# Patient Record
Sex: Male | Born: 1950
Health system: Southern US, Community
[De-identification: ages and names within clinical notes are randomized; demographics above are authoritative.]

## PROBLEM LIST (undated history)

## (undated) DIAGNOSIS — C189 Malignant neoplasm of colon, unspecified: Secondary | ICD-10-CM

## (undated) DIAGNOSIS — C20 Malignant neoplasm of rectum: Secondary | ICD-10-CM

## (undated) DIAGNOSIS — E119 Type 2 diabetes mellitus without complications: Secondary | ICD-10-CM

## (undated) DIAGNOSIS — C7800 Secondary malignant neoplasm of unspecified lung: Secondary | ICD-10-CM

## (undated) DIAGNOSIS — K219 Gastro-esophageal reflux disease without esophagitis: Secondary | ICD-10-CM

## (undated) DIAGNOSIS — J189 Pneumonia, unspecified organism: Secondary | ICD-10-CM

## (undated) DIAGNOSIS — R198 Other specified symptoms and signs involving the digestive system and abdomen: Secondary | ICD-10-CM

## (undated) DIAGNOSIS — G629 Polyneuropathy, unspecified: Secondary | ICD-10-CM

## (undated) DIAGNOSIS — Z7901 Long term (current) use of anticoagulants: Secondary | ICD-10-CM

## (undated) DIAGNOSIS — Z87442 Personal history of urinary calculi: Secondary | ICD-10-CM

## (undated) DIAGNOSIS — I2699 Other pulmonary embolism without acute cor pulmonale: Secondary | ICD-10-CM

## (undated) DIAGNOSIS — Z932 Ileostomy status: Principal | ICD-10-CM

## (undated) DIAGNOSIS — I739 Peripheral vascular disease, unspecified: Secondary | ICD-10-CM

## (undated) DIAGNOSIS — I10 Essential (primary) hypertension: Secondary | ICD-10-CM

## (undated) DIAGNOSIS — Z923 Personal history of irradiation: Secondary | ICD-10-CM

## (undated) DIAGNOSIS — I82409 Acute embolism and thrombosis of unspecified deep veins of unspecified lower extremity: Secondary | ICD-10-CM

## (undated) DIAGNOSIS — F329 Major depressive disorder, single episode, unspecified: Secondary | ICD-10-CM

## (undated) HISTORY — DX: Personal history of irradiation: Z92.3

## (undated) HISTORY — DX: Acute embolism and thrombosis of unspecified deep veins of unspecified lower extremity: I82.409

## (undated) HISTORY — DX: Other pulmonary embolism without acute cor pulmonale: I26.99

## (undated) HISTORY — PX: HERNIA REPAIR: SHX51

## (undated) HISTORY — DX: Ileostomy status: Z93.2

## (undated) HISTORY — DX: Other specified symptoms and signs involving the digestive system and abdomen: R19.8

## (undated) HISTORY — DX: Major depressive disorder, single episode, unspecified: F32.9

## (undated) HISTORY — DX: Essential (primary) hypertension: I10

## (undated) HISTORY — DX: Gastro-esophageal reflux disease without esophagitis: K21.9

## (undated) HISTORY — DX: Secondary malignant neoplasm of unspecified lung: C78.00

## (undated) HISTORY — DX: Malignant neoplasm of colon, unspecified: C18.9

## (undated) HISTORY — DX: Malignant neoplasm of rectum: C20

---

## 2010-07-04 HISTORY — PX: ESOPHAGOGASTRODUODENOSCOPY: SHX1529

## 2010-07-04 HISTORY — PX: COLONOSCOPY: SHX174

## 2010-07-11 ENCOUNTER — Ambulatory Visit (INDEPENDENT_AMBULATORY_CARE_PROVIDER_SITE_OTHER): Payer: Self-pay | Admitting: Gastroenterology

## 2010-07-11 ENCOUNTER — Encounter: Payer: Self-pay | Admitting: Gastroenterology

## 2010-07-11 DIAGNOSIS — K219 Gastro-esophageal reflux disease without esophagitis: Secondary | ICD-10-CM

## 2010-07-11 DIAGNOSIS — R197 Diarrhea, unspecified: Secondary | ICD-10-CM | POA: Insufficient documentation

## 2010-07-11 DIAGNOSIS — K625 Hemorrhage of anus and rectum: Secondary | ICD-10-CM

## 2010-07-13 ENCOUNTER — Encounter: Payer: Self-pay | Admitting: Gastroenterology

## 2010-07-16 ENCOUNTER — Telehealth (INDEPENDENT_AMBULATORY_CARE_PROVIDER_SITE_OTHER): Payer: Self-pay

## 2010-07-16 ENCOUNTER — Emergency Department (HOSPITAL_COMMUNITY)
Admission: EM | Admit: 2010-07-16 | Discharge: 2010-07-16 | Disposition: A | Discharge status: Home / Self Care | Payer: Self-pay | Attending: Emergency Medicine | Admitting: Emergency Medicine

## 2010-07-16 ENCOUNTER — Encounter: Payer: Self-pay | Admitting: Internal Medicine

## 2010-07-16 DIAGNOSIS — R0602 Shortness of breath: Secondary | ICD-10-CM | POA: Insufficient documentation

## 2010-07-16 DIAGNOSIS — R5381 Other malaise: Secondary | ICD-10-CM | POA: Insufficient documentation

## 2010-07-16 DIAGNOSIS — Z79899 Other long term (current) drug therapy: Secondary | ICD-10-CM | POA: Insufficient documentation

## 2010-07-16 DIAGNOSIS — R5383 Other fatigue: Secondary | ICD-10-CM | POA: Insufficient documentation

## 2010-07-16 DIAGNOSIS — K922 Gastrointestinal hemorrhage, unspecified: Secondary | ICD-10-CM | POA: Insufficient documentation

## 2010-07-16 DIAGNOSIS — I10 Essential (primary) hypertension: Secondary | ICD-10-CM | POA: Insufficient documentation

## 2010-07-16 LAB — CK TOTAL AND CKMB (NOT AT ARMC)
CK, MB: 0.8 ng/mL (ref 0.3–4.0)
Relative Index: INVALID (ref 0.0–2.5)
Total CK: 54 U/L (ref 7–232)

## 2010-07-16 LAB — BASIC METABOLIC PANEL
BUN: 14 mg/dL (ref 6–23)
CO2: 25 mEq/L (ref 19–32)
Calcium: 9.2 mg/dL (ref 8.4–10.5)
Chloride: 102 mEq/L (ref 96–112)
Creatinine, Ser: 0.73 mg/dL (ref 0.4–1.5)
GFR calc Af Amer: >60 mL/min (ref >60)
GFR calc non Af Amer: >60 mL/min (ref >60)
Glucose, Bld: 87 mg/dL (ref 70–99)
Potassium: 4.1 mEq/L (ref 3.5–5.1)
Sodium: 138 mEq/L (ref 135–145)

## 2010-07-16 LAB — TROPONIN I: Troponin I: 0.01 ng/mL (ref 0.00–0.06)

## 2010-07-16 LAB — DIFFERENTIAL
Basophils Absolute: 0.1 10*3/uL (ref 0.0–0.1)
Basophils Relative: 1 % (ref 0–1)
Eosinophils Absolute: 0.3 10*3/uL (ref 0.0–0.7)
Eosinophils Relative: 3 % (ref 0–5)
Lymphocytes Relative: 41 % (ref 12–46)
Lymphs Abs: 3.6 10*3/uL (ref 0.7–4.0)
Monocytes Absolute: 0.7 10*3/uL (ref 0.1–1.0)
Monocytes Relative: 8 % (ref 3–12)
Neutro Abs: 4.1 10*3/uL (ref 1.7–7.7)
Neutrophils Relative %: 47 % (ref 43–77)

## 2010-07-16 LAB — APTT: aPTT: 29 seconds (ref 24–37)

## 2010-07-16 LAB — CBC
HCT: 33.5 % — ABNORMAL LOW (ref 39.0–52.0)
Hemoglobin: 10.2 g/dL — ABNORMAL LOW (ref 13.0–17.0)
MCH: 21.2 pg — ABNORMAL LOW (ref 26.0–34.0)
MCHC: 30.4 g/dL (ref 30.0–36.0)
MCV: 69.5 fL — ABNORMAL LOW (ref 78.0–100.0)
Platelets: 339 10*3/uL (ref 150–400)
RBC: 4.82 MIL/uL (ref 4.22–5.81)
RDW: 15.7 % — ABNORMAL HIGH (ref 11.5–15.5)
WBC: 8.8 10*3/uL (ref 4.0–10.5)

## 2010-07-16 LAB — PROTIME-INR
INR: 0.92 (ref 0.00–1.49)
Prothrombin Time: 12.6 seconds (ref 11.6–15.2)

## 2010-07-16 NOTE — Assessment & Plan Note (Addendum)
Summary: diarrhea,constipation,vomiting   Vital Signs:  Patient profile:   60 year old male Height:      74 inches Weight:      208 pounds BMI:     26.80 Temp:     98 degrees F oral Pulse rate:   60 / minute BP sitting:   142 / 86  (left arm)  Vitals Entered By: Charles Clines LPN (July 11, 5954 9:16 AM)  Visit Type:  Initial Visit Primary Care Provider:  Vyas  Chief Complaint:  diarrhea.  History of Present Illness: Charles [name] is a pleasant 60 y/o WM, who presents as self-referral for further evaluation of chronic diarrhea. He states symptoms began in 10/10 after "stomach virus". He developed terrible diarrhea which has not went away. BM 5-6 stools per day, rarely solid. Spicey foods make diarrhea worse. Mucousy stool. BRBPR with wiping.  Wife sick then but eventually recoverd. She describes infection (likely CDiff) and required two round of Vancomycin. C/O abd "rolling". Started probiotics four weeks ago and abd pain improved but still with diarrhea. No weight loss. No recent labs or stool studies. Scheduled to have free screening TCS on 3/27, [facility]. Also c/o chronic GERD more than five years. When uncontrolled he has vomiting and difficulty swallowing. Never had EGD. Prilosec OTC once daily but with breakthrough symptoms.   Current Medications (verified): 1)  Extra Strength Probiotic .... Take One Qd 2)  Aspir-Low 81 Mg Tbec (Aspirin) .... Take One Once Daily 3)  Prilosec Otc 20 Mg Tbec (Omeprazole Magnesium) .... Take One Once Daily 4)  Metoprolol Tartrate 50 Mg Tabs (Metoprolol Tartrate) .... Take 1/2 Once Daily 5)  Flack Seed Oil .... Take One Tab Once Daily  Allergies (verified): No Known Drug Allergies  Past History:  Past Medical History: Hypertension  Past Surgical History: Unremarkable  Family History: No FH of chronic GI illnesses, IBD, CRC, liver disease.  Social History: Married. Two children. Hardwood/carpet installation, self-employed.  Never smoked. No alcohol. No drugs.   Review of Systems General:  Denies fever, chills, sweats, anorexia, fatigue, weakness, and weight loss. Eyes:  Denies vision loss. ENT:  Denies nasal congestion, sore throat, hoarseness, and difficulty swallowing. CV:  Denies chest pains, angina, palpitations, dyspnea on exertion, and peripheral edema. Resp:  Denies dyspnea at rest, dyspnea with exercise, cough, sputum, and wheezing. GI:  See HPI. GU:  Denies urinary burning and blood in urine. MS:  Denies joint pain / LOM. Derm:  Denies rash and itching. Neuro:  Denies weakness, frequent headaches, memory loss, and confusion. Psych:  Denies depression and anxiety. Endo:  Denies unusual weight change. Heme:  Denies bruising and bleeding. Allergy:  Denies hives and rash.  Physical Exam  General:  Pale appearing WM in NAD. Head:  Normocephalic and atraumatic. Eyes:  Conj pale. Sclera nonicteric. Mouth:  Oropharyngeal mucosa moist, pink.  No lesions, erythema or exudate.    Neck:  Supple; no masses or thyromegaly. Lungs:  Clear throughout to auscultation. Heart:  Regular rate and rhythm; no murmurs, rubs,  or bruits. Abdomen:  Bowel sounds normal.  Abdomen is soft, nontender, nondistended.  No rebound or guarding.  No hepatosplenomegaly, masses or hernias.  No abdominal bruits.  Rectal:  Patient declined. Will have done at time of TCS. Extremities:  No clubbing, cyanosis, edema or deformities noted. Neurologic:  Alert and  oriented x4;  grossly normal neurologically. Skin:  Intact without significant lesions or rashes. Cervical Nodes:  No significant cervical adenopathy. Psych:  Alert and cooperative. Normal mood and affect.   Impression & Recommendations:  Problem # 1:  DIARRHEA (ICD-787.91) Chronic diarrhea with intermittent brbpr. Symptoms began when his wife had what sounds like C.Diff. Ddx includes IBD, infectious etiology, CDiff, microscopic colitis, post-infectious IBS, celiac. Since he  is having free screening TCS at the end of the month in [place], we will check labs and stool studies now. Continue probiotics. Samples of Restora #14 given. If w/u negative and TCS clean, he may ultimately need FS with random bx and/or w/u for celiac. Orders: Consultation Level III [id number]) T-TSH [id number]) T-CBC w/Diff [id number]) T-Comprehensive Metabolic Panel [id number]) T-Culture, C-Diff Toxin A/B [id number]) T-Culture, Stool ([id number]) T-Stool Giardia / Crypto- EIA (02725)  Problem # 2:  GERD (ICD-530.81) Poorly controlled chronic GERD. Stop Prilosec. Begin Dexilant. Samples and instant savings card with RX provided. His copay should be $20 per month since he is uninsured. Advised that he should consider EGD to r/o Barrett's or other complications of GERD. At this time, he would like to postpone until diarrhea taken care of.  Orders: Consultation Level III (36644) Prescriptions: DEXILANT 60 MG CPDR (DEXLANSOPRAZOLE) one by mouth 30 mins before breakfast daily  #30 x 11   Entered and Authorized by:   Charles Battles. Dixon [name]   Signed by:   Charles Battles Chevez Sambrano PA-C on 07/11/2010   Method used:   Print then Give to Patient   RxID:   [id number]    Orders Added: 1)  Consultation Level III [99243] 2)  T-TSH [[id number]] 3)  T-CBC w/Diff [[id number]] 4)  T-Comprehensive Metabolic Panel [[id number]] 5)  T-Culture, C-Diff Toxin A/B [[id number]] 6)  T-Culture, Stool [[id number]] 7)  T-Stool Giardia / Crypto- EIA [20254]  Appended Document: diarrhea,constipation,vomiting Labs done 07/11/09, did not transfer into EMR.  WBC 8200, H/H 9.7/32.3, MCV 69.3, Plt 313,000. Glu 107, Cre 0.78, LFTs normal, TSH 0.845, C.diff negative other stools pending.   Patient is schedule to have screen TCS at end of month in [place] (free).  I would advise he undergo TCS +/- EGD ASAP given low Hgb. See if he would qualify for patient assistance through [facility].   Appended  Document: diarrhea,constipation,vomiting pts wife aware, she will call or go see Lubertha Basque tomorrow. Bettys phone number given.

## 2010-07-17 ENCOUNTER — Encounter: Payer: Self-pay | Admitting: Internal Medicine

## 2010-07-23 NOTE — Letter (Signed)
Summary: TCS/EGD ORDER  TCS/EGD ORDER   Imported By: Ave Filter 07/17/2010 13:35:37  _____________________________________________________________________  External Attachment:    Type:   Image     Comment:   External Document

## 2010-07-23 NOTE — Progress Notes (Signed)
Summary: weakness  Phone Note Call from Patient Call back at Home Phone [phone]   Caller: Spouse Summary of Call: pts wife called- stated pt was so weak this am that he was unable to put his clothes on. Spoke with LSL and she recomended that they proceed to the ED to be evaluated. Informed pts wife, she stated she would take him to [facility] ED now.  Initial call taken by: Hendricks Limes LPN,  July 16, 2010 8:35 AM     Appended Document: weakness Patient seen at [facility] ED. Hgb 10.2. Maroon stool with flecks of blood. Admission offered but pt declined. Please proceed with scheduling TCS/EGD ASAP.  Appended Document: weakness Per the pt request..Marland KitchenHe is scheduled for 07/24/10..Wife will come by to pick up instructions.Marland KitchenMarland Kitchen

## 2010-07-24 ENCOUNTER — Encounter: Payer: Self-pay | Admitting: Internal Medicine

## 2010-07-24 ENCOUNTER — Ambulatory Visit (HOSPITAL_COMMUNITY)
Admission: RE | Admit: 2010-07-24 | Discharge: 2010-07-24 | Disposition: A | Discharge status: Home / Self Care | Payer: Self-pay | Source: Ambulatory Visit | Attending: Internal Medicine | Admitting: Internal Medicine

## 2010-07-24 ENCOUNTER — Other Ambulatory Visit: Payer: Self-pay | Admitting: Internal Medicine

## 2010-07-24 DIAGNOSIS — R1310 Dysphagia, unspecified: Secondary | ICD-10-CM | POA: Insufficient documentation

## 2010-07-24 DIAGNOSIS — C20 Malignant neoplasm of rectum: Secondary | ICD-10-CM

## 2010-07-24 DIAGNOSIS — R197 Diarrhea, unspecified: Secondary | ICD-10-CM | POA: Insufficient documentation

## 2010-07-24 DIAGNOSIS — C189 Malignant neoplasm of colon, unspecified: Secondary | ICD-10-CM

## 2010-07-24 DIAGNOSIS — D129 Benign neoplasm of anus and anal canal: Secondary | ICD-10-CM

## 2010-07-24 DIAGNOSIS — Z7982 Long term (current) use of aspirin: Secondary | ICD-10-CM | POA: Insufficient documentation

## 2010-07-24 DIAGNOSIS — K625 Hemorrhage of anus and rectum: Secondary | ICD-10-CM | POA: Insufficient documentation

## 2010-07-24 DIAGNOSIS — D128 Benign neoplasm of rectum: Secondary | ICD-10-CM | POA: Insufficient documentation

## 2010-07-24 DIAGNOSIS — D509 Iron deficiency anemia, unspecified: Secondary | ICD-10-CM | POA: Insufficient documentation

## 2010-07-24 DIAGNOSIS — I10 Essential (primary) hypertension: Secondary | ICD-10-CM | POA: Insufficient documentation

## 2010-07-24 DIAGNOSIS — Z79899 Other long term (current) drug therapy: Secondary | ICD-10-CM | POA: Insufficient documentation

## 2010-07-24 DIAGNOSIS — K222 Esophageal obstruction: Secondary | ICD-10-CM

## 2010-07-24 DIAGNOSIS — K449 Diaphragmatic hernia without obstruction or gangrene: Secondary | ICD-10-CM | POA: Insufficient documentation

## 2010-07-24 HISTORY — DX: Malignant neoplasm of colon, unspecified: C18.9

## 2010-07-26 ENCOUNTER — Telehealth: Payer: Self-pay | Admitting: Internal Medicine

## 2010-07-27 NOTE — Telephone Encounter (Signed)
bx notification

## 2010-07-29 ENCOUNTER — Other Ambulatory Visit: Payer: Self-pay | Admitting: General Surgery

## 2010-07-29 DIAGNOSIS — C20 Malignant neoplasm of rectum: Secondary | ICD-10-CM

## 2010-07-30 NOTE — Op Note (Signed)
NAME:  Charles [name], Charles [name]            ACCOUNT NO.:  [id number]  MEDICAL RECORD NO.:  [id number]           PATIENT TYPE:  O  LOCATION:  DAYP                          FACILITY:  [facility]  PHYSICIAN:  R. Roetta Sessions, M.D. DATE OF BIRTH:  05/26/1950  DATE OF PROCEDURE:  07/24/2010 DATE OF DISCHARGE:                              OPERATIVE REPORT   EGD with Elease Hashimoto dilation followed by small bowel biopsy followed by colonoscopy with snare polypectomy with biopsy.  INDICATIONS FOR PROCEDURE:  A 60 year old gentleman with chronic diarrhea and intermittent blood per rectum, has a significant microcytic anemia and also reports esophageal dysphagia with solids intermittently, recently had been switched out from Prilosec, [facility] for reflux. Stool studies came back negative.  The patient reports intermittent esophageal dysphagia with solids particularly when reflux is exacerbated.  EGD with esophageal dilation, etc., is appropriate, and colonoscopy now being done.  Risks, benefits, limitations, alternatives, and imponderables have been reviewed.  INSTRUMENT:  Pentax video chip system.  EGD FINDINGS:  Examination of tubular esophagus revealed an Schatzki ring, otherwise esophageal mucosa appeared normal.  EG junction was easily traversed.  Stomach:  Gastric cavity was empty and insufflated well with air. Thorough examination of gastric mucosa including retroflexion of proximal stomach, esophagogastric junction demonstrated a small hiatal hernia only.  Pylorus is traversed easily.  Examination of bulb and second portion revealed no abnormalities.  THERAPEUTIC/DIAGNOSTIC MANEUVERS PERFORMED:  Scope was withdrawn.  A 60- French Maloney dilator was passed for full insertion, look back revealed no apparent complication related to passage of the dilator.  Subsequent biopsies of D2 and D3 were taken to rule out possibly celiac disease. The patient tolerated the procedure well, was prepared for  colonoscopy. Digital rectal exam revealed no abnormalities.  Endoscopic findings: Prep was adequate.  Examination of the rectal mucosa revealed a sprawling 2 x 3 cm carpet polyp that began 1 cm from the anal verge. Please see photos.  In the more proximal rectum beginning at 10 cm (not appreciated on digital rectal exam) was exophytic apple core neoplastic appearing process that extended approximately 4 cm initially was not able to negotiate the diagnostic colonoscope through this segment because of such a tight stenosis.  I backed off and got the pediatric colonoscope, I was able to get through this area and advanced the scope to the cecum.  Cecum, ileocecal valve, appendiceal orifice were seen and photographed for the record.  From this level, scope was slowly and cautiously withdrawn.  All previously mentioned mucosal surfaces were again seen.  The patient has had scattered pancolonic diverticula, otherwise colonic mucosa appeared normal.  Scope was pulled down into the rectum.  The apple core lesion start approximately 14 cm from theanal verge and was approximately 4 cm in length with the distal extent located at 10 cm from the anal verge.  Please see photos.  Remainder of more distal rectum was normal except for the 2-3 cm carpet polyp just inside the anal verge as described above.  Retroflexion was performed. Using jumbo biopsy forceps, the apple core lesions were biopsied multiple times and submitted to the pathologist.  Attention was then turned  to the distal rectal carpet polyp.  This was resected totally with multiple passes of the snare.  Please see photos.  Multiple specimens were submitted to the pathologist.  The patient tolerated the procedure well.  Cecal withdrawal time 25 minutes.  IMPRESSION: 1. EGD.  Schatzki ring status post dilation disruption described     above, otherwise unremarkable esophagus, small hiatal hernia,     otherwise normal stomach, D1 and D2 status  post biopsy, D2 and D3. 2. Colonoscopy findings:  2-3 cm distal rectal carpet polyp status     post piecemeal snare polypectomy. 3. Proximal rectal apple core neoplastic process 10-14 cm from the     anal verge not appreciated on digital rectal exam, status post     biopsy. 4. Pancolonic diverticula, otherwise normal colon.  Retrospect, I suspect the patient has been expressing spurious diarrhea around a high-grade partial obstruction of the proximal rectum.  RECOMMENDATIONS: 1. Stool softener with Colace 100 mg orally twice daily, and no     nonsteroidal agents for the next 7 days. 2. Low-residue diet. 3. Follow up on path. 4. Surgical consultation.     Jonathon Bellows, M.D.     RMR/MEDQ  D:  07/24/2010  T:  07/24/2010  Job:  [id number]  cc:   Doreen Beam, MD Fax: [phone]  Electronically Signed by Lorrin Goodell M.D. on 07/30/2010 11:23:47 AM

## 2010-07-31 ENCOUNTER — Ambulatory Visit
Admission: RE | Admit: 2010-07-31 | Discharge: 2010-07-31 | Disposition: A | Discharge status: Home / Self Care | Payer: No Typology Code available for payment source | Source: Ambulatory Visit | Attending: General Surgery | Admitting: General Surgery

## 2010-07-31 DIAGNOSIS — C20 Malignant neoplasm of rectum: Secondary | ICD-10-CM

## 2010-07-31 MED ORDER — IOHEXOL 300 MG/ML  SOLN
100.0000 mL | Freq: Once | INTRAMUSCULAR | Status: AC | PRN
  Administered 2010-07-31: 100 mL via INTRAVENOUS

## 2010-08-04 HISTORY — PX: EUS: SHX5427

## 2010-08-06 ENCOUNTER — Encounter (HOSPITAL_BASED_OUTPATIENT_CLINIC_OR_DEPARTMENT_OTHER): Payer: No Typology Code available for payment source | Admitting: Hematology and Oncology

## 2010-08-06 ENCOUNTER — Other Ambulatory Visit: Payer: Self-pay | Admitting: Hematology and Oncology

## 2010-08-06 DIAGNOSIS — I10 Essential (primary) hypertension: Secondary | ICD-10-CM

## 2010-08-06 DIAGNOSIS — C20 Malignant neoplasm of rectum: Secondary | ICD-10-CM

## 2010-08-06 DIAGNOSIS — R599 Enlarged lymph nodes, unspecified: Secondary | ICD-10-CM

## 2010-08-06 DIAGNOSIS — K219 Gastro-esophageal reflux disease without esophagitis: Secondary | ICD-10-CM

## 2010-08-06 LAB — COMPREHENSIVE METABOLIC PANEL
ALT: 9 U/L (ref 0–53)
AST: 10 U/L (ref 0–37)
Albumin: 4.1 g/dL (ref 3.5–5.2)
Alkaline Phosphatase: 77 U/L (ref 39–117)
BUN: 11 mg/dL (ref 6–23)
CO2: 23 mEq/L (ref 19–32)
Calcium: 8.7 mg/dL (ref 8.4–10.5)
Chloride: 105 mEq/L (ref 96–112)
Creatinine, Ser: 0.91 mg/dL (ref 0.40–1.50)
Glucose, Bld: 104 mg/dL — ABNORMAL HIGH (ref 70–99)
Potassium: 4.4 mEq/L (ref 3.5–5.3)
Sodium: 140 mEq/L (ref 135–145)
Total Bilirubin: 0.8 mg/dL (ref 0.3–1.2)
Total Protein: 6.5 g/dL (ref 6.0–8.3)

## 2010-08-06 LAB — CBC WITH DIFFERENTIAL/PLATELET
BASO%: 0.2 % (ref 0.0–2.0)
Basophils Absolute: 0 10*3/uL (ref 0.0–0.1)
EOS%: 1.3 % (ref 0.0–7.0)
Eosinophils Absolute: 0.1 10*3/uL (ref 0.0–0.5)
HCT: 32.3 % — ABNORMAL LOW (ref 38.4–49.9)
HGB: 10.1 g/dL — ABNORMAL LOW (ref 13.0–17.1)
LYMPH%: 23.7 % (ref 14.0–49.0)
MCH: 21 pg — ABNORMAL LOW (ref 27.2–33.4)
MCHC: 31.2 g/dL — ABNORMAL LOW (ref 32.0–36.0)
MCV: 67.2 fL — ABNORMAL LOW (ref 79.3–98.0)
MONO#: 0.5 10*3/uL (ref 0.1–0.9)
MONO%: 5.2 % (ref 0.0–14.0)
NEUT#: 7.2 10*3/uL — ABNORMAL HIGH (ref 1.5–6.5)
NEUT%: 69.6 % (ref 39.0–75.0)
Platelets: 380 10*3/uL (ref 140–400)
RBC: 4.81 10*6/uL (ref 4.20–5.82)
RDW: 17.2 % — ABNORMAL HIGH (ref 11.0–14.6)
WBC: 10.4 10*3/uL — ABNORMAL HIGH (ref 4.0–10.3)
lymph#: 2.5 10*3/uL (ref 0.9–3.3)

## 2010-08-06 LAB — CEA: CEA: 1.4 ng/mL (ref 0.0–5.0)

## 2010-08-07 ENCOUNTER — Ambulatory Visit: Payer: Self-pay | Attending: Radiation Oncology | Admitting: Radiation Oncology

## 2010-08-07 DIAGNOSIS — Z87442 Personal history of urinary calculi: Secondary | ICD-10-CM | POA: Insufficient documentation

## 2010-08-07 DIAGNOSIS — Z79899 Other long term (current) drug therapy: Secondary | ICD-10-CM | POA: Insufficient documentation

## 2010-08-07 DIAGNOSIS — Z8042 Family history of malignant neoplasm of prostate: Secondary | ICD-10-CM | POA: Insufficient documentation

## 2010-08-07 DIAGNOSIS — Z808 Family history of malignant neoplasm of other organs or systems: Secondary | ICD-10-CM | POA: Insufficient documentation

## 2010-08-07 DIAGNOSIS — Z510 Encounter for antineoplastic radiation therapy: Secondary | ICD-10-CM | POA: Insufficient documentation

## 2010-08-07 DIAGNOSIS — C19 Malignant neoplasm of rectosigmoid junction: Secondary | ICD-10-CM | POA: Insufficient documentation

## 2010-08-07 DIAGNOSIS — N289 Disorder of kidney and ureter, unspecified: Secondary | ICD-10-CM | POA: Insufficient documentation

## 2010-08-07 DIAGNOSIS — I10 Essential (primary) hypertension: Secondary | ICD-10-CM | POA: Insufficient documentation

## 2010-08-07 DIAGNOSIS — K219 Gastro-esophageal reflux disease without esophagitis: Secondary | ICD-10-CM | POA: Insufficient documentation

## 2010-08-12 ENCOUNTER — Encounter (HOSPITAL_COMMUNITY)
Admission: RE | Admit: 2010-08-12 | Discharge: 2010-08-12 | Disposition: A | Discharge status: Home / Self Care | Payer: Self-pay | Source: Ambulatory Visit | Attending: Hematology and Oncology | Admitting: Hematology and Oncology

## 2010-08-12 ENCOUNTER — Encounter (HOSPITAL_COMMUNITY): Payer: Self-pay

## 2010-08-12 ENCOUNTER — Other Ambulatory Visit: Payer: Self-pay | Admitting: Hematology and Oncology

## 2010-08-12 DIAGNOSIS — C20 Malignant neoplasm of rectum: Secondary | ICD-10-CM

## 2010-08-12 LAB — GLUCOSE, CAPILLARY: Glucose-Capillary: 125 mg/dL — ABNORMAL HIGH (ref 70–99)

## 2010-08-12 MED ORDER — FLUDEOXYGLUCOSE F - 18 (FDG) INJECTION
18.7000 | Freq: Once | INTRAVENOUS | Status: AC | PRN
  Administered 2010-08-12: 18.7 via INTRAVENOUS

## 2010-08-15 ENCOUNTER — Telehealth: Payer: Self-pay | Admitting: Gastroenterology

## 2010-08-16 ENCOUNTER — Telehealth: Payer: Self-pay

## 2010-08-16 DIAGNOSIS — C20 Malignant neoplasm of rectum: Secondary | ICD-10-CM

## 2010-08-16 NOTE — Telephone Encounter (Signed)
Pt instructed and meds reviewed he will call with any questions or concerns

## 2010-08-16 NOTE — Telephone Encounter (Signed)
Records never received I placed a call to [facility] at the cancer center and she will refax the records today.

## 2010-08-19 ENCOUNTER — Ambulatory Visit (HOSPITAL_COMMUNITY)
Admission: RE | Admit: 2010-08-19 | Discharge: 2010-08-19 | Disposition: A | Discharge status: Home / Self Care | Payer: Self-pay | Source: Ambulatory Visit | Attending: Gastroenterology | Admitting: Gastroenterology

## 2010-08-19 ENCOUNTER — Encounter: Payer: Self-pay | Admitting: Gastroenterology

## 2010-08-19 DIAGNOSIS — I10 Essential (primary) hypertension: Secondary | ICD-10-CM | POA: Insufficient documentation

## 2010-08-19 DIAGNOSIS — C20 Malignant neoplasm of rectum: Secondary | ICD-10-CM

## 2010-08-19 DIAGNOSIS — K219 Gastro-esophageal reflux disease without esophagitis: Secondary | ICD-10-CM | POA: Insufficient documentation

## 2010-08-20 ENCOUNTER — Ambulatory Visit (HOSPITAL_COMMUNITY)
Admission: RE | Admit: 2010-08-20 | Discharge: 2010-08-20 | Disposition: A | Discharge status: Home / Self Care | Payer: Self-pay | Source: Ambulatory Visit | Attending: Hematology and Oncology | Admitting: Hematology and Oncology

## 2010-08-20 DIAGNOSIS — C20 Malignant neoplasm of rectum: Secondary | ICD-10-CM

## 2010-08-21 ENCOUNTER — Telehealth: Payer: Self-pay | Admitting: Gastroenterology

## 2010-08-22 NOTE — Telephone Encounter (Signed)
Bray with Dr Dalene Carrow wanted to cx the EUS but I advised her it had already been completed.  I faxed her a copy to send to [facility]

## 2010-09-02 ENCOUNTER — Encounter (HOSPITAL_BASED_OUTPATIENT_CLINIC_OR_DEPARTMENT_OTHER): Payer: Self-pay | Admitting: Hematology and Oncology

## 2010-09-02 ENCOUNTER — Other Ambulatory Visit: Payer: Self-pay | Admitting: Hematology and Oncology

## 2010-09-02 DIAGNOSIS — C20 Malignant neoplasm of rectum: Secondary | ICD-10-CM

## 2010-09-02 DIAGNOSIS — Z5111 Encounter for antineoplastic chemotherapy: Secondary | ICD-10-CM

## 2010-09-02 LAB — CBC WITH DIFFERENTIAL/PLATELET
BASO%: 0.3 % (ref 0.0–2.0)
Basophils Absolute: 0 10*3/uL (ref 0.0–0.1)
EOS%: 1.5 % (ref 0.0–7.0)
Eosinophils Absolute: 0.2 10*3/uL (ref 0.0–0.5)
HCT: 33.6 % — ABNORMAL LOW (ref 38.4–49.9)
HGB: 10.1 g/dL — ABNORMAL LOW (ref 13.0–17.1)
LYMPH%: 31.8 % (ref 14.0–49.0)
MCH: 19.9 pg — ABNORMAL LOW (ref 27.2–33.4)
MCHC: 30.1 g/dL — ABNORMAL LOW (ref 32.0–36.0)
MCV: 66.3 fL — ABNORMAL LOW (ref 79.3–98.0)
MONO#: 0.8 10*3/uL (ref 0.1–0.9)
MONO%: 8.3 % (ref 0.0–14.0)
NEUT#: 5.8 10*3/uL (ref 1.5–6.5)
NEUT%: 58.1 % (ref 39.0–75.0)
Platelets: 360 10*3/uL (ref 140–400)
RBC: 5.07 10*6/uL (ref 4.20–5.82)
RDW: 15.9 % — ABNORMAL HIGH (ref 11.0–14.6)
WBC: 9.9 10*3/uL (ref 4.0–10.3)
lymph#: 3.2 10*3/uL (ref 0.9–3.3)
nRBC: 0 % (ref 0–0)

## 2010-09-02 LAB — COMPREHENSIVE METABOLIC PANEL
ALT: 10 U/L (ref 0–53)
AST: 12 U/L (ref 0–37)
Albumin: 3.8 g/dL (ref 3.5–5.2)
Alkaline Phosphatase: 74 U/L (ref 39–117)
BUN: 13 mg/dL (ref 6–23)
CO2: 21 mEq/L (ref 19–32)
Calcium: 9 mg/dL (ref 8.4–10.5)
Chloride: 107 mEq/L (ref 96–112)
Creatinine, Ser: 0.72 mg/dL (ref 0.40–1.50)
Glucose, Bld: 88 mg/dL (ref 70–99)
Potassium: 4.4 mEq/L (ref 3.5–5.3)
Sodium: 141 mEq/L (ref 135–145)
Total Bilirubin: 0.7 mg/dL (ref 0.3–1.2)
Total Protein: 6.2 g/dL (ref 6.0–8.3)

## 2010-09-03 ENCOUNTER — Encounter (HOSPITAL_BASED_OUTPATIENT_CLINIC_OR_DEPARTMENT_OTHER): Payer: Self-pay | Admitting: Hematology and Oncology

## 2010-09-03 DIAGNOSIS — C20 Malignant neoplasm of rectum: Secondary | ICD-10-CM

## 2010-09-09 ENCOUNTER — Other Ambulatory Visit: Payer: Self-pay | Admitting: Hematology and Oncology

## 2010-09-09 ENCOUNTER — Encounter (HOSPITAL_BASED_OUTPATIENT_CLINIC_OR_DEPARTMENT_OTHER): Payer: Self-pay | Admitting: Hematology and Oncology

## 2010-09-09 DIAGNOSIS — Z5111 Encounter for antineoplastic chemotherapy: Secondary | ICD-10-CM

## 2010-09-09 DIAGNOSIS — R197 Diarrhea, unspecified: Secondary | ICD-10-CM

## 2010-09-09 DIAGNOSIS — C20 Malignant neoplasm of rectum: Secondary | ICD-10-CM

## 2010-09-09 LAB — BASIC METABOLIC PANEL
BUN: 12 mg/dL (ref 6–23)
CO2: 24 mEq/L (ref 19–32)
Calcium: 9.3 mg/dL (ref 8.4–10.5)
Chloride: 100 mEq/L (ref 96–112)
Creatinine, Ser: 0.64 mg/dL (ref 0.40–1.50)
Glucose, Bld: 155 mg/dL — ABNORMAL HIGH (ref 70–99)
Potassium: 3.6 mEq/L (ref 3.5–5.3)
Sodium: 135 mEq/L (ref 135–145)

## 2010-09-09 LAB — CBC WITH DIFFERENTIAL/PLATELET
BASO%: 0.4 % (ref 0.0–2.0)
Basophils Absolute: 0 10*3/uL (ref 0.0–0.1)
EOS%: 1.1 % (ref 0.0–7.0)
Eosinophils Absolute: 0.1 10*3/uL (ref 0.0–0.5)
HCT: 32.8 % — ABNORMAL LOW (ref 38.4–49.9)
HGB: 9.9 g/dL — ABNORMAL LOW (ref 13.0–17.1)
LYMPH%: 23.9 % (ref 14.0–49.0)
MCH: 20.2 pg — ABNORMAL LOW (ref 27.2–33.4)
MCHC: 30.2 g/dL — ABNORMAL LOW (ref 32.0–36.0)
MCV: 66.8 fL — ABNORMAL LOW (ref 79.3–98.0)
MONO#: 0.8 10*3/uL (ref 0.1–0.9)
MONO%: 10.1 % (ref 0.0–14.0)
NEUT#: 4.8 10*3/uL (ref 1.5–6.5)
NEUT%: 64.5 % (ref 39.0–75.0)
Platelets: 311 10*3/uL (ref 140–400)
RBC: 4.91 10*6/uL (ref 4.20–5.82)
RDW: 16.4 % — ABNORMAL HIGH (ref 11.0–14.6)
WBC: 7.4 10*3/uL (ref 4.0–10.3)
lymph#: 1.8 10*3/uL (ref 0.9–3.3)
nRBC: 0 % (ref 0–0)

## 2010-09-13 ENCOUNTER — Encounter: Payer: Self-pay | Admitting: Hematology and Oncology

## 2010-09-16 ENCOUNTER — Encounter (HOSPITAL_BASED_OUTPATIENT_CLINIC_OR_DEPARTMENT_OTHER): Payer: Self-pay | Admitting: Hematology and Oncology

## 2010-09-16 ENCOUNTER — Other Ambulatory Visit: Payer: Self-pay | Admitting: Hematology and Oncology

## 2010-09-16 DIAGNOSIS — Z5111 Encounter for antineoplastic chemotherapy: Secondary | ICD-10-CM

## 2010-09-16 DIAGNOSIS — C20 Malignant neoplasm of rectum: Secondary | ICD-10-CM

## 2010-09-16 LAB — CBC WITH DIFFERENTIAL/PLATELET
BASO%: 0.4 % (ref 0.0–2.0)
Basophils Absolute: 0 10*3/uL (ref 0.0–0.1)
EOS%: 2.1 % (ref 0.0–7.0)
Eosinophils Absolute: 0.2 10*3/uL (ref 0.0–0.5)
HCT: 32.8 % — ABNORMAL LOW (ref 38.4–49.9)
HGB: 9.8 g/dL — ABNORMAL LOW (ref 13.0–17.1)
LYMPH%: 20.9 % (ref 14.0–49.0)
MCH: 20.4 pg — ABNORMAL LOW (ref 27.2–33.4)
MCHC: 29.9 g/dL — ABNORMAL LOW (ref 32.0–36.0)
MCV: 68.2 fL — ABNORMAL LOW (ref 79.3–98.0)
MONO#: 0.6 10*3/uL (ref 0.1–0.9)
MONO%: 8.4 % (ref 0.0–14.0)
NEUT#: 4.9 10*3/uL (ref 1.5–6.5)
NEUT%: 68.2 % (ref 39.0–75.0)
Platelets: 306 10*3/uL (ref 140–400)
RBC: 4.81 10*6/uL (ref 4.20–5.82)
RDW: 18 % — ABNORMAL HIGH (ref 11.0–14.6)
WBC: 7.1 10*3/uL (ref 4.0–10.3)
lymph#: 1.5 10*3/uL (ref 0.9–3.3)
nRBC: 0 % (ref 0–0)

## 2010-09-16 LAB — BASIC METABOLIC PANEL
BUN: 8 mg/dL (ref 6–23)
CO2: 21 mEq/L (ref 19–32)
Calcium: 8.5 mg/dL (ref 8.4–10.5)
Chloride: 108 mEq/L (ref 96–112)
Creatinine, Ser: 0.8 mg/dL (ref 0.40–1.50)
Glucose, Bld: 118 mg/dL — ABNORMAL HIGH (ref 70–99)
Potassium: 3.9 mEq/L (ref 3.5–5.3)
Sodium: 140 mEq/L (ref 135–145)

## 2010-09-17 ENCOUNTER — Encounter (HOSPITAL_BASED_OUTPATIENT_CLINIC_OR_DEPARTMENT_OTHER): Payer: Self-pay | Admitting: Hematology and Oncology

## 2010-09-17 DIAGNOSIS — D751 Secondary polycythemia: Secondary | ICD-10-CM

## 2010-09-17 DIAGNOSIS — K219 Gastro-esophageal reflux disease without esophagitis: Secondary | ICD-10-CM

## 2010-09-17 DIAGNOSIS — C20 Malignant neoplasm of rectum: Secondary | ICD-10-CM

## 2010-09-17 DIAGNOSIS — I10 Essential (primary) hypertension: Secondary | ICD-10-CM

## 2010-09-23 ENCOUNTER — Encounter (HOSPITAL_BASED_OUTPATIENT_CLINIC_OR_DEPARTMENT_OTHER): Payer: Self-pay | Admitting: Hematology and Oncology

## 2010-09-23 DIAGNOSIS — C20 Malignant neoplasm of rectum: Secondary | ICD-10-CM

## 2010-09-23 DIAGNOSIS — Z5111 Encounter for antineoplastic chemotherapy: Secondary | ICD-10-CM

## 2010-09-25 ENCOUNTER — Emergency Department (HOSPITAL_COMMUNITY)
Admission: EM | Admit: 2010-09-25 | Discharge: 2010-09-25 | Disposition: A | Discharge status: Home / Self Care | Payer: Self-pay | Attending: Emergency Medicine | Admitting: Emergency Medicine

## 2010-09-25 DIAGNOSIS — R5381 Other malaise: Secondary | ICD-10-CM | POA: Insufficient documentation

## 2010-09-25 DIAGNOSIS — R509 Fever, unspecified: Secondary | ICD-10-CM | POA: Insufficient documentation

## 2010-09-25 DIAGNOSIS — Z79899 Other long term (current) drug therapy: Secondary | ICD-10-CM | POA: Insufficient documentation

## 2010-09-25 DIAGNOSIS — R110 Nausea: Secondary | ICD-10-CM | POA: Insufficient documentation

## 2010-09-25 DIAGNOSIS — R197 Diarrhea, unspecified: Secondary | ICD-10-CM | POA: Insufficient documentation

## 2010-09-25 DIAGNOSIS — C20 Malignant neoplasm of rectum: Secondary | ICD-10-CM | POA: Insufficient documentation

## 2010-09-25 DIAGNOSIS — Z923 Personal history of irradiation: Secondary | ICD-10-CM | POA: Insufficient documentation

## 2010-09-25 DIAGNOSIS — I10 Essential (primary) hypertension: Secondary | ICD-10-CM | POA: Insufficient documentation

## 2010-09-25 LAB — CBC
HCT: 30.5 % — ABNORMAL LOW (ref 39.0–52.0)
Hemoglobin: 9.4 g/dL — ABNORMAL LOW (ref 13.0–17.0)
MCH: 20.8 pg — ABNORMAL LOW (ref 26.0–34.0)
MCHC: 30.8 g/dL (ref 30.0–36.0)
MCV: 67.5 fL — ABNORMAL LOW (ref 78.0–100.0)
Platelets: 309 10*3/uL (ref 150–400)
RBC: 4.52 MIL/uL (ref 4.22–5.81)
RDW: 18.8 % — ABNORMAL HIGH (ref 11.5–15.5)
WBC: 9.2 10*3/uL (ref 4.0–10.5)

## 2010-09-25 LAB — DIFFERENTIAL
Basophils Absolute: 0 10*3/uL (ref 0.0–0.1)
Basophils Relative: 0 % (ref 0–1)
Eosinophils Absolute: 0.1 10*3/uL (ref 0.0–0.7)
Eosinophils Relative: 1 % (ref 0–5)
Lymphocytes Relative: 13 % (ref 12–46)
Lymphs Abs: 1.2 10*3/uL (ref 0.7–4.0)
Monocytes Absolute: 1.1 10*3/uL — ABNORMAL HIGH (ref 0.1–1.0)
Monocytes Relative: 12 % (ref 3–12)
Neutro Abs: 6.8 10*3/uL (ref 1.7–7.7)
Neutrophils Relative %: 74 % (ref 43–77)

## 2010-09-25 LAB — BASIC METABOLIC PANEL
BUN: 7 mg/dL (ref 6–23)
CO2: 23 mEq/L (ref 19–32)
Calcium: 8.7 mg/dL (ref 8.4–10.5)
Chloride: 101 mEq/L (ref 96–112)
Creatinine, Ser: 0.6 mg/dL (ref 0.4–1.5)
GFR calc Af Amer: >60 mL/min (ref >60)
GFR calc non Af Amer: >60 mL/min (ref >60)
Glucose, Bld: 114 mg/dL — ABNORMAL HIGH (ref 70–99)
Potassium: 3.4 mEq/L — ABNORMAL LOW (ref 3.5–5.1)
Sodium: 136 mEq/L (ref 135–145)

## 2010-10-03 ENCOUNTER — Other Ambulatory Visit: Payer: Self-pay | Admitting: Hematology and Oncology

## 2010-10-03 ENCOUNTER — Encounter (HOSPITAL_BASED_OUTPATIENT_CLINIC_OR_DEPARTMENT_OTHER): Payer: Self-pay | Admitting: Hematology and Oncology

## 2010-10-03 DIAGNOSIS — K219 Gastro-esophageal reflux disease without esophagitis: Secondary | ICD-10-CM

## 2010-10-03 DIAGNOSIS — C20 Malignant neoplasm of rectum: Secondary | ICD-10-CM

## 2010-10-03 DIAGNOSIS — D751 Secondary polycythemia: Secondary | ICD-10-CM

## 2010-10-03 DIAGNOSIS — I10 Essential (primary) hypertension: Secondary | ICD-10-CM

## 2010-10-03 DIAGNOSIS — Z5111 Encounter for antineoplastic chemotherapy: Secondary | ICD-10-CM

## 2010-10-03 LAB — CBC WITH DIFFERENTIAL/PLATELET
BASO%: 0.4 % (ref 0.0–2.0)
Basophils Absolute: 0 10*3/uL (ref 0.0–0.1)
EOS%: 2.9 % (ref 0.0–7.0)
Eosinophils Absolute: 0.2 10*3/uL (ref 0.0–0.5)
HCT: 35.7 % — ABNORMAL LOW (ref 38.4–49.9)
HGB: 11.4 g/dL — ABNORMAL LOW (ref 13.0–17.1)
LYMPH%: 12.8 % — ABNORMAL LOW (ref 14.0–49.0)
MCH: 22.8 pg — ABNORMAL LOW (ref 27.2–33.4)
MCHC: 31.9 g/dL — ABNORMAL LOW (ref 32.0–36.0)
MCV: 71.4 fL — ABNORMAL LOW (ref 79.3–98.0)
MONO#: 0.4 10*3/uL (ref 0.1–0.9)
MONO%: 7.5 % (ref 0.0–14.0)
NEUT#: 4.3 10*3/uL (ref 1.5–6.5)
NEUT%: 76.4 % — ABNORMAL HIGH (ref 39.0–75.0)
Platelets: 459 10*3/uL — ABNORMAL HIGH (ref 140–400)
RBC: 5 10*6/uL (ref 4.20–5.82)
RDW: 25.5 % — ABNORMAL HIGH (ref 11.0–14.6)
WBC: 5.7 10*3/uL (ref 4.0–10.3)
lymph#: 0.7 10*3/uL — ABNORMAL LOW (ref 0.9–3.3)

## 2010-10-03 LAB — BASIC METABOLIC PANEL
BUN: 11 mg/dL (ref 6–23)
CO2: 26 mEq/L (ref 19–32)
Calcium: 9 mg/dL (ref 8.4–10.5)
Chloride: 105 mEq/L (ref 96–112)
Creatinine, Ser: 0.71 mg/dL (ref 0.40–1.50)
Glucose, Bld: 100 mg/dL — ABNORMAL HIGH (ref 70–99)
Potassium: 3.3 mEq/L — ABNORMAL LOW (ref 3.5–5.3)
Sodium: 142 mEq/L (ref 135–145)

## 2010-10-10 ENCOUNTER — Encounter (HOSPITAL_BASED_OUTPATIENT_CLINIC_OR_DEPARTMENT_OTHER): Payer: Self-pay | Admitting: Hematology and Oncology

## 2010-10-10 ENCOUNTER — Other Ambulatory Visit: Payer: Self-pay | Admitting: Physician Assistant

## 2010-10-10 DIAGNOSIS — C20 Malignant neoplasm of rectum: Secondary | ICD-10-CM

## 2010-10-10 DIAGNOSIS — Z5111 Encounter for antineoplastic chemotherapy: Secondary | ICD-10-CM

## 2010-10-10 LAB — COMPREHENSIVE METABOLIC PANEL
ALT: 15 U/L (ref 0–53)
AST: 10 U/L (ref 0–37)
Albumin: 3.5 g/dL (ref 3.5–5.2)
Alkaline Phosphatase: 68 U/L (ref 39–117)
BUN: 8 mg/dL (ref 6–23)
CO2: 21 mEq/L (ref 19–32)
Calcium: 8.5 mg/dL (ref 8.4–10.5)
Chloride: 107 mEq/L (ref 96–112)
Creatinine, Ser: 0.7 mg/dL (ref 0.50–1.35)
Glucose, Bld: 115 mg/dL — ABNORMAL HIGH (ref 70–99)
Potassium: 3.6 mEq/L (ref 3.5–5.3)
Sodium: 141 mEq/L (ref 135–145)
Total Bilirubin: 0.5 mg/dL (ref 0.3–1.2)
Total Protein: 5.7 g/dL — ABNORMAL LOW (ref 6.0–8.3)

## 2010-10-10 LAB — CBC WITH DIFFERENTIAL/PLATELET
BASO%: 0.3 % (ref 0.0–2.0)
Basophils Absolute: 0 10*3/uL (ref 0.0–0.1)
EOS%: 1.2 % (ref 0.0–7.0)
Eosinophils Absolute: 0.1 10*3/uL (ref 0.0–0.5)
HCT: 33.4 % — ABNORMAL LOW (ref 38.4–49.9)
HGB: 10.9 g/dL — ABNORMAL LOW (ref 13.0–17.1)
LYMPH%: 12.6 % — ABNORMAL LOW (ref 14.0–49.0)
MCH: 23.2 pg — ABNORMAL LOW (ref 27.2–33.4)
MCHC: 32.5 g/dL (ref 32.0–36.0)
MCV: 71.4 fL — ABNORMAL LOW (ref 79.3–98.0)
MONO#: 0.4 10*3/uL (ref 0.1–0.9)
MONO%: 6 % (ref 0.0–14.0)
NEUT#: 4.8 10*3/uL (ref 1.5–6.5)
NEUT%: 79.9 % — ABNORMAL HIGH (ref 39.0–75.0)
Platelets: 542 10*3/uL — ABNORMAL HIGH (ref 140–400)
RBC: 4.68 10*6/uL (ref 4.20–5.82)
RDW: 25.1 % — ABNORMAL HIGH (ref 11.0–14.6)
WBC: 6 10*3/uL (ref 4.0–10.3)
lymph#: 0.8 10*3/uL — ABNORMAL LOW (ref 0.9–3.3)

## 2010-10-14 ENCOUNTER — Other Ambulatory Visit: Payer: Self-pay | Admitting: Hematology and Oncology

## 2010-10-14 ENCOUNTER — Encounter (HOSPITAL_BASED_OUTPATIENT_CLINIC_OR_DEPARTMENT_OTHER): Payer: Medicaid Other | Admitting: Hematology and Oncology

## 2010-10-14 DIAGNOSIS — C20 Malignant neoplasm of rectum: Secondary | ICD-10-CM

## 2010-10-14 DIAGNOSIS — Z5111 Encounter for antineoplastic chemotherapy: Secondary | ICD-10-CM

## 2010-10-14 LAB — CBC WITH DIFFERENTIAL/PLATELET
BASO%: 0.4 % (ref 0.0–2.0)
Basophils Absolute: 0 10*3/uL (ref 0.0–0.1)
EOS%: 1.4 % (ref 0.0–7.0)
Eosinophils Absolute: 0.1 10*3/uL (ref 0.0–0.5)
HCT: 37.3 % — ABNORMAL LOW (ref 38.4–49.9)
HGB: 12 g/dL — ABNORMAL LOW (ref 13.0–17.1)
LYMPH%: 13.6 % — ABNORMAL LOW (ref 14.0–49.0)
MCH: 23.3 pg — ABNORMAL LOW (ref 27.2–33.4)
MCHC: 32.3 g/dL (ref 32.0–36.0)
MCV: 72 fL — ABNORMAL LOW (ref 79.3–98.0)
MONO#: 0.3 10*3/uL (ref 0.1–0.9)
MONO%: 5.8 % (ref 0.0–14.0)
NEUT#: 4.6 10*3/uL (ref 1.5–6.5)
NEUT%: 78.8 % — ABNORMAL HIGH (ref 39.0–75.0)
Platelets: 509 10*3/uL — ABNORMAL HIGH (ref 140–400)
RBC: 5.18 10*6/uL (ref 4.20–5.82)
RDW: 26.1 % — ABNORMAL HIGH (ref 11.0–14.6)
WBC: 5.8 10*3/uL (ref 4.0–10.3)
lymph#: 0.8 10*3/uL — ABNORMAL LOW (ref 0.9–3.3)

## 2010-10-14 LAB — COMPREHENSIVE METABOLIC PANEL
ALT: 13 U/L (ref 0–53)
AST: 10 U/L (ref 0–37)
Albumin: 3.9 g/dL (ref 3.5–5.2)
Alkaline Phosphatase: 76 U/L (ref 39–117)
BUN: 9 mg/dL (ref 6–23)
CO2: 24 mEq/L (ref 19–32)
Calcium: 9.4 mg/dL (ref 8.4–10.5)
Chloride: 106 mEq/L (ref 96–112)
Creatinine, Ser: 0.77 mg/dL (ref 0.50–1.35)
Glucose, Bld: 121 mg/dL — ABNORMAL HIGH (ref 70–99)
Potassium: 4.4 mEq/L (ref 3.5–5.3)
Sodium: 141 mEq/L (ref 135–145)
Total Bilirubin: 0.7 mg/dL (ref 0.3–1.2)
Total Protein: 6.4 g/dL (ref 6.0–8.3)

## 2010-10-16 ENCOUNTER — Encounter (HOSPITAL_BASED_OUTPATIENT_CLINIC_OR_DEPARTMENT_OTHER): Payer: Medicaid Other | Admitting: Hematology and Oncology

## 2010-10-16 ENCOUNTER — Other Ambulatory Visit: Payer: Self-pay | Admitting: Hematology and Oncology

## 2010-10-16 DIAGNOSIS — C20 Malignant neoplasm of rectum: Secondary | ICD-10-CM

## 2010-10-30 ENCOUNTER — Encounter (HOSPITAL_BASED_OUTPATIENT_CLINIC_OR_DEPARTMENT_OTHER): Payer: Medicaid Other | Admitting: Hematology and Oncology

## 2010-10-30 ENCOUNTER — Other Ambulatory Visit: Payer: Self-pay | Admitting: Hematology and Oncology

## 2010-10-30 ENCOUNTER — Ambulatory Visit (HOSPITAL_COMMUNITY)
Admission: RE | Admit: 2010-10-30 | Discharge: 2010-10-30 | Disposition: A | Discharge status: Home / Self Care | Payer: Self-pay | Source: Ambulatory Visit | Attending: Hematology and Oncology | Admitting: Hematology and Oncology

## 2010-10-30 DIAGNOSIS — C20 Malignant neoplasm of rectum: Secondary | ICD-10-CM | POA: Insufficient documentation

## 2010-10-30 DIAGNOSIS — E278 Other specified disorders of adrenal gland: Secondary | ICD-10-CM | POA: Insufficient documentation

## 2010-10-30 DIAGNOSIS — N289 Disorder of kidney and ureter, unspecified: Secondary | ICD-10-CM | POA: Insufficient documentation

## 2010-10-30 DIAGNOSIS — K6389 Other specified diseases of intestine: Secondary | ICD-10-CM | POA: Insufficient documentation

## 2010-10-30 LAB — CBC WITH DIFFERENTIAL/PLATELET
BASO%: 0.3 % (ref 0.0–2.0)
Basophils Absolute: 0 10*3/uL (ref 0.0–0.1)
EOS%: 1.5 % (ref 0.0–7.0)
Eosinophils Absolute: 0.1 10*3/uL (ref 0.0–0.5)
HCT: 36.7 % — ABNORMAL LOW (ref 38.4–49.9)
HGB: 11.9 g/dL — ABNORMAL LOW (ref 13.0–17.1)
LYMPH%: 15.6 % (ref 14.0–49.0)
MCH: 23.8 pg — ABNORMAL LOW (ref 27.2–33.4)
MCHC: 32.4 g/dL (ref 32.0–36.0)
MCV: 73.5 fL — ABNORMAL LOW (ref 79.3–98.0)
MONO#: 0.4 10*3/uL (ref 0.1–0.9)
MONO%: 6.9 % (ref 0.0–14.0)
NEUT#: 4.3 10*3/uL (ref 1.5–6.5)
NEUT%: 75.7 % — ABNORMAL HIGH (ref 39.0–75.0)
Platelets: 333 10*3/uL (ref 140–400)
RBC: 4.99 10*6/uL (ref 4.20–5.82)
RDW: 25.8 % — ABNORMAL HIGH (ref 11.0–14.6)
WBC: 5.7 10*3/uL (ref 4.0–10.3)
lymph#: 0.9 10*3/uL (ref 0.9–3.3)

## 2010-10-30 LAB — CMP (CANCER CENTER ONLY)
ALT(SGPT): 13 U/L (ref 10–47)
AST: 17 U/L (ref 11–38)
Albumin: 3.3 g/dL (ref 3.3–5.5)
Alkaline Phosphatase: 89 U/L — ABNORMAL HIGH (ref 26–84)
BUN, Bld: 9 mg/dL (ref 7–22)
CO2: 27 mEq/L (ref 18–33)
Calcium: 8.9 mg/dL (ref 8.0–10.3)
Chloride: 101 mEq/L (ref 98–108)
Creat: 0.6 mg/dl (ref 0.6–1.2)
Glucose, Bld: 116 mg/dL (ref 73–118)
Potassium: 4 mEq/L (ref 3.3–4.7)
Sodium: 136 mEq/L (ref 128–145)
Total Bilirubin: 1 mg/dl (ref 0.20–1.60)
Total Protein: 6.8 g/dL (ref 6.4–8.1)

## 2010-10-30 LAB — CEA: CEA: 0.7 ng/mL (ref 0.0–5.0)

## 2010-10-30 MED ORDER — IOHEXOL 300 MG/ML  SOLN
100.0000 mL | Freq: Once | INTRAMUSCULAR | Status: AC | PRN
  Administered 2010-10-30: 100 mL via INTRAVENOUS

## 2010-11-01 ENCOUNTER — Encounter (HOSPITAL_BASED_OUTPATIENT_CLINIC_OR_DEPARTMENT_OTHER): Payer: Medicaid Other | Admitting: Hematology and Oncology

## 2010-11-01 DIAGNOSIS — C19 Malignant neoplasm of rectosigmoid junction: Secondary | ICD-10-CM

## 2010-11-14 HISTORY — PX: COLON SURGERY: SHX602

## 2010-12-24 ENCOUNTER — Encounter (HOSPITAL_BASED_OUTPATIENT_CLINIC_OR_DEPARTMENT_OTHER): Payer: Medicaid Other | Admitting: Hematology and Oncology

## 2010-12-24 ENCOUNTER — Other Ambulatory Visit: Payer: Self-pay | Admitting: Hematology and Oncology

## 2010-12-24 DIAGNOSIS — C20 Malignant neoplasm of rectum: Secondary | ICD-10-CM

## 2010-12-24 DIAGNOSIS — E860 Dehydration: Secondary | ICD-10-CM

## 2010-12-24 LAB — COMPREHENSIVE METABOLIC PANEL
ALT: 13 U/L (ref 0–53)
AST: 9 U/L (ref 0–37)
Albumin: 3.3 g/dL — ABNORMAL LOW (ref 3.5–5.2)
Alkaline Phosphatase: 80 U/L (ref 39–117)
BUN: 6 mg/dL (ref 6–23)
CO2: 23 mEq/L (ref 19–32)
Calcium: 8 mg/dL — ABNORMAL LOW (ref 8.4–10.5)
Chloride: 99 mEq/L (ref 96–112)
Creatinine, Ser: 0.69 mg/dL (ref 0.50–1.35)
Glucose, Bld: 87 mg/dL (ref 70–99)
Potassium: 3.4 mEq/L — ABNORMAL LOW (ref 3.5–5.3)
Sodium: 134 mEq/L — ABNORMAL LOW (ref 135–145)
Total Bilirubin: 0.5 mg/dL (ref 0.3–1.2)
Total Protein: 5.6 g/dL — ABNORMAL LOW (ref 6.0–8.3)

## 2010-12-24 LAB — CBC WITH DIFFERENTIAL/PLATELET
BASO%: 0.3 % (ref 0.0–2.0)
Basophils Absolute: 0 10*3/uL (ref 0.0–0.1)
EOS%: 2.8 % (ref 0.0–7.0)
Eosinophils Absolute: 0.1 10*3/uL (ref 0.0–0.5)
HCT: 34.2 % — ABNORMAL LOW (ref 38.4–49.9)
HGB: 11.4 g/dL — ABNORMAL LOW (ref 13.0–17.1)
LYMPH%: 20.4 % (ref 14.0–49.0)
MCH: 25.5 pg — ABNORMAL LOW (ref 27.2–33.4)
MCHC: 33.3 g/dL (ref 32.0–36.0)
MCV: 76.6 fL — ABNORMAL LOW (ref 79.3–98.0)
MONO#: 0.4 10*3/uL (ref 0.1–0.9)
MONO%: 7.2 % (ref 0.0–14.0)
NEUT#: 3.5 10*3/uL (ref 1.5–6.5)
NEUT%: 69.3 % (ref 39.0–75.0)
Platelets: 340 10*3/uL (ref 140–400)
RBC: 4.46 10*6/uL (ref 4.20–5.82)
RDW: 17.8 % — ABNORMAL HIGH (ref 11.0–14.6)
WBC: 5.1 10*3/uL (ref 4.0–10.3)
lymph#: 1 10*3/uL (ref 0.9–3.3)

## 2010-12-24 LAB — CEA: CEA: 0.7 ng/mL (ref 0.0–5.0)

## 2010-12-27 ENCOUNTER — Ambulatory Visit
Admission: RE | Admit: 2010-12-27 | Discharge: 2010-12-27 | Disposition: A | Discharge status: Home / Self Care | Payer: Self-pay | Source: Ambulatory Visit | Attending: Radiation Oncology | Admitting: Radiation Oncology

## 2011-01-10 ENCOUNTER — Ambulatory Visit (HOSPITAL_COMMUNITY)
Admission: RE | Admit: 2011-01-10 | Discharge: 2011-01-10 | Disposition: A | Discharge status: Home / Self Care | Payer: Medicaid Other | Source: Ambulatory Visit | Attending: Hematology and Oncology | Admitting: Hematology and Oncology

## 2011-01-10 ENCOUNTER — Encounter (HOSPITAL_COMMUNITY): Payer: Self-pay

## 2011-01-10 DIAGNOSIS — C20 Malignant neoplasm of rectum: Secondary | ICD-10-CM | POA: Insufficient documentation

## 2011-01-10 DIAGNOSIS — N289 Disorder of kidney and ureter, unspecified: Secondary | ICD-10-CM | POA: Insufficient documentation

## 2011-01-10 DIAGNOSIS — A187 Tuberculosis of adrenal glands: Secondary | ICD-10-CM | POA: Insufficient documentation

## 2011-01-10 MED ORDER — IOHEXOL 300 MG/ML  SOLN
100.0000 mL | Freq: Once | INTRAMUSCULAR | Status: AC | PRN
  Administered 2011-01-10: 100 mL via INTRAVENOUS

## 2011-01-14 ENCOUNTER — Other Ambulatory Visit: Payer: Self-pay | Admitting: Hematology and Oncology

## 2011-01-14 ENCOUNTER — Encounter (HOSPITAL_BASED_OUTPATIENT_CLINIC_OR_DEPARTMENT_OTHER): Payer: Medicaid Other | Admitting: Hematology and Oncology

## 2011-01-14 DIAGNOSIS — C20 Malignant neoplasm of rectum: Secondary | ICD-10-CM

## 2011-01-14 DIAGNOSIS — R197 Diarrhea, unspecified: Secondary | ICD-10-CM

## 2011-01-14 LAB — COMPREHENSIVE METABOLIC PANEL
ALT: 16 U/L (ref 0–53)
AST: 14 U/L (ref 0–37)
Albumin: 4.1 g/dL (ref 3.5–5.2)
Alkaline Phosphatase: 79 U/L (ref 39–117)
BUN: 13 mg/dL (ref 6–23)
CO2: 21 mEq/L (ref 19–32)
Calcium: 9.4 mg/dL (ref 8.4–10.5)
Chloride: 97 mEq/L (ref 96–112)
Creatinine, Ser: 0.91 mg/dL (ref 0.50–1.35)
Glucose, Bld: 134 mg/dL — ABNORMAL HIGH (ref 70–99)
Potassium: 4.1 mEq/L (ref 3.5–5.3)
Sodium: 132 mEq/L — ABNORMAL LOW (ref 135–145)
Total Bilirubin: 0.9 mg/dL (ref 0.3–1.2)
Total Protein: 6.8 g/dL (ref 6.0–8.3)

## 2011-01-14 LAB — CBC WITH DIFFERENTIAL/PLATELET
BASO%: 0.5 % (ref 0.0–2.0)
Basophils Absolute: 0 10*3/uL (ref 0.0–0.1)
EOS%: 1.4 % (ref 0.0–7.0)
Eosinophils Absolute: 0.1 10*3/uL (ref 0.0–0.5)
HCT: 40.7 % (ref 38.4–49.9)
HGB: 13.4 g/dL (ref 13.0–17.1)
LYMPH%: 18.4 % (ref 14.0–49.0)
MCH: 25 pg — ABNORMAL LOW (ref 27.2–33.4)
MCHC: 32.8 g/dL (ref 32.0–36.0)
MCV: 76.4 fL — ABNORMAL LOW (ref 79.3–98.0)
MONO#: 0.5 10*3/uL (ref 0.1–0.9)
MONO%: 6.1 % (ref 0.0–14.0)
NEUT#: 5.6 10*3/uL (ref 1.5–6.5)
NEUT%: 73.6 % (ref 39.0–75.0)
Platelets: 354 10*3/uL (ref 140–400)
RBC: 5.33 10*6/uL (ref 4.20–5.82)
RDW: 16.6 % — ABNORMAL HIGH (ref 11.0–14.6)
WBC: 7.7 10*3/uL (ref 4.0–10.3)
lymph#: 1.4 10*3/uL (ref 0.9–3.3)

## 2011-01-15 ENCOUNTER — Encounter: Payer: Self-pay | Admitting: Gastroenterology

## 2011-01-15 ENCOUNTER — Ambulatory Visit (INDEPENDENT_AMBULATORY_CARE_PROVIDER_SITE_OTHER): Payer: Medicaid Other | Admitting: Gastroenterology

## 2011-01-15 VITALS — BP 115/81 | HR 108 | Temp 97.6°F | Ht 73.0 in | Wt 173.4 lb

## 2011-01-15 DIAGNOSIS — R197 Diarrhea, unspecified: Secondary | ICD-10-CM

## 2011-01-15 DIAGNOSIS — C20 Malignant neoplasm of rectum: Secondary | ICD-10-CM

## 2011-01-15 DIAGNOSIS — C189 Malignant neoplasm of colon, unspecified: Secondary | ICD-10-CM

## 2011-01-15 DIAGNOSIS — K219 Gastro-esophageal reflux disease without esophagitis: Secondary | ICD-10-CM

## 2011-01-15 DIAGNOSIS — R634 Abnormal weight loss: Secondary | ICD-10-CM

## 2011-01-15 HISTORY — DX: Malignant neoplasm of rectum: C20

## 2011-01-15 LAB — TSH: TSH: 1.219 u[IU]/mL (ref 0.350–4.500)

## 2011-01-15 LAB — T4, FREE: Free T4: 1.72 ng/dL (ref 0.80–1.80)

## 2011-01-15 MED ORDER — CHOLESTYRAMINE 4 G PO PACK: PACK | ORAL | Status: DC

## 2011-01-15 MED ORDER — DEXLANSOPRAZOLE 60 MG PO CPDR: 60.0000 mg | DELAYED_RELEASE_CAPSULE | Freq: Every day | ORAL | Status: DC

## 2011-01-16 ENCOUNTER — Encounter: Payer: Self-pay | Admitting: Gastroenterology

## 2011-01-16 LAB — GIARDIA/CRYPTOSPORIDIUM (EIA)
Cryptosporidium Screen (EIA): NEGATIVE
Giardia Screen (EIA): NEGATIVE

## 2011-01-16 LAB — CLOSTRIDIUM DIFFICILE BY PCR: Toxigenic C. Difficile by PCR: NOT DETECTED

## 2011-01-16 NOTE — Progress Notes (Signed)
Cc to PCP 

## 2011-01-16 NOTE — Progress Notes (Signed)
Please advise patient to avoid taking other medications within four hours of taking Questran. Consider checking INR one week after starting Questran.

## 2011-01-16 NOTE — Progress Notes (Signed)
Primary Care Physician: [name]., MD, MD  Primary Gastroenterologist:  Roetta Sessions, MD   Chief Complaint  Patient presents with  . Diarrhea    very water/wt loss 10 lb in 3 wks    HPI: Charles [name] is a 60 y.o. male here for further evaluation of diarrhea. Diagnosed with rectal adenocarcinoma in 07/2010. Took chemo/XRT prior to resection. Plans for Chemo for six months. Has had ongoing issues with diarrhea and dehydration. Chemo has been postponed. Receiving IV fluids on weekly basis. In 07/2010, he weighed 208 pounds, now 173 pounds.   Liquid fills his ostomy bag within 15-30 minutes after eats. Food comes through undigested. Liquid comes out of bag within minutes. Drinks a bottle of cold water and within 10 minutes he has cold water in his ostomy. Tried eating BRAT diet. Tried Lomotil, imodium metamucil, tylenol #3. Nothing seems to help. His oncologist is Dr. Dalene Carrow at [facility].   Current Outpatient Prescriptions  Medication Sig Dispense Refill  . acetaminophen-codeine (TYLENOL #3) 300-30 MG per tablet Take 1 tablet by mouth every 4 (four) hours as needed.        . ALPRAZolam (XANAX) 0.5 MG tablet Take 0.5 mg by mouth at bedtime as needed.        . citalopram (CELEXA) 20 MG tablet Take 20 mg by mouth daily.        Marland Kitchen dexlansoprazole (DEXILANT) 60 MG capsule Take 1 capsule (60 mg total) by mouth daily.  60 capsule  5  . LORazepam (ATIVAN) 2 MG tablet Take 2 mg by mouth every 6 (six) hours as needed.        . mirtazapine (REMERON) 15 MG tablet Take 15 mg by mouth at bedtime.        . Tamsulosin HCl (FLOMAX) 0.4 MG CAPS Take 0.4 mg by mouth daily.        Marland Kitchen warfarin (COUMADIN) 5 MG tablet Take 5 mg by mouth daily.        . cholestyramine (QUESTRAN) 4 G packet 1/2 to 1 packet bid with meals. Hold for constipation.  60 each  5    Allergies as of 01/15/2011  . (No Known Allergies)    ROS:  General: Negative for fever, chills. C/O  fatigue, weakness, weight loss. ENT:  Negative for hoarseness, difficulty swallowing , nasal congestion. CV: Negative for chest pain, angina, palpitations, dyspnea on exertion, peripheral edema.  Respiratory: Negative for dyspnea at rest, dyspnea on exertion, cough, sputum, wheezing.  GI: See history of present illness. GU:  Negative for dysuria, hematuria, urinary incontinence, urinary frequency, nocturnal urination.  Endo: 35 pound weight loss since 07/2010.    Physical Examination:   BP 115/81  Pulse 108  Temp(Src) 97.6 F (36.4 C) (Temporal)  Ht 6\' 1"  (1.854 m)  Wt 173 lb 6.4 oz (78.654 kg)  BMI 22.88 kg/m2  General: Thin, WM in NAD. Chronically ill-appearing. Eyes: No icterus. Mouth: Oropharyngeal mucosa moist and pink , no lesions erythema or exudate. Lungs: Clear to auscultation bilaterally.  Heart: Regular rate and rhythm, no murmurs rubs or gallops.  Abdomen: Bowel sounds are normal, nontender, nondistended, no hepatosplenomegaly or masses, no abdominal bruits or hernia , no rebound or guarding. Ileostomy in right lower abd 1/2 full with brown liquid (drank coffee prior to OV).  Extremities: No lower extremity edema. No clubbing or deformities. Neuro: Alert and oriented x 4   Skin: Warm and dry, no jaundice.   Psych: Alert and cooperative, normal mood and affect.  Labs:  Lab Results  Component Value Date   WBC 9.2 09/25/2010   HGB 13.4 01/14/2011   HCT 40.7 01/14/2011   MCV 76.4* 01/14/2011   PLT 354 01/14/2011    Lab Results  Component Value Date   ALT 16 01/14/2011   ALT 16 01/14/2011   AST 14 01/14/2011   AST 14 01/14/2011   ALKPHOS 79 01/14/2011   ALKPHOS 79 01/14/2011   BILITOT 0.9 01/14/2011   BILITOT 0.9 01/14/2011   Lab Results  Component Value Date   CREATININE 0.91 01/14/2011   CREATININE 0.91 01/14/2011   BUN 13 01/14/2011   BUN 13 01/14/2011   NA 132* 01/14/2011   NA 132* 01/14/2011   K 4.1 01/14/2011   K 4.1 01/14/2011   CL 97 01/14/2011   CL 97 01/14/2011   CO2 21 01/14/2011   CO2 21 01/14/2011     Lab Results  Component Value Date   CEA 0.7 12/24/2010   CEA 0.7 12/24/2010    Imaging Studies: Ct Chest W Contrast  01/10/2011  *RADIOLOGY REPORT*  Clinical Data:  History of rectal cancer.  Diagnosed 03/12. Chemotherapy and radiation therapy completed 6/12.  Rectal surgery with colostomy.  CT CHEST, ABDOMEN AND PELVIS WITH CONTRAST  Technique: Contiguous axial images of the chest abdomen and pelvis were obtained after IV contrast administration.  Contrast: 100  ml Omnipaque-300  Comparison: 10/30/2010 and PET of 08/12/2010.  CT CHEST  Findings: Lung windows demonstrate mild centrilobular emphysema. Resolved areas of ground-glass opacification within the lungs.  Soft tissue windows demonstrate a right-sided Port-A-Cath which terminates at the low SVC. Normal heart size without pericardial or pleural effusion.  No mediastinal or hilar adenopathy.  IMPRESSION:  1. No acute process or evidence of metastatic disease in the chest. 2.  Resolved areas of ground-glass opacity since the prior exam.  CT ABDOMEN AND PELVIS  Findings:  Normal liver, spleen, stomach, pancreas, gallbladder, biliary tract, right adrenal gland.  Left adrenal calcification is unchanged.  Normal right kidney.  A lower pole left renal lesion measures 1.0 cm, unchanged.  Fluid density on coronal image 61. Apparent increased density on transverse image 89 is felt to be artifactual / due to volume averaging.  IVC filter below the renal veins. No retroperitoneal or retrocrural adenopathy.  Interval resection of the previously described rectosigmoid mass. No evidence of locally recurrent disease.  Otherwise normal colon and terminal ileum.  Normal appendix.  Right lower quadrant loop ileostomy is also new.  Small ileocolic mesenteric nodes, likely reactive.  Normal caliber small bowel loops.  No ascites or omental/peritoneal disease.  No pelvic sidewall adenopathy.  Normal urinary bladder and prostate.  The perirectal nodes described previously  are no longer identified.  Perirectal fascial thickening is likely surgical or treatment related.  No evidence of postoperative fluid collection. No acute osseous abnormality.  IMPRESSION:  1.  Interval resection of the rectosigmoid primary and creation of right lower quadrant ileostomy.  No evidence of recurrent or metastatic disease. 2.  Question hepatic steatosis, mild. 3.  Left renal lesion is similar in size.  Favored to represent a small cyst. 4.  Left adrenal calcification, likely related to prior hemorrhage or infection.  Original Report Authenticated By: Consuello Bossier, M.D.

## 2011-01-16 NOTE — Assessment & Plan Note (Signed)
Diarrhea since LAR and ileostomy for rectal cancer. Received chemo/XRT prior to resection. Chemo on hold due to ongoing diarrhea/dehydration. Ongoing weight loss.  Rule out infection. Trial of Questran. Check thyroid status. Further recommendations to follow.

## 2011-01-16 NOTE — Progress Notes (Signed)
Quick Note:  Thyroid function okay. Await stool studies. ______

## 2011-01-16 NOTE — Assessment & Plan Note (Signed)
Continue Dexilant. Rebate coupon and RX given.

## 2011-01-16 NOTE — Progress Notes (Signed)
Quick Note:  Cdiff neg. Await stool culture. ______

## 2011-01-17 ENCOUNTER — Encounter (HOSPITAL_BASED_OUTPATIENT_CLINIC_OR_DEPARTMENT_OTHER): Payer: Medicaid Other | Admitting: Hematology and Oncology

## 2011-01-17 DIAGNOSIS — R197 Diarrhea, unspecified: Secondary | ICD-10-CM

## 2011-01-17 DIAGNOSIS — C20 Malignant neoplasm of rectum: Secondary | ICD-10-CM

## 2011-01-19 LAB — STOOL CULTURE

## 2011-01-20 ENCOUNTER — Telehealth: Payer: Self-pay | Admitting: Gastroenterology

## 2011-01-20 NOTE — Progress Notes (Signed)
Quick Note:  Please let pt know all stools were negative. Thyroid function is normal.  How is he doing on Questran? ______

## 2011-01-20 NOTE — Telephone Encounter (Signed)
Can try lansoprazole. Not sure what is on his drug plan. He will need to have INR checked around one week after starting lansoprazole.   If he is still having to empty bag frequently, make sure he is taking at least four grams of questran twice daily.   What dose is he on??

## 2011-01-20 NOTE — Telephone Encounter (Signed)
Pts wife called, he is still having diarrhea , they can not afford the dexilant, wants to know if there is something else we can call in for the pt that is not so expensive.

## 2011-01-20 NOTE — Telephone Encounter (Signed)
pts wife stated he was taking 1 packet of questran bid before a meal, that is what their directions say on the package. Pt is having watery diarrhea. She stated ok to send rx to [facility] in [place] for lansoprazole. Pt is going tomorrow to have inr done and she will let them know that he is starting new med when he goes.

## 2011-01-20 NOTE — Telephone Encounter (Signed)
Routing to LSL for review. Saw last week.

## 2011-01-21 NOTE — Telephone Encounter (Signed)
Spoke to wife. Patient still emptying bag 10-12 times. Stools trying to firm up in the evenings.   Advised him to take Questran 4g at 8am, noon, 4pm. He will take morning meds at 6am and evening meals at 7pm or later.   He will call Thurs with [employer].  They said he got Medicaid. Want [place] to fill out P.A. For Dexilant. Please start forms.

## 2011-01-21 NOTE — Progress Notes (Signed)
Please see 01/20/11 phone note

## 2011-01-21 NOTE — Telephone Encounter (Signed)
PA forms have been started.

## 2011-01-22 ENCOUNTER — Encounter (HOSPITAL_BASED_OUTPATIENT_CLINIC_OR_DEPARTMENT_OTHER): Payer: Medicaid Other | Admitting: Hematology and Oncology

## 2011-01-22 DIAGNOSIS — R197 Diarrhea, unspecified: Secondary | ICD-10-CM

## 2011-01-22 DIAGNOSIS — C20 Malignant neoplasm of rectum: Secondary | ICD-10-CM

## 2011-01-23 ENCOUNTER — Telehealth: Payer: Self-pay

## 2011-01-23 NOTE — Telephone Encounter (Signed)
Pt's wife called and his stools are oatmeal consistency in the Morning but after he eats and drinks it gets back to watery diarrhea. He is still taking the Questran as directed. Please advise!

## 2011-01-24 ENCOUNTER — Telehealth: Payer: Self-pay | Admitting: Internal Medicine

## 2011-01-24 ENCOUNTER — Encounter (HOSPITAL_BASED_OUTPATIENT_CLINIC_OR_DEPARTMENT_OTHER): Payer: Medicaid Other | Admitting: Hematology and Oncology

## 2011-01-24 DIAGNOSIS — R197 Diarrhea, unspecified: Secondary | ICD-10-CM

## 2011-01-24 DIAGNOSIS — C20 Malignant neoplasm of rectum: Secondary | ICD-10-CM

## 2011-01-24 NOTE — Telephone Encounter (Signed)
Discussed with Dr. Jena Gauss.  He recommends changing therapy for diarrhea a little bit.  Take Questran 4g (one packet) twice daily, try 8am, 4pm and not within two hours of other meds.  He is decreasing dose of questran b/c he wants to add Lomotil one tablet TID. Call in RX for #90, no refills.  Anytime his meds are adjusted he should let coumadin clinic know.  Also need to get op note from [facility] regarding colon surgery.  Please bring this patient in the office to see an extender on a day while RMR is in office seeing patients. Please make sure this appt is the first week RMR is back from vacation.

## 2011-01-24 NOTE — Telephone Encounter (Signed)
See note from RMR

## 2011-01-24 NOTE — Telephone Encounter (Signed)
Yes; make him a Monday or Thursday appt

## 2011-01-27 NOTE — Telephone Encounter (Signed)
Pt's wife is aware of OV on 02/10/11 at 0900 with LSL on a RMR day

## 2011-01-27 NOTE — Telephone Encounter (Signed)
Pt's wife is aware of OV for 02/10/11 at 0900 with LSL (RMR DAY)

## 2011-01-27 NOTE — Telephone Encounter (Signed)
Pt's wife is aware of OV with LSL on a RMR day on 10/8 at 0900

## 2011-01-27 NOTE — Telephone Encounter (Signed)
pts wife aware. rx called to [facility]. Pt has ov with LSL on 02/10/11 at 9am. pts wife said she would try her best to get him here. Pt starts chemo the week prior to this and she is not sure if he will be sick or not. She will call if needed.    Benedetto Goad, please get op note from [facility].

## 2011-01-28 NOTE — Telephone Encounter (Signed)
Requested.

## 2011-01-29 ENCOUNTER — Encounter (HOSPITAL_BASED_OUTPATIENT_CLINIC_OR_DEPARTMENT_OTHER): Payer: Medicaid Other | Admitting: Hematology and Oncology

## 2011-01-29 DIAGNOSIS — R197 Diarrhea, unspecified: Secondary | ICD-10-CM

## 2011-01-29 DIAGNOSIS — C20 Malignant neoplasm of rectum: Secondary | ICD-10-CM

## 2011-01-31 ENCOUNTER — Encounter (HOSPITAL_BASED_OUTPATIENT_CLINIC_OR_DEPARTMENT_OTHER): Payer: Medicaid Other | Admitting: Hematology and Oncology

## 2011-01-31 DIAGNOSIS — C20 Malignant neoplasm of rectum: Secondary | ICD-10-CM

## 2011-01-31 DIAGNOSIS — R197 Diarrhea, unspecified: Secondary | ICD-10-CM

## 2011-02-04 ENCOUNTER — Other Ambulatory Visit (HOSPITAL_COMMUNITY): Payer: Self-pay | Admitting: Oncology

## 2011-02-04 ENCOUNTER — Other Ambulatory Visit: Payer: Self-pay | Admitting: Hematology and Oncology

## 2011-02-04 ENCOUNTER — Ambulatory Visit (HOSPITAL_BASED_OUTPATIENT_CLINIC_OR_DEPARTMENT_OTHER): Payer: Medicaid Other | Admitting: Hematology and Oncology

## 2011-02-04 DIAGNOSIS — Z23 Encounter for immunization: Secondary | ICD-10-CM

## 2011-02-04 DIAGNOSIS — C189 Malignant neoplasm of colon, unspecified: Secondary | ICD-10-CM

## 2011-02-04 DIAGNOSIS — C20 Malignant neoplasm of rectum: Secondary | ICD-10-CM

## 2011-02-04 DIAGNOSIS — I10 Essential (primary) hypertension: Secondary | ICD-10-CM

## 2011-02-04 DIAGNOSIS — K219 Gastro-esophageal reflux disease without esophagitis: Secondary | ICD-10-CM

## 2011-02-04 DIAGNOSIS — C19 Malignant neoplasm of rectosigmoid junction: Secondary | ICD-10-CM

## 2011-02-04 LAB — COMPREHENSIVE METABOLIC PANEL
ALT: 29 U/L (ref 0–53)
AST: 20 U/L (ref 0–37)
Albumin: 3 g/dL — ABNORMAL LOW (ref 3.5–5.2)
Alkaline Phosphatase: 80 U/L (ref 39–117)
BUN: 8 mg/dL (ref 6–23)
CO2: 22 mEq/L (ref 19–32)
Calcium: 8.8 mg/dL (ref 8.4–10.5)
Chloride: 102 mEq/L (ref 96–112)
Creatinine, Ser: 0.73 mg/dL (ref 0.50–1.35)
Glucose, Bld: 123 mg/dL — ABNORMAL HIGH (ref 70–99)
Potassium: 3.8 mEq/L (ref 3.5–5.3)
Sodium: 134 mEq/L — ABNORMAL LOW (ref 135–145)
Total Bilirubin: 0.7 mg/dL (ref 0.3–1.2)
Total Protein: 6.4 g/dL (ref 6.0–8.3)

## 2011-02-04 LAB — CBC WITH DIFFERENTIAL/PLATELET
BASO%: 1 % (ref 0.0–2.0)
Basophils Absolute: 0.1 10*3/uL (ref 0.0–0.1)
EOS%: 2.3 % (ref 0.0–7.0)
Eosinophils Absolute: 0.1 10*3/uL (ref 0.0–0.5)
HCT: 34.7 % — ABNORMAL LOW (ref 38.4–49.9)
HGB: 11.6 g/dL — ABNORMAL LOW (ref 13.0–17.1)
LYMPH%: 16.3 % (ref 14.0–49.0)
MCH: 25.4 pg — ABNORMAL LOW (ref 27.2–33.4)
MCHC: 33.5 g/dL (ref 32.0–36.0)
MCV: 76 fL — ABNORMAL LOW (ref 79.3–98.0)
MONO#: 0.5 10*3/uL (ref 0.1–0.9)
MONO%: 7 % (ref 0.0–14.0)
NEUT#: 4.8 10*3/uL (ref 1.5–6.5)
NEUT%: 73.4 % (ref 39.0–75.0)
Platelets: 282 10*3/uL (ref 140–400)
RBC: 4.57 10*6/uL (ref 4.20–5.82)
RDW: 18.5 % — ABNORMAL HIGH (ref 11.0–14.6)
WBC: 6.5 10*3/uL (ref 4.0–10.3)
lymph#: 1.1 10*3/uL (ref 0.9–3.3)

## 2011-02-04 NOTE — Progress Notes (Signed)
This office note has been dictated.

## 2011-02-05 ENCOUNTER — Encounter (HOSPITAL_BASED_OUTPATIENT_CLINIC_OR_DEPARTMENT_OTHER): Payer: Medicaid Other | Admitting: Hematology and Oncology

## 2011-02-05 DIAGNOSIS — C20 Malignant neoplasm of rectum: Secondary | ICD-10-CM

## 2011-02-05 DIAGNOSIS — Z5111 Encounter for antineoplastic chemotherapy: Secondary | ICD-10-CM

## 2011-02-07 ENCOUNTER — Encounter (HOSPITAL_BASED_OUTPATIENT_CLINIC_OR_DEPARTMENT_OTHER): Payer: Medicaid Other | Admitting: Hematology and Oncology

## 2011-02-07 DIAGNOSIS — Z5189 Encounter for other specified aftercare: Secondary | ICD-10-CM

## 2011-02-07 DIAGNOSIS — C20 Malignant neoplasm of rectum: Secondary | ICD-10-CM

## 2011-02-10 ENCOUNTER — Encounter (HOSPITAL_COMMUNITY): Payer: Self-pay | Admitting: Emergency Medicine

## 2011-02-10 ENCOUNTER — Ambulatory Visit (INDEPENDENT_AMBULATORY_CARE_PROVIDER_SITE_OTHER): Payer: Medicaid Other | Admitting: Gastroenterology

## 2011-02-10 ENCOUNTER — Telehealth: Payer: Self-pay

## 2011-02-10 ENCOUNTER — Encounter: Payer: Self-pay | Admitting: Gastroenterology

## 2011-02-10 ENCOUNTER — Emergency Department (HOSPITAL_COMMUNITY)
Admission: EM | Admit: 2011-02-10 | Discharge: 2011-02-10 | Disposition: A | Discharge status: Home / Self Care | Payer: Medicaid Other | Attending: Emergency Medicine | Admitting: Emergency Medicine

## 2011-02-10 VITALS — BP 70/40 | HR 84 | Temp 97.0°F | Ht 73.0 in | Wt 178.0 lb

## 2011-02-10 DIAGNOSIS — I951 Orthostatic hypotension: Secondary | ICD-10-CM

## 2011-02-10 DIAGNOSIS — R197 Diarrhea, unspecified: Secondary | ICD-10-CM

## 2011-02-10 DIAGNOSIS — Z79899 Other long term (current) drug therapy: Secondary | ICD-10-CM | POA: Insufficient documentation

## 2011-02-10 DIAGNOSIS — C20 Malignant neoplasm of rectum: Secondary | ICD-10-CM | POA: Insufficient documentation

## 2011-02-10 DIAGNOSIS — C189 Malignant neoplasm of colon, unspecified: Secondary | ICD-10-CM

## 2011-02-10 DIAGNOSIS — Z86711 Personal history of pulmonary embolism: Secondary | ICD-10-CM | POA: Insufficient documentation

## 2011-02-10 DIAGNOSIS — I959 Hypotension, unspecified: Secondary | ICD-10-CM | POA: Insufficient documentation

## 2011-02-10 DIAGNOSIS — R42 Dizziness and giddiness: Secondary | ICD-10-CM | POA: Insufficient documentation

## 2011-02-10 MED ORDER — ONDANSETRON HCL 4 MG/2ML IJ SOLN
4.0000 mg | Freq: Once | INTRAMUSCULAR | Status: DC
  Filled 2011-02-10: qty 2

## 2011-02-10 MED ORDER — HEPARIN SOD (PORK) LOCK FLUSH 100 UNIT/ML IV SOLN
INTRAVENOUS | Status: AC
  Filled 2011-02-10: qty 5

## 2011-02-10 MED ORDER — SODIUM CHLORIDE 0.9 % IV BOLUS (SEPSIS)
1000.0000 mL | Freq: Once | INTRAVENOUS | Status: AC
  Administered 2011-02-10: 1000 mL via INTRAVENOUS

## 2011-02-10 MED ORDER — HEPARIN SOD (PORK) LOCK FLUSH 100 UNIT/ML IV SOLN
500.0000 [IU] | Freq: Once | INTRAVENOUS | Status: AC
  Administered 2011-02-10: 500 [IU] via INTRAVENOUS

## 2011-02-10 NOTE — ED Notes (Signed)
Accessed pre-existing power port.  nad noted

## 2011-02-10 NOTE — Assessment & Plan Note (Signed)
Orthostatic hypotension possibly due to hypovolemia. Patient has persistent hyponatremia, orthostatic dizziness, high volume ostomy output. H/O chronic diarrhea prior to dx of CRC. Stool studies have been negative. He is not responding to Questran, tylenol with codeine, lomotil. TSH recently normal.  Patient seen today in conjunction with Dr. Jena Gauss. Need to r/o adrenal insufficiency. Will arrange for patient to [facility]. If labs unrevealing, next step may include Hydrogen Breath Test and/or trial of pancreatic enzymes.   Patient will go to ED if symptoms worsen. No driving. Needs to continue to push the fluids and take it easy today.

## 2011-02-10 NOTE — Patient Instructions (Addendum)
Please have your blood work done at [facility]. Nothing to eat or drink after midnight. We are working to get you some IV fluids for tomorrow. Our office will call you with details this afternoon. If you feels worse, with increase dizziness, weakness-->go to ER.

## 2011-02-10 NOTE — ED Notes (Signed)
Pt a chemo pt. Received epogen shot Friday and was advised of sx's such as dizzines and tingling. Pt c/o dizziness started yesterday and tingling that lasted approx to L side of nose. Pt seen pcp today and bp was 70 systolic and sent here for fluids. Pt denies pain. Color wnl. Chemo for rectal cancer. nad at this time.

## 2011-02-10 NOTE — ED Notes (Signed)
Pt left er stating no needs 

## 2011-02-10 NOTE — Progress Notes (Signed)
Cc to PCP 

## 2011-02-10 NOTE — Telephone Encounter (Signed)
Discussed with Dr. Jena Gauss. Orders for normal saline 2 liters to be given over 3 hours provided. Check vital signs per protocol.  Orders given to [facility] for faxing.

## 2011-02-10 NOTE — ED Notes (Signed)
Family at bedside. Patient is just ready for discharge.

## 2011-02-10 NOTE — Telephone Encounter (Signed)
Orders were faxed to the [facility]. Confirmed with Angie that they were received.

## 2011-02-10 NOTE — Progress Notes (Signed)
Primary Care Physician: [name]., MD, MD  Primary Gastroenterologist:  Roetta Sessions, MD   Chief Complaint  Patient presents with  . Diarrhea  . Weakness  . Dizziness    HPI: Charles [name] is a 60 y.o. male here for f/u of severe diarrhea. He had proctectomy with colorectal anastomosis and diverting loop ileostomy on 11/14/10 for rectal adenocarcinoma.  Seen 01/15/11 for increased ostomy output and recurrent dehydration. Stool studies negative. Started on Questran and lomotil. Has required IV fluids for orthostatic hypotension/dehydration. Chemo started last week. Chemo every other week on W/T/F. Two cycles per month for 4-5 months. Transferring treatments to [facility]. Only new medication other than chemo and antidiarrheals is Flomax. Notably, I saw the patient in 07/2010 for chronic diarrhea and this was prior to diagnosis of rectal cancer.   Still having watery stools. Food goes straight into bag within minutes of eating/drinking. Empties bag 8-10 times during the day and 2-3 times per night. Questran 4g BID. Lomotil TID. Surgeon told him to expect emptying bag 4-5 per day. No abdominal pain. C/O recurrent dizziness. Noted yesterday and today. Appetite real good. Eats all the time. Shot to get WBC up on Friday, flu-like symptoms. No n/v. No melena, brbpr. No heartburn.  Weight up from 173 to 178.  Current Outpatient Prescriptions  Medication Sig Dispense Refill  . acetaminophen-codeine (TYLENOL #3) 300-30 MG per tablet Take 1 tablet by mouth every 4 (four) hours as needed.        . ALPRAZolam (XANAX) 0.5 MG tablet Take 0.5 mg by mouth at bedtime as needed.        . cholestyramine (QUESTRAN) 4 G packet 1 packet 2 (two) times daily. Do not take within two hours of other medications       . citalopram (CELEXA) 20 MG tablet Take 20 mg by mouth daily.        Marland Kitchen dexamethasone (DECADRON) 4 MG tablet Take 4 mg by mouth 2 (two) times daily with a meal.        . dexlansoprazole  (DEXILANT) 60 MG capsule Take 1 capsule (60 mg total) by mouth daily.  60 capsule  5  . diphenoxylate-atropine (LOMOTIL) 2.5-0.025 MG per tablet Take 1 tablet by mouth 3 (three) times daily.        Marland Kitchen LORazepam (ATIVAN) 2 MG tablet Take 2 mg by mouth every 6 (six) hours as needed.        . mirtazapine (REMERON) 15 MG tablet Take 15 mg by mouth at bedtime.        . ondansetron (ZOFRAN) 8 MG tablet Take by mouth every 8 (eight) hours as needed.        . Tamsulosin HCl (FLOMAX) 0.4 MG CAPS Take 0.4 mg by mouth daily.        Marland Kitchen warfarin (COUMADIN) 5 MG tablet Take 5 mg by mouth daily.          Allergies as of 02/10/2011  . (No Known Allergies)    ROS:  General: Negative for anorexia, weight loss, fever, chills. C/O fatigue, weakness. ENT: Negative for hoarseness, difficulty swallowing , nasal congestion. CV: Negative for chest pain, angina, palpitations, dyspnea on exertion, peripheral edema.  Respiratory: Negative for dyspnea at rest, dyspnea on exertion, cough, sputum, wheezing.  GI: See history of present illness. GU:  Negative for dysuria, hematuria, urinary incontinence, urinary frequency, nocturnal urination.  Endo: Negative for unusual weight change.    Physical Examination:   BP 70/40  Pulse  84  Temp(Src) 97 F (36.1 C) (Temporal)  Ht 6\' 1"  (1.854 m)  Wt 178 lb (80.74 kg)  BMI 23.48 kg/m2 see additional orthostatic vitals signs for this encounter  General: Well-nourished, well-developed in no acute distress. Laying down due to dizziness. Eyes: No icterus. Mouth: Oropharyngeal mucosa moist and pink , no lesions erythema or exudate. Lungs: Clear to auscultation bilaterally.  Heart: Regular rate and rhythm, no murmurs rubs or gallops.  Abdomen: Bowel sounds are normal, nontender, nondistended, no hepatosplenomegaly or masses, no abdominal bruits or hernia , no rebound or guarding.  Ostomy bag full of black coffee.  Extremities: No lower extremity edema. No clubbing or  deformities. Neuro: Alert and oriented x 4   Skin: Warm and dry, no jaundice.   Psych: Alert and cooperative, normal mood and affect.  Labs:  Lab Results  Component Value Date   CREATININE 0.73 02/04/2011   CREATININE 0.73 02/04/2011   BUN 8 02/04/2011   BUN 8 02/04/2011   NA 134* 02/04/2011   NA 134* 02/04/2011   K 3.8 02/04/2011   K 3.8 02/04/2011   CL 102 02/04/2011   CL 102 02/04/2011   CO2 22 02/04/2011   CO2 22 02/04/2011   Lab Results  Component Value Date   WBC 9.2 09/25/2010   HGB 11.6* 02/04/2011   HCT 34.7* 02/04/2011   MCV 76.0* 02/04/2011   PLT 282 02/04/2011   Lab Results  Component Value Date   ALT 29 02/04/2011   ALT 29 02/04/2011   AST 20 02/04/2011   AST 20 02/04/2011   ALKPHOS 80 02/04/2011   ALKPHOS 80 02/04/2011   BILITOT 0.7 02/04/2011   BILITOT 0.7 02/04/2011

## 2011-02-10 NOTE — Assessment & Plan Note (Signed)
Continue current regimen for now. See assessment and plan for orthostatic dehydration.

## 2011-02-10 NOTE — Telephone Encounter (Signed)
Spoke with patient. Feeling okay. Aware of plan for fluids tomorrow. I instructed him to take it easy. No driving. If dizziness gets worse, go to ED for fluids. Voiced understanding.

## 2011-02-10 NOTE — Telephone Encounter (Signed)
Called and spoke with [place]. Pt is scheduled to get IV fluids for dehydration on 02/11/2011 at 10:15 AM. He is to arrive at 10:00 AM. Tana Coast, PA will make out the orders and fax to [phone]. I informed pt's wife of the time and place.

## 2011-02-10 NOTE — ED Notes (Signed)
Family at bedside. 

## 2011-02-10 NOTE — ED Provider Notes (Signed)
History     CSN: [id number] Arrival date & time: 02/10/2011  3:41 PM  Chief Complaint  Patient presents with  . Hypotension  . Dizziness    (Consider location/radiation/quality/duration/timing/severity/associated sxs/prior treatment) HPI Patient is status post surgery on 11/14/2010 [facility] for rectal cancer. A colostomy was placed at that time. He has had chronic diarrhea after procedure leading to bouts of dehydration and hypotension. Presently on chemotherapy. He felt weak and blood pressure was noted to be low. He has dealt with this in the past. IV fluids usually help. He prefers not to have a blood work. He wants only IV fluids. Past Medical History  Diagnosis Date  . Colon cancer     rectal ca  . HTN (hypertension)   . Pulmonary embolism   . GERD (gastroesophageal reflux disease)     Past Surgical History  Procedure Date  . Esophagogastroduodenoscopy 07/2010    schatki ring s/p dilation, small hh, SB bx benign  . Colonoscopy 07/2010    proximal rectal apple core mass 10-14cm from anal verge (adenocarcinoma), 2-3cm distal rectal carpet polyp s/p piecemeal snare polypectomy (adenoma)  . Eus 08/2010    Dr. Rob Bunting. uT3N0 circumferential, nearly obstruction rectosigmoid adenocarcinoma, distal edge 12cm from anal verge  . Ivc filter   . Colon surgery 11/14/2010    proctectomy with colorectal anastomosis and diverting loop ileostomy (temporary planned)  . Port a cath placement     Family History  Problem Relation Age of Onset  . Colon cancer Neg Hx   . Liver disease Neg Hx   . Inflammatory bowel disease Neg Hx     History  Substance Use Topics  . Smoking status: Never Smoker   . Smokeless tobacco: Not on file  . Alcohol Use: No      Review of Systems  All other systems reviewed and are negative.    Allergies  Review of patient's allergies indicates no known allergies.  Home Medications   Current Outpatient Rx  Name Route Sig Dispense Refill    . ACETAMINOPHEN 500 MG PO TABS Oral Take 500 mg by mouth as needed. For pain     . ACETAMINOPHEN-CODEINE #3 300-30 MG PO TABS Oral Take 1 tablet by mouth every 4 (four) hours as needed. For diarrhea.    . ALPRAZOLAM 0.5 MG PO TABS Oral Take 0.5 mg by mouth 3 (three) times daily as needed. For anxiety    . CHOLESTYRAMINE 4 G PO PACK  1 packet 2 (two) times daily. To help with Diarrhea .**Do not take within two hours of other medications    . CITALOPRAM HYDROBROMIDE 20 MG PO TABS Oral Take 20 mg by mouth every morning. For Depression    . DEXAMETHASONE 4 MG PO TABS Oral Take 4 mg by mouth daily. For 3 days following chemotherapy treatments    . DEXLANSOPRAZOLE 60 MG PO CPDR Oral Take 60 mg by mouth every morning. Acid reflux     . DIPHENOXYLATE-ATROPINE 2.5-0.025 MG PO TABS Oral Take 1 tablet by mouth 3 (three) times daily. For diarrhea    . LORAZEPAM 2 MG PO TABS Oral Take 2 mg by mouth at bedtime as needed. For sleep    . ONDANSETRON HCL 8 MG PO TABS Oral Take 8 mg by mouth every 8 (eight) hours as needed. For nausea. **Starts the day of Chemo and follows 2 days after. May take in between as needed for nausea**    . TAMSULOSIN HCL 0.4 MG PO  CAPS Oral Take 0.4 mg by mouth every morning. For prostate health    . WARFARIN SODIUM 5 MG PO TABS Oral Take 5 mg by mouth daily. Except take 7.5mg  (one & one-half tablets) one Tuesdays and Fridays. Takes at 8pm (blood Thinner)    . SODIUM CHLORIDE 0.9 % IV SOLN Intravenous Inject into the vein continuous.        BP 110/68  Pulse 60  Temp(Src) 97.4 F (36.3 C) (Oral)  Resp 18  SpO2 100%  Physical Exam  Nursing note and vitals reviewed. Constitutional: He is oriented to person, place, and time. He appears well-developed and well-nourished.       Looks well. slightly hypotensive  HENT:  Head: Normocephalic and atraumatic.  Eyes: Conjunctivae and EOM are normal. Pupils are equal, round, and reactive to light.  Neck: Normal range of motion. Neck  supple.  Cardiovascular: Normal rate and regular rhythm.   Pulmonary/Chest: Effort normal and breath sounds normal.  Abdominal: Soft. Bowel sounds are normal.       Colostomy bag draining liquid brown stool  Musculoskeletal: Normal range of motion.  Neurological: He is alert and oriented to person, place, and time.  Skin: Skin is warm and dry.  Psychiatric: He has a normal mood and affect.    ED Course  Procedures (including critical care time)  Labs Reviewed - No data to display No results found. Will hydrate with 2 L of normal saline  1. Diarrhea       MDM  Patient is alert and oriented at discharge.  He feels much better after IV fluids. He will followup with his doctor in the morning        Donnetta Hutching, MD 02/10/11 [phone]

## 2011-02-11 ENCOUNTER — Ambulatory Visit (HOSPITAL_COMMUNITY): Payer: Medicaid Other

## 2011-02-11 LAB — BASIC METABOLIC PANEL
BUN: 9 mg/dL (ref 6–23)
CO2: 23 mEq/L (ref 19–32)
Calcium: 8.4 mg/dL (ref 8.4–10.5)
Chloride: 112 mEq/L (ref 96–112)
Creat: 0.83 mg/dL (ref 0.50–1.35)
Glucose, Bld: 73 mg/dL (ref 70–99)
Potassium: 3.6 mEq/L (ref 3.5–5.3)
Sodium: 141 mEq/L (ref 135–145)

## 2011-02-12 LAB — CORTISOL-AM, BLOOD: Cortisol - AM: 12.6 ug/dL (ref 4.3–22.4)

## 2011-02-13 ENCOUNTER — Telehealth: Payer: Self-pay | Admitting: Gastroenterology

## 2011-02-13 NOTE — Progress Notes (Signed)
Quick Note:  Please let patient know his labs are normal. His sodium is much better. Creatinine normal which would indicate NO dehydration. Adrenal glands appear to be working based on normal cortisol level.   Tell patient, I will show labs to RMR. We will likely try him on pancreatic enzymes. ______

## 2011-02-13 NOTE — Telephone Encounter (Signed)
Pts wife called wanting lab results- she also stated pt received 2 bags of IV fluids Monday evening and is still weak- they can be reached at [phone]

## 2011-02-13 NOTE — Telephone Encounter (Signed)
Routed to LSL 

## 2011-02-13 NOTE — Progress Notes (Signed)
Quick Note:  Just realized the blood collection date and time would imply the cortisol level was done inappropriately. Please verify when patient had his blood work done and if was fasting (no liquids or food from midnight on). ______

## 2011-02-14 NOTE — Progress Notes (Signed)
Quick Note:  Discussed with RMR. He wants patient to try the following for possible SB bacterial overgrowth.  Flagyl 500 mg po TID for 7 days. Cipro 500 mg po bid for 7 days.  Have him call next week with progress report. ______

## 2011-02-14 NOTE — Progress Notes (Signed)
Quick Note:  These labs were actually drawn after patient received IV fluids the evening before. This explains why they look so good this time. Let patient know his cortisol level was ok. I will discuss trial of antibiotic for bacterial overgrowth vs pancreatic enzymes with Dr. Jena Gauss. Unfortunately, if he continues to feel weak, he should consider ED for more IV fluids as it may take few days for medications to make any difference. Other option is to call Dr. Thornton Papas office and let them know (maybe they can get him in today for fluids) Keep appt with Cancer Center for Monday as planned. Hopefully once he has been seen by Dr. Thornton Papas office for his next chemo, they can arrange for fluid replacement as needed.  ______

## 2011-02-14 NOTE — Telephone Encounter (Signed)
Addressed under result note for labs.

## 2011-02-17 ENCOUNTER — Telehealth: Payer: Self-pay | Admitting: Internal Medicine

## 2011-02-17 ENCOUNTER — Encounter (HOSPITAL_COMMUNITY): Payer: Medicaid Other | Attending: Hematology and Oncology

## 2011-02-17 VITALS — BP 126/77 | HR 96 | Temp 97.6°F | Ht 73.0 in | Wt 183.2 lb

## 2011-02-17 DIAGNOSIS — Z79899 Other long term (current) drug therapy: Secondary | ICD-10-CM | POA: Insufficient documentation

## 2011-02-17 DIAGNOSIS — C20 Malignant neoplasm of rectum: Secondary | ICD-10-CM | POA: Insufficient documentation

## 2011-02-17 LAB — CBC
HCT: 34.3 % — ABNORMAL LOW (ref 39.0–52.0)
Hemoglobin: 11.1 g/dL — ABNORMAL LOW (ref 13.0–17.0)
MCH: 25.1 pg — ABNORMAL LOW (ref 26.0–34.0)
MCHC: 32.4 g/dL (ref 30.0–36.0)
MCV: 77.4 fL — ABNORMAL LOW (ref 78.0–100.0)
Platelets: 237 10*3/uL (ref 150–400)
RBC: 4.43 MIL/uL (ref 4.22–5.81)
RDW: 17.4 % — ABNORMAL HIGH (ref 11.5–15.5)
WBC: 11.5 10*3/uL — ABNORMAL HIGH (ref 4.0–10.5)

## 2011-02-17 LAB — DIFFERENTIAL
Basophils Absolute: 0 10*3/uL (ref 0.0–0.1)
Basophils Relative: 0 % (ref 0–1)
Eosinophils Absolute: 0.1 10*3/uL (ref 0.0–0.7)
Eosinophils Relative: 1 % (ref 0–5)
Lymphocytes Relative: 14 % (ref 12–46)
Lymphs Abs: 1.6 10*3/uL (ref 0.7–4.0)
Monocytes Absolute: 0.8 10*3/uL (ref 0.1–1.0)
Monocytes Relative: 7 % (ref 3–12)
Neutro Abs: 8.8 10*3/uL — ABNORMAL HIGH (ref 1.7–7.7)
Neutrophils Relative %: 77 % (ref 43–77)

## 2011-02-17 LAB — COMPREHENSIVE METABOLIC PANEL
ALT: 28 U/L (ref 0–53)
AST: 14 U/L (ref 0–37)
Albumin: 3 g/dL — ABNORMAL LOW (ref 3.5–5.2)
Alkaline Phosphatase: 117 U/L (ref 39–117)
BUN: 7 mg/dL (ref 6–23)
CO2: 25 mEq/L (ref 19–32)
Calcium: 8.5 mg/dL (ref 8.4–10.5)
Chloride: 102 mEq/L (ref 96–112)
Creatinine, Ser: 0.63 mg/dL (ref 0.50–1.35)
GFR calc Af Amer: >90 mL/min (ref >90)
GFR calc non Af Amer: >90 mL/min (ref >90)
Glucose, Bld: 86 mg/dL (ref 70–99)
Potassium: 4.1 mEq/L (ref 3.5–5.1)
Sodium: 136 mEq/L (ref 135–145)
Total Bilirubin: 0.4 mg/dL (ref 0.3–1.2)
Total Protein: 6.1 g/dL (ref 6.0–8.3)

## 2011-02-17 MED ORDER — SODIUM CHLORIDE 0.9 % IV SOLN: 2000.0000 mL | INTRAVENOUS | Status: DC

## 2011-02-17 NOTE — Telephone Encounter (Signed)
HAS QUESTIONS ABOUT TAKING MEDICATION/PLEASE ADVISE

## 2011-02-17 NOTE — Progress Notes (Signed)
This office note has been dictated.

## 2011-02-17 NOTE — Telephone Encounter (Signed)
Answered pt's questions about medications. 

## 2011-02-17 NOTE — Progress Notes (Signed)
CC:   Radene Gunning, M.D., Ph.D. Doreen Beam, MD R. Roetta Sessions, MD FACP Netta Cedars, MD  IDENTIFYING STATEMENT:  Patient is a 60 year old man with rectal cancer who presents for followup.  INTERVAL HISTORY:  Mr. Goines has transitioned his care from the [place] campus to the [place] campus to be closer to home.  He received cycle 1 of FOLFOX-6 (dose reduced 20%).  He required IV fluids but surprisingly did much better than I thought he would. Reports average energy levels and is able to complete all his ADLs.  He has gained weight.  He is eating well.  He denies pain.  He continues to have diarrhea and was evaluated in the ER at [facility].  He is on a course of antibiotics consisting of Flagyl and ciprofloxacin for a week. He denies oxaliplatin induced sensory/motor neuropathy.  He denies bursitis.  He also denies hand-foot syndrome.  MEDICATIONS:  Reviewed and recorded in nurse intake sheet.  ALLERGIES:  None.  SOCIAL HISTORY:  The patient resides in [place].  He is married with 2 children.  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  He denies alcohol or tobacco use.  PAST MEDICAL HISTORY:  Stage III rectal cancer, hypertension, chronic gastroesophageal reflux disease, history of kidney stones.  FAMILY HISTORY:  Patient's brother had throat cancer.  Another brother had prostate cancer.  No family history for colon cancer.  REVIEW OF SYSTEMS:  As above and rest of review of systems negative.  PHYSICAL EXAMINATION:  The patient is a well-appearing, well-nourished man in no distress.  Vitals:  Pulse 96, blood pressure 126/77, temperature 97.6, respirations 20, weight 183 (180).  HEENT:  Head is atraumatic, normocephalic.  Sclerae anicteric.  Mouth is moist without thrush.  Neck:  Supple.  Chest:  Clear.  Port-A-Cath without infection. CVS:  Unremarkable.  Abdomen:  Soft, non tender.  Bowel sounds present. Extremities:  No  edema.  CNS:  Non focal.  Skin:  No Petechiae.  LABORATORY DATA:  Pending.  IMPRESSION AND PLAN:  Mr. Bedoya is a 60 year old man with history of a uT3 N0 low-lying moderately differentiated adenocarcinoma of the rectum initially diagnosed on 07/24/2010.  He went on to receive radiation concurrently with continuous infusion 5-FU from 09/09/2010 through 10/10/2010.  He then went to [facility] and Dr. Rae Halsted performed a proctectomy with colorectal anastomosis and diverting loop ileostomy on 11/14/2010.  Surgical pathology revealed a moderately differentiated invasive adenocarcinoma with 3/20 positive lymph nodes. The tumor was pathologically staged as a T3b N1.  Mr. Poffenberger has chronic diarrhea likely secondary to short bowel.  He began his first cycle of FOLFOX-6 on October 3 at [facility]. Chemotherapy was dose reduced by 20%.  He seems to be doing well.  He will receive the second cycle on October 17.  He will return 2 days later for pump discontinuation and Neulasta shot.  I spent more than half the time coordinating care.  All questions were answered.  He follows up in 2 weeks' time.    ______________________________ Laurice Record, M.D. LIO/MEDQ  D:  02/17/2011  T:  02/17/2011  Job:  [id number]

## 2011-02-19 ENCOUNTER — Encounter (HOSPITAL_BASED_OUTPATIENT_CLINIC_OR_DEPARTMENT_OTHER): Payer: Medicaid Other

## 2011-02-19 VITALS — BP 120/78 | HR 95 | Temp 97.3°F | Ht 72.0 in | Wt 183.0 lb

## 2011-02-19 DIAGNOSIS — C20 Malignant neoplasm of rectum: Secondary | ICD-10-CM

## 2011-02-19 DIAGNOSIS — C189 Malignant neoplasm of colon, unspecified: Secondary | ICD-10-CM

## 2011-02-19 DIAGNOSIS — Z5111 Encounter for antineoplastic chemotherapy: Secondary | ICD-10-CM

## 2011-02-19 MED ORDER — LEUCOVORIN CALCIUM INJECTION 350 MG: 660.0000 mg | Freq: Once | INTRAVENOUS | Status: DC

## 2011-02-19 MED ORDER — FLUOROURACIL CHEMO INJECTION 2.5 GM/50ML
700.0000 mg | Freq: Once | INTRAVENOUS | Status: AC
  Administered 2011-02-19: 700 mg via INTRAVENOUS
  Filled 2011-02-19 (×2): qty 14

## 2011-02-19 MED ORDER — LEUCOVORIN CALCIUM INJECTION 100 MG
20.0000 mg/m2 | Freq: Once | INTRAMUSCULAR | Status: AC
  Administered 2011-02-19: 42 mg via INTRAVENOUS
  Filled 2011-02-19 (×2): qty 2.1

## 2011-02-19 MED ORDER — OXALIPLATIN CHEMO INJECTION 100 MG/20ML
140.0000 mg | Freq: Once | INTRAVENOUS | Status: AC
  Administered 2011-02-19: 140 mg via INTRAVENOUS
  Filled 2011-02-19 (×2): qty 28

## 2011-02-19 MED ORDER — SODIUM CHLORIDE 0.9 % IV SOLN
Freq: Once | INTRAVENOUS | Status: AC
  Administered 2011-02-19: 8 mg via INTRAVENOUS
  Filled 2011-02-19: qty 4

## 2011-02-19 MED ORDER — SODIUM CHLORIDE 0.9 % IV SOLN
INTRAVENOUS | Status: DC
  Administered 2011-02-19: 12:00:00 via INTRAVENOUS

## 2011-02-19 MED ORDER — DEXTROSE 5 % IV BOLUS
50.0000 mL | INTRAVENOUS | Status: AC
  Filled 2011-02-19 (×2): qty 50

## 2011-02-19 MED ORDER — ONDANSETRON 8 MG/50ML IVPB (CHCC): 8.0000 mg | Freq: Once | INTRAVENOUS | Status: DC

## 2011-02-19 MED ORDER — DEXTROSE 5 % IV SOLN
Freq: Once | INTRAVENOUS | Status: AC
  Administered 2011-02-19: 13:00:00 via INTRAVENOUS
  Filled 2011-02-19: qty 1000

## 2011-02-19 MED ORDER — DEXAMETHASONE SODIUM PHOSPHATE 10 MG/ML IJ SOLN: 10.0000 mg | Freq: Once | INTRAMUSCULAR | Status: DC

## 2011-02-19 MED ORDER — SODIUM CHLORIDE 0.9 % IV SOLN
3850.0000 mg | INTRAVENOUS | Status: DC
  Administered 2011-02-19: 3850 mg via INTRAVENOUS
  Filled 2011-02-19 (×3): qty 77

## 2011-02-19 NOTE — Progress Notes (Signed)
Quick Note:  Agree. They need to call when abx completed and let us know how diarrhea is. ______

## 2011-02-19 NOTE — Progress Notes (Signed)
Tolerated chemo well. 

## 2011-02-19 NOTE — Progress Notes (Signed)
Quick Note:  It will take some time for abx to work. Get PR today please. ______

## 2011-02-20 ENCOUNTER — Other Ambulatory Visit (HOSPITAL_COMMUNITY): Payer: Self-pay | Admitting: Oncology

## 2011-02-21 ENCOUNTER — Encounter (HOSPITAL_COMMUNITY): Payer: Medicaid Other

## 2011-02-21 DIAGNOSIS — C189 Malignant neoplasm of colon, unspecified: Secondary | ICD-10-CM

## 2011-02-21 MED ORDER — SODIUM CHLORIDE 0.9 % IJ SOLN
INTRAMUSCULAR | Status: AC
  Administered 2011-02-21: 10 mL via INTRAVENOUS
  Filled 2011-02-21: qty 10

## 2011-02-21 MED ORDER — PEGFILGRASTIM INJECTION 6 MG/0.6ML
6.0000 mg | Freq: Once | SUBCUTANEOUS | Status: AC
  Administered 2011-02-21: 6 mg via SUBCUTANEOUS

## 2011-02-21 MED ORDER — HEPARIN SOD (PORK) LOCK FLUSH 100 UNIT/ML IV SOLN
500.0000 [IU] | Freq: Once | INTRAVENOUS | Status: AC
  Administered 2011-02-21: 500 [IU] via INTRAVENOUS
  Filled 2011-02-21: qty 5

## 2011-02-21 MED ORDER — PEGFILGRASTIM INJECTION 6 MG/0.6ML
SUBCUTANEOUS | Status: AC
  Administered 2011-02-21: 6 mg via SUBCUTANEOUS
  Filled 2011-02-21: qty 0.6

## 2011-02-21 MED ORDER — HEPARIN SOD (PORK) LOCK FLUSH 100 UNIT/ML IV SOLN
INTRAVENOUS | Status: AC
  Administered 2011-02-21: 500 [IU] via INTRAVENOUS
  Filled 2011-02-21: qty 5

## 2011-02-21 MED ORDER — SODIUM CHLORIDE 0.9 % IJ SOLN
10.0000 mL | INTRAMUSCULAR | Status: DC | PRN
  Administered 2011-02-21: 10 mL via INTRAVENOUS
  Filled 2011-02-21: qty 10

## 2011-02-21 NOTE — Progress Notes (Signed)
Continuous IV pump D/C after flushing port with Ns 20 ml and heparin 500 units. Neulasta 6 mg given subcutaneous to left arm as requested.  Tolerated well

## 2011-02-25 ENCOUNTER — Encounter (HOSPITAL_BASED_OUTPATIENT_CLINIC_OR_DEPARTMENT_OTHER): Payer: Medicaid Other

## 2011-02-25 VITALS — BP 117/74 | HR 119 | Temp 96.8°F | Ht 73.0 in | Wt 177.2 lb

## 2011-02-25 DIAGNOSIS — C20 Malignant neoplasm of rectum: Secondary | ICD-10-CM

## 2011-02-25 DIAGNOSIS — Z5111 Encounter for antineoplastic chemotherapy: Secondary | ICD-10-CM

## 2011-02-25 DIAGNOSIS — C189 Malignant neoplasm of colon, unspecified: Secondary | ICD-10-CM

## 2011-02-25 MED ORDER — SODIUM CHLORIDE 0.9 % IV SOLN
Freq: Once | INTRAVENOUS | Status: AC
  Administered 2011-02-25: 11:00:00 via INTRAVENOUS

## 2011-02-25 MED ORDER — HEPARIN SOD (PORK) LOCK FLUSH 100 UNIT/ML IV SOLN
500.0000 [IU] | Freq: Once | INTRAVENOUS | Status: AC
  Administered 2011-02-25: 500 [IU] via INTRAVENOUS
  Filled 2011-02-25: qty 5

## 2011-02-25 MED ORDER — SODIUM CHLORIDE 0.9 % IJ SOLN
10.0000 mL | INTRAMUSCULAR | Status: DC | PRN
  Administered 2011-02-25: 10 mL
  Filled 2011-02-25: qty 10

## 2011-02-25 MED ORDER — HEPARIN SOD (PORK) LOCK FLUSH 100 UNIT/ML IV SOLN
INTRAVENOUS | Status: AC
  Filled 2011-02-25: qty 5

## 2011-02-25 NOTE — Progress Notes (Signed)
Port accessed per clinic procedure. Received fluids today. Ns 1000 ml at 250 ml/hr.  Tolerated well.

## 2011-03-04 ENCOUNTER — Encounter (HOSPITAL_BASED_OUTPATIENT_CLINIC_OR_DEPARTMENT_OTHER): Payer: Medicaid Other | Admitting: Oncology

## 2011-03-04 ENCOUNTER — Other Ambulatory Visit (HOSPITAL_COMMUNITY): Payer: Self-pay | Admitting: Oncology

## 2011-03-04 VITALS — BP 114/75 | HR 97 | Temp 97.6°F | Ht 73.0 in | Wt 182.7 lb

## 2011-03-04 DIAGNOSIS — C189 Malignant neoplasm of colon, unspecified: Secondary | ICD-10-CM

## 2011-03-04 DIAGNOSIS — C20 Malignant neoplasm of rectum: Secondary | ICD-10-CM

## 2011-03-04 DIAGNOSIS — Z86718 Personal history of other venous thrombosis and embolism: Secondary | ICD-10-CM

## 2011-03-04 LAB — COMPREHENSIVE METABOLIC PANEL
ALT: 12 U/L (ref 0–53)
AST: 13 U/L (ref 0–37)
Albumin: 3.2 g/dL — ABNORMAL LOW (ref 3.5–5.2)
Alkaline Phosphatase: 119 U/L — ABNORMAL HIGH (ref 39–117)
BUN: 6 mg/dL (ref 6–23)
CO2: 25 mEq/L (ref 19–32)
Calcium: 8.9 mg/dL (ref 8.4–10.5)
Chloride: 104 mEq/L (ref 96–112)
Creatinine, Ser: 0.78 mg/dL (ref 0.50–1.35)
GFR calc Af Amer: >90 mL/min (ref >90)
GFR calc non Af Amer: >90 mL/min (ref >90)
Glucose, Bld: 89 mg/dL (ref 70–99)
Potassium: 4 mEq/L (ref 3.5–5.1)
Sodium: 137 mEq/L (ref 135–145)
Total Bilirubin: 0.7 mg/dL (ref 0.3–1.2)
Total Protein: 6.1 g/dL (ref 6.0–8.3)

## 2011-03-04 LAB — CBC
HCT: 37.1 % — ABNORMAL LOW (ref 39.0–52.0)
Hemoglobin: 11.9 g/dL — ABNORMAL LOW (ref 13.0–17.0)
MCH: 25.1 pg — ABNORMAL LOW (ref 26.0–34.0)
MCHC: 32.1 g/dL (ref 30.0–36.0)
MCV: 78.1 fL (ref 78.0–100.0)
Platelets: 226 10*3/uL (ref 150–400)
RBC: 4.75 MIL/uL (ref 4.22–5.81)
RDW: 18.6 % — ABNORMAL HIGH (ref 11.5–15.5)
WBC: 13.6 10*3/uL — ABNORMAL HIGH (ref 4.0–10.5)

## 2011-03-04 LAB — DIFFERENTIAL
Basophils Absolute: 0.1 10*3/uL (ref 0.0–0.1)
Basophils Relative: 0 % (ref 0–1)
Eosinophils Absolute: 0.2 10*3/uL (ref 0.0–0.7)
Eosinophils Relative: 1 % (ref 0–5)
Lymphocytes Relative: 13 % (ref 12–46)
Lymphs Abs: 1.8 10*3/uL (ref 0.7–4.0)
Monocytes Absolute: 1.2 10*3/uL — ABNORMAL HIGH (ref 0.1–1.0)
Monocytes Relative: 9 % (ref 3–12)
Neutro Abs: 10.4 10*3/uL — ABNORMAL HIGH (ref 1.7–7.7)
Neutrophils Relative %: 77 % (ref 43–77)

## 2011-03-04 NOTE — Patient Instructions (Signed)
[facility]  Discharge Instructions  RECOMMENDATIONS MADE BY THE CONSULTANT AND ANY TEST RESULTS WILL BE SENT TO YOUR REFERRING DOCTOR.   EXAM FINDINGS BY MD TODAY AND SIGNS AND SYMPTOMS TO REPORT TO CLINIC OR PRIMARY MD: Exam good. We will stop the neulasta injection after chemo   INSTRUCTIONS GIVEN AND DISCUSSED: Chemo tomorrow   We will give you fluids next Monday,  SPECIAL INSTRUCTIONS/FOLLOW-UP: Return to Clinic in one month    I acknowledge that I have been informed and understand all the instructions given to me and received a copy. I do not have any more questions at this time, but understand that I may call the [facility]ty [facility] at [facility] at [phone] during business hours should I have any further questions or need assistance in obtaining follow-up care.    __________________________________________  _____________  __________ Signature of Patient or Authorized Representative            Date                   Time    __________________________________________ Nurse's Signature

## 2011-03-04 NOTE — Progress Notes (Signed)
Charles Specking., MD, MD [address] [place] [address]  1. Colon cancer  CBC, CBC, Differential, Differential, Comprehensive metabolic panel, Comprehensive metabolic panel    CURRENT THERAPY:S/P 2 cycles of FOLFOX-6 stating on 02/05/11.  He is scheduled for cycle 3 tomorrow.  S/P radiation and concurrent 5-FU continuous infusion from 09/09/10- 10/10/10.  S/P proctectomy with colorectal anastomosis and diverting loop ileostomy on 11/14/10 at [facility] by Dr. Rae Halsted.  INTERVAL HISTORY: Charles [name] 60 y.o. male returns for  regular  visit for followup of Stage III rectal cancer.  The patient denies any complaints.  He reports that he is tolerating chemotherapy well.  He denies any nausea or vomiting.  He admits to fatigue for 3-5 days 2 days following therapy administration.    The patient explains that if he takes lomotil and metamucil, his stools become thicker and he does not have to change his back as often.  He can change it every 3 hours or so.  If he does not take these medications, he must change his bag every 45 minutes.  The patient is followed by Dr. Jena Gauss for his chronic diarrhea.  He has tried a number of medications to help control this issue.     The patient denies having problem with chemotherapy in the past.  It is noted that Dr. Dalene Carrow started the patient with a 20% reduction in chemotherapy initially according to the patient.  He denies any issues with his first cycle so I wonder if she started with a 20% dose reduction due to his history of chronic diarrhea.   The patient tolerates the neulasta injection well.  The patient reports being on Coumadin.  On further questioning, we discover that he has a history of a PE prior to his surgical resection operation.  He also has an IVC filter placed as a result.   Past Medical History  Diagnosis Date  . Colon cancer     rectal ca  . HTN (hypertension)   . Pulmonary embolism   . GERD (gastroesophageal reflux disease)      has GERD; RECTAL BLEEDING; DIARRHEA; Colon cancer; Weight loss, abnormal; and Orthostatic hypotension on his problem list.      has no known allergies.  Charles [name] does not currently have medications on file.  Past Surgical History  Procedure Date  . Esophagogastroduodenoscopy 07/2010    schatki ring s/p dilation, small hh, SB bx benign  . Colonoscopy 07/2010    proximal rectal apple core mass 10-14cm from anal verge (adenocarcinoma), 2-3cm distal rectal carpet polyp s/p piecemeal snare polypectomy (adenoma)  . Eus 08/2010    Dr. Rob Bunting. uT3N0 circumferential, nearly obstruction rectosigmoid adenocarcinoma, distal edge 12cm from anal verge  . Ivc filter   . Colon surgery 11/14/2010    proctectomy with colorectal anastomosis and diverting loop ileostomy (temporary planned)  . Port a cath placement     Denies any headaches, dizziness, double vision, fevers, chills, night sweats, nausea, vomiting, diarrhea, constipation, chest pain, heart palpitations, shortness of breath, blood in stool, black tarry stool, urinary pain, urinary burning, urinary frequency, hematuria.   PHYSICAL EXAMINATION  ECOG PERFORMANCE STATUS: 1 - Symptomatic but completely ambulatory  Filed Vitals:   03/04/11 1016  BP: 114/75  Pulse: 97  Temp: 97.6 F (36.4 C)    GENERAL:alert, no distress, well nourished, well developed, comfortable, cooperative and smiling SKIN: skin color, texture, turgor are normal HEAD: Normocephalic EYES: normal EARS: External ears normal OROPHARYNX:mucous membranes are moist  NECK: supple, trachea midline LYMPH:  no palpable lymphadenopathy BREAST:not examined LUNGS: clear to auscultation and percussion HEART: regular rate & rhythm, no murmurs, no gallops, S1 normal and S2 normal ABDOMEN:abdomen soft, non-tender, normal bowel sounds and ileostomy noted and producing liquid stools BACK: Back symmetric, no curvature. EXTREMITIES:less then 2 second capillary refill,  no joint deformities, effusion, or inflammation, no edema, no skin discoloration, no clubbing, no cyanosis  NEURO: alert & oriented x 3 with fluent speech, no focal motor/sensory deficits, gait normal   LABORATORY DATA: CBC    Component Value Date/Time   WBC 11.5* 02/17/2011 1510   RBC 4.43 02/17/2011 1510   HGB 11.1* 02/17/2011 1510   HGB 11.6* 02/04/2011 1059   HCT 34.3* 02/17/2011 1510   HCT 34.7* 02/04/2011 1059   PLT 237 02/17/2011 1510   PLT 282 02/04/2011 1059   MCV 77.4* 02/17/2011 1510   MCV 76.0* 02/04/2011 1059   MCH 25.1* 02/17/2011 1510   MCHC 32.4 02/17/2011 1510   RDW 17.4* 02/17/2011 1510   LYMPHSABS 1.6 02/17/2011 1510   MONOABS 0.8 02/17/2011 1510   EOSABS 0.1 02/17/2011 1510   EOSABS 0.1 02/04/2011 1059   BASOSABS 0.0 02/17/2011 1510   BASOSABS 0.1 02/04/2011 1059      Chemistry      Component Value Date/Time   NA 136 02/17/2011 1510   NA 136 10/30/2010 0801   K 4.1 02/17/2011 1510   K 4.0 10/30/2010 0801   CL 102 02/17/2011 1510   CL 101 10/30/2010 0801   CO2 25 02/17/2011 1510   CO2 27 10/30/2010 0801   BUN 7 02/17/2011 1510   BUN 9 10/30/2010 0801   CREATININE 0.63 02/17/2011 1510   CREATININE 0.83 02/10/2011 1037      Component Value Date/Time   CALCIUM 8.5 02/17/2011 1510   CALCIUM 8.9 10/30/2010 0801   ALKPHOS 117 02/17/2011 1510   ALKPHOS 89* 10/30/2010 0801   AST 14 02/17/2011 1510   AST 17 10/30/2010 0801   ALT 28 02/17/2011 1510   BILITOT 0.4 02/17/2011 1510   BILITOT 1.00 10/30/2010 0801         RADIOGRAPHIC STUDIES:  01/10/11  *RADIOLOGY REPORT*  Clinical Data: History of rectal cancer. Diagnosed 03/12.  Chemotherapy and radiation therapy completed 6/12. Rectal surgery  with colostomy.  CT CHEST, ABDOMEN AND PELVIS WITH CONTRAST  Technique: Contiguous axial images of the chest abdomen and pelvis  were obtained after IV contrast administration.  Contrast: 100 ml Omnipaque-300  Comparison: 10/30/2010 and PET of 08/12/2010.  CT CHEST   Findings: Lung windows demonstrate mild centrilobular emphysema.  Resolved areas of ground-glass opacification within the lungs.  Soft tissue windows demonstrate a right-sided Port-A-Cath which  terminates at the low SVC. Normal heart size without pericardial or  pleural effusion. No mediastinal or hilar adenopathy.  IMPRESSION:  1. No acute process or evidence of metastatic disease in the chest.  2. Resolved areas of ground-glass opacity since the prior exam.  CT ABDOMEN AND PELVIS  Findings: Normal liver, spleen, stomach, pancreas, gallbladder,  biliary tract, right adrenal gland. Left adrenal calcification is  unchanged. Normal right kidney. A lower pole left renal lesion  measures 1.0 cm, unchanged. Fluid density on coronal image 61.  Apparent increased density on transverse image 89 is felt to be  artifactual / due to volume averaging.  IVC filter below the renal veins. No retroperitoneal or retrocrural  adenopathy.  Interval resection of the previously described rectosigmoid mass.  No evidence of  locally recurrent disease. Otherwise normal colon  and terminal ileum. Normal appendix. Right lower quadrant loop  ileostomy is also new.  Small ileocolic mesenteric nodes, likely reactive. Normal caliber  small bowel loops. No ascites or omental/peritoneal disease.  No pelvic sidewall adenopathy. Normal urinary bladder and  prostate. The perirectal nodes described previously are no longer  identified. Perirectal fascial thickening is likely surgical or  treatment related. No evidence of postoperative fluid collection.  No acute osseous abnormality.  IMPRESSION:  1. Interval resection of the rectosigmoid primary and creation of  right lower quadrant ileostomy. No evidence of recurrent or  metastatic disease.  2. Question hepatic steatosis, mild.  3. Left renal lesion is similar in size. Favored to represent a  small cyst.  4. Left adrenal calcification, likely related to prior  hemorrhage  or infection.  Original Report Authenticated By: Consuello Bossier, M.D.     ASSESSMENT:  1. Stage III Rectal cancer, S/P radiation and chemotherapy, then surgical resection, now on FOLFOX chemotherapy. 2. S/P IVC filter placement 3. On Coumadin anticoagulation for PE in the past.   PLAN:  1. Pre-chemo lab work today and every 2 weeks: CBC diff, CMET 2. Return to the clinic in 1 month for follow-up 3. D/C Neulasta 4. Change daily of IV fluids to Monday 5. Will discuss the patient's case with Dr. Jena Gauss regarding treatment/control of diarrhea. 6. I personally reviewed and went over laboratory results with the patient. 7. I personally reviewed and went over radiographic studies with the patient.    All questions were answered. The patient knows to call the clinic with any problems, questions or concerns. We can certainly see the patient much sooner if necessary.  The patient and plan discussed with Glenford Peers, MD and he is in agreement with the aforementioned.  Patient seen by Dr. Mariel Sleet  More than 50% of the time spent with the patient was utilized for counseling and coordination of care.    Kyoko Elsea

## 2011-03-05 ENCOUNTER — Encounter (HOSPITAL_BASED_OUTPATIENT_CLINIC_OR_DEPARTMENT_OTHER): Payer: Medicaid Other

## 2011-03-05 VITALS — BP 120/88 | HR 106 | Temp 96.9°F | Wt 184.0 lb

## 2011-03-05 DIAGNOSIS — Z5111 Encounter for antineoplastic chemotherapy: Secondary | ICD-10-CM

## 2011-03-05 DIAGNOSIS — C189 Malignant neoplasm of colon, unspecified: Secondary | ICD-10-CM

## 2011-03-05 DIAGNOSIS — C19 Malignant neoplasm of rectosigmoid junction: Secondary | ICD-10-CM

## 2011-03-05 MED ORDER — DEXTROSE 5 % IV SOLN: Freq: Once | INTRAVENOUS | Status: DC

## 2011-03-05 MED ORDER — DEXTROSE 5 % IV BOLUS
50.0000 mL | INTRAVENOUS | Status: AC
  Administered 2011-03-05: 50 mL via INTRAVENOUS
  Filled 2011-03-05 (×2): qty 50

## 2011-03-05 MED ORDER — SODIUM CHLORIDE 0.9 % IV SOLN
3850.0000 mg | INTRAVENOUS | Status: DC
  Administered 2011-03-05: 3850 mg via INTRAVENOUS
  Filled 2011-03-05 (×2): qty 77

## 2011-03-05 MED ORDER — DEXAMETHASONE SODIUM PHOSPHATE 10 MG/ML IJ SOLN: 10.0000 mg | Freq: Once | INTRAMUSCULAR | Status: DC

## 2011-03-05 MED ORDER — ONDANSETRON 8 MG/50ML IVPB (CHCC): 8.0000 mg | Freq: Once | INTRAVENOUS | Status: DC

## 2011-03-05 MED ORDER — SODIUM CHLORIDE 0.9 % IV SOLN
Freq: Once | INTRAVENOUS | Status: AC
  Administered 2011-03-05: 8 mg via INTRAVENOUS
  Filled 2011-03-05: qty 4

## 2011-03-05 MED ORDER — DEXTROSE 5 % IV SOLN
140.0000 mg | Freq: Once | INTRAVENOUS | Status: AC
  Administered 2011-03-05: 140 mg via INTRAVENOUS
  Filled 2011-03-05: qty 28

## 2011-03-05 MED ORDER — FLUOROURACIL CHEMO INJECTION 2.5 GM/50ML
700.0000 mg | Freq: Once | INTRAVENOUS | Status: AC
  Administered 2011-03-05: 700 mg via INTRAVENOUS
  Filled 2011-03-05 (×2): qty 14

## 2011-03-05 MED ORDER — LEUCOVORIN CALCIUM INJECTION 350 MG: 660.0000 mg | Freq: Once | INTRAVENOUS | Status: DC

## 2011-03-05 MED ORDER — LEUCOVORIN CALCIUM INJECTION 100 MG
42.0000 mg | Freq: Once | INTRAMUSCULAR | Status: AC
  Administered 2011-03-05: 42 mg via INTRAVENOUS
  Filled 2011-03-05: qty 2.1

## 2011-03-05 MED ORDER — SODIUM CHLORIDE 0.9 % IV SOLN
INTRAVENOUS | Status: DC
  Administered 2011-03-05: 10:00:00 via INTRAVENOUS

## 2011-03-07 ENCOUNTER — Ambulatory Visit (HOSPITAL_COMMUNITY): Payer: Medicaid Other

## 2011-03-07 ENCOUNTER — Encounter (HOSPITAL_COMMUNITY): Payer: Medicaid Other | Attending: Oncology

## 2011-03-07 DIAGNOSIS — Z452 Encounter for adjustment and management of vascular access device: Secondary | ICD-10-CM

## 2011-03-07 DIAGNOSIS — C20 Malignant neoplasm of rectum: Secondary | ICD-10-CM | POA: Insufficient documentation

## 2011-03-07 DIAGNOSIS — C189 Malignant neoplasm of colon, unspecified: Secondary | ICD-10-CM

## 2011-03-07 MED ORDER — SODIUM CHLORIDE 0.9 % IJ SOLN
10.0000 mL | INTRAMUSCULAR | Status: DC | PRN
  Administered 2011-03-07: 10 mL via INTRAVENOUS
  Filled 2011-03-07: qty 10

## 2011-03-07 MED ORDER — HEPARIN SOD (PORK) LOCK FLUSH 100 UNIT/ML IV SOLN
INTRAVENOUS | Status: AC
  Administered 2011-03-07: 500 [IU] via INTRAVENOUS
  Filled 2011-03-07: qty 5

## 2011-03-07 MED ORDER — HEPARIN SOD (PORK) LOCK FLUSH 100 UNIT/ML IV SOLN
500.0000 [IU] | Freq: Once | INTRAVENOUS | Status: AC
  Administered 2011-03-07: 500 [IU] via INTRAVENOUS
  Filled 2011-03-07: qty 5

## 2011-03-10 ENCOUNTER — Telehealth (HOSPITAL_COMMUNITY): Payer: Self-pay

## 2011-03-10 ENCOUNTER — Encounter (HOSPITAL_BASED_OUTPATIENT_CLINIC_OR_DEPARTMENT_OTHER): Payer: Medicaid Other

## 2011-03-10 VITALS — BP 107/72 | HR 108 | Temp 97.3°F

## 2011-03-10 DIAGNOSIS — E860 Dehydration: Secondary | ICD-10-CM | POA: Insufficient documentation

## 2011-03-10 MED ORDER — HEPARIN SOD (PORK) LOCK FLUSH 100 UNIT/ML IV SOLN
INTRAVENOUS | Status: AC
  Administered 2011-03-10: 500 [IU] via INTRAVENOUS
  Filled 2011-03-10: qty 5

## 2011-03-10 MED ORDER — HEPARIN SOD (PORK) LOCK FLUSH 100 UNIT/ML IV SOLN
500.0000 [IU] | Freq: Once | INTRAVENOUS | Status: DC
  Filled 2011-03-10: qty 5

## 2011-03-10 MED ORDER — HEPARIN SOD (PORK) LOCK FLUSH 100 UNIT/ML IV SOLN
500.0000 [IU] | Freq: Once | INTRAVENOUS | Status: AC
  Administered 2011-03-10: 500 [IU] via INTRAVENOUS
  Filled 2011-03-10: qty 5

## 2011-03-10 MED ORDER — SODIUM CHLORIDE 0.9 % IV SOLN
INTRAVENOUS | Status: DC
  Administered 2011-03-10: 1000 mL via INTRAVENOUS

## 2011-03-10 NOTE — Progress Notes (Signed)
Tolerated hydration well.

## 2011-03-11 ENCOUNTER — Ambulatory Visit (HOSPITAL_COMMUNITY): Payer: Medicaid Other

## 2011-03-19 ENCOUNTER — Encounter (HOSPITAL_BASED_OUTPATIENT_CLINIC_OR_DEPARTMENT_OTHER): Payer: Medicaid Other

## 2011-03-19 VITALS — BP 125/80 | HR 81 | Temp 97.8°F | Ht 73.0 in | Wt 188.6 lb

## 2011-03-19 DIAGNOSIS — C20 Malignant neoplasm of rectum: Secondary | ICD-10-CM

## 2011-03-19 DIAGNOSIS — Z5111 Encounter for antineoplastic chemotherapy: Secondary | ICD-10-CM

## 2011-03-19 DIAGNOSIS — C189 Malignant neoplasm of colon, unspecified: Secondary | ICD-10-CM

## 2011-03-19 LAB — COMPREHENSIVE METABOLIC PANEL
ALT: 15 U/L (ref 0–53)
AST: 15 U/L (ref 0–37)
Albumin: 3 g/dL — ABNORMAL LOW (ref 3.5–5.2)
Alkaline Phosphatase: 75 U/L (ref 39–117)
BUN: 5 mg/dL — ABNORMAL LOW (ref 6–23)
CO2: 24 mEq/L (ref 19–32)
Calcium: 8.9 mg/dL (ref 8.4–10.5)
Chloride: 106 mEq/L (ref 96–112)
Creatinine, Ser: 0.69 mg/dL (ref 0.50–1.35)
GFR calc Af Amer: >90 mL/min (ref >90)
GFR calc non Af Amer: >90 mL/min (ref >90)
Glucose, Bld: 89 mg/dL (ref 70–99)
Potassium: 4.1 mEq/L (ref 3.5–5.1)
Sodium: 136 mEq/L (ref 135–145)
Total Bilirubin: 0.9 mg/dL (ref 0.3–1.2)
Total Protein: 5.9 g/dL — ABNORMAL LOW (ref 6.0–8.3)

## 2011-03-19 LAB — DIFFERENTIAL
Basophils Absolute: 0 10*3/uL (ref 0.0–0.1)
Basophils Relative: 0 % (ref 0–1)
Eosinophils Absolute: 0.1 10*3/uL (ref 0.0–0.7)
Eosinophils Relative: 3 % (ref 0–5)
Lymphocytes Relative: 21 % (ref 12–46)
Lymphs Abs: 1 10*3/uL (ref 0.7–4.0)
Monocytes Absolute: 0.6 10*3/uL (ref 0.1–1.0)
Monocytes Relative: 13 % — ABNORMAL HIGH (ref 3–12)
Neutro Abs: 2.9 10*3/uL (ref 1.7–7.7)
Neutrophils Relative %: 63 % (ref 43–77)

## 2011-03-19 LAB — CBC
HCT: 32.4 % — ABNORMAL LOW (ref 39.0–52.0)
Hemoglobin: 10.4 g/dL — ABNORMAL LOW (ref 13.0–17.0)
MCH: 25.4 pg — ABNORMAL LOW (ref 26.0–34.0)
MCHC: 32.1 g/dL (ref 30.0–36.0)
MCV: 79.2 fL (ref 78.0–100.0)
Platelets: 203 10*3/uL (ref 150–400)
RBC: 4.09 MIL/uL — ABNORMAL LOW (ref 4.22–5.81)
RDW: 18.3 % — ABNORMAL HIGH (ref 11.5–15.5)
WBC: 4.6 10*3/uL (ref 4.0–10.5)

## 2011-03-19 MED ORDER — DEXTROSE 5 % IV SOLN
50.0000 mL | INTRAVENOUS | Status: AC
  Filled 2011-03-19 (×2): qty 50

## 2011-03-19 MED ORDER — SODIUM CHLORIDE 0.9 % IJ SOLN
10.0000 mL | INTRAMUSCULAR | Status: DC | PRN
  Filled 2011-03-19: qty 10

## 2011-03-19 MED ORDER — HEPARIN SOD (PORK) LOCK FLUSH 100 UNIT/ML IV SOLN
500.0000 [IU] | Freq: Once | INTRAVENOUS | Status: DC | PRN
  Filled 2011-03-19: qty 5

## 2011-03-19 MED ORDER — LEUCOVORIN CALCIUM INJECTION 350 MG: 660.0000 mg | Freq: Once | INTRAVENOUS | Status: DC

## 2011-03-19 MED ORDER — ONDANSETRON HCL 4 MG/2ML IJ SOLN
Freq: Once | INTRAMUSCULAR | Status: AC
  Administered 2011-03-19: 8 mg via INTRAVENOUS
  Filled 2011-03-19: qty 4

## 2011-03-19 MED ORDER — LEUCOVORIN CALCIUM INJECTION 100 MG
20.0000 mg/m2 | Freq: Once | INTRAMUSCULAR | Status: AC
  Administered 2011-03-19: 42 mg via INTRAVENOUS
  Filled 2011-03-19: qty 2.1

## 2011-03-19 MED ORDER — OXALIPLATIN CHEMO INJECTION 100 MG/20ML
140.0000 mg | Freq: Once | INTRAVENOUS | Status: AC
  Administered 2011-03-19: 140 mg via INTRAVENOUS
  Filled 2011-03-19: qty 28

## 2011-03-19 MED ORDER — ONDANSETRON 8 MG/50ML IVPB (CHCC): 8.0000 mg | Freq: Once | INTRAVENOUS | Status: DC

## 2011-03-19 MED ORDER — SODIUM CHLORIDE 0.9 % IV SOLN
3850.0000 mg | INTRAVENOUS | Status: DC
  Administered 2011-03-19: 3850 mg via INTRAVENOUS
  Filled 2011-03-19 (×2): qty 77

## 2011-03-19 MED ORDER — FLUOROURACIL CHEMO INJECTION 2.5 GM/50ML
700.0000 mg | Freq: Once | INTRAVENOUS | Status: AC
  Administered 2011-03-19: 700 mg via INTRAVENOUS
  Filled 2011-03-19: qty 14

## 2011-03-19 MED ORDER — DEXAMETHASONE SODIUM PHOSPHATE 10 MG/ML IJ SOLN: 10.0000 mg | Freq: Once | INTRAMUSCULAR | Status: DC

## 2011-03-21 ENCOUNTER — Telehealth (HOSPITAL_COMMUNITY): Payer: Self-pay | Admitting: *Deleted

## 2011-03-21 ENCOUNTER — Encounter (HOSPITAL_BASED_OUTPATIENT_CLINIC_OR_DEPARTMENT_OTHER): Payer: Medicaid Other

## 2011-03-21 ENCOUNTER — Encounter (HOSPITAL_COMMUNITY): Payer: Medicaid Other

## 2011-03-21 DIAGNOSIS — C189 Malignant neoplasm of colon, unspecified: Secondary | ICD-10-CM

## 2011-03-21 DIAGNOSIS — Z452 Encounter for adjustment and management of vascular access device: Secondary | ICD-10-CM

## 2011-03-21 DIAGNOSIS — C20 Malignant neoplasm of rectum: Secondary | ICD-10-CM

## 2011-03-21 MED ORDER — HEPARIN SOD (PORK) LOCK FLUSH 100 UNIT/ML IV SOLN
INTRAVENOUS | Status: AC
  Filled 2011-03-21: qty 5

## 2011-03-21 MED ORDER — SODIUM CHLORIDE 0.9 % IJ SOLN
10.0000 mL | INTRAMUSCULAR | Status: DC | PRN
  Filled 2011-03-21: qty 10

## 2011-03-21 MED ORDER — HEPARIN SOD (PORK) LOCK FLUSH 100 UNIT/ML IV SOLN
500.0000 [IU] | Freq: Once | INTRAVENOUS | Status: DC | PRN
  Filled 2011-03-21: qty 5

## 2011-03-21 MED ORDER — SODIUM CHLORIDE 0.9 % IJ SOLN
INTRAMUSCULAR | Status: AC
  Filled 2011-03-21: qty 10

## 2011-03-21 NOTE — Telephone Encounter (Signed)
Patient does not want to take Neulasta injection. He took it one time and said he will never take it again. This is coming from Charles [name] - I have no further knowledge as to what happened when he took it. Will you remove it from his treatment plan please?

## 2011-03-24 ENCOUNTER — Other Ambulatory Visit (HOSPITAL_COMMUNITY): Payer: Self-pay | Admitting: Oncology

## 2011-03-25 ENCOUNTER — Telehealth: Payer: Self-pay | Admitting: Gastroenterology

## 2011-03-25 ENCOUNTER — Telehealth (HOSPITAL_COMMUNITY): Payer: Self-pay | Admitting: *Deleted

## 2011-03-25 NOTE — Telephone Encounter (Signed)
Spoke with pts wife- she said he is about the same as he was, having watery diarrhea. He ran out of the lomotil that he was taking but he is still taking the powder medicine. He empties his bag 8-10 x a day. The cipro and flagyl didn't seem to make it worse or better, She said it was about the same.

## 2011-03-25 NOTE — Telephone Encounter (Signed)
For some reason, he got scheduled for neulasta without an order. So he doesn't actually have the neulasta in the tx plan or supportive plan per Whitney. Charles [name] was the original person dealing with him. She verified that this was not in his tx/supportive plan.

## 2011-03-25 NOTE — Telephone Encounter (Signed)
Please call the patient and get a progress report regarding his diarrhea. I received a staff message from Dr. Wray Kearns PA wanting [place] to followup with him for this. Please find out how frequently he is having to empty his bag. Please find out what medications he is taking for his diarrhea. Please find out if course of Cipro and Flagyl last month seem to help his diarrhea appear

## 2011-03-25 NOTE — Telephone Encounter (Signed)
The powder medication is [place].

## 2011-03-26 MED ORDER — DIPHENOXYLATE-ATROPINE 2.5-0.025 MG PO TABS: 1.0000 | ORAL_TABLET | Freq: Three times a day (TID) | ORAL | Status: DC | PRN

## 2011-03-26 MED ORDER — PANCRELIPASE (LIP-PROT-AMYL) 24000-76000 UNITS PO CPEP: ORAL_CAPSULE | ORAL | Status: DC

## 2011-03-26 NOTE — Telephone Encounter (Signed)
Please have the patient continue Questran. You may call in more Lomotil, #90, take 1 by mouth 3 times a day when necessary, no refills. I have sent Creon 24000 2 [facility] in [place], he will take 2 with meals and one with snacks. He should call in 2 weeks with a progress report.

## 2011-03-26 NOTE — Telephone Encounter (Signed)
pts wife aware, rx for lomotil called to [facility].

## 2011-04-01 ENCOUNTER — Encounter (HOSPITAL_BASED_OUTPATIENT_CLINIC_OR_DEPARTMENT_OTHER): Payer: Medicaid Other

## 2011-04-01 ENCOUNTER — Encounter (HOSPITAL_BASED_OUTPATIENT_CLINIC_OR_DEPARTMENT_OTHER): Payer: Medicaid Other | Admitting: Oncology

## 2011-04-01 ENCOUNTER — Encounter (HOSPITAL_COMMUNITY): Payer: Self-pay | Admitting: Oncology

## 2011-04-01 VITALS — BP 126/80 | HR 94 | Temp 97.8°F | Ht 73.0 in | Wt 193.8 lb

## 2011-04-01 DIAGNOSIS — F32A Depression, unspecified: Secondary | ICD-10-CM

## 2011-04-01 DIAGNOSIS — C189 Malignant neoplasm of colon, unspecified: Secondary | ICD-10-CM

## 2011-04-01 DIAGNOSIS — C20 Malignant neoplasm of rectum: Secondary | ICD-10-CM

## 2011-04-01 DIAGNOSIS — F329 Major depressive disorder, single episode, unspecified: Secondary | ICD-10-CM

## 2011-04-01 DIAGNOSIS — R197 Diarrhea, unspecified: Secondary | ICD-10-CM

## 2011-04-01 DIAGNOSIS — Z5111 Encounter for antineoplastic chemotherapy: Secondary | ICD-10-CM

## 2011-04-01 HISTORY — DX: Depression, unspecified: F32.A

## 2011-04-01 LAB — CBC
HCT: 33.1 % — ABNORMAL LOW (ref 39.0–52.0)
Hemoglobin: 10.6 g/dL — ABNORMAL LOW (ref 13.0–17.0)
MCH: 25.7 pg — ABNORMAL LOW (ref 26.0–34.0)
MCHC: 32 g/dL (ref 30.0–36.0)
MCV: 80.3 fL (ref 78.0–100.0)
Platelets: 204 10*3/uL (ref 150–400)
RBC: 4.12 MIL/uL — ABNORMAL LOW (ref 4.22–5.81)
RDW: 18.6 % — ABNORMAL HIGH (ref 11.5–15.5)
WBC: 5.1 10*3/uL (ref 4.0–10.5)

## 2011-04-01 LAB — BASIC METABOLIC PANEL
BUN: 6 mg/dL (ref 6–23)
CO2: 26 mEq/L (ref 19–32)
Calcium: 8.7 mg/dL (ref 8.4–10.5)
Chloride: 103 mEq/L (ref 96–112)
Creatinine, Ser: 0.71 mg/dL (ref 0.50–1.35)
GFR calc Af Amer: >90 mL/min (ref >90)
GFR calc non Af Amer: >90 mL/min (ref >90)
Glucose, Bld: 153 mg/dL — ABNORMAL HIGH (ref 70–99)
Potassium: 3.7 mEq/L (ref 3.5–5.1)
Sodium: 137 mEq/L (ref 135–145)

## 2011-04-01 LAB — DIFFERENTIAL
Basophils Absolute: 0 10*3/uL (ref 0.0–0.1)
Basophils Relative: 1 % (ref 0–1)
Eosinophils Absolute: 0.1 10*3/uL (ref 0.0–0.7)
Eosinophils Relative: 2 % (ref 0–5)
Lymphocytes Relative: 18 % (ref 12–46)
Lymphs Abs: 0.9 10*3/uL (ref 0.7–4.0)
Monocytes Absolute: 0.5 10*3/uL (ref 0.1–1.0)
Monocytes Relative: 9 % (ref 3–12)
Neutro Abs: 3.6 10*3/uL (ref 1.7–7.7)
Neutrophils Relative %: 70 % (ref 43–77)

## 2011-04-01 MED ORDER — DEXAMETHASONE SODIUM PHOSPHATE 10 MG/ML IJ SOLN: 10.0000 mg | Freq: Once | INTRAMUSCULAR | Status: DC

## 2011-04-01 MED ORDER — SODIUM CHLORIDE 0.9 % IV SOLN
3850.0000 mg | INTRAVENOUS | Status: DC
  Administered 2011-04-01: 3850 mg via INTRAVENOUS
  Filled 2011-04-01 (×3): qty 77

## 2011-04-01 MED ORDER — SODIUM CHLORIDE 0.9 % IV SOLN
Freq: Once | INTRAVENOUS | Status: AC
  Administered 2011-04-01: 8 mg via INTRAVENOUS
  Filled 2011-04-01: qty 4

## 2011-04-01 MED ORDER — DEXTROSE 5 % IV BOLUS
50.0000 mL | INTRAVENOUS | Status: AC
  Administered 2011-04-01: 50 mL via INTRAVENOUS
  Filled 2011-04-01 (×2): qty 50

## 2011-04-01 MED ORDER — CITALOPRAM HYDROBROMIDE 20 MG PO TABS: 20.0000 mg | ORAL_TABLET | Freq: Every day | ORAL | Status: DC

## 2011-04-01 MED ORDER — OXALIPLATIN CHEMO INJECTION 100 MG/20ML
140.0000 mg | Freq: Once | INTRAVENOUS | Status: AC
  Administered 2011-04-01: 140 mg via INTRAVENOUS
  Filled 2011-04-01: qty 28

## 2011-04-01 MED ORDER — LEUCOVORIN CALCIUM INJECTION 100 MG
20.0000 mg/m2 | Freq: Once | INTRAMUSCULAR | Status: AC
  Administered 2011-04-01: 42 mg via INTRAVENOUS
  Filled 2011-04-01 (×2): qty 2.1

## 2011-04-01 MED ORDER — FLUOROURACIL CHEMO INJECTION 2.5 GM/50ML
700.0000 mg | Freq: Once | INTRAVENOUS | Status: AC
  Administered 2011-04-01: 700 mg via INTRAVENOUS
  Filled 2011-04-01 (×2): qty 14

## 2011-04-01 MED ORDER — SODIUM CHLORIDE 0.9 % IV SOLN: 8.0000 mg | Freq: Once | INTRAVENOUS | Status: DC

## 2011-04-01 MED ORDER — DEXTROSE 5 % IV SOLN: Freq: Once | INTRAVENOUS | Status: DC

## 2011-04-01 MED ORDER — LEUCOVORIN CALCIUM INJECTION 350 MG: 660.0000 mg | Freq: Once | INTRAVENOUS | Status: DC

## 2011-04-01 NOTE — Progress Notes (Signed)
Labs drawn today for cbc/diff,bmp 

## 2011-04-01 NOTE — Progress Notes (Signed)
Charles [name]., MD, MD [address] [place] [address]  1. Depression  citalopram (CELEXA) 20 MG tablet  2. Colon cancer    3. Diarrhea      CURRENT THERAPY:S/P 5 cycles of FOLFOX-6 stating on 02/05/11. He is scheduled for cycle 3 tomorrow. S/P radiation and concurrent 5-FU continuous infusion from 09/09/10- 10/10/10. S/P proctectomy with colorectal anastomosis and diverting loop ileostomy on 11/14/10 at [facility] by Dr. Rae Halsted.    INTERVAL HISTORY: Charles [name] 60 y.o. male returns for  regular  visit for followup of Stage III rectal cancer.   The patient's only complaint is his continuation of chronic diarrhea and his ileostomy bag.  His gastroenterologist and mid level have been working to control this issue. They have recently placed him on Creon 24,000 unit which he is supposed to take two with meals and one with snacks. He reports that this is not working for him. I will defer further treatment of this to his gastroenterologist.  His wife reports that the patient was placed on citalopram for depression by Dr. Arlan Organ at [facility] at time of diagnosis. The patient and his wife both agree that this medication is helping his depression. He requests a refill of this antidepressant. I will gladly E. scribed this medication to [facility] in [place], [place].  Otherwise the patient denies any complaints. He does explain that he is consuming a large amount of liquids in order to keep up with his chronic diarrhea. He explains that he has been decreasing his fluid intake at nighttime in order to reduce the number of times he must be awakened during the night to empty his bag. He reports that in the past when he continue drinking a significant amount of fluid at nighttime he would need to empty his ostomy bag every hour or so. This caused him to not sleep well. Therefore, as mentioned above, he is decreasing his fluid intake at night and this is helping him sleep  longer between bag evacuations.   Is quite evident that the patient's major problem is his chronic diarrhea. He is tolerating chemotherapy quite well. He does report some cold intolerance for just a few days following chemotherapy administration. This is secondary to the oxaliplatin chemotherapy. He reports that he wears mittens on his hands and dresses is warm to prevent cold intolerance. He does admit to some peripheral neuropathy that resolves prior to administration of his next cycle of chemotherapy.  The patient's appetite is quite strong. According to the  he is eating quite a bit at home. He has gained weight since his surgery and he is weighing approximately 190 pounds. He is 6 foot 1 inch and therefore his current weight is probably a good healthy weight for him.  Past Medical History  Diagnosis Date  . Colon cancer     rectal ca  . HTN (hypertension)   . Pulmonary embolism   . GERD (gastroesophageal reflux disease)   . Depression 04/01/2011    has GERD; RECTAL BLEEDING; DIARRHEA; Colon cancer; Weight loss, abnormal; Orthostatic hypotension; Dehydration; and Depression on his problem list.      has no known allergies.  Mr. Gaffin had no medications administered during this visit.  Past Surgical History  Procedure Date  . Esophagogastroduodenoscopy 07/2010    schatki ring s/p dilation, small hh, SB bx benign  . Colonoscopy 07/2010    proximal rectal apple core mass 10-14cm from anal verge (adenocarcinoma), 2-3cm distal rectal carpet  polyp s/p piecemeal snare polypectomy (adenoma)  . Eus 08/2010    Dr. Rob Bunting. uT3N0 circumferential, nearly obstruction rectosigmoid adenocarcinoma, distal edge 12cm from anal verge  . Ivc filter   . Colon surgery 11/14/2010    proctectomy with colorectal anastomosis and diverting loop ileostomy (temporary planned)  . Port a cath placement     Denies any headaches, dizziness, double vision, fevers, chills, night sweats, nausea, vomiting,  constipation, chest pain, heart palpitations, shortness of breath, blood in stool, black tarry stool, urinary pain, urinary burning, urinary frequency, hematuria.   PHYSICAL EXAMINATION  ECOG PERFORMANCE STATUS: 1 - Symptomatic but completely ambulatory  There were no vitals filed for this visit.  GENERAL:alert, no distress, well nourished, well developed, comfortable, cooperative and smiling SKIN: skin color, texture, turgor are normal HEAD: Normocephalic EYES: normal EARS: External ears normal OROPHARYNX:mucous membranes are moist  NECK: supple, no adenopathy, thyroid normal size, non-tender, without nodularity, no stridor, non-tender, trachea midline LYMPH:  no palpable lymphadenopathy BREAST:not examined LUNGS: clear to auscultation  HEART: regular rate & rhythm, no murmurs, no gallops, S1 normal and S2 normal ABDOMEN:abdomen soft, non-tender, normal bowel sounds and ileostomy bag appreciated with watery brown liquid. BACK: Back symmetric, no curvature. EXTREMITIES:less then 2 second capillary refill, no joint deformities, effusion, or inflammation, no edema, no skin discoloration, no clubbing, no cyanosis  NEURO: alert & oriented x 3 with fluent speech, no focal motor/sensory deficits, gait normal   LABORATORY DATA: CBC    Component Value Date/Time   WBC 5.1 04/01/2011 0832   WBC 6.5 02/04/2011 1059   RBC 4.12* 04/01/2011 0832   RBC 4.57 02/04/2011 1059   HGB 10.6* 04/01/2011 0832   HGB 11.6* 02/04/2011 1059   HCT 33.1* 04/01/2011 0832   HCT 34.7* 02/04/2011 1059   PLT 204 04/01/2011 0832   PLT 282 02/04/2011 1059   MCV 80.3 04/01/2011 0832   MCV 76.0* 02/04/2011 1059   MCH 25.7* 04/01/2011 0832   MCH 25.4* 02/04/2011 1059   MCHC 32.0 04/01/2011 0832   MCHC 33.5 02/04/2011 1059   RDW 18.6* 04/01/2011 0832   RDW 18.5* 02/04/2011 1059   LYMPHSABS 0.9 04/01/2011 0832   LYMPHSABS 1.1 02/04/2011 1059   MONOABS 0.5 04/01/2011 0832   MONOABS 0.5 02/04/2011 1059   EOSABS 0.1  04/01/2011 0832   EOSABS 0.1 02/04/2011 1059   BASOSABS 0.0 04/01/2011 0832   BASOSABS 0.1 02/04/2011 1059      Chemistry      Component Value Date/Time   NA 137 04/01/2011 0832   NA 136 10/30/2010 0801   K 3.7 04/01/2011 0832   K 4.0 10/30/2010 0801   CL 103 04/01/2011 0832   CL 101 10/30/2010 0801   CO2 26 04/01/2011 0832   CO2 27 10/30/2010 0801   BUN 6 04/01/2011 0832   BUN 9 10/30/2010 0801   CREATININE 0.71 04/01/2011 0832   CREATININE 0.83 02/10/2011 1037      Component Value Date/Time   CALCIUM 8.7 04/01/2011 0832   CALCIUM 8.9 10/30/2010 0801   ALKPHOS 75 03/19/2011 1059   ALKPHOS 89* 10/30/2010 0801   AST 15 03/19/2011 1059   AST 17 10/30/2010 0801   ALT 15 03/19/2011 1059   BILITOT 0.9 03/19/2011 1059   BILITOT 1.00 10/30/2010 0801     Lab Results  Component Value Date   CEA 0.7 12/24/2010   CEA 0.7 12/24/2010      ASSESSMENT:  1. Stage III Rectal cancer, S/P radiation and chemotherapy, then  surgical resection, now on FOLFOX chemotherapy.  2. S/P IVC filter placement  3. On Coumadin anticoagulation for PE in the past. 4. Chronic diarrhea, followed by gastroenterology. 5. Depression, on citalopram with good results   PLAN:  1. Pre-chemotherapy laboratory work ordered 2. Patient will follow up with gastroenterology regarding his diarrhea. 3. I e. scribed citalopram 20 mg to [facility] in [place], [place] #30 with 3 refills. He is to take one tablet by mouth daily. 4. I personally reviewed and went over laboratory results with the patient. 5.  Patient will return to clinic in 4 weeks time for followup. We will coordinate this with a chemotherapy infusion appointment   All questions were answered. The patient knows to call the clinic with any problems, questions or concerns. We can certainly see the patient much sooner if necessary.  The patient and plan discussed with Glenford Peers, MD and he is in agreement with the aforementioned.   Alvis Edgell

## 2011-04-01 NOTE — Progress Notes (Signed)
Tolerated chemo well. 

## 2011-04-02 ENCOUNTER — Other Ambulatory Visit (HOSPITAL_COMMUNITY): Payer: Medicaid Other

## 2011-04-02 ENCOUNTER — Ambulatory Visit (HOSPITAL_COMMUNITY): Payer: Medicaid Other | Admitting: Oncology

## 2011-04-02 ENCOUNTER — Ambulatory Visit (INDEPENDENT_AMBULATORY_CARE_PROVIDER_SITE_OTHER): Payer: Medicaid Other | Admitting: Urgent Care

## 2011-04-02 ENCOUNTER — Encounter: Payer: Self-pay | Admitting: Urgent Care

## 2011-04-02 DIAGNOSIS — R197 Diarrhea, unspecified: Secondary | ICD-10-CM

## 2011-04-02 DIAGNOSIS — C189 Malignant neoplasm of colon, unspecified: Secondary | ICD-10-CM

## 2011-04-02 MED ORDER — OCTREOTIDE ACETATE 50 MCG/ML IJ SOLN: 50.0000 ug | Freq: Two times a day (BID) | INTRAMUSCULAR | Status: DC

## 2011-04-02 NOTE — Progress Notes (Signed)
Primary Care Physician:  [name]., MD, MD Primary Gastroenterologist:  Dr. Jena Gauss  Chief Complaint  Patient presents with  . Follow-up    Diarrhea    HPI:  Charles [name] is a 60 y.o. male here for followup of chronic diarrhea.  He had proctectomy with colorectal anastomosis and diverting loop ileostomy on 11/14/10 for rectal adenocarcinoma.  He is followed by Dr. Mariel Sleet. Denies rectal bleeding or melena.  Energy better.  No abd pain.  Changes bag 4 times during the night.  It has ruptured at night on several occasions.  He is emptying his bag 8-10 times during the day.  He does eats 3 meals & snacks q 1-2 hrs throughout the day. His wife tells me he is constantly eating.  He has tried various medications to help with the diarrhea including Lomotil TID which she tells me was no help.  Questran made diarrhea worse.  He has been on Creon for almost one week now taking 2 w/ meals & 1 w/ snacks-so far no help.  Metamucil & imodium TID-seems to help best. He has been gaining a few pounds. Good appetite.  Denies N/V.  Cipro & flagyl for a 7 day trial-no help with his diarrhea.  He is very frustrated at this point and wants some relief.  Recent Results (from the past 336 hour(s))  BASIC METABOLIC PANEL   Collection Time   04/01/11  8:32 AM      Component Value Range   Sodium 137  135 - 145 (mEq/L)   Potassium 3.7  3.5 - 5.1 (mEq/L)   Chloride 103  96 - 112 (mEq/L)   CO2 26  19 - 32 (mEq/L)   Glucose, Bld 153 (*) 70 - 99 (mg/dL)   BUN 6  6 - 23 (mg/dL)   Creatinine, Ser 1.61  0.50 - 1.35 (mg/dL)   Calcium 8.7  8.4 - 09.6 (mg/dL)   GFR calc non Af Amer >90  >90 (mL/min)   GFR calc Af Amer >90  >90 (mL/min)  CBC   Collection Time   04/01/11  8:32 AM      Component Value Range   WBC 5.1  4.0 - 10.5 (K/uL)   RBC 4.12 (*) 4.22 - 5.81 (MIL/uL)   Hemoglobin 10.6 (*) 13.0 - 17.0 (g/dL)   HCT 04.5 (*) 40.9 - 52.0 (%)   MCV 80.3  78.0 - 100.0 (fL)   MCH 25.7 (*) 26.0 - 34.0 (pg)   MCHC  32.0  30.0 - 36.0 (g/dL)   RDW 81.1 (*) 91.4 - 15.5 (%)   Platelets 204  150 - 400 (K/uL)  DIFFERENTIAL   Collection Time   04/01/11  8:32 AM      Component Value Range   Neutrophils Relative 70  43 - 77 (%)   Neutro Abs 3.6  1.7 - 7.7 (K/uL)   Lymphocytes Relative 18  12 - 46 (%)   Lymphs Abs 0.9  0.7 - 4.0 (K/uL)   Monocytes Relative 9  3 - 12 (%)   Monocytes Absolute 0.5  0.1 - 1.0 (K/uL)   Eosinophils Relative 2  0 - 5 (%)   Eosinophils Absolute 0.1  0.0 - 0.7 (K/uL)   Basophils Relative 1  0 - 1 (%)   Basophils Absolute 0.0  0.0 - 0.1 (K/uL)   Past Medical History  Diagnosis Date  . Colon cancer     rectal ca  . HTN (hypertension)   . Pulmonary embolism   .  GERD (gastroesophageal reflux disease)   . Depression 04/01/2011   Past Surgical History  Procedure Date  . Esophagogastroduodenoscopy 07/2010    schatki ring s/p dilation, small hh, SB bx benign  . Colonoscopy 07/2010    proximal rectal apple core mass 10-14cm from anal verge (adenocarcinoma), 2-3cm distal rectal carpet polyp s/p piecemeal snare polypectomy (adenoma)  . Eus 08/2010    Dr. Rob Bunting. uT3N0 circumferential, nearly obstruction rectosigmoid adenocarcinoma, distal edge 12cm from anal verge  . Ivc filter   . Colon surgery 11/14/2010    proctectomy with colorectal anastomosis and diverting loop ileostomy (temporary planned)  . Port a cath placement     Current Outpatient Prescriptions  Medication Sig Dispense Refill  . acetaminophen (TYLENOL) 500 MG tablet Take 500 mg by mouth as needed. For pain       . acetaminophen-codeine (TYLENOL #3) 300-30 MG per tablet Take 1 tablet by mouth every 4 (four) hours as needed. For diarrhea.      . ALPRAZolam (XANAX) 0.5 MG tablet Take 0.5 mg by mouth 3 (three) times daily as needed. For anxiety      . cholestyramine (QUESTRAN) 4 G packet 1 packet 2 (two) times daily. To help with Diarrhea .**Do not take within two hours of other medications      . citalopram  (CELEXA) 20 MG tablet Take 1 tablet (20 mg total) by mouth daily. For Depression  30 tablet  3  . dexamethasone (DECADRON) 4 MG tablet Take 4 mg by mouth daily. For 3 days following chemotherapy treatments      . dexlansoprazole (DEXILANT) 60 MG capsule Take 60 mg by mouth every morning. Acid reflux       . diphenoxylate-atropine (LOMOTIL) 2.5-0.025 MG per tablet Take 1 tablet by mouth 3 (three) times daily as needed for diarrhea/loose stools. For diarrhea  90 tablet  0  . LORazepam (ATIVAN) 2 MG tablet Take 2 mg by mouth at bedtime as needed. For sleep      . mirtazapine (REMERON) 15 MG tablet Take 15 mg by mouth at bedtime.        . ondansetron (ZOFRAN) 8 MG tablet Take 8 mg by mouth every 8 (eight) hours as needed. For nausea. **Starts the day of Chemo and follows 2 days after. May take in between as needed for nausea**      . Pancrelipase, Lip-Prot-Amyl, (CREON) 24000 UNITS CPEP Take two capsules with meals, one capsule with snacks.  240 capsule  3  . psyllium (METAMUCIL) 58.6 % powder Take 1 packet by mouth 3 (three) times daily.        . sodium chloride 0.9 % solution Inject 2,000 mLs into the vein continuous.  2000 mL  0  . Tamsulosin HCl (FLOMAX) 0.4 MG CAPS Take 0.4 mg by mouth every morning. For prostate health      . warfarin (COUMADIN) 5 MG tablet Take 5 mg by mouth daily. Except take 7.5mg  (one & one-half tablets) one Tuesdays and Fridays. Takes at 8pm (blood Thinner)          Facility-Administered Medications Ordered in Other Visits  Medication Dose Route Frequency Provider Last Rate Last Dose  . fluorouracil (ADRUCIL) chemo injection 700 mg  700 mg Intravenous Once [employer], PA   700 mg at 04/01/11 1304  . leucovorin injection 42 mg  20 mg/m2 Intravenous Once Francisco J Valls, PHARMD   42 mg at 04/01/11 1303  . oxaliplatin (ELOXATIN) 140 mg in dextrose 5 % 500  mL chemo infusion  140 mg Intravenous Once Dellis Anes, PA   140 mg at 04/01/11 1013  . DISCONTD: fluorouracil  (ADRUCIL) 3,850 mg in sodium chloride 0.9 % 150 mL chemo infusion  3,850 mg Intravenous 1 day or 1 dose Dellis Anes, PA   3,850 mg at 04/01/11 1314    Allergies as of 04/02/2011  . (No Known Allergies)  Review of Systems: Gen: Denies any fever, chills, sweats, anorexia, fatigue, weakness, malaise, weight loss, and sleep disorder CV: Denies chest pain, angina, palpitations, syncope, orthopnea, PND, peripheral edema, and claudication. Resp: Denies dyspnea at rest, dyspnea with exercise, cough, sputum, wheezing, coughing up blood, and pleurisy. GI: Denies vomiting blood, jaundice..   Denies dysphagia or odynophagia. Derm: Denies rash, itching, dry skin, hives, moles, warts, or unhealing ulcers.  Psych: Denies depression, anxiety, memory loss, suicidal ideation, hallucinations, paranoia, and confusion. Heme: Denies bruising, bleeding, and enlarged lymph nodes.  Physical Exam: BP 117/75  Pulse 90  Temp(Src) 97.8 F (36.6 C) (Temporal)  Ht 6\' 1"  (1.854 m)  Wt 193 lb 6.4 oz (87.726 kg)  BMI 25.52 kg/m2 General:   Alert,  Well-developed, well-nourished, pleasant and cooperative in NAD.  Accompanied by his wife today. Head:  Normocephalic and atraumatic. Eyes:  Sclera clear, no icterus.   Conjunctiva pink. Mouth:  No deformity or lesions, oropharynx pink and moist. Neck:  Supple; no masses or thyromegaly. Heart:  Regular rate and rhythm; no murmurs, clicks, rubs,  or gallops. Abdomen:  Soft, nontender and nondistended. Stoma beefy red. Large amount of medium brown to liquid stool in bag. No masses, hepatosplenomegaly or hernias noted. Without guarding and rebound.   Msk:  Symmetrical without gross deformities. Normal posture. Pulses:  Normal pulses noted. Extremities:  Without clubbing or edema. Neurologic:  Alert and  oriented x4;  grossly normal neurologically. Skin:  Intact without significant lesions or rashes. Cervical Nodes:  No significant cervical adenopathy. Psych:  Alert and  cooperative. Normal mood and affect.

## 2011-04-02 NOTE — Assessment & Plan Note (Signed)
Chronic diarrhea complicated by recent diagnosis of colon cancer and chemotherapy. He has failed multiple modalities for treatment for his diarrhea including antibiotics for small bowel bacterial overgrowth, Imodium, Lomotil, fiber, Questran, and pancreatic enzymes.  He does not appear to be dehydrated at this point, and his energy level is up.  Options are limited. I question whether he would be a candidate for Sandostatin off label use for diarrhea and will discuss this further with Dr. Jena Gauss.  In the meantime he is to continue Creon, Imodium and fiber.

## 2011-04-02 NOTE — Progress Notes (Signed)
Charles [name]- Please try and contact patient and let him know I think injections for Sandostatin to [facility]. He may need to come by to learn how to give subcutaneous injections. Thanks

## 2011-04-02 NOTE — Assessment & Plan Note (Signed)
Closely monitored by Dr. Jerelyn Scott.

## 2011-04-02 NOTE — Progress Notes (Signed)
Addended by: Joselyn Arrow on: 04/02/2011 01:14 PM   Modules accepted: Orders

## 2011-04-02 NOTE — Progress Notes (Signed)
Discussed with Dr. Jena Gauss. He agrees with trial of Sandostatin twice a day for one week to see if patient gets any relief. If no relief from diarrhea, patient is going to need referral to [facility]. Attempted to call patient, however his voicemail box is full and call cannot go through.

## 2011-04-02 NOTE — Progress Notes (Signed)
Cc to PCP 

## 2011-04-02 NOTE — Patient Instructions (Signed)
I will be calling you after discussion with Dr. Jena Gauss about new medication for your diarrhea.  Diarrhea Infections caused by germs (bacterial) or a virus commonly cause diarrhea. Your caregiver has determined that with time, rest and fluids, the diarrhea should improve. In general, eat normally while drinking more water than usual. Although water may prevent dehydration, it does not contain salt and minerals (electrolytes). Broths, weak tea without caffeine and oral rehydration solutions (ORS) replace fluids and electrolytes. Small amounts of fluids should be taken frequently. Large amounts at one time may not be tolerated. Plain water may be harmful in infants and the elderly. Oral rehydrating solutions (ORS) are available at pharmacies and grocery stores. ORS replace water and important electrolytes in proper proportions. Sports drinks are not as effective as ORS and may be harmful due to sugars worsening diarrhea.  ORS is especially recommended for use in children with diarrhea. As a general guideline for children, replace any new fluid losses from diarrhea and/or vomiting with ORS as follows:   If your child weighs 22 pounds or under (10 kg or less), give 60-120 mL ( -  cup or 2 - 4 ounces) of ORS for each episode of diarrheal stool or vomiting episode.   If your child weighs more than 22 pounds (more than 10 kgs), give 120-240 mL ( - 1 cup or 4 - 8 ounces) of ORS for each diarrheal stool or episode of vomiting.   While correcting for dehydration, children should eat normally. However, foods high in sugar should be avoided because this may worsen diarrhea. Large amounts of carbonated soft drinks, juice, gelatin desserts and other highly sugared drinks should be avoided.   After correction of dehydration, other liquids that are appealing to the child may be added. Children should drink small amounts of fluids frequently and fluids should be increased as tolerated. Children should drink enough  fluids to keep urine clear or pale yellow.   Adults should eat normally while drinking more fluids than usual. Drink small amounts of fluids frequently and increase as tolerated. Drink enough fluids to keep urine clear or pale yellow. Broths, weak decaffeinated tea, lemon lime soft drinks (allowed to go flat) and ORS replace fluids and electrolytes.   Avoid:   Carbonated drinks.   Juice.   Extremely hot or cold fluids.   Caffeine drinks.   Fatty, greasy foods.   Alcohol.   Tobacco.   Too much intake of anything at one time.   Gelatin desserts.   Probiotics are active cultures of beneficial bacteria. They may lessen the amount and number of diarrheal stools in adults. Probiotics can be found in yogurt with active cultures and in supplements.   Wash hands well to avoid spreading bacteria and virus.   Anti-diarrheal medications are not recommended for infants and children.   Only take over-the-counter or prescription medicines for pain, discomfort or fever as directed by your caregiver. Do not give aspirin to children because it may cause Reye's Syndrome.   For adults, ask your caregiver if you should continue all prescribed and over-the-counter medicines.   If your caregiver has given you a follow-up appointment, it is very important to keep that appointment. Not keeping the appointment could result in a chronic or permanent injury, and disability. If there is any problem keeping the appointment, you must call back to this facility for assistance.  SEEK IMMEDIATE MEDICAL CARE IF:   You or your child is unable to keep fluids down or other symptoms  or problems become worse in spite of treatment.   Vomiting or diarrhea develops and becomes persistent.   There is vomiting of blood or bile (green material).   There is blood in the stool or the stools are black and tarry.   There is no urine output in 6-8 hours or there is only a small amount of very dark urine.   Abdominal  pain develops, increases or localizes.   You have a fever.   Your baby is older than 3 months with a rectal temperature of 102 F (38.9 C) or higher.   Your baby is 73 months old or younger with a rectal temperature of 100.4 F (38 C) or higher.   You or your child develops excessive weakness, dizziness, fainting or extreme thirst.   You or your child develops a rash, stiff neck, severe headache or become irritable or sleepy and difficult to awaken.  MAKE SURE YOU:   Understand these instructions.   Will watch your condition.   Will get help right away if you are not doing well or get worse.  Document Released: 04/11/2002 Document Revised: 01/01/2011 Document Reviewed: 02/26/2009 [facility] Patient Information 2012 [place], [place].

## 2011-04-03 ENCOUNTER — Encounter (HOSPITAL_COMMUNITY): Payer: Medicaid Other

## 2011-04-03 ENCOUNTER — Encounter (HOSPITAL_BASED_OUTPATIENT_CLINIC_OR_DEPARTMENT_OTHER): Payer: Medicaid Other

## 2011-04-03 DIAGNOSIS — C20 Malignant neoplasm of rectum: Secondary | ICD-10-CM

## 2011-04-03 DIAGNOSIS — C189 Malignant neoplasm of colon, unspecified: Secondary | ICD-10-CM

## 2011-04-03 DIAGNOSIS — Z452 Encounter for adjustment and management of vascular access device: Secondary | ICD-10-CM

## 2011-04-03 MED ORDER — HEPARIN SOD (PORK) LOCK FLUSH 100 UNIT/ML IV SOLN
500.0000 [IU] | Freq: Once | INTRAVENOUS | Status: AC | PRN
  Administered 2011-04-03: 500 [IU]
  Filled 2011-04-03: qty 5

## 2011-04-03 MED ORDER — HEPARIN SOD (PORK) LOCK FLUSH 100 UNIT/ML IV SOLN
INTRAVENOUS | Status: AC
  Filled 2011-04-03: qty 5

## 2011-04-03 MED ORDER — SODIUM CHLORIDE 0.9 % IJ SOLN
INTRAMUSCULAR | Status: AC
  Filled 2011-04-03: qty 20

## 2011-04-03 MED ORDER — SODIUM CHLORIDE 0.9 % IJ SOLN
10.0000 mL | INTRAMUSCULAR | Status: DC | PRN
  Administered 2011-04-03: 10 mL
  Filled 2011-04-03: qty 10

## 2011-04-07 ENCOUNTER — Encounter: Payer: Self-pay | Admitting: Urgent Care

## 2011-04-07 NOTE — Progress Notes (Signed)
Pt and his wife came to office this am. rx did not come in prefilled syringes, it came in glass ampules. pts wife feels very uncomfortable giving these injections. She stated she rather him not take them if she has to give them to him. Advised pt I would speak with KJ and let her know and find out what we can do.  Spoke with KJ- she did not know the injections did not come in prefilled syringes. We will need to call pharmacy and see if it comes that way or see if home health can give them. Called [facility] in [place] to see if this was something they were able to do. Spoke with Corky- she will talk with her nurse and call us back.

## 2011-04-07 NOTE — Progress Notes (Signed)
Please send letter written. Thanks

## 2011-04-07 NOTE — Progress Notes (Signed)
Letter faxed to [facility].

## 2011-04-07 NOTE — Progress Notes (Signed)
Charles [name] has not called back, I called [facility] and they said they could not do it. I called [facility] and they said they could do it but we need to send a written order, stating the pt needs help with injections and type of injections. The order needs to be faxed to [phone]. They said it could be sent on our letterhead.

## 2011-04-07 NOTE — Progress Notes (Signed)
pts wife aware.

## 2011-04-08 ENCOUNTER — Other Ambulatory Visit: Payer: Self-pay | Admitting: Urgent Care

## 2011-04-08 MED ORDER — "FILTER NEEDLES 18G X 1-1/2"" MISC": Status: DC

## 2011-04-08 MED ORDER — INSULIN SYRINGES (DISPOSABLE) U-100 1 ML MISC: Status: DC

## 2011-04-09 ENCOUNTER — Telehealth: Payer: Self-pay | Admitting: Urgent Care

## 2011-04-09 NOTE — Telephone Encounter (Signed)
Please have patient call Monday with a progress report on how he is tolerating Sandostatin Call sooner if any problems Thanks

## 2011-04-09 NOTE — Telephone Encounter (Signed)
Pt's wife said that they are feed-up and do not want to do this.She said that in 2-3 months that he will surgery to reversed his problem, so he was just going to keep doing what he has been doing.I call home health to cancel the order and that they did not need to go out there. She is taking the medication that was not open back the drug store. I told them that I was sorry for all the problems that they had to go through and if they needed anything elsa  to please call us.

## 2011-04-09 NOTE — Telephone Encounter (Signed)
Pt's wife called and she is very aggravated that she was told that the medicine for pt would be in prefilled syringes and now the pharmacy is telling her that she will need to purchase needles and their insurance doesn't cover needles.  Pt's wife said if she could return the medicine she would because its been an ordeal all week trying to get this straight. I apologized for her frustrations and assured her that someone would call her back in 10-15 minutes after nurse is finished with her other patient. Please advise.

## 2011-04-09 NOTE — Telephone Encounter (Signed)
Unfortunately pt's has failed standard treatment for refractory diarrhea. This may have been a very helpful option to control his diarrhea & keep him nourished & hydrated. I understand their frustrations w/ insurance & financial concerns along with access to this particular medication. I will call pt tomorrow to see if there is anything else we can offer to help at this point. Thanks

## 2011-04-09 NOTE — Telephone Encounter (Signed)
Pt can not afford the needles and his insurance will not cover them. Please advised what we can do different. He and his wife are getting very up-set.

## 2011-04-10 NOTE — Telephone Encounter (Signed)
Tried to call patient. Mail box is full

## 2011-04-11 NOTE — Telephone Encounter (Signed)
Mailbox still full.

## 2011-04-14 NOTE — Telephone Encounter (Signed)
Spoke with patient's wife. He has not required further fluids for dehydration. He is doing well but continues to have high output from his ostomy.  Do not want to pursue any new medications at this time. Advised to call if any further problems.

## 2011-04-15 ENCOUNTER — Other Ambulatory Visit: Payer: Self-pay | Admitting: Certified Registered Nurse Anesthetist

## 2011-04-15 ENCOUNTER — Encounter (HOSPITAL_COMMUNITY): Payer: Medicaid Other | Attending: Hematology and Oncology

## 2011-04-15 VITALS — BP 123/82 | HR 95 | Temp 97.6°F | Wt 188.6 lb

## 2011-04-15 DIAGNOSIS — F329 Major depressive disorder, single episode, unspecified: Secondary | ICD-10-CM | POA: Insufficient documentation

## 2011-04-15 DIAGNOSIS — C20 Malignant neoplasm of rectum: Secondary | ICD-10-CM

## 2011-04-15 DIAGNOSIS — C189 Malignant neoplasm of colon, unspecified: Secondary | ICD-10-CM

## 2011-04-15 DIAGNOSIS — Z5111 Encounter for antineoplastic chemotherapy: Secondary | ICD-10-CM

## 2011-04-15 DIAGNOSIS — F3289 Other specified depressive episodes: Secondary | ICD-10-CM | POA: Insufficient documentation

## 2011-04-15 LAB — DIFFERENTIAL
Basophils Absolute: 0 10*3/uL (ref 0.0–0.1)
Basophils Relative: 0 % (ref 0–1)
Eosinophils Absolute: 0.1 10*3/uL (ref 0.0–0.7)
Eosinophils Relative: 1 % (ref 0–5)
Lymphocytes Relative: 16 % (ref 12–46)
Lymphs Abs: 1 10*3/uL (ref 0.7–4.0)
Monocytes Absolute: 0.7 10*3/uL (ref 0.1–1.0)
Monocytes Relative: 10 % (ref 3–12)
Neutro Abs: 4.9 10*3/uL (ref 1.7–7.7)
Neutrophils Relative %: 73 % (ref 43–77)

## 2011-04-15 LAB — COMPREHENSIVE METABOLIC PANEL
ALT: 16 U/L (ref 0–53)
AST: 20 U/L (ref 0–37)
Albumin: 3.1 g/dL — ABNORMAL LOW (ref 3.5–5.2)
Alkaline Phosphatase: 77 U/L (ref 39–117)
BUN: 10 mg/dL (ref 6–23)
CO2: 24 mEq/L (ref 19–32)
Calcium: 9.2 mg/dL (ref 8.4–10.5)
Chloride: 105 mEq/L (ref 96–112)
Creatinine, Ser: 0.62 mg/dL (ref 0.50–1.35)
GFR calc Af Amer: >90 mL/min (ref >90)
GFR calc non Af Amer: >90 mL/min (ref >90)
Glucose, Bld: 111 mg/dL — ABNORMAL HIGH (ref 70–99)
Potassium: 4 mEq/L (ref 3.5–5.1)
Sodium: 138 mEq/L (ref 135–145)
Total Bilirubin: 0.9 mg/dL (ref 0.3–1.2)
Total Protein: 6.2 g/dL (ref 6.0–8.3)

## 2011-04-15 LAB — CBC
HCT: 35.4 % — ABNORMAL LOW (ref 39.0–52.0)
Hemoglobin: 11.5 g/dL — ABNORMAL LOW (ref 13.0–17.0)
MCH: 25.9 pg — ABNORMAL LOW (ref 26.0–34.0)
MCHC: 32.5 g/dL (ref 30.0–36.0)
MCV: 79.7 fL (ref 78.0–100.0)
Platelets: 206 10*3/uL (ref 150–400)
RBC: 4.44 MIL/uL (ref 4.22–5.81)
RDW: 17.9 % — ABNORMAL HIGH (ref 11.5–15.5)
WBC: 6.7 10*3/uL (ref 4.0–10.5)

## 2011-04-15 MED ORDER — HEPARIN SOD (PORK) LOCK FLUSH 100 UNIT/ML IV SOLN
500.0000 [IU] | Freq: Once | INTRAVENOUS | Status: DC | PRN
  Filled 2011-04-15: qty 5

## 2011-04-15 MED ORDER — DEXTROSE 5 % IV SOLN
Freq: Once | INTRAVENOUS | Status: DC
  Filled 2011-04-15: qty 1000

## 2011-04-15 MED ORDER — DEXAMETHASONE SODIUM PHOSPHATE 10 MG/ML IJ SOLN: 10.0000 mg | Freq: Once | INTRAMUSCULAR | Status: DC

## 2011-04-15 MED ORDER — FLUOROURACIL CHEMO INJECTION 2.5 GM/50ML
700.0000 mg | Freq: Once | INTRAVENOUS | Status: AC
  Administered 2011-04-15: 700 mg via INTRAVENOUS
  Filled 2011-04-15: qty 14

## 2011-04-15 MED ORDER — DEXTROSE 5 % IV SOLN
50.0000 mL | INTRAVENOUS | Status: AC
  Administered 2011-04-15: 50 mL via INTRAVENOUS
  Filled 2011-04-15 (×2): qty 50

## 2011-04-15 MED ORDER — LEUCOVORIN CALCIUM INJECTION 350 MG: 660.0000 mg | Freq: Once | INTRAVENOUS | Status: DC

## 2011-04-15 MED ORDER — LEUCOVORIN CALCIUM INJECTION 100 MG
42.0000 mg | Freq: Once | INTRAMUSCULAR | Status: AC
  Administered 2011-04-15: 42 mg via INTRAVENOUS
  Filled 2011-04-15: qty 2.1

## 2011-04-15 MED ORDER — OXALIPLATIN CHEMO INJECTION 100 MG/20ML
140.0000 mg | Freq: Once | INTRAVENOUS | Status: AC
  Administered 2011-04-15: 140 mg via INTRAVENOUS
  Filled 2011-04-15: qty 28

## 2011-04-15 MED ORDER — SODIUM CHLORIDE 0.9 % IV SOLN
INTRAVENOUS | Status: DC
  Administered 2011-04-15: 500 mL via INTRAVENOUS

## 2011-04-15 MED ORDER — SODIUM CHLORIDE 0.9 % IV SOLN: 8.0000 mg | Freq: Once | INTRAVENOUS | Status: DC

## 2011-04-15 MED ORDER — SODIUM CHLORIDE 0.9 % IV SOLN
Freq: Once | INTRAVENOUS | Status: AC
  Administered 2011-04-15: 8 mg via INTRAVENOUS
  Filled 2011-04-15: qty 4

## 2011-04-15 MED ORDER — SODIUM CHLORIDE 0.9 % IV SOLN
3850.0000 mg | INTRAVENOUS | Status: DC
  Administered 2011-04-15: 3850 mg via INTRAVENOUS
  Filled 2011-04-15 (×2): qty 77

## 2011-04-17 ENCOUNTER — Encounter (HOSPITAL_BASED_OUTPATIENT_CLINIC_OR_DEPARTMENT_OTHER): Payer: Medicaid Other

## 2011-04-17 DIAGNOSIS — C20 Malignant neoplasm of rectum: Secondary | ICD-10-CM

## 2011-04-17 DIAGNOSIS — C189 Malignant neoplasm of colon, unspecified: Secondary | ICD-10-CM

## 2011-04-17 MED ORDER — HEPARIN SOD (PORK) LOCK FLUSH 100 UNIT/ML IV SOLN
INTRAVENOUS | Status: AC
  Administered 2011-04-17: 500 [IU]
  Filled 2011-04-17: qty 5

## 2011-04-17 MED ORDER — SODIUM CHLORIDE 0.9 % IJ SOLN
INTRAMUSCULAR | Status: AC
  Filled 2011-04-17: qty 10

## 2011-04-17 MED ORDER — HEPARIN SOD (PORK) LOCK FLUSH 100 UNIT/ML IV SOLN
500.0000 [IU] | Freq: Once | INTRAVENOUS | Status: AC | PRN
  Administered 2011-04-17: 500 [IU]
  Filled 2011-04-17: qty 5

## 2011-04-17 MED ORDER — SODIUM CHLORIDE 0.9 % IJ SOLN
10.0000 mL | INTRAMUSCULAR | Status: DC | PRN
  Administered 2011-04-17: 10 mL
  Filled 2011-04-17: qty 10

## 2011-04-17 NOTE — Progress Notes (Signed)
Charles [name] presented for Portacath access and flush. Proper placement of portacath confirmed by CXR. Portacath located right chest wall already accessed with  H 20 needle. Portacath flushed with 20ml NS and 500U/2ml Heparin and needle removed intact. Procedure without incident. Patient tolerated procedure well.

## 2011-04-18 ENCOUNTER — Telehealth (HOSPITAL_COMMUNITY): Payer: Self-pay | Admitting: *Deleted

## 2011-04-18 NOTE — Telephone Encounter (Signed)
Attempted 24 hr chemo follow-up but no answer and no way to leave message.

## 2011-04-30 ENCOUNTER — Ambulatory Visit (HOSPITAL_COMMUNITY): Payer: Medicaid Other | Admitting: Oncology

## 2011-04-30 ENCOUNTER — Encounter (HOSPITAL_BASED_OUTPATIENT_CLINIC_OR_DEPARTMENT_OTHER): Payer: Medicaid Other

## 2011-04-30 VITALS — Ht 73.0 in | Wt 188.6 lb

## 2011-04-30 DIAGNOSIS — C20 Malignant neoplasm of rectum: Secondary | ICD-10-CM

## 2011-04-30 DIAGNOSIS — C189 Malignant neoplasm of colon, unspecified: Secondary | ICD-10-CM

## 2011-04-30 DIAGNOSIS — Z5111 Encounter for antineoplastic chemotherapy: Secondary | ICD-10-CM

## 2011-04-30 LAB — BASIC METABOLIC PANEL
BUN: 6 mg/dL (ref 6–23)
CO2: 22 mEq/L (ref 19–32)
Calcium: 9.1 mg/dL (ref 8.4–10.5)
Chloride: 101 mEq/L (ref 96–112)
Creatinine, Ser: 0.73 mg/dL (ref 0.50–1.35)
GFR calc Af Amer: >90 mL/min (ref >90)
GFR calc non Af Amer: >90 mL/min (ref >90)
Glucose, Bld: 143 mg/dL — ABNORMAL HIGH (ref 70–99)
Potassium: 3.4 mEq/L — ABNORMAL LOW (ref 3.5–5.1)
Sodium: 134 mEq/L — ABNORMAL LOW (ref 135–145)

## 2011-04-30 LAB — CBC
HCT: 35.4 % — ABNORMAL LOW (ref 39.0–52.0)
Hemoglobin: 11.6 g/dL — ABNORMAL LOW (ref 13.0–17.0)
MCH: 25.8 pg — ABNORMAL LOW (ref 26.0–34.0)
MCHC: 32.8 g/dL (ref 30.0–36.0)
MCV: 78.7 fL (ref 78.0–100.0)
Platelets: 213 10*3/uL (ref 150–400)
RBC: 4.5 MIL/uL (ref 4.22–5.81)
RDW: 18 % — ABNORMAL HIGH (ref 11.5–15.5)
WBC: 7.2 10*3/uL (ref 4.0–10.5)

## 2011-04-30 LAB — DIFFERENTIAL
Basophils Absolute: 0 10*3/uL (ref 0.0–0.1)
Basophils Relative: 0 % (ref 0–1)
Eosinophils Absolute: 0.1 10*3/uL (ref 0.0–0.7)
Eosinophils Relative: 1 % (ref 0–5)
Lymphocytes Relative: 14 % (ref 12–46)
Lymphs Abs: 1 10*3/uL (ref 0.7–4.0)
Monocytes Absolute: 1 10*3/uL (ref 0.1–1.0)
Monocytes Relative: 13 % — ABNORMAL HIGH (ref 3–12)
Neutro Abs: 5.1 10*3/uL (ref 1.7–7.7)
Neutrophils Relative %: 71 % (ref 43–77)

## 2011-04-30 MED ORDER — DEXTROSE 5 % IV SOLN
50.0000 mL | INTRAVENOUS | Status: AC
  Administered 2011-04-30: 50 mL via INTRAVENOUS
  Filled 2011-04-30 (×2): qty 50

## 2011-04-30 MED ORDER — OXALIPLATIN CHEMO INJECTION 100 MG/20ML
140.0000 mg | Freq: Once | INTRAVENOUS | Status: AC
  Administered 2011-04-30: 140 mg via INTRAVENOUS
  Filled 2011-04-30: qty 28

## 2011-04-30 MED ORDER — FLUOROURACIL CHEMO INJECTION 5 GM/100ML
3850.0000 mg | INTRAVENOUS | Status: DC
  Administered 2011-04-30: 3850 mg via INTRAVENOUS
  Filled 2011-04-30 (×2): qty 77

## 2011-04-30 MED ORDER — ONDANSETRON HCL 4 MG/2ML IJ SOLN
Freq: Once | INTRAMUSCULAR | Status: AC
  Administered 2011-04-30: 8 mg via INTRAVENOUS
  Filled 2011-04-30: qty 4

## 2011-04-30 MED ORDER — LEUCOVORIN CALCIUM INJECTION 100 MG
20.0000 mg/m2 | Freq: Once | INTRAMUSCULAR | Status: AC
  Administered 2011-04-30: 42 mg via INTRAVENOUS
  Filled 2011-04-30: qty 2.1

## 2011-04-30 MED ORDER — LEUCOVORIN CALCIUM INJECTION 350 MG: 660.0000 mg | Freq: Once | INTRAMUSCULAR | Status: DC

## 2011-04-30 MED ORDER — DEXAMETHASONE SODIUM PHOSPHATE 10 MG/ML IJ SOLN: 10.0000 mg | Freq: Once | INTRAMUSCULAR | Status: DC

## 2011-04-30 MED ORDER — SODIUM CHLORIDE 0.9 % IV SOLN: 8.0000 mg | Freq: Once | INTRAVENOUS | Status: DC

## 2011-04-30 MED ORDER — FLUOROURACIL CHEMO INJECTION 2.5 GM/50ML: 660.0000 mg | Freq: Once | INTRAVENOUS | Status: DC

## 2011-04-30 MED ORDER — FLUOROURACIL CHEMO INJECTION 2.5 GM/50ML
700.0000 mg | Freq: Once | INTRAVENOUS | Status: AC
  Administered 2011-04-30: 700 mg via INTRAVENOUS
  Filled 2011-04-30: qty 14

## 2011-05-01 ENCOUNTER — Ambulatory Visit (HOSPITAL_COMMUNITY): Payer: Medicaid Other | Admitting: Oncology

## 2011-05-02 ENCOUNTER — Encounter (HOSPITAL_BASED_OUTPATIENT_CLINIC_OR_DEPARTMENT_OTHER): Payer: Medicaid Other

## 2011-05-02 ENCOUNTER — Encounter (HOSPITAL_COMMUNITY): Payer: Medicaid Other | Admitting: Oncology

## 2011-05-02 DIAGNOSIS — F329 Major depressive disorder, single episode, unspecified: Secondary | ICD-10-CM

## 2011-05-02 DIAGNOSIS — Z452 Encounter for adjustment and management of vascular access device: Secondary | ICD-10-CM

## 2011-05-02 DIAGNOSIS — C189 Malignant neoplasm of colon, unspecified: Secondary | ICD-10-CM

## 2011-05-02 DIAGNOSIS — C20 Malignant neoplasm of rectum: Secondary | ICD-10-CM

## 2011-05-02 DIAGNOSIS — F32A Depression, unspecified: Secondary | ICD-10-CM

## 2011-05-02 MED ORDER — SODIUM CHLORIDE 0.9 % IJ SOLN
10.0000 mL | INTRAMUSCULAR | Status: DC | PRN
  Filled 2011-05-02: qty 10

## 2011-05-02 MED ORDER — HEPARIN SOD (PORK) LOCK FLUSH 100 UNIT/ML IV SOLN
500.0000 [IU] | Freq: Once | INTRAVENOUS | Status: AC
  Administered 2011-05-02: 500 [IU] via INTRAVENOUS
  Filled 2011-05-02: qty 5

## 2011-05-02 MED ORDER — CITALOPRAM HYDROBROMIDE 20 MG PO TABS: 20.0000 mg | ORAL_TABLET | Freq: Every day | ORAL | Status: DC

## 2011-05-02 MED ORDER — SODIUM CHLORIDE 0.9 % IJ SOLN
INTRAMUSCULAR | Status: AC
  Administered 2011-05-02: 10 mL
  Filled 2011-05-02: qty 10

## 2011-05-02 MED ORDER — HEPARIN SOD (PORK) LOCK FLUSH 100 UNIT/ML IV SOLN
INTRAVENOUS | Status: AC
  Filled 2011-05-02: qty 5

## 2011-05-02 NOTE — Progress Notes (Signed)
Charles [name] presented for infusion pump d/c and Portacath flush. Proper placement of portacath confirmed by CXR. Portacath located right chest wall already accessed with  H 20 needle. Good blood return present. Portacath flushed with 20ml NS and 500U/70ml Heparin and needle removed intact. Procedure without incident. Patient tolerated procedure well.

## 2011-05-09 ENCOUNTER — Encounter (HOSPITAL_COMMUNITY): Payer: Medicaid Other | Attending: Hematology and Oncology | Admitting: Oncology

## 2011-05-09 ENCOUNTER — Encounter (HOSPITAL_COMMUNITY): Payer: Self-pay | Admitting: Oncology

## 2011-05-09 VITALS — BP 101/69 | HR 125 | Temp 97.8°F | Wt 181.4 lb

## 2011-05-09 DIAGNOSIS — J3489 Other specified disorders of nose and nasal sinuses: Secondary | ICD-10-CM | POA: Insufficient documentation

## 2011-05-09 DIAGNOSIS — R5381 Other malaise: Secondary | ICD-10-CM

## 2011-05-09 DIAGNOSIS — F3289 Other specified depressive episodes: Secondary | ICD-10-CM | POA: Insufficient documentation

## 2011-05-09 DIAGNOSIS — C20 Malignant neoplasm of rectum: Secondary | ICD-10-CM

## 2011-05-09 DIAGNOSIS — E871 Hypo-osmolality and hyponatremia: Secondary | ICD-10-CM | POA: Insufficient documentation

## 2011-05-09 DIAGNOSIS — R634 Abnormal weight loss: Secondary | ICD-10-CM | POA: Insufficient documentation

## 2011-05-09 DIAGNOSIS — I82409 Acute embolism and thrombosis of unspecified deep veins of unspecified lower extremity: Secondary | ICD-10-CM | POA: Insufficient documentation

## 2011-05-09 DIAGNOSIS — R198 Other specified symptoms and signs involving the digestive system and abdomen: Secondary | ICD-10-CM | POA: Insufficient documentation

## 2011-05-09 DIAGNOSIS — F329 Major depressive disorder, single episode, unspecified: Secondary | ICD-10-CM | POA: Insufficient documentation

## 2011-05-09 DIAGNOSIS — Z932 Ileostomy status: Secondary | ICD-10-CM | POA: Insufficient documentation

## 2011-05-09 DIAGNOSIS — R5383 Other fatigue: Secondary | ICD-10-CM

## 2011-05-09 DIAGNOSIS — E860 Dehydration: Secondary | ICD-10-CM | POA: Insufficient documentation

## 2011-05-09 DIAGNOSIS — C189 Malignant neoplasm of colon, unspecified: Secondary | ICD-10-CM | POA: Insufficient documentation

## 2011-05-09 DIAGNOSIS — R197 Diarrhea, unspecified: Secondary | ICD-10-CM

## 2011-05-09 HISTORY — DX: Acute embolism and thrombosis of unspecified deep veins of unspecified lower extremity: I82.409

## 2011-05-09 LAB — PROTIME-INR
INR: 3.07 — ABNORMAL HIGH (ref 0.00–1.49)
Prothrombin Time: 32.2 seconds — ABNORMAL HIGH (ref 11.6–15.2)

## 2011-05-09 LAB — BASIC METABOLIC PANEL
BUN: 16 mg/dL (ref 6–23)
CO2: 20 mEq/L (ref 19–32)
Calcium: 9.9 mg/dL (ref 8.4–10.5)
Chloride: 98 mEq/L (ref 96–112)
Creatinine, Ser: 0.83 mg/dL (ref 0.50–1.35)
GFR calc Af Amer: >90 mL/min (ref >90)
GFR calc non Af Amer: >90 mL/min (ref >90)
Glucose, Bld: 141 mg/dL — ABNORMAL HIGH (ref 70–99)
Potassium: 4.4 mEq/L (ref 3.5–5.1)
Sodium: 128 mEq/L — ABNORMAL LOW (ref 135–145)

## 2011-05-09 LAB — DIFFERENTIAL
Basophils Absolute: 0 10*3/uL (ref 0.0–0.1)
Basophils Relative: 0 % (ref 0–1)
Eosinophils Absolute: 0.1 10*3/uL (ref 0.0–0.7)
Eosinophils Relative: 1 % (ref 0–5)
Lymphocytes Relative: 17 % (ref 12–46)
Lymphs Abs: 1.2 10*3/uL (ref 0.7–4.0)
Monocytes Absolute: 1.2 10*3/uL — ABNORMAL HIGH (ref 0.1–1.0)
Monocytes Relative: 16 % — ABNORMAL HIGH (ref 3–12)
Neutro Abs: 4.7 10*3/uL (ref 1.7–7.7)
Neutrophils Relative %: 66 % (ref 43–77)

## 2011-05-09 LAB — CBC
HCT: 38.8 % — ABNORMAL LOW (ref 39.0–52.0)
Hemoglobin: 12.6 g/dL — ABNORMAL LOW (ref 13.0–17.0)
MCH: 25.5 pg — ABNORMAL LOW (ref 26.0–34.0)
MCHC: 32.5 g/dL (ref 30.0–36.0)
MCV: 78.4 fL (ref 78.0–100.0)
Platelets: 344 10*3/uL (ref 150–400)
RBC: 4.95 MIL/uL (ref 4.22–5.81)
RDW: 18 % — ABNORMAL HIGH (ref 11.5–15.5)
WBC: 7.1 10*3/uL (ref 4.0–10.5)

## 2011-05-09 MED ORDER — WARFARIN SODIUM 1 MG PO TABS: 1.0000 mg | ORAL_TABLET | ORAL | Status: DC

## 2011-05-09 NOTE — Patient Instructions (Signed)
[facility]  Discharge Instructions  RECOMMENDATIONS MADE BY THE CONSULTANT AND ANY TEST RESULTS WILL BE SENT TO YOUR REFERRING DOCTOR.   EXAM FINDINGS BY MD TODAY AND SIGNS AND SYMPTOMS TO REPORT TO CLINIC OR PRIMARY MD:   Please return Monday 05/12/11 @ 9:30 for coumadin check  Please return Friday 05/16/11 @ 11:30 for coumadin check and prechemo labs then to see Tom @ 12:00  Please return Monday 05/19/11 @ 9:15 for chemo  Pump disconnect on Wednesday 05/21/11 when the nurse tells you the appointment time   Medication Instructions:  Tonight, take 6mg  of coumadin. Tomorrow night take 7.5mg  of coumadin. You will alternate this everyday.   Do not take anymore Lovenox!!!!  You may take aleve/ibuprofen for pain as well as your other pain medication.     I acknowledge that I have been informed and understand all the instructions given to me and received a copy. I do not have any more questions at this time, but understand that I may call the Specialty [facility] at [facility] at [phone] during business hours should I have any further questions or need assistance in obtaining follow-up care.    __________________________________________  _____________  __________ Signature of Patient or Authorized Representative            Date                   Time    __________________________________________ Nurse's Signature

## 2011-05-09 NOTE — Progress Notes (Signed)
Ignatius [name]., MD, MD [address] [place] [address]  1. Colon cancer  CBC, Differential, Basic metabolic panel, CBC, Differential, Basic metabolic panel  2. DVT (deep venous thrombosis)  Protime-INR, Protime-INR    CURRENT THERAPY:S/P cycle 7 of FOLFOX starting on 02/05/11.  S/P proctectomy with colorectal anastomosis and diverting loop ileostomy on 11/14/10 at [facility] by Dr. Rae Halsted.    INTERVAL HISTORY: Charles [name] 61 y.o. male returns for  regular  visit for followup of  Stage III colorectal cancer.  The patient reported to [facility] on [facility] day for Right LE pain.  It was discovered that he had a DVT from his right groin to popliteal region (per conversation with the patient).  The hospital increased his Coumadin to 7.5 for two days and then return to his usual Coumadin dosage described below.  His PCP is managing his Coumadin.  He reports that his INR was 1.2 one week prior to his hospital visit.  The patient was already on Coumadin 5 mg daily with two days out of the week of 7.5 mg.  He was on this anticoagulation for a PE discovered in May.  He was then found to have colorectal cancer and underwent surgical resection with ileostomy in July 2012.    Otherwise, the patient feels poorly.  He reports increased depression, fatigue, dizziness, decreased appetite, and feeling poorly.  I believe all of these symptoms are interrelated.  We will need to monitor the patient and see if these symptoms resolve before his next cycle of chemotherapy.  It is difficult to determine whether this is chemotherapy induced or from his hospital visit and DVT.  The patient denies any chest pain. SOB, sudden onset of SOB, and cough.  We are temporarily going to manage the patient's Coumadin.  The patient has had one instance of a life-threatening PE and we need to maintain the patient in the therapeutic range.  We will ascertain some lab work today including a CBC diff, BMET, and  PT/INR.  If the patient is subtherapeutic, we will initiate Lovenox 1.5 mg/kg and increase his Coumadin.   Lab work returned and his INR is 3.07.  We will alter his Coumadin dose to 6 mg / 7.5 mg alternating.  Will perform a PT/INR on Monday and again on Friday.  Will return to the clinic on Friday for follow-up and Pre-chemo lab work.  We will defer chemotherapy by 1 week.   Patient informed that he may take his pain medications at home for his DVT discomfort.    Past Medical History  Diagnosis Date  . Colon cancer     rectal ca  . HTN (hypertension)   . Pulmonary embolism   . GERD (gastroesophageal reflux disease)   . Depression 04/01/2011  . DVT (deep venous thrombosis) 05/09/2011    has GERD; DIARRHEA; Colon cancer; Weight loss, abnormal; Dehydration; Depression; and DVT (deep venous thrombosis) on his problem list.      has no known allergies.  Charles [name] does not currently have medications on file.  Past Surgical History  Procedure Date  . Esophagogastroduodenoscopy 07/2010    schatki ring s/p dilation, small hh, SB bx benign  . Colonoscopy 07/2010    proximal rectal apple core mass 10-14cm from anal verge (adenocarcinoma), 2-3cm distal rectal carpet polyp s/p piecemeal snare polypectomy (adenoma)  . Eus 08/2010    Dr. Rob Bunting. uT3N0 circumferential, nearly obstruction rectosigmoid adenocarcinoma, distal edge 12cm from anal verge  .  Ivc filter   . Colon surgery 11/14/2010    proctectomy with colorectal anastomosis and diverting loop ileostomy (temporary planned)  . Port a cath placement     Denies any headaches, dizziness, double vision, fevers, chills, night sweats, nausea, vomiting, diarrhea, constipation, chest pain, heart palpitations, shortness of breath, blood in stool, black tarry stool, urinary pain, urinary burning, urinary frequency, hematuria.   PHYSICAL EXAMINATION  ECOG PERFORMANCE STATUS: 2 - Symptomatic, <50% confined to bed  Filed Vitals:    05/09/11 1511  BP: 101/69  Pulse: 125  Temp: 97.8 F (36.6 C)    GENERAL:alert, well nourished, well developed and cooperative SKIN: skin color, texture, turgor are normal HEAD: Normocephalic EYES: normal EARS: External ears normal OROPHARYNX:mucous membranes are moist  NECK: supple, trachea midline LYMPH:  no palpable lymphadenopathy BREAST:not examined LUNGS: clear to auscultation and percussion HEART: regular rate & rhythm, no murmurs, no gallops, S1 normal and S2 normal ABDOMEN:abdomen soft, non-tender and normal bowel sounds BACK: Back symmetric, no curvature., No CVA tenderness EXTREMITIES:less then 2 second capillary refill, no joint deformities, effusion, or inflammation, no edema, no skin discoloration, no clubbing, no cyanosis  NEURO: alert & oriented x 3 with fluent speech, no focal motor/sensory deficits, gait normal   LABORATORY DATA: CBC    Component Value Date/Time   WBC 7.1 05/09/2011 1604   WBC 6.5 02/04/2011 1059   RBC 4.95 05/09/2011 1604   RBC 4.57 02/04/2011 1059   HGB 12.6* 05/09/2011 1604   HGB 11.6* 02/04/2011 1059   HCT 38.8* 05/09/2011 1604   HCT 34.7* 02/04/2011 1059   PLT 344 05/09/2011 1604   PLT 282 02/04/2011 1059   MCV 78.4 05/09/2011 1604   MCV 76.0* 02/04/2011 1059   MCH 25.5* 05/09/2011 1604   MCH 25.4* 02/04/2011 1059   MCHC 32.5 05/09/2011 1604   MCHC 33.5 02/04/2011 1059   RDW 18.0* 05/09/2011 1604   RDW 18.5* 02/04/2011 1059   LYMPHSABS 1.2 05/09/2011 1604   LYMPHSABS 1.1 02/04/2011 1059   MONOABS 1.2* 05/09/2011 1604   MONOABS 0.5 02/04/2011 1059   EOSABS 0.1 05/09/2011 1604   EOSABS 0.1 02/04/2011 1059   BASOSABS 0.0 05/09/2011 1604   BASOSABS 0.1 02/04/2011 1059      Chemistry      Component Value Date/Time   NA 128* 05/09/2011 1604   NA 136 10/30/2010 0801   K 4.4 05/09/2011 1604   K 4.0 10/30/2010 0801   CL 98 05/09/2011 1604   CL 101 10/30/2010 0801   CO2 20 05/09/2011 1604   CO2 27 10/30/2010 0801   BUN 16 05/09/2011 1604   BUN 9 10/30/2010 0801    CREATININE 0.83 05/09/2011 1604   CREATININE 0.83 02/10/2011 1037      Component Value Date/Time   CALCIUM 9.9 05/09/2011 1604   CALCIUM 8.9 10/30/2010 0801   ALKPHOS 77 04/15/2011 0933   ALKPHOS 89* 10/30/2010 0801   AST 20 04/15/2011 0933   AST 17 10/30/2010 0801   ALT 16 04/15/2011 0933   BILITOT 0.9 04/15/2011 0933   BILITOT 1.00 10/30/2010 0801     Lab Results  Component Value Date   INR 3.07* 05/09/2011   INR 0.92 07/16/2010      ASSESSMENT: 1. Stage III Rectal cancer, S/P radiation and chemotherapy, then surgical resection, now on FOLFOX chemotherapy.  2. S/P IVC filter placement  3. On Coumadin anticoagulation for PE in the past.  4. Chronic diarrhea, followed by gastroenterology.  5. Depression, on citalopram with  good results 6. DVT dx on May 06 2011 while on Coumadin, but subtherapeutic 1 week prior to Hospital visit at 1.2 according to the patient.  7. Poor appetite 8. Dizziness 8. Fatigue    PLAN:  1. We will temporarily manage the patient's Coumadin until he is perfectly therapeutic and stable. 2. We will revamp his Coumadin dose to the following: 6 mg alternating with 7.5 mg.  He will begin tonight with 6 mg. 3. Lab work today: CBC diff, BMET, PT/INR 4. Lab work on Monday: PT/INR 5. Defer chemotherapy by 7 days.   6. Return on Friday for follow-up visit to make sure he is feeling better before moving on with next cycle of chemotherapy.  Will also perform Pre-chemo lab work at that time and also another PT/INR check.   7. INR goal is 2.0-3.5 8. Called in Coumadin 1 mg tablets #30 with one refill to [facility] [place], [place]. 9. Will attain records from [facility] 10. We need to bear in mind that small clots may be breaking off of DVT in R LE and causing small PEs despite the IVC Filter.  This is a possibility, but the patient is therapeutic today.  We will continue to treat the patient with anticoagulation within our goal INR. 11. Patient informed that he may take pain  medications he has at home for LE discomfort associated with the DVT.   All questions were answered. The patient knows to call the clinic with any problems, questions or concerns. We can certainly see the patient much sooner if necessary.  The patient and plan discussed with Glenford Peers, MD and he is in agreement with the aforementioned. Patient seen and examined by Dr. Glenford Peers.   I spent 40 minutes counseling the patient face to face. The total time spent in the appointment was 55 minutes.  KEFALAS,THOMAS

## 2011-05-12 ENCOUNTER — Inpatient Hospital Stay (HOSPITAL_COMMUNITY): Payer: Medicaid Other

## 2011-05-12 ENCOUNTER — Encounter (HOSPITAL_BASED_OUTPATIENT_CLINIC_OR_DEPARTMENT_OTHER): Payer: Medicaid Other | Admitting: Oncology

## 2011-05-12 ENCOUNTER — Encounter (HOSPITAL_COMMUNITY): Payer: Medicaid Other

## 2011-05-12 VITALS — BP 93/71 | HR 112 | Temp 98.6°F | Ht 71.0 in | Wt 181.6 lb

## 2011-05-12 DIAGNOSIS — C189 Malignant neoplasm of colon, unspecified: Secondary | ICD-10-CM

## 2011-05-12 DIAGNOSIS — Z5111 Encounter for antineoplastic chemotherapy: Secondary | ICD-10-CM

## 2011-05-12 DIAGNOSIS — I82409 Acute embolism and thrombosis of unspecified deep veins of unspecified lower extremity: Secondary | ICD-10-CM

## 2011-05-12 DIAGNOSIS — C20 Malignant neoplasm of rectum: Secondary | ICD-10-CM

## 2011-05-12 DIAGNOSIS — E871 Hypo-osmolality and hyponatremia: Secondary | ICD-10-CM

## 2011-05-12 LAB — COMPREHENSIVE METABOLIC PANEL
ALT: 28 U/L (ref 0–53)
AST: 22 U/L (ref 0–37)
Albumin: 3.1 g/dL — ABNORMAL LOW (ref 3.5–5.2)
Alkaline Phosphatase: 160 U/L — ABNORMAL HIGH (ref 39–117)
BUN: 12 mg/dL (ref 6–23)
CO2: 22 mEq/L (ref 19–32)
Calcium: 10.2 mg/dL (ref 8.4–10.5)
Chloride: 93 mEq/L — ABNORMAL LOW (ref 96–112)
Creatinine, Ser: 0.9 mg/dL (ref 0.50–1.35)
GFR calc Af Amer: >90 mL/min (ref >90)
GFR calc non Af Amer: >90 mL/min (ref >90)
Glucose, Bld: 136 mg/dL — ABNORMAL HIGH (ref 70–99)
Potassium: 4.9 mEq/L (ref 3.5–5.1)
Sodium: 127 mEq/L — ABNORMAL LOW (ref 135–145)
Total Bilirubin: 1.1 mg/dL (ref 0.3–1.2)
Total Protein: 7.4 g/dL (ref 6.0–8.3)

## 2011-05-12 LAB — CBC
HCT: 40.2 % (ref 39.0–52.0)
Hemoglobin: 13.4 g/dL (ref 13.0–17.0)
MCH: 26.1 pg (ref 26.0–34.0)
MCHC: 33.3 g/dL (ref 30.0–36.0)
MCV: 78.2 fL (ref 78.0–100.0)
Platelets: 323 10*3/uL (ref 150–400)
RBC: 5.14 MIL/uL (ref 4.22–5.81)
RDW: 18 % — ABNORMAL HIGH (ref 11.5–15.5)
WBC: 11.2 10*3/uL — ABNORMAL HIGH (ref 4.0–10.5)

## 2011-05-12 LAB — DIFFERENTIAL
Basophils Absolute: 0 10*3/uL (ref 0.0–0.1)
Basophils Relative: 0 % (ref 0–1)
Eosinophils Absolute: 0 10*3/uL (ref 0.0–0.7)
Eosinophils Relative: 0 % (ref 0–5)
Lymphocytes Relative: 11 % — ABNORMAL LOW (ref 12–46)
Lymphs Abs: 1.2 10*3/uL (ref 0.7–4.0)
Monocytes Absolute: 1.2 10*3/uL — ABNORMAL HIGH (ref 0.1–1.0)
Monocytes Relative: 10 % (ref 3–12)
Neutro Abs: 8.7 10*3/uL — ABNORMAL HIGH (ref 1.7–7.7)
Neutrophils Relative %: 78 % — ABNORMAL HIGH (ref 43–77)

## 2011-05-12 LAB — PROTIME-INR
INR: 3.36 — ABNORMAL HIGH (ref 0.00–1.49)
Prothrombin Time: 34.5 seconds — ABNORMAL HIGH (ref 11.6–15.2)

## 2011-05-12 MED ORDER — HEPARIN SOD (PORK) LOCK FLUSH 100 UNIT/ML IV SOLN
INTRAVENOUS | Status: AC
  Administered 2011-05-12: 500 [IU] via INTRAVENOUS
  Filled 2011-05-12: qty 5

## 2011-05-12 MED ORDER — HEPARIN SOD (PORK) LOCK FLUSH 100 UNIT/ML IV SOLN
500.0000 [IU] | Freq: Once | INTRAVENOUS | Status: AC
  Administered 2011-05-12: 500 [IU] via INTRAVENOUS
  Filled 2011-05-12: qty 5

## 2011-05-12 MED ORDER — SODIUM CHLORIDE 0.9 % IV SOLN
INTRAVENOUS | Status: DC
  Administered 2011-05-12: 10:00:00 via INTRAVENOUS

## 2011-05-12 NOTE — Progress Notes (Signed)
Returns today after MD visit on Friday. No better, continues to be very weak. Attempted orthostatics but pt became to tired standing and had to sit before BP machine completed. States drinking 4-5 glasses fluid daily.

## 2011-05-12 NOTE — Progress Notes (Signed)
Labs drawn today for cbc/diff,cmp,pt.  Patient on 6 and 7.5mg  alternating.  Call patient ay [phone]

## 2011-05-12 NOTE — Progress Notes (Signed)
Subjective: The patient was seen today for a PT/INR check and he reported to the nurse that he feels poor.   The patient was seen in the exam room.  He reports fatigue and an overall poor feeling.  He denies any fevers and chills.  He does report a cough that is non-productive, but he does not feel ill.  He denies any arthralgias and myalgias.    He reports that he get "worn down" after walking short distances.   Will perform lab work today: CBC diff, CMET, PT/INR   Patient will hold Coumadin today and then tomorrow he will continue 6 mg daily.  Will give the patient 1 L NS today for hyponatremia and hypochloridemia.   Objective: BP 93/71  Pulse 112  Temp(Src) 98.6 F (37 C) (Oral)  Ht 5\' 11"  (1.803 m)  Wt 181 lb 9.6 oz (82.373 kg)  BMI 25.33 kg/m2 General:  Patient seen in chair in a supine position. Neck: Trachea midline Abdomen: Ileostomy producing loose stool. Skin: Warm and dry Neuro:  A and O x 3.  No focal deficits noted.  CBC    Component Value Date/Time   WBC 11.2* 05/12/2011 0847   WBC 6.5 02/04/2011 1059   RBC 5.14 05/12/2011 0847   RBC 4.57 02/04/2011 1059   HGB 13.4 05/12/2011 0847   HGB 11.6* 02/04/2011 1059   HCT 40.2 05/12/2011 0847   HCT 34.7* 02/04/2011 1059   PLT 323 05/12/2011 0847   PLT 282 02/04/2011 1059   MCV 78.2 05/12/2011 0847   MCV 76.0* 02/04/2011 1059   MCH 26.1 05/12/2011 0847   MCH 25.4* 02/04/2011 1059   MCHC 33.3 05/12/2011 0847   MCHC 33.5 02/04/2011 1059   RDW 18.0* 05/12/2011 0847   RDW 18.5* 02/04/2011 1059   LYMPHSABS 1.2 05/12/2011 0847   LYMPHSABS 1.1 02/04/2011 1059   MONOABS 1.2* 05/12/2011 0847   MONOABS 0.5 02/04/2011 1059   EOSABS 0.0 05/12/2011 0847   EOSABS 0.1 02/04/2011 1059   BASOSABS 0.0 05/12/2011 0847   BASOSABS 0.1 02/04/2011 1059      Chemistry      Component Value Date/Time   NA 127* 05/12/2011 0847   NA 136 10/30/2010 0801   K 4.9 05/12/2011 0847   K 4.0 10/30/2010 0801   CL 93* 05/12/2011 0847   CL 101 10/30/2010 0801   CO2 22 05/12/2011  0847   CO2 27 10/30/2010 0801   BUN 12 05/12/2011 0847   BUN 9 10/30/2010 0801   CREATININE 0.90 05/12/2011 0847   CREATININE 0.83 02/10/2011 1037      Component Value Date/Time   CALCIUM 10.2 05/12/2011 0847   CALCIUM 8.9 10/30/2010 0801   ALKPHOS 160* 05/12/2011 0847   ALKPHOS 89* 10/30/2010 0801   AST 22 05/12/2011 0847   AST 17 10/30/2010 0801   ALT 28 05/12/2011 0847   BILITOT 1.1 05/12/2011 0847   BILITOT 1.00 10/30/2010 0801     Lab Results  Component Value Date   INR 3.36* 05/12/2011   INR 3.07* 05/09/2011   INR 0.92 07/16/2010     Assessment: 1. Fatigue 2. Hyponatremia 3. Hypochloridemia   Plan: 1. Hold Coumadin tonight 2. Coumadin 6 mg daily starting tomorrow evening 3. Lab work today: CBC diff, CMET, PT/INR 4. I personally reviewed and went over laboratory results with the patient. 5. 1 L NS today 6. Return Friday for PT/INR and follow-up appointment as scheduled.  7. Encouraged the patient to drink 8-10 glasses of H2O daily.  8.  Will build fluids into his treatment plan 1 week after chemotherapy administration.  All questions were answered. The patient knows to call the clinic with any questions or concerns.   Ciaran Begay

## 2011-05-14 ENCOUNTER — Encounter (HOSPITAL_COMMUNITY): Payer: Medicaid Other

## 2011-05-14 ENCOUNTER — Other Ambulatory Visit (HOSPITAL_COMMUNITY): Payer: Self-pay | Admitting: Oncology

## 2011-05-14 DIAGNOSIS — C189 Malignant neoplasm of colon, unspecified: Secondary | ICD-10-CM

## 2011-05-14 DIAGNOSIS — I82409 Acute embolism and thrombosis of unspecified deep veins of unspecified lower extremity: Secondary | ICD-10-CM

## 2011-05-14 DIAGNOSIS — E860 Dehydration: Secondary | ICD-10-CM

## 2011-05-15 ENCOUNTER — Encounter (HOSPITAL_BASED_OUTPATIENT_CLINIC_OR_DEPARTMENT_OTHER): Payer: Medicaid Other

## 2011-05-15 ENCOUNTER — Encounter (HOSPITAL_BASED_OUTPATIENT_CLINIC_OR_DEPARTMENT_OTHER): Payer: Medicaid Other | Admitting: Oncology

## 2011-05-15 DIAGNOSIS — C189 Malignant neoplasm of colon, unspecified: Secondary | ICD-10-CM

## 2011-05-15 DIAGNOSIS — R634 Abnormal weight loss: Secondary | ICD-10-CM

## 2011-05-15 DIAGNOSIS — R5383 Other fatigue: Secondary | ICD-10-CM

## 2011-05-15 DIAGNOSIS — Z5111 Encounter for antineoplastic chemotherapy: Secondary | ICD-10-CM

## 2011-05-15 DIAGNOSIS — C20 Malignant neoplasm of rectum: Secondary | ICD-10-CM

## 2011-05-15 DIAGNOSIS — R197 Diarrhea, unspecified: Secondary | ICD-10-CM

## 2011-05-15 DIAGNOSIS — F329 Major depressive disorder, single episode, unspecified: Secondary | ICD-10-CM

## 2011-05-15 DIAGNOSIS — F32A Depression, unspecified: Secondary | ICD-10-CM

## 2011-05-15 DIAGNOSIS — R5381 Other malaise: Secondary | ICD-10-CM

## 2011-05-15 DIAGNOSIS — I82409 Acute embolism and thrombosis of unspecified deep veins of unspecified lower extremity: Secondary | ICD-10-CM

## 2011-05-15 DIAGNOSIS — E860 Dehydration: Secondary | ICD-10-CM

## 2011-05-15 LAB — CBC
HCT: 36.6 % — ABNORMAL LOW (ref 39.0–52.0)
Hemoglobin: 12.2 g/dL — ABNORMAL LOW (ref 13.0–17.0)
MCH: 25.3 pg — ABNORMAL LOW (ref 26.0–34.0)
MCHC: 33.3 g/dL (ref 30.0–36.0)
MCV: 75.9 fL — ABNORMAL LOW (ref 78.0–100.0)
Platelets: 422 10*3/uL — ABNORMAL HIGH (ref 150–400)
RBC: 4.82 MIL/uL (ref 4.22–5.81)
RDW: 17.4 % — ABNORMAL HIGH (ref 11.5–15.5)
WBC: 8.7 10*3/uL (ref 4.0–10.5)

## 2011-05-15 LAB — DIFFERENTIAL
Basophils Absolute: 0 10*3/uL (ref 0.0–0.1)
Basophils Relative: 0 % (ref 0–1)
Eosinophils Absolute: 0 10*3/uL (ref 0.0–0.7)
Eosinophils Relative: 0 % (ref 0–5)
Lymphocytes Relative: 10 % — ABNORMAL LOW (ref 12–46)
Lymphs Abs: 0.8 10*3/uL (ref 0.7–4.0)
Monocytes Absolute: 1 10*3/uL (ref 0.1–1.0)
Monocytes Relative: 12 % (ref 3–12)
Neutro Abs: 6.8 10*3/uL (ref 1.7–7.7)
Neutrophils Relative %: 78 % — ABNORMAL HIGH (ref 43–77)

## 2011-05-15 LAB — BASIC METABOLIC PANEL
BUN: 18 mg/dL (ref 6–23)
CO2: 18 mEq/L — ABNORMAL LOW (ref 19–32)
Calcium: 10.4 mg/dL (ref 8.4–10.5)
Chloride: 91 mEq/L — ABNORMAL LOW (ref 96–112)
Creatinine, Ser: 1.17 mg/dL (ref 0.50–1.35)
GFR calc Af Amer: 77 mL/min — ABNORMAL LOW (ref >90)
GFR calc non Af Amer: 66 mL/min — ABNORMAL LOW (ref >90)
Glucose, Bld: 148 mg/dL — ABNORMAL HIGH (ref 70–99)
Potassium: 4.5 mEq/L (ref 3.5–5.1)
Sodium: 123 mEq/L — ABNORMAL LOW (ref 135–145)

## 2011-05-15 LAB — PROTIME-INR
INR: 4.67 — ABNORMAL HIGH (ref 0.00–1.49)
Prothrombin Time: 44.7 seconds — ABNORMAL HIGH (ref 11.6–15.2)

## 2011-05-15 MED ORDER — SODIUM CHLORIDE 0.9 % IJ SOLN
10.0000 mL | INTRAMUSCULAR | Status: DC | PRN
  Administered 2011-05-15: 10 mL via INTRAVENOUS
  Filled 2011-05-15: qty 10

## 2011-05-15 MED ORDER — DIPHENOXYLATE-ATROPINE 2.5-0.025 MG PO TABS: 1.0000 | ORAL_TABLET | Freq: Three times a day (TID) | ORAL | Status: DC | PRN

## 2011-05-15 MED ORDER — HEPARIN SOD (PORK) LOCK FLUSH 100 UNIT/ML IV SOLN
500.0000 [IU] | Freq: Once | INTRAVENOUS | Status: AC
  Administered 2011-05-15: 500 [IU] via INTRAVENOUS
  Filled 2011-05-15: qty 5

## 2011-05-15 MED ORDER — CITALOPRAM HYDROBROMIDE 20 MG PO TABS: 40.0000 mg | ORAL_TABLET | Freq: Every day | ORAL | Status: DC

## 2011-05-15 MED ORDER — SODIUM CHLORIDE 0.9 % IJ SOLN
INTRAMUSCULAR | Status: AC
  Filled 2011-05-15: qty 10

## 2011-05-15 MED ORDER — SODIUM CHLORIDE 0.9 % IV SOLN: INTRAVENOUS | Status: DC

## 2011-05-15 MED ORDER — MEGESTROL ACETATE 625 MG/5ML PO SUSP: 625.0000 mg | Freq: Every day | ORAL | Status: AC

## 2011-05-15 MED ORDER — HEPARIN SOD (PORK) LOCK FLUSH 100 UNIT/ML IV SOLN
INTRAVENOUS | Status: AC
  Filled 2011-05-15: qty 5

## 2011-05-15 MED ORDER — SODIUM CHLORIDE 0.9 % IV SOLN
INTRAVENOUS | Status: DC
  Administered 2011-05-15 (×2): 1000 mL via INTRAVENOUS

## 2011-05-15 NOTE — Progress Notes (Signed)
Tolerated infusion well.  Home accompanied by spouse.

## 2011-05-15 NOTE — Progress Notes (Signed)
Charles [name]., MD, MD [address] [place] [address]  1. Diarrhea  sodium chloride 0.9 % injection, diphenoxylate-atropine (LOMOTIL) 2.5-0.025 MG per tablet  2. Weight loss, abnormal  megestrol (MEGACE ES) 625 MG/5ML suspension  3. Depression  citalopram (CELEXA) 20 MG tablet  4. DVT (deep venous thrombosis)  Protime-INR  5. Colon cancer      CURRENT THERAPY:S/P cycle 7 of FOLFOX starting on 02/05/11. Cycle 8 was deferred for 1 week do to diagnosis of DVT.  S/P proctectomy with colorectal anastomosis and diverting loop ileostomy on 11/14/10 at [facility] by Dr. Rae Halsted.    INTERVAL HISTORY: CACE OSORTO 61 y.o. male returns for  regular  visit for followup of  Stage III colorectal cancer.   The patient's wife called yesterday afternoon and told me that Eyad was not feeling well.  He was fatigued and was having numerous loose stools.  As a result, we set him up for fluids today and lab work.  The patient felt poor today.  He reports fatigue and overall decreased energy level.  He is drinking fluids, but his appetite is poor and he has documented weight loss with a 5 kg weight loss since 04/02/2011.  In light of this, we will initiate Megace ES for appetite stimulation.  I have also suggested Ensure or Boost meal supplementation.   The patient explains that he has continuous loose stools in his ileostomy bag.  He reported an increase in frequency since he got fluids on Monday 05/12/2011.  He does not think he is able to keep up with his output.  He requests a refill on his Lomotil.  This was phoned in to his pharmacy.  He will also utilize Metamucil.  The patient also notes some increased depression.  He is taking 20 mg of Celexa daily.  He does not increased mood swings and episodes of crying.  As a result, we will increase his Celexa to 40 mg daily.    We had a discussion about his lab work.  I personally reviewed and went over laboratory results with the patient.  His INR is  supra therapeutic and we will therefore hold Coumadin x 2 days and then restart on Saturday with 5 mg alternating with 6 mg.  We will also decrease the patient's Oxaliplatin by 15%.  He is due for chemotherapy on Monday and we will continue as scheduled.    Past Medical History  Diagnosis Date  . Colon cancer     rectal ca  . HTN (hypertension)   . Pulmonary embolism   . GERD (gastroesophageal reflux disease)   . Depression 04/01/2011  . DVT (deep venous thrombosis) 05/09/2011    has GERD; DIARRHEA; Colon cancer; Weight loss, abnormal; Dehydration; Depression; and DVT (deep venous thrombosis) on his problem list.      has no known allergies.  Mr. Rafferty had no medications administered during this visit.  Past Surgical History  Procedure Date  . Esophagogastroduodenoscopy 07/2010    schatki ring s/p dilation, small hh, SB bx benign  . Colonoscopy 07/2010    proximal rectal apple core mass 10-14cm from anal verge (adenocarcinoma), 2-3cm distal rectal carpet polyp s/p piecemeal snare polypectomy (adenoma)  . Eus 08/2010    Dr. Rob Bunting. uT3N0 circumferential, nearly obstruction rectosigmoid adenocarcinoma, distal edge 12cm from anal verge  . Ivc filter   . Colon surgery 11/14/2010    proctectomy with colorectal anastomosis and diverting loop ileostomy (temporary planned)  . Port a cath  placement     Denies any headaches, dizziness, double vision, fevers, chills, night sweats, nausea, vomiting, constipation, chest pain, heart palpitations, shortness of breath, blood in stool, black tarry stool, urinary pain, urinary burning, urinary frequency, hematuria.   PHYSICAL EXAMINATION  ECOG PERFORMANCE STATUS: 2 - Symptomatic, <50% confined to bed  There were no vitals filed for this visit.  GENERAL:alert, no distress, well nourished, well developed, comfortable, cooperative and smiling SKIN: skin color, texture, turgor are normal, no rashes or significant lesions HEAD:  Normocephalic, No masses, lesions, tenderness or abnormalities EYES: normal EARS: External ears normal OROPHARYNX:mucous membranes are moist  NECK: supple, trachea midline LYMPH:  not examined BREAST:not examined LUNGS: not examined HEART: not examined ABDOMEN:not examined BACK: not examined EXTREMITIES:not examined  NEURO: alert & oriented x 3 with fluent speech, no focal motor/sensory deficits, gait normal    LABORATORY DATA: CBC    Component Value Date/Time   WBC 8.7 05/15/2011 1017   WBC 6.5 02/04/2011 1059   RBC 4.82 05/15/2011 1017   RBC 4.57 02/04/2011 1059   HGB 12.2* 05/15/2011 1017   HGB 11.6* 02/04/2011 1059   HCT 36.6* 05/15/2011 1017   HCT 34.7* 02/04/2011 1059   PLT 422* 05/15/2011 1017   PLT 282 02/04/2011 1059   MCV 75.9* 05/15/2011 1017   MCV 76.0* 02/04/2011 1059   MCH 25.3* 05/15/2011 1017   MCH 25.4* 02/04/2011 1059   MCHC 33.3 05/15/2011 1017   MCHC 33.5 02/04/2011 1059   RDW 17.4* 05/15/2011 1017   RDW 18.5* 02/04/2011 1059   LYMPHSABS 0.8 05/15/2011 1017   LYMPHSABS 1.1 02/04/2011 1059   MONOABS 1.0 05/15/2011 1017   MONOABS 0.5 02/04/2011 1059   EOSABS 0.0 05/15/2011 1017   EOSABS 0.1 02/04/2011 1059   BASOSABS 0.0 05/15/2011 1017   BASOSABS 0.1 02/04/2011 1059      Chemistry      Component Value Date/Time   NA 123* 05/15/2011 1017   NA 136 10/30/2010 0801   K 4.5 05/15/2011 1017   K 4.0 10/30/2010 0801   CL 91* 05/15/2011 1017   CL 101 10/30/2010 0801   CO2 18* 05/15/2011 1017   CO2 27 10/30/2010 0801   BUN 18 05/15/2011 1017   BUN 9 10/30/2010 0801   CREATININE 1.17 05/15/2011 1017   CREATININE 0.83 02/10/2011 1037      Component Value Date/Time   CALCIUM 10.4 05/15/2011 1017   CALCIUM 8.9 10/30/2010 0801   ALKPHOS 160* 05/12/2011 0847   ALKPHOS 89* 10/30/2010 0801   AST 22 05/12/2011 0847   AST 17 10/30/2010 0801   ALT 28 05/12/2011 0847   BILITOT 1.1 05/12/2011 0847   BILITOT 1.00 10/30/2010 0801     Lab Results  Component Value Date   INR 4.67* 05/15/2011   INR  3.36* 05/12/2011   INR 3.07* 05/09/2011     ASSESSMENT:  1. Stage III Rectal cancer, S/P radiation and chemotherapy, then surgical resection, now on FOLFOX chemotherapy.  2. S/P IVC filter placement  3. On Coumadin anticoagulation for PE in the past.  4. Chronic diarrhea, followed by gastroenterology.  5. Depression, on citalopram with good results  6. DVT dx on May 06 2011 while on Coumadin, but subtherapeutic 1 week prior to Hospital visit at 1.2 according to the patient.  7. Poor appetite  8. Dizziness  8. Fatigue   PLAN:  1. Fluids today 1 1/2- 2 Liter NS 2. Lab work today: CBC diff, BMET, PT/INR 3. Hold Coumadin x 2  days. 4. Change Coumadin dosing to 5 mg alternating with 6 mg.  Starting with 5 mg on Saturday 5. PT/INR on Monday when he is here for chemotherapy. 6. Return on Monday for chemotherapy as anticipated. 7. Rx for Lomotil #100 called in to pharmacy 8. Rx for Megace ES called in to pharmacy 9. Rx for Citalopram 20 mg take 2 tablets daily phoned in to pharmacy. 10. Decrease Oxaliplatin by 15%.  Same dose of 5 FU in light of his solid and stable blood counts.  11. Return to the clinic in one month for follow-up.    All questions were answered. The patient knows to call the clinic with any problems, questions or concerns. We can certainly see the patient much sooner if necessary.   Jaileen Janelle

## 2011-05-16 ENCOUNTER — Ambulatory Visit (HOSPITAL_COMMUNITY): Payer: Medicaid Other | Admitting: Oncology

## 2011-05-16 ENCOUNTER — Other Ambulatory Visit (HOSPITAL_COMMUNITY): Payer: Medicaid Other

## 2011-05-19 ENCOUNTER — Other Ambulatory Visit (HOSPITAL_COMMUNITY): Payer: Self-pay | Admitting: Oncology

## 2011-05-19 ENCOUNTER — Encounter (HOSPITAL_BASED_OUTPATIENT_CLINIC_OR_DEPARTMENT_OTHER): Payer: Medicaid Other

## 2011-05-19 DIAGNOSIS — Z5111 Encounter for antineoplastic chemotherapy: Secondary | ICD-10-CM

## 2011-05-19 DIAGNOSIS — I82409 Acute embolism and thrombosis of unspecified deep veins of unspecified lower extremity: Secondary | ICD-10-CM

## 2011-05-19 DIAGNOSIS — C189 Malignant neoplasm of colon, unspecified: Secondary | ICD-10-CM

## 2011-05-19 DIAGNOSIS — C20 Malignant neoplasm of rectum: Secondary | ICD-10-CM

## 2011-05-19 LAB — BASIC METABOLIC PANEL
BUN: 18 mg/dL (ref 6–23)
CO2: 17 mEq/L — ABNORMAL LOW (ref 19–32)
Calcium: 10.4 mg/dL (ref 8.4–10.5)
Chloride: 93 mEq/L — ABNORMAL LOW (ref 96–112)
Creatinine, Ser: 1.08 mg/dL (ref 0.50–1.35)
GFR calc Af Amer: 84 mL/min — ABNORMAL LOW (ref >90)
GFR calc non Af Amer: 73 mL/min — ABNORMAL LOW (ref >90)
Glucose, Bld: 166 mg/dL — ABNORMAL HIGH (ref 70–99)
Potassium: 4.2 mEq/L (ref 3.5–5.1)
Sodium: 124 mEq/L — ABNORMAL LOW (ref 135–145)

## 2011-05-19 LAB — CBC
HCT: 37.7 % — ABNORMAL LOW (ref 39.0–52.0)
Hemoglobin: 12.8 g/dL — ABNORMAL LOW (ref 13.0–17.0)
MCH: 26.2 pg (ref 26.0–34.0)
MCHC: 34 g/dL (ref 30.0–36.0)
MCV: 77.1 fL — ABNORMAL LOW (ref 78.0–100.0)
Platelets: 540 10*3/uL — ABNORMAL HIGH (ref 150–400)
RBC: 4.89 MIL/uL (ref 4.22–5.81)
RDW: 17.3 % — ABNORMAL HIGH (ref 11.5–15.5)
WBC: 6.8 10*3/uL (ref 4.0–10.5)

## 2011-05-19 LAB — DIFFERENTIAL
Basophils Absolute: 0.1 10*3/uL (ref 0.0–0.1)
Basophils Relative: 1 % (ref 0–1)
Eosinophils Absolute: 0.1 10*3/uL (ref 0.0–0.7)
Eosinophils Relative: 1 % (ref 0–5)
Lymphocytes Relative: 18 % (ref 12–46)
Lymphs Abs: 1.2 10*3/uL (ref 0.7–4.0)
Monocytes Absolute: 0.9 10*3/uL (ref 0.1–1.0)
Monocytes Relative: 13 % — ABNORMAL HIGH (ref 3–12)
Neutro Abs: 4.5 10*3/uL (ref 1.7–7.7)
Neutrophils Relative %: 67 % (ref 43–77)

## 2011-05-19 LAB — PROTIME-INR
INR: 2.01 — ABNORMAL HIGH (ref 0.00–1.49)
Prothrombin Time: 23.1 seconds — ABNORMAL HIGH (ref 11.6–15.2)

## 2011-05-19 MED ORDER — DEXTROSE 5 % IV BOLUS
50.0000 mL | INTRAVENOUS | Status: AC
  Filled 2011-05-19 (×2): qty 50

## 2011-05-19 MED ORDER — SODIUM CHLORIDE 0.9 % IJ SOLN
INTRAMUSCULAR | Status: AC
  Filled 2011-05-19: qty 10

## 2011-05-19 MED ORDER — OXALIPLATIN CHEMO INJECTION 100 MG/20ML
119.0000 mg | Freq: Once | INTRAVENOUS | Status: AC
  Administered 2011-05-19: 120 mg via INTRAVENOUS
  Filled 2011-05-19: qty 24

## 2011-05-19 MED ORDER — DEXAMETHASONE SODIUM PHOSPHATE 10 MG/ML IJ SOLN: 10.0000 mg | Freq: Once | INTRAMUSCULAR | Status: DC

## 2011-05-19 MED ORDER — LEUCOVORIN CALCIUM INJECTION 350 MG: 660.0000 mg | Freq: Once | INTRAVENOUS | Status: DC

## 2011-05-19 MED ORDER — LEUCOVORIN CALCIUM INJECTION 100 MG
42.0000 mg | Freq: Once | INTRAMUSCULAR | Status: AC
  Administered 2011-05-19: 42 mg via INTRAVENOUS
  Filled 2011-05-19: qty 2.1

## 2011-05-19 MED ORDER — SODIUM CHLORIDE 0.9 % IV SOLN
3850.0000 mg | INTRAVENOUS | Status: DC
  Administered 2011-05-19: 3850 mg via INTRAVENOUS
  Filled 2011-05-19 (×2): qty 77

## 2011-05-19 MED ORDER — SODIUM CHLORIDE 0.9 % IV SOLN: 8.0000 mg | Freq: Once | INTRAVENOUS | Status: DC

## 2011-05-19 MED ORDER — SODIUM CHLORIDE 0.9 % IV SOLN
INTRAVENOUS | Status: DC
  Administered 2011-05-19: 1500 mL via INTRAVENOUS

## 2011-05-19 MED ORDER — FLUOROURACIL CHEMO INJECTION 2.5 GM/50ML
700.0000 mg | Freq: Once | INTRAVENOUS | Status: AC
  Administered 2011-05-19: 700 mg via INTRAVENOUS
  Filled 2011-05-19: qty 14

## 2011-05-19 MED ORDER — SODIUM CHLORIDE 0.9 % IJ SOLN
10.0000 mL | INTRAMUSCULAR | Status: DC | PRN
  Administered 2011-05-19: 10 mL
  Filled 2011-05-19: qty 10

## 2011-05-19 MED ORDER — DEXTROSE 5 % IV SOLN: Freq: Once | INTRAVENOUS | Status: DC

## 2011-05-19 MED ORDER — SODIUM CHLORIDE 0.9 % IV SOLN
Freq: Once | INTRAVENOUS | Status: AC
  Administered 2011-05-19: 8 mg via INTRAVENOUS
  Filled 2011-05-19: qty 4

## 2011-05-19 NOTE — Progress Notes (Signed)
Tolerated chemo and fluids well. 

## 2011-05-20 ENCOUNTER — Other Ambulatory Visit: Payer: Self-pay | Admitting: Certified Registered Nurse Anesthetist

## 2011-05-21 ENCOUNTER — Encounter (HOSPITAL_COMMUNITY): Payer: Medicaid Other

## 2011-05-21 ENCOUNTER — Encounter (HOSPITAL_COMMUNITY): Payer: Self-pay | Admitting: Oncology

## 2011-05-21 ENCOUNTER — Encounter (HOSPITAL_BASED_OUTPATIENT_CLINIC_OR_DEPARTMENT_OTHER): Payer: Medicaid Other | Admitting: Oncology

## 2011-05-21 VITALS — BP 86/64 | HR 114 | Temp 97.3°F

## 2011-05-21 DIAGNOSIS — Z932 Ileostomy status: Secondary | ICD-10-CM

## 2011-05-21 DIAGNOSIS — M6281 Muscle weakness (generalized): Secondary | ICD-10-CM

## 2011-05-21 DIAGNOSIS — C189 Malignant neoplasm of colon, unspecified: Secondary | ICD-10-CM

## 2011-05-21 DIAGNOSIS — R5381 Other malaise: Secondary | ICD-10-CM

## 2011-05-21 DIAGNOSIS — J3489 Other specified disorders of nose and nasal sinuses: Secondary | ICD-10-CM

## 2011-05-21 DIAGNOSIS — R198 Other specified symptoms and signs involving the digestive system and abdomen: Secondary | ICD-10-CM

## 2011-05-21 HISTORY — DX: Other specified symptoms and signs involving the digestive system and abdomen: R19.8

## 2011-05-21 MED ORDER — AMOXICILLIN-POT CLAVULANATE 875-125 MG PO TABS: 1.0000 | ORAL_TABLET | Freq: Two times a day (BID) | ORAL | Status: AC

## 2011-05-21 MED ORDER — HEPARIN SOD (PORK) LOCK FLUSH 100 UNIT/ML IV SOLN
INTRAVENOUS | Status: AC
  Administered 2011-05-21: 500 [IU]
  Filled 2011-05-21: qty 5

## 2011-05-21 MED ORDER — SODIUM CHLORIDE 0.9 % IJ SOLN
INTRAMUSCULAR | Status: AC
  Administered 2011-05-21: 10 mL
  Filled 2011-05-21: qty 10

## 2011-05-21 MED ORDER — HEPARIN SOD (PORK) LOCK FLUSH 100 UNIT/ML IV SOLN
500.0000 [IU] | Freq: Once | INTRAVENOUS | Status: AC | PRN
  Administered 2011-05-21: 500 [IU]
  Filled 2011-05-21: qty 5

## 2011-05-21 MED ORDER — SODIUM CHLORIDE 0.9 % IJ SOLN
10.0000 mL | INTRAMUSCULAR | Status: DC | PRN
  Administered 2011-05-21: 10 mL
  Filled 2011-05-21: qty 10

## 2011-05-21 NOTE — Progress Notes (Signed)
Charles [name] presented for Portacath and infusional pump removal and flush. Proper placement of portacath confirmed by CXR. Portacath located rt chest wall accessed with  H 20 needle.  Portacath flushed with 20ml NS and capped. Awaiting Dr. To see pt re: weakness, sore mouth. Procedure without incident. Patient tolerated procedure well.

## 2011-05-21 NOTE — Progress Notes (Signed)
Subjective: The patient is seen today as a walk-in.  He reports continued weakness.  He explains that his wife is helping him with ADLs which is abnormal for him.  The patient reports that he feels better following fluid administration, but it is short-lived.  It only last 1/2 a day or so.  He explains that he was receiving 3 days worth of fluids in the past in [place].    Objective: General:  Patient seen lying on an examination table.  NAD.  Weight loss noted. HEENT: Atraumatic Neck: Trachea midline Oropharynx:  No erythema noted Neuro: A and O x 3.  No focal deficits noted.   Assessment:  1. Fatigue 2. Weakness 3. Weight loss  Plan: 1. Augmentin 875/125 mg take one tablet PO BID #16 with no refills.  2. Return for 2 L NS tomorrow.  3. 2 L NS three times per week (M/W/F). 4. Supportive therapy plan built 5. Return for lab work and return appointment as scheduled.   All questions were answered.  Patient knows to call the clinic with any questions or concerns.   Charles [name]

## 2011-05-21 NOTE — Progress Notes (Signed)
Port flushed per protocol after 67fu pump discontinued. Pt to return to clinic tomorrow and Friday for IV hydration.

## 2011-05-22 ENCOUNTER — Encounter (HOSPITAL_BASED_OUTPATIENT_CLINIC_OR_DEPARTMENT_OTHER): Payer: Medicaid Other

## 2011-05-22 DIAGNOSIS — R198 Other specified symptoms and signs involving the digestive system and abdomen: Secondary | ICD-10-CM

## 2011-05-22 DIAGNOSIS — R197 Diarrhea, unspecified: Secondary | ICD-10-CM

## 2011-05-22 DIAGNOSIS — E860 Dehydration: Secondary | ICD-10-CM

## 2011-05-22 DIAGNOSIS — Z5111 Encounter for antineoplastic chemotherapy: Secondary | ICD-10-CM

## 2011-05-22 MED ORDER — SODIUM CHLORIDE 0.9 % IV SOLN
INTRAVENOUS | Status: DC
  Administered 2011-05-22: 10:00:00 via INTRAVENOUS

## 2011-05-22 MED ORDER — HEPARIN SOD (PORK) LOCK FLUSH 100 UNIT/ML IV SOLN
INTRAVENOUS | Status: AC
  Filled 2011-05-22: qty 5

## 2011-05-22 MED ORDER — SODIUM CHLORIDE 0.9 % IJ SOLN
INTRAMUSCULAR | Status: AC
  Administered 2011-05-22: 10 mL
  Filled 2011-05-22: qty 10

## 2011-05-23 ENCOUNTER — Encounter (HOSPITAL_COMMUNITY): Payer: Medicaid Other

## 2011-05-23 ENCOUNTER — Encounter (HOSPITAL_BASED_OUTPATIENT_CLINIC_OR_DEPARTMENT_OTHER): Payer: Medicaid Other

## 2011-05-23 ENCOUNTER — Other Ambulatory Visit (HOSPITAL_COMMUNITY): Payer: Self-pay | Admitting: Oncology

## 2011-05-23 DIAGNOSIS — R197 Diarrhea, unspecified: Secondary | ICD-10-CM

## 2011-05-23 DIAGNOSIS — C20 Malignant neoplasm of rectum: Secondary | ICD-10-CM

## 2011-05-23 DIAGNOSIS — E860 Dehydration: Secondary | ICD-10-CM

## 2011-05-23 DIAGNOSIS — I82409 Acute embolism and thrombosis of unspecified deep veins of unspecified lower extremity: Secondary | ICD-10-CM

## 2011-05-23 DIAGNOSIS — Z5111 Encounter for antineoplastic chemotherapy: Secondary | ICD-10-CM

## 2011-05-23 DIAGNOSIS — R198 Other specified symptoms and signs involving the digestive system and abdomen: Secondary | ICD-10-CM

## 2011-05-23 LAB — PROTIME-INR
INR: 3.16 — ABNORMAL HIGH (ref 0.00–1.49)
Prothrombin Time: 32.9 seconds — ABNORMAL HIGH (ref 11.6–15.2)

## 2011-05-23 MED ORDER — SODIUM CHLORIDE 0.9 % IJ SOLN
INTRAMUSCULAR | Status: AC
  Filled 2011-05-23: qty 10

## 2011-05-23 MED ORDER — SODIUM CHLORIDE 0.9 % IV SOLN
INTRAVENOUS | Status: DC
  Administered 2011-05-23: 1000 mL via INTRAVENOUS

## 2011-05-23 MED ORDER — HEPARIN SOD (PORK) LOCK FLUSH 100 UNIT/ML IV SOLN
INTRAVENOUS | Status: AC
  Administered 2011-05-23: 500 [IU]
  Filled 2011-05-23: qty 5

## 2011-05-23 NOTE — Progress Notes (Signed)
Pt reports feeling better each day.

## 2011-05-26 ENCOUNTER — Encounter (HOSPITAL_BASED_OUTPATIENT_CLINIC_OR_DEPARTMENT_OTHER): Payer: Medicaid Other

## 2011-05-26 ENCOUNTER — Other Ambulatory Visit (HOSPITAL_COMMUNITY): Payer: Self-pay | Admitting: Oncology

## 2011-05-26 ENCOUNTER — Encounter (HOSPITAL_COMMUNITY): Payer: Medicaid Other

## 2011-05-26 DIAGNOSIS — C20 Malignant neoplasm of rectum: Secondary | ICD-10-CM

## 2011-05-26 DIAGNOSIS — R5383 Other fatigue: Secondary | ICD-10-CM

## 2011-05-26 DIAGNOSIS — E860 Dehydration: Secondary | ICD-10-CM

## 2011-05-26 DIAGNOSIS — Z5111 Encounter for antineoplastic chemotherapy: Secondary | ICD-10-CM

## 2011-05-26 DIAGNOSIS — R197 Diarrhea, unspecified: Secondary | ICD-10-CM

## 2011-05-26 DIAGNOSIS — I82409 Acute embolism and thrombosis of unspecified deep veins of unspecified lower extremity: Secondary | ICD-10-CM

## 2011-05-26 DIAGNOSIS — R198 Other specified symptoms and signs involving the digestive system and abdomen: Secondary | ICD-10-CM

## 2011-05-26 DIAGNOSIS — Z932 Ileostomy status: Secondary | ICD-10-CM

## 2011-05-26 DIAGNOSIS — R5381 Other malaise: Secondary | ICD-10-CM

## 2011-05-26 LAB — PROTIME-INR
INR: 2.65 — ABNORMAL HIGH (ref 0.00–1.49)
Prothrombin Time: 28.7 seconds — ABNORMAL HIGH (ref 11.6–15.2)

## 2011-05-26 MED ORDER — SODIUM CHLORIDE 0.9 % IV SOLN
INTRAVENOUS | Status: DC
  Administered 2011-05-26: 10:00:00 via INTRAVENOUS

## 2011-05-26 MED ORDER — HEPARIN SOD (PORK) LOCK FLUSH 100 UNIT/ML IV SOLN
INTRAVENOUS | Status: AC
  Administered 2011-05-26: 500 [IU] via INTRAVENOUS
  Filled 2011-05-26: qty 5

## 2011-05-26 MED ORDER — HEPARIN SOD (PORK) LOCK FLUSH 100 UNIT/ML IV SOLN
500.0000 [IU] | Freq: Once | INTRAVENOUS | Status: AC
  Administered 2011-05-26: 500 [IU] via INTRAVENOUS
  Filled 2011-05-26: qty 5

## 2011-05-27 ENCOUNTER — Other Ambulatory Visit (HOSPITAL_COMMUNITY): Payer: Medicaid Other

## 2011-05-28 ENCOUNTER — Other Ambulatory Visit: Payer: Self-pay | Admitting: Certified Registered Nurse Anesthetist

## 2011-05-28 ENCOUNTER — Encounter (HOSPITAL_BASED_OUTPATIENT_CLINIC_OR_DEPARTMENT_OTHER): Payer: Medicaid Other

## 2011-05-28 DIAGNOSIS — C20 Malignant neoplasm of rectum: Secondary | ICD-10-CM

## 2011-05-28 DIAGNOSIS — Z5111 Encounter for antineoplastic chemotherapy: Secondary | ICD-10-CM

## 2011-05-28 DIAGNOSIS — C189 Malignant neoplasm of colon, unspecified: Secondary | ICD-10-CM

## 2011-05-28 MED ORDER — SODIUM CHLORIDE 0.9 % IV SOLN
Freq: Once | INTRAVENOUS | Status: AC
  Administered 2011-05-28: 11:00:00 via INTRAVENOUS

## 2011-05-28 MED ORDER — HEPARIN SOD (PORK) LOCK FLUSH 100 UNIT/ML IV SOLN
INTRAVENOUS | Status: AC
  Filled 2011-05-28: qty 5

## 2011-05-28 MED ORDER — HEPARIN SOD (PORK) LOCK FLUSH 100 UNIT/ML IV SOLN
500.0000 [IU] | Freq: Once | INTRAVENOUS | Status: AC
  Administered 2011-05-28: 500 [IU] via INTRAVENOUS
  Filled 2011-05-28: qty 5

## 2011-05-30 ENCOUNTER — Encounter (HOSPITAL_BASED_OUTPATIENT_CLINIC_OR_DEPARTMENT_OTHER): Payer: Medicaid Other

## 2011-05-30 ENCOUNTER — Other Ambulatory Visit (HOSPITAL_COMMUNITY): Payer: Self-pay | Admitting: Oncology

## 2011-05-30 ENCOUNTER — Telehealth (HOSPITAL_COMMUNITY): Payer: Self-pay | Admitting: *Deleted

## 2011-05-30 DIAGNOSIS — I82409 Acute embolism and thrombosis of unspecified deep veins of unspecified lower extremity: Secondary | ICD-10-CM

## 2011-05-30 DIAGNOSIS — E860 Dehydration: Secondary | ICD-10-CM

## 2011-05-30 DIAGNOSIS — C20 Malignant neoplasm of rectum: Secondary | ICD-10-CM

## 2011-05-30 DIAGNOSIS — C189 Malignant neoplasm of colon, unspecified: Secondary | ICD-10-CM

## 2011-05-30 DIAGNOSIS — R198 Other specified symptoms and signs involving the digestive system and abdomen: Secondary | ICD-10-CM

## 2011-05-30 DIAGNOSIS — Z5111 Encounter for antineoplastic chemotherapy: Secondary | ICD-10-CM

## 2011-05-30 DIAGNOSIS — R197 Diarrhea, unspecified: Secondary | ICD-10-CM

## 2011-05-30 LAB — CBC
HCT: 30.7 % — ABNORMAL LOW (ref 39.0–52.0)
Hemoglobin: 10.3 g/dL — ABNORMAL LOW (ref 13.0–17.0)
MCH: 26.1 pg (ref 26.0–34.0)
MCHC: 33.6 g/dL (ref 30.0–36.0)
MCV: 77.7 fL — ABNORMAL LOW (ref 78.0–100.0)
Platelets: 241 10*3/uL (ref 150–400)
RBC: 3.95 MIL/uL — ABNORMAL LOW (ref 4.22–5.81)
RDW: 18.3 % — ABNORMAL HIGH (ref 11.5–15.5)
WBC: 6.2 10*3/uL (ref 4.0–10.5)

## 2011-05-30 LAB — COMPREHENSIVE METABOLIC PANEL
ALT: 18 U/L (ref 0–53)
AST: 14 U/L (ref 0–37)
Albumin: 2.9 g/dL — ABNORMAL LOW (ref 3.5–5.2)
Alkaline Phosphatase: 94 U/L (ref 39–117)
BUN: 5 mg/dL — ABNORMAL LOW (ref 6–23)
CO2: 19 mEq/L (ref 19–32)
Calcium: 8.8 mg/dL (ref 8.4–10.5)
Chloride: 105 mEq/L (ref 96–112)
Creatinine, Ser: 0.68 mg/dL (ref 0.50–1.35)
GFR calc Af Amer: >90 mL/min (ref >90)
GFR calc non Af Amer: >90 mL/min (ref >90)
Glucose, Bld: 81 mg/dL (ref 70–99)
Potassium: 3.6 mEq/L (ref 3.5–5.1)
Sodium: 132 mEq/L — ABNORMAL LOW (ref 135–145)
Total Bilirubin: 0.4 mg/dL (ref 0.3–1.2)
Total Protein: 6 g/dL (ref 6.0–8.3)

## 2011-05-30 LAB — DIFFERENTIAL
Basophils Absolute: 0 10*3/uL (ref 0.0–0.1)
Basophils Relative: 1 % (ref 0–1)
Eosinophils Absolute: 0.1 10*3/uL (ref 0.0–0.7)
Eosinophils Relative: 1 % (ref 0–5)
Lymphocytes Relative: 21 % (ref 12–46)
Lymphs Abs: 1.3 10*3/uL (ref 0.7–4.0)
Monocytes Absolute: 0.6 10*3/uL (ref 0.1–1.0)
Monocytes Relative: 10 % (ref 3–12)
Neutro Abs: 4.2 10*3/uL (ref 1.7–7.7)
Neutrophils Relative %: 67 % (ref 43–77)

## 2011-05-30 LAB — PROTIME-INR
INR: 3.53 — ABNORMAL HIGH (ref 0.00–1.49)
Prothrombin Time: 35.9 seconds — ABNORMAL HIGH (ref 11.6–15.2)

## 2011-05-30 MED ORDER — HEPARIN SOD (PORK) LOCK FLUSH 100 UNIT/ML IV SOLN
500.0000 [IU] | Freq: Once | INTRAVENOUS | Status: AC
  Administered 2011-05-30: 500 [IU] via INTRAVENOUS
  Filled 2011-05-30: qty 5

## 2011-05-30 MED ORDER — SODIUM CHLORIDE 0.9 % IV SOLN: INTRAVENOUS | Status: DC

## 2011-05-30 MED ORDER — HEPARIN SOD (PORK) LOCK FLUSH 100 UNIT/ML IV SOLN
INTRAVENOUS | Status: AC
  Filled 2011-05-30: qty 5

## 2011-05-30 MED ORDER — SODIUM CHLORIDE 0.9 % IV SOLN
INTRAVENOUS | Status: DC
  Administered 2011-05-30 (×2): 1000 mL via INTRAVENOUS

## 2011-05-30 NOTE — Telephone Encounter (Signed)
Note given to [facility] to give to [facility] regarding instructions on Coumadin and INR on Monday 1/28.

## 2011-05-30 NOTE — Progress Notes (Signed)
Tolerated iv fluids well. D/C home accompanied by spouse.

## 2011-06-02 ENCOUNTER — Other Ambulatory Visit (HOSPITAL_COMMUNITY): Payer: Self-pay | Admitting: Oncology

## 2011-06-02 ENCOUNTER — Telehealth (HOSPITAL_COMMUNITY): Payer: Self-pay | Admitting: *Deleted

## 2011-06-02 ENCOUNTER — Encounter (HOSPITAL_BASED_OUTPATIENT_CLINIC_OR_DEPARTMENT_OTHER): Payer: Medicaid Other

## 2011-06-02 DIAGNOSIS — I82409 Acute embolism and thrombosis of unspecified deep veins of unspecified lower extremity: Secondary | ICD-10-CM

## 2011-06-02 DIAGNOSIS — E860 Dehydration: Secondary | ICD-10-CM

## 2011-06-02 DIAGNOSIS — Z5111 Encounter for antineoplastic chemotherapy: Secondary | ICD-10-CM

## 2011-06-02 DIAGNOSIS — C189 Malignant neoplasm of colon, unspecified: Secondary | ICD-10-CM

## 2011-06-02 DIAGNOSIS — C20 Malignant neoplasm of rectum: Secondary | ICD-10-CM

## 2011-06-02 LAB — PROTIME-INR
INR: 2.83 — ABNORMAL HIGH (ref 0.00–1.49)
Prothrombin Time: 30.2 seconds — ABNORMAL HIGH (ref 11.6–15.2)

## 2011-06-02 MED ORDER — LEUCOVORIN CALCIUM INJECTION 350 MG: 660.0000 mg | Freq: Once | INTRAVENOUS | Status: DC

## 2011-06-02 MED ORDER — SODIUM CHLORIDE 0.9 % IV SOLN
INTRAVENOUS | Status: DC
  Administered 2011-06-02: 2000 mL via INTRAVENOUS

## 2011-06-02 MED ORDER — SODIUM CHLORIDE 0.9 % IV SOLN
Freq: Once | INTRAVENOUS | Status: AC
  Administered 2011-06-02: 8 mg via INTRAVENOUS
  Filled 2011-06-02: qty 4

## 2011-06-02 MED ORDER — SODIUM CHLORIDE 0.9 % IV SOLN: 8.0000 mg | Freq: Once | INTRAVENOUS | Status: DC

## 2011-06-02 MED ORDER — DEXAMETHASONE SODIUM PHOSPHATE 10 MG/ML IJ SOLN: 10.0000 mg | Freq: Once | INTRAMUSCULAR | Status: DC

## 2011-06-02 MED ORDER — LEUCOVORIN CALCIUM INJECTION 100 MG
20.0000 mg/m2 | Freq: Once | INTRAMUSCULAR | Status: AC
  Administered 2011-06-02: 42 mg via INTRAVENOUS
  Filled 2011-06-02: qty 2.1

## 2011-06-02 MED ORDER — SODIUM CHLORIDE 0.9 % IV SOLN
3850.0000 mg | INTRAVENOUS | Status: DC
  Administered 2011-06-02: 3850 mg via INTRAVENOUS
  Filled 2011-06-02 (×2): qty 77

## 2011-06-02 MED ORDER — DEXTROSE 5 % IV SOLN
50.0000 mL | INTRAVENOUS | Status: AC
  Administered 2011-06-02: 50 mL via INTRAVENOUS
  Filled 2011-06-02 (×2): qty 50

## 2011-06-02 MED ORDER — OXALIPLATIN CHEMO INJECTION 100 MG/20ML
119.0000 mg | Freq: Once | INTRAVENOUS | Status: AC
  Administered 2011-06-02: 120 mg via INTRAVENOUS
  Filled 2011-06-02: qty 24

## 2011-06-02 MED ORDER — SODIUM CHLORIDE 0.9 % IJ SOLN
10.0000 mL | INTRAMUSCULAR | Status: DC | PRN
Start: 2011-06-02 — End: 2011-06-02
  Filled 2011-06-02: qty 10

## 2011-06-02 MED ORDER — FLUOROURACIL CHEMO INJECTION 2.5 GM/50ML
700.0000 mg | Freq: Once | INTRAVENOUS | Status: AC
  Administered 2011-06-02: 700 mg via INTRAVENOUS
  Filled 2011-06-02: qty 14

## 2011-06-02 NOTE — Telephone Encounter (Signed)
Message copied by Dennie Maizes on Mon Jun 02, 2011  4:50 PM ------      Message from: Ellouise Newer III      Created: Mon Jun 02, 2011  4:10 PM       Same dose            PT/INR check again on Wednesday with fluids

## 2011-06-02 NOTE — Telephone Encounter (Signed)
Spoke with wife Brenda,instructions as below. Verbalized understanding.

## 2011-06-02 NOTE — Progress Notes (Signed)
Tolerated chemo well. 

## 2011-06-03 ENCOUNTER — Other Ambulatory Visit: Payer: Self-pay | Admitting: Certified Registered Nurse Anesthetist

## 2011-06-04 ENCOUNTER — Telehealth (HOSPITAL_COMMUNITY): Payer: Self-pay

## 2011-06-04 ENCOUNTER — Other Ambulatory Visit (HOSPITAL_COMMUNITY): Payer: Self-pay | Admitting: Oncology

## 2011-06-04 ENCOUNTER — Encounter (HOSPITAL_BASED_OUTPATIENT_CLINIC_OR_DEPARTMENT_OTHER): Payer: Medicaid Other

## 2011-06-04 VITALS — BP 114/78 | HR 106 | Temp 97.8°F

## 2011-06-04 DIAGNOSIS — Z5111 Encounter for antineoplastic chemotherapy: Secondary | ICD-10-CM

## 2011-06-04 DIAGNOSIS — I82409 Acute embolism and thrombosis of unspecified deep veins of unspecified lower extremity: Secondary | ICD-10-CM

## 2011-06-04 DIAGNOSIS — C189 Malignant neoplasm of colon, unspecified: Secondary | ICD-10-CM

## 2011-06-04 DIAGNOSIS — C20 Malignant neoplasm of rectum: Secondary | ICD-10-CM

## 2011-06-04 LAB — PROTIME-INR
INR: 2.35 — ABNORMAL HIGH (ref 0.00–1.49)
Prothrombin Time: 26.1 seconds — ABNORMAL HIGH (ref 11.6–15.2)

## 2011-06-04 MED ORDER — SODIUM CHLORIDE 0.9 % IV SOLN
INTRAVENOUS | Status: DC
  Administered 2011-06-04: 1000 mL via INTRAVENOUS

## 2011-06-04 MED ORDER — HEPARIN SOD (PORK) LOCK FLUSH 100 UNIT/ML IV SOLN
INTRAVENOUS | Status: AC
  Administered 2011-06-04: 500 [IU]
  Filled 2011-06-04: qty 5

## 2011-06-04 MED ORDER — HEPARIN SOD (PORK) LOCK FLUSH 100 UNIT/ML IV SOLN
500.0000 [IU] | Freq: Once | INTRAVENOUS | Status: AC | PRN
  Administered 2011-06-04: 500 [IU]
  Filled 2011-06-04: qty 5

## 2011-06-04 NOTE — Telephone Encounter (Signed)
msg left with daughter to inform pt to remain on same dosage of coumadin and that we will re-check pt/inr on Friday.

## 2011-06-04 NOTE — Progress Notes (Signed)
Tolerated well

## 2011-06-05 ENCOUNTER — Other Ambulatory Visit: Payer: Self-pay | Admitting: Certified Registered Nurse Anesthetist

## 2011-06-06 ENCOUNTER — Encounter (HOSPITAL_COMMUNITY): Payer: Medicaid Other | Attending: Hematology and Oncology

## 2011-06-06 ENCOUNTER — Other Ambulatory Visit (HOSPITAL_COMMUNITY): Payer: Self-pay | Admitting: Oncology

## 2011-06-06 DIAGNOSIS — Z932 Ileostomy status: Secondary | ICD-10-CM | POA: Insufficient documentation

## 2011-06-06 DIAGNOSIS — C189 Malignant neoplasm of colon, unspecified: Secondary | ICD-10-CM | POA: Insufficient documentation

## 2011-06-06 DIAGNOSIS — E860 Dehydration: Secondary | ICD-10-CM | POA: Insufficient documentation

## 2011-06-06 DIAGNOSIS — I82409 Acute embolism and thrombosis of unspecified deep veins of unspecified lower extremity: Secondary | ICD-10-CM

## 2011-06-06 DIAGNOSIS — R198 Other specified symptoms and signs involving the digestive system and abdomen: Secondary | ICD-10-CM | POA: Insufficient documentation

## 2011-06-06 DIAGNOSIS — Z5111 Encounter for antineoplastic chemotherapy: Secondary | ICD-10-CM

## 2011-06-06 DIAGNOSIS — C20 Malignant neoplasm of rectum: Secondary | ICD-10-CM

## 2011-06-06 LAB — PROTIME-INR
INR: 2.47 — ABNORMAL HIGH (ref 0.00–1.49)
Prothrombin Time: 27.2 seconds — ABNORMAL HIGH (ref 11.6–15.2)

## 2011-06-06 MED ORDER — SODIUM CHLORIDE 0.9 % IV SOLN
INTRAVENOUS | Status: DC
  Administered 2011-06-06: 09:00:00 via INTRAVENOUS

## 2011-06-06 MED ORDER — HEPARIN SOD (PORK) LOCK FLUSH 100 UNIT/ML IV SOLN
INTRAVENOUS | Status: AC
  Filled 2011-06-06: qty 5

## 2011-06-06 MED ORDER — HEPARIN SOD (PORK) LOCK FLUSH 100 UNIT/ML IV SOLN
500.0000 [IU] | Freq: Once | INTRAVENOUS | Status: AC
  Administered 2011-06-06: 500 [IU] via INTRAVENOUS
  Filled 2011-06-06: qty 5

## 2011-06-09 ENCOUNTER — Encounter (HOSPITAL_BASED_OUTPATIENT_CLINIC_OR_DEPARTMENT_OTHER): Payer: Medicaid Other

## 2011-06-09 ENCOUNTER — Telehealth (HOSPITAL_COMMUNITY): Payer: Self-pay | Admitting: *Deleted

## 2011-06-09 ENCOUNTER — Other Ambulatory Visit (HOSPITAL_COMMUNITY): Payer: Self-pay | Admitting: Oncology

## 2011-06-09 DIAGNOSIS — Z5111 Encounter for antineoplastic chemotherapy: Secondary | ICD-10-CM

## 2011-06-09 DIAGNOSIS — I82409 Acute embolism and thrombosis of unspecified deep veins of unspecified lower extremity: Secondary | ICD-10-CM

## 2011-06-09 DIAGNOSIS — R198 Other specified symptoms and signs involving the digestive system and abdomen: Secondary | ICD-10-CM

## 2011-06-09 DIAGNOSIS — C20 Malignant neoplasm of rectum: Secondary | ICD-10-CM

## 2011-06-09 DIAGNOSIS — Z932 Ileostomy status: Secondary | ICD-10-CM

## 2011-06-09 DIAGNOSIS — E860 Dehydration: Secondary | ICD-10-CM

## 2011-06-09 LAB — PROTIME-INR
INR: 1.79 — ABNORMAL HIGH (ref 0.00–1.49)
Prothrombin Time: 21.1 seconds — ABNORMAL HIGH (ref 11.6–15.2)

## 2011-06-09 MED ORDER — HEPARIN SOD (PORK) LOCK FLUSH 100 UNIT/ML IV SOLN
INTRAVENOUS | Status: AC
  Filled 2011-06-09: qty 5

## 2011-06-09 MED ORDER — SODIUM CHLORIDE 0.9 % IV SOLN
INTRAVENOUS | Status: DC
  Administered 2011-06-09 (×2): 1000 mL via INTRAVENOUS

## 2011-06-09 MED ORDER — SODIUM CHLORIDE 0.9 % IJ SOLN
10.0000 mL | INTRAMUSCULAR | Status: DC | PRN
  Administered 2011-06-09: 10 mL via INTRAVENOUS
  Filled 2011-06-09: qty 10

## 2011-06-09 MED ORDER — HEPARIN SOD (PORK) LOCK FLUSH 100 UNIT/ML IV SOLN
500.0000 [IU] | Freq: Once | INTRAVENOUS | Status: AC
  Administered 2011-06-09: 500 [IU] via INTRAVENOUS
  Filled 2011-06-09: qty 5

## 2011-06-09 MED ORDER — SODIUM CHLORIDE 0.9 % IJ SOLN
INTRAMUSCULAR | Status: AC
  Filled 2011-06-09: qty 10

## 2011-06-09 MED ORDER — SODIUM CHLORIDE 0.9 % IV SOLN: INTRAVENOUS | Status: DC

## 2011-06-09 NOTE — Telephone Encounter (Signed)
Unable to leave message mailbox full.

## 2011-06-10 ENCOUNTER — Other Ambulatory Visit: Payer: Self-pay | Admitting: Certified Registered Nurse Anesthetist

## 2011-06-10 ENCOUNTER — Other Ambulatory Visit (HOSPITAL_COMMUNITY): Payer: Self-pay | Admitting: *Deleted

## 2011-06-11 ENCOUNTER — Encounter (HOSPITAL_COMMUNITY): Payer: Medicaid Other

## 2011-06-11 ENCOUNTER — Other Ambulatory Visit (HOSPITAL_COMMUNITY): Payer: Self-pay | Admitting: Oncology

## 2011-06-11 ENCOUNTER — Encounter (HOSPITAL_BASED_OUTPATIENT_CLINIC_OR_DEPARTMENT_OTHER): Payer: Medicaid Other

## 2011-06-11 DIAGNOSIS — C20 Malignant neoplasm of rectum: Secondary | ICD-10-CM

## 2011-06-11 DIAGNOSIS — Z5111 Encounter for antineoplastic chemotherapy: Secondary | ICD-10-CM

## 2011-06-11 DIAGNOSIS — I82409 Acute embolism and thrombosis of unspecified deep veins of unspecified lower extremity: Secondary | ICD-10-CM

## 2011-06-11 DIAGNOSIS — C189 Malignant neoplasm of colon, unspecified: Secondary | ICD-10-CM

## 2011-06-11 LAB — PROTIME-INR
INR: 1.86 — ABNORMAL HIGH (ref 0.00–1.49)
Prothrombin Time: 21.8 seconds — ABNORMAL HIGH (ref 11.6–15.2)

## 2011-06-11 MED ORDER — HEPARIN SOD (PORK) LOCK FLUSH 100 UNIT/ML IV SOLN
500.0000 [IU] | Freq: Once | INTRAVENOUS | Status: AC
  Administered 2011-06-11: 500 [IU] via INTRAVENOUS
  Filled 2011-06-11: qty 5

## 2011-06-11 MED ORDER — HEPARIN SOD (PORK) LOCK FLUSH 100 UNIT/ML IV SOLN
INTRAVENOUS | Status: AC
  Administered 2011-06-11: 500 [IU] via INTRAVENOUS
  Filled 2011-06-11: qty 5

## 2011-06-11 MED ORDER — SODIUM CHLORIDE 0.9 % IV SOLN
Freq: Once | INTRAVENOUS | Status: AC
  Administered 2011-06-11: 09:00:00 via INTRAVENOUS

## 2011-06-13 ENCOUNTER — Other Ambulatory Visit (HOSPITAL_COMMUNITY): Payer: Self-pay | Admitting: Oncology

## 2011-06-13 ENCOUNTER — Encounter (HOSPITAL_BASED_OUTPATIENT_CLINIC_OR_DEPARTMENT_OTHER): Payer: Medicaid Other

## 2011-06-13 ENCOUNTER — Telehealth (HOSPITAL_COMMUNITY): Payer: Self-pay | Admitting: *Deleted

## 2011-06-13 DIAGNOSIS — C20 Malignant neoplasm of rectum: Secondary | ICD-10-CM

## 2011-06-13 DIAGNOSIS — Z932 Ileostomy status: Secondary | ICD-10-CM

## 2011-06-13 DIAGNOSIS — Z5111 Encounter for antineoplastic chemotherapy: Secondary | ICD-10-CM

## 2011-06-13 DIAGNOSIS — I82409 Acute embolism and thrombosis of unspecified deep veins of unspecified lower extremity: Secondary | ICD-10-CM

## 2011-06-13 DIAGNOSIS — R198 Other specified symptoms and signs involving the digestive system and abdomen: Secondary | ICD-10-CM

## 2011-06-13 LAB — PROTIME-INR
INR: 1.96 — ABNORMAL HIGH (ref 0.00–1.49)
Prothrombin Time: 22.7 seconds — ABNORMAL HIGH (ref 11.6–15.2)

## 2011-06-13 MED ORDER — HEPARIN SOD (PORK) LOCK FLUSH 100 UNIT/ML IV SOLN
500.0000 [IU] | Freq: Once | INTRAVENOUS | Status: AC
  Administered 2011-06-13: 500 [IU] via INTRAVENOUS
  Filled 2011-06-13: qty 5

## 2011-06-13 MED ORDER — HEPARIN SOD (PORK) LOCK FLUSH 100 UNIT/ML IV SOLN
INTRAVENOUS | Status: AC
  Filled 2011-06-13: qty 5

## 2011-06-13 MED ORDER — SODIUM CHLORIDE 0.9 % IV SOLN
Freq: Once | INTRAVENOUS | Status: AC
  Administered 2011-06-13: 09:00:00 via INTRAVENOUS

## 2011-06-13 NOTE — Telephone Encounter (Signed)
Spoke with Steward Drone. Instructions for Coumadin 6mg  tonight, 6mg  Saturday night, 5mg  Sunday night. Recheck PT level on Monday. Verbalized understanding.

## 2011-06-16 ENCOUNTER — Encounter (HOSPITAL_BASED_OUTPATIENT_CLINIC_OR_DEPARTMENT_OTHER): Payer: Medicaid Other

## 2011-06-16 ENCOUNTER — Other Ambulatory Visit (HOSPITAL_COMMUNITY): Payer: Medicaid Other

## 2011-06-16 ENCOUNTER — Other Ambulatory Visit (HOSPITAL_COMMUNITY): Payer: Self-pay | Admitting: Oncology

## 2011-06-16 DIAGNOSIS — Z5111 Encounter for antineoplastic chemotherapy: Secondary | ICD-10-CM

## 2011-06-16 DIAGNOSIS — I82409 Acute embolism and thrombosis of unspecified deep veins of unspecified lower extremity: Secondary | ICD-10-CM

## 2011-06-16 DIAGNOSIS — R198 Other specified symptoms and signs involving the digestive system and abdomen: Secondary | ICD-10-CM

## 2011-06-16 DIAGNOSIS — C189 Malignant neoplasm of colon, unspecified: Secondary | ICD-10-CM

## 2011-06-16 DIAGNOSIS — E860 Dehydration: Secondary | ICD-10-CM

## 2011-06-16 DIAGNOSIS — Z932 Ileostomy status: Secondary | ICD-10-CM

## 2011-06-16 DIAGNOSIS — C20 Malignant neoplasm of rectum: Secondary | ICD-10-CM

## 2011-06-16 LAB — CBC
HCT: 28.2 % — ABNORMAL LOW (ref 39.0–52.0)
Hemoglobin: 9.4 g/dL — ABNORMAL LOW (ref 13.0–17.0)
MCH: 26.9 pg (ref 26.0–34.0)
MCHC: 33.3 g/dL (ref 30.0–36.0)
MCV: 80.8 fL (ref 78.0–100.0)
Platelets: 228 10*3/uL (ref 150–400)
RBC: 3.49 MIL/uL — ABNORMAL LOW (ref 4.22–5.81)
RDW: 20.4 % — ABNORMAL HIGH (ref 11.5–15.5)
WBC: 6.5 10*3/uL (ref 4.0–10.5)

## 2011-06-16 LAB — DIFFERENTIAL
Basophils Absolute: 0 10*3/uL (ref 0.0–0.1)
Basophils Relative: 0 % (ref 0–1)
Eosinophils Absolute: 0.1 10*3/uL (ref 0.0–0.7)
Eosinophils Relative: 2 % (ref 0–5)
Lymphocytes Relative: 16 % (ref 12–46)
Lymphs Abs: 1 10*3/uL (ref 0.7–4.0)
Monocytes Absolute: 0.9 10*3/uL (ref 0.1–1.0)
Monocytes Relative: 14 % — ABNORMAL HIGH (ref 3–12)
Neutro Abs: 4.4 10*3/uL (ref 1.7–7.7)
Neutrophils Relative %: 68 % (ref 43–77)

## 2011-06-16 LAB — PROTIME-INR
INR: 2.94 — ABNORMAL HIGH (ref 0.00–1.49)
Prothrombin Time: 31.1 seconds — ABNORMAL HIGH (ref 11.6–15.2)

## 2011-06-16 MED ORDER — SODIUM CHLORIDE 0.9 % IJ SOLN
10.0000 mL | INTRAMUSCULAR | Status: DC | PRN
  Filled 2011-06-16: qty 10

## 2011-06-16 MED ORDER — LEUCOVORIN CALCIUM INJECTION 350 MG: 660.0000 mg | Freq: Once | INTRAVENOUS | Status: DC

## 2011-06-16 MED ORDER — SODIUM CHLORIDE 0.9 % IV SOLN: 8.0000 mg | Freq: Once | INTRAVENOUS | Status: DC

## 2011-06-16 MED ORDER — FLUOROURACIL CHEMO INJECTION 5 GM/100ML
3850.0000 mg | INTRAVENOUS | Status: DC
  Administered 2011-06-16: 3850 mg via INTRAVENOUS
  Filled 2011-06-16 (×2): qty 77

## 2011-06-16 MED ORDER — SODIUM CHLORIDE 0.9 % IV SOLN
INTRAVENOUS | Status: DC
  Administered 2011-06-16: 1000 mL via INTRAVENOUS

## 2011-06-16 MED ORDER — SODIUM CHLORIDE 0.9 % IV SOLN
Freq: Once | INTRAVENOUS | Status: AC
  Administered 2011-06-16: 8 mg via INTRAVENOUS
  Filled 2011-06-16: qty 4

## 2011-06-16 MED ORDER — DEXAMETHASONE SODIUM PHOSPHATE 10 MG/ML IJ SOLN: 10.0000 mg | Freq: Once | INTRAMUSCULAR | Status: DC

## 2011-06-16 MED ORDER — FLUOROURACIL CHEMO INJECTION 2.5 GM/50ML
700.0000 mg | Freq: Once | INTRAVENOUS | Status: AC
  Administered 2011-06-16: 700 mg via INTRAVENOUS
  Filled 2011-06-16: qty 14

## 2011-06-16 MED ORDER — LEUCOVORIN CALCIUM INJECTION 100 MG
42.0000 mg | Freq: Once | INTRAMUSCULAR | Status: AC
  Administered 2011-06-16: 42 mg via INTRAVENOUS
  Filled 2011-06-16: qty 2.1

## 2011-06-16 MED ORDER — HEPARIN SOD (PORK) LOCK FLUSH 100 UNIT/ML IV SOLN
500.0000 [IU] | Freq: Once | INTRAVENOUS | Status: DC
  Filled 2011-06-16: qty 5

## 2011-06-16 MED ORDER — HEPARIN SOD (PORK) LOCK FLUSH 100 UNIT/ML IV SOLN
500.0000 [IU] | Freq: Once | INTRAVENOUS | Status: DC | PRN
  Filled 2011-06-16: qty 5

## 2011-06-16 MED ORDER — OXALIPLATIN CHEMO INJECTION 100 MG/20ML
119.0000 mg | Freq: Once | INTRAVENOUS | Status: AC
  Administered 2011-06-16: 120 mg via INTRAVENOUS
  Filled 2011-06-16: qty 24

## 2011-06-16 NOTE — Progress Notes (Signed)
Spoke with pt while in treatment area. Pt knows to continue same dose of Coumadin and we will recheck pt/inr when he is here Wed for fluids.

## 2011-06-16 NOTE — Progress Notes (Signed)
Tolerated chemo and fluids well. 

## 2011-06-18 ENCOUNTER — Other Ambulatory Visit (HOSPITAL_COMMUNITY): Payer: Self-pay | Admitting: Oncology

## 2011-06-18 ENCOUNTER — Encounter: Payer: Self-pay | Admitting: *Deleted

## 2011-06-18 ENCOUNTER — Encounter (HOSPITAL_BASED_OUTPATIENT_CLINIC_OR_DEPARTMENT_OTHER): Payer: Medicaid Other

## 2011-06-18 DIAGNOSIS — I82409 Acute embolism and thrombosis of unspecified deep veins of unspecified lower extremity: Secondary | ICD-10-CM

## 2011-06-18 DIAGNOSIS — Z923 Personal history of irradiation: Secondary | ICD-10-CM | POA: Insufficient documentation

## 2011-06-18 DIAGNOSIS — Z5111 Encounter for antineoplastic chemotherapy: Secondary | ICD-10-CM

## 2011-06-18 DIAGNOSIS — R198 Other specified symptoms and signs involving the digestive system and abdomen: Secondary | ICD-10-CM

## 2011-06-18 DIAGNOSIS — E860 Dehydration: Secondary | ICD-10-CM

## 2011-06-18 DIAGNOSIS — C189 Malignant neoplasm of colon, unspecified: Secondary | ICD-10-CM

## 2011-06-18 DIAGNOSIS — Z932 Ileostomy status: Secondary | ICD-10-CM

## 2011-06-18 DIAGNOSIS — C20 Malignant neoplasm of rectum: Secondary | ICD-10-CM

## 2011-06-18 LAB — PROTIME-INR
INR: 2.46 — ABNORMAL HIGH (ref 0.00–1.49)
Prothrombin Time: 27.1 seconds — ABNORMAL HIGH (ref 11.6–15.2)

## 2011-06-18 MED ORDER — HEPARIN SOD (PORK) LOCK FLUSH 100 UNIT/ML IV SOLN
500.0000 [IU] | Freq: Once | INTRAVENOUS | Status: DC | PRN
  Filled 2011-06-18: qty 5

## 2011-06-18 MED ORDER — SODIUM CHLORIDE 0.9 % IJ SOLN
10.0000 mL | INTRAMUSCULAR | Status: DC | PRN
  Filled 2011-06-18: qty 10

## 2011-06-18 MED ORDER — SODIUM CHLORIDE 0.9 % IJ SOLN
INTRAMUSCULAR | Status: AC
  Filled 2011-06-18: qty 10

## 2011-06-18 MED ORDER — SODIUM CHLORIDE 0.9 % IV SOLN
INTRAVENOUS | Status: DC
  Administered 2011-06-18: 11:00:00 via INTRAVENOUS

## 2011-06-18 MED ORDER — HEPARIN SOD (PORK) LOCK FLUSH 100 UNIT/ML IV SOLN
INTRAVENOUS | Status: AC
  Filled 2011-06-18: qty 5

## 2011-06-18 NOTE — Progress Notes (Signed)
Lives in [place], married, 2 children, Conservator, museum/gallery

## 2011-06-18 NOTE — Telephone Encounter (Signed)
Tolerated well

## 2011-06-18 NOTE — Progress Notes (Signed)
Tolerated well

## 2011-06-20 ENCOUNTER — Encounter (HOSPITAL_BASED_OUTPATIENT_CLINIC_OR_DEPARTMENT_OTHER): Payer: Medicaid Other | Admitting: Oncology

## 2011-06-20 ENCOUNTER — Ambulatory Visit: Payer: Medicaid Other | Admitting: Radiation Oncology

## 2011-06-20 ENCOUNTER — Encounter (HOSPITAL_BASED_OUTPATIENT_CLINIC_OR_DEPARTMENT_OTHER): Payer: Medicaid Other

## 2011-06-20 ENCOUNTER — Telehealth (HOSPITAL_COMMUNITY): Payer: Self-pay | Admitting: *Deleted

## 2011-06-20 ENCOUNTER — Ambulatory Visit (HOSPITAL_COMMUNITY): Payer: Medicaid Other | Admitting: Oncology

## 2011-06-20 DIAGNOSIS — E860 Dehydration: Secondary | ICD-10-CM

## 2011-06-20 DIAGNOSIS — C189 Malignant neoplasm of colon, unspecified: Secondary | ICD-10-CM

## 2011-06-20 DIAGNOSIS — Z932 Ileostomy status: Secondary | ICD-10-CM

## 2011-06-20 DIAGNOSIS — I82409 Acute embolism and thrombosis of unspecified deep veins of unspecified lower extremity: Secondary | ICD-10-CM

## 2011-06-20 DIAGNOSIS — R198 Other specified symptoms and signs involving the digestive system and abdomen: Secondary | ICD-10-CM

## 2011-06-20 DIAGNOSIS — C20 Malignant neoplasm of rectum: Secondary | ICD-10-CM

## 2011-06-20 DIAGNOSIS — Z5111 Encounter for antineoplastic chemotherapy: Secondary | ICD-10-CM

## 2011-06-20 LAB — PROTIME-INR
INR: 2.15 — ABNORMAL HIGH (ref 0.00–1.49)
Prothrombin Time: 24.4 seconds — ABNORMAL HIGH (ref 11.6–15.2)

## 2011-06-20 MED ORDER — SODIUM CHLORIDE 0.9 % IV SOLN
INTRAVENOUS | Status: DC
Start: 2011-06-20 — End: 2011-06-20
  Administered 2011-06-20: 1000 mL via INTRAVENOUS

## 2011-06-20 MED ORDER — SODIUM CHLORIDE 0.9 % IV SOLN
INTRAVENOUS | Status: DC
  Administered 2011-06-20: 09:00:00 via INTRAVENOUS

## 2011-06-20 MED ORDER — HEPARIN SOD (PORK) LOCK FLUSH 100 UNIT/ML IV SOLN
500.0000 [IU] | Freq: Once | INTRAVENOUS | Status: AC
  Administered 2011-06-20: 500 [IU] via INTRAVENOUS
  Filled 2011-06-20: qty 5

## 2011-06-20 MED ORDER — HEPARIN SOD (PORK) LOCK FLUSH 100 UNIT/ML IV SOLN
INTRAVENOUS | Status: AC
  Administered 2011-06-20: 500 [IU] via INTRAVENOUS
  Filled 2011-06-20: qty 5

## 2011-06-20 NOTE — Progress Notes (Signed)
Charles [name]., MD, MD [address] [place] [address]  1. DVT (deep venous thrombosis)  Protime-INR  2. Colon cancer      CURRENT THERAPY:S/P cycle 10 of FOLFOX starting on 02/05/11. Cycle 8 was deferred for 1 week do to diagnosis of DVT. S/P proctectomy with colorectal anastomosis and diverting loop ileostomy on 11/14/10 at [facility] by Dr. Rae Halsted.   INTERVAL HISTORY: Charles [name] 61 y.o. male returns for  regular  visit for followup of  Stage III colorectal cancer.   Since the patient has been placed on fluids on Mondays, Wednesdays, and Fridays, the patient reports that he feels much better.  He reports that prior to fluids, he can only walk for short distance and was unable to feed his animals, now that we are on this fluid regimen, he is feeding his animal and performing activities he once to do. He does admit to some shortness of breath and easy fatigability compared to prior to diagnosis.  The patient is pleased that he only has 2 more cycles of chemotherapy. He will complete 12 cycles of FOLFOX therapy in about one month's time. I've asked him to contact his surgeon at [facility] after he completed all 12 cycles, for consideration a reversal of his diverting loop ileostomy. I think this reversal will certainly benefit the patient in regards to his significant amount of fluid loss. In the interim, I told the patient that we will give him fluids to offset his fluid output through his ileostomy.  The patient is tolerating therapy very well. He is on Coumadin for blood clots. We have been managing him with a dose of 5 mg alternating with 6 mg. This has maintained a therapeutic level with some minimal alterations. He will need to be anticoagulated for at least 6 months to a year.  Otherwise, the patient is doing extremely well. He is tolerating therapy. He denies any complaints. He continues to have watery stools and his ileostomy. He is certainly looking forward to completion of  chemotherapy with the reversal of his ileostomy.  Past Medical History  Diagnosis Date  . HTN (hypertension)   . Pulmonary embolism   . GERD (gastroesophageal reflux disease)   . Depression 04/01/2011  . DVT (deep venous thrombosis) 05/09/2011  . 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 Colon cancer 07/24/10    rectal ca, inv adenocarcinoma  . Hx of radiation therapy 09/02/10 to 10/14/10    pelvis    has GERD; DIARRHEA; Colon cancer; Weight loss, abnormal; Dehydration; Depression; DVT (deep venous thrombosis); High output ileostomy; and Hx of radiation therapy on his problem list.      has no known allergies.  Mr. Repass had no medications administered during this visit.  Past Surgical History  Procedure Date  . Esophagogastroduodenoscopy 07/2010    schatki ring s/p dilation, small hh, SB bx benign  . Colonoscopy 07/2010    proximal rectal apple core mass 10-14cm from anal verge (adenocarcinoma), 2-3cm distal rectal carpet polyp s/p piecemeal snare polypectomy (adenoma)  . Eus 08/2010    Dr. Rob Bunting. uT3N0 circumferential, nearly obstruction rectosigmoid adenocarcinoma, distal edge 12cm from anal verge  . Ivc filter   . Colon surgery 11/14/2010    proctectomy with colorectal anastomosis and diverting loop ileostomy (temporary planned)  . Port a cath placement     Denies any headaches, dizziness, double vision, fevers, chills, night sweats, nausea, vomiting, diarrhea, constipation, chest pain, heart palpitations, shortness of breath, blood in stool, black  tarry stool, urinary pain, urinary burning, urinary frequency, hematuria.   PHYSICAL EXAMINATION  ECOG PERFORMANCE STATUS: 1 - Symptomatic but completely ambulatory  There were no vitals filed for this visit.  GENERAL:alert, no distress, well nourished, well developed, comfortable, cooperative and smiling SKIN: skin color, texture, turgor are normal HEAD: Normocephalic EYES: normal EARS: External ears  normal OROPHARYNX:mucous membranes are moist  NECK: supple, trachea midline LYMPH:  no palpable lymphadenopathy, no hepatosplenomegaly BREAST:not examined LUNGS: clear to auscultation and percussion HEART: regular rate & rhythm, no murmurs, no gallops, S1 normal and S2 normal ABDOMEN:abdomen soft, non-tender and normal bowel sounds BACK: Back symmetric, no curvature., No CVA tenderness EXTREMITIES:less then 2 second capillary refill, no joint deformities, effusion, or inflammation, no edema, no skin discoloration, no clubbing, no cyanosis  NEURO: alert & oriented x 3 with fluent speech, no focal motor/sensory deficits, gait normal    LABORATORY DATA: CBC    Component Value Date/Time   WBC 6.5 06/16/2011 1246   WBC 6.5 02/04/2011 1059   RBC 3.49* 06/16/2011 1246   RBC 4.57 02/04/2011 1059   HGB 9.4* 06/16/2011 1246   HGB 11.6* 02/04/2011 1059   HCT 28.2* 06/16/2011 1246   HCT 34.7* 02/04/2011 1059   PLT 228 06/16/2011 1246   PLT 282 02/04/2011 1059   MCV 80.8 06/16/2011 1246   MCV 76.0* 02/04/2011 1059   MCH 26.9 06/16/2011 1246   MCH 25.4* 02/04/2011 1059   MCHC 33.3 06/16/2011 1246   MCHC 33.5 02/04/2011 1059   RDW 20.4* 06/16/2011 1246   RDW 18.5* 02/04/2011 1059   LYMPHSABS 1.0 06/16/2011 1246   LYMPHSABS 1.1 02/04/2011 1059   MONOABS 0.9 06/16/2011 1246   MONOABS 0.5 02/04/2011 1059   EOSABS 0.1 06/16/2011 1246   EOSABS 0.1 02/04/2011 1059   BASOSABS 0.0 06/16/2011 1246   BASOSABS 0.1 02/04/2011 1059      Chemistry      Component Value Date/Time   NA 132* 05/30/2011 0836   NA 136 10/30/2010 0801   K 3.6 05/30/2011 0836   K 4.0 10/30/2010 0801   CL 105 05/30/2011 0836   CL 101 10/30/2010 0801   CO2 19 05/30/2011 0836   CO2 27 10/30/2010 0801   BUN 5* 05/30/2011 0836   BUN 9 10/30/2010 0801   CREATININE 0.68 05/30/2011 0836   CREATININE 0.83 02/10/2011 1037      Component Value Date/Time   CALCIUM 8.8 05/30/2011 0836   CALCIUM 8.9 10/30/2010 0801   ALKPHOS 94 05/30/2011 0836   ALKPHOS 89*  10/30/2010 0801   AST 14 05/30/2011 0836   AST 17 10/30/2010 0801   ALT 18 05/30/2011 0836   BILITOT 0.4 05/30/2011 0836   BILITOT 1.00 10/30/2010 0801     Lab Results  Component Value Date   INR 2.15* 06/20/2011   INR 2.46* 06/18/2011   INR 2.94* 06/16/2011      ASSESSMENT:  1. Stage III Rectal cancer, S/P radiation and chemotherapy, then surgical resection, now on FOLFOX chemotherapy.  2. S/P IVC filter placement  3. On Coumadin anticoagulation for PE in the past.  4. Chronic diarrhea, followed by gastroenterology.  5. Depression, on citalopram with good results  6. DVT dx on May 06 2011 while on Coumadin, but subtherapeutic 1 week prior to Hospital visit at 1.2 according to the patient.  7. Poor appetite  8. Dizziness  8. Fatigue   PLAN:  1. Continue with fluids on Mondays, Wednesdays, and Fridays. 2. Same dose of Coumadin and  PT/INR rechecked on Monday with fluids/chemotherapy. 3. CT scan of Abd/Pelvis with contrast one year following completion of treatment. 4. Continue with chemotherapy as scheduled. 5. Continue fluid replacement M/W/F. 6. Patient will contact surgeon at completion of chemotherapy. 7. Return in one month for follow-up.   All questions were answered. The patient knows to call the clinic with any problems, questions or concerns. We can certainly see the patient much sooner if necessary.  The patient and plan discussed with Glenford Peers, MD and he is in agreement with the aforementioned.   Dennys Guin

## 2011-06-20 NOTE — Telephone Encounter (Signed)
Message copied by Dennie Maizes on Fri Jun 20, 2011 11:48 AM ------      Message from: Ellouise Newer III      Created: Fri Jun 20, 2011 11:08 AM       Same dose            PT/INR on Monday with fluids/chemo

## 2011-06-20 NOTE — Telephone Encounter (Signed)
Spoke with Vevay as below. Verbalized understanding.

## 2011-06-20 NOTE — Progress Notes (Signed)
Tolerated fluids well today.  Pt/INR done earlier and sent to lab. Specimen was from port.

## 2011-06-23 ENCOUNTER — Other Ambulatory Visit (HOSPITAL_COMMUNITY): Payer: Self-pay | Admitting: Oncology

## 2011-06-23 ENCOUNTER — Encounter (HOSPITAL_BASED_OUTPATIENT_CLINIC_OR_DEPARTMENT_OTHER): Payer: Medicaid Other

## 2011-06-23 DIAGNOSIS — Z5111 Encounter for antineoplastic chemotherapy: Secondary | ICD-10-CM

## 2011-06-23 DIAGNOSIS — C20 Malignant neoplasm of rectum: Secondary | ICD-10-CM

## 2011-06-23 DIAGNOSIS — Z932 Ileostomy status: Secondary | ICD-10-CM

## 2011-06-23 DIAGNOSIS — R198 Other specified symptoms and signs involving the digestive system and abdomen: Secondary | ICD-10-CM

## 2011-06-23 DIAGNOSIS — I82409 Acute embolism and thrombosis of unspecified deep veins of unspecified lower extremity: Secondary | ICD-10-CM

## 2011-06-23 DIAGNOSIS — C189 Malignant neoplasm of colon, unspecified: Secondary | ICD-10-CM

## 2011-06-23 LAB — PROTIME-INR
INR: 2.03 — ABNORMAL HIGH (ref 0.00–1.49)
Prothrombin Time: 23.3 seconds — ABNORMAL HIGH (ref 11.6–15.2)

## 2011-06-23 MED ORDER — HEPARIN SOD (PORK) LOCK FLUSH 100 UNIT/ML IV SOLN
500.0000 [IU] | Freq: Once | INTRAVENOUS | Status: AC
  Administered 2011-06-23: 500 [IU] via INTRAVENOUS
  Filled 2011-06-23: qty 5

## 2011-06-23 MED ORDER — HEPARIN SOD (PORK) LOCK FLUSH 100 UNIT/ML IV SOLN
INTRAVENOUS | Status: AC
  Filled 2011-06-23: qty 5

## 2011-06-23 MED ORDER — SODIUM CHLORIDE 0.9 % IJ SOLN
10.0000 mL | INTRAMUSCULAR | Status: DC | PRN
  Administered 2011-06-23: 10 mL via INTRAVENOUS
  Filled 2011-06-23: qty 10

## 2011-06-23 MED ORDER — SODIUM CHLORIDE 0.9 % IV SOLN
Freq: Once | INTRAVENOUS | Status: AC
  Administered 2011-06-23: 2000 mL via INTRAVENOUS

## 2011-06-23 NOTE — Progress Notes (Signed)
tolerated fluids well.

## 2011-06-23 NOTE — Progress Notes (Signed)
Pt notified to continue the same dose of coumadin and pt level on wed.

## 2011-06-24 ENCOUNTER — Other Ambulatory Visit: Payer: Self-pay | Admitting: Certified Registered Nurse Anesthetist

## 2011-06-25 ENCOUNTER — Other Ambulatory Visit (HOSPITAL_COMMUNITY): Payer: Medicaid Other

## 2011-06-25 ENCOUNTER — Other Ambulatory Visit (HOSPITAL_COMMUNITY): Payer: Self-pay | Admitting: Oncology

## 2011-06-25 ENCOUNTER — Encounter (HOSPITAL_BASED_OUTPATIENT_CLINIC_OR_DEPARTMENT_OTHER): Payer: Medicaid Other

## 2011-06-25 DIAGNOSIS — E860 Dehydration: Secondary | ICD-10-CM

## 2011-06-25 DIAGNOSIS — R198 Other specified symptoms and signs involving the digestive system and abdomen: Secondary | ICD-10-CM

## 2011-06-25 DIAGNOSIS — Z5111 Encounter for antineoplastic chemotherapy: Secondary | ICD-10-CM

## 2011-06-25 DIAGNOSIS — I82409 Acute embolism and thrombosis of unspecified deep veins of unspecified lower extremity: Secondary | ICD-10-CM

## 2011-06-25 DIAGNOSIS — C20 Malignant neoplasm of rectum: Secondary | ICD-10-CM

## 2011-06-25 DIAGNOSIS — Z932 Ileostomy status: Secondary | ICD-10-CM

## 2011-06-25 LAB — PROTIME-INR
INR: 2.82 — ABNORMAL HIGH (ref 0.00–1.49)
Prothrombin Time: 30.1 seconds — ABNORMAL HIGH (ref 11.6–15.2)

## 2011-06-25 MED ORDER — HEPARIN SOD (PORK) LOCK FLUSH 100 UNIT/ML IV SOLN
500.0000 [IU] | Freq: Once | INTRAVENOUS | Status: AC
  Administered 2011-06-25: 500 [IU] via INTRAVENOUS
  Filled 2011-06-25: qty 5

## 2011-06-25 MED ORDER — HEPARIN SOD (PORK) LOCK FLUSH 100 UNIT/ML IV SOLN
INTRAVENOUS | Status: AC
  Administered 2011-06-25: 500 [IU] via INTRAVENOUS
  Filled 2011-06-25: qty 5

## 2011-06-25 MED ORDER — SODIUM CHLORIDE 0.9 % IV SOLN
INTRAVENOUS | Status: DC
  Administered 2011-06-25: 10:00:00 via INTRAVENOUS

## 2011-06-25 NOTE — Progress Notes (Signed)
Instructed pt to continue same dose Coumadin and we will recheck on Friday. Pt verbalized understanding.

## 2011-06-27 ENCOUNTER — Encounter (HOSPITAL_BASED_OUTPATIENT_CLINIC_OR_DEPARTMENT_OTHER): Payer: Medicaid Other

## 2011-06-27 DIAGNOSIS — I82409 Acute embolism and thrombosis of unspecified deep veins of unspecified lower extremity: Secondary | ICD-10-CM

## 2011-06-27 DIAGNOSIS — Z932 Ileostomy status: Secondary | ICD-10-CM

## 2011-06-27 DIAGNOSIS — E860 Dehydration: Secondary | ICD-10-CM

## 2011-06-27 DIAGNOSIS — Z5111 Encounter for antineoplastic chemotherapy: Secondary | ICD-10-CM

## 2011-06-27 DIAGNOSIS — C189 Malignant neoplasm of colon, unspecified: Secondary | ICD-10-CM

## 2011-06-27 DIAGNOSIS — C20 Malignant neoplasm of rectum: Secondary | ICD-10-CM

## 2011-06-27 DIAGNOSIS — R198 Other specified symptoms and signs involving the digestive system and abdomen: Secondary | ICD-10-CM

## 2011-06-27 LAB — DIFFERENTIAL
Basophils Absolute: 0 10*3/uL (ref 0.0–0.1)
Basophils Relative: 1 % (ref 0–1)
Eosinophils Absolute: 0.1 10*3/uL (ref 0.0–0.7)
Eosinophils Relative: 1 % (ref 0–5)
Lymphocytes Relative: 25 % (ref 12–46)
Lymphs Abs: 1.4 10*3/uL (ref 0.7–4.0)
Monocytes Absolute: 0.6 10*3/uL (ref 0.1–1.0)
Monocytes Relative: 10 % (ref 3–12)
Neutro Abs: 3.6 10*3/uL (ref 1.7–7.7)
Neutrophils Relative %: 64 % (ref 43–77)

## 2011-06-27 LAB — COMPREHENSIVE METABOLIC PANEL
ALT: 17 U/L (ref 0–53)
AST: 16 U/L (ref 0–37)
Albumin: 3.1 g/dL — ABNORMAL LOW (ref 3.5–5.2)
Alkaline Phosphatase: 71 U/L (ref 39–117)
BUN: 6 mg/dL (ref 6–23)
CO2: 20 mEq/L (ref 19–32)
Calcium: 9.4 mg/dL (ref 8.4–10.5)
Chloride: 105 mEq/L (ref 96–112)
Creatinine, Ser: 0.68 mg/dL (ref 0.50–1.35)
GFR calc Af Amer: >90 mL/min (ref >90)
GFR calc non Af Amer: >90 mL/min (ref >90)
Glucose, Bld: 127 mg/dL — ABNORMAL HIGH (ref 70–99)
Potassium: 2.9 mEq/L — ABNORMAL LOW (ref 3.5–5.1)
Sodium: 137 mEq/L (ref 135–145)
Total Bilirubin: 0.7 mg/dL (ref 0.3–1.2)
Total Protein: 6.5 g/dL (ref 6.0–8.3)

## 2011-06-27 LAB — CBC
HCT: 33.4 % — ABNORMAL LOW (ref 39.0–52.0)
Hemoglobin: 11.5 g/dL — ABNORMAL LOW (ref 13.0–17.0)
MCH: 27.4 pg (ref 26.0–34.0)
MCHC: 34.4 g/dL (ref 30.0–36.0)
MCV: 79.5 fL (ref 78.0–100.0)
Platelets: 266 10*3/uL (ref 150–400)
RBC: 4.2 MIL/uL — ABNORMAL LOW (ref 4.22–5.81)
RDW: 20.4 % — ABNORMAL HIGH (ref 11.5–15.5)
WBC: 5.6 10*3/uL (ref 4.0–10.5)

## 2011-06-27 LAB — PROTIME-INR
INR: 3.06 — ABNORMAL HIGH (ref 0.00–1.49)
Prothrombin Time: 32.1 seconds — ABNORMAL HIGH (ref 11.6–15.2)

## 2011-06-27 MED ORDER — HEPARIN SOD (PORK) LOCK FLUSH 100 UNIT/ML IV SOLN
INTRAVENOUS | Status: AC
  Administered 2011-06-27: 500 [IU] via INTRAVENOUS
  Filled 2011-06-27: qty 5

## 2011-06-27 MED ORDER — SODIUM CHLORIDE 0.9 % IV SOLN
INTRAVENOUS | Status: DC
  Administered 2011-06-27: 09:00:00 via INTRAVENOUS

## 2011-06-27 MED ORDER — HEPARIN SOD (PORK) LOCK FLUSH 100 UNIT/ML IV SOLN
500.0000 [IU] | Freq: Once | INTRAVENOUS | Status: AC
  Administered 2011-06-27: 500 [IU] via INTRAVENOUS
  Filled 2011-06-27: qty 5

## 2011-06-27 NOTE — Progress Notes (Signed)
Pt reports he has been out of potassium 3-4 days, he is picking up Francelia Mclaren prescription today. Instruct he is to take potassium 20 meq 4 x daily over the weekend and we will recheck level on Monday. Also instruct to take Coumadin 5 mg daily over the weekend and we will recheck on Monday as well. Pt and wife verbalized understanding.

## 2011-06-30 ENCOUNTER — Encounter (HOSPITAL_BASED_OUTPATIENT_CLINIC_OR_DEPARTMENT_OTHER): Payer: Medicaid Other

## 2011-06-30 ENCOUNTER — Other Ambulatory Visit (HOSPITAL_COMMUNITY): Payer: Self-pay | Admitting: Oncology

## 2011-06-30 DIAGNOSIS — C189 Malignant neoplasm of colon, unspecified: Secondary | ICD-10-CM

## 2011-06-30 DIAGNOSIS — R198 Other specified symptoms and signs involving the digestive system and abdomen: Secondary | ICD-10-CM

## 2011-06-30 DIAGNOSIS — C20 Malignant neoplasm of rectum: Secondary | ICD-10-CM

## 2011-06-30 DIAGNOSIS — Z5111 Encounter for antineoplastic chemotherapy: Secondary | ICD-10-CM

## 2011-06-30 DIAGNOSIS — I82409 Acute embolism and thrombosis of unspecified deep veins of unspecified lower extremity: Secondary | ICD-10-CM

## 2011-06-30 DIAGNOSIS — E860 Dehydration: Secondary | ICD-10-CM

## 2011-06-30 LAB — CBC
HCT: 32.5 % — ABNORMAL LOW (ref 39.0–52.0)
Hemoglobin: 11 g/dL — ABNORMAL LOW (ref 13.0–17.0)
MCH: 27.3 pg (ref 26.0–34.0)
MCHC: 33.8 g/dL (ref 30.0–36.0)
MCV: 80.6 fL (ref 78.0–100.0)
Platelets: 243 10*3/uL (ref 150–400)
RBC: 4.03 MIL/uL — ABNORMAL LOW (ref 4.22–5.81)
RDW: 20.3 % — ABNORMAL HIGH (ref 11.5–15.5)
WBC: 4.7 10*3/uL (ref 4.0–10.5)

## 2011-06-30 LAB — COMPREHENSIVE METABOLIC PANEL
ALT: 14 U/L (ref 0–53)
AST: 17 U/L (ref 0–37)
Albumin: 3.1 g/dL — ABNORMAL LOW (ref 3.5–5.2)
Alkaline Phosphatase: 70 U/L (ref 39–117)
BUN: 7 mg/dL (ref 6–23)
CO2: 19 mEq/L (ref 19–32)
Calcium: 9.1 mg/dL (ref 8.4–10.5)
Chloride: 106 mEq/L (ref 96–112)
Creatinine, Ser: 0.68 mg/dL (ref 0.50–1.35)
GFR calc Af Amer: >90 mL/min (ref >90)
GFR calc non Af Amer: >90 mL/min (ref >90)
Glucose, Bld: 135 mg/dL — ABNORMAL HIGH (ref 70–99)
Potassium: 3.6 mEq/L (ref 3.5–5.1)
Sodium: 136 mEq/L (ref 135–145)
Total Bilirubin: 1 mg/dL (ref 0.3–1.2)
Total Protein: 6.4 g/dL (ref 6.0–8.3)

## 2011-06-30 LAB — DIFFERENTIAL
Basophils Absolute: 0 10*3/uL (ref 0.0–0.1)
Basophils Relative: 0 % (ref 0–1)
Eosinophils Absolute: 0.1 10*3/uL (ref 0.0–0.7)
Eosinophils Relative: 2 % (ref 0–5)
Lymphocytes Relative: 22 % (ref 12–46)
Lymphs Abs: 1 10*3/uL (ref 0.7–4.0)
Monocytes Absolute: 0.5 10*3/uL (ref 0.1–1.0)
Monocytes Relative: 11 % (ref 3–12)
Neutro Abs: 3 10*3/uL (ref 1.7–7.7)
Neutrophils Relative %: 65 % (ref 43–77)

## 2011-06-30 LAB — PROTIME-INR
INR: 3.23 — ABNORMAL HIGH (ref 0.00–1.49)
Prothrombin Time: 33.5 seconds — ABNORMAL HIGH (ref 11.6–15.2)

## 2011-06-30 MED ORDER — OXALIPLATIN CHEMO INJECTION 100 MG/20ML
119.0000 mg | Freq: Once | INTRAVENOUS | Status: AC
  Administered 2011-06-30: 120 mg via INTRAVENOUS
  Filled 2011-06-30: qty 24

## 2011-06-30 MED ORDER — SODIUM CHLORIDE 0.9 % IV SOLN
INTRAVENOUS | Status: DC
  Administered 2011-06-30: 500 mL via INTRAVENOUS

## 2011-06-30 MED ORDER — DEXTROSE 5 % IV SOLN
50.0000 mL | INTRAVENOUS | Status: AC
  Administered 2011-06-30: 50 mL via INTRAVENOUS
  Filled 2011-06-30 (×2): qty 50

## 2011-06-30 MED ORDER — LEUCOVORIN CALCIUM INJECTION 350 MG: 660.0000 mg | Freq: Once | INTRAVENOUS | Status: DC

## 2011-06-30 MED ORDER — SODIUM CHLORIDE 0.9 % IV SOLN
3850.0000 mg | INTRAVENOUS | Status: DC
  Administered 2011-06-30: 3850 mg via INTRAVENOUS
  Filled 2011-06-30 (×2): qty 77

## 2011-06-30 MED ORDER — SODIUM CHLORIDE 0.9 % IV SOLN
Freq: Once | INTRAVENOUS | Status: AC
  Administered 2011-06-30: 8 mg via INTRAVENOUS
  Filled 2011-06-30: qty 4

## 2011-06-30 MED ORDER — LEUCOVORIN CALCIUM INJECTION 100 MG
42.0000 mg | Freq: Once | INTRAMUSCULAR | Status: AC
  Administered 2011-06-30: 42 mg via INTRAVENOUS
  Filled 2011-06-30: qty 2.1

## 2011-06-30 MED ORDER — DEXAMETHASONE SODIUM PHOSPHATE 10 MG/ML IJ SOLN: 10.0000 mg | Freq: Once | INTRAMUSCULAR | Status: DC

## 2011-06-30 MED ORDER — SODIUM CHLORIDE 0.9 % IJ SOLN
10.0000 mL | INTRAMUSCULAR | Status: DC | PRN
  Filled 2011-06-30: qty 10

## 2011-06-30 MED ORDER — DEXTROSE 5 % IV SOLN
Freq: Once | INTRAVENOUS | Status: AC
  Administered 2011-06-30: 12:00:00 via INTRAVENOUS
  Filled 2011-06-30: qty 1000

## 2011-06-30 MED ORDER — SODIUM CHLORIDE 0.9 % IV SOLN: 8.0000 mg | Freq: Once | INTRAVENOUS | Status: DC

## 2011-06-30 MED ORDER — FLUOROURACIL CHEMO INJECTION 2.5 GM/50ML
700.0000 mg | Freq: Once | INTRAVENOUS | Status: AC
  Administered 2011-06-30: 700 mg via INTRAVENOUS
  Filled 2011-06-30: qty 14

## 2011-06-30 NOTE — Progress Notes (Signed)
Charles [name] tolerated Eloxatin/Leucovorin/Adrucil (push and home infusion) infusion well and without incident; verbalizes understanding for follow-up.  No distress noted at time of discharge and patient was discharged home with his wife.  Home infusion pump setup and infusion w/o incident @ present - pt verbalizes understanding of when to return to d/c.

## 2011-07-02 ENCOUNTER — Other Ambulatory Visit (HOSPITAL_COMMUNITY): Payer: Self-pay | Admitting: Oncology

## 2011-07-02 ENCOUNTER — Encounter (HOSPITAL_BASED_OUTPATIENT_CLINIC_OR_DEPARTMENT_OTHER): Payer: Medicaid Other

## 2011-07-02 ENCOUNTER — Telehealth (HOSPITAL_COMMUNITY): Payer: Self-pay

## 2011-07-02 VITALS — BP 135/88 | HR 102 | Temp 97.1°F | Wt 185.2 lb

## 2011-07-02 DIAGNOSIS — Z932 Ileostomy status: Secondary | ICD-10-CM

## 2011-07-02 DIAGNOSIS — C20 Malignant neoplasm of rectum: Secondary | ICD-10-CM

## 2011-07-02 DIAGNOSIS — I82409 Acute embolism and thrombosis of unspecified deep veins of unspecified lower extremity: Secondary | ICD-10-CM

## 2011-07-02 DIAGNOSIS — Z5111 Encounter for antineoplastic chemotherapy: Secondary | ICD-10-CM

## 2011-07-02 DIAGNOSIS — C189 Malignant neoplasm of colon, unspecified: Secondary | ICD-10-CM

## 2011-07-02 DIAGNOSIS — R198 Other specified symptoms and signs involving the digestive system and abdomen: Secondary | ICD-10-CM

## 2011-07-02 DIAGNOSIS — E860 Dehydration: Secondary | ICD-10-CM

## 2011-07-02 LAB — PROTIME-INR
INR: 4.42 — ABNORMAL HIGH (ref 0.00–1.49)
Prothrombin Time: 42.8 seconds — ABNORMAL HIGH (ref 11.6–15.2)

## 2011-07-02 MED ORDER — SODIUM CHLORIDE 0.9 % IV SOLN
INTRAVENOUS | Status: DC
  Administered 2011-07-02: 09:00:00 via INTRAVENOUS

## 2011-07-02 MED ORDER — HEPARIN SOD (PORK) LOCK FLUSH 100 UNIT/ML IV SOLN
500.0000 [IU] | Freq: Once | INTRAVENOUS | Status: AC | PRN
  Administered 2011-07-02: 500 [IU]
  Filled 2011-07-02: qty 5

## 2011-07-02 MED ORDER — SODIUM CHLORIDE 0.9 % IJ SOLN
10.0000 mL | INTRAMUSCULAR | Status: DC | PRN
  Filled 2011-07-02: qty 10

## 2011-07-02 MED ORDER — SODIUM CHLORIDE 0.9 % IJ SOLN
INTRAMUSCULAR | Status: AC
  Filled 2011-07-02: qty 10

## 2011-07-02 MED ORDER — HEPARIN SOD (PORK) LOCK FLUSH 100 UNIT/ML IV SOLN
INTRAVENOUS | Status: AC
  Filled 2011-07-02: qty 5

## 2011-07-02 NOTE — Telephone Encounter (Signed)
Patient has already taken coumadin tonight.  Discussed with PA and to hold coumadin Thursday night and will get PT/INR rechecked on Friday morning.

## 2011-07-04 ENCOUNTER — Other Ambulatory Visit (HOSPITAL_COMMUNITY): Payer: Self-pay | Admitting: Oncology

## 2011-07-04 ENCOUNTER — Encounter (HOSPITAL_COMMUNITY): Payer: Medicaid Other | Attending: Hematology and Oncology

## 2011-07-04 DIAGNOSIS — C189 Malignant neoplasm of colon, unspecified: Secondary | ICD-10-CM | POA: Insufficient documentation

## 2011-07-04 DIAGNOSIS — E860 Dehydration: Secondary | ICD-10-CM | POA: Insufficient documentation

## 2011-07-04 DIAGNOSIS — C20 Malignant neoplasm of rectum: Secondary | ICD-10-CM

## 2011-07-04 DIAGNOSIS — I82409 Acute embolism and thrombosis of unspecified deep veins of unspecified lower extremity: Secondary | ICD-10-CM | POA: Insufficient documentation

## 2011-07-04 DIAGNOSIS — R198 Other specified symptoms and signs involving the digestive system and abdomen: Secondary | ICD-10-CM

## 2011-07-04 DIAGNOSIS — Z5111 Encounter for antineoplastic chemotherapy: Secondary | ICD-10-CM

## 2011-07-04 DIAGNOSIS — Z932 Ileostomy status: Secondary | ICD-10-CM | POA: Insufficient documentation

## 2011-07-04 LAB — PROTIME-INR
INR: 2.7 — ABNORMAL HIGH (ref 0.00–1.49)
Prothrombin Time: 29.1 seconds — ABNORMAL HIGH (ref 11.6–15.2)

## 2011-07-04 MED ORDER — SODIUM CHLORIDE 0.9 % IJ SOLN
10.0000 mL | INTRAMUSCULAR | Status: DC | PRN
  Administered 2011-07-04: 10 mL via INTRAVENOUS
  Filled 2011-07-04: qty 10

## 2011-07-04 MED ORDER — HEPARIN SOD (PORK) LOCK FLUSH 100 UNIT/ML IV SOLN
500.0000 [IU] | Freq: Once | INTRAVENOUS | Status: AC
  Administered 2011-07-04: 500 [IU] via INTRAVENOUS
  Filled 2011-07-04: qty 5

## 2011-07-04 MED ORDER — HEPARIN SOD (PORK) LOCK FLUSH 100 UNIT/ML IV SOLN
INTRAVENOUS | Status: AC
  Filled 2011-07-04: qty 5

## 2011-07-04 MED ORDER — SODIUM CHLORIDE 0.9 % IV SOLN
INTRAVENOUS | Status: DC
  Administered 2011-07-04: 2000 mL via INTRAVENOUS

## 2011-07-04 MED ORDER — SODIUM CHLORIDE 0.9 % IJ SOLN
INTRAMUSCULAR | Status: AC
  Filled 2011-07-04: qty 10

## 2011-07-04 NOTE — Progress Notes (Signed)
1140  Dr Mariel Sleet made aware if pt/inr results. Pt instructed to decrease Coumadint to 4/5/4/5 alt. And will recheck pt/inr level on Monday.  Tolerated fluids today well.

## 2011-07-07 ENCOUNTER — Encounter (HOSPITAL_COMMUNITY): Payer: Medicaid Other

## 2011-07-07 ENCOUNTER — Other Ambulatory Visit (HOSPITAL_COMMUNITY): Payer: Self-pay | Admitting: Oncology

## 2011-07-07 ENCOUNTER — Telehealth (HOSPITAL_COMMUNITY): Payer: Self-pay

## 2011-07-07 ENCOUNTER — Encounter (HOSPITAL_BASED_OUTPATIENT_CLINIC_OR_DEPARTMENT_OTHER): Payer: Medicaid Other

## 2011-07-07 DIAGNOSIS — E860 Dehydration: Secondary | ICD-10-CM

## 2011-07-07 DIAGNOSIS — Z932 Ileostomy status: Secondary | ICD-10-CM

## 2011-07-07 DIAGNOSIS — I82409 Acute embolism and thrombosis of unspecified deep veins of unspecified lower extremity: Secondary | ICD-10-CM

## 2011-07-07 DIAGNOSIS — C20 Malignant neoplasm of rectum: Secondary | ICD-10-CM

## 2011-07-07 DIAGNOSIS — Z5111 Encounter for antineoplastic chemotherapy: Secondary | ICD-10-CM

## 2011-07-07 DIAGNOSIS — R198 Other specified symptoms and signs involving the digestive system and abdomen: Secondary | ICD-10-CM

## 2011-07-07 LAB — PROTIME-INR
INR: 2.46 — ABNORMAL HIGH (ref 0.00–1.49)
Prothrombin Time: 27.1 seconds — ABNORMAL HIGH (ref 11.6–15.2)

## 2011-07-07 MED ORDER — HEPARIN SOD (PORK) LOCK FLUSH 100 UNIT/ML IV SOLN
INTRAVENOUS | Status: AC
  Filled 2011-07-07: qty 5

## 2011-07-07 MED ORDER — SODIUM CHLORIDE 0.9 % IJ SOLN
10.0000 mL | INTRAMUSCULAR | Status: DC | PRN
  Administered 2011-07-07: 10 mL via INTRAVENOUS
  Filled 2011-07-07: qty 10

## 2011-07-07 MED ORDER — SODIUM CHLORIDE 0.9 % IV SOLN
INTRAVENOUS | Status: DC
  Administered 2011-07-07: 2000 mL via INTRAVENOUS

## 2011-07-07 MED ORDER — HEPARIN SOD (PORK) LOCK FLUSH 100 UNIT/ML IV SOLN
500.0000 [IU] | Freq: Once | INTRAVENOUS | Status: AC
  Administered 2011-07-07: 500 [IU] via INTRAVENOUS
  Filled 2011-07-07: qty 5

## 2011-07-07 NOTE — Progress Notes (Signed)
Tolerated fluids well.

## 2011-07-07 NOTE — Telephone Encounter (Signed)
Patient called regarding lab results and Coumadin dose.  Patient's wife verbalized understanding.

## 2011-07-09 ENCOUNTER — Other Ambulatory Visit (HOSPITAL_COMMUNITY): Payer: Self-pay | Admitting: Oncology

## 2011-07-09 ENCOUNTER — Encounter (HOSPITAL_BASED_OUTPATIENT_CLINIC_OR_DEPARTMENT_OTHER): Payer: Medicaid Other

## 2011-07-09 ENCOUNTER — Encounter (HOSPITAL_COMMUNITY): Payer: Medicaid Other

## 2011-07-09 DIAGNOSIS — R198 Other specified symptoms and signs involving the digestive system and abdomen: Secondary | ICD-10-CM

## 2011-07-09 DIAGNOSIS — Z932 Ileostomy status: Secondary | ICD-10-CM

## 2011-07-09 DIAGNOSIS — C20 Malignant neoplasm of rectum: Secondary | ICD-10-CM

## 2011-07-09 DIAGNOSIS — Z5111 Encounter for antineoplastic chemotherapy: Secondary | ICD-10-CM

## 2011-07-09 DIAGNOSIS — I82409 Acute embolism and thrombosis of unspecified deep veins of unspecified lower extremity: Secondary | ICD-10-CM

## 2011-07-09 DIAGNOSIS — E860 Dehydration: Secondary | ICD-10-CM

## 2011-07-09 LAB — PROTIME-INR
INR: 2.57 — ABNORMAL HIGH (ref 0.00–1.49)
Prothrombin Time: 28 seconds — ABNORMAL HIGH (ref 11.6–15.2)

## 2011-07-09 MED ORDER — SODIUM CHLORIDE 0.9 % IJ SOLN
10.0000 mL | INTRAMUSCULAR | Status: DC | PRN
  Administered 2011-07-09: 10 mL via INTRAVENOUS
  Filled 2011-07-09: qty 10

## 2011-07-09 MED ORDER — SODIUM CHLORIDE 0.9 % IV SOLN
INTRAVENOUS | Status: DC
  Administered 2011-07-09: 09:00:00 via INTRAVENOUS

## 2011-07-09 MED ORDER — HEPARIN SOD (PORK) LOCK FLUSH 100 UNIT/ML IV SOLN
500.0000 [IU] | Freq: Once | INTRAVENOUS | Status: AC
  Administered 2011-07-09: 500 [IU] via INTRAVENOUS
  Filled 2011-07-09: qty 5

## 2011-07-09 MED ORDER — HEPARIN SOD (PORK) LOCK FLUSH 100 UNIT/ML IV SOLN
INTRAVENOUS | Status: AC
  Administered 2011-07-09: 500 [IU] via INTRAVENOUS
  Filled 2011-07-09: qty 5

## 2011-07-09 MED ORDER — SODIUM CHLORIDE 0.9 % IJ SOLN
INTRAMUSCULAR | Status: AC
  Administered 2011-07-09: 10 mL via INTRAVENOUS
  Filled 2011-07-09: qty 10

## 2011-07-09 NOTE — Progress Notes (Signed)
Tolerated fluids well.  Refill on potassium 20 mEq #60 1 po bid called to [facility] in [place] at pts request.

## 2011-07-11 ENCOUNTER — Encounter (HOSPITAL_BASED_OUTPATIENT_CLINIC_OR_DEPARTMENT_OTHER): Payer: Medicaid Other

## 2011-07-11 ENCOUNTER — Encounter (HOSPITAL_BASED_OUTPATIENT_CLINIC_OR_DEPARTMENT_OTHER): Payer: Medicaid Other | Admitting: Oncology

## 2011-07-11 ENCOUNTER — Encounter (HOSPITAL_COMMUNITY): Payer: Medicaid Other

## 2011-07-11 DIAGNOSIS — C189 Malignant neoplasm of colon, unspecified: Secondary | ICD-10-CM

## 2011-07-11 DIAGNOSIS — R198 Other specified symptoms and signs involving the digestive system and abdomen: Secondary | ICD-10-CM

## 2011-07-11 DIAGNOSIS — C20 Malignant neoplasm of rectum: Secondary | ICD-10-CM

## 2011-07-11 DIAGNOSIS — E860 Dehydration: Secondary | ICD-10-CM

## 2011-07-11 DIAGNOSIS — I82409 Acute embolism and thrombosis of unspecified deep veins of unspecified lower extremity: Secondary | ICD-10-CM

## 2011-07-11 DIAGNOSIS — Z932 Ileostomy status: Secondary | ICD-10-CM

## 2011-07-11 DIAGNOSIS — I824Y9 Acute embolism and thrombosis of unspecified deep veins of unspecified proximal lower extremity: Secondary | ICD-10-CM

## 2011-07-11 DIAGNOSIS — Z5111 Encounter for antineoplastic chemotherapy: Secondary | ICD-10-CM

## 2011-07-11 LAB — CBC
HCT: 32 % — ABNORMAL LOW (ref 39.0–52.0)
Hemoglobin: 10.7 g/dL — ABNORMAL LOW (ref 13.0–17.0)
MCH: 27.4 pg (ref 26.0–34.0)
MCHC: 33.4 g/dL (ref 30.0–36.0)
MCV: 81.8 fL (ref 78.0–100.0)
Platelets: 231 10*3/uL (ref 150–400)
RBC: 3.91 MIL/uL — ABNORMAL LOW (ref 4.22–5.81)
RDW: 21 % — ABNORMAL HIGH (ref 11.5–15.5)
WBC: 5.7 10*3/uL (ref 4.0–10.5)

## 2011-07-11 LAB — COMPREHENSIVE METABOLIC PANEL
ALT: 15 U/L (ref 0–53)
AST: 19 U/L (ref 0–37)
Albumin: 3 g/dL — ABNORMAL LOW (ref 3.5–5.2)
Alkaline Phosphatase: 60 U/L (ref 39–117)
BUN: 6 mg/dL (ref 6–23)
CO2: 18 mEq/L — ABNORMAL LOW (ref 19–32)
Calcium: 9.2 mg/dL (ref 8.4–10.5)
Chloride: 104 mEq/L (ref 96–112)
Creatinine, Ser: 0.68 mg/dL (ref 0.50–1.35)
GFR calc Af Amer: >90 mL/min (ref >90)
GFR calc non Af Amer: >90 mL/min (ref >90)
Glucose, Bld: 106 mg/dL — ABNORMAL HIGH (ref 70–99)
Potassium: 3.5 mEq/L (ref 3.5–5.1)
Sodium: 134 mEq/L — ABNORMAL LOW (ref 135–145)
Total Bilirubin: 1.1 mg/dL (ref 0.3–1.2)
Total Protein: 6.2 g/dL (ref 6.0–8.3)

## 2011-07-11 LAB — DIFFERENTIAL
Basophils Absolute: 0 10*3/uL (ref 0.0–0.1)
Basophils Relative: 0 % (ref 0–1)
Eosinophils Absolute: 0.1 10*3/uL (ref 0.0–0.7)
Eosinophils Relative: 1 % (ref 0–5)
Lymphocytes Relative: 26 % (ref 12–46)
Lymphs Abs: 1.5 10*3/uL (ref 0.7–4.0)
Monocytes Absolute: 0.7 10*3/uL (ref 0.1–1.0)
Monocytes Relative: 12 % (ref 3–12)
Neutro Abs: 3.4 10*3/uL (ref 1.7–7.7)
Neutrophils Relative %: 60 % (ref 43–77)

## 2011-07-11 LAB — PROTIME-INR
INR: 2.62 — ABNORMAL HIGH (ref 0.00–1.49)
Prothrombin Time: 28.4 seconds — ABNORMAL HIGH (ref 11.6–15.2)

## 2011-07-11 MED ORDER — SODIUM CHLORIDE 0.9 % IV SOLN
INTRAVENOUS | Status: DC
  Administered 2011-07-11: 1000 mL via INTRAVENOUS

## 2011-07-11 MED ORDER — SODIUM CHLORIDE 0.9 % IV SOLN
Freq: Once | INTRAVENOUS | Status: AC
  Administered 2011-07-11: 09:00:00 via INTRAVENOUS

## 2011-07-11 MED ORDER — HEPARIN SOD (PORK) LOCK FLUSH 100 UNIT/ML IV SOLN
INTRAVENOUS | Status: AC
  Filled 2011-07-11: qty 5

## 2011-07-11 NOTE — Progress Notes (Signed)
Tolerated well

## 2011-07-11 NOTE — Patient Instructions (Signed)
SINA LUCCHESI  [id number] [birth year]    [facility]  Discharge Instructions  RECOMMENDATIONS MADE BY THE CONSULTANT AND ANY TEST RESULTS WILL BE SENT TO YOUR REFERRING DOCTOR.   EXAM FINDINGS BY MD TODAY AND SIGNS AND SYMPTOMS TO REPORT TO CLINIC OR PRIMARY MD: You look good.  Continue your coumadin 4 mg each evening until next PT/INR on Monday when you come for chemotherapy.  Try taking the cholestyramine along with the metamucil twice daily to see if your stool will thicken up.  MEDICATIONS PRESCRIBED: Coumadin 4 mg each afternoon.  Rx called into [facility] in [place].   INSTRUCTIONS GIVEN AND DISCUSSED: Other:  Report uncontrolled, nausea or vomiting, fevers, chills, etc.  SPECIAL INSTRUCTIONS/FOLLOW-UP: Return to Clinic on Monday for chemotherapy and to see MD in 6 weeks.     I acknowledge that I have been informed and understand all the instructions given to me and received a copy. I do not have any more questions at this time, but understand that I may call the Specialty Clinic at [facility] at [phone] during business hours should I have any further questions or need assistance in obtaining follow-up care.    __________________________________________  _____________  __________ Signature of Patient or Authorized Representative            Date                   Time    __________________________________________ Nurse's Signature

## 2011-07-11 NOTE — Progress Notes (Signed)
CC:   Doreen Beam, MD Rae Halsted, MD  DIAGNOSES: 1. Cancer of the rectum, status post resection with placement of a     colostomy by Dr. Epifania Gore at [facility]     on 11/14/2010.  He had stage III disease.  He returned to [place] for     adjuvant chemotherapy consisting of FOLFOX after completion of     radiation and 5-FU continuous infusion in May 7 through October 10, 2010, prior to surgery. 2. Deep vein thrombosis of the leg with a huge clot from the inguinal     ligament down to the popliteal vein diagnosed at [facility]     at the end of 2012.  He was admitted on New Years Day with right     lower extremity pain.  He is now on Coumadin with nice resolution     of swelling of the legs. 3. High-output colostomy requiring fluids 3 times a week to maintain     his sense of well-being.  It has not responded to Metamucil.  It     has not responded to Imodium well.  It has not responded to     cholestyramine. This very pleasant gentleman, though, had lost a tremendous amount weight and since we have started giving him fluids 3 times a week on a prophylactic regimen, he has been able to gain a significant amount of weight.  He went as low as 169 to 170 pounds.  He is now up to 183 pounds just since starting this hydration regimen, which we have done now for about 8 weeks or so.  He was not able to be maintained at home with just oral intake.  Today he looks really quite good.  He has 1 more dose to complete 6 full cycles of FOLFOX therapy.  That will be done Monday.  His blood counts are very, very nice today.  His BUN and creatinine are normal.  His electrolytes are normal except for his CO2 is still slightly low at 18. His albumin is slightly low at 3.0 but he states he is eating better. His hemoglobin is 10.7, white count and platelets are normal. Differential is unremarkable.  His INR is 2.62 today.  He is on 4 mg of Coumadin, which we will leave him  on until Monday.  Other vital signs show that his blood pressure is 135/90 today, pulse 88 to -92 and regular.  Respirations 16 to 18 and unlabored.  He is afebrile and denies any pain presently.  He is in no acute distress. His lungs are clear.  Heart shows this regular rhythm and rate.  Port-A- Cath is intact of the right upper chest wall.  Abdomen:  Soft.  Bowel sounds are very active, though, and in his colostomy is just dark liquid which is typically all that he has.  What I have asked him to try is a combination of Metamucil and cholestyramine together, 2 doses each day of each drug.  We will try that over the weekend and we will see how he is on Monday.  I do not hold a lot of hope for that regimen since nothing else has worked thus far.  He still use Imodium at times, but that slows him down only minimally.  He has felt so much better, he states, since being hydrated.  He will probably need, of course, Lovenox coverage prior to surgery, but his wife will make an  appointment with Dr. Valarie Merino office for the next couple of weeks so that they can schedule him for a reversal of this colostomy.    ______________________________ Ladona Horns. Mariel Sleet, MD ESN/MEDQ  D:  07/11/2011  T:  07/11/2011  Job:  [id number]

## 2011-07-11 NOTE — Progress Notes (Signed)
This office note has been dictated.

## 2011-07-14 ENCOUNTER — Telehealth (HOSPITAL_COMMUNITY): Payer: Self-pay

## 2011-07-14 ENCOUNTER — Encounter (HOSPITAL_BASED_OUTPATIENT_CLINIC_OR_DEPARTMENT_OTHER): Payer: Medicaid Other

## 2011-07-14 DIAGNOSIS — C189 Malignant neoplasm of colon, unspecified: Secondary | ICD-10-CM

## 2011-07-14 DIAGNOSIS — Z932 Ileostomy status: Secondary | ICD-10-CM

## 2011-07-14 DIAGNOSIS — C20 Malignant neoplasm of rectum: Secondary | ICD-10-CM

## 2011-07-14 DIAGNOSIS — I82409 Acute embolism and thrombosis of unspecified deep veins of unspecified lower extremity: Secondary | ICD-10-CM

## 2011-07-14 DIAGNOSIS — E860 Dehydration: Secondary | ICD-10-CM

## 2011-07-14 DIAGNOSIS — Z5111 Encounter for antineoplastic chemotherapy: Secondary | ICD-10-CM

## 2011-07-14 DIAGNOSIS — R198 Other specified symptoms and signs involving the digestive system and abdomen: Secondary | ICD-10-CM

## 2011-07-14 LAB — PROTIME-INR
INR: 2.1 — ABNORMAL HIGH (ref 0.00–1.49)
Prothrombin Time: 23.9 seconds — ABNORMAL HIGH (ref 11.6–15.2)

## 2011-07-14 MED ORDER — SODIUM CHLORIDE 0.9 % IV SOLN
Freq: Once | INTRAVENOUS | Status: AC
  Administered 2011-07-14: 8 mg via INTRAVENOUS
  Filled 2011-07-14: qty 4

## 2011-07-14 MED ORDER — FLUOROURACIL CHEMO INJECTION 2.5 GM/50ML
700.0000 mg | Freq: Once | INTRAVENOUS | Status: AC
  Administered 2011-07-14: 700 mg via INTRAVENOUS
  Filled 2011-07-14: qty 14

## 2011-07-14 MED ORDER — LEUCOVORIN CALCIUM INJECTION 100 MG
42.0000 mg | Freq: Once | INTRAMUSCULAR | Status: AC
  Administered 2011-07-14: 42 mg via INTRAVENOUS
  Filled 2011-07-14: qty 2.1

## 2011-07-14 MED ORDER — SODIUM CHLORIDE 0.9 % IV SOLN: 8.0000 mg | Freq: Once | INTRAVENOUS | Status: DC

## 2011-07-14 MED ORDER — SODIUM CHLORIDE 0.9 % IV SOLN
3850.0000 mg | INTRAVENOUS | Status: DC
  Administered 2011-07-14: 3850 mg via INTRAVENOUS
  Filled 2011-07-14 (×2): qty 77

## 2011-07-14 MED ORDER — DEXTROSE 5 % IV SOLN
50.0000 mL | INTRAVENOUS | Status: AC
  Administered 2011-07-14: 50 mL via INTRAVENOUS
  Filled 2011-07-14 (×2): qty 50

## 2011-07-14 MED ORDER — SODIUM CHLORIDE 0.9 % IJ SOLN
10.0000 mL | INTRAMUSCULAR | Status: DC | PRN
  Administered 2011-07-14: 10 mL
  Filled 2011-07-14: qty 10

## 2011-07-14 MED ORDER — DEXAMETHASONE SODIUM PHOSPHATE 10 MG/ML IJ SOLN: 10.0000 mg | Freq: Once | INTRAMUSCULAR | Status: DC

## 2011-07-14 MED ORDER — OXALIPLATIN CHEMO INJECTION 100 MG/20ML
119.0000 mg | Freq: Once | INTRAVENOUS | Status: AC
  Administered 2011-07-14: 120 mg via INTRAVENOUS
  Filled 2011-07-14: qty 24

## 2011-07-14 MED ORDER — SODIUM CHLORIDE 0.9 % IV SOLN
INTRAVENOUS | Status: DC
Start: 2011-07-14 — End: 2011-07-14
  Administered 2011-07-14: 1000 mL via INTRAVENOUS

## 2011-07-14 MED ORDER — LEUCOVORIN CALCIUM INJECTION 350 MG: 660.0000 mg | Freq: Once | INTRAVENOUS | Status: DC

## 2011-07-14 NOTE — Progress Notes (Signed)
Addended by: Sterling Big on: 07/14/2011 05:29 PM   Modules accepted: Orders

## 2011-07-14 NOTE — Telephone Encounter (Signed)
Message copied by Sterling Big on Mon Jul 14, 2011  5:16 PM ------      Message from: Mariel Sleet, ERIC S      Created: Mon Jul 14, 2011  4:08 PM       Same dose coumadin-INR 7 days.

## 2011-07-14 NOTE — Progress Notes (Signed)
Tolerated well Coumadin dose 4 mg daily

## 2011-07-16 ENCOUNTER — Encounter (HOSPITAL_BASED_OUTPATIENT_CLINIC_OR_DEPARTMENT_OTHER): Payer: Medicaid Other

## 2011-07-16 VITALS — BP 128/84 | HR 104 | Temp 97.5°F

## 2011-07-16 DIAGNOSIS — E860 Dehydration: Secondary | ICD-10-CM

## 2011-07-16 DIAGNOSIS — R198 Other specified symptoms and signs involving the digestive system and abdomen: Secondary | ICD-10-CM

## 2011-07-16 DIAGNOSIS — Z5111 Encounter for antineoplastic chemotherapy: Secondary | ICD-10-CM

## 2011-07-16 DIAGNOSIS — C20 Malignant neoplasm of rectum: Secondary | ICD-10-CM

## 2011-07-16 DIAGNOSIS — Z932 Ileostomy status: Secondary | ICD-10-CM

## 2011-07-16 MED ORDER — SODIUM CHLORIDE 0.9 % IJ SOLN
10.0000 mL | INTRAMUSCULAR | Status: DC | PRN
  Administered 2011-07-16: 10 mL via INTRAVENOUS
  Filled 2011-07-16: qty 10

## 2011-07-16 MED ORDER — HEPARIN SOD (PORK) LOCK FLUSH 100 UNIT/ML IV SOLN
INTRAVENOUS | Status: AC
  Administered 2011-07-16: 500 [IU] via INTRAVENOUS
  Filled 2011-07-16: qty 5

## 2011-07-16 MED ORDER — SODIUM CHLORIDE 0.9 % IV SOLN
INTRAVENOUS | Status: DC
  Administered 2011-07-16: 2000 mL via INTRAVENOUS

## 2011-07-16 MED ORDER — SODIUM CHLORIDE 0.9 % IV SOLN: INTRAVENOUS | Status: DC

## 2011-07-16 MED ORDER — HEPARIN SOD (PORK) LOCK FLUSH 100 UNIT/ML IV SOLN
500.0000 [IU] | Freq: Once | INTRAVENOUS | Status: AC
  Administered 2011-07-16: 500 [IU] via INTRAVENOUS
  Filled 2011-07-16: qty 5

## 2011-07-16 NOTE — Progress Notes (Signed)
Tolerated fluids well. 1fu continuous infusion pump d/c.

## 2011-07-18 ENCOUNTER — Encounter (HOSPITAL_BASED_OUTPATIENT_CLINIC_OR_DEPARTMENT_OTHER): Payer: Medicaid Other

## 2011-07-18 VITALS — BP 133/86 | HR 121 | Temp 97.6°F

## 2011-07-18 DIAGNOSIS — R198 Other specified symptoms and signs involving the digestive system and abdomen: Secondary | ICD-10-CM

## 2011-07-18 DIAGNOSIS — Z932 Ileostomy status: Secondary | ICD-10-CM

## 2011-07-18 DIAGNOSIS — C20 Malignant neoplasm of rectum: Secondary | ICD-10-CM

## 2011-07-18 DIAGNOSIS — E860 Dehydration: Secondary | ICD-10-CM

## 2011-07-18 DIAGNOSIS — Z5111 Encounter for antineoplastic chemotherapy: Secondary | ICD-10-CM

## 2011-07-18 MED ORDER — HEPARIN SOD (PORK) LOCK FLUSH 100 UNIT/ML IV SOLN
INTRAVENOUS | Status: AC
  Filled 2011-07-18: qty 5

## 2011-07-18 MED ORDER — HEPARIN SOD (PORK) LOCK FLUSH 100 UNIT/ML IV SOLN
500.0000 [IU] | Freq: Once | INTRAVENOUS | Status: AC
  Administered 2011-07-18: 500 [IU] via INTRAVENOUS
  Filled 2011-07-18: qty 5

## 2011-07-18 MED ORDER — SODIUM CHLORIDE 0.9 % IV SOLN
INTRAVENOUS | Status: DC
  Administered 2011-07-18: 2000 mL via INTRAVENOUS

## 2011-07-18 NOTE — Progress Notes (Signed)
Tolerated fluids infusion well.

## 2011-07-21 ENCOUNTER — Encounter (HOSPITAL_BASED_OUTPATIENT_CLINIC_OR_DEPARTMENT_OTHER): Payer: Medicaid Other

## 2011-07-21 VITALS — BP 121/80 | HR 105 | Temp 97.9°F

## 2011-07-21 DIAGNOSIS — Z5111 Encounter for antineoplastic chemotherapy: Secondary | ICD-10-CM

## 2011-07-21 DIAGNOSIS — I82409 Acute embolism and thrombosis of unspecified deep veins of unspecified lower extremity: Secondary | ICD-10-CM

## 2011-07-21 DIAGNOSIS — E860 Dehydration: Secondary | ICD-10-CM

## 2011-07-21 DIAGNOSIS — Z932 Ileostomy status: Secondary | ICD-10-CM

## 2011-07-21 DIAGNOSIS — R198 Other specified symptoms and signs involving the digestive system and abdomen: Secondary | ICD-10-CM

## 2011-07-21 DIAGNOSIS — C20 Malignant neoplasm of rectum: Secondary | ICD-10-CM

## 2011-07-21 LAB — PROTIME-INR
INR: 1.38 (ref 0.00–1.49)
Prothrombin Time: 17.2 seconds — ABNORMAL HIGH (ref 11.6–15.2)

## 2011-07-21 MED ORDER — HEPARIN SOD (PORK) LOCK FLUSH 100 UNIT/ML IV SOLN
INTRAVENOUS | Status: AC
  Administered 2011-07-21: 500 [IU] via INTRAVENOUS
  Filled 2011-07-21: qty 5

## 2011-07-21 MED ORDER — SODIUM CHLORIDE 0.9 % IV SOLN
INTRAVENOUS | Status: DC
  Administered 2011-07-21: 10:00:00 via INTRAVENOUS

## 2011-07-21 MED ORDER — HEPARIN SOD (PORK) LOCK FLUSH 100 UNIT/ML IV SOLN
500.0000 [IU] | Freq: Once | INTRAVENOUS | Status: AC
  Administered 2011-07-21: 500 [IU] via INTRAVENOUS
  Filled 2011-07-21: qty 5

## 2011-07-22 ENCOUNTER — Telehealth (HOSPITAL_COMMUNITY): Payer: Self-pay | Admitting: *Deleted

## 2011-07-22 NOTE — Telephone Encounter (Signed)
Steward Drone called to report that Banyan will be stopping coumadin on 3/23 and will be at Dr. Valarie Merino for labs, tests, etc in prep for surgery on 3/26. She states Dr. Epifania Gore told them he did not need lovenox due to placement of filter.

## 2011-07-23 ENCOUNTER — Telehealth (HOSPITAL_COMMUNITY): Payer: Self-pay | Admitting: *Deleted

## 2011-07-23 ENCOUNTER — Ambulatory Visit (HOSPITAL_COMMUNITY): Payer: Medicaid Other

## 2011-07-23 NOTE — Telephone Encounter (Signed)
Steward Drone notified to stop pt's coumadin now. Start lovenox 82 mg every 12 hrs (0.55 cc) tonight and give last dose on Tuesday am. Verbalized understanding. States Nitin may need fluids by Friday. Instructed her to call us by Thursday to let us know.

## 2011-07-25 ENCOUNTER — Encounter (HOSPITAL_BASED_OUTPATIENT_CLINIC_OR_DEPARTMENT_OTHER): Payer: Medicaid Other

## 2011-07-25 VITALS — BP 118/77 | HR 88 | Temp 97.0°F

## 2011-07-25 DIAGNOSIS — C189 Malignant neoplasm of colon, unspecified: Secondary | ICD-10-CM

## 2011-07-25 DIAGNOSIS — Z5111 Encounter for antineoplastic chemotherapy: Secondary | ICD-10-CM

## 2011-07-25 DIAGNOSIS — R198 Other specified symptoms and signs involving the digestive system and abdomen: Secondary | ICD-10-CM

## 2011-07-25 DIAGNOSIS — C20 Malignant neoplasm of rectum: Secondary | ICD-10-CM

## 2011-07-25 DIAGNOSIS — E860 Dehydration: Secondary | ICD-10-CM

## 2011-07-25 DIAGNOSIS — Z932 Ileostomy status: Secondary | ICD-10-CM

## 2011-07-25 MED ORDER — SODIUM CHLORIDE 0.9 % IV SOLN
INTRAVENOUS | Status: DC
  Administered 2011-07-25: 1000 mL via INTRAVENOUS

## 2011-07-25 MED ORDER — HEPARIN SOD (PORK) LOCK FLUSH 100 UNIT/ML IV SOLN
INTRAVENOUS | Status: AC
  Administered 2011-07-25: 500 [IU] via INTRAVENOUS
  Filled 2011-07-25: qty 5

## 2011-07-25 MED ORDER — SODIUM CHLORIDE 0.9 % IV SOLN
Freq: Once | INTRAVENOUS | Status: AC
  Administered 2011-07-25: 1000 mL via INTRAVENOUS

## 2011-07-25 NOTE — Progress Notes (Signed)
Charles [name] tolerated infusions well and without incident; verbalizes understanding for follow-up.  No distress noted at time of discharge and patient was discharged home with his wife.

## 2011-07-28 ENCOUNTER — Encounter (HOSPITAL_BASED_OUTPATIENT_CLINIC_OR_DEPARTMENT_OTHER): Payer: Medicaid Other

## 2011-07-28 DIAGNOSIS — Z932 Ileostomy status: Secondary | ICD-10-CM

## 2011-07-28 DIAGNOSIS — C20 Malignant neoplasm of rectum: Secondary | ICD-10-CM

## 2011-07-28 DIAGNOSIS — E860 Dehydration: Secondary | ICD-10-CM

## 2011-07-28 DIAGNOSIS — R198 Other specified symptoms and signs involving the digestive system and abdomen: Secondary | ICD-10-CM

## 2011-07-28 DIAGNOSIS — Z5111 Encounter for antineoplastic chemotherapy: Secondary | ICD-10-CM

## 2011-07-28 MED ORDER — SODIUM CHLORIDE 0.9 % IV SOLN
INTRAVENOUS | Status: DC
  Administered 2011-07-28: 2000 mL via INTRAVENOUS

## 2011-07-28 MED ORDER — SODIUM CHLORIDE 0.9 % IJ SOLN
10.0000 mL | INTRAMUSCULAR | Status: DC | PRN
  Administered 2011-07-28: 10 mL via INTRAVENOUS
  Filled 2011-07-28: qty 10

## 2011-07-28 MED ORDER — HEPARIN SOD (PORK) LOCK FLUSH 100 UNIT/ML IV SOLN
500.0000 [IU] | Freq: Once | INTRAVENOUS | Status: AC
  Administered 2011-07-28: 500 [IU] via INTRAVENOUS
  Filled 2011-07-28: qty 5

## 2011-07-28 MED ORDER — HEPARIN SOD (PORK) LOCK FLUSH 100 UNIT/ML IV SOLN
INTRAVENOUS | Status: AC
  Filled 2011-07-28: qty 5

## 2011-07-29 ENCOUNTER — Other Ambulatory Visit (HOSPITAL_COMMUNITY): Payer: Medicaid Other

## 2011-07-30 ENCOUNTER — Encounter (HOSPITAL_BASED_OUTPATIENT_CLINIC_OR_DEPARTMENT_OTHER): Payer: Medicaid Other

## 2011-07-30 VITALS — BP 136/95 | HR 125 | Temp 97.2°F

## 2011-07-30 DIAGNOSIS — R198 Other specified symptoms and signs involving the digestive system and abdomen: Secondary | ICD-10-CM

## 2011-07-30 DIAGNOSIS — E860 Dehydration: Secondary | ICD-10-CM

## 2011-07-30 DIAGNOSIS — C20 Malignant neoplasm of rectum: Secondary | ICD-10-CM

## 2011-07-30 DIAGNOSIS — Z932 Ileostomy status: Secondary | ICD-10-CM

## 2011-07-30 DIAGNOSIS — Z5111 Encounter for antineoplastic chemotherapy: Secondary | ICD-10-CM

## 2011-07-30 MED ORDER — HEPARIN SOD (PORK) LOCK FLUSH 100 UNIT/ML IV SOLN
500.0000 [IU] | Freq: Once | INTRAVENOUS | Status: AC
  Administered 2011-07-30: 500 [IU] via INTRAVENOUS
  Filled 2011-07-30: qty 5

## 2011-07-30 MED ORDER — HEPARIN SOD (PORK) LOCK FLUSH 100 UNIT/ML IV SOLN
INTRAVENOUS | Status: AC
  Filled 2011-07-30: qty 5

## 2011-07-30 MED ORDER — SODIUM CHLORIDE 0.9 % IV SOLN
INTRAVENOUS | Status: DC
  Administered 2011-07-30: 2000 mL via INTRAVENOUS

## 2011-08-04 HISTORY — PX: OTHER SURGICAL HISTORY: SHX169

## 2011-08-22 ENCOUNTER — Telehealth (HOSPITAL_COMMUNITY): Payer: Self-pay | Admitting: *Deleted

## 2011-08-22 ENCOUNTER — Other Ambulatory Visit (HOSPITAL_COMMUNITY): Payer: Self-pay | Admitting: *Deleted

## 2011-08-22 ENCOUNTER — Encounter (HOSPITAL_COMMUNITY): Payer: Self-pay | Admitting: Oncology

## 2011-08-22 ENCOUNTER — Encounter (HOSPITAL_COMMUNITY): Payer: Medicaid Other

## 2011-08-22 ENCOUNTER — Encounter (HOSPITAL_COMMUNITY): Payer: Medicaid Other | Attending: Hematology and Oncology | Admitting: Oncology

## 2011-08-22 VITALS — BP 129/87 | HR 118 | Temp 97.1°F | Wt 179.3 lb

## 2011-08-22 DIAGNOSIS — E876 Hypokalemia: Secondary | ICD-10-CM

## 2011-08-22 DIAGNOSIS — G629 Polyneuropathy, unspecified: Secondary | ICD-10-CM

## 2011-08-22 DIAGNOSIS — C20 Malignant neoplasm of rectum: Secondary | ICD-10-CM

## 2011-08-22 DIAGNOSIS — I82409 Acute embolism and thrombosis of unspecified deep veins of unspecified lower extremity: Secondary | ICD-10-CM | POA: Insufficient documentation

## 2011-08-22 DIAGNOSIS — G609 Hereditary and idiopathic neuropathy, unspecified: Secondary | ICD-10-CM | POA: Insufficient documentation

## 2011-08-22 DIAGNOSIS — C189 Malignant neoplasm of colon, unspecified: Secondary | ICD-10-CM

## 2011-08-22 LAB — BASIC METABOLIC PANEL
BUN: 6 mg/dL (ref 6–23)
CO2: 23 mEq/L (ref 19–32)
Calcium: 9.2 mg/dL (ref 8.4–10.5)
Chloride: 105 mEq/L (ref 96–112)
Creatinine, Ser: 0.57 mg/dL (ref 0.50–1.35)
GFR calc Af Amer: >90 mL/min (ref >90)
GFR calc non Af Amer: >90 mL/min (ref >90)
Glucose, Bld: 178 mg/dL — ABNORMAL HIGH (ref 70–99)
Potassium: 2.7 mEq/L — CL (ref 3.5–5.1)
Sodium: 138 mEq/L (ref 135–145)

## 2011-08-22 LAB — CBC
HCT: 32.9 % — ABNORMAL LOW (ref 39.0–52.0)
Hemoglobin: 10.7 g/dL — ABNORMAL LOW (ref 13.0–17.0)
MCH: 27.3 pg (ref 26.0–34.0)
MCHC: 32.5 g/dL (ref 30.0–36.0)
MCV: 83.9 fL (ref 78.0–100.0)
Platelets: 461 10*3/uL — ABNORMAL HIGH (ref 150–400)
RBC: 3.92 MIL/uL — ABNORMAL LOW (ref 4.22–5.81)
RDW: 16.1 % — ABNORMAL HIGH (ref 11.5–15.5)
WBC: 7.1 10*3/uL (ref 4.0–10.5)

## 2011-08-22 LAB — MAGNESIUM: Magnesium: 1.5 mg/dL (ref 1.5–2.5)

## 2011-08-22 LAB — PROTIME-INR
INR: 1.8 — ABNORMAL HIGH (ref 0.00–1.49)
Prothrombin Time: 21.2 seconds — ABNORMAL HIGH (ref 11.6–15.2)

## 2011-08-22 LAB — CEA: CEA: 1 ng/mL (ref 0.0–5.0)

## 2011-08-22 MED ORDER — HEPARIN SOD (PORK) LOCK FLUSH 100 UNIT/ML IV SOLN
500.0000 [IU] | Freq: Once | INTRAVENOUS | Status: DC
  Filled 2011-08-22: qty 5

## 2011-08-22 MED ORDER — SODIUM CHLORIDE 0.9 % IJ SOLN
10.0000 mL | INTRAMUSCULAR | Status: DC | PRN
  Filled 2011-08-22: qty 10

## 2011-08-22 MED ORDER — GABAPENTIN 300 MG PO CAPS: 600.0000 mg | ORAL_CAPSULE | Freq: Every day | ORAL | Status: DC

## 2011-08-22 MED ORDER — HEPARIN SOD (PORK) LOCK FLUSH 100 UNIT/ML IV SOLN
500.0000 [IU] | Freq: Once | INTRAVENOUS | Status: AC
  Administered 2011-08-22: 500 [IU] via INTRAVENOUS
  Filled 2011-08-22: qty 5

## 2011-08-22 MED ORDER — HEPARIN SOD (PORK) LOCK FLUSH 100 UNIT/ML IV SOLN
INTRAVENOUS | Status: AC
  Filled 2011-08-22: qty 5

## 2011-08-22 MED ORDER — SODIUM CHLORIDE 0.9 % IJ SOLN
10.0000 mL | INTRAMUSCULAR | Status: AC | PRN
  Administered 2011-08-22: 10 mL via INTRAVENOUS
  Filled 2011-08-22: qty 10

## 2011-08-22 MED ORDER — SODIUM CHLORIDE 0.9 % IJ SOLN
INTRAMUSCULAR | Status: AC
  Filled 2011-08-22: qty 10

## 2011-08-22 NOTE — Progress Notes (Signed)
Pt notified to take coumadin 7.5 mg tonight then 5 mg daily. K dur 20 meq take 2 twice a day repeat pt inr and b met on Tues 4/23.

## 2011-08-22 NOTE — Progress Notes (Signed)
This office note has been dictated.

## 2011-08-22 NOTE — Progress Notes (Signed)
Labs drawn

## 2011-08-22 NOTE — Progress Notes (Signed)
CC:   Doreen Beam, MD Rae Halsted, MD  DIAGNOSES: 1. Stage III cancer of the rectum status post resection with placement     of a colostomy by Dr. Sondra Come of which has been recently rumor     reversed at [facility] within the last month.  He took     adjuvant chemotherapy consisting of FOLFOX after completion of     radiation and 5-FU by continuous infusion from May 7 through October 10, 2010 prior to his surgery. 2. Deep venous thrombosis of the left leg with a huge clot from the     inguinal ligament down to the popliteal vein diagnosed to [facility] essentially May 06, 2011.  He is still on Coumadin     which I think he should be on for 6 months to 12 months. 3. High-output colostomy requiring fluids 3 times a week during his     chemotherapy. 4. Hypomagnesemia while at [facility]. 5. Hypokalemia with a potassium of 2.7 today and we are going to     increase K-Dur to 20 mEq 2 b.i.d. until we see what a B-MET is on     Tuesday of next week and an INR will be checked again since he is     subtherapeutic today. His INRs supposedly is 3.6, he states the other day at Dr. Sherril Croon' and his Coumadin was changed to 5 mg daily except Mondays and Thursdays when it was 2.5 mg.  He is already subtherapeutic now with an INR 1.8.  So he is going to take 7.5 mg today.  We will check him on Tuesday with a daily intake to 5 mg for right now.  He feels great.  He does not have any shortness of breath, chest pain. No ankle swelling.  Vital signs today are very stable.  He looks good and feels good but we will get him feeling better if we can replace his potassium, get his INR squared away.  Tentatively we will put him down to see [place] in 12 weeks.  He needs a CEA, of course, every 12 weeks I think going forward, probably CT scans once a year for 2 or 3 years.  We will see him back as mentioned above.    ______________________________ Ladona Horns. Mariel Sleet, MD ESN/MEDQ  D:  08/22/2011   T:  08/22/2011  Job:  [id number]

## 2011-08-22 NOTE — Patient Instructions (Signed)
[facility]  Discharge Instructions  RECOMMENDATIONS MADE BY THE CONSULTANT AND ANY TEST RESULTS WILL BE SENT TO YOUR REFERRING DOCTOR.   EXAM FINDINGS BY MD TODAY AND SIGNS AND SYMPTOMS TO REPORT TO CLINIC OR PRIMARY MD:  You look really good.  We will check labs today We need to flush your port every 6 weeks and we will check your cancer marker(blood work) every 12 weeks.   SPECIAL INSTRUCTIONS/FOLLOW-UP: appt with Tom in 3 months.   I acknowledge that I have been informed and understand all the instructions given to me and received a copy. I do not have any more questions at this time, but understand that I may call the Specialty Clinic at [facility] at [phone] during business hours should I have any further questions or need assistance in obtaining follow-up care.    __________________________________________  _____________  __________ Signature of Patient or Authorized Representative            Date                   Time    __________________________________________ Nurse's Signature

## 2011-08-22 NOTE — Telephone Encounter (Signed)
Potassium level 2.7 He is on 20 meq 2 times daily

## 2011-08-26 ENCOUNTER — Encounter (HOSPITAL_BASED_OUTPATIENT_CLINIC_OR_DEPARTMENT_OTHER): Payer: Medicaid Other

## 2011-08-26 ENCOUNTER — Other Ambulatory Visit (HOSPITAL_COMMUNITY): Payer: Self-pay

## 2011-08-26 ENCOUNTER — Other Ambulatory Visit (HOSPITAL_COMMUNITY): Payer: Self-pay | Admitting: Oncology

## 2011-08-26 ENCOUNTER — Telehealth (HOSPITAL_COMMUNITY): Payer: Self-pay | Admitting: *Deleted

## 2011-08-26 DIAGNOSIS — I82409 Acute embolism and thrombosis of unspecified deep veins of unspecified lower extremity: Secondary | ICD-10-CM

## 2011-08-26 DIAGNOSIS — R197 Diarrhea, unspecified: Secondary | ICD-10-CM

## 2011-08-26 LAB — BASIC METABOLIC PANEL
BUN: 10 mg/dL (ref 6–23)
CO2: 23 mEq/L (ref 19–32)
Calcium: 9.2 mg/dL (ref 8.4–10.5)
Chloride: 105 mEq/L (ref 96–112)
Creatinine, Ser: 0.69 mg/dL (ref 0.50–1.35)
GFR calc Af Amer: >90 mL/min (ref >90)
GFR calc non Af Amer: >90 mL/min (ref >90)
Glucose, Bld: 87 mg/dL (ref 70–99)
Potassium: 3.7 mEq/L (ref 3.5–5.1)
Sodium: 138 mEq/L (ref 135–145)

## 2011-08-26 LAB — PROTIME-INR
INR: 1.94 — ABNORMAL HIGH (ref 0.00–1.49)
Prothrombin Time: 22.5 seconds — ABNORMAL HIGH (ref 11.6–15.2)

## 2011-08-26 MED ORDER — WARFARIN SODIUM 1 MG PO TABS: 1.0000 mg | ORAL_TABLET | ORAL | Status: DC

## 2011-08-26 NOTE — Telephone Encounter (Signed)
Message copied by Dennie Maizes on Tue Aug 26, 2011  1:28 PM ------      Message from: Mariel Sleet, ERIC S      Created: Tue Aug 26, 2011 11:49 AM       Verify coumadin dose      K+ is much better continue same dose.

## 2011-08-26 NOTE — Progress Notes (Signed)
Labs drawn today for pt and bmp.  Patient on 5mg  of coud.  Call patient at [phone]

## 2011-09-02 ENCOUNTER — Other Ambulatory Visit (HOSPITAL_COMMUNITY): Payer: Medicaid Other

## 2011-09-03 ENCOUNTER — Other Ambulatory Visit (HOSPITAL_COMMUNITY): Payer: Self-pay | Admitting: Oncology

## 2011-09-03 ENCOUNTER — Encounter (HOSPITAL_COMMUNITY): Payer: Medicaid Other | Attending: Hematology and Oncology

## 2011-09-03 DIAGNOSIS — I82409 Acute embolism and thrombosis of unspecified deep veins of unspecified lower extremity: Secondary | ICD-10-CM

## 2011-09-03 DIAGNOSIS — G609 Hereditary and idiopathic neuropathy, unspecified: Secondary | ICD-10-CM | POA: Insufficient documentation

## 2011-09-03 DIAGNOSIS — C189 Malignant neoplasm of colon, unspecified: Secondary | ICD-10-CM | POA: Insufficient documentation

## 2011-09-03 LAB — CEA: CEA: 0.7 ng/mL (ref 0.0–5.0)

## 2011-09-03 LAB — PROTIME-INR
INR: 1.72 — ABNORMAL HIGH (ref 0.00–1.49)
Prothrombin Time: 20.5 seconds — ABNORMAL HIGH (ref 11.6–15.2)

## 2011-09-10 ENCOUNTER — Other Ambulatory Visit (HOSPITAL_COMMUNITY): Payer: Self-pay | Admitting: Oncology

## 2011-09-10 ENCOUNTER — Encounter (HOSPITAL_BASED_OUTPATIENT_CLINIC_OR_DEPARTMENT_OTHER): Payer: Medicaid Other

## 2011-09-10 DIAGNOSIS — I82409 Acute embolism and thrombosis of unspecified deep veins of unspecified lower extremity: Secondary | ICD-10-CM

## 2011-09-10 LAB — PROTIME-INR
INR: 1.86 — ABNORMAL HIGH (ref 0.00–1.49)
Prothrombin Time: 21.8 seconds — ABNORMAL HIGH (ref 11.6–15.2)

## 2011-09-10 NOTE — Progress Notes (Signed)
Labs drawn today for pt. Patient on 5 and 6mg  alternating.  Call patient at [phone]

## 2011-09-16 ENCOUNTER — Other Ambulatory Visit (HOSPITAL_COMMUNITY): Payer: Self-pay | Admitting: Oncology

## 2011-09-16 ENCOUNTER — Encounter (HOSPITAL_BASED_OUTPATIENT_CLINIC_OR_DEPARTMENT_OTHER): Payer: Medicaid Other

## 2011-09-16 DIAGNOSIS — I82409 Acute embolism and thrombosis of unspecified deep veins of unspecified lower extremity: Secondary | ICD-10-CM

## 2011-09-16 LAB — PROTIME-INR
INR: 2.14 — ABNORMAL HIGH (ref 0.00–1.49)
Prothrombin Time: 24.3 seconds — ABNORMAL HIGH (ref 11.6–15.2)

## 2011-09-17 ENCOUNTER — Ambulatory Visit (HOSPITAL_COMMUNITY)
Admission: RE | Admit: 2011-09-17 | Discharge: 2011-09-17 | Disposition: A | Discharge status: Home / Self Care | Payer: Medicaid Other | Source: Ambulatory Visit | Attending: Oncology | Admitting: Oncology

## 2011-09-17 ENCOUNTER — Encounter (HOSPITAL_BASED_OUTPATIENT_CLINIC_OR_DEPARTMENT_OTHER): Payer: Medicaid Other | Admitting: Oncology

## 2011-09-17 ENCOUNTER — Other Ambulatory Visit (HOSPITAL_COMMUNITY): Payer: Medicaid Other

## 2011-09-17 VITALS — BP 112/72 | HR 84 | Temp 97.3°F | Ht 71.0 in | Wt 195.7 lb

## 2011-09-17 DIAGNOSIS — I82409 Acute embolism and thrombosis of unspecified deep veins of unspecified lower extremity: Secondary | ICD-10-CM

## 2011-09-17 DIAGNOSIS — G609 Hereditary and idiopathic neuropathy, unspecified: Secondary | ICD-10-CM

## 2011-09-17 DIAGNOSIS — R609 Edema, unspecified: Secondary | ICD-10-CM

## 2011-09-17 DIAGNOSIS — G629 Polyneuropathy, unspecified: Secondary | ICD-10-CM

## 2011-09-17 DIAGNOSIS — M7989 Other specified soft tissue disorders: Secondary | ICD-10-CM | POA: Insufficient documentation

## 2011-09-17 DIAGNOSIS — I825Y9 Chronic embolism and thrombosis of unspecified deep veins of unspecified proximal lower extremity: Secondary | ICD-10-CM | POA: Insufficient documentation

## 2011-09-17 MED ORDER — GABAPENTIN 300 MG PO CAPS: ORAL_CAPSULE | ORAL | Status: DC

## 2011-09-17 NOTE — Progress Notes (Addendum)
Charles [name] is seen today as a work in. Yesterday afternoon, the patient reported the clinic that he had a right lower extremity swelling and pain. He has a history of a DVT in this extremity for which she is on Coumadin. He recently underwent reversal of his ileostomy on March 20. There was some controversy over anticoagulation surrounding the time of surgery. He was bridged with Lovenox and subsequently put on Coumadin following surgery.  Since surgery, he has not been properly therapeutic with his INR despite being perfectly therapeutic prior surgery with the dose of Coumadin of 5 mg alternating with 6 mg. So since  April 19, he has been subtherapeutic in the range of 1.72- 1.94. Most recently his dose was escalated to 6 mg daily, and most recently he's been therapeutic on 09/16/2011 at 2.14.  The patient reports approximately one week ago he noted some right lower extremity swelling. It is unilateral. He reports that it was uncomfortable with ambulation. This unfortunate the patient call the clinic yesterday we are able to work him in today. Physical exam should be noted that his right lower extremity is edematous. He does have 1+ pitting edema pretibially. It is erythematous. There is heat appreciated. Dorsiflexion does not increase any discomfort. He does pinpoint his discomfort to being just superior to ankle.  I contacted ultrasound and they report that utilizing ultrasonography they are able to delineate an acute DVT versus her chronic DVT. So we'll send the patient for an ultrasound of the lower extremity to evaluate for acute DVT. He could have an acute DVT in lites and subtherapeutic INR over the past few weeks.  The patient denies any chest pain, shortness of breath, cough, or increased fatigue. As a matter of fact, he is gaining weight. His energy level is much improved. He is doing yard work.  The patient also mentions his peripheral neuropathy. He is taking 900 mg of gabapentin at hour of  sleep. He reports that his very effective for him throughout the night and during the morning hours. He does admit that in the afternoon he starts having increased peripheral neuropathy symptoms including numbness and tingling of his hands and feet and also some shooting discomfort. So we will increase his gabapentin. He has a few options regarding how he would take this medication.  I've educated him on his options which include increasing his nighttime dose and/or providing a medication for a morning dose as well. His little wary of the side effect of drowsiness during the day and so I given him the liberty of choosing which option works best for him and his quality of life.  Patient would like to have his Port-A-Cath removed. He explains that his insurance will be expiring at the end of this month and would like to have this done before his insurance expires.  He would like to have a local surgeon remove this and his wife have suggested Dr. Leticia Penna. I placed a call to Dr. Mcneil Sober secretary and she is scheduled him for a appointment to see Dr. Leticia Penna on May 21 at 10:30 in the morning. I have given our secretary instructions on faxing a demographic sheet to [phone].  Objective: BP 112/72  Pulse 84  Temp(Src) 97.3 F (36.3 C) (Oral)  Ht 5\' 11"  (1.803 m)  Wt 195 lb 11.2 oz (88.769 kg)  BMI 27.29 kg/m2 Gen.: Patient seen in examination room accompanied by his wife. He appears very pleasant. Is not appear been acute distress. Alert and oriented x3.  HEENT: Atraumatic, normocephalic anicteric sclera Neck: Trachea midline, supple Skin: Warm and dry. Please see extremity exam below for further details regarding his skin inspection. Extremities: Left lower extremity he does not reveal any abnormalities. Right lower extremity the reveals 1+ pitting edema pretibially, erythema when compared to left lower extremity, and heat appreciated on palpation. Neuro: Alert oriented x3. No focal deficits  appreciated.  Assessment: 1. Acute versus chronic DVT of right lower extremity. 2. Peripheral neuropathy secondary to chemotherapy menstruation 3. Stage III cancer of the rectum status post resection withplacement of a colostomy by Dr. Sondra Come of which has been reversed at [facility] on July 23, 2011. He took adjuvant chemotherapy consisting of FOLFOX after completion of radiation and 5-FU by continuous infusion from May 7 through October 10, 2010 prior to his surgery.  Plan: 1. I provided the patient with a prescription which was E. scribed with an increase of his gabapentin. He is to take 3 tablets in our sleep and 3 tablets in the morning. I've given him a 3 month supply with 1 refill. I've given him patient dictation regarding the usage of gabapentin.  2. patient education regarding peripheral neuropathy secondary to chemotherapy 3. Right lower extremity ultrasound to reevaluate for acute versus chronic DVT. 4. If the ultrasound is positive for an acute DVT, we'll give him Lovenox 1.5 milligram per kilogram for 10 days in addition to continuation of his Coumadin. We will then anticoagulate him aggressively with a goal of an INR between 2.5-3.5. 5. I've contacted Dr. Leticia Penna (general surgeon) for consideration of Port-A-Cath removal. His appointment for this has been scheduled for 09/23/2011 at 10:30 AM   All questions were answered.  The patient knows to call the clinic with any questions or concern.  More than 50% of the time spent with the patient was utilized for counseling and coordination of care.  Shafin Pollio    Addendum:  Euclide's Korea of right LE reveals no new acute DVT, but in light of the erythema, heat, and edema which was not there until most recently, I will start him on Lovenox for 10 days.  Now this may need to be altered due port-a-cath removal, but we will cross that bridge when we get there.  His wife will call us tomorrow AM regarding what dose of Lovenox he has at  home. I personally reviewed and went over radiographic studies with the patient via telephone.   Zaharah Amir

## 2011-09-17 NOTE — Progress Notes (Signed)
Comes in today c/o swelling in right leg and pain in leg near ankles. Also neurontin is helping some with hands and feet but wears off around 1-2 pm.

## 2011-09-18 ENCOUNTER — Telehealth (HOSPITAL_COMMUNITY): Payer: Self-pay | Admitting: *Deleted

## 2011-09-18 NOTE — Telephone Encounter (Signed)
Pt notified to start Lovenox 130 mg subcutaneous daily and call us next week after he sees Dr. Caesar Bookman.  He will continue coumadin as well.

## 2011-09-23 ENCOUNTER — Encounter (HOSPITAL_COMMUNITY): Payer: Self-pay | Admitting: Pharmacy Technician

## 2011-09-24 ENCOUNTER — Encounter (HOSPITAL_COMMUNITY)
Admission: RE | Disposition: A | Discharge status: Home / Self Care | Payer: Self-pay | Source: Ambulatory Visit | Attending: General Surgery

## 2011-09-24 ENCOUNTER — Ambulatory Visit (HOSPITAL_COMMUNITY)
Admission: RE | Admit: 2011-09-24 | Discharge: 2011-09-24 | Disposition: A | Discharge status: Home / Self Care | Payer: Medicaid Other | Source: Ambulatory Visit | Attending: General Surgery | Admitting: General Surgery

## 2011-09-24 ENCOUNTER — Encounter (HOSPITAL_COMMUNITY): Payer: Self-pay | Admitting: *Deleted

## 2011-09-24 DIAGNOSIS — Z452 Encounter for adjustment and management of vascular access device: Secondary | ICD-10-CM | POA: Insufficient documentation

## 2011-09-24 DIAGNOSIS — I10 Essential (primary) hypertension: Secondary | ICD-10-CM | POA: Insufficient documentation

## 2011-09-24 DIAGNOSIS — C189 Malignant neoplasm of colon, unspecified: Secondary | ICD-10-CM | POA: Insufficient documentation

## 2011-09-24 DIAGNOSIS — Z7901 Long term (current) use of anticoagulants: Secondary | ICD-10-CM | POA: Insufficient documentation

## 2011-09-24 HISTORY — PX: PORT-A-CATH REMOVAL: SHX5289

## 2011-09-24 LAB — PROTIME-INR
INR: 1.8 — ABNORMAL HIGH (ref 0.00–1.49)
Prothrombin Time: 21.2 seconds — ABNORMAL HIGH (ref 11.6–15.2)

## 2011-09-24 SURGERY — REMOVAL PORT-A-CATH
Anesthesia: LOCAL

## 2011-09-24 MED ORDER — LIDOCAINE HCL (PF) 1 % IJ SOLN
INTRAMUSCULAR | Status: AC
  Filled 2011-09-24: qty 30

## 2011-09-24 MED ORDER — LIDOCAINE HCL (PF) 1 % IJ SOLN
INTRAMUSCULAR | Status: DC | PRN
  Administered 2011-09-24: 15 mL

## 2011-09-24 MED ORDER — HYDROCODONE-ACETAMINOPHEN 5-325 MG PO TABS: 1.0000 | ORAL_TABLET | ORAL | Status: AC | PRN

## 2011-09-24 SURGICAL SUPPLY — 21 items
BAG HAMPER (MISCELLANEOUS) IMPLANT
CLOTH BEACON ORANGE TIMEOUT ST (SAFETY) IMPLANT
COVER LIGHT HANDLE STERIS (MISCELLANEOUS) IMPLANT
DURAPREP 26ML APPLICATOR (WOUND CARE) IMPLANT
ELECT REM PT RETURN 9FT ADLT (ELECTROSURGICAL)
ELECTRODE REM PT RTRN 9FT ADLT (ELECTROSURGICAL) IMPLANT
GLOVE BIOGEL PI IND STRL 7.5 (GLOVE) IMPLANT
GLOVE BIOGEL PI INDICATOR 7.5 (GLOVE)
GOWN STRL REIN XL XLG (GOWN DISPOSABLE) IMPLANT
KIT ROOM TURNOVER APOR (KITS) IMPLANT
MANIFOLD NEPTUNE II (INSTRUMENTS) IMPLANT
NEEDLE HYPO 25X1 1.5 SAFETY (NEEDLE) IMPLANT
NS IRRIG 1000ML POUR BTL (IV SOLUTION) IMPLANT
PACK MINOR (CUSTOM PROCEDURE TRAY) IMPLANT
PAD ARMBOARD 7.5X6 YLW CONV (MISCELLANEOUS) IMPLANT
SET BASIN LINEN APH (SET/KITS/TRAYS/PACK) IMPLANT
SPONGE GAUZE 2X2 8PLY STRL LF (GAUZE/BANDAGES/DRESSINGS) IMPLANT
STRIP CLOSURE SKIN 1/4X3 (GAUZE/BANDAGES/DRESSINGS) IMPLANT
SUT MNCRL AB 4-0 PS2 18 (SUTURE) IMPLANT
SYR CONTROL 10ML LL (SYRINGE) IMPLANT
TOWEL OR 17X26 4PK STRL BLUE (TOWEL DISPOSABLE) IMPLANT

## 2011-09-24 NOTE — Progress Notes (Signed)
Received telephone call from pt's wife on 09/23/11 that pt is to have port removed at 730 am 09/24/11. Needed instructions for Coumadin and Lovenox. Per Dr.Neijstrom instruct to HOLD Coumadin and Lovenox on 09/23/11. Restart same dose Lovenox on Wednesday evening. Restart Coumadin same dose 6 mg on Thursday evening in conjunction with Lovenox. Recheck pt/inr on Friday and next Tuesday. Wife verbalized understanding.

## 2011-09-24 NOTE — Discharge Instructions (Signed)
Call Dr Leticia Penna for any problems [phone] Or  [facility] [phone]

## 2011-09-24 NOTE — Op Note (Signed)
Patient:  Charles [name]  DOB:  February 07, 1951  MRN:  [id number]   Preop Diagnosis:  Colon cancer  Postop Diagnosis:  Same  Procedure:  Port-A-Cath removal  Surgeon:  Dr. Tilford Pillar  Anes:  Local anesthetic using 1% lidocaine plain.  Indications:  Patient is a 61 year old male known to me with a history of colon cancer. He had previously placed Port-A-Cath. At this time he's completed all treatments. He is no longer using the Port-A-Cath and at this time wishes to have it removed. Risks benefits and alternatives were discussed. Patient's questions and concerns were addressed. Patient was consented for the planned procedure.  Procedure note:  Patient is placed in a supine position in the minor procedure room. His right chest wall was prepped with DuraPrep solution and draped out in standard fashion. Local anesthetic is instilled. His previous incisional scars opened with a 15 blade scalpel. Dissection is carried out down to the port. The capsule was freed and the port is removed from the capsular attachments. With the catheter removed pressure was held along the internal jugular insertion site. This is performed by the assisting RN. At this time attention was turned to closure of the skin edges. Hemostasis is excellent. A 4-0 Monocryl was utilized in running subcuticular suture to reapproximate the skin edges. The skin was washed and dry with a moistened dry towel. Benzoin is applied around the incision. Half-inch Steri-Strips are placed. The drapes removed the patient is comfortable. All sharps were disposed of in proper accordance.  Complications:  None  EBL:  Scant  Specimen:  None

## 2011-09-26 ENCOUNTER — Encounter (HOSPITAL_BASED_OUTPATIENT_CLINIC_OR_DEPARTMENT_OTHER): Payer: Medicaid Other

## 2011-09-26 ENCOUNTER — Other Ambulatory Visit (HOSPITAL_COMMUNITY): Payer: Self-pay | Admitting: Oncology

## 2011-09-26 DIAGNOSIS — I82409 Acute embolism and thrombosis of unspecified deep veins of unspecified lower extremity: Secondary | ICD-10-CM

## 2011-09-26 LAB — PROTIME-INR
INR: 1.21 (ref 0.00–1.49)
Prothrombin Time: 15.6 seconds — ABNORMAL HIGH (ref 11.6–15.2)

## 2011-09-30 ENCOUNTER — Other Ambulatory Visit (HOSPITAL_COMMUNITY): Payer: Self-pay | Admitting: Oncology

## 2011-09-30 ENCOUNTER — Encounter (HOSPITAL_BASED_OUTPATIENT_CLINIC_OR_DEPARTMENT_OTHER): Payer: Medicaid Other

## 2011-09-30 DIAGNOSIS — C189 Malignant neoplasm of colon, unspecified: Secondary | ICD-10-CM

## 2011-09-30 DIAGNOSIS — I82409 Acute embolism and thrombosis of unspecified deep veins of unspecified lower extremity: Secondary | ICD-10-CM

## 2011-09-30 LAB — PROTIME-INR
INR: 1.53 — ABNORMAL HIGH (ref 0.00–1.49)
Prothrombin Time: 18.7 seconds — ABNORMAL HIGH (ref 11.6–15.2)

## 2011-09-30 NOTE — H&P (Signed)
NTS SOAP Note  Vital Signs:  Vitals as of: 09/23/2011: Systolic 123: Diastolic 76: Heart Rate 78: Temp 97.38F: Height 63ft 1in: Weight 194Lbs 0 Ounces: BMI 26  BMI : 25.59 kg/m2  Subjective: This 61 Years 48 Months old Male presents for of port.  Patient has recently completed all planned treatments for his colon cancer.  He has had a reversal of his colostomy.  He presents for removal of his port.  Review of Symptoms:  Constitutional:unremarkable   Head:unremarkable    Eyes:unremarkable   Nose/Mouth/Throat:unremarkable Cardiovascular:  unremarkable   Respiratory:unremarkable   Gastrointestinal:  unremarkable   Genitourinary:unremarkable     Musculoskeletal:unremarkable   Skin:unremarkable Breast:unremarkable   Hematolgic/Lymphatic:unremarkable     Allergic/Immunologic:unremarkable     Past Medical History:  Obtained     Past Medical History  Surgical History: colectomy, colostomy reversal, port placement. Medical Problems: colon cancer. Psychiatric History: none Allergies: NKDA Medications: reviewed with patient   Social History:Obtained  Social History  Preferred Language: English ([place]) Race:  White Ethnicity: Not Hispanic / Latino Age: 61 Years 0 Months Marital Status:  M Alcohol: no Recreational drug(s): no   Smoking Status: Never smoker Age: 61 Years 0 Months Marital Status:  M  Family History:Obtained     Family History  Is there a family history [id number]    Objective Information: General:  Well appearing, well nourished in no distress. Skin:     no rash or prominent lesions Head:Atraumatic; no masses; no abnormalities Eyes:  conjunctiva clear, EOM intact, PERRL Mouth:  Mucous membranes moist, no mucosal lesions.  Poor dentition Neck:  Supple without lymphadenopathy.  Heart:  RRR, no murmur Lungs:    CTA bilaterally, no wheezes, rhonchi, rales.  Breathing  unlabored. Abdomen:Soft, NT/ND, no HSM, no masses. Extremities:  No deformities, clubbing, cyanosis, or edema.   Assessment:  Diagnosis &amp; Procedure: DiagnosisCode: 153.9, ProcedureCode: 47829,    Plan: Port.  History of colon CA.  Options discussed with patient.  Patient will hold lovenox today. Plan to proceed tomorrow.  Patient Education:Alternative treatments to surgery were discussed with patient (and family).  Risks and benefits  of procedure were fully explained to the patient (and family) who gave informed consent. Patient/family questions were addressed.  Follow-up:Pending Surgery                            Active Diagnosis and Procedures: 153.9 Malignant neoplasm of colon, unspecified   99201 - OFFICE OUTPATIENT NEW 10 MINUTES   This note has been electronically signed by

## 2011-10-02 ENCOUNTER — Encounter (HOSPITAL_COMMUNITY): Payer: Self-pay | Admitting: General Surgery

## 2011-10-02 ENCOUNTER — Encounter (HOSPITAL_COMMUNITY): Payer: Medicaid Other

## 2011-10-06 ENCOUNTER — Other Ambulatory Visit (HOSPITAL_COMMUNITY): Payer: Self-pay | Admitting: Oncology

## 2011-10-06 ENCOUNTER — Encounter (HOSPITAL_COMMUNITY): Payer: Self-pay | Attending: Hematology and Oncology

## 2011-10-06 DIAGNOSIS — I82409 Acute embolism and thrombosis of unspecified deep veins of unspecified lower extremity: Secondary | ICD-10-CM | POA: Insufficient documentation

## 2011-10-06 DIAGNOSIS — C189 Malignant neoplasm of colon, unspecified: Secondary | ICD-10-CM | POA: Insufficient documentation

## 2011-10-06 LAB — PROTIME-INR
INR: 1.65 — ABNORMAL HIGH (ref 0.00–1.49)
Prothrombin Time: 19.8 seconds — ABNORMAL HIGH (ref 11.6–15.2)

## 2011-10-07 ENCOUNTER — Other Ambulatory Visit (HOSPITAL_COMMUNITY): Payer: Self-pay

## 2011-10-07 DIAGNOSIS — I82409 Acute embolism and thrombosis of unspecified deep veins of unspecified lower extremity: Secondary | ICD-10-CM

## 2011-10-08 ENCOUNTER — Telehealth (HOSPITAL_COMMUNITY): Payer: Self-pay | Admitting: Oncology

## 2011-10-08 NOTE — Telephone Encounter (Signed)
PC TO PT BUT SPOKE TO HIS WIFE RE: MEDICAID TERMINATION ON 10/03/11 SHE STATED THAT IN ORDER TO GET INS REINSTATED THEY WOULD NEED TO ACQUIRE $20,000. IN HOSP CHARGE STARTING 10/03/11. I ADVSD HER TO  FILL OUT A FIN APP AND EXPLAINED TO HER WHAT THE SERVICE WAS.

## 2011-10-10 ENCOUNTER — Other Ambulatory Visit (HOSPITAL_COMMUNITY): Payer: Self-pay | Admitting: Oncology

## 2011-10-10 ENCOUNTER — Encounter (HOSPITAL_BASED_OUTPATIENT_CLINIC_OR_DEPARTMENT_OTHER): Payer: Self-pay

## 2011-10-10 DIAGNOSIS — I82409 Acute embolism and thrombosis of unspecified deep veins of unspecified lower extremity: Secondary | ICD-10-CM

## 2011-10-10 LAB — PROTIME-INR
INR: 1.85 — ABNORMAL HIGH (ref 0.00–1.49)
Prothrombin Time: 21.7 seconds — ABNORMAL HIGH (ref 11.6–15.2)

## 2011-10-10 NOTE — Progress Notes (Signed)
Charles [name] presented for labwork. Labs per MD order drawn via Peripheral Line 23 gauge needle inserted in left antecubital.  Good blood return present. Procedure without incident.  Needle removed intact. Patient tolerated procedure well. Patient reports he took Coumadin 8mg  Mon, 8mg  Tue, 7mg  Wed, and he thinks he may have missed a 6mg  dose last night.

## 2011-10-15 ENCOUNTER — Other Ambulatory Visit (HOSPITAL_COMMUNITY): Payer: Self-pay | Admitting: Oncology

## 2011-10-15 ENCOUNTER — Encounter (HOSPITAL_BASED_OUTPATIENT_CLINIC_OR_DEPARTMENT_OTHER): Payer: Self-pay

## 2011-10-15 DIAGNOSIS — I82409 Acute embolism and thrombosis of unspecified deep veins of unspecified lower extremity: Secondary | ICD-10-CM

## 2011-10-15 LAB — PROTIME-INR
INR: 1.71 — ABNORMAL HIGH (ref 0.00–1.49)
Prothrombin Time: 20.4 seconds — ABNORMAL HIGH (ref 11.6–15.2)

## 2011-10-15 NOTE — Progress Notes (Signed)
Labs drawn today for pt. Patient on 7 and 6mg  alternating.  Call patient at [phone]

## 2011-10-22 ENCOUNTER — Other Ambulatory Visit (HOSPITAL_COMMUNITY): Payer: Self-pay | Admitting: Oncology

## 2011-10-22 ENCOUNTER — Encounter (HOSPITAL_COMMUNITY): Payer: Self-pay

## 2011-10-22 DIAGNOSIS — I82409 Acute embolism and thrombosis of unspecified deep veins of unspecified lower extremity: Secondary | ICD-10-CM

## 2011-10-22 LAB — PROTIME-INR
INR: 2.82 — ABNORMAL HIGH (ref 0.00–1.49)
Prothrombin Time: 30.1 seconds — ABNORMAL HIGH (ref 11.6–15.2)

## 2011-10-22 NOTE — Progress Notes (Signed)
Labs drawn today for pt.  Patient on 8mg .  Call patient at [phone].

## 2011-10-27 ENCOUNTER — Encounter (HOSPITAL_COMMUNITY): Payer: Self-pay

## 2011-10-27 ENCOUNTER — Other Ambulatory Visit (HOSPITAL_COMMUNITY): Payer: Self-pay | Admitting: Oncology

## 2011-10-27 DIAGNOSIS — I82409 Acute embolism and thrombosis of unspecified deep veins of unspecified lower extremity: Secondary | ICD-10-CM

## 2011-10-27 LAB — PROTIME-INR
INR: 3 — ABNORMAL HIGH (ref 0.00–1.49)
Prothrombin Time: 31.6 seconds — ABNORMAL HIGH (ref 11.6–15.2)

## 2011-10-31 ENCOUNTER — Encounter (HOSPITAL_BASED_OUTPATIENT_CLINIC_OR_DEPARTMENT_OTHER): Payer: Self-pay

## 2011-10-31 DIAGNOSIS — I82409 Acute embolism and thrombosis of unspecified deep veins of unspecified lower extremity: Secondary | ICD-10-CM

## 2011-10-31 LAB — PROTIME-INR
INR: 2.42 — ABNORMAL HIGH (ref 0.00–1.49)
Prothrombin Time: 26.7 seconds — ABNORMAL HIGH (ref 11.6–15.2)

## 2011-11-04 ENCOUNTER — Other Ambulatory Visit (HOSPITAL_COMMUNITY): Payer: Self-pay

## 2011-11-04 DIAGNOSIS — I82409 Acute embolism and thrombosis of unspecified deep veins of unspecified lower extremity: Secondary | ICD-10-CM

## 2011-11-07 ENCOUNTER — Encounter (HOSPITAL_COMMUNITY): Payer: Self-pay | Attending: Hematology and Oncology

## 2011-11-07 DIAGNOSIS — I82409 Acute embolism and thrombosis of unspecified deep veins of unspecified lower extremity: Secondary | ICD-10-CM | POA: Insufficient documentation

## 2011-11-07 DIAGNOSIS — C189 Malignant neoplasm of colon, unspecified: Secondary | ICD-10-CM | POA: Insufficient documentation

## 2011-11-07 LAB — PROTIME-INR
INR: 2.78 — ABNORMAL HIGH (ref 0.00–1.49)
Prothrombin Time: 29.8 seconds — ABNORMAL HIGH (ref 11.6–15.2)

## 2011-11-07 NOTE — Progress Notes (Signed)
Coumadin 8mg  daily. Continue same dose. Return in 12 days for inr. Wife verbalized understanding.

## 2011-11-08 LAB — CEA: CEA: 1.1 ng/mL (ref 0.0–5.0)

## 2011-11-14 ENCOUNTER — Other Ambulatory Visit (HOSPITAL_COMMUNITY): Payer: Medicaid Other

## 2011-11-18 ENCOUNTER — Encounter (HOSPITAL_BASED_OUTPATIENT_CLINIC_OR_DEPARTMENT_OTHER): Payer: Self-pay | Admitting: Oncology

## 2011-11-18 ENCOUNTER — Encounter (HOSPITAL_COMMUNITY): Payer: Self-pay

## 2011-11-18 VITALS — BP 123/74 | HR 83 | Temp 97.2°F | Wt 206.1 lb

## 2011-11-18 DIAGNOSIS — K625 Hemorrhage of anus and rectum: Secondary | ICD-10-CM

## 2011-11-18 DIAGNOSIS — C20 Malignant neoplasm of rectum: Secondary | ICD-10-CM

## 2011-11-18 DIAGNOSIS — G609 Hereditary and idiopathic neuropathy, unspecified: Secondary | ICD-10-CM

## 2011-11-18 DIAGNOSIS — C189 Malignant neoplasm of colon, unspecified: Secondary | ICD-10-CM

## 2011-11-18 DIAGNOSIS — Z86718 Personal history of other venous thrombosis and embolism: Secondary | ICD-10-CM

## 2011-11-18 DIAGNOSIS — I82409 Acute embolism and thrombosis of unspecified deep veins of unspecified lower extremity: Secondary | ICD-10-CM

## 2011-11-18 LAB — PROTIME-INR
INR: 2.58 — ABNORMAL HIGH (ref 0.00–1.49)
Prothrombin Time: 28.1 seconds — ABNORMAL HIGH (ref 11.6–15.2)

## 2011-11-18 NOTE — Progress Notes (Signed)
Labs drawn today for pt.  Patient on 8mg  of coud.   Call patient at [phone].

## 2011-11-18 NOTE — Progress Notes (Signed)
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 Colon cancer  omeprazole (PRILOSEC OTC) 20 MG tablet, CEA, CEA, CBC, Differential, Basic metabolic panel    CURRENT THERAPY: Observation with CEA every 3 months  INTERVAL HISTORY: Charles [name] 61 y.o. male returns for  regular  visit for followup of  Stage III cancer of the rectum status post resection with placement of a colostomy by Dr. Sondra Come of which has been recently reversed at [facility] in March or April 2013. He took adjuvant chemotherapy consisting of FOLFOX after completion of radiation and 5-FU by continuous infusion from May 7 through October 10, 2010 prior to his surgery.  FOLFOX was administered from October 2012- July 14, 2011.  Charles [name] is doing very well.  He has 3 complaints.   1. Peripheral neuropathy.  It is limited to B/L feet and B/L finger tips.  He is on a heft dose of Gabapentin 2100 mg daily.  I explained that this will resolve hopefully 100% over time. He does admit that it is minimally better since completion of therapy.  He is to continue with the Gabapentin.  I will not increase dose at this time.   2. Anal bleeding.  He reports that he has some anal bleeding that is not internal.  His wife interjects, she has inspected it at home, and she reports small "cracks" on the periphery of the anus.  They are not bleeding presently and are much improved over the past week or so with Disodan cream.  He is also using baby wipes after BM to help keep this area clean. In the past he had a difficult time sitting and had to focus the majority of his weight on one of the buttocks.  I have encouraged him to continue with present management and if it worsens, follow-up with surgeon.   3. Anal leakage of stool.  He reports that he was wearing depends.  Now he is using paper towels and this is effective for him.  The leakage is brown/tan stool.  He was told by his surgeon to expect that for up to 1 year after reversal of his  ilieostomy.  This is likely contributing to #2.  He is cleaning himself well and then using the Disodan cream.  Again, I will refer this to surgery if persists or worsens.   Otherwise, Charles [name] is doing well.  He will require at least 1 year of anticoagulation from Jan 2013 - Jan 2014.  In May 2013, a [place] of right LE revealed continued DVT.  So we will repeat this study again in Jan 2014 before discontinuing Coumadin.  We will perform CEA every 3 months.  We will perform yearly CT scans for 2-3 years.  He is agreeable to this plan.  He is working with [facility] for assistance with his medical bills.    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  He is very active now.  He is mowing his lawn and performing all ADLs. He looks great.  Past Medical History  Diagnosis Date  . HTN (hypertension)   . Pulmonary embolism   . GERD (gastroesophageal reflux disease)   . Depression 04/01/2011  . DVT (deep venous thrombosis) 05/09/2011  . 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 Colon cancer 07/24/10    rectal ca, inv adenocarcinoma  . Hx of radiation therapy 09/02/10 to 10/14/10    pelvis    has GERD; DIARRHEA; Colon cancer; Weight loss, abnormal; Dehydration; Depression;  DVT (deep venous thrombosis); High output ileostomy; and Hx of radiation therapy on his problem list.      has no known allergies.  [name] had no medications administered during this visit.  Past Surgical History  Procedure Date  . Esophagogastroduodenoscopy 07/2010    schatki ring s/p dilation, small hh, SB bx benign  . Colonoscopy 07/2010    proximal rectal apple core mass 10-14cm from anal verge (adenocarcinoma), 2-3cm distal rectal carpet polyp s/p piecemeal snare polypectomy (adenoma)  . Eus 08/2010    Dr. Rob Bunting. uT3N0 circumferential, nearly obstruction rectosigmoid adenocarcinoma, distal edge 12cm from anal verge  . Ivc filter   . Colon surgery 11/14/2010    proctectomy with colorectal anastomosis and diverting  loop ileostomy (temporary planned)  . Port a cath placement   . Colostomy reversal april 2013  . Port-a-cath removal 09/24/2011    Procedure: REMOVAL PORT-A-CATH;  Surgeon: Fabio Bering, MD;  Location: [facility];  Service: General;  Laterality: N/A;  Minor Room    Denies any headaches, dizziness, double vision, fevers, chills, night sweats, nausea, vomiting, diarrhea, constipation, chest pain, heart palpitations, shortness of breath, blood in stool, black tarry stool, urinary pain, urinary burning, urinary frequency, hematuria.   PHYSICAL EXAMINATION  ECOG PERFORMANCE STATUS: 0 - Asymptomatic  Filed Vitals:   11/18/11 1033  BP: 123/74  Pulse: 83  Temp: 97.2 F (36.2 C)    GENERAL:alert, healthy, no distress, well nourished, well developed, comfortable, cooperative and smiling SKIN: skin color, texture, turgor are normal, no rashes or significant lesions HEAD: Normocephalic, No masses, lesions, tenderness or abnormalities EYES: normal, Conjunctiva are pink and non-injected EARS: External ears normal OROPHARYNX:lips, buccal mucosa, and tongue normal and mucous membranes are moist  NECK: supple, trachea midline LYMPH:  no palpable lymphadenopathy, no hepatosplenomegaly BREAST:not examined LUNGS: clear to auscultation and percussion HEART: regular rate & rhythm, no murmurs, no gallops, S1 normal and S2 normal ABDOMEN:abdomen soft, non-tender and normal bowel sounds BACK: Back symmetric, no curvature. EXTREMITIES:less then 2 second capillary refill, no joint deformities, effusion, or inflammation, no edema, no skin discoloration, no clubbing, no cyanosis  NEURO: alert & oriented x 3 with fluent speech, no focal motor/sensory deficits, gait normal   LABORATORY DATA: Lab Results  Component Value Date   INR 2.58* 11/18/2011   INR 2.78* 11/07/2011   INR 2.42* 10/31/2011      ASSESSMENT:  1. Stage III Rectal cancer, S/P radiation and chemotherapy 09/09/2010- 10/10/2010, then surgical  resection, now S/P 6 cycles of FOLFOX chemotherapy from October 2012- 07/14/2011.  2. S/P IVC filter placement  3. On Coumadin anticoagulation for PE in the past.   4. Depression, on citalopram with good results  5. DVT dx on May 06 2011 while on Coumadin, but subtherapeutic 1 week prior to Hospital visit at 1.2 according to the patient.  6. Peripheral neuropathy, grade 1    PLAN:  1. I personally reviewed and went over laboratory results with the patient. 2. PT/INR in 2 weeks.  Same dose of Coumadin. 3. Lab work every 3 months: CEA 4. Lab work in 6 months: CBC diff, CMET, CEA 5. Recommend the patient follow-up with surgeon regarding rectal bleeding and/or stool leakage from anus.  He will do this if worsens.  6. Continue Disodan cream to anal area 7. Will perform a R unilateral LE Korea to evaluate for DVT resolution before discontinuing Coumadin 8. Continue Coumadin at least until Jan 2014. 9. CT abd/pelvis with contrast in March  2014.  This surveillance will be done yearly x 2-3 years.  This will need to be ordered on next follow-up visit. 10. Continue with Gabapentin as directed 900 mg in AM, 600 mg in afternoon, and 600 mg in PM. 11. Return in 6 months for follow-up  All questions were answered. The patient knows to call the clinic with any problems, questions or concerns. We can certainly see the patient much sooner if necessary.  The patient and plan discussed with Glenford Peers, MD and he is in agreement with the aforementioned.   Elvis Boot

## 2011-11-18 NOTE — Patient Instructions (Signed)
[facility]  Discharge Instructions  RECOMMENDATIONS MADE BY THE CONSULTANT AND ANY TEST RESULTS WILL BE SENT TO YOUR REFERRING DOCTOR.   We will do CEA levels every 3 months. We will do pt/inr as needed. Return to clinic in 6 months to see doctor.   I acknowledge that I have been informed and understand all the instructions given to me and received a copy. I do not have any more questions at this time, but understand that I may call the Specialty Clinic at [facility] at [phone] during business hours should I have any further questions or need assistance in obtaining follow-up care.    __________________________________________  _____________  __________ Signature of Patient or Authorized Representative            Date                   Time    __________________________________________ Nurse's Signature

## 2011-11-21 ENCOUNTER — Ambulatory Visit (HOSPITAL_COMMUNITY): Payer: Medicaid Other | Admitting: Oncology

## 2011-11-25 ENCOUNTER — Other Ambulatory Visit (HOSPITAL_COMMUNITY): Payer: Self-pay | Admitting: *Deleted

## 2011-11-25 ENCOUNTER — Other Ambulatory Visit (HOSPITAL_COMMUNITY): Payer: Self-pay | Admitting: Oncology

## 2011-11-25 DIAGNOSIS — I82409 Acute embolism and thrombosis of unspecified deep veins of unspecified lower extremity: Secondary | ICD-10-CM

## 2011-11-25 MED ORDER — WARFARIN SODIUM 4 MG PO TABS: ORAL_TABLET | ORAL | Status: DC

## 2011-12-02 ENCOUNTER — Other Ambulatory Visit (HOSPITAL_COMMUNITY): Payer: Self-pay

## 2011-12-03 ENCOUNTER — Other Ambulatory Visit (HOSPITAL_COMMUNITY): Payer: Self-pay | Admitting: Oncology

## 2011-12-03 ENCOUNTER — Encounter (HOSPITAL_BASED_OUTPATIENT_CLINIC_OR_DEPARTMENT_OTHER): Payer: Self-pay

## 2011-12-03 DIAGNOSIS — I82409 Acute embolism and thrombosis of unspecified deep veins of unspecified lower extremity: Secondary | ICD-10-CM

## 2011-12-03 LAB — PROTIME-INR
INR: 2.67 — ABNORMAL HIGH (ref 0.00–1.49)
Prothrombin Time: 28.9 s — ABNORMAL HIGH (ref 11.6–15.2)

## 2011-12-03 NOTE — Progress Notes (Signed)
Labs drawn today for pt 

## 2011-12-15 ENCOUNTER — Encounter (HOSPITAL_COMMUNITY): Payer: Self-pay | Attending: Hematology and Oncology

## 2011-12-15 ENCOUNTER — Other Ambulatory Visit (HOSPITAL_COMMUNITY): Payer: Self-pay | Admitting: Oncology

## 2011-12-15 DIAGNOSIS — I82409 Acute embolism and thrombosis of unspecified deep veins of unspecified lower extremity: Secondary | ICD-10-CM | POA: Insufficient documentation

## 2011-12-15 LAB — PROTIME-INR
INR: 1.94 — ABNORMAL HIGH (ref 0.00–1.49)
Prothrombin Time: 22.5 seconds — ABNORMAL HIGH (ref 11.6–15.2)

## 2011-12-15 NOTE — Progress Notes (Signed)
Labs drawn today for pt 

## 2011-12-16 ENCOUNTER — Other Ambulatory Visit (HOSPITAL_COMMUNITY): Payer: Self-pay

## 2011-12-24 ENCOUNTER — Other Ambulatory Visit (HOSPITAL_COMMUNITY): Payer: Self-pay | Admitting: Oncology

## 2011-12-24 ENCOUNTER — Encounter (HOSPITAL_BASED_OUTPATIENT_CLINIC_OR_DEPARTMENT_OTHER): Payer: Self-pay

## 2011-12-24 DIAGNOSIS — I82409 Acute embolism and thrombosis of unspecified deep veins of unspecified lower extremity: Secondary | ICD-10-CM

## 2011-12-24 LAB — PROTIME-INR
INR: 2.91 — ABNORMAL HIGH (ref 0.00–1.49)
Prothrombin Time: 30.9 seconds — ABNORMAL HIGH (ref 11.6–15.2)

## 2011-12-24 NOTE — Progress Notes (Signed)
Labs drawn today for pt 

## 2011-12-25 ENCOUNTER — Other Ambulatory Visit (HOSPITAL_COMMUNITY): Payer: Self-pay

## 2011-12-31 ENCOUNTER — Encounter (HOSPITAL_BASED_OUTPATIENT_CLINIC_OR_DEPARTMENT_OTHER): Payer: Self-pay

## 2011-12-31 DIAGNOSIS — I82409 Acute embolism and thrombosis of unspecified deep veins of unspecified lower extremity: Secondary | ICD-10-CM

## 2011-12-31 LAB — PROTIME-INR
INR: 2.07 — ABNORMAL HIGH (ref 0.00–1.49)
Prothrombin Time: 23.7 seconds — ABNORMAL HIGH (ref 11.6–15.2)

## 2011-12-31 NOTE — Progress Notes (Signed)
Per Dr. Thornton Papas orders Mrs. Charles [name] instructed that Charles [name] is  to continue same dosage of coumadin and to return for next PT/INR on 01/14/12.  Verbalizes understanding.

## 2011-12-31 NOTE — Progress Notes (Signed)
Labs drawn today for pt 

## 2011-12-31 NOTE — Addendum Note (Signed)
Addended by: Evelena Leyden on: 12/31/2011 06:33 PM   Modules accepted: Orders

## 2012-01-01 ENCOUNTER — Other Ambulatory Visit (HOSPITAL_COMMUNITY): Payer: Self-pay | Admitting: Oncology

## 2012-01-01 DIAGNOSIS — I82409 Acute embolism and thrombosis of unspecified deep veins of unspecified lower extremity: Secondary | ICD-10-CM

## 2012-01-14 ENCOUNTER — Other Ambulatory Visit (HOSPITAL_COMMUNITY): Payer: Self-pay | Admitting: Oncology

## 2012-01-14 ENCOUNTER — Encounter (HOSPITAL_COMMUNITY): Payer: Self-pay | Attending: Hematology and Oncology

## 2012-01-14 DIAGNOSIS — I82409 Acute embolism and thrombosis of unspecified deep veins of unspecified lower extremity: Secondary | ICD-10-CM

## 2012-01-14 LAB — PROTIME-INR
INR: 1.99 — ABNORMAL HIGH (ref 0.00–1.49)
Prothrombin Time: 22.9 seconds — ABNORMAL HIGH (ref 11.6–15.2)

## 2012-01-14 NOTE — Progress Notes (Signed)
Labs drawn today for pt 

## 2012-01-30 ENCOUNTER — Other Ambulatory Visit (HOSPITAL_COMMUNITY): Payer: Self-pay | Admitting: Oncology

## 2012-01-30 ENCOUNTER — Telehealth (HOSPITAL_COMMUNITY): Payer: Self-pay | Admitting: *Deleted

## 2012-01-30 DIAGNOSIS — I82409 Acute embolism and thrombosis of unspecified deep veins of unspecified lower extremity: Secondary | ICD-10-CM

## 2012-01-30 MED ORDER — WARFARIN SODIUM 4 MG PO TABS: ORAL_TABLET | ORAL | Status: DC

## 2012-02-04 ENCOUNTER — Other Ambulatory Visit (HOSPITAL_COMMUNITY): Payer: Self-pay | Admitting: Oncology

## 2012-02-04 ENCOUNTER — Encounter (HOSPITAL_COMMUNITY): Payer: Self-pay | Attending: Hematology and Oncology

## 2012-02-04 DIAGNOSIS — I82409 Acute embolism and thrombosis of unspecified deep veins of unspecified lower extremity: Secondary | ICD-10-CM

## 2012-02-04 DIAGNOSIS — C189 Malignant neoplasm of colon, unspecified: Secondary | ICD-10-CM | POA: Insufficient documentation

## 2012-02-04 DIAGNOSIS — K6289 Other specified diseases of anus and rectum: Secondary | ICD-10-CM | POA: Insufficient documentation

## 2012-02-04 LAB — PROTIME-INR
INR: 2.52 — ABNORMAL HIGH (ref 0.00–1.49)
Prothrombin Time: 26 seconds — ABNORMAL HIGH (ref 11.6–15.2)

## 2012-02-04 NOTE — Progress Notes (Signed)
Labs drawn today for pt 

## 2012-02-13 ENCOUNTER — Other Ambulatory Visit (HOSPITAL_COMMUNITY): Payer: Medicaid Other

## 2012-02-18 ENCOUNTER — Other Ambulatory Visit (HOSPITAL_COMMUNITY): Payer: Self-pay

## 2012-02-25 ENCOUNTER — Other Ambulatory Visit (HOSPITAL_COMMUNITY): Payer: Self-pay

## 2012-03-03 ENCOUNTER — Telehealth (HOSPITAL_COMMUNITY): Payer: Self-pay

## 2012-03-03 ENCOUNTER — Encounter (HOSPITAL_COMMUNITY): Payer: Self-pay

## 2012-03-03 ENCOUNTER — Other Ambulatory Visit (HOSPITAL_COMMUNITY): Payer: Self-pay | Admitting: Oncology

## 2012-03-03 ENCOUNTER — Encounter (HOSPITAL_BASED_OUTPATIENT_CLINIC_OR_DEPARTMENT_OTHER): Payer: Self-pay | Admitting: Oncology

## 2012-03-03 VITALS — BP 135/81 | HR 77 | Temp 97.8°F | Resp 16 | Wt 221.2 lb

## 2012-03-03 DIAGNOSIS — C189 Malignant neoplasm of colon, unspecified: Secondary | ICD-10-CM

## 2012-03-03 DIAGNOSIS — R151 Fecal smearing: Secondary | ICD-10-CM

## 2012-03-03 DIAGNOSIS — I82409 Acute embolism and thrombosis of unspecified deep veins of unspecified lower extremity: Secondary | ICD-10-CM

## 2012-03-03 DIAGNOSIS — K6289 Other specified diseases of anus and rectum: Secondary | ICD-10-CM

## 2012-03-03 LAB — COMPREHENSIVE METABOLIC PANEL
ALT: 20 U/L (ref 0–53)
AST: 19 U/L (ref 0–37)
Albumin: 3.5 g/dL (ref 3.5–5.2)
Alkaline Phosphatase: 95 U/L (ref 39–117)
BUN: 13 mg/dL (ref 6–23)
CO2: 26 mEq/L (ref 19–32)
Calcium: 9.2 mg/dL (ref 8.4–10.5)
Chloride: 105 mEq/L (ref 96–112)
Creatinine, Ser: 0.85 mg/dL (ref 0.50–1.35)
GFR calc Af Amer: >90 mL/min (ref >90)
GFR calc non Af Amer: >90 mL/min (ref >90)
Glucose, Bld: 131 mg/dL — ABNORMAL HIGH (ref 70–99)
Potassium: 4.7 mEq/L (ref 3.5–5.1)
Sodium: 140 mEq/L (ref 135–145)
Total Bilirubin: 0.8 mg/dL (ref 0.3–1.2)
Total Protein: 6.3 g/dL (ref 6.0–8.3)

## 2012-03-03 LAB — PROTIME-INR
INR: 2.28 — ABNORMAL HIGH (ref 0.00–1.49)
Prothrombin Time: 24.1 seconds — ABNORMAL HIGH (ref 11.6–15.2)

## 2012-03-03 MED ORDER — CLOTRIMAZOLE-BETAMETHASONE 1-0.05 % EX CREA: TOPICAL_CREAM | Freq: Two times a day (BID) | CUTANEOUS | Status: DC

## 2012-03-03 NOTE — Telephone Encounter (Signed)
Spoke with patient's wife regarding PT/INR results and Coumadin instructions to continue same dose and return in 4 weeks for lab recheck.  Wife verbalized understanding and states that patient has enough Coumadin for the upcoming month.

## 2012-03-03 NOTE — Progress Notes (Signed)
Charles [name] is seen as a work in today. He is scheduled for laboratory work today but reports that he is unable to control his bowels. He says he has continual leaking from his rectum. Since his surgeon is in [place], you've work in today to be seen by me.  West[place] has a history of stage III rectal cancer status post resection with placement of colostomy by Dr. Sondra Come which has been recently reversed to [facility] [place] in March or April 2013. He underwent adjuvant chemotherapy consisting of FOLFOX after completion of radiation and 5-FU by continuous infusion from may 7 through 10/10/2010 prior to his surgery. FOLFOX was administered from October 2012 through 07/14/2011.  On his last encounter, in July 2013, he complained of stool leakage and was reported to expect this rub to one year after his ileostomy reversal. He is now 6 months out from his surgical procedure and I would expect this to cease or slow down.  The patient reports that his stools "look like mud". He has leaking from his anus of "beige" colored stool.  He admits to a feeling of "incomplete emptying". He reports that he has 5-6 bowel movements per day. He reports that her have a bowel movement in the morning and approximately 5 minutes later he feels as though he needs to go again. He reports back to the bathroom and has a subsequent bowel movement. This is ongoing all day long he reports. He explains his hind end is sore and on fire, "it feels like a match up my rear end."  He explains to me that he takes 3 stool softeners daily. I've asked him to decrease this to one daily and if he continues to have soft stools, he go to 1 tablet every other day. I've given him the liver revealed a just a stool softener dose accordingly.  He has tried Desitin cream, Vaseline, A + D ointment with zinc, butt cream, etc.  he reports that the Desitin cream work the best.  BP 135/81  Pulse 77  Temp 97.8 F (36.6 C) (Oral)  Resp 16  Wt 221 lb 3.2 oz (100.336  kg) Gen.: Alert and oriented x3. Pleasant. Accompanied by his wife. No acute distress. HEENT: Atraumatic, normocephalic. Neck: Trachea midline Rectum exam: Erythema around the the anus. Very tender to palpation. Digital exam is unimpressive, but stool is appreciated in the rectal vault. No masses or lesions appreciated on digital exam. Digital exam is painful. Sphincter tone is moderate.  Skin: Warm and dry Neuro: No focal deficits appreciated.  Assessment:  1. Stool leakage 2. Anal irritation secondary to #1. 3. Stage III rectal cancer  Plan: 1. CT abd/pelvis with contrast to evaluate for malignancy 2. Lotrisone cream BID x 10 days.  Apply Desodan on top of Lotrisone. 3. If CT is negative, will ask the patient to get an appointment with his surgeon, Dr. Sondra Come at [facility].  4. Decrease stool softener. 5. Return as scheduled for CEAs, INRs, and labs and for follow-up in Jan 2014.  All questions were answered. The patient knows to call the clinic with any problems, questions, or concerns.  Patient and plan discussed with Dr. Glenford Peers and he is in agreement with the aforementioned.  KEFALAS,THOMAS

## 2012-03-03 NOTE — Progress Notes (Signed)
Labs drawn today for cea,pt 

## 2012-03-03 NOTE — Patient Instructions (Addendum)
[facility]  Discharge Instructions  RECOMMENDATIONS MADE BY THE CONSULTANT AND ANY TEST RESULTS WILL BE SENT TO YOUR REFERRING DOCTOR.   EXAM FINDINGS BY MD TODAY AND SIGNS AND SYMPTOMS TO REPORT TO CLINIC OR PRIMARY MD: Exam per T. Kef alas PA  MEDICATIONS PRESCRIBED: decrease stool softener to 1 a day Lotrisone to anal area twice a day, desitin cream over that  INSTRUCTIONS GIVEN AND DISCUSSED: CT scan next week Nov 4 th at 845 am SPECIAL INSTRUCTIONS/FOLLOW-UP:    I acknowledge that I have been informed and understand all the instructions given to me and received a copy. I do not have any more questions at this time, but understand that I may call the Specialty Clinic at [facility] at [phone] during business hours should I have any further questions or need assistance in obtaining follow-up care.    __________________________________________  _____________  __________ Signature of Patient or Authorized Representative            Date                   Time    __________________________________________ Nurse's Signature

## 2012-03-04 LAB — CEA: CEA: 1 ng/mL (ref 0.0–5.0)

## 2012-03-08 ENCOUNTER — Telehealth (HOSPITAL_COMMUNITY): Payer: Self-pay

## 2012-03-08 ENCOUNTER — Ambulatory Visit (HOSPITAL_COMMUNITY)
Admission: RE | Admit: 2012-03-08 | Discharge: 2012-03-08 | Disposition: A | Discharge status: Home / Self Care | Payer: Self-pay | Source: Ambulatory Visit | Attending: Oncology | Admitting: Oncology

## 2012-03-08 ENCOUNTER — Telehealth: Payer: Self-pay | Admitting: Internal Medicine

## 2012-03-08 DIAGNOSIS — R933 Abnormal findings on diagnostic imaging of other parts of digestive tract: Secondary | ICD-10-CM | POA: Insufficient documentation

## 2012-03-08 DIAGNOSIS — K6289 Other specified diseases of anus and rectum: Secondary | ICD-10-CM | POA: Insufficient documentation

## 2012-03-08 DIAGNOSIS — C189 Malignant neoplasm of colon, unspecified: Secondary | ICD-10-CM | POA: Insufficient documentation

## 2012-03-08 MED ORDER — IOHEXOL 300 MG/ML  SOLN
100.0000 mL | Freq: Once | INTRAMUSCULAR | Status: AC | PRN
  Administered 2012-03-08: 100 mL via INTRAVENOUS

## 2012-03-08 NOTE — Telephone Encounter (Signed)
Discussed with Dr. Mariel Sleet. Patient's has abnormal thickening near the area of the prior ileostomy, presumably done at times original colon cancer surgery. I do not have the operative note for review at this time. I have reviewed the films with Dr. Tyron Russell. I agree with Dr. Mariel Sleet further evaluation of this area is warranted. We'll get him in the office ASAP and reassess.

## 2012-03-08 NOTE — Telephone Encounter (Signed)
Mrs. Raffield notified.

## 2012-03-08 NOTE — Telephone Encounter (Signed)
Call from patient.  Want to know if we have the results back from CTs done this am? States " Since I had the scan this morning my stomach and rectal area is burning and my rectum is raw.  I've been to the BR about 8 times since the scan and want to know what else I can do?"  Describes BM as loose and brown in color and has immodium but has not taken any.  Has also been using the lotrisone cream and desitin ointment as prescribed by PA last week.  Encouraged to drink lots of fluids and that we would be back in touch with him.

## 2012-03-08 NOTE — Telephone Encounter (Signed)
Pt is aware of OV with LSL on 11/6 at 230

## 2012-03-08 NOTE — Telephone Encounter (Signed)
Message copied by Evelena Leyden on Mon Mar 08, 2012  6:35 PM ------      Message from: Mariel Sleet, ERIC S      Created: Mon Mar 08, 2012  1:50 PM       Dr Jena Gauss is to review CT scan and will probably be in touch with him      If no message from by Wed Am call us back

## 2012-03-09 ENCOUNTER — Encounter: Payer: Self-pay | Admitting: Internal Medicine

## 2012-03-10 ENCOUNTER — Encounter: Payer: Self-pay | Admitting: Gastroenterology

## 2012-03-10 ENCOUNTER — Ambulatory Visit (INDEPENDENT_AMBULATORY_CARE_PROVIDER_SITE_OTHER): Payer: Self-pay | Admitting: Gastroenterology

## 2012-03-10 VITALS — BP 132/76 | HR 89 | Temp 98.1°F | Ht 74.0 in | Wt 221.4 lb

## 2012-03-10 DIAGNOSIS — C189 Malignant neoplasm of colon, unspecified: Secondary | ICD-10-CM

## 2012-03-10 MED ORDER — LIDOCAINE-HYDROCORTISONE ACE 3-0.5 % RE KIT: PACK | RECTAL | Status: DC

## 2012-03-10 NOTE — Progress Notes (Signed)
Primary Care Physician:  [name]., MD  Primary Gastroenterologist:    Chief Complaint  Patient presents with  . Follow-up    HPI:  Charles [name] is a 61 y.o. male here for further evaluation of abnormal CT scan done recently by Dr. Glenford Peers. Patient has history of stage III rectal cancer (diagnosed in 07/2010) status post resection, initially with ileostomy which was subsequently reversed in April 2013. He underwent adjuvant chemotherapy prior to surgery as well as afterwards. Completed in March of 2013. Recent CT scan showed focus of wall thickening at the site of small bowel anastomosis in the right midabdomen possibly due to inflammation, fibrosis, with tumor not excluded. Rectum was underdistended and unable to exclude wall thickening.  Patient states that after bag reversal, stools started returning to normal. Last couple of weeks he has had increasing problems. He has used diaper wipes instead of toilet tissue due to excessive irritation the perineum. Bleeding from skin around anus. Desitin/zinc oxide. Monday, he had so much irritation, he was screaming with BM, stool was solid. Lot of rectal burning/burning on outside too. A little better on Tuesday. BM 3 times today so far. Still using Desitin and Lotrisone BID (just started on Monday). Tried PrepH but didn't help. Two weeks of 8 bms daily soft stool. Incomplete evacuation. Normal 6-8 times per day but just little stool each time. No blood mixed in the stool. Two days a little abdominal pain, relieved with BM.   Patient states he was told it could take up to one year for his BMs to stabilize. Generally he has numerous small soft stools and feels like he never completes a BM. Rectal/perineum discomfort is new the last couple of weeks and started with episodes of frequent loose BMs.   Patient has not had f/u colonoscopy since his original diagnosis of rectal cancer in 07/2010.   Current Outpatient Prescriptions  Medication Sig  Dispense Refill  . ALPRAZolam (XANAX) 0.5 MG tablet Take 0.5 mg by mouth 3 (three) times daily. For anxiety      . clotrimazole-betamethasone (LOTRISONE) cream Apply topically 2 (two) times daily. Twice daily x 10 days.  Apply Desodan on top of this BID  30 g  0  . gabapentin (NEURONTIN) 300 MG capsule Take 300-900 mg by mouth 3 (three) times daily. 3 caps in am and 2 caps at noon and hs      . Tamsulosin HCl (FLOMAX) 0.4 MG CAPS Take by mouth.      . warfarin (COUMADIN) 4 MG tablet Take 2 tablets daily for a daily dose of 8 mg  60 tablet  2  . citalopram (CELEXA) 20 MG tablet Take 2 tablets (40 mg total) by mouth daily. For Depression  60 tablet  4  . dexlansoprazole (DEXILANT) 60 MG capsule Take 60 mg by mouth daily. Acid reflux      . docusate sodium (DULCOLAX) 100 MG capsule Take 100 mg by mouth 2 (two) times daily. 2 tabs in the morning and 1 in the evening      . omeprazole (PRILOSEC OTC) 20 MG tablet Take 20 mg by mouth daily.      . polyethylene glycol (MIRALAX / GLYCOLAX) packet Take 17 g by mouth daily as needed. Constipation      . potassium chloride SA (K-DUR,KLOR-CON) 20 MEQ tablet Take 40 mEq by mouth 2 (two) times daily.       . psyllium (METAMUCIL) 58.6 % packet Take 1 packet by mouth daily as  needed. Increase Fiber      . warfarin (COUMADIN) 1 MG tablet USE AS DIRECTED WITH 5MG  TABLET  100 tablet  2  . warfarin (COUMADIN) 5 MG tablet Take 5 mg by mouth daily. Current daily dose 8 mg.         Allergies as of 03/10/2012  . (No Known Allergies)    Past Medical History  Diagnosis Date  . HTN (hypertension)   . Pulmonary embolism   . GERD (gastroesophageal reflux disease)   . Depression 04/01/2011  . DVT (deep venous thrombosis) 05/09/2011  . 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 Colon cancer 07/24/10    rectal ca, inv adenocarcinoma  . Hx of radiation therapy 09/02/10 to 10/14/10    pelvis    Past Surgical History  Procedure Date  . Esophagogastroduodenoscopy 07/2010    RMR:  schatki ring s/p dilation, small hh, SB bx benign  . Colonoscopy 07/2010    proximal rectal apple core mass 10-14cm from anal verge (adenocarcinoma), 2-3cm distal rectal carpet polyp s/p piecemeal snare polypectomy (adenoma)  . Eus 08/2010    Dr. Rob Bunting. uT3N0 circumferential, nearly obstruction rectosigmoid adenocarcinoma, distal edge 12cm from anal verge  . Ivc filter   . Colon surgery 11/14/2010    proctectomy with colorectal anastomosis and diverting loop ileostomy (temporary planned)  . Port a cath placement   . Colostomy reversal april 2013  . Port-a-cath removal 09/24/2011    Procedure: REMOVAL PORT-A-CATH;  Surgeon: Fabio Bering, MD;  Location: [facility];  Service: General;  Laterality: N/A;  Minor Room  . Ivc filter     Family History  Problem Relation Age of Onset  . Colon cancer Neg Hx   . Liver disease Neg Hx   . Inflammatory bowel disease Neg Hx   . Cancer Brother     throat  . Cancer Brother     prostate    History   Social History  . Marital Status: Married    Spouse Name: N/A    Number of Children: 2  . Years of Education: N/A   Occupational History  . self-employed Merchandiser, retail, currently not working    Social History Main Topics  . Smoking status: Never Smoker   . Smokeless tobacco: Not on file  . Alcohol Use: No  . Drug Use: No  . Sexually Active: Not on file   Other Topics Concern  . Not on file   Social History Narrative  . No narrative on file      ROS:  General: Negative for anorexia, weight loss, fever, chills, fatigue, weakness. Eyes: Negative for vision changes.  ENT: Negative for hoarseness, difficulty swallowing , nasal congestion. CV: Negative for chest pain, angina, palpitations, dyspnea on exertion, peripheral edema.  Respiratory: Negative for dyspnea at rest, dyspnea on exertion, cough, sputum, wheezing.  GI: See history of present illness. GU:  Negative for dysuria, hematuria, urinary incontinence, urinary  frequency, nocturnal urination.  MS: Negative for joint pain, low back pain.  Derm: Negative for rash or itching.  Neuro: Negative for weakness, abnormal sensation, seizure, frequent headaches, memory loss, confusion.  Psych: Negative for anxiety, depression, suicidal ideation, hallucinations.  Endo: Negative for unusual weight change.  Heme: Negative for bruising or bleeding. Allergy: Negative for rash or hives.    Physical Examination:  BP 132/76  Pulse 89  Temp 98.1 F (36.7 C) (Temporal)  Ht 6\' 2"  (1.88 m)  Wt 221 lb 6.4 oz (100.426 kg)  BMI 28.43 kg/m2  General: Well-nourished, well-developed in no acute distress. Accompanied by wife.  Head: Normocephalic, atraumatic.   Eyes: Conjunctiva pink, no icterus. Mouth: Oropharyngeal mucosa moist and pink , no lesions erythema or exudate. Neck: Supple without thyromegaly, masses, or lymphadenopathy.  Lungs: Clear to auscultation bilaterally.  Heart: Regular rate and rhythm, no murmurs rubs or gallops.  Abdomen: Bowel sounds are normal, nontender, nondistended, no hepatosplenomegaly or masses, no abdominal bruits or    hernia , no rebound or guarding.   Rectal: examination of perineum shows excoriations surrounding anal opening, marked erythema. Internal exam not performed per patient's request and recently done by Dellis Anes PA. Extremities: No lower extremity edema. No clubbing or deformities.  Neuro: Alert and oriented x 4 , grossly normal neurologically.  Skin: Warm and dry, no rash or jaundice.   Psych: Alert and cooperative, normal mood and affect.  Labs: Lab Results  Component Value Date   CREATININE 0.85 03/03/2012   BUN 13 03/03/2012   NA 140 03/03/2012   K 4.7 03/03/2012   CL 105 03/03/2012   CO2 26 03/03/2012   Lab Results  Component Value Date   ALT 20 03/03/2012   AST 19 03/03/2012   ALKPHOS 95 03/03/2012   BILITOT 0.8 03/03/2012   Lab Results  Component Value Date   CEA 1.0 03/03/2012   Lab  Results  Component Value Date   INR 2.28* 03/03/2012   INR 2.52* 02/04/2012   INR 1.99* 01/14/2012     Imaging Studies: Ct Abdomen Pelvis W Contrast  03/08/2012  *RADIOLOGY REPORT*  Clinical Data: Colorectal cancer post surgery, chemotherapy and radiation therapy, colostomy reversal, stool leakage, past history hypertension, pulmonary embolism, GERD, high output ileostomy  CT ABDOMEN AND PELVIS WITH CONTRAST  Technique:  Multidetector CT imaging of the abdomen and pelvis was performed following the standard protocol during bolus administration of intravenous contrast. Sagittal and coronal MPR images reconstructed from axial data set.  Contrast: OMNIPAQUE IOHEXOL 300 MG/ML  SOLN Dilute oral contrast.  Comparison: 01/10/2011  Findings: Scarring right lung base. IVC filter. Stable calcification medial limb left adrenal gland. Tiny nodule inferior pole left kidney 11 x 11 x 8 mm stable since 07/31/2010. Liver, spleen, pancreas, kidneys, and adrenal glands otherwise normal. Unremarkable stomach and gallbladder. Mild stranding of presacral space grossly unchanged. Mild fatty dilatation of the proximal inguinal canals question inguinal hernias. Focus of wall thickening identified at the site of a small bowel anastomosis in the right mid abdomen, could represent inflammation, fibrosis or tumor. Rectum under distended, unable to exclude wall thickening. No definite mass, adenopathy, free fluid or inflammatory process. Bladder decompressed, grossly unremarkable. Bones appear diffusely demineralized. Multifactorial spinal stenosis at the L3-L4, L4-L5, L5-S1.  IMPRESSION: Focus of wall thickening at site of a small bowel anastomosis in the right mid abdomen, question inflammation, fibrosis or tumor. Stable post-therapy changes of the pelvis. No definite evidence of recurrent pelvic tumor. Question small bilateral fat containing inguinal hernias. Multilevel spinal stenosis at L3-L4, L4-L5, L5-S1.   Original Report  Authenticated By: Ulyses Southward, M.D.

## 2012-03-10 NOTE — Patient Instructions (Addendum)
For rectal pain, please apply anamantle internally twice a day for two weeks. I will discuss operative report with Dr. Jena Gauss and let you know his suggestions. You will likely need a colonoscopy at this time, especially given you have not had once since your cancer diagnosis. We will need to determine if you can hold coumadin for the procedure or if you will need to take Lovenox injections.

## 2012-03-11 NOTE — Progress Notes (Signed)
Patient ID: Charles [name], male   DOB: Mar 12, 1951, 60 y.o.   MRN: [id number] I called in Rx to Zena at [facility] for them to compound it. He said they would. I will call Pt and let them know after 3:30 because they in at a funeral right now. Pt is aware he will have to paid about $55.00 for the Rx.

## 2012-03-12 ENCOUNTER — Encounter: Payer: Self-pay | Admitting: Gastroenterology

## 2012-03-12 NOTE — Progress Notes (Signed)
Discussed with Dr. Jena Gauss.  Patient needs colonoscopy ASAP for abnormal CT findings.  He will need Lovenox bridge. Patient is uninsured, we need to try and get some assistance for medication expense.

## 2012-03-12 NOTE — Progress Notes (Signed)
Faxed to PCP

## 2012-03-12 NOTE — Assessment & Plan Note (Addendum)
H/O rectal cancer, stage III, diagnosed in 07/2010. S/P radiation/chemo/resection. Initial surgery was in 11/2010 included proctectomy with colorectal anastomosis and diverting loop ileostomy in [facility] by Dr. Rae Halsted. In 08/2011, he had reversal of ileostomy. Since that time he has had frequent soft, small BMs. Feels like he never completes a BM. Two weeks ago, developed more loose stool which is now improved but developed severe anorectal irritation as outlined above.   CT Abd/pelvis showed focus of wall thickening at site of a small bowel anastomosis in the right mid abdomen, question inflammation, fibrosis or tumor. Rectum not well distended for viewing.   Rectal pain, ?ulceration/proctitis/fissure? Will add anamantle, to be compounded at [facility], cheaper in setting of no medical insurance. Continue lotrisone in perineal area.   Patient will need a colonoscopy for CT findings above. He has not had one year f/u colonoscopy since cancer diagnosis. He is on coumadin for h/o DVT/PE. DVT still persistent on U/S in 09/2011. He will likely need Lovenox bridging.

## 2012-03-16 NOTE — Progress Notes (Signed)
pts wife is aware. She is requesting that we mail the pt assistance forms to her and she will bring them back to the office when she brings the pt to have his coumadin checked. She will also bring proof of income for [place] to make a copy of.

## 2012-03-16 NOTE — Progress Notes (Signed)
Forms have been sent to pt.

## 2012-03-22 NOTE — Progress Notes (Signed)
pts wife returned forms today and they are [facility] desk to complete.

## 2012-03-24 NOTE — Progress Notes (Signed)
Forms have been faxed to [facility] for pt assistance.

## 2012-03-31 ENCOUNTER — Other Ambulatory Visit (HOSPITAL_COMMUNITY): Payer: Self-pay | Admitting: Oncology

## 2012-03-31 ENCOUNTER — Encounter (HOSPITAL_COMMUNITY): Payer: Self-pay | Attending: Hematology and Oncology

## 2012-03-31 DIAGNOSIS — I82409 Acute embolism and thrombosis of unspecified deep veins of unspecified lower extremity: Secondary | ICD-10-CM | POA: Insufficient documentation

## 2012-03-31 LAB — PROTIME-INR
INR: 2.24 — ABNORMAL HIGH (ref 0.00–1.49)
Prothrombin Time: 23.8 seconds — ABNORMAL HIGH (ref 11.6–15.2)

## 2012-03-31 NOTE — Progress Notes (Signed)
Labs drawn today for pt 

## 2012-03-31 NOTE — Progress Notes (Signed)
Faxed pt assistance paper work to [employer] again.

## 2012-04-02 ENCOUNTER — Other Ambulatory Visit (HOSPITAL_COMMUNITY): Payer: Self-pay | Admitting: Oncology

## 2012-04-05 ENCOUNTER — Other Ambulatory Visit (HOSPITAL_COMMUNITY): Payer: Self-pay

## 2012-04-05 MED ORDER — GABAPENTIN 300 MG PO CAPS: 300.0000 mg | ORAL_CAPSULE | Freq: Three times a day (TID) | ORAL | Status: DC

## 2012-04-05 MED ORDER — ALPRAZOLAM 0.5 MG PO TABS: 0.5000 mg | ORAL_TABLET | Freq: Three times a day (TID) | ORAL | Status: DC

## 2012-04-05 NOTE — Progress Notes (Signed)
Faxed paper work to [employer]

## 2012-04-06 NOTE — Progress Notes (Signed)
Spoke with Cordelia Pen at [employer], the paperwork we sent in was the old paperwork and we need to fill out the new paperwork. She faxed me the new paperwork, I have transferred all the information to the new paperwork. I need LSL signature on it and pts signature. Called pt, spoke with his wife, apologized for the confusion and asked that he come by and sign the paperwork so we could send it asap. She stated she would get him to come by tomorrow. Left paper work at the front desk with Darl Pikes, highlighted area to be signed after pt checks for errors. Will have LSL sign when she is in the office tomorrow.

## 2012-04-07 ENCOUNTER — Other Ambulatory Visit: Payer: Self-pay | Admitting: Internal Medicine

## 2012-04-07 MED ORDER — PEG 3350-KCL-NA BICARB-NACL 420 G PO SOLR: 4000.0000 mL | ORAL | Status: DC

## 2012-04-07 NOTE — Progress Notes (Signed)
Pt came in and signed paperwork and it has been faxed to the company

## 2012-04-07 NOTE — Progress Notes (Signed)
We need to get him on the schedule for colonoscopy as a we know when he has the Lovenox in hand. He will be past his 30 days but maybe Dr. Jena Gauss will make an exception given the circumstances.

## 2012-04-07 NOTE — Progress Notes (Signed)
I have patient scheduled with RMR on 12/12 @11 :30 and patients wife is aware

## 2012-04-08 ENCOUNTER — Other Ambulatory Visit: Payer: Self-pay | Admitting: Internal Medicine

## 2012-04-08 NOTE — Progress Notes (Signed)
Lovenox bridging instructions: for TCS scheduled for 04/15/12 at 11:30. Last dose of coumadin on 04/11/12. First dose of Lovenox on 04/12/12, [place] at 10am. Continue daily with last dose on 04/14/12 at 10am. Dr. Jena Gauss to tell patient when to resume coumadin and Lovenox after colonoscopy.

## 2012-04-08 NOTE — Progress Notes (Signed)
Routing to LSL for Lovenox Instructions

## 2012-04-08 NOTE — Progress Notes (Signed)
Instructions have been mailed to Charles [name] and I have also talked to his wife

## 2012-04-12 NOTE — Progress Notes (Signed)
Called [employer] about pt assistance. They are still working on approval and they will let us know asap. I spoke with Patty and asked her if she thought we would have it in time for pts procedure on Friday? She stated she didn't think we would have it by then. We may need to reschedule pts procedure. LSL what do you want [place] to do? Please advise.

## 2012-04-13 ENCOUNTER — Other Ambulatory Visit: Payer: Self-pay | Admitting: Gastroenterology

## 2012-04-13 MED ORDER — ENOXAPARIN SODIUM 150 MG/ML ~~LOC~~ SOLN: 150.0000 mg | Freq: Every day | SUBCUTANEOUS | Status: DC

## 2012-04-13 NOTE — Progress Notes (Signed)
Procedure has been cx for the time being

## 2012-04-13 NOTE — Progress Notes (Signed)
Unfortunately patient needs the lovenox and is uninsured. We will have to reschedule his procedures if lovenox not available yet.

## 2012-04-13 NOTE — Progress Notes (Signed)
Received email from [place] pt assistance. Pt is approved but they need a new rx faxed to [phone]

## 2012-04-13 NOTE — Progress Notes (Signed)
rx faxed to [employer] pt assistance.

## 2012-04-13 NOTE — Progress Notes (Signed)
New rx has been faxed to [facility] pt assistance.

## 2012-04-14 ENCOUNTER — Encounter: Payer: Self-pay | Admitting: Internal Medicine

## 2012-04-14 ENCOUNTER — Other Ambulatory Visit: Payer: Self-pay | Admitting: Internal Medicine

## 2012-04-14 ENCOUNTER — Encounter (HOSPITAL_COMMUNITY): Payer: Self-pay | Admitting: Pharmacy Technician

## 2012-04-14 DIAGNOSIS — Z85038 Personal history of other malignant neoplasm of large intestine: Secondary | ICD-10-CM

## 2012-04-14 NOTE — Progress Notes (Signed)
Patient is scheduled with RMR on 12/18 and new instructions have been given

## 2012-04-14 NOTE — Progress Notes (Signed)
lovenox injections came in the mail today. Pt has been notified and Benedetto Goad is going to reschedule pt.

## 2012-04-15 ENCOUNTER — Encounter (HOSPITAL_COMMUNITY): Admission: RE | Payer: Self-pay | Source: Ambulatory Visit

## 2012-04-15 ENCOUNTER — Ambulatory Visit (HOSPITAL_COMMUNITY): Admission: RE | Admit: 2012-04-15 | Payer: Self-pay | Source: Ambulatory Visit | Admitting: Internal Medicine

## 2012-04-15 SURGERY — COLONOSCOPY
Anesthesia: Moderate Sedation

## 2012-04-15 NOTE — Progress Notes (Signed)
Reviewed instructions. Agree.

## 2012-04-20 MED ORDER — SODIUM CHLORIDE 0.45 % IV SOLN
INTRAVENOUS | Status: DC
  Administered 2012-04-21: 10:00:00 via INTRAVENOUS

## 2012-04-21 ENCOUNTER — Encounter (HOSPITAL_COMMUNITY): Payer: Self-pay | Admitting: *Deleted

## 2012-04-21 ENCOUNTER — Ambulatory Visit (HOSPITAL_COMMUNITY)
Admission: RE | Admit: 2012-04-21 | Discharge: 2012-04-21 | Disposition: A | Discharge status: Home / Self Care | Payer: Self-pay | Source: Ambulatory Visit | Attending: Internal Medicine | Admitting: Internal Medicine

## 2012-04-21 ENCOUNTER — Encounter (HOSPITAL_COMMUNITY)
Admission: RE | Disposition: A | Discharge status: Home / Self Care | Payer: Self-pay | Source: Ambulatory Visit | Attending: Internal Medicine

## 2012-04-21 DIAGNOSIS — I10 Essential (primary) hypertension: Secondary | ICD-10-CM | POA: Insufficient documentation

## 2012-04-21 DIAGNOSIS — Z09 Encounter for follow-up examination after completed treatment for conditions other than malignant neoplasm: Secondary | ICD-10-CM | POA: Insufficient documentation

## 2012-04-21 DIAGNOSIS — Z85038 Personal history of other malignant neoplasm of large intestine: Secondary | ICD-10-CM | POA: Insufficient documentation

## 2012-04-21 DIAGNOSIS — K6389 Other specified diseases of intestine: Secondary | ICD-10-CM

## 2012-04-21 HISTORY — PX: COLONOSCOPY: SHX5424

## 2012-04-21 SURGERY — COLONOSCOPY
Anesthesia: Moderate Sedation

## 2012-04-21 MED ORDER — MIDAZOLAM HCL 5 MG/5ML IJ SOLN
INTRAMUSCULAR | Status: AC
  Filled 2012-04-21: qty 10

## 2012-04-21 MED ORDER — MEPERIDINE HCL 100 MG/ML IJ SOLN
INTRAMUSCULAR | Status: AC
  Filled 2012-04-21: qty 1

## 2012-04-21 MED ORDER — MIDAZOLAM HCL 5 MG/5ML IJ SOLN
INTRAMUSCULAR | Status: DC | PRN
  Administered 2012-04-21: 1 mg via INTRAVENOUS
  Administered 2012-04-21 (×3): 2 mg via INTRAVENOUS
  Administered 2012-04-21: 1 mg via INTRAVENOUS
  Administered 2012-04-21: 2 mg via INTRAVENOUS

## 2012-04-21 MED ORDER — STERILE WATER FOR IRRIGATION IR SOLN
Status: DC | PRN
  Administered 2012-04-21: 11:00:00

## 2012-04-21 MED ORDER — LIDOCAINE HCL 2 % EX GEL
CUTANEOUS | Status: AC
  Filled 2012-04-21: qty 30

## 2012-04-21 MED ORDER — MEPERIDINE HCL 100 MG/ML IJ SOLN
INTRAMUSCULAR | Status: DC | PRN
  Administered 2012-04-21: 25 mg via INTRAVENOUS
  Administered 2012-04-21: 50 mg via INTRAVENOUS
  Administered 2012-04-21: 25 mg via INTRAVENOUS
  Administered 2012-04-21: 50 mg via INTRAVENOUS

## 2012-04-21 NOTE — Op Note (Signed)
[facility] [address] [place], [address]   COLONOSCOPY PROCEDURE REPORT  PATIENT: Charles [name], Charles [name]  MR#:         [id number] BIRTHDATE: 27-Feb-1951 , 61  yrs. old GENDER: Male ENDOSCOPIST: R.  Roetta Sessions, MD FACP FACG REFERRED BY:  Doreen Beam, M.D.  Glenford Peers, M.D.   Dr. Rae Halsted PROCEDURE DATE:  04/21/2012 PROCEDURE:     Colonoscopy with biopsy  INDICATIONS: history of low anterior resection for colorectal cancer. This is his first followup colonoscopy after surgery. This is also his first complete colonoscopy.  Peri-anal symptoms have resolved with topical therapy prescribed through our office.  INFORMED CONSENT:  The risks, benefits, alternatives and imponderables including but not limited to bleeding, perforation as well as the possibility of a missed lesion have been reviewed.  The potential for biopsy, lesion removal, etc. have also been discussed.  Questions have been answered.  All parties agreeable. Please see the history and physical in the medical record for more information.  MEDICATIONS: Versed 10 mg IV and Demerol 150 mg IV in divided doses.  DESCRIPTION OF PROCEDURE:  After a digital rectal exam was performed, the Pentax Colonoscope [id number]  colonoscope was advanced from the anus through the rectum and colon to the area of the cecum, ileocecal valve and appendiceal orifice.  The cecum was deeply intubated.  These structures were well-seen and photographed for the record.  From the level of the cecum and ileocecal valve, the scope was slowly and cautiously withdrawn.  The mucosal surfaces were carefully surveyed utilizing scope tip deflection to facilitate fold flattening as needed.  The scope was pulled down into the rectum where a thorough examination was performed.    FINDINGS:  Adequate preparation. Surgical anastomosis at 10-12 cm from the anal verge. Rectal lumen was narrowed through this segment with marked friability  and fibrotic changes. I did not see anything really suspicious for recurrent neoplasm. Rectal vault small; unable to retroflex; however, the rectum  was seen well on-face. The residual colonic mucosa to the cecum appeared normal.  THERAPEUTIC / DIAGNOSTIC MANEUVERS PERFORMED:  The fibrotic, friable mucosa at the anastomosis was biopsied for histologic study.  COMPLICATIONS: None  CECAL WITHDRAWAL TIME:  9 minutes  IMPRESSION:  Friable,fibrotic appearing colorectal anastomosis producing some luminal narrowing-not felt to be critical. Status post biopsy. Otherwise, the remainder of the residual rectal and colonic mucosa appeared normal.  RECOMMENDATIONS: Followup on pathology. Further recommendations to follow.   _______________________________ eSigned:  R. Roetta Sessions, MD FACP [facility] 04/21/2012 11:59 AM   CC:

## 2012-04-21 NOTE — H&P (Signed)
Primary Gastroenterologist:  Chief Complaint   Patient presents with   .  Follow-up    HPI: Charles [name] is a 61 y.o. male here for further evaluation of abnormal CT scan done recently by Dr. Glenford Peers. Patient has history of stage III rectal cancer (diagnosed in 07/2010) status post resection, initially with ileostomy which was subsequently reversed in April 2013. He underwent adjuvant chemotherapy prior to surgery as well as afterwards. Completed in March of 2013. Recent CT scan showed focus of wall thickening at the site of small bowel anastomosis in the right midabdomen possibly due to inflammation, fibrosis, with tumor not excluded. Rectum was underdistended and unable to exclude wall thickening.  Patient states that after bag reversal, stools started returning to normal. Last couple of weeks he has had increasing problems. He has used diaper wipes instead of toilet tissue due to excessive irritation the perineum. Bleeding from skin around anus. Desitin/zinc oxide. Monday, he had so much irritation, he was screaming with BM, stool was solid. Lot of rectal burning/burning on outside too. A little better on Tuesday. BM 3 times today so far. Still using Desitin and Lotrisone BID (just started on Monday). Tried PrepH but didn't help. Two weeks of 8 bms daily soft stool. Incomplete evacuation. Normal 6-8 times per day but just little stool each time. No blood mixed in the stool. Two days a little abdominal pain, relieved with BM.  Patient states he was told it could take up to one year for his BMs to stabilize. Generally he has numerous small soft stools and feels like he never completes a BM. Rectal/perineum discomfort is new the last couple of weeks and started with episodes of frequent loose BMs.  Patient has not had f/u colonoscopy since his original diagnosis of rectal cancer in 07/2010.  Current Outpatient Prescriptions   Medication  Sig  Dispense  Refill   .  ALPRAZolam (XANAX) 0.5 MG  tablet  Take 0.5 mg by mouth 3 (three) times daily. For anxiety     .  clotrimazole-betamethasone (LOTRISONE) cream  Apply topically 2 (two) times daily. Twice daily x 10 days. Apply Desodan on top of this BID  30 g  0   .  gabapentin (NEURONTIN) 300 MG capsule  Take 300-900 mg by mouth 3 (three) times daily. 3 caps in am and 2 caps at noon and hs     .  Tamsulosin HCl (FLOMAX) 0.4 MG CAPS  Take by mouth.     .  warfarin (COUMADIN) 4 MG tablet  Take 2 tablets daily for a daily dose of 8 mg  60 tablet  2   .  citalopram (CELEXA) 20 MG tablet  Take 2 tablets (40 mg total) by mouth daily. For Depression  60 tablet  4   .  dexlansoprazole (DEXILANT) 60 MG capsule  Take 60 mg by mouth daily. Acid reflux     .  docusate sodium (DULCOLAX) 100 MG capsule  Take 100 mg by mouth 2 (two) times daily. 2 tabs in the morning and 1 in the evening     .  omeprazole (PRILOSEC OTC) 20 MG tablet  Take 20 mg by mouth daily.     .  polyethylene glycol (MIRALAX / GLYCOLAX) packet  Take 17 g by mouth daily as needed. Constipation     .  potassium chloride SA (K-DUR,KLOR-CON) 20 MEQ tablet  Take 40 mEq by mouth 2 (two) times daily.     .  psyllium (METAMUCIL)  58.6 % packet  Take 1 packet by mouth daily as needed. Increase Fiber     .  warfarin (COUMADIN) 1 MG tablet  USE AS DIRECTED WITH 5MG  TABLET  100 tablet  2   .  warfarin (COUMADIN) 5 MG tablet  Take 5 mg by mouth daily. Current daily dose 8 mg.      Allergies as of 03/10/2012   .  (No Known Allergies)    Past Medical History   Diagnosis  Date   .  HTN (hypertension)    .  Pulmonary embolism    .  GERD (gastroesophageal reflux disease)    .  Depression  04/01/2011   .  DVT (deep venous thrombosis)  05/09/2011   .  High output ileostomy  05/21/2011   .  Colon cancer  07/24/10     rectal ca, inv adenocarcinoma   .  Hx of radiation therapy  09/02/10 to 10/14/10     pelvis    Past Surgical History   Procedure  Date   .  Esophagogastroduodenoscopy  07/2010      RMR: schatki ring s/p dilation, small hh, SB bx benign   .  Colonoscopy  07/2010     proximal rectal apple core mass 10-14cm from anal verge (adenocarcinoma), 2-3cm distal rectal carpet polyp s/p piecemeal snare polypectomy (adenoma)   .  Eus  08/2010     Dr. Rob Bunting. uT3N0 circumferential, nearly obstruction rectosigmoid adenocarcinoma, distal edge 12cm from anal verge   .  Ivc filter    .  Colon surgery  11/14/2010     proctectomy with colorectal anastomosis and diverting loop ileostomy (temporary planned)   .  Port a cath placement    .  Colostomy reversal  april 2013   .  Port-a-cath removal  09/24/2011     Procedure: REMOVAL PORT-A-CATH; Surgeon: Fabio Bering, MD; Location: [facility]; Service: General; Laterality: N/A; Minor Room   .  Ivc filter     Family History   Problem  Relation  Age of Onset   .  Colon cancer  Neg Hx    .  Liver disease  Neg Hx    .  Inflammatory bowel disease  Neg Hx    .  Cancer  Brother       throat    .  Cancer  Brother       prostate    History    Social History   .  Marital Status:  Married     Spouse Name:  N/A     Number of Children:  2   .  Years of Education:  N/A    Occupational History   .  self-employed Merchandiser, retail, currently not working     Social History Main Topics   .  Smoking status:  Never Smoker   .  Smokeless tobacco:  Not on file   .  Alcohol Use:  No   .  Drug Use:  No   .  Sexually Active:  Not on file    Other Topics  Concern   .  Not on file    Social History Narrative   .  No narrative on file    ROS:  General: Negative for anorexia, weight loss, fever, chills, fatigue, weakness.  Eyes: Negative for vision changes.  ENT: Negative for hoarseness, difficulty swallowing , nasal congestion.  CV: Negative for chest pain, angina, palpitations, dyspnea on exertion, peripheral edema.  Respiratory: Negative  for dyspnea at rest, dyspnea on exertion, cough, sputum, wheezing.  GI: See history of  present illness.  GU: Negative for dysuria, hematuria, urinary incontinence, urinary frequency, nocturnal urination.  MS: Negative for joint pain, low back pain.  Derm: Negative for rash or itching.  Neuro: Negative for weakness, abnormal sensation, seizure, frequent headaches, memory loss, confusion.  Psych: Negative for anxiety, depression, suicidal ideation, hallucinations.  Endo: Negative for unusual weight change.  Heme: Negative for bruising or bleeding.  Allergy: Negative for rash or hives.  Physical Examination:  General: Well-nourished, well-developed in no acute distress. Accompanied by wife.  Head: Normocephalic, atraumatic.  Eyes: Conjunctiva pink, no icterus.  Mouth: Oropharyngeal mucosa moist and pink , no lesions erythema or exudate.  Neck: Supple without thyromegaly, masses, or lymphadenopathy.  Lungs: Clear to auscultation bilaterally.  Heart: Regular rate and rhythm, no murmurs rubs or gallops.  Abdomen: Bowel sounds are normal, nontender, nondistended, no hepatosplenomegaly or masses, no abdominal bruits or hernia , no rebound or guarding.  Extremities: No lower extremity edema. No clubbing or deformities.  Neuro: Alert and oriented x 4 , grossly normal neurologically.  Skin: Warm and dry, no rash or jaundice.  Psych: Alert and cooperative, normal mood and affect.  Labs:  Lab Results   Component  Value  Date    CREATININE  0.85  03/03/2012    BUN  13  03/03/2012    NA  140  03/03/2012    K  4.7  03/03/2012    CL  105  03/03/2012    CO2  26  03/03/2012    Lab Results   Component  Value  Date    ALT  20  03/03/2012    AST  19  03/03/2012    ALKPHOS  95  03/03/2012    BILITOT  0.8  03/03/2012    Lab Results   Component  Value  Date    CEA  1.0  03/03/2012    Lab Results   Component  Value  Date    INR  2.28*  03/03/2012    INR  2.52*  02/04/2012    INR  1.99*  01/14/2012    Imaging Studies:  Ct Abdomen Pelvis W Contrast  03/08/2012 *RADIOLOGY REPORT*  Clinical Data: Colorectal cancer post surgery, chemotherapy and radiation therapy, colostomy reversal, stool leakage, past history hypertension, pulmonary embolism, GERD, high output ileostomy CT ABDOMEN AND PELVIS WITH CONTRAST Technique: Multidetector CT imaging of the abdomen and pelvis was performed following the standard protocol during bolus administration of intravenous contrast. Sagittal and coronal MPR images reconstructed from axial data set. Contrast: OMNIPAQUE IOHEXOL 300 MG/ML SOLN Dilute oral contrast. Comparison: 01/10/2011 Findings: Scarring right lung base. IVC filter. Stable calcification medial limb left adrenal gland. Tiny nodule inferior pole left kidney 11 x 11 x 8 mm stable since 07/31/2010. Liver, spleen, pancreas, kidneys, and adrenal glands otherwise normal. Unremarkable stomach and gallbladder. Mild stranding of presacral space grossly unchanged. Mild fatty dilatation of the proximal inguinal canals question inguinal hernias. Focus of wall thickening identified at the site of a small bowel anastomosis in the right mid abdomen, could represent inflammation, fibrosis or tumor. Rectum under distended, unable to exclude wall thickening. No definite mass, adenopathy, free fluid or inflammatory process. Bladder decompressed, grossly unremarkable. Bones appear diffusely demineralized. Multifactorial spinal stenosis at the L3-L4, L4-L5, L5-S1. IMPRESSION: Focus of wall thickening at site of a small bowel anastomosis in the right mid abdomen, question inflammation, fibrosis or tumor. Stable  post-therapy changes of the pelvis. No definite evidence of recurrent pelvic tumor. Question small bilateral fat containing inguinal hernias. Multilevel spinal stenosis at L3-L4, L4-L5, L5-S1. Original Report Authenticated By: Ulyses Southward, M.D.  Patient needs colonoscopy ASAP for abnormal CT findings.  He will need Lovenox bridge. Patient is uninsured, we need to try and get some assistance for medication  expense.  Impression/recommendations:  H/O rectal cancer, stage III, diagnosed in 07/2010. S/P radiation/chemo/resection. Initial surgery was in 11/2010 included proctectomy with colorectal anastomosis and diverting loop ileostomy in [facility] by Dr. Rae Halsted. In 08/2011, he had reversal of ileostomy. CT Abd/pelvis showed focus of wall thickening at site of a small bowel anastomosis in the right mid abdomen, question inflammation, fibrosis or tumor. Rectum not well distended for viewing.  Patient will need a colonoscopy for CT findings above. He has not had one year f/u colonoscopy since cancer diagnosis. He is on coumadin for h/o DVT/PE. DVT still persistent on U/S in 09/2011. Bridged with Lovenox. Last Coumadin December 14  Last Lovenox December 17.  The risks, benefits, limitations, alternatives and imponderables have been reviewed with the patient. Questions have been answered. All parties are agreeable.

## 2012-04-23 ENCOUNTER — Telehealth (HOSPITAL_COMMUNITY): Payer: Self-pay

## 2012-04-23 ENCOUNTER — Encounter (HOSPITAL_COMMUNITY): Payer: Self-pay | Admitting: Internal Medicine

## 2012-04-23 NOTE — Telephone Encounter (Signed)
Call from patient's wife. Stated "Layth had his colonoscopy yesterday and he was told to restart his coumadin and to resume his lovenox yesterday. This morning he's having some dizziness and we were wondering if the lovenox and coumadin could be causing it."  Lovenox and coumadin was ordered by Tana Coast with GI.  Instructed Mrs. Omdahl to contact Dr. Luvenia Starch office to discuss the dizziness since he had the colonoscopy yesterday & it could be related to the procedure.  Verbalized understanding.

## 2012-04-26 ENCOUNTER — Encounter (HOSPITAL_COMMUNITY): Payer: Self-pay | Attending: Hematology and Oncology

## 2012-04-26 DIAGNOSIS — I82409 Acute embolism and thrombosis of unspecified deep veins of unspecified lower extremity: Secondary | ICD-10-CM

## 2012-04-26 LAB — PROTIME-INR
INR: 1.9 — ABNORMAL HIGH (ref 0.00–1.49)
Prothrombin Time: 21.1 seconds — ABNORMAL HIGH (ref 11.6–15.2)

## 2012-04-26 NOTE — Progress Notes (Signed)
Patient's wife instructed to take Lovenox injections until we see what the INR is on Friday. Patient to continue taking Coumadin 8mg  daily. Return Friday for INR. Wife verbalized understanding.

## 2012-04-26 NOTE — Progress Notes (Signed)
Charles [name] presented for labwork. Labs per MD order drawn via Peripheral Line 25 gauge needle inserted in rt ac.  Good blood return present. Procedure without incident.  Needle removed intact. Patient tolerated procedure well.

## 2012-04-27 ENCOUNTER — Other Ambulatory Visit (HOSPITAL_COMMUNITY): Payer: Self-pay

## 2012-04-30 ENCOUNTER — Encounter (HOSPITAL_BASED_OUTPATIENT_CLINIC_OR_DEPARTMENT_OTHER): Payer: Self-pay

## 2012-04-30 ENCOUNTER — Other Ambulatory Visit (HOSPITAL_COMMUNITY): Payer: Self-pay | Admitting: Oncology

## 2012-04-30 DIAGNOSIS — I82409 Acute embolism and thrombosis of unspecified deep veins of unspecified lower extremity: Secondary | ICD-10-CM

## 2012-04-30 LAB — PROTIME-INR
INR: 1.77 — ABNORMAL HIGH (ref 0.00–1.49)
Prothrombin Time: 20 seconds — ABNORMAL HIGH (ref 11.6–15.2)

## 2012-04-30 NOTE — Progress Notes (Signed)
Labs drawn today for pt 

## 2012-05-03 ENCOUNTER — Encounter: Payer: Self-pay | Admitting: Internal Medicine

## 2012-05-03 ENCOUNTER — Encounter (HOSPITAL_BASED_OUTPATIENT_CLINIC_OR_DEPARTMENT_OTHER): Payer: Self-pay

## 2012-05-03 ENCOUNTER — Other Ambulatory Visit (HOSPITAL_COMMUNITY): Payer: Self-pay | Admitting: Oncology

## 2012-05-03 ENCOUNTER — Encounter: Payer: Self-pay | Admitting: *Deleted

## 2012-05-03 DIAGNOSIS — I82409 Acute embolism and thrombosis of unspecified deep veins of unspecified lower extremity: Secondary | ICD-10-CM

## 2012-05-03 LAB — PROTIME-INR
INR: 2.33 — ABNORMAL HIGH (ref 0.00–1.49)
Prothrombin Time: 24.5 seconds — ABNORMAL HIGH (ref 11.6–15.2)

## 2012-05-03 NOTE — Progress Notes (Signed)
Labs drawn today for pt 

## 2012-05-06 ENCOUNTER — Telehealth (HOSPITAL_COMMUNITY): Payer: Self-pay | Admitting: *Deleted

## 2012-05-06 ENCOUNTER — Other Ambulatory Visit (HOSPITAL_COMMUNITY): Payer: Self-pay | Admitting: Oncology

## 2012-05-06 NOTE — Telephone Encounter (Signed)
Pt is out of coumadin. Needs 4 mg tablets ( takes 2 daily).

## 2012-05-10 ENCOUNTER — Encounter (HOSPITAL_COMMUNITY): Payer: BC Managed Care – PPO | Attending: Hematology and Oncology

## 2012-05-10 ENCOUNTER — Other Ambulatory Visit (HOSPITAL_COMMUNITY): Payer: Self-pay | Admitting: Oncology

## 2012-05-10 DIAGNOSIS — I82409 Acute embolism and thrombosis of unspecified deep veins of unspecified lower extremity: Secondary | ICD-10-CM | POA: Insufficient documentation

## 2012-05-10 DIAGNOSIS — C189 Malignant neoplasm of colon, unspecified: Secondary | ICD-10-CM | POA: Insufficient documentation

## 2012-05-10 DIAGNOSIS — D509 Iron deficiency anemia, unspecified: Secondary | ICD-10-CM | POA: Insufficient documentation

## 2012-05-10 LAB — PROTIME-INR
INR: 1.66 — ABNORMAL HIGH (ref 0.00–1.49)
Prothrombin Time: 19.1 seconds — ABNORMAL HIGH (ref 11.6–15.2)

## 2012-05-10 NOTE — Progress Notes (Signed)
Spoke with [name] & instructed that Charles [name] to increase his coumadin to 8mg  alternating with 9mg  and to return for next PT/INR on 05/18/12.  Verbalizes understanding.

## 2012-05-10 NOTE — Progress Notes (Signed)
Labs drawn today for pt 

## 2012-05-18 ENCOUNTER — Other Ambulatory Visit (HOSPITAL_COMMUNITY): Payer: Self-pay | Admitting: Oncology

## 2012-05-18 ENCOUNTER — Encounter (HOSPITAL_BASED_OUTPATIENT_CLINIC_OR_DEPARTMENT_OTHER): Payer: BC Managed Care – PPO

## 2012-05-18 ENCOUNTER — Ambulatory Visit (HOSPITAL_COMMUNITY)
Admission: RE | Admit: 2012-05-18 | Discharge: 2012-05-18 | Disposition: A | Discharge status: Home / Self Care | Payer: Self-pay | Source: Ambulatory Visit | Attending: Oncology | Admitting: Oncology

## 2012-05-18 DIAGNOSIS — I82409 Acute embolism and thrombosis of unspecified deep veins of unspecified lower extremity: Secondary | ICD-10-CM

## 2012-05-18 DIAGNOSIS — Z7901 Long term (current) use of anticoagulants: Secondary | ICD-10-CM | POA: Insufficient documentation

## 2012-05-18 DIAGNOSIS — D509 Iron deficiency anemia, unspecified: Secondary | ICD-10-CM

## 2012-05-18 DIAGNOSIS — C189 Malignant neoplasm of colon, unspecified: Secondary | ICD-10-CM

## 2012-05-18 DIAGNOSIS — I825Y9 Chronic embolism and thrombosis of unspecified deep veins of unspecified proximal lower extremity: Secondary | ICD-10-CM | POA: Insufficient documentation

## 2012-05-18 LAB — DIFFERENTIAL
Basophils Absolute: 0 10*3/uL (ref 0.0–0.1)
Basophils Relative: 1 % (ref 0–1)
Eosinophils Absolute: 0.1 10*3/uL (ref 0.0–0.7)
Eosinophils Relative: 2 % (ref 0–5)
Lymphocytes Relative: 39 % (ref 12–46)
Lymphs Abs: 2.4 10*3/uL (ref 0.7–4.0)
Monocytes Absolute: 0.5 10*3/uL (ref 0.1–1.0)
Monocytes Relative: 8 % (ref 3–12)
Neutro Abs: 3.1 10*3/uL (ref 1.7–7.7)
Neutrophils Relative %: 51 % (ref 43–77)

## 2012-05-18 LAB — PROTIME-INR
INR: 4.36 — ABNORMAL HIGH (ref 0.00–1.49)
Prothrombin Time: 39 seconds — ABNORMAL HIGH (ref 11.6–15.2)

## 2012-05-18 LAB — CBC
HCT: 31.3 % — ABNORMAL LOW (ref 39.0–52.0)
Hemoglobin: 9.3 g/dL — ABNORMAL LOW (ref 13.0–17.0)
MCH: 19.6 pg — ABNORMAL LOW (ref 26.0–34.0)
MCHC: 29.7 g/dL — ABNORMAL LOW (ref 30.0–36.0)
MCV: 65.9 fL — ABNORMAL LOW (ref 78.0–100.0)
Platelets: 328 10*3/uL (ref 150–400)
RBC: 4.75 MIL/uL (ref 4.22–5.81)
RDW: 18.1 % — ABNORMAL HIGH (ref 11.5–15.5)
WBC: 6 10*3/uL (ref 4.0–10.5)

## 2012-05-18 LAB — BASIC METABOLIC PANEL
BUN: 9 mg/dL (ref 6–23)
CO2: 26 mEq/L (ref 19–32)
Calcium: 8.7 mg/dL (ref 8.4–10.5)
Chloride: 105 mEq/L (ref 96–112)
Creatinine, Ser: 0.87 mg/dL (ref 0.50–1.35)
GFR calc Af Amer: >90 mL/min (ref >90)
GFR calc non Af Amer: >90 mL/min (ref >90)
Glucose, Bld: 148 mg/dL — ABNORMAL HIGH (ref 70–99)
Potassium: 3.7 mEq/L (ref 3.5–5.1)
Sodium: 139 mEq/L (ref 135–145)

## 2012-05-18 LAB — CEA: CEA: 1 ng/mL (ref 0.0–5.0)

## 2012-05-18 NOTE — Addendum Note (Signed)
Addended by: Dennie Maizes on: 05/18/2012 02:41 PM   Modules accepted: Orders

## 2012-05-18 NOTE — Progress Notes (Signed)
Labs drawn today for cea,cbc/diff,bmp,pt

## 2012-05-21 ENCOUNTER — Encounter (HOSPITAL_BASED_OUTPATIENT_CLINIC_OR_DEPARTMENT_OTHER): Payer: BC Managed Care – PPO | Admitting: Oncology

## 2012-05-21 ENCOUNTER — Encounter (HOSPITAL_COMMUNITY): Payer: Self-pay | Admitting: Oncology

## 2012-05-21 ENCOUNTER — Encounter (HOSPITAL_BASED_OUTPATIENT_CLINIC_OR_DEPARTMENT_OTHER): Payer: BC Managed Care – PPO

## 2012-05-21 VITALS — BP 138/81 | HR 83 | Temp 97.4°F | Resp 16 | Wt 227.8 lb

## 2012-05-21 DIAGNOSIS — I82409 Acute embolism and thrombosis of unspecified deep veins of unspecified lower extremity: Secondary | ICD-10-CM

## 2012-05-21 DIAGNOSIS — N529 Male erectile dysfunction, unspecified: Secondary | ICD-10-CM

## 2012-05-21 DIAGNOSIS — D509 Iron deficiency anemia, unspecified: Secondary | ICD-10-CM

## 2012-05-21 DIAGNOSIS — C189 Malignant neoplasm of colon, unspecified: Secondary | ICD-10-CM

## 2012-05-21 LAB — CBC
HCT: 31.3 % — ABNORMAL LOW (ref 39.0–52.0)
Hemoglobin: 9.3 g/dL — ABNORMAL LOW (ref 13.0–17.0)
MCH: 19.7 pg — ABNORMAL LOW (ref 26.0–34.0)
MCHC: 29.7 g/dL — ABNORMAL LOW (ref 30.0–36.0)
MCV: 66.3 fL — ABNORMAL LOW (ref 78.0–100.0)
Platelets: 317 10*3/uL (ref 150–400)
RBC: 4.72 MIL/uL (ref 4.22–5.81)
RDW: 18.6 % — ABNORMAL HIGH (ref 11.5–15.5)
WBC: 6.2 10*3/uL (ref 4.0–10.5)

## 2012-05-21 LAB — DIFFERENTIAL
Basophils Absolute: 0 10*3/uL (ref 0.0–0.1)
Basophils Relative: 1 % (ref 0–1)
Eosinophils Absolute: 0.2 10*3/uL (ref 0.0–0.7)
Eosinophils Relative: 3 % (ref 0–5)
Lymphocytes Relative: 40 % (ref 12–46)
Lymphs Abs: 2.5 10*3/uL (ref 0.7–4.0)
Monocytes Absolute: 0.4 10*3/uL (ref 0.1–1.0)
Monocytes Relative: 7 % (ref 3–12)
Neutro Abs: 3.1 10*3/uL (ref 1.7–7.7)
Neutrophils Relative %: 50 % (ref 43–77)

## 2012-05-21 LAB — HOMOCYSTEINE: Homocysteine: 11.8 umol/L (ref 4.0–15.4)

## 2012-05-21 LAB — RETICULOCYTES
RBC.: 4.72 MIL/uL (ref 4.22–5.81)
Retic Count, Absolute: 70.8 10*3/uL (ref 19.0–186.0)
Retic Ct Pct: 1.5 % (ref 0.4–3.1)

## 2012-05-21 LAB — IRON AND TIBC
Iron: 30 ug/dL — ABNORMAL LOW (ref 42–135)
Saturation Ratios: 7 % — ABNORMAL LOW (ref 20–55)
TIBC: 432 ug/dL (ref 215–435)
UIBC: 402 ug/dL — ABNORMAL HIGH (ref 125–400)

## 2012-05-21 LAB — PROTIME-INR
INR: 1.51 — ABNORMAL HIGH (ref 0.00–1.49)
Prothrombin Time: 17.8 seconds — ABNORMAL HIGH (ref 11.6–15.2)

## 2012-05-21 LAB — FERRITIN: Ferritin: 7 ng/mL — ABNORMAL LOW (ref 22–322)

## 2012-05-21 NOTE — Progress Notes (Signed)
Labs added testosterone free and total

## 2012-05-21 NOTE — Progress Notes (Signed)
Problem number 1 stage III cancer of the rectum status post preoperative radiation and chemotherapy followed by definitive surgery. He then underwent adjuvant chemotherapy with FOLFOX. During the radiation therapy and continuous infusion 5-FU from 09/09/2010 through 10/10/2010. FOLFOX was administered from October 2012 through 07/14/2011. Thus far he has no evidence for recurrent disease. Problem #2 DVT more so the right leg than the left. He has been on Coumadin for more than 12 months. His recent r ultrasound still show changes of thrombosis that appear chronic but we will repeat one in 8 weeks to verify this and then stop Coumadin if all is stable. Problem #3 stool leakage probably due to chronic radiation therapy irritation and fibrosis potentially that is improved with what Dr. Kendell Bane has done for him. Problem #4 impotence which you brought today for the first time and we will check his testosterone levels and consider testosterone replacement if need be plus or minus Viagra. Problem #5 degenerative arthritis of his hands due to his work This gentleman is doing very well the present time. I have reviewed his ultrasound of his leg with radiology and they feel that these truly chronic.  He has intermittent swelling of the right leg in particular sometimes of more the right especially after he has been on his feet during the day.Marland Kitchen He has no swelling in the mornings when he first wakes up. He brought up the impotence only as a last question. Prior to his diagnosis and radiation therapy etc. he had normal sexual function he states which he would like to get back if possible.  Vital signs are recorded. They are stable. He has no lymphadenopathy. He has minimal puffiness of the right ankle today. Lungs are clear. Heart shows a regular rhythm and rate without murmur rub or gallop. Abdomen remains soft and nontender scars are well-healed. He has no hepatosplenomegaly. Bowel sounds are normal.  We will  evaluate his lab work first. #2 we will check his testosterone level and get back with them. We will then repeat his Dopplers of his right lower extremity in 8 weeks and if all is stable and appear to be only chronic we will stop the Coumadin. We will still considered to do one more CAT scan of his abdomen pelvis and chest in 12 months.

## 2012-05-21 NOTE — Patient Instructions (Addendum)
[facility] Discharge Instructions  RECOMMENDATIONS MADE BY THE CONSULTANT AND ANY TEST RESULTS WILL BE SENT TO YOUR REFERRING PHYSICIAN.  We will add a testosterone level to your blood test from today. We will call you with any abnormal lab results. Start Coumadin 8 mg daily, we will recheck pt/inr on Tue 05/25/12. Dr.Neijstrom wants you to have another Ultrasound of your right lower leg in 2 months, we will schedule that. Return to see MD after Ultrasound of your right leg.  Thank you for choosing [name] to provide your oncology and hematology care.  To afford each patient quality time with our providers, please arrive at least 15 minutes before your scheduled appointment time.  With your help, our goal is to use those 15 minutes to complete the necessary work-up to ensure our physicians have the information they need to help with your evaluation and healthcare recommendations.    Effective January 1st, 2014, we ask that you re-schedule your appointment with our physicians should you arrive 10 or more minutes late for your appointment.  We strive to give you quality time with our providers, and arriving late affects you and other patients whose appointments are after yours.    Again, thank you for choosing [facility].  Our hope is that these requests will decrease the amount of time that you wait before being seen by our physicians.       _____________________________________________________________  Should you have questions after your visit to [facility], please contact our office at [phone] between the hours of 8:30 a.m. and 5:00 p.m.  Voicemails left after 4:30 p.m. will not be returned until the following business day.  For prescription refill requests, have your pharmacy contact our office with your prescription refill request.

## 2012-05-21 NOTE — Progress Notes (Signed)
Labs drawn today for hypercoag panel,pt,anemia panel

## 2012-05-24 ENCOUNTER — Other Ambulatory Visit (HOSPITAL_COMMUNITY): Payer: Self-pay | Admitting: *Deleted

## 2012-05-24 ENCOUNTER — Other Ambulatory Visit (HOSPITAL_COMMUNITY): Payer: Self-pay | Admitting: Oncology

## 2012-05-24 DIAGNOSIS — D509 Iron deficiency anemia, unspecified: Secondary | ICD-10-CM

## 2012-05-24 LAB — BETA-2-GLYCOPROTEIN I ABS, IGG/M/A
Beta-2 Glyco I IgG: 0 G Units (ref <20)
Beta-2-Glycoprotein I IgA: 6 A Units (ref <20)
Beta-2-Glycoprotein I IgM: 2 M Units (ref <20)

## 2012-05-24 LAB — LUPUS ANTICOAGULANT PANEL
DRVVT: 41.1 secs (ref <42.9)
Lupus Anticoagulant: NOT DETECTED
PTT Lupus Anticoagulant: 38.4 secs (ref 28.0–43.0)

## 2012-05-24 LAB — TESTOSTERONE: Testosterone: 280.02 ng/dL — ABNORMAL LOW (ref 300–890)

## 2012-05-24 LAB — FACTOR 5 LEIDEN

## 2012-05-24 LAB — TESTOSTERONE, FREE: Testosterone, Free: 51.9 pg/mL (ref 47.0–244.0)

## 2012-05-24 LAB — PROTEIN C ACTIVITY: Protein C Activity: 60 % — ABNORMAL LOW (ref 75–133)

## 2012-05-24 LAB — CARDIOLIPIN ANTIBODIES, IGG, IGM, IGA
Anticardiolipin IgA: 5 APL U/mL — ABNORMAL LOW (ref <22)
Anticardiolipin IgG: 6 GPL U/mL — ABNORMAL LOW (ref <23)
Anticardiolipin IgM: 0 MPL U/mL — ABNORMAL LOW (ref <11)

## 2012-05-24 LAB — PROTEIN S ACTIVITY: Protein S Activity: 51 % — ABNORMAL LOW (ref 69–129)

## 2012-05-24 LAB — SEX HORMONE BINDING GLOBULIN: Sex Hormone Binding: 36 nmol/L (ref 13–71)

## 2012-05-24 LAB — TESTOSTERONE, % FREE: Testosterone-% Free: 1.9 % — ABNORMAL HIGH (ref 1.6–2.9)

## 2012-05-25 ENCOUNTER — Encounter (HOSPITAL_BASED_OUTPATIENT_CLINIC_OR_DEPARTMENT_OTHER): Payer: BC Managed Care – PPO

## 2012-05-25 DIAGNOSIS — I82409 Acute embolism and thrombosis of unspecified deep veins of unspecified lower extremity: Secondary | ICD-10-CM

## 2012-05-25 LAB — PROTEIN S, TOTAL: Protein S Ag, Total: 55 % — ABNORMAL LOW (ref 60–150)

## 2012-05-25 LAB — OCCULT BLOOD X 1 CARD TO LAB, STOOL
Fecal Occult Bld: POSITIVE — AB
Fecal Occult Bld: POSITIVE — AB
Fecal Occult Bld: POSITIVE — AB

## 2012-05-25 LAB — PROTIME-INR
INR: 1.93 — ABNORMAL HIGH (ref 0.00–1.49)
Prothrombin Time: 21.3 seconds — ABNORMAL HIGH (ref 11.6–15.2)

## 2012-05-25 LAB — PROTEIN C, TOTAL: Protein C, Total: 69 % — ABNORMAL LOW (ref 72–160)

## 2012-05-25 LAB — PROTHROMBIN GENE MUTATION

## 2012-05-25 NOTE — Progress Notes (Addendum)
Labs drawn today for pt, 3 hemoccult card collected

## 2012-05-27 ENCOUNTER — Telehealth (HOSPITAL_COMMUNITY): Payer: Self-pay | Admitting: *Deleted

## 2012-05-27 NOTE — Telephone Encounter (Signed)
Pt's wife called to see if we have appt for [facility] yet with Dr. Jena Gauss. I told her that Dr. Jena Gauss out of office yesterday and Dr. Mariel Sleet will talk to him when he returns on Friday. Labs/note and referral faxed to Dr. Jeanella Flattery office.

## 2012-05-28 LAB — ANTITHROMBIN III: AntiThromb III Func: 96 % (ref 75–120)

## 2012-06-01 ENCOUNTER — Encounter (HOSPITAL_BASED_OUTPATIENT_CLINIC_OR_DEPARTMENT_OTHER): Payer: BC Managed Care – PPO

## 2012-06-01 DIAGNOSIS — I82409 Acute embolism and thrombosis of unspecified deep veins of unspecified lower extremity: Secondary | ICD-10-CM

## 2012-06-01 LAB — PROTIME-INR
INR: 2.19 — ABNORMAL HIGH (ref 0.00–1.49)
Prothrombin Time: 23.4 seconds — ABNORMAL HIGH (ref 11.6–15.2)

## 2012-06-01 NOTE — Progress Notes (Signed)
Labs drawn today for pt 

## 2012-06-10 ENCOUNTER — Encounter: Payer: Self-pay | Admitting: Internal Medicine

## 2012-06-10 ENCOUNTER — Encounter (HOSPITAL_COMMUNITY): Payer: BC Managed Care – PPO | Attending: Hematology and Oncology

## 2012-06-10 DIAGNOSIS — I82409 Acute embolism and thrombosis of unspecified deep veins of unspecified lower extremity: Secondary | ICD-10-CM | POA: Insufficient documentation

## 2012-06-10 DIAGNOSIS — C189 Malignant neoplasm of colon, unspecified: Secondary | ICD-10-CM | POA: Insufficient documentation

## 2012-06-10 LAB — PROTIME-INR
INR: 2.41 — ABNORMAL HIGH (ref 0.00–1.49)
Prothrombin Time: 25.1 seconds — ABNORMAL HIGH (ref 11.6–15.2)

## 2012-06-10 NOTE — Progress Notes (Signed)
INR results given to patient's wife. Return in 2 weeks for INR 2/20 @ 0840. Coumadin 8mg  daily. Wife verbalized understanding.

## 2012-06-10 NOTE — Progress Notes (Signed)
Labs drawn today for pt 

## 2012-06-15 ENCOUNTER — Ambulatory Visit (INDEPENDENT_AMBULATORY_CARE_PROVIDER_SITE_OTHER): Payer: BC Managed Care – PPO | Admitting: Gastroenterology

## 2012-06-15 ENCOUNTER — Encounter: Payer: Self-pay | Admitting: Gastroenterology

## 2012-06-15 VITALS — BP 141/78 | HR 74 | Temp 97.0°F | Ht 72.0 in | Wt 222.6 lb

## 2012-06-15 DIAGNOSIS — D649 Anemia, unspecified: Secondary | ICD-10-CM

## 2012-06-15 DIAGNOSIS — R195 Other fecal abnormalities: Secondary | ICD-10-CM

## 2012-06-15 DIAGNOSIS — C189 Malignant neoplasm of colon, unspecified: Secondary | ICD-10-CM

## 2012-06-15 NOTE — Progress Notes (Signed)
Referring Provider: [name]., MD Primary Care Physician:  [name]., MD Primary Gastroenterologist: Dr. Jena Gauss   Chief Complaint  Patient presents with  . Rectal Bleeding    HPI:   [name] is a pleasant 62 year old male who presents today at the request of Dr. Mariel Sleet secondary to heme positive stool. His history is significant for stage III rectal cancer s/p pre-operative radiation and chemo followed by surgery. His first surveillance colonoscopy since surgery was Dec 2013 by Dr. Jena Gauss. This showed a friable, fribotic appearing anastomosis. No evidence of cancer. He has a chronic DVT.  Most recent CBC notable for Hgb 9.3, which is slightly lower than his baseline last year (in the 10-11 range). Iron low at 30, ferritin significantly low at 7. His last EGD was in March 2012 with Schatzki's ring s/p dilation, small hiatal hernia, benign small bowel biopsy.   Denies any bright red blood per rectum, denies black/tarry stool prior to starting iron. However, now he states that his stool is black, which started after starting iron. Notes right before doing the hemoccults, he ate a large amount of pecan ice cream and popcorn. He then had a bowel movement, which felt like "briars" coming out of his rectum. Denies any abdominal pain. BM daily. No diarrhea. Notes he gets tired easily since having chemo/radiation. Good appetite. No N/V. Experiences reflux if he doesn't take Prilosec daily. No dysphagia.   On Coumadin, hx of DVT/PE in the past. Plans to hopefully come off Coumadin next month, followed by Dr. Mariel Sleet. Further blood work and xray pending next month.    Past Medical History  Diagnosis Date  . HTN (hypertension)   . Pulmonary embolism   . GERD (gastroesophageal reflux disease)   . Depression 04/01/2011  . DVT (deep venous thrombosis) 05/09/2011  . 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 Colon cancer 07/24/10    rectal ca, inv adenocarcinoma  . Hx of radiation therapy 09/02/10  to 10/14/10    pelvis    Past Surgical History  Procedure Laterality Date  . Esophagogastroduodenoscopy  07/2010    RMR: schatki ring s/p dilation, small hh, SB bx benign  . Colonoscopy  07/2010    proximal rectal apple core mass 10-14cm from anal verge (adenocarcinoma), 2-3cm distal rectal carpet polyp s/p piecemeal snare polypectomy (adenoma)  . Eus  08/2010    Dr. Rob Bunting. uT3N0 circumferential, nearly obstruction rectosigmoid adenocarcinoma, distal edge 12cm from anal verge  . Ivc filter    . Colon surgery  11/14/2010    proctectomy with colorectal anastomosis and diverting loop ileostomy (temporary planned)  . Port a cath placement    . Colostomy reversal  april 2013  . Port-a-cath removal  09/24/2011    Procedure: REMOVAL PORT-A-CATH;  Surgeon: Fabio Bering, MD;  Location: [facility];  Service: General;  Laterality: N/A;  Minor Room  . Ivc filter    . Colonoscopy  04/21/2012    RMR: Friable,fibrotic appearing colorectal anastomosis producing some luminal narrowing-not felt to be critical. path: focal erosion with slight inflammation and hyperemia. SURVEILLANCE DUE DEC 2015    Current Outpatient Prescriptions  Medication Sig Dispense Refill  . ALPRAZolam (XANAX) 0.5 MG tablet Take 0.5 mg by mouth 3 (three) times daily as needed.      . ferrous sulfate 325 (65 FE) MG EC tablet Take 325 mg by mouth 3 (three) times daily with meals.      . gabapentin (NEURONTIN) 300 MG capsule Take 900 mg by mouth  2 (two) times daily. 3 caps BID (900 mg)      . omeprazole (PRILOSEC OTC) 20 MG tablet Take 20 mg by mouth daily.      Marland Kitchen warfarin (COUMADIN) 4 MG tablet TAKE TWO TABLETS BY MOUTH EVERY DAY FOR A TOTAL OF 8MG   60 tablet  1  . clotrimazole-betamethasone (LOTRISONE) cream Apply topically 2 (two) times daily. Twice daily x 10 days.  Apply Desodan on top of this BID  30 g  0  . Lidocaine-Hydrocortisone Ace 3-0.5 % KIT Apply anorectally twice a day for 15 days.  30 each  0   No current  facility-administered medications for this visit.   Facility-Administered Medications Ordered in Other Visits  Medication Dose Route Frequency Provider Last Rate Last Dose  . sodium chloride 0.9 % injection 10 mL  10 mL Intravenous PRN Randall An, MD   10 mL at 08/22/11 1114    Allergies as of 06/15/2012  . (No Known Allergies)    Family History  Problem Relation Age of Onset  . Colon cancer Neg Hx   . Liver disease Neg Hx   . Inflammatory bowel disease Neg Hx   . Cancer Brother     throat  . Cancer Brother     prostate    History   Social History  . Marital Status: Married    Spouse Name: N/A    Number of Children: 2  . Years of Education: N/A   Occupational History  . self-employed Merchandiser, retail, currently not working    Social History Main Topics  . Smoking status: Never Smoker   . Smokeless tobacco: Not on file  . Alcohol Use: No  . Drug Use: No  . Sexually Active: Not on file   Other Topics Concern  . Not on file   Social History Narrative  . No narrative on file    Review of Systems: Gen: SEE HPI CV: Denies chest pain, palpitations, syncope, peripheral edema, and claudication. Resp: Denies dyspnea at rest, cough, wheezing, coughing up blood, and pleurisy. GI: SEE HPI Derm: Denies rash, itching, dry skin Psych: Denies depression, anxiety, memory loss, confusion. No homicidal or suicidal ideation.  Heme: Denies bruising, bleeding, and enlarged lymph nodes.  Physical Exam: BP 141/78  Pulse 74  Temp(Src) 97 F (36.1 C) (Oral)  Ht 6' (1.829 m)  Wt 222 lb 9.6 oz (100.971 kg)  BMI 30.18 kg/m2 General:   Alert and oriented. No distress noted. Pleasant and cooperative.  Head:  Normocephalic and atraumatic. Eyes:  Conjuctiva clear without scleral icterus. Mouth:  Oral mucosa pink and moist. Good dentition. No lesions. Neck:  Supple, without mass or thyromegaly. Heart:  S1, S2 present without murmurs, rubs, or gallops. Regular rate  and rhythm. Abdomen:  +BS, soft, non-tender and non-distended. No rebound or guarding. No HSM or masses noted. Msk:  Symmetrical without gross deformities. Normal posture. Extremities:  Without edema. Neurologic:  Alert and  oriented x4;  grossly normal neurologically. Skin:  Intact without significant lesions or rashes. Cervical Nodes:  No significant cervical adenopathy. Psych:  Alert and cooperative. Normal mood and affect.

## 2012-06-15 NOTE — Patient Instructions (Addendum)
I would like to see you back in March 2014.  If you have any evidence of bright red blood per rectum, dark/tarry stools, abdominal pain, nausea, vomiting, please call us immediately or seek medical help right away.  I will be discussing with Dr. Jena Gauss the next best step.

## 2012-06-16 ENCOUNTER — Encounter: Payer: Self-pay | Admitting: Gastroenterology

## 2012-06-16 DIAGNOSIS — R195 Other fecal abnormalities: Secondary | ICD-10-CM | POA: Insufficient documentation

## 2012-06-16 DIAGNOSIS — D649 Anemia, unspecified: Secondary | ICD-10-CM | POA: Insufficient documentation

## 2012-06-16 NOTE — Assessment & Plan Note (Signed)
62 year old male with history of colorectal cancer, stage III, status post pre-operative radiation and chemo followed by surgery.  His first surveillance colonoscopy since surgery was Dec 2013 by Dr. Jena Gauss. This showed a friable, fribotic appearing anastomosis. No evidence of cancer. He is due for surveillance again in Dec 2015. Heme positive stool noted through Dr. Thornton Papas office, as well as Hgb slightly normal than his baseline (now 9.3, previously 10-11 range). Iron was also low at 30, ferritin significantly low at 7. Last upper GI evaluation was in March 2012 with Schatzki's ring s/p dilation, small hiatal hernia, benign small bowel biopsy.  Pt is on Coumadin due to DVT/PE, and plans are to hopefully come off of this in the near future. Due to the presence of Coumadin, he could have blood loss from anywhere in the GI tract, but it is assuring that his surveillance colonoscopy is up-to-date. It would be prudent to re-evaluate his upper GI tract, consider capsule if necessary due to recent drop in Hgb, low iron, heme positive stools.  The patient DOES NOT want to pursue an invasive procedure at this time, and his wife is present with him. There are no signs of melena or other concerning issues. I discussed the risks and benefits in detail of proceeding with an upper endoscopy, and I also informed him of the need for further evaluation due to his history. He and his wife both continue to request waiting until March, when it is a possibility of stopping Coumadin. They are concerned about the lovenox injections, and they want to hold off on procedures right now.   Return in March 2014. Pt informed of signs/symptoms that would necessitate sooner evaluation.

## 2012-06-16 NOTE — Assessment & Plan Note (Signed)
Likely multifactorial. Heme positive. Needs further evaluation. Evidence of IDA.

## 2012-06-16 NOTE — Assessment & Plan Note (Signed)
Surveillance due Dec 2015.

## 2012-06-16 NOTE — Progress Notes (Signed)
Faxed to PCP and Dr Mariel Sleet

## 2012-06-23 ENCOUNTER — Telehealth (HOSPITAL_COMMUNITY): Payer: Self-pay | Admitting: *Deleted

## 2012-06-23 ENCOUNTER — Encounter (HOSPITAL_BASED_OUTPATIENT_CLINIC_OR_DEPARTMENT_OTHER): Payer: BC Managed Care – PPO

## 2012-06-23 DIAGNOSIS — C189 Malignant neoplasm of colon, unspecified: Secondary | ICD-10-CM

## 2012-06-23 DIAGNOSIS — I82409 Acute embolism and thrombosis of unspecified deep veins of unspecified lower extremity: Secondary | ICD-10-CM

## 2012-06-23 LAB — PROTIME-INR
INR: 2.88 — ABNORMAL HIGH (ref 0.00–1.49)
Prothrombin Time: 28.7 seconds — ABNORMAL HIGH (ref 11.6–15.2)

## 2012-06-23 LAB — PSA: PSA: 0.45 ng/mL (ref <=4.00)

## 2012-06-23 NOTE — Progress Notes (Signed)
Patient notified that coumadin needs to change to 8/7. Recheck in 3 weeks. Notified that endo procedures definitely need to be done.

## 2012-06-23 NOTE — Progress Notes (Signed)
Labs drawn today for psa and pt

## 2012-06-23 NOTE — Telephone Encounter (Signed)
[facility] asked if it would be ok to wait on GI procedures (to check out reason for positive stool cards) until after he comes off coumadin at the end of March. Steward Drone also asked if we could repeat stool cards at that time to see if they are still positive before continuing with the procedures? He sees [place] Elijah Birk) on 3/24.

## 2012-06-24 ENCOUNTER — Telehealth: Payer: Self-pay | Admitting: *Deleted

## 2012-06-24 ENCOUNTER — Other Ambulatory Visit (HOSPITAL_COMMUNITY): Payer: Self-pay

## 2012-06-24 DIAGNOSIS — D649 Anemia, unspecified: Secondary | ICD-10-CM

## 2012-06-24 MED ORDER — HYDROCORTISONE ACETATE 25 MG RE SUPP: 25.0000 mg | Freq: Two times a day (BID) | RECTAL | Status: DC

## 2012-06-24 NOTE — Telephone Encounter (Signed)
Stat CBC. Anusol suppositories BID called in.  Avoid constipation, straining.

## 2012-06-24 NOTE — Telephone Encounter (Signed)
pts wife is aware, lab order faxed to lab.

## 2012-06-24 NOTE — Telephone Encounter (Signed)
Ms Leeth called today for her husband. He has increased blood in his stool and was feeling weak on Tuesday. He had blood when brushing his teeth this am. His coumadin was changed, however she is still concerned.  He will be unavailable today, but she was hoping for something tomorrow or next week. Please call her back

## 2012-06-24 NOTE — Addendum Note (Signed)
Addended by: [name] on: 06/24/2012 11:59 AM   Modules accepted: Orders

## 2012-06-24 NOTE — Telephone Encounter (Signed)
Spoke with pts wife- he helped their son move a couch yesterday and then he had bright pink blood around his stool and in the middle of it yesterday. She stated on Tuesday he c/o feeling dizzy and yesterday he c/o feeling tired. He has c/o burning with bm's for two days. They decreased his coumadin from 8mg  to 7mg . pts wife is very concerned and wants pt to be seen in the office. Advised her that since he was just seen on 06/15/12 by [facility] I would talk with her first and see if he needs to be seen in the office or if we can just order bloodwork etc without him being seen.  Please advise.

## 2012-06-25 LAB — CBC WITH DIFFERENTIAL/PLATELET
Basophils Absolute: 0 10*3/uL (ref 0.0–0.1)
Basophils Relative: 0 % (ref 0–1)
Eosinophils Absolute: 0.1 10*3/uL (ref 0.0–0.7)
Eosinophils Relative: 2 % (ref 0–5)
HCT: 34.8 % — ABNORMAL LOW (ref 39.0–52.0)
Hemoglobin: 11 g/dL — ABNORMAL LOW (ref 13.0–17.0)
Lymphocytes Relative: 31 % (ref 12–46)
Lymphs Abs: 2.7 10*3/uL (ref 0.7–4.0)
MCH: 22.1 pg — ABNORMAL LOW (ref 26.0–34.0)
MCHC: 31.6 g/dL (ref 30.0–36.0)
MCV: 70 fL — ABNORMAL LOW (ref 78.0–100.0)
Monocytes Absolute: 0.6 10*3/uL (ref 0.1–1.0)
Monocytes Relative: 7 % (ref 3–12)
Neutro Abs: 5.3 10*3/uL (ref 1.7–7.7)
Neutrophils Relative %: 60 % (ref 43–77)
Platelets: 317 10*3/uL (ref 150–400)
RBC: 4.97 MIL/uL (ref 4.22–5.81)
RDW: 27.3 % — ABNORMAL HIGH (ref 11.5–15.5)
WBC: 8.8 10*3/uL (ref 4.0–10.5)

## 2012-06-25 MED ORDER — MESALAMINE 1000 MG RE SUPP: 1000.0000 mg | Freq: Every day | RECTAL | Status: DC

## 2012-06-25 NOTE — Telephone Encounter (Signed)
pts wife is aware and rx has been sent to the pharmacy.

## 2012-06-25 NOTE — Telephone Encounter (Signed)
Hemoglobin up about 2 g one recently recheck. Patient bleeding from friable anastomosis in a setting of Coumadin in all likelihood. This will probably improve / resolve once he is able to come off anticoagulation. Until then, let's try a one-month course of Canasa suppositories 1 g per rectum at bedtime. We'll plan to see the patient back in the office in about 2 weeks from now to

## 2012-06-25 NOTE — Addendum Note (Signed)
Addended by: Myra Rude on: 06/25/2012 11:46 AM   Modules accepted: Orders

## 2012-06-25 NOTE — Telephone Encounter (Signed)
Pt has appt for 07/13/12 with [facility]

## 2012-06-25 NOTE — Telephone Encounter (Addendum)
Dr. Jena Gauss, this pts blood work was faxed to [place] today but it is not in epic and AS is not here today. Will you look at this please? It is on your cart.

## 2012-06-28 NOTE — Telephone Encounter (Signed)
Noted  

## 2012-07-07 ENCOUNTER — Other Ambulatory Visit (HOSPITAL_COMMUNITY): Payer: Self-pay | Admitting: Oncology

## 2012-07-12 ENCOUNTER — Encounter (HOSPITAL_COMMUNITY): Payer: BC Managed Care – PPO | Attending: Hematology and Oncology

## 2012-07-12 ENCOUNTER — Ambulatory Visit (HOSPITAL_COMMUNITY)
Admission: RE | Admit: 2012-07-12 | Discharge: 2012-07-12 | Disposition: A | Discharge status: Home / Self Care | Payer: BC Managed Care – PPO | Source: Ambulatory Visit | Attending: Oncology | Admitting: Oncology

## 2012-07-12 DIAGNOSIS — C189 Malignant neoplasm of colon, unspecified: Secondary | ICD-10-CM | POA: Insufficient documentation

## 2012-07-12 DIAGNOSIS — R195 Other fecal abnormalities: Secondary | ICD-10-CM | POA: Insufficient documentation

## 2012-07-12 DIAGNOSIS — I82409 Acute embolism and thrombosis of unspecified deep veins of unspecified lower extremity: Secondary | ICD-10-CM | POA: Insufficient documentation

## 2012-07-12 DIAGNOSIS — D509 Iron deficiency anemia, unspecified: Secondary | ICD-10-CM | POA: Insufficient documentation

## 2012-07-12 LAB — PROTIME-INR
INR: 2.14 — ABNORMAL HIGH (ref 0.00–1.49)
Prothrombin Time: 23 seconds — ABNORMAL HIGH (ref 11.6–15.2)

## 2012-07-12 NOTE — Addendum Note (Signed)
Addended by: Dennie Maizes on: 07/12/2012 03:00 PM   Modules accepted: Orders

## 2012-07-12 NOTE — Progress Notes (Signed)
Labs drawn today for pt 

## 2012-07-13 ENCOUNTER — Ambulatory Visit (INDEPENDENT_AMBULATORY_CARE_PROVIDER_SITE_OTHER): Payer: BC Managed Care – PPO | Admitting: Gastroenterology

## 2012-07-13 ENCOUNTER — Encounter: Payer: Self-pay | Admitting: Gastroenterology

## 2012-07-13 VITALS — BP 127/77 | HR 82 | Temp 98.1°F | Ht 74.0 in | Wt 226.0 lb

## 2012-07-13 DIAGNOSIS — R195 Other fecal abnormalities: Secondary | ICD-10-CM

## 2012-07-13 DIAGNOSIS — D649 Anemia, unspecified: Secondary | ICD-10-CM

## 2012-07-13 MED ORDER — LIDOCAINE-HYDROCORTISONE ACE 3-0.5 % RE KIT: PACK | RECTAL | Status: DC

## 2012-07-13 NOTE — Patient Instructions (Addendum)
I have refilled the medication and sent to [facility].  Please call us when you are off Coumadin.  Dr. Jena Gauss will see you again in 6 months.

## 2012-07-13 NOTE — Progress Notes (Signed)
Faxed to PCP

## 2012-07-13 NOTE — Assessment & Plan Note (Signed)
History of colorectal cancer stage III, s/p pre-operative radiation and chemo followed by surgery. He has undergone surveillance colonoscopy as of Dec 2013 with friable, fribotic anastomosis. Surveillance due Dec 2015. Heme positive likely secondary to presence of Coumadin. One incidence of brbpr resolved with proctozone. Notes rectal discomfort with red sauce. I recommended EGD with Dr. Jena Gauss with possible capsule if indicated, specifically due to evidence of IDA. He continues to request a wait and see approach. He will be seeing Hem/Onc in several weeks to discuss Coumadin status. Hemoccult stools in near future. If remains positive off Coumadin, he would be willing to further evaluate. Will await oncology input. Return in 6 months to see Dr. Jena Gauss.

## 2012-07-13 NOTE — Assessment & Plan Note (Addendum)
Evidence of IDA, heme positive.  Discussed importance of further evaluation. However, he would like to wait until further hemoccults are obtained through oncology.

## 2012-07-13 NOTE — Progress Notes (Signed)
Referring Provider: [name]., MD Primary Care Physician:  [name]., MD Primary GI: Dr. Jena Gauss   Chief Complaint  Patient presents with  . follow up    HPI:   Mr. Starliper returns today in follow-up to discuss further evaluation secondary to anemia, IDA, on Coumadin secondary to DVT/PE. I saw him at Dr. Thornton Papas request in Feb 2014; however, patient wanted to hold off on scheduling procedures at that time. He wants to be off Coumadin before proceeding with procedures.  He has a history of colorectal cancer, stage III, status post pre-operative radiation and chemo followed by surgery. His first surveillance colonoscopy since surgery was Dec 2013 by Dr. Jena Gauss. This showed a friable, fribotic appearing anastomosis. No evidence of cancer. He is due for surveillance again in Dec 2015. Iron recently noted to be low at 30, ferritin significantly low at 7. Last upper GI evaluation was in March 2012 with Schatzki's ring s/p dilation, small hiatal hernia, benign small bowel biopsy. In the interim from the last visit, he called in with rectal bleeding. CBC updated at that time, with actually improved Hgb to 11 from prior Hgb in January (9.3). He was prescribed Canasa suppositories at that time. He was unable to fill this due to cost of 200$. He was given Proctozone in the past from our office. States he has rectal discomfort with red sauce or peppers. No further rectal bleeding. Notes black, tarry stool, but he is on iron. Hopefully coming off Coumadin in near future. No abdominal pain. Appetite good, up 5 lbs. No dysphagia. Has problems with Potassium pills. HE DOES NOT WANT TO PURSUE ENDOSCOPIC INTERVENTION RIGHT NOW. He would like to wait until hemoccults obtained again OFF Coumadin.    Past Medical History  Diagnosis Date  . HTN (hypertension)   . Pulmonary embolism   . GERD (gastroesophageal reflux disease)   . Depression 04/01/2011  . DVT (deep venous thrombosis) 05/09/2011  . High output  ileostomy 05/21/2011  . Colon cancer 07/24/10    rectal ca, inv adenocarcinoma  . Hx of radiation therapy 09/02/10 to 10/14/10    pelvis    Past Surgical History  Procedure Laterality Date  . Esophagogastroduodenoscopy  07/2010    RMR: schatki ring s/p dilation, small hh, SB bx benign  . Colonoscopy  07/2010    proximal rectal apple core mass 10-14cm from anal verge (adenocarcinoma), 2-3cm distal rectal carpet polyp s/p piecemeal snare polypectomy (adenoma)  . Eus  08/2010    Dr. Rob Bunting. uT3N0 circumferential, nearly obstruction rectosigmoid adenocarcinoma, distal edge 12cm from anal verge  . Ivc filter    . Colon surgery  11/14/2010    proctectomy with colorectal anastomosis and diverting loop ileostomy (temporary planned)  . Port a cath placement    . Colostomy reversal  april 2013  . Port-a-cath removal  09/24/2011    Procedure: REMOVAL PORT-A-CATH;  Surgeon: Fabio Bering, MD;  Location: [facility];  Service: General;  Laterality: N/A;  Minor Room  . Ivc filter    . Colonoscopy  04/21/2012    RMR: Friable,fibrotic appearing colorectal anastomosis producing some luminal narrowing-not felt to be critical. path: focal erosion with slight inflammation and hyperemia. SURVEILLANCE DUE DEC 2015    Current Outpatient Prescriptions  Medication Sig Dispense Refill  . ALPRAZolam (XANAX) 0.5 MG tablet Take 0.5 mg by mouth 3 (three) times daily as needed.      . ferrous sulfate 325 (65 FE) MG EC tablet Take 325 mg by  mouth 1 day or 1 dose.       . gabapentin (NEURONTIN) 300 MG capsule TAKE THREE CAPSULES BY MOUTH IN THE MORNING AND THREE AT BEDTIME  540 capsule  2  . Lidocaine-Hydrocortisone Ace 3-0.5 % KIT Apply anorectally twice a day for 15 days.  30 each  0  . omeprazole (PRILOSEC OTC) 20 MG tablet Take 20 mg by mouth daily.      Marland Kitchen warfarin (COUMADIN) 4 MG tablet TAKE TWO TABLETS BY MOUTH EVERY DAY FOR A TOTAL OF 8MG   60 tablet  1  . clotrimazole-betamethasone (LOTRISONE) cream Apply  topically 2 (two) times daily. Twice daily x 10 days.  Apply Desodan on top of this BID  30 g  0  . hydrocortisone (ANUSOL-HC) 25 MG suppository Place 1 suppository (25 mg total) rectally 2 (two) times daily.  14 suppository  0  . mesalamine (CANASA) 1000 MG suppository Place 1 suppository (1,000 mg total) rectally at bedtime.  30 suppository  0   No current facility-administered medications for this visit.   Facility-Administered Medications Ordered in Other Visits  Medication Dose Route Frequency Provider Last Rate Last Dose  . sodium chloride 0.9 % injection 10 mL  10 mL Intravenous PRN Randall An, MD   10 mL at 08/22/11 1114    Allergies as of 07/13/2012  . (No Known Allergies)    Family History  Problem Relation Age of Onset  . Colon cancer Neg Hx   . Liver disease Neg Hx   . Inflammatory bowel disease Neg Hx   . Cancer Brother     throat  . Cancer Brother     prostate    History   Social History  . Marital Status: Married    Spouse Name: N/A    Number of Children: 2  . Years of Education: N/A   Occupational History  . self-employed Merchandiser, retail, currently not working    Social History Main Topics  . Smoking status: Never Smoker   . Smokeless tobacco: None  . Alcohol Use: No  . Drug Use: No  . Sexually Active: None   Other Topics Concern  . None   Social History Narrative  . None    Review of Systems: Negative unless mentioned in HPI.   Physical Exam: BP 127/77  Pulse 82  Temp(Src) 98.1 F (36.7 C) (Oral)  Ht 6\' 2"  (1.88 m)  Wt 226 lb (102.513 kg)  BMI 29 kg/m2 General:   Alert and oriented. No distress noted. Pleasant and cooperative.  Head:  Normocephalic and atraumatic. Eyes:  Conjuctiva clear without scleral icterus. Mouth:  Oral mucosa pink and moist.  Heart:  S1, S2 present without murmurs, rubs, or gallops. Regular rate and rhythm. Abdomen:  +BS, soft, non-tender and non-distended. No rebound or guarding. No HSM or  masses noted. Msk:  Symmetrical without gross deformities. Normal posture. Extremities:  Without edema. Neurologic:  Alert and  oriented x4;  grossly normal neurologically. Skin:  Intact without significant lesions or rashes. Psych:  Alert and cooperative. Normal mood and affect.

## 2012-07-19 ENCOUNTER — Other Ambulatory Visit (HOSPITAL_COMMUNITY): Payer: Self-pay

## 2012-07-19 DIAGNOSIS — I82401 Acute embolism and thrombosis of unspecified deep veins of right lower extremity: Secondary | ICD-10-CM

## 2012-07-19 MED ORDER — WARFARIN SODIUM 4 MG PO TABS: ORAL_TABLET | ORAL | Status: DC

## 2012-07-20 ENCOUNTER — Other Ambulatory Visit (HOSPITAL_COMMUNITY): Payer: Self-pay | Admitting: Oncology

## 2012-07-20 DIAGNOSIS — I82401 Acute embolism and thrombosis of unspecified deep veins of right lower extremity: Secondary | ICD-10-CM

## 2012-07-20 MED ORDER — WARFARIN SODIUM 4 MG PO TABS: ORAL_TABLET | ORAL | Status: DC

## 2012-07-22 ENCOUNTER — Encounter (HOSPITAL_BASED_OUTPATIENT_CLINIC_OR_DEPARTMENT_OTHER): Payer: BC Managed Care – PPO

## 2012-07-22 DIAGNOSIS — I82409 Acute embolism and thrombosis of unspecified deep veins of unspecified lower extremity: Secondary | ICD-10-CM

## 2012-07-22 DIAGNOSIS — D509 Iron deficiency anemia, unspecified: Secondary | ICD-10-CM

## 2012-07-22 DIAGNOSIS — C189 Malignant neoplasm of colon, unspecified: Secondary | ICD-10-CM

## 2012-07-22 LAB — FERRITIN: Ferritin: 24 ng/mL (ref 22–322)

## 2012-07-22 LAB — CBC
HCT: 42.5 % (ref 39.0–52.0)
Hemoglobin: 13.3 g/dL (ref 13.0–17.0)
MCH: 24.4 pg — ABNORMAL LOW (ref 26.0–34.0)
MCHC: 31.3 g/dL (ref 30.0–36.0)
MCV: 77.8 fL — ABNORMAL LOW (ref 78.0–100.0)
Platelets: 271 10*3/uL (ref 150–400)
RBC: 5.46 MIL/uL (ref 4.22–5.81)
RDW: 23.6 % — ABNORMAL HIGH (ref 11.5–15.5)
WBC: 5.2 10*3/uL (ref 4.0–10.5)

## 2012-07-22 LAB — IRON AND TIBC
Iron: 112 ug/dL (ref 42–135)
Saturation Ratios: 30 % (ref 20–55)
TIBC: 372 ug/dL (ref 215–435)
UIBC: 260 ug/dL (ref 125–400)

## 2012-07-22 LAB — CEA: CEA: 1.1 ng/mL (ref 0.0–5.0)

## 2012-07-22 LAB — PROTIME-INR
INR: 2.48 — ABNORMAL HIGH (ref 0.00–1.49)
Prothrombin Time: 25.7 seconds — ABNORMAL HIGH (ref 11.6–15.2)

## 2012-07-22 NOTE — Progress Notes (Signed)
Labs drawn today for cbc,Iron and IBC,ferr,pt,cea,

## 2012-07-25 NOTE — Progress Notes (Signed)
[name]., MD [address] [place] [address]  Colon cancer - Plan: CBC with Differential, Comprehensive metabolic panel, CT Abdomen Pelvis W Contrast  DVT (deep venous thrombosis), right  Iron deficiency - Plan: Iron and TIBC, Ferritin  Heme positive stool - Plan: Occult blood card to lab, stool, Occult blood card to lab, stool, Occult blood card to lab, stool  CURRENT THERAPY: Observation and anticoagulation with Coumadin  INTERVAL HISTORY: REGNALD BOWENS 62 y.o. male returns for  regular  visit for followup of  Stage III cancer of the rectum status post preoperative radiation and chemotherapy followed by definitive surgery. He then underwent adjuvant chemotherapy with FOLFOX. During the radiation therapy and continuous infusion 5-FU from 09/09/2010 through 10/10/2010. FOLFOX was administered from October 2012 through 07/14/2011. Thus far he has no evidence for recurrent disease.  AND  DVT of right leg. He has been on Coumadin for more than 12 months. His right LE ultrasound in Jan 2014 still showed changes of thrombosis that appear chronic, this was then repeated 8 weeks later on 07/12/2012 to verify this and this too was stable.  As a result, will D/C Coumadin.    I personally reviewed and went over laboratory results with the patient.  Mr. Arvelo labs have improved with regards to his iron studies since being on PO iron.  His MCV has increased, but still low, RDW has decreased but still high, Hgb has increased nearly 2 grams, TIBC has decreased, serum iron now WNL, and Ferritin up to 24 from 7.   He was encouraged to continue with his PO iron.  I personally reviewed and went over radiographic studies with the patient. On 07/12/12 he underwent another US doppler of right LE which illustrated stability of chronic DVT.  As a result, we will D/C Coumadin after greater than 12 month of anticoagulation.   He is pleased with this.   We spent some time reviewing the NCCN guidelines  recommending CEAs every 3 months x 2 years (March 2015) and then every 6 months x 3 years.  Will also perform annual CT surveillance. X 5 years (2018). He is agreeable to this.   They have also requested that we test his stool in 3 months since we are stopping the Coumadin.  There is suspicion that his postiive heme test of stool is positive due to Coumadin.  As a result, we will retest in 3 months.  If positive, will refer the patient back to Dr. Kendell Bane for evaluation.   Oncologically, he denies any complaints and ROS questioning is negative.   Past Medical History  Diagnosis Date  . HTN (hypertension)   . Pulmonary embolism   . GERD (gastroesophageal reflux disease)   . Depression 04/01/2011  . DVT (deep venous thrombosis) 05/09/2011  . 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 Colon cancer 07/24/10    rectal ca, inv adenocarcinoma  . Hx of radiation therapy 09/02/10 to 10/14/10    pelvis    has GERD; DIARRHEA; Colon cancer; Weight loss, abnormal; Dehydration; Depression; DVT (deep venous thrombosis); High output ileostomy; Hx of radiation therapy; Heme positive stool; and Anemia on his problem list.     has No Known Allergies.  Mr. Gales had no medications administered during this visit.  Past Surgical History  Procedure Laterality Date  . Esophagogastroduodenoscopy  07/2010    RMR: schatki ring s/p dilation, small hh, SB bx benign  . Colonoscopy  07/2010    proximal rectal apple core mass 10-14cm from  anal verge (adenocarcinoma), 2-3cm distal rectal carpet polyp s/p piecemeal snare polypectomy (adenoma)  . Eus  08/2010    Dr. Rob Bunting. uT3N0 circumferential, nearly obstruction rectosigmoid adenocarcinoma, distal edge 12cm from anal verge  . Ivc filter    . Colon surgery  11/14/2010    proctectomy with colorectal anastomosis and diverting loop ileostomy (temporary planned)  . Port a cath placement    . Colostomy reversal  april 2013  . Port-a-cath removal  09/24/2011    Procedure:  REMOVAL PORT-A-CATH;  Surgeon: Fabio Bering, MD;  Location: [facility];  Service: General;  Laterality: N/A;  Minor Room  . Ivc filter    . Colonoscopy  04/21/2012    RMR: Friable,fibrotic appearing colorectal anastomosis producing some luminal narrowing-not felt to be critical. path: focal erosion with slight inflammation and hyperemia. SURVEILLANCE DUE DEC 2015    Denies any headaches, dizziness, double vision, fevers, chills, night sweats, nausea, vomiting, diarrhea, constipation, chest pain, heart palpitations, shortness of breath, blood in stool, black tarry stool, urinary pain, urinary burning, urinary frequency, hematuria.   PHYSICAL EXAMINATION  ECOG PERFORMANCE STATUS: 1 - Symptomatic but completely ambulatory  Filed Vitals:   07/26/12 1022  BP: 130/76  Pulse: 90  Temp: 97.9 F (36.6 C)  Resp: 16    GENERAL:alert, no distress, well nourished, well developed, comfortable, cooperative and smiling SKIN: skin color, texture, turgor are normal, no rashes or significant lesions HEAD: Normocephalic, No masses, lesions, tenderness or abnormalities EYES: normal, Conjunctiva are pink and non-injected EARS: External ears normal OROPHARYNX:mucous membranes are moist  NECK: supple, no adenopathy, thyroid normal size, non-tender, without nodularity, no stridor, non-tender, trachea midline LYMPH:  no palpable lymphadenopathy, no hepatosplenomegaly BREAST:not examined LUNGS: clear to auscultation and percussion HEART: regular rate & rhythm, no murmurs, no gallops, S1 normal and S2 normal ABDOMEN:abdomen soft, non-tender, obese, normal bowel sounds, no masses or organomegaly and no hepatosplenomegaly BACK: Back symmetric, no curvature., No CVA tenderness EXTREMITIES:less then 2 second capillary refill, no joint deformities, effusion, or inflammation, no edema, no skin discoloration, no clubbing, no cyanosis  NEURO: alert & oriented x 3 with fluent speech, no focal motor/sensory deficits,  gait normal    LABORATORY DATA: CBC    Component Value Date/Time   WBC 5.2 07/22/2012 0909   WBC 6.5 02/04/2011 1059   RBC 5.46 07/22/2012 0909   RBC 4.57 02/04/2011 1059   HGB 13.3 07/22/2012 0909   HGB 11.6* 02/04/2011 1059   HCT 42.5 07/22/2012 0909   HCT 34.7* 02/04/2011 1059   PLT 271 07/22/2012 0909   PLT 282 02/04/2011 1059   MCV 77.8* 07/22/2012 0909   MCV 76.0* 02/04/2011 1059   MCH 24.4* 07/22/2012 0909   MCH 25.4* 02/04/2011 1059   MCHC 31.3 07/22/2012 0909   MCHC 33.5 02/04/2011 1059   RDW 23.6* 07/22/2012 0909   RDW 18.5* 02/04/2011 1059   LYMPHSABS 2.7 06/24/2012 1158   LYMPHSABS 1.1 02/04/2011 1059   MONOABS 0.6 06/24/2012 1158   MONOABS 0.5 02/04/2011 1059   EOSABS 0.1 06/24/2012 1158   EOSABS 0.1 02/04/2011 1059   BASOSABS 0.0 06/24/2012 1158   BASOSABS 0.1 02/04/2011 1059      Chemistry      Component Value Date/Time   NA 139 05/18/2012 1003   NA 136 10/30/2010 0801   K 3.7 05/18/2012 1003   K 4.0 10/30/2010 0801   CL 105 05/18/2012 1003   CL 101 10/30/2010 0801   CO2 26 05/18/2012 1003  CO2 27 10/30/2010 0801   BUN 9 05/18/2012 1003   BUN 9 10/30/2010 0801   CREATININE 0.87 05/18/2012 1003   CREATININE 0.83 02/10/2011 1037      Component Value Date/Time   CALCIUM 8.7 05/18/2012 1003   CALCIUM 8.9 10/30/2010 0801   ALKPHOS 95 03/03/2012 1050   ALKPHOS 89* 10/30/2010 0801   AST 19 03/03/2012 1050   AST 17 10/30/2010 0801   ALT 20 03/03/2012 1050   BILITOT 0.8 03/03/2012 1050   BILITOT 1.00 10/30/2010 0801     Lab Results  Component Value Date   CEA 1.1 07/22/2012     RADIOGRAPHIC STUDIES:  07/12/12  *RADIOLOGY REPORT*  Clinical Data: Follow up DVT.  RIGHT LOWER EXTREMITY VENOUS DUPLEX ULTRASOUND  Technique: Gray-scale sonography with graded compression, as well  as color Doppler and duplex ultrasound, were performed to evaluate  the deep venous system of the lower extremity from the level of the  common femoral vein through the popliteal and proximal calf veins.   Spectral Doppler was utilized to evaluate flow at rest and with  distal augmentation maneuvers.  Comparison: 05/18/2012. 05/07/2011.  Findings: Compared to the presenting examination of 05/07/2011,  there is persistent deep venous thrombosis. The right common  femoral vein remains patent with thrombus extending from popliteal  vein through the superficial femoral vein and into the deep femoral  vein. The saphenous vein is patent. Occlusive thrombus is present  in the superficial femoral vein and popliteal vein.  IMPRESSION:  Compared to the most recent prior examination 05/18/2012, there is  no interval change in the chronic deep venous thrombosis, occlusive  in the superficial femoral and popliteal vein and nonocclusive in  the deep femoral vein. The common femoral vein remains patent,  which was thrombosed at the initial presenting exam 05/07/2011.  Original Report Authenticated By: Andreas Newport, M.D.    ASSESSMENT:  1. Stage III cancer of the rectum status post preoperative radiation and chemotherapy followed by definitive surgery. He then underwent adjuvant chemotherapy with FOLFOX. During the radiation therapy and continuous infusion 5-FU from 09/09/2010 through 10/10/2010. FOLFOX was administered from October 2012 through 07/14/2011. Thus far he has no evidence for recurrent disease.  2.  DVT of right leg. He has been on Coumadin for more than 12 months. His right LE ultrasound in Jan 2014 still showed changes of thrombosis that appear chronic, this was then repeated 8 weeks later on 07/12/2012 to verify this and this too was stable.  As a result, will D/C Coumadin.   3. Stool leakage probably due to chronic radiation therapy irritation and fibrosis potentially. Dr. Kendell Bane following.   4. Impotence without hypotestosteronism   5. Degenerative arthritis of his hands due to his work    PLAN:  1. I personally reviewed and went over laboratory results with the patient. 2. I  personally reviewed and went over radiographic studies with the patient. 3. CEA every 3 months 4. Reviewed NCCN guidelines.  We spent some time reviewing the NCCN guidelines recommending CEAs every 3 months x 2 years (March 2015) and then every 6 months x 3 years.  Will also perform annual CT surveillance. X 5 years (2018). 5. CT abd/pelvis with contrast in November 2014 6. D/C Coumadin 7. Labs in 3 months: CBC diff, CMET, CEA, Iron/TIBC, Ferritin 8. 3 Stool cards in 3 months 9. Hold ASA x 3 months 10. Continue PO iron daily 11. Return in 3 months for follow-up  All questions were answered. The  patient knows to call the clinic with any problems, questions or concerns. We can certainly see the patient much sooner if necessary.  The patient and plan discussed with Glenford Peers, MD and he is in agreement with the aforementioned.  Paulmichael Schreck

## 2012-07-26 ENCOUNTER — Encounter (HOSPITAL_BASED_OUTPATIENT_CLINIC_OR_DEPARTMENT_OTHER): Payer: BC Managed Care – PPO | Admitting: Oncology

## 2012-07-26 ENCOUNTER — Encounter (HOSPITAL_COMMUNITY): Payer: Self-pay | Admitting: Oncology

## 2012-07-26 VITALS — BP 130/76 | HR 90 | Temp 97.9°F | Resp 16 | Wt 223.2 lb

## 2012-07-26 DIAGNOSIS — C189 Malignant neoplasm of colon, unspecified: Secondary | ICD-10-CM

## 2012-07-26 DIAGNOSIS — C20 Malignant neoplasm of rectum: Secondary | ICD-10-CM

## 2012-07-26 DIAGNOSIS — I82409 Acute embolism and thrombosis of unspecified deep veins of unspecified lower extremity: Secondary | ICD-10-CM

## 2012-07-26 DIAGNOSIS — R195 Other fecal abnormalities: Secondary | ICD-10-CM

## 2012-07-26 DIAGNOSIS — I82401 Acute embolism and thrombosis of unspecified deep veins of right lower extremity: Secondary | ICD-10-CM

## 2012-07-26 DIAGNOSIS — E611 Iron deficiency: Secondary | ICD-10-CM

## 2012-07-26 NOTE — Patient Instructions (Addendum)
[facility] Discharge Instructions  RECOMMENDATIONS MADE BY THE CONSULTANT AND ANY TEST RESULTS WILL BE SENT TO YOUR REFERRING PHYSICIAN.  STOP Coumadin. Continue taking oral Iron tablets. Return to clinic in 3 months for lab work and 3 stool cards. MD appointment after 3 month lab work. Report any issues/concerns to clinic as needed prior to appointments.  Thank you for choosing [facility] to provide your oncology and hematology care.  To afford each patient quality time with our providers, please arrive at least 15 minutes before your scheduled appointment time.  With your help, our goal is to use those 15 minutes to complete the necessary work-up to ensure our physicians have the information they need to help with your evaluation and healthcare recommendations.    Effective January 1st, 2014, we ask that you re-schedule your appointment with our physicians should you arrive 10 or more minutes late for your appointment.  We strive to give you quality time with our providers, and arriving late affects you and other patients whose appointments are after yours.    Again, thank you for choosing [facility].  Our hope is that these requests will decrease the amount of time that you wait before being seen by our physicians.       _____________________________________________________________  Should you have questions after your visit to [facility], please contact our office at [phone] between the hours of 8:30 a.m. and 5:00 p.m.  Voicemails left after 4:30 p.m. will not be returned until the following business day.  For prescription refill requests, have your pharmacy contact our office with your prescription refill request.

## 2012-10-26 ENCOUNTER — Other Ambulatory Visit (HOSPITAL_COMMUNITY): Payer: Self-pay | Admitting: Oncology

## 2012-10-26 ENCOUNTER — Encounter (HOSPITAL_COMMUNITY): Payer: BC Managed Care – PPO | Attending: Oncology

## 2012-10-26 DIAGNOSIS — R195 Other fecal abnormalities: Secondary | ICD-10-CM

## 2012-10-26 DIAGNOSIS — E611 Iron deficiency: Secondary | ICD-10-CM

## 2012-10-26 DIAGNOSIS — C189 Malignant neoplasm of colon, unspecified: Secondary | ICD-10-CM | POA: Insufficient documentation

## 2012-10-26 LAB — COMPREHENSIVE METABOLIC PANEL
ALT: 19 U/L (ref 0–53)
AST: 19 U/L (ref 0–37)
Albumin: 3.5 g/dL (ref 3.5–5.2)
Alkaline Phosphatase: 96 U/L (ref 39–117)
BUN: 13 mg/dL (ref 6–23)
CO2: 25 mEq/L (ref 19–32)
Calcium: 9.3 mg/dL (ref 8.4–10.5)
Chloride: 103 mEq/L (ref 96–112)
Creatinine, Ser: 0.79 mg/dL (ref 0.50–1.35)
GFR calc Af Amer: >90 mL/min (ref >90)
GFR calc non Af Amer: >90 mL/min (ref >90)
Glucose, Bld: 108 mg/dL — ABNORMAL HIGH (ref 70–99)
Potassium: 3.8 mEq/L (ref 3.5–5.1)
Sodium: 141 mEq/L (ref 135–145)
Total Bilirubin: 1.1 mg/dL (ref 0.3–1.2)
Total Protein: 6.5 g/dL (ref 6.0–8.3)

## 2012-10-26 LAB — CBC WITH DIFFERENTIAL/PLATELET
Basophils Absolute: 0 10*3/uL (ref 0.0–0.1)
Basophils Relative: 1 % (ref 0–1)
Eosinophils Absolute: 0.2 10*3/uL (ref 0.0–0.7)
Eosinophils Relative: 4 % (ref 0–5)
HCT: 44.2 % (ref 39.0–52.0)
Hemoglobin: 15 g/dL (ref 13.0–17.0)
Lymphocytes Relative: 35 % (ref 12–46)
Lymphs Abs: 1.9 10*3/uL (ref 0.7–4.0)
MCH: 29.3 pg (ref 26.0–34.0)
MCHC: 33.9 g/dL (ref 30.0–36.0)
MCV: 86.3 fL (ref 78.0–100.0)
Monocytes Absolute: 0.5 10*3/uL (ref 0.1–1.0)
Monocytes Relative: 10 % (ref 3–12)
Neutro Abs: 2.8 10*3/uL (ref 1.7–7.7)
Neutrophils Relative %: 51 % (ref 43–77)
Platelets: 212 10*3/uL (ref 150–400)
RBC: 5.12 MIL/uL (ref 4.22–5.81)
RDW: 14.3 % (ref 11.5–15.5)
WBC: 5.5 10*3/uL (ref 4.0–10.5)

## 2012-10-26 LAB — FERRITIN: Ferritin: 40 ng/mL (ref 22–322)

## 2012-10-26 LAB — IRON AND TIBC
Iron: 96 ug/dL (ref 42–135)
Saturation Ratios: 28 % (ref 20–55)
TIBC: 337 ug/dL (ref 215–435)
UIBC: 241 ug/dL (ref 125–400)

## 2012-10-26 NOTE — Progress Notes (Signed)
Labs drawn today for cbc/diff,cmp,Iron and IBC, ferr 

## 2012-10-27 ENCOUNTER — Ambulatory Visit (HOSPITAL_COMMUNITY): Payer: BC Managed Care – PPO | Admitting: Oncology

## 2012-10-28 ENCOUNTER — Other Ambulatory Visit (HOSPITAL_COMMUNITY): Payer: Self-pay | Admitting: *Deleted

## 2012-10-28 LAB — CEA: CEA: 1.4 ng/mL (ref 0.0–5.0)

## 2012-10-28 LAB — OCCULT BLOOD X 1 CARD TO LAB, STOOL
Fecal Occult Bld: NEGATIVE
Fecal Occult Bld: NEGATIVE
Fecal Occult Bld: NEGATIVE

## 2012-10-28 NOTE — Addendum Note (Signed)
Addended byLeida Lauth on: 10/28/2012 09:30 AM   Modules accepted: Orders

## 2012-11-02 ENCOUNTER — Ambulatory Visit (HOSPITAL_COMMUNITY): Payer: BC Managed Care – PPO

## 2012-11-04 ENCOUNTER — Encounter (HOSPITAL_COMMUNITY): Payer: Self-pay

## 2012-11-04 ENCOUNTER — Encounter (HOSPITAL_COMMUNITY): Payer: BC Managed Care – PPO | Attending: Internal Medicine

## 2012-11-04 VITALS — BP 120/73 | HR 76 | Temp 97.6°F | Resp 18 | Wt 227.2 lb

## 2012-11-04 DIAGNOSIS — D469 Myelodysplastic syndrome, unspecified: Secondary | ICD-10-CM

## 2012-11-04 DIAGNOSIS — C189 Malignant neoplasm of colon, unspecified: Secondary | ICD-10-CM | POA: Insufficient documentation

## 2012-11-04 DIAGNOSIS — D649 Anemia, unspecified: Secondary | ICD-10-CM | POA: Insufficient documentation

## 2012-11-04 NOTE — Progress Notes (Signed)
Problem number 1 stage III cancer of the rectum status post preoperative radiation and chemotherapy followed by definitive surgery. He then underwent adjuvant chemotherapy with FOLFOX. During the radiation therapy and continuous infusion 5-FU from 09/09/2010 through 10/10/2010. FOLFOX was administered from October 2012 through 07/14/2011. Thus far he has no evidence for recurrent disease. Problem #2 DVT more so the right leg than the left. He has been on Coumadin for more than 12 months. His recent r ultrasound still show changes of thrombosis that appear chronic but we will repeat one in 8 weeks to verify this and then stop Coumadin if all is stable. Problem #3 stool leakage probably due to chronic radiation therapy irritation and fibrosis potentially that is improved with what Dr. Kendell Bane has done for him. Problem #4 impotence which you brought today for the first time and we will check his testosterone levels and consider testosterone replacement if need be plus or minus Viagra. Problem #5 degenerative arthritis of his hands due to his work  HPI: Patient returns for scheduled oncology follow-up, he was seen few months ago. States he had Colonoscopy and a CT scan done few months ago which was reportedly unremarkable. Main complaint is symptoms of neuropathy is worse in late afternoon and has tingling and numbness from both knees downwards, takes Gabapentin BID only. Denies alcohol intake. Denies h/o diabetes. Otherwise no other new complaints. No recurrent swelling or pain in lower extremities.  ROS: No fevers or night sweats. No sore throat or dysphagia. No new cough, chest pain, SOB or hemoptysis. No orthopnea or PND. No abdominal pain, urinary symptoms or bowel disturbances. No skin rash or bleeding issues. No headaches or focal weakness.    Exam: Vital signs are recorded, stable. HEENT - EOMs intact. No oral thrush. No cervical adenopathy. Lungs are clear. Heart shows a regular rhythm and rate without  murmur rub or gallop. Abdomen remains soft and nontender scars are well-healed. He has no hepatosplenomegaly. Bowel sounds are normal. Ext - no edema or rash. Neuro - cranial nerves intact, nonfocal, gait is untremarkable.  Relevant Lab: 10/26/12 - Cr 0.79, LFT normal, Fe study normal range, Hb 15, CEA 1.4  Patient continues to be in clinical remission from colon cancer. Regarding neuropathy symptoms, have advised him to take addtional Gabapentin 600 mg dose around noon (in addtion to ongoing dose of 900 mg BID) and notify us if symptoms not better controlled. Also advised to add oral B12 and Vitamin E. He will retruns for followup in 4 months with labs including CBC/diff, met-C, iron study, serum CEA. In between visits, he was advised to call in case of new symptoms and will need to be evaluated sooner. Patient agreeable to this plan.

## 2012-11-04 NOTE — Patient Instructions (Addendum)
[facility] Discharge Instructions  RECOMMENDATIONS MADE BY THE CONSULTANT AND ANY TEST RESULTS WILL BE SENT TO YOUR REFERRING PHYSICIAN.  EXAM FINDINGS BY THE PHYSICIAN TODAY AND SIGNS OR SYMPTOMS TO REPORT TO CLINIC OR PRIMARY PHYSICIAN: Exam and discussion by Dr. Sherrlyn Hock   MEDICATIONS PRESCRIBED:  Increase your gabapentin by adding an additional 600mg  at 12noon Try Vitamin  B12 and Vitamin E  INSTRUCTIONS GIVEN AND DISCUSSED: Report changes in bowel changes, blood in your stool or other problems.  SPECIAL INSTRUCTIONS/FOLLOW-UP: Blood work and follow- up in 4 months.  Thank you for choosing [facility] to provide your oncology and hematology care.  To afford each patient quality time with our providers, please arrive at least 15 minutes before your scheduled appointment time.  With your help, our goal is to use those 15 minutes to complete the necessary work-up to ensure our physicians have the information they need to help with your evaluation and healthcare recommendations.    Effective January 1st, 2014, we ask that you re-schedule your appointment with our physicians should you arrive 10 or more minutes late for your appointment.  We strive to give you quality time with our providers, and arriving late affects you and other patients whose appointments are after yours.    Again, thank you for choosing [facility].  Our hope is that these requests will decrease the amount of time that you wait before being seen by our physicians.       _____________________________________________________________  Should you have questions after your visit to [facility], please contact our office at [phone] between the hours of 8:30 a.m. and 5:00 p.m.  Voicemails left after 4:30 p.m. will not be returned until the following business day.  For prescription refill requests, have your pharmacy contact our office with your prescription  refill request.

## 2012-12-13 ENCOUNTER — Encounter: Payer: Self-pay | Admitting: Internal Medicine

## 2013-03-07 ENCOUNTER — Encounter (HOSPITAL_COMMUNITY): Payer: Medicare Other | Attending: Hematology and Oncology

## 2013-03-07 DIAGNOSIS — D649 Anemia, unspecified: Secondary | ICD-10-CM | POA: Insufficient documentation

## 2013-03-07 DIAGNOSIS — C189 Malignant neoplasm of colon, unspecified: Secondary | ICD-10-CM

## 2013-03-07 DIAGNOSIS — Z85038 Personal history of other malignant neoplasm of large intestine: Secondary | ICD-10-CM | POA: Insufficient documentation

## 2013-03-07 DIAGNOSIS — Z09 Encounter for follow-up examination after completed treatment for conditions other than malignant neoplasm: Secondary | ICD-10-CM | POA: Insufficient documentation

## 2013-03-07 LAB — CBC WITH DIFFERENTIAL/PLATELET
Basophils Absolute: 0 K/uL (ref 0.0–0.1)
Basophils Relative: 1 % (ref 0–1)
Eosinophils Absolute: 0.2 K/uL (ref 0.0–0.7)
Eosinophils Relative: 3 % (ref 0–5)
HCT: 45.6 % (ref 39.0–52.0)
Hemoglobin: 15.8 g/dL (ref 13.0–17.0)
Lymphocytes Relative: 36 % (ref 12–46)
Lymphs Abs: 2.4 K/uL (ref 0.7–4.0)
MCH: 30.6 pg (ref 26.0–34.0)
MCHC: 34.6 g/dL (ref 30.0–36.0)
MCV: 88.2 fL (ref 78.0–100.0)
Monocytes Absolute: 0.5 K/uL (ref 0.1–1.0)
Monocytes Relative: 7 % (ref 3–12)
Neutro Abs: 3.5 K/uL (ref 1.7–7.7)
Neutrophils Relative %: 53 % (ref 43–77)
Platelets: 219 K/uL (ref 150–400)
RBC: 5.17 MIL/uL (ref 4.22–5.81)
RDW: 13 % (ref 11.5–15.5)
WBC: 6.6 K/uL (ref 4.0–10.5)

## 2013-03-07 LAB — COMPREHENSIVE METABOLIC PANEL WITH GFR
ALT: 24 U/L (ref 0–53)
AST: 22 U/L (ref 0–37)
Albumin: 3.6 g/dL (ref 3.5–5.2)
Alkaline Phosphatase: 85 U/L (ref 39–117)
BUN: 12 mg/dL (ref 6–23)
CO2: 27 meq/L (ref 19–32)
Calcium: 9.2 mg/dL (ref 8.4–10.5)
Chloride: 103 meq/L (ref 96–112)
Creatinine, Ser: 0.83 mg/dL (ref 0.50–1.35)
GFR calc Af Amer: >90 mL/min
GFR calc non Af Amer: >90 mL/min
Glucose, Bld: 159 mg/dL — ABNORMAL HIGH (ref 70–99)
Potassium: 4.4 meq/L (ref 3.5–5.1)
Sodium: 139 meq/L (ref 135–145)
Total Bilirubin: 1.2 mg/dL (ref 0.3–1.2)
Total Protein: 6.6 g/dL (ref 6.0–8.3)

## 2013-03-07 LAB — IRON AND TIBC
Iron: 136 ug/dL — ABNORMAL HIGH (ref 42–135)
Saturation Ratios: 35 % (ref 20–55)
TIBC: 385 ug/dL (ref 215–435)
UIBC: 249 ug/dL (ref 125–400)

## 2013-03-07 LAB — FERRITIN: Ferritin: 26 ng/mL (ref 22–322)

## 2013-03-07 NOTE — Progress Notes (Signed)
Labs drawn today for cea,Iron and IBC,ferr,cbc/diff,cmp

## 2013-03-08 LAB — CEA: CEA: 1.4 ng/mL (ref 0.0–5.0)

## 2013-03-14 ENCOUNTER — Ambulatory Visit (HOSPITAL_COMMUNITY): Payer: BC Managed Care – PPO | Admitting: Oncology

## 2013-03-14 ENCOUNTER — Ambulatory Visit (HOSPITAL_COMMUNITY)
Admission: RE | Admit: 2013-03-14 | Discharge: 2013-03-14 | Disposition: A | Discharge status: Home / Self Care | Payer: Medicare Other | Source: Ambulatory Visit | Attending: Oncology | Admitting: Oncology

## 2013-03-14 DIAGNOSIS — Z9889 Other specified postprocedural states: Secondary | ICD-10-CM | POA: Insufficient documentation

## 2013-03-14 DIAGNOSIS — C189 Malignant neoplasm of colon, unspecified: Secondary | ICD-10-CM

## 2013-03-14 DIAGNOSIS — Z85038 Personal history of other malignant neoplasm of large intestine: Secondary | ICD-10-CM | POA: Insufficient documentation

## 2013-03-14 DIAGNOSIS — C20 Malignant neoplasm of rectum: Secondary | ICD-10-CM | POA: Insufficient documentation

## 2013-03-14 MED ORDER — IOHEXOL 300 MG/ML  SOLN
100.0000 mL | Freq: Once | INTRAMUSCULAR | Status: AC | PRN
  Administered 2013-03-14: 100 mL via INTRAVENOUS

## 2013-03-14 NOTE — Progress Notes (Signed)
VYAS,[name] B., MD [address] [place] [address]  Colon cancer - Plan: CBC with Differential, Basic metabolic panel, Iron and TIBC, Ferritin, CEA, CT Abdomen Pelvis W Contrast  Anemia - Plan: CBC with Differential, Basic metabolic panel, Iron and TIBC, Ferritin  Peripheral neuropathy - Plan: gabapentin (NEURONTIN) 300 MG capsule  Preventative health care - Plan: influenza vac split quadrivalent PF (FLUARIX) injection 0.5 mL  CURRENT THERAPY: Surveillance per NCCN guidelines  INTERVAL HISTORY: Charles [name] 63 y.o. male returns for  regular  visit for followup of stage III cancer of the rectum status post preoperative radiation and chemotherapy followed by definitive surgery. He then underwent adjuvant chemotherapy with FOLFOX. During the radiation therapy and continuous infusion 5-FU from 09/09/2010 through 10/10/2010. FOLFOX was administered from October 2012 through 07/14/2011. Thus far he has no evidence for recurrent disease.  I personally reviewed and went over laboratory results with the patient.  Labs performed on 03/07/2013, shows a WBC of 6.6, Hgb 15.8 g/dL, and platelet count of 219,000.  CMET is unimpressive except for hyperglycemia of 159, but this was not fasting labs.  Iron studies show a serum iron of 136, with a TIBC of 385, and ferritin of 26.  CEA is WNL at 1.4.  I personally reviewed and went over radiographic studies with the patient.  CT abd/pelvis performed on 03/14/2013 demonstrates No recurrent or metastatic disease identified.  We reviewed NCCN guidelines for surveillance.  NCCN guidelines for surveillance for rectal cancer are:   A. H+P every 3-6 months x 2 years and then every 6 months for a total of 5 years   B. CEA every 3 months x 2 years and then every 6 months for a total of 5 years   C. CT abd/pelvis annually for up to 5 years   D. Colonoscopy in 1 year except if no preoperative colonoscopy due to obstructing lesion, colonoscopy in 3-6 months.      1. If advanced adenoma, repeat in 1 year   2. If no advanced adenoma, repeat in 3 years, then every 5 years  E. PET scan not routinely recommended.   He denies any complaints and oncologic ROS questioning is negative.  He is appreciative of the education.   Past Medical History  Diagnosis Date  . HTN (hypertension)   . Pulmonary embolism   . GERD (gastroesophageal reflux disease)   . Depression 04/01/2011  . DVT (deep venous thrombosis) 05/09/2011  . 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 Colon cancer 07/24/10    rectal ca, inv adenocarcinoma  . Hx of radiation therapy 09/02/10 to 10/14/10    pelvis  . Rectal cancer 01/15/2011    S/P radiation and concurrent 5-FU continuous infusion from 09/09/10- 10/10/10.  S/P proctectomy with colorectal anastomosis and diverting loop ileostomy on 11/14/10 at [facility] by Dr. Rae Halsted. Pathology reveals a pT3b N1 with 3/20 lymph nodes.      has GERD; DIARRHEA; Rectal cancer; Weight loss, abnormal; Dehydration; Depression; DVT (deep venous thrombosis); High output ileostomy; Hx of radiation therapy; Heme positive stool; and Anemia on his problem list.     has No Known Allergies.  [name] had no medications administered during this visit.  Past Surgical History  Procedure Laterality Date  . Esophagogastroduodenoscopy  07/2010    RMR: schatki ring s/p dilation, small hh, SB bx benign  . Colonoscopy  07/2010    proximal rectal apple core mass 10-14cm from anal verge (adenocarcinoma), 2-3cm distal rectal carpet  polyp s/p piecemeal snare polypectomy (adenoma)  . Eus  08/2010    Dr. Rob Bunting. uT3N0 circumferential, nearly obstruction rectosigmoid adenocarcinoma, distal edge 12cm from anal verge  . Ivc filter    . Colon surgery  11/14/2010    proctectomy with colorectal anastomosis and diverting loop ileostomy (temporary planned)  . Port a cath placement    . Colostomy reversal  april 2013  . Port-a-cath removal  09/24/2011    Procedure: REMOVAL  PORT-A-CATH;  Surgeon: Fabio Bering, MD;  Location: [facility];  Service: General;  Laterality: N/A;  Minor Room  . Ivc filter    . Colonoscopy  04/21/2012    RMR: Friable,fibrotic appearing colorectal anastomosis producing some luminal narrowing-not felt to be critical. path: focal erosion with slight inflammation and hyperemia. SURVEILLANCE DUE DEC 2015    Denies any headaches, dizziness, double vision, fevers, chills, night sweats, nausea, vomiting, diarrhea, constipation, chest pain, heart palpitations, shortness of breath, blood in stool, black tarry stool, urinary pain, urinary burning, urinary frequency, hematuria.   PHYSICAL EXAMINATION  ECOG PERFORMANCE STATUS: 0 - Asymptomatic  Filed Vitals:   03/15/13 1015  BP: 129/80  Pulse: 73  Temp: 97.6 F (36.4 C)  Resp: 18    GENERAL:alert, healthy, no distress, well nourished, well developed, comfortable, cooperative, obese and smiling SKIN: skin color, texture, turgor are normal, no rashes or significant lesions HEAD: Normocephalic, No masses, lesions, tenderness or abnormalities EYES: normal, PERRLA, EOMI, Conjunctiva are pink and non-injected EARS: External ears normal OROPHARYNX:mucous membranes are moist  NECK: supple, no adenopathy, thyroid normal size, non-tender, without nodularity, no stridor, non-tender, trachea midline LYMPH:  no palpable lymphadenopathy, no hepatosplenomegaly BREAST:not examined LUNGS: clear to auscultation and percussion HEART: regular rate & rhythm, no murmurs, no gallops, S1 normal and S2 normal ABDOMEN:abdomen soft, non-tender, obese, normal bowel sounds, no masses or organomegaly and no hepatosplenomegaly BACK: Back symmetric, no curvature., No CVA tenderness EXTREMITIES:less then 2 second capillary refill, no joint deformities, effusion, or inflammation, no edema, no skin discoloration, no clubbing, no cyanosis  NEURO: alert & oriented x 3 with fluent speech, no focal motor/sensory deficits, gait  normal    LABORATORY DATA: CBC    Component Value Date/Time   WBC 6.6 03/07/2013 0826   WBC 6.5 02/04/2011 1059   RBC 5.17 03/07/2013 0826   RBC 4.72 05/21/2012 1015   RBC 4.57 02/04/2011 1059   HGB 15.8 03/07/2013 0826   HGB 11.6* 02/04/2011 1059   HCT 45.6 03/07/2013 0826   HCT 34.7* 02/04/2011 1059   PLT 219 03/07/2013 0826   PLT 282 02/04/2011 1059   MCV 88.2 03/07/2013 0826   MCV 76.0* 02/04/2011 1059   MCH 30.6 03/07/2013 0826   MCH 25.4* 02/04/2011 1059   MCHC 34.6 03/07/2013 0826   MCHC 33.5 02/04/2011 1059   RDW 13.0 03/07/2013 0826   RDW 18.5* 02/04/2011 1059   LYMPHSABS 2.4 03/07/2013 0826   LYMPHSABS 1.1 02/04/2011 1059   MONOABS 0.5 03/07/2013 0826   MONOABS 0.5 02/04/2011 1059   EOSABS 0.2 03/07/2013 0826   EOSABS 0.1 02/04/2011 1059   BASOSABS 0.0 03/07/2013 0826   BASOSABS 0.1 02/04/2011 1059      Chemistry      Component Value Date/Time   NA 139 03/07/2013 0826   NA 136 10/30/2010 0801   K 4.4 03/07/2013 0826   K 4.0 10/30/2010 0801   CL 103 03/07/2013 0826   CL 101 10/30/2010 0801   CO2 27 03/07/2013 0826  CO2 27 10/30/2010 0801   BUN 12 03/07/2013 0826   BUN 9 10/30/2010 0801   CREATININE 0.83 03/07/2013 0826   CREATININE 0.83 02/10/2011 1037      Component Value Date/Time   CALCIUM 9.2 03/07/2013 0826   CALCIUM 8.9 10/30/2010 0801   ALKPHOS 85 03/07/2013 0826   ALKPHOS 89* 10/30/2010 0801   AST 22 03/07/2013 0826   AST 17 10/30/2010 0801   ALT 24 03/07/2013 0826   ALT 13 10/30/2010 0801   BILITOT 1.2 03/07/2013 0826   BILITOT 1.00 10/30/2010 0801     Lab Results  Component Value Date   CEA 1.4 03/07/2013      RADIOGRAPHIC STUDIES:  03/14/2013  CLINICAL DATA: 62-year- male with history of colon cancer status  post surgery chemotherapy and radiation. Surveillance of stage III  rectal cancer. Subsequent encounter.  EXAM:  CT ABDOMEN AND PELVIS WITH CONTRAST  TECHNIQUE:  Multidetector CT imaging of the abdomen and pelvis was performed  using the standard protocol  following bolus administration of  intravenous contrast.  CONTRAST: OMNIPAQUE IOHEXOL 300 MG/ML SOLN  COMPARISON: 03/08/2012 and earlier.  FINDINGS:  Mildly increased atelectasis at both lung bases. No pleural or  pericardial effusion. No lung base pulmonary nodule identified.  No acute or suspicious osseous lesion identified.  Pelvic and presacral stranding has mildly diminished, probably  sequelae of XRT. Distal colon anastomosis appears stable with no  adverse features. Small oval dense probable vitamin tablet in this  region. Small fact containing inguinal hernias are stable.  Diminutive bladder. No perirectal or pelvic lymphadenopathy  identified.  Negative left colon. Oral contrast has reached the distal transverse  colon. Negative transverse colon and hepatic flexure. Cecum on a lax  mesenteries as before. Terminal ileum within normal limits.  Persistent distal small bowel appearance in the right lower quadrant  in the area of previous ostomy take-down, with a possible area of  mild distal small bowel stenosis and upstream capacious loop (series  2, images 54-57). Small bowel anastomosis immediately upstream of  that loop. Overall, no evidence of small bowel obstruction. Negative  stomach and duodenum.  Chronic hepatic steatosis. The entire liver dome is included today  on the delayed renal excretory images. No discrete liver lesion.  Negative gallbladder, spleen, pancreas, adrenal glands (chronic left  adrenal dystrophic calcification) and kidneys. Portal venous system  within normal limits. IVC filter appears stable and within normal  limits. Major arterial structures in the abdomen and pelvis are  stable. No abdominal free fluid. No lymphadenopathy identified.  IMPRESSION:  Stable postoperative and posttherapy appearance of the abdomen and  pelvis. No recurrent or metastatic disease identified.  Electronically Signed  By: Augusto Gamble M.D.  On: 03/14/2013 10:23       ASSESSMENT:  1. Stage III cancer of the rectum status post preoperative radiation and chemotherapy followed by definitive surgery. He then underwent adjuvant chemotherapy with FOLFOX. During the radiation therapy and continuous infusion 5-FU from 09/09/2010 through 10/10/2010. FOLFOX was administered from October 2012 through 07/14/2011. Thus far he has no evidence for recurrent disease.  2. DVT of right leg. He is S/P Coumadin for more than 12 months. His right LE ultrasound in Jan 2014 still showed changes of thrombosis that appear chronic, this was then repeated 8 weeks later on 07/12/2012 to verify this and this too was stable. As a result, Coumadin D/C'd on 07/26/2012. 3. Stool leakage probably due to chronic radiation therapy irritation and fibrosis potentially. Dr. Kendell Bane following.  4. Impotence without hypotestosteronism  5. Degenerative arthritis of his hands due to his work  6. Grade 1 peripheral neuropathy- chemotherapy-induced.  On gabapentin with improvement in symptoms.  7. Pneumovax vaccine last given in April 2012.  Will be due in April 2017.  Patient Active Problem List   Diagnosis Date Noted  . Heme positive stool 06/16/2012  . Anemia 06/16/2012  . Hx of radiation therapy   . 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 DVT (deep venous thrombosis) 05/09/2011  . Depression 04/01/2011  . Dehydration 03/10/2011  . Rectal cancer 01/15/2011  . Weight loss, abnormal 01/15/2011  . GERD 07/11/2010  . DIARRHEA 07/11/2010     PLAN:  1. I personally reviewed and went over laboratory results with the patient. 2. I personally reviewed and went over radiographic studies with the patient. 3. Continue NCCN guidelines for surveillance. 4. CEA every 3 months through March 2015.  Then every 6 months x 3 more years. 5. CT abd/pelvis in 1 year for surveillance 6. Influenza vaccine today 7. Labs in 3 months: CBC diff, BMET, Iron/TIBC 8. Refill on Gabapentin 9. Review of NCCN guidelines.  10.  Return in 4 months for follow-up.   THERAPY PLAN:  We will continue to follow NCCN guidelines for surveillance.   NCCN guidelines for surveillance for rectal cancer are:   A. H+P every 3-6 months x 2 years and then every 6 months for a total of 5 years   B. CEA every 3 months x 2 years and then every 6 months for a total of 5 years   C. CT abd/pelvis annually for up to 5 years   D. Colonoscopy in 1 year except if no preoperative colonoscopy due to obstructing lesion, colonoscopy in 3-6 months.    1. If advanced adenoma, repeat in 1 year   2. If no advanced adenoma, repeat in 3 years, then every 5 years  E. PET scan not routinely recommended.   All questions were answered. The patient knows to call the clinic with any problems, questions or concerns. We can certainly see the patient much sooner if necessary.  Patient and plan discussed with Dr. Annamarie Dawley and she is in agreement with the aforementioned.   KEFALAS,THOMAS

## 2013-03-15 ENCOUNTER — Encounter (HOSPITAL_COMMUNITY): Payer: Self-pay | Admitting: Oncology

## 2013-03-15 ENCOUNTER — Encounter (HOSPITAL_BASED_OUTPATIENT_CLINIC_OR_DEPARTMENT_OTHER): Payer: Medicare Other | Admitting: Oncology

## 2013-03-15 VITALS — BP 129/80 | HR 73 | Temp 97.6°F | Resp 18 | Wt 231.5 lb

## 2013-03-15 DIAGNOSIS — G629 Polyneuropathy, unspecified: Secondary | ICD-10-CM

## 2013-03-15 DIAGNOSIS — G620 Drug-induced polyneuropathy: Secondary | ICD-10-CM

## 2013-03-15 DIAGNOSIS — Z0000 Encounter for general adult medical examination without abnormal findings: Secondary | ICD-10-CM

## 2013-03-15 DIAGNOSIS — Z23 Encounter for immunization: Secondary | ICD-10-CM

## 2013-03-15 DIAGNOSIS — C20 Malignant neoplasm of rectum: Secondary | ICD-10-CM

## 2013-03-15 DIAGNOSIS — D649 Anemia, unspecified: Secondary | ICD-10-CM

## 2013-03-15 DIAGNOSIS — C189 Malignant neoplasm of colon, unspecified: Secondary | ICD-10-CM

## 2013-03-15 MED ORDER — INFLUENZA VAC SPLIT QUAD 0.5 ML IM SUSP
0.5000 mL | Freq: Once | INTRAMUSCULAR | Status: AC
  Administered 2013-03-15: 0.5 mL via INTRAMUSCULAR
  Filled 2013-03-15: qty 0.5

## 2013-03-15 MED ORDER — GABAPENTIN 300 MG PO CAPS: ORAL_CAPSULE | ORAL | Status: DC

## 2013-03-15 NOTE — Patient Instructions (Signed)
[facility] Discharge Instructions  RECOMMENDATIONS MADE BY THE CONSULTANT AND ANY TEST RESULTS WILL BE SENT TO YOUR REFERRING PHYSICIAN.  EXAM FINDINGS BY THE PHYSICIAN TODAY AND SIGNS OR SYMPTOMS TO REPORT TO CLINIC OR PRIMARY PHYSICIAN: Exam and findings as discussed by Dellis Anes, PA-C.You are doing well. Will give you a flu vaccine today.  MEDICATIONS PRESCRIBED:  none  INSTRUCTIONS/FOLLOW-UP: Blood work every 3 months and follow-up in 4 months.  Thank you for choosing [facility] to provide your oncology and hematology care.  To afford each patient quality time with our providers, please arrive at least 15 minutes before your scheduled appointment time.  With your help, our goal is to use those 15 minutes to complete the necessary work-up to ensure our physicians have the information they need to help with your evaluation and healthcare recommendations.    Effective January 1st, 2014, we ask that you re-schedule your appointment with our physicians should you arrive 10 or more minutes late for your appointment.  We strive to give you quality time with our providers, and arriving late affects you and other patients whose appointments are after yours.    Again, thank you for choosing [facility].  Our hope is that these requests will decrease the amount of time that you wait before being seen by our physicians.       _____________________________________________________________  Should you have questions after your visit to [facility], please contact our office at [phone] between the hours of 8:30 a.m. and 5:00 p.m.  Voicemails left after 4:30 p.m. will not be returned until the following business day.  For prescription refill requests, have your pharmacy contact our office with your prescription refill request.

## 2013-03-15 NOTE — Progress Notes (Signed)
Charles [name] presents today for injection per MD orders. Flu vaccine administered IM in left Upper Arm. Administration without incident. Patient tolerated well.

## 2013-03-28 ENCOUNTER — Other Ambulatory Visit (HOSPITAL_COMMUNITY): Payer: BC Managed Care – PPO

## 2013-06-14 ENCOUNTER — Other Ambulatory Visit (HOSPITAL_COMMUNITY): Payer: Medicare Other

## 2013-06-20 ENCOUNTER — Other Ambulatory Visit (HOSPITAL_COMMUNITY): Payer: Self-pay | Admitting: Oncology

## 2013-06-20 ENCOUNTER — Telehealth (HOSPITAL_COMMUNITY): Payer: Self-pay

## 2013-06-20 DIAGNOSIS — G629 Polyneuropathy, unspecified: Secondary | ICD-10-CM

## 2013-06-20 MED ORDER — GABAPENTIN 300 MG PO CAPS: ORAL_CAPSULE | ORAL | Status: DC

## 2013-06-20 NOTE — Telephone Encounter (Signed)
Patient's wife Hassan Rowan) called requested that we call the gabapentin prescription in to [facility] in [place].  Pa Tom notified.

## 2013-07-12 NOTE — Progress Notes (Signed)
[name], MD [place] [address]  Rectal cancer - Plan: CBC with Differential, Comprehensive metabolic panel, Iron and TIBC, Ferritin, CBC with Differential, Comprehensive metabolic panel, Ferritin, CBC with Differential, Comprehensive metabolic panel, Iron and TIBC, Ferritin  Heme positive stool - Plan: Iron and TIBC, Ferritin, Ferritin, Iron and TIBC, Ferritin  Colon cancer - Plan: CEA  CURRENT THERAPY: Surveillance per NCCN guidelines  INTERVAL HISTORY: Charles [name] 63 y.o. male returns for  regular  visit for followup of stage III cancer of the rectum status post preoperative radiation and chemotherapy followed by definitive surgery. He then underwent adjuvant chemotherapy with FOLFOX. During the radiation therapy and continuous infusion 5-FU from 09/09/2010 through 10/10/2010. FOLFOX was administered from October 2012 through 07/14/2011. Thus far he has no evidence for recurrent disease.    Rectal cancer   07/24/2010 Initial Diagnosis Invasive adenocarcinoma of rectum   09/09/2010 Concurrent Chemotherapy S/P radiation and concurrent 5-FU continuous infusion from 09/09/10- 10/10/10.   11/14/2010 Surgery S/P proctectomy with colorectal anastomosis and diverting loop ileostomy on 11/14/10 at [facility] by Dr. Harlon Ditty. Pathology reveals a pT3b N1 with 3/20 lymph nodes.   02/05/2011 - 07/14/2011 Chemotherapy FOLFOX   08/18/2011 Surgery Approximate date of surgery- [place] by Dr. Harlon Ditty    Remission     I personally reviewed and went over laboratory results with the patient.  The results are noted within this dictation.  NCCN guidelines for surveillance for T3/T4, N0-2, M0 colon cancer are:   A. H+P every 3-6 months x 2 years and then every 6 months for a total of 5 years   B. CEA every 3 months x 2 years and then every 6 months for a total of 5 years   C. CT abd/pelvis annually for up to 5 years   D. Colonoscopy in 1 year except if no preoperative  colonoscopy due to obstructing lesion, colonoscopy in 3-6 months.    1. If advanced adenoma, repeat in 1 year   2. If no advanced adenoma, repeat in 3 years, then every 5 years  E. PET scan not routinely recommended.  The patient is doing very well from an oncology standpoint. Today is his 2 year anniversary of the completion of chemotherapy. Therefore, he has graduated with regards to the NCCN guidelines pertaining to surveillance of stage III colorectal cancer. We will perform laboratory work every 6 months and see him every 6 months with annual CT imaging per guidelines.   He does make mention of one complaint that he had on Sunday which I will defer to his primary care physician. That complaint is as follows: He reports on Sunday that he had a right occipital/parietal headache. He reports it was a pinpoint headache in the sense that he could use on finger to point exactly to wear the discomfort was. He notes that he got up from his chair with his headache and walked about 20 feet. He became dizzy and weak and his wife came to his assistance. She helped him to the restroom where he had a bowel movement and urinated. He was continent of both of these. He denies any loss of consciousness. Following his bathroom break, he went to bed and slept the night. He woke up the following morning a little groggy and weak which resolved quickly throughout the following day.  During this episode, the patient denies any slurring of speech, memory issues, weakness on one side or unilateral limb weakness, facial  asymmetry, etc.  With this information, I asked him to follow up with his primary care physician as this may be a sign of a possible TIA. Metastatic disease is very unlikely given in stage III disease. He also has a family history of TIAs and strokes in his father, brother, and sister. An MRI may be indicated but I will defer this to his primary care physician. He is not on any anticoagulation.  He questioned  how long he needs to remain on his gabapentin. I provided patient education regarding peripheral neuropathy related to FOLFOX chemotherapy. He been on gabapentin for nearly 2 years. I've given him options including continuing this medication or slowly decreasing in discontinuing the medication. Gabapentin can always be reinitiated in the future if his symptomatology worsens when he saw for medication. I've given him the liberty to do as he wishes with this medication. I think after 2 years of therapy to maximize the benefit he will gain from gabapentin, and therefore, discontinuation is certainly appropriate he wishes. I've given him education regarding slow taper of the medication with a decrease of one capsule every 2 days until completely off the medication. Additionally, given medication with regards to reinitiating this intervention in the future if necessary by increasing dose by 1 capsule every 2 days until back up to his original dose.  Oncologically, the patient denies any complaints and ROS questioning is negative.   Past Medical History  Diagnosis Date  . HTN (hypertension)   . Pulmonary embolism   . GERD (gastroesophageal reflux disease)   . Depression 04/01/2011  . DVT (deep venous thrombosis) 05/09/2011  . 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 Colon cancer 07/24/10    rectal ca, inv adenocarcinoma  . Hx of radiation therapy 09/02/10 to 10/14/10    pelvis  . Rectal cancer 01/15/2011    S/P radiation and concurrent 5-FU continuous infusion from 09/09/10- 10/10/10.  S/P proctectomy with colorectal anastomosis and diverting loop ileostomy on 11/14/10 at [facility] by Dr. Harlon Ditty. Pathology reveals a pT3b N1 with 3/20 lymph nodes.      has GERD; DIARRHEA; Rectal cancer; Weight loss, abnormal; Dehydration; Depression; DVT (deep venous thrombosis); High output ileostomy; Hx of radiation therapy; Heme positive stool; and Anemia on his problem list.     has No Known Allergies.  Mr. Ramirez does  not currently have medications on file.  Past Surgical History  Procedure Laterality Date  . Esophagogastroduodenoscopy  07/2010    RMR: schatki ring s/p dilation, small hh, SB bx benign  . Colonoscopy  07/2010    proximal rectal apple core mass 10-14cm from anal verge (adenocarcinoma), 2-3cm distal rectal carpet polyp s/p piecemeal snare polypectomy (adenoma)  . Eus  08/2010    Dr. Owens Loffler. uT3N0 circumferential, nearly obstruction rectosigmoid adenocarcinoma, distal edge 12cm from anal verge  . Ivc filter    . Colon surgery  11/14/2010    proctectomy with colorectal anastomosis and diverting loop ileostomy (temporary planned)  . Port a cath placement    . Colostomy reversal  april 2013  . Port-a-cath removal  09/24/2011    Procedure: REMOVAL PORT-A-CATH;  Surgeon: Donato Heinz, MD;  Location: [facility];  Service: General;  Laterality: N/A;  Minor Room  . Ivc filter    . Colonoscopy  04/21/2012    RMR: Friable,fibrotic appearing colorectal anastomosis producing some luminal narrowing-not felt to be critical. path: focal erosion with slight inflammation and hyperemia. SURVEILLANCE DUE DEC 2015    Denies any  double vision, fevers, chills, night sweats, nausea, vomiting, diarrhea, constipation, chest pain, heart palpitations, shortness of breath, blood in stool, black tarry stool, urinary pain, urinary burning, urinary frequency, hematuria.   PHYSICAL EXAMINATION  ECOG PERFORMANCE STATUS: 0 - Asymptomatic  Filed Vitals:   07/13/13 0913  BP: 124/77  Pulse: 78  Temp: 97.5 F (36.4 C)  Resp: 16    GENERAL:alert, healthy, no distress, well nourished, well developed, comfortable, cooperative, obese and smiling SKIN: skin color, texture, turgor are normal, no rashes or significant lesions HEAD: Normocephalic, No masses, lesions, tenderness or abnormalities EYES: normal, PERRLA, EOMI, Conjunctiva are pink and non-injected EARS: External ears normal OROPHARYNX:mucous membranes are  moist  NECK: supple, no adenopathy, thyroid normal size, non-tender, without nodularity, no stridor, non-tender, trachea midline LYMPH:  not examined BREAST:not examined LUNGS: clear to auscultation and percussion HEART: regular rate & rhythm, no murmurs, no gallops, S1 normal and S2 normal ABDOMEN:abdomen soft, non-tender, obese, normal bowel sounds, no masses or organomegaly and no hepatosplenomegaly BACK: Back symmetric, no curvature., No CVA tenderness EXTREMITIES:less then 2 second capillary refill, no joint deformities, effusion, or inflammation, no edema, no skin discoloration, no clubbing, no cyanosis  NEURO: alert & oriented x 3 with fluent speech, no focal motor/sensory deficits, gait normal   LABORATORY DATA: CBC    Component Value Date/Time   WBC 6.6 03/07/2013 0826   WBC 6.5 02/04/2011 1059   RBC 5.17 03/07/2013 0826   RBC 4.72 05/21/2012 1015   RBC 4.57 02/04/2011 1059   HGB 15.8 03/07/2013 0826   HGB 11.6* 02/04/2011 1059   HCT 45.6 03/07/2013 0826   HCT 34.7* 02/04/2011 1059   PLT 219 03/07/2013 0826   PLT 282 02/04/2011 1059   MCV 88.2 03/07/2013 0826   MCV 76.0* 02/04/2011 1059   MCH 30.6 03/07/2013 0826   MCH 25.4* 02/04/2011 1059   MCHC 34.6 03/07/2013 0826   MCHC 33.5 02/04/2011 1059   RDW 13.0 03/07/2013 0826   RDW 18.5* 02/04/2011 1059   LYMPHSABS 2.4 03/07/2013 0826   LYMPHSABS 1.1 02/04/2011 1059   MONOABS 0.5 03/07/2013 0826   MONOABS 0.5 02/04/2011 1059   EOSABS 0.2 03/07/2013 0826   EOSABS 0.1 02/04/2011 1059   BASOSABS 0.0 03/07/2013 0826   BASOSABS 0.1 02/04/2011 1059      Chemistry      Component Value Date/Time   NA 139 03/07/2013 0826   NA 136 10/30/2010 0801   K 4.4 03/07/2013 0826   K 4.0 10/30/2010 0801   CL 103 03/07/2013 0826   CL 101 10/30/2010 0801   CO2 27 03/07/2013 0826   CO2 27 10/30/2010 0801   BUN 12 03/07/2013 0826   BUN 9 10/30/2010 0801   CREATININE 0.83 03/07/2013 0826   CREATININE 0.83 02/10/2011 1037      Component Value Date/Time   CALCIUM  9.2 03/07/2013 0826   CALCIUM 8.9 10/30/2010 0801   ALKPHOS 85 03/07/2013 0826   ALKPHOS 89* 10/30/2010 0801   AST 22 03/07/2013 0826   AST 17 10/30/2010 0801   ALT 24 03/07/2013 0826   ALT 13 10/30/2010 0801   BILITOT 1.2 03/07/2013 0826   BILITOT 1.00 10/30/2010 0801     Lab Results  Component Value Date   CEA 1.4 03/07/2013      ASSESSMENT:  1. Stage III cancer of the rectum status post preoperative radiation and chemotherapy followed by definitive surgery. He then underwent adjuvant chemotherapy with FOLFOX. During the radiation therapy and continuous infusion 5-FU  from 09/09/2010 through 10/10/2010. FOLFOX was administered from October 2012 through 07/14/2011. Thus far he has no evidence for recurrent disease.  2. DVT of right leg. He is S/P Coumadin for more than 12 months. His right LE ultrasound in Jan 2014 still showed changes of thrombosis that appear chronic, this was then repeated 8 weeks later on 07/12/2012 to verify this and this too was stable. As a result, Coumadin D/C'd on 07/26/2012.  3. Stool leakage probably due to chronic radiation therapy irritation and fibrosis potentially. Dr. Sydell Axon following.  4. Impotence without hypotestosteronism  5. Degenerative arthritis of his hands due to his work  6. Grade 1 peripheral neuropathy- chemotherapy-induced. On gabapentin with improvement in symptoms.  7. Pneumovax vaccine last given in April 2012. Will be due in April 2017. 8. Episode of right sided occipital/parietal headache followed by dizziness and weakness, resolving on its own without intervention ?TIA?  Family history of TIA/strokes.  Will defer to PCP.  Patient Active Problem List   Diagnosis Date Noted  . Heme positive stool 06/16/2012  . Anemia 06/16/2012  . Hx of radiation therapy   . 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 DVT (deep venous thrombosis) 05/09/2011  . Depression 04/01/2011  . Dehydration 03/10/2011  . Rectal cancer 01/15/2011  . Weight loss, abnormal  01/15/2011  . GERD 07/11/2010  . DIARRHEA 07/11/2010    PLAN:  1. I personally reviewed and went over laboratory results with the patient.  The results are noted within this dictation. 2. Continue NCCN guidelines for surveillance 3. Labs today: CBC diff, CMET, CEA, Iron/TIBC, Ferritin 4. Labs in 6 months: CBC diff, CMET, Ferritin, CEA 5. CT abd/pelvis as scheduled 03/15/2014 6. Recommend follow-up with PCP regarding headaches followed by weakness and dizziness.  7. Return in 6 months for follow-up   THERAPY PLAN:  NCCN guidelines for surveillance for T3/T4, N0-2, M0 colon cancer are:   A. H+P every 3-6 months x 2 years and then every 6 months for a total of 5 years   B. CEA every 3 months x 2 years and then every 6 months for a total of 5 years   C. CT abd/pelvis annually for up to 5 years   D. Colonoscopy in 1 year except if no preoperative colonoscopy due to obstructing lesion, colonoscopy in 3-6 months.    1. If advanced adenoma, repeat in 1 year   2. If no advanced adenoma, repeat in 3 years, then every 5 years  E. PET scan not routinely recommended.   All questions were answered. The patient knows to call the clinic with any problems, questions or concerns. We can certainly see the patient much sooner if necessary.  Patient and plan discussed with Dr. Farrel Gobble and he is in agreement with the aforementioned.   I spent 25 minutes counseling the patient face to face. The total time spent in the appointment was 30 minutes.  KEFALAS,THOMAS

## 2013-07-13 ENCOUNTER — Encounter (HOSPITAL_COMMUNITY): Payer: Medicare Other | Attending: Oncology | Admitting: Oncology

## 2013-07-13 ENCOUNTER — Encounter (HOSPITAL_BASED_OUTPATIENT_CLINIC_OR_DEPARTMENT_OTHER): Payer: Medicare Other

## 2013-07-13 ENCOUNTER — Encounter (HOSPITAL_COMMUNITY): Payer: Self-pay | Admitting: Oncology

## 2013-07-13 VITALS — BP 124/77 | HR 78 | Temp 97.5°F | Resp 16 | Wt 231.8 lb

## 2013-07-13 DIAGNOSIS — I10 Essential (primary) hypertension: Secondary | ICD-10-CM | POA: Insufficient documentation

## 2013-07-13 DIAGNOSIS — Z86718 Personal history of other venous thrombosis and embolism: Secondary | ICD-10-CM

## 2013-07-13 DIAGNOSIS — Z09 Encounter for follow-up examination after completed treatment for conditions other than malignant neoplasm: Secondary | ICD-10-CM | POA: Insufficient documentation

## 2013-07-13 DIAGNOSIS — Z932 Ileostomy status: Secondary | ICD-10-CM | POA: Insufficient documentation

## 2013-07-13 DIAGNOSIS — D649 Anemia, unspecified: Secondary | ICD-10-CM | POA: Insufficient documentation

## 2013-07-13 DIAGNOSIS — M19049 Primary osteoarthritis, unspecified hand: Secondary | ICD-10-CM | POA: Insufficient documentation

## 2013-07-13 DIAGNOSIS — R634 Abnormal weight loss: Secondary | ICD-10-CM | POA: Insufficient documentation

## 2013-07-13 DIAGNOSIS — Z86711 Personal history of pulmonary embolism: Secondary | ICD-10-CM | POA: Insufficient documentation

## 2013-07-13 DIAGNOSIS — R195 Other fecal abnormalities: Secondary | ICD-10-CM

## 2013-07-13 DIAGNOSIS — F329 Major depressive disorder, single episode, unspecified: Secondary | ICD-10-CM | POA: Insufficient documentation

## 2013-07-13 DIAGNOSIS — K219 Gastro-esophageal reflux disease without esophagitis: Secondary | ICD-10-CM | POA: Insufficient documentation

## 2013-07-13 DIAGNOSIS — C20 Malignant neoplasm of rectum: Secondary | ICD-10-CM

## 2013-07-13 DIAGNOSIS — T451X5A Adverse effect of antineoplastic and immunosuppressive drugs, initial encounter: Secondary | ICD-10-CM | POA: Insufficient documentation

## 2013-07-13 DIAGNOSIS — Z9221 Personal history of antineoplastic chemotherapy: Secondary | ICD-10-CM | POA: Insufficient documentation

## 2013-07-13 DIAGNOSIS — Z85038 Personal history of other malignant neoplasm of large intestine: Secondary | ICD-10-CM | POA: Insufficient documentation

## 2013-07-13 DIAGNOSIS — F3289 Other specified depressive episodes: Secondary | ICD-10-CM | POA: Insufficient documentation

## 2013-07-13 DIAGNOSIS — C189 Malignant neoplasm of colon, unspecified: Secondary | ICD-10-CM

## 2013-07-13 DIAGNOSIS — G620 Drug-induced polyneuropathy: Secondary | ICD-10-CM

## 2013-07-13 DIAGNOSIS — Z923 Personal history of irradiation: Secondary | ICD-10-CM | POA: Insufficient documentation

## 2013-07-13 DIAGNOSIS — Z9049 Acquired absence of other specified parts of digestive tract: Secondary | ICD-10-CM | POA: Insufficient documentation

## 2013-07-13 DIAGNOSIS — N529 Male erectile dysfunction, unspecified: Secondary | ICD-10-CM | POA: Insufficient documentation

## 2013-07-13 LAB — CBC WITH DIFFERENTIAL/PLATELET
Basophils Absolute: 0 10*3/uL (ref 0.0–0.1)
Basophils Relative: 1 % (ref 0–1)
Eosinophils Absolute: 0.2 10*3/uL (ref 0.0–0.7)
Eosinophils Relative: 3 % (ref 0–5)
HCT: 45.7 % (ref 39.0–52.0)
Hemoglobin: 15.8 g/dL (ref 13.0–17.0)
Lymphocytes Relative: 37 % (ref 12–46)
Lymphs Abs: 2.6 10*3/uL (ref 0.7–4.0)
MCH: 30.2 pg (ref 26.0–34.0)
MCHC: 34.6 g/dL (ref 30.0–36.0)
MCV: 87.2 fL (ref 78.0–100.0)
Monocytes Absolute: 0.6 10*3/uL (ref 0.1–1.0)
Monocytes Relative: 8 % (ref 3–12)
Neutro Abs: 3.6 10*3/uL (ref 1.7–7.7)
Neutrophils Relative %: 52 % (ref 43–77)
Platelets: 243 10*3/uL (ref 150–400)
RBC: 5.24 MIL/uL (ref 4.22–5.81)
RDW: 12.9 % (ref 11.5–15.5)
WBC: 6.9 10*3/uL (ref 4.0–10.5)

## 2013-07-13 LAB — COMPREHENSIVE METABOLIC PANEL
ALT: 22 U/L (ref 0–53)
AST: 18 U/L (ref 0–37)
Albumin: 3.6 g/dL (ref 3.5–5.2)
Alkaline Phosphatase: 91 U/L (ref 39–117)
BUN: 13 mg/dL (ref 6–23)
CO2: 28 mEq/L (ref 19–32)
Calcium: 9.3 mg/dL (ref 8.4–10.5)
Chloride: 106 mEq/L (ref 96–112)
Creatinine, Ser: 0.87 mg/dL (ref 0.50–1.35)
GFR calc Af Amer: >90 mL/min (ref >90)
GFR calc non Af Amer: >90 mL/min (ref >90)
Glucose, Bld: 100 mg/dL — ABNORMAL HIGH (ref 70–99)
Potassium: 4.5 mEq/L (ref 3.7–5.3)
Sodium: 143 mEq/L (ref 137–147)
Total Bilirubin: 0.9 mg/dL (ref 0.3–1.2)
Total Protein: 6.9 g/dL (ref 6.0–8.3)

## 2013-07-13 LAB — FERRITIN: Ferritin: 25 ng/mL (ref 22–322)

## 2013-07-13 LAB — IRON AND TIBC
Iron: 93 ug/dL (ref 42–135)
Saturation Ratios: 24 % (ref 20–55)
TIBC: 385 ug/dL (ref 215–435)
UIBC: 292 ug/dL (ref 125–400)

## 2013-07-13 NOTE — Patient Instructions (Signed)
[facility] Discharge Instructions  RECOMMENDATIONS MADE BY THE CONSULTANT AND ANY TEST RESULTS WILL BE SENT TO YOUR REFERRING PHYSICIAN.  EXAM FINDINGS BY THE PHYSICIAN TODAY AND SIGNS OR SYMPTOMS TO REPORT TO CLINIC OR PRIMARY PHYSICIAN: Exam and findings as discussed by Robynn Pane, PA - C.  Will check some labs today and if there are any issues we will call you.  Report changes in bowel habits, blood in your bowel movement, etc.  MEDICATIONS PRESCRIBED:  none  INSTRUCTIONS/FOLLOW-UP: Follow-up with lab work and office visit in 6 months and CT scans of abdomen and pelvis in November as scheduled.   Thank you for choosing [facility] to provide your oncology and hematology care.  To afford each patient quality time with our providers, please arrive at least 15 minutes before your scheduled appointment time.  With your help, our goal is to use those 15 minutes to complete the necessary work-up to ensure our physicians have the information they need to help with your evaluation and healthcare recommendations.    Effective January 1st, 2014, we ask that you re-schedule your appointment with our physicians should you arrive 10 or more minutes late for your appointment.  We strive to give you quality time with our providers, and arriving late affects you and other patients whose appointments are after yours.    Again, thank you for choosing [facility]  Our hope is that these requests will decrease the amount of time that you wait before being seen by our physicians.       _____________________________________________________________  Should you have questions after your visit to [facility], please contact our office at [phone] between the hours of 8:30 a.m. and 5:00 p.m.  Voicemails left after 4:30 p.m. will not be returned until the following business day.  For prescription refill requests, have your pharmacy contact our  office with your prescription refill request.

## 2013-07-13 NOTE — Progress Notes (Signed)
Labs drawn today for cbc/diff,cmp,Iron and IBC,ferr,cea

## 2013-07-15 LAB — CEA: CEA: 1.3 ng/mL (ref 0.0–5.0)

## 2013-09-13 ENCOUNTER — Other Ambulatory Visit (HOSPITAL_COMMUNITY): Payer: Medicare Other

## 2013-10-11 ENCOUNTER — Telehealth: Payer: Self-pay | Admitting: *Deleted

## 2013-10-11 MED ORDER — HYDROCORTISONE 2.5 % RE CREA: 1.0000 "application " | TOPICAL_CREAM | Freq: Two times a day (BID) | RECTAL | Status: DC

## 2013-10-11 NOTE — Telephone Encounter (Signed)
I sent in anusol cream. I really feel he needs an urgent visit in the next week or so to evaluate this, specifically with his history of cancer. Next surveillance colonoscopy was due Dec 2015, but we will need to set him up for early interval colonoscopy at that appt.

## 2013-10-11 NOTE — Telephone Encounter (Signed)
Spoke with pts wife- pt has been having problems with constipation/diarrhea ever since he had his colostomy reversed. He was taking metamucil everyday and still got constipated about 2 weeks ago. He took some miralax and had diarrhea, now he is seeing blood when he wipes. He seems to have some drainage coming from his rectum at times and has used everything otc that they can find and nothing seems to be helping. He was last seen in 07/2012 and she is aware that he needs to keep his appt but they are wanting to know if there is anything he can try to help him until his ov. Pt has finished his radiation/chemo treatments and is no longer on coumadin.

## 2013-10-11 NOTE — Telephone Encounter (Signed)
Pt's wife called stating pt has been having rectal bleeding for the last couple weeks, it is only when pt goes to the bathroom having a BM, pt's bowels were hard then pt took some miralax and it made his bowels soft then pt starting having rectal bleeding. Per wife pt has HX of rectal cancer. I made pt a appointment for 11/01/13 with Laban Emperor at 1:30. Wife is aware of this appointment. Please advise wife she would like to know what pt can do until his appointment, wife would like for pt to be seen sooner and to call her if we have any cancellations. [phone].

## 2013-10-11 NOTE — Telephone Encounter (Signed)
Tried to call pt- NA- LMOM with details.  Charles [name], if you have something available will you move up pt appt.

## 2013-10-12 NOTE — Telephone Encounter (Signed)
i will add him to the call back list

## 2013-11-01 ENCOUNTER — Ambulatory Visit (INDEPENDENT_AMBULATORY_CARE_PROVIDER_SITE_OTHER): Payer: Medicare Other | Admitting: Gastroenterology

## 2013-11-01 ENCOUNTER — Encounter: Payer: Self-pay | Admitting: Gastroenterology

## 2013-11-01 ENCOUNTER — Encounter (INDEPENDENT_AMBULATORY_CARE_PROVIDER_SITE_OTHER): Payer: Self-pay

## 2013-11-01 VITALS — BP 132/86 | HR 98 | Temp 97.2°F | Resp 20 | Ht 74.0 in | Wt 230.0 lb

## 2013-11-01 DIAGNOSIS — C20 Malignant neoplasm of rectum: Secondary | ICD-10-CM

## 2013-11-01 MED ORDER — HYDROCORTISONE 2.5 % RE CREA: 1.0000 "application " | TOPICAL_CREAM | Freq: Two times a day (BID) | RECTAL | Status: DC

## 2013-11-01 MED ORDER — CLOTRIMAZOLE-BETAMETHASONE 1-0.05 % EX CREA: 1.0000 "application " | TOPICAL_CREAM | Freq: Two times a day (BID) | CUTANEOUS | Status: DC

## 2013-11-01 MED ORDER — PEG 3350-KCL-NA BICARB-NACL 420 G PO SOLR: 4000.0000 mL | ORAL | Status: DC

## 2013-11-01 NOTE — Progress Notes (Signed)
Referring Provider: [name], MD Primary Care Physician:  [name], MD Primary GI: Dr. [name]   Chief Complaint  Patient presents with  . Blister    on buttock area  . Numbness    Buttock area    HPI:   Charles [name] presents today with concerns regarding rectal bleeding. He was seen in March 2014 by myself. He has a history of colorectal cancer, stage III, status post pre-operative radiation and chemo followed by surgery. His first surveillance colonoscopy since surgery was Dec 2013 by Dr. [name]. This showed a friable, fribotic appearing anastomosis. No evidence of cancer. He is due for surveillance again in Dec 2015. March 2014 he was heme positive in the setting of Coumadin, evidence of IDA. HOWEVER, he declined any further work-up such as an EGD, wanting to wait until he was off Coumadin. He was supposed to return in 6 months to be seen but did not show for this. NO LONGER ON ANTICOAGULATION.  Stool is like peanut butter with wiping. Gets on his "cheeks". States bleeding externally from "little slices" around the rectum. So sticky it sticks to the paper towel. Has fecal seepage. Will put a paper towel between his buttocks and will have a "coffee-stain" drainage on paper towel. Has blisters on rectum. Present for at least 6 months. States Proctozone with lidocaine does the best. Clotrimazole does well externally.Takes Metamucil once per day; twice per day "bound me up". Feels like he has sawdust/sand in his stool that gets between his cheeks with wiping. Tomatoes "burn" him up. Fried food worsens. No abdominal pain. Good appetite.     Past Medical History  Diagnosis Date  . HTN (hypertension)   . Pulmonary embolism   . GERD (gastroesophageal reflux disease)   . Depression 04/01/2011  . DVT (deep venous thrombosis) 05/09/2011  . 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 Colon cancer 07/24/10    rectal ca, inv adenocarcinoma  . Hx of radiation therapy 09/02/10 to 10/14/10   pelvis  . Rectal cancer 01/15/2011    S/P radiation and concurrent 5-FU continuous infusion from 09/09/10- 10/10/10.  S/P proctectomy with colorectal anastomosis and diverting loop ileostomy on 11/14/10 at [facility] by Dr. Harlon Ditty. Pathology reveals a pT3b N1 with 3/20 lymph nodes.      Past Surgical History  Procedure Laterality Date  . Esophagogastroduodenoscopy  07/2010    RMR: schatki ring s/p dilation, small hh, SB bx benign  . Colonoscopy  07/2010    proximal rectal apple core mass 10-14cm from anal verge (adenocarcinoma), 2-3cm distal rectal carpet polyp s/p piecemeal snare polypectomy (adenoma)  . Eus  08/2010    Dr. Owens Loffler. uT3N0 circumferential, nearly obstruction rectosigmoid adenocarcinoma, distal edge 12cm from anal verge  . Ivc filter    . Colon surgery  11/14/2010    proctectomy with colorectal anastomosis and diverting loop ileostomy (temporary planned)  . Port a cath placement    . Colostomy reversal  april 2013  . Port-a-cath removal  09/24/2011    Procedure: REMOVAL PORT-A-CATH;  Surgeon: Donato Heinz, MD;  Location: [facility];  Service: General;  Laterality: N/A;  Minor Room  . Ivc filter    . Colonoscopy  04/21/2012    RMR: Friable,fibrotic appearing colorectal anastomosis producing some luminal narrowing-not felt to be critical. path: focal erosion with slight inflammation and hyperemia. SURVEILLANCE DUE DEC 2015    Current Outpatient Prescriptions  Medication Sig Dispense Refill  . ALPHA LIPOIC ACID PO  Take 200 mg by mouth 2 (two) times daily.      Marland Kitchen ALPRAZolam (XANAX) 0.5 MG tablet Take 0.5 mg by mouth 2 (two) times daily.       . diphenhydramine-acetaminophen (TYLENOL PM) 25-500 MG TABS Take 1 tablet by mouth at bedtime as needed.      . gabapentin (NEURONTIN) 300 MG capsule Take 3 in AM, 2 in afternoon, and 3 at bedtime.  720 capsule  2  . ibuprofen (ADVIL,MOTRIN) 200 MG tablet Take 400 mg by mouth at bedtime as needed for pain.      Marland Kitchen omeprazole (PRILOSEC  OTC) 20 MG tablet Take 20 mg by mouth daily.      . Psyllium (METAMUCIL PO) Take by mouth 2 (two) times daily. Takes 2 tablespoons in the morning and 1 tablespoon in the evening      . vitamin B-12 (CYANOCOBALAMIN) 1000 MCG tablet Take 1,000 mcg by mouth daily.      . vitamin E 400 UNIT capsule Take 400 Units by mouth daily.      . clotrimazole-betamethasone (LOTRISONE) cream Apply 1 application topically 2 (two) times daily.  30 g  0  . hydrocortisone (PROCTOZONE-HC) 2.5 % rectal cream Place 1 application rectally 2 (two) times daily.  30 g  0   No current facility-administered medications for this visit.   Facility-Administered Medications Ordered in Other Visits  Medication Dose Route Frequency Provider Last Rate Last Dose  . sodium chloride 0.9 % injection 10 mL  10 mL Intravenous PRN Pieter Partridge, MD   10 mL at 08/22/11 1114    Allergies as of 11/01/2013  . (No Known Allergies)    Family History  Problem Relation Age of Onset  . Colon cancer Neg Hx   . Liver disease Neg Hx   . Inflammatory bowel disease Neg Hx   . Cancer Brother     throat  . Cancer Brother     prostate    History   Social History  . Marital Status: Married    Spouse Name: N/A    Number of Children: 2  . Years of Education: N/A   Occupational History  . self-employed Regulatory affairs officer, currently not working    Social History Main Topics  . Smoking status: Never Smoker   . Smokeless tobacco: Never Used  . Alcohol Use: No  . Drug Use: No  . Sexual Activity: None   Other Topics Concern  . None   Social History Narrative  . None    Review of Systems: As mentioned in HPI.   Physical Exam: BP 132/86  Pulse 98  Temp(Src) 97.2 F (36.2 C) (Oral)  Resp 20  Ht 6\' 2"  (1.88 m)  Wt 230 lb (104.327 kg)  BMI 29.52 kg/m2 General:   Alert and oriented. No distress noted. Pleasant and cooperative.  Head:  Normocephalic and atraumatic. Eyes:  Conjuctiva clear without scleral  icterus. Mouth:  Oral mucosa pink and moist. Good dentition. No lesions. Heart:  S1, S2 present without murmurs, rubs, or gallops. Regular rate and rhythm. Abdomen:  +BS, soft, non-tender and non-distended. No rebound or guarding. No HSM or masses noted. Rectal: mild erythema extending circumferentially about 3-4 cm in diameter around perianal area. No external hemorrhoids or obvious anal fissure. Internal exam with mild discomfort, query prostatomegaly (patient to see Dr. Woody Seller upcoming).  Msk:  Symmetrical without gross deformities. Normal posture. Extremities:  Without edema. Neurologic:  Alert and  oriented x4;  grossly normal neurologically.  Skin:  Intact without significant lesions or rashes. Psych:  Alert and cooperative. Normal mood and affect.  Lab Results  Component Value Date   WBC 6.9 07/13/2013   HGB 15.8 07/13/2013   HCT 45.7 07/13/2013   MCV 87.2 07/13/2013   PLT 243 07/13/2013   Lab Results  Component Value Date   IRON 93 07/13/2013   TIBC 385 07/13/2013   FERRITIN 25 07/13/2013

## 2013-11-01 NOTE — Patient Instructions (Signed)
I have sent the two creams to [facility].  We have scheduled you for a colonoscopy with Dr. [name] in the near future.

## 2013-11-02 NOTE — Assessment & Plan Note (Signed)
63 year old male with diagnosis in 2012, s/p radiation/chemo/surgical intervention, with last surveillance colonoscopy in 2013 noting a friable, fribotic-appearing anastomosis. No evidence of recurrence. Now with change in bowel habits to include pasty stool, fecal seepage, and perianal discomfort from fecal seepage. Improvement noted with proctozone/lidocaine and lotrisone for area around rectum. Although surveillance colonoscopy due in December of this year, change in bowel habits and low-volume hematochezia as mentioned above deserves a more expedited evaluation.  Proceed with TCS with Dr. [name] in near future: the risks, benefits, and alternatives have been discussed with the patient in detail. The patient states understanding and desires to proceed. Refilled Proctozone and Lotrisone Continue supplemental fiber daily.

## 2013-11-02 NOTE — Progress Notes (Signed)
cc'd to pcp 

## 2013-11-21 ENCOUNTER — Encounter (HOSPITAL_COMMUNITY): Payer: Self-pay | Admitting: Pharmacy Technician

## 2013-11-24 ENCOUNTER — Encounter (HOSPITAL_COMMUNITY)
Admission: RE | Disposition: A | Discharge status: Home / Self Care | Payer: Self-pay | Source: Ambulatory Visit | Attending: Internal Medicine

## 2013-11-24 ENCOUNTER — Encounter (HOSPITAL_COMMUNITY): Payer: Self-pay | Admitting: *Deleted

## 2013-11-24 ENCOUNTER — Ambulatory Visit (HOSPITAL_COMMUNITY)
Admission: RE | Admit: 2013-11-24 | Discharge: 2013-11-24 | Disposition: A | Discharge status: Home / Self Care | Payer: Medicare Other | Source: Ambulatory Visit | Attending: Internal Medicine | Admitting: Internal Medicine

## 2013-11-24 DIAGNOSIS — K621 Rectal polyp: Secondary | ICD-10-CM | POA: Diagnosis not present

## 2013-11-24 DIAGNOSIS — K620 Anal polyp: Secondary | ICD-10-CM | POA: Insufficient documentation

## 2013-11-24 DIAGNOSIS — Z85048 Personal history of other malignant neoplasm of rectum, rectosigmoid junction, and anus: Secondary | ICD-10-CM | POA: Diagnosis not present

## 2013-11-24 DIAGNOSIS — Z79899 Other long term (current) drug therapy: Secondary | ICD-10-CM | POA: Diagnosis not present

## 2013-11-24 DIAGNOSIS — Z85038 Personal history of other malignant neoplasm of large intestine: Secondary | ICD-10-CM | POA: Insufficient documentation

## 2013-11-24 DIAGNOSIS — Z9049 Acquired absence of other specified parts of digestive tract: Secondary | ICD-10-CM | POA: Insufficient documentation

## 2013-11-24 DIAGNOSIS — K219 Gastro-esophageal reflux disease without esophagitis: Secondary | ICD-10-CM | POA: Insufficient documentation

## 2013-11-24 DIAGNOSIS — K625 Hemorrhage of anus and rectum: Secondary | ICD-10-CM | POA: Diagnosis present

## 2013-11-24 DIAGNOSIS — K5730 Diverticulosis of large intestine without perforation or abscess without bleeding: Secondary | ICD-10-CM | POA: Insufficient documentation

## 2013-11-24 DIAGNOSIS — F329 Major depressive disorder, single episode, unspecified: Secondary | ICD-10-CM | POA: Diagnosis not present

## 2013-11-24 DIAGNOSIS — Z86711 Personal history of pulmonary embolism: Secondary | ICD-10-CM | POA: Diagnosis not present

## 2013-11-24 DIAGNOSIS — F3289 Other specified depressive episodes: Secondary | ICD-10-CM | POA: Diagnosis not present

## 2013-11-24 DIAGNOSIS — I10 Essential (primary) hypertension: Secondary | ICD-10-CM | POA: Insufficient documentation

## 2013-11-24 DIAGNOSIS — C20 Malignant neoplasm of rectum: Secondary | ICD-10-CM

## 2013-11-24 HISTORY — PX: COLONOSCOPY: SHX5424

## 2013-11-24 SURGERY — COLONOSCOPY
Anesthesia: Moderate Sedation

## 2013-11-24 MED ORDER — SODIUM CHLORIDE 0.9 % IV SOLN
INTRAVENOUS | Status: DC
  Administered 2013-11-24: 07:00:00 via INTRAVENOUS

## 2013-11-24 MED ORDER — ONDANSETRON HCL 4 MG/2ML IJ SOLN
INTRAMUSCULAR | Status: DC | PRN
  Administered 2013-11-24: 4 mg via INTRAVENOUS

## 2013-11-24 MED ORDER — MIDAZOLAM HCL 5 MG/5ML IJ SOLN
INTRAMUSCULAR | Status: AC
  Filled 2013-11-24: qty 10

## 2013-11-24 MED ORDER — STERILE WATER FOR IRRIGATION IR SOLN
Status: DC | PRN
  Administered 2013-11-24: 08:00:00

## 2013-11-24 MED ORDER — MIDAZOLAM HCL 5 MG/5ML IJ SOLN
INTRAMUSCULAR | Status: DC | PRN
  Administered 2013-11-24 (×3): 1 mg via INTRAVENOUS
  Administered 2013-11-24 (×2): 2 mg via INTRAVENOUS
  Administered 2013-11-24 (×2): 1 mg via INTRAVENOUS

## 2013-11-24 MED ORDER — ONDANSETRON HCL 4 MG/2ML IJ SOLN
INTRAMUSCULAR | Status: AC
  Filled 2013-11-24: qty 2

## 2013-11-24 MED ORDER — MEPERIDINE HCL 100 MG/ML IJ SOLN
INTRAMUSCULAR | Status: DC | PRN
  Administered 2013-11-24: 25 mg via INTRAVENOUS
  Administered 2013-11-24: 50 mg via INTRAVENOUS
  Administered 2013-11-24 (×2): 25 mg via INTRAVENOUS

## 2013-11-24 MED ORDER — MEPERIDINE HCL 100 MG/ML IJ SOLN
INTRAMUSCULAR | Status: AC
  Filled 2013-11-24: qty 2

## 2013-11-24 NOTE — Op Note (Signed)
[facility] [address] [place], [id number]   COLONOSCOPY PROCEDURE REPORT  PATIENT: Jesstin, [name]  MR#:         [id number] BIRTHDATE: 09/01/1950 , 26  yrs. old GENDER: Male ENDOSCOPIST: R.  Garfield Cornea, MD FACP FACG REFERRED BY:  Jerene Bears, M.D.  Robynn Pane, P.A. PROCEDURE DATE:  11/24/2013 PROCEDURE:     Colonoscopy with biopsy  INDICATIONS: history of rectal cancer status post low anterior resection. Scant rectal bleeding  INFORMED CONSENT:  The risks, benefits, alternatives and imponderables including but not limited to bleeding, perforation as well as the possibility of a missed lesion have been reviewed.  The potential for biopsy, lesion removal, etc. have also been discussed.  Questions have been answered.  All parties agreeable. Please see the history and physical in the medical record for more information.  MEDICATIONS: Versed 9 mg IV and Demerol 125 mg IV in divided doses. Zofran 4 mg IV.  DESCRIPTION OF PROCEDURE:  After a digital rectal exam was performed, the EC-3890Li ([id number])  colonoscope was advanced from the anus through the rectum and colon to the area of the cecum, ileocecal valve and appendiceal orifice.  The cecum was deeply intubated.  These structures were well-seen and photographed for the record.  From the level of the cecum and ileocecal valve, the scope was slowly and cautiously withdrawn.  The mucosal surfaces were carefully surveyed utilizing scope tip deflection to facilitate fold flattening as needed.  The scope was pulled down into the rectum where a thorough examination was performed.    FINDINGS:  Adequate preparation. Surgical anastomosis identified at 5 cm. Lumen somewhat narrative this point but not critically so. Overlying mucosa fibrotic no evidence of any neoplastic process. Residual rectal vault small unable to retroflex but residual rectal mucosa seen well. The colonic mucosa away the cecum was well  seen. A single ascending colon diverticulum present. The residual colon mucosa otherwise appeared entirely normal.  THERAPEUTIC / DIAGNOSTIC MANEUVERS PERFORMED:  Biopsies abnormal anastomotic mucosa taken for histologic study  COMPLICATIONS: none  CECAL WITHDRAWAL TIME:  10 minutes  IMPRESSION:   somewhat fibrotic/friable anastomotic mucosal-status post biopsy (narrowing not felt to be clinically significant). Single colonic diverticulum.  RECOMMENDATIONS: Followup on pathology. Course of Canasa suppositories one per rectum at bedtime times one month.   _______________________________ eSigned:  R. Garfield Cornea, MD FACP [facility] 11/24/2013 8:58 AM   CC:    PATIENT NAME:  Charles, [name] MR#: [id number]

## 2013-11-24 NOTE — H&P (View-Only) (Signed)
Referring Provider: [name], MD Primary Care Physician:  [name], MD Primary GI: Dr. [name]   Chief Complaint  Patient presents with  . Blister    on buttock area  . Numbness    Buttock area    HPI:   Charles [name] presents today with concerns regarding rectal bleeding. He was seen in March 2014 by myself. He has a history of colorectal cancer, stage III, status post pre-operative radiation and chemo followed by surgery. His first surveillance colonoscopy since surgery was Dec 2013 by Dr. [name]. This showed a friable, fribotic appearing anastomosis. No evidence of cancer. He is due for surveillance again in Dec 2015. March 2014 he was heme positive in the setting of Coumadin, evidence of IDA. HOWEVER, he declined any further work-up such as an EGD, wanting to wait until he was off Coumadin. He was supposed to return in 6 months to be seen but did not show for this. NO LONGER ON ANTICOAGULATION.  Stool is like peanut butter with wiping. Gets on his "cheeks". States bleeding externally from "little slices" around the rectum. So sticky it sticks to the paper towel. Has fecal seepage. Will put a paper towel between his buttocks and will have a "coffee-stain" drainage on paper towel. Has blisters on rectum. Present for at least 6 months. States Proctozone with lidocaine does the best. Clotrimazole does well externally.Takes Metamucil once per day; twice per day "bound me up". Feels like he has sawdust/sand in his stool that gets between his cheeks with wiping. Tomatoes "burn" him up. Fried food worsens. No abdominal pain. Good appetite.     Past Medical History  Diagnosis Date  . HTN (hypertension)   . Pulmonary embolism   . GERD (gastroesophageal reflux disease)   . Depression 04/01/2011  . DVT (deep venous thrombosis) 05/09/2011  . 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 Colon cancer 07/24/10    rectal ca, inv adenocarcinoma  . Hx of radiation therapy 09/02/10 to 10/14/10   pelvis  . Rectal cancer 01/15/2011    S/P radiation and concurrent 5-FU continuous infusion from 09/09/10- 10/10/10.  S/P proctectomy with colorectal anastomosis and diverting loop ileostomy on 11/14/10 at [facility] by Dr. Harlon Ditty. Pathology reveals a pT3b N1 with 3/20 lymph nodes.      Past Surgical History  Procedure Laterality Date  . Esophagogastroduodenoscopy  07/2010    RMR: schatki ring s/p dilation, small hh, SB bx benign  . Colonoscopy  07/2010    proximal rectal apple core mass 10-14cm from anal verge (adenocarcinoma), 2-3cm distal rectal carpet polyp s/p piecemeal snare polypectomy (adenoma)  . Eus  08/2010    Dr. Owens Loffler. uT3N0 circumferential, nearly obstruction rectosigmoid adenocarcinoma, distal edge 12cm from anal verge  . Ivc filter    . Colon surgery  11/14/2010    proctectomy with colorectal anastomosis and diverting loop ileostomy (temporary planned)  . Port a cath placement    . Colostomy reversal  april 2013  . Port-a-cath removal  09/24/2011    Procedure: REMOVAL PORT-A-CATH;  Surgeon: Donato Heinz, MD;  Location: [facility];  Service: General;  Laterality: N/A;  Minor Room  . Ivc filter    . Colonoscopy  04/21/2012    RMR: Friable,fibrotic appearing colorectal anastomosis producing some luminal narrowing-not felt to be critical. path: focal erosion with slight inflammation and hyperemia. SURVEILLANCE DUE DEC 2015    Current Outpatient Prescriptions  Medication Sig Dispense Refill  . ALPHA LIPOIC ACID PO  Take 200 mg by mouth 2 (two) times daily.      Marland Kitchen ALPRAZolam (XANAX) 0.5 MG tablet Take 0.5 mg by mouth 2 (two) times daily.       . diphenhydramine-acetaminophen (TYLENOL PM) 25-500 MG TABS Take 1 tablet by mouth at bedtime as needed.      . gabapentin (NEURONTIN) 300 MG capsule Take 3 in AM, 2 in afternoon, and 3 at bedtime.  720 capsule  2  . ibuprofen (ADVIL,MOTRIN) 200 MG tablet Take 400 mg by mouth at bedtime as needed for pain.      Marland Kitchen omeprazole (PRILOSEC  OTC) 20 MG tablet Take 20 mg by mouth daily.      . Psyllium (METAMUCIL PO) Take by mouth 2 (two) times daily. Takes 2 tablespoons in the morning and 1 tablespoon in the evening      . vitamin B-12 (CYANOCOBALAMIN) 1000 MCG tablet Take 1,000 mcg by mouth daily.      . vitamin E 400 UNIT capsule Take 400 Units by mouth daily.      . clotrimazole-betamethasone (LOTRISONE) cream Apply 1 application topically 2 (two) times daily.  30 g  0  . hydrocortisone (PROCTOZONE-HC) 2.5 % rectal cream Place 1 application rectally 2 (two) times daily.  30 g  0   No current facility-administered medications for this visit.   Facility-Administered Medications Ordered in Other Visits  Medication Dose Route Frequency Provider Last Rate Last Dose  . sodium chloride 0.9 % injection 10 mL  10 mL Intravenous PRN Pieter Partridge, MD   10 mL at 08/22/11 1114    Allergies as of 11/01/2013  . (No Known Allergies)    Family History  Problem Relation Age of Onset  . Colon cancer Neg Hx   . Liver disease Neg Hx   . Inflammatory bowel disease Neg Hx   . Cancer Brother     throat  . Cancer Brother     prostate    History   Social History  . Marital Status: Married    Spouse Name: N/A    Number of Children: 2  . Years of Education: N/A   Occupational History  . self-employed Regulatory affairs officer, currently not working    Social History Main Topics  . Smoking status: Never Smoker   . Smokeless tobacco: Never Used  . Alcohol Use: No  . Drug Use: No  . Sexual Activity: None   Other Topics Concern  . None   Social History Narrative  . None    Review of Systems: As mentioned in HPI.   Physical Exam: BP 132/86  Pulse 98  Temp(Src) 97.2 F (36.2 C) (Oral)  Resp 20  Ht 6\' 2"  (1.88 m)  Wt 230 lb (104.327 kg)  BMI 29.52 kg/m2 General:   Alert and oriented. No distress noted. Pleasant and cooperative.  Head:  Normocephalic and atraumatic. Eyes:  Conjuctiva clear without scleral  icterus. Mouth:  Oral mucosa pink and moist. Good dentition. No lesions. Heart:  S1, S2 present without murmurs, rubs, or gallops. Regular rate and rhythm. Abdomen:  +BS, soft, non-tender and non-distended. No rebound or guarding. No HSM or masses noted. Rectal: mild erythema extending circumferentially about 3-4 cm in diameter around perianal area. No external hemorrhoids or obvious anal fissure. Internal exam with mild discomfort, query prostatomegaly (patient to see Dr. Woody Seller upcoming).  Msk:  Symmetrical without gross deformities. Normal posture. Extremities:  Without edema. Neurologic:  Alert and  oriented x4;  grossly normal neurologically.  Skin:  Intact without significant lesions or rashes. Psych:  Alert and cooperative. Normal mood and affect.  Lab Results  Component Value Date   WBC 6.9 07/13/2013   HGB 15.8 07/13/2013   HCT 45.7 07/13/2013   MCV 87.2 07/13/2013   PLT 243 07/13/2013   Lab Results  Component Value Date   IRON 93 07/13/2013   TIBC 385 07/13/2013   FERRITIN 25 07/13/2013

## 2013-11-24 NOTE — Discharge Instructions (Signed)
°  Colonoscopy Discharge Instructions  Read the instructions outlined below and refer to this sheet in the next few weeks. These discharge instructions provide you with general information on caring for yourself after you leave the hospital. Your doctor may also give you specific instructions. While your treatment has been planned according to the most current medical practices available, unavoidable complications occasionally occur. If you have any problems or questions after discharge, call Dr. [name] at [phone]. ACTIVITY  You may resume your regular activity, but move at a slower pace for the next 24 hours.   Take frequent rest periods for the next 24 hours.   Walking will help get rid of the air and reduce the bloated feeling in your belly (abdomen).   No driving for 24 hours (because of the medicine (anesthesia) used during the test).    Do not sign any important legal documents or operate any machinery for 24 hours (because of the anesthesia used during the test).  NUTRITION  Drink plenty of fluids.   You may resume your normal diet as instructed by your doctor.   Begin with a light meal and progress to your normal diet. Heavy or fried foods are harder to digest and may make you feel sick to your stomach (nauseated).   Avoid alcoholic beverages for 24 hours or as instructed.  MEDICATIONS  You may resume your normal medications unless your doctor tells you otherwise.  WHAT YOU CAN EXPECT TODAY  Some feelings of bloating in the abdomen.   Passage of more gas than usual.   Spotting of blood in your stool or on the toilet paper.  IF YOU HAD POLYPS REMOVED DURING THE COLONOSCOPY:  No aspirin products for 7 days or as instructed.   No alcohol for 7 days or as instructed.   Eat a soft diet for the next 24 hours.  FINDING OUT THE RESULTS OF YOUR TEST Not all test results are available during your visit. If your test results are not back during the visit, make an appointment  with your caregiver to find out the results. Do not assume everything is normal if you have not heard from your caregiver or the medical facility. It is important for you to follow up on all of your test results.  SEEK IMMEDIATE MEDICAL ATTENTION IF:  You have more than a spotting of blood in your stool.   Your belly is swollen (abdominal distention).   You are nauseated or vomiting.   You have a temperature over 101.   You have abdominal pain or discomfort that is severe or gets worse throughout the day.      One-month course of Canasa suppositories one per rectum at bedtime  Further recommendations to follow pending review of pathology report

## 2013-11-24 NOTE — Interval H&P Note (Signed)
History and Physical Interval Note:  11/24/2013 8:08 AM  Charles [name]  has presented today for surgery, with the diagnosis of RECTAL CANCER  The various methods of treatment have been discussed with the patient and family. After consideration of risks, benefits and other options for treatment, the patient has consented to  Procedure(s) with comments: COLONOSCOPY (N/A) - 8:00 as a surgical intervention .  The patient's history has been reviewed, patient examined, no change in status, stable for surgery.  I have reviewed the patient's chart and labs.  Questions were answered to the patient's satisfaction.     Charles [name]  No change. Colonoscopy per plan.  The risks, benefits, limitations, alternatives and imponderables have been reviewed with the patient. Questions have been answered. All parties are agreeable.

## 2013-11-25 ENCOUNTER — Encounter (HOSPITAL_COMMUNITY): Payer: Self-pay | Admitting: Internal Medicine

## 2013-11-27 ENCOUNTER — Encounter: Payer: Self-pay | Admitting: Internal Medicine

## 2013-12-05 ENCOUNTER — Encounter: Payer: Self-pay | Admitting: Internal Medicine

## 2014-01-09 NOTE — Progress Notes (Signed)
Charles [name]., MD [place] [id number]  Rectal cancer - Plan: CBC with Differential, Comprehensive metabolic panel, CEA, Iron and TIBC, Ferritin, Vitamin B12  CURRENT THERAPY: Surveillance per NCCN guidelines  INTERVAL HISTORY: Charles [name] 63 y.o. male returns for  regular  visit for followup of stage III cancer of the rectum status post preoperative radiation and chemotherapy followed by definitive surgery. He then underwent adjuvant chemotherapy with FOLFOX. During the radiation therapy and continuous infusion 5-FU from 09/09/2010 through 10/10/2010. FOLFOX was administered from October 2012 through 07/14/2011. Thus far he has no evidence for recurrent disease.     Rectal cancer   07/24/2010 Initial Diagnosis Invasive adenocarcinoma of rectum   09/09/2010 Concurrent Chemotherapy S/P radiation and concurrent 5-FU continuous infusion from 09/09/10- 10/10/10.   11/14/2010 Surgery S/P proctectomy with colorectal anastomosis and diverting loop ileostomy on 11/14/10 at [facility] by Dr. Harlon Ditty. Pathology reveals a pT3b N1 with 3/20 lymph nodes.   02/05/2011 - 07/14/2011 Chemotherapy FOLFOX   08/18/2011 Surgery Approximate date of surgery- [place] by Dr. Harlon Ditty    Remission    11/24/2013 Survivorship Colonoscopy- somewhat fibrotic/friable anastomotic mucosal-status post biopsy (narrowing not felt to be clinically significant).  Negative pathology for malignancy    Charles [name] underwent screening colonoscopy on 11/24/2013 by Dr. Sydell Axon and his impression is as follows: somewhat fibrotic/friable anastomotic mucosal-status post biopsy (narrowing not felt to be clinically significant). Single colonic diverticulum.  I personally reviewed and went over pathology results with the patient.  Pathology from colonoscopy is negative for malignancy.  I personally reviewed and went over laboratory results with the patient.  The results are noted within this dictation.  He is having some  issues with the soles of his feet.  He reports that over the past 3 months, he has been having cyclical episodes of dry, cracking soles of feet that are tender.  This is not chemotherapy or oncology related.  He reports that he had an infection of his large toe recently and that was treated with an antibiotic.  He denies walking barefoot or burning the soles of feet.  Otherwise, oncologically, he denies any complaints and ROS questioning is negative.  Past Medical History  Diagnosis Date  . HTN (hypertension)   . Pulmonary embolism   . GERD (gastroesophageal reflux disease)   . Depression 04/01/2011  . DVT (deep venous thrombosis) 05/09/2011  . 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 Colon cancer 07/24/10    rectal ca, inv adenocarcinoma  . Hx of radiation therapy 09/02/10 to 10/14/10    pelvis  . Rectal cancer 01/15/2011    S/P radiation and concurrent 5-FU continuous infusion from 09/09/10- 10/10/10.  S/P proctectomy with colorectal anastomosis and diverting loop ileostomy on 11/14/10 at [facility] by Dr. Harlon Ditty. Pathology reveals a pT3b N1 with 3/20 lymph nodes.      has GERD; Rectal cancer; DVT (deep venous thrombosis); and Hx of radiation therapy on his problem list.     has No Known Allergies.  Charles [name] does not currently have medications on file.  Past Surgical History  Procedure Laterality Date  . Esophagogastroduodenoscopy  07/2010    RMR: schatki ring s/p dilation, small hh, SB bx benign  . Colonoscopy  07/2010    proximal rectal apple core mass 10-14cm from anal verge (adenocarcinoma), 2-3cm distal rectal carpet polyp s/p piecemeal snare polypectomy (adenoma)  . Eus  08/2010    Dr. Owens Loffler. uT3N0 circumferential, nearly obstruction rectosigmoid  adenocarcinoma, distal edge 12cm from anal verge  . Ivc filter    . Colon surgery  11/14/2010    proctectomy with colorectal anastomosis and diverting loop ileostomy (temporary planned)  . Port a cath placement    . Colostomy  reversal  april 2013  . Port-a-cath removal  09/24/2011    Procedure: REMOVAL PORT-A-CATH;  Surgeon: Donato Heinz, MD;  Location: [facility];  Service: General;  Laterality: N/A;  Minor Room  . Ivc filter    . Colonoscopy  04/21/2012    RMR: Friable,fibrotic appearing colorectal anastomosis producing some luminal narrowing-not felt to be critical. path: focal erosion with slight inflammation and hyperemia. SURVEILLANCE DUE DEC 2015  . Colonoscopy N/A 11/24/2013    Procedure: COLONOSCOPY;  Surgeon: Daneil Dolin, MD;  Location: [facility];  Service: Endoscopy;  Laterality: N/A;  8:00    Denies any headaches, dizziness, double vision, fevers, chills, night sweats, nausea, vomiting, diarrhea, constipation, chest pain, heart palpitations, shortness of breath, blood in stool, black tarry stool, urinary pain, urinary burning, urinary frequency, hematuria.   PHYSICAL EXAMINATION  ECOG PERFORMANCE STATUS: 1 - Symptomatic but completely ambulatory  Filed Vitals:   01/11/14 0931  BP: 135/78  Pulse: 74  Temp: 97.4 F (36.3 C)  Resp: 16    GENERAL:alert, no distress, well nourished, well developed, comfortable, cooperative, obese and smiling SKIN: skin color, texture, turgor are normal, no rashes or significant lesions HEAD: Normocephalic, No masses, lesions, tenderness or abnormalities EYES: normal, PERRLA, EOMI, Conjunctiva are pink and non-injected EARS: External ears normal OROPHARYNX:lips, buccal mucosa, and tongue normal and mucous membranes are moist  NECK: supple, no adenopathy, thyroid normal size, non-tender, without nodularity, no stridor, non-tender, trachea midline LYMPH:  no palpable lymphadenopathy, no hepatosplenomegaly BREAST:not examined LUNGS: clear to auscultation and percussion HEART: regular rate & rhythm, no murmurs, no gallops, S1 normal and S2 normal ABDOMEN:abdomen soft, non-tender, obese, normal bowel sounds, no masses or organomegaly and no  hepatosplenomegaly BACK: Back symmetric, no curvature., No CVA tenderness EXTREMITIES:less then 2 second capillary refill, no joint deformities, effusion, or inflammation, no edema, no clubbing, Bilateral soles of feet are erythematous with cracking of skin and are tender with weight bearing.  NEURO: alert & oriented x 3 with fluent speech, no focal motor/sensory deficits   LABORATORY DATA: CBC    Component Value Date/Time   WBC 6.8 01/11/2014 0909   WBC 6.5 02/04/2011 1059   RBC 5.09 01/11/2014 0909   RBC 4.72 05/21/2012 1015   RBC 4.57 02/04/2011 1059   HGB 15.3 01/11/2014 0909   HGB 11.6* 02/04/2011 1059   HCT 44.2 01/11/2014 0909   HCT 34.7* 02/04/2011 1059   PLT 200 01/11/2014 0909   PLT 282 02/04/2011 1059   MCV 86.8 01/11/2014 0909   MCV 76.0* 02/04/2011 1059   MCH 30.1 01/11/2014 0909   MCH 25.4* 02/04/2011 1059   MCHC 34.6 01/11/2014 0909   MCHC 33.5 02/04/2011 1059   RDW 13.5 01/11/2014 0909   RDW 18.5* 02/04/2011 1059   LYMPHSABS 2.1 01/11/2014 0909   LYMPHSABS 1.1 02/04/2011 1059   MONOABS 0.4 01/11/2014 0909   MONOABS 0.5 02/04/2011 1059   EOSABS 0.2 01/11/2014 0909   EOSABS 0.1 02/04/2011 1059   BASOSABS 0.0 01/11/2014 0909   BASOSABS 0.1 02/04/2011 1059      Chemistry      Component Value Date/Time   NA 141 01/11/2014 0909   NA 136 10/30/2010 0801   K 4.5 01/11/2014 1324  K 4.0 10/30/2010 0801   CL 105 01/11/2014 0909   CL 101 10/30/2010 0801   CO2 25 01/11/2014 0909   CO2 27 10/30/2010 0801   BUN 21 01/11/2014 0909   BUN 9 10/30/2010 0801   CREATININE 0.81 01/11/2014 0909   CREATININE 0.83 02/10/2011 1037      Component Value Date/Time   CALCIUM 9.1 01/11/2014 0909   CALCIUM 8.9 10/30/2010 0801   ALKPHOS 96 01/11/2014 0909   ALKPHOS 89* 10/30/2010 0801   AST 17 01/11/2014 0909   AST 17 10/30/2010 0801   ALT 20 01/11/2014 0909   ALT 13 10/30/2010 0801   BILITOT 0.9 01/11/2014 0909   BILITOT 1.00 10/30/2010 0801     Lab Results  Component Value Date   IRON 93 07/13/2013   TIBC 385 07/13/2013   FERRITIN  25 07/13/2013   Lab Results  Component Value Date   CEA 1.3 07/13/2013    PATHOLOGY:  11/24/2013  Diagnosis Rectum, biopsy, anastomosis - BENIGN POLYPOID COLORECTAL MUCOSA. - THERE IS NO EVIDENCE OF MALIGNANCY. Enid Cutter MD Pathologist, Electronic Signature (Case signed 11/25/2013)    ASSESSMENT:  1. Stage III cancer of the rectum status post preoperative radiation and chemotherapy followed by definitive surgery. He then underwent adjuvant chemotherapy with FOLFOX. During the radiation therapy and continuous infusion 5-FU from 09/09/2010 through 10/10/2010. FOLFOX was administered from October 2012 through 07/14/2011. Thus far he has no evidence for recurrent disease.  2. DVT of right leg. He is S/P Coumadin for more than 12 months. His right LE ultrasound in Jan 2014 still showed changes of thrombosis that appear chronic, this was then repeated 8 weeks later on 07/12/2012 to verify this and this too was stable. As a result, Coumadin D/C'd on 07/26/2012.  3. Stool leakage probably due to chronic radiation therapy irritation and fibrosis potentially. Dr. Sydell Axon following.  4. Impotence without hypotestosteronism  5. Degenerative arthritis of his hands due to his work  6. Grade 1 peripheral neuropathy- chemotherapy-induced. On gabapentin with improvement in symptoms.  7. Pneumovax vaccine last given in April 2012. Will be due in April 2017.  8. Bilateral erythematous soles of feet with cracking and pain  Patient Active Problem List   Diagnosis Date Noted  . Hx of radiation therapy   . DVT (deep venous thrombosis) 05/09/2011  . Rectal cancer 01/15/2011  . GERD 07/11/2010     PLAN:  1. I personally reviewed and went over laboratory results with the patient.  The results are noted within this dictation. 2. I personally reviewed and went over radiographic studies with the patient.  The results are noted within this dictation.   3. I personally reviewed and went over pathology results  with the patient. 4. Continue NCCN guidelines for surveillance 5. Labs in 6 months: CBC diff, CMET, Ferritin, CEA 6. CT abd/pelvis as scheduled 03/15/2014 7. Follow-up with primary care provider regarding redness and pain of soles of feet. 8. Return in 6 months for follow-up.   THERAPY PLAN:  NCCN guidelines for surveillance for T3/T4, N0-2, M0 colorectal cancer are:   A. H+P every 3-6 months x 2 years and then every 6 months for a total of 5 years   B. CEA every 3 months x 2 years and then every 6 months for a total of 5 years   C. CT abd/pelvis annually for up to 5 years   D. Colonoscopy in 1 year except if no preoperative colonoscopy due to obstructing lesion, colonoscopy in 3-6  months.    1. If advanced adenoma, repeat in 1 year   2. If no advanced adenoma, repeat in 3 years, then every 5 years  E. PET scan not routinely recommended.   All questions were answered. The patient knows to call the clinic with any problems, questions or concerns. We can certainly see the patient much sooner if necessary.  Patient and plan discussed with Dr. Farrel Gobble and he is in agreement with the aforementioned.   Akhilesh Sassone 01/11/2014

## 2014-01-11 ENCOUNTER — Encounter (HOSPITAL_COMMUNITY): Payer: Self-pay | Admitting: Oncology

## 2014-01-11 ENCOUNTER — Encounter (HOSPITAL_COMMUNITY): Payer: Medicare Other | Attending: Oncology | Admitting: Oncology

## 2014-01-11 ENCOUNTER — Encounter (HOSPITAL_BASED_OUTPATIENT_CLINIC_OR_DEPARTMENT_OTHER): Payer: Medicare Other

## 2014-01-11 VITALS — BP 135/78 | HR 74 | Temp 97.4°F | Resp 16 | Wt 229.6 lb

## 2014-01-11 DIAGNOSIS — M19049 Primary osteoarthritis, unspecified hand: Secondary | ICD-10-CM | POA: Diagnosis not present

## 2014-01-11 DIAGNOSIS — L539 Erythematous condition, unspecified: Secondary | ICD-10-CM | POA: Insufficient documentation

## 2014-01-11 DIAGNOSIS — R195 Other fecal abnormalities: Secondary | ICD-10-CM

## 2014-01-11 DIAGNOSIS — R159 Full incontinence of feces: Secondary | ICD-10-CM

## 2014-01-11 DIAGNOSIS — T451X5A Adverse effect of antineoplastic and immunosuppressive drugs, initial encounter: Secondary | ICD-10-CM | POA: Insufficient documentation

## 2014-01-11 DIAGNOSIS — Z86718 Personal history of other venous thrombosis and embolism: Secondary | ICD-10-CM | POA: Insufficient documentation

## 2014-01-11 DIAGNOSIS — R151 Fecal smearing: Secondary | ICD-10-CM | POA: Insufficient documentation

## 2014-01-11 DIAGNOSIS — I82509 Chronic embolism and thrombosis of unspecified deep veins of unspecified lower extremity: Secondary | ICD-10-CM

## 2014-01-11 DIAGNOSIS — G620 Drug-induced polyneuropathy: Secondary | ICD-10-CM | POA: Diagnosis not present

## 2014-01-11 DIAGNOSIS — C20 Malignant neoplasm of rectum: Secondary | ICD-10-CM

## 2014-01-11 DIAGNOSIS — Z85048 Personal history of other malignant neoplasm of rectum, rectosigmoid junction, and anus: Secondary | ICD-10-CM

## 2014-01-11 DIAGNOSIS — N529 Male erectile dysfunction, unspecified: Secondary | ICD-10-CM | POA: Diagnosis not present

## 2014-01-11 DIAGNOSIS — Z09 Encounter for follow-up examination after completed treatment for conditions other than malignant neoplasm: Secondary | ICD-10-CM | POA: Diagnosis not present

## 2014-01-11 LAB — CBC WITH DIFFERENTIAL/PLATELET
Basophils Absolute: 0 K/uL (ref 0.0–0.1)
Basophils Relative: 0 % (ref 0–1)
Eosinophils Absolute: 0.2 K/uL (ref 0.0–0.7)
Eosinophils Relative: 3 % (ref 0–5)
HCT: 44.2 % (ref 39.0–52.0)
Hemoglobin: 15.3 g/dL (ref 13.0–17.0)
Lymphocytes Relative: 30 % (ref 12–46)
Lymphs Abs: 2.1 K/uL (ref 0.7–4.0)
MCH: 30.1 pg (ref 26.0–34.0)
MCHC: 34.6 g/dL (ref 30.0–36.0)
MCV: 86.8 fL (ref 78.0–100.0)
Monocytes Absolute: 0.4 K/uL (ref 0.1–1.0)
Monocytes Relative: 6 % (ref 3–12)
Neutro Abs: 4.1 K/uL (ref 1.7–7.7)
Neutrophils Relative %: 61 % (ref 43–77)
Platelets: 200 K/uL (ref 150–400)
RBC: 5.09 MIL/uL (ref 4.22–5.81)
RDW: 13.5 % (ref 11.5–15.5)
WBC: 6.8 K/uL (ref 4.0–10.5)

## 2014-01-11 LAB — COMPREHENSIVE METABOLIC PANEL WITH GFR
ALT: 20 U/L (ref 0–53)
AST: 17 U/L (ref 0–37)
Albumin: 3.6 g/dL (ref 3.5–5.2)
Alkaline Phosphatase: 96 U/L (ref 39–117)
Anion gap: 11 (ref 5–15)
BUN: 21 mg/dL (ref 6–23)
CO2: 25 meq/L (ref 19–32)
Calcium: 9.1 mg/dL (ref 8.4–10.5)
Chloride: 105 meq/L (ref 96–112)
Creatinine, Ser: 0.81 mg/dL (ref 0.50–1.35)
GFR calc Af Amer: >90 mL/min
GFR calc non Af Amer: >90 mL/min
Glucose, Bld: 129 mg/dL — ABNORMAL HIGH (ref 70–99)
Potassium: 4.5 meq/L (ref 3.7–5.3)
Sodium: 141 meq/L (ref 137–147)
Total Bilirubin: 0.9 mg/dL (ref 0.3–1.2)
Total Protein: 6.9 g/dL (ref 6.0–8.3)

## 2014-01-11 LAB — CEA: CEA: 1.2 ng/mL (ref 0.0–5.0)

## 2014-01-11 LAB — FERRITIN: Ferritin: 42 ng/mL (ref 22–322)

## 2014-01-11 NOTE — Addendum Note (Signed)
Addended by: Mellissa Kohut on: 01/11/2014 05:14 PM   Modules accepted: Orders

## 2014-01-11 NOTE — Progress Notes (Signed)
STAR Program Physical Impairment and Functional Assessment Screening Tool  1. Are you having any pain, including headaches, joint pain, or muscle pain (upper body = OT; lower body = PT)?  Yes, this started after my diagnosis and is still a problem.  2. Do your hands and/or feet feel numb or tingle (PT)?  Yes, this started after my diagnosis and is still a problem.  3. Does any part of your body feel swollen or larger than usual (upper body = OT; lower body = PT)?  Yes, this started after my diagnosis and is still a problem.  4. Are you so tired that you cannot do the things you want or need to do (PT or OT)?  No  5. Are you feeling weak or are you having trouble moving any part of your body (PT/OT)?  No  6. Are you having trouble concentrating, thinking, or remembering things (OT/ST)?  No  7. Are you having trouble moving around or feel like you might trip or fall (PT)?  No  8. Are you having trouble swallowing (ST)?  No  9. Are you having trouble speaking (ST)?  No  10. Are you having trouble with going or getting to the bathroom (OT)?  No  11. Are you having trouble with your sexual function (OT)?  Yes, this started after my diagnosis and is still a problem.  12. Are you having trouble lifting things, even just your arms (OT/PT)?  No  13. Are you having trouble taking care of yourself as in dressing or bathing (OT)?  No  14. Are you having trouble with daily tasks like chores or shopping (OT)?  No  15. Are you having trouble driving (OT)?  No  16. Are you having trouble returning to work or completing your tasks at work (OT)?  No  Other concerns:    Legend: OT = Occupational Therapy PT = Physical Therapy ST = Speech Therapy

## 2014-01-11 NOTE — Progress Notes (Signed)
LABS DRAWN FOR CBCD,CMP,CEA,FERR

## 2014-01-11 NOTE — Patient Instructions (Signed)
[facility] Discharge Instructions  RECOMMENDATIONS MADE BY THE CONSULTANT AND ANY TEST RESULTS WILL BE SENT TO YOUR REFERRING PHYSICIAN.  EXAM FINDINGS BY THE PHYSICIAN TODAY AND SIGNS OR SYMPTOMS TO REPORT TO CLINIC OR PRIMARY PHYSICIAN: Exam and findings as discussed by Robynn Pane, PA - C.  Keep feet well moisturized and can use antibiotic ointment on areas of cracking. Follow-up with Dr. Woody Seller. Report changes in bowel habits, blood in your bowel movements or other concerns.    INSTRUCTIONS/FOLLOW-UP: CTs in November and labs and office visit in 6 months.  Thank you for choosing [facility] to provide your oncology and hematology care.  To afford each patient quality time with our providers, please arrive at least 15 minutes before your scheduled appointment time.  With your help, our goal is to use those 15 minutes to complete the necessary work-up to ensure our physicians have the information they need to help with your evaluation and healthcare recommendations.    Effective January 1st, 2014, we ask that you re-schedule your appointment with our physicians should you arrive 10 or more minutes late for your appointment.  We strive to give you quality time with our providers, and arriving late affects you and other patients whose appointments are after yours.    Again, thank you for choosing [facility].  Our hope is that these requests will decrease the amount of time that you wait before being seen by our physicians.       _____________________________________________________________  Should you have questions after your visit to [facility], please contact our office at [phone] between the hours of 8:30 a.m. and 4:30 p.m.  Voicemails left after 4:30 p.m. will not be returned until the following business day.  For prescription refill requests, have your pharmacy contact our office with your prescription refill request.     _______________________________________________________________  We hope that we have given you very good care.  You may receive a patient satisfaction survey in the mail, please complete it and return it as soon as possible.  We value your feedback!  _______________________________________________________________  Have you asked about our STAR program?  STAR stands for Survivorship Training and Rehabilitation, and this is a nationally recognized cancer care program that focuses on survivorship and rehabilitation.  Cancer and cancer treatments may cause problems, such as, pain, making you feel tired and keeping you from doing the things that you need or want to do. Cancer rehabilitation can help. Our goal is to reduce these troubling effects and help you have the best quality of life possible.  You may receive a survey from a nurse that asks questions about your current state of health.  Based on the survey results, all eligible patients will be referred to the [facility] program for an evaluation so we can better serve you!  A frequently asked questions sheet is available upon request.

## 2014-01-30 ENCOUNTER — Ambulatory Visit (HOSPITAL_COMMUNITY)
Admission: RE | Admit: 2014-01-30 | Discharge: 2014-01-30 | Disposition: A | Discharge status: Home / Self Care | Payer: Medicare Other | Source: Ambulatory Visit | Attending: Hematology and Oncology | Admitting: Hematology and Oncology

## 2014-01-30 DIAGNOSIS — M25676 Stiffness of unspecified foot, not elsewhere classified: Secondary | ICD-10-CM | POA: Insufficient documentation

## 2014-01-30 DIAGNOSIS — G5790 Unspecified mononeuropathy of unspecified lower limb: Secondary | ICD-10-CM | POA: Diagnosis not present

## 2014-01-30 DIAGNOSIS — R209 Unspecified disturbances of skin sensation: Secondary | ICD-10-CM | POA: Diagnosis not present

## 2014-01-30 DIAGNOSIS — R29898 Other symptoms and signs involving the musculoskeletal system: Secondary | ICD-10-CM | POA: Insufficient documentation

## 2014-01-30 DIAGNOSIS — M25673 Stiffness of unspecified ankle, not elsewhere classified: Secondary | ICD-10-CM | POA: Insufficient documentation

## 2014-01-30 DIAGNOSIS — IMO0001 Reserved for inherently not codable concepts without codable children: Secondary | ICD-10-CM | POA: Insufficient documentation

## 2014-01-30 NOTE — Evaluation (Addendum)
Physical Therapy Evaluation  Patient Details  Name: Charles [name] MRN: [id number] Date of Birth: Mar 21, 1951  Today's Date: 01/30/2014 Time: 1525-1600 PT Time Calculation (min): 35 min Charge:  Evaluation              Visit#: 1 of 8  Re-eval: 03/02/14 Assessment Diagnosis: B peripheral neuropathy  Next MD Visit: 06/2014  Authorization: [employer] medicare    Authorization Time Period:    Authorization Visit#: 1 of 8   Past Medical History:  Past Medical History  Diagnosis Date  . HTN (hypertension)   . Pulmonary embolism   . GERD (gastroesophageal reflux disease)   . Depression 04/01/2011  . DVT (deep venous thrombosis) 05/09/2011  . 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 Colon cancer 07/24/10    rectal ca, inv adenocarcinoma  . Hx of radiation therapy 09/02/10 to 10/14/10    pelvis  . Rectal cancer 01/15/2011    S/P radiation and concurrent 5-FU continuous infusion from 09/09/10- 10/10/10.  S/P proctectomy with colorectal anastomosis and diverting loop ileostomy on 11/14/10 at [facility] by Dr. Harlon Ditty. Pathology reveals a pT3b N1 with 3/20 lymph nodes.     Past Surgical History:  Past Surgical History  Procedure Laterality Date  . Esophagogastroduodenoscopy  07/2010    RMR: schatki ring s/p dilation, small hh, SB bx benign  . Colonoscopy  07/2010    proximal rectal apple core mass 10-14cm from anal verge (adenocarcinoma), 2-3cm distal rectal carpet polyp s/p piecemeal snare polypectomy (adenoma)  . Eus  08/2010    Dr. Owens Loffler. uT3N0 circumferential, nearly obstruction rectosigmoid adenocarcinoma, distal edge 12cm from anal verge  . Ivc filter    . Colon surgery  11/14/2010    proctectomy with colorectal anastomosis and diverting loop ileostomy (temporary planned)  . Port a cath placement    . Colostomy reversal  april 2013  . Port-a-cath removal  09/24/2011    Procedure: REMOVAL PORT-A-CATH;  Surgeon: Donato Heinz, MD;  Location: [facility];  Service: General;  Laterality: N/A;   Minor Room  . Ivc filter    . Colonoscopy  04/21/2012    RMR: Friable,fibrotic appearing colorectal anastomosis producing some luminal narrowing-not felt to be critical. path: focal erosion with slight inflammation and hyperemia. SURVEILLANCE DUE DEC 2015  . Colonoscopy N/A 11/24/2013    Procedure: COLONOSCOPY;  Surgeon: Daneil Dolin, MD;  Location: [facility];  Service: Endoscopy;  Laterality: N/A;  8:00    Subjective Symptoms/Limitations Symptoms: Mr. Forget states that he started having numbness and tingling in B feet  since he had chemo therapy in March of 2013.  He is as active as he normally is he is just having trouble when he first starts to walk,   How long can you stand comfortably?: a few minutes  How long can you walk comfortably?: not stopping he is able to walk as long as he wants but to shop where he has to stop he needs to sit down after five to ten minutes  Special Tests: Pt states that he has not difficulty with balance  Patient Stated Goals: Tingling keeps him up at night due to the feeling of pins and needles.  He wakes up 3-4 times a night.  Pain Assessment Currently in Pain?:  (more tingling )    Balance Screening Balance Screen Has the patient fallen in the past 6 months: No    Sensation/Coordination/Flexibility/Functional Tests Functional Tests Functional Tests: FACIT-F 143/160; VAS fatigue 3/10; VAS pain 7.3;  VAS distress 1  Assessment RLE AROM (degrees) Right Ankle Dorsiflexion: 5 Right Ankle Plantar Flexion: 35 RLE Strength Right Hip Flexion: 5/5 Right Hip Extension: 2+/5 Right Hip ABduction: 5/5 Right Knee Flexion: 5/5 Right Knee Extension: 5/5 Right Ankle Dorsiflexion: 4/5 Right Ankle Plantar Flexion: 2/5 LLE AROM (degrees) Left Ankle Dorsiflexion: 5 Left Ankle Plantar Flexion: 35 Left Ankle Inversion: 25 Left Ankle Eversion: 10 LLE Strength Left Hip Flexion: 5/5 Left Hip Extension: 2-/5 Left Hip ABduction: 5/5 Left Knee Flexion:  4/5 Left Knee Extension: 5/5 Left Ankle Dorsiflexion: 4/5 Left Ankle Plantar Flexion: 4/5  Exercise/Treatments Mobility/Balance  Static Standing Balance Single Leg Stance - Right Leg: 39 Single Leg Stance - Left Leg: 31    Ankle Exercises - Seated Ankle Circles/Pumps: 5 reps Towel Inversion/Eversion: 5 reps Ankle Exercises - Supine T-Band: all x 5  Other Supine Ankle Exercises: bridge x 10       Physical Therapy Assessment and Plan PT Assessment and Plan Clinical Impression Statement: Pt is a 63 yo male who has complaints of numbness, pain and tingling in his ankles following chemo.  Pt exam reveals decreased glut strength B, decreased ankle strength B, ankle stiffness and pain.  Pt will benefit from skilled therapy to improve the above mentioned deficits to improve his pain and functional abiity.  Pt will benefit from skilled therapeutic intervention in order to improve on the following deficits: Decreased balance;Pain;Decreased range of motion Rehab Potential: Good PT Frequency: Min 1X/week PT Duration: 8 weeks PT Treatment/Interventions: Therapeutic activities;Therapeutic exercise;Balance training;Patient/family education;Manual techniques PT Plan: Pt to begin gastroc/ soleus stretch, plantarfascia stretch (Pt will need pictures of these stretches to have at home as he is only coming in once a week), balance including SLS, vector stance, rocker board, baps.  Continue to progress into yoga steps, sit to stand to promote glut strength.     Goals Home Exercise Program Pt/caregiver will Perform Home Exercise Program: For increased strengthening;For increased ROM PT Short Term Goals Time to Complete Short Term Goals: 2 weeks PT Short Term Goal 1: Pain in LE to be no greater than a 5/10 PT Short Term Goal 2: Pt to be able to stand for 5-10 minutes in comfort to be ablet to complete self grooming activities.  PT Long Term Goals Time to Complete Long Term Goals: 4 weeks PT Long  Term Goal 1: Pt pain to be no greater than a 3/10 80% of the day. PT Long Term Goal 2: Pt to be able to walk with ease after sitting for 15 minutes without difficulty  Long Term Goal 3: Pt to be able to stand for 15-20 minutes to be able to make a small meal.  Long Term Goal 4: Strength of gluts and ankle mm to be improved by one grade   Problem List Patient Active Problem List   Diagnosis Date Noted  . Stiffness of joint, not elsewhere classified, ankle and foot 01/31/2014  . Weakness of both legs 01/31/2014  . Hx of radiation therapy   . DVT (deep venous thrombosis) 05/09/2011  . Rectal cancer 01/15/2011  . GERD 07/11/2010    PT Plan of Care PT Home Exercise Plan: given for t-band, ROM and bridges   GP Functional Assessment Tool Used: clinical judgement. Functional Limitation: Changing and maintaining body position Changing and Maintaining Body Position Current Status [phone]): At least 60 percent but less than 80 percent impaired, limited or restricted Changing and Maintaining Body Position Goal Status (C7893): At least 40 percent but  less than 60 percent impaired, limited or restricted  RUSSELL,CINDY 01/31/2014, 9:29 AM  Physician Documentation Your signature is required to indicate approval of the treatment plan as stated above.  Please sign and either send electronically or make a copy of this report for your files and return this physician signed original.   Please mark one 1.__approve of plan  2. ___approve of plan with the following conditions.   ______________________________                                                          _____________________ Physician Signature                                                                                                             Date

## 2014-01-31 DIAGNOSIS — R29898 Other symptoms and signs involving the musculoskeletal system: Secondary | ICD-10-CM | POA: Insufficient documentation

## 2014-01-31 DIAGNOSIS — M25676 Stiffness of unspecified foot, not elsewhere classified: Secondary | ICD-10-CM

## 2014-01-31 DIAGNOSIS — M25673 Stiffness of unspecified ankle, not elsewhere classified: Secondary | ICD-10-CM | POA: Insufficient documentation

## 2014-02-06 ENCOUNTER — Telehealth (HOSPITAL_COMMUNITY): Payer: Self-pay

## 2014-02-06 NOTE — Telephone Encounter (Signed)
cx all appointments - his wife said he is just unable to take therapy

## 2014-02-07 ENCOUNTER — Ambulatory Visit (HOSPITAL_COMMUNITY): Payer: Medicare Other | Admitting: Physical Therapy

## 2014-02-17 ENCOUNTER — Ambulatory Visit (HOSPITAL_COMMUNITY): Payer: Medicare Other | Admitting: Physical Therapy

## 2014-02-24 ENCOUNTER — Ambulatory Visit (HOSPITAL_COMMUNITY): Payer: Medicare Other

## 2014-03-03 ENCOUNTER — Ambulatory Visit (HOSPITAL_COMMUNITY): Payer: Medicare Other | Admitting: Physical Therapy

## 2014-03-03 NOTE — Addendum Note (Signed)
Encounter addended by: Leeroy Cha, PT on: 03/03/2014 10:59 AM<BR>     Documentation filed: Clinical Notes

## 2014-03-10 ENCOUNTER — Ambulatory Visit (HOSPITAL_COMMUNITY): Payer: Medicare Other

## 2014-03-13 ENCOUNTER — Ambulatory Visit (INDEPENDENT_AMBULATORY_CARE_PROVIDER_SITE_OTHER): Payer: Medicare Other | Admitting: Gastroenterology

## 2014-03-13 ENCOUNTER — Encounter: Payer: Self-pay | Admitting: Gastroenterology

## 2014-03-13 VITALS — BP 148/88 | HR 90 | Temp 97.0°F | Ht 74.0 in | Wt 233.0 lb

## 2014-03-13 DIAGNOSIS — C20 Malignant neoplasm of rectum: Secondary | ICD-10-CM

## 2014-03-13 NOTE — Progress Notes (Signed)
cc'ed to pcp °

## 2014-03-13 NOTE — Progress Notes (Signed)
Referring Provider: [name], MD Primary Care Physician:  [name], MD  Primary GI: Dr. [name]   Chief Complaint  Patient presents with  . Follow-up    HPI:   63 year old male with rectal cancer diagnosis in 2012, s/p radiation/chemo/surgical intervention, with last surveillance colonoscopy performed July 2015 noting a friable/ fribotic-appearing anastomosis s/p biopsy with benign path.  No evidence of recurrence. Clinically improved. No further fecal seepage. Just wipes one time and "that's it". Stool solid now. Metamucil daily. No abdominal pain. Had episode of diarrhea with associated low-volume hematochezia. Used rectal cream with resolution. Excessive wiping causes irritation. Good appetite.   Past Medical History  Diagnosis Date  . HTN (hypertension)   . Pulmonary embolism   . GERD (gastroesophageal reflux disease)   . Depression 04/01/2011  . DVT (deep venous thrombosis) 05/09/2011  . 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 Colon cancer 07/24/10    rectal ca, inv adenocarcinoma  . Hx of radiation therapy 09/02/10 to 10/14/10    pelvis  . Rectal cancer 01/15/2011    S/P radiation and concurrent 5-FU continuous infusion from 09/09/10- 10/10/10.  S/P proctectomy with colorectal anastomosis and diverting loop ileostomy on 11/14/10 at [facility] by Dr. Harlon Ditty. Pathology reveals a pT3b N1 with 3/20 lymph nodes.      Past Surgical History  Procedure Laterality Date  . Esophagogastroduodenoscopy  07/2010    RMR: schatki ring s/p dilation, small hh, SB bx benign  . Colonoscopy  07/2010    proximal rectal apple core mass 10-14cm from anal verge (adenocarcinoma), 2-3cm distal rectal carpet polyp s/p piecemeal snare polypectomy (adenoma)  . Eus  08/2010    Dr. Owens Loffler. uT3N0 circumferential, nearly obstruction rectosigmoid adenocarcinoma, distal edge 12cm from anal verge  . Ivc filter    . Colon surgery  11/14/2010    proctectomy with colorectal anastomosis and diverting  loop ileostomy (temporary planned)  . Port a cath placement    . Colostomy reversal  april 2013  . Port-a-cath removal  09/24/2011    Procedure: REMOVAL PORT-A-CATH;  Surgeon: Donato Heinz, MD;  Location: [facility];  Service: General;  Laterality: N/A;  Minor Room  . Ivc filter    . Colonoscopy  04/21/2012    RMR: Friable,fibrotic appearing colorectal anastomosis producing some luminal narrowing-not felt to be critical. path: focal erosion with slight inflammation and hyperemia. SURVEILLANCE DUE DEC 2015  . Colonoscopy N/A 11/24/2013    Dr. Rourk:somewhat fibrotic/friable anastomotic mucosal-status post biopsy (narrowing not felt to be clinically significant) Single colonic diverticulum. benign polypoid rectal mucosa    Current Outpatient Prescriptions  Medication Sig Dispense Refill  . ALPHA LIPOIC ACID PO Take 200 mg by mouth 2 (two) times daily.    Marland Kitchen ALPRAZolam (XANAX) 0.5 MG tablet Take 0.5 mg by mouth 2 (two) times daily.     . clotrimazole-betamethasone (LOTRISONE) cream Apply 1 application topically 2 (two) times daily. 30 g 0  . diphenhydramine-acetaminophen (TYLENOL PM) 25-500 MG TABS Take 1 tablet by mouth at bedtime as needed (sleep).     . gabapentin (NEURONTIN) 300 MG capsule Take 3 in AM, 2 in afternoon, and 3 at bedtime. 720 capsule 2  . hydrocortisone (PROCTOZONE-HC) 2.5 % rectal cream Place 1 application rectally 2 (two) times daily. 30 g 0  . ibuprofen (ADVIL,MOTRIN) 200 MG tablet Take 400 mg by mouth at bedtime as needed for pain.    Marland Kitchen omeprazole (PRILOSEC OTC) 20 MG tablet Take 20  mg by mouth daily.    . Psyllium (METAMUCIL PO) Take by mouth 2 (two) times daily. Takes 2 tablespoons in the morning and 1 tablespoon in the evening    . triamcinolone cream (KENALOG) 0.1 % Apply 1 application topically as needed.    . vitamin B-12 (CYANOCOBALAMIN) 1000 MCG tablet Take 1,000 mcg by mouth daily.    . vitamin E 400 UNIT capsule Take 400 Units by mouth daily.     No current  facility-administered medications for this visit.   Facility-Administered Medications Ordered in Other Visits  Medication Dose Route Frequency Provider Last Rate Last Dose  . sodium chloride 0.9 % injection 10 mL  10 mL Intravenous PRN Pieter Partridge, MD   10 mL at 08/22/11 1114    Allergies as of 03/13/2014  . (No Known Allergies)    Family History  Problem Relation Age of Onset  . Colon cancer Neg Hx   . Liver disease Neg Hx   . Inflammatory bowel disease Neg Hx   . Cancer Brother     throat  . Cancer Brother     prostate    History   Social History  . Marital Status: Married    Spouse Name: N/A    Number of Children: 2  . Years of Education: N/A   Occupational History  . self-employed Regulatory affairs officer, currently not working    Social History Main Topics  . Smoking status: Never Smoker   . Smokeless tobacco: Never Used  . Alcohol Use: No  . Drug Use: No  . Sexual Activity: Not on file   Other Topics Concern  . Not on file   Social History Narrative    Review of Systems: As mentioned in HPI  Physical Exam: BP 148/88 mmHg  Pulse 90  Temp(Src) 97 F (36.1 C) (Oral)  Ht 6\' 2"  (1.88 m)  Wt 233 lb (105.688 kg)  BMI 29.90 kg/m2 General:   Alert and oriented. No distress noted. Pleasant and cooperative.  Head:  Normocephalic and atraumatic. Eyes:  Conjuctiva clear without scleral icterus. Abdomen:  +BS, soft, non-tender and non-distended. No rebound or guarding. No HSM or masses noted. Msk:  Symmetrical without gross deformities. Normal posture. Extremities:  Without edema. Neurologic:  Alert and  oriented x4;  grossly normal neurologically. Skin:  Intact without significant lesions or rashes. Psych:  Alert and cooperative. Normal mood and affect.

## 2014-03-13 NOTE — Assessment & Plan Note (Signed)
63 year old male originally diagnosed in 2012, undergoing radiation/chemo/surgical intervention, with surveillance colonoscopy up-to-date and without evidence of recurrent disease. Much improvement in bowel habits with addition of supplemental fiber daily. Will have return in 6 months with next surveillance in 2019.

## 2014-03-13 NOTE — Patient Instructions (Signed)
Continue to take Metamucil daily.   We will see you back in 6 months!

## 2014-03-15 ENCOUNTER — Encounter (HOSPITAL_COMMUNITY): Payer: Self-pay

## 2014-03-15 ENCOUNTER — Ambulatory Visit (HOSPITAL_COMMUNITY)
Admission: RE | Admit: 2014-03-15 | Discharge: 2014-03-15 | Disposition: A | Discharge status: Home / Self Care | Payer: Medicare Other | Source: Ambulatory Visit | Attending: Oncology | Admitting: Oncology

## 2014-03-15 DIAGNOSIS — Z9049 Acquired absence of other specified parts of digestive tract: Secondary | ICD-10-CM | POA: Diagnosis not present

## 2014-03-15 DIAGNOSIS — C20 Malignant neoplasm of rectum: Secondary | ICD-10-CM | POA: Insufficient documentation

## 2014-03-15 DIAGNOSIS — Z923 Personal history of irradiation: Secondary | ICD-10-CM | POA: Diagnosis not present

## 2014-03-15 DIAGNOSIS — Z9221 Personal history of antineoplastic chemotherapy: Secondary | ICD-10-CM | POA: Diagnosis not present

## 2014-03-15 DIAGNOSIS — C189 Malignant neoplasm of colon, unspecified: Secondary | ICD-10-CM

## 2014-03-15 DIAGNOSIS — Z08 Encounter for follow-up examination after completed treatment for malignant neoplasm: Secondary | ICD-10-CM | POA: Insufficient documentation

## 2014-03-15 LAB — POCT I-STAT CREATININE: Creatinine, Ser: 0.9 mg/dL (ref 0.50–1.35)

## 2014-03-15 MED ORDER — IOHEXOL 300 MG/ML  SOLN
100.0000 mL | Freq: Once | INTRAMUSCULAR | Status: AC | PRN
  Administered 2014-03-15: 100 mL via INTRAVENOUS

## 2014-03-17 ENCOUNTER — Ambulatory Visit (HOSPITAL_COMMUNITY): Payer: Medicare Other | Admitting: Physical Therapy

## 2014-03-21 NOTE — Therapy (Addendum)
Discharge Patient Details  Name: ELIC VENCILL MRN: [id number] Date of Birth: 1950/10/25  Encounter Date: 01/30/2014    PT did not return for any additional visits he was seen for an initial evaluation on 01/30/2014 we will discharge him from our care.  RUSSELL,CINDY PT 03/21/2014, 10:14 AM

## 2014-03-24 ENCOUNTER — Ambulatory Visit (HOSPITAL_COMMUNITY): Payer: Medicare Other | Admitting: Physical Therapy

## 2014-05-18 ENCOUNTER — Other Ambulatory Visit (HOSPITAL_COMMUNITY): Payer: Self-pay | Admitting: Oncology

## 2014-05-18 DIAGNOSIS — G629 Polyneuropathy, unspecified: Secondary | ICD-10-CM

## 2014-05-18 MED ORDER — GABAPENTIN 300 MG PO CAPS: ORAL_CAPSULE | ORAL | Status: DC

## 2014-07-12 ENCOUNTER — Encounter (HOSPITAL_COMMUNITY): Payer: Self-pay | Admitting: Oncology

## 2014-07-12 ENCOUNTER — Encounter (HOSPITAL_BASED_OUTPATIENT_CLINIC_OR_DEPARTMENT_OTHER): Payer: Medicare Other

## 2014-07-12 ENCOUNTER — Encounter (HOSPITAL_COMMUNITY): Payer: Medicare Other | Attending: Oncology | Admitting: Oncology

## 2014-07-12 VITALS — BP 161/76 | HR 73 | Temp 98.4°F | Resp 18 | Wt 229.3 lb

## 2014-07-12 DIAGNOSIS — G629 Polyneuropathy, unspecified: Secondary | ICD-10-CM | POA: Insufficient documentation

## 2014-07-12 DIAGNOSIS — T451X5A Adverse effect of antineoplastic and immunosuppressive drugs, initial encounter: Secondary | ICD-10-CM

## 2014-07-12 DIAGNOSIS — G620 Drug-induced polyneuropathy: Secondary | ICD-10-CM | POA: Insufficient documentation

## 2014-07-12 DIAGNOSIS — C20 Malignant neoplasm of rectum: Secondary | ICD-10-CM | POA: Diagnosis not present

## 2014-07-12 DIAGNOSIS — G622 Polyneuropathy due to other toxic agents: Secondary | ICD-10-CM

## 2014-07-12 LAB — FERRITIN: Ferritin: 40 ng/mL (ref 22–322)

## 2014-07-12 LAB — CBC WITH DIFFERENTIAL/PLATELET
Basophils Absolute: 0 10*3/uL (ref 0.0–0.1)
Basophils Relative: 0 % (ref 0–1)
Eosinophils Absolute: 0.1 10*3/uL (ref 0.0–0.7)
Eosinophils Relative: 2 % (ref 0–5)
HCT: 43.7 % (ref 39.0–52.0)
Hemoglobin: 15.1 g/dL (ref 13.0–17.0)
Lymphocytes Relative: 25 % (ref 12–46)
Lymphs Abs: 1.9 10*3/uL (ref 0.7–4.0)
MCH: 30.5 pg (ref 26.0–34.0)
MCHC: 34.6 g/dL (ref 30.0–36.0)
MCV: 88.3 fL (ref 78.0–100.0)
Monocytes Absolute: 0.5 10*3/uL (ref 0.1–1.0)
Monocytes Relative: 7 % (ref 3–12)
Neutro Abs: 4.8 10*3/uL (ref 1.7–7.7)
Neutrophils Relative %: 66 % (ref 43–77)
Platelets: 235 10*3/uL (ref 150–400)
RBC: 4.95 MIL/uL (ref 4.22–5.81)
RDW: 13.2 % (ref 11.5–15.5)
WBC: 7.3 10*3/uL (ref 4.0–10.5)

## 2014-07-12 LAB — COMPREHENSIVE METABOLIC PANEL
ALT: 17 U/L (ref 0–53)
AST: 16 U/L (ref 0–37)
Albumin: 3.9 g/dL (ref 3.5–5.2)
Alkaline Phosphatase: 78 U/L (ref 39–117)
Anion gap: 7 (ref 5–15)
BUN: 12 mg/dL (ref 6–23)
CO2: 26 mmol/L (ref 19–32)
Calcium: 9 mg/dL (ref 8.4–10.5)
Chloride: 109 mmol/L (ref 96–112)
Creatinine, Ser: 0.83 mg/dL (ref 0.50–1.35)
GFR calc Af Amer: >90 mL/min (ref >90)
GFR calc non Af Amer: >90 mL/min (ref >90)
Glucose, Bld: 140 mg/dL — ABNORMAL HIGH (ref 70–99)
Potassium: 3.9 mmol/L (ref 3.5–5.1)
Sodium: 142 mmol/L (ref 135–145)
Total Bilirubin: 1.2 mg/dL (ref 0.3–1.2)
Total Protein: 6.7 g/dL (ref 6.0–8.3)

## 2014-07-12 LAB — VITAMIN B12: Vitamin B-12: 571 pg/mL (ref 211–911)

## 2014-07-12 LAB — IRON AND TIBC
Iron: 132 ug/dL (ref 42–165)
Saturation Ratios: 37 % (ref 20–55)
TIBC: 353 ug/dL (ref 215–435)
UIBC: 221 ug/dL (ref 125–400)

## 2014-07-12 NOTE — Assessment & Plan Note (Signed)
Grade 2, stable.  He reports neuropathy of feet > hands.  He notes ambulation "feels like walking on a thick cushion."  On gabapentin.  Now with numbness of anal area.  Likely secondary to radiation therapy versus surgery, less likely malignancy recurrence.  Patient offered MRI of L-spine and he declined at this time.  He would like to follow this symptom for now and he will call us in 2-3 weeks if not better or resolved, at which time we will set-up for MRI of L-spine.

## 2014-07-12 NOTE — Progress Notes (Signed)
Labs for b12,cea,iron/tibc,ferr,cmp,cbcd

## 2014-07-12 NOTE — Assessment & Plan Note (Signed)
Stage III cancer of the rectum status post preoperative radiation and chemotherapy followed by definitive surgery. He then underwent adjuvant chemotherapy with FOLFOX. During the radiation therapy and continuous infusion 5-FU from 09/09/2010 through 10/10/2010. FOLFOX was administered from October 2012 through 07/14/2011. Thus far he has no evidence for recurrent disease. We will continue to follow NCCN guidelines regarding surveillance.  Labs in 6 months: CBC diff, CMET, CEA.  Next screening colonoscopy will be due in 2019 according to Lady Saucier, NP note (GI).  Surveillance CT imaging will be ordered at next office visit to fulfill annual imaging x 5 years per NCCN guidelines. Return in 6 months for follow-up.

## 2014-07-12 NOTE — Patient Instructions (Signed)
..  [place] at [facility] Discharge Instructions  RECOMMENDATIONS MADE BY THE CONSULTANT AND ANY TEST RESULTS WILL BE SENT TO YOUR REFERRING PHYSICIAN.  Labs in 6 months Return in 6 months Call us if you decide you want to go ahead with the MRI of your back to investigate numbness  Thank you for choosing [place] [facility] to provide your oncology and hematology care.  To afford each patient quality time with our provider, please arrive at least 15 minutes before your scheduled appointment time.    You need to re-schedule your appointment should you arrive 10 or more minutes late.  We strive to give you quality time with our providers, and arriving late affects you and other patients whose appointments are after yours.  Also, if you no show three or more times for appointments you may be dismissed from the clinic at the providers discretion.     Again, thank you for choosing [facility].  Our hope is that these requests will decrease the amount of time that you wait before being seen by our physicians.       _____________________________________________________________  Should you have questions after your visit to [facility], please contact our office at [phone] between the hours of 8:30 a.m. and 4:30 p.m.  Voicemails left after 4:30 p.m. will not be returned until the following business day.  For prescription refill requests, have your pharmacy contact our office.

## 2014-07-12 NOTE — Progress Notes (Signed)
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 male returns for followup of stage III cancer of the rectum status post preoperative radiation and chemotherapy followed by definitive surgery. He then underwent adjuvant chemotherapy with FOLFOX. During the radiation therapy and continuous infusion 5-FU from 09/09/2010 through 10/10/2010. FOLFOX was administered from October 2012 through 07/14/2011. Thus far he has no evidence for recurrent disease.     Rectal cancer   07/24/2010 Initial Diagnosis Invasive adenocarcinoma of rectum   09/09/2010 Concurrent Chemotherapy S/P radiation and concurrent 5-FU continuous infusion from 09/09/10- 10/10/10.   11/14/2010 Surgery S/P proctectomy with colorectal anastomosis and diverting loop ileostomy on 11/14/10 at [facility] by Dr. Harlon Ditty. Pathology reveals a pT3b N1 with 3/20 lymph nodes.   02/05/2011 - 07/14/2011 Chemotherapy FOLFOX   08/18/2011 Surgery Approximate date of surgery- [place] by Dr. Harlon Ditty    Remission    11/24/2013 Survivorship Colonoscopy- somewhat fibrotic/friable anastomotic mucosal-status post biopsy (narrowing not felt to be clinically significant).  Negative pathology for malignancy   I personally reviewed and went over laboratory results with the patient.  The results are noted within this dictation.  He is doing well with residual peripheral neuropathy.  He notes that it is stable and not getting worse.  He better quantifies the peripheral neuropathy today with me than he has in the past.  He reports that his feet are worse than his hands.  He notes that the peripheral neuropathy dependings of his activities for the [name].  He reports that his LE neuropathy is from mid-lower leg distally.   He notes that if he sits too long and then stands and ambulates, his feet feel like they are walking on a thick cushion.    He also notes a new complaint today.  He reports that his "hind end" is numb.  He reports that when he wipes, he does not feel that sensation.  He denies any loss in bowel or bladder control.  He denies any difficulty with ambulation, except for peripheral neuropathy.  His appetite is good.  He really does not have any other complaints associated with that.   Otherwise, oncologically, he denies any any complaints and ROS questioning is negative.  Past Medical History  Diagnosis Date  . HTN (hypertension)   . Pulmonary embolism   . GERD (gastroesophageal reflux disease)   . Depression 04/01/2011  . DVT (deep venous thrombosis) 05/09/2011  . 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 Colon cancer 07/24/10    rectal ca, inv adenocarcinoma  . Hx of radiation therapy 09/02/10 to 10/14/10    pelvis  . Rectal cancer 01/15/2011    S/P radiation and concurrent 5-FU continuous infusion from 09/09/10- 10/10/10.  S/P proctectomy with colorectal anastomosis and diverting loop ileostomy on 11/14/10 at [facility] by Dr. Harlon Ditty. Pathology reveals a pT3b N1 with 3/20 lymph nodes.      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     has No Known Allergies.  Mr. Pender had no medications administered during this visit.  Past Surgical History  Procedure Laterality Date  . Esophagogastroduodenoscopy  07/2010  RMR: schatki ring s/p dilation, small hh, SB bx benign  . Colonoscopy  07/2010    proximal rectal apple core mass 10-14cm from anal verge (adenocarcinoma), 2-3cm distal rectal carpet polyp s/p piecemeal snare polypectomy (adenoma)  . Eus  08/2010    Dr. Owens Loffler. uT3N0 circumferential, nearly obstruction  rectosigmoid adenocarcinoma, distal edge 12cm from anal verge  . Ivc filter    . Colon surgery  11/14/2010    proctectomy with colorectal anastomosis and diverting loop ileostomy (temporary planned)  . Port a cath placement    . Colostomy reversal  april 2013  . Port-a-cath removal  09/24/2011    Procedure: REMOVAL PORT-A-CATH;  Surgeon: Donato Heinz, MD;  Location: [facility];  Service: General;  Laterality: N/A;  Minor Room  . Ivc filter    . Colonoscopy  04/21/2012    RMR: Friable,fibrotic appearing colorectal anastomosis producing some luminal narrowing-not felt to be critical. path: focal erosion with slight inflammation and hyperemia. SURVEILLANCE DUE DEC 2015  . Colonoscopy N/A 11/24/2013    Dr. Rourk:somewhat fibrotic/friable anastomotic mucosal-status post biopsy (narrowing not felt to be clinically significant) Single colonic diverticulum. benign polypoid rectal mucosa    Denies any headaches, dizziness, double vision, fevers, chills, night sweats, nausea, vomiting, diarrhea, constipation, chest pain, heart palpitations, shortness of breath, blood in stool, black tarry stool, urinary pain, urinary burning, urinary frequency, hematuria.   PHYSICAL EXAMINATION  ECOG PERFORMANCE STATUS: 0 - Asymptomatic  Filed Vitals:   07/12/14 0938  BP: 161/76  Pulse: 73  Temp: 98.4 F (36.9 C)  Resp: 18    GENERAL:alert, no distress, well nourished, well developed, comfortable, cooperative, obese and smiling, unaccompanied  SKIN: skin color, texture, turgor are normal, no rashes or significant lesions HEAD: Normocephalic, No masses, lesions, tenderness or abnormalities EYES: normal, PERRLA, EOMI, Conjunctiva are pink and non-injected EARS: External ears normal OROPHARYNX:lips, buccal mucosa, and tongue normal and mucous membranes are moist  NECK: supple, no adenopathy, thyroid normal size, non-tender, without nodularity, no stridor, non-tender, trachea midline LYMPH:  no palpable  lymphadenopathy, no hepatosplenomegaly BREAST:not examined LUNGS: clear to auscultation and percussion HEART: regular rate & rhythm, no murmurs, no gallops, S1 normal and S2 normal ABDOMEN:abdomen soft, non-tender, obese, normal bowel sounds, no masses or organomegaly and no hepatosplenomegaly BACK: Back symmetric, no curvature., No CVA tenderness EXTREMITIES:less then 2 second capillary refill, no joint deformities, effusion, or inflammation, no edema, no skin discoloration, no clubbing, no cyanosis  NEURO: alert & oriented x 3 with fluent speech, no focal motor/sensory deficits, gait normal    LABORATORY DATA: CBC    Component Value Date/Time   WBC 7.3 07/12/2014 0901   WBC 6.5 02/04/2011 1059   RBC 4.95 07/12/2014 0901   RBC 4.72 05/21/2012 1015   RBC 4.57 02/04/2011 1059   HGB 15.1 07/12/2014 0901   HGB 11.6* 02/04/2011 1059   HCT 43.7 07/12/2014 0901   HCT 34.7* 02/04/2011 1059   PLT 235 07/12/2014 0901   PLT 282 02/04/2011 1059   MCV 88.3 07/12/2014 0901   MCV 76.0* 02/04/2011 1059   MCH 30.5 07/12/2014 0901   MCH 25.4* 02/04/2011 1059   MCHC 34.6 07/12/2014 0901   MCHC 33.5 02/04/2011 1059   RDW 13.2 07/12/2014 0901   RDW 18.5* 02/04/2011 1059   LYMPHSABS 1.9 07/12/2014 0901   LYMPHSABS 1.1 02/04/2011 1059   MONOABS 0.5 07/12/2014 0901   MONOABS 0.5 02/04/2011 1059   EOSABS 0.1 07/12/2014 0901   EOSABS 0.1 02/04/2011 1059  BASOSABS 0.0 07/12/2014 0901   BASOSABS 0.1 02/04/2011 1059      Chemistry      Component Value Date/Time   NA 142 07/12/2014 0901   NA 136 10/30/2010 0801   K 3.9 07/12/2014 0901   K 4.0 10/30/2010 0801   CL 109 07/12/2014 0901   CL 101 10/30/2010 0801   CO2 26 07/12/2014 0901   CO2 27 10/30/2010 0801   BUN 12 07/12/2014 0901   BUN 9 10/30/2010 0801   CREATININE 0.83 07/12/2014 0901   CREATININE 0.83 02/10/2011 1037      Component Value Date/Time   CALCIUM 9.0 07/12/2014 0901   CALCIUM 8.9 10/30/2010 0801   ALKPHOS 78  07/12/2014 0901   ALKPHOS 89* 10/30/2010 0801   AST 16 07/12/2014 0901   AST 17 10/30/2010 0801   ALT 17 07/12/2014 0901   ALT 13 10/30/2010 0801   BILITOT 1.2 07/12/2014 0901   BILITOT 1.00 10/30/2010 0801     Lab Results  Component Value Date   CEA 1.2 01/11/2014     ASSESSMENT AND PLAN:  Rectal cancer Stage III cancer of the rectum status post preoperative radiation and chemotherapy followed by definitive surgery. He then underwent adjuvant chemotherapy with FOLFOX. During the radiation therapy and continuous infusion 5-FU from 09/09/2010 through 10/10/2010. FOLFOX was administered from October 2012 through 07/14/2011. Thus far he has no evidence for recurrent disease. We will continue to follow NCCN guidelines regarding surveillance.  Labs in 6 months: CBC diff, CMET, CEA.  Next screening colonoscopy will be due in 2019 according to Lady Saucier, NP note (GI).  Surveillance CT imaging will be ordered at next office visit to fulfill annual imaging x 5 years per NCCN guidelines. Return in 6 months for follow-up.   Peripheral neuropathy due to oxaliplatin-chemotherapy Grade 2, stable.  He reports neuropathy of feet > hands.  He notes ambulation "feels like walking on a thick cushion."  On gabapentin.  Now with numbness of anal area.  Likely secondary to radiation therapy versus surgery, less likely malignancy recurrence.  Patient offered MRI of L-spine and he declined at this time.  He would like to follow this symptom for now and he will call us in 2-3 weeks if not better or resolved, at which time we will set-up for MRI of L-spine.      THERAPY PLAN: NCCN guidelines for surveillance for T3/T4, N0-2, M0 colorectal cancer are:   A. H+P every 3-6 months x 2 years and then every 6 months for a total of 5 years   B. CEA every 3 months x 2 years and then every 6 months for a total of 5 years   C. CT abd/pelvis annually for up to 5 years   D. Colonoscopy in 1 year except if no preoperative  colonoscopy due to obstructing lesion, colonoscopy in 3-6 months.    1. If advanced adenoma, repeat in 1 year   2. If no advanced adenoma, repeat in 3 years, then every 5 years  E. PET scan not routinely recommended.   All questions were answered. The patient knows to call the clinic with any problems, questions or concerns. We can certainly see the patient much sooner if necessary.  Patient and plan discussed with Dr. Ancil Linsey and she is in agreement with the aforementioned.   This note is electronically signed by: Robynn Pane 07/12/2014 10:32 AM

## 2014-07-13 LAB — CEA: CEA: 1.9 ng/mL (ref 0.0–4.7)

## 2014-07-27 ENCOUNTER — Other Ambulatory Visit: Payer: Self-pay

## 2014-07-27 ENCOUNTER — Telehealth: Payer: Self-pay | Admitting: General Practice

## 2014-07-27 ENCOUNTER — Other Ambulatory Visit: Payer: Self-pay | Admitting: Gastroenterology

## 2014-07-27 DIAGNOSIS — R197 Diarrhea, unspecified: Secondary | ICD-10-CM

## 2014-07-27 MED ORDER — HYDROCORTISONE 2.5 % RE CREA: 1.0000 "application " | TOPICAL_CREAM | Freq: Two times a day (BID) | RECTAL | Status: DC

## 2014-07-27 NOTE — Telephone Encounter (Signed)
Almyra Free, please give Mr. Roots wife a call with Anna's recommendations.

## 2014-07-27 NOTE — Telephone Encounter (Signed)
Patient's wife called and wanted to know if patient could be seen next week.  We were able to move the patient up to see Magda Paganini next week at 10:00am.  Patient's wife is aware of the new appointment.  Her husband has had a drainage from his rectum for about 3 weeks, however he just made his wife aware of this last night.  The drainage is real brown and has mucus in it.  The patient has a history of rectal cancer.  He also has a lot of burning in his rectum, diarrhea,blisters on the outside of his rectum(blisters have cleared up for now) and some mild right sided abdominal pain. He denies fever, nausea and vomiting.   Patient's wife is concerned and wanted to know if there is something they should do in the meantime, while they wait for his appointment on next week.  Routing to [employer] for advice.

## 2014-07-27 NOTE — Telephone Encounter (Signed)
If diarrhea onset is new, let's check stool studies (Cdiff PCR, stool culture, Giardia).  I am sending in proctozone for patient, as he has used thsi before.   Monitor abdominal discomfort. If worsens, call us. If any fever, seek medical attention.

## 2014-07-27 NOTE — Telephone Encounter (Signed)
pts wife is aware. Stool containers at the front desk

## 2014-07-29 LAB — CLOSTRIDIUM DIFFICILE BY PCR: Toxigenic C. Difficile by PCR: NOT DETECTED

## 2014-07-31 LAB — GIARDIA ANTIGEN: Giardia Screen (EIA): NEGATIVE

## 2014-08-01 ENCOUNTER — Encounter: Payer: Self-pay | Admitting: Gastroenterology

## 2014-08-01 ENCOUNTER — Ambulatory Visit (INDEPENDENT_AMBULATORY_CARE_PROVIDER_SITE_OTHER): Payer: Medicare Other | Admitting: Gastroenterology

## 2014-08-01 VITALS — BP 158/83 | HR 80 | Temp 97.2°F | Ht 74.0 in | Wt 226.0 lb

## 2014-08-01 DIAGNOSIS — R197 Diarrhea, unspecified: Secondary | ICD-10-CM | POA: Diagnosis not present

## 2014-08-01 DIAGNOSIS — C20 Malignant neoplasm of rectum: Secondary | ICD-10-CM | POA: Diagnosis not present

## 2014-08-01 LAB — STOOL CULTURE

## 2014-08-01 MED ORDER — MESALAMINE 1000 MG RE SUPP: 1000.0000 mg | Freq: Every day | RECTAL | Status: DC

## 2014-08-01 MED ORDER — CLOTRIMAZOLE-BETAMETHASONE 1-0.05 % EX CREA
1.0000 | TOPICAL_CREAM | Freq: Two times a day (BID) | CUTANEOUS | Status: DC
Start: 2014-08-01 — End: 2014-10-06

## 2014-08-01 NOTE — Progress Notes (Signed)
Primary Care Physician: [name], MD  Primary Gastroenterologist:  Garfield Cornea, MD   Chief Complaint  Patient presents with  . rectal cancer    HPI: Charles [name] is a 64 y.o. male here for further evaluation of diarrhea and fecal incontinence. Called him last week on an appointment. Patient with history of stage III rectal adenocarcinoma status post pre-operative radiation and chemotherapy followed by definitive surgery. Postoperative chemotherapy as well. Symptoms occurring for about 3-4 weeks. Had baseline generally has 4-5 formed bowel movements daily without incontinence. Several weeks ago bowel movements became very loose/liquidy. Stool would run down his leg without him having any control over it. Since that time he's been folding paper towel and wearing it in his underwear. When going to the bathroom he would see brown drainage on the paper towel. Perineal skin became very irritated and blistered. Used Lotrisone cream with good relief. Denies any rectal bleeding. Had some stomach cramps this morning but not previously. Denies any recent antibiotic use. No ill contacts. Yesterday only had 2 bowel movements. Stools seem to be firming up. Less "drainage" on the paper towel. No upper GI concerns today.  . Colonoscopy N/A 11/24/2013    Dr. Rourk:somewhat fibrotic/friable anastomotic mucosal-status post biopsy (narrowing not felt to be clinically significant) Single colonic diverticulum. benign polypoid rectal mucosa   CT abdomen pelvis with contrast November 2015. No evidence of recurrent or metastatic disease. Postsurgical changes related to prior distal colonic resection. Recent labs as noted below.  Current Outpatient Prescriptions  Medication Sig Dispense Refill  . ALPHA LIPOIC ACID PO Take 200 mg by mouth 2 (two) times daily.    . clotrimazole-betamethasone (LOTRISONE) cream Apply 1 application topically 2 (two) times daily. 30 g 0  . diphenhydramine-acetaminophen  (TYLENOL PM) 25-500 MG TABS Take 1 tablet by mouth at bedtime as needed (sleep).     . gabapentin (NEURONTIN) 300 MG capsule Take 3 in AM, 2 in afternoon, and 3 at bedtime. (Patient taking differently: 900 mg 2 (two) times daily. ) 720 capsule 2  . ibuprofen (ADVIL,MOTRIN) 200 MG tablet Take 400 mg by mouth at bedtime as needed for pain.    Marland Kitchen omeprazole (PRILOSEC OTC) 20 MG tablet Take 20 mg by mouth daily.    . Psyllium (METAMUCIL PO) Take by mouth 2 (two) times daily. Takes 2 tablespoons in the morning and 1 tablespoon in the evening    . vitamin B-12 (CYANOCOBALAMIN) 1000 MCG tablet Take 1,000 mcg by mouth daily.    . vitamin E 400 UNIT capsule Take 400 Units by mouth daily.     No current facility-administered medications for this visit.   Facility-Administered Medications Ordered in Other Visits  Medication Dose Route Frequency Provider Last Rate Last Dose  . sodium chloride 0.9 % injection 10 mL  10 mL Intravenous PRN Everardo All, MD   10 mL at 08/22/11 1114    Allergies as of 08/01/2014  . (No Known Allergies)   Past Medical History  Diagnosis Date  . HTN (hypertension)   . Pulmonary embolism   . GERD (gastroesophageal reflux disease)   . Depression 04/01/2011  . DVT (deep venous thrombosis) 05/09/2011  . 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 Colon cancer 07/24/10    rectal ca, inv adenocarcinoma  . Hx of radiation therapy 09/02/10 to 10/14/10    pelvis  . Rectal cancer 01/15/2011    S/P radiation and concurrent 5-FU continuous infusion from 09/09/10- 10/10/10.  S/P proctectomy with  colorectal anastomosis and diverting loop ileostomy on 11/14/10 at [facility] by Dr. Harlon Ditty. Pathology reveals a pT3b N1 with 3/20 lymph nodes.     Past Surgical History  Procedure Laterality Date  . Esophagogastroduodenoscopy  07/2010    RMR: schatki ring s/p dilation, small hh, SB bx benign  . Colonoscopy  07/2010    proximal rectal apple core mass 10-14cm from anal verge (adenocarcinoma), 2-3cm  distal rectal carpet polyp s/p piecemeal snare polypectomy (adenoma)  . Eus  08/2010    Dr. Owens Loffler. uT3N0 circumferential, nearly obstruction rectosigmoid adenocarcinoma, distal edge 12cm from anal verge  . Ivc filter    . Colon surgery  11/14/2010    proctectomy with colorectal anastomosis and diverting loop ileostomy (temporary planned)  . Port a cath placement    . Colostomy reversal  april 2013  . Port-a-cath removal  09/24/2011    Procedure: REMOVAL PORT-A-CATH;  Surgeon: Donato Heinz, MD;  Location: [facility];  Service: General;  Laterality: N/A;  Minor Room  . Ivc filter    . Colonoscopy  04/21/2012    RMR: Friable,fibrotic appearing colorectal anastomosis producing some luminal narrowing-not felt to be critical. path: focal erosion with slight inflammation and hyperemia. SURVEILLANCE DUE DEC 2015  . Colonoscopy N/A 11/24/2013    Dr. Rourk:somewhat fibrotic/friable anastomotic mucosal-status post biopsy (narrowing not felt to be clinically significant) Single colonic diverticulum. benign polypoid rectal mucosa    ROS:  General: Negative for anorexia, weight loss, fever, chills, fatigue, weakness. ENT: Negative for hoarseness, difficulty swallowing , nasal congestion. CV: Negative for chest pain, angina, palpitations, dyspnea on exertion, peripheral edema.  Respiratory: Negative for dyspnea at rest, dyspnea on exertion, cough, sputum, wheezing.  GI: See history of present illness. GU:  Negative for dysuria, hematuria, urinary incontinence, urinary frequency, nocturnal urination.  Endo: Negative for unusual weight change.    Physical Examination:   BP 158/83 mmHg  Pulse 80  Temp(Src) 97.2 F (36.2 C)  Ht 6\' 2"  (1.88 m)  Wt 226 lb (102.513 kg)  BMI 29.00 kg/m2  General: Well-nourished, well-developed in no acute distress.  Eyes: No icterus. Mouth: Oropharyngeal mucosa moist and pink , no lesions erythema or exudate. Lungs: Clear to auscultation bilaterally.  Heart:  Regular rate and rhythm, no murmurs rubs or gallops.  Abdomen: Bowel sounds are normal, nontender, nondistended, no hepatosplenomegaly or masses, no abdominal bruits or hernia , no rebound or guarding.   Rectal exam: Mild perianal erythema but no lesions or drainage. Digital rectal exam reveals good tone with squeeze, no masses in the rectal vault, nontender exam. Heme-negative Extremities: No lower extremity edema. No clubbing or deformities. Neuro: Alert and oriented x 4   Skin: Warm and dry, no jaundice.   Psych: Alert and cooperative, normal mood and affect.  Labs:  Lab Results  Component Value Date   WBC 7.3 07/12/2014   HGB 15.1 07/12/2014   HCT 43.7 07/12/2014   MCV 88.3 07/12/2014   PLT 235 07/12/2014   Lab Results  Component Value Date   CREATININE 0.83 07/12/2014   BUN 12 07/12/2014   NA 142 07/12/2014   K 3.9 07/12/2014   CL 109 07/12/2014   CO2 26 07/12/2014   Lab Results  Component Value Date   ALT 17 07/12/2014   AST 16 07/12/2014   ALKPHOS 78 07/12/2014   BILITOT 1.2 07/12/2014   Lab Results  Component Value Date   IRON 132 07/12/2014   TIBC 353 07/12/2014   FERRITIN  40 07/12/2014   Lab Results  Component Value Date   YFRTMYTR17 356 07/12/2014   No results found for: FOLATE Lab Results  Component Value Date   CEA 1.9 07/12/2014    Imaging Studies: No results found.

## 2014-08-01 NOTE — Patient Instructions (Addendum)
1. Continue Lotrisone cream as needed. New RX sent to [facility]. 2. Canasa suppositories one at bedtime for one month. 3. Return to the office to see Dr. [name] in four weeks. Please call sooner if symptoms do not continue to improve or resolve.

## 2014-08-02 ENCOUNTER — Telehealth: Payer: Self-pay

## 2014-08-02 MED ORDER — HYDROCORTISONE 2.5 % RE CREA: 1.0000 "application " | TOPICAL_CREAM | Freq: Two times a day (BID) | RECTAL | Status: DC

## 2014-08-02 NOTE — Telephone Encounter (Signed)
Pt and his wife came by the office. She said they just went by the pharmacy, pt had a change in his insurance and the canasa suppositories are tier 4 and are $270.00 and they cannot afford it. They are requesting something that is tier 2. The pharmacy gave them a copy of their formulary and the only other medications in that category are hydrocortisone enema's or hydrocortisone rectal cream. They are both tier 2 and will still be $50-$80 but she said that would be better than $270. Magda Paganini, I have a copy of the formulary if you need it.

## 2014-08-02 NOTE — Telephone Encounter (Signed)
RX done for hydrocortisone cream BID for four weeks.

## 2014-08-03 NOTE — Assessment & Plan Note (Signed)
64 year old gentleman with history of rectal adenocarcinoma who presents with several week history of loose stools associated with overt fecal incontinence and fecal seepage. Symptoms improving at this time. Stool consistency more formed without incontinence. Less fecal seepage. Unremarkable rectal exam today. Perianal rash improved. History of friable anastomotic mucosa on colonoscopy last year. Trial of Canasa suppositories 1 at bedtime daily at bedtime for "rectal drainage". Suspect patient had gastroenteritis which is now resolving.  Patient to return to the office in 4 weeks for follow-up on fecal seepage. Call sooner if worsening symptoms.

## 2014-08-04 NOTE — Progress Notes (Signed)
CC'ED TO PCP 

## 2014-09-08 ENCOUNTER — Inpatient Hospital Stay (HOSPITAL_COMMUNITY)
Admission: AD | Admit: 2014-09-08 | Discharge: 2014-09-15 | DRG: 315 | Disposition: A | Discharge status: Home / Self Care | Payer: Medicare Other | Source: Other Acute Inpatient Hospital | Attending: Internal Medicine | Admitting: Internal Medicine

## 2014-09-08 ENCOUNTER — Encounter (HOSPITAL_COMMUNITY): Payer: Self-pay | Admitting: Internal Medicine

## 2014-09-08 DIAGNOSIS — K219 Gastro-esophageal reflux disease without esophagitis: Secondary | ICD-10-CM | POA: Diagnosis present

## 2014-09-08 DIAGNOSIS — R1030 Lower abdominal pain, unspecified: Secondary | ICD-10-CM | POA: Diagnosis present

## 2014-09-08 DIAGNOSIS — T451X5A Adverse effect of antineoplastic and immunosuppressive drugs, initial encounter: Secondary | ICD-10-CM | POA: Diagnosis present

## 2014-09-08 DIAGNOSIS — C20 Malignant neoplasm of rectum: Secondary | ICD-10-CM

## 2014-09-08 DIAGNOSIS — Z86718 Personal history of other venous thrombosis and embolism: Secondary | ICD-10-CM | POA: Diagnosis not present

## 2014-09-08 DIAGNOSIS — F419 Anxiety disorder, unspecified: Secondary | ICD-10-CM | POA: Diagnosis present

## 2014-09-08 DIAGNOSIS — Z923 Personal history of irradiation: Secondary | ICD-10-CM

## 2014-09-08 DIAGNOSIS — T82818A Embolism of vascular prosthetic devices, implants and grafts, initial encounter: Secondary | ICD-10-CM | POA: Diagnosis present

## 2014-09-08 DIAGNOSIS — R911 Solitary pulmonary nodule: Secondary | ICD-10-CM

## 2014-09-08 DIAGNOSIS — E876 Hypokalemia: Secondary | ICD-10-CM | POA: Diagnosis not present

## 2014-09-08 DIAGNOSIS — Z7901 Long term (current) use of anticoagulants: Secondary | ICD-10-CM | POA: Diagnosis not present

## 2014-09-08 DIAGNOSIS — I10 Essential (primary) hypertension: Secondary | ICD-10-CM | POA: Diagnosis present

## 2014-09-08 DIAGNOSIS — R918 Other nonspecific abnormal finding of lung field: Secondary | ICD-10-CM

## 2014-09-08 DIAGNOSIS — G620 Drug-induced polyneuropathy: Secondary | ICD-10-CM | POA: Diagnosis present

## 2014-09-08 DIAGNOSIS — Z85048 Personal history of other malignant neoplasm of rectum, rectosigmoid junction, and anus: Secondary | ICD-10-CM | POA: Diagnosis not present

## 2014-09-08 DIAGNOSIS — I82403 Acute embolism and thrombosis of unspecified deep veins of lower extremity, bilateral: Secondary | ICD-10-CM

## 2014-09-08 DIAGNOSIS — I2699 Other pulmonary embolism without acute cor pulmonale: Secondary | ICD-10-CM | POA: Diagnosis not present

## 2014-09-08 DIAGNOSIS — G622 Polyneuropathy due to other toxic agents: Secondary | ICD-10-CM | POA: Diagnosis not present

## 2014-09-08 DIAGNOSIS — E872 Acidosis, unspecified: Secondary | ICD-10-CM | POA: Diagnosis present

## 2014-09-08 DIAGNOSIS — G629 Polyneuropathy, unspecified: Secondary | ICD-10-CM

## 2014-09-08 DIAGNOSIS — R339 Retention of urine, unspecified: Secondary | ICD-10-CM | POA: Diagnosis present

## 2014-09-08 DIAGNOSIS — I82409 Acute embolism and thrombosis of unspecified deep veins of unspecified lower extremity: Secondary | ICD-10-CM

## 2014-09-08 DIAGNOSIS — Z9221 Personal history of antineoplastic chemotherapy: Secondary | ICD-10-CM

## 2014-09-08 DIAGNOSIS — Z86711 Personal history of pulmonary embolism: Secondary | ICD-10-CM | POA: Diagnosis not present

## 2014-09-08 DIAGNOSIS — I82402 Acute embolism and thrombosis of unspecified deep veins of left lower extremity: Secondary | ICD-10-CM

## 2014-09-08 DIAGNOSIS — Y838 Other surgical procedures as the cause of abnormal reaction of the patient, or of later complication, without mention of misadventure at the time of the procedure: Secondary | ICD-10-CM | POA: Diagnosis present

## 2014-09-08 DIAGNOSIS — I82401 Acute embolism and thrombosis of unspecified deep veins of right lower extremity: Secondary | ICD-10-CM

## 2014-09-08 LAB — URINALYSIS, ROUTINE W REFLEX MICROSCOPIC
Glucose, UA: NEGATIVE mg/dL
Ketones, ur: NEGATIVE mg/dL
Leukocytes, UA: NEGATIVE
Nitrite: NEGATIVE
Protein, ur: 100 mg/dL — AB
Specific Gravity, Urine: >1.03 — ABNORMAL HIGH (ref 1.005–1.030)
Urobilinogen, UA: 0.2 mg/dL (ref 0.0–1.0)
pH: 6.5 (ref 5.0–8.0)

## 2014-09-08 LAB — URINE MICROSCOPIC-ADD ON

## 2014-09-08 LAB — MRSA PCR SCREENING: MRSA by PCR: NEGATIVE

## 2014-09-08 LAB — PROTIME-INR
INR: 1.15 (ref 0.00–1.49)
Prothrombin Time: 14.9 seconds (ref 11.6–15.2)

## 2014-09-08 LAB — APTT: aPTT: 42 seconds — ABNORMAL HIGH (ref 24–37)

## 2014-09-08 LAB — LACTIC ACID, PLASMA: Lactic Acid, Venous: 3.4 mmol/L (ref 0.5–2.0)

## 2014-09-08 LAB — HEPARIN LEVEL (UNFRACTIONATED): Heparin Unfractionated: 1.24 IU/mL — ABNORMAL HIGH (ref 0.30–0.70)

## 2014-09-08 LAB — PROCALCITONIN: Procalcitonin: <0.1 ng/mL

## 2014-09-08 MED ORDER — ONDANSETRON HCL 4 MG/2ML IJ SOLN
4.0000 mg | Freq: Four times a day (QID) | INTRAMUSCULAR | Status: DC | PRN
  Administered 2014-09-08: 4 mg via INTRAVENOUS
  Filled 2014-09-08: qty 2

## 2014-09-08 MED ORDER — GABAPENTIN 300 MG PO CAPS
900.0000 mg | ORAL_CAPSULE | Freq: Two times a day (BID) | ORAL | Status: DC
  Administered 2014-09-08 – 2014-09-15 (×14): 900 mg via ORAL
  Filled 2014-09-08 (×15): qty 3

## 2014-09-08 MED ORDER — ALPRAZOLAM 0.25 MG PO TABS
0.2500 mg | ORAL_TABLET | Freq: Once | ORAL | Status: AC
  Administered 2014-09-08: 0.25 mg via ORAL
  Filled 2014-09-08: qty 1

## 2014-09-08 MED ORDER — VANCOMYCIN HCL IN DEXTROSE 750-5 MG/150ML-% IV SOLN
750.0000 mg | Freq: Two times a day (BID) | INTRAVENOUS | Status: DC
  Administered 2014-09-08 – 2014-09-11 (×6): 750 mg via INTRAVENOUS
  Filled 2014-09-08 (×7): qty 150

## 2014-09-08 MED ORDER — ACETAMINOPHEN 650 MG RE SUPP: 650.0000 mg | Freq: Four times a day (QID) | RECTAL | Status: DC | PRN

## 2014-09-08 MED ORDER — SODIUM CHLORIDE 0.9 % IV SOLN
INTRAVENOUS | Status: AC
  Administered 2014-09-08: 16:00:00 via INTRAVENOUS

## 2014-09-08 MED ORDER — ONDANSETRON HCL 4 MG PO TABS: 4.0000 mg | ORAL_TABLET | Freq: Four times a day (QID) | ORAL | Status: DC | PRN

## 2014-09-08 MED ORDER — SODIUM CHLORIDE 0.9 % IJ SOLN
3.0000 mL | Freq: Two times a day (BID) | INTRAMUSCULAR | Status: DC
  Administered 2014-09-11 – 2014-09-15 (×8): 3 mL via INTRAVENOUS

## 2014-09-08 MED ORDER — HEPARIN (PORCINE) IN NACL 100-0.45 UNIT/ML-% IJ SOLN
1050.0000 [IU]/h | INTRAMUSCULAR | Status: DC
  Administered 2014-09-08: 1350 [IU]/h via INTRAVENOUS
  Administered 2014-09-09: 1100 [IU]/h via INTRAVENOUS
  Filled 2014-09-08 (×6): qty 250

## 2014-09-08 MED ORDER — MORPHINE SULFATE 2 MG/ML IJ SOLN: 1.0000 mg | INTRAMUSCULAR | Status: DC | PRN

## 2014-09-08 MED ORDER — OXYCODONE HCL 5 MG PO TABS
5.0000 mg | ORAL_TABLET | ORAL | Status: DC | PRN
  Administered 2014-09-09: 5 mg via ORAL
  Filled 2014-09-08: qty 1

## 2014-09-08 MED ORDER — PANTOPRAZOLE SODIUM 40 MG PO TBEC
40.0000 mg | DELAYED_RELEASE_TABLET | Freq: Every day | ORAL | Status: DC
  Administered 2014-09-08 – 2014-09-10 (×3): 40 mg via ORAL
  Filled 2014-09-08 (×3): qty 1

## 2014-09-08 MED ORDER — ACETAMINOPHEN 325 MG PO TABS: 650.0000 mg | ORAL_TABLET | Freq: Four times a day (QID) | ORAL | Status: DC | PRN

## 2014-09-08 MED ORDER — PIPERACILLIN-TAZOBACTAM 3.375 G IVPB
3.3750 g | Freq: Three times a day (TID) | INTRAVENOUS | Status: DC
  Administered 2014-09-08 – 2014-09-11 (×9): 3.375 g via INTRAVENOUS
  Filled 2014-09-08 (×11): qty 50

## 2014-09-08 MED ORDER — ALPRAZOLAM 0.25 MG PO TABS: 0.2500 mg | ORAL_TABLET | Freq: Once | ORAL | Status: DC

## 2014-09-08 NOTE — Progress Notes (Signed)
CRITICAL VALUE ALERT  Critical value received:  Lactic acid  Date of notification:  09/08/2014  Time of notification:  1941  Critical value read back:Yes.    Nurse who received alert:  Ruthe Mannan  MD notified (1st page):  Dr Curly Rim  Time of first page:  [phone]  MD notified (2nd page):  Time of second page:  Responding MD:  No new orders, was 5.8 at [facility].  Time MD responded:na

## 2014-09-08 NOTE — Progress Notes (Signed)
ANTICOAGULATION CONSULT NOTE - Follow Up Consult  Pharmacy Consult for Heparin Indication: pulmonary embolus  Allergies  Allergen Reactions  . Trazodone And Nefazodone Other (See Comments)    hallucinations    Patient Measurements: Height: '6\' 2"'$  (188 cm) Weight: 223 lb 15.8 oz (101.6 kg) IBW/kg (Calculated) : 82.2  Vital Signs: Temp: 98.2 F (36.8 C) (05/06 2027) Temp Source: Oral (05/06 2027) BP: 118/65 mmHg (05/06 2027) Pulse Rate: 104 (05/06 2027)  Labs:  Recent Labs  09/08/14 1510 09/08/14 2133  APTT 42*  --   LABPROT 14.9  --   INR 1.15  --   HEPARINUNFRC  --  1.24*    CrCl cannot be calculated (Patient has no serum creatinine result on file.).   Assessment: 64 year old male continues on heparin for PE.  Initial heparin level elevated at 1.24 likely due to Lovenox given this AM  Goal of Therapy:  Heparin level 0.3-0.7 units/ml Monitor platelets by anticoagulation protocol: Yes   Plan:  Decrease heparin to 1100 units / hr Follow up AM labs  Thank you. Anette Guarneri, PharmD [phone]  09/08/2014,10:40 PM

## 2014-09-08 NOTE — Progress Notes (Signed)
ANTICOAGULATION CONSULT NOTE - Initial Consult  Pharmacy Consult for vancomycin, zosyn and heparin Indication: pulmonary embolus, sepsis  No Known Allergies  Patient Measurements: Height: '6\' 2"'$  (188 cm) Weight: 223 lb 15.8 oz (101.6 kg) IBW/kg (Calculated) : 82.2 Heparin Dosing Weight: 90 kg  Vital Signs: Temp: 98.1 F (36.7 C) (05/06 1344) Temp Source: Oral (05/06 1344) BP: 130/82 mmHg (05/06 1344) Pulse Rate: 95 (05/06 1344)  Labs: No results for input(s): HGB, HCT, PLT, APTT, LABPROT, INR, HEPARINUNFRC, CREATININE, CKTOTAL, CKMB, TROPONINI in the last 72 hours.  CrCl cannot be calculated (Patient has no serum creatinine result on file.).   Medical History: Past Medical History  Diagnosis Date  . HTN (hypertension)   . Pulmonary embolism   . GERD (gastroesophageal reflux disease)   . Depression 04/01/2011  . DVT (deep venous thrombosis) 05/09/2011  . 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 Colon cancer 07/24/10    rectal ca, inv adenocarcinoma  . Hx of radiation therapy 09/02/10 to 10/14/10    pelvis  . Rectal cancer 01/15/2011    S/P radiation and concurrent 5-FU continuous infusion from 09/09/10- 10/10/10.  S/P proctectomy with colorectal anastomosis and diverting loop ileostomy on 11/14/10 at [facility] by Dr. Harlon Ditty. Pathology reveals a pT3b N1 with 3/20 lymph nodes.      Medications:  See medication history  Assessment: 64 yo man transferred from [place] with sepsis and PE. He received zosyn 3.375 gm @ 07:35 and vancomycin 1250 mg IV '@07'$ :35.  He also received lovenox 100 mg sq at 08:20.  LA elevated at 5.3, WBC 11.9, SrCr 1.04, CrCl ~85 ml/min  Goal of Therapy:  Heparin level 0.3-0.7 units/ml Monitor platelets by anticoagulation protocol: Yes  Vanc trough 15-20 mg/L   Plan:  Cont zosyn 3.375 gm IV q8 hours IE Cont vancomycin 750 mg IV q12 hours Heparin drip at 1350 units/hr. Check heparin level 6 hours after start. Daily HL and CBC. Monitor renal  function, cultures and clinical course.  Thanks for allowing pharmacy to be a part of this patient's care.  Excell Seltzer, PharmD Clinical Pharmacist, [phone] 09/08/2014,3:08 PM

## 2014-09-08 NOTE — H&P (Signed)
[facility] History and Physical  [name]:[id number] DOB: Mar 29, 1951 DOA: 09/08/2014   PCP: [name], MD  Specialists: Patient is followed by Dr. [name]. Gastroenterology in [facility]. Also followed at the [facility].  Chief Complaint: Lower abdominal pain since this morning with dizziness  HPI: Charles [name] is a 64 y.o. male with a past medical history of rectal cancer status post surgery at [facility] in 2012 and finished the chemotherapy in January 2013, history of DVT and has been off of anticoagulation since 2013, history of neuropathy secondary to chemotherapy. He was in his usual state of health till this morning at 1 AM when he woke up feeling diaphoretic. He went to the bathroom, urinated with some difficulty and had a small bowel movement. He felt very dizzy and almost passed out. He was able to go to his bed and sleep till about 5:30 this morning. He woke up with similar symptoms and his wife called 911. Denied any chest pain. But did have some shortness of breath. Had some nausea with dry heaves. He tells me that about 4 days ago, he had episodes of diarrhea which have resolved. He was also seen by his gastroenterologist in end of March for issues with fecal incontinence and fecal seepage. He was prescribed Canasa suppositories with which he has shown some improvement. He also had lower abdominal pain this morning.   He was taken to the emergency department at [facility]. Over there, there was a suspicion about aortic dissection, so a CT scan was done. It did not show dissection, but did show acute pulmonary embolism. He also had urinary retention and a Foley catheter had to be placed. Elevated lactic acid level was also noted. Patient was subsequently transferred over to this hospital for further management.  Home Medications: Medication list needs to be reconciled by pharmacy. Prior to Admission medications     Medication Sig Start Date End Date Taking? Authorizing Provider  ALPHA LIPOIC ACID PO Take 200 mg by mouth 2 (two) times daily.    Historical Provider, MD  clotrimazole-betamethasone (LOTRISONE) cream Apply 1 application topically 2 (two) times daily. 08/01/14   Mahala Menghini, PA-C  diphenhydramine-acetaminophen (TYLENOL PM) 25-500 MG TABS Take 1 tablet by mouth at bedtime as needed (sleep).     Historical Provider, MD  gabapentin (NEURONTIN) 300 MG capsule Take 3 in AM, 2 in afternoon, and 3 at bedtime. Patient taking differently: 900 mg 2 (two) times daily.  05/18/14   Baird Cancer, PA-C  hydrocortisone (ANUSOL-HC) 2.5 % rectal cream Place 1 application rectally 2 (two) times daily. 08/02/14   Mahala Menghini, PA-C  ibuprofen (ADVIL,MOTRIN) 200 MG tablet Take 400 mg by mouth at bedtime as needed for pain.    Historical Provider, MD  omeprazole (PRILOSEC OTC) 20 MG tablet Take 20 mg by mouth daily.    Historical Provider, MD  Psyllium (METAMUCIL PO) Take by mouth 2 (two) times daily. Takes 2 tablespoons in the morning and 1 tablespoon in the evening    Historical Provider, MD  vitamin B-12 (CYANOCOBALAMIN) 1000 MCG tablet Take 1,000 mcg by mouth daily.    Historical Provider, MD  vitamin E 400 UNIT capsule Take 400 Units by mouth daily.    Historical Provider, MD    Allergies: No Known Allergies  Past Medical History: Past Medical History  Diagnosis Date  . HTN (hypertension)   . Pulmonary embolism   . GERD (gastroesophageal reflux disease)   .  Depression 04/01/2011  . DVT (deep venous thrombosis) 05/09/2011  . 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 Colon cancer 07/24/10    rectal ca, inv adenocarcinoma  . Hx of radiation therapy 09/02/10 to 10/14/10    pelvis  . Rectal cancer 01/15/2011    S/P radiation and concurrent 5-FU continuous infusion from 09/09/10- 10/10/10.  S/P proctectomy with colorectal anastomosis and diverting loop ileostomy on 11/14/10 at [facility] by Dr. Harlon Ditty. Pathology  reveals a pT3b N1 with 3/20 lymph nodes.      Past Surgical History  Procedure Laterality Date  . Esophagogastroduodenoscopy  07/2010    RMR: schatki ring s/p dilation, small hh, SB bx benign  . Colonoscopy  07/2010    proximal rectal apple core mass 10-14cm from anal verge (adenocarcinoma), 2-3cm distal rectal carpet polyp s/p piecemeal snare polypectomy (adenoma)  . Eus  08/2010    Dr. Owens Loffler. uT3N0 circumferential, nearly obstruction rectosigmoid adenocarcinoma, distal edge 12cm from anal verge  . Ivc filter    . Colon surgery  11/14/2010    proctectomy with colorectal anastomosis and diverting loop ileostomy (temporary planned)  . Port a cath placement    . Colostomy reversal  april 2013  . Port-a-cath removal  09/24/2011    Procedure: REMOVAL PORT-A-CATH;  Surgeon: Donato Heinz, MD;  Location: [facility];  Service: General;  Laterality: N/A;  Minor Room  . Ivc filter    . Colonoscopy  04/21/2012    RMR: Friable,fibrotic appearing colorectal anastomosis producing some luminal narrowing-not felt to be critical. path: focal erosion with slight inflammation and hyperemia. SURVEILLANCE DUE DEC 2015  . Colonoscopy N/A 11/24/2013    Dr. Rourk:somewhat fibrotic/friable anastomotic mucosal-status post biopsy (narrowing not felt to be clinically significant) Single colonic diverticulum. benign polypoid rectal mucosa    Social History: Patient lives in [place] with his wife. Denies smoking, alcohol use currently or illicit drug use. Usually independent with daily activities.  Family History:  Family History  Problem Relation Age of Onset  . Colon cancer Neg Hx   . Liver disease Neg Hx   . Inflammatory bowel disease Neg Hx   . Cancer Brother     throat  . Cancer Brother     prostate     Review of Systems - History obtained from the patient General ROS: positive for  - fatigue Psychological ROS: positive for - anxiety Ophthalmic ROS: negative ENT ROS: negative Allergy  and Immunology ROS: negative Hematological and Lymphatic ROS: negative Endocrine ROS: negative Respiratory ROS: as in hpi Cardiovascular ROS: as in hpi Gastrointestinal ROS:as in hpi Genito-Urinary ROS: as in hpi Musculoskeletal ROS: negative Neurological ROS: no TIA or stroke symptoms Dermatological ROS: negative  Physical Examination  Filed Vitals:   09/08/14 1344  BP: 130/82  Pulse: 95  Temp: 98.1 F (36.7 C)  TempSrc: Oral  Resp: 20  Height: '6\' 2"'$  (1.88 m)  Weight: 101.6 kg (223 lb 15.8 oz)  SpO2: 96%    BP 130/82 mmHg  Pulse 95  Temp(Src) 98.1 F (36.7 C) (Oral)  Resp 20  Ht '6\' 2"'$  (1.88 m)  Wt 101.6 kg (223 lb 15.8 oz)  BMI 28.75 kg/m2  SpO2 96%  General appearance: alert, cooperative, appears stated age and no distress Head: Normocephalic, without obvious abnormality, atraumatic Eyes: conjunctivae/corneas clear. PERRL, EOM's intact.  Throat: lips, mucosa, and tongue normal; teeth and gums normal Neck: no adenopathy, no carotid bruit, no JVD, supple, symmetrical, trachea midline and thyroid not enlarged,  symmetric, no tenderness/mass/nodules Resp: Diminished air entry at the bases without any crackles or wheezing. No rhonchi. Cardio: regular rate and rhythm, S1, S2 normal, no murmur, click, rub or gallop GI: Soft. Tenderness in the suprapubic area without any rebound, rigidity or guarding. No masses or organomegaly.. Extremities: extremities normal, atraumatic, no cyanosis or edema Pulses: Good dorsalis pedis pulses both lower extremity. Skin: Skin color, texture, turgor normal. No rashes or lesions Lymph nodes: Cervical, supraclavicular, and axillary nodes normal. Neurologic: No focal deficits.  Laboratory Data: No results found for this or any previous visit (from the past 48 hour(s)).  All labs were done at [facility]. His sodium 141, potassium 4.0, chloride 103, bicarbonate 20. BUN 17, creatinine 1.0. Glucose 263. Lactic acid 5.7. D-dimer greater than 20.  INR 1.0. WBC 11.9, hemoglobin 15.3, platelet counts greater than 200. AST, ALT normal, bilirubin normal. Troponin less than 0.01. BNP 20. ABG showed a pH of 7.38, PCO2 26, PO2 91. UA showed large blood, 5-10 RBCs, 0-5 WBCs, negative nitrite, negative leukocytes.  Radiology Reports: No results found. [facility] CT report shows multisegmental pulmonary embolism in the right upper lobe. IVC filter in good position. 2 cm right middle lobe nodule. Possible cystitis  Electrocardiogram: EKG done at [facility] showed sinus rhythm at 95 bpm. Normal axis. Intervals were normal. No concerning ST or T-wave changes.  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 He had urinary retention. CT scan showed acute pulmonary embolism. He has a history of right lower extremity DVT which was diagnosed in 2012. He was on Coumadin up until January 2013. IVC filter was placed prior to his surgery for rectal cancer.  Plan: #1 acute pulmonary embolism: We will initiate intravenous heparin. Patient denies any blood per rectum or any other source of bleeding. He has an IVC filter which has been in place since 2012 according to the patient. Will get Lower extremity Doppler studies. History of cancer is likely contributing to his hypercoagulable state. Patient may need to be sent out on Lovenox and further management to be addressed by his oncologist at [facility]. Discussed with the pulmonology. IVC filter has been in for more than 3 years and cannot be  removed. They did not recommend any different management.  #2 lower abdominal pain: Likely due to urinary retention: CT scan did not show any intra-abdominal acute findings. Cystitis was suspected. UA, however, does not show any infection. We will repeat it here. This could be radiation induced. Continue to monitor clinically.  #3 Lactic acidosis with presumed sepsis: Lactic acid was significantly elevated at greater than 5. No obvious source of infection. It could all be due to his above-mentioned problem. Lactic acid level will be repeated along with pro-calcitonin. Blood cultures will be repeated along with urine cultures. It is reasonable to continue broad-spectrum antibiotics for now until we have more data.  #4 Urinary retention: Leave Foley catheter in for now. Could try voiding trial once he is more stable. May need urology input as an outpatient.  #5 history of rectal cancer:  He is not on chemotherapy or radiation at this time. He is followed by oncology in [facility]. He has experienced some fecal incontinent and seepage recently. Has seen his gastroenterologist for the same and has improved. Continue to monitor.  #6 history of neuropathy secondary to chemotherapy: Continue with Neurontin  #7 Incidental finding of a right middle lobe 2 cm nodule: Will need outpatient follow-up.  DVT Prophylaxis: On full anticoagulation now Code Status: Full code Family Communication: Discussed with the patient and his wife  Disposition Plan: Remain in step down for today.   Further management decisions will depend on results of further testing and patient's response to treatment.   [facility]  [facility] Pager [phone]  If 7PM-7AM, please contact night-coverage [email or web address] Password [facility]  09/08/2014, 2:38 PM

## 2014-09-08 NOTE — Progress Notes (Signed)
Utilization review completed.  

## 2014-09-09 ENCOUNTER — Inpatient Hospital Stay (HOSPITAL_COMMUNITY): Payer: Medicare Other

## 2014-09-09 DIAGNOSIS — G622 Polyneuropathy due to other toxic agents: Secondary | ICD-10-CM

## 2014-09-09 DIAGNOSIS — Z86711 Personal history of pulmonary embolism: Secondary | ICD-10-CM

## 2014-09-09 DIAGNOSIS — R911 Solitary pulmonary nodule: Secondary | ICD-10-CM

## 2014-09-09 DIAGNOSIS — Z923 Personal history of irradiation: Secondary | ICD-10-CM

## 2014-09-09 LAB — URINE CULTURE
Colony Count: NO GROWTH
Culture: NO GROWTH

## 2014-09-09 LAB — COMPREHENSIVE METABOLIC PANEL
ALT: 15 U/L — ABNORMAL LOW (ref 17–63)
AST: 17 U/L (ref 15–41)
Albumin: 3.1 g/dL — ABNORMAL LOW (ref 3.5–5.0)
Alkaline Phosphatase: 56 U/L (ref 38–126)
Anion gap: 8 (ref 5–15)
BUN: 14 mg/dL (ref 6–20)
CO2: 20 mmol/L — ABNORMAL LOW (ref 22–32)
Calcium: 8.2 mg/dL — ABNORMAL LOW (ref 8.9–10.3)
Chloride: 106 mmol/L (ref 101–111)
Creatinine, Ser: 0.99 mg/dL (ref 0.61–1.24)
GFR calc Af Amer: >60 mL/min (ref >60)
GFR calc non Af Amer: >60 mL/min (ref >60)
Glucose, Bld: 178 mg/dL — ABNORMAL HIGH (ref 70–99)
Potassium: 3.8 mmol/L (ref 3.5–5.1)
Sodium: 134 mmol/L — ABNORMAL LOW (ref 135–145)
Total Bilirubin: 1.4 mg/dL — ABNORMAL HIGH (ref 0.3–1.2)
Total Protein: 5.9 g/dL — ABNORMAL LOW (ref 6.5–8.1)

## 2014-09-09 LAB — CBC
HCT: 38.5 % — ABNORMAL LOW (ref 39.0–52.0)
Hemoglobin: 13.2 g/dL (ref 13.0–17.0)
MCH: 30.1 pg (ref 26.0–34.0)
MCHC: 34.3 g/dL (ref 30.0–36.0)
MCV: 87.7 fL (ref 78.0–100.0)
Platelets: 179 10*3/uL (ref 150–400)
RBC: 4.39 MIL/uL (ref 4.22–5.81)
RDW: 13 % (ref 11.5–15.5)
WBC: 10 10*3/uL (ref 4.0–10.5)

## 2014-09-09 LAB — LACTIC ACID, PLASMA
Lactic Acid, Venous: 2.1 mmol/L (ref 0.5–2.0)
Lactic Acid, Venous: 2.5 mmol/L (ref 0.5–2.0)

## 2014-09-09 LAB — HEPARIN LEVEL (UNFRACTIONATED)
Heparin Unfractionated: 0.86 IU/mL — ABNORMAL HIGH (ref 0.30–0.70)
Heparin Unfractionated: 0.6 IU/mL (ref 0.30–0.70)

## 2014-09-09 MED ORDER — TAMSULOSIN HCL 0.4 MG PO CAPS
0.4000 mg | ORAL_CAPSULE | Freq: Every day | ORAL | Status: DC
  Administered 2014-09-09 – 2014-09-15 (×7): 0.4 mg via ORAL
  Filled 2014-09-09 (×7): qty 1

## 2014-09-09 MED ORDER — SODIUM CHLORIDE 0.9 % IV SOLN
INTRAVENOUS | Status: DC
  Administered 2014-09-09: 20:00:00 via INTRAVENOUS
  Administered 2014-09-11: 20 mL/h via INTRAVENOUS
  Administered 2014-09-11 (×2): via INTRAVENOUS
  Administered 2014-09-11: 20 mL/h via INTRAVENOUS

## 2014-09-09 NOTE — Progress Notes (Signed)
ANTICOAGULATION CONSULT NOTE - Follow Up Consult  Pharmacy Consult for Heparin Indication: PE / DVT  Allergies  Allergen Reactions  . Trazodone And Nefazodone Other (See Comments)    hallucinations    Patient Measurements: Height: '6\' 2"'$  (188 cm) Weight: 223 lb 15.8 oz (101.6 kg) IBW/kg (Calculated) : 82.2  Vital Signs: Temp: 99.3 F (37.4 C) (05/07 1700) Temp Source: Oral (05/07 1700) BP: 134/74 mmHg (05/07 1700) Pulse Rate: 95 (05/07 1300)  Labs:  Recent Labs  09/08/14 1510 09/08/14 2133 09/09/14 0455 09/09/14 0905 09/09/14 1830  HGB  --   --  13.2  --   --   HCT  --   --  38.5*  --   --   PLT  --   --  179  --   --   APTT 42*  --   --   --   --   LABPROT 14.9  --   --   --   --   INR 1.15  --   --   --   --   HEPARINUNFRC  --  1.24*  --  0.86* 0.60  CREATININE  --   --  0.99  --   --     Estimated Creatinine Clearance: 97.2 mL/min (by C-G formula based on Cr of 0.99).  Assessment: 64 year old male continues on heparin for PE. Initial heparin level elevated at 1.24 likely due to Lovenox given on 5/6. 5/7 am HL was supratherapeutic at 0.86 but trending down. CBC stable, no issues per nurse.  Follow-up HL is therapeutic at 0.6 on heparin 1000 units/hr. No issues with infusion or bleeding noted.  Goal of Therapy:  Heparin level 0.3-0.7 units/ml Monitor platelets by anticoagulation protocol: Yes   Plan:  Continue Heparin 1000 units/hr  Daily HL/CBC Monitor s/sx of bleeding F/u long-term anticoag plans  Andrey Cota. Diona Foley, PharmD Clinical Pharmacist Pager [phone] Sharilyn Sites M 09/09/2014,7:17 PM

## 2014-09-09 NOTE — Progress Notes (Addendum)
Bilateral lower extremity venous duplex completed.  Positive for DVT in the bilateral common femoral, profunda, femoral veins.  Positive for DVT in the right popliteal veins.  Negative for superficial thrombosis or Baker's cyst.

## 2014-09-09 NOTE — Progress Notes (Addendum)
[place] TEAM 1 - Stepdown/ICU TEAM Progress Note  Charles [name]:[id number] DOB: 1951/03/10 DOA: 09/08/2014 PCP: [name], MD  Admit HPI / Brief Narrative: Charles [name] is a 64 y.o. WM PMHx Rectal cancer status post surgery at [facility] in 2012 and finished the chemotherapy in January 2013, history of DVT and has been off of anticoagulation since 2013, history of neuropathy secondary to chemotherapy.  Woke up feeling diaphoretic. He went to the bathroom, urinated with some difficulty and had a small bowel movement. He felt very dizzy and almost passed out. He was able to go to his bed and sleep till about 5:30 this morning. He woke up with similar symptoms and his wife called 911. Denied any chest pain. But did have some shortness of breath. Had some nausea with dry heaves. He tells me that about 4 days ago, he had episodes of diarrhea which have resolved. He was also seen by his gastroenterologist in end of March for issues with fecal incontinence and fecal seepage. He was prescribed Canasa suppositories with which he has shown some improvement. He also had lower abdominal pain this morning.   He was taken to the emergency department at [facility]. Over there, there was a suspicion about aortic dissection, so a CT scan was done. It did not show dissection, but did show acute pulmonary embolism. He also had urinary retention and a Foley catheter had to be placed. Elevated lactic acid level was also noted. Patient was subsequently transferred over to this hospital for further management.  HPI/Subjective: 5/7 A/O 4, states this is his third blood clot. States first DVT May 2012 just after finishing his chemotherapy/XRT, second DVT was 2013 when they attempted to remove his IVC filter (IVC filter still present, secondary to becoming epithelialized).   Assessment/Plan: Acute PE/Bilateral lower extremity DVT; -This is patient's third DVT/PE - will initiate intravenous  heparin. Patient denies any blood per rectum or any other source of bleeding.  -IVC filter which has been in place since 2012 according to the patient.  -Lower extremity Doppler studies.  -Will need to be on anticoagulation for life. -Contact hematology in a.m. discuss choices of anticoagulant with an IVC filter present  lower abdominal pain/urinary retention: - Likely due to urinary retention: CT scan did not show any intra-abdominal acute findings. Cystitis was suspected.  -Repeat UA, Radiation induced? -Start Flomax 0.4 mg daily -Maintain Foley overnight  Lactic acidosis with presumed sepsis vs  acute PE -Lactic acid was significantly elevated at greater than 5 on admission -Blood culture is pending -Urine culture pending -Normal saline at. 125 ml/hr -No obvious source of infection.  Urinary retention:  -Leave Foley catheter in for now.  -Start Flomax 0.4 mg daily  -Will try removing Foley catheter in a.m. for voiding trial  Hx Rectal Cancer:  -S/P chemotherapy and XRT  -followed by oncology in [facility].   Neuropathy secondary to chemotherapy:  -Continue Neurontin 900 mg BID  Incidental finding of a right middle lobe 2 cm nodule:  -Will need outpatient follow-up.   Code Status: FULL Family Communication: no family present at time of exam Disposition Plan: resolution Sepsis    Consultants:   Procedure/Significant Events: 5/7 lower extremity Doppler; Positive for DVT in the bilateral common femoral, profunda, femoral veins. Positive for DVT in the right popliteal veins   Culture 5/6 MRSA by PCR negative   Antibiotics: Zosyn 5/6>> Vancomycin 5/6>>   DVT prophylaxis: Heparin drip   Devices  LINES / TUBES:      Continuous Infusions: . sodium chloride 125 mL/hr at 09/10/14 0300  . heparin 1,050 Units/hr (09/10/14 0806)    Objective: VITAL SIGNS: Temp: 98.7 F (37.1 C) (05/08 0700) Temp Source: Oral (05/08 0700) BP: 124/72  mmHg (05/08 0310) Pulse Rate: 90 (05/08 0310) SPO2; FIO2:   Intake/Output Summary (Last 24 hours) at 09/10/14 0816 Last data filed at 09/10/14 0725  Gross per 24 hour  Intake 1761.75 ml  Output   2750 ml  Net -988.25 ml     Exam: General: A/O 4, NAD, No acute respiratory distress Lungs: Clear to auscultation bilaterally without wheezes or crackles Cardiovascular: Tachycardic, Regular rhythm without murmur gallop or rub normal S1 and S2 Abdomen: Nontender, nondistended, soft, bowel sounds positive, no rebound, no ascites, no appreciable mass Extremities: No significant cyanosis, clubbing, or edema bilateral lower extremities  Data Reviewed: Basic Metabolic Panel:  Recent Labs Lab 09/09/14 0455 09/10/14 0305  NA 134* 137  K 3.8 3.7  CL 106 106  CO2 20* 23  GLUCOSE 178* 131*  BUN 14 10  CREATININE 0.99 0.84  CALCIUM 8.2* 8.6*   Liver Function Tests:  Recent Labs Lab 09/09/14 0455 09/10/14 0305  AST 17 16  ALT 15* 15*  ALKPHOS 56 60  BILITOT 1.4* 1.1  PROT 5.9* 5.3*  ALBUMIN 3.1* 3.0*   No results for input(s): LIPASE, AMYLASE in the last 168 hours. No results for input(s): AMMONIA in the last 168 hours. CBC:  Recent Labs Lab 09/09/14 0455 09/10/14 0305  WBC 10.0 9.1  NEUTROABS  --  5.3  HGB 13.2 12.7*  HCT 38.5* 36.8*  MCV 87.7 87.6  PLT 179 169   Cardiac Enzymes: No results for input(s): CKTOTAL, CKMB, CKMBINDEX, TROPONINI in the last 168 hours. BNP (last 3 results) No results for input(s): BNP in the last 8760 hours.  ProBNP (last 3 results) No results for input(s): PROBNP in the last 8760 hours.  CBG: No results for input(s): GLUCAP in the last 168 hours.  Recent Results (from the past 240 hour(s))  MRSA PCR Screening     Status: None   Collection Time: 09/08/14  1:46 PM  Result Value Ref Range Status   MRSA by PCR NEGATIVE NEGATIVE Final    Comment:        The GeneXpert MRSA Assay (FDA approved for NASAL specimens only), is one  component of a comprehensive MRSA colonization surveillance program. It is not intended to diagnose MRSA infection nor to guide or monitor treatment for MRSA infections.   Culture, blood (x 2)     Status: None (Preliminary result)   Collection Time: 09/08/14  3:00 PM  Result Value Ref Range Status   Specimen Description BLOOD RIGHT HAND  Final   Special Requests BOTTLES DRAWN AEROBIC ONLY 5CC  Final   Culture   Final           BLOOD CULTURE RECEIVED NO GROWTH TO DATE CULTURE WILL BE HELD FOR 5 DAYS BEFORE ISSUING A FINAL NEGATIVE REPORT Performed at [facility]    Report Status PENDING  Incomplete  Culture, blood (x 2)     Status: None (Preliminary result)   Collection Time: 09/08/14  3:10 PM  Result Value Ref Range Status   Specimen Description BLOOD LEFT HAND  Final   Special Requests BOTTLES DRAWN AEROBIC ONLY 2CC  Final   Culture   Final           BLOOD CULTURE  RECEIVED NO GROWTH TO DATE CULTURE WILL BE HELD FOR 5 DAYS BEFORE ISSUING A FINAL NEGATIVE REPORT Note: Culture results may be compromised due to an inadequate volume of blood received in culture bottles. Performed at [facility]    Report Status PENDING  Incomplete  Urine culture     Status: None   Collection Time: 09/08/14  5:08 PM  Result Value Ref Range Status   Specimen Description URINE, CATHETERIZED  Final   Special Requests NONE  Final   Colony Count NO GROWTH Performed at [facility]   Final   Culture NO GROWTH Performed at [facility]   Final   Report Status 09/09/2014 FINAL  Final     Studies:  Recent x-ray studies have been reviewed in detail by the Attending Physician  Scheduled Meds:  Scheduled Meds: . gabapentin  900 mg Oral BID  . pantoprazole  40 mg Oral Q1200  . piperacillin-tazobactam (ZOSYN)  IV  3.375 g Intravenous Q8H  . sodium chloride  3 mL Intravenous Q12H  . tamsulosin  0.4 mg Oral Daily  . vancomycin  750 mg Intravenous Q12H    Time  spent on care of this patient: 40 mins   Hodan Wurtz, Geraldo Docker , MD  [facility] Office  [phone] Pager [phone]  On-Call/Text Page:      [email or web address]      password [email or web address]  If 7PM-7AM, please contact night-coverage [email or web address] Password [email or web address] 09/10/2014, 8:16 AM   LOS: 2 days   Care during the described time interval was provided by me .  I have reviewed this patient's available data, including medical history, events of note, physical examination, radiology studies and test results as part of my evaluation  Dia Crawford, MD [phone] Pager

## 2014-09-09 NOTE — Progress Notes (Signed)
ANTICOAGULATION CONSULT NOTE - Follow Up Consult  Pharmacy Consult for Heparin Indication: PE / DVT  Allergies  Allergen Reactions  . Trazodone And Nefazodone Other (See Comments)    hallucinations    Patient Measurements: Height: '6\' 2"'$  (188 cm) Weight: 223 lb 15.8 oz (101.6 kg) IBW/kg (Calculated) : 82.2  Vital Signs: Temp: 98.2 F (36.8 C) (05/07 0454) Temp Source: Oral (05/07 0454) BP: 120/87 mmHg (05/07 0454) Pulse Rate: 98 (05/07 0454)  Labs:  Recent Labs  09/08/14 1510 09/08/14 2133 09/09/14 0455  HGB  --   --  13.2  HCT  --   --  38.5*  PLT  --   --  179  APTT 42*  --   --   LABPROT 14.9  --   --   INR 1.15  --   --   HEPARINUNFRC  --  1.24*  --   CREATININE  --   --  0.99    Estimated Creatinine Clearance: 97.2 mL/min (by C-G formula based on Cr of 0.99).  Assessment: 64 year old male continues on heparin for PE. Initial heparin level elevated at 1.24 likely due to Lovenox given on 5/6. 5/7 am HL was supratherapeutic at 0.86 but trending down. CBC stable, no issues per nurse.  Goal of Therapy:  Heparin level 0.3-0.7 units/ml Monitor platelets by anticoagulation protocol: Yes   Plan:  Decrease Heparin gtt to 1000 units/hr (Slight decrease to make sure we don't go subtherapeutic while treating PE and DVT) Monitor daily HL, CBC, s/s of bleed F/U HL @ [place] 09/09/2014,10:31 AM

## 2014-09-09 NOTE — Progress Notes (Signed)
PT Cancellation Note  Patient Details Name: LAWTON DOLLINGER MRN: [id number] DOB: 11-Sep-1950   Cancelled Treatment:    Reason Eval/Treat Not Completed: Medical issues which prohibited therapy (nursing asked PT to HOLD due to multiple new DVT's bil LEs)   Orli Degrave F 09/09/2014, 2:21 PM [place] [employer],PT Acute Rehabilitation [phone] (pager)

## 2014-09-09 NOTE — Progress Notes (Signed)
CRITICAL VALUE ALERT  Critical value received:  Lactic acid 2.1  Date of notification:  09/09/2014  Time of notification:  51761  Critical value read back:Yes.     Nurse who received alert:  Merlene Laughter RN  MD notified (1st page):  Dr. Sherral Hammers  Time of first page:  1655  MD notified (2nd page):  Time of second page:  Responding MD:  Dr. Sherral Hammers   Time MD responded:  [phone]

## 2014-09-10 DIAGNOSIS — R911 Solitary pulmonary nodule: Secondary | ICD-10-CM | POA: Diagnosis present

## 2014-09-10 LAB — COMPREHENSIVE METABOLIC PANEL
ALT: 15 U/L — ABNORMAL LOW (ref 17–63)
AST: 16 U/L (ref 15–41)
Albumin: 3 g/dL — ABNORMAL LOW (ref 3.5–5.0)
Alkaline Phosphatase: 60 U/L (ref 38–126)
Anion gap: 8 (ref 5–15)
BUN: 10 mg/dL (ref 6–20)
CO2: 23 mmol/L (ref 22–32)
Calcium: 8.6 mg/dL — ABNORMAL LOW (ref 8.9–10.3)
Chloride: 106 mmol/L (ref 101–111)
Creatinine, Ser: 0.84 mg/dL (ref 0.61–1.24)
GFR calc Af Amer: >60 mL/min (ref >60)
GFR calc non Af Amer: >60 mL/min (ref >60)
Glucose, Bld: 131 mg/dL — ABNORMAL HIGH (ref 70–99)
Potassium: 3.7 mmol/L (ref 3.5–5.1)
Sodium: 137 mmol/L (ref 135–145)
Total Bilirubin: 1.1 mg/dL (ref 0.3–1.2)
Total Protein: 5.3 g/dL — ABNORMAL LOW (ref 6.5–8.1)

## 2014-09-10 LAB — LACTIC ACID, PLASMA: Lactic Acid, Venous: 1.3 mmol/L (ref 0.5–2.0)

## 2014-09-10 LAB — HEPARIN LEVEL (UNFRACTIONATED): Heparin Unfractionated: 0.38 IU/mL (ref 0.30–0.70)

## 2014-09-10 LAB — CBC WITH DIFFERENTIAL/PLATELET
Basophils Absolute: 0 10*3/uL (ref 0.0–0.1)
Basophils Relative: 0 % (ref 0–1)
Eosinophils Absolute: 0.2 10*3/uL (ref 0.0–0.7)
Eosinophils Relative: 2 % (ref 0–5)
HCT: 36.8 % — ABNORMAL LOW (ref 39.0–52.0)
Hemoglobin: 12.7 g/dL — ABNORMAL LOW (ref 13.0–17.0)
Lymphocytes Relative: 31 % (ref 12–46)
Lymphs Abs: 2.8 10*3/uL (ref 0.7–4.0)
MCH: 30.2 pg (ref 26.0–34.0)
MCHC: 34.5 g/dL (ref 30.0–36.0)
MCV: 87.6 fL (ref 78.0–100.0)
Monocytes Absolute: 0.7 10*3/uL (ref 0.1–1.0)
Monocytes Relative: 8 % (ref 3–12)
Neutro Abs: 5.3 10*3/uL (ref 1.7–7.7)
Neutrophils Relative %: 59 % (ref 43–77)
Platelets: 169 10*3/uL (ref 150–400)
RBC: 4.2 MIL/uL — ABNORMAL LOW (ref 4.22–5.81)
RDW: 12.9 % (ref 11.5–15.5)
WBC: 9.1 10*3/uL (ref 4.0–10.5)

## 2014-09-10 NOTE — Progress Notes (Addendum)
[facility] TEAM 1 - Stepdown/ICU TEAM Progress Note  BEACHER EVERY VWU:[id number] DOB: October 29, 1950 DOA: 09/08/2014 PCP: [name], MD  Admit HPI / Brief Narrative: Charles [name] is a 64 y.o. WM PMHx Rectal cancer status post surgery at [facility] in 2012 and finished the chemotherapy in January 2013, history of DVT and has been off of anticoagulation since 2013, history of neuropathy secondary to chemotherapy.  Woke up feeling diaphoretic. He went to the bathroom, urinated with some difficulty and had a small bowel movement. He felt very dizzy and almost passed out. He was able to go to his bed and sleep till about 5:30 this morning. He woke up with similar symptoms and his wife called 911. Denied any chest pain. But did have some shortness of breath. Had some nausea with dry heaves. He tells me that about 4 days ago, he had episodes of diarrhea which have resolved. He was also seen by his gastroenterologist in end of March for issues with fecal incontinence and fecal seepage. He was prescribed Canasa suppositories with which he has shown some improvement. He also had lower abdominal pain this morning.   He was taken to the emergency department at [facility]. Over there, there was a suspicion about aortic dissection, so a CT scan was done. It did not show dissection, but did show acute pulmonary embolism. He also had urinary retention and a Foley catheter had to be placed. Elevated lactic acid level was also noted. Patient was subsequently transferred over to this hospital for further management.  HPI/Subjective: 5/8 A/O 4, states this is his third blood clot. States first DVT May 2012 just after finishing his chemotherapy/XRT, second DVT was 2013 when they attempted to remove his IVC filter (IVC filter still present, secondary to becoming epithelialized). Admitted for   Assessment/Plan: Acute PE/Bilateral lower extremity DVT; -This is patient's third DVT/PE - will initiate  intravenous heparin. Patient denies any blood per rectum or any other source of bleeding.   -IVC filter which has been in place since 2012 according to the patient.  -Lower extremity Doppler studies.  -Will need to be on anticoagulation for life. Consider Lovenox secondary to possible new primary neoplasm in RML lung -Contact hematology in a.m. discuss choices of anticoagulant with an IVC filter present  lower abdominal pain/urinary retention: - Likely due to urinary retention: CT scan did not show any intra-abdominal acute findings. Cystitis was suspected.  -Repeat UA, Radiation induced? -Continue Flomax 0.4 mg daily -Maintain Foley overnight  Lactic acidosis with presumed sepsis vs  acute PE -Lactic acid was significantly elevated at greater than 5 on admission -Blood/urine culture NGTD  -Normal saline at. 125 ml/hr -No obvious source of infection.  Urinary retention:  -Continue  Flomax 0.4 mg daily  -Will try removing Foley catheter this afternoon for voiding trial  Hx Rectal Cancer:  -S/P chemotherapy and XRT  -followed by oncology in [facility], contact in the a.m.   Neuropathy secondary to chemotherapy:  -Continue Neurontin 900 mg BID  Incidental finding of a right middle lobe 2 cm nodule:  -Bronchogenic neoplasm  vs metastasis Will need outpatient follow-up.   Code Status: FULL Family Communication: no family present at time of exam Disposition Plan: resolution Sepsis    Consultants:   Procedure/Significant Events: 5/6 CT angiogram chest, abdomen and pelvis at [facility].;  -despite IVC filter acute multisegment PE to RUL -Best explanation for low abdominal pain is cystitis secondary to infection vs XRT -2 cm  RML, favor bronchogenic neoplasm over solitary metastases; prominent right hilar/subcarinal nodes 5/7 lower extremity Doppler; Positive for DVT in the bilateral common femoral, profunda, femoral veins. Positive for DVT in the right  popliteal veins  Culture 5/6 MRSA by PCR negative 5/6 blood right/left hand NGTD 5/6 urine negative   Antibiotics: Zosyn 5/6>> Vancomycin 5/6>>   DVT prophylaxis: Heparin drip   Devices    LINES / TUBES:      Continuous Infusions: . sodium chloride 125 mL/hr at 09/10/14 0300  . heparin 1,050 Units/hr (09/10/14 0806)    Objective: VITAL SIGNS: Temp: 98.1 F (36.7 C) (05/08 1652) Temp Source: Oral (05/08 1652) BP: 123/79 mmHg (05/08 0700) Pulse Rate: 89 (05/08 0700) SPO2; FIO2:   Intake/Output Summary (Last 24 hours) at 09/10/14 1821 Last data filed at 09/10/14 1133  Gross per 24 hour  Intake 1188.75 ml  Output   1800 ml  Net -611.25 ml     Exam: General: A/O 4, NAD, No acute respiratory distress Lungs: Clear to auscultation bilaterally without wheezes or crackles Cardiovascular: Tachycardic, Regular rhythm without murmur gallop or rub normal S1 and S2 Abdomen: Nontender, nondistended, soft, bowel sounds positive, no rebound, no ascites, no appreciable mass Extremities: No significant cyanosis, clubbing, or edema bilateral lower extremities  Data Reviewed: Basic Metabolic Panel:  Recent Labs Lab 09/09/14 0455 09/10/14 0305  NA 134* 137  K 3.8 3.7  CL 106 106  CO2 20* 23  GLUCOSE 178* 131*  BUN 14 10  CREATININE 0.99 0.84  CALCIUM 8.2* 8.6*   Liver Function Tests:  Recent Labs Lab 09/09/14 0455 09/10/14 0305  AST 17 16  ALT 15* 15*  ALKPHOS 56 60  BILITOT 1.4* 1.1  PROT 5.9* 5.3*  ALBUMIN 3.1* 3.0*   No results for input(s): LIPASE, AMYLASE in the last 168 hours. No results for input(s): AMMONIA in the last 168 hours. CBC:  Recent Labs Lab 09/09/14 0455 09/10/14 0305  WBC 10.0 9.1  NEUTROABS  --  5.3  HGB 13.2 12.7*  HCT 38.5* 36.8*  MCV 87.7 87.6  PLT 179 169   Cardiac Enzymes: No results for input(s): CKTOTAL, CKMB, CKMBINDEX, TROPONINI in the last 168 hours. BNP (last 3 results) No results for input(s): BNP in  the last 8760 hours.  ProBNP (last 3 results) No results for input(s): PROBNP in the last 8760 hours.  CBG: No results for input(s): GLUCAP in the last 168 hours.  Recent Results (from the past 240 hour(s))  MRSA PCR Screening     Status: None   Collection Time: 09/08/14  1:46 PM  Result Value Ref Range Status   MRSA by PCR NEGATIVE NEGATIVE Final    Comment:        The GeneXpert MRSA Assay (FDA approved for NASAL specimens only), is one component of a comprehensive MRSA colonization surveillance program. It is not intended to diagnose MRSA infection nor to guide or monitor treatment for MRSA infections.   Culture, blood (x 2)     Status: None (Preliminary result)   Collection Time: 09/08/14  3:00 PM  Result Value Ref Range Status   Specimen Description BLOOD RIGHT HAND  Final   Special Requests BOTTLES DRAWN AEROBIC ONLY 5CC  Final   Culture   Final           BLOOD CULTURE RECEIVED NO GROWTH TO DATE CULTURE WILL BE HELD FOR 5 DAYS BEFORE ISSUING A FINAL NEGATIVE REPORT Performed at [facility]    Report Status  PENDING  Incomplete  Culture, blood (x 2)     Status: None (Preliminary result)   Collection Time: 09/08/14  3:10 PM  Result Value Ref Range Status   Specimen Description BLOOD LEFT HAND  Final   Special Requests BOTTLES DRAWN AEROBIC ONLY 2CC  Final   Culture   Final           BLOOD CULTURE RECEIVED NO GROWTH TO DATE CULTURE WILL BE HELD FOR 5 DAYS BEFORE ISSUING A FINAL NEGATIVE REPORT Note: Culture results may be compromised due to an inadequate volume of blood received in culture bottles. Performed at [facility]    Report Status PENDING  Incomplete  Urine culture     Status: None   Collection Time: 09/08/14  5:08 PM  Result Value Ref Range Status   Specimen Description URINE, CATHETERIZED  Final   Special Requests NONE  Final   Colony Count NO GROWTH Performed at [facility]   Final   Culture NO GROWTH Performed at [facility]   Final   Report Status 09/09/2014 FINAL  Final     Studies:  Recent x-ray studies have been reviewed in detail by the Attending Physician  Scheduled Meds:  Scheduled Meds: . gabapentin  900 mg Oral BID  . pantoprazole  40 mg Oral Q1200  . piperacillin-tazobactam (ZOSYN)  IV  3.375 g Intravenous Q8H  . sodium chloride  3 mL Intravenous Q12H  . tamsulosin  0.4 mg Oral Daily  . vancomycin  750 mg Intravenous Q12H    Time spent on care of this patient: 40 mins   Danaysia Rader, Geraldo Docker , MD  [facility] Office  [phone] Pager [phone]  On-Call/Text Page:      [email or web address]      password [email or web address]  If 7PM-7AM, please contact night-coverage [email or web address] Password [email or web address] 09/10/2014, 6:21 PM   LOS: 2 days   Care during the described time interval was provided by me .  I have reviewed this patient's available data, including medical history, events of note, physical examination, radiology studies and test results as part of my evaluation  Dia Crawford, MD [phone] Pager

## 2014-09-10 NOTE — Progress Notes (Signed)
ANTICOAGULATION CONSULT NOTE - Follow Up Consult  Pharmacy Consult for Heparin Indication: PE / DVT  Allergies  Allergen Reactions  . Trazodone And Nefazodone Other (See Comments)    hallucinations    Patient Measurements: Height: '6\' 2"'$  (188 cm) Weight: 223 lb 15.8 oz (101.6 kg) IBW/kg (Calculated) : 82.2  Vital Signs: Temp: 98.2 F (36.8 C) (05/08 0310) Temp Source: Oral (05/08 0310) BP: 124/72 mmHg (05/08 0310) Pulse Rate: 90 (05/08 0310)  Labs:  Recent Labs  09/08/14 1510  09/09/14 0455 09/09/14 0905 09/09/14 1830 09/10/14 0305  HGB  --   --  13.2  --   --  12.7*  HCT  --   --  38.5*  --   --  36.8*  PLT  --   --  179  --   --  169  APTT 42*  --   --   --   --   --   LABPROT 14.9  --   --   --   --   --   INR 1.15  --   --   --   --   --   HEPARINUNFRC  --   < >  --  0.86* 0.60 0.38  CREATININE  --   --  0.99  --   --  0.84  < > = values in this interval not displayed.  Estimated Creatinine Clearance: 114.6 mL/min (by C-G formula based on Cr of 0.84).   Assessment: 64 year old male continues on heparin for PE. Initial heparin level elevated at 1.24 likely due to Lovenox given on 5/6. HL this am was therapeutic at 0.38 but decreased from 0.6 last night. CBC stable, no issues noted.  Follow-up HL is therapeutic at 0.6 on heparin 1000 units/hr. No issues with infusion or bleeding noted.  Goal of Therapy:  Heparin level 0.3-0.7 units/ml Monitor platelets by anticoagulation protocol: Yes   Plan:  Increase heparin gtt slightly to 1050 units/hr Monitor daily HL, CBC, s/s of bleed F/U long term anticoagulation plan  Seynabou Fults J 09/10/2014,7:35 AM

## 2014-09-10 NOTE — Evaluation (Signed)
Physical Therapy Evaluation Patient Details Name: Charles [name] MRN: [id number] DOB: 1950-09-12 Today's Date: 09/10/2014   History of Present Illness  Adm from [facility] with PE; found to have multiple DVTs bil LE's PMHx- colon Ca with bil foot neuropathy due to chemo; DVT x2 since chemo  Clinical Impression  Patient evaluated by Physical Therapy with no further acute PT needs identified. All education has been completed and the patient has no further questions. Despite history of bil lower extremity neuropathies, he demonstrates excellent balance.  PT is signing off. Thank you for this referral.     Follow Up Recommendations No PT follow up    Equipment Recommendations  None recommended by PT    Recommendations for Other Services       Precautions / Restrictions Precautions Precautions: None      Mobility  Bed Mobility Overal bed mobility: Modified Independent                Transfers Overall transfer level: Independent Equipment used: None             General transfer comment: from bed, recliner, and std toilet  Ambulation/Gait Ambulation/Gait assistance: Supervision Ambulation Distance (Feet): 150 Feet Assistive device:  (pt pushing IV pole) Gait Pattern/deviations: WFL(Within Functional Limits)   Gait velocity interpretation: at or above normal speed for age/gender General Gait Details: steady without difficulty; HR elevated limiting  Stairs            Wheelchair Mobility    Modified Rankin (Stroke Patients Only)       Balance Overall balance assessment: Independent                                           Pertinent Vitals/Pain HR 99-125 with activity; vc for slowing pace SaO2 on room air 91-98% throughout   Pain Assessment: No/denies pain    Home Living Family/patient expects to be discharged to:: Private residence Living Arrangements: Spouse/significant other Available Help at Discharge:  Family;Available 24 hours/day Type of Home: House Home Access: Stairs to enter Entrance Stairs-Rails: Can reach both Entrance Stairs-Number of Steps: 5 Home Layout: One level Home Equipment: Grab bars - toilet;Shower seat - built in (wife owns/uses cane, RW)      Prior Function Level of Independence: Independent               Hand Dominance        Extremity/Trunk Assessment   Upper Extremity Assessment: Overall WFL for tasks assessed           Lower Extremity Assessment: RLE deficits/detail;LLE deficits/detail         Communication   Communication: No difficulties  Cognition                            General Comments      Exercises        Assessment/Plan    PT Assessment Patent does not need any further PT services  PT Diagnosis Generalized weakness   PT Problem List    PT Treatment Interventions     PT Goals (Current goals can be found in the Care Plan section) Acute Rehab PT Goals PT Goal Formulation: All assessment and education complete, DC therapy    Frequency     Barriers to discharge        Co-evaluation  End of Session   Activity Tolerance: Patient tolerated treatment well Patient left: with call bell/phone within reach;with family/visitor present (in bathroom with pull cord; RN made aware) Nurse Communication: Mobility status         Time: 6834-1962 PT Time Calculation (min) (ACUTE ONLY): 29 min   Charges:   PT Evaluation $Initial PT Evaluation Tier I: 1 Procedure PT Treatments $Gait Training: 8-22 mins   PT G Codes:        Charles [name] 09-24-2014, 2:14 PM  Pager [phone]

## 2014-09-11 ENCOUNTER — Encounter (HOSPITAL_COMMUNITY): Payer: Self-pay | Admitting: Radiology

## 2014-09-11 ENCOUNTER — Inpatient Hospital Stay (HOSPITAL_COMMUNITY): Payer: Medicare Other

## 2014-09-11 ENCOUNTER — Ambulatory Visit: Payer: Medicare Other | Admitting: Gastroenterology

## 2014-09-11 DIAGNOSIS — I82409 Acute embolism and thrombosis of unspecified deep veins of unspecified lower extremity: Secondary | ICD-10-CM | POA: Insufficient documentation

## 2014-09-11 DIAGNOSIS — R911 Solitary pulmonary nodule: Secondary | ICD-10-CM | POA: Insufficient documentation

## 2014-09-11 DIAGNOSIS — I82403 Acute embolism and thrombosis of unspecified deep veins of lower extremity, bilateral: Secondary | ICD-10-CM

## 2014-09-11 LAB — CBC
HCT: 36.6 % — ABNORMAL LOW (ref 39.0–52.0)
Hemoglobin: 12.2 g/dL — ABNORMAL LOW (ref 13.0–17.0)
MCH: 29.3 pg (ref 26.0–34.0)
MCHC: 33.3 g/dL (ref 30.0–36.0)
MCV: 87.8 fL (ref 78.0–100.0)
Platelets: 150 10*3/uL (ref 150–400)
RBC: 4.17 MIL/uL — ABNORMAL LOW (ref 4.22–5.81)
RDW: 12.8 % (ref 11.5–15.5)
WBC: 8.8 10*3/uL (ref 4.0–10.5)

## 2014-09-11 LAB — CBC WITH DIFFERENTIAL/PLATELET
Basophils Absolute: 0 10*3/uL (ref 0.0–0.1)
Basophils Relative: 0 % (ref 0–1)
Eosinophils Absolute: 0.2 10*3/uL (ref 0.0–0.7)
Eosinophils Relative: 3 % (ref 0–5)
HCT: 36.3 % — ABNORMAL LOW (ref 39.0–52.0)
Hemoglobin: 12.3 g/dL — ABNORMAL LOW (ref 13.0–17.0)
Lymphocytes Relative: 29 % (ref 12–46)
Lymphs Abs: 2.2 10*3/uL (ref 0.7–4.0)
MCH: 29.8 pg (ref 26.0–34.0)
MCHC: 33.9 g/dL (ref 30.0–36.0)
MCV: 87.9 fL (ref 78.0–100.0)
Monocytes Absolute: 0.6 10*3/uL (ref 0.1–1.0)
Monocytes Relative: 8 % (ref 3–12)
Neutro Abs: 4.6 10*3/uL (ref 1.7–7.7)
Neutrophils Relative %: 60 % (ref 43–77)
Platelets: 148 10*3/uL — ABNORMAL LOW (ref 150–400)
RBC: 4.13 MIL/uL — ABNORMAL LOW (ref 4.22–5.81)
RDW: 12.7 % (ref 11.5–15.5)
WBC: 7.6 10*3/uL (ref 4.0–10.5)

## 2014-09-11 LAB — PROTIME-INR
INR: 1.07 (ref 0.00–1.49)
Prothrombin Time: 14 seconds (ref 11.6–15.2)

## 2014-09-11 LAB — FIBRINOGEN: Fibrinogen: 423 mg/dL (ref 204–475)

## 2014-09-11 LAB — COMPREHENSIVE METABOLIC PANEL
ALT: 22 U/L (ref 17–63)
AST: 25 U/L (ref 15–41)
Albumin: 2.9 g/dL — ABNORMAL LOW (ref 3.5–5.0)
Alkaline Phosphatase: 56 U/L (ref 38–126)
Anion gap: 7 (ref 5–15)
BUN: 7 mg/dL (ref 6–20)
CO2: 24 mmol/L (ref 22–32)
Calcium: 8.4 mg/dL — ABNORMAL LOW (ref 8.9–10.3)
Chloride: 109 mmol/L (ref 101–111)
Creatinine, Ser: 0.79 mg/dL (ref 0.61–1.24)
GFR calc Af Amer: >60 mL/min (ref >60)
GFR calc non Af Amer: >60 mL/min (ref >60)
Glucose, Bld: 119 mg/dL — ABNORMAL HIGH (ref 70–99)
Potassium: 3.4 mmol/L — ABNORMAL LOW (ref 3.5–5.1)
Sodium: 140 mmol/L (ref 135–145)
Total Bilirubin: 1.3 mg/dL — ABNORMAL HIGH (ref 0.3–1.2)
Total Protein: 5.3 g/dL — ABNORMAL LOW (ref 6.5–8.1)

## 2014-09-11 LAB — APTT: aPTT: 86 s — ABNORMAL HIGH (ref 24–37)

## 2014-09-11 LAB — HEPARIN LEVEL (UNFRACTIONATED)
Heparin Unfractionated: 0.23 [IU]/mL — ABNORMAL LOW (ref 0.30–0.70)
Heparin Unfractionated: 0.32 IU/mL (ref 0.30–0.70)
Heparin Unfractionated: <0.1 IU/mL — ABNORMAL LOW (ref 0.30–0.70)

## 2014-09-11 LAB — GLUCOSE, CAPILLARY: Glucose-Capillary: 84 mg/dL (ref 70–99)

## 2014-09-11 LAB — TROPONIN I: Troponin I: <0.03 ng/mL (ref <0.031)

## 2014-09-11 MED ORDER — MIDAZOLAM HCL 2 MG/2ML IJ SOLN
INTRAMUSCULAR | Status: AC | PRN
  Administered 2014-09-11: 1 mg via INTRAVENOUS

## 2014-09-11 MED ORDER — TENECTEPLASE 50 MG IV KIT
0.5000 mg/h | PACK | INTRAVENOUS | Status: DC
  Administered 2014-09-11 – 2014-09-12 (×5): 0.5 mg/h
  Filled 2014-09-11 (×5): qty 0.5

## 2014-09-11 MED ORDER — OXYCODONE HCL 5 MG PO TABS: 5.0000 mg | ORAL_TABLET | ORAL | Status: DC | PRN

## 2014-09-11 MED ORDER — ALPRAZOLAM 0.5 MG PO TABS
0.5000 mg | ORAL_TABLET | Freq: Three times a day (TID) | ORAL | Status: DC | PRN
  Administered 2014-09-11 – 2014-09-14 (×6): 0.5 mg via ORAL
  Filled 2014-09-11 (×6): qty 1

## 2014-09-11 MED ORDER — SODIUM CHLORIDE 0.9 % IJ SOLN: 3.0000 mL | INTRAMUSCULAR | Status: DC | PRN

## 2014-09-11 MED ORDER — LIDOCAINE HCL 1 % IJ SOLN
INTRAMUSCULAR | Status: AC
  Filled 2014-09-11: qty 20

## 2014-09-11 MED ORDER — POTASSIUM CHLORIDE CRYS ER 20 MEQ PO TBCR
40.0000 meq | EXTENDED_RELEASE_TABLET | Freq: Once | ORAL | Status: AC
  Administered 2014-09-11: 40 meq via ORAL
  Filled 2014-09-11: qty 2

## 2014-09-11 MED ORDER — FENTANYL CITRATE (PF) 100 MCG/2ML IJ SOLN
INTRAMUSCULAR | Status: AC | PRN
Start: 2014-09-11 — End: 2014-09-11
  Administered 2014-09-11: 50 ug via INTRAVENOUS

## 2014-09-11 MED ORDER — HEPARIN BOLUS VIA INFUSION
1000.0000 [IU] | INTRAVENOUS | Status: AC
  Administered 2014-09-11: 1000 [IU] via INTRAVENOUS
  Filled 2014-09-11: qty 1000

## 2014-09-11 MED ORDER — SODIUM CHLORIDE 0.9 % IJ SOLN
3.0000 mL | Freq: Two times a day (BID) | INTRAMUSCULAR | Status: DC
  Administered 2014-09-11 – 2014-09-13 (×3): 3 mL via INTRAVENOUS

## 2014-09-11 MED ORDER — ONDANSETRON HCL 4 MG/2ML IJ SOLN: 4.0000 mg | Freq: Four times a day (QID) | INTRAMUSCULAR | Status: DC | PRN

## 2014-09-11 MED ORDER — HEPARIN (PORCINE) IN NACL 100-0.45 UNIT/ML-% IJ SOLN
1300.0000 [IU]/h | INTRAMUSCULAR | Status: DC
  Administered 2014-09-11: 1250 [IU]/h via INTRAVENOUS
  Administered 2014-09-12 – 2014-09-13 (×2): 1300 [IU]/h via INTRAVENOUS
  Filled 2014-09-11 (×5): qty 250

## 2014-09-11 MED ORDER — HEPARIN (PORCINE) IN NACL 100-0.45 UNIT/ML-% IJ SOLN: 800.0000 [IU]/h | INTRAMUSCULAR | Status: DC

## 2014-09-11 MED ORDER — SODIUM CHLORIDE 0.9 % IV SOLN: 250.0000 mL | INTRAVENOUS | Status: DC | PRN

## 2014-09-11 MED ORDER — FENTANYL CITRATE (PF) 100 MCG/2ML IJ SOLN
INTRAMUSCULAR | Status: AC
  Filled 2014-09-11: qty 2

## 2014-09-11 MED ORDER — MIDAZOLAM HCL 2 MG/2ML IJ SOLN
INTRAMUSCULAR | Status: AC
  Filled 2014-09-11: qty 2

## 2014-09-11 MED ORDER — MIDAZOLAM HCL 2 MG/2ML IJ SOLN: 1.0000 mg | INTRAMUSCULAR | Status: DC | PRN

## 2014-09-11 MED ORDER — MORPHINE SULFATE 4 MG/ML IJ SOLN: 5.0000 mg | INTRAMUSCULAR | Status: DC | PRN

## 2014-09-11 MED ORDER — IODIXANOL 320 MG/ML IV SOLN
100.0000 mL | Freq: Once | INTRAVENOUS | Status: AC | PRN
  Administered 2014-09-11: 100 mL via INTRAVENOUS

## 2014-09-11 MED ORDER — PANTOPRAZOLE SODIUM 40 MG PO TBEC
40.0000 mg | DELAYED_RELEASE_TABLET | Freq: Every day | ORAL | Status: DC
  Administered 2014-09-12 – 2014-09-14 (×3): 40 mg via ORAL
  Filled 2014-09-11 (×3): qty 1

## 2014-09-11 NOTE — Sedation Documentation (Signed)
Pt tolerated procedure well, c/o of some pain d/t being uncomfortable on IR Table

## 2014-09-11 NOTE — Sedation Documentation (Signed)
Patient is resting comfortably. 

## 2014-09-11 NOTE — Consult Note (Signed)
PULMONARY / CRITICAL CARE MEDICINE   Name: Charles [name] MRN: [id number] DOB: 09/27/1950    ADMISSION DATE:  09/08/2014 CONSULTATION DATE:  09/11/2014  REFERRING MD :  Thereasa Solo  CHIEF COMPLAINT: PE with DVT post catheter thrombolysis  INITIAL PRESENTATION:  64 y.o. M rectal cancer transferred from [facility] with PE.  Found to have bilateral DVT's (associated filter clot) as well.  Underwent thrombolysis 09/11/14, transferred to ICU post IR procedure for close monitoring.   STUDIES:  CTA chest (from [facility]) >>> PE, R lung mass LE duplex 5/7 >>> DVT in b/l common femoral, profunda, femoral veins.  DVT in right popliteal vein. CT head 5/9 >>> no acute intracranial findings.  SIGNIFICANT EVENTS: 5/6 - transferred from [facility] with PE 5/9 - catheter lysis to BLE DVT's.   HISTORY OF PRESENT ILLNESS: Charles [name] is a 64 y.o. M with PMH as outlined below.  He had rectal CA s/p surgery at [facility] in 2012 followed by chemotherapy which he completed in January 2013.  He had a DVT around that time with IVC filter placed and completed anticoagulation (coumadin) in January 2013. Later that year, he had a 2nd DVT while hopitalized for an attempted IVC filter removal (which was unsuccessful).  He was in his USOH until 09/08/14 when he woke up feeling diaphoretic, dizzy, and almost passed out.  He was taken to [facility] where at CT of the chest revealed an acute PE.  He was subsequently transferred to [facility] for further management.  On 09/11/14, he was evaluated by IR for bilateral DVT's, PE, and what appeared to be a clotted IVC filter.  He was taken to the IR suite and had BLE DVT thrombolysis performed.  Procedure was successful and pt was then transferred to ICU for close observation.  Of note, CT of the chest also revealed incidental finding of a RML 2cm lung nodule and lymphadenopathy, worrisome for bronchogenic mets.  IR is considering a bx of R lung mass in the near  future.  PAST MEDICAL HISTORY :   has a past medical history of HTN (hypertension); Pulmonary embolism; GERD (gastroesophageal reflux disease); Depression (04/01/2011); DVT (deep venous thrombosis) (05/09/2011); High output ileostomy (05/21/2011); Colon cancer (07/24/10); radiation therapy (09/02/10 to 10/14/10); and Rectal cancer (01/15/2011).  has past surgical history that includes Esophagogastroduodenoscopy (07/2010); Colonoscopy (07/2010); EUS (08/2010); IVC filter; Colon surgery (11/14/2010); port a cath placement; colostomy reversal (april 2013); Port-a-cath removal (09/24/2011); ivc filter; Colonoscopy (04/21/2012); and Colonoscopy (N/A, 11/24/2013). Prior to Admission medications   Medication Sig Start Date End Date Taking? Authorizing Provider  ALPHA LIPOIC ACID PO Take 200 mg by mouth 2 (two) times daily.   Yes Historical Provider, MD  clotrimazole-betamethasone (LOTRISONE) cream Apply 1 application topically 2 (two) times daily. 08/01/14  Yes Mahala Menghini, PA-C  gabapentin (NEURONTIN) 300 MG capsule Take 3 in AM, 2 in afternoon, and 3 at bedtime. Patient taking differently: 900 mg 2 (two) times daily.  05/18/14  Yes Manon Hilding Kefalas, PA-C  hydrocortisone (ANUSOL-HC) 2.5 % rectal cream Place 1 application rectally 2 (two) times daily. Patient taking differently: Place 1 application rectally daily as needed for hemorrhoids.  08/02/14  Yes Mahala Menghini, PA-C  ibuprofen (ADVIL,MOTRIN) 200 MG tablet Take 400 mg by mouth at bedtime as needed for pain.   Yes Historical Provider, MD  omeprazole (PRILOSEC OTC) 20 MG tablet Take 20 mg by mouth daily.   Yes Historical Provider, MD  Psyllium (METAMUCIL PO) Take 15 mLs  by mouth daily.    Yes Historical Provider, MD  traZODone (DESYREL) 50 MG tablet Take 50 mg by mouth at bedtime.   Yes Historical Provider, MD  vitamin B-12 (CYANOCOBALAMIN) 1000 MCG tablet Take 1,000 mcg by mouth daily.   Yes Historical Provider, MD  vitamin E 400 UNIT capsule Take 400 Units by  mouth daily.   Yes Historical Provider, MD  diphenhydramine-acetaminophen (TYLENOL PM) 25-500 MG TABS Take 1 tablet by mouth at bedtime as needed (sleep).     Historical Provider, MD   Allergies  Allergen Reactions  . Trazodone And Nefazodone Other (See Comments)    hallucinations    FAMILY HISTORY:  Family History  Problem Relation Age of Onset  . Colon cancer Neg Hx   . Liver disease Neg Hx   . Inflammatory bowel disease Neg Hx   . Cancer Brother     throat  . Cancer Brother     prostate    SOCIAL HISTORY:  reports that he has never smoked. He has never used smokeless tobacco. He reports that he does not drink alcohol or use illicit drugs.  REVIEW OF SYSTEMS:  All negative; except for those that are bolded, which indicate positives.  Constitutional: weight loss, weight gain, night sweats, fevers, chills, fatigue, weakness.  HEENT: headaches, sore throat, sneezing, nasal congestion, post nasal drip, difficulty swallowing, tooth/dental problems, visual complaints, visual changes, ear aches. Neuro: difficulty with speech, weakness, numbness, ataxia. CV:  chest pain, orthopnea, PND, swelling in lower extremities, dizziness, palpitations, syncope.  Resp: cough, hemoptysis, dyspnea, wheezing. GI  heartburn, indigestion, abdominal pain, nausea, vomiting, diarrhea, constipation, change in bowel habits, loss of appetite, hematemesis, melena, hematochezia.  GU: dysuria, change in color of urine, urgency or frequency, flank pain, hematuria. MSK: joint pain or swelling, decreased range of motion. Psych: change in mood or affect, depression, anxiety, suicidal ideations, homicidal ideations. Skin: rash, itching, bruising.   SUBJECTIVE:  Denies chest pain, SOB, lightheadedness.  In good spirits post IR procedure  VITAL SIGNS: Temp:  [98.3 F (36.8 C)-99.7 F (37.6 C)] 99.1 F (37.3 C) (05/09 1154) Pulse Rate:  [82-112] 83 (05/09 1611) Resp:  [14-26] 16 (05/09 1611) BP:  (128-151)/(67-80) 142/72 mmHg (05/09 1611) SpO2:  [93 %-100 %] 99 % (05/09 1611) HEMODYNAMICS:   VENTILATOR SETTINGS:   INTAKE / OUTPUT: Intake/Output      05/08 0701 - 05/09 0700 05/09 0701 - 05/10 0700   P.O. 240 480   I.V. (mL/kg) 3619 (35.6) 425 (4.2)   Total Intake(mL/kg) 3859 (38) 905 (8.9)   Urine (mL/kg/hr) 2500 (1)    Emesis/NG output 0 (0)    Stool 0 (0)    Total Output 2500     Net +1359 +905        Stool Occurrence 2 x 2 x   Emesis Occurrence 1 x 2 x     PHYSICAL EXAMINATION: General: WDWN male, in NAD. Neuro: A&O x 3, non-focal.  HEENT: [place]/AT. PERRL, sclerae anicteric. Cardiovascular: RRR, no M/R/G. , no RV heave Lungs: Respirations even and unlabored.  CTA bilaterally, No W/R/R.  Abdomen: BS x 4, soft, NT/ND.  Musculoskeletal: No gross deformities, edema R > L.  IV thrombolysis catheters to BLE's. Skin: Intact, warm, no rashes.  LABS:  CBC  Recent Labs Lab 09/09/14 0455 09/10/14 0305 09/11/14 0235  WBC 10.0 9.1 7.6  HGB 13.2 12.7* 12.3*  HCT 38.5* 36.8* 36.3*  PLT 179 169 148*   Coag's  Recent Labs Lab 09/08/14 1510  09/11/14 1040  APTT 42* 86*  INR 1.15 1.07   BMET  Recent Labs Lab 09/09/14 0455 09/10/14 0305 09/11/14 0235  NA 134* 137 140  K 3.8 3.7 3.4*  CL 106 106 109  CO2 20* 23 24  BUN '14 10 7  '$ CREATININE 0.99 0.84 0.79  GLUCOSE 178* 131* 119*   Electrolytes  Recent Labs Lab 09/09/14 0455 09/10/14 0305 09/11/14 0235  CALCIUM 8.2* 8.6* 8.4*   Sepsis Markers  Recent Labs Lab 09/08/14 1510  09/09/14 1425 09/09/14 1830 09/10/14 0305  LATICACIDVEN 4.0*  < > 2.1* 2.5* 1.3  PROCALCITON <0.10  --   --   --   --   < > = values in this interval not displayed. ABG No results for input(s): PHART, PCO2ART, PO2ART in the last 168 hours. Liver Enzymes  Recent Labs Lab 09/09/14 0455 09/10/14 0305 09/11/14 0235  AST '17 16 25  '$ ALT 15* 15* 22  ALKPHOS 56 60 56  BILITOT 1.4* 1.1 1.3*  ALBUMIN 3.1* 3.0* 2.9*    Cardiac Enzymes No results for input(s): TROPONINI, PROBNP in the last 168 hours. Glucose No results for input(s): GLUCAP in the last 168 hours.  Imaging Ct Head Wo Contrast  09/11/2014   CLINICAL DATA:  64 y.o. M Hx rectal cancer status post surgery at [facility] in 2012 finishing chemotherapy in January 2013, DVT off of anticoagulation since 2013, and chronic neuropathy secondary to chemotherapy who woke up feeling diaphoretic and dizzy and almost passed out. He was taken to the [facility] department [facility] revealed an acute pulmonary embolism. He also had urinary retention and a Foley catheter had to be placed. Patient was subsequently transferred to [facility] for further management.  EXAM: CT HEAD WITHOUT CONTRAST  TECHNIQUE: Contiguous axial images were obtained from the base of the skull through the vertex without intravenous contrast.  COMPARISON:  None.  FINDINGS: Ventricles are normal in size and configuration. There are no parenchymal masses or mass effect. There is no evidence of a cortical infarct. There are subtle areas of white matter hypoattenuation consistent with chronic microvascular ischemic change.  There are no extra-axial masses or abnormal fluid collections.  No intracranial hemorrhage.  Visualized sinuses and mastoid air cells are clear. No skull lesion.  IMPRESSION: 1. No acute intracranial abnormalities. 2. Minimal chronic microvascular ischemic change. 3. No evidence of neoplastic/metastatic disease.   Electronically Signed   By: Lajean Manes M.D.   On: 09/11/2014 14:53     ASSESSMENT / PLAN:  HEMATOLOGIC A:   Bilateral LE DVT's - s/p IR thrombolysis 5/9 Concern for occluded IVC filter Mild thrombocytopenia Hx stage III colorectal CA - s/p chemo and XRT, followed by oncology at [place]:  Post op care / lysis management per IR. Frequent CBC's. Continue Heparin gtt. Will likely require lifelong anticoagulation given that this is pt's 3rd VTE in  setting cancer Follow platelet counts. Follow up with oncology post discharge.  PULMONARY A: Acute PE small New / incidental finding of RML 2cm lung nodule with lymphadenopathy - concerning for bronchogenic mets P:   Continue heparin gtt. IR planning for lung bx in near future. Pulmonary hygiene.  CARDIOVASCULAR A:  R/o any heart strain due to acute PE - UNLIKELY given such SMALL PE P:  Repeat troponin. Echo pending.  RENAL A:   Hypokalemia (3.4) Pseudohypocalcemia - corrects to 9.28 P:   Potassium 10mq PO. Send ionized calcium. BMP in AM.  GASTROINTESTINAL A:  GERD Nutrition P:   Pantoprazole. Clear liquids.  INFECTIOUS A:   No indication of infection P:   Monitor clinically.  ENDOCRINE A:   No known issues P:   Monitor glucose on BMP.  NEUROLOGIC A:   Anxiety Neuropathy secondary to chemotherapy Hx depression P:   Alprazolam PRN. Continue outpatient gabapentin. Hold outpatient trazodone.   Family updated: Wife at bedside.  Interdisciplinary Family Meeting v Palliative Care Meeting:  Due by: 09/17/14.   Montey Hora, [place] [facility] Pager: [phone]  or [phone] 09/11/2014, 5:33 PM   STAFF NOTE: I, Merrie Roof, MD FACP have personally reviewed patient's available data, including medical history, events of note, physical examination and test results as part of my evaluation. I have discussed with resident/NP and other care providers such as pharmacist, RN and RRT. In addition, I personally evaluated patient and elicited key findings of: NO distress, CTA, reports NO chest pain or SOB, the CT is unimpressive with small small PE, Echo unlikely to show RV strain, would wan to see PA pressures however, follow neurostatus closely s/p lytics, follow cbc, heparin per pharmacy, to remain in icu until catheters taken out   Lavon Paganini. Titus Mould, MD, [place] Pgr: [place] [facility] 09/11/2014 9:46 PM

## 2014-09-11 NOTE — Progress Notes (Addendum)
ANTICOAGULATION CONSULT NOTE - Follow Up Consult  Pharmacy Consult for Heparin Indication: PE / DVT  Allergies  Allergen Reactions  . Trazodone And Nefazodone Other (See Comments)    hallucinations    Patient Measurements: Height: '6\' 2"'$  (188 cm) Weight: 223 lb 15.8 oz (101.6 kg) IBW/kg (Calculated) : 82.2  Vital Signs: Temp: 99.5 F (37.5 C) (05/09 0235) Temp Source: Oral (05/09 0235) BP: 132/70 mmHg (05/09 0235) Pulse Rate: 97 (05/09 0235)  Labs:  Recent Labs  09/08/14 1510  09/09/14 0455  09/09/14 1830 09/10/14 0305 09/11/14 0235  HGB  --   < > 13.2  --   --  12.7* 12.3*  HCT  --   --  38.5*  --   --  36.8* 36.3*  PLT  --   --  179  --   --  169 148*  APTT 42*  --   --   --   --   --   --   LABPROT 14.9  --   --   --   --   --   --   INR 1.15  --   --   --   --   --   --   HEPARINUNFRC  --   < >  --   < > 0.60 0.38 0.23*  CREATININE  --   --  0.99  --   --  0.84  --   < > = values in this interval not displayed.  Estimated Creatinine Clearance: 114.6 mL/min (by C-G formula based on Cr of 0.84).   Assessment: 64 year old male continues on heparin for  PE. Heparin level is 0.23 on heparin 1050 units/hr. H/H stable, pltc decreased to 148K from 179K.  No issues with infusion or bleeding noted. This is patient's third DVT/PE.  Has IVC Flilter which has been in place since 2012 according to the patient.  PTA he had been off of anticoagulation since 2013.  Goal of Therapy:  Heparin level 0.3-0.7 units/ml Monitor platelets by anticoagulation protocol: Yes   Plan:  Give heparin 1000 bolus x1  Increase heparin drip to 1250 units/hr F/u 6 hr heparin level Monitor daily HL, CBC, s/s of bleed F/U long term anticoagulation plan  Nicole Cella, RPh Clinical Pharmacist Pager: [phone] 09/11/2014,4:03 AM

## 2014-09-11 NOTE — Consult Note (Signed)
Chief Complaint: B DVT/ PE Clotted IVC filter R lung mass  Referring Physician(s): Dr Thereasa Solo   History of Present Illness: Charles [name] is a 64 y.o. male   Pt with known Hx rectal cancer 2012 Has had previous DVT/PE x 3 IVC filter was placed 2012 Attempt to remove 2013 but unsuccessful Presents  to ED at [facility]  09/08/14 with diaphoresis; dizzy; not feeling well Almost passed out at home Work up reveals doppler with B DVT and CT shows Pulm embolus with what appears to be clotted IVC filter Started on Hep and transferred to [facility] for evaluation Doppler at [facility] does confirm extensive B LE DVT CT also shows incidental R lung mass and lymphadenopathy Worrisome for bronchogenic metastasis Request has been made for IR to evaluate pt for B LE DVT thrombolysis and possible Rt lung mass biopsy Dr Barbie Banner has discussed with Dr Thereasa Solo He has reviewed imaging and approves both procedures Must begin with lysis procedure then lung biopsy some time after completion of same  Pt does have B leg swelling; tightness; sl pain No SOB Breathing easily   Past Medical History  Diagnosis Date  . HTN (hypertension)   . Pulmonary embolism   . GERD (gastroesophageal reflux disease)   . Depression 04/01/2011  . DVT (deep venous thrombosis) 05/09/2011  . 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 Colon cancer 07/24/10    rectal ca, inv adenocarcinoma  . Hx of radiation therapy 09/02/10 to 10/14/10    pelvis  . Rectal cancer 01/15/2011    S/P radiation and concurrent 5-FU continuous infusion from 09/09/10- 10/10/10.  S/P proctectomy with colorectal anastomosis and diverting loop ileostomy on 11/14/10 at [facility] by Dr. Harlon Ditty. Pathology reveals a pT3b N1 with 3/20 lymph nodes.      Past Surgical History  Procedure Laterality Date  . Esophagogastroduodenoscopy  07/2010    RMR: schatki ring s/p dilation, small hh, SB bx benign  . Colonoscopy  07/2010    proximal rectal apple core mass 10-14cm  from anal verge (adenocarcinoma), 2-3cm distal rectal carpet polyp s/p piecemeal snare polypectomy (adenoma)  . Eus  08/2010    Dr. Owens Loffler. uT3N0 circumferential, nearly obstruction rectosigmoid adenocarcinoma, distal edge 12cm from anal verge  . Ivc filter    . Colon surgery  11/14/2010    proctectomy with colorectal anastomosis and diverting loop ileostomy (temporary planned)  . Port a cath placement    . Colostomy reversal  april 2013  . Port-a-cath removal  09/24/2011    Procedure: REMOVAL PORT-A-CATH;  Surgeon: Donato Heinz, MD;  Location: [facility];  Service: General;  Laterality: N/A;  Minor Room  . Ivc filter    . Colonoscopy  04/21/2012    RMR: Friable,fibrotic appearing colorectal anastomosis producing some luminal narrowing-not felt to be critical. path: focal erosion with slight inflammation and hyperemia. SURVEILLANCE DUE DEC 2015  . Colonoscopy N/A 11/24/2013    Dr. Rourk:somewhat fibrotic/friable anastomotic mucosal-status post biopsy (narrowing not felt to be clinically significant) Single colonic diverticulum. benign polypoid rectal mucosa    Allergies: Trazodone and nefazodone  Medications: Prior to Admission medications   Medication Sig Start Date End Date Taking? Authorizing Provider  ALPHA LIPOIC ACID PO Take 200 mg by mouth 2 (two) times daily.   Yes Historical Provider, MD  clotrimazole-betamethasone (LOTRISONE) cream Apply 1 application topically 2 (two) times daily. 08/01/14  Yes Mahala Menghini, PA-C  gabapentin (NEURONTIN) 300 MG capsule Take 3 in AM, 2  in afternoon, and 3 at bedtime. Patient taking differently: 900 mg 2 (two) times daily.  05/18/14  Yes Manon Hilding Kefalas, PA-C  hydrocortisone (ANUSOL-HC) 2.5 % rectal cream Place 1 application rectally 2 (two) times daily. Patient taking differently: Place 1 application rectally daily as needed for hemorrhoids.  08/02/14  Yes Mahala Menghini, PA-C  ibuprofen (ADVIL,MOTRIN) 200 MG tablet Take 400 mg by mouth at  bedtime as needed for pain.   Yes Historical Provider, MD  omeprazole (PRILOSEC OTC) 20 MG tablet Take 20 mg by mouth daily.   Yes Historical Provider, MD  Psyllium (METAMUCIL PO) Take 15 mLs by mouth daily.    Yes Historical Provider, MD  traZODone (DESYREL) 50 MG tablet Take 50 mg by mouth at bedtime.   Yes Historical Provider, MD  vitamin B-12 (CYANOCOBALAMIN) 1000 MCG tablet Take 1,000 mcg by mouth daily.   Yes Historical Provider, MD  vitamin E 400 UNIT capsule Take 400 Units by mouth daily.   Yes Historical Provider, MD  diphenhydramine-acetaminophen (TYLENOL PM) 25-500 MG TABS Take 1 tablet by mouth at bedtime as needed (sleep).     Historical Provider, MD     Family History  Problem Relation Age of Onset  . Colon cancer Neg Hx   . Liver disease Neg Hx   . Inflammatory bowel disease Neg Hx   . Cancer Brother     throat  . Cancer Brother     prostate    History   Social History  . Marital Status: Married    Spouse Name: N/A  . Number of Children: 2  . Years of Education: N/A   Occupational History  . self-employed Regulatory affairs officer, currently not working    Social History Main Topics  . Smoking status: Never Smoker   . Smokeless tobacco: Never Used  . Alcohol Use: No  . Drug Use: No  . Sexual Activity: Not on file   Other Topics Concern  . None   Social History Narrative    Review of Systems: A 12 point ROS discussed and pertinent positives are indicated in the HPI above.  All other systems are negative.  Review of Systems  Constitutional: Positive for activity change and fatigue. Negative for fever and appetite change.  Respiratory: Negative for shortness of breath.   Cardiovascular: Negative for chest pain.  Gastrointestinal: Negative for abdominal pain.  Musculoskeletal: Positive for gait problem.  Neurological: Positive for weakness.  Psychiatric/Behavioral: Negative for behavioral problems and confusion.    Vital Signs: BP 145/80 mmHg   Pulse 87  Temp(Src) 99.7 F (37.6 C) (Oral)  Resp 21  Ht '6\' 2"'$  (1.88 m)  Wt 101.6 kg (223 lb 15.8 oz)  BMI 28.75 kg/m2  SpO2 94%  Physical Exam  Constitutional: He is oriented to person, place, and time. He appears well-nourished.  Cardiovascular: Normal rate, regular rhythm and normal heart sounds.   Pulmonary/Chest: Effort normal and breath sounds normal. He has no wheezes.  Abdominal: Soft. Bowel sounds are normal.  Musculoskeletal: Normal range of motion. He exhibits edema.  B swelling; no redness of legs Rt larger than left  Neurological: He is alert and oriented to person, place, and time.  Skin: Skin is warm and dry.  Psychiatric: He has a normal mood and affect. His behavior is normal. Judgment and thought content normal.  Nursing note and vitals reviewed.   Mallampati Score:  MD Evaluation Airway: WNL Heart: WNL Abdomen: WNL Chest/ Lungs: WNL ASA  Classification: 3 Mallampati/Airway Score:  One  Imaging: No results found.  Labs:  CBC:  Recent Labs  07/12/14 0901 09/09/14 0455 09/10/14 0305 09/11/14 0235  WBC 7.3 10.0 9.1 7.6  HGB 15.1 13.2 12.7* 12.3*  HCT 43.7 38.5* 36.8* 36.3*  PLT 235 179 169 148*    COAGS:  Recent Labs  09/08/14 1510  INR 1.15  APTT 42*    BMP:  Recent Labs  07/12/14 0901 09/09/14 0455 09/10/14 0305 09/11/14 0235  NA 142 134* 137 140  K 3.9 3.8 3.7 3.4*  CL 109 106 106 109  CO2 26 20* 23 24  GLUCOSE 140* 178* 131* 119*  BUN '12 14 10 7  '$ CALCIUM 9.0 8.2* 8.6* 8.4*  CREATININE 0.83 0.99 0.84 0.79  GFRNONAA >90 >60 >60 >60  GFRAA >90 >60 >60 >60    LIVER FUNCTION TESTS:  Recent Labs  07/12/14 0901 09/09/14 0455 09/10/14 0305 09/11/14 0235  BILITOT 1.2 1.4* 1.1 1.3*  AST '16 17 16 25  '$ ALT 17 15* 15* 22  ALKPHOS 78 56 60 56  PROT 6.7 5.9* 5.3* 5.3*  ALBUMIN 3.9 3.1* 3.0* 2.9*    TUMOR MARKERS:  Recent Labs  01/11/14 0909 07/12/14 0901  CEA 1.2 1.9    Assessment and Plan:  Hx rectal  cancer Hx B DVT/PE x 3 Inferior vena cava filter placed 2012 Unsuccessful attempt to remove 2013 New sxs of weakness; dizzy; almost passed out and leg swelling To ED at [facility] 5/6 + CT for PE; + doppler extensive B LE DVT Tx to [facility] for eval/treatment Incidental finding of R lung mass with LAN on CT Now scheduled for B LE DVT thrombolysis /thrombectomy with poss angioplasty/stent placement Risks and Benefits discussed with the patient including, but not limited to bleeding, possible life threatening bleeding and need for blood product transfusion, vascular injury, stroke and infection. All of the patient's questions were answered, patient is agreeable to proceed. Consent signed and in chart. Will check CT Head first Then plan for initiation of lysis asap today.  Also scheduled for Rt Lung biopsy at later date Risks and Benefits discussed with the patient including, but not limited to bleeding, hemoptysis, respiratory failure requiring intubation, infection, pneumothorax requiring chest tube placement, stroke from air embolism or even death. All of the patient's questions were answered, patient is agreeable to proceed. Consent signed and in chart.      Thank you for this interesting consult.  I greatly enjoyed meeting TARIUS STANGELO and look forward to participating in their care.  Signed: Shontel Santee A 09/11/2014, 11:08 AM   I spent a total of 40 Minutes    in face to face in clinical consultation, greater than 50% of which was counseling/coordinating care for B DVT lysis; Rt lung mass bx at later date

## 2014-09-11 NOTE — Sedation Documentation (Signed)
Prior to procedure pt states he has no known allergies.

## 2014-09-11 NOTE — Progress Notes (Signed)
Medicare Important Message given?  YES (If response is "NO", the following Medicare IM given date fields will be blank) Date Medicare IM given: 09/11/14   Medicare IM given by:  Tomi Bamberger

## 2014-09-11 NOTE — Progress Notes (Signed)
[place] TEAM 1 - Stepdown/ICU TEAM Progress Note  Charles [name] ZOX:[id number] DOB: 01-16-1951 DOA: 09/08/2014 PCP: [name], MD  Admit HPI / Brief Narrative: 64 y.o. 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 He was taken to the emergency department at [place] revealed an acute pulmonary embolism. He also had urinary retention and a Foley catheter had to be placed. Patient was subsequently transferred to [place] for further management.  Clotting Hx: May 2012 - 1st DVT in R LE - IVC filter placed prior to surgery for rectal CA January 2013 - coumadin stopped  2013 - second DVT while hospitalized for attempted IVC filter removal (unsuccessful)  HPI/Subjective: The patient is alert and conversant and has no complaints.  He denies chest pain fevers chills nausea vomiting or abdominal pain.  Assessment/Plan:  Acute PE - bilateral lower extremity extensive DVTs -This is patient's third DVT/PE -Continue IV heparin until decision made on possible lung biopsy -IVC filter has been in place since 2012 according to the patient - exam suggests filter is likely clotted raising suspicion the clot propagated through the filter leading to PE - will ask IR to evaluate but suspect given probable chronicity of these clots intervention will not be an option -Will need to be on anticoagulation for life but will use IV heparin until possibility of lung biopsy addressed and possible need for catheter directed declotting procedure  Incidental finding of a right middle lobe 2 cm nodule -Worrisome for a second primary cancer - workup will be complicated by need for lifelong anticoagulation - have asked IR to consider transcutaneous biopsy candidacy while patient still on IV heparin to  streamline/expedite the workup  Lower abdominal pain due to urinary retention -CT scan did not show any intra-abdominal acute findings - pain was relieved by placement of Foley catheter per patient history -Continue Flomax 0.4 mg daily -Now urinating freely without urinary catheter  Lactic acidosis due to extensive DVT with acute PE - no evidence of sepsis -Lactic acid was significantly elevated at greater than 5 on admission -Blood/urine culture NGTD  -No obvious source of infection  Urinary retention -Continue  Flomax 0.4 mg daily  -Will try removing Foley catheter this afternoon for voiding trial  Hx Stage III Rectal Cancer -S/P chemotherapy and XRT  -followed by Oncology at [facility] - has kept regular follow-up appointments with no acute complications of late  Neuropathy secondary to chemotherapy  -Continue Neurontin   Code Status: FULL Family Communication: Spoke with patient and wife at length at bedside Disposition Plan: SDU  Consultants: IR  Procedures: 5/6 CT angiogram chest, abdomen and pelvis at [facility].;  -despite IVC filter acute multisegment PE to RUL -2 cm RML, favor bronchogenic neoplasm over solitary metastases; prominent right hilar/subcarinal nodes  Antibiotics: Zosyn 5/6 > 5/9 Vancomycin 5/6 > 5/8  DVT prophylaxis: Heparin drip  Objective: Blood pressure 132/70, pulse 97, temperature 99.5 F (37.5 C), temperature source Oral, resp. rate 22, height '6\' 2"'$  (1.88 m), weight 101.6 kg (223 lb 15.8 oz), SpO2 93 %.  Intake/Output Summary (Last 24 hours) at 09/11/14 0817 Last data filed at 09/11/14 0600  Gross per 24 hour  Intake 3848.95 ml  Output   2250 ml  Net 1598.95 ml   Exam: General: No acute respiratory distress -  alert and oriented Lungs: Clear to auscultation bilaterally without wheezes or crackles Cardiovascular: Tachycardic, regular rhythm without murmur gallop or rub normal S1 and S2 Abdomen: Nontender, nondistended, soft,  bowel sounds positive, no rebound, no ascites, no appreciable mass Extremities: No significant cyanosis, or clubbing;  2+ doughy edema bilateral lower extremities  Data Reviewed: Basic Metabolic Panel:  Recent Labs Lab 09/09/14 0455 09/10/14 0305 09/11/14 0235  NA 134* 137 140  K 3.8 3.7 3.4*  CL 106 106 109  CO2 20* 23 24  GLUCOSE 178* 131* 119*  BUN '14 10 7  '$ CREATININE 0.99 0.84 0.79  CALCIUM 8.2* 8.6* 8.4*   Liver Function Tests:  Recent Labs Lab 09/09/14 0455 09/10/14 0305 09/11/14 0235  AST '17 16 25  '$ ALT 15* 15* 22  ALKPHOS 56 60 56  BILITOT 1.4* 1.1 1.3*  PROT 5.9* 5.3* 5.3*  ALBUMIN 3.1* 3.0* 2.9*   CBC:  Recent Labs Lab 09/09/14 0455 09/10/14 0305 09/11/14 0235  WBC 10.0 9.1 7.6  NEUTROABS  --  5.3 4.6  HGB 13.2 12.7* 12.3*  HCT 38.5* 36.8* 36.3*  MCV 87.7 87.6 87.9  PLT 179 169 148*    Recent Results (from the past 240 hour(s))  MRSA PCR Screening     Status: None   Collection Time: 09/08/14  1:46 PM  Result Value Ref Range Status   MRSA by PCR NEGATIVE NEGATIVE Final    Comment:        The GeneXpert MRSA Assay (FDA approved for NASAL specimens only), is one component of a comprehensive MRSA colonization surveillance program. It is not intended to diagnose MRSA infection nor to guide or monitor treatment for MRSA infections.   Culture, blood (x 2)     Status: None (Preliminary result)   Collection Time: 09/08/14  3:00 PM  Result Value Ref Range Status   Specimen Description BLOOD RIGHT HAND  Final   Special Requests BOTTLES DRAWN AEROBIC ONLY 5CC  Final   Culture   Final           BLOOD CULTURE RECEIVED NO GROWTH TO DATE CULTURE WILL BE HELD FOR 5 DAYS BEFORE ISSUING A FINAL NEGATIVE REPORT Performed at [facility]    Report Status PENDING  Incomplete  Culture, blood (x 2)     Status: None (Preliminary result)   Collection Time: 09/08/14  3:10 PM  Result Value Ref Range Status   Specimen Description BLOOD LEFT HAND  Final    Special Requests BOTTLES DRAWN AEROBIC ONLY 2CC  Final   Culture   Final           BLOOD CULTURE RECEIVED NO GROWTH TO DATE CULTURE WILL BE HELD FOR 5 DAYS BEFORE ISSUING A FINAL NEGATIVE REPORT Note: Culture results may be compromised due to an inadequate volume of blood received in culture bottles. Performed at [facility]    Report Status PENDING  Incomplete  Urine culture     Status: None   Collection Time: 09/08/14  5:08 PM  Result Value Ref Range Status   Specimen Description URINE, CATHETERIZED  Final   Special Requests NONE  Final   Colony Count NO GROWTH Performed at [facility]   Final   Culture NO GROWTH Performed at [facility]   Final   Report Status 09/09/2014 FINAL  Final     Studies:  Recent x-ray studies have been reviewed in detail by the Attending Physician  Scheduled Meds:  Scheduled Meds: . gabapentin  900 mg Oral BID  . pantoprazole  40 mg Oral Q1200  . piperacillin-tazobactam (ZOSYN)  IV  3.375 g Intravenous Q8H  . sodium chloride  3 mL Intravenous Q12H  . tamsulosin  0.4 mg Oral Daily  . vancomycin  750 mg Intravenous Q12H    Time spent on care of this patient: 35 mins  Cherene Altes, MD [facility] For Consults/Admissions - Flow Manager - [phone] Office  [phone]  Contact MD directly via text page:      [email or web address]      password [facility]  09/11/2014, 8:17 AM   LOS: 3 days

## 2014-09-11 NOTE — Sedation Documentation (Signed)
Transferred to 2M08 via bed report to [employer]

## 2014-09-11 NOTE — Progress Notes (Signed)
ANTICOAGULATION CONSULT NOTE - Follow Up Consult  Pharmacy Consult for Heparin Indication: PE / DVT  Allergies  Allergen Reactions  . Trazodone And Nefazodone Other (See Comments)    hallucinations    Patient Measurements: Height: '6\' 2"'$  (188 cm) Weight: 223 lb 15.8 oz (101.6 kg) IBW/kg (Calculated) : 82.2  Vital Signs: Temp: 99.7 F (37.6 C) (05/09 0840) Temp Source: Oral (05/09 0840) BP: 145/80 mmHg (05/09 0841) Pulse Rate: 87 (05/09 0841)  Labs:  Recent Labs  09/08/14 1510  09/09/14 0455  09/10/14 0305 09/11/14 0235 09/11/14 1040  HGB  --   < > 13.2  --  12.7* 12.3*  --   HCT  --   --  38.5*  --  36.8* 36.3*  --   PLT  --   --  179  --  169 148*  --   APTT 42*  --   --   --   --   --  86*  LABPROT 14.9  --   --   --   --   --  14.0  INR 1.15  --   --   --   --   --  1.07  HEPARINUNFRC  --   < >  --   < > 0.38 0.23* 0.32  CREATININE  --   --  0.99  --  0.84 0.79  --   < > = values in this interval not displayed.  Estimated Creatinine Clearance: 120.3 mL/min (by C-G formula based on Cr of 0.79).   Assessment: 64 year old male continues on heparin for  PE.   This is patient's third DVT/PE.  Has IVC Flilter which has been in place since 2012 according to the patient.  PTA he had been off of anticoagulation since 2013.  HL therapeutic at 0.32, low end of range  Goal of Therapy:  Heparin level 0.3-0.7 units/ml Monitor platelets by anticoagulation protocol: Yes   Plan:  Increase heparin to 1350 units / hr Monitor daily HL, CBC, s/s of bleed  Thank you. Anette Guarneri, PharmD [phone]   09/11/2014,11:43 AM

## 2014-09-11 NOTE — Progress Notes (Signed)
09/11/14  Pharmacy-  Heparin 1800   64yo male with PE/DVT on Heparin 1350 units/hr, now with addition of TNKase which was started ~1720.  Heparin was off from 1520-1610.  Heparin rate was adjusted this AM for low therapeutic level, but now with new goal due to addition of TNKase.  Goal Heparin level 0.2-0.5  Plan: 1-  Decrease Heparin to 1300 units/hr 2-  Check Heparin level 6hr 3-  Watch for s/s of bleeding  Gracy Bruins, [facility]

## 2014-09-12 ENCOUNTER — Inpatient Hospital Stay (HOSPITAL_COMMUNITY): Payer: Medicare Other

## 2014-09-12 ENCOUNTER — Ambulatory Visit: Payer: Medicare Other | Admitting: Internal Medicine

## 2014-09-12 LAB — HEPARIN LEVEL (UNFRACTIONATED)
Heparin Unfractionated: 0.23 IU/mL — ABNORMAL LOW (ref 0.30–0.70)
Heparin Unfractionated: 0.25 IU/mL — ABNORMAL LOW (ref 0.30–0.70)
Heparin Unfractionated: 0.32 IU/mL (ref 0.30–0.70)
Heparin Unfractionated: 0.33 IU/mL (ref 0.30–0.70)

## 2014-09-12 LAB — CBC
HCT: 35.6 % — ABNORMAL LOW (ref 39.0–52.0)
HCT: 35.8 % — ABNORMAL LOW (ref 39.0–52.0)
HCT: 36.6 % — ABNORMAL LOW (ref 39.0–52.0)
HCT: 36.7 % — ABNORMAL LOW (ref 39.0–52.0)
HCT: 36.9 % — ABNORMAL LOW (ref 39.0–52.0)
Hemoglobin: 12.2 g/dL — ABNORMAL LOW (ref 13.0–17.0)
Hemoglobin: 12.2 g/dL — ABNORMAL LOW (ref 13.0–17.0)
Hemoglobin: 12.3 g/dL — ABNORMAL LOW (ref 13.0–17.0)
Hemoglobin: 12.4 g/dL — ABNORMAL LOW (ref 13.0–17.0)
Hemoglobin: 12.5 g/dL — ABNORMAL LOW (ref 13.0–17.0)
MCH: 29.2 pg (ref 26.0–34.0)
MCH: 29.6 pg (ref 26.0–34.0)
MCH: 29.6 pg (ref 26.0–34.0)
MCH: 29.8 pg (ref 26.0–34.0)
MCH: 30 pg (ref 26.0–34.0)
MCHC: 33.5 g/dL (ref 30.0–36.0)
MCHC: 33.9 g/dL (ref 30.0–36.0)
MCHC: 33.9 g/dL (ref 30.0–36.0)
MCHC: 34.1 g/dL (ref 30.0–36.0)
MCHC: 34.3 g/dL (ref 30.0–36.0)
MCV: 86.8 fL (ref 78.0–100.0)
MCV: 86.9 fL (ref 78.0–100.0)
MCV: 87.2 fL (ref 78.0–100.0)
MCV: 87.4 fL (ref 78.0–100.0)
MCV: 88.6 fL (ref 78.0–100.0)
Platelets: 141 10*3/uL — ABNORMAL LOW (ref 150–400)
Platelets: 146 10*3/uL — ABNORMAL LOW (ref 150–400)
Platelets: 146 10*3/uL — ABNORMAL LOW (ref 150–400)
Platelets: 149 10*3/uL — ABNORMAL LOW (ref 150–400)
Platelets: 150 10*3/uL (ref 150–400)
RBC: 4.1 MIL/uL — ABNORMAL LOW (ref 4.22–5.81)
RBC: 4.12 MIL/uL — ABNORMAL LOW (ref 4.22–5.81)
RBC: 4.13 MIL/uL — ABNORMAL LOW (ref 4.22–5.81)
RBC: 4.21 MIL/uL — ABNORMAL LOW (ref 4.22–5.81)
RBC: 4.22 MIL/uL (ref 4.22–5.81)
RDW: 12.6 % (ref 11.5–15.5)
RDW: 12.6 % (ref 11.5–15.5)
RDW: 12.6 % (ref 11.5–15.5)
RDW: 12.7 % (ref 11.5–15.5)
RDW: 12.7 % (ref 11.5–15.5)
WBC: 7.2 10*3/uL (ref 4.0–10.5)
WBC: 7.3 10*3/uL (ref 4.0–10.5)
WBC: 7.5 10*3/uL (ref 4.0–10.5)
WBC: 7.7 10*3/uL (ref 4.0–10.5)
WBC: 8.4 10*3/uL (ref 4.0–10.5)

## 2014-09-12 LAB — BASIC METABOLIC PANEL
Anion gap: 10 (ref 5–15)
BUN: 7 mg/dL (ref 6–20)
CO2: 25 mmol/L (ref 22–32)
Calcium: 8.6 mg/dL — ABNORMAL LOW (ref 8.9–10.3)
Chloride: 109 mmol/L (ref 101–111)
Creatinine, Ser: 0.69 mg/dL (ref 0.61–1.24)
GFR calc Af Amer: >60 mL/min (ref >60)
GFR calc non Af Amer: >60 mL/min (ref >60)
Glucose, Bld: 109 mg/dL — ABNORMAL HIGH (ref 70–99)
Potassium: 3.6 mmol/L (ref 3.5–5.1)
Sodium: 144 mmol/L (ref 135–145)

## 2014-09-12 LAB — FIBRINOGEN
Fibrinogen: 395 mg/dL (ref 204–475)
Fibrinogen: 417 mg/dL (ref 204–475)
Fibrinogen: 430 mg/dL (ref 204–475)
Fibrinogen: 431 mg/dL (ref 204–475)
Fibrinogen: 468 mg/dL (ref 204–475)

## 2014-09-12 LAB — LACTIC ACID, PLASMA: Lactic Acid, Venous: 4 mmol/L (ref 0.5–2.0)

## 2014-09-12 LAB — CLOSTRIDIUM DIFFICILE BY PCR: Toxigenic C. Difficile by PCR: NEGATIVE

## 2014-09-12 LAB — CALCIUM, IONIZED: Calcium, Ionized, Serum: 4.7 mg/dL (ref 4.5–5.6)

## 2014-09-12 MED ORDER — IOHEXOL 300 MG/ML  SOLN
150.0000 mL | Freq: Once | INTRAMUSCULAR | Status: AC | PRN
  Administered 2014-09-12: 50 mL via INTRAVENOUS

## 2014-09-12 MED ORDER — SODIUM CHLORIDE 0.9 % IV SOLN
250.0000 mL | INTRAVENOUS | Status: DC | PRN
Start: 2014-09-12 — End: 2014-09-13

## 2014-09-12 MED ORDER — HEPARIN (PORCINE) IN NACL 100-0.45 UNIT/ML-% IJ SOLN: 800.0000 [IU]/h | INTRAMUSCULAR | Status: DC

## 2014-09-12 MED ORDER — SODIUM CHLORIDE 0.9 % IV SOLN
0.5000 mg/h | INTRAVENOUS | Status: DC
  Administered 2014-09-12 – 2014-09-13 (×3): 0.5 mg/h
  Filled 2014-09-12 (×3): qty 0.5

## 2014-09-12 MED ORDER — LOPERAMIDE HCL 2 MG PO CAPS
2.0000 mg | ORAL_CAPSULE | ORAL | Status: DC | PRN
  Administered 2014-09-12 (×3): 2 mg via ORAL
  Filled 2014-09-12 (×4): qty 1

## 2014-09-12 MED ORDER — SODIUM CHLORIDE 0.9 % IJ SOLN: 3.0000 mL | INTRAMUSCULAR | Status: DC | PRN

## 2014-09-12 MED ORDER — MIDAZOLAM HCL 2 MG/2ML IJ SOLN
INTRAMUSCULAR | Status: AC | PRN
  Administered 2014-09-12: 1 mg via INTRAVENOUS
  Administered 2014-09-12: 0.5 mg via INTRAVENOUS

## 2014-09-12 MED ORDER — MIDAZOLAM HCL 2 MG/2ML IJ SOLN
INTRAMUSCULAR | Status: AC
  Filled 2014-09-12: qty 2

## 2014-09-12 MED ORDER — FENTANYL CITRATE (PF) 100 MCG/2ML IJ SOLN
INTRAMUSCULAR | Status: AC | PRN
  Administered 2014-09-12: 25 ug via INTRAVENOUS
  Administered 2014-09-12: 50 ug via INTRAVENOUS

## 2014-09-12 MED ORDER — KCL IN DEXTROSE-NACL 40-5-0.45 MEQ/L-%-% IV SOLN
INTRAVENOUS | Status: DC
  Administered 2014-09-12 – 2014-09-14 (×3): via INTRAVENOUS
  Filled 2014-09-12 (×4): qty 1000

## 2014-09-12 MED ORDER — SODIUM CHLORIDE 0.9 % IJ SOLN
3.0000 mL | Freq: Two times a day (BID) | INTRAMUSCULAR | Status: DC
  Administered 2014-09-13: 3 mL via INTRAVENOUS

## 2014-09-12 MED ORDER — FENTANYL CITRATE (PF) 100 MCG/2ML IJ SOLN
INTRAMUSCULAR | Status: AC
  Filled 2014-09-12: qty 2

## 2014-09-12 NOTE — Progress Notes (Signed)
No new complaints No distress  Filed Vitals:   09/12/14 1457 09/12/14 1501 09/12/14 1508 09/12/14 1513  BP: 145/88 142/79 143/76 138/76  Pulse: 89 89 90 90  Temp:      TempSrc:      Resp: '25 24 20 15  '$ Height:      Weight:      SpO2: 99% 98% 99% 99%   NAD HEENT WNL No JVD Chest clear RRR s M NABS, soft Minimal symmetric LE edema Neuro intact  BMET    Component Value Date/Time   NA 144 09/12/2014 0014   NA 136 10/30/2010 0801   K 3.6 09/12/2014 0014   K 4.0 10/30/2010 0801   CL 109 09/12/2014 0014   CL 101 10/30/2010 0801   CO2 25 09/12/2014 0014   CO2 27 10/30/2010 0801   GLUCOSE 109* 09/12/2014 0014   GLUCOSE 116 10/30/2010 0801   BUN 7 09/12/2014 0014   BUN 9 10/30/2010 0801   CREATININE 0.69 09/12/2014 0014   CREATININE 0.83 02/10/2011 1037   CALCIUM 8.6* 09/12/2014 0014   CALCIUM 8.9 10/30/2010 0801   GFRNONAA >60 09/12/2014 0014   GFRAA >60 09/12/2014 0014    CBC    Component Value Date/Time   WBC 7.3 09/12/2014 1128   WBC 6.5 02/04/2011 1059   RBC 4.21* 09/12/2014 1128   RBC 4.72 05/21/2012 1015   RBC 4.57 02/04/2011 1059   HGB 12.3* 09/12/2014 1128   HGB 11.6* 02/04/2011 1059   HCT 36.7* 09/12/2014 1128   HCT 34.7* 02/04/2011 1059   PLT 150 09/12/2014 1128   PLT 282 02/04/2011 1059   MCV 87.2 09/12/2014 1128   MCV 76.0* 02/04/2011 1059   MCH 29.2 09/12/2014 1128   MCH 25.4* 02/04/2011 1059   MCHC 33.5 09/12/2014 1128   MCHC 33.5 02/04/2011 1059   RDW 12.6 09/12/2014 1128   RDW 18.5* 02/04/2011 1059   LYMPHSABS 2.2 09/11/2014 0235   LYMPHSABS 1.1 02/04/2011 1059   MONOABS 0.6 09/11/2014 0235   MONOABS 0.5 02/04/2011 1059   EOSABS 0.2 09/11/2014 0235   EOSABS 0.1 02/04/2011 1059   BASOSABS 0.0 09/11/2014 0235   BASOSABS 0.1 02/04/2011 1059    CXR: no new film  IMPRESSION: H/O rectal Ca H/O prior VTE with IVC filter placement Acute recurrent large DVT and small PE New finding of RML nodule  PLAN: Cont cath directed lytics per  IR Cont heparin gtt per pharmacy Will need TTNA of lung mass (? prior to initiation of long term anticoagulation) Will need life long anticoagulation  Merton Border, MD ; [facility] service Mobile [phone].  After 5:30 PM or weekends, call [phone]

## 2014-09-12 NOTE — Procedures (Addendum)
Interventional Radiology Procedure Note  Procedure: Venogram.  Placement of new infusion catheter across persisting IVC filter thrombus.   Findings:  Near complete resolution of thrombus at the left common iliac vein/IVC confluence.  Persisting thrombus at the IVC filter cone, extending into the IVC above the filter. Placement of 24cm infusion length EKOS catheter across the thrombus. Complications: None. Recommendations:   - continue tnk lysis through the EKOS catheter - continue serial H&H and fibrinogen checks. - baseline fibrinogen at this time is 430.  - monitor for hemorrhage overnight.  - will repeat venogram tomorrow.     - Routine line care   Signed,  Dulcy Fanny. Earleen Newport, DO   Addendum to clarify the IR ggt's:  Right popliteal sheath:  Normal saline for patency at 20cc/hr Left popliteal sheath: Normal saline for patency at 20cc/hr EKOS catheter through the left pop sheath:  TNK @ 0.'5mg'$ /hr.   The coolant is saline at 35cc/hr.   Peripheral arm is the full-dose heparin ggt

## 2014-09-12 NOTE — Progress Notes (Signed)
Pt noted with increased bleeding from LLE sheath site, soaking the gauze with some oozing out. Pt has had multiple BMs thru the night and the bleeding has gotten worse as pt has to be turned to use bedpan. Encouraging pt to keep leg straight with turns. Dr. Jimmy Footman made aware as well as Dr. Barbie Banner in IR. Will continue to monitor pt.

## 2014-09-12 NOTE — Progress Notes (Signed)
Utilization review completed.  

## 2014-09-12 NOTE — Sedation Documentation (Signed)
Patient is resting comfortably. 

## 2014-09-12 NOTE — Sedation Documentation (Signed)
Patient is resting comfortably. Tolerated procedure well

## 2014-09-12 NOTE — Sedation Documentation (Signed)
Patient states pain is better

## 2014-09-12 NOTE — Sedation Documentation (Signed)
Patient c/o severe back pain

## 2014-09-12 NOTE — Progress Notes (Signed)
[place] Progress Note Patient Name: Charles [name] DOB: [birth year] MRN: [id number]   Date of Service  09/12/2014  HPI/Events of Note  Multiple soft stools requiring movement in order to clean patient which is aggravating bleeding from sheath sites where thrombolytics are infusing for catheter directed lysis.  eICU Interventions  Plan: Will check C Diff - if negative can use imodium prn. Continue to monitor bleeding for now as HD stable and Hgb has not dropped.     Intervention Category Minor Interventions: Clinical assessment - ordering diagnostic tests;Routine modifications to care plan (e.g. PRN medications for pain, fever)  Gerianne Simonet 09/12/2014, 12:57 AM

## 2014-09-12 NOTE — Progress Notes (Signed)
ANTICOAGULATION CONSULT NOTE - Follow Up Consult  Pharmacy Consult for Heparin Indication: PE / DVT   64yo male with PE/DVT on Heparin 1300 units/hr and addition of TNKase which was started ~1720  Yesterday 09/11/14.  Heparin was off from 1520-1610 on 09/11/14 then resumed @ 17: 87 yesterday.    New goal Heparin level 0.2-0.5 (while on TNKase)  1st 6 hour heparin level = 0.23 on Heparin drip 1300 units/hr.  TNKase continues. RN reports that some bleeding around 2 sheaths that the MD is aware of. Heparin & TNKase continue.  Heparin level is therapeutic.   Plan: 1-  Continue Heparin at  1300 units/hr 2-  Check Heparin level 6hr 3-  Watch for s/s of bleeding  Nicole Cella, RPh Clinical Pharmacist Pager: [phone] 09/12/2014 1:22 AM

## 2014-09-12 NOTE — Progress Notes (Signed)
ANTICOAGULATION CONSULT NOTE - Follow Up Consult  Pharmacy Consult:  Heparin Indication:  Acute PE and DVT  Allergies  Allergen Reactions  . Trazodone And Nefazodone Other (See Comments)    hallucinations    Patient Measurements: Height: '6\' 2"'$  (188 cm) Weight: 235 lb 14.3 oz (107 kg) IBW/kg (Calculated) : 82.2 Heparin Dosing Weight: 104 kg  Vital Signs: Temp: 98.6 F (37 C) (05/10 0800) Temp Source: Oral (05/10 0800) BP: 131/78 mmHg (05/10 0700) Pulse Rate: 79 (05/10 0700)  Labs:  Recent Labs  09/10/14 0305 09/11/14 0235 09/11/14 1040 09/11/14 2007 09/12/14 0013 09/12/14 0014 09/12/14 0606  HGB 12.7* 12.3*  --  12.2*  --  12.4* 12.5*  HCT 36.8* 36.3*  --  36.6*  --  36.6* 36.9*  PLT 169 148*  --  150  --  149* 141*  APTT  --   --  86*  --   --   --   --   LABPROT  --   --  14.0  --   --   --   --   INR  --   --  1.07  --   --   --   --   HEPARINUNFRC 0.38 0.23* 0.32 <0.10* 0.23*  --  0.33  CREATININE 0.84 0.79  --   --   --  0.69  --   TROPONINI  --   --   --  <0.03  --   --   --     Estimated Creatinine Clearance: 123.1 mL/min (by C-G formula based on Cr of 0.69).     Assessment: 31 YOM with history of DVT s/p IVF filter placement in 2012 and off anticoagulation since 2013.  Patient with new active PE and DVT to continue on TNKase and IV heparin.  Heparin level is therapeutic.  Noted documentation of increased bleeding from LLE sheath site.  Bleeding has slowed down this AM per RN.  CBC remains relatively stable.    Goal of Therapy:  Heparin level 0.2 - 0.5 units/ml Monitor platelets by anticoagulation protocol: Yes    Plan:  - Continue heparin gtt at 1300 units/hr - TNKase at 0.5 mg/hr per MD - Daily HL / CBC - Monitor closely for s/sx of bleeding    Velora Horstman D. Mina Marble, PharmD, BCPS Pager:  [phone] 09/12/2014, 10:10 AM

## 2014-09-12 NOTE — Progress Notes (Signed)
ANTICOAGULATION CONSULT NOTE - Follow Up Consult  Pharmacy Consult:  Heparin Indication:  Acute PE and DVT  Allergies  Allergen Reactions  . Trazodone And Nefazodone Other (See Comments)    hallucinations    Patient Measurements: Height: '6\' 2"'$  (188 cm) Weight: 235 lb 14.3 oz (107 kg) IBW/kg (Calculated) : 82.2 Heparin Dosing Weight: 104 kg  Vital Signs: Temp: 97.8 F (36.6 C) (05/10 1244) Temp Source: Oral (05/10 1244) BP: 140/74 mmHg (05/10 1900) Pulse Rate: 79 (05/10 1900)  Labs:  Recent Labs  09/10/14 0305 09/11/14 0235 09/11/14 1040 09/11/14 2007 09/12/14 0013 09/12/14 0014 09/12/14 0606 09/12/14 1128 09/12/14 1608 09/12/14 1728  HGB 12.7* 12.3*  --  12.2*  --  12.4* 12.5* 12.3*  --  12.2*  HCT 36.8* 36.3*  --  36.6*  --  36.6* 36.9* 36.7*  --  35.8*  PLT 169 148*  --  150  --  149* 141* 150  --  146*  APTT  --   --  86*  --   --   --   --   --   --   --   LABPROT  --   --  14.0  --   --   --   --   --   --   --   INR  --   --  1.07  --   --   --   --   --   --   --   HEPARINUNFRC 0.38 0.23* 0.32 <0.10* 0.23*  --  0.33  --  0.32  --   CREATININE 0.84 0.79  --   --   --  0.69  --   --   --   --   TROPONINI  --   --   --  <0.03  --   --   --   --   --   --     Estimated Creatinine Clearance: 123.1 mL/min (by C-G formula based on Cr of 0.69).  Assessment: 41 YOM with history of DVT s/p IVF filter placement in 2012 and off anticoagulation since 2013.  Patient with new active PE and DVT to continue on TNKase and IV heparin s/p venogram this afternoon. Now on TNKase via EKOS catheter across the thrombus.   Heparin level remains therapeutic at 0.32.  FIbrinogen 430>468.   RN reports oozing from LLE sheath site is about the same. Has not increased. CBC remains relatively stable.    Goal of Therapy:  Heparin level 0.2 - 0.5 units/ml Monitor platelets by anticoagulation protocol: Yes   Plan:    Continue heparin gtt at 1300 units/hr   TNKase at 0.5 mg/hr per  MD   Heparin level, CBC and fibrinogen q6hrs. Next labs ~12am.   Monitor closely for any bleeding/increased oozing.   For repeat venogram on 09/13/14.  Charles [name], [place] Pager: [phone] 09/12/2014, 8:01 PM

## 2014-09-13 ENCOUNTER — Inpatient Hospital Stay (HOSPITAL_COMMUNITY): Payer: Medicare Other

## 2014-09-13 LAB — HEPARIN LEVEL (UNFRACTIONATED)
Heparin Unfractionated: 0.25 IU/mL — ABNORMAL LOW (ref 0.30–0.70)
Heparin Unfractionated: 0.35 IU/mL (ref 0.30–0.70)
Heparin Unfractionated: 0.25 IU/mL — ABNORMAL LOW (ref 0.30–0.70)

## 2014-09-13 LAB — CBC
HCT: 36.3 % — ABNORMAL LOW (ref 39.0–52.0)
Hemoglobin: 12.3 g/dL — ABNORMAL LOW (ref 13.0–17.0)
MCH: 29.5 pg (ref 26.0–34.0)
MCHC: 33.9 g/dL (ref 30.0–36.0)
MCV: 87.1 fL (ref 78.0–100.0)
Platelets: 152 10*3/uL (ref 150–400)
RBC: 4.17 MIL/uL — ABNORMAL LOW (ref 4.22–5.81)
RDW: 12.6 % (ref 11.5–15.5)
WBC: 6.2 10*3/uL (ref 4.0–10.5)

## 2014-09-13 LAB — FIBRINOGEN
Fibrinogen: 391 mg/dL (ref 204–475)
Fibrinogen: 445 mg/dL (ref 204–475)

## 2014-09-13 MED ORDER — IOHEXOL 300 MG/ML  SOLN
100.0000 mL | Freq: Once | INTRAMUSCULAR | Status: AC | PRN
Start: 2014-09-13 — End: 2014-09-13
  Administered 2014-09-13: 70 mL via INTRAVENOUS

## 2014-09-13 MED ORDER — HEPARIN (PORCINE) IN NACL 100-0.45 UNIT/ML-% IJ SOLN
1600.0000 [IU]/h | INTRAMUSCULAR | Status: DC
  Administered 2014-09-13: 1350 [IU]/h via INTRAVENOUS
  Administered 2014-09-14: 1450 [IU]/h via INTRAVENOUS
  Filled 2014-09-13 (×2): qty 250

## 2014-09-13 NOTE — Progress Notes (Signed)
[facility] [place] Progress Note Patient Name: KOHL POLINSKY DOB: February 11, 1951 MRN: [id number]   Date of Service  09/13/2014  HPI/Events of Note  tNKase infusion now stopped. Sheath removed at 1 PM. No bleeding from sheath site. Orders reconciled.   eICU Interventions  Will order: 1. Advance to Regular diet.  2. Transfer to Telemetry.      Intervention Category Minor Interventions: Routine modifications to care plan (e.g. PRN medications for pain, fever)  Sommer,Steven Eugene 09/13/2014, 4:19 PM

## 2014-09-13 NOTE — Progress Notes (Signed)
ANTICOAGULATION CONSULT NOTE - Follow Up Consult  Pharmacy Consult for Heparin  Indication: pulmonary embolus and DVT  Allergies  Allergen Reactions  . Trazodone And Nefazodone Other (See Comments)    hallucinations    Patient Measurements: Height: '6\' 2"'$  (188 cm) Weight: 235 lb 14.3 oz (107 kg) IBW/kg (Calculated) : 82.2  Vital Signs: Temp: 98.4 F (36.9 C) (05/11 0029) Temp Source: Oral (05/11 0029) BP: 133/70 mmHg (05/11 0100) Pulse Rate: 83 (05/11 0100)  Labs:  Recent Labs  09/10/14 0305 09/11/14 0235 09/11/14 1040 09/11/14 2007  09/12/14 0014 09/12/14 0606 09/12/14 1128 09/12/14 1608 09/12/14 1728 09/12/14 2306  HGB 12.7* 12.3*  --  12.2*  --  12.4* 12.5* 12.3*  --  12.2* 12.2*  HCT 36.8* 36.3*  --  36.6*  --  36.6* 36.9* 36.7*  --  35.8* 35.6*  PLT 169 148*  --  150  --  149* 141* 150  --  146* 146*  APTT  --   --  86*  --   --   --   --   --   --   --   --   LABPROT  --   --  14.0  --   --   --   --   --   --   --   --   INR  --   --  1.07  --   --   --   --   --   --   --   --   HEPARINUNFRC 0.38 0.23* 0.32 <0.10*  < >  --  0.33  --  0.32  --  0.25*  CREATININE 0.84 0.79  --   --   --  0.69  --   --   --   --   --   TROPONINI  --   --   --  <0.03  --   --   --   --   --   --   --   < > = values in this interval not displayed.  Estimated Creatinine Clearance: 123.1 mL/min (by C-G formula based on Cr of 0.69).   Assessment: Heparin for new PE/DVT, continue on catheter-directed thrombolysis with TNKase, HL is at goal of 0.25, other labs as above.   Goal of Therapy:  Heparin level 0.2-0.5 units/ml Monitor platelets by anticoagulation protocol: Yes   Plan:  -Continue heparin at 1300 units/hr -HL q6h while on TNKase -Monitor closely for bleeding -F/U IR plans this AM  Narda Bonds 09/13/2014,2:02 AM

## 2014-09-13 NOTE — Progress Notes (Signed)
ANTICOAGULATION CONSULT NOTE - Follow Up Consult  Pharmacy Consult:  Heparin Indication:  Acute PE and DVT  Allergies  Allergen Reactions  . Trazodone And Nefazodone Other (See Comments)    hallucinations      Assessment: 31 YOM with history of DVT s/p IVF filter placement in 2012 and off anticoagulation since 2013.  He is s/p thrombolysis with TNKase.  Heparin level is 0.25 units/ml.  No bleeding noted per RN   Goal of Therapy: HL 0.3-0.5 units/mL post-operatively (off TNKase), increase to 0.3-0.7 units/mL once more stable   Plan: - Increase heparin gtt slightly to 1450 units/hr - Check am HL - Monitor for bleeding/oozing - F/U long-term AC plan Thanks for allowing pharmacy to be a part of this patient's care.  Excell Seltzer, PharmD Clinical Pharmacist, [phone]  09/13/2014, 9:43 PM

## 2014-09-13 NOTE — Progress Notes (Addendum)
ANTICOAGULATION CONSULT NOTE - Follow Up Consult  Pharmacy Consult:  Heparin Indication:  Acute PE and DVT  Allergies  Allergen Reactions  . Trazodone And Nefazodone Other (See Comments)    hallucinations    Patient Measurements: Height: '6\' 2"'$  (188 cm) Weight: 235 lb 14.3 oz (107 kg) IBW/kg (Calculated) : 82.2 Heparin Dosing Weight: 104 kg  Vital Signs: Temp: 98 F (36.7 C) (05/11 0759) Temp Source: Oral (05/11 0759) BP: 141/73 mmHg (05/11 0800) Pulse Rate: 73 (05/11 0800)  Labs:  Recent Labs  09/11/14 0235 09/11/14 1040 09/11/14 2007  09/12/14 0014  09/12/14 1608 09/12/14 1728 09/12/14 2306 09/13/14 0500  HGB 12.3*  --  12.2*  --  12.4*  < >  --  12.2* 12.2* 12.3*  HCT 36.3*  --  36.6*  --  36.6*  < >  --  35.8* 35.6* 36.3*  PLT 148*  --  150  --  149*  < >  --  146* 146* 152  APTT  --  86*  --   --   --   --   --   --   --   --   LABPROT  --  14.0  --   --   --   --   --   --   --   --   INR  --  1.07  --   --   --   --   --   --   --   --   HEPARINUNFRC 0.23* 0.32 <0.10*  < >  --   < > 0.32  --  0.25* 0.25*  CREATININE 0.79  --   --   --  0.69  --   --   --   --   --   TROPONINI  --   --  <0.03  --   --   --   --   --   --   --   < > = values in this interval not displayed.  Estimated Creatinine Clearance: 123.1 mL/min (by C-G formula based on Cr of 0.69).     Assessment: 20 YOM with history of DVT s/p IVF filter placement in 2012 and off anticoagulation since 2013.  Patient with new active small PE and large DVT to continue on TNKase and IV heparin.  Heparin level is therapeutic.  Noted documentation of increased bleeding from LLE sheath site.  Patient stated oozing has been stable/improving.  CBC remains relatively stable.   5/9 IR - DVT in left femoral venous system, left common iliac vein, IVC, within IVC filter 5/10 IR - significant reduction in volume of thrombus at confluence of left common iliac vein and IVC, persistent thrombus involving cone of IVC  filter and free floating within IVC filter cap 5/11 IR - repeat venogram today   Goal of Therapy:  Heparin level 0.2 - 0.5 units/ml Monitor platelets by anticoagulation protocol: Yes    Plan:  - Continue heparin gtt at 1300 units/hr - TNKase IK at 0.5 mg/hr per MD - Daily CBC - Q6H heparin levels - Monitor closely for bleeding/oozing    Anderson Middlebrooks D. Mina Marble, PharmD, BCPS Pager:  [phone] 09/13/2014, 9:04 AM    ================================   Addendum: - s/p IR:  improvement in thrombus, minimal non-occlusive residual, lysis terminated - heparin held at 1220 and resumed at 1245 per documentation - left leg sheath pulled - HL 0.35, therapeutic, right before patient went down to IR  Goal of Therapy: HL 0.3-0.5 units/mL post-operatively (off TNKase), increase to 0.3-0.7 units/mL once more stable   Plan: - Increase heparin gtt slightly to 1350 units/hr - Check 6 hr HL post rate increase - D/C Q6H heparin levels - Daily HL / CBC - Monitor for bleeding/oozing - F/U long-term AC plan    Mardy Hoppe D. Mina Marble, PharmD, BCPS Pager:  [phone] 09/13/2014, 1:43 PM

## 2014-09-13 NOTE — Progress Notes (Signed)
Taken to 2M08 by Wyvonne Lenz RN and transporter, monitored, in sinus rhythm. Report given to [facility]. Dressing checked to bilateral posterior knees with Anderson Malta RN and both clean dry and intact. Both sites level 0.

## 2014-09-13 NOTE — Progress Notes (Signed)
Right leg sheath removed by IR tech Heather. Left posterior knee dressing  Clean dry and intact and level 0.

## 2014-09-13 NOTE — Progress Notes (Signed)
No new complaints No distress  Filed Vitals:   09/13/14 0800 09/13/14 0900 09/13/14 1000 09/13/14 1100  BP: 141/73 140/74 132/77 140/75  Pulse: 73 78 73 73  Temp:      TempSrc:      Resp: '19 19 19 20  '$ Height:      Weight:      SpO2: 95% 95% 96% 99%   NAD HEENT WNL No JVD Chest clear RRR s M NABS, soft Minimal symmetric LE edema Neuro intact  BMET    Component Value Date/Time   NA 144 09/12/2014 0014   NA 136 10/30/2010 0801   K 3.6 09/12/2014 0014   K 4.0 10/30/2010 0801   CL 109 09/12/2014 0014   CL 101 10/30/2010 0801   CO2 25 09/12/2014 0014   CO2 27 10/30/2010 0801   GLUCOSE 109* 09/12/2014 0014   GLUCOSE 116 10/30/2010 0801   BUN 7 09/12/2014 0014   BUN 9 10/30/2010 0801   CREATININE 0.69 09/12/2014 0014   CREATININE 0.83 02/10/2011 1037   CALCIUM 8.6* 09/12/2014 0014   CALCIUM 8.9 10/30/2010 0801   GFRNONAA >60 09/12/2014 0014   GFRAA >60 09/12/2014 0014    CBC    Component Value Date/Time   WBC 6.2 09/13/2014 0500   WBC 6.5 02/04/2011 1059   RBC 4.17* 09/13/2014 0500   RBC 4.72 05/21/2012 1015   RBC 4.57 02/04/2011 1059   HGB 12.3* 09/13/2014 0500   HGB 11.6* 02/04/2011 1059   HCT 36.3* 09/13/2014 0500   HCT 34.7* 02/04/2011 1059   PLT 152 09/13/2014 0500   PLT 282 02/04/2011 1059   MCV 87.1 09/13/2014 0500   MCV 76.0* 02/04/2011 1059   MCH 29.5 09/13/2014 0500   MCH 25.4* 02/04/2011 1059   MCHC 33.9 09/13/2014 0500   MCHC 33.5 02/04/2011 1059   RDW 12.6 09/13/2014 0500   RDW 18.5* 02/04/2011 1059   LYMPHSABS 2.2 09/11/2014 0235   LYMPHSABS 1.1 02/04/2011 1059   MONOABS 0.6 09/11/2014 0235   MONOABS 0.5 02/04/2011 1059   EOSABS 0.2 09/11/2014 0235   EOSABS 0.1 02/04/2011 1059   BASOSABS 0.0 09/11/2014 0235   BASOSABS 0.1 02/04/2011 1059    CXR: no new film  IMPRESSION: H/O rectal Ca H/O prior VTE with IVC filter placement Acute recurrent large DVT and small PE New finding of RML nodule  PLAN: Cont cath directed lytics per  IR  Venogram today Cont heparin gtt per pharmacy Will need eval of new RML nodule  In light of severity of VTE, favor waiting 4-6 wks prior to holding anticoagulation  Will arrange for outpt f/u with [facility] pulm  Consider repeat CT chest in 4-6 wks to assess growth rate Will need life long anticoagulation  He may be discharged out of ICU once lytic therapy is discontinued (perhaps later today)  Merton Border, MD ; [facility] service Mobile [phone].  After 5:30 PM or weekends, call [phone]

## 2014-09-13 NOTE — Procedures (Signed)
IVCgram shows improvement in thrombus, minimal nonocclusive residual. Lysis terminated. No complication No blood loss. See complete dictation in [facility].

## 2014-09-13 NOTE — Progress Notes (Addendum)
Heparin was discontinued on 49M at 1220 and restarted at request of Dr. Jarvis Newcomer at 1245. Left leg sheath pulled by IR tech Heather and is currently holding pressure.

## 2014-09-14 LAB — CBC
HCT: 36.9 % — ABNORMAL LOW (ref 39.0–52.0)
Hemoglobin: 12.6 g/dL — ABNORMAL LOW (ref 13.0–17.0)
MCH: 29.4 pg (ref 26.0–34.0)
MCHC: 34.1 g/dL (ref 30.0–36.0)
MCV: 86.2 fL (ref 78.0–100.0)
Platelets: 156 10*3/uL (ref 150–400)
RBC: 4.28 MIL/uL (ref 4.22–5.81)
RDW: 12.6 % (ref 11.5–15.5)
WBC: 5.6 10*3/uL (ref 4.0–10.5)

## 2014-09-14 LAB — BASIC METABOLIC PANEL
Anion gap: 8 (ref 5–15)
BUN: 5 mg/dL — ABNORMAL LOW (ref 6–20)
CO2: 22 mmol/L (ref 22–32)
Calcium: 8.2 mg/dL — ABNORMAL LOW (ref 8.9–10.3)
Chloride: 110 mmol/L (ref 101–111)
Creatinine, Ser: 0.69 mg/dL (ref 0.61–1.24)
GFR calc Af Amer: >60 mL/min (ref >60)
GFR calc non Af Amer: >60 mL/min (ref >60)
Glucose, Bld: 131 mg/dL — ABNORMAL HIGH (ref 65–99)
Potassium: 3.8 mmol/L (ref 3.5–5.1)
Sodium: 140 mmol/L (ref 135–145)

## 2014-09-14 LAB — CULTURE, BLOOD (ROUTINE X 2)
Culture: NO GROWTH
Culture: NO GROWTH

## 2014-09-14 LAB — PROTIME-INR
INR: 1.06 (ref 0.00–1.49)
Prothrombin Time: 13.9 seconds (ref 11.6–15.2)

## 2014-09-14 LAB — HEPARIN LEVEL (UNFRACTIONATED): Heparin Unfractionated: 0.26 IU/mL — ABNORMAL LOW (ref 0.30–0.70)

## 2014-09-14 MED ORDER — WARFARIN SODIUM 7.5 MG PO TABS
7.5000 mg | ORAL_TABLET | Freq: Once | ORAL | Status: AC
  Administered 2014-09-14: 7.5 mg via ORAL
  Filled 2014-09-14: qty 1

## 2014-09-14 MED ORDER — PATIENT'S GUIDE TO USING COUMADIN BOOK
Freq: Once | Status: DC
  Filled 2014-09-14: qty 1

## 2014-09-14 MED ORDER — ENOXAPARIN SODIUM 100 MG/ML ~~LOC~~ SOLN
100.0000 mg | Freq: Two times a day (BID) | SUBCUTANEOUS | Status: AC
  Administered 2014-09-14: 100 mg via SUBCUTANEOUS
  Filled 2014-09-14: qty 1

## 2014-09-14 MED ORDER — WARFARIN VIDEO: Freq: Once | Status: DC

## 2014-09-14 MED ORDER — WARFARIN - PHARMACIST DOSING INPATIENT
Freq: Every day | Status: DC
  Administered 2014-09-14: 18:00:00

## 2014-09-14 NOTE — Progress Notes (Signed)
Both Op sites where procedure performed are both at a level zero. No drainage present. Dressing clean, dry, and intact. No bleeding noted anywhere on patient while being on Heparin. Will continue to monitor patient to end of shift.

## 2014-09-14 NOTE — Progress Notes (Signed)
Referring Physician(s): Dr Titus Mould  Subjective:  Extensive B LE DVT  Venous thrombolysis 5/9-5/11 Has done well Great result per post procedure venogram Up in bed Feeling great  Lung mass for bx as OP when appropriate  Allergies: Trazodone and nefazodone  Medications: Prior to Admission medications   Medication Sig Start Date End Date Taking? Authorizing Provider  ALPHA LIPOIC ACID PO Take 200 mg by mouth 2 (two) times daily.   Yes Historical Provider, MD  clotrimazole-betamethasone (LOTRISONE) cream Apply 1 application topically 2 (two) times daily. 08/01/14  Yes Mahala Menghini, PA-C  gabapentin (NEURONTIN) 300 MG capsule Take 3 in AM, 2 in afternoon, and 3 at bedtime. Patient taking differently: 900 mg 2 (two) times daily.  05/18/14  Yes Manon Hilding Kefalas, PA-C  hydrocortisone (ANUSOL-HC) 2.5 % rectal cream Place 1 application rectally 2 (two) times daily. Patient taking differently: Place 1 application rectally daily as needed for hemorrhoids.  08/02/14  Yes Mahala Menghini, PA-C  ibuprofen (ADVIL,MOTRIN) 200 MG tablet Take 400 mg by mouth at bedtime as needed for pain.   Yes Historical Provider, MD  omeprazole (PRILOSEC OTC) 20 MG tablet Take 20 mg by mouth daily.   Yes Historical Provider, MD  Psyllium (METAMUCIL PO) Take 15 mLs by mouth daily.    Yes Historical Provider, MD  traZODone (DESYREL) 50 MG tablet Take 50 mg by mouth at bedtime.   Yes Historical Provider, MD  vitamin B-12 (CYANOCOBALAMIN) 1000 MCG tablet Take 1,000 mcg by mouth daily.   Yes Historical Provider, MD  vitamin E 400 UNIT capsule Take 400 Units by mouth daily.   Yes Historical Provider, MD  diphenhydramine-acetaminophen (TYLENOL PM) 25-500 MG TABS Take 1 tablet by mouth at bedtime as needed (sleep).     Historical Provider, MD     Vital Signs: BP 128/77 mmHg  Pulse 94  Temp(Src) 97.6 F (36.4 C) (Oral)  Resp 24  Ht '6\' 2"'$  (1.88 m)  Wt 103.012 kg (227 lb 1.6 oz)  BMI 29.15 kg/m2  SpO2  93%  Physical Exam  Musculoskeletal: Normal range of motion. He exhibits no edema or tenderness.  Skin: Skin is warm and dry.  Sites of lysis are NT; no bleeding; no hematoma 2+ pulses Skin warm  Nursing note and vitals reviewed.   Imaging: Ct Head Wo Contrast  09/11/2014   CLINICAL DATA:  64 y.o. 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 He was taken to the emergency department at [place] revealed an acute pulmonary embolism. He also had urinary retention and a Foley catheter had to be placed. Patient was subsequently transferred to [facility] for further management.  EXAM: CT HEAD WITHOUT CONTRAST  TECHNIQUE: Contiguous axial images were obtained from the base of the skull through the vertex without intravenous contrast.  COMPARISON:  None.  FINDINGS: Ventricles are normal in size and configuration. There are no parenchymal masses or mass effect. There is no evidence of a cortical infarct. There are subtle areas of white matter hypoattenuation consistent with chronic microvascular ischemic change.  There are no extra-axial masses or abnormal fluid collections.  No intracranial hemorrhage.  Visualized sinuses and mastoid air cells are clear. No skull lesion.  IMPRESSION: 1. No acute intracranial abnormalities. 2. Minimal chronic microvascular ischemic change. 3. No evidence  of neoplastic/metastatic disease.   Electronically Signed   By: Lajean Manes M.D.   On: 09/11/2014 14:53   Ir Veno/ext/bi  09/12/2014   CLINICAL DATA:  IVC filter with IVC thrombus. Possible pelvic DVT. Acute left lower extremity DVT. Chronic right lower extremity DVT.  EXAM: IR INFUSION THROMBOL VENOUS INITIAL (MS); IR ULTRASOUND GUIDANCE VASC ACCESS RIGHT; BILATERAL EXTREMITY VENOGRAPHY; IR ULTRASOUND  GUIDANCE VASC ACCESS LEFT; INFERIOR VENA CAVOGRAM  FLUOROSCOPY TIME:  4 minutes.  MEDICATIONS AND MEDICAL HISTORY: Versed 1 mg, Fentanyl 0 mcg.  Additional Medications: None.  ANESTHESIA/SEDATION: Moderate sedation time: 35 minutes  CONTRAST:  100 cc of is opaque 320  PROCEDURE: The procedure, risks, benefits, and alternatives were explained to the patient. Questions regarding the procedure were encouraged and answered. The patient understands and consents to the procedure.  The bilateral popliteal fossa were prepped with Betadine in a sterile fashion, and a sterile drape was applied covering the operative field. A sterile gown and sterile gloves were used for the procedure.  Sonographic imaging of the right popliteal fossa confirms chronic occlusion of the right popliteal vein. Collateral venous structures were noted. Bilateral popliteal fossa approach was the planned course of action an order to obtain bilateral pelvic venous system access. Under sonographic guidance, a micropuncture needle was inserted into a venous structure within the right popliteal fossa and removed over a 018 wire which was upsized to a Bentson. A vertebral the catheter was advanced into the venous structure. Right lower extremity and pelvic venography was performed via the vertebral the catheter. This demonstrated chronic occlusion of the right femoral venous system with collateralization. The right external and common iliac veins are patent. The vertebral catheter was exchanged for a short 4 [place] sheath.  Under sonographic guidance, a micropuncture needle was inserted into the left popliteal vein and removed over a 018 wire which was up sized to a Bentson. A 7 French sheath and vertebral catheter were inserted. Left lower extremity and pelvic venography was performed. IVC venography was performed. This confirmed thrombus in the left femoral venous system, left common iliac vein, and IVC.  The vertebral catheter was exchanged for a 90 cm  50 cm infusion length multi side-hole catheter. Thrombolysis was instituted with TNK at 0.5 milligrams/hour.  FINDINGS: Right femoral venography confirms chronic occlusion of the femoral venous system with collateralization.  Right pelvic venography demonstrates patency of the right common and external iliac veins  Left femoral venography confirms extensive DVT within the left femoral venous system.  Left iliac venography demonstrates patency of the external iliac vein but partially occlusive thrombus in the common iliac vein.  IVC venography confirms thrombus extending into the lower IVC as well as a large thrombus burden within the IVC filter and extending above the filter.  COMPLICATIONS: None  IMPRESSION: Venography confirms DVT in the left femoral venous system, left common iliac vein, IVC, and within the IVC filter. Left femoral to IVC multi side-hole infusion thrombosis was instituted.  Right lower extremity venography confirms chronic occlusion of the femoral venous system with collateralization. The right iliac venous system is patent and does not require lysis.   Electronically Signed   By: Marybelle Killings M.D.   On: 09/12/2014 09:02   Ir Venocavagram Ivc  09/12/2014   CLINICAL DATA:  IVC filter with IVC thrombus. Possible pelvic DVT. Acute left lower extremity DVT. Chronic right lower extremity DVT.  EXAM: IR INFUSION THROMBOL VENOUS INITIAL (MS); IR ULTRASOUND GUIDANCE VASC ACCESS RIGHT; BILATERAL EXTREMITY VENOGRAPHY;  IR ULTRASOUND GUIDANCE VASC ACCESS LEFT; INFERIOR VENA CAVOGRAM  FLUOROSCOPY TIME:  4 minutes.  MEDICATIONS AND MEDICAL HISTORY: Versed 1 mg, Fentanyl 0 mcg.  Additional Medications: None.  ANESTHESIA/SEDATION: Moderate sedation time: 35 minutes  CONTRAST:  100 cc of is opaque 320  PROCEDURE: The procedure, risks, benefits, and alternatives were explained to the patient. Questions regarding the procedure were encouraged and answered. The patient understands and consents to the procedure.   The bilateral popliteal fossa were prepped with Betadine in a sterile fashion, and a sterile drape was applied covering the operative field. A sterile gown and sterile gloves were used for the procedure.  Sonographic imaging of the right popliteal fossa confirms chronic occlusion of the right popliteal vein. Collateral venous structures were noted. Bilateral popliteal fossa approach was the planned course of action an order to obtain bilateral pelvic venous system access. Under sonographic guidance, a micropuncture needle was inserted into a venous structure within the right popliteal fossa and removed over a 018 wire which was upsized to a Bentson. A vertebral the catheter was advanced into the venous structure. Right lower extremity and pelvic venography was performed via the vertebral the catheter. This demonstrated chronic occlusion of the right femoral venous system with collateralization. The right external and common iliac veins are patent. The vertebral catheter was exchanged for a short 4 [place] sheath.  Under sonographic guidance, a micropuncture needle was inserted into the left popliteal vein and removed over a 018 wire which was up sized to a Bentson. A 7 French sheath and vertebral catheter were inserted. Left lower extremity and pelvic venography was performed. IVC venography was performed. This confirmed thrombus in the left femoral venous system, left common iliac vein, and IVC.  The vertebral catheter was exchanged for a 90 cm 50 cm infusion length multi side-hole catheter. Thrombolysis was instituted with TNK at 0.5 milligrams/hour.  FINDINGS: Right femoral venography confirms chronic occlusion of the femoral venous system with collateralization.  Right pelvic venography demonstrates patency of the right common and external iliac veins  Left femoral venography confirms extensive DVT within the left femoral venous system.  Left iliac venography demonstrates patency of the external iliac vein but  partially occlusive thrombus in the common iliac vein.  IVC venography confirms thrombus extending into the lower IVC as well as a large thrombus burden within the IVC filter and extending above the filter.  COMPLICATIONS: None  IMPRESSION: Venography confirms DVT in the left femoral venous system, left common iliac vein, IVC, and within the IVC filter. Left femoral to IVC multi side-hole infusion thrombosis was instituted.  Right lower extremity venography confirms chronic occlusion of the femoral venous system with collateralization. The right iliac venous system is patent and does not require lysis.   Electronically Signed   By: Marybelle Killings M.D.   On: 09/12/2014 09:02   Ir US Guide Vasc Access Left  09/12/2014   CLINICAL DATA:  IVC filter with IVC thrombus. Possible pelvic DVT. Acute left lower extremity DVT. Chronic right lower extremity DVT.  EXAM: IR INFUSION THROMBOL VENOUS INITIAL (MS); IR ULTRASOUND GUIDANCE VASC ACCESS RIGHT; BILATERAL EXTREMITY VENOGRAPHY; IR ULTRASOUND GUIDANCE VASC ACCESS LEFT; INFERIOR VENA CAVOGRAM  FLUOROSCOPY TIME:  4 minutes.  MEDICATIONS AND MEDICAL HISTORY: Versed 1 mg, Fentanyl 0 mcg.  Additional Medications: None.  ANESTHESIA/SEDATION: Moderate sedation time: 35 minutes  CONTRAST:  100 cc of is opaque 320  PROCEDURE: The procedure, risks, benefits, and alternatives were explained to the patient. Questions regarding the  procedure were encouraged and answered. The patient understands and consents to the procedure.  The bilateral popliteal fossa were prepped with Betadine in a sterile fashion, and a sterile drape was applied covering the operative field. A sterile gown and sterile gloves were used for the procedure.  Sonographic imaging of the right popliteal fossa confirms chronic occlusion of the right popliteal vein. Collateral venous structures were noted. Bilateral popliteal fossa approach was the planned course of action an order to obtain bilateral pelvic venous system  access. Under sonographic guidance, a micropuncture needle was inserted into a venous structure within the right popliteal fossa and removed over a 018 wire which was upsized to a Bentson. A vertebral the catheter was advanced into the venous structure. Right lower extremity and pelvic venography was performed via the vertebral the catheter. This demonstrated chronic occlusion of the right femoral venous system with collateralization. The right external and common iliac veins are patent. The vertebral catheter was exchanged for a short 4 [place] sheath.  Under sonographic guidance, a micropuncture needle was inserted into the left popliteal vein and removed over a 018 wire which was up sized to a Bentson. A 7 French sheath and vertebral catheter were inserted. Left lower extremity and pelvic venography was performed. IVC venography was performed. This confirmed thrombus in the left femoral venous system, left common iliac vein, and IVC.  The vertebral catheter was exchanged for a 90 cm 50 cm infusion length multi side-hole catheter. Thrombolysis was instituted with TNK at 0.5 milligrams/hour.  FINDINGS: Right femoral venography confirms chronic occlusion of the femoral venous system with collateralization.  Right pelvic venography demonstrates patency of the right common and external iliac veins  Left femoral venography confirms extensive DVT within the left femoral venous system.  Left iliac venography demonstrates patency of the external iliac vein but partially occlusive thrombus in the common iliac vein.  IVC venography confirms thrombus extending into the lower IVC as well as a large thrombus burden within the IVC filter and extending above the filter.  COMPLICATIONS: None  IMPRESSION: Venography confirms DVT in the left femoral venous system, left common iliac vein, IVC, and within the IVC filter. Left femoral to IVC multi side-hole infusion thrombosis was instituted.  Right lower extremity venography confirms  chronic occlusion of the femoral venous system with collateralization. The right iliac venous system is patent and does not require lysis.   Electronically Signed   By: Marybelle Killings M.D.   On: 09/12/2014 09:02   Ir US Guide Vasc Access Right  09/12/2014   CLINICAL DATA:  IVC filter with IVC thrombus. Possible pelvic DVT. Acute left lower extremity DVT. Chronic right lower extremity DVT.  EXAM: IR INFUSION THROMBOL VENOUS INITIAL (MS); IR ULTRASOUND GUIDANCE VASC ACCESS RIGHT; BILATERAL EXTREMITY VENOGRAPHY; IR ULTRASOUND GUIDANCE VASC ACCESS LEFT; INFERIOR VENA CAVOGRAM  FLUOROSCOPY TIME:  4 minutes.  MEDICATIONS AND MEDICAL HISTORY: Versed 1 mg, Fentanyl 0 mcg.  Additional Medications: None.  ANESTHESIA/SEDATION: Moderate sedation time: 35 minutes  CONTRAST:  100 cc of is opaque 320  PROCEDURE: The procedure, risks, benefits, and alternatives were explained to the patient. Questions regarding the procedure were encouraged and answered. The patient understands and consents to the procedure.  The bilateral popliteal fossa were prepped with Betadine in a sterile fashion, and a sterile drape was applied covering the operative field. A sterile gown and sterile gloves were used for the procedure.  Sonographic imaging of the right popliteal fossa confirms chronic occlusion of the right popliteal  vein. Collateral venous structures were noted. Bilateral popliteal fossa approach was the planned course of action an order to obtain bilateral pelvic venous system access. Under sonographic guidance, a micropuncture needle was inserted into a venous structure within the right popliteal fossa and removed over a 018 wire which was upsized to a Bentson. A vertebral the catheter was advanced into the venous structure. Right lower extremity and pelvic venography was performed via the vertebral the catheter. This demonstrated chronic occlusion of the right femoral venous system with collateralization. The right external and common  iliac veins are patent. The vertebral catheter was exchanged for a short 4 [place] sheath.  Under sonographic guidance, a micropuncture needle was inserted into the left popliteal vein and removed over a 018 wire which was up sized to a Bentson. A 7 French sheath and vertebral catheter were inserted. Left lower extremity and pelvic venography was performed. IVC venography was performed. This confirmed thrombus in the left femoral venous system, left common iliac vein, and IVC.  The vertebral catheter was exchanged for a 90 cm 50 cm infusion length multi side-hole catheter. Thrombolysis was instituted with TNK at 0.5 milligrams/hour.  FINDINGS: Right femoral venography confirms chronic occlusion of the femoral venous system with collateralization.  Right pelvic venography demonstrates patency of the right common and external iliac veins  Left femoral venography confirms extensive DVT within the left femoral venous system.  Left iliac venography demonstrates patency of the external iliac vein but partially occlusive thrombus in the common iliac vein.  IVC venography confirms thrombus extending into the lower IVC as well as a large thrombus burden within the IVC filter and extending above the filter.  COMPLICATIONS: None  IMPRESSION: Venography confirms DVT in the left femoral venous system, left common iliac vein, IVC, and within the IVC filter. Left femoral to IVC multi side-hole infusion thrombosis was instituted.  Right lower extremity venography confirms chronic occlusion of the femoral venous system with collateralization. The right iliac venous system is patent and does not require lysis.   Electronically Signed   By: Marybelle Killings M.D.   On: 09/12/2014 09:02   Ir Infusion Thrombol Venous Initial (ms)  09/12/2014   CLINICAL DATA:  IVC filter with IVC thrombus. Possible pelvic DVT. Acute left lower extremity DVT. Chronic right lower extremity DVT.  EXAM: IR INFUSION THROMBOL VENOUS INITIAL (MS); IR ULTRASOUND  GUIDANCE VASC ACCESS RIGHT; BILATERAL EXTREMITY VENOGRAPHY; IR ULTRASOUND GUIDANCE VASC ACCESS LEFT; INFERIOR VENA CAVOGRAM  FLUOROSCOPY TIME:  4 minutes.  MEDICATIONS AND MEDICAL HISTORY: Versed 1 mg, Fentanyl 0 mcg.  Additional Medications: None.  ANESTHESIA/SEDATION: Moderate sedation time: 35 minutes  CONTRAST:  100 cc of is opaque 320  PROCEDURE: The procedure, risks, benefits, and alternatives were explained to the patient. Questions regarding the procedure were encouraged and answered. The patient understands and consents to the procedure.  The bilateral popliteal fossa were prepped with Betadine in a sterile fashion, and a sterile drape was applied covering the operative field. A sterile gown and sterile gloves were used for the procedure.  Sonographic imaging of the right popliteal fossa confirms chronic occlusion of the right popliteal vein. Collateral venous structures were noted. Bilateral popliteal fossa approach was the planned course of action an order to obtain bilateral pelvic venous system access. Under sonographic guidance, a micropuncture needle was inserted into a venous structure within the right popliteal fossa and removed over a 018 wire which was upsized to a Bentson. A vertebral the catheter was advanced into the  venous structure. Right lower extremity and pelvic venography was performed via the vertebral the catheter. This demonstrated chronic occlusion of the right femoral venous system with collateralization. The right external and common iliac veins are patent. The vertebral catheter was exchanged for a short 4 [place] sheath.  Under sonographic guidance, a micropuncture needle was inserted into the left popliteal vein and removed over a 018 wire which was up sized to a Bentson. A 7 French sheath and vertebral catheter were inserted. Left lower extremity and pelvic venography was performed. IVC venography was performed. This confirmed thrombus in the left femoral venous system, left  common iliac vein, and IVC.  The vertebral catheter was exchanged for a 90 cm 50 cm infusion length multi side-hole catheter. Thrombolysis was instituted with TNK at 0.5 milligrams/hour.  FINDINGS: Right femoral venography confirms chronic occlusion of the femoral venous system with collateralization.  Right pelvic venography demonstrates patency of the right common and external iliac veins  Left femoral venography confirms extensive DVT within the left femoral venous system.  Left iliac venography demonstrates patency of the external iliac vein but partially occlusive thrombus in the common iliac vein.  IVC venography confirms thrombus extending into the lower IVC as well as a large thrombus burden within the IVC filter and extending above the filter.  COMPLICATIONS: None  IMPRESSION: Venography confirms DVT in the left femoral venous system, left common iliac vein, IVC, and within the IVC filter. Left femoral to IVC multi side-hole infusion thrombosis was instituted.  Right lower extremity venography confirms chronic occlusion of the femoral venous system with collateralization. The right iliac venous system is patent and does not require lysis.   Electronically Signed   By: Marybelle Killings M.D.   On: 09/12/2014 09:02   Ir Jacolyn Reedy F/u Eval Art/ven Alwyn Ren Day (ms)  09/12/2014   CLINICAL DATA:  64 year old male with a history of iliocaval DVT, as well as thrombus involving the [facility] of IVC filter.  Venous pharmacologic thrombo lysis was initiated Sep 11, 2014 via left popliteal vein.  He returns today for a lysis check.  EXAM: ILIAC VENOGRAM.  IVC CAVAGRAM  CONTINUATION OF THROMBOLYSIS  FLUOROSCOPY TIME:  192.5 mGy  COMPARISON:  09/11/2014  PROCEDURE: The procedure, risks, benefits, and alternatives were explained to the patient. Questions regarding the procedure were encouraged and answered. The patient understands and consents to the procedure.  The patient is placed into the prone position on the fluoroscopy table  and the indwelling left popliteal sheath was sterilely prepped and draped.  The obturator wire from the indwelling Uni fuse catheter was then removed, and a Bentson wire was advanced through the Uni fuse catheter. Uni fuse catheter was then exchanged for diagnostic catheter. A cavagram and left iliac vein venogram were performed.  Exchange length stiff Glidewire was then advanced through the diagnostic catheter, and the wire was navigated through the tines of the IVC filter, embedding the wire within the thrombus. The diagnostic catheter was then exchanged for a 24 cm infusion length EKOS catheter system.  Patient tolerated the procedure well and remained hemodynamically stable throughout.  No complications were encountered and no significant blood loss was encountered.  FINDINGS: Cavagram demonstrates persisting thrombus at the [facility] of the IVC filter, with extension of thrombus superior to the cap, free floating within the central IVC.  Injection of the left common iliac vein demonstrates significant reduction in volume of ilio caval thrombus at the confluence, with only a small residual persisting. Shadow across the most  proximal left common iliac vein may represent the impression of the overlying right common iliac artery.  Placement of a new 24 cm infusion length EKOS infusion catheter into the thrombus at the IVC filter [facility].  IMPRESSION: Status post repeat cavagram and left common iliac vein venogram demonstrates significant reduction in volume of thrombus at the confluence of the left common iliac vein and IVC, with persisting thrombus involving the [facility] of the IVC filter and free floating within the IVC above the filter cap.  Placement of new infusion catheter into the thrombus at the cap of the IVC filter, for continuation of pharmacologic thrombolysis.  Signed,  Dulcy Fanny. Earleen Newport, DO  [facility]  [facility]   Electronically Signed   By: Corrie Mckusick D.O.    On: 09/12/2014 17:59   Ir Jacolyn Reedy F/u Eval Art/ven Final Day (ms)  09/13/2014   CLINICAL DATA:  Left lower extremity and iliac DVT with thrombus in a previously placed IVC catheter, post catheter directed thrombolysis.  EXAM: IR THROMB F/U EVAL ART/VEN FINAL DAY  ANESTHESIA/SEDATION: None provided  MEDICATIONS: None administered  CONTRAST:  17m OMNIPAQUE IOHEXOL 300 MG/ML  SOLN  PROCEDURE: The Ekos catheter was withdrawn into the left iliac vein for iliac and IVC venogram. The catheter was removed and venography through the popliteal venous sheath was performed. A 5 [place] catheter was advanced into the left common iliac vein for additional detailed IVCgram. Catheter and sheaths were removed and hemostasis achieved. The patient tolerated the procedure well.  COMPLICATIONS: None immediate  FINDINGS: There is good antegrade flow through the left common iliac vein and IVC. There is residual chronic mural thrombus in the left common iliac vein and iliac confluence, of the no evident hemodynamic significance. There is a small amount of residual clot near the apex of the IVC filter, of no hemodynamic significance. There is good flow through the IVC with no collateral filling.  IMPRESSION: 1. Interval reduction in left iliac and IVC thrombus. There is a small amount of residual thrombus, without obstruction. Thrombolysis was terminated. Recommend that the patient remain anticoagulated.   Electronically Signed   By: DLucrezia EuropeM.D.   On: 09/13/2014 13:24    Labs:  CBC:  Recent Labs  09/12/14 1728 09/12/14 2306 09/13/14 0500 09/14/14 0554  WBC 7.7 7.5 6.2 5.6  HGB 12.2* 12.2* 12.3* 12.6*  HCT 35.8* 35.6* 36.3* 36.9*  PLT 146* 146* 152 156    COAGS:  Recent Labs  09/08/14 1510 09/11/14 1040  INR 1.15 1.07  APTT 42* 86*    BMP:  Recent Labs  09/10/14 0305 09/11/14 0235 09/12/14 0014 09/14/14 0554  NA 137 140 144 140  K 3.7 3.4* 3.6 3.8  CL 106 109 109 110  CO2 '23 24 25 22   '$ GLUCOSE 131* 119* 109* 131*  BUN '10 7 7 '$ 5*  CALCIUM 8.6* 8.4* 8.6* 8.2*  CREATININE 0.84 0.79 0.69 0.69  GFRNONAA >60 >60 >60 >60  GFRAA >60 >60 >60 >60    LIVER FUNCTION TESTS:  Recent Labs  07/12/14 0901 09/09/14 0455 09/10/14 0305 09/11/14 0235  BILITOT 1.2 1.4* 1.1 1.3*  AST '16 17 16 25  '$ ALT 17 15* 15* 22  ALKPHOS 78 56 60 56  PROT 6.7 5.9* 5.3* 5.3*  ALBUMIN 3.9 3.1* 3.0* 2.9*    Assessment and Plan:  B lower extremity venous lysis 5/9-5/11 Great result Pt doing well For lifetime anticoagulation PMD will contact Rad when timing approriate  for OP lung mass bx Pt has good understanding  Signed: Wilmoth Rasnic A 09/14/2014, 12:45 PM   I spent a total of 15 Minutes in face to face in clinical consultation/evaluation, greater than 50% of which was counseling/coordinating care for B LE Venous lysis

## 2014-09-14 NOTE — Discharge Instructions (Signed)

## 2014-09-14 NOTE — Progress Notes (Signed)
Pt ambulated in hallway with wife. Pt tolerated well. Will continue to monitor pt. Ranelle Oyster, RN

## 2014-09-14 NOTE — Progress Notes (Addendum)
ANTICOAGULATION CONSULT NOTE - Follow Up Consult  Pharmacy Consult:  Heparin Indication:  Acute PE and DVT  Allergies  Allergen Reactions  . Trazodone And Nefazodone Other (See Comments)    hallucinations    Assessment: 12 YOM with history of DVT s/p IVF filter placement in 2012 and off anticoagulation since 2013.  He is s/p thrombolysis with TNKase. Now to start coumadin. Heparin rate adjusted after subtherapeutic level. CBC stable, INR 1.07 on 5/9 before TNKase.  Goal of Therapy: INR 2-3 HL 0.3-0.5 units/mL post-operatively (off TNKase), increase to 0.3-0.7 units/mL once more stable.  Plan: - Coumadin 7.5 mg po x 1 - f/u Heparin level at 1400 - Daily PT/INR, heparin level and CBC - Monitor for bleeding/oozing   - Coumadin book and video - Coumadin education with pharmacist  Thanks for allowing pharmacy to be a part of this patient's care.  Maryanna Shape, PharmD, BCPS  Clinical Pharmacist  Pager: [phone]  09/14/2014, 10:24 AM  Addendum: Will switch heparin to lovenox. Renal function good.   Plan: Lovenox 100 mg sq q 12 hrs (please give first dose 1 hr after heparin is stopped) Monitor renal function and CBC  Maryanna Shape, PharmD, BCPS  Clinical Pharmacist  Pager: [phone]

## 2014-09-14 NOTE — Progress Notes (Signed)
Preferred pharm copay will be $40 for Eliquis and Xarelto. Pradaxa will be $80, prior auth not required

## 2014-09-14 NOTE — Progress Notes (Signed)
No acute events.  Feels great following thrombolysis.  Tolerating diet.  Repeat venogram 5/11 showed improvement in thrombus of IVC with minimal nonocclusive residual.  Lysis terminated and sheath removed, no bleeding overnight.  Filed Vitals:   09/13/14 1700 09/13/14 1805 09/13/14 2206 09/14/14 0529  BP: 145/72 149/69 144/72 128/77  Pulse: 75 79 96 94  Temp:  99.8 F (37.7 C) 98.4 F (36.9 C) 97.6 F (36.4 C)  TempSrc:  Oral Oral Oral  Resp: '22 20 18 24  '$ Height:  '6\' 2"'$  (1.88 m)    Weight:  103.012 kg (227 lb 1.6 oz)    SpO2: 98% 94% 93% 93%   NAD [place]/AT, PERRL. MMM. CTA bilaterally, no W/R/R. RRR s M NABS, soft Minimal symmetric LE edema, non-pitting. Neuro intact  BMET    Component Value Date/Time   NA 140 09/14/2014 0554   NA 136 10/30/2010 0801   K 3.8 09/14/2014 0554   K 4.0 10/30/2010 0801   CL 110 09/14/2014 0554   CL 101 10/30/2010 0801   CO2 22 09/14/2014 0554   CO2 27 10/30/2010 0801   GLUCOSE 131* 09/14/2014 0554   GLUCOSE 116 10/30/2010 0801   BUN 5* 09/14/2014 0554   BUN 9 10/30/2010 0801   CREATININE 0.69 09/14/2014 0554   CREATININE 0.83 02/10/2011 1037   CALCIUM 8.2* 09/14/2014 0554   CALCIUM 8.9 10/30/2010 0801   GFRNONAA >60 09/14/2014 0554   GFRAA >60 09/14/2014 0554    CBC    Component Value Date/Time   WBC 5.6 09/14/2014 0554   WBC 6.5 02/04/2011 1059   RBC 4.28 09/14/2014 0554   RBC 4.72 05/21/2012 1015   RBC 4.57 02/04/2011 1059   HGB 12.6* 09/14/2014 0554   HGB 11.6* 02/04/2011 1059   HCT 36.9* 09/14/2014 0554   HCT 34.7* 02/04/2011 1059   PLT 156 09/14/2014 0554   PLT 282 02/04/2011 1059   MCV 86.2 09/14/2014 0554   MCV 76.0* 02/04/2011 1059   MCH 29.4 09/14/2014 0554   MCH 25.4* 02/04/2011 1059   MCHC 34.1 09/14/2014 0554   MCHC 33.5 02/04/2011 1059   RDW 12.6 09/14/2014 0554   RDW 18.5* 02/04/2011 1059   LYMPHSABS 2.2 09/11/2014 0235   LYMPHSABS 1.1 02/04/2011 1059   MONOABS 0.6 09/11/2014 0235   MONOABS 0.5  02/04/2011 1059   EOSABS 0.2 09/11/2014 0235   EOSABS 0.1 02/04/2011 1059   BASOSABS 0.0 09/11/2014 0235   BASOSABS 0.1 02/04/2011 1059    CXR: no new film  IMPRESSION: H/O rectal Ca H/O prior VTE with IVC filter placement Acute recurrent large DVT and small PE New finding of RML nodule  PLAN: Continue heparin gtt per pharmacy. 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  Furthermore, he has been on coumadin in the past so is comfortable with management / lifestyle modifications).   Once INR therapeutic x 2, then will d/c heparin. Will need lifelong anticoagulation. Will need eval of new RML nodule  In light of severity of VTE, favor waiting 4-6 wks prior to holding anticoagulation.  Will arrange for outpt f/u with [facility] in 4 - 6 weeks.  Consider repeat CT chest in 4-6 wks to assess growth rate. Continue bedrest till 1pm today (09/14/14), then mobilize as able.   Montey Hora, [place] [facility] Pager: [phone]  or [phone] 09/14/2014, 9:11 AM

## 2014-09-14 NOTE — Progress Notes (Addendum)
ANTICOAGULATION CONSULT NOTE  Pharmacy Consult:  Heparin Indication:  PE/DVT  Allergies  Allergen Reactions  . Trazodone And Nefazodone Other (See Comments)    hallucinations    Patient Measurements: Height: '6\' 2"'$  (188 cm) Weight: 227 lb 1.6 oz (103.012 kg) IBW/kg (Calculated) : 82.2 Heparin Dosing Weight: 104 kg  Vital Signs: Temp: 97.6 F (36.4 C) (05/12 0529) Temp Source: Oral (05/12 0529) BP: 128/77 mmHg (05/12 0529) Pulse Rate: 94 (05/12 0529)  Labs:  Recent Labs  09/11/14 1040  09/11/14 2007  09/12/14 0014  09/12/14 2306 09/13/14 0500 09/13/14 1140 09/13/14 2109 09/14/14 0554  HGB  --   < > 12.2*  --  12.4*  < > 12.2* 12.3*  --   --  12.6*  HCT  --   < > 36.6*  --  36.6*  < > 35.6* 36.3*  --   --  36.9*  PLT  --   < > 150  --  149*  < > 146* 152  --   --  156  APTT 86*  --   --   --   --   --   --   --   --   --   --   LABPROT 14.0  --   --   --   --   --   --   --   --   --   --   INR 1.07  --   --   --   --   --   --   --   --   --   --   HEPARINUNFRC 0.32  --  <0.10*  < >  --   < > 0.25* 0.25* 0.35 0.25* 0.26*  CREATININE  --   --   --   --  0.69  --   --   --   --   --  0.69  TROPONINI  --   --  <0.03  --   --   --   --   --   --   --   --   < > = values in this interval not displayed.  Estimated Creatinine Clearance: 121 mL/min (by C-G formula based on Cr of 0.69).  Assessment: 64 yo male with PE/DVT s/p lysis, for heparin  Goal of Therapy:  Heparin level 0.3-0.7 Monitor platelets by anticoagulation protocol: Yes    Plan:  Increase Heparin 1600 units/hr Check heparin level in 8 hours.  Phillis Knack, PharmD, BCPS  09/14/2014, 6:57 AM

## 2014-09-15 ENCOUNTER — Telehealth: Payer: Self-pay | Admitting: *Deleted

## 2014-09-15 DIAGNOSIS — I2699 Other pulmonary embolism without acute cor pulmonale: Secondary | ICD-10-CM

## 2014-09-15 LAB — CBC
HCT: 38.5 % — ABNORMAL LOW (ref 39.0–52.0)
Hemoglobin: 13.2 g/dL (ref 13.0–17.0)
MCH: 29.7 pg (ref 26.0–34.0)
MCHC: 34.3 g/dL (ref 30.0–36.0)
MCV: 86.7 fL (ref 78.0–100.0)
Platelets: 179 10*3/uL (ref 150–400)
RBC: 4.44 MIL/uL (ref 4.22–5.81)
RDW: 12.7 % (ref 11.5–15.5)
WBC: 5.6 10*3/uL (ref 4.0–10.5)

## 2014-09-15 LAB — BASIC METABOLIC PANEL
Anion gap: 10 (ref 5–15)
BUN: 9 mg/dL (ref 6–20)
CO2: 23 mmol/L (ref 22–32)
Calcium: 8.8 mg/dL — ABNORMAL LOW (ref 8.9–10.3)
Chloride: 107 mmol/L (ref 101–111)
Creatinine, Ser: 0.79 mg/dL (ref 0.61–1.24)
GFR calc Af Amer: >60 mL/min (ref >60)
GFR calc non Af Amer: >60 mL/min (ref >60)
Glucose, Bld: 136 mg/dL — ABNORMAL HIGH (ref 65–99)
Potassium: 3.8 mmol/L (ref 3.5–5.1)
Sodium: 140 mmol/L (ref 135–145)

## 2014-09-15 LAB — PROTIME-INR
INR: 1 (ref 0.00–1.49)
Prothrombin Time: 13.3 seconds (ref 11.6–15.2)

## 2014-09-15 MED ORDER — GABAPENTIN 300 MG PO CAPS: 900.0000 mg | ORAL_CAPSULE | Freq: Two times a day (BID) | ORAL | Status: DC

## 2014-09-15 MED ORDER — TAMSULOSIN HCL 0.4 MG PO CAPS: 0.4000 mg | ORAL_CAPSULE | Freq: Every day | ORAL | Status: DC

## 2014-09-15 MED ORDER — WARFARIN SODIUM 5 MG PO TABS: ORAL_TABLET | ORAL | Status: DC

## 2014-09-15 MED ORDER — ENOXAPARIN SODIUM 100 MG/ML ~~LOC~~ SOLN: 100.0000 mg | Freq: Two times a day (BID) | SUBCUTANEOUS | Status: AC

## 2014-09-15 MED ORDER — WARFARIN SODIUM 7.5 MG PO TABS
7.5000 mg | ORAL_TABLET | Freq: Once | ORAL | Status: DC
  Filled 2014-09-15: qty 1

## 2014-09-15 NOTE — Progress Notes (Signed)
ANTICOAGULATION CONSULT NOTE - Follow Up Consult  Pharmacy Consult for Coumadin Indication: Acute PE & DVT  Allergies  Allergen Reactions  . Trazodone And Nefazodone Other (See Comments)    hallucinations    Patient Measurements: Height: '6\' 2"'$  (188 cm) Weight: 227 lb 1.6 oz (103.012 kg) IBW/kg (Calculated) : 82.2 Heparin Dosing Weight:   Vital Signs: Temp: 98.3 F (36.8 C) (05/13 0522) Temp Source: Oral (05/13 0522) BP: 115/73 mmHg (05/13 1102) Pulse Rate: 89 (05/13 1102)  Labs:  Recent Labs  09/13/14 0500 09/13/14 1140 09/13/14 2109 09/14/14 0554 09/14/14 1455 09/15/14 0437  HGB 12.3*  --   --  12.6*  --  13.2  HCT 36.3*  --   --  36.9*  --  38.5*  PLT 152  --   --  156  --  179  LABPROT  --   --   --   --  13.9 13.3  INR  --   --   --   --  1.06 1.00  HEPARINUNFRC 0.25* 0.35 0.25* 0.26*  --   --   CREATININE  --   --   --  0.69  --  0.79    Estimated Creatinine Clearance: 121 mL/min (by C-G formula based on Cr of 0.79).   Medications:  Scheduled:  . gabapentin  900 mg Oral BID  . pantoprazole  40 mg Oral Q1200  . patient's guide to using coumadin book   Does not apply Once  . sodium chloride  3 mL Intravenous Q12H  . tamsulosin  0.4 mg Oral Daily  . warfarin   Does not apply Once  . Warfarin - Pharmacist Dosing Inpatient   Does not apply q1800    Assessment: 64yo male with Acute DVT/PE, s/p TNKase & Heparin.  He is now on Day #2/5 minimum overlap of Couamdin and Lovenox.  INR 1 today, CBC stable.  No bleeding noted.  Goal of Therapy:  INR 2-3 Monitor platelets by anticoagulation protocol: Yes   Plan:  Repeat Coumadin 7.'5mg'$  Monitor for s/s of bleeding Coumadin education  Gracy Bruins, PharmD Clinical Pharmacist [facility]

## 2014-09-15 NOTE — Telephone Encounter (Signed)
Order entered.  Patient is currently admitted in hospital.  Put order in as outpatient per RA request. Nothing further needed.

## 2014-09-15 NOTE — Progress Notes (Signed)
Patient discharge teaching given, including activity, diet, follow-up appoints, and medications. Patient verbalized understanding of all discharge instructions. IV access was d/c'd. Vitals are stable. Skin is intact except as charted in most recent assessments. Pt to be escorted out by NT, to be driven home by family. 

## 2014-09-15 NOTE — Progress Notes (Signed)
NCM called patient's pharmacy ,[facility] in [place], lovenox is $6 for him, script was sent electronically.

## 2014-09-15 NOTE — Progress Notes (Signed)
IVC filter-placed 2012 , Adm for for acute DVT and pulmonary embolism s/ post catheter lysis of DVT Can dc on Lovenox 100 mg q 12h Coumadin daily , with overlap x 48 once INR therapeutic PET scan as outpatient to evaluate right middle lobe nodule and lymphadenopathy He will follow-up with PCP -dr Woody Seller for INR check on Monday Needs outpt FU with [place] after pET scan in 2-3 wks  Rigoberto Noel MD

## 2014-09-15 NOTE — Telephone Encounter (Signed)
-----   Message from Rigoberto Noel, MD sent at 09/15/2014  1:38 PM EDT ----- Please schedule PET scan as outpatient

## 2014-09-15 NOTE — Discharge Summary (Signed)
Physician Discharge Summary  Patient ID: Charles [name] MRN: [id number] DOB/AGE: 07/02/1950 64 y.o.  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 VWoody Selleron 5/16 for INR with further adjustment of coumadin Pulmonary follow up arranged, PET scan pending.  He will need PET scan before follow up with Pulmonary.  Office will call him with an appointment.  Coumadin education given per pharmacy prior to discharge.  He will need life long anticoagulation                DISCHARGE SUMMARY   Charles DELAPENAis a 64y.o. y/o male with a PMH of [facility]), prior DVT (off anticoagulation since 2013), & neuropathy secondary to chemotherapy [facility] after waking up feeling diaphoretic.  He got up and used the restroom, became dizzy and almost passed out.  He returned to bed and woke around 5am with similar symptoms prompting his wife to call 911.  He reported nausea and shortness of breath.  [facility]  He also had urinary retention and required placement of a foley catheter.  [facility] by IR for bilateral DVT's, PE, and what appeared to be a clotted IVC filter.CT of the head was negative for acute intracranial findings.  On 5/7 LE venous duplex was positive for DVT in bilateral common  femoral, profunda, femoral veins, DVT in right popliteal vein.  He was taken to the IR suite 5/9 and had BLE DVT thrombolysis performed. Procedure was successful and pt was then transferred to ICU for close observation.  Of note, CT of the chest also revealed incidental finding of a RML 2cm lung nodule and lymphadenopathy, worrisome for bronchogenic metastasis.  He was observed in ICU on IV heparin.  The patient remained hemodynamically stable and was transitioned out of ICU.  He was seen by clinical pharmacy and transitioned from IV heparin to lovenox / coumadin.  He was medically cleared for discharge 5/13 with plans as above.                SIGNIFICANT EVENTS: [facility] lysis to BLE DVT's.  SIGNIFICANT DIAGNOSTIC STUDIES [facility] PE, R lung mass LE duplex 5/7 >>> DVT in b/l common femoral, profunda, femoral veins. DVT in right popliteal vein. CT head 5/9 >>> no acute intracranial findings.  CONSULTS Interventional Radiology     Discharge Exam: General: wdwn male in NAD Neuro: AAOx4, MAEs CV: s1s2 rrr, no m/r/g PULM: resp's even/non-labored, lungs bilaterally clear GI: NTND, bsx4 active  Extremities: warm/dry  Charles [name]:   09/14/14 1350 09/14/14 2135 09/15/14 0522 09/15/14 1102  BP: 129/69 123/73 128/70 115/73  Pulse: 97 110 82 89  Temp: 97.6 F (36.4 C) 99.2 F (37.3 C) 98.3 F (36.8 C)   TempSrc: Oral Oral Oral   Resp: '20 17 18 18  ' Height:      Weight:      SpO2: 93% 95% 96% 98%     Discharge Labs  BMET  Recent Labs Lab 09/10/14 0305 09/11/14 0235 09/12/14 0014 09/14/14 0554 09/15/14 0437  NA 137 140 144 140 140  K 3.7 3.4* 3.6 3.8 3.8  CL 106 109 109 110 107  CO2 '23 24 25 22 23  ' GLUCOSE 131* 119* 109* 131* 136*  BUN '10 7 7 ' 5* 9  CREATININE 0.84 0.79 0.69 0.69 0.79  CALCIUM 8.6* 8.4* 8.6* 8.2* 8.8*   CBC  Recent Labs Lab 09/13/14 0500 09/14/14 0554 09/15/14 0437   HGB 12.3* 12.6* 13.2  HCT 36.3* 36.9* 38.5*  WBC 6.2 5.6 5.6  PLT 152 156 179   Anti-Coagulation  Recent Labs Lab 09/08/14 1510 09/11/14 1040 09/14/14 1455 09/15/14 0437  INR 1.15 1.07 1.06 1.00    Discharge Instructions    Call MD for:  difficulty breathing, headache or visual disturbances    Complete by:  As directed      Call MD for:  extreme fatigue    Complete by:  As directed      Call MD for:  hives    Complete by:  As directed      Call MD for:  persistant dizziness or light-headedness    Complete by:  As directed      Call MD for:  persistant nausea and vomiting    Complete by:  As directed      Call MD for:  severe uncontrolled pain    Complete by:  As directed      Call MD for:  temperature >100.4    Complete by:  As directed      Diet - low sodium heart healthy    Complete by:  As directed      Discharge instructions    Complete by:  As directed   1. Take 7.5 mg at [facility] on 5/13 & 5/14.  Take 5 mg at [facility] on 5/15 then follow up with your primary MD on 5/16 for INR and further adjustment.  2.  Take Lovenox 100 mg in am / pm.  You should remain on Lovenox for 48 hours after your INR is therapeutic.  Goal INR 2-3.  3.  Review your medications carefully 4.  Report to ER immediately for uncontrolled bleeding, blood in your stool, coughing up blood or passing out episodes.     Increase activity slowly    Complete by:  As directed                 Follow-up Information    Follow up with VYAS,[name] B., MD On 09/18/2014.   Specialty:  Internal Medicine   Why:  Appt at 1:30 with INR check    Contact information:   [place] [address] [phone]       Follow up with PARRETT,TAMMY, NP On 10/06/2014.   Specialty:  Nurse Practitioner  Why:  Appt at 10:30 AM.  Office will call you with an appointment for a PET scan.  This should be completed before your appointment.    Contact information:   [address] [address] [phone]           Medication List    TAKE these medications        ALPHA LIPOIC ACID PO  Take 200 mg by mouth 2 (two) times daily.     clotrimazole-betamethasone cream  Commonly known as:  LOTRISONE  Apply 1 application topically 2 (two) times daily.     diphenhydramine-acetaminophen 25-500 MG Tabs  Commonly known as:  TYLENOL PM  Take 1 tablet by mouth at bedtime as needed (sleep).     enoxaparin 100 MG/ML injection  Commonly known as:  LOVENOX  Inject 1 mL (100 mg total) into the skin every 12 (twelve) hours.     gabapentin 300 MG capsule  Commonly known as:  NEURONTIN  Take 3 capsules (900 mg total) by mouth 2 (two) times daily.     hydrocortisone 2.5 % rectal cream  Commonly known as:  ANUSOL-HC  Place 1 application rectally 2 (two) times daily.     ibuprofen 200 MG tablet  Commonly known as:  ADVIL,MOTRIN  Take 400 mg by mouth at bedtime as needed for pain.     METAMUCIL PO  Take 15 mLs by mouth daily.     omeprazole 20 MG tablet  Commonly known as:  PRILOSEC OTC  Take 20 mg by mouth daily.     tamsulosin 0.4 MG Caps capsule  Commonly known as:  FLOMAX  Take 1 capsule (0.4 mg total) by mouth daily.     traZODone 50 MG tablet  Commonly known as:  DESYREL  Take 50 mg by mouth at bedtime.     vitamin B-12 1000 MCG tablet  Commonly known as:  CYANOCOBALAMIN  Take 1,000 mcg by mouth daily.     vitamin E 400 UNIT capsule  Take 400 Units by mouth daily.     warfarin 5 MG tablet  Commonly known as:  COUMADIN  Take 7.5 mg at [facility] on 5/13 & 5/14.  Take 5 mg at [facility] on 5/15 then follow up with your primary MD on 5/16 for INR and further adjustment.          Disposition: Home.    Discharged Condition: Charles [name] has met maximum benefit of inpatient care and is medically stable and cleared for discharge.  Patient is pending follow up as above.      Time spent on disposition:  Greater than 35 minutes.   Signed: Noe Gens, NP-C [place] [facility] Pgr: [phone] Office: [phone]

## 2014-09-15 NOTE — Progress Notes (Signed)
Medicare Important Message given?  YES (If response is "NO", the following Medicare IM given date fields will be blank) Date Medicare IM given:  09/15/14 Medicare IM given by:  Tomi Bamberger

## 2014-09-25 ENCOUNTER — Ambulatory Visit (HOSPITAL_COMMUNITY)
Admission: RE | Admit: 2014-09-25 | Discharge: 2014-09-25 | Disposition: A | Discharge status: Home / Self Care | Payer: Medicare Other | Source: Ambulatory Visit | Attending: Pulmonary Disease | Admitting: Pulmonary Disease

## 2014-09-25 DIAGNOSIS — Z923 Personal history of irradiation: Secondary | ICD-10-CM | POA: Diagnosis not present

## 2014-09-25 DIAGNOSIS — R911 Solitary pulmonary nodule: Secondary | ICD-10-CM | POA: Insufficient documentation

## 2014-09-25 DIAGNOSIS — Z85048 Personal history of other malignant neoplasm of rectum, rectosigmoid junction, and anus: Secondary | ICD-10-CM | POA: Diagnosis not present

## 2014-09-25 DIAGNOSIS — Z86711 Personal history of pulmonary embolism: Secondary | ICD-10-CM | POA: Insufficient documentation

## 2014-09-25 DIAGNOSIS — I2699 Other pulmonary embolism without acute cor pulmonale: Secondary | ICD-10-CM

## 2014-09-25 LAB — GLUCOSE, CAPILLARY: Glucose-Capillary: 99 mg/dL (ref 65–99)

## 2014-09-25 MED ORDER — FLUDEOXYGLUCOSE F - 18 (FDG) INJECTION
11.3600 | Freq: Once | INTRAVENOUS | Status: AC | PRN
  Administered 2014-09-25: 11.36 via INTRAVENOUS

## 2014-09-26 ENCOUNTER — Telehealth: Payer: Self-pay | Admitting: Pulmonary Disease

## 2014-09-26 NOTE — Telephone Encounter (Signed)
Within next 2 weeks - prefer in office

## 2014-09-26 NOTE — Telephone Encounter (Signed)
Lung nodule did light up.    Proceed with PFTs- to assess if this nodule can be surgically removed eventually, once he is recovered from blood clots   Spoke with pt and wife, they are aware of results/recs.  States that they are not scheduled for a pft.    Dr. Elsworth Soho do you prefer this PFT to be done in our office or at the hospital?  Thanks!

## 2014-09-27 NOTE — Telephone Encounter (Signed)
LM with pt wife to schedule PFT.  Will have pt to call to schedule this when he gets home.  Will await call back.

## 2014-09-27 NOTE — Telephone Encounter (Signed)
Pt called & sched pft.

## 2014-10-06 ENCOUNTER — Ambulatory Visit (INDEPENDENT_AMBULATORY_CARE_PROVIDER_SITE_OTHER): Payer: Medicare Other | Admitting: Adult Health

## 2014-10-06 ENCOUNTER — Encounter: Payer: Self-pay | Admitting: Adult Health

## 2014-10-06 VITALS — BP 126/82 | HR 66 | Temp 98.6°F | Ht 74.0 in | Wt 226.0 lb

## 2014-10-06 DIAGNOSIS — I2699 Other pulmonary embolism without acute cor pulmonale: Secondary | ICD-10-CM

## 2014-10-06 DIAGNOSIS — R911 Solitary pulmonary nodule: Secondary | ICD-10-CM | POA: Diagnosis not present

## 2014-10-06 NOTE — Patient Instructions (Addendum)
Continue on coumadin as directed with follow up at Primary .  We will call you with PFT appointment  Follow up Dr. Elsworth Soho in 4 weeks and As needed

## 2014-10-06 NOTE — Progress Notes (Signed)
Subjective:    Patient ID: Charles [name], male    DOB: 11-09-1950, 64 y.o.   MRN: [id number]  HPI  64 yo male never smoker  with hx of rectal cancer s/p resection in 2012 in [facility] (completed chemo 05/2011) , prior DVT (off anticoagulation since 2013) admitted 09/08/14 for acute recurrent DVT/PE (will need lifelong anticoagulation) .  Lung nodule   10/06/2014 Post Hospital  follow up  Pt returns for post hospital follow up . Admitted 5/6 to 5/13 for acute PE /DVT  Seen at ER with acute dyspnea.  CTA of the chest at [facility] was positive for an acute PE.   After transfer to [facility], he was evaluated by IR for bilateral DVT's, PE, and what appeared to be a clotted IVC filter.CT of the head was negative for acute intracranial findings. On 5/7 LE venous duplex was positive for DVT in bilateral common femoral, profunda, femoral veins, DVT in right popliteal vein. He was taken to the IR suite 5/9 and had BLE DVT thrombolysis performed. Procedure was successful and pt was then transferred to ICU for close observation. Of note, CT of the chest also revealed incidental finding of a RML 2cm lung nodule and lymphadenopathy, worrisome for bronchogenic metastasis. He was observed in ICU on IV heparin. The patient remained hemodynamically stable and was transitioned out of ICU. He was seen by clinical pharmacy and transitioned from IV heparin to lovenox / coumadin. He was medically cleared for discharge 5/13     Since discharge he feels he is much better. Coumadin going well with no known bleeding Following with PCP for coumadin levels.   Was set up for PET scan on 5/23 that showed hypermetabolic 1.9 x 6.4QI nodule . Equivocal mediastinal nodes. New RUL aspdz +hypermetabolic . Borderline right paratracheal .   We discussed these results, they were very Upset, wife tearful .  Says they did not understand PET results.  Mad that I told them something different .  Explained to  them , this is part of the workup process. That PET scan is not diagnostic that  Will need to get PFT and once they are done can decide on next step .  Also new acute PE on coumadin will need to take this into account as well.  Support and education provided.      Review of Systems Constitutional:   No  weight loss, night sweats,  Fevers, chills, +fatigue, or  lassitude.  HEENT:   No headaches,  Difficulty swallowing,  Tooth/dental problems, or  Sore throat,                No sneezing, itching, ear ache, nasal congestion, post nasal drip,   CV:  No chest pain,  Orthopnea, PND, swelling in lower extremities, anasarca, dizziness, palpitations, syncope.   GI  No heartburn, indigestion, abdominal pain, nausea, vomiting, diarrhea, change in bowel habits, loss of appetite, bloody stools.   Resp: No shortness of breath with exertion or at rest.  No excess mucus, no productive cough,  No non-productive cough,  No coughing up of blood.  No change in color of mucus.  No wheezing.  No chest wall deformity  Skin: no rash or lesions.  GU: no dysuria, change in color of urine, no urgency or frequency.  No flank pain, no hematuria   MS:  No joint pain or swelling.  No decreased range of motion.  No back pain.  Psych:  No change in mood  or affect. No depression or anxiety.  No memory loss.         Objective:   Physical Exam GEN: A/Ox3; pleasant , NAD, well nourished   HEENT:  [place]/AT,  EACs-clear, TMs-wnl, NOSE-clear, THROAT-clear, no lesions, no postnasal drip or exudate noted.   NECK:  Supple w/ fair ROM; no JVD; normal carotid impulses w/o bruits; no thyromegaly or nodules palpated; no lymphadenopathy.  RESP  Clear  P & A; w/o, wheezes/ rales/ or rhonchi.no accessory muscle use, no dullness to percussion  CARD:  RRR, no m/r/g  , no peripheral edema, pulses intact, no cyanosis or clubbing.  GI:   Soft & nt; nml bowel sounds; no organomegaly or masses detected.  Musco: Warm bil, no  deformities or joint swelling noted.   Neuro: alert, no focal deficits noted.    Skin: Warm, no lesions or rashes   PET scan 09/25/14  The right middle lobe pulmonary nodule is hypermetabolic, favored to represent a primary bronchogenic carcinoma. 2. Equivocal mediastinal nodes, similar to surrounding blood pool. 3. Bilateral adrenal hypermetabolism. Absence of CT abnormality on the right, and similarity of an area of mild left adrenal nodularity on the left argues for a physiologic explanation. Small volume metastatic disease, especially to the left adrenal gland, cannot be entirely excluded. 4. New right upper lobe airspace disease with hypermetabolism since 09/08/2014. Most consistent with pneumonia.      Assessment & Plan:

## 2014-10-12 NOTE — Progress Notes (Signed)
He will need CT-guided biopsy of pulmonary nodule. Discussed during multidisciplinary conference. This could be a metastatic colon, less likely primary lung. Lymph nodes are concerning- Airspace disease may represent pulmonary infarct from recent event

## 2014-10-12 NOTE — Assessment & Plan Note (Signed)
PET scan on 5/23 that showed hypermetabolic 1.9 x 1.6XW nodule . Equivocal mediastinal nodes. New RUL aspdz +hypermetabolic . Borderline right paratracheal .  He has hx of rectal cancer .  Will need further workup . PFT set up w/in the week then discuss time frame for bx (acute PE /coumadin recently dx )  Support provided for pt and family

## 2014-10-12 NOTE — Assessment & Plan Note (Signed)
Acute PE with recurrent DVT -extensive s/p catheter directed lysis  Doing well on couamdin  Cont w/ follow up with PCP for INR management.  follow up Dr. Elsworth Soho  In 6 weeks .

## 2014-10-13 ENCOUNTER — Ambulatory Visit (HOSPITAL_COMMUNITY)
Admission: RE | Admit: 2014-10-13 | Discharge: 2014-10-13 | Disposition: A | Discharge status: Home / Self Care | Payer: Medicare Other | Source: Ambulatory Visit | Attending: Pulmonary Disease | Admitting: Pulmonary Disease

## 2014-10-13 DIAGNOSIS — I2699 Other pulmonary embolism without acute cor pulmonale: Secondary | ICD-10-CM | POA: Diagnosis not present

## 2014-10-13 LAB — PULMONARY FUNCTION TEST
DL/VA % pred: 88 %
DL/VA: 4.29 ml/min/mmHg/L
DLCO unc % pred: 82 %
DLCO unc: 31.22 ml/min/mmHg
FEF 25-75 Post: 3.22 L/sec
FEF 25-75 Pre: 2.4 L/sec
FEF2575-%Change-Post: 34 %
FEF2575-%Pred-Post: 102 %
FEF2575-%Pred-Pre: 76 %
FEV1-%Change-Post: 7 %
FEV1-%Pred-Post: 91 %
FEV1-%Pred-Pre: 85 %
FEV1-Post: 3.65 L
FEV1-Pre: 3.4 L
FEV1FVC-%Change-Post: 4 %
FEV1FVC-%Pred-Pre: 96 %
FEV6-%Change-Post: 3 %
FEV6-%Pred-Post: 93 %
FEV6-%Pred-Pre: 89 %
FEV6-Post: 4.73 L
FEV6-Pre: 4.57 L
FEV6FVC-%Change-Post: 0 %
FEV6FVC-%Pred-Post: 102 %
FEV6FVC-%Pred-Pre: 102 %
FVC-%Change-Post: 3 %
FVC-%Pred-Post: 90 %
FVC-%Pred-Pre: 87 %
FVC-Post: 4.84 L
FVC-Pre: 4.69 L
Post FEV1/FVC ratio: 76 %
Post FEV6/FVC ratio: 98 %
Pre FEV1/FVC ratio: 72 %
Pre FEV6/FVC Ratio: 97 %
RV % pred: 186 %
RV: 4.75 L
TLC % pred: 122 %
TLC: 9.56 L

## 2014-10-13 MED ORDER — ALBUTEROL SULFATE (2.5 MG/3ML) 0.083% IN NEBU
2.5000 mg | INHALATION_SOLUTION | Freq: Once | RESPIRATORY_TRACT | Status: AC
  Administered 2014-10-13: 2.5 mg via RESPIRATORY_TRACT

## 2014-10-17 ENCOUNTER — Telehealth: Payer: Self-pay | Admitting: Adult Health

## 2014-10-17 DIAGNOSIS — R911 Solitary pulmonary nodule: Secondary | ICD-10-CM

## 2014-10-17 NOTE — Telephone Encounter (Signed)
Please set up pt for CT guided bx . Has to have lovenox coumadin bridge for procedure  Make sure ov with Dr. Elsworth Soho  After bx to discuss results.

## 2014-10-17 NOTE — Telephone Encounter (Signed)
-----   Message from Rigoberto Noel, MD sent at 10/12/2014  1:34 PM EDT ----- Discussed in [place] Proceed with CT guided biopsy of lung nodule (peripheral ) - Ok to schedule  - will be about 6 wks -so can stop coumadin with lovenox bridge for biopsy  RA ----- Message -----    From: Melvenia Needles, NP    Sent: 10/12/2014  12:07 PM      To: Rigoberto Noel, MD  RA-  After we get PFT , when did you want his to go for Bx PFT are tomorrow  He was new PE/coumadin 5/6 Hx of rectal cancer 2012 w/ chemo  Could go for CT guided -? Nodes was equivocal -might need EBUS for staging. Look to PET to see what you think .  See my note   Tam

## 2014-10-18 ENCOUNTER — Telehealth: Payer: Self-pay | Admitting: Adult Health

## 2014-10-18 ENCOUNTER — Telehealth (HOSPITAL_COMMUNITY): Payer: Self-pay

## 2014-10-18 NOTE — Telephone Encounter (Signed)
Taken care of - see 10/17/14 phone note. Will sign off

## 2014-10-18 NOTE — Telephone Encounter (Signed)
Called spoke with pt's spouse and discussed coumadin/lovenox bridge as outlined by Gay Filler below. Pt has yet to take his coumadin today (typically takes in the afternoon)  Instructed spouse to have pt take his coumadin today as scheduled, tomorrow 6/16 is a 'free day' with no coumadin or lovenox. beginning Friday 6/17, pt will take Lovenox '100mg'$  BID on 6/17, 6/18 and 6/19 On 6/20 (the day before the procedure) pt will ONLY take the morning dose On 6/21 (day of procedure) pt will not take any lovenox or coumadin  Spouse voiced her understanding and repeated these instructions back to me.  Hassan Rowan stated that pt still has Lovenox '100mg'$ /mL at home from his 5/13 discharge - she still has 9 syringes of this left.  Advise will double-check the dosing and call her back.  Called [facility] in [place] and spoke with pharmacist Herbie Baltimore who verified that pt did receive Lovenox '100mg'$ /mL enough for 14 days BID, pt picked it up on 5/19.  The expiration date on this medication is 2018.    Verified with TP that this is okay Called spoke with pt's spouse Hassan Rowan, and informed her to go ahead with the Lovenox at home on 6/17 - she does know how to administer this medication; does not need any additional needles, etc.  However, if pt will need to continue the Lovenox after the procedure rx will need to be sent to the [facility] in [place]. Advised Hassan Rowan will defer to RA for when to restart the anticoagulation therapy after the biopsy.  She is aware will be be in contact.    Dr Elsworth Soho please advise, thank you.

## 2014-10-18 NOTE — Telephone Encounter (Signed)
Pt's wife is calling in regards to pt stopping his Coumadin and starting Lovenox. I do see where a message was sent back in regards to this but I would like to confirm that there is nothing further I need to inform the pt and his wife of. Informed wife we would call back today. Thanks

## 2014-10-18 NOTE — Telephone Encounter (Signed)
Reviewed pt's labs.  CrCl > 100 mL/min.  Weight- 102.7 kg.  Will dose at '1mg'$ /kg BID (every 12 hours)  6/15- Last dose of Coumadin 6/16- No Coumadin or Lovenox 6/17- Lovenox '100mg'$  in AM and PM 6/18- Lovenox '100mg'$  in AM and PM 6/19- Lovenox '100mg'$  in AM and PM 6/20- Lovenox '100mg'$  in AM only 6/21- Date of Procedure (1PM).  Will defer to Dr. Elsworth Soho for when to restart anticoagulation after procedure.  Would take an extra 1/2 tablet of Coumadin for 2 days then resume previous dose.  Continue Lovenox '100mg'$  every 12 hours until INR >2.0.  Suggest recheck INR on 6/24 or 6/27 at the latest

## 2014-10-18 NOTE — Telephone Encounter (Signed)
If biopsy uneventful, can restart coumadin next day with lovenox bridge x 3 more days

## 2014-10-20 NOTE — Telephone Encounter (Addendum)
Called [phone] and spoke with Alecia in scheduling - discussed with Estill Batten about pt needing INR prior to 6/21 biopsy Alecia had me to add the lab to appt desk/appt notes - verified that she did see the need for INR  Called spoke with patient's spouse Hassan Rowan, discussed the above.  Spouse will be advocate for patient and ensure labs are done prior to procedure; will call if INR not done.  Hassan Rowan is aware we will be in touch w/ INR results and recs to restart after biopsy.

## 2014-10-20 NOTE — Telephone Encounter (Signed)
Agree with dr Woody Seller, Definitely recheck INR day of procedure

## 2014-10-20 NOTE — Telephone Encounter (Signed)
Called [facility] and spoke with Gay Filler for additional clarification > pt would restart his coumadin 6/22 ALONG WITH the Lovenox '100mg'$  BID x3 days (so 6/22, 6/23 and 6/24).  Pt would need to follow up with his PCP who checks his INR on either 6/24 or 6/27.  Called spoke with patient's spouse Hassan Rowan.  Hassan Rowan reports that pt saw Dr Woody Seller yesterday and his INR was 5.3.  Dr Woody Seller advised patient to not take any coumadin or Lovenox yesterday 6/16, none today 6/17 and begin the Lovenox '100mg'$  BID tomorrow 6/18 and 6/19 as directed, with the AM dose only on 6/20 (the day prior to the procedure).  Dr Woody Seller requests a PT/INR be done on 6/21 prior to the procedure.  Dr Elsworth Soho please advise, thank you.

## 2014-10-23 ENCOUNTER — Other Ambulatory Visit: Payer: Self-pay | Admitting: Radiology

## 2014-10-23 ENCOUNTER — Telehealth: Payer: Self-pay | Admitting: Pulmonary Disease

## 2014-10-23 NOTE — Telephone Encounter (Signed)
Spoke with pt's wife, states that pt is having biopsy done at 10:30 '@[facility]'$ .   Pt was instructed to stop coumadin on Thursday and Friday, Pt has been on Lovenox injections- 2 inj on Saturday, 2 on Sunday, and 1 today.   Pt's INR is 1.3-is wanting to know if this is ok to proceed with biopsy today.  Pt was told to go back on lovenox as soon as the doctor allows after the biopsy.  Dr. Elsworth Soho are you ok with proceeding with biopsy tomorrow as scheduled?  Thanks!

## 2014-10-23 NOTE — Telephone Encounter (Signed)
Ok to proceed. Do not take lovenox tonight

## 2014-10-23 NOTE — Telephone Encounter (Signed)
Pt's wife is aware of RA's recommendations. Nothing further was needed.

## 2014-10-24 ENCOUNTER — Ambulatory Visit (HOSPITAL_COMMUNITY)
Admission: RE | Admit: 2014-10-24 | Discharge: 2014-10-24 | Disposition: A | Discharge status: Home / Self Care | Payer: Medicare Other | Source: Ambulatory Visit | Attending: Adult Health | Admitting: Adult Health

## 2014-10-24 ENCOUNTER — Encounter (HOSPITAL_COMMUNITY): Payer: Self-pay

## 2014-10-24 ENCOUNTER — Ambulatory Visit (HOSPITAL_COMMUNITY)
Admission: RE | Admit: 2014-10-24 | Discharge: 2014-10-24 | Disposition: A | Discharge status: Home / Self Care | Payer: Medicare Other | Source: Ambulatory Visit | Attending: Interventional Radiology | Admitting: Interventional Radiology

## 2014-10-24 DIAGNOSIS — C342 Malignant neoplasm of middle lobe, bronchus or lung: Secondary | ICD-10-CM | POA: Insufficient documentation

## 2014-10-24 DIAGNOSIS — R911 Solitary pulmonary nodule: Secondary | ICD-10-CM

## 2014-10-24 DIAGNOSIS — Z79899 Other long term (current) drug therapy: Secondary | ICD-10-CM | POA: Diagnosis not present

## 2014-10-24 DIAGNOSIS — Z85048 Personal history of other malignant neoplasm of rectum, rectosigmoid junction, and anus: Secondary | ICD-10-CM | POA: Diagnosis not present

## 2014-10-24 DIAGNOSIS — J95811 Postprocedural pneumothorax: Secondary | ICD-10-CM

## 2014-10-24 DIAGNOSIS — Z7901 Long term (current) use of anticoagulants: Secondary | ICD-10-CM | POA: Diagnosis not present

## 2014-10-24 DIAGNOSIS — Z85038 Personal history of other malignant neoplasm of large intestine: Secondary | ICD-10-CM | POA: Insufficient documentation

## 2014-10-24 DIAGNOSIS — Z86711 Personal history of pulmonary embolism: Secondary | ICD-10-CM | POA: Diagnosis not present

## 2014-10-24 DIAGNOSIS — J939 Pneumothorax, unspecified: Secondary | ICD-10-CM | POA: Diagnosis not present

## 2014-10-24 DIAGNOSIS — I10 Essential (primary) hypertension: Secondary | ICD-10-CM | POA: Insufficient documentation

## 2014-10-24 LAB — CBC
HCT: 42.7 % (ref 39.0–52.0)
Hemoglobin: 14.6 g/dL (ref 13.0–17.0)
MCH: 29.4 pg (ref 26.0–34.0)
MCHC: 34.2 g/dL (ref 30.0–36.0)
MCV: 85.9 fL (ref 78.0–100.0)
Platelets: 235 10*3/uL (ref 150–400)
RBC: 4.97 MIL/uL (ref 4.22–5.81)
RDW: 13 % (ref 11.5–15.5)
WBC: 6 10*3/uL (ref 4.0–10.5)

## 2014-10-24 LAB — PROTIME-INR
INR: 1.06 (ref 0.00–1.49)
Prothrombin Time: 14 seconds (ref 11.6–15.2)

## 2014-10-24 LAB — APTT: aPTT: 32 seconds (ref 24–37)

## 2014-10-24 MED ORDER — FENTANYL CITRATE (PF) 100 MCG/2ML IJ SOLN
INTRAMUSCULAR | Status: AC | PRN
  Administered 2014-10-24 (×4): 25 ug via INTRAVENOUS

## 2014-10-24 MED ORDER — SODIUM CHLORIDE 0.9 % IV SOLN
Freq: Once | INTRAVENOUS | Status: AC
  Administered 2014-10-24: 11:00:00 via INTRAVENOUS

## 2014-10-24 MED ORDER — MIDAZOLAM HCL 2 MG/2ML IJ SOLN
INTRAMUSCULAR | Status: AC | PRN
  Administered 2014-10-24 (×5): 1 mg via INTRAVENOUS

## 2014-10-24 MED ORDER — MIDAZOLAM HCL 2 MG/2ML IJ SOLN
INTRAMUSCULAR | Status: AC
  Filled 2014-10-24: qty 6

## 2014-10-24 MED ORDER — FENTANYL CITRATE (PF) 100 MCG/2ML IJ SOLN
INTRAMUSCULAR | Status: AC
  Filled 2014-10-24: qty 4

## 2014-10-24 NOTE — Discharge Instructions (Signed)
Needle Biopsy of Lung, Care After Refer to this sheet in the next few weeks. These instructions provide you with information on caring for yourself after your procedure. Your health care provider may also give you more specific instructions. Your treatment has been planned according to current medical practices, but problems sometimes occur. Call your health care provider if you have any problems or questions after your procedure. WHAT TO EXPECT AFTER THE PROCEDURE  A bandage will be applied over the area where the needle was inserted. You may be asked to apply pressure to the bandage for several minutes to ensure there is minimal bleeding.  In most cases, you can leave when your needle biopsy procedure is completed. Do not drive yourself home. Someone else should take you home.  If you received an IV sedative or general anesthetic, you will be taken to a comfortable place to relax while the medicine wears off.  If you have upcoming travel scheduled, talk to your health care provider about when it is safe to travel by air after the procedure. HOME CARE INSTRUCTIONS  Expect to take it easy for the rest of the day.  Protect the area where you received the needle biopsy by keeping the bandage in place for as long as instructed.  You may feel some mild pain or discomfort in the area, but this should stop in a day or two.  Take medicines only as directed by your health care provider. SEEK MEDICAL CARE IF:   You have pain at the biopsy site that worsens or is not helped by medicine.  You have swelling or drainage at the needle biopsy site.  You have a fever. SEEK IMMEDIATE MEDICAL CARE IF:   You have new or worsening shortness of breath.  You have chest pain.  You are coughing up blood.  You have bleeding that does not stop with pressure or a bandage.  You develop light-headedness or fainting. Document Released: 02/16/2007 Document Revised: 09/05/2013 Document Reviewed:  09/13/2012 [facility] Patient Information 2015 [place], [place]. This information is not intended to replace advice given to you by your health care provider. Make sure you discuss any questions you have with your health care provider. Lung Biopsy A lung biopsy is a procedure in which a tissue sample is removed from the lung. The tissue can be examined under a microscope to help diagnose various lung disorders.  LET [facility] CARE PROVIDER KNOW ABOUT:  Any allergies you have.  All medicines you are taking, including vitamins, herbs, eye drops, creams, and over-the-counter medicines.  Previous problems you or members of your family have had with the use of anesthetics.  Any blood disorders or bleeding problems that you have.  Previous surgeries you have had.  Medical conditions you have. RISKS AND COMPLICATIONS Generally, a lung biopsy is a safe procedure. However, problems can occur and include:  Collapse of the lung.   Bleeding.   Infection.  BEFORE THE PROCEDURE  Do not eat or drink anything after midnight on the night before the procedure or as directed by your health care provider.  Ask your health care provider about changing or stopping your regular medicines. This is especially important if you are taking diabetes medicines or blood thinners.  Plan to have someone take you home after the procedure. PROCEDURE Various methods can be used to perform a lung biopsy:   Needle biopsy. A biopsy needle is inserted into the lung. The needle is used to collect the tissue sample. A CT  scanner may be used to guide the needle to the right place in the lung. For this method, a medicine is used to numb the area where the biopsy sample will be taken (local anesthetic).  Bronchoscopy. A flexible tube (bronchoscope) is inserted into your lungs by going through your mouth or nose. A needle or forceps is passed through the bronchoscope to remove the tissue sample. For this method, medicine  may be used to numb the back of your throat.  Open biopsy. A cut (incision) is made in your chest. The tissue sample is then removed using surgical tools. The incision is closed with skin glue, skin adhesive strips, or stitches. For this method, you will be given medicine to make you sleep through the procedure (general anesthetic). AFTER THE PROCEDURE  Your recovery will be assessed and monitored.  You might have soreness and tenderness at the site of the biopsy for a few days after the procedure.  You might have a cough and some soreness in your throat for a few days if a bronchoscope was used. Document Released: 07/10/2004 Document Revised: 09/05/2013 Document Reviewed: 10/03/2012 [facility] Patient Information 2015 [place], [place]. This information is not intended to replace advice given to you by your health care provider. Make sure you discuss any questions you have with your health care provider. Conscious Sedation, Adult, Care After Refer to this sheet in the next few weeks. These instructions provide you with information on caring for yourself after your procedure. Your health care provider may also give you more specific instructions. Your treatment has been planned according to current medical practices, but problems sometimes occur. Call your health care provider if you have any problems or questions after your procedure. WHAT TO EXPECT AFTER THE PROCEDURE  After your procedure:  You may feel sleepy, clumsy, and have poor balance for several hours.  Vomiting may occur if you eat too soon after the procedure. HOME CARE INSTRUCTIONS  Do not participate in any activities where you could become injured for at least 24 hours. Do not:  Drive.  Swim.  Ride a bicycle.  Operate heavy machinery.  Cook.  Use power tools.  Climb ladders.  Work from a high place.  Do not make important decisions or sign legal documents until you are improved.  If you vomit, drink water, juice,  or soup when you can drink without vomiting. Make sure you have little or no nausea before eating solid foods.  Only take over-the-counter or prescription medicines for pain, discomfort, or fever as directed by your health care provider.  Make sure you and your family fully understand everything about the medicines given to you, including what side effects may occur.  You should not drink alcohol, take sleeping pills, or take medicines that cause drowsiness for at least 24 hours.  If you smoke, do not smoke without supervision.  If you are feeling better, you may resume normal activities 24 hours after you were sedated.  Keep all appointments with your health care provider. SEEK MEDICAL CARE IF:  Your skin is pale or bluish in color.  You continue to feel nauseous or vomit.  Your pain is getting worse and is not helped by medicine.  You have bleeding or swelling.  You are still sleepy or feeling clumsy after 24 hours. SEEK IMMEDIATE MEDICAL CARE IF:  You develop a rash.  You have difficulty breathing.  You develop any type of allergic problem.  You have a fever. MAKE SURE YOU:  Understand these  instructions.  Will watch your condition.  Will get help right away if you are not doing well or get worse. Document Released: 02/09/2013 Document Reviewed: 02/09/2013 [facility] Patient Information 2015 [place], [place]. This information is not intended to replace advice given to you by your health care provider. Make sure you discuss any questions you have with your health care provider.

## 2014-10-24 NOTE — H&P (Signed)
Chief Complaint: "I'm having a lung biopsy"  Referring Physician(s): Parrett,Tammy S  History of Present Illness: Charles [name] is a 64 y.o. male , nonsmoker, with history of rectal cancer 2012, prior bilateral DVT/PE-IVC filter placement, recently treated pneumonia and PET scan revealing a hypermetabolic right middle lobe pulmonary nodule. He presents today for CT-guided biopsy of the right middle lobe lung nodule.  Past Medical History  Diagnosis Date  . HTN (hypertension)   . Pulmonary embolism   . GERD (gastroesophageal reflux disease)   . Depression 04/01/2011  . DVT (deep venous thrombosis) 05/09/2011  . 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 Colon cancer 07/24/10    rectal ca, inv adenocarcinoma  . Hx of radiation therapy 09/02/10 to 10/14/10    pelvis  . Rectal cancer 01/15/2011    S/P radiation and concurrent 5-FU continuous infusion from 09/09/10- 10/10/10.  S/P proctectomy with colorectal anastomosis and diverting loop ileostomy on 11/14/10 at [facility] by Dr. Harlon Ditty. Pathology reveals a pT3b N1 with 3/20 lymph nodes.      Past Surgical History  Procedure Laterality Date  . Esophagogastroduodenoscopy  07/2010    RMR: schatki ring s/p dilation, small hh, SB bx benign  . Colonoscopy  07/2010    proximal rectal apple core mass 10-14cm from anal verge (adenocarcinoma), 2-3cm distal rectal carpet polyp s/p piecemeal snare polypectomy (adenoma)  . Eus  08/2010    Dr. Owens Loffler. uT3N0 circumferential, nearly obstruction rectosigmoid adenocarcinoma, distal edge 12cm from anal verge  . Ivc filter    . Colon surgery  11/14/2010    proctectomy with colorectal anastomosis and diverting loop ileostomy (temporary planned)  . Port a cath placement    . Colostomy reversal  april 2013  . Port-a-cath removal  09/24/2011    Procedure: REMOVAL PORT-A-CATH;  Surgeon: Donato Heinz, MD;  Location: [facility];  Service: General;  Laterality: N/A;  Minor Room  . Ivc filter    .  Colonoscopy  04/21/2012    RMR: Friable,fibrotic appearing colorectal anastomosis producing some luminal narrowing-not felt to be critical. path: focal erosion with slight inflammation and hyperemia. SURVEILLANCE DUE DEC 2015  . Colonoscopy N/A 11/24/2013    Dr. Rourk:somewhat fibrotic/friable anastomotic mucosal-status post biopsy (narrowing not felt to be clinically significant) Single colonic diverticulum. benign polypoid rectal mucosa    Allergies: Trazodone and nefazodone  Medications: Prior to Admission medications   Medication Sig Start Date End Date Taking? Authorizing Provider  clonazePAM (KLONOPIN) 0.5 MG tablet Take 0.5 mg by mouth 2 (two) times daily as needed for anxiety.    Yes Historical Provider, MD  diphenhydramine-acetaminophen (TYLENOL PM) 25-500 MG TABS Take 1 tablet by mouth at bedtime as needed (sleep).    Yes Historical Provider, MD  enoxaparin (LOVENOX) 100 MG/ML injection Inject 100 mg into the skin. Started Saturday for coumadin bridge   Yes Historical Provider, MD  finasteride (PROSCAR) 5 MG tablet Take 5 mg by mouth daily.   Yes Historical Provider, MD  gabapentin (NEURONTIN) 300 MG capsule Take 3 capsules (900 mg total) by mouth 2 (two) times daily. 09/15/14  Yes Donita Brooks, NP  omeprazole (PRILOSEC OTC) 20 MG tablet Take 20 mg by mouth daily.   Yes Historical Provider, MD  Psyllium (METAMUCIL PO) Take 15 mLs by mouth daily.    Yes Historical Provider, MD  tamsulosin (FLOMAX) 0.4 MG CAPS capsule Take 1 capsule (0.4 mg total) by mouth daily. 09/15/14  Yes Donita Brooks, NP  vitamin B-12 (CYANOCOBALAMIN) 1000 MCG tablet Take 1,000 mcg by mouth daily.   Yes Historical Provider, MD  vitamin E 400 UNIT capsule Take 400 Units by mouth daily.   Yes Historical Provider, MD  warfarin (COUMADIN) 5 MG tablet Take 7.5 mg at [place] on 5/13 & 5/14.  Take 5 mg at [facility] on 5/15 then follow up with your primary MD on 5/16 for INR and further adjustment. Patient taking differently:  Take 5-7.5 mg by mouth daily. Take 7.5 mg every day except Friday take '5mg'$  09/15/14  Yes Donita Brooks, NP    Family History  Problem Relation Age of Onset  . Colon cancer Neg Hx   . Liver disease Neg Hx   . Inflammatory bowel disease Neg Hx   . Cancer Brother     throat  . Cancer Brother     prostate    History   Social History  . Marital Status: Married    Spouse Name: N/A  . Number of Children: 2  . Years of Education: N/A   Occupational History  . self-employed Regulatory affairs officer, currently not working    Social History Main Topics  . Smoking status: Never Smoker   . Smokeless tobacco: Never Used  . Alcohol Use: No  . Drug Use: No  . Sexual Activity: Not on file   Other Topics Concern  . None   Social History Narrative      Review of Systems  Constitutional: Negative for fever and chills.  Respiratory: Negative for cough and shortness of breath.   Cardiovascular: Negative for chest pain.  Gastrointestinal: Negative for nausea, vomiting, abdominal pain and blood in stool.  Genitourinary: Negative for dysuria and hematuria.  Musculoskeletal: Negative for back pain.  Neurological: Negative for headaches.    Vital Signs: BP 148/81 mmHg  Pulse 70  Temp(Src) 98.4 F (36.9 C) (Oral)  Resp 16  SpO2 97%  Physical Exam  Constitutional: He is oriented to person, place, and time. He appears well-developed and well-nourished.  Cardiovascular: Normal rate and regular rhythm.   Pulmonary/Chest: Effort normal.  Few fine right basilar crackles, left clear  Abdominal: Soft. Bowel sounds are normal.  Musculoskeletal: Normal range of motion.  Trace bilateral pretibial edema  Neurological: He is alert and oriented to person, place, and time.    Imaging: Nm Pet Image Initial (pi) Skull Base To Thigh  09/25/2014   CLINICAL DATA:  Initial Treatment strategy for right middle lobe lung nodule. History of rectal cancer 07/24/2010. Radiation therapy in 2012.  Recent pulmonary embolism.  EXAM: NUCLEAR MEDICINE PET SKULL BASE TO THIGH  TECHNIQUE: 11.4 mCi F-18 FDG was injected intravenously. Full-ring PET imaging was performed from the skull base to thigh after the radiotracer. CT data was obtained and used for attenuation correction and anatomic localization.  FASTING BLOOD GLUCOSE:  Value: 99 mg/dl  COMPARISON:  Chest abdomen and pelvic CTs of 09/08/2014. PET of 08/12/2010.  FINDINGS: NECK  No areas of abnormal hypermetabolism.  CHEST  Posterior right upper lobe patchy hypermetabolic airspace disease, including on image 31 of series 6. This is new since 09/08/2014, and measures a S.U.V. max of 6.0.  Hypermetabolic relatively well-circumscribed right middle lobe pulmonary nodule. 1.9 x 1.6 cm and a S.U.V. max of 3.3 on image 43.  High right paratracheal node is borderline to minimally enlarged at 8 mm. Measures a S.U.V. max of 2.6. This is similar to the mediastinal pool. Similarly, subcarinal node measures 9 mm and a S.U.V. max  of 2.7 on image 81.  ABDOMEN/PELVIS  Right adrenal hypermetabolism is without CT correlate. Measures a S.U.V. max of 3.5.  Left adrenal hypermetabolism corresponds to minimal nodularity, on the order of 9 mm. This measures a S.U.V. max of 4.6, including on image 126. This left adrenal nodularity is felt to be similar to 08/12/2010. The hypermetabolism is new. medial limb left adrenal calcifications which are chronic.  SKELETON  No abnormal marrow activity.  CT IMAGES PERFORMED FOR ATTENUATION CORRECTION  No cervical adenopathy.  Chest, abdomen, and pelvic findings otherwise deferred to recent diagnostic CT. 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 LAD coronary artery atherosclerosis. IVC filter.  IMPRESSION: 1. The right middle lobe pulmonary nodule is hypermetabolic, favored to represent a primary bronchogenic carcinoma. 2. Equivocal mediastinal nodes, similar to surrounding blood pool. 3. Bilateral adrenal  hypermetabolism. Absence of CT abnormality on the right, and similarity of an area of mild left adrenal nodularity on the left argues for a physiologic explanation. Small volume metastatic disease, especially to the left adrenal gland, cannot be entirely excluded. 4. New right upper lobe airspace disease with hypermetabolism since 09/08/2014. Most consistent with pneumonia.   Electronically Signed   By: Abigail Miyamoto M.D.   On: 09/25/2014 12:25    Labs:  CBC:  Recent Labs  09/13/14 0500 09/14/14 0554 09/15/14 0437 10/24/14 1056  WBC 6.2 5.6 5.6 6.0  HGB 12.3* 12.6* 13.2 14.6  HCT 36.3* 36.9* 38.5* 42.7  PLT 152 156 179 235    COAGS:  Recent Labs  09/08/14 1510 09/11/14 1040 09/14/14 1455 09/15/14 0437 10/24/14 1056  INR 1.15 1.07 1.06 1.00 1.06  APTT 42* 86*  --   --  32    BMP:  Recent Labs  09/11/14 0235 09/12/14 0014 09/14/14 0554 09/15/14 0437  NA 140 144 140 140  K 3.4* 3.6 3.8 3.8  CL 109 109 110 107  CO2 '24 25 22 23  '$ GLUCOSE 119* 109* 131* 136*  BUN 7 7 5* 9  CALCIUM 8.4* 8.6* 8.2* 8.8*  CREATININE 0.79 0.69 0.69 0.79  GFRNONAA >60 >60 >60 >60  GFRAA >60 >60 >60 >60    LIVER FUNCTION TESTS:  Recent Labs  07/12/14 0901 09/09/14 0455 09/10/14 0305 09/11/14 0235  BILITOT 1.2 1.4* 1.1 1.3*  AST '16 17 16 25  '$ ALT 17 15* 15* 22  ALKPHOS 78 56 60 56  PROT 6.7 5.9* 5.3* 5.3*  ALBUMIN 3.9 3.1* 3.0* 2.9*    TUMOR MARKERS:  Recent Labs  01/11/14 7001 07/12/14 0901  CEA 1.2 1.9    Assessment/plan: JERELD PRESTI is a 64 y.o. male , nonsmoker, with history of rectal cancer 2012, prior bilateral DVT/PE-IVC filter placement, recently treated pneumonia and PET scan revealing a hypermetabolic right middle lobe pulmonary nodule. He presents today for CT-guided biopsy of the right middle lobe lung nodule.Risks and benefits discussed with the patient/wife including, but not limited to bleeding, infection, pneumothorax requiring chest tube placement,  damage to adjacent structures , low yield requiring additional tests and death.All of the patient's questions were answered, patient is agreeable to proceed.Consent signed and in chart.      Signed: D. Rowe Robert 10/24/2014, 12:37 PM   I spent a total of 20 minutes face to face in clinical consultation, greater than 50% of which was counseling/coordinating care for CT-guided right lung mass biopsy.

## 2014-10-24 NOTE — Procedures (Signed)
Interventional Radiology Procedure Note  Procedure: CT guided biopsy of right middle lobe nodule. 3 x 25Q core Complications: Small ptx.  SIR category 1  Recommendations: - Bedrest until CXR cleared.  Minimize talking, coughing or otherwise straining.  - Follow up 1 hr CXR pending   Signed,  Signed,  Abree Romick S. Earleen Newport, DO

## 2014-10-24 NOTE — Progress Notes (Addendum)
Prior to lung biopsy patient on Coumadin (stopped Wed) and transitioned to Lovenox '100mg'$ . Asked Dr. Earleen Newport about resuming anticoagulation post biopsy. MD stated to resume Lovenox tonight (same dose as taken as this am '100mg'$ ) and to call physician that manages coumadin in am for further orders.  Wife informed of above and verbalizes understanding.

## 2014-10-24 NOTE — Progress Notes (Signed)
2nd xray results seen by Dr.Wagner, he states pt is okay to proceed with previously ordered d/c time of 1655.  Dr. Earleen Newport states to have pt get up and walk around to make sure he isn't symptomatic post procedure.  Had pt ambulate to restroom, pt voided, and then pt ambulated back to room.  No SOB or dizziness reported by pt.  Pt states he is ready to go home.

## 2014-10-26 ENCOUNTER — Telehealth: Payer: Self-pay | Admitting: Pulmonary Disease

## 2014-10-26 ENCOUNTER — Other Ambulatory Visit (HOSPITAL_COMMUNITY): Payer: Self-pay | Admitting: Oncology

## 2014-10-26 NOTE — Telephone Encounter (Signed)
Called spoke with [place]. She reports [facility] called her and scheduled an appt for pt to be seen by them on Monday at 8:30. When she asked them what the appt was for they told her they did not know but the doctors office wanted pt to be seen.  I do not see a referral in our system for this. She is wanting pt test results from [place]. Please advise RA thanks

## 2014-10-26 NOTE — Telephone Encounter (Signed)
Results faxed to Dr. Woody Seller. Nothing further needed.

## 2014-10-26 NOTE — Telephone Encounter (Signed)
Discussed results with pts wife APPt made to [facility] Please send results to Dr Woody Seller

## 2014-10-30 ENCOUNTER — Encounter (HOSPITAL_COMMUNITY): Payer: Medicare Other | Attending: Hematology & Oncology | Admitting: Hematology & Oncology

## 2014-10-30 ENCOUNTER — Encounter (HOSPITAL_COMMUNITY): Payer: Self-pay | Admitting: Hematology & Oncology

## 2014-10-30 VITALS — BP 153/87 | HR 77 | Temp 97.9°F | Resp 18 | Wt 225.0 lb

## 2014-10-30 DIAGNOSIS — C7800 Secondary malignant neoplasm of unspecified lung: Secondary | ICD-10-CM

## 2014-10-30 DIAGNOSIS — C20 Malignant neoplasm of rectum: Secondary | ICD-10-CM | POA: Diagnosis not present

## 2014-10-30 DIAGNOSIS — I2699 Other pulmonary embolism without acute cor pulmonale: Secondary | ICD-10-CM

## 2014-10-30 NOTE — Patient Instructions (Addendum)
[place] at [facility] Discharge Instructions  RECOMMENDATIONS MADE BY THE CONSULTANT AND ANY TEST RESULTS WILL BE SENT TO YOUR REFERRING PHYSICIAN.  Exam and discussion today with Dr. Whitney Muse. Referral made to Dr. Verlene Mayer regarding pulmonary nodule (their office will call you with appointment date and time). Return to clinic for follow-up visit in 2 weeks.   Thank you for choosing [place] at [facility] to provide your oncology and hematology care.  To afford each patient quality time with our provider, please arrive at least 15 minutes before your scheduled appointment time.    You need to re-schedule your appointment should you arrive 10 or more minutes late.  We strive to give you quality time with our providers, and arriving late affects you and other patients whose appointments are after yours.  Also, if you no show three or more times for appointments you may be dismissed from the clinic at the providers discretion.     Again, thank you for choosing [facility].  Our hope is that these requests will decrease the amount of time that you wait before being seen by our physicians.       _____________________________________________________________  Should you have questions after your visit to [facility], please contact our office at [phone] between the hours of 8:30 a.m. and 4:30 p.m.  Voicemails left after 4:30 p.m. will not be returned until the following business day.  For prescription refill requests, have your pharmacy contact our office.

## 2014-10-30 NOTE — Progress Notes (Signed)
Charles [name]., MD [place] [id number]  No diagnosis found.  CURRENT THERAPY: Surveillance per NCCN guidelines  INTERVAL HISTORY: Charles [name] 64 y.o. male returns for followup of stage III cancer of the rectum status post preoperative radiation and chemotherapy followed by definitive surgery. He then underwent adjuvant chemotherapy with FOLFOX. During the radiation therapy and continuous infusion 5-FU from 09/09/2010 through 10/10/2010. FOLFOX was administered from October 2012 through 07/14/2011.   He was recently admitted for acute DVT and PE. As a prior history of DVT having been off anticoagulation since 2013. He initially presented to [facility] and CT imaging was performed there. CT imaging showed multi segmental PE in the right upper lobe, a 2 cm right middle lobe nodule was also noted. He underwent PET CT on 09/25/2014 that showed hypermetabolism in the right middle lobe pulmonary nodule, equivocal mediastinal nodes, bilateral adrenal hypermetabolism with questionable mild left adrenal nodularity.  He is here with his wife and daughter and presents today to discuss his recently completed scans and diagnosis. His biopsy was completed last Tuesday, his results were given to him by phone by Pulmonary Doctor, Dr. Elsworth Soho.  He has a good appetite, good energy, and no unintentional weight loss.  He has been working, Garment/textile technologist and hardwood. PS is excellent.    Rectal cancer   07/24/2010 Initial Diagnosis Invasive adenocarcinoma of rectum   09/09/2010 Concurrent Chemotherapy S/P radiation and concurrent 5-FU continuous infusion from 09/09/10- 10/10/10.   11/14/2010 Surgery S/P proctectomy with colorectal anastomosis and diverting loop ileostomy on 11/14/10 at [facility] by Dr. Harlon Ditty. Pathology reveals a pT3b N1 with 3/20 lymph nodes.   02/05/2011 - 07/14/2011 Chemotherapy FOLFOX   08/18/2011 Surgery Approximate date of surgery- [place] by Dr. Harlon Ditty    Remission    11/24/2013 Survivorship Colonoscopy- somewhat fibrotic/friable anastomotic mucosal-status post biopsy (narrowing not felt to be clinically significant).  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 The right iliac venous system is patent and do   09/25/2014 PET scan The right middle lobe pulmonary nodule is hypermetabolic, favored to represent a primary bronchogenic carcinoma.Equivocal mediastinal nodes, similar to surrounding blood pool. Bilateral adrenal hypermetabolism, felt to be physiologic   10/24/2014 Pathology Results Lung, needle/core biopsy(ies), RML - ADENOCARCINOMA, SEE COMMENT metastatic adenocarcinoma of a colorectal primary    Past Medical History  Diagnosis Date   HTN (hypertension)    Pulmonary embolism    GERD (gastroesophageal reflux disease)    Depression 04/01/2011   DVT (deep venous thrombosis) 05/09/2011   High output ileostomy 05/21/2011   Colon cancer 07/24/10    rectal ca, inv adenocarcinoma   Hx of radiation therapy 09/02/10 to 10/14/10    pelvis   Rectal cancer 01/15/2011    S/P radiation and concurrent 5-FU continuous infusion from 09/09/10- 10/10/10.  S/P proctectomy with colorectal anastomosis and diverting loop ileostomy on 11/14/10 at [facility] by Dr. Harlon Ditty. Pathology reveals a pT3b N1 with 3/20 lymph nodes.      has GERD; Rectal cancer; Hx of radiation therapy; Stiffness of joint, not elsewhere classified, ankle and foot; Weakness of both legs; Peripheral neuropathy due to oxaliplatin-chemotherapy; Diarrhea; Acute pulmonary embolism; Lactic acidosis; Lower abdominal pain; Urinary retention; Lung nodule, solitary; DVT (deep venous thrombosis);  and Lung nodule on his problem list.     is allergic to trazodone and  nefazodone.  Mr. Gosch does not currently have medications on file.  Past Surgical History  Procedure Laterality Date   Esophagogastroduodenoscopy  07/2010    RMR: schatki ring s/p dilation, small hh, SB bx benign   Colonoscopy  07/2010    proximal rectal apple core mass 10-14cm from anal verge (adenocarcinoma), 2-3cm distal rectal carpet polyp s/p piecemeal snare polypectomy (adenoma)   Eus  08/2010    Dr. Owens Loffler. uT3N0 circumferential, nearly obstruction rectosigmoid adenocarcinoma, distal edge 12cm from anal verge   Ivc filter     Colon surgery  11/14/2010    proctectomy with colorectal anastomosis and diverting loop ileostomy (temporary planned)   Port a cath placement     Colostomy reversal  april 2013   Port-a-cath removal  09/24/2011    Procedure: REMOVAL PORT-A-CATH;  Surgeon: Donato Heinz, MD;  Location: [facility];  Service: General;  Laterality: N/A;  Minor Room   Ivc filter     Colonoscopy  04/21/2012    RMR: Friable,fibrotic appearing colorectal anastomosis producing some luminal narrowing-not felt to be critical. path: focal erosion with slight inflammation and hyperemia. SURVEILLANCE DUE DEC 2015   Colonoscopy N/A 11/24/2013    Dr. Rourk:somewhat fibrotic/friable anastomotic mucosal-status post biopsy (narrowing not felt to be clinically significant) Single colonic diverticulum. benign polypoid rectal mucosa    Denies any headaches, dizziness, double vision, fevers, chills, night sweats, nausea, vomiting, diarrhea, constipation, chest pain, heart palpitations, shortness of breath, blood in stool, black tarry stool, urinary pain, urinary burning, urinary frequency, hematuria.   PHYSICAL EXAMINATION  ECOG PERFORMANCE STATUS: 0 - Asymptomatic  Filed Vitals:   10/30/14 0843  BP: 153/87  Pulse: 77  Temp: 97.9 F (36.6 C)  Resp: 18    GENERAL:alert, no distress, well nourished, well developed, comfortable, cooperative, obese and smiling, unaccompanied    SKIN: skin color, texture, turgor are normal, no rashes or significant lesions HEAD: Normocephalic, No masses, lesions, tenderness or abnormalities EYES: normal, PERRLA, EOMI, Conjunctiva are pink and non-injected EARS: External ears normal OROPHARYNX:lips, buccal mucosa, and tongue normal and mucous membranes are moist  NECK: supple, no adenopathy, thyroid normal size, non-tender, without nodularity, no stridor, non-tender, trachea midline LYMPH:  no palpable lymphadenopathy, no hepatosplenomegaly BREAST:not examined LUNGS: clear to auscultation and percussion HEART: regular rate & rhythm, no murmurs, no gallops, S1 normal and S2 normal ABDOMEN:abdomen soft, non-tender, obese, normal bowel sounds, no masses or organomegaly and no hepatosplenomegaly BACK: Back symmetric, no curvature., No CVA tenderness EXTREMITIES:less then 2 second capillary refill, no joint deformities, effusion, or inflammation, no edema, no skin discoloration, no clubbing, no cyanosis  NEURO: alert & oriented x 3 with fluent speech, no focal motor/sensory deficits, gait normal  LABORATORY DATA: CBC    Component Value Date/Time   WBC 6.0 10/24/2014 1056   WBC 6.5 02/04/2011 1059   RBC 4.97 10/24/2014 1056   RBC 4.72 05/21/2012 1015   RBC 4.57 02/04/2011 1059   HGB 14.6 10/24/2014 1056   HGB 11.6* 02/04/2011 1059   HCT 42.7 10/24/2014 1056   HCT 34.7* 02/04/2011 1059   PLT 235 10/24/2014 1056   PLT 282 02/04/2011 1059   MCV 85.9 10/24/2014 1056   MCV 76.0* 02/04/2011 1059   MCH 29.4 10/24/2014 1056   MCH 25.4* 02/04/2011 1059   MCHC 34.2 10/24/2014 1056   MCHC 33.5 02/04/2011 1059   RDW 13.0 10/24/2014 1056  RDW 18.5* 02/04/2011 1059   LYMPHSABS 2.2 09/11/2014 0235   LYMPHSABS 1.1 02/04/2011 1059   MONOABS 0.6 09/11/2014 0235   MONOABS 0.5 02/04/2011 1059   EOSABS 0.2 09/11/2014 0235   EOSABS 0.1 02/04/2011 1059   BASOSABS 0.0 09/11/2014 0235   BASOSABS 0.1 02/04/2011 1059      Chemistry       Component Value Date/Time   NA 140 09/15/2014 0437   NA 136 10/30/2010 0801   K 3.8 09/15/2014 0437   K 4.0 10/30/2010 0801   CL 107 09/15/2014 0437   CL 101 10/30/2010 0801   CO2 23 09/15/2014 0437   CO2 27 10/30/2010 0801   BUN 9 09/15/2014 0437   BUN 9 10/30/2010 0801   CREATININE 0.79 09/15/2014 0437   CREATININE 0.83 02/10/2011 1037      Component Value Date/Time   CALCIUM 8.8* 09/15/2014 0437   CALCIUM 8.9 10/30/2010 0801   ALKPHOS 56 09/11/2014 0235   ALKPHOS 89* 10/30/2010 0801   AST 25 09/11/2014 0235   AST 17 10/30/2010 0801   ALT 22 09/11/2014 0235   ALT 13 10/30/2010 0801   BILITOT 1.3* 09/11/2014 0235   BILITOT 1.00 10/30/2010 0801     Lab Results  Component Value Date   CEA 1.9 07/12/2014   REPORT OF SURGICAL PATHOLOGY PATHOLOGY: L DIAGNOSIS Diagnosis Lung, needle/core biopsy(ies), RML - ADENOCARCINOMA, SEE COMMENT. Microscopic Comment The majority of the tissue is necrotic with only a small focus of malignant glands. Immunohistochemistry reveals the tumor is positive for cytokeratin 20 and CDX2. It is negative for cytokeratin 7, TTF-1, and NapsinA. These findings are consistent with metastatic adenocarcinoma of a colorectal primary. There is insufficient tissue for molecular testing. Dr. Avis Epley has reviewed the case. The case was discussed with Dr. Whitney Muse on 10/26/2014. Vicente Males MD Pathologist, Electronic Signature (Case signed 10/26/2014) Specimen Gross and Clinical Information Specimen(s) Obtained: Lung, needle/core biopsy(ies), RML Specimen Clinical  RADIOLOGY:  CLINICAL DATA: Initial Treatment strategy for right middle lobe lung nodule. History of rectal cancer 07/24/2010. Radiation therapy in 2012. Recent pulmonary embolism.  EXAM: NUCLEAR MEDICINE PET SKULL BASE TO THIGH  IMPRESSION: 1. The right middle lobe pulmonary nodule is hypermetabolic, favored to represent a primary bronchogenic carcinoma. 2. Equivocal mediastinal  nodes, similar to surrounding blood pool. 3. Bilateral adrenal hypermetabolism. Absence of CT abnormality on the right, and similarity of an area of mild left adrenal nodularity on the left argues for a physiologic explanation. Small volume metastatic disease, especially to the left adrenal gland, cannot be entirely excluded. 4. New right upper lobe airspace disease with hypermetabolism since 09/08/2014. Most consistent with pneumonia.   Electronically Signed  By: Abigail Miyamoto M.D.  On: 09/25/2014 12:25  ASSESSMENT AND PLAN:  Stage IV Rectal Cancer with pulmonary metastases DVT/PE PET/CT equivocal mediastinal nodes and bilateral adrenal activity Excellent PS  We reviewed his pathology in detail. We reviewed his PET imaging. I devised the patient and his family that his mediastinal nodes and adrenal gland findings are not "conclusive." We discussed that if he has solitary metastases that surgical resection can be considered, we reviewed the NCCN guidelines in detail.  He will need a thoracic surgery opinion and I have referred them to [facility]. I advised the patient and his family once he is seen surgery we will regroup and make additional recommendations. They inquired about "cure" if we feel he has a solitary metastases and I advised them it is approximately 20%.  There were encouraged to write  down all of their questions and concerns prior to their next follow-up.   All questions were answered. The patient knows to call the clinic with any problems, questions or concerns. We can certainly see the patient much sooner if necessary.   This note is electronically signed.  This document serves as a record of services personally performed by Ancil Linsey, MD. It was created on her behalf by Janace Hoard, a trained medical scribe. The creation of this record is based on the scribe's personal observations and the provider's statements to them. This document has been checked and approved  by the attending provider.  I have reviewed the above documentation for accuracy and completeness, and I agree with the above.  Kelby Fam. Whitney Muse, MD

## 2014-11-03 ENCOUNTER — Institutional Professional Consult (permissible substitution) (INDEPENDENT_AMBULATORY_CARE_PROVIDER_SITE_OTHER): Payer: Medicare Other | Admitting: Cardiothoracic Surgery

## 2014-11-03 ENCOUNTER — Other Ambulatory Visit: Payer: Self-pay | Admitting: *Deleted

## 2014-11-03 ENCOUNTER — Encounter: Payer: Self-pay | Admitting: Cardiothoracic Surgery

## 2014-11-03 VITALS — BP 148/86 | HR 77 | Resp 20 | Ht 74.0 in | Wt 228.0 lb

## 2014-11-03 DIAGNOSIS — R911 Solitary pulmonary nodule: Secondary | ICD-10-CM

## 2014-11-03 NOTE — Progress Notes (Signed)
[address]       [place] [address]             [phone]                    [name] Medical Record #[id number] Date of Birth: [birth year]  Referring: [name], MD Primary Care: [name], MD  Chief Complaint:    Chief Complaint  Patient presents with  . Lung Lesion    Eval for pulmonary nodule, PET scan done 5/23    History of Present Illness:    Charles [name] 64 y.o. male is seen in the office  today for lung mass.  Patient was  never a smoker he has history  of rectal cancer s/p resection in 2012 in [facility] (completed chemo 05/2011) , prior DVT (off anticoagulation since 2013) admitted 09/08/14 for acute recurrent DVT/PE. He was seen in the   at ER by Dr Elsworth Soho  with acute dyspnea. CTA of the chest at [facility] was positive for an acute PE. He was  Transferred  to  [facility], he was evaluated by IR for bilateral DVT's, PE, and what appeared to be a clotted IVC filter.CT of the head was negative for acute intracranial findings. On 5/7 LE venous duplex was positive for DVT in bilateral common femoral, profunda, femoral veins, DVT in right popliteal vein. He was taken to the IR suite 5/9 and had BLE DVT thrombolysis performed. Procedure was successful and pt was then transferred to ICU for close observation. The  CT of the chest also revealed incidental finding of a RML 2cm lung nodule and lymphadenopathy, worrisome for bronchogenic metastasis.     Rectal cancer   Staging form: Colon and Rectum, AJCC 7th Edition     Clinical: Stage IIIB (T3, N1, M0) - Signed by [name], MD on 02/04/2011     Pathologic: No stage assigned - Unsigned    Current Activity/ Functional Status:  Patient is independent with mobility/ambulation, transfers, ADL's, IADL's.   Zubrod Score: At the time of surgery this patient's most appropriate activity status/level should be described as: '[x]'$     0    Normal  activity, no symptoms '[]'$     1    Restricted in physical strenuous activity but ambulatory, able to do out light work '[]'$     2    Ambulatory and capable of self care, unable to do work activities, up and about               >50 % of waking hours                              '[]'$     3    Only limited self care, in bed greater than 50% of waking hours '[]'$     4    Completely disabled, no self care, confined to bed or chair '[]'$     5    Moribund   Past Medical History  Diagnosis Date  . HTN (hypertension)   . Pulmonary embolism   . GERD (gastroesophageal reflux disease)   . Depression 04/01/2011  . DVT (deep venous thrombosis) 05/09/2011  . 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 Colon cancer 07/24/10    rectal ca, inv adenocarcinoma  . Hx of radiation therapy 09/02/10 to 10/14/10    pelvis  . Rectal cancer 01/15/2011  S/P radiation and concurrent 5-FU continuous infusion from 09/09/10- 10/10/10.  S/P proctectomy with colorectal anastomosis and diverting loop ileostomy on 11/14/10 at [facility] by Dr. Harlon Ditty. Pathology reveals a pT3b N1 with 3/20 lymph nodes.      Past Surgical History  Procedure Laterality Date  . Esophagogastroduodenoscopy  07/2010    RMR: schatki ring s/p dilation, small hh, SB bx benign  . Colonoscopy  07/2010    proximal rectal apple core mass 10-14cm from anal verge (adenocarcinoma), 2-3cm distal rectal carpet polyp s/p piecemeal snare polypectomy (adenoma)  . Eus  08/2010    Dr. Owens Loffler. uT3N0 circumferential, nearly obstruction rectosigmoid adenocarcinoma, distal edge 12cm from anal verge  . Ivc filter    . Colon surgery  11/14/2010    proctectomy with colorectal anastomosis and diverting loop ileostomy (temporary planned)  . Port a cath placement    . Colostomy reversal  april 2013  . Port-a-cath removal  09/24/2011    Procedure: REMOVAL PORT-A-CATH;  Surgeon: Donato Heinz, MD;  Location: [facility];  Service: General;  Laterality: N/A;  Minor Room  . Ivc filter    .  Colonoscopy  04/21/2012    RMR: Friable,fibrotic appearing colorectal anastomosis producing some luminal narrowing-not felt to be critical. path: focal erosion with slight inflammation and hyperemia. SURVEILLANCE DUE DEC 2015  . Colonoscopy N/A 11/24/2013    Dr. Rourk:somewhat fibrotic/friable anastomotic mucosal-status post biopsy (narrowing not felt to be clinically significant) Single colonic diverticulum. benign polypoid rectal mucosa    Family History  Problem Relation Age of Onset  . Colon cancer Neg Hx   . Liver disease Neg Hx   . Inflammatory bowel disease Neg Hx   . Cancer Brother     throat  . Cancer Brother     prostate    History   Social History  . Marital Status: Married    Spouse Name: N/A  . Number of Children: 2  . Years of Education: N/A   Occupational History  . self-employed Regulatory affairs officer, currently not working    Social History Main Topics  . Smoking status: Never Smoker   . Smokeless tobacco: Never Used  . Alcohol Use: No  . Drug Use: No  . Sexual Activity: Not on file   Other Topics Concern  . Not on file   Social History Narrative    History  Smoking status  . Never Smoker   Smokeless tobacco  . Never Used    History  Alcohol Use No     Allergies  Allergen Reactions  . Trazodone And Nefazodone Other (See Comments)    hallucinations    Current Outpatient Prescriptions  Medication Sig Dispense Refill  . clonazePAM (KLONOPIN) 0.5 MG tablet Take 0.5 mg by mouth 2 (two) times daily as needed for anxiety.     . diphenhydramine-acetaminophen (TYLENOL PM) 25-500 MG TABS Take 1 tablet by mouth at bedtime as needed (sleep).     . enoxaparin (LOVENOX) 100 MG/ML injection Inject 100 mg into the skin. Started Saturday for coumadin bridge    . finasteride (PROSCAR) 5 MG tablet Take 5 mg by mouth daily.    Marland Kitchen gabapentin (NEURONTIN) 300 MG capsule Take 3 capsules (900 mg total) by mouth 2 (two) times daily. 180 capsule 0  .  omeprazole (PRILOSEC OTC) 20 MG tablet Take 20 mg by mouth daily.    . psyllium (METAMUCIL) 58.6 % powder Take 1 packet by mouth 3 (three) times daily.    Marland Kitchen  tamsulosin (FLOMAX) 0.4 MG CAPS capsule Take 1 capsule (0.4 mg total) by mouth daily. 30 capsule 3  . vitamin B-12 (CYANOCOBALAMIN) 1000 MCG tablet Take 1,000 mcg by mouth daily.    . vitamin E 400 UNIT capsule Take 400 Units by mouth daily.    Marland Kitchen warfarin (COUMADIN) 5 MG tablet Take 7.5 mg at [place] on 5/13 & 5/14.  Take 5 mg at [facility] on 5/15 then follow up with your primary MD on 5/16 for INR and further adjustment. (Patient taking differently: Take 10 mg by mouth daily. 10 mg every Friday, Sat; 7.5 mg Sun-Thurs) 60 tablet 3   No current facility-administered medications for this visit.   Facility-Administered Medications Ordered in Other Visits  Medication Dose Route Frequency Provider Last Rate Last Dose  . sodium chloride 0.9 % injection 10 mL  10 mL Intravenous PRN Everardo All, MD   10 mL at 08/22/11 1114      Review of Systems:     Cardiac Review of Systems: Y or N  Chest Pain [ n   ]  Resting SOB [ n  ] Exertional SOB  [ n ]  Orthopnea [n  ]   Pedal Edema [n   ]    Palpitations [ n ] Syncope  [ n ]   Presyncope [ n  ]  General Review of Systems: [Y] = yes [  ]=no Constitional: recent weight change [n  ];  Wt loss over the last 3 months [   ] anorexia [  ]; fatigue [  ]; nausea [  ]; night sweats [  ]; fever [  ]; or chills [  ];          Dental: poor dentition[y  ]; Last Dentist visit:   Eye : blurred vision [  ]; diplopia [   ]; vision changes [  ];  Amaurosis fugax[  ]; Resp: cough [  ];  wheezing[  ];  hemoptysis[  ]; shortness of breath[  ]; paroxysmal nocturnal dyspnea[  ]; dyspnea on exertion[  ]; or orthopnea[  ];  GI:  gallstones[  ], vomiting[  ];  dysphagia[  ]; melena[  ];  hematochezia [  ]; heartburn[  ];   Hx of  Colonoscopy[  ]; GU: kidney stones [  ]; hematuria[  ];   dysuria [  ];  nocturia[  ];  history of      obstruction [  ]; urinary frequency [  ]             Skin: rash, swelling[  ];, hair loss[  ];  peripheral edema[  ];  or itching[  ]; Musculosketetal: myalgias[  ];  joint swelling[  ];  joint erythema[  ];  joint pain[  ];  back pain[  ];  Heme/Lymph: bruising[  ];  bleeding[  ];  anemia[  ];  Neuro: TIA[  ];  headaches[  ];  stroke[  ];  vertigo[  ];  seizures[  ];   paresthesias[ y ];  difficulty walking[n  ];  Psych:depression[  ]; anxiety[ y ];  Endocrine: diabetes[  ];  thyroid dysfunction[  ];  Immunizations: Flu up to date [ y ]; Pneumococcal up to date [ y ];  Other: Neuropathy from previous chemotherapy  Physical Exam: BP 148/86 mmHg  Pulse 77  Resp 20  Ht '6\' 2"'$  (1.88 m)  Wt 228 lb (103.42 kg)  BMI 29.26 kg/m2  SpO2 97%  PHYSICAL EXAMINATION: General  appearance: alert, cooperative, appears stated age and no distress Head: Normocephalic, without obvious abnormality, atraumatic Neck: no adenopathy, no carotid bruit, no JVD, supple, symmetrical, trachea midline and thyroid not enlarged, symmetric, no tenderness/mass/nodules Lymph nodes: Cervical, supraclavicular, and axillary nodes normal. Resp: clear to auscultation bilaterally Back: symmetric, no curvature. ROM normal. No CVA tenderness. Cardio: regular rate and rhythm, S1, S2 normal, no murmur, click, rub or gallop GI: soft, non-tender; bowel sounds normal; no masses,  no organomegaly Extremities: extremities normal, atraumatic, no cyanosis or edema and Homans sign is negative, no sign of DVT Patient has no carotid bruits Mild pedal edema palpable DP and PT pulses bilaterally.   Diagnostic Studies & Laboratory data:     Recent Radiology Findings:   Ct Biopsy  10/24/2014   CLINICAL DATA:  64 year old male with a history of right-sided lung nodule, referred for nodule biopsy.  Nodule was avid on prior PET-CT.  EXAM: CT-GUIDED BIOPSY RIGHT MIDDLE LOBE NODULE  MEDICATIONS AND MEDICAL HISTORY: Versed 5.0 mg, Fentanyl 100  mcg.  Additional Medications: None.  ANESTHESIA/SEDATION: Moderate sedation time: 13 minutes  PROCEDURE: The procedure, risks, benefits, and alternatives were explained to the patient and the patient's family. Specific risks discussed include bleeding, infection, pneumothorax, including asymptomatic pneumothorax which is expected, pulmonary hemorrhage, need for further procedure, such as a thoracostomy tube for treatment, cardiopulmonary collapse, death. Questions regarding the procedure were encouraged and answered. The patient understands and consents to the procedure.  The patient is position in the supine position on the CT gantry table. Scout image of the chest was performed.  The anterior right chest wall was prepped with Betadine in a sterile fashion, and a sterile drape was applied covering the operative field. A sterile gown and sterile gloves were used for the procedure.  Once the patient is prepped and draped in the usual sterile fashion, the skin and subcutaneous tissues were generously infiltrated with 1% lidocaine for local anesthesia. A small stab incision was made with 11 blade scalpel, and using CT guidance, a guide needle was advanced to the anterior chest wall into the center of the lung nodule. The stylet was removed and 3 separate 18 gauge core biopsy were achieved.  The needle was then removed and a final CT image was performed.  The patient tolerated the procedure well and remained hemodynamically stable throughout.  No complications were encountered and no significant blood loss was encountered.  FINDINGS: CT image of the chest demonstrates right middle lobe nodule, similar to prior PET-CT.  Images during the case demonstrate needle tip within the nodule for biopsy.  Status post biopsy, there is a small pneumothorax which did not require treatment.  COMPLICATIONS: Small pneumothorax after the procedure. Patient was asymptomatic. SIR level A: No therapy, no consequence.  IMPRESSION: Status  post biopsy of right-sided lung nodule, right middle lobe, with tissue specimen sent to pathology for complete histopathologic analysis.  Signed,  Dulcy Fanny. Earleen Newport, DO  [facility]  [facility]   Electronically Signed   By: Corrie Mckusick D.O.   On: 10/24/2014 15:05    CLINICAL DATA: Initial Treatment strategy for right middle lobe lung nodule. History of rectal cancer 07/24/2010. Radiation therapy in 2012. Recent pulmonary embolism.  EXAM: NUCLEAR MEDICINE PET SKULL BASE TO THIGH  TECHNIQUE: 11.4 mCi F-18 FDG was injected intravenously. Full-ring PET imaging was performed from the skull base to thigh after the radiotracer. CT data was obtained and used for attenuation correction and anatomic localization.  FASTING BLOOD GLUCOSE: Value: 99 mg/dl  COMPARISON: Chest abdomen and pelvic CTs of 09/08/2014. PET of 08/12/2010.  FINDINGS: NECK  No areas of abnormal hypermetabolism.  CHEST  Posterior right upper lobe patchy hypermetabolic airspace disease, including on image 31 of series 6. This is new since 09/08/2014, and measures a S.U.V. max of 6.0.  Hypermetabolic relatively well-circumscribed right middle lobe pulmonary nodule. 1.9 x 1.6 cm and a S.U.V. max of 3.3 on image 43.  High right paratracheal node is borderline to minimally enlarged at 8 mm. Measures a S.U.V. max of 2.6. This is similar to the mediastinal pool. Similarly, subcarinal node measures 9 mm and a S.U.V. max of 2.7 on image 81.  ABDOMEN/PELVIS  Right adrenal hypermetabolism is without CT correlate. Measures a S.U.V. max of 3.5.  Left adrenal hypermetabolism corresponds to minimal nodularity, on the order of 9 mm. This measures a S.U.V. max of 4.6, including on image 126. This left adrenal nodularity is felt to be similar to 08/12/2010. The hypermetabolism is new. medial limb left adrenal calcifications which are chronic.  SKELETON  No  abnormal marrow activity.  CT IMAGES PERFORMED FOR ATTENUATION CORRECTION  No cervical adenopathy.  Chest, abdomen, and pelvic findings otherwise deferred to recent diagnostic CT. 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 LAD coronary artery atherosclerosis. IVC filter.  IMPRESSION: 1. The right middle lobe pulmonary nodule is hypermetabolic, favored to represent a primary bronchogenic carcinoma. 2. Equivocal mediastinal nodes, similar to surrounding blood pool. 3. Bilateral adrenal hypermetabolism. Absence of CT abnormality on the right, and similarity of an area of mild left adrenal nodularity on the left argues for a physiologic explanation. Small volume metastatic disease, especially to the left adrenal gland, cannot be entirely excluded. 4. New right upper lobe airspace disease with hypermetabolism since 09/08/2014. Most consistent with pneumonia.   Electronically Signed  By: Abigail Miyamoto M.D.  On: 09/25/2014 12:25  I have independently reviewed the above radiologic studies.  Recent Lab Findings: Lab Results  Component Value Date   WBC 6.0 10/24/2014   HGB 14.6 10/24/2014   HCT 42.7 10/24/2014   PLT 235 10/24/2014   GLUCOSE 136* 09/15/2014   ALT 22 09/11/2014   AST 25 09/11/2014   NA 140 09/15/2014   K 3.8 09/15/2014   CL 107 09/15/2014   CREATININE 0.79 09/15/2014   BUN 9 09/15/2014   CO2 23 09/15/2014   TSH 1.219 01/15/2011   INR 1.06 10/24/2014   PFT's Pulmonary Function Diagnosis: Minimal Obstructive Airways Disease FEV1   3.4 85% DLCO 31.22 82%  Assessment / Plan:   1/ The right middle lobe pulmonary nodule is hypermetabolic,    ADENOCARCINOMA consistent with metastatic adenocarcinoma of a colorectal primary by biopsy 2/ New right upper lobe airspace disease with hypermetabolism since 09/08/2014. Most consistent with pneumonia 3/ Anticoagulation for history of DVT and caval filter placement     Risk and options of  surgical treatment of what appears to be a solitary pulmonary metastasis of adenocarcinoma of colorectal origin has been discussed with the patient in detail. Options including stereotactic radiotherapy were also discussed with the patient. The patient is willing to proceed with resection of isolated pulmonary metastasis. I've recommended that we proceed with a repeat CT scan of the entire chest on July 11. If the process in the right upper lobe is continuing to clear we will proceed with bronchoscopy right video-assisted thoracoscopy and resection of the pulmonary metastasis on July 13. The patient will stop  his Coumadin on July 9, have a pro time checked in Dr. Woody Seller office on Monday, July 11 and if appropriate start on Lovenox for the 11th and 12th, and none on July 13.    I  spent 40 minutes counseling the patient face to face and 50% or more the  time was spent in counseling and coordination of care. The total time spent in the appointment was 60 minutes.  [name] MD      [address] [place] [address] Office [phone]   Beeper [phone]  11/03/2014 1:11 PM

## 2014-11-07 ENCOUNTER — Other Ambulatory Visit: Payer: Self-pay | Admitting: *Deleted

## 2014-11-07 DIAGNOSIS — R911 Solitary pulmonary nodule: Secondary | ICD-10-CM

## 2014-11-13 ENCOUNTER — Ambulatory Visit (HOSPITAL_COMMUNITY)
Admission: RE | Admit: 2014-11-13 | Discharge: 2014-11-13 | Disposition: A | Discharge status: Home / Self Care | Payer: Medicare Other | Source: Ambulatory Visit | Attending: Cardiothoracic Surgery | Admitting: Cardiothoracic Surgery

## 2014-11-13 ENCOUNTER — Inpatient Hospital Stay (HOSPITAL_COMMUNITY)
Admission: RE | Admit: 2014-11-13 | Discharge: 2014-11-18 | DRG: 165 | Disposition: A | Discharge status: Home / Self Care | Payer: Medicare Other | Source: Ambulatory Visit | Attending: Cardiothoracic Surgery | Admitting: Cardiothoracic Surgery

## 2014-11-13 ENCOUNTER — Encounter (HOSPITAL_COMMUNITY): Payer: Self-pay | Admitting: Hematology & Oncology

## 2014-11-13 ENCOUNTER — Encounter (HOSPITAL_COMMUNITY): Payer: Medicare Other | Attending: Hematology & Oncology | Admitting: Hematology & Oncology

## 2014-11-13 VITALS — BP 150/88 | HR 66 | Temp 97.9°F | Resp 18 | Wt 224.2 lb

## 2014-11-13 DIAGNOSIS — R911 Solitary pulmonary nodule: Secondary | ICD-10-CM

## 2014-11-13 DIAGNOSIS — K760 Fatty (change of) liver, not elsewhere classified: Secondary | ICD-10-CM

## 2014-11-13 DIAGNOSIS — Z923 Personal history of irradiation: Secondary | ICD-10-CM

## 2014-11-13 DIAGNOSIS — C7801 Secondary malignant neoplasm of right lung: Principal | ICD-10-CM | POA: Diagnosis present

## 2014-11-13 DIAGNOSIS — G620 Drug-induced polyneuropathy: Secondary | ICD-10-CM | POA: Diagnosis not present

## 2014-11-13 DIAGNOSIS — Z85038 Personal history of other malignant neoplasm of large intestine: Secondary | ICD-10-CM | POA: Insufficient documentation

## 2014-11-13 DIAGNOSIS — I10 Essential (primary) hypertension: Secondary | ICD-10-CM | POA: Diagnosis present

## 2014-11-13 DIAGNOSIS — Z9889 Other specified postprocedural states: Secondary | ICD-10-CM

## 2014-11-13 DIAGNOSIS — Z4682 Encounter for fitting and adjustment of non-vascular catheter: Secondary | ICD-10-CM

## 2014-11-13 DIAGNOSIS — I2510 Atherosclerotic heart disease of native coronary artery without angina pectoris: Secondary | ICD-10-CM

## 2014-11-13 DIAGNOSIS — C20 Malignant neoplasm of rectum: Secondary | ICD-10-CM | POA: Diagnosis not present

## 2014-11-13 DIAGNOSIS — Z9221 Personal history of antineoplastic chemotherapy: Secondary | ICD-10-CM

## 2014-11-13 DIAGNOSIS — Z85048 Personal history of other malignant neoplasm of rectum, rectosigmoid junction, and anus: Secondary | ICD-10-CM | POA: Diagnosis not present

## 2014-11-13 DIAGNOSIS — D126 Benign neoplasm of colon, unspecified: Secondary | ICD-10-CM

## 2014-11-13 DIAGNOSIS — Z86711 Personal history of pulmonary embolism: Secondary | ICD-10-CM | POA: Diagnosis not present

## 2014-11-13 DIAGNOSIS — K219 Gastro-esophageal reflux disease without esophagitis: Secondary | ICD-10-CM | POA: Diagnosis present

## 2014-11-13 DIAGNOSIS — Z86718 Personal history of other venous thrombosis and embolism: Secondary | ICD-10-CM

## 2014-11-13 DIAGNOSIS — T451X5A Adverse effect of antineoplastic and immunosuppressive drugs, initial encounter: Secondary | ICD-10-CM

## 2014-11-13 DIAGNOSIS — C7800 Secondary malignant neoplasm of unspecified lung: Secondary | ICD-10-CM | POA: Diagnosis not present

## 2014-11-13 DIAGNOSIS — F419 Anxiety disorder, unspecified: Secondary | ICD-10-CM | POA: Diagnosis not present

## 2014-11-13 DIAGNOSIS — Z902 Acquired absence of lung [part of]: Secondary | ICD-10-CM

## 2014-11-13 DIAGNOSIS — C3490 Malignant neoplasm of unspecified part of unspecified bronchus or lung: Secondary | ICD-10-CM

## 2014-11-13 MED ORDER — ALPRAZOLAM 0.25 MG PO TABS: 0.2500 mg | ORAL_TABLET | Freq: Three times a day (TID) | ORAL | Status: DC | PRN

## 2014-11-13 MED ORDER — ESCITALOPRAM OXALATE 10 MG PO TABS: 10.0000 mg | ORAL_TABLET | Freq: Every day | ORAL | Status: DC

## 2014-11-13 NOTE — Patient Instructions (Signed)
[place] at [facility] Discharge Instructions  RECOMMENDATIONS MADE BY THE CONSULTANT AND ANY TEST RESULTS WILL BE SENT TO YOUR REFERRING PHYSICIAN.  Exam and discussion by Dr. Whitney Muse. Will give you xanax for temporary assistance with anxiety and will also put you on lexapro.  The lexapro will take longer to take effect and would like for you to stay on this medication.  Follow-up in 2 weeks.  Thank you for choosing [place] at [facility] to provide your oncology and hematology care.  To afford each patient quality time with our provider, please arrive at least 15 minutes before your scheduled appointment time.    You need to re-schedule your appointment should you arrive 10 or more minutes late.  We strive to give you quality time with our providers, and arriving late affects you and other patients whose appointments are after yours.  Also, if you no show three or more times for appointments you may be dismissed from the clinic at the providers discretion.     Again, thank you for choosing [facility].  Our hope is that these requests will decrease the amount of time that you wait before being seen by our physicians.       _____________________________________________________________  Should you have questions after your visit to [facility], please contact our office at [phone] between the hours of 8:30 a.m. and 4:30 p.m.  Voicemails left after 4:30 p.m. will not be returned until the following business day.  For prescription refill requests, have your pharmacy contact our office.

## 2014-11-13 NOTE — Progress Notes (Signed)
[name], MD [place] [address]  No diagnosis found.  CURRENT THERAPY: Surveillance per NCCN guidelines  INTERVAL HISTORY: Charles [name] 64 y.o. male returns for followup of stage III cancer of the rectum status post preoperative radiation and chemotherapy followed by definitive surgery. He then underwent adjuvant chemotherapy with FOLFOX. During the radiation therapy and continuous infusion 5-FU from 09/09/2010 through 10/10/2010. FOLFOX was administered from October 2012 through 07/14/2011. He is here for further discussion of a recently noted pulmonary nodule. Biopsy on 6/21 was positive for recurrent CRC.  He is here today with his wife and daughter.  He has a CT of his chest scheduled for today and surgery this coming Wednesday, 7/13. The surgery will be with Dr. Servando Snare at [facility], he plans to be hospitalized about 6 days.  His daughter mentions that her father has been very anxious during appointments with Dr. Servando Snare. This concerns them. They mention that his doctors are looking into switching him to Xarelto. They have questions about XARELTO.  He is still active. Denies cough, fever or change in appetite.    Rectal cancer   07/24/2010 Initial Diagnosis Invasive adenocarcinoma of rectum   09/09/2010 Concurrent Chemotherapy S/P radiation and concurrent 5-FU continuous infusion from 09/09/10- 10/10/10.   11/14/2010 Surgery S/P proctectomy with colorectal anastomosis and diverting loop ileostomy on 11/14/10 at [facility] by Dr. Harlon Ditty. Pathology reveals a pT3b N1 with 3/20 lymph nodes.   02/05/2011 - 07/14/2011 Chemotherapy FOLFOX   08/18/2011 Surgery Approximate date of surgery- [place] by Dr. Harlon Ditty    Remission    11/24/2013 Survivorship Colonoscopy- somewhat fibrotic/friable anastomotic mucosal-status post biopsy (narrowing not felt to be clinically significant).  Negative pathology for malignancy   09/12/2014 Imaging DVT in the left femoral  venous system, left common iliac vein, IVC, and within the IVC filter Right lower extremity venography confirms chronic occlusion of the femoral venous system with collateralization. The right iliac venous system is patent and do   09/25/2014 PET scan The right middle lobe pulmonary nodule is hypermetabolic, favored to represent a primary bronchogenic carcinoma.Equivocal mediastinal nodes, similar to surrounding blood pool. Bilateral adrenal hypermetabolism, felt to be physiologic   10/24/2014 Pathology Results Lung, needle/core biopsy(ies), RML - ADENOCARCINOMA, SEE COMMENT metastatic adenocarcinoma of a colorectal primary     Past Medical History  Diagnosis Date  . HTN (hypertension)   . Pulmonary embolism   . GERD (gastroesophageal reflux disease)   . Depression 04/01/2011  . DVT (deep venous thrombosis) 05/09/2011  . 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 Colon cancer 07/24/10    rectal ca, inv adenocarcinoma  . Hx of radiation therapy 09/02/10 to 10/14/10    pelvis  . Rectal cancer 01/15/2011    S/P radiation and concurrent 5-FU continuous infusion from 09/09/10- 10/10/10.  S/P proctectomy with colorectal anastomosis and diverting loop ileostomy on 11/14/10 at [facility] by Dr. Harlon Ditty. Pathology reveals a pT3b N1 with 3/20 lymph nodes.    . Lung metastasis   . Peripheral vascular disease     dvt's,pe  . Pneumonia     hx x3  . Chronic anticoagulation     has GERD; Rectal cancer; Hx of radiation therapy; Stiffness of joint, not elsewhere classified, ankle and foot; Weakness of both legs; Peripheral neuropathy due to oxaliplatin-chemotherapy; Diarrhea; Acute pulmonary embolism; Lactic acidosis; Lower abdominal pain; Urinary retention; Lung nodule, solitary; DVT (deep venous thrombosis); Lung nodule; S/P partial lobectomy of  lung; GI bleed; Chronic anticoagulation; Constipation; Heme positive stool; Gastritis; S/P thoracotomy; and History of rectal cancer on his problem list.     is allergic to  oxycodone; tramadol; and trazodone and nefazodone.  Mr. Loh does not currently have medications on file.  Past Surgical History  Procedure Laterality Date  . Esophagogastroduodenoscopy  07/2010    RMR: schatki ring s/p dilation, small hh, SB bx benign  . Colonoscopy  07/2010    proximal rectal apple core mass 10-14cm from anal verge (adenocarcinoma), 2-3cm distal rectal carpet polyp s/p piecemeal snare polypectomy (adenoma)  . Eus  08/2010    Dr. Owens Loffler. uT3N0 circumferential, nearly obstruction rectosigmoid adenocarcinoma, distal edge 12cm from anal verge  . Ivc filter    . Colon surgery  11/14/2010    proctectomy with colorectal anastomosis and diverting loop ileostomy (temporary planned)  . Port a cath placement    . Colostomy reversal  april 2013  . Port-a-cath removal  09/24/2011    Procedure: REMOVAL PORT-A-CATH;  Surgeon: Donato Heinz, MD;  Location: [facility];  Service: General;  Laterality: N/A;  Minor Room  . Ivc filter    . Colonoscopy  04/21/2012    RMR: Friable,fibrotic appearing colorectal anastomosis producing some luminal narrowing-not felt to be critical. path: focal erosion with slight inflammation and hyperemia. SURVEILLANCE DUE DEC 2015  . Colonoscopy N/A 11/24/2013    Dr. Rourk:somewhat fibrotic/friable anastomotic mucosal-status post biopsy (narrowing not felt to be clinically significant) Single colonic diverticulum. benign polypoid rectal mucosa  . Hernia repair      abd hernia repair  . Video bronchoscopy N/A 11/15/2014    Procedure: VIDEO BRONCHOSCOPY;  Surgeon: [name], MD;  Location: [facility] OR;  Service: Thoracic;  Laterality: N/A;  . Video assisted thoracoscopy (vats)/wedge resection Right 11/15/2014    Procedure: VIDEO ASSISTED THORACOSCOPY (VATS)/LUNG RESECTION WITH RIGHT LINGULECTOMY;  Surgeon: [name], MD;  Location: [place];  Service: Thoracic;  Laterality: Right;    Denies any headaches, dizziness, double vision, fevers, chills,  night sweats, nausea, vomiting, diarrhea, constipation, chest pain, heart palpitations, shortness of breath, blood in stool, black tarry stool, urinary pain, urinary burning, urinary frequency, hematuria. 14 point review of systems was performed and is negative except as detailed under history of present illness and above    PHYSICAL EXAMINATION  ECOG PERFORMANCE STATUS: 0 - Asymptomatic  Filed Vitals:   11/13/14 0811  BP: 150/88  Pulse: 66  Temp: 97.9 F (36.6 C)  Resp: 18    GENERAL:alert, no distress, well nourished, well developed, comfortable, cooperative, obese and smiling, unaccompanied  SKIN: skin color, texture, turgor are normal, no rashes or significant lesions HEAD: Normocephalic, No masses, lesions, tenderness or abnormalities EYES: normal, PERRLA, EOMI, Conjunctiva are pink and non-injected EARS: External ears normal OROPHARYNX:lips, buccal mucosa, and tongue normal and mucous membranes are moist  NECK: supple, no adenopathy, thyroid normal size, non-tender, without nodularity, no stridor, non-tender, trachea midline LYMPH:  no palpable lymphadenopathy, no hepatosplenomegaly BREAST:not examined LUNGS: clear to auscultation and percussion HEART: regular rate & rhythm, no murmurs, no gallops, S1 normal and S2 normal ABDOMEN:abdomen soft, non-tender, obese, normal bowel sounds, no masses or organomegaly and no hepatosplenomegaly BACK: Back symmetric, no curvature., No CVA tenderness EXTREMITIES:less then 2 second capillary refill, no joint deformities, effusion, or inflammation, no edema, no skin discoloration, no clubbing, no cyanosis  NEURO: alert & oriented x 3 with fluent speech, no focal motor/sensory deficits, gait normal    LABORATORY DATA:  Results for ZAYLIN, RUNCO (MRN [id number])   Ref. Range 10/24/2014 10:56  WBC Latest Ref Range: 4.0-10.5 K/uL 6.0  RBC Latest Ref Range: 4.22-5.81 MIL/uL 4.97  Hemoglobin Latest Ref Range: 13.0-17.0 g/dL 14.6  HCT  Latest Ref Range: 39.0-52.0 % 42.7  MCV Latest Ref Range: 78.0-100.0 fL 85.9  MCH Latest Ref Range: 26.0-34.0 pg 29.4  MCHC Latest Ref Range: 30.0-36.0 g/dL 34.2  RDW Latest Ref Range: 11.5-15.5 % 13.0  Platelets Latest Ref Range: 150-400 K/uL 235  Prothrombin Time Latest Ref Range: 11.6-15.2 seconds 14.0  INR Latest Ref Range: 0.00-1.49  1.06  APTT Latest Ref Range: 24-37 seconds 32     ASSESSMENT AND PLAN:  History of Stage III CRC Treatment related neuropathy Solitary pulmonary nodule, positive for recurrent CRC  We will plan on seeing him back after his surgical resection. In regards to additional chemotherapy, we reviewed the NCCN  guidelines today again. I advised them that in patients treated with previous oxaliplatin based therapy, observation is not unreasonable but we can revisit this again at follow-up.  He has significant anxiety.  I advised the patient and family that xanax is not a good long term solution and I have recommended starting lexapro. They are agreeable.  A prescription was faxed in to their pharmacy.  I answered their questions about XARELTO.   All questions were answered. The patient knows to call the clinic with any problems, questions or concerns. We can certainly see the patient much sooner if necessary.  This document serves as a record of services personally performed by Ancil Linsey, MD. It was created on her behalf by Arlyce Harman, a trained medical scribe. The creation of this record is based on the scribe's personal observations and the provider's statements to them. This document has been checked and approved by the attending provider.  I have reviewed the above documentation for accuracy and completeness, and I agree with the above.   Penland,Shannon Kristen 11/26/2014 6:18 PM

## 2014-11-14 ENCOUNTER — Encounter (HOSPITAL_COMMUNITY): Payer: Self-pay

## 2014-11-14 ENCOUNTER — Ambulatory Visit (HOSPITAL_COMMUNITY)
Admission: RE | Admit: 2014-11-14 | Discharge: 2014-11-14 | Disposition: A | Discharge status: Home / Self Care | Payer: Medicare Other | Source: Ambulatory Visit | Attending: Cardiothoracic Surgery | Admitting: Cardiothoracic Surgery

## 2014-11-14 ENCOUNTER — Encounter (HOSPITAL_COMMUNITY)
Admission: RE | Admit: 2014-11-14 | Discharge: 2014-11-14 | Disposition: A | Discharge status: Home / Self Care | Payer: Medicare Other | Source: Ambulatory Visit | Attending: Cardiothoracic Surgery | Admitting: Cardiothoracic Surgery

## 2014-11-14 ENCOUNTER — Other Ambulatory Visit: Payer: Self-pay

## 2014-11-14 VITALS — BP 150/71 | HR 71 | Temp 98.2°F | Resp 18 | Ht 74.0 in | Wt 225.1 lb

## 2014-11-14 DIAGNOSIS — Z86711 Personal history of pulmonary embolism: Secondary | ICD-10-CM

## 2014-11-14 DIAGNOSIS — Z01818 Encounter for other preprocedural examination: Secondary | ICD-10-CM

## 2014-11-14 DIAGNOSIS — Z85048 Personal history of other malignant neoplasm of rectum, rectosigmoid junction, and anus: Secondary | ICD-10-CM | POA: Insufficient documentation

## 2014-11-14 DIAGNOSIS — Z01812 Encounter for preprocedural laboratory examination: Secondary | ICD-10-CM

## 2014-11-14 DIAGNOSIS — R911 Solitary pulmonary nodule: Secondary | ICD-10-CM

## 2014-11-14 DIAGNOSIS — I10 Essential (primary) hypertension: Secondary | ICD-10-CM

## 2014-11-14 HISTORY — DX: Pneumonia, unspecified organism: J18.9

## 2014-11-14 HISTORY — DX: Peripheral vascular disease, unspecified: I73.9

## 2014-11-14 LAB — URINALYSIS, ROUTINE W REFLEX MICROSCOPIC
Bilirubin Urine: NEGATIVE
Glucose, UA: NEGATIVE mg/dL
Hgb urine dipstick: NEGATIVE
Ketones, ur: NEGATIVE mg/dL
Leukocytes, UA: NEGATIVE
Nitrite: NEGATIVE
Protein, ur: NEGATIVE mg/dL
Specific Gravity, Urine: 1.024 (ref 1.005–1.030)
Urobilinogen, UA: 1 mg/dL (ref 0.0–1.0)
pH: 5.5 (ref 5.0–8.0)

## 2014-11-14 LAB — COMPREHENSIVE METABOLIC PANEL
ALT: 18 U/L (ref 17–63)
AST: 17 U/L (ref 15–41)
Albumin: 3.6 g/dL (ref 3.5–5.0)
Alkaline Phosphatase: 68 U/L (ref 38–126)
Anion gap: 10 (ref 5–15)
BUN: 10 mg/dL (ref 6–20)
CO2: 21 mmol/L — ABNORMAL LOW (ref 22–32)
Calcium: 8.5 mg/dL — ABNORMAL LOW (ref 8.9–10.3)
Chloride: 106 mmol/L (ref 101–111)
Creatinine, Ser: 0.73 mg/dL (ref 0.61–1.24)
GFR calc Af Amer: >60 mL/min (ref >60)
GFR calc non Af Amer: >60 mL/min (ref >60)
Glucose, Bld: 95 mg/dL (ref 65–99)
Potassium: 3.8 mmol/L (ref 3.5–5.1)
Sodium: 137 mmol/L (ref 135–145)
Total Bilirubin: 1 mg/dL (ref 0.3–1.2)
Total Protein: 6.2 g/dL — ABNORMAL LOW (ref 6.5–8.1)

## 2014-11-14 LAB — CBC
HCT: 39.9 % (ref 39.0–52.0)
Hemoglobin: 14.1 g/dL (ref 13.0–17.0)
MCH: 29.9 pg (ref 26.0–34.0)
MCHC: 35.3 g/dL (ref 30.0–36.0)
MCV: 84.5 fL (ref 78.0–100.0)
Platelets: 224 10*3/uL (ref 150–400)
RBC: 4.72 MIL/uL (ref 4.22–5.81)
RDW: 12.8 % (ref 11.5–15.5)
WBC: 5.6 10*3/uL (ref 4.0–10.5)

## 2014-11-14 LAB — PROTIME-INR
INR: 1.12 (ref 0.00–1.49)
Prothrombin Time: 14.6 seconds (ref 11.6–15.2)

## 2014-11-14 LAB — TYPE AND SCREEN
ABO/RH(D): O POS
Antibody Screen: NEGATIVE

## 2014-11-14 LAB — BLOOD GAS, ARTERIAL
Acid-base deficit: 1 mmol/L (ref 0.0–2.0)
Bicarbonate: 23 mEq/L (ref 20.0–24.0)
Drawn by: 42180
FIO2: 0.21 %
O2 Saturation: 95.7 %
Patient temperature: 98.6
TCO2: 24.1 mmol/L (ref 0–100)
pCO2 arterial: 36.6 mmHg (ref 35.0–45.0)
pH, Arterial: 7.414 (ref 7.350–7.450)
pO2, Arterial: 85.7 mmHg (ref 80.0–100.0)

## 2014-11-14 LAB — SURGICAL PCR SCREEN
MRSA, PCR: NEGATIVE
Staphylococcus aureus: POSITIVE — AB

## 2014-11-14 LAB — APTT: aPTT: 39 seconds — ABNORMAL HIGH (ref 24–37)

## 2014-11-14 LAB — ABO/RH: ABO/RH(D): O POS

## 2014-11-14 MED ORDER — DEXTROSE 5 % IV SOLN
1.5000 g | INTRAVENOUS | Status: AC
  Administered 2014-11-15: 1.5 g via INTRAVENOUS
  Filled 2014-11-14: qty 1.5

## 2014-11-14 NOTE — Pre-Procedure Instructions (Addendum)
Charles [name]  11/14/2014      [facility] [address] [place] - [address] [place] [address] Phone: [phone] Fax: [place], [place] [address] Phone: [phone] Fax: [phone]    Your procedure is scheduled on 11/15/14  Report to [facility] short stay admitting at 630 A.M.  Call this number if you have prblems the morning of surgery:  [phone]   Remember:  Do not eat food or drink liquids after midnight.  Take these medicines the morning of surgery with A SIP OF WATER xanax if needed,gabapentin, clonazepam,lexapro,proscar, prilosec, flomax   STOP all herbel meds, nsaids (aleve,naproxen,advil,ibuprofen) starting now.   Stop  Warfarin d/c,11/09/14 lovenox per dr take 7/11,12 per dr   Lazaro Arms not wear jewelry, make-up or nail polish.  Do not wear lotions, powders, or perfumes.  You may wear deodorant.  Do not shave 48 hours prior to surgery.  Men may shave face and neck.  Do not bring valuables to the hospital.  [facility] is not responsible for any belongings or valuables.  Contacts, dentures or bridgework may not be worn into surgery.  Leave your suitcase in the car.  After surgery it may be brought to your room.  For patients admitted to the hospital, discharge time will be determined by your treatment team.  Patients discharged the day of surgery will not be allowed to drive home.   Name and phone number of your driver:    Special instructions:  Special Instructions: [place] - Preparing for Surgery  Before surgery, you can play an important role.  Because skin is not sterile, your skin needs to be as free of germs as possible.  You can reduce the number of germs on you skin by washing with CHG (chlorahexidine gluconate) soap before surgery.  CHG is an antiseptic cleaner which kills germs and bonds with the skin to continue killing germs even after  washing.  Please DO NOT use if you have an allergy to CHG or antibacterial soaps.  If your skin becomes reddened/irritated stop using the CHG and inform your nurse when you arrive at Short Stay.  Do not shave (including legs and underarms) for at least 48 hours prior to the first CHG shower.  You may shave your face.  Please follow these instructions carefully:   1.  Shower with CHG Soap the night before surgery and the morning of Surgery.  2.  If you choose to wash your hair, wash your hair first as usual with your normal shampoo.  3.  After you shampoo, rinse your hair and body thoroughly to remove the Shampoo.  4.  Use CHG as you would any other liquid soap.  You can apply chg directly  to the skin and wash gently with scrungie or a clean washcloth.  5.  Apply the CHG Soap to your body ONLY FROM THE NECK DOWN.  Do not use on open wounds or open sores.  Avoid contact with your eyes ears, mouth and genitals (private parts).  Wash genitals (private parts)       with your normal soap.  6.  Wash thoroughly, paying special attention to the area where your surgery will be performed.  7.  Thoroughly rinse your body with warm water from the neck down.  8.  DO NOT shower/wash with your normal soap after using and rinsing off  the CHG Soap.  9.  Pat yourself dry with a clean towel.            10.  Wear clean pajamas.            11.  Place clean sheets on your bed the night of your first shower and do not sleep with pets.  Day of Surgery  Do not apply any lotions/deodorants the morning of surgery.  Please wear clean clothes to the hospital/surgery center.  Please read over the following fact sheets that you were given. Pain Booklet, Coughing and Deep Breathing, Blood Transfusion Information, MRSA Information and Surgical Site Infection Prevention

## 2014-11-14 NOTE — Progress Notes (Signed)
   11/14/14 1422  OBSTRUCTIVE SLEEP APNEA  Have you ever been diagnosed with sleep apnea through a sleep study? No  Do you snore loudly (loud enough to be heard through closed doors)?  1  Do you often feel tired, fatigued, or sleepy during the daytime? 0  Has anyone observed you stop breathing during your sleep? 1  Do you have, or are you being treated for high blood pressure? 0  BMI more than 35 kg/m2? 0  Age over 64 years old? 1  Neck circumference greater than 40 cm/16 inches? 1 (18.5)  Gender: 1  Obstructive Sleep Apnea Score 5

## 2014-11-15 ENCOUNTER — Inpatient Hospital Stay (HOSPITAL_COMMUNITY): Payer: Medicare Other | Admitting: Emergency Medicine

## 2014-11-15 ENCOUNTER — Inpatient Hospital Stay (HOSPITAL_COMMUNITY): Payer: Medicare Other

## 2014-11-15 ENCOUNTER — Encounter (HOSPITAL_COMMUNITY)
Admission: RE | Disposition: A | Discharge status: Home / Self Care | Payer: Self-pay | Source: Ambulatory Visit | Attending: Cardiothoracic Surgery

## 2014-11-15 ENCOUNTER — Encounter (HOSPITAL_COMMUNITY): Payer: Self-pay | Admitting: *Deleted

## 2014-11-15 ENCOUNTER — Inpatient Hospital Stay (HOSPITAL_COMMUNITY): Payer: Medicare Other | Admitting: Anesthesiology

## 2014-11-15 DIAGNOSIS — Z902 Acquired absence of lung [part of]: Secondary | ICD-10-CM

## 2014-11-15 DIAGNOSIS — R911 Solitary pulmonary nodule: Secondary | ICD-10-CM

## 2014-11-15 HISTORY — PX: VIDEO ASSISTED THORACOSCOPY (VATS)/WEDGE RESECTION: SHX6174

## 2014-11-15 HISTORY — PX: VIDEO BRONCHOSCOPY: SHX5072

## 2014-11-15 SURGERY — BRONCHOSCOPY, VIDEO-ASSISTED
Anesthesia: General | Site: Chest | Laterality: Right

## 2014-11-15 MED ORDER — 0.9 % SODIUM CHLORIDE (POUR BTL) OPTIME
TOPICAL | Status: DC | PRN
  Administered 2014-11-15: 2000 mL

## 2014-11-15 MED ORDER — HYDROMORPHONE HCL 1 MG/ML IJ SOLN
0.2500 mg | INTRAMUSCULAR | Status: DC | PRN
  Administered 2014-11-15 (×2): 0.5 mg via INTRAVENOUS

## 2014-11-15 MED ORDER — LACTATED RINGERS IV SOLN
INTRAVENOUS | Status: DC | PRN
  Administered 2014-11-15: 08:00:00 via INTRAVENOUS

## 2014-11-15 MED ORDER — ARTIFICIAL TEARS OP OINT
TOPICAL_OINTMENT | OPHTHALMIC | Status: DC | PRN
Start: 2014-11-15 — End: 2014-11-15
  Administered 2014-11-15: 1 via OPHTHALMIC

## 2014-11-15 MED ORDER — ONDANSETRON HCL 4 MG/2ML IJ SOLN: 4.0000 mg | Freq: Four times a day (QID) | INTRAMUSCULAR | Status: DC | PRN

## 2014-11-15 MED ORDER — ONDANSETRON HCL 4 MG/2ML IJ SOLN
INTRAMUSCULAR | Status: DC | PRN
  Administered 2014-11-15: 4 mg via INTRAVENOUS

## 2014-11-15 MED ORDER — ONDANSETRON HCL 4 MG/2ML IJ SOLN: 4.0000 mg | Freq: Once | INTRAMUSCULAR | Status: DC | PRN

## 2014-11-15 MED ORDER — MIDAZOLAM HCL 2 MG/2ML IJ SOLN
INTRAMUSCULAR | Status: AC
Start: 2014-11-15 — End: 2014-11-15
  Filled 2014-11-15: qty 2

## 2014-11-15 MED ORDER — ONDANSETRON HCL 4 MG/2ML IJ SOLN
4.0000 mg | Freq: Four times a day (QID) | INTRAMUSCULAR | Status: DC | PRN
  Administered 2014-11-15 – 2014-11-16 (×2): 4 mg via INTRAVENOUS
  Filled 2014-11-15 (×2): qty 2

## 2014-11-15 MED ORDER — CLONAZEPAM 0.5 MG PO TABS: 0.5000 mg | ORAL_TABLET | Freq: Two times a day (BID) | ORAL | Status: DC | PRN

## 2014-11-15 MED ORDER — PHENYLEPHRINE 40 MCG/ML (10ML) SYRINGE FOR IV PUSH (FOR BLOOD PRESSURE SUPPORT)
PREFILLED_SYRINGE | INTRAVENOUS | Status: AC
  Filled 2014-11-15: qty 10

## 2014-11-15 MED ORDER — DEXTROSE 5 % IV SOLN
1.5000 g | Freq: Two times a day (BID) | INTRAVENOUS | Status: AC
  Administered 2014-11-15 – 2014-11-16 (×2): 1.5 g via INTRAVENOUS
  Filled 2014-11-15 (×2): qty 1.5

## 2014-11-15 MED ORDER — DIPHENHYDRAMINE HCL 50 MG/ML IJ SOLN: 12.5000 mg | Freq: Four times a day (QID) | INTRAMUSCULAR | Status: DC | PRN

## 2014-11-15 MED ORDER — OXYCODONE HCL 5 MG PO TABS
5.0000 mg | ORAL_TABLET | ORAL | Status: DC | PRN
  Administered 2014-11-15: 10 mg via ORAL
  Administered 2014-11-15: 5 mg via ORAL
  Administered 2014-11-16 – 2014-11-17 (×4): 10 mg via ORAL
  Administered 2014-11-18: 5 mg via ORAL
  Filled 2014-11-15: qty 1
  Filled 2014-11-15 (×2): qty 2
  Filled 2014-11-15: qty 1
  Filled 2014-11-15 (×3): qty 2

## 2014-11-15 MED ORDER — DEXTROSE-NACL 5-0.45 % IV SOLN
INTRAVENOUS | Status: DC
  Administered 2014-11-15: 14:00:00 via INTRAVENOUS
  Administered 2014-11-15: 100 mL/h via INTRAVENOUS
  Administered 2014-11-16 – 2014-11-17 (×2): via INTRAVENOUS

## 2014-11-15 MED ORDER — SUCCINYLCHOLINE CHLORIDE 20 MG/ML IJ SOLN
INTRAMUSCULAR | Status: AC
  Filled 2014-11-15: qty 1

## 2014-11-15 MED ORDER — ESCITALOPRAM OXALATE 10 MG PO TABS
10.0000 mg | ORAL_TABLET | Freq: Every day | ORAL | Status: DC
  Administered 2014-11-16 – 2014-11-18 (×3): 10 mg via ORAL
  Filled 2014-11-15 (×4): qty 1

## 2014-11-15 MED ORDER — ALPRAZOLAM 0.25 MG PO TABS
0.2500 mg | ORAL_TABLET | Freq: Three times a day (TID) | ORAL | Status: DC | PRN
  Administered 2014-11-16: 0.25 mg via ORAL
  Filled 2014-11-15: qty 1

## 2014-11-15 MED ORDER — HYDROMORPHONE HCL 1 MG/ML IJ SOLN
INTRAMUSCULAR | Status: AC
  Filled 2014-11-15: qty 1

## 2014-11-15 MED ORDER — NEOSTIGMINE METHYLSULFATE 10 MG/10ML IV SOLN
INTRAVENOUS | Status: AC
  Filled 2014-11-15: qty 1

## 2014-11-15 MED ORDER — ACETAMINOPHEN 500 MG PO TABS
1000.0000 mg | ORAL_TABLET | Freq: Four times a day (QID) | ORAL | Status: DC
  Administered 2014-11-15 – 2014-11-18 (×10): 1000 mg via ORAL
  Filled 2014-11-15 (×15): qty 2

## 2014-11-15 MED ORDER — TAMSULOSIN HCL 0.4 MG PO CAPS
0.4000 mg | ORAL_CAPSULE | Freq: Every day | ORAL | Status: DC
  Administered 2014-11-15 – 2014-11-18 (×4): 0.4 mg via ORAL
  Filled 2014-11-15 (×5): qty 1

## 2014-11-15 MED ORDER — PROPOFOL 10 MG/ML IV BOLUS
INTRAVENOUS | Status: AC
Start: 2014-11-15 — End: 2014-11-15
  Filled 2014-11-15: qty 20

## 2014-11-15 MED ORDER — EPHEDRINE SULFATE 50 MG/ML IJ SOLN
INTRAMUSCULAR | Status: AC
  Filled 2014-11-15: qty 2

## 2014-11-15 MED ORDER — GLYCOPYRROLATE 0.2 MG/ML IJ SOLN
INTRAMUSCULAR | Status: AC
  Filled 2014-11-15: qty 2

## 2014-11-15 MED ORDER — METOCLOPRAMIDE HCL 5 MG/ML IJ SOLN
10.0000 mg | Freq: Four times a day (QID) | INTRAMUSCULAR | Status: AC
  Administered 2014-11-15 – 2014-11-16 (×4): 10 mg via INTRAVENOUS
  Filled 2014-11-15 (×4): qty 2

## 2014-11-15 MED ORDER — FENTANYL CITRATE (PF) 250 MCG/5ML IJ SOLN
INTRAMUSCULAR | Status: AC
  Filled 2014-11-15: qty 5

## 2014-11-15 MED ORDER — SODIUM CHLORIDE 0.9 % IJ SOLN: 9.0000 mL | INTRAMUSCULAR | Status: DC | PRN

## 2014-11-15 MED ORDER — OXYCODONE HCL 5 MG PO TABS: 5.0000 mg | ORAL_TABLET | Freq: Once | ORAL | Status: DC | PRN

## 2014-11-15 MED ORDER — MIDAZOLAM HCL 5 MG/5ML IJ SOLN
INTRAMUSCULAR | Status: DC | PRN
  Administered 2014-11-15: 2 mg via INTRAVENOUS

## 2014-11-15 MED ORDER — FENTANYL 10 MCG/ML IV SOLN
INTRAVENOUS | Status: DC
  Administered 2014-11-15: 195 ug via INTRAVENOUS
  Administered 2014-11-15: 90 ug via INTRAVENOUS
  Administered 2014-11-15: 22:00:00 via INTRAVENOUS
  Administered 2014-11-15: 15 ug via INTRAVENOUS
  Administered 2014-11-16: 150 ug via INTRAVENOUS
  Administered 2014-11-16: 165 ug via INTRAVENOUS
  Filled 2014-11-15 (×2): qty 50

## 2014-11-15 MED ORDER — POTASSIUM CHLORIDE 10 MEQ/50ML IV SOLN: 10.0000 meq | Freq: Every day | INTRAVENOUS | Status: DC | PRN

## 2014-11-15 MED ORDER — SUCCINYLCHOLINE CHLORIDE 20 MG/ML IJ SOLN
INTRAMUSCULAR | Status: AC
Start: 2014-11-15 — End: 2014-11-15
  Filled 2014-11-15: qty 1

## 2014-11-15 MED ORDER — BISACODYL 5 MG PO TBEC
10.0000 mg | DELAYED_RELEASE_TABLET | Freq: Every day | ORAL | Status: DC
  Administered 2014-11-15 – 2014-11-18 (×2): 10 mg via ORAL
  Filled 2014-11-15 (×3): qty 2

## 2014-11-15 MED ORDER — ARTIFICIAL TEARS OP OINT
TOPICAL_OINTMENT | OPHTHALMIC | Status: AC
Start: 2014-11-15 — End: 2014-11-15
  Filled 2014-11-15: qty 3.5

## 2014-11-15 MED ORDER — NEOSTIGMINE METHYLSULFATE 10 MG/10ML IV SOLN
INTRAVENOUS | Status: DC | PRN
  Administered 2014-11-15: 5 mg via INTRAVENOUS

## 2014-11-15 MED ORDER — LIDOCAINE HCL (CARDIAC) 20 MG/ML IV SOLN
INTRAVENOUS | Status: AC
  Filled 2014-11-15: qty 5

## 2014-11-15 MED ORDER — NALOXONE HCL 0.4 MG/ML IJ SOLN: 0.4000 mg | INTRAMUSCULAR | Status: DC | PRN

## 2014-11-15 MED ORDER — ONDANSETRON HCL 4 MG/2ML IJ SOLN
INTRAMUSCULAR | Status: AC
  Filled 2014-11-15: qty 2

## 2014-11-15 MED ORDER — ROCURONIUM BROMIDE 50 MG/5ML IV SOLN
INTRAVENOUS | Status: AC
Start: 2014-11-15 — End: 2014-11-15
  Filled 2014-11-15: qty 2

## 2014-11-15 MED ORDER — PROPOFOL 10 MG/ML IV BOLUS
INTRAVENOUS | Status: AC
  Filled 2014-11-15: qty 20

## 2014-11-15 MED ORDER — LEVALBUTEROL HCL 0.63 MG/3ML IN NEBU
0.6300 mg | INHALATION_SOLUTION | Freq: Four times a day (QID) | RESPIRATORY_TRACT | Status: DC
  Administered 2014-11-15 – 2014-11-17 (×5): 0.63 mg via RESPIRATORY_TRACT
  Filled 2014-11-15 (×16): qty 3

## 2014-11-15 MED ORDER — OXYCODONE HCL 5 MG/5ML PO SOLN: 5.0000 mg | Freq: Once | ORAL | Status: DC | PRN

## 2014-11-15 MED ORDER — MUPIROCIN 2 % EX OINT
1.0000 "application " | TOPICAL_OINTMENT | Freq: Once | CUTANEOUS | Status: AC
  Administered 2014-11-15: 1 via TOPICAL

## 2014-11-15 MED ORDER — EPHEDRINE SULFATE 50 MG/ML IJ SOLN
INTRAMUSCULAR | Status: AC
  Filled 2014-11-15: qty 1

## 2014-11-15 MED ORDER — FINASTERIDE 5 MG PO TABS
5.0000 mg | ORAL_TABLET | Freq: Every day | ORAL | Status: DC
  Administered 2014-11-15 – 2014-11-18 (×4): 5 mg via ORAL
  Filled 2014-11-15 (×4): qty 1

## 2014-11-15 MED ORDER — CETYLPYRIDINIUM CHLORIDE 0.05 % MT LIQD
7.0000 mL | Freq: Two times a day (BID) | OROMUCOSAL | Status: DC
  Administered 2014-11-15 – 2014-11-16 (×2): 7 mL via OROMUCOSAL

## 2014-11-15 MED ORDER — GLYCOPYRROLATE 0.2 MG/ML IJ SOLN
INTRAMUSCULAR | Status: DC | PRN
  Administered 2014-11-15: .6 mg via INTRAVENOUS

## 2014-11-15 MED ORDER — OMEPRAZOLE MAGNESIUM 20 MG PO TBEC: 20.0000 mg | DELAYED_RELEASE_TABLET | Freq: Every day | ORAL | Status: DC

## 2014-11-15 MED ORDER — TRAMADOL HCL 50 MG PO TABS: 50.0000 mg | ORAL_TABLET | Freq: Four times a day (QID) | ORAL | Status: DC | PRN

## 2014-11-15 MED ORDER — FENTANYL CITRATE (PF) 100 MCG/2ML IJ SOLN
INTRAMUSCULAR | Status: DC | PRN
  Administered 2014-11-15: 25 ug via INTRAVENOUS
  Administered 2014-11-15: 75 ug via INTRAVENOUS
  Administered 2014-11-15: 25 ug via INTRAVENOUS
  Administered 2014-11-15 (×2): 50 ug via INTRAVENOUS
  Administered 2014-11-15: 25 ug via INTRAVENOUS
  Administered 2014-11-15: 50 ug via INTRAVENOUS
  Administered 2014-11-15: 100 ug via INTRAVENOUS
  Administered 2014-11-15: 50 ug via INTRAVENOUS
  Administered 2014-11-15: 75 ug via INTRAVENOUS
  Administered 2014-11-15: 50 ug via INTRAVENOUS
  Administered 2014-11-15: 25 ug via INTRAVENOUS

## 2014-11-15 MED ORDER — ROCURONIUM BROMIDE 100 MG/10ML IV SOLN
INTRAVENOUS | Status: DC | PRN
  Administered 2014-11-15: 5 mg via INTRAVENOUS
  Administered 2014-11-15: 15 mg via INTRAVENOUS
  Administered 2014-11-15: 5 mg via INTRAVENOUS
  Administered 2014-11-15: 20 mg via INTRAVENOUS
  Administered 2014-11-15: 50 mg via INTRAVENOUS

## 2014-11-15 MED ORDER — ACETAMINOPHEN 160 MG/5ML PO SOLN
1000.0000 mg | Freq: Four times a day (QID) | ORAL | Status: DC
  Filled 2014-11-15 (×16): qty 40

## 2014-11-15 MED ORDER — LIDOCAINE HCL (CARDIAC) 20 MG/ML IV SOLN
INTRAVENOUS | Status: DC | PRN
  Administered 2014-11-15: 30 mg via INTRAVENOUS

## 2014-11-15 MED ORDER — GLYCOPYRROLATE 0.2 MG/ML IJ SOLN
INTRAMUSCULAR | Status: AC
  Filled 2014-11-15: qty 1

## 2014-11-15 MED ORDER — STERILE WATER FOR INJECTION IJ SOLN
INTRAMUSCULAR | Status: AC
Start: 2014-11-15 — End: 2014-11-15
  Filled 2014-11-15: qty 10

## 2014-11-15 MED ORDER — SENNOSIDES-DOCUSATE SODIUM 8.6-50 MG PO TABS
1.0000 | ORAL_TABLET | Freq: Every day | ORAL | Status: DC
  Administered 2014-11-15 – 2014-11-17 (×3): 1 via ORAL
  Filled 2014-11-15 (×4): qty 1

## 2014-11-15 MED ORDER — DIPHENHYDRAMINE HCL 12.5 MG/5ML PO ELIX
12.5000 mg | ORAL_SOLUTION | Freq: Four times a day (QID) | ORAL | Status: DC | PRN
  Filled 2014-11-15: qty 5

## 2014-11-15 MED ORDER — PANTOPRAZOLE SODIUM 40 MG PO TBEC
40.0000 mg | DELAYED_RELEASE_TABLET | Freq: Every day | ORAL | Status: DC
  Administered 2014-11-16 – 2014-11-18 (×3): 40 mg via ORAL
  Filled 2014-11-15 (×3): qty 1

## 2014-11-15 MED ORDER — PANTOPRAZOLE SODIUM 40 MG PO TBEC: 40.0000 mg | DELAYED_RELEASE_TABLET | Freq: Every day | ORAL | Status: DC

## 2014-11-15 MED ORDER — PHENYLEPHRINE HCL 10 MG/ML IJ SOLN
INTRAMUSCULAR | Status: DC | PRN
  Administered 2014-11-15 (×4): 40 ug via INTRAVENOUS

## 2014-11-15 MED ORDER — PROPOFOL 10 MG/ML IV BOLUS
INTRAVENOUS | Status: DC | PRN
  Administered 2014-11-15: 160 mg via INTRAVENOUS

## 2014-11-15 MED ORDER — PHENYLEPHRINE HCL 10 MG/ML IJ SOLN
10.0000 mg | INTRAMUSCULAR | Status: DC | PRN
  Administered 2014-11-15: 50 ug/min via INTRAVENOUS

## 2014-11-15 MED ORDER — GABAPENTIN 300 MG PO CAPS
900.0000 mg | ORAL_CAPSULE | Freq: Two times a day (BID) | ORAL | Status: DC
  Administered 2014-11-15 – 2014-11-18 (×6): 900 mg via ORAL
  Filled 2014-11-15 (×7): qty 3

## 2014-11-15 SURGICAL SUPPLY — 91 items
APPLICATOR TIP COSEAL (VASCULAR PRODUCTS) IMPLANT
APPLICATOR TIP EXT COSEAL (VASCULAR PRODUCTS) IMPLANT
BLADE SURG 11 STRL SS (BLADE) IMPLANT
BRUSH CYTOL CELLEBRITY 1.5X140 (MISCELLANEOUS) IMPLANT
CANISTER SUCTION 2500CC (MISCELLANEOUS) ×3 IMPLANT
CATH KIT ON Q 5IN SLV (PAIN MANAGEMENT) IMPLANT
CATH THORACIC 28FR (CATHETERS) IMPLANT
CATH THORACIC 36FR (CATHETERS) IMPLANT
CATH THORACIC 36FR RT ANG (CATHETERS) IMPLANT
CLIP TI MEDIUM 6 (CLIP) ×3 IMPLANT
CONN ST 1/4X3/8  BEN (MISCELLANEOUS) ×1
CONN ST 1/4X3/8 BEN (MISCELLANEOUS) ×2 IMPLANT
CONT SPEC 4OZ CLIKSEAL STRL BL (MISCELLANEOUS) ×12 IMPLANT
COVER TABLE BACK 60X90 (DRAPES) ×3 IMPLANT
DERMABOND ADVANCED (GAUZE/BANDAGES/DRESSINGS) ×1
DERMABOND ADVANCED .7 DNX12 (GAUZE/BANDAGES/DRESSINGS) ×2 IMPLANT
DRAIN CHANNEL 28F RND 3/8 FF (WOUND CARE) ×3 IMPLANT
DRAIN CHANNEL 32F RND 10.7 FF (WOUND CARE) IMPLANT
DRAPE LAPAROSCOPIC ABDOMINAL (DRAPES) ×3 IMPLANT
DRAPE WARM FLUID 44X44 (DRAPE) ×3 IMPLANT
DRILL BIT 7/64X5 (BIT) IMPLANT
ELECT BLADE 4.0 EZ CLEAN MEGAD (MISCELLANEOUS) ×3
ELECT REM PT RETURN 9FT ADLT (ELECTROSURGICAL) ×3
ELECTRODE BLDE 4.0 EZ CLN MEGD (MISCELLANEOUS) ×2 IMPLANT
ELECTRODE REM PT RTRN 9FT ADLT (ELECTROSURGICAL) ×2 IMPLANT
FORCEPS BIOP RJ4 1.8 (CUTTING FORCEPS) IMPLANT
GAUZE SPONGE 4X4 12PLY STRL (GAUZE/BANDAGES/DRESSINGS) ×3 IMPLANT
GLOVE BIO SURGEON STRL SZ 6 (GLOVE) ×3 IMPLANT
GLOVE BIO SURGEON STRL SZ 6.5 (GLOVE) ×15 IMPLANT
GLOVE BIOGEL PI IND STRL 6 (GLOVE) ×4 IMPLANT
GLOVE BIOGEL PI IND STRL 6.5 (GLOVE) ×4 IMPLANT
GLOVE BIOGEL PI IND STRL 7.0 (GLOVE) ×2 IMPLANT
GLOVE BIOGEL PI INDICATOR 6 (GLOVE) ×2
GLOVE BIOGEL PI INDICATOR 6.5 (GLOVE) ×2
GLOVE BIOGEL PI INDICATOR 7.0 (GLOVE) ×1
GOWN STRL REUS W/ TWL LRG LVL3 (GOWN DISPOSABLE) ×10 IMPLANT
GOWN STRL REUS W/TWL LRG LVL3 (GOWN DISPOSABLE) ×5
KIT BASIN OR (CUSTOM PROCEDURE TRAY) ×3 IMPLANT
KIT CLEAN ENDO COMPLIANCE (KITS) ×3 IMPLANT
KIT ROOM TURNOVER OR (KITS) ×3 IMPLANT
KIT SUCTION CATH 14FR (SUCTIONS) ×3 IMPLANT
MARKER SKIN DUAL TIP RULER LAB (MISCELLANEOUS) ×3 IMPLANT
NEEDLE BIOPSY TRANSBRONCH 21G (NEEDLE) IMPLANT
NS IRRIG 1000ML POUR BTL (IV SOLUTION) ×12 IMPLANT
OIL SILICONE PENTAX (PARTS (SERVICE/REPAIRS)) ×3 IMPLANT
PACK CHEST (CUSTOM PROCEDURE TRAY) ×3 IMPLANT
PAD ARMBOARD 7.5X6 YLW CONV (MISCELLANEOUS) ×12 IMPLANT
PASSER SUT SWANSON 36MM LOOP (INSTRUMENTS) IMPLANT
RELOAD GOLD ECHELON 45 (STAPLE) ×12 IMPLANT
SCISSORS LAP 5X35 DISP (ENDOMECHANICALS) ×3 IMPLANT
SEALANT PROGEL (MISCELLANEOUS) IMPLANT
SEALANT SURG COSEAL 4ML (VASCULAR PRODUCTS) IMPLANT
SEALANT SURG COSEAL 8ML (VASCULAR PRODUCTS) IMPLANT
SOLUTION ANTI FOG 6CC (MISCELLANEOUS) ×3 IMPLANT
STAPLER ECHELON POWERED (MISCELLANEOUS) ×3 IMPLANT
SUT PROLENE 3 0 SH DA (SUTURE) IMPLANT
SUT PROLENE 4 0 RB 1 (SUTURE)
SUT PROLENE 4-0 RB1 .5 CRCL 36 (SUTURE) IMPLANT
SUT SILK  1 MH (SUTURE) ×2
SUT SILK 1 MH (SUTURE) ×4 IMPLANT
SUT SILK 1 TIES 10X30 (SUTURE) ×3 IMPLANT
SUT SILK 2 0 SH (SUTURE) IMPLANT
SUT SILK 2 0SH CR/8 30 (SUTURE) ×3 IMPLANT
SUT SILK 3 0SH CR/8 30 (SUTURE) IMPLANT
SUT STEEL 1 (SUTURE) IMPLANT
SUT VIC AB 1 CTX 18 (SUTURE) ×6 IMPLANT
SUT VIC AB 1 CTX 36 (SUTURE)
SUT VIC AB 1 CTX36XBRD ANBCTR (SUTURE) IMPLANT
SUT VIC AB 2-0 CT1 27 (SUTURE) ×1
SUT VIC AB 2-0 CT1 TAPERPNT 27 (SUTURE) ×2 IMPLANT
SUT VIC AB 2-0 CTX 36 (SUTURE) IMPLANT
SUT VIC AB 2-0 UR6 27 (SUTURE) IMPLANT
SUT VIC AB 3-0 SH 8-18 (SUTURE) IMPLANT
SUT VIC AB 3-0 X1 27 (SUTURE) ×3 IMPLANT
SUT VICRYL 0 UR6 27IN ABS (SUTURE) IMPLANT
SUT VICRYL 2 TP 1 (SUTURE) IMPLANT
SWAB COLLECTION DEVICE MRSA (MISCELLANEOUS) IMPLANT
SYR 20ML ECCENTRIC (SYRINGE) ×3 IMPLANT
SYSTEM SAHARA CHEST DRAIN ATS (WOUND CARE) ×3 IMPLANT
TAPE CLOTH SURG 4X10 WHT LF (GAUZE/BANDAGES/DRESSINGS) ×3 IMPLANT
TAPE UMBILICAL COTTON 1/8X30 (MISCELLANEOUS) IMPLANT
TIP APPLICATOR SPRAY EXTEND 16 (VASCULAR PRODUCTS) IMPLANT
TOWEL OR 17X24 6PK STRL BLUE (TOWEL DISPOSABLE) ×6 IMPLANT
TOWEL OR 17X26 10 PK STRL BLUE (TOWEL DISPOSABLE) ×6 IMPLANT
TRAP SPECIMEN MUCOUS 40CC (MISCELLANEOUS) ×6 IMPLANT
TRAY FOLEY CATH 16FRSI W/METER (SET/KITS/TRAYS/PACK) ×3 IMPLANT
TROCAR XCEL BLUNT TIP 100MML (ENDOMECHANICALS) IMPLANT
TUBE ANAEROBIC SPECIMEN COL (MISCELLANEOUS) IMPLANT
TUBE CONNECTING 20X1/4 (TUBING) ×6 IMPLANT
TUNNELER SHEATH ON-Q 11GX8 DSP (PAIN MANAGEMENT) IMPLANT
WATER STERILE IRR 1000ML POUR (IV SOLUTION) ×6 IMPLANT

## 2014-11-15 NOTE — Anesthesia Postprocedure Evaluation (Signed)
  Anesthesia Post-op Note  Patient: Charles [name]  Procedure(s) Performed: Procedure(s): VIDEO BRONCHOSCOPY (N/A) VIDEO ASSISTED THORACOSCOPY (VATS)/LUNG RESECTION WITH RIGHT LINGULECTOMY (Right)  Patient Location: PACU  Anesthesia Type:General  Level of Consciousness: awake, alert  and oriented  Airway and Oxygen Therapy: Patient Spontanous Breathing and Patient connected to nasal cannula oxygen  Post-op Pain: mild  Post-op Assessment: Post-op Vital signs reviewed, Patient's Cardiovascular Status Stable, Respiratory Function Stable, Patent Airway and Pain level controlled              Post-op Vital Signs: stable  Last Vitals:  Filed Vitals:   11/15/14 2043  BP:   Pulse: 94  Temp:   Resp: 20    Complications: No apparent anesthesia complications

## 2014-11-15 NOTE — Anesthesia Procedure Notes (Addendum)
Procedure Name: Intubation Date/Time: 11/15/2014 8:41 AM Performed by: Susa Loffler Pre-anesthesia Checklist: Patient identified, Suction available, Patient being monitored, Timeout performed and Emergency Drugs available Patient Re-evaluated:Patient Re-evaluated prior to inductionOxygen Delivery Method: Circle system utilized Preoxygenation: Pre-oxygenation with 100% oxygen Intubation Type: IV induction Ventilation: Mask ventilation without difficulty and Oral airway inserted - appropriate to patient size Laryngoscope Size: Mac and 4 Grade View: Grade III Tube type: Oral Tube size: 8.5 mm Number of attempts: 1 Airway Equipment and Method: Stylet and Oral airway Placement Confirmation: ETT inserted through vocal cords under direct vision,  positive ETCO2 and breath sounds checked- equal and bilateral Secured at: 24 cm Tube secured with: Tape Dental Injury: Teeth and Oropharynx as per pre-operative assessment  Comments: Atraumatic intubation by Jamesetta Geralds    Procedure Name: Intubation Date/Time: 11/15/2014 9:06 AM Performed by: Susa Loffler Pre-anesthesia Checklist: Patient identified, Suction available, Patient being monitored, Emergency Drugs available and Timeout performed Patient Re-evaluated:Patient Re-evaluated prior to inductionOxygen Delivery Method: Circle system utilized Preoxygenation: Pre-oxygenation with 100% oxygen Laryngoscope Size: Mac and 4 Endobronchial tube: Left and EBT position confirmed by fiberoptic bronchoscope and 39 Fr Number of attempts: 1 Airway Equipment and Method: Stylet (cook catheter) Placement Confirmation: positive ETCO2,  breath sounds checked- equal and bilateral and ETT inserted through vocal cords under direct vision Secured at: 30 cm Tube secured with: Tape Dental Injury: Teeth and Oropharynx as per pre-operative assessment

## 2014-11-15 NOTE — H&P (Signed)
Charles [name] [address]       [place] [address]             [phone]                    Charles [name] [place] Medical Record #[id number] Date of Birth: 09-12-1950   Referring: Charles Ranks, MD Primary Care: Charles Chroman., MD  Chief Complaint:  Chief Complaint  Patient presents with  . Lung Lesion        History of Present Illness:    Charles [name] 64 y.o. male is seen in the office  today for lung mass.  Patient was  never a smoker he has history  of rectal cancer s/p resection in 2012 in [facility] (completed chemo 05/2011) , prior DVT (off anticoagulation since 2013) admitted 09/08/14 for acute recurrent DVT/PE. He was seen in the   at ER by Dr Elsworth Soho  with acute dyspnea. CTA of the chest at [facility] was positive for an acute PE. He was  Transferred  to  [facility], he was evaluated by IR for bilateral DVT's, PE, and what appeared to be a clotted IVC filter.CT of the head was negative for acute intracranial findings. On 5/7 LE venous duplex was positive for DVT in bilateral common femoral, profunda, femoral veins, DVT in right popliteal vein. He was taken to the IR suite 5/9 and had BLE DVT thrombolysis performed. Procedure was successful and pt was then transferred to ICU for close observation. The  CT of the chest also revealed incidental finding of a RML 2cm lung nodule and lymphadenopathy, worrisome for bronchogenic metastasis.     Rectal cancer   Staging form: Colon and Rectum, AJCC 7th Edition     Clinical: Stage IIIB (T3, N1, M0) - Signed by Charles Retort, MD on 02/04/2011     Pathologic: No stage assigned - Unsigned    Current Activity/ Functional Status:  Patient is independent with mobility/ambulation, transfers, ADL's, IADL's.   Zubrod Score: At the time of surgery this patient's most appropriate activity status/level should be described as: '[x]'$     0    Normal activity, no symptoms '[]'$     1    Restricted in  physical strenuous activity but ambulatory, able to do out light work '[]'$     2    Ambulatory and capable of self care, unable to do work activities, up and about               >50 % of waking hours                              '[]'$     3    Only limited self care, in bed greater than 50% of waking hours '[]'$     4    Completely disabled, no self care, confined to bed or chair '[]'$     5    Moribund   Past Medical History  Diagnosis Date  . HTN (hypertension)   . Pulmonary embolism   . GERD (gastroesophageal reflux disease)   . Depression 04/01/2011  . DVT (deep venous thrombosis) 05/09/2011  . 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 Colon cancer 07/24/10    rectal ca, inv adenocarcinoma  . Hx of radiation therapy 09/02/10 to 10/14/10    pelvis  . Rectal cancer 01/15/2011    S/P radiation and concurrent 5-FU continuous  infusion from 09/09/10- 10/10/10.  S/P proctectomy with colorectal anastomosis and diverting loop ileostomy on 11/14/10 at [facility] by Dr. Harlon Ditty. Pathology reveals a pT3b N1 with 3/20 lymph nodes.    . Lung metastasis   . Peripheral vascular disease     dvt's,pe  . Pneumonia     hx x3    Past Surgical History  Procedure Laterality Date  . Esophagogastroduodenoscopy  07/2010    RMR: schatki ring s/p dilation, small hh, SB bx benign  . Colonoscopy  07/2010    proximal rectal apple core mass 10-14cm from anal verge (adenocarcinoma), 2-3cm distal rectal carpet polyp s/p piecemeal snare polypectomy (adenoma)  . Eus  08/2010    Dr. Owens Loffler. uT3N0 circumferential, nearly obstruction rectosigmoid adenocarcinoma, distal edge 12cm from anal verge  . Ivc filter    . Colon surgery  11/14/2010    proctectomy with colorectal anastomosis and diverting loop ileostomy (temporary planned)  . Port a cath placement    . Colostomy reversal  april 2013  . Port-a-cath removal  09/24/2011    Procedure: REMOVAL PORT-A-CATH;  Surgeon: Charles Heinz, MD;  Location: [facility];  Service: General;   Laterality: N/A;  Minor Room  . Ivc filter    . Colonoscopy  04/21/2012    RMR: Friable,fibrotic appearing colorectal anastomosis producing some luminal narrowing-not felt to be critical. path: focal erosion with slight inflammation and hyperemia. SURVEILLANCE DUE DEC 2015  . Colonoscopy N/A 11/24/2013    Dr. Rourk:somewhat fibrotic/friable anastomotic mucosal-status post biopsy (narrowing not felt to be clinically significant) Single colonic diverticulum. benign polypoid rectal mucosa  . Hernia repair      abd hernia repair    Family History  Problem Relation Age of Onset  . Colon cancer Neg Hx   . Liver disease Neg Hx   . Inflammatory bowel disease Neg Hx   . Cancer Brother     throat  . Cancer Brother     prostate    History   Social History  . Marital Status: Married    Spouse Name: N/A  . Number of Children: 2  . Years of Education: N/A   Occupational History  . self-employed Regulatory affairs officer, currently not working    Social History Main Topics  . Smoking status: Never Smoker   . Smokeless tobacco: Never Used  . Alcohol Use: No  . Drug Use: No  . Sexual Activity: Not on file   Other Topics Concern  . Not on file   Social History Narrative    History  Smoking status  . Never Smoker   Smokeless tobacco  . Never Used    History  Alcohol Use No     Allergies  Allergen Reactions  . Trazodone And Nefazodone Other (See Comments)    hallucinations    Current Facility-Administered Medications  Medication Dose Route Frequency Provider Last Rate Last Dose  . cefUROXime (ZINACEF) 1.5 g in dextrose 5 % 50 mL IVPB  1.5 g Intravenous To SS-Surg [name], MD       Facility-Administered Medications Ordered in Other Encounters  Medication Dose Route Frequency Provider Last Rate Last Dose  . sodium chloride 0.9 % injection 10 mL  10 mL Intravenous PRN Everardo All, MD   10 mL at 08/22/11 1114      Review of Systems:     Cardiac Review  of Systems: Y or N  Chest Pain [ n   ]  Resting SOB [ n  ]  Exertional SOB  [ n ]  Orthopnea [n  ]   Pedal Edema Florencio.Farrier   ]    Palpitations [ n ] Syncope  [ n ]   Presyncope [ n  ]  General Review of Systems: [Y] = yes [  ]=no Constitional: recent weight change [n  ];  Wt loss over the last 3 months [   ] anorexia [  ]; fatigue [  ]; nausea [  ]; night sweats [  ]; fever [  ]; or chills [  ];          Dental: poor dentition[y  ]; Last Dentist visit:   Eye : blurred vision [  ]; diplopia [   ]; vision changes [  ];  Amaurosis fugax[  ]; Resp: cough [  ];  wheezing[  ];  hemoptysis[  ]; shortness of breath[  ]; paroxysmal nocturnal dyspnea[  ]; dyspnea on exertion[  ]; or orthopnea[  ];  GI:  gallstones[  ], vomiting[  ];  dysphagia[  ]; melena[  ];  hematochezia [  ]; heartburn[  ];   Hx of  Colonoscopy[  ]; GU: kidney stones [  ]; hematuria[  ];   dysuria [  ];  nocturia[  ];  history of     obstruction [  ]; urinary frequency [  ]             Skin: rash, swelling[  ];, hair loss[  ];  peripheral edema[  ];  or itching[  ]; Musculosketetal: myalgias[  ];  joint swelling[  ];  joint erythema[  ];  joint pain[  ];  back pain[  ];  Heme/Lymph: bruising[  ];  bleeding[  ];  anemia[  ];  Neuro: TIA[  ];  headaches[  ];  stroke[  ];  vertigo[  ];  seizures[  ];   paresthesias[ y ];  difficulty walking[n  ];  Psych:depression[  ]; anxiety[ y ];  Endocrine: diabetes[  ];  thyroid dysfunction[  ];  Immunizations: Flu up to date [ y ]; Pneumococcal up to date [ y ];  Other: Neuropathy from previous chemotherapy  Physical Exam: BP 154/86 mmHg  Pulse 67  Temp(Src) 97.1 F (36.2 C) (Oral)  Resp 18  Ht '6\' 2"'$  (1.88 m)  Wt 225 lb 2 oz (102.116 kg)  BMI 28.89 kg/m2  SpO2 96%  PHYSICAL EXAMINATION: General appearance: alert, cooperative, appears stated age and no distress Head: Normocephalic, without obvious abnormality, atraumatic Neck: no adenopathy, no carotid bruit, no JVD, supple, symmetrical, trachea  midline and thyroid not enlarged, symmetric, no tenderness/mass/nodules Lymph nodes: Cervical, supraclavicular, and axillary nodes normal. Resp: clear to auscultation bilaterally Back: symmetric, no curvature. ROM normal. No CVA tenderness. Cardio: regular rate and rhythm, S1, S2 normal, no murmur, click, rub or gallop GI: soft, non-tender; bowel sounds normal; no masses,  no organomegaly Extremities: extremities normal, atraumatic, no cyanosis or edema and Homans sign is negative, no sign of DVT Patient has no carotid bruits Mild pedal edema palpable DP and PT pulses bilaterally.   Diagnostic Studies & Laboratory data:     Recent Radiology Findings:  Dg Chest 2 View  11/14/2014   CLINICAL DATA:  Preoperative examination prior to bronchoscopy, history of hypertension, pulmonary nodule, pulmonary embolism, rectal malignancy.  EXAM: CHEST  2 VIEW  COMPARISON:  Chest x-Charles of October 24, 2014 and CT scan of the chest of November 13, 2014.  FINDINGS: The lungs are adequately  inflated. A pulmonary nodule previously demonstrated in the right middle lobe anteriorly is smaller and less well defined on today's study. The heart is normal in size. The pulmonary vascularity is not engorged. The trachea is midline. There is no pleural effusion. There is multilevel mild degenerative disc disease of the thoracic spine as well as calcification of portions of the anterior longitudinal ligament.  IMPRESSION: There is no pneumonia nor CHF. A known right middle lobe pulmonary nodule is less conspicuous on today's study.   Electronically Signed   By: David  Martinique M.D.   On: 11/14/2014 15:51   Ct Chest Wo Contrast  11/13/2014   CLINICAL DATA:  History colon cancer in 2012. Colostomy and reversal. Chemotherapy for 9 months. Lung nodule. Biopsy demonstrating colon cancer metastasis.  EXAM: CT CHEST WITHOUT CONTRAST  TECHNIQUE: Multidetector CT imaging of the chest was performed following the standard protocol without IV  contrast.  COMPARISON:  Biopsy of 10/28/2014. PET of 09/25/2014. Chest CT 09/08/2014  FINDINGS: Mediastinum/Nodes: No supraclavicular adenopathy. Heart size upper normal. Coronary artery atherosclerosis, including within the LAD and left circumflex. Partial anomalous pulmonary venous return to the left upper lobe.  9 mm subcarinal node is unchanged and not pathologic by size criteria. The previously described right hilar node lymph node is not well evaluated on today's noncontrast study.  Lungs/Pleura: No pleural fluid. Right middle lobe pulmonary nodule measures 1.5 x 1.6 cm on image 40 versus 1.9 x 1.8 cm on 09/08/2014. On sagittal reformatted image 39, measures 1.6 cm craniocaudal and contacts the right minor fissure. 2.0 cm craniocaudal on the prior.  No new nodules identified.  Upper abdomen: Mild hepatic steatosis. Normal imaged portions of the spleen, stomach, pancreas, gallbladder, biliary tract, gallbladder. Left adrenal calcification is not significantly changed. Normal rib right adrenal gland and imaged portions of the kidneys.  Musculoskeletal: Moderate mid thoracic spondylosis.  IMPRESSION: 1. Decreased size of a right middle lobe lung nodule. No new pulmonary metastasis identified. 2. No thoracic adenopathy. A subcarinal node is similar in size and not pathologic by size criteria. 3. Coronary artery atherosclerosis. 4. Mild hepatic steatosis.   Electronically Signed   By: Abigail Miyamoto M.D.   On: 11/13/2014 10:38    Ct Biopsy  10/24/2014   CLINICAL DATA:  64 year old male with a history of right-sided lung nodule, referred for nodule biopsy.  Nodule was avid on prior PET-CT.  EXAM: CT-GUIDED BIOPSY RIGHT MIDDLE LOBE NODULE  MEDICATIONS AND MEDICAL HISTORY: Versed 5.0 mg, Fentanyl 100 mcg.  Additional Medications: None.  ANESTHESIA/SEDATION: Moderate sedation time: 13 minutes  PROCEDURE: The procedure, risks, benefits, and alternatives were explained to the patient and the patient's family. Specific  risks discussed include bleeding, infection, pneumothorax, including asymptomatic pneumothorax which is expected, pulmonary hemorrhage, need for further procedure, such as a thoracostomy tube for treatment, cardiopulmonary collapse, death. Questions regarding the procedure were encouraged and answered. The patient understands and consents to the procedure.  The patient is position in the supine position on the CT gantry table. Scout image of the chest was performed.  The anterior right chest wall was prepped with Betadine in a sterile fashion, and a sterile drape was applied covering the operative field. A sterile gown and sterile gloves were used for the procedure.  Once the patient is prepped and draped in the usual sterile fashion, the skin and subcutaneous tissues were generously infiltrated with 1% lidocaine for local anesthesia. A small stab incision was made with 11 blade scalpel, and using CT  guidance, a guide needle was advanced to the anterior chest wall into the center of the lung nodule. The stylet was removed and 3 separate 18 gauge core biopsy were achieved.  The needle was then removed and a final CT image was performed.  The patient tolerated the procedure well and remained hemodynamically stable throughout.  No complications were encountered and no significant blood loss was encountered.  FINDINGS: CT image of the chest demonstrates right middle lobe nodule, similar to prior PET-CT.  Images during the case demonstrate needle tip within the nodule for biopsy.  Status post biopsy, there is a small pneumothorax which did not require treatment.  COMPLICATIONS: Small pneumothorax after the procedure. Patient was asymptomatic. SIR level A: No therapy, no consequence.  IMPRESSION: Status post biopsy of right-sided lung nodule, right middle lobe, with tissue specimen sent to pathology for complete histopathologic analysis.  Signed,  Dulcy Fanny. Earleen Newport, DO  [facility]   [facility]   Electronically Signed   By: Corrie Mckusick D.O.   On: 10/24/2014 15:05    CLINICAL DATA: Initial Treatment strategy for right middle lobe lung nodule. History of rectal cancer 07/24/2010. Radiation therapy in 2012. Recent pulmonary embolism.  EXAM: NUCLEAR MEDICINE PET SKULL BASE TO THIGH  TECHNIQUE: 11.4 mCi F-18 FDG was injected intravenously. Full-ring PET imaging was performed from the skull base to thigh after the radiotracer. CT data was obtained and used for attenuation correction and anatomic localization.  FASTING BLOOD GLUCOSE: Value: 99 mg/dl  COMPARISON: Chest abdomen and pelvic CTs of 09/08/2014. PET of 08/12/2010.  FINDINGS: NECK  No areas of abnormal hypermetabolism.  CHEST  Posterior right upper lobe patchy hypermetabolic airspace disease, including on image 31 of series 6. This is new since 09/08/2014, and measures a S.U.V. max of 6.0.  Hypermetabolic relatively well-circumscribed right middle lobe pulmonary nodule. 1.9 x 1.6 cm and a S.U.V. max of 3.3 on image 43.  High right paratracheal node is borderline to minimally enlarged at 8 mm. Measures a S.U.V. max of 2.6. This is similar to the mediastinal pool. Similarly, subcarinal node measures 9 mm and a S.U.V. max of 2.7 on image 81.  ABDOMEN/PELVIS  Right adrenal hypermetabolism is without CT correlate. Measures a S.U.V. max of 3.5.  Left adrenal hypermetabolism corresponds to minimal nodularity, on the order of 9 mm. This measures a S.U.V. max of 4.6, including on image 126. This left adrenal nodularity is felt to be similar to 08/12/2010. The hypermetabolism is new. medial limb left adrenal calcifications which are chronic.  SKELETON  No abnormal marrow activity.  CT IMAGES PERFORMED FOR ATTENUATION CORRECTION  No cervical adenopathy.  Chest, abdomen, and pelvic findings otherwise deferred to recent diagnostic CT. Partial anomalous pulmonary  venous return from the left upper lobe. LAD coronary artery atherosclerosis. IVC filter.  IMPRESSION: 1. The right middle lobe pulmonary nodule is hypermetabolic, favored to represent a primary bronchogenic carcinoma. 2. Equivocal mediastinal nodes, similar to surrounding blood pool. 3. Bilateral adrenal hypermetabolism. Absence of CT abnormality on the right, and similarity of an area of mild left adrenal nodularity on the left argues for a physiologic explanation. Small volume metastatic disease, especially to the left adrenal gland, cannot be entirely excluded. 4. New right upper lobe airspace disease with hypermetabolism since 09/08/2014. Most consistent with pneumonia.   Electronically Signed  By: Abigail Miyamoto M.D.  On: 09/25/2014 12:25  I have independently reviewed the above radiologic studies.  Recent Lab Findings: Lab Results  Component  Value Date   WBC 5.6 11/14/2014   HGB 14.1 11/14/2014   HCT 39.9 11/14/2014   PLT 224 11/14/2014   GLUCOSE 95 11/14/2014   ALT 18 11/14/2014   AST 17 11/14/2014   NA 137 11/14/2014   K 3.8 11/14/2014   CL 106 11/14/2014   CREATININE 0.73 11/14/2014   BUN 10 11/14/2014   CO2 21* 11/14/2014   TSH 1.219 01/15/2011   INR 1.12 11/14/2014   PFT's Pulmonary Function Diagnosis: Minimal Obstructive Airways Disease FEV1   3.4 85% DLCO 31.22 82%  Assessment / Plan:   1/ The right middle lobe pulmonary nodule is hypermetabolic,    ADENOCARCINOMA consistent with metastatic adenocarcinoma of a colorectal primary by biopsy 2/ Anticoagulation for history of DVT and caval filter placement- has been trasistied off coumadin, took last lovenox last pm 5 00, Patient has discussed with oncology and primary car usinf Xarelto later      Risk and options of surgical treatment of what appears to be a solitary pulmonary metastasis of adenocarcinoma of colorectal origin has been discussed with the patient in detail. Options including stereotactic  radiotherapy were also discussed with the patient. The patient is willing to proceed with resection of isolated pulmonary metastasis.  A repeat CT scan of the entire chest on July 11.  We will proceed with bronchoscopy right video-assisted thoracoscopy and resection of the pulmonary metastasis on July 13. The patient has  stop his Coumadin on July 9, have a pro time checked in Dr. Woody Seller office on Monday, July 11 and  staredt on Lovenox for the 11th and 12th, and none on July 13.   The goals risks and alternatives of the planned surgical procedure bronchoscopy and VATS, right lung resection  have been discussed with the patient in detail. The risks of the procedure including death, infection, stroke, myocardial infarction, bleeding, blood transfusion have all been discussed specifically.  I have quoted [name] a 3 % of perioperative mortality and a complication rate as high as 25 %. The patient's questions have been answered.KINGSLEE MAIRENA is willing  to proceed with the planned procedure.   [name] MD      [address] [place] [address] Office [phone]   Beeper [phone]  11/15/2014 7:27 AM

## 2014-11-15 NOTE — Transfer of Care (Signed)
Immediate Anesthesia Transfer of Care Note  Patient: Charles [name]  Procedure(s) Performed: Procedure(s): VIDEO BRONCHOSCOPY (N/A) VIDEO ASSISTED THORACOSCOPY (VATS)/LUNG RESECTION WITH RIGHT LINGULECTOMY (Right)  Patient Location: PACU  Anesthesia Type:General  Level of Consciousness: awake, alert  and oriented  Airway & Oxygen Therapy: Patient Spontanous Breathing and Patient connected to face mask oxygen  Post-op Assessment: Report given to RN, Post -op Vital signs reviewed and stable and Patient moving all extremities X 4  Post vital signs: Reviewed and stable  Last Vitals:  Filed Vitals:   11/15/14 0639  BP: 154/86  Pulse: 67  Temp: 36.2 C  Resp: 18    Complications: No apparent anesthesia complications

## 2014-11-15 NOTE — Anesthesia Preprocedure Evaluation (Addendum)
Anesthesia Evaluation  Patient identified by MRN, date of birth, ID band Patient awake    Reviewed: Allergy & Precautions, NPO status , Patient's Chart, lab work & pertinent test results, reviewed documented beta blocker date and time   History of Anesthesia Complications Negative for: history of anesthetic complications  Airway Mallampati: III  TM Distance: >3 FB Neck ROM: Full    Dental  (+) Poor Dentition, Missing, Chipped, Dental Advisory Given   Pulmonary shortness of breath and with exertion, PE breath sounds clear to auscultation        Cardiovascular Exercise Tolerance: Good hypertension, + Peripheral Vascular Disease and DVT (Thrombolysis in 5/16) Rhythm:Regular Rate:Normal     Neuro/Psych PSYCHIATRIC DISORDERS (depression, anxiety) Anxiety Depression  Neuromuscular disease (peripheral neuropathy from chemo)    GI/Hepatic Neg liver ROS, GERD-  Medicated and Controlled,  Endo/Other  negative endocrine ROS  Renal/GU negative Renal ROS     Musculoskeletal   Abdominal   Peds  Hematology   Anesthesia Other Findings   Reproductive/Obstetrics                            Anesthesia Physical Anesthesia Plan  ASA: III  Anesthesia Plan: General   Post-op Pain Management:    Induction: Intravenous  Airway Management Planned: Oral ETT and Double Lumen EBT  Additional Equipment: Arterial line, CVP and Ultrasound Guidance Line Placement  Intra-op Plan:   Post-operative Plan: Extubation in OR and Possible Post-op intubation/ventilation  Informed Consent: I have reviewed the patients History and Physical, chart, labs and discussed the procedure including the risks, benefits and alternatives for the proposed anesthesia with the patient or authorized representative who has indicated his/her understanding and acceptance.   Dental advisory given  Plan Discussed with: CRNA, Anesthesiologist  and Surgeon  Anesthesia Plan Comments:        Anesthesia Quick Evaluation

## 2014-11-15 NOTE — Brief Op Note (Addendum)
      [address]       ,[place] [address]             [phone]      11/15/2014  11:07 AM  PATIENT:  Charles [name]  64 y.o. male  PRE-OPERATIVE DIAGNOSIS:  PULMONARY NODULE, METASTATIC ADENOCARCINOMA- colon  POST-OPERATIVE DIAGNOSIS:  Same   PROCEDURE:  Procedure(s):  VIDEO BRONCHOSCOPY (N/A)  VIDEO ASSISTED THORACOSCOPY (VATS) -Right wedge resection right middle lobe with portion of Right Upper Lobe  SURGEON:  Surgeon(s) and Role:    * [name], MD - Primary  PHYSICIAN ASSISTANT: Ellwood Handler PA-C  ANESTHESIA:   general  EBL:  Total I/O In: -  Out: 225 [Urine:200; Blood:25]  BLOOD ADMINISTERED:none  DRAINS: 55 [address]   LOCAL MEDICATIONS USED:  NONE  SPECIMEN:  Source of Specimen:  wedge of right middle lobe and small portion of right upperr lobe  DISPOSITION OF SPECIMEN:  PATHOLOGY  COUNTS:  YES   DICTATION: .Dragon Dictation  PLAN OF CARE: Admit to inpatient   PATIENT DISPOSITION:  PACU - hemodynamically stable.   Delay start of Pharmacological VTE agent (>24hrs) due to surgical blood loss or risk of bleeding: yes

## 2014-11-16 ENCOUNTER — Encounter (HOSPITAL_COMMUNITY): Payer: Self-pay | Admitting: Cardiothoracic Surgery

## 2014-11-16 ENCOUNTER — Inpatient Hospital Stay (HOSPITAL_COMMUNITY): Payer: Medicare Other

## 2014-11-16 LAB — GLUCOSE, CAPILLARY: Glucose-Capillary: 168 mg/dL — ABNORMAL HIGH (ref 65–99)

## 2014-11-16 LAB — CBC
HCT: 41.9 % (ref 39.0–52.0)
Hemoglobin: 14.1 g/dL (ref 13.0–17.0)
MCH: 29.1 pg (ref 26.0–34.0)
MCHC: 33.7 g/dL (ref 30.0–36.0)
MCV: 86.6 fL (ref 78.0–100.0)
Platelets: 195 10*3/uL (ref 150–400)
RBC: 4.84 MIL/uL (ref 4.22–5.81)
RDW: 13.1 % (ref 11.5–15.5)
WBC: 9.9 10*3/uL (ref 4.0–10.5)

## 2014-11-16 LAB — BLOOD GAS, ARTERIAL
Acid-base deficit: 0.3 mmol/L (ref 0.0–2.0)
Bicarbonate: 25 mEq/L — ABNORMAL HIGH (ref 20.0–24.0)
O2 Content: 2 L/min
O2 Saturation: 93.5 %
Patient temperature: 98.6
TCO2: 26.5 mmol/L (ref 0–100)
pCO2 arterial: 49.3 mmHg — ABNORMAL HIGH (ref 35.0–45.0)
pH, Arterial: 7.325 — ABNORMAL LOW (ref 7.350–7.450)
pO2, Arterial: 75.5 mmHg — ABNORMAL LOW (ref 80.0–100.0)

## 2014-11-16 LAB — BASIC METABOLIC PANEL
Anion gap: 8 (ref 5–15)
BUN: 5 mg/dL — ABNORMAL LOW (ref 6–20)
CO2: 25 mmol/L (ref 22–32)
Calcium: 8.3 mg/dL — ABNORMAL LOW (ref 8.9–10.3)
Chloride: 100 mmol/L — ABNORMAL LOW (ref 101–111)
Creatinine, Ser: 0.65 mg/dL (ref 0.61–1.24)
GFR calc Af Amer: >60 mL/min (ref >60)
GFR calc non Af Amer: >60 mL/min (ref >60)
Glucose, Bld: 166 mg/dL — ABNORMAL HIGH (ref 65–99)
Potassium: 3.8 mmol/L (ref 3.5–5.1)
Sodium: 133 mmol/L — ABNORMAL LOW (ref 135–145)

## 2014-11-16 MED ORDER — ENOXAPARIN SODIUM 40 MG/0.4ML ~~LOC~~ SOLN
40.0000 mg | Freq: Two times a day (BID) | SUBCUTANEOUS | Status: DC
  Administered 2014-11-16 – 2014-11-17 (×3): 40 mg via SUBCUTANEOUS
  Filled 2014-11-16 (×4): qty 0.4

## 2014-11-16 NOTE — Op Note (Signed)
NAME:  Charles [name], Charles [name] NO.:  [id number]  MEDICAL RECORD NO.:  [id number]  LOCATION:  3S01C                        FACILITY:  [facility]  PHYSICIAN:  Lanelle Bal, MD    DATE OF BIRTH:  April 15, 1951  DATE OF PROCEDURE:  11/15/2014 DATE OF DISCHARGE:                              OPERATIVE REPORT   PREOPERATIVE DIAGNOSIS:  Right lung lesion, solitary pulmonary metastasis, previous colon cancer.  POSTOPERATIVE DIAGNOSIS:  Right lung lesion, solitary pulmonary metastasis, previous colon cancer.  SURGICAL PROCEDURE:  Bronchoscopy, right video-assisted thoracoscopy, wedge resection of right middle lobe.  SURGEON:  Lanelle Bal, MD.  FIRST ASSISTANT:  Providence Crosby, PA.  BRIEF HISTORY:  The patient is a 64 year old male with previous history of colon cancer and history of previous pulmonary emboli.  He had had respiratory problems in May and CT scan at that time showed a right lung nodule, solitary.  Further investigation including PET scan, and a needle biopsy of the lesion revealed solitary pulmonary metastasis from colon cancer.  The patient was referred by Oncology to consider resection.  Risks and options were discussed with the patient in detail and he was willing to proceed.  He then transitioned off his Coumadin and onto Lovenox preoperatively.  DESCRIPTION OF PROCEDURE:  With appropriate monitoring lines in place, the patient underwent general endotracheal anesthesia with a single- lumen endotracheal tube.  Appropriate time-out was performed and we proceeded with a video bronchoscopy both to the right and left tracheobronchial tree to the subsegmental level without evidence of endobronchial lesion.  The scope was removed.  A double-lumen endotracheal tube was placed.  The patient was turned in lateral decubitus position with the right side up.  A second time-out was performed.  The patient had been prepped and draped with Betadine.  A small port incision  was made approximately at the 6th intercostal space in the midaxillary line.  Through this port, we located the middle lobe and through a second port site more anteriorly, we were able to manipulate the lung and isolate the lesion.  There was a small amount of the upper lobe that was adhered to the mass.  A stapler was used to staple across this small attachment, although it looked more like adhesion than direct tumor extension.  After a small wedge of the upper lobe was taken, we then proceeded with a generous wedge resection of the right middle lobe using stapler. Specimen was removed through the port site and appropriately labeled for the margins and sent to Pathology for examination.  The staple margins were all negative by frozen section.  A chest tube was placed through one port site and the remaining port site was closed as the lung was reinflated.  The chest tube was secured in place.  The remaining port site was closed with interrupted 0 Vicryl and running 3-0 Vicryl in the subcutaneous tissue. Dry dressings were applied.  The patient was awakened in the operating room, extubated, and then transferred to the recovery room for postoperative care.  Sponge and needle count was reported as correct. Blood loss was minimal.     Lanelle Bal, MD     EG/MEDQ  D:  11/16/2014  T:  11/16/2014  Job:  [id number]

## 2014-11-16 NOTE — Progress Notes (Signed)
CM spoke with pharmacy regarding possible recommended dosage of Xarelto for pt  given pt's DVT hx. Pharmacy recommended Xarelto '20mg'$  daily secondary to dvt not being new.  Benefits check for Xarelto '20mg'$  daliy in process, CM to f/u with results. Whitman Hero RN,BSN,CM [phone]

## 2014-11-16 NOTE — Progress Notes (Addendum)
CM with benefit results  for Xarelto : XARELTO is covered. No Prior Authorization required. Patient will have a $40.00 Retail Pharmacy co-payment.  If prescribed CM will f/u with obtaining Xarelto 30 day free trail card for pt. No other needs identified @ present time. Whitman Hero RN, [place] [phone]

## 2014-11-16 NOTE — Care Management Note (Signed)
Case Management Note  Patient Details  Name: YAZAN GATLING MRN: [id number] Date of Birth: 1951/02/13  Subjective/Objective:      From home with wife s/p VATS procedure with lung resection 11/15/14.    Action/Plan:  Return to home when medically stable. CM to f/u with d/c needs Expected Discharge Date:                  Expected Discharge Plan:  Home/Self Care  In-House Referral:     Discharge planning Services  CM Consult  Post Acute Care Choice:    Choice offered to:     DME Arranged:    DME Agency:     HH Arranged:    HH Agency:     Status of Service:  In process, will continue to follow  Medicare Important Message Given:    Date Medicare IM Given:    Medicare IM give by:    Date Additional Medicare IM Given:    Additional Medicare Important Message give by:     If discussed at [place] of Stay Meetings, dates discussed:    Additional Comments: Lamine Laton (Spouse)  [phone]  Sharin Mons, RN 11/16/2014, 12:33 PM

## 2014-11-16 NOTE — Progress Notes (Addendum)
[address]       [place] [address]             [phone]          1 Day Post-Op Procedure(s) (LRB): VIDEO BRONCHOSCOPY (N/A) VIDEO ASSISTED THORACOSCOPY (VATS)/LUNG RESECTION WITH RIGHT LINGULECTOMY (Right)  Subjective: OOB in chair.  Feels well, breathing stable.  Had some nausea overnight, but resolved today.   Objective: Vital signs in last 24 hours: Patient Vitals for the past 24 hrs:  BP Temp Temp src Pulse Resp SpO2 Height Weight  11/16/14 0752 - - - - - 95 % - -  11/16/14 0600 133/73 mmHg - - 85 20 98 % - -  11/16/14 0500 137/70 mmHg - - 84 18 97 % - -  11/16/14 0400 133/79 mmHg - - 87 13 97 % - -  11/16/14 0335 - - - - 17 97 % - -  11/16/14 0300 119/74 mmHg 98.5 F (36.9 C) Oral 87 12 96 % - -  11/16/14 0200 139/73 mmHg - - 88 20 98 % - -  11/16/14 0100 121/75 mmHg - - 92 13 97 % - -  11/16/14 0000 133/81 mmHg - - 89 11 98 % - -  11/15/14 2300 118/89 mmHg 97.9 F (36.6 C) Tympanic 89 15 98 % - -  11/15/14 2200 115/69 mmHg - - 94 18 97 % - -  11/15/14 2100 120/70 mmHg - - 87 13 97 % - -  11/15/14 2043 - - - 94 20 98 % - -  11/15/14 2000 127/75 mmHg - - 86 14 96 % - -  11/15/14 1900 124/81 mmHg 97.5 F (36.4 C) Oral 86 15 96 % - -  11/15/14 1800 127/77 mmHg - - - - - - -  11/15/14 1600 139/80 mmHg - - - - - - -  11/15/14 1531 - - - - 20 - - -  11/15/14 1500 139/75 mmHg (!) 96.5 F (35.8 C) Axillary 80 12 97 % - -  11/15/14 1400 (!) 143/71 mmHg 97.7 F (36.5 C) Oral 91 - 96 % '6\' 2"'$  (1.88 m) 226 lb 3.1 oz (102.6 kg)  11/15/14 1336 - 97.5 F (36.4 C) - - - - - -  11/15/14 1332 124/68 mmHg - - 66 14 97 % - -  11/15/14 1330 - - - 88 15 97 % - -  11/15/14 1317 139/65 mmHg - - (!) 58 14 97 % - -  11/15/14 1315 - - - (!) 55 (!) 9 96 % - -  11/15/14 1302 131/77 mmHg - - 64 14 95 % - -  11/15/14 1300 - - - (!) 56 16 96 % - -  11/15/14 1247 (!) 141/73 mmHg - - 66 17 96 % - -  11/15/14 1245 - 97.5 F (36.4 C) - (!) 53 14 98 % - -    11/15/14 1236 - - - - 14 98 % - -  11/15/14 1232 129/79 mmHg - - (!) 54 12 96 % - -  11/15/14 1230 - - - (!) 52 15 99 % - -  11/15/14 1217 140/71 mmHg - - 61 13 99 % - -  11/15/14 1215 - - - 62 16 100 % - -  11/15/14 1202 (!) 144/62 mmHg - - (!) 56 12 100 % - -  11/15/14 1200 - - - 65 14 99 % - -  11/15/14 1147 (!) 141/77  mmHg - - (!) 56 18 100 % - -  11/15/14 1145 - - - (!) 53 10 100 % - -  11/15/14 1134 (!) 144/71 mmHg 97 F (36.1 C) - (!) 54 13 100 % - -   Current Weight  11/15/14 226 lb 3.1 oz (102.6 kg)     Intake/Output from previous day: 07/13 0701 - 07/14 0700 In: 2485 [I.V.:2435; IV Piggyback:50] Out: 1155 [Urine:1000; Blood:25; Chest Tube:130]    PHYSICAL EXAM:  Heart: RRR Lungs: Clear Wound: Dressed and dry Chest tube: No air leak    Lab Results: CBC: Recent Labs  11/14/14 1504 11/16/14 0315  WBC 5.6 9.9  HGB 14.1 14.1  HCT 39.9 41.9  PLT 224 195   BMET:  Recent Labs  11/14/14 1504 11/16/14 0315  NA 137 133*  K 3.8 3.8  CL 106 100*  CO2 21* 25  GLUCOSE 95 166*  BUN 10 5*  CREATININE 0.73 0.65  CALCIUM 8.5* 8.3*    PT/INR:  Recent Labs  11/14/14 1504  LABPROT 14.6  INR 1.12   CXR: FINDINGS: Right IJ line in stable position. Two right chest tube is been slightly withdrawn. No pneumothorax. Stable cardiomegaly. Bibasilar atelectasis and/or infiltrates. No pleural effusion or pneumothorax.  IMPRESSION: 1. Right chest tube is been slightly withdrawn. No pneumothorax. Right IJ line in stable position. 2. Low lung volumes with bibasilar subsegmental atelectasis and/or infiltrates again noted . 3. Stable cardiomegaly .   Assessment/Plan: S/P Procedure(s) (LRB): VIDEO BRONCHOSCOPY (N/A) VIDEO ASSISTED THORACOSCOPY (VATS)/LUNG RESECTION WITH RIGHT LINGULECTOMY (Right) Pulm- very stable, sats excellent on 1-2 L,. Continue pulm toilet/IS. CT with no air leak, output fairly low. CXR stable. Likely will be able to place CT to water  seal. H/o DVT- Coumadin/Lovenox on hold while CT in place.  Reportedly pt to switch to Xarelto. Will hold on anticoagulants for now. D/c a-line, mobilize, decrease IVF, routine POD #1 progression.    LOS: 1 day    COLLINS,GINA H 11/16/2014 Chest tube to water seal I have seen and examined Charles [name] and agree with the above assessment  and plan.  [name] MD Beeper [phone] Office [phone] 11/16/2014 9:10 AM

## 2014-11-17 ENCOUNTER — Inpatient Hospital Stay (HOSPITAL_COMMUNITY): Payer: Medicare Other

## 2014-11-17 LAB — COMPREHENSIVE METABOLIC PANEL
ALT: 20 U/L (ref 17–63)
AST: 16 U/L (ref 15–41)
Albumin: 2.9 g/dL — ABNORMAL LOW (ref 3.5–5.0)
Alkaline Phosphatase: 57 U/L (ref 38–126)
Anion gap: 5 (ref 5–15)
BUN: 5 mg/dL — ABNORMAL LOW (ref 6–20)
CO2: 29 mmol/L (ref 22–32)
Calcium: 8.4 mg/dL — ABNORMAL LOW (ref 8.9–10.3)
Chloride: 101 mmol/L (ref 101–111)
Creatinine, Ser: 0.67 mg/dL (ref 0.61–1.24)
GFR calc Af Amer: >60 mL/min (ref >60)
GFR calc non Af Amer: >60 mL/min (ref >60)
Glucose, Bld: 140 mg/dL — ABNORMAL HIGH (ref 65–99)
Potassium: 3.8 mmol/L (ref 3.5–5.1)
Sodium: 135 mmol/L (ref 135–145)
Total Bilirubin: 0.8 mg/dL (ref 0.3–1.2)
Total Protein: 5.6 g/dL — ABNORMAL LOW (ref 6.5–8.1)

## 2014-11-17 LAB — CBC
HCT: 38.4 % — ABNORMAL LOW (ref 39.0–52.0)
Hemoglobin: 13 g/dL (ref 13.0–17.0)
MCH: 29.2 pg (ref 26.0–34.0)
MCHC: 33.9 g/dL (ref 30.0–36.0)
MCV: 86.3 fL (ref 78.0–100.0)
Platelets: 172 10*3/uL (ref 150–400)
RBC: 4.45 MIL/uL (ref 4.22–5.81)
RDW: 13 % (ref 11.5–15.5)
WBC: 7 10*3/uL (ref 4.0–10.5)

## 2014-11-17 MED ORDER — RIVAROXABAN 20 MG PO TABS
20.0000 mg | ORAL_TABLET | Freq: Every day | ORAL | Status: DC
  Administered 2014-11-17: 20 mg via ORAL
  Filled 2014-11-17 (×2): qty 1

## 2014-11-17 MED ORDER — LEVALBUTEROL HCL 0.63 MG/3ML IN NEBU: 0.6300 mg | INHALATION_SOLUTION | Freq: Four times a day (QID) | RESPIRATORY_TRACT | Status: DC | PRN

## 2014-11-17 NOTE — Progress Notes (Addendum)
[address]       [place] [address]             [phone]          2 Days Post-Op Procedure(s) (LRB): VIDEO BRONCHOSCOPY (N/A) VIDEO ASSISTED THORACOSCOPY (VATS)/LUNG RESECTION WITH RIGHT LINGULECTOMY (Right)  Subjective: Feels well, no complaints.  Breathing stable, walked in halls already.   Objective: Vital signs in last 24 hours: Patient Vitals for the past 24 hrs:  BP Temp Temp src Pulse Resp SpO2  11/17/14 0800 - - - 79 19 93 %  11/17/14 0744 - - - - - 93 %  11/17/14 0715 127/76 mmHg 97.9 F (36.6 C) Oral 85 (!) 23 92 %  11/17/14 0400 - - - 80 10 93 %  11/17/14 0319 134/69 mmHg 98.7 F (37.1 C) Oral 91 15 94 %  11/17/14 0135 - - - - - 92 %  11/17/14 0000 - - - 88 13 91 %  11/16/14 2338 (!) 141/74 mmHg 98.7 F (37.1 C) Oral (!) 103 19 93 %  11/16/14 2000 - - - 91 19 91 %  11/16/14 1852 (!) 149/79 mmHg 98.3 F (36.8 C) Oral 100 19 92 %  11/16/14 1800 (!) 151/81 mmHg - - 94 (!) 22 96 %  11/16/14 1700 (!) 145/68 mmHg - - - - -  11/16/14 1600 - - - - 20 97 %  11/16/14 1500 (!) 142/72 mmHg 98.4 F (36.9 C) Oral 95 (!) 23 96 %  11/16/14 1447 - - - - - 96 %  11/16/14 1400 140/72 mmHg - - 83 15 96 %  11/16/14 1200 129/74 mmHg - - - 17 98 %  11/16/14 1100 (!) 145/83 mmHg 98.3 F (36.8 C) Oral 82 18 96 %  11/16/14 1000 119/65 mmHg - - 90 (!) 24 95 %  11/16/14 0900 (!) 148/75 mmHg - - (!) 105 15 95 %   Current Weight  11/15/14 226 lb 3.1 oz (102.6 kg)     Intake/Output from previous day: 07/14 0701 - 07/15 0700 In: 1300 [P.O.:300; I.V.:1000] Out: 4097 [Urine:3150; Chest Tube:180]    PHYSICAL EXAM:  Heart: RRR Lungs: Clear Wound: Clean and dry Chest tube: No air leak    Lab Results: CBC: Recent Labs  11/16/14 0315 11/17/14 0430  WBC 9.9 7.0  HGB 14.1 13.0  HCT 41.9 38.4*  PLT 195 172   BMET:  Recent Labs  11/16/14 0315 11/17/14 0430  NA 133* 135  K 3.8 3.8  CL 100* 101  CO2 25 29  GLUCOSE 166* 140*  BUN  5* 5*  CREATININE 0.65 0.67  CALCIUM 8.3* 8.4*    PT/INR:  Recent Labs  11/14/14 1504  LABPROT 14.6  INR 1.12    CXR: FINDINGS: The lungs remain mildly hypoinflated. There is persistent bibasilar atelectasis greater on the right than on the left. A small right apical pneumothorax is now visible. The right-sided chest tube has been partially withdrawn with its tip lying at the lung base. The cardiac silhouette remains enlarged. The pulmonary vascularity is not engorged. There is no significant pleural effusion. The right internal jugular venous catheter tip projects over the midportion of the SVC. The bony thorax exhibits no acute abnormality.  IMPRESSION: Small (less than 10%) right apical pneumothorax now visible. Persistent bibasilar atelectasis greater on the right than on the left.  Path: METASTATIC ADENOCARCINOMA, CONSISTENT WITH COLORECTAL PRIMARY, SPANNING 2.0 CM. - THE SURGICAL RESECTION  MARGINS ARE NEGATIVE FOR ADENOCARCINOMA   Assessment/Plan: S/P Procedure(s) (LRB): VIDEO BRONCHOSCOPY (N/A) VIDEO ASSISTED THORACOSCOPY (VATS)/LUNG RESECTION WITH RIGHT LINGULECTOMY (Right) CT output with no air leak, CXR with small ptx. Hopefully can d/c CT today. Continue pulm toilet/IS, ambulation. Pt not using PCA at all, so will d/c PCA and saline lock IV.   LOS: 2 days    [name],Charles H 11/17/2014  Path noted and discussed with patient  Diagnosis Lung, wedge biopsy/resection, right lingula and small portion of middle lobe - METASTATIC ADENOCARCINOMA, CONSISTENT WITH COLORECTAL PRIMARY, SPANNING 2.0 CM. - THE SURGICAL RESECTION MARGINS ARE NEGATIVE FOR ADENOCARCINOMA. - SEE COMMENT. Microscopic Comment Additional studies can be performed upon clinician request. (JBK:kh 11-16-14) Enid Cutter MD  Chest tube out  [place] home in am Will start  [place], rather then coumadin  I have seen and examined Charles [name] and agree with the above assessment  and  plan.  [name] MD Beeper [phone] Office [phone] 11/17/2014 11:58 AM

## 2014-11-17 NOTE — Discharge Instructions (Addendum)
Information on my medicine - XARELTO (Rivaroxaban)  This medication education was reviewed with me or my healthcare representative as part of my discharge preparation.  The pharmacist that spoke with me during my hospital stay was:  Deboraha Sprang, [facility]  Why was Xarelto prescribed for you? Xarelto was prescribed for you to reduce the risk of a blood clot forming in your lungs and legs.  What do you need to know about xarelto ?  Take your Xarelto ONCE DAILY at the same time every day with your evening meal. If you have difficulty swallowing the tablet whole, you may crush it and mix in applesauce just prior to taking your dose.  Take Xarelto exactly as prescribed by your doctor and DO NOT stop taking Xarelto without talking to the doctor who prescribed the medication.  Stopping without other stroke prevention medication to take the place of Xarelto may increase your risk of developing a clot that causes a stroke.  Refill your prescription before you run out.  After discharge, you should have regular check-up appointments with your healthcare provider that is prescribing your Xarelto.  In the future your dose may need to be changed if your kidney function or weight changes by a significant amount.  What do you do if you miss a dose? If you are taking Xarelto ONCE DAILY and you miss a dose, take it as soon as you remember on the same day then continue your regularly scheduled once daily regimen the next day. Do not take two doses of Xarelto at the same time or on the same day.   Important Safety Information A possible side effect of Xarelto is bleeding. You should call your healthcare provider right away if you experience any of the following: ? Bleeding from an injury or your nose that does not stop. ? Unusual colored urine (red or dark brown) or unusual colored stools (red or black). ? Unusual bruising for unknown reasons. ? A serious fall or if you hit your head (even if  there is no bleeding).  Some medicines may interact with Xarelto and might increase your risk of bleeding while on Xarelto. To help avoid this, consult your healthcare provider or pharmacist prior to using any new prescription or non-prescription medications, including herbals, vitamins, non-steroidal anti-inflammatory drugs (NSAIDs) and supplements.  This website has more information on Xarelto: [email or web address].[email or web address]  Video-Assisted Thoracic Surgery Care After Refer to this sheet in the next few weeks. These instructions provide you with information on caring for yourself after your procedure. Your caregiver may also give you more specific instructions. Your procedure has been planned according to current medical practices, but problems sometimes occur. Call your caregiver if you have any problems or questions after your procedure. HOME CARE INSTRUCTIONS   Only take over-the-counter or prescription medications as directed.  Only take pain medications (narcotics) as directed.  Do not drive until your caregiver approves. Driving while taking narcotics or soon after surgery can be dangerous, so discuss the specific timing with your caregiver.  Avoid activities that use your chest muscles, such as lifting heavy objects, for at least 3-4 weeks.   Take deep breaths to expand the lungs and to protect against pneumonia.  Do breathing exercises as directed by your caregiver. If you were given an incentive spirometer to help with breathing, use it as directed.  You may resume a normal diet and activities when you feel you are able to or as directed.  Do not take a bath until your  caregiver says it is OK. Use the shower instead.   Keep the bandage (dressing) covering the area where the chest tube was inserted (incision site) dry for 48 hours. After 48 hours, remove the dressing unless there is new drainage.  Remove dressings as directed by your caregiver.  Change dressings if necessary or as  directed.  Keep all follow-up appointments. It is important for you to see your caregiver after surgery to discuss appropriate follow-up care and surveillance, if it is necessary. SEEK MEDICAL CARE:  You feel excessive or increasing pain at an incision site.  You notice bleeding, skin irritation, drainage, swelling, or redness at an incision site.  There is a bad smell coming from an incision or dressing.  It feels like your heart is fluttering or beating rapidly.  Your pain medication does not relieve your pain. SEEK IMMEDIATE MEDICAL CARE IF:   You have a fever.   You have chest pain.  You have a rash.  You have shortness of breath.  You have trouble breathing.   You feel weak, lightheaded, dizzy, or faint.  MAKE SURE YOU:   Understand these instructions.   Will watch your condition.   Will get help right away if you are not doing well or get worse. Document Released: 08/16/2012 Document Reviewed: 08/16/2012 [facility] Patient Information 2015 [place]. This information is not intended to replace advice given to you by your health care provider. Make sure you discuss any questions you have with your health care provider.

## 2014-11-17 NOTE — Care Management Important Message (Signed)
Important Message  Patient Details  Name: Charles [name] MRN: [id number] Date of Birth: 07/11/1950   Medicare Important Message Given:  Yes-second notification given    Delorse Lek 11/17/2014, 12:20 PM

## 2014-11-17 NOTE — Discharge Summary (Signed)
[address]       [place] [address]             [phone]              Discharge Summary  Name: Charles [name] DOB: 05-21-1950 64 y.o. MRN: [id number]   Admission Date: 11/15/2014 Discharge Date: 11/18/2014    Admitting Diagnosis: Right lung mass   Discharge Diagnosis:  Metastatic adenocarcinoma  Past Medical History  Diagnosis Date  . HTN (hypertension)   . Pulmonary embolism   . GERD (gastroesophageal reflux disease)   . Depression 04/01/2011  . DVT (deep venous thrombosis) 05/09/2011  . 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 Colon cancer 07/24/10    rectal ca, inv adenocarcinoma  . Hx of radiation therapy 09/02/10 to 10/14/10    pelvis  . Rectal cancer 01/15/2011    S/P radiation and concurrent 5-FU continuous infusion from 09/09/10- 10/10/10.  S/P proctectomy with colorectal anastomosis and diverting loop ileostomy on 11/14/10 at [facility] by Dr. Harlon Ditty. Pathology reveals a pT3b N1 with 3/20 lymph nodes.    . Lung metastasis   . Peripheral vascular disease     dvt's,pe  . Pneumonia     hx x3     Procedures: VIDEO BRONCHOSCOPY RIGHT VIDEO ASSISTED THORACOSCOPY  WEDGE RESECTION RIGHT MIDDLE LOBE -  11/15/2014    HPI:  The patient is a 64 y.o. male with a history of rectal cancer,  s/p resection in 2012 at [facility] (completed chemo 05/2011) , prior DVT (off anticoagulation since 2013) who was admitted 09/08/14 for acute recurrent DVT/PE. He was seen in theER at [facility] with acute dyspnea. CTA of the chest  was positive for an acute PE.[facility] and was evaluated by Interventional Radiology for bilateral DVT's, PE, and what appeared to be a clotted IVC filter.CT of the head was negative for acute intracranial findings. On 5/7, lower extremity venous duplex was found to be positive for DVT in bilateral common femoral, profunda, femoral veins, and DVT in right popliteal vein. He was taken to the IR  suite on 5/9 and had bilateral lower extremity DVT thrombolysis performed. The procedure was successful and pt was then transferred to ICU for close observation. The CT of the chest also revealed an incidental  2cm right middle lobe lung nodule and lymphadenopathy, worrisome for bronchogenic metastasis. A PET scan was performed which showed hypermetabolic activity in the right middle lobe lesion.  The patient was referred to Dr. Servando Snare for thoracic surgical evaluation.  A needle biopsy of the lesion was positive for adenocarcinoma consistent with metastatic adenocarcinoma from a colorectal primary.  It was felt that he should undergo surgical resection at this time. All risks, benefits and alternatives of surgery were explained in detail, and the patient agreed to proceed. Coumadin was held and Lovenox was started in anticipation of surgery,    Hospital Course:  The patient was admitted to [facility] on 11/15/2014. The patient was taken to the operating room and underwent the above procedure.    The postoperative course has generally been uneventful.  His chest tube was weaned from suction and had no air leak.  Chest x-ray showed a small apical pneumothorax, which has remained stable.  Chest tube was able to be discontinued on  11/17/2014.  The patient has otherwise done well. He has been weaned from supplemental oxygen and incisions are healing well.  He is tolerating a  regular diet and is ambulating in the halls without difficulty.  Pathology confirms a metastatic adenocarcinoma consistent with colorectal primary.  Follow up chest x-rays have remained stable.  He has been continued on Lovenox postoperatively for DVT prophylaxis and will be started on Xarelto per his primary MD at discharge. The patient is medically stable on today's date for discharge home.    Recent vital signs:  Filed Vitals:   11/18/14 0746  BP:   Pulse:   Temp: 98.2 F (36.8 C)  Resp:     Recent laboratory studies:    CBC:  Recent Labs  11/16/14 0315 11/17/14 0430  WBC 9.9 7.0  HGB 14.1 13.0  HCT 41.9 38.4*  PLT 195 172   BMET:   Recent Labs  11/16/14 0315 11/17/14 0430  NA 133* 135  K 3.8 3.8  CL 100* 101  CO2 25 29  GLUCOSE 166* 140*  BUN 5* 5*  CREATININE 0.65 0.67  CALCIUM 8.3* 8.4*    PT/INR: No results for input(s): LABPROT, INR in the last 72 hours.   Discharge Medications:     Medication List    STOP taking these medications        enoxaparin 100 MG/ML injection  Commonly known as:  LOVENOX     warfarin 5 MG tablet  Commonly known as:  COUMADIN      TAKE these medications        ALPRAZolam 0.25 MG tablet  Commonly known as:  XANAX  Take 1 tablet (0.25 mg total) by mouth 3 (three) times daily as needed for anxiety.     clonazePAM 0.5 MG tablet  Commonly known as:  KLONOPIN  Take 0.5 mg by mouth 2 (two) times daily as needed for anxiety.     diphenhydramine-acetaminophen 25-500 MG Tabs  Commonly known as:  TYLENOL PM  Take 1 tablet by mouth at bedtime as needed (sleep).     escitalopram 10 MG tablet  Commonly known as:  LEXAPRO  Take 1 tablet (10 mg total) by mouth daily.     finasteride 5 MG tablet  Commonly known as:  PROSCAR  Take 5 mg by mouth daily.     gabapentin 300 MG capsule  Commonly known as:  NEURONTIN  Take 3 capsules (900 mg total) by mouth 2 (two) times daily.     omeprazole 20 MG tablet  Commonly known as:  PRILOSEC OTC  Take 20 mg by mouth daily.     oxyCODONE 5 MG immediate release tablet  Commonly known as:  Oxy IR/ROXICODONE  Take 1-2 tablets (5-10 mg total) by mouth every 4 (four) hours as needed for severe pain.     psyllium 58.6 % powder  Commonly known as:  METAMUCIL  Take 1 packet by mouth 3 (three) times daily.     rivaroxaban 20 MG Tabs tablet  Commonly known as:  XARELTO  Take 1 tablet (20 mg total) by mouth daily with supper.     tamsulosin 0.4 MG Caps capsule  Commonly known as:  FLOMAX  Take 1 capsule (0.4  mg total) by mouth daily.     traMADol 50 MG tablet  Commonly known as:  ULTRAM  Take 1-2 tablets (50-100 mg total) by mouth every 6 (six) hours as needed (mild pain).     vitamin B-12 1000 MCG tablet  Commonly known as:  CYANOCOBALAMIN  Take 1,000 mcg by mouth daily.     vitamin E 400 UNIT capsule  Take 400 Units by  mouth daily.         Discharge Instructions:  The patient is to refrain from driving, heavy lifting or strenuous activity.  May shower daily and clean incisions with soap and water.  May resume regular diet.   Follow Up: Follow-up Information    Follow up with [facility] RN On 11/24/2014.   Why:  Appointment is at 10:00 for suture removal      Follow up with [name], MD On 12/12/2014.   Specialty:  Cardiothoracic Surgery   Why:  Appointment is at 4:00   Contact information:   [address] [place] [address] [phone]         Follow-up Information    Follow up with [facility] RN On 11/24/2014.   Why:  Appointment is at 10:00 for suture removal      Follow up with [name], MD On 12/12/2014.   Specialty:  Cardiothoracic Surgery   Why:  Appointment is at 4:00   Contact information:   [address] [place] [address] [phone]        Ellwood Handler 11/18/2014, 10:57 AM

## 2014-11-17 NOTE — Progress Notes (Addendum)
Wasted 30cc fentanyl PCA; witness Dorene Grebe, RN and Pecolia Ades, RN.

## 2014-11-17 NOTE — Consult Note (Signed)
ANTICOAGULATION CONSULT NOTE - Initial Consult  Pharmacy Consult for Xarelto Indication: hx DVT/PE  Allergies  Allergen Reactions  . Trazodone And Nefazodone Other (See Comments)    hallucinations    Patient Measurements: Height: '6\' 2"'$  (188 cm) Weight: 226 lb 3.1 oz (102.6 kg) IBW/kg (Calculated) : 82.2  Vital Signs: Temp: 97.9 F (36.6 C) (07/15 0715) Temp Source: Oral (07/15 0715) BP: 127/76 mmHg (07/15 0715) Pulse Rate: 79 (07/15 0800)  Labs:  Recent Labs  11/14/14 1504 11/16/14 0315 11/17/14 0430  HGB 14.1 14.1 13.0  HCT 39.9 41.9 38.4*  PLT 224 195 172  APTT 39*  --   --   LABPROT 14.6  --   --   INR 1.12  --   --   CREATININE 0.73 0.65 0.67    Estimated Creatinine Clearance: 119.3 mL/min (by C-G formula based on Cr of 0.67).   Medical History: Past Medical History  Diagnosis Date  . HTN (hypertension)   . Pulmonary embolism   . GERD (gastroesophageal reflux disease)   . Depression 04/01/2011  . DVT (deep venous thrombosis) 05/09/2011  . 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 Colon cancer 07/24/10    rectal ca, inv adenocarcinoma  . Hx of radiation therapy 09/02/10 to 10/14/10    pelvis  . Rectal cancer 01/15/2011    S/P radiation and concurrent 5-FU continuous infusion from 09/09/10- 10/10/10.  S/P proctectomy with colorectal anastomosis and diverting loop ileostomy on 11/14/10 at [facility] by Dr. Harlon Ditty. Pathology reveals a pT3b N1 with 3/20 lymph nodes.    . Lung metastasis   . Peripheral vascular disease     dvt's,pe  . Pneumonia     hx x3   Assessment: 64yom on coumadin pta for recurrent DVT/PE (09/08/14). Coumadin stopped and he was bridged with lovenox '100mg'$  q12 pending lobectomy. He underwent lobectomy on 7/13. Low dose lovenox '40mg'$  q12 was resumed on 7/14 but not coumadin. He will now be transitioned to xarelto for VTE prophylaxis. CrCl > 151m/min. Last lovenox dose today at 1015. CBC stable  Goal of Therapy:  Monitor platelets by  anticoagulation protocol: Yes   Plan:  1) Xarelto '20mg'$  daily 2) Case management consult already ordered 3) Will need education  MDeboraha Sprang7/15/2016,10:36 AM

## 2014-11-18 ENCOUNTER — Inpatient Hospital Stay (HOSPITAL_COMMUNITY): Payer: Medicare Other

## 2014-11-18 MED ORDER — OXYCODONE HCL 5 MG PO TABS: 5.0000 mg | ORAL_TABLET | ORAL | Status: DC | PRN

## 2014-11-18 MED ORDER — TRAMADOL HCL 50 MG PO TABS: 50.0000 mg | ORAL_TABLET | Freq: Four times a day (QID) | ORAL | Status: DC | PRN

## 2014-11-18 MED ORDER — RIVAROXABAN 20 MG PO TABS: 20.0000 mg | ORAL_TABLET | Freq: Every day | ORAL | Status: DC

## 2014-11-18 NOTE — Progress Notes (Signed)
Pt given discharge packet, medication regimen reviewed and education given. Pt states no further questions at this time. Will continue to monitor.

## 2014-11-18 NOTE — Progress Notes (Signed)
      [address]       [place] [address]             [phone]      3 Days Post-Op Procedure(s) (LRB): VIDEO BRONCHOSCOPY (N/A) VIDEO ASSISTED THORACOSCOPY (VATS)/LUNG RESECTION WITH RIGHT LINGULECTOMY (Right)   Subjective:  Charles [name] states he is doing well this morning.  He has no complaints.  Objective: Vital signs in last 24 hours: Temp:  [97.9 F (36.6 C)-98.3 F (36.8 C)] 98.2 F (36.8 C) (07/16 0746) Pulse Rate:  [70-91] 89 (07/16 0315) Cardiac Rhythm:  [-] Normal sinus rhythm (07/16 0315) Resp:  [11-27] 18 (07/16 0315) BP: (132-144)/(68-80) 144/80 mmHg (07/16 0315) SpO2:  [92 %-98 %] 93 % (07/16 0315)  Intake/Output from previous day: 07/15 0701 - 07/16 0700 In: 1160 [P.O.:960; I.V.:200] Out: 2870 [Urine:2850; Chest Tube:20]  General appearance: alert, cooperative and no distress Heart: regular rate and rhythm Lungs: clear to auscultation bilaterally Abdomen: soft, non-tender; bowel sounds normal; no masses,  no organomegaly Wound: clean and dry  Lab Results:  Recent Labs  11/16/14 0315 11/17/14 0430  WBC 9.9 7.0  HGB 14.1 13.0  HCT 41.9 38.4*  PLT 195 172   BMET:  Recent Labs  11/16/14 0315 11/17/14 0430  NA 133* 135  K 3.8 3.8  CL 100* 101  CO2 25 29  GLUCOSE 166* 140*  BUN 5* 5*  CREATININE 0.65 0.67  CALCIUM 8.3* 8.4*    PT/INR: No results for input(s): LABPROT, INR in the last 72 hours. ABG    Component Value Date/Time   PHART 7.325* 11/16/2014 0328   HCO3 25.0* 11/16/2014 0328   TCO2 26.5 11/16/2014 0328   ACIDBASEDEF 0.3 11/16/2014 0328   O2SAT 93.5 11/16/2014 0328   CBG (last 3)   Recent Labs  11/16/14 0721  GLUCAP 168*    Assessment/Plan: S/P Procedure(s) (LRB): VIDEO BRONCHOSCOPY (N/A) VIDEO ASSISTED THORACOSCOPY (VATS)/LUNG RESECTION WITH RIGHT LINGULECTOMY (Right)  1. Chest tube removed yesterday- CXR remains stable, tiny apical pneumothorax on the right which is unchanged 2. Dispo-  patient stable, will d/c home today   LOS: 3 days    Charles [name], Charles [name] 11/18/2014

## 2014-11-22 NOTE — Progress Notes (Signed)
Reviewed & agree with plan  

## 2014-11-23 ENCOUNTER — Encounter (HOSPITAL_COMMUNITY): Payer: Self-pay | Admitting: Emergency Medicine

## 2014-11-23 ENCOUNTER — Telehealth: Payer: Self-pay

## 2014-11-23 ENCOUNTER — Observation Stay (HOSPITAL_COMMUNITY)
Admission: EM | Admit: 2014-11-23 | Discharge: 2014-11-23 | Disposition: A | Discharge status: Home / Self Care | Payer: Medicare Other | Attending: Emergency Medicine | Admitting: Emergency Medicine

## 2014-11-23 DIAGNOSIS — K921 Melena: Secondary | ICD-10-CM | POA: Diagnosis not present

## 2014-11-23 DIAGNOSIS — K2970 Gastritis, unspecified, without bleeding: Secondary | ICD-10-CM

## 2014-11-23 DIAGNOSIS — Z86711 Personal history of pulmonary embolism: Secondary | ICD-10-CM | POA: Diagnosis not present

## 2014-11-23 DIAGNOSIS — R109 Unspecified abdominal pain: Secondary | ICD-10-CM | POA: Diagnosis not present

## 2014-11-23 DIAGNOSIS — I10 Essential (primary) hypertension: Secondary | ICD-10-CM | POA: Diagnosis not present

## 2014-11-23 DIAGNOSIS — Z86718 Personal history of other venous thrombosis and embolism: Secondary | ICD-10-CM | POA: Insufficient documentation

## 2014-11-23 DIAGNOSIS — Z9889 Other specified postprocedural states: Secondary | ICD-10-CM

## 2014-11-23 DIAGNOSIS — K5900 Constipation, unspecified: Secondary | ICD-10-CM | POA: Insufficient documentation

## 2014-11-23 DIAGNOSIS — Z923 Personal history of irradiation: Secondary | ICD-10-CM | POA: Insufficient documentation

## 2014-11-23 DIAGNOSIS — F329 Major depressive disorder, single episode, unspecified: Secondary | ICD-10-CM | POA: Insufficient documentation

## 2014-11-23 DIAGNOSIS — K922 Gastrointestinal hemorrhage, unspecified: Secondary | ICD-10-CM | POA: Diagnosis present

## 2014-11-23 DIAGNOSIS — Z885 Allergy status to narcotic agent status: Secondary | ICD-10-CM | POA: Insufficient documentation

## 2014-11-23 DIAGNOSIS — Z79899 Other long term (current) drug therapy: Secondary | ICD-10-CM | POA: Diagnosis not present

## 2014-11-23 DIAGNOSIS — R195 Other fecal abnormalities: Secondary | ICD-10-CM | POA: Diagnosis not present

## 2014-11-23 DIAGNOSIS — Z85048 Personal history of other malignant neoplasm of rectum, rectosigmoid junction, and anus: Secondary | ICD-10-CM | POA: Insufficient documentation

## 2014-11-23 DIAGNOSIS — K219 Gastro-esophageal reflux disease without esophagitis: Secondary | ICD-10-CM | POA: Diagnosis not present

## 2014-11-23 DIAGNOSIS — Z7902 Long term (current) use of antithrombotics/antiplatelets: Secondary | ICD-10-CM | POA: Diagnosis not present

## 2014-11-23 DIAGNOSIS — Z7901 Long term (current) use of anticoagulants: Secondary | ICD-10-CM

## 2014-11-23 LAB — COMPREHENSIVE METABOLIC PANEL
ALT: 32 U/L (ref 17–63)
AST: 18 U/L (ref 15–41)
Albumin: 3.6 g/dL (ref 3.5–5.0)
Alkaline Phosphatase: 78 U/L (ref 38–126)
Anion gap: 9 (ref 5–15)
BUN: 15 mg/dL (ref 6–20)
CO2: 26 mmol/L (ref 22–32)
Calcium: 9.1 mg/dL (ref 8.9–10.3)
Chloride: 106 mmol/L (ref 101–111)
Creatinine, Ser: 0.89 mg/dL (ref 0.61–1.24)
GFR calc Af Amer: >60 mL/min (ref >60)
GFR calc non Af Amer: >60 mL/min (ref >60)
Glucose, Bld: 124 mg/dL — ABNORMAL HIGH (ref 65–99)
Potassium: 3.8 mmol/L (ref 3.5–5.1)
Sodium: 141 mmol/L (ref 135–145)
Total Bilirubin: 0.7 mg/dL (ref 0.3–1.2)
Total Protein: 6.7 g/dL (ref 6.5–8.1)

## 2014-11-23 LAB — POC OCCULT BLOOD, ED: Fecal Occult Bld: POSITIVE — AB

## 2014-11-23 LAB — CBC
HCT: 42.1 % (ref 39.0–52.0)
Hemoglobin: 14.2 g/dL (ref 13.0–17.0)
MCH: 29 pg (ref 26.0–34.0)
MCHC: 33.7 g/dL (ref 30.0–36.0)
MCV: 85.9 fL (ref 78.0–100.0)
Platelets: 278 10*3/uL (ref 150–400)
RBC: 4.9 MIL/uL (ref 4.22–5.81)
RDW: 12.8 % (ref 11.5–15.5)
WBC: 7.9 10*3/uL (ref 4.0–10.5)

## 2014-11-23 LAB — PROTIME-INR
INR: 1.17 (ref 0.00–1.49)
Prothrombin Time: 15.1 seconds (ref 11.6–15.2)

## 2014-11-23 LAB — TYPE AND SCREEN
ABO/RH(D): O POS
Antibody Screen: NEGATIVE

## 2014-11-23 LAB — APTT: aPTT: 36 seconds (ref 24–37)

## 2014-11-23 MED ORDER — BACITRACIN ZINC 500 UNIT/GM EX OINT
TOPICAL_OINTMENT | CUTANEOUS | Status: AC
  Filled 2014-11-23: qty 0.9

## 2014-11-23 MED ORDER — SODIUM CHLORIDE 0.9 % IV SOLN: 8.0000 mg | Freq: Once | INTRAVENOUS | Status: DC

## 2014-11-23 MED ORDER — ONDANSETRON HCL 4 MG/2ML IJ SOLN: 4.0000 mg | Freq: Once | INTRAMUSCULAR | Status: DC

## 2014-11-23 MED ORDER — MUPIROCIN 2 % EX OINT: TOPICAL_OINTMENT | Freq: Two times a day (BID) | CUTANEOUS | Status: DC

## 2014-11-23 MED ORDER — FAMOTIDINE IN NACL 20-0.9 MG/50ML-% IV SOLN: 20.0000 mg | INTRAVENOUS | Status: DC

## 2014-11-23 MED ORDER — PANTOPRAZOLE SODIUM 40 MG IV SOLR: 40.0000 mg | INTRAVENOUS | Status: DC

## 2014-11-23 MED ORDER — FAMOTIDINE 20 MG PO TABS: 20.0000 mg | ORAL_TABLET | Freq: Two times a day (BID) | ORAL | Status: DC

## 2014-11-23 MED ORDER — SENNA 8.6 MG PO TABS
2.0000 | ORAL_TABLET | Freq: Once | ORAL | Status: AC
  Administered 2014-11-23: 17.2 mg via ORAL

## 2014-11-23 MED ORDER — FAMOTIDINE 20 MG PO TABS
20.0000 mg | ORAL_TABLET | ORAL | Status: AC
  Administered 2014-11-23: 20 mg via ORAL
  Filled 2014-11-23: qty 1

## 2014-11-23 NOTE — ED Provider Notes (Addendum)
CSN: [id number]     Arrival date & time 11/23/14  1626 History   First MD Initiated Contact with Patient 11/23/14 1755     Chief Complaint  Patient presents with  . Abdominal Pain     (Consider location/radiation/quality/duration/timing/severity/associated sxs/prior Treatment) HPI Comments: The patient is a 64 year old male, he has a history of rectal cancer which was diagnosed and treated with surgery chemotherapy and radiation in 2012. He had an ostomy and has subsequently had a takedown and anastomosis. He was then diagnosed with lung metastatic disease from his rectal cancer and has undergone surgery last week. He reports that he was on Coumadin for DVTs prior to surgery, this was stopped for surgery but postoperatively he was restarted on Lovenox and Xarelto. He currently takes Xarelto every day. He did not have a bowel movement for 4 days postoperatively, he then had a couple of yellow stools and has since had a large amount of black stool over the last 2 days. He denies any lightheadedness or shortness of breath, he was having abdominal cramping but this has currently stopped.  Patient is a 64 y.o. male presenting with abdominal pain. The history is provided by the patient.  Abdominal Pain   Past Medical History  Diagnosis Date  . HTN (hypertension)   . Pulmonary embolism   . GERD (gastroesophageal reflux disease)   . Depression 04/01/2011  . DVT (deep venous thrombosis) 05/09/2011  . 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 Colon cancer 07/24/10    rectal ca, inv adenocarcinoma  . Hx of radiation therapy 09/02/10 to 10/14/10    pelvis  . Rectal cancer 01/15/2011    S/P radiation and concurrent 5-FU continuous infusion from 09/09/10- 10/10/10.  S/P proctectomy with colorectal anastomosis and diverting loop ileostomy on 11/14/10 at [facility] by Dr. Harlon Ditty. Pathology reveals a pT3b N1 with 3/20 lymph nodes.    . Lung metastasis   . Peripheral vascular disease     dvt's,pe  . Pneumonia     hx x3   Past Surgical History  Procedure Laterality Date  . Esophagogastroduodenoscopy  07/2010    RMR: schatki ring s/p dilation, small hh, SB bx benign  . Colonoscopy  07/2010    proximal rectal apple core mass 10-14cm from anal verge (adenocarcinoma), 2-3cm distal rectal carpet polyp s/p piecemeal snare polypectomy (adenoma)  . Eus  08/2010    Dr. Owens Loffler. uT3N0 circumferential, nearly obstruction rectosigmoid adenocarcinoma, distal edge 12cm from anal verge  . Ivc filter    . Colon surgery  11/14/2010    proctectomy with colorectal anastomosis and diverting loop ileostomy (temporary planned)  . Port a cath placement    . Colostomy reversal  april 2013  . Port-a-cath removal  09/24/2011    Procedure: REMOVAL PORT-A-CATH;  Surgeon: Donato Heinz, MD;  Location: [facility];  Service: General;  Laterality: N/A;  Minor Room  . Ivc filter    . Colonoscopy  04/21/2012    RMR: Friable,fibrotic appearing colorectal anastomosis producing some luminal narrowing-not felt to be critical. path: focal erosion with slight inflammation and hyperemia. SURVEILLANCE DUE DEC 2015  . Colonoscopy N/A 11/24/2013    Dr. Rourk:somewhat fibrotic/friable anastomotic mucosal-status post biopsy (narrowing not felt to be clinically significant) Single colonic diverticulum. benign polypoid rectal mucosa  . Hernia repair      abd hernia repair  . Video bronchoscopy N/A 11/15/2014    Procedure: VIDEO BRONCHOSCOPY;  Surgeon: [name], MD;  Location: [place];  Service:  Thoracic;  Laterality: N/A;  . Video assisted thoracoscopy (vats)/wedge resection Right 11/15/2014    Procedure: VIDEO ASSISTED THORACOSCOPY (VATS)/LUNG RESECTION WITH RIGHT LINGULECTOMY;  Surgeon: [name], MD;  Location: [place];  Service: Thoracic;  Laterality: Right;   Family History  Problem Relation Age of Onset  . Colon cancer Neg Hx   . Liver disease Neg Hx   . Inflammatory bowel disease Neg Hx   . Cancer Brother     throat  .  Cancer Brother     prostate   History  Substance Use Topics  . Smoking status: Never Smoker   . Smokeless tobacco: Never Used  . Alcohol Use: No    Review of Systems  Gastrointestinal: Positive for abdominal pain.  All other systems reviewed and are negative.     Allergies  Oxycodone; Tramadol; and Trazodone and nefazodone  Home Medications   Prior to Admission medications   Medication Sig Start Date End Date Taking? Authorizing Provider  ALPRAZolam (XANAX) 0.25 MG tablet Take 1 tablet (0.25 mg total) by mouth 3 (three) times daily as needed for anxiety. 11/13/14   [name], MD  clonazePAM (KLONOPIN) 0.5 MG tablet Take 0.5 mg by mouth 2 (two) times daily as needed for anxiety.     Historical Provider, MD  diphenhydramine-acetaminophen (TYLENOL PM) 25-500 MG TABS Take 1 tablet by mouth at bedtime as needed (sleep).     Historical Provider, MD  escitalopram (LEXAPRO) 10 MG tablet Take 1 tablet (10 mg total) by mouth daily. 11/13/14   [name], MD  gabapentin (NEURONTIN) 300 MG capsule Take 3 capsules (900 mg total) by mouth 2 (two) times daily. 09/15/14   Donita Brooks, NP  omeprazole (PRILOSEC OTC) 20 MG tablet Take 20 mg by mouth daily.    Historical Provider, MD  oxyCODONE (OXY IR/ROXICODONE) 5 MG immediate release tablet Take 1-2 tablets (5-10 mg total) by mouth every 4 (four) hours as needed for severe pain. 11/18/14   Erin R Barrett, PA-C  psyllium (METAMUCIL) 58.6 % powder Take 1 packet by mouth 3 (three) times daily.    Historical Provider, MD  rivaroxaban (XARELTO) 20 MG TABS tablet Take 1 tablet (20 mg total) by mouth daily with supper. 11/18/14   Erin R Barrett, PA-C  tamsulosin (FLOMAX) 0.4 MG CAPS capsule Take 1 capsule (0.4 mg total) by mouth daily. 09/15/14   Donita Brooks, NP  traMADol (ULTRAM) 50 MG tablet Take 1-2 tablets (50-100 mg total) by mouth every 6 (six) hours as needed (mild pain). 11/18/14   Erin R Barrett, PA-C  vitamin B-12 (CYANOCOBALAMIN)  1000 MCG tablet Take 1,000 mcg by mouth daily.    Historical Provider, MD  vitamin E 400 UNIT capsule Take 400 Units by mouth daily.    Historical Provider, MD   BP 145/83 mmHg  Pulse 92  Temp(Src) 98.6 F (37 C)  Resp 18  Ht '6\' 2"'$  (1.88 m)  Wt 217 lb (98.431 kg)  BMI 27.85 kg/m2  SpO2 96% Physical Exam  Constitutional: He appears well-developed and well-nourished. No distress.  HENT:  Head: Normocephalic and atraumatic.  Mouth/Throat: Oropharynx is clear and moist. No oropharyngeal exudate.  Eyes: Conjunctivae and EOM are normal. Pupils are equal, round, and reactive to light. Right eye exhibits no discharge. Left eye exhibits no discharge. No scleral icterus.  Neck: Normal range of motion. Neck supple. No JVD present. No thyromegaly present.  Cardiovascular: Normal rate, regular rhythm, normal heart sounds and intact  distal pulses.  Exam reveals no gallop and no friction rub.   No murmur heard. Pulmonary/Chest: Effort normal and breath sounds normal. No respiratory distress. He has no wheezes. He has no rales.  Abdominal: Soft. Bowel sounds are normal. He exhibits no distension and no mass. There is no tenderness.  Musculoskeletal: Normal range of motion. He exhibits no edema or tenderness.  Lymphadenopathy:    He has no cervical adenopathy.  Neurological: He is alert. Coordination normal.  Skin: Skin is warm and dry. No rash noted. No erythema.  Psychiatric: He has a normal mood and affect. His behavior is normal.  Nursing note and vitals reviewed.   ED Course  Procedures (including critical care time) Labs Review Labs Reviewed  COMPREHENSIVE METABOLIC PANEL - Abnormal; Notable for the following:    Glucose, Bld 124 (*)    All other components within normal limits  POC OCCULT BLOOD, ED - Abnormal; Notable for the following:    Fecal Occult Bld POSITIVE (*)    All other components within normal limits  CBC  PROTIME-INR  APTT  TYPE AND SCREEN    Imaging Review No  results found.    MDM   Final diagnoses:  Melena    Abdominal exam is benign, vital signs are normal, stool is immediately heme occult positive but hemoglobin is normal. He will be observed in the hospital overnight to evaluate for drop in hemoglobin and potential need for intervention. Until that time Pepcid and Protonix have been given. Discussed with the hospitalist will see the patient in the emergency department for admission. Holding orders written.  She was seen and examined by the hospitalist, he recommends that the patient can be discharged home and follow up for repeat hemoglobin in the morning. I will start the patient on anti-histamine gastrointestinal protective medications, patient is in agreement with the plan.  Noemi Chapel, MD 11/23/14 1821  Noemi Chapel, MD 11/23/14 2005

## 2014-11-23 NOTE — Telephone Encounter (Signed)
Pt had 2 bms today. They were somewhat soft but dark. Not diarrhea.

## 2014-11-23 NOTE — Discharge Instructions (Signed)
The internal medicine doctor has  cleared U for discharge. Please start taking the medication as prescribed, follow up with your Dr. within 24 hours for a repeat hemoglobin level. Please return to the hospital if you develop worsening abdominal pain or increasing blood in the stools, lightheadedness or difficulty breathing. I'm just going to ask her

## 2014-11-23 NOTE — ED Notes (Signed)
Pt reports lower abdominal pain, black stools since Saturday. Pt reports was recently switched from coumadin to xarelto. Pt denies any dizziness.

## 2014-11-23 NOTE — Consult Note (Signed)
[facility] Consult Note  Charles [name] MGQ:[id number] DOB: 1950-10-05 DOA: 11/23/2014  Referring physician: Dr. Sabra Heck - [facility] PCP: [name], MD   Chief Complaint: Abd cramping and black stool  HPI: Charles [name] is a 64 y.o. male  Patient complaining of abdominal cramping and black stools. Patient states that his abdominal cramping started approximately 1 week ago after he was admitted for thoracotomy. Of note patient is status post lung mass excision one week ago. At time of surgery patient had come off of his Coumadin. Patient was restarted on Lovenox and then Xarelto postop. Patient states that after surgery he was unable to have a bowel movement for 3-4 days. Patient started taking MiraLAX and then stopped his narcotic pain medications. Patient then started having small yellowish greenish bowel movements. Patient took Pepto-Bismol to aid his abdominal crampy sensation which is mildly improved symptoms. The day following ingestion of Pepto-Bismol patient states he had to softer greenish bowel movements. Patient states today he had 2 very firm small black bowel movements. This concerned patient so he called his primary care physician, GI doctor, and surgeon. He was told to come to the emergency room. Patient wanted to be able to provide a bowel sample so he strained for a significant amount of time before being able to provide a sample to bring to the emergency room. This particular sample unlike the other bowel movements have a fair amount of liquid with it but was also black.  Patient denies any lower extremity swelling, chest pain, shortness of breath, wound dehiscence, wound healing, increasing tenderness at wound site, vomiting, fevers, dysuria, frequency, back pain, neck stiffness, headache, reflux, hemoptysis. Patient tolerating oral intake with no change in appetite.   Review of Systems:  HEENT:  No headaches, Difficulty swallowing,Tooth/dental problems,Sore throat,   No sneezing, itching, ear ache, nasal congestion, post nasal drip,  Cardio-vascular:  No chest pain, Orthopnea, PND, swelling in lower extremities, anasarca, dizziness, palpitations  GI:  Per hpi Resp:   No shortness of breath with exertion or at rest. No excess mucus, no productive cough, No non-productive cough, No coughing up of blood.No change in color of mucus.No wheezing.No chest wall deformity  Skin:  no rash or lesions.  GU:  no dysuria, change in color of urine, no urgency or frequency. No flank pain.  Musculoskeletal:   No joint pain or swelling. No decreased range of motion. No back pain.  Psych:  No change in mood or affect. No depression or anxiety. No memory loss.   Past Medical History  Diagnosis Date  . HTN (hypertension)   . Pulmonary embolism   . GERD (gastroesophageal reflux disease)   . Depression 04/01/2011  . DVT (deep venous thrombosis) 05/09/2011  . 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 Colon cancer 07/24/10    rectal ca, inv adenocarcinoma  . Hx of radiation therapy 09/02/10 to 10/14/10    pelvis  . Rectal cancer 01/15/2011    S/P radiation and concurrent 5-FU continuous infusion from 09/09/10- 10/10/10.  S/P proctectomy with colorectal anastomosis and diverting loop ileostomy on 11/14/10 at [facility] by Dr. Harlon Ditty. Pathology reveals a pT3b N1 with 3/20 lymph nodes.    . Lung metastasis   . Peripheral vascular disease     dvt's,pe  . Pneumonia     hx x3   Past Surgical History  Procedure Laterality Date  . Esophagogastroduodenoscopy  07/2010    RMR: schatki ring s/p dilation, small hh, SB bx benign  .  Colonoscopy  07/2010    proximal rectal apple core mass 10-14cm from anal verge (adenocarcinoma), 2-3cm distal rectal carpet polyp s/p piecemeal snare polypectomy (adenoma)  . Eus  08/2010    Dr. Owens Loffler. uT3N0 circumferential, nearly obstruction rectosigmoid adenocarcinoma, distal edge 12cm from anal verge  . Ivc filter    . Colon surgery  11/14/2010      proctectomy with colorectal anastomosis and diverting loop ileostomy (temporary planned)  . Port a cath placement    . Colostomy reversal  april 2013  . Port-a-cath removal  09/24/2011    Procedure: REMOVAL PORT-A-CATH;  Surgeon: Donato Heinz, MD;  Location: [facility];  Service: General;  Laterality: N/A;  Minor Room  . Ivc filter    . Colonoscopy  04/21/2012    RMR: Friable,fibrotic appearing colorectal anastomosis producing some luminal narrowing-not felt to be critical. path: focal erosion with slight inflammation and hyperemia. SURVEILLANCE DUE DEC 2015  . Colonoscopy N/A 11/24/2013    Dr. Rourk:somewhat fibrotic/friable anastomotic mucosal-status post biopsy (narrowing not felt to be clinically significant) Single colonic diverticulum. benign polypoid rectal mucosa  . Hernia repair      abd hernia repair  . Video bronchoscopy N/A 11/15/2014    Procedure: VIDEO BRONCHOSCOPY;  Surgeon: [name], MD;  Location: [facility] OR;  Service: Thoracic;  Laterality: N/A;  . Video assisted thoracoscopy (vats)/wedge resection Right 11/15/2014    Procedure: VIDEO ASSISTED THORACOSCOPY (VATS)/LUNG RESECTION WITH RIGHT LINGULECTOMY;  Surgeon: [name], MD;  Location: [facility];  Service: Thoracic;  Laterality: Right;   Social History:  reports that he has never smoked. He has never used smokeless tobacco. He reports that he does not drink alcohol or use illicit drugs.  Allergies  Allergen Reactions  . Oxycodone     Blisters, hallucinations  . Tramadol     Blisters, hallucinations  . Trazodone And Nefazodone Other (See Comments)    hallucinations    Family History  Problem Relation Age of Onset  . Colon cancer Neg Hx   . Liver disease Neg Hx   . Inflammatory bowel disease Neg Hx   . Cancer Brother     throat  . Cancer Brother     prostate     Prior to Admission medications   Medication Sig Start Date End Date Taking? Authorizing Provider  ALPRAZolam (XANAX) 0.25 MG tablet Take 1  tablet (0.25 mg total) by mouth 3 (three) times daily as needed for anxiety. 11/13/14   [name], MD  clonazePAM (KLONOPIN) 0.5 MG tablet Take 0.5 mg by mouth 2 (two) times daily as needed for anxiety.     Historical Provider, MD  diphenhydramine-acetaminophen (TYLENOL PM) 25-500 MG TABS Take 1 tablet by mouth at bedtime as needed (sleep).     Historical Provider, MD  escitalopram (LEXAPRO) 10 MG tablet Take 1 tablet (10 mg total) by mouth daily. 11/13/14   [name], MD  gabapentin (NEURONTIN) 300 MG capsule Take 3 capsules (900 mg total) by mouth 2 (two) times daily. 09/15/14   Donita Brooks, NP  omeprazole (PRILOSEC OTC) 20 MG tablet Take 20 mg by mouth daily.    Historical Provider, MD  oxyCODONE (OXY IR/ROXICODONE) 5 MG immediate release tablet Take 1-2 tablets (5-10 mg total) by mouth every 4 (four) hours as needed for severe pain. 11/18/14   Erin R Barrett, PA-C  psyllium (METAMUCIL) 58.6 % powder Take 1 packet by mouth 3 (three) times daily.    Historical Provider,  MD  rivaroxaban (XARELTO) 20 MG TABS tablet Take 1 tablet (20 mg total) by mouth daily with supper. 11/18/14   Erin R Barrett, PA-C  tamsulosin (FLOMAX) 0.4 MG CAPS capsule Take 1 capsule (0.4 mg total) by mouth daily. 09/15/14   Donita Brooks, NP  traMADol (ULTRAM) 50 MG tablet Take 1-2 tablets (50-100 mg total) by mouth every 6 (six) hours as needed (mild pain). 11/18/14   Erin R Barrett, PA-C  vitamin B-12 (CYANOCOBALAMIN) 1000 MCG tablet Take 1,000 mcg by mouth daily.    Historical Provider, MD  vitamin E 400 UNIT capsule Take 400 Units by mouth daily.    Historical Provider, MD   Physical Exam: Filed Vitals:   11/23/14 1632 11/23/14 1633 11/23/14 1850  BP: 145/83  141/85  Pulse: 92  77  Temp: 98.6 F (37 C)  98 F (36.7 C)  TempSrc:   Oral  Resp: 18  18  Height:  '6\' 2"'$  (1.88 m)   Weight:  98.431 kg (217 lb)   SpO2: 96%  95%    Wt Readings from Last 3 Encounters:  11/23/14 98.431 kg (217 lb)  11/15/14  102.6 kg (226 lb 3.1 oz)  11/14/14 102.116 kg (225 lb 2 oz)    General:  Appears calm and comfortable Eyes:  PERRL, normal lids, irises & conjunctiva ENT:  grossly normal hearing, lips & tongue Neck:  no LAD, masses or thyromegaly Cardiovascular:  RRR, no m/r/g. Trace lower extremity bilateral edema. Telemetry:  SR, no arrhythmias  Respiratory:  CTA bilaterally, no w/r/r. Normal respiratory effort. Abdomen:  soft, ntnd Skin:  Right thoracotomy and chest tube wounds noted. Silk sutures in place. Mild surrounding erythema and induration without significant tenderness. No purulence or bloody discharge. Musculoskeletal:  grossly normal tone BUE/BLE Psychiatric:  grossly normal mood and affect, speech fluent and appropriate Neurologic:  grossly non-focal.          Labs on Admission:  Basic Metabolic Panel:  Recent Labs Lab 11/17/14 0430 11/23/14 1700  NA 135 141  K 3.8 3.8  CL 101 106  CO2 29 26  GLUCOSE 140* 124*  BUN 5* 15  CREATININE 0.67 0.89  CALCIUM 8.4* 9.1   Liver Function Tests:  Recent Labs Lab 11/17/14 0430 11/23/14 1700  AST 16 18  ALT 20 32  ALKPHOS 57 78  BILITOT 0.8 0.7  PROT 5.6* 6.7  ALBUMIN 2.9* 3.6   No results for input(s): LIPASE, AMYLASE in the last 168 hours. No results for input(s): AMMONIA in the last 168 hours. CBC:  Recent Labs Lab 11/17/14 0430 11/23/14 1700  WBC 7.0 7.9  HGB 13.0 14.2  HCT 38.4* 42.1  MCV 86.3 85.9  PLT 172 278   Cardiac Enzymes: No results for input(s): CKTOTAL, CKMB, CKMBINDEX, TROPONINI in the last 168 hours.  BNP (last 3 results) No results for input(s): BNP in the last 8760 hours.  ProBNP (last 3 results) No results for input(s): PROBNP in the last 8760 hours.  CBG: No results for input(s): GLUCAP in the last 168 hours.  Radiological Exams on Admission: No results found.  Assessment/Plan Active Problems:   GI bleed   Chronic anticoagulation   Constipation   Heme positive stool    Gastritis   S/P thoracotomy   History of rectal cancer  Charles [name] is a pleasant 64 year old male whom I was called to consult on for possibility of a GI bleed. Of note patient has had a GI bleed in the past. While  a GI bleed is still possibility I think it is mild. Patient's hemoglobin is 14.2 he's afebrile and vital signs are stable. Patient is currently on Xarelto and has been on blood thinners for quite some time. Given patient's description of bowel habits, types of stool, discontinuation of narcotics, and recent hospitalization I feel that his symptoms are more likely due to opioid withdrawal constipation, use of Pepto-Bismol, possible internal hemorrhoids, and possible gastroenteritis. Patient was given a dose of IV Protonix in the ED which helped significantly with any reflux etiology. Patient would also benefit from starting Senokot and increasing his home MiraLAX dosing. Patient to stop Pepto-Bismol to see if the nature of his stools change. Also ordered a dose of Zofran for the patient to see if this helps with his abdominal cramping. Patient has a close relationship with his primary care and GI physicians and will have a hemoglobin recheck on 11/24/2014. Patient has agreed to come back to the emergency room if he develops more frequent loose bowel movements and becomes more symptomatic. Patient aware of the risks associated with acute GI bleed. Patient's operative sites appeared to be fairly well-healing with only mild skin irritation but no definitive sign of infection. Recommending patient be discharged with mupirocin ointment and a sterile bandage. Of note silk sutures are to be removed on 11/24/2014 by surgical team.  Of note patient has a history of rectal cancer and is status post definitive excision in 2012 w/ follow up chemo w/ recent findings of chest mass.    Family Communication: Wife  Disposition Plan: DC home  MERRELL, DAVID Lenna Sciara, MD Family Medicine [facility] [email or web address] Password [email or web address]

## 2014-11-23 NOTE — Telephone Encounter (Signed)
We don't have any urgent OV until next Wednesday.  Suspect dark/black stools from Pepto. Is he still having problems having BM? Unclear if he is having multiple stools daily at this point by description provided.

## 2014-11-23 NOTE — Telephone Encounter (Signed)
Pt called- he had surgery last week for lung cancer. He got out of the hospital on Saturday. He did not have a bm from Tuesday until Saturday. When he got home he developed abd cramping and had to do a lot of straining, but finally had a bm. By Tuesday he noticed he was having some dark stools. Yesterday and today he said they are very dark, almost black. He has been feeling good, no SOB, no dizziness. He was started on Zarelto in the hospital and is still taking it. He also took some pepto yesterday and Tuesday. He only has the abd cramping with the bm. He is concerned because he just had surgery and is requesting any recommendations or an urgent ov.

## 2014-11-23 NOTE — ED Notes (Signed)
Applied Bacitracin and sterile bandage to surgical sites on right side of chest.

## 2014-11-24 ENCOUNTER — Ambulatory Visit (INDEPENDENT_AMBULATORY_CARE_PROVIDER_SITE_OTHER): Payer: Self-pay | Admitting: *Deleted

## 2014-11-24 DIAGNOSIS — C7801 Secondary malignant neoplasm of right lung: Secondary | ICD-10-CM

## 2014-11-24 DIAGNOSIS — Z9889 Other specified postprocedural states: Secondary | ICD-10-CM

## 2014-11-24 DIAGNOSIS — Z902 Acquired absence of lung [part of]: Secondary | ICD-10-CM

## 2014-11-24 DIAGNOSIS — C19 Malignant neoplasm of rectosigmoid junction: Secondary | ICD-10-CM

## 2014-11-24 NOTE — Telephone Encounter (Signed)
Tried to call patient. Left msg for return call. Only one phone number provided. Please offer him appt for next week, may use urgent.   Seen in ER that night. Evaluated for possible observation but felt to be stable for discharge.   He was heme + on stool specimen he brought to ER. Noted significant straining to have sample to show.   He was advised to stop Pepto. Increase miralax and add Senokot.   Lab Results  Component Value Date   WBC 7.9 11/23/2014   HGB 14.2 11/23/2014   HCT 42.1 11/23/2014   MCV 85.9 11/23/2014   PLT 278 11/23/2014   Lab Results  Component Value Date   CREATININE 0.89 11/23/2014   BUN 15 11/23/2014   NA 141 11/23/2014   K 3.8 11/23/2014   CL 106 11/23/2014   CO2 26 11/23/2014   Lab Results  Component Value Date   ALT 32 11/23/2014   AST 18 11/23/2014   ALKPHOS 78 11/23/2014   BILITOT 0.7 11/23/2014   Lab Results  Component Value Date   INR 1.17 11/23/2014   INR 1.12 11/14/2014   INR 1.06 10/24/2014

## 2014-11-24 NOTE — Progress Notes (Signed)
Charles [name] returns s/p partial right lobectomy for the removal of two sutures from previous chest tube sites.  These sites as well as the small thoracotomy incision are healing well.  On of the chest tube sutures opened slightly after the suture was removed, but only through the superficial fat. This was explained to he and his wife. We discussed his recent hemocult + stools and the follow up that is in progress after Xarelto was started. H+H are stable at present.  He will return as scheduled with a cxr.

## 2014-11-26 ENCOUNTER — Encounter (HOSPITAL_COMMUNITY): Payer: Self-pay | Admitting: *Deleted

## 2014-11-26 ENCOUNTER — Emergency Department (HOSPITAL_COMMUNITY)
Admission: EM | Admit: 2014-11-26 | Discharge: 2014-11-26 | Disposition: A | Discharge status: Home / Self Care | Payer: Medicare Other | Attending: Emergency Medicine | Admitting: Emergency Medicine

## 2014-11-26 DIAGNOSIS — F329 Major depressive disorder, single episode, unspecified: Secondary | ICD-10-CM | POA: Diagnosis not present

## 2014-11-26 DIAGNOSIS — I739 Peripheral vascular disease, unspecified: Secondary | ICD-10-CM | POA: Diagnosis not present

## 2014-11-26 DIAGNOSIS — I10 Essential (primary) hypertension: Secondary | ICD-10-CM | POA: Diagnosis not present

## 2014-11-26 DIAGNOSIS — K921 Melena: Secondary | ICD-10-CM | POA: Insufficient documentation

## 2014-11-26 DIAGNOSIS — Z86711 Personal history of pulmonary embolism: Secondary | ICD-10-CM | POA: Insufficient documentation

## 2014-11-26 DIAGNOSIS — Z7901 Long term (current) use of anticoagulants: Secondary | ICD-10-CM | POA: Insufficient documentation

## 2014-11-26 DIAGNOSIS — Z8701 Personal history of pneumonia (recurrent): Secondary | ICD-10-CM | POA: Insufficient documentation

## 2014-11-26 DIAGNOSIS — Z86718 Personal history of other venous thrombosis and embolism: Secondary | ICD-10-CM | POA: Diagnosis not present

## 2014-11-26 DIAGNOSIS — Z85118 Personal history of other malignant neoplasm of bronchus and lung: Secondary | ICD-10-CM | POA: Diagnosis not present

## 2014-11-26 DIAGNOSIS — Z79899 Other long term (current) drug therapy: Secondary | ICD-10-CM | POA: Diagnosis not present

## 2014-11-26 DIAGNOSIS — K219 Gastro-esophageal reflux disease without esophagitis: Secondary | ICD-10-CM | POA: Diagnosis not present

## 2014-11-26 DIAGNOSIS — Z85048 Personal history of other malignant neoplasm of rectum, rectosigmoid junction, and anus: Secondary | ICD-10-CM | POA: Diagnosis not present

## 2014-11-26 DIAGNOSIS — K625 Hemorrhage of anus and rectum: Secondary | ICD-10-CM | POA: Diagnosis present

## 2014-11-26 DIAGNOSIS — Z932 Ileostomy status: Secondary | ICD-10-CM | POA: Insufficient documentation

## 2014-11-26 HISTORY — DX: Long term (current) use of anticoagulants: Z79.01

## 2014-11-26 LAB — POC OCCULT BLOOD, ED: Fecal Occult Bld: NEGATIVE

## 2014-11-26 LAB — COMPREHENSIVE METABOLIC PANEL
ALT: 27 U/L (ref 17–63)
AST: 15 U/L (ref 15–41)
Albumin: 3.9 g/dL (ref 3.5–5.0)
Alkaline Phosphatase: 73 U/L (ref 38–126)
Anion gap: 8 (ref 5–15)
BUN: 12 mg/dL (ref 6–20)
CO2: 27 mmol/L (ref 22–32)
Calcium: 9.1 mg/dL (ref 8.9–10.3)
Chloride: 103 mmol/L (ref 101–111)
Creatinine, Ser: 0.87 mg/dL (ref 0.61–1.24)
GFR calc Af Amer: >60 mL/min (ref >60)
GFR calc non Af Amer: >60 mL/min (ref >60)
Glucose, Bld: 150 mg/dL — ABNORMAL HIGH (ref 65–99)
Potassium: 3.8 mmol/L (ref 3.5–5.1)
Sodium: 138 mmol/L (ref 135–145)
Total Bilirubin: 0.9 mg/dL (ref 0.3–1.2)
Total Protein: 7.2 g/dL (ref 6.5–8.1)

## 2014-11-26 LAB — CBC
HCT: 45.3 % (ref 39.0–52.0)
Hemoglobin: 15.4 g/dL (ref 13.0–17.0)
MCH: 29.3 pg (ref 26.0–34.0)
MCHC: 34 g/dL (ref 30.0–36.0)
MCV: 86.3 fL (ref 78.0–100.0)
Platelets: 315 10*3/uL (ref 150–400)
RBC: 5.25 MIL/uL (ref 4.22–5.81)
RDW: 12.7 % (ref 11.5–15.5)
WBC: 7.1 10*3/uL (ref 4.0–10.5)

## 2014-11-26 LAB — PROTIME-INR
INR: 1.51 — ABNORMAL HIGH (ref 0.00–1.49)
Prothrombin Time: 18.3 seconds — ABNORMAL HIGH (ref 11.6–15.2)

## 2014-11-26 NOTE — Discharge Instructions (Signed)
°Emergency Department Resource Guide °1) Find a Doctor and Pay Out of Pocket °Although you won't have to find out who is covered by your insurance plan, it is a good idea to ask around and get recommendations. You will then need to call the office and see if the doctor you have chosen will accept you as a new patient and what types of options they offer for patients who are self-pay. Some doctors offer discounts or will set up payment plans for their patients who do not have insurance, but you will need to ask so you aren't surprised when you get to your appointment. ° °2) Contact Your Local Health Department °Not all health departments have doctors that can see patients for sick visits, but many do, so it is worth a call to see if yours does. If you don't know where your local health department is, you can check in your phone book. The CDC also has a tool to help you locate your state's health department, and many state websites also have listings of all of their local health departments. ° °3) Find a Walk-in Clinic °If your illness is not likely to be very severe or complicated, you may want to try a walk in clinic. These are popping up all over the country in pharmacies, drugstores, and shopping centers. They're usually staffed by nurse practitioners or physician assistants that have been trained to treat common illnesses and complaints. They're usually fairly quick and inexpensive. However, if you have serious medical issues or chronic medical problems, these are probably not your best option. ° °No Primary Care Doctor: °- Call [facility] at  832-8000 - they can help you locate a primary care doctor that  accepts your insurance, provides certain services, etc. °- Physician Referral Service- 1-800-533-3463 ° °Chronic Pain Problems: °Organization         Address  Phone   Notes  °[facility]  (336) 297-2271 Patients need to be referred by their primary care doctor.  ° °Medication  Assistance: °Organization         Address  Phone   Notes  °[place] [facility] [address] °[place], [place] [address] (336) 641-8030 --Must be a resident of [place] °-- Must have NO insurance coverage whatsoever (no Medicaid/ Medicare, etc.) °-- The pt. MUST have a primary care doctor that directs their care regularly and follows them in the community °  °[facility]  (866) 331-1348   °[employer]  (888) 892-1162   ° °Agencies that provide inexpensive medical care: °Organization         Address  Phone   Notes  °[facility]  (336) 832-8035   °[facility]    (336) 832-7272   °[facility] [address] °[place], [place] [address] (336) 832-4777   °[facility] [address], °[place] (336) 271-4999   °[facility]    (336) 373-0678   °[facility]    (336) 272-1050   °[facility] ° [address], [place] Phone:  (336) 832-4444, Fax:  (336) 832-4440 Hours of Operation:  9 am - 6 pm, M-F.  Also accepts Medicaid/Medicare and self-pay.  °[facility] ° [address],  Phone: (336) [phone], Fax: (336) [phone]. Hours of Operation:  8:30 am - 5:30 pm, M-F.  Also accepts Medicaid and self-pay.  °[facility] [place] [address], [place] Phone: (336) 878-6027   °[facility] [address], [place], [place] (336)723-1848, Ext. 123 Mondays & Thursdays: 7-9 AM.  First 15 patients are seen on a first come, first serve basis. °  ° °Medicaid-accepting [place] Providers: ° °Organization         Address  Phone   Notes  °[facility] [address], [place] (336) 641-2100 Also accepts self-pay patients.  °[facility] [address], [place] ° (336) 856-9996   °[facility] [address], [place]  (336) 288-8857   °[facility] [address] [place] [address], [place] (336) 299-7000   °Veita Bland [address], [place]  ° (336) 373-1557 Only accepts [place] Access Medicaid patients after they have their name applied to their card.  ° °Self-Pay (no insurance) in [place]: ° °Organization         Address  Phone   Notes  °Sickle Cell Patients, [facility] [address], [place] (336) 832-1970   °[facility] [address], [place] (336) 832-4400   °[facility] ° [address], [place] (336) 992-4800   °[facility]/Dr. Osei-Bonsu ° [address] [place] [address], [place] or [address], [place] (336) [phone] Phone number for both [place] and [place] locations is the same.  °[facility] [address], [place] (336) 299-0000   °[facility] [address] [place] [address], [place] or [address] (336) 852-7530 °(336) 878-2260   °[facility] [address], [place] (336) 350-1642, phone; (336) 294-5005, fax Sees patients 1st and 3rd Saturday of every month.  Must not qualify for public or private insurance (i.e. Medicaid, Medicare, [place] Health Choice, Veterans' Benefits) • Household income should be no more than 200% of the poverty level •The clinic cannot treat you if you are pregnant or think you are pregnant • Sexually transmitted diseases are not treated at the clinic.  ° ° °Dental Care: °Organization         Address  Phone  Notes  °[place] [facility] [address], [place] (336) 641-6152 Accepts children up to age 21 who are enrolled in Medicaid or [place] Health Choice; pregnant women with a Medicaid card; and children who have applied for Medicaid or [place] Health Choice, but were declined, whose parents can pay a reduced fee at time of service.  °[place]  [facility] [place]  [address], [place] (336) 641-7733 Accepts children up to age 21 who are enrolled in Medicaid or [place] Health Choice; pregnant women with a Medicaid card; and children who have applied for Medicaid or [place] Health Choice, but were declined, whose parents can pay a reduced fee at time of service.  °[facility] ° [address],  (336) 641-4533 Patients are seen by appointment only. Walk-ins are not accepted. [facility] will see patients 18 years of age and older. °Monday - Tuesday (8am-5pm) °Most Wednesdays (8:30-5pm) °$30 per visit, cash only  °[facility] ° [address], [place] (336) 641-4533 Patients are seen by appointment only. Walk-ins are not accepted. [facility] will see patients 18 years of age and older. °One   Wednesday Evening (Monthly: Volunteer Based).  $30 per visit, cash only  °[facility]  (919) 537-3737 for adults; Children under age 4, call Graduate Pediatric Dentistry at ([phone]) [phone]. Children aged 4-14, please call ([phone]) [phone] to request a pediatric application. ° Dental services are provided in all areas of dental care including fillings, crowns and bridges, complete and partial dentures, implants, gum treatment, root canals, and extractions. Preventive care is also provided. Treatment is provided to both adults and children. °Patients are selected via a lottery and there is often a waiting list. °  °[facility] [address], °[place] ° (336) 763-8833 [email or web address] °  °[facility] [address], [place], [place] (336)723-1848, Ext. 123 Second and Fourth Thursday of each month, opens at 6:30 AM; Clinic ends at 9 AM.  Patients are seen on a first-come first-served basis, and a limited number are seen during each clinic.  ° °[facility] ° [address], [place], [place] (336) [phone]    Eligibility Requirements °You must have lived in [place], [place], or [place] counties for at least the last three months. °  You cannot be eligible for state or federal sponsored healthcare insurance, including Veterans Administration, Medicaid, or Medicare. °  You generally cannot be eligible for healthcare insurance through your employer.  °  How to apply: °Eligibility screenings are held every Tuesday and Wednesday afternoon from 1:00 pm until 4:00 pm. You do not need an appointment for the interview!  °[facility] [address], [place], [place] 336-631-2330   °[facility]  336-342-8273   °[facility]  336-703-3100   °[facility]  336-570-6415   ° °Behavioral Health Resources in the Community: °Intensive Outpatient Programs °Organization         Address  Phone  Notes  °[facility] [address], [place], [place] 336-878-6098   °[facility] [address], [place], [place] 336-832-9800   °[facility] [address], [place], [place] ° 336-882-2125   °[facility] [address],  °[place], [place] 1-800-853-5163 or 336-641-4981   °Substance Abuse Resources °Organization         Address  Phone  Notes  °[facility]  336-882-2125   °[facility]  336-784-9470   °[facility]  336-285-9073   °[facility]  336-845-3988   °Residential & Outpatient Substance Abuse Program  1-800-659-3381   °Psychological Services °Organization         Address  Phone  Notes  °[facility]  336- 832-9600   °[facility]  336- 378-7881   °[facility] [address], [place] 1-800-853-5163 or 336-641-4981   ° °Mobile Crisis Teams °Organization         Address  Phone  Notes  °[facility], Mobile Crisis Care Unit  1-877-626-1772   °Assertive °[facility] ° [address]  [place], [place] 336-834-9664   °Sharon DeEsch [address] °[place] 336-554-5454   ° °Self-Help/Support Groups °Organization         Address  Phone             Notes  °[facility] - variety of support groups  336- 373-1402 Call for more information  °Narcotics Anonymous (NA), [facility] [address], °[place]  2 meetings at this location  ° °  Residential Treatment Programs Organization         Address  Phone  Notes  [facility] [address],    [place]  1-228-410-5858   [facility]  [address] [place] [address], [place], [place]   [place], [place] 253-818-5908 Admissions: 8am-3pm M-F  [facility] [place] [address],    [place], [place] [phone]   [facility] [address] [place], [place], [place]   [facility] [address]  [place], [place]   [facility] Intensive Outpatient [address], [place], [place]   [facility] ([place] [place]  [place], [place] 1-669-521-0911 or (938)354-8206   [facility] [address] [place], [place] Accepts Medicaid  [facility] [address]  [place] 1-8253897730 Substance Abuse/Addiction Treatment   [facility] Organization         Address  Phone  Notes  [facility]  769-364-7166   Domenic Schwab, PhD [address] [place], [place]   [phone] or (980)047-3368   [facility] [place], [place] [phone]   [facility] [address], [place], [place] [phone] Insurance/Medicaid/sponsorship through [facility] and Families [address] [place]                                    [place], [place] [phone] [place] [address], [place] [phone]    Dr. Adele Schilder  ([phone]   [facility] Dept. 1) [address]  2) [place] 3)  [address], [place] [phone]) [phone]  [phone]   [place] [phone] or [phone] (After Hours)      Take your usual prescriptions as previously directed. Your red blood cell count continues to be normal today. Continue to not take Pepto Bismol. Call your regular GI doctor tomorrow to schedule a follow up appointment within the next 2 days.  Return to the Emergency Department immediately sooner if worsening.

## 2014-11-26 NOTE — ED Provider Notes (Signed)
CSN: [id number]     Arrival date & time 11/26/14  0919 History   First MD Initiated Contact with Patient 11/26/14 [phone]     Chief Complaint  Patient presents with  . Rectal Bleeding      HPI  Pt was seen at 0945. Per pt and his wife, c/o gradual onset and gradual improvement of constant "black stools" for the past 3 days. Pt is having 2 to 3 stools/day. Pt states his black stools started after he took [place] for constipation and strained after a firm BM. Pt was evaluated in the ED 3 days ago for same and d/c to f/u with GI MD. Pt states he stopped taking Pepto Bismol after that ED visit. Pt states his stools have changed from "all black" to "more brown colored and having black streaks." Pt states this morning he saw "small red flecks" of blood on his stool after his BM. Pt is currently taking xarelto. Denies abd pain, no back pain, no CP/SOB, no rectal pain, no fevers, no lightheadedness.    GI: Dr. [name] Past Medical History  Diagnosis Date  . HTN (hypertension)   . Pulmonary embolism   . GERD (gastroesophageal reflux disease)   . Depression 04/01/2011  . DVT (deep venous thrombosis) 05/09/2011  . 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 Colon cancer 07/24/10    rectal ca, inv adenocarcinoma  . Hx of radiation therapy 09/02/10 to 10/14/10    pelvis  . Rectal cancer 01/15/2011    S/P radiation and concurrent 5-FU continuous infusion from 09/09/10- 10/10/10.  S/P proctectomy with colorectal anastomosis and diverting loop ileostomy on 11/14/10 at [facility] by Dr. Harlon Ditty. Pathology reveals a pT3b N1 with 3/20 lymph nodes.    . Lung metastasis   . Peripheral vascular disease     dvt's,pe  . Pneumonia     hx x3  . Chronic anticoagulation    Past Surgical History  Procedure Laterality Date  . Esophagogastroduodenoscopy  07/2010    RMR: schatki ring s/p dilation, small hh, SB bx benign  . Colonoscopy  07/2010    proximal rectal apple core mass 10-14cm from anal verge (adenocarcinoma), 2-3cm  distal rectal carpet polyp s/p piecemeal snare polypectomy (adenoma)  . Eus  08/2010    Dr. Owens Loffler. uT3N0 circumferential, nearly obstruction rectosigmoid adenocarcinoma, distal edge 12cm from anal verge  . Ivc filter    . Colon surgery  11/14/2010    proctectomy with colorectal anastomosis and diverting loop ileostomy (temporary planned)  . Port a cath placement    . Colostomy reversal  april 2013  . Port-a-cath removal  09/24/2011    Procedure: REMOVAL PORT-A-CATH;  Surgeon: Donato Heinz, MD;  Location: [facility];  Service: General;  Laterality: N/A;  Minor Room  . Ivc filter    . Colonoscopy  04/21/2012    RMR: Friable,fibrotic appearing colorectal anastomosis producing some luminal narrowing-not felt to be critical. path: focal erosion with slight inflammation and hyperemia. SURVEILLANCE DUE DEC 2015  . Colonoscopy N/A 11/24/2013    Dr. Rourk:somewhat fibrotic/friable anastomotic mucosal-status post biopsy (narrowing not felt to be clinically significant) Single colonic diverticulum. benign polypoid rectal mucosa  . Hernia repair      abd hernia repair  . Video bronchoscopy N/A 11/15/2014    Procedure: VIDEO BRONCHOSCOPY;  Surgeon: [name], MD;  Location: [facility] OR;  Service: Thoracic;  Laterality: N/A;  . Video assisted thoracoscopy (vats)/wedge resection Right 11/15/2014    Procedure: VIDEO ASSISTED  THORACOSCOPY (VATS)/LUNG RESECTION WITH RIGHT LINGULECTOMY;  Surgeon: [name], MD;  [facility];  Service: Thoracic;  Laterality: Right;   Family History  Problem Relation Age of Onset  . Colon cancer Neg Hx   . Liver disease Neg Hx   . Inflammatory bowel disease Neg Hx   . Cancer Brother     throat  . Cancer Brother     prostate   History  Substance Use Topics  . Smoking status: Never Smoker   . Smokeless tobacco: Never Used  . Alcohol Use: No    Review of Systems ROS: Statement: All systems negative except as marked or noted in the HPI; Constitutional:  Negative for fever and chills. ; ; Eyes: Negative for eye pain, redness and discharge. ; ; ENMT: Negative for ear pain, hoarseness, nasal congestion, sinus pressure and sore throat. ; ; Cardiovascular: Negative for chest pain, palpitations, diaphoresis, dyspnea and peripheral edema. ; ; Respiratory: Negative for cough, wheezing and stridor. ; ; Gastrointestinal: +"black stools," "red flecks on stool." Negative for nausea, vomiting, diarrhea, abdominal pain, hematemesis, jaundice. . ; ; Genitourinary: Negative for dysuria, flank pain and hematuria. ; ; Musculoskeletal: Negative for back pain and neck pain. Negative for swelling and trauma.; ; Skin: Negative for pruritus, rash, abrasions, blisters, bruising and skin lesion.; ; Neuro: Negative for headache, lightheadedness and neck stiffness. Negative for weakness, altered level of consciousness , altered mental status, extremity weakness, paresthesias, involuntary movement, seizure and syncope.      Allergies  Oxycodone; Tramadol; and Trazodone and nefazodone  Home Medications   Prior to Admission medications   Medication Sig Start Date End Date Taking? Authorizing Provider  ALPRAZolam (XANAX) 0.25 MG tablet Take 1 tablet (0.25 mg total) by mouth 3 (three) times daily as needed for anxiety. 11/13/14  Yes [name], MD  clonazePAM (KLONOPIN) 0.5 MG tablet Take 0.5 mg by mouth at bedtime.    Yes Historical Provider, MD  diphenhydramine-acetaminophen (TYLENOL PM) 25-500 MG TABS Take 1 tablet by mouth at bedtime as needed (sleep).    Yes Historical Provider, MD  escitalopram (LEXAPRO) 10 MG tablet Take 1 tablet (10 mg total) by mouth daily. 11/13/14  Yes [name], MD  famotidine (PEPCID) 20 MG tablet Take 1 tablet (20 mg total) by mouth 2 (two) times daily. 11/23/14  Yes Noemi Chapel, MD  gabapentin (NEURONTIN) 300 MG capsule Take 3 capsules (900 mg total) by mouth 2 (two) times daily. 09/15/14  Yes Donita Brooks, NP  omeprazole (PRILOSEC  OTC) 20 MG tablet Take 20 mg by mouth daily.   Yes Historical Provider, MD  psyllium (METAMUCIL) 58.6 % powder Take 1 packet by mouth 2 (two) times daily.    Yes Historical Provider, MD  rivaroxaban (XARELTO) 20 MG TABS tablet Take 1 tablet (20 mg total) by mouth daily with supper. 11/18/14  Yes Erin R Barrett, PA-C  tamsulosin (FLOMAX) 0.4 MG CAPS capsule Take 1 capsule (0.4 mg total) by mouth daily. 09/15/14  Yes Donita Brooks, NP  vitamin B-12 (CYANOCOBALAMIN) 1000 MCG tablet Take 1,000 mcg by mouth daily.   Yes Historical Provider, MD  vitamin E 400 UNIT capsule Take 400 Units by mouth daily.   Yes Historical Provider, MD  oxyCODONE (OXY IR/ROXICODONE) 5 MG immediate release tablet Take 1-2 tablets (5-10 mg total) by mouth every 4 (four) hours as needed for severe pain. Patient not taking: Reported on 11/23/2014 11/18/14   Erin R Barrett, PA-C   BP  181/90 mmHg  Pulse 69  Temp(Src) 97.5 F (36.4 C) (Oral)  Resp 22  Ht '6\' 2"'$  (1.88 m)  Wt 217 lb (98.431 kg)  BMI 27.85 kg/m2  SpO2 98%   10:13 Orthostatic Vital Signs JM  Orthostatic Lying  - BP- Lying: 139/76 mmHg ; Pulse- Lying: 70  Orthostatic Sitting - BP- Sitting: 137/85 mmHg ; Pulse- Sitting: 76  Orthostatic Standing at 0 minutes - BP- Standing at 0 minutes: 137/77 mmHg ; Pulse- Standing at 0 minutes: 83      Physical Exam  0950: Physical examination:  Nursing notes reviewed; Vital signs and O2 SAT reviewed;  Constitutional: Well developed, Well nourished, Well hydrated, In no acute distress; Head:  Normocephalic, atraumatic; Eyes: EOMI, PERRL, No scleral icterus; ENMT: Mouth and pharynx normal, Mucous membranes moist; Neck: Supple, Full range of motion, No lymphadenopathy; Cardiovascular: Regular rate and rhythm, No gallop; Respiratory: Breath sounds clear & equal bilaterally, No wheezes.  Speaking full sentences with ease, Normal respiratory effort/excursion; Chest: Nontender, Movement normal; Abdomen: Soft, Nontender, Nondistended,  Normal bowel sounds Rectal exam performed w/permission of pt and ED RN chaperone present.  Anal tone normal.  Non-tender, scant amount of brown stool in rectal vault, heme neg.  No fissures, no external hemorrhoids, no palp masses.; Genitourinary: No CVA tenderness; Extremities: Pulses normal, No tenderness, No edema, No calf edema or asymmetry.; Neuro: AA&Ox3, Major CN grossly intact.  Speech clear. No gross focal motor or sensory deficits in extremities.; Skin: Color normal, Warm, Dry.   ED Course  Procedures     EKG Interpretation None      MDM  MDM Reviewed: previous chart, nursing note and vitals Reviewed previous: labs Interpretation: labs      Results for orders placed or performed during the hospital encounter of 11/26/14  Comprehensive metabolic panel  Result Value Ref Range   Sodium 138 135 - 145 mmol/L   Potassium 3.8 3.5 - 5.1 mmol/L   Chloride 103 101 - 111 mmol/L   CO2 27 22 - 32 mmol/L   Glucose, Bld 150 (H) 65 - 99 mg/dL   BUN 12 6 - 20 mg/dL   Creatinine, Ser 0.87 0.61 - 1.24 mg/dL   Calcium 9.1 8.9 - 10.3 mg/dL   Total Protein 7.2 6.5 - 8.1 g/dL   Albumin 3.9 3.5 - 5.0 g/dL   AST 15 15 - 41 U/L   ALT 27 17 - 63 U/L   Alkaline Phosphatase 73 38 - 126 U/L   Total Bilirubin 0.9 0.3 - 1.2 mg/dL   GFR calc non Af Amer >60 >60 mL/min   GFR calc Af Amer >60 >60 mL/min   Anion gap 8 5 - 15  CBC  Result Value Ref Range   WBC 7.1 4.0 - 10.5 K/uL   RBC 5.25 4.22 - 5.81 MIL/uL   Hemoglobin 15.4 13.0 - 17.0 g/dL   HCT 45.3 39.0 - 52.0 %   MCV 86.3 78.0 - 100.0 fL   MCH 29.3 26.0 - 34.0 pg   MCHC 34.0 30.0 - 36.0 g/dL   RDW 12.7 11.5 - 15.5 %   Platelets 315 150 - 400 K/uL  Protime-INR  Result Value Ref Range   Prothrombin Time 18.3 (H) 11.6 - 15.2 seconds   INR 1.51 (H) 0.00 - 1.49  POC occult blood, ED  Result Value Ref Range   Fecal Occult Bld NEGATIVE NEGATIVE    1055:  H/H stable. Pt is not orthostatic on VS. Stool heme negative today.  Abd remains  benign. T/C to GI Dr. Laural Golden, case discussed, including:  HPI, pertinent PM/SHx, VS/PE, dx testing, ED course and treatment:  No clear indication for admission today with stable H/H and VS, pepto bismol will take several days to get out of system, d/c pt to f/u with GI MD this week, he will contact Dr. Roseanne Kaufman office in the morning regarding pt. Dx and testing, as well as d/w GI MD, d/w pt and family.  Questions answered.  Verb understanding, agreeable to d/c home with outpt f/u.     Francine Graven, DO 11/28/14 1658

## 2014-11-26 NOTE — ED Notes (Signed)
Pt c/o dark black stool since last Thursday. Small specks of bright red blood in stool. Pt reports he came in last Thursday for same thing. Pt says this hasn't gotten any better since then.

## 2014-11-27 NOTE — Telephone Encounter (Signed)
APPOINTMENT MADE AND PATIENT AWARE OF DATE AND TIME  °

## 2014-11-27 NOTE — Telephone Encounter (Signed)
Noted  

## 2014-11-28 ENCOUNTER — Encounter (HOSPITAL_COMMUNITY): Payer: Self-pay | Admitting: Hematology & Oncology

## 2014-11-28 ENCOUNTER — Encounter (HOSPITAL_BASED_OUTPATIENT_CLINIC_OR_DEPARTMENT_OTHER): Payer: Medicare Other | Admitting: Hematology & Oncology

## 2014-11-28 VITALS — BP 138/76 | HR 66 | Temp 98.5°F | Resp 18 | Wt 219.9 lb

## 2014-11-28 DIAGNOSIS — I82409 Acute embolism and thrombosis of unspecified deep veins of unspecified lower extremity: Secondary | ICD-10-CM | POA: Diagnosis not present

## 2014-11-28 DIAGNOSIS — I2699 Other pulmonary embolism without acute cor pulmonale: Secondary | ICD-10-CM

## 2014-11-28 DIAGNOSIS — C20 Malignant neoplasm of rectum: Secondary | ICD-10-CM | POA: Diagnosis not present

## 2014-11-28 DIAGNOSIS — C7800 Secondary malignant neoplasm of unspecified lung: Secondary | ICD-10-CM

## 2014-11-28 DIAGNOSIS — R911 Solitary pulmonary nodule: Secondary | ICD-10-CM

## 2014-11-28 DIAGNOSIS — F418 Other specified anxiety disorders: Secondary | ICD-10-CM

## 2014-11-28 NOTE — Patient Instructions (Signed)
..  [place] at [facility] Discharge Instructions  RECOMMENDATIONS MADE BY THE CONSULTANT AND ANY TEST RESULTS WILL BE SENT TO YOUR REFERRING PHYSICIAN. Reviewed national guidelines to discuss the patients plan. CT scan in 3 months. Advised if he feels out of the ordinary at anytime, he can be seen before this time.  another chest X-Ray in August  Return in 3 months  Thank you for choosing [place] at [facility] to provide your oncology and hematology care.  To afford each patient quality time with our provider, please arrive at least 15 minutes before your scheduled appointment time.    You need to re-schedule your appointment should you arrive 10 or more minutes late.  We strive to give you quality time with our providers, and arriving late affects you and other patients whose appointments are after yours.  Also, if you no show three or more times for appointments you may be dismissed from the clinic at the providers discretion.     Again, thank you for choosing [facility].  Our hope is that these requests will decrease the amount of time that you wait before being seen by our physicians.       _____________________________________________________________  Should you have questions after your visit to [facility], please contact our office at [phone] between the hours of 8:30 a.m. and 4:30 p.m.  Voicemails left after 4:30 p.m. will not be returned until the following business day.  For prescription refill requests, have your pharmacy contact our office.

## 2014-11-28 NOTE — Progress Notes (Signed)
Charles [name]., MD [place] [address]  History of Stage III cancer of the rectum status post preoperative radiation and chemotherapy followed by definitive surgery. He then underwent adjuvant chemotherapy with FOLFOX. During the radiation therapy and continuous infusion 5-FU from 09/09/2010 through 10/10/2010. FOLFOX was administered from October 2012 through 07/14/2011  Stage IV CRC with solitary pulmonary metastases s/p surgical resection with Dr.Gerhardt  CURRENT THERAPY: Surveillance per NCCN guidelines  INTERVAL HISTORY: Charles [name] 64 y.o. male returns for followup of stage IV CRC. He has done exceptionally well since his surgery. He has no complaints today. He went to the ED secondary to dark stool. He has been referred to Dr. Sydell [name]. He continues on Xarelto.  He is here today with his wife.  He denies any SOB or pain. He is sleeping and eating well. His anxiety is improved.    Rectal cancer   07/24/2010 Initial Diagnosis Invasive adenocarcinoma of rectum   09/09/2010 Concurrent Chemotherapy S/P radiation and concurrent 5-FU continuous infusion from 09/09/10- 10/10/10.   11/14/2010 Surgery S/P proctectomy with colorectal anastomosis and diverting loop ileostomy on 11/14/10 at [facility] by Dr. Harlon [name]. Pathology reveals a pT3b N1 with 3/20 lymph nodes.   02/05/2011 - 07/14/2011 Chemotherapy FOLFOX   08/18/2011 Surgery Approximate date of surgery- [place] by Dr. Harlon [name]    Remission    11/24/2013 Survivorship Colonoscopy- somewhat fibrotic/friable anastomotic mucosal-status post biopsy (narrowing not felt to be clinically significant).  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 The right iliac venous system is patent and do   09/25/2014  PET scan The right middle lobe pulmonary nodule is hypermetabolic, favored to represent a primary bronchogenic carcinoma.Equivocal mediastinal nodes, similar to surrounding blood pool. Bilateral adrenal hypermetabolism, felt to be physiologic   10/24/2014 Pathology Results Lung, needle/core biopsy(ies), RML - ADENOCARCINOMA, SEE COMMENT metastatic adenocarcinoma of a colorectal primary     Past Medical History  Diagnosis Date  . HTN (hypertension)   . Pulmonary embolism   . GERD (gastroesophageal reflux disease)   . Depression 04/01/2011  . DVT (deep venous thrombosis) 05/09/2011  . 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 Colon cancer 07/24/10    rectal ca, inv adenocarcinoma  . Hx of radiation therapy 09/02/10 to 10/14/10    pelvis  . Rectal cancer 01/15/2011    S/P radiation and concurrent 5-FU continuous infusion from 09/09/10- 10/10/10.  S/P proctectomy with colorectal anastomosis and diverting loop ileostomy on 11/14/10 at [facility] by Dr. Harlon [name]. Pathology reveals a pT3b N1 with 3/20 lymph nodes.    . Lung metastasis   . Peripheral vascular disease     dvt's,pe  . Pneumonia     hx x3  . Chronic anticoagulation     has GERD; Rectal cancer; Hx of radiation therapy; Stiffness of joint, not elsewhere classified, ankle and foot; Weakness of both legs; Peripheral neuropathy due to oxaliplatin-chemotherapy; Diarrhea; Acute pulmonary embolism; Lactic acidosis; Lower abdominal pain; Urinary retention; Lung nodule, solitary; DVT (deep venous thrombosis); Lung nodule; S/P partial lobectomy of lung; GI bleed; Chronic anticoagulation; Constipation; Heme positive stool; Gastritis; S/P thoracotomy; and History of rectal cancer on his problem list.     is allergic to oxycodone; tramadol; and trazodone and nefazodone.  [name] had no  medications administered during this visit.  Past Surgical History  Procedure Laterality Date  . Esophagogastroduodenoscopy  07/2010    RMR: schatki ring s/p  dilation, small hh, SB bx benign  . Colonoscopy  07/2010    proximal rectal apple core mass 10-14cm from anal verge (adenocarcinoma), 2-3cm distal rectal carpet polyp s/p piecemeal snare polypectomy (adenoma)  . Eus  08/2010    Dr. Owens [name]. uT3N0 circumferential, nearly obstruction rectosigmoid adenocarcinoma, distal edge 12cm from anal verge  . Ivc filter    . Colon surgery  11/14/2010    proctectomy with colorectal anastomosis and diverting loop ileostomy (temporary planned)  . Port a cath placement    . Colostomy reversal  april 2013  . Port-a-cath removal  09/24/2011    Procedure: REMOVAL PORT-A-CATH;  Surgeon: Charles Heinz, MD;  Location: [facility];  Service: General;  Laterality: N/A;  Minor Room  . Ivc filter    . Colonoscopy  04/21/2012    RMR: Friable,fibrotic appearing colorectal anastomosis producing some luminal narrowing-not felt to be critical. path: focal erosion with slight inflammation and hyperemia. SURVEILLANCE DUE DEC 2015  . Colonoscopy N/A 11/24/2013    Dr. Rourk:somewhat fibrotic/friable anastomotic mucosal-status post biopsy (narrowing not felt to be clinically significant) Single colonic diverticulum. benign polypoid rectal mucosa  . Hernia repair      abd hernia repair  . Video bronchoscopy N/A 11/15/2014    Procedure: VIDEO BRONCHOSCOPY;  Surgeon: Charles Isaac, MD;  Location: [facility] OR;  Service: Thoracic;  Laterality: N/A;  . Video assisted thoracoscopy (vats)/wedge resection Right 11/15/2014    Procedure: VIDEO ASSISTED THORACOSCOPY (VATS)/LUNG RESECTION WITH RIGHT LINGULECTOMY;  Surgeon: Charles Isaac, MD;  Location: [place];  Service: Thoracic;  Laterality: Right;    Denies any headaches, dizziness, double vision, fevers, chills, night sweats, nausea, vomiting, diarrhea, constipation, chest pain, heart palpitations, shortness of breath, blood in stool, black tarry stool, urinary pain, urinary burning, urinary frequency, hematuria. 14 point review of systems  was performed and is negative except as detailed under history of present illness and above    PHYSICAL EXAMINATION  ECOG PERFORMANCE STATUS: 0 - Asymptomatic  Filed Vitals:   11/28/14 1043  BP: 138/76  Pulse: 66  Temp: 98.5 F (36.9 C)  Resp: 18    GENERAL:alert, no distress, well nourished, well developed, comfortable, cooperative, obese and smiling, unaccompanied  SKIN: skin color, texture, turgor are normal, no rashes or significant lesions HEAD: Normocephalic, No masses, lesions, tenderness or abnormalities EYES: normal, PERRLA, EOMI, Conjunctiva are pink and non-injected EARS: External ears normal OROPHARYNX:lips, buccal mucosa, and tongue normal and mucous membranes are moist  NECK: supple, no adenopathy, thyroid normal size, non-tender, without nodularity, no stridor, non-tender, trachea midline LYMPH:  no palpable lymphadenopathy, no hepatosplenomegaly BREAST:not examined LUNGS: clear to auscultation and percussion. Well healing surgical incision sites HEART: regular rate & rhythm, no murmurs, no gallops, S1 normal and S2 normal ABDOMEN:abdomen soft, non-tender, obese, normal bowel sounds, no masses or organomegaly and no hepatosplenomegaly BACK: Back symmetric, no curvature., No CVA tenderness EXTREMITIES:less then 2 second capillary refill, no joint deformities, effusion, or inflammation, no edema, no skin discoloration, no clubbing, no cyanosis  NEURO: alert & oriented x 3 with fluent speech, no focal motor/sensory deficits, gait normal   LABORATORY DATA: Results for ALDRIDGE, KRZYZANOWSKI (MRN [id number]) as of 11/28/2014 17:09  Ref. Range 11/26/2014 09:31  Sodium Latest Ref Range: 135-145 mmol/L 138  Potassium Latest Ref Range: 3.5-5.1 mmol/L 3.8  Chloride Latest  Ref Range: 101-111 mmol/L 103  CO2 Latest Ref Range: 22-32 mmol/L 27  BUN Latest Ref Range: 6-20 mg/dL 12  Creatinine Latest Ref Range: 0.61-1.24 mg/dL 0.87  Calcium Latest Ref Range: 8.9-10.3 mg/dL 9.1    EGFR (Non-African Amer.) Latest Ref Range: >60 mL/min >60  EGFR (African American) Latest Ref Range: >60 mL/min >60  Glucose Latest Ref Range: 65-99 mg/dL 150 (H)  Anion gap Latest Ref Range: 5-15  8  Alkaline Phosphatase Latest Ref Range: 38-126 U/L 73  Albumin Latest Ref Range: 3.5-5.0 g/dL 3.9  AST Latest Ref Range: 15-41 U/L 15  ALT Latest Ref Range: 17-63 U/L 27  Total Protein Latest Ref Range: 6.5-8.1 g/dL 7.2  Total Bilirubin Latest Ref Range: 0.3-1.2 mg/dL 0.9  WBC Latest Ref Range: 4.0-10.5 K/uL 7.1  RBC Latest Ref Range: 4.22-5.81 MIL/uL 5.25  Hemoglobin Latest Ref Range: 13.0-17.0 g/dL 15.4  HCT Latest Ref Range: 39.0-52.0 % 45.3  MCV Latest Ref Range: 78.0-100.0 fL 86.3  MCH Latest Ref Range: 26.0-34.0 pg 29.3  MCHC Latest Ref Range: 30.0-36.0 g/dL 34.0  RDW Latest Ref Range: 11.5-15.5 % 12.7  Platelets Latest Ref Range: 150-400 K/uL 315  Prothrombin Time Latest Ref Range: 11.6-15.2 seconds 18.3 (H)  INR Latest Ref Range: 0.00-1.49  1.51 (H)    ASSESSMENT AND PLAN:  Stage IV CRC s/p surgical resection of solitary pulmonary nodule on 11/16/2014 Bronchoscopy, R VATS, wedge resection RML 11/16/2014 with Dr. Servando Snare History of Stage III CRC Treatment related neuropathy History of IVC filter in 2012 Acute DVT/PE 09/2014, s/p catheter lysis of DVT XARELTO Anxiety on Lexapro  We reviewed his pathology. He has done well with his surgery. We reviewed the NCCN guidelines in detail. I advised him that in the setting of prior oxaliplatin based chemotherapy, observation is not unreasonable. We could also consider proceeding with FOLFIRI/AVASTIN.  We discussed the possibility/probability that this can recur again. He and his wife are both aware. We also discussed that surgical resection can be curative, but data in resection of extrahepatic metastases is limited.  There is evidence pointing to a higher likelihood of recurrence in resected extrahepatic disease.  After our discussion  the patient and his wife would like to proceed with close observation. We will therefore schedule him for a CT of the C/A/P in 3 months with a return office visit and physical exam at that time. The patient had a PET/CT on 09/25/2014 and his CRC is PET avid. If CT reveals any suspicion of additional disease we will proceed with PET.  He is to continue on his lexapro and Xarelto.  All questions were answered. The patient knows to call the clinic with any problems, questions or concerns. We can certainly see the patient much sooner if necessary.   This document serves as a record of services personally performed by Ancil Linsey, MD. It was created on her behalf by Janace Hoard, a trained medical scribe. The creation of this record is based on the scribe's personal observations and the provider's statements to them. This document has been checked and approved by the attending provider.  I have reviewed the above documentation for accuracy and completeness, and I agree with the above.  This note was electronically signed.   Kelby Fam. Whitney Muse, MD

## 2014-11-30 ENCOUNTER — Encounter: Payer: Self-pay | Admitting: Gastroenterology

## 2014-11-30 ENCOUNTER — Ambulatory Visit (INDEPENDENT_AMBULATORY_CARE_PROVIDER_SITE_OTHER): Payer: Medicare Other | Admitting: Gastroenterology

## 2014-11-30 VITALS — BP 131/82 | HR 90 | Temp 98.1°F | Ht 74.0 in | Wt 220.6 lb

## 2014-11-30 DIAGNOSIS — R195 Other fecal abnormalities: Secondary | ICD-10-CM

## 2014-11-30 NOTE — Patient Instructions (Signed)
1. I will discuss recent concerns with Dr. [name] once he is back in town. I don't anticipate need for any endoscopic evaluations but we will get his input.  2. Call if you have questions or concerns.  3. Products that contain bismuth subsalicylate can turn your stool black.

## 2014-11-30 NOTE — Progress Notes (Signed)
Primary Care Physician: [name], MD  Primary Gastroenterologist:  Garfield Cornea, MD   Chief Complaint  Patient presents with  . Blood In Stools    HPI: Charles [name] is a 64 y.o. male here for follow-up of recent black stools, heme positive stool. Last seen in the office in March 2016 for follow-up of diarrhea/fecal incontinence. Patient has a history of stage III rectal adenocarcinoma status post preoperative radiation and chemotherapy followed by definitive surgery, postoperative chemotherapy a few years back. Last colonoscopy in July 2015. Somewhat from fibrotic/friable anastomotic mucosa-status post biopsy (nearing not felt to be clinically significant). Single colonic diverticulum. Benign polypoid rectal mucosa.  Back in May 2016 he developed acute bilateral DVT/PE and what appear to be clotted IVC filter that was previously placed. Required thrombolysis. CT chest also had revealed 2 cm right middle lobe nodule which turned out to be metastasis from his colon cancer.  Underwent wedge resection of right middle lobe on 11/15/2014. Constipation after surgery. Also was having some upset stomach for which pharmacist recommended Pepto-Bismol. Patient states he took for 2 days and by the third day his stools were black. He was unaware that Pepto could turn stools black. Because he was recently started on Xarelto he was concerned about possibility of GI bleed. He was evaluated in the emergency department and took a sample of stool. It was Hemoccult-positive although the patient states he had to strain quite a bit have a bowel movement. Not unusual for patient to have streaks of blood on the toilet tissue with history of perianal irritation which he has had off and on. In the ER his hemoglobin was 14.2. Patient was asked to stop hepatitis C for stool color returned to normal. 2 days later presented back to [facility] because of persistent black stool, repeat heme occult was  negative. Hemoglobin was over 15 at this time.  He had 3 bowel movements yesterday that are normal in color, yellowish-brown. Stools are back at baseline, finally got over postop constipation. Back on his Metamucil as well. Yesterday felt a little bit dizzy but today's been feeling really good. No abdominal pain. Gained back 3 of 8 pounds. Nausea gone. Not on oxycodone or tramadol because developed blisters.   Stop pain medications. Hallucinations. Oxycodone/tramadol blisters.     Current Outpatient Prescriptions  Medication Sig Dispense Refill  . ALPRAZolam (XANAX) 0.25 MG tablet Take 1 tablet (0.25 mg total) by mouth 3 (three) times daily as needed for anxiety. 45 tablet 0  . clonazePAM (KLONOPIN) 0.5 MG tablet Take 0.5 mg by mouth at bedtime.     . diphenhydramine-acetaminophen (TYLENOL PM) 25-500 MG TABS Take 1 tablet by mouth at bedtime as needed (sleep).     Marland Kitchen escitalopram (LEXAPRO) 10 MG tablet Take 1 tablet (10 mg total) by mouth daily. 30 tablet 3  . famotidine (PEPCID) 20 MG tablet Take 1 tablet (20 mg total) by mouth 2 (two) times daily. 30 tablet 0  . gabapentin (NEURONTIN) 300 MG capsule Take 3 capsules (900 mg total) by mouth 2 (two) times daily. 180 capsule 0  . omeprazole (PRILOSEC OTC) 20 MG tablet Take 20 mg by mouth daily.    . psyllium (METAMUCIL) 58.6 % powder Take 1 packet by mouth 2 (two) times daily.     . rivaroxaban (XARELTO) 20 MG TABS tablet Take 1 tablet (20 mg total) by mouth daily with supper. 30 tablet 3  . tamsulosin (FLOMAX) 0.4 MG CAPS capsule Take  1 capsule (0.4 mg total) by mouth daily. 30 capsule 3  . vitamin B-12 (CYANOCOBALAMIN) 1000 MCG tablet Take 1,000 mcg by mouth daily.    . vitamin E 400 UNIT capsule Take 400 Units by mouth daily.     No current facility-administered medications for this visit.   Facility-Administered Medications Ordered in Other Visits  Medication Dose Route Frequency Provider Last Rate Last Dose  . sodium chloride 0.9 %  injection 10 mL  10 mL Intravenous PRN Everardo All, MD   10 mL at 08/22/11 1114    Allergies as of 11/30/2014 - Review Complete 11/30/2014  Allergen Reaction Noted  . Oxycodone  11/23/2014  . Tramadol  11/23/2014  . Trazodone and nefazodone Other (See Comments) 09/08/2014    ROS:  General: Negative for anorexia, weight loss, fever, chills, fatigue, weakness. ENT: Negative for hoarseness, difficulty swallowing , nasal congestion. CV: Negative for chest pain, angina, palpitations, dyspnea on exertion, peripheral edema.  Respiratory: Negative for dyspnea at rest, dyspnea on exertion, cough, sputum, wheezing.  GI: See history of present illness. GU:  Negative for dysuria, hematuria, urinary incontinence, urinary frequency, nocturnal urination.  Endo: Negative for unusual weight change.    Physical Examination:   BP 131/82 mmHg  Pulse 90  Temp(Src) 98.1 F (36.7 C) (Oral)  Ht '6\' 2"'$  (1.88 m)  Wt 220 lb 9.6 oz (100.064 kg)  BMI 28.31 kg/m2  General: Well-nourished, well-developed in no acute distress.  Eyes: No icterus. Mouth: Oropharyngeal mucosa moist and pink , no lesions erythema or exudate. Lungs: Clear to auscultation bilaterally.  Heart: Regular rate and rhythm, no murmurs rubs or gallops.  Abdomen: Bowel sounds are normal, nontender, nondistended, no hepatosplenomegaly or masses, no abdominal bruits or hernia , no rebound or guarding.   Extremities: No lower extremity edema. No clubbing or deformities. Neuro: Alert and oriented x 4   Skin: Warm and dry, no jaundice.   Psych: Alert and cooperative, normal mood and affect.  Labs:  Lab Results  Component Value Date   WBC 7.1 11/26/2014   HGB 15.4 11/26/2014   HCT 45.3 11/26/2014   MCV 86.3 11/26/2014   PLT 315 11/26/2014   Lab Results  Component Value Date   CREATININE 0.87 11/26/2014   BUN 12 11/26/2014   NA 138 11/26/2014   K 3.8 11/26/2014   CL 103 11/26/2014   CO2 27 11/26/2014   Lab Results  Component  Value Date   ALT 27 11/26/2014   AST 15 11/26/2014   ALKPHOS 73 11/26/2014   BILITOT 0.9 11/26/2014    Imaging Studies: Dg Chest 2 View  11/18/2014   CLINICAL DATA:  Lung resection with right lingulectomy. History of lung cancer.  EXAM: CHEST  2 VIEW  COMPARISON:  11/17/2014  FINDINGS: Right IJ central line tip to level of the superior vena cava. Right apical pneumothorax persists, measuring less than 5% of lung volume. There is persistent opacity at the right lung base. Right chest tube has been removed. Left lung is essentially clear and shows interval improvement in aeration. No pulmonary edema.  IMPRESSION: 1. Removal of right chest tube. 2. Right basilar opacity consistent with atelectasis. 3. Small right apical pneumothorax, stable.   Electronically Signed   By: Nolon Nations M.D.   On: 11/18/2014 08:04   Dg Chest 2 View  11/14/2014   CLINICAL DATA:  Preoperative examination prior to bronchoscopy, history of hypertension, pulmonary nodule, pulmonary embolism, rectal malignancy.  EXAM: CHEST  2 VIEW  COMPARISON:  Chest x-ray of October 24, 2014 and CT scan of the chest of November 13, 2014.  FINDINGS: The lungs are adequately inflated. A pulmonary nodule previously demonstrated in the right middle lobe anteriorly is smaller and less well defined on today's study. The heart is normal in size. The pulmonary vascularity is not engorged. The trachea is midline. There is no pleural effusion. There is multilevel mild degenerative disc disease of the thoracic spine as well as calcification of portions of the anterior longitudinal ligament.  IMPRESSION: There is no pneumonia nor CHF. A known right middle lobe pulmonary nodule is less conspicuous on today's study.   Electronically Signed   By: David  Martinique M.D.   On: 11/14/2014 15:51   Ct Chest Wo Contrast  11/13/2014   CLINICAL DATA:  History colon cancer in 2012. Colostomy and reversal. Chemotherapy for 9 months. Lung nodule. Biopsy demonstrating colon  cancer metastasis.  EXAM: CT CHEST WITHOUT CONTRAST  TECHNIQUE: Multidetector CT imaging of the chest was performed following the standard protocol without IV contrast.  COMPARISON:  Biopsy of 10/28/2014. PET of 09/25/2014. Chest CT 09/08/2014  FINDINGS: Mediastinum/Nodes: No supraclavicular adenopathy. Heart size upper normal. Coronary artery atherosclerosis, including within the LAD and left circumflex. Partial anomalous pulmonary venous return to the left upper lobe.  9 mm subcarinal node is unchanged and not pathologic by size criteria. The previously described right hilar node lymph node is not well evaluated on today's noncontrast study.  Lungs/Pleura: No pleural fluid. Right middle lobe pulmonary nodule measures 1.5 x 1.6 cm on image 40 versus 1.9 x 1.8 cm on 09/08/2014. On sagittal reformatted image 39, measures 1.6 cm craniocaudal and contacts the right minor fissure. 2.0 cm craniocaudal on the prior.  No new nodules identified.  Upper abdomen: Mild hepatic steatosis. Normal imaged portions of the spleen, stomach, pancreas, gallbladder, biliary tract, gallbladder. Left adrenal calcification is not significantly changed. Normal rib right adrenal gland and imaged portions of the kidneys.  Musculoskeletal: Moderate mid thoracic spondylosis.  IMPRESSION: 1. Decreased size of a right middle lobe lung nodule. No new pulmonary metastasis identified. 2. No thoracic adenopathy. A subcarinal node is similar in size and not pathologic by size criteria. 3. Coronary artery atherosclerosis. 4. Mild hepatic steatosis.   Electronically Signed   By: Abigail Miyamoto M.D.   On: 11/13/2014 10:38   Dg Chest Port 1 View  11/17/2014   CLINICAL DATA:  Status post thoracotomy and partial lobectomy for lung nodule  EXAM: PORTABLE CHEST - 1 VIEW  COMPARISON:  Portable chest x-ray of November 16, 2014  FINDINGS: The lungs remain mildly hypoinflated. There is persistent bibasilar atelectasis greater on the right than on the left. A small  right apical pneumothorax is now visible. The right-sided chest tube has been partially withdrawn with its tip lying at the lung base. The cardiac silhouette remains enlarged. The pulmonary vascularity is not engorged. There is no significant pleural effusion. The right internal jugular venous catheter tip projects over the midportion of the SVC. The bony thorax exhibits no acute abnormality.  IMPRESSION: Small (less than 10%) right apical pneumothorax now visible. Persistent bibasilar atelectasis greater on the right than on the left.  These results were called by telephone at the time of interpretation on 11/17/2014 at 7:37 am to Tera Partridge, RN, who verbally acknowledged these results.   Electronically Signed   By: David  Martinique M.D.   On: 11/17/2014 07:39   Dg Chest Port 1 View  11/16/2014   CLINICAL  DATA:  Right lung surgery.  EXAM: PORTABLE CHEST - 1 VIEW  COMPARISON:  11/15/2014.  FINDINGS: Right IJ line in stable position. Two right chest tube is been slightly withdrawn. No pneumothorax. Stable cardiomegaly. Bibasilar atelectasis and/or infiltrates. No pleural effusion or pneumothorax.  IMPRESSION: 1. Right chest tube is been slightly withdrawn. No pneumothorax. Right IJ line in stable position. 2. Low lung volumes with bibasilar subsegmental atelectasis and/or infiltrates again noted . 3. Stable cardiomegaly .   Electronically Signed   By: Marcello Moores  Register   On: 11/16/2014 07:57   Dg Chest Port 1 View  11/15/2014   CLINICAL DATA:  Status post partial lobectomy of lung  EXAM: PORTABLE CHEST - 1 VIEW  COMPARISON:  11/14/2014  FINDINGS: Right IJ catheter tip is in the cavoatrial junction. There is a right-sided chest tube in place. No pneumothorax visualized. The lung volumes are low and there is atelectasis in the lung bases.  IMPRESSION: 1. Right chest tube in place without pneumothorax. 2. Bibasilar atelectasis.   Electronically Signed   By: Kerby Moors M.D.   On: 11/15/2014 11:57

## 2014-11-30 NOTE — Assessment & Plan Note (Signed)
64 year old gentleman with history of stage III rectal adenocarcinoma status post preoperative radiation and chemotherapy followed by definitive surgery, postoperative chemotherapy several years ago, recent pulmonary metastasis status post resection around 10 days ago, on Xarelto for bilateral DVT/PE/clotted IVC filter back in May who presents for recent black stools, Hemoccult-positive stools.  Patient developed black stools a couple of days after starting Pepto-Bismol for upset stomach. He was evaluated twice in the emergency department. First time he was Hemoccult-positive on the specimen that was brought in, patient notes "it about killed me to have that hard stool". Hemoglobin has been stable and normal on both ER visits. Second ER visit couple of days ago, heme negative. Since stopping Pepto-Bismol, stools have returned normal. Likely not true melena, likely related to Pepto. Likely Hemoccult-positive stool explained by straining or other benign etiology. It is notable that he is up-to-date on colonoscopy, at one in July 2015. Completely asymptomatic from a GI standpoint.  I will discuss further with Dr. [name]. At this time do not anticipate any type of endoscopic evaluation. Further recommendations to follow.

## 2014-12-04 NOTE — Progress Notes (Signed)
CC'ED TO PCP 

## 2014-12-11 ENCOUNTER — Other Ambulatory Visit: Payer: Self-pay | Admitting: Cardiothoracic Surgery

## 2014-12-11 DIAGNOSIS — Z902 Acquired absence of lung [part of]: Secondary | ICD-10-CM

## 2014-12-12 ENCOUNTER — Ambulatory Visit (INDEPENDENT_AMBULATORY_CARE_PROVIDER_SITE_OTHER): Payer: Self-pay | Admitting: Cardiothoracic Surgery

## 2014-12-12 ENCOUNTER — Encounter: Payer: Self-pay | Admitting: Cardiothoracic Surgery

## 2014-12-12 ENCOUNTER — Ambulatory Visit
Admission: RE | Admit: 2014-12-12 | Discharge: 2014-12-12 | Disposition: A | Discharge status: Home / Self Care | Payer: Medicare Other | Source: Ambulatory Visit | Attending: Cardiothoracic Surgery | Admitting: Cardiothoracic Surgery

## 2014-12-12 VITALS — BP 144/86 | HR 67 | Resp 20 | Ht 74.0 in | Wt 220.0 lb

## 2014-12-12 DIAGNOSIS — C7801 Secondary malignant neoplasm of right lung: Secondary | ICD-10-CM

## 2014-12-12 DIAGNOSIS — C19 Malignant neoplasm of rectosigmoid junction: Secondary | ICD-10-CM

## 2014-12-12 DIAGNOSIS — Z902 Acquired absence of lung [part of]: Secondary | ICD-10-CM

## 2014-12-12 DIAGNOSIS — R911 Solitary pulmonary nodule: Secondary | ICD-10-CM

## 2014-12-12 DIAGNOSIS — Z9889 Other specified postprocedural states: Secondary | ICD-10-CM

## 2014-12-12 NOTE — Progress Notes (Signed)
[address]       [place] [address]             [phone]      Charles [name] Medical Record #[id number] Date of Birth: [birth year]  Referring: Charles [name]., MD Primary Care: Charles [name]., MD  Chief Complaint:   POST OP FOLLOW UP 11/15/2014 OPERATIVE REPORT PREOPERATIVE DIAGNOSIS: Right lung lesion, solitary pulmonary metastasis, previous colon cancer. POSTOPERATIVE DIAGNOSIS: Right lung lesion, solitary pulmonary metastasis, previous colon cancer. SURGICAL PROCEDURE: Bronchoscopy, right video-assisted thoracoscopy, wedge resection of right middle lobe.  History of Present Illness:     Patient doing well after resection of his isolated pulmonary metastasis from a previous colon cancer. The patient had been on Coumadin preoperatively for DVT. Postop this was converted to Xarelto. After discharge the patient did start taking Pepto-Bismol and had some dark stools that were concerning for possible bleeding related to anticoagulation, however once was discovered he was taking Pepto-Bismol and he stopped the dark stools resolved.  The patient is return to near-normal activities following surgery, denies fever chills or cough.     Past Medical History  Diagnosis Date  . HTN (hypertension)   . Pulmonary embolism   . GERD (gastroesophageal reflux disease)   . Depression 04/01/2011  . DVT (deep venous thrombosis) 05/09/2011  . 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 Colon cancer 07/24/10    rectal ca, inv adenocarcinoma  . Hx of radiation therapy 09/02/10 to 10/14/10    pelvis  . Rectal cancer 01/15/2011    S/P radiation and concurrent 5-FU continuous infusion from 09/09/10- 10/10/10.  S/P proctectomy with colorectal anastomosis and diverting loop ileostomy on 11/14/10 at [facility] by Dr. Harlon Ditty. Pathology reveals a pT3b N1 with 3/20 lymph nodes.    . Lung metastasis   . Peripheral vascular disease     dvt's,pe  . Pneumonia     hx x3  .  Chronic anticoagulation      History  Smoking status  . Never Smoker   Smokeless tobacco  . Never Used    History  Alcohol Use No     Allergies  Allergen Reactions  . Oxycodone     Blisters, hallucinations  . Tramadol     Blisters, hallucinations  . Trazodone And Nefazodone Other (See Comments)    hallucinations    Current Outpatient Prescriptions  Medication Sig Dispense Refill  . ALPRAZolam (XANAX) 0.25 MG tablet Take 1 tablet (0.25 mg total) by mouth 3 (three) times daily as needed for anxiety. 45 tablet 0  . clonazePAM (KLONOPIN) 0.5 MG tablet Take 0.5 mg by mouth at bedtime.     . diphenhydramine-acetaminophen (TYLENOL PM) 25-500 MG TABS Take 1 tablet by mouth at bedtime as needed (sleep).     . gabapentin (NEURONTIN) 300 MG capsule Take 3 capsules (900 mg total) by mouth 2 (two) times daily. 180 capsule 0  . omeprazole (PRILOSEC OTC) 20 MG tablet Take 20 mg by mouth daily.    . psyllium (METAMUCIL) 58.6 % powder Take 1 packet by mouth 2 (two) times daily.     . rivaroxaban (XARELTO) 20 MG TABS tablet Take 1 tablet (20 mg total) by mouth daily with supper. 30 tablet 3  . tamsulosin (FLOMAX) 0.4 MG CAPS capsule Take 1 capsule (0.4 mg total) by mouth daily. 30 capsule 3  . vitamin B-12 (CYANOCOBALAMIN) 1000 MCG tablet Take 1,000 mcg by mouth daily.    Marland Kitchen  vitamin E 400 UNIT capsule Take 400 Units by mouth daily.     No current facility-administered medications for this visit.   Facility-Administered Medications Ordered in Other Visits  Medication Dose Route Frequency Provider Last Rate Last Dose  . sodium chloride 0.9 % injection 10 mL  10 mL Intravenous PRN Everardo All, MD   10 mL at 08/22/11 1114       Physical Exam: BP 144/86 mmHg  Pulse 67  Resp 20  Ht '6\' 2"'$  (1.88 m)  SpO2 97%  General appearance: alert, cooperative and appears stated age Neurologic: intact Heart: regular rate and rhythm, S1, S2 normal, no murmur, click, rub or gallop Lungs: clear to  auscultation bilaterally Abdomen: soft, non-tender; bowel sounds normal; no masses,  no organomegaly Extremities: extremities normal, atraumatic, no cyanosis or edema and Homans sign is negative, no sign of DVT Wound: Patient's wound is intact   Diagnostic Studies & Laboratory data:     Recent Radiology Findings:   Dg Chest 2 View  12/12/2014   CLINICAL DATA:  Status post partial right lobectomy on November 15, 2014, history of colonic and rectal malignancy in 2012  EXAM: CHEST  2 VIEW  COMPARISON:  Chest x-ray of November 18, 2014  FINDINGS: The lungs are well-expanded. Parenchymal density in the right infrahilar region is less conspicuous but persists. There is no pneumothorax. There is a trace of blunting of the right posterior costophrenic angle. The heart and pulmonary vascularity are normal. The mediastinum is normal in width.  IMPRESSION: Persistent increased density in the right infrahilar region in the anterior aspect of the hemithorax consistent with residual atelectasis or scarring. Interval decrease in pleural effusions. No acute cardiopulmonary abnormality is observed.   Electronically Signed   By: Charles  [name] M.D.   On: 12/12/2014 15:20      Recent Lab Findings: Lab Results  Component Value Date   WBC 7.1 11/26/2014   HGB 15.4 11/26/2014   HCT 45.3 11/26/2014   PLT 315 11/26/2014   GLUCOSE 150* 11/26/2014   ALT 27 11/26/2014   AST 15 11/26/2014   NA 138 11/26/2014   K 3.8 11/26/2014   CL 103 11/26/2014   CREATININE 0.87 11/26/2014   BUN 12 11/26/2014   CO2 27 11/26/2014   TSH 1.219 01/15/2011   INR 1.51* 11/26/2014      Assessment / Plan:    Stage IV CRC s/p surgical resection of solitary pulmonary nodule on 11/16/2014 doing well postoperatively History of Stage III CRC History of IVC filter in 2012 Acute DVT/PE 09/2014, s/p catheter lysis of DVT On Anticoagulation therapy- XARELTO  Patient is closely followed at [facility], with a follow-up CT scan are  rescheduled for 3 months postop. I have not made a return appointment for him to see me but would be glad to see him at his or Dr. Donald Pore requested anytime.    Charles Isaac MD      [address] [place] [address] Office [phone]   Beeper [phone]  12/12/2014 4:35 PM

## 2015-01-12 ENCOUNTER — Other Ambulatory Visit (HOSPITAL_COMMUNITY): Payer: Self-pay

## 2015-01-12 ENCOUNTER — Ambulatory Visit (HOSPITAL_COMMUNITY): Payer: Self-pay | Admitting: Hematology & Oncology

## 2015-02-02 NOTE — Progress Notes (Signed)
Please check on patient to see if he had any further melena since stopping Pepto. If not, then follow up with RMR in 05/2015.  Please NIC for OV with RMR in 05/2015.

## 2015-02-09 NOTE — Progress Notes (Signed)
Spoke with the pts wife. She said he has been doing very well and has not had any episodes of melena.   Please nic ov with RMR in 05/2015

## 2015-02-12 NOTE — Progress Notes (Signed)
ON RECALL  °

## 2015-02-21 ENCOUNTER — Other Ambulatory Visit (HOSPITAL_COMMUNITY): Payer: Self-pay

## 2015-02-21 ENCOUNTER — Ambulatory Visit (HOSPITAL_COMMUNITY): Payer: Self-pay | Admitting: Hematology & Oncology

## 2015-02-27 ENCOUNTER — Other Ambulatory Visit (HOSPITAL_COMMUNITY): Payer: Self-pay

## 2015-02-28 ENCOUNTER — Other Ambulatory Visit (HOSPITAL_COMMUNITY): Payer: Medicare Other

## 2015-03-02 ENCOUNTER — Ambulatory Visit (HOSPITAL_COMMUNITY): Payer: Self-pay | Admitting: Hematology & Oncology

## 2015-03-02 ENCOUNTER — Other Ambulatory Visit (HOSPITAL_COMMUNITY): Payer: Self-pay

## 2015-03-07 ENCOUNTER — Ambulatory Visit (HOSPITAL_COMMUNITY)
Admission: RE | Admit: 2015-03-07 | Discharge: 2015-03-07 | Disposition: A | Discharge status: Home / Self Care | Payer: Medicare Other | Source: Ambulatory Visit | Attending: Hematology & Oncology | Admitting: Hematology & Oncology

## 2015-03-07 ENCOUNTER — Encounter (HOSPITAL_COMMUNITY): Payer: Medicare Other | Attending: Oncology

## 2015-03-07 DIAGNOSIS — Z923 Personal history of irradiation: Secondary | ICD-10-CM | POA: Insufficient documentation

## 2015-03-07 DIAGNOSIS — C7800 Secondary malignant neoplasm of unspecified lung: Secondary | ICD-10-CM | POA: Insufficient documentation

## 2015-03-07 DIAGNOSIS — Z9049 Acquired absence of other specified parts of digestive tract: Secondary | ICD-10-CM | POA: Insufficient documentation

## 2015-03-07 DIAGNOSIS — R911 Solitary pulmonary nodule: Secondary | ICD-10-CM

## 2015-03-07 DIAGNOSIS — Z9221 Personal history of antineoplastic chemotherapy: Secondary | ICD-10-CM | POA: Insufficient documentation

## 2015-03-07 DIAGNOSIS — Z08 Encounter for follow-up examination after completed treatment for malignant neoplasm: Secondary | ICD-10-CM | POA: Insufficient documentation

## 2015-03-07 DIAGNOSIS — C20 Malignant neoplasm of rectum: Secondary | ICD-10-CM | POA: Insufficient documentation

## 2015-03-07 DIAGNOSIS — Z902 Acquired absence of lung [part of]: Secondary | ICD-10-CM | POA: Diagnosis not present

## 2015-03-07 DIAGNOSIS — I2510 Atherosclerotic heart disease of native coronary artery without angina pectoris: Secondary | ICD-10-CM | POA: Insufficient documentation

## 2015-03-07 LAB — CBC WITH DIFFERENTIAL/PLATELET
Basophils Absolute: 0 10*3/uL (ref 0.0–0.1)
Basophils Relative: 0 %
Eosinophils Absolute: 0.2 10*3/uL (ref 0.0–0.7)
Eosinophils Relative: 2 %
HCT: 43.4 % (ref 39.0–52.0)
Hemoglobin: 14.8 g/dL (ref 13.0–17.0)
Lymphocytes Relative: 31 %
Lymphs Abs: 2.1 10*3/uL (ref 0.7–4.0)
MCH: 29.5 pg (ref 26.0–34.0)
MCHC: 34.1 g/dL (ref 30.0–36.0)
MCV: 86.6 fL (ref 78.0–100.0)
Monocytes Absolute: 0.4 10*3/uL (ref 0.1–1.0)
Monocytes Relative: 6 %
Neutro Abs: 4 10*3/uL (ref 1.7–7.7)
Neutrophils Relative %: 61 %
Platelets: 225 10*3/uL (ref 150–400)
RBC: 5.01 MIL/uL (ref 4.22–5.81)
RDW: 13.2 % (ref 11.5–15.5)
WBC: 6.6 10*3/uL (ref 4.0–10.5)

## 2015-03-07 LAB — COMPREHENSIVE METABOLIC PANEL
ALT: 22 U/L (ref 17–63)
AST: 18 U/L (ref 15–41)
Albumin: 4 g/dL (ref 3.5–5.0)
Alkaline Phosphatase: 71 U/L (ref 38–126)
Anion gap: 7 (ref 5–15)
BUN: 11 mg/dL (ref 6–20)
CO2: 28 mmol/L (ref 22–32)
Calcium: 9.1 mg/dL (ref 8.9–10.3)
Chloride: 107 mmol/L (ref 101–111)
Creatinine, Ser: 0.87 mg/dL (ref 0.61–1.24)
GFR calc Af Amer: >60 mL/min (ref >60)
GFR calc non Af Amer: >60 mL/min (ref >60)
Glucose, Bld: 122 mg/dL — ABNORMAL HIGH (ref 65–99)
Potassium: 4.5 mmol/L (ref 3.5–5.1)
Sodium: 142 mmol/L (ref 135–145)
Total Bilirubin: 1.6 mg/dL — ABNORMAL HIGH (ref 0.3–1.2)
Total Protein: 6.8 g/dL (ref 6.5–8.1)

## 2015-03-07 MED ORDER — IOHEXOL 300 MG/ML  SOLN
100.0000 mL | Freq: Once | INTRAMUSCULAR | Status: AC | PRN
  Administered 2015-03-07: 100 mL via INTRAVENOUS

## 2015-03-08 LAB — CEA: CEA: 1.6 ng/mL (ref 0.0–4.7)

## 2015-03-09 NOTE — Progress Notes (Signed)
LABS DRAWN

## 2015-03-13 ENCOUNTER — Encounter (HOSPITAL_BASED_OUTPATIENT_CLINIC_OR_DEPARTMENT_OTHER): Payer: Medicare Other | Admitting: Hematology & Oncology

## 2015-03-13 VITALS — BP 142/90 | HR 83 | Temp 98.7°F | Resp 16 | Wt 228.8 lb

## 2015-03-13 DIAGNOSIS — C20 Malignant neoplasm of rectum: Secondary | ICD-10-CM | POA: Diagnosis not present

## 2015-03-13 DIAGNOSIS — C7800 Secondary malignant neoplasm of unspecified lung: Secondary | ICD-10-CM

## 2015-03-13 DIAGNOSIS — Z23 Encounter for immunization: Secondary | ICD-10-CM | POA: Diagnosis not present

## 2015-03-13 DIAGNOSIS — G620 Drug-induced polyneuropathy: Secondary | ICD-10-CM

## 2015-03-13 MED ORDER — INFLUENZA VAC SPLIT QUAD 0.5 ML IM SUSY
0.5000 mL | PREFILLED_SYRINGE | Freq: Once | INTRAMUSCULAR | Status: AC
  Administered 2015-03-13: 0.5 mL via INTRAMUSCULAR
  Filled 2015-03-13: qty 0.5

## 2015-03-13 MED ORDER — GABAPENTIN 300 MG PO CAPS: ORAL_CAPSULE | ORAL | Status: DC

## 2015-03-13 NOTE — Progress Notes (Signed)
Charles [name]., MD [place] [address]  History of Stage III cancer of the rectum status post preoperative radiation and chemotherapy followed by definitive surgery. He then underwent adjuvant chemotherapy with FOLFOX. During the radiation therapy and continuous infusion 5-FU from 09/09/2010 through 10/10/2010. FOLFOX was administered from October 2012 through 07/14/2011  Stage IV CRC with solitary pulmonary metastases s/p surgical resection with Dr.Gerhardt  CURRENT THERAPY: Surveillance per NCCN guidelines  INTERVAL HISTORY: Charles [name] 64 y.o. male returns for followup of stage IV CRC. He has done exceptionally well since his surgery. He has no complaints today. He went to the ED secondary to dark stool. He has been referred to Dr. Sydell Axon. He continues on Xarelto.  Charles [name] is accompanied by his wife and here today to discuss the results of his most recent CT scans. He has been doing well, eating and sleeping well. He denies any trouble breathing. His wife notes that he wears a mask if he is out around odors or dust. He does request an increase to his gabapentin.   He has not received a flu shot this year.  She denies nausea and vomiting, constipation, diarrhea, change in appetite or energy level.    Rectal cancer ([place])   07/24/2010 Initial Diagnosis Invasive adenocarcinoma of rectum   09/09/2010 Concurrent Chemotherapy S/P radiation and concurrent 5-FU continuous infusion from 09/09/10- 10/10/10.   11/14/2010 Surgery S/P proctectomy with colorectal anastomosis and diverting loop ileostomy on 11/14/10 at [facility] by Dr. Harlon Ditty. Pathology reveals a pT3b N1 with 3/20 lymph nodes.   02/05/2011 - 07/14/2011 Chemotherapy FOLFOX   08/18/2011 Surgery Approximate date of surgery- [place] by Dr. Harlon Ditty    Remission    11/24/2013 Survivorship Colonoscopy- somewhat fibrotic/friable anastomotic mucosal-status post biopsy (narrowing not felt to be clinically  significant).  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 The right iliac venous system is patent and do   09/25/2014 PET scan The right middle lobe pulmonary nodule is hypermetabolic, favored to represent a primary bronchogenic carcinoma.Equivocal mediastinal nodes, similar to surrounding blood pool. Bilateral adrenal hypermetabolism, felt to be physiologic   10/24/2014 Pathology Results Lung, needle/core biopsy(ies), RML - ADENOCARCINOMA, SEE COMMENT metastatic adenocarcinoma of a colorectal primary     Past Medical History  Diagnosis Date  . HTN (hypertension)   . Pulmonary embolism   . GERD (gastroesophageal reflux disease)   . Depression 04/01/2011  . DVT (deep venous thrombosis) 05/09/2011  . 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 Colon cancer 07/24/10    rectal ca, inv adenocarcinoma  . Hx of radiation therapy 09/02/10 to 10/14/10    pelvis  . Rectal cancer 01/15/2011    S/P radiation and concurrent 5-FU continuous infusion from 09/09/10- 10/10/10.  S/P proctectomy with colorectal anastomosis and diverting loop ileostomy on 11/14/10 at [facility] by Dr. Harlon Ditty. Pathology reveals a pT3b N1 with 3/20 lymph nodes.    . Lung metastasis   . Peripheral vascular disease     dvt's,pe  . Pneumonia     hx x3  . Chronic anticoagulation     has GERD; Rectal cancer ([place]); Hx of radiation therapy; Stiffness of joint, not elsewhere classified, ankle and foot; Weakness of both legs; Peripheral neuropathy due to oxaliplatin-chemotherapy; Diarrhea; Acute  pulmonary embolism ([place]); Lactic acidosis; Lower abdominal pain; Urinary retention; Lung nodule, solitary; DVT (deep venous thrombosis) ([place]); Lung nodule; S/P partial lobectomy of lung; GI bleed; Chronic anticoagulation; Constipation; Heme  positive stool; Gastritis; S/P thoracotomy; and History of rectal cancer on his problem list.     is allergic to oxycodone; tramadol; and trazodone and nefazodone.  Charles [name] had no medications administered during this visit.  Past Surgical History  Procedure Laterality Date  . Esophagogastroduodenoscopy  07/2010    RMR: schatki ring s/p dilation, small hh, SB bx benign  . Colonoscopy  07/2010    proximal rectal apple core mass 10-14cm from anal verge (adenocarcinoma), 2-3cm distal rectal carpet polyp s/p piecemeal snare polypectomy (adenoma)  . Eus  08/2010    Dr. Owens Loffler. uT3N0 circumferential, nearly obstruction rectosigmoid adenocarcinoma, distal edge 12cm from anal verge  . Ivc filter    . Colon surgery  11/14/2010    proctectomy with colorectal anastomosis and diverting loop ileostomy (temporary planned)  . Port a cath placement    . Colostomy reversal  april 2013  . Port-a-cath removal  09/24/2011    Procedure: REMOVAL PORT-A-CATH;  Surgeon: Donato Heinz, MD;  Location: [facility];  Service: General;  Laterality: N/A;  Minor Room  . Ivc filter    . Colonoscopy  04/21/2012    RMR: Friable,fibrotic appearing colorectal anastomosis producing some luminal narrowing-not felt to be critical. path: focal erosion with slight inflammation and hyperemia. SURVEILLANCE DUE DEC 2015  . Colonoscopy N/A 11/24/2013    Dr. Rourk:somewhat fibrotic/friable anastomotic mucosal-status post biopsy (narrowing not felt to be clinically significant) Single colonic diverticulum. benign polypoid rectal mucosa  . Hernia repair      abd hernia repair  . Video bronchoscopy N/A 11/15/2014    Procedure: VIDEO BRONCHOSCOPY;  Surgeon: [name], MD;  Location: [facility] OR;  Service: Thoracic;  Laterality: N/A;  . Video assisted thoracoscopy (vats)/wedge resection Right 11/15/2014    Procedure: VIDEO ASSISTED THORACOSCOPY (VATS)/LUNG RESECTION WITH RIGHT LINGULECTOMY;  Surgeon: [name], MD;   Location: [place];  Service: Thoracic;  Laterality: Right;    Denies any headaches, dizziness, double vision, fevers, chills, night sweats, nausea, vomiting, diarrhea, constipation, chest pain, heart palpitations, shortness of breath, blood in stool, black tarry stool, urinary pain, urinary burning, urinary frequency, hematuria. 14 point review of systems was performed and is negative except as detailed under history of present illness and above    PHYSICAL EXAMINATION  ECOG PERFORMANCE STATUS: 0 - Asymptomatic  Filed Vitals:   03/13/15 1300  BP: 142/90  Pulse: 83  Temp: 98.7 F (37.1 C)  Resp: 16    GENERAL:alert, no distress, well nourished, well developed, comfortable, cooperative, obese and smiling, unaccompanied  SKIN: skin color, texture, turgor are normal, no rashes or significant lesions HEAD: Normocephalic, No masses, lesions, tenderness or abnormalities EYES: normal, PERRLA, EOMI, Conjunctiva are pink and non-injected EARS: External ears normal OROPHARYNX:lips, buccal mucosa, and tongue normal and mucous membranes are moist  NECK: supple, no adenopathy, thyroid normal size, non-tender, without nodularity, no stridor, non-tender, trachea midline LYMPH:  no palpable lymphadenopathy, no hepatosplenomegaly BREAST:not examined LUNGS: clear to auscultation and percussion. Well healing surgical incision sites HEART: regular rate & rhythm, no murmurs, no gallops, S1 normal and S2 normal ABDOMEN:abdomen soft, non-tender, obese, normal bowel sounds, no masses or organomegaly and no hepatosplenomegaly BACK: Back symmetric, no curvature., No CVA tenderness EXTREMITIES:less then 2 second capillary refill, no joint deformities, effusion, or inflammation, no  edema, no skin discoloration, no clubbing, no cyanosis  NEURO: alert & oriented x 3 with fluent speech, no focal motor/sensory deficits, gait normal   LABORATORY DATA: Results for LENNIS, KORB (MRN [id number]) as of 03/13/2015  13:39  Ref. Range 03/07/2015 09:02  Sodium Latest Ref Range: 135-145 mmol/L 142  Potassium Latest Ref Range: 3.5-5.1 mmol/L 4.5  Chloride Latest Ref Range: 101-111 mmol/L 107  CO2 Latest Ref Range: 22-32 mmol/L 28  BUN Latest Ref Range: 6-20 mg/dL 11  Creatinine Latest Ref Range: 0.61-1.24 mg/dL 0.87  Calcium Latest Ref Range: 8.9-10.3 mg/dL 9.1  EGFR (Non-African Amer.) Latest Ref Range: >60 mL/min >60  EGFR (African American) Latest Ref Range: >60 mL/min >60  Glucose Latest Ref Range: 65-99 mg/dL 122 (H)  Anion gap Latest Ref Range: 5-15  7  Alkaline Phosphatase Latest Ref Range: 38-126 U/L 71  Albumin Latest Ref Range: 3.5-5.0 g/dL 4.0  AST Latest Ref Range: 15-41 U/L 18  ALT Latest Ref Range: 17-63 U/L 22  Total Protein Latest Ref Range: 6.5-8.1 g/dL 6.8  Total Bilirubin Latest Ref Range: 0.3-1.2 mg/dL 1.6 (H)  WBC Latest Ref Range: 4.0-10.5 K/uL 6.6  RBC Latest Ref Range: 4.22-5.81 MIL/uL 5.01  Hemoglobin Latest Ref Range: 13.0-17.0 g/dL 14.8  HCT Latest Ref Range: 39.0-52.0 % 43.4  MCV Latest Ref Range: 78.0-100.0 fL 86.6  MCH Latest Ref Range: 26.0-34.0 pg 29.5  MCHC Latest Ref Range: 30.0-36.0 g/dL 34.1  RDW Latest Ref Range: 11.5-15.5 % 13.2  Platelets Latest Ref Range: 150-400 K/uL 225  Neutrophils Latest Units: % 61  Lymphocytes Latest Units: % 31  Monocytes Relative Latest Units: % 6  Eosinophil Latest Units: % 2  Basophil Latest Units: % 0  NEUT# Latest Ref Range: 1.7-7.7 K/uL 4.0  Lymphocyte # Latest Ref Range: 0.7-4.0 K/uL 2.1  Monocyte # Latest Ref Range: 0.1-1.0 K/uL 0.4  Eosinophils Absolute Latest Ref Range: 0.0-0.7 K/uL 0.2  Basophils Absolute Latest Ref Range: 0.0-0.1 K/uL 0.0  CEA Latest Ref Range: 0.0-4.7 ng/mL 1.6    RADIOLOGY: CLINICAL DATA: Stage IV rectal cancer diagnosed in 2012 with surgery, chemotherapy, and radiation therapy. Pulmonary metastasis diagnosed 6/16. Wedge resection with VATS on 11/15/2014.  EXAM: CT CHEST, ABDOMEN, AND PELVIS  WITH CONTRAST  TECHNIQUE: Multidetector CT imaging of the chest, abdomen and pelvis was performed following the standard protocol during bolus administration of intravenous contrast.  CONTRAST: 121m OMNIPAQUE IOHEXOL 300 MG/ML SOLN  COMPARISON: 09/25/2014 PET. Prior chest abdomen and pelvic CTs of 09/08/2014  FINDINGS: CT CHEST FINDINGS  Mediastinum/Nodes: No supraclavicular adenopathy. Borderline cardiomegaly, without pericardial effusion. Multivessel coronary artery atherosclerosis. No central pulmonary embolism, on this non-dedicated study. 11 mm subcarinal node is similar (image 31, series 2). This is upper normal size. No hilar adenopathy. 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  Tiny hiatal hernia.  Lungs/Pleura: No pleural fluid. Improved posterior right upper lobe reticular nodular opacity (image 20, series 3). Interval right middle lobe pulmonary nodule resection. Clear left lung, without new pulmonary nodule.  Musculoskeletal: No acute osseous abnormality.  CT ABDOMEN PELVIS FINDINGS  Hepatobiliary: Possible gallstone. Normal liver. No biliary duct dilatation.  Pancreas: Normal, without mass or ductal dilatation.  Spleen: Normal in size, without focal abnormality.  Adrenals/Urinary Tract: Left adrenal calcification is similar. Normal right adrenal gland. Normal kidneys, without hydronephrosis. Apparent bladder wall irregularity (image 124, series 2)is favored to be due to underdistention.  Stomach/Bowel: Proximal gastric underdistention. Surgical sutures at the rectosigmoid junction. Normal terminal ileum and appendix. Normal small  bowel caliber. Mid small bowel loops are positioned anteriorly, suggesting adhesions. Small bowel is positioned just deep to an area of right lower quadrant wall irregularity, possibly a prior ostomy site (image 95, series 2).  Vascular/Lymphatic: Aortic and branch vessel atherosclerosis.  IVC filter which is appropriately positioned. No abdominopelvic adenopathy.  Reproductive: Normal prostate.  Other: No significant free fluid. Fat containing left inguinal hernia is tiny. No evidence of omental or peritoneal disease.  Musculoskeletal: Degenerative disc disease at L5-S1.  IMPRESSION: 1. Interval resection of right middle lobe metastasis. No acute process or evidence of metastatic disease in the chest, abdomen or pelvis. 2. Improved right upper lobe reticular nodular opacities are favored to represent resolving infection. 3. Atherosclerosis, including within the coronary arteries. 4. Small bowel positioned anteriorly within the abdomen, possibly representing adhesions. Bowel is also positioned just deep to a presumed remote right lower quadrant ostomy site. No obstruction or other acute complication. 5. Possible cholelithiasis.   Electronically Signed  By: Abigail Miyamoto M.D.  On: 03/07/2015 12:52   ASSESSMENT AND PLAN:  Stage IV CRC s/p surgical resection of solitary pulmonary nodule on 11/16/2014 Bronchoscopy, R VATS, wedge resection RML 11/16/2014 with Dr. Servando Snare History of Stage III CRC Treatment related neuropathy History of IVC filter in 2012 Acute DVT/PE 09/2014, s/p catheter lysis of DVT XARELTO Anxiety on Lexapro  We reviewed his pathology. He has done well with his surgery. We reviewed the NCCN guidelines in detail. I advised him that in the setting of prior oxaliplatin based chemotherapy, observation is not unreasonable. We reviewed his CT scans today.  He is pleased with the results.   We discussed the possibility/probability that this can recur again. He and his wife are both aware. We also discussed that surgical resection can be curative, but data in resection of extrahepatic metastases is limited.  There is evidence pointing to a higher likelihood of recurrence in resected extrahepatic disease.  I have increased his gabapentin dosage at  the patient's request.   He received a flu shot today.  We will repeat scans in 6 months. I will see him again in 3 months for routine follow-up.  All questions were answered. The patient knows to call the clinic with any problems, questions or concerns. We can certainly see the patient much sooner if necessary.   This document serves as a record of services personally performed by Ancil Linsey, MD. It was created on her behalf by Arlyce Harman, a trained medical scribe. The creation of this record is based on the scribe's personal observations and the provider's statements to them. This document has been checked and approved by the attending provider.  I have reviewed the above documentation for accuracy and completeness, and I agree with the above.  This note was electronically signed.   Kelby Fam. Whitney Muse, MD

## 2015-03-13 NOTE — Patient Instructions (Signed)
Charles [name] at [facility] Discharge Instructions  RECOMMENDATIONS MADE BY THE CONSULTANT AND ANY TEST RESULTS WILL BE SENT TO YOUR REFERRING PHYSICIAN.  Exam and discussion by Dr. Whitney Muse Lab and scans are good Flu Vaccine today Neurontin - take as directed  Office visit in 3 months.    Thank you for choosing [place] at [facility] to provide your oncology and hematology care.  To afford each patient quality time with our provider, please arrive at least 15 minutes before your scheduled appointment time.    You need to re-schedule your appointment should you arrive 10 or more minutes late.  We strive to give you quality time with our providers, and arriving late affects you and other patients whose appointments are after yours.  Also, if you no show three or more times for appointments you may be dismissed from the clinic at the providers discretion.     Again, thank you for choosing [facility].  Our hope is that these requests will decrease the amount of time that you wait before being seen by our physicians.       _____________________________________________________________  Should you have questions after your visit to [facility], please contact our office at [phone] between the hours of 8:30 a.m. and 4:30 p.m.  Voicemails left after 4:30 p.m. will not be returned until the following business day.  For prescription refill requests, have your pharmacy contact our office.

## 2015-03-13 NOTE — Progress Notes (Signed)
Charles [name] presents today for injection per MD orders. Flu Vaccine administered IM in left deltoid. Administration without incident. Patient tolerated well.

## 2015-04-25 ENCOUNTER — Encounter: Payer: Self-pay | Admitting: Internal Medicine

## 2015-04-26 ENCOUNTER — Encounter (HOSPITAL_COMMUNITY): Payer: Self-pay | Admitting: Hematology & Oncology

## 2015-06-13 ENCOUNTER — Encounter (HOSPITAL_COMMUNITY): Payer: Self-pay | Admitting: Hematology & Oncology

## 2015-06-13 ENCOUNTER — Encounter (HOSPITAL_COMMUNITY): Payer: Medicare Other | Attending: Oncology | Admitting: Hematology & Oncology

## 2015-06-13 ENCOUNTER — Encounter: Payer: Self-pay | Admitting: Internal Medicine

## 2015-06-13 VITALS — BP 147/93 | HR 85 | Temp 98.6°F | Resp 18 | Wt 225.4 lb

## 2015-06-13 DIAGNOSIS — C7801 Secondary malignant neoplasm of right lung: Secondary | ICD-10-CM

## 2015-06-13 DIAGNOSIS — Z86718 Personal history of other venous thrombosis and embolism: Secondary | ICD-10-CM

## 2015-06-13 DIAGNOSIS — Z7901 Long term (current) use of anticoagulants: Secondary | ICD-10-CM

## 2015-06-13 DIAGNOSIS — C19 Malignant neoplasm of rectosigmoid junction: Secondary | ICD-10-CM

## 2015-06-13 DIAGNOSIS — C20 Malignant neoplasm of rectum: Secondary | ICD-10-CM

## 2015-06-13 MED ORDER — DEXLANSOPRAZOLE 60 MG PO CPDR: 60.0000 mg | DELAYED_RELEASE_CAPSULE | Freq: Every day | ORAL | Status: DC

## 2015-06-13 MED ORDER — GABAPENTIN 300 MG PO CAPS: ORAL_CAPSULE | ORAL | Status: DC

## 2015-06-13 NOTE — Progress Notes (Signed)
Charles [name]., MD [place] [id number]  History of Stage III cancer of the rectum status post preoperative radiation and chemotherapy followed by definitive surgery. He then underwent adjuvant chemotherapy with FOLFOX. During the radiation therapy and continuous infusion 5-FU from 09/09/2010 through 10/10/2010. FOLFOX was administered from October 2012 through 07/14/2011  Stage IV CRC with solitary pulmonary metastases s/p surgical resection with Dr.Gerhardt  CURRENT THERAPY: Surveillance per NCCN guidelines  INTERVAL HISTORY: Charles [name] 65 y.o. male returns for followup of stage IV CRC. He has done exceptionally well since his surgery. He has no complaints today.   Charles [name] is accompanied by his wife.  Denies any new cough or chest pain. Denies bowel issues. He is eating and sleeping well. His wife reports that the patient has a lot of gas lately, including burping. She believes the prilosec is not helping. He has never had an EGD. He has tried other PPIs that did not help, including nexium and zantac.  The patient notes that heartburn is an issue for him and worse at night. He is requesting another medication.  He notes otherwise activity level is unchanged. He works without difficulty or fatigue.      Rectal cancer ([place])   07/24/2010 Initial Diagnosis Invasive adenocarcinoma of rectum   09/09/2010 Concurrent Chemotherapy S/P radiation and concurrent 5-FU continuous infusion from 09/09/10- 10/10/10.   11/14/2010 Surgery S/P proctectomy with colorectal anastomosis and diverting loop ileostomy on 11/14/10 at [facility] by Dr. Harlon Ditty. Pathology reveals a pT3b N1 with 3/20 lymph nodes.   02/05/2011 - 07/14/2011 Chemotherapy FOLFOX   08/18/2011 Surgery Approximate date of surgery- [place] by Dr. Harlon Ditty    Remission    11/24/2013 Survivorship Colonoscopy- somewhat fibrotic/friable anastomotic mucosal-status post biopsy (narrowing not felt to be clinically  significant).  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 The right iliac venous system is patent and do   09/25/2014 PET scan The right middle lobe pulmonary nodule is hypermetabolic, favored to represent a primary bronchogenic carcinoma.Equivocal mediastinal nodes, similar to surrounding blood pool. Bilateral adrenal hypermetabolism, felt to be physiologic   10/24/2014 Pathology Results Lung, needle/core biopsy(ies), RML - ADENOCARCINOMA, SEE COMMENT metastatic adenocarcinoma of a colorectal primary     Past Medical History  Diagnosis Date  . HTN (hypertension)   . Pulmonary embolism ([place])   . GERD (gastroesophageal reflux disease)   . Depression 04/01/2011  . DVT (deep venous thrombosis) ([place]) 05/09/2011  . High output ileostomy ([place]) 05/21/2011  . Colon cancer ([place]) 07/24/10    rectal ca, inv adenocarcinoma  . Hx of radiation therapy 09/02/10 to 10/14/10    pelvis  . Rectal cancer ([place]) 01/15/2011    S/P radiation and concurrent 5-FU continuous infusion from 09/09/10- 10/10/10.  S/P proctectomy with colorectal anastomosis and diverting loop ileostomy on 11/14/10 at [facility] by Dr. Harlon Ditty. Pathology reveals a pT3b N1 with 3/20 lymph nodes.    . Lung metastasis ([place])   . Peripheral vascular disease ([place])     dvt's,pe  . Pneumonia     hx x3  . Chronic anticoagulation     has GERD; Rectal cancer ([place]); Hx of radiation therapy; Stiffness of joint, not elsewhere classified, ankle and foot; Weakness of both legs; Peripheral neuropathy  due to oxaliplatin-chemotherapy; Diarrhea; Acute pulmonary embolism ([place]); Lactic acidosis; Lower abdominal pain; Urinary retention; Lung nodule, solitary; DVT (deep venous thrombosis) ([place]); Lung nodule; S/P partial lobectomy of lung; GI bleed;  Chronic anticoagulation; Constipation; Heme positive stool; Gastritis; S/P thoracotomy; and History of rectal cancer on his problem list.     is allergic to oxycodone; tramadol; and trazodone and nefazodone.  Charles [name] had no medications administered during this visit.  Past Surgical History  Procedure Laterality Date  . Esophagogastroduodenoscopy  07/2010    RMR: schatki ring s/p dilation, small hh, SB bx benign  . Colonoscopy  07/2010    proximal rectal apple core mass 10-14cm from anal verge (adenocarcinoma), 2-3cm distal rectal carpet polyp s/p piecemeal snare polypectomy (adenoma)  . Eus  08/2010    Dr. Owens Loffler. uT3N0 circumferential, nearly obstruction rectosigmoid adenocarcinoma, distal edge 12cm from anal verge  . Ivc filter    . Colon surgery  11/14/2010    proctectomy with colorectal anastomosis and diverting loop ileostomy (temporary planned)  . Port a cath placement    . Colostomy reversal  april 2013  . Port-a-cath removal  09/24/2011    Procedure: REMOVAL PORT-A-CATH;  Surgeon: Donato Heinz, MD;  Location: [facility];  Service: General;  Laterality: N/A;  Minor Room  . Ivc filter    . Colonoscopy  04/21/2012    RMR: Friable,fibrotic appearing colorectal anastomosis producing some luminal narrowing-not felt to be critical. path: focal erosion with slight inflammation and hyperemia. SURVEILLANCE DUE DEC 2015  . Colonoscopy N/A 11/24/2013    Dr. Rourk:somewhat fibrotic/friable anastomotic mucosal-status post biopsy (narrowing not felt to be clinically significant) Single colonic diverticulum. benign polypoid rectal mucosa  . Hernia repair      abd hernia repair  . Video bronchoscopy N/A 11/15/2014    Procedure: VIDEO BRONCHOSCOPY;  Surgeon: [name], MD;  Location: [facility] OR;  Service: Thoracic;  Laterality: N/A;  . Video assisted thoracoscopy (vats)/wedge resection Right 11/15/2014    Procedure: VIDEO ASSISTED THORACOSCOPY (VATS)/LUNG RESECTION WITH RIGHT  LINGULECTOMY;  Surgeon: [name], MD;  Location: [facility];  Service: Thoracic;  Laterality: Right;    Denies any headaches, dizziness, double vision, fevers, chills, night sweats, nausea, vomiting, diarrhea, constipation, chest pain, heart palpitations, shortness of breath, blood in stool, black tarry stool, urinary pain, urinary burning, urinary frequency, hematuria. Positive for heartburn.    No longer managed by prilosec. 14 point review of systems was performed and is negative except as detailed under history of present illness and above  PHYSICAL EXAMINATION  ECOG PERFORMANCE STATUS: 0 - Asymptomatic  Filed Vitals:   06/13/15 1255  BP: 147/93  Pulse: 85  Temp: 98.6 F (37 C)  Resp: 18   GENERAL:alert, no distress, well nourished, well developed, comfortable, cooperative, obese and smiling, unaccompanied  SKIN: skin color, texture, turgor are normal, no rashes or significant lesions HEAD: Normocephalic, No masses, lesions, tenderness or abnormalities EYES: normal, PERRLA, EOMI, Conjunctiva are pink and non-injected EARS: External ears normal OROPHARYNX:lips, buccal mucosa, and tongue normal and mucous membranes are moist  NECK: supple, no adenopathy, thyroid normal size, non-tender, without nodularity, no stridor, non-tender, trachea midline LYMPH:  no palpable lymphadenopathy, no hepatosplenomegaly BREAST:not examined LUNGS: clear to auscultation and percussion. Well healing surgical incision sites HEART: regular rate & rhythm, no murmurs, no gallops, S1 normal and S2 normal ABDOMEN:abdomen soft, non-tender, obese, normal bowel sounds, no masses or organomegaly and no hepatosplenomegaly BACK: Back symmetric, no curvature., No CVA tenderness  EXTREMITIES:less then 2 second capillary refill, no joint deformities, effusion, or inflammation, no edema, no skin discoloration, no clubbing, no cyanosis  NEURO: alert & oriented x 3 with fluent speech, no focal motor/sensory  deficits, gait normal  LABORATORY DATA: I have reviewed the data as listed. Results for QUINLIN, CONANT (MRN [id number]) as of 06/13/2015 13:27  Ref. Range 03/07/2015 09:02  Sodium Latest Ref Range: 135-145 mmol/L 142  Potassium Latest Ref Range: 3.5-5.1 mmol/L 4.5  Chloride Latest Ref Range: 101-111 mmol/L 107  CO2 Latest Ref Range: 22-32 mmol/L 28  BUN Latest Ref Range: 6-20 mg/dL 11  Creatinine Latest Ref Range: 0.61-1.24 mg/dL 0.87  Calcium Latest Ref Range: 8.9-10.3 mg/dL 9.1  EGFR (Non-African Amer.) Latest Ref Range: >60 mL/min >60  EGFR (African American) Latest Ref Range: >60 mL/min >60  Glucose Latest Ref Range: 65-99 mg/dL 122 (H)  Anion gap Latest Ref Range: 5-15  7  Alkaline Phosphatase Latest Ref Range: 38-126 U/L 71  Albumin Latest Ref Range: 3.5-5.0 g/dL 4.0  AST Latest Ref Range: 15-41 U/L 18  ALT Latest Ref Range: 17-63 U/L 22  Total Protein Latest Ref Range: 6.5-8.1 g/dL 6.8  Total Bilirubin Latest Ref Range: 0.3-1.2 mg/dL 1.6 (H)  WBC Latest Ref Range: 4.0-10.5 K/uL 6.6  RBC Latest Ref Range: 4.22-5.81 MIL/uL 5.01  Hemoglobin Latest Ref Range: 13.0-17.0 g/dL 14.8  HCT Latest Ref Range: 39.0-52.0 % 43.4  MCV Latest Ref Range: 78.0-100.0 fL 86.6  MCH Latest Ref Range: 26.0-34.0 pg 29.5  MCHC Latest Ref Range: 30.0-36.0 g/dL 34.1  RDW Latest Ref Range: 11.5-15.5 % 13.2  Platelets Latest Ref Range: 150-400 K/uL 225  Neutrophils Latest Units: % 61  Lymphocytes Latest Units: % 31  Monocytes Relative Latest Units: % 6  Eosinophil Latest Units: % 2  Basophil Latest Units: % 0  NEUT# Latest Ref Range: 1.7-7.7 K/uL 4.0  Lymphocyte # Latest Ref Range: 0.7-4.0 K/uL 2.1  Monocyte # Latest Ref Range: 0.1-1.0 K/uL 0.4  Eosinophils Absolute Latest Ref Range: 0.0-0.7 K/uL 0.2  Basophils Absolute Latest Ref Range: 0.0-0.1 K/uL 0.0  CEA Latest Ref Range: 0.0-4.7 ng/mL 1.6   RADIOLOGY: I have personally reviewed the radiological images as listed and agreed with the  findings in the report.  CLINICAL DATA: Stage IV rectal cancer diagnosed in 2012 with surgery, chemotherapy, and radiation therapy. Pulmonary metastasis diagnosed 6/16. Wedge resection with VATS on 11/15/2014.  EXAM: CT CHEST, ABDOMEN, AND PELVIS WITH CONTRAST  TECHNIQUE: Multidetector CT imaging of the chest, abdomen and pelvis was performed following the standard protocol during bolus administration of intravenous contrast.  CONTRAST: 185m OMNIPAQUE IOHEXOL 300 MG/ML SOLN  COMPARISON: 09/25/2014 PET. Prior chest abdomen and pelvic CTs of 09/08/2014  FINDINGS: CT CHEST FINDINGS  Mediastinum/Nodes: No supraclavicular adenopathy. Borderline cardiomegaly, without pericardial effusion. Multivessel coronary artery atherosclerosis. No central pulmonary embolism, on this non-dedicated study. 11 mm subcarinal node is similar (image 31, series 2). This is upper normal size. No hilar adenopathy. 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  Tiny hiatal hernia.  Lungs/Pleura: No pleural fluid. Improved posterior right upper lobe reticular nodular opacity (image 20, series 3). Interval right middle lobe pulmonary nodule resection. Clear left lung, without new pulmonary nodule.  Musculoskeletal: No acute osseous abnormality.  CT ABDOMEN PELVIS FINDINGS  Hepatobiliary: Possible gallstone. Normal liver. No biliary duct dilatation.  Pancreas: Normal, without mass or ductal dilatation.  Spleen: Normal in size, without focal abnormality.  Adrenals/Urinary Tract: Left adrenal calcification is similar. Normal right adrenal gland.  Normal kidneys, without hydronephrosis. Apparent bladder wall irregularity (image 124, series 2)is favored to be due to underdistention.  Stomach/Bowel: Proximal gastric underdistention. Surgical sutures at the rectosigmoid junction. Normal terminal ileum and appendix. Normal small bowel caliber. Mid small bowel loops are  positioned anteriorly, suggesting adhesions. Small bowel is positioned just deep to an area of right lower quadrant wall irregularity, possibly a prior ostomy site (image 95, series 2).  Vascular/Lymphatic: Aortic and branch vessel atherosclerosis. IVC filter which is appropriately positioned. No abdominopelvic adenopathy.  Reproductive: Normal prostate.  Other: No significant free fluid. Fat containing left inguinal hernia is tiny. No evidence of omental or peritoneal disease.  Musculoskeletal: Degenerative disc disease at L5-S1.  IMPRESSION: 1. Interval resection of right middle lobe metastasis. No acute process or evidence of metastatic disease in the chest, abdomen or pelvis. 2. Improved right upper lobe reticular nodular opacities are favored to represent resolving infection. 3. Atherosclerosis, including within the coronary arteries. 4. Small bowel positioned anteriorly within the abdomen, possibly representing adhesions. Bowel is also positioned just deep to a presumed remote right lower quadrant ostomy site. No obstruction or other acute complication. 5. Possible cholelithiasis.   Electronically Signed  By: Abigail Miyamoto M.D.  On: 03/07/2015 12:52   ASSESSMENT AND PLAN:  Stage IV CRC s/p surgical resection of solitary pulmonary nodule on 11/16/2014 Bronchoscopy, R VATS, wedge resection RML 11/16/2014 with Dr. Servando Snare History of Stage III CRC Treatment related neuropathy History of IVC filter in 2012 Acute DVT/PE 09/2014, s/p catheter lysis of DVT XARELTO Anxiety on Lexapro Worsening GERD   I have refilled a 90 day supply of gabapentin with 3 refills at patient request.  I have written the patient a prescription for dexilant. If his insurance will not cover dexilant, I will prescribe protonix. I have also referred him to GI for an EGD.    Mr. Fernando will receive repeat imaging studies before his next follow up in clinic. He will return in 3 months  to discuss these results and repeat CBC, CMP, CEA.  All questions were answered. The patient knows to call the clinic with any problems, questions or concerns. We can certainly see the patient much sooner if necessary.   This document serves as a record of services personally performed by Ancil Linsey, MD. It was created on her behalf by Arlyce Harman, a trained medical scribe. The creation of this record is based on the scribe's personal observations and the provider's statements to them. This document has been checked and approved by the attending provider.  I have reviewed the above documentation for accuracy and completeness, and I agree with the above.  This note was electronically signed.   Kelby Fam. Whitney Muse, MD

## 2015-06-13 NOTE — Patient Instructions (Addendum)
[place] at [facility] Discharge Instructions  RECOMMENDATIONS MADE BY THE CONSULTANT AND ANY TEST RESULTS WILL BE SENT TO YOUR REFERRING PHYSICIAN.   Exam and discussion by Dr Whitney Muse today Refill on gabapentin 90 day supply Dexilant prescription  CT scans   Referral for GI, they will call you with this appt  Return to see the doctor in 3 months with labs Please call the clinic if you have any questions or concerns     Thank you for choosing [place] at [facility] to provide your oncology and hematology care.  To afford each patient quality time with our provider, please arrive at least 15 minutes before your scheduled appointment time.   Beginning January 23rd 2017 lab work for the [facility] will be done in the  Main lab at [facility] on 1st floor. If you have a lab appointment with the [facility] please come in thru the  Main Entrance and check in at the main information desk  You need to re-schedule your appointment should you arrive 10 or more minutes late.  We strive to give you quality time with our providers, and arriving late affects you and other patients whose appointments are after yours.  Also, if you no show three or more times for appointments you may be dismissed from the clinic at the providers discretion.     Again, thank you for choosing [facility].  Our hope is that these requests will decrease the amount of time that you wait before being seen by our physicians.       _____________________________________________________________  Should you have questions after your visit to [facility], please contact our office at [phone] between the hours of 8:30 a.m. and 4:30 p.m.  Voicemails left after 4:30 p.m. will not be returned until the following business day.  For prescription refill requests, have your pharmacy contact our office.

## 2015-06-26 ENCOUNTER — Ambulatory Visit: Payer: Medicare Other | Admitting: Internal Medicine

## 2015-07-10 ENCOUNTER — Other Ambulatory Visit: Payer: Self-pay

## 2015-07-10 ENCOUNTER — Ambulatory Visit (INDEPENDENT_AMBULATORY_CARE_PROVIDER_SITE_OTHER): Payer: Medicare Other | Admitting: Internal Medicine

## 2015-07-10 ENCOUNTER — Encounter: Payer: Self-pay | Admitting: Internal Medicine

## 2015-07-10 VITALS — BP 143/87 | HR 100 | Temp 98.0°F | Ht 74.0 in | Wt 221.6 lb

## 2015-07-10 DIAGNOSIS — C20 Malignant neoplasm of rectum: Secondary | ICD-10-CM

## 2015-07-10 DIAGNOSIS — K219 Gastro-esophageal reflux disease without esophagitis: Secondary | ICD-10-CM

## 2015-07-10 NOTE — Patient Instructions (Signed)
Schedule a diagnostic EGD - breakthrough GERD symptoms on a PPP  Pt to remain on Xarelto for examination  Continue prilosec 20 mg daily for now

## 2015-07-10 NOTE — Progress Notes (Signed)
Primary Care Physician:  [name], MD Primary Gastroenterologist:  Dr.   Pre-Procedure History & Physical: HPI:  Charles [name] is a 65 y.o. male here for evaluation of breakthrough reflux symptoms. Has been on Prilosec 20 mg daily for some time. Past few months he's noted increased gas, belching and heartburn symptoms. He has an almost daily in spite of taking the PPI daily. No dysphagia or odynophagia early satiety nausea or vomiting. He's had no melena or rectal bleeding. He does have a bad habit of eating late at night before going to bed. Last EGD several years ago without significant findings. History of rectal cancer with pulmonary metastases, pulmonary embolism for which he is on lifelong anticoagulation therapy. Last colonoscopy look good in 2015; he'll be due for surveillance examination 2018  Past Medical History  Diagnosis Date  . HTN (hypertension)   . Pulmonary embolism ([place])   . GERD (gastroesophageal reflux disease)   . Depression 04/01/2011  . DVT (deep venous thrombosis) ([place]) 05/09/2011  . High output ileostomy ([place]) 05/21/2011  . Colon cancer ([place]) 07/24/10    rectal ca, inv adenocarcinoma  . Hx of radiation therapy 09/02/10 to 10/14/10    pelvis  . Rectal cancer ([place]) 01/15/2011    S/P radiation and concurrent 5-FU continuous infusion from 09/09/10- 10/10/10.  S/P proctectomy with colorectal anastomosis and diverting loop ileostomy on 11/14/10 at [facility] by Dr. Harlon Ditty. Pathology reveals a pT3b N1 with 3/20 lymph nodes.    . Lung metastasis ([place])   . Peripheral vascular disease ([place])     dvt's,pe  . Pneumonia     hx x3  . Chronic anticoagulation     Past Surgical History  Procedure Laterality Date  . Esophagogastroduodenoscopy  07/2010    RMR: schatki ring s/p dilation, small hh, SB bx benign  . Colonoscopy  07/2010    proximal rectal apple core mass 10-14cm from anal verge (adenocarcinoma), 2-3cm distal rectal carpet polyp s/p piecemeal snare  polypectomy (adenoma)  . Eus  08/2010    Dr. Owens Loffler. uT3N0 circumferential, nearly obstruction rectosigmoid adenocarcinoma, distal edge 12cm from anal verge  . Ivc filter    . Colon surgery  11/14/2010    proctectomy with colorectal anastomosis and diverting loop ileostomy (temporary planned)  . Port a cath placement    . Colostomy reversal  april 2013  . Port-a-cath removal  09/24/2011    Procedure: REMOVAL PORT-A-CATH;  Surgeon: Donato Heinz, MD;  Location: [facility];  Service: General;  Laterality: N/A;  Minor Room  . Ivc filter    . Colonoscopy  04/21/2012    RMR: Friable,fibrotic appearing colorectal anastomosis producing some luminal narrowing-not felt to be critical. path: focal erosion with slight inflammation and hyperemia. SURVEILLANCE DUE DEC 2015  . Colonoscopy N/A 11/24/2013    Dr. Jaana Brodt:somewhat fibrotic/friable anastomotic mucosal-status post biopsy (narrowing not felt to be clinically significant) Single colonic diverticulum. benign polypoid rectal mucosa  . Hernia repair      abd hernia repair  . Video bronchoscopy N/A 11/15/2014    Procedure: VIDEO BRONCHOSCOPY;  Surgeon: [name], MD;  Location: [facility];  Service: Thoracic;  Laterality: N/A;  . Video assisted thoracoscopy (vats)/wedge resection Right 11/15/2014    Procedure: VIDEO ASSISTED THORACOSCOPY (VATS)/LUNG RESECTION WITH RIGHT LINGULECTOMY;  Surgeon: [name], MD;  Location: [place];  Service: Thoracic;  Laterality: Right;    Prior to Admission medications   Medication Sig Start Date End Date Taking?  Authorizing Provider  clonazePAM (KLONOPIN) 0.5 MG tablet Take 0.5 mg by mouth at bedtime.    Yes Historical Provider, MD  diphenhydramine-acetaminophen (TYLENOL PM) 25-500 MG TABS Take 1 tablet by mouth at bedtime as needed (sleep).    Yes Historical Provider, MD  gabapentin (NEURONTIN) 300 MG capsule Take 3 capsules in the am, one at lunch and three at bedtime. Increase every 5 days until on 3  capsules tid 06/13/15  Yes [name], MD  omeprazole (PRILOSEC OTC) 20 MG tablet Take 20 mg by mouth daily.   Yes Historical Provider, MD  psyllium (METAMUCIL) 58.6 % powder Take 1 packet by mouth 2 (two) times daily.    Yes Historical Provider, MD  rivaroxaban (XARELTO) 20 MG TABS tablet Take 1 tablet (20 mg total) by mouth daily with supper. 11/18/14  Yes Erin R Barrett, PA-C  tamsulosin (FLOMAX) 0.4 MG CAPS capsule Take 1 capsule (0.4 mg total) by mouth daily. 09/15/14  Yes Donita Brooks, NP  vitamin B-12 (CYANOCOBALAMIN) 1000 MCG tablet Take 1,000 mcg by mouth daily.   Yes Historical Provider, MD  vitamin E 400 UNIT capsule Take 400 Units by mouth daily.   Yes Historical Provider, MD  ALPRAZolam (XANAX) 0.25 MG tablet Take 1 tablet (0.25 mg total) by mouth 3 (three) times daily as needed for anxiety. 11/13/14   [name], MD  dexlansoprazole (DEXILANT) 60 MG capsule Take 1 capsule (60 mg total) by mouth daily. Patient not taking: Reported on 07/10/2015 06/13/15   [name], MD    Allergies as of 07/10/2015 - Review Complete 07/10/2015  Allergen Reaction Noted  . Oxycodone  11/23/2014  . Tramadol  11/23/2014  . Trazodone and nefazodone Other (See Comments) 09/08/2014    Family History  Problem Relation Age of Onset  . Colon cancer Neg Hx   . Liver disease Neg Hx   . Inflammatory bowel disease Neg Hx   . Cancer Brother     throat  . Cancer Brother     prostate    Social History   Social History  . Marital Status: Married    Spouse Name: N/A  . Number of Children: 2  . Years of Education: N/A   Occupational History  . self-employed Regulatory affairs officer, currently not working    Social History Main Topics  . Smoking status: Never Smoker   . Smokeless tobacco: Never Used  . Alcohol Use: No  . Drug Use: No  . Sexual Activity: Not on file   Other Topics Concern  . Not on file   Social History Narrative    Review of Systems: See HPI,  otherwise negative ROS  Physical Exam: BP 143/87 mmHg  Pulse 100  Temp(Src) 98 F (36.7 C) (Oral)  Ht '6\' 2"'$  (1.88 m)  Wt 221 lb 9.6 oz (100.517 kg)  BMI 28.44 kg/m2 General:  , well-nourished, pleasant and cooperative in NAD Neck:  Supple; no masses or thyromegaly. No significant cervical adenopathy. Lungs:  Clear throughout to auscultation.   No wheezes, crackles, or rhonchi. No acute distress. Heart:  Regular rate and rhythm; no murmurs, clicks, rubs,  or gallops. Abdomen: Non-distended, normal bowel sounds.  Soft and nontender without appreciable mass or hepatosplenomegaly.  Pulses:  Normal pulses noted. Extremities:  Without clubbing or edema.  Impression:   Pleasant 65 year old gentleman with a history of metastatic rectal cancer complicated by recurrent pulmonary emboli on lifelong anticoagulation therapy presents with breakthrough reflux symptoms in spite of daily PPI  therapy. Some breakthrough may be related to dietary indiscretion. No history of bleeding or dysphagia. It's been some time since he last had his upper GI tract evaluated. I have offered the patient a diagnostic EGD in near future.The risks, benefits, limitations, alternatives and imponderables have been reviewed with the patient. Questions have been answered. All parties are agreeable.  I will allow him to remain on anticoagulation therapy for the procedure as the benefits outweigh the risks.   As a separate issue, his last colonoscopy was in 2015. I would plan for his next surveillance colonoscopy to occur in 2018   Recommendations:  Schedule a diagnostic EGD - breakthrough GERD symptoms on a PPI.  The risks, benefits, limitations, alternatives and imponderables have been reviewed with the patient. Potential for esophageal dilation, biopsy, etc. have also been reviewed.  Questions have been answered. All parties agreeable.  Pt to remain on Xarelto for examination  Continue prilosec 20 mg daily for now  Notice:  This dictation was prepared with Dragon dictation along with smaller phrase technology. Any transcriptional errors that result from this process are unintentional and may not be corrected upon review.

## 2015-08-06 ENCOUNTER — Telehealth: Payer: Self-pay

## 2015-08-06 NOTE — Telephone Encounter (Signed)
Pt would like to wait until the first of May due to his insurance. They are aware that he will need another office visit.

## 2015-08-08 ENCOUNTER — Encounter (HOSPITAL_COMMUNITY): Admission: RE | Payer: Self-pay | Source: Ambulatory Visit

## 2015-08-08 ENCOUNTER — Ambulatory Visit (HOSPITAL_COMMUNITY): Admission: RE | Admit: 2015-08-08 | Payer: Medicare Other | Source: Ambulatory Visit | Admitting: Internal Medicine

## 2015-08-08 SURGERY — EGD (ESOPHAGOGASTRODUODENOSCOPY)
Anesthesia: Moderate Sedation

## 2015-08-30 ENCOUNTER — Other Ambulatory Visit: Payer: Self-pay

## 2015-08-30 ENCOUNTER — Encounter: Payer: Self-pay | Admitting: Nurse Practitioner

## 2015-08-30 ENCOUNTER — Ambulatory Visit (INDEPENDENT_AMBULATORY_CARE_PROVIDER_SITE_OTHER): Payer: Medicare Other | Admitting: Nurse Practitioner

## 2015-08-30 VITALS — BP 167/89 | HR 73 | Temp 97.1°F | Ht 74.0 in | Wt 222.8 lb

## 2015-08-30 DIAGNOSIS — K219 Gastro-esophageal reflux disease without esophagitis: Secondary | ICD-10-CM | POA: Diagnosis not present

## 2015-08-30 NOTE — Assessment & Plan Note (Signed)
Continues with persistent breakthrough GERD symptoms on Prilosec 20 mg daily. As previously recommended we will schedule him for an endoscopy. He is on Xarelto and will remain on this for the exam. Return for follow-up in 2 months for post procedure follow-up and to evaluate effectiveness of any medication changes.  Proceed with EGD with Dr. [name] in near future: the risks, benefits, and alternatives have been discussed with the patient in detail. The patient states understanding and desires to proceed.  The patient is on Xarelto for history of pulmonary embolism. He is also on Xanax 0.25 mg as needed. No other anticoagulants, anxiolytics, chronic pain medications, or antidepressants. Conscious sedation should be adequate for his procedure.

## 2015-08-30 NOTE — Progress Notes (Signed)
Referring Provider: [name], MD Primary Care Physician:  [name], MD Primary GI:  Dr. [name]  Chief Complaint  Patient presents with  . set up EGD    HPI:   Charles [name] is a 65 y.o. male who presents to schedule EGD. Last seen in our office 07/10/15 for breakthrough GERD symptoms. At that time it was questionable whether his dietary indiscretion versus worsening GERD on daily PPI. However, it was elected to proceed with endoscopy. Of note he is on lifelong Xarelto anticoagulation for pulmonary embolism and it was plan to leave him on Xarelto for the examination. He had to cancel his endoscopy initially scheduled for 08/08/2015 and reschedule for after May 1 due to insurance change.  Today he states hsi symptoms are unchanged from last visit. Continues with bdaily breakthrough GERD symptoms, no dysphagia or odynophagia. Deneis abdominal pain, N/V, hematochezia, melena, chagres in bowel habits. Is on recall list for surveilance TCS for 12/2016. Denies chest pain, dyspnea, dizziness, lightheadedness, syncope, near syncope. Denies any other upper or lower GI symptoms.  Past Medical History  Diagnosis Date  . HTN (hypertension)   . Pulmonary embolism ([place])   . GERD (gastroesophageal reflux disease)   . Depression 04/01/2011  . DVT (deep venous thrombosis) ([place]) 05/09/2011  . High output ileostomy ([place]) 05/21/2011  . Colon cancer ([place]) 07/24/10    rectal ca, inv adenocarcinoma  . Hx of radiation therapy 09/02/10 to 10/14/10    pelvis  . Rectal cancer ([place]) 01/15/2011    S/P radiation and concurrent 5-FU continuous infusion from 09/09/10- 10/10/10.  S/P proctectomy with colorectal anastomosis and diverting loop ileostomy on 11/14/10 at [facility] by Dr. Harlon Ditty. Pathology reveals a pT3b N1 with 3/20 lymph nodes.    . Lung metastasis ([place])   . Peripheral vascular disease ([place])     dvt's,pe  . Pneumonia     hx x3  . Chronic anticoagulation     Past Surgical History  Procedure  Laterality Date  . Esophagogastroduodenoscopy  07/2010    RMR: schatki ring s/p dilation, small hh, SB bx benign  . Colonoscopy  07/2010    proximal rectal apple core mass 10-14cm from anal verge (adenocarcinoma), 2-3cm distal rectal carpet polyp s/p piecemeal snare polypectomy (adenoma)  . Eus  08/2010    Dr. Owens Loffler. uT3N0 circumferential, nearly obstruction rectosigmoid adenocarcinoma, distal edge 12cm from anal verge  . Ivc filter    . Colon surgery  11/14/2010    proctectomy with colorectal anastomosis and diverting loop ileostomy (temporary planned)  . Port a cath placement    . Colostomy reversal  april 2013  . Port-a-cath removal  09/24/2011    Procedure: REMOVAL PORT-A-CATH;  Surgeon: Donato Heinz, MD;  Location: [facility];  Service: General;  Laterality: N/A;  Minor Room  . Ivc filter    . Colonoscopy  04/21/2012    RMR: Friable,fibrotic appearing colorectal anastomosis producing some luminal narrowing-not felt to be critical. path: focal erosion with slight inflammation and hyperemia. SURVEILLANCE DUE DEC 2015  . Colonoscopy N/A 11/24/2013    Dr. Rourk:somewhat fibrotic/friable anastomotic mucosal-status post biopsy (narrowing not felt to be clinically significant) Single colonic diverticulum. benign polypoid rectal mucosa  . Hernia repair      abd hernia repair  . Video bronchoscopy N/A 11/15/2014    Procedure: VIDEO BRONCHOSCOPY;  Surgeon: [name], MD;  Location: [facility] OR;  Service: Thoracic;  Laterality: N/A;  . Video assisted thoracoscopy (  vats)/wedge resection Right 11/15/2014    Procedure: VIDEO ASSISTED THORACOSCOPY (VATS)/LUNG RESECTION WITH RIGHT LINGULECTOMY;  Surgeon: [name], MD;  Location: [place];  Service: Thoracic;  Laterality: Right;    Current Outpatient Prescriptions  Medication Sig Dispense Refill  . ALPRAZolam (XANAX) 0.25 MG tablet Take 1 tablet (0.25 mg total) by mouth 3 (three) times daily as needed for anxiety. 45 tablet 0  . clonazePAM  (KLONOPIN) 0.5 MG tablet Take 0.5 mg by mouth at bedtime.     . diphenhydramine-acetaminophen (TYLENOL PM) 25-500 MG TABS Take 1 tablet by mouth at bedtime as needed (sleep).     . gabapentin (NEURONTIN) 300 MG capsule Take 3 capsules in the am, one at lunch and three at bedtime. Increase every 5 days until on 3 capsules tid 540 capsule 3  . omeprazole (PRILOSEC OTC) 20 MG tablet Take 20 mg by mouth daily.    . psyllium (METAMUCIL) 58.6 % powder Take 1 packet by mouth 2 (two) times daily.     . rivaroxaban (XARELTO) 20 MG TABS tablet Take 1 tablet (20 mg total) by mouth daily with supper. 30 tablet 3  . tamsulosin (FLOMAX) 0.4 MG CAPS capsule Take 1 capsule (0.4 mg total) by mouth daily. 30 capsule 3  . vitamin B-12 (CYANOCOBALAMIN) 1000 MCG tablet Take 1,000 mcg by mouth daily.    . vitamin E 400 UNIT capsule Take 400 Units by mouth daily.     No current facility-administered medications for this visit.   Facility-Administered Medications Ordered in Other Visits  Medication Dose Route Frequency Provider Last Rate Last Dose  . sodium chloride 0.9 % injection 10 mL  10 mL Intravenous PRN Everardo All, MD   10 mL at 08/22/11 1114    Allergies as of 08/30/2015 - Review Complete 08/30/2015  Allergen Reaction Noted  . Oxycodone  11/23/2014  . Tramadol  11/23/2014  . Trazodone and nefazodone Other (See Comments) 09/08/2014    Family History  Problem Relation Age of Onset  . Colon cancer Neg Hx   . Liver disease Neg Hx   . Inflammatory bowel disease Neg Hx   . Cancer Brother     throat  . Cancer Brother     prostate    Social History   Social History  . Marital Status: Married    Spouse Name: N/A  . Number of Children: 2  . Years of Education: N/A   Occupational History  . self-employed Regulatory affairs officer, currently not working    Social History Main Topics  . Smoking status: Never Smoker   . Smokeless tobacco: Never Used  . Alcohol Use: No  . Drug Use: No  .  Sexual Activity: Not Asked   Other Topics Concern  . None   Social History Narrative    Review of Systems: General: Negative for anorexia, weight loss, fever, chills, fatigue, weakness. ENT: Negative for hoarseness, difficulty swallowing. CV: Negative for chest pain, angina, palpitations, peripheral edema.  Respiratory: Negative for dyspnea at rest, cough, sputum, wheezing.  GI: See history of present illness. Endo: Negative for unusual weight change.    Physical Exam: BP 167/89 mmHg  Pulse 73  Temp(Src) 97.1 F (36.2 C)  Ht '6\' 2"'$  (1.88 m)  Wt 222 lb 12.8 oz (101.061 kg)  BMI 28.59 kg/m2 General:   Alert and oriented. Pleasant and cooperative. Well-nourished and well-developed.  Head:  Normocephalic and atraumatic. Cardiovascular:  S1, S2 present without murmurs appreciated. Extremities without clubbing or edema. Respiratory:  Clear to auscultation bilaterally. No wheezes, rales, or rhonchi. No distress.  Gastrointestinal:  +BS, soft, non-tender and non-distended. No HSM noted. No guarding or rebound. No masses appreciated.  Rectal:  Deferred  Neurologic:  Alert and oriented x4;  grossly normal neurologically. Psych:  Alert and cooperative. Normal mood and affect. Heme/Lymph/Immune: No excessive bruising noted.    08/30/2015 8:35 AM   Disclaimer: This note was dictated with voice recognition software. Similar sounding words can inadvertently be transcribed and may not be corrected upon review.

## 2015-08-30 NOTE — Progress Notes (Signed)
cc'ed to pcp °

## 2015-08-30 NOTE — Patient Instructions (Signed)
1. We'll schedule your procedure for you. 2. Keep taking her Xarelto. 3. Keep taking her Protonix. 4. Return for follow-up in 2-3 months for post procedure visit. Based on results your procedure Dr. [name] may change this follow-up time

## 2015-09-04 ENCOUNTER — Encounter (HOSPITAL_COMMUNITY)
Admission: RE | Disposition: A | Discharge status: Home / Self Care | Payer: Self-pay | Source: Ambulatory Visit | Attending: Internal Medicine

## 2015-09-04 ENCOUNTER — Ambulatory Visit (HOSPITAL_COMMUNITY)
Admission: RE | Admit: 2015-09-04 | Discharge: 2015-09-04 | Disposition: A | Discharge status: Home / Self Care | Payer: Medicare Other | Source: Ambulatory Visit | Attending: Internal Medicine | Admitting: Internal Medicine

## 2015-09-04 ENCOUNTER — Encounter (HOSPITAL_COMMUNITY): Payer: Self-pay | Admitting: *Deleted

## 2015-09-04 DIAGNOSIS — R12 Heartburn: Secondary | ICD-10-CM | POA: Insufficient documentation

## 2015-09-04 DIAGNOSIS — Z79899 Other long term (current) drug therapy: Secondary | ICD-10-CM | POA: Insufficient documentation

## 2015-09-04 DIAGNOSIS — Z85038 Personal history of other malignant neoplasm of large intestine: Secondary | ICD-10-CM | POA: Diagnosis not present

## 2015-09-04 DIAGNOSIS — Z7901 Long term (current) use of anticoagulants: Secondary | ICD-10-CM | POA: Insufficient documentation

## 2015-09-04 DIAGNOSIS — Z808 Family history of malignant neoplasm of other organs or systems: Secondary | ICD-10-CM | POA: Insufficient documentation

## 2015-09-04 DIAGNOSIS — K222 Esophageal obstruction: Secondary | ICD-10-CM | POA: Insufficient documentation

## 2015-09-04 DIAGNOSIS — K449 Diaphragmatic hernia without obstruction or gangrene: Secondary | ICD-10-CM | POA: Diagnosis not present

## 2015-09-04 DIAGNOSIS — Z86711 Personal history of pulmonary embolism: Secondary | ICD-10-CM | POA: Diagnosis not present

## 2015-09-04 DIAGNOSIS — Z8042 Family history of malignant neoplasm of prostate: Secondary | ICD-10-CM | POA: Diagnosis not present

## 2015-09-04 DIAGNOSIS — Z9049 Acquired absence of other specified parts of digestive tract: Secondary | ICD-10-CM | POA: Insufficient documentation

## 2015-09-04 DIAGNOSIS — F329 Major depressive disorder, single episode, unspecified: Secondary | ICD-10-CM | POA: Insufficient documentation

## 2015-09-04 DIAGNOSIS — C7800 Secondary malignant neoplasm of unspecified lung: Secondary | ICD-10-CM | POA: Diagnosis not present

## 2015-09-04 DIAGNOSIS — I10 Essential (primary) hypertension: Secondary | ICD-10-CM | POA: Insufficient documentation

## 2015-09-04 DIAGNOSIS — Z86718 Personal history of other venous thrombosis and embolism: Secondary | ICD-10-CM | POA: Diagnosis not present

## 2015-09-04 DIAGNOSIS — Q394 Esophageal web: Secondary | ICD-10-CM | POA: Diagnosis not present

## 2015-09-04 DIAGNOSIS — K219 Gastro-esophageal reflux disease without esophagitis: Secondary | ICD-10-CM | POA: Insufficient documentation

## 2015-09-04 HISTORY — PX: ESOPHAGOGASTRODUODENOSCOPY: SHX5428

## 2015-09-04 SURGERY — EGD (ESOPHAGOGASTRODUODENOSCOPY)
Anesthesia: Moderate Sedation

## 2015-09-04 MED ORDER — ONDANSETRON HCL 4 MG/2ML IJ SOLN
INTRAMUSCULAR | Status: DC | PRN
  Administered 2015-09-04: 4 mg via INTRAVENOUS

## 2015-09-04 MED ORDER — LIDOCAINE VISCOUS 2 % MT SOLN
OROMUCOSAL | Status: DC | PRN
  Administered 2015-09-04: 3 mL via OROMUCOSAL

## 2015-09-04 MED ORDER — MIDAZOLAM HCL 5 MG/5ML IJ SOLN
INTRAMUSCULAR | Status: DC | PRN
  Administered 2015-09-04: 1 mg via INTRAVENOUS
  Administered 2015-09-04 (×2): 2 mg via INTRAVENOUS

## 2015-09-04 MED ORDER — MIDAZOLAM HCL 5 MG/5ML IJ SOLN
INTRAMUSCULAR | Status: AC
  Filled 2015-09-04: qty 10

## 2015-09-04 MED ORDER — LIDOCAINE VISCOUS 2 % MT SOLN
OROMUCOSAL | Status: AC
  Filled 2015-09-04: qty 15

## 2015-09-04 MED ORDER — ONDANSETRON HCL 4 MG/2ML IJ SOLN
INTRAMUSCULAR | Status: AC
  Filled 2015-09-04: qty 2

## 2015-09-04 MED ORDER — MEPERIDINE HCL 100 MG/ML IJ SOLN
INTRAMUSCULAR | Status: AC
  Filled 2015-09-04: qty 2

## 2015-09-04 MED ORDER — SODIUM CHLORIDE 0.9 % IV SOLN
INTRAVENOUS | Status: DC
  Administered 2015-09-04: 07:00:00 via INTRAVENOUS

## 2015-09-04 MED ORDER — MEPERIDINE HCL 100 MG/ML IJ SOLN
INTRAMUSCULAR | Status: DC | PRN
  Administered 2015-09-04 (×2): 25 mg via INTRAVENOUS
  Administered 2015-09-04: 50 mg via INTRAVENOUS

## 2015-09-04 MED ORDER — STERILE WATER FOR IRRIGATION IR SOLN
Status: DC | PRN
  Administered 2015-09-04: 08:00:00

## 2015-09-04 NOTE — Op Note (Signed)
[facility] Patient Name: Charles [name] Procedure Date: 09/04/2015 7:30 AM MRN: [id number] Date of Birth: 01/11/1951 Attending MD: Norvel Richards , MD CSN: [id number] Age: 65 Admit Type: Outpatient Procedure:                Upper GI endoscopy Indications:              Heartburn - breakthrough symptoms on daily                            omeprazole. Providers:                Norvel Richards, MD, Gwenlyn Fudge, RN, Isabella Stalling, Technician Referring MD:              Medicines:                Midazolam 5 mg IV, Meperidine 100 mg IV,                            Ondansetron 4 mg IV Complications:            No immediate complications. Estimated Blood Loss:     Estimated blood loss: none. Procedure:                Pre-Anesthesia Assessment:                           - Prior to the procedure, a History and Physical                            was performed, and patient medications and                            allergies were reviewed. The patient's tolerance of                            previous anesthesia was also reviewed. The risks                            and benefits of the procedure and the sedation                            options and risks were discussed with the patient.                            All questions were answered, and informed consent                            was obtained. Prior Anticoagulants: The patient                            last took Xarelto (rivaroxaban) 1 day prior to the  procedure. ASA Grade Assessment: II - A patient                            with mild systemic disease. After reviewing the                            risks and benefits, the patient was deemed in                            satisfactory condition to undergo the procedure.                           After obtaining informed consent, the endoscope was                            passed under direct vision. Throughout  the                            procedure, the patient's blood pressure, pulse, and                            oxygen saturations were monitored continuously. The                            EG-[id number] ([id number]) scope was introduced through the                            mouth, and advanced to the second part of duodenum.                            The upper GI endoscopy was accomplished without                            difficulty. The patient tolerated the procedure                            well. Scope In: 7:45:50 AM Scope Out: 7:49:41 AM Total Procedure Duration: 0 hours 3 minutes 51 seconds  Findings:      A non-obstructing Schatzki ring (acquired) was found at the       gastroesophageal junction.      The exam was otherwise without abnormality.      A small hiatal hernia was present. Stomach is empty.      The exam was otherwise without abnormality.      The second portion of the duodenum was normal. Estimated blood loss:       none. Impression:               - Non-obstructing Schatzki ring.                           - The examination was otherwise normal.                           - Small hiatal hernia.                           -  The examination was otherwise normal.                           - Normal second portion of the duodenum.                           - No specimens collected. Moderate Sedation:      Moderate (conscious) sedation was administered by the endoscopy nurse       and supervised by the endoscopist. The following parameters were       monitored: oxygen saturation, heart rate, blood pressure, respiratory       rate, EKG, adequacy of pulmonary ventilation, and response to care.       Total physician intraservice time was 9 minutes. Recommendation:           - Patient has a contact number available for                            emergencies. The signs and symptoms of potential                            delayed complications were discussed with the                             patient. Return to normal activities tomorrow.                            Written discharge instructions were provided to the                            patient.                           - Advance diet as tolerated.                           - Continue present medications except omeprazole;                            begin Dexilant 60 mg daily, patient to go by my                            office for a supply of free samples.. Prescription                            also provided.                           - Return to GI office in 3 months. Procedure Code(s):        --- Professional ---                           [phone], Esophagogastroduodenoscopy, flexible,                            transoral; diagnostic, including collection of  specimen(s) by brushing or washing, when performed                            (separate procedure) Diagnosis Code(s):        --- Professional ---                           K22.2, Esophageal obstruction                           K44.9, Diaphragmatic hernia without obstruction or                            gangrene                           R12, Heartburn CPT copyright 2016 [employer]. All rights reserved. The codes documented in this report are preliminary and upon coder review may  be revised to meet current compliance requirements. Cristopher Estimable. Rourk, MD Norvel Richards, MD 09/04/2015 8:00:04 AM This report has been signed electronically. Number of Addenda: 0

## 2015-09-04 NOTE — H&P (View-Only) (Signed)
Referring Provider: [name], MD Primary Care Physician:  [name], MD Primary GI:  Dr. [name]  Chief Complaint  Patient presents with  . set up EGD    HPI:   Charles [name] is a 65 y.o. male who presents to schedule EGD. Last seen in our office 07/10/15 for breakthrough GERD symptoms. At that time it was questionable whether his dietary indiscretion versus worsening GERD on daily PPI. However, it was elected to proceed with endoscopy. Of note he is on lifelong Xarelto anticoagulation for pulmonary embolism and it was plan to leave him on Xarelto for the examination. He had to cancel his endoscopy initially scheduled for 08/08/2015 and reschedule for after May 1 due to insurance change.  Today he states hsi symptoms are unchanged from last visit. Continues with bdaily breakthrough GERD symptoms, no dysphagia or odynophagia. Deneis abdominal pain, N/V, hematochezia, melena, chagres in bowel habits. Is on recall list for surveilance TCS for 12/2016. Denies chest pain, dyspnea, dizziness, lightheadedness, syncope, near syncope. Denies any other upper or lower GI symptoms.  Past Medical History  Diagnosis Date  . HTN (hypertension)   . Pulmonary embolism ([place])   . GERD (gastroesophageal reflux disease)   . Depression 04/01/2011  . DVT (deep venous thrombosis) ([place]) 05/09/2011  . High output ileostomy ([place]) 05/21/2011  . Colon cancer ([place]) 07/24/10    rectal ca, inv adenocarcinoma  . Hx of radiation therapy 09/02/10 to 10/14/10    pelvis  . Rectal cancer ([place]) 01/15/2011    S/P radiation and concurrent 5-FU continuous infusion from 09/09/10- 10/10/10.  S/P proctectomy with colorectal anastomosis and diverting loop ileostomy on 11/14/10 at [facility] by Dr. Harlon Ditty. Pathology reveals a pT3b N1 with 3/20 lymph nodes.    . Lung metastasis ([place])   . Peripheral vascular disease ([place])     dvt's,pe  . Pneumonia     hx x3  . Chronic anticoagulation     Past Surgical History  Procedure  Laterality Date  . Esophagogastroduodenoscopy  07/2010    RMR: schatki ring s/p dilation, small hh, SB bx benign  . Colonoscopy  07/2010    proximal rectal apple core mass 10-14cm from anal verge (adenocarcinoma), 2-3cm distal rectal carpet polyp s/p piecemeal snare polypectomy (adenoma)  . Eus  08/2010    Dr. Owens Loffler. uT3N0 circumferential, nearly obstruction rectosigmoid adenocarcinoma, distal edge 12cm from anal verge  . Ivc filter    . Colon surgery  11/14/2010    proctectomy with colorectal anastomosis and diverting loop ileostomy (temporary planned)  . Port a cath placement    . Colostomy reversal  april 2013  . Port-a-cath removal  09/24/2011    Procedure: REMOVAL PORT-A-CATH;  Surgeon: Donato Heinz, MD;  Location: [facility];  Service: General;  Laterality: N/A;  Minor Room  . Ivc filter    . Colonoscopy  04/21/2012    RMR: Friable,fibrotic appearing colorectal anastomosis producing some luminal narrowing-not felt to be critical. path: focal erosion with slight inflammation and hyperemia. SURVEILLANCE DUE DEC 2015  . Colonoscopy N/A 11/24/2013    Dr. Rourk:somewhat fibrotic/friable anastomotic mucosal-status post biopsy (narrowing not felt to be clinically significant) Single colonic diverticulum. benign polypoid rectal mucosa  . Hernia repair      abd hernia repair  . Video bronchoscopy N/A 11/15/2014    Procedure: VIDEO BRONCHOSCOPY;  Surgeon: [name], MD;  Location: [facility] OR;  Service: Thoracic;  Laterality: N/A;  . Video assisted thoracoscopy (  vats)/wedge resection Right 11/15/2014    Procedure: VIDEO ASSISTED THORACOSCOPY (VATS)/LUNG RESECTION WITH RIGHT LINGULECTOMY;  Surgeon: [name], MD;  Location: [place];  Service: Thoracic;  Laterality: Right;    Current Outpatient Prescriptions  Medication Sig Dispense Refill  . ALPRAZolam (XANAX) 0.25 MG tablet Take 1 tablet (0.25 mg total) by mouth 3 (three) times daily as needed for anxiety. 45 tablet 0  . clonazePAM  (KLONOPIN) 0.5 MG tablet Take 0.5 mg by mouth at bedtime.     . diphenhydramine-acetaminophen (TYLENOL PM) 25-500 MG TABS Take 1 tablet by mouth at bedtime as needed (sleep).     . gabapentin (NEURONTIN) 300 MG capsule Take 3 capsules in the am, one at lunch and three at bedtime. Increase every 5 days until on 3 capsules tid 540 capsule 3  . omeprazole (PRILOSEC OTC) 20 MG tablet Take 20 mg by mouth daily.    . psyllium (METAMUCIL) 58.6 % powder Take 1 packet by mouth 2 (two) times daily.     . rivaroxaban (XARELTO) 20 MG TABS tablet Take 1 tablet (20 mg total) by mouth daily with supper. 30 tablet 3  . tamsulosin (FLOMAX) 0.4 MG CAPS capsule Take 1 capsule (0.4 mg total) by mouth daily. 30 capsule 3  . vitamin B-12 (CYANOCOBALAMIN) 1000 MCG tablet Take 1,000 mcg by mouth daily.    . vitamin E 400 UNIT capsule Take 400 Units by mouth daily.     No current facility-administered medications for this visit.   Facility-Administered Medications Ordered in Other Visits  Medication Dose Route Frequency Provider Last Rate Last Dose  . sodium chloride 0.9 % injection 10 mL  10 mL Intravenous PRN Everardo All, MD   10 mL at 08/22/11 1114    Allergies as of 08/30/2015 - Review Complete 08/30/2015  Allergen Reaction Noted  . Oxycodone  11/23/2014  . Tramadol  11/23/2014  . Trazodone and nefazodone Other (See Comments) 09/08/2014    Family History  Problem Relation Age of Onset  . Colon cancer Neg Hx   . Liver disease Neg Hx   . Inflammatory bowel disease Neg Hx   . Cancer Brother     throat  . Cancer Brother     prostate    Social History   Social History  . Marital Status: Married    Spouse Name: N/A  . Number of Children: 2  . Years of Education: N/A   Occupational History  . self-employed Regulatory affairs officer, currently not working    Social History Main Topics  . Smoking status: Never Smoker   . Smokeless tobacco: Never Used  . Alcohol Use: No  . Drug Use: No  .  Sexual Activity: Not Asked   Other Topics Concern  . None   Social History Narrative    Review of Systems: General: Negative for anorexia, weight loss, fever, chills, fatigue, weakness. ENT: Negative for hoarseness, difficulty swallowing. CV: Negative for chest pain, angina, palpitations, peripheral edema.  Respiratory: Negative for dyspnea at rest, cough, sputum, wheezing.  GI: See history of present illness. Endo: Negative for unusual weight change.    Physical Exam: BP 167/89 mmHg  Pulse 73  Temp(Src) 97.1 F (36.2 C)  Ht '6\' 2"'$  (1.88 m)  Wt 222 lb 12.8 oz (101.061 kg)  BMI 28.59 kg/m2 General:   Alert and oriented. Pleasant and cooperative. Well-nourished and well-developed.  Head:  Normocephalic and atraumatic. Cardiovascular:  S1, S2 present without murmurs appreciated. Extremities without clubbing or edema. Respiratory:  Clear to auscultation bilaterally. No wheezes, rales, or rhonchi. No distress.  Gastrointestinal:  +BS, soft, non-tender and non-distended. No HSM noted. No guarding or rebound. No masses appreciated.  Rectal:  Deferred  Neurologic:  Alert and oriented x4;  grossly normal neurologically. Psych:  Alert and cooperative. Normal mood and affect. Heme/Lymph/Immune: No excessive bruising noted.    08/30/2015 8:35 AM   Disclaimer: This note was dictated with voice recognition software. Similar sounding words can inadvertently be transcribed and may not be corrected upon review.

## 2015-09-04 NOTE — Discharge Instructions (Signed)
EGD Discharge instructions Please read the instructions outlined below and refer to this sheet in the next few weeks. These discharge instructions provide you with general information on caring for yourself after you leave the hospital. Your doctor may also give you specific instructions. While your treatment has been planned according to the most current medical practices available, unavoidable complications occasionally occur. If you have any problems or questions after discharge, please call your doctor. ACTIVITY  You may resume your regular activity but move at a slower pace for the next 24 hours.   Take frequent rest periods for the next 24 hours.   Walking will help expel (get rid of) the air and reduce the bloated feeling in your abdomen.   No driving for 24 hours (because of the anesthesia (medicine) used during the test).   You may shower.   Do not sign any important legal documents or operate any machinery for 24 hours (because of the anesthesia used during the test).  NUTRITION  Drink plenty of fluids.   You may resume your normal diet.   Begin with a light meal and progress to your normal diet.   Avoid alcoholic beverages for 24 hours or as instructed by your caregiver.  MEDICATIONS  You may resume your normal medications unless your caregiver tells you otherwise.  WHAT YOU CAN EXPECT TODAY  You may experience abdominal discomfort such as a feeling of fullness or gas pains.  FOLLOW-UP  Your doctor will discuss the results of your test with you.  SEEK IMMEDIATE MEDICAL ATTENTION IF ANY OF THE FOLLOWING OCCUR:  Excessive nausea (feeling sick to your stomach) and/or vomiting.   Severe abdominal pain and distention (swelling).   Trouble swallowing.   Temperature over 101 F (37.8 C).   Rectal bleeding or vomiting of blood.    GERD information provided  Stop omeprazole; begin Dexilant 60 mg daily-prescription provided. Patient may go by the office for 3 week  supply of free samples  Office visit with [place] in 3 months  Gastroesophageal Reflux Disease, Adult Normally, food travels down the esophagus and stays in the stomach to be digested. However, when a person has gastroesophageal reflux disease (GERD), food and stomach acid move back up into the esophagus. When this happens, the esophagus becomes sore and inflamed. Over time, GERD can create small holes (ulcers) in the lining of the esophagus.  CAUSES This condition is caused by a problem with the muscle between the esophagus and the stomach (lower esophageal sphincter, or LES). Normally, the LES muscle closes after food passes through the esophagus to the stomach. When the LES is weakened or abnormal, it does not close properly, and that allows food and stomach acid to go back up into the esophagus. The LES can be weakened by certain dietary substances, medicines, and medical conditions, including:  Tobacco use.  Pregnancy.  Having a hiatal hernia.  Heavy alcohol use.  Certain foods and beverages, such as coffee, chocolate, onions, and peppermint. RISK FACTORS This condition is more likely to develop in:  People who have an increased body weight.  People who have connective tissue disorders.  People who use NSAID medicines. SYMPTOMS Symptoms of this condition include:  Heartburn.  Difficult or painful swallowing.  The feeling of having a lump in the throat.  Abitter taste in the mouth.  Bad breath.  Having a large amount of saliva.  Having an upset or bloated stomach.  Belching.  Chest pain.  Shortness of breath or wheezing.  Ongoing (chronic) cough or a night-time cough.  Wearing away of tooth enamel.  Weight loss. Different conditions can cause chest pain. Make sure to see your health care provider if you experience chest pain. DIAGNOSIS Your health care provider will take a medical history and perform a physical exam. To determine if you have mild or severe  GERD, your health care provider may also monitor how you respond to treatment. You may also have other tests, including:  An endoscopy toexamine your stomach and esophagus with a small camera.  A test thatmeasures the acidity level in your esophagus.  A test thatmeasures how much pressure is on your esophagus.  A barium swallow or modified barium swallow to show the shape, size, and functioning of your esophagus. TREATMENT The goal of treatment is to help relieve your symptoms and to prevent complications. Treatment for this condition may vary depending on how severe your symptoms are. Your health care provider may recommend:  Changes to your diet.  Medicine.  Surgery. HOME CARE INSTRUCTIONS Diet  Follow a diet as recommended by your health care provider. This may involve avoiding foods and drinks such as:  Coffee and tea (with or without caffeine).  Drinks that containalcohol.  Energy drinks and sports drinks.  Carbonated drinks or sodas.  Chocolate and cocoa.  Peppermint and mint flavorings.  Garlic and onions.  Horseradish.  Spicy and acidic foods, including peppers, chili powder, curry powder, vinegar, hot sauces, and barbecue sauce.  Citrus fruit juices and citrus fruits, such as oranges, lemons, and limes.  Tomato-based foods, such as red sauce, chili, salsa, and pizza with red sauce.  Fried and fatty foods, such as donuts, french fries, potato chips, and high-fat dressings.  High-fat meats, such as hot dogs and fatty cuts of red and white meats, such as rib eye steak, sausage, ham, and bacon.  High-fat dairy items, such as whole milk, butter, and cream cheese.  Eat small, frequent meals instead of large meals.  Avoid drinking large amounts of liquid with your meals.  Avoid eating meals during the 2-3 hours before bedtime.  Avoid lying down right after you eat.  Do not exercise right after you eat. General Instructions  Pay attention to any  changes in your symptoms.  Take over-the-counter and prescription medicines only as told by your health care provider. Do not take aspirin, ibuprofen, or other NSAIDs unless your health care provider told you to do so.  Do not use any tobacco products, including cigarettes, chewing tobacco, and e-cigarettes. If you need help quitting, ask your health care provider.  Wear loose-fitting clothing. Do not wear anything tight around your waist that causes pressure on your abdomen.  Raise (elevate) the head of your bed 6 inches (15cm).  Try to reduce your stress, such as with yoga or meditation. If you need help reducing stress, ask your health care provider.  If you are overweight, reduce your weight to an amount that is healthy for you. Ask your health care provider for guidance about a safe weight loss goal.  Keep all follow-up visits as told by your health care provider. This is important. SEEK MEDICAL CARE IF:  You have new symptoms.  You have unexplained weight loss.  You have difficulty swallowing, or it hurts to swallow.  You have wheezing or a persistent cough.  Your symptoms do not improve with treatment.  You have a hoarse voice. SEEK IMMEDIATE MEDICAL CARE IF:  You have pain in your arms, neck, jaw, teeth,  or back.  You feel sweaty, dizzy, or light-headed.  You have chest pain or shortness of breath.  You vomit and your vomit looks like blood or coffee grounds.  You faint.  Your stool is bloody or black.  You cannot swallow, drink, or eat.   This information is not intended to replace advice given to you by your health care provider. Make sure you discuss any questions you have with your health care provider.   Document Released: 01/29/2005 Document Revised: 01/10/2015 Document Reviewed: 08/16/2014 Elsevier Interactive Patient Education [employer].

## 2015-09-04 NOTE — Interval H&P Note (Signed)
History and Physical Interval Note:  09/04/2015 7:31 AM  Charles [name]  has presented today for surgery, with the diagnosis of gerd  The various methods of treatment have been discussed with the patient and family. After consideration of risks, benefits and other options for treatment, the patient has consented to  Procedure(s) with comments: ESOPHAGOGASTRODUODENOSCOPY (EGD) (N/A) - 730 as a surgical intervention .  The patient's history has been reviewed, patient examined, no change in status, stable for surgery.  I have reviewed the patient's chart and labs.  Questions were answered to the patient's satisfaction.      No change; no dysphagia; pt remains anticoagulated per plan. EGD today. The risks, benefits, limitations, alternatives and imponderables have been reviewed with the patient. Potential for esophageal dilation, biopsy, etc. have also been reviewed.  Questions have been answered. All parties agreeable.  Manus Rudd

## 2015-09-06 ENCOUNTER — Encounter (HOSPITAL_COMMUNITY): Payer: Self-pay | Admitting: Internal Medicine

## 2015-09-10 ENCOUNTER — Ambulatory Visit (HOSPITAL_COMMUNITY): Payer: Medicare Other

## 2015-09-14 ENCOUNTER — Encounter (HOSPITAL_COMMUNITY): Payer: Medicare Other

## 2015-09-14 ENCOUNTER — Encounter (HOSPITAL_COMMUNITY): Payer: Medicare Other | Attending: Oncology | Admitting: Oncology

## 2015-09-14 ENCOUNTER — Encounter (HOSPITAL_COMMUNITY): Payer: Self-pay | Admitting: Oncology

## 2015-09-14 ENCOUNTER — Ambulatory Visit (HOSPITAL_COMMUNITY)
Admission: RE | Admit: 2015-09-14 | Discharge: 2015-09-14 | Disposition: A | Discharge status: Home / Self Care | Payer: Medicare Other | Source: Ambulatory Visit | Attending: Hematology & Oncology | Admitting: Hematology & Oncology

## 2015-09-14 VITALS — BP 157/82 | HR 67 | Temp 97.7°F | Resp 18 | Ht 74.0 in | Wt 227.3 lb

## 2015-09-14 DIAGNOSIS — I2510 Atherosclerotic heart disease of native coronary artery without angina pectoris: Secondary | ICD-10-CM | POA: Diagnosis not present

## 2015-09-14 DIAGNOSIS — C20 Malignant neoplasm of rectum: Secondary | ICD-10-CM | POA: Insufficient documentation

## 2015-09-14 DIAGNOSIS — M47816 Spondylosis without myelopathy or radiculopathy, lumbar region: Secondary | ICD-10-CM | POA: Insufficient documentation

## 2015-09-14 DIAGNOSIS — E2749 Other adrenocortical insufficiency: Secondary | ICD-10-CM | POA: Diagnosis not present

## 2015-09-14 DIAGNOSIS — C7801 Secondary malignant neoplasm of right lung: Secondary | ICD-10-CM

## 2015-09-14 DIAGNOSIS — M5136 Other intervertebral disc degeneration, lumbar region: Secondary | ICD-10-CM | POA: Diagnosis not present

## 2015-09-14 DIAGNOSIS — M47814 Spondylosis without myelopathy or radiculopathy, thoracic region: Secondary | ICD-10-CM | POA: Diagnosis not present

## 2015-09-14 DIAGNOSIS — K760 Fatty (change of) liver, not elsewhere classified: Secondary | ICD-10-CM | POA: Insufficient documentation

## 2015-09-14 LAB — COMPREHENSIVE METABOLIC PANEL
ALT: 19 U/L (ref 17–63)
AST: 18 U/L (ref 15–41)
Albumin: 4 g/dL (ref 3.5–5.0)
Alkaline Phosphatase: 70 U/L (ref 38–126)
Anion gap: 5 (ref 5–15)
BUN: 12 mg/dL (ref 6–20)
CO2: 26 mmol/L (ref 22–32)
Calcium: 9.1 mg/dL (ref 8.9–10.3)
Chloride: 105 mmol/L (ref 101–111)
Creatinine, Ser: 0.74 mg/dL (ref 0.61–1.24)
GFR calc Af Amer: >60 mL/min (ref >60)
GFR calc non Af Amer: >60 mL/min (ref >60)
Glucose, Bld: 133 mg/dL — ABNORMAL HIGH (ref 65–99)
Potassium: 4.1 mmol/L (ref 3.5–5.1)
Sodium: 136 mmol/L (ref 135–145)
Total Bilirubin: 1.1 mg/dL (ref 0.3–1.2)
Total Protein: 6.8 g/dL (ref 6.5–8.1)

## 2015-09-14 LAB — CBC WITH DIFFERENTIAL/PLATELET
Basophils Absolute: 0 10*3/uL (ref 0.0–0.1)
Basophils Relative: 0 %
Eosinophils Absolute: 0.1 10*3/uL (ref 0.0–0.7)
Eosinophils Relative: 2 %
HCT: 43.7 % (ref 39.0–52.0)
Hemoglobin: 14.8 g/dL (ref 13.0–17.0)
Lymphocytes Relative: 32 %
Lymphs Abs: 2.1 10*3/uL (ref 0.7–4.0)
MCH: 29.4 pg (ref 26.0–34.0)
MCHC: 33.9 g/dL (ref 30.0–36.0)
MCV: 86.7 fL (ref 78.0–100.0)
Monocytes Absolute: 0.5 10*3/uL (ref 0.1–1.0)
Monocytes Relative: 7 %
Neutro Abs: 3.8 10*3/uL (ref 1.7–7.7)
Neutrophils Relative %: 59 %
Platelets: 214 10*3/uL (ref 150–400)
RBC: 5.04 MIL/uL (ref 4.22–5.81)
RDW: 13.5 % (ref 11.5–15.5)
WBC: 6.5 10*3/uL (ref 4.0–10.5)

## 2015-09-14 LAB — POCT I-STAT CREATININE: Creatinine, Ser: 0.8 mg/dL (ref 0.61–1.24)

## 2015-09-14 MED ORDER — IOPAMIDOL (ISOVUE-300) INJECTION 61%
100.0000 mL | Freq: Once | INTRAVENOUS | Status: AC | PRN
  Administered 2015-09-14: 100 mL via INTRAVENOUS

## 2015-09-14 NOTE — Progress Notes (Signed)
Charles Chroman, MD [place] [address]  Rectal cancer [facility]) - Plan: CBC with Differential, Comprehensive metabolic panel, CEA, CBC with Differential, Comprehensive metabolic panel, CEA, CT Chest W Contrast, CT Abdomen Pelvis W Contrast  CURRENT THERAPY: Surveillance per NCCN guidelines  INTERVAL HISTORY: Charles [name] 65 y.o. male returns for followup of Stage IV rectal cancer with a solitary metastases to RML of lung treated with bronchoscopy, R VATS, and wedge resection of RML on 11/16/2014 by Dr. Servando Snare discovered on routine surveillance of previously treated Stage IIIB (T3N1M0) rectal cancer status post preoperative chemoradiation with continuous 5FU infusion from 09/09/2010 -10/10/2010.  He then underwent adjuvant chemotherapy with FOLFOX from October 2012 - 07/14/2011.  In 2016, solitary pulmonary nodule was seen on imaging leading to biopsy proving metastatic disease.  This was treated surgically as mentioned above.    Rectal cancer ([name])   07/24/2010 Initial Diagnosis Invasive adenocarcinoma of rectum   09/09/2010 Concurrent Chemotherapy S/P radiation and concurrent 5-FU continuous infusion from 09/09/10- 10/10/10.   11/14/2010 Surgery S/P proctectomy with colorectal anastomosis and diverting loop ileostomy on 11/14/10 at [facility] by Dr. Harlon Ditty. Pathology reveals a pT3b N1 with 3/20 lymph nodes.   02/05/2011 - 07/14/2011 Chemotherapy FOLFOX   08/18/2011 Surgery Approximate date of surgery- [place] by Dr. Harlon Ditty    Remission    11/24/2013 Survivorship Colonoscopy- somewhat fibrotic/friable anastomotic mucosal-status post biopsy (narrowing not felt to be clinically significant).  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 The right iliac venous  system is patent and do   09/25/2014 PET scan The right middle lobe pulmonary nodule is hypermetabolic, favored to represent a primary bronchogenic carcinoma.Equivocal mediastinal nodes, similar to surrounding blood pool. Bilateral adrenal hypermetabolism, felt to be physiologic   10/24/2014 Pathology Results Lung, needle/core biopsy(ies), RML - ADENOCARCINOMA, SEE COMMENT metastatic adenocarcinoma of a colorectal primary   10/24/2014 Relapse/Recurrence    11/16/2014 Definitive Surgery Bronchoscopy, right video-assisted thoracoscopy, wedge resection of right middle lobe by Dr. Servando Snare   11/16/2014 Pathology Results Lung, wedge biopsy/resection, right lingula and small portion of middle lobe - METASTATIC ADENOCARCINOMA, CONSISTENT WITH COLORECTAL PRIMARY, SPANNING 2.0 CM. - THE SURGICAL RESECTION MARGINS ARE NEGATIVE FOR ADENOCARCINOMA.   11/16/2014 Remission THE SURGICAL RESECTION MARGINS ARE NEGATIVE FOR ADENOCARCINOMA.   03/07/2015 Imaging CT CAP- Interval resection of right middle lobe metastasis. No acute process or evidence of metastatic disease in the chest, abdomen or pelvis. Improved right upper lobe reticular nodular opacities are favored to represent resolving infection.   09/14/2015 Imaging CT CAP- No findings of recurrent malignancy. No recurrence along the wedge resection site of the right middle lobe. Right anterior abdominal wall focal hernia containing a knuckle of small bowel without complicating feature.     I personally reviewed and went over laboratory results with the patient.  The results are noted within this dictation.  Labs are updated today.  I personally reviewed and went over radiographic studies with the patient.  The results are noted within this dictation.  No findings of recurrent malignancy. No recurrence along the wedge resection site of the right middle lobe.  Patient doing very well. He denies any complaints. I reviewed the NCCN guidelines pertaining to surveillance in  the stage IV setting of rectal cancer.  These have been updated on 07/2015. He is provided a copy of these new surveillance guidelines.  The patient's wife say very important question to her "why was his lung nodule not found earlier with all of the scans that were being done before."  She is educated on the guidelines pertaining to surveillance for early stage rectal cancer. The guidelines do not allow imaging of the chest without signs or symptoms of issues. Fortunately, his blood clot led to imaging of his lung that found his solitary lung metastasis. Now that he has stage IV disease officially, we'll be able to perform CT imaging of chest, abdomen, and pelvis. She is educated regarding the guidelines and insurance guidelines regarding imaging of the chest in his earlier stage rectal cancer setting.  On his workup paper, he mentions the need for refill of Klonopin. I searched the [place] controlled substance database system and it is noted that his primary care physician is the prescriber of his Klonopin. I will defer further refills of this medication to him.   Review of Systems  Constitutional: Negative for fever, chills, weight loss and malaise/fatigue.  HENT: Negative.  Negative for congestion and sore throat.   Eyes: Negative.  Negative for double vision.  Respiratory: Negative.  Negative for cough, hemoptysis and shortness of breath.   Cardiovascular: Negative.  Negative for chest pain and PND.  Gastrointestinal: Negative.  Negative for nausea, vomiting, abdominal pain, diarrhea, constipation, blood in stool and melena.  Genitourinary: Negative.  Negative for frequency and hematuria.  Musculoskeletal: Negative.   Skin: Negative.   Neurological: Negative.  Negative for focal weakness, weakness and headaches.  Endo/Heme/Allergies: Negative.   Psychiatric/Behavioral: Negative.     Past Medical History  Diagnosis Date  . HTN (hypertension)   . Pulmonary embolism ([place])   . GERD  (gastroesophageal reflux disease)   . Depression 04/01/2011  . DVT (deep venous thrombosis) ([place]) 05/09/2011  . High output ileostomy ([place]) 05/21/2011  . Colon cancer ([place]) 07/24/10    rectal ca, inv adenocarcinoma  . Hx of radiation therapy 09/02/10 to 10/14/10    pelvis  . Rectal cancer ([place]) 01/15/2011    S/P radiation and concurrent 5-FU continuous infusion from 09/09/10- 10/10/10.  S/P proctectomy with colorectal anastomosis and diverting loop ileostomy on 11/14/10 at [facility] by Dr. Harlon Ditty. Pathology reveals a pT3b N1 with 3/20 lymph nodes.    . Lung metastasis ([place])   . Peripheral vascular disease ([place])     dvt's,pe  . Pneumonia     hx x3  . Chronic anticoagulation     Past Surgical History  Procedure Laterality Date  . Esophagogastroduodenoscopy  07/2010    RMR: schatki ring s/p dilation, small hh, SB bx benign  . Colonoscopy  07/2010    proximal rectal apple core mass 10-14cm from anal verge (adenocarcinoma), 2-3cm distal rectal carpet polyp s/p piecemeal snare polypectomy (adenoma)  . Eus  08/2010    Dr. Owens Loffler. uT3N0 circumferential, nearly obstruction rectosigmoid adenocarcinoma, distal edge 12cm from anal verge  . Ivc filter    . Colon surgery  11/14/2010    proctectomy with colorectal anastomosis and diverting loop ileostomy (temporary planned)  . Port a cath placement    . Colostomy reversal  april 2013  . Port-a-cath removal  09/24/2011    Procedure: REMOVAL PORT-A-CATH;  Surgeon: Donato Heinz, MD;  Location: [facility] ORS;  Service: General;  Laterality: N/A;  Minor Room  . Ivc filter    . Colonoscopy  04/21/2012    RMR: Friable,fibrotic appearing colorectal anastomosis producing some luminal narrowing-not felt to be critical. path: focal erosion with slight inflammation and hyperemia. SURVEILLANCE DUE DEC 2015  . Colonoscopy N/A 11/24/2013    Dr. Rourk:somewhat fibrotic/friable anastomotic mucosal-status post biopsy (narrowing not felt to be clinically significant)  Single colonic diverticulum. benign polypoid rectal mucosa  . Hernia repair      abd hernia repair  . Video bronchoscopy N/A 11/15/2014    Procedure: VIDEO BRONCHOSCOPY;  Surgeon: [name], MD;  Location: [facility] OR;  Service: Thoracic;  Laterality: N/A;  . Video assisted thoracoscopy (vats)/wedge resection Right 11/15/2014    Procedure: VIDEO ASSISTED THORACOSCOPY (VATS)/LUNG RESECTION WITH RIGHT LINGULECTOMY;  Surgeon: [name], MD;  Location: [place];  Service: Thoracic;  Laterality: Right;  . Esophagogastroduodenoscopy N/A 09/04/2015    Procedure: ESOPHAGOGASTRODUODENOSCOPY (EGD);  Surgeon: Daneil Dolin, MD;  Location: [facility] END[facility] SUITE;  Service: Endoscopy;  Laterality: N/A;  730    Family History  Problem Relation Age of Onset  . Colon cancer Neg Hx   . Liver disease Neg Hx   . Inflammatory bowel disease Neg Hx   . Cancer Brother     throat  . Cancer Brother     prostate    Social History   Social History  . Marital Status: Married    Spouse Name: N/A  . Number of Children: 2  . Years of Education: N/A   Occupational History  . self-employed Regulatory affairs officer, currently not working    Social History Main Topics  . Smoking status: Never Smoker   . Smokeless tobacco: Never Used  . Alcohol Use: No  . Drug Use: No  . Sexual Activity: Not Asked   Other Topics Concern  . None   Social History Narrative     PHYSICAL EXAMINATION  ECOG PERFORMANCE STATUS: 0 - Asymptomatic  Filed Vitals:   09/14/15 1059  BP: 157/82  Pulse: 67  Temp: 97.7 F (36.5 C)  Resp: 18    GENERAL:alert, well nourished, well developed, comfortable, cooperative, smiling and accompanied by his wife SKIN: skin color, texture, turgor are normal, no rashes or significant lesions HEAD: Normocephalic, No masses, lesions, tenderness or abnormalities EYES: normal, EOMI, Conjunctiva are pink and non-injected EARS: External ears normal OROPHARYNX:lips, buccal mucosa, and tongue  normal and mucous membranes are moist  NECK: supple, no adenopathy, thyroid normal size, non-tender, without nodularity, trachea midline LYMPH:  no palpable lymphadenopathy, no hepatosplenomegaly BREAST:not examined LUNGS: clear to auscultation and percussion HEART: regular rate & rhythm, no murmurs, no gallops, S1 normal and S2 normal ABDOMEN:abdomen soft, non-tender, obese, normal bowel sounds and no masses or organomegaly BACK: Back symmetric, no curvature. EXTREMITIES:less then 2 second capillary refill, no joint deformities, effusion, or inflammation, no skin discoloration, no clubbing, no cyanosis  NEURO: alert & oriented x 3 with fluent speech, no focal motor/sensory deficits, gait normal   LABORATORY DATA: CBC    Component Value Date/Time   WBC 6.5 09/14/2015 1018   WBC 6.5 02/04/2011 1059   RBC 5.04 09/14/2015 1018   RBC 4.72 05/21/2012 1015   RBC 4.57 02/04/2011 1059   HGB 14.8 09/14/2015 1018   HGB 11.6* 02/04/2011 1059   HCT 43.7 09/14/2015 1018   HCT 34.7* 02/04/2011 1059   PLT 214 09/14/2015 1018   PLT 282 02/04/2011 1059   MCV 86.7 09/14/2015 1018   MCV 76.0* 02/04/2011 1059   MCH 29.4 09/14/2015 1018   MCH 25.4* 02/04/2011 1059  MCHC 33.9 09/14/2015 1018   MCHC 33.5 02/04/2011 1059   RDW 13.5 09/14/2015 1018   RDW 18.5* 02/04/2011 1059   LYMPHSABS 2.1 09/14/2015 1018   LYMPHSABS 1.1 02/04/2011 1059   MONOABS 0.5 09/14/2015 1018   MONOABS 0.5 02/04/2011 1059   EOSABS 0.1 09/14/2015 1018   EOSABS 0.1 02/04/2011 1059   BASOSABS 0.0 09/14/2015 1018   BASOSABS 0.1 02/04/2011 1059      Chemistry      Component Value Date/Time   NA 142 03/07/2015 0902   NA 136 10/30/2010 0801   K 4.5 03/07/2015 0902   K 4.0 10/30/2010 0801   CL 107 03/07/2015 0902   CL 101 10/30/2010 0801   CO2 28 03/07/2015 0902   CO2 27 10/30/2010 0801   BUN 11 03/07/2015 0902   BUN 9 10/30/2010 0801   CREATININE 0.80 09/14/2015 0759   CREATININE 0.83 02/10/2011 1037        Component Value Date/Time   CALCIUM 9.1 03/07/2015 0902   CALCIUM 8.9 10/30/2010 0801   ALKPHOS 71 03/07/2015 0902   ALKPHOS 89* 10/30/2010 0801   AST 18 03/07/2015 0902   AST 17 10/30/2010 0801   ALT 22 03/07/2015 0902   ALT 13 10/30/2010 0801   BILITOT 1.6* 03/07/2015 0902   BILITOT 1.00 10/30/2010 0801      Lab Results  Component Value Date   CEA 1.6 03/07/2015     PENDING LABS:   RADIOGRAPHIC STUDIES:  Ct Chest W Contrast  09/14/2015  CLINICAL DATA:  Stage IV rectal cancer, restaging. EXAM: CT CHEST, ABDOMEN, AND PELVIS WITH CONTRAST TECHNIQUE: Multidetector CT imaging of the chest, abdomen and pelvis was performed following the standard protocol during bolus administration of intravenous contrast. CONTRAST:  150m ISOVUE-300 IOPAMIDOL (ISOVUE-300) INJECTION 61% COMPARISON:  Multiple exams, including 03/07/2015 FINDINGS: CT CHEST FINDINGS Mediastinum/Nodes: Left anterior descending and circumflex coronary artery atherosclerosis. No pathologic thoracic adenopathy. Lungs/Pleura: Mild biapical pleural parenchymal scarring. Right middle lobe scarring with volume loss along the wedge resection site. Stable mild scarring in the posterior basal segments of both lower lobes. No worrisome lung nodules. Musculoskeletal: Degenerative sternoclavicular arthropathy. Thoracic spondylosis. CT ABDOMEN PELVIS FINDINGS Hepatobiliary: Diffuse hepatic steatosis. Pancreas: Unremarkable Spleen: Unremarkable Adrenals/Urinary Tract: Calcifications along the medial limb of the left adrenal gland, chronic, likely postinflammatory. Otherwise unremarkable. Stomach/Bowel: Anastomotic staple line in the rectum. Vascular/Lymphatic: Infrarenal IVC filter wall position. No pathologic adenopathy identified. No significant abnormal perirectal lymph nodes are noted. Reproductive: Central zone calcifications in the prostate gland. Other: No supplemental non-categorized findings. Musculoskeletal: Right anterior abdominal  wall focal hernia on image 92/2 containing a knuckle of small bowel. No obstruction or strangulation identified. Lumbar spondylosis and degenerative disc disease causing impingement at L3-4, L4-5, and L5-S1. Congenitally short pedicles contribute to the impingement. IMPRESSION: 1. No findings of recurrent malignancy. No recurrence along the wedge resection site of the right middle lobe. 2. Right anterior abdominal wall focal hernia containing a knuckle of small bowel without complicating feature. 3. Lumbar spondylosis and degenerative disc disease along with short pedicles causing lower lumbar impingement. 4. Diffuse hepatic steatosis. 5. Coronary artery atherosclerosis. Electronically Signed   By: WVan ClinesM.D.   On: 09/14/2015 09:35   Ct Abdomen Pelvis W Contrast  09/14/2015  CLINICAL DATA:  Stage IV rectal cancer, restaging. EXAM: CT CHEST, ABDOMEN, AND PELVIS WITH CONTRAST TECHNIQUE: Multidetector CT imaging of the chest, abdomen and pelvis was performed following the standard protocol during bolus administration of intravenous contrast. CONTRAST:  134m ISOVUE-300 IOPAMIDOL (ISOVUE-300) INJECTION 61% COMPARISON:  Multiple exams, including 03/07/2015 FINDINGS: CT CHEST FINDINGS Mediastinum/Nodes: Left anterior descending and circumflex coronary artery atherosclerosis. No pathologic thoracic adenopathy. Lungs/Pleura: Mild biapical pleural parenchymal scarring. Right middle lobe scarring with volume loss along the wedge resection site. Stable mild scarring in the posterior basal segments of both lower lobes. No worrisome lung nodules. Musculoskeletal: Degenerative sternoclavicular arthropathy. Thoracic spondylosis. CT ABDOMEN PELVIS FINDINGS Hepatobiliary: Diffuse hepatic steatosis. Pancreas: Unremarkable Spleen: Unremarkable Adrenals/Urinary Tract: Calcifications along the medial limb of the left adrenal gland, chronic, likely postinflammatory. Otherwise unremarkable. Stomach/Bowel: Anastomotic  staple line in the rectum. Vascular/Lymphatic: Infrarenal IVC filter wall position. No pathologic adenopathy identified. No significant abnormal perirectal lymph nodes are noted. Reproductive: Central zone calcifications in the prostate gland. Other: No supplemental non-categorized findings. Musculoskeletal: Right anterior abdominal wall focal hernia on image 92/2 containing a knuckle of small bowel. No obstruction or strangulation identified. Lumbar spondylosis and degenerative disc disease causing impingement at L3-4, L4-5, and L5-S1. Congenitally short pedicles contribute to the impingement. IMPRESSION: 1. No findings of recurrent malignancy. No recurrence along the wedge resection site of the right middle lobe. 2. Right anterior abdominal wall focal hernia containing a knuckle of small bowel without complicating feature. 3. Lumbar spondylosis and degenerative disc disease along with short pedicles causing lower lumbar impingement. 4. Diffuse hepatic steatosis. 5. Coronary artery atherosclerosis. Electronically Signed   By: WVan ClinesM.D.   On: 09/14/2015 09:35     PATHOLOGY:    ASSESSMENT AND PLAN:  Rectal cancer Stage IV rectal cancer with a solitary metastases to RML of lung treated with bronchoscopy, R VATS, and wedge resection of RML on 11/16/2014 by Dr. GServando Snarediscovered on routine surveillance of previously treated Stage IIIB (T3N1M0) rectal cancer status post preoperative chemoradiation with continuous 5FU infusion from 09/09/2010 -10/10/2010.  He then underwent adjuvant chemotherapy with FOLFOX from October 2012 - 07/14/2011.  In 2016, solitary pulmonary nodule was seen on imaging leading to biopsy proving metastatic disease.  This was treated surgically as mentioned above.  Oncology history is updated.  Staging in [facility] problem list is updated as well.  Labs today: CBC diff, CMET, CEA  I personally reviewed and went over radiographic studies with the patient.  The results are  noted within this dictation.  No findings of recurrent malignancy. No recurrence along the wedge resection site of the right middle lobe.  I reviewed the updated guidelines from NCCN regarding surveillance.  Labs in 3 months: CBC diff, CMET, CEA.  Labs in 6 months: CBC diff, CMET, CEA.  CT CAP with contrast in 6 months per NCCN guidelines.  Return in 3 and 6 months for follow-up.    ORDERS PLACED FOR THIS ENCOUNTER: Orders Placed This Encounter  Procedures  . CT Chest W Contrast  . CT Abdomen Pelvis W Contrast  . CBC with Differential  . Comprehensive metabolic panel  . CEA  . CBC with Differential  . Comprehensive metabolic panel  . CEA    MEDICATIONS PRESCRIBED THIS ENCOUNTER: Meds ordered this encounter  Medications  . Dexlansoprazole 30 MG capsule    Sig: Take 30 mg by mouth daily.    THERAPY PLAN:  NCCN guidelines regarding surveillance for rectal cancer are as follows (3.2017):  1. Stage I with full surgical staging:   A. Colonoscopy 1 year    1. If advanced adenoma, repeat in 1 year.    2. If no advanced adenoma, repeat in 3 years, then every 5 years  2. Stage II, III:   A. History and physical every 3-6 months for 2 years, then every 6 months for a total of 5 years.   B. CEA every 3-6 months for 2 years, then every 6 months for a total of 5 years.   C. Chest/abdominal/pelvic CT every 6-12 months (category 2b for frequency less than 12 months) for a total of 5 years.   D. Colonoscopy in 1 year except if no preoperative colonoscopy due to obstructing lesion, colonoscopy in 3-6 months.    1. If advanced adenoma, repeat in 1 year    2. If no advanced adenoma, repeat in 3 years, then every 5 years.   E. Proctoscopy (with EUS or MRI with contrast) every 3-6 months for the first 2 years, then every 6 months for total of 5 years (for patients treated with transanal excision only).   F. PET/CT scan is not recommended.  3. Stage IV:   A. History and physical every 3-6  months for 2 years, then every 6 months for a total of 5 years.   B. CEA every 3-6 months for 2 years, then every 6 months for a total of 5 years.   C. Chest/abdominal/pelvic CT every 3-6 months (category 2b for frequency less than 6 months) for 2 years, then every 6-12 months for a total of 5 years.   D. Colonoscopy in 1 year except if no preoperative colonoscopy due to obstructing lesion, colonoscopy in 3-6 months.    1. If advanced adenoma, repeat in 1 year    2. If no advanced adenoma, repeat in 3 years, then every 5 years.   E. Proctoscopy (with EUS or MRI with contrast) every 3-6 months for the first 2 years, then every 6 months for total of 5 years (for patients treated with transanal excision only).   F. PET/CT scan is not recommended.          All questions were answered. The patient knows to call the clinic with any problems, questions or concerns. We can certainly see the patient much sooner if necessary.  Patient and plan discussed with Dr. Ancil Linsey and she is in agreement with the aforementioned.   This note is electronically signed by: Doy Mince 09/14/2015 11:35 AM

## 2015-09-14 NOTE — Patient Instructions (Signed)
[place] at [facility]  Discharge Instructions:  Seen and evaluated by Kirby Crigler, PA today.  Return to see Tom with labs prior to visit in 3 months.  Return to see Dr. Whitney Muse with labs and CT prior to visit in 6 months.   Have Dr. Woody Seller refill your Klonopin prescription.  _______________________________________________________________  Thank you for choosing [place] at [facility] to provide your oncology and hematology care.  To afford each patient quality time with our providers, please arrive at least 15 minutes before your scheduled appointment.  You need to re-schedule your appointment if you arrive 10 or more minutes late.  We strive to give you quality time with our providers, and arriving late affects you and other patients whose appointments are after yours.  Also, if you no show three or more times for appointments you may be dismissed from the clinic.  Again, thank you for choosing [place] at [place] hope is that these requests will allow you access to exceptional care and in a timely manner. _______________________________________________________________  If you have questions after your visit, please contact our office at [phone] between the hours of 8:30 a.m. and 5:00 p.m. Voicemails left after 4:30 p.m. will not be returned until the following business day. _______________________________________________________________  For prescription refill requests, have your pharmacy contact our office. _______________________________________________________________  Recommendations made by the consultant and any test results will be sent to your referring physician. _______________________________________________________________

## 2015-09-14 NOTE — Assessment & Plan Note (Addendum)
Stage IV rectal cancer with a solitary metastases to RML of lung treated with bronchoscopy, R VATS, and wedge resection of RML on 11/16/2014 by Dr. Servando Snare discovered on routine surveillance of previously treated Stage IIIB (T3N1M0) rectal cancer status post preoperative chemoradiation with continuous 5FU infusion from 09/09/2010 -10/10/2010.  He then underwent adjuvant chemotherapy with FOLFOX from October 2012 - 07/14/2011.  In 2016, solitary pulmonary nodule was seen on imaging leading to biopsy proving metastatic disease.  This was treated surgically as mentioned above.  Oncology history is updated.  Staging in [facility] problem list is updated as well.  Labs today: CBC diff, CMET, CEA  I personally reviewed and went over radiographic studies with the patient.  The results are noted within this dictation.  No findings of recurrent malignancy. No recurrence along the wedge resection site of the right middle lobe.  I reviewed the updated guidelines from NCCN regarding surveillance.  Labs in 3 months: CBC diff, CMET, CEA.  Labs in 6 months: CBC diff, CMET, CEA.  CT CAP with contrast in 6 months per NCCN guidelines.  Return in 3 and 6 months for follow-up.

## 2015-09-15 LAB — CEA: CEA: 2 ng/mL (ref 0.0–4.7)

## 2015-11-05 DIAGNOSIS — Z299 Encounter for prophylactic measures, unspecified: Secondary | ICD-10-CM | POA: Diagnosis not present

## 2015-11-05 DIAGNOSIS — K219 Gastro-esophageal reflux disease without esophagitis: Secondary | ICD-10-CM | POA: Diagnosis not present

## 2015-11-05 DIAGNOSIS — I82409 Acute embolism and thrombosis of unspecified deep veins of unspecified lower extremity: Secondary | ICD-10-CM | POA: Diagnosis not present

## 2015-11-05 DIAGNOSIS — J069 Acute upper respiratory infection, unspecified: Secondary | ICD-10-CM | POA: Diagnosis not present

## 2015-11-28 DIAGNOSIS — Z299 Encounter for prophylactic measures, unspecified: Secondary | ICD-10-CM | POA: Diagnosis not present

## 2015-11-28 DIAGNOSIS — I2699 Other pulmonary embolism without acute cor pulmonale: Secondary | ICD-10-CM | POA: Diagnosis not present

## 2015-11-28 DIAGNOSIS — G629 Polyneuropathy, unspecified: Secondary | ICD-10-CM | POA: Diagnosis not present

## 2015-11-29 ENCOUNTER — Encounter: Payer: Self-pay | Admitting: Nurse Practitioner

## 2015-11-29 ENCOUNTER — Ambulatory Visit (INDEPENDENT_AMBULATORY_CARE_PROVIDER_SITE_OTHER): Payer: Medicare Other | Admitting: Nurse Practitioner

## 2015-11-29 VITALS — BP 151/78 | HR 68 | Temp 97.0°F | Ht 74.0 in | Wt 216.6 lb

## 2015-11-29 DIAGNOSIS — K219 Gastro-esophageal reflux disease without esophagitis: Secondary | ICD-10-CM | POA: Diagnosis not present

## 2015-11-29 DIAGNOSIS — K5900 Constipation, unspecified: Secondary | ICD-10-CM

## 2015-11-29 NOTE — Progress Notes (Signed)
cc'ed to pcp °

## 2015-11-29 NOTE — Progress Notes (Signed)
Referring Provider: [name], MD Primary Care Physician:  [name], MD Primary GI:  Dr. [name]  Chief Complaint  Patient presents with  . Gastroesophageal Reflux    doing better    HPI:   Charles [name] is a 65 y.o. male who presents for follow-up on GERD. He was last seen in our office 08/30/2015 at which point he continued with unchanged symptoms, daily breakthrough, no dysphagia or odynophagia. All of this is while on Prilosec. As previously recommended, he was scheduled for upper endoscopy while remaining on Xarelto. EGD completed 09/04/2015 which found nonobstructing Schatzki's ring, exam otherwise normal, small hiatal hernia, stomach exam otherwise normal, normal second portion of the duodenum. Patient was changed from Prilosec to Dexilant 60 mg daily.  Today he states he's doing well overall. GERD much better controlled on Dexilant. Denies breakthrough. No abdominal pain, N/V, hematochezia, melena. Has started having intermittent constipation since starting Dexilant, hard stools and straining. Denies chest pain, dyspnea, dizziness, lightheadedness, syncope, near syncope. Denies any other upper or lower GI symptoms.  Past Medical History:  Diagnosis Date  . Chronic anticoagulation   . Colon cancer ([place]) 07/24/10   rectal ca, inv adenocarcinoma  . Depression 04/01/2011  . DVT (deep venous thrombosis) ([place]) 05/09/2011  . GERD (gastroesophageal reflux disease)   . High output ileostomy ([place]) 05/21/2011  . HTN (hypertension)   . Hx of radiation therapy 09/02/10 to 10/14/10   pelvis  . Lung metastasis ([place])   . Peripheral vascular disease ([place])    dvt's,pe  . Pneumonia    hx x3  . Pulmonary embolism ([place])   . Rectal cancer ([place]) 01/15/2011   S/P radiation and concurrent 5-FU continuous infusion from 09/09/10- 10/10/10.  S/P proctectomy with colorectal anastomosis and diverting loop ileostomy on 11/14/10 at [facility] by Dr. Harlon Ditty. Pathology reveals a pT3b N1 with 3/20  lymph nodes.      Past Surgical History:  Procedure Laterality Date  . COLON SURGERY  11/14/2010   proctectomy with colorectal anastomosis and diverting loop ileostomy (temporary planned)  . COLONOSCOPY  07/2010   proximal rectal apple core mass 10-14cm from anal verge (adenocarcinoma), 2-3cm distal rectal carpet polyp s/p piecemeal snare polypectomy (adenoma)  . COLONOSCOPY  04/21/2012   RMR: Friable,fibrotic appearing colorectal anastomosis producing some luminal narrowing-not felt to be critical. path: focal erosion with slight inflammation and hyperemia. SURVEILLANCE DUE DEC 2015  . COLONOSCOPY N/A 11/24/2013   Dr. Rourk:somewhat fibrotic/friable anastomotic mucosal-status post biopsy (narrowing not felt to be clinically significant) Single colonic diverticulum. benign polypoid rectal mucosa  . colostomy reversal  april 2013  . ESOPHAGOGASTRODUODENOSCOPY  07/2010   RMR: schatki ring s/p dilation, small hh, SB bx benign  . ESOPHAGOGASTRODUODENOSCOPY N/A 09/04/2015   Procedure: ESOPHAGOGASTRODUODENOSCOPY (EGD);  Surgeon: Daneil Dolin, MD;  [facility];  Service: Endoscopy;  Laterality: N/A;  730  . EUS  08/2010   Dr. Owens Loffler. uT3N0 circumferential, nearly obstruction rectosigmoid adenocarcinoma, distal edge 12cm from anal verge  . HERNIA REPAIR     abd hernia repair  . IVC filter    . ivc filter    . port a cath placement    . PORT-A-CATH REMOVAL  09/24/2011   Procedure: REMOVAL PORT-A-CATH;  Surgeon: Donato Heinz, MD;  Location: [facility] OR[facility];  Service: General;  Laterality: N/A;  Minor Room  . VIDEO ASSISTED THORACOSCOPY (VATS)/WEDGE RESECTION Right 11/15/2014   Procedure: VIDEO ASSISTED THORACOSCOPY (VATS)/LUNG RESECTION WITH RIGHT  LINGULECTOMY;  Surgeon: [name], MD;  Location: [facility];  Service: Thoracic;  Laterality: Right;  Marland Kitchen VIDEO BRONCHOSCOPY N/A 11/15/2014   Procedure: VIDEO BRONCHOSCOPY;  Surgeon: [name], MD;  Location: [facility] OR;  Service: Thoracic;   Laterality: N/A;    Current Outpatient Prescriptions  Medication Sig Dispense Refill  . clonazePAM (KLONOPIN) 0.5 MG tablet Take 0.5 mg by mouth at bedtime. Reported on 09/14/2015    . Dexlansoprazole 30 MG capsule Take 30 mg by mouth daily.    . diphenhydramine-acetaminophen (TYLENOL PM) 25-500 MG TABS Take 1 tablet by mouth at bedtime as needed (sleep).     . pregabalin (LYRICA) 75 MG capsule Take 75 mg by mouth 2 (two) times daily.    . psyllium (METAMUCIL) 58.6 % powder Take 1 packet by mouth daily.     . rivaroxaban (XARELTO) 20 MG TABS tablet Take 1 tablet (20 mg total) by mouth daily with supper. 30 tablet 3  . vitamin B-12 (CYANOCOBALAMIN) 1000 MCG tablet Take 1,000 mcg by mouth daily.    . vitamin E 400 UNIT capsule Take 400 Units by mouth daily.     No current facility-administered medications for this visit.    Facility-Administered Medications Ordered in Other Visits  Medication Dose Route Frequency Provider Last Rate Last Dose  . sodium chloride 0.9 % injection 10 mL  10 mL Intravenous PRN Everardo All, MD   10 mL at 08/22/11 1114    Allergies as of 11/29/2015 - Review Complete 11/29/2015  Allergen Reaction Noted  . Oxycodone  11/23/2014  . Tramadol  11/23/2014  . Trazodone and nefazodone Other (See Comments) 09/08/2014    Family History  Problem Relation Age of Onset  . Cancer Brother     throat  . Cancer Brother     prostate  . Colon cancer Neg Hx   . Liver disease Neg Hx   . Inflammatory bowel disease Neg Hx     Social History   Social History  . Marital status: Married    Spouse name: N/A  . Number of children: 2  . Years of education: N/A   Occupational History  . 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 Smoking status: Never Smoker  . Smokeless tobacco: Never Used  . Alcohol use No  . Drug use: No  . Sexual activity: Not Asked   Other Topics Concern  . None   Social  History Narrative  . None    Review of Systems: General: Negative for anorexia, weight loss, fever, chills, fatigue, weakness. ENT: Negative for hoarseness, difficulty swallowing. CV: Negative for chest pain, angina, palpitations, peripheral edema.  Respiratory: Negative for dyspnea at rest, cough, sputum, wheezing.  GI: See history of present illness. Endo: Negative for unusual weight change.  Heme: Negative for bruising or bleeding.   Physical Exam: BP (!) 151/78   Pulse 68   Temp 97 F (36.1 C) (Oral)   Ht '6\' 2"'$  (1.88 m)   Wt 216 lb 9.6 oz (98.2 kg)   BMI 27.81 kg/m  General:   Alert and oriented. Pleasant and cooperative. Well-nourished and well-developed.  Ears:  Normal auditory acuity. Cardiovascular:  S1, S2 present without murmurs appreciated. Extremities without clubbing or edema. Respiratory:  Clear to auscultation bilaterally. No wheezes, rales, or rhonchi. No distress.  Gastrointestinal:  +BS, rounded but soft, non-tender and non-distended. No HSM noted. No guarding or rebound. No masses appreciated.  Rectal:  Deferred  Musculoskalatal:  Symmetrical without gross deformities. Neurologic:  Alert and oriented x4;  grossly normal neurologically. Psych:  Alert and cooperative. Normal mood and affect. Heme/Lymph/Immune: No excessive bruising noted.    11/29/2015 8:34 AM   Disclaimer: This note was dictated with voice recognition software. Similar sounding words can inadvertently be transcribed and may not be corrected upon review.

## 2015-11-29 NOTE — Patient Instructions (Signed)
1. Keep taking Dexilant.  2. Avoid trigger foods that worsen her symptoms (tomatoes, etc.). 3. Take Colace stool softener once a day. 4. Take MiraLAX as needed for worsening constipation despite Colace. He can take this up to twice a day as needed. 5. Return for follow-up in 6 months.

## 2015-11-29 NOTE — Assessment & Plan Note (Signed)
Increased, intermittent constipation with hard stools and straining. Recommend Colace once daily, MiraLAX as needed. Return for follow-up in 6 months.

## 2015-11-29 NOTE — Assessment & Plan Note (Signed)
Improved control on Dexilant. No breakthrough symptoms. Recommend avoid trigger foods, continue Dexilant. Return for follow-up in 6 months.

## 2015-12-05 ENCOUNTER — Ambulatory Visit: Payer: Medicare Other | Admitting: Nurse Practitioner

## 2015-12-11 ENCOUNTER — Ambulatory Visit (INDEPENDENT_AMBULATORY_CARE_PROVIDER_SITE_OTHER): Payer: Medicare Other | Admitting: Internal Medicine

## 2015-12-11 ENCOUNTER — Encounter: Payer: Self-pay | Admitting: Internal Medicine

## 2015-12-11 VITALS — BP 154/81 | HR 80 | Temp 97.6°F | Ht 74.0 in | Wt 219.6 lb

## 2015-12-11 DIAGNOSIS — K5909 Other constipation: Secondary | ICD-10-CM

## 2015-12-11 DIAGNOSIS — K219 Gastro-esophageal reflux disease without esophagitis: Secondary | ICD-10-CM | POA: Diagnosis not present

## 2015-12-11 DIAGNOSIS — K602 Anal fissure, unspecified: Secondary | ICD-10-CM | POA: Diagnosis not present

## 2015-12-11 NOTE — Patient Instructions (Signed)
Nitroglycerin 0.125% ointment-30 g-apply a pea-sized amount to the anorectum 3 times a day   Continue Colace 100 mg orally twice daily  Use MiraLax 17 g orally at bedtime as needed for constipation  Go back to Prilosec for GERD; use 20 mg twice daily  Information on anal fissure and GERD provided to the patient  Patient is to call in one week and let us know how he is doing and we will go from there

## 2015-12-11 NOTE — Progress Notes (Signed)
Primary Care Physician:  [name], MD Primary Gastroenterologist:  Dr. [name]  Pre-Procedure History & Physical: HPI:  Charles [name] is a 65 y.o. male here for GERD in anal pain/bleeding. Just seen last week for flare of GERD. Dexilant was controlling symptoms for him very well. Co-pay has gone up well over $100. Cannot afford it. Back on Prilosec 20 mg once daily but control not as well.  He walked in today to be seen because of a one-week history of constipation and severe anorectal pain with some bleeding. Feels like he passes "barbed wire" when he has a bowel movement . Last colonoscopy 2015 was good. He is due for surveillance given history of rectal cancer2018. He is followed closely by Dr. Imagene Gurney.  Past Medical History:  Diagnosis Date  . Chronic anticoagulation   . Colon cancer ([place]) 07/24/10   rectal ca, inv adenocarcinoma  . Depression 04/01/2011  . DVT (deep venous thrombosis) ([place]) 05/09/2011  . GERD (gastroesophageal reflux disease)   . High output ileostomy ([place]) 05/21/2011  . HTN (hypertension)   . Hx of radiation therapy 09/02/10 to 10/14/10   pelvis  . Lung metastasis ([place])   . Peripheral vascular disease ([place])    dvt's,pe  . Pneumonia    hx x3  . Pulmonary embolism ([place])   . Rectal cancer ([place]) 01/15/2011   S/P radiation and concurrent 5-FU continuous infusion from 09/09/10- 10/10/10.  S/P proctectomy with colorectal anastomosis and diverting loop ileostomy on 11/14/10 at [facility] by Dr. Harlon Ditty. Pathology reveals a pT3b N1 with 3/20 lymph nodes.      Past Surgical History:  Procedure Laterality Date  . COLON SURGERY  11/14/2010   proctectomy with colorectal anastomosis and diverting loop ileostomy (temporary planned)  . COLONOSCOPY  07/2010   proximal rectal apple core mass 10-14cm from anal verge (adenocarcinoma), 2-3cm distal rectal carpet polyp s/p piecemeal snare polypectomy (adenoma)  . COLONOSCOPY  04/21/2012   RMR: Friable,fibrotic appearing  colorectal anastomosis producing some luminal narrowing-not felt to be critical. path: focal erosion with slight inflammation and hyperemia. SURVEILLANCE DUE DEC 2015  . COLONOSCOPY N/A 11/24/2013   Dr. Anelisse [name]:somewhat fibrotic/friable anastomotic mucosal-status post biopsy (narrowing not felt to be clinically significant) Single colonic diverticulum. benign polypoid rectal mucosa  . colostomy reversal  april 2013  . ESOPHAGOGASTRODUODENOSCOPY  07/2010   RMR: schatki ring s/p dilation, small hh, SB bx benign  . ESOPHAGOGASTRODUODENOSCOPY N/A 09/04/2015   Procedure: ESOPHAGOGASTRODUODENOSCOPY (EGD);  Surgeon: Daneil Dolin, MD;  [facility];  Service: Endoscopy;  Laterality: N/A;  730  . EUS  08/2010   Dr. Owens Loffler. uT3N0 circumferential, nearly obstruction rectosigmoid adenocarcinoma, distal edge 12cm from anal verge  . HERNIA REPAIR     abd hernia repair  . IVC filter    . ivc filter    . port a cath placement    . PORT-A-CATH REMOVAL  09/24/2011   Procedure: REMOVAL PORT-A-CATH;  Surgeon: Donato Heinz, MD;  Location: [facility];  Service: General;  Laterality: N/A;  Minor Room  . VIDEO ASSISTED THORACOSCOPY (VATS)/WEDGE RESECTION Right 11/15/2014   Procedure: VIDEO ASSISTED THORACOSCOPY (VATS)/LUNG RESECTION WITH RIGHT LINGULECTOMY;  Surgeon: [name], MD;  Location: [place];  Service: Thoracic;  Laterality: Right;  Marland Kitchen VIDEO BRONCHOSCOPY N/A 11/15/2014   Procedure: VIDEO BRONCHOSCOPY;  Surgeon: [name], MD;  Location: [facility] OR;  Service: Thoracic;  Laterality: N/A;    Prior to Admission medications  Medication Sig Start Date End Date Taking? Authorizing Provider  clonazePAM (KLONOPIN) 0.5 MG tablet Take 0.5 mg by mouth at bedtime. Reported on 09/14/2015   Yes Historical Provider, MD  Dexlansoprazole 30 MG capsule Take 30 mg by mouth daily. 09/04/15  Yes Historical Provider, MD  diphenhydramine-acetaminophen (TYLENOL PM) 25-500 MG TABS Take 1 tablet by mouth at bedtime  as needed (sleep).    Yes Historical Provider, MD  pregabalin (LYRICA) 75 MG capsule Take 75 mg by mouth 2 (two) times daily.   Yes Historical Provider, MD  psyllium (METAMUCIL) 58.6 % powder Take 1 packet by mouth daily.    Yes Historical Provider, MD  rivaroxaban (XARELTO) 20 MG TABS tablet Take 1 tablet (20 mg total) by mouth daily with supper. 11/18/14  Yes Erin R Barrett, PA-C  vitamin B-12 (CYANOCOBALAMIN) 1000 MCG tablet Take 1,000 mcg by mouth daily.   Yes Historical Provider, MD  vitamin E 400 UNIT capsule Take 400 Units by mouth daily.   Yes Historical Provider, MD    Allergies as of 12/11/2015 - Review Complete 12/11/2015  Allergen Reaction Noted  . Oxycodone  11/23/2014  . Tramadol  11/23/2014  . Trazodone and nefazodone Other (See Comments) 09/08/2014    Family History  Problem Relation Age of Onset  . Cancer Brother     throat  . Cancer Brother     prostate  . Colon cancer Neg Hx   . Liver disease Neg Hx   . Inflammatory bowel disease Neg Hx     Social History   Social History  . Marital status: Married    Spouse name: N/A  . Number of children: 2  . Years of education: N/A   Occupational History  . 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 Smoking status: Never Smoker  . Smokeless tobacco: Never Used  . Alcohol use No  . Drug use: No  . Sexual activity: Not on file   Other Topics Concern  . Not on file   Social History Narrative  . No narrative on file    Review of Systems: See HPI, otherwise negative ROS  Physical Exam: BP (!) 154/81 (BP Location: Right Arm, Patient Position: Sitting, Cuff Size: Normal)   Pulse 80   Temp 97.6 F (36.4 C) (Oral)   Ht '6\' 2"'$  (1.88 m)   Wt 219 lb 9.6 oz (99.6 kg)   BMI 28.19 kg/m  General:   Alert,  Well-developed, well-nourished, pleasant and cooperative in NAD Skin:  Intact without significant lesions or rashes. Lungs:  Clear throughout  to auscultation.   No wheezes, crackles, or rhonchi. No acute distress. Heart:  Regular rate and rhythm; no murmurs, clicks, rubs,  or gallops. Abdomen: Non-distended, normal bowel sounds.  Soft and nontender without appreciable mass or hepatosplenomegaly.  Pulses:  Normal pulses noted. Extremities:  Without clubbing or edema. Rectal:  No external lesions. He has tight anal sphincter tone and is exquisitely tender to attempted digital rectal exam. Alternately, complete DRE not done because of pain.   Impression/Plan:  Very pleasant 65 year old gentleman history of rectal cancer-in remission now with recent constipation and severe anorectal pain with intermittent bleeding on anticoagulation therapy. Clinically, patient will certainly has an anal fissure precipitated by recent constipation. This is entity discussed with the patient and patient's wife at length. GERD suboptimally controlled on once daily omeprazole. Dexilant now too expensive.  Recommendations:  Nitroglycerin 0.125% ointment-30 g-apply a pea-sized amount to  the anorectum 3 times a day   Continue Colace 100 mg orally twice daily  Use MiraLax 17 g orally at bedtime as needed for constipation  Go back to Prilosec for GERD; use 20 mg twice daily  Information on anal fissure and GERD provided to the patient  Patient is to call in one week and let us know how he is doing and we will go from there     Notice: This dictation was prepared with Dragon dictation along with smaller phrase technology. Any transcriptional errors that result from this process are unintentional and may not be corrected upon review.

## 2015-12-14 ENCOUNTER — Encounter (HOSPITAL_COMMUNITY): Payer: Medicare Other

## 2015-12-14 ENCOUNTER — Encounter (HOSPITAL_COMMUNITY): Payer: Self-pay | Admitting: Oncology

## 2015-12-14 ENCOUNTER — Encounter (HOSPITAL_COMMUNITY): Payer: Medicare Other | Attending: Oncology | Admitting: Oncology

## 2015-12-14 DIAGNOSIS — C20 Malignant neoplasm of rectum: Secondary | ICD-10-CM | POA: Diagnosis not present

## 2015-12-14 LAB — CBC WITH DIFFERENTIAL/PLATELET
Basophils Absolute: 0 10*3/uL (ref 0.0–0.1)
Basophils Relative: 0 %
Eosinophils Absolute: 0.1 10*3/uL (ref 0.0–0.7)
Eosinophils Relative: 2 %
HCT: 42.9 % (ref 39.0–52.0)
Hemoglobin: 14.7 g/dL (ref 13.0–17.0)
Lymphocytes Relative: 35 %
Lymphs Abs: 2.1 10*3/uL (ref 0.7–4.0)
MCH: 29.8 pg (ref 26.0–34.0)
MCHC: 34.3 g/dL (ref 30.0–36.0)
MCV: 86.8 fL (ref 78.0–100.0)
Monocytes Absolute: 0.4 10*3/uL (ref 0.1–1.0)
Monocytes Relative: 6 %
Neutro Abs: 3.5 10*3/uL (ref 1.7–7.7)
Neutrophils Relative %: 57 %
Platelets: 205 10*3/uL (ref 150–400)
RBC: 4.94 MIL/uL (ref 4.22–5.81)
RDW: 12.8 % (ref 11.5–15.5)
WBC: 6.1 10*3/uL (ref 4.0–10.5)

## 2015-12-14 LAB — COMPREHENSIVE METABOLIC PANEL
ALT: 20 U/L (ref 17–63)
AST: 18 U/L (ref 15–41)
Albumin: 3.6 g/dL (ref 3.5–5.0)
Alkaline Phosphatase: 77 U/L (ref 38–126)
Anion gap: 4 — ABNORMAL LOW (ref 5–15)
BUN: 12 mg/dL (ref 6–20)
CO2: 26 mmol/L (ref 22–32)
Calcium: 8.3 mg/dL — ABNORMAL LOW (ref 8.9–10.3)
Chloride: 107 mmol/L (ref 101–111)
Creatinine, Ser: 0.81 mg/dL (ref 0.61–1.24)
GFR calc Af Amer: >60 mL/min (ref >60)
GFR calc non Af Amer: >60 mL/min (ref >60)
Glucose, Bld: 150 mg/dL — ABNORMAL HIGH (ref 65–99)
Potassium: 4.1 mmol/L (ref 3.5–5.1)
Sodium: 137 mmol/L (ref 135–145)
Total Bilirubin: 1 mg/dL (ref 0.3–1.2)
Total Protein: 6.4 g/dL — ABNORMAL LOW (ref 6.5–8.1)

## 2015-12-14 NOTE — Progress Notes (Signed)
Charles Chroman, MD [place] [address]  Rectal cancer [facility])  CURRENT THERAPY: Surveillance per NCCN guidelines  INTERVAL HISTORY: Charles [name] 65 y.o. male returns for followup of Stage IV rectal cancer with a solitary metastases to RML of lung treated with bronchoscopy, R VATS, and wedge resection of RML on 11/16/2014 by Dr. Servando Snare discovered on routine surveillance of previously treated Stage IIIB (T3N1M0) rectal cancer status post preoperative chemoradiation with continuous 5FU infusion from 09/09/2010 -10/10/2010.  He then underwent adjuvant chemotherapy with FOLFOX from October 2012 - 07/14/2011.  In 2016, solitary pulmonary nodule was seen on imaging leading to biopsy proving metastatic disease.  This was treated surgically as mentioned above.    Rectal cancer ([name])   07/24/2010 Initial Diagnosis    Invasive adenocarcinoma of rectum     09/09/2010 Concurrent Chemotherapy    S/P radiation and concurrent 5-FU continuous infusion from 09/09/10- 10/10/10.     11/14/2010 Surgery    S/P proctectomy with colorectal anastomosis and diverting loop ileostomy on 11/14/10 at [facility] by Dr. Harlon Ditty. Pathology reveals a pT3b N1 with 3/20 lymph nodes.     02/05/2011 - 07/14/2011 Chemotherapy    FOLFOX     08/18/2011 Surgery    Approximate date of surgery- [place] by Dr. Harlon Ditty      Remission         11/24/2013 Survivorship    Colonoscopy- somewhat fibrotic/friable anastomotic mucosal-status post biopsy (narrowing not felt to be clinically significant).  Negative pathology for malignancy     09/12/2014 Imaging    DVT in the left femoral venous system, left common iliac vein, IVC, and within the IVC filter Right lower extremity venography confirms chronic occlusion of the femoral venous system with collateralization. The right iliac venous system is patent and do     09/25/2014 PET scan    The right middle lobe pulmonary nodule is hypermetabolic, favored to  represent a primary bronchogenic carcinoma.Equivocal mediastinal nodes, similar to surrounding blood pool. Bilateral adrenal hypermetabolism, felt to be physiologic     10/24/2014 Pathology Results    Lung, needle/core biopsy(ies), RML - ADENOCARCINOMA, SEE COMMENT metastatic adenocarcinoma of a colorectal primary     10/24/2014 Relapse/Recurrence         11/16/2014 Definitive Surgery    Bronchoscopy, right video-assisted thoracoscopy, wedge resection of right middle lobe by Dr. Servando Snare     11/16/2014 Pathology Results    Lung, wedge biopsy/resection, right lingula and small portion of middle lobe - METASTATIC ADENOCARCINOMA, CONSISTENT WITH COLORECTAL PRIMARY, SPANNING 2.0 CM. - THE SURGICAL RESECTION MARGINS ARE NEGATIVE FOR ADENOCARCINOMA.     11/16/2014 Remission    THE SURGICAL RESECTION MARGINS ARE NEGATIVE FOR ADENOCARCINOMA.     03/07/2015 Imaging    CT CAP- Interval resection of right middle lobe metastasis. No acute process or evidence of metastatic disease in the chest, abdomen or pelvis. Improved right upper lobe reticular nodular opacities are favored to represent resolving infection.     09/14/2015 Imaging    CT CAP- No findings of recurrent malignancy. No recurrence along the wedge resection site of the right middle lobe. Right anterior abdominal wall focal hernia containing a knuckle of small bowel without complicating feature.      How do you feel I asked?  "Like a fox that just got screwed."  He notes that her feels great.  He denies ay complaints.  Bowels are working well.  He denies  any blood or dark stools.  He reports a recent change to Lyrica for his peripheral neuropathy by his primary care provider with excellent response.  Unfortunately, it is very expensive for him.  He is back to working full time.  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  Review of Systems  Constitutional: Negative.  Negative for chills, fever and weight loss.  HENT: Negative.   Eyes: Negative.     Respiratory: Negative.   Cardiovascular: Negative.   Gastrointestinal: Negative.  Negative for abdominal pain, blood in stool, constipation, diarrhea and melena.  Genitourinary: Negative.   Musculoskeletal: Negative.   Skin: Negative.   Neurological: Negative.  Negative for weakness.  Endo/Heme/Allergies: Negative.   Psychiatric/Behavioral: Negative.     Past Medical History:  Diagnosis Date  . Chronic anticoagulation   . Colon cancer ([place]) 07/24/10   rectal ca, inv adenocarcinoma  . Depression 04/01/2011  . DVT (deep venous thrombosis) ([place]) 05/09/2011  . GERD (gastroesophageal reflux disease)   . High output ileostomy ([place]) 05/21/2011  . HTN (hypertension)   . Hx of radiation therapy 09/02/10 to 10/14/10   pelvis  . Lung metastasis ([place])   . Peripheral vascular disease ([place])    dvt's,pe  . Pneumonia    hx x3  . Pulmonary embolism ([place])   . Rectal cancer ([name]) 01/15/2011   S/P radiation and concurrent 5-FU continuous infusion from 09/09/10- 10/10/10.  S/P proctectomy with colorectal anastomosis and diverting loop ileostomy on 11/14/10 at [facility] by Dr. Harlon Ditty. Pathology reveals a pT3b N1 with 3/20 lymph nodes.      Past Surgical History:  Procedure Laterality Date  . COLON SURGERY  11/14/2010   proctectomy with colorectal anastomosis and diverting loop ileostomy (temporary planned)  . COLONOSCOPY  07/2010   proximal rectal apple core mass 10-14cm from anal verge (adenocarcinoma), 2-3cm distal rectal carpet polyp s/p piecemeal snare polypectomy (adenoma)  . COLONOSCOPY  04/21/2012   RMR: Friable,fibrotic appearing colorectal anastomosis producing some luminal narrowing-not felt to be critical. path: focal erosion with slight inflammation and hyperemia. SURVEILLANCE DUE DEC 2015  . COLONOSCOPY N/A 11/24/2013   Dr. Rourk:somewhat fibrotic/friable anastomotic mucosal-status post biopsy (narrowing not felt to be clinically significant) Single colonic diverticulum. benign polypoid  rectal mucosa  . colostomy reversal  april 2013  . ESOPHAGOGASTRODUODENOSCOPY  07/2010   RMR: schatki ring s/p dilation, small hh, SB bx benign  . ESOPHAGOGASTRODUODENOSCOPY N/A 09/04/2015   Procedure: ESOPHAGOGASTRODUODENOSCOPY (EGD);  Surgeon: Daneil Dolin, MD;  Location: [facility];  Service: Endoscopy;  Laterality: N/A;  730  . EUS  08/2010   Dr. Owens Loffler. uT3N0 circumferential, nearly obstruction rectosigmoid adenocarcinoma, distal edge 12cm from anal verge  . HERNIA REPAIR     abd hernia repair  . IVC filter    . ivc filter    . port a cath placement    . PORT-A-CATH REMOVAL  09/24/2011   Procedure: REMOVAL PORT-A-CATH;  Surgeon: Donato Heinz, MD;  Location: [facility] ORS;  Service: General;  Laterality: N/A;  Minor Room  . VIDEO ASSISTED THORACOSCOPY (VATS)/WEDGE RESECTION Right 11/15/2014   Procedure: VIDEO ASSISTED THORACOSCOPY (VATS)/LUNG RESECTION WITH RIGHT LINGULECTOMY;  Surgeon: [name], MD;  Location: [place];  Service: Thoracic;  Laterality: Right;  Marland Kitchen VIDEO BRONCHOSCOPY N/A 11/15/2014   Procedure: VIDEO BRONCHOSCOPY;  Surgeon: [name], MD;  Location: [facility] OR;  Service: Thoracic;  Laterality: N/A;    Family History  Problem Relation Age of Onset  . Cancer Brother  throat  . Cancer Brother     prostate  . Colon cancer Neg Hx   . Liver disease Neg Hx   . Inflammatory bowel disease Neg Hx     Social History   Social History  . Marital status: Married    Spouse name: N/A  . Number of children: 2  . Years of education: N/A   Occupational History  . 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 Smoking status: Never Smoker  . Smokeless tobacco: Never Used  . Alcohol use No  . Drug use: No  . Sexual activity: Not Asked   Other Topics Concern  . None   Social History Narrative  . None     PHYSICAL EXAMINATION  ECOG PERFORMANCE STATUS: 0 - Asymptomatic  Vitals:    12/14/15 0900  BP: (!) 164/78  Pulse: 69  Resp: 18  Temp: 98 F (36.7 C)    GENERAL:alert, no distress, well nourished, well developed, comfortable, cooperative, smiling and accompanied by his wife. SKIN: skin color, texture, turgor are normal, no rashes or significant lesions HEAD: Normocephalic, No masses, lesions, tenderness or abnormalities EYES: normal, EOMI, Conjunctiva are pink and non-injected EARS: External ears normal OROPHARYNX:lips, buccal mucosa, and tongue normal and mucous membranes are moist  NECK: supple, no adenopathy, thyroid normal size, non-tender, without nodularity, trachea midline LYMPH:  no palpable lymphadenopathy, no hepatosplenomegaly BREAST:not examined LUNGS: clear to auscultation and percussion HEART: regular rate & rhythm, no murmurs, no gallops, S1 normal and S2 normal ABDOMEN:abdomen soft, non-tender and normal bowel sounds BACK: Back symmetric, no curvature. EXTREMITIES:less then 2 second capillary refill, no joint deformities, effusion, or inflammation, no skin discoloration, no cyanosis  NEURO: alert & oriented x 3 with fluent speech, no focal motor/sensory deficits, gait normal   LABORATORY DATA: CBC    Component Value Date/Time   WBC 6.1 12/14/2015 0839   RBC 4.94 12/14/2015 0839   HGB 14.7 12/14/2015 0839   HGB 11.6 (L) 02/04/2011 1059   HCT 42.9 12/14/2015 0839   HCT 34.7 (L) 02/04/2011 1059   PLT 205 12/14/2015 0839   PLT 282 02/04/2011 1059   MCV 86.8 12/14/2015 0839   MCV 76.0 (L) 02/04/2011 1059   MCH 29.8 12/14/2015 0839   MCHC 34.3 12/14/2015 0839   RDW 12.8 12/14/2015 0839   RDW 18.5 (H) 02/04/2011 1059   LYMPHSABS 2.1 12/14/2015 0839   LYMPHSABS 1.1 02/04/2011 1059   MONOABS 0.4 12/14/2015 0839   MONOABS 0.5 02/04/2011 1059   EOSABS 0.1 12/14/2015 0839   EOSABS 0.1 02/04/2011 1059   BASOSABS 0.0 12/14/2015 0839   BASOSABS 0.1 02/04/2011 1059      Chemistry      Component Value Date/Time   NA 137 12/14/2015 0839     NA 136 10/30/2010 0801   K 4.1 12/14/2015 0839   K 4.0 10/30/2010 0801   CL 107 12/14/2015 0839   CL 101 10/30/2010 0801   CO2 26 12/14/2015 0839   CO2 27 10/30/2010 0801   BUN 12 12/14/2015 0839   BUN 9 10/30/2010 0801   CREATININE 0.81 12/14/2015 0839   CREATININE 0.83 02/10/2011 1037      Component Value Date/Time   CALCIUM 8.3 (L) 12/14/2015 0839   CALCIUM 8.9 10/30/2010 0801   ALKPHOS 77 12/14/2015 0839   ALKPHOS 89 (H) 10/30/2010 0801   AST 18 12/14/2015 0839   AST 17 10/30/2010 0801   ALT 20 12/14/2015 0839  ALT 13 10/30/2010 0801   BILITOT 1.0 12/14/2015 0839   BILITOT 1.00 10/30/2010 0801     Lab Results  Component Value Date   CEA 2.0 09/14/2015      PENDING LABS:   RADIOGRAPHIC STUDIES:  No results found.   PATHOLOGY:    ASSESSMENT AND PLAN:  Rectal cancer Stage IV rectal cancer with a solitary metastases to RML of lung treated with bronchoscopy, R VATS, and wedge resection of RML on 11/16/2014 by Dr. Servando Snare discovered on routine surveillance of previously treated Stage IIIB (T3N1M0) rectal cancer status post preoperative chemoradiation with continuous 5FU infusion from 09/09/2010 -10/10/2010.  He then underwent adjuvant chemotherapy with FOLFOX from October 2012 - 07/14/2011.  In 2016, solitary pulmonary nodule was seen on imaging leading to biopsy proving metastatic disease.  This was treated surgically as mentioned above.  Oncology history is updated.  Labs today: CBC diff, CMET, CEA.  I personally reviewed and went over laboratory results with the patient.  The results are noted within this dictation.  I reviewed the updated guidelines from NCCN regarding surveillance.  Labs in 3 and 6 months: CBC diff, CMET, CEA.  CT CAP with contrast in 3 months per NCCN guidelines.  This is scheduled for 03/14/2016.  His peripheral neuropathy is much improved with a recent switch to Lyrica from Gabapentin by his primary care provider.  Unfortunately,  Lyrica is very costly for the patient.  Return in 3 months for follow-up.   ORDERS PLACED FOR THIS ENCOUNTER: No orders of the defined types were placed in this encounter.   MEDICATIONS PRESCRIBED THIS ENCOUNTER: Meds ordered this encounter  Medications  . amoxicillin (AMOXIL) 500 MG capsule  . DEXILANT 60 MG capsule  . gabapentin (NEURONTIN) 300 MG capsule    THERAPY PLAN:  NCCN guidelines regarding surveillance for rectal cancer are as follows (3. 2017):  1. Stage I with full surgical staging:   A. Colonoscopy 1 year    1. If advanced adenoma, repeat in 1 year.    2. If no advanced adenoma, repeat in 3 years, then every 5 years  2. Stage II, III:   A. History and physical every 3-6 months for 2 years, then every 6 months for a total of 5 years.   B. CEA every 3-6 months for 2 years, then every 6 months for a total of 5 years.   C. Chest/abdominal/pelvic CT every 6-12 months (category 2b for frequency less than 12 months) for a total of 5 years.   D. Colonoscopy in 1 year except if no preoperative colonoscopy due to obstructing lesion, colonoscopy in 3-6 months.    1. If advanced adenoma, repeat in 1 year    2. If no advanced adenoma, repeat in 3 years, then every 5 years.   E. Proctoscopy (with EUS or MRI with contrast) every 3-6 months for the first 2 years, then every 6 months for total of 5 years (for patients treated with transanal excision only).   F. PET/CT scan is not recommended.  3. Stage IV:   A. History and physical every 3-6 months for 2 years, then every 6 months for a total of 5 years.   B. CEA every 3-6 months for 2 years, then every 6 months for a total of 5 years.   C. Chest/abdominal/pelvic CT every 3-6 months (category 2b for frequency less than 6 months) for 2 years, then every 6-12 months for a total of 5 years.   D. Colonoscopy in 1  year except if no preoperative colonoscopy due to obstructing lesion, colonoscopy in 3-6 months.    1. If advanced adenoma,  repeat in 1 year    2. If no advanced adenoma, repeat in 3 years, then every 5 years.   E. Proctoscopy (with EUS or MRI with contrast) every 3-6 months for the first 2 years, then every 6 months for total of 5 years (for patients treated with transanal excision only).   F. PET/CT scan is not recommended.        All questions were answered. The patient knows to call the clinic with any problems, questions or concerns. We can certainly see the patient much sooner if necessary.  Patient and plan discussed with Dr. Ancil Linsey and she is in agreement with the aforementioned.   This note is electronically signed by: Doy Mince 12/14/2015 9:50 PM

## 2015-12-14 NOTE — Patient Instructions (Signed)
[place] at [facility] Discharge Instructions  RECOMMENDATIONS MADE BY THE CONSULTANT AND ANY TEST RESULTS WILL BE SENT TO YOUR REFERRING PHYSICIAN.  Labs are stable.  Low calcium is noted.  CEA is pending.  We will update you when this result is available. Start Calcium daily, about 1000 mg. Start Vit D daily, about 1000 units  We will repeat labs in 3 months We will repeat scans in 3 months We will see you back in 3 months after scans, sooner if needed.  Thank you for choosing [place] at [facility] to provide your oncology and hematology care.  To afford each patient quality time with our provider, please arrive at least 15 minutes before your scheduled appointment time.   Beginning January 23rd 2017 lab work for the [facility] will be done in the  Main lab at [facility] on 1st floor. If you have a lab appointment with the [facility] please come in thru the  Main Entrance and check in at the main information desk  You need to re-schedule your appointment should you arrive 10 or more minutes late.  We strive to give you quality time with our providers, and arriving late affects you and other patients whose appointments are after yours.  Also, if you no show three or more times for appointments you may be dismissed from the clinic at the providers discretion.     Again, thank you for choosing [facility].  Our hope is that these requests will decrease the amount of time that you wait before being seen by our physicians.       _____________________________________________________________  Should you have questions after your visit to [facility], please contact our office at [phone] between the hours of 8:30 a.m. and 4:30 p.m.  Voicemails left after 4:30 p.m. will not be returned until the following business day.  For prescription refill requests, have your pharmacy contact our office.          Resources For Cancer Patients and their Caregivers ? American Cancer Society: Can assist with transportation, wigs, general needs, runs Look Good Feel Better.        270-586-9477 ? Cancer Care: Provides financial assistance, online support groups, medication/co-pay assistance.  1-800-813-HOPE (912)794-0874) ? [facility] Assists [place] cancer patients and their families through emotional , educational and financial support.  (516)586-0191 ? [place] DSS Where to apply for food stamps, Medicaid and utility assistance. (478)272-3984 ? [facility]: Transportation to medical appointments. (509) 195-0146 ? Social Security Administration: May apply for disability if have a Stage IV cancer. 934-426-1081 416-710-7711 ? [employer], Disability and Transit Services: Assists with nutrition, care and transit needs. [place] Support Programs: '@10RELATIVEDAYS'$ @ > Cancer Support Group  2nd Tuesday of the month 1pm-2pm, Journey Room  > Creative Journey  3rd Tuesday of the month 1130am-1pm, Journey Room  > Look Good Feel Better  1st Wednesday of the month 10am-12 noon, Journey Room (Call [place] to register [phone])

## 2015-12-14 NOTE — Assessment & Plan Note (Addendum)
Stage IV rectal cancer with a solitary metastases to RML of lung treated with bronchoscopy, R VATS, and wedge resection of RML on 11/16/2014 by Dr. Servando Snare discovered on routine surveillance of previously treated Stage IIIB (T3N1M0) rectal cancer status post preoperative chemoradiation with continuous 5FU infusion from 09/09/2010 -10/10/2010.  He then underwent adjuvant chemotherapy with FOLFOX from October 2012 - 07/14/2011.  In 2016, solitary pulmonary nodule was seen on imaging leading to biopsy proving metastatic disease.  This was treated surgically as mentioned above.  Oncology history is updated.  Labs today: CBC diff, CMET, CEA.  I personally reviewed and went over laboratory results with the patient.  The results are noted within this dictation.  I reviewed the updated guidelines from NCCN regarding surveillance.  Labs in 3 and 6 months: CBC diff, CMET, CEA.  CT CAP with contrast in 3 months per NCCN guidelines.  This is scheduled for 03/14/2016.  His peripheral neuropathy is much improved with a recent switch to Lyrica from Gabapentin by his primary care provider.  Unfortunately, Lyrica is very costly for the patient.  Return in 3 months for follow-up.

## 2015-12-15 LAB — CEA: CEA: 2.3 ng/mL (ref 0.0–4.7)

## 2015-12-20 ENCOUNTER — Telehealth: Payer: Self-pay | Admitting: Internal Medicine

## 2015-12-20 NOTE — Telephone Encounter (Signed)
Noted, routing to RMR. Pt was given nitro cream.

## 2015-12-20 NOTE — Telephone Encounter (Signed)
Great to hear a positive report. Continue with the plan.

## 2015-12-20 NOTE — Telephone Encounter (Signed)
PT WIFE CALLED WITH UPDATE FROM LAST WEEKS OFFICE VISIT, MEDICINE HAS HELPED AND HE IS DOING A LOT BETTER    [phone]

## 2016-01-16 DIAGNOSIS — K219 Gastro-esophageal reflux disease without esophagitis: Secondary | ICD-10-CM | POA: Diagnosis not present

## 2016-01-16 DIAGNOSIS — Z85038 Personal history of other malignant neoplasm of large intestine: Secondary | ICD-10-CM | POA: Diagnosis not present

## 2016-01-16 DIAGNOSIS — R0602 Shortness of breath: Secondary | ICD-10-CM | POA: Diagnosis not present

## 2016-01-16 DIAGNOSIS — I2782 Chronic pulmonary embolism: Secondary | ICD-10-CM | POA: Diagnosis not present

## 2016-01-16 DIAGNOSIS — T7840XA Allergy, unspecified, initial encounter: Secondary | ICD-10-CM | POA: Diagnosis not present

## 2016-01-16 DIAGNOSIS — Z885 Allergy status to narcotic agent status: Secondary | ICD-10-CM | POA: Diagnosis not present

## 2016-01-16 DIAGNOSIS — R079 Chest pain, unspecified: Secondary | ICD-10-CM | POA: Diagnosis not present

## 2016-01-16 DIAGNOSIS — N400 Enlarged prostate without lower urinary tract symptoms: Secondary | ICD-10-CM | POA: Diagnosis not present

## 2016-01-16 DIAGNOSIS — Z7901 Long term (current) use of anticoagulants: Secondary | ICD-10-CM | POA: Diagnosis not present

## 2016-01-16 DIAGNOSIS — Z91048 Other nonmedicinal substance allergy status: Secondary | ICD-10-CM | POA: Diagnosis not present

## 2016-01-16 DIAGNOSIS — R0789 Other chest pain: Secondary | ICD-10-CM | POA: Diagnosis not present

## 2016-01-22 ENCOUNTER — Telehealth (HOSPITAL_COMMUNITY): Payer: Self-pay | Admitting: *Deleted

## 2016-01-23 NOTE — Telephone Encounter (Signed)
Cancel November CT scan.  Get report from [place] scan.

## 2016-01-24 NOTE — Telephone Encounter (Signed)
Called wife back. Informed her that the ct scan in November was cancelled. Will get labs and scan from [place].

## 2016-01-25 ENCOUNTER — Telehealth (HOSPITAL_COMMUNITY): Payer: Self-pay | Admitting: Hematology & Oncology

## 2016-01-25 NOTE — Telephone Encounter (Signed)
Faxed med rec req to [facility]

## 2016-01-28 DIAGNOSIS — R079 Chest pain, unspecified: Secondary | ICD-10-CM | POA: Diagnosis not present

## 2016-01-28 DIAGNOSIS — B351 Tinea unguium: Secondary | ICD-10-CM | POA: Diagnosis not present

## 2016-01-28 DIAGNOSIS — K219 Gastro-esophageal reflux disease without esophagitis: Secondary | ICD-10-CM | POA: Diagnosis not present

## 2016-01-28 DIAGNOSIS — Z09 Encounter for follow-up examination after completed treatment for conditions other than malignant neoplasm: Secondary | ICD-10-CM | POA: Diagnosis not present

## 2016-02-07 DIAGNOSIS — N400 Enlarged prostate without lower urinary tract symptoms: Secondary | ICD-10-CM | POA: Diagnosis not present

## 2016-02-07 DIAGNOSIS — S99921A Unspecified injury of right foot, initial encounter: Secondary | ICD-10-CM | POA: Diagnosis not present

## 2016-02-07 DIAGNOSIS — Z299 Encounter for prophylactic measures, unspecified: Secondary | ICD-10-CM | POA: Diagnosis not present

## 2016-02-07 DIAGNOSIS — Z713 Dietary counseling and surveillance: Secondary | ICD-10-CM | POA: Diagnosis not present

## 2016-02-07 DIAGNOSIS — R358 Other polyuria: Secondary | ICD-10-CM | POA: Diagnosis not present

## 2016-02-11 DIAGNOSIS — Z79899 Other long term (current) drug therapy: Secondary | ICD-10-CM | POA: Diagnosis not present

## 2016-02-11 DIAGNOSIS — Z86711 Personal history of pulmonary embolism: Secondary | ICD-10-CM | POA: Diagnosis not present

## 2016-02-11 DIAGNOSIS — I10 Essential (primary) hypertension: Secondary | ICD-10-CM | POA: Diagnosis not present

## 2016-02-11 DIAGNOSIS — Z85118 Personal history of other malignant neoplasm of bronchus and lung: Secondary | ICD-10-CM | POA: Diagnosis not present

## 2016-02-11 DIAGNOSIS — Z85048 Personal history of other malignant neoplasm of rectum, rectosigmoid junction, and anus: Secondary | ICD-10-CM | POA: Diagnosis not present

## 2016-02-11 DIAGNOSIS — M436 Torticollis: Secondary | ICD-10-CM | POA: Diagnosis not present

## 2016-02-11 DIAGNOSIS — K219 Gastro-esophageal reflux disease without esophagitis: Secondary | ICD-10-CM | POA: Diagnosis not present

## 2016-02-14 DIAGNOSIS — Z1211 Encounter for screening for malignant neoplasm of colon: Secondary | ICD-10-CM | POA: Diagnosis not present

## 2016-02-14 DIAGNOSIS — Z79899 Other long term (current) drug therapy: Secondary | ICD-10-CM | POA: Diagnosis not present

## 2016-02-14 DIAGNOSIS — Z7189 Other specified counseling: Secondary | ICD-10-CM | POA: Diagnosis not present

## 2016-02-14 DIAGNOSIS — Z0000 Encounter for general adult medical examination without abnormal findings: Secondary | ICD-10-CM | POA: Diagnosis not present

## 2016-02-14 DIAGNOSIS — R5383 Other fatigue: Secondary | ICD-10-CM | POA: Diagnosis not present

## 2016-02-14 DIAGNOSIS — Z1389 Encounter for screening for other disorder: Secondary | ICD-10-CM | POA: Diagnosis not present

## 2016-02-14 DIAGNOSIS — Z713 Dietary counseling and surveillance: Secondary | ICD-10-CM | POA: Diagnosis not present

## 2016-02-14 DIAGNOSIS — Z125 Encounter for screening for malignant neoplasm of prostate: Secondary | ICD-10-CM | POA: Diagnosis not present

## 2016-02-21 DIAGNOSIS — I82409 Acute embolism and thrombosis of unspecified deep veins of unspecified lower extremity: Secondary | ICD-10-CM | POA: Diagnosis not present

## 2016-02-21 DIAGNOSIS — R739 Hyperglycemia, unspecified: Secondary | ICD-10-CM | POA: Diagnosis not present

## 2016-02-21 DIAGNOSIS — G629 Polyneuropathy, unspecified: Secondary | ICD-10-CM | POA: Diagnosis not present

## 2016-02-21 DIAGNOSIS — Z299 Encounter for prophylactic measures, unspecified: Secondary | ICD-10-CM | POA: Diagnosis not present

## 2016-02-25 DIAGNOSIS — M79671 Pain in right foot: Secondary | ICD-10-CM | POA: Diagnosis not present

## 2016-02-25 DIAGNOSIS — G5790 Unspecified mononeuropathy of unspecified lower limb: Secondary | ICD-10-CM | POA: Diagnosis not present

## 2016-02-25 DIAGNOSIS — M25579 Pain in unspecified ankle and joints of unspecified foot: Secondary | ICD-10-CM | POA: Diagnosis not present

## 2016-02-25 DIAGNOSIS — G600 Hereditary motor and sensory neuropathy: Secondary | ICD-10-CM | POA: Diagnosis not present

## 2016-03-14 ENCOUNTER — Ambulatory Visit (HOSPITAL_COMMUNITY): Payer: Medicare Other

## 2016-03-17 ENCOUNTER — Encounter (HOSPITAL_COMMUNITY): Payer: Medicare Other | Attending: Hematology & Oncology | Admitting: Hematology & Oncology

## 2016-03-17 ENCOUNTER — Encounter (HOSPITAL_COMMUNITY): Payer: Self-pay | Admitting: Hematology & Oncology

## 2016-03-17 ENCOUNTER — Encounter (HOSPITAL_COMMUNITY): Payer: Medicare Other

## 2016-03-17 VITALS — BP 157/76 | HR 70 | Temp 98.0°F | Resp 16 | Wt 219.6 lb

## 2016-03-17 DIAGNOSIS — Z85048 Personal history of other malignant neoplasm of rectum, rectosigmoid junction, and anus: Secondary | ICD-10-CM

## 2016-03-17 DIAGNOSIS — T451X5A Adverse effect of antineoplastic and immunosuppressive drugs, initial encounter: Secondary | ICD-10-CM

## 2016-03-17 DIAGNOSIS — Z86718 Personal history of other venous thrombosis and embolism: Secondary | ICD-10-CM

## 2016-03-17 DIAGNOSIS — C7800 Secondary malignant neoplasm of unspecified lung: Secondary | ICD-10-CM | POA: Diagnosis not present

## 2016-03-17 DIAGNOSIS — Z7901 Long term (current) use of anticoagulants: Secondary | ICD-10-CM | POA: Diagnosis not present

## 2016-03-17 DIAGNOSIS — C20 Malignant neoplasm of rectum: Secondary | ICD-10-CM

## 2016-03-17 DIAGNOSIS — G620 Drug-induced polyneuropathy: Secondary | ICD-10-CM

## 2016-03-17 DIAGNOSIS — R911 Solitary pulmonary nodule: Secondary | ICD-10-CM

## 2016-03-17 DIAGNOSIS — K219 Gastro-esophageal reflux disease without esophagitis: Secondary | ICD-10-CM

## 2016-03-17 LAB — COMPREHENSIVE METABOLIC PANEL
ALT: 17 U/L (ref 17–63)
AST: 15 U/L (ref 15–41)
Albumin: 3.7 g/dL (ref 3.5–5.0)
Alkaline Phosphatase: 80 U/L (ref 38–126)
Anion gap: 5 (ref 5–15)
BUN: 14 mg/dL (ref 6–20)
CO2: 28 mmol/L (ref 22–32)
Calcium: 8.8 mg/dL — ABNORMAL LOW (ref 8.9–10.3)
Chloride: 105 mmol/L (ref 101–111)
Creatinine, Ser: 0.89 mg/dL (ref 0.61–1.24)
GFR calc Af Amer: >60 mL/min (ref >60)
GFR calc non Af Amer: >60 mL/min (ref >60)
Glucose, Bld: 205 mg/dL — ABNORMAL HIGH (ref 65–99)
Potassium: 4.5 mmol/L (ref 3.5–5.1)
Sodium: 138 mmol/L (ref 135–145)
Total Bilirubin: 1.1 mg/dL (ref 0.3–1.2)
Total Protein: 6.7 g/dL (ref 6.5–8.1)

## 2016-03-17 LAB — CBC WITH DIFFERENTIAL/PLATELET
Basophils Absolute: 0 10*3/uL (ref 0.0–0.1)
Basophils Relative: 1 %
Eosinophils Absolute: 0.1 10*3/uL (ref 0.0–0.7)
Eosinophils Relative: 2 %
HCT: 43.8 % (ref 39.0–52.0)
Hemoglobin: 14.8 g/dL (ref 13.0–17.0)
Lymphocytes Relative: 34 %
Lymphs Abs: 2.1 10*3/uL (ref 0.7–4.0)
MCH: 29.4 pg (ref 26.0–34.0)
MCHC: 33.8 g/dL (ref 30.0–36.0)
MCV: 86.9 fL (ref 78.0–100.0)
Monocytes Absolute: 0.4 10*3/uL (ref 0.1–1.0)
Monocytes Relative: 6 %
Neutro Abs: 3.7 10*3/uL (ref 1.7–7.7)
Neutrophils Relative %: 57 %
Platelets: 218 10*3/uL (ref 150–400)
RBC: 5.04 MIL/uL (ref 4.22–5.81)
RDW: 13 % (ref 11.5–15.5)
WBC: 6.3 10*3/uL (ref 4.0–10.5)

## 2016-03-17 MED ORDER — GABAPENTIN 300 MG PO CAPS: 600.0000 mg | ORAL_CAPSULE | Freq: Three times a day (TID) | ORAL | 6 refills | Status: DC

## 2016-03-17 NOTE — Progress Notes (Signed)
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 He then underwent adjuvant chemotherapy with FOLFOX. During the radiation therapy and continuous infusion 5-FU from 09/09/2010 through 10/10/2010. FOLFOX was administered from October 2012 through 07/14/2011  Stage IV CRC with solitary pulmonary metastases s/p surgical resection with Dr.Gerhardt  CURRENT THERAPY: Surveillance per NCCN guidelines  INTERVAL HISTORY: Charles [name] 65 y.o. male returns for followup of stage IV CRC. He has done exceptionally well since his surgery. He has no complaints today.   Charles [name] is accompanied by his wife. I personally reviewed and went over laboratory studies and outside imaging studies with the patient.   He did not undergo repeat CT scans here at [facility]. The patient states he had scans done at [facility] when he went to the ED complaining of chest pain.  A CT Angiogram Chest was performed but it does not look like a CT Abdomen was done. It was believed the chest pain was secondary to acid reflux with his medication, so he has been taking two Prilosec daily. He denies any more chest pain.  He received flu shot, pneumonia shot, and tetanus shot. He received the tetanus shot after stepping on a nail and not realizing it since he has no feeling in his feet. He had neck stiffness after this shot. His wife also reports he has no feeling in his hands.   After speaking with a neuropathy specialist he would like to switch back to Neurontin because Lyrica is so expensive. He was previously taking 6 Neurontin daily. His wife notes he is in the donut-hole with insurance. Xarelto and lyrica together are simply too expensive.   He denies abdominal pain. He reports occasional right chest wall soreness, he believes this is secondary to physical activity.  No change in  appetite. Energy is the same No change in bowel habits.    Rectal cancer ([place])   07/24/2010 Initial Diagnosis    Invasive adenocarcinoma of rectum      09/09/2010 Concurrent Chemotherapy    S/P radiation and concurrent 5-FU continuous infusion from 09/09/10- 10/10/10.      11/14/2010 Surgery    S/P proctectomy with colorectal anastomosis and diverting loop ileostomy on 11/14/10 at [facility] by Dr. Harlon Ditty. Pathology reveals a pT3b N1 with 3/20 lymph nodes.      02/05/2011 - 07/14/2011 Chemotherapy    FOLFOX      08/18/2011 Surgery    Approximate date of surgery- [place] by Dr. Harlon Ditty       Remission         11/24/2013 Survivorship    Colonoscopy- somewhat fibrotic/friable anastomotic mucosal-status post biopsy (narrowing not felt to be clinically significant).  Negative pathology for malignancy      09/12/2014 Imaging    DVT in the left femoral venous system, left common iliac vein, IVC, and within the IVC filter Right lower extremity venography confirms chronic occlusion of the femoral venous system with collateralization. The right iliac venous system is patent and do      09/25/2014 PET scan    The right middle lobe pulmonary nodule is hypermetabolic, favored to represent a primary bronchogenic carcinoma.Equivocal mediastinal nodes, similar to surrounding blood pool. Bilateral adrenal hypermetabolism, felt to be physiologic      10/24/2014 Pathology Results    Lung, needle/core biopsy(ies), RML - ADENOCARCINOMA,  SEE COMMENT metastatic adenocarcinoma of a colorectal primary      10/24/2014 Relapse/Recurrence         11/16/2014 Definitive Surgery    Bronchoscopy, right video-assisted thoracoscopy, wedge resection of right middle lobe by Dr. Servando Snare      11/16/2014 Pathology Results    Lung, wedge biopsy/resection, right lingula and small portion of middle lobe - METASTATIC ADENOCARCINOMA, CONSISTENT WITH COLORECTAL PRIMARY, SPANNING 2.0 CM. - THE SURGICAL RESECTION  MARGINS ARE NEGATIVE FOR ADENOCARCINOMA.      11/16/2014 Remission    THE SURGICAL RESECTION MARGINS ARE NEGATIVE FOR ADENOCARCINOMA.      03/07/2015 Imaging    CT CAP- Interval resection of right middle lobe metastasis. No acute process or evidence of metastatic disease in the chest, abdomen or pelvis. Improved right upper lobe reticular nodular opacities are favored to represent resolving infection.      09/14/2015 Imaging    CT CAP- No findings of recurrent malignancy. No recurrence along the wedge resection site of the right middle lobe. Right anterior abdominal wall focal hernia containing a knuckle of small bowel without complicating feature.        Past Medical History:  Diagnosis Date  . Chronic anticoagulation   . Colon cancer ([place]) 07/24/10   rectal ca, inv adenocarcinoma  . Depression 04/01/2011  . DVT (deep venous thrombosis) ([place]) 05/09/2011  . GERD (gastroesophageal reflux disease)   . High output ileostomy ([place]) 05/21/2011  . HTN (hypertension)   . Hx of radiation therapy 09/02/10 to 10/14/10   pelvis  . Lung metastasis ([place])   . Peripheral vascular disease ([place])    dvt's,pe  . Pneumonia    hx x3  . Pulmonary embolism ([place])   . Rectal cancer ([place]) 01/15/2011   S/P radiation and concurrent 5-FU continuous infusion from 09/09/10- 10/10/10.  S/P proctectomy with colorectal anastomosis and diverting loop ileostomy on 11/14/10 at [facility] by Dr. Harlon Ditty. Pathology reveals a pT3b N1 with 3/20 lymph nodes.      has GERD; Rectal cancer ([place]); Hx of radiation therapy; Stiffness of joint, not elsewhere classified, ankle and foot; Weakness of both legs; Peripheral neuropathy due to oxaliplatin-chemotherapy; Diarrhea; Acute pulmonary embolism ([place]); Lactic acidosis; Lower abdominal pain; Urinary retention; Lung nodule, solitary; DVT (deep venous thrombosis) ([place]); Lung nodule; S/P partial lobectomy of lung; GI bleed; Chronic anticoagulation; Constipation; Heme positive stool;  Gastritis; S/P thoracotomy; History of rectal cancer; Hiatal hernia; and Schatzki's ring on his problem list.     is allergic to oxycodone; tramadol; and trazodone and nefazodone.  Charles [name] had no medications administered during this visit.  Past Surgical History:  Procedure Laterality Date  . COLON SURGERY  11/14/2010   proctectomy with colorectal anastomosis and diverting loop ileostomy (temporary planned)  . COLONOSCOPY  07/2010   proximal rectal apple core mass 10-14cm from anal verge (adenocarcinoma), 2-3cm distal rectal carpet polyp s/p piecemeal snare polypectomy (adenoma)  . COLONOSCOPY  04/21/2012   RMR: Friable,fibrotic appearing colorectal anastomosis producing some luminal narrowing-not felt to be critical. path: focal erosion with slight inflammation and hyperemia. SURVEILLANCE DUE DEC 2015  . COLONOSCOPY N/A 11/24/2013   Dr. Rourk:somewhat fibrotic/friable anastomotic mucosal-status post biopsy (narrowing not felt to be clinically significant) Single colonic diverticulum. benign polypoid rectal mucosa  . colostomy reversal  april 2013  . ESOPHAGOGASTRODUODENOSCOPY  07/2010   RMR: schatki ring s/p dilation, small hh, SB bx benign  . ESOPHAGOGASTRODUODENOSCOPY N/A 09/04/2015   Procedure: ESOPHAGOGASTRODUODENOSCOPY (EGD);  Surgeon: Daneil Dolin,  MD;  Location: [facility];  Service: Endoscopy;  Laterality: N/A;  730  . EUS  08/2010   Dr. Owens Loffler. uT3N0 circumferential, nearly obstruction rectosigmoid adenocarcinoma, distal edge 12cm from anal verge  . HERNIA REPAIR     abd hernia repair  . IVC filter    . ivc filter    . port a cath placement    . PORT-A-CATH REMOVAL  09/24/2011   Procedure: REMOVAL PORT-A-CATH;  Surgeon: Donato Heinz, MD;  Location: [facility] ORS;  Service: General;  Laterality: N/A;  Minor Room  . VIDEO ASSISTED THORACOSCOPY (VATS)/WEDGE RESECTION Right 11/15/2014   Procedure: VIDEO ASSISTED THORACOSCOPY (VATS)/LUNG RESECTION WITH RIGHT LINGULECTOMY;   Surgeon: [name], MD;  Location: [place];  Service: Thoracic;  Laterality: Right;  Marland Kitchen VIDEO BRONCHOSCOPY N/A 11/15/2014   Procedure: VIDEO BRONCHOSCOPY;  Surgeon: [name], MD;  Location: [facility];  Service: Thoracic;  Laterality: N/A;    Review of Systems  Constitutional: Negative.   HENT: Negative.   Eyes: Negative.   Respiratory: Negative.   Cardiovascular: Negative.  Negative for chest pain.       Right chest wall soreness after physical activity  Gastrointestinal: Negative.  Negative for abdominal pain.  Genitourinary: Negative.   Musculoskeletal: Negative.   Skin: Negative.   Neurological: Negative.   Endo/Heme/Allergies: Negative.   Psychiatric/Behavioral: Negative.   All other systems reviewed and are negative. 14 point review of systems was performed and is negative except as detailed under history of present illness and above   PHYSICAL EXAMINATION  ECOG PERFORMANCE STATUS: 0 - Asymptomatic  Vitals:   03/17/16 0930  BP: (!) 157/76  Pulse: 70  Resp: 16  Temp: 98 F (36.7 C)   Physical Exam  Constitutional: He is oriented to person, place, and time and well-developed, well-nourished, and in no distress.  HENT:  Head: Normocephalic and atraumatic.  Nose: Nose normal.  Mouth/Throat: Oropharynx is clear and moist. No oropharyngeal exudate.  Eyes: Conjunctivae and EOM are normal. Pupils are equal, round, and reactive to light. Right eye exhibits no discharge. Left eye exhibits no discharge. No scleral icterus.  Neck: Normal range of motion. Neck supple. No tracheal deviation present. No thyromegaly present.  Cardiovascular: Normal rate, regular rhythm and normal heart sounds.  Exam reveals no gallop and no friction rub.   No murmur heard. Pulmonary/Chest: Effort normal and breath sounds normal. He has no wheezes. He has no rales.  Abdominal: Soft. Bowel sounds are normal. He exhibits no distension and no mass. There is no tenderness. There is no rebound  and no guarding.  Musculoskeletal: Normal range of motion. He exhibits no edema.  Lymphadenopathy:    He has no cervical adenopathy.  Neurological: He is alert and oriented to person, place, and time. He has normal reflexes. No cranial nerve deficit. Gait normal. Coordination normal.  Skin: Skin is warm and dry. No rash noted.  Psychiatric: Mood, memory, affect and judgment normal.  Nursing note and vitals reviewed.   LABORATORY DATA: I have reviewed the data as listed. Results for Charles [name], Charles [name] (MRN [id number]) as of 03/17/2016 10:24  Ref. Range 03/17/2016 08:32  Sodium Latest Ref Range: 135 - 145 mmol/L 138  Potassium Latest Ref Range: 3.5 - 5.1 mmol/L 4.5  Chloride Latest Ref Range: 101 - 111 mmol/L 105  CO2 Latest Ref Range: 22 - 32 mmol/L 28  BUN Latest Ref Range: 6 - 20 mg/dL 14  Creatinine Latest Ref Range: 0.61 - 1.24  mg/dL 0.89  Calcium Latest Ref Range: 8.9 - 10.3 mg/dL 8.8 (L)  EGFR (Non-African Amer.) Latest Ref Range: >60 mL/min >60  EGFR (African American) Latest Ref Range: >60 mL/min >60  Glucose Latest Ref Range: 65 - 99 mg/dL 205 (H)  Anion gap Latest Ref Range: 5 - 15  5  Alkaline Phosphatase Latest Ref Range: 38 - 126 U/L 80  Albumin Latest Ref Range: 3.5 - 5.0 g/dL 3.7  AST Latest Ref Range: 15 - 41 U/L 15  ALT Latest Ref Range: 17 - 63 U/L 17  Total Protein Latest Ref Range: 6.5 - 8.1 g/dL 6.7  Total Bilirubin Latest Ref Range: 0.3 - 1.2 mg/dL 1.1  WBC Latest Ref Range: 4.0 - 10.5 K/uL 6.3  RBC Latest Ref Range: 4.22 - 5.81 MIL/uL 5.04  Hemoglobin Latest Ref Range: 13.0 - 17.0 g/dL 14.8  HCT Latest Ref Range: 39.0 - 52.0 % 43.8  MCV Latest Ref Range: 78.0 - 100.0 fL 86.9  MCH Latest Ref Range: 26.0 - 34.0 pg 29.4  MCHC Latest Ref Range: 30.0 - 36.0 g/dL 33.8  RDW Latest Ref Range: 11.5 - 15.5 % 13.0  Platelets Latest Ref Range: 150 - 400 K/uL 218  Neutrophils Latest Units: % 57  Lymphocytes Latest Units: % 34  Monocytes Relative Latest Units: % 6    Eosinophil Latest Units: % 2  Basophil Latest Units: % 1  NEUT# Latest Ref Range: 1.7 - 7.7 K/uL 3.7  Lymphocyte # Latest Ref Range: 0.7 - 4.0 K/uL 2.1  Monocyte # Latest Ref Range: 0.1 - 1.0 K/uL 0.4  Eosinophils Absolute Latest Ref Range: 0.0 - 0.7 K/uL 0.1  Basophils Absolute Latest Ref Range: 0.0 - 0.1 K/uL 0.0   RADIOLOGY: I have personally reviewed the radiological images as listed and agreed with the findings in the report. Study Result        ASSESSMENT AND PLAN:  Stage IV CRC s/p surgical resection of solitary pulmonary nodule on 11/16/2014 Bronchoscopy, R VATS, wedge resection RML 11/16/2014 with Dr. Servando Snare History of Stage III CRC Treatment related neuropathy History of IVC filter in 2012 Acute DVT/PE 09/2014, s/p catheter lysis of DVT XARELTO Anxiety on Lexapro GERD   Since our last visit, the patient presented to the ED at [facility] complaining of chest pain. He underwent a CT Angiogram Chest, but did not have a CT Abdomen performed. It was believed the chest pain was secondary to acid reflux and he was advised to take Prilosec twice daily. He denies any further chest pain. Copy of the scans noted above. Images are available in EPIC and reviewed. No evidence of malignancy.   I have set him up for a CT Abdomen in 6 months, one year from his last scans.  The patient is requesting to be switched back to Neurontin from Lyrica due to financial strains. He will finish his Lyrica then discontinue. He was advised to slowly increase dose of Neurontin when first taking it. I have written him a prescription for neurontin.   He will return for follow up in 3 months.  Orders Placed This Encounter  Procedures  . CBC with Differential    Standing Status:   Future    Standing Expiration Date:   03/17/2017  . Comprehensive metabolic panel    Standing Status:   Future    Standing Expiration Date:   03/17/2017  . CEA    Standing Status:   Future    Standing Expiration Date:    03/17/2017  Meds ordered this encounter  Medications  . gabapentin (NEURONTIN) 300 MG capsule    Sig: Take 2 capsules (600 mg total) by mouth 3 (three) times daily.    Dispense:  180 capsule    Refill:  6    All questions were answered. The patient knows to call the clinic with any problems, questions or concerns. We can certainly see the patient much sooner if necessary.   This document serves as a record of services personally performed by Ancil Linsey, MD. It was created on her behalf by Arlyce Harman, a trained medical scribe. The creation of this record is based on the scribe's personal observations and the provider's statements to them. This document has been checked and approved by the attending provider.  I have reviewed the above documentation for accuracy and completeness, and I agree with the above.  This note was electronically signed.   Kelby Fam. Whitney Muse, MD

## 2016-03-17 NOTE — Patient Instructions (Addendum)
[place] at [facility] Discharge Instructions  RECOMMENDATIONS MADE BY THE CONSULTANT AND ANY TEST RESULTS WILL BE SENT TO YOUR REFERRING PHYSICIAN.  You saw Dr.Penland today. Stop taking Lyrica. Neurontin prescription sent to [facility] in [place]. Follow up in 3 months with lab work. See Amy at checkout for appointments.  Thank you for choosing [place] at [facility] to provide your oncology and hematology care.  To afford each patient quality time with our provider, please arrive at least 15 minutes before your scheduled appointment time.   Beginning January 23rd 2017 lab work for the [facility] will be done in the  Main lab at [facility] on 1st floor. If you have a lab appointment with the [place] please come in thru the  Main Entrance and check in at the main information desk  You need to re-schedule your appointment should you arrive 10 or more minutes late.  We strive to give you quality time with our providers, and arriving late affects you and other patients whose appointments are after yours.  Also, if you no show three or more times for appointments you may be dismissed from the clinic at the providers discretion.     Again, thank you for choosing [facility].  Our hope is that these requests will decrease the amount of time that you wait before being seen by our physicians.       _____________________________________________________________  Should you have questions after your visit to [facility], please contact our office at [phone] between the hours of 8:30 a.m. and 4:30 p.m.  Voicemails left after 4:30 p.m. will not be returned until the following business day.  For prescription refill requests, have your pharmacy contact our office.         Resources For Cancer Patients and their Caregivers ? American Cancer Society: Can assist with transportation, wigs, general needs, runs Look  Good Feel Better.        (802) 354-1095 ? Cancer Care: Provides financial assistance, online support groups, medication/co-pay assistance.  1-800-813-HOPE 952-259-4842) ? [place] Assists [place] cancer patients and their families through emotional , educational and financial support.  581-042-7481 ? [place] DSS Where to apply for food stamps, Medicaid and utility assistance. 3393633253 ? [facility]: Transportation to medical appointments. (938)679-5768 ? Social Security Administration: May apply for disability if have a Stage IV cancer. 985-152-1555 (432)290-4835 ? [employer], Disability and Transit Services: Assists with nutrition, care and transit needs. [place] Support Programs: '@10RELATIVEDAYS'$ @ > Cancer Support Group  2nd Tuesday of the month 1pm-2pm, Journey Room  > Creative Journey  3rd Tuesday of the month 1130am-1pm, Journey Room  > Look Good Feel Better  1st Wednesday of the month 10am-12 noon, Journey Room (Call [place] to register [phone])

## 2016-03-18 LAB — CEA: CEA: 2 ng/mL (ref 0.0–4.7)

## 2016-05-19 DIAGNOSIS — N529 Male erectile dysfunction, unspecified: Secondary | ICD-10-CM | POA: Diagnosis not present

## 2016-05-19 DIAGNOSIS — E1165 Type 2 diabetes mellitus with hyperglycemia: Secondary | ICD-10-CM | POA: Diagnosis not present

## 2016-05-19 DIAGNOSIS — C189 Malignant neoplasm of colon, unspecified: Secondary | ICD-10-CM | POA: Diagnosis not present

## 2016-05-19 DIAGNOSIS — C3490 Malignant neoplasm of unspecified part of unspecified bronchus or lung: Secondary | ICD-10-CM | POA: Diagnosis not present

## 2016-05-19 DIAGNOSIS — G4700 Insomnia, unspecified: Secondary | ICD-10-CM | POA: Diagnosis not present

## 2016-05-19 DIAGNOSIS — Z299 Encounter for prophylactic measures, unspecified: Secondary | ICD-10-CM | POA: Diagnosis not present

## 2016-05-19 DIAGNOSIS — Z789 Other specified health status: Secondary | ICD-10-CM | POA: Diagnosis not present

## 2016-05-19 DIAGNOSIS — Z713 Dietary counseling and surveillance: Secondary | ICD-10-CM | POA: Diagnosis not present

## 2016-05-19 DIAGNOSIS — Z6825 Body mass index (BMI) 25.0-25.9, adult: Secondary | ICD-10-CM | POA: Diagnosis not present

## 2016-05-19 DIAGNOSIS — R5383 Other fatigue: Secondary | ICD-10-CM | POA: Diagnosis not present

## 2016-05-19 DIAGNOSIS — I2699 Other pulmonary embolism without acute cor pulmonale: Secondary | ICD-10-CM | POA: Diagnosis not present

## 2016-05-19 DIAGNOSIS — Z79899 Other long term (current) drug therapy: Secondary | ICD-10-CM | POA: Diagnosis not present

## 2016-05-20 DIAGNOSIS — E1165 Type 2 diabetes mellitus with hyperglycemia: Secondary | ICD-10-CM | POA: Diagnosis not present

## 2016-05-23 ENCOUNTER — Telehealth (HOSPITAL_COMMUNITY): Payer: Self-pay

## 2016-05-23 NOTE — Telephone Encounter (Signed)
Patient called and he reports that he has a pain in his side where he had his surgery.  He also reports that he is moving 600 pound hay bales by hand and has been working hard over the past couple of days.  Robynn Pane, PA made aware of this and the patient's symptoms and he instructs for patient to take advil and call us back if this issues doesn't resolve in about a week.

## 2016-05-23 NOTE — Telephone Encounter (Signed)
-----   Message from Louis Meckel sent at 05/23/2016 10:45 AM EST ----- Regarding: call Patients wife called and stated patient not feeling well,please call her to discuss this  Thanks

## 2016-05-26 ENCOUNTER — Telehealth (HOSPITAL_COMMUNITY): Payer: Self-pay | Admitting: *Deleted

## 2016-05-26 NOTE — Telephone Encounter (Signed)
Called and spoke with patients wife. She states patient has lost from 219 pounds to 196 pounds since November. He was recently diagnosed with diabetes and is on glimepiride.he has an appointment with Dr. Whitney Muse on 06/17/16 but wants to be seen sooner because he is sure his cancer is back and that it is fast growing. He has had no changes in appetite, no nausea and vomiting, and no changes in bowel movements. He c/o being weak and having no energy which could be from the diabetes. After reviewing with PA-C, CT scans will be rescheduled to before his next appointment. Wife made aware that scheduler will call with new CT appt. She verbalized understanding.

## 2016-05-30 ENCOUNTER — Ambulatory Visit (INDEPENDENT_AMBULATORY_CARE_PROVIDER_SITE_OTHER): Payer: Medicare Other | Admitting: Nurse Practitioner

## 2016-05-30 ENCOUNTER — Encounter: Payer: Self-pay | Admitting: Nurse Practitioner

## 2016-05-30 VITALS — BP 139/76 | HR 84 | Temp 98.5°F | Ht 76.0 in | Wt 206.6 lb

## 2016-05-30 DIAGNOSIS — K219 Gastro-esophageal reflux disease without esophagitis: Secondary | ICD-10-CM | POA: Diagnosis not present

## 2016-05-30 DIAGNOSIS — K6289 Other specified diseases of anus and rectum: Secondary | ICD-10-CM

## 2016-05-30 DIAGNOSIS — R634 Abnormal weight loss: Secondary | ICD-10-CM | POA: Insufficient documentation

## 2016-05-30 DIAGNOSIS — Z85048 Personal history of other malignant neoplasm of rectum, rectosigmoid junction, and anus: Secondary | ICD-10-CM

## 2016-05-30 NOTE — Assessment & Plan Note (Signed)
GERD symptoms much better controlled on twice a day Prilosec. Continued twice a day PPI. Continue to monitor. Return for follow-up in 6 months.

## 2016-05-30 NOTE — Progress Notes (Signed)
CC'ED TO PCP 

## 2016-05-30 NOTE — Assessment & Plan Note (Signed)
Significantly improved with nitroglycerin 0.125% rectal cream. He is asking for a refill for rare breakthrough rectal pain associated with dietary indiscretion. Continue to monitor. Medicine refill sent to the pharmacy. Return for follow-up in 6 months.

## 2016-05-30 NOTE — Progress Notes (Signed)
Referring Provider: [name], MD Primary Care Physician:  [name], MD Primary GI:  Dr. [name]  Chief Complaint  Patient presents with  . Gastroesophageal Reflux    HPI:   Charles [name] is a 66 y.o. male who presents for follow-up. The patient was last seen in our office 12/11/2015 for an full bowel movements and constipation as well as suboptimal control of GERD. Dexilant controlled his symptoms well but the cost was too high and he went back to omeprazole which is not optimally controlling his symptoms. He was given nitroglycerin 0.125% ointment 3 times a day, Colace daily, MiraLAX at bedtime as needed, increase Prilosec to twice daily, requested progress report in one week. He called one week later does say he was doing much better and the medicine helped significantly.  He is on recall for repeat colonoscopy for rectal cancer August 2018.  Today he states he's doing ok overall. Has had issues with his blood sugars recently and they are titrating his medications, which is helping. GERD well controlled on bid omeprazole. Rectal cream helped his symptoms significantly, is requesting a refill for breakthrough symptoms (typically with dietary discretion). Will have some dyspnea for anziety. Admits 14 pound weight loss in the past 2 months, has an appointment with oncology to follow-up. He thinks this is possibly related to uncontrolled diabetes and about a 1 week episode of clearning his bowel frequently. Denies abdominal pain, N/V, hematochezia, melena, fever, chill. Denies chest pain, dizziness, lightheadedness, syncope, near syncope. Denies any other upper or lower GI symptoms.  Past Medical History:  Diagnosis Date  . Chronic anticoagulation   . Colon cancer ([place]) 07/24/10   rectal ca, inv adenocarcinoma  . Depression 04/01/2011  . DVT (deep venous thrombosis) ([place]) 05/09/2011  . GERD (gastroesophageal reflux disease)   . High output ileostomy ([place]) 05/21/2011  . HTN  (hypertension)   . Hx of radiation therapy 09/02/10 to 10/14/10   pelvis  . Lung metastasis ([place])   . Peripheral vascular disease ([place])    dvt's,pe  . Pneumonia    hx x3  . Pulmonary embolism ([place])   . Rectal cancer ([place]) 01/15/2011   S/P radiation and concurrent 5-FU continuous infusion from 09/09/10- 10/10/10.  S/P proctectomy with colorectal anastomosis and diverting loop ileostomy on 11/14/10 at [facility] by Dr. Harlon Ditty. Pathology reveals a pT3b N1 with 3/20 lymph nodes.      Past Surgical History:  Procedure Laterality Date  . COLON SURGERY  11/14/2010   proctectomy with colorectal anastomosis and diverting loop ileostomy (temporary planned)  . COLONOSCOPY  07/2010   proximal rectal apple core mass 10-14cm from anal verge (adenocarcinoma), 2-3cm distal rectal carpet polyp s/p piecemeal snare polypectomy (adenoma)  . COLONOSCOPY  04/21/2012   RMR: Friable,fibrotic appearing colorectal anastomosis producing some luminal narrowing-not felt to be critical. path: focal erosion with slight inflammation and hyperemia. SURVEILLANCE DUE DEC 2015  . COLONOSCOPY N/A 11/24/2013   Dr. Rourk:somewhat fibrotic/friable anastomotic mucosal-status post biopsy (narrowing not felt to be clinically significant) Single colonic diverticulum. benign polypoid rectal mucosa  . colostomy reversal  april 2013  . ESOPHAGOGASTRODUODENOSCOPY  07/2010   RMR: schatki ring s/p dilation, small hh, SB bx benign  . ESOPHAGOGASTRODUODENOSCOPY N/A 09/04/2015   Procedure: ESOPHAGOGASTRODUODENOSCOPY (EGD);  Surgeon: Daneil Dolin, MD;  Location: [facility];  Service: Endoscopy;  Laterality: N/A;  730  . EUS  08/2010   Dr. Owens Loffler. uT3N0 circumferential, nearly obstruction rectosigmoid adenocarcinoma,  distal edge 12cm from anal verge  . HERNIA REPAIR     abd hernia repair  . IVC filter    . ivc filter    . port a cath placement    . PORT-A-CATH REMOVAL  09/24/2011   Procedure: REMOVAL PORT-A-CATH;  Surgeon: Donato Heinz, MD;  Location: [facility] ORS;  Service: General;  Laterality: N/A;  Minor Room  . VIDEO ASSISTED THORACOSCOPY (VATS)/WEDGE RESECTION Right 11/15/2014   Procedure: VIDEO ASSISTED THORACOSCOPY (VATS)/LUNG RESECTION WITH RIGHT LINGULECTOMY;  Surgeon: [name], MD;  Location: [facility] [facility];  Service: Thoracic;  Laterality: Right;  Marland Kitchen VIDEO BRONCHOSCOPY N/A 11/15/2014   Procedure: VIDEO BRONCHOSCOPY;  Surgeon: [name], MD;  Location: [facility] OR;  Service: Thoracic;  Laterality: N/A;    Current Outpatient Prescriptions  Medication Sig Dispense Refill  . gabapentin (NEURONTIN) 300 MG capsule Take 2 capsules (600 mg total) by mouth 3 (three) times daily. 180 capsule 6  . glimepiride (AMARYL) 2 MG tablet Take 2 mg by mouth daily with breakfast.    . omeprazole (PRILOSEC) 20 MG capsule Take 20 mg by mouth 2 (two) times daily before a meal.     . psyllium (METAMUCIL) 58.6 % powder Take 1 packet by mouth daily.     . rivaroxaban (XARELTO) 20 MG TABS tablet Take 1 tablet (20 mg total) by mouth daily with supper. 30 tablet 3  . tamsulosin (FLOMAX) 0.4 MG CAPS capsule Take 0.4 mg by mouth.    . traZODone (DESYREL) 100 MG tablet Take 100 mg by mouth at bedtime.    . vitamin B-12 (CYANOCOBALAMIN) 1000 MCG tablet Take 1,000 mcg by mouth daily.    . vitamin E 400 UNIT capsule Take 400 Units by mouth daily.     No current facility-administered medications for this visit.    Facility-Administered Medications Ordered in Other Visits  Medication Dose Route Frequency Provider Last Rate Last Dose  . sodium chloride 0.9 % injection 10 mL  10 mL Intravenous PRN Everardo All, MD   10 mL at 08/22/11 1114    Allergies as of 05/30/2016 - Review Complete 05/30/2016  Allergen Reaction Noted  . Oxycodone  11/23/2014  . Tramadol  11/23/2014  . Trazodone and nefazodone Other (See Comments) 09/08/2014    Family History  Problem Relation Age of Onset  . Cancer Brother     throat  . Cancer Brother     prostate   . Colon cancer Neg Hx   . Liver disease Neg Hx   . Inflammatory bowel disease Neg Hx     Social History   Social History  . Marital status: Married    Spouse name: N/A  . Number of children: 2  . Years of education: N/A   Occupational History  . 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 Smoking status: Never Smoker  . Smokeless tobacco: Never Used  . Alcohol use No  . Drug use: No  . Sexual activity: Not Asked     Comment: married   Other Topics Concern  . None   Social History Narrative  . None    Review of Systems: General: Negative for anorexia, fever, chills, fatigue, weakness. Eyes: Negative for vision changes.  ENT: Negative for hoarseness, difficulty swallowing. CV: Negative for chest pain, angina, palpitations, peripheral edema.  Respiratory: Negative for dyspnea at rest, cough, sputum, wheezing.  GI: See history of present illness. Endo: Negative for unusual weight  change.  Heme: Negative for bruising or bleeding. Allergy: Negative for rash or hives.   Physical Exam: BP 139/76   Pulse 84   Temp 98.5 F (36.9 C) (Oral)   Ht '6\' 4"'$  (1.93 m)   Wt 206 lb 9.6 oz (93.7 kg)   BMI 25.15 kg/m  General:   Alert and oriented. Pleasant and cooperative. Well-nourished and well-developed.  Ears:  Normal auditory acuity. Cardiovascular:  S1, S2 present without murmurs appreciated. Normal pulses noted. Extremities without clubbing or edema. Respiratory:  Clear to auscultation bilaterally. No wheezes, rales, or rhonchi. No distress.  Gastrointestinal:  +BS, soft, non-tender and non-distended. No HSM noted. No guarding or rebound. No masses appreciated.  Rectal:  Deferred  Musculoskalatal:  Symmetrical without gross deformities. Neurologic:  Alert and oriented x4;  grossly normal neurologically. Psych:  Alert and cooperative. Normal mood and affect. Heme/Lymph/Immune: No excessive bruising  noted.    05/30/2016 11:13 AM   Disclaimer: This note was dictated with voice recognition software. Similar sounding words can inadvertently be transcribed and may not be corrected upon review.

## 2016-05-30 NOTE — Patient Instructions (Signed)
1. I sent the rectal cream to the pharmacy. They will call you when it is ready. 2. Continue taking your acid blocker. 3. Return for follow-up in 6 months to schedule your colonoscopy.

## 2016-05-30 NOTE — Assessment & Plan Note (Signed)
The patient noted a 14 pound weight loss in the past 2 months. Thinks it is related to his abnormal blood sugars as well as "having a lot of bowel movements." He has not coming appointment with oncology for imaging. Continue to monitor.

## 2016-05-30 NOTE — Assessment & Plan Note (Signed)
History of rectal cancer, following with oncology. He is due for surveillance colonoscopy in August of this year. I will bring him back in 6 months for an office visit to schedule this.

## 2016-06-10 ENCOUNTER — Encounter (HOSPITAL_COMMUNITY): Payer: Medicare Other | Attending: Oncology

## 2016-06-10 DIAGNOSIS — C20 Malignant neoplasm of rectum: Secondary | ICD-10-CM | POA: Diagnosis not present

## 2016-06-10 LAB — COMPREHENSIVE METABOLIC PANEL
ALT: 19 U/L (ref 17–63)
AST: 15 U/L (ref 15–41)
Albumin: 3.8 g/dL (ref 3.5–5.0)
Alkaline Phosphatase: 73 U/L (ref 38–126)
Anion gap: 5 (ref 5–15)
BUN: 11 mg/dL (ref 6–20)
CO2: 26 mmol/L (ref 22–32)
Calcium: 9 mg/dL (ref 8.9–10.3)
Chloride: 106 mmol/L (ref 101–111)
Creatinine, Ser: 0.71 mg/dL (ref 0.61–1.24)
GFR calc Af Amer: >60 mL/min (ref >60)
GFR calc non Af Amer: >60 mL/min (ref >60)
Glucose, Bld: 184 mg/dL — ABNORMAL HIGH (ref 65–99)
Potassium: 4.2 mmol/L (ref 3.5–5.1)
Sodium: 137 mmol/L (ref 135–145)
Total Bilirubin: 0.8 mg/dL (ref 0.3–1.2)
Total Protein: 6.4 g/dL — ABNORMAL LOW (ref 6.5–8.1)

## 2016-06-10 LAB — CBC WITH DIFFERENTIAL/PLATELET
Basophils Absolute: 0 10*3/uL (ref 0.0–0.1)
Basophils Relative: 0 %
Eosinophils Absolute: 0.1 10*3/uL (ref 0.0–0.7)
Eosinophils Relative: 2 %
HCT: 41 % (ref 39.0–52.0)
Hemoglobin: 14.1 g/dL (ref 13.0–17.0)
Lymphocytes Relative: 36 %
Lymphs Abs: 1.9 10*3/uL (ref 0.7–4.0)
MCH: 29.6 pg (ref 26.0–34.0)
MCHC: 34.4 g/dL (ref 30.0–36.0)
MCV: 86 fL (ref 78.0–100.0)
Monocytes Absolute: 0.4 10*3/uL (ref 0.1–1.0)
Monocytes Relative: 7 %
Neutro Abs: 2.9 10*3/uL (ref 1.7–7.7)
Neutrophils Relative %: 55 %
Platelets: 185 10*3/uL (ref 150–400)
RBC: 4.77 MIL/uL (ref 4.22–5.81)
RDW: 13.4 % (ref 11.5–15.5)
WBC: 5.2 10*3/uL (ref 4.0–10.5)

## 2016-06-11 LAB — CEA: CEA: 3.6 ng/mL (ref 0.0–4.7)

## 2016-06-13 ENCOUNTER — Telehealth: Payer: Self-pay

## 2016-06-13 ENCOUNTER — Other Ambulatory Visit: Payer: Self-pay

## 2016-06-13 ENCOUNTER — Ambulatory Visit (HOSPITAL_COMMUNITY)
Admission: RE | Admit: 2016-06-13 | Discharge: 2016-06-13 | Disposition: A | Discharge status: Home / Self Care | Payer: Medicare Other | Source: Ambulatory Visit | Attending: Oncology | Admitting: Oncology

## 2016-06-13 ENCOUNTER — Telehealth (HOSPITAL_COMMUNITY): Payer: Self-pay | Admitting: Oncology

## 2016-06-13 DIAGNOSIS — Q263 Partial anomalous pulmonary venous connection: Secondary | ICD-10-CM | POA: Diagnosis not present

## 2016-06-13 DIAGNOSIS — I2510 Atherosclerotic heart disease of native coronary artery without angina pectoris: Secondary | ICD-10-CM | POA: Insufficient documentation

## 2016-06-13 DIAGNOSIS — C782 Secondary malignant neoplasm of pleura: Secondary | ICD-10-CM | POA: Insufficient documentation

## 2016-06-13 DIAGNOSIS — R948 Abnormal results of function studies of other organs and systems: Secondary | ICD-10-CM

## 2016-06-13 DIAGNOSIS — K37 Unspecified appendicitis: Secondary | ICD-10-CM | POA: Insufficient documentation

## 2016-06-13 DIAGNOSIS — K8689 Other specified diseases of pancreas: Secondary | ICD-10-CM | POA: Insufficient documentation

## 2016-06-13 DIAGNOSIS — C20 Malignant neoplasm of rectum: Secondary | ICD-10-CM | POA: Insufficient documentation

## 2016-06-13 DIAGNOSIS — I700 Atherosclerosis of aorta: Secondary | ICD-10-CM | POA: Insufficient documentation

## 2016-06-13 MED ORDER — IOPAMIDOL (ISOVUE-300) INJECTION 61%
100.0000 mL | Freq: Once | INTRAVENOUS | Status: AC | PRN
  Administered 2016-06-13: 100 mL via INTRAVENOUS

## 2016-06-13 NOTE — Telephone Encounter (Signed)
Pt has been set up for 06/19/16 2 pm EUS [facility] EUS scheduled, pt instructed and medications reviewed.  Patient instructions mailed to home.  Patient to call with any questions or concerns.

## 2016-06-13 NOTE — Telephone Encounter (Signed)
06/13/2016   RE: Charles [name] DOB: May 07, 1950 MRN: [id number]   Dear Dr Woody Seller,    We have scheduled the above patient for an endoscopic procedure. Our records show that he is on anticoagulation therapy.   Please advise as to how long the patient may come off his therapy of Xarelto prior to the procedure, which is scheduled for 06/19/16.  Please fax back/ or route to [phone] to [place].   Sincerely,    Christian Mate RN    Please respond by 06/16/16 and route back to Christian Mate RN

## 2016-06-13 NOTE — Telephone Encounter (Signed)
Yes, he'll need to skip the xarelto the day prior to the EUS.  I cannot tell who is the prescribing MD, can you call him to find out and send anticoag info to them?  Thank you much.

## 2016-06-13 NOTE — Telephone Encounter (Signed)
Pt is on Xarelto, should he hold?

## 2016-06-13 NOTE — Telephone Encounter (Signed)
Patient's scans are reviewed in detail.  I have done the following: 1. Discussed the case with Dr. Brantley Stage ([facility]) for the appendical abnormality.  WBC on 2/6 was WNL.  Dr. Brantley Stage reports that he would not chase this imaging abnormality and he would follow patient clinically.  If clinical signs of appendicitis arise, then pursuing this issue would be appropriate.  2. I have messaged Dr. Ardis Hughs who agrees to pursue EUS.  He will work him in soon for this procedure with biopsy.  3. I have messaged IR for the pulmonary findings to see if there is something amenable to percutaneous biopsy.  I have not yet heard back from Dr. Geroge Baseman.  Patient has an appointment in the clinic on 2/13 at which time we will review erverything.  I have called the patient and spoke to he and his wife regarding the results, the aforementioned information, and his recent labs.  Of course they were upset with the results, but they were agreeable with the plan I provided them.  Robynn Pane, PA-C 06/13/2016 3:35 PM

## 2016-06-13 NOTE — Telephone Encounter (Signed)
-----   Message from Milus Banister, MD sent at 06/13/2016  2:10 PM EST ----- Gershon Mussel, Happy to help.  The findings in head, uncinate pancreas are subtle but Abigail Miyamoto is a very good body radiologist and so I'll defer to his reading.  Lung findings are more impressive to my eye, agree that he should undergo parallel workup with pulmonary.  Thursday the 15th is possible, so is Thursday the 22nd. Will depend on anesthesia availability to help. We'll start looking into it now.    Melis Trochez, See below. He needs upper EUS, radial +/- linear for abnormal pancreas.  I think I should be able to follow my 1pm case on Thursday 15th next week. IF not, then look at 10am spot on Thursday the 22nd.  Thanks  dj    ----- Message ----- From: Baird Cancer, PA-C Sent: 06/13/2016   1:30 PM To: Milus Banister, MD  Good afternoon,  Long story short, Stage III CRC, S/P resection followed by adjuvant therapy 2012/2013.  He then had recurrence of disease, biopsy proven to lung in 2016.  This was resected and only site of disease.  Patient opted against chemotherapy following resection and opted for observation (either option is standard of care).    His wife called reporting weight loss and "I know his cancer his back."  Imaging in May 2017 was normal.  Due to her concern, I brought him in sooner than planned for labs and moved his May 2018 scans up.  Labs show a normal CEA, but an upward trend.   CT imaging completed today is a MESS!  I have called [facility] for an opinion regarding the appendiceal tip.   They were unimpressed.  Patient is asymptomatic from this perspective (to the best of my knowledge and WBC is normal)  Lung findings will need worked up.  Pancreatic abnormality is noted.  Labs are unimpressive.  Can you EUS him with biopsy soon?  Super nice guy, very fit.  Once I hear from you, I will call the patient and let him know the results.  Just hoping to get a plan in place before calling  patient.  Thanks  TK [facility].

## 2016-06-16 ENCOUNTER — Encounter (HOSPITAL_COMMUNITY): Payer: Self-pay | Admitting: *Deleted

## 2016-06-16 NOTE — Telephone Encounter (Signed)
Pt wife states that the doctor did get in contact with pt and stated to stop BT today until after procedure and that Dr.Jacobs will tell pt when to start back.

## 2016-06-16 NOTE — Telephone Encounter (Signed)
I spoke with the pt and confirmed with her the instructions for xarelto and we discussed the instructions for the procedure.  She verbalized understanding and will call with any further questions

## 2016-06-17 ENCOUNTER — Ambulatory Visit (HOSPITAL_COMMUNITY)
Admission: RE | Admit: 2016-06-17 | Discharge: 2016-06-17 | Disposition: A | Discharge status: Home / Self Care | Payer: Medicare Other | Source: Ambulatory Visit | Attending: Oncology | Admitting: Oncology

## 2016-06-17 ENCOUNTER — Encounter (HOSPITAL_BASED_OUTPATIENT_CLINIC_OR_DEPARTMENT_OTHER): Payer: Medicare Other | Admitting: Oncology

## 2016-06-17 ENCOUNTER — Other Ambulatory Visit (HOSPITAL_COMMUNITY): Payer: Medicare Other

## 2016-06-17 VITALS — BP 139/80 | HR 71 | Temp 97.6°F | Resp 18 | Wt 207.4 lb

## 2016-06-17 DIAGNOSIS — C20 Malignant neoplasm of rectum: Secondary | ICD-10-CM | POA: Insufficient documentation

## 2016-06-17 DIAGNOSIS — Z85048 Personal history of other malignant neoplasm of rectum, rectosigmoid junction, and anus: Secondary | ICD-10-CM

## 2016-06-17 DIAGNOSIS — R42 Dizziness and giddiness: Secondary | ICD-10-CM | POA: Insufficient documentation

## 2016-06-17 DIAGNOSIS — R918 Other nonspecific abnormal finding of lung field: Secondary | ICD-10-CM

## 2016-06-17 DIAGNOSIS — I672 Cerebral atherosclerosis: Secondary | ICD-10-CM | POA: Diagnosis not present

## 2016-06-17 NOTE — Patient Instructions (Addendum)
[place] at [facility] Discharge Instructions  RECOMMENDATIONS MADE BY THE CONSULTANT AND ANY TEST RESULTS WILL BE SENT TO YOUR REFERRING PHYSICIAN.  Return in 2 weeks to see Dr. Talbert Cage  CT scan without contrast of head due to new onset of dizziness  IR consult for biopsy of right pleural mass  Endoscopic Ultrasound on Thursday   Thank you for choosing [place] at [facility] to provide your oncology and hematology care.  To afford each patient quality time with our provider, please arrive at least 15 minutes before your scheduled appointment time.    If you have a lab appointment with the [facility] please come in thru the  Main Entrance and check in at the main information desk  You need to re-schedule your appointment should you arrive 10 or more minutes late.  We strive to give you quality time with our providers, and arriving late affects you and other patients whose appointments are after yours.  Also, if you no show three or more times for appointments you may be dismissed from the clinic at the providers discretion.     Again, thank you for choosing [facility].  Our hope is that these requests will decrease the amount of time that you wait before being seen by our physicians.       _____________________________________________________________  Should you have questions after your visit to [facility], please contact our office at [phone] between the hours of 8:30 a.m. and 4:30 p.m.  Voicemails left after 4:30 p.m. will not be returned until the following business day.  For prescription refill requests, have your pharmacy contact our office.       Resources For Cancer Patients and their Caregivers ? American Cancer Society: Can assist with transportation, wigs, general needs, runs Look Good Feel Better.        (909) 352-9046 ? Cancer Care: Provides financial assistance, online support  groups, medication/co-pay assistance.  1-800-813-HOPE (507) 168-8602) ? [employer] Assists [place] cancer patients and their families through emotional , educational and financial support.  947-587-3774 ? [place] DSS Where to apply for food stamps, Medicaid and utility assistance. 4057259044 ? [facility]: Transportation to medical appointments. 563-706-5537 ? Social Security Administration: May apply for disability if have a Stage IV cancer. (432)674-2741 (661) 282-4351 ? [employer], Disability and Transit Services: Assists with nutrition, care and transit needs. [place] Support Programs: '@10RELATIVEDAYS'$ @ > Cancer Support Group  2nd Tuesday of the month 1pm-2pm, Journey Room  > Creative Journey  3rd Tuesday of the month 1130am-1pm, Journey Room  > Look Good Feel Better  1st Wednesday of the month 10am-12 noon, Journey Room (Call [facility] to register [phone])

## 2016-06-17 NOTE — Progress Notes (Signed)
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 [id number]  History of Stage III cancer of the rectum status post preoperative radiation and chemotherapy followed by definitive surgery. He then underwent adjuvant chemotherapy with FOLFOX. During the radiation therapy and continuous infusion 5-FU from 09/09/2010 through 10/10/2010. FOLFOX was administered from October 2012 through 07/14/2011  Stage IV CRC with solitary pulmonary metastases s/p surgical resection with Dr.Gerhardt  CURRENT THERAPY: Surveillance per NCCN guidelines   INTERVAL HISTORY: Charles [name] 66 y.o. male returns for followup of stage IV CRC.   Charles [name] is accompanied by his wife. I personally reviewed and went over laboratory studies and outside imaging studies with the patient. He had a restaging CT C/A/P performed on 06/13/17 which demonstrated a new right-sided pleural mass/metastasis measures 3.4 x 1.4 cm, a pleural-based 46m posterior right upper lobe pulmonary, new 2.4 cm soft tissue fullness in the pancreatic head and uncinate process, as well as enlargement of the appendiceal tail. Patient's scans were discussed with Dr. JArnoldo Moraleover the phone who did not believe patient had acute appendicitis, especially since patient is asymptomatic.    Complains of soreness on his right side of his thorax. Says when he comes home and sits on the sofa and raises his arms there is a pain that comes on his right side. He notes that it doesn't limit his activity.  Patient newly diagnosed with diabetes in Jan 2018. Also states he's been having light colored stools.  States weight loss. Lost about 15 lbs since late November. Appetite is great but keeps losing weight. Denies appendix pain in lower right abdomen.  Wife says he stays real weak at times, and at times he is very dizzy and staggers around. Notes this has been going on for about 3 weeks.       Rectal cancer ([place]   07/24/2010 Initial Diagnosis      Invasive adenocarcinoma of rectum      09/09/2010 Concurrent Chemotherapy    S/P radiation and concurrent 5-FU continuous infusion from 09/09/10- 10/10/10.      11/14/2010 Surgery    S/P [facility]ct[facility] with colorectal [facility]osis [facility] Pathology reveals a pT3b N1 with 3/20 lymph nodes.      02/05/2011 - 07/14/2011 Chemotherapy    FOLFOX      08/18/2011 Surgery    Approximate date of surgery- [place] by Dr. MHarlon Ditty      Remission         11/24/2013 Survivorship    Colonoscopy- somewhat fibrotic/friable anastomotic mucosal-status post biopsy (narrowing not felt to be clinically significant).  Negative pathology for malignancy      09/12/2014 Imaging    DVT in the left femoral venous system, left common iliac vein, IVC, and within the IVC filter Right lower extremity venography confirms chronic occlusion of the femoral venous system with collateralization. The right iliac venous system is patent and do      09/25/2014 PET scan    The right middle lobe pulmonary nodule is hypermetabolic, favored to represent a primary bronchogenic carcinoma.Equivocal mediastinal nodes, similar to surrounding blood pool. Bilateral adrenal hypermetabolism, felt to be physiologic      10/24/2014 Pathology Results    Lung, needle/core biopsy(ies), RML - ADENOCARCINOMA, SEE COMMENT metastatic adenocarcinoma of a colorectal primary      10/24/2014 Relapse/Recurrence         11/16/2014 Definitive  Surgery    Bronchoscopy, right video-assisted thoracoscopy, wedge resection of right middle lobe by Dr. Servando Snare      11/16/2014 Pathology Results    Lung, wedge biopsy/resection, right lingula and small portion of middle lobe - METASTATIC ADENOCARCINOMA, CONSISTENT WITH COLORECTAL PRIMARY, SPANNING 2.0 CM. - THE SURGICAL RESECTION MARGINS ARE NEGATIVE FOR ADENOCARCINOMA.      11/16/2014 Remission    THE SURGICAL RESECTION MARGINS ARE  NEGATIVE FOR ADENOCARCINOMA.      03/07/2015 Imaging    CT CAP- Interval resection of right middle lobe metastasis. No acute process or evidence of metastatic disease in the chest, abdomen or pelvis. Improved right upper lobe reticular nodular opacities are favored to represent resolving infection.      09/14/2015 Imaging    CT CAP- No findings of recurrent malignancy. No recurrence along the wedge resection site of the right middle lobe. Right anterior abdominal wall focal hernia containing a knuckle of small bowel without complicating feature.      06/13/2016 Imaging    CT CAP- 1. New right-sided pleural metastasis/mass. Other smaller right-sided pulmonary nodules which are all pleural-based and most likely represent pleural metastasis. 2. No convincing evidence of abdominopelvic nodal metastasis. 3. Constellation of findings, including pancreatic atrophy, duct dilatation, and pancreatic head soft tissue fullness which are highly suspicious for pancreatic adenocarcinoma. Metastatic disease felt much less likely. Consider endoscopic ultrasound sampling or ERCP. Cannot exclude superimposed acute pancreatitis. 4. New enlargement of the appendiceal tip with subtle surrounding edema. Cannot exclude early or mild appendicitis. 5. Coronary artery atherosclerosis. Aortic atherosclerosis. 6. 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      06/13/2016 Progression    CT scan demonstrates progression of disease        Past Medical History:  Diagnosis Date  . Chronic anticoagulation   . Colon cancer ([place]) 07/24/10   rectal ca, inv adenocarcinoma  . Depression 04/01/2011  . DVT (deep venous thrombosis) ([place]) 05/09/2011  . GERD (gastroesophageal reflux disease)   . High output ileostomy ([place]) 05/21/2011  . HTN (hypertension)   . Hx of radiation therapy 09/02/10 to 10/14/10   pelvis  . Lung metastasis ([place])   . Peripheral vascular disease ([place])    dvt's,pe  .  Pneumonia    hx x3  . Pulmonary embolism ([place])   . Rectal cancer ([place]) 01/15/2011   S/P radiation and concurrent 5-FU continuous infusion from 09/09/10- 10/10/10.  S/P proctectomy with colorectal anastomosis and diverting loop ileostomy on 11/14/10 at [facility] by Dr. Harlon Ditty. Pathology reveals a pT3b N1 with 3/20 lymph nodes.      has GERD; Rectal cancer ([place]); Hx of radiation therapy; Stiffness of joint, not elsewhere classified, ankle and foot; Weakness of both legs; Peripheral neuropathy due to oxaliplatin-chemotherapy; Diarrhea; Acute pulmonary embolism ([place]); Lactic acidosis; Lower abdominal pain; Urinary retention; Lung nodule, solitary; DVT (deep venous thrombosis) ([place]); Lung nodule; S/P partial lobectomy of lung; GI bleed; Chronic anticoagulation; Constipation; Heme positive stool; Gastritis; S/P thoracotomy; History of rectal cancer; Hiatal hernia; Schatzki's ring; Rectal pain; and Loss of weight on his problem list.     is allergic to oxycodone; tramadol; and trazodone and nefazodone.  Charles [name] had no medications administered during this visit.  Past Surgical History:  Procedure Laterality Date  . COLON SURGERY  11/14/2010   proctectomy with colorectal anastomosis and diverting loop ileostomy (temporary planned)  . COLONOSCOPY  07/2010   proximal rectal apple core mass 10-14cm from anal verge (adenocarcinoma),  2-3cm distal rectal carpet polyp s/p piecemeal snare polypectomy (adenoma)  . COLONOSCOPY  04/21/2012   RMR: Friable,fibrotic appearing colorectal anastomosis producing some luminal narrowing-not felt to be critical. path: focal erosion with slight inflammation and hyperemia. SURVEILLANCE DUE DEC 2015  . COLONOSCOPY N/A 11/24/2013   Dr. Rourk:somewhat fibrotic/friable anastomotic mucosal-status post biopsy (narrowing not felt to be clinically significant) Single colonic diverticulum. benign polypoid rectal mucosa  . colostomy reversal  april 2013  .  ESOPHAGOGASTRODUODENOSCOPY  07/2010   RMR: schatki ring s/p dilation, small hh, SB bx benign  . ESOPHAGOGASTRODUODENOSCOPY N/A 09/04/2015   Procedure: ESOPHAGOGASTRODUODENOSCOPY (EGD);  Surgeon: Daneil Dolin, MD;  Location: [facility];  Service: Endoscopy;  Laterality: N/A;  730  . EUS  08/2010   Dr. Owens Loffler. uT3N0 circumferential, nearly obstruction rectosigmoid adenocarcinoma, distal edge 12cm from anal verge  . HERNIA REPAIR     abd hernia repair  . IVC filter    . ivc filter    . port a cath placement    . PORT-A-CATH REMOVAL  09/24/2011   Procedure: REMOVAL PORT-A-CATH;  Surgeon: Donato Heinz, MD;  Location: [facility] ORS;  Service: General;  Laterality: N/A;  Minor Room  . VIDEO ASSISTED THORACOSCOPY (VATS)/WEDGE RESECTION Right 11/15/2014   Procedure: VIDEO ASSISTED THORACOSCOPY (VATS)/LUNG RESECTION WITH RIGHT LINGULECTOMY;  Surgeon: [name], MD;  Location: [place];  Service: Thoracic;  Laterality: Right;  Marland Kitchen VIDEO BRONCHOSCOPY N/A 11/15/2014   Procedure: VIDEO BRONCHOSCOPY;  Surgeon: [name], MD;  Location: [facility];  Service: Thoracic;  Laterality: N/A;    Review of Systems  Constitutional: Positive for weight loss (lost 14 pounds since Thanksgiving).  HENT: Negative.   Eyes: Negative.   Respiratory: Negative.   Cardiovascular: Positive for leg swelling (right leg).  Gastrointestinal: Negative.  Negative for abdominal pain.  Genitourinary: Negative.   Musculoskeletal: Negative.        Weakness in the legs Soreness on right side of axilla going down to his abdomen..  Skin: Negative.   Neurological: Positive for dizziness (intermittently) and weakness (intermittently).  Endo/Heme/Allergies: Negative.   Psychiatric/Behavioral: Negative.   All other systems reviewed and are negative. 14 point review of systems was performed and is negative except as detailed under history of present illness and above   PHYSICAL EXAMINATION  ECOG PERFORMANCE STATUS: 0 -  Asymptomatic  Vitals:   06/17/16 0955  BP: 139/80  Pulse: 71  Resp: 18  Temp: 97.6 F (36.4 C)    Filed Weights   06/17/16 0955  Weight: 207 lb 6.4 oz (94.1 kg)     Physical Exam  Constitutional: He is oriented to person, place, and time and well-developed, well-nourished, and in no distress.  HENT:  Head: Normocephalic and atraumatic.  Nose: Nose normal.  Mouth/Throat: Oropharynx is clear and moist. No oropharyngeal exudate.  Eyes: Conjunctivae and EOM are normal. Pupils are equal, round, and reactive to light. Right eye exhibits no discharge. Left eye exhibits no discharge. No scleral icterus.  Neck: Normal range of motion. Neck supple. No tracheal deviation present. No thyromegaly present.  Cardiovascular: Normal rate, regular rhythm and normal heart sounds.  Exam reveals no gallop and no friction rub.   No murmur heard. Pulmonary/Chest: Effort normal and breath sounds normal. He has no wheezes. He has no rales.  Abdominal: Soft. Bowel sounds are normal. He exhibits no distension and no mass. There is no tenderness. There is no rebound and no guarding.  Musculoskeletal: Normal range of motion.  He exhibits no edema.  Lymphadenopathy:    He has no cervical adenopathy.  Neurological: He is alert and oriented to person, place, and time. He has normal reflexes. No cranial nerve deficit. Gait normal. Coordination normal.  Skin: Skin is warm and dry. No rash noted.  Psychiatric: Mood, memory, affect and judgment normal.  Nursing note and vitals reviewed.   LABORATORY DATA: I have reviewed the data as listed. Results for Charles, [name] (MRN [id number]) as of 06/17/2016 09:12  Ref. Range 06/10/2016 08:15  Sodium Latest Ref Range: 135 - 145 mmol/L 137  Potassium Latest Ref Range: 3.5 - 5.1 mmol/L 4.2  Chloride Latest Ref Range: 101 - 111 mmol/L 106  CO2 Latest Ref Range: 22 - 32 mmol/L 26  Glucose Latest Ref Range: 65 - 99 mg/dL 184 (H)  BUN Latest Ref Range: 6 - 20 mg/dL 11   Creatinine Latest Ref Range: 0.61 - 1.24 mg/dL 0.71  Calcium Latest Ref Range: 8.9 - 10.3 mg/dL 9.0  Anion gap Latest Ref Range: 5 - 15  5  Alkaline Phosphatase Latest Ref Range: 38 - 126 U/L 73  Albumin Latest Ref Range: 3.5 - 5.0 g/dL 3.8  AST Latest Ref Range: 15 - 41 U/L 15  ALT Latest Ref Range: 17 - 63 U/L 19  Total Protein Latest Ref Range: 6.5 - 8.1 g/dL 6.4 (L)  Total Bilirubin Latest Ref Range: 0.3 - 1.2 mg/dL 0.8  EGFR (African American) Latest Ref Range: >60 mL/min >60  EGFR (Non-African Amer.) Latest Ref Range: >60 mL/min >60  WBC Latest Ref Range: 4.0 - 10.5 K/uL 5.2  RBC Latest Ref Range: 4.22 - 5.81 MIL/uL 4.77  Hemoglobin Latest Ref Range: 13.0 - 17.0 g/dL 14.1  HCT Latest Ref Range: 39.0 - 52.0 % 41.0  MCV Latest Ref Range: 78.0 - 100.0 fL 86.0  MCH Latest Ref Range: 26.0 - 34.0 pg 29.6  MCHC Latest Ref Range: 30.0 - 36.0 g/dL 34.4  RDW Latest Ref Range: 11.5 - 15.5 % 13.4  Platelets Latest Ref Range: 150 - 400 K/uL 185  Neutrophils Latest Units: % 55  Lymphocytes Latest Units: % 36  Monocytes Relative Latest Units: % 7  Eosinophil Latest Units: % 2  Basophil Latest Units: % 0  NEUT# Latest Ref Range: 1.7 - 7.7 K/uL 2.9  Lymphocyte # Latest Ref Range: 0.7 - 4.0 K/uL 1.9  Monocyte # Latest Ref Range: 0.1 - 1.0 K/uL 0.4  Eosinophils Absolute Latest Ref Range: 0.0 - 0.7 K/uL 0.1  Basophils Absolute Latest Ref Range: 0.0 - 0.1 K/uL 0.0   RADIOLOGY: I have personally reviewed the radiological images as listed and agreed with the findings in the report. Study Result   CT CHEST, ABDOMEN, AND PELVIS WITH CONTRAST 06/13/2016  IMPRESSION: 1. New right-sided pleural metastasis/mass. Other smaller right-sided pulmonary nodules which are all pleural-based and most likely represent pleural metastasis. 2. No convincing evidence of abdominopelvic nodal metastasis. 3. Constellation of findings, including pancreatic atrophy, duct dilatation, and pancreatic head soft tissue  fullness which are highly suspicious for pancreatic adenocarcinoma. Metastatic disease felt much less likely. Consider endoscopic ultrasound sampling or ERCP. Cannot exclude superimposed acute pancreatitis. 4. New enlargement of the appendiceal tip with subtle surrounding edema. Cannot exclude early or mild appendicitis. 5.  Coronary artery atherosclerosis. Aortic atherosclerosis. 6. 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    ASSESSMENT AND PLAN:  Stage IV CRC s/p surgical resection of solitary pulmonary nodule on 11/16/2014 Bronchoscopy, R VATS,  wedge resection RML 11/16/2014 with Dr. Servando Snare History of Stage III CRC Treatment related neuropathy History of IVC filter in 2012 Acute DVT/PE 09/2014, s/p catheter lysis of DVT XARELTO Anxiety on Lexapro GERD  I have reviewed patient's CT C/A/P in detail with the patient and his wife today. Suspect possible new primary with pancreatic CA with metastatic disease, however need tissue diagnosis. Check CA 19-9. He has a Upper Endoscopic ultrasound  scheduled on Thursday Jun 20, 2015 for biopsy of the pancreatic mass. I will send him to IR for biopsy of his right pleural mass. I will order a CT head without contrast to r/o metastatic brain disease given his new symptoms with persistent dizziness. He did not want to do an MRI because of his claustrophobia.   RTC in 2 weeks to review path reports and to discuss the next plan of care.    All questions were answered. The patient knows to call the clinic with any problems, questions or concerns. We can certainly see the patient much sooner if necessary.   This document serves as a record of services personally performed by Twana First, MD. It was created on her behalf by Shirlean Mylar, a trained medical scribe. The creation of this record is based on the scribe's personal observations and the provider's statements to them. This document has been checked and approved by  the attending provider.   I have reviewed the above documentation for accuracy and completeness, and I agree with the above.  This note was electronically signed.

## 2016-06-19 ENCOUNTER — Ambulatory Visit (HOSPITAL_COMMUNITY): Payer: Medicare Other | Admitting: Anesthesiology

## 2016-06-19 ENCOUNTER — Encounter (HOSPITAL_COMMUNITY): Payer: Self-pay | Admitting: *Deleted

## 2016-06-19 ENCOUNTER — Ambulatory Visit (HOSPITAL_COMMUNITY)
Admission: RE | Admit: 2016-06-19 | Discharge: 2016-06-19 | Disposition: A | Discharge status: Home / Self Care | Payer: Medicare Other | Source: Ambulatory Visit | Attending: Gastroenterology | Admitting: Gastroenterology

## 2016-06-19 ENCOUNTER — Other Ambulatory Visit (HOSPITAL_COMMUNITY): Payer: Self-pay | Admitting: Oncology

## 2016-06-19 ENCOUNTER — Encounter (HOSPITAL_COMMUNITY)
Admission: RE | Disposition: A | Discharge status: Home / Self Care | Payer: Self-pay | Source: Ambulatory Visit | Attending: Gastroenterology

## 2016-06-19 DIAGNOSIS — Z85038 Personal history of other malignant neoplasm of large intestine: Secondary | ICD-10-CM | POA: Diagnosis not present

## 2016-06-19 DIAGNOSIS — E1151 Type 2 diabetes mellitus with diabetic peripheral angiopathy without gangrene: Secondary | ICD-10-CM | POA: Insufficient documentation

## 2016-06-19 DIAGNOSIS — Z79899 Other long term (current) drug therapy: Secondary | ICD-10-CM | POA: Insufficient documentation

## 2016-06-19 DIAGNOSIS — I2510 Atherosclerotic heart disease of native coronary artery without angina pectoris: Secondary | ICD-10-CM | POA: Insufficient documentation

## 2016-06-19 DIAGNOSIS — Z923 Personal history of irradiation: Secondary | ICD-10-CM | POA: Insufficient documentation

## 2016-06-19 DIAGNOSIS — Z7984 Long term (current) use of oral hypoglycemic drugs: Secondary | ICD-10-CM | POA: Diagnosis not present

## 2016-06-19 DIAGNOSIS — R948 Abnormal results of function studies of other organs and systems: Secondary | ICD-10-CM

## 2016-06-19 DIAGNOSIS — I700 Atherosclerosis of aorta: Secondary | ICD-10-CM | POA: Diagnosis not present

## 2016-06-19 DIAGNOSIS — R933 Abnormal findings on diagnostic imaging of other parts of digestive tract: Secondary | ICD-10-CM | POA: Diagnosis not present

## 2016-06-19 DIAGNOSIS — Z86718 Personal history of other venous thrombosis and embolism: Secondary | ICD-10-CM | POA: Diagnosis not present

## 2016-06-19 DIAGNOSIS — Z85118 Personal history of other malignant neoplasm of bronchus and lung: Secondary | ICD-10-CM | POA: Insufficient documentation

## 2016-06-19 DIAGNOSIS — C257 Malignant neoplasm of other parts of pancreas: Secondary | ICD-10-CM | POA: Insufficient documentation

## 2016-06-19 DIAGNOSIS — K8689 Other specified diseases of pancreas: Secondary | ICD-10-CM | POA: Diagnosis not present

## 2016-06-19 DIAGNOSIS — I10 Essential (primary) hypertension: Secondary | ICD-10-CM | POA: Insufficient documentation

## 2016-06-19 DIAGNOSIS — Z9221 Personal history of antineoplastic chemotherapy: Secondary | ICD-10-CM | POA: Insufficient documentation

## 2016-06-19 DIAGNOSIS — Z86711 Personal history of pulmonary embolism: Secondary | ICD-10-CM | POA: Diagnosis not present

## 2016-06-19 DIAGNOSIS — Z7901 Long term (current) use of anticoagulants: Secondary | ICD-10-CM | POA: Diagnosis not present

## 2016-06-19 DIAGNOSIS — R918 Other nonspecific abnormal finding of lung field: Secondary | ICD-10-CM | POA: Diagnosis not present

## 2016-06-19 DIAGNOSIS — R634 Abnormal weight loss: Secondary | ICD-10-CM | POA: Diagnosis not present

## 2016-06-19 DIAGNOSIS — K219 Gastro-esophageal reflux disease without esophagitis: Secondary | ICD-10-CM | POA: Insufficient documentation

## 2016-06-19 DIAGNOSIS — F419 Anxiety disorder, unspecified: Secondary | ICD-10-CM | POA: Diagnosis not present

## 2016-06-19 DIAGNOSIS — C20 Malignant neoplasm of rectum: Secondary | ICD-10-CM

## 2016-06-19 DIAGNOSIS — C250 Malignant neoplasm of head of pancreas: Secondary | ICD-10-CM | POA: Diagnosis not present

## 2016-06-19 DIAGNOSIS — K2970 Gastritis, unspecified, without bleeding: Secondary | ICD-10-CM | POA: Diagnosis not present

## 2016-06-19 HISTORY — PX: EUS: SHX5427

## 2016-06-19 SURGERY — UPPER ENDOSCOPIC ULTRASOUND (EUS) RADIAL
Anesthesia: Monitor Anesthesia Care

## 2016-06-19 MED ORDER — PROPOFOL 500 MG/50ML IV EMUL
INTRAVENOUS | Status: DC | PRN
  Administered 2016-06-19: 20 mg via INTRAVENOUS
  Administered 2016-06-19: 50 mg via INTRAVENOUS

## 2016-06-19 MED ORDER — PROPOFOL 10 MG/ML IV BOLUS
INTRAVENOUS | Status: AC
  Filled 2016-06-19: qty 20

## 2016-06-19 MED ORDER — MIDAZOLAM HCL 2 MG/2ML IJ SOLN
INTRAMUSCULAR | Status: AC
  Filled 2016-06-19: qty 2

## 2016-06-19 MED ORDER — SODIUM CHLORIDE 0.9 % IV SOLN: INTRAVENOUS | Status: DC

## 2016-06-19 MED ORDER — LACTATED RINGERS IV SOLN
INTRAVENOUS | Status: DC
  Administered 2016-06-19 (×2): via INTRAVENOUS

## 2016-06-19 MED ORDER — MIDAZOLAM HCL 5 MG/5ML IJ SOLN
INTRAMUSCULAR | Status: DC | PRN
  Administered 2016-06-19: 2 mg via INTRAVENOUS

## 2016-06-19 MED ORDER — PROPOFOL 500 MG/50ML IV EMUL
INTRAVENOUS | Status: DC | PRN
  Administered 2016-06-19: 125 ug/kg/min via INTRAVENOUS

## 2016-06-19 NOTE — Op Note (Signed)
[facility] Patient Name: Charles [name] Procedure Date: 06/19/2016 MRN: [id number] Attending MD: Milus Banister , MD Date of Birth: 1950-09-25 CSN: [id number] Age: 66 Admit Type: Outpatient Procedure:                Upper EUS Indications:              Suspected mass in pancreas on CT scan; metastatic                            rectal cancer (initial surgery 2012, the isolated                            met to lung s/p 2016 wedge resection); now with                            newly abnormal pancreas and also very abnormal                            right chest. h/o remote alcohol abuse but none in                            25 years. Never previous pancreatic disease. No FH                            of pancreatic disease. Providers:                Milus Banister, MD, Cleda Daub, RN, Alfonso Patten, Technician, Arnoldo Hooker, CRNA Referring MD:              Medicines:                Monitored Anesthesia Care Complications:            No immediate complications. Estimated blood loss:                            None. Estimated Blood Loss:     Estimated blood loss: none. Procedure:                Pre-Anesthesia Assessment:                           - Prior to the procedure, a History and Physical                            was performed, and patient medications and                            allergies were reviewed. The patient's tolerance of                            previous anesthesia was also reviewed. The risks  and benefits of the procedure and the sedation                            options and risks were discussed with the patient.                            All questions were answered, and informed consent                            was obtained. Prior Anticoagulants: The patient has                            taken Pradaxa (dabigatran), last dose was 4 days                            prior to procedure. ASA  Grade Assessment: II - A                            patient with mild systemic disease. After reviewing                            the risks and benefits, the patient was deemed in                            satisfactory condition to undergo the procedure.                           After obtaining informed consent, the endoscope was                            passed under direct vision. Throughout the                            procedure, the patient's blood pressure, pulse, and                            oxygen saturations were monitored continuously. The                            [id number] ([id number]) scope was introduced through                            the mouth, and advanced to the second part of                            duodenum. The upper EUS was accomplished without                            difficulty. The patient tolerated the procedure                            well. Scope In: Scope Out: Findings:      Endoscopic Finding :  The examined esophagus was endoscopically normal.      The entire examined stomach was endoscopically normal.      The examined duodenum was endoscopically normal.      Endosonographic Finding :      1. A vauge irregular mass-like area was identified in the uncinate       process of the pancreas. The mass was hypoechoic. The mass measured 25       mm in maximal cross-sectional diameter. The endosonographic borders were       poorly-defined, but there was no clear involvement of any significant       vascular structures. The remainder of the pancreas was examined. The       endosonographic appearance of parenchyma and the upstream pancreatic       duct indicated duct dilation, an irregularly contoured duct, a maximum       duct diameter of 3.42m (body and tail) and parenchymal atrophy. Fine       needle aspiration for cytology was performed. Color Doppler imaging was       utilized prior to needle puncture to confirm a lack of significant        vascular structures within the needle path. Four passes were made with       the 25 gauge needle using a transduodenal approach. A cytotechnologist       was present to evaluate the adequacy of the specimen. Final cytology       results are pending.      2. No peripancreatic adenopathy.      3. CBD was normal, non-dilated.      4. Limited views of liver, spleen, portal and splenic vessels were all       normal. Impression:               - 2.5cm mass-like lesion in the uncinate process of                            pancreas associated with irregular main pancreatic                            duct. The area does not involve any signficant                            vascular structures, was sampled with FNA.                           - Preliminary cytology review does not show clear                            malignant cells. Moderate Sedation:      N/A- Per Anesthesia Care Recommendation:           - Discharge patient to home (ambulatory).                           - Await final cytology results.                           - Await workup lung, chest findings. Procedure Code(s):        --- Professional ---  43238, Esophagogastroduodenoscopy, flexible,                            transoral; with transendoscopic ultrasound-guided                            intramural or transmural fine needle                            aspiration/biopsy(s), (includes endoscopic                            ultrasound examination limited to the esophagus,                            stomach or duodenum, and adjacent structures) Diagnosis Code(s):        --- Professional ---                           K86.89, Other specified diseases of pancreas                           R93.3, Abnormal findings on diagnostic imaging of                            other parts of digestive tract CPT copyright 2016 [employer]. All rights reserved. The codes documented in this report are  preliminary and upon coder review may  be revised to meet current compliance requirements. Milus Banister, MD 06/19/2016 3:22:15 PM This report has been signed electronically. Number of Addenda: 0

## 2016-06-19 NOTE — Anesthesia Preprocedure Evaluation (Signed)
Anesthesia Evaluation  Patient identified by MRN, date of birth, ID band Patient awake    Reviewed: Allergy & Precautions, NPO status , Patient's Chart, lab work & pertinent test results  Airway Mallampati: II  TM Distance: >3 FB Neck ROM: Full    Dental  (+) Teeth Intact, Poor Dentition   Pulmonary neg pulmonary ROS,    breath sounds clear to auscultation       Cardiovascular hypertension, + Peripheral Vascular Disease   Rhythm:Regular Rate:Normal     Neuro/Psych  Neuromuscular disease    GI/Hepatic hiatal hernia, GERD  ,Pancreas mass   Endo/Other  negative endocrine ROS  Renal/GU negative Renal ROS     Musculoskeletal   Abdominal   Peds  Hematology   Anesthesia Other Findings   Reproductive/Obstetrics                             Anesthesia Physical Anesthesia Plan  ASA: III  Anesthesia Plan: MAC   Post-op Pain Management:    Induction: Intravenous  Airway Management Planned: Natural Airway  Additional Equipment:   Intra-op Plan:   Post-operative Plan:   Informed Consent: I have reviewed the patients History and Physical, chart, labs and discussed the procedure including the risks, benefits and alternatives for the proposed anesthesia with the patient or authorized representative who has indicated his/her understanding and acceptance.     Plan Discussed with: CRNA  Anesthesia Plan Comments:         Anesthesia Quick Evaluation

## 2016-06-19 NOTE — Discharge Instructions (Signed)

## 2016-06-19 NOTE — H&P (View-Only) (Signed)
PROGRESS NOTE   [name], MD [place] [id number]  History of Stage III cancer of the rectum status post preoperative radiation and chemotherapy followed by definitive surgery. He then underwent adjuvant chemotherapy with FOLFOX. During the radiation therapy and continuous infusion 5-FU from 09/09/2010 through 10/10/2010. FOLFOX was administered from October 2012 through 07/14/2011  Stage IV CRC with solitary pulmonary metastases s/p surgical resection with Dr.Gerhardt  CURRENT THERAPY: Surveillance per NCCN guidelines   INTERVAL HISTORY: Charles [name] 66 y.o. male returns for followup of stage IV CRC.   Mr. [name] is accompanied by his wife. I personally reviewed and went over laboratory studies and outside imaging studies with the patient. He had a restaging CT C/A/P performed on 06/13/17 which demonstrated a new right-sided pleural mass/metastasis measures 3.4 x 1.4 cm, a pleural-based 46m posterior right upper lobe pulmonary, new 2.4 cm soft tissue fullness in the pancreatic head and uncinate process, as well as enlargement of the appendiceal tail. Patient's scans were discussed with Dr. JArnoldo Moraleover the phone who did not believe patient had acute appendicitis, especially since patient is asymptomatic.    Complains of soreness on his right side of his thorax. Says when he comes home and sits on the sofa and raises his arms there is a pain that comes on his right side. He notes that it doesn't limit his activity.  Patient newly diagnosed with diabetes in Jan 2018. Also states he's been having light colored stools.  States weight loss. Lost about 15 lbs since late November. Appetite is great but keeps losing weight. Denies appendix pain in lower right abdomen.  Wife says he stays real weak at times, and at times he is very dizzy and staggers around. Notes this has been going on for about 3 weeks.       Rectal cancer ([place]   07/24/2010 Initial Diagnosis      Invasive adenocarcinoma of rectum      09/09/2010 Concurrent Chemotherapy    S/P radiation and concurrent 5-FU continuous infusion from 09/09/10- 10/10/10.      11/14/2010 Surgery    S/P [facility]ct[facility] with colorectal [facility]osis [facility] Pathology reveals a pT3b N1 with 3/20 lymph nodes.      02/05/2011 - 07/14/2011 Chemotherapy    FOLFOX      08/18/2011 Surgery    Approximate date of surgery- [place] by Dr. MHarlon Ditty      Remission         11/24/2013 Survivorship    Colonoscopy- somewhat fibrotic/friable anastomotic mucosal-status post biopsy (narrowing not felt to be clinically significant).  Negative pathology for malignancy      09/12/2014 Imaging    DVT in the left femoral venous system, left common iliac vein, IVC, and within the IVC filter Right lower extremity venography confirms chronic occlusion of the femoral venous system with collateralization. The right iliac venous system is patent and do      09/25/2014 PET scan    The right middle lobe pulmonary nodule is hypermetabolic, favored to represent a primary bronchogenic carcinoma.Equivocal mediastinal nodes, similar to surrounding blood pool. Bilateral adrenal hypermetabolism, felt to be physiologic      10/24/2014 Pathology Results    Lung, needle/core biopsy(ies), RML - ADENOCARCINOMA, SEE COMMENT metastatic adenocarcinoma of a colorectal primary      10/24/2014 Relapse/Recurrence         11/16/2014 Definitive  Surgery    Bronchoscopy, right video-assisted thoracoscopy, wedge resection of right middle lobe by Dr. Servando Snare      11/16/2014 Pathology Results    Lung, wedge biopsy/resection, right lingula and small portion of middle lobe - METASTATIC ADENOCARCINOMA, CONSISTENT WITH COLORECTAL PRIMARY, SPANNING 2.0 CM. - THE SURGICAL RESECTION MARGINS ARE NEGATIVE FOR ADENOCARCINOMA.      11/16/2014 Remission    THE SURGICAL RESECTION MARGINS ARE  NEGATIVE FOR ADENOCARCINOMA.      03/07/2015 Imaging    CT CAP- Interval resection of right middle lobe metastasis. No acute process or evidence of metastatic disease in the chest, abdomen or pelvis. Improved right upper lobe reticular nodular opacities are favored to represent resolving infection.      09/14/2015 Imaging    CT CAP- No findings of recurrent malignancy. No recurrence along the wedge resection site of the right middle lobe. Right anterior abdominal wall focal hernia containing a knuckle of small bowel without complicating feature.      06/13/2016 Imaging    CT CAP- 1. New right-sided pleural metastasis/mass. Other smaller right-sided pulmonary nodules which are all pleural-based and most likely represent pleural metastasis. 2. No convincing evidence of abdominopelvic nodal metastasis. 3. Constellation of findings, including pancreatic atrophy, duct dilatation, and pancreatic head soft tissue fullness which are highly suspicious for pancreatic adenocarcinoma. Metastatic disease felt much less likely. Consider endoscopic ultrasound sampling or ERCP. Cannot exclude superimposed acute pancreatitis. 4. New enlargement of the appendiceal tip with subtle surrounding edema. Cannot exclude early or mild appendicitis. 5. Coronary artery atherosclerosis. Aortic atherosclerosis. 6. 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      06/13/2016 Progression    CT scan demonstrates progression of disease        Past Medical History:  Diagnosis Date  . Chronic anticoagulation   . Colon cancer ([place]) 07/24/10   rectal ca, inv adenocarcinoma  . Depression 04/01/2011  . DVT (deep venous thrombosis) ([place]) 05/09/2011  . GERD (gastroesophageal reflux disease)   . High output ileostomy ([place]) 05/21/2011  . HTN (hypertension)   . Hx of radiation therapy 09/02/10 to 10/14/10   pelvis  . Lung metastasis ([place])   . Peripheral vascular disease ([place])    dvt's,pe  .  Pneumonia    hx x3  . Pulmonary embolism ([place])   . Rectal cancer ([place]) 01/15/2011   S/P radiation and concurrent 5-FU continuous infusion from 09/09/10- 10/10/10.  S/P proctectomy with colorectal anastomosis and diverting loop ileostomy on 11/14/10 at [facility] by Dr. Harlon Ditty. Pathology reveals a pT3b N1 with 3/20 lymph nodes.      has GERD; Rectal cancer ([place]); Hx of radiation therapy; Stiffness of joint, not elsewhere classified, ankle and foot; Weakness of both legs; Peripheral neuropathy due to oxaliplatin-chemotherapy; Diarrhea; Acute pulmonary embolism ([place]); Lactic acidosis; Lower abdominal pain; Urinary retention; Lung nodule, solitary; DVT (deep venous thrombosis) ([place]); Lung nodule; S/P partial lobectomy of lung; GI bleed; Chronic anticoagulation; Constipation; Heme positive stool; Gastritis; S/P thoracotomy; History of rectal cancer; Hiatal hernia; Schatzki's ring; Rectal pain; and Loss of weight on his problem list.     is allergic to oxycodone; tramadol; and trazodone and nefazodone.  Mr. Gongaware had no medications administered during this visit.  Past Surgical History:  Procedure Laterality Date  . COLON SURGERY  11/14/2010   proctectomy with colorectal anastomosis and diverting loop ileostomy (temporary planned)  . COLONOSCOPY  07/2010   proximal rectal apple core mass 10-14cm from anal verge (adenocarcinoma),  2-3cm distal rectal carpet polyp s/p piecemeal snare polypectomy (adenoma)  . COLONOSCOPY  04/21/2012   RMR: Friable,fibrotic appearing colorectal anastomosis producing some luminal narrowing-not felt to be critical. path: focal erosion with slight inflammation and hyperemia. SURVEILLANCE DUE DEC 2015  . COLONOSCOPY N/A 11/24/2013   Dr. Rourk:somewhat fibrotic/friable anastomotic mucosal-status post biopsy (narrowing not felt to be clinically significant) Single colonic diverticulum. benign polypoid rectal mucosa  . colostomy reversal  april 2013  .  ESOPHAGOGASTRODUODENOSCOPY  07/2010   RMR: schatki ring s/p dilation, small hh, SB bx benign  . ESOPHAGOGASTRODUODENOSCOPY N/A 09/04/2015   Procedure: ESOPHAGOGASTRODUODENOSCOPY (EGD);  Surgeon: Daneil Dolin, MD;  Location: [facility];  Service: Endoscopy;  Laterality: N/A;  730  . EUS  08/2010   Dr. Owens Loffler. uT3N0 circumferential, nearly obstruction rectosigmoid adenocarcinoma, distal edge 12cm from anal verge  . HERNIA REPAIR     abd hernia repair  . IVC filter    . ivc filter    . port a cath placement    . PORT-A-CATH REMOVAL  09/24/2011   Procedure: REMOVAL PORT-A-CATH;  Surgeon: Donato Heinz, MD;  Location: [facility] ORS;  Service: General;  Laterality: N/A;  Minor Room  . VIDEO ASSISTED THORACOSCOPY (VATS)/WEDGE RESECTION Right 11/15/2014   Procedure: VIDEO ASSISTED THORACOSCOPY (VATS)/LUNG RESECTION WITH RIGHT LINGULECTOMY;  Surgeon: [name], MD;  Location: [facility];  Service: Thoracic;  Laterality: Right;  Marland Kitchen VIDEO BRONCHOSCOPY N/A 11/15/2014   Procedure: VIDEO BRONCHOSCOPY;  Surgeon: [name], MD;  Location: [facility] OR;  Service: Thoracic;  Laterality: N/A;    Review of Systems  Constitutional: Positive for weight loss (lost 14 pounds since Thanksgiving).  HENT: Negative.   Eyes: Negative.   Respiratory: Negative.   Cardiovascular: Positive for leg swelling (right leg).  Gastrointestinal: Negative.  Negative for abdominal pain.  Genitourinary: Negative.   Musculoskeletal: Negative.        Weakness in the legs Soreness on right side of axilla going down to his abdomen..  Skin: Negative.   Neurological: Positive for dizziness (intermittently) and weakness (intermittently).  Endo/Heme/Allergies: Negative.   Psychiatric/Behavioral: Negative.   All other systems reviewed and are negative. 14 point review of systems was performed and is negative except as detailed under history of present illness and above   PHYSICAL EXAMINATION  ECOG PERFORMANCE STATUS: 0 -  Asymptomatic  Vitals:   06/17/16 0955  BP: 139/80  Pulse: 71  Resp: 18  Temp: 97.6 F (36.4 C)    Filed Weights   06/17/16 0955  Weight: 207 lb 6.4 oz (94.1 kg)     Physical Exam  Constitutional: He is oriented to person, place, and time and well-developed, well-nourished, and in no distress.  HENT:  Head: Normocephalic and atraumatic.  Nose: Nose normal.  Mouth/Throat: Oropharynx is clear and moist. No oropharyngeal exudate.  Eyes: Conjunctivae and EOM are normal. Pupils are equal, round, and reactive to light. Right eye exhibits no discharge. Left eye exhibits no discharge. No scleral icterus.  Neck: Normal range of motion. Neck supple. No tracheal deviation present. No thyromegaly present.  Cardiovascular: Normal rate, regular rhythm and normal heart sounds.  Exam reveals no gallop and no friction rub.   No murmur heard. Pulmonary/Chest: Effort normal and breath sounds normal. He has no wheezes. He has no rales.  Abdominal: Soft. Bowel sounds are normal. He exhibits no distension and no mass. There is no tenderness. There is no rebound and no guarding.  Musculoskeletal: Normal range of motion.  He exhibits no edema.  Lymphadenopathy:    He has no cervical adenopathy.  Neurological: He is alert and oriented to person, place, and time. He has normal reflexes. No cranial nerve deficit. Gait normal. Coordination normal.  Skin: Skin is warm and dry. No rash noted.  Psychiatric: Mood, memory, affect and judgment normal.  Nursing note and vitals reviewed.   LABORATORY DATA: I have reviewed the data as listed. Results for VICTOR, LANGENBACH (MRN [id number]) as of 06/17/2016 09:12  Ref. Range 06/10/2016 08:15  Sodium Latest Ref Range: 135 - 145 mmol/L 137  Potassium Latest Ref Range: 3.5 - 5.1 mmol/L 4.2  Chloride Latest Ref Range: 101 - 111 mmol/L 106  CO2 Latest Ref Range: 22 - 32 mmol/L 26  Glucose Latest Ref Range: 65 - 99 mg/dL 336 (H)  BUN Latest Ref Range: 6 - 20 mg/dL 11   Creatinine Latest Ref Range: 0.61 - 1.24 mg/dL 3.71  Calcium Latest Ref Range: 8.9 - 10.3 mg/dL 9.0  Anion gap Latest Ref Range: 5 - 15  5  Alkaline Phosphatase Latest Ref Range: 38 - 126 U/L 73  Albumin Latest Ref Range: 3.5 - 5.0 g/dL 3.8  AST Latest Ref Range: 15 - 41 U/L 15  ALT Latest Ref Range: 17 - 63 U/L 19  Total Protein Latest Ref Range: 6.5 - 8.1 g/dL 6.4 (L)  Total Bilirubin Latest Ref Range: 0.3 - 1.2 mg/dL 0.8  EGFR (African American) Latest Ref Range: >60 mL/min >60  EGFR (Non-African Amer.) Latest Ref Range: >60 mL/min >60  WBC Latest Ref Range: 4.0 - 10.5 K/uL 5.2  RBC Latest Ref Range: 4.22 - 5.81 MIL/uL 4.77  Hemoglobin Latest Ref Range: 13.0 - 17.0 g/dL 82.7  HCT Latest Ref Range: 39.0 - 52.0 % 41.0  MCV Latest Ref Range: 78.0 - 100.0 fL 86.0  MCH Latest Ref Range: 26.0 - 34.0 pg 29.6  MCHC Latest Ref Range: 30.0 - 36.0 g/dL 84.3  RDW Latest Ref Range: 11.5 - 15.5 % 13.4  Platelets Latest Ref Range: 150 - 400 K/uL 185  Neutrophils Latest Units: % 55  Lymphocytes Latest Units: % 36  Monocytes Relative Latest Units: % 7  Eosinophil Latest Units: % 2  Basophil Latest Units: % 0  NEUT# Latest Ref Range: 1.7 - 7.7 K/uL 2.9  Lymphocyte # Latest Ref Range: 0.7 - 4.0 K/uL 1.9  Monocyte # Latest Ref Range: 0.1 - 1.0 K/uL 0.4  Eosinophils Absolute Latest Ref Range: 0.0 - 0.7 K/uL 0.1  Basophils Absolute Latest Ref Range: 0.0 - 0.1 K/uL 0.0   RADIOLOGY: I have personally reviewed the radiological images as listed and agreed with the findings in the report. Study Result   CT CHEST, ABDOMEN, AND PELVIS WITH CONTRAST 06/13/2016  IMPRESSION: 1. New right-sided pleural metastasis/mass. Other smaller right-sided pulmonary nodules which are all pleural-based and most likely represent pleural metastasis. 2. No convincing evidence of abdominopelvic nodal metastasis. 3. Constellation of findings, including pancreatic atrophy, duct dilatation, and pancreatic head soft tissue  fullness which are highly suspicious for pancreatic adenocarcinoma. Metastatic disease felt much less likely. Consider endoscopic ultrasound sampling or ERCP. Cannot exclude superimposed acute pancreatitis. 4. New enlargement of the appendiceal tip with subtle surrounding edema. Cannot exclude early or mild appendicitis. 5.  Coronary artery atherosclerosis. Aortic atherosclerosis. 6. 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    ASSESSMENT AND PLAN:  Stage IV CRC s/p surgical resection of solitary pulmonary nodule on 11/16/2014 Bronchoscopy, R VATS,  wedge resection RML 11/16/2014 with Dr. Servando Snare History of Stage III CRC Treatment related neuropathy History of IVC filter in 2012 Acute DVT/PE 09/2014, s/p catheter lysis of DVT XARELTO Anxiety on Lexapro GERD  I have reviewed patient's CT C/A/P in detail with the patient and his wife today. Suspect possible new primary with pancreatic CA with metastatic disease, however need tissue diagnosis. Check CA 19-9. He has a Upper Endoscopic ultrasound  scheduled on Thursday Jun 20, 2015 for biopsy of the pancreatic mass. I will send him to IR for biopsy of his right pleural mass. I will order a CT head without contrast to r/o metastatic brain disease given his new symptoms with persistent dizziness. He did not want to do an MRI because of his claustrophobia.   RTC in 2 weeks to review path reports and to discuss the next plan of care.    All questions were answered. The patient knows to call the clinic with any problems, questions or concerns. We can certainly see the patient much sooner if necessary.   This document serves as a record of services personally performed by Twana First, MD. It was created on her behalf by Shirlean Mylar, a trained medical scribe. The creation of this record is based on the scribe's personal observations and the provider's statements to them. This document has been checked and approved by  the attending provider.   I have reviewed the above documentation for accuracy and completeness, and I agree with the above.  This note was electronically signed.

## 2016-06-19 NOTE — Interval H&P Note (Signed)
History and Physical Interval Note:  06/19/2016 1:58 PM  Charles [name]  has presented today for surgery, with the diagnosis of abnormal pancreas  The various methods of treatment have been discussed with the patient and family. After consideration of risks, benefits and other options for treatment, the patient has consented to  Procedure(s): UPPER ENDOSCOPIC ULTRASOUND (EUS) RADIAL (N/A) as a surgical intervention .  The patient's history has been reviewed, patient examined, no change in status, stable for surgery.  I have reviewed the patient's chart and labs.  Questions were answered to the patient's satisfaction.     Milus Banister

## 2016-06-19 NOTE — Transfer of Care (Signed)
Immediate Anesthesia Transfer of Care Note  Patient: Charles [name]  Procedure(s) Performed: Procedure(s): UPPER ENDOSCOPIC ULTRASOUND (EUS) RADIAL (N/A)  Patient Location: PACU and Endoscopy Unit  Anesthesia Type:MAC  Level of Consciousness: awake, oriented, patient cooperative, lethargic and responds to stimulation  Airway & Oxygen Therapy: Patient Spontanous Breathing and Patient connected to nasal cannula oxygen  Post-op Assessment: Report given to RN, Post -op Vital signs reviewed and stable and Patient moving all extremities  Post vital signs: Reviewed and stable  Last Vitals:  Vitals:   06/19/16 1310  BP: (!) 145/83  Pulse: 76  Resp: 15  Temp: 36.4 C    Last Pain:  Vitals:   06/19/16 1310  TempSrc: Oral         Complications: No apparent anesthesia complications

## 2016-06-20 ENCOUNTER — Encounter (HOSPITAL_COMMUNITY): Payer: Self-pay | Admitting: Gastroenterology

## 2016-06-20 ENCOUNTER — Telehealth: Payer: Self-pay | Admitting: Gastroenterology

## 2016-06-20 NOTE — Telephone Encounter (Signed)
The pt states she received a call and was told when the pt could restart the xarelto.

## 2016-06-23 ENCOUNTER — Other Ambulatory Visit (HOSPITAL_COMMUNITY): Payer: Self-pay | Admitting: Oncology

## 2016-06-23 ENCOUNTER — Telehealth (HOSPITAL_COMMUNITY): Payer: Self-pay

## 2016-06-23 DIAGNOSIS — C7800 Secondary malignant neoplasm of unspecified lung: Secondary | ICD-10-CM | POA: Diagnosis not present

## 2016-06-23 DIAGNOSIS — Z299 Encounter for prophylactic measures, unspecified: Secondary | ICD-10-CM | POA: Diagnosis not present

## 2016-06-23 DIAGNOSIS — C20 Malignant neoplasm of rectum: Secondary | ICD-10-CM | POA: Diagnosis not present

## 2016-06-23 DIAGNOSIS — C799 Secondary malignant neoplasm of unspecified site: Secondary | ICD-10-CM | POA: Diagnosis not present

## 2016-06-23 DIAGNOSIS — I2699 Other pulmonary embolism without acute cor pulmonale: Secondary | ICD-10-CM | POA: Diagnosis not present

## 2016-06-23 DIAGNOSIS — Z713 Dietary counseling and surveillance: Secondary | ICD-10-CM | POA: Diagnosis not present

## 2016-06-23 DIAGNOSIS — E1165 Type 2 diabetes mellitus with hyperglycemia: Secondary | ICD-10-CM | POA: Diagnosis not present

## 2016-06-23 DIAGNOSIS — Z6825 Body mass index (BMI) 25.0-25.9, adult: Secondary | ICD-10-CM | POA: Diagnosis not present

## 2016-06-23 DIAGNOSIS — I82409 Acute embolism and thrombosis of unspecified deep veins of unspecified lower extremity: Secondary | ICD-10-CM | POA: Diagnosis not present

## 2016-06-23 MED ORDER — HYDROCODONE-ACETAMINOPHEN 5-325 MG PO TABS: 1.0000 | ORAL_TABLET | Freq: Four times a day (QID) | ORAL | 0 refills | Status: DC | PRN

## 2016-06-23 MED ORDER — OXYCODONE-ACETAMINOPHEN 5-325 MG PO TABS: 1.0000 | ORAL_TABLET | ORAL | 0 refills | Status: DC | PRN

## 2016-06-23 NOTE — Telephone Encounter (Signed)
-----   Message from Baird Cancer, PA-C sent at 06/23/2016  2:20 PM EST ----- Hydrocodone printed TK  ----- Message ----- From: Darlina Guys, LPN Sent: [id number]/86/7544   1:28 PM To: Baird Cancer, PA-C  I don't see any pain med on his medication profile. New orders??  Thanks, MB ----- Message ----- From: Epifanio Lesches Sent: 06/23/2016   1:16 PM To: Darlina Guys, LPN  In a lot of pain would like pain meds

## 2016-06-23 NOTE — Telephone Encounter (Signed)
Notified patients wife prescription was ready for pick up at front desk on 4th floor. She verbalized understanding.

## 2016-06-25 ENCOUNTER — Ambulatory Visit
Admission: RE | Admit: 2016-06-25 | Discharge: 2016-06-25 | Disposition: A | Discharge status: Home / Self Care | Payer: Medicare Other | Source: Ambulatory Visit | Attending: Oncology | Admitting: Oncology

## 2016-06-25 ENCOUNTER — Telehealth (HOSPITAL_COMMUNITY): Payer: Self-pay | Admitting: Oncology

## 2016-06-25 DIAGNOSIS — R918 Other nonspecific abnormal finding of lung field: Secondary | ICD-10-CM | POA: Diagnosis not present

## 2016-06-25 DIAGNOSIS — K869 Disease of pancreas, unspecified: Secondary | ICD-10-CM | POA: Insufficient documentation

## 2016-06-25 DIAGNOSIS — K381 Appendicular concretions: Secondary | ICD-10-CM | POA: Diagnosis not present

## 2016-06-25 DIAGNOSIS — K8689 Other specified diseases of pancreas: Secondary | ICD-10-CM | POA: Diagnosis not present

## 2016-06-25 DIAGNOSIS — C20 Malignant neoplasm of rectum: Secondary | ICD-10-CM | POA: Diagnosis not present

## 2016-06-25 LAB — GLUCOSE, CAPILLARY: Glucose-Capillary: 116 mg/dL — ABNORMAL HIGH (ref 65–99)

## 2016-06-25 MED ORDER — FLUDEOXYGLUCOSE F - 18 (FDG) INJECTION
12.0000 | Freq: Once | INTRAVENOUS | Status: AC | PRN
  Administered 2016-06-25: 12.18 via INTRAVENOUS

## 2016-06-25 NOTE — Telephone Encounter (Signed)
Rhonda from pathology called reporting that there is not enough tissue for foundation one testing.  She will check to see if K-ras testing can be completed.  Robynn Pane, PA-C 06/25/2016 4:54 PM

## 2016-06-25 NOTE — Telephone Encounter (Signed)
I have called pathology and requested FoundationOne testing.  It is reported that there may not be enough tissue for this.  If not, we will try to get KRAS from [facility].  I spoke with Tammy.  Robynn Pane, PA-C 06/25/2016 4:19 PM

## 2016-06-26 ENCOUNTER — Telehealth (HOSPITAL_COMMUNITY): Payer: Self-pay | Admitting: Oncology

## 2016-06-26 NOTE — Telephone Encounter (Signed)
Rhonda from pathology called reporting there is not enough tissue for K-ras testing.  KEFALAS,THOMAS, PA-C 06/26/2016 8:12 AM

## 2016-06-26 NOTE — Anesthesia Postprocedure Evaluation (Addendum)
Anesthesia Post Note  Patient: Charles [name]  Procedure(s) Performed: Procedure(s) (LRB): UPPER ENDOSCOPIC ULTRASOUND (EUS) RADIAL (N/A)  Patient location during evaluation: Endoscopy Anesthesia Type: MAC Level of consciousness: awake and alert Pain management: pain level controlled Vital Signs Assessment: post-procedure vital signs reviewed and stable Respiratory status: spontaneous breathing, nonlabored ventilation, respiratory function stable and patient connected to nasal cannula oxygen Cardiovascular status: stable and blood pressure returned to baseline Anesthetic complications: no       Last Vitals:  Vitals:   06/19/16 1540 06/19/16 1550  BP: 116/70 116/70  Pulse: 65   Resp: 20   Temp:      Last Pain:  Vitals:   06/20/16 1328  TempSrc:   PainSc: 0-No pain                 Joleen Stuckert,JAMES TERRILL

## 2016-06-30 ENCOUNTER — Telehealth (HOSPITAL_COMMUNITY): Payer: Self-pay | Admitting: Emergency Medicine

## 2016-06-30 NOTE — Telephone Encounter (Signed)
Charles [name] wanted to know if we were going to be treating the pancreatic or the lung cancer.  I told her I was unsure but I explained that it was the same cancer that he did not have two different cancers.  The chemo would treat him systemically and would treat all the areas.  She wants to talk to Charles [name] separately from the family after they have finished in the office visit on Wednesday.  Charles [name] does not ever want to be told a life expectancy.  I will let Charles Crigler PA know this information.

## 2016-07-01 NOTE — Patient Instructions (Addendum)
[employer]   CHEMOTHERAPY INSTRUCTIONS  We are going to treat you with the regimen FOLFOX with avastin.  This will be given every 2 weeks.  You will have a pump that you take home home for 48 hours.  You will see the doctor regularly throughout treatment.  We monitor your lab work prior to every treatment.  The doctor monitors your response to treatment by the way you are feeling, your blood work, and scans periodically.  POTENTIAL SIDE EFFECTS OF TREATMENT:  5-Fluorouracil (Adrucil)  About This Drug Fluorouracil is used to treat cancer. It is given in the vein (IV).  Possible Side Effects . Hair loss. Hair loss is often temporary, although with certain medicine, hair loss can sometimes be permanent. Hair loss may happen suddenly or gradually. If you lose hair, you may lose it from your head, face, armpits, pubic area, chest, and/or legs. You may also notice your hair getting thin. . Changes in your nail color, nail loss and/or brittle nail . Darkening of the skin, or changes to the color of your skin and/or veins used for infusion . Rash, itching . Nausea and throwing up (vomiting) . Loose bowel movements (diarrhea) . Ulcers - sores that may cause pain or bleeding in your digestive tract, which includes your mouth, esophagus, stomach, small/large intestines and rectum . Soreness of the mouth and throat. You may have red areas, white patches, or sores that hurt. . Decreased appetite (decreased hunger) . Changes in the tissue of the heart and/or heart attack. Some changes may happen that can cause your heart to have less ability to pump blood. . Bone marrow depression. This is a decrease in the number of white blood cells, red blood cells, and platelets. This may raise your risk of infection, make you tired and weak (fatigue), and raise your risk of bleeding . Sensitivity to light (photosensitivity). Photosensitivity means that you may become more  sensitive to the sun and/or light. Your eyes may water more, mostly in bright light. . Allergic reaction: Allergic reactions, including anaphylaxis are rare but may happen in some patients. Signs of allergic reaction to this drug may be swelling of the face, feeling like your tongue or throat are swelling, trouble breathing, rash, itching, fever, chills, feeling dizzy, and/or feeling that your heart is beating in a fast or not normal way. If this happens, do not take another dose of this drug. You should get urgent medical treatment. . Blurred vision or other changes in eyesight Note: Not all possible side effects are included above.  Warnings and Precautions . Hand-and-foot syndrome. The palms of your hands or soles of your feet may tingle, become numb, painful, swollen, or red. . Changes in your central nervous system can happen. The central nervous system is made up of your brain and spinal cord. You could feel extreme tiredness, agitation, confusion, hallucinations (see or hear things that are not there), trouble understanding or speaking, loss of control of your bowels or bladder, eyesight changes, numbness or lack of strength to your arms, legs, face, or body, and coma. If you start to have any of these symptoms let your doctor know right away. . Side effects of this drug may be unexpectedly severe in some patients Note: Some of the side effects above are very rare. If you have concerns and/or questions, please discuss them with your medical team.  Important Information . This drug may be present in the saliva, tears, sweat, urine, stool,  vomit, semen, and vaginal secretions. Talk to your doctor and/or your nurse about the necessary precautions to take during this time.  Treating Side Effects . To help with hair loss, wash with a mild shampoo and avoid washing your hair every day. . Avoid rubbing your scalp, pat your hair or scalp dry. . Avoid coloring your hair. . Limit your use of hair  spray, electric curlers, blow dryers, and curling irons. . If you are interested in getting a wig, talk to your nurse. You can also call the [facility] at [phone]-[phone] to find out information about the "Look Good, Feel Better" program close to where you live. It is a free program where women getting chemotherapy can learn about wigs, turbans and scarves as well as makeup techniques and skin and nail care. Marland Kitchen Keeping your nails moisturized may help with brittleness. . To help with itching, moisturize your skin several times day. . Avoid sun exposure and apply sunscreen routinely when outdoors. . If you get a rash do not put anything on it unless your doctor or nurse says you may. Keep the area around the rash clean and dry. Ask your doctor for medicine if your rash bothers you. . To help with decreased appetite, eat small, frequent meals. . Eat high caloric food such as pudding, ice cream, yogurt and milkshakes. . Drink plenty of fluids (a minimum of eight glasses per day is recommended). . If you throw up or have loose bowel movements, you should drink more fluids so that you do not become dehydrated (lack water in the body from losing too much fluid). . To help with nausea and vomiting, eat small, frequent meals instead of three large meals a day. Choose foods and drinks that are at room temperature. Ask your nurse or doctor about other helpful tips and medicine that is available to help or stop lessen these symptoms. . If you get diarrhea, eat low-fiber foods that are high in protein and calories and avoid foods that can irritate your digestive tracts or lead to cramping. . Ask your nurse or doctor about medicine that can lessen or stop your diarrhea. . Mouth care is very important. Your mouth care should consist of routine, gentle cleaning of your teeth or dentures and rinsing your mouth with a mixture of 1/2 teaspoon of salt in 8 ounces of water or  teaspoon of baking soda in 8 ounces  of water. This should be done at least after each meal and at bedtime. . If you have mouth sores, avoid mouthwash that has alcohol. Also avoid alcohol and smoking because they can bother your mouth and throat. . Manage tiredness by pacing your activities for the day. . Be sure to include periods of rest between energy-draining activities. . To help decrease your risk of infections, wash your hands regularly. . Avoid close contact with people who have a cold, the flu, or other infections. . Use a soft toothbrush. Check with your nurse before using dental floss. . Be very careful when using knives or tools. . Use an electric shaver instead of a razor.  Food and Drug Interactions . There are no known interactions of fluorouracil with food. . Check with your doctor or pharmacist about all other prescription medicines and dietary supplements you are taking before starting this medicine as there are a lot of known drug interactions with fluorouracil. Also, check with your doctor or pharmacist before starting any new prescription or over-the-counter medicines, or dietary supplement to make  sure that there are no interactions.  When to Call the Doctor Call your doctor or nurse if you have any of these symptoms and/or any new or unusual symptoms: . Fever of 100.5 F (38 C) or higher . Chills . Easy bleeding or bruising . Trouble breathing . Feeling dizzy or lightheaded . Feeling that your heart is beating in a fast or not normal way (palpitations) . Chest pain or symptoms of a heart attack. Most heart attacks involve pain in the center of the chest that lasts more than a few minutes. The pain may go away and come back or it can be constant. It can feel like pressure, squeezing, fullness, or pain. Sometimes pain is felt in one or both arms, the back, neck, jaw, or stomach. If any of these symptoms last 2 minutes, call 911. Marland Kitchen Confusion and/or agitation . Hallucinations . Trouble understanding or  speaking . Blurry vision or changes in your eyesight . Numbness or lack of strength to your arms, legs, face, or body . Nausea that stops you from eating or drinking and/or is not relieved by prescribed medicines . Throwing up more than 3 times a day . Loose bowel movements (diarrhea) 4 times a day or loose bowel movements with lack of strength or a feeling of being dizzy . Lasting loss of appetite or rapid weight loss of five pounds in a week . Pain in your mouth or throat that makes it hard to eat or drink . Pain along the digestive tract - especially if worse after eating . Blood in your vomit (bright red or coffee-ground) and/or stools (bright red, or black/tarry) . Coughing up blood . Fatigue that interferes with your daily activities . Painful, red, or swollen areas on your hands or feet . Numbness and/or tingling of your hands and/or feet . Signs of allergic reaction: swelling of the face, feeling like your tongue or throat are swelling, trouble breathing, rash, itching, fever, chills, feeling dizzy, and/or feeling that your heart is beating in a fast or not normal way . If you think you are pregnant or may have impregnated your partner  Reproduction Warnings . Pregnancy warning: This drug may have harmful effects on the unborn baby. Women of child bearing potential should use effective methods of birth control during your cancer treatment. Let your doctor know right away if you think you may be pregnant. . Breastfeeding warning: It is not known if this drug passes into breast milk. For this reason, women should talk to their doctor about the risks and benefits of breast feeding during treatment with this drug because this drug may enter the breast milk and cause harm to a breast feeding baby. . Fertility warning: In men and women both, this drug may affect your ability to have children in the future. Talk with your doctor or nurse if you plan to have children. Ask for information on sperm  or egg banking.   Bevacizumab (Avastin)  About This Drug Bevacizumab is used to treat cancer. It is given in the vein (IV).  Possible Side Effects . Nosebleed . Headache . Teary eyes . Runny/stuffy nose . Changes in the way food and drinks taste . Bleeding in your rectum . Protein in your urine, which can affect how your kidneys work . Dry skin . A red skin rash which can be peeling or scaling . Back pain . High Blood Pressure Note: Each of the side effects above was reported in 10% or greater of patients  treated with bevacizumab. Not all possible side effects are included above.  Warnings and Precautions . Perforation or fistula- an abnormal hole in your stomach, intestine, esophagus, or other organ, which may be life-threatening . Slow wound healing . Abnormal bleeding which may be life-threatening - symptoms may be coughing up blood, throwing up blood (may look like coffee grounds), red or black tarry bowel movements, abnormally heavy menstrual flow, nosebleeds or any other unusual bleeding. . Blood clots and events such as stroke and heart attack. A blood clot in your leg may cause your leg to swell, appear red and warm, and/or cause pain. A blood clot in your lungs may cause trouble breathing, pain when breathing, and/or chest pain. . Severe high blood pressure . Changes in your central nervous system can happen. The central nervous system is made up of your brain and spinal cord. You could feel extreme tiredness, agitation, confusion, hallucinations (see or hear things that are not there), trouble understanding or speaking, loss of control of your bowels or bladder, eyesight changes, numbness or lack of strength to your arms, legs, face, or body, and coma. If you start to have any of these symptoms let your doctor know right away. . Proteins in your urine, which can rarely cause kidney failure . While you are getting this drug in your vein (IV), you may have a reaction to the  drug. Sometimes you may be given medication to stop or lessen these side effects. Your nurse will check you closely for these signs: fever or shaking chills, flushing, facial swelling, feeling dizzy, headache, trouble breathing, rash, itching, chest tightness, or chest pain. These reactions may happen for 24 hours after your infusion. If this happens, call 911 for emergency care. . In women, changes in your ovaries may happen that may cause menstrual bleeding to become irregular or stop, and may impair fertility Note: Some of the side effects above are very rare. If you have concerns and/or questions, please discuss them with your medical team.  Important Information . Bevacizumab may cause slow wound healing. It should not be given within 28 days of surgery or any test or procedure that needs conscious sedation. If you must have emergency surgery or have an accident that results in a wound, tell the doctor that you are on bevacizumab. Call your cancer doctor as soon as possible for further orders. . This drug may be present in the saliva, tears, sweat, urine, stool, vomit, semen, and vaginal secretions. Talk to your doctor and/or your nurse about the necessary precautions to take during this time.  Treating Side Effects . Keeping your pain under control is important to your well-being. Please tell your doctor or nurse if you are experiencing pain . Taking good care of your mouth may help food taste better and improve your appetite . If you have a nose bleed, sit with your head tipped slightly forward. Apply pressure by lightly pinching the bridge of your nose between your thumb and forefinger. Call your doctor if you feel dizzy or faint or if the bleeding doesn't stop after 10 to 15 minutes. . To help with dry skin, moisturize your skin several times day . Avoid sun exposure and apply sunscreen routinely when outdoors . If you get a rash do not put anything on it unless your doctor or nurse says you  may. Keep the area around the rash clean and dry. Ask your doctor for medicine if your rash bothers you. . Infusion reactions may happen for  24 hours after your infusion. If this happens, call 911 for emergency care.  Food and Drug Interactions . There are no known interactions of bevacizumab with food . This drug may interact with other medicines. Tell your doctor and pharmacist about all the medicines and dietary supplements (vitamins, minerals, herbs and others) that you are taking at this time. The safety and use of dietary supplements and alternative diets are often not known. Using these might affect your cancer or interfere with your treatment. Until more is known, you should not use dietary supplements or alternative diets without your cancer doctor's help.  When to Call the Doctor Call your doctor or nurse if you have any of these symptoms and/or any new or unusual symptoms: . Fever of 100.5 F (38 C) or higher . Chills . Wheezing and/or trouble breathing . Difficulty swallowing . Easy bleeding or bruising . Blood in your urine, vomit (bright red or coffee-ground) and/or stools ( bright red, or black/tarry) . Coughing up blood . Confusion and/or agitation . Hallucinations . Trouble understanding or speaking . Headache that does not go away . Nose bleed that doesn't stop bleeding after 10 -15 minutes . Feeling dizzy or lightheaded . Blurry vision or changes in your eyesight . Numbness or lack of strength to your arms, legs, face, or body . Nausea that stops you from eating or drinking or relieved by prescribed medicine . Throwing up more than 3 times a day . Pain in your abdomen that does not go away . Signs of infusion reaction: fever or shaking chills, flushing, facial swelling, feeling dizzy, headache, trouble breathing, rash, itching, chest tightness, or chest pain. . Pain that does not go away or is not relieved by prescribed medicine . Your leg or arm is swollen, red, warm  and/or painful . Chest pain or symptoms of a heart attack. Most heart attacks involve pain in the center of the chest that lasts more than a few minutes. The pain may go away and come back. It can feel like pressure, squeezing, fullness, or pain. Sometimes pain is felt in one or both arms, the back, neck, jaw, or stomach. If any of these symptoms last 2 minutes, call 911. Marland Kitchen Symptoms of a stroke such as sudden numbness or weakness of your face, arm, or leg, mostly on one side of your body; sudden confusion, trouble speaking or understanding; sudden trouble seeing in one or both eyes; sudden trouble walking, feeling dizzy, loss of balance or coordination; or sudden, bad headache with no known cause. If you have any of these symptoms for 2 minutes, call 911. . If you think you may be pregnant  Reproduction Warnings . Pregnancy warning: This drug can have harmful effects on the unborn baby. Women of child bearing potential should use effective methods of birth control during your cancer treatment and for at least 6 months after treatment. Let your doctor know right away if you think you may be pregnant . Breastfeeding warning: Women should not breast feed during treatment because this drug could enter the breast milk and cause harm to a breast feeding baby. . Fertility warning: This drug may affect your ability to have children in the future. Talk with your doctor or nurse if you plan to have children. Ask for information on egg banking.   Leucovorin Calcium (Generic Name) Other Names: Leucovorin, Wellcovorin, Folinic Acid, Citrovorum Factor  About This Drug Leucovorin is a vitamin. It is used in combination with other cancer fighting drugs such  as 5-fluorouracil and methotrexate. Leucovorin helps 5-fluorouracil kill the cancer cells. It also helps to reduce the side effects of methotrexate. Leucovorin is given in the vein (IV) or by mouth (orally).  Possible Side Effects . Rash .  Wheezing Leucovorin by itself has very few side effects. Side effects you may have can be caused by the other drugs you are taking, such as 5-fluorouracil or methotrexate.  Allergic Reactions . Allergic reactions to this drug are rare. While you are getting this drug by in your vein (IV), please tell your nurse right away if you have any of these symptoms of an allergic reaction: . Trouble catching your breath . Feeling like your tongue or throat are swelling . Feeling your heart beat quickly or in a not normal way (palpitations) . Feeling dizzy or lightheaded . Flushing, itching, rash, and/or hives . If you are taking the oral form of leucovorin and you have these symptoms, do not take another dose of this drug and get urgent medical treatment.  Treating Side Effects If you get a rash do not put anything on it unless your doctor or nurse says you may. Keep the area around the rash clean and dry. Ask your doctor for medicine if your rash bothers you.  Food and Drug Interactions There are no known interactions of leucovorin with food. This drug may interact with other medicines. Tell your doctor and pharmacist about all the medicines and dietary supplements (vitamins, minerals, herbs and others) that you are taking at this time. The safety and use of dietary supplements and alternative diets are often not known. Using these might affect your cancer or interfere with your treatment. Until more is known, you should not use dietary supplements or alternative diets without your cancer doctor's help.  When to Call the Doctor . Call your doctor or nurse right away if you have any of these symptoms: . Wheezing or trouble breathing . Rash with or without itching, redness, or hives . If you take leucovorin by mouth, please also call your doctor right away if you have: Marland Kitchen Throwing up (vomiting) . Loose bowel movements (diarrhea)  Reproduction Concerns . Pregnancy warning: It is not known if this drug  may harm an unborn child. For this reason, be sure to talk with your doctor if you are pregnant or planning to become pregnant while getting this drug. . Genetic counseling is available for you to talk about the effects of this drug therapy on future pregnancies. Also, a genetic counselor can look at the possible risk of problems in the unborn baby due to this medicine if an exposure happens during pregnancy. . Breast feeding warning: It is not known if this drug passes into breast milk. For this reason, women should talk to their doctor about the risks and benefits of breast feeding during treatment with this drug because this drug may enter the breast milk and badly harm a breast feeding baby.   Oxaliplatin (Generic Name) Other Names: EloxatinTM  About This Drug Oxaliplatin is used to treat cancer. It is given in the vein (IV).  Possible Side Effects (More Common) . Effects on the nerves are called peripheral neuropathy. You may feel numbness, tingling, or pain in your hands and feet. Cold temperatures can make it worse. Avoid cold drinks with ice. Dress warmly and cover the skin if you go out in the cold. Wear socks or gloves if you have to touch cold objects like cold flooring or items in the refrigerator or  freezer. . It may be hard for you to button your clothes, open jars, or walk as usual. The effect on the nerves may get worse with more doses of the drug. These effects get better in some people after the drug is stopped but it does not get better in all people . Bone marrow depression: This is a decrease in the number of white blood cells, red blood cells, and platelets. It may increase your risk for infection, make you tired and weak (fatigue), and raise your risk of bleeding. . Nausea and throwing up (vomiting). These symptoms may happen within a few hours after your treatment and may last up to 72 hours. Medicines are available to stop or lessen these side effects. . Electrolyte changes.  Your blood will be checked for electrolyte changes as needed.  Possible Side Effects (Less Common) . Skin and tissue irritation: This may involve redness, pain, warmth, or swelling at the IV site. This happens if the drug leaks out of the vein and into nearby tissue. . Serious allergic reactions including anaphylaxis are rare. While you are getting this drug in your vein (IV), tell your nurse right away if you have any of these symptoms of an allergic reaction: . Trouble catching your breath. . Feeling like your tongue or throat are swelling. . Feeling your heart beat quickly or in a not normal way (palpitations). . Feeling dizzy or lightheaded. . Flushing, itching, rash, or hives. . Changes in your liver function. Your doctor will check your liver function as needed.  Treating Side Effects . Do not drink cold drinks or use ice cubes in drinks. Drink fluids at room temperature or warmer. Drink through a straw. . Wear gloves to touch cold objects. Be aware that most metals are cold to the touch, especially in winter. Examples are your car door handle and your mail box latch. . Wear warm clothing in cold weather at all times. Cover your mouth and nose with a scarf or a pulldown cap (ski cap) to warm the air that goes to your lungs. . Ask your doctor or nurse about medicine to treat nausea, throwing up, headache, or loose bowel movements. . While you are getting this drug in your vein, tell your nurse right away if you have any pain, redness, or swelling at the site of the IV infusion. . Mouth care is very important. Your mouth care should consist of routine, gentle cleaning of your teeth or dentures and rinsing your mouth with a mixture of 1/2 teaspoon of salt in 8 ounces of water or  teaspoon of baking soda in 8 ounces of water. This should be done at least after each meal and at bedtime. . Drink 6-8 cups of fluids each day unless your doctor has told you to limit your fluid intake due to some  other health problem. A cup is 8 ounces of fluid. If you throw up or have loose bowel movements you should drink more fluids so that you do not become dehydrated (lack water in the body due to losing too much fluid).  Food and Drug Interactions There are no known interactions of oxaliplatin with food. This drug may interact with other medicines. Tell your doctor and pharmacist about all the medicines and dietary supplements (vitamins, minerals, herbs and others) that you are taking at this time. The safety and use of dietary supplements and alternative diets are often not known. Using these might affect your cancer or interfere with your treatment. Until more  is known, you should not use dietary supplements or alternative diets without your cancer doctor's help.  When to Call the Doctor Call your doctor or nurse right away if you have any of these symptoms: . Fever of 100.5 F (38 C) or above . Chills . Easy bruising or bleeding . Wheezing or trouble breathing . Rash or itching . Feeling dizzy or lightheaded . Feeling that your heart is beating in a fast or not normal way (palpitations) . Loose bowel movements (diarrhea) more than 4 times a day or diarrhea with weakness or feeling lightheaded . Blurred vision or other changes in eyesight . Pain when passing urine; blood in urine . Pain in your lower back or side . Feeling confused or agitated . Nausea that stops you from eating or drinking . Throwing up more than 3 times a day . Chest pain or symptoms of a heart attack. Most heart attacks involve pain in the center of the chest that lasts more than a few minutes. The pain may go away and come back. It can feel like pressure, squeezing, fullness, or pain. Sometimes pain is felt in one or both arms, the back, neck, jaw, or stomach. If any of these symptoms last 2 minutes, call 911. Marland Kitchen Symptoms of a stroke such as sudden numbness or weakness of your face, arm, or leg, mostly on one side of your  body; sudden confusion, trouble speaking or understanding; sudden trouble seeing in one or both eyes; sudden trouble walking, feeling dizzy, loss of balance or coordination; or sudden, bad headache with no known cause. If you have any of these symptoms for 2 minutes, call 911. . Signs of liver problems: dark urine, pale bowel movements, bad stomach pain, feeling very tired and weak, itching, or yellowing of the eyes or skin. Call your doctor or nurse as soon as possible if any of these symptoms happen: . Change in hearing, ringing in the ears . Decreased urine . Unusual thirst or passing urine often . Pain in your mouth or throat that makes it hard to eat or drink . Nausea that is not relieved by prescribed medicines . Rash that is not relieved by prescribed medicines . Heavy menstrual period that lasts longer than normal . Numbness, tingling, decreased feeling or weakness in fingers, toes, arms, or legs . Trouble walking or changes in the way you walk, feeling clumsy when buttoning clothes, opening jars, or other routine hand motions . Swelling of legs, ankles, or feet . Weight gain of 5 pounds in one week (fluid retention) . Lasting loss of appetite or rapid weight loss of five pounds in a week . Fatigue that interferes with your daily activities . Headache that does not go away . Painful, red, or swollen areas on your hands or feet. . No bowel movement for 3 days or you feel uncomfortable . Extreme weakness that interferes with normal activities  Reproduction Concerns Infertility Warning: Sexual problems and reproduction concerns may happen. In both men and women, this drug may affect your ability to have children. This cannot be determined before your treatment. Talk with your doctor or nurse if you plan to have children. Ask for information on sperm or egg banking. In men, this drug may interfere with your ability to make sperm, but it should not change your ability to have sexual  relations. In women, menstrual bleeding may become irregular or stop while you are getting this drug. Do not assume that you cannot become pregnant if you  do not have a menstrual period. Women may go through signs of menopause (change of life) like vaginal dryness or itching. Vaginal lubricants can be used to lessen vaginal dryness, itching, and pain during sexual relations. Genetic counseling is available for you to talk about the effects of this drug therapy on future pregnancies. Also, a genetic counselor can look at the possible risk of problems in the unborn baby due to this medicine if an exposure happens during pregnancy. Pregnancy warning: This drug may have harmful effects on the unborn child, so effective methods of birth control should be used during your cancer treatment. Breast feeding warning: It is not known if this drug passes into breast milk. For this reason, women should talk to their doctor about the risks and benefits of breast feeding during treatment with this drug because this drug may enter the breast milk and badly harm a breast feeding baby.   SELF CARE ACTIVITIES WHILE ON CHEMOTHERAPY: Hydration Increase your fluid intake 48 hours prior to treatment and drink at least 8 to 12 cups (64 ounces) of water/decaff beverages per day after treatment. You can still have your cup of coffee or soda but these beverages do not count as part of your 8 to 12 cups that you need to drink daily. No alcohol intake.  Medications Continue taking your normal prescription medication as prescribed.  If you start any new herbal or new supplements please let us know first to make sure it is safe.  Mouth Care Have teeth cleaned professionally before starting treatment. Keep dentures and partial plates clean. Use soft toothbrush and do not use mouthwashes that contain alcohol. Biotene is a good mouthwash that is available at most pharmacies or may be ordered by calling [phone]. Use warm salt  water gargles (1 teaspoon salt per 1 quart warm water) before and after meals and at bedtime. Or you may rinse with 2 tablespoons of three-percent hydrogen peroxide mixed in eight ounces of water. If you are still having problems with your mouth or sores in your mouth please call the clinic. If you need dental work, please let the doctor know before you go for your appointment so that we can coordinate the best possible time for you in regards to your chemo regimen. You need to also let your dentist know that you are actively taking chemo. We may need to do labs prior to your dental appointment.   Skin Care Always use sunscreen that has not expired and with SPF (Sun Protection Factor) of 50 or higher. Wear hats to protect your head from the sun. Remember to use sunscreen on your hands, ears, face, & feet.  Use good moisturizing lotions such as udder cream, eucerin, or even Vaseline. Some chemotherapies can cause dry skin, color changes in your skin and nails.    . Avoid long, hot showers or baths. . Use gentle, fragrance-free soaps and laundry detergent. . Use moisturizers, preferably creams or ointments rather than lotions because the thicker consistency is better at preventing skin dehydration. Apply the cream or ointment within 15 minutes of showering. Reapply moisturizer at night, and moisturize your hands every time after you wash them.  Hair Loss (if your doctor says your hair will fall out)  . If your doctor says that your hair is likely to fall out, decide before you begin chemo whether you want to wear a wig. You may want to shop before treatment to match your hair color. . Hats, turbans, and scarves can  also camouflage hair loss, although some people prefer to leave their heads uncovered. If you go bare-headed outdoors, be sure to use sunscreen on your scalp. . Cut your hair short. It eases the inconvenience of shedding lots of hair, but it also can reduce the emotional impact of watching  your hair fall out. . Don't perm or color your hair during chemotherapy. Those chemical treatments are already damaging to hair and can enhance hair loss. Once your chemo treatments are done and your hair has grown back, it's OK to resume dyeing or perming hair. With chemotherapy, hair loss is almost always temporary. But when it grows back, it may be a different color or texture. In older adults who still had hair color before chemotherapy, the new growth may be completely gray.  Often, new hair is very fine and soft.  Infection Prevention Please wash your hands for at least 30 seconds using warm soapy water. Handwashing is the #1 way to prevent the spread of germs. Stay away from sick people or people who are getting over a cold. If you develop respiratory systems such as green/yellow mucus production or productive cough or persistent cough let us know and we will see if you need an antibiotic. It is a good idea to keep a pair of gloves on when going into grocery stores/[employer] to decrease your risk of coming into contact with germs on the carts, etc. Carry alcohol hand gel with you at all times and use it frequently if out in public. If your temperature reaches 100.5 or higher please call the clinic and let us know.  If it is after hours or on the weekend please go to the ER if your temperature is over 100.5.  Please have your own personal thermometer at home to use.    Sex and bodily fluids If you are going to have sex, a condom must be used to protect the person that isn't taking chemotherapy. Chemo can decrease your libido (sex drive). For a few days after chemotherapy, chemotherapy can be excreted through your bodily fluids.  When using the toilet please close the lid and flush the toilet twice.  Do this for a few day after you have had chemotherapy.     Effects of chemotherapy on your sex life Some changes are simple and won't last long. They won't affect your sex life permanently. Sometimes  you may feel: . too tired . not strong enough to be very active . sick or sore  . not in the mood . anxious or low Your anxiety might not seem related to sex. For example, you may be worried about the cancer and how your treatment is going. Or you may be worried about money, or about how you family are coping with your illness. These things can cause stress, which can affect your interest in sex. It's important to talk to your partner about how you feel. Remember - the changes to your sex life don't usually last long. There's usually no medical reason to stop having sex during chemo. The drugs won't have any long term physical effects on your performance or enjoyment of sex. Cancer can't be passed on to your partner during sex  Contraception It's important to use reliable contraception during treatment. Avoid getting pregnant while you or your partner are having chemotherapy. This is because the drugs may harm the baby. Sometimes chemotherapy drugs can leave a man or woman infertile.  This means you would not be able to have children  in the future. You might want to talk to someone about permanent infertility. It can be very difficult to learn that you may no longer be able to have children. Some people find counselling helpful. There might be ways to preserve your fertility, although this is easier for men than for women. You may want to speak to a fertility expert. You can talk about sperm banking or harvesting your eggs. You can also ask about other fertility options, such as donor eggs. If you have or have had breast cancer, your doctor might advise you not to take the contraceptive pill. This is because the hormones in it might affect the cancer.  It is not known for sure whether or not chemotherapy drugs can be passed on through semen or secretions from the vagina. Because of this some doctors advise people to use a barrier method if you have sex during treatment. This applies to vaginal, anal or  oral sex. Generally, doctors advise a barrier method only for the time you are actually having the treatment and for about a week after your treatment. Advice like this can be worrying, but this does not mean that you have to avoid being intimate with your partner. You can still have close contact with your partner and continue to enjoy sex.  Animals If you have cats or birds we just ask that you not change the litter or change the cage.  Please have someone else do this for you while you are on chemotherapy.   Food Safety During and After Cancer Treatment Food safety is important for people both during and after cancer treatment. Cancer and cancer treatments, such as chemotherapy, radiation therapy, and stem cell/bone marrow transplantation, often weaken the immune system. This makes it harder for your body to protect itself from foodborne illness, also called food poisoning. Foodborne illness is caused by eating food that contains harmful bacteria, parasites, or viruses.  Foods to avoid Some foods have a higher risk of becoming tainted with bacteria. These include: Marland Kitchen Unwashed fresh fruit and vegetables, especially leafy vegetables that can hide dirt and other contaminants . Raw sprouts, such as alfalfa sprouts . Raw or undercooked beef, especially ground beef, or other raw or undercooked meat and poultry . Fatty, fried, or spicy foods immediately before or after treatment.  These can sit heavy on your stomach and make you feel nauseous. . Raw or undercooked shellfish, such as oysters. . Sushi and sashimi, which often contain raw fish.  . Unpasteurized beverages, such as unpasteurized fruit juices, raw milk, raw yogurt, or cider . Undercooked eggs, such as soft boiled, over easy, and poached; raw, unpasteurized eggs; or foods made with raw egg, such as homemade raw cookie dough and homemade mayonnaise Simple steps for food safety Shop smart. . Do not buy food stored or displayed in an unclean  area. . Do not buy bruised or damaged fruits or vegetables. . Do not buy cans that have cracks, dents, or bulges. . Pick up foods that can spoil at the end of your shopping trip and store them in a cooler on the way home. Prepare and clean up foods carefully. . Rinse all fresh fruits and vegetables under running water, and dry them with a clean towel or paper towel. . Clean the top of cans before opening them. . After preparing food, wash your hands for 20 seconds with hot water and soap. Pay special attention to areas between fingers and under nails. . Clean your utensils and dishes  with hot water and soap. Marland Kitchen Disinfect your kitchen and cutting boards using 1 teaspoon of liquid, unscented bleach mixed into 1 quart of water.   Dispose of old food. . Eat canned and packaged food before its expiration date (the "use by" or "best before" date). . Consume refrigerated leftovers within 3 to 4 days. After that time, throw out the food. Even if the food does not smell or look spoiled, it still may be unsafe. Some bacteria, such as Listeria, can grow even on foods stored in the refrigerator if they are kept for too long. Take precautions when eating out. . At restaurants, avoid buffets and salad bars where food sits out for a long time and comes in contact with many people. Food can become contaminated when someone with a virus, often a norovirus, or another "bug" handles it. . Put any leftover food in a "to-go" container yourself, rather than having the server do it. And, refrigerate leftovers as soon as you get home. . Choose restaurants that are clean and that are willing to prepare your food as you order it cooked.    MEDICATIONS: Dexamethasone '4mg'$  tablet.  Take 2 tablets (8 mg total) by mouth daily. Start the day after chemotherapy for 2 days. Take with food.  Take with food.                                                                                                                                                                Zofran/Ondansetron '8mg'$  tablet. Take 1 tablet every 8 hours as needed for nausea/vomiting. (#1 nausea med to take, this can constipate)  Compazine/Prochlorperazine '10mg'$  tablet. Take 1 tablet every 6 hours as needed for nausea/vomiting. (#2 nausea med to take, this can make you sleepy)  Lorazepam (ativan) 0.5 mg tablets:  Take 1 tablet (0.5 mg total) by mouth every 6 (six) hours as needed (Nausea or vomiting).  EMLA cream. Apply a quarter size amount to port site 1 hour prior to chemo. Do not rub in. Cover with plastic wrap.   Over-the-Counter Meds:  Miralax 1 capful in 8 oz of fluid daily. May increase to two times a day if needed. This is a stool softener. If this doesn't work proceed you can add:  Senokot S-start with 1 tablet two times a day and increase to 4 tablets two times a day if needed. (total of 8 tablets in a 24 hour period). This is a stimulant laxative.   Call us if this does not help your bowels move.   Imodium '2mg'$  capsule. Take 2 capsules after the 1st loose stool and then 1 capsule every 2 hours until you go a total of 12 hours without having a loose stool. Call the [facility] if loose stools continue. If diarrhea  occurs @ bedtime, take 2 capsules @ bedtime. Then take 2 capsules every 4 hours until morning. Call [facility].     Constipation Sheet *Miralax in 8 oz of fluid daily.  May increase to two times a day if needed.  This is a stool softener.  If this not enough to keep your bowel regular:  You can add:  *Senokot S, start with one tablet twice a day and can increase to 4 tablets twice a day if needed.  This is a stimulant laxative.   Sometimes when you take pain medication you need BOTH a medicine to keep your stool soft and a medicine to help your bowel push it out!  Please call if the above does not work for you.   Do not go more than 2 days without a bowel movement.  It is very important that you do not become  constipated.  It will make you feel sick to your stomach (nausea) and can cause abdominal pain and vomiting.    Diarrhea Sheet  If you are having loose stools/diarrhea, please purchase Imodium and begin taking as outlined:  At the first sign of poorly formed or loose stools you should begin taking Imodium(loperamide) 2 mg capsules.  Take two caplets ('4mg'$ ) followed by one caplet ('2mg'$ ) every 2 hours until you have had no diarrhea for 12 hours.  During the night take two caplets ('4mg'$ ) at bedtime and continue every 4 hours during the night until the morning.  Stop taking Imodium only after there is no sign of diarrhea for 12 hours.    Always call the [facility] if you are having loose stools/diarrhea that you can't get under control.  Loose stools/disrrhea leads to dehydration (loss of water) in your body.  We have other options of trying to get the loose stools/diarrhea to stopped but you must let us know!    Nausea Sheet  Zofran/Ondansetron '8mg'$  tablet. Take 1 tablet every 8 hours as needed for nausea/vomiting. (#1 nausea med to take, this can constipate)  Compazine/Prochlorperazine '10mg'$  tablet. Take 1 tablet every 6 hours as needed for nausea/vomiting. (#2 nausea med to take, this can make you sleepy)  You can take these medications together or separately.  We would first like for you to try the Ondansetron by itself and then take the Prochloperizine if needed. But you are allowed to take both medications at the same time if your nausea is that severe.  If you are having persistent nausea (nausea that does not stop) please take these medications on a staggered schedule so that the nausea medication stays in your body.  Please call the [facility] and let us know the amount of nausea that you are experiencing.  If you begin to vomit, you need to call the [facility] and if it is the weekend and you have vomited more than one time and cant get it to stop-go to the Emergency Room.  Persistent  nausea/vomiting can lead to dehydration (loss of fluid in your body) and will make you feel terrible.   Ice chips, sips of clear liquids, foods that are @ room temperature, crackers, and toast tend to be better tolerated.     SYMPTOMS TO REPORT AS SOON AS POSSIBLE AFTER TREATMENT:  FEVER GREATER THAN 100.5 F  CHILLS WITH OR WITHOUT FEVER  NAUSEA AND VOMITING THAT IS NOT CONTROLLED WITH YOUR NAUSEA MEDICATION  UNUSUAL SHORTNESS OF BREATH  UNUSUAL BRUISING OR BLEEDING  TENDERNESS IN MOUTH AND THROAT WITH OR  WITHOUT PRESENCE OF ULCERS  URINARY PROBLEMS  BOWEL PROBLEMS  UNUSUAL RASH    Wear comfortable clothing and clothing appropriate for easy access to any Portacath or PICC line. Let us know if there is anything that we can do to make your therapy better!     What to do if you need assistance after hours or on the weekends: CALL [phone].  HOLD on the line, do not hang up.  You will hear multiple messages but at the end you will be connected with a nurse triage line.  They will contact the doctor if necessary.  Most of the time they will be able to assist you.  Do not call the hospital operator.       I have been informed and understand all of the instructions given to me and have received a copy. I have been instructed to call the clinic [phone] or my family physician as soon as possible for continued medical care, if indicated. I do not have any more questions at this time but understand that I may call the [facility] or the Patient Navigator at [phone] during office hours should I have questions or need assistance in obtaining follow-up care.

## 2016-07-02 ENCOUNTER — Encounter (HOSPITAL_COMMUNITY): Payer: Self-pay

## 2016-07-02 ENCOUNTER — Encounter (HOSPITAL_BASED_OUTPATIENT_CLINIC_OR_DEPARTMENT_OTHER): Payer: Medicare Other | Admitting: Oncology

## 2016-07-02 ENCOUNTER — Other Ambulatory Visit: Payer: Self-pay | Admitting: Gastroenterology

## 2016-07-02 ENCOUNTER — Telehealth: Payer: Self-pay | Admitting: Gastroenterology

## 2016-07-02 ENCOUNTER — Ambulatory Visit (INDEPENDENT_AMBULATORY_CARE_PROVIDER_SITE_OTHER): Payer: Medicare Other | Admitting: General Surgery

## 2016-07-02 ENCOUNTER — Encounter (HOSPITAL_COMMUNITY)
Admission: RE | Admit: 2016-07-02 | Discharge: 2016-07-02 | Disposition: A | Discharge status: Home / Self Care | Payer: Medicare Other | Source: Ambulatory Visit | Attending: General Surgery | Admitting: General Surgery

## 2016-07-02 ENCOUNTER — Encounter: Payer: Self-pay | Admitting: General Surgery

## 2016-07-02 VITALS — BP 157/92 | HR 84 | Temp 97.9°F | Resp 18 | Wt 205.1 lb

## 2016-07-02 VITALS — BP 139/83 | HR 75 | Temp 98.0°F | Ht 74.0 in | Wt 204.0 lb

## 2016-07-02 DIAGNOSIS — Z01810 Encounter for preprocedural cardiovascular examination: Secondary | ICD-10-CM | POA: Insufficient documentation

## 2016-07-02 DIAGNOSIS — E1142 Type 2 diabetes mellitus with diabetic polyneuropathy: Secondary | ICD-10-CM | POA: Insufficient documentation

## 2016-07-02 DIAGNOSIS — C7800 Secondary malignant neoplasm of unspecified lung: Secondary | ICD-10-CM | POA: Diagnosis not present

## 2016-07-02 DIAGNOSIS — T451X5A Adverse effect of antineoplastic and immunosuppressive drugs, initial encounter: Secondary | ICD-10-CM | POA: Insufficient documentation

## 2016-07-02 DIAGNOSIS — C20 Malignant neoplasm of rectum: Secondary | ICD-10-CM | POA: Diagnosis not present

## 2016-07-02 DIAGNOSIS — Z85048 Personal history of other malignant neoplasm of rectum, rectosigmoid junction, and anus: Secondary | ICD-10-CM | POA: Diagnosis present

## 2016-07-02 DIAGNOSIS — Z923 Personal history of irradiation: Secondary | ICD-10-CM | POA: Insufficient documentation

## 2016-07-02 DIAGNOSIS — I10 Essential (primary) hypertension: Secondary | ICD-10-CM | POA: Insufficient documentation

## 2016-07-02 HISTORY — DX: Polyneuropathy, unspecified: G62.9

## 2016-07-02 HISTORY — DX: Type 2 diabetes mellitus without complications: E11.9

## 2016-07-02 HISTORY — DX: Personal history of urinary calculi: Z87.442

## 2016-07-02 MED ORDER — LIDOCAINE-PRILOCAINE 2.5-2.5 % EX CREA: TOPICAL_CREAM | CUTANEOUS | 3 refills | Status: DC

## 2016-07-02 MED ORDER — LORAZEPAM 0.5 MG PO TABS: 0.5000 mg | ORAL_TABLET | Freq: Four times a day (QID) | ORAL | 0 refills | Status: DC | PRN

## 2016-07-02 MED ORDER — DEXAMETHASONE 4 MG PO TABS: 8.0000 mg | ORAL_TABLET | Freq: Every day | ORAL | 1 refills | Status: DC

## 2016-07-02 MED ORDER — ONDANSETRON HCL 8 MG PO TABS: 8.0000 mg | ORAL_TABLET | Freq: Two times a day (BID) | ORAL | 1 refills | Status: DC | PRN

## 2016-07-02 MED ORDER — PROCHLORPERAZINE MALEATE 10 MG PO TABS: 10.0000 mg | ORAL_TABLET | Freq: Four times a day (QID) | ORAL | 1 refills | Status: DC | PRN

## 2016-07-02 NOTE — Patient Instructions (Signed)
Implanted Port Home Guide An implanted port is a type of central line that is placed under the skin. Central lines are used to provide IV access when treatment or nutrition needs to be given through a person's veins. Implanted ports are used for long-term IV access. An implanted port may be placed because:  You need IV medicine that would be irritating to the small veins in your hands or arms.  You need long-term IV medicines, such as antibiotics.  You need IV nutrition for a long period.  You need frequent blood draws for lab tests.  You need dialysis.  Implanted ports are usually placed in the chest area, but they can also be placed in the upper arm, the abdomen, or the leg. An implanted port has two main parts:  Reservoir. The reservoir is round and will appear as a small, raised area under your skin. The reservoir is the part where a needle is inserted to give medicines or draw blood.  Catheter. The catheter is a thin, flexible tube that extends from the reservoir. The catheter is placed into a large vein. Medicine that is inserted into the reservoir goes into the catheter and then into the vein.  How will I care for my incision site? Do not get the incision site wet. Bathe or shower as directed by your health care provider. How is my port accessed? Special steps must be taken to access the port:  Before the port is accessed, a numbing cream can be placed on the skin. This helps numb the skin over the port site.  Your health care provider uses a sterile technique to access the port. ? Your health care provider must put on a mask and sterile gloves. ? The skin over your port is cleaned carefully with an antiseptic and allowed to dry. ? The port is gently pinched between sterile gloves, and a needle is inserted into the port.  Only "non-coring" port needles should be used to access the port. Once the port is accessed, a blood return should be checked. This helps ensure that the port  is in the vein and is not clogged.  If your port needs to remain accessed for a constant infusion, a clear (transparent) bandage will be placed over the needle site. The bandage and needle will need to be changed every week, or as directed by your health care provider.  Keep the bandage covering the needle clean and dry. Do not get it wet. Follow your health care provider's instructions on how to take a shower or bath while the port is accessed.  If your port does not need to stay accessed, no bandage is needed over the port.  What is flushing? Flushing helps keep the port from getting clogged. Follow your health care provider's instructions on how and when to flush the port. Ports are usually flushed with saline solution or a medicine called heparin. The need for flushing will depend on how the port is used.  If the port is used for intermittent medicines or blood draws, the port will need to be flushed: ? After medicines have been given. ? After blood has been drawn. ? As part of routine maintenance.  If a constant infusion is running, the port may not need to be flushed.  How long will my port stay implanted? The port can stay in for as long as your health care provider thinks it is needed. When it is time for the port to come out, surgery will be   done to remove it. The procedure is similar to the one performed when the port was put in. When should I seek immediate medical care? When you have an implanted port, you should seek immediate medical care if:  You notice a bad smell coming from the incision site.  You have swelling, redness, or drainage at the incision site.  You have more swelling or pain at the port site or the surrounding area.  You have a fever that is not controlled with medicine.  This information is not intended to replace advice given to you by your health care provider. Make sure you discuss any questions you have with your health care provider. Document  Released: 04/21/2005 Document Revised: 09/27/2015 Document Reviewed: 12/27/2012 Elsevier Interactive Patient Education  2017 Elsevier Inc.  

## 2016-07-02 NOTE — Patient Instructions (Signed)
[place] at [facility] Discharge Instructions  RECOMMENDATIONS MADE BY THE CONSULTANT AND ANY TEST RESULTS WILL BE SENT TO YOUR REFERRING PHYSICIAN.  You were seen today by Dr. Twana First You are already scheduled for port a cath placement You will begin chemotherapy on Tuesday next week Follow up in 2 weeks See Amy up front for appointments   Thank you for choosing [place] at [facility] to provide your oncology and hematology care.  To afford each patient quality time with our provider, please arrive at least 15 minutes before your scheduled appointment time.    If you have a lab appointment with the [facility] please come in thru the  Main Entrance and check in at the main information desk  You need to re-schedule your appointment should you arrive 10 or more minutes late.  We strive to give you quality time with our providers, and arriving late affects you and other patients whose appointments are after yours.  Also, if you no show three or more times for appointments you may be dismissed from the clinic at the providers discretion.     Again, thank you for choosing [facility].  Our hope is that these requests will decrease the amount of time that you wait before being seen by our physicians.       _____________________________________________________________  Should you have questions after your visit to [facility], please contact our office at [phone] between the hours of 8:30 a.m. and 4:30 p.m.  Voicemails left after 4:30 p.m. will not be returned until the following business day.  For prescription refill requests, have your pharmacy contact our office.       Resources For Cancer Patients and their Caregivers ? American Cancer Society: Can assist with transportation, wigs, general needs, runs Look Good Feel Better.        336-169-6793 ? Cancer Care: Provides financial assistance, online  support groups, medication/co-pay assistance.  1-800-813-HOPE (504)164-2820) ? [facility] Assists [place] cancer patients and their families through emotional , educational and financial support.  (586)819-1164 ? [place] DSS Where to apply for food stamps, Medicaid and utility assistance. (510)823-1766 ? [facility]: Transportation to medical appointments. 716 758 8371 ? Social Security Administration: May apply for disability if have a Stage IV cancer. 320-022-1512 843-855-5335 ? [employer], Disability and Transit Services: Assists with nutrition, care and transit needs. [place] Support Programs: '@10RELATIVEDAYS'$ @ > Cancer Support Group  2nd Tuesday of the month 1pm-2pm, Journey Room  > Creative Journey  3rd Tuesday of the month 1130am-1pm, Journey Room  > Look Good Feel Better  1st Wednesday of the month 10am-12 noon, Journey Room (Call [facility] to register [phone])

## 2016-07-02 NOTE — Telephone Encounter (Signed)
I received an electronic request for apothecary hemorrhoid cream from [facility] but difficult to rx thru epic. Can you call and make sure they fill for their compounded cream as before.

## 2016-07-02 NOTE — H&P (Signed)
Subjective:     Patient ID: Charles [name], male   DOB: November 23, 1950, 66 y.o.   MRN: [id number]  HPI patient is a 66 year old white male who is referred for Port-A-Cath insertion. He has rectal carcinoma and is about to undergo chemotherapy. He denies any pain. He last had a Port-A-Cath placed 5 years ago but has since been removed.   Review of Systems  Constitutional: Negative.   HENT: Positive for sinus pressure.   Eyes: Positive for pain.  Respiratory: Positive for shortness of breath.   Cardiovascular: Negative.   Gastrointestinal: Negative.   Endocrine: Negative.   Genitourinary: Negative.   Musculoskeletal: Negative.   Skin: Negative.   Allergic/Immunologic: Negative.   Neurological: Positive for dizziness and numbness.  Hematological: Negative.   Psychiatric/Behavioral: Negative.        Past Medical History:  Diagnosis Date  . Chronic anticoagulation   . Colon cancer ([place]) 07/24/10   rectal ca, inv adenocarcinoma  . Depression 04/01/2011  . Diabetes mellitus without complication ([place])   . DVT (deep venous thrombosis) ([place]) 05/09/2011  . GERD (gastroesophageal reflux disease)   . High output ileostomy ([place]) 05/21/2011  . History of kidney stones   . HTN (hypertension)   . Hx of radiation therapy 09/02/10 to 10/14/10   pelvis  . Lung metastasis ([place])   . Neuropathy ([place])   . Peripheral vascular disease ([place])    dvt's,pe  . Pneumonia    hx x3  . Pulmonary embolism ([place])   . Rectal cancer ([place]) 01/15/2011   S/P radiation and concurrent 5-FU continuous infusion from 09/09/10- 10/10/10.  S/P proctectomy with colorectal anastomosis and diverting loop ileostomy on 11/14/10 at [facility] by Dr. Harlon Ditty. Pathology reveals a pT3b N1 with 3/20 lymph nodes.           Past Surgical History:  Procedure Laterality Date  . COLON SURGERY  11/14/2010   proctectomy with colorectal anastomosis and diverting loop ileostomy (temporary planned)  . COLONOSCOPY   07/2010   proximal rectal apple core mass 10-14cm from anal verge (adenocarcinoma), 2-3cm distal rectal carpet polyp s/p piecemeal snare polypectomy (adenoma)  . COLONOSCOPY  04/21/2012   RMR: Friable,fibrotic appearing colorectal anastomosis producing some luminal narrowing-not felt to be critical. path: focal erosion with slight inflammation and hyperemia. SURVEILLANCE DUE DEC 2015  . COLONOSCOPY N/A 11/24/2013   Dr. Rourk:somewhat fibrotic/friable anastomotic mucosal-status post biopsy (narrowing not felt to be clinically significant) Single colonic diverticulum. benign polypoid rectal mucosa  . colostomy reversal  april 2013  . ESOPHAGOGASTRODUODENOSCOPY  07/2010   RMR: schatki ring s/p dilation, small hh, SB bx benign  . ESOPHAGOGASTRODUODENOSCOPY N/A 09/04/2015   Procedure: ESOPHAGOGASTRODUODENOSCOPY (EGD);  Surgeon: Daneil Dolin, MD;  Location: [facility];  Service: Endoscopy;  Laterality: N/A;  730  . EUS  08/2010   Dr. Owens Loffler. uT3N0 circumferential, nearly obstruction rectosigmoid adenocarcinoma, distal edge 12cm from anal verge  . EUS N/A 06/19/2016   Procedure: UPPER ENDOSCOPIC ULTRASOUND (EUS) RADIAL;  Surgeon: Milus Banister, MD;  Location: [facility];  Service: Endoscopy;  Laterality: N/A;  . HERNIA REPAIR     abd hernia repair  . IVC filter    . ivc filter    . port a cath placement    . PORT-A-CATH REMOVAL  09/24/2011   Procedure: REMOVAL PORT-A-CATH;  Surgeon: Donato Heinz, MD;  Location: [facility] ORS;  Service: General;  Laterality: N/A;  Minor Room  . VIDEO ASSISTED THORACOSCOPY (VATS)/WEDGE RESECTION Right 11/15/2014  Procedure: VIDEO ASSISTED THORACOSCOPY (VATS)/LUNG RESECTION WITH RIGHT LINGULECTOMY;  Surgeon: [name], MD;  Location: [facility];  Service: Thoracic;  Laterality: Right;  Marland Kitchen VIDEO BRONCHOSCOPY N/A 11/15/2014   Procedure: VIDEO BRONCHOSCOPY;  Surgeon: [name], MD;  Location: [facility] OR;  Service: Thoracic;  Laterality:  N/A;          Family History  Problem Relation Age of Onset  . Cancer Brother     throat  . Cancer Brother     prostate  . Colon cancer Neg Hx   . Liver disease Neg Hx   . Inflammatory bowel disease Neg Hx           Current Outpatient Prescriptions on File Prior to Visit  Medication Sig Dispense Refill  . gabapentin (NEURONTIN) 300 MG capsule Take 2 capsules (600 mg total) by mouth 3 (three) times daily. (Patient taking differently: Take 900 mg by mouth 3 (three) times daily. ) 180 capsule 6  . glimepiride (AMARYL) 2 MG tablet Take 2 mg by mouth 2 (two) times daily.     Marland Kitchen HYDROcodone-acetaminophen (NORCO/VICODIN) 5-325 MG tablet Take 1 tablet by mouth every 6 (six) hours as needed for moderate pain. 60 tablet 0  . omeprazole (PRILOSEC) 20 MG capsule Take 20 mg by mouth 2 (two) times daily before a meal.     . psyllium (METAMUCIL) 58.6 % powder Take 1 packet by mouth daily.     . rivaroxaban (XARELTO) 20 MG TABS tablet Take 1 tablet (20 mg total) by mouth daily with supper. 30 tablet 3  . tamsulosin (FLOMAX) 0.4 MG CAPS capsule Take 0.4 mg by mouth at bedtime.     . traZODone (DESYREL) 100 MG tablet Take 100 mg by mouth at bedtime.    . vitamin B-12 (CYANOCOBALAMIN) 1000 MCG tablet Take 1,000 mcg by mouth daily.    . vitamin E 400 UNIT capsule Take 400 Units by mouth daily.              Current Facility-Administered Medications on File Prior to Visit  Medication Dose Route Frequency Provider Last Rate Last Dose  . sodium chloride 0.9 % injection 10 mL  10 mL Intravenous PRN Everardo All, MD   10 mL at 08/22/11 1114         Allergies  Allergen Reactions  . Oxycodone     Blisters, hallucinations  . Tramadol     Blisters, hallucinations  . Trazodone And Nefazodone Other (See Comments)    hallucinations       History  Alcohol Use No       History  Smoking Status  . Never Smoker  Smokeless Tobacco  . Never Used        Vitals:   07/02/16 1356  BP: 139/83  Pulse: 75  Temp: 98 F (36.7 C)      Objective:   Physical Exam  Constitutional: He is oriented to person, place, and time. He appears well-developed and well-nourished.  HENT:  Head: Normocephalic and atraumatic.  Neck: Normal range of motion. Neck supple.  Cardiovascular: Normal rate, regular rhythm and normal heart sounds.   Pulmonary/Chest: Effort normal and breath sounds normal.  Abdominal: Soft. Bowel sounds are normal. He exhibits no distension.  Neurological: He is alert and oriented to person, place, and time.  Skin: Skin is warm and dry.  Vitals reviewed.    oncology notes reviewed. Assessment:     *Rectal carcinoma, need for central venous access**    Plan:  scheduled for Port-A-Cath insertion on 07/07/2016. The risks and benefits of the procedure including bleeding, infection, and pneumothorax were fully explained to the patient, who gave informed consent.

## 2016-07-02 NOTE — Progress Notes (Signed)
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 He then underwent adjuvant chemotherapy with FOLFOX. During the radiation therapy and continuous infusion 5-FU from 09/09/2010 through 10/10/2010. FOLFOX was administered from October 2012 through 07/14/2011  Stage IV CRC with solitary pulmonary metastases s/p surgical resection with Dr.Gerhardt  CURRENT THERAPY: Surveillance per NCCN guidelines   INTERVAL HISTORY: Charles [name] 66 y.o. male returns for followup of stage IV CRC.   He is accompanied today by several family members. He has right sided pain that radiates to his back. Denies loss of appetite, or any other concerns. He has occasional high BP readings.     Rectal cancer ([place])   07/24/2010 Initial Diagnosis    Invasive adenocarcinoma of rectum      09/09/2010 Concurrent Chemotherapy    S/P radiation and concurrent 5-FU continuous infusion from 09/09/10- 10/10/10.      11/14/2010 Surgery    S/P proctectomy with colorectal anastomosis and diverting loop ileostomy on 11/14/10 at [facility] by Dr. Harlon Ditty. Pathology reveals a pT3b N1 with 3/20 lymph nodes.      02/05/2011 - 07/14/2011 Chemotherapy    FOLFOX      08/18/2011 Surgery    Approximate date of surgery- [place] by Dr. Harlon Ditty       Remission         11/24/2013 Survivorship    Colonoscopy- somewhat fibrotic/friable anastomotic mucosal-status post biopsy (narrowing not felt to be clinically significant).  Negative pathology for malignancy      09/12/2014 Imaging    DVT in the left femoral venous system, left common iliac vein, IVC, and within the IVC filter Right lower extremity venography confirms chronic occlusion of the femoral venous system with collateralization. The right iliac venous system is patent and do      09/25/2014 PET scan    The right  middle lobe pulmonary nodule is hypermetabolic, favored to represent a primary bronchogenic carcinoma.Equivocal mediastinal nodes, similar to surrounding blood pool. Bilateral adrenal hypermetabolism, felt to be physiologic      10/24/2014 Pathology Results    Lung, needle/core biopsy(ies), RML - ADENOCARCINOMA, SEE COMMENT metastatic adenocarcinoma of a colorectal primary      10/24/2014 Relapse/Recurrence         11/16/2014 Definitive Surgery    Bronchoscopy, right video-assisted thoracoscopy, wedge resection of right middle lobe by Dr. Servando Snare      11/16/2014 Pathology Results    Lung, wedge biopsy/resection, right lingula and small portion of middle lobe - METASTATIC ADENOCARCINOMA, CONSISTENT WITH COLORECTAL PRIMARY, SPANNING 2.0 CM. - THE SURGICAL RESECTION MARGINS ARE NEGATIVE FOR ADENOCARCINOMA.      11/16/2014 Remission    THE SURGICAL RESECTION MARGINS ARE NEGATIVE FOR ADENOCARCINOMA.      03/07/2015 Imaging    CT CAP- Interval resection of right middle lobe metastasis. No acute process or evidence of metastatic disease in the chest, abdomen or pelvis. Improved right upper lobe reticular nodular opacities are favored to represent resolving infection.      09/14/2015 Imaging    CT CAP- No findings of recurrent malignancy. No recurrence along the wedge resection site of the right middle lobe. Right anterior abdominal wall focal hernia containing a knuckle of small bowel without complicating feature.      06/13/2016 Imaging    CT CAP- 1. New right-sided pleural  metastasis/mass. Other smaller right-sided pulmonary nodules which are all pleural-based and most likely represent pleural metastasis. 2. No convincing evidence of abdominopelvic nodal metastasis. 3. Constellation of findings, including pancreatic atrophy, duct dilatation, and pancreatic head soft tissue fullness which are highly suspicious for pancreatic adenocarcinoma. Metastatic disease felt much less likely. Consider  endoscopic ultrasound sampling or ERCP. Cannot exclude superimposed acute pancreatitis. 4. New enlargement of the appendiceal tip with subtle surrounding edema. Cannot exclude early or mild appendicitis. 5. Coronary artery atherosclerosis. Aortic atherosclerosis. 6. 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      06/13/2016 Progression    CT scan demonstrates progression of disease      06/19/2016 Procedure    EUS with FNA by Dr. Ardis Hughs      06/20/2016 Pathology Results    FINE NEEDLE ASPIRATION, ENDOSCOPIC, PANCREAS UNCINATE AREA(SPECIMEN 1 OF 1 COLLECTED 06/19/16): MALIGNANT CELLS CONSISTENT WITH METASTATIC ADENOCARCINOMA.      06/25/2016 PET scan    1. Two pleural-based nodules in the right hemithorax are hypermetabolic. Metastatic disease is a distinct consideration. The scattered pulmonary parenchymal nodule seen on previous diagnostic CT imaging are below the threshold for reliable resolution on PET imaging. 2. Hypermetabolic lesion pancreatic head, consistent with neoplasm. As noted on prior CT, adenocarcinoma is any consideration. No evidence for hypermetabolic abdominal lymphadenopathy. Mottled uptake noted in the liver, but no discrete hepatic metastases are evident on PET imaging. 3. Appendix remains distended up to 09-10 mm diameter with a stone towards the tip in subtle periappendiceal edema/inflammation. The appendix is hypermetabolic along its length. Imaging features are relatively stable in the 12 day interval since prior CT scan. While appendicitis is a consideration, the relative stability over 12 days would be unusual for that etiology. Continued close follow-up recommended.      06/26/2016 Pathology Results    Not enough tissue for foundationONE or K-ras testing.        Past Medical History:  Diagnosis Date  . Chronic anticoagulation   . Colon cancer ([place]) 07/24/10   rectal ca, inv adenocarcinoma  . Depression 04/01/2011  .  Diabetes mellitus without complication ([place])   . DVT (deep venous thrombosis) ([place]) 05/09/2011  . GERD (gastroesophageal reflux disease)   . High output ileostomy ([place]) 05/21/2011  . History of kidney stones   . HTN (hypertension)   . Hx of radiation therapy 09/02/10 to 10/14/10   pelvis  . Lung metastasis ([place])   . Neuropathy ([place])   . Peripheral vascular disease ([place])    dvt's,pe  . Pneumonia    hx x3  . Pulmonary embolism ([place])   . Rectal cancer ([place]) 01/15/2011   S/P radiation and concurrent 5-FU continuous infusion from 09/09/10- 10/10/10.  S/P proctectomy with colorectal anastomosis and diverting loop ileostomy on 11/14/10 at [facility] by Dr. Harlon Ditty. Pathology reveals a pT3b N1 with 3/20 lymph nodes.      has GERD; Rectal cancer ([place]); Hx of radiation therapy; Stiffness of joint, not elsewhere classified, ankle and foot; Weakness of both legs; Peripheral neuropathy due to oxaliplatin-chemotherapy; Diarrhea; Acute pulmonary embolism ([place]); Lactic acidosis; Lower abdominal pain; Urinary retention; Lung nodule, solitary; DVT (deep venous thrombosis) ([place]); Lung nodule; S/P partial lobectomy of lung; GI bleed; Chronic anticoagulation; Constipation; Heme positive stool; Gastritis; S/P thoracotomy; History of rectal cancer; Hiatal hernia; Schatzki's ring; Rectal pain; Loss of weight; and Abnormal pancreas function test on his problem list.     is allergic to oxycodone; tramadol; and trazodone and  nefazodone.  Charles [name] had no medications administered during this visit.  Past Surgical History:  Procedure Laterality Date  . COLON SURGERY  11/14/2010   proctectomy with colorectal anastomosis and diverting loop ileostomy (temporary planned)  . COLONOSCOPY  07/2010   proximal rectal apple core mass 10-14cm from anal verge (adenocarcinoma), 2-3cm distal rectal carpet polyp s/p piecemeal snare polypectomy (adenoma)  . COLONOSCOPY  04/21/2012   RMR: Friable,fibrotic appearing colorectal  anastomosis producing some luminal narrowing-not felt to be critical. path: focal erosion with slight inflammation and hyperemia. SURVEILLANCE DUE DEC 2015  . COLONOSCOPY N/A 11/24/2013   Dr. Rourk:somewhat fibrotic/friable anastomotic mucosal-status post biopsy (narrowing not felt to be clinically significant) Single colonic diverticulum. benign polypoid rectal mucosa  . colostomy reversal  april 2013  . ESOPHAGOGASTRODUODENOSCOPY  07/2010   RMR: schatki ring s/p dilation, small hh, SB bx benign  . ESOPHAGOGASTRODUODENOSCOPY N/A 09/04/2015   Procedure: ESOPHAGOGASTRODUODENOSCOPY (EGD);  Surgeon: Daneil Dolin, MD;  Location: [facility];  Service: Endoscopy;  Laterality: N/A;  730  . EUS  08/2010   Dr. Owens Loffler. uT3N0 circumferential, nearly obstruction rectosigmoid adenocarcinoma, distal edge 12cm from anal verge  . EUS N/A 06/19/2016   Procedure: UPPER ENDOSCOPIC ULTRASOUND (EUS) RADIAL;  Surgeon: Milus Banister, MD;  Location: [facility];  Service: Endoscopy;  Laterality: N/A;  . HERNIA REPAIR     abd hernia repair  . IVC filter    . ivc filter    . port a cath placement    . PORT-A-CATH REMOVAL  09/24/2011   Procedure: REMOVAL PORT-A-CATH;  Surgeon: Donato Heinz, MD;  Location: [facility] ORS;  Service: General;  Laterality: N/A;  Minor Room  . VIDEO ASSISTED THORACOSCOPY (VATS)/WEDGE RESECTION Right 11/15/2014   Procedure: VIDEO ASSISTED THORACOSCOPY (VATS)/LUNG RESECTION WITH RIGHT LINGULECTOMY;  Surgeon: [name], MD;  Location: [facility];  Service: Thoracic;  Laterality: Right;  Marland Kitchen VIDEO BRONCHOSCOPY N/A 11/15/2014   Procedure: VIDEO BRONCHOSCOPY;  Surgeon: [name], MD;  Location: [facility];  Service: Thoracic;  Laterality: N/A;    Review of Systems  Constitutional: Negative.   HENT: Negative.   Eyes: Negative.   Respiratory: Negative.   Cardiovascular: Negative.   Gastrointestinal: Positive for abdominal pain (radiates to back).  Genitourinary: Negative.     Musculoskeletal: Negative.   Skin: Negative.   Neurological: Negative.   Endo/Heme/Allergies: Negative.   Psychiatric/Behavioral: Negative.   All other systems reviewed and are negative. 14 point review of systems was performed and is negative except as detailed under history of present illness and above  PHYSICAL EXAMINATION  ECOG PERFORMANCE STATUS: 0 - Asymptomatic  Vitals:   07/02/16 1540  BP: (!) 157/92  Pulse: 84  Resp: 18  Temp: 97.9 F (36.6 C)    Filed Weights   07/02/16 1540  Weight: 205 lb 1.6 oz (93 kg)   Physical Exam  Constitutional: He is oriented to person, place, and time and well-developed, well-nourished, and in no distress.  HENT:  Head: Normocephalic and atraumatic.  Nose: Nose normal.  Mouth/Throat: Oropharynx is clear and moist. No oropharyngeal exudate.  Eyes: Conjunctivae and EOM are normal. Pupils are equal, round, and reactive to light. Right eye exhibits no discharge. Left eye exhibits no discharge. No scleral icterus.  Neck: Normal range of motion. Neck supple. No tracheal deviation present. No thyromegaly present.  Cardiovascular: Normal rate, regular rhythm and normal heart sounds.  Exam reveals no gallop and no friction rub.   No murmur heard.  Pulmonary/Chest: Effort normal and breath sounds normal. He has no wheezes. He has no rales.  Abdominal: Soft. Bowel sounds are normal. He exhibits no distension and no mass. There is no tenderness. There is no rebound and no guarding.  Appendix inflammation  Musculoskeletal: Normal range of motion. He exhibits no edema.  Lymphadenopathy:    He has no cervical adenopathy.  Neurological: He is alert and oriented to person, place, and time. He has normal reflexes. No cranial nerve deficit. Gait normal. Coordination normal.  Skin: Skin is warm and dry. No rash noted.  Psychiatric: Mood, memory, affect and judgment normal.  Nursing note and vitals reviewed.  LABORATORY DATA: I have reviewed the data as  listed.  CBC Latest Ref Rng & Units 06/10/2016 03/17/2016 12/14/2015  WBC 4.0 - 10.5 K/uL 5.2 6.3 6.1  Hemoglobin 13.0 - 17.0 g/dL 14.1 14.8 14.7  Hematocrit 39.0 - 52.0 % 41.0 43.8 42.9  Platelets 150 - 400 K/uL 185 218 205   CMP Latest Ref Rng & Units 06/10/2016 03/17/2016 12/14/2015  Glucose 65 - 99 mg/dL 184(H) 205(H) 150(H)  BUN 6 - 20 mg/dL '11 14 12  ' Creatinine 0.61 - 1.24 mg/dL 0.71 0.89 0.81  Sodium 135 - 145 mmol/L 137 138 137  Potassium 3.5 - 5.1 mmol/L 4.2 4.5 4.1  Chloride 101 - 111 mmol/L 106 105 107  CO2 22 - 32 mmol/L '26 28 26  ' Calcium 8.9 - 10.3 mg/dL 9.0 8.8(L) 8.3(L)  Total Protein 6.5 - 8.1 g/dL 6.4(L) 6.7 6.4(L)  Total Bilirubin 0.3 - 1.2 mg/dL 0.8 1.1 1.0  Alkaline Phos 38 - 126 U/L 73 80 77  AST 15 - 41 U/L '15 15 18  ' ALT 17 - 63 U/L '19 17 20   ' PATHOLOGY:   RADIOLOGY: I have personally reviewed the radiological images as listed and agreed with the findings in the report.  Initial PET 06/25/2016 IMPRESSION: 1. Two pleural-based nodules in the right hemithorax are hypermetabolic. Metastatic disease is a distinct consideration. The scattered pulmonary parenchymal nodule seen on previous diagnostic CT imaging are below the threshold for reliable resolution on PET imaging. 2. Hypermetabolic lesion pancreatic head, consistent with neoplasm. As noted on prior CT, adenocarcinoma is any consideration. No evidence for hypermetabolic abdominal lymphadenopathy. Mottled uptake noted in the liver, but no discrete hepatic metastases are evident on PET imaging. 3. Appendix remains distended up to 09-10 mm diameter with a stone towards the tip in subtle periappendiceal edema/inflammation. The appendix is hypermetabolic along its length. Imaging features are relatively stable in the 12 day interval since prior CT scan. While appendicitis is a consideration, the relative stability over 12 days would be unusual for that etiology. Continued close follow-up recommended.  CT  Head wo Contrast 06/17/2016 IMPRESSION: Normal exam except for atherosclerotic calcification of the major vessels at the base of the brain.   CT CHEST, ABDOMEN, AND PELVIS WITH CONTRAST 06/13/2016 IMPRESSION: 1. New right-sided pleural metastasis/mass. Other smaller right-sided pulmonary nodules which are all pleural-based and most likely represent pleural metastasis. 2. No convincing evidence of abdominopelvic nodal metastasis. 3. Constellation of findings, including pancreatic atrophy, duct dilatation, and pancreatic head soft tissue fullness which are highly suspicious for pancreatic adenocarcinoma. Metastatic disease felt much less likely. Consider endoscopic ultrasound sampling or ERCP. Cannot exclude superimposed acute pancreatitis. 4. New enlargement of the appendiceal tip with subtle surrounding edema. Cannot exclude early or mild appendicitis. 5.  Coronary artery atherosclerosis. Aortic atherosclerosis. 6. Partial anomalous pulmonary venous return from the left upper  lobe.  ASSESSMENT AND PLAN:  Stage IV CRC s/p surgical resection of solitary pulmonary nodule on 11/16/2014 Bronchoscopy, R VATS, wedge resection RML 11/16/2014 with Dr. Servando Snare History of Stage III CRC Treatment related neuropathy History of IVC filter in 2012 Acute DVT/PE 09/2014, s/p catheter lysis of DVT XARELTO Anxiety on Lexapro GERD  I reviewed the scans and pathology report with the patient and his family in detail today. He has a recurrence of his colorectal cancer. I have reviewed that he does have stage IV cancer so chemotherapy will be given on a palliative basis for disease control to prevent progression and symptom improvement.  Chemoport placed on Monday 07/07/16. Start FOLFOX and Avastin chemotherapy every 2 weeks on Tuesday, March 6. We discussed the side effects of chemotherapy in detail and he will get chemo education again prior to initiation of chemo. Plan to restage with PET-CT after 4 cycles.  If his pleural mets don't respond to treatment, then I may consider sending him to IR for biopsy. We did discuss patient's case with IR regarding biopsy of the pleural mets and they said there is a high risk for pneumothorax given its location.  He will return for follow up on March 20 to assess tolerance to cycle 1 of chemo.  All questions were answered. The patient knows to call the clinic with any problems, questions or concerns. We can certainly see the patient much sooner if necessary.   This document serves as a record of services personally performed by Twana First, MD. It was created on her behalf by Martinique Casey, a trained medical scribe. The creation of this record is based on the scribe's personal observations and the provider's statements to them. This document has been checked and approved by the attending provider.  I have reviewed the above documentation for accuracy and completeness, and I agree with the above.  This note was electronically signed.

## 2016-07-02 NOTE — Progress Notes (Deleted)
Subjective:     Patient ID: Charles [name], male   DOB: June 17, 1950, 66 y.o.   MRN: [id number]  HPI patient is a 66 year old white male with rectal carcinoma who presents for Port-A-Cath insertion. He is about to undergo chemotherapy for recurrence. He had a Port-A-Cath 5 years ago but has since been removed. He has no pain.   Review of Systems  Constitutional: Negative.   HENT: Positive for sinus pressure.   Eyes: Positive for pain.  Respiratory: Positive for shortness of breath.   Cardiovascular: Negative.   Gastrointestinal: Negative.   Genitourinary: Negative.   Musculoskeletal: Negative.   Skin: Negative.   Allergic/Immunologic: Negative.   Neurological: Positive for dizziness and numbness.  Hematological: Negative.   Psychiatric/Behavioral: Negative.        Subjective:     Charles [name]   Objective:    BP 139/83   Pulse 75   Temp 98 F (36.7 C)   Ht '6\' 2"'$  (1.88 m)   Wt 204 lb (92.5 kg)   BMI 26.19 kg/m   General:  {gen appearance:16600}       Assessment:    {DOING WELL:13525::"Doing well postoperatively."}    Plan:    Objective:   Physical Exam  Constitutional: He is oriented to person, place, and time. He appears well-developed and well-nourished.  HENT:  Head: Normocephalic and atraumatic.  Neck: Normal range of motion. Neck supple.  Cardiovascular: Normal rate, regular rhythm and normal heart sounds.   Pulmonary/Chest: Effort normal and breath sounds normal.  Abdominal: Soft. Bowel sounds are normal. He exhibits no distension. There is no tenderness.  Neurological: He is alert and oriented to person, place, and time.  Skin: Skin is warm and dry.  Vitals reviewed.  oncology notes reviewed     Assessment:     Rectal carcinoma, need for central venous access    Plan:     Scheduled for Port-A-Cath insertion on 07/07/2016. The risks and benefits of the procedure including bleeding, infection, and pneumothorax were fully explained to the  patient, who gave informed consent.

## 2016-07-02 NOTE — Patient Instructions (Signed)
Charles [name]  07/02/2016     '@PREFPERIOPPHARMACY'$ @   Your procedure is scheduled on  07/07/2016  Report to [facility] at  720 A.M.  Call this number if you have problems the morning of surgery:  [phone]   Remember:  Do not eat food or drink liquids after midnight.  Take these medicines the morning of surgery with A SIP OF WATER  Prilosec, hydrocodone, neurontin.   Do not wear jewelry, make-up or nail polish.  Do not wear lotions, powders, or perfumes, or deoderant.  Do not shave 48 hours prior to surgery.  Men may shave face and neck.  Do not bring valuables to the hospital.  [facility] is not responsible for any belongings or valuables.  Contacts, dentures or bridgework may not be worn into surgery.  Leave your suitcase in the car.  After surgery it may be brought to your room.  For patients admitted to the hospital, discharge time will be determined by your treatment team.  Patients discharged the day of surgery will not be allowed to drive home.   Name and phone number of your driver:   family Special instructions:  None  Please read over the following fact sheets that you were given. Anesthesia Post-op Instructions and Care and Recovery After Surgery       Implanted Port Insertion Implanted port insertion is a procedure to put in a port and catheter. The port is a device with an injectable disk that can be accessed by your health care provider. The port is connected to a vein in the chest or neck by a small flexible tube (catheter). There are different types of ports. The implanted port may be used as a long-term IV access for:  Medicines, such as chemotherapy.  Fluids.  Liquid nutrition, such as total parenteral nutrition (TPN).  Blood samples. Having a port means that your health care provider will not need to use the veins in your arms for these procedures. Tell a health care provider about:  Any allergies you have.  All medicines  you are taking, especially blood thinners, as well as any vitamins, herbs, eye drops, creams, over-the-counter medicines, and steroids.  Any problems you or family members have had with anesthetic medicines.  Any blood disorders you have.  Any surgeries you have had.  Any medical conditions you have, including diabetes or kidney problems.  Whether you are pregnant or may be pregnant. What are the risks? Generally, this is a safe procedure. However, problems may occur, including:  Allergic reactions to medicines or dyes.  Damage to other structures or organs.  Infection.  Damage to the blood vessel, bruising, or bleeding at the puncture site.  Blood clot.  Breakdown of the skin over the port.  A collection of air in the chest that can cause one of the lungs to collapse (pneumothorax). This is rare. What happens before the procedure? Staying hydrated  Follow instructions from your health care provider about hydration, which may include:  Up to 2 hours before the procedure - you may continue to drink clear liquids, such as water, clear fruit juice, black coffee, and plain tea. Eating and drinking restrictions   Follow instructions from your health care provider about eating and drinking, which may include:  8 hours before the procedure - stop eating heavy meals or foods such as meat, fried foods, or fatty foods.  6 hours before the procedure - stop eating  light meals or foods, such as toast or cereal.  6 hours before the procedure - stop drinking milk or drinks that contain milk.  2 hours before the procedure - stop drinking clear liquids. Medicines   Ask your health care provider about:  Changing or stopping your regular medicines. This is especially important if you are taking diabetes medicines or blood thinners.  Taking medicines such as aspirin and ibuprofen. These medicines can thin your blood. Do not take these medicines before your procedure if your health care  provider instructs you not to.  You may be given antibiotic medicine to help prevent infection. General instructions   Plan to have someone take you home from the hospital or clinic.  If you will be going home right after the procedure, plan to have someone with you for 24 hours.  You may have blood tests.  You may be asked to shower with a germ-killing soap. What happens during the procedure?  To lower your risk of infection:  Your health care team will wash or sanitize their hands.  Your skin will be washed with soap.  Hair may be removed from the surgical area.  An IV tube will be inserted into one of your veins.  You will be given one or more of the following:  A medicine to help you relax (sedative).  A medicine to numb the area (local anesthetic).  Two small cuts (incisions) will be made to insert the port.  One incision will be made in your neck to get access to the vein where the catheter will lie.  The other incision will be made in the upper chest. This is where the port will lie.  The procedure may be done using continuous X-ray (fluoroscopy) or other imaging tools for guidance.  The port and catheter will be placed. There may be a small, raised area where the port is.  The port will be flushed with a salt solution (saline), and blood will be drawn to make sure that it is working correctly.  The incisions will be closed.  Bandages (dressings) may be placed over the incisions. The procedure may vary among health care providers and hospitals. What happens after the procedure?  Your blood pressure, heart rate, breathing rate, and blood oxygen level will be monitored until the medicines you were given have worn off.  Do not drive for 24 hours if you were given a sedative.  You will be given a manufacturer's information card for the type of port that you have. Keep this with you.  Your port will need to be flushed and checked as told by your health care  provider, usually every few weeks.  A chest X-ray will be done to:  Check the placement of the port.  Make sure there is no injury to your lung. Summary  Implanted port insertion is a procedure to put in a port and catheter.  The implanted port is used as a long-term IV access.  The port will need to be flushed and checked as told by your health care provider, usually every few weeks.  Keep your manufacturer's information card with you at all times. This information is not intended to replace advice given to you by your health care provider. Make sure you discuss any questions you have with your health care provider. Document Released: 02/09/2013 Document Revised: 03/12/2016 Document Reviewed: 03/12/2016 Elsevier Interactive Patient Education  2017 [place] Insertion, Care After This sheet gives you information about  how to care for yourself after your procedure. Your health care provider may also give you more specific instructions. If you have problems or questions, contact your health care provider. What can I expect after the procedure? After your procedure, it is common to have:  Discomfort at the port insertion site.  Bruising on the skin over the port. This should improve over 3-4 days. Follow these instructions at home: [facility] care   After your port is placed, you will get a manufacturer's information card. The card has information about your port. Keep this card with you at all times.  Take care of the port as told by your health care provider. Ask your health care provider if you or a family member can get training for taking care of the port at home. A home health care nurse may also take care of the port.  Make sure to remember what type of port you have. Incision care   Follow instructions from your health care provider about how to take care of your port insertion site. Make sure you:  Wash your hands with soap and water before you change your  bandage (dressing). If soap and water are not available, use hand sanitizer.  Change your dressing as told by your health care provider.  Leave stitches (sutures), skin glue, or adhesive strips in place. These skin closures may need to stay in place for 2 weeks or longer. If adhesive strip edges start to loosen and curl up, you may trim the loose edges. Do not remove adhesive strips completely unless your health care provider tells you to do that.  Check your port insertion site every day for signs of infection. Check for:  More redness, swelling, or pain.  More fluid or blood.  Warmth.  Pus or a bad smell. General instructions   Do not take baths, swim, or use a hot tub until your health care provider approves.  Do not lift anything that is heavier than 10 lb (4.5 kg) for a week, or as told by your health care provider.  Ask your health care provider when it is okay to:  Return to work or school.  Resume usual physical activities or sports.  Do not drive for 24 hours if you were given a medicine to help you relax (sedative).  Take over-the-counter and prescription medicines only as told by your health care provider.  Wear a medical alert bracelet in case of an emergency. This will tell any health care providers that you have a port.  Keep all follow-up visits as told by your health care provider. This is important. Contact a health care provider if:  You cannot flush your port with saline as directed, or you cannot draw blood from the port.  You have a fever or chills.  You have more redness, swelling, or pain around your port insertion site.  You have more fluid or blood coming from your port insertion site.  Your port insertion site feels warm to the touch.  You have pus or a bad smell coming from the port insertion site. Get help right away if:  You have chest pain or shortness of breath.  You have bleeding from your port that you cannot  control. Summary  Take care of the port as told by your health care provider.  Change your dressing as told by your health care provider.  Keep all follow-up visits as told by your health care provider. This information is not intended to replace advice  given to you by your health care provider. Make sure you discuss any questions you have with your health care provider. Document Released: 02/09/2013 Document Revised: 03/12/2016 Document Reviewed: 03/12/2016 Elsevier Interactive Patient Education  2017 [place] An implanted port is a type of central line that is placed under the skin. Central lines are used to provide IV access when treatment or nutrition needs to be given through a person's veins. Implanted ports are used for long-term IV access. An implanted port may be placed because:  You need IV medicine that would be irritating to the small veins in your hands or arms.  You need long-term IV medicines, such as antibiotics.  You need IV nutrition for a long period.  You need frequent blood draws for lab tests.  You need dialysis. Implanted ports are usually placed in the chest area, but they can also be placed in the upper arm, the abdomen, or the leg. An implanted port has two main parts:  Reservoir. The reservoir is round and will appear as a small, raised area under your skin. The reservoir is the part where a needle is inserted to give medicines or draw blood.  Catheter. The catheter is a thin, flexible tube that extends from the reservoir. The catheter is placed into a large vein. Medicine that is inserted into the reservoir goes into the catheter and then into the vein. How will I care for my incision site? Do not get the incision site wet. Bathe or shower as directed by your health care provider. How is my port accessed? Special steps must be taken to access the port:  Before the port is accessed, a numbing cream can be placed on the  skin. This helps numb the skin over the port site.  Your health care provider uses a sterile technique to access the port.  Your health care provider must put on a mask and sterile gloves.  The skin over your port is cleaned carefully with an antiseptic and allowed to dry.  The port is gently pinched between sterile gloves, and a needle is inserted into the port.  Only "non-coring" port needles should be used to access the port. Once the port is accessed, a blood return should be checked. This helps ensure that the port is in the vein and is not clogged.  If your port needs to remain accessed for a constant infusion, a clear (transparent) bandage will be placed over the needle site. The bandage and needle will need to be changed every week, or as directed by your health care provider.  Keep the bandage covering the needle clean and dry. Do not get it wet. Follow your health care provider's instructions on how to take a shower or bath while the port is accessed.  If your port does not need to stay accessed, no bandage is needed over the port. What is flushing? Flushing helps keep the port from getting clogged. Follow your health care provider's instructions on how and when to flush the port. Ports are usually flushed with saline solution or a medicine called heparin. The need for flushing will depend on how the port is used.  If the port is used for intermittent medicines or blood draws, the port will need to be flushed:  After medicines have been given.  After blood has been drawn.  As part of routine maintenance.  If a constant infusion is running, the port may not need to be flushed. How long  will my port stay implanted? The port can stay in for as long as your health care provider thinks it is needed. When it is time for the port to come out, surgery will be done to remove it. The procedure is similar to the one performed when the port was put in. When should I seek immediate  medical care? When you have an implanted port, you should seek immediate medical care if:  You notice a bad smell coming from the incision site.  You have swelling, redness, or drainage at the incision site.  You have more swelling or pain at the port site or the surrounding area.  You have a fever that is not controlled with medicine. This information is not intended to replace advice given to you by your health care provider. Make sure you discuss any questions you have with your health care provider. Document Released: 04/21/2005 Document Revised: 09/27/2015 Document Reviewed: 12/27/2012 Elsevier Interactive Patient Education  2017 Elsevier Inc. PATIENT INSTRUCTIONS POST-ANESTHESIA  IMMEDIATELY FOLLOWING SURGERY:  Do not drive or operate machinery for the first twenty four hours after surgery.  Do not make any important decisions for twenty four hours after surgery or while taking narcotic pain medications or sedatives.  If you develop intractable nausea and vomiting or a severe headache please notify your doctor immediately.  FOLLOW-UP:  Please make an appointment with your surgeon as instructed. You do not need to follow up with anesthesia unless specifically instructed to do so.  WOUND CARE INSTRUCTIONS (if applicable):  Keep a dry clean dressing on the anesthesia/puncture wound site if there is drainage.  Once the wound has quit draining you may leave it open to air.  Generally you should leave the bandage intact for twenty four hours unless there is drainage.  If the epidural site drains for more than 36-48 hours please call the anesthesia department.  QUESTIONS?:  Please feel free to call your physician or the hospital operator if you have any questions, and they will be happy to assist you.

## 2016-07-02 NOTE — Progress Notes (Signed)
Subjective:     Patient ID: Charles [name], male   DOB: 1951/02/17, 66 y.o.   MRN: [id number]  HPI patient is a 66 year old white male who is referred for Port-A-Cath insertion. He has rectal carcinoma and is about to undergo chemotherapy. He denies any pain. He last had a Port-A-Cath placed 5 years ago but has since been removed.   Review of Systems  Constitutional: Negative.   HENT: Positive for sinus pressure.   Eyes: Positive for pain.  Respiratory: Positive for shortness of breath.   Cardiovascular: Negative.   Gastrointestinal: Negative.   Endocrine: Negative.   Genitourinary: Negative.   Musculoskeletal: Negative.   Skin: Negative.   Allergic/Immunologic: Negative.   Neurological: Positive for dizziness and numbness.  Hematological: Negative.   Psychiatric/Behavioral: Negative.    Past Medical History:  Diagnosis Date  . Chronic anticoagulation   . Colon cancer ([place]) 07/24/10   rectal ca, inv adenocarcinoma  . Depression 04/01/2011  . Diabetes mellitus without complication ([place])   . DVT (deep venous thrombosis) ([name]) 05/09/2011  . GERD (gastroesophageal reflux disease)   . High output ileostomy ([place]) 05/21/2011  . History of kidney stones   . HTN (hypertension)   . Hx of radiation therapy 09/02/10 to 10/14/10   pelvis  . Lung metastasis ([place])   . Neuropathy ([place])   . Peripheral vascular disease ([place])    dvt's,pe  . Pneumonia    hx x3  . Pulmonary embolism ([place])   . Rectal cancer ([place]) 01/15/2011   S/P radiation and concurrent 5-FU continuous infusion from 09/09/10- 10/10/10.  S/P proctectomy with colorectal anastomosis and diverting loop ileostomy on 11/14/10 at [facility] by Dr. Harlon Ditty. Pathology reveals a pT3b N1 with 3/20 lymph nodes.      Past Surgical History:  Procedure Laterality Date  . COLON SURGERY  11/14/2010   proctectomy with colorectal anastomosis and diverting loop ileostomy (temporary planned)  . COLONOSCOPY  07/2010   proximal rectal apple core  mass 10-14cm from anal verge (adenocarcinoma), 2-3cm distal rectal carpet polyp s/p piecemeal snare polypectomy (adenoma)  . COLONOSCOPY  04/21/2012   RMR: Friable,fibrotic appearing colorectal anastomosis producing some luminal narrowing-not felt to be critical. path: focal erosion with slight inflammation and hyperemia. SURVEILLANCE DUE DEC 2015  . COLONOSCOPY N/A 11/24/2013   Dr. Rourk:somewhat fibrotic/friable anastomotic mucosal-status post biopsy (narrowing not felt to be clinically significant) Single colonic diverticulum. benign polypoid rectal mucosa  . colostomy reversal  april 2013  . ESOPHAGOGASTRODUODENOSCOPY  07/2010   RMR: schatki ring s/p dilation, small hh, SB bx benign  . ESOPHAGOGASTRODUODENOSCOPY N/A 09/04/2015   Procedure: ESOPHAGOGASTRODUODENOSCOPY (EGD);  Surgeon: Daneil Dolin, MD;  Location: [facility] ENDO SUITE;  Service: Endoscopy;  Laterality: N/A;  730  . EUS  08/2010   Dr. Owens Loffler. uT3N0 circumferential, nearly obstruction rectosigmoid adenocarcinoma, distal edge 12cm from anal verge  . EUS N/A 06/19/2016   Procedure: UPPER ENDOSCOPIC ULTRASOUND (EUS) RADIAL;  Surgeon: Milus Banister, MD;  Location: [facility] ENDOSCOPY;  Service: Endoscopy;  Laterality: N/A;  . HERNIA REPAIR     abd hernia repair  . IVC filter    . ivc filter    . port a cath placement    . PORT-A-CATH REMOVAL  09/24/2011   Procedure: REMOVAL PORT-A-CATH;  Surgeon: Donato Heinz, MD;  Location: [facility] ORS;  Service: General;  Laterality: N/A;  Minor Room  . VIDEO ASSISTED THORACOSCOPY (VATS)/WEDGE RESECTION Right 11/15/2014   Procedure: VIDEO ASSISTED THORACOSCOPY (VATS)/LUNG RESECTION WITH  RIGHT LINGULECTOMY;  Surgeon: [name], MD;  Location: [place];  Service: Thoracic;  Laterality: Right;  Marland Kitchen VIDEO BRONCHOSCOPY N/A 11/15/2014   Procedure: VIDEO BRONCHOSCOPY;  Surgeon: [name], MD;  [facility] OR;  Service: Thoracic;  Laterality: N/A;    Family History  Problem Relation Age of Onset  .  Cancer Brother     throat  . Cancer Brother     prostate  . Colon cancer Neg Hx   . Liver disease Neg Hx   . Inflammatory bowel disease Neg Hx     Current Outpatient Prescriptions on File Prior to Visit  Medication Sig Dispense Refill  . gabapentin (NEURONTIN) 300 MG capsule Take 2 capsules (600 mg total) by mouth 3 (three) times daily. (Patient taking differently: Take 900 mg by mouth 3 (three) times daily. ) 180 capsule 6  . glimepiride (AMARYL) 2 MG tablet Take 2 mg by mouth 2 (two) times daily.     Marland Kitchen HYDROcodone-acetaminophen (NORCO/VICODIN) 5-325 MG tablet Take 1 tablet by mouth every 6 (six) hours as needed for moderate pain. 60 tablet 0  . omeprazole (PRILOSEC) 20 MG capsule Take 20 mg by mouth 2 (two) times daily before a meal.     . psyllium (METAMUCIL) 58.6 % powder Take 1 packet by mouth daily.     . rivaroxaban (XARELTO) 20 MG TABS tablet Take 1 tablet (20 mg total) by mouth daily with supper. 30 tablet 3  . tamsulosin (FLOMAX) 0.4 MG CAPS capsule Take 0.4 mg by mouth at bedtime.     . traZODone (DESYREL) 100 MG tablet Take 100 mg by mouth at bedtime.    . vitamin B-12 (CYANOCOBALAMIN) 1000 MCG tablet Take 1,000 mcg by mouth daily.    . vitamin E 400 UNIT capsule Take 400 Units by mouth daily.     Current Facility-Administered Medications on File Prior to Visit  Medication Dose Route Frequency Provider Last Rate Last Dose  . sodium chloride 0.9 % injection 10 mL  10 mL Intravenous PRN Everardo All, MD   10 mL at 08/22/11 1114    Allergies  Allergen Reactions  . Oxycodone     Blisters, hallucinations  . Tramadol     Blisters, hallucinations  . Trazodone And Nefazodone Other (See Comments)    hallucinations    History  Alcohol Use No    History  Smoking Status  . Never Smoker  Smokeless Tobacco  . Never Used    Vitals:   07/02/16 1356  BP: 139/83  Pulse: 75  Temp: 98 F (36.7 C)      Objective:   Physical Exam  Constitutional: He is oriented to  person, place, and time. He appears well-developed and well-nourished.  HENT:  Head: Normocephalic and atraumatic.  Neck: Normal range of motion. Neck supple.  Cardiovascular: Normal rate, regular rhythm and normal heart sounds.   Pulmonary/Chest: Effort normal and breath sounds normal.  Abdominal: Soft. Bowel sounds are normal. He exhibits no distension.  Neurological: He is alert and oriented to person, place, and time.  Skin: Skin is warm and dry.  Vitals reviewed.    oncology notes reviewed. Assessment:     *Rectal carcinoma, need for central venous access**    Plan:    scheduled for Port-A-Cath insertion on 07/07/2016. The risks and benefits of the procedure including bleeding, infection, and pneumothorax were fully explained to the patient, who gave informed consent.

## 2016-07-02 NOTE — Telephone Encounter (Signed)
Spoke with Event organiser at [facility] and they did not have the refill. I gave him the refill per [facility] instructions on the refill.

## 2016-07-02 NOTE — Telephone Encounter (Signed)
See phone note

## 2016-07-03 ENCOUNTER — Encounter (HOSPITAL_COMMUNITY): Payer: Self-pay | Admitting: Emergency Medicine

## 2016-07-03 NOTE — Progress Notes (Signed)
Chemotherapy teaching pulled together.  Pts wife called and explained the medications that I had called into [facility].  Told her to call me if she had any questions when she picked them up.  She verbalized understanding.

## 2016-07-07 ENCOUNTER — Ambulatory Visit (HOSPITAL_COMMUNITY): Payer: Medicare Other | Admitting: Anesthesiology

## 2016-07-07 ENCOUNTER — Ambulatory Visit (HOSPITAL_COMMUNITY)
Admission: RE | Admit: 2016-07-07 | Discharge: 2016-07-07 | Disposition: A | Discharge status: Home / Self Care | Payer: Medicare Other | Source: Ambulatory Visit | Attending: General Surgery | Admitting: General Surgery

## 2016-07-07 ENCOUNTER — Encounter (HOSPITAL_COMMUNITY): Payer: Self-pay | Admitting: *Deleted

## 2016-07-07 ENCOUNTER — Encounter (HOSPITAL_COMMUNITY)
Admission: RE | Disposition: A | Discharge status: Home / Self Care | Payer: Self-pay | Source: Ambulatory Visit | Attending: General Surgery

## 2016-07-07 ENCOUNTER — Ambulatory Visit (HOSPITAL_COMMUNITY): Payer: Medicare Other

## 2016-07-07 DIAGNOSIS — Z980 Intestinal bypass and anastomosis status: Secondary | ICD-10-CM | POA: Insufficient documentation

## 2016-07-07 DIAGNOSIS — E1140 Type 2 diabetes mellitus with diabetic neuropathy, unspecified: Secondary | ICD-10-CM | POA: Insufficient documentation

## 2016-07-07 DIAGNOSIS — Z923 Personal history of irradiation: Secondary | ICD-10-CM | POA: Insufficient documentation

## 2016-07-07 DIAGNOSIS — Z85118 Personal history of other malignant neoplasm of bronchus and lung: Secondary | ICD-10-CM | POA: Diagnosis not present

## 2016-07-07 DIAGNOSIS — C20 Malignant neoplasm of rectum: Secondary | ICD-10-CM | POA: Insufficient documentation

## 2016-07-07 DIAGNOSIS — Z86718 Personal history of other venous thrombosis and embolism: Secondary | ICD-10-CM | POA: Diagnosis not present

## 2016-07-07 DIAGNOSIS — Z791 Long term (current) use of non-steroidal anti-inflammatories (NSAID): Secondary | ICD-10-CM | POA: Insufficient documentation

## 2016-07-07 DIAGNOSIS — K219 Gastro-esophageal reflux disease without esophagitis: Secondary | ICD-10-CM | POA: Diagnosis not present

## 2016-07-07 DIAGNOSIS — Z452 Encounter for adjustment and management of vascular access device: Secondary | ICD-10-CM | POA: Diagnosis not present

## 2016-07-07 DIAGNOSIS — E1151 Type 2 diabetes mellitus with diabetic peripheral angiopathy without gangrene: Secondary | ICD-10-CM | POA: Insufficient documentation

## 2016-07-07 DIAGNOSIS — Z79899 Other long term (current) drug therapy: Secondary | ICD-10-CM | POA: Diagnosis not present

## 2016-07-07 DIAGNOSIS — I10 Essential (primary) hypertension: Secondary | ICD-10-CM | POA: Diagnosis not present

## 2016-07-07 DIAGNOSIS — Z86711 Personal history of pulmonary embolism: Secondary | ICD-10-CM | POA: Diagnosis not present

## 2016-07-07 DIAGNOSIS — Z7901 Long term (current) use of anticoagulants: Secondary | ICD-10-CM | POA: Diagnosis not present

## 2016-07-07 DIAGNOSIS — Z95828 Presence of other vascular implants and grafts: Secondary | ICD-10-CM

## 2016-07-07 DIAGNOSIS — Z7984 Long term (current) use of oral hypoglycemic drugs: Secondary | ICD-10-CM | POA: Diagnosis not present

## 2016-07-07 DIAGNOSIS — C19 Malignant neoplasm of rectosigmoid junction: Secondary | ICD-10-CM | POA: Diagnosis not present

## 2016-07-07 HISTORY — PX: PORTACATH PLACEMENT: SHX2246

## 2016-07-07 LAB — GLUCOSE, CAPILLARY: Glucose-Capillary: 155 mg/dL — ABNORMAL HIGH (ref 65–99)

## 2016-07-07 SURGERY — INSERTION, TUNNELED CENTRAL VENOUS DEVICE, WITH PORT
Anesthesia: Monitor Anesthesia Care | Site: Chest | Laterality: Right

## 2016-07-07 MED ORDER — MIDAZOLAM HCL 5 MG/5ML IJ SOLN
INTRAMUSCULAR | Status: DC | PRN
  Administered 2016-07-07 (×2): 1 mg via INTRAVENOUS

## 2016-07-07 MED ORDER — CEFAZOLIN SODIUM-DEXTROSE 2-4 GM/100ML-% IV SOLN
2.0000 g | INTRAVENOUS | Status: AC
  Administered 2016-07-07: 2 g via INTRAVENOUS
  Filled 2016-07-07: qty 100

## 2016-07-07 MED ORDER — PROPOFOL 10 MG/ML IV BOLUS
INTRAVENOUS | Status: DC | PRN
  Administered 2016-07-07: 20 mg via INTRAVENOUS
  Administered 2016-07-07: 10 mg via INTRAVENOUS

## 2016-07-07 MED ORDER — KETOROLAC TROMETHAMINE 30 MG/ML IJ SOLN
30.0000 mg | Freq: Once | INTRAMUSCULAR | Status: AC
  Administered 2016-07-07: 30 mg via INTRAVENOUS
  Filled 2016-07-07: qty 1

## 2016-07-07 MED ORDER — LIDOCAINE HCL (PF) 1 % IJ SOLN
INTRAMUSCULAR | Status: AC
  Filled 2016-07-07: qty 30

## 2016-07-07 MED ORDER — HEPARIN SOD (PORK) LOCK FLUSH 100 UNIT/ML IV SOLN
INTRAVENOUS | Status: DC | PRN
  Administered 2016-07-07: 500 [IU] via INTRAVENOUS

## 2016-07-07 MED ORDER — LIDOCAINE HCL (PF) 1 % IJ SOLN
INTRAMUSCULAR | Status: DC | PRN
  Administered 2016-07-07: 10 mL

## 2016-07-07 MED ORDER — MIDAZOLAM HCL 2 MG/2ML IJ SOLN
INTRAMUSCULAR | Status: AC
  Filled 2016-07-07: qty 2

## 2016-07-07 MED ORDER — PROPOFOL 10 MG/ML IV BOLUS
INTRAVENOUS | Status: AC
  Filled 2016-07-07: qty 20

## 2016-07-07 MED ORDER — LACTATED RINGERS IV SOLN
INTRAVENOUS | Status: DC
  Administered 2016-07-07: 08:00:00 via INTRAVENOUS

## 2016-07-07 MED ORDER — PROPOFOL 500 MG/50ML IV EMUL
INTRAVENOUS | Status: DC | PRN
  Administered 2016-07-07: 75 ug/kg/min via INTRAVENOUS

## 2016-07-07 MED ORDER — HEPARIN SOD (PORK) LOCK FLUSH 100 UNIT/ML IV SOLN
INTRAVENOUS | Status: AC
  Filled 2016-07-07: qty 5

## 2016-07-07 MED ORDER — FENTANYL CITRATE (PF) 100 MCG/2ML IJ SOLN
INTRAMUSCULAR | Status: AC
  Filled 2016-07-07: qty 2

## 2016-07-07 MED ORDER — CHLORHEXIDINE GLUCONATE CLOTH 2 % EX PADS: 6.0000 | MEDICATED_PAD | Freq: Once | CUTANEOUS | Status: DC

## 2016-07-07 MED ORDER — SODIUM CHLORIDE 0.9 % IV SOLN
INTRAVENOUS | Status: DC | PRN
  Administered 2016-07-07: 500 mL via INTRAMUSCULAR

## 2016-07-07 MED ORDER — FENTANYL CITRATE (PF) 100 MCG/2ML IJ SOLN
25.0000 ug | INTRAMUSCULAR | Status: AC
  Administered 2016-07-07: 25 ug via INTRAVENOUS

## 2016-07-07 MED ORDER — MIDAZOLAM HCL 2 MG/2ML IJ SOLN
1.0000 mg | INTRAMUSCULAR | Status: AC
  Administered 2016-07-07: 2 mg via INTRAVENOUS

## 2016-07-07 SURGICAL SUPPLY — 35 items
APPLIER CLIP 9.375 SM OPEN (CLIP)
BAG DECANTER FOR FLEXI CONT (MISCELLANEOUS) ×2 IMPLANT
BAG HAMPER (MISCELLANEOUS) ×2 IMPLANT
CATH HICKMAN DUAL 12.0 (CATHETERS) IMPLANT
CHLORAPREP W/TINT 10.5 ML (MISCELLANEOUS) ×2 IMPLANT
CLIP APPLIE 9.375 SM OPEN (CLIP) IMPLANT
CLOTH BEACON ORANGE TIMEOUT ST (SAFETY) ×2 IMPLANT
COVER LIGHT HANDLE STERIS (MISCELLANEOUS) ×4 IMPLANT
DECANTER SPIKE VIAL GLASS SM (MISCELLANEOUS) ×2 IMPLANT
DERMABOND ADVANCED (GAUZE/BANDAGES/DRESSINGS) ×1
DERMABOND ADVANCED .7 DNX12 (GAUZE/BANDAGES/DRESSINGS) ×1 IMPLANT
DRAPE C-ARM FOLDED MOBILE STRL (DRAPES) ×2 IMPLANT
ELECT REM PT RETURN 9FT ADLT (ELECTROSURGICAL) ×2
ELECTRODE REM PT RTRN 9FT ADLT (ELECTROSURGICAL) ×1 IMPLANT
GLOVE BIOGEL PI IND STRL 7.0 (GLOVE) ×1 IMPLANT
GLOVE BIOGEL PI INDICATOR 7.0 (GLOVE) ×1
GLOVE SURG SS PI 7.5 STRL IVOR (GLOVE) ×2 IMPLANT
GOWN STRL REUS W/TWL LRG LVL3 (GOWN DISPOSABLE) ×4 IMPLANT
IV NS 500ML (IV SOLUTION) ×1
IV NS 500ML BAXH (IV SOLUTION) ×1 IMPLANT
KIT PORT POWER 8FR ISP MRI (Port) ×2 IMPLANT
KIT ROOM TURNOVER APOR (KITS) ×2 IMPLANT
MANIFOLD NEPTUNE II (INSTRUMENTS) ×2 IMPLANT
NEEDLE HYPO 25X1 1.5 SAFETY (NEEDLE) ×2 IMPLANT
PACK MINOR (CUSTOM PROCEDURE TRAY) ×2 IMPLANT
PAD ARMBOARD 7.5X6 YLW CONV (MISCELLANEOUS) ×2 IMPLANT
SET BASIN LINEN APH (SET/KITS/TRAYS/PACK) ×2 IMPLANT
SET INTRODUCER 12FR PACEMAKER (SHEATH) IMPLANT
SHEATH COOK PEEL AWAY SET 8F (SHEATH) IMPLANT
SUT PROLENE 3 0 PS 2 (SUTURE) IMPLANT
SUT VIC AB 3-0 SH 27 (SUTURE) ×1
SUT VIC AB 3-0 SH 27X BRD (SUTURE) ×1 IMPLANT
SUT VIC AB 4-0 PS2 27 (SUTURE) ×2 IMPLANT
SYR 20CC LL (SYRINGE) ×2 IMPLANT
SYR CONTROL 10ML LL (SYRINGE) ×2 IMPLANT

## 2016-07-07 NOTE — Anesthesia Preprocedure Evaluation (Signed)
Anesthesia Evaluation  Patient identified by MRN, date of birth, ID band Patient awake    Reviewed: Allergy & Precautions, NPO status , Patient's Chart, lab work & pertinent test results, reviewed documented beta blocker date and time   History of Anesthesia Complications Negative for: history of anesthetic complications  Airway Mallampati: II  TM Distance: >3 FB Neck ROM: Full    Dental  (+) Poor Dentition, Missing, Chipped, Dental Advisory Given   Pulmonary shortness of breath and with exertion, PE Hx lung mets from rectal cancer    breath sounds clear to auscultation       Cardiovascular Exercise Tolerance: Good hypertension, + Peripheral Vascular Disease and + DVT (Thrombolysis in 5/16)   Rhythm:Regular Rate:Normal     Neuro/Psych PSYCHIATRIC DISORDERS (depression, anxiety) Anxiety Depression  Neuromuscular disease (peripheral neuropathy from chemo)    GI/Hepatic Neg liver ROS, hiatal hernia, GERD  Medicated and Controlled,  Endo/Other  negative endocrine ROSdiabetes, Type 2, Oral Hypoglycemic Agents  Renal/GU negative Renal ROS     Musculoskeletal   Abdominal   Peds  Hematology   Anesthesia Other Findings   Reproductive/Obstetrics                             Anesthesia Physical Anesthesia Plan  ASA: IV  Anesthesia Plan: MAC   Post-op Pain Management:    Induction: Intravenous  Airway Management Planned: Simple Face Mask  Additional Equipment:   Intra-op Plan:   Post-operative Plan:   Informed Consent: I have reviewed the patients History and Physical, chart, labs and discussed the procedure including the risks, benefits and alternatives for the proposed anesthesia with the patient or authorized representative who has indicated his/her understanding and acceptance.     Plan Discussed with:   Anesthesia Plan Comments:         Anesthesia Quick Evaluation

## 2016-07-07 NOTE — Op Note (Signed)
Patient:  Charles [name]  DOB:  14-Oct-1950  MRN:  [id number]   Preop Diagnosis:  Rectal carcinoma, need for central venous access  Postop Diagnosis:  Same  Procedure:  Port-A-Cath insertion  Surgeon:  Aviva Signs, M.D.  Anes:  Mac  Indications:  Patient is a 66 year old white male who is about to undergo chemotherapy therapy for recurrent rectal carcinoma. The risks and benefits of the procedure including bleeding, infection, and pneumothorax were fully explained to the patient, who gave informed consent.  Procedure note:  The patient was placed in the Trendelenburg position after the right upper chest was prepped and draped using usual sterile technique with DuraPrep. Surgical site confirmation was performed. One percent Xylocaine was used for local anesthesia.  A transverse incision was made below the right clavicle. A subcutaneous pocket was formed. The needles advanced into the right subclavian vein using the Seldinger technique without difficulty. A guidewire was then advanced into the right atrium under fluoroscopic guidance. An introducer and peel-away sheath were placed over the guidewire. The catheter was then inserted through the peel-away sheath and the peel-away sheath was removed. The catheter was then attached to the port and the port placed in its subcutaneous pocket. Adequate positioning was confirmed by fluoroscopy. Good backflow of blood was noted on aspiration of the port. The port was flushed with heparin flush. Subcutaneous layer was reapproximated using a 3-0 Vicryl interrupted suture. The skin was closed using a 4-0 Vicryl subcuticular suture. Dermabond was applied.  All tape and needle counts were correct at the end of the procedure. The patient was awakened and transferred to PACU in stable condition. A chest x-ray will be performed at that time.  Complications:  None  EBL:  Minimal  Specimen:  None

## 2016-07-07 NOTE — Discharge Instructions (Signed)
Implanted Port Home Guide An implanted port is a type of central line that is placed under the skin. Central lines are used to provide IV access when treatment or nutrition needs to be given through a person's veins. Implanted ports are used for long-term IV access. An implanted port may be placed because:  You need IV medicine that would be irritating to the small veins in your hands or arms.  You need long-term IV medicines, such as antibiotics.  You need IV nutrition for a long period.  You need frequent blood draws for lab tests.  You need dialysis.  Implanted ports are usually placed in the chest area, but they can also be placed in the upper arm, the abdomen, or the leg. An implanted port has two main parts:  Reservoir. The reservoir is round and will appear as a small, raised area under your skin. The reservoir is the part where a needle is inserted to give medicines or draw blood.  Catheter. The catheter is a thin, flexible tube that extends from the reservoir. The catheter is placed into a large vein. Medicine that is inserted into the reservoir goes into the catheter and then into the vein.  How will I care for my incision site? Do not get the incision site wet. Bathe or shower as directed by your health care provider. How is my port accessed? Special steps must be taken to access the port:  Before the port is accessed, a numbing cream can be placed on the skin. This helps numb the skin over the port site.  Your health care provider uses a sterile technique to access the port. ? Your health care provider must put on a mask and sterile gloves. ? The skin over your port is cleaned carefully with an antiseptic and allowed to dry. ? The port is gently pinched between sterile gloves, and a needle is inserted into the port.  Only "non-coring" port needles should be used to access the port. Once the port is accessed, a blood return should be checked. This helps ensure that the port  is in the vein and is not clogged.  If your port needs to remain accessed for a constant infusion, a clear (transparent) bandage will be placed over the needle site. The bandage and needle will need to be changed every week, or as directed by your health care provider.  Keep the bandage covering the needle clean and dry. Do not get it wet. Follow your health care provider's instructions on how to take a shower or bath while the port is accessed.  If your port does not need to stay accessed, no bandage is needed over the port.  What is flushing? Flushing helps keep the port from getting clogged. Follow your health care provider's instructions on how and when to flush the port. Ports are usually flushed with saline solution or a medicine called heparin. The need for flushing will depend on how the port is used.  If the port is used for intermittent medicines or blood draws, the port will need to be flushed: ? After medicines have been given. ? After blood has been drawn. ? As part of routine maintenance.  If a constant infusion is running, the port may not need to be flushed.  How long will my port stay implanted? The port can stay in for as long as your health care provider thinks it is needed. When it is time for the port to come out, surgery will be   done to remove it. The procedure is similar to the one performed when the port was put in. When should I seek immediate medical care? When you have an implanted port, you should seek immediate medical care if:  You notice a bad smell coming from the incision site.  You have swelling, redness, or drainage at the incision site.  You have more swelling or pain at the port site or the surrounding area.  You have a fever that is not controlled with medicine.  This information is not intended to replace advice given to you by your health care provider. Make sure you discuss any questions you have with your health care provider. Document  Released: 04/21/2005 Document Revised: 09/27/2015 Document Reviewed: 12/27/2012 Elsevier Interactive Patient Education  2017 Elsevier Inc.  

## 2016-07-07 NOTE — Interval H&P Note (Signed)
History and Physical Interval Note:  07/07/2016 7:53 AM  Charles [name]  has presented today for surgery, with the diagnosis of rectal cancer  The various methods of treatment have been discussed with the patient and family. After consideration of risks, benefits and other options for treatment, the patient has consented to  Procedure(s): INSERTION PORT-A-CATH (Right) as a surgical intervention .  The patient's history has been reviewed, patient examined, no change in status, stable for surgery.  I have reviewed the patient's chart and labs.  Questions were answered to the patient's satisfaction.     Aviva Signs

## 2016-07-07 NOTE — Anesthesia Postprocedure Evaluation (Signed)
Anesthesia Post Note  Patient: Charles [name]  Procedure(s) Performed: Procedure(s) (LRB): INSERTION PORT-A-CATH (Right)  Patient location during evaluation: PACU Anesthesia Type: MAC Level of consciousness: awake and alert and oriented Pain management: pain level controlled Vital Signs Assessment: post-procedure vital signs reviewed and stable Respiratory status: spontaneous breathing Cardiovascular status: blood pressure returned to baseline Postop Assessment: no signs of nausea or vomiting Anesthetic complications: no     Last Vitals:  Vitals:   07/07/16 0800 07/07/16 0815  BP: (!) 154/95 (!) 161/92  Resp: (!) 43 12  Temp:      Last Pain:  Vitals:   07/07/16 0710  TempSrc: Oral                 Artasia Thang

## 2016-07-07 NOTE — Transfer of Care (Signed)
Immediate Anesthesia Transfer of Care Note  Patient: Charles [name]  Procedure(s) Performed: Procedure(s): INSERTION PORT-A-CATH (Right)  Patient Location: PACU  Anesthesia Type:MAC  Level of Consciousness: awake  Airway & Oxygen Therapy: Patient Spontanous Breathing  Post-op Assessment: Report given to RN  Post vital signs: Reviewed  Last Vitals:  Vitals:   07/07/16 0800 07/07/16 0815  BP: (!) 154/95 (!) 161/92  Resp: (!) 43 12  Temp:      Last Pain:  Vitals:   07/07/16 0710  TempSrc: Oral      Patients Stated Pain Goal: 8 (12/45/80 9983)  Complications: No apparent anesthesia complications

## 2016-07-08 ENCOUNTER — Encounter (HOSPITAL_COMMUNITY): Payer: Medicare Other

## 2016-07-08 ENCOUNTER — Encounter (HOSPITAL_COMMUNITY): Payer: Medicare Other | Attending: Oncology

## 2016-07-08 ENCOUNTER — Other Ambulatory Visit (HOSPITAL_COMMUNITY): Payer: Self-pay | Admitting: Oncology

## 2016-07-08 VITALS — BP 138/72 | HR 78 | Temp 97.7°F | Resp 18 | Wt 206.8 lb

## 2016-07-08 DIAGNOSIS — C20 Malignant neoplasm of rectum: Secondary | ICD-10-CM

## 2016-07-08 DIAGNOSIS — Z5111 Encounter for antineoplastic chemotherapy: Secondary | ICD-10-CM

## 2016-07-08 LAB — CBC WITH DIFFERENTIAL/PLATELET
Basophils Absolute: 0 10*3/uL (ref 0.0–0.1)
Basophils Relative: 0 %
Eosinophils Absolute: 0.1 10*3/uL (ref 0.0–0.7)
Eosinophils Relative: 2 %
HCT: 38.5 % — ABNORMAL LOW (ref 39.0–52.0)
Hemoglobin: 13.2 g/dL (ref 13.0–17.0)
Lymphocytes Relative: 31 %
Lymphs Abs: 2 10*3/uL (ref 0.7–4.0)
MCH: 29.5 pg (ref 26.0–34.0)
MCHC: 34.3 g/dL (ref 30.0–36.0)
MCV: 86.1 fL (ref 78.0–100.0)
Monocytes Absolute: 0.4 10*3/uL (ref 0.1–1.0)
Monocytes Relative: 6 %
Neutro Abs: 3.9 10*3/uL (ref 1.7–7.7)
Neutrophils Relative %: 61 %
Platelets: 194 10*3/uL (ref 150–400)
RBC: 4.47 MIL/uL (ref 4.22–5.81)
RDW: 13.4 % (ref 11.5–15.5)
WBC: 6.4 10*3/uL (ref 4.0–10.5)

## 2016-07-08 LAB — COMPREHENSIVE METABOLIC PANEL
ALT: 20 U/L (ref 17–63)
AST: 18 U/L (ref 15–41)
Albumin: 3.6 g/dL (ref 3.5–5.0)
Alkaline Phosphatase: 73 U/L (ref 38–126)
Anion gap: 9 (ref 5–15)
BUN: 15 mg/dL (ref 6–20)
CO2: 23 mmol/L (ref 22–32)
Calcium: 8.4 mg/dL — ABNORMAL LOW (ref 8.9–10.3)
Chloride: 106 mmol/L (ref 101–111)
Creatinine, Ser: 0.74 mg/dL (ref 0.61–1.24)
GFR calc Af Amer: >60 mL/min (ref >60)
GFR calc non Af Amer: >60 mL/min (ref >60)
Glucose, Bld: 223 mg/dL — ABNORMAL HIGH (ref 65–99)
Potassium: 3.9 mmol/L (ref 3.5–5.1)
Sodium: 138 mmol/L (ref 135–145)
Total Bilirubin: 1.5 mg/dL — ABNORMAL HIGH (ref 0.3–1.2)
Total Protein: 6.1 g/dL — ABNORMAL LOW (ref 6.5–8.1)

## 2016-07-08 MED ORDER — DEXAMETHASONE SODIUM PHOSPHATE 10 MG/ML IJ SOLN
10.0000 mg | Freq: Once | INTRAMUSCULAR | Status: AC
  Administered 2016-07-08: 10 mg via INTRAVENOUS

## 2016-07-08 MED ORDER — DEXAMETHASONE SODIUM PHOSPHATE 10 MG/ML IJ SOLN
INTRAMUSCULAR | Status: AC
  Filled 2016-07-08: qty 1

## 2016-07-08 MED ORDER — PALONOSETRON HCL INJECTION 0.25 MG/5ML
INTRAVENOUS | Status: AC
  Filled 2016-07-08: qty 5

## 2016-07-08 MED ORDER — FLUOROURACIL CHEMO INJECTION 2.5 GM/50ML
400.0000 mg/m2 | Freq: Once | INTRAVENOUS | Status: AC
  Administered 2016-07-08: 900 mg via INTRAVENOUS
  Filled 2016-07-08: qty 18

## 2016-07-08 MED ORDER — SODIUM CHLORIDE 0.9 % IV SOLN
2400.0000 mg/m2 | INTRAVENOUS | Status: DC
  Administered 2016-07-08: 5300 mg via INTRAVENOUS
  Filled 2016-07-08: qty 106

## 2016-07-08 MED ORDER — PALONOSETRON HCL INJECTION 0.25 MG/5ML
0.2500 mg | Freq: Once | INTRAVENOUS | Status: AC
  Administered 2016-07-08: 0.25 mg via INTRAVENOUS

## 2016-07-08 MED ORDER — SODIUM CHLORIDE 0.9 % IV SOLN: 10.0000 mg | Freq: Once | INTRAVENOUS | Status: DC

## 2016-07-08 MED ORDER — OXALIPLATIN CHEMO INJECTION 100 MG/20ML
85.0000 mg/m2 | Freq: Once | INTRAVENOUS | Status: AC
  Administered 2016-07-08: 185 mg via INTRAVENOUS
  Filled 2016-07-08: qty 37

## 2016-07-08 MED ORDER — LEUCOVORIN CALCIUM INJECTION 350 MG
400.0000 mg/m2 | Freq: Once | INTRAVENOUS | Status: AC
  Administered 2016-07-08: 880 mg via INTRAVENOUS
  Filled 2016-07-08: qty 44

## 2016-07-08 MED ORDER — DEXTROSE 5 % IV SOLN
Freq: Once | INTRAVENOUS | Status: AC
  Administered 2016-07-08: 09:00:00 via INTRAVENOUS

## 2016-07-08 NOTE — Progress Notes (Signed)
Tolerated chemo well. Continuous infusion pump intact. Stable and ambulatory on discharge home with wife. 

## 2016-07-08 NOTE — Patient Instructions (Signed)
[facility] Discharge Instructions for Patients Receiving Chemotherapy   Beginning January 23rd 2017 lab work for the [facility] will be done in the  Main lab at [facility] on 1st floor. If you have a lab appointment with the [facility] please come in thru the  Main Entrance and check in at the main information desk   Today you received the following chemotherapy agents leucovorin, oxaliplatin and 47f pump.  To help prevent nausea and vomiting after your treatment, we encourage you to take your nausea medication as instructed.   If you develop nausea and vomiting, or diarrhea that is not controlled by your medication, call the clinic.  The clinic phone number is [phone] Office hours are Monday-Friday 8:30am-5:00pm.  BELOW ARE SYMPTOMS THAT SHOULD BE REPORTED IMMEDIATELY:  *FEVER GREATER THAN 101.0 F  *CHILLS WITH OR WITHOUT FEVER  NAUSEA AND VOMITING THAT IS NOT CONTROLLED WITH YOUR NAUSEA MEDICATION  *UNUSUAL SHORTNESS OF BREATH  *UNUSUAL BRUISING OR BLEEDING  TENDERNESS IN MOUTH AND THROAT WITH OR WITHOUT PRESENCE OF ULCERS  *URINARY PROBLEMS  *BOWEL PROBLEMS  UNUSUAL RASH Items with * indicate a potential emergency and should be followed up as soon as possible. If you have an emergency after office hours please contact your primary care physician or go to the nearest emergency department.  Please call the clinic during office hours if you have any questions or concerns.   You may also contact the Patient Navigator at [phone] you have any questions or need assistance in obtaining follow up care.      Resources For Cancer Patients and their Caregivers ? American Cancer Society: Can assist with transportation, wigs, general needs, runs Look Good Feel Better.        1(709)004-7722? Cancer Care: Provides financial assistance, online support groups, medication/co-pay assistance.  1-800-813-HOPE (202-601-0922 ? [employer] [place] cancer patients and their families through emotional , educational and financial support.  3(854)339-0146? [place] DSS Where to apply for food stamps, Medicaid and utility assistance. 3408-473-3920? RCATS: Transportation to medical appointments. 3(919)302-9451? Social Security Administration: May apply for disability if have a Stage IV cancer. [phone])050-6973? [employer] and [employer] Services: Assists with nutrition, care and transit needs. [phone]

## 2016-07-09 ENCOUNTER — Encounter (HOSPITAL_COMMUNITY): Payer: Self-pay | Admitting: General Surgery

## 2016-07-09 LAB — CEA: CEA: 3.9 ng/mL (ref 0.0–4.7)

## 2016-07-10 ENCOUNTER — Encounter (HOSPITAL_BASED_OUTPATIENT_CLINIC_OR_DEPARTMENT_OTHER): Payer: Medicare Other

## 2016-07-10 VITALS — BP 119/63 | HR 78 | Temp 98.1°F | Resp 18

## 2016-07-10 DIAGNOSIS — Z452 Encounter for adjustment and management of vascular access device: Secondary | ICD-10-CM | POA: Diagnosis not present

## 2016-07-10 DIAGNOSIS — C20 Malignant neoplasm of rectum: Secondary | ICD-10-CM | POA: Diagnosis not present

## 2016-07-10 MED ORDER — HEPARIN SOD (PORK) LOCK FLUSH 100 UNIT/ML IV SOLN
500.0000 [IU] | Freq: Once | INTRAVENOUS | Status: AC | PRN
  Administered 2016-07-10: 500 [IU]
  Filled 2016-07-10: qty 5

## 2016-07-10 MED ORDER — FIRST-DUKES MOUTHWASH MT SUSP: 5.0000 mL | Freq: Four times a day (QID) | OROMUCOSAL | 3 refills | Status: DC

## 2016-07-10 MED ORDER — SODIUM CHLORIDE 0.9% FLUSH
10.0000 mL | INTRAVENOUS | Status: DC | PRN
  Administered 2016-07-10: 10 mL
  Filled 2016-07-10: qty 10

## 2016-07-10 NOTE — Patient Instructions (Signed)
[place] at [facility] Discharge Instructions  RECOMMENDATIONS MADE BY THE CONSULTANT AND ANY TEST RESULTS WILL BE SENT TO YOUR REFERRING PHYSICIAN.  Your pump was removed today.  Return as scheduled.   Thank you for choosing [place] at [facility] to provide your oncology and hematology care.  To afford each patient quality time with our provider, please arrive at least 15 minutes before your scheduled appointment time.    If you have a lab appointment with the [facility] please come in thru the  Main Entrance and check in at the main information desk  You need to re-schedule your appointment should you arrive 10 or more minutes late.  We strive to give you quality time with our providers, and arriving late affects you and other patients whose appointments are after yours.  Also, if you no show three or more times for appointments you may be dismissed from the clinic at the providers discretion.     Again, thank you for choosing [facility].  Our hope is that these requests will decrease the amount of time that you wait before being seen by our physicians.       _____________________________________________________________  Should you have questions after your visit to [facility], please contact our office at [phone] between the hours of 8:30 a.m. and 4:30 p.m.  Voicemails left after 4:30 p.m. will not be returned until the following business day.  For prescription refill requests, have your pharmacy contact our office.       Resources For Cancer Patients and their Caregivers ? American Cancer Society: Can assist with transportation, wigs, general needs, runs Look Good Feel Better.        [phone] ? Cancer Care: Provides financial assistance, online support groups, medication/co-pay assistance.  1-800-813-HOPE 780-852-8327) ? [place] Assists [place] cancer patients  and their families through emotional , educational and financial support.  409-646-4088 ? [place] DSS Where to apply for food stamps, Medicaid and utility assistance. 701-087-6901 ? RCATS: Transportation to medical appointments. 506-639-5726 ? Social Security Administration: May apply for disability if have a Stage IV cancer. 8645335977 5147535691 ? [employer], Disability and Transit Services: Assists with nutrition, care and transit needs. [place] Support Programs: '@10RELATIVEDAYS'$ @ > Cancer Support Group  2nd Tuesday of the month 1pm-2pm, Journey Room  > Creative Journey  3rd Tuesday of the month 1130am-1pm, Journey Room  > Look Good Feel Better  1st Wednesday of the month 10am-12 noon, Journey Room (Call [place] to register [phone])

## 2016-07-11 ENCOUNTER — Telehealth (HOSPITAL_COMMUNITY): Payer: Self-pay

## 2016-07-11 NOTE — Telephone Encounter (Signed)
Patients wife called and left message stating patient was doing much better today. I called her back and she said patient was drinking a lot of fluids and controlling the nausea with his "pills". He is also using the magic mouthwash. Instructed wife anytime she thought he might need fluids to call as early as possible. She verbalized understanding.

## 2016-07-14 ENCOUNTER — Telehealth (HOSPITAL_COMMUNITY): Payer: Self-pay

## 2016-07-14 ENCOUNTER — Encounter (HOSPITAL_BASED_OUTPATIENT_CLINIC_OR_DEPARTMENT_OTHER): Payer: Medicare Other | Admitting: Oncology

## 2016-07-14 ENCOUNTER — Encounter (HOSPITAL_COMMUNITY): Payer: Self-pay | Admitting: Oncology

## 2016-07-14 VITALS — BP 150/80 | HR 81 | Temp 97.5°F | Resp 16 | Ht 74.0 in | Wt 201.0 lb

## 2016-07-14 DIAGNOSIS — K6289 Other specified diseases of anus and rectum: Secondary | ICD-10-CM

## 2016-07-14 DIAGNOSIS — T451X5A Adverse effect of antineoplastic and immunosuppressive drugs, initial encounter: Secondary | ICD-10-CM

## 2016-07-14 DIAGNOSIS — G620 Drug-induced polyneuropathy: Secondary | ICD-10-CM

## 2016-07-14 MED ORDER — LIDOCAINE 2 % EX GEL: 1.0000 "application " | Freq: Four times a day (QID) | CUTANEOUS | 1 refills | Status: DC | PRN

## 2016-07-14 MED ORDER — ZINC OXIDE 20 % EX OINT: 1.0000 "application " | TOPICAL_OINTMENT | CUTANEOUS | 1 refills | Status: DC | PRN

## 2016-07-14 MED ORDER — GABAPENTIN 300 MG PO CAPS: 900.0000 mg | ORAL_CAPSULE | Freq: Two times a day (BID) | ORAL | 1 refills | Status: DC

## 2016-07-14 NOTE — Telephone Encounter (Signed)
Patients wife called stating patient rectum is swollen and he has blisters on his bottom. His stool is soft but he has a hard time passing it due to the pain. Reviewed with PA-C and RN. Called patient back and instructed them to come in as soon as they safely can. (the weather is bad) Wife states they will come but it may take a while. Explained this was okay. Appointment made for today.

## 2016-07-14 NOTE — Progress Notes (Signed)
Patient is seen as a work-in today as he called with complaints of rectal sores.  Patient reports rectal pain only with bowel movements.  He notes normal bowel movements the frequency of bowel movements is increased.  He denies any watery stools or loose stools.  He just describes the rectal discomfort as "burning," "feels like flames on my tail," and "feels like pulling barbed wired" when he passes stool.  He otherwise denies any rectal discomfort other than palpation.  His wife reports that his bowel movements have a foul odor.  Patient denies any recent hospitalizations or antibiotic needs.  BP (!) 150/80 (BP Location: Left Arm, Patient Position: Sitting)   Pulse 81   Temp 97.5 F (36.4 C) (Oral)   Resp 16   Ht '6\' 2"'$  (1.88 m)   Wt 201 lb (91.2 kg)   SpO2 97%   BMI 25.81 kg/m  Gen: Pleasant, accompanied by wife, no acute distress. HEENT: Atraumatic, normocephalic Skin: Warm and dry Neuro: A and O x 3. Ano/rectum: Erythema without induration.  Mucous notes perianally.  Tenderness to spreading of buttocks.  No blood.  No lesions.  Rectal exam deferred today.  Assessment: 1. Anorectal pain with passing of stool only. 2. Perianal irritation 3. Increased frequency of BM WITHOUT loose/watery stools.  Plan: 1. I presume anal fissure based upon symptoms. 2. I recommend the following:  A. Lidocaine gel prior to BM.  Rx is printed  B. Zinc Oxide cream for skin protection  C. Continue Metamucil daily to increase fiber  D. Warm sitz baths multiple times per day  E. Soft wet toilettes for perianal/perineal care  F. Increae H2O intake 3. Return for follow-up on Friday.  If feeling significant better, he can cancel Friday follow-up appointment.  Patient and plan discussed with Dr. Twana First and she is in agreement with the aforementioned.   KEFALAS,THOMAS, PA-C 07/14/2016 2:16 PM

## 2016-07-14 NOTE — Patient Instructions (Signed)
[place] at [facility] Discharge Instructions  RECOMMENDATIONS MADE BY THE CONSULTANT AND ANY TEST RESULTS WILL BE SENT TO YOUR REFERRING PHYSICIAN.  You were seen today by Kirby Crigler PA-C. Refilled Gabapentin.  Rx given for Lidocaine gel PRN about 5 minutes prior to BM, and Zinc oxide PRN to outside area of anus. Warm sitz baths PRN. Continue Metamucil daily and increase your water intake. Return as scheduled.   Thank you for choosing [place] at [facility] to provide your oncology and hematology care.  To afford each patient quality time with our provider, please arrive at least 15 minutes before your scheduled appointment time.    If you have a lab appointment with the [facility] please come in thru the  Main Entrance and check in at the main information desk  You need to re-schedule your appointment should you arrive 10 or more minutes late.  We strive to give you quality time with our providers, and arriving late affects you and other patients whose appointments are after yours.  Also, if you no show three or more times for appointments you may be dismissed from the clinic at the providers discretion.     Again, thank you for choosing [facility].  Our hope is that these requests will decrease the amount of time that you wait before being seen by our physicians.       _____________________________________________________________  Should you have questions after your visit to [facility], please contact our office at [phone] between the hours of 8:30 a.m. and 4:30 p.m.  Voicemails left after 4:30 p.m. will not be returned until the following business day.  For prescription refill requests, have your pharmacy contact our office.       Resources For Cancer Patients and their Caregivers ? American Cancer Society: Can assist with transportation, wigs, general needs, runs Look Good Feel Better.         912-880-4791 ? Cancer Care: Provides financial assistance, online support groups, medication/co-pay assistance.  1-800-813-HOPE 530-617-7562) ? [place] Assists [place] cancer patients and their families through emotional , educational and financial support.  201-550-8344 ? [place] DSS Where to apply for food stamps, Medicaid and utility assistance. 563-257-9000 ? RCATS: Transportation to medical appointments. [phone])337-2725 ? Social Security Administration: May apply for disability if have a Stage IV cancer. 443-711-2436 631-681-0801 ? [employer], Disability and Transit Services: Assists with nutrition, care and transit needs. [place] Support Programs: '@10RELATIVEDAYS'$ @ > Cancer Support Group  2nd Tuesday of the month 1pm-2pm, Journey Room  > Creative Journey  3rd Tuesday of the month 1130am-1pm, Journey Room  > Look Good Feel Better  1st Wednesday of the month 10am-12 noon, Journey Room (Call [place] to register [phone])

## 2016-07-16 ENCOUNTER — Other Ambulatory Visit (HOSPITAL_COMMUNITY): Payer: Self-pay

## 2016-07-16 DIAGNOSIS — T451X5A Adverse effect of antineoplastic and immunosuppressive drugs, initial encounter: Principal | ICD-10-CM

## 2016-07-16 DIAGNOSIS — G620 Drug-induced polyneuropathy: Secondary | ICD-10-CM

## 2016-07-16 MED ORDER — GABAPENTIN 300 MG PO CAPS: 900.0000 mg | ORAL_CAPSULE | Freq: Two times a day (BID) | ORAL | 1 refills | Status: DC

## 2016-07-18 ENCOUNTER — Ambulatory Visit (HOSPITAL_COMMUNITY): Payer: Medicare Other | Admitting: Adult Health

## 2016-07-18 ENCOUNTER — Other Ambulatory Visit (HOSPITAL_COMMUNITY): Payer: Medicare Other

## 2016-07-22 ENCOUNTER — Encounter (HOSPITAL_BASED_OUTPATIENT_CLINIC_OR_DEPARTMENT_OTHER): Payer: Medicare Other

## 2016-07-22 ENCOUNTER — Encounter (HOSPITAL_COMMUNITY): Payer: Self-pay

## 2016-07-22 VITALS — BP 140/75 | HR 60 | Temp 97.5°F | Resp 18 | Wt 206.2 lb

## 2016-07-22 DIAGNOSIS — Z5111 Encounter for antineoplastic chemotherapy: Secondary | ICD-10-CM

## 2016-07-22 DIAGNOSIS — T451X5A Adverse effect of antineoplastic and immunosuppressive drugs, initial encounter: Secondary | ICD-10-CM

## 2016-07-22 DIAGNOSIS — G620 Drug-induced polyneuropathy: Secondary | ICD-10-CM

## 2016-07-22 DIAGNOSIS — Z5112 Encounter for antineoplastic immunotherapy: Secondary | ICD-10-CM | POA: Diagnosis present

## 2016-07-22 DIAGNOSIS — C20 Malignant neoplasm of rectum: Secondary | ICD-10-CM

## 2016-07-22 LAB — URINALYSIS, DIPSTICK ONLY
Bilirubin Urine: NEGATIVE
Glucose, UA: 500 mg/dL — AB
Hgb urine dipstick: NEGATIVE
Ketones, ur: NEGATIVE mg/dL
Leukocytes, UA: NEGATIVE
Nitrite: NEGATIVE
Protein, ur: NEGATIVE mg/dL
Specific Gravity, Urine: 1.025 (ref 1.005–1.030)
pH: 5.5 (ref 5.0–8.0)

## 2016-07-22 LAB — COMPREHENSIVE METABOLIC PANEL
ALT: 25 U/L (ref 17–63)
AST: 23 U/L (ref 15–41)
Albumin: 3.3 g/dL — ABNORMAL LOW (ref 3.5–5.0)
Alkaline Phosphatase: 84 U/L (ref 38–126)
Anion gap: 6 (ref 5–15)
BUN: 11 mg/dL (ref 6–20)
CO2: 23 mmol/L (ref 22–32)
Calcium: 8.3 mg/dL — ABNORMAL LOW (ref 8.9–10.3)
Chloride: 107 mmol/L (ref 101–111)
Creatinine, Ser: 0.83 mg/dL (ref 0.61–1.24)
GFR calc Af Amer: >60 mL/min (ref >60)
GFR calc non Af Amer: >60 mL/min (ref >60)
Glucose, Bld: 298 mg/dL — ABNORMAL HIGH (ref 65–99)
Potassium: 3.8 mmol/L (ref 3.5–5.1)
Sodium: 136 mmol/L (ref 135–145)
Total Bilirubin: 1 mg/dL (ref 0.3–1.2)
Total Protein: 5.7 g/dL — ABNORMAL LOW (ref 6.5–8.1)

## 2016-07-22 LAB — CBC WITH DIFFERENTIAL/PLATELET
Basophils Absolute: 0 10*3/uL (ref 0.0–0.1)
Basophils Relative: 0 %
Eosinophils Absolute: 0.1 10*3/uL (ref 0.0–0.7)
Eosinophils Relative: 3 %
HCT: 35.6 % — ABNORMAL LOW (ref 39.0–52.0)
Hemoglobin: 12.2 g/dL — ABNORMAL LOW (ref 13.0–17.0)
Lymphocytes Relative: 33 %
Lymphs Abs: 1.5 10*3/uL (ref 0.7–4.0)
MCH: 29.9 pg (ref 26.0–34.0)
MCHC: 34.3 g/dL (ref 30.0–36.0)
MCV: 87.3 fL (ref 78.0–100.0)
Monocytes Absolute: 0.4 10*3/uL (ref 0.1–1.0)
Monocytes Relative: 8 %
Neutro Abs: 2.6 10*3/uL (ref 1.7–7.7)
Neutrophils Relative %: 56 %
Platelets: 157 10*3/uL (ref 150–400)
RBC: 4.08 MIL/uL — ABNORMAL LOW (ref 4.22–5.81)
RDW: 13.9 % (ref 11.5–15.5)
WBC: 4.6 10*3/uL (ref 4.0–10.5)

## 2016-07-22 MED ORDER — SODIUM CHLORIDE 0.9 % IV SOLN
5.0000 mg/kg | Freq: Once | INTRAVENOUS | Status: DC
  Filled 2016-07-22: qty 19

## 2016-07-22 MED ORDER — PALONOSETRON HCL INJECTION 0.25 MG/5ML
0.2500 mg | Freq: Once | INTRAVENOUS | Status: AC
  Administered 2016-07-22: 0.25 mg via INTRAVENOUS

## 2016-07-22 MED ORDER — DEXTROSE 5 % IV SOLN
400.0000 mg/m2 | Freq: Once | INTRAVENOUS | Status: AC
  Administered 2016-07-22: 880 mg via INTRAVENOUS
  Filled 2016-07-22: qty 44

## 2016-07-22 MED ORDER — SODIUM CHLORIDE 0.9 % IV SOLN
INTRAVENOUS | Status: DC
  Administered 2016-07-22: 10:00:00 via INTRAVENOUS

## 2016-07-22 MED ORDER — DEXAMETHASONE SODIUM PHOSPHATE 10 MG/ML IJ SOLN
10.0000 mg | Freq: Once | INTRAMUSCULAR | Status: AC
  Administered 2016-07-22: 10 mg via INTRAVENOUS

## 2016-07-22 MED ORDER — PALONOSETRON HCL INJECTION 0.25 MG/5ML
INTRAVENOUS | Status: AC
  Filled 2016-07-22: qty 5

## 2016-07-22 MED ORDER — SODIUM CHLORIDE 0.9 % IV SOLN
2400.0000 mg/m2 | INTRAVENOUS | Status: DC
  Administered 2016-07-22: 5300 mg via INTRAVENOUS
  Filled 2016-07-22: qty 100

## 2016-07-22 MED ORDER — SODIUM CHLORIDE 0.9 % IV SOLN
5.0000 mg/kg | Freq: Once | INTRAVENOUS | Status: AC
  Administered 2016-07-22: 475 mg via INTRAVENOUS
  Filled 2016-07-22: qty 3

## 2016-07-22 MED ORDER — DEXTROSE 5 % IV SOLN
Freq: Once | INTRAVENOUS | Status: AC
  Administered 2016-07-22: 13:00:00 via INTRAVENOUS

## 2016-07-22 MED ORDER — OXALIPLATIN CHEMO INJECTION 100 MG/20ML
85.0000 mg/m2 | Freq: Once | INTRAVENOUS | Status: AC
  Administered 2016-07-22: 185 mg via INTRAVENOUS
  Filled 2016-07-22: qty 37

## 2016-07-22 MED ORDER — FLUOROURACIL CHEMO INJECTION 2.5 GM/50ML
400.0000 mg/m2 | Freq: Once | INTRAVENOUS | Status: AC
  Administered 2016-07-22: 900 mg via INTRAVENOUS
  Filled 2016-07-22: qty 18

## 2016-07-22 MED ORDER — DEXAMETHASONE SODIUM PHOSPHATE 10 MG/ML IJ SOLN
INTRAMUSCULAR | Status: AC
  Filled 2016-07-22: qty 1

## 2016-07-22 MED ORDER — SODIUM CHLORIDE 0.9 % IV SOLN: 10.0000 mg | Freq: Once | INTRAVENOUS | Status: DC

## 2016-07-22 NOTE — Patient Instructions (Signed)
[facility] Discharge Instructions for Patients Receiving Chemotherapy   Beginning January 23rd 2017 lab work for the [facility] will be done in the  Main lab at [facility] on 1st floor. If you have a lab appointment with the [facility] please come in thru the  Main Entrance and check in at the main information desk   Today you received the following chemotherapy agents: Oxaliplatin, Avastin, and Fluorouracil.     If you develop nausea and vomiting, or diarrhea that is not controlled by your medication, call the clinic.  The clinic phone number is [phone]. Office hours are Monday-Friday 8:30am-5:00pm.  BELOW ARE SYMPTOMS THAT SHOULD BE REPORTED IMMEDIATELY:  *FEVER GREATER THAN 101.0 F  *CHILLS WITH OR WITHOUT FEVER  NAUSEA AND VOMITING THAT IS NOT CONTROLLED WITH YOUR NAUSEA MEDICATION  *UNUSUAL SHORTNESS OF BREATH  *UNUSUAL BRUISING OR BLEEDING  TENDERNESS IN MOUTH AND THROAT WITH OR WITHOUT PRESENCE OF ULCERS  *URINARY PROBLEMS  *BOWEL PROBLEMS  UNUSUAL RASH Items with * indicate a potential emergency and should be followed up as soon as possible. If you have an emergency after office hours please contact your primary care physician or go to the nearest emergency department.  Please call the clinic during office hours if you have any questions or concerns.   You may also contact the Patient Navigator at [phone] should you have any questions or need assistance in obtaining follow up care.      Resources For Cancer Patients and their Caregivers ? American Cancer Society: Can assist with transportation, wigs, general needs, runs Look Good Feel Better.        ([phone] ? Cancer Care: Provides financial assistance, online support groups, medication/co-pay assistance.  1-800-813-HOPE 779 790 1835) ? [place] Assists [place] cancer patients and their families through emotional , educational and financial  support.  904-813-2535 ? [place] DSS Where to apply for food stamps, Medicaid and utility assistance. 343-836-8740 ? RCATS: Transportation to medical appointments. 682-790-8176 ? Social Security Administration: May apply for disability if have a Stage IV cancer. 701-335-1970 469-784-3261 ? [employer], Disability and Transit Services: Assists with nutrition, care and transit needs. [phone]

## 2016-07-22 NOTE — Progress Notes (Signed)
Tolerated chemo well. Continuous infusion pump intact. Stable and ambulatory on discharge home with wife. 

## 2016-07-23 ENCOUNTER — Emergency Department (HOSPITAL_COMMUNITY): Payer: Medicare Other

## 2016-07-23 ENCOUNTER — Encounter (HOSPITAL_COMMUNITY): Payer: Self-pay | Admitting: *Deleted

## 2016-07-23 ENCOUNTER — Other Ambulatory Visit: Payer: Self-pay

## 2016-07-23 ENCOUNTER — Ambulatory Visit: Payer: Medicare Other | Admitting: Gastroenterology

## 2016-07-23 ENCOUNTER — Observation Stay (HOSPITAL_COMMUNITY)
Admission: EM | Admit: 2016-07-23 | Discharge: 2016-07-24 | Disposition: A | Discharge status: Home / Self Care | Payer: Medicare Other | Attending: Internal Medicine | Admitting: Internal Medicine

## 2016-07-23 DIAGNOSIS — Z85038 Personal history of other malignant neoplasm of large intestine: Secondary | ICD-10-CM | POA: Diagnosis not present

## 2016-07-23 DIAGNOSIS — R918 Other nonspecific abnormal finding of lung field: Secondary | ICD-10-CM | POA: Diagnosis not present

## 2016-07-23 DIAGNOSIS — J181 Lobar pneumonia, unspecified organism: Secondary | ICD-10-CM | POA: Diagnosis not present

## 2016-07-23 DIAGNOSIS — Z7984 Long term (current) use of oral hypoglycemic drugs: Secondary | ICD-10-CM | POA: Diagnosis not present

## 2016-07-23 DIAGNOSIS — C189 Malignant neoplasm of colon, unspecified: Secondary | ICD-10-CM

## 2016-07-23 DIAGNOSIS — Z79899 Other long term (current) drug therapy: Secondary | ICD-10-CM | POA: Diagnosis not present

## 2016-07-23 DIAGNOSIS — Z85048 Personal history of other malignant neoplasm of rectum, rectosigmoid junction, and anus: Secondary | ICD-10-CM | POA: Insufficient documentation

## 2016-07-23 DIAGNOSIS — I10 Essential (primary) hypertension: Secondary | ICD-10-CM | POA: Insufficient documentation

## 2016-07-23 DIAGNOSIS — E872 Acidosis, unspecified: Secondary | ICD-10-CM | POA: Diagnosis present

## 2016-07-23 DIAGNOSIS — J189 Pneumonia, unspecified organism: Secondary | ICD-10-CM | POA: Diagnosis present

## 2016-07-23 DIAGNOSIS — R0602 Shortness of breath: Secondary | ICD-10-CM | POA: Diagnosis not present

## 2016-07-23 DIAGNOSIS — E119 Type 2 diabetes mellitus without complications: Secondary | ICD-10-CM | POA: Diagnosis not present

## 2016-07-23 DIAGNOSIS — R509 Fever, unspecified: Secondary | ICD-10-CM | POA: Diagnosis present

## 2016-07-23 LAB — CBC WITH DIFFERENTIAL/PLATELET
Basophils Absolute: 0 10*3/uL (ref 0.0–0.1)
Basophils Absolute: 0 10*3/uL (ref 0.0–0.1)
Basophils Relative: 0 %
Basophils Relative: 0 %
Eosinophils Absolute: 0 10*3/uL (ref 0.0–0.7)
Eosinophils Absolute: 0 10*3/uL (ref 0.0–0.7)
Eosinophils Relative: 0 %
Eosinophils Relative: 0 %
HCT: 38.4 % — ABNORMAL LOW (ref 39.0–52.0)
HCT: 38.3 % — ABNORMAL LOW (ref 39.0–52.0)
Hemoglobin: 13.4 g/dL (ref 13.0–17.0)
Hemoglobin: 13.5 g/dL (ref 13.0–17.0)
Lymphocytes Relative: 2 %
Lymphocytes Relative: 2 %
Lymphs Abs: 0.2 10*3/uL — ABNORMAL LOW (ref 0.7–4.0)
Lymphs Abs: 0.1 10*3/uL — ABNORMAL LOW (ref 0.7–4.0)
MCH: 29.7 pg (ref 26.0–34.0)
MCH: 30.3 pg (ref 26.0–34.0)
MCHC: 34.9 g/dL (ref 30.0–36.0)
MCHC: 35.2 g/dL (ref 30.0–36.0)
MCV: 85.1 fL (ref 78.0–100.0)
MCV: 85.9 fL (ref 78.0–100.0)
Monocytes Absolute: 0.5 10*3/uL (ref 0.1–1.0)
Monocytes Absolute: 0.5 10*3/uL (ref 0.1–1.0)
Monocytes Relative: 6 %
Monocytes Relative: 7 %
Neutro Abs: 7.4 10*3/uL (ref 1.7–7.7)
Neutro Abs: 7.3 10*3/uL (ref 1.7–7.7)
Neutrophils Relative %: 92 %
Neutrophils Relative %: 91 %
Platelets: 149 10*3/uL — ABNORMAL LOW (ref 150–400)
Platelets: 163 10*3/uL (ref 150–400)
RBC: 4.51 MIL/uL (ref 4.22–5.81)
RBC: 4.46 MIL/uL (ref 4.22–5.81)
RDW: 13.6 % (ref 11.5–15.5)
RDW: 13.8 % (ref 11.5–15.5)
WBC: 8 10*3/uL (ref 4.0–10.5)
WBC: 7.9 10*3/uL (ref 4.0–10.5)

## 2016-07-23 LAB — URINALYSIS, ROUTINE W REFLEX MICROSCOPIC
Bilirubin Urine: NEGATIVE
Glucose, UA: NEGATIVE mg/dL
Hgb urine dipstick: NEGATIVE
Ketones, ur: NEGATIVE mg/dL
Leukocytes, UA: NEGATIVE
Nitrite: NEGATIVE
Protein, ur: NEGATIVE mg/dL
Specific Gravity, Urine: 1.025 (ref 1.005–1.030)
pH: 5.5 (ref 5.0–8.0)

## 2016-07-23 LAB — COMPREHENSIVE METABOLIC PANEL
ALT: 28 U/L (ref 17–63)
AST: 27 U/L (ref 15–41)
Albumin: 3.7 g/dL (ref 3.5–5.0)
Alkaline Phosphatase: 75 U/L (ref 38–126)
Anion gap: 10 (ref 5–15)
BUN: 13 mg/dL (ref 6–20)
CO2: 21 mmol/L — ABNORMAL LOW (ref 22–32)
Calcium: 9.1 mg/dL (ref 8.9–10.3)
Chloride: 105 mmol/L (ref 101–111)
Creatinine, Ser: 0.9 mg/dL (ref 0.61–1.24)
GFR calc Af Amer: >60 mL/min (ref >60)
GFR calc non Af Amer: >60 mL/min (ref >60)
Glucose, Bld: 193 mg/dL — ABNORMAL HIGH (ref 65–99)
Potassium: 3.7 mmol/L (ref 3.5–5.1)
Sodium: 136 mmol/L (ref 135–145)
Total Bilirubin: 1.4 mg/dL — ABNORMAL HIGH (ref 0.3–1.2)
Total Protein: 6.7 g/dL (ref 6.5–8.1)

## 2016-07-23 LAB — STREP PNEUMONIAE URINARY ANTIGEN: Strep Pneumo Urinary Antigen: NEGATIVE

## 2016-07-23 LAB — GLUCOSE, CAPILLARY
Glucose-Capillary: 172 mg/dL — ABNORMAL HIGH (ref 65–99)
Glucose-Capillary: 109 mg/dL — ABNORMAL HIGH (ref 65–99)
Glucose-Capillary: 185 mg/dL — ABNORMAL HIGH (ref 65–99)
Glucose-Capillary: 171 mg/dL — ABNORMAL HIGH (ref 65–99)

## 2016-07-23 LAB — LACTIC ACID, PLASMA: Lactic Acid, Venous: 2.9 mmol/L (ref 0.5–1.9)

## 2016-07-23 LAB — I-STAT CG4 LACTIC ACID, ED: Lactic Acid, Venous: 3.19 mmol/L (ref 0.5–1.9)

## 2016-07-23 MED ORDER — HYDROCODONE-ACETAMINOPHEN 5-325 MG PO TABS: 1.0000 | ORAL_TABLET | Freq: Four times a day (QID) | ORAL | Status: DC | PRN

## 2016-07-23 MED ORDER — ALBUTEROL SULFATE (2.5 MG/3ML) 0.083% IN NEBU: 2.5000 mg | INHALATION_SOLUTION | RESPIRATORY_TRACT | Status: DC | PRN

## 2016-07-23 MED ORDER — ACETAMINOPHEN 325 MG PO TABS
650.0000 mg | ORAL_TABLET | Freq: Once | ORAL | Status: AC
  Administered 2016-07-23: 650 mg via ORAL

## 2016-07-23 MED ORDER — DEXAMETHASONE 4 MG PO TABS
8.0000 mg | ORAL_TABLET | Freq: Every day | ORAL | Status: DC
  Filled 2016-07-23: qty 2

## 2016-07-23 MED ORDER — CLONAZEPAM 0.5 MG PO TABS: 0.5000 mg | ORAL_TABLET | Freq: Two times a day (BID) | ORAL | Status: DC | PRN

## 2016-07-23 MED ORDER — DEXTROSE 5 % IV SOLN
1.0000 g | INTRAVENOUS | Status: DC
  Administered 2016-07-23 – 2016-07-24 (×2): 1 g via INTRAVENOUS
  Filled 2016-07-23 (×3): qty 10

## 2016-07-23 MED ORDER — ENSURE ENLIVE PO LIQD
237.0000 mL | Freq: Two times a day (BID) | ORAL | Status: DC
  Administered 2016-07-23 (×2): 237 mL via ORAL

## 2016-07-23 MED ORDER — GUAIFENESIN ER 600 MG PO TB12
1200.0000 mg | ORAL_TABLET | Freq: Two times a day (BID) | ORAL | Status: DC
  Administered 2016-07-23 – 2016-07-24 (×2): 1200 mg via ORAL
  Filled 2016-07-23 (×2): qty 2

## 2016-07-23 MED ORDER — ACETAMINOPHEN 325 MG PO TABS: 650.0000 mg | ORAL_TABLET | Freq: Four times a day (QID) | ORAL | Status: DC | PRN

## 2016-07-23 MED ORDER — DEXTROSE 5 % IV SOLN
INTRAVENOUS | Status: AC
  Filled 2016-07-23: qty 500

## 2016-07-23 MED ORDER — RIVAROXABAN 20 MG PO TABS
20.0000 mg | ORAL_TABLET | Freq: Every day | ORAL | Status: DC
  Administered 2016-07-23: 20 mg via ORAL
  Filled 2016-07-23: qty 1

## 2016-07-23 MED ORDER — DEXTROSE 5 % IV SOLN
INTRAVENOUS | Status: AC
  Filled 2016-07-23: qty 10

## 2016-07-23 MED ORDER — DEXTROSE 5 % IV SOLN
2.0000 g | Freq: Once | INTRAVENOUS | Status: AC
  Administered 2016-07-23: 2 g via INTRAVENOUS
  Filled 2016-07-23: qty 2

## 2016-07-23 MED ORDER — GABAPENTIN 300 MG PO CAPS
900.0000 mg | ORAL_CAPSULE | Freq: Two times a day (BID) | ORAL | Status: DC
  Administered 2016-07-23 – 2016-07-24 (×3): 900 mg via ORAL
  Filled 2016-07-23 (×4): qty 3

## 2016-07-23 MED ORDER — SODIUM CHLORIDE 0.9% FLUSH: 3.0000 mL | INTRAVENOUS | Status: DC | PRN

## 2016-07-23 MED ORDER — ACETAMINOPHEN 325 MG PO TABS
ORAL_TABLET | ORAL | Status: AC
  Filled 2016-07-23: qty 2

## 2016-07-23 MED ORDER — MIRTAZAPINE 15 MG PO TABS
7.5000 mg | ORAL_TABLET | Freq: Every day | ORAL | Status: DC
  Administered 2016-07-23: 7.5 mg via ORAL
  Filled 2016-07-23: qty 1

## 2016-07-23 MED ORDER — SODIUM CHLORIDE 0.9% FLUSH
3.0000 mL | Freq: Two times a day (BID) | INTRAVENOUS | Status: DC
  Administered 2016-07-23: 3 mL via INTRAVENOUS

## 2016-07-23 MED ORDER — SODIUM CHLORIDE 0.9 % IV BOLUS (SEPSIS)
1000.0000 mL | Freq: Once | INTRAVENOUS | Status: AC
  Administered 2016-07-23: 1000 mL via INTRAVENOUS

## 2016-07-23 MED ORDER — INSULIN ASPART 100 UNIT/ML ~~LOC~~ SOLN
0.0000 [IU] | Freq: Three times a day (TID) | SUBCUTANEOUS | Status: DC
  Administered 2016-07-23: 2 [IU] via SUBCUTANEOUS
  Administered 2016-07-23 – 2016-07-24 (×2): 3 [IU] via SUBCUTANEOUS
  Administered 2016-07-24: 2 [IU] via SUBCUTANEOUS

## 2016-07-23 MED ORDER — IPRATROPIUM-ALBUTEROL 0.5-2.5 (3) MG/3ML IN SOLN
3.0000 mL | Freq: Four times a day (QID) | RESPIRATORY_TRACT | Status: DC
  Administered 2016-07-24: 3 mL via RESPIRATORY_TRACT
  Filled 2016-07-23: qty 3

## 2016-07-23 MED ORDER — IPRATROPIUM-ALBUTEROL 0.5-2.5 (3) MG/3ML IN SOLN
3.0000 mL | Freq: Four times a day (QID) | RESPIRATORY_TRACT | Status: DC
  Administered 2016-07-23: 3 mL via RESPIRATORY_TRACT
  Filled 2016-07-23: qty 3

## 2016-07-23 MED ORDER — SODIUM CHLORIDE 0.9 % IV BOLUS (SEPSIS): 1000.0000 mL | Freq: Once | INTRAVENOUS | Status: DC

## 2016-07-23 MED ORDER — TAMSULOSIN HCL 0.4 MG PO CAPS
0.4000 mg | ORAL_CAPSULE | Freq: Every day | ORAL | Status: DC
  Administered 2016-07-23: 0.4 mg via ORAL
  Filled 2016-07-23: qty 1

## 2016-07-23 MED ORDER — PANTOPRAZOLE SODIUM 40 MG PO TBEC
40.0000 mg | DELAYED_RELEASE_TABLET | Freq: Two times a day (BID) | ORAL | Status: DC
  Administered 2016-07-23 – 2016-07-24 (×2): 40 mg via ORAL
  Filled 2016-07-23 (×2): qty 1

## 2016-07-23 MED ORDER — SODIUM CHLORIDE 0.9 % IV SOLN: 250.0000 mL | INTRAVENOUS | Status: DC | PRN

## 2016-07-23 MED ORDER — DEXTROSE 5 % IV SOLN
500.0000 mg | INTRAVENOUS | Status: DC
  Administered 2016-07-23 – 2016-07-24 (×2): 500 mg via INTRAVENOUS
  Filled 2016-07-23 (×3): qty 500

## 2016-07-23 MED ORDER — SODIUM CHLORIDE 0.45 % IV SOLN
INTRAVENOUS | Status: DC
  Administered 2016-07-23 – 2016-07-24 (×2): via INTRAVENOUS

## 2016-07-23 NOTE — Progress Notes (Signed)
Patient admitted to the hospital earlier this morning by Dr. Shanon Brow  Patient seen and examined. He is feeling significantly better today. Denies any shortness of breath or cough. Did have a low-grade fever today as well as on admission. Lactic acid remains elevated despite IV fluids. Will need to continue on hydration. He is on antibiotics for pneumonia. We'll repeat lactic acid in a.m. Continue pulmonary hygiene. Blood cultures have been sent and will be followed up.  Analysia Dungee

## 2016-07-23 NOTE — Care Management Obs Status (Signed)
[facility] NOTIFICATION   Patient Details  Name: JEP DYAS MRN: [id number] Date of Birth: 03-May-1951   Medicare Observation Status Notification Given:  Yes    Sherald Barge, RN 07/23/2016, 2:27 PM

## 2016-07-23 NOTE — Care Management Note (Signed)
Case Management Note  Patient Details  Name: Charles [name] MRN: [id number] Date of Birth: 12-21-1950  Subjective/Objective:                  Pt admitted with CAP. He is ind with ADL's. He has PCP, transportation and no difficulty affording or managing medications. He has no HH or DM needs.   Action/Plan: Plan to DC home with self care. No needs communicated.   Expected Discharge Date:     07/24/2016             Expected Discharge Plan:  Home/Self Care  In-House Referral:  NA  Discharge planning Services  CM Consult  Post Acute Care Choice:  NA Choice offered to:  NA  Status of Service:  Completed, signed off  Sherald Barge, RN 07/23/2016, 2:30 PM

## 2016-07-23 NOTE — Progress Notes (Signed)
Initial Nutrition Assessment    INTERVENTION:  Ensure Enlive po BID, each supplement provides 350 kcal and 20 grams of protein   Recommend liberalize diet to Regular if agreeable with MD  Offer snack q hs  Patient prefers all liquids be given at room temperature    NUTRITION DIAGNOSIS:   Increased nutrient needs related to cancer and cancer related treatments as evidenced by estimated needs.   GOAL:   Patient will meet greater than or equal to 90% of their needs   MONITOR:   PO intake, Labs, Weight trends  REASON FOR ASSESSMENT:   Malnutrition Screening Tool    ASSESSMENT: The patient is a very pleasant 66 yo,  caucasian male who presents with recurrence of colon cancer and lung metastasis. He has a port-a cath for his palliative chemotherapy and is in the process of receiving his second treatment.   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 He affirms good appetite and is eating regular meals 3 times daily plus hs snacks.  He drinks water, diet sundrop or sprite. He likes Ensure and says he tolerates his liquids better at room temperature.  His weight hx is stable currently between 91-93 kg.   Nutrition-Focused physical exam findings: no evidence of edema, muscle loss or fat depletion.     Recent Labs Lab 07/22/16 0856 07/23/16 0105  NA 136 136  K 3.8 3.7  CL 107 105  CO2 23 21*  BUN 11 13  CREATININE 0.83 0.90  CALCIUM 8.3* 9.1  GLUCOSE 298* 193*   Labs: glucose 193 mg/dl  Meds: Rocephin, Remeron (appetite), Protonix  Diet Order:  Diet heart healthy/carb modified Room service appropriate? Yes; Fluid consistency: Thin  Skin:  Reviewed, no issues  Last BM:  3/20   Height:   Ht Readings from Last 1 Encounters:  07/23/16 '6\' 2"'$  (1.88 m)    Weight:   Wt Readings from Last 1 Encounters:  07/23/16 200 lb 9.6 oz (91 kg)    Ideal Body Weight:  86 kg  BMI:  Body mass index is 25.76  kg/m.  Estimated Nutritional Needs:   Kcal:  1464-3142  Protein:  109-118 gr  Fluid:  2.3 liters daily  EDUCATION NEEDS:   No education needs identified at this time  Colman Cater MS,RD,CSG,LDN Office: #[phone] Pager: [phone]

## 2016-07-23 NOTE — ED Notes (Addendum)
Lab drawing blood cultures at this time.

## 2016-07-23 NOTE — ED Provider Notes (Signed)
[facility] DEPT Provider Note   CSN: [id number] Arrival date & time: 07/23/16  0024  Time seen 12:55 AM   History   Chief Complaint Chief Complaint  Patient presents with  . Fever    HPI ROLDAN LAFOREST is a 66 y.o. male.  HPI   Pt has a hx of colon cancer and states he started chemotherapy again on March 6 with fluorouracil and also received leucovorin and again on 3/20. He states last time he has no problems with his chemotherapy, And states the dose of one of his agents was not as high as the one he got yesterday. He states he started getting it at 8:30 in the morning. About 3:30 PM after he got home he started feeling weak. He then in the evening started having chills that lasted for about an hour. Then at 9 PM he felt hot and sweaty and his temperature was 102.6 area he did have nausea and vomited once. He has had a mild cough especially when he drinks hot water. He had some mild clear rhinorrhea tonight. He denies sore throat, sneezing, or diarrhea. He states he has some mild nausea. He states he did get the flu vaccine and pneumonia vaccine this year. Wife reports with his chemotherapy in 2012 he got dehydrated very easily and had to get IV fluids 3 times a week.  PCP [name], MD Oncologist Dr Talbert Cage  Past Medical History:  Diagnosis Date  . Chronic anticoagulation   . Colon cancer ([place]) 07/24/10   rectal ca, inv adenocarcinoma  . Depression 04/01/2011  . Diabetes mellitus without complication ([place])   . DVT (deep venous thrombosis) ([place]) 05/09/2011  . GERD (gastroesophageal reflux disease)   . High output ileostomy ([place]) 05/21/2011  . History of kidney stones   . HTN (hypertension)   . Hx of radiation therapy 09/02/10 to 10/14/10   pelvis  . Lung metastasis ([place])   . Neuropathy ([place])   . Peripheral vascular disease ([place])    dvt's,pe  . Pneumonia    hx x3  . Pulmonary embolism ([place])   . Rectal cancer ([place]) 01/15/2011   S/P radiation and concurrent 5-FU continuous  infusion from 09/09/10- 10/10/10.  S/P proctectomy with colorectal anastomosis and diverting loop ileostomy on 11/14/10 at [facility] by Dr. Harlon Ditty. Pathology reveals a pT3b N1 with 3/20 lymph nodes.      Patient Active Problem List   Diagnosis Date Noted  . PNA (pneumonia) 07/23/2016  . Abnormal pancreas function test   . Rectal pain 05/30/2016  . Loss of weight 05/30/2016  . Hiatal hernia   . Schatzki's ring   . GI bleed 11/23/2014  . Chronic anticoagulation 11/23/2014  . Constipation 11/23/2014  . Heme positive stool 11/23/2014  . Gastritis 11/23/2014  . S/P thoracotomy 11/23/2014  . History of rectal cancer 11/23/2014  . S/P partial lobectomy of lung 11/15/2014  . DVT (deep venous thrombosis) ([place])   . Lung nodule   . Lung nodule, solitary   . Acute pulmonary embolism ([place]) 09/08/2014  . Lactic acidosis 09/08/2014  . Lower abdominal pain 09/08/2014  . Urinary retention 09/08/2014  . Diarrhea 08/01/2014  . 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 Stiffness of joint, not elsewhere classified, ankle and foot 01/31/2014  . Weakness of both legs 01/31/2014  . Hx of radiation therapy   . Rectal cancer ([place]) 01/15/2011  . GERD 07/11/2010    Past Surgical History:  Procedure Laterality Date  .  COLON SURGERY  11/14/2010   proctectomy with colorectal anastomosis and diverting loop ileostomy (temporary planned)  . COLONOSCOPY  07/2010   proximal rectal apple core mass 10-14cm from anal verge (adenocarcinoma), 2-3cm distal rectal carpet polyp s/p piecemeal snare polypectomy (adenoma)  . COLONOSCOPY  04/21/2012   RMR: Friable,fibrotic appearing colorectal anastomosis producing some luminal narrowing-not felt to be critical. path: focal erosion with slight inflammation and hyperemia. SURVEILLANCE DUE DEC 2015  . COLONOSCOPY N/A 11/24/2013   Dr. Rourk:somewhat fibrotic/friable anastomotic mucosal-status post biopsy (narrowing not felt to be clinically  significant) Single colonic diverticulum. benign polypoid rectal mucosa  . colostomy reversal  april 2013  . ESOPHAGOGASTRODUODENOSCOPY  07/2010   RMR: schatki ring s/p dilation, small hh, SB bx benign  . ESOPHAGOGASTRODUODENOSCOPY N/A 09/04/2015   Procedure: ESOPHAGOGASTRODUODENOSCOPY (EGD);  Surgeon: Daneil Dolin, MD;  Location: [facility];  Service: Endoscopy;  Laterality: N/A;  730  . EUS  08/2010   Dr. Owens Loffler. uT3N0 circumferential, nearly obstruction rectosigmoid adenocarcinoma, distal edge 12cm from anal verge  . EUS N/A 06/19/2016   Procedure: UPPER ENDOSCOPIC ULTRASOUND (EUS) RADIAL;  Surgeon: Milus Banister, MD;  Location: [facility] ENDOSCOPY;  Service: Endoscopy;  Laterality: N/A;  . HERNIA REPAIR     abd hernia repair  . IVC filter    . ivc filter    . port a cath placement    . PORT-A-CATH REMOVAL  09/24/2011   Procedure: REMOVAL PORT-A-CATH;  Surgeon: Donato Heinz, MD;  Location: [facility] ORS;  Service: General;  Laterality: N/A;  Minor Room  . PORTACATH PLACEMENT Right 07/07/2016   Procedure: INSERTION PORT-A-CATH;  Surgeon: Aviva Signs, MD;  Location: [facility] ORS;  Service: General;  Laterality: Right;  Marland Kitchen VIDEO ASSISTED THORACOSCOPY (VATS)/WEDGE RESECTION Right 11/15/2014   Procedure: VIDEO ASSISTED THORACOSCOPY (VATS)/LUNG RESECTION WITH RIGHT LINGULECTOMY;  Surgeon: [name], MD;  Location: [facility] [place];  Service: Thoracic;  Laterality: Right;  Marland Kitchen VIDEO BRONCHOSCOPY N/A 11/15/2014   Procedure: VIDEO BRONCHOSCOPY;  Surgeon: [name], MD;  Location: [facility] OR;  Service: Thoracic;  Laterality: N/A;       Home Medications    Prior to Admission medications   Medication Sig Start Date End Date Taking? Authorizing Provider  clonazePAM (KLONOPIN) 0.5 MG tablet Take 0.5 mg by mouth 2 (two) times daily as needed for anxiety.    Historical Provider, MD  dexamethasone (DECADRON) 4 MG tablet Take 2 tablets (8 mg total) by mouth daily. Start the day after chemotherapy for 2 days. Take  with food. 07/02/16   Twana First, MD  Diphenhyd-Hydrocort-Nystatin (FIRST-DUKES MOUTHWASH) SUSP Use as directed 5 mLs in the mouth or throat 4 (four) times daily. 07/10/16   Twana First, MD  fluorouracil CALGB 09735 in sodium chloride 0.9 % 150 mL Inject into the vein over 96 hr.    Historical Provider, MD  gabapentin (NEURONTIN) 300 MG capsule Take 3 capsules (900 mg total) by mouth 2 (two) times daily. 07/16/16   Baird Cancer, PA-C  glimepiride (AMARYL) 2 MG tablet Take 2 mg by mouth 2 (two) times daily.     Historical Provider, MD  HYDROcodone-acetaminophen (NORCO/VICODIN) 5-325 MG tablet Take 1 tablet by mouth every 6 (six) hours as needed for moderate pain. 06/23/16   Baird Cancer, PA-C  leucovorin in dextrose 5 % 250 mL Inject into the vein once.    Historical Provider, MD  Lidocaine 2 % GEL Apply 1 application topically 4 (four) times daily as needed. 07/14/16  Baird Cancer, PA-C  lidocaine-prilocaine (EMLA) cream Apply to affected area once 07/02/16   Twana First, MD  LORazepam (ATIVAN) 0.5 MG tablet Take 1 tablet (0.5 mg total) by mouth every 6 (six) hours as needed (Nausea or vomiting). 07/02/16   Twana First, MD  mirtazapine (REMERON) 7.5 MG tablet Take 7.5 mg by mouth at bedtime.    Historical Provider, MD  omeprazole (PRILOSEC) 20 MG capsule Take 20 mg by mouth 2 (two) times daily before a meal.     Historical Provider, MD  ondansetron (ZOFRAN) 8 MG tablet Take 1 tablet (8 mg total) by mouth 2 (two) times daily as needed for refractory nausea / vomiting. Start on day 3 after chemotherapy. 07/02/16   Twana First, MD  OXALIPLATIN IV Inject into the vein.    Historical Provider, MD  prochlorperazine (COMPAZINE) 10 MG tablet Take 1 tablet (10 mg total) by mouth every 6 (six) hours as needed (Nausea or vomiting). 07/02/16   Twana First, MD  psyllium (METAMUCIL) 58.6 % powder Take 1 packet by mouth daily.     Historical Provider, MD  rivaroxaban (XARELTO) 20 MG TABS tablet Take 1 tablet  (20 mg total) by mouth daily with supper. 11/18/14   Erin R Barrett, PA-C  tamsulosin (FLOMAX) 0.4 MG CAPS capsule Take 0.4 mg by mouth at bedtime.     Historical Provider, MD  traZODone (DESYREL) 100 MG tablet Take 100 mg by mouth at bedtime.    Historical Provider, MD  vitamin B-12 (CYANOCOBALAMIN) 1000 MCG tablet Take 1,000 mcg by mouth daily.    Historical Provider, MD  vitamin E 400 UNIT capsule Take 400 Units by mouth daily.    Historical Provider, MD  zinc oxide 20 % ointment Apply 1 application topically as needed for irritation. 07/14/16   Baird Cancer, PA-C    Family History Family History  Problem Relation Age of Onset  . Cancer Brother     throat  . Cancer Brother     prostate  . Colon cancer Neg Hx   . Liver disease Neg Hx   . Inflammatory bowel disease Neg Hx     Social History Social History  Substance Use Topics  . Smoking status: Never Smoker  . Smokeless tobacco: Never Used  . Alcohol use No  lives at home Lives with spouse   Allergies   Oxycodone; Tramadol; and Trazodone and nefazodone   Review of Systems Review of Systems  All other systems reviewed and are negative.    Physical Exam Updated Vital Signs BP 118/75 (BP Location: Left Arm)   Pulse (!) 117   Temp (!) 101.4 F (38.6 C) (Oral)   Resp 18   Ht '6\' 2"'$  (1.88 m)   Wt 206 lb (93.4 kg)   SpO2 95%   BMI 26.45 kg/m   Vital signs normal except tachycardia and fever   Physical Exam  Constitutional: He is oriented to person, place, and time. He appears well-developed and well-nourished.  Non-toxic appearance. He does not appear ill. No distress.  HENT:  Head: Normocephalic and atraumatic.  Right Ear: External ear normal.  Left Ear: External ear normal.  Nose: Nose normal. No mucosal edema or rhinorrhea.  Mouth/Throat: Mucous membranes are dry. No dental abscesses or uvula swelling.  Eyes: Conjunctivae and EOM are normal. Pupils are equal, round, and reactive to light.  Neck: Normal  range of motion and full passive range of motion without pain. Neck supple.  Cardiovascular: Normal rate, regular rhythm  and normal heart sounds.  Exam reveals no gallop and no friction rub.   No murmur heard. Pulmonary/Chest: Effort normal and breath sounds normal. No respiratory distress. He has no wheezes. He has no rhonchi. He has no rales. He exhibits no tenderness and no crepitus.  Abdominal: Soft. Normal appearance and bowel sounds are normal. He exhibits no distension. There is no tenderness. There is no rebound and no guarding.  Musculoskeletal: Normal range of motion. He exhibits no edema or tenderness.  Moves all extremities well.   Neurological: He is alert and oriented to person, place, and time. He has normal strength. No cranial nerve deficit.  Skin: Skin is warm, dry and intact. No rash noted. No erythema. No pallor.  Psychiatric: He has a normal mood and affect. His speech is normal and behavior is normal. His mood appears not anxious.  Nursing note and vitals reviewed.    ED Treatments / Results  Labs (all labs ordered are listed, but only abnormal results are displayed) Results for orders placed or performed during the hospital encounter of 07/23/16  Blood Culture (routine x 2)  Result Value Ref Range   Specimen Description BLOOD LEFT ARM    Special Requests BOTTLES DRAWN AEROBIC AND ANAEROBIC [place]    Culture PENDING    Report Status PENDING   Blood Culture (routine x 2)  Result Value Ref Range   Specimen Description BLOOD LEFT HAND    Special Requests BOTTLES DRAWN AEROBIC AND ANAEROBIC 6CC    Culture PENDING    Report Status PENDING   Comprehensive metabolic panel  Result Value Ref Range   Sodium 136 135 - 145 mmol/L   Potassium 3.7 3.5 - 5.1 mmol/L   Chloride 105 101 - 111 mmol/L   CO2 21 (L) 22 - 32 mmol/L   Glucose, Bld 193 (H) 65 - 99 mg/dL   BUN 13 6 - 20 mg/dL   Creatinine, Ser 0.90 0.61 - 1.24 mg/dL   Calcium 9.1 8.9 - 10.3 mg/dL   Total Protein 6.7  6.5 - 8.1 g/dL   Albumin 3.7 3.5 - 5.0 g/dL   AST 27 15 - 41 U/L   ALT 28 17 - 63 U/L   Alkaline Phosphatase 75 38 - 126 U/L   Total Bilirubin 1.4 (H) 0.3 - 1.2 mg/dL   GFR calc non Af Amer >60 >60 mL/min   GFR calc Af Amer >60 >60 mL/min   Anion gap 10 5 - 15  CBC WITH DIFFERENTIAL  Result Value Ref Range   WBC 8.0 4.0 - 10.5 K/uL   RBC 4.51 4.22 - 5.81 MIL/uL   Hemoglobin 13.4 13.0 - 17.0 g/dL   HCT 38.4 (L) 39.0 - 52.0 %   MCV 85.1 78.0 - 100.0 fL   MCH 29.7 26.0 - 34.0 pg   MCHC 34.9 30.0 - 36.0 g/dL   RDW 13.6 11.5 - 15.5 %   Platelets 149 (L) 150 - 400 K/uL   Neutrophils Relative % 92 %   Neutro Abs 7.4 1.7 - 7.7 K/uL   Lymphocytes Relative 2 %   Lymphs Abs 0.2 (L) 0.7 - 4.0 K/uL   Monocytes Relative 6 %   Monocytes Absolute 0.5 0.1 - 1.0 K/uL   Eosinophils Relative 0 %   Eosinophils Absolute 0.0 0.0 - 0.7 K/uL   Basophils Relative 0 %   Basophils Absolute 0.0 0.0 - 0.1 K/uL  Urinalysis, Routine w reflex microscopic  Result Value Ref Range   Color, Urine YELLOW  YELLOW   APPearance CLEAR CLEAR   Specific Gravity, Urine 1.025 1.005 - 1.030   pH 5.5 5.0 - 8.0   Glucose, UA NEGATIVE NEGATIVE mg/dL   Hgb urine dipstick NEGATIVE NEGATIVE   Bilirubin Urine NEGATIVE NEGATIVE   Ketones, ur NEGATIVE NEGATIVE mg/dL   Protein, ur NEGATIVE NEGATIVE mg/dL   Nitrite NEGATIVE NEGATIVE   Leukocytes, UA NEGATIVE NEGATIVE  I-Stat CG4 Lactic Acid, ED  (not at  [facility])  Result Value Ref Range   Lactic Acid, Venous 3.19 (HH) 0.5 - 1.9 mmol/L   Comment NOTIFIED PHYSICIAN     Laboratory interpretation all normal except + LA, not neutropenic    EKG  EKG Interpretation  Date/Time:  Wednesday July 23 2016 00:45:21 EDT Ventricular Rate:  116 PR Interval:    QRS Duration: 94 QT Interval:  312 QTC Calculation: 434 R Axis:   55 Text Interpretation:  Sinus tachycardia Low voltage, precordial leads Since last tracing rate faster 02 Jul 2016 Confirmed by Codie Krogh  MD-I, Baird Polinski ([id number]) on  07/23/2016 1:29:41 AM       Radiology Dg Chest Port 1 View  Result Date: 07/23/2016 CLINICAL DATA:  Acute onset of fever and generalized weakness. Recent chemotherapy. Vomiting. Initial encounter. EXAM: PORTABLE CHEST 1 VIEW COMPARISON:  Chest radiograph performed 07/07/2016 FINDINGS: Mild right basilar airspace opacity raises concern for pneumonia. No pleural effusion or pneumothorax is seen. The cardiomediastinal silhouette is normal in size. No acute osseous abnormalities are identified. IMPRESSION: Mild right basilar airspace opacity raises concern for pneumonia. Electronically Signed   By: Garald Balding M.D.   On: 07/23/2016 02:14      Nm Pet Image Initial (pi) Skull Base To Thigh  Result Date: 06/25/2016 CLINICAL DATA:  Initial treatment strategy for pancreatic lesion. History of colon cancer with solitary pulmonary metastasis, status post resection. . IMPRESSION: 1. Two pleural-based nodules in the right hemithorax are hypermetabolic. Metastatic disease is a distinct consideration. The scattered pulmonary parenchymal nodule seen on previous diagnostic CT imaging are below the threshold for reliable resolution on PET imaging. 2. Hypermetabolic lesion pancreatic head, consistent with neoplasm. As noted on prior CT, adenocarcinoma is any consideration. No evidence for hypermetabolic abdominal lymphadenopathy. Mottled uptake noted in the liver, but no discrete hepatic metastases are evident on PET imaging. 3. Appendix remains distended up to 09-10 mm diameter with a stone towards the tip in subtle periappendiceal edema/inflammation. The appendix is hypermetabolic along its length. Imaging features are relatively stable in the 12 day interval since prior CT scan. While appendicitis is a consideration, the relative stability over 12 days would be unusual for that etiology. Continued close follow-up recommended. Electronically Signed   By: Misty Stanley M.D.   On: 06/25/2016 13:39   Dg Chest Port 1  View  Result Date: 07/07/2016 CLINICAL DATA:  Status post chest wall port placement . IMPRESSION: Changes of known metastatic disease as well as new port placement. No pneumothorax is noted. Electronically Signed   By: Inez Catalina M.D.   On: 07/07/2016 09:22    Procedures Procedures (including critical care time)  Medications Ordered in ED Medications  sodium chloride 0.9 % bolus 1,000 mL (1,000 mLs Intravenous New Bag/Given 07/23/16 0141)    And  sodium chloride 0.9 % bolus 1,000 mL (not administered)    And  sodium chloride 0.9 % bolus 1,000 mL (not administered)  acetaminophen (TYLENOL) 325 MG tablet (not administered)  ceFEPIme (MAXIPIME) 2 g in dextrose 5 % 50 mL IVPB (0 g  Intravenous Stopped 07/23/16 0228)  acetaminophen (TYLENOL) tablet 650 mg (650 mg Oral Given 07/23/16 0240)     Initial Impression / Assessment and Plan / ED Course  I have reviewed the triage vital signs and the nursing notes.  Pertinent labs & imaging results that were available during my care of the patient were reviewed by me and considered in my medical decision making (see chart for details).  Patient started on neutropenic antibiotics while waiting for his laboratory test results. He was started on the sepsis protocol without calling Code Sepsis. He was given IV fluids for his dehydration. He was placed in respiratory isolation to protect him in case he is neutropenic.   2:40 AM I discussed patient's test results with the patient. He is agreeable for admission. He states he's feeling better. His tongue is mildly moist but still is dry. Patient states he's had pneumonia at least 3 times before.  2:48 AM Dr. Shanon Brow, hospitalist will admit  Final Clinical Impressions(s) / ED Diagnoses   Final diagnoses:  Community acquired pneumonia of right lower lobe of lung [facility])    Plan admission  Rolland Porter, MD, Barbette Or, MD 07/23/16 [phone]

## 2016-07-23 NOTE — ED Triage Notes (Signed)
Pt c/o feeling weak and having a fever of 102.6; pt states he had a chemo treatment today; pt states he has had one episode of vomiting;pt states he took tylenol 1 hour ago

## 2016-07-23 NOTE — H&P (Signed)
History and Physical    Charles [name] NUU:[id number] DOB: [birth year] DOA: 07/23/2016  PCP: [name], MD  Patient coming from: home  Chief Complaint:  fever  HPI: Charles [name] is a 66 y.o. male with medical history significant of colon cancer on chemo currently getting iv infusion went home and felt chills and fever.  Came to ED.  He has been coughing for one day.  No urinary symptoms.  No rashes.  Found to have pna.  Has had no recent abx or hospitalizations.  Pt referred for admission for pna with lactate over 3.   Review of Systems: As per HPI otherwise 10 point review of systems negative.   Past Medical History:  Diagnosis Date  . Chronic anticoagulation   . Colon cancer ([place]) 07/24/2010   rectal ca, inv adenocarcinoma  . Depression 04/01/2011  . Diabetes mellitus without complication ([place])   . DVT (deep venous thrombosis) ([place]) 05/09/2011  . GERD (gastroesophageal reflux disease)   . High output ileostomy ([place]) 05/21/2011  . History of kidney stones   . HTN (hypertension)   . Hx of radiation therapy 09/02/10 to 10/14/10   pelvis  . Lung metastasis ([place])   . Neuropathy ([place])   . Peripheral vascular disease ([place])    dvt's,pe  . Pneumonia    hx x3  . Pulmonary embolism ([place])   . Rectal cancer ([place]) 01/15/2011   S/P radiation and concurrent 5-FU continuous infusion from 09/09/10- 10/10/10.  S/P proctectomy with colorectal anastomosis and diverting loop ileostomy on 11/14/10 at [facility] by Dr. Harlon Ditty. Pathology reveals a pT3b N1 with 3/20 lymph nodes.      Past Surgical History:  Procedure Laterality Date  . COLON SURGERY  11/14/2010   proctectomy with colorectal anastomosis and diverting loop ileostomy (temporary planned)  . COLONOSCOPY  07/2010   proximal rectal apple core mass 10-14cm from anal verge (adenocarcinoma), 2-3cm distal rectal carpet polyp s/p piecemeal snare polypectomy (adenoma)  . COLONOSCOPY  04/21/2012   RMR: Friable,fibrotic appearing  colorectal anastomosis producing some luminal narrowing-not felt to be critical. path: focal erosion with slight inflammation and hyperemia. SURVEILLANCE DUE DEC 2015  . COLONOSCOPY N/A 11/24/2013   Dr. Rourk:somewhat fibrotic/friable anastomotic mucosal-status post biopsy (narrowing not felt to be clinically significant) Single colonic diverticulum. benign polypoid rectal mucosa  . colostomy reversal  april 2013  . ESOPHAGOGASTRODUODENOSCOPY  07/2010   RMR: schatki ring s/p dilation, small hh, SB bx benign  . ESOPHAGOGASTRODUODENOSCOPY N/A 09/04/2015   Procedure: ESOPHAGOGASTRODUODENOSCOPY (EGD);  Surgeon: Daneil Dolin, MD;  [facility];  Service: Endoscopy;  Laterality: N/A;  730  . EUS  08/2010   Dr. Owens Loffler. uT3N0 circumferential, nearly obstruction rectosigmoid adenocarcinoma, distal edge 12cm from anal verge  . EUS N/A 06/19/2016   Procedure: UPPER ENDOSCOPIC ULTRASOUND (EUS) RADIAL;  Surgeon: Milus Banister, MD;  Location: [facility];  Service: Endoscopy;  Laterality: N/A;  . HERNIA REPAIR     abd hernia repair  . IVC filter    . ivc filter    . port a cath placement    . PORT-A-CATH REMOVAL  09/24/2011   Procedure: REMOVAL PORT-A-CATH;  Surgeon: Donato Heinz, MD;  Location: [facility] ORS;  Service: General;  Laterality: N/A;  Minor Room  . PORTACATH PLACEMENT Right 07/07/2016   Procedure: INSERTION PORT-A-CATH;  Surgeon: Aviva Signs, MD;  Location: [facility] ORS;  Service: General;  Laterality: Right;  Marland Kitchen VIDEO ASSISTED THORACOSCOPY (VATS)/WEDGE RESECTION Right 11/15/2014  Procedure: VIDEO ASSISTED THORACOSCOPY (VATS)/LUNG RESECTION WITH RIGHT LINGULECTOMY;  Surgeon: [name], MD;  Location: [place];  Service: Thoracic;  Laterality: Right;  Marland Kitchen VIDEO BRONCHOSCOPY N/A 11/15/2014   Procedure: VIDEO BRONCHOSCOPY;  Surgeon: [name], MD;  Location: [place];  Service: Thoracic;  Laterality: N/A;     reports that he has never smoked. He has never used smokeless tobacco. He  reports that he does not drink alcohol or use drugs.  Allergies  Allergen Reactions  . Oxycodone     Blisters, hallucinations  . Tramadol     Blisters, hallucinations  . Trazodone And Nefazodone Other (See Comments)    hallucinations    Family History  Problem Relation Age of Onset  . Cancer Brother     throat  . Cancer Brother     prostate  . Colon cancer Neg Hx   . Liver disease Neg Hx   . Inflammatory bowel disease Neg Hx     Prior to Admission medications   Medication Sig Start Date End Date Taking? Authorizing Provider  clonazePAM (KLONOPIN) 0.5 MG tablet Take 0.5 mg by mouth 2 (two) times daily as needed for anxiety.    Historical Provider, MD  dexamethasone (DECADRON) 4 MG tablet Take 2 tablets (8 mg total) by mouth daily. Start the day after chemotherapy for 2 days. Take with food. 07/02/16   Twana First, MD  Diphenhyd-Hydrocort-Nystatin (FIRST-DUKES MOUTHWASH) SUSP Use as directed 5 mLs in the mouth or throat 4 (four) times daily. 07/10/16   Twana First, MD  fluorouracil CALGB 80998 in sodium chloride 0.9 % 150 mL Inject into the vein over 96 hr.    Historical Provider, MD  gabapentin (NEURONTIN) 300 MG capsule Take 3 capsules (900 mg total) by mouth 2 (two) times daily. 07/16/16   Baird Cancer, PA-C  glimepiride (AMARYL) 2 MG tablet Take 2 mg by mouth 2 (two) times daily.     Historical Provider, MD  HYDROcodone-acetaminophen (NORCO/VICODIN) 5-325 MG tablet Take 1 tablet by mouth every 6 (six) hours as needed for moderate pain. 06/23/16   Baird Cancer, PA-C  leucovorin in dextrose 5 % 250 mL Inject into the vein once.    Historical Provider, MD  Lidocaine 2 % GEL Apply 1 application topically 4 (four) times daily as needed. 07/14/16   Baird Cancer, PA-C  lidocaine-prilocaine (EMLA) cream Apply to affected area once 07/02/16   Twana First, MD  LORazepam (ATIVAN) 0.5 MG tablet Take 1 tablet (0.5 mg total) by mouth every 6 (six) hours as needed (Nausea or vomiting).  07/02/16   Twana First, MD  mirtazapine (REMERON) 7.5 MG tablet Take 7.5 mg by mouth at bedtime.    Historical Provider, MD  omeprazole (PRILOSEC) 20 MG capsule Take 20 mg by mouth 2 (two) times daily before a meal.     Historical Provider, MD  ondansetron (ZOFRAN) 8 MG tablet Take 1 tablet (8 mg total) by mouth 2 (two) times daily as needed for refractory nausea / vomiting. Start on day 3 after chemotherapy. 07/02/16   Twana First, MD  OXALIPLATIN IV Inject into the vein.    Historical Provider, MD  prochlorperazine (COMPAZINE) 10 MG tablet Take 1 tablet (10 mg total) by mouth every 6 (six) hours as needed (Nausea or vomiting). 07/02/16   Twana First, MD  psyllium (METAMUCIL) 58.6 % powder Take 1 packet by mouth daily.     Historical Provider, MD  rivaroxaban (XARELTO) 20 MG TABS tablet Take  1 tablet (20 mg total) by mouth daily with supper. 11/18/14   Erin R Barrett, PA-C  tamsulosin (FLOMAX) 0.4 MG CAPS capsule Take 0.4 mg by mouth at bedtime.     Historical Provider, MD  traZODone (DESYREL) 100 MG tablet Take 100 mg by mouth at bedtime.    Historical Provider, MD  vitamin B-12 (CYANOCOBALAMIN) 1000 MCG tablet Take 1,000 mcg by mouth daily.    Historical Provider, MD  vitamin E 400 UNIT capsule Take 400 Units by mouth daily.    Historical Provider, MD  zinc oxide 20 % ointment Apply 1 application topically as needed for irritation. 07/14/16   Baird Cancer, PA-C    Physical Exam: Vitals:   07/23/16 0100 07/23/16 0130 07/23/16 0234 07/23/16 0235  BP: 117/80 114/68 130/70   Pulse: (!) 109 (!) 110 98   Resp: (!) '23 18 18   '$ Temp:    100.3 F (37.9 C)  TempSrc:      SpO2: 94% 94% 97%   Weight:      Height:        Constitutional: NAD, calm, comfortable Vitals:   07/23/16 0100 07/23/16 0130 07/23/16 0234 07/23/16 0235  BP: 117/80 114/68 130/70   Pulse: (!) 109 (!) 110 98   Resp: (!) '23 18 18   '$ Temp:    100.3 F (37.9 C)  TempSrc:      SpO2: 94% 94% 97%   Weight:      Height:        Eyes: PERRL, lids and conjunctivae normal ENMT: Mucous membranes are moist. Posterior pharynx clear of any exudate or lesions.Normal dentition.  Neck: normal, supple, no masses, no thyromegaly Respiratory: clear to auscultation bilaterally, no wheezing, no crackles. Normal respiratory effort. No accessory muscle use.  Cardiovascular: Regular rate and rhythm, no murmurs / rubs / gallops. No extremity edema. 2+ pedal pulses. No carotid bruits.  Abdomen: no tenderness, no masses palpated. No hepatosplenomegaly. Bowel sounds positive.  Musculoskeletal: no clubbing / cyanosis. No joint deformity upper and lower extremities. Good ROM, no contractures. Normal muscle tone.  Skin: no rashes, lesions, ulcers. No induration Neurologic: CN 2-12 grossly intact. Sensation intact, DTR normal. Strength 5/5 in all 4.  Psychiatric: Normal judgment and insight. Alert and oriented x 3. Normal mood.    Labs on Admission: I have personally reviewed following labs and imaging studies  CBC:  Recent Labs Lab 07/22/16 0856 07/23/16 0105  WBC 4.6 8.0  NEUTROABS 2.6 7.4  HGB 12.2* 13.4  HCT 35.6* 38.4*  MCV 87.3 85.1  PLT 157 400*   Basic Metabolic Panel:  Recent Labs Lab 07/22/16 0856 07/23/16 0105  NA 136 136  K 3.8 3.7  CL 107 105  CO2 23 21*  GLUCOSE 298* 193*  BUN 11 13  CREATININE 0.83 0.90  CALCIUM 8.3* 9.1   GFR: Estimated Creatinine Clearance: 95.1 mL/min (by C-G formula based on SCr of 0.9 mg/dL). Liver Function Tests:  Recent Labs Lab 07/22/16 0856 07/23/16 0105  AST 23 27  ALT 25 28  ALKPHOS 84 75  BILITOT 1.0 1.4*  PROT 5.7* 6.7  ALBUMIN 3.3* 3.7   Urine analysis:    Component Value Date/Time   COLORURINE YELLOW 07/23/2016 0108   APPEARANCEUR CLEAR 07/23/2016 0108   LABSPEC 1.025 07/23/2016 0108   PHURINE 5.5 07/23/2016 0108   GLUCOSEU NEGATIVE 07/23/2016 0108   HGBUR NEGATIVE 07/23/2016 0108   BILIRUBINUR NEGATIVE 07/23/2016 [place] NEGATIVE  07/23/2016 0108   PROTEINUR  NEGATIVE 07/23/2016 0108   UROBILINOGEN 1.0 11/14/2014 1504   NITRITE NEGATIVE 07/23/2016 0108   LEUKOCYTESUR NEGATIVE 07/23/2016 0108    Recent Results (from the past 240 hour(s))  Blood Culture (routine x 2)     Status: None (Preliminary result)   Collection Time: 07/23/16  1:43 AM  Result Value Ref Range Status   Specimen Description BLOOD LEFT ARM  Final   Special Requests BOTTLES DRAWN AEROBIC AND ANAEROBIC [place]  Final   Culture PENDING  Incomplete   Report Status PENDING  Incomplete  Blood Culture (routine x 2)     Status: None (Preliminary result)   Collection Time: 07/23/16  1:47 AM  Result Value Ref Range Status   Specimen Description BLOOD LEFT HAND  Final   Special Requests BOTTLES DRAWN AEROBIC AND ANAEROBIC 6CC  Final   Culture PENDING  Incomplete   Report Status PENDING  Incomplete     Radiological Exams on Admission: Dg Chest Port 1 View  Result Date: 07/23/2016 CLINICAL DATA:  Acute onset of fever and generalized weakness. Recent chemotherapy. Vomiting. Initial encounter. EXAM: PORTABLE CHEST 1 VIEW COMPARISON:  Chest radiograph performed 07/07/2016 FINDINGS: Mild right basilar airspace opacity raises concern for pneumonia. No pleural effusion or pneumothorax is seen. The cardiomediastinal silhouette is normal in size. No acute osseous abnormalities are identified. IMPRESSION: Mild right basilar airspace opacity raises concern for pneumonia. Electronically Signed   By: Garald Balding M.D.   On: 07/23/2016 02:14     Assessment/Plan 66 yo male chemo patient with CAP  Principal Problem:   PNA (pneumonia)- given cefepime in ED due to worry for neutropenia before labs were resulted, pt is not neutropenic.  Give rocephin and azithro.  Follow cultures.  ivf.  Active Problems:   Lactic acidosis- appears chronically elevated, given 30cc/kg bolus in ED.  Repeat level q 3 hours.   Colon cancer ([place])- noted, cont infusion    Med rec  pending  DVT prophylaxis: scds  Code Status:  full Family Communication:  none Disposition Plan:  Per day team Consults called:  none Admission status:  observation   Jorryn Casagrande A MD [facility]  If 7PM-7AM, please contact night-coverage [email or web address] Password [facility]  07/23/2016, 2:59 AM

## 2016-07-24 ENCOUNTER — Observation Stay (HOSPITAL_BASED_OUTPATIENT_CLINIC_OR_DEPARTMENT_OTHER): Payer: Medicare Other

## 2016-07-24 DIAGNOSIS — C189 Malignant neoplasm of colon, unspecified: Secondary | ICD-10-CM | POA: Diagnosis not present

## 2016-07-24 DIAGNOSIS — C20 Malignant neoplasm of rectum: Secondary | ICD-10-CM | POA: Diagnosis not present

## 2016-07-24 DIAGNOSIS — J181 Lobar pneumonia, unspecified organism: Secondary | ICD-10-CM | POA: Diagnosis not present

## 2016-07-24 DIAGNOSIS — Z452 Encounter for adjustment and management of vascular access device: Secondary | ICD-10-CM | POA: Diagnosis not present

## 2016-07-24 DIAGNOSIS — E872 Acidosis: Secondary | ICD-10-CM | POA: Diagnosis not present

## 2016-07-24 LAB — LACTIC ACID, PLASMA: Lactic Acid, Venous: 1.1 mmol/L (ref 0.5–1.9)

## 2016-07-24 LAB — URINE CULTURE: Culture: NO GROWTH

## 2016-07-24 LAB — BASIC METABOLIC PANEL
Anion gap: 8 (ref 5–15)
BUN: 13 mg/dL (ref 6–20)
CO2: 22 mmol/L (ref 22–32)
Calcium: 8.2 mg/dL — ABNORMAL LOW (ref 8.9–10.3)
Chloride: 100 mmol/L — ABNORMAL LOW (ref 101–111)
Creatinine, Ser: 0.7 mg/dL (ref 0.61–1.24)
GFR calc Af Amer: >60 mL/min (ref >60)
GFR calc non Af Amer: >60 mL/min (ref >60)
Glucose, Bld: 227 mg/dL — ABNORMAL HIGH (ref 65–99)
Potassium: 3.9 mmol/L (ref 3.5–5.1)
Sodium: 130 mmol/L — ABNORMAL LOW (ref 135–145)

## 2016-07-24 LAB — GLUCOSE, CAPILLARY
Glucose-Capillary: 184 mg/dL — ABNORMAL HIGH (ref 65–99)
Glucose-Capillary: 226 mg/dL — ABNORMAL HIGH (ref 65–99)

## 2016-07-24 MED ORDER — HEPARIN SOD (PORK) LOCK FLUSH 100 UNIT/ML IV SOLN
500.0000 [IU] | Freq: Once | INTRAVENOUS | Status: AC | PRN
  Administered 2016-07-24: 500 [IU]

## 2016-07-24 MED ORDER — LEVOFLOXACIN 750 MG PO TABS: 750.0000 mg | ORAL_TABLET | Freq: Every day | ORAL | 0 refills | Status: DC

## 2016-07-24 MED ORDER — SODIUM CHLORIDE 0.9% FLUSH
10.0000 mL | INTRAVENOUS | Status: DC | PRN
  Administered 2016-07-24: 10 mL
  Filled 2016-07-24: qty 10

## 2016-07-24 NOTE — Progress Notes (Signed)
Home infusion chemotherapy pump complete without difficulty.  Pump removed and port flushed per protocol.  Port needle removed intact and no active bleeding to the site.  VSS.  Patient still admitted to Dept. 300 inpatient during pump removal.

## 2016-07-24 NOTE — Patient Instructions (Signed)
[place] at [facility] Discharge Instructions  RECOMMENDATIONS MADE BY THE CONSULTANT AND ANY TEST RESULTS WILL BE SENT TO YOUR REFERRING PHYSICIAN.  Pump removal and port flush today.    Thank you for choosing [place] at [facility] to provide your oncology and hematology care.  To afford each patient quality time with our provider, please arrive at least 15 minutes before your scheduled appointment time.    If you have a lab appointment with the [facility] please come in thru the  Main Entrance and check in at the main information desk  You need to re-schedule your appointment should you arrive 10 or more minutes late.  We strive to give you quality time with our providers, and arriving late affects you and other patients whose appointments are after yours.  Also, if you no show three or more times for appointments you may be dismissed from the clinic at the providers discretion.     Again, thank you for choosing [facility].  Our hope is that these requests will decrease the amount of time that you wait before being seen by our physicians.       _____________________________________________________________  Should you have questions after your visit to [facility], please contact our office at [phone] between the hours of 8:30 a.m. and 4:30 p.m.  Voicemails left after 4:30 p.m. will not be returned until the following business day.  For prescription refill requests, have your pharmacy contact our office.       Resources For Cancer Patients and their Caregivers ? American Cancer Society: Can assist with transportation, wigs, general needs, runs Look Good Feel Better.        873-467-6788 ? Cancer Care: Provides financial assistance, online support groups, medication/co-pay assistance.  1-800-813-HOPE 805-515-9445) ? [facility] Assists [place] cancer patients and their  families through emotional , educational and financial support.  505-186-1058 ? [place] DSS Where to apply for food stamps, Medicaid and utility assistance. 343-234-2747 ? [facility]: Transportation to medical appointments. 986-014-1740 ? Social Security Administration: May apply for disability if have a Stage IV cancer. 360-496-2233 603-128-5101 ? [employer], Disability and Transit Services: Assists with nutrition, care and transit needs. [place] Support Programs: '@10RELATIVEDAYS'$ @ > Cancer Support Group  2nd Tuesday of the month 1pm-2pm, Journey Room  > Creative Journey  3rd Tuesday of the month 1130am-1pm, Journey Room  > Look Good Feel Better  1st Wednesday of the month 10am-12 noon, Journey Room (Call [place] to register [phone])

## 2016-07-24 NOTE — Discharge Summary (Signed)
Physician Discharge Summary  Charles [name] VFI:[id number] DOB: 1950-05-25 DOA: 07/23/2016  PCP: [name], MD  Admit date: 07/23/2016 Discharge date: 07/24/2016  Admitted From: home Disposition:  home  Recommendations for Outpatient Follow-up:  1. Follow up with PCP in 1-2 weeks 2. Please obtain BMP/CBC in one week   Home Health: Equipment/Devices:  Discharge Condition: stable CODE STATUS: full code Diet recommendation: Heart Healthy   Brief/Interim Summary: This is a 66 year old male with a history of colon cancer on chemotherapy, was brought to the hospital with complaints of fevers and chills. He reports having been coughing for one day prior to admission. He was noted to be febrile in the ER and had a lactic acid over 3. Chest x-ray indicates possible developing pneumonia. He was started on IV fluids, intravenous antibiotics. He was monitored in the hospital lactic acidosis resolved with hydration. He has not had any significant recurrence of fevers. The patient is feeling quite well. He does not have any shortness of breath or cough at this time. Blood cultures did not show any significant growth. He'll be transitioned to a course of oral Levaquin to complete antibiotics. Patient is otherwise feeling well and is requesting discharge home. He will follow-up with oncology for Further Management of His Colon Cancer.  Discharge Diagnoses:  Principal Problem:   Community acquired pneumonia of right lower lobe of lung ([place]) Active Problems:   Lactic acidosis   Colon cancer [facility])    Discharge Instructions  Discharge Instructions    Diet - low sodium heart healthy    Complete by:  As directed    Increase activity slowly    Complete by:  As directed      Allergies as of 07/24/2016      Reactions   Oxycodone    Blisters, hallucinations   Tramadol    Blisters, hallucinations   Trazodone And Nefazodone Other (See Comments)   hallucinations      Medication List     TAKE these medications   clonazePAM 0.5 MG tablet Commonly known as:  KLONOPIN Take 0.5 mg by mouth 2 (two) times daily as needed for anxiety.   dexamethasone 4 MG tablet Commonly known as:  DECADRON Take 2 tablets (8 mg total) by mouth daily. Start the day after chemotherapy for 2 days. Take with food.   FIRST-DUKES MOUTHWASH Susp Use as directed 5 mLs in the mouth or throat 4 (four) times daily.   fluorouracil CALGB 41660 in sodium chloride 0.9 % 150 mL Inject into the vein over 96 hr.   gabapentin 300 MG capsule Commonly known as:  NEURONTIN Take 3 capsules (900 mg total) by mouth 2 (two) times daily.   glimepiride 2 MG tablet Commonly known as:  AMARYL Take 2 mg by mouth 2 (two) times daily.   HYDROcodone-acetaminophen 5-325 MG tablet Commonly known as:  NORCO/VICODIN Take 1 tablet by mouth every 6 (six) hours as needed for moderate pain.   leucovorin in dextrose 5 % 250 mL Inject into the vein once.   levofloxacin 750 MG tablet Commonly known as:  LEVAQUIN Take 1 tablet (750 mg total) by mouth daily.   Lidocaine 2 % Gel Apply 1 application topically 4 (four) times daily as needed.   lidocaine-prilocaine cream Commonly known as:  EMLA Apply to affected area once   LORazepam 0.5 MG tablet Commonly known as:  ATIVAN Take 1 tablet (0.5 mg total) by mouth every 6 (six) hours as needed (Nausea or vomiting).   mirtazapine  7.5 MG tablet Commonly known as:  REMERON Take 7.5 mg by mouth at bedtime.   omeprazole 20 MG capsule Commonly known as:  PRILOSEC Take 20 mg by mouth 2 (two) times daily before a meal.   ondansetron 8 MG tablet Commonly known as:  ZOFRAN Take 1 tablet (8 mg total) by mouth 2 (two) times daily as needed for refractory nausea / vomiting. Start on day 3 after chemotherapy.   OXALIPLATIN IV Inject into the vein.   prochlorperazine 10 MG tablet Commonly known as:  COMPAZINE Take 1 tablet (10 mg total) by mouth every 6 (six) hours as needed  (Nausea or vomiting).   psyllium 58.6 % powder Commonly known as:  METAMUCIL Take 1 packet by mouth daily.   rivaroxaban 20 MG Tabs tablet Commonly known as:  XARELTO Take 1 tablet (20 mg total) by mouth daily with supper.   tamsulosin 0.4 MG Caps capsule Commonly known as:  FLOMAX Take 0.4 mg by mouth at bedtime.   traZODone 100 MG tablet Commonly known as:  DESYREL Take 100 mg by mouth at bedtime.   zinc oxide 20 % ointment Apply 1 application topically as needed for irritation.       Allergies  Allergen Reactions  . Oxycodone     Blisters, hallucinations  . Tramadol     Blisters, hallucinations  . Trazodone And Nefazodone Other (See Comments)    hallucinations    Consultations:     Procedures/Studies: Nm Pet Image Initial (pi) Skull Base To Thigh  Result Date: 06/25/2016 CLINICAL DATA:  Initial treatment strategy for pancreatic lesion. History of colon cancer with solitary pulmonary metastasis, status post resection. EXAM: NUCLEAR MEDICINE PET SKULL BASE TO THIGH TECHNIQUE: 12.2 mCi F-18 FDG was injected intravenously. Full-ring PET imaging was performed from the skull base to thigh after the radiotracer. CT data was obtained and used for attenuation correction and anatomic localization. FASTING BLOOD GLUCOSE:  Value: 116 mg/dl COMPARISON:  Chest abdomen pelvis CT 06/13/2016 FINDINGS: NECK No hypermetabolic lymph nodes in the neck. CHEST Posterior right costophrenic sulcus pleural mass identified on previous CT is hypermetabolic with SUV max = 3.9. In the right anterior paramidline hemithorax, likely anterior costophrenic sulcus, a 3.2 x 1.5 cm soft tissue lesion is hypermetabolic with SUV max = 4.3 (see image 132 series 3 of today's exam). Multiple right pulmonary nodules were described on the previous diagnostic CT. These are seen on CT images today for attenuation correction, but are below threshold size for resolution on PET imaging. ABDOMEN/PELVIS The soft tissue  fullness seen on previous CT in the region of the pancreatic head/ uncinate process is hypermetabolic with SUV max = 6.5. Mottled FDG accumulation is identified in the liver parenchyma without a discrete underlying focal liver lesion evident. No hypermetabolic lymphadenopathy in the abdomen. IVC filter again noted. Appendicolith identified towards the tip of the appendix with subtle periappendiceal edema/inflammation, as noted on the prior diagnostic study. Imaging features are similar to previous exam and the appendix appears diffusely hypermetabolic. SKELETON No focal hypermetabolic activity to suggest skeletal metastasis. IMPRESSION: 1. Two pleural-based nodules in the right hemithorax are hypermetabolic. Metastatic disease is a distinct consideration. The scattered pulmonary parenchymal nodule seen on previous diagnostic CT imaging are below the threshold for reliable resolution on PET imaging. 2. Hypermetabolic lesion pancreatic head, consistent with neoplasm. As noted on prior CT, adenocarcinoma is any consideration. No evidence for hypermetabolic abdominal lymphadenopathy. Mottled uptake noted in the liver, but no discrete hepatic metastases are evident  on PET imaging. 3. Appendix remains distended up to 09-10 mm diameter with a stone towards the tip in subtle periappendiceal edema/inflammation. The appendix is hypermetabolic along its length. Imaging features are relatively stable in the 12 day interval since prior CT scan. While appendicitis is a consideration, the relative stability over 12 days would be unusual for that etiology. Continued close follow-up recommended. Electronically Signed   By: Misty Stanley M.D.   On: 06/25/2016 13:39   Dg Chest Port 1 View  Result Date: 07/23/2016 CLINICAL DATA:  Acute onset of fever and generalized weakness. Recent chemotherapy. Vomiting. Initial encounter. EXAM: PORTABLE CHEST 1 VIEW COMPARISON:  Chest radiograph performed 07/07/2016 FINDINGS: Mild right basilar  airspace opacity raises concern for pneumonia. No pleural effusion or pneumothorax is seen. The cardiomediastinal silhouette is normal in size. No acute osseous abnormalities are identified. IMPRESSION: Mild right basilar airspace opacity raises concern for pneumonia. Electronically Signed   By: Garald Balding M.D.   On: 07/23/2016 02:14   Dg Chest Port 1 View  Result Date: 07/07/2016 CLINICAL DATA:  Status post chest wall port placement EXAM: PORTABLE CHEST 1 VIEW COMPARISON:  06/13/2016 FINDINGS: Right chest wall port has been placed in satisfactory position. No pneumothorax is noted. A nodular density is noted projecting over the posterior aspect of the seventh rib on the right corresponding to previously seen nodules. No other focal abnormality is noted. IMPRESSION: Changes of known metastatic disease as well as new port placement. No pneumothorax is noted. Electronically Signed   By: Inez Catalina M.D.   On: 07/07/2016 09:22   Dg C-arm 1-60 Min-no Report  Result Date: 07/07/2016 Fluoroscopy was utilized by the requesting physician.  No radiographic interpretation.       Subjective: No complaints.  Discharge Exam: Vitals:   07/23/16 2205 07/24/16 0647  BP: 123/82 108/72  Pulse: (!) 105 92  Resp: 18 18  Temp: 98.7 F (37.1 C) 98.2 F (36.8 C)   Vitals:   07/23/16 1947 07/23/16 2205 07/24/16 0647 07/24/16 0915  BP:  123/82 108/72   Pulse:  (!) 105 92   Resp:  18 18   Temp:  98.7 F (37.1 C) 98.2 F (36.8 C)   TempSrc:  Oral Oral   SpO2: 92% 96% 96% 96%  Weight:      Height:        General: Pt is alert, awake, not in acute distress Cardiovascular: RRR, S1/S2 +, no rubs, no gallops Respiratory: CTA bilaterally, no wheezing, no rhonchi Abdominal: Soft, NT, ND, bowel sounds + Extremities: no edema, no cyanosis    The results of significant diagnostics from this hospitalization (including imaging, microbiology, ancillary and laboratory) are listed below for reference.      Microbiology: Recent Results (from the past 240 hour(s))  Urine culture     Status: None   Collection Time: 07/23/16  1:08 AM  Result Value Ref Range Status   Specimen Description URINE, CLEAN CATCH  Final   Special Requests NONE  Final   Culture   Final    NO GROWTH Performed at [facility], [address] [place], [place] [address]    Report Status 07/24/2016 FINAL  Final  Blood Culture (routine x 2)     Status: None (Preliminary result)   Collection Time: 07/23/16  1:43 AM  Result Value Ref Range Status   Specimen Description BLOOD LEFT ARM  Final   Special Requests BOTTLES DRAWN AEROBIC AND ANAEROBIC [place]  Final  Culture NO GROWTH < 12 HOURS  Final   Report Status PENDING  Incomplete  Blood Culture (routine x 2)     Status: None (Preliminary result)   Collection Time: 07/23/16  1:47 AM  Result Value Ref Range Status   Specimen Description BLOOD LEFT HAND  Final   Special Requests BOTTLES DRAWN AEROBIC AND ANAEROBIC 6CC  Final   Culture NO GROWTH < 12 HOURS  Final   Report Status PENDING  Incomplete     Labs: BNP (last 3 results) No results for input(s): BNP in the last 8760 hours. Basic Metabolic Panel:  Recent Labs Lab 07/22/16 0856 07/23/16 0105 07/24/16 0521  NA 136 136 130*  K 3.8 3.7 3.9  CL 107 105 100*  CO2 23 21* 22  GLUCOSE 298* 193* 227*  BUN '11 13 13  '$ CREATININE 0.83 0.90 0.70  CALCIUM 8.3* 9.1 8.2*   Liver Function Tests:  Recent Labs Lab 07/22/16 0856 07/23/16 0105  AST 23 27  ALT 25 28  ALKPHOS 84 75  BILITOT 1.0 1.4*  PROT 5.7* 6.7  ALBUMIN 3.3* 3.7   No results for input(s): LIPASE, AMYLASE in the last 168 hours. No results for input(s): AMMONIA in the last 168 hours. CBC:  Recent Labs Lab 07/22/16 0856 07/23/16 0105 07/23/16 0659  WBC 4.6 8.0 7.9  NEUTROABS 2.6 7.4 7.3  HGB 12.2* 13.4 13.5  HCT 35.6* 38.4* 38.3*  MCV 87.3 85.1 85.9  PLT 157 149* 163   Cardiac Enzymes: No results for input(s): CKTOTAL,  CKMB, CKMBINDEX, TROPONINI in the last 168 hours. BNP: Invalid input(s): POCBNP CBG:  Recent Labs Lab 07/23/16 1129 07/23/16 1610 07/23/16 2213 07/24/16 0754 07/24/16 1148  GLUCAP 109* 185* 171* 184* 226*   D-Dimer No results for input(s): DDIMER in the last 72 hours. Hgb A1c No results for input(s): HGBA1C in the last 72 hours. Lipid Profile No results for input(s): CHOL, HDL, LDLCALC, TRIG, CHOLHDL, LDLDIRECT in the last 72 hours. Thyroid function studies No results for input(s): TSH, T4TOTAL, T3FREE, THYROIDAB in the last 72 hours.  Invalid input(s): FREET3 Anemia work up No results for input(s): VITAMINB12, FOLATE, FERRITIN, TIBC, IRON, RETICCTPCT in the last 72 hours. Urinalysis    Component Value Date/Time   COLORURINE YELLOW 07/23/2016 0108   APPEARANCEUR CLEAR 07/23/2016 0108   LABSPEC 1.025 07/23/2016 0108   PHURINE 5.5 07/23/2016 0108   GLUCOSEU NEGATIVE 07/23/2016 0108   HGBUR NEGATIVE 07/23/2016 0108   BILIRUBINUR NEGATIVE 07/23/2016 0108   KETONESUR NEGATIVE 07/23/2016 0108   PROTEINUR NEGATIVE 07/23/2016 0108   UROBILINOGEN 1.0 11/14/2014 1504   NITRITE NEGATIVE 07/23/2016 0108   LEUKOCYTESUR NEGATIVE 07/23/2016 0108   Sepsis Labs Invalid input(s): PROCALCITONIN,  WBC,  LACTICIDVEN Microbiology Recent Results (from the past 240 hour(s))  Urine culture     Status: None   Collection Time: 07/23/16  1:08 AM  Result Value Ref Range Status   Specimen Description URINE, CLEAN CATCH  Final   Special Requests NONE  Final   Culture   Final    NO GROWTH Performed at [facility], [place] [address], [place], [place] [address]    Report Status 07/24/2016 FINAL  Final  Blood Culture (routine x 2)     Status: None (Preliminary result)   Collection Time: 07/23/16  1:43 AM  Result Value Ref Range Status   Specimen Description BLOOD LEFT ARM  Final   Special Requests BOTTLES DRAWN AEROBIC AND ANAEROBIC [facility]  Final   Culture  NO GROWTH < 12 HOURS  Final    Report Status PENDING  Incomplete  Blood Culture (routine x 2)     Status: None (Preliminary result)   Collection Time: 07/23/16  1:47 AM  Result Value Ref Range Status   Specimen Description BLOOD LEFT HAND  Final   Special Requests BOTTLES DRAWN AEROBIC AND ANAEROBIC 6CC  Final   Culture NO GROWTH < 12 HOURS  Final   Report Status PENDING  Incomplete     Time coordinating discharge: Over 30 minutes  SIGNED:   Kathie Dike, MD  [facility] 07/24/2016, 2:10 PM Pager   If 7PM-7AM, please contact night-coverage [email or web address] Password [email or web address]

## 2016-07-24 NOTE — Progress Notes (Signed)
Charles [name] discharged Home per MD order.  Discharge instructions reviewed and discussed with the patient, all questions and concerns answered. Copy of instructions and scripts given to patient.  Allergies as of 07/24/2016      Reactions   Oxycodone    Blisters, hallucinations   Tramadol    Blisters, hallucinations   Trazodone And Nefazodone Other (See Comments)   hallucinations      Medication List    TAKE these medications   clonazePAM 0.5 MG tablet Commonly known as:  KLONOPIN Take 0.5 mg by mouth 2 (two) times daily as needed for anxiety.   dexamethasone 4 MG tablet Commonly known as:  DECADRON Take 2 tablets (8 mg total) by mouth daily. Start the day after chemotherapy for 2 days. Take with food.   FIRST-DUKES MOUTHWASH Susp Use as directed 5 mLs in the mouth or throat 4 (four) times daily.   fluorouracil CALGB 66063 in sodium chloride 0.9 % 150 mL Inject into the vein over 96 hr.   gabapentin 300 MG capsule Commonly known as:  NEURONTIN Take 3 capsules (900 mg total) by mouth 2 (two) times daily.   glimepiride 2 MG tablet Commonly known as:  AMARYL Take 2 mg by mouth 2 (two) times daily.   HYDROcodone-acetaminophen 5-325 MG tablet Commonly known as:  NORCO/VICODIN Take 1 tablet by mouth every 6 (six) hours as needed for moderate pain.   leucovorin in dextrose 5 % 250 mL Inject into the vein once.   levofloxacin 750 MG tablet Commonly known as:  LEVAQUIN Take 1 tablet (750 mg total) by mouth daily.   Lidocaine 2 % Gel Apply 1 application topically 4 (four) times daily as needed.   lidocaine-prilocaine cream Commonly known as:  EMLA Apply to affected area once   LORazepam 0.5 MG tablet Commonly known as:  ATIVAN Take 1 tablet (0.5 mg total) by mouth every 6 (six) hours as needed (Nausea or vomiting).   mirtazapine 7.5 MG tablet Commonly known as:  REMERON Take 7.5 mg by mouth at bedtime.   omeprazole 20 MG capsule Commonly known as:   PRILOSEC Take 20 mg by mouth 2 (two) times daily before a meal.   ondansetron 8 MG tablet Commonly known as:  ZOFRAN Take 1 tablet (8 mg total) by mouth 2 (two) times daily as needed for refractory nausea / vomiting. Start on day 3 after chemotherapy.   OXALIPLATIN IV Inject into the vein.   prochlorperazine 10 MG tablet Commonly known as:  COMPAZINE Take 1 tablet (10 mg total) by mouth every 6 (six) hours as needed (Nausea or vomiting).   psyllium 58.6 % powder Commonly known as:  METAMUCIL Take 1 packet by mouth daily.   rivaroxaban 20 MG Tabs tablet Commonly known as:  XARELTO Take 1 tablet (20 mg total) by mouth daily with supper.   tamsulosin 0.4 MG Caps capsule Commonly known as:  FLOMAX Take 0.4 mg by mouth at bedtime.   traZODone 100 MG tablet Commonly known as:  DESYREL Take 100 mg by mouth at bedtime.   zinc oxide 20 % ointment Apply 1 application topically as needed for irritation.       Patients skin is clean, dry and intact, no evidence of skin break down. IV site discontinued and catheter remains intact. Site without signs and symptoms of complications. Dressing and pressure applied.  Patient escorted to car by NT in a wheelchair,  no distress noted upon discharge.  Ralene Muskrat Tashe Purdon 07/24/2016 3:25 PM

## 2016-07-25 ENCOUNTER — Telehealth (HOSPITAL_COMMUNITY): Payer: Self-pay

## 2016-07-25 ENCOUNTER — Telehealth (HOSPITAL_COMMUNITY): Payer: Self-pay | Admitting: *Deleted

## 2016-07-25 ENCOUNTER — Telehealth: Payer: Self-pay

## 2016-07-25 NOTE — Telephone Encounter (Signed)
Patients wife called stating patient's "bottom" was swollen and painful. She states he has a hard time having a bowel movement. He was discharged from the hospital today and is going home on antibiotics. He also has an appt with Dr. [name] on 08/14/16. Reviewed with PA-C, instructed patient to follow plan from last visit with PA, including lidocaine gel, Desitin, warm sitz baths and soft wet wipes. Call if condition worsens or questions. Wife verbalized understanding.

## 2016-07-25 NOTE — Telephone Encounter (Signed)
Pt called requesting Dr Gershon Mussel send in a prescription but wanting to talk to him first.  Pt goes to [facility]. A voice message was left at [facility] and this message forwarded.

## 2016-07-28 LAB — CULTURE, BLOOD (ROUTINE X 2)
Culture: NO GROWTH
Culture: NO GROWTH

## 2016-07-31 DIAGNOSIS — G4700 Insomnia, unspecified: Secondary | ICD-10-CM | POA: Diagnosis not present

## 2016-07-31 DIAGNOSIS — Z713 Dietary counseling and surveillance: Secondary | ICD-10-CM | POA: Diagnosis not present

## 2016-07-31 DIAGNOSIS — Z6824 Body mass index (BMI) 24.0-24.9, adult: Secondary | ICD-10-CM | POA: Diagnosis not present

## 2016-07-31 DIAGNOSIS — Z299 Encounter for prophylactic measures, unspecified: Secondary | ICD-10-CM | POA: Diagnosis not present

## 2016-07-31 DIAGNOSIS — N400 Enlarged prostate without lower urinary tract symptoms: Secondary | ICD-10-CM | POA: Diagnosis not present

## 2016-07-31 DIAGNOSIS — G629 Polyneuropathy, unspecified: Secondary | ICD-10-CM | POA: Diagnosis not present

## 2016-07-31 DIAGNOSIS — C3490 Malignant neoplasm of unspecified part of unspecified bronchus or lung: Secondary | ICD-10-CM | POA: Diagnosis not present

## 2016-07-31 DIAGNOSIS — J189 Pneumonia, unspecified organism: Secondary | ICD-10-CM | POA: Diagnosis not present

## 2016-07-31 DIAGNOSIS — Z09 Encounter for follow-up examination after completed treatment for conditions other than malignant neoplasm: Secondary | ICD-10-CM | POA: Diagnosis not present

## 2016-08-05 ENCOUNTER — Encounter (HOSPITAL_COMMUNITY): Payer: Medicare Other | Attending: Oncology

## 2016-08-05 VITALS — BP 131/80 | HR 72 | Temp 97.5°F | Resp 16 | Wt 208.2 lb

## 2016-08-05 DIAGNOSIS — Z5111 Encounter for antineoplastic chemotherapy: Secondary | ICD-10-CM | POA: Diagnosis not present

## 2016-08-05 DIAGNOSIS — Z5112 Encounter for antineoplastic immunotherapy: Secondary | ICD-10-CM

## 2016-08-05 DIAGNOSIS — C20 Malignant neoplasm of rectum: Secondary | ICD-10-CM | POA: Diagnosis not present

## 2016-08-05 DIAGNOSIS — E876 Hypokalemia: Secondary | ICD-10-CM

## 2016-08-05 LAB — CBC WITH DIFFERENTIAL/PLATELET
Basophils Absolute: 0 10*3/uL (ref 0.0–0.1)
Basophils Relative: 0 %
Eosinophils Absolute: 0.1 10*3/uL (ref 0.0–0.7)
Eosinophils Relative: 2 %
HCT: 34.2 % — ABNORMAL LOW (ref 39.0–52.0)
Hemoglobin: 11.8 g/dL — ABNORMAL LOW (ref 13.0–17.0)
Lymphocytes Relative: 45 %
Lymphs Abs: 2 10*3/uL (ref 0.7–4.0)
MCH: 30.6 pg (ref 26.0–34.0)
MCHC: 34.5 g/dL (ref 30.0–36.0)
MCV: 88.6 fL (ref 78.0–100.0)
Monocytes Absolute: 0.4 10*3/uL (ref 0.1–1.0)
Monocytes Relative: 9 %
Neutro Abs: 2 10*3/uL (ref 1.7–7.7)
Neutrophils Relative %: 44 %
Platelets: 170 10*3/uL (ref 150–400)
RBC: 3.86 MIL/uL — ABNORMAL LOW (ref 4.22–5.81)
RDW: 15 % (ref 11.5–15.5)
WBC: 4.5 10*3/uL (ref 4.0–10.5)

## 2016-08-05 LAB — URINALYSIS, DIPSTICK ONLY
Bilirubin Urine: NEGATIVE
Glucose, UA: 250 mg/dL — AB
Hgb urine dipstick: NEGATIVE
Ketones, ur: NEGATIVE mg/dL
Leukocytes, UA: NEGATIVE
Nitrite: NEGATIVE
Protein, ur: NEGATIVE mg/dL
Specific Gravity, Urine: 1.025 (ref 1.005–1.030)
pH: 6 (ref 5.0–8.0)

## 2016-08-05 LAB — COMPREHENSIVE METABOLIC PANEL
ALT: 18 U/L (ref 17–63)
AST: 23 U/L (ref 15–41)
Albumin: 3.1 g/dL — ABNORMAL LOW (ref 3.5–5.0)
Alkaline Phosphatase: 75 U/L (ref 38–126)
Anion gap: 5 (ref 5–15)
BUN: 9 mg/dL (ref 6–20)
CO2: 25 mmol/L (ref 22–32)
Calcium: 8.2 mg/dL — ABNORMAL LOW (ref 8.9–10.3)
Chloride: 110 mmol/L (ref 101–111)
Creatinine, Ser: 0.67 mg/dL (ref 0.61–1.24)
GFR calc Af Amer: >60 mL/min (ref >60)
GFR calc non Af Amer: >60 mL/min (ref >60)
Glucose, Bld: 179 mg/dL — ABNORMAL HIGH (ref 65–99)
Potassium: 3.2 mmol/L — ABNORMAL LOW (ref 3.5–5.1)
Sodium: 140 mmol/L (ref 135–145)
Total Bilirubin: 1.2 mg/dL (ref 0.3–1.2)
Total Protein: 5.6 g/dL — ABNORMAL LOW (ref 6.5–8.1)

## 2016-08-05 MED ORDER — LEUCOVORIN CALCIUM INJECTION 350 MG
400.0000 mg/m2 | Freq: Once | INTRAMUSCULAR | Status: AC
  Administered 2016-08-05: 880 mg via INTRAVENOUS
  Filled 2016-08-05: qty 44

## 2016-08-05 MED ORDER — OXALIPLATIN CHEMO INJECTION 100 MG/20ML
85.0000 mg/m2 | Freq: Once | INTRAVENOUS | Status: AC
  Administered 2016-08-05: 185 mg via INTRAVENOUS
  Filled 2016-08-05: qty 37

## 2016-08-05 MED ORDER — SODIUM CHLORIDE 0.9 % IV SOLN
5.0000 mg/kg | Freq: Once | INTRAVENOUS | Status: AC
  Administered 2016-08-05: 475 mg via INTRAVENOUS
  Filled 2016-08-05: qty 16

## 2016-08-05 MED ORDER — DEXAMETHASONE SODIUM PHOSPHATE 10 MG/ML IJ SOLN
10.0000 mg | Freq: Once | INTRAMUSCULAR | Status: AC
  Administered 2016-08-05: 10 mg via INTRAVENOUS
  Filled 2016-08-05: qty 1

## 2016-08-05 MED ORDER — SODIUM CHLORIDE 0.9 % IV SOLN
2400.0000 mg/m2 | INTRAVENOUS | Status: DC
  Administered 2016-08-05: 5300 mg via INTRAVENOUS
  Filled 2016-08-05: qty 100

## 2016-08-05 MED ORDER — PALONOSETRON HCL INJECTION 0.25 MG/5ML
0.2500 mg | Freq: Once | INTRAVENOUS | Status: AC
  Administered 2016-08-05: 0.25 mg via INTRAVENOUS
  Filled 2016-08-05: qty 5

## 2016-08-05 MED ORDER — FLUOROURACIL CHEMO INJECTION 2.5 GM/50ML
400.0000 mg/m2 | Freq: Once | INTRAVENOUS | Status: AC
  Administered 2016-08-05: 900 mg via INTRAVENOUS
  Filled 2016-08-05: qty 18

## 2016-08-05 MED ORDER — SODIUM CHLORIDE 0.9 % IV SOLN
INTRAVENOUS | Status: DC
  Administered 2016-08-05: 10:00:00 via INTRAVENOUS

## 2016-08-05 MED ORDER — DEXTROSE 5 % IV SOLN
Freq: Once | INTRAVENOUS | Status: AC
  Administered 2016-08-05: 12:00:00 via INTRAVENOUS

## 2016-08-05 MED ORDER — SODIUM CHLORIDE 0.9 % IV SOLN: 10.0000 mg | Freq: Once | INTRAVENOUS | Status: DC

## 2016-08-05 MED ORDER — POTASSIUM CHLORIDE CRYS ER 20 MEQ PO TBCR
60.0000 meq | EXTENDED_RELEASE_TABLET | Freq: Two times a day (BID) | ORAL | Status: DC
  Administered 2016-08-05: 60 meq via ORAL
  Filled 2016-08-05: qty 3

## 2016-08-05 NOTE — Progress Notes (Signed)
Tolerated chemo well. Continuous infusion pump intact. Stable and ambulatory on discharge home with wife. 

## 2016-08-05 NOTE — Patient Instructions (Signed)
[facility] Discharge Instructions for Patients Receiving Chemotherapy   Beginning January 23rd 2017 lab work for the [facility] will be done in the  Main lab at [facility] on 1st floor. If you have a lab appointment with the [facility] please come in thru the  Main Entrance and check in at the main information desk   Today you received the following chemotherapy agents avastin, oxaliplatin, leucovorin and 30f pump.  To help prevent nausea and vomiting after your treatment, we encourage you to take your nausea medication as instructed.   If you develop nausea and vomiting, or diarrhea that is not controlled by your medication, call the clinic.  The clinic phone number is [phone] Office hours are Monday-Friday 8:30am-5:00pm.  BELOW ARE SYMPTOMS THAT SHOULD BE REPORTED IMMEDIATELY:  *FEVER GREATER THAN 101.0 F  *CHILLS WITH OR WITHOUT FEVER  NAUSEA AND VOMITING THAT IS NOT CONTROLLED WITH YOUR NAUSEA MEDICATION  *UNUSUAL SHORTNESS OF BREATH  *UNUSUAL BRUISING OR BLEEDING  TENDERNESS IN MOUTH AND THROAT WITH OR WITHOUT PRESENCE OF ULCERS  *URINARY PROBLEMS  *BOWEL PROBLEMS  UNUSUAL RASH Items with * indicate a potential emergency and should be followed up as soon as possible. If you have an emergency after office hours please contact your primary care physician or go to the nearest emergency department.  Please call the clinic during office hours if you have any questions or concerns.   You may also contact the Patient Navigator at ([phone] you have any questions or need assistance in obtaining follow up care.      Resources For Cancer Patients and their Caregivers ? American Cancer Society: Can assist with transportation, wigs, general needs, runs Look Good Feel Better.        1212-208-4370? Cancer Care: Provides financial assistance, online support groups, medication/co-pay assistance.  1-800-813-HOPE (804-365-3789 ? [facility] [place] cancer patients and their families through emotional , educational and financial support.  3660 422 1666? [place] DSS Where to apply for food stamps, Medicaid and utility assistance. 3562 398 6528? [facility]: Transportation to medical appointments. 3513-219-9929? Social Security Administration: May apply for disability if have a Stage IV cancer. 357064923401(575)783-8360? [employer]: Assists with nutrition, care and transit needs. [phone]

## 2016-08-06 LAB — CEA: CEA: 3.1 ng/mL (ref 0.0–4.7)

## 2016-08-07 ENCOUNTER — Encounter (HOSPITAL_BASED_OUTPATIENT_CLINIC_OR_DEPARTMENT_OTHER): Payer: Medicare Other

## 2016-08-07 VITALS — BP 140/77 | HR 59 | Temp 97.8°F | Resp 20

## 2016-08-07 DIAGNOSIS — C20 Malignant neoplasm of rectum: Secondary | ICD-10-CM

## 2016-08-07 DIAGNOSIS — Z452 Encounter for adjustment and management of vascular access device: Secondary | ICD-10-CM | POA: Diagnosis not present

## 2016-08-07 MED ORDER — SODIUM CHLORIDE 0.9% FLUSH
10.0000 mL | INTRAVENOUS | Status: DC | PRN
  Administered 2016-08-07: 10 mL
  Filled 2016-08-07: qty 10

## 2016-08-07 MED ORDER — HEPARIN SOD (PORK) LOCK FLUSH 100 UNIT/ML IV SOLN
500.0000 [IU] | Freq: Once | INTRAVENOUS | Status: AC | PRN
  Administered 2016-08-07: 500 [IU]

## 2016-08-07 NOTE — Progress Notes (Signed)
Charles [name] presented for pump removal/Portacath deaccess and flush.  Portacath located rt chest wall accessed with  H 20 needle. Good blood return present. Portacath flushed with 10m NS and 500U/575mHeparin and needle removed intact. Procedure without incident. Patient tolerated procedure well.  [id number] removed.

## 2016-08-14 ENCOUNTER — Encounter: Payer: Self-pay | Admitting: Gastroenterology

## 2016-08-14 ENCOUNTER — Ambulatory Visit (INDEPENDENT_AMBULATORY_CARE_PROVIDER_SITE_OTHER): Payer: Medicare Other | Admitting: Gastroenterology

## 2016-08-14 VITALS — BP 138/80 | HR 91 | Temp 97.0°F | Ht 74.0 in | Wt 202.4 lb

## 2016-08-14 DIAGNOSIS — K6289 Other specified diseases of anus and rectum: Secondary | ICD-10-CM | POA: Diagnosis not present

## 2016-08-14 MED ORDER — HYDROCORTISONE 2.5 % RE CREA: 1.0000 "application " | TOPICAL_CREAM | Freq: Two times a day (BID) | RECTAL | 0 refills | Status: DC

## 2016-08-14 NOTE — Patient Instructions (Addendum)
1. I suspect you have an anorectal fissure. If you do not show signs of improvement you may need colonoscopy versus repeat CT scan.  2. Avoid straining or prolonged sitting on the toilet.   3. We will try nitroglycerin ointment to apply inside the rectum twice a day. Alternate with hydrocortisone hemorrhoid cream twice a day, you can use over the counter or prescription whichever is cheaper. Both prescriptions sent to [facility]. 4. Try hold metamucil to see if number of stools decrease. If no change after a few days, then try Fiber Choice chew 2 daily. Last thing to try would be imodium 1/2 tablet once daily. Avoid hard stools.  5. Call and let me know how you are doing next week.

## 2016-08-14 NOTE — Progress Notes (Addendum)
Primary Care Physician: [name], MD  Primary Gastroenterologist:  Garfield Cornea, MD   Chief Complaint  Patient presents with  . blisters inside rectum    "fissures", painful with bm (8-15/day, soft)    HPI: Charles [name] is a 66 y.o. male here for further evaluation of rectal pain. Patient has a history of stage III cancer of the rectum status post preoperative radiation and chemotherapy followed by definitive surgery. Initially diagnosed in 2012. In 2016 was noted to have solitary pulmonary metastasis which was surgically resected.  He had restaging CTs due to 14 pound weight loss back in February. Initially weight loss felt to be related to new onset diabetes but enough concern to move his CTs up. He was found to have a new right-sided pleural metastasis/mass, smaller right-sided pulmonary nodules which were pleural-based, pancreatic atrophy, duct dilation, pancreatic head soft tissue fullness highly suspicious for pancreatic adenocarcinoma versus metastatic disease felt to be less likely.  EUS with Dr. Ardis Hughs on 06/19/2016 suggestive of spread of adenocarcinoma from the colon to the pancreas.  PET scan 06/25/2016 there was hypermetabolic activity at the pancreatic head as well as 2 pleural-based nodules.  Started on FOLFOX and Avastin chemotherapy every 2 weeks with plans to restage with PET CT after 4 cycles.  Patient was seen 07/14/2016 by Robynn Pane PA-C for rectal pain. Also with increased stool frequency. On anorectal exam he had a lot of erythema noted externally, tenderness with spreading of the buttocks. Rectal exam deferred due to patient discomfort. Suspected to have fissure. Started on lidocaine gel, zinc oxide cream.  Patient reports that he has anywhere from 8-15 solid small bowel movements every day. This is been going on for a couple of months. He constantly feels the urge to have a bowel movement. He wakes up in the middle the night at least 3-4 times  to have a bowel movement. When it first starts to pass he has severe pain which resolves after the bowel movement. He had significant rash externally which is now resolved with the use of Desitin. He denies loose stools. Feels like a barbed wire in his rectum when he passes his stool. He has a lot of gas and a terrible odor. He believes this is due to chemotherapy. Only new medication is Amaryl. Lidocaine doesn't help very much. He's used a whole tube. Patient states that his oncologist does not feel he is having a reaction to his chemotherapy. He saw blood only once after applying rectal cream. He has 1 more round of chemotherapy before his next imaging is done. Denies upper GI symptoms. His appetite is good. His weight has been fairly stable over the past one month.   Last colonoscopy July 2015, surgical anastomosis noted at 5 cm, lumen somewhat narrow at this point but not critical. Overlying mucosa fibrotic but no evidence of neoplastic process. Biopsy benign. Plans for surveillance colonoscopy this year.  Current Outpatient Prescriptions  Medication Sig Dispense Refill  . clonazePAM (KLONOPIN) 0.5 MG tablet Take 0.5 mg by mouth 2 (two) times daily as needed for anxiety.    Marland Kitchen dexamethasone (DECADRON) 4 MG tablet Take 2 tablets (8 mg total) by mouth daily. Start the day after chemotherapy for 2 days. Take with food. 30 tablet 1  . Diphenhyd-Hydrocort-Nystatin (FIRST-DUKES MOUTHWASH) SUSP Use as directed 5 mLs in the mouth or throat 4 (four) times daily. 360 mL 3  . fluorouracil CALGB 53614 in sodium chloride 0.9 % 150 mL  Inject into the vein over 96 hr.    . gabapentin (NEURONTIN) 300 MG capsule Take 3 capsules (900 mg total) by mouth 2 (two) times daily. 540 capsule 1  . glimepiride (AMARYL) 2 MG tablet Take 2 mg by mouth 2 (two) times daily.     Marland Kitchen HYDROcodone-acetaminophen (NORCO/VICODIN) 5-325 MG tablet Take 1 tablet by mouth every 6 (six) hours as needed for moderate pain. 60 tablet 0  .  leucovorin in dextrose 5 % 250 mL Inject into the vein once.    . Lidocaine 2 % GEL Apply 1 application topically 4 (four) times daily as needed. 28.33 g 1  . lidocaine-prilocaine (EMLA) cream Apply to affected area once 30 g 3  . LORazepam (ATIVAN) 0.5 MG tablet Take 1 tablet (0.5 mg total) by mouth every 6 (six) hours as needed (Nausea or vomiting). 30 tablet 0  . mirtazapine (REMERON) 7.5 MG tablet Take 7.5 mg by mouth at bedtime.    Marland Kitchen omeprazole (PRILOSEC) 20 MG capsule Take 20 mg by mouth 2 (two) times daily before a meal.     . ondansetron (ZOFRAN) 8 MG tablet Take 1 tablet (8 mg total) by mouth 2 (two) times daily as needed for refractory nausea / vomiting. Start on day 3 after chemotherapy. 30 tablet 1  . OXALIPLATIN IV Inject into the vein.    Marland Kitchen prochlorperazine (COMPAZINE) 10 MG tablet Take 1 tablet (10 mg total) by mouth every 6 (six) hours as needed (Nausea or vomiting). 30 tablet 1  . psyllium (METAMUCIL) 58.6 % powder Take 1 packet by mouth daily.     . rivaroxaban (XARELTO) 20 MG TABS tablet Take 1 tablet (20 mg total) by mouth daily with supper. 30 tablet 3  . tamsulosin (FLOMAX) 0.4 MG CAPS capsule Take 0.4 mg by mouth at bedtime.     . traZODone (DESYREL) 100 MG tablet Take 100 mg by mouth at bedtime.    Marland Kitchen zinc oxide 20 % ointment Apply 1 application topically as needed for irritation. 56.7 g 1   No current facility-administered medications for this visit.    Facility-Administered Medications Ordered in Other Visits  Medication Dose Route Frequency Provider Last Rate Last Dose  . sodium chloride 0.9 % injection 10 mL  10 mL Intravenous PRN Everardo All, MD   10 mL at 08/22/11 1114    Allergies as of 08/14/2016 - Review Complete 08/14/2016  Allergen Reaction Noted  . Oxycodone  11/23/2014  . Tramadol  11/23/2014  . Trazodone and nefazodone Other (See Comments) 09/08/2014    ROS:  General: Negative for anorexia,  fever, chills, fatigue, weakness. See hpi ENT:  Negative for hoarseness, difficulty swallowing , nasal congestion. CV: Negative for chest pain, angina, palpitations, dyspnea on exertion, peripheral edema.  Respiratory: Negative for dyspnea at rest, dyspnea on exertion, cough, sputum, wheezing.  GI: See history of present illness. GU:  Negative for dysuria, hematuria, urinary incontinence, urinary frequency, nocturnal urination.  Endo: see hpi   Physical Examination:   BP 138/80   Pulse 91   Temp 97 F (36.1 C) (Oral)   Ht '6\' 2"'$  (1.88 m)   Wt 202 lb 6.4 oz (91.8 kg)   BMI 25.99 kg/m   General: Well-nourished, well-developed in no acute distress. Accompanied by wife Eyes: No icterus. Mouth: Oropharyngeal mucosa moist and pink , no lesions erythema or exudate. Lungs: Clear to auscultation bilaterally.  Heart: Regular rate and rhythm, no murmurs rubs or gallops.  Abdomen: Bowel sounds are normal,  nontender, nondistended, no hepatosplenomegaly or masses, no abdominal bruits or hernia , no rebound or guarding.   RECTAL: external rash cleared, palpated around the anus and buttocks with mild tenderness noted posterior to anus. No mass or fluctuant. Upon entering the anus, pain noted posteriorly and anteriorly. Exam uncomfortable but patient desires completion. No mass felt. Soft stool present without overt bleeding. Extremities: No lower extremity edema. No clubbing or deformities. Neuro: Alert and oriented x 4   Skin: Warm and dry, no jaundice.   Psych: Alert and cooperative, normal mood and affect.  Labs:  Lab Results  Component Value Date   CREATININE 0.67 08/05/2016   BUN 9 08/05/2016   NA 140 08/05/2016   K 3.2 (L) 08/05/2016   CL 110 08/05/2016   CO2 25 08/05/2016   Lab Results  Component Value Date   ALT 18 08/05/2016   AST 23 08/05/2016   ALKPHOS 75 08/05/2016   BILITOT 1.2 08/05/2016   Lab Results  Component Value Date   WBC 4.5 08/05/2016   HGB 11.8 (L) 08/05/2016   HCT 34.2 (L) 08/05/2016   MCV 88.6  08/05/2016   PLT 170 08/05/2016     Imaging Studies: Dg Chest Port 1 View  Result Date: 07/23/2016 CLINICAL DATA:  Acute onset of fever and generalized weakness. Recent chemotherapy. Vomiting. Initial encounter. EXAM: PORTABLE CHEST 1 VIEW COMPARISON:  Chest radiograph performed 07/07/2016 FINDINGS: Mild right basilar airspace opacity raises concern for pneumonia. No pleural effusion or pneumothorax is seen. The cardiomediastinal silhouette is normal in size. No acute osseous abnormalities are identified. IMPRESSION: Mild right basilar airspace opacity raises concern for pneumonia. Electronically Signed   By: Garald Balding M.D.   On: 07/23/2016 02:14

## 2016-08-15 NOTE — Assessment & Plan Note (Addendum)
66 year old gentleman with history of initially stage III rectal cancer diagnosed in 2012. Preoperative radiation and chemotherapy followed by definitive surgery with LAR. solitary pulmonary metastasis noted in 2016, surgically resected. Recently determined to have additional pulmonary metastasis as well as metastasis to the pancreatic head (biopsy confirmed) back in February after presenting with weight loss in the setting of new onset diabetes. Patient has had change in bowel habits over the past couple of months. Denies loose stools but is having increased frequency including nocturnal bowel movements. 8-15 stools daily compared to baseline 6. Single episode of bright red blood per rectum with administering topical regimen. Due for surveillance colonoscopy this year. Based on exam, clinical description suspected anorectal fissure as source of his rectal pain. Unclear as to why had increased frequency of performed BMs. According to the patient oncology does not feel is related to chemotherapy.  He will hold his fiber supplement for a few days and if no improvement in stool frequency then switch to fiber choice 2 daily. Consider mini dose of Imodium, 1 mg once daily but have to avoid hard stool. Nitroglycerin ointment 0.2% twice a day alternating with hydrocortisone twice a day. He'll call to progress report next week. In the meantime I will discuss case with Dr. [name]. Consider updating colonoscopy sooner if persistent rectal symptoms/change in bowel.

## 2016-08-18 NOTE — Progress Notes (Signed)
cc'ed to pcp °

## 2016-08-19 ENCOUNTER — Encounter (HOSPITAL_BASED_OUTPATIENT_CLINIC_OR_DEPARTMENT_OTHER): Payer: Medicare Other

## 2016-08-19 ENCOUNTER — Other Ambulatory Visit (HOSPITAL_COMMUNITY): Payer: Self-pay | Admitting: Oncology

## 2016-08-19 VITALS — BP 150/85 | HR 80 | Temp 97.7°F | Resp 18 | Wt 204.6 lb

## 2016-08-19 DIAGNOSIS — Z5111 Encounter for antineoplastic chemotherapy: Secondary | ICD-10-CM

## 2016-08-19 DIAGNOSIS — C20 Malignant neoplasm of rectum: Secondary | ICD-10-CM | POA: Diagnosis not present

## 2016-08-19 DIAGNOSIS — Z5112 Encounter for antineoplastic immunotherapy: Secondary | ICD-10-CM | POA: Diagnosis present

## 2016-08-19 LAB — CBC WITH DIFFERENTIAL/PLATELET
Basophils Absolute: 0 10*3/uL (ref 0.0–0.1)
Basophils Relative: 1 %
Eosinophils Absolute: 0.2 10*3/uL (ref 0.0–0.7)
Eosinophils Relative: 3 %
HCT: 38.2 % — ABNORMAL LOW (ref 39.0–52.0)
Hemoglobin: 12.8 g/dL — ABNORMAL LOW (ref 13.0–17.0)
Lymphocytes Relative: 33 %
Lymphs Abs: 1.8 10*3/uL (ref 0.7–4.0)
MCH: 30.6 pg (ref 26.0–34.0)
MCHC: 33.5 g/dL (ref 30.0–36.0)
MCV: 91.4 fL (ref 78.0–100.0)
Monocytes Absolute: 0.7 10*3/uL (ref 0.1–1.0)
Monocytes Relative: 13 %
Neutro Abs: 2.7 10*3/uL (ref 1.7–7.7)
Neutrophils Relative %: 50 %
Platelets: 153 10*3/uL (ref 150–400)
RBC: 4.18 MIL/uL — ABNORMAL LOW (ref 4.22–5.81)
RDW: 15.6 % — ABNORMAL HIGH (ref 11.5–15.5)
WBC: 5.3 10*3/uL (ref 4.0–10.5)

## 2016-08-19 LAB — URINALYSIS, DIPSTICK ONLY
Bilirubin Urine: NEGATIVE
Glucose, UA: 500 mg/dL — AB
Hgb urine dipstick: NEGATIVE
Ketones, ur: NEGATIVE mg/dL
Leukocytes, UA: NEGATIVE
Nitrite: NEGATIVE
Protein, ur: NEGATIVE mg/dL
Specific Gravity, Urine: >1.03 — ABNORMAL HIGH (ref 1.005–1.030)
pH: 5.5 (ref 5.0–8.0)

## 2016-08-19 LAB — COMPREHENSIVE METABOLIC PANEL
ALT: 33 U/L (ref 17–63)
AST: 29 U/L (ref 15–41)
Albumin: 3.4 g/dL — ABNORMAL LOW (ref 3.5–5.0)
Alkaline Phosphatase: 98 U/L (ref 38–126)
Anion gap: 7 (ref 5–15)
BUN: 12 mg/dL (ref 6–20)
CO2: 21 mmol/L — ABNORMAL LOW (ref 22–32)
Calcium: 8.4 mg/dL — ABNORMAL LOW (ref 8.9–10.3)
Chloride: 109 mmol/L (ref 101–111)
Creatinine, Ser: 0.76 mg/dL (ref 0.61–1.24)
GFR calc Af Amer: >60 mL/min (ref >60)
GFR calc non Af Amer: >60 mL/min (ref >60)
Glucose, Bld: 201 mg/dL — ABNORMAL HIGH (ref 65–99)
Potassium: 3.9 mmol/L (ref 3.5–5.1)
Sodium: 137 mmol/L (ref 135–145)
Total Bilirubin: 1.4 mg/dL — ABNORMAL HIGH (ref 0.3–1.2)
Total Protein: 6 g/dL — ABNORMAL LOW (ref 6.5–8.1)

## 2016-08-19 MED ORDER — SODIUM CHLORIDE 0.9 % IV SOLN
INTRAVENOUS | Status: DC
  Administered 2016-08-19: 10:00:00 via INTRAVENOUS

## 2016-08-19 MED ORDER — DEXTROSE 5 % IV SOLN
400.0000 mg/m2 | Freq: Once | INTRAVENOUS | Status: AC
  Administered 2016-08-19: 880 mg via INTRAVENOUS
  Filled 2016-08-19: qty 44

## 2016-08-19 MED ORDER — DEXTROSE 5 % IV SOLN
Freq: Once | INTRAVENOUS | Status: AC
  Administered 2016-08-19: 11:00:00 via INTRAVENOUS

## 2016-08-19 MED ORDER — SODIUM CHLORIDE 0.9 % IV SOLN
2400.0000 mg/m2 | INTRAVENOUS | Status: DC
  Administered 2016-08-19: 5300 mg via INTRAVENOUS
  Filled 2016-08-19: qty 50

## 2016-08-19 MED ORDER — SODIUM CHLORIDE 0.9 % IV SOLN: 10.0000 mg | Freq: Once | INTRAVENOUS | Status: DC

## 2016-08-19 MED ORDER — PALONOSETRON HCL INJECTION 0.25 MG/5ML
0.2500 mg | Freq: Once | INTRAVENOUS | Status: AC
  Administered 2016-08-19: 0.25 mg via INTRAVENOUS
  Filled 2016-08-19: qty 5

## 2016-08-19 MED ORDER — DEXAMETHASONE SODIUM PHOSPHATE 10 MG/ML IJ SOLN
10.0000 mg | Freq: Once | INTRAMUSCULAR | Status: AC
  Administered 2016-08-19: 10 mg via INTRAVENOUS
  Filled 2016-08-19: qty 1

## 2016-08-19 MED ORDER — DIPHENHYDRAMINE HCL 50 MG/ML IJ SOLN
50.0000 mg | Freq: Once | INTRAMUSCULAR | Status: AC
  Administered 2016-08-19: 50 mg via INTRAVENOUS
  Filled 2016-08-19: qty 1

## 2016-08-19 MED ORDER — OXALIPLATIN CHEMO INJECTION 100 MG/20ML
85.0000 mg/m2 | Freq: Once | INTRAVENOUS | Status: AC
  Administered 2016-08-19: 185 mg via INTRAVENOUS
  Filled 2016-08-19: qty 37

## 2016-08-19 MED ORDER — FLUOROURACIL CHEMO INJECTION 2.5 GM/50ML
400.0000 mg/m2 | Freq: Once | INTRAVENOUS | Status: AC
  Administered 2016-08-19: 900 mg via INTRAVENOUS
  Filled 2016-08-19: qty 18

## 2016-08-19 MED ORDER — SODIUM CHLORIDE 0.9 % IV SOLN
5.0000 mg/kg | Freq: Once | INTRAVENOUS | Status: AC
  Administered 2016-08-19: 475 mg via INTRAVENOUS
  Filled 2016-08-19: qty 16

## 2016-08-19 NOTE — Patient Instructions (Signed)
[facility] Discharge Instructions for Patients Receiving Chemotherapy   Beginning January 23rd 2017 lab work for the [facility] will be done in the  Main lab at [facility] on 1st floor. If you have a lab appointment with the [facility] please come in thru the  Main Entrance and check in at the main information desk   Today you received the following chemotherapy agents avastin, oxaliplatin, leucovorin and 75f pump.  To help prevent nausea and vomiting after your treatment, we encourage you to take your nausea medication as instructed. Take Benadryl if needed for hoarseness or "fat" tongue.   If you develop nausea and vomiting, or diarrhea that is not controlled by your medication, call the clinic.  The clinic phone number is [phone] Office hours are Monday-Friday 8:30am-5:00pm.  BELOW ARE SYMPTOMS THAT SHOULD BE REPORTED IMMEDIATELY:  *FEVER GREATER THAN 101.0 F  *CHILLS WITH OR WITHOUT FEVER  NAUSEA AND VOMITING THAT IS NOT CONTROLLED WITH YOUR NAUSEA MEDICATION  *UNUSUAL SHORTNESS OF BREATH  *UNUSUAL BRUISING OR BLEEDING  TENDERNESS IN MOUTH AND THROAT WITH OR WITHOUT PRESENCE OF ULCERS  *URINARY PROBLEMS  *BOWEL PROBLEMS  UNUSUAL RASH Items with * indicate a potential emergency and should be followed up as soon as possible. If you have an emergency after office hours please contact your primary care physician or go to the nearest emergency department.  Please call the clinic during office hours if you have any questions or concerns.   You may also contact the Patient Navigator at [phone] you have any questions or need assistance in obtaining follow up care.      Resources For Cancer Patients and their Caregivers ? American Cancer Society: Can assist with transportation, wigs, general needs, runs Look Good Feel Better.        12120597878? Cancer Care: Provides financial assistance, online support groups,  medication/co-pay assistance.  1-800-813-HOPE ((601)632-0882 ? [place] cancer patients and their families through emotional , educational and financial support.  3(780)672-6288? [place] DSS Where to apply for food stamps, Medicaid and utility assistance. 3(518)404-7665? RCATS: Transportation to medical appointments. 3430 518 4396? Social Security Administration: May apply for disability if have a Stage IV cancer. [phone]? [employer]ability and Transit Services: Assists with nutrition, care and transit needs. 35305233773

## 2016-08-19 NOTE — Progress Notes (Signed)
Labs reviewed with Dr.Zhou, ok for chemo and avastin today.  Tolerated chemo well. Denies any complaints. Continuous infusion pump intact. Stable and ambulatory on discharge home with wife.

## 2016-08-21 ENCOUNTER — Encounter (HOSPITAL_BASED_OUTPATIENT_CLINIC_OR_DEPARTMENT_OTHER): Payer: Medicare Other

## 2016-08-21 VITALS — BP 142/79 | HR 70 | Temp 98.1°F | Resp 18

## 2016-08-21 DIAGNOSIS — Z452 Encounter for adjustment and management of vascular access device: Secondary | ICD-10-CM

## 2016-08-21 DIAGNOSIS — C20 Malignant neoplasm of rectum: Secondary | ICD-10-CM | POA: Diagnosis not present

## 2016-08-21 MED ORDER — HEPARIN SOD (PORK) LOCK FLUSH 100 UNIT/ML IV SOLN
500.0000 [IU] | Freq: Once | INTRAVENOUS | Status: AC | PRN
  Administered 2016-08-21: 500 [IU]
  Filled 2016-08-21: qty 5

## 2016-08-21 MED ORDER — SODIUM CHLORIDE 0.9% FLUSH
10.0000 mL | INTRAVENOUS | Status: DC | PRN
  Administered 2016-08-21: 10 mL
  Filled 2016-08-21: qty 10

## 2016-08-21 NOTE — Progress Notes (Signed)
Continuous infusion pump stopped/completed on patients arrival. Disconnected pump and flushed port per protocol. Removed needle. Patient denies any complaints post chemo. States that the benadryl IV with chemo decreased the sensation of having a "fat" tongue the night of oxaliplatin, but he did take 1 benadryl po that night at bedtime. Stable and ambulatory on discharge home with wife.

## 2016-08-21 NOTE — Patient Instructions (Signed)
[place] at [facility] Discharge Instructions  RECOMMENDATIONS MADE BY THE CONSULTANT AND ANY TEST RESULTS WILL BE SENT TO YOUR REFERRING PHYSICIAN.  Continuous infusion pump disconnected and port flushed today. Return as scheduled.  Thank you for choosing [place] at [facility] to provide your oncology and hematology care.  To afford each patient quality time with our provider, please arrive at least 15 minutes before your scheduled appointment time.    If you have a lab appointment with the [facility] please come in thru the  Main Entrance and check in at the main information desk  You need to re-schedule your appointment should you arrive 10 or more minutes late.  We strive to give you quality time with our providers, and arriving late affects you and other patients whose appointments are after yours.  Also, if you no show three or more times for appointments you may be dismissed from the clinic at the providers discretion.     Again, thank you for choosing [facility].  Our hope is that these requests will decrease the amount of time that you wait before being seen by our physicians.       _____________________________________________________________  Should you have questions after your visit to [facility], please contact our office at [phone] between the hours of 8:30 a.m. and 4:30 p.m.  Voicemails left after 4:30 p.m. will not be returned until the following business day.  For prescription refill requests, have your pharmacy contact our office.       Resources For Cancer Patients and their Caregivers ? American Cancer Society: Can assist with transportation, wigs, general needs, runs Look Good Feel Better.        (843)636-6911 ? Cancer Care: Provides financial assistance, online support groups, medication/co-pay assistance.  1-800-813-HOPE 901-857-5268) ? [facility] Assists  [place] cancer patients and their families through emotional , educational and financial support.  707 399 4414 ? [place] DSS Where to apply for food stamps, Medicaid and utility assistance. 662-851-7626 ? RCATS: Transportation to medical appointments. (727)123-1651 ? Social Security Administration: May apply for disability if have a Stage IV cancer. (628)381-4174 337-796-0374 ? [employer], Disability and Transit Services: Assists with nutrition, care and transit needs. [place] Support Programs: '@10RELATIVEDAYS'$ @ > Cancer Support Group  2nd Tuesday of the month 1pm-2pm, Journey Room  > Creative Journey  3rd Tuesday of the month 1130am-1pm, Journey Room  > Look Good Feel Better  1st Wednesday of the month 10am-12 noon, Journey Room (Call [place] to register [phone])

## 2016-08-25 ENCOUNTER — Telehealth: Payer: Self-pay | Admitting: Internal Medicine

## 2016-08-25 NOTE — Telephone Encounter (Signed)
Routing to [facility] 

## 2016-08-25 NOTE — Telephone Encounter (Signed)
Pt's wife called to say that the patient was doing better.

## 2016-08-26 ENCOUNTER — Ambulatory Visit (HOSPITAL_COMMUNITY)
Admission: RE | Admit: 2016-08-26 | Discharge: 2016-08-26 | Disposition: A | Discharge status: Home / Self Care | Payer: Medicare Other | Source: Ambulatory Visit | Attending: Oncology | Admitting: Oncology

## 2016-08-26 DIAGNOSIS — C7800 Secondary malignant neoplasm of unspecified lung: Secondary | ICD-10-CM | POA: Diagnosis not present

## 2016-08-26 DIAGNOSIS — K869 Disease of pancreas, unspecified: Secondary | ICD-10-CM | POA: Diagnosis not present

## 2016-08-26 DIAGNOSIS — C189 Malignant neoplasm of colon, unspecified: Secondary | ICD-10-CM | POA: Diagnosis not present

## 2016-08-26 DIAGNOSIS — I700 Atherosclerosis of aorta: Secondary | ICD-10-CM | POA: Insufficient documentation

## 2016-08-26 DIAGNOSIS — K388 Other specified diseases of appendix: Secondary | ICD-10-CM | POA: Diagnosis not present

## 2016-08-26 DIAGNOSIS — C20 Malignant neoplasm of rectum: Secondary | ICD-10-CM | POA: Diagnosis not present

## 2016-08-26 DIAGNOSIS — C3490 Malignant neoplasm of unspecified part of unspecified bronchus or lung: Secondary | ICD-10-CM | POA: Diagnosis not present

## 2016-08-26 DIAGNOSIS — I2510 Atherosclerotic heart disease of native coronary artery without angina pectoris: Secondary | ICD-10-CM | POA: Diagnosis not present

## 2016-08-26 MED ORDER — IOPAMIDOL (ISOVUE-300) INJECTION 61%
100.0000 mL | Freq: Once | INTRAVENOUS | Status: AC | PRN
  Administered 2016-08-26: 100 mL via INTRAVENOUS

## 2016-08-27 ENCOUNTER — Encounter: Payer: Self-pay | Admitting: Internal Medicine

## 2016-08-27 NOTE — Telephone Encounter (Signed)
OV MADE WITH RMR AND LETTER SENT TO PATIENT

## 2016-08-27 NOTE — Telephone Encounter (Addendum)
Noted. Can we have patient come back in to see RMR in 3 weeks, may or may not need updated tcs.

## 2016-08-27 NOTE — Telephone Encounter (Signed)
Please schedule ov.  

## 2016-09-02 ENCOUNTER — Encounter (HOSPITAL_COMMUNITY): Payer: Self-pay

## 2016-09-02 ENCOUNTER — Encounter (HOSPITAL_BASED_OUTPATIENT_CLINIC_OR_DEPARTMENT_OTHER): Payer: Medicare Other

## 2016-09-02 ENCOUNTER — Encounter (HOSPITAL_COMMUNITY): Payer: Medicare Other | Attending: Oncology | Admitting: Oncology

## 2016-09-02 VITALS — BP 152/93 | HR 79 | Temp 97.8°F | Resp 18 | Wt 208.8 lb

## 2016-09-02 DIAGNOSIS — C7889 Secondary malignant neoplasm of other digestive organs: Secondary | ICD-10-CM | POA: Diagnosis not present

## 2016-09-02 DIAGNOSIS — Z7901 Long term (current) use of anticoagulants: Secondary | ICD-10-CM | POA: Diagnosis not present

## 2016-09-02 DIAGNOSIS — G4700 Insomnia, unspecified: Secondary | ICD-10-CM | POA: Diagnosis not present

## 2016-09-02 DIAGNOSIS — C20 Malignant neoplasm of rectum: Secondary | ICD-10-CM | POA: Diagnosis not present

## 2016-09-02 DIAGNOSIS — Z5112 Encounter for antineoplastic immunotherapy: Secondary | ICD-10-CM

## 2016-09-02 DIAGNOSIS — Z86718 Personal history of other venous thrombosis and embolism: Secondary | ICD-10-CM

## 2016-09-02 DIAGNOSIS — C782 Secondary malignant neoplasm of pleura: Secondary | ICD-10-CM

## 2016-09-02 DIAGNOSIS — Z5111 Encounter for antineoplastic chemotherapy: Secondary | ICD-10-CM

## 2016-09-02 LAB — CBC WITH DIFFERENTIAL/PLATELET
Basophils Absolute: 0 10*3/uL (ref 0.0–0.1)
Basophils Relative: 1 %
Eosinophils Absolute: 0.1 10*3/uL (ref 0.0–0.7)
Eosinophils Relative: 2 %
HCT: 34.7 % — ABNORMAL LOW (ref 39.0–52.0)
Hemoglobin: 11.7 g/dL — ABNORMAL LOW (ref 13.0–17.0)
Lymphocytes Relative: 40 %
Lymphs Abs: 1.7 10*3/uL (ref 0.7–4.0)
MCH: 31 pg (ref 26.0–34.0)
MCHC: 33.7 g/dL (ref 30.0–36.0)
MCV: 91.8 fL (ref 78.0–100.0)
Monocytes Absolute: 0.6 10*3/uL (ref 0.1–1.0)
Monocytes Relative: 13 %
Neutro Abs: 1.9 10*3/uL (ref 1.7–7.7)
Neutrophils Relative %: 44 %
Platelets: 125 10*3/uL — ABNORMAL LOW (ref 150–400)
RBC: 3.78 MIL/uL — ABNORMAL LOW (ref 4.22–5.81)
RDW: 15.6 % — ABNORMAL HIGH (ref 11.5–15.5)
WBC: 4.3 10*3/uL (ref 4.0–10.5)

## 2016-09-02 LAB — COMPREHENSIVE METABOLIC PANEL
ALT: 27 U/L (ref 17–63)
AST: 29 U/L (ref 15–41)
Albumin: 3.2 g/dL — ABNORMAL LOW (ref 3.5–5.0)
Alkaline Phosphatase: 75 U/L (ref 38–126)
Anion gap: 8 (ref 5–15)
BUN: 9 mg/dL (ref 6–20)
CO2: 23 mmol/L (ref 22–32)
Calcium: 8.1 mg/dL — ABNORMAL LOW (ref 8.9–10.3)
Chloride: 107 mmol/L (ref 101–111)
Creatinine, Ser: 0.72 mg/dL (ref 0.61–1.24)
GFR calc Af Amer: >60 mL/min (ref >60)
GFR calc non Af Amer: >60 mL/min (ref >60)
Glucose, Bld: 216 mg/dL — ABNORMAL HIGH (ref 65–99)
Potassium: 3.6 mmol/L (ref 3.5–5.1)
Sodium: 138 mmol/L (ref 135–145)
Total Bilirubin: 1 mg/dL (ref 0.3–1.2)
Total Protein: 5.4 g/dL — ABNORMAL LOW (ref 6.5–8.1)

## 2016-09-02 LAB — URINALYSIS, DIPSTICK ONLY
Bilirubin Urine: NEGATIVE
Glucose, UA: 250 mg/dL — AB
Hgb urine dipstick: NEGATIVE
Ketones, ur: NEGATIVE mg/dL
Leukocytes, UA: NEGATIVE
Nitrite: NEGATIVE
Protein, ur: NEGATIVE mg/dL
Specific Gravity, Urine: 1.025 (ref 1.005–1.030)
pH: 6 (ref 5.0–8.0)

## 2016-09-02 MED ORDER — LEUCOVORIN CALCIUM INJECTION 350 MG
400.0000 mg/m2 | Freq: Once | INTRAVENOUS | Status: AC
  Administered 2016-09-02: 880 mg via INTRAVENOUS
  Filled 2016-09-02: qty 44

## 2016-09-02 MED ORDER — OXALIPLATIN CHEMO INJECTION 100 MG/20ML
85.0000 mg/m2 | Freq: Once | INTRAVENOUS | Status: AC
  Administered 2016-09-02: 185 mg via INTRAVENOUS
  Filled 2016-09-02: qty 37

## 2016-09-02 MED ORDER — SODIUM CHLORIDE 0.9 % IV SOLN
5.0000 mg/kg | Freq: Once | INTRAVENOUS | Status: AC
  Administered 2016-09-02: 475 mg via INTRAVENOUS
  Filled 2016-09-02: qty 3

## 2016-09-02 MED ORDER — FLUOROURACIL CHEMO INJECTION 5 GM/100ML
2400.0000 mg/m2 | INTRAVENOUS | Status: DC
  Administered 2016-09-02: 5300 mg via INTRAVENOUS
  Filled 2016-09-02: qty 106

## 2016-09-02 MED ORDER — CLONAZEPAM 1 MG PO TABS: 1.0000 mg | ORAL_TABLET | Freq: Two times a day (BID) | ORAL | 0 refills | Status: DC | PRN

## 2016-09-02 MED ORDER — SODIUM CHLORIDE 0.9 % IV SOLN
INTRAVENOUS | Status: DC
  Administered 2016-09-02: 10:00:00 via INTRAVENOUS

## 2016-09-02 MED ORDER — FLUOROURACIL CHEMO INJECTION 2.5 GM/50ML
400.0000 mg/m2 | Freq: Once | INTRAVENOUS | Status: AC
  Administered 2016-09-02: 900 mg via INTRAVENOUS
  Filled 2016-09-02: qty 18

## 2016-09-02 MED ORDER — DIPHENHYDRAMINE HCL 50 MG/ML IJ SOLN
50.0000 mg | Freq: Once | INTRAMUSCULAR | Status: AC
  Administered 2016-09-02: 50 mg via INTRAVENOUS
  Filled 2016-09-02: qty 1

## 2016-09-02 MED ORDER — DEXTROSE 5 % IV SOLN
Freq: Once | INTRAVENOUS | Status: AC
  Administered 2016-09-02: 11:00:00 via INTRAVENOUS

## 2016-09-02 MED ORDER — PALONOSETRON HCL INJECTION 0.25 MG/5ML
0.2500 mg | Freq: Once | INTRAVENOUS | Status: AC
  Administered 2016-09-02: 0.25 mg via INTRAVENOUS
  Filled 2016-09-02: qty 5

## 2016-09-02 MED ORDER — SODIUM CHLORIDE 0.9 % IV SOLN: 10.0000 mg | Freq: Once | INTRAVENOUS | Status: DC

## 2016-09-02 MED ORDER — DEXAMETHASONE SODIUM PHOSPHATE 10 MG/ML IJ SOLN
10.0000 mg | Freq: Once | INTRAMUSCULAR | Status: AC
  Administered 2016-09-02: 10 mg via INTRAVENOUS
  Filled 2016-09-02: qty 1

## 2016-09-02 NOTE — Progress Notes (Signed)
Tolerated chemo well. Continuous infusion pump intact. Stable and ambulatory on discharge home with wife. 

## 2016-09-02 NOTE — Progress Notes (Signed)
PROGRESS NOTE   [name], MD [place] [address]  History of Stage III cancer of the rectum status post preoperative radiation and chemotherapy followed by definitive surgery. He then underwent adjuvant chemotherapy with FOLFOX. During the radiation therapy and continuous infusion 5-FU from 09/09/2010 through 10/10/2010. FOLFOX was administered from October 2012 through 07/14/2011.  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      11/14/2010 Surgery    S/P proctectomy with colorectal anastomosis and diverting loop ileostomy on 11/14/10 at [facility] by Dr. Harlon Ditty. Pathology reveals a pT3b N1 with 3/20 lymph nodes.      02/05/2011 - 07/14/2011 Chemotherapy    FOLFOX      08/18/2011 Surgery    Approximate date of surgery- [place] by Dr. Harlon Ditty       Remission         11/24/2013 Survivorship    Colonoscopy- somewhat fibrotic/friable anastomotic mucosal-status post biopsy (narrowing not felt to be clinically significant).  Negative pathology for malignancy      09/12/2014 Imaging    DVT in the left femoral venous system, left common iliac vein, IVC, and within the IVC filter Right lower extremity venography confirms chronic occlusion of the femoral venous system with collateralization. The right iliac venous system is patent and do      09/25/2014 PET scan    The right middle lobe pulmonary nodule is hypermetabolic, favored to represent a primary bronchogenic carcinoma.Equivocal mediastinal nodes, similar to surrounding blood pool. Bilateral adrenal hypermetabolism, felt to be physiologic      10/24/2014 Pathology Results    Lung, needle/core  biopsy(ies), RML - ADENOCARCINOMA, SEE COMMENT metastatic adenocarcinoma of a colorectal primary      10/24/2014 Relapse/Recurrence         11/16/2014 Definitive Surgery    Bronchoscopy, right video-assisted thoracoscopy, wedge resection of right middle lobe by Dr. Servando Snare      11/16/2014 Pathology Results    Lung, wedge biopsy/resection, right lingula and small portion of middle lobe - METASTATIC ADENOCARCINOMA, CONSISTENT WITH COLORECTAL PRIMARY, SPANNING 2.0 CM. - THE SURGICAL RESECTION MARGINS ARE NEGATIVE FOR ADENOCARCINOMA.      11/16/2014 Remission    THE SURGICAL RESECTION MARGINS ARE NEGATIVE FOR ADENOCARCINOMA.      03/07/2015 Imaging    CT CAP- Interval resection of right middle lobe metastasis. No acute process or evidence of metastatic disease in the chest, abdomen or pelvis. Improved right upper lobe reticular nodular opacities are favored to represent resolving infection.      09/14/2015 Imaging    CT CAP- No findings of recurrent malignancy. No recurrence along the wedge resection site of the right middle lobe. Right anterior abdominal wall focal hernia containing a knuckle of small bowel without complicating feature.      06/13/2016 Imaging    CT CAP- 1. New right-sided pleural metastasis/mass. Other smaller right-sided pulmonary nodules which are all pleural-based and most likely represent pleural metastasis. 2. No convincing evidence of abdominopelvic nodal metastasis. 3. Constellation of findings, including pancreatic atrophy, duct dilatation, and pancreatic head soft tissue fullness which are highly suspicious for pancreatic adenocarcinoma. Metastatic disease felt much less likely.  Consider endoscopic ultrasound sampling or ERCP. Cannot exclude superimposed acute pancreatitis. 4. New enlargement of the appendiceal tip with subtle surrounding edema. Cannot exclude early or mild appendicitis. 5. Coronary artery atherosclerosis. Aortic atherosclerosis. 6. Partial  anomalous pulmonary venous return from the left upper lobe.      06/13/2016 Progression    CT scan demonstrates progression of disease      06/19/2016 Procedure    EUS with FNA by Dr. Ardis Hughs      06/20/2016 Pathology Results    FINE NEEDLE ASPIRATION, ENDOSCOPIC, PANCREAS UNCINATE AREA(SPECIMEN 1 OF 1 COLLECTED 06/19/16): MALIGNANT CELLS CONSISTENT WITH METASTATIC ADENOCARCINOMA.      06/25/2016 PET scan    1. Two pleural-based nodules in the right hemithorax are hypermetabolic. Metastatic disease is a distinct consideration. The scattered pulmonary parenchymal nodule seen on previous diagnostic CT imaging are below the threshold for reliable resolution on PET imaging. 2. Hypermetabolic lesion pancreatic head, consistent with neoplasm. As noted on prior CT, adenocarcinoma is any consideration. No evidence for hypermetabolic abdominal lymphadenopathy. Mottled uptake noted in the liver, but no discrete hepatic metastases are evident on PET imaging. 3. Appendix remains distended up to 09-10 mm diameter with a stone towards the tip in subtle periappendiceal edema/inflammation. The appendix is hypermetabolic along its length. Imaging features are relatively stable in the 12 day interval since prior CT scan. While appendicitis is a consideration, the relative stability over 12 days would be unusual for that etiology. Continued close follow-up recommended.      06/26/2016 Pathology Results    Not enough tissue for foundationONE or K-ras testing.      07/07/2016 Procedure    Port placed by Dr. Arnoldo Morale      08/27/2016 Imaging    CT CAP- 1. Pulmonary metastatic disease is stable. 2. Pancreatic head mass, grossly stable, with progressive atrophy of the body and tail of the pancreas. Stable peripancreatic lymph node. 3. Mild circumferential rectal wall thickening, stable. 4. Aortic atherosclerosis (ICD10-170.0). Coronary artery calcification. 5. Hyperattenuating lesion off the lower  pole left kidney, stable, too small to characterize. Continued attention on followup exams is warranted. 6. Persistent wall thickening and mild dilatation of the proximal appendix, of uncertain etiology. Continued attention on followup exams is warranted.       INTERVAL HISTORY: Charles [name] 66 y.o. male returns for followup of stage IV CRC.   He is accompanied today by his wife. I personally reviewed and went over scans with the patient. He is tolerating his treatment well.   He states he has insomnia. He notes he takes a "sugar pill and sleeping pill" together and it doesn't seem to help.   He notes he had one episode of diarrhea which was controlled with imodium. He denies any reoccurrence of diarrhea outside of that one episode. He sees Dr.Rourk for his anal fissure on the 29th of this month. He has received several creams for it that he is currently using. He reports it is improving.   He notes that he gained 5 pounds since his last visit. His appetite is good.  His energy level is good. 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 His wife notes that "he still works even if he is tired."  His wife states that he wakes up in the middle of the night with drenching night sweats.   Denies abdominal pain. Patient has no other questions or concerns at this time.    Past Medical History:  Diagnosis  Date  . Chronic anticoagulation   . Colon cancer ([place]) 07/24/2010   rectal ca, inv adenocarcinoma  . Depression 04/01/2011  . Diabetes mellitus without complication ([place])   . DVT (deep venous thrombosis) ([place]) 05/09/2011  . GERD (gastroesophageal reflux disease)   . High output ileostomy ([place]) 05/21/2011  . History of kidney stones   . HTN (hypertension)   . Hx of radiation therapy 09/02/10 to 10/14/10   pelvis  . Lung metastasis ([place])   . Neuropathy   . Peripheral vascular disease ([place])    dvt's,pe  . Pneumonia    hx x3  . Pulmonary embolism ([place])   .  Rectal cancer () 01/15/2011   S/P radiation and concurrent 5-FU continuous infusion from 09/09/10- 10/10/10.  S/P proctectomy with colorectal anastomosis and diverting loop ileostomy on 11/14/10 at [facility] by Dr. Harlon Ditty. Pathology reveals a pT3b N1 with 3/20 lymph nodes.      has GERD; Rectal cancer ([place]); Hx of radiation therapy; Stiffness of joint, not elsewhere classified, ankle and foot; Weakness of both legs; Peripheral neuropathy due to oxaliplatin-chemotherapy; Diarrhea; Acute pulmonary embolism ([place]); Lactic acidosis; Lower abdominal pain; Urinary retention; Lung nodule, solitary; DVT (deep venous thrombosis) ([place]); Lung nodule; S/P partial lobectomy of lung; GI bleed; Chronic anticoagulation; Constipation; Heme positive stool; Gastritis; S/P thoracotomy; History of rectal cancer; Hiatal hernia; Schatzki's ring; Rectal pain; Loss of weight; Abnormal pancreas function test; Community acquired pneumonia of right lower lobe of lung ([place]); and Colon cancer ([place]) on his problem list.     is allergic to oxycodone; tramadol; and trazodone and nefazodone.  Mr. Juhasz had no medications administered during this visit.  Past Surgical History:  Procedure Laterality Date  . COLON SURGERY  11/14/2010   proctectomy with colorectal anastomosis and diverting loop ileostomy (temporary planned)  . COLONOSCOPY  07/2010   proximal rectal apple core mass 10-14cm from anal verge (adenocarcinoma), 2-3cm distal rectal carpet polyp s/p piecemeal snare polypectomy (adenoma)  . COLONOSCOPY  04/21/2012   RMR: Friable,fibrotic appearing colorectal anastomosis producing some luminal narrowing-not felt to be critical. path: focal erosion with slight inflammation and hyperemia. SURVEILLANCE DUE DEC 2015  . COLONOSCOPY N/A 11/24/2013   Dr. Rourk:somewhat fibrotic/friable anastomotic mucosal-status post biopsy (narrowing not felt to be clinically significant) Single colonic diverticulum. benign polypoid rectal mucosa   . colostomy reversal  april 2013  . ESOPHAGOGASTRODUODENOSCOPY  07/2010   RMR: schatki ring s/p dilation, small hh, SB bx benign  . ESOPHAGOGASTRODUODENOSCOPY N/A 09/04/2015   Procedure: ESOPHAGOGASTRODUODENOSCOPY (EGD);  Surgeon: Daneil Dolin, MD;  Location: [facility];  Service: Endoscopy;  Laterality: N/A;  730  . EUS  08/2010   Dr. Owens Loffler. uT3N0 circumferential, nearly obstruction rectosigmoid adenocarcinoma, distal edge 12cm from anal verge  . EUS N/A 06/19/2016   Procedure: UPPER ENDOSCOPIC ULTRASOUND (EUS) RADIAL;  Surgeon: Milus Banister, MD;  Location: [facility] ENDOS[facility];  Service: Endoscopy;  Laterality: N/A;  . HERNIA REPAIR     abd hernia repair  . IVC filter    . ivc filter    . port a cath placement    . PORT-A-CATH REMOVAL  09/24/2011   Procedure: REMOVAL PORT-A-CATH;  Surgeon: Donato Heinz, MD;  Location: [facility] ORS;  Service: General;  Laterality: N/A;  Minor Room  . PORTACATH PLACEMENT Right 07/07/2016   Procedure: INSERTION PORT-A-CATH;  Surgeon: Aviva Signs, MD;  Location: [facility] ORS;  Service: General;  Laterality: Right;  Marland Kitchen VIDEO ASSISTED THORACOSCOPY (VATS)/WEDGE RESECTION Right 11/15/2014  Procedure: VIDEO ASSISTED THORACOSCOPY (VATS)/LUNG RESECTION WITH RIGHT LINGULECTOMY;  Surgeon: [name], MD;  Location: [facility];  Service: Thoracic;  Laterality: Right;  Marland Kitchen VIDEO BRONCHOSCOPY N/A 11/15/2014   Procedure: VIDEO BRONCHOSCOPY;  Surgeon: [name], MD;  Location: [facility];  Service: Thoracic;  Laterality: N/A;   Review of Systems  Constitutional: Negative.        + drenching night sweats   HENT: Negative.   Eyes: Negative.   Respiratory: Negative.   Cardiovascular: Negative.   Gastrointestinal: Positive for diarrhea (controlled with immodium ). Negative for abdominal pain.  Genitourinary: Negative.   Musculoskeletal: Negative.   Skin: Negative.   Neurological: Negative.   Endo/Heme/Allergies: Negative.   Psychiatric/Behavioral: The patient has  insomnia.   All other systems reviewed and are negative. 14 point review of systems was performed and is negative except as detailed under history of present illness and above  PHYSICAL EXAMINATION  ECOG PERFORMANCE STATUS: 0 - Asymptomatic   Physical Exam  Constitutional: He is oriented to person, place, and time and well-developed, well-nourished, and in no distress.  HENT:  Head: Normocephalic and atraumatic.  Nose: Nose normal.  Mouth/Throat: Oropharynx is clear and moist. No oropharyngeal exudate.  Eyes: Conjunctivae and EOM are normal. Pupils are equal, round, and reactive to light. Right eye exhibits no discharge. Left eye exhibits no discharge. No scleral icterus.  Neck: Normal range of motion. Neck supple. No tracheal deviation present. No thyromegaly present.  Cardiovascular: Normal rate, regular rhythm and normal heart sounds.  Exam reveals no gallop and no friction rub.   No murmur heard. Pulmonary/Chest: Effort normal and breath sounds normal. He has no wheezes. He has no rales.  Abdominal: Soft. Bowel sounds are normal. He exhibits no distension and no mass. There is no tenderness. There is no rebound and no guarding.  Musculoskeletal: Normal range of motion. He exhibits no edema.  Lymphadenopathy:    He has no cervical adenopathy.  Neurological: He is alert and oriented to person, place, and time. He has normal reflexes. No cranial nerve deficit. Gait normal. Coordination normal.  Skin: Skin is warm and dry. No rash noted.  Psychiatric: Mood, memory, affect and judgment normal.  Nursing note and vitals reviewed.  LABORATORY DATA: I have reviewed the data as listed.  CBC Latest Ref Rng & Units 08/19/2016 08/05/2016 07/23/2016  WBC 4.0 - 10.5 K/uL 5.3 4.5 7.9  Hemoglobin 13.0 - 17.0 g/dL 12.8(L) 11.8(L) 13.5  Hematocrit 39.0 - 52.0 % 38.2(L) 34.2(L) 38.3(L)  Platelets 150 - 400 K/uL 153 170 163   CMP Latest Ref Rng & Units 08/19/2016 08/05/2016 07/24/2016  Glucose 65 - 99  mg/dL 201(H) 179(H) 227(H)  BUN 6 - 20 mg/dL '12 9 13  ' Creatinine 0.61 - 1.24 mg/dL 0.76 0.67 0.70  Sodium 135 - 145 mmol/L 137 140 130(L)  Potassium 3.5 - 5.1 mmol/L 3.9 3.2(L) 3.9  Chloride 101 - 111 mmol/L 109 110 100(L)  CO2 22 - 32 mmol/L 21(L) 25 22  Calcium 8.9 - 10.3 mg/dL 8.4(L) 8.2(L) 8.2(L)  Total Protein 6.5 - 8.1 g/dL 6.0(L) 5.6(L) -  Total Bilirubin 0.3 - 1.2 mg/dL 1.4(H) 1.2 -  Alkaline Phos 38 - 126 U/L 98 75 -  AST 15 - 41 U/L 29 23 -  ALT 17 - 63 U/L 33 18 -   PATHOLOGY:   RADIOLOGY: I have personally reviewed the radiological images as listed and agreed with the findings in the report.  Initial PET 06/25/2016 IMPRESSION:  1. Two pleural-based nodules in the right hemithorax are hypermetabolic. Metastatic disease is a distinct consideration. The scattered pulmonary parenchymal nodule seen on previous diagnostic CT imaging are below the threshold for reliable resolution on PET imaging. 2. Hypermetabolic lesion pancreatic head, consistent with neoplasm. As noted on prior CT, adenocarcinoma is any consideration. No evidence for hypermetabolic abdominal lymphadenopathy. Mottled uptake noted in the liver, but no discrete hepatic metastases are evident on PET imaging. 3. Appendix remains distended up to 09-10 mm diameter with a stone towards the tip in subtle periappendiceal edema/inflammation. The appendix is hypermetabolic along its length. Imaging features are relatively stable in the 12 day interval since prior CT scan. While appendicitis is a consideration, the relative stability over 12 days would be unusual for that etiology. Continued close follow-up recommended.  CT Head wo Contrast 06/17/2016 IMPRESSION: Normal exam except for atherosclerotic calcification of the major vessels at the base of the brain.   CT CHEST, ABDOMEN, AND PELVIS WITH CONTRAST 06/13/2016 IMPRESSION: 1. New right-sided pleural metastasis/mass. Other smaller right-sided pulmonary  nodules which are all pleural-based and most likely represent pleural metastasis. 2. No convincing evidence of abdominopelvic nodal metastasis. 3. Constellation of findings, including pancreatic atrophy, duct dilatation, and pancreatic head soft tissue fullness which are highly suspicious for pancreatic adenocarcinoma. Metastatic disease felt much less likely. Consider endoscopic ultrasound sampling or ERCP. Cannot exclude superimposed acute pancreatitis. 4. New enlargement of the appendiceal tip with subtle surrounding edema. Cannot exclude early or mild appendicitis. 5.  Coronary artery atherosclerosis. Aortic atherosclerosis. 6. 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  CT HEAD WITHOUT CONTRAST 06/17/2016  IMPRESSION: Normal exam except for atherosclerotic calcification of the major vessels at the base of the brain.  NUCLEAR MEDICINE PET SKULL BASE TO THIGH 06/25/2016  IMPRESSION: 1. Two pleural-based nodules in the right hemithorax are hypermetabolic. Metastatic disease is a distinct consideration. The scattered pulmonary parenchymal nodule seen on previous diagnostic CT imaging are below the threshold for reliable resolution on PET imaging. 2. Hypermetabolic lesion pancreatic head, consistent with neoplasm. As noted on prior CT, adenocarcinoma is any consideration. No evidence for hypermetabolic abdominal lymphadenopathy. Mottled uptake noted in the liver, but no discrete hepatic metastases are evident on PET imaging. 3. Appendix remains distended up to 09-10 mm diameter with a stone towards the tip in subtle periappendiceal edema/inflammation. The appendix is hypermetabolic along its length. Imaging features are relatively stable in the 12 day interval since prior CT scan. While appendicitis is a consideration, the relative stability over 12 days would be unusual for that etiology. Continued close follow-up recommended.  PORTABLE CHEST 1 VIEW  07/07/2016  IMPRESSION: Changes of known metastatic disease as well as new port placement. No pneumothorax is noted.  PORTABLE CHEST 1 VIEW 07/23/2016  IMPRESSION: Mild right basilar airspace opacity raises concern for pneumonia.  CT CHEST, ABDOMEN, AND PELVIS WITH CONTRAST 08/27/2016  IMPRESSION: 1. Pulmonary metastatic disease is stable. 2. Pancreatic head mass, grossly stable, with progressive atrophy of the body and tail of the pancreas. Stable peripancreatic lymph node. 3. Mild circumferential rectal wall thickening, stable. 4. Aortic atherosclerosis (ICD10-170.0). Coronary artery calcification. 5. Hyperattenuating lesion off the lower pole left kidney, stable, too small to characterize. Continued attention on followup exams is warranted. 6. Persistent wall thickening and mild dilatation of the proximal appendix, of uncertain etiology. Continued attention on followup exams is warranted.   ASSESSMENT AND PLAN:  Stage IV CRC s/p surgical resection of solitary pulmonary nodule on 11/16/2014 Bronchoscopy,  R VATS, wedge resection RML 11/16/2014 with Dr. Servando Snare History of Stage III CRC Treatment related neuropathy History of IVC filter in 2012 Acute DVT/PE 09/2014, s/p catheter lysis of DVT XARELTO Anxiety on Lexapro GERD  CT scans reviewed with the patient and his wife. Pancreatic mass and pulmonary mets are stable. Previous right-sided pleural mass/metastasis measures 3.4 x 1.4 cm seen on CT chest on 06/13/16 is not seen on repeat scans this time.  I will increase his Klonopin dose to 1 mg to take at night for his insomnia. I told him that if he does not notice any difference in his insomnia in 2 weeks  to let me know.   Continue chemotherapy with FOLFOX and avastin q2weeks. Proceed with cycle 6 today.  RTC in 4 weeks for follow up.    All questions were answered. The patient knows to call the clinic with any problems, questions or concerns. We can certainly see the patient  much sooner if necessary.   This document serves as a record of services personally performed by Twana First, MD. It was created on her behalf by Shirlean Mylar, a trained medical scribe. The creation of this record is based on the scribe's personal observations and the provider's statements to them. This document has been checked and approved by the attending provider.   I have reviewed the above documentation for accuracy and completeness, and I agree with the above.  This note was electronically signed.

## 2016-09-02 NOTE — Patient Instructions (Signed)
[facility] Discharge Instructions for Patients Receiving Chemotherapy   Beginning January 23rd 2017 lab work for the [facility] will be done in the  Main lab at [facility] on 1st floor. If you have a lab appointment with the [facility] please come in thru the  Main Entrance and check in at the main information desk   Today you received the following chemotherapy agents avastin, oxaliplatin, leucovorin and 39f pump.  If you develop nausea and vomiting, or diarrhea that is not controlled by your medication, call the clinic.  The clinic phone number is [phone] Office hours are Monday-Friday 8:30am-5:00pm.  BELOW ARE SYMPTOMS THAT SHOULD BE REPORTED IMMEDIATELY:  *FEVER GREATER THAN 101.0 F  *CHILLS WITH OR WITHOUT FEVER  NAUSEA AND VOMITING THAT IS NOT CONTROLLED WITH YOUR NAUSEA MEDICATION  *UNUSUAL SHORTNESS OF BREATH  *UNUSUAL BRUISING OR BLEEDING  TENDERNESS IN MOUTH AND THROAT WITH OR WITHOUT PRESENCE OF ULCERS  *URINARY PROBLEMS  *BOWEL PROBLEMS  UNUSUAL RASH Items with * indicate a potential emergency and should be followed up as soon as possible. If you have an emergency after office hours please contact your primary care physician or go to the nearest emergency department.  Please call the clinic during office hours if you have any questions or concerns.   You may also contact the Patient Navigator at ([phone] you have any questions or need assistance in obtaining follow up care.      Resources For Cancer Patients and their Caregivers ? American Cancer Society: Can assist with transportation, wigs, general needs, runs Look Good Feel Better.        1857-573-8188? Cancer Care: Provides financial assistance, online support groups, medication/co-pay assistance.  1-800-813-HOPE ((220)318-3940 ? [facility] [employer] cancer patients and their families through emotional , educational and  financial support.  34147667070? [place] DSS Where to apply for food stamps, Medicaid and utility assistance. 3765-027-5760? [facility]: Transportation to medical appointments. 3810 277 4626? Social Security Administration: May apply for disability if have a Stage IV cancer. 3706-443-52771(732)293-3858? [employer]: Assists with nutrition, care and transit needs. 3517-060-9875

## 2016-09-02 NOTE — Patient Instructions (Addendum)
[place] at [facility] Discharge Instructions  RECOMMENDATIONS MADE BY THE CONSULTANT AND ANY TEST RESULTS WILL BE SENT TO YOUR REFERRING PHYSICIAN.  You were seen today by Dr. Talbert Cage. Return in 4 weeks for follow up.     Thank you for choosing [place] at [facility] to provide your oncology and hematology care.  To afford each patient quality time with our provider, please arrive at least 15 minutes before your scheduled appointment time.    If you have a lab appointment with the [facility] please come in thru the  Main Entrance and check in at the main information desk  You need to re-schedule your appointment should you arrive 10 or more minutes late.  We strive to give you quality time with our providers, and arriving late affects you and other patients whose appointments are after yours.  Also, if you no show three or more times for appointments you may be dismissed from the clinic at the providers discretion.     Again, thank you for choosing [facility].  Our hope is that these requests will decrease the amount of time that you wait before being seen by our physicians.       _____________________________________________________________  Should you have questions after your visit to [facility], please contact our office at [phone] between the hours of 8:30 a.m. and 4:30 p.m.  Voicemails left after 4:30 p.m. will not be returned until the following business day.  For prescription refill requests, have your pharmacy contact our office.       Resources For Cancer Patients and their Caregivers ? American Cancer Society: Can assist with transportation, wigs, general needs, runs Look Good Feel Better.        7028134671 ? Cancer Care: Provides financial assistance, online support groups, medication/co-pay assistance.  1-800-813-HOPE 212 070 3229) ? [place] Assists [place] cancer patients and their families through emotional , educational and financial support.  320-630-5968 ? [place] DSS Where to apply for food stamps, Medicaid and utility assistance. 516-368-8236 ? RCATS: Transportation to medical appointments. 248 007 3281 ? Social Security Administration: May apply for disability if have a Stage IV cancer. (610)469-0992 309-127-8820 ? [employer], Disability and Transit Services: Assists with nutrition, care and transit needs. [place] Support Programs: '@10RELATIVEDAYS'$ @ > Cancer Support Group  2nd Tuesday of the month 1pm-2pm, Journey Room  > Creative Journey  3rd Tuesday of the month 1130am-1pm, Journey Room  > Look Good Feel Better  1st Wednesday of the month 10am-12 noon, Journey Room (Call [place] to register [phone])

## 2016-09-03 LAB — CEA: CEA: 2.1 ng/mL (ref 0.0–4.7)

## 2016-09-04 ENCOUNTER — Encounter (HOSPITAL_COMMUNITY): Payer: Self-pay

## 2016-09-04 ENCOUNTER — Encounter (HOSPITAL_BASED_OUTPATIENT_CLINIC_OR_DEPARTMENT_OTHER): Payer: Medicare Other

## 2016-09-04 VITALS — BP 145/82 | HR 77 | Temp 98.2°F | Resp 18

## 2016-09-04 DIAGNOSIS — Z452 Encounter for adjustment and management of vascular access device: Secondary | ICD-10-CM | POA: Diagnosis not present

## 2016-09-04 DIAGNOSIS — C20 Malignant neoplasm of rectum: Secondary | ICD-10-CM | POA: Diagnosis not present

## 2016-09-04 MED ORDER — SODIUM CHLORIDE 0.9% FLUSH
10.0000 mL | INTRAVENOUS | Status: DC | PRN
  Administered 2016-09-04: 10 mL
  Filled 2016-09-04: qty 10

## 2016-09-04 MED ORDER — HEPARIN SOD (PORK) LOCK FLUSH 100 UNIT/ML IV SOLN
500.0000 [IU] | Freq: Once | INTRAVENOUS | Status: AC | PRN
  Administered 2016-09-04: 500 [IU]
  Filled 2016-09-04: qty 5

## 2016-09-04 NOTE — Progress Notes (Signed)
Charles [name] presents to have home infusion pump d/c'd and for port-a-cath deaccess with flush.  Portacath located right chest wall accessed with  H 20 needle.  Good blood return present. Portacath flushed with NS and 500U/5ml Heparin, and needle removed intact.  Procedure tolerated well and without incident. Discharged ambulatory.  

## 2016-09-04 NOTE — Patient Instructions (Signed)
[facility] at [facility] Discharge Instructions  RECOMMENDATIONS MADE BY THE CONSULTANT AND ANY TEST RESULTS WILL BE SENT TO YOUR REFERRING PHYSICIAN.  Pump removal and port flush today. Return as scheduled.  Thank you for choosing [facility] at [facility] to provide your oncology and hematology care.  To afford each patient quality time with our provider, please arrive at least 15 minutes before your scheduled appointment time.    If you have a lab appointment with the Cancer Center please come in thru the  Main Entrance and check in at the main information desk  You need to re-schedule your appointment should you arrive 10 or more minutes late.  We strive to give you quality time with our providers, and arriving late affects you and other patients whose appointments are after yours.  Also, if you no show three or more times for appointments you may be dismissed from the clinic at the providers discretion.     Again, thank you for choosing [facility].  Our hope is that these requests will decrease the amount of time that you wait before being seen by our physicians.       _____________________________________________________________  Should you have questions after your visit to [facility], please contact our office at [phone] between the hours of 8:30 a.m. and 4:30 p.m.  Voicemails left after 4:30 p.m. will not be returned until the following business day.  For prescription refill requests, have your pharmacy contact our office.       Resources For Cancer Patients and their Caregivers ? American Cancer Society: Can assist with transportation, wigs, general needs, runs Look Good Feel Better.        1-888-227-6333 ? Cancer Care: Provides financial assistance, online support groups, medication/co-pay assistance.  1-800-813-HOPE (4673) ? [facility] Assists [place] cancer  patients and their families through emotional , educational and financial support.  336-427-4357 ? [place] DSS Where to apply for food stamps, Medicaid and utility assistance. 336-342-1394 ? RCATS: Transportation to medical appointments. 336-347-2287 ? Social Security Administration: May apply for disability if have a Stage IV cancer. 336-342-7796 1-800-772-1213 ? [place] Aging, Disability and Transit Services: Assists with nutrition, care and transit needs. 336-349-2343  Cancer Center Support Programs: @10RELATIVEDAYS@ > Cancer Support Group  2nd Tuesday of the month 1pm-2pm, Journey Room  > Creative Journey  3rd Tuesday of the month 1130am-1pm, Journey Room  > Look Good Feel Better  1st Wednesday of the month 10am-12 noon, Journey Room (Call American Cancer Society to register 1-800-395-5775)   

## 2016-09-16 ENCOUNTER — Encounter (HOSPITAL_BASED_OUTPATIENT_CLINIC_OR_DEPARTMENT_OTHER): Payer: Medicare Other

## 2016-09-16 ENCOUNTER — Other Ambulatory Visit (HOSPITAL_COMMUNITY): Payer: Self-pay | Admitting: Oncology

## 2016-09-16 VITALS — BP 153/81 | HR 71 | Temp 98.1°F | Resp 18 | Wt 208.0 lb

## 2016-09-16 DIAGNOSIS — C20 Malignant neoplasm of rectum: Secondary | ICD-10-CM | POA: Diagnosis not present

## 2016-09-16 DIAGNOSIS — Z5111 Encounter for antineoplastic chemotherapy: Secondary | ICD-10-CM | POA: Diagnosis not present

## 2016-09-16 DIAGNOSIS — Z5112 Encounter for antineoplastic immunotherapy: Secondary | ICD-10-CM | POA: Diagnosis present

## 2016-09-16 DIAGNOSIS — R195 Other fecal abnormalities: Secondary | ICD-10-CM

## 2016-09-16 LAB — CBC WITH DIFFERENTIAL/PLATELET
Basophils Absolute: 0 10*3/uL (ref 0.0–0.1)
Basophils Relative: 0 %
Eosinophils Absolute: 0.1 10*3/uL (ref 0.0–0.7)
Eosinophils Relative: 2 %
HCT: 36.6 % — ABNORMAL LOW (ref 39.0–52.0)
Hemoglobin: 12.3 g/dL — ABNORMAL LOW (ref 13.0–17.0)
Lymphocytes Relative: 37 %
Lymphs Abs: 1.9 10*3/uL (ref 0.7–4.0)
MCH: 30.8 pg (ref 26.0–34.0)
MCHC: 33.6 g/dL (ref 30.0–36.0)
MCV: 91.5 fL (ref 78.0–100.0)
Monocytes Absolute: 0.5 10*3/uL (ref 0.1–1.0)
Monocytes Relative: 10 %
Neutro Abs: 2.5 10*3/uL (ref 1.7–7.7)
Neutrophils Relative %: 51 %
Platelets: 147 10*3/uL — ABNORMAL LOW (ref 150–400)
RBC: 4 MIL/uL — ABNORMAL LOW (ref 4.22–5.81)
RDW: 15.4 % (ref 11.5–15.5)
WBC: 5 10*3/uL (ref 4.0–10.5)

## 2016-09-16 LAB — URINALYSIS, DIPSTICK ONLY
Bilirubin Urine: NEGATIVE
Glucose, UA: 100 mg/dL — AB
Hgb urine dipstick: NEGATIVE
Ketones, ur: NEGATIVE mg/dL
Leukocytes, UA: NEGATIVE
Nitrite: NEGATIVE
Protein, ur: NEGATIVE mg/dL
Specific Gravity, Urine: 1.025 (ref 1.005–1.030)
pH: 6.5 (ref 5.0–8.0)

## 2016-09-16 LAB — COMPREHENSIVE METABOLIC PANEL
ALT: 22 U/L (ref 17–63)
AST: 26 U/L (ref 15–41)
Albumin: 3.3 g/dL — ABNORMAL LOW (ref 3.5–5.0)
Alkaline Phosphatase: 73 U/L (ref 38–126)
Anion gap: 8 (ref 5–15)
BUN: 9 mg/dL (ref 6–20)
CO2: 24 mmol/L (ref 22–32)
Calcium: 8.3 mg/dL — ABNORMAL LOW (ref 8.9–10.3)
Chloride: 107 mmol/L (ref 101–111)
Creatinine, Ser: 0.67 mg/dL (ref 0.61–1.24)
GFR calc Af Amer: >60 mL/min (ref >60)
GFR calc non Af Amer: >60 mL/min (ref >60)
Glucose, Bld: 188 mg/dL — ABNORMAL HIGH (ref 65–99)
Potassium: 3.6 mmol/L (ref 3.5–5.1)
Sodium: 139 mmol/L (ref 135–145)
Total Bilirubin: 1.4 mg/dL — ABNORMAL HIGH (ref 0.3–1.2)
Total Protein: 5.7 g/dL — ABNORMAL LOW (ref 6.5–8.1)

## 2016-09-16 MED ORDER — PALONOSETRON HCL INJECTION 0.25 MG/5ML
0.2500 mg | Freq: Once | INTRAVENOUS | Status: AC
Start: 2016-09-16 — End: 2016-09-16
  Administered 2016-09-16: 0.25 mg via INTRAVENOUS
  Filled 2016-09-16: qty 5

## 2016-09-16 MED ORDER — SODIUM CHLORIDE 0.9 % IV SOLN
5.3000 mg/kg | Freq: Once | INTRAVENOUS | Status: AC
  Administered 2016-09-16: 500 mg via INTRAVENOUS
  Filled 2016-09-16: qty 16

## 2016-09-16 MED ORDER — DIPHENOXYLATE-ATROPINE 2.5-0.025 MG PO TABS: 1.0000 | ORAL_TABLET | Freq: Four times a day (QID) | ORAL | 0 refills | Status: DC | PRN

## 2016-09-16 MED ORDER — SODIUM CHLORIDE 0.9 % IV SOLN: 10.0000 mg | Freq: Once | INTRAVENOUS | Status: DC

## 2016-09-16 MED ORDER — DIPHENHYDRAMINE HCL 50 MG/ML IJ SOLN
50.0000 mg | Freq: Once | INTRAMUSCULAR | Status: AC
  Administered 2016-09-16: 50 mg via INTRAVENOUS
  Filled 2016-09-16: qty 1

## 2016-09-16 MED ORDER — LEUCOVORIN CALCIUM INJECTION 350 MG
400.0000 mg/m2 | Freq: Once | INTRAVENOUS | Status: AC
  Administered 2016-09-16: 880 mg via INTRAVENOUS
  Filled 2016-09-16: qty 44

## 2016-09-16 MED ORDER — BEVACIZUMAB CHEMO INJECTION 400 MG/16ML
5.3000 mg/kg | Freq: Once | INTRAVENOUS | Status: DC
  Filled 2016-09-16: qty 20

## 2016-09-16 MED ORDER — FLUOROURACIL CHEMO INJECTION 5 GM/100ML
2400.0000 mg/m2 | INTRAVENOUS | Status: DC
  Administered 2016-09-16: 5300 mg via INTRAVENOUS
  Filled 2016-09-16: qty 106

## 2016-09-16 MED ORDER — DEXAMETHASONE SODIUM PHOSPHATE 10 MG/ML IJ SOLN
10.0000 mg | Freq: Once | INTRAMUSCULAR | Status: AC
  Administered 2016-09-16: 10 mg via INTRAVENOUS
  Filled 2016-09-16: qty 1

## 2016-09-16 MED ORDER — FLUOROURACIL CHEMO INJECTION 2.5 GM/50ML
400.0000 mg/m2 | Freq: Once | INTRAVENOUS | Status: AC
  Administered 2016-09-16: 900 mg via INTRAVENOUS
  Filled 2016-09-16: qty 18

## 2016-09-16 MED ORDER — DEXTROSE 5 % IV SOLN
Freq: Once | INTRAVENOUS | Status: AC
  Administered 2016-09-16: 11:00:00 via INTRAVENOUS

## 2016-09-16 MED ORDER — OXALIPLATIN CHEMO INJECTION 100 MG/20ML
85.0000 mg/m2 | Freq: Once | INTRAVENOUS | Status: AC
  Administered 2016-09-16: 185 mg via INTRAVENOUS
  Filled 2016-09-16: qty 37

## 2016-09-16 MED ORDER — SODIUM CHLORIDE 0.9 % IV SOLN
INTRAVENOUS | Status: DC
  Administered 2016-09-16: 11:00:00 via INTRAVENOUS

## 2016-09-16 NOTE — Progress Notes (Signed)
Pt denies diarrhea, but states he is "constantly" going to the bathroom to have a bowel movement, and complains that it is making his anus and rectum sore, but denies any pain otherwise.  Caroleen Hamman, PA-C notified and order rec'd for Rx Lomotil.  Rx given to pt and he verbalizes understanding of how to use this medication.   Tolerated tx w/o adverse reaction.  Alert, in no distress.  VSS.  Discharged ambulatory in c/o spouse.

## 2016-09-16 NOTE — Patient Instructions (Signed)
[facility] Discharge Instructions for Patients Receiving Chemotherapy   Beginning January 23rd 2017 lab work for the [facility] will be done in the  Main lab at [facility] on 1st floor. If you have a lab appointment with the [facility] please come in thru the  Main Entrance and check in at the main information desk   Today you received the following chemotherapy agents:  Oxaliplatin, leucovorin, 6f, and Avastin  If you develop nausea and vomiting, or diarrhea that is not controlled by your medication, call the clinic.  The clinic phone number is [phone] Office hours are Monday-Friday 8:30am-5:00pm.  BELOW ARE SYMPTOMS THAT SHOULD BE REPORTED IMMEDIATELY:  *FEVER GREATER THAN 101.0 F  *CHILLS WITH OR WITHOUT FEVER  NAUSEA AND VOMITING THAT IS NOT CONTROLLED WITH YOUR NAUSEA MEDICATION  *UNUSUAL SHORTNESS OF BREATH  *UNUSUAL BRUISING OR BLEEDING  TENDERNESS IN MOUTH AND THROAT WITH OR WITHOUT PRESENCE OF ULCERS  *URINARY PROBLEMS  *BOWEL PROBLEMS  UNUSUAL RASH Items with * indicate a potential emergency and should be followed up as soon as possible. If you have an emergency after office hours please contact your primary care physician or go to the nearest emergency department.  Please call the clinic during office hours if you have any questions or concerns.   You may also contact the Patient Navigator at [phone] you have any questions or need assistance in obtaining follow up care.      Resources For Cancer Patients and their Caregivers ? American Cancer Society: Can assist with transportation, wigs, general needs, runs Look Good Feel Better.        1(501)528-1976? Cancer Care: Provides financial assistance, online support groups, medication/co-pay assistance.  1-800-813-HOPE (325-706-5519 ? [facility] [place] cancer patients and their families through emotional , educational and financial  support.  38063062788? [place] DSS Where to apply for food stamps, Medicaid and utility assistance. 3(541)791-3126? RCATS: Transportation to medical appointments. 3567 749 1197? Social Security Administration: May apply for disability if have a Stage IV cancer. 3(236)167-368215177240603? [employer] Disability and Transit Services: Assists with nutrition, care and transit needs. [phone]

## 2016-09-18 ENCOUNTER — Encounter (HOSPITAL_COMMUNITY): Payer: Self-pay

## 2016-09-18 ENCOUNTER — Encounter (HOSPITAL_BASED_OUTPATIENT_CLINIC_OR_DEPARTMENT_OTHER): Payer: Medicare Other

## 2016-09-18 VITALS — BP 162/86 | HR 71 | Temp 98.0°F | Resp 18

## 2016-09-18 DIAGNOSIS — C20 Malignant neoplasm of rectum: Secondary | ICD-10-CM

## 2016-09-18 MED ORDER — HEPARIN SOD (PORK) LOCK FLUSH 100 UNIT/ML IV SOLN
500.0000 [IU] | Freq: Once | INTRAVENOUS | Status: AC | PRN
  Administered 2016-09-18: 500 [IU]

## 2016-09-18 MED ORDER — SODIUM CHLORIDE 0.9% FLUSH
10.0000 mL | INTRAVENOUS | Status: DC | PRN
  Administered 2016-09-18: 10 mL
  Filled 2016-09-18: qty 10

## 2016-09-18 NOTE — Progress Notes (Signed)
Charles [name] presents to have home infusion pump d/c'd and for port-a-cath deaccess with flush.  Portacath located right chest wall accessed with  H 20 needle.  Good blood return present. Portacath flushed with NS and 500U/5ml Heparin, and needle removed intact.  Procedure tolerated well and without incident. Discharged ambulatory.  

## 2016-09-18 NOTE — Patient Instructions (Signed)
[facility] at [facility] Discharge Instructions  RECOMMENDATIONS MADE BY THE CONSULTANT AND ANY TEST RESULTS WILL BE SENT TO YOUR REFERRING PHYSICIAN.  Pump removal and port flush today. Return as scheduled.  Thank you for choosing [facility] at [facility] to provide your oncology and hematology care.  To afford each patient quality time with our provider, please arrive at least 15 minutes before your scheduled appointment time.    If you have a lab appointment with the Cancer Center please come in thru the  Main Entrance and check in at the main information desk  You need to re-schedule your appointment should you arrive 10 or more minutes late.  We strive to give you quality time with our providers, and arriving late affects you and other patients whose appointments are after yours.  Also, if you no show three or more times for appointments you may be dismissed from the clinic at the providers discretion.     Again, thank you for choosing [facility].  Our hope is that these requests will decrease the amount of time that you wait before being seen by our physicians.       _____________________________________________________________  Should you have questions after your visit to [facility], please contact our office at [phone] between the hours of 8:30 a.m. and 4:30 p.m.  Voicemails left after 4:30 p.m. will not be returned until the following business day.  For prescription refill requests, have your pharmacy contact our office.       Resources For Cancer Patients and their Caregivers ? American Cancer Society: Can assist with transportation, wigs, general needs, runs Look Good Feel Better.        1-888-227-6333 ? Cancer Care: Provides financial assistance, online support groups, medication/co-pay assistance.  1-800-813-HOPE (4673) ? [facility] Assists [place] cancer  patients and their families through emotional , educational and financial support.  336-427-4357 ? [place] DSS Where to apply for food stamps, Medicaid and utility assistance. 336-342-1394 ? RCATS: Transportation to medical appointments. 336-347-2287 ? Social Security Administration: May apply for disability if have a Stage IV cancer. 336-342-7796 1-800-772-1213 ? [place] Aging, Disability and Transit Services: Assists with nutrition, care and transit needs. 336-349-2343  Cancer Center Support Programs: @10RELATIVEDAYS@ > Cancer Support Group  2nd Tuesday of the month 1pm-2pm, Journey Room  > Creative Journey  3rd Tuesday of the month 1130am-1pm, Journey Room  > Look Good Feel Better  1st Wednesday of the month 10am-12 noon, Journey Room (Call American Cancer Society to register 1-800-395-5775)   

## 2016-09-25 DIAGNOSIS — K219 Gastro-esophageal reflux disease without esophagitis: Secondary | ICD-10-CM | POA: Diagnosis not present

## 2016-09-25 DIAGNOSIS — I2699 Other pulmonary embolism without acute cor pulmonale: Secondary | ICD-10-CM | POA: Diagnosis not present

## 2016-09-25 DIAGNOSIS — C189 Malignant neoplasm of colon, unspecified: Secondary | ICD-10-CM | POA: Diagnosis not present

## 2016-09-25 DIAGNOSIS — E1165 Type 2 diabetes mellitus with hyperglycemia: Secondary | ICD-10-CM | POA: Diagnosis not present

## 2016-09-25 DIAGNOSIS — I82409 Acute embolism and thrombosis of unspecified deep veins of unspecified lower extremity: Secondary | ICD-10-CM | POA: Diagnosis not present

## 2016-09-25 DIAGNOSIS — C20 Malignant neoplasm of rectum: Secondary | ICD-10-CM | POA: Diagnosis not present

## 2016-09-25 DIAGNOSIS — Z299 Encounter for prophylactic measures, unspecified: Secondary | ICD-10-CM | POA: Diagnosis not present

## 2016-09-25 DIAGNOSIS — C3490 Malignant neoplasm of unspecified part of unspecified bronchus or lung: Secondary | ICD-10-CM | POA: Diagnosis not present

## 2016-09-25 DIAGNOSIS — C799 Secondary malignant neoplasm of unspecified site: Secondary | ICD-10-CM | POA: Diagnosis not present

## 2016-09-25 DIAGNOSIS — C7800 Secondary malignant neoplasm of unspecified lung: Secondary | ICD-10-CM | POA: Diagnosis not present

## 2016-09-25 DIAGNOSIS — Z6825 Body mass index (BMI) 25.0-25.9, adult: Secondary | ICD-10-CM | POA: Diagnosis not present

## 2016-09-25 DIAGNOSIS — N400 Enlarged prostate without lower urinary tract symptoms: Secondary | ICD-10-CM | POA: Diagnosis not present

## 2016-09-30 ENCOUNTER — Ambulatory Visit (HOSPITAL_COMMUNITY): Payer: Medicare Other

## 2016-09-30 ENCOUNTER — Ambulatory Visit (INDEPENDENT_AMBULATORY_CARE_PROVIDER_SITE_OTHER): Payer: Medicare Other | Admitting: Internal Medicine

## 2016-09-30 ENCOUNTER — Encounter (HOSPITAL_COMMUNITY): Payer: Self-pay | Admitting: Adult Health

## 2016-09-30 ENCOUNTER — Encounter (HOSPITAL_COMMUNITY): Payer: Medicare Other

## 2016-09-30 ENCOUNTER — Encounter (HOSPITAL_BASED_OUTPATIENT_CLINIC_OR_DEPARTMENT_OTHER): Payer: Medicare Other

## 2016-09-30 ENCOUNTER — Encounter: Payer: Self-pay | Admitting: Internal Medicine

## 2016-09-30 ENCOUNTER — Encounter (HOSPITAL_BASED_OUTPATIENT_CLINIC_OR_DEPARTMENT_OTHER): Payer: Medicare Other | Admitting: Adult Health

## 2016-09-30 VITALS — BP 155/78 | HR 72 | Temp 97.9°F | Resp 18 | Wt 204.8 lb

## 2016-09-30 VITALS — BP 157/94 | HR 88 | Temp 97.1°F | Ht 76.0 in | Wt 203.0 lb

## 2016-09-30 DIAGNOSIS — C20 Malignant neoplasm of rectum: Secondary | ICD-10-CM

## 2016-09-30 DIAGNOSIS — E119 Type 2 diabetes mellitus without complications: Secondary | ICD-10-CM

## 2016-09-30 DIAGNOSIS — G620 Drug-induced polyneuropathy: Secondary | ICD-10-CM | POA: Diagnosis not present

## 2016-09-30 DIAGNOSIS — C7889 Secondary malignant neoplasm of other digestive organs: Secondary | ICD-10-CM | POA: Diagnosis not present

## 2016-09-30 DIAGNOSIS — Z5111 Encounter for antineoplastic chemotherapy: Secondary | ICD-10-CM

## 2016-09-30 DIAGNOSIS — C7800 Secondary malignant neoplasm of unspecified lung: Secondary | ICD-10-CM | POA: Diagnosis not present

## 2016-09-30 DIAGNOSIS — G479 Sleep disorder, unspecified: Secondary | ICD-10-CM

## 2016-09-30 DIAGNOSIS — R194 Change in bowel habit: Secondary | ICD-10-CM

## 2016-09-30 DIAGNOSIS — R197 Diarrhea, unspecified: Secondary | ICD-10-CM

## 2016-09-30 DIAGNOSIS — Z5112 Encounter for antineoplastic immunotherapy: Secondary | ICD-10-CM | POA: Diagnosis not present

## 2016-09-30 DIAGNOSIS — K6289 Other specified diseases of anus and rectum: Secondary | ICD-10-CM

## 2016-09-30 LAB — CBC WITH DIFFERENTIAL/PLATELET
Basophils Absolute: 0 10*3/uL (ref 0.0–0.1)
Basophils Relative: 1 %
Eosinophils Absolute: 0.2 10*3/uL (ref 0.0–0.7)
Eosinophils Relative: 3 %
HCT: 40.5 % (ref 39.0–52.0)
Hemoglobin: 13.5 g/dL (ref 13.0–17.0)
Lymphocytes Relative: 36 %
Lymphs Abs: 1.7 10*3/uL (ref 0.7–4.0)
MCH: 30.2 pg (ref 26.0–34.0)
MCHC: 33.3 g/dL (ref 30.0–36.0)
MCV: 90.6 fL (ref 78.0–100.0)
Monocytes Absolute: 0.6 10*3/uL (ref 0.1–1.0)
Monocytes Relative: 12 %
Neutro Abs: 2.3 10*3/uL (ref 1.7–7.7)
Neutrophils Relative %: 48 %
Platelets: 147 10*3/uL — ABNORMAL LOW (ref 150–400)
RBC: 4.47 MIL/uL (ref 4.22–5.81)
RDW: 14.9 % (ref 11.5–15.5)
WBC: 4.7 10*3/uL (ref 4.0–10.5)

## 2016-09-30 LAB — COMPREHENSIVE METABOLIC PANEL
ALT: 21 U/L (ref 17–63)
AST: 23 U/L (ref 15–41)
Albumin: 3.6 g/dL (ref 3.5–5.0)
Alkaline Phosphatase: 71 U/L (ref 38–126)
Anion gap: 7 (ref 5–15)
BUN: 9 mg/dL (ref 6–20)
CO2: 26 mmol/L (ref 22–32)
Calcium: 8.7 mg/dL — ABNORMAL LOW (ref 8.9–10.3)
Chloride: 108 mmol/L (ref 101–111)
Creatinine, Ser: 0.68 mg/dL (ref 0.61–1.24)
GFR calc Af Amer: >60 mL/min (ref >60)
GFR calc non Af Amer: >60 mL/min (ref >60)
Glucose, Bld: 105 mg/dL — ABNORMAL HIGH (ref 65–99)
Potassium: 3.4 mmol/L — ABNORMAL LOW (ref 3.5–5.1)
Sodium: 141 mmol/L (ref 135–145)
Total Bilirubin: 1.6 mg/dL — ABNORMAL HIGH (ref 0.3–1.2)
Total Protein: 6.2 g/dL — ABNORMAL LOW (ref 6.5–8.1)

## 2016-09-30 LAB — URINALYSIS, DIPSTICK ONLY
Bilirubin Urine: NEGATIVE
Glucose, UA: NEGATIVE mg/dL
Ketones, ur: NEGATIVE mg/dL
Leukocytes, UA: NEGATIVE
Nitrite: NEGATIVE
Protein, ur: NEGATIVE mg/dL
Specific Gravity, Urine: 1.025 (ref 1.005–1.030)
pH: 5.5 (ref 5.0–8.0)

## 2016-09-30 MED ORDER — DEXAMETHASONE SODIUM PHOSPHATE 10 MG/ML IJ SOLN
10.0000 mg | Freq: Once | INTRAMUSCULAR | Status: AC
  Administered 2016-09-30: 10 mg via INTRAVENOUS
  Filled 2016-09-30: qty 1

## 2016-09-30 MED ORDER — DEXTROSE 5 % IV SOLN
Freq: Once | INTRAVENOUS | Status: AC
  Administered 2016-09-30: 12:00:00 via INTRAVENOUS

## 2016-09-30 MED ORDER — HYDROCODONE-ACETAMINOPHEN 5-325 MG PO TABS: 1.0000 | ORAL_TABLET | Freq: Four times a day (QID) | ORAL | 0 refills | Status: DC | PRN

## 2016-09-30 MED ORDER — PALONOSETRON HCL INJECTION 0.25 MG/5ML
0.2500 mg | Freq: Once | INTRAVENOUS | Status: AC
  Administered 2016-09-30: 0.25 mg via INTRAVENOUS
  Filled 2016-09-30: qty 5

## 2016-09-30 MED ORDER — SODIUM CHLORIDE 0.9% FLUSH
10.0000 mL | INTRAVENOUS | Status: DC | PRN
Start: 2016-09-30 — End: 2016-09-30
  Administered 2016-09-30: 10 mL
  Filled 2016-09-30: qty 10

## 2016-09-30 MED ORDER — LEUCOVORIN CALCIUM INJECTION 350 MG
400.0000 mg/m2 | Freq: Once | INTRAVENOUS | Status: AC
  Administered 2016-09-30: 880 mg via INTRAVENOUS
  Filled 2016-09-30: qty 44

## 2016-09-30 MED ORDER — DIPHENHYDRAMINE HCL 50 MG/ML IJ SOLN
50.0000 mg | Freq: Once | INTRAMUSCULAR | Status: AC
  Administered 2016-09-30: 50 mg via INTRAVENOUS
  Filled 2016-09-30: qty 1

## 2016-09-30 MED ORDER — OXALIPLATIN CHEMO INJECTION 100 MG/20ML
85.0000 mg/m2 | Freq: Once | INTRAVENOUS | Status: AC
  Administered 2016-09-30: 185 mg via INTRAVENOUS
  Filled 2016-09-30: qty 37

## 2016-09-30 MED ORDER — SODIUM CHLORIDE 0.9 % IV SOLN: 10.0000 mg | Freq: Once | INTRAVENOUS | Status: DC

## 2016-09-30 MED ORDER — SODIUM CHLORIDE 0.9 % IV SOLN
5.3000 mg/kg | Freq: Once | INTRAVENOUS | Status: AC
  Administered 2016-09-30: 500 mg via INTRAVENOUS
  Filled 2016-09-30: qty 16

## 2016-09-30 MED ORDER — SODIUM CHLORIDE 0.9 % IV SOLN
2400.0000 mg/m2 | INTRAVENOUS | Status: DC
  Administered 2016-09-30: 5300 mg via INTRAVENOUS
  Filled 2016-09-30: qty 106

## 2016-09-30 MED ORDER — FLUOROURACIL CHEMO INJECTION 2.5 GM/50ML
400.0000 mg/m2 | Freq: Once | INTRAVENOUS | Status: AC
  Administered 2016-09-30: 900 mg via INTRAVENOUS
  Filled 2016-09-30: qty 18

## 2016-09-30 NOTE — Progress Notes (Signed)
Charles [name], [place] [id number]   CLINIC:  Medical Oncology/Hematology  PCP:  [name], MD [place] [id number] [phone]   REASON FOR VISIT:  Follow-up for Stage IVA adenocarcinoma of rectum with lung and pancreatic mets  CURRENT THERAPY: FOLFOX/Avastin, beginning 07/08/16   BRIEF ONCOLOGIC HISTORY:    Rectal cancer ([place])   07/24/2010 Initial Diagnosis    Invasive adenocarcinoma of rectum      09/09/2010 Concurrent Chemotherapy    S/P radiation and concurrent 5-FU continuous infusion from 09/09/10- 10/10/10.      11/14/2010 Surgery    S/P proctectomy with colorectal anastomosis and diverting loop ileostomy on 11/14/10 at [facility] by Dr. Harlon Ditty. Pathology reveals a pT3b N1 with 3/20 lymph nodes.      02/05/2011 - 07/14/2011 Chemotherapy    FOLFOX      08/18/2011 Surgery    Approximate date of surgery- [place] by Dr. Harlon Ditty       Remission         11/24/2013 Survivorship    Colonoscopy- somewhat fibrotic/friable anastomotic mucosal-status post biopsy (narrowing not felt to be clinically significant).  Negative pathology for malignancy      09/12/2014 Imaging    DVT in the left femoral venous system, left common iliac vein, IVC, and within the IVC filter Right lower extremity venography confirms chronic occlusion of the femoral venous system with collateralization. The right iliac venous system is patent and do      09/25/2014 PET scan    The right middle lobe pulmonary nodule is hypermetabolic, favored to represent a primary bronchogenic carcinoma.Equivocal mediastinal nodes, similar to surrounding blood pool. Bilateral adrenal hypermetabolism, felt to be physiologic      10/24/2014 Pathology Results    Lung, needle/core biopsy(ies), RML - ADENOCARCINOMA, SEE COMMENT metastatic adenocarcinoma of a colorectal primary      10/24/2014 Relapse/Recurrence         11/16/2014 Definitive Surgery    Bronchoscopy,  right video-assisted thoracoscopy, wedge resection of right middle lobe by Dr. Servando Snare      11/16/2014 Pathology Results    Lung, wedge biopsy/resection, right lingula and small portion of middle lobe - METASTATIC ADENOCARCINOMA, CONSISTENT WITH COLORECTAL PRIMARY, SPANNING 2.0 CM. - THE SURGICAL RESECTION MARGINS ARE NEGATIVE FOR ADENOCARCINOMA.      11/16/2014 Remission    THE SURGICAL RESECTION MARGINS ARE NEGATIVE FOR ADENOCARCINOMA.      03/07/2015 Imaging    CT CAP- Interval resection of right middle lobe metastasis. No acute process or evidence of metastatic disease in the chest, abdomen or pelvis. Improved right upper lobe reticular nodular opacities are favored to represent resolving infection.      09/14/2015 Imaging    CT CAP- No findings of recurrent malignancy. No recurrence along the wedge resection site of the right middle lobe. Right anterior abdominal wall focal hernia containing a knuckle of small bowel without complicating feature.      06/13/2016 Imaging    CT CAP- 1. New right-sided pleural metastasis/mass. Other smaller right-sided pulmonary nodules which are all pleural-based and most likely represent pleural metastasis. 2. No convincing evidence of abdominopelvic nodal metastasis. 3. Constellation of findings, including pancreatic atrophy, duct dilatation, and pancreatic head soft tissue fullness which are highly suspicious for pancreatic adenocarcinoma. Metastatic disease felt much less likely. Consider endoscopic ultrasound sampling or ERCP. Cannot exclude superimposed acute pancreatitis. 4. New enlargement of the appendiceal tip with subtle surrounding edema. Cannot  exclude early or mild appendicitis. 5. Coronary artery atherosclerosis. Aortic atherosclerosis. 6. 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      06/13/2016 Progression    CT scan demonstrates progression of disease      06/19/2016 Procedure    EUS with FNA by Dr.  Ardis Hughs      06/20/2016 Pathology Results    FINE NEEDLE ASPIRATION, ENDOSCOPIC, PANCREAS UNCINATE AREA(SPECIMEN 1 OF 1 COLLECTED 06/19/16): MALIGNANT CELLS CONSISTENT WITH METASTATIC ADENOCARCINOMA.      06/25/2016 PET scan    1. Two pleural-based nodules in the right hemithorax are hypermetabolic. Metastatic disease is a distinct consideration. The scattered pulmonary parenchymal nodule seen on previous diagnostic CT imaging are below the threshold for reliable resolution on PET imaging. 2. Hypermetabolic lesion pancreatic head, consistent with neoplasm. As noted on prior CT, adenocarcinoma is any consideration. No evidence for hypermetabolic abdominal lymphadenopathy. Mottled uptake noted in the liver, but no discrete hepatic metastases are evident on PET imaging. 3. Appendix remains distended up to 0.9-10 mm diameter with a stone towards the tip in subtle periappendiceal edema/inflammation. The appendix is hypermetabolic along its length. Imaging features are relatively stable in the 12 day interval since prior CT scan. While appendicitis is a consideration, the relative stability over 12 days would be unusual for that etiology. Continued close follow-up recommended.      06/26/2016 Pathology Results    Not enough tissue for foundationONE or K-ras testing.      07/07/2016 Procedure    Port placed by Dr. Arnoldo Morale      07/08/2016 -  Chemotherapy    The patient had pegfilgrastim (NEULASTA) injection 6 mg, 6 mg, Subcutaneous,  Once, 0 of 4 cycles  ondansetron (ZOFRAN) IVPB 8 mg, 8 mg, Intravenous,  Once, 0 of 4 cycles  leucovorin 804 mg in dextrose 5 % 250 mL infusion, 400 mg/m2, Intravenous,  Once, 0 of 4 cycles  oxaliplatin (ELOXATIN) 170 mg in dextrose 5 % 500 mL chemo infusion, 85 mg/m2, Intravenous,  Once, 0 of 4 cycles  fluorouracil (ADRUCIL) 4,800 mg in sodium chloride 0.9 % 150 mL chemo infusion, 2,400 mg/m2 = 4,800 mg, Intravenous, 1 Day/Dose, 0 of 4 cycles  fluorouracil  (ADRUCIL) chemo injection 800 mg, 400 mg/m2, Intravenous,  Once, 0 of 4 cycles  pegfilgrastim (NEULASTA) injection 6 mg, 6 mg, Subcutaneous,  Once, 2 of 2 cycles  ondansetron (ZOFRAN) IVPB 8 mg, 8 mg, Intravenous,  Once, 12 of 12 cycles  leucovorin 660 mg in dextrose 5 % 250 mL infusion, 660 mg (original dose ), Intravenous,  Once, 12 of 12 cycles Dose modification: 660 mg (Cycle 1)  oxaliplatin (ELOXATIN) 140 mg in dextrose 5 % 500 mL chemo infusion, 140 mg (original dose ), Intravenous,  Once, 12 of 12 cycles Dose modification: 140 mg (Cycle 1), 119 mg (85 % of original dose 140 mg, Cycle 8, Reason: Provider Judgment)  fluorouracil (ADRUCIL) 3,850 mg in sodium chloride 0.9 % 150 mL chemo infusion, 3,850 mg (original dose ), Intravenous, 1 Day/Dose, 12 of 12 cycles Dose modification: 3,850 mg (Cycle 1)  fluorouracil (ADRUCIL) chemo injection 700 mg, 700 mg (original dose ), Intravenous,  Once, 12 of 12 cycles Dose modification: 700 mg (Cycle 1)  palonosetron (ALOXI) injection 0.25 mg, 0.25 mg, Intravenous,  Once, 7 of 12 cycles Administration: 0.25 mg (09/30/2016)  bevacizumab (AVASTIN) 475 mg in sodium chloride 0.9 % 100 mL chemo infusion, 5 mg/kg = 475 mg, Intravenous,  Once, 6 of 11  cycles Administration: 500 mg (09/30/2016)  leucovorin 880 mg in dextrose 5 % 250 mL infusion, 400 mg/m2 = 880 mg, Intravenous,  Once, 7 of 12 cycles Administration: 880 mg (09/30/2016)  oxaliplatin (ELOXATIN) 185 mg in dextrose 5 % 500 mL chemo infusion, 85 mg/m2 = 185 mg, Intravenous,  Once, 7 of 12 cycles Administration: 185 mg (09/30/2016)  fluorouracil (ADRUCIL) chemo injection 900 mg, 400 mg/m2 = 900 mg, Intravenous,  Once, 7 of 12 cycles Administration: 900 mg (09/30/2016)  fluorouracil (ADRUCIL) 5,300 mg in sodium chloride 0.9 % 144 mL chemo infusion, 2,400 mg/m2 = 5,300 mg, Intravenous, 1 Day/Dose, 7 of 12 cycles Administration: 5,300 mg (09/30/2016)  for chemotherapy treatment.         08/27/2016 Imaging    CT CAP- 1. Pulmonary metastatic disease is stable. 2. Pancreatic head mass, grossly stable, with progressive atrophy of the body and tail of the pancreas. Stable peripancreatic lymph node. 3. Mild circumferential rectal wall thickening, stable. 4. Aortic atherosclerosis (ICD10-170.0). Coronary artery calcification. 5. Hyperattenuating lesion off the lower pole left kidney, stable, too small to characterize. Continued attention on followup exams is warranted. 6. Persistent wall thickening and mild dilatation of the proximal appendix, of uncertain etiology. Continued attention on followup exams is warranted.         INTERVAL HISTORY:  Mr. Telford 66 y.o. male returns for follow-up for Stage IVA rectal cancer with lung and pancreatic mets; here for consideration of next cycle chemotherapy.   Due for cycle #7 FOLFOX/Avastin today.  Overall, he tells me he has felt "pretty well."  He struggles with his appetite, which is about 50%, in large part d/t decreased taste.  His energy levels are about 75%. He tells me that he does not sleep well.   He is having frequent stools, which is leading to rectal pain. He saw Dr. [name] today; Mr. Holleran tells me he was given some prescriptions for ointments to help with the rectal irritation/pain.  Denies frank diarrhea, just frequent stools.  Reports leg swelling, which is no worse than previous. Endorses occasional dizziness, which is improving since his diabetes medications were adjusted. He was reportedly having hypoglycemia episodes and getting very dizzy; this is improved.  Endorses that he is not sleeping well, despite taking prescribed sleeping aid.   Endorses numbness/tingling to his fingers/toes (which is stable) and to either side of his nose on his face, which is new. Neuropathy does not interfere with his ADLs. He is still able to button shirts, write his name, and perform activities with his hands that require dexterity.    He continues to work "on my off weeks of chemo."    Overall, he feels well enough to proceed with his next cycle of scheduled chemotherapy today.      REVIEW OF SYSTEMS:  Review of Systems  Constitutional: Positive for appetite change and fatigue. Negative for chills and fever.  HENT:  Negative.   Eyes: Negative.   Respiratory: Negative.  Negative for cough and shortness of breath.   Cardiovascular: Positive for leg swelling.  Gastrointestinal: Positive for rectal pain. Negative for abdominal pain, blood in stool, constipation and diarrhea.       Frequent stools; denies diarrhea   Endocrine: Negative.   Genitourinary: Negative.  Negative for bladder incontinence and dysuria.   Musculoskeletal: Negative.   Skin: Negative.   Neurological: Positive for dizziness and numbness. Negative for headaches.  Psychiatric/Behavioral: Positive for sleep disturbance.     PAST MEDICAL/SURGICAL HISTORY:  Past Medical  History:  Diagnosis Date  . Chronic anticoagulation   . Colon cancer ([place]) 07/24/2010   rectal ca, inv adenocarcinoma  . Depression 04/01/2011  . Diabetes mellitus without complication ([place])   . DVT (deep venous thrombosis) ([place]) 05/09/2011  . GERD (gastroesophageal reflux disease)   . High output ileostomy ([place]) 05/21/2011  . History of kidney stones   . HTN (hypertension)   . Hx of radiation therapy 09/02/10 to 10/14/10   pelvis  . Lung metastasis ([place])   . Neuropathy   . Peripheral vascular disease ([place])    dvt's,pe  . Pneumonia    hx x3  . Pulmonary embolism ([place])   . Rectal cancer ([place]) 01/15/2011   S/P radiation and concurrent 5-FU continuous infusion from 09/09/10- 10/10/10.  S/P proctectomy with colorectal anastomosis and diverting loop ileostomy on 11/14/10 at [facility] by Dr. Harlon Ditty. Pathology reveals a pT3b N1 with 3/20 lymph nodes.     Past Surgical History:  Procedure Laterality Date  . COLON SURGERY  11/14/2010   proctectomy with colorectal anastomosis and  diverting loop ileostomy (temporary planned)  . COLONOSCOPY  07/2010   proximal rectal apple core mass 10-14cm from anal verge (adenocarcinoma), 2-3cm distal rectal carpet polyp s/p piecemeal snare polypectomy (adenoma)  . COLONOSCOPY  04/21/2012   RMR: Friable,fibrotic appearing colorectal anastomosis producing some luminal narrowing-not felt to be critical. path: focal erosion with slight inflammation and hyperemia. SURVEILLANCE DUE DEC 2015  . COLONOSCOPY N/A 11/24/2013   Dr. Rourk:somewhat fibrotic/friable anastomotic mucosal-status post biopsy (narrowing not felt to be clinically significant) Single colonic diverticulum. benign polypoid rectal mucosa  . colostomy reversal  april 2013  . ESOPHAGOGASTRODUODENOSCOPY  07/2010   RMR: schatki ring s/p dilation, small hh, SB bx benign  . ESOPHAGOGASTRODUODENOSCOPY N/A 09/04/2015   Procedure: ESOPHAGOGASTRODUODENOSCOPY (EGD);  Surgeon: Daneil Dolin, MD;  Location: [facility];  Service: Endoscopy;  Laterality: N/A;  730  . EUS  08/2010   Dr. Owens Loffler. uT3N0 circumferential, nearly obstruction rectosigmoid adenocarcinoma, distal edge 12cm from anal verge  . EUS N/A 06/19/2016   Procedure: UPPER ENDOSCOPIC ULTRASOUND (EUS) RADIAL;  Surgeon: Milus Banister, MD;  Location: [facility];  Service: Endoscopy;  Laterality: N/A;  . HERNIA REPAIR     abd hernia repair  . IVC filter    . ivc filter    . port a cath placement    . PORT-A-CATH REMOVAL  09/24/2011   Procedure: REMOVAL PORT-A-CATH;  Surgeon: Donato Heinz, MD;  Location: [facility];  Service: General;  Laterality: N/A;  Minor Room  . PORTACATH PLACEMENT Right 07/07/2016   Procedure: INSERTION PORT-A-CATH;  Surgeon: Aviva Signs, MD;  Location: [facility];  Service: General;  Laterality: Right;  Marland Kitchen VIDEO ASSISTED THORACOSCOPY (VATS)/WEDGE RESECTION Right 11/15/2014   Procedure: VIDEO ASSISTED THORACOSCOPY (VATS)/LUNG RESECTION WITH RIGHT LINGULECTOMY;  Surgeon: [name], MD;  Location: [facility];  Service: Thoracic;  Laterality: Right;  Marland Kitchen VIDEO BRONCHOSCOPY N/A 11/15/2014   Procedure: VIDEO BRONCHOSCOPY;  Surgeon: [name], MD;  Location: [facility];  Service: Thoracic;  Laterality: N/A;     SOCIAL HISTORY:  Social History   Social History  . Marital status: Married    Spouse name: N/A  . Number of children: 2  . Years of education: N/A   Occupational History  . 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 Smoking status: Never Smoker  . Smokeless tobacco: Never Used  .  Alcohol use No  . Drug use: No  . Sexual activity: Yes    Birth control/ protection: None     Comment: married   Other Topics Concern  . Not on file   Social History Narrative  . No narrative on file    FAMILY HISTORY:  Family History  Problem Relation Age of Onset  . Cancer Brother        throat  . Cancer Brother        prostate  . Colon cancer Neg Hx   . Liver disease Neg Hx   . Inflammatory bowel disease Neg Hx     CURRENT MEDICATIONS:  Outpatient Encounter Prescriptions as of 09/30/2016  Medication Sig  . clonazePAM (KLONOPIN) 0.5 MG tablet   . clonazePAM (KLONOPIN) 1 MG tablet Take 1 tablet (1 mg total) by mouth 2 (two) times daily as needed for anxiety.  Marland Kitchen dexamethasone (DECADRON) 4 MG tablet Take 2 tablets (8 mg total) by mouth daily. Start the day after chemotherapy for 2 days. Take with food.  . Diphenhyd-Hydrocort-Nystatin (FIRST-DUKES MOUTHWASH) SUSP Use as directed 5 mLs in the mouth or throat 4 (four) times daily.  . diphenoxylate-atropine (LOMOTIL) 2.5-0.025 MG tablet Take 1 tablet by mouth 4 (four) times daily as needed for diarrhea or loose stools.  . fluorouracil CALGB 83419 in sodium chloride 0.9 % 150 mL Inject into the vein over 96 hr.  . gabapentin (NEURONTIN) 300 MG capsule Take 3 capsules (900 mg total) by mouth 2 (two) times daily.  Marland Kitchen glimepiride (AMARYL) 2 MG tablet Take 2 mg by mouth daily  with breakfast.   . HYDROcodone-acetaminophen (NORCO/VICODIN) 5-325 MG tablet Take 1 tablet by mouth every 6 (six) hours as needed for moderate pain.  . hydrocortisone (ANUSOL-HC) 2.5 % rectal cream Place 1 application rectally 2 (two) times daily.  Marland Kitchen leucovorin in dextrose 5 % 250 mL Inject into the vein once.  . lidocaine (XYLOCAINE) 2 % jelly   . Lidocaine 2 % GEL Apply 1 application topically 4 (four) times daily as needed.  . lidocaine-prilocaine (EMLA) cream Apply to affected area once  . mirtazapine (REMERON) 7.5 MG tablet Take 7.5 mg by mouth at bedtime.  . Nitroglycerin 0.4 % OINT Place rectally as needed.  Marland Kitchen omeprazole (PRILOSEC) 20 MG capsule Take 20 mg by mouth 2 (two) times daily before a meal.   . ondansetron (ZOFRAN) 8 MG tablet Take 1 tablet (8 mg total) by mouth 2 (two) times daily as needed for refractory nausea / vomiting. Start on day 3 after chemotherapy.  . OXALIPLATIN IV Inject into the vein.  Marland Kitchen prochlorperazine (COMPAZINE) 10 MG tablet Take 1 tablet (10 mg total) by mouth every 6 (six) hours as needed (Nausea or vomiting).  . psyllium (METAMUCIL) 58.6 % powder Take 1 packet by mouth daily.   . rivaroxaban (XARELTO) 20 MG TABS tablet Take 1 tablet (20 mg total) by mouth daily with supper.  . tamsulosin (FLOMAX) 0.4 MG CAPS capsule Take 0.4 mg by mouth at bedtime.   . traZODone (DESYREL) 100 MG tablet Take 100 mg by mouth at bedtime.  Marland Kitchen zinc oxide 20 % ointment Apply 1 application topically as needed for irritation.  . [DISCONTINUED] HYDROcodone-acetaminophen (NORCO/VICODIN) 5-325 MG tablet Take 1 tablet by mouth every 6 (six) hours as needed for moderate pain. (Patient not taking: Reported on 09/30/2016)   Facility-Administered Encounter Medications as of 09/30/2016  Medication  . sodium chloride 0.9 % injection 10 mL    ALLERGIES:  Allergies  Allergen Reactions  . Oxycodone     Blisters, hallucinations  . Tramadol     Blisters, hallucinations  . Trazodone And  Nefazodone Other (See Comments)    hallucinations     PHYSICAL EXAM:  ECOG Performance status: 1 - Symptomatic; remains independent      Physical Exam  Constitutional: He is oriented to person, place, and time and well-developed, well-nourished, and in no distress.  HENT:  Head: Normocephalic.  Mouth/Throat: Oropharynx is clear and moist.  Eyes: Conjunctivae are normal. No scleral icterus.  Neck: Normal range of motion. Neck supple.  Cardiovascular: Normal rate and regular rhythm.   Pulmonary/Chest: Effort normal and breath sounds normal. No respiratory distress.  Abdominal: Soft. Bowel sounds are normal. There is no tenderness.  Musculoskeletal: Normal range of motion. He exhibits edema (Trace BLE edema ).  Lymphadenopathy:    He has no cervical adenopathy.       Right: No supraclavicular adenopathy present.       Left: No supraclavicular adenopathy present.  Neurological: He is alert and oriented to person, place, and time. No cranial nerve deficit.  Skin: Skin is warm and dry. No rash noted.  Psychiatric: Mood, memory, affect and judgment normal.  Nursing note and vitals reviewed.    LABORATORY DATA:  I have reviewed the labs as listed.  CBC    Component Value Date/Time   WBC 4.7 09/30/2016 0901   RBC 4.47 09/30/2016 0901   HGB 13.5 09/30/2016 0901   HGB 11.6 (L) 02/04/2011 1059   HCT 40.5 09/30/2016 0901   HCT 34.7 (L) 02/04/2011 1059   PLT 147 (L) 09/30/2016 0901   PLT 282 02/04/2011 1059   MCV 90.6 09/30/2016 0901   MCV 76.0 (L) 02/04/2011 1059   MCH 30.2 09/30/2016 0901   MCHC 33.3 09/30/2016 0901   RDW 14.9 09/30/2016 0901   RDW 18.5 (H) 02/04/2011 1059   LYMPHSABS 1.7 09/30/2016 0901   LYMPHSABS 1.1 02/04/2011 1059   MONOABS 0.6 09/30/2016 0901   MONOABS 0.5 02/04/2011 1059   EOSABS 0.2 09/30/2016 0901   EOSABS 0.1 02/04/2011 1059   BASOSABS 0.0 09/30/2016 0901   BASOSABS 0.1 02/04/2011 1059   CMP Latest Ref Rng & Units 09/30/2016 09/16/2016  09/02/2016  Glucose 65 - 99 mg/dL 105(H) 188(H) 216(H)  BUN 6 - 20 mg/dL _0 Creatinine 0.61 - 1.24 mg/dL 0.68 0.67 0.72  Sodium 135 - 145 mmol/L 141 139 138  Potassium 3.5 - 5.1 mmol/L 3.4(L) 3.6 3.6  Chloride 101 - 111 mmol/L 108 107 107  CO2 22 - 32 mmol/L _1 Calcium 8.9 - 10.3 mg/dL 8.7(L) 8.3(L) 8.1(L)  Total Protein 6.5 - 8.1 g/dL 6.2(L) 5.7(L) 5.4(L)  Total Bilirubin 0.3 - 1.2 mg/dL 1.6(H) 1.4(H) 1.0  Alkaline Phos 38 - 126 U/L 71 73 75  AST 15 - 41 U/L _2 ALT 17 - 63 U/L _3 PENDING LABS:    DIAGNOSTIC IMAGING:  *The following radiologic images and reports have been reviewed independently and agree with below findings.  CT chest/abd/pelvis: 08/26/16       PATHOLOGY:  EUS pancreatic mass biopsy: 06/19/16    Lung biopsy: 11/15/14           ASSESSMENT & PLAN:   Stage IVA adenocarcinoma of rectum with lung and pancreatic mets:  -Initially diagnosed in 07/2010. Underwent concurrent chemoradiation, followed by surgical resection. He went on to have about 5  months of adjuvant FOLFOX chemotherapy followed by additional surgery at [facility]. He was found to be in remission after that time. Subsequent restaging PET imaging in 09/2014 revealed hypermetabolic lung lesion, which was biopsied to show adenocarcinoma consistent with colorectal primary. Underwent wedge resection with Dr. Servando Snare in 11/2014. On 06/13/16, he was noted to have several pleural based nodules representing metastatic disease, as well as pancreatic lesion suspicion for malignancy. Pancreatic lesion was biopsied via EUS and found to be metastatic adenocarcinoma.  Treatment started at [facility] with FOLFOX/Avastin on 07/08/16.  Most recent restaging imaging with CT chest/abd/pelvis on 08/27/16 showed stable disease.  -Patient's wife requesting a printed copy of the past 3 scans; I am happy to print these for her today.  -Due for cycle #7 chemotherapy today. Labs reviewed and are  adequate for treatment. Endorses some peripheral neuropathy, which appears to be stable and not interfering with ADLs. We will certainly keep monitoring going forward.  -Restaging imaging will be due in about 6 weeks. Orders placed today for CT chest/abd/pelvis.  -Continue FOLFOX/Avastin as scheduled.  -Return to cancer center in 1 month for continued follow-up.   Rectal pain:  -Likely d/t cancer. Also with mild suprapubic pain with no other symptoms.   Most recent CT scan did show some concerns for possible appendicitis, however patient remains largely asymptomatic.   -Of note, if Dr. [name] (or a surgeon for appendectomy) anticipates invasive procedure in the future for any reason, then we will need to coordinate with his office to hold the Avastin d/t increased risk of bleeding and infection.   -[place] Controlled Substance Reporting System reviewed and refill of Norco appropriate. Paper prescription given to patient today.     Sleep disturbance:  -Likely secondary to timing of steroids before and after chemotherapy. He tells me he was taking the steroids with dinner.  -Recommended he change the time he takes the steroids to no later than 12 pm, so as to help decrease steroid-induced wakefulness.  He already has sleep aid medications, which will likely be more helpful with changes in how he takes the steroids.   Goals of care:  -Briefly discussed goals of care with patient and his wife today. They understand that his disease is not curable, but remains treatable.  Discussed with them that our goals are to control the cancer, while not causing severe adverse effects and not cause suffering from the treatment or disease itself.  I shared with them that there will likely come a time when either the treatment is no longer effective, the side effects are too much to bear, or continuing treatment are no longer the patient's wishes. At that time, we would recommend best supportive care/optimization of quality  of life vs continuing to treat the cancer.  They voiced understanding and appreciation for this discussion today.  We will certainly continue to discuss at future visits as well.       Dispo:  -Continue FOLFOX/Avastin as scheduled.  -Return to cancer center in 1 month for continued follow-up.  -Restaging CT chest/abd/pelvis due in about 6 weeks; orders placed today.    All questions were answered to patient's stated satisfaction. Encouraged patient to call with any new concerns or questions before his next visit to the cancer center and we can certain see him sooner, if needed.    Plan of care discussed with Dr. Irene Limbo, who agrees with the above aforementioned.    Orders placed this encounter:  Orders Placed This Encounter  Procedures  . CT ABDOMEN PELVIS W CONTRAST  . CT CHEST [place], NP [place] [id number]

## 2016-09-30 NOTE — Patient Instructions (Signed)
[place] at [facility] Discharge Instructions  RECOMMENDATIONS MADE BY THE CONSULTANT AND ANY TEST RESULTS WILL BE SENT TO YOUR REFERRING PHYSICIAN.  You were seen today by Mike Craze NP. Continue Chemo every 2 weeks as scheduled. CT scan in about 6 weeks.  Return in 1 months for follow up.   Thank you for choosing [place] at [facility] to provide your oncology and hematology care.  To afford each patient quality time with our provider, please arrive at least 15 minutes before your scheduled appointment time.    If you have a lab appointment with the [place] please come in thru the  Main Entrance and check in at the main information desk  You need to re-schedule your appointment should you arrive 10 or more minutes late.  We strive to give you quality time with our providers, and arriving late affects you and other patients whose appointments are after yours.  Also, if you no show three or more times for appointments you may be dismissed from the clinic at the providers discretion.     Again, thank you for choosing [facility].  Our hope is that these requests will decrease the amount of time that you wait before being seen by our physicians.       _____________________________________________________________  Should you have questions after your visit to [facility], please contact our office at [phone] between the hours of 8:30 a.m. and 4:30 p.m.  Voicemails left after 4:30 p.m. will not be returned until the following business day.  For prescription refill requests, have your pharmacy contact our office.       Resources For Cancer Patients and their Caregivers ? American Cancer Society: Can assist with transportation, wigs, general needs, runs Look Good Feel Better.        201-383-5009 ? Cancer Care: Provides financial assistance, online support groups, medication/co-pay assistance.   1-800-813-HOPE 920-756-0447) ? [place] Assists [place] cancer patients and their families through emotional , educational and financial support.  ([phone] ? [place] DSS Where to apply for food stamps, Medicaid and utility assistance. 337-881-2618 ? RCATS: Transportation to medical appointments. 223-141-3361 ? Social Security Administration: May apply for disability if have a Stage IV cancer. 534-285-4070 279-159-8538 ? [employer], Disability and Transit Services: Assists with nutrition, care and transit needs. [place] Support Programs: @10RELATIVEDAYS @ > Cancer Support Group  2nd Tuesday of the month 1pm-2pm, Journey Room  > Creative Journey  3rd Tuesday of the month 1130am-1pm, Journey Room  > Look Good Feel Better  1st Wednesday of the month 10am-12 noon, Journey Room (Call [place] to register [phone])

## 2016-09-30 NOTE — Patient Instructions (Addendum)
Stop fiber supplement  Stop Desitin  Use NTG ointment to anorectum 4x daily  GI pathogen panel with Cdiff on stool sample  Use miconazole nitrate cream to irritated anal area 4x daily in between NTG applications  Begin Probiotic like Align daily  Glad you are decreasing metformin as this may have much to do with going to the bathroom too frequently  May continue omeprazole 20 mg daily  You may or may not need another colonoscopy in the near future depending on how you do  Office visit in 1 month

## 2016-09-30 NOTE — Patient Instructions (Signed)
[facility] Discharge Instructions for Patients Receiving Chemotherapy   Beginning January 23rd 2017 lab work for the [facility] will be done in the  Main lab at [facility] on 1st floor. If you have a lab appointment with the [place] please come in thru the  Main Entrance and check in at the main information desk   Today you received the following chemotherapy agents Avastin,Oxaliplatin,Leucovorin and 5FU. Follow-up as scheduled. Call clinic for any questions or concerns  To help prevent nausea and vomiting after your treatment, we encourage you to take your nausea medication   If you develop nausea and vomiting, or diarrhea that is not controlled by your medication, call the clinic.  The clinic phone number is [phone]. Office hours are Monday-Friday 8:30am-5:00pm.  BELOW ARE SYMPTOMS THAT SHOULD BE REPORTED IMMEDIATELY:  *FEVER GREATER THAN 101.0 F  *CHILLS WITH OR WITHOUT FEVER  NAUSEA AND VOMITING THAT IS NOT CONTROLLED WITH YOUR NAUSEA MEDICATION  *UNUSUAL SHORTNESS OF BREATH  *UNUSUAL BRUISING OR BLEEDING  TENDERNESS IN MOUTH AND THROAT WITH OR WITHOUT PRESENCE OF ULCERS  *URINARY PROBLEMS  *BOWEL PROBLEMS  UNUSUAL RASH Items with * indicate a potential emergency and should be followed up as soon as possible. If you have an emergency after office hours please contact your primary care physician or go to the nearest emergency department.  Please call the clinic during office hours if you have any questions or concerns.   You may also contact the Patient Navigator at [phone] should you have any questions or need assistance in obtaining follow up care.      Resources For Cancer Patients and their Caregivers ? American Cancer Society: Can assist with transportation, wigs, general needs, runs Look Good Feel Better.        417-294-5378 ? Cancer Care: Provides financial assistance, online support groups, medication/co-pay  assistance.  1-800-813-HOPE (504)344-9254) ? [place] Assists [place] cancer patients and their families through emotional , educational and financial support.  (559)596-3268 ? [place] DSS Where to apply for food stamps, Medicaid and utility assistance. 7016743639 ? RCATS: Transportation to medical appointments. 737-650-4212 ? Social Security Administration: May apply for disability if have a Stage IV cancer. [phone] ? [employer], Disability and Transit Services: Assists with nutrition, care and transit needs. [phone]

## 2016-09-30 NOTE — Progress Notes (Signed)
[name] tolerated chemo tx well without complaints or incident. Labs and urine results reviewed with Dr Irene Limbo prior to administering chemotherapy. VSS upon discharge. Pt discharged with 5FU pump infusing without issues. Pt discharged self ambulatory in satisfactory condition accompanied by his wife

## 2016-09-30 NOTE — Progress Notes (Signed)
Primary Care Physician:  [name], MD Primary Gastroenterologist:  Dr. [name]  Pre-Procedure History & Physical: HPI:  Charles [name] is a 66 y.o. male here with a history of stage IV rectal cancer (lung and pancreas). Issues with hyper defecation over the past few months. Constantly going to the bathroom with urgency -  having a small amount of nonbloody brown pasty stool. Significant perianal rectal pain with bowel movements. Red irritated perianal skin. Afraid to use nitroglycerin cream / topical hydrocortisone  because of the difficulties instilling it /pain. Has been on antibiotics a couple of months ago. Was started on metformin first of this year which correlates somewhat the onset of his symptoms. He's cut back on fiber supplementation. Just last week, he tells me his metformin was decreased by Dr. Woody Seller. We were contemplating a surveillance colonoscopy later in the year.  Patient using a lot of Desitin powder on the perianal skin.  He continues to be anticoagulated. 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   Past Medical History:  Diagnosis Date  . Chronic anticoagulation   . Colon cancer ([place]) 07/24/2010   rectal ca, inv adenocarcinoma  . Depression 04/01/2011  . Diabetes mellitus without complication ([place])   . DVT (deep venous thrombosis) ([place]) 05/09/2011  . GERD (gastroesophageal reflux disease)   . High output ileostomy ([place]) 05/21/2011  . History of kidney stones   . HTN (hypertension)   . Hx of radiation therapy 09/02/10 to 10/14/10   pelvis  . Lung metastasis ([place])   . Neuropathy   . Peripheral vascular disease ([place])    dvt's,pe  . Pneumonia    hx x3  . Pulmonary embolism ([place])   . Rectal cancer ([place]) 01/15/2011   S/P radiation and concurrent 5-FU continuous infusion from 09/09/10- 10/10/10.  S/P proctectomy with colorectal anastomosis and diverting loop ileostomy on 11/14/10 at [facility] by Dr. Harlon Ditty. Pathology reveals a pT3b N1 with 3/20 lymph nodes.       Past Surgical History:  Procedure Laterality Date  . COLON SURGERY  11/14/2010   proctectomy with colorectal anastomosis and diverting loop ileostomy (temporary planned)  . COLONOSCOPY  07/2010   proximal rectal apple core mass 10-14cm from anal verge (adenocarcinoma), 2-3cm distal rectal carpet polyp s/p piecemeal snare polypectomy (adenoma)  . COLONOSCOPY  04/21/2012   RMR: Friable,fibrotic appearing colorectal anastomosis producing some luminal narrowing-not felt to be critical. path: focal erosion with slight inflammation and hyperemia. SURVEILLANCE DUE DEC 2015  . COLONOSCOPY N/A 11/24/2013   Dr. Jensen Cheramie:somewhat fibrotic/friable anastomotic mucosal-status post biopsy (narrowing not felt to be clinically significant) Single colonic diverticulum. benign polypoid rectal mucosa  . colostomy reversal  april 2013  . ESOPHAGOGASTRODUODENOSCOPY  07/2010   RMR: schatki ring s/p dilation, small hh, SB bx benign  . ESOPHAGOGASTRODUODENOSCOPY N/A 09/04/2015   Procedure: ESOPHAGOGASTRODUODENOSCOPY (EGD);  Surgeon: Daneil Dolin, MD;  Location: [facility] END[facility] SUITE;  Service: Endoscopy;  Laterality: N/A;  730  . EUS  08/2010   Dr. Owens Loffler. uT3N0 circumferential, nearly obstruction rectosigmoid adenocarcinoma, distal edge 12cm from anal verge  . EUS N/A 06/19/2016   Procedure: UPPER ENDOSCOPIC ULTRASOUND (EUS) RADIAL;  Surgeon: Milus Banister, MD;  Location: [facility] ENDOSCOPY;  Service: Endoscopy;  Laterality: N/A;  . HERNIA REPAIR     abd hernia repair  . IVC filter    . ivc filter    . port a cath placement    . PORT-A-CATH REMOVAL  09/24/2011  Procedure: REMOVAL PORT-A-CATH;  Surgeon: Donato Heinz, MD;  Location: [facility] ORS;  Service: General;  Laterality: N/A;  Minor Room  . PORTACATH PLACEMENT Right 07/07/2016   Procedure: INSERTION PORT-A-CATH;  Surgeon: Aviva Signs, MD;  Location: [facility] ORS;  Service: General;  Laterality: Right;  Marland Kitchen VIDEO ASSISTED THORACOSCOPY (VATS)/WEDGE RESECTION Right 11/15/2014    Procedure: VIDEO ASSISTED THORACOSCOPY (VATS)/LUNG RESECTION WITH RIGHT LINGULECTOMY;  Surgeon: [name], MD;  Location: [place];  Service: Thoracic;  Laterality: Right;  Marland Kitchen VIDEO BRONCHOSCOPY N/A 11/15/2014   Procedure: VIDEO BRONCHOSCOPY;  Surgeon: [name], MD;  Location: [facility] OR;  Service: Thoracic;  Laterality: N/A;    Prior to Admission medications   Medication Sig Start Date End Date Taking? Authorizing Provider  clonazePAM (KLONOPIN) 1 MG tablet Take 1 tablet (1 mg total) by mouth 2 (two) times daily as needed for anxiety. 09/02/16  Yes Twana First, MD  dexamethasone (DECADRON) 4 MG tablet Take 2 tablets (8 mg total) by mouth daily. Start the day after chemotherapy for 2 days. Take with food. 07/02/16  Yes Twana First, MD  Diphenhyd-Hydrocort-Nystatin (FIRST-DUKES MOUTHWASH) SUSP Use as directed 5 mLs in the mouth or throat 4 (four) times daily. 07/10/16  Yes Twana First, MD  diphenoxylate-atropine (LOMOTIL) 2.5-0.025 MG tablet Take 1 tablet by mouth 4 (four) times daily as needed for diarrhea or loose stools. 09/16/16  Yes Baird Cancer, PA-C  fluorouracil CALGB 32671 in sodium chloride 0.9 % 150 mL Inject into the vein over 96 hr.   Yes [provider]  gabapentin (NEURONTIN) 300 MG capsule Take 3 capsules (900 mg total) by mouth 2 (two) times daily. 07/16/16  Yes Kefalas, Manon Hilding, PA-C  glimepiride (AMARYL) 2 MG tablet Take 2 mg by mouth daily with breakfast.    Yes [provider]  leucovorin in dextrose 5 % 250 mL Inject into the vein once.   Yes [provider]  lidocaine (XYLOCAINE) 2 % jelly  07/14/16  Yes [provider]  Lidocaine 2 % GEL Apply 1 application topically 4 (four) times daily as needed. 07/14/16  Yes Baird Cancer, PA-C  lidocaine-prilocaine (EMLA) cream Apply to affected area once 07/02/16  Yes Twana First, MD  mirtazapine (REMERON) 7.5 MG tablet Take 7.5 mg by mouth at bedtime.   Yes [provider]    Nitroglycerin 0.4 % OINT Place rectally as needed.   Yes [provider]  omeprazole (PRILOSEC) 20 MG capsule Take 20 mg by mouth 2 (two) times daily before a meal.    Yes [provider]  ondansetron (ZOFRAN) 8 MG tablet Take 1 tablet (8 mg total) by mouth 2 (two) times daily as needed for refractory nausea / vomiting. Start on day 3 after chemotherapy. 07/02/16  Yes Twana First, MD  OXALIPLATIN IV Inject into the vein.   Yes [provider]  prochlorperazine (COMPAZINE) 10 MG tablet Take 1 tablet (10 mg total) by mouth every 6 (six) hours as needed (Nausea or vomiting). 07/02/16  Yes Twana First, MD  psyllium (METAMUCIL) 58.6 % powder Take 1 packet by mouth daily.    Yes [provider]  rivaroxaban (XARELTO) 20 MG TABS tablet Take 1 tablet (20 mg total) by mouth daily with supper. 11/18/14  Yes Barrett, Erin R, PA-C  tamsulosin (FLOMAX) 0.4 MG CAPS capsule Take 0.4 mg by mouth at bedtime.    Yes [provider]  traZODone (DESYREL) 100 MG tablet Take 100 mg by mouth at  bedtime.   Yes [provider]  zinc oxide 20 % ointment Apply 1 application topically as needed for irritation. 07/14/16  Yes Kefalas, Manon Hilding, PA-C  HYDROcodone-acetaminophen (NORCO/VICODIN) 5-325 MG tablet Take 1 tablet by mouth every 6 (six) hours as needed for moderate pain. Patient not taking: Reported on 09/30/2016 06/23/16   Baird Cancer, PA-C  hydrocortisone (ANUSOL-HC) 2.5 % rectal cream Place 1 application rectally 2 (two) times daily. Patient not taking: Reported on 09/30/2016 08/14/16   Mahala Menghini, PA-C    Allergies as of 09/30/2016 - Review Complete 09/30/2016  Allergen Reaction Noted  . Oxycodone  11/23/2014  . Tramadol  11/23/2014  . Trazodone and nefazodone Other (See Comments) 09/08/2014    Family History  Problem Relation Age of Onset  . Cancer Brother        throat  . Cancer Brother        prostate  . Colon cancer Neg Hx   . Liver disease  Neg Hx   . Inflammatory bowel disease Neg Hx     Social History   Social History  . Marital status: Married    Spouse name: N/A  . Number of children: 2  . Years of education: N/A   Occupational History  . 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 Smoking status: Never Smoker  . Smokeless tobacco: Never Used  . Alcohol use No  . Drug use: No  . Sexual activity: Yes    Birth control/ protection: None     Comment: married   Other Topics Concern  . Not on file   Social History Narrative  . No narrative on file    Review of Systems: See HPI, otherwise negative ROS  Physical Exam: BP (!) 157/94   Pulse 88   Temp 97.1 F (36.2 C) (Oral)   Ht 6\' 4"  (1.93 m)   Wt 203 lb (92.1 kg)   BMI 24.71 kg/m  General:   Alert,  Well-developed, well-nourished, pleasant and cooperative in NAD Neck:  Supple; no masses or thyromegaly. No significant cervical adenopathy. Lungs:  Clear throughout to auscultation.   No wheezes, crackles, or rhonchi. No acute distress. Heart:  Regular rate and rhythm; no murmurs, clicks, rubs,  or gallops. Abdomen: Non-distended, normal bowel sounds.  Soft and nontender without appreciable mass or hepatosplenomegaly.  Pulses:  Normal pulses noted. Extremities:  Without clubbing or edema. Rectal:  Marked redness of the perianal skin. Digital rectal exam extremely painful for the patient. Incomplete exam performed. Tenderness localized to the left side. No fluctuance no discharge no evidence of abscess. Scant brown stools Hemoccult negative.  Impression:   Pleasant 66 year old with stage IV rectal cancer having issues with hyper defecation. Redness and proctalgia.  He likely has multiple issues i.e. tinea skin infection perianally, likely anal fissure. It may be suffering the side effects of med Shanda Bumps in the way of hyper defecation.  An occult GI pathogen may also be playing a role and needs  to be considered here.   I don't see a need to urgently pursue surveillance colonoscopy at this time. However, he may need endoscopic evaluation of his bowel symptoms if they don't improve in the near future.  GERD well controlled on omeprazole.    Recommendations:  I'm glad to hear the dose of metformin is being decreased.  Stop fiber supplement  Stop Desitin  Use NTG ointment to anorectum 4x daily  GI pathogen panel  with Cdiff stool sample  Use miconazole nitrate cream to irritated anal area 4x daily in between NTG applications  Begin Probiotic like Align daily  May continue omeprazole 20 mg daily  You may or may not need another colonoscopy in the near future depending on how you do  Office visit in 1 month        Notice: This dictation was prepared with Dragon dictation along with smaller phrase technology. Any transcriptional errors that result from this process are unintentional and may not be corrected upon review.

## 2016-10-01 LAB — CEA: CEA: 2.2 ng/mL (ref 0.0–4.7)

## 2016-10-02 ENCOUNTER — Encounter (HOSPITAL_BASED_OUTPATIENT_CLINIC_OR_DEPARTMENT_OTHER): Payer: Medicare Other

## 2016-10-02 ENCOUNTER — Encounter (HOSPITAL_COMMUNITY): Payer: Self-pay

## 2016-10-02 VITALS — BP 166/87 | HR 78 | Temp 97.8°F | Resp 18

## 2016-10-02 DIAGNOSIS — R197 Diarrhea, unspecified: Secondary | ICD-10-CM | POA: Diagnosis not present

## 2016-10-02 DIAGNOSIS — C20 Malignant neoplasm of rectum: Secondary | ICD-10-CM | POA: Diagnosis not present

## 2016-10-02 DIAGNOSIS — Z452 Encounter for adjustment and management of vascular access device: Secondary | ICD-10-CM

## 2016-10-02 MED ORDER — SODIUM CHLORIDE 0.9% FLUSH
10.0000 mL | INTRAVENOUS | Status: DC | PRN
  Administered 2016-10-02: 10 mL
  Filled 2016-10-02: qty 10

## 2016-10-02 MED ORDER — HEPARIN SOD (PORK) LOCK FLUSH 100 UNIT/ML IV SOLN
INTRAVENOUS | Status: AC
  Filled 2016-10-02: qty 5

## 2016-10-02 MED ORDER — HEPARIN SOD (PORK) LOCK FLUSH 100 UNIT/ML IV SOLN
500.0000 [IU] | Freq: Once | INTRAVENOUS | Status: AC | PRN
  Administered 2016-10-02: 500 [IU]

## 2016-10-02 NOTE — Progress Notes (Signed)
Charles [name] presents to have home infusion pump d/c'd and for port-a-cath deaccess with flush.  Portacath located right chest wall accessed with  H 20 needle.  Good blood return present. Portacath flushed with NS and 500U/68ml Heparin, and needle removed intact.  Procedure tolerated well and without incident. Discharged ambulatory.

## 2016-10-02 NOTE — Patient Instructions (Signed)
[place] at [facility] Discharge Instructions  RECOMMENDATIONS MADE BY THE CONSULTANT AND ANY TEST RESULTS WILL BE SENT TO YOUR REFERRING PHYSICIAN.  Port flush and pump removal today. Return as scheduled.   Thank you for choosing [place] at [facility] to provide your oncology and hematology care.  To afford each patient quality time with our provider, please arrive at least 15 minutes before your scheduled appointment time.    If you have a lab appointment with the [place] please come in thru the  Main Entrance and check in at the main information desk  You need to re-schedule your appointment should you arrive 10 or more minutes late.  We strive to give you quality time with our providers, and arriving late affects you and other patients whose appointments are after yours.  Also, if you no show three or more times for appointments you may be dismissed from the clinic at the providers discretion.     Again, thank you for choosing [facility].  Our hope is that these requests will decrease the amount of time that you wait before being seen by our physicians.       _____________________________________________________________  Should you have questions after your visit to [facility], please contact our office at [phone] between the hours of 8:30 a.m. and 4:30 p.m.  Voicemails left after 4:30 p.m. will not be returned until the following business day.  For prescription refill requests, have your pharmacy contact our office.       Resources For Cancer Patients and their Caregivers ? American Cancer Society: Can assist with transportation, wigs, general needs, runs Look Good Feel Better.        9316260617 ? Cancer Care: Provides financial assistance, online support groups, medication/co-pay assistance.  1-800-813-HOPE 903-690-8361) ? [place] Assists [place] cancer  patients and their families through emotional , educational and financial support.  250-326-2121 ? [place] DSS Where to apply for food stamps, Medicaid and utility assistance. [phone] 682-6789 ? RCATS: Transportation to medical appointments. 220-111-5108 ? Social Security Administration: May apply for disability if have a Stage IV cancer. [phone] 732-289-3711 ? [employer], Disability and Transit Services: Assists with nutrition, care and transit needs. [place] Support Programs: @10RELATIVEDAYS @ > Cancer Support Group  2nd Tuesday of the month 1pm-2pm, Journey Room  > Creative Journey  3rd Tuesday of the month 1130am-1pm, Journey Room  > Look Good Feel Better  1st Wednesday of the month 10am-12 noon, Journey Room (Call [place] to register [phone])

## 2016-10-03 LAB — C. DIFFICILE GDH AND TOXIN A/B
C. difficile GDH: NOT DETECTED
C. difficile Toxin A/B: NOT DETECTED

## 2016-10-03 NOTE — Addendum Note (Signed)
Addendum  created 10/03/16 1130 by Rica Koyanagi, MD   Sign clinical note

## 2016-10-03 NOTE — Addendum Note (Signed)
Addendum  created 10/03/16 1131 by Rica Koyanagi, MD   Sign clinical note

## 2016-10-06 LAB — GASTROINTESTINAL PATHOGEN PANEL PCR
C. difficile Tox A/B, PCR: NOT DETECTED
Campylobacter, PCR: NOT DETECTED
Cryptosporidium, PCR: NOT DETECTED
E coli (ETEC) LT/ST PCR: NOT DETECTED
E coli (STEC) stx1/stx2, PCR: NOT DETECTED
E coli 0157, PCR: NOT DETECTED
Giardia lamblia, PCR: NOT DETECTED
Norovirus, PCR: NOT DETECTED
Rotavirus A, PCR: NOT DETECTED
Salmonella, PCR: NOT DETECTED
Shigella, PCR: NOT DETECTED

## 2016-10-14 ENCOUNTER — Encounter (HOSPITAL_COMMUNITY): Payer: Medicare Other | Attending: Oncology

## 2016-10-14 ENCOUNTER — Encounter (HOSPITAL_COMMUNITY): Payer: Self-pay

## 2016-10-14 VITALS — BP 152/82 | HR 70 | Temp 97.9°F | Resp 18 | Wt 208.4 lb

## 2016-10-14 DIAGNOSIS — Z5111 Encounter for antineoplastic chemotherapy: Secondary | ICD-10-CM | POA: Diagnosis not present

## 2016-10-14 DIAGNOSIS — C20 Malignant neoplasm of rectum: Secondary | ICD-10-CM | POA: Diagnosis not present

## 2016-10-14 DIAGNOSIS — Z5112 Encounter for antineoplastic immunotherapy: Secondary | ICD-10-CM

## 2016-10-14 LAB — URINALYSIS, DIPSTICK ONLY
Bilirubin Urine: NEGATIVE
Glucose, UA: NEGATIVE mg/dL
Hgb urine dipstick: NEGATIVE
Ketones, ur: NEGATIVE mg/dL
Leukocytes, UA: NEGATIVE
Nitrite: NEGATIVE
Protein, ur: NEGATIVE mg/dL
Specific Gravity, Urine: >1.03 — ABNORMAL HIGH (ref 1.005–1.030)
pH: 6 (ref 5.0–8.0)

## 2016-10-14 LAB — CBC WITH DIFFERENTIAL/PLATELET
Basophils Absolute: 0 10*3/uL (ref 0.0–0.1)
Basophils Relative: 1 %
Eosinophils Absolute: 0.1 10*3/uL (ref 0.0–0.7)
Eosinophils Relative: 3 %
HCT: 36.3 % — ABNORMAL LOW (ref 39.0–52.0)
Hemoglobin: 12 g/dL — ABNORMAL LOW (ref 13.0–17.0)
Lymphocytes Relative: 32 %
Lymphs Abs: 1.4 10*3/uL (ref 0.7–4.0)
MCH: 30.3 pg (ref 26.0–34.0)
MCHC: 33.1 g/dL (ref 30.0–36.0)
MCV: 91.7 fL (ref 78.0–100.0)
Monocytes Absolute: 0.5 10*3/uL (ref 0.1–1.0)
Monocytes Relative: 11 %
Neutro Abs: 2.4 10*3/uL (ref 1.7–7.7)
Neutrophils Relative %: 53 %
Platelets: 135 10*3/uL — ABNORMAL LOW (ref 150–400)
RBC: 3.96 MIL/uL — ABNORMAL LOW (ref 4.22–5.81)
RDW: 14.6 % (ref 11.5–15.5)
WBC: 4.4 10*3/uL (ref 4.0–10.5)

## 2016-10-14 LAB — COMPREHENSIVE METABOLIC PANEL
ALT: 18 U/L (ref 17–63)
AST: 21 U/L (ref 15–41)
Albumin: 3.1 g/dL — ABNORMAL LOW (ref 3.5–5.0)
Alkaline Phosphatase: 69 U/L (ref 38–126)
Anion gap: 7 (ref 5–15)
BUN: 9 mg/dL (ref 6–20)
CO2: 25 mmol/L (ref 22–32)
Calcium: 8.5 mg/dL — ABNORMAL LOW (ref 8.9–10.3)
Chloride: 107 mmol/L (ref 101–111)
Creatinine, Ser: 0.58 mg/dL — ABNORMAL LOW (ref 0.61–1.24)
GFR calc Af Amer: >60 mL/min (ref >60)
GFR calc non Af Amer: >60 mL/min (ref >60)
Glucose, Bld: 96 mg/dL (ref 65–99)
Potassium: 3.8 mmol/L (ref 3.5–5.1)
Sodium: 139 mmol/L (ref 135–145)
Total Bilirubin: 0.9 mg/dL (ref 0.3–1.2)
Total Protein: 5.6 g/dL — ABNORMAL LOW (ref 6.5–8.1)

## 2016-10-14 MED ORDER — DEXAMETHASONE SODIUM PHOSPHATE 100 MG/10ML IJ SOLN
10.0000 mg | Freq: Once | INTRAMUSCULAR | Status: DC
Start: 2016-10-14 — End: 2016-10-14

## 2016-10-14 MED ORDER — SODIUM CHLORIDE 0.9 % IV SOLN
5.4000 mg/kg | Freq: Once | INTRAVENOUS | Status: AC
  Administered 2016-10-14: 500 mg via INTRAVENOUS
  Filled 2016-10-14: qty 16

## 2016-10-14 MED ORDER — FLUOROURACIL CHEMO INJECTION 2.5 GM/50ML
400.0000 mg/m2 | Freq: Once | INTRAVENOUS | Status: AC
  Administered 2016-10-14: 900 mg via INTRAVENOUS
  Filled 2016-10-14: qty 18

## 2016-10-14 MED ORDER — SODIUM CHLORIDE 0.9 % IV SOLN
2400.0000 mg/m2 | INTRAVENOUS | Status: DC
  Administered 2016-10-14: 5300 mg via INTRAVENOUS
  Filled 2016-10-14: qty 100

## 2016-10-14 MED ORDER — SODIUM CHLORIDE 0.9% FLUSH
10.0000 mL | INTRAVENOUS | Status: DC | PRN
  Administered 2016-10-14: 10 mL
  Filled 2016-10-14: qty 10

## 2016-10-14 MED ORDER — DIPHENHYDRAMINE HCL 50 MG/ML IJ SOLN
50.0000 mg | Freq: Once | INTRAMUSCULAR | Status: AC
  Administered 2016-10-14: 50 mg via INTRAVENOUS
  Filled 2016-10-14: qty 1

## 2016-10-14 MED ORDER — SODIUM CHLORIDE 0.9 % IV SOLN
INTRAVENOUS | Status: DC
  Administered 2016-10-14: 10:00:00 via INTRAVENOUS

## 2016-10-14 MED ORDER — DEXTROSE 5 % IV SOLN
Freq: Once | INTRAVENOUS | Status: AC
  Administered 2016-10-14: 12:00:00 via INTRAVENOUS

## 2016-10-14 MED ORDER — PALONOSETRON HCL INJECTION 0.25 MG/5ML
0.2500 mg | Freq: Once | INTRAVENOUS | Status: AC
  Administered 2016-10-14: 0.25 mg via INTRAVENOUS
  Filled 2016-10-14: qty 5

## 2016-10-14 MED ORDER — LEUCOVORIN CALCIUM INJECTION 350 MG
400.0000 mg/m2 | Freq: Once | INTRAVENOUS | Status: AC
  Administered 2016-10-14: 880 mg via INTRAVENOUS
  Filled 2016-10-14: qty 39

## 2016-10-14 MED ORDER — OXALIPLATIN CHEMO INJECTION 100 MG/20ML
85.0000 mg/m2 | Freq: Once | INTRAVENOUS | Status: AC
  Administered 2016-10-14: 185 mg via INTRAVENOUS
  Filled 2016-10-14: qty 37

## 2016-10-14 MED ORDER — DEXAMETHASONE SODIUM PHOSPHATE 10 MG/ML IJ SOLN
10.0000 mg | Freq: Once | INTRAMUSCULAR | Status: AC
  Administered 2016-10-14: 10 mg via INTRAVENOUS
  Filled 2016-10-14: qty 1

## 2016-10-14 NOTE — Progress Notes (Signed)
Charles [name] tolerated chemo tx well without complaints or incident. Labs and urine protein results reviewed with Dr. Oliva Bustard prior to administering chemotherapy. VSS upon discharge. Pt discharged with 5FU pump infusing without issues. Pt discharged self ambulatory in satisfactory condition accompanied by his wife

## 2016-10-14 NOTE — Patient Instructions (Signed)
[facility] Discharge Instructions for Patients Receiving Chemotherapy   Beginning January 23rd 2017 lab work for the [facility] will be done in the  Main lab at [facility] on 1st floor. If you have a lab appointment with the [place] please come in thru the  Main Entrance and check in at the main information desk   Today you received the following chemotherapy agents Avastin,Oxaliplatin, Leucovorin and 5FU.Follow-up as scheduled. Call clinic for any questions or concerns  To help prevent nausea and vomiting after your treatment, we encourage you to take your nausea medication   If you develop nausea and vomiting, or diarrhea that is not controlled by your medication, call the clinic.  The clinic phone number is [phone]. Office hours are Monday-Friday 8:30am-5:00pm.  BELOW ARE SYMPTOMS THAT SHOULD BE REPORTED IMMEDIATELY:  *FEVER GREATER THAN 101.0 F  *CHILLS WITH OR WITHOUT FEVER  NAUSEA AND VOMITING THAT IS NOT CONTROLLED WITH YOUR NAUSEA MEDICATION  *UNUSUAL SHORTNESS OF BREATH  *UNUSUAL BRUISING OR BLEEDING  TENDERNESS IN MOUTH AND THROAT WITH OR WITHOUT PRESENCE OF ULCERS  *URINARY PROBLEMS  *BOWEL PROBLEMS  UNUSUAL RASH Items with * indicate a potential emergency and should be followed up as soon as possible. If you have an emergency after office hours please contact your primary care physician or go to the nearest emergency department.  Please call the clinic during office hours if you have any questions or concerns.   You may also contact the Patient Navigator at [phone] should you have any questions or need assistance in obtaining follow up care.      Resources For Cancer Patients and their Caregivers ? American Cancer Society: Can assist with transportation, wigs, general needs, runs Look Good Feel Better.        419-883-4961 ? Cancer Care: Provides financial assistance, online support groups, medication/co-pay  assistance.  1-800-813-HOPE 781-632-8770) ? [place] Assists [place] cancer patients and their families through emotional , educational and financial support.  905-559-8246 ? [place] DSS Where to apply for food stamps, Medicaid and utility assistance. (484)238-0699 ? RCATS: Transportation to medical appointments. ([phone] ? Social Security Administration: May apply for disability if have a Stage IV cancer. 337-247-5658 715-372-1201 ? [employer], Disability and Transit Services: Assists with nutrition, care and transit needs. [phone]

## 2016-10-16 ENCOUNTER — Encounter (HOSPITAL_COMMUNITY): Payer: Self-pay

## 2016-10-16 ENCOUNTER — Encounter (HOSPITAL_BASED_OUTPATIENT_CLINIC_OR_DEPARTMENT_OTHER): Payer: Medicare Other

## 2016-10-16 VITALS — BP 150/85 | HR 79 | Temp 98.1°F | Resp 18

## 2016-10-16 DIAGNOSIS — C20 Malignant neoplasm of rectum: Secondary | ICD-10-CM

## 2016-10-16 DIAGNOSIS — Z452 Encounter for adjustment and management of vascular access device: Secondary | ICD-10-CM

## 2016-10-16 MED ORDER — SODIUM CHLORIDE 0.9% FLUSH
10.0000 mL | INTRAVENOUS | Status: DC | PRN
  Administered 2016-10-16: 10 mL
  Filled 2016-10-16: qty 10

## 2016-10-16 MED ORDER — HEPARIN SOD (PORK) LOCK FLUSH 100 UNIT/ML IV SOLN
500.0000 [IU] | Freq: Once | INTRAVENOUS | Status: AC | PRN
  Administered 2016-10-16: 500 [IU]
  Filled 2016-10-16: qty 5

## 2016-10-16 NOTE — Progress Notes (Signed)
Charles [name] tolerated discontinuation of 5FU pump with port flush well without complaints or incident. Port flushed with 10 ml NS and 5 ml Heparin easily per protocol then de-accessed. VSS Pt discharged self ambulatory in satisfactory condition accompanied by his wife

## 2016-10-16 NOTE — Patient Instructions (Signed)
[place] at [facility] Discharge Instructions  RECOMMENDATIONS MADE BY THE CONSULTANT AND ANY TEST RESULTS WILL BE SENT TO YOUR REFERRING PHYSICIAN.  5FU pump discontinued with port flushed per protocol today. Follow-up as scheduled. Call clinic for any questions or concerns  Thank you for choosing [place] at [facility] to provide your oncology and hematology care.  To afford each patient quality time with our provider, please arrive at least 15 minutes before your scheduled appointment time.    If you have a lab appointment with the [facility] please come in thru the  Main Entrance and check in at the main information desk  You need to re-schedule your appointment should you arrive 10 or more minutes late.  We strive to give you quality time with our providers, and arriving late affects you and other patients whose appointments are after yours.  Also, if you no show three or more times for appointments you may be dismissed from the clinic at the providers discretion.     Again, thank you for choosing [facility]  Our hope is that these requests will decrease the amount of time that you wait before being seen by our physicians.       _____________________________________________________________  Should you have questions after your visit to [facility], please contact our office at [phone] between the hours of 8:30 a.m. and 4:30 p.m.  Voicemails left after 4:30 p.m. will not be returned until the following business day.  For prescription refill requests, have your pharmacy contact our office.       Resources For Cancer Patients and their Caregivers ? American [employer] Society: Can assist with transportation, wigs, general needs, runs Look Good Feel Better.        (437)260-3636 ? Cancer Care: Provides financial assistance, online support groups, medication/co-pay assistance.  1-800-813-HOPE  (810) 652-8831) ? [place] Assists [place] cancer patients and their families through emotional , educational and financial support.  406-485-9597 ? [place] DSS Where to apply for food stamps, Medicaid and utility assistance. 8130642102 ? [employer]: Transportation to medical appointments. (339)067-2550 ? Social Security Administration: May apply for disability if have a Stage IV cancer. (249)257-4393 432-016-6527 ? [employer], Disability and Transit Services: Assists with nutrition, care and transit needs. [place] Support Programs: @10RELATIVEDAYS @ > Cancer Support Group  2nd Tuesday of the month 1pm-2pm, Journey Room  > Creative Journey  3rd Tuesday of the month 1130am-1pm, Journey Room  > Look Good Feel Better  1st Wednesday of the month 10am-12 noon, Journey Room (Call [place] to register [phone])

## 2016-10-24 ENCOUNTER — Telehealth: Payer: Self-pay

## 2016-10-24 NOTE — Telephone Encounter (Signed)
Communication noted.  

## 2016-10-24 NOTE — Telephone Encounter (Signed)
noted 

## 2016-10-24 NOTE — Telephone Encounter (Signed)
T/C from pharmacist at [facility] asking about the prescription Dr. [name] wrote on 09/30/2016 for the Nitroglycerin ointment. He had written to take qid but did not specify quantity. I spoke to Neil Crouch, PA and she said OK to say quantity sufficient for one month if they would accept. That was acceptable.

## 2016-10-28 ENCOUNTER — Encounter (HOSPITAL_COMMUNITY): Payer: Self-pay | Admitting: Adult Health

## 2016-10-28 ENCOUNTER — Encounter (HOSPITAL_BASED_OUTPATIENT_CLINIC_OR_DEPARTMENT_OTHER): Payer: Medicare Other | Admitting: Adult Health

## 2016-10-28 ENCOUNTER — Encounter (HOSPITAL_BASED_OUTPATIENT_CLINIC_OR_DEPARTMENT_OTHER): Payer: Medicare Other

## 2016-10-28 VITALS — BP 162/82 | HR 78 | Temp 98.5°F | Resp 18 | Ht 76.0 in | Wt 208.6 lb

## 2016-10-28 DIAGNOSIS — C7800 Secondary malignant neoplasm of unspecified lung: Secondary | ICD-10-CM

## 2016-10-28 DIAGNOSIS — C20 Malignant neoplasm of rectum: Secondary | ICD-10-CM

## 2016-10-28 DIAGNOSIS — Z5112 Encounter for antineoplastic immunotherapy: Secondary | ICD-10-CM | POA: Diagnosis not present

## 2016-10-28 DIAGNOSIS — C7801 Secondary malignant neoplasm of right lung: Secondary | ICD-10-CM

## 2016-10-28 DIAGNOSIS — C7889 Secondary malignant neoplasm of other digestive organs: Secondary | ICD-10-CM

## 2016-10-28 DIAGNOSIS — K6289 Other specified diseases of anus and rectum: Secondary | ICD-10-CM

## 2016-10-28 DIAGNOSIS — E876 Hypokalemia: Secondary | ICD-10-CM

## 2016-10-28 DIAGNOSIS — G893 Neoplasm related pain (acute) (chronic): Secondary | ICD-10-CM | POA: Diagnosis not present

## 2016-10-28 DIAGNOSIS — Z5111 Encounter for antineoplastic chemotherapy: Secondary | ICD-10-CM

## 2016-10-28 LAB — URINALYSIS, DIPSTICK ONLY
Bilirubin Urine: NEGATIVE
Glucose, UA: NEGATIVE mg/dL
Hgb urine dipstick: NEGATIVE
Ketones, ur: NEGATIVE mg/dL
Leukocytes, UA: NEGATIVE
Nitrite: NEGATIVE
Protein, ur: NEGATIVE mg/dL
Specific Gravity, Urine: 1.025 (ref 1.005–1.030)
pH: 6 (ref 5.0–8.0)

## 2016-10-28 LAB — COMPREHENSIVE METABOLIC PANEL
ALT: 21 U/L (ref 17–63)
AST: 25 U/L (ref 15–41)
Albumin: 3.4 g/dL — ABNORMAL LOW (ref 3.5–5.0)
Alkaline Phosphatase: 70 U/L (ref 38–126)
Anion gap: 7 (ref 5–15)
BUN: 8 mg/dL (ref 6–20)
CO2: 24 mmol/L (ref 22–32)
Calcium: 8.1 mg/dL — ABNORMAL LOW (ref 8.9–10.3)
Chloride: 108 mmol/L (ref 101–111)
Creatinine, Ser: 0.63 mg/dL (ref 0.61–1.24)
GFR calc Af Amer: >60 mL/min (ref >60)
GFR calc non Af Amer: >60 mL/min (ref >60)
Glucose, Bld: 181 mg/dL — ABNORMAL HIGH (ref 65–99)
Potassium: 3.3 mmol/L — ABNORMAL LOW (ref 3.5–5.1)
Sodium: 139 mmol/L (ref 135–145)
Total Bilirubin: 1 mg/dL (ref 0.3–1.2)
Total Protein: 5.8 g/dL — ABNORMAL LOW (ref 6.5–8.1)

## 2016-10-28 LAB — CBC WITH DIFFERENTIAL/PLATELET
Basophils Absolute: 0 10*3/uL (ref 0.0–0.1)
Basophils Relative: 1 %
Eosinophils Absolute: 0.1 10*3/uL (ref 0.0–0.7)
Eosinophils Relative: 2 %
HCT: 38.3 % — ABNORMAL LOW (ref 39.0–52.0)
Hemoglobin: 12.7 g/dL — ABNORMAL LOW (ref 13.0–17.0)
Lymphocytes Relative: 33 %
Lymphs Abs: 1.4 10*3/uL (ref 0.7–4.0)
MCH: 29.8 pg (ref 26.0–34.0)
MCHC: 33.2 g/dL (ref 30.0–36.0)
MCV: 89.9 fL (ref 78.0–100.0)
Monocytes Absolute: 0.4 10*3/uL (ref 0.1–1.0)
Monocytes Relative: 9 %
Neutro Abs: 2.4 10*3/uL (ref 1.7–7.7)
Neutrophils Relative %: 55 %
Platelets: 129 10*3/uL — ABNORMAL LOW (ref 150–400)
RBC: 4.26 MIL/uL (ref 4.22–5.81)
RDW: 14.7 % (ref 11.5–15.5)
WBC: 4.4 10*3/uL (ref 4.0–10.5)

## 2016-10-28 MED ORDER — PALONOSETRON HCL INJECTION 0.25 MG/5ML
0.2500 mg | Freq: Once | INTRAVENOUS | Status: AC
  Administered 2016-10-28: 0.25 mg via INTRAVENOUS
  Filled 2016-10-28: qty 5

## 2016-10-28 MED ORDER — SODIUM CHLORIDE 0.9 % IV SOLN
INTRAVENOUS | Status: DC
  Administered 2016-10-28: 11:00:00 via INTRAVENOUS

## 2016-10-28 MED ORDER — DEXTROSE 5 % IV SOLN
85.0000 mg/m2 | Freq: Once | INTRAVENOUS | Status: AC
  Administered 2016-10-28: 185 mg via INTRAVENOUS
  Filled 2016-10-28: qty 37

## 2016-10-28 MED ORDER — FENTANYL 12 MCG/HR TD PT72: 12.5000 ug | MEDICATED_PATCH | TRANSDERMAL | 0 refills | Status: DC

## 2016-10-28 MED ORDER — DIPHENHYDRAMINE HCL 50 MG/ML IJ SOLN
50.0000 mg | Freq: Once | INTRAMUSCULAR | Status: AC
  Administered 2016-10-28: 50 mg via INTRAVENOUS
  Filled 2016-10-28: qty 1

## 2016-10-28 MED ORDER — SODIUM CHLORIDE 0.9 % IV SOLN
2400.0000 mg/m2 | INTRAVENOUS | Status: DC
  Administered 2016-10-28: 5300 mg via INTRAVENOUS
  Filled 2016-10-28: qty 106

## 2016-10-28 MED ORDER — SODIUM CHLORIDE 0.9 % IV SOLN: 10.0000 mg | Freq: Once | INTRAVENOUS | Status: DC

## 2016-10-28 MED ORDER — LEUCOVORIN CALCIUM INJECTION 350 MG
400.0000 mg/m2 | Freq: Once | INTRAVENOUS | Status: AC
  Administered 2016-10-28: 880 mg via INTRAVENOUS
  Filled 2016-10-28: qty 35

## 2016-10-28 MED ORDER — FLUOROURACIL CHEMO INJECTION 2.5 GM/50ML
400.0000 mg/m2 | Freq: Once | INTRAVENOUS | Status: AC
  Administered 2016-10-28: 900 mg via INTRAVENOUS
  Filled 2016-10-28: qty 18

## 2016-10-28 MED ORDER — DEXAMETHASONE SODIUM PHOSPHATE 10 MG/ML IJ SOLN
10.0000 mg | Freq: Once | INTRAMUSCULAR | Status: AC
  Administered 2016-10-28: 10 mg via INTRAVENOUS
  Filled 2016-10-28: qty 1

## 2016-10-28 MED ORDER — SODIUM CHLORIDE 0.9 % IV SOLN
5.2500 mg/kg | Freq: Once | INTRAVENOUS | Status: AC
  Administered 2016-10-28: 500 mg via INTRAVENOUS
  Filled 2016-10-28: qty 16

## 2016-10-28 MED ORDER — HYDROCODONE-ACETAMINOPHEN 5-325 MG PO TABS: 1.0000 | ORAL_TABLET | Freq: Four times a day (QID) | ORAL | 0 refills | Status: DC | PRN

## 2016-10-28 MED ORDER — POTASSIUM CHLORIDE CRYS ER 20 MEQ PO TBCR
40.0000 meq | EXTENDED_RELEASE_TABLET | Freq: Once | ORAL | Status: AC
  Administered 2016-10-28: 40 meq via ORAL
  Filled 2016-10-28: qty 2

## 2016-10-28 MED ORDER — SODIUM CHLORIDE 0.9% FLUSH
10.0000 mL | INTRAVENOUS | Status: DC | PRN
  Administered 2016-10-28: 10 mL
  Filled 2016-10-28: qty 10

## 2016-10-28 MED ORDER — DEXTROSE 5 % IV SOLN
Freq: Once | INTRAVENOUS | Status: AC
  Administered 2016-10-28: 13:00:00 via INTRAVENOUS

## 2016-10-28 NOTE — Progress Notes (Signed)
Charles [name] tolerated chemo tx well without complaints or incident. Labs and urine protein reviewed with Dr. Talbert Cage prior to administering chemotherapy and K-dur 40 meq ordered PO per MD. Pt discharged with 5FU pump infusing without issues. VSS upon discharge. Pt discharged self ambulatory in satisfactory condition accompanied by his wife

## 2016-10-28 NOTE — Progress Notes (Signed)
Charles [name], [place] [id number]   CLINIC:  Medical Oncology/Hematology  PCP:  [name], MD [place] [id number] [phone]   REASON FOR VISIT:  Follow-up for Stage IVA adenocarcinoma of rectum with lung and pancreatic mets  CURRENT THERAPY: FOLFOX/Avastin, beginning 07/08/16   BRIEF ONCOLOGIC HISTORY:    Rectal cancer ([place])   07/24/2010 Initial Diagnosis    Invasive adenocarcinoma of rectum      09/09/2010 Concurrent Chemotherapy    S/P radiation and concurrent 5-FU continuous infusion from 09/09/10- 10/10/10.      11/14/2010 Surgery    S/P proctectomy with colorectal anastomosis and diverting loop ileostomy on 11/14/10 at [facility] by Dr. Harlon Ditty. Pathology reveals a pT3b N1 with 3/20 lymph nodes.      02/05/2011 - 07/14/2011 Chemotherapy    FOLFOX      08/18/2011 Surgery    Approximate date of surgery- [place] by Dr. Harlon Ditty       Remission         11/24/2013 Survivorship    Colonoscopy- somewhat fibrotic/friable anastomotic mucosal-status post biopsy (narrowing not felt to be clinically significant).  Negative pathology for malignancy      09/12/2014 Imaging    DVT in the left femoral venous system, left common iliac vein, IVC, and within the IVC filter Right lower extremity venography confirms chronic occlusion of the femoral venous system with collateralization. The right iliac venous system is patent and do      09/25/2014 PET scan    The right middle lobe pulmonary nodule is hypermetabolic, favored to represent a primary bronchogenic carcinoma.Equivocal mediastinal nodes, similar to surrounding blood pool. Bilateral adrenal hypermetabolism, felt to be physiologic      10/24/2014 Pathology Results    Lung, needle/core biopsy(ies), RML - ADENOCARCINOMA, SEE COMMENT metastatic adenocarcinoma of a colorectal primary      10/24/2014 Relapse/Recurrence         11/16/2014 Definitive Surgery    Bronchoscopy,  right video-assisted thoracoscopy, wedge resection of right middle lobe by Dr. Servando Snare      11/16/2014 Pathology Results    Lung, wedge biopsy/resection, right lingula and small portion of middle lobe - METASTATIC ADENOCARCINOMA, CONSISTENT WITH COLORECTAL PRIMARY, SPANNING 2.0 CM. - THE SURGICAL RESECTION MARGINS ARE NEGATIVE FOR ADENOCARCINOMA.      11/16/2014 Remission    THE SURGICAL RESECTION MARGINS ARE NEGATIVE FOR ADENOCARCINOMA.      03/07/2015 Imaging    CT CAP- Interval resection of right middle lobe metastasis. No acute process or evidence of metastatic disease in the chest, abdomen or pelvis. Improved right upper lobe reticular nodular opacities are favored to represent resolving infection.      09/14/2015 Imaging    CT CAP- No findings of recurrent malignancy. No recurrence along the wedge resection site of the right middle lobe. Right anterior abdominal wall focal hernia containing a knuckle of small bowel without complicating feature.      06/13/2016 Imaging    CT CAP- 1. New right-sided pleural metastasis/mass. Other smaller right-sided pulmonary nodules which are all pleural-based and most likely represent pleural metastasis. 2. No convincing evidence of abdominopelvic nodal metastasis. 3. Constellation of findings, including pancreatic atrophy, duct dilatation, and pancreatic head soft tissue fullness which are highly suspicious for pancreatic adenocarcinoma. Metastatic disease felt much less likely. Consider endoscopic ultrasound sampling or ERCP. Cannot exclude superimposed acute pancreatitis. 4. New enlargement of the appendiceal tip with subtle surrounding edema. Cannot  exclude early or mild appendicitis. 5. Coronary artery atherosclerosis. Aortic atherosclerosis. 6. 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      06/13/2016 Progression    CT scan demonstrates progression of disease      06/19/2016 Procedure    EUS with FNA by Dr.  Ardis Hughs      06/20/2016 Pathology Results    FINE NEEDLE ASPIRATION, ENDOSCOPIC, PANCREAS UNCINATE AREA(SPECIMEN 1 OF 1 COLLECTED 06/19/16): MALIGNANT CELLS CONSISTENT WITH METASTATIC ADENOCARCINOMA.      06/25/2016 PET scan    1. Two pleural-based nodules in the right hemithorax are hypermetabolic. Metastatic disease is a distinct consideration. The scattered pulmonary parenchymal nodule seen on previous diagnostic CT imaging are below the threshold for reliable resolution on PET imaging. 2. Hypermetabolic lesion pancreatic head, consistent with neoplasm. As noted on prior CT, adenocarcinoma is any consideration. No evidence for hypermetabolic abdominal lymphadenopathy. Mottled uptake noted in the liver, but no discrete hepatic metastases are evident on PET imaging. 3. Appendix remains distended up to 0.9-10 mm diameter with a stone towards the tip in subtle periappendiceal edema/inflammation. The appendix is hypermetabolic along its length. Imaging features are relatively stable in the 12 day interval since prior CT scan. While appendicitis is a consideration, the relative stability over 12 days would be unusual for that etiology. Continued close follow-up recommended.      06/26/2016 Pathology Results    Not enough tissue for foundationONE or K-ras testing.      07/07/2016 Procedure    Port placed by Dr. Arnoldo Morale      07/08/2016 -  Chemotherapy    The patient had pegfilgrastim (NEULASTA) injection 6 mg, 6 mg, Subcutaneous,  Once, 0 of 4 cycles  ondansetron (ZOFRAN) IVPB 8 mg, 8 mg, Intravenous,  Once, 0 of 4 cycles  leucovorin 804 mg in dextrose 5 % 250 mL infusion, 400 mg/m2, Intravenous,  Once, 0 of 4 cycles  oxaliplatin (ELOXATIN) 170 mg in dextrose 5 % 500 mL chemo infusion, 85 mg/m2, Intravenous,  Once, 0 of 4 cycles  fluorouracil (ADRUCIL) 4,800 mg in sodium chloride 0.9 % 150 mL chemo infusion, 2,400 mg/m2 = 4,800 mg, Intravenous, 1 Day/Dose, 0 of 4 cycles  fluorouracil  (ADRUCIL) chemo injection 800 mg, 400 mg/m2, Intravenous,  Once, 0 of 4 cycles  pegfilgrastim (NEULASTA) injection 6 mg, 6 mg, Subcutaneous,  Once, 2 of 2 cycles  ondansetron (ZOFRAN) IVPB 8 mg, 8 mg, Intravenous,  Once, 12 of 12 cycles  leucovorin 660 mg in dextrose 5 % 250 mL infusion, 660 mg (original dose ), Intravenous,  Once, 12 of 12 cycles Dose modification: 660 mg (Cycle 1)  oxaliplatin (ELOXATIN) 140 mg in dextrose 5 % 500 mL chemo infusion, 140 mg (original dose ), Intravenous,  Once, 12 of 12 cycles Dose modification: 140 mg (Cycle 1), 119 mg (85 % of original dose 140 mg, Cycle 8, Reason: Provider Judgment)  fluorouracil (ADRUCIL) 3,850 mg in sodium chloride 0.9 % 150 mL chemo infusion, 3,850 mg (original dose ), Intravenous, 1 Day/Dose, 12 of 12 cycles Dose modification: 3,850 mg (Cycle 1)  fluorouracil (ADRUCIL) chemo injection 700 mg, 700 mg (original dose ), Intravenous,  Once, 12 of 12 cycles Dose modification: 700 mg (Cycle 1)  palonosetron (ALOXI) injection 0.25 mg, 0.25 mg, Intravenous,  Once, 7 of 12 cycles Administration: 0.25 mg (09/30/2016)  bevacizumab (AVASTIN) 475 mg in sodium chloride 0.9 % 100 mL chemo infusion, 5 mg/kg = 475 mg, Intravenous,  Once, 6 of 11  cycles Administration: 500 mg (09/30/2016)  leucovorin 880 mg in dextrose 5 % 250 mL infusion, 400 mg/m2 = 880 mg, Intravenous,  Once, 7 of 12 cycles Administration: 880 mg (09/30/2016)  oxaliplatin (ELOXATIN) 185 mg in dextrose 5 % 500 mL chemo infusion, 85 mg/m2 = 185 mg, Intravenous,  Once, 7 of 12 cycles Administration: 185 mg (09/30/2016)  fluorouracil (ADRUCIL) chemo injection 900 mg, 400 mg/m2 = 900 mg, Intravenous,  Once, 7 of 12 cycles Administration: 900 mg (09/30/2016)  fluorouracil (ADRUCIL) 5,300 mg in sodium chloride 0.9 % 144 mL chemo infusion, 2,400 mg/m2 = 5,300 mg, Intravenous, 1 Day/Dose, 7 of 12 cycles Administration: 5,300 mg (09/30/2016)  for chemotherapy treatment.         08/27/2016 Imaging    CT CAP- 1. Pulmonary metastatic disease is stable. 2. Pancreatic head mass, grossly stable, with progressive atrophy of the body and tail of the pancreas. Stable peripancreatic lymph node. 3. Mild circumferential rectal wall thickening, stable. 4. Aortic atherosclerosis (ICD10-170.0). Coronary artery calcification. 5. Hyperattenuating lesion off the lower pole left kidney, stable, too small to characterize. Continued attention on followup exams is warranted. 6. Persistent wall thickening and mild dilatation of the proximal appendix, of uncertain etiology. Continued attention on followup exams is warranted.         INTERVAL HISTORY:  Mr. Maute 66 y.o. male returns for follow-up for Stage IVA rectal cancer with lung and pancreatic mets; here for consideration of next cycle chemotherapy.   Due for cycle #9 FOLFOX/Avastin today.  Appetite and energy levels are both 75%.  The last few weeks have "been rough because of going to the bathroom all the time."  Endorses worsening rectal pain with frequent urge to defecate. Often times, his bowels only move "a little bit" with each trip to the bathroom, but reports increased pain because of frequent wiping with toilet tissue.  "If it wasn't for the pain, I would be okay. The chemo doesn't bother me at all."  Denies diarrhea or constipation.  He has an appointment to see Dr. [name] with GI tomorrow.  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  On his "off work weeks", he will take a Norco pill 1-2 times per day or at bedtime.  Also endorses frequent urination; his wife states "he just can't hold his water."  Denies any dysuria or hematuria.   Reports leg swelling, which is no worse than previous.  Endorses numbness/tingling to his fingers/toes/legs (which is stable). Neuropathy does not interfere  with his ADLs and he is still able to work.   Overall, he feels well enough to proceed with his next cycle of scheduled chemotherapy today.    REVIEW OF SYSTEMS:  Review of Systems  Constitutional: Positive for fatigue.  HENT:  Negative.   Eyes: Negative.   Cardiovascular: Positive for leg swelling.  Gastrointestinal: Positive for rectal pain. Negative for abdominal pain, blood in stool, constipation, diarrhea, nausea and vomiting.  Endocrine: Negative.   Genitourinary: Negative.  Negative for dysuria and hematuria.   Musculoskeletal: Negative.   Skin: Negative.  Negative for rash.  Neurological: Positive for numbness.  Hematological: Bruises/bleeds easily.  Psychiatric/Behavioral: Negative.      PAST MEDICAL/SURGICAL HISTORY:  Past Medical History:  Diagnosis Date  . Chronic anticoagulation   . Colon cancer ([place]) 07/24/2010   rectal ca, inv adenocarcinoma  .  Depression 04/01/2011  . Diabetes mellitus without complication ([place])   . DVT (deep venous thrombosis) ([place]) 05/09/2011  . GERD (gastroesophageal reflux disease)   . High output ileostomy ([place]) 05/21/2011  . History of kidney stones   . HTN (hypertension)   . Hx of radiation therapy 09/02/10 to 10/14/10   pelvis  . Lung metastasis ([place])   . Neuropathy   . Peripheral vascular disease ([place])    dvt's,pe  . Pneumonia    hx x3  . Pulmonary embolism ([place])   . Rectal cancer ([place]) 01/15/2011   S/P radiation and concurrent 5-FU continuous infusion from 09/09/10- 10/10/10.  S/P proctectomy with colorectal anastomosis and diverting loop ileostomy on 11/14/10 at [facility] by Dr. Harlon Ditty. Pathology reveals a pT3b N1 with 3/20 lymph nodes.     Past Surgical History:  Procedure Laterality Date  . COLON SURGERY  11/14/2010   proctectomy with colorectal anastomosis and diverting loop ileostomy (temporary planned)  . COLONOSCOPY  07/2010   proximal rectal apple core mass 10-14cm from anal verge (adenocarcinoma), 2-3cm distal rectal  carpet polyp s/p piecemeal snare polypectomy (adenoma)  . COLONOSCOPY  04/21/2012   RMR: Friable,fibrotic appearing colorectal anastomosis producing some luminal narrowing-not felt to be critical. path: focal erosion with slight inflammation and hyperemia. SURVEILLANCE DUE DEC 2015  . COLONOSCOPY N/A 11/24/2013   Dr. Rourk:somewhat fibrotic/friable anastomotic mucosal-status post biopsy (narrowing not felt to be clinically significant) Single colonic diverticulum. benign polypoid rectal mucosa  . colostomy reversal  april 2013  . ESOPHAGOGASTRODUODENOSCOPY  07/2010   RMR: schatki ring s/p dilation, small hh, SB bx benign  . ESOPHAGOGASTRODUODENOSCOPY N/A 09/04/2015   Procedure: ESOPHAGOGASTRODUODENOSCOPY (EGD);  Surgeon: Daneil Dolin, MD;  Location: [facility] ENDO [facility];  Service: Endoscopy;  Laterality: N/A;  730  . EUS  08/2010   Dr. Owens Loffler. uT3N0 circumferential, nearly obstruction rectosigmoid adenocarcinoma, distal edge 12cm from anal verge  . EUS N/A 06/19/2016   Procedure: UPPER ENDOSCOPIC ULTRASOUND (EUS) RADIAL;  Surgeon: Milus Banister, MD;  Location: [facility] ENDOSCOPY;  Service: Endoscopy;  Laterality: N/A;  . HERNIA REPAIR     abd hernia repair  . IVC filter    . ivc filter    . port a cath placement    . PORT-A-CATH REMOVAL  09/24/2011   Procedure: REMOVAL PORT-A-CATH;  Surgeon: Donato Heinz, MD;  Location: [facility] ORS;  Service: General;  Laterality: N/A;  Minor Room  . PORTACATH PLACEMENT Right 07/07/2016   Procedure: INSERTION PORT-A-CATH;  Surgeon: Aviva Signs, MD;  Location: [facility] ORS;  Service: General;  Laterality: Right;  Marland Kitchen VIDEO ASSISTED THORACOSCOPY (VATS)/WEDGE RESECTION Right 11/15/2014   Procedure: VIDEO ASSISTED THORACOSCOPY (VATS)/LUNG RESECTION WITH RIGHT LINGULECTOMY;  Surgeon: [name], MD;  Location: [place];  Service: Thoracic;  Laterality: Right;  Marland Kitchen VIDEO BRONCHOSCOPY N/A 11/15/2014   Procedure: VIDEO BRONCHOSCOPY;  Surgeon: [name], MD;  Location: [place];   Service: Thoracic;  Laterality: N/A;     SOCIAL HISTORY:  Social History   Social History  . Marital status: Married    Spouse name: N/A  . Number of children: 2  . Years of education: N/A   Occupational History  . 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 Smoking status: Never Smoker  . Smokeless tobacco: Never Used  . Alcohol use No  . Drug use: No  . Sexual activity: Yes    Birth control/ protection: None  Comment: married   Other Topics Concern  . Not on file   Social History Narrative  . No narrative on file    FAMILY HISTORY:  Family History  Problem Relation Age of Onset  . Cancer Brother        throat  . Cancer Brother        prostate  . Colon cancer Neg Hx   . Liver disease Neg Hx   . Inflammatory bowel disease Neg Hx     CURRENT MEDICATIONS:  Outpatient Encounter Prescriptions as of 10/28/2016  Medication Sig  . clonazePAM (KLONOPIN) 0.5 MG tablet   . clonazePAM (KLONOPIN) 1 MG tablet Take 1 tablet (1 mg total) by mouth 2 (two) times daily as needed for anxiety.  Marland Kitchen dexamethasone (DECADRON) 4 MG tablet Take 2 tablets (8 mg total) by mouth daily. Start the day after chemotherapy for 2 days. Take with food.  . Diphenhyd-Hydrocort-Nystatin (FIRST-DUKES MOUTHWASH) SUSP Use as directed 5 mLs in the mouth or throat 4 (four) times daily.  . diphenoxylate-atropine (LOMOTIL) 2.5-0.025 MG tablet Take 1 tablet by mouth 4 (four) times daily as needed for diarrhea or loose stools.  . fluorouracil CALGB 24825 in sodium chloride 0.9 % 150 mL Inject into the vein over 96 hr.  . gabapentin (NEURONTIN) 300 MG capsule Take 3 capsules (900 mg total) by mouth 2 (two) times daily.  Marland Kitchen glimepiride (AMARYL) 2 MG tablet Take 2 mg by mouth daily with breakfast.   . HYDROcodone-acetaminophen (NORCO/VICODIN) 5-325 MG tablet Take 1-2 tablets by mouth every 6 (six) hours as needed for moderate pain.  .  hydrocortisone (ANUSOL-HC) 2.5 % rectal cream Place 1 application rectally 2 (two) times daily.  Marland Kitchen leucovorin in dextrose 5 % 250 mL Inject into the vein once.  . lidocaine (XYLOCAINE) 2 % jelly   . Lidocaine 2 % GEL Apply 1 application topically 4 (four) times daily as needed.  . lidocaine-prilocaine (EMLA) cream Apply to affected area once  . mirtazapine (REMERON) 7.5 MG tablet Take 7.5 mg by mouth at bedtime.  . Nitroglycerin 0.4 % OINT Place rectally as needed.  Marland Kitchen omeprazole (PRILOSEC) 20 MG capsule Take 20 mg by mouth 2 (two) times daily before a meal.   . ondansetron (ZOFRAN) 8 MG tablet Take 1 tablet (8 mg total) by mouth 2 (two) times daily as needed for refractory nausea / vomiting. Start on day 3 after chemotherapy.  . OXALIPLATIN IV Inject into the vein.  Marland Kitchen prochlorperazine (COMPAZINE) 10 MG tablet Take 1 tablet (10 mg total) by mouth every 6 (six) hours as needed (Nausea or vomiting).  . psyllium (METAMUCIL) 58.6 % powder Take 1 packet by mouth daily.   . rivaroxaban (XARELTO) 20 MG TABS tablet Take 1 tablet (20 mg total) by mouth daily with supper.  . tamsulosin (FLOMAX) 0.4 MG CAPS capsule Take 0.4 mg by mouth at bedtime.   . traZODone (DESYREL) 100 MG tablet Take 100 mg by mouth at bedtime.  . [DISCONTINUED] HYDROcodone-acetaminophen (NORCO/VICODIN) 5-325 MG tablet Take 1 tablet by mouth every 6 (six) hours as needed for moderate pain.  . fentaNYL (DURAGESIC - DOSED MCG/HR) 12 MCG/HR Place 1 patch (12.5 mcg total) onto the skin every 3 (three) days.  Marland Kitchen zinc oxide 20 % ointment Apply 1 application topically as needed for irritation.   Facility-Administered Encounter Medications as of 10/28/2016  Medication  . sodium chloride 0.9 % injection 10 mL    ALLERGIES:  Allergies  Allergen Reactions  . Oxycodone  Blisters, hallucinations  . Tramadol     Blisters, hallucinations  . Trazodone And Nefazodone Other (See Comments)    hallucinations     PHYSICAL EXAM:  ECOG  Performance status: 1 - Symptomatic; remains independent      Physical Exam  Constitutional: He is oriented to person, place, and time and well-developed, well-nourished, and in no distress.  HENT:  Head: Normocephalic.  Mouth/Throat: Oropharynx is clear and moist.  Eyes: Conjunctivae are normal. No scleral icterus.  Neck: Normal range of motion. Neck supple.  Cardiovascular: Normal rate, regular rhythm and normal heart sounds.   Pulmonary/Chest: Effort normal and breath sounds normal. No respiratory distress. He has no wheezes. He has no rales.  Abdominal: Soft. Bowel sounds are normal. There is no tenderness. There is no rebound and no guarding.  Musculoskeletal: Normal range of motion. He exhibits no edema.  Lymphadenopathy:    He has no cervical adenopathy.       Right: No supraclavicular adenopathy present.       Left: No supraclavicular adenopathy present.  Neurological: He is alert and oriented to person, place, and time. No cranial nerve deficit.  Skin: Skin is warm and dry. No rash noted.  Psychiatric: Mood, memory, affect and judgment normal.  Nursing note and vitals reviewed.    LABORATORY DATA:  I have reviewed the labs as listed.  CBC    Component Value Date/Time   WBC 4.4 10/28/2016 0941   RBC 4.26 10/28/2016 0941   HGB 12.7 (L) 10/28/2016 0941   HGB 11.6 (L) 02/04/2011 1059   HCT 38.3 (L) 10/28/2016 0941   HCT 34.7 (L) 02/04/2011 1059   PLT 129 (L) 10/28/2016 0941   PLT 282 02/04/2011 1059   MCV 89.9 10/28/2016 0941   MCV 76.0 (L) 02/04/2011 1059   MCH 29.8 10/28/2016 0941   MCHC 33.2 10/28/2016 0941   RDW 14.7 10/28/2016 0941   RDW 18.5 (H) 02/04/2011 1059   LYMPHSABS 1.4 10/28/2016 0941   LYMPHSABS 1.1 02/04/2011 1059   MONOABS 0.4 10/28/2016 0941   MONOABS 0.5 02/04/2011 1059   EOSABS 0.1 10/28/2016 0941   EOSABS 0.1 02/04/2011 1059   BASOSABS 0.0 10/28/2016 0941   BASOSABS 0.1 02/04/2011 1059   CMP Latest Ref Rng & Units 10/28/2016 10/14/2016  09/30/2016  Glucose 65 - 99 mg/dL 181(H) 96 105(H)  BUN 6 - 20 mg/dL _0 Creatinine 0.61 - 1.24 mg/dL 0.63 0.58(L) 0.68  Sodium 135 - 145 mmol/L 139 139 141  Potassium 3.5 - 5.1 mmol/L 3.3(L) 3.8 3.4(L)  Chloride 101 - 111 mmol/L 108 107 108  CO2 22 - 32 mmol/L _1 Calcium 8.9 - 10.3 mg/dL 8.1(L) 8.5(L) 8.7(L)  Total Protein 6.5 - 8.1 g/dL 5.8(L) 5.6(L) 6.2(L)  Total Bilirubin 0.3 - 1.2 mg/dL 1.0 0.9 1.6(H)  Alkaline Phos 38 - 126 U/L 70 69 71  AST 15 - 41 U/L _2 ALT 17 - 63 U/L _3 PENDING LABS:    DIAGNOSTIC IMAGING:  *The following radiologic images and reports have been reviewed independently and agree with below findings.  CT chest/abd/pelvis: 08/26/16       PATHOLOGY:  EUS pancreatic mass biopsy: 06/19/16    Lung biopsy: 11/15/14           ASSESSMENT & PLAN:   Stage IVA adenocarcinoma of rectum with lung and pancreatic mets:  -Initially diagnosed in 07/2010. Underwent concurrent chemoradiation, followed by surgical resection. He  went on to have about 5 months of adjuvant FOLFOX chemotherapy followed by additional surgery at [facility]. He was found to be in remission after that time. Subsequent restaging PET imaging in 09/2014 revealed hypermetabolic lung lesion, which was biopsied to show adenocarcinoma consistent with colorectal primary. Underwent wedge resection with Dr. Servando Snare in 11/2014. On 06/13/16, he was noted to have several pleural based nodules representing metastatic disease, as well as pancreatic lesion suspicion for malignancy. Pancreatic lesion was biopsied via EUS and found to be metastatic adenocarcinoma.  Treatment started at [facility] with FOLFOX/Avastin on 07/08/16.  Most recent restaging imaging with CT chest/abd/pelvis on 08/27/16 showed stable disease.  -Due for cycle #9 FOLFOX/Avastin today; labs reviewed and are adequate for treatment. Continues to have peripheral neuropathy which is stable and largely unchanged.     -Restaging CT chest/abd/pelvis due on 11/06/16.  -Return to cancer center every 2 weeks for treatment  -Return to cancer center in about 2 weeks to meet with Dr. Talbert Cage to review restaging imaging and subsequent treatment plans.   Rectal pain/Increased urge to defecate:  -Likely d/t cancer.  Most recent CT scan did show some concerns for possible appendicitis, however patient remains largely asymptomatic with only mild RLQ intermittent abdominal pain.   -Of note, if Dr. [name] (or a surgeon for appendectomy) anticipates invasive procedure in the future for any reason, then we will need to coordinate with his office to hold the Avastin d/t increased risk of bleeding and infection.   -I think we can further optimize Mr. Civello's pain control. Discussed the option of starting Fentanyl patches for long-acting pain control, along with the use of Norco for breakthrough pain. Gave patient/wife education on the purpose, possible side effects, and instructions for use of Fentanyl duragesic patches.  He agreed to try them. He understands that it may take 24-36 hours for him to notice much improvement in his pain. Continue Norco for breakthrough pain. [place] Controlled Substance Reporting System reviewed and new prescription for Fentanyl patches given; Norco refilled today as well. Paper prescription given to patient today.  -Encouraged him to be aware that additional opiates can lead to constipation. He may need to use Miralax daily to prevent constipation.          Dispo:  -Restaging CT chest/abd/pelvis due on 11/06/16 as scheduled.  -Return to cancer center in about 2 weeks to meet with Dr. Talbert Cage to review scan results and further treatment planning.    All questions were answered to patient's stated satisfaction. Encouraged patient to call with any new concerns or questions before his next visit to the cancer center and we can certain see him sooner, if needed.    Plan of care discussed with Dr. Talbert Cage, who  agrees with the above aforementioned.    A total of 25 minutes was spent in face-to-face care of this patient, with greater than 50% of that time spent in counseling and care-coordination.    Orders placed this encounter:  No orders of the defined types were placed in this encounter.     Mike Craze, NP [place] [phone]

## 2016-10-28 NOTE — Patient Instructions (Signed)
[place] at [facility] Discharge Instructions  RECOMMENDATIONS MADE BY THE CONSULTANT AND ANY TEST RESULTS WILL BE SENT TO YOUR REFERRING PHYSICIAN.  Return for chemo and follow up visit with Dr. Talbert Cage in 2 weeks.   Thank you for choosing [place] at [facility] to provide your oncology and hematology care.  To afford each patient quality time with our provider, please arrive at least 15 minutes before your scheduled appointment time.    If you have a lab appointment with the [place] please come in thru the  Main Entrance and check in at the main information desk  You need to re-schedule your appointment should you arrive 10 or more minutes late.  We strive to give you quality time with our providers, and arriving late affects you and other patients whose appointments are after yours.  Also, if you no show three or more times for appointments you may be dismissed from the clinic at the providers discretion.     Again, thank you for choosing [facility].  Our hope is that these requests will decrease the amount of time that you wait before being seen by our physicians.       _____________________________________________________________  Should you have questions after your visit to [facility], please contact our office at [phone] between the hours of 8:30 a.m. and 4:30 p.m.  Voicemails left after 4:30 p.m. will not be returned until the following business day.  For prescription refill requests, have your pharmacy contact our office.       Resources For Cancer Patients and their Caregivers ? American Cancer Society: Can assist with transportation, wigs, general needs, runs Look Good Feel Better.        804-715-7476 ? Cancer Care: Provides financial assistance, online support groups, medication/co-pay assistance.  1-800-813-HOPE 7121142836) ? [place] Assists [place]  cancer patients and their families through emotional , educational and financial support.  534-402-1607 ? [place] DSS Where to apply for food stamps, Medicaid and utility assistance. 938-365-9115 ? RCATS: Transportation to medical appointments. 704 152 8072 ? Social Security Administration: May apply for disability if have a Stage IV cancer. [phone] ? [employer], Disability and Transit Services: Assists with nutrition, care and transit needs. [place] Support Programs: @10RELATIVEDAYS @ > Cancer Support Group  2nd Tuesday of the month 1pm-2pm, Journey Room  > Creative Journey  3rd Tuesday of the month 1130am-1pm, Journey Room  > Look Good Feel Better  1st Wednesday of the month 10am-12 noon, Journey Room (Call [place] to register [phone])

## 2016-10-28 NOTE — Patient Instructions (Signed)
[facility] Discharge Instructions for Patients Receiving Chemotherapy   Beginning January 23rd 2017 lab work for the [facility] will be done in the  Main lab at [facility] on 1st floor. If you have a lab appointment with the [place] please come in thru the  Main Entrance and check in at the main information desk   Today you received the following chemotherapy agents Avastin, Oxaliplatin, Leucovorin and 5FU. Follow-up as scheduled. Call clinic for any questions or concerns  To help prevent nausea and vomiting after your treatment, we encourage you to take your nausea medication   If you develop nausea and vomiting, or diarrhea that is not controlled by your medication, call the clinic.  The clinic phone number is [phone]. Office hours are Monday-Friday 8:30am-5:00pm.  BELOW ARE SYMPTOMS THAT SHOULD BE REPORTED IMMEDIATELY:  *FEVER GREATER THAN 101.0 F  *CHILLS WITH OR WITHOUT FEVER  NAUSEA AND VOMITING THAT IS NOT CONTROLLED WITH YOUR NAUSEA MEDICATION  *UNUSUAL SHORTNESS OF BREATH  *UNUSUAL BRUISING OR BLEEDING  TENDERNESS IN MOUTH AND THROAT WITH OR WITHOUT PRESENCE OF ULCERS  *URINARY PROBLEMS  *BOWEL PROBLEMS  UNUSUAL RASH Items with * indicate a potential emergency and should be followed up as soon as possible. If you have an emergency after office hours please contact your primary care physician or go to the nearest emergency department.  Please call the clinic during office hours if you have any questions or concerns.   You may also contact the Patient Navigator at [phone] should you have any questions or need assistance in obtaining follow up care.      Resources For Cancer Patients and their Caregivers ? American Cancer Society: Can assist with transportation, wigs, general needs, runs Look Good Feel Better.        916-477-1381 ? Cancer Care: Provides financial assistance, online support groups, medication/co-pay  assistance.  1-800-813-HOPE 3803152488) ? [place] Assists [place] cancer patients and their families through emotional , educational and financial support.  401-826-0360 ? [place] DSS Where to apply for food stamps, Medicaid and utility assistance. 336-837-7211 ? RCATS: Transportation to medical appointments. [phone] [phone] ? Social Security Administration: May apply for disability if have a Stage IV cancer. [phone] 3033167382 ? [employer], Disability and Transit Services: Assists with nutrition, care and transit needs. [phone]

## 2016-10-29 ENCOUNTER — Ambulatory Visit (INDEPENDENT_AMBULATORY_CARE_PROVIDER_SITE_OTHER): Payer: Medicare Other | Admitting: Gastroenterology

## 2016-10-29 ENCOUNTER — Encounter: Payer: Self-pay | Admitting: Gastroenterology

## 2016-10-29 VITALS — BP 125/71 | HR 83 | Temp 97.0°F | Ht 68.0 in | Wt 206.4 lb

## 2016-10-29 DIAGNOSIS — K6289 Other specified diseases of anus and rectum: Secondary | ICD-10-CM

## 2016-10-29 NOTE — Progress Notes (Signed)
Referring Provider: [name], MD Primary Care Physician:  [name], MD Primary GI: Dr. [name]   Chief Complaint  Patient presents with  . Gastroesophageal Reflux  . Rectal Pain    HPI:   Charles [name] is a 66 y.o. male presenting today with a history of stage IV rectal cancer (lung and pancreas). Last seen May 2018. Has had issues with frequent stooling.   Frequent BMs, stool #5. Has burning inside rectum. Nitro helps but has intermittent swelling and difficult to insert. Miconazole cream burns but helps. Will have pain just shoot up through his rectum. Stool output is better. No abdominal pain. Wife present. State if they could just get help with rectal discomfort, they would be appreciative. Do not want to pursue colonoscopy at this point due to multiple other appointments and how it "would not change the outcome".   Past Medical History:  Diagnosis Date  . Chronic anticoagulation   . Colon cancer ([place]) 07/24/2010   rectal ca, inv adenocarcinoma  . Depression 04/01/2011  . Diabetes mellitus without complication ([place])   . DVT (deep venous thrombosis) ([place]) 05/09/2011  . GERD (gastroesophageal reflux disease)   . High output ileostomy ([place]) 05/21/2011  . History of kidney stones   . HTN (hypertension)   . Hx of radiation therapy 09/02/10 to 10/14/10   pelvis  . Lung metastasis ([place])   . Neuropathy   . Peripheral vascular disease ([place])    dvt's,pe  . Pneumonia    hx x3  . Pulmonary embolism ([place])   . Rectal cancer ([place]) 01/15/2011   S/P radiation and concurrent 5-FU continuous infusion from 09/09/10- 10/10/10.  S/P proctectomy with colorectal anastomosis and diverting loop ileostomy on 11/14/10 at [facility] by Dr. Harlon Ditty. Pathology reveals a pT3b N1 with 3/20 lymph nodes.      Past Surgical History:  Procedure Laterality Date  . COLON SURGERY  11/14/2010   proctectomy with colorectal anastomosis and diverting loop ileostomy (temporary planned)  . COLONOSCOPY   07/2010   proximal rectal apple core mass 10-14cm from anal verge (adenocarcinoma), 2-3cm distal rectal carpet polyp s/p piecemeal snare polypectomy (adenoma)  . COLONOSCOPY  04/21/2012   RMR: Friable,fibrotic appearing colorectal anastomosis producing some luminal narrowing-not felt to be critical. path: focal erosion with slight inflammation and hyperemia. SURVEILLANCE DUE DEC 2015  . COLONOSCOPY N/A 11/24/2013   Dr. Rourk:somewhat fibrotic/friable anastomotic mucosal-status post biopsy (narrowing not felt to be clinically significant) Single colonic diverticulum. benign polypoid rectal mucosa  . colostomy reversal  april 2013  . ESOPHAGOGASTRODUODENOSCOPY  07/2010   RMR: schatki ring s/p dilation, small hh, SB bx benign  . ESOPHAGOGASTRODUODENOSCOPY N/A 09/04/2015   Procedure: ESOPHAGOGASTRODUODENOSCOPY (EGD);  Surgeon: Daneil Dolin, MD;  Location: [facility] ENDO SUITE;  Service: Endoscopy;  Laterality: N/A;  730  . EUS  08/2010   Dr. Owens Loffler. uT3N0 circumferential, nearly obstruction rectosigmoid adenocarcinoma, distal edge 12cm from anal verge  . EUS N/A 06/19/2016   Procedure: UPPER ENDOSCOPIC ULTRASOUND (EUS) RADIAL;  Surgeon: Milus Banister, MD;  Location: [facility] ENDOSCOPY;  Service: Endoscopy;  Laterality: N/A;  . HERNIA REPAIR     abd hernia repair  . IVC filter    . ivc filter    . port a cath placement    . PORT-A-CATH REMOVAL  09/24/2011   Procedure: REMOVAL PORT-A-CATH;  Surgeon: Donato Heinz, MD;  Location: [facility] ORS;  Service: General;  Laterality: N/A;  Minor Room  .  PORTACATH PLACEMENT Right 07/07/2016   Procedure: INSERTION PORT-A-CATH;  Surgeon: Aviva Signs, MD;  Location: [facility] ORS;  Service: General;  Laterality: Right;  Marland Kitchen VIDEO ASSISTED THORACOSCOPY (VATS)/WEDGE RESECTION Right 11/15/2014   Procedure: VIDEO ASSISTED THORACOSCOPY (VATS)/LUNG RESECTION WITH RIGHT LINGULECTOMY;  Surgeon: [name], MD;  Location: [facility];  Service: Thoracic;  Laterality: Right;  Marland Kitchen VIDEO  BRONCHOSCOPY N/A 11/15/2014   Procedure: VIDEO BRONCHOSCOPY;  Surgeon: [name], MD;  Location: [facility] OR;  Service: Thoracic;  Laterality: N/A;    Current Outpatient Prescriptions  Medication Sig Dispense Refill  . clonazePAM (KLONOPIN) 1 MG tablet Take 1 tablet (1 mg total) by mouth 2 (two) times daily as needed for anxiety. 60 tablet 0  . dexamethasone (DECADRON) 4 MG tablet Take 2 tablets (8 mg total) by mouth daily. Start the day after chemotherapy for 2 days. Take with food. 30 tablet 1  . fentaNYL (DURAGESIC - DOSED MCG/HR) 12 MCG/HR Place 1 patch (12.5 mcg total) onto the skin every 3 (three) days. 5 patch 0  . fluorouracil CALGB 96789 in sodium chloride 0.9 % 150 mL Inject into the vein over 96 hr.    . gabapentin (NEURONTIN) 300 MG capsule Take 3 capsules (900 mg total) by mouth 2 (two) times daily. 540 capsule 1  . glimepiride (AMARYL) 2 MG tablet Take 2 mg by mouth daily with breakfast.     . HYDROcodone-acetaminophen (NORCO/VICODIN) 5-325 MG tablet Take 1-2 tablets by mouth every 6 (six) hours as needed for moderate pain. 75 tablet 0  . hydrocortisone (ANUSOL-HC) 2.5 % rectal cream Place 1 application rectally 2 (two) times daily. 30 g 0  . leucovorin in dextrose 5 % 250 mL Inject into the vein once.    . Lidocaine 2 % GEL Apply 1 application topically 4 (four) times daily as needed. 28.33 g 1  . lidocaine-prilocaine (EMLA) cream Apply to affected area once 30 g 3  . mirtazapine (REMERON) 7.5 MG tablet Take 7.5 mg by mouth at bedtime.    . Nitroglycerin 0.4 % OINT Place rectally as needed.    Marland Kitchen omeprazole (PRILOSEC) 20 MG capsule Take 20 mg by mouth 2 (two) times daily before a meal.     . ondansetron (ZOFRAN) 8 MG tablet Take 1 tablet (8 mg total) by mouth 2 (two) times daily as needed for refractory nausea / vomiting. Start on day 3 after chemotherapy. 30 tablet 1  . OXALIPLATIN IV Inject into the vein.    Marland Kitchen prochlorperazine (COMPAZINE) 10 MG tablet Take 1 tablet (10 mg  total) by mouth every 6 (six) hours as needed (Nausea or vomiting). 30 tablet 1  . psyllium (METAMUCIL) 58.6 % powder Take 1 packet by mouth daily.     . rivaroxaban (XARELTO) 20 MG TABS tablet Take 1 tablet (20 mg total) by mouth daily with supper. 30 tablet 3  . tamsulosin (FLOMAX) 0.4 MG CAPS capsule Take 0.4 mg by mouth at bedtime.     . traZODone (DESYREL) 100 MG tablet Take 100 mg by mouth at bedtime.    Marland Kitchen zinc oxide 20 % ointment Apply 1 application topically as needed for irritation. 56.7 g 1  . clonazePAM (KLONOPIN) 0.5 MG tablet     . Diphenhyd-Hydrocort-Nystatin (FIRST-DUKES MOUTHWASH) SUSP Use as directed 5 mLs in the mouth or throat 4 (four) times daily. (Patient not taking: Reported on 10/29/2016) 360 mL 3  . diphenoxylate-atropine (LOMOTIL) 2.5-0.025 MG tablet Take 1 tablet by mouth 4 (four) times daily  as needed for diarrhea or loose stools. (Patient not taking: Reported on 10/29/2016) 60 tablet 0  . lidocaine (XYLOCAINE) 2 % jelly      No current facility-administered medications for this visit.    Facility-Administered Medications Ordered in Other Visits  Medication Dose Route Frequency Provider Last Rate Last Dose  . sodium chloride 0.9 % injection 10 mL  10 mL Intravenous PRN Everardo All, MD   10 mL at 08/22/11 1114    Allergies as of 10/29/2016 - Review Complete 10/28/2016  Allergen Reaction Noted  . Oxycodone  11/23/2014  . Tramadol  11/23/2014  . Trazodone and nefazodone Other (See Comments) 09/08/2014    Family History  Problem Relation Age of Onset  . Cancer Brother        throat  . Cancer Brother        prostate  . Colon cancer Neg Hx   . Liver disease Neg Hx   . Inflammatory bowel disease Neg Hx     Social History   Social History  . Marital status: Married    Spouse name: N/A  . Number of children: 2  . Years of education: N/A   Occupational History  . 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 Smoking status: Never Smoker  . Smokeless tobacco: Never Used  . Alcohol use No  . Drug use: No  . Sexual activity: Yes    Birth control/ protection: None     Comment: married   Other Topics Concern  . None   Social History Narrative  . None    Review of Systems: As mentioned in HPI   Physical Exam: BP 125/71   Pulse 83   Temp 97 F (36.1 C) (Oral)   Ht 5\' 8"  (1.727 m)   Wt 206 lb 6.4 oz (93.6 kg)   BMI 31.38 kg/m  General:   Alert and oriented. No distress noted. Pleasant and cooperative.  Head:  Normocephalic and atraumatic. Eyes:  Conjuctiva clear without scleral icterus. Abdomen:  +BS, soft, non-tender and non-distended. No rebound or guarding. No HSM or masses noted. Msk:  Symmetrical without gross deformities. Normal posture. Extremities:  Without edema. Neurologic:  Alert and  oriented x4 Psych:  Alert and cooperative. Normal mood and affect.

## 2016-10-29 NOTE — Patient Instructions (Signed)
I am calling [facility] regarding the cream to compound.  We will see you in 3 months! Let me know how the cream does for you in about a week. [facility] will probably still call you once it is completed.

## 2016-10-30 ENCOUNTER — Encounter (HOSPITAL_BASED_OUTPATIENT_CLINIC_OR_DEPARTMENT_OTHER): Payer: Medicare Other

## 2016-10-30 VITALS — BP 153/84 | HR 64 | Temp 97.8°F | Resp 16

## 2016-10-30 DIAGNOSIS — Z452 Encounter for adjustment and management of vascular access device: Secondary | ICD-10-CM | POA: Diagnosis present

## 2016-10-30 DIAGNOSIS — C20 Malignant neoplasm of rectum: Secondary | ICD-10-CM

## 2016-10-30 MED ORDER — HEPARIN SOD (PORK) LOCK FLUSH 100 UNIT/ML IV SOLN
500.0000 [IU] | Freq: Once | INTRAVENOUS | Status: AC | PRN
Start: 2016-10-30 — End: 2016-10-30
  Administered 2016-10-30: 500 [IU]

## 2016-10-30 MED ORDER — HEPARIN SOD (PORK) LOCK FLUSH 100 UNIT/ML IV SOLN
INTRAVENOUS | Status: AC
  Filled 2016-10-30: qty 5

## 2016-10-30 MED ORDER — SODIUM CHLORIDE 0.9% FLUSH
10.0000 mL | INTRAVENOUS | Status: DC | PRN
  Administered 2016-10-30: 10 mL
  Filled 2016-10-30: qty 10

## 2016-10-30 NOTE — Patient Instructions (Signed)
[place] at [facility] Discharge Instructions  RECOMMENDATIONS MADE BY THE CONSULTANT AND ANY TEST RESULTS WILL BE SENT TO YOUR REFERRING PHYSICIAN.  Disconnected infusion pump and flushed port. Return as scheduled.  Thank you for choosing [place] at [facility] to provide your oncology and hematology care.  To afford each patient quality time with our provider, please arrive at least 15 minutes before your scheduled appointment time.    If you have a lab appointment with the [place] please come in thru the  Main Entrance and check in at the main information desk  You need to re-schedule your appointment should you arrive 10 or more minutes late.  We strive to give you quality time with our providers, and arriving late affects you and other patients whose appointments are after yours.  Also, if you no show three or more times for appointments you may be dismissed from the clinic at the providers discretion.     Again, thank you for choosing [facility].  Our hope is that these requests will decrease the amount of time that you wait before being seen by our physicians.       _____________________________________________________________  Should you have questions after your visit to [facility], please contact our office at [phone] between the hours of 8:30 a.m. and 4:30 p.m.  Voicemails left after 4:30 p.m. will not be returned until the following business day.  For prescription refill requests, have your pharmacy contact our office.       Resources For Cancer Patients and their Caregivers ? American Cancer Society: Can assist with transportation, wigs, general needs, runs Look Good Feel Better.        ([phone] ? Cancer Care: Provides financial assistance, online support groups, medication/co-pay assistance.  1-800-813-HOPE 705-642-7199) ? [place] Assists [place]  cancer patients and their families through emotional , educational and financial support.  [phone] ? [place] DSS Where to apply for food stamps, Medicaid and utility assistance. ([phone] ? RCATS: Transportation to medical appointments. [phone] ? Social Security Administration: May apply for disability if have a Stage IV cancer. [phone] [phone] ? [employer], Disability and Transit Services: Assists with nutrition, care and transit needs. [place] Support Programs: @10RELATIVEDAYS @ > Cancer Support Group  2nd Tuesday of the month 1pm-2pm, Journey Room  > Creative Journey  3rd Tuesday of the month 1130am-1pm, Journey Room  > Look Good Feel Better  1st Wednesday of the month 10am-12 noon, Journey Room (Call [place] to register [phone])

## 2016-10-30 NOTE — Progress Notes (Signed)
Continuous infusion pump with 0.5 ml remaining on arrival to clinic. Primed remainder of volume in. Disconnected pump from patient. Flushed port per protocol and removed needle. Patient stable and ambulatory on discharge home with wife.

## 2016-10-31 LAB — CEA: CEA: 2.1 ng/mL (ref 0.0–4.7)

## 2016-10-31 NOTE — Assessment & Plan Note (Signed)
66 year old male with stage IV rectal cancer, presenting in follow-up for frequent stool. This has improved somewhat since last visit. He is not desirous of colonoscopy, as he has multiple other appointments and feels this will not change the end outcome, which is appropriate. His main concern is of rectal discomfort. I have called [employer] and ordered the hemorrhoid cream with lidocaine, nitro, and nystatin. Hopefully, by combining all these agents, he will have better quality of life and some improvement noted. Call with update after using cream for about a week. Return in 3 months or sooner if needed.

## 2016-11-03 ENCOUNTER — Ambulatory Visit: Payer: Medicare Other | Admitting: Gastroenterology

## 2016-11-03 NOTE — Progress Notes (Signed)
CC'D TO PCP °

## 2016-11-06 ENCOUNTER — Encounter (HOSPITAL_COMMUNITY): Payer: Self-pay

## 2016-11-06 ENCOUNTER — Ambulatory Visit (HOSPITAL_COMMUNITY)
Admission: RE | Admit: 2016-11-06 | Discharge: 2016-11-06 | Disposition: A | Discharge status: Home / Self Care | Payer: Medicare Other | Source: Ambulatory Visit | Attending: Adult Health | Admitting: Adult Health

## 2016-11-06 DIAGNOSIS — K449 Diaphragmatic hernia without obstruction or gangrene: Secondary | ICD-10-CM | POA: Diagnosis not present

## 2016-11-06 DIAGNOSIS — C7800 Secondary malignant neoplasm of unspecified lung: Secondary | ICD-10-CM | POA: Insufficient documentation

## 2016-11-06 DIAGNOSIS — C20 Malignant neoplasm of rectum: Secondary | ICD-10-CM | POA: Diagnosis not present

## 2016-11-06 DIAGNOSIS — J9811 Atelectasis: Secondary | ICD-10-CM | POA: Insufficient documentation

## 2016-11-06 DIAGNOSIS — I2510 Atherosclerotic heart disease of native coronary artery without angina pectoris: Secondary | ICD-10-CM | POA: Diagnosis not present

## 2016-11-06 DIAGNOSIS — K869 Disease of pancreas, unspecified: Secondary | ICD-10-CM | POA: Insufficient documentation

## 2016-11-06 DIAGNOSIS — I700 Atherosclerosis of aorta: Secondary | ICD-10-CM | POA: Diagnosis not present

## 2016-11-06 DIAGNOSIS — C3490 Malignant neoplasm of unspecified part of unspecified bronchus or lung: Secondary | ICD-10-CM | POA: Diagnosis not present

## 2016-11-06 MED ORDER — IOPAMIDOL (ISOVUE-300) INJECTION 61%
100.0000 mL | Freq: Once | INTRAVENOUS | Status: AC | PRN
  Administered 2016-11-06: 100 mL via INTRAVENOUS

## 2016-11-10 ENCOUNTER — Telehealth (HOSPITAL_COMMUNITY): Payer: Self-pay | Admitting: Emergency Medicine

## 2016-11-10 NOTE — Telephone Encounter (Signed)
Went over scan results with pts wife.

## 2016-11-11 ENCOUNTER — Encounter (HOSPITAL_BASED_OUTPATIENT_CLINIC_OR_DEPARTMENT_OTHER): Payer: Medicare Other | Admitting: Oncology

## 2016-11-11 ENCOUNTER — Encounter (HOSPITAL_COMMUNITY): Payer: Medicare Other | Attending: Oncology

## 2016-11-11 ENCOUNTER — Encounter (HOSPITAL_COMMUNITY): Payer: Self-pay

## 2016-11-11 VITALS — BP 176/86 | HR 73 | Temp 97.5°F | Resp 18 | Ht 74.0 in | Wt 203.0 lb

## 2016-11-11 VITALS — BP 150/87 | HR 76 | Temp 98.2°F | Resp 18

## 2016-11-11 DIAGNOSIS — C7801 Secondary malignant neoplasm of right lung: Secondary | ICD-10-CM

## 2016-11-11 DIAGNOSIS — E860 Dehydration: Secondary | ICD-10-CM | POA: Diagnosis not present

## 2016-11-11 DIAGNOSIS — C20 Malignant neoplasm of rectum: Secondary | ICD-10-CM | POA: Insufficient documentation

## 2016-11-11 DIAGNOSIS — C7889 Secondary malignant neoplasm of other digestive organs: Secondary | ICD-10-CM

## 2016-11-11 DIAGNOSIS — F419 Anxiety disorder, unspecified: Secondary | ICD-10-CM

## 2016-11-11 DIAGNOSIS — Z5112 Encounter for antineoplastic immunotherapy: Secondary | ICD-10-CM

## 2016-11-11 DIAGNOSIS — R194 Change in bowel habit: Secondary | ICD-10-CM

## 2016-11-11 LAB — CBC WITH DIFFERENTIAL/PLATELET
Basophils Absolute: 0 10*3/uL (ref 0.0–0.1)
Basophils Relative: 1 %
Eosinophils Absolute: 0.1 10*3/uL (ref 0.0–0.7)
Eosinophils Relative: 3 %
HCT: 38.3 % — ABNORMAL LOW (ref 39.0–52.0)
Hemoglobin: 12.5 g/dL — ABNORMAL LOW (ref 13.0–17.0)
Lymphocytes Relative: 32 %
Lymphs Abs: 1.3 10*3/uL (ref 0.7–4.0)
MCH: 29.1 pg (ref 26.0–34.0)
MCHC: 32.6 g/dL (ref 30.0–36.0)
MCV: 89.3 fL (ref 78.0–100.0)
Monocytes Absolute: 0.5 10*3/uL (ref 0.1–1.0)
Monocytes Relative: 11 %
Neutro Abs: 2.2 10*3/uL (ref 1.7–7.7)
Neutrophils Relative %: 53 %
Platelets: 106 10*3/uL — ABNORMAL LOW (ref 150–400)
RBC: 4.29 MIL/uL (ref 4.22–5.81)
RDW: 14.6 % (ref 11.5–15.5)
WBC: 4.1 10*3/uL (ref 4.0–10.5)

## 2016-11-11 LAB — URINALYSIS, DIPSTICK ONLY
Bilirubin Urine: NEGATIVE
Glucose, UA: NEGATIVE mg/dL
Hgb urine dipstick: NEGATIVE
Ketones, ur: NEGATIVE mg/dL
Leukocytes, UA: NEGATIVE
Nitrite: NEGATIVE
Protein, ur: NEGATIVE mg/dL
Specific Gravity, Urine: >1.03 — ABNORMAL HIGH (ref 1.005–1.030)
pH: 6 (ref 5.0–8.0)

## 2016-11-11 LAB — COMPREHENSIVE METABOLIC PANEL
ALT: 19 U/L (ref 17–63)
AST: 25 U/L (ref 15–41)
Albumin: 3.4 g/dL — ABNORMAL LOW (ref 3.5–5.0)
Alkaline Phosphatase: 68 U/L (ref 38–126)
Anion gap: 7 (ref 5–15)
BUN: 7 mg/dL (ref 6–20)
CO2: 24 mmol/L (ref 22–32)
Calcium: 8.5 mg/dL — ABNORMAL LOW (ref 8.9–10.3)
Chloride: 109 mmol/L (ref 101–111)
Creatinine, Ser: 0.62 mg/dL (ref 0.61–1.24)
GFR calc Af Amer: >60 mL/min (ref >60)
GFR calc non Af Amer: >60 mL/min (ref >60)
Glucose, Bld: 115 mg/dL — ABNORMAL HIGH (ref 65–99)
Potassium: 3.4 mmol/L — ABNORMAL LOW (ref 3.5–5.1)
Sodium: 140 mmol/L (ref 135–145)
Total Bilirubin: 1 mg/dL (ref 0.3–1.2)
Total Protein: 5.8 g/dL — ABNORMAL LOW (ref 6.5–8.1)

## 2016-11-11 MED ORDER — DEXTROSE 5 % IV SOLN
Freq: Once | INTRAVENOUS | Status: AC
  Administered 2016-11-11: 12:00:00 via INTRAVENOUS

## 2016-11-11 MED ORDER — SODIUM CHLORIDE 0.9 % IV SOLN
INTRAVENOUS | Status: AC
  Administered 2016-11-11: 11:00:00 via INTRAVENOUS

## 2016-11-11 MED ORDER — LEUCOVORIN CALCIUM INJECTION 350 MG
400.0000 mg/m2 | Freq: Once | INTRAVENOUS | Status: AC
  Administered 2016-11-11: 880 mg via INTRAVENOUS
  Filled 2016-11-11: qty 35

## 2016-11-11 MED ORDER — POTASSIUM CHLORIDE CRYS ER 20 MEQ PO TBCR
40.0000 meq | EXTENDED_RELEASE_TABLET | Freq: Once | ORAL | Status: AC
Start: 2016-11-11 — End: 2016-11-11
  Administered 2016-11-11: 40 meq via ORAL
  Filled 2016-11-11: qty 2

## 2016-11-11 MED ORDER — SODIUM CHLORIDE 0.9% FLUSH
10.0000 mL | INTRAVENOUS | Status: DC | PRN
  Administered 2016-11-11: 10 mL
  Filled 2016-11-11: qty 10

## 2016-11-11 MED ORDER — DEXAMETHASONE SODIUM PHOSPHATE 10 MG/ML IJ SOLN
10.0000 mg | Freq: Once | INTRAMUSCULAR | Status: AC
  Administered 2016-11-11: 10 mg via INTRAVENOUS
  Filled 2016-11-11: qty 1

## 2016-11-11 MED ORDER — FLUOROURACIL CHEMO INJECTION 2.5 GM/50ML
400.0000 mg/m2 | Freq: Once | INTRAVENOUS | Status: AC
  Administered 2016-11-11: 900 mg via INTRAVENOUS
  Filled 2016-11-11: qty 18

## 2016-11-11 MED ORDER — SODIUM CHLORIDE 0.9 % IV SOLN
2400.0000 mg/m2 | INTRAVENOUS | Status: DC
  Administered 2016-11-11: 5300 mg via INTRAVENOUS
  Filled 2016-11-11: qty 100

## 2016-11-11 MED ORDER — SODIUM CHLORIDE 0.9 % IV SOLN
5.5000 mg/kg | Freq: Once | INTRAVENOUS | Status: AC
  Administered 2016-11-11: 500 mg via INTRAVENOUS
  Filled 2016-11-11: qty 4

## 2016-11-11 MED ORDER — SODIUM CHLORIDE 0.9 % IV SOLN
INTRAVENOUS | Status: DC
  Administered 2016-11-11: 12:00:00 via INTRAVENOUS

## 2016-11-11 MED ORDER — DIPHENHYDRAMINE HCL 50 MG/ML IJ SOLN
50.0000 mg | Freq: Once | INTRAMUSCULAR | Status: AC
  Administered 2016-11-11: 50 mg via INTRAVENOUS
  Filled 2016-11-11: qty 1

## 2016-11-11 MED ORDER — SODIUM CHLORIDE 0.9 % IV SOLN: 10.0000 mg | Freq: Once | INTRAVENOUS | Status: DC

## 2016-11-11 MED ORDER — OXALIPLATIN CHEMO INJECTION 100 MG/20ML
85.0000 mg/m2 | Freq: Once | INTRAVENOUS | Status: AC
  Administered 2016-11-11: 185 mg via INTRAVENOUS
  Filled 2016-11-11: qty 37

## 2016-11-11 MED ORDER — PALONOSETRON HCL INJECTION 0.25 MG/5ML
0.2500 mg | Freq: Once | INTRAVENOUS | Status: AC
  Administered 2016-11-11: 0.25 mg via INTRAVENOUS
  Filled 2016-11-11: qty 5

## 2016-11-11 NOTE — Progress Notes (Signed)
Charles [name] tolerated chemo tx with hydration well without complaints or incident. Labs and urine protein  Reviewed with Dr. Talbert Cage prior to administering chemotherapy. Pt discharged with 5FU pump infusing without issues. VSS upon discharge. Pt discharged self ambulatory in satisfactory condition accompanied by his wife

## 2016-11-11 NOTE — Patient Instructions (Signed)
[facility] Discharge Instructions for Patients Receiving Chemotherapy   Beginning January 23rd 2017 lab work for the [facility] will be done in the  Main lab at [facility] on 1st floor. If you have a lab appointment with the [place] please come in thru the  Main Entrance and check in at the main information desk   Today you received the following chemotherapy agents Avastin, Oxaliplatin,Leucovorin and 5FU. Follow-up as scheduled. Call clinic for any questions or concerns  To help prevent nausea and vomiting after your treatment, we encourage you to take your nausea medication   If you develop nausea and vomiting, or diarrhea that is not controlled by your medication, call the clinic.  The clinic phone number is [phone]. Office hours are Monday-Friday 8:30am-5:00pm.  BELOW ARE SYMPTOMS THAT SHOULD BE REPORTED IMMEDIATELY:  *FEVER GREATER THAN 101.0 F  *CHILLS WITH OR WITHOUT FEVER  NAUSEA AND VOMITING THAT IS NOT CONTROLLED WITH YOUR NAUSEA MEDICATION  *UNUSUAL SHORTNESS OF BREATH  *UNUSUAL BRUISING OR BLEEDING  TENDERNESS IN MOUTH AND THROAT WITH OR WITHOUT PRESENCE OF ULCERS  *URINARY PROBLEMS  *BOWEL PROBLEMS  UNUSUAL RASH Items with * indicate a potential emergency and should be followed up as soon as possible. If you have an emergency after office hours please contact your primary care physician or go to the nearest emergency department.  Please call the clinic during office hours if you have any questions or concerns.   You may also contact the Patient Navigator at [phone] should you have any questions or need assistance in obtaining follow up care.      Resources For Cancer Patients and their Caregivers ? American Cancer Society: Can assist with transportation, wigs, general needs, runs Look Good Feel Better.        615-108-4631 ? Cancer Care: Provides financial assistance, online support groups, medication/co-pay  assistance.  1-800-813-HOPE (272)573-9310) ? [place] Assists [place] cancer patients and their families through emotional , educational and financial support.  479-742-7896 ? [place] DSS Where to apply for food stamps, Medicaid and utility assistance. 531-322-2717 ? [employer]ATS: Transportation to medical appointments. ([phone] ? Social Security Administration: May apply for disability if have a Stage IV cancer. [phone] 620-419-9333 ? [employer], Disability and Transit Services: Assists with nutrition, care and transit needs. [phone]

## 2016-11-11 NOTE — Progress Notes (Signed)
PROGRESS NOTE   [name], MD [address] [place] [address]  History of Stage III cancer of the rectum status post preoperative radiation and chemotherapy followed by definitive surgery. He then underwent adjuvant chemotherapy with FOLFOX. During the radiation therapy and continuous infusion 5-FU from 09/09/2010 through 10/10/2010. FOLFOX was administered from October 2012 through 07/14/2011.  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      11/14/2010 Surgery    S/P proctectomy with colorectal anastomosis and diverting loop ileostomy on 11/14/10 at [facility] by Dr. Harlon Ditty. Pathology reveals a pT3b N1 with 3/20 lymph nodes.      02/05/2011 - 07/14/2011 Chemotherapy    FOLFOX      08/18/2011 Surgery    Approximate date of surgery- [place] by Dr. Harlon Ditty       Remission         11/24/2013 Survivorship    Colonoscopy- somewhat fibrotic/friable anastomotic mucosal-status post biopsy (narrowing not felt to be clinically significant).  Negative pathology for malignancy      09/12/2014 Imaging    DVT in the left femoral venous system, left common iliac vein, IVC, and within the IVC filter Right lower extremity venography confirms chronic occlusion of the femoral venous system with collateralization. The right iliac venous system is patent and do      09/25/2014 PET scan    The right middle lobe pulmonary nodule is hypermetabolic, favored to represent a primary bronchogenic carcinoma.Equivocal mediastinal nodes, similar to surrounding blood pool. Bilateral adrenal hypermetabolism, felt to be physiologic      10/24/2014 Pathology Results    Lung, needle/core  biopsy(ies), RML - ADENOCARCINOMA, SEE COMMENT metastatic adenocarcinoma of a colorectal primary      10/24/2014 Relapse/Recurrence         11/16/2014 Definitive Surgery    Bronchoscopy, right video-assisted thoracoscopy, wedge resection of right middle lobe by Dr. Servando Snare      11/16/2014 Pathology Results    Lung, wedge biopsy/resection, right lingula and small portion of middle lobe - METASTATIC ADENOCARCINOMA, CONSISTENT WITH COLORECTAL PRIMARY, SPANNING 2.0 CM. - THE SURGICAL RESECTION MARGINS ARE NEGATIVE FOR ADENOCARCINOMA.      11/16/2014 Remission    THE SURGICAL RESECTION MARGINS ARE NEGATIVE FOR ADENOCARCINOMA.      03/07/2015 Imaging    CT CAP- Interval resection of right middle lobe metastasis. No acute process or evidence of metastatic disease in the chest, abdomen or pelvis. Improved right upper lobe reticular nodular opacities are favored to represent resolving infection.      09/14/2015 Imaging    CT CAP- No findings of recurrent malignancy. No recurrence along the wedge resection site of the right middle lobe. Right anterior abdominal wall focal hernia containing a knuckle of small bowel without complicating feature.      06/13/2016 Imaging    CT CAP- 1. New right-sided pleural metastasis/mass. Other smaller right-sided pulmonary nodules which are all pleural-based and most likely represent pleural metastasis. 2. No convincing evidence of abdominopelvic nodal metastasis. 3. Constellation of findings, including pancreatic atrophy, duct dilatation, and pancreatic head soft tissue fullness which are highly suspicious for pancreatic adenocarcinoma. Metastatic disease felt much less likely.  Consider endoscopic ultrasound sampling or ERCP. Cannot exclude superimposed acute pancreatitis. 4. New enlargement of the appendiceal tip with subtle surrounding edema. Cannot exclude early or mild appendicitis. 5. Coronary artery atherosclerosis. Aortic atherosclerosis. 6. Partial  anomalous pulmonary venous return from the left upper lobe.      06/13/2016 Progression    CT scan demonstrates progression of disease      06/19/2016 Procedure    EUS with FNA by Dr. Ardis Hughs      06/20/2016 Pathology Results    FINE NEEDLE ASPIRATION, ENDOSCOPIC, PANCREAS UNCINATE AREA(SPECIMEN 1 OF 1 COLLECTED 06/19/16): MALIGNANT CELLS CONSISTENT WITH METASTATIC ADENOCARCINOMA.      06/25/2016 PET scan    1. Two pleural-based nodules in the right hemithorax are hypermetabolic. Metastatic disease is a distinct consideration. The scattered pulmonary parenchymal nodule seen on previous diagnostic CT imaging are below the threshold for reliable resolution on PET imaging. 2. Hypermetabolic lesion pancreatic head, consistent with neoplasm. As noted on prior CT, adenocarcinoma is any consideration. No evidence for hypermetabolic abdominal lymphadenopathy. Mottled uptake noted in the liver, but no discrete hepatic metastases are evident on PET imaging. 3. Appendix remains distended up to 0.9-10 mm diameter with a stone towards the tip in subtle periappendiceal edema/inflammation. The appendix is hypermetabolic along its length. Imaging features are relatively stable in the 12 day interval since prior CT scan. While appendicitis is a consideration, the relative stability over 12 days would be unusual for that etiology. Continued close follow-up recommended.      06/26/2016 Pathology Results    Not enough tissue for foundationONE or K-ras testing.      07/07/2016 Procedure    Port placed by Dr. Arnoldo Morale      07/08/2016 -  Chemotherapy    The patient had pegfilgrastim (NEULASTA) injection 6 mg, 6 mg, Subcutaneous,  Once, 0 of 4 cycles  ondansetron (ZOFRAN) IVPB 8 mg, 8 mg, Intravenous,  Once, 0 of 4 cycles  leucovorin 804 mg in dextrose 5 % 250 mL infusion, 400 mg/m2, Intravenous,  Once, 0 of 4 cycles  oxaliplatin (ELOXATIN) 170 mg in dextrose 5 % 500 mL chemo infusion, 85 mg/m2,  Intravenous,  Once, 0 of 4 cycles  fluorouracil (ADRUCIL) 4,800 mg in sodium chloride 0.9 % 150 mL chemo infusion, 2,400 mg/m2 = 4,800 mg, Intravenous, 1 Day/Dose, 0 of 4 cycles  fluorouracil (ADRUCIL) chemo injection 800 mg, 400 mg/m2, Intravenous,  Once, 0 of 4 cycles  pegfilgrastim (NEULASTA) injection 6 mg, 6 mg, Subcutaneous,  Once, 2 of 2 cycles  ondansetron (ZOFRAN) IVPB 8 mg, 8 mg, Intravenous,  Once, 12 of 12 cycles  leucovorin 660 mg in dextrose 5 % 250 mL infusion, 660 mg (original dose ), Intravenous,  Once, 12 of 12 cycles Dose modification: 660 mg (Cycle 1)  oxaliplatin (ELOXATIN) 140 mg in dextrose 5 % 500 mL chemo infusion, 140 mg (original dose ), Intravenous,  Once, 12 of 12 cycles Dose modification: 140 mg (Cycle 1), 119 mg (85 % of original dose 140 mg, Cycle 8, Reason: Provider Judgment)  fluorouracil (ADRUCIL) 3,850 mg in sodium chloride 0.9 % 150 mL chemo infusion, 3,850 mg (original dose ), Intravenous, 1 Day/Dose, 12 of 12 cycles Dose modification: 3,850 mg (Cycle 1)  fluorouracil (ADRUCIL) chemo injection 700 mg, 700 mg (original dose ), Intravenous,  Once, 12 of 12 cycles Dose modification: 700 mg (Cycle 1)  palonosetron (ALOXI) injection 0.25 mg, 0.25 mg, Intravenous,  Once, 7 of 12 cycles Administration: 0.25 mg (09/30/2016)  bevacizumab (  AVASTIN) 475 mg in sodium chloride 0.9 % 100 mL chemo infusion, 5 mg/kg = 475 mg, Intravenous,  Once, 6 of 11 cycles Administration: 500 mg (09/30/2016)  leucovorin 880 mg in dextrose 5 % 250 mL infusion, 400 mg/m2 = 880 mg, Intravenous,  Once, 7 of 12 cycles Administration: 880 mg (09/30/2016)  oxaliplatin (ELOXATIN) 185 mg in dextrose 5 % 500 mL chemo infusion, 85 mg/m2 = 185 mg, Intravenous,  Once, 7 of 12 cycles Administration: 185 mg (09/30/2016)  fluorouracil (ADRUCIL) chemo injection 900 mg, 400 mg/m2 = 900 mg, Intravenous,  Once, 7 of 12 cycles Administration: 900 mg (09/30/2016)  fluorouracil (ADRUCIL) 5,300 mg  in sodium chloride 0.9 % 144 mL chemo infusion, 2,400 mg/m2 = 5,300 mg, Intravenous, 1 Day/Dose, 7 of 12 cycles Administration: 5,300 mg (09/30/2016)  for chemotherapy treatment.        08/27/2016 Imaging    CT CAP- 1. Pulmonary metastatic disease is stable. 2. Pancreatic head mass, grossly stable, with progressive atrophy of the body and tail of the pancreas. Stable peripancreatic lymph node. 3. Mild circumferential rectal wall thickening, stable. 4. Aortic atherosclerosis (ICD10-170.0). Coronary artery calcification. 5. Hyperattenuating lesion off the lower pole left kidney, stable, too small to characterize. Continued attention on followup exams is warranted. 6. Persistent wall thickening and mild dilatation of the proximal appendix, of uncertain etiology. Continued attention on followup exams is warranted.      11/06/2016 Imaging    CT C/A/P: IMPRESSION: 1. Stable pulmonary metastatic disease. 2. Similar appearing pancreatic head mass. Progressed atrophy of the pancreatic body and tail. Stable peripancreatic lymph node. 3. Stable hypoattenuating lesion partially exophytic off the inferior pole of the left kidney. 4. Aortic atherosclerosis. 5. Persistent mild wall thickening and mild dilatation of the appendix.        INTERVAL HISTORY: Charles [name] 66 y.o. male returns for followup of stage IV CRC.   He is accompanied today by his wife. He is here for cycle 10 of modified FOLFOX with Avastin. He states that he has been tolerating chemotherapy well except for 2 days after chemotherapy he has frequent bowel movements 10-15 a day with small stools, however these are not watery they are formed stools. Sometimes he takes Imodium for it, which causes him to get constipated instead. He states that his rectum is very raw from having so many bowel movements, and Dr. [name] gave him a topical cream to put up his rectum. He also states that he continues to have insomnia and 92 his  mind racing. 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  Past Medical History:  Diagnosis Date  . Chronic anticoagulation   . Colon cancer ([place]) 07/24/2010   rectal ca, inv adenocarcinoma  . Depression 04/01/2011  . Diabetes mellitus without complication ([place])   . DVT (deep venous thrombosis) ([place]) 05/09/2011  . GERD (gastroesophageal reflux disease)   . High output ileostomy ([place]) 05/21/2011  . History of kidney stones   . HTN (hypertension)   . Hx of radiation therapy 09/02/10 to 10/14/10   pelvis  . Lung metastasis ([place])   . Neuropathy   . Peripheral vascular disease ([place])    dvt's,pe  . Pneumonia    hx x3  . Pulmonary embolism ([place])   . Rectal cancer ([place]) 01/15/2011   S/P radiation and concurrent 5-FU continuous  infusion from 09/09/10- 10/10/10.  S/P proctectomy with colorectal anastomosis and diverting loop ileostomy on 11/14/10 at [facility] by Dr. Harlon Ditty. Pathology reveals a pT3b N1 with 3/20 lymph nodes.      has GERD; Rectal cancer ([place]); Hx of radiation therapy; Stiffness of joint, not elsewhere classified, ankle and foot; Weakness of both legs; Peripheral neuropathy due to oxaliplatin-chemotherapy; Diarrhea; Acute pulmonary embolism ([place]); Lactic acidosis; Lower abdominal pain; Urinary retention; Lung nodule, solitary; DVT (deep venous thrombosis) ([place]); Lung nodule; S/P partial lobectomy of lung; GI bleed; Chronic anticoagulation; Constipation; Heme positive stool; Gastritis; S/P thoracotomy; History of rectal cancer; Hiatal hernia; Schatzki's ring; Rectal pain; Loss of weight; Abnormal pancreas function test; Community acquired pneumonia of right lower lobe of lung ([place]); and Colon cancer ([place]) on his problem list.     is allergic to oxycodone; tramadol; and trazodone and nefazodone.  Mr. Thrush had no medications administered during this  visit.  Past Surgical History:  Procedure Laterality Date  . COLON SURGERY  11/14/2010   proctectomy with colorectal anastomosis and diverting loop ileostomy (temporary planned)  . COLONOSCOPY  07/2010   proximal rectal apple core mass 10-14cm from anal verge (adenocarcinoma), 2-3cm distal rectal carpet polyp s/p piecemeal snare polypectomy (adenoma)  . COLONOSCOPY  04/21/2012   RMR: Friable,fibrotic appearing colorectal anastomosis producing some luminal narrowing-not felt to be critical. path: focal erosion with slight inflammation and hyperemia. SURVEILLANCE DUE DEC 2015  . COLONOSCOPY N/A 11/24/2013   Dr. Rourk:somewhat fibrotic/friable anastomotic mucosal-status post biopsy (narrowing not felt to be clinically significant) Single colonic diverticulum. benign polypoid rectal mucosa  . colostomy reversal  april 2013  . ESOPHAGOGASTRODUODENOSCOPY  07/2010   RMR: schatki ring s/p dilation, small hh, SB bx benign  . ESOPHAGOGASTRODUODENOSCOPY N/A 09/04/2015   Procedure: ESOPHAGOGASTRODUODENOSCOPY (EGD);  Surgeon: Daneil Dolin, MD;  Location: [facility];  Service: Endoscopy;  Laterality: N/A;  730  . EUS  08/2010   Dr. Owens Loffler. uT3N0 circumferential, nearly obstruction rectosigmoid adenocarcinoma, distal edge 12cm from anal verge  . EUS N/A 06/19/2016   Procedure: UPPER ENDOSCOPIC ULTRASOUND (EUS) RADIAL;  Surgeon: Milus Banister, MD;  Location: [facility] ENDOS[facility];  Service: Endoscopy;  Laterality: N/A;  . HERNIA REPAIR     abd hernia repair  . IVC filter    . ivc filter    . port a cath placement    . PORT-A-CATH REMOVAL  09/24/2011   Procedure: REMOVAL PORT-A-CATH;  Surgeon: Donato Heinz, MD;  Location: [facility] ORS;  Service: General;  Laterality: N/A;  Minor Room  . PORTACATH PLACEMENT Right 07/07/2016   Procedure: INSERTION PORT-A-CATH;  Surgeon: Aviva Signs, MD;  Location: [facility] ORS;  Service: General;  Laterality: Right;  Marland Kitchen VIDEO ASSISTED THORACOSCOPY (VATS)/WEDGE RESECTION Right 11/15/2014     Procedure: VIDEO ASSISTED THORACOSCOPY (VATS)/LUNG RESECTION WITH RIGHT LINGULECTOMY;  Surgeon: [name], MD;  Location: [facility];  Service: Thoracic;  Laterality: Right;  Marland Kitchen VIDEO BRONCHOSCOPY N/A 11/15/2014   Procedure: VIDEO BRONCHOSCOPY;  Surgeon: [name], MD;  Location: [facility] OR;  Service: Thoracic;  Laterality: N/A;   Review of Systems  Constitutional: Positive for malaise/fatigue.  HENT: Negative.   Eyes: Negative.   Respiratory: Negative.   Cardiovascular: Negative.   Gastrointestinal: Negative for abdominal pain and diarrhea.  Genitourinary: Negative.   Musculoskeletal: Negative.   Skin: Negative.   Neurological: Negative.   Endo/Heme/Allergies: Negative.   Psychiatric/Behavioral: The patient has insomnia.   All other systems reviewed and are negative. [place]  point review of systems was performed and is negative except as detailed under history of present illness and above  PHYSICAL EXAMINATION  ECOG PERFORMANCE STATUS: 0 - Asymptomatic   Physical Exam  Constitutional: He is oriented to person, place, and time and well-developed, well-nourished, and in no distress.  HENT:  Head: Normocephalic and atraumatic.  Nose: Nose normal.  Mouth/Throat: Oropharynx is clear and moist. No oropharyngeal exudate.  Eyes: Conjunctivae and EOM are normal. Pupils are equal, round, and reactive to light. Right eye exhibits no discharge. Left eye exhibits no discharge. No scleral icterus.  Neck: Normal range of motion. Neck supple. No tracheal deviation present. No thyromegaly present.  Cardiovascular: Normal rate, regular rhythm and normal heart sounds.  Exam reveals no gallop and no friction rub.   No murmur heard. Pulmonary/Chest: Effort normal and breath sounds normal. He has no wheezes. He has no rales.  Abdominal: Soft. Bowel sounds are normal. He exhibits no distension and no mass. There is no tenderness. There is no rebound and no guarding.  Musculoskeletal: Normal range of  motion. He exhibits no edema.  Lymphadenopathy:    He has no cervical adenopathy.  Neurological: He is alert and oriented to person, place, and time. He has normal reflexes. No cranial nerve deficit. Gait normal. Coordination normal.  Skin: Skin is warm and dry. No rash noted.  Psychiatric: Mood, memory, affect and judgment normal.  Nursing note and vitals reviewed.  LABORATORY DATA: I have reviewed the data as listed.  CBC Latest Ref Rng & Units 10/28/2016 10/14/2016 09/30/2016  WBC 4.0 - 10.5 K/uL 4.4 4.4 4.7  Hemoglobin 13.0 - 17.0 g/dL 12.7(L) 12.0(L) 13.5  Hematocrit 39.0 - 52.0 % 38.3(L) 36.3(L) 40.5  Platelets 150 - 400 K/uL 129(L) 135(L) 147(L)   CMP Latest Ref Rng & Units 10/28/2016 10/14/2016 09/30/2016  Glucose 65 - 99 mg/dL 181(H) 96 105(H)  BUN 6 - 20 mg/dL _0 Creatinine 0.61 - 1.24 mg/dL 0.63 0.58(L) 0.68  Sodium 135 - 145 mmol/L 139 139 141  Potassium 3.5 - 5.1 mmol/L 3.3(L) 3.8 3.4(L)  Chloride 101 - 111 mmol/L 108 107 108  CO2 22 - 32 mmol/L _1 Calcium 8.9 - 10.3 mg/dL 8.1(L) 8.5(L) 8.7(L)  Total Protein 6.5 - 8.1 g/dL 5.8(L) 5.6(L) 6.2(L)  Total Bilirubin 0.3 - 1.2 mg/dL 1.0 0.9 1.6(H)  Alkaline Phos 38 - 126 U/L 70 69 71  AST 15 - 41 U/L _2 ALT 17 - 63 U/L _3 PATHOLOGY:   RADIOLOGY: I have personally reviewed the radiological images as listed and agreed with the findings in the report.  Initial PET 06/25/2016 IMPRESSION: 1. Two pleural-based nodules in the right hemithorax are hypermetabolic. Metastatic disease is a distinct consideration. The scattered pulmonary parenchymal nodule seen on previous diagnostic CT imaging are below the threshold for reliable resolution on PET imaging. 2. Hypermetabolic lesion pancreatic head, consistent with neoplasm. As noted on prior CT, adenocarcinoma is any consideration. No evidence for hypermetabolic abdominal lymphadenopathy. Mottled uptake noted in the liver, but no discrete hepatic  metastases are evident on PET imaging. 3. Appendix remains distended up to 09-10 mm diameter with a stone towards the tip in subtle periappendiceal edema/inflammation. The appendix is hypermetabolic along its length. Imaging features are relatively stable in the 12 day interval since prior CT scan. While appendicitis is a consideration, the relative stability over 12 days would be unusual for that etiology. Continued close follow-up recommended.  CT Head wo Contrast 06/17/2016 IMPRESSION: Normal exam except for atherosclerotic calcification of the major vessels at the base of the brain.   CT CHEST, ABDOMEN, AND PELVIS WITH CONTRAST 06/13/2016 IMPRESSION: 1. New right-sided pleural metastasis/mass. Other smaller right-sided pulmonary nodules which are all pleural-based and most likely represent pleural metastasis. 2. No convincing evidence of abdominopelvic nodal metastasis. 3. Constellation of findings, including pancreatic atrophy, duct dilatation, and pancreatic head soft tissue fullness which are highly suspicious for pancreatic adenocarcinoma. Metastatic disease felt much less likely. Consider endoscopic ultrasound sampling or ERCP. Cannot exclude superimposed acute pancreatitis. 4. New enlargement of the appendiceal tip with subtle surrounding edema. Cannot exclude early or mild appendicitis. 5.  Coronary artery atherosclerosis. Aortic atherosclerosis. 6. 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  CT HEAD WITHOUT CONTRAST 06/17/2016  IMPRESSION: Normal exam except for atherosclerotic calcification of the major vessels at the base of the brain.  NUCLEAR MEDICINE PET SKULL BASE TO THIGH 06/25/2016  IMPRESSION: 1. Two pleural-based nodules in the right hemithorax are hypermetabolic. Metastatic disease is a distinct consideration. The scattered pulmonary parenchymal nodule seen on previous diagnostic CT imaging are below the threshold for reliable  resolution on PET imaging. 2. Hypermetabolic lesion pancreatic head, consistent with neoplasm. As noted on prior CT, adenocarcinoma is any consideration. No evidence for hypermetabolic abdominal lymphadenopathy. Mottled uptake noted in the liver, but no discrete hepatic metastases are evident on PET imaging. 3. Appendix remains distended up to 09-10 mm diameter with a stone towards the tip in subtle periappendiceal edema/inflammation. The appendix is hypermetabolic along its length. Imaging features are relatively stable in the 12 day interval since prior CT scan. While appendicitis is a consideration, the relative stability over 12 days would be unusual for that etiology. Continued close follow-up recommended.  PORTABLE CHEST 1 VIEW 07/07/2016  IMPRESSION: Changes of known metastatic disease as well as new port placement. No pneumothorax is noted.  PORTABLE CHEST 1 VIEW 07/23/2016  IMPRESSION: Mild right basilar airspace opacity raises concern for pneumonia.  CT CHEST, ABDOMEN, AND PELVIS WITH CONTRAST 08/27/2016  IMPRESSION: 1. Pulmonary metastatic disease is stable. 2. Pancreatic head mass, grossly stable, with progressive atrophy of the body and tail of the pancreas. Stable peripancreatic lymph node. 3. Mild circumferential rectal wall thickening, stable. 4. Aortic atherosclerosis (ICD10-170.0). Coronary artery calcification. 5. Hyperattenuating lesion off the lower pole left kidney, stable, too small to characterize. Continued attention on followup exams is warranted. 6. Persistent wall thickening and mild dilatation of the proximal appendix, of uncertain etiology. Continued attention on followup exams is warranted.   ASSESSMENT AND PLAN:  Stage IV CRC s/p surgical resection of solitary pulmonary nodule on 11/16/2014 Bronchoscopy, R VATS, wedge resection RML 11/16/2014 with Dr. Servando Snare History of Stage III CRC Treatment related neuropathy History of IVC filter in  2012 Acute DVT/PE 09/2014, s/p catheter lysis of DVT XARELTO Anxiety on Lexapro GERD  CT scans reviewed with the patient and his wife. Pancreatic mass and pulmonary mets are stable in size, no evidence of new metastatic disease.   Continue chemotherapy with FOLFOX and avastin q2weeks. Proceed with cycle 10 today. Will give him a 500 ml bolus of NS to help with hydration. Advised him to make sure he drinks 6-8 glasses of fluids daily.  Recommended him to go back on metamucil to help bulk up his stools so he may pass them easier.  Take ativan PRN prior to bedtime to help with his anxiety so he can sleep at night.  RTC in 4 weeks for follow up.    All questions were answered. The patient knows to call the clinic with any problems, questions or concerns. We can certainly see the patient much sooner if necessary.    This note was electronically signed.   Twana First, MD

## 2016-11-13 ENCOUNTER — Encounter (HOSPITAL_BASED_OUTPATIENT_CLINIC_OR_DEPARTMENT_OTHER): Payer: Medicare Other

## 2016-11-13 ENCOUNTER — Encounter (HOSPITAL_COMMUNITY): Payer: Self-pay

## 2016-11-13 VITALS — BP 147/79 | HR 70 | Temp 98.0°F | Resp 18

## 2016-11-13 DIAGNOSIS — C20 Malignant neoplasm of rectum: Secondary | ICD-10-CM

## 2016-11-13 MED ORDER — HEPARIN SOD (PORK) LOCK FLUSH 100 UNIT/ML IV SOLN
500.0000 [IU] | Freq: Once | INTRAVENOUS | Status: AC | PRN
  Administered 2016-11-13: 500 [IU]

## 2016-11-13 MED ORDER — SODIUM CHLORIDE 0.9% FLUSH
10.0000 mL | INTRAVENOUS | Status: DC | PRN
  Administered 2016-11-13: 10 mL
  Filled 2016-11-13: qty 10

## 2016-11-13 MED ORDER — HEPARIN SOD (PORK) LOCK FLUSH 100 UNIT/ML IV SOLN
INTRAVENOUS | Status: AC
  Filled 2016-11-13: qty 5

## 2016-11-13 NOTE — Patient Instructions (Signed)
[place] [facility] Discharge Instructions  RECOMMENDATIONS MADE BY THE CONSULTANT AND ANY TEST RESULTS WILL BE SENT TO YOUR REFERRING PHYSICIAN.  5FU pump discontinued with port flushed per protocol today. Follow-up as scheduled. Call clinic for any questions or concerns  Thank you for choosing [facility] at [facility] to provide your oncology and hematology care.  To afford each patient quality time with our provider, please arrive at least 15 minutes before your scheduled appointment time.    If you have a lab appointment with the [facility] please come in thru the  Main Entrance and check in at the main information desk  You need to re-schedule your appointment should you arrive 10 or more minutes late.  We strive to give you quality time with our providers, and arriving late affects you and other patients whose appointments are after yours.  Also, if you no show three or more times for appointments you may be dismissed from the clinic at the providers discretion.     Again, thank you for choosing [facility].  Our hope is that these requests will decrease the amount of time that you wait before being seen by our physicians.       _____________________________________________________________  Should you have questions after your visit to [facility], please contact our office at [phone] between the hours of 8:30 a.m. and 4:30 p.m.  Voicemails left after 4:30 p.m. will not be returned until the following business day.  For prescription refill requests, have your pharmacy contact our office.       Resources For Cancer Patients and their Caregivers ? American Cancer Society: Can assist with transportation, wigs, general needs, runs Look Good Feel Better.        (212) 015-3508 ? Cancer Care: Provides financial assistance, online support groups, medication/co-pay assistance.  1-800-813-HOPE  561-620-7067) ? [employer] Assists [place] cancer patients and their families through emotional , educational and financial support.  (425)158-0425 ? [place] DSS Where to apply for food stamps, Medicaid and utility assistance. 915-824-4519 ? [employer]: Transportation to medical appointments. (202) 244-1783 ? Social Security Administration: May apply for disability if have a Stage IV cancer. 563-046-9755 219-882-0481 ? [employer], Disability and Transit Services: Assists with nutrition, care and transit needs. [place] Support Programs: @10RELATIVEDAYS @ > Cancer Support Group  2nd Tuesday of the month 1pm-2pm, Journey Room  > Creative Journey  3rd Tuesday of the month 1130am-1pm, Journey Room  > Look Good Feel Better  1st Wednesday of the month 10am-12 noon, Journey Room (Call [place] to register [phone])

## 2016-11-13 NOTE — Progress Notes (Signed)
Charles [name] tolerated discontinuation of 5FU pump with port flush well without complaints or incident.Port flushed with 10 ml NS and 5 ml Heparin easily per protocol then de-accessed. VSS Pt did complain of his throat burning a little today but denies any sores. He will start using his magic mouthwash as prescribed.when he return home. Pt discharged self ambulatory in satisfactory condition accompanied by his wife

## 2016-11-25 ENCOUNTER — Encounter (HOSPITAL_COMMUNITY): Payer: Self-pay

## 2016-11-25 ENCOUNTER — Encounter (HOSPITAL_COMMUNITY): Payer: Medicare Other | Admitting: Dietician

## 2016-11-25 ENCOUNTER — Encounter (HOSPITAL_BASED_OUTPATIENT_CLINIC_OR_DEPARTMENT_OTHER): Payer: Medicare Other

## 2016-11-25 VITALS — BP 151/92 | HR 75 | Temp 97.5°F | Resp 18 | Wt 204.0 lb

## 2016-11-25 DIAGNOSIS — C7801 Secondary malignant neoplasm of right lung: Secondary | ICD-10-CM

## 2016-11-25 DIAGNOSIS — C20 Malignant neoplasm of rectum: Secondary | ICD-10-CM

## 2016-11-25 DIAGNOSIS — C7889 Secondary malignant neoplasm of other digestive organs: Secondary | ICD-10-CM | POA: Diagnosis not present

## 2016-11-25 DIAGNOSIS — Z5112 Encounter for antineoplastic immunotherapy: Secondary | ICD-10-CM

## 2016-11-25 LAB — CBC WITH DIFFERENTIAL/PLATELET
Basophils Absolute: 0 10*3/uL (ref 0.0–0.1)
Basophils Relative: 0 %
Eosinophils Absolute: 0.1 10*3/uL (ref 0.0–0.7)
Eosinophils Relative: 2 %
HCT: 38.4 % — ABNORMAL LOW (ref 39.0–52.0)
Hemoglobin: 12.8 g/dL — ABNORMAL LOW (ref 13.0–17.0)
Lymphocytes Relative: 31 %
Lymphs Abs: 1.4 10*3/uL (ref 0.7–4.0)
MCH: 29.9 pg (ref 26.0–34.0)
MCHC: 33.3 g/dL (ref 30.0–36.0)
MCV: 89.7 fL (ref 78.0–100.0)
Monocytes Absolute: 0.4 10*3/uL (ref 0.1–1.0)
Monocytes Relative: 10 %
Neutro Abs: 2.6 10*3/uL (ref 1.7–7.7)
Neutrophils Relative %: 58 %
Platelets: 114 10*3/uL — ABNORMAL LOW (ref 150–400)
RBC: 4.28 MIL/uL (ref 4.22–5.81)
RDW: 15.2 % (ref 11.5–15.5)
WBC: 4.5 10*3/uL (ref 4.0–10.5)

## 2016-11-25 LAB — URINALYSIS, DIPSTICK ONLY
Bilirubin Urine: NEGATIVE
Glucose, UA: NEGATIVE mg/dL
Hgb urine dipstick: NEGATIVE
Ketones, ur: NEGATIVE mg/dL
Leukocytes, UA: NEGATIVE
Nitrite: NEGATIVE
Protein, ur: NEGATIVE mg/dL
Specific Gravity, Urine: >1.03 — ABNORMAL HIGH (ref 1.005–1.030)
pH: 5.5 (ref 5.0–8.0)

## 2016-11-25 LAB — COMPREHENSIVE METABOLIC PANEL
ALT: 19 U/L (ref 17–63)
AST: 23 U/L (ref 15–41)
Albumin: 3.3 g/dL — ABNORMAL LOW (ref 3.5–5.0)
Alkaline Phosphatase: 64 U/L (ref 38–126)
Anion gap: 8 (ref 5–15)
BUN: 7 mg/dL (ref 6–20)
CO2: 24 mmol/L (ref 22–32)
Calcium: 8.4 mg/dL — ABNORMAL LOW (ref 8.9–10.3)
Chloride: 108 mmol/L (ref 101–111)
Creatinine, Ser: 0.79 mg/dL (ref 0.61–1.24)
GFR calc Af Amer: >60 mL/min (ref >60)
GFR calc non Af Amer: >60 mL/min (ref >60)
Glucose, Bld: 204 mg/dL — ABNORMAL HIGH (ref 65–99)
Potassium: 3.8 mmol/L (ref 3.5–5.1)
Sodium: 140 mmol/L (ref 135–145)
Total Bilirubin: 1.4 mg/dL — ABNORMAL HIGH (ref 0.3–1.2)
Total Protein: 5.8 g/dL — ABNORMAL LOW (ref 6.5–8.1)

## 2016-11-25 MED ORDER — DIPHENHYDRAMINE HCL 50 MG/ML IJ SOLN
50.0000 mg | Freq: Once | INTRAMUSCULAR | Status: AC
  Administered 2016-11-25: 50 mg via INTRAVENOUS
  Filled 2016-11-25: qty 1

## 2016-11-25 MED ORDER — DEXTROSE 5 % IV SOLN
Freq: Once | INTRAVENOUS | Status: AC
  Administered 2016-11-25: 12:00:00 via INTRAVENOUS

## 2016-11-25 MED ORDER — FLUOROURACIL CHEMO INJECTION 2.5 GM/50ML
400.0000 mg/m2 | Freq: Once | INTRAVENOUS | Status: AC
  Administered 2016-11-25: 900 mg via INTRAVENOUS
  Filled 2016-11-25: qty 18

## 2016-11-25 MED ORDER — PROCHLORPERAZINE MALEATE 10 MG PO TABS
ORAL_TABLET | ORAL | Status: AC
  Filled 2016-11-25: qty 1

## 2016-11-25 MED ORDER — SODIUM CHLORIDE 0.9 % IV SOLN
5.5000 mg/kg | Freq: Once | INTRAVENOUS | Status: AC
  Administered 2016-11-25: 500 mg via INTRAVENOUS
  Filled 2016-11-25: qty 16

## 2016-11-25 MED ORDER — SODIUM CHLORIDE 0.9% FLUSH
10.0000 mL | INTRAVENOUS | Status: DC | PRN
  Administered 2016-11-25: 10 mL
  Filled 2016-11-25: qty 10

## 2016-11-25 MED ORDER — SODIUM CHLORIDE 0.9 % IV SOLN
INTRAVENOUS | Status: DC
  Administered 2016-11-25: 11:00:00 via INTRAVENOUS

## 2016-11-25 MED ORDER — OXALIPLATIN CHEMO INJECTION 100 MG/20ML
85.0000 mg/m2 | Freq: Once | INTRAVENOUS | Status: AC
  Administered 2016-11-25: 185 mg via INTRAVENOUS
  Filled 2016-11-25: qty 37

## 2016-11-25 MED ORDER — PROCHLORPERAZINE MALEATE 10 MG PO TABS
10.0000 mg | ORAL_TABLET | Freq: Once | ORAL | Status: AC
  Administered 2016-11-25: 10 mg via ORAL

## 2016-11-25 MED ORDER — DEXAMETHASONE SODIUM PHOSPHATE 10 MG/ML IJ SOLN
10.0000 mg | Freq: Once | INTRAMUSCULAR | Status: AC
  Administered 2016-11-25: 10 mg via INTRAVENOUS
  Filled 2016-11-25: qty 1

## 2016-11-25 MED ORDER — SODIUM CHLORIDE 0.9 % IV SOLN: 10.0000 mg | Freq: Once | INTRAVENOUS | Status: DC

## 2016-11-25 MED ORDER — SODIUM CHLORIDE 0.9 % IV SOLN
2400.0000 mg/m2 | INTRAVENOUS | Status: DC
  Administered 2016-11-25: 5300 mg via INTRAVENOUS
  Filled 2016-11-25: qty 100

## 2016-11-25 MED ORDER — PALONOSETRON HCL INJECTION 0.25 MG/5ML
0.2500 mg | Freq: Once | INTRAVENOUS | Status: AC
  Administered 2016-11-25: 0.25 mg via INTRAVENOUS
  Filled 2016-11-25: qty 5

## 2016-11-25 MED ORDER — LEUCOVORIN CALCIUM INJECTION 350 MG
400.0000 mg/m2 | Freq: Once | INTRAVENOUS | Status: AC
  Administered 2016-11-25: 880 mg via INTRAVENOUS
  Filled 2016-11-25: qty 10

## 2016-11-25 NOTE — Progress Notes (Signed)
Charles [name] tolerated chemo tx well with complaint of mild nausea during the Oxaliplatin and Leucovorin infusion. Dr Talbert Cage notified and order obtained for Compazine 10 mg PO to be given once per MD. Compazine was effective. Labs and urine protein reviewed with Dr. Talbert Cage prior to administering chemotherapy. Pt discharged with 5FU pump infusing without issues. VSS upon discharge. Pt discharged self ambulatory in satisfactory condition accompanied by his wife

## 2016-11-25 NOTE — Patient Instructions (Signed)
[facility] Discharge Instructions for Patients Receiving Chemotherapy   Beginning January 23rd 2017 lab work for the [facility] will be done in the  Main lab at [facility] on 1st floor. If you have a lab appointment with the [facility] please come in thru the  Main Entrance and check in at the main information desk   Today you received the following chemotherapy agents Avastin, Oxaliplatin, Leucovorin and 5FU. Follow-up as scheduled. Call clinic for any questions or concerns  To help prevent nausea and vomiting after your treatment, we encourage you to take your nausea medication   If you develop nausea and vomiting, or diarrhea that is not controlled by your medication, call the clinic.  The clinic phone number is [phone]. Office hours are Monday-Friday 8:30am-5:00pm.  BELOW ARE SYMPTOMS THAT SHOULD BE REPORTED IMMEDIATELY:  *FEVER GREATER THAN 101.0 F  *CHILLS WITH OR WITHOUT FEVER  NAUSEA AND VOMITING THAT IS NOT CONTROLLED WITH YOUR NAUSEA MEDICATION  *UNUSUAL SHORTNESS OF BREATH  *UNUSUAL BRUISING OR BLEEDING  TENDERNESS IN MOUTH AND THROAT WITH OR WITHOUT PRESENCE OF ULCERS  *URINARY PROBLEMS  *BOWEL PROBLEMS  UNUSUAL RASH Items with * indicate a potential emergency and should be followed up as soon as possible. If you have an emergency after office hours please contact your primary care physician or go to the nearest emergency department.  Please call the clinic during office hours if you have any questions or concerns.   You may also contact the Patient Navigator at [phone] should you have any questions or need assistance in obtaining follow up care.      Resources For Cancer Patients and their Caregivers ? American Cancer Society: Can assist with transportation, wigs, general needs, runs Look Good Feel Better.        8650908977 ? Cancer Care: Provides financial assistance, online support groups, medication/co-pay  assistance.  1-800-813-HOPE (507)113-5395) ? [place] Assists [place] cancer patients and their families through emotional , educational and financial support.  417-748-4360 ? [place] DSS Where to apply for food stamps, Medicaid and utility assistance. 364-356-7500 ? [employer]: Transportation to medical appointments. (814)560-8125 ? Social Security Administration: May apply for disability if have a Stage IV cancer. ([phone] 9307100021 ? [employer], Disability and Transit Services: Assists with nutrition, care and transit needs. [phone]

## 2016-11-25 NOTE — Progress Notes (Signed)
Nutrition Assessment  Reason for Assessment: MST trigger  ASSESSMENT: 66 y/o male PMHx HTN, GERD, DM, Depression, PVD and Stage IV rectal cancer with metastases to lungs & pancreas. Currently undergoing chemotherapy FOLFOX and Avastin q 2 weeks.   Patient reports consistently eating Breakfast, Lunch and Dinner. Additionally, he will snack from [place] until bed time. His appetite has remained strong, with the exception of 1-2 week ago where "I lost my taste". The only foods he could taste were sweet foods. He says he lost from 208 to 203 lbs because of his taste loss. This was his only instance of taste loss since he started this chemo regimen  By far his largest problem is chronic, constant diarrhea. He has 10-15 loose stools a day. He says these are extremely painful; "like someone is dragging a barbed wire through my hind end". He has been inserting nitroglycerin ointment into his rectal vault with relief, but says this is short lived because he soon after has another BM that expells the medication. He says he has a very large blister on his back side caused from his diarrhea. His wife says he screams in pain when he passes stool. His wife says that it negatively affects his appetite, hydration, energy level and mood.   He has tried imodium, but didn't take it too often as he is "scared of getting constipated".   He cannot eat/drink anything cold due to FOLFOX  Nutrition Focused Physical Exam: WDL  Medications: Nitroglycerin ointment, lomotil, hydrocortisone ointment, fentanyl patches, Klonopin, Amaryl, Remeron, Zofran, Compazine,  prilosec, lidocaine jelly, zinc oxide ointment  Labs: Glu: 204, Total pro 5.8, Albumin: 3.3   Recent Labs Lab 11/25/16 0935  NA 140  K 3.8  CL 108  CO2 24  BUN 7  CREATININE 0.79  CALCIUM 8.4*  GLUCOSE 204*   Anthropometrics:  Height: 6\' 2"  (187.96 cm) Weight: 204 lbs (92.73 kg) BMI: 26.2  Estimated Energy Needs Kcals: 2300-2500 (25-27 kcal/kg  bw) Protein: 110-130g Fluid: 2.3-2.5 liters, plus enough to replace stool losses  NUTRITION DIAGNOSIS: Increased Protein calorie needs related to wound healing, cancer and cancer related treatments as evidenced by the estimated requirements for these outcomes and treatments  MALNUTRITION DIAGNOSIS: N/A  INTERVENTION:   Though his week of taste loss was an anomaly, we discussed ways to overcome his loss of his taste. He has already discovered that sweet foods retain their taste. Suggested using marinades to flavor  High protein items such as meat. Cheese cake was suggested as a sweet food that has protein. RD also suggested adding sweet sauces to foods, but he says he has a hard time tolerating anything with spice/tomatoes. Alternatively, when he has a lost of taste, this may be best time to drink supplements.   RD went over the Ensure Assistance program. He was interested and would like a case now. RD to order, this is his first case of ensure.   He has a large blister/wound as a result of his diarrhea. Reinforced need for increased kcal/pro to help this heal. However, he needs to get his diarrhea better controlled as well.   Given his persistent diarrhea, we focused a lot on hydration. He is drinking 2 16 oz bottles of water, 32 oz of coffee and then 2-3 other misc beverages per day (gatorade, ensure, soda). He used to drink 1 gallon of whole milk a week, but he can no longer do this due to cold intolerance. He says there are 2 days every other week  when he can tolerate cold and this is when he eats icecream, cold cereal etc.   We went over some ways to remind himself to drink fluids, such as keeping water bottles next to himself or utilizing alarms on a phone to signal to drink fluids. He says he keeps a bottle of water on each side of him throughout the day. He says his urine stays clear for the most part. He knows coffee is not a great choice of beverage, but this is a deeply solidified habit.  Recommended G2 as opposed to regular gatorade. RD left handout titled "Dehydration" and highlighted certain recommendations.    He was recently given lomotil to try. Encouraged him to be compliant with this and f/u with GI if does not help.   RD forwarded patient profound bowel complaints to NP. Unfortunately, much of his diarrhea is felt attributable to malignancy and, as such, is extremely difficult to ammeliorate  MONITORING, EVALUATION, GOAL: Weight, hydration, bowel frequence  NEXT VISIT:3-4 weeks  Burtis Junes RD, LDN, CNSC Clinical Nutrition Pager: [phone] 11/25/2016 11:07 AM

## 2016-11-26 LAB — CEA: CEA: 2.1 ng/mL (ref 0.0–4.7)

## 2016-11-27 ENCOUNTER — Encounter (HOSPITAL_COMMUNITY): Payer: Self-pay

## 2016-11-27 ENCOUNTER — Encounter (HOSPITAL_BASED_OUTPATIENT_CLINIC_OR_DEPARTMENT_OTHER): Payer: Medicare Other

## 2016-11-27 ENCOUNTER — Ambulatory Visit: Payer: Medicare Other | Admitting: Gastroenterology

## 2016-11-27 VITALS — BP 146/90 | HR 83 | Temp 98.0°F | Resp 18

## 2016-11-27 DIAGNOSIS — Z452 Encounter for adjustment and management of vascular access device: Secondary | ICD-10-CM | POA: Diagnosis present

## 2016-11-27 DIAGNOSIS — C20 Malignant neoplasm of rectum: Secondary | ICD-10-CM | POA: Diagnosis not present

## 2016-11-27 MED ORDER — HEPARIN SOD (PORK) LOCK FLUSH 100 UNIT/ML IV SOLN
500.0000 [IU] | Freq: Once | INTRAVENOUS | Status: AC | PRN
  Administered 2016-11-27: 500 [IU]

## 2016-11-27 MED ORDER — SODIUM CHLORIDE 0.9% FLUSH
10.0000 mL | INTRAVENOUS | Status: DC | PRN
  Administered 2016-11-27: 10 mL
  Filled 2016-11-27: qty 10

## 2016-11-27 NOTE — Progress Notes (Signed)
Charles [name] presents to have home infusion pump d/c'd and for port-a-cath deaccess with flush.  Portacath located right chest wall accessed with  H 20 needle.  Good blood return present. Portacath flushed with NS and 500U/34ml Heparin, and needle removed intact.  Procedure tolerated well and without incident.  Discharged ambulatory in c/o spouse.

## 2016-12-08 DIAGNOSIS — I82409 Acute embolism and thrombosis of unspecified deep veins of unspecified lower extremity: Secondary | ICD-10-CM | POA: Diagnosis not present

## 2016-12-08 DIAGNOSIS — G4700 Insomnia, unspecified: Secondary | ICD-10-CM | POA: Diagnosis not present

## 2016-12-08 DIAGNOSIS — C20 Malignant neoplasm of rectum: Secondary | ICD-10-CM | POA: Diagnosis not present

## 2016-12-08 DIAGNOSIS — E1165 Type 2 diabetes mellitus with hyperglycemia: Secondary | ICD-10-CM | POA: Diagnosis not present

## 2016-12-08 DIAGNOSIS — K6289 Other specified diseases of anus and rectum: Secondary | ICD-10-CM | POA: Diagnosis not present

## 2016-12-08 DIAGNOSIS — Z299 Encounter for prophylactic measures, unspecified: Secondary | ICD-10-CM | POA: Diagnosis not present

## 2016-12-08 DIAGNOSIS — C189 Malignant neoplasm of colon, unspecified: Secondary | ICD-10-CM | POA: Diagnosis not present

## 2016-12-08 DIAGNOSIS — Z6824 Body mass index (BMI) 24.0-24.9, adult: Secondary | ICD-10-CM | POA: Diagnosis not present

## 2016-12-08 DIAGNOSIS — C799 Secondary malignant neoplasm of unspecified site: Secondary | ICD-10-CM | POA: Diagnosis not present

## 2016-12-09 ENCOUNTER — Encounter (HOSPITAL_COMMUNITY): Payer: Medicare Other | Attending: Oncology

## 2016-12-09 ENCOUNTER — Encounter (HOSPITAL_BASED_OUTPATIENT_CLINIC_OR_DEPARTMENT_OTHER): Payer: Medicare Other | Admitting: Oncology

## 2016-12-09 VITALS — BP 155/85 | HR 101 | Temp 97.6°F | Resp 20 | Wt 200.0 lb

## 2016-12-09 VITALS — BP 150/82 | HR 71 | Temp 97.6°F | Resp 18

## 2016-12-09 DIAGNOSIS — C20 Malignant neoplasm of rectum: Secondary | ICD-10-CM

## 2016-12-09 DIAGNOSIS — C7801 Secondary malignant neoplasm of right lung: Secondary | ICD-10-CM

## 2016-12-09 DIAGNOSIS — R530 Neoplastic (malignant) related fatigue: Secondary | ICD-10-CM

## 2016-12-09 DIAGNOSIS — G620 Drug-induced polyneuropathy: Secondary | ICD-10-CM | POA: Diagnosis not present

## 2016-12-09 DIAGNOSIS — Z5111 Encounter for antineoplastic chemotherapy: Secondary | ICD-10-CM | POA: Diagnosis not present

## 2016-12-09 DIAGNOSIS — E860 Dehydration: Secondary | ICD-10-CM

## 2016-12-09 DIAGNOSIS — K521 Toxic gastroenteritis and colitis: Secondary | ICD-10-CM | POA: Diagnosis not present

## 2016-12-09 LAB — COMPREHENSIVE METABOLIC PANEL
ALT: 17 U/L (ref 17–63)
AST: 23 U/L (ref 15–41)
Albumin: 3.3 g/dL — ABNORMAL LOW (ref 3.5–5.0)
Alkaline Phosphatase: 64 U/L (ref 38–126)
Anion gap: 8 (ref 5–15)
BUN: 8 mg/dL (ref 6–20)
CO2: 25 mmol/L (ref 22–32)
Calcium: 8.5 mg/dL — ABNORMAL LOW (ref 8.9–10.3)
Chloride: 105 mmol/L (ref 101–111)
Creatinine, Ser: 0.74 mg/dL (ref 0.61–1.24)
GFR calc Af Amer: >60 mL/min (ref >60)
GFR calc non Af Amer: >60 mL/min (ref >60)
Glucose, Bld: 199 mg/dL — ABNORMAL HIGH (ref 65–99)
Potassium: 3.3 mmol/L — ABNORMAL LOW (ref 3.5–5.1)
Sodium: 138 mmol/L (ref 135–145)
Total Bilirubin: 1.3 mg/dL — ABNORMAL HIGH (ref 0.3–1.2)
Total Protein: 6.1 g/dL — ABNORMAL LOW (ref 6.5–8.1)

## 2016-12-09 LAB — CBC WITH DIFFERENTIAL/PLATELET
Basophils Absolute: 0 10*3/uL (ref 0.0–0.1)
Basophils Relative: 0 %
Eosinophils Absolute: 0.1 10*3/uL (ref 0.0–0.7)
Eosinophils Relative: 2 %
HCT: 38.7 % — ABNORMAL LOW (ref 39.0–52.0)
Hemoglobin: 12.8 g/dL — ABNORMAL LOW (ref 13.0–17.0)
Lymphocytes Relative: 31 %
Lymphs Abs: 1.4 10*3/uL (ref 0.7–4.0)
MCH: 29.7 pg (ref 26.0–34.0)
MCHC: 33.1 g/dL (ref 30.0–36.0)
MCV: 89.8 fL (ref 78.0–100.0)
Monocytes Absolute: 0.5 10*3/uL (ref 0.1–1.0)
Monocytes Relative: 11 %
Neutro Abs: 2.5 10*3/uL (ref 1.7–7.7)
Neutrophils Relative %: 56 %
Platelets: 128 10*3/uL — ABNORMAL LOW (ref 150–400)
RBC: 4.31 MIL/uL (ref 4.22–5.81)
RDW: 15.5 % (ref 11.5–15.5)
WBC: 4.5 10*3/uL (ref 4.0–10.5)

## 2016-12-09 LAB — URINALYSIS, DIPSTICK ONLY
Glucose, UA: 100 mg/dL — AB
Hgb urine dipstick: NEGATIVE
Leukocytes, UA: NEGATIVE
Nitrite: NEGATIVE
Protein, ur: 30 mg/dL — AB
Specific Gravity, Urine: >1.03 — ABNORMAL HIGH (ref 1.005–1.030)
pH: 5 (ref 5.0–8.0)

## 2016-12-09 MED ORDER — DEXAMETHASONE SODIUM PHOSPHATE 10 MG/ML IJ SOLN
10.0000 mg | Freq: Once | INTRAMUSCULAR | Status: AC
Start: 2016-12-09 — End: 2016-12-09
  Administered 2016-12-09: 10 mg via INTRAVENOUS
  Filled 2016-12-09: qty 1

## 2016-12-09 MED ORDER — SODIUM CHLORIDE 0.9 % IV SOLN
INTRAVENOUS | Status: DC
  Administered 2016-12-09: 10:00:00 via INTRAVENOUS

## 2016-12-09 MED ORDER — SODIUM CHLORIDE 0.9% FLUSH
10.0000 mL | INTRAVENOUS | Status: DC | PRN
  Administered 2016-12-09: 10 mL
  Filled 2016-12-09: qty 10

## 2016-12-09 MED ORDER — SODIUM CHLORIDE 0.9 % IV SOLN
5.4000 mg/kg | Freq: Once | INTRAVENOUS | Status: AC
  Administered 2016-12-09: 500 mg via INTRAVENOUS
  Filled 2016-12-09: qty 4

## 2016-12-09 MED ORDER — DEXTROSE 5 % IV SOLN
Freq: Once | INTRAVENOUS | Status: AC
  Administered 2016-12-09: 12:00:00 via INTRAVENOUS

## 2016-12-09 MED ORDER — OXALIPLATIN CHEMO INJECTION 100 MG/20ML
65.0000 mg/m2 | Freq: Once | INTRAVENOUS | Status: AC
  Administered 2016-12-09: 145 mg via INTRAVENOUS
  Filled 2016-12-09: qty 20

## 2016-12-09 MED ORDER — LEUCOVORIN CALCIUM INJECTION 350 MG
295.0000 mg/m2 | Freq: Once | INTRAVENOUS | Status: AC
  Administered 2016-12-09: 650 mg via INTRAVENOUS
  Filled 2016-12-09: qty 32.5

## 2016-12-09 MED ORDER — SODIUM CHLORIDE 0.9 % IV SOLN
1800.0000 mg/m2 | INTRAVENOUS | Status: DC
  Administered 2016-12-09: 3950 mg via INTRAVENOUS
  Filled 2016-12-09: qty 79

## 2016-12-09 MED ORDER — DIPHENHYDRAMINE HCL 50 MG/ML IJ SOLN
50.0000 mg | Freq: Once | INTRAMUSCULAR | Status: AC
  Administered 2016-12-09: 50 mg via INTRAVENOUS
  Filled 2016-12-09: qty 1

## 2016-12-09 MED ORDER — PALONOSETRON HCL INJECTION 0.25 MG/5ML
0.2500 mg | Freq: Once | INTRAVENOUS | Status: AC
  Administered 2016-12-09: 0.25 mg via INTRAVENOUS
  Filled 2016-12-09: qty 5

## 2016-12-09 MED ORDER — FLUOROURACIL CHEMO INJECTION 2.5 GM/50ML
300.0000 mg/m2 | Freq: Once | INTRAVENOUS | Status: AC
  Administered 2016-12-09: 650 mg via INTRAVENOUS
  Filled 2016-12-09: qty 13

## 2016-12-09 NOTE — Progress Notes (Signed)
PROGRESS NOTE   Charles Chroman, MD [address] [place] [address]  History of Stage III cancer of the rectum status post preoperative radiation and chemotherapy followed by definitive surgery. He then underwent adjuvant chemotherapy with FOLFOX. During the radiation therapy and continuous infusion 5-FU from 09/09/2010 through 10/10/2010. FOLFOX was administered from October 2012 through 07/14/2011.  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      11/14/2010 Surgery    S/P proctectomy with colorectal anastomosis and diverting loop ileostomy on 11/14/10 at [facility] by Dr. Harlon Ditty. Pathology reveals a pT3b N1 with 3/20 lymph nodes.      02/05/2011 - 07/14/2011 Chemotherapy    FOLFOX      08/18/2011 Surgery    Approximate date of surgery- [place] by Dr. Harlon Ditty       Remission         11/24/2013 Survivorship    Colonoscopy- somewhat fibrotic/friable anastomotic mucosal-status post biopsy (narrowing not felt to be clinically significant).  Negative pathology for malignancy      09/12/2014 Imaging    DVT in the left femoral venous system, left common iliac vein, IVC, and within the IVC filter Right lower extremity venography confirms chronic occlusion of the femoral venous system with collateralization. The right iliac venous system is patent and do      09/25/2014 PET scan    The right middle lobe pulmonary nodule is hypermetabolic, favored to represent a primary bronchogenic carcinoma.Equivocal mediastinal nodes, similar to surrounding blood pool. Bilateral adrenal hypermetabolism, felt to be physiologic      10/24/2014 Pathology Results    Lung, needle/core  biopsy(ies), RML - ADENOCARCINOMA, SEE COMMENT metastatic adenocarcinoma of a colorectal primary      10/24/2014 Relapse/Recurrence         11/16/2014 Definitive Surgery    Bronchoscopy, right video-assisted thoracoscopy, wedge resection of right middle lobe by Dr. Servando Snare      11/16/2014 Pathology Results    Lung, wedge biopsy/resection, right lingula and small portion of middle lobe - METASTATIC ADENOCARCINOMA, CONSISTENT WITH COLORECTAL PRIMARY, SPANNING 2.0 CM. - THE SURGICAL RESECTION MARGINS ARE NEGATIVE FOR ADENOCARCINOMA.      11/16/2014 Remission    THE SURGICAL RESECTION MARGINS ARE NEGATIVE FOR ADENOCARCINOMA.      03/07/2015 Imaging    CT CAP- Interval resection of right middle lobe metastasis. No acute process or evidence of metastatic disease in the chest, abdomen or pelvis. Improved right upper lobe reticular nodular opacities are favored to represent resolving infection.      09/14/2015 Imaging    CT CAP- No findings of recurrent malignancy. No recurrence along the wedge resection site of the right middle lobe. Right anterior abdominal wall focal hernia containing a knuckle of small bowel without complicating feature.      06/13/2016 Imaging    CT CAP- 1. New right-sided pleural metastasis/mass. Other smaller right-sided pulmonary nodules which are all pleural-based and most likely represent pleural metastasis. 2. No convincing evidence of abdominopelvic nodal metastasis. 3. Constellation of findings, including pancreatic atrophy, duct dilatation, and pancreatic head soft tissue fullness which are highly suspicious for pancreatic adenocarcinoma. Metastatic disease felt much less likely.  Consider endoscopic ultrasound sampling or ERCP. Cannot exclude superimposed acute pancreatitis. 4. New enlargement of the appendiceal tip with subtle surrounding edema. Cannot exclude early or mild appendicitis. 5. Coronary artery atherosclerosis. Aortic atherosclerosis. 6. Partial  anomalous pulmonary venous return from the left upper lobe.      06/13/2016 Progression    CT scan demonstrates progression of disease      06/19/2016 Procedure    EUS with FNA by Dr. Ardis Hughs      06/20/2016 Pathology Results    FINE NEEDLE ASPIRATION, ENDOSCOPIC, PANCREAS UNCINATE AREA(SPECIMEN 1 OF 1 COLLECTED 06/19/16): MALIGNANT CELLS CONSISTENT WITH METASTATIC ADENOCARCINOMA.      06/25/2016 PET scan    1. Two pleural-based nodules in the right hemithorax are hypermetabolic. Metastatic disease is a distinct consideration. The scattered pulmonary parenchymal nodule seen on previous diagnostic CT imaging are below the threshold for reliable resolution on PET imaging. 2. Hypermetabolic lesion pancreatic head, consistent with neoplasm. As noted on prior CT, adenocarcinoma is any consideration. No evidence for hypermetabolic abdominal lymphadenopathy. Mottled uptake noted in the liver, but no discrete hepatic metastases are evident on PET imaging. 3. Appendix remains distended up to 0.9-10 mm diameter with a stone towards the tip in subtle periappendiceal edema/inflammation. The appendix is hypermetabolic along its length. Imaging features are relatively stable in the 12 day interval since prior CT scan. While appendicitis is a consideration, the relative stability over 12 days would be unusual for that etiology. Continued close follow-up recommended.      06/26/2016 Pathology Results    Not enough tissue for foundationONE or K-ras testing.      07/07/2016 Procedure    Port placed by Dr. Arnoldo Morale      07/08/2016 -  Chemotherapy    The patient had pegfilgrastim (NEULASTA) injection 6 mg, 6 mg, Subcutaneous,  Once, 0 of 4 cycles  ondansetron (ZOFRAN) IVPB 8 mg, 8 mg, Intravenous,  Once, 0 of 4 cycles  leucovorin 804 mg in dextrose 5 % 250 mL infusion, 400 mg/m2, Intravenous,  Once, 0 of 4 cycles  oxaliplatin (ELOXATIN) 170 mg in dextrose 5 % 500 mL chemo infusion, 85 mg/m2,  Intravenous,  Once, 0 of 4 cycles  fluorouracil (ADRUCIL) 4,800 mg in sodium chloride 0.9 % 150 mL chemo infusion, 2,400 mg/m2 = 4,800 mg, Intravenous, 1 Day/Dose, 0 of 4 cycles  fluorouracil (ADRUCIL) chemo injection 800 mg, 400 mg/m2, Intravenous,  Once, 0 of 4 cycles  pegfilgrastim (NEULASTA) injection 6 mg, 6 mg, Subcutaneous,  Once, 2 of 2 cycles  ondansetron (ZOFRAN) IVPB 8 mg, 8 mg, Intravenous,  Once, 12 of 12 cycles  leucovorin 660 mg in dextrose 5 % 250 mL infusion, 660 mg (original dose ), Intravenous,  Once, 12 of 12 cycles Dose modification: 660 mg (Cycle 1)  oxaliplatin (ELOXATIN) 140 mg in dextrose 5 % 500 mL chemo infusion, 140 mg (original dose ), Intravenous,  Once, 12 of 12 cycles Dose modification: 140 mg (Cycle 1), 119 mg (85 % of original dose 140 mg, Cycle 8, Reason: Provider Judgment)  fluorouracil (ADRUCIL) 3,850 mg in sodium chloride 0.9 % 150 mL chemo infusion, 3,850 mg (original dose ), Intravenous, 1 Day/Dose, 12 of 12 cycles Dose modification: 3,850 mg (Cycle 1)  fluorouracil (ADRUCIL) chemo injection 700 mg, 700 mg (original dose ), Intravenous,  Once, 12 of 12 cycles Dose modification: 700 mg (Cycle 1)  palonosetron (ALOXI) injection 0.25 mg, 0.25 mg, Intravenous,  Once, 7 of 12 cycles Administration: 0.25 mg (09/30/2016)  bevacizumab (  AVASTIN) 475 mg in sodium chloride 0.9 % 100 mL chemo infusion, 5 mg/kg = 475 mg, Intravenous,  Once, 6 of 11 cycles Administration: 500 mg (09/30/2016)  leucovorin 880 mg in dextrose 5 % 250 mL infusion, 400 mg/m2 = 880 mg, Intravenous,  Once, 7 of 12 cycles Administration: 880 mg (09/30/2016)  oxaliplatin (ELOXATIN) 185 mg in dextrose 5 % 500 mL chemo infusion, 85 mg/m2 = 185 mg, Intravenous,  Once, 7 of 12 cycles Administration: 185 mg (09/30/2016)  fluorouracil (ADRUCIL) chemo injection 900 mg, 400 mg/m2 = 900 mg, Intravenous,  Once, 7 of 12 cycles Administration: 900 mg (09/30/2016)  fluorouracil (ADRUCIL) 5,300 mg  in sodium chloride 0.9 % 144 mL chemo infusion, 2,400 mg/m2 = 5,300 mg, Intravenous, 1 Day/Dose, 7 of 12 cycles Administration: 5,300 mg (09/30/2016)  for chemotherapy treatment.        08/27/2016 Imaging    CT CAP- 1. Pulmonary metastatic disease is stable. 2. Pancreatic head mass, grossly stable, with progressive atrophy of the body and tail of the pancreas. Stable peripancreatic lymph node. 3. Mild circumferential rectal wall thickening, stable. 4. Aortic atherosclerosis (ICD10-170.0). Coronary artery calcification. 5. Hyperattenuating lesion off the lower pole left kidney, stable, too small to characterize. Continued attention on followup exams is warranted. 6. Persistent wall thickening and mild dilatation of the proximal appendix, of uncertain etiology. Continued attention on followup exams is warranted.      11/06/2016 Imaging    CT C/A/P: IMPRESSION: 1. Stable pulmonary metastatic disease. 2. Similar appearing pancreatic head mass. Progressed atrophy of the pancreatic body and tail. Stable peripancreatic lymph node. 3. Stable hypoattenuating lesion partially exophytic off the inferior pole of the left kidney. 4. Aortic atherosclerosis. 5. Persistent mild wall thickening and mild dilatation of the appendix.        INTERVAL HISTORY: Charles [name] 66 y.o. male returns for followup of stage IV CRC.   He is accompanied today by his wife. He is here for cycle 12 of modified FOLFOX with Avastin. He continues to have diarrhea with soft stools that are  "like peanut butter". He had 6 small bowel movements today already. He states he has been having bad rectal pain from his frequent bowel movements. He has been given anucort suppositories as well as a 5 day course of cipro for his diarrhea by Dr. Woody Seller. He states that the neuropathy in his fingertips is worsening and he has occasional trouble picking up things due to numbness in his fingertips. He also states his fatigue is  worse. 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   Past Medical History:  Diagnosis Date  . Chronic anticoagulation   . Colon cancer ([place]) 07/24/2010   rectal ca, inv adenocarcinoma  . Depression 04/01/2011  . Diabetes mellitus without complication ([place])   . DVT (deep venous thrombosis) ([place]) 05/09/2011  . GERD (gastroesophageal reflux disease)   . High output ileostomy ([place]) 05/21/2011  . History of kidney stones   . HTN (hypertension)   . Hx of radiation therapy 09/02/10 to 10/14/10   pelvis  . Lung metastasis ([place])   . Neuropathy   . Peripheral vascular disease ([place])    dvt's,pe  . Pneumonia    hx x3  . Pulmonary embolism ([place])   . Rectal cancer ([place]) 01/15/2011   S/P radiation and concurrent 5-FU continuous infusion from 09/09/10- 10/10/10.  S/P proctectomy with colorectal anastomosis and diverting loop ileostomy on 11/14/10 at  [facility] by Dr. Harlon Ditty. Pathology reveals a pT3b N1 with 3/20 lymph nodes.      has GERD; Rectal cancer ([place]); Hx of radiation therapy; Stiffness of joint, not elsewhere classified, ankle and foot; Weakness of both legs; Peripheral neuropathy due to oxaliplatin-chemotherapy; Diarrhea; Acute pulmonary embolism ([place]); Lactic acidosis; Lower abdominal pain; Urinary retention; Lung nodule, solitary; DVT (deep venous thrombosis) ([place]); Lung nodule; S/P partial lobectomy of lung; GI bleed; Chronic anticoagulation; Constipation; Heme positive stool; Gastritis; S/P thoracotomy; History of rectal cancer; Hiatal hernia; Schatzki's ring; Rectal pain; Loss of weight; Abnormal pancreas function test; Community acquired pneumonia of right lower lobe of lung ([place]); and Colon cancer ([place]) on his problem list.     is allergic to oxycodone; tramadol; and trazodone and nefazodone.  Mr. Gervasi had no medications administered during this visit.  Past Surgical History:  Procedure Laterality Date  . COLON SURGERY  11/14/2010    proctectomy with colorectal anastomosis and diverting loop ileostomy (temporary planned)  . COLONOSCOPY  07/2010   proximal rectal apple core mass 10-14cm from anal verge (adenocarcinoma), 2-3cm distal rectal carpet polyp s/p piecemeal snare polypectomy (adenoma)  . COLONOSCOPY  04/21/2012   RMR: Friable,fibrotic appearing colorectal anastomosis producing some luminal narrowing-not felt to be critical. path: focal erosion with slight inflammation and hyperemia. SURVEILLANCE DUE DEC 2015  . COLONOSCOPY N/A 11/24/2013   Dr. Rourk:somewhat fibrotic/friable anastomotic mucosal-status post biopsy (narrowing not felt to be clinically significant) Single colonic diverticulum. benign polypoid rectal mucosa  . colostomy reversal  april 2013  . ESOPHAGOGASTRODUODENOSCOPY  07/2010   RMR: schatki ring s/p dilation, small hh, SB bx benign  . ESOPHAGOGASTRODUODENOSCOPY N/A 09/04/2015   Procedure: ESOPHAGOGASTRODUODENOSCOPY (EGD);  Surgeon: Daneil Dolin, MD;  Location: [facility];  Service: Endoscopy;  Laterality: N/A;  730  . EUS  08/2010   Dr. Owens Loffler. uT3N0 circumferential, nearly obstruction rectosigmoid adenocarcinoma, distal edge 12cm from anal verge  . EUS N/A 06/19/2016   Procedure: UPPER ENDOSCOPIC ULTRASOUND (EUS) RADIAL;  Surgeon: Milus Banister, MD;  Location: [facility] ENDOS[facility];  Service: Endoscopy;  Laterality: N/A;  . HERNIA REPAIR     abd hernia repair  . IVC filter    . ivc filter    . port a cath placement    . PORT-A-CATH REMOVAL  09/24/2011   Procedure: REMOVAL PORT-A-CATH;  Surgeon: Donato Heinz, MD;  Location: [facility] ORS;  Service: General;  Laterality: N/A;  Minor Room  . PORTACATH PLACEMENT Right 07/07/2016   Procedure: INSERTION PORT-A-CATH;  Surgeon: Aviva Signs, MD;  Location: [facility] ORS;  Service: General;  Laterality: Right;  Marland Kitchen VIDEO ASSISTED THORACOSCOPY (VATS)/WEDGE RESECTION Right 11/15/2014   Procedure: VIDEO ASSISTED THORACOSCOPY (VATS)/LUNG RESECTION WITH RIGHT LINGULECTOMY;   Surgeon: [name], MD;  Location: [place];  Service: Thoracic;  Laterality: Right;  Marland Kitchen VIDEO BRONCHOSCOPY N/A 11/15/2014   Procedure: VIDEO BRONCHOSCOPY;  Surgeon: [name], MD;  Location: [facility] OR;  Service: Thoracic;  Laterality: N/A;   Review of Systems  Constitutional: Positive for malaise/fatigue.  HENT: Negative.   Eyes: Negative.   Respiratory: Negative.   Cardiovascular: Negative.   Gastrointestinal: Positive for diarrhea. Negative for abdominal pain.       Rectal pain  Genitourinary: Negative.   Musculoskeletal: Negative.   Skin: Negative.   Neurological: Negative.   Endo/Heme/Allergies: Negative.   Psychiatric/Behavioral: The patient does not have insomnia.   All other systems reviewed and are negative. 14 point review of systems was performed and  is negative except as detailed under history of present illness and above  PHYSICAL EXAMINATION  ECOG PERFORMANCE STATUS: 2 - Symptomatic, <50% confined to bed   Physical Exam  Constitutional: He is oriented to person, place, and time and well-developed, well-nourished, and in no distress.  HENT:  Head: Normocephalic and atraumatic.  Nose: Nose normal.  Mouth/Throat: Oropharynx is clear and moist. No oropharyngeal exudate.  Eyes: Pupils are equal, round, and reactive to light. Conjunctivae and EOM are normal. Right eye exhibits no discharge. Left eye exhibits no discharge. No scleral icterus.  Neck: Normal range of motion. Neck supple. No tracheal deviation present. No thyromegaly present.  Cardiovascular: Normal rate, regular rhythm and normal heart sounds.  Exam reveals no gallop and no friction rub.   No murmur heard. Pulmonary/Chest: Effort normal and breath sounds normal. He has no wheezes. He has no rales.  Abdominal: Soft. Bowel sounds are normal. He exhibits no distension and no mass. There is no tenderness. There is no rebound and no guarding.  Musculoskeletal: Normal range of motion. He exhibits no edema.   Lymphadenopathy:    He has no cervical adenopathy.  Neurological: He is alert and oriented to person, place, and time. He has normal reflexes. No cranial nerve deficit. Gait normal. Coordination normal.  Skin: Skin is warm and dry. No rash noted.  Psychiatric: Mood, memory, affect and judgment normal.  Nursing note and vitals reviewed.  LABORATORY DATA: I have reviewed the data as listed.  CBC Latest Ref Rng & Units 12/09/2016 11/25/2016 11/11/2016  WBC 4.0 - 10.5 K/uL 4.5 4.5 4.1  Hemoglobin 13.0 - 17.0 g/dL 12.8(L) 12.8(L) 12.5(L)  Hematocrit 39.0 - 52.0 % 38.7(L) 38.4(L) 38.3(L)  Platelets 150 - 400 K/uL 128(L) 114(L) 106(L)   CMP Latest Ref Rng & Units 11/25/2016 11/11/2016 10/28/2016  Glucose 65 - 99 mg/dL 204(H) 115(H) 181(H)  BUN 6 - 20 mg/dL _0 Creatinine 0.61 - 1.24 mg/dL 0.79 0.62 0.63  Sodium 135 - 145 mmol/L 140 140 139  Potassium 3.5 - 5.1 mmol/L 3.8 3.4(L) 3.3(L)  Chloride 101 - 111 mmol/L 108 109 108  CO2 22 - 32 mmol/L _1 Calcium 8.9 - 10.3 mg/dL 8.4(L) 8.5(L) 8.1(L)  Total Protein 6.5 - 8.1 g/dL 5.8(L) 5.8(L) 5.8(L)  Total Bilirubin 0.3 - 1.2 mg/dL 1.4(H) 1.0 1.0  Alkaline Phos 38 - 126 U/L 64 68 70  AST 15 - 41 U/L _2 ALT 17 - 63 U/L _3 PATHOLOGY:   RADIOLOGY: I have personally reviewed the radiological images as listed and agreed with the findings in the report.  Initial PET 06/25/2016 IMPRESSION: 1. Two pleural-based nodules in the right hemithorax are hypermetabolic. Metastatic disease is a distinct consideration. The scattered pulmonary parenchymal nodule seen on previous diagnostic CT imaging are below the threshold for reliable resolution on PET imaging. 2. Hypermetabolic lesion pancreatic head, consistent with neoplasm. As noted on prior CT, adenocarcinoma is any consideration. No evidence for hypermetabolic abdominal lymphadenopathy. Mottled uptake noted in the liver, but no discrete hepatic metastases are evident on PET  imaging. 3. Appendix remains distended up to 09-10 mm diameter with a stone towards the tip in subtle periappendiceal edema/inflammation. The appendix is hypermetabolic along its length. Imaging features are relatively stable in the 12 day interval since prior CT scan. While appendicitis is a consideration, the relative stability over 12 days would be unusual for that etiology. Continued close follow-up recommended.  CT Head  wo Contrast 06/17/2016 IMPRESSION: Normal exam except for atherosclerotic calcification of the major vessels at the base of the brain.   CT CHEST, ABDOMEN, AND PELVIS WITH CONTRAST 06/13/2016 IMPRESSION: 1. New right-sided pleural metastasis/mass. Other smaller right-sided pulmonary nodules which are all pleural-based and most likely represent pleural metastasis. 2. No convincing evidence of abdominopelvic nodal metastasis. 3. Constellation of findings, including pancreatic atrophy, duct dilatation, and pancreatic head soft tissue fullness which are highly suspicious for pancreatic adenocarcinoma. Metastatic disease felt much less likely. Consider endoscopic ultrasound sampling or ERCP. Cannot exclude superimposed acute pancreatitis. 4. New enlargement of the appendiceal tip with subtle surrounding edema. Cannot exclude early or mild appendicitis. 5.  Coronary artery atherosclerosis. Aortic atherosclerosis. 6. 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  CT HEAD WITHOUT CONTRAST 06/17/2016  IMPRESSION: Normal exam except for atherosclerotic calcification of the major vessels at the base of the brain.  NUCLEAR MEDICINE PET SKULL BASE TO THIGH 06/25/2016  IMPRESSION: 1. Two pleural-based nodules in the right hemithorax are hypermetabolic. Metastatic disease is a distinct consideration. The scattered pulmonary parenchymal nodule seen on previous diagnostic CT imaging are below the threshold for reliable resolution on PET imaging. 2.  Hypermetabolic lesion pancreatic head, consistent with neoplasm. As noted on prior CT, adenocarcinoma is any consideration. No evidence for hypermetabolic abdominal lymphadenopathy. Mottled uptake noted in the liver, but no discrete hepatic metastases are evident on PET imaging. 3. Appendix remains distended up to 09-10 mm diameter with a stone towards the tip in subtle periappendiceal edema/inflammation. The appendix is hypermetabolic along its length. Imaging features are relatively stable in the 12 day interval since prior CT scan. While appendicitis is a consideration, the relative stability over 12 days would be unusual for that etiology. Continued close follow-up recommended.  PORTABLE CHEST 1 VIEW 07/07/2016  IMPRESSION: Changes of known metastatic disease as well as new port placement. No pneumothorax is noted.  PORTABLE CHEST 1 VIEW 07/23/2016  IMPRESSION: Mild right basilar airspace opacity raises concern for pneumonia.  CT CHEST, ABDOMEN, AND PELVIS WITH CONTRAST 08/27/2016  IMPRESSION: 1. Pulmonary metastatic disease is stable. 2. Pancreatic head mass, grossly stable, with progressive atrophy of the body and tail of the pancreas. Stable peripancreatic lymph node. 3. Mild circumferential rectal wall thickening, stable. 4. Aortic atherosclerosis (ICD10-170.0). Coronary artery calcification. 5. Hyperattenuating lesion off the lower pole left kidney, stable, too small to characterize. Continued attention on followup exams is warranted. 6. Persistent wall thickening and mild dilatation of the proximal appendix, of uncertain etiology. Continued attention on followup exams is warranted.   ASSESSMENT AND PLAN:  Stage IV CRC s/p surgical resection of solitary pulmonary nodule on 11/16/2014 Bronchoscopy, R VATS, wedge resection RML 11/16/2014 with Dr. Servando Snare History of Stage III CRC Treatment related neuropathy History of IVC filter in 2012 Acute DVT/PE 09/2014, s/p  catheter lysis of DVT XARELTO Anxiety on Lexapro GERD  Continue chemotherapy with FOLFOX and avastin q2weeks, however I will dose reduce both his 5FU and oxaliplatin by 25% given his worsening symptoms of diarrhea, fatigue, neuropathy. Proceed with cycle 12today. Will give him a 500 ml bolus of NS to help with hydration. Advised him to make sure he drinks 6-8 glasses of fluids daily.  Plan to repeat restaging scans in October, which will be 3 months from his last scans, which showed stable disease.  RTC in 4 weeks for follow up.    All questions were answered. The patient knows to call the clinic with any problems, questions  or concerns. We can certainly see the patient much sooner if necessary.    This note was electronically signed.   Twana First, MD

## 2016-12-09 NOTE — Progress Notes (Signed)
Labs reviewed with MD, will dose reduce chemotherapy today per orders. Patient is aware. Proceed with treatment.  518mls bolus given of IVF's per md. Chemotherapy given as ordered. Patient tolerated it well without problems. Continuous 5FU pump administered per orders. Vitals stable and discharged home from clinic ambulatory. Follow up as scheduled.

## 2016-12-11 ENCOUNTER — Encounter (HOSPITAL_COMMUNITY): Payer: Self-pay

## 2016-12-11 ENCOUNTER — Other Ambulatory Visit (HOSPITAL_COMMUNITY): Payer: Self-pay | Admitting: Oncology

## 2016-12-11 ENCOUNTER — Encounter (HOSPITAL_BASED_OUTPATIENT_CLINIC_OR_DEPARTMENT_OTHER): Payer: Medicare Other

## 2016-12-11 VITALS — BP 149/79 | HR 82 | Temp 98.2°F | Resp 18

## 2016-12-11 DIAGNOSIS — C20 Malignant neoplasm of rectum: Secondary | ICD-10-CM

## 2016-12-11 MED ORDER — HEPARIN SOD (PORK) LOCK FLUSH 100 UNIT/ML IV SOLN
500.0000 [IU] | Freq: Once | INTRAVENOUS | Status: AC | PRN
  Administered 2016-12-11: 500 [IU]

## 2016-12-11 MED ORDER — SODIUM CHLORIDE 0.9% FLUSH
10.0000 mL | INTRAVENOUS | Status: DC | PRN
  Administered 2016-12-11: 10 mL
  Filled 2016-12-11: qty 10

## 2016-12-11 NOTE — Progress Notes (Signed)
[name] returns today for port de access and flush after 46 hr continous infusion of 43fu. Tolerated infusion without problems. Portacath located right chest wall was  deaccessed and flushed with 76ml NS and 500U/33ml Heparin and needle removed intact.  Procedure without incident. Patient tolerated procedure well.  Vitals stable and discharged home from clinic ambulatory. Follow up as scheduled.Patient also stated that he is feeling much better and his rectal pain and bowels are doing much better.

## 2016-12-11 NOTE — Patient Instructions (Signed)
[place] at [facility] Discharge Instructions  RECOMMENDATIONS MADE BY THE CONSULTANT AND ANY TEST RESULTS WILL BE SENT TO YOUR REFERRING PHYSICIAN.  Continuous 5 FU pump disconnected today  Follow up a scheduled  Thank you for choosing [place] at [facility] to provide your oncology and hematology care.  To afford each patient quality time with our provider, please arrive at least 15 minutes before your scheduled appointment time.    If you have a lab appointment with the [place] please come in thru the  Main Entrance and check in at the main information desk  You need to re-schedule your appointment should you arrive 10 or more minutes late.  We strive to give you quality time with our providers, and arriving late affects you and other patients whose appointments are after yours.  Also, if you no show three or more times for appointments you may be dismissed from the clinic at the providers discretion.     Again, thank you for choosing [facility].  Our hope is that these requests will decrease the amount of time that you wait before being seen by our physicians.       _____________________________________________________________  Should you have questions after your visit to [facility], please contact our office at [phone] between the hours of 8:30 a.m. and 4:30 p.m.  Voicemails left after 4:30 p.m. will not be returned until the following business day.  For prescription refill requests, have your pharmacy contact our office.       Resources For Cancer Patients and their Caregivers ? American Cancer Society: Can assist with transportation, wigs, general needs, runs Look Good Feel Better.        812-259-3022 ? Cancer Care: Provides financial assistance, online support groups, medication/co-pay assistance.  1-800-813-HOPE 660-544-0056) ? [place] Assists [place]  cancer patients and their families through emotional , educational and financial support.  (386) 569-9976 ? [place] DSS Where to apply for food stamps, Medicaid and utility assistance. 978-129-9795 ? [employer]: Transportation to medical appointments. 604 200 1640 ? Social Security Administration: May apply for disability if have a Stage IV cancer. 620-745-8313 3394313304 ? [employer], Disability and Transit Services: Assists with nutrition, care and transit needs. [place] Support Programs: @10RELATIVEDAYS @ > Cancer Support Group  2nd Tuesday of the month 1pm-2pm, Journey Room  > Creative Journey  3rd Tuesday of the month 1130am-1pm, Journey Room  > Look Good Feel Better  1st Wednesday of the month 10am-12 noon, Journey Room (Call [place] to register [phone])

## 2016-12-23 ENCOUNTER — Encounter (HOSPITAL_COMMUNITY): Payer: Self-pay

## 2016-12-23 ENCOUNTER — Encounter (HOSPITAL_COMMUNITY): Payer: Medicare Other | Attending: Oncology

## 2016-12-23 VITALS — BP 184/77 | HR 76 | Temp 97.4°F | Resp 18 | Wt 205.6 lb

## 2016-12-23 DIAGNOSIS — Z5112 Encounter for antineoplastic immunotherapy: Secondary | ICD-10-CM

## 2016-12-23 DIAGNOSIS — C20 Malignant neoplasm of rectum: Secondary | ICD-10-CM | POA: Insufficient documentation

## 2016-12-23 DIAGNOSIS — Z5111 Encounter for antineoplastic chemotherapy: Secondary | ICD-10-CM

## 2016-12-23 DIAGNOSIS — C7801 Secondary malignant neoplasm of right lung: Secondary | ICD-10-CM

## 2016-12-23 DIAGNOSIS — E876 Hypokalemia: Secondary | ICD-10-CM

## 2016-12-23 LAB — COMPREHENSIVE METABOLIC PANEL
ALT: 18 U/L (ref 17–63)
AST: 23 U/L (ref 15–41)
Albumin: 3.2 g/dL — ABNORMAL LOW (ref 3.5–5.0)
Alkaline Phosphatase: 67 U/L (ref 38–126)
Anion gap: 6 (ref 5–15)
BUN: 8 mg/dL (ref 6–20)
CO2: 25 mmol/L (ref 22–32)
Calcium: 8.3 mg/dL — ABNORMAL LOW (ref 8.9–10.3)
Chloride: 108 mmol/L (ref 101–111)
Creatinine, Ser: 0.66 mg/dL (ref 0.61–1.24)
GFR calc Af Amer: >60 mL/min (ref >60)
GFR calc non Af Amer: >60 mL/min (ref >60)
Glucose, Bld: 175 mg/dL — ABNORMAL HIGH (ref 65–99)
Potassium: 3.2 mmol/L — ABNORMAL LOW (ref 3.5–5.1)
Sodium: 139 mmol/L (ref 135–145)
Total Bilirubin: 1.1 mg/dL (ref 0.3–1.2)
Total Protein: 5.8 g/dL — ABNORMAL LOW (ref 6.5–8.1)

## 2016-12-23 LAB — CBC WITH DIFFERENTIAL/PLATELET
Basophils Absolute: 0 10*3/uL (ref 0.0–0.1)
Basophils Relative: 1 %
Eosinophils Absolute: 0.1 10*3/uL (ref 0.0–0.7)
Eosinophils Relative: 3 %
HCT: 38.9 % — ABNORMAL LOW (ref 39.0–52.0)
Hemoglobin: 12.7 g/dL — ABNORMAL LOW (ref 13.0–17.0)
Lymphocytes Relative: 35 %
Lymphs Abs: 1.4 10*3/uL (ref 0.7–4.0)
MCH: 29.5 pg (ref 26.0–34.0)
MCHC: 32.6 g/dL (ref 30.0–36.0)
MCV: 90.5 fL (ref 78.0–100.0)
Monocytes Absolute: 0.4 10*3/uL (ref 0.1–1.0)
Monocytes Relative: 10 %
Neutro Abs: 2.2 10*3/uL (ref 1.7–7.7)
Neutrophils Relative %: 51 %
Platelets: 121 10*3/uL — ABNORMAL LOW (ref 150–400)
RBC: 4.3 MIL/uL (ref 4.22–5.81)
RDW: 16.1 % — ABNORMAL HIGH (ref 11.5–15.5)
WBC: 4.2 10*3/uL (ref 4.0–10.5)

## 2016-12-23 LAB — URINALYSIS, DIPSTICK ONLY
Bilirubin Urine: NEGATIVE
Glucose, UA: NEGATIVE mg/dL
Hgb urine dipstick: NEGATIVE
Ketones, ur: NEGATIVE mg/dL
Leukocytes, UA: NEGATIVE
Nitrite: NEGATIVE
Protein, ur: NEGATIVE mg/dL
Specific Gravity, Urine: 1.025 (ref 1.005–1.030)
pH: 6 (ref 5.0–8.0)

## 2016-12-23 MED ORDER — SODIUM CHLORIDE 0.9% FLUSH
10.0000 mL | INTRAVENOUS | Status: DC | PRN
  Administered 2016-12-23: 10 mL
  Filled 2016-12-23: qty 10

## 2016-12-23 MED ORDER — POTASSIUM CHLORIDE CRYS ER 20 MEQ PO TBCR
60.0000 meq | EXTENDED_RELEASE_TABLET | Freq: Two times a day (BID) | ORAL | Status: DC
Start: 2016-12-23 — End: 2016-12-23
  Administered 2016-12-23: 60 meq via ORAL
  Filled 2016-12-23: qty 3

## 2016-12-23 MED ORDER — SODIUM CHLORIDE 0.9 % IV SOLN
1800.0000 mg/m2 | INTRAVENOUS | Status: DC
  Administered 2016-12-23: 3950 mg via INTRAVENOUS
  Filled 2016-12-23: qty 79

## 2016-12-23 MED ORDER — LEUCOVORIN CALCIUM INJECTION 350 MG
295.0000 mg/m2 | Freq: Once | INTRAVENOUS | Status: AC
  Administered 2016-12-23: 650 mg via INTRAVENOUS
  Filled 2016-12-23: qty 32.5

## 2016-12-23 MED ORDER — HEPARIN SOD (PORK) LOCK FLUSH 100 UNIT/ML IV SOLN: 500.0000 [IU] | Freq: Once | INTRAVENOUS | Status: DC | PRN

## 2016-12-23 MED ORDER — DEXAMETHASONE SODIUM PHOSPHATE 10 MG/ML IJ SOLN
10.0000 mg | Freq: Once | INTRAMUSCULAR | Status: AC
  Administered 2016-12-23: 10 mg via INTRAVENOUS
  Filled 2016-12-23: qty 1

## 2016-12-23 MED ORDER — DEXTROSE 5 % IV SOLN
Freq: Once | INTRAVENOUS | Status: AC
  Administered 2016-12-23: 11:00:00 via INTRAVENOUS

## 2016-12-23 MED ORDER — DIPHENHYDRAMINE HCL 50 MG/ML IJ SOLN
50.0000 mg | Freq: Once | INTRAMUSCULAR | Status: AC
  Administered 2016-12-23: 50 mg via INTRAVENOUS
  Filled 2016-12-23: qty 1

## 2016-12-23 MED ORDER — SODIUM CHLORIDE 0.9 % IV SOLN
5.4000 mg/kg | Freq: Once | INTRAVENOUS | Status: AC
  Administered 2016-12-23: 500 mg via INTRAVENOUS
  Filled 2016-12-23: qty 4

## 2016-12-23 MED ORDER — PALONOSETRON HCL INJECTION 0.25 MG/5ML
0.2500 mg | Freq: Once | INTRAVENOUS | Status: AC
  Administered 2016-12-23: 0.25 mg via INTRAVENOUS
  Filled 2016-12-23: qty 5

## 2016-12-23 MED ORDER — SODIUM CHLORIDE 0.9 % IV SOLN
INTRAVENOUS | Status: DC
  Administered 2016-12-23: 500 mL via INTRAVENOUS

## 2016-12-23 MED ORDER — FLUOROURACIL CHEMO INJECTION 2.5 GM/50ML
300.0000 mg/m2 | Freq: Once | INTRAVENOUS | Status: AC
  Administered 2016-12-23: 650 mg via INTRAVENOUS
  Filled 2016-12-23: qty 13

## 2016-12-23 MED ORDER — OXALIPLATIN CHEMO INJECTION 100 MG/20ML
65.0000 mg/m2 | Freq: Once | INTRAVENOUS | Status: AC
  Administered 2016-12-23: 145 mg via INTRAVENOUS
  Filled 2016-12-23: qty 9

## 2016-12-23 NOTE — Progress Notes (Signed)
Reviewed labs with oncologist.  Orders received to give the patient Potassium 60 meq PO and ok to treat today.    Patient tolerated Avastin and FOLFOX today with no complaints voiced.  Reminded of cold sensitivities with understanding verbalized.  Port site clean and dry with no bruising or swelling noted at site.  Dressing intact.  VSS and ambulated with wife for discharge.  5FU pump running with discharge and no alarms sounding.

## 2016-12-23 NOTE — Patient Instructions (Signed)
[place] Discharge Instructions for Patients Receiving Chemotherapy  Today you received the following chemotherapy agents Avastin, Oxaliplatin, Leucovorin, and 5FU.  Keep scheduled appointments and call for any concerns.    To help prevent nausea and vomiting after your treatment, we encourage you to take your nausea medication.   If you develop nausea and vomiting that is not controlled by your nausea medication, call the clinic.   BELOW ARE SYMPTOMS THAT SHOULD BE REPORTED IMMEDIATELY:  *FEVER GREATER THAN 100.5 F  *CHILLS WITH OR WITHOUT FEVER  NAUSEA AND VOMITING THAT IS NOT CONTROLLED WITH YOUR NAUSEA MEDICATION  *UNUSUAL SHORTNESS OF BREATH  *UNUSUAL BRUISING OR BLEEDING  TENDERNESS IN MOUTH AND THROAT WITH OR WITHOUT PRESENCE OF ULCERS  *URINARY PROBLEMS  *BOWEL PROBLEMS  UNUSUAL RASH Items with * indicate a potential emergency and should be followed up as soon as possible.  Feel free to call the clinic you have any questions or concerns. The clinic phone number is [phone].  Please show the [place] at check-in to the Emergency Department and triage nurse.

## 2016-12-24 LAB — CEA: CEA: 2.6 ng/mL (ref 0.0–4.7)

## 2016-12-25 ENCOUNTER — Encounter (HOSPITAL_BASED_OUTPATIENT_CLINIC_OR_DEPARTMENT_OTHER): Payer: Medicare Other

## 2016-12-25 ENCOUNTER — Encounter (HOSPITAL_COMMUNITY): Payer: Self-pay

## 2016-12-25 VITALS — BP 155/95 | HR 86 | Temp 97.9°F | Resp 18

## 2016-12-25 DIAGNOSIS — C20 Malignant neoplasm of rectum: Secondary | ICD-10-CM

## 2016-12-25 MED ORDER — SODIUM CHLORIDE 0.9% FLUSH
10.0000 mL | INTRAVENOUS | Status: DC | PRN
  Administered 2016-12-25: 10 mL
  Filled 2016-12-25: qty 10

## 2016-12-25 MED ORDER — HEPARIN SOD (PORK) LOCK FLUSH 100 UNIT/ML IV SOLN
500.0000 [IU] | Freq: Once | INTRAVENOUS | Status: AC | PRN
  Administered 2016-12-25: 500 [IU]

## 2016-12-25 NOTE — Patient Instructions (Signed)
[place] at [facility] Discharge Instructions  RECOMMENDATIONS MADE BY THE CONSULTANT AND ANY TEST RESULTS WILL BE SENT TO YOUR REFERRING PHYSICIAN.  You had your pump discontinued today. Follow up in 2 weeks for next treatment.  Thank you for choosing [place] at [facility] to provide your oncology and hematology care.  To afford each patient quality time with our provider, please arrive at least 15 minutes before your scheduled appointment time.    If you have a lab appointment with the [place] please come in thru the  Main Entrance and check in at the main information desk  You need to re-schedule your appointment should you arrive 10 or more minutes late.  We strive to give you quality time with our providers, and arriving late affects you and other patients whose appointments are after yours.  Also, if you no show three or more times for appointments you may be dismissed from the clinic at the providers discretion.     Again, thank you for choosing [facility].  Our hope is that these requests will decrease the amount of time that you wait before being seen by our physicians.       _____________________________________________________________  Should you have questions after your visit to [facility], please contact our office at [phone] between the hours of 8:30 a.m. and 4:30 p.m.  Voicemails left after 4:30 p.m. will not be returned until the following business day.  For prescription refill requests, have your pharmacy contact our office.       Resources For Cancer Patients and their Caregivers ? American Cancer Society: Can assist with transportation, wigs, general needs, runs Look Good Feel Better.        (939) 834-7676 ? Cancer Care: Provides financial assistance, online support groups, medication/co-pay assistance.  1-800-813-HOPE 934-077-1933) ? [place] Assists  [place] cancer patients and their families through emotional , educational and financial support.  937-128-6057 ? [place] DSS Where to apply for food stamps, Medicaid and utility assistance. 706-187-4804 ? [employer]: Transportation to medical appointments. 971-004-8346 ? Social Security Administration: May apply for disability if have a Stage IV cancer. 859 075 7826 712 776 6254 ? [employer], Disability and Transit Services: Assists with nutrition, care and transit needs. [place] Support Programs: @10RELATIVEDAYS @ > Cancer Support Group  2nd Tuesday of the month 1pm-2pm, Journey Room  > Creative Journey  3rd Tuesday of the month 1130am-1pm, Journey Room  > Look Good Feel Better  1st Wednesday of the month 10am-12 noon, Journey Room (Call [place] to register [phone])

## 2016-12-25 NOTE — Progress Notes (Signed)
Patient arrived with home infusion pump stopped. Patient denies any issues with pump over the past 2 days. Pump disconnected from patient and patient's port flushed with normal saline 89ml and Heparin 500 units. Needle was removed intact. Patient tolerated procedure well. Patient discharged ambulatory and in stable condition from clinic with spouse. Patient to follow up as scheduled.

## 2016-12-30 DIAGNOSIS — C799 Secondary malignant neoplasm of unspecified site: Secondary | ICD-10-CM | POA: Diagnosis not present

## 2016-12-30 DIAGNOSIS — I82409 Acute embolism and thrombosis of unspecified deep veins of unspecified lower extremity: Secondary | ICD-10-CM | POA: Diagnosis not present

## 2016-12-30 DIAGNOSIS — Z6824 Body mass index (BMI) 24.0-24.9, adult: Secondary | ICD-10-CM | POA: Diagnosis not present

## 2016-12-30 DIAGNOSIS — I10 Essential (primary) hypertension: Secondary | ICD-10-CM | POA: Diagnosis not present

## 2016-12-30 DIAGNOSIS — I2699 Other pulmonary embolism without acute cor pulmonale: Secondary | ICD-10-CM | POA: Diagnosis not present

## 2016-12-30 DIAGNOSIS — F419 Anxiety disorder, unspecified: Secondary | ICD-10-CM | POA: Diagnosis not present

## 2016-12-30 DIAGNOSIS — K589 Irritable bowel syndrome without diarrhea: Secondary | ICD-10-CM | POA: Diagnosis not present

## 2016-12-30 DIAGNOSIS — Z299 Encounter for prophylactic measures, unspecified: Secondary | ICD-10-CM | POA: Diagnosis not present

## 2016-12-30 DIAGNOSIS — C20 Malignant neoplasm of rectum: Secondary | ICD-10-CM | POA: Diagnosis not present

## 2016-12-30 DIAGNOSIS — E1165 Type 2 diabetes mellitus with hyperglycemia: Secondary | ICD-10-CM | POA: Diagnosis not present

## 2016-12-30 DIAGNOSIS — C7800 Secondary malignant neoplasm of unspecified lung: Secondary | ICD-10-CM | POA: Diagnosis not present

## 2016-12-30 DIAGNOSIS — C189 Malignant neoplasm of colon, unspecified: Secondary | ICD-10-CM | POA: Diagnosis not present

## 2017-01-05 NOTE — Progress Notes (Signed)
Charles [name], [place] [id number]   CLINIC:  Medical Oncology/Hematology  PCP:  [name], MD [place] [id number] [phone]   REASON FOR VISIT:  Follow-up for Stage IVA adenocarcinoma of rectum with lung and pancreatic mets  CURRENT THERAPY: FOLFOX/Avastin, beginning 07/08/16   BRIEF ONCOLOGIC HISTORY:    Rectal cancer ([place])   07/24/2010 Initial Diagnosis    Invasive adenocarcinoma of rectum      09/09/2010 Concurrent Chemotherapy    S/P radiation and concurrent 5-FU continuous infusion from 09/09/10- 10/10/10.      11/14/2010 Surgery    S/P proctectomy with colorectal anastomosis and diverting loop ileostomy on 11/14/10 at [facility] by Dr. Harlon Ditty. Pathology reveals a pT3b N1 with 3/20 lymph nodes.      02/05/2011 - 07/14/2011 Chemotherapy    FOLFOX      08/18/2011 Surgery    Approximate date of surgery- [place] by Dr. Harlon Ditty       Remission         11/24/2013 Survivorship    Colonoscopy- somewhat fibrotic/friable anastomotic mucosal-status post biopsy (narrowing not felt to be clinically significant).  Negative pathology for malignancy      09/12/2014 Imaging    DVT in the left femoral venous system, left common iliac vein, IVC, and within the IVC filter Right lower extremity venography confirms chronic occlusion of the femoral venous system with collateralization. The right iliac venous system is patent and do      09/25/2014 PET scan    The right middle lobe pulmonary nodule is hypermetabolic, favored to represent a primary bronchogenic carcinoma.Equivocal mediastinal nodes, similar to surrounding blood pool. Bilateral adrenal hypermetabolism, felt to be physiologic      10/24/2014 Pathology Results    Lung, needle/core biopsy(ies), RML - ADENOCARCINOMA, SEE COMMENT metastatic adenocarcinoma of a colorectal primary      10/24/2014 Relapse/Recurrence         11/16/2014 Definitive Surgery    Bronchoscopy,  right video-assisted thoracoscopy, wedge resection of right middle lobe by Dr. Servando Snare      11/16/2014 Pathology Results    Lung, wedge biopsy/resection, right lingula and small portion of middle lobe - METASTATIC ADENOCARCINOMA, CONSISTENT WITH COLORECTAL PRIMARY, SPANNING 2.0 CM. - THE SURGICAL RESECTION MARGINS ARE NEGATIVE FOR ADENOCARCINOMA.      11/16/2014 Remission    THE SURGICAL RESECTION MARGINS ARE NEGATIVE FOR ADENOCARCINOMA.      03/07/2015 Imaging    CT CAP- Interval resection of right middle lobe metastasis. No acute process or evidence of metastatic disease in the chest, abdomen or pelvis. Improved right upper lobe reticular nodular opacities are favored to represent resolving infection.      09/14/2015 Imaging    CT CAP- No findings of recurrent malignancy. No recurrence along the wedge resection site of the right middle lobe. Right anterior abdominal wall focal hernia containing a knuckle of small bowel without complicating feature.      06/13/2016 Imaging    CT CAP- 1. New right-sided pleural metastasis/mass. Other smaller right-sided pulmonary nodules which are all pleural-based and most likely represent pleural metastasis. 2. No convincing evidence of abdominopelvic nodal metastasis. 3. Constellation of findings, including pancreatic atrophy, duct dilatation, and pancreatic head soft tissue fullness which are highly suspicious for pancreatic adenocarcinoma. Metastatic disease felt much less likely. Consider endoscopic ultrasound sampling or ERCP. Cannot exclude superimposed acute pancreatitis. 4. New enlargement of the appendiceal tip with subtle surrounding edema. Cannot  exclude early or mild appendicitis. 5. Coronary artery atherosclerosis. Aortic atherosclerosis. 6. 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      06/13/2016 Progression    CT scan demonstrates progression of disease      06/19/2016 Procedure    EUS with FNA by Dr.  Ardis Hughs      06/20/2016 Pathology Results    FINE NEEDLE ASPIRATION, ENDOSCOPIC, PANCREAS UNCINATE AREA(SPECIMEN 1 OF 1 COLLECTED 06/19/16): MALIGNANT CELLS CONSISTENT WITH METASTATIC ADENOCARCINOMA.      06/25/2016 PET scan    1. Two pleural-based nodules in the right hemithorax are hypermetabolic. Metastatic disease is a distinct consideration. The scattered pulmonary parenchymal nodule seen on previous diagnostic CT imaging are below the threshold for reliable resolution on PET imaging. 2. Hypermetabolic lesion pancreatic head, consistent with neoplasm. As noted on prior CT, adenocarcinoma is any consideration. No evidence for hypermetabolic abdominal lymphadenopathy. Mottled uptake noted in the liver, but no discrete hepatic metastases are evident on PET imaging. 3. Appendix remains distended up to 0.9-10 mm diameter with a stone towards the tip in subtle periappendiceal edema/inflammation. The appendix is hypermetabolic along its length. Imaging features are relatively stable in the 12 day interval since prior CT scan. While appendicitis is a consideration, the relative stability over 12 days would be unusual for that etiology. Continued close follow-up recommended.      06/26/2016 Pathology Results    Not enough tissue for foundationONE or K-ras testing.      07/07/2016 Procedure    Port placed by Dr. Arnoldo Morale      07/08/2016 -  Chemotherapy    The patient had pegfilgrastim (NEULASTA) injection 6 mg, 6 mg, Subcutaneous,  Once, 0 of 4 cycles  ondansetron (ZOFRAN) IVPB 8 mg, 8 mg, Intravenous,  Once, 0 of 4 cycles  leucovorin 804 mg in dextrose 5 % 250 mL infusion, 400 mg/m2, Intravenous,  Once, 0 of 4 cycles  oxaliplatin (ELOXATIN) 170 mg in dextrose 5 % 500 mL chemo infusion, 85 mg/m2, Intravenous,  Once, 0 of 4 cycles  fluorouracil (ADRUCIL) 4,800 mg in sodium chloride 0.9 % 150 mL chemo infusion, 2,400 mg/m2 = 4,800 mg, Intravenous, 1 Day/Dose, 0 of 4 cycles  fluorouracil  (ADRUCIL) chemo injection 800 mg, 400 mg/m2, Intravenous,  Once, 0 of 4 cycles  pegfilgrastim (NEULASTA) injection 6 mg, 6 mg, Subcutaneous,  Once, 2 of 2 cycles  ondansetron (ZOFRAN) IVPB 8 mg, 8 mg, Intravenous,  Once, 12 of 12 cycles  leucovorin 660 mg in dextrose 5 % 250 mL infusion, 660 mg (original dose ), Intravenous,  Once, 12 of 12 cycles Dose modification: 660 mg (Cycle 1)  oxaliplatin (ELOXATIN) 140 mg in dextrose 5 % 500 mL chemo infusion, 140 mg (original dose ), Intravenous,  Once, 12 of 12 cycles Dose modification: 140 mg (Cycle 1), 119 mg (85 % of original dose 140 mg, Cycle 8, Reason: Provider Judgment)  fluorouracil (ADRUCIL) 3,850 mg in sodium chloride 0.9 % 150 mL chemo infusion, 3,850 mg (original dose ), Intravenous, 1 Day/Dose, 12 of 12 cycles Dose modification: 3,850 mg (Cycle 1)  fluorouracil (ADRUCIL) chemo injection 700 mg, 700 mg (original dose ), Intravenous,  Once, 12 of 12 cycles Dose modification: 700 mg (Cycle 1)  palonosetron (ALOXI) injection 0.25 mg, 0.25 mg, Intravenous,  Once, 7 of 12 cycles Administration: 0.25 mg (09/30/2016)  bevacizumab (AVASTIN) 475 mg in sodium chloride 0.9 % 100 mL chemo infusion, 5 mg/kg = 475 mg, Intravenous,  Once, 6 of 11  cycles Administration: 500 mg (09/30/2016)  leucovorin 880 mg in dextrose 5 % 250 mL infusion, 400 mg/m2 = 880 mg, Intravenous,  Once, 7 of 12 cycles Administration: 880 mg (09/30/2016)  oxaliplatin (ELOXATIN) 185 mg in dextrose 5 % 500 mL chemo infusion, 85 mg/m2 = 185 mg, Intravenous,  Once, 7 of 12 cycles Administration: 185 mg (09/30/2016)  fluorouracil (ADRUCIL) chemo injection 900 mg, 400 mg/m2 = 900 mg, Intravenous,  Once, 7 of 12 cycles Administration: 900 mg (09/30/2016)  fluorouracil (ADRUCIL) 5,300 mg in sodium chloride 0.9 % 144 mL chemo infusion, 2,400 mg/m2 = 5,300 mg, Intravenous, 1 Day/Dose, 7 of 12 cycles Administration: 5,300 mg (09/30/2016)  for chemotherapy treatment.         08/27/2016 Imaging    CT CAP- 1. Pulmonary metastatic disease is stable. 2. Pancreatic head mass, grossly stable, with progressive atrophy of the body and tail of the pancreas. Stable peripancreatic lymph node. 3. Mild circumferential rectal wall thickening, stable. 4. Aortic atherosclerosis (ICD10-170.0). Coronary artery calcification. 5. Hyperattenuating lesion off the lower pole left kidney, stable, too small to characterize. Continued attention on followup exams is warranted. 6. Persistent wall thickening and mild dilatation of the proximal appendix, of uncertain etiology. Continued attention on followup exams is warranted.      11/06/2016 Imaging    CT C/A/P: IMPRESSION: 1. Stable pulmonary metastatic disease. 2. Similar appearing pancreatic head mass. Progressed atrophy of the pancreatic body and tail. Stable peripancreatic lymph node. 3. Stable hypoattenuating lesion partially exophytic off the inferior pole of the left kidney. 4. Aortic atherosclerosis. 5. Persistent mild wall thickening and mild dilatation of the appendix.          INTERVAL HISTORY:  Mr. Flippen 66 y.o. male returns for follow-up for Stage IVA rectal cancer with lung and pancreatic mets; here for consideration of next cycle chemotherapy.   Due for cycle #14 FOLFOX/Avastin today. Therapy was dose-reduced by 25% with cycle #12 d/t progressive fatigue/weakness, diarrhea, and neuropathy.    Overall, he tells me he has been feeling pretty well.  Appetite and energy levels both 75%.  He supplements his diet with Ensure daily.  His rectal pain is improving; the suppositories (Anusol) and fiber supplements have helped a lot with his diarrhea/frequent BMs.  He is also requesting a refill of Lomotil for the diarrhea.  Shares with me that Dr. Woody Seller, his PCP, "gave me a prescription antibiotic for my diarrhea ~4 weeks ago because he was concerned I had a bacterial infection in my stool"; unsure which antibiotics he  took, but he has completed that course of meds.  He has some recent dizziness, which he attributes to starting a new BP medication (Lisinopril); dizziness worse in the mornings when he first wakes up and gets better during the day. He tries to push fluids as tolerated as well.    He continues to have some peripheral neuropathy; he feels like "my fingers are a little worse, but my toes are better."  He is still able to perform all ADLs independently; denies problems with buttons or other fine motor skills with his hands.  He has chronic leg swelling, which has been stable and is largely unchanged.   His wife wants to know when he will have scans again. Otherwise, he is largely with no other complaints today and feels well for his next cycle of chemo today.     REVIEW OF SYSTEMS:  Review of Systems  Constitutional: Positive for fatigue. Negative for chills and fever.  HENT:  Negative.  Negative for lump/mass and nosebleeds.   Eyes: Negative.   Respiratory: Negative.  Negative for cough and shortness of breath.   Cardiovascular: Positive for leg swelling. Negative for chest pain.  Gastrointestinal: Positive for diarrhea and rectal pain (improved ). Negative for abdominal pain, blood in stool, constipation, nausea and vomiting.  Endocrine: Negative.   Genitourinary: Negative.  Negative for dysuria and hematuria.   Musculoskeletal: Negative.  Negative for arthralgias.  Skin: Negative.  Negative for rash.  Neurological: Positive for dizziness and numbness. Negative for headaches.  Hematological: Negative.  Negative for adenopathy. Does not bruise/bleed easily.  Psychiatric/Behavioral: Negative.  Negative for depression and sleep disturbance. The patient is not nervous/anxious.      PAST MEDICAL/SURGICAL HISTORY:  Past Medical History:  Diagnosis Date  . Chronic anticoagulation   . Colon cancer ([place]) 07/24/2010   rectal ca, inv adenocarcinoma  . Depression 04/01/2011  . Diabetes mellitus  without complication ([place])   . DVT (deep venous thrombosis) ([place]) 05/09/2011  . GERD (gastroesophageal reflux disease)   . High output ileostomy ([place]) 05/21/2011  . History of kidney stones   . HTN (hypertension)   . Hx of radiation therapy 09/02/10 to 10/14/10   pelvis  . Lung metastasis ([place])   . Neuropathy   . Peripheral vascular disease ([place])    dvt's,pe  . Pneumonia    hx x3  . Pulmonary embolism ([place])   . Rectal cancer ([place]) 01/15/2011   S/P radiation and concurrent 5-FU continuous infusion from 09/09/10- 10/10/10.  S/P proctectomy with colorectal anastomosis and diverting loop ileostomy on 11/14/10 at [facility] by Dr. Harlon Ditty. Pathology reveals a pT3b N1 with 3/20 lymph nodes.     Past Surgical History:  Procedure Laterality Date  . COLON SURGERY  11/14/2010   proctectomy with colorectal anastomosis and diverting loop ileostomy (temporary planned)  . COLONOSCOPY  07/2010   proximal rectal apple core mass 10-14cm from anal verge (adenocarcinoma), 2-3cm distal rectal carpet polyp s/p piecemeal snare polypectomy (adenoma)  . COLONOSCOPY  04/21/2012   RMR: Friable,fibrotic appearing colorectal anastomosis producing some luminal narrowing-not felt to be critical. path: focal erosion with slight inflammation and hyperemia. SURVEILLANCE DUE DEC 2015  . COLONOSCOPY N/A 11/24/2013   Dr. Rourk:somewhat fibrotic/friable anastomotic mucosal-status post biopsy (narrowing not felt to be clinically significant) Single colonic diverticulum. benign polypoid rectal mucosa  . colostomy reversal  april 2013  . ESOPHAGOGASTRODUODENOSCOPY  07/2010   RMR: schatki ring s/p dilation, small hh, SB bx benign  . ESOPHAGOGASTRODUODENOSCOPY N/A 09/04/2015   Procedure: ESOPHAGOGASTRODUODENOSCOPY (EGD);  Surgeon: Daneil Dolin, MD;  Location: [facility] ENDO [facility];  Service: Endoscopy;  Laterality: N/A;  730  . EUS  08/2010   Dr. Owens Loffler. uT3N0 circumferential, nearly obstruction rectosigmoid adenocarcinoma, distal edge  12cm from anal verge  . EUS N/A 06/19/2016   Procedure: UPPER ENDOSCOPIC ULTRASOUND (EUS) RADIAL;  Surgeon: Milus Banister, MD;  Location: [facility] ENDOSCOPY;  Service: Endoscopy;  Laterality: N/A;  . HERNIA REPAIR     abd hernia repair  . IVC filter    . ivc filter    . port a cath placement    . PORT-A-CATH REMOVAL  09/24/2011   Procedure: REMOVAL PORT-A-CATH;  Surgeon: Donato Heinz, MD;  Location: [facility] ORS;  Service: General;  Laterality: N/A;  Minor Room  . PORTACATH PLACEMENT Right 07/07/2016   Procedure: INSERTION PORT-A-CATH;  Surgeon: Aviva Signs, MD;  Location: [facility] ORS;  Service: General;  Laterality: Right;  .  VIDEO ASSISTED THORACOSCOPY (VATS)/WEDGE RESECTION Right 11/15/2014   Procedure: VIDEO ASSISTED THORACOSCOPY (VATS)/LUNG RESECTION WITH RIGHT LINGULECTOMY;  Surgeon: [name], MD;  Location: [facility];  Service: Thoracic;  Laterality: Right;  Marland Kitchen VIDEO BRONCHOSCOPY N/A 11/15/2014   Procedure: VIDEO BRONCHOSCOPY;  Surgeon: [name], MD;  Location: [facility];  Service: Thoracic;  Laterality: N/A;     SOCIAL HISTORY:  Social History   Social History  . Marital status: Married    Spouse name: N/A  . Number of children: 2  . Years of education: N/A   Occupational History  . 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 Smoking status: Never Smoker  . Smokeless tobacco: Never Used  . Alcohol use No  . Drug use: No  . Sexual activity: Yes    Birth control/ protection: None     Comment: married   Other Topics Concern  . Not on file   Social History Narrative  . No narrative on file    FAMILY HISTORY:  Family History  Problem Relation Age of Onset  . Cancer Brother        throat  . Cancer Brother        prostate  . Colon cancer Neg Hx   . Liver disease Neg Hx   . Inflammatory bowel disease Neg Hx     CURRENT MEDICATIONS:  Outpatient Encounter Prescriptions as of 01/06/2017  Medication Sig   . clonazePAM (KLONOPIN) 0.5 MG tablet   . clonazePAM (KLONOPIN) 1 MG tablet Take 1 tablet (1 mg total) by mouth 2 (two) times daily as needed for anxiety.  Marland Kitchen dexamethasone (DECADRON) 4 MG tablet Take 2 tablets (8 mg total) by mouth daily. Start the day after chemotherapy for 2 days. Take with food.  . Diphenhyd-Hydrocort-Nystatin (FIRST-DUKES MOUTHWASH) SUSP Use as directed 5 mLs in the mouth or throat 4 (four) times daily.  . diphenoxylate-atropine (LOMOTIL) 2.5-0.025 MG tablet Take 1 tablet by mouth 4 (four) times daily as needed for diarrhea or loose stools.  . fentaNYL (DURAGESIC - DOSED MCG/HR) 12 MCG/HR Place 1 patch (12.5 mcg total) onto the skin every 3 (three) days. (Patient not taking: Reported on 01/06/2017)  . fluorouracil CALGB 25956 in sodium chloride 0.9 % 150 mL Inject into the vein over 96 hr.  . gabapentin (NEURONTIN) 300 MG capsule Take 3 capsules (900 mg total) by mouth 2 (two) times daily.  Marland Kitchen glimepiride (AMARYL) 2 MG tablet Take 2 mg by mouth daily with breakfast.   . HYDROcodone-acetaminophen (NORCO/VICODIN) 5-325 MG tablet Take 1-2 tablets by mouth every 6 (six) hours as needed for moderate pain.  . hydrocortisone (ANUSOL-HC) 2.5 % rectal cream Place 1 application rectally 2 (two) times daily.  Marland Kitchen leucovorin in dextrose 5 % 250 mL Inject into the vein once.  . Lidocaine 2 % GEL Apply 1 application topically 4 (four) times daily as needed.  . lidocaine-prilocaine (EMLA) cream Apply to affected area once  . lisinopril (PRINIVIL,ZESTRIL) 10 MG tablet   . mirtazapine (REMERON) 7.5 MG tablet Take 7.5 mg by mouth at bedtime.  . Nitroglycerin 0.4 % OINT Place rectally as needed.  Marland Kitchen omeprazole (PRILOSEC) 20 MG capsule Take 20 mg by mouth 2 (two) times daily before a meal.   . ondansetron (ZOFRAN) 8 MG tablet Take 1 tablet (8 mg total) by mouth 2 (two) times daily as needed for refractory nausea / vomiting. Start on day 3 after chemotherapy.  Marland Kitchen  OXALIPLATIN IV Inject into the vein.    . potassium chloride SA (K-DUR,KLOR-CON) 20 MEQ tablet Take 1 tablet (20 mEq total) by mouth 2 (two) times daily.  . prochlorperazine (COMPAZINE) 10 MG tablet Take 1 tablet (10 mg total) by mouth every 6 (six) hours as needed (Nausea or vomiting).  . psyllium (METAMUCIL) 58.6 % powder Take 1 packet by mouth daily.   . rivaroxaban (XARELTO) 20 MG TABS tablet Take 1 tablet (20 mg total) by mouth daily with supper.  . tamsulosin (FLOMAX) 0.4 MG CAPS capsule Take 0.4 mg by mouth at bedtime.   . traZODone (DESYREL) 100 MG tablet Take 100 mg by mouth at bedtime.  Marland Kitchen zinc oxide 20 % ointment Apply 1 application topically as needed for irritation.  . [DISCONTINUED] ciprofloxacin (CIPRO) 500 MG tablet Take 500 mg by mouth 2 (two) times daily.  . [DISCONTINUED] hydrocortisone (ANUSOL-HC) 25 MG suppository Place 25 mg rectally 1 day or 1 dose.   Facility-Administered Encounter Medications as of 01/06/2017  Medication  . sodium chloride 0.9 % injection 10 mL    ALLERGIES:  Allergies  Allergen Reactions  . Oxycodone     Blisters, hallucinations  . Tramadol     Blisters, hallucinations  . Trazodone And Nefazodone Other (See Comments)    hallucinations     PHYSICAL EXAM:  ECOG Performance status: 1 - Symptomatic; remains independent      Physical Exam  Constitutional: He is oriented to person, place, and time and well-developed, well-nourished, and in no distress.  Seen in chemo chair in infusion area   HENT:  Head: Normocephalic.  Mouth/Throat: Oropharynx is clear and moist.  Eyes: Conjunctivae are normal. No scleral icterus.  Neck: Normal range of motion. Neck supple.  Cardiovascular: Normal rate and regular rhythm.   Pulmonary/Chest: Effort normal and breath sounds normal. No respiratory distress.  Abdominal: Soft. Bowel sounds are normal. There is no tenderness.  Musculoskeletal: Normal range of motion. He exhibits edema (Trace BLE edema).  Lymphadenopathy:    He has no cervical  adenopathy.       Right: No supraclavicular adenopathy present.       Left: No supraclavicular adenopathy present.  Neurological: He is alert and oriented to person, place, and time. No cranial nerve deficit.  Skin: Skin is warm and dry. No rash noted.  Psychiatric: Mood, memory, affect and judgment normal.  Nursing note and vitals reviewed.    LABORATORY DATA:  I have reviewed the labs as listed.  CBC    Component Value Date/Time   WBC 4.2 01/06/2017 0853   RBC 4.24 01/06/2017 0853   HGB 12.7 (L) 01/06/2017 0853   HGB 11.6 (L) 02/04/2011 1059   HCT 38.4 (L) 01/06/2017 0853   HCT 34.7 (L) 02/04/2011 1059   PLT 114 (L) 01/06/2017 0853   PLT 282 02/04/2011 1059   MCV 90.6 01/06/2017 0853   MCV 76.0 (L) 02/04/2011 1059   MCH 30.0 01/06/2017 0853   MCHC 33.1 01/06/2017 0853   RDW 16.0 (H) 01/06/2017 0853   RDW 18.5 (H) 02/04/2011 1059   LYMPHSABS 1.5 01/06/2017 0853   LYMPHSABS 1.1 02/04/2011 1059   MONOABS 0.5 01/06/2017 0853   MONOABS 0.5 02/04/2011 1059   EOSABS 0.2 01/06/2017 0853   EOSABS 0.1 02/04/2011 1059   BASOSABS 0.0 01/06/2017 0853   BASOSABS 0.1 02/04/2011 1059   CMP Latest Ref Rng & Units 01/06/2017 12/23/2016 12/09/2016  Glucose 65 - 99 mg/dL 184(H) 175(H) 199(H)  BUN 6 - 20  mg/dL '7 8 8  '$ Creatinine 0.61 - 1.24 mg/dL 0.59(L) 0.66 0.74  Sodium 135 - 145 mmol/L 140 139 138  Potassium 3.5 - 5.1 mmol/L 3.1(L) 3.2(L) 3.3(L)  Chloride 101 - 111 mmol/L 108 108 105  CO2 22 - 32 mmol/L '23 25 25  '$ Calcium 8.9 - 10.3 mg/dL 8.4(L) 8.3(L) 8.5(L)  Total Protein 6.5 - 8.1 g/dL 5.6(L) 5.8(L) 6.1(L)  Total Bilirubin 0.3 - 1.2 mg/dL 1.0 1.1 1.3(H)  Alkaline Phos 38 - 126 U/L 74 67 64  AST 15 - 41 U/L '26 23 23  '$ ALT 17 - 63 U/L '20 18 17    '$ PENDING LABS:    DIAGNOSTIC IMAGING:  *The following radiologic images and reports have been reviewed independently and agree with below findings.  CT chest/abd/pelvis: 11/06/16 CLINICAL DATA:  Patient with history of stage IV rectal  carcinoma. Restaging evaluation.  EXAM: CT CHEST, ABDOMEN, AND PELVIS WITH CONTRAST  TECHNIQUE: Multidetector CT imaging of the chest, abdomen and pelvis was performed following the standard protocol during bolus administration of intravenous contrast.  CONTRAST:  156m ISOVUE-300 IOPAMIDOL (ISOVUE-300) INJECTION 61%  COMPARISON:  CT CAP 08/26/2016.  FINDINGS: CT CHEST FINDINGS  Cardiovascular: Right anterior chest wall Port-A-Cath is present with tip terminating in the superior vena cava. The heart is normal in size. Coronary vascular calcifications. Aorta and main pulmonary artery normal in caliber.  Mediastinum/Nodes: No enlarged axillary, mediastinal or hilar lymphadenopathy. The esophagus is normal in appearance.  Lungs/Pleura: Central airways are patent. Dependent atelectasis within the bilateral lower lobes. Stable 3 mm left upper lobe nodule (image 60; series 7). Stable 7 mm right upper lobe nodule (image 75; series 7). Stable 6 mm subpleural right upper lobe nodule (image 40; series 7). Stable postsurgical changes within the right middle lobe. No pleural effusion or pneumothorax.  Musculoskeletal: Thoracic spine degenerative changes.  CT ABDOMEN PELVIS FINDINGS  Hepatobiliary: Liver is normal in size and contour. Gallbladder is unremarkable. No intrahepatic or extrahepatic biliary ductal dilatation.  Pancreas: Progressed atrophy of the pancreatic body and tail. Fullness/mass within the head of the pancreas measures 2.1 cm, similar to prior when measured at the same location (image 57; series 4).  Spleen: Unremarkable  Adrenals/Urinary Tract: Re- demonstrated calcifications within the left adrenal gland. Normal appearing right adrenal gland. Kidneys enhance symmetrically with contrast. No hydronephrosis. Urinary bladder is unremarkable. Stable 10 mm exophytic lesion off the inferior pole left kidney (image 88; series 2).  Stomach/Bowel:  Persistent mild dilatation of the distal appendix measuring 8 mm with associated appendicoliths. Unchanged mild wall thickening of the rectum. Stable appearance of the rectosigmoid anastomosis. No evidence for small bowel obstruction. Stool throughout the colon. Stomach is decompressed. Small hiatal hernia. No free fluid or free intraperitoneal air.  Vascular/Lymphatic: Normal caliber abdominal aorta. Peripheral calcified atherosclerotic plaque. Inferior vena cava filter in place. Stable 10 mm peripancreatic lymph node (image 62; series 2)  Reproductive: Dystrophic calcifications in the prostate.  Other: Stable presacral soft tissue thickening. Fat containing inguinal hernias bilaterally.  Musculoskeletal: No aggressive or acute appearing osseous lesions. Lumbar spine degenerative changes.  IMPRESSION: 1. Stable pulmonary metastatic disease. 2. Similar appearing pancreatic head mass. Progressed atrophy of the pancreatic body and tail. Stable peripancreatic lymph node. 3. Stable hypoattenuating lesion partially exophytic off the inferior pole of the left kidney. 4. Aortic atherosclerosis. 5. Persistent mild wall thickening and mild dilatation of the appendix.   Electronically Signed   By: DLovey NewcomerM.D.   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 Underwent concurrent chemoradiation, followed by surgical resection. He went on to have about 5 months of adjuvant FOLFOX chemotherapy followed by additional surgery at [facility]. He was found to be in remission after that time. Subsequent restaging PET imaging in 09/2014 revealed hypermetabolic lung lesion, which was biopsied to show adenocarcinoma consistent with colorectal primary. Underwent wedge resection with Dr. Servando Snare in  11/2014. On 06/13/16, he was noted to have several pleural based nodules representing metastatic disease, as well as pancreatic lesion suspicion for malignancy. Pancreatic lesion was biopsied via EUS and found to be metastatic adenocarcinoma.  Treatment started at [facility] with FOLFOX/Avastin on 07/08/16.   -Most recent restaging imaging with CT chest/abd/pelvis 11/06/16 showed stable disease.  -Due for cycle #14 FOLFOX/Avastin. Both 5-FU and Oxaliplatin were dose-reduced by 25% at cycle #12 d/t progressive fatigue, diarrhea, and neuropathy.  Labs reviewed and are adequate for treatment today.  -Due for restaging CT chest/abd/pelvis in 02/2017; orders placed today.  -Continue chemo every 2 weeks as scheduled.  -Return to cancer center in 02/2017 for follow-up with Dr. Talbert Cage after scans for subsequent treatment after restaging scans.   Peripheral neuropathy:  -Grade 1. Oxaliplatin has previously been dose-reduced by 25%. Encouraged him to let me know if his symptoms worsen, as he may require further dose-reduction.   Diarrhea:  -Likely secondary to chemotherapy.  -Lomotil prescription refilled today.   Hypokalemia:  -Likely d/t diarrhea.  -Will give him 40 mEQ po K-Dur today during treatment.  Historically, his potassium has been low off and on. Discussed with him that we will start oral potassium supplement for him to take at home as well. He agreed. Prescription for K-Dur 20 mEq BID e-scribed to his pharmacy. Encouraged him to start when able.   Rectal pain/Increased urge to defecate:  -Likely d/t cancer. Continue PRN pain medications and suppositories. No refills needed today.          Dispo:  -Continue chemo every 2 weeks as scheduled.  -Restaging CT chest/abd/pelvis due in 02/2017; orders placed today.  -Return to cancer center in 6 weeks for follow-up to review scans and discuss subsequent treatment plans with Dr. Talbert Cage.    All questions were answered to patient's stated  satisfaction. Encouraged patient to call with any new concerns or questions before his next visit to the cancer center and we can certain see him sooner, if needed.    Plan of care discussed with Dr. Talbert Cage, who agrees with the above aforementioned.      Orders placed this encounter:  Orders Placed This Encounter  Procedures  . CT Chest W Contrast  . CT Abdomen Pelvis W Contrast      Mike Craze, NP [place] [phone]

## 2017-01-06 ENCOUNTER — Encounter (HOSPITAL_COMMUNITY): Payer: Medicare Other | Attending: Oncology

## 2017-01-06 ENCOUNTER — Encounter (HOSPITAL_COMMUNITY): Payer: Self-pay | Admitting: Adult Health

## 2017-01-06 ENCOUNTER — Encounter (HOSPITAL_BASED_OUTPATIENT_CLINIC_OR_DEPARTMENT_OTHER): Payer: Medicare Other | Admitting: Adult Health

## 2017-01-06 VITALS — BP 144/78 | HR 64 | Temp 97.6°F | Resp 18 | Wt 201.8 lb

## 2017-01-06 DIAGNOSIS — C20 Malignant neoplasm of rectum: Secondary | ICD-10-CM

## 2017-01-06 DIAGNOSIS — K6289 Other specified diseases of anus and rectum: Secondary | ICD-10-CM | POA: Diagnosis not present

## 2017-01-06 DIAGNOSIS — Z5111 Encounter for antineoplastic chemotherapy: Secondary | ICD-10-CM | POA: Diagnosis present

## 2017-01-06 DIAGNOSIS — C7889 Secondary malignant neoplasm of other digestive organs: Secondary | ICD-10-CM

## 2017-01-06 DIAGNOSIS — T451X5A Adverse effect of antineoplastic and immunosuppressive drugs, initial encounter: Secondary | ICD-10-CM

## 2017-01-06 DIAGNOSIS — G620 Drug-induced polyneuropathy: Secondary | ICD-10-CM | POA: Diagnosis not present

## 2017-01-06 DIAGNOSIS — E876 Hypokalemia: Secondary | ICD-10-CM

## 2017-01-06 DIAGNOSIS — C7801 Secondary malignant neoplasm of right lung: Secondary | ICD-10-CM

## 2017-01-06 DIAGNOSIS — K521 Toxic gastroenteritis and colitis: Secondary | ICD-10-CM | POA: Diagnosis not present

## 2017-01-06 DIAGNOSIS — C7800 Secondary malignant neoplasm of unspecified lung: Principal | ICD-10-CM

## 2017-01-06 LAB — COMPREHENSIVE METABOLIC PANEL
ALT: 20 U/L (ref 17–63)
AST: 26 U/L (ref 15–41)
Albumin: 3.3 g/dL — ABNORMAL LOW (ref 3.5–5.0)
Alkaline Phosphatase: 74 U/L (ref 38–126)
Anion gap: 9 (ref 5–15)
BUN: 7 mg/dL (ref 6–20)
CO2: 23 mmol/L (ref 22–32)
Calcium: 8.4 mg/dL — ABNORMAL LOW (ref 8.9–10.3)
Chloride: 108 mmol/L (ref 101–111)
Creatinine, Ser: 0.59 mg/dL — ABNORMAL LOW (ref 0.61–1.24)
GFR calc Af Amer: >60 mL/min (ref >60)
GFR calc non Af Amer: >60 mL/min (ref >60)
Glucose, Bld: 184 mg/dL — ABNORMAL HIGH (ref 65–99)
Potassium: 3.1 mmol/L — ABNORMAL LOW (ref 3.5–5.1)
Sodium: 140 mmol/L (ref 135–145)
Total Bilirubin: 1 mg/dL (ref 0.3–1.2)
Total Protein: 5.6 g/dL — ABNORMAL LOW (ref 6.5–8.1)

## 2017-01-06 LAB — URINALYSIS, DIPSTICK ONLY
Bilirubin Urine: NEGATIVE
Glucose, UA: NEGATIVE mg/dL
Hgb urine dipstick: NEGATIVE
Ketones, ur: NEGATIVE mg/dL
Leukocytes, UA: NEGATIVE
Nitrite: NEGATIVE
Protein, ur: NEGATIVE mg/dL
Specific Gravity, Urine: 1.019 (ref 1.005–1.030)
pH: 5 (ref 5.0–8.0)

## 2017-01-06 LAB — CBC WITH DIFFERENTIAL/PLATELET
Basophils Absolute: 0 10*3/uL (ref 0.0–0.1)
Basophils Relative: 1 %
Eosinophils Absolute: 0.2 10*3/uL (ref 0.0–0.7)
Eosinophils Relative: 4 %
HCT: 38.4 % — ABNORMAL LOW (ref 39.0–52.0)
Hemoglobin: 12.7 g/dL — ABNORMAL LOW (ref 13.0–17.0)
Lymphocytes Relative: 36 %
Lymphs Abs: 1.5 10*3/uL (ref 0.7–4.0)
MCH: 30 pg (ref 26.0–34.0)
MCHC: 33.1 g/dL (ref 30.0–36.0)
MCV: 90.6 fL (ref 78.0–100.0)
Monocytes Absolute: 0.5 10*3/uL (ref 0.1–1.0)
Monocytes Relative: 12 %
Neutro Abs: 2 10*3/uL (ref 1.7–7.7)
Neutrophils Relative %: 49 %
Platelets: 114 10*3/uL — ABNORMAL LOW (ref 150–400)
RBC: 4.24 MIL/uL (ref 4.22–5.81)
RDW: 16 % — ABNORMAL HIGH (ref 11.5–15.5)
WBC: 4.2 10*3/uL (ref 4.0–10.5)

## 2017-01-06 MED ORDER — DIPHENHYDRAMINE HCL 50 MG/ML IJ SOLN
50.0000 mg | Freq: Once | INTRAMUSCULAR | Status: AC
  Administered 2017-01-06: 50 mg via INTRAVENOUS

## 2017-01-06 MED ORDER — DEXAMETHASONE SODIUM PHOSPHATE 10 MG/ML IJ SOLN
10.0000 mg | Freq: Once | INTRAMUSCULAR | Status: AC
  Administered 2017-01-06: 10 mg via INTRAVENOUS

## 2017-01-06 MED ORDER — SODIUM CHLORIDE 0.9 % IV SOLN
5.4000 mg/kg | Freq: Once | INTRAVENOUS | Status: AC
  Administered 2017-01-06: 500 mg via INTRAVENOUS
  Filled 2017-01-06: qty 4

## 2017-01-06 MED ORDER — DIPHENOXYLATE-ATROPINE 2.5-0.025 MG PO TABS: 1.0000 | ORAL_TABLET | Freq: Four times a day (QID) | ORAL | 1 refills | Status: DC | PRN

## 2017-01-06 MED ORDER — HEPARIN SOD (PORK) LOCK FLUSH 100 UNIT/ML IV SOLN: 500.0000 [IU] | Freq: Once | INTRAVENOUS | Status: DC | PRN

## 2017-01-06 MED ORDER — PALONOSETRON HCL INJECTION 0.25 MG/5ML
INTRAVENOUS | Status: AC
  Filled 2017-01-06: qty 5

## 2017-01-06 MED ORDER — DIPHENHYDRAMINE HCL 50 MG/ML IJ SOLN
INTRAMUSCULAR | Status: AC
  Filled 2017-01-06: qty 1

## 2017-01-06 MED ORDER — SODIUM CHLORIDE 0.9 % IV SOLN
INTRAVENOUS | Status: DC
  Administered 2017-01-06: 10:00:00 via INTRAVENOUS

## 2017-01-06 MED ORDER — LEUCOVORIN CALCIUM INJECTION 350 MG
295.0000 mg/m2 | Freq: Once | INTRAMUSCULAR | Status: AC
  Administered 2017-01-06: 650 mg via INTRAVENOUS
  Filled 2017-01-06: qty 32.5

## 2017-01-06 MED ORDER — FLUOROURACIL CHEMO INJECTION 2.5 GM/50ML
300.0000 mg/m2 | Freq: Once | INTRAVENOUS | Status: AC
  Administered 2017-01-06: 650 mg via INTRAVENOUS
  Filled 2017-01-06: qty 13

## 2017-01-06 MED ORDER — POTASSIUM CHLORIDE CRYS ER 20 MEQ PO TBCR
40.0000 meq | EXTENDED_RELEASE_TABLET | Freq: Once | ORAL | Status: AC
  Administered 2017-01-06: 40 meq via ORAL
  Filled 2017-01-06: qty 2

## 2017-01-06 MED ORDER — SODIUM CHLORIDE 0.9 % IV SOLN
1800.0000 mg/m2 | INTRAVENOUS | Status: DC
  Administered 2017-01-06: 3950 mg via INTRAVENOUS
  Filled 2017-01-06: qty 79

## 2017-01-06 MED ORDER — OXALIPLATIN CHEMO INJECTION 100 MG/20ML
65.0000 mg/m2 | Freq: Once | INTRAVENOUS | Status: AC
  Administered 2017-01-06: 145 mg via INTRAVENOUS
  Filled 2017-01-06: qty 20

## 2017-01-06 MED ORDER — PALONOSETRON HCL INJECTION 0.25 MG/5ML
0.2500 mg | Freq: Once | INTRAVENOUS | Status: AC
  Administered 2017-01-06: 0.25 mg via INTRAVENOUS

## 2017-01-06 MED ORDER — DEXAMETHASONE SODIUM PHOSPHATE 10 MG/ML IJ SOLN
INTRAMUSCULAR | Status: AC
  Filled 2017-01-06: qty 1

## 2017-01-06 MED ORDER — DEXTROSE 5 % IV SOLN
Freq: Once | INTRAVENOUS | Status: AC
  Administered 2017-01-06: 11:00:00 via INTRAVENOUS

## 2017-01-06 MED ORDER — SODIUM CHLORIDE 0.9% FLUSH
10.0000 mL | INTRAVENOUS | Status: DC | PRN
  Administered 2017-01-06: 10 mL
  Filled 2017-01-06: qty 10

## 2017-01-06 MED ORDER — POTASSIUM CHLORIDE CRYS ER 20 MEQ PO TBCR: 20.0000 meq | EXTENDED_RELEASE_TABLET | Freq: Two times a day (BID) | ORAL | 3 refills | Status: DC

## 2017-01-06 NOTE — Progress Notes (Signed)
Patient stated he had diarrhea for 2 days after last treatment and uses lomotil for relief.  Neuropathy in toes are better but he has noticed slight increase in his finger tips.  Stated he can still pick up change, button shirt and zippers on pants. New blood pressure Lisinopril and has noted dizziness in the mornings.  No complaints of pain.  To see oncologist for follow up.  Labs and urine collected.   Reviewed labs with Mike Craze, NP and ok to treat.    Patient tolerated chemotherapy with no complaints.  5FU pushed over 5 minutes and 5FU pump connected to the patient.  Pump working with no alarms or complaints from the patient. Port site clean and dry with no bruising or swelling noted.  Patient reminded of cold sensitivities.  Discharged with VSS and ambulatory with wife.  Reminded of time to return on Thursday.  No complaints with discharge.

## 2017-01-06 NOTE — Patient Instructions (Signed)
[facility] Discharge Instructions for Patients Receiving Chemotherapy  Today you received the following chemotherapy agents Avastin, Oxaliplatin, leucovorin, and 5FU.    To help prevent nausea and vomiting after your treatment, we encourage you to take your nausea medication.    If you develop nausea and vomiting that is not controlled by your nausea medication, call the clinic.   BELOW ARE SYMPTOMS THAT SHOULD BE REPORTED IMMEDIATELY:  *FEVER GREATER THAN 100.5 F  *CHILLS WITH OR WITHOUT FEVER  NAUSEA AND VOMITING THAT IS NOT CONTROLLED WITH YOUR NAUSEA MEDICATION  *UNUSUAL SHORTNESS OF BREATH  *UNUSUAL BRUISING OR BLEEDING  TENDERNESS IN MOUTH AND THROAT WITH OR WITHOUT PRESENCE OF ULCERS  *URINARY PROBLEMS  *BOWEL PROBLEMS  UNUSUAL RASH Items with * indicate a potential emergency and should be followed up as soon as possible.  Feel free to call the clinic you have any questions or concerns. The clinic phone number is [phone].  Please show the [facility] at check-in to the Emergency Department and triage nurse.

## 2017-01-07 MED ORDER — HEPARIN SOD (PORK) LOCK FLUSH 100 UNIT/ML IV SOLN
INTRAVENOUS | Status: AC
  Filled 2017-01-07: qty 5

## 2017-01-08 ENCOUNTER — Encounter (HOSPITAL_COMMUNITY): Payer: Self-pay

## 2017-01-08 ENCOUNTER — Encounter (HOSPITAL_BASED_OUTPATIENT_CLINIC_OR_DEPARTMENT_OTHER): Payer: Medicare Other

## 2017-01-08 VITALS — BP 140/86 | HR 76 | Temp 98.2°F | Resp 18

## 2017-01-08 DIAGNOSIS — C20 Malignant neoplasm of rectum: Secondary | ICD-10-CM

## 2017-01-08 MED ORDER — SODIUM CHLORIDE 0.9% FLUSH
10.0000 mL | INTRAVENOUS | Status: DC | PRN
  Administered 2017-01-08: 10 mL
  Filled 2017-01-08: qty 10

## 2017-01-08 MED ORDER — HEPARIN SOD (PORK) LOCK FLUSH 100 UNIT/ML IV SOLN
500.0000 [IU] | Freq: Once | INTRAVENOUS | Status: AC | PRN
  Administered 2017-01-08: 500 [IU]

## 2017-01-08 NOTE — Progress Notes (Signed)
[name] presents to have home infusion pump d/c'd and for port-a-cath deaccess with flush.  Proper placement of portacath confirmed by CXR.  Portacath located right chest wall accessed with  H 20 needle.  Good blood return present. Portacath flushed with NS and 500U/3ml Heparin, and needle removed intact.  Procedure tolerated well and without incident.  Discharged ambulatory in c/o spouse.

## 2017-01-20 ENCOUNTER — Encounter (HOSPITAL_BASED_OUTPATIENT_CLINIC_OR_DEPARTMENT_OTHER): Payer: Medicare Other

## 2017-01-20 ENCOUNTER — Encounter (HOSPITAL_COMMUNITY): Payer: Self-pay

## 2017-01-20 VITALS — BP 131/77 | HR 92 | Temp 97.3°F | Resp 18 | Wt 194.8 lb

## 2017-01-20 DIAGNOSIS — C7801 Secondary malignant neoplasm of right lung: Secondary | ICD-10-CM | POA: Diagnosis not present

## 2017-01-20 DIAGNOSIS — Z5112 Encounter for antineoplastic immunotherapy: Secondary | ICD-10-CM | POA: Diagnosis present

## 2017-01-20 DIAGNOSIS — C7889 Secondary malignant neoplasm of other digestive organs: Secondary | ICD-10-CM | POA: Diagnosis not present

## 2017-01-20 DIAGNOSIS — C20 Malignant neoplasm of rectum: Secondary | ICD-10-CM

## 2017-01-20 LAB — CBC WITH DIFFERENTIAL/PLATELET
Basophils Absolute: 0 10*3/uL (ref 0.0–0.1)
Basophils Relative: 0 %
Eosinophils Absolute: 0.1 10*3/uL (ref 0.0–0.7)
Eosinophils Relative: 2 %
HCT: 41 % (ref 39.0–52.0)
Hemoglobin: 13.6 g/dL (ref 13.0–17.0)
Lymphocytes Relative: 39 %
Lymphs Abs: 2.2 10*3/uL (ref 0.7–4.0)
MCH: 30.1 pg (ref 26.0–34.0)
MCHC: 33.2 g/dL (ref 30.0–36.0)
MCV: 90.7 fL (ref 78.0–100.0)
Monocytes Absolute: 0.5 10*3/uL (ref 0.1–1.0)
Monocytes Relative: 10 %
Neutro Abs: 2.7 10*3/uL (ref 1.7–7.7)
Neutrophils Relative %: 49 %
Platelets: 158 10*3/uL (ref 150–400)
RBC: 4.52 MIL/uL (ref 4.22–5.81)
RDW: 15.7 % — ABNORMAL HIGH (ref 11.5–15.5)
WBC: 5.7 10*3/uL (ref 4.0–10.5)

## 2017-01-20 LAB — URINALYSIS, DIPSTICK ONLY
Bilirubin Urine: NEGATIVE
Glucose, UA: NEGATIVE mg/dL
Hgb urine dipstick: NEGATIVE
Ketones, ur: NEGATIVE mg/dL
Leukocytes, UA: NEGATIVE
Nitrite: NEGATIVE
Protein, ur: NEGATIVE mg/dL
Specific Gravity, Urine: >1.03 — ABNORMAL HIGH (ref 1.005–1.030)
pH: 5 (ref 5.0–8.0)

## 2017-01-20 LAB — COMPREHENSIVE METABOLIC PANEL
ALT: 23 U/L (ref 17–63)
AST: 29 U/L (ref 15–41)
Albumin: 3.6 g/dL (ref 3.5–5.0)
Alkaline Phosphatase: 80 U/L (ref 38–126)
Anion gap: 10 (ref 5–15)
BUN: 16 mg/dL (ref 6–20)
CO2: 21 mmol/L — ABNORMAL LOW (ref 22–32)
Calcium: 8.7 mg/dL — ABNORMAL LOW (ref 8.9–10.3)
Chloride: 104 mmol/L (ref 101–111)
Creatinine, Ser: 0.84 mg/dL (ref 0.61–1.24)
GFR calc Af Amer: >60 mL/min (ref >60)
GFR calc non Af Amer: >60 mL/min (ref >60)
Glucose, Bld: 226 mg/dL — ABNORMAL HIGH (ref 65–99)
Potassium: 3.9 mmol/L (ref 3.5–5.1)
Sodium: 135 mmol/L (ref 135–145)
Total Bilirubin: 1.3 mg/dL — ABNORMAL HIGH (ref 0.3–1.2)
Total Protein: 6.3 g/dL — ABNORMAL LOW (ref 6.5–8.1)

## 2017-01-20 MED ORDER — FLUOROURACIL CHEMO INJECTION 2.5 GM/50ML
300.0000 mg/m2 | Freq: Once | INTRAVENOUS | Status: AC
  Administered 2017-01-20: 650 mg via INTRAVENOUS
  Filled 2017-01-20: qty 13

## 2017-01-20 MED ORDER — SODIUM CHLORIDE 0.9 % IV SOLN
1800.0000 mg/m2 | INTRAVENOUS | Status: DC
  Administered 2017-01-20: 3850 mg via INTRAVENOUS
  Filled 2017-01-20: qty 77

## 2017-01-20 MED ORDER — DEXAMETHASONE SODIUM PHOSPHATE 10 MG/ML IJ SOLN
10.0000 mg | Freq: Once | INTRAMUSCULAR | Status: AC
Start: 2017-01-20 — End: 2017-01-20
  Administered 2017-01-20: 10 mg via INTRAVENOUS
  Filled 2017-01-20: qty 1

## 2017-01-20 MED ORDER — DEXTROSE 5 % IV SOLN
Freq: Once | INTRAVENOUS | Status: AC
  Administered 2017-01-20: 11:00:00 via INTRAVENOUS

## 2017-01-20 MED ORDER — ONDANSETRON HCL 8 MG PO TABS: 8.0000 mg | ORAL_TABLET | Freq: Two times a day (BID) | ORAL | 1 refills | Status: DC | PRN

## 2017-01-20 MED ORDER — LEUCOVORIN CALCIUM INJECTION 350 MG
302.0000 mg/m2 | Freq: Once | INTRAVENOUS | Status: AC
  Administered 2017-01-20: 650 mg via INTRAVENOUS
  Filled 2017-01-20: qty 32.5

## 2017-01-20 MED ORDER — PROCHLORPERAZINE MALEATE 10 MG PO TABS: 10.0000 mg | ORAL_TABLET | Freq: Four times a day (QID) | ORAL | 1 refills | Status: DC | PRN

## 2017-01-20 MED ORDER — SODIUM CHLORIDE 0.9 % IV SOLN
5.0000 mg/kg | Freq: Once | INTRAVENOUS | Status: AC
  Administered 2017-01-20: 450 mg via INTRAVENOUS
  Filled 2017-01-20: qty 4

## 2017-01-20 MED ORDER — DEXAMETHASONE 4 MG PO TABS: 8.0000 mg | ORAL_TABLET | Freq: Every day | ORAL | 1 refills | Status: DC

## 2017-01-20 MED ORDER — PALONOSETRON HCL INJECTION 0.25 MG/5ML
0.2500 mg | Freq: Once | INTRAVENOUS | Status: AC
  Administered 2017-01-20: 0.25 mg via INTRAVENOUS
  Filled 2017-01-20: qty 5

## 2017-01-20 MED ORDER — DIPHENHYDRAMINE HCL 50 MG/ML IJ SOLN
50.0000 mg | Freq: Once | INTRAMUSCULAR | Status: AC
  Administered 2017-01-20: 50 mg via INTRAVENOUS
  Filled 2017-01-20: qty 1

## 2017-01-20 MED ORDER — OXALIPLATIN CHEMO INJECTION 100 MG/20ML
65.0000 mg/m2 | Freq: Once | INTRAVENOUS | Status: AC
  Administered 2017-01-20: 140 mg via INTRAVENOUS
  Filled 2017-01-20: qty 8

## 2017-01-20 MED ORDER — SODIUM CHLORIDE 0.9% FLUSH
10.0000 mL | INTRAVENOUS | Status: DC | PRN
  Administered 2017-01-20: 10 mL
  Filled 2017-01-20: qty 10

## 2017-01-20 MED ORDER — SODIUM CHLORIDE 0.9 % IV SOLN
INTRAVENOUS | Status: DC
Start: 2017-01-20 — End: 2017-01-20
  Administered 2017-01-20: 09:00:00 via INTRAVENOUS

## 2017-01-20 NOTE — Patient Instructions (Signed)
[facility] Discharge Instructions for Patients Receiving Chemotherapy   Beginning January 23rd 2017 lab work for the [facility] will be done in the  Main lab at [facility] on 1st floor. If you have a lab appointment with the [facility] please come in thru the  Main Entrance and check in at the main information desk   Today you received the following chemotherapy agents Avastin,Oxaliplatin, Leucovorin and 5FU. Follow-up as scheduled. Call clinic for any questions or concerns  To help prevent nausea and vomiting after your treatment, we encourage you to take your nausea medication   If you develop nausea and vomiting, or diarrhea that is not controlled by your medication, call the clinic.  The clinic phone number is [phone]. Office hours are Monday-Friday 8:30am-5:00pm.  BELOW ARE SYMPTOMS THAT SHOULD BE REPORTED IMMEDIATELY:  *FEVER GREATER THAN 101.0 F  *CHILLS WITH OR WITHOUT FEVER  NAUSEA AND VOMITING THAT IS NOT CONTROLLED WITH YOUR NAUSEA MEDICATION  *UNUSUAL SHORTNESS OF BREATH  *UNUSUAL BRUISING OR BLEEDING  TENDERNESS IN MOUTH AND THROAT WITH OR WITHOUT PRESENCE OF ULCERS  *URINARY PROBLEMS  *BOWEL PROBLEMS  UNUSUAL RASH Items with * indicate a potential emergency and should be followed up as soon as possible. If you have an emergency after office hours please contact your primary care physician or go to the nearest emergency department.  Please call the clinic during office hours if you have any questions or concerns.   You may also contact the Patient Navigator at [phone] should you have any questions or need assistance in obtaining follow up care.      Resources For Cancer Patients and their Caregivers ? American Cancer Society: Can assist with transportation, wigs, general needs, runs Look Good Feel Better.        709-522-8375 ? Cancer Care: Provides financial assistance, online support groups, medication/co-pay  assistance.  1-800-813-HOPE 215-555-9920) ? [facility] Assists [place] cancer patients and their families through emotional , educational and financial support.  825 841 3986 ? [place] DSS Where to apply for food stamps, Medicaid and utility assistance. 347 708 2798 ? [employer]: Transportation to medical appointments. 747-727-8623 ? Social Security Administration: May apply for disability if have a Stage IV cancer. [phone] ? [employer], Disability and Transit Services: Assists with nutrition, care and transit needs. [phone]

## 2017-01-20 NOTE — Progress Notes (Signed)
Charles [name] tolerated chemo tx well without complaints or incident. Labs and pt's recent weight loss reviewed with Dr. Talbert Cage prior to administering chemotherapy and pt was approved for treatment today per MD.Pt encouraged to increase his intake of higher calorie foods and Ensure and pt verbalized understanding VSS upon discharge. Pt discharged with 5 FU pump infusing without issues. Pt discharged self ambulatory in satisfactory condition accompanied by his wife

## 2017-01-22 ENCOUNTER — Encounter (HOSPITAL_COMMUNITY): Payer: Self-pay

## 2017-01-22 ENCOUNTER — Encounter (HOSPITAL_BASED_OUTPATIENT_CLINIC_OR_DEPARTMENT_OTHER): Payer: Medicare Other

## 2017-01-22 VITALS — BP 134/94 | HR 105 | Temp 98.1°F | Resp 18

## 2017-01-22 DIAGNOSIS — C20 Malignant neoplasm of rectum: Secondary | ICD-10-CM | POA: Diagnosis present

## 2017-01-22 MED ORDER — SODIUM CHLORIDE 0.9% FLUSH
10.0000 mL | INTRAVENOUS | Status: DC | PRN
  Administered 2017-01-22: 10 mL
  Filled 2017-01-22: qty 10

## 2017-01-22 MED ORDER — HEPARIN SOD (PORK) LOCK FLUSH 100 UNIT/ML IV SOLN
500.0000 [IU] | Freq: Once | INTRAVENOUS | Status: AC | PRN
  Administered 2017-01-22: 500 [IU]
  Filled 2017-01-22: qty 5

## 2017-01-22 NOTE — Progress Notes (Signed)
Charles [name] presents to have home infusion pump d/c'd and for port-a-cath deaccess with flush. Portacath located right chest wall accessed with  H 20 needle.  Good blood return present. Portacath flushed with NS and 500U/74ml Heparin, and needle removed intact.  Procedure tolerated well and without incident.  Discharged ambulatory in c/o spouse.

## 2017-01-27 ENCOUNTER — Telehealth (HOSPITAL_COMMUNITY): Payer: Self-pay | Admitting: *Deleted

## 2017-01-27 ENCOUNTER — Other Ambulatory Visit (HOSPITAL_COMMUNITY): Payer: Self-pay | Admitting: Oncology

## 2017-01-27 DIAGNOSIS — T451X5A Adverse effect of antineoplastic and immunosuppressive drugs, initial encounter: Principal | ICD-10-CM

## 2017-01-27 DIAGNOSIS — G620 Drug-induced polyneuropathy: Secondary | ICD-10-CM

## 2017-01-30 ENCOUNTER — Ambulatory Visit (HOSPITAL_COMMUNITY)
Admission: RE | Admit: 2017-01-30 | Discharge: 2017-01-30 | Disposition: A | Discharge status: Home / Self Care | Payer: Medicare Other | Source: Ambulatory Visit | Attending: Adult Health | Admitting: Adult Health

## 2017-01-30 ENCOUNTER — Telehealth (HOSPITAL_COMMUNITY): Payer: Self-pay

## 2017-01-30 DIAGNOSIS — I700 Atherosclerosis of aorta: Secondary | ICD-10-CM | POA: Diagnosis not present

## 2017-01-30 DIAGNOSIS — R911 Solitary pulmonary nodule: Secondary | ICD-10-CM | POA: Insufficient documentation

## 2017-01-30 DIAGNOSIS — C7800 Secondary malignant neoplasm of unspecified lung: Secondary | ICD-10-CM | POA: Diagnosis not present

## 2017-01-30 DIAGNOSIS — C218 Malignant neoplasm of overlapping sites of rectum, anus and anal canal: Secondary | ICD-10-CM | POA: Diagnosis not present

## 2017-01-30 DIAGNOSIS — M5134 Other intervertebral disc degeneration, thoracic region: Secondary | ICD-10-CM | POA: Diagnosis not present

## 2017-01-30 DIAGNOSIS — Z9889 Other specified postprocedural states: Secondary | ICD-10-CM | POA: Diagnosis not present

## 2017-01-30 DIAGNOSIS — I2510 Atherosclerotic heart disease of native coronary artery without angina pectoris: Secondary | ICD-10-CM | POA: Diagnosis not present

## 2017-01-30 DIAGNOSIS — M5136 Other intervertebral disc degeneration, lumbar region: Secondary | ICD-10-CM | POA: Diagnosis not present

## 2017-01-30 DIAGNOSIS — K381 Appendicular concretions: Secondary | ICD-10-CM | POA: Diagnosis not present

## 2017-01-30 DIAGNOSIS — C20 Malignant neoplasm of rectum: Secondary | ICD-10-CM | POA: Diagnosis not present

## 2017-01-30 MED ORDER — IOPAMIDOL (ISOVUE-300) INJECTION 61%
100.0000 mL | Freq: Once | INTRAVENOUS | Status: AC | PRN
  Administered 2017-01-30: 100 mL via INTRAVENOUS

## 2017-01-30 NOTE — Telephone Encounter (Signed)
Patients wife called stating patient was having diarrhea since having his CT. Explained to wife that the contrast causes most people to have diarrhea. She also states that patients neuropathy has gotten extremely worse since Monday. Prior to Monday , he only had a little tingling in his fingertips. Now his fingertips are num, he cannot hold objects unless they have a handle and he cannot open his pil box. His feet have started getting numb and his mouth is numb and he has no taste. Reviewed with Dr. Lebron Conners, he stated to hold patients FOLFOX on his upcoming treatment, 02/03/17. No changes to patients gabapentin. Notified patients wife. She verbalized understanding.

## 2017-02-03 ENCOUNTER — Encounter (HOSPITAL_COMMUNITY): Payer: Medicare Other | Attending: Oncology

## 2017-02-03 ENCOUNTER — Encounter (HOSPITAL_COMMUNITY): Payer: Self-pay | Admitting: Oncology

## 2017-02-03 ENCOUNTER — Encounter (HOSPITAL_BASED_OUTPATIENT_CLINIC_OR_DEPARTMENT_OTHER): Payer: Medicare Other | Admitting: Oncology

## 2017-02-03 ENCOUNTER — Encounter (HOSPITAL_COMMUNITY): Payer: Self-pay

## 2017-02-03 VITALS — BP 112/74 | HR 75 | Temp 97.0°F | Resp 18 | Wt 198.0 lb

## 2017-02-03 DIAGNOSIS — R197 Diarrhea, unspecified: Secondary | ICD-10-CM | POA: Diagnosis not present

## 2017-02-03 DIAGNOSIS — C7801 Secondary malignant neoplasm of right lung: Secondary | ICD-10-CM

## 2017-02-03 DIAGNOSIS — Z5112 Encounter for antineoplastic immunotherapy: Secondary | ICD-10-CM

## 2017-02-03 DIAGNOSIS — Z923 Personal history of irradiation: Secondary | ICD-10-CM | POA: Diagnosis not present

## 2017-02-03 DIAGNOSIS — Z9221 Personal history of antineoplastic chemotherapy: Secondary | ICD-10-CM | POA: Diagnosis not present

## 2017-02-03 DIAGNOSIS — C7889 Secondary malignant neoplasm of other digestive organs: Secondary | ICD-10-CM

## 2017-02-03 DIAGNOSIS — C20 Malignant neoplasm of rectum: Secondary | ICD-10-CM

## 2017-02-03 DIAGNOSIS — G620 Drug-induced polyneuropathy: Secondary | ICD-10-CM | POA: Diagnosis not present

## 2017-02-03 DIAGNOSIS — K6289 Other specified diseases of anus and rectum: Secondary | ICD-10-CM | POA: Diagnosis not present

## 2017-02-03 LAB — COMPREHENSIVE METABOLIC PANEL
ALT: 22 U/L (ref 17–63)
AST: 28 U/L (ref 15–41)
Albumin: 3.4 g/dL — ABNORMAL LOW (ref 3.5–5.0)
Alkaline Phosphatase: 67 U/L (ref 38–126)
Anion gap: 7 (ref 5–15)
BUN: 11 mg/dL (ref 6–20)
CO2: 23 mmol/L (ref 22–32)
Calcium: 8.6 mg/dL — ABNORMAL LOW (ref 8.9–10.3)
Chloride: 106 mmol/L (ref 101–111)
Creatinine, Ser: 0.89 mg/dL (ref 0.61–1.24)
GFR calc Af Amer: >60 mL/min (ref >60)
GFR calc non Af Amer: >60 mL/min (ref >60)
Glucose, Bld: 216 mg/dL — ABNORMAL HIGH (ref 65–99)
Potassium: 3.9 mmol/L (ref 3.5–5.1)
Sodium: 136 mmol/L (ref 135–145)
Total Bilirubin: 1.2 mg/dL (ref 0.3–1.2)
Total Protein: 5.9 g/dL — ABNORMAL LOW (ref 6.5–8.1)

## 2017-02-03 LAB — CBC WITH DIFFERENTIAL/PLATELET
Basophils Absolute: 0 10*3/uL (ref 0.0–0.1)
Basophils Relative: 0 %
Eosinophils Absolute: 0.1 10*3/uL (ref 0.0–0.7)
Eosinophils Relative: 2 %
HCT: 36.9 % — ABNORMAL LOW (ref 39.0–52.0)
Hemoglobin: 12.2 g/dL — ABNORMAL LOW (ref 13.0–17.0)
Lymphocytes Relative: 29 %
Lymphs Abs: 1.4 10*3/uL (ref 0.7–4.0)
MCH: 30.3 pg (ref 26.0–34.0)
MCHC: 33.1 g/dL (ref 30.0–36.0)
MCV: 91.6 fL (ref 78.0–100.0)
Monocytes Absolute: 0.4 10*3/uL (ref 0.1–1.0)
Monocytes Relative: 8 %
Neutro Abs: 3 10*3/uL (ref 1.7–7.7)
Neutrophils Relative %: 62 %
Platelets: 115 10*3/uL — ABNORMAL LOW (ref 150–400)
RBC: 4.03 MIL/uL — ABNORMAL LOW (ref 4.22–5.81)
RDW: 15.8 % — ABNORMAL HIGH (ref 11.5–15.5)
WBC: 4.8 10*3/uL (ref 4.0–10.5)

## 2017-02-03 LAB — URINALYSIS, DIPSTICK ONLY
Bilirubin Urine: NEGATIVE
Glucose, UA: NEGATIVE mg/dL
Hgb urine dipstick: NEGATIVE
Ketones, ur: NEGATIVE mg/dL
Leukocytes, UA: NEGATIVE
Nitrite: NEGATIVE
Protein, ur: NEGATIVE mg/dL
Specific Gravity, Urine: >1.03 — ABNORMAL HIGH (ref 1.005–1.030)
pH: 5.5 (ref 5.0–8.0)

## 2017-02-03 MED ORDER — LEUCOVORIN CALCIUM INJECTION 350 MG
302.0000 mg/m2 | Freq: Once | INTRAMUSCULAR | Status: DC
  Filled 2017-02-03: qty 32.5

## 2017-02-03 MED ORDER — SODIUM CHLORIDE 0.9 % IV SOLN
INTRAVENOUS | Status: DC
  Administered 2017-02-03: 10:00:00 via INTRAVENOUS

## 2017-02-03 MED ORDER — FLUOROURACIL CHEMO INJECTION 2.5 GM/50ML
300.0000 mg/m2 | Freq: Once | INTRAVENOUS | Status: AC
Start: 2017-02-03 — End: 2017-02-03
  Administered 2017-02-03: 650 mg via INTRAVENOUS
  Filled 2017-02-03: qty 13

## 2017-02-03 MED ORDER — SODIUM CHLORIDE 0.9 % IV SOLN
1800.0000 mg/m2 | INTRAVENOUS | Status: DC
  Administered 2017-02-03: 3850 mg via INTRAVENOUS
  Filled 2017-02-03: qty 77

## 2017-02-03 MED ORDER — SODIUM CHLORIDE 0.9 % IV SOLN
5.0000 mg/kg | Freq: Once | INTRAVENOUS | Status: AC
  Administered 2017-02-03: 450 mg via INTRAVENOUS
  Filled 2017-02-03: qty 2

## 2017-02-03 MED ORDER — INFLUENZA VAC SPLIT QUAD 0.5 ML IM SUSY: 0.5000 mL | PREFILLED_SYRINGE | Freq: Once | INTRAMUSCULAR | Status: DC

## 2017-02-03 MED ORDER — SODIUM CHLORIDE 0.9% FLUSH
10.0000 mL | INTRAVENOUS | Status: DC | PRN
  Administered 2017-02-03: 10 mL
  Filled 2017-02-03: qty 10

## 2017-02-03 MED ORDER — DEXAMETHASONE SODIUM PHOSPHATE 10 MG/ML IJ SOLN
10.0000 mg | Freq: Once | INTRAMUSCULAR | Status: AC
  Administered 2017-02-03: 10 mg via INTRAVENOUS
  Filled 2017-02-03: qty 1

## 2017-02-03 MED ORDER — DEXTROSE 5 % IV SOLN
302.0000 mg/m2 | Freq: Once | INTRAVENOUS | Status: AC
  Administered 2017-02-03: 650 mg via INTRAVENOUS
  Filled 2017-02-03: qty 32.5

## 2017-02-03 MED ORDER — PALONOSETRON HCL INJECTION 0.25 MG/5ML
0.2500 mg | Freq: Once | INTRAVENOUS | Status: AC
  Administered 2017-02-03: 0.25 mg via INTRAVENOUS
  Filled 2017-02-03: qty 5

## 2017-02-03 MED ORDER — DIPHENHYDRAMINE HCL 50 MG/ML IJ SOLN
50.0000 mg | Freq: Once | INTRAMUSCULAR | Status: AC
  Administered 2017-02-03: 50 mg via INTRAVENOUS
  Filled 2017-02-03: qty 1

## 2017-02-03 NOTE — Patient Instructions (Signed)
[facility] Discharge Instructions for Patients Receiving Chemotherapy   Beginning January 23rd 2017 lab work for the [facility] will be done in the  Main lab at [facility] on 1st floor. If you have a lab appointment with the [facility] please come in thru the  Main Entrance and check in at the main information desk   Today you received the following chemotherapy agents Avastin,Leucovorin and 5FU. Follow-up as scheduled and call for any questions or concerns  To help prevent nausea and vomiting after your treatment, we encourage you to take your nausea medication   If you develop nausea and vomiting, or diarrhea that is not controlled by your medication, call the clinic.  The clinic phone number is [phone]. Office hours are Monday-Friday 8:30am-5:00pm.  BELOW ARE SYMPTOMS THAT SHOULD BE REPORTED IMMEDIATELY:  *FEVER GREATER THAN 101.0 F  *CHILLS WITH OR WITHOUT FEVER  NAUSEA AND VOMITING THAT IS NOT CONTROLLED WITH YOUR NAUSEA MEDICATION  *UNUSUAL SHORTNESS OF BREATH  *UNUSUAL BRUISING OR BLEEDING  TENDERNESS IN MOUTH AND THROAT WITH OR WITHOUT PRESENCE OF ULCERS  *URINARY PROBLEMS  *BOWEL PROBLEMS  UNUSUAL RASH Items with * indicate a potential emergency and should be followed up as soon as possible. If you have an emergency after office hours please contact your primary care physician or go to the nearest emergency department.  Please call the clinic during office hours if you have any questions or concerns.   You may also contact the Patient Navigator at [phone] should you have any questions or need assistance in obtaining follow up care.      Resources For Cancer Patients and their Caregivers ? American Cancer Society: Can assist with transportation, wigs, general needs, runs Look Good Feel Better.        (636)299-1647 ? Cancer Care: Provides financial assistance, online support groups, medication/co-pay assistance.   1-800-813-HOPE 337-623-5378) ? [place] Assists [place] cancer patients and their families through emotional , educational and financial support.  (724)810-8812 ? [place] DSS Where to apply for food stamps, Medicaid and utility assistance. 7794195223 ? [employer]: Transportation to medical appointments. 579-423-8342 ? Social Security Administration: May apply for disability if have a Stage IV cancer. ([phone] [phone] 110 5598 ? [employer], Disability and Transit Services: Assists with nutrition, care and transit needs. ([phone]

## 2017-02-03 NOTE — Progress Notes (Signed)
Charles [name] tolerated chemo tx well without complaints or incident. Labs and urine protein reviewed with Dr. Talbert Cage prior to administering chemotherapy. Oxaliplatin held due to increasing neuropathy. VSS Pt discharged with 5FU pump infusing without issues. Pt discharged self ambulatory in satisfactory condition accompanied by his wife

## 2017-02-03 NOTE — Progress Notes (Signed)
Charles [name],  [id number]   CLINIC:  Medical Oncology/Hematology  PCP:  [name], MD [place] [id number] [phone]   REASON FOR VISIT:  Follow-up for Stage IVA adenocarcinoma of rectum with lung and pancreatic mets  CURRENT THERAPY: FOLFOX/Avastin, beginning 07/08/16   BRIEF ONCOLOGIC HISTORY:    Rectal cancer ([place])   07/24/2010 Initial Diagnosis    Invasive adenocarcinoma of rectum      09/09/2010 Concurrent Chemotherapy    S/P radiation and concurrent 5-FU continuous infusion from 09/09/10- 10/10/10.      11/14/2010 Surgery    S/P proctectomy with colorectal anastomosis and diverting loop ileostomy on 11/14/10 at [facility] by Dr. Harlon Ditty. Pathology reveals a pT3b N1 with 3/20 lymph nodes.      02/05/2011 - 07/14/2011 Chemotherapy    FOLFOX      08/18/2011 Surgery    Approximate date of surgery- [place] by Dr. Harlon Ditty       Remission         11/24/2013 Survivorship    Colonoscopy- somewhat fibrotic/friable anastomotic mucosal-status post biopsy (narrowing not felt to be clinically significant).  Negative pathology for malignancy      09/12/2014 Imaging    DVT in the left femoral venous system, left common iliac vein, IVC, and within the IVC filter Right lower extremity venography confirms chronic occlusion of the femoral venous system with collateralization. The right iliac venous system is patent and do      09/25/2014 PET scan    The right middle lobe pulmonary nodule is hypermetabolic, favored to represent a primary bronchogenic carcinoma.Equivocal mediastinal nodes, similar to surrounding blood pool. Bilateral adrenal hypermetabolism, felt to be physiologic      10/24/2014 Pathology Results    Lung, needle/core biopsy(ies), RML - ADENOCARCINOMA, SEE COMMENT metastatic adenocarcinoma of a colorectal primary      10/24/2014 Relapse/Recurrence         11/16/2014 Definitive Surgery    Bronchoscopy,  right video-assisted thoracoscopy, wedge resection of right middle lobe by Dr. Servando Snare      11/16/2014 Pathology Results    Lung, wedge biopsy/resection, right lingula and small portion of middle lobe - METASTATIC ADENOCARCINOMA, CONSISTENT WITH COLORECTAL PRIMARY, SPANNING 2.0 CM. - THE SURGICAL RESECTION MARGINS ARE NEGATIVE FOR ADENOCARCINOMA.      11/16/2014 Remission    THE SURGICAL RESECTION MARGINS ARE NEGATIVE FOR ADENOCARCINOMA.      03/07/2015 Imaging    CT CAP- Interval resection of right middle lobe metastasis. No acute process or evidence of metastatic disease in the chest, abdomen or pelvis. Improved right upper lobe reticular nodular opacities are favored to represent resolving infection.      09/14/2015 Imaging    CT CAP- No findings of recurrent malignancy. No recurrence along the wedge resection site of the right middle lobe. Right anterior abdominal wall focal hernia containing a knuckle of small bowel without complicating feature.      06/13/2016 Imaging    CT CAP- 1. New right-sided pleural metastasis/mass. Other smaller right-sided pulmonary nodules which are all pleural-based and most likely represent pleural metastasis. 2. No convincing evidence of abdominopelvic nodal metastasis. 3. Constellation of findings, including pancreatic atrophy, duct dilatation, and pancreatic head soft tissue fullness which are highly suspicious for pancreatic adenocarcinoma. Metastatic disease felt much less likely. Consider endoscopic ultrasound sampling or ERCP. Cannot exclude superimposed acute pancreatitis. 4. New enlargement of the appendiceal tip with subtle surrounding edema. Cannot  exclude early or mild appendicitis. 5. Coronary artery atherosclerosis. Aortic atherosclerosis. 6. 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      06/13/2016 Progression    CT scan demonstrates progression of disease      06/19/2016 Procedure    EUS with FNA by Dr.  Ardis Hughs      06/20/2016 Pathology Results    FINE NEEDLE ASPIRATION, ENDOSCOPIC, PANCREAS UNCINATE AREA(SPECIMEN 1 OF 1 COLLECTED 06/19/16): MALIGNANT CELLS CONSISTENT WITH METASTATIC ADENOCARCINOMA.      06/25/2016 PET scan    1. Two pleural-based nodules in the right hemithorax are hypermetabolic. Metastatic disease is a distinct consideration. The scattered pulmonary parenchymal nodule seen on previous diagnostic CT imaging are below the threshold for reliable resolution on PET imaging. 2. Hypermetabolic lesion pancreatic head, consistent with neoplasm. As noted on prior CT, adenocarcinoma is any consideration. No evidence for hypermetabolic abdominal lymphadenopathy. Mottled uptake noted in the liver, but no discrete hepatic metastases are evident on PET imaging. 3. Appendix remains distended up to 0.9-10 mm diameter with a stone towards the tip in subtle periappendiceal edema/inflammation. The appendix is hypermetabolic along its length. Imaging features are relatively stable in the 12 day interval since prior CT scan. While appendicitis is a consideration, the relative stability over 12 days would be unusual for that etiology. Continued close follow-up recommended.      06/26/2016 Pathology Results    Not enough tissue for foundationONE or K-ras testing.      07/07/2016 Procedure    Port placed by Dr. Arnoldo Morale      07/08/2016 -  Chemotherapy    The patient had pegfilgrastim (NEULASTA) injection 6 mg, 6 mg, Subcutaneous,  Once, 0 of 4 cycles  ondansetron (ZOFRAN) IVPB 8 mg, 8 mg, Intravenous,  Once, 0 of 4 cycles  leucovorin 804 mg in dextrose 5 % 250 mL infusion, 400 mg/m2, Intravenous,  Once, 0 of 4 cycles  oxaliplatin (ELOXATIN) 170 mg in dextrose 5 % 500 mL chemo infusion, 85 mg/m2, Intravenous,  Once, 0 of 4 cycles  fluorouracil (ADRUCIL) 4,800 mg in sodium chloride 0.9 % 150 mL chemo infusion, 2,400 mg/m2 = 4,800 mg, Intravenous, 1 Day/Dose, 0 of 4 cycles  fluorouracil  (ADRUCIL) chemo injection 800 mg, 400 mg/m2, Intravenous,  Once, 0 of 4 cycles  pegfilgrastim (NEULASTA) injection 6 mg, 6 mg, Subcutaneous,  Once, 2 of 2 cycles  ondansetron (ZOFRAN) IVPB 8 mg, 8 mg, Intravenous,  Once, 12 of 12 cycles  leucovorin 660 mg in dextrose 5 % 250 mL infusion, 660 mg (original dose ), Intravenous,  Once, 12 of 12 cycles Dose modification: 660 mg (Cycle 1)  oxaliplatin (ELOXATIN) 140 mg in dextrose 5 % 500 mL chemo infusion, 140 mg (original dose ), Intravenous,  Once, 12 of 12 cycles Dose modification: 140 mg (Cycle 1), 119 mg (85 % of original dose 140 mg, Cycle 8, Reason: Provider Judgment)  fluorouracil (ADRUCIL) 3,850 mg in sodium chloride 0.9 % 150 mL chemo infusion, 3,850 mg (original dose ), Intravenous, 1 Day/Dose, 12 of 12 cycles Dose modification: 3,850 mg (Cycle 1)  fluorouracil (ADRUCIL) chemo injection 700 mg, 700 mg (original dose ), Intravenous,  Once, 12 of 12 cycles Dose modification: 700 mg (Cycle 1)  palonosetron (ALOXI) injection 0.25 mg, 0.25 mg, Intravenous,  Once, 7 of 12 cycles Administration: 0.25 mg (09/30/2016)  bevacizumab (AVASTIN) 475 mg in sodium chloride 0.9 % 100 mL chemo infusion, 5 mg/kg = 475 mg, Intravenous,  Once, 6 of 11  cycles Administration: 500 mg (09/30/2016)  leucovorin 880 mg in dextrose 5 % 250 mL infusion, 400 mg/m2 = 880 mg, Intravenous,  Once, 7 of 12 cycles Administration: 880 mg (09/30/2016)  oxaliplatin (ELOXATIN) 185 mg in dextrose 5 % 500 mL chemo infusion, 85 mg/m2 = 185 mg, Intravenous,  Once, 7 of 12 cycles Administration: 185 mg (09/30/2016)  fluorouracil (ADRUCIL) chemo injection 900 mg, 400 mg/m2 = 900 mg, Intravenous,  Once, 7 of 12 cycles Administration: 900 mg (09/30/2016)  fluorouracil (ADRUCIL) 5,300 mg in sodium chloride 0.9 % 144 mL chemo infusion, 2,400 mg/m2 = 5,300 mg, Intravenous, 1 Day/Dose, 7 of 12 cycles Administration: 5,300 mg (09/30/2016)  for chemotherapy treatment.         08/27/2016 Imaging    CT CAP- 1. Pulmonary metastatic disease is stable. 2. Pancreatic head mass, grossly stable, with progressive atrophy of the body and tail of the pancreas. Stable peripancreatic lymph node. 3. Mild circumferential rectal wall thickening, stable. 4. Aortic atherosclerosis (ICD10-170.0). Coronary artery calcification. 5. Hyperattenuating lesion off the lower pole left kidney, stable, too small to characterize. Continued attention on followup exams is warranted. 6. Persistent wall thickening and mild dilatation of the proximal appendix, of uncertain etiology. Continued attention on followup exams is warranted.      11/06/2016 Imaging    CT C/A/P: IMPRESSION: 1. Stable pulmonary metastatic disease. 2. Similar appearing pancreatic head mass. Progressed atrophy of the pancreatic body and tail. Stable peripancreatic lymph node. 3. Stable hypoattenuating lesion partially exophytic off the inferior pole of the left kidney. 4. Aortic atherosclerosis. 5. Persistent mild wall thickening and mild dilatation of the appendix.       01/30/2017 Imaging    Ct C/A/P: IMPRESSION: 1. Stable pulmonary metastatic disease. 2. Stable to slight increase in size of pancreatic head mass. 3.  Aortic Atherosclerosis (ICD10-I70.0). 4. Similar appearance of mild wall thickening of the appendix which contains an appendicolith.         INTERVAL HISTORY:  Charles [name] 66 y.o. male returns for follow-up for Stage IVA rectal cancer with lung and pancreatic mets; here for consideration of next cycle chemotherapy.   Due for cycle #16 FOLFOX/Avastin today. Therapy was dose-reduced by 25% with cycle #12 d/t progressive fatigue/weakness, diarrhea, and neuropathy.    Patient states that he has been having worsening neuropathy in his fingers. He was trying to eat toast a few days ago and kept dropping it. He also stated he has some numbness around the perioral region but that has improved. He  has gained 4 pounds since his last visit. His diarrhea is stable. He denies any chest pain, shortness breath, abdominal pain. 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  REVIEW OF SYSTEMS:  Review of Systems  Constitutional: Positive for fatigue. Negative for chills and fever.  HENT:  Negative.  Negative for lump/mass and nosebleeds.   Eyes: Negative.   Respiratory: Negative.  Negative for cough and shortness of breath.   Cardiovascular: Negative for chest pain and leg swelling.  Gastrointestinal: Positive for diarrhea. Negative for abdominal pain, blood in stool, constipation, nausea, rectal pain and vomiting.  Endocrine: Negative.   Genitourinary: Negative.  Negative for dysuria and hematuria.   Musculoskeletal: Negative.  Negative for arthralgias.  Skin: Negative.  Negative for rash.  Neurological: Positive for numbness. Negative for dizziness and headaches.  Hematological: Negative.  Negative for adenopathy. Does not bruise/bleed easily.  Psychiatric/Behavioral: Negative.  Negative for depression and sleep disturbance. The patient is not nervous/anxious.  PAST MEDICAL/SURGICAL HISTORY:  Past Medical History:  Diagnosis Date  . Chronic anticoagulation   . Colon cancer ([place]) 07/24/2010   rectal ca, inv adenocarcinoma  . Depression 04/01/2011  . Diabetes mellitus without complication ([place])   . DVT (deep venous thrombosis) ([place]) 05/09/2011  . GERD (gastroesophageal reflux disease)   . High output ileostomy ([place]) 05/21/2011  . History of kidney stones   . HTN (hypertension)   . Hx of radiation therapy 09/02/10 to 10/14/10   pelvis  . Lung metastasis ([place])   . Neuropathy   . Peripheral vascular disease ([place])    dvt's,pe  . Pneumonia    hx x3  . Pulmonary embolism ([place])   . Rectal cancer ([place]) 01/15/2011   S/P radiation and concurrent 5-FU continuous infusion from 09/09/10- 10/10/10.  S/P proctectomy with colorectal anastomosis and diverting loop ileostomy on 11/14/10 at [facility]  by Dr. Harlon Ditty. Pathology reveals a pT3b N1 with 3/20 lymph nodes.     Past Surgical History:  Procedure Laterality Date  . COLON SURGERY  11/14/2010   proctectomy with colorectal anastomosis and diverting loop ileostomy (temporary planned)  . COLONOSCOPY  07/2010   proximal rectal apple core mass 10-14cm from anal verge (adenocarcinoma), 2-3cm distal rectal carpet polyp s/p piecemeal snare polypectomy (adenoma)  . COLONOSCOPY  04/21/2012   RMR: Friable,fibrotic appearing colorectal anastomosis producing some luminal narrowing-not felt to be critical. path: focal erosion with slight inflammation and hyperemia. SURVEILLANCE DUE DEC 2015  . COLONOSCOPY N/A 11/24/2013   Dr. Rourk:somewhat fibrotic/friable anastomotic mucosal-status post biopsy (narrowing not felt to be clinically significant) Single colonic diverticulum. benign polypoid rectal mucosa  . colostomy reversal  april 2013  . ESOPHAGOGASTRODUODENOSCOPY  07/2010   RMR: schatki ring s/p dilation, small hh, SB bx benign  . ESOPHAGOGASTRODUODENOSCOPY N/A 09/04/2015   Procedure: ESOPHAGOGASTRODUODENOSCOPY (EGD);  Surgeon: Daneil Dolin, MD;  Location: [facility];  Service: Endoscopy;  Laterality: N/A;  730  . EUS  08/2010   Dr. Owens Loffler. uT3N0 circumferential, nearly obstruction rectosigmoid adenocarcinoma, distal edge 12cm from anal verge  . EUS N/A 06/19/2016   Procedure: UPPER ENDOSCOPIC ULTRASOUND (EUS) RADIAL;  Surgeon: Milus Banister, MD;  Location: [facility];  Service: Endoscopy;  Laterality: N/A;  . HERNIA REPAIR     abd hernia repair  . IVC filter    . ivc filter    . port a cath placement    . PORT-A-CATH REMOVAL  09/24/2011   Procedure: REMOVAL PORT-A-CATH;  Surgeon: Donato Heinz, MD;  Location: [facility] ORS;  Service: General;  Laterality: N/A;  Minor Room  . PORTACATH PLACEMENT Right 07/07/2016   Procedure: INSERTION PORT-A-CATH;  Surgeon: Aviva Signs, MD;  Location: [facility] ORS;  Service: General;  Laterality: Right;  Marland Kitchen  VIDEO ASSISTED THORACOSCOPY (VATS)/WEDGE RESECTION Right 11/15/2014   Procedure: VIDEO ASSISTED THORACOSCOPY (VATS)/LUNG RESECTION WITH RIGHT LINGULECTOMY;  Surgeon: [name], MD;  Location: [facility];  Service: Thoracic;  Laterality: Right;  Marland Kitchen VIDEO BRONCHOSCOPY N/A 11/15/2014   Procedure: VIDEO BRONCHOSCOPY;  Surgeon: [name], MD;  Location: [facility];  Service: Thoracic;  Laterality: N/A;     SOCIAL HISTORY:  Social History   Social History  . Marital status: Married    Spouse name: N/A  . Number of children: 2  . Years of education: N/A   Occupational History  . 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 Smoking status: Never Smoker  .  Smokeless tobacco: Never Used  . Alcohol use No  . Drug use: No  . Sexual activity: Yes    Birth control/ protection: None     Comment: married   Other Topics Concern  . Not on file   Social History Narrative  . No narrative on file    FAMILY HISTORY:  Family History  Problem Relation Age of Onset  . Cancer Brother        throat  . Cancer Brother        prostate  . Colon cancer Neg Hx   . Liver disease Neg Hx   . Inflammatory bowel disease Neg Hx     CURRENT MEDICATIONS:  Outpatient Encounter Prescriptions as of 02/03/2017  Medication Sig  . clonazePAM (KLONOPIN) 0.5 MG tablet   . clonazePAM (KLONOPIN) 1 MG tablet Take 1 tablet (1 mg total) by mouth 2 (two) times daily as needed for anxiety.  Marland Kitchen dexamethasone (DECADRON) 4 MG tablet Take 2 tablets (8 mg total) by mouth daily. Start the day after chemotherapy for 2 days. Take with food.  . Diphenhyd-Hydrocort-Nystatin (FIRST-DUKES MOUTHWASH) SUSP Use as directed 5 mLs in the mouth or throat 4 (four) times daily.  . diphenoxylate-atropine (LOMOTIL) 2.5-0.025 MG tablet Take 1 tablet by mouth 4 (four) times daily as needed for diarrhea or loose stools.  . fentaNYL (DURAGESIC - DOSED MCG/HR) 12 MCG/HR Place  1 patch (12.5 mcg total) onto the skin every 3 (three) days. (Patient not taking: Reported on 01/06/2017)  . fluorouracil CALGB 32440 in sodium chloride 0.9 % 150 mL Inject into the vein over 96 hr.  . gabapentin (NEURONTIN) 300 MG capsule TAKE 3 CAPSULES BY MOUTH TWICE DAILY  . glimepiride (AMARYL) 2 MG tablet Take 2 mg by mouth daily with breakfast.   . HYDROcodone-acetaminophen (NORCO/VICODIN) 5-325 MG tablet Take 1-2 tablets by mouth every 6 (six) hours as needed for moderate pain.  . hydrocortisone (ANUSOL-HC) 2.5 % rectal cream Place 1 application rectally 2 (two) times daily.  Marland Kitchen leucovorin in dextrose 5 % 250 mL Inject into the vein once.  . Lidocaine 2 % GEL Apply 1 application topically 4 (four) times daily as needed.  . lidocaine-prilocaine (EMLA) cream Apply to affected area once  . lisinopril (PRINIVIL,ZESTRIL) 10 MG tablet   . mirtazapine (REMERON) 7.5 MG tablet Take 7.5 mg by mouth at bedtime.  . Nitroglycerin 0.4 % OINT Place rectally as needed.  Marland Kitchen omeprazole (PRILOSEC) 20 MG capsule Take 20 mg by mouth 2 (two) times daily before a meal.   . ondansetron (ZOFRAN) 8 MG tablet Take 1 tablet (8 mg total) by mouth 2 (two) times daily as needed for refractory nausea / vomiting. Start on day 3 after chemotherapy.  . OXALIPLATIN IV Inject into the vein.  . potassium chloride SA (K-DUR,KLOR-CON) 20 MEQ tablet Take 1 tablet (20 mEq total) by mouth 2 (two) times daily.  . prochlorperazine (COMPAZINE) 10 MG tablet Take 1 tablet (10 mg total) by mouth every 6 (six) hours as needed (Nausea or vomiting).  . psyllium (METAMUCIL) 58.6 % powder Take 1 packet by mouth daily.   . rivaroxaban (XARELTO) 20 MG TABS tablet Take 1 tablet (20 mg total) by mouth daily with supper.  . tamsulosin (FLOMAX) 0.4 MG CAPS capsule Take 0.4 mg by mouth at bedtime.   . traZODone (DESYREL) 100 MG tablet Take 100 mg by mouth at bedtime.  Marland Kitchen zinc oxide 20 % ointment Apply 1 application topically as needed for irritation.  Facility-Administered Encounter Medications as of 02/03/2017  Medication  . sodium chloride 0.9 % injection 10 mL    ALLERGIES:  Allergies  Allergen Reactions  . Oxycodone     Blisters, hallucinations  . Tramadol     Blisters, hallucinations  . Trazodone And Nefazodone Other (See Comments)    hallucinations     PHYSICAL EXAM:  ECOG Performance status: 1 - Symptomatic; remains independent      Physical Exam  Constitutional: He is oriented to person, place, and time and well-developed, well-nourished, and in no distress.  HENT:  Head: Normocephalic.  Mouth/Throat: Oropharynx is clear and moist.  Eyes: Conjunctivae are normal. No scleral icterus.  Neck: Normal range of motion. Neck supple.  Cardiovascular: Normal rate and regular rhythm.   Pulmonary/Chest: Effort normal and breath sounds normal. No respiratory distress.  Abdominal: Soft. Bowel sounds are normal. There is no tenderness.  Musculoskeletal: Normal range of motion. He exhibits no edema.  Lymphadenopathy:    He has no cervical adenopathy.       Right: No supraclavicular adenopathy present.       Left: No supraclavicular adenopathy present.  Neurological: He is alert and oriented to person, place, and time. No cranial nerve deficit.  Skin: Skin is warm and dry. No rash noted.  Psychiatric: Mood, memory, affect and judgment normal.  Nursing note and vitals reviewed.    LABORATORY DATA:  I have reviewed the labs as listed.  CBC    Component Value Date/Time   WBC 4.8 02/03/2017 0841   RBC 4.03 (L) 02/03/2017 0841   HGB 12.2 (L) 02/03/2017 0841   HGB 11.6 (L) 02/04/2011 1059   HCT 36.9 (L) 02/03/2017 0841   HCT 34.7 (L) 02/04/2011 1059   PLT PENDING 02/03/2017 0841   PLT 282 02/04/2011 1059   MCV 91.6 02/03/2017 0841   MCV 76.0 (L) 02/04/2011 1059   MCH 30.3 02/03/2017 0841   MCHC 33.1 02/03/2017 0841   RDW 15.8 (H) 02/03/2017 0841   RDW 18.5 (H) 02/04/2011 1059   LYMPHSABS 1.4 02/03/2017 0841    LYMPHSABS 1.1 02/04/2011 1059   MONOABS 0.4 02/03/2017 0841   MONOABS 0.5 02/04/2011 1059   EOSABS 0.1 02/03/2017 0841   EOSABS 0.1 02/04/2011 1059   BASOSABS 0.0 02/03/2017 0841   BASOSABS 0.1 02/04/2011 1059   CMP Latest Ref Rng & Units 02/03/2017 01/20/2017 01/06/2017  Glucose 65 - 99 mg/dL 216(H) 226(H) 184(H)  BUN 6 - 20 mg/dL _0 Creatinine 0.61 - 1.24 mg/dL 0.89 0.84 0.59(L)  Sodium 135 - 145 mmol/L 136 135 140  Potassium 3.5 - 5.1 mmol/L 3.9 3.9 3.1(L)  Chloride 101 - 111 mmol/L 106 104 108  CO2 22 - 32 mmol/L 23 21(L) 23  Calcium 8.9 - 10.3 mg/dL 8.6(L) 8.7(L) 8.4(L)  Total Protein 6.5 - 8.1 g/dL 5.9(L) 6.3(L) 5.6(L)  Total Bilirubin 0.3 - 1.2 mg/dL 1.2 1.3(H) 1.0  Alkaline Phos 38 - 126 U/L 67 80 74  AST 15 - 41 U/L _1 ALT 17 - 63 U/L _2 PENDING LABS:    DIAGNOSTIC IMAGING:  *The following radiologic images and reports have been reviewed independently and agree with below findings.  CT chest/abd/pelvis: 11/06/16 CLINICAL DATA:  Patient with history of stage IV rectal carcinoma. Restaging evaluation.  EXAM: CT CHEST, ABDOMEN, AND PELVIS WITH CONTRAST  TECHNIQUE: Multidetector CT imaging of the chest, abdomen and pelvis was performed following the standard protocol during bolus administration  of intravenous contrast.  CONTRAST:  118m ISOVUE-300 IOPAMIDOL (ISOVUE-300) INJECTION 61%  COMPARISON:  CT CAP 08/26/2016.  FINDINGS: CT CHEST FINDINGS  Cardiovascular: Right anterior chest wall Port-A-Cath is present with tip terminating in the superior vena cava. The heart is normal in size. Coronary vascular calcifications. Aorta and main pulmonary artery normal in caliber.  Mediastinum/Nodes: No enlarged axillary, mediastinal or hilar lymphadenopathy. The esophagus is normal in appearance.  Lungs/Pleura: Central airways are patent. Dependent atelectasis within the bilateral lower lobes. Stable 3 mm left upper lobe nodule (image 60;  series 7). Stable 7 mm right upper lobe nodule (image 75; series 7). Stable 6 mm subpleural right upper lobe nodule (image 40; series 7). Stable postsurgical changes within the right middle lobe. No pleural effusion or pneumothorax.  Musculoskeletal: Thoracic spine degenerative changes.  CT ABDOMEN PELVIS FINDINGS  Hepatobiliary: Liver is normal in size and contour. Gallbladder is unremarkable. No intrahepatic or extrahepatic biliary ductal dilatation.  Pancreas: Progressed atrophy of the pancreatic body and tail. Fullness/mass within the head of the pancreas measures 2.1 cm, similar to prior when measured at the same location (image 57; series 4).  Spleen: Unremarkable  Adrenals/Urinary Tract: Re- demonstrated calcifications within the left adrenal gland. Normal appearing right adrenal gland. Kidneys enhance symmetrically with contrast. No hydronephrosis. Urinary bladder is unremarkable. Stable 10 mm exophytic lesion off the inferior pole left kidney (image 88; series 2).  Stomach/Bowel: Persistent mild dilatation of the distal appendix measuring 8 mm with associated appendicoliths. Unchanged mild wall thickening of the rectum. Stable appearance of the rectosigmoid anastomosis. No evidence for small bowel obstruction. Stool throughout the colon. Stomach is decompressed. Small hiatal hernia. No free fluid or free intraperitoneal air.  Vascular/Lymphatic: Normal caliber abdominal aorta. Peripheral calcified atherosclerotic plaque. Inferior vena cava filter in place. Stable 10 mm peripancreatic lymph node (image 62; series 2)  Reproductive: Dystrophic calcifications in the prostate.  Other: Stable presacral soft tissue thickening. Fat containing inguinal hernias bilaterally.  Musculoskeletal: No aggressive or acute appearing osseous lesions. Lumbar spine degenerative changes.  IMPRESSION: 1. Stable pulmonary metastatic disease. 2. Similar appearing pancreatic  head mass. Progressed atrophy of the pancreatic body and tail. Stable peripancreatic lymph node. 3. Stable hypoattenuating lesion partially exophytic off the inferior pole of the left kidney. 4. Aortic atherosclerosis. 5. Persistent mild wall thickening and mild dilatation of the appendix.   Electronically Signed   By: DLovey NewcomerM.D.   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 Underwent concurrent chemoradiation, followed by surgical resection. He went on to have about 5 months of adjuvant FOLFOX chemotherapy [facility] He was found to be in remission after that time. Subsequent restaging PET imaging in 09/2014 revealed hypermetabolic lung lesion, which was biopsied to show adenocarcinoma consistent with colorectal primary. Underwent wedge resection with Dr. GServando Snarein 11/2014. On 06/13/16, he was noted to have several pleural based nodules representing metastatic disease, as well as pancreatic lesion suspicion for malignancy. Pancreatic lesion was biopsied via EUS and found to be metastatic adenocarcinoma.  [facility]   -Reviewed recent restaging imaging with CT chest/abd/pelvis which showed stable disease except for mild increase in his pancreatic lesion to  2.5 cm. Plan to restage him again in 3 months, in early Jan 2019. -Check CEA level. -Due to his worsening grade 3 neuropathy in his hands, I will permanently remove oxaliplatin from his treatment plan.  -Continue chemo every 2 weeks as scheduled with 5FU and Avastin.   Peripheral neuropathy:  -Worsened to grade 3. Discontinued oxaliplatin.  Diarrhea:  -Likely secondary to chemotherapy.  -Continue Lomotil.   Rectal pain/Increased urge  to defecate:  -Likely d/t cancer. Continue PRN pain medications and suppositories. No refills needed today.     Dispo:  -Continue chemo every 2 weeks as scheduled.  -Return to cancer center in 6 weeks for follow-up.   All questions were answered to patient's stated satisfaction. Encouraged patient to call with any new concerns or questions before his next visit to the cancer center and we can certain see him sooner, if needed.    Twana First, MD

## 2017-02-04 ENCOUNTER — Ambulatory Visit: Payer: Medicare Other | Admitting: Gastroenterology

## 2017-02-05 ENCOUNTER — Encounter (HOSPITAL_BASED_OUTPATIENT_CLINIC_OR_DEPARTMENT_OTHER): Payer: Medicare Other

## 2017-02-05 ENCOUNTER — Encounter (HOSPITAL_COMMUNITY): Payer: Self-pay

## 2017-02-05 VITALS — BP 139/86 | HR 79 | Temp 97.7°F | Resp 16

## 2017-02-05 DIAGNOSIS — C20 Malignant neoplasm of rectum: Secondary | ICD-10-CM | POA: Diagnosis present

## 2017-02-05 MED ORDER — HEPARIN SOD (PORK) LOCK FLUSH 100 UNIT/ML IV SOLN
500.0000 [IU] | Freq: Once | INTRAVENOUS | Status: AC | PRN
  Administered 2017-02-05: 500 [IU]

## 2017-02-05 MED ORDER — SODIUM CHLORIDE 0.9% FLUSH
10.0000 mL | INTRAVENOUS | Status: DC | PRN
  Administered 2017-02-05: 10 mL
  Filled 2017-02-05: qty 10

## 2017-02-05 NOTE — Progress Notes (Signed)
[name] returns today for port de access and flush after 46 hr continous infusion of 68fu. Tolerated infusion, patient reports having dizzy spells the night of chemotherapy . Presently at this time is not dizzy. Made MD aware. Will follow as needed Portacath located rightchest wall was  deaccessed and flushed with 41ml NS and 500U/61ml Heparin and needle removed intact.  Procedure without incident. Patient tolerated procedure well.   Also reported that diarrhea was better.   Treatment given per orders. Patient tolerated it well without problems. Vitals stable and discharged home from clinic ambulatory. Follow up as scheduled.

## 2017-02-05 NOTE — Patient Instructions (Signed)
[place] at [facility] Discharge Instructions  RECOMMENDATIONS MADE BY THE CONSULTANT AND ANY TEST RESULTS WILL BE SENT TO YOUR REFERRING PHYSICIAN.  Continous 5FU pump disconnected today Follow up as scheduled.  Thank you for choosing [place] at [facility] to provide your oncology and hematology care.  To afford each patient quality time with our provider, please arrive at least 15 minutes before your scheduled appointment time.    If you have a lab appointment with the [facility] please come in thru the  Main Entrance and check in at the main information desk  You need to re-schedule your appointment should you arrive 10 or more minutes late.  We strive to give you quality time with our providers, and arriving late affects you and other patients whose appointments are after yours.  Also, if you no show three or more times for appointments you may be dismissed from the clinic at the providers discretion.     Again, thank you for choosing [facility].  Our hope is that these requests will decrease the amount of time that you wait before being seen by our physicians.       _____________________________________________________________  Should you have questions after your visit to [facility], please contact our office at [phone] between the hours of 8:30 a.m. and 4:30 p.m.  Voicemails left after 4:30 p.m. will not be returned until the following business day.  For prescription refill requests, have your pharmacy contact our office.       Resources For Cancer Patients and their Caregivers ? American Cancer Society: Can assist with transportation, wigs, general needs, runs Look Good Feel Better.        (352)010-5844 ? Cancer Care: Provides financial assistance, online support groups, medication/co-pay assistance.  1-800-813-HOPE 8577338396) ? [place] Assists [place] cancer  patients and their families through emotional , educational and financial support.  978-887-4336 ? [place] DSS Where to apply for food stamps, Medicaid and utility assistance. (867) 006-3931 ? RCATS: Transportation to medical appointments. 4151370376 ? Social Security Administration: May apply for disability if have a Stage IV cancer. 843-325-9138 [phone]) 004-2621 ? [employer], Disability and Transit Services: Assists with nutrition, care and transit needs. [place] Support Programs: @10RELATIVEDAYS @ > Cancer Support Group  2nd Tuesday of the month 1pm-2pm, Journey Room  > Creative Journey  3rd Tuesday of the month 1130am-1pm, Journey Room  > Look Good Feel Better  1st Wednesday of the month 10am-12 noon, Journey Room (Call [place] to register [phone])

## 2017-02-17 ENCOUNTER — Encounter (HOSPITAL_COMMUNITY): Payer: Self-pay

## 2017-02-17 ENCOUNTER — Encounter (HOSPITAL_BASED_OUTPATIENT_CLINIC_OR_DEPARTMENT_OTHER): Payer: Medicare Other

## 2017-02-17 VITALS — BP 102/61 | HR 74 | Temp 97.9°F | Resp 16 | Wt 200.6 lb

## 2017-02-17 DIAGNOSIS — C7801 Secondary malignant neoplasm of right lung: Secondary | ICD-10-CM

## 2017-02-17 DIAGNOSIS — K6289 Other specified diseases of anus and rectum: Secondary | ICD-10-CM

## 2017-02-17 DIAGNOSIS — C7889 Secondary malignant neoplasm of other digestive organs: Secondary | ICD-10-CM | POA: Diagnosis not present

## 2017-02-17 DIAGNOSIS — K521 Toxic gastroenteritis and colitis: Secondary | ICD-10-CM

## 2017-02-17 DIAGNOSIS — T451X5A Adverse effect of antineoplastic and immunosuppressive drugs, initial encounter: Secondary | ICD-10-CM

## 2017-02-17 DIAGNOSIS — C20 Malignant neoplasm of rectum: Secondary | ICD-10-CM | POA: Diagnosis not present

## 2017-02-17 DIAGNOSIS — C7800 Secondary malignant neoplasm of unspecified lung: Secondary | ICD-10-CM

## 2017-02-17 DIAGNOSIS — Z5112 Encounter for antineoplastic immunotherapy: Secondary | ICD-10-CM | POA: Diagnosis present

## 2017-02-17 LAB — COMPREHENSIVE METABOLIC PANEL
ALT: 37 U/L (ref 17–63)
AST: 32 U/L (ref 15–41)
Albumin: 3.4 g/dL — ABNORMAL LOW (ref 3.5–5.0)
Alkaline Phosphatase: 79 U/L (ref 38–126)
Anion gap: 11 (ref 5–15)
BUN: 9 mg/dL (ref 6–20)
CO2: 24 mmol/L (ref 22–32)
Calcium: 8.7 mg/dL — ABNORMAL LOW (ref 8.9–10.3)
Chloride: 106 mmol/L (ref 101–111)
Creatinine, Ser: 0.76 mg/dL (ref 0.61–1.24)
GFR calc Af Amer: >60 mL/min (ref >60)
GFR calc non Af Amer: >60 mL/min (ref >60)
Glucose, Bld: 163 mg/dL — ABNORMAL HIGH (ref 65–99)
Potassium: 3.6 mmol/L (ref 3.5–5.1)
Sodium: 141 mmol/L (ref 135–145)
Total Bilirubin: 0.8 mg/dL (ref 0.3–1.2)
Total Protein: 6.1 g/dL — ABNORMAL LOW (ref 6.5–8.1)

## 2017-02-17 LAB — URINALYSIS, DIPSTICK ONLY
Bilirubin Urine: NEGATIVE
Glucose, UA: NEGATIVE mg/dL
Hgb urine dipstick: NEGATIVE
Leukocytes, UA: NEGATIVE
Nitrite: NEGATIVE
Protein, ur: NEGATIVE mg/dL
Specific Gravity, Urine: 1.025 (ref 1.005–1.030)
pH: 5.5 (ref 5.0–8.0)

## 2017-02-17 LAB — CBC WITH DIFFERENTIAL/PLATELET
Basophils Absolute: 0 10*3/uL (ref 0.0–0.1)
Basophils Relative: 0 %
Eosinophils Absolute: 0.1 10*3/uL (ref 0.0–0.7)
Eosinophils Relative: 1 %
HCT: 37.9 % — ABNORMAL LOW (ref 39.0–52.0)
Hemoglobin: 12.3 g/dL — ABNORMAL LOW (ref 13.0–17.0)
Lymphocytes Relative: 27 %
Lymphs Abs: 1.7 10*3/uL (ref 0.7–4.0)
MCH: 30.4 pg (ref 26.0–34.0)
MCHC: 32.5 g/dL (ref 30.0–36.0)
MCV: 93.6 fL (ref 78.0–100.0)
Monocytes Absolute: 0.5 10*3/uL (ref 0.1–1.0)
Monocytes Relative: 7 %
Neutro Abs: 4.1 10*3/uL (ref 1.7–7.7)
Neutrophils Relative %: 65 %
Platelets: 166 10*3/uL (ref 150–400)
RBC: 4.05 MIL/uL — ABNORMAL LOW (ref 4.22–5.81)
RDW: 15.4 % (ref 11.5–15.5)
WBC: 6.4 10*3/uL (ref 4.0–10.5)

## 2017-02-17 MED ORDER — DIPHENHYDRAMINE HCL 50 MG/ML IJ SOLN
50.0000 mg | Freq: Once | INTRAMUSCULAR | Status: AC
  Administered 2017-02-17: 50 mg via INTRAVENOUS
  Filled 2017-02-17: qty 1

## 2017-02-17 MED ORDER — SODIUM CHLORIDE 0.9% FLUSH
10.0000 mL | INTRAVENOUS | Status: DC | PRN
  Administered 2017-02-17: 10 mL
  Filled 2017-02-17: qty 10

## 2017-02-17 MED ORDER — DEXAMETHASONE SODIUM PHOSPHATE 10 MG/ML IJ SOLN
10.0000 mg | Freq: Once | INTRAMUSCULAR | Status: AC
  Administered 2017-02-17: 10 mg via INTRAVENOUS
  Filled 2017-02-17: qty 1

## 2017-02-17 MED ORDER — FLUOROURACIL CHEMO INJECTION 2.5 GM/50ML
300.0000 mg/m2 | Freq: Once | INTRAVENOUS | Status: AC
  Administered 2017-02-17: 650 mg via INTRAVENOUS
  Filled 2017-02-17: qty 13

## 2017-02-17 MED ORDER — SODIUM CHLORIDE 0.9 % IV SOLN
INTRAVENOUS | Status: DC
  Administered 2017-02-17: 11:00:00 via INTRAVENOUS

## 2017-02-17 MED ORDER — DRONABINOL 2.5 MG PO CAPS: 2.5000 mg | ORAL_CAPSULE | Freq: Two times a day (BID) | ORAL | 1 refills | Status: DC

## 2017-02-17 MED ORDER — DIPHENOXYLATE-ATROPINE 2.5-0.025 MG PO TABS: 1.0000 | ORAL_TABLET | Freq: Four times a day (QID) | ORAL | 1 refills | Status: DC | PRN

## 2017-02-17 MED ORDER — SODIUM CHLORIDE 0.9 % IV SOLN
5.0000 mg/kg | Freq: Once | INTRAVENOUS | Status: AC
  Administered 2017-02-17: 450 mg via INTRAVENOUS
  Filled 2017-02-17: qty 16

## 2017-02-17 MED ORDER — SODIUM CHLORIDE 0.9 % IV SOLN
1800.0000 mg/m2 | INTRAVENOUS | Status: DC
  Administered 2017-02-17: 3850 mg via INTRAVENOUS
  Filled 2017-02-17: qty 77

## 2017-02-17 MED ORDER — PALONOSETRON HCL INJECTION 0.25 MG/5ML
0.2500 mg | Freq: Once | INTRAVENOUS | Status: AC
  Administered 2017-02-17: 0.25 mg via INTRAVENOUS
  Filled 2017-02-17: qty 5

## 2017-02-17 MED ORDER — LEUCOVORIN CALCIUM INJECTION 350 MG
302.0000 mg/m2 | Freq: Once | INTRAMUSCULAR | Status: AC
  Administered 2017-02-17: 650 mg via INTRAVENOUS
  Filled 2017-02-17: qty 32.5

## 2017-02-17 MED ORDER — HYDROCODONE-ACETAMINOPHEN 5-325 MG PO TABS: 1.0000 | ORAL_TABLET | Freq: Four times a day (QID) | ORAL | 0 refills | Status: DC | PRN

## 2017-02-17 NOTE — Progress Notes (Signed)
Labs reviewed with MD, proceed with treatment today.  Connected continuous 5FU pump today. Treatment given per orders. Patient tolerated it well without problems. Vitals stable and discharged home from clinic ambulatory. Follow up as scheduled.

## 2017-02-17 NOTE — Patient Instructions (Signed)
[facility] Discharge Instructions for Patients Receiving Chemotherapy   Beginning January 23rd 2017 lab work for the Cancer Center will be done in the  Main lab at  on 1st floor. If you have a lab appointment with the Cancer Center please come in thru the  Main Entrance and check in at the main information desk   Today you received the following chemotherapy agents   To help prevent nausea and vomiting after your treatment, we encourage you to take your nausea medication     If you develop nausea and vomiting, or diarrhea that is not controlled by your medication, call the clinic.  The clinic phone number is [phone]. Office hours are Monday-Friday 8:30am-5:00pm.  BELOW ARE SYMPTOMS THAT SHOULD BE REPORTED IMMEDIATELY:  *FEVER GREATER THAN 101.0 F  *CHILLS WITH OR WITHOUT FEVER  NAUSEA AND VOMITING THAT IS NOT CONTROLLED WITH YOUR NAUSEA MEDICATION  *UNUSUAL SHORTNESS OF BREATH  *UNUSUAL BRUISING OR BLEEDING  TENDERNESS IN MOUTH AND THROAT WITH OR WITHOUT PRESENCE OF ULCERS  *URINARY PROBLEMS  *BOWEL PROBLEMS  UNUSUAL RASH Items with * indicate a potential emergency and should be followed up as soon as possible. If you have an emergency after office hours please contact your primary care physician or go to the nearest emergency department.  Please call the clinic during office hours if you have any questions or concerns.   You may also contact the Patient Navigator at [phone] should you have any questions or need assistance in obtaining follow up care.      Resources For Cancer Patients and their Caregivers ? American Cancer Society: Can assist with transportation, wigs, general needs, runs Look Good Feel Better.        1-888-227-6333 ? Cancer Care: Provides financial assistance, online support groups, medication/co-pay assistance.  1-800-813-HOPE (4673) ? [facility] Assists [place] cancer  patients and their families through emotional , educational and financial support.  336-427-4357 ? [place] DSS Where to apply for food stamps, Medicaid and utility assistance. 336-342-1394 ? RCATS: Transportation to medical appointments. 336-347-2287 ? Social Security Administration: May apply for disability if have a Stage IV cancer. 336-342-7796 1-800-772-1213 ? [place] [employer], Disability and Transit Services: Assists with nutrition, care and transit needs. [phone]         

## 2017-02-18 LAB — CEA: CEA: 2.3 ng/mL (ref 0.0–4.7)

## 2017-02-19 ENCOUNTER — Encounter (HOSPITAL_COMMUNITY): Payer: Self-pay

## 2017-02-19 ENCOUNTER — Encounter (HOSPITAL_BASED_OUTPATIENT_CLINIC_OR_DEPARTMENT_OTHER): Payer: Medicare Other

## 2017-02-19 VITALS — BP 128/76 | HR 73 | Temp 97.5°F | Resp 18

## 2017-02-19 DIAGNOSIS — C20 Malignant neoplasm of rectum: Secondary | ICD-10-CM | POA: Diagnosis present

## 2017-02-19 MED ORDER — SODIUM CHLORIDE 0.9% FLUSH
10.0000 mL | INTRAVENOUS | Status: DC | PRN
  Administered 2017-02-19: 10 mL
  Filled 2017-02-19: qty 10

## 2017-02-19 MED ORDER — HEPARIN SOD (PORK) LOCK FLUSH 100 UNIT/ML IV SOLN
500.0000 [IU] | Freq: Once | INTRAVENOUS | Status: AC | PRN
  Administered 2017-02-19: 500 [IU]

## 2017-02-19 NOTE — Progress Notes (Signed)
Charles [name] tolerated discontinuation of 5FU pump with portacath flush well without complaints or incident. 5FU pump discontinued and port flushed with 10 ml NS and 5 ml Heparin easily per protocol. VSS Pt discharged self ambulatory in satisfactory condition accompanied by his wife

## 2017-02-19 NOTE — Patient Instructions (Signed)
[place] at [facility] Discharge Instructions  RECOMMENDATIONS MADE BY THE CONSULTANT AND ANY TEST RESULTS WILL BE SENT TO YOUR REFERRING PHYSICIAN.  5FU pump discontinued with portacath flushed per protocol today. Follow-up as scheduled. Call clinic for any questions or concerns  Thank you for choosing [place] at [facility] to provide your oncology and hematology care.  To afford each patient quality time with our provider, please arrive at least 15 minutes before your scheduled appointment time.    If you have a lab appointment with the [facility] please come in thru the  Main Entrance and check in at the main information desk  You need to re-schedule your appointment should you arrive 10 or more minutes late.  We strive to give you quality time with our providers, and arriving late affects you and other patients whose appointments are after yours.  Also, if you no show three or more times for appointments you may be dismissed from the clinic at the providers discretion.     Again, thank you for choosing [facility].  Our hope is that these requests will decrease the amount of time that you wait before being seen by our physicians.       _____________________________________________________________  Should you have questions after your visit to [facility], please contact our office at [phone] between the hours of 8:30 a.m. and 4:30 p.m.  Voicemails left after 4:30 p.m. will not be returned until the following business day.  For prescription refill requests, have your pharmacy contact our office.       Resources For Cancer Patients and their Caregivers ? American Cancer Society: Can assist with transportation, wigs, general needs, runs Look Good Feel Better.        312-653-6847 ? Cancer Care: Provides financial assistance, online support groups, medication/co-pay assistance.  1-800-813-HOPE  279-262-6560) ? [place] Assists [place] cancer patients and their families through emotional , educational and financial support.  (254) 289-2818 ? [place] DSS Where to apply for food stamps, Medicaid and utility assistance. 607-169-8556 ? RCATS: Transportation to medical appointments. 716-195-8886 ? Social Security Administration: May apply for disability if have a Stage IV cancer. (484)251-9772 (802)016-2157 ? [employer], Disability and Transit Services: Assists with nutrition, care and transit needs. [place] Support Programs: @10RELATIVEDAYS @ > Cancer Support Group  2nd Tuesday of the month 1pm-2pm, Journey Room  > Creative Journey  3rd Tuesday of the month 1130am-1pm, Journey Room  > Look Good Feel Better  1st Wednesday of the month 10am-12 noon, Journey Room (Call [place] to register [phone])

## 2017-02-23 DIAGNOSIS — R5383 Other fatigue: Secondary | ICD-10-CM | POA: Diagnosis not present

## 2017-02-23 DIAGNOSIS — Z0000 Encounter for general adult medical examination without abnormal findings: Secondary | ICD-10-CM | POA: Diagnosis not present

## 2017-02-23 DIAGNOSIS — Z1331 Encounter for screening for depression: Secondary | ICD-10-CM | POA: Diagnosis not present

## 2017-02-23 DIAGNOSIS — I10 Essential (primary) hypertension: Secondary | ICD-10-CM | POA: Diagnosis not present

## 2017-02-23 DIAGNOSIS — F419 Anxiety disorder, unspecified: Secondary | ICD-10-CM | POA: Diagnosis not present

## 2017-02-23 DIAGNOSIS — Z7189 Other specified counseling: Secondary | ICD-10-CM | POA: Diagnosis not present

## 2017-02-23 DIAGNOSIS — Z79899 Other long term (current) drug therapy: Secondary | ICD-10-CM | POA: Diagnosis not present

## 2017-02-23 DIAGNOSIS — Z299 Encounter for prophylactic measures, unspecified: Secondary | ICD-10-CM | POA: Diagnosis not present

## 2017-02-23 DIAGNOSIS — Z1339 Encounter for screening examination for other mental health and behavioral disorders: Secondary | ICD-10-CM | POA: Diagnosis not present

## 2017-02-23 DIAGNOSIS — Z6824 Body mass index (BMI) 24.0-24.9, adult: Secondary | ICD-10-CM | POA: Diagnosis not present

## 2017-02-23 DIAGNOSIS — Z125 Encounter for screening for malignant neoplasm of prostate: Secondary | ICD-10-CM | POA: Diagnosis not present

## 2017-03-03 ENCOUNTER — Encounter (HOSPITAL_COMMUNITY): Payer: Self-pay

## 2017-03-03 ENCOUNTER — Encounter (HOSPITAL_BASED_OUTPATIENT_CLINIC_OR_DEPARTMENT_OTHER): Payer: Medicare Other

## 2017-03-03 VITALS — BP 125/69 | HR 67 | Temp 97.8°F | Resp 18 | Wt 200.4 lb

## 2017-03-03 DIAGNOSIS — C7801 Secondary malignant neoplasm of right lung: Secondary | ICD-10-CM

## 2017-03-03 DIAGNOSIS — C20 Malignant neoplasm of rectum: Secondary | ICD-10-CM | POA: Diagnosis not present

## 2017-03-03 DIAGNOSIS — Z5112 Encounter for antineoplastic immunotherapy: Secondary | ICD-10-CM | POA: Diagnosis present

## 2017-03-03 DIAGNOSIS — C7889 Secondary malignant neoplasm of other digestive organs: Secondary | ICD-10-CM | POA: Diagnosis not present

## 2017-03-03 LAB — COMPREHENSIVE METABOLIC PANEL
ALT: 26 U/L (ref 17–63)
AST: 28 U/L (ref 15–41)
Albumin: 3.6 g/dL (ref 3.5–5.0)
Alkaline Phosphatase: 79 U/L (ref 38–126)
Anion gap: 8 (ref 5–15)
BUN: 13 mg/dL (ref 6–20)
CO2: 23 mmol/L (ref 22–32)
Calcium: 8.9 mg/dL (ref 8.9–10.3)
Chloride: 106 mmol/L (ref 101–111)
Creatinine, Ser: 0.76 mg/dL (ref 0.61–1.24)
GFR calc Af Amer: >60 mL/min (ref >60)
GFR calc non Af Amer: >60 mL/min (ref >60)
Glucose, Bld: 213 mg/dL — ABNORMAL HIGH (ref 65–99)
Potassium: 3.7 mmol/L (ref 3.5–5.1)
Sodium: 137 mmol/L (ref 135–145)
Total Bilirubin: 1 mg/dL (ref 0.3–1.2)
Total Protein: 6.3 g/dL — ABNORMAL LOW (ref 6.5–8.1)

## 2017-03-03 LAB — CBC WITH DIFFERENTIAL/PLATELET
Basophils Absolute: 0 10*3/uL (ref 0.0–0.1)
Basophils Relative: 0 %
Eosinophils Absolute: 0.1 10*3/uL (ref 0.0–0.7)
Eosinophils Relative: 2 %
HCT: 38.5 % — ABNORMAL LOW (ref 39.0–52.0)
Hemoglobin: 12.7 g/dL — ABNORMAL LOW (ref 13.0–17.0)
Lymphocytes Relative: 28 %
Lymphs Abs: 1.8 10*3/uL (ref 0.7–4.0)
MCH: 30.5 pg (ref 26.0–34.0)
MCHC: 33 g/dL (ref 30.0–36.0)
MCV: 92.3 fL (ref 78.0–100.0)
Monocytes Absolute: 0.5 10*3/uL (ref 0.1–1.0)
Monocytes Relative: 8 %
Neutro Abs: 4 10*3/uL (ref 1.7–7.7)
Neutrophils Relative %: 62 %
Platelets: 179 10*3/uL (ref 150–400)
RBC: 4.17 MIL/uL — ABNORMAL LOW (ref 4.22–5.81)
RDW: 15.1 % (ref 11.5–15.5)
WBC: 6.5 10*3/uL (ref 4.0–10.5)

## 2017-03-03 LAB — URINALYSIS, DIPSTICK ONLY
Bilirubin Urine: NEGATIVE
Glucose, UA: NEGATIVE mg/dL
Hgb urine dipstick: NEGATIVE
Ketones, ur: NEGATIVE mg/dL
Leukocytes, UA: NEGATIVE
Nitrite: NEGATIVE
Protein, ur: NEGATIVE mg/dL
Specific Gravity, Urine: 1.02 (ref 1.005–1.030)
pH: 5 (ref 5.0–8.0)

## 2017-03-03 MED ORDER — DEXAMETHASONE SODIUM PHOSPHATE 10 MG/ML IJ SOLN
INTRAMUSCULAR | Status: AC
  Filled 2017-03-03: qty 1

## 2017-03-03 MED ORDER — FLUOROURACIL CHEMO INJECTION 2.5 GM/50ML
300.0000 mg/m2 | Freq: Once | INTRAVENOUS | Status: AC
  Administered 2017-03-03: 650 mg via INTRAVENOUS
  Filled 2017-03-03: qty 13

## 2017-03-03 MED ORDER — SODIUM CHLORIDE 0.9 % IV SOLN
INTRAVENOUS | Status: DC
  Administered 2017-03-03: 11:00:00 via INTRAVENOUS

## 2017-03-03 MED ORDER — PALONOSETRON HCL INJECTION 0.25 MG/5ML
INTRAVENOUS | Status: AC
  Filled 2017-03-03: qty 5

## 2017-03-03 MED ORDER — SODIUM CHLORIDE 0.9 % IV SOLN
5.7000 mg/kg | Freq: Once | INTRAVENOUS | Status: AC
  Administered 2017-03-03: 500 mg via INTRAVENOUS
  Filled 2017-03-03: qty 16

## 2017-03-03 MED ORDER — DEXAMETHASONE SODIUM PHOSPHATE 10 MG/ML IJ SOLN
10.0000 mg | Freq: Once | INTRAMUSCULAR | Status: AC
  Administered 2017-03-03: 10 mg via INTRAVENOUS

## 2017-03-03 MED ORDER — DIPHENHYDRAMINE HCL 50 MG/ML IJ SOLN
INTRAMUSCULAR | Status: AC
  Filled 2017-03-03: qty 1

## 2017-03-03 MED ORDER — DIPHENHYDRAMINE HCL 50 MG/ML IJ SOLN
50.0000 mg | Freq: Once | INTRAMUSCULAR | Status: AC
  Administered 2017-03-03: 50 mg via INTRAVENOUS

## 2017-03-03 MED ORDER — SODIUM CHLORIDE 0.9 % IV SOLN
1800.0000 mg/m2 | INTRAVENOUS | Status: DC
  Administered 2017-03-03: 3850 mg via INTRAVENOUS
  Filled 2017-03-03: qty 77

## 2017-03-03 MED ORDER — LEUCOVORIN CALCIUM INJECTION 350 MG
302.0000 mg/m2 | Freq: Once | INTRAVENOUS | Status: AC
  Administered 2017-03-03: 650 mg via INTRAVENOUS
  Filled 2017-03-03: qty 32.5

## 2017-03-03 MED ORDER — PALONOSETRON HCL INJECTION 0.25 MG/5ML
0.2500 mg | Freq: Once | INTRAVENOUS | Status: AC
  Administered 2017-03-03: 0.25 mg via INTRAVENOUS

## 2017-03-03 MED ORDER — HEPARIN SOD (PORK) LOCK FLUSH 100 UNIT/ML IV SOLN: 500.0000 [IU] | Freq: Once | INTRAVENOUS | Status: DC | PRN

## 2017-03-03 NOTE — Progress Notes (Signed)
Tolerated infusions w/o adverse reaction.  Alert, in no distress.  VSS.  Discharged ambulatory in c/o spouse with ambulatory pump infusing as ordered.

## 2017-03-05 ENCOUNTER — Encounter (HOSPITAL_COMMUNITY): Payer: Self-pay

## 2017-03-05 ENCOUNTER — Encounter (HOSPITAL_COMMUNITY): Payer: Medicare Other | Attending: Oncology

## 2017-03-05 VITALS — BP 143/82 | HR 96 | Temp 98.8°F | Resp 18

## 2017-03-05 DIAGNOSIS — C20 Malignant neoplasm of rectum: Secondary | ICD-10-CM | POA: Diagnosis present

## 2017-03-05 MED ORDER — SODIUM CHLORIDE 0.9% FLUSH
10.0000 mL | INTRAVENOUS | Status: DC | PRN
  Administered 2017-03-05: 10 mL
  Filled 2017-03-05: qty 10

## 2017-03-05 MED ORDER — HEPARIN SOD (PORK) LOCK FLUSH 100 UNIT/ML IV SOLN
500.0000 [IU] | Freq: Once | INTRAVENOUS | Status: AC | PRN
  Administered 2017-03-05: 500 [IU]

## 2017-03-05 MED ORDER — HEPARIN SOD (PORK) LOCK FLUSH 100 UNIT/ML IV SOLN
INTRAVENOUS | Status: AC
  Filled 2017-03-05: qty 5

## 2017-03-05 NOTE — Progress Notes (Signed)
Charles [name] presents to have home infusion pump d/c'd and for port-a-cath deaccess with flush.  Portacath located right chest wall accessed with  H 20 needle.  Good blood return present. Portacath flushed with NS and 500U/34ml Heparin, and needle removed intact.  Procedure tolerated well and without incident.  Discharged ambulatory in c/o spouse.

## 2017-03-11 ENCOUNTER — Other Ambulatory Visit (HOSPITAL_COMMUNITY): Payer: Self-pay

## 2017-03-11 ENCOUNTER — Telehealth (HOSPITAL_COMMUNITY): Payer: Self-pay | Admitting: *Deleted

## 2017-03-11 DIAGNOSIS — K1379 Other lesions of oral mucosa: Secondary | ICD-10-CM

## 2017-03-11 DIAGNOSIS — C20 Malignant neoplasm of rectum: Secondary | ICD-10-CM

## 2017-03-11 MED ORDER — FIRST-DUKES MOUTHWASH MT SUSP: 5.0000 mL | Freq: Four times a day (QID) | OROMUCOSAL | 3 refills | Status: DC

## 2017-03-11 NOTE — Telephone Encounter (Signed)
Received refill request from patients pharmacy for Dukes mouthwash. Reviewed with provider, chart checked and refilled.

## 2017-03-17 ENCOUNTER — Encounter (HOSPITAL_BASED_OUTPATIENT_CLINIC_OR_DEPARTMENT_OTHER): Payer: Medicare Other

## 2017-03-17 ENCOUNTER — Encounter (HOSPITAL_COMMUNITY): Payer: Self-pay | Admitting: Oncology

## 2017-03-17 ENCOUNTER — Encounter (HOSPITAL_BASED_OUTPATIENT_CLINIC_OR_DEPARTMENT_OTHER): Payer: Medicare Other | Admitting: Oncology

## 2017-03-17 ENCOUNTER — Other Ambulatory Visit: Payer: Self-pay

## 2017-03-17 VITALS — BP 121/68 | HR 60 | Temp 98.4°F | Resp 18 | Wt 206.8 lb

## 2017-03-17 DIAGNOSIS — R197 Diarrhea, unspecified: Secondary | ICD-10-CM | POA: Diagnosis not present

## 2017-03-17 DIAGNOSIS — G620 Drug-induced polyneuropathy: Secondary | ICD-10-CM | POA: Diagnosis not present

## 2017-03-17 DIAGNOSIS — C20 Malignant neoplasm of rectum: Secondary | ICD-10-CM

## 2017-03-17 DIAGNOSIS — C7801 Secondary malignant neoplasm of right lung: Secondary | ICD-10-CM

## 2017-03-17 DIAGNOSIS — C7889 Secondary malignant neoplasm of other digestive organs: Secondary | ICD-10-CM | POA: Diagnosis not present

## 2017-03-17 DIAGNOSIS — Z5112 Encounter for antineoplastic immunotherapy: Secondary | ICD-10-CM

## 2017-03-17 LAB — CBC WITH DIFFERENTIAL/PLATELET
Basophils Absolute: 0 10*3/uL (ref 0.0–0.1)
Basophils Relative: 1 %
Eosinophils Absolute: 0.2 10*3/uL (ref 0.0–0.7)
Eosinophils Relative: 3 %
HCT: 36.1 % — ABNORMAL LOW (ref 39.0–52.0)
Hemoglobin: 11.6 g/dL — ABNORMAL LOW (ref 13.0–17.0)
Lymphocytes Relative: 28 %
Lymphs Abs: 1.6 10*3/uL (ref 0.7–4.0)
MCH: 30.1 pg (ref 26.0–34.0)
MCHC: 32.1 g/dL (ref 30.0–36.0)
MCV: 93.8 fL (ref 78.0–100.0)
Monocytes Absolute: 0.5 10*3/uL (ref 0.1–1.0)
Monocytes Relative: 10 %
Neutro Abs: 3.4 10*3/uL (ref 1.7–7.7)
Neutrophils Relative %: 58 %
Platelets: 169 10*3/uL (ref 150–400)
RBC: 3.85 MIL/uL — ABNORMAL LOW (ref 4.22–5.81)
RDW: 14.8 % (ref 11.5–15.5)
WBC: 5.7 10*3/uL (ref 4.0–10.5)

## 2017-03-17 LAB — COMPREHENSIVE METABOLIC PANEL
ALT: 27 U/L (ref 17–63)
AST: 26 U/L (ref 15–41)
Albumin: 3.3 g/dL — ABNORMAL LOW (ref 3.5–5.0)
Alkaline Phosphatase: 70 U/L (ref 38–126)
Anion gap: 6 (ref 5–15)
BUN: 8 mg/dL (ref 6–20)
CO2: 23 mmol/L (ref 22–32)
Calcium: 8.2 mg/dL — ABNORMAL LOW (ref 8.9–10.3)
Chloride: 109 mmol/L (ref 101–111)
Creatinine, Ser: 0.65 mg/dL (ref 0.61–1.24)
GFR calc Af Amer: >60 mL/min (ref >60)
GFR calc non Af Amer: >60 mL/min (ref >60)
Glucose, Bld: 152 mg/dL — ABNORMAL HIGH (ref 65–99)
Potassium: 3.3 mmol/L — ABNORMAL LOW (ref 3.5–5.1)
Sodium: 138 mmol/L (ref 135–145)
Total Bilirubin: 1.1 mg/dL (ref 0.3–1.2)
Total Protein: 5.8 g/dL — ABNORMAL LOW (ref 6.5–8.1)

## 2017-03-17 LAB — URINALYSIS, DIPSTICK ONLY
Bilirubin Urine: NEGATIVE
Glucose, UA: NEGATIVE mg/dL
Hgb urine dipstick: NEGATIVE
Ketones, ur: NEGATIVE mg/dL
Leukocytes, UA: NEGATIVE
Nitrite: NEGATIVE
Protein, ur: NEGATIVE mg/dL
Specific Gravity, Urine: 1.018 (ref 1.005–1.030)
pH: 5 (ref 5.0–8.0)

## 2017-03-17 MED ORDER — BEVACIZUMAB CHEMO INJECTION 400 MG/16ML
5.7000 mg/kg | Freq: Once | INTRAVENOUS | Status: AC
  Administered 2017-03-17: 500 mg via INTRAVENOUS
  Filled 2017-03-17: qty 4

## 2017-03-17 MED ORDER — SODIUM CHLORIDE 0.9 % IV SOLN
1800.0000 mg/m2 | INTRAVENOUS | Status: DC
  Administered 2017-03-17: 3850 mg via INTRAVENOUS
  Filled 2017-03-17: qty 77

## 2017-03-17 MED ORDER — DEXAMETHASONE SODIUM PHOSPHATE 10 MG/ML IJ SOLN
10.0000 mg | Freq: Once | INTRAMUSCULAR | Status: AC
  Administered 2017-03-17: 10 mg via INTRAVENOUS
  Filled 2017-03-17: qty 1

## 2017-03-17 MED ORDER — SODIUM CHLORIDE 0.9% FLUSH
10.0000 mL | INTRAVENOUS | Status: DC | PRN
  Administered 2017-03-17: 10 mL
  Filled 2017-03-17: qty 10

## 2017-03-17 MED ORDER — SODIUM CHLORIDE 0.9 % IV SOLN
INTRAVENOUS | Status: DC
  Administered 2017-03-17: 10:00:00 via INTRAVENOUS

## 2017-03-17 MED ORDER — DEXTROSE 5 % IV SOLN: Freq: Once | INTRAVENOUS | Status: DC

## 2017-03-17 MED ORDER — DARBEPOETIN ALFA 100 MCG/0.5ML IJ SOSY
PREFILLED_SYRINGE | INTRAMUSCULAR | Status: AC
  Filled 2017-03-17: qty 0.5

## 2017-03-17 MED ORDER — LEUCOVORIN CALCIUM INJECTION 350 MG
302.0000 mg/m2 | Freq: Once | INTRAMUSCULAR | Status: AC
  Administered 2017-03-17: 650 mg via INTRAVENOUS
  Filled 2017-03-17: qty 32.5

## 2017-03-17 MED ORDER — FLUOROURACIL CHEMO INJECTION 2.5 GM/50ML
300.0000 mg/m2 | Freq: Once | INTRAVENOUS | Status: AC
  Administered 2017-03-17: 650 mg via INTRAVENOUS
  Filled 2017-03-17: qty 13

## 2017-03-17 MED ORDER — DIPHENHYDRAMINE HCL 50 MG/ML IJ SOLN
50.0000 mg | Freq: Once | INTRAMUSCULAR | Status: AC
  Administered 2017-03-17: 50 mg via INTRAVENOUS
  Filled 2017-03-17: qty 1

## 2017-03-17 MED ORDER — PALONOSETRON HCL INJECTION 0.25 MG/5ML
0.2500 mg | Freq: Once | INTRAVENOUS | Status: AC
  Administered 2017-03-17: 0.25 mg via INTRAVENOUS
  Filled 2017-03-17: qty 5

## 2017-03-17 NOTE — Progress Notes (Signed)
Charles [name], Charles [name] [id number]   CLINIC:  Medical Oncology/Hematology  PCP:  [name], MD [place] [address] [phone]   REASON FOR VISIT:  Follow-up for Stage IVA adenocarcinoma of rectum with lung and pancreatic mets  CURRENT THERAPY: FOLFOX/Avastin, beginning 07/08/16   BRIEF ONCOLOGIC HISTORY:    Rectal cancer ([place])   07/24/2010 Initial Diagnosis    Invasive adenocarcinoma of rectum      09/09/2010 Concurrent Chemotherapy    S/P radiation and concurrent 5-FU continuous infusion from 09/09/10- 10/10/10.      11/14/2010 Surgery    S/P proctectomy with colorectal anastomosis and diverting loop ileostomy on 11/14/10 at [facility] by Dr. Harlon Ditty. Pathology reveals a pT3b N1 with 3/20 lymph nodes.      02/05/2011 - 07/14/2011 Chemotherapy    FOLFOX      08/18/2011 Surgery    Approximate date of surgery- [place] by Dr. Harlon Ditty       Remission         11/24/2013 Survivorship    Colonoscopy- somewhat fibrotic/friable anastomotic mucosal-status post biopsy (narrowing not felt to be clinically significant).  Negative pathology for malignancy      09/12/2014 Imaging    DVT in the left femoral venous system, left common iliac vein, IVC, and within the IVC filter Right lower extremity venography confirms chronic occlusion of the femoral venous system with collateralization. The right iliac venous system is patent and do      09/25/2014 PET scan    The right middle lobe pulmonary nodule is hypermetabolic, favored to represent a primary bronchogenic carcinoma.Equivocal mediastinal nodes, similar to surrounding blood pool. Bilateral adrenal hypermetabolism, felt to be physiologic      10/24/2014 Pathology Results    Lung, needle/core biopsy(ies), RML - ADENOCARCINOMA, SEE COMMENT metastatic adenocarcinoma of a colorectal primary      10/24/2014 Relapse/Recurrence         11/16/2014 Definitive Surgery    Bronchoscopy,  right video-assisted thoracoscopy, wedge resection of right middle lobe by Dr. Servando Snare      11/16/2014 Pathology Results    Lung, wedge biopsy/resection, right lingula and small portion of middle lobe - METASTATIC ADENOCARCINOMA, CONSISTENT WITH COLORECTAL PRIMARY, SPANNING 2.0 CM. - THE SURGICAL RESECTION MARGINS ARE NEGATIVE FOR ADENOCARCINOMA.      11/16/2014 Remission    THE SURGICAL RESECTION MARGINS ARE NEGATIVE FOR ADENOCARCINOMA.      03/07/2015 Imaging    CT CAP- Interval resection of right middle lobe metastasis. No acute process or evidence of metastatic disease in the chest, abdomen or pelvis. Improved right upper lobe reticular nodular opacities are favored to represent resolving infection.      09/14/2015 Imaging    CT CAP- No findings of recurrent malignancy. No recurrence along the wedge resection site of the right middle lobe. Right anterior abdominal wall focal hernia containing a knuckle of small bowel without complicating feature.      06/13/2016 Imaging    CT CAP- 1. New right-sided pleural metastasis/mass. Other smaller right-sided pulmonary nodules which are all pleural-based and most likely represent pleural metastasis. 2. No convincing evidence of abdominopelvic nodal metastasis. 3. Constellation of findings, including pancreatic atrophy, duct dilatation, and pancreatic head soft tissue fullness which are highly suspicious for pancreatic adenocarcinoma. Metastatic disease felt much less likely. Consider endoscopic ultrasound sampling or ERCP. Cannot exclude superimposed acute pancreatitis. 4. New enlargement of the appendiceal tip with subtle surrounding edema. Cannot  exclude early or mild appendicitis. 5. Coronary artery atherosclerosis. Aortic atherosclerosis. 6. 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      06/13/2016 Progression    CT scan demonstrates progression of disease      06/19/2016 Procedure    EUS with FNA by Dr.  Ardis Hughs      06/20/2016 Pathology Results    FINE NEEDLE ASPIRATION, ENDOSCOPIC, PANCREAS UNCINATE AREA(SPECIMEN 1 OF 1 COLLECTED 06/19/16): MALIGNANT CELLS CONSISTENT WITH METASTATIC ADENOCARCINOMA.      06/25/2016 PET scan    1. Two pleural-based nodules in the right hemithorax are hypermetabolic. Metastatic disease is a distinct consideration. The scattered pulmonary parenchymal nodule seen on previous diagnostic CT imaging are below the threshold for reliable resolution on PET imaging. 2. Hypermetabolic lesion pancreatic head, consistent with neoplasm. As noted on prior CT, adenocarcinoma is any consideration. No evidence for hypermetabolic abdominal lymphadenopathy. Mottled uptake noted in the liver, but no discrete hepatic metastases are evident on PET imaging. 3. Appendix remains distended up to 0.9-10 mm diameter with a stone towards the tip in subtle periappendiceal edema/inflammation. The appendix is hypermetabolic along its length. Imaging features are relatively stable in the 12 day interval since prior CT scan. While appendicitis is a consideration, the relative stability over 12 days would be unusual for that etiology. Continued close follow-up recommended.      06/26/2016 Pathology Results    Not enough tissue for foundationONE or K-ras testing.      07/07/2016 Procedure    Port placed by Dr. Arnoldo Morale      07/08/2016 -  Chemotherapy    The patient had pegfilgrastim (NEULASTA) injection 6 mg, 6 mg, Subcutaneous,  Once, 0 of 4 cycles  ondansetron (ZOFRAN) IVPB 8 mg, 8 mg, Intravenous,  Once, 0 of 4 cycles  leucovorin 804 mg in dextrose 5 % 250 mL infusion, 400 mg/m2, Intravenous,  Once, 0 of 4 cycles  oxaliplatin (ELOXATIN) 170 mg in dextrose 5 % 500 mL chemo infusion, 85 mg/m2, Intravenous,  Once, 0 of 4 cycles  fluorouracil (ADRUCIL) 4,800 mg in sodium chloride 0.9 % 150 mL chemo infusion, 2,400 mg/m2 = 4,800 mg, Intravenous, 1 Day/Dose, 0 of 4 cycles  fluorouracil  (ADRUCIL) chemo injection 800 mg, 400 mg/m2, Intravenous,  Once, 0 of 4 cycles  pegfilgrastim (NEULASTA) injection 6 mg, 6 mg, Subcutaneous,  Once, 2 of 2 cycles  ondansetron (ZOFRAN) IVPB 8 mg, 8 mg, Intravenous,  Once, 12 of 12 cycles  leucovorin 660 mg in dextrose 5 % 250 mL infusion, 660 mg (original dose ), Intravenous,  Once, 12 of 12 cycles Dose modification: 660 mg (Cycle 1)  oxaliplatin (ELOXATIN) 140 mg in dextrose 5 % 500 mL chemo infusion, 140 mg (original dose ), Intravenous,  Once, 12 of 12 cycles Dose modification: 140 mg (Cycle 1), 119 mg (85 % of original dose 140 mg, Cycle 8, Reason: Provider Judgment)  fluorouracil (ADRUCIL) 3,850 mg in sodium chloride 0.9 % 150 mL chemo infusion, 3,850 mg (original dose ), Intravenous, 1 Day/Dose, 12 of 12 cycles Dose modification: 3,850 mg (Cycle 1)  fluorouracil (ADRUCIL) chemo injection 700 mg, 700 mg (original dose ), Intravenous,  Once, 12 of 12 cycles Dose modification: 700 mg (Cycle 1)  palonosetron (ALOXI) injection 0.25 mg, 0.25 mg, Intravenous,  Once, 7 of 12 cycles Administration: 0.25 mg (09/30/2016)  bevacizumab (AVASTIN) 475 mg in sodium chloride 0.9 % 100 mL chemo infusion, 5 mg/kg = 475 mg, Intravenous,  Once, 6 of 11  cycles Administration: 500 mg (09/30/2016)  leucovorin 880 mg in dextrose 5 % 250 mL infusion, 400 mg/m2 = 880 mg, Intravenous,  Once, 7 of 12 cycles Administration: 880 mg (09/30/2016)  oxaliplatin (ELOXATIN) 185 mg in dextrose 5 % 500 mL chemo infusion, 85 mg/m2 = 185 mg, Intravenous,  Once, 7 of 12 cycles Administration: 185 mg (09/30/2016)  fluorouracil (ADRUCIL) chemo injection 900 mg, 400 mg/m2 = 900 mg, Intravenous,  Once, 7 of 12 cycles Administration: 900 mg (09/30/2016)  fluorouracil (ADRUCIL) 5,300 mg in sodium chloride 0.9 % 144 mL chemo infusion, 2,400 mg/m2 = 5,300 mg, Intravenous, 1 Day/Dose, 7 of 12 cycles Administration: 5,300 mg (09/30/2016)  for chemotherapy treatment.         08/27/2016 Imaging    CT CAP- 1. Pulmonary metastatic disease is stable. 2. Pancreatic head mass, grossly stable, with progressive atrophy of the body and tail of the pancreas. Stable peripancreatic lymph node. 3. Mild circumferential rectal wall thickening, stable. 4. Aortic atherosclerosis (ICD10-170.0). Coronary artery calcification. 5. Hyperattenuating lesion off the lower pole left kidney, stable, too small to characterize. Continued attention on followup exams is warranted. 6. Persistent wall thickening and mild dilatation of the proximal appendix, of uncertain etiology. Continued attention on followup exams is warranted.      11/06/2016 Imaging    CT C/A/P: IMPRESSION: 1. Stable pulmonary metastatic disease. 2. Similar appearing pancreatic head mass. Progressed atrophy of the pancreatic body and tail. Stable peripancreatic lymph node. 3. Stable hypoattenuating lesion partially exophytic off the inferior pole of the left kidney. 4. Aortic atherosclerosis. 5. Persistent mild wall thickening and mild dilatation of the appendix.       01/30/2017 Imaging    Ct C/A/P: IMPRESSION: 1. Stable pulmonary metastatic disease. 2. Stable to slight increase in size of pancreatic head mass. 3.  Aortic Atherosclerosis (ICD10-I70.0). 4. Similar appearance of mild wall thickening of the appendix which contains an appendicolith.         INTERVAL HISTORY:  Mr. Attia 66 y.o. male returns for follow-up for Stage IVA rectal cancer with lung and pancreatic mets; here for consideration of next cycle chemotherapy.   Due for cycle #19 FOLFOX/Avastin today. Therapy has been dose-reduced by 25% with cycle #12 d/t progressive fatigue/weakness, diarrhea, and neuropathy.  Oxaliplatin permanently discontinued due to worsening neuropathy with cycle 16 of treatment. Patient states that he has been doing very well the past 2 weeks. His diarrhea has improved except for one day when he had more  diarrhea after eating spicy food. His anal fissures have healed and are no longer painful. He states his neuropathy is stable. He still feels fatigued but states he is sleeping a lot better than before. 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  REVIEW OF SYSTEMS:  Review of Systems  Constitutional: Positive for fatigue. Negative for chills and fever.  HENT:  Negative.  Negative for lump/mass and nosebleeds.   Eyes: Negative.   Respiratory: Negative.  Negative for cough and shortness of breath.   Cardiovascular: Negative for chest pain and leg swelling.  Gastrointestinal: Positive for diarrhea. Negative for abdominal pain, blood in stool, constipation, nausea, rectal pain and vomiting.  Endocrine: Negative.   Genitourinary: Negative.  Negative for dysuria and hematuria.   Musculoskeletal: Negative.  Negative for arthralgias.  Skin: Negative.  Negative for rash.  Neurological: Positive for numbness. Negative for dizziness and headaches.  Hematological: Negative.  Negative for adenopathy. Does not bruise/bleed easily.  Psychiatric/Behavioral: Negative.  Negative for depression and sleep disturbance. The patient is not nervous/anxious.      PAST MEDICAL/SURGICAL HISTORY:  Past Medical History:  Diagnosis Date  . Chronic anticoagulation   . Colon cancer ([place]) 07/24/2010   rectal ca, inv adenocarcinoma  . Depression 04/01/2011  . Diabetes mellitus without complication ([place])   . DVT (deep venous thrombosis) ([place]) 05/09/2011  . GERD (gastroesophageal reflux disease)   . High output ileostomy ([place]) 05/21/2011  . History of kidney stones   . HTN (hypertension)   . Hx of radiation therapy 09/02/10 to 10/14/10   pelvis  . Lung metastasis ([place])   . Neuropathy   . Peripheral vascular disease ([place])    dvt's,pe  . Pneumonia    hx x3  . Pulmonary embolism ([place])   . Rectal cancer ([place]) 01/15/2011   S/P radiation and concurrent 5-FU continuous infusion from 09/09/10-  10/10/10.  S/P proctectomy with colorectal anastomosis and diverting loop ileostomy on 11/14/10 at [facility] by Dr. Harlon Ditty. Pathology reveals a pT3b N1 with 3/20 lymph nodes.     Past Surgical History:  Procedure Laterality Date  . COLON SURGERY  11/14/2010   proctectomy with colorectal anastomosis and diverting loop ileostomy (temporary planned)  . COLONOSCOPY  07/2010   proximal rectal apple core mass 10-14cm from anal verge (adenocarcinoma), 2-3cm distal rectal carpet polyp s/p piecemeal snare polypectomy (adenoma)  . colostomy reversal  april 2013  . ESOPHAGOGASTRODUODENOSCOPY  07/2010   RMR: schatki ring s/p dilation, small hh, SB bx benign  . EUS  08/2010   Dr. Owens Loffler. uT3N0 circumferential, nearly obstruction rectosigmoid adenocarcinoma, distal edge 12cm from anal verge  . HERNIA REPAIR     abd hernia repair  . IVC filter    . ivc filter    . port a cath placement       SOCIAL HISTORY:  Social History   Socioeconomic History  . Marital status: Married    Spouse name: Not on file  . Number of children: 2  . Years of education: Not on file  . Highest education level: Not on file  Social Needs  . Financial resource strain: Not on file  . Food insecurity - worry: Not on file  . Food insecurity - inability: Not on file  . Transportation needs - medical: Not on file  . Transportation needs - non-medical: Not on file  Occupational History  . Occupation: self-employed Regulatory affairs officer, currently not working    Fish farm manager: SELF EMPLOYED  Tobacco Use  . Smoking status: Never Smoker  . Smokeless tobacco: Never Used  Substance and Sexual Activity  . Alcohol use: No  . Drug use: No  . Sexual activity: Yes    Birth control/protection: None    Comment: married  Other Topics Concern  . Not on file  Social History Narrative  . Not on file    FAMILY HISTORY:  Family History  Problem Relation Age of Onset  . Cancer Brother        throat  . Cancer  Brother        prostate  . Colon cancer Neg Hx   . Liver disease Neg Hx   . Inflammatory bowel disease Neg Hx     CURRENT MEDICATIONS:  Outpatient Encounter Medications as of 03/17/2017  Medication Sig  . clonazePAM (KLONOPIN) 0.5 MG tablet   . clonazePAM (KLONOPIN) 1 MG tablet Take 1 tablet (1 mg total) by mouth 2 (two) times daily as needed  for anxiety.  Marland Kitchen dexamethasone (DECADRON) 4 MG tablet Take 2 tablets (8 mg total) by mouth daily. Start the day after chemotherapy for 2 days. Take with food.  . Diphenhyd-Hydrocort-Nystatin (FIRST-DUKES MOUTHWASH) SUSP Use as directed 5 mLs 4 (four) times daily in the mouth or throat.  . diphenoxylate-atropine (LOMOTIL) 2.5-0.025 MG tablet Take 1 tablet by mouth 4 (four) times daily as needed for diarrhea or loose stools.  Marland Kitchen dronabinol (MARINOL) 2.5 MG capsule Take 1 capsule (2.5 mg total) by mouth 2 (two) times daily before a meal.  . fluorouracil CALGB 16967 in sodium chloride 0.9 % 150 mL Inject into the vein over 96 hr.  . gabapentin (NEURONTIN) 300 MG capsule TAKE 3 CAPSULES BY MOUTH TWICE DAILY  . glimepiride (AMARYL) 2 MG tablet Take 2 mg by mouth daily with breakfast.   . HYDROcodone-acetaminophen (NORCO/VICODIN) 5-325 MG tablet Take 1-2 tablets by mouth every 6 (six) hours as needed for moderate pain.  . hydrocortisone (ANUSOL-HC) 2.5 % rectal cream Place 1 application rectally 2 (two) times daily.  Marland Kitchen leucovorin in dextrose 5 % 250 mL Inject into the vein once.  . Lidocaine 2 % GEL Apply 1 application topically 4 (four) times daily as needed.  . lidocaine-prilocaine (EMLA) cream Apply to affected area once  . lisinopril (PRINIVIL,ZESTRIL) 10 MG tablet   . mirtazapine (REMERON) 7.5 MG tablet Take 7.5 mg by mouth at bedtime.  . Nitroglycerin 0.4 % OINT Place rectally as needed.  Marland Kitchen omeprazole (PRILOSEC) 20 MG capsule Take 20 mg by mouth 2 (two) times daily before a meal.   . ondansetron (ZOFRAN) 8 MG tablet Take 1 tablet (8 mg total) by mouth 2  (two) times daily as needed for refractory nausea / vomiting. Start on day 3 after chemotherapy.  . OXALIPLATIN IV Inject into the vein.  . potassium chloride SA (K-DUR,KLOR-CON) 20 MEQ tablet Take 1 tablet (20 mEq total) by mouth 2 (two) times daily.  . prochlorperazine (COMPAZINE) 10 MG tablet Take 1 tablet (10 mg total) by mouth every 6 (six) hours as needed (Nausea or vomiting).  . psyllium (METAMUCIL) 58.6 % powder Take 1 packet by mouth daily.   . rivaroxaban (XARELTO) 20 MG TABS tablet Take 1 tablet (20 mg total) by mouth daily with supper.  . tamsulosin (FLOMAX) 0.4 MG CAPS capsule Take 0.4 mg by mouth at bedtime.   . traZODone (DESYREL) 100 MG tablet Take 100 mg by mouth at bedtime.  Marland Kitchen zinc oxide 20 % ointment Apply 1 application topically as needed for irritation.   Facility-Administered Encounter Medications as of 03/17/2017  Medication  . sodium chloride 0.9 % injection 10 mL    ALLERGIES:  Allergies  Allergen Reactions  . Oxycodone     Blisters, hallucinations  . Tramadol     Blisters, hallucinations  . Trazodone And Nefazodone Other (See Comments)    hallucinations     PHYSICAL EXAM:  ECOG Performance status: 1 - Symptomatic; remains independent      Physical Exam  Constitutional: He is oriented to person, place, and time and well-developed, well-nourished, and in no distress.  HENT:  Head: Normocephalic.  Mouth/Throat: Oropharynx is clear and moist.  Eyes: Conjunctivae are normal. No scleral icterus.  Neck: Normal range of motion. Neck supple.  Cardiovascular: Normal rate and regular rhythm.  Pulmonary/Chest: Effort normal and breath sounds normal. No respiratory distress.  Abdominal: Soft. Bowel sounds are normal. There is no tenderness.  Musculoskeletal: Normal range of motion. He exhibits no edema.  Lymphadenopathy:    He has no cervical adenopathy.       Right: No supraclavicular adenopathy present.       Left: No supraclavicular adenopathy present.    Neurological: He is alert and oriented to person, place, and time. No cranial nerve deficit.  Skin: Skin is warm and dry. No rash noted.  Psychiatric: Mood, memory, affect and judgment normal.  Nursing note and vitals reviewed.    LABORATORY DATA:  I have reviewed the labs as listed.  CBC    Component Value Date/Time   WBC 6.5 03/03/2017 0920   RBC 4.17 (L) 03/03/2017 0920   HGB 12.7 (L) 03/03/2017 0920   HGB 11.6 (L) 02/04/2011 1059   HCT 38.5 (L) 03/03/2017 0920   HCT 34.7 (L) 02/04/2011 1059   PLT 179 03/03/2017 0920   PLT 282 02/04/2011 1059   MCV 92.3 03/03/2017 0920   MCV 76.0 (L) 02/04/2011 1059   MCH 30.5 03/03/2017 0920   MCHC 33.0 03/03/2017 0920   RDW 15.1 03/03/2017 0920   RDW 18.5 (H) 02/04/2011 1059   LYMPHSABS 1.8 03/03/2017 0920   LYMPHSABS 1.1 02/04/2011 1059   MONOABS 0.5 03/03/2017 0920   MONOABS 0.5 02/04/2011 1059   EOSABS 0.1 03/03/2017 0920   EOSABS 0.1 02/04/2011 1059   BASOSABS 0.0 03/03/2017 0920   BASOSABS 0.1 02/04/2011 1059   CMP Latest Ref Rng & Units 03/03/2017 02/17/2017 02/03/2017  Glucose 65 - 99 mg/dL 213(H) 163(H) 216(H)  BUN 6 - 20 mg/dL _0 Creatinine 0.61 - 1.24 mg/dL 0.76 0.76 0.89  Sodium 135 - 145 mmol/L 137 141 136  Potassium 3.5 - 5.1 mmol/L 3.7 3.6 3.9  Chloride 101 - 111 mmol/L 106 106 106  CO2 22 - 32 mmol/L _1 Calcium 8.9 - 10.3 mg/dL 8.9 8.7(L) 8.6(L)  Total Protein 6.5 - 8.1 g/dL 6.3(L) 6.1(L) 5.9(L)  Total Bilirubin 0.3 - 1.2 mg/dL 1.0 0.8 1.2  Alkaline Phos 38 - 126 U/L 79 79 67  AST 15 - 41 U/L 28 32 28  ALT 17 - 63 U/L 26 37 22    PENDING LABS:    DIAGNOSTIC IMAGING:  *The following radiologic images and reports have been reviewed independently and agree with below findings.  CT chest/abd/pelvis: 11/06/16 CLINICAL DATA:  Patient with history of stage IV rectal carcinoma. Restaging evaluation.  EXAM: CT CHEST, ABDOMEN, AND PELVIS WITH CONTRAST  TECHNIQUE: Multidetector CT imaging of the  chest, abdomen and pelvis was performed following the standard protocol during bolus administration of intravenous contrast.  CONTRAST:  146m ISOVUE-300 IOPAMIDOL (ISOVUE-300) INJECTION 61%  COMPARISON:  CT CAP 08/26/2016.  FINDINGS: CT CHEST FINDINGS  Cardiovascular: Right anterior chest wall Port-A-Cath is present with tip terminating in the superior vena cava. The heart is normal in size. Coronary vascular calcifications. Aorta and main pulmonary artery normal in caliber.  Mediastinum/Nodes: No enlarged axillary, mediastinal or hilar lymphadenopathy. The esophagus is normal in appearance.  Lungs/Pleura: Central airways are patent. Dependent atelectasis within the bilateral lower lobes. Stable 3 mm left upper lobe nodule (image 60; series 7). Stable 7 mm right upper lobe nodule (image 75; series 7). Stable 6 mm subpleural right upper lobe nodule (image 40; series 7). Stable postsurgical changes within the right middle lobe. No pleural effusion or pneumothorax.  Musculoskeletal: Thoracic spine degenerative changes.  CT ABDOMEN PELVIS FINDINGS  Hepatobiliary: Liver is normal in size and contour. Gallbladder is unremarkable. No intrahepatic or extrahepatic biliary ductal dilatation.  Pancreas: Progressed atrophy of the pancreatic body and tail. Fullness/mass within the head of the pancreas measures 2.1 cm, similar to prior when measured at the same location (image 57; series 4).  Spleen: Unremarkable  Adrenals/Urinary Tract: Re- demonstrated calcifications within the left adrenal gland. Normal appearing right adrenal gland. Kidneys enhance symmetrically with contrast. No hydronephrosis. Urinary bladder is unremarkable. Stable 10 mm exophytic lesion off the inferior pole left kidney (image 88; series 2).  Stomach/Bowel: Persistent mild dilatation of the distal appendix measuring 8 mm with associated appendicoliths. Unchanged mild wall thickening of the  rectum. Stable appearance of the rectosigmoid anastomosis. No evidence for small bowel obstruction. Stool throughout the colon. Stomach is decompressed. Small hiatal hernia. No free fluid or free intraperitoneal air.  Vascular/Lymphatic: Normal caliber abdominal aorta. Peripheral calcified atherosclerotic plaque. Inferior vena cava filter in place. Stable 10 mm peripancreatic lymph node (image 62; series 2)  Reproductive: Dystrophic calcifications in the prostate.  Other: Stable presacral soft tissue thickening. Fat containing inguinal hernias bilaterally.  Musculoskeletal: No aggressive or acute appearing osseous lesions. Lumbar spine degenerative changes.  IMPRESSION: 1. Stable pulmonary metastatic disease. 2. Similar appearing pancreatic head mass. Progressed atrophy of the pancreatic body and tail. Stable peripancreatic lymph node. 3. Stable hypoattenuating lesion partially exophytic off the inferior pole of the left kidney. 4. Aortic atherosclerosis. 5. Persistent mild wall thickening and mild dilatation of the appendix.   Electronically Signed   By: Lovey Newcomer M.D.   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 Underwent concurrent chemoradiation, followed by surgical resection. He went on to have about 5 months of adjuvant FOLFOX chemotherapy followed by additional surgery at [facility]. He was found to be in remission after that time. Subsequent restaging PET imaging in 09/2014 revealed hypermetabolic lung lesion, which was biopsied to show adenocarcinoma consistent with colorectal primary. Underwent wedge resection with Dr. Servando Snare in 11/2014. On 06/13/16, he was noted to have several pleural based nodules representing metastatic disease, as well as pancreatic lesion  suspicion for malignancy. Pancreatic lesion was biopsied via EUS and found to be metastatic adenocarcinoma.  Treatment started at [facility] with FOLFOX/Avastin on 07/08/16.   -Last restaging CT chest/abd/pelvis showed stable disease except for mild increase in his pancreatic lesion to 2.5 cm. Plan to restage him again in 3 months, in early Jan 2019. -Check CEA level. -Oxaliplatin permanently discontinued due to worsening neuropathy with cycle 16 of treatment. -Continue chemo every 2 weeks as scheduled with 5FU and Avastin. Labs reviewed today. He can proceed with cycle 19.   Peripheral neuropathy:  -Worsened to grade 3. Discontinued oxaliplatin.  Diarrhea:  -Likely secondary to chemotherapy.  -Continue Lomotil PRN.   Dispo:  -Continue chemo every 2 weeks as scheduled.  -Return to cancer center in 4 weeks for follow-up.   All questions were answered to patient's stated satisfaction. Encouraged patient to call with any new concerns or questions before his next visit to the cancer center and we can certain see him sooner, if needed.    Twana First, MD

## 2017-03-17 NOTE — Progress Notes (Signed)
[name] tolerated chemo tx well without complaints or incident. Labs and urine protein reviewed with Dr. Talbert Cage prior to administering chemotherapy. 5FU pump infusing upon discharge without issues. VSS upon discharge. Pt discharged self ambulatory in satisfactory condition accompanied by his wife

## 2017-03-17 NOTE — Patient Instructions (Signed)
[facility] Discharge Instructions for Patients Receiving Chemotherapy   Beginning January 23rd 2017 lab work for the [facility] will be done in the  Main lab at [facility] on 1st floor. If you have a lab appointment with the [facility] please come in thru the  Main Entrance and check in at the main information desk   Today you received the following chemotherapy agents Avastin,Leucovorin and 5FU. Follow-up as scheduled. Call clinic for any questions or concerns  To help prevent nausea and vomiting after your treatment, we encourage you to take your nausea medication    If you develop nausea and vomiting, or diarrhea that is not controlled by your medication, call the clinic.  The clinic phone number is [phone]. Office hours are Monday-Friday 8:30am-5:00pm.  BELOW ARE SYMPTOMS THAT SHOULD BE REPORTED IMMEDIATELY:  *FEVER GREATER THAN 101.0 F  *CHILLS WITH OR WITHOUT FEVER  NAUSEA AND VOMITING THAT IS NOT CONTROLLED WITH YOUR NAUSEA MEDICATION  *UNUSUAL SHORTNESS OF BREATH  *UNUSUAL BRUISING OR BLEEDING  TENDERNESS IN MOUTH AND THROAT WITH OR WITHOUT PRESENCE OF ULCERS  *URINARY PROBLEMS  *BOWEL PROBLEMS  UNUSUAL RASH Items with * indicate a potential emergency and should be followed up as soon as possible. If you have an emergency after office hours please contact your primary care physician or go to the nearest emergency department.  Please call the clinic during office hours if you have any questions or concerns.   You may also contact the Patient Navigator at [phone] should you have any questions or need assistance in obtaining follow up care.      Resources For Cancer Patients and their Caregivers ? American Cancer Society: Can assist with transportation, wigs, general needs, runs Look Good Feel Better.        216-030-5038 ? Cancer Care: Provides financial assistance, online support groups, medication/co-pay assistance.   1-800-813-HOPE (732) 799-9634) ? [place] Assists [place] cancer patients and their families through emotional , educational and financial support.  (724) 606-6456 ? [place] DSS Where to apply for food stamps, Medicaid and utility assistance. 208-587-0670 ? RCATS: Transportation to medical appointments. (773)076-6120 ? Social Security Administration: May apply for disability if have a Stage IV cancer. 218-284-5722 239-312-8738 ? [employer], Disability and Transit Services: Assists with nutrition, care and transit needs. [phone]

## 2017-03-19 ENCOUNTER — Encounter (HOSPITAL_BASED_OUTPATIENT_CLINIC_OR_DEPARTMENT_OTHER): Payer: Medicare Other

## 2017-03-19 ENCOUNTER — Encounter (HOSPITAL_COMMUNITY): Payer: Self-pay

## 2017-03-19 VITALS — BP 159/83 | HR 86 | Temp 98.1°F | Resp 18

## 2017-03-19 DIAGNOSIS — C20 Malignant neoplasm of rectum: Secondary | ICD-10-CM

## 2017-03-19 MED ORDER — SODIUM CHLORIDE 0.9% FLUSH
10.0000 mL | INTRAVENOUS | Status: DC | PRN
  Administered 2017-03-19: 10 mL
  Filled 2017-03-19: qty 10

## 2017-03-19 MED ORDER — HEPARIN SOD (PORK) LOCK FLUSH 100 UNIT/ML IV SOLN
500.0000 [IU] | Freq: Once | INTRAVENOUS | Status: AC | PRN
  Administered 2017-03-19: 500 [IU]

## 2017-03-19 NOTE — Progress Notes (Signed)
Charles [name] tolerated 5FU pump discontinuation well without complaints or incident. After pump removed portacath flushed with 10 ml NS and 5 ml Heparin easily per protocol then de-accessed. VSS Pt discharged self ambulatory in satisfactory condition accompanied by his wife

## 2017-03-19 NOTE — Patient Instructions (Signed)
[place] at [facility] Discharge Instructions  RECOMMENDATIONS MADE BY THE CONSULTANT AND ANY TEST RESULTS WILL BE SENT TO YOUR REFERRING PHYSICIAN.  5FU pump discontinued today with portacath flushed per protocol. Follow-up as scheduled. Call clinic for any questions or concerns  Thank you for choosing [place] at [facility] to provide your oncology and hematology care.  To afford each patient quality time with our provider, please arrive at least 15 minutes before your scheduled appointment time.    If you have a lab appointment with the [facility] please come in thru the  Main Entrance and check in at the main information desk  You need to re-schedule your appointment should you arrive 10 or more minutes late.  We strive to give you quality time with our providers, and arriving late affects you and other patients whose appointments are after yours.  Also, if you no show three or more times for appointments you may be dismissed from the clinic at the providers discretion.     Again, thank you for choosing [facility].  Our hope is that these requests will decrease the amount of time that you wait before being seen by our physicians.       _____________________________________________________________  Should you have questions after your visit to [facility], please contact our office at [phone] between the hours of 8:30 a.m. and 4:30 p.m.  Voicemails left after 4:30 p.m. will not be returned until the following business day.  For prescription refill requests, have your pharmacy contact our office.       Resources For Cancer Patients and their Caregivers ? American Cancer Society: Can assist with transportation, wigs, general needs, runs Look Good Feel Better.        ([phone] ? Cancer Care: Provides financial assistance, online support groups, medication/co-pay assistance.  1-800-813-HOPE  252-478-3085) ? [place] Assists [place] cancer patients and their families through emotional , educational and financial support.  724-617-3282 ? [place] DSS Where to apply for food stamps, Medicaid and utility assistance. 401-203-6115 ? RCATS: Transportation to medical appointments. 604-201-4839 ? Social Security Administration: May apply for disability if have a Stage IV cancer. 640-226-5918 440-151-8341 ? [employer], Disability and Transit Services: Assists with nutrition, care and transit needs. [place] Support Programs: @10RELATIVEDAYS @ > Cancer Support Group  2nd Tuesday of the month 1pm-2pm, Journey Room  > Creative Journey  3rd Tuesday of the month 1130am-1pm, Journey Room  > Look Good Feel Better  1st Wednesday of the month 10am-12 noon, Journey Room (Call [facility] to register [phone])

## 2017-03-31 ENCOUNTER — Ambulatory Visit (HOSPITAL_COMMUNITY): Payer: Medicare Other

## 2017-03-31 ENCOUNTER — Encounter (HOSPITAL_COMMUNITY): Payer: Self-pay | Admitting: Emergency Medicine

## 2017-03-31 ENCOUNTER — Emergency Department (HOSPITAL_COMMUNITY)
Admission: EM | Admit: 2017-03-31 | Discharge: 2017-03-31 | Disposition: A | Discharge status: Home / Self Care | Payer: Medicare Other | Attending: Emergency Medicine | Admitting: Emergency Medicine

## 2017-03-31 ENCOUNTER — Emergency Department (HOSPITAL_COMMUNITY): Payer: Medicare Other

## 2017-03-31 ENCOUNTER — Telehealth (HOSPITAL_COMMUNITY): Payer: Self-pay

## 2017-03-31 DIAGNOSIS — E119 Type 2 diabetes mellitus without complications: Secondary | ICD-10-CM | POA: Diagnosis not present

## 2017-03-31 DIAGNOSIS — R059 Cough, unspecified: Secondary | ICD-10-CM

## 2017-03-31 DIAGNOSIS — R05 Cough: Secondary | ICD-10-CM | POA: Diagnosis not present

## 2017-03-31 DIAGNOSIS — I10 Essential (primary) hypertension: Secondary | ICD-10-CM | POA: Diagnosis not present

## 2017-03-31 DIAGNOSIS — Z79899 Other long term (current) drug therapy: Secondary | ICD-10-CM | POA: Diagnosis not present

## 2017-03-31 DIAGNOSIS — R509 Fever, unspecified: Secondary | ICD-10-CM | POA: Diagnosis present

## 2017-03-31 DIAGNOSIS — Z85038 Personal history of other malignant neoplasm of large intestine: Secondary | ICD-10-CM | POA: Diagnosis not present

## 2017-03-31 DIAGNOSIS — Z85118 Personal history of other malignant neoplasm of bronchus and lung: Secondary | ICD-10-CM | POA: Diagnosis not present

## 2017-03-31 DIAGNOSIS — Z86718 Personal history of other venous thrombosis and embolism: Secondary | ICD-10-CM | POA: Diagnosis not present

## 2017-03-31 LAB — CBC WITH DIFFERENTIAL/PLATELET
Basophils Absolute: 0 10*3/uL (ref 0.0–0.1)
Basophils Relative: 0 %
Eosinophils Absolute: 0.1 10*3/uL (ref 0.0–0.7)
Eosinophils Relative: 2 %
HCT: 39 % (ref 39.0–52.0)
Hemoglobin: 12.4 g/dL — ABNORMAL LOW (ref 13.0–17.0)
Lymphocytes Relative: 25 %
Lymphs Abs: 1.5 10*3/uL (ref 0.7–4.0)
MCH: 30 pg (ref 26.0–34.0)
MCHC: 31.8 g/dL (ref 30.0–36.0)
MCV: 94.4 fL (ref 78.0–100.0)
Monocytes Absolute: 0.8 10*3/uL (ref 0.1–1.0)
Monocytes Relative: 13 %
Neutro Abs: 3.6 10*3/uL (ref 1.7–7.7)
Neutrophils Relative %: 60 %
Platelets: 177 10*3/uL (ref 150–400)
RBC: 4.13 MIL/uL — ABNORMAL LOW (ref 4.22–5.81)
RDW: 15 % (ref 11.5–15.5)
WBC: 6 10*3/uL (ref 4.0–10.5)

## 2017-03-31 LAB — BASIC METABOLIC PANEL
Anion gap: 9 (ref 5–15)
BUN: 8 mg/dL (ref 6–20)
CO2: 24 mmol/L (ref 22–32)
Calcium: 8.8 mg/dL — ABNORMAL LOW (ref 8.9–10.3)
Chloride: 107 mmol/L (ref 101–111)
Creatinine, Ser: 0.8 mg/dL (ref 0.61–1.24)
GFR calc Af Amer: >60 mL/min (ref >60)
GFR calc non Af Amer: >60 mL/min (ref >60)
Glucose, Bld: 91 mg/dL (ref 65–99)
Potassium: 4 mmol/L (ref 3.5–5.1)
Sodium: 140 mmol/L (ref 135–145)

## 2017-03-31 MED ORDER — LEVOFLOXACIN 500 MG PO TABS: 500.0000 mg | ORAL_TABLET | Freq: Every day | ORAL | 0 refills | Status: DC

## 2017-03-31 NOTE — ED Provider Notes (Signed)
[facility] EMERGENCY DEPARTMENT Provider Note   CSN: [id number] Arrival date & time: 03/31/17  0848     History   Chief Complaint Chief Complaint  Patient presents with  . Fever    HPI Charles [name] is a 66 y.o. male.  Patient complains of cough and congestion mild fever.  Patient has colon cancer and was supposed to get chemotherapy today   The history is provided by the patient. No language interpreter was used.  Fever   This is a new problem. The current episode started 2 days ago. The problem occurs constantly. The problem has not changed since onset.His temperature was unmeasured prior to arrival. Pertinent negatives include no chest pain, no diarrhea, no congestion, no headaches and no cough. He has tried nothing for the symptoms.    Past Medical History:  Diagnosis Date  . Chronic anticoagulation   . Colon cancer ([place]) 07/24/2010   rectal ca, inv adenocarcinoma  . Depression 04/01/2011  . Diabetes mellitus without complication ([place])   . DVT (deep venous thrombosis) ([place]) 05/09/2011  . GERD (gastroesophageal reflux disease)   . High output ileostomy ([place]) 05/21/2011  . History of kidney stones   . HTN (hypertension)   . Hx of radiation therapy 09/02/10 to 10/14/10   pelvis  . Lung metastasis ([place])   . Neuropathy   . Peripheral vascular disease ([place])    dvt's,pe  . Pneumonia    hx x3  . Pulmonary embolism ([place])   . Rectal cancer ([place]) 01/15/2011   S/P radiation and concurrent 5-FU continuous infusion from 09/09/10- 10/10/10.  S/P proctectomy with colorectal anastomosis and diverting loop ileostomy on 11/14/10 at [facility] by Dr. Harlon Ditty. Pathology reveals a pT3b N1 with 3/20 lymph nodes.      Patient Active Problem List   Diagnosis Date Noted  . Community acquired pneumonia of right lower lobe of lung ([place]) 07/23/2016  . Abnormal pancreas function test   . Rectal pain 05/30/2016  . Loss of weight 05/30/2016  . Hiatal hernia   . Schatzki's ring   . GI  bleed 11/23/2014  . Chronic anticoagulation 11/23/2014  . Constipation 11/23/2014  . Heme positive stool 11/23/2014  . Gastritis 11/23/2014  . S/P thoracotomy 11/23/2014  . History of rectal cancer 11/23/2014  . S/P partial lobectomy of lung 11/15/2014  . DVT (deep venous thrombosis) ([place])   . Lung nodule   . Lung nodule, solitary   . Acute pulmonary embolism ([place]) 09/08/2014  . Lactic acidosis 09/08/2014  . Lower abdominal pain 09/08/2014  . Urinary retention 09/08/2014  . Diarrhea 08/01/2014  . 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 Stiffness of joint, not elsewhere classified, ankle and foot 01/31/2014  . Weakness of both legs 01/31/2014  . Hx of radiation therapy   . Rectal cancer ([place]) 01/15/2011  . Colon cancer ([place]) 07/24/2010  . GERD 07/11/2010    Past Surgical History:  Procedure Laterality Date  . COLON SURGERY  11/14/2010   proctectomy with colorectal anastomosis and diverting loop ileostomy (temporary planned)  . COLONOSCOPY  07/2010   proximal rectal apple core mass 10-14cm from anal verge (adenocarcinoma), 2-3cm distal rectal carpet polyp s/p piecemeal snare polypectomy (adenoma)  . COLONOSCOPY  04/21/2012   RMR: Friable,fibrotic appearing colorectal anastomosis producing some luminal narrowing-not felt to be critical. path: focal erosion with slight inflammation and hyperemia. SURVEILLANCE DUE DEC 2015  . COLONOSCOPY N/A 11/24/2013   Dr. Rourk:somewhat fibrotic/friable anastomotic mucosal-status post biopsy (narrowing not  felt to be clinically significant) Single colonic diverticulum. benign polypoid rectal mucosa  . colostomy reversal  april 2013  . ESOPHAGOGASTRODUODENOSCOPY  07/2010   RMR: schatki ring s/p dilation, small hh, SB bx benign  . ESOPHAGOGASTRODUODENOSCOPY N/A 09/04/2015   Procedure: ESOPHAGOGASTRODUODENOSCOPY (EGD);  Surgeon: Daneil Dolin, MD;  Location: [facility] ENDO SUITE;  Service: Endoscopy;  Laterality: N/A;  730  . EUS   08/2010   Dr. Owens Loffler. uT3N0 circumferential, nearly obstruction rectosigmoid adenocarcinoma, distal edge 12cm from anal verge  . EUS N/A 06/19/2016   Procedure: UPPER ENDOSCOPIC ULTRASOUND (EUS) RADIAL;  Surgeon: Milus Banister, MD;  Location: [facility] ENDOSCOPY;  Service: Endoscopy;  Laterality: N/A;  . HERNIA REPAIR     abd hernia repair  . IVC filter    . ivc filter    . port a cath placement    . PORT-A-CATH REMOVAL  09/24/2011   Procedure: REMOVAL PORT-A-CATH;  Surgeon: Donato Heinz, MD;  Location: [facility] ORS;  Service: General;  Laterality: N/A;  Minor Room  . PORTACATH PLACEMENT Right 07/07/2016   Procedure: INSERTION PORT-A-CATH;  Surgeon: Aviva Signs, MD;  Location: [facility] ORS;  Service: General;  Laterality: Right;  [facility] Kitchen VIDEO ASSISTED THORACOSCOPY (VATS)/WEDGE RESECTION Right 11/15/2014   Procedure: VIDEO ASSISTED THORACOSCOPY (VATS)/LUNG RESECTION WITH RIGHT LINGULECTOMY;  Surgeon: [name], MD;  Location: [facility];  Service: Thoracic;  Laterality: Right;  Marland Kitchen VIDEO BRONCHOSCOPY N/A 11/15/2014   Procedure: VIDEO BRONCHOSCOPY;  Surgeon: [name], MD;  Location: [facility] OR;  Service: Thoracic;  Laterality: N/A;       Home Medications    Prior to Admission medications   Medication Sig Start Date End Date Taking? Authorizing Provider  acetaminophen (TYLENOL) 500 MG tablet Take 1,000 mg by mouth every 6 (six) hours as needed.   Yes [provider]  clonazePAM (KLONOPIN) 1 MG tablet Take 1 tablet (1 mg total) by mouth 2 (two) times daily as needed for anxiety. 09/02/16  Yes Twana First, MD  dexamethasone (DECADRON) 4 MG tablet Take 2 tablets (8 mg total) by mouth daily. Start the day after chemotherapy for 2 days. Take with food. 01/20/17  Yes Twana First, MD  Diphenhyd-Hydrocort-Nystatin (FIRST-DUKES MOUTHWASH) SUSP Use as directed 5 mLs 4 (four) times daily in the mouth or throat. 03/11/17  Yes Twana First, MD  dronabinol (MARINOL) 2.5 MG capsule Take 1 capsule (2.5 mg  total) by mouth 2 (two) times daily before a meal. 02/17/17  Yes Twana First, MD  fluorouracil CALGB 69678 in sodium chloride 0.9 % 150 mL Inject into the vein over 96 hr.   Yes [provider]  gabapentin (NEURONTIN) 300 MG capsule TAKE 3 CAPSULES BY MOUTH TWICE DAILY 01/27/17  Yes Twana First, MD  glimepiride (AMARYL) 2 MG tablet Take 2 mg by mouth daily with breakfast.    Yes [provider]  HYDROcodone-acetaminophen (NORCO/VICODIN) 5-325 MG tablet Take 1-2 tablets by mouth every 6 (six) hours as needed for moderate pain. 02/17/17  Yes Twana First, MD  leucovorin in dextrose 5 % 250 mL Inject into the vein once.   Yes [provider]  lisinopril (PRINIVIL,ZESTRIL) 10 MG tablet  12/30/16  Yes [provider]  mirtazapine (REMERON) 7.5 MG tablet Take 7.5 mg by mouth at bedtime.   Yes [provider]  omeprazole (PRILOSEC) 20 MG capsule Take 20 mg by mouth 2 (two) times daily before a meal.    Yes [provider]  ondansetron (ZOFRAN) 8 MG  tablet Take 1 tablet (8 mg total) by mouth 2 (two) times daily as needed for refractory nausea / vomiting. Start on day 3 after chemotherapy. 01/20/17  Yes Twana First, MD  OXALIPLATIN IV Inject into the vein.   Yes [provider]  potassium chloride SA (K-DUR,KLOR-CON) 20 MEQ tablet Take 1 tablet (20 mEq total) by mouth 2 (two) times daily. 01/06/17  Yes Holley Bouche, NP  prochlorperazine (COMPAZINE) 10 MG tablet Take 1 tablet (10 mg total) by mouth every 6 (six) hours as needed (Nausea or vomiting). 01/20/17  Yes Twana First, MD  psyllium (REGULOID) 0.52 g capsule Take 0.52 g by mouth daily.   Yes [provider]  rivaroxaban (XARELTO) 20 MG TABS tablet Take 1 tablet (20 mg total) by mouth daily with supper. 11/18/14  Yes Barrett, Erin R, PA-C  tamsulosin (FLOMAX) 0.4 MG CAPS capsule Take 0.4 mg by mouth at bedtime.    Yes [provider]  traZODone (DESYREL) 100 MG tablet Take  100 mg by mouth at bedtime.   Yes [provider]  zinc oxide 20 % ointment Apply 1 application topically as needed for irritation. 07/14/16  Yes Kefalas, Manon Hilding, PA-C  diphenoxylate-atropine (LOMOTIL) 2.5-0.025 MG tablet Take 1 tablet by mouth 4 (four) times daily as needed for diarrhea or loose stools. Patient not taking: Reported on 03/31/2017 02/17/17   Twana First, MD  levofloxacin (LEVAQUIN) 500 MG tablet Take 1 tablet (500 mg total) by mouth daily. 03/31/17   Keishon Ferguson, MD    Family History Family History  Problem Relation Age of Onset  . Cancer Brother        throat  . Cancer Brother        prostate  . Colon cancer Neg Hx   . Liver disease Neg Hx   . Inflammatory bowel disease Neg Hx     Social History Social History   Tobacco Use  . Smoking status: Never Smoker  . Smokeless tobacco: Never Used  Substance Use Topics  . Alcohol use: No  . Drug use: No     Allergies   Oxycodone; Tramadol; and Trazodone and nefazodone   Review of Systems Review of Systems  Constitutional: Positive for fever. Negative for appetite change and fatigue.  HENT: Negative for congestion, ear discharge and sinus pressure.   Eyes: Negative for discharge.  Respiratory: Negative for cough.   Cardiovascular: Negative for chest pain.  Gastrointestinal: Negative for abdominal pain and diarrhea.  Genitourinary: Negative for frequency and hematuria.  Musculoskeletal: Negative for back pain.  Skin: Negative for rash.  Neurological: Negative for seizures and headaches.  Psychiatric/Behavioral: Negative for hallucinations.     Physical Exam Updated Vital Signs BP 131/87 (BP Location: Right Arm)   Pulse 96   Temp 98.7 F (37.1 C)   Resp 18   Wt 93.4 kg (206 lb)   SpO2 98%   BMI 26.45 kg/m   Physical Exam  Constitutional: He is oriented to person, place, and time. He appears well-developed.  HENT:  Head: Normocephalic.  Eyes: Conjunctivae and EOM are normal. No  scleral icterus.  Neck: Neck supple. No thyromegaly present.  Cardiovascular: Normal rate and regular rhythm. Exam reveals no gallop and no friction rub.  No murmur heard. Pulmonary/Chest: No stridor. He has no wheezes. He has no rales. He exhibits no tenderness.  Abdominal: He exhibits no distension. There is no tenderness. There is no rebound.  Musculoskeletal: Normal range of motion. He exhibits no edema.  Lymphadenopathy:    He has no cervical adenopathy.  Neurological: He is oriented to person, place, and time. He exhibits normal muscle tone. Coordination normal.  Skin: No rash noted. No erythema.  Psychiatric: He has a normal mood and affect. His behavior is normal.     ED Treatments / Results  Labs (all labs ordered are listed, but only abnormal results are displayed) Labs Reviewed  CBC WITH DIFFERENTIAL/PLATELET - Abnormal; Notable for the following components:      Result Value   RBC 4.13 (*)    Hemoglobin 12.4 (*)    All other components within normal limits  BASIC METABOLIC PANEL - Abnormal; Notable for the following components:   Calcium 8.8 (*)    All other components within normal limits    EKG  EKG Interpretation None       Radiology Dg Chest 2 View  Result Date: 03/31/2017 CLINICAL DATA:  Cough, fever, history of lung and pancreatic carcinoma as well as rectal carcinoma EXAM: CHEST  2 VIEW COMPARISON:  CT chest of 01/30/2017 and chest x-ray of 07/23/2016 FINDINGS: Linear scarring at the right lung base is unchanged. No pneumonia or effusion is seen. Right-sided Port-A-Cath tip overlies the mid SVC. Mediastinal and hilar contours are unremarkable, and the heart is within normal limits in size. There are degenerative changes throughout the thoracic spine. There is a sclerotic focus within T5 vertebral body on the lateral view of questionable significance. A metastatic lesion cannot be excluded. IMPRESSION: 1. Stable scarring at the right lung base. No definite  active process. 2. Sclerotic focus within T-5 vertebral body on the lateral view. Question metastatic lesion. Electronically Signed   By: Ivar Drape M.D.   On: 03/31/2017 10:12    Procedures Procedures (including critical care time)  Medications Ordered in ED Medications - No data to display   Initial Impression / Assessment and Plan / ED Course  I have reviewed the triage vital signs and the nursing notes.  Pertinent labs & imaging results that were available during my care of the patient were reviewed by me and considered in my medical decision making (see chart for details).     Patient with cough and congestion respiratory infection.  Chest x-ray does not show pneumonia but possible lesion at T4.  I spoke with his oncologist and will place him on Levaquin and and he will be followed back up there for continued chemotherapy  Final Clinical Impressions(s) / ED Diagnoses   Final diagnoses:  Cough    ED Discharge Orders        Ordered    levofloxacin (LEVAQUIN) 500 MG tablet  Daily     03/31/17 1216       Brigham Ferguson, MD 03/31/17 1219

## 2017-03-31 NOTE — Telephone Encounter (Signed)
Patient's wife had called and left message that he was really sick. He is for treatment today. I called her back, she is reported that patient has not felt good since Sunday, running low grade fevers since yesterday,100.5. She has been giving him tylenol. Also reporting that patient has had a tight feeling in chest with a cough. Consulted with Mike Craze NP, will instruct patient to go the Emergency Room and get evaluated per NP. No treatment today due to fever per NP. Wife understood and they are on the way to the ER.

## 2017-03-31 NOTE — ED Triage Notes (Addendum)
Pt sent here from cancer center, due to fever and cough, wanting chest xray.

## 2017-03-31 NOTE — ED Notes (Signed)
Amy from Cancer center called with new appt time for Pt.  Appt. is Dec. 4th at 8:15. Pt given the time and EDP informed.

## 2017-03-31 NOTE — Discharge Instructions (Signed)
Your oncologist will contact you and let you know when the next chemotherapy will be scheduled

## 2017-04-02 ENCOUNTER — Encounter (HOSPITAL_COMMUNITY): Payer: Medicare Other

## 2017-04-02 DIAGNOSIS — Z6824 Body mass index (BMI) 24.0-24.9, adult: Secondary | ICD-10-CM | POA: Diagnosis not present

## 2017-04-02 DIAGNOSIS — Z299 Encounter for prophylactic measures, unspecified: Secondary | ICD-10-CM | POA: Diagnosis not present

## 2017-04-02 DIAGNOSIS — J069 Acute upper respiratory infection, unspecified: Secondary | ICD-10-CM | POA: Diagnosis not present

## 2017-04-02 DIAGNOSIS — G629 Polyneuropathy, unspecified: Secondary | ICD-10-CM | POA: Diagnosis not present

## 2017-04-02 DIAGNOSIS — C7800 Secondary malignant neoplasm of unspecified lung: Secondary | ICD-10-CM | POA: Diagnosis not present

## 2017-04-02 DIAGNOSIS — C20 Malignant neoplasm of rectum: Secondary | ICD-10-CM | POA: Diagnosis not present

## 2017-04-02 DIAGNOSIS — E1165 Type 2 diabetes mellitus with hyperglycemia: Secondary | ICD-10-CM | POA: Diagnosis not present

## 2017-04-07 ENCOUNTER — Encounter (HOSPITAL_COMMUNITY): Payer: Medicare Other | Attending: Oncology

## 2017-04-07 ENCOUNTER — Encounter (HOSPITAL_COMMUNITY): Payer: Self-pay

## 2017-04-07 VITALS — BP 115/67 | HR 70 | Temp 98.0°F | Resp 18 | Wt 206.4 lb

## 2017-04-07 DIAGNOSIS — C7801 Secondary malignant neoplasm of right lung: Secondary | ICD-10-CM | POA: Diagnosis not present

## 2017-04-07 DIAGNOSIS — C7889 Secondary malignant neoplasm of other digestive organs: Secondary | ICD-10-CM

## 2017-04-07 DIAGNOSIS — C20 Malignant neoplasm of rectum: Secondary | ICD-10-CM | POA: Diagnosis not present

## 2017-04-07 DIAGNOSIS — Z5112 Encounter for antineoplastic immunotherapy: Secondary | ICD-10-CM

## 2017-04-07 LAB — CBC WITH DIFFERENTIAL/PLATELET
Basophils Absolute: 0 10*3/uL (ref 0.0–0.1)
Basophils Relative: 0 %
Eosinophils Absolute: 0.2 10*3/uL (ref 0.0–0.7)
Eosinophils Relative: 3 %
HCT: 38.1 % — ABNORMAL LOW (ref 39.0–52.0)
Hemoglobin: 12 g/dL — ABNORMAL LOW (ref 13.0–17.0)
Lymphocytes Relative: 25 %
Lymphs Abs: 1.7 10*3/uL (ref 0.7–4.0)
MCH: 29.7 pg (ref 26.0–34.0)
MCHC: 31.5 g/dL (ref 30.0–36.0)
MCV: 94.3 fL (ref 78.0–100.0)
Monocytes Absolute: 0.6 10*3/uL (ref 0.1–1.0)
Monocytes Relative: 9 %
Neutro Abs: 4.2 10*3/uL (ref 1.7–7.7)
Neutrophils Relative %: 63 %
Platelets: 238 10*3/uL (ref 150–400)
RBC: 4.04 MIL/uL — ABNORMAL LOW (ref 4.22–5.81)
RDW: 14.6 % (ref 11.5–15.5)
WBC: 6.7 10*3/uL (ref 4.0–10.5)

## 2017-04-07 LAB — COMPREHENSIVE METABOLIC PANEL
ALT: 19 U/L (ref 17–63)
AST: 25 U/L (ref 15–41)
Albumin: 3.1 g/dL — ABNORMAL LOW (ref 3.5–5.0)
Alkaline Phosphatase: 83 U/L (ref 38–126)
Anion gap: 8 (ref 5–15)
BUN: 11 mg/dL (ref 6–20)
CO2: 23 mmol/L (ref 22–32)
Calcium: 8.7 mg/dL — ABNORMAL LOW (ref 8.9–10.3)
Chloride: 109 mmol/L (ref 101–111)
Creatinine, Ser: 0.7 mg/dL (ref 0.61–1.24)
GFR calc Af Amer: >60 mL/min (ref >60)
GFR calc non Af Amer: >60 mL/min (ref >60)
Glucose, Bld: 201 mg/dL — ABNORMAL HIGH (ref 65–99)
Potassium: 3.8 mmol/L (ref 3.5–5.1)
Sodium: 140 mmol/L (ref 135–145)
Total Bilirubin: 0.6 mg/dL (ref 0.3–1.2)
Total Protein: 5.8 g/dL — ABNORMAL LOW (ref 6.5–8.1)

## 2017-04-07 LAB — URINALYSIS, DIPSTICK ONLY
Bilirubin Urine: NEGATIVE
Glucose, UA: NEGATIVE mg/dL
Hgb urine dipstick: NEGATIVE
Ketones, ur: NEGATIVE mg/dL
Leukocytes, UA: NEGATIVE
Nitrite: NEGATIVE
Protein, ur: NEGATIVE mg/dL
Specific Gravity, Urine: >1.03 — ABNORMAL HIGH (ref 1.005–1.030)
pH: 5.5 (ref 5.0–8.0)

## 2017-04-07 MED ORDER — SODIUM CHLORIDE 0.9 % IV SOLN
5.6000 mg/kg | Freq: Once | INTRAVENOUS | Status: AC
  Administered 2017-04-07: 500 mg via INTRAVENOUS
  Filled 2017-04-07: qty 16

## 2017-04-07 MED ORDER — DIPHENHYDRAMINE HCL 50 MG/ML IJ SOLN
50.0000 mg | Freq: Once | INTRAMUSCULAR | Status: AC
  Administered 2017-04-07: 50 mg via INTRAVENOUS
  Filled 2017-04-07: qty 1

## 2017-04-07 MED ORDER — FLUOROURACIL CHEMO INJECTION 5 GM/100ML
1800.0000 mg/m2 | INTRAVENOUS | Status: DC
  Administered 2017-04-07: 3850 mg via INTRAVENOUS
  Filled 2017-04-07: qty 77

## 2017-04-07 MED ORDER — SODIUM CHLORIDE 0.9% FLUSH
10.0000 mL | INTRAVENOUS | Status: DC | PRN
  Administered 2017-04-07: 10 mL
  Filled 2017-04-07: qty 10

## 2017-04-07 MED ORDER — DEXTROSE 5 % IV SOLN: Freq: Once | INTRAVENOUS | Status: DC

## 2017-04-07 MED ORDER — PALONOSETRON HCL INJECTION 0.25 MG/5ML
0.2500 mg | Freq: Once | INTRAVENOUS | Status: AC
  Administered 2017-04-07: 0.25 mg via INTRAVENOUS
  Filled 2017-04-07: qty 5

## 2017-04-07 MED ORDER — SODIUM CHLORIDE 0.9 % IV SOLN
INTRAVENOUS | Status: DC
  Administered 2017-04-07: 09:00:00 via INTRAVENOUS

## 2017-04-07 MED ORDER — FLUOROURACIL CHEMO INJECTION 2.5 GM/50ML
300.0000 mg/m2 | Freq: Once | INTRAVENOUS | Status: AC
  Administered 2017-04-07: 650 mg via INTRAVENOUS
  Filled 2017-04-07: qty 13

## 2017-04-07 MED ORDER — DEXAMETHASONE SODIUM PHOSPHATE 10 MG/ML IJ SOLN
10.0000 mg | Freq: Once | INTRAMUSCULAR | Status: AC
  Administered 2017-04-07: 10 mg via INTRAVENOUS
  Filled 2017-04-07: qty 1

## 2017-04-07 MED ORDER — LEUCOVORIN CALCIUM INJECTION 350 MG
650.0000 mg | Freq: Once | INTRAMUSCULAR | Status: AC
  Administered 2017-04-07: 650 mg via INTRAVENOUS
  Filled 2017-04-07: qty 32.5

## 2017-04-07 NOTE — Progress Notes (Signed)
Charles [name] tolerated chemo tx well without complaints or incident. Labs and urine protein reviewed with Dr. Talbert Cage prior to administering chemotherapy. Pt discharged with 5FU pump infusing without issues. VSS upon discharge. Pt discharged self ambulatory in satisfactory condition accompanied by his wife

## 2017-04-07 NOTE — Patient Instructions (Signed)
[facility] Discharge Instructions for Patients Receiving Chemotherapy   Beginning January 23rd 2017 lab work for the [facility] will be done in the  Main lab at [facility] on 1st floor. If you have a lab appointment with the [facility] please come in thru the  Main Entrance and check in at the main information desk   Today you received the following chemotherapy agents Avastin,Leucovorin and 5FU. Follow-up as scheduled. Call clinic for any questions or concerns  To help prevent nausea and vomiting after your treatment, we encourage you to take your nausea medication.   If you develop nausea and vomiting, or diarrhea that is not controlled by your medication, call the clinic.  The clinic phone number is [phone]. Office hours are Monday-Friday 8:30am-5:00pm.  BELOW ARE SYMPTOMS THAT SHOULD BE REPORTED IMMEDIATELY:  *FEVER GREATER THAN 101.0 F  *CHILLS WITH OR WITHOUT FEVER  NAUSEA AND VOMITING THAT IS NOT CONTROLLED WITH YOUR NAUSEA MEDICATION  *UNUSUAL SHORTNESS OF BREATH  *UNUSUAL BRUISING OR BLEEDING  TENDERNESS IN MOUTH AND THROAT WITH OR WITHOUT PRESENCE OF ULCERS  *URINARY PROBLEMS  *BOWEL PROBLEMS  UNUSUAL RASH Items with * indicate a potential emergency and should be followed up as soon as possible. If you have an emergency after office hours please contact your primary care physician or go to the nearest emergency department.  Please call the clinic during office hours if you have any questions or concerns.   You may also contact the Patient Navigator at [phone] should you have any questions or need assistance in obtaining follow up care.      Resources For Cancer Patients and their Caregivers ? American Cancer Society: Can assist with transportation, wigs, general needs, runs Look Good Feel Better.        4402500446 ? Cancer Care: Provides financial assistance, online support groups, medication/co-pay assistance.   1-800-813-HOPE 409-697-7497) ? [place] Assists [place] cancer patients and their families through emotional , educational and financial support.  ([phone] ? [place] DSS Where to apply for food stamps, Medicaid and utility assistance. 330-201-7674 ? [employer]: Transportation to medical appointments. 7020088176 ? Social Security Administration: May apply for disability if have a Stage IV cancer. [phone] ? [employer], Disability and Transit Services: Assists with nutrition, care and transit needs. [phone]

## 2017-04-09 ENCOUNTER — Encounter (HOSPITAL_COMMUNITY): Payer: Self-pay

## 2017-04-09 ENCOUNTER — Other Ambulatory Visit: Payer: Self-pay

## 2017-04-09 ENCOUNTER — Encounter (HOSPITAL_BASED_OUTPATIENT_CLINIC_OR_DEPARTMENT_OTHER): Payer: Medicare Other

## 2017-04-09 VITALS — BP 129/80 | HR 95 | Temp 97.7°F | Resp 20

## 2017-04-09 DIAGNOSIS — C20 Malignant neoplasm of rectum: Secondary | ICD-10-CM

## 2017-04-09 DIAGNOSIS — Z452 Encounter for adjustment and management of vascular access device: Secondary | ICD-10-CM

## 2017-04-09 DIAGNOSIS — C7889 Secondary malignant neoplasm of other digestive organs: Secondary | ICD-10-CM

## 2017-04-09 DIAGNOSIS — C7801 Secondary malignant neoplasm of right lung: Secondary | ICD-10-CM | POA: Diagnosis not present

## 2017-04-09 MED ORDER — HEPARIN SOD (PORK) LOCK FLUSH 100 UNIT/ML IV SOLN
500.0000 [IU] | Freq: Once | INTRAVENOUS | Status: AC | PRN
  Administered 2017-04-09: 500 [IU]
  Filled 2017-04-09: qty 5

## 2017-04-09 MED ORDER — SODIUM CHLORIDE 0.9% FLUSH
10.0000 mL | INTRAVENOUS | Status: DC | PRN
  Administered 2017-04-09: 10 mL
  Filled 2017-04-09: qty 10

## 2017-04-09 NOTE — Progress Notes (Signed)
Charles [name] returns today for port de access and flush after 46 hr continous infusion of 20fu. Tolerated infusion without problems. Portacath located right chest wall was  deaccessed and flushed with 80ml NS and 500U/19ml Heparin and needle removed intact.  Procedure without incident. Patient tolerated procedure well.  Treatment given per orders. Patient tolerated it well without problems. Vitals stable and discharged home from clinic ambulatory. Follow up as scheduled.

## 2017-04-09 NOTE — Patient Instructions (Signed)
[place] at [facility] Discharge Instructions  RECOMMENDATIONS MADE BY THE CONSULTANT AND ANY TEST RESULTS WILL BE SENT TO YOUR REFERRING PHYSICIAN.  Disconnected continuous 5FU pump today.  Follow up as scheduled.  Thank you for choosing [place] at [facility] to provide your oncology and hematology care.  To afford each patient quality time with our provider, please arrive at least 15 minutes before your scheduled appointment time.    If you have a lab appointment with the [facility] please come in thru the  Main Entrance and check in at the main information desk  You need to re-schedule your appointment should you arrive 10 or more minutes late.  We strive to give you quality time with our providers, and arriving late affects you and other patients whose appointments are after yours.  Also, if you no show three or more times for appointments you may be dismissed from the clinic at the providers discretion.     Again, thank you for choosing [facility].  Our hope is that these requests will decrease the amount of time that you wait before being seen by our physicians.       _____________________________________________________________  Should you have questions after your visit to [facility], please contact our office at [phone] between the hours of 8:30 a.m. and 4:30 p.m.  Voicemails left after 4:30 p.m. will not be returned until the following business day.  For prescription refill requests, have your pharmacy contact our office.       Resources For Cancer Patients and their Caregivers ? [employer]: Can assist with transportation, wigs, general needs, runs Look Good Feel Better.        3184606177 ? Cancer Care: Provides financial assistance, online support groups, medication/co-pay assistance.  1-800-813-HOPE 617-793-5036) ? [facility] Assists [place]  cancer patients and their families through emotional , educational and financial support.  872-015-5685 ? [place] DSS Where to apply for food stamps, Medicaid and utility assistance. 671-856-5881 ? [employer]: Transportation to medical appointments. 540-668-0293 ? Social Security Administration: May apply for disability if have a Stage IV cancer. 4130748227 225-655-3403 ? [employer], Disability and Transit Services: Assists with nutrition, care and transit needs. [place] Support Programs: @10RELATIVEDAYS @ > Cancer Support Group  2nd Tuesday of the month 1pm-2pm, Journey Room  > Creative Journey  3rd Tuesday of the month 1130am-1pm, Journey Room  > Look Good Feel Better  1st Wednesday of the month 10am-12 noon, Journey Room (Call [place] to register [phone])

## 2017-04-14 ENCOUNTER — Ambulatory Visit (HOSPITAL_COMMUNITY): Payer: Medicare Other

## 2017-04-16 ENCOUNTER — Encounter (HOSPITAL_COMMUNITY): Payer: Medicare Other

## 2017-04-21 ENCOUNTER — Other Ambulatory Visit: Payer: Self-pay

## 2017-04-21 ENCOUNTER — Encounter (HOSPITAL_BASED_OUTPATIENT_CLINIC_OR_DEPARTMENT_OTHER): Payer: Medicare Other | Admitting: Oncology

## 2017-04-21 ENCOUNTER — Encounter (HOSPITAL_BASED_OUTPATIENT_CLINIC_OR_DEPARTMENT_OTHER): Payer: Medicare Other

## 2017-04-21 ENCOUNTER — Encounter (HOSPITAL_COMMUNITY): Payer: Self-pay | Admitting: Oncology

## 2017-04-21 VITALS — BP 129/75 | HR 81 | Temp 97.9°F | Resp 16 | Wt 203.4 lb

## 2017-04-21 DIAGNOSIS — C7889 Secondary malignant neoplasm of other digestive organs: Secondary | ICD-10-CM | POA: Diagnosis not present

## 2017-04-21 DIAGNOSIS — Z5112 Encounter for antineoplastic immunotherapy: Secondary | ICD-10-CM | POA: Diagnosis present

## 2017-04-21 DIAGNOSIS — K6289 Other specified diseases of anus and rectum: Secondary | ICD-10-CM | POA: Diagnosis not present

## 2017-04-21 DIAGNOSIS — C189 Malignant neoplasm of colon, unspecified: Secondary | ICD-10-CM

## 2017-04-21 DIAGNOSIS — C20 Malignant neoplasm of rectum: Secondary | ICD-10-CM

## 2017-04-21 DIAGNOSIS — C7801 Secondary malignant neoplasm of right lung: Secondary | ICD-10-CM

## 2017-04-21 DIAGNOSIS — G620 Drug-induced polyneuropathy: Secondary | ICD-10-CM | POA: Diagnosis not present

## 2017-04-21 LAB — COMPREHENSIVE METABOLIC PANEL
ALT: 26 U/L (ref 17–63)
AST: 33 U/L (ref 15–41)
Albumin: 3.5 g/dL (ref 3.5–5.0)
Alkaline Phosphatase: 72 U/L (ref 38–126)
Anion gap: 11 (ref 5–15)
BUN: 14 mg/dL (ref 6–20)
CO2: 19 mmol/L — ABNORMAL LOW (ref 22–32)
Calcium: 8.9 mg/dL (ref 8.9–10.3)
Chloride: 104 mmol/L (ref 101–111)
Creatinine, Ser: 0.77 mg/dL (ref 0.61–1.24)
GFR calc Af Amer: >60 mL/min (ref >60)
GFR calc non Af Amer: >60 mL/min (ref >60)
Glucose, Bld: 264 mg/dL — ABNORMAL HIGH (ref 65–99)
Potassium: 4 mmol/L (ref 3.5–5.1)
Sodium: 134 mmol/L — ABNORMAL LOW (ref 135–145)
Total Bilirubin: 1.6 mg/dL — ABNORMAL HIGH (ref 0.3–1.2)
Total Protein: 6.3 g/dL — ABNORMAL LOW (ref 6.5–8.1)

## 2017-04-21 LAB — CBC WITH DIFFERENTIAL/PLATELET
Basophils Absolute: 0 10*3/uL (ref 0.0–0.1)
Basophils Relative: 0 %
Eosinophils Absolute: 0.2 10*3/uL (ref 0.0–0.7)
Eosinophils Relative: 2 %
HCT: 40.5 % (ref 39.0–52.0)
Hemoglobin: 13.2 g/dL (ref 13.0–17.0)
Lymphocytes Relative: 31 %
Lymphs Abs: 2 10*3/uL (ref 0.7–4.0)
MCH: 30.3 pg (ref 26.0–34.0)
MCHC: 32.6 g/dL (ref 30.0–36.0)
MCV: 93.1 fL (ref 78.0–100.0)
Monocytes Absolute: 0.5 10*3/uL (ref 0.1–1.0)
Monocytes Relative: 8 %
Neutro Abs: 3.8 10*3/uL (ref 1.7–7.7)
Neutrophils Relative %: 59 %
Platelets: 178 10*3/uL (ref 150–400)
RBC: 4.35 MIL/uL (ref 4.22–5.81)
RDW: 15.1 % (ref 11.5–15.5)
WBC: 6.4 10*3/uL (ref 4.0–10.5)

## 2017-04-21 LAB — URINALYSIS, DIPSTICK ONLY
Bilirubin Urine: NEGATIVE
Glucose, UA: NEGATIVE mg/dL
Hgb urine dipstick: NEGATIVE
Ketones, ur: NEGATIVE mg/dL
Leukocytes, UA: NEGATIVE
Nitrite: NEGATIVE
Protein, ur: NEGATIVE mg/dL
Specific Gravity, Urine: 1.023 (ref 1.005–1.030)
pH: 5 (ref 5.0–8.0)

## 2017-04-21 MED ORDER — SODIUM CHLORIDE 0.9 % IV SOLN
1800.0000 mg/m2 | INTRAVENOUS | Status: DC
  Administered 2017-04-21: 3850 mg via INTRAVENOUS
  Filled 2017-04-21: qty 77

## 2017-04-21 MED ORDER — DEXAMETHASONE SODIUM PHOSPHATE 10 MG/ML IJ SOLN
INTRAMUSCULAR | Status: AC
  Filled 2017-04-21: qty 1

## 2017-04-21 MED ORDER — SODIUM CHLORIDE 0.9% FLUSH
10.0000 mL | INTRAVENOUS | Status: DC | PRN
  Administered 2017-04-21: 10 mL
  Filled 2017-04-21: qty 10

## 2017-04-21 MED ORDER — PALONOSETRON HCL INJECTION 0.25 MG/5ML
INTRAVENOUS | Status: AC
  Filled 2017-04-21: qty 5

## 2017-04-21 MED ORDER — GI COCKTAIL ~~LOC~~
30.0000 mL | Freq: Once | ORAL | Status: AC
  Administered 2017-04-21: 30 mL via ORAL
  Filled 2017-04-21: qty 30

## 2017-04-21 MED ORDER — DEXTROSE 5 % IV SOLN
650.0000 mg | Freq: Once | INTRAVENOUS | Status: AC
  Administered 2017-04-21: 650 mg via INTRAVENOUS
  Filled 2017-04-21: qty 32.5

## 2017-04-21 MED ORDER — DIPHENHYDRAMINE HCL 50 MG/ML IJ SOLN
50.0000 mg | Freq: Once | INTRAMUSCULAR | Status: AC
  Administered 2017-04-21: 50 mg via INTRAVENOUS

## 2017-04-21 MED ORDER — BEVACIZUMAB CHEMO INJECTION 400 MG/16ML
500.0000 mg | Freq: Once | INTRAVENOUS | Status: AC
  Administered 2017-04-21: 500 mg via INTRAVENOUS
  Filled 2017-04-21: qty 4

## 2017-04-21 MED ORDER — DIPHENHYDRAMINE HCL 50 MG/ML IJ SOLN
INTRAMUSCULAR | Status: AC
  Filled 2017-04-21: qty 1

## 2017-04-21 MED ORDER — FLUOROURACIL CHEMO INJECTION 2.5 GM/50ML
300.0000 mg/m2 | Freq: Once | INTRAVENOUS | Status: AC
  Administered 2017-04-21: 650 mg via INTRAVENOUS
  Filled 2017-04-21: qty 13

## 2017-04-21 MED ORDER — PALONOSETRON HCL INJECTION 0.25 MG/5ML
0.2500 mg | Freq: Once | INTRAVENOUS | Status: AC
  Administered 2017-04-21: 0.25 mg via INTRAVENOUS

## 2017-04-21 MED ORDER — DEXAMETHASONE SODIUM PHOSPHATE 10 MG/ML IJ SOLN
10.0000 mg | Freq: Once | INTRAMUSCULAR | Status: AC
  Administered 2017-04-21: 10 mg via INTRAVENOUS

## 2017-04-21 NOTE — Progress Notes (Signed)
Charles [name], [place] [id number]   CLINIC:  Medical Oncology/Hematology  PCP:  [name], MD [place] [id number] [phone]   REASON FOR VISIT:  Follow-up for Stage IVA adenocarcinoma of rectum with lung and pancreatic mets  CURRENT THERAPY: FOLFOX/Avastin, beginning 07/08/16   BRIEF ONCOLOGIC HISTORY:    Rectal cancer ([place])   07/24/2010 Initial Diagnosis    Invasive adenocarcinoma of rectum      09/09/2010 Concurrent Chemotherapy    S/P radiation and concurrent 5-FU continuous infusion from 09/09/10- 10/10/10.      11/14/2010 Surgery    S/P proctectomy with colorectal anastomosis and diverting loop ileostomy on 11/14/10 at [facility] by Dr. Harlon Ditty. Pathology reveals a pT3b N1 with 3/20 lymph nodes.      02/05/2011 - 07/14/2011 Chemotherapy    FOLFOX      08/18/2011 Surgery    Approximate date of surgery- [place] by Dr. Harlon Ditty       Remission         11/24/2013 Survivorship    Colonoscopy- somewhat fibrotic/friable anastomotic mucosal-status post biopsy (narrowing not felt to be clinically significant).  Negative pathology for malignancy      09/12/2014 Imaging    DVT in the left femoral venous system, left common iliac vein, IVC, and within the IVC filter Right lower extremity venography confirms chronic occlusion of the femoral venous system with collateralization. The right iliac venous system is patent and do      09/25/2014 PET scan    The right middle lobe pulmonary nodule is hypermetabolic, favored to represent a primary bronchogenic carcinoma.Equivocal mediastinal nodes, similar to surrounding blood pool. Bilateral adrenal hypermetabolism, felt to be physiologic      10/24/2014 Pathology Results    Lung, needle/core biopsy(ies), RML - ADENOCARCINOMA, SEE COMMENT metastatic adenocarcinoma of a colorectal primary      10/24/2014 Relapse/Recurrence         11/16/2014 Definitive Surgery    Bronchoscopy,  right video-assisted thoracoscopy, wedge resection of right middle lobe by Dr. Servando Snare      11/16/2014 Pathology Results    Lung, wedge biopsy/resection, right lingula and small portion of middle lobe - METASTATIC ADENOCARCINOMA, CONSISTENT WITH COLORECTAL PRIMARY, SPANNING 2.0 CM. - THE SURGICAL RESECTION MARGINS ARE NEGATIVE FOR ADENOCARCINOMA.      11/16/2014 Remission    THE SURGICAL RESECTION MARGINS ARE NEGATIVE FOR ADENOCARCINOMA.      03/07/2015 Imaging    CT CAP- Interval resection of right middle lobe metastasis. No acute process or evidence of metastatic disease in the chest, abdomen or pelvis. Improved right upper lobe reticular nodular opacities are favored to represent resolving infection.      09/14/2015 Imaging    CT CAP- No findings of recurrent malignancy. No recurrence along the wedge resection site of the right middle lobe. Right anterior abdominal wall focal hernia containing a knuckle of small bowel without complicating feature.      06/13/2016 Imaging    CT CAP- 1. New right-sided pleural metastasis/mass. Other smaller right-sided pulmonary nodules which are all pleural-based and most likely represent pleural metastasis. 2. No convincing evidence of abdominopelvic nodal metastasis. 3. Constellation of findings, including pancreatic atrophy, duct dilatation, and pancreatic head soft tissue fullness which are highly suspicious for pancreatic adenocarcinoma. Metastatic disease felt much less likely. Consider endoscopic ultrasound sampling or ERCP. Cannot exclude superimposed acute pancreatitis. 4. New enlargement of the appendiceal tip with subtle surrounding edema. Cannot  exclude early or mild appendicitis. 5. Coronary artery atherosclerosis. Aortic atherosclerosis. 6. 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      06/13/2016 Progression    CT scan demonstrates progression of disease      06/19/2016 Procedure    EUS with FNA by Dr.  Ardis Hughs      06/20/2016 Pathology Results    FINE NEEDLE ASPIRATION, ENDOSCOPIC, PANCREAS UNCINATE AREA(SPECIMEN 1 OF 1 COLLECTED 06/19/16): MALIGNANT CELLS CONSISTENT WITH METASTATIC ADENOCARCINOMA.      06/25/2016 PET scan    1. Two pleural-based nodules in the right hemithorax are hypermetabolic. Metastatic disease is a distinct consideration. The scattered pulmonary parenchymal nodule seen on previous diagnostic CT imaging are below the threshold for reliable resolution on PET imaging. 2. Hypermetabolic lesion pancreatic head, consistent with neoplasm. As noted on prior CT, adenocarcinoma is any consideration. No evidence for hypermetabolic abdominal lymphadenopathy. Mottled uptake noted in the liver, but no discrete hepatic metastases are evident on PET imaging. 3. Appendix remains distended up to 0.9-10 mm diameter with a stone towards the tip in subtle periappendiceal edema/inflammation. The appendix is hypermetabolic along its length. Imaging features are relatively stable in the 12 day interval since prior CT scan. While appendicitis is a consideration, the relative stability over 12 days would be unusual for that etiology. Continued close follow-up recommended.      06/26/2016 Pathology Results    Not enough tissue for foundationONE or K-ras testing.      07/07/2016 Procedure    Port placed by Dr. Arnoldo Morale      07/08/2016 -  Chemotherapy    The patient had pegfilgrastim (NEULASTA) injection 6 mg, 6 mg, Subcutaneous,  Once, 0 of 4 cycles  ondansetron (ZOFRAN) IVPB 8 mg, 8 mg, Intravenous,  Once, 0 of 4 cycles  leucovorin 804 mg in dextrose 5 % 250 mL infusion, 400 mg/m2, Intravenous,  Once, 0 of 4 cycles  oxaliplatin (ELOXATIN) 170 mg in dextrose 5 % 500 mL chemo infusion, 85 mg/m2, Intravenous,  Once, 0 of 4 cycles  fluorouracil (ADRUCIL) 4,800 mg in sodium chloride 0.9 % 150 mL chemo infusion, 2,400 mg/m2 = 4,800 mg, Intravenous, 1 Day/Dose, 0 of 4 cycles  fluorouracil  (ADRUCIL) chemo injection 800 mg, 400 mg/m2, Intravenous,  Once, 0 of 4 cycles  pegfilgrastim (NEULASTA) injection 6 mg, 6 mg, Subcutaneous,  Once, 2 of 2 cycles  ondansetron (ZOFRAN) IVPB 8 mg, 8 mg, Intravenous,  Once, 12 of 12 cycles  leucovorin 660 mg in dextrose 5 % 250 mL infusion, 660 mg (original dose ), Intravenous,  Once, 12 of 12 cycles Dose modification: 660 mg (Cycle 1)  oxaliplatin (ELOXATIN) 140 mg in dextrose 5 % 500 mL chemo infusion, 140 mg (original dose ), Intravenous,  Once, 12 of 12 cycles Dose modification: 140 mg (Cycle 1), 119 mg (85 % of original dose 140 mg, Cycle 8, Reason: Provider Judgment)  fluorouracil (ADRUCIL) 3,850 mg in sodium chloride 0.9 % 150 mL chemo infusion, 3,850 mg (original dose ), Intravenous, 1 Day/Dose, 12 of 12 cycles Dose modification: 3,850 mg (Cycle 1)  fluorouracil (ADRUCIL) chemo injection 700 mg, 700 mg (original dose ), Intravenous,  Once, 12 of 12 cycles Dose modification: 700 mg (Cycle 1)  palonosetron (ALOXI) injection 0.25 mg, 0.25 mg, Intravenous,  Once, 7 of 12 cycles Administration: 0.25 mg (09/30/2016)  bevacizumab (AVASTIN) 475 mg in sodium chloride 0.9 % 100 mL chemo infusion, 5 mg/kg = 475 mg, Intravenous,  Once, 6 of 11  cycles Administration: 500 mg (09/30/2016)  leucovorin 880 mg in dextrose 5 % 250 mL infusion, 400 mg/m2 = 880 mg, Intravenous,  Once, 7 of 12 cycles Administration: 880 mg (09/30/2016)  oxaliplatin (ELOXATIN) 185 mg in dextrose 5 % 500 mL chemo infusion, 85 mg/m2 = 185 mg, Intravenous,  Once, 7 of 12 cycles Administration: 185 mg (09/30/2016)  fluorouracil (ADRUCIL) chemo injection 900 mg, 400 mg/m2 = 900 mg, Intravenous,  Once, 7 of 12 cycles Administration: 900 mg (09/30/2016)  fluorouracil (ADRUCIL) 5,300 mg in sodium chloride 0.9 % 144 mL chemo infusion, 2,400 mg/m2 = 5,300 mg, Intravenous, 1 Day/Dose, 7 of 12 cycles Administration: 5,300 mg (09/30/2016)  for chemotherapy treatment.         08/27/2016 Imaging    CT CAP- 1. Pulmonary metastatic disease is stable. 2. Pancreatic head mass, grossly stable, with progressive atrophy of the body and tail of the pancreas. Stable peripancreatic lymph node. 3. Mild circumferential rectal wall thickening, stable. 4. Aortic atherosclerosis (ICD10-170.0). Coronary artery calcification. 5. Hyperattenuating lesion off the lower pole left kidney, stable, too small to characterize. Continued attention on followup exams is warranted. 6. Persistent wall thickening and mild dilatation of the proximal appendix, of uncertain etiology. Continued attention on followup exams is warranted.      11/06/2016 Imaging    CT C/A/P: IMPRESSION: 1. Stable pulmonary metastatic disease. 2. Similar appearing pancreatic head mass. Progressed atrophy of the pancreatic body and tail. Stable peripancreatic lymph node. 3. Stable hypoattenuating lesion partially exophytic off the inferior pole of the left kidney. 4. Aortic atherosclerosis. 5. Persistent mild wall thickening and mild dilatation of the appendix.       01/30/2017 Imaging    Ct C/A/P: IMPRESSION: 1. Stable pulmonary metastatic disease. 2. Stable to slight increase in size of pancreatic head mass. 3.  Aortic Atherosclerosis (ICD10-I70.0). 4. Similar appearance of mild wall thickening of the appendix which contains an appendicolith.         INTERVAL HISTORY:  Mr. Mayorga 66 y.o. male returns for follow-up for Stage IVA rectal cancer with lung and pancreatic mets; here for consideration of next cycle chemotherapy.   Due for cycle #21 FOLFOX/Avastin today. Therapy has been dose-reduced by 25% with cycle #12 d/t progressive fatigue/weakness, diarrhea, and neuropathy.  Oxaliplatin permanently discontinued due to worsening neuropathy with cycle 16 of treatment. He states that he has been having difficulty with his bowel movements. He would have very small bowel movements and need to go  frequently. Due to the number of bowel movements he is having he is having anal fissures again which are painful. He states that his right arm has been having some numbness from fingers to shoulders for the past one week however if he moves his arms it improves.  He states his neuropathy is stable. 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 He denies any pain symptoms. He states he only gets diarrhea for about 3-4 days after chemo but hasn't been using the lomotil because it makes his stools too hard.    REVIEW OF SYSTEMS:  Review of Systems  Constitutional: Positive for fatigue. Negative for chills and fever.  HENT:  Negative.  Negative for lump/mass and nosebleeds.   Eyes: Negative.   Respiratory: Negative.  Negative for cough and shortness of breath.   Cardiovascular: Negative for chest pain and leg swelling.  Gastrointestinal: Positive for diarrhea. Negative for abdominal pain, blood in stool, constipation, nausea, rectal pain and vomiting.  Endocrine: Negative.   Genitourinary: Negative.  Negative for dysuria and hematuria.   Musculoskeletal: Negative.  Negative for arthralgias.  Skin: Negative.  Negative for rash.  Neurological: Positive for numbness. Negative for dizziness and headaches.  Hematological: Negative.  Negative for adenopathy. Does not bruise/bleed easily.  Psychiatric/Behavioral: Negative.  Negative for depression and sleep disturbance. The patient is not nervous/anxious.      PAST MEDICAL/SURGICAL HISTORY:  Past Medical History:  Diagnosis Date  . Chronic anticoagulation   . Colon cancer ([place]) 07/24/2010   rectal ca, inv adenocarcinoma  . Depression 04/01/2011  . Diabetes mellitus without complication ([place])   . DVT (deep venous thrombosis) ([place]) 05/09/2011  . GERD (gastroesophageal reflux disease)   . High output ileostomy ([place]) 05/21/2011  . History of kidney stones   . HTN (hypertension)   . Hx of radiation therapy 09/02/10  to 10/14/10   pelvis  . Lung metastasis ([place])   . Neuropathy   . Peripheral vascular disease ([place])    dvt's,pe  . Pneumonia    hx x3  . Pulmonary embolism ([place])   . Rectal cancer ([place]) 01/15/2011   S/P radiation and concurrent 5-FU continuous infusion from 09/09/10- 10/10/10.  S/P proctectomy with colorectal anastomosis and diverting loop ileostomy on 11/14/10 at [facility] by Dr. Harlon Ditty. Pathology reveals a pT3b N1 with 3/20 lymph nodes.     Past Surgical History:  Procedure Laterality Date  . COLON SURGERY  11/14/2010   proctectomy with colorectal anastomosis and diverting loop ileostomy (temporary planned)  . COLONOSCOPY  07/2010   proximal rectal apple core mass 10-14cm from anal verge (adenocarcinoma), 2-3cm distal rectal carpet polyp s/p piecemeal snare polypectomy (adenoma)  . COLONOSCOPY  04/21/2012   RMR: Friable,fibrotic appearing colorectal anastomosis producing some luminal narrowing-not felt to be critical. path: focal erosion with slight inflammation and hyperemia. SURVEILLANCE DUE DEC 2015  . COLONOSCOPY N/A 11/24/2013   Dr. Rourk:somewhat fibrotic/friable anastomotic mucosal-status post biopsy (narrowing not felt to be clinically significant) Single colonic diverticulum. benign polypoid rectal mucosa  . colostomy reversal  april 2013  . ESOPHAGOGASTRODUODENOSCOPY  07/2010   RMR: schatki ring s/p dilation, small hh, SB bx benign  . ESOPHAGOGASTRODUODENOSCOPY N/A 09/04/2015   Procedure: ESOPHAGOGASTRODUODENOSCOPY (EGD);  Surgeon: Daneil Dolin, MD;  Location: [facility] END[facility];  Service: Endoscopy;  Laterality: N/A;  730  . EUS  08/2010   Dr. Owens Loffler. uT3N0 circumferential, nearly obstruction rectosigmoid adenocarcinoma, distal edge 12cm from anal verge  . EUS N/A 06/19/2016   Procedure: UPPER ENDOSCOPIC ULTRASOUND (EUS) RADIAL;  Surgeon: Milus Banister, MD;  Location: [facility] ENDOSCOPY;  Service: Endoscopy;  Laterality: N/A;  . HERNIA REPAIR     abd hernia repair  . IVC filter     . ivc filter    . port a cath placement    . PORT-A-CATH REMOVAL  09/24/2011   Procedure: REMOVAL PORT-A-CATH;  Surgeon: Donato Heinz, MD;  Location: [facility] ORS;  Service: General;  Laterality: N/A;  Minor Room  . PORTACATH PLACEMENT Right 07/07/2016   Procedure: INSERTION PORT-A-CATH;  Surgeon: Aviva Signs, MD;  Location: [facility] ORS;  Service: General;  Laterality: Right;  Marland Kitchen VIDEO ASSISTED THORACOSCOPY (VATS)/WEDGE RESECTION Right 11/15/2014   Procedure: VIDEO ASSISTED THORACOSCOPY (VATS)/LUNG RESECTION WITH RIGHT LINGULECTOMY;  Surgeon: [name], MD;  Location: [place];  Service: Thoracic;  Laterality: Right;  Marland Kitchen VIDEO BRONCHOSCOPY N/A 11/15/2014   Procedure: VIDEO BRONCHOSCOPY;  Surgeon: [name], MD;  Location: [place];  Service: Thoracic;  Laterality: N/A;     SOCIAL HISTORY:  Social History   Socioeconomic History  . Marital status: Married    Spouse name: Not on file  . Number of children: 2  . Years of education: Not on file  . Highest education level: Not on file  Social Needs  . Financial resource strain: Not on file  . Food insecurity - worry: Not on file  . Food insecurity - inability: Not on file  . Transportation needs - medical: Not on file  . Transportation needs - non-medical: Not on file  Occupational History  . Occupation: self-employed Regulatory affairs officer, currently not working    Fish farm manager: SELF EMPLOYED  Tobacco Use  . Smoking status: Never Smoker  . Smokeless tobacco: Never Used  Substance and Sexual Activity  . Alcohol use: No  . Drug use: No  . Sexual activity: Yes    Birth control/protection: None    Comment: married  Other Topics Concern  . Not on file  Social History Narrative  . Not on file    FAMILY HISTORY:  Family History  Problem Relation Age of Onset  . Cancer Brother        throat  . Cancer Brother        prostate  . Colon cancer Neg Hx   . Liver disease Neg Hx   . Inflammatory bowel disease Neg Hx     CURRENT  MEDICATIONS:  Outpatient Encounter Medications as of 04/21/2017  Medication Sig  . acetaminophen (TYLENOL) 500 MG tablet Take 1,000 mg by mouth every 6 (six) hours as needed.  . clonazePAM (KLONOPIN) 1 MG tablet Take 1 tablet (1 mg total) by mouth 2 (two) times daily as needed for anxiety.  Marland Kitchen dexamethasone (DECADRON) 4 MG tablet Take 2 tablets (8 mg total) by mouth daily. Start the day after chemotherapy for 2 days. Take with food.  . Diphenhyd-Hydrocort-Nystatin (FIRST-DUKES MOUTHWASH) SUSP Use as directed 5 mLs 4 (four) times daily in the mouth or throat.  . diphenoxylate-atropine (LOMOTIL) 2.5-0.025 MG tablet Take 1 tablet by mouth 4 (four) times daily as needed for diarrhea or loose stools.  Marland Kitchen dronabinol (MARINOL) 2.5 MG capsule Take 1 capsule (2.5 mg total) by mouth 2 (two) times daily before a meal.  . fluorouracil CALGB 29528 in sodium chloride 0.9 % 150 mL Inject into the vein over 96 hr.  . gabapentin (NEURONTIN) 300 MG capsule TAKE 3 CAPSULES BY MOUTH TWICE DAILY  . glimepiride (AMARYL) 2 MG tablet Take 2 mg by mouth daily with breakfast.   . HYDROcodone-acetaminophen (NORCO/VICODIN) 5-325 MG tablet Take 1-2 tablets by mouth every 6 (six) hours as needed for moderate pain.  Marland Kitchen leucovorin in dextrose 5 % 250 mL Inject into the vein once.  Marland Kitchen levofloxacin (LEVAQUIN) 500 MG tablet Take 1 tablet (500 mg total) by mouth daily.  Marland Kitchen lisinopril (PRINIVIL,ZESTRIL) 10 MG tablet   . mirtazapine (REMERON) 7.5 MG tablet Take 7.5 mg by mouth at bedtime.  Marland Kitchen omeprazole (PRILOSEC) 20 MG capsule Take 20 mg by mouth 2 (two) times daily before a meal.   . ondansetron (ZOFRAN) 8 MG tablet Take 1 tablet (8 mg total) by mouth 2 (two) times daily as needed for refractory nausea / vomiting. Start on day 3 after chemotherapy.  . OXALIPLATIN IV Inject into the vein.  . potassium chloride SA (K-DUR,KLOR-CON) 20 MEQ tablet Take 1 tablet (20 mEq total) by mouth 2 (two) times daily.  . prochlorperazine (COMPAZINE) 10  MG tablet Take 1 tablet (10 mg total) by mouth every 6 (six) hours as needed (Nausea or vomiting).  . psyllium (REGULOID) 0.52 g capsule Take 0.52 g by mouth daily.  . rivaroxaban (XARELTO) 20 MG TABS tablet Take 1 tablet (20 mg total) by mouth daily with supper.  . tamsulosin (FLOMAX) 0.4 MG CAPS capsule Take 0.4 mg by mouth at bedtime.   . traZODone (DESYREL) 100 MG tablet Take 100 mg by mouth at bedtime.  Marland Kitchen zinc oxide 20 % ointment Apply 1 application topically as needed for irritation.   Facility-Administered Encounter Medications as of 04/21/2017  Medication  . sodium chloride 0.9 % injection 10 mL    ALLERGIES:  Allergies  Allergen Reactions  . Oxycodone     Blisters, hallucinations  . Tramadol     Blisters, hallucinations  . Trazodone And Nefazodone Other (See Comments)    hallucinations     PHYSICAL EXAM:  ECOG Performance status: 1 - Symptomatic; remains independent   Blood pressure 127/79, pulse 108, respiratory 18, temp 97.7, O2 sat 100%.   Physical Exam  Constitutional: He is oriented to person, place, and time and well-developed, well-nourished, and in no distress.  HENT:  Head: Normocephalic.  Mouth/Throat: Oropharynx is clear and moist.  Eyes: Conjunctivae are normal. No scleral icterus.  Neck: Normal range of motion. Neck supple.  Cardiovascular: Normal rate and regular rhythm.  Pulmonary/Chest: Effort normal and breath sounds normal. No respiratory distress.  Abdominal: Soft. Bowel sounds are normal. There is no tenderness.  Musculoskeletal: Normal range of motion. He exhibits no edema.  Lymphadenopathy:    He has no cervical adenopathy.       Right: No supraclavicular adenopathy present.       Left: No supraclavicular adenopathy present.  Neurological: He is alert and oriented to person, place, and time. No cranial nerve deficit.  Skin: Skin is warm and dry. No rash noted.  Psychiatric: Mood, memory, affect and judgment normal.  Nursing note and  vitals reviewed.    LABORATORY DATA:  I have reviewed the labs as listed.  CBC    Component Value Date/Time   WBC 6.4 04/21/2017 0846   RBC 4.35 04/21/2017 0846   HGB 13.2 04/21/2017 0846   HGB 11.6 (L) 02/04/2011 1059   HCT 40.5 04/21/2017 0846   HCT 34.7 (L) 02/04/2011 1059   PLT 178 04/21/2017 0846   PLT 282 02/04/2011 1059   MCV 93.1 04/21/2017 0846   MCV 76.0 (L) 02/04/2011 1059   MCH 30.3 04/21/2017 0846   MCHC 32.6 04/21/2017 0846   RDW 15.1 04/21/2017 0846   RDW 18.5 (H) 02/04/2011 1059   LYMPHSABS 2.0 04/21/2017 0846   LYMPHSABS 1.1 02/04/2011 1059   MONOABS 0.5 04/21/2017 0846   MONOABS 0.5 02/04/2011 1059   EOSABS 0.2 04/21/2017 0846   EOSABS 0.1 02/04/2011 1059   BASOSABS 0.0 04/21/2017 0846   BASOSABS 0.1 02/04/2011 1059   CMP Latest Ref Rng & Units 04/21/2017 04/07/2017 03/31/2017  Glucose 65 - 99 mg/dL 264(H) 201(H) 91  BUN 6 - 20 mg/dL _0 Creatinine 0.61 - 1.24 mg/dL 0.77 0.70 0.80  Sodium 135 - 145 mmol/L 134(L) 140 140  Potassium 3.5 - 5.1 mmol/L 4.0 3.8 4.0  Chloride 101 - 111 mmol/L 104 109 107  CO2 22 - 32 mmol/L 19(L) 23 24  Calcium 8.9 - 10.3 mg/dL 8.9 8.7(L) 8.8(L)  Total Protein 6.5 - 8.1 g/dL 6.3(L) 5.8(L) -  Total Bilirubin 0.3 - 1.2  mg/dL 1.6(H) 0.6 -  Alkaline Phos 38 - 126 U/L 72 83 -  AST 15 - 41 U/L 33 25 -  ALT 17 - 63 U/L 26 19 -    PENDING LABS:    DIAGNOSTIC IMAGING:  *The following radiologic images and reports have been reviewed independently and agree with below findings.  CT chest/abd/pelvis: 11/06/16 CLINICAL DATA:  Patient with history of stage IV rectal carcinoma. Restaging evaluation.  EXAM: CT CHEST, ABDOMEN, AND PELVIS WITH CONTRAST  TECHNIQUE: Multidetector CT imaging of the chest, abdomen and pelvis was performed following the standard protocol during bolus administration of intravenous contrast.  CONTRAST:  120m ISOVUE-300 IOPAMIDOL (ISOVUE-300) INJECTION 61%  COMPARISON:  CT CAP  08/26/2016.  FINDINGS: CT CHEST FINDINGS  Cardiovascular: Right anterior chest wall Port-A-Cath is present with tip terminating in the superior vena cava. The heart is normal in size. Coronary vascular calcifications. Aorta and main pulmonary artery normal in caliber.  Mediastinum/Nodes: No enlarged axillary, mediastinal or hilar lymphadenopathy. The esophagus is normal in appearance.  Lungs/Pleura: Central airways are patent. Dependent atelectasis within the bilateral lower lobes. Stable 3 mm left upper lobe nodule (image 60; series 7). Stable 7 mm right upper lobe nodule (image 75; series 7). Stable 6 mm subpleural right upper lobe nodule (image 40; series 7). Stable postsurgical changes within the right middle lobe. No pleural effusion or pneumothorax.  Musculoskeletal: Thoracic spine degenerative changes.  CT ABDOMEN PELVIS FINDINGS  Hepatobiliary: Liver is normal in size and contour. Gallbladder is unremarkable. No intrahepatic or extrahepatic biliary ductal dilatation.  Pancreas: Progressed atrophy of the pancreatic body and tail. Fullness/mass within the head of the pancreas measures 2.1 cm, similar to prior when measured at the same location (image 57; series 4).  Spleen: Unremarkable  Adrenals/Urinary Tract: Re- demonstrated calcifications within the left adrenal gland. Normal appearing right adrenal gland. Kidneys enhance symmetrically with contrast. No hydronephrosis. Urinary bladder is unremarkable. Stable 10 mm exophytic lesion off the inferior pole left kidney (image 88; series 2).  Stomach/Bowel: Persistent mild dilatation of the distal appendix measuring 8 mm with associated appendicoliths. Unchanged mild wall thickening of the rectum. Stable appearance of the rectosigmoid anastomosis. No evidence for small bowel obstruction. Stool throughout the colon. Stomach is decompressed. Small hiatal hernia. No free fluid or free intraperitoneal  air.  Vascular/Lymphatic: Normal caliber abdominal aorta. Peripheral calcified atherosclerotic plaque. Inferior vena cava filter in place. Stable 10 mm peripancreatic lymph node (image 62; series 2)  Reproductive: Dystrophic calcifications in the prostate.  Other: Stable presacral soft tissue thickening. Fat containing inguinal hernias bilaterally.  Musculoskeletal: No aggressive or acute appearing osseous lesions. Lumbar spine degenerative changes.  IMPRESSION: 1. Stable pulmonary metastatic disease. 2. Similar appearing pancreatic head mass. Progressed atrophy of the pancreatic body and tail. Stable peripancreatic lymph node. 3. Stable hypoattenuating lesion partially exophytic off the inferior pole of the left kidney. 4. Aortic atherosclerosis. 5. Persistent mild wall thickening and mild dilatation of the appendix.   Electronically Signed   By: DLovey NewcomerM.D.   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 Underwent concurrent chemoradiation, followed by surgical resection. He went on to have about 5 months of adjuvant FOLFOX chemotherapy [facility] He was  found to be in remission after that time. Subsequent restaging PET imaging in 09/2014 revealed hypermetabolic lung lesion, which was biopsied to show adenocarcinoma consistent with colorectal primary. Underwent wedge resection with Dr. Servando Snare in 11/2014. On 06/13/16, he was noted to have several pleural based nodules representing metastatic disease, as well as pancreatic lesion suspicion for malignancy. Pancreatic lesion was biopsied via EUS and found to be metastatic adenocarcinoma.  Treatment started at [facility] with FOLFOX/Avastin on 07/08/16. Oxaliplatin permanently  discontinued due to worsening neuropathy with cycle 16 of treatment.  -Last restaging CT chest/abd/pelvis showed stable disease except for mild increase in his pancreatic lesion to 2.5 cm.  -Will plan to get his 3 month restaging scans prior to his next visit. -Labs reviewed today proceed with 5FU/leucovorin/avastin.  -Continue chemo every 2 weeks as scheduled with 5FU/leucovorin, and Avastin.   Peripheral neuropathy:  -Worsened to grade 3. Discontinued oxaliplatin. -Continue gabapentin.  Anal pain/fissures: -Recommended for his to try putting an emollient such as vaseline on his rectum frequently so that he will not get the fissures/cracks since he using the bathroom so frequently.     Dispo:  -Continue chemo every 2 weeks as scheduled.  -Return to cancer center in 4 weeks for follow-up.   All questions were answered to patient's stated satisfaction. Encouraged patient to call with any new concerns or questions before his next visit to the cancer center and we can certain see him sooner, if needed.    Twana First, MD

## 2017-04-21 NOTE — Patient Instructions (Signed)
[facility] Discharge Instructions for Patients Receiving Chemotherapy   Beginning January 23rd 2017 lab work for the Cancer Center will be done in the  Main lab at [place] on 1st floor. If you have a lab appointment with the Cancer Center please come in thru the  Main Entrance and check in at the main information desk   Today you received the following chemotherapy agents   To help prevent nausea and vomiting after your treatment, we encourage you to take your nausea medication     If you develop nausea and vomiting, or diarrhea that is not controlled by your medication, call the clinic.  The clinic phone number is [phone]. Office hours are Monday-Friday 8:30am-5:00pm.  BELOW ARE SYMPTOMS THAT SHOULD BE REPORTED IMMEDIATELY:  *FEVER GREATER THAN 101.0 F  *CHILLS WITH OR WITHOUT FEVER  NAUSEA AND VOMITING THAT IS NOT CONTROLLED WITH YOUR NAUSEA MEDICATION  *UNUSUAL SHORTNESS OF BREATH  *UNUSUAL BRUISING OR BLEEDING  TENDERNESS IN MOUTH AND THROAT WITH OR WITHOUT PRESENCE OF ULCERS  *URINARY PROBLEMS  *BOWEL PROBLEMS  UNUSUAL RASH Items with * indicate a potential emergency and should be followed up as soon as possible. If you have an emergency after office hours please contact your primary care physician or go to the nearest emergency department.  Please call the clinic during office hours if you have any questions or concerns.   You may also contact the Patient Navigator at [phone] should you have any questions or need assistance in obtaining follow up care.      Resources For Cancer Patients and their Caregivers ? American Cancer Society: Can assist with transportation, wigs, general needs, runs Look Good Feel Better.        1-888-227-6333 ? Cancer Care: Provides financial assistance, online support groups, medication/co-pay assistance.  1-800-813-HOPE (4673) ? [facility] Assists [place] cancer  patients and their families through emotional , educational and financial support.  336-427-4357 ? [place] DSS Where to apply for food stamps, Medicaid and utility assistance. 336-342-1394 ? [employer]: Transportation to medical appointments. 336-347-2287 ? Social Security Administration: May apply for disability if have a Stage IV cancer. 336-342-7796 1-800-772-1213 ? [place] Aging, Disability and Transit Services: Assists with nutrition, care and transit needs. 336-349-2343         

## 2017-04-21 NOTE — Progress Notes (Signed)
Labs reviewed with MD, proceed with Treatment today.   Continuous 5FU pump connected today per protocol.  Treatment given per orders. Patient tolerated it well without problems. Vitals stable and discharged home from clinic ambulatory. Follow up as scheduled.

## 2017-04-21 NOTE — Patient Instructions (Signed)
[facility] at [facility] Discharge Instructions  RECOMMENDATIONS MADE BY THE CONSULTANT AND ANY TEST RESULTS WILL BE SENT TO YOUR REFERRING PHYSICIAN.  You were seen today by Dr. Louise Zhou    Thank you for choosing [facility] at [facility] to provide your oncology and hematology care.  To afford each patient quality time with our provider, please arrive at least 15 minutes before your scheduled appointment time.    If you have a lab appointment with the Cancer Center please come in thru the  Main Entrance and check in at the main information desk  You need to re-schedule your appointment should you arrive 10 or more minutes late.  We strive to give you quality time with our providers, and arriving late affects you and other patients whose appointments are after yours.  Also, if you no show three or more times for appointments you may be dismissed from the clinic at the providers discretion.     Again, thank you for choosing [facility].  Our hope is that these requests will decrease the amount of time that you wait before being seen by our physicians.       _____________________________________________________________  Should you have questions after your visit to [facility], please contact our office at [phone] between the hours of 8:30 a.m. and 4:30 p.m.  Voicemails left after 4:30 p.m. will not be returned until the following business day.  For prescription refill requests, have your pharmacy contact our office.       Resources For Cancer Patients and their Caregivers ? American Cancer Society: Can assist with transportation, wigs, general needs, runs Look Good Feel Better.        1-888-227-6333 ? Cancer Care: Provides financial assistance, online support groups, medication/co-pay assistance.  1-800-813-HOPE (4673) ? [facility] Assists [place] cancer patients and their  families through emotional , educational and financial support.  336-427-4357 ? [place] DSS Where to apply for food stamps, Medicaid and utility assistance. 336-342-1394 ? RC[employer]: Transportation to medical appointments. 336-347-2287 ? Social Security Administration: May apply for disability if have a Stage IV cancer. 336-342-7796 1-800-772-1213 ? [place] Aging, Disability and Transit Services: Assists with nutrition, care and transit needs. 336-349-2343  Cancer Center Support Programs: @10RELATIVEDAYS@ > Cancer Support Group  2nd Tuesday of the month 1pm-2pm, Journey Room  > Creative Journey  3rd Tuesday of the month 1130am-1pm, Journey Room  > Look Good Feel Better  1st Wednesday of the month 10am-12 noon, Journey Room (Call [employer] to register 1-800-395-5775)    

## 2017-04-22 LAB — CEA: CEA: 2.4 ng/mL (ref 0.0–4.7)

## 2017-04-23 ENCOUNTER — Encounter (HOSPITAL_COMMUNITY): Payer: Self-pay

## 2017-04-23 ENCOUNTER — Encounter (HOSPITAL_BASED_OUTPATIENT_CLINIC_OR_DEPARTMENT_OTHER): Payer: Medicare Other

## 2017-04-23 VITALS — BP 150/89 | HR 105 | Temp 98.6°F | Resp 16

## 2017-04-23 DIAGNOSIS — C20 Malignant neoplasm of rectum: Secondary | ICD-10-CM | POA: Diagnosis present

## 2017-04-23 MED ORDER — HEPARIN SOD (PORK) LOCK FLUSH 100 UNIT/ML IV SOLN
500.0000 [IU] | Freq: Once | INTRAVENOUS | Status: AC | PRN
Start: 2017-04-23 — End: 2017-04-23
  Administered 2017-04-23: 500 [IU]

## 2017-04-23 MED ORDER — SODIUM CHLORIDE 0.9% FLUSH
10.0000 mL | INTRAVENOUS | Status: DC | PRN
  Administered 2017-04-23: 10 mL
  Filled 2017-04-23: qty 10

## 2017-04-23 NOTE — Progress Notes (Signed)
Charles [name] tolerated 5FU pump well without complaints or incident. 5FU pump discontinued with portacath flushed with 10 ml NS and 5 ml Heparin easily per protocol then de-accessed. VSS Pt discharged self ambulatory in satisfactory condition

## 2017-04-23 NOTE — Patient Instructions (Signed)
[place] at [facility] Discharge Instructions  RECOMMENDATIONS MADE BY THE CONSULTANT AND ANY TEST RESULTS WILL BE SENT TO YOUR REFERRING PHYSICIAN.  5FU pump discontinued with portacath flushed per protocol.Follow-up as scheduled. Call clinic for any questions or concerns  Thank you for choosing [place] at [facility] to provide your oncology and hematology care.  To afford each patient quality time with our provider, please arrive at least 15 minutes before your scheduled appointment time.    If you have a lab appointment with the [facility] please come in thru the  Main Entrance and check in at the main information desk  You need to re-schedule your appointment should you arrive 10 or more minutes late.  We strive to give you quality time with our providers, and arriving late affects you and other patients whose appointments are after yours.  Also, if you no show three or more times for appointments you may be dismissed from the clinic at the providers discretion.     Again, thank you for choosing [facility].  Our hope is that these requests will decrease the amount of time that you wait before being seen by our physicians.       _____________________________________________________________  Should you have questions after your visit to [facility], please contact our office at [phone] between the hours of 8:30 a.m. and 4:30 p.m.  Voicemails left after 4:30 p.m. will not be returned until the following business day.  For prescription refill requests, have your pharmacy contact our office.       Resources For Cancer Patients and their Caregivers ? American Cancer Society: Can assist with transportation, wigs, general needs, runs Look Good Feel Better.        585-698-9545 ? Cancer Care: Provides financial assistance, online support groups, medication/co-pay assistance.  1-800-813-HOPE  817 843 9665) ? [facility] Assists [place] cancer patients and their families through emotional , educational and financial support.  918-852-4537 ? [place] DSS Where to apply for food stamps, Medicaid and utility assistance. 605 397 9993 ? RCATS: Transportation to medical appointments. 226-333-1864 ? Social Security Administration: May apply for disability if have a Stage IV cancer. 252-530-7442 725-833-8648 ? [employer], Disability and Transit Services: Assists with nutrition, care and transit needs. [place] Support Programs: @10RELATIVEDAYS @ > Cancer Support Group  2nd Tuesday of the month 1pm-2pm, Journey Room  > Creative Journey  3rd Tuesday of the month 1130am-1pm, Journey Room  > Look Good Feel Better  1st Wednesday of the month 10am-12 noon, Journey Room (Call [place] to register [phone])

## 2017-05-01 ENCOUNTER — Encounter (HOSPITAL_COMMUNITY): Payer: Self-pay

## 2017-05-01 ENCOUNTER — Ambulatory Visit (HOSPITAL_COMMUNITY)
Admission: RE | Admit: 2017-05-01 | Discharge: 2017-05-01 | Disposition: A | Discharge status: Home / Self Care | Payer: Medicare Other | Source: Ambulatory Visit | Attending: Oncology | Admitting: Oncology

## 2017-05-01 DIAGNOSIS — I2510 Atherosclerotic heart disease of native coronary artery without angina pectoris: Secondary | ICD-10-CM | POA: Diagnosis not present

## 2017-05-01 DIAGNOSIS — C7889 Secondary malignant neoplasm of other digestive organs: Secondary | ICD-10-CM | POA: Diagnosis not present

## 2017-05-01 DIAGNOSIS — I700 Atherosclerosis of aorta: Secondary | ICD-10-CM | POA: Diagnosis not present

## 2017-05-01 DIAGNOSIS — K760 Fatty (change of) liver, not elsewhere classified: Secondary | ICD-10-CM | POA: Diagnosis not present

## 2017-05-01 DIAGNOSIS — C785 Secondary malignant neoplasm of large intestine and rectum: Secondary | ICD-10-CM | POA: Diagnosis not present

## 2017-05-01 DIAGNOSIS — C20 Malignant neoplasm of rectum: Secondary | ICD-10-CM | POA: Insufficient documentation

## 2017-05-01 DIAGNOSIS — R918 Other nonspecific abnormal finding of lung field: Secondary | ICD-10-CM | POA: Insufficient documentation

## 2017-05-01 DIAGNOSIS — K439 Ventral hernia without obstruction or gangrene: Secondary | ICD-10-CM | POA: Insufficient documentation

## 2017-05-01 DIAGNOSIS — C7800 Secondary malignant neoplasm of unspecified lung: Secondary | ICD-10-CM | POA: Diagnosis not present

## 2017-05-01 MED ORDER — IOPAMIDOL (ISOVUE-300) INJECTION 61%
100.0000 mL | Freq: Once | INTRAVENOUS | Status: AC | PRN
Start: 2017-05-01 — End: 2017-05-01
  Administered 2017-05-01: 100 mL via INTRAVENOUS

## 2017-05-04 ENCOUNTER — Ambulatory Visit (HOSPITAL_COMMUNITY): Payer: Medicare Other

## 2017-05-04 ENCOUNTER — Other Ambulatory Visit (HOSPITAL_COMMUNITY): Payer: Self-pay | Admitting: *Deleted

## 2017-05-04 DIAGNOSIS — C20 Malignant neoplasm of rectum: Secondary | ICD-10-CM

## 2017-05-04 NOTE — Progress Notes (Signed)
Thank you :)

## 2017-05-06 ENCOUNTER — Other Ambulatory Visit: Payer: Self-pay

## 2017-05-06 ENCOUNTER — Encounter (HOSPITAL_COMMUNITY): Payer: Self-pay | Admitting: Hematology and Oncology

## 2017-05-06 ENCOUNTER — Inpatient Hospital Stay (HOSPITAL_COMMUNITY): Payer: Medicare Other | Attending: Hematology and Oncology

## 2017-05-06 ENCOUNTER — Inpatient Hospital Stay (HOSPITAL_BASED_OUTPATIENT_CLINIC_OR_DEPARTMENT_OTHER): Payer: Medicare Other | Admitting: Hematology and Oncology

## 2017-05-06 ENCOUNTER — Inpatient Hospital Stay (HOSPITAL_COMMUNITY): Payer: Medicare Other

## 2017-05-06 VITALS — BP 118/63 | HR 63 | Temp 97.7°F | Resp 18 | Wt 211.4 lb

## 2017-05-06 DIAGNOSIS — C20 Malignant neoplasm of rectum: Secondary | ICD-10-CM | POA: Diagnosis not present

## 2017-05-06 DIAGNOSIS — Z5112 Encounter for antineoplastic immunotherapy: Secondary | ICD-10-CM | POA: Insufficient documentation

## 2017-05-06 DIAGNOSIS — C7800 Secondary malignant neoplasm of unspecified lung: Secondary | ICD-10-CM | POA: Diagnosis not present

## 2017-05-06 DIAGNOSIS — C7801 Secondary malignant neoplasm of right lung: Secondary | ICD-10-CM

## 2017-05-06 DIAGNOSIS — K521 Toxic gastroenteritis and colitis: Secondary | ICD-10-CM

## 2017-05-06 DIAGNOSIS — C7889 Secondary malignant neoplasm of other digestive organs: Secondary | ICD-10-CM | POA: Insufficient documentation

## 2017-05-06 LAB — URINALYSIS, DIPSTICK ONLY
Bilirubin Urine: NEGATIVE
Glucose, UA: 150 mg/dL — AB
Hgb urine dipstick: NEGATIVE
Ketones, ur: NEGATIVE mg/dL
Leukocytes, UA: NEGATIVE
Nitrite: NEGATIVE
Protein, ur: NEGATIVE mg/dL
Specific Gravity, Urine: 1.021 (ref 1.005–1.030)
pH: 5 (ref 5.0–8.0)

## 2017-05-06 LAB — COMPREHENSIVE METABOLIC PANEL
ALT: 24 U/L (ref 17–63)
AST: 22 U/L (ref 15–41)
Albumin: 3.3 g/dL — ABNORMAL LOW (ref 3.5–5.0)
Alkaline Phosphatase: 64 U/L (ref 38–126)
Anion gap: 10 (ref 5–15)
BUN: 11 mg/dL (ref 6–20)
CO2: 22 mmol/L (ref 22–32)
Calcium: 8.5 mg/dL — ABNORMAL LOW (ref 8.9–10.3)
Chloride: 107 mmol/L (ref 101–111)
Creatinine, Ser: 0.74 mg/dL (ref 0.61–1.24)
GFR calc Af Amer: >60 mL/min (ref >60)
GFR calc non Af Amer: >60 mL/min (ref >60)
Glucose, Bld: 223 mg/dL — ABNORMAL HIGH (ref 65–99)
Potassium: 4.2 mmol/L (ref 3.5–5.1)
Sodium: 139 mmol/L (ref 135–145)
Total Bilirubin: 1 mg/dL (ref 0.3–1.2)
Total Protein: 6.1 g/dL — ABNORMAL LOW (ref 6.5–8.1)

## 2017-05-06 LAB — CBC WITH DIFFERENTIAL/PLATELET
Basophils Absolute: 0 10*3/uL (ref 0.0–0.1)
Basophils Relative: 0 %
Eosinophils Absolute: 0.2 10*3/uL (ref 0.0–0.7)
Eosinophils Relative: 3 %
HCT: 38.9 % — ABNORMAL LOW (ref 39.0–52.0)
Hemoglobin: 12.3 g/dL — ABNORMAL LOW (ref 13.0–17.0)
Lymphocytes Relative: 31 %
Lymphs Abs: 1.6 10*3/uL (ref 0.7–4.0)
MCH: 29.8 pg (ref 26.0–34.0)
MCHC: 31.6 g/dL (ref 30.0–36.0)
MCV: 94.2 fL (ref 78.0–100.0)
Monocytes Absolute: 0.4 10*3/uL (ref 0.1–1.0)
Monocytes Relative: 8 %
Neutro Abs: 3 10*3/uL (ref 1.7–7.7)
Neutrophils Relative %: 58 %
Platelets: 168 10*3/uL (ref 150–400)
RBC: 4.13 MIL/uL — ABNORMAL LOW (ref 4.22–5.81)
RDW: 15.1 % (ref 11.5–15.5)
WBC: 5.1 10*3/uL (ref 4.0–10.5)

## 2017-05-06 MED ORDER — BEVACIZUMAB CHEMO INJECTION 400 MG/16ML
500.0000 mg | Freq: Once | INTRAVENOUS | Status: AC
  Administered 2017-05-06: 500 mg via INTRAVENOUS
  Filled 2017-05-06: qty 20

## 2017-05-06 MED ORDER — LEUCOVORIN CALCIUM INJECTION 350 MG
650.0000 mg | Freq: Once | INTRAVENOUS | Status: AC
  Administered 2017-05-06: 650 mg via INTRAVENOUS
  Filled 2017-05-06: qty 32.5

## 2017-05-06 MED ORDER — FLUOROURACIL CHEMO INJECTION 2.5 GM/50ML
300.0000 mg/m2 | Freq: Once | INTRAVENOUS | Status: AC
  Administered 2017-05-06: 650 mg via INTRAVENOUS
  Filled 2017-05-06: qty 13

## 2017-05-06 MED ORDER — DIPHENHYDRAMINE HCL 50 MG/ML IJ SOLN
50.0000 mg | Freq: Once | INTRAMUSCULAR | Status: AC
  Administered 2017-05-06: 50 mg via INTRAVENOUS

## 2017-05-06 MED ORDER — DEXAMETHASONE SODIUM PHOSPHATE 10 MG/ML IJ SOLN
10.0000 mg | Freq: Once | INTRAMUSCULAR | Status: AC
  Administered 2017-05-06: 10 mg via INTRAVENOUS

## 2017-05-06 MED ORDER — SODIUM CHLORIDE 0.9 % IV SOLN
INTRAVENOUS | Status: DC
  Administered 2017-05-06: 11:00:00 via INTRAVENOUS

## 2017-05-06 MED ORDER — SODIUM CHLORIDE 0.9 % IV SOLN
1800.0000 mg/m2 | INTRAVENOUS | Status: DC
  Administered 2017-05-06: 3850 mg via INTRAVENOUS
  Filled 2017-05-06: qty 77

## 2017-05-06 MED ORDER — SODIUM CHLORIDE 0.9% FLUSH
10.0000 mL | INTRAVENOUS | Status: DC | PRN
  Administered 2017-05-06: 10 mL
  Filled 2017-05-06: qty 10

## 2017-05-06 MED ORDER — PALONOSETRON HCL INJECTION 0.25 MG/5ML
0.2500 mg | Freq: Once | INTRAVENOUS | Status: AC
  Administered 2017-05-06: 0.25 mg via INTRAVENOUS

## 2017-05-06 NOTE — Progress Notes (Signed)
MD reviewed labs, proceed with treatment per MD.  Continuous 5FU pump connected per protocol.   Treatment given per orders. Patient tolerated it well without problems. Vitals stable and discharged home from clinic ambulatory. Follow up as scheduled.

## 2017-05-06 NOTE — Patient Instructions (Signed)
[facility] Discharge Instructions for Patients Receiving Chemotherapy   Beginning January 23rd 2017 lab work for the Cancer Center will be done in the  Main lab at [place] on 1st floor. If you have a lab appointment with the Cancer Center please come in thru the  Main Entrance and check in at the main information desk   Today you received the following chemotherapy agents   To help prevent nausea and vomiting after your treatment, we encourage you to take your nausea medication     If you develop nausea and vomiting, or diarrhea that is not controlled by your medication, call the clinic.  The clinic phone number is [phone]. Office hours are Monday-Friday 8:30am-5:00pm.  BELOW ARE SYMPTOMS THAT SHOULD BE REPORTED IMMEDIATELY:  *FEVER GREATER THAN 101.0 F  *CHILLS WITH OR WITHOUT FEVER  NAUSEA AND VOMITING THAT IS NOT CONTROLLED WITH YOUR NAUSEA MEDICATION  *UNUSUAL SHORTNESS OF BREATH  *UNUSUAL BRUISING OR BLEEDING  TENDERNESS IN MOUTH AND THROAT WITH OR WITHOUT PRESENCE OF ULCERS  *URINARY PROBLEMS  *BOWEL PROBLEMS  UNUSUAL RASH Items with * indicate a potential emergency and should be followed up as soon as possible. If you have an emergency after office hours please contact your primary care physician or go to the nearest emergency department.  Please call the clinic during office hours if you have any questions or concerns.   You may also contact the Patient Navigator at [phone] should you have any questions or need assistance in obtaining follow up care.      Resources For Cancer Patients and their Caregivers ? American Cancer Society: Can assist with transportation, wigs, general needs, runs Look Good Feel Better.        1-888-227-6333 ? Cancer Care: Provides financial assistance, online support groups, medication/co-pay assistance.  1-800-813-HOPE (4673) ? [facility] Assists [place] cancer  patients and their families through emotional , educational and financial support.  336-427-4357 ? [place] DSS Where to apply for food stamps, Medicaid and utility assistance. 336-342-1394 ? [employer]: Transportation to medical appointments. 336-347-2287 ? Social Security Administration: May apply for disability if have a Stage IV cancer. 336-342-7796 1-800-772-1213 ? [place] Aging, Disability and Transit Services: Assists with nutrition, care and transit needs. 336-349-2343         

## 2017-05-08 ENCOUNTER — Other Ambulatory Visit: Payer: Self-pay

## 2017-05-08 ENCOUNTER — Encounter (HOSPITAL_COMMUNITY): Payer: Self-pay

## 2017-05-08 ENCOUNTER — Inpatient Hospital Stay (HOSPITAL_BASED_OUTPATIENT_CLINIC_OR_DEPARTMENT_OTHER): Payer: Medicare Other

## 2017-05-08 VITALS — BP 127/76 | HR 78 | Temp 98.0°F | Resp 20

## 2017-05-08 DIAGNOSIS — C20 Malignant neoplasm of rectum: Secondary | ICD-10-CM

## 2017-05-08 MED ORDER — HEPARIN SOD (PORK) LOCK FLUSH 100 UNIT/ML IV SOLN
500.0000 [IU] | Freq: Once | INTRAVENOUS | Status: AC | PRN
  Administered 2017-05-08: 500 [IU]

## 2017-05-08 MED ORDER — SODIUM CHLORIDE 0.9% FLUSH
10.0000 mL | INTRAVENOUS | Status: AC | PRN
  Administered 2017-05-08: 10 mL
  Filled 2017-05-08: qty 10

## 2017-05-15 NOTE — Assessment & Plan Note (Signed)
67 y.o. 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  Currently undergoing systemic palliative therapy with 5-FU and bevacizumab with oxaliplatin previously stopped due to progressive neuropathy.  Currently, patient is tolerating treatment well with grade 1 diarrhea, no nausea.  No other noticeable complications of the treatment.  Restaging assessment with CT of the chest/abdomen/pelvis done on 05/01/17 demonstrates stable disease.  Clinical evaluation lab work today permissive to continue treatment as scheduled.  Plan: -- Proceed with 5-FU and bevacizumab today -- Return to clinic in 2 weeks with labs and next round of systemic chemoimmunotherapy. -- Negative disease assessment with CT of the chest/abdomen/pelvis in late March 2019

## 2017-05-15 NOTE — Progress Notes (Signed)
[facility] Follow-up Visit:  Assessment: Rectal cancer 67 y.o. 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  Currently undergoing systemic palliative therapy with 5-FU and bevacizumab with oxaliplatin previously stopped due to progressive neuropathy.  Currently, patient is tolerating treatment well with grade 1 diarrhea, no nausea.  No other noticeable complications of the treatment.  Restaging assessment with CT of the chest/abdomen/pelvis done on 05/01/17 demonstrates stable disease.  Clinical evaluation lab work today permissive to continue treatment as scheduled.  Plan: -- Proceed with 5-FU and bevacizumab today -- Return to clinic in 2 weeks with labs and next round of systemic chemoimmunotherapy. -- Negative disease assessment with CT of the chest/abdomen/pelvis in late March 2019  Voice recognition software was used and Agricultural engineer of this note. Despite my best effort at editing the text, some misspelling/errors may have occurred.  Orders Placed This Encounter  Procedures  . CBC with Differential    Standing Status:   Future    Standing Expiration Date:   05/06/2018  . Comprehensive metabolic panel    Standing Status:   Future    Standing Expiration Date:   05/06/2018  . Magnesium    Standing Status:   Future    Standing Expiration Date:   05/06/2018  . Urinalysis with microscopic    Standing Status:   Future    Standing Expiration Date:   05/06/2018    Cancer Staging Rectal cancer [facility]) Staging form: Colon and Rectum, AJCC 7th Edition - Clinical: Stage IIIB (T3, N1, M0) - Signed by [name], MD on 02/04/2011 Prognostic indicators: EGFR status currently unknown    - Pathologic stage from 11/17/2014: Stage IVA (M1a) - Signed by B[facility] Cancer, PA-C on 09/14/2015 Prognostic indicators: EGFR status currently unknown      All questions were answered.  . The patient knows to call the clinic with any problems,  questions or concerns.  This note was electronically signed.    History of Presenting Illness Charles [name] 67 y.o. presenting to the [facility] for clinical evaluation and monitoring for treatment toxicity while receiving palliative therapy with 5-FU and bevacizumab for diagnosis of adenocarcinoma of the rectum with metastasis to lungs and pancreas.  In the interim, patient had restaging imaging which is outlined below.  He denies any new complaints.  Continues to have no nausea, but chronic diarrhea without hematochezia or melena.  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  No excessive bruising, epistaxis, hemoptysis, or hematemesis.  No hematuria..  Oncological/hematological History:   Rectal cancer ([place])   07/24/2010 Initial Diagnosis    Invasive adenocarcinoma of rectum      09/09/2010 Concurrent Chemotherapy    S/P radiation and concurrent 5-FU continuous infusion from 09/09/10- 10/10/10.      11/14/2010 Surgery    S/P proctectomy with colorectal anastomosis and diverting loop ileostomy on 11/14/10 at [facility] by Dr. Harlon Ditty. Pathology reveals a pT3b N1 with 3/20 lymph nodes.      02/05/2011 - 07/14/2011 Chemotherapy    FOLFOX      08/18/2011 Surgery    Approximate date of surgery- [place] by Dr. Harlon Ditty       Remission         11/24/2013 Survivorship    Colonoscopy- somewhat fibrotic/friable anastomotic mucosal-status post biopsy (narrowing not felt to be clinically significant).  Negative pathology for malignancy  09/12/2014 Imaging    DVT in the left femoral venous system, left common iliac vein, IVC, and within the IVC filter Right lower extremity venography confirms chronic occlusion of the femoral venous system with collateralization. The right iliac venous system is patent and do      09/25/2014 PET scan    The right middle lobe pulmonary nodule is hypermetabolic, favored to represent a primary  bronchogenic carcinoma.Equivocal mediastinal nodes, similar to surrounding blood pool. Bilateral adrenal hypermetabolism, felt to be physiologic      10/24/2014 Pathology Results    Lung, needle/core biopsy(ies), RML - ADENOCARCINOMA, SEE COMMENT metastatic adenocarcinoma of a colorectal primary      10/24/2014 Relapse/Recurrence         11/16/2014 Definitive Surgery    Bronchoscopy, right video-assisted thoracoscopy, wedge resection of right middle lobe by Dr. Servando Snare      11/16/2014 Pathology Results    Lung, wedge biopsy/resection, right lingula and small portion of middle lobe - METASTATIC ADENOCARCINOMA, CONSISTENT WITH COLORECTAL PRIMARY, SPANNING 2.0 CM. - THE SURGICAL RESECTION MARGINS ARE NEGATIVE FOR ADENOCARCINOMA.      11/16/2014 Remission    THE SURGICAL RESECTION MARGINS ARE NEGATIVE FOR ADENOCARCINOMA.      03/07/2015 Imaging    CT CAP- Interval resection of right middle lobe metastasis. No acute process or evidence of metastatic disease in the chest, abdomen or pelvis. Improved right upper lobe reticular nodular opacities are favored to represent resolving infection.      09/14/2015 Imaging    CT CAP- No findings of recurrent malignancy. No recurrence along the wedge resection site of the right middle lobe. Right anterior abdominal wall focal hernia containing a knuckle of small bowel without complicating feature.      06/13/2016 Imaging    CT CAP- 1. New right-sided pleural metastasis/mass. Other smaller right-sided pulmonary nodules which are all pleural-based and most likely represent pleural metastasis. 2. No convincing evidence of abdominopelvic nodal metastasis. 3. Constellation of findings, including pancreatic atrophy, duct dilatation, and pancreatic head soft tissue fullness which are highly suspicious for pancreatic adenocarcinoma. Metastatic disease felt much less likely. Consider endoscopic ultrasound sampling or ERCP. Cannot exclude superimposed acute  pancreatitis. 4. New enlargement of the appendiceal tip with subtle surrounding edema. Cannot exclude early or mild appendicitis. 5. Coronary artery atherosclerosis. Aortic atherosclerosis. 6. 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      06/13/2016 Progression    CT scan demonstrates progression of disease      06/19/2016 Procedure    EUS with FNA by Dr. Ardis Hughs      06/20/2016 Pathology Results    FINE NEEDLE ASPIRATION, ENDOSCOPIC, PANCREAS UNCINATE AREA(SPECIMEN 1 OF 1 COLLECTED 06/19/16): MALIGNANT CELLS CONSISTENT WITH METASTATIC ADENOCARCINOMA.      06/25/2016 PET scan    1. Two pleural-based nodules in the right hemithorax are hypermetabolic. Metastatic disease is a distinct consideration. The scattered pulmonary parenchymal nodule seen on previous diagnostic CT imaging are below the threshold for reliable resolution on PET imaging. 2. Hypermetabolic lesion pancreatic head, consistent with neoplasm. As noted on prior CT, adenocarcinoma is any consideration. No evidence for hypermetabolic abdominal lymphadenopathy. Mottled uptake noted in the liver, but no discrete hepatic metastases are evident on PET imaging. 3. Appendix remains distended up to 0.9-10 mm diameter with a stone towards the tip in subtle periappendiceal edema/inflammation. The appendix is hypermetabolic along its length. Imaging features are relatively stable in the 12 day interval since prior CT scan. While appendicitis is a  consideration, the relative stability over 12 days would be unusual for that etiology. Continued close follow-up recommended.      06/26/2016 Pathology Results    Not enough tissue for foundationONE or K-ras testing.      07/07/2016 Procedure    Port placed by Dr. Arnoldo Morale      07/08/2016 -  Chemotherapy    The patient had pegfilgrastim (NEULASTA) injection 6 mg, 6 mg, Subcutaneous,  Once, 0 of 4 cycles  ondansetron (ZOFRAN) IVPB 8 mg, 8 mg, Intravenous,   Once, 0 of 4 cycles  leucovorin 804 mg in dextrose 5 % 250 mL infusion, 400 mg/m2, Intravenous,  Once, 0 of 4 cycles  oxaliplatin (ELOXATIN) 170 mg in dextrose 5 % 500 mL chemo infusion, 85 mg/m2, Intravenous,  Once, 0 of 4 cycles  fluorouracil (ADRUCIL) 4,800 mg in sodium chloride 0.9 % 150 mL chemo infusion, 2,400 mg/m2 = 4,800 mg, Intravenous, 1 Day/Dose, 0 of 4 cycles  fluorouracil (ADRUCIL) chemo injection 800 mg, 400 mg/m2, Intravenous,  Once, 0 of 4 cycles  pegfilgrastim (NEULASTA) injection 6 mg, 6 mg, Subcutaneous,  Once, 2 of 2 cycles  ondansetron (ZOFRAN) IVPB 8 mg, 8 mg, Intravenous,  Once, 12 of 12 cycles  leucovorin 660 mg in dextrose 5 % 250 mL infusion, 660 mg (original dose ), Intravenous,  Once, 12 of 12 cycles Dose modification: 660 mg (Cycle 1)  oxaliplatin (ELOXATIN) 140 mg in dextrose 5 % 500 mL chemo infusion, 140 mg (original dose ), Intravenous,  Once, 12 of 12 cycles Dose modification: 140 mg (Cycle 1), 119 mg (85 % of original dose 140 mg, Cycle 8, Reason: Provider Judgment)  fluorouracil (ADRUCIL) 3,850 mg in sodium chloride 0.9 % 150 mL chemo infusion, 3,850 mg (original dose ), Intravenous, 1 Day/Dose, 12 of 12 cycles Dose modification: 3,850 mg (Cycle 1)  fluorouracil (ADRUCIL) chemo injection 700 mg, 700 mg (original dose ), Intravenous,  Once, 12 of 12 cycles Dose modification: 700 mg (Cycle 1)  palonosetron (ALOXI) injection 0.25 mg, 0.25 mg, Intravenous,  Once, 7 of 12 cycles Administration: 0.25 mg (09/30/2016)  bevacizumab (AVASTIN) 475 mg in sodium chloride 0.9 % 100 mL chemo infusion, 5 mg/kg = 475 mg, Intravenous,  Once, 6 of 11 cycles Administration: 500 mg (09/30/2016)  leucovorin 880 mg in dextrose 5 % 250 mL infusion, 400 mg/m2 = 880 mg, Intravenous,  Once, 7 of 12 cycles Administration: 880 mg (09/30/2016)  oxaliplatin (ELOXATIN) 185 mg in dextrose 5 % 500 mL chemo infusion, 85 mg/m2 = 185 mg, Intravenous,  Once, 7 of 12  cycles Administration: 185 mg (09/30/2016)  fluorouracil (ADRUCIL) chemo injection 900 mg, 400 mg/m2 = 900 mg, Intravenous,  Once, 7 of 12 cycles Administration: 900 mg (09/30/2016)  fluorouracil (ADRUCIL) 5,300 mg in sodium chloride 0.9 % 144 mL chemo infusion, 2,400 mg/m2 = 5,300 mg, Intravenous, 1 Day/Dose, 7 of 12 cycles Administration: 5,300 mg (09/30/2016)  for chemotherapy treatment.        08/27/2016 Imaging    CT CAP- 1. Pulmonary metastatic disease is stable. 2. Pancreatic head mass, grossly stable, with progressive atrophy of the body and tail of the pancreas. Stable peripancreatic lymph node. 3. Mild circumferential rectal wall thickening, stable. 4. Aortic atherosclerosis (ICD10-170.0). Coronary artery calcification. 5. Hyperattenuating lesion off the lower pole left kidney, stable, too small to characterize. Continued attention on followup exams is warranted. 6. Persistent wall thickening and mild dilatation of the proximal appendix, of uncertain etiology. Continued attention on followup exams is  warranted.      11/06/2016 Imaging    CT C/A/P: IMPRESSION: 1. Stable pulmonary metastatic disease. 2. Similar appearing pancreatic head mass. Progressed atrophy of the pancreatic body and tail. Stable peripancreatic lymph node. 3. Stable hypoattenuating lesion partially exophytic off the inferior pole of the left kidney. 4. Aortic atherosclerosis. 5. Persistent mild wall thickening and mild dilatation of the appendix.       01/30/2017 Imaging    Ct C/A/P: IMPRESSION: 1. Stable pulmonary metastatic disease. 2. Stable to slight increase in size of pancreatic head mass. 3.  Aortic Atherosclerosis (ICD10-I70.0). 4. Similar appearance of mild wall thickening of the appendix which contains an appendicolith.      05/01/2017 Imaging    CT C/A/P: Stable disease in the lungs, no progression of disease in the abdomen.       Medical History: Past Medical History:   Diagnosis Date  . Chronic anticoagulation   . Colon cancer ([place]) 07/24/2010   rectal ca, inv adenocarcinoma  . Depression 04/01/2011  . Diabetes mellitus without complication ([place])   . DVT (deep venous thrombosis) ([place]) 05/09/2011  . GERD (gastroesophageal reflux disease)   . High output ileostomy ([place]) 05/21/2011  . History of kidney stones   . HTN (hypertension)   . Hx of radiation therapy 09/02/10 to 10/14/10   pelvis  . Lung metastasis ([place])   . Neuropathy   . Peripheral vascular disease ([place])    dvt's,pe  . Pneumonia    hx x3  . Pulmonary embolism ([place])   . Rectal cancer ([place]) 01/15/2011   S/P radiation and concurrent 5-FU continuous infusion from 09/09/10- 10/10/10.  S/P proctectomy with colorectal anastomosis and diverting loop ileostomy on 11/14/10 at [facility] by Dr. Harlon Ditty. Pathology reveals a pT3b N1 with 3/20 lymph nodes.      Surgical History: Past Surgical History:  Procedure Laterality Date  . COLON SURGERY  11/14/2010   proctectomy with colorectal anastomosis and diverting loop ileostomy (temporary planned)  . COLONOSCOPY  07/2010   proximal rectal apple core mass 10-14cm from anal verge (adenocarcinoma), 2-3cm distal rectal carpet polyp s/p piecemeal snare polypectomy (adenoma)  . COLONOSCOPY  04/21/2012   RMR: Friable,fibrotic appearing colorectal anastomosis producing some luminal narrowing-not felt to be critical. path: focal erosion with slight inflammation and hyperemia. SURVEILLANCE DUE DEC 2015  . COLONOSCOPY N/A 11/24/2013   Dr. Rourk:somewhat fibrotic/friable anastomotic mucosal-status post biopsy (narrowing not felt to be clinically significant) Single colonic diverticulum. benign polypoid rectal mucosa  . colostomy reversal  april 2013  . ESOPHAGOGASTRODUODENOSCOPY  07/2010   RMR: schatki ring s/p dilation, small hh, SB bx benign  . ESOPHAGOGASTRODUODENOSCOPY N/A 09/04/2015   Procedure: ESOPHAGOGASTRODUODENOSCOPY (EGD);  Surgeon: Daneil Dolin, MD;   Location: [facility] END[facility]ITE;  Service: Endoscopy;  Laterality: N/A;  730  . EUS  08/2010   Dr. Owens Loffler. uT3N0 circumferential, nearly obstruction rectosigmoid adenocarcinoma, distal edge 12cm from anal verge  . EUS N/A 06/19/2016   Procedure: UPPER ENDOSCOPIC ULTRASOUND (EUS) RADIAL;  Surgeon: Milus Banister, MD;  Location: [facility] ENDOSCOPY;  Service: Endoscopy;  Laterality: N/A;  . HERNIA REPAIR     abd hernia repair  . IVC filter    . ivc filter    . port a cath placement    . PORT-A-CATH REMOVAL  09/24/2011   Procedure: REMOVAL PORT-A-CATH;  Surgeon: Donato Heinz, MD;  Location: [facility] ORS;  Service: General;  Laterality: N/A;  Minor Room  . PORTACATH PLACEMENT Right 07/07/2016  Procedure: INSERTION PORT-A-CATH;  Surgeon: Aviva Signs, MD;  Location: [facility] ORS;  Service: General;  Laterality: Right;  Marland Kitchen VIDEO ASSISTED THORACOSCOPY (VATS)/WEDGE RESECTION Right 11/15/2014   Procedure: VIDEO ASSISTED THORACOSCOPY (VATS)/LUNG RESECTION WITH RIGHT LINGULECTOMY;  Surgeon: [name], MD;  Location: [facility];  Service: Thoracic;  Laterality: Right;  Marland Kitchen VIDEO BRONCHOSCOPY N/A 11/15/2014   Procedure: VIDEO BRONCHOSCOPY;  Surgeon: [name], MD;  Location: [facility] OR;  Service: Thoracic;  Laterality: N/A;    Family History: Family History  Problem Relation Age of Onset  . Cancer Brother        throat  . Cancer Brother        prostate  . Colon cancer Neg Hx   . Liver disease Neg Hx   . Inflammatory bowel disease Neg Hx     Social History: Social History   Socioeconomic History  . Marital status: Married    Spouse name: Not on file  . Number of children: 2  . Years of education: Not on file  . Highest education level: Not on file  Social Needs  . Financial resource strain: Not on file  . Food insecurity - worry: Not on file  . Food insecurity - inability: Not on file  . Transportation needs - medical: Not on file  . Transportation needs - non-medical: Not on file  Occupational History   . Occupation: self-employed Regulatory affairs officer, currently not working    Fish farm manager: SELF EMPLOYED  Tobacco Use  . Smoking status: Never Smoker  . Smokeless tobacco: Never Used  Substance and Sexual Activity  . Alcohol use: No  . Drug use: No  . Sexual activity: Yes    Birth control/protection: None    Comment: married  Other Topics Concern  . Not on file  Social History Narrative  . Not on file    Allergies: Allergies  Allergen Reactions  . Oxycodone     Blisters, hallucinations  . Tramadol     Blisters, hallucinations  . Trazodone And Nefazodone Other (See Comments)    hallucinations    Medications:  Current Outpatient Medications  Medication Sig Dispense Refill  . acetaminophen (TYLENOL) 500 MG tablet Take 1,000 mg by mouth every 6 (six) hours as needed.    . clonazePAM (KLONOPIN) 1 MG tablet Take 1 tablet (1 mg total) by mouth 2 (two) times daily as needed for anxiety. 60 tablet 0  . dexamethasone (DECADRON) 4 MG tablet Take 2 tablets (8 mg total) by mouth daily. Start the day after chemotherapy for 2 days. Take with food. 30 tablet 1  . Diphenhyd-Hydrocort-Nystatin (FIRST-DUKES MOUTHWASH) SUSP Use as directed 5 mLs 4 (four) times daily in the mouth or throat. 360 mL 3  . diphenoxylate-atropine (LOMOTIL) 2.5-0.025 MG tablet Take 1 tablet by mouth 4 (four) times daily as needed for diarrhea or loose stools. 60 tablet 1  . dronabinol (MARINOL) 2.5 MG capsule Take 1 capsule (2.5 mg total) by mouth 2 (two) times daily before a meal. 60 capsule 1  . fluorouracil CALGB 20100 in sodium chloride 0.9 % 150 mL Inject into the vein over 96 hr.    . gabapentin (NEURONTIN) 300 MG capsule TAKE 3 CAPSULES BY MOUTH TWICE DAILY 540 capsule 1  . glimepiride (AMARYL) 2 MG tablet Take 2 mg by mouth daily with breakfast.     . HYDROcodone-acetaminophen (NORCO/VICODIN) 5-325 MG tablet Take 1-2 tablets by mouth every 6 (six) hours as needed for moderate pain. 75 tablet 0  . leucovorin  in dextrose 5 % 250 mL Inject into the vein once.    Marland Kitchen levofloxacin (LEVAQUIN) 500 MG tablet Take 1 tablet (500 mg total) by mouth daily. 7 tablet 0  . lisinopril (PRINIVIL,ZESTRIL) 10 MG tablet     . mirtazapine (REMERON) 7.5 MG tablet Take 7.5 mg by mouth at bedtime.    Marland Kitchen omeprazole (PRILOSEC) 20 MG capsule Take 20 mg by mouth 2 (two) times daily before a meal.     . ondansetron (ZOFRAN) 8 MG tablet Take 1 tablet (8 mg total) by mouth 2 (two) times daily as needed for refractory nausea / vomiting. Start on day 3 after chemotherapy. 30 tablet 1  . OXALIPLATIN IV Inject into the vein.    . potassium chloride SA (K-DUR,KLOR-CON) 20 MEQ tablet Take 1 tablet (20 mEq total) by mouth 2 (two) times daily. 60 tablet 3  . prochlorperazine (COMPAZINE) 10 MG tablet Take 1 tablet (10 mg total) by mouth every 6 (six) hours as needed (Nausea or vomiting). 30 tablet 1  . psyllium (REGULOID) 0.52 g capsule Take 0.52 g by mouth daily.    . rivaroxaban (XARELTO) 20 MG TABS tablet Take 1 tablet (20 mg total) by mouth daily with supper. 30 tablet 3  . tamsulosin (FLOMAX) 0.4 MG CAPS capsule Take 0.4 mg by mouth at bedtime.     . traZODone (DESYREL) 100 MG tablet Take 100 mg by mouth at bedtime.    Marland Kitchen zinc oxide 20 % ointment Apply 1 application topically as needed for irritation. 56.7 g 1   No current facility-administered medications for this visit.    Facility-Administered Medications Ordered in Other Visits  Medication Dose Route Frequency Provider Last Rate Last Dose  . sodium chloride 0.9 % injection 10 mL  10 mL Intravenous PRN Everardo All, MD   10 mL at 08/22/11 1114  . sodium chloride flush (NS) 0.9 % injection 10 mL  10 mL Intracatheter PRN Ardath Sax, MD   10 mL at 05/08/17 1127    Review of Systems: Review of Systems  All other systems reviewed and are negative.    PHYSICAL EXAMINATION There were no vitals taken for this visit.  ECOG PERFORMANCE STATUS: 0 - Asymptomatic  Physical  Exam  Constitutional: He is oriented to person, place, and time and well-developed, well-nourished, and in no distress. No distress.  HENT:  Head: Normocephalic and atraumatic.  Mouth/Throat: Oropharynx is clear and moist. No oropharyngeal exudate.  Eyes: Conjunctivae and EOM are normal. Pupils are equal, round, and reactive to light. No scleral icterus.  Neck: No thyromegaly present.  Cardiovascular: Normal rate, regular rhythm and normal heart sounds.  No murmur heard. Pulmonary/Chest: Effort normal and breath sounds normal. No respiratory distress. He has no wheezes. He has no rales. He exhibits no tenderness.  Abdominal: Soft. Bowel sounds are normal. He exhibits no distension. There is no tenderness. There is no rebound.  Musculoskeletal: He exhibits no edema.  Lymphadenopathy:    He has no cervical adenopathy.  Neurological: He is alert and oriented to person, place, and time. He has normal reflexes. No cranial nerve deficit.  Skin: Skin is warm and dry. No rash noted. He is not diaphoretic. No erythema.     LABORATORY DATA: I have personally reviewed the data as listed: Infusion on 05/06/2017  Component Date Value Ref Range Status  . Color, Urine 05/06/2017 YELLOW  YELLOW Final  . APPearance 05/06/2017 HAZY* CLEAR Final  . Specific Gravity, Urine 05/06/2017 1.021  1.005 -  1.030 Final  . pH 05/06/2017 5.0  5.0 - 8.0 Final  . Glucose, UA 05/06/2017 150* NEGATIVE mg/dL Final  . Hgb urine dipstick 05/06/2017 NEGATIVE  NEGATIVE Final  . Bilirubin Urine 05/06/2017 NEGATIVE  NEGATIVE Final  . Ketones, ur 05/06/2017 NEGATIVE  NEGATIVE mg/dL Final  . Protein, ur 05/06/2017 NEGATIVE  NEGATIVE mg/dL Final  . Nitrite 05/06/2017 NEGATIVE  NEGATIVE Final  . Leukocytes, UA 05/06/2017 NEGATIVE  NEGATIVE Final  . Sodium 05/06/2017 139  135 - 145 mmol/L Final  . Potassium 05/06/2017 4.2  3.5 - 5.1 mmol/L Final  . Chloride 05/06/2017 107  101 - 111 mmol/L Final  . CO2 05/06/2017 22  22 - 32  mmol/L Final  . Glucose, Bld 05/06/2017 223* 65 - 99 mg/dL Final  . BUN 05/06/2017 11  6 - 20 mg/dL Final  . Creatinine, Ser 05/06/2017 0.74  0.61 - 1.24 mg/dL Final  . Calcium 05/06/2017 8.5* 8.9 - 10.3 mg/dL Final  . Total Protein 05/06/2017 6.1* 6.5 - 8.1 g/dL Final  . Albumin 05/06/2017 3.3* 3.5 - 5.0 g/dL Final  . AST 05/06/2017 22  15 - 41 U/L Final  . ALT 05/06/2017 24  17 - 63 U/L Final  . Alkaline Phosphatase 05/06/2017 64  38 - 126 U/L Final  . Total Bilirubin 05/06/2017 1.0  0.3 - 1.2 mg/dL Final  . GFR calc non Af Amer 05/06/2017 >60  >60 mL/min Final  . GFR calc Af Amer 05/06/2017 >60  >60 mL/min Final   Comment: (NOTE) The eGFR has been calculated using the CKD EPI equation. This calculation has not been validated in all clinical situations. eGFR's persistently <60 mL/min signify possible Chronic Kidney Disease.   . Anion gap 05/06/2017 10  5 - 15 Final  Appointment on 05/06/2017  Component Date Value Ref Range Status  . WBC 05/06/2017 5.1  4.0 - 10.5 K/uL Final  . RBC 05/06/2017 4.13* 4.22 - 5.81 MIL/uL Final  . Hemoglobin 05/06/2017 12.3* 13.0 - 17.0 g/dL Final  . HCT 05/06/2017 38.9* 39.0 - 52.0 % Final  . MCV 05/06/2017 94.2  78.0 - 100.0 fL Final  . MCH 05/06/2017 29.8  26.0 - 34.0 pg Final  . MCHC 05/06/2017 31.6  30.0 - 36.0 g/dL Final  . RDW 05/06/2017 15.1  11.5 - 15.5 % Final  . Platelets 05/06/2017 168  150 - 400 K/uL Final  . Neutrophils Relative % 05/06/2017 58  % Final  . Neutro Abs 05/06/2017 3.0  1.7 - 7.7 K/uL Final  . Lymphocytes Relative 05/06/2017 31  % Final  . Lymphs Abs 05/06/2017 1.6  0.7 - 4.0 K/uL Final  . Monocytes Relative 05/06/2017 8  % Final  . Monocytes Absolute 05/06/2017 0.4  0.1 - 1.0 K/uL Final  . Eosinophils Relative 05/06/2017 3  % Final  . Eosinophils Absolute 05/06/2017 0.2  0.0 - 0.7 K/uL Final  . Basophils Relative 05/06/2017 0  % Final  . Basophils Absolute 05/06/2017 0.0  0.0 - 0.1 K/uL Final       Ardath Sax, MD

## 2017-05-19 ENCOUNTER — Encounter (HOSPITAL_COMMUNITY): Payer: Self-pay

## 2017-05-19 ENCOUNTER — Inpatient Hospital Stay (HOSPITAL_COMMUNITY): Payer: Medicare Other

## 2017-05-19 ENCOUNTER — Other Ambulatory Visit (HOSPITAL_COMMUNITY): Payer: Self-pay | Admitting: Hematology and Oncology

## 2017-05-19 VITALS — BP 134/74 | HR 69 | Temp 97.9°F | Resp 20 | Wt 208.8 lb

## 2017-05-19 DIAGNOSIS — Z5112 Encounter for antineoplastic immunotherapy: Secondary | ICD-10-CM | POA: Diagnosis not present

## 2017-05-19 DIAGNOSIS — C20 Malignant neoplasm of rectum: Secondary | ICD-10-CM

## 2017-05-19 DIAGNOSIS — C7889 Secondary malignant neoplasm of other digestive organs: Secondary | ICD-10-CM | POA: Diagnosis not present

## 2017-05-19 DIAGNOSIS — C7800 Secondary malignant neoplasm of unspecified lung: Secondary | ICD-10-CM | POA: Diagnosis not present

## 2017-05-19 LAB — COMPREHENSIVE METABOLIC PANEL
ALT: 33 U/L (ref 17–63)
AST: 31 U/L (ref 15–41)
Albumin: 3.6 g/dL (ref 3.5–5.0)
Alkaline Phosphatase: 74 U/L (ref 38–126)
Anion gap: 9 (ref 5–15)
BUN: 9 mg/dL (ref 6–20)
CO2: 23 mmol/L (ref 22–32)
Calcium: 9 mg/dL (ref 8.9–10.3)
Chloride: 107 mmol/L (ref 101–111)
Creatinine, Ser: 0.73 mg/dL (ref 0.61–1.24)
GFR calc Af Amer: >60 mL/min (ref >60)
GFR calc non Af Amer: >60 mL/min (ref >60)
Glucose, Bld: 237 mg/dL — ABNORMAL HIGH (ref 65–99)
Potassium: 4 mmol/L (ref 3.5–5.1)
Sodium: 139 mmol/L (ref 135–145)
Total Bilirubin: 0.8 mg/dL (ref 0.3–1.2)
Total Protein: 6.5 g/dL (ref 6.5–8.1)

## 2017-05-19 LAB — URINALYSIS, DIPSTICK ONLY
Bilirubin Urine: NEGATIVE
Glucose, UA: 150 mg/dL — AB
Hgb urine dipstick: NEGATIVE
Ketones, ur: NEGATIVE mg/dL
Leukocytes, UA: NEGATIVE
Nitrite: NEGATIVE
Protein, ur: NEGATIVE mg/dL
Specific Gravity, Urine: 1.019 (ref 1.005–1.030)
pH: 5 (ref 5.0–8.0)

## 2017-05-19 LAB — CBC WITH DIFFERENTIAL/PLATELET
Basophils Absolute: 0 10*3/uL (ref 0.0–0.1)
Basophils Relative: 1 %
Eosinophils Absolute: 0.2 10*3/uL (ref 0.0–0.7)
Eosinophils Relative: 3 %
HCT: 42.1 % (ref 39.0–52.0)
Hemoglobin: 13.1 g/dL (ref 13.0–17.0)
Lymphocytes Relative: 28 %
Lymphs Abs: 1.8 10*3/uL (ref 0.7–4.0)
MCH: 29.4 pg (ref 26.0–34.0)
MCHC: 31.1 g/dL (ref 30.0–36.0)
MCV: 94.4 fL (ref 78.0–100.0)
Monocytes Absolute: 0.4 10*3/uL (ref 0.1–1.0)
Monocytes Relative: 7 %
Neutro Abs: 4 10*3/uL (ref 1.7–7.7)
Neutrophils Relative %: 61 %
Platelets: 176 10*3/uL (ref 150–400)
RBC: 4.46 MIL/uL (ref 4.22–5.81)
RDW: 15.1 % (ref 11.5–15.5)
WBC: 6.4 10*3/uL (ref 4.0–10.5)

## 2017-05-19 LAB — MAGNESIUM: Magnesium: 1.6 mg/dL — ABNORMAL LOW (ref 1.7–2.4)

## 2017-05-19 MED ORDER — DEXAMETHASONE SODIUM PHOSPHATE 10 MG/ML IJ SOLN
INTRAMUSCULAR | Status: AC
  Filled 2017-05-19: qty 1

## 2017-05-19 MED ORDER — LEUCOVORIN CALCIUM INJECTION 350 MG
650.0000 mg | Freq: Once | INTRAMUSCULAR | Status: AC
  Administered 2017-05-19: 650 mg via INTRAVENOUS
  Filled 2017-05-19: qty 32.5

## 2017-05-19 MED ORDER — DIPHENHYDRAMINE HCL 50 MG/ML IJ SOLN
50.0000 mg | Freq: Once | INTRAMUSCULAR | Status: AC
  Administered 2017-05-19: 50 mg via INTRAVENOUS

## 2017-05-19 MED ORDER — FLUOROURACIL CHEMO INJECTION 2.5 GM/50ML
300.0000 mg/m2 | Freq: Once | INTRAVENOUS | Status: AC
  Administered 2017-05-19: 650 mg via INTRAVENOUS
  Filled 2017-05-19: qty 13

## 2017-05-19 MED ORDER — DEXAMETHASONE SODIUM PHOSPHATE 10 MG/ML IJ SOLN
10.0000 mg | Freq: Once | INTRAMUSCULAR | Status: AC
  Administered 2017-05-19: 10 mg via INTRAVENOUS

## 2017-05-19 MED ORDER — FLUOROURACIL CHEMO INJECTION 5 GM/100ML
1800.0000 mg/m2 | INTRAVENOUS | Status: DC
  Administered 2017-05-19: 3850 mg via INTRAVENOUS
  Filled 2017-05-19: qty 77

## 2017-05-19 MED ORDER — SODIUM CHLORIDE 0.9 % IV SOLN
500.0000 mg | Freq: Once | INTRAVENOUS | Status: AC
  Administered 2017-05-19: 500 mg via INTRAVENOUS
  Filled 2017-05-19: qty 4

## 2017-05-19 MED ORDER — PALONOSETRON HCL INJECTION 0.25 MG/5ML
0.2500 mg | Freq: Once | INTRAVENOUS | Status: AC
  Administered 2017-05-19: 0.25 mg via INTRAVENOUS

## 2017-05-19 MED ORDER — SODIUM CHLORIDE 0.9 % IV SOLN
INTRAVENOUS | Status: DC
  Administered 2017-05-19: 11:00:00 via INTRAVENOUS

## 2017-05-19 MED ORDER — PALONOSETRON HCL INJECTION 0.25 MG/5ML
INTRAVENOUS | Status: AC
  Filled 2017-05-19: qty 5

## 2017-05-19 MED ORDER — DIPHENHYDRAMINE HCL 50 MG/ML IJ SOLN
INTRAMUSCULAR | Status: AC
  Filled 2017-05-19: qty 1

## 2017-05-19 NOTE — Progress Notes (Signed)
Tolerated tx w/o adverse reaction.  Alert, in no distress.  VSS.  Discharged ambulatory in c/o spouse.

## 2017-05-20 ENCOUNTER — Ambulatory Visit (HOSPITAL_COMMUNITY): Payer: Medicare Other

## 2017-05-20 ENCOUNTER — Other Ambulatory Visit (HOSPITAL_COMMUNITY): Payer: Medicare Other

## 2017-05-21 ENCOUNTER — Encounter (HOSPITAL_COMMUNITY): Payer: Self-pay

## 2017-05-21 ENCOUNTER — Other Ambulatory Visit: Payer: Self-pay

## 2017-05-21 ENCOUNTER — Inpatient Hospital Stay (HOSPITAL_COMMUNITY): Payer: Medicare Other

## 2017-05-21 VITALS — BP 147/71 | HR 72 | Temp 98.1°F | Resp 18

## 2017-05-21 DIAGNOSIS — C7889 Secondary malignant neoplasm of other digestive organs: Secondary | ICD-10-CM | POA: Diagnosis not present

## 2017-05-21 DIAGNOSIS — C20 Malignant neoplasm of rectum: Secondary | ICD-10-CM

## 2017-05-21 DIAGNOSIS — C7800 Secondary malignant neoplasm of unspecified lung: Secondary | ICD-10-CM | POA: Diagnosis not present

## 2017-05-21 DIAGNOSIS — Z5112 Encounter for antineoplastic immunotherapy: Secondary | ICD-10-CM | POA: Diagnosis not present

## 2017-05-21 MED ORDER — HEPARIN SOD (PORK) LOCK FLUSH 100 UNIT/ML IV SOLN
INTRAVENOUS | Status: AC
  Filled 2017-05-21: qty 5

## 2017-05-21 MED ORDER — SODIUM CHLORIDE 0.9% FLUSH
10.0000 mL | INTRAVENOUS | Status: DC | PRN
  Administered 2017-05-21: 10 mL
  Filled 2017-05-21: qty 10

## 2017-05-21 MED ORDER — HEPARIN SOD (PORK) LOCK FLUSH 100 UNIT/ML IV SOLN
500.0000 [IU] | Freq: Once | INTRAVENOUS | Status: AC | PRN
  Administered 2017-05-21: 500 [IU]

## 2017-05-21 NOTE — Progress Notes (Signed)
Charles [name] presents to have home infusion pump d/c'd and for port-a-cath deaccess with flush.  Proper placement of portacath confirmed by CXR.  Portacath located right chest wall accessed with  H 20 needle.  Good blood return present. Portacath flushed with NS and 500U/74ml Heparin, and needle removed intact.  Procedure tolerated well and without incident.  Discharged ambulatory in c/o spouse.

## 2017-05-22 ENCOUNTER — Encounter (HOSPITAL_COMMUNITY): Payer: Medicare Other

## 2017-06-02 ENCOUNTER — Other Ambulatory Visit: Payer: Self-pay

## 2017-06-02 ENCOUNTER — Encounter (HOSPITAL_COMMUNITY): Payer: Self-pay

## 2017-06-02 ENCOUNTER — Inpatient Hospital Stay (HOSPITAL_COMMUNITY): Payer: Medicare Other

## 2017-06-02 ENCOUNTER — Inpatient Hospital Stay (HOSPITAL_COMMUNITY): Payer: Medicare Other | Admitting: Internal Medicine

## 2017-06-02 VITALS — BP 129/76 | HR 88 | Temp 98.6°F | Resp 16

## 2017-06-02 VITALS — BP 125/78 | HR 98 | Temp 97.6°F | Resp 16 | Wt 208.0 lb

## 2017-06-02 DIAGNOSIS — C20 Malignant neoplasm of rectum: Secondary | ICD-10-CM

## 2017-06-02 DIAGNOSIS — Z5112 Encounter for antineoplastic immunotherapy: Secondary | ICD-10-CM | POA: Diagnosis not present

## 2017-06-02 DIAGNOSIS — C7889 Secondary malignant neoplasm of other digestive organs: Secondary | ICD-10-CM | POA: Diagnosis not present

## 2017-06-02 DIAGNOSIS — C7800 Secondary malignant neoplasm of unspecified lung: Secondary | ICD-10-CM | POA: Diagnosis not present

## 2017-06-02 LAB — URINALYSIS, DIPSTICK ONLY
Bilirubin Urine: NEGATIVE
Glucose, UA: NEGATIVE mg/dL
Hgb urine dipstick: NEGATIVE
Ketones, ur: NEGATIVE mg/dL
Leukocytes, UA: NEGATIVE
Nitrite: NEGATIVE
Protein, ur: NEGATIVE mg/dL
Specific Gravity, Urine: 1.024 (ref 1.005–1.030)
pH: 5 (ref 5.0–8.0)

## 2017-06-02 LAB — CBC WITH DIFFERENTIAL/PLATELET
Basophils Absolute: 0 10*3/uL (ref 0.0–0.1)
Basophils Relative: 1 %
Eosinophils Absolute: 0.2 10*3/uL (ref 0.0–0.7)
Eosinophils Relative: 3 %
HCT: 41.2 % (ref 39.0–52.0)
Hemoglobin: 13.1 g/dL (ref 13.0–17.0)
Lymphocytes Relative: 32 %
Lymphs Abs: 1.8 10*3/uL (ref 0.7–4.0)
MCH: 29.7 pg (ref 26.0–34.0)
MCHC: 31.8 g/dL (ref 30.0–36.0)
MCV: 93.4 fL (ref 78.0–100.0)
Monocytes Absolute: 0.4 10*3/uL (ref 0.1–1.0)
Monocytes Relative: 7 %
Neutro Abs: 3.3 10*3/uL (ref 1.7–7.7)
Neutrophils Relative %: 57 %
Platelets: 199 10*3/uL (ref 150–400)
RBC: 4.41 MIL/uL (ref 4.22–5.81)
RDW: 14.8 % (ref 11.5–15.5)
WBC: 5.7 10*3/uL (ref 4.0–10.5)

## 2017-06-02 LAB — COMPREHENSIVE METABOLIC PANEL
ALT: 31 U/L (ref 17–63)
AST: 33 U/L (ref 15–41)
Albumin: 3.5 g/dL (ref 3.5–5.0)
Alkaline Phosphatase: 82 U/L (ref 38–126)
Anion gap: 10 (ref 5–15)
BUN: 13 mg/dL (ref 6–20)
CO2: 20 mmol/L — ABNORMAL LOW (ref 22–32)
Calcium: 8.7 mg/dL — ABNORMAL LOW (ref 8.9–10.3)
Chloride: 106 mmol/L (ref 101–111)
Creatinine, Ser: 0.81 mg/dL (ref 0.61–1.24)
GFR calc Af Amer: >60 mL/min (ref >60)
GFR calc non Af Amer: >60 mL/min (ref >60)
Glucose, Bld: 269 mg/dL — ABNORMAL HIGH (ref 65–99)
Potassium: 4.3 mmol/L (ref 3.5–5.1)
Sodium: 136 mmol/L (ref 135–145)
Total Bilirubin: 1 mg/dL (ref 0.3–1.2)
Total Protein: 6.3 g/dL — ABNORMAL LOW (ref 6.5–8.1)

## 2017-06-02 LAB — MAGNESIUM: Magnesium: 1.6 mg/dL — ABNORMAL LOW (ref 1.7–2.4)

## 2017-06-02 LAB — LACTATE DEHYDROGENASE: LDH: 167 U/L (ref 98–192)

## 2017-06-02 MED ORDER — SODIUM CHLORIDE 0.9 % IV SOLN
INTRAVENOUS | Status: DC
  Administered 2017-06-02: 10:00:00 via INTRAVENOUS

## 2017-06-02 MED ORDER — OCTREOTIDE ACETATE 30 MG IM KIT
PACK | INTRAMUSCULAR | Status: AC
  Filled 2017-06-02: qty 1

## 2017-06-02 MED ORDER — SODIUM CHLORIDE 0.9 % IV SOLN
500.0000 mg | Freq: Once | INTRAVENOUS | Status: AC
  Administered 2017-06-02: 500 mg via INTRAVENOUS
  Filled 2017-06-02: qty 16

## 2017-06-02 MED ORDER — PALONOSETRON HCL INJECTION 0.25 MG/5ML
0.2500 mg | Freq: Once | INTRAVENOUS | Status: AC
  Administered 2017-06-02: 0.25 mg via INTRAVENOUS
  Filled 2017-06-02: qty 5

## 2017-06-02 MED ORDER — DEXTROSE 5 % IV SOLN: Freq: Once | INTRAVENOUS | Status: DC

## 2017-06-02 MED ORDER — DEXAMETHASONE SODIUM PHOSPHATE 10 MG/ML IJ SOLN
10.0000 mg | Freq: Once | INTRAMUSCULAR | Status: AC
  Administered 2017-06-02: 10 mg via INTRAVENOUS
  Filled 2017-06-02: qty 1

## 2017-06-02 MED ORDER — DIPHENHYDRAMINE HCL 50 MG/ML IJ SOLN
50.0000 mg | Freq: Once | INTRAMUSCULAR | Status: AC
  Administered 2017-06-02: 50 mg via INTRAVENOUS
  Filled 2017-06-02: qty 1

## 2017-06-02 MED ORDER — SODIUM CHLORIDE 0.9 % IV SOLN
1800.0000 mg/m2 | INTRAVENOUS | Status: DC
  Administered 2017-06-02: 3850 mg via INTRAVENOUS
  Filled 2017-06-02: qty 77

## 2017-06-02 MED ORDER — LEUCOVORIN CALCIUM INJECTION 350 MG
650.0000 mg | Freq: Once | INTRAMUSCULAR | Status: AC
Start: 2017-06-02 — End: 2017-06-02
  Administered 2017-06-02: 650 mg via INTRAVENOUS
  Filled 2017-06-02: qty 32.5

## 2017-06-02 MED ORDER — FLUOROURACIL CHEMO INJECTION 2.5 GM/50ML
300.0000 mg/m2 | Freq: Once | INTRAVENOUS | Status: AC
  Administered 2017-06-02: 650 mg via INTRAVENOUS
  Filled 2017-06-02: qty 13

## 2017-06-02 NOTE — Progress Notes (Signed)
Labs drawn and sent to lab. Ok to treat today per MD.  Continuous 5FU pump connected today per protocol.  Treatment given per orders. Patient tolerated it well without problems. Vitals stable and discharged home from clinic ambulatory. Follow up as scheduled.

## 2017-06-02 NOTE — Progress Notes (Signed)
Information on Xeloda given.

## 2017-06-02 NOTE — Patient Instructions (Signed)
[facility] Discharge Instructions for Patients Receiving Chemotherapy   Beginning January 23rd 2017 lab work for the Cancer Center will be done in the  Main lab at [facility] on 1st floor. If you have a lab appointment with the Cancer Center please come in thru the  Main Entrance and check in at the main information desk   Today you received the following chemotherapy agents   To help prevent nausea and vomiting after your treatment, we encourage you to take your nausea medication     If you develop nausea and vomiting, or diarrhea that is not controlled by your medication, call the clinic.  The clinic phone number is [phone]. Office hours are Monday-Friday 8:30am-5:00pm.  BELOW ARE SYMPTOMS THAT SHOULD BE REPORTED IMMEDIATELY:  *FEVER GREATER THAN 101.0 F  *CHILLS WITH OR WITHOUT FEVER  NAUSEA AND VOMITING THAT IS NOT CONTROLLED WITH YOUR NAUSEA MEDICATION  *UNUSUAL SHORTNESS OF BREATH  *UNUSUAL BRUISING OR BLEEDING  TENDERNESS IN MOUTH AND THROAT WITH OR WITHOUT PRESENCE OF ULCERS  *URINARY PROBLEMS  *BOWEL PROBLEMS  UNUSUAL RASH Items with * indicate a potential emergency and should be followed up as soon as possible. If you have an emergency after office hours please contact your primary care physician or go to the nearest emergency department.  Please call the clinic during office hours if you have any questions or concerns.   You may also contact the Patient Navigator at [phone] should you have any questions or need assistance in obtaining follow up care.      Resources For Cancer Patients and their Caregivers ? American Cancer Society: Can assist with transportation, wigs, general needs, runs Look Good Feel Better.        1-888-227-6333 ? Cancer Care: Provides financial assistance, online support groups, medication/co-pay assistance.  1-800-813-HOPE (4673) ? [facility] Assists [place] cancer  patients and their families through emotional , educational and financial support.  336-427-4357 ? [place] DSS Where to apply for food stamps, Medicaid and utility assistance. 336-342-1394 ? [employer]: Transportation to medical appointments. 336-347-2287 ? Social Security Administration: May apply for disability if have a Stage IV cancer. 336-342-7796 1-800-772-1213 ? [place] [facility], Disability and Transit Services: Assists with nutrition, care and transit needs. 336-349-2343         

## 2017-06-04 ENCOUNTER — Inpatient Hospital Stay (HOSPITAL_COMMUNITY): Payer: Medicare Other

## 2017-06-04 ENCOUNTER — Encounter (HOSPITAL_COMMUNITY): Payer: Self-pay

## 2017-06-04 ENCOUNTER — Other Ambulatory Visit: Payer: Self-pay

## 2017-06-04 VITALS — BP 141/74 | HR 74 | Temp 98.2°F | Resp 20

## 2017-06-04 DIAGNOSIS — C20 Malignant neoplasm of rectum: Secondary | ICD-10-CM | POA: Diagnosis not present

## 2017-06-04 DIAGNOSIS — C7889 Secondary malignant neoplasm of other digestive organs: Secondary | ICD-10-CM | POA: Diagnosis not present

## 2017-06-04 DIAGNOSIS — Z5112 Encounter for antineoplastic immunotherapy: Secondary | ICD-10-CM | POA: Diagnosis not present

## 2017-06-04 DIAGNOSIS — C7800 Secondary malignant neoplasm of unspecified lung: Secondary | ICD-10-CM | POA: Diagnosis not present

## 2017-06-04 MED ORDER — SODIUM CHLORIDE 0.9% FLUSH
10.0000 mL | INTRAVENOUS | Status: DC | PRN
  Administered 2017-06-04: 10 mL
  Filled 2017-06-04: qty 10

## 2017-06-04 MED ORDER — HEPARIN SOD (PORK) LOCK FLUSH 100 UNIT/ML IV SOLN
500.0000 [IU] | Freq: Once | INTRAVENOUS | Status: AC | PRN
  Administered 2017-06-04: 500 [IU]

## 2017-06-04 NOTE — Progress Notes (Signed)
Charles [name] presents to have home infusion pump d/c'd and for port-a-cath deaccess with flush.  Proper placement of portacath confirmed by CXR.  Portacath located right chest wall accessed with  H 20 needle.  Good blood return present. Portacath flushed with NS and 500U/65ml Heparin, and needle removed intact.  Procedure tolerated well and without incident.  Discharged ambulatory.

## 2017-06-16 ENCOUNTER — Inpatient Hospital Stay (HOSPITAL_COMMUNITY): Payer: Medicare Other | Attending: Internal Medicine

## 2017-06-16 ENCOUNTER — Inpatient Hospital Stay (HOSPITAL_COMMUNITY): Payer: Medicare Other

## 2017-06-16 ENCOUNTER — Encounter (HOSPITAL_COMMUNITY): Payer: Self-pay

## 2017-06-16 ENCOUNTER — Other Ambulatory Visit: Payer: Self-pay

## 2017-06-16 VITALS — BP 145/79 | HR 70 | Temp 98.0°F | Resp 18 | Wt 212.0 lb

## 2017-06-16 DIAGNOSIS — C7801 Secondary malignant neoplasm of right lung: Secondary | ICD-10-CM | POA: Insufficient documentation

## 2017-06-16 DIAGNOSIS — Z7984 Long term (current) use of oral hypoglycemic drugs: Secondary | ICD-10-CM | POA: Diagnosis not present

## 2017-06-16 DIAGNOSIS — K449 Diaphragmatic hernia without obstruction or gangrene: Secondary | ICD-10-CM | POA: Insufficient documentation

## 2017-06-16 DIAGNOSIS — Z923 Personal history of irradiation: Secondary | ICD-10-CM | POA: Diagnosis not present

## 2017-06-16 DIAGNOSIS — Z79899 Other long term (current) drug therapy: Secondary | ICD-10-CM | POA: Insufficient documentation

## 2017-06-16 DIAGNOSIS — C20 Malignant neoplasm of rectum: Secondary | ICD-10-CM | POA: Insufficient documentation

## 2017-06-16 DIAGNOSIS — K2970 Gastritis, unspecified, without bleeding: Secondary | ICD-10-CM | POA: Diagnosis not present

## 2017-06-16 DIAGNOSIS — I10 Essential (primary) hypertension: Secondary | ICD-10-CM | POA: Insufficient documentation

## 2017-06-16 DIAGNOSIS — F329 Major depressive disorder, single episode, unspecified: Secondary | ICD-10-CM | POA: Insufficient documentation

## 2017-06-16 DIAGNOSIS — K381 Appendicular concretions: Secondary | ICD-10-CM | POA: Insufficient documentation

## 2017-06-16 DIAGNOSIS — I700 Atherosclerosis of aorta: Secondary | ICD-10-CM | POA: Insufficient documentation

## 2017-06-16 DIAGNOSIS — K219 Gastro-esophageal reflux disease without esophagitis: Secondary | ICD-10-CM | POA: Insufficient documentation

## 2017-06-16 DIAGNOSIS — E1151 Type 2 diabetes mellitus with diabetic peripheral angiopathy without gangrene: Secondary | ICD-10-CM | POA: Insufficient documentation

## 2017-06-16 DIAGNOSIS — C7889 Secondary malignant neoplasm of other digestive organs: Secondary | ICD-10-CM | POA: Diagnosis not present

## 2017-06-16 DIAGNOSIS — Z87442 Personal history of urinary calculi: Secondary | ICD-10-CM | POA: Insufficient documentation

## 2017-06-16 DIAGNOSIS — Z5111 Encounter for antineoplastic chemotherapy: Secondary | ICD-10-CM | POA: Diagnosis not present

## 2017-06-16 DIAGNOSIS — Z86711 Personal history of pulmonary embolism: Secondary | ICD-10-CM | POA: Diagnosis not present

## 2017-06-16 DIAGNOSIS — Z7901 Long term (current) use of anticoagulants: Secondary | ICD-10-CM | POA: Insufficient documentation

## 2017-06-16 LAB — CBC WITH DIFFERENTIAL/PLATELET
Basophils Absolute: 0 10*3/uL (ref 0.0–0.1)
Basophils Relative: 0 %
Eosinophils Absolute: 0.2 10*3/uL (ref 0.0–0.7)
Eosinophils Relative: 3 %
HCT: 41.3 % (ref 39.0–52.0)
Hemoglobin: 13.3 g/dL (ref 13.0–17.0)
Lymphocytes Relative: 26 %
Lymphs Abs: 1.6 10*3/uL (ref 0.7–4.0)
MCH: 30 pg (ref 26.0–34.0)
MCHC: 32.2 g/dL (ref 30.0–36.0)
MCV: 93 fL (ref 78.0–100.0)
Monocytes Absolute: 0.5 10*3/uL (ref 0.1–1.0)
Monocytes Relative: 8 %
Neutro Abs: 4 10*3/uL (ref 1.7–7.7)
Neutrophils Relative %: 63 %
Platelets: 161 10*3/uL (ref 150–400)
RBC: 4.44 MIL/uL (ref 4.22–5.81)
RDW: 15.1 % (ref 11.5–15.5)
WBC: 6.3 10*3/uL (ref 4.0–10.5)

## 2017-06-16 LAB — URINALYSIS, DIPSTICK ONLY
Bilirubin Urine: NEGATIVE
Glucose, UA: 150 mg/dL — AB
Hgb urine dipstick: NEGATIVE
Ketones, ur: NEGATIVE mg/dL
Leukocytes, UA: NEGATIVE
Nitrite: NEGATIVE
Protein, ur: NEGATIVE mg/dL
Specific Gravity, Urine: 1.026 (ref 1.005–1.030)
pH: 5 (ref 5.0–8.0)

## 2017-06-16 LAB — COMPREHENSIVE METABOLIC PANEL
ALT: 31 U/L (ref 17–63)
AST: 28 U/L (ref 15–41)
Albumin: 3.5 g/dL (ref 3.5–5.0)
Alkaline Phosphatase: 80 U/L (ref 38–126)
Anion gap: 9 (ref 5–15)
BUN: 12 mg/dL (ref 6–20)
CO2: 24 mmol/L (ref 22–32)
Calcium: 8.8 mg/dL — ABNORMAL LOW (ref 8.9–10.3)
Chloride: 107 mmol/L (ref 101–111)
Creatinine, Ser: 0.79 mg/dL (ref 0.61–1.24)
GFR calc Af Amer: >60 mL/min (ref >60)
GFR calc non Af Amer: >60 mL/min (ref >60)
Glucose, Bld: 264 mg/dL — ABNORMAL HIGH (ref 65–99)
Potassium: 4.2 mmol/L (ref 3.5–5.1)
Sodium: 140 mmol/L (ref 135–145)
Total Bilirubin: 1 mg/dL (ref 0.3–1.2)
Total Protein: 6.1 g/dL — ABNORMAL LOW (ref 6.5–8.1)

## 2017-06-16 LAB — LACTATE DEHYDROGENASE: LDH: 162 U/L (ref 98–192)

## 2017-06-16 MED ORDER — FLUOROURACIL CHEMO INJECTION 2.5 GM/50ML
300.0000 mg/m2 | Freq: Once | INTRAVENOUS | Status: AC
  Administered 2017-06-16: 650 mg via INTRAVENOUS
  Filled 2017-06-16: qty 13

## 2017-06-16 MED ORDER — DEXAMETHASONE SODIUM PHOSPHATE 10 MG/ML IJ SOLN
10.0000 mg | Freq: Once | INTRAMUSCULAR | Status: AC
  Administered 2017-06-16: 10 mg via INTRAVENOUS
  Filled 2017-06-16: qty 1

## 2017-06-16 MED ORDER — SODIUM CHLORIDE 0.9 % IV SOLN
1800.0000 mg/m2 | INTRAVENOUS | Status: DC
  Administered 2017-06-16: 3850 mg via INTRAVENOUS
  Filled 2017-06-16: qty 77

## 2017-06-16 MED ORDER — LEUCOVORIN CALCIUM INJECTION 350 MG
650.0000 mg | Freq: Once | INTRAVENOUS | Status: AC
  Administered 2017-06-16: 650 mg via INTRAVENOUS
  Filled 2017-06-16: qty 32.5

## 2017-06-16 MED ORDER — SODIUM CHLORIDE 0.9 % IV SOLN
500.0000 mg | Freq: Once | INTRAVENOUS | Status: AC
  Administered 2017-06-16: 500 mg via INTRAVENOUS
  Filled 2017-06-16: qty 4

## 2017-06-16 MED ORDER — PALONOSETRON HCL INJECTION 0.25 MG/5ML
0.2500 mg | Freq: Once | INTRAVENOUS | Status: AC
  Administered 2017-06-16: 0.25 mg via INTRAVENOUS
  Filled 2017-06-16: qty 5

## 2017-06-16 MED ORDER — DIPHENHYDRAMINE HCL 50 MG/ML IJ SOLN
50.0000 mg | Freq: Once | INTRAMUSCULAR | Status: AC
  Administered 2017-06-16: 50 mg via INTRAVENOUS
  Filled 2017-06-16: qty 1

## 2017-06-16 MED ORDER — SODIUM CHLORIDE 0.9 % IV SOLN
INTRAVENOUS | Status: DC
  Administered 2017-06-16: 10:00:00 via INTRAVENOUS

## 2017-06-16 NOTE — Progress Notes (Signed)
Tolerated infusions w/o adverse reaction. Alert, in no distress.  VSS.  Discharged ambulatory in c/o spouse.

## 2017-06-18 ENCOUNTER — Inpatient Hospital Stay (HOSPITAL_COMMUNITY): Payer: Medicare Other

## 2017-06-18 ENCOUNTER — Encounter (HOSPITAL_COMMUNITY): Payer: Self-pay

## 2017-06-18 ENCOUNTER — Other Ambulatory Visit: Payer: Self-pay

## 2017-06-18 VITALS — BP 142/80 | HR 80 | Temp 98.6°F | Resp 18

## 2017-06-18 DIAGNOSIS — C20 Malignant neoplasm of rectum: Secondary | ICD-10-CM | POA: Diagnosis not present

## 2017-06-18 DIAGNOSIS — C7889 Secondary malignant neoplasm of other digestive organs: Secondary | ICD-10-CM | POA: Diagnosis not present

## 2017-06-18 DIAGNOSIS — Z923 Personal history of irradiation: Secondary | ICD-10-CM | POA: Diagnosis not present

## 2017-06-18 DIAGNOSIS — Z5111 Encounter for antineoplastic chemotherapy: Secondary | ICD-10-CM | POA: Diagnosis not present

## 2017-06-18 DIAGNOSIS — I10 Essential (primary) hypertension: Secondary | ICD-10-CM | POA: Diagnosis not present

## 2017-06-18 DIAGNOSIS — C7801 Secondary malignant neoplasm of right lung: Secondary | ICD-10-CM | POA: Diagnosis not present

## 2017-06-18 MED ORDER — SODIUM CHLORIDE 0.9% FLUSH
10.0000 mL | INTRAVENOUS | Status: DC | PRN
  Administered 2017-06-18: 10 mL
  Filled 2017-06-18: qty 10

## 2017-06-18 MED ORDER — HEPARIN SOD (PORK) LOCK FLUSH 100 UNIT/ML IV SOLN
500.0000 [IU] | Freq: Once | INTRAVENOUS | Status: AC | PRN
  Administered 2017-06-18: 500 [IU]

## 2017-06-18 NOTE — Progress Notes (Signed)
Charles [name] presents to have home infusion pump d/c'd and for port-a-cath deaccess with flush.  Proper placement of portacath confirmed by CXR.  Portacath located right chest wall accessed with  H 20 needle.  Good blood return present. Portacath flushed with NS and 500U/50ml Heparin, and needle removed intact.  Procedure tolerated well and without incident.  Discharged ambulatory in c/o spouse.

## 2017-06-30 ENCOUNTER — Encounter (HOSPITAL_COMMUNITY): Payer: Self-pay

## 2017-06-30 ENCOUNTER — Inpatient Hospital Stay (HOSPITAL_BASED_OUTPATIENT_CLINIC_OR_DEPARTMENT_OTHER): Payer: Medicare Other | Admitting: Internal Medicine

## 2017-06-30 ENCOUNTER — Inpatient Hospital Stay (HOSPITAL_COMMUNITY): Payer: Medicare Other

## 2017-06-30 ENCOUNTER — Other Ambulatory Visit: Payer: Self-pay

## 2017-06-30 VITALS — BP 153/77 | HR 86 | Temp 97.7°F | Resp 20 | Wt 213.6 lb

## 2017-06-30 DIAGNOSIS — C7889 Secondary malignant neoplasm of other digestive organs: Secondary | ICD-10-CM | POA: Diagnosis not present

## 2017-06-30 DIAGNOSIS — Z5111 Encounter for antineoplastic chemotherapy: Secondary | ICD-10-CM | POA: Diagnosis not present

## 2017-06-30 DIAGNOSIS — Z79899 Other long term (current) drug therapy: Secondary | ICD-10-CM | POA: Diagnosis not present

## 2017-06-30 DIAGNOSIS — I10 Essential (primary) hypertension: Secondary | ICD-10-CM | POA: Diagnosis not present

## 2017-06-30 DIAGNOSIS — Z923 Personal history of irradiation: Secondary | ICD-10-CM

## 2017-06-30 DIAGNOSIS — C7801 Secondary malignant neoplasm of right lung: Secondary | ICD-10-CM | POA: Diagnosis not present

## 2017-06-30 DIAGNOSIS — C20 Malignant neoplasm of rectum: Secondary | ICD-10-CM

## 2017-06-30 LAB — COMPREHENSIVE METABOLIC PANEL
ALT: 23 U/L (ref 17–63)
AST: 22 U/L (ref 15–41)
Albumin: 3.4 g/dL — ABNORMAL LOW (ref 3.5–5.0)
Alkaline Phosphatase: 74 U/L (ref 38–126)
Anion gap: 8 (ref 5–15)
BUN: 10 mg/dL (ref 6–20)
CO2: 23 mmol/L (ref 22–32)
Calcium: 8.6 mg/dL — ABNORMAL LOW (ref 8.9–10.3)
Chloride: 107 mmol/L (ref 101–111)
Creatinine, Ser: 0.78 mg/dL (ref 0.61–1.24)
GFR calc Af Amer: >60 mL/min (ref >60)
GFR calc non Af Amer: >60 mL/min (ref >60)
Glucose, Bld: 233 mg/dL — ABNORMAL HIGH (ref 65–99)
Potassium: 4.2 mmol/L (ref 3.5–5.1)
Sodium: 138 mmol/L (ref 135–145)
Total Bilirubin: 1.3 mg/dL — ABNORMAL HIGH (ref 0.3–1.2)
Total Protein: 6 g/dL — ABNORMAL LOW (ref 6.5–8.1)

## 2017-06-30 LAB — URINALYSIS, DIPSTICK ONLY
Bilirubin Urine: NEGATIVE
Glucose, UA: NEGATIVE mg/dL
Hgb urine dipstick: NEGATIVE
Ketones, ur: NEGATIVE mg/dL
Leukocytes, UA: NEGATIVE
Nitrite: NEGATIVE
Protein, ur: NEGATIVE mg/dL
Specific Gravity, Urine: 1.025 (ref 1.005–1.030)
pH: 5 (ref 5.0–8.0)

## 2017-06-30 LAB — CBC WITH DIFFERENTIAL/PLATELET
Basophils Absolute: 0 10*3/uL (ref 0.0–0.1)
Basophils Relative: 0 %
Eosinophils Absolute: 0.1 10*3/uL (ref 0.0–0.7)
Eosinophils Relative: 3 %
HCT: 40.3 % (ref 39.0–52.0)
Hemoglobin: 12.8 g/dL — ABNORMAL LOW (ref 13.0–17.0)
Lymphocytes Relative: 32 %
Lymphs Abs: 1.5 10*3/uL (ref 0.7–4.0)
MCH: 29.7 pg (ref 26.0–34.0)
MCHC: 31.8 g/dL (ref 30.0–36.0)
MCV: 93.5 fL (ref 78.0–100.0)
Monocytes Absolute: 0.5 10*3/uL (ref 0.1–1.0)
Monocytes Relative: 10 %
Neutro Abs: 2.6 10*3/uL (ref 1.7–7.7)
Neutrophils Relative %: 55 %
Platelets: 166 10*3/uL (ref 150–400)
RBC: 4.31 MIL/uL (ref 4.22–5.81)
RDW: 15.4 % (ref 11.5–15.5)
WBC: 4.8 10*3/uL (ref 4.0–10.5)

## 2017-06-30 LAB — LACTATE DEHYDROGENASE: LDH: 182 U/L (ref 98–192)

## 2017-06-30 MED ORDER — FLUOROURACIL CHEMO INJECTION 2.5 GM/50ML
300.0000 mg/m2 | Freq: Once | INTRAVENOUS | Status: AC
  Administered 2017-06-30: 650 mg via INTRAVENOUS
  Filled 2017-06-30: qty 13

## 2017-06-30 MED ORDER — SODIUM CHLORIDE 0.9 % IV SOLN
500.0000 mg | Freq: Once | INTRAVENOUS | Status: AC
  Administered 2017-06-30: 500 mg via INTRAVENOUS
  Filled 2017-06-30: qty 4

## 2017-06-30 MED ORDER — PALONOSETRON HCL INJECTION 0.25 MG/5ML
INTRAVENOUS | Status: AC
  Filled 2017-06-30: qty 5

## 2017-06-30 MED ORDER — DIPHENHYDRAMINE HCL 50 MG/ML IJ SOLN
50.0000 mg | Freq: Once | INTRAMUSCULAR | Status: AC
Start: 2017-06-30 — End: 2017-06-30
  Administered 2017-06-30: 50 mg via INTRAVENOUS

## 2017-06-30 MED ORDER — DEXAMETHASONE SODIUM PHOSPHATE 10 MG/ML IJ SOLN
10.0000 mg | Freq: Once | INTRAMUSCULAR | Status: AC
  Administered 2017-06-30: 10 mg via INTRAVENOUS

## 2017-06-30 MED ORDER — DEXTROSE 5 % IV SOLN
650.0000 mg | Freq: Once | INTRAVENOUS | Status: AC
  Administered 2017-06-30: 650 mg via INTRAVENOUS
  Filled 2017-06-30: qty 32.5

## 2017-06-30 MED ORDER — PALONOSETRON HCL INJECTION 0.25 MG/5ML
0.2500 mg | Freq: Once | INTRAVENOUS | Status: AC
  Administered 2017-06-30: 0.25 mg via INTRAVENOUS

## 2017-06-30 MED ORDER — HEPARIN SOD (PORK) LOCK FLUSH 100 UNIT/ML IV SOLN: 500.0000 [IU] | Freq: Once | INTRAVENOUS | Status: DC | PRN

## 2017-06-30 MED ORDER — DIPHENHYDRAMINE HCL 50 MG/ML IJ SOLN
INTRAMUSCULAR | Status: AC
  Filled 2017-06-30: qty 1

## 2017-06-30 MED ORDER — SODIUM CHLORIDE 0.9 % IV SOLN
1800.0000 mg/m2 | INTRAVENOUS | Status: DC
  Administered 2017-06-30: 3850 mg via INTRAVENOUS
  Filled 2017-06-30: qty 77

## 2017-06-30 MED ORDER — SODIUM CHLORIDE 0.9 % IV SOLN
INTRAVENOUS | Status: DC
  Administered 2017-06-30: 10:00:00 via INTRAVENOUS

## 2017-06-30 MED ORDER — DEXAMETHASONE SODIUM PHOSPHATE 10 MG/ML IJ SOLN
INTRAMUSCULAR | Status: AC
  Filled 2017-06-30: qty 1

## 2017-06-30 NOTE — Patient Instructions (Signed)
[place] at [facility] Discharge Instructions  RECOMMENDATIONS MADE BY THE CONSULTANT AND ANY TEST RESULTS WILL BE SENT TO YOUR REFERRING PHYSICIAN.  You were seen today by Dr. Zoila Shutter Your lab work is looking good Try the Miralax to help with your bowel movements You will have treatment again in 2 weeks We will have you follow up in 4 weeks with the MD/NP  We talked with you about starting the Xeloda (chemotherapy pill) and then moving the Avastin  Out to every 3 weeks so that you don't have to come so often.    Thank you for choosing [place] at [facility] to provide your oncology and hematology care.  To afford each patient quality time with our provider, please arrive at least 15 minutes before your scheduled appointment time.    If you have a lab appointment with the [facility] please come in thru the  Main Entrance and check in at the main information desk  You need to re-schedule your appointment should you arrive 10 or more minutes late.  We strive to give you quality time with our providers, and arriving late affects you and other patients whose appointments are after yours.  Also, if you no show three or more times for appointments you may be dismissed from the clinic at the providers discretion.     Again, thank you for choosing [facility].  Our hope is that these requests will decrease the amount of time that you wait before being seen by our physicians.       _____________________________________________________________  Should you have questions after your visit to [facility], please contact our office at [phone] between the hours of 8:30 a.m. and 4:30 p.m.  Voicemails left after 4:30 p.m. will not be returned until the following business day.  For prescription refill requests, have your pharmacy contact our office.       Resources For Cancer Patients and their  Caregivers ? American Cancer Society: Can assist with transportation, wigs, general needs, runs Look Good Feel Better.        585-459-4056 ? Cancer Care: Provides financial assistance, online support groups, medication/co-pay assistance.  1-800-813-HOPE 703-196-6638) ? [facility] Assists [place] cancer patients and their families through emotional , educational and financial support.  8066355890 ? [place] DSS Where to apply for food stamps, Medicaid and utility assistance. 862 152 3556 ? [employer]: Transportation to medical appointments. 340 873 1486 ? Social Security Administration: May apply for disability if have a Stage IV cancer. 865-138-1076 478-003-9213 ? [employer], Disability and Transit Services: Assists with nutrition, care and transit needs. [place] Support Programs: @10RELATIVEDAYS @ > Cancer Support Group  2nd Tuesday of the month 1pm-2pm, Journey Room  > Creative Journey  3rd Tuesday of the month 1130am-1pm, Journey Room  > Look Good Feel Better  1st Wednesday of the month 10am-12 noon, Journey Room (Call [place] to register [phone])

## 2017-06-30 NOTE — Progress Notes (Signed)
Tolerated infusion w/o adverse reaction.  Alert, in no distress.  VSS.  Discharged ambulatory in c/o spouse with ambulatory pump infusing as ordered.

## 2017-07-02 ENCOUNTER — Inpatient Hospital Stay (HOSPITAL_COMMUNITY): Payer: Medicare Other

## 2017-07-02 ENCOUNTER — Encounter (HOSPITAL_COMMUNITY): Payer: Self-pay

## 2017-07-02 VITALS — BP 146/78 | HR 80 | Temp 98.4°F | Resp 18

## 2017-07-02 DIAGNOSIS — C20 Malignant neoplasm of rectum: Secondary | ICD-10-CM | POA: Diagnosis not present

## 2017-07-02 DIAGNOSIS — Z923 Personal history of irradiation: Secondary | ICD-10-CM | POA: Diagnosis not present

## 2017-07-02 DIAGNOSIS — Z5111 Encounter for antineoplastic chemotherapy: Secondary | ICD-10-CM | POA: Diagnosis not present

## 2017-07-02 DIAGNOSIS — I10 Essential (primary) hypertension: Secondary | ICD-10-CM | POA: Diagnosis not present

## 2017-07-02 DIAGNOSIS — C7889 Secondary malignant neoplasm of other digestive organs: Secondary | ICD-10-CM | POA: Diagnosis not present

## 2017-07-02 DIAGNOSIS — C7801 Secondary malignant neoplasm of right lung: Secondary | ICD-10-CM | POA: Diagnosis not present

## 2017-07-02 MED ORDER — SODIUM CHLORIDE 0.9% FLUSH
10.0000 mL | INTRAVENOUS | Status: DC | PRN
  Administered 2017-07-02: 10 mL
  Filled 2017-07-02: qty 10

## 2017-07-02 MED ORDER — HEPARIN SOD (PORK) LOCK FLUSH 100 UNIT/ML IV SOLN
500.0000 [IU] | Freq: Once | INTRAVENOUS | Status: AC | PRN
  Administered 2017-07-02: 500 [IU]

## 2017-07-02 NOTE — Patient Instructions (Signed)
[place] at [facility]  Discharge Instructions:  Your pump was removed today.  _______________________________________________________________  Thank you for choosing [place] at [facility] to provide your oncology and hematology care.  To afford each patient quality time with our providers, please arrive at least 15 minutes before your scheduled appointment.  You need to re-schedule your appointment if you arrive 10 or more minutes late.  We strive to give you quality time with our providers, and arriving late affects you and other patients whose appointments are after yours.  Also, if you no show three or more times for appointments you may be dismissed from the clinic.  Again, thank you for choosing [place] at [place] hope is that these requests will allow you access to exceptional care and in a timely manner. _______________________________________________________________  If you have questions after your visit, please contact our office at [phone] between the hours of 8:30 a.m. and 5:00 p.m. Voicemails left after 4:30 p.m. will not be returned until the following business day. _______________________________________________________________  For prescription refill requests, have your pharmacy contact our office. _______________________________________________________________  Recommendations made by the consultant and any test results will be sent to your referring physician. _______________________________________________________________

## 2017-07-02 NOTE — Progress Notes (Signed)
Patient in for removal of 5FU pump.  Port site clean and dry with no bruising or swelling noted at site.  No complaints of pain with flush.  Band aid applied.  VSS with discharge.  No s/s of distress noted with discharge.    Patient stated he has dry nasal passages and prn noted blood that is bright red but stops quickly.  He does "pick" at his nose at times per wife.  Patient will try a humidifier and saline nasal spray for relief.  Instructed the patient and wife to call if the prn blood from nose continues for further evaluation.  Patient also stated he is noticing more frequent indigestion on pump removal day.  He currently takes one Prilosic 20 mg daily with relief.  After review of medications the directions for the Prilosec states Prilosec 20 mg twice a day before a meal.  Patient will try this the week of chemotherapy to see if this relieves his indigestion.

## 2017-07-13 NOTE — Progress Notes (Signed)
Diagnosis Rectal cancer ([place]) - Plan: CT Chest W Contrast, CT Abdomen Pelvis W Wo Contrast, CBC with Differential/Platelet, Comprehensive metabolic panel, Lactate dehydrogenase, DISCONTINUED: heparin lock flush 100 unit/mL, DISCONTINUED: dexamethasone (DECADRON) injection 10 mg, DISCONTINUED: diphenhydrAMINE (BENADRYL) injection 50 mg, DISCONTINUED: palonosetron (ALOXI) injection 0.25 mg, DISCONTINUED: bevacizumab (AVASTIN) 500 mg in sodium chloride 0.9 % 100 mL chemo infusion, DISCONTINUED: leucovorin 650 mg in dextrose 5 % 250 mL infusion, DISCONTINUED: fluorouracil (ADRUCIL) chemo injection 650 mg, DISCONTINUED: fluorouracil (ADRUCIL) 3,850 mg in sodium chloride 0.9 % 73 mL chemo infusion, DISCONTINUED: sodium chloride flush (NS) 0.9 % injection 10 mL, DISCONTINUED: heparin lock flush 100 unit/mL  Staging Cancer Staging Rectal cancer (HCC) Staging form: Colon and Rectum, AJCC 7th Edition - Clinical: Stage IIIB (T3, N1, M0) - Signed by [name], MD on 02/04/2011 Prognostic indicators: EGFR status currently unknown    - Pathologic stage from 11/17/2014: Stage IVA (M1a) - Signed by Baird Cancer, PA-C on 09/14/2015 Prognostic indicators: EGFR status currently unknown      Assessment and Plan:  1.  67 y.o. 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  Currently undergoing systemic palliative therapy with 5-FU and bevacizumab with oxaliplatin previously stopped due to progressive neuropathy.  He is here for evaluation prior to C26 of therapy.  Labs adequate for chemotherapy.  Recent CT of the chest/abdomen/pelvis done on 05/01/17 demonstrates stable disease.  He will be set up for repeat imaging in Late March or early April 2019.  He will continue therapy as directed.  Will check with case manager if he will be able to switch to Xeloda.    2.  Hypertension.  BP is 146/78.  Continue to follow-up with PCP.    3.  DVT.  He is currently on Xarelto.     Will monitor for any bleeding episodes.       Rectal cancer ([place])   07/24/2010 Initial Diagnosis    Invasive adenocarcinoma of rectum      09/09/2010 Concurrent Chemotherapy    S/P radiation and concurrent 5-FU continuous infusion from 09/09/10- 10/10/10.      11/14/2010 Surgery    S/P proctectomy with colorectal anastomosis and diverting loop ileostomy on 11/14/10 at [facility] by Dr. Harlon Ditty. Pathology reveals a pT3b N1 with 3/20 lymph nodes.      02/05/2011 - 07/14/2011 Chemotherapy    FOLFOX      08/18/2011 Surgery    Approximate date of surgery- [place] by Dr. Harlon Ditty       Remission         11/24/2013 Survivorship    Colonoscopy- somewhat fibrotic/friable anastomotic mucosal-status post biopsy (narrowing not felt to be clinically significant).  Negative pathology for malignancy      09/12/2014 Imaging    DVT in the left femoral venous system, left common iliac vein, IVC, and within the IVC filter Right lower extremity venography confirms chronic occlusion of the femoral venous system with collateralization. The right iliac venous system is patent and do      09/25/2014 PET scan    The right middle lobe pulmonary nodule is hypermetabolic, favored to represent a primary bronchogenic carcinoma.Equivocal mediastinal nodes, similar to surrounding blood pool. Bilateral adrenal hypermetabolism, felt to be physiologic      10/24/2014 Pathology Results    Lung, needle/core biopsy(ies), RML - ADENOCARCINOMA, SEE COMMENT metastatic adenocarcinoma of a colorectal primary      10/24/2014 Relapse/Recurrence  11/16/2014 Definitive Surgery    Bronchoscopy, right video-assisted thoracoscopy, wedge resection of right middle lobe by Dr. Servando Snare      11/16/2014 Pathology Results    Lung, wedge biopsy/resection, right lingula and small portion of middle lobe - METASTATIC ADENOCARCINOMA, CONSISTENT WITH COLORECTAL PRIMARY, SPANNING 2.0 CM. - THE SURGICAL RESECTION MARGINS ARE  NEGATIVE FOR ADENOCARCINOMA.      11/16/2014 Remission    THE SURGICAL RESECTION MARGINS ARE NEGATIVE FOR ADENOCARCINOMA.      03/07/2015 Imaging    CT CAP- Interval resection of right middle lobe metastasis. No acute process or evidence of metastatic disease in the chest, abdomen or pelvis. Improved right upper lobe reticular nodular opacities are favored to represent resolving infection.      09/14/2015 Imaging    CT CAP- No findings of recurrent malignancy. No recurrence along the wedge resection site of the right middle lobe. Right anterior abdominal wall focal hernia containing a knuckle of small bowel without complicating feature.      06/13/2016 Imaging    CT CAP- 1. New right-sided pleural metastasis/mass. Other smaller right-sided pulmonary nodules which are all pleural-based and most likely represent pleural metastasis. 2. No convincing evidence of abdominopelvic nodal metastasis. 3. Constellation of findings, including pancreatic atrophy, duct dilatation, and pancreatic head soft tissue fullness which are highly suspicious for pancreatic adenocarcinoma. Metastatic disease felt much less likely. Consider endoscopic ultrasound sampling or ERCP. Cannot exclude superimposed acute pancreatitis. 4. New enlargement of the appendiceal tip with subtle surrounding edema. Cannot exclude early or mild appendicitis. 5. Coronary artery atherosclerosis. Aortic atherosclerosis. 6. 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      06/13/2016 Progression    CT scan demonstrates progression of disease      06/19/2016 Procedure    EUS with FNA by Dr. Ardis Hughs      06/20/2016 Pathology Results    FINE NEEDLE ASPIRATION, ENDOSCOPIC, PANCREAS UNCINATE AREA(SPECIMEN 1 OF 1 COLLECTED 06/19/16): MALIGNANT CELLS CONSISTENT WITH METASTATIC ADENOCARCINOMA.      06/25/2016 PET scan    1. Two pleural-based nodules in the right hemithorax are hypermetabolic. Metastatic disease is  a distinct consideration. The scattered pulmonary parenchymal nodule seen on previous diagnostic CT imaging are below the threshold for reliable resolution on PET imaging. 2. Hypermetabolic lesion pancreatic head, consistent with neoplasm. As noted on prior CT, adenocarcinoma is any consideration. No evidence for hypermetabolic abdominal lymphadenopathy. Mottled uptake noted in the liver, but no discrete hepatic metastases are evident on PET imaging. 3. Appendix remains distended up to 0.9-10 mm diameter with a stone towards the tip in subtle periappendiceal edema/inflammation. The appendix is hypermetabolic along its length. Imaging features are relatively stable in the 12 day interval since prior CT scan. While appendicitis is a consideration, the relative stability over 12 days would be unusual for that etiology. Continued close follow-up recommended.      06/26/2016 Pathology Results    Not enough tissue for foundationONE or K-ras testing.      07/07/2016 Procedure    Port placed by Dr. Arnoldo Morale      07/08/2016 -  Chemotherapy    The patient had pegfilgrastim (NEULASTA) injection 6 mg, 6 mg, Subcutaneous,  Once, 0 of 4 cycles  ondansetron (ZOFRAN) IVPB 8 mg, 8 mg, Intravenous,  Once, 0 of 4 cycles  leucovorin 804 mg in dextrose 5 % 250 mL infusion, 400 mg/m2, Intravenous,  Once, 0 of 4 cycles  oxaliplatin (ELOXATIN) 170 mg in dextrose 5 % 500  mL chemo infusion, 85 mg/m2, Intravenous,  Once, 0 of 4 cycles  fluorouracil (ADRUCIL) 4,800 mg in sodium chloride 0.9 % 150 mL chemo infusion, 2,400 mg/m2 = 4,800 mg, Intravenous, 1 Day/Dose, 0 of 4 cycles  fluorouracil (ADRUCIL) chemo injection 800 mg, 400 mg/m2, Intravenous,  Once, 0 of 4 cycles  pegfilgrastim (NEULASTA) injection 6 mg, 6 mg, Subcutaneous,  Once, 2 of 2 cycles  ondansetron (ZOFRAN) IVPB 8 mg, 8 mg, Intravenous,  Once, 12 of 12 cycles  leucovorin 660 mg in dextrose 5 % 250 mL infusion, 660 mg (original dose ),  Intravenous,  Once, 12 of 12 cycles Dose modification: 660 mg (Cycle 1)  oxaliplatin (ELOXATIN) 140 mg in dextrose 5 % 500 mL chemo infusion, 140 mg (original dose ), Intravenous,  Once, 12 of 12 cycles Dose modification: 140 mg (Cycle 1), 119 mg (85 % of original dose 140 mg, Cycle 8, Reason: Provider Judgment)  fluorouracil (ADRUCIL) 3,850 mg in sodium chloride 0.9 % 150 mL chemo infusion, 3,850 mg (original dose ), Intravenous, 1 Day/Dose, 12 of 12 cycles Dose modification: 3,850 mg (Cycle 1)  fluorouracil (ADRUCIL) chemo injection 700 mg, 700 mg (original dose ), Intravenous,  Once, 12 of 12 cycles Dose modification: 700 mg (Cycle 1)  palonosetron (ALOXI) injection 0.25 mg, 0.25 mg, Intravenous,  Once, 7 of 12 cycles Administration: 0.25 mg (09/30/2016)  bevacizumab (AVASTIN) 475 mg in sodium chloride 0.9 % 100 mL chemo infusion, 5 mg/kg = 475 mg, Intravenous,  Once, 6 of 11 cycles Administration: 500 mg (09/30/2016)  leucovorin 880 mg in dextrose 5 % 250 mL infusion, 400 mg/m2 = 880 mg, Intravenous,  Once, 7 of 12 cycles Administration: 880 mg (09/30/2016)  oxaliplatin (ELOXATIN) 185 mg in dextrose 5 % 500 mL chemo infusion, 85 mg/m2 = 185 mg, Intravenous,  Once, 7 of 12 cycles Administration: 185 mg (09/30/2016)  fluorouracil (ADRUCIL) chemo injection 900 mg, 400 mg/m2 = 900 mg, Intravenous,  Once, 7 of 12 cycles Administration: 900 mg (09/30/2016)  fluorouracil (ADRUCIL) 5,300 mg in sodium chloride 0.9 % 144 mL chemo infusion, 2,400 mg/m2 = 5,300 mg, Intravenous, 1 Day/Dose, 7 of 12 cycles Administration: 5,300 mg (09/30/2016)  for chemotherapy treatment.        08/27/2016 Imaging    CT CAP- 1. Pulmonary metastatic disease is stable. 2. Pancreatic head mass, grossly stable, with progressive atrophy of the body and tail of the pancreas. Stable peripancreatic lymph node. 3. Mild circumferential rectal wall thickening, stable. 4. Aortic atherosclerosis (ICD10-170.0). Coronary  artery calcification. 5. Hyperattenuating lesion off the lower pole left kidney, stable, too small to characterize. Continued attention on followup exams is warranted. 6. Persistent wall thickening and mild dilatation of the proximal appendix, of uncertain etiology. Continued attention on followup exams is warranted.      11/06/2016 Imaging    CT C/A/P: IMPRESSION: 1. Stable pulmonary metastatic disease. 2. Similar appearing pancreatic head mass. Progressed atrophy of the pancreatic body and tail. Stable peripancreatic lymph node. 3. Stable hypoattenuating lesion partially exophytic off the inferior pole of the left kidney. 4. Aortic atherosclerosis. 5. Persistent mild wall thickening and mild dilatation of the appendix.       01/30/2017 Imaging    Ct C/A/P: IMPRESSION: 1. Stable pulmonary metastatic disease. 2. Stable to slight increase in size of pancreatic head mass. 3.  Aortic Atherosclerosis (ICD10-I70.0). 4. Similar appearance of mild wall thickening of the appendix which contains an appendicolith.      05/01/2017 Imaging    CT  C/A/P: Stable disease in the lungs, no progression of disease in the abdomen.       Problem List Patient Active Problem List   Diagnosis Date Noted  . Community acquired pneumonia of right lower lobe of lung ([place]) [J18.1] 07/23/2016  . Abnormal pancreas function test [R94.8]   . Rectal pain [K62.89] 05/30/2016  . Hiatal hernia [K44.9]   . Schatzki's ring [K22.2]   . GI bleed [K92.2] 11/23/2014  . Gastritis [K29.70] 11/23/2014  . S/P thoracotomy [Z98.890] 11/23/2014  . S/P partial lobectomy of lung [Z90.2] 11/15/2014  . DVT (deep venous thrombosis) ([place]) [I82.409]   . Lung nodule [R91.1]   . Lung nodule, solitary [R91.1]   . Acute pulmonary embolism ([place]) [I26.99] 09/08/2014  . Lactic acidosis [E87.2] 09/08/2014  . Lower abdominal pain [R10.30] 09/08/2014  . Urinary retention [R33.9] 09/08/2014  . Diarrhea [R19.7] 08/01/2014  .  Peripheral neuropathy due to oxaliplatin-chemotherapy [G62.0, T45.1X5A] 07/12/2014  . Stiffness of joint, not elsewhere classified, ankle and foot [M25.673, M25.676] 01/31/2014  . Weakness of both legs [R29.898] 01/31/2014  . Hx of radiation therapy [Z92.3]   . Rectal cancer ([place]) [C20] 01/15/2011  . Colon cancer ([place]) [C18.9] 07/24/2010  . GERD [K21.9] 07/11/2010    Past Medical History Past Medical History:  Diagnosis Date  . Chronic anticoagulation   . Colon cancer ([place]) 07/24/2010   rectal ca, inv adenocarcinoma  . Depression 04/01/2011  . Diabetes mellitus without complication ([place])   . DVT (deep venous thrombosis) ([place]) 05/09/2011  . GERD (gastroesophageal reflux disease)   . High output ileostomy ([place]) 05/21/2011  . History of kidney stones   . HTN (hypertension)   . Hx of radiation therapy 09/02/10 to 10/14/10   pelvis  . Lung metastasis ([place])   . Neuropathy   . Peripheral vascular disease ([place])    dvt's,pe  . Pneumonia    hx x3  . Pulmonary embolism ([place])   . Rectal cancer ([place]) 01/15/2011   S/P radiation and concurrent 5-FU continuous infusion from 09/09/10- 10/10/10.  S/P proctectomy with colorectal anastomosis and diverting loop ileostomy on 11/14/10 at [facility] by Dr. Harlon Ditty. Pathology reveals a pT3b N1 with 3/20 lymph nodes.      Past Surgical History Past Surgical History:  Procedure Laterality Date  . COLON SURGERY  11/14/2010   proctectomy with colorectal anastomosis and diverting loop ileostomy (temporary planned)  . COLONOSCOPY  07/2010   proximal rectal apple core mass 10-14cm from anal verge (adenocarcinoma), 2-3cm distal rectal carpet polyp s/p piecemeal snare polypectomy (adenoma)  . COLONOSCOPY  04/21/2012   RMR: Friable,fibrotic appearing colorectal anastomosis producing some luminal narrowing-not felt to be critical. path: focal erosion with slight inflammation and hyperemia. SURVEILLANCE DUE DEC 2015  . COLONOSCOPY N/A 11/24/2013   Dr. Rourk:somewhat  fibrotic/friable anastomotic mucosal-status post biopsy (narrowing not felt to be clinically significant) Single colonic diverticulum. benign polypoid rectal mucosa  . colostomy reversal  april 2013  . ESOPHAGOGASTRODUODENOSCOPY  07/2010   RMR: schatki ring s/p dilation, small hh, SB bx benign  . ESOPHAGOGASTRODUODENOSCOPY N/A 09/04/2015   Procedure: ESOPHAGOGASTRODUODENOSCOPY (EGD);  Surgeon: Daneil Dolin, MD;  Location: [facility] ENDO SUITE;  Service: Endoscopy;  Laterality: N/A;  730  . EUS  08/2010   Dr. Owens Loffler. uT3N0 circumferential, nearly obstruction rectosigmoid adenocarcinoma, distal edge 12cm from anal verge  . EUS N/A 06/19/2016   Procedure: UPPER ENDOSCOPIC ULTRASOUND (EUS) RADIAL;  Surgeon: Milus Banister, MD;  Location: [facility] ENDOSCOPY;  Service: Endoscopy;  Laterality: N/A;  . HERNIA REPAIR     abd hernia repair  . IVC filter    . ivc filter    . port a cath placement    . PORT-A-CATH REMOVAL  09/24/2011   Procedure: REMOVAL PORT-A-CATH;  Surgeon: Donato Heinz, MD;  Location: [facility] ORS;  Service: General;  Laterality: N/A;  Minor Room  . PORTACATH PLACEMENT Right 07/07/2016   Procedure: INSERTION PORT-A-CATH;  Surgeon: Aviva Signs, MD;  Location: [facility] ORS;  Service: General;  Laterality: Right;  Marland Kitchen VIDEO ASSISTED THORACOSCOPY (VATS)/WEDGE RESECTION Right 11/15/2014   Procedure: VIDEO ASSISTED THORACOSCOPY (VATS)/LUNG RESECTION WITH RIGHT LINGULECTOMY;  Surgeon: [name], MD;  Location: [facility];  Service: Thoracic;  Laterality: Right;  Marland Kitchen VIDEO BRONCHOSCOPY N/A 11/15/2014   Procedure: VIDEO BRONCHOSCOPY;  Surgeon: [name], MD;  Location: [facility] OR;  Service: Thoracic;  Laterality: N/A;    Family History Family History  Problem Relation Age of Onset  . Cancer Brother        throat  . Cancer Brother        prostate  . Colon cancer Neg Hx   . Liver disease Neg Hx   . Inflammatory bowel disease Neg Hx      Social History  reports that  has never smoked. he has never  used smokeless tobacco. He reports that he does not drink alcohol or use drugs.  Medications  Current Outpatient Medications:  .  acetaminophen (TYLENOL) 500 MG tablet, Take 1,000 mg by mouth every 6 (six) hours as needed., Disp: , Rfl:  .  clonazePAM (KLONOPIN) 1 MG tablet, Take 1 tablet (1 mg total) by mouth 2 (two) times daily as needed for anxiety., Disp: 60 tablet, Rfl: 0 .  dexamethasone (DECADRON) 4 MG tablet, Take 2 tablets (8 mg total) by mouth daily. Start the day after chemotherapy for 2 days. Take with food., Disp: 30 tablet, Rfl: 1 .  Diphenhyd-Hydrocort-Nystatin (FIRST-DUKES MOUTHWASH) SUSP, Use as directed 5 mLs 4 (four) times daily in the mouth or throat., Disp: 360 mL, Rfl: 3 .  diphenoxylate-atropine (LOMOTIL) 2.5-0.025 MG tablet, Take 1 tablet by mouth 4 (four) times daily as needed for diarrhea or loose stools., Disp: 60 tablet, Rfl: 1 .  dronabinol (MARINOL) 2.5 MG capsule, Take 1 capsule (2.5 mg total) by mouth 2 (two) times daily before a meal., Disp: 60 capsule, Rfl: 1 .  fluorouracil CALGB 21308 in sodium chloride 0.9 % 150 mL, Inject into the vein over 96 hr., Disp: , Rfl:  .  gabapentin (NEURONTIN) 300 MG capsule, TAKE 3 CAPSULES BY MOUTH TWICE DAILY, Disp: 540 capsule, Rfl: 1 .  glimepiride (AMARYL) 2 MG tablet, Take 2 mg by mouth daily with breakfast. , Disp: , Rfl:  .  HYDROcodone-acetaminophen (NORCO/VICODIN) 5-325 MG tablet, Take 1-2 tablets by mouth every 6 (six) hours as needed for moderate pain., Disp: 75 tablet, Rfl: 0 .  leucovorin in dextrose 5 % 250 mL, Inject into the vein once., Disp: , Rfl:  .  lisinopril (PRINIVIL,ZESTRIL) 10 MG tablet, , Disp: , Rfl:  .  mirtazapine (REMERON) 7.5 MG tablet, Take 7.5 mg by mouth at bedtime., Disp: , Rfl:  .  omeprazole (PRILOSEC) 20 MG capsule, Take 20 mg by mouth 2 (two) times daily before a meal. , Disp: , Rfl:  .  ondansetron (ZOFRAN) 8 MG tablet, Take 1 tablet (8 mg total) by mouth 2 (two) times daily as needed for  refractory nausea / vomiting. Start on  day 3 after chemotherapy., Disp: 30 tablet, Rfl: 1 .  potassium chloride SA (K-DUR,KLOR-CON) 20 MEQ tablet, Take 1 tablet (20 mEq total) by mouth 2 (two) times daily., Disp: 60 tablet, Rfl: 3 .  prochlorperazine (COMPAZINE) 10 MG tablet, Take 1 tablet (10 mg total) by mouth every 6 (six) hours as needed (Nausea or vomiting)., Disp: 30 tablet, Rfl: 1 .  psyllium (REGULOID) 0.52 g capsule, Take 0.52 g by mouth daily., Disp: , Rfl:  .  rivaroxaban (XARELTO) 20 MG TABS tablet, Take 1 tablet (20 mg total) by mouth daily with supper., Disp: 30 tablet, Rfl: 3 .  tamsulosin (FLOMAX) 0.4 MG CAPS capsule, Take 0.4 mg by mouth at bedtime. , Disp: , Rfl:  .  traZODone (DESYREL) 100 MG tablet, Take 100 mg by mouth at bedtime., Disp: , Rfl:  .  zinc oxide 20 % ointment, Apply 1 application topically as needed for irritation., Disp: 56.7 g, Rfl: 1 No current facility-administered medications for this visit.   Facility-Administered Medications Ordered in Other Visits:  .  sodium chloride 0.9 % injection 10 mL, 10 mL, Intravenous, PRN, Everardo All, MD, 10 mL at 08/22/11 1114 .  sodium chloride flush (NS) 0.9 % injection 10 mL, 10 mL, Intracatheter, PRN, Lebron Conners, Marinell Blight, MD, 10 mL at 05/08/17 1127  Allergies Oxycodone; Tramadol; and Trazodone and nefazodone  Review of Systems Review of Systems - Oncology ROS as per HPI otherwise 12 point ROS is negative.   Physical Exam  Vitals Wt Readings from Last 3 Encounters:  06/30/17 213 lb 9.6 oz (96.9 kg)  06/16/17 212 lb (96.2 kg)  06/02/17 208 lb (94.3 kg)   Temp Readings from Last 3 Encounters:  07/02/17 98.4 F (36.9 C) (Oral)  06/30/17 97.7 F (36.5 C) (Oral)  06/18/17 98.6 F (37 C) (Oral)   BP Readings from Last 3 Encounters:  07/02/17 (!) 146/78  06/30/17 (!) 153/77  06/18/17 (!) 142/80   Pulse Readings from Last 3 Encounters:  07/02/17 80  06/30/17 86  06/18/17 80   Constitutional:  Well-developed, well-nourished, and in no distress.   HENT: Head: Normocephalic and atraumatic.  Mouth/Throat: No oropharyngeal exudate. Mucosa moist. Eyes: Pupils are equal, round, and reactive to light. Conjunctivae are normal. No scleral icterus.  Neck: Normal range of motion. Neck supple. No JVD present.  Cardiovascular: Normal rate, regular rhythm and normal heart sounds.  Exam reveals no gallop and no friction rub.   No murmur heard. Pulmonary/Chest: Effort normal and breath sounds normal. No respiratory distress. No wheezes.No rales.  Abdominal: Soft. Bowel sounds are normal. No distension. There is no tenderness. There is no guarding.  Musculoskeletal: No edema or tenderness.  Lymphadenopathy: No cervical, axillary or supraclavicular adenopathy.  Neurological: Alert and oriented to person, place, and time. No cranial nerve deficit.  Skin: Skin is warm and dry. No rash noted. No erythema. No pallor.  Psychiatric: Affect and judgment normal.   Labs Lab on 06/30/2017  Component Date Value Ref Range Status  . WBC 06/30/2017 4.8  4.0 - 10.5 K/uL Final  . RBC 06/30/2017 4.31  4.22 - 5.81 MIL/uL Final  . Hemoglobin 06/30/2017 12.8* 13.0 - 17.0 g/dL Final  . HCT 06/30/2017 40.3  39.0 - 52.0 % Final  . MCV 06/30/2017 93.5  78.0 - 100.0 fL Final  . MCH 06/30/2017 29.7  26.0 - 34.0 pg Final  . MCHC 06/30/2017 31.8  30.0 - 36.0 g/dL Final  . RDW 06/30/2017 15.4  11.5 - 15.5 %  Final  . Platelets 06/30/2017 166  150 - 400 K/uL Final  . Neutrophils Relative % 06/30/2017 55  % Final  . Neutro Abs 06/30/2017 2.6  1.7 - 7.7 K/uL Final  . Lymphocytes Relative 06/30/2017 32  % Final  . Lymphs Abs 06/30/2017 1.5  0.7 - 4.0 K/uL Final  . Monocytes Relative 06/30/2017 10  % Final  . Monocytes Absolute 06/30/2017 0.5  0.1 - 1.0 K/uL Final  . Eosinophils Relative 06/30/2017 3  % Final  . Eosinophils Absolute 06/30/2017 0.1  0.0 - 0.7 K/uL Final  . Basophils Relative 06/30/2017 0  % Final  .  Basophils Absolute 06/30/2017 0.0  0.0 - 0.1 K/uL Final   Performed at [facility], [address] [place], [place] [address]  . Sodium 06/30/2017 138  135 - 145 mmol/L Final  . Potassium 06/30/2017 4.2  3.5 - 5.1 mmol/L Final  . Chloride 06/30/2017 107  101 - 111 mmol/L Final  . CO2 06/30/2017 23  22 - 32 mmol/L Final  . Glucose, Bld 06/30/2017 233* 65 - 99 mg/dL Final  . BUN 06/30/2017 10  6 - 20 mg/dL Final  . Creatinine, Ser 06/30/2017 0.78  0.61 - 1.24 mg/dL Final  . Calcium 06/30/2017 8.6* 8.9 - 10.3 mg/dL Final  . Total Protein 06/30/2017 6.0* 6.5 - 8.1 g/dL Final  . Albumin 06/30/2017 3.4* 3.5 - 5.0 g/dL Final  . AST 06/30/2017 22  15 - 41 U/L Final  . ALT 06/30/2017 23  17 - 63 U/L Final  . Alkaline Phosphatase 06/30/2017 74  38 - 126 U/L Final  . Total Bilirubin 06/30/2017 1.3* 0.3 - 1.2 mg/dL Final  . GFR calc non Af Amer 06/30/2017 >60  >60 mL/min Final  . GFR calc Af Amer 06/30/2017 >60  >60 mL/min Final   Comment: (NOTE) The eGFR has been calculated using the CKD EPI equation. This calculation has not been validated in all clinical situations. eGFR's persistently <60 mL/min signify possible Chronic Kidney Disease.   Georgiann Hahn gap 06/30/2017 8  5 - 15 Final   Performed at [facility], [address], [place], [place] [address]  . LDH 06/30/2017 182  98 - 192 U/L Final   Performed at [facility], [address] [place], [place] [address]  . Color, Urine 06/30/2017 YELLOW  YELLOW Final  . APPearance 06/30/2017 CLEAR  CLEAR Final  . Specific Gravity, Urine 06/30/2017 1.025  1.005 - 1.030 Final  . pH 06/30/2017 5.0  5.0 - 8.0 Final  . Glucose, UA 06/30/2017 NEGATIVE  NEGATIVE mg/dL Final  . Hgb urine dipstick 06/30/2017 NEGATIVE  NEGATIVE Final  . Bilirubin Urine 06/30/2017 NEGATIVE  NEGATIVE Final  . Ketones, ur 06/30/2017 NEGATIVE  NEGATIVE mg/dL Final  . Protein, ur 06/30/2017 NEGATIVE  NEGATIVE mg/dL Final  . Nitrite 06/30/2017 NEGATIVE  NEGATIVE Final  .  Leukocytes, UA 06/30/2017 NEGATIVE  NEGATIVE Final   Performed at [facility], [address] [place], [place] [address]     Pathology Orders Placed This Encounter  Procedures  . CT Chest W Contrast    Standing Status:   Future    Standing Expiration Date:   06/30/2018    Order Specific Question:   If indicated for the ordered procedure, I authorize the administration of contrast media per Radiology protocol    Answer:   Yes    Order Specific Question:   Preferred imaging location?    Answer:   [facility]  Order Specific Question:   Radiology Contrast Protocol - do NOT remove file path    Answer:   [email or web address]  . CT Abdomen Pelvis W Wo Contrast    Standing Status:   Future    Standing Expiration Date:   06/30/2018    Order Specific Question:   If indicated for the ordered procedure, I authorize the administration of contrast media per Radiology protocol    Answer:   Yes    Order Specific Question:   Preferred imaging location?    Answer:   [facility]    Order Specific Question:   Radiology Contrast Protocol - do NOT remove file path    Answer:   [email or web address]  . CBC with Differential/Platelet    Standing Status:   Future    Standing Expiration Date:   06/30/2018  . Comprehensive metabolic panel    Standing Status:   Future    Standing Expiration Date:   06/30/2018  . Lactate dehydrogenase    Standing Status:   Future    Standing Expiration Date:   06/30/2018       Zoila Shutter MD

## 2017-07-14 ENCOUNTER — Encounter (HOSPITAL_COMMUNITY): Payer: Self-pay

## 2017-07-14 ENCOUNTER — Other Ambulatory Visit: Payer: Self-pay

## 2017-07-14 ENCOUNTER — Inpatient Hospital Stay (HOSPITAL_COMMUNITY): Payer: Medicare Other | Attending: Internal Medicine

## 2017-07-14 ENCOUNTER — Inpatient Hospital Stay (HOSPITAL_COMMUNITY): Payer: Medicare Other

## 2017-07-14 VITALS — BP 127/73 | HR 72 | Temp 97.4°F | Resp 16 | Wt 210.0 lb

## 2017-07-14 DIAGNOSIS — C20 Malignant neoplasm of rectum: Secondary | ICD-10-CM | POA: Diagnosis not present

## 2017-07-14 DIAGNOSIS — K219 Gastro-esophageal reflux disease without esophagitis: Secondary | ICD-10-CM | POA: Diagnosis not present

## 2017-07-14 DIAGNOSIS — K2970 Gastritis, unspecified, without bleeding: Secondary | ICD-10-CM | POA: Insufficient documentation

## 2017-07-14 DIAGNOSIS — I10 Essential (primary) hypertension: Secondary | ICD-10-CM | POA: Insufficient documentation

## 2017-07-14 DIAGNOSIS — E1151 Type 2 diabetes mellitus with diabetic peripheral angiopathy without gangrene: Secondary | ICD-10-CM | POA: Diagnosis not present

## 2017-07-14 DIAGNOSIS — Z7984 Long term (current) use of oral hypoglycemic drugs: Secondary | ICD-10-CM | POA: Insufficient documentation

## 2017-07-14 DIAGNOSIS — F329 Major depressive disorder, single episode, unspecified: Secondary | ICD-10-CM | POA: Insufficient documentation

## 2017-07-14 DIAGNOSIS — C7889 Secondary malignant neoplasm of other digestive organs: Secondary | ICD-10-CM | POA: Insufficient documentation

## 2017-07-14 DIAGNOSIS — K381 Appendicular concretions: Secondary | ICD-10-CM | POA: Insufficient documentation

## 2017-07-14 DIAGNOSIS — Z5111 Encounter for antineoplastic chemotherapy: Secondary | ICD-10-CM | POA: Diagnosis not present

## 2017-07-14 DIAGNOSIS — C7801 Secondary malignant neoplasm of right lung: Secondary | ICD-10-CM | POA: Insufficient documentation

## 2017-07-14 DIAGNOSIS — K449 Diaphragmatic hernia without obstruction or gangrene: Secondary | ICD-10-CM | POA: Diagnosis not present

## 2017-07-14 DIAGNOSIS — Z87442 Personal history of urinary calculi: Secondary | ICD-10-CM | POA: Insufficient documentation

## 2017-07-14 DIAGNOSIS — Z79899 Other long term (current) drug therapy: Secondary | ICD-10-CM | POA: Diagnosis not present

## 2017-07-14 DIAGNOSIS — Z86711 Personal history of pulmonary embolism: Secondary | ICD-10-CM | POA: Insufficient documentation

## 2017-07-14 DIAGNOSIS — Z923 Personal history of irradiation: Secondary | ICD-10-CM | POA: Diagnosis not present

## 2017-07-14 DIAGNOSIS — I700 Atherosclerosis of aorta: Secondary | ICD-10-CM | POA: Insufficient documentation

## 2017-07-14 DIAGNOSIS — Z7901 Long term (current) use of anticoagulants: Secondary | ICD-10-CM | POA: Insufficient documentation

## 2017-07-14 LAB — COMPREHENSIVE METABOLIC PANEL
ALT: 29 U/L (ref 17–63)
AST: 27 U/L (ref 15–41)
Albumin: 3.6 g/dL (ref 3.5–5.0)
Alkaline Phosphatase: 93 U/L (ref 38–126)
Anion gap: 9 (ref 5–15)
BUN: 16 mg/dL (ref 6–20)
CO2: 22 mmol/L (ref 22–32)
Calcium: 8.9 mg/dL (ref 8.9–10.3)
Chloride: 105 mmol/L (ref 101–111)
Creatinine, Ser: 0.8 mg/dL (ref 0.61–1.24)
GFR calc Af Amer: >60 mL/min (ref >60)
GFR calc non Af Amer: >60 mL/min (ref >60)
Glucose, Bld: 273 mg/dL — ABNORMAL HIGH (ref 65–99)
Potassium: 4.8 mmol/L (ref 3.5–5.1)
Sodium: 136 mmol/L (ref 135–145)
Total Bilirubin: 1.2 mg/dL (ref 0.3–1.2)
Total Protein: 6.4 g/dL — ABNORMAL LOW (ref 6.5–8.1)

## 2017-07-14 LAB — CBC WITH DIFFERENTIAL/PLATELET
Basophils Absolute: 0 10*3/uL (ref 0.0–0.1)
Basophils Relative: 1 %
Eosinophils Absolute: 0.1 10*3/uL (ref 0.0–0.7)
Eosinophils Relative: 2 %
HCT: 41.5 % (ref 39.0–52.0)
Hemoglobin: 13.3 g/dL (ref 13.0–17.0)
Lymphocytes Relative: 28 %
Lymphs Abs: 1.7 10*3/uL (ref 0.7–4.0)
MCH: 29.9 pg (ref 26.0–34.0)
MCHC: 32 g/dL (ref 30.0–36.0)
MCV: 93.3 fL (ref 78.0–100.0)
Monocytes Absolute: 0.5 10*3/uL (ref 0.1–1.0)
Monocytes Relative: 8 %
Neutro Abs: 3.7 10*3/uL (ref 1.7–7.7)
Neutrophils Relative %: 61 %
Platelets: 177 10*3/uL (ref 150–400)
RBC: 4.45 MIL/uL (ref 4.22–5.81)
RDW: 15.1 % (ref 11.5–15.5)
WBC: 6.1 10*3/uL (ref 4.0–10.5)

## 2017-07-14 LAB — LACTATE DEHYDROGENASE: LDH: 161 U/L (ref 98–192)

## 2017-07-14 LAB — URINALYSIS, DIPSTICK ONLY
Bilirubin Urine: NEGATIVE
Glucose, UA: 250 mg/dL — AB
Hgb urine dipstick: NEGATIVE
Ketones, ur: NEGATIVE mg/dL
Leukocytes, UA: NEGATIVE
Nitrite: NEGATIVE
Protein, ur: NEGATIVE mg/dL
Specific Gravity, Urine: >1.03 — ABNORMAL HIGH (ref 1.005–1.030)
pH: 5.5 (ref 5.0–8.0)

## 2017-07-14 MED ORDER — DEXAMETHASONE SODIUM PHOSPHATE 10 MG/ML IJ SOLN
10.0000 mg | Freq: Once | INTRAMUSCULAR | Status: AC
  Administered 2017-07-14: 10 mg via INTRAVENOUS
  Filled 2017-07-14: qty 1

## 2017-07-14 MED ORDER — SODIUM CHLORIDE 0.9 % IV SOLN
1800.0000 mg/m2 | INTRAVENOUS | Status: DC
  Administered 2017-07-14: 3850 mg via INTRAVENOUS
  Filled 2017-07-14: qty 77

## 2017-07-14 MED ORDER — SODIUM CHLORIDE 0.9 % IV SOLN
INTRAVENOUS | Status: DC
  Administered 2017-07-14: 10:00:00 via INTRAVENOUS

## 2017-07-14 MED ORDER — SODIUM CHLORIDE 0.9 % IV SOLN
500.0000 mg | Freq: Once | INTRAVENOUS | Status: AC
  Administered 2017-07-14: 500 mg via INTRAVENOUS
  Filled 2017-07-14: qty 4

## 2017-07-14 MED ORDER — DIPHENHYDRAMINE HCL 50 MG/ML IJ SOLN
50.0000 mg | Freq: Once | INTRAMUSCULAR | Status: AC
  Administered 2017-07-14: 50 mg via INTRAVENOUS
  Filled 2017-07-14: qty 1

## 2017-07-14 MED ORDER — CAPECITABINE 500 MG PO TABS: 2000.0000 mg | ORAL_TABLET | Freq: Two times a day (BID) | ORAL | 0 refills | Status: AC

## 2017-07-14 MED ORDER — FLUOROURACIL CHEMO INJECTION 2.5 GM/50ML
300.0000 mg/m2 | Freq: Once | INTRAVENOUS | Status: AC
  Administered 2017-07-14: 650 mg via INTRAVENOUS
  Filled 2017-07-14: qty 13

## 2017-07-14 MED ORDER — LEUCOVORIN CALCIUM INJECTION 350 MG
650.0000 mg | Freq: Once | INTRAVENOUS | Status: AC
  Administered 2017-07-14: 650 mg via INTRAVENOUS
  Filled 2017-07-14: qty 32.5

## 2017-07-14 MED ORDER — PALONOSETRON HCL INJECTION 0.25 MG/5ML
0.2500 mg | Freq: Once | INTRAVENOUS | Status: AC
  Administered 2017-07-14: 0.25 mg via INTRAVENOUS
  Filled 2017-07-14: qty 5

## 2017-07-14 MED ORDER — SODIUM CHLORIDE 0.9% FLUSH
10.0000 mL | INTRAVENOUS | Status: DC | PRN
  Administered 2017-07-14: 10 mL
  Filled 2017-07-14: qty 10

## 2017-07-14 MED FILL — XELODA 500 MG TABLET: 500 | 14 days supply | Qty: 112 | Fill #0

## 2017-07-14 NOTE — Patient Instructions (Signed)
[facility] Discharge Instructions for Patients Receiving Chemotherapy   Beginning January 23rd 2017 lab work for the Cancer Center will be done in the  Main lab at [place] on 1st floor. If you have a lab appointment with the Cancer Center please come in thru the  Main Entrance and check in at the main information desk   Today you received the following chemotherapy agents   To help prevent nausea and vomiting after your treatment, we encourage you to take your nausea medication     If you develop nausea and vomiting, or diarrhea that is not controlled by your medication, call the clinic.  The clinic phone number is [phone]. Office hours are Monday-Friday 8:30am-5:00pm.  BELOW ARE SYMPTOMS THAT SHOULD BE REPORTED IMMEDIATELY:  *FEVER GREATER THAN 101.0 F  *CHILLS WITH OR WITHOUT FEVER  NAUSEA AND VOMITING THAT IS NOT CONTROLLED WITH YOUR NAUSEA MEDICATION  *UNUSUAL SHORTNESS OF BREATH  *UNUSUAL BRUISING OR BLEEDING  TENDERNESS IN MOUTH AND THROAT WITH OR WITHOUT PRESENCE OF ULCERS  *URINARY PROBLEMS  *BOWEL PROBLEMS  UNUSUAL RASH Items with * indicate a potential emergency and should be followed up as soon as possible. If you have an emergency after office hours please contact your primary care physician or go to the nearest emergency department.  Please call the clinic during office hours if you have any questions or concerns.   You may also contact the Patient Navigator at [phone] should you have any questions or need assistance in obtaining follow up care.      Resources For Cancer Patients and their Caregivers ? American Cancer Society: Can assist with transportation, wigs, general needs, runs Look Good Feel Better.        1-888-227-6333 ? Cancer Care: Provides financial assistance, online support groups, medication/co-pay assistance.  1-800-813-HOPE (4673) ? [facility] Assists [place] cancer  patients and their families through emotional , educational and financial support.  336-427-4357 ? [place] DSS Where to apply for food stamps, Medicaid and utility assistance. 336-342-1394 ? [facility]: Transportation to medical appointments. 336-347-2287 ? Social Security Administration: May apply for disability if have a Stage IV cancer. 336-342-7796 1-800-772-1213 ? [place] Aging, Disability and Transit Services: Assists with nutrition, care and transit needs. 336-349-2343         

## 2017-07-14 NOTE — Progress Notes (Signed)
Labs reviewed by MD. Will proceed with treatment.  Continuous pump administered per protocol.   Treatment given per orders. Patient tolerated it well without problems. Vitals stable and discharged home from clinic ambulatory. Follow up as scheduled.

## 2017-07-15 ENCOUNTER — Telehealth (HOSPITAL_COMMUNITY): Payer: Self-pay | Admitting: Emergency Medicine

## 2017-07-15 ENCOUNTER — Other Ambulatory Visit (HOSPITAL_COMMUNITY): Payer: Self-pay | Admitting: Pharmacist

## 2017-07-15 NOTE — Telephone Encounter (Signed)
Called pt to teach him over the phone about him starting the new chemo pill Xeloda.  He will start this on March 26th.  He is getting this through [facility].  Explained the side effects and when to call the clinic.  He should take the Xeloda for 14 days on and 7 days off.  He will take 4 tablets (2000 mg) in the am with in 30 minutes of breakfast and 4 tablets (2000 mg) in the pm with in 30 minutes of dinner.  He will be provided with the information sheets when he comes in to have his pump removed tomorrow with specific instructions of everything we went over today.  All questions answered.

## 2017-07-16 ENCOUNTER — Inpatient Hospital Stay (HOSPITAL_COMMUNITY): Payer: Medicare Other

## 2017-07-16 ENCOUNTER — Other Ambulatory Visit (HOSPITAL_COMMUNITY): Payer: Self-pay | Admitting: *Deleted

## 2017-07-16 ENCOUNTER — Encounter (HOSPITAL_COMMUNITY): Payer: Self-pay

## 2017-07-16 VITALS — BP 135/78 | HR 82 | Temp 98.0°F | Resp 16

## 2017-07-16 DIAGNOSIS — C20 Malignant neoplasm of rectum: Secondary | ICD-10-CM | POA: Diagnosis not present

## 2017-07-16 DIAGNOSIS — I10 Essential (primary) hypertension: Secondary | ICD-10-CM | POA: Diagnosis not present

## 2017-07-16 DIAGNOSIS — C7889 Secondary malignant neoplasm of other digestive organs: Secondary | ICD-10-CM | POA: Diagnosis not present

## 2017-07-16 DIAGNOSIS — C7801 Secondary malignant neoplasm of right lung: Secondary | ICD-10-CM | POA: Diagnosis not present

## 2017-07-16 DIAGNOSIS — Z923 Personal history of irradiation: Secondary | ICD-10-CM | POA: Diagnosis not present

## 2017-07-16 DIAGNOSIS — Z5111 Encounter for antineoplastic chemotherapy: Secondary | ICD-10-CM | POA: Diagnosis not present

## 2017-07-16 MED ORDER — HEPARIN SOD (PORK) LOCK FLUSH 100 UNIT/ML IV SOLN
500.0000 [IU] | Freq: Once | INTRAVENOUS | Status: AC
  Administered 2017-07-16: 500 [IU] via INTRAVENOUS

## 2017-07-16 MED ORDER — HEPARIN SOD (PORK) LOCK FLUSH 100 UNIT/ML IV SOLN
INTRAVENOUS | Status: AC
  Filled 2017-07-16: qty 5

## 2017-07-16 MED ORDER — SODIUM CHLORIDE 0.9% FLUSH
10.0000 mL | Freq: Once | INTRAVENOUS | Status: AC
  Administered 2017-07-16: 10 mL via INTRAVENOUS

## 2017-07-16 NOTE — Progress Notes (Signed)
Patient for 5FU pump removal.  Port site clean and dry with no bruising or swelling noted at site.  Port flushed with no complaints of pain.  Band aid applied.  New consent signed for avastin and xeloda.  Information for oral xeloda given with all questions asked and answered.  VSS with discharge and left ambulatory.  No s/s of distress noted and wife at side.

## 2017-07-16 NOTE — Patient Instructions (Signed)
[place] at [facility]  Discharge Instructions:  Your 5FU pump was removed today.  _______________________________________________________________  Thank you for choosing [place] at [facility] to provide your oncology and hematology care.  To afford each patient quality time with our providers, please arrive at least 15 minutes before your scheduled appointment.  You need to re-schedule your appointment if you arrive 10 or more minutes late.  We strive to give you quality time with our providers, and arriving late affects you and other patients whose appointments are after yours.  Also, if you no show three or more times for appointments you may be dismissed from the clinic.  Again, thank you for choosing [facility] [place] hope is that these requests will allow you access to exceptional care and in a timely manner. _______________________________________________________________  If you have questions after your visit, please contact our office at [phone] between the hours of 8:30 a.m. and 5:00 p.m. Voicemails left after 4:30 p.m. will not be returned until the following business day. _______________________________________________________________  For prescription refill requests, have your pharmacy contact our office. _______________________________________________________________  Recommendations made by the consultant and any test results will be sent to your referring physician. _______________________________________________________________

## 2017-07-24 ENCOUNTER — Other Ambulatory Visit (HOSPITAL_COMMUNITY): Payer: Self-pay | Admitting: Oncology

## 2017-07-24 DIAGNOSIS — T451X5A Adverse effect of antineoplastic and immunosuppressive drugs, initial encounter: Principal | ICD-10-CM

## 2017-07-24 DIAGNOSIS — Z6826 Body mass index (BMI) 26.0-26.9, adult: Secondary | ICD-10-CM | POA: Diagnosis not present

## 2017-07-24 DIAGNOSIS — R509 Fever, unspecified: Secondary | ICD-10-CM | POA: Diagnosis not present

## 2017-07-24 DIAGNOSIS — I82409 Acute embolism and thrombosis of unspecified deep veins of unspecified lower extremity: Secondary | ICD-10-CM | POA: Diagnosis not present

## 2017-07-24 DIAGNOSIS — J069 Acute upper respiratory infection, unspecified: Secondary | ICD-10-CM | POA: Diagnosis not present

## 2017-07-24 DIAGNOSIS — E1165 Type 2 diabetes mellitus with hyperglycemia: Secondary | ICD-10-CM | POA: Diagnosis not present

## 2017-07-24 DIAGNOSIS — I2699 Other pulmonary embolism without acute cor pulmonale: Secondary | ICD-10-CM | POA: Diagnosis not present

## 2017-07-24 DIAGNOSIS — Z299 Encounter for prophylactic measures, unspecified: Secondary | ICD-10-CM | POA: Diagnosis not present

## 2017-07-24 DIAGNOSIS — G620 Drug-induced polyneuropathy: Secondary | ICD-10-CM

## 2017-07-24 DIAGNOSIS — C3490 Malignant neoplasm of unspecified part of unspecified bronchus or lung: Secondary | ICD-10-CM | POA: Diagnosis not present

## 2017-07-24 DIAGNOSIS — Z789 Other specified health status: Secondary | ICD-10-CM | POA: Diagnosis not present

## 2017-07-27 ENCOUNTER — Telehealth (HOSPITAL_COMMUNITY): Payer: Self-pay | Admitting: Emergency Medicine

## 2017-07-27 DIAGNOSIS — G620 Drug-induced polyneuropathy: Secondary | ICD-10-CM

## 2017-07-27 DIAGNOSIS — K1379 Other lesions of oral mucosa: Secondary | ICD-10-CM

## 2017-07-27 DIAGNOSIS — T451X5A Adverse effect of antineoplastic and immunosuppressive drugs, initial encounter: Principal | ICD-10-CM

## 2017-07-27 DIAGNOSIS — C20 Malignant neoplasm of rectum: Secondary | ICD-10-CM

## 2017-07-27 MED ORDER — GABAPENTIN 300 MG PO CAPS: 900.0000 mg | ORAL_CAPSULE | Freq: Two times a day (BID) | ORAL | 1 refills | Status: DC

## 2017-07-27 MED ORDER — FIRST-DUKES MOUTHWASH MT SUSP: 5.0000 mL | Freq: Four times a day (QID) | OROMUCOSAL | 3 refills | Status: DC

## 2017-07-27 NOTE — Telephone Encounter (Signed)
Pts wife called and wanted to know if pt only had a biscuit for breakfast would that be enough for him to take his Xeloda afterwards.  Yes he can take his Xeloda after that.  They understand to take 4 pills after breakfast in am and 4 pills after dinner in the pm.  He will start 3/26.  Called in refills for gabapentin and dukes mouth wash to [facility].

## 2017-07-28 ENCOUNTER — Telehealth (HOSPITAL_COMMUNITY): Payer: Self-pay | Admitting: Emergency Medicine

## 2017-07-28 ENCOUNTER — Ambulatory Visit (HOSPITAL_COMMUNITY): Payer: Medicare Other | Admitting: Hematology

## 2017-07-28 ENCOUNTER — Ambulatory Visit (HOSPITAL_COMMUNITY): Payer: Medicare Other

## 2017-07-28 ENCOUNTER — Other Ambulatory Visit (HOSPITAL_COMMUNITY): Payer: Medicare Other

## 2017-07-28 DIAGNOSIS — R918 Other nonspecific abnormal finding of lung field: Secondary | ICD-10-CM | POA: Diagnosis not present

## 2017-07-28 DIAGNOSIS — R0602 Shortness of breath: Secondary | ICD-10-CM | POA: Diagnosis not present

## 2017-07-28 DIAGNOSIS — K219 Gastro-esophageal reflux disease without esophagitis: Secondary | ICD-10-CM | POA: Diagnosis not present

## 2017-07-28 DIAGNOSIS — G629 Polyneuropathy, unspecified: Secondary | ICD-10-CM | POA: Diagnosis not present

## 2017-07-28 DIAGNOSIS — Z299 Encounter for prophylactic measures, unspecified: Secondary | ICD-10-CM | POA: Diagnosis not present

## 2017-07-28 DIAGNOSIS — E1165 Type 2 diabetes mellitus with hyperglycemia: Secondary | ICD-10-CM | POA: Diagnosis not present

## 2017-07-28 DIAGNOSIS — J189 Pneumonia, unspecified organism: Secondary | ICD-10-CM | POA: Diagnosis not present

## 2017-07-28 DIAGNOSIS — R05 Cough: Secondary | ICD-10-CM | POA: Diagnosis not present

## 2017-07-28 DIAGNOSIS — Z6826 Body mass index (BMI) 26.0-26.9, adult: Secondary | ICD-10-CM | POA: Diagnosis not present

## 2017-07-28 NOTE — Telephone Encounter (Signed)
Spoke with Mike Craze NP about pt starting antibiotics from his PCP for suspected pneumonia.  Pt has been sent for Chest x-ray to confirm but have not been called with the results yet.  Pt to hold Xeloda until finished with antibiotics.  Pt to call and let us know when he is finished with the antibiotics and ,ake sure he is not having any more cold symptoms.  We will then start his Xeloda.  Pts wife verbalized understanding.

## 2017-07-28 NOTE — Telephone Encounter (Signed)
Pts wife called and stated that pt still does not feel good.  Having chills and nausea.  Pt has an appt to see PCP today at 1:45 pm.  Told not to start Xeloda this am. Will call today with the update after pt has been seen by PCP.

## 2017-07-30 ENCOUNTER — Encounter (HOSPITAL_COMMUNITY): Payer: Medicare Other

## 2017-08-04 ENCOUNTER — Ambulatory Visit (HOSPITAL_COMMUNITY): Payer: Medicare Other

## 2017-08-04 ENCOUNTER — Ambulatory Visit (HOSPITAL_COMMUNITY): Payer: Medicare Other | Admitting: Hematology

## 2017-08-04 ENCOUNTER — Other Ambulatory Visit (HOSPITAL_COMMUNITY): Payer: Medicare Other

## 2017-08-11 ENCOUNTER — Inpatient Hospital Stay (HOSPITAL_COMMUNITY): Payer: Medicare Other | Attending: Internal Medicine

## 2017-08-11 ENCOUNTER — Ambulatory Visit (HOSPITAL_COMMUNITY): Payer: Medicare Other | Admitting: Hematology

## 2017-08-11 ENCOUNTER — Encounter (HOSPITAL_COMMUNITY): Payer: Self-pay | Admitting: Hematology

## 2017-08-11 ENCOUNTER — Ambulatory Visit (HOSPITAL_COMMUNITY): Payer: Medicare Other

## 2017-08-11 ENCOUNTER — Inpatient Hospital Stay (HOSPITAL_COMMUNITY): Payer: Medicare Other

## 2017-08-11 ENCOUNTER — Other Ambulatory Visit (HOSPITAL_COMMUNITY): Payer: Medicare Other

## 2017-08-11 ENCOUNTER — Inpatient Hospital Stay (HOSPITAL_BASED_OUTPATIENT_CLINIC_OR_DEPARTMENT_OTHER): Payer: Medicare Other | Admitting: Hematology

## 2017-08-11 VITALS — BP 145/84 | HR 109 | Temp 97.7°F | Resp 16 | Wt 206.4 lb

## 2017-08-11 DIAGNOSIS — K219 Gastro-esophageal reflux disease without esophagitis: Secondary | ICD-10-CM | POA: Diagnosis not present

## 2017-08-11 DIAGNOSIS — Z86711 Personal history of pulmonary embolism: Secondary | ICD-10-CM | POA: Diagnosis not present

## 2017-08-11 DIAGNOSIS — C7801 Secondary malignant neoplasm of right lung: Secondary | ICD-10-CM

## 2017-08-11 DIAGNOSIS — K2970 Gastritis, unspecified, without bleeding: Secondary | ICD-10-CM | POA: Diagnosis not present

## 2017-08-11 DIAGNOSIS — Z87442 Personal history of urinary calculi: Secondary | ICD-10-CM | POA: Diagnosis not present

## 2017-08-11 DIAGNOSIS — C20 Malignant neoplasm of rectum: Secondary | ICD-10-CM

## 2017-08-11 DIAGNOSIS — Z923 Personal history of irradiation: Secondary | ICD-10-CM

## 2017-08-11 DIAGNOSIS — C7889 Secondary malignant neoplasm of other digestive organs: Secondary | ICD-10-CM | POA: Diagnosis not present

## 2017-08-11 DIAGNOSIS — G893 Neoplasm related pain (acute) (chronic): Secondary | ICD-10-CM | POA: Insufficient documentation

## 2017-08-11 DIAGNOSIS — Z5112 Encounter for antineoplastic immunotherapy: Secondary | ICD-10-CM | POA: Insufficient documentation

## 2017-08-11 DIAGNOSIS — Z7984 Long term (current) use of oral hypoglycemic drugs: Secondary | ICD-10-CM | POA: Insufficient documentation

## 2017-08-11 DIAGNOSIS — J209 Acute bronchitis, unspecified: Secondary | ICD-10-CM | POA: Diagnosis not present

## 2017-08-11 DIAGNOSIS — Z7901 Long term (current) use of anticoagulants: Secondary | ICD-10-CM | POA: Diagnosis not present

## 2017-08-11 DIAGNOSIS — E1151 Type 2 diabetes mellitus with diabetic peripheral angiopathy without gangrene: Secondary | ICD-10-CM | POA: Insufficient documentation

## 2017-08-11 DIAGNOSIS — K449 Diaphragmatic hernia without obstruction or gangrene: Secondary | ICD-10-CM | POA: Diagnosis not present

## 2017-08-11 DIAGNOSIS — E1165 Type 2 diabetes mellitus with hyperglycemia: Secondary | ICD-10-CM | POA: Diagnosis not present

## 2017-08-11 DIAGNOSIS — I700 Atherosclerosis of aorta: Secondary | ICD-10-CM | POA: Insufficient documentation

## 2017-08-11 DIAGNOSIS — K381 Appendicular concretions: Secondary | ICD-10-CM | POA: Diagnosis not present

## 2017-08-11 DIAGNOSIS — F329 Major depressive disorder, single episode, unspecified: Secondary | ICD-10-CM | POA: Insufficient documentation

## 2017-08-11 DIAGNOSIS — Z79899 Other long term (current) drug therapy: Secondary | ICD-10-CM

## 2017-08-11 DIAGNOSIS — C7800 Secondary malignant neoplasm of unspecified lung: Secondary | ICD-10-CM | POA: Diagnosis not present

## 2017-08-11 DIAGNOSIS — I10 Essential (primary) hypertension: Secondary | ICD-10-CM | POA: Diagnosis not present

## 2017-08-11 DIAGNOSIS — C189 Malignant neoplasm of colon, unspecified: Secondary | ICD-10-CM | POA: Diagnosis not present

## 2017-08-11 DIAGNOSIS — Z299 Encounter for prophylactic measures, unspecified: Secondary | ICD-10-CM | POA: Diagnosis not present

## 2017-08-11 LAB — CBC WITH DIFFERENTIAL/PLATELET
Basophils Absolute: 0 10*3/uL (ref 0.0–0.1)
Basophils Relative: 0 %
Eosinophils Absolute: 0 10*3/uL (ref 0.0–0.7)
Eosinophils Relative: 1 %
HCT: 42.7 % (ref 39.0–52.0)
Hemoglobin: 13.5 g/dL (ref 13.0–17.0)
Lymphocytes Relative: 25 %
Lymphs Abs: 1.6 10*3/uL (ref 0.7–4.0)
MCH: 30 pg (ref 26.0–34.0)
MCHC: 31.6 g/dL (ref 30.0–36.0)
MCV: 94.9 fL (ref 78.0–100.0)
Monocytes Absolute: 0.4 10*3/uL (ref 0.1–1.0)
Monocytes Relative: 7 %
Neutro Abs: 4.5 10*3/uL (ref 1.7–7.7)
Neutrophils Relative %: 67 %
Platelets: 200 10*3/uL (ref 150–400)
RBC: 4.5 MIL/uL (ref 4.22–5.81)
RDW: 15.3 % (ref 11.5–15.5)
WBC: 6.6 10*3/uL (ref 4.0–10.5)

## 2017-08-11 LAB — URINALYSIS, DIPSTICK ONLY
Bilirubin Urine: NEGATIVE
Glucose, UA: 150 mg/dL — AB
Hgb urine dipstick: NEGATIVE
Ketones, ur: NEGATIVE mg/dL
Leukocytes, UA: NEGATIVE
Nitrite: NEGATIVE
Protein, ur: NEGATIVE mg/dL
Specific Gravity, Urine: 1.024 (ref 1.005–1.030)
pH: 5 (ref 5.0–8.0)

## 2017-08-11 LAB — COMPREHENSIVE METABOLIC PANEL
ALT: 20 U/L (ref 17–63)
AST: 23 U/L (ref 15–41)
Albumin: 3.2 g/dL — ABNORMAL LOW (ref 3.5–5.0)
Alkaline Phosphatase: 66 U/L (ref 38–126)
Anion gap: 13 (ref 5–15)
BUN: 12 mg/dL (ref 6–20)
CO2: 23 mmol/L (ref 22–32)
Calcium: 8.9 mg/dL (ref 8.9–10.3)
Chloride: 104 mmol/L (ref 101–111)
Creatinine, Ser: 0.85 mg/dL (ref 0.61–1.24)
GFR calc Af Amer: >60 mL/min (ref >60)
GFR calc non Af Amer: >60 mL/min (ref >60)
Glucose, Bld: 325 mg/dL — ABNORMAL HIGH (ref 65–99)
Potassium: 4.3 mmol/L (ref 3.5–5.1)
Sodium: 140 mmol/L (ref 135–145)
Total Bilirubin: 1.4 mg/dL — ABNORMAL HIGH (ref 0.3–1.2)
Total Protein: 6.7 g/dL (ref 6.5–8.1)

## 2017-08-11 MED ORDER — SODIUM CHLORIDE 0.9 % IV SOLN
7.3000 mg/kg | Freq: Once | INTRAVENOUS | Status: AC
  Administered 2017-08-11: 700 mg via INTRAVENOUS
  Filled 2017-08-11: qty 16

## 2017-08-11 MED ORDER — HEPARIN SOD (PORK) LOCK FLUSH 100 UNIT/ML IV SOLN
500.0000 [IU] | Freq: Once | INTRAVENOUS | Status: AC | PRN
  Administered 2017-08-11: 500 [IU]

## 2017-08-11 MED ORDER — SODIUM CHLORIDE 0.9 % IV SOLN
Freq: Once | INTRAVENOUS | Status: AC
  Administered 2017-08-11: 12:00:00 via INTRAVENOUS

## 2017-08-11 NOTE — Assessment & Plan Note (Signed)
1.  Stage IV rectal cancer with lung and pancreatic metastasis: He was on maintenance 5-FU and bevacizumab.  He started Xeloda first cycle on 08/07/2017 and is taking 4 pills twice a day.  He is on a 2 weeks on 1 week of regimen.  He will continue maintenance bevacizumab once every 3 weeks.  He will proceed with bevacizumab today.  I do not know if foundation 1 testing was done on this patient for various mutations.  I will also plan to do scans after the next cycle.  2.  Neuropathy: He has continues numbness and tingling in the hands and feet.  He is taking gabapentin 900 mg twice daily.  He gets pains if he does not take gabapentin.  3.  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  He will continue Xarelto 20 mg daily.  4.  He complained of right posterior rib pain on and off for the last 3 weeks.  He also has some pain over the right shoulder since this morning.  We will keep a close eye on it.  He is still working Haematologist.

## 2017-08-11 NOTE — Progress Notes (Signed)
Tolerated infusion w/o adverse reaction.  Alert, in no distress.  Discharged ambulatory in c/o spouse.

## 2017-08-11 NOTE — Progress Notes (Signed)
Charles [name], [name] [id number]   CLINIC:  Medical Oncology/Hematology  PCP:  [name], MD [place] [id number] [phone]   REASON FOR VISIT:  Follow-up for metastatic rectal cancer.  CURRENT THERAPY: Xeloda and Avastin.  BRIEF ONCOLOGIC HISTORY:    Rectal cancer ([place])   07/24/2010 Initial Diagnosis    Invasive adenocarcinoma of rectum      09/09/2010 Concurrent Chemotherapy    S/P radiation and concurrent 5-FU continuous infusion from 09/09/10- 10/10/10.      11/14/2010 Surgery    S/P proctectomy with colorectal anastomosis and diverting loop ileostomy on 11/14/10 at [facility] by Dr. Harlon Ditty. Pathology reveals a pT3b N1 with 3/20 lymph nodes.      02/05/2011 - 07/14/2011 Chemotherapy    FOLFOX      08/18/2011 Surgery    Approximate date of surgery- [place] by Dr. Harlon Ditty       Remission         11/24/2013 Survivorship    Colonoscopy- somewhat fibrotic/friable anastomotic mucosal-status post biopsy (narrowing not felt to be clinically significant).  Negative pathology for malignancy      09/12/2014 Imaging    DVT in the left femoral venous system, left common iliac vein, IVC, and within the IVC filter Right lower extremity venography confirms chronic occlusion of the femoral venous system with collateralization. The right iliac venous system is patent and do      09/25/2014 PET scan    The right middle lobe pulmonary nodule is hypermetabolic, favored to represent a primary bronchogenic carcinoma.Equivocal mediastinal nodes, similar to surrounding blood pool. Bilateral adrenal hypermetabolism, felt to be physiologic      10/24/2014 Pathology Results    Lung, needle/core biopsy(ies), RML - ADENOCARCINOMA, SEE COMMENT metastatic adenocarcinoma of a colorectal primary      10/24/2014 Relapse/Recurrence         11/16/2014 Definitive Surgery    Bronchoscopy, right video-assisted thoracoscopy, wedge resection of  right middle lobe by Dr. Servando Snare      11/16/2014 Pathology Results    Lung, wedge biopsy/resection, right lingula and small portion of middle lobe - METASTATIC ADENOCARCINOMA, CONSISTENT WITH COLORECTAL PRIMARY, SPANNING 2.0 CM. - THE SURGICAL RESECTION MARGINS ARE NEGATIVE FOR ADENOCARCINOMA.      11/16/2014 Remission    THE SURGICAL RESECTION MARGINS ARE NEGATIVE FOR ADENOCARCINOMA.      03/07/2015 Imaging    CT CAP- Interval resection of right middle lobe metastasis. No acute process or evidence of metastatic disease in the chest, abdomen or pelvis. Improved right upper lobe reticular nodular opacities are favored to represent resolving infection.      09/14/2015 Imaging    CT CAP- No findings of recurrent malignancy. No recurrence along the wedge resection site of the right middle lobe. Right anterior abdominal wall focal hernia containing a knuckle of small bowel without complicating feature.      06/13/2016 Imaging    CT CAP- 1. New right-sided pleural metastasis/mass. Other smaller right-sided pulmonary nodules which are all pleural-based and most likely represent pleural metastasis. 2. No convincing evidence of abdominopelvic nodal metastasis. 3. Constellation of findings, including pancreatic atrophy, duct dilatation, and pancreatic head soft tissue fullness which are highly suspicious for pancreatic adenocarcinoma. Metastatic disease felt much less likely. Consider endoscopic ultrasound sampling or ERCP. Cannot exclude superimposed acute pancreatitis. 4. New enlargement of the appendiceal tip with subtle surrounding edema. Cannot exclude early or mild appendicitis. 5. Coronary artery  atherosclerosis. Aortic atherosclerosis. 6. 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      06/13/2016 Progression    CT scan demonstrates progression of disease      06/19/2016 Procedure    EUS with FNA by Dr. Ardis Hughs      06/20/2016 Pathology Results    FINE NEEDLE  ASPIRATION, ENDOSCOPIC, PANCREAS UNCINATE AREA(SPECIMEN 1 OF 1 COLLECTED 06/19/16): MALIGNANT CELLS CONSISTENT WITH METASTATIC ADENOCARCINOMA.      06/25/2016 PET scan    1. Two pleural-based nodules in the right hemithorax are hypermetabolic. Metastatic disease is a distinct consideration. The scattered pulmonary parenchymal nodule seen on previous diagnostic CT imaging are below the threshold for reliable resolution on PET imaging. 2. Hypermetabolic lesion pancreatic head, consistent with neoplasm. As noted on prior CT, adenocarcinoma is any consideration. No evidence for hypermetabolic abdominal lymphadenopathy. Mottled uptake noted in the liver, but no discrete hepatic metastases are evident on PET imaging. 3. Appendix remains distended up to 0.9-10 mm diameter with a stone towards the tip in subtle periappendiceal edema/inflammation. The appendix is hypermetabolic along its length. Imaging features are relatively stable in the 12 day interval since prior CT scan. While appendicitis is a consideration, the relative stability over 12 days would be unusual for that etiology. Continued close follow-up recommended.      06/26/2016 Pathology Results    Not enough tissue for foundationONE or K-ras testing.      07/07/2016 Procedure    Port placed by Dr. Arnoldo Morale      07/08/2016 -  Chemotherapy    The patient had pegfilgrastim (NEULASTA) injection 6 mg, 6 mg, Subcutaneous,  Once, 0 of 4 cycles  ondansetron (ZOFRAN) IVPB 8 mg, 8 mg, Intravenous,  Once, 0 of 4 cycles  leucovorin 804 mg in dextrose 5 % 250 mL infusion, 400 mg/m2, Intravenous,  Once, 0 of 4 cycles  oxaliplatin (ELOXATIN) 170 mg in dextrose 5 % 500 mL chemo infusion, 85 mg/m2, Intravenous,  Once, 0 of 4 cycles  fluorouracil (ADRUCIL) 4,800 mg in sodium chloride 0.9 % 150 mL chemo infusion, 2,400 mg/m2 = 4,800 mg, Intravenous, 1 Day/Dose, 0 of 4 cycles  fluorouracil (ADRUCIL) chemo injection 800 mg, 400 mg/m2, Intravenous,   Once, 0 of 4 cycles  pegfilgrastim (NEULASTA) injection 6 mg, 6 mg, Subcutaneous,  Once, 2 of 2 cycles  ondansetron (ZOFRAN) IVPB 8 mg, 8 mg, Intravenous,  Once, 12 of 12 cycles  leucovorin 660 mg in dextrose 5 % 250 mL infusion, 660 mg (original dose ), Intravenous,  Once, 12 of 12 cycles Dose modification: 660 mg (Cycle 1)  oxaliplatin (ELOXATIN) 140 mg in dextrose 5 % 500 mL chemo infusion, 140 mg (original dose ), Intravenous,  Once, 12 of 12 cycles Dose modification: 140 mg (Cycle 1), 119 mg (85 % of original dose 140 mg, Cycle 8, Reason: Provider Judgment)  fluorouracil (ADRUCIL) 3,850 mg in sodium chloride 0.9 % 150 mL chemo infusion, 3,850 mg (original dose ), Intravenous, 1 Day/Dose, 12 of 12 cycles Dose modification: 3,850 mg (Cycle 1)  fluorouracil (ADRUCIL) chemo injection 700 mg, 700 mg (original dose ), Intravenous,  Once, 12 of 12 cycles Dose modification: 700 mg (Cycle 1)  palonosetron (ALOXI) injection 0.25 mg, 0.25 mg, Intravenous,  Once, 7 of 12 cycles Administration: 0.25 mg (09/30/2016)  bevacizumab (AVASTIN) 475 mg in sodium chloride 0.9 % 100 mL chemo infusion, 5 mg/kg = 475 mg, Intravenous,  Once, 6 of 11 cycles Administration: 500 mg (09/30/2016)  leucovorin 880  mg in dextrose 5 % 250 mL infusion, 400 mg/m2 = 880 mg, Intravenous,  Once, 7 of 12 cycles Administration: 880 mg (09/30/2016)  oxaliplatin (ELOXATIN) 185 mg in dextrose 5 % 500 mL chemo infusion, 85 mg/m2 = 185 mg, Intravenous,  Once, 7 of 12 cycles Administration: 185 mg (09/30/2016)  fluorouracil (ADRUCIL) chemo injection 900 mg, 400 mg/m2 = 900 mg, Intravenous,  Once, 7 of 12 cycles Administration: 900 mg (09/30/2016)  fluorouracil (ADRUCIL) 5,300 mg in sodium chloride 0.9 % 144 mL chemo infusion, 2,400 mg/m2 = 5,300 mg, Intravenous, 1 Day/Dose, 7 of 12 cycles Administration: 5,300 mg (09/30/2016)  for chemotherapy treatment.        08/27/2016 Imaging    CT CAP- 1. Pulmonary metastatic disease is  stable. 2. Pancreatic head mass, grossly stable, with progressive atrophy of the body and tail of the pancreas. Stable peripancreatic lymph node. 3. Mild circumferential rectal wall thickening, stable. 4. Aortic atherosclerosis (ICD10-170.0). Coronary artery calcification. 5. Hyperattenuating lesion off the lower pole left kidney, stable, too small to characterize. Continued attention on followup exams is warranted. 6. Persistent wall thickening and mild dilatation of the proximal appendix, of uncertain etiology. Continued attention on followup exams is warranted.      11/06/2016 Imaging    CT C/A/P: IMPRESSION: 1. Stable pulmonary metastatic disease. 2. Similar appearing pancreatic head mass. Progressed atrophy of the pancreatic body and tail. Stable peripancreatic lymph node. 3. Stable hypoattenuating lesion partially exophytic off the inferior pole of the left kidney. 4. Aortic atherosclerosis. 5. Persistent mild wall thickening and mild dilatation of the appendix.       01/30/2017 Imaging    Ct C/A/P: IMPRESSION: 1. Stable pulmonary metastatic disease. 2. Stable to slight increase in size of pancreatic head mass. 3.  Aortic Atherosclerosis (ICD10-I70.0). 4. Similar appearance of mild wall thickening of the appendix which contains an appendicolith.      05/01/2017 Imaging    CT C/A/P: Stable disease in the lungs, no progression of disease in the abdomen.        CANCER STAGING: Cancer Staging Rectal cancer ([place]) Staging form: Colon and Rectum, AJCC 7th Edition - Clinical: Stage IIIB (T3, N1, M0) - Signed by [name], MD on 02/04/2011 - Pathologic stage from 11/17/2014: Stage IVA (M1a) - Signed by Baird Cancer, PA-C on 09/14/2015    INTERVAL HISTORY:  Mr. Pember 67 y.o. male returns for bevacizumab infusion.  He started taking Xeloda 4 tablets twice daily on 08/07/2017.  He is tolerating it so far very well.  He denies any pains in his extremities.   He had vomited once last week.  He does have occasional nausea but denied any diarrhea.  No infections reported.  REVIEW OF SYSTEMS:  Review of Systems  Constitutional: Positive for chills and fatigue.  Gastrointestinal: Positive for nausea.  Musculoskeletal: Positive for flank pain.  Neurological: Positive for dizziness.  All other systems reviewed and are negative.    PAST MEDICAL/SURGICAL HISTORY:  Past Medical History:  Diagnosis Date  . Chronic anticoagulation   . Colon cancer ([place]) 07/24/2010   rectal ca, inv adenocarcinoma  . Depression 04/01/2011  . Diabetes mellitus without complication ([place])   . DVT (deep venous thrombosis) ([place]) 05/09/2011  . GERD (gastroesophageal reflux disease)   . High output ileostomy ([place]) 05/21/2011  . History of kidney stones   . HTN (hypertension)   . Hx of radiation therapy 09/02/10 to 10/14/10   pelvis  . Lung metastasis ([place])   .  Neuropathy   . Peripheral vascular disease ([place])    dvt's,pe  . Pneumonia    hx x3  . Pulmonary embolism ([place])   . Rectal cancer ([place]) 01/15/2011   S/P radiation and concurrent 5-FU continuous infusion from 09/09/10- 10/10/10.  S/P proctectomy with colorectal anastomosis and diverting loop ileostomy on 11/14/10 at [facility] by Dr. Harlon Ditty. Pathology reveals a pT3b N1 with 3/20 lymph nodes.     Past Surgical History:  Procedure Laterality Date  . COLON SURGERY  11/14/2010   proctectomy with colorectal anastomosis and diverting loop ileostomy (temporary planned)  . COLONOSCOPY  07/2010   proximal rectal apple core mass 10-14cm from anal verge (adenocarcinoma), 2-3cm distal rectal carpet polyp s/p piecemeal snare polypectomy (adenoma)  . COLONOSCOPY  04/21/2012   RMR: Friable,fibrotic appearing colorectal anastomosis producing some luminal narrowing-not felt to be critical. path: focal erosion with slight inflammation and hyperemia. SURVEILLANCE DUE DEC 2015  . COLONOSCOPY N/A 11/24/2013   Dr. Rourk:somewhat  fibrotic/friable anastomotic mucosal-status post biopsy (narrowing not felt to be clinically significant) Single colonic diverticulum. benign polypoid rectal mucosa  . colostomy reversal  april 2013  . ESOPHAGOGASTRODUODENOSCOPY  07/2010   RMR: schatki ring s/p dilation, small hh, SB bx benign  . ESOPHAGOGASTRODUODENOSCOPY N/A 09/04/2015   Procedure: ESOPHAGOGASTRODUODENOSCOPY (EGD);  Surgeon: Daneil Dolin, MD;  Location: [facility] ENDO SU[facility];  Service: Endoscopy;  Laterality: N/A;  730  . EUS  08/2010   Dr. Owens Loffler. uT3N0 circumferential, nearly obstruction rectosigmoid adenocarcinoma, distal edge 12cm from anal verge  . EUS N/A 06/19/2016   Procedure: UPPER ENDOSCOPIC ULTRASOUND (EUS) RADIAL;  Surgeon: Milus Banister, MD;  Location: [facility] ENDOSCOPY;  Service: Endoscopy;  Laterality: N/A;  . HERNIA REPAIR     abd hernia repair  . IVC filter    . ivc filter    . port a cath placement    . PORT-A-CATH REMOVAL  09/24/2011   Procedure: REMOVAL PORT-A-CATH;  Surgeon: Donato Heinz, MD;  Location: [facility] ORS;  Service: General;  Laterality: N/A;  Minor Room  . PORTACATH PLACEMENT Right 07/07/2016   Procedure: INSERTION PORT-A-CATH;  Surgeon: Aviva Signs, MD;  Location: [facility] ORS;  Service: General;  Laterality: Right;  Marland Kitchen VIDEO ASSISTED THORACOSCOPY (VATS)/WEDGE RESECTION Right 11/15/2014   Procedure: VIDEO ASSISTED THORACOSCOPY (VATS)/LUNG RESECTION WITH RIGHT LINGULECTOMY;  Surgeon: [name], MD;  Location: [facility];  Service: Thoracic;  Laterality: Right;  Marland Kitchen VIDEO BRONCHOSCOPY N/A 11/15/2014   Procedure: VIDEO BRONCHOSCOPY;  Surgeon: [name], MD;  Location: [facility] OR;  Service: Thoracic;  Laterality: N/A;     SOCIAL HISTORY:  Social History   Socioeconomic History  . Marital status: Married    Spouse name: Not on file  . Number of children: 2  . Years of education: Not on file  . Highest education level: Not on file  Occupational History  . Occupation: self-employed Psychologist, clinical, currently not working    Fish farm manager: SELF EMPLOYED  Social Needs  . Financial resource strain: Not on file  . Food insecurity:    Worry: Not on file    Inability: Not on file  . Transportation needs:    Medical: Not on file    Non-medical: Not on file  Tobacco Use  . Smoking status: Never Smoker  . Smokeless tobacco: Never Used  Substance and Sexual Activity  . Alcohol use: No  . Drug use: No  . Sexual activity: Yes    Birth control/protection: None  Comment: married  Lifestyle  . Physical activity:    Days per week: Not on file    Minutes per session: Not on file  . Stress: Not on file  Relationships  . Social connections:    Talks on phone: Not on file    Gets together: Not on file    Attends religious service: Not on file    Active member of club or organization: Not on file    Attends meetings of clubs or organizations: Not on file    Relationship status: Not on file  . Intimate partner violence:    Fear of current or ex partner: Not on file    Emotionally abused: Not on file    Physically abused: Not on file    Forced sexual activity: Not on file  Other Topics Concern  . Not on file  Social History Narrative  . Not on file    FAMILY HISTORY:  Family History  Problem Relation Age of Onset  . Cancer Brother        throat  . Cancer Brother        prostate  . Colon cancer Neg Hx   . Liver disease Neg Hx   . Inflammatory bowel disease Neg Hx     CURRENT MEDICATIONS:  Outpatient Encounter Medications as of 08/11/2017  Medication Sig  . acetaminophen (TYLENOL) 500 MG tablet Take 1,000 mg by mouth every 6 (six) hours as needed.  Marland Kitchen amoxicillin (AMOXIL) 500 MG capsule   . clonazePAM (KLONOPIN) 1 MG tablet Take 1 tablet (1 mg total) by mouth 2 (two) times daily as needed for anxiety.  . Diphenhyd-Hydrocort-Nystatin (FIRST-DUKES MOUTHWASH) SUSP Use as directed 5 mLs in the mouth or throat 4 (four) times daily.  . diphenoxylate-atropine (LOMOTIL)  2.5-0.025 MG tablet Take 1 tablet by mouth 4 (four) times daily as needed for diarrhea or loose stools.  Marland Kitchen dronabinol (MARINOL) 2.5 MG capsule Take 1 capsule (2.5 mg total) by mouth 2 (two) times daily before a meal.  . gabapentin (NEURONTIN) 300 MG capsule Take 3 capsules (900 mg total) by mouth 2 (two) times daily.  Marland Kitchen glimepiride (AMARYL) 2 MG tablet Take 2 mg by mouth daily with breakfast.   . HYDROcodone-acetaminophen (NORCO/VICODIN) 5-325 MG tablet Take 1-2 tablets by mouth every 6 (six) hours as needed for moderate pain.  Marland Kitchen leucovorin in dextrose 5 % 250 mL Inject into the vein once.  Marland Kitchen levofloxacin (LEVAQUIN) 500 MG tablet   . lisinopril (PRINIVIL,ZESTRIL) 10 MG tablet   . mirtazapine (REMERON) 7.5 MG tablet Take 7.5 mg by mouth at bedtime.  Marland Kitchen omeprazole (PRILOSEC) 20 MG capsule Take 20 mg by mouth 2 (two) times daily before a meal.   . ondansetron (ZOFRAN) 8 MG tablet Take 1 tablet (8 mg total) by mouth 2 (two) times daily as needed for refractory nausea / vomiting. Start on day 3 after chemotherapy.  . potassium chloride SA (K-DUR,KLOR-CON) 20 MEQ tablet Take 1 tablet (20 mEq total) by mouth 2 (two) times daily.  . predniSONE (DELTASONE) 10 MG tablet   . prochlorperazine (COMPAZINE) 10 MG tablet Take 1 tablet (10 mg total) by mouth every 6 (six) hours as needed (Nausea or vomiting).  . psyllium (REGULOID) 0.52 g capsule Take 0.52 g by mouth daily.  . rivaroxaban (XARELTO) 20 MG TABS tablet Take 1 tablet (20 mg total) by mouth daily with supper.  . tamsulosin (FLOMAX) 0.4 MG CAPS capsule Take 0.4 mg by mouth at bedtime.   Marland Kitchen  traZODone (DESYREL) 100 MG tablet Take 100 mg by mouth at bedtime.  Marland Kitchen zinc oxide 20 % ointment Apply 1 application topically as needed for irritation.   Facility-Administered Encounter Medications as of 08/11/2017  Medication  . sodium chloride 0.9 % injection 10 mL  . sodium chloride flush (NS) 0.9 % injection 10 mL    ALLERGIES:  Allergies  Allergen Reactions    . Oxycodone     Blisters, hallucinations  . Tramadol     Blisters, hallucinations  . Trazodone And Nefazodone Other (See Comments)    hallucinations     PHYSICAL EXAM:  ECOG Performance status: 1  Vitals:   08/11/17 1024  BP: (!) 145/84  Pulse: (!) 109  Resp: 16  Temp: 97.7 F (36.5 C)  SpO2: 100%   Filed Weights   08/11/17 1024  Weight: 206 lb 6.4 oz (93.6 kg)      LABORATORY DATA:  I have reviewed the labs as listed.  CBC    Component Value Date/Time   WBC 6.6 08/11/2017 0952   RBC 4.50 08/11/2017 0952   HGB 13.5 08/11/2017 0952   HGB 11.6 (L) 02/04/2011 1059   HCT 42.7 08/11/2017 0952   HCT 34.7 (L) 02/04/2011 1059   PLT 200 08/11/2017 0952   PLT 282 02/04/2011 1059   MCV 94.9 08/11/2017 0952   MCV 76.0 (L) 02/04/2011 1059   MCH 30.0 08/11/2017 0952   MCHC 31.6 08/11/2017 0952   RDW 15.3 08/11/2017 0952   RDW 18.5 (H) 02/04/2011 1059   LYMPHSABS 1.6 08/11/2017 0952   LYMPHSABS 1.1 02/04/2011 1059   MONOABS 0.4 08/11/2017 0952   MONOABS 0.5 02/04/2011 1059   EOSABS 0.0 08/11/2017 0952   EOSABS 0.1 02/04/2011 1059   BASOSABS 0.0 08/11/2017 0952   BASOSABS 0.1 02/04/2011 1059   CMP Latest Ref Rng & Units 08/11/2017 07/14/2017 06/30/2017  Glucose 65 - 99 mg/dL 325(H) 273(H) 233(H)  BUN 6 - 20 mg/dL _0 Creatinine 0.61 - 1.24 mg/dL 0.85 0.80 0.78  Sodium 135 - 145 mmol/L 140 136 138  Potassium 3.5 - 5.1 mmol/L 4.3 4.8 4.2  Chloride 101 - 111 mmol/L 104 105 107  CO2 22 - 32 mmol/L _1 Calcium 8.9 - 10.3 mg/dL 8.9 8.9 8.6(L)  Total Protein 6.5 - 8.1 g/dL 6.7 6.4(L) 6.0(L)  Total Bilirubin 0.3 - 1.2 mg/dL 1.4(H) 1.2 1.3(H)  Alkaline Phos 38 - 126 U/L 66 93 74  AST 15 - 41 U/L _2 ALT 17 - 63 U/L _3 ASSESSMENT & PLAN:   Rectal cancer 1.  Stage IV rectal cancer with lung and pancreatic metastasis: He was on maintenance 5-FU and bevacizumab.  He started Xeloda first cycle on 08/07/2017 and is taking 4 pills twice a day.   He is on a 2 weeks on 1 week of regimen.  He will continue maintenance bevacizumab once every 3 weeks.  He will proceed with bevacizumab today.  I do not know if foundation 1 testing was done on this patient for various mutations.  I will also plan to do scans after the next cycle.  2.  Neuropathy: He has continues numbness and tingling in the hands and feet.  He is taking gabapentin 900 mg twice daily.  He gets pains if he does not take gabapentin.  3.  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  He  will continue Xarelto 20 mg daily.  4.  He complained of right posterior rib pain on and off for the last 3 weeks.  He also has some pain over the right shoulder since this morning.  We will keep a close eye on it.  He is still working Haematologist.      Orders placed this encounter:  Orders Placed This Encounter  Procedures  . CBC with Differential  . Comprehensive metabolic panel  . CEA  . Magnesium      Derek Jack, MD [place] [phone]

## 2017-08-17 ENCOUNTER — Ambulatory Visit (HOSPITAL_COMMUNITY): Payer: Medicare Other | Admitting: Hematology

## 2017-08-17 ENCOUNTER — Other Ambulatory Visit (HOSPITAL_COMMUNITY): Payer: Medicare Other

## 2017-08-17 ENCOUNTER — Ambulatory Visit (HOSPITAL_COMMUNITY): Payer: Medicare Other

## 2017-08-18 ENCOUNTER — Ambulatory Visit (HOSPITAL_COMMUNITY)
Admission: RE | Admit: 2017-08-18 | Discharge: 2017-08-18 | Disposition: A | Discharge status: Home / Self Care | Payer: Medicare Other | Source: Ambulatory Visit | Attending: Internal Medicine | Admitting: Internal Medicine

## 2017-08-18 DIAGNOSIS — I2510 Atherosclerotic heart disease of native coronary artery without angina pectoris: Secondary | ICD-10-CM | POA: Insufficient documentation

## 2017-08-18 DIAGNOSIS — R918 Other nonspecific abnormal finding of lung field: Secondary | ICD-10-CM | POA: Insufficient documentation

## 2017-08-18 DIAGNOSIS — I700 Atherosclerosis of aorta: Secondary | ICD-10-CM | POA: Insufficient documentation

## 2017-08-18 DIAGNOSIS — Z85048 Personal history of other malignant neoplasm of rectum, rectosigmoid junction, and anus: Secondary | ICD-10-CM | POA: Diagnosis not present

## 2017-08-18 DIAGNOSIS — C20 Malignant neoplasm of rectum: Secondary | ICD-10-CM | POA: Diagnosis not present

## 2017-08-18 DIAGNOSIS — K8689 Other specified diseases of pancreas: Secondary | ICD-10-CM | POA: Diagnosis not present

## 2017-08-18 MED ORDER — IOPAMIDOL (ISOVUE-300) INJECTION 61%
100.0000 mL | Freq: Once | INTRAVENOUS | Status: AC | PRN
  Administered 2017-08-18: 100 mL via INTRAVENOUS

## 2017-08-20 ENCOUNTER — Telehealth (HOSPITAL_COMMUNITY): Payer: Self-pay | Admitting: Emergency Medicine

## 2017-08-20 NOTE — Telephone Encounter (Signed)
Charles [name] called and wanted Charles [name]'s CT scans.  Reviewed with Dr Charles [name].  Scans are stable.  Explained to Charles [name] moving forward that Dr Charles [name] does not like for scan results to be given over the telephone.  He like to discuss them at the next OV.  Explained if the OV was too far out for them to make one a few days after the scan to discuss the results.  She verbalized understanding.

## 2017-08-31 ENCOUNTER — Other Ambulatory Visit (HOSPITAL_COMMUNITY): Payer: Self-pay

## 2017-08-31 ENCOUNTER — Other Ambulatory Visit (HOSPITAL_COMMUNITY): Payer: Self-pay | Admitting: Internal Medicine

## 2017-08-31 MED ORDER — CAPECITABINE 500 MG PO TABS: 2000.0000 mg | ORAL_TABLET | Freq: Two times a day (BID) | ORAL | 0 refills | Status: DC

## 2017-08-31 MED FILL — XELODA 500 MG TABLET: 500 | 14 days supply | Qty: 112 | Fill #0

## 2017-09-01 ENCOUNTER — Other Ambulatory Visit: Payer: Self-pay

## 2017-09-01 ENCOUNTER — Inpatient Hospital Stay (HOSPITAL_COMMUNITY): Payer: Medicare Other

## 2017-09-01 ENCOUNTER — Encounter (HOSPITAL_COMMUNITY): Payer: Self-pay | Admitting: Hematology

## 2017-09-01 ENCOUNTER — Inpatient Hospital Stay (HOSPITAL_BASED_OUTPATIENT_CLINIC_OR_DEPARTMENT_OTHER): Payer: Medicare Other | Admitting: Hematology

## 2017-09-01 VITALS — BP 135/71 | HR 73 | Temp 97.6°F | Resp 18 | Wt 210.8 lb

## 2017-09-01 DIAGNOSIS — Z5112 Encounter for antineoplastic immunotherapy: Secondary | ICD-10-CM | POA: Diagnosis not present

## 2017-09-01 DIAGNOSIS — C20 Malignant neoplasm of rectum: Secondary | ICD-10-CM

## 2017-09-01 DIAGNOSIS — Z923 Personal history of irradiation: Secondary | ICD-10-CM

## 2017-09-01 DIAGNOSIS — C7889 Secondary malignant neoplasm of other digestive organs: Secondary | ICD-10-CM

## 2017-09-01 DIAGNOSIS — G893 Neoplasm related pain (acute) (chronic): Secondary | ICD-10-CM | POA: Diagnosis not present

## 2017-09-01 DIAGNOSIS — C7800 Secondary malignant neoplasm of unspecified lung: Secondary | ICD-10-CM

## 2017-09-01 DIAGNOSIS — K6289 Other specified diseases of anus and rectum: Secondary | ICD-10-CM

## 2017-09-01 DIAGNOSIS — C7801 Secondary malignant neoplasm of right lung: Secondary | ICD-10-CM

## 2017-09-01 DIAGNOSIS — Z79899 Other long term (current) drug therapy: Secondary | ICD-10-CM | POA: Diagnosis not present

## 2017-09-01 LAB — CBC WITH DIFFERENTIAL/PLATELET
Basophils Absolute: 0 10*3/uL (ref 0.0–0.1)
Basophils Relative: 0 %
Eosinophils Absolute: 0.1 10*3/uL (ref 0.0–0.7)
Eosinophils Relative: 3 %
HCT: 38.7 % — ABNORMAL LOW (ref 39.0–52.0)
Hemoglobin: 12.5 g/dL — ABNORMAL LOW (ref 13.0–17.0)
Lymphocytes Relative: 29 %
Lymphs Abs: 1.6 10*3/uL (ref 0.7–4.0)
MCH: 30.5 pg (ref 26.0–34.0)
MCHC: 32.3 g/dL (ref 30.0–36.0)
MCV: 94.4 fL (ref 78.0–100.0)
Monocytes Absolute: 0.4 10*3/uL (ref 0.1–1.0)
Monocytes Relative: 7 %
Neutro Abs: 3.3 10*3/uL (ref 1.7–7.7)
Neutrophils Relative %: 61 %
Platelets: 144 10*3/uL — ABNORMAL LOW (ref 150–400)
RBC: 4.1 MIL/uL — ABNORMAL LOW (ref 4.22–5.81)
RDW: 16.8 % — ABNORMAL HIGH (ref 11.5–15.5)
WBC: 5.4 10*3/uL (ref 4.0–10.5)

## 2017-09-01 LAB — URINALYSIS, DIPSTICK ONLY
Bilirubin Urine: NEGATIVE
Glucose, UA: >=500 mg/dL — AB
Hgb urine dipstick: NEGATIVE
Ketones, ur: NEGATIVE mg/dL
Leukocytes, UA: NEGATIVE
Nitrite: NEGATIVE
Protein, ur: NEGATIVE mg/dL
Specific Gravity, Urine: 1.02 (ref 1.005–1.030)
pH: 5 (ref 5.0–8.0)

## 2017-09-01 LAB — COMPREHENSIVE METABOLIC PANEL
ALT: 24 U/L (ref 17–63)
AST: 24 U/L (ref 15–41)
Albumin: 3.4 g/dL — ABNORMAL LOW (ref 3.5–5.0)
Alkaline Phosphatase: 82 U/L (ref 38–126)
Anion gap: 8 (ref 5–15)
BUN: 10 mg/dL (ref 6–20)
CO2: 22 mmol/L (ref 22–32)
Calcium: 8.8 mg/dL — ABNORMAL LOW (ref 8.9–10.3)
Chloride: 108 mmol/L (ref 101–111)
Creatinine, Ser: 0.76 mg/dL (ref 0.61–1.24)
GFR calc Af Amer: >60 mL/min (ref >60)
GFR calc non Af Amer: >60 mL/min (ref >60)
Glucose, Bld: 269 mg/dL — ABNORMAL HIGH (ref 65–99)
Potassium: 4.3 mmol/L (ref 3.5–5.1)
Sodium: 138 mmol/L (ref 135–145)
Total Bilirubin: 1.1 mg/dL (ref 0.3–1.2)
Total Protein: 6 g/dL — ABNORMAL LOW (ref 6.5–8.1)

## 2017-09-01 LAB — MAGNESIUM: Magnesium: 1.7 mg/dL (ref 1.7–2.4)

## 2017-09-01 MED ORDER — HYDROCODONE-ACETAMINOPHEN 5-325 MG PO TABS: 1.0000 | ORAL_TABLET | Freq: Four times a day (QID) | ORAL | 0 refills | Status: DC | PRN

## 2017-09-01 MED ORDER — HEPARIN SOD (PORK) LOCK FLUSH 100 UNIT/ML IV SOLN
500.0000 [IU] | Freq: Once | INTRAVENOUS | Status: AC | PRN
  Administered 2017-09-01: 500 [IU]

## 2017-09-01 MED ORDER — SODIUM CHLORIDE 0.9% FLUSH
10.0000 mL | INTRAVENOUS | Status: DC | PRN
  Administered 2017-09-01: 10 mL
  Filled 2017-09-01: qty 10

## 2017-09-01 MED ORDER — SODIUM CHLORIDE 0.9 % IV SOLN
700.0000 mg | Freq: Once | INTRAVENOUS | Status: AC
  Administered 2017-09-01: 700 mg via INTRAVENOUS
  Filled 2017-09-01: qty 16

## 2017-09-01 MED ORDER — CAPECITABINE 500 MG PO TABS: 1500.0000 mg | ORAL_TABLET | Freq: Two times a day (BID) | ORAL | 1 refills | Status: DC

## 2017-09-01 MED ORDER — SODIUM CHLORIDE 0.9 % IV SOLN
Freq: Once | INTRAVENOUS | Status: AC
  Administered 2017-09-01: 12:00:00 via INTRAVENOUS

## 2017-09-01 NOTE — Progress Notes (Signed)
1220 Lab and urine results reviewed with and pt seen by Dr. Raliegh Ip and pt approved for Avastin infusion today per MD.Pt continues to take his Xeloda as prescribed without issues                                                                 [name] tolerated Avastin infusion well without complaints or incident. VSS upon discharge. Pt discharged self ambulatory in satisfactory condition accompanied by his iwfe

## 2017-09-01 NOTE — Patient Instructions (Signed)
[facility] Discharge Instructions for Patients Receiving Chemotherapy   Beginning January 23rd 2017 lab work for the [facility] will be done in the  Main lab at [facility] on 1st floor. If you have a lab appointment with the [facility] please come in thru the  Main Entrance and check in at the main information desk   Today you received the following chemotherapy agents Avastin. Follow-up as scheduled. Call clinic for any questions or concerns  To help prevent nausea and vomiting after your treatment, we encourage you to take your nausea medication   If you develop nausea and vomiting, or diarrhea that is not controlled by your medication, call the clinic.  The clinic phone number is [phone]. Office hours are Monday-Friday 8:30am-5:00pm.  BELOW ARE SYMPTOMS THAT SHOULD BE REPORTED IMMEDIATELY:  *FEVER GREATER THAN 101.0 F  *CHILLS WITH OR WITHOUT FEVER  NAUSEA AND VOMITING THAT IS NOT CONTROLLED WITH YOUR NAUSEA MEDICATION  *UNUSUAL SHORTNESS OF BREATH  *UNUSUAL BRUISING OR BLEEDING  TENDERNESS IN MOUTH AND THROAT WITH OR WITHOUT PRESENCE OF ULCERS  *URINARY PROBLEMS  *BOWEL PROBLEMS  UNUSUAL RASH Items with * indicate a potential emergency and should be followed up as soon as possible. If you have an emergency after office hours please contact your primary care physician or go to the nearest emergency department.  Please call the clinic during office hours if you have any questions or concerns.   You may also contact the Patient Navigator at [phone] should you have any questions or need assistance in obtaining follow up care.      Resources For Cancer Patients and their Caregivers ? American Cancer Society: Can assist with transportation, wigs, general needs, runs Look Good Feel Better.        (813)185-0147 ? Cancer Care: Provides financial assistance, online support groups, medication/co-pay assistance.  1-800-813-HOPE  802-266-2307) ? [place] Assists [place] cancer patients and their families through emotional , educational and financial support.  430-779-1298 ? [place] DSS Where to apply for food stamps, Medicaid and utility assistance. 479-820-2349 ? [facility]: Transportation to medical appointments. (269)811-7004 ? Social Security Administration: May apply for disability if have a Stage IV cancer. (989)716-2758 714 754 1020 ? [employer], Disability and Transit Services: Assists with nutrition, care and transit needs. ([phone]

## 2017-09-01 NOTE — Assessment & Plan Note (Addendum)
1.  Stage IV rectal cancer with lung and pancreatic metastasis: He was on maintenance 5-FU and bevacizumab.  He started Xeloda first cycle on 08/07/2017 and took 4 pills twice a day.  He is on a 2 weeks on 1 week of regimen.  He felt very tired after starting Xeloda.  He also had flulike symptoms.  His appetite has gone down.  In the last 1 week his appetite improved while he was off of Xeloda.  Hence I have recommended decreasing the dose of Xeloda to 1500 mg twice a day, 2 weeks on 1 week off.  He may proceed with bevacizumab today.  We have reviewed his most recent CT scan results which did not show any progression.  2.  Neuropathy: He has continues numbness and tingling in the hands and feet.  He is taking gabapentin 900 mg twice daily.  He gets pains if he does not take gabapentin.  3.  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  He will continue Xarelto 20 mg daily.  4. Rectal pain: He takes hydrocodone 5 mg tablet as needed for rectal pain.  He does not need it often.  We have refilled his prescription today.

## 2017-09-01 NOTE — Addendum Note (Signed)
Addended by: Derek Jack on: 09/01/2017 03:42 PM   Modules accepted: Orders

## 2017-09-01 NOTE — Progress Notes (Signed)
[name], [place] [id number]   CLINIC:  Medical Oncology/Hematology  PCP:  [name], MD [place] [id number] [phone]   REASON FOR VISIT:  Follow-up for metastatic rectal cancer.  CURRENT THERAPY: Xeloda and Avastin.  BRIEF ONCOLOGIC HISTORY:    Rectal cancer ([place])   07/24/2010 Initial Diagnosis    Invasive adenocarcinoma of rectum      09/09/2010 Concurrent Chemotherapy    S/P radiation and concurrent 5-FU continuous infusion from 09/09/10- 10/10/10.      11/14/2010 Surgery    S/P proctectomy with colorectal anastomosis and diverting loop ileostomy on 11/14/10 at [facility] by Dr. Harlon Ditty. Pathology reveals a pT3b N1 with 3/20 lymph nodes.      02/05/2011 - 07/14/2011 Chemotherapy    FOLFOX      08/18/2011 Surgery    Approximate date of surgery- [place] by Dr. Harlon Ditty       Remission         11/24/2013 Survivorship    Colonoscopy- somewhat fibrotic/friable anastomotic mucosal-status post biopsy (narrowing not felt to be clinically significant).  Negative pathology for malignancy      09/12/2014 Imaging    DVT in the left femoral venous system, left common iliac vein, IVC, and within the IVC filter Right lower extremity venography confirms chronic occlusion of the femoral venous system with collateralization. The right iliac venous system is patent and do      09/25/2014 PET scan    The right middle lobe pulmonary nodule is hypermetabolic, favored to represent a primary bronchogenic carcinoma.Equivocal mediastinal nodes, similar to surrounding blood pool. Bilateral adrenal hypermetabolism, felt to be physiologic      10/24/2014 Pathology Results    Lung, needle/core biopsy(ies), RML - ADENOCARCINOMA, SEE COMMENT metastatic adenocarcinoma of a colorectal primary      10/24/2014 Relapse/Recurrence         11/16/2014 Definitive Surgery    Bronchoscopy, right video-assisted thoracoscopy, wedge resection of  right middle lobe by Dr. Servando Snare      11/16/2014 Pathology Results    Lung, wedge biopsy/resection, right lingula and small portion of middle lobe - METASTATIC ADENOCARCINOMA, CONSISTENT WITH COLORECTAL PRIMARY, SPANNING 2.0 CM. - THE SURGICAL RESECTION MARGINS ARE NEGATIVE FOR ADENOCARCINOMA.      11/16/2014 Remission    THE SURGICAL RESECTION MARGINS ARE NEGATIVE FOR ADENOCARCINOMA.      03/07/2015 Imaging    CT CAP- Interval resection of right middle lobe metastasis. No acute process or evidence of metastatic disease in the chest, abdomen or pelvis. Improved right upper lobe reticular nodular opacities are favored to represent resolving infection.      09/14/2015 Imaging    CT CAP- No findings of recurrent malignancy. No recurrence along the wedge resection site of the right middle lobe. Right anterior abdominal wall focal hernia containing a knuckle of small bowel without complicating feature.      06/13/2016 Imaging    CT CAP- 1. New right-sided pleural metastasis/mass. Other smaller right-sided pulmonary nodules which are all pleural-based and most likely represent pleural metastasis. 2. No convincing evidence of abdominopelvic nodal metastasis. 3. Constellation of findings, including pancreatic atrophy, duct dilatation, and pancreatic head soft tissue fullness which are highly suspicious for pancreatic adenocarcinoma. Metastatic disease felt much less likely. Consider endoscopic ultrasound sampling or ERCP. Cannot exclude superimposed acute pancreatitis. 4. New enlargement of the appendiceal tip with subtle surrounding edema. Cannot exclude early or mild appendicitis. 5. Coronary artery  atherosclerosis. Aortic atherosclerosis. 6. 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      06/13/2016 Progression    CT scan demonstrates progression of disease      06/19/2016 Procedure    EUS with FNA by Dr. Ardis Hughs      06/20/2016 Pathology Results    FINE NEEDLE  ASPIRATION, ENDOSCOPIC, PANCREAS UNCINATE AREA(SPECIMEN 1 OF 1 COLLECTED 06/19/16): MALIGNANT CELLS CONSISTENT WITH METASTATIC ADENOCARCINOMA.      06/25/2016 PET scan    1. Two pleural-based nodules in the right hemithorax are hypermetabolic. Metastatic disease is a distinct consideration. The scattered pulmonary parenchymal nodule seen on previous diagnostic CT imaging are below the threshold for reliable resolution on PET imaging. 2. Hypermetabolic lesion pancreatic head, consistent with neoplasm. As noted on prior CT, adenocarcinoma is any consideration. No evidence for hypermetabolic abdominal lymphadenopathy. Mottled uptake noted in the liver, but no discrete hepatic metastases are evident on PET imaging. 3. Appendix remains distended up to 0.9-10 mm diameter with a stone towards the tip in subtle periappendiceal edema/inflammation. The appendix is hypermetabolic along its length. Imaging features are relatively stable in the 12 day interval since prior CT scan. While appendicitis is a consideration, the relative stability over 12 days would be unusual for that etiology. Continued close follow-up recommended.      06/26/2016 Pathology Results    Not enough tissue for foundationONE or K-ras testing.      07/07/2016 Procedure    Port placed by Dr. Arnoldo Morale      07/08/2016 -  Chemotherapy    The patient had pegfilgrastim (NEULASTA) injection 6 mg, 6 mg, Subcutaneous,  Once, 0 of 4 cycles  ondansetron (ZOFRAN) IVPB 8 mg, 8 mg, Intravenous,  Once, 0 of 4 cycles  leucovorin 804 mg in dextrose 5 % 250 mL infusion, 400 mg/m2, Intravenous,  Once, 0 of 4 cycles  oxaliplatin (ELOXATIN) 170 mg in dextrose 5 % 500 mL chemo infusion, 85 mg/m2, Intravenous,  Once, 0 of 4 cycles  fluorouracil (ADRUCIL) 4,800 mg in sodium chloride 0.9 % 150 mL chemo infusion, 2,400 mg/m2 = 4,800 mg, Intravenous, 1 Day/Dose, 0 of 4 cycles  fluorouracil (ADRUCIL) chemo injection 800 mg, 400 mg/m2, Intravenous,   Once, 0 of 4 cycles  pegfilgrastim (NEULASTA) injection 6 mg, 6 mg, Subcutaneous,  Once, 2 of 2 cycles  ondansetron (ZOFRAN) IVPB 8 mg, 8 mg, Intravenous,  Once, 12 of 12 cycles  leucovorin 660 mg in dextrose 5 % 250 mL infusion, 660 mg (original dose ), Intravenous,  Once, 12 of 12 cycles Dose modification: 660 mg (Cycle 1)  oxaliplatin (ELOXATIN) 140 mg in dextrose 5 % 500 mL chemo infusion, 140 mg (original dose ), Intravenous,  Once, 12 of 12 cycles Dose modification: 140 mg (Cycle 1), 119 mg (85 % of original dose 140 mg, Cycle 8, Reason: Provider Judgment)  fluorouracil (ADRUCIL) 3,850 mg in sodium chloride 0.9 % 150 mL chemo infusion, 3,850 mg (original dose ), Intravenous, 1 Day/Dose, 12 of 12 cycles Dose modification: 3,850 mg (Cycle 1)  fluorouracil (ADRUCIL) chemo injection 700 mg, 700 mg (original dose ), Intravenous,  Once, 12 of 12 cycles Dose modification: 700 mg (Cycle 1)  palonosetron (ALOXI) injection 0.25 mg, 0.25 mg, Intravenous,  Once, 7 of 12 cycles Administration: 0.25 mg (09/30/2016)  bevacizumab (AVASTIN) 475 mg in sodium chloride 0.9 % 100 mL chemo infusion, 5 mg/kg = 475 mg, Intravenous,  Once, 6 of 11 cycles Administration: 500 mg (09/30/2016)  leucovorin 880  mg in dextrose 5 % 250 mL infusion, 400 mg/m2 = 880 mg, Intravenous,  Once, 7 of 12 cycles Administration: 880 mg (09/30/2016)  oxaliplatin (ELOXATIN) 185 mg in dextrose 5 % 500 mL chemo infusion, 85 mg/m2 = 185 mg, Intravenous,  Once, 7 of 12 cycles Administration: 185 mg (09/30/2016)  fluorouracil (ADRUCIL) chemo injection 900 mg, 400 mg/m2 = 900 mg, Intravenous,  Once, 7 of 12 cycles Administration: 900 mg (09/30/2016)  fluorouracil (ADRUCIL) 5,300 mg in sodium chloride 0.9 % 144 mL chemo infusion, 2,400 mg/m2 = 5,300 mg, Intravenous, 1 Day/Dose, 7 of 12 cycles Administration: 5,300 mg (09/30/2016)  for chemotherapy treatment.        08/27/2016 Imaging    CT CAP- 1. Pulmonary metastatic disease is  stable. 2. Pancreatic head mass, grossly stable, with progressive atrophy of the body and tail of the pancreas. Stable peripancreatic lymph node. 3. Mild circumferential rectal wall thickening, stable. 4. Aortic atherosclerosis (ICD10-170.0). Coronary artery calcification. 5. Hyperattenuating lesion off the lower pole left kidney, stable, too small to characterize. Continued attention on followup exams is warranted. 6. Persistent wall thickening and mild dilatation of the proximal appendix, of uncertain etiology. Continued attention on followup exams is warranted.      11/06/2016 Imaging    CT C/A/P: IMPRESSION: 1. Stable pulmonary metastatic disease. 2. Similar appearing pancreatic head mass. Progressed atrophy of the pancreatic body and tail. Stable peripancreatic lymph node. 3. Stable hypoattenuating lesion partially exophytic off the inferior pole of the left kidney. 4. Aortic atherosclerosis. 5. Persistent mild wall thickening and mild dilatation of the appendix.       01/30/2017 Imaging    Ct C/A/P: IMPRESSION: 1. Stable pulmonary metastatic disease. 2. Stable to slight increase in size of pancreatic head mass. 3.  Aortic Atherosclerosis (ICD10-I70.0). 4. Similar appearance of mild wall thickening of the appendix which contains an appendicolith.      05/01/2017 Imaging    CT C/A/P: Stable disease in the lungs, no progression of disease in the abdomen.        CANCER STAGING: Cancer Staging Rectal cancer ([place]) Staging form: Colon and Rectum, AJCC 7th Edition - Clinical: Stage IIIB (T3, N1, M0) - Signed by [name], MD on 02/04/2011 - Pathologic stage from 11/17/2014: Stage IVA (M1a) - Signed by Baird Cancer, PA-C on 09/14/2015    INTERVAL HISTORY:  Mr. Ohlin 67 y.o. male returns for bevacizumab infusion.    Here today with his wife.   Denies any frank bleeding episodes.   Remains on Xeloda, taking 4 tabs (2000 mg) BID, 2 weeks on, 1 week  off.  "It made me feel like I have the flu. I saw my family doctor and he sent me to get an X-ray and checked me for the flu."   Endorses weakness and fatigue; "seems like all of my energy is gone. I was able to do what I needed to do, but I just felt so tired."  Denies any skin peeling, redness, or pain.  Peripheral neuropathy is stable. Denies vomiting or diarrhea. Endorses nausea & "dry heaves."    Denies mucositis.  Appetite is not good "when I was taking those 4 pills. But it's better when I'm on the off week."    Scheduled to resume Xeloda today for next cycle.  His wife tells [place] that they have not received his next bottle.   Had CT chest/abd/pelvis on 08/18/17. Overall, findings are stable. No new findings for metastatic lesions.   Recommended dose-reduction  of Xeloda to 3 tabs BID (1500 mg BID).   He has occasional rectal pain; requesting refill of Norco today.      REVIEW OF SYSTEMS:  Review of Systems  Constitutional: Positive for appetite change and fatigue.  Musculoskeletal: Positive for myalgias.  All other systems reviewed and are negative.    PAST MEDICAL/SURGICAL HISTORY:  Past Medical History:  Diagnosis Date  . Chronic anticoagulation   . Colon cancer ([place]) 07/24/2010   rectal ca, inv adenocarcinoma  . Depression 04/01/2011  . Diabetes mellitus without complication ([place])   . DVT (deep venous thrombosis) ([place]) 05/09/2011  . GERD (gastroesophageal reflux disease)   . High output ileostomy ([place]) 05/21/2011  . History of kidney stones   . HTN (hypertension)   . Hx of radiation therapy 09/02/10 to 10/14/10   pelvis  . Lung metastasis ([place])   . Neuropathy   . Peripheral vascular disease ([place])    dvt's,pe  . Pneumonia    hx x3  . Pulmonary embolism ([place])   . Rectal cancer ([place]) 01/15/2011   S/P radiation and concurrent 5-FU continuous infusion from 09/09/10- 10/10/10.  S/P proctectomy with colorectal anastomosis and diverting loop ileostomy on 11/14/10 at [facility] by Dr.  Harlon Ditty. Pathology reveals a pT3b N1 with 3/20 lymph nodes.     Past Surgical History:  Procedure Laterality Date  . COLON SURGERY  11/14/2010   proctectomy with colorectal anastomosis and diverting loop ileostomy (temporary planned)  . COLONOSCOPY  07/2010   proximal rectal apple core mass 10-14cm from anal verge (adenocarcinoma), 2-3cm distal rectal carpet polyp s/p piecemeal snare polypectomy (adenoma)  . COLONOSCOPY  04/21/2012   RMR: Friable,fibrotic appearing colorectal anastomosis producing some luminal narrowing-not felt to be critical. path: focal erosion with slight inflammation and hyperemia. SURVEILLANCE DUE DEC 2015  . COLONOSCOPY N/A 11/24/2013   Dr. Rourk:somewhat fibrotic/friable anastomotic mucosal-status post biopsy (narrowing not felt to be clinically significant) Single colonic diverticulum. benign polypoid rectal mucosa  . colostomy reversal  april 2013  . ESOPHAGOGASTRODUODENOSCOPY  07/2010   RMR: schatki ring s/p dilation, small hh, SB bx benign  . ESOPHAGOGASTRODUODENOSCOPY N/A 09/04/2015   Procedure: ESOPHAGOGASTRODUODENOSCOPY (EGD);  Surgeon: Daneil Dolin, MD;  Location: [facility];  Service: Endoscopy;  Laterality: N/A;  730  . EUS  08/2010   Dr. Owens Loffler. uT3N0 circumferential, nearly obstruction rectosigmoid adenocarcinoma, distal edge 12cm from anal verge  . EUS N/A 06/19/2016   Procedure: UPPER ENDOSCOPIC ULTRASOUND (EUS) RADIAL;  Surgeon: Milus Banister, MD;  Location: [facility] END[facility];  Service: Endoscopy;  Laterality: N/A;  . HERNIA REPAIR     abd hernia repair  . IVC filter    . ivc filter    . port a cath placement    . PORT-A-CATH REMOVAL  09/24/2011   Procedure: REMOVAL PORT-A-CATH;  Surgeon: Donato Heinz, MD;  Location: [facility] ORS;  Service: General;  Laterality: N/A;  Minor Room  . PORTACATH PLACEMENT Right 07/07/2016   Procedure: INSERTION PORT-A-CATH;  Surgeon: Aviva Signs, MD;  Location: [facility] ORS;  Service: General;  Laterality: Right;  Marland Kitchen VIDEO  ASSISTED THORACOSCOPY (VATS)/WEDGE RESECTION Right 11/15/2014   Procedure: VIDEO ASSISTED THORACOSCOPY (VATS)/LUNG RESECTION WITH RIGHT LINGULECTOMY;  Surgeon: [name], MD;  Location: [facility];  Service: Thoracic;  Laterality: Right;  Marland Kitchen VIDEO BRONCHOSCOPY N/A 11/15/2014   Procedure: VIDEO BRONCHOSCOPY;  Surgeon: [name], MD;  Location: [place];  Service: Thoracic;  Laterality: N/A;     SOCIAL HISTORY:  Social History   Socioeconomic History  . Marital status: Married    Spouse name: Not on file  . Number of children: 2  . Years of education: Not on file  . Highest education level: Not on file  Occupational History  . Occupation: self-employed Regulatory affairs officer, currently not working    Fish farm manager: SELF EMPLOYED  Social Needs  . Financial resource strain: Not on file  . Food insecurity:    Worry: Not on file    Inability: Not on file  . Transportation needs:    Medical: Not on file    Non-medical: Not on file  Tobacco Use  . Smoking status: Never Smoker  . Smokeless tobacco: Never Used  Substance and Sexual Activity  . Alcohol use: No  . Drug use: No  . Sexual activity: Yes    Birth control/protection: None    Comment: married  Lifestyle  . Physical activity:    Days per week: Not on file    Minutes per session: Not on file  . Stress: Not on file  Relationships  . Social connections:    Talks on phone: Not on file    Gets together: Not on file    Attends religious service: Not on file    Active member of club or organization: Not on file    Attends meetings of clubs or organizations: Not on file    Relationship status: Not on file  . Intimate partner violence:    Fear of current or ex partner: Not on file    Emotionally abused: Not on file    Physically abused: Not on file    Forced sexual activity: Not on file  Other Topics Concern  . Not on file  Social History Narrative  . Not on file    FAMILY HISTORY:  Family History  Problem  Relation Age of Onset  . Cancer Brother        throat  . Cancer Brother        prostate  . Colon cancer Neg Hx   . Liver disease Neg Hx   . Inflammatory bowel disease Neg Hx     CURRENT MEDICATIONS:  Outpatient Encounter Medications as of 09/01/2017  Medication Sig  . acetaminophen (TYLENOL) 500 MG tablet Take 1,000 mg by mouth every 6 (six) hours as needed.  Marland Kitchen amoxicillin (AMOXIL) 500 MG capsule   . capecitabine (XELODA) 500 MG tablet Take 3 tablets (1,500 mg total) by mouth 2 (two) times daily after a meal.  . clonazePAM (KLONOPIN) 1 MG tablet Take 1 tablet (1 mg total) by mouth 2 (two) times daily as needed for anxiety.  . Diphenhyd-Hydrocort-Nystatin (FIRST-DUKES MOUTHWASH) SUSP Use as directed 5 mLs in the mouth or throat 4 (four) times daily.  . diphenoxylate-atropine (LOMOTIL) 2.5-0.025 MG tablet Take 1 tablet by mouth 4 (four) times daily as needed for diarrhea or loose stools.  Marland Kitchen dronabinol (MARINOL) 2.5 MG capsule Take 1 capsule (2.5 mg total) by mouth 2 (two) times daily before a meal.  . gabapentin (NEURONTIN) 300 MG capsule Take 3 capsules (900 mg total) by mouth 2 (two) times daily.  Marland Kitchen glimepiride (AMARYL) 2 MG tablet Take 2 mg by mouth daily with breakfast.   . HYDROcodone-acetaminophen (NORCO/VICODIN) 5-325 MG tablet Take 1-2 tablets by mouth every 6 (six) hours as needed for moderate pain.  Marland Kitchen leucovorin in dextrose 5 % 250 mL Inject into the vein once.  Marland Kitchen levofloxacin (LEVAQUIN) 500 MG tablet   . lisinopril (PRINIVIL,ZESTRIL)  10 MG tablet   . mirtazapine (REMERON) 7.5 MG tablet Take 7.5 mg by mouth at bedtime.  Marland Kitchen omeprazole (PRILOSEC) 20 MG capsule Take 20 mg by mouth 2 (two) times daily before a meal.   . ondansetron (ZOFRAN) 8 MG tablet Take 1 tablet (8 mg total) by mouth 2 (two) times daily as needed for refractory nausea / vomiting. Start on day 3 after chemotherapy.  . potassium chloride SA (K-DUR,KLOR-CON) 20 MEQ tablet Take 1 tablet (20 mEq total) by mouth 2 (two)  times daily.  . predniSONE (DELTASONE) 10 MG tablet   . prochlorperazine (COMPAZINE) 10 MG tablet Take 1 tablet (10 mg total) by mouth every 6 (six) hours as needed (Nausea or vomiting).  . psyllium (REGULOID) 0.52 g capsule Take 0.52 g by mouth daily.  . rivaroxaban (XARELTO) 20 MG TABS tablet Take 1 tablet (20 mg total) by mouth daily with supper.  . tamsulosin (FLOMAX) 0.4 MG CAPS capsule Take 0.4 mg by mouth at bedtime.   . traZODone (DESYREL) 100 MG tablet Take 100 mg by mouth at bedtime.  Marland Kitchen zinc oxide 20 % ointment Apply 1 application topically as needed for irritation.  . [DISCONTINUED] capecitabine (XELODA) 500 MG tablet Take 4 tablets (2,000 mg total) by mouth 2 (two) times daily after a meal.   Facility-Administered Encounter Medications as of 09/01/2017  Medication  . sodium chloride 0.9 % injection 10 mL  . sodium chloride flush (NS) 0.9 % injection 10 mL    ALLERGIES:  Allergies  Allergen Reactions  . Oxycodone     Blisters, hallucinations  . Tramadol     Blisters, hallucinations  . Trazodone And Nefazodone Other (See Comments)    hallucinations     PHYSICAL EXAM:  ECOG Performance status: 1 I have reviewed his vital signs. Physical examination of the extremities did not show any hand-foot skin reaction.   LABORATORY DATA:  I have reviewed the labs as listed.  CBC    Component Value Date/Time   WBC 5.4 09/01/2017 1010   RBC 4.10 (L) 09/01/2017 1010   HGB 12.5 (L) 09/01/2017 1010   HGB 11.6 (L) 02/04/2011 1059   HCT 38.7 (L) 09/01/2017 1010   HCT 34.7 (L) 02/04/2011 1059   PLT 144 (L) 09/01/2017 1010   PLT 282 02/04/2011 1059   MCV 94.4 09/01/2017 1010   MCV 76.0 (L) 02/04/2011 1059   MCH 30.5 09/01/2017 1010   MCHC 32.3 09/01/2017 1010   RDW 16.8 (H) 09/01/2017 1010   RDW 18.5 (H) 02/04/2011 1059   LYMPHSABS 1.6 09/01/2017 1010   LYMPHSABS 1.1 02/04/2011 1059   MONOABS 0.4 09/01/2017 1010   MONOABS 0.5 02/04/2011 1059   EOSABS 0.1 09/01/2017 1010     EOSABS 0.1 02/04/2011 1059   BASOSABS 0.0 09/01/2017 1010   BASOSABS 0.1 02/04/2011 1059   CMP Latest Ref Rng & Units 09/01/2017 08/11/2017 07/14/2017  Glucose 65 - 99 mg/dL 269(H) 325(H) 273(H)  BUN 6 - 20 mg/dL _0 Creatinine 0.61 - 1.24 mg/dL 0.76 0.85 0.80  Sodium 135 - 145 mmol/L 138 140 136  Potassium 3.5 - 5.1 mmol/L 4.3 4.3 4.8  Chloride 101 - 111 mmol/L 108 104 105  CO2 22 - 32 mmol/L _1 Calcium 8.9 - 10.3 mg/dL 8.8(L) 8.9 8.9  Total Protein 6.5 - 8.1 g/dL 6.0(L) 6.7 6.4(L)  Total Bilirubin 0.3 - 1.2 mg/dL 1.1 1.4(H) 1.2  Alkaline Phos 38 - 126 U/L 82 66 93  AST  15 - 41 U/L _0 ALT 17 - 63 U/L _1 ASSESSMENT & PLAN:   Rectal cancer 1.  Stage IV rectal cancer with lung and pancreatic metastasis: He was on maintenance 5-FU and bevacizumab.  He started Xeloda first cycle on 08/07/2017 and took 4 pills twice a day.  He is on a 2 weeks on 1 week of regimen.  He felt very tired after starting Xeloda.  He also had flulike symptoms.  His appetite has gone down.  In the last 1 week his appetite improved while he was off of Xeloda.  Hence I have recommended decreasing the dose of Xeloda to 1500 mg twice a day, 2 weeks on 1 week off.  He may proceed with bevacizumab today.  We have reviewed his most recent CT scan results which did not show any progression.  2.  Neuropathy: He has continues numbness and tingling in the hands and feet.  He is taking gabapentin 900 mg twice daily.  He gets pains if he does not take gabapentin.  3.  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  He will continue Xarelto 20 mg daily.  4. Rectal pain: He takes hydrocodone 5 mg tablet as needed for rectal pain.  He does not need it often.  We have refilled his prescription today.      Orders placed this encounter:  No orders of the defined types were placed in this encounter.     Derek Jack, MD [place] [phone]

## 2017-09-01 NOTE — Patient Instructions (Signed)
[facility] at [facility] Discharge Instructions  Today you saw Dr. K.   Thank you for choosing [facility] at [facility] to provide your oncology and hematology care.  To afford each patient quality time with our provider, please arrive at least 15 minutes before your scheduled appointment time.   If you have a lab appointment with the Cancer Center please come in thru the  Main Entrance and check in at the main information desk  You need to re-schedule your appointment should you arrive 10 or more minutes late.  We strive to give you quality time with our providers, and arriving late affects you and other patients whose appointments are after yours.  Also, if you no show three or more times for appointments you may be dismissed from the clinic at the providers discretion.     Again, thank you for choosing [facility].  Our hope is that these requests will decrease the amount of time that you wait before being seen by our physicians.       _____________________________________________________________  Should you have questions after your visit to [facility], please contact our office at [phone] between the hours of 8:30 a.m. and 4:30 p.m.  Voicemails left after 4:30 p.m. will not be returned until the following business day.  For prescription refill requests, have your pharmacy contact our office.       Resources For Cancer Patients and their Caregivers ? [employer]: Can assist with transportation, wigs, general needs, runs Look Good Feel Better.        1-888-227-6333 ? Cancer Care: Provides financial assistance, online support groups, medication/co-pay assistance.  1-800-813-HOPE (4673) ? [facility] Assists [place] cancer patients and their families through emotional , educational and financial support.  336-427-4357 ? [place] DSS Where to apply for food  stamps, Medicaid and utility assistance. 336-342-1394 ? [facility]: Transportation to medical appointments. 336-347-2287 ? Social Security Administration: May apply for disability if have a Stage IV cancer. 336-342-7796 1-800-772-1213 ? [place] [facility], Dis[facility] and Transit [facility]: Assists with nutrition, care and transit needs. 336-349-2343  Cancer Center Support Programs:   > Cancer Support Group  2nd Tuesday of the month 1pm-2pm, Journey Room   > Creative Journey  3rd Tuesday of the month 1130am-1pm, Journey Room    

## 2017-09-02 LAB — CEA: CEA: 2.8 ng/mL (ref 0.0–4.7)

## 2017-09-07 ENCOUNTER — Other Ambulatory Visit (HOSPITAL_COMMUNITY): Payer: Self-pay | Admitting: *Deleted

## 2017-09-07 DIAGNOSIS — C7800 Secondary malignant neoplasm of unspecified lung: Secondary | ICD-10-CM

## 2017-09-07 DIAGNOSIS — C20 Malignant neoplasm of rectum: Secondary | ICD-10-CM

## 2017-09-07 MED ORDER — CAPECITABINE 500 MG PO TABS: 1500.0000 mg | ORAL_TABLET | Freq: Two times a day (BID) | ORAL | 1 refills | Status: DC

## 2017-09-07 MED FILL — XELODA 500 MG TABLET: 500 | 14 days supply | Qty: 84 | Fill #0

## 2017-09-18 ENCOUNTER — Other Ambulatory Visit (HOSPITAL_COMMUNITY): Payer: Self-pay

## 2017-09-18 DIAGNOSIS — C187 Malignant neoplasm of sigmoid colon: Secondary | ICD-10-CM

## 2017-09-22 ENCOUNTER — Inpatient Hospital Stay (HOSPITAL_COMMUNITY): Payer: Medicare Other

## 2017-09-22 ENCOUNTER — Other Ambulatory Visit (HOSPITAL_COMMUNITY): Payer: Medicare Other

## 2017-09-22 ENCOUNTER — Ambulatory Visit (HOSPITAL_COMMUNITY): Payer: Medicare Other

## 2017-09-22 ENCOUNTER — Encounter (HOSPITAL_COMMUNITY): Payer: Self-pay

## 2017-09-22 ENCOUNTER — Inpatient Hospital Stay (HOSPITAL_BASED_OUTPATIENT_CLINIC_OR_DEPARTMENT_OTHER): Payer: Medicare Other | Admitting: Adult Health

## 2017-09-22 ENCOUNTER — Inpatient Hospital Stay (HOSPITAL_COMMUNITY): Payer: Medicare Other | Attending: Hematology

## 2017-09-22 ENCOUNTER — Encounter (HOSPITAL_COMMUNITY): Payer: Self-pay | Admitting: Adult Health

## 2017-09-22 ENCOUNTER — Ambulatory Visit (HOSPITAL_COMMUNITY): Payer: Medicare Other | Admitting: Adult Health

## 2017-09-22 VITALS — BP 138/71 | HR 71 | Temp 97.6°F | Resp 18

## 2017-09-22 VITALS — BP 151/76 | HR 79 | Temp 97.5°F | Resp 18 | Wt 210.8 lb

## 2017-09-22 DIAGNOSIS — G893 Neoplasm related pain (acute) (chronic): Secondary | ICD-10-CM

## 2017-09-22 DIAGNOSIS — C7801 Secondary malignant neoplasm of right lung: Secondary | ICD-10-CM | POA: Insufficient documentation

## 2017-09-22 DIAGNOSIS — Z5112 Encounter for antineoplastic immunotherapy: Secondary | ICD-10-CM

## 2017-09-22 DIAGNOSIS — Z923 Personal history of irradiation: Secondary | ICD-10-CM

## 2017-09-22 DIAGNOSIS — C7800 Secondary malignant neoplasm of unspecified lung: Secondary | ICD-10-CM

## 2017-09-22 DIAGNOSIS — C7889 Secondary malignant neoplasm of other digestive organs: Secondary | ICD-10-CM

## 2017-09-22 DIAGNOSIS — Z5111 Encounter for antineoplastic chemotherapy: Secondary | ICD-10-CM | POA: Diagnosis not present

## 2017-09-22 DIAGNOSIS — C20 Malignant neoplasm of rectum: Secondary | ICD-10-CM

## 2017-09-22 DIAGNOSIS — Z79899 Other long term (current) drug therapy: Secondary | ICD-10-CM

## 2017-09-22 DIAGNOSIS — C187 Malignant neoplasm of sigmoid colon: Secondary | ICD-10-CM

## 2017-09-22 LAB — CBC WITH DIFFERENTIAL/PLATELET
Basophils Absolute: 0 10*3/uL (ref 0.0–0.1)
Basophils Relative: 0 %
Eosinophils Absolute: 0.3 10*3/uL (ref 0.0–0.7)
Eosinophils Relative: 4 %
HCT: 38.7 % — ABNORMAL LOW (ref 39.0–52.0)
Hemoglobin: 12.6 g/dL — ABNORMAL LOW (ref 13.0–17.0)
Lymphocytes Relative: 31 %
Lymphs Abs: 1.9 10*3/uL (ref 0.7–4.0)
MCH: 31.4 pg (ref 26.0–34.0)
MCHC: 32.6 g/dL (ref 30.0–36.0)
MCV: 96.5 fL (ref 78.0–100.0)
Monocytes Absolute: 0.5 10*3/uL (ref 0.1–1.0)
Monocytes Relative: 8 %
Neutro Abs: 3.5 10*3/uL (ref 1.7–7.7)
Neutrophils Relative %: 57 %
Platelets: 201 10*3/uL (ref 150–400)
RBC: 4.01 MIL/uL — ABNORMAL LOW (ref 4.22–5.81)
RDW: 17.8 % — ABNORMAL HIGH (ref 11.5–15.5)
WBC: 6.1 10*3/uL (ref 4.0–10.5)

## 2017-09-22 LAB — COMPREHENSIVE METABOLIC PANEL
ALT: 22 U/L (ref 17–63)
AST: 24 U/L (ref 15–41)
Albumin: 3.5 g/dL (ref 3.5–5.0)
Alkaline Phosphatase: 76 U/L (ref 38–126)
Anion gap: 6 (ref 5–15)
BUN: 14 mg/dL (ref 6–20)
CO2: 24 mmol/L (ref 22–32)
Calcium: 8.7 mg/dL — ABNORMAL LOW (ref 8.9–10.3)
Chloride: 108 mmol/L (ref 101–111)
Creatinine, Ser: 0.83 mg/dL (ref 0.61–1.24)
GFR calc Af Amer: >60 mL/min (ref >60)
GFR calc non Af Amer: >60 mL/min (ref >60)
Glucose, Bld: 277 mg/dL — ABNORMAL HIGH (ref 65–99)
Potassium: 4.3 mmol/L (ref 3.5–5.1)
Sodium: 138 mmol/L (ref 135–145)
Total Bilirubin: 0.9 mg/dL (ref 0.3–1.2)
Total Protein: 6.1 g/dL — ABNORMAL LOW (ref 6.5–8.1)

## 2017-09-22 LAB — URINALYSIS, DIPSTICK ONLY
Bilirubin Urine: NEGATIVE
Glucose, UA: >=500 mg/dL — AB
Hgb urine dipstick: NEGATIVE
Ketones, ur: NEGATIVE mg/dL
Leukocytes, UA: NEGATIVE
Nitrite: NEGATIVE
Protein, ur: NEGATIVE mg/dL
Specific Gravity, Urine: 1.024 (ref 1.005–1.030)
pH: 5 (ref 5.0–8.0)

## 2017-09-22 LAB — MAGNESIUM: Magnesium: 1.7 mg/dL (ref 1.7–2.4)

## 2017-09-22 MED ORDER — SODIUM CHLORIDE 0.9 % IV SOLN
Freq: Once | INTRAVENOUS | Status: AC
  Administered 2017-09-22: 500 mL via INTRAVENOUS

## 2017-09-22 MED ORDER — CAPECITABINE 500 MG PO TABS: 1500.0000 mg | ORAL_TABLET | Freq: Two times a day (BID) | ORAL | 1 refills | Status: DC

## 2017-09-22 MED ORDER — SODIUM CHLORIDE 0.9% FLUSH
10.0000 mL | INTRAVENOUS | Status: DC | PRN
  Administered 2017-09-22: 10 mL
  Filled 2017-09-22: qty 10

## 2017-09-22 MED ORDER — HEPARIN SOD (PORK) LOCK FLUSH 100 UNIT/ML IV SOLN
500.0000 [IU] | Freq: Once | INTRAVENOUS | Status: AC | PRN
  Administered 2017-09-22: 500 [IU]

## 2017-09-22 MED ORDER — SODIUM CHLORIDE 0.9 % IV SOLN
700.0000 mg | Freq: Once | INTRAVENOUS | Status: AC
  Administered 2017-09-22: 700 mg via INTRAVENOUS
  Filled 2017-09-22: qty 12

## 2017-09-22 MED FILL — XELODA 500 MG TABLET: 500 | 14 days supply | Qty: 84 | Fill #0

## 2017-09-22 NOTE — Patient Instructions (Signed)
[facility] Discharge Instructions for Patients Receiving Chemotherapy  Today you received the following chemotherapy agents avastin.     If you develop nausea and vomiting that is not controlled by your nausea medication, call the clinic.   BELOW ARE SYMPTOMS THAT SHOULD BE REPORTED IMMEDIATELY:  *FEVER GREATER THAN 100.5 F  *CHILLS WITH OR WITHOUT FEVER  NAUSEA AND VOMITING THAT IS NOT CONTROLLED WITH YOUR NAUSEA MEDICATION  *UNUSUAL SHORTNESS OF BREATH  *UNUSUAL BRUISING OR BLEEDING  TENDERNESS IN MOUTH AND THROAT WITH OR WITHOUT PRESENCE OF ULCERS  *URINARY PROBLEMS  *BOWEL PROBLEMS  UNUSUAL RASH Items with * indicate a potential emergency and should be followed up as soon as possible.  Feel free to call the clinic should you have any questions or concerns. The clinic phone number is [phone].  Please show the [place] at check-in to the Emergency Department and triage nurse.

## 2017-09-22 NOTE — Progress Notes (Signed)
Patient for avastin and oncology follow up visit.  Taking xeloda as directed.  Denied rashes and itching.  Continues with diarrhea with meals.  Stated neuropathy improved, especially in fingers. Family at side.   Patient tolerated Avastin with no complaints voiced.  Port site clean and dry with good blood return noted before and after administration of therapy.  No complaints of pain with flush.  Band aid applied.  VSs with discharge and left ambulatory with family with no s/s of distress noted.

## 2017-09-22 NOTE — Progress Notes (Signed)
[address], [place] [address]   CLINIC:  Medical Oncology/Hematology  PCP:  [name], MD [place] [address] [phone]   REASON FOR VISIT:  Follow-up for Stage IVA adenocarcinoma of rectum with lung and pancreatic mets  CURRENT THERAPY: Avastin every 3 weeks and Xeloda 1500 mg po BID 2 weeks on, 1 week off every 3 weeks  BRIEF ONCOLOGIC HISTORY:    Rectal cancer ([place])   07/24/2010 Initial Diagnosis    Invasive adenocarcinoma of rectum      09/09/2010 Concurrent Chemotherapy    S/P radiation and concurrent 5-FU continuous infusion from 09/09/10- 10/10/10.      11/14/2010 Surgery    S/P proctectomy with colorectal anastomosis and diverting loop ileostomy on 11/14/10 at [facility] by Dr. Harlon Ditty. Pathology reveals a pT3b N1 with 3/20 lymph nodes.      02/05/2011 - 07/14/2011 Chemotherapy    FOLFOX      08/18/2011 Surgery    Approximate date of surgery- [place] by Dr. Harlon Ditty       Remission         11/24/2013 Survivorship    Colonoscopy- somewhat fibrotic/friable anastomotic mucosal-status post biopsy (narrowing not felt to be clinically significant).  Negative pathology for malignancy      09/12/2014 Imaging    DVT in the left femoral venous system, left common iliac vein, IVC, and within the IVC filter Right lower extremity venography confirms chronic occlusion of the femoral venous system with collateralization. The right iliac venous system is patent and do      09/25/2014 PET scan    The right middle lobe pulmonary nodule is hypermetabolic, favored to represent a primary bronchogenic carcinoma.Equivocal mediastinal nodes, similar to surrounding blood pool. Bilateral adrenal hypermetabolism, felt to be physiologic      10/24/2014 Pathology Results    Lung, needle/core biopsy(ies), RML - ADENOCARCINOMA, SEE COMMENT metastatic adenocarcinoma of a colorectal primary      10/24/2014 Relapse/Recurrence         11/16/2014 Definitive Surgery    Bronchoscopy, right video-assisted thoracoscopy, wedge resection of right middle lobe by Dr. Servando Snare      11/16/2014 Pathology Results    Lung, wedge biopsy/resection, right lingula and small portion of middle lobe - METASTATIC ADENOCARCINOMA, CONSISTENT WITH COLORECTAL PRIMARY, SPANNING 2.0 CM. - THE SURGICAL RESECTION MARGINS ARE NEGATIVE FOR ADENOCARCINOMA.      11/16/2014 Remission    THE SURGICAL RESECTION MARGINS ARE NEGATIVE FOR ADENOCARCINOMA.      03/07/2015 Imaging    CT CAP- Interval resection of right middle lobe metastasis. No acute process or evidence of metastatic disease in the chest, abdomen or pelvis. Improved right upper lobe reticular nodular opacities are favored to represent resolving infection.      09/14/2015 Imaging    CT CAP- No findings of recurrent malignancy. No recurrence along the wedge resection site of the right middle lobe. Right anterior abdominal wall focal hernia containing a knuckle of small bowel without complicating feature.      06/13/2016 Imaging    CT CAP- 1. New right-sided pleural metastasis/mass. Other smaller right-sided pulmonary nodules which are all pleural-based and most likely represent pleural metastasis. 2. No convincing evidence of abdominopelvic nodal metastasis. 3. Constellation of findings, including pancreatic atrophy, duct dilatation, and pancreatic head soft tissue fullness which are highly suspicious for pancreatic adenocarcinoma. Metastatic disease felt much less likely. Consider endoscopic ultrasound sampling or ERCP. Cannot exclude superimposed  acute pancreatitis. 4. New enlargement of the appendiceal tip with subtle surrounding edema. Cannot exclude early or mild appendicitis. 5. Coronary artery atherosclerosis. Aortic atherosclerosis. 6. 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      06/13/2016 Progression    CT scan demonstrates progression of disease        06/19/2016 Procedure    EUS with FNA by Dr. Ardis Hughs      06/20/2016 Pathology Results    FINE NEEDLE ASPIRATION, ENDOSCOPIC, PANCREAS UNCINATE AREA(SPECIMEN 1 OF 1 COLLECTED 06/19/16): MALIGNANT CELLS CONSISTENT WITH METASTATIC ADENOCARCINOMA.      06/25/2016 PET scan    1. Two pleural-based nodules in the right hemithorax are hypermetabolic. Metastatic disease is a distinct consideration. The scattered pulmonary parenchymal nodule seen on previous diagnostic CT imaging are below the threshold for reliable resolution on PET imaging. 2. Hypermetabolic lesion pancreatic head, consistent with neoplasm. As noted on prior CT, adenocarcinoma is any consideration. No evidence for hypermetabolic abdominal lymphadenopathy. Mottled uptake noted in the liver, but no discrete hepatic metastases are evident on PET imaging. 3. Appendix remains distended up to 0.9-10 mm diameter with a stone towards the tip in subtle periappendiceal edema/inflammation. The appendix is hypermetabolic along its length. Imaging features are relatively stable in the 12 day interval since prior CT scan. While appendicitis is a consideration, the relative stability over 12 days would be unusual for that etiology. Continued close follow-up recommended.      06/26/2016 Pathology Results    Not enough tissue for foundationONE or K-ras testing.      07/07/2016 Procedure    Port placed by Dr. Arnoldo Morale      07/08/2016 -  Chemotherapy    The patient had pegfilgrastim (NEULASTA) injection 6 mg, 6 mg, Subcutaneous,  Once, 0 of 4 cycles  ondansetron (ZOFRAN) IVPB 8 mg, 8 mg, Intravenous,  Once, 0 of 4 cycles  leucovorin 804 mg in dextrose 5 % 250 mL infusion, 400 mg/m2, Intravenous,  Once, 0 of 4 cycles  oxaliplatin (ELOXATIN) 170 mg in dextrose 5 % 500 mL chemo infusion, 85 mg/m2, Intravenous,  Once, 0 of 4 cycles  fluorouracil (ADRUCIL) 4,800 mg in sodium chloride 0.9 % 150 mL chemo infusion, 2,400 mg/m2 = 4,800 mg,  Intravenous, 1 Day/Dose, 0 of 4 cycles  fluorouracil (ADRUCIL) chemo injection 800 mg, 400 mg/m2, Intravenous,  Once, 0 of 4 cycles  pegfilgrastim (NEULASTA) injection 6 mg, 6 mg, Subcutaneous,  Once, 2 of 2 cycles  ondansetron (ZOFRAN) IVPB 8 mg, 8 mg, Intravenous,  Once, 12 of 12 cycles  leucovorin 660 mg in dextrose 5 % 250 mL infusion, 660 mg (original dose ), Intravenous,  Once, 12 of 12 cycles Dose modification: 660 mg (Cycle 1)  oxaliplatin (ELOXATIN) 140 mg in dextrose 5 % 500 mL chemo infusion, 140 mg (original dose ), Intravenous,  Once, 12 of 12 cycles Dose modification: 140 mg (Cycle 1), 119 mg (85 % of original dose 140 mg, Cycle 8, Reason: Provider Judgment)  fluorouracil (ADRUCIL) 3,850 mg in sodium chloride 0.9 % 150 mL chemo infusion, 3,850 mg (original dose ), Intravenous, 1 Day/Dose, 12 of 12 cycles Dose modification: 3,850 mg (Cycle 1)  fluorouracil (ADRUCIL) chemo injection 700 mg, 700 mg (original dose ), Intravenous,  Once, 12 of 12 cycles Dose modification: 700 mg (Cycle 1)  palonosetron (ALOXI) injection 0.25 mg, 0.25 mg, Intravenous,  Once, 7 of 12 cycles Administration: 0.25 mg (09/30/2016)  bevacizumab (AVASTIN) 475 mg in sodium chloride 0.9 %  100 mL chemo infusion, 5 mg/kg = 475 mg, Intravenous,  Once, 6 of 11 cycles Administration: 500 mg (09/30/2016)  leucovorin 880 mg in dextrose 5 % 250 mL infusion, 400 mg/m2 = 880 mg, Intravenous,  Once, 7 of 12 cycles Administration: 880 mg (09/30/2016)  oxaliplatin (ELOXATIN) 185 mg in dextrose 5 % 500 mL chemo infusion, 85 mg/m2 = 185 mg, Intravenous,  Once, 7 of 12 cycles Administration: 185 mg (09/30/2016)  fluorouracil (ADRUCIL) chemo injection 900 mg, 400 mg/m2 = 900 mg, Intravenous,  Once, 7 of 12 cycles Administration: 900 mg (09/30/2016)  fluorouracil (ADRUCIL) 5,300 mg in sodium chloride 0.9 % 144 mL chemo infusion, 2,400 mg/m2 = 5,300 mg, Intravenous, 1 Day/Dose, 7 of 12 cycles Administration: 5,300 mg  (09/30/2016)  for chemotherapy treatment.        08/27/2016 Imaging    CT CAP- 1. Pulmonary metastatic disease is stable. 2. Pancreatic head mass, grossly stable, with progressive atrophy of the body and tail of the pancreas. Stable peripancreatic lymph node. 3. Mild circumferential rectal wall thickening, stable. 4. Aortic atherosclerosis (ICD10-170.0). Coronary artery calcification. 5. Hyperattenuating lesion off the lower pole left kidney, stable, too small to characterize. Continued attention on followup exams is warranted. 6. Persistent wall thickening and mild dilatation of the proximal appendix, of uncertain etiology. Continued attention on followup exams is warranted.      11/06/2016 Imaging    CT C/A/P: IMPRESSION: 1. Stable pulmonary metastatic disease. 2. Similar appearing pancreatic head mass. Progressed atrophy of the pancreatic body and tail. Stable peripancreatic lymph node. 3. Stable hypoattenuating lesion partially exophytic off the inferior pole of the left kidney. 4. Aortic atherosclerosis. 5. Persistent mild wall thickening and mild dilatation of the appendix.       01/30/2017 Imaging    Ct C/A/P: IMPRESSION: 1. Stable pulmonary metastatic disease. 2. Stable to slight increase in size of pancreatic head mass. 3.  Aortic Atherosclerosis (ICD10-I70.0). 4. Similar appearance of mild wall thickening of the appendix which contains an appendicolith.      05/01/2017 Imaging    CT C/A/P: Stable disease in the lungs, no progression of disease in the abdomen.          INTERVAL HISTORY:  Mr. Marschall 67 y.o. male returns for routine follow-up and consideration for next cycle of chemotherapy.   Due for next cycle of Avastin today.  At his last visit, Xeloda was dose reduced to 1500 mg twice daily (previously was 2000 mg twice daily).  He has noted a big increase in his energy levels.  He states "I am working now when I could not work before."  His appetite  is good.  He has occasional nausea/"dry heaves."  Denies any vomiting.  He continues to have peripheral neuropathy to his hands and feet, which is stable. Denies any rash.   His biggest complaint today is frequent stools, particularly after eating.  He states that the stools are formed and thin in shape, but are not watery or loose.  He states that when he walks, he has periods of bowel incontinence.  He has frequent urge to defecate particularly after meals.  Notes to have 12+ very small BMs per day.  He states "my bottom is very raw and burning all the time."  It has been quite sometime since he has seen his GI specialists.  Denies any frank bleeding episodes including blood in his stools, dark/tarry stools, hematuria.  Denies any changes in his bladder.  Due to start his next cycle  of Xeloda today.  He is asking for special documentation on his Xeloda prescription, so that it will be delivered to [facility] rather than him having to travel to [place] to pick up his chemotherapy.  Otherwise, he is largely without complaints today and feels ready for his next cycle of Avastin today.    REVIEW OF SYSTEMS:  Review of Systems - Oncology Per HPI.  Otherwise 12 point ROS reviewed and negative except as stated above.  PAST MEDICAL/SURGICAL HISTORY:  Past Medical History:  Diagnosis Date  . Chronic anticoagulation   . Colon cancer ([place]) 07/24/2010   rectal ca, inv adenocarcinoma  . Depression 04/01/2011  . Diabetes mellitus without complication ([place])   . DVT (deep venous thrombosis) ([place]) 05/09/2011  . GERD (gastroesophageal reflux disease)   . High output ileostomy ([place]) 05/21/2011  . History of kidney stones   . HTN (hypertension)   . Hx of radiation therapy 09/02/10 to 10/14/10   pelvis  . Lung metastasis ([place])   . Neuropathy   . Peripheral vascular disease ([place])    dvt's,pe  . Pneumonia    hx x3  . Pulmonary embolism ([place])   . Rectal cancer ([place]) 01/15/2011   S/P radiation  and concurrent 5-FU continuous infusion from 09/09/10- 10/10/10.  S/P proctectomy with colorectal anastomosis and diverting loop ileostomy on 11/14/10 at [facility] by Dr. Harlon Ditty. Pathology reveals a pT3b N1 with 3/20 lymph nodes.     Past Surgical History:  Procedure Laterality Date  . COLON SURGERY  11/14/2010   proctectomy with colorectal anastomosis and diverting loop ileostomy (temporary planned)  . COLONOSCOPY  07/2010   proximal rectal apple core mass 10-14cm from anal verge (adenocarcinoma), 2-3cm distal rectal carpet polyp s/p piecemeal snare polypectomy (adenoma)  . COLONOSCOPY  04/21/2012   RMR: Friable,fibrotic appearing colorectal anastomosis producing some luminal narrowing-not felt to be critical. path: focal erosion with slight inflammation and hyperemia. SURVEILLANCE DUE DEC 2015  . COLONOSCOPY N/A 11/24/2013   Dr. Rourk:somewhat fibrotic/friable anastomotic mucosal-status post biopsy (narrowing not felt to be clinically significant) Single colonic diverticulum. benign polypoid rectal mucosa  . colostomy reversal  april 2013  . ESOPHAGOGASTRODUODENOSCOPY  07/2010   RMR: schatki ring s/p dilation, small hh, SB bx benign  . ESOPHAGOGASTRODUODENOSCOPY N/A 09/04/2015   Procedure: ESOPHAGOGASTRODUODENOSCOPY (EGD);  Surgeon: Daneil Dolin, MD;  Location: [facility] END[facility]ITE;  Service: Endoscopy;  Laterality: N/A;  730  . EUS  08/2010   Dr. Owens Loffler. uT3N0 circumferential, nearly obstruction rectosigmoid adenocarcinoma, distal edge 12cm from anal verge  . EUS N/A 06/19/2016   Procedure: UPPER ENDOSCOPIC ULTRASOUND (EUS) RADIAL;  Surgeon: Milus Banister, MD;  Location: [facility] ENDOSCOPY;  Service: Endoscopy;  Laterality: N/A;  . HERNIA REPAIR     abd hernia repair  . IVC filter    . ivc filter    . port a cath placement    . PORT-A-CATH REMOVAL  09/24/2011   Procedure: REMOVAL PORT-A-CATH;  Surgeon: Donato Heinz, MD;  Location: [facility] ORS;  Service: General;  Laterality: N/A;  Minor Room  .  PORTACATH PLACEMENT Right 07/07/2016   Procedure: INSERTION PORT-A-CATH;  Surgeon: Aviva Signs, MD;  Location: [facility] ORS;  Service: General;  Laterality: Right;  Marland Kitchen VIDEO ASSISTED THORACOSCOPY (VATS)/WEDGE RESECTION Right 11/15/2014   Procedure: VIDEO ASSISTED THORACOSCOPY (VATS)/LUNG RESECTION WITH RIGHT LINGULECTOMY;  Surgeon: [name], MD;  Location: [place];  Service: Thoracic;  Laterality: Right;  Marland Kitchen VIDEO BRONCHOSCOPY N/A 11/15/2014  Procedure: VIDEO BRONCHOSCOPY;  Surgeon: [name], MD;  Location: [facility] OR;  Service: Thoracic;  Laterality: N/A;     SOCIAL HISTORY:  Social History   Socioeconomic History  . Marital status: Married    Spouse name: Not on file  . Number of children: 2  . Years of education: Not on file  . Highest education level: Not on file  Occupational History  . Occupation: self-employed Regulatory affairs officer, currently not working    Fish farm manager: SELF EMPLOYED  Social Needs  . Financial resource strain: Not on file  . Food insecurity:    Worry: Not on file    Inability: Not on file  . Transportation needs:    Medical: Not on file    Non-medical: Not on file  Tobacco Use  . Smoking status: Never Smoker  . Smokeless tobacco: Never Used  Substance and Sexual Activity  . Alcohol use: No  . Drug use: No  . Sexual activity: Yes    Birth control/protection: None    Comment: married  Lifestyle  . Physical activity:    Days per week: Not on file    Minutes per session: Not on file  . Stress: Not on file  Relationships  . Social connections:    Talks on phone: Not on file    Gets together: Not on file    Attends religious service: Not on file    Active member of club or organization: Not on file    Attends meetings of clubs or organizations: Not on file    Relationship status: Not on file  . Intimate partner violence:    Fear of current or ex partner: Not on file    Emotionally abused: Not on file    Physically abused: Not on file     Forced sexual activity: Not on file  Other Topics Concern  . Not on file  Social History Narrative  . Not on file    FAMILY HISTORY:  Family History  Problem Relation Age of Onset  . Cancer Brother        throat  . Cancer Brother        prostate  . Colon cancer Neg Hx   . Liver disease Neg Hx   . Inflammatory bowel disease Neg Hx     CURRENT MEDICATIONS:  Outpatient Encounter Medications as of 09/22/2017  Medication Sig  . acetaminophen (TYLENOL) 500 MG tablet Take 1,000 mg by mouth every 6 (six) hours as needed.  Marland Kitchen amoxicillin (AMOXIL) 500 MG capsule   . capecitabine (XELODA) 500 MG tablet Take 3 tablets (1,500 mg total) by mouth 2 (two) times daily after a meal.  . clonazePAM (KLONOPIN) 1 MG tablet Take 1 tablet (1 mg total) by mouth 2 (two) times daily as needed for anxiety.  . Diphenhyd-Hydrocort-Nystatin (FIRST-DUKES MOUTHWASH) SUSP Use as directed 5 mLs in the mouth or throat 4 (four) times daily.  . diphenoxylate-atropine (LOMOTIL) 2.5-0.025 MG tablet Take 1 tablet by mouth 4 (four) times daily as needed for diarrhea or loose stools.  Marland Kitchen dronabinol (MARINOL) 2.5 MG capsule Take 1 capsule (2.5 mg total) by mouth 2 (two) times daily before a meal.  . gabapentin (NEURONTIN) 300 MG capsule Take 3 capsules (900 mg total) by mouth 2 (two) times daily.  Marland Kitchen glimepiride (AMARYL) 2 MG tablet Take 2 mg by mouth daily with breakfast.   . HYDROcodone-acetaminophen (NORCO/VICODIN) 5-325 MG tablet Take 1-2 tablets by mouth every 6 (six) hours as needed for moderate pain.  Marland Kitchen  leucovorin in dextrose 5 % 250 mL Inject into the vein once.  Marland Kitchen levofloxacin (LEVAQUIN) 500 MG tablet   . lisinopril (PRINIVIL,ZESTRIL) 10 MG tablet   . mirtazapine (REMERON) 7.5 MG tablet Take 7.5 mg by mouth at bedtime.  Marland Kitchen omeprazole (PRILOSEC) 20 MG capsule Take 20 mg by mouth 2 (two) times daily before a meal.   . ondansetron (ZOFRAN) 8 MG tablet Take 1 tablet (8 mg total) by mouth 2 (two) times daily as needed for  refractory nausea / vomiting. Start on day 3 after chemotherapy.  . potassium chloride SA (K-DUR,KLOR-CON) 20 MEQ tablet Take 1 tablet (20 mEq total) by mouth 2 (two) times daily.  . predniSONE (DELTASONE) 10 MG tablet   . prochlorperazine (COMPAZINE) 10 MG tablet Take 1 tablet (10 mg total) by mouth every 6 (six) hours as needed (Nausea or vomiting).  . psyllium (REGULOID) 0.52 g capsule Take 0.52 g by mouth daily.  . rivaroxaban (XARELTO) 20 MG TABS tablet Take 1 tablet (20 mg total) by mouth daily with supper.  . tamsulosin (FLOMAX) 0.4 MG CAPS capsule Take 0.4 mg by mouth at bedtime.   . traZODone (DESYREL) 100 MG tablet Take 100 mg by mouth at bedtime.  Marland Kitchen zinc oxide 20 % ointment Apply 1 application topically as needed for irritation.  . [DISCONTINUED] capecitabine (XELODA) 500 MG tablet Take 3 tablets (1,500 mg total) by mouth 2 (two) times daily after a meal.   Facility-Administered Encounter Medications as of 09/22/2017  Medication  . sodium chloride 0.9 % injection 10 mL  . sodium chloride flush (NS) 0.9 % injection 10 mL    ALLERGIES:  Allergies  Allergen Reactions  . Oxycodone     Blisters, hallucinations  . Tramadol     Blisters, hallucinations  . Trazodone And Nefazodone Other (See Comments)    hallucinations     PHYSICAL EXAM:  ECOG Performance status: 1 - Symptomatic; remains largely independent   Vitals:   09/22/17 1104  BP: (!) 151/76  Pulse: 79  Resp: 18  Temp: (!) 97.5 F (36.4 C)   Filed Weights   09/22/17 1104  Weight: 210 lb 12.8 oz (95.6 kg)    Physical Exam  Constitutional: He is oriented to person, place, and time.  Seen in chemo chair in infusion area   HENT:  Head: Normocephalic.  Mouth/Throat: Oropharynx is clear and moist.  Eyes: Conjunctivae are normal. No scleral icterus.  Neck: Normal range of motion. Neck supple.  Cardiovascular: Normal rate and regular rhythm.  Pulmonary/Chest: Effort normal and breath sounds normal. No  respiratory distress.  Abdominal: Soft. Bowel sounds are normal. There is no tenderness.  Musculoskeletal: Normal range of motion.  Lymphadenopathy:    He has no cervical adenopathy.       Right: No supraclavicular adenopathy present.       Left: No supraclavicular adenopathy present.  Neurological: He is alert and oriented to person, place, and time. No cranial nerve deficit.  Skin: Skin is warm and dry. No rash noted.  Psychiatric: He has a normal mood and affect. Judgment normal.  Nursing note and vitals reviewed.    LABORATORY DATA:  I have reviewed the labs as listed.  CBC    Component Value Date/Time   WBC 6.1 09/22/2017 1017   RBC 4.01 (L) 09/22/2017 1017   HGB 12.6 (L) 09/22/2017 1017   HGB 11.6 (L) 02/04/2011 1059   HCT 38.7 (L) 09/22/2017 1017   HCT 34.7 (L) 02/04/2011 1059  PLT 201 09/22/2017 1017   PLT 282 02/04/2011 1059   MCV 96.5 09/22/2017 1017   MCV 76.0 (L) 02/04/2011 1059   MCH 31.4 09/22/2017 1017   MCHC 32.6 09/22/2017 1017   RDW 17.8 (H) 09/22/2017 1017   RDW 18.5 (H) 02/04/2011 1059   LYMPHSABS 1.9 09/22/2017 1017   LYMPHSABS 1.1 02/04/2011 1059   MONOABS 0.5 09/22/2017 1017   MONOABS 0.5 02/04/2011 1059   EOSABS 0.3 09/22/2017 1017   EOSABS 0.1 02/04/2011 1059   BASOSABS 0.0 09/22/2017 1017   BASOSABS 0.1 02/04/2011 1059   CMP Latest Ref Rng & Units 09/22/2017 09/01/2017 08/11/2017  Glucose 65 - 99 mg/dL 277(H) 269(H) 325(H)  BUN 6 - 20 mg/dL '14 10 12  ' Creatinine 0.61 - 1.24 mg/dL 0.83 0.76 0.85  Sodium 135 - 145 mmol/L 138 138 140  Potassium 3.5 - 5.1 mmol/L 4.3 4.3 4.3  Chloride 101 - 111 mmol/L 108 108 104  CO2 22 - 32 mmol/L '24 22 23  ' Calcium 8.9 - 10.3 mg/dL 8.7(L) 8.8(L) 8.9  Total Protein 6.5 - 8.1 g/dL 6.1(L) 6.0(L) 6.7  Total Bilirubin 0.3 - 1.2 mg/dL 0.9 1.1 1.4(H)  Alkaline Phos 38 - 126 U/L 76 82 66  AST 15 - 41 U/L '24 24 23  ' ALT 17 - 63 U/L '22 24 20    ' PENDING LABS:    DIAGNOSTIC IMAGING:    PATHOLOGY:      ASSESSMENT & PLAN:   Stage IVA adenocarcinoma of rectum with lung and pancreatic mets: -Due for next cycle of Avastin today. Labs and UA reviewed and are adequate for treatment. No proteinuria evident on UA today.  -Last CEA on 09/01/17 normal at 2.8. Last restaging scans done on 08/18/17; he will be due for restaging scans again sometime in July or August; will defer to MD to order these at subsequent follow-up visits.   -Tolerating dose-reduced Xeloda 1500 mg BID well.  His energy levels are much improved on reduced dose.  Continue Xeloda 1500 mg BID. New prescription printed, signed, and given to Durene Cal (patient advocate) to help with facilitating refill for future cycle.  Notation placed on prescription for medication to be delivered to [facility] for patient convenience.  -Return to cancer center in 3 weeks for follow-up and consideration for next cycle of Avastin.    Increased stool frequency:  -Denies diarrhea or loose stools; his biggest complaint is frequent stools.  This may be secondary to his previous colon/rectal surgery and radiation.   I have low suspicion that this is secondary to Avastin or Xeloda, as these symptoms have been present for quite some time.   -Suggested he could try OTC probiotics to see if this may help improve his stool consistency and frequency.  Also, I will reach out to Roseanne Kaufman, NP and Neil Crouch, PA-C with GI for their suggestions on how to manage Mr. Rother increased urge to defecate, as well as his increased stool frequency.  If they would like to see him back in their respective clinics, we will help facilitate that return visit.  Inbasket Epic message sent to them for their expert opinion.  Shared with Mr. Sollenberger that we would share with him their suggestions when we hear back from them; he agrees with this plan.        Dispo:  -Return to cancer center in 3 weeks for follow-up and consideration for next cycle of Avastin.      All questions were answered to  patient's stated satisfaction. Encouraged patient to call with any new concerns or questions before his next visit to the cancer center and we can certain see him sooner, if needed.      Orders placed this encounter:  No orders of the defined types were placed in this encounter.     Mike Craze, NP [place] [phone]

## 2017-09-23 ENCOUNTER — Ambulatory Visit (INDEPENDENT_AMBULATORY_CARE_PROVIDER_SITE_OTHER): Payer: Medicare Other | Admitting: Nurse Practitioner

## 2017-09-23 ENCOUNTER — Telehealth: Payer: Self-pay | Admitting: Gastroenterology

## 2017-09-23 ENCOUNTER — Encounter: Payer: Self-pay | Admitting: Nurse Practitioner

## 2017-09-23 VITALS — BP 138/83 | HR 86 | Temp 98.5°F | Ht 74.0 in | Wt 210.2 lb

## 2017-09-23 DIAGNOSIS — R159 Full incontinence of feces: Secondary | ICD-10-CM | POA: Diagnosis not present

## 2017-09-23 DIAGNOSIS — K6289 Other specified diseases of anus and rectum: Secondary | ICD-10-CM

## 2017-09-23 NOTE — Assessment & Plan Note (Addendum)
Noted rectal pain which worsens with each bowel movement.  His perianal area looked red and irritated.  This is likely from frequent wiping.  He does have fecal incontinence with noted fecal numbness and poor sphincter tone, as per above and per HPI and physical exam.  He is tried multiple ointments, wet wipes, Desitin, apothecary cream with no relief.  There is possibility of an element of radiation proctitis of the we cannot tell for sure.  As per below, discussed with Dr. Oneida Alar and Dr. Roseanne Kaufman absence and he may benefit from referral to [facility] surgery to consider palliative and ileostomy.  After discussing options with the patient and his wife, they stated previously talked about it and he is adamant that he does not want a colostomy/ostomy bag.  At this point they will try to do the best they can to live with it.  Recommended continue [place] apothecary cream when symptoms begin to help prevent worsening.  Continue to use Desitin and wet wipes.  He can use adult briefs as a fail safe for any incontinence especially if he is going to be at work or on a road trip.  He can try sitz baths for his rectal pain.  Return for follow-up in 2 months.  Call if there is anything we can do to help.

## 2017-09-23 NOTE — Telephone Encounter (Signed)
I spoke with the patient and made him aware of the appt.

## 2017-09-23 NOTE — Progress Notes (Signed)
Referring Provider: [name], MD Primary Care Physician:  [name], MD Primary GI:  Dr. [name]  Chief Complaint  Patient presents with  . other    15-18 BM's daily/ Not diarrhea/ bottom gets sore from wiping    HPI:   Charles [name] is a 67 y.o. male who presents on referral from oncology due to rectal pain and frequent stools.  The patient was last seen in our office 10/29/2016 also for rectal pain.  The patient has stage IV rectal cancer with metastasis to pancreas and lung.  At that time he noted frequent bowel movements rated [place] 5 with rectal burning.  Nitro helps but has intermittent swelling which makes it difficult to insert.  Miconazole cream helps although it does burn.  Pain shoots up through his rectum and stool output is better.  No abdominal pain.  They were declining pursuing colonoscopy at that time due to multiple other appointments and how it "would likely not change the outcome."  [place] [facility] cream with lidocaine, nitroglycerin, and nystatin.  Recommended update after cream for a week, follow-up in 3 months or as needed.  Progress report was never called.  Patient last saw oncology yesterday for stage IVa adenocarcinoma of the rectum with lung and pancreatic meds.  He is currently on chemotherapy.  He was initially diagnosed in 2012.  He did undergo radiation and surgery which was proctectomy with colorectal anastomosis and diverting loop ileostomy.  He has undergone multiple rounds of chemotherapy.  At his visit yesterday his energy level was improved, appetite good, occasional nausea but no vomiting.  Peripheral neuropathy noted.  His biggest complaint was frequent stools postprandially which are formed and thin, not watery or loose.  Has bowel incontinence when walking and frequent urge to defecate.  Has approximately 12+ very small bowel movements a day.  Complains of his rectum being overall and burning.  No frank blood.  It was felt his  frequent stools are likely secondary to previous colon/rectal surgery and radiation.  Recommended over-the-counter probiotics and follow-up with GI.  He will follow-up with oncology in 3 weeks.  Today he states he's having multiple formed stools a day without warning. Stools are not loose, no hematochezia. Has rectal soreness, mostly inside about a half inch. Has tried previously used ointments but after multiple stools and wiping he has swelling and can't get the medicine up inside. Denies N/V, melena, fever, chills, unintentional weight loss. Appetite is good, but variable with chemotherapy. He states he also has rectal numbness and cannot feel when he's passing a stool, which has been ongoing for a couple/few years. Denies chest pain, dyspnea, dizziness, lightheadedness, syncope, near syncope. Denies any other upper or lower GI symptoms.  He is using wet wipes. Has tried Desitin and other ointments.  Recent CT abdomen/pelvis did note rectal anastomosis with mild persistent luminal narrowing and mild thickening.  Past Medical History:  Diagnosis Date  . Chronic anticoagulation   . Colon cancer ([place]) 07/24/2010   rectal ca, inv adenocarcinoma  . Depression 04/01/2011  . Diabetes mellitus without complication ([place])   . DVT (deep venous thrombosis) ([place]) 05/09/2011  . GERD (gastroesophageal reflux disease)   . High output ileostomy ([place]) 05/21/2011  . History of kidney stones   . HTN (hypertension)   . Hx of radiation therapy 09/02/10 to 10/14/10   pelvis  . Lung metastasis ([place])   . Neuropathy   . Peripheral vascular disease ([place])  dvt's,pe  . Pneumonia    hx x3  . Pulmonary embolism ([place])   . Rectal cancer ([place]) 01/15/2011   S/P radiation and concurrent 5-FU continuous infusion from 09/09/10- 10/10/10.  S/P proctectomy with colorectal anastomosis and diverting loop ileostomy on 11/14/10 at [facility] by Dr. Harlon Ditty. Pathology reveals a pT3b N1 with 3/20 lymph nodes.      Past Surgical  History:  Procedure Laterality Date  . COLON SURGERY  11/14/2010   proctectomy with colorectal anastomosis and diverting loop ileostomy (temporary planned)  . COLONOSCOPY  07/2010   proximal rectal apple core mass 10-14cm from anal verge (adenocarcinoma), 2-3cm distal rectal carpet polyp s/p piecemeal snare polypectomy (adenoma)  . COLONOSCOPY  04/21/2012   RMR: Friable,fibrotic appearing colorectal anastomosis producing some luminal narrowing-not felt to be critical. path: focal erosion with slight inflammation and hyperemia. SURVEILLANCE DUE DEC 2015  . COLONOSCOPY N/A 11/24/2013   Dr. Rourk:somewhat fibrotic/friable anastomotic mucosal-status post biopsy (narrowing not felt to be clinically significant) Single colonic diverticulum. benign polypoid rectal mucosa  . colostomy reversal  april 2013  . ESOPHAGOGASTRODUODENOSCOPY  07/2010   RMR: schatki ring s/p dilation, small hh, SB bx benign  . ESOPHAGOGASTRODUODENOSCOPY N/A 09/04/2015   Procedure: ESOPHAGOGASTRODUODENOSCOPY (EGD);  Surgeon: Daneil Dolin, MD;  Location: [facility] ENDO SUITE;  Service: Endoscopy;  Laterality: N/A;  730  . EUS  08/2010   Dr. Owens Loffler. uT3N0 circumferential, nearly obstruction rectosigmoid adenocarcinoma, distal edge 12cm from anal verge  . EUS N/A 06/19/2016   Procedure: UPPER ENDOSCOPIC ULTRASOUND (EUS) RADIAL;  Surgeon: Milus Banister, MD;  Location: [facility] ENDOSCOPY;  Service: Endoscopy;  Laterality: N/A;  . HERNIA REPAIR     abd hernia repair  . IVC filter    . ivc filter    . port a cath placement    . PORT-A-CATH REMOVAL  09/24/2011   Procedure: REMOVAL PORT-A-CATH;  Surgeon: Donato Heinz, MD;  Location: [facility] ORS;  Service: General;  Laterality: N/A;  Minor Room  . PORTACATH PLACEMENT Right 07/07/2016   Procedure: INSERTION PORT-A-CATH;  Surgeon: Aviva Signs, MD;  Location: [facility] ORS;  Service: General;  Laterality: Right;  Marland Kitchen VIDEO ASSISTED THORACOSCOPY (VATS)/WEDGE RESECTION Right 11/15/2014   Procedure: VIDEO  ASSISTED THORACOSCOPY (VATS)/LUNG RESECTION WITH RIGHT LINGULECTOMY;  Surgeon: [name], MD;  Location: [place];  Service: Thoracic;  Laterality: Right;  Marland Kitchen VIDEO BRONCHOSCOPY N/A 11/15/2014   Procedure: VIDEO BRONCHOSCOPY;  Surgeon: [name], MD;  [facility];  Service: Thoracic;  Laterality: N/A;    Current Outpatient Medications  Medication Sig Dispense Refill  . acetaminophen (TYLENOL) 500 MG tablet Take 1,000 mg by mouth every 6 (six) hours as needed.    . clonazePAM (KLONOPIN) 1 MG tablet Take 1 tablet (1 mg total) by mouth 2 (two) times daily as needed for anxiety. 60 tablet 0  . Diphenhyd-Hydrocort-Nystatin (FIRST-DUKES MOUTHWASH) SUSP Use as directed 5 mLs in the mouth or throat 4 (four) times daily. 360 mL 3  . gabapentin (NEURONTIN) 300 MG capsule Take 3 capsules (900 mg total) by mouth 2 (two) times daily. 540 capsule 1  . glimepiride (AMARYL) 2 MG tablet Take 2 mg by mouth daily with breakfast.     . lisinopril (PRINIVIL,ZESTRIL) 10 MG tablet     . mirtazapine (REMERON) 7.5 MG tablet Take 7.5 mg by mouth at bedtime.    Marland Kitchen omeprazole (PRILOSEC) 20 MG capsule Take 20 mg by mouth 2 (two) times daily before a meal.     .  ondansetron (ZOFRAN) 8 MG tablet Take 1 tablet (8 mg total) by mouth 2 (two) times daily as needed for refractory nausea / vomiting. Start on day 3 after chemotherapy. 30 tablet 1  . prochlorperazine (COMPAZINE) 10 MG tablet Take 1 tablet (10 mg total) by mouth every 6 (six) hours as needed (Nausea or vomiting). 30 tablet 1  . rivaroxaban (XARELTO) 20 MG TABS tablet Take 1 tablet (20 mg total) by mouth daily with supper. 30 tablet 3  . tamsulosin (FLOMAX) 0.4 MG CAPS capsule Take 0.4 mg by mouth at bedtime.     . traZODone (DESYREL) 100 MG tablet Take 100 mg by mouth at bedtime.    . capecitabine (XELODA) 500 MG tablet Take 3 tablets (1,500 mg total) by mouth 2 (two) times daily after a meal. 84 tablet 1  . leucovorin in dextrose 5 % 250 mL Inject into  the vein once.    . psyllium (REGULOID) 0.52 g capsule Take 0.52 g by mouth daily.     No current facility-administered medications for this visit.    Facility-Administered Medications Ordered in Other Visits  Medication Dose Route Frequency Provider Last Rate Last Dose  . sodium chloride 0.9 % injection 10 mL  10 mL Intravenous PRN Everardo All, MD   10 mL at 08/22/11 1114  . sodium chloride flush (NS) 0.9 % injection 10 mL  10 mL Intracatheter PRN Ardath Sax, MD   10 mL at 05/08/17 1127    Allergies as of 09/23/2017 - Review Complete 09/23/2017  Allergen Reaction Noted  . Oxycodone  11/23/2014  . Tramadol  11/23/2014  . Trazodone and nefazodone Other (See Comments) 09/08/2014    Family History  Problem Relation Age of Onset  . Cancer Brother        throat  . Cancer Brother        prostate  . Colon cancer Neg Hx   . Liver disease Neg Hx   . Inflammatory bowel disease Neg Hx     Social History   Socioeconomic History  . Marital status: Married    Spouse name: Not on file  . Number of children: 2  . Years of education: Not on file  . Highest education level: Not on file  Occupational History  . Occupation: self-employed Regulatory affairs officer, currently not working    Fish farm manager: SELF EMPLOYED  Social Needs  . Financial resource strain: Not on file  . Food insecurity:    Worry: Not on file    Inability: Not on file  . Transportation needs:    Medical: Not on file    Non-medical: Not on file  Tobacco Use  . Smoking status: Never Smoker  . Smokeless tobacco: Never Used  Substance and Sexual Activity  . Alcohol use: No  . Drug use: No  . Sexual activity: Yes    Birth control/protection: None    Comment: married  Lifestyle  . Physical activity:    Days per week: Not on file    Minutes per session: Not on file  . Stress: Not on file  Relationships  . Social connections:    Talks on phone: Not on file    Gets together: Not on file    Attends  religious service: Not on file    Active member of club or organization: Not on file    Attends meetings of clubs or organizations: Not on file    Relationship status: Not on file  Other Topics Concern  . Not  on file  Social History Narrative  . Not on file    Review of Systems: General: Negative for anorexia, weight loss, fever, chills, fatigue, weakness. ENT: Negative for hoarseness, difficulty swallowing , nasal congestion. CV: Negative for chest pain, angina, palpitations, dyspnea on exertion, peripheral edema.  Respiratory: Negative for dyspnea at rest, dyspnea on exertion, cough, sputum, wheezing.  GI: See history of present illness. Endo: Negative for unusual weight change.  Heme: Negative for bruising or bleeding.   Physical Exam: BP 138/83   Pulse 86   Temp 98.5 F (36.9 C) (Oral)   Ht 6\' 2"  (1.88 m)   Wt 210 lb 3.2 oz (95.3 kg)   BMI 26.99 kg/m  General:   Alert and oriented. Pleasant and cooperative. Well-nourished and well-developed.  Eyes:  Without icterus, sclera clear and conjunctiva pink.  Ears:  Normal auditory acuity. Cardiovascular:  S1, S2 present without murmurs appreciated. Extremities without clubbing or edema. Respiratory:  Clear to auscultation bilaterally. No wheezes, rales, or rhonchi. No distress.  Gastrointestinal:  +BS, soft, non-tender and non-distended. No HSM noted. No guarding or rebound. No masses appreciated.  Rectal:  Perianal area red and irritated looking. Poor sphincter tone, painful internal rectum. No obvious masses or blood. Musculoskalatal:  Symmetrical without gross deformities. Skin:  Intact without significant lesions or rashes. Neurologic:  Alert and oriented x4;  grossly normal neurologically. Psych:  Alert and cooperative. Normal mood and affect. Heme/Lymph/Immune: No excessive bruising noted.    09/23/2017 3:36 PM   Disclaimer: This note was dictated with voice recognition software. Similar sounding words can  inadvertently be transcribed and may not be corrected upon review.

## 2017-09-23 NOTE — Assessment & Plan Note (Addendum)
She notes fecal incontinence of solid stools.  He has up to a dozen to 15 stools a day.  He notes rectal numbness and does not feel when he is passing a stool.  This causes frequent embarrassing accidents.  This is been ongoing for the past 1 to 2 years.  With frequent wiping due to frequent stools he does have rectal pain and irritation.  He is tried all sorts of creams including the apothecary cream we have sent and after multiple bowel movements he has internal swelling which makes it difficult to apply medication internally.  Discussed with Dr. Oneida Alar in Dr. Roseanne Kaufman absence. Will recommend referral back to [facility] for consideration of palliative end ileostomy due to rectal numbness and incontinence due to poor sphincter tone s/p previous surgery and radiation.  After discussing options with the patient and his wife, they stated previously talked about it and he is adamant that he does not want a colostomy/ostomy bag.  At this point they will try to do the best they can to live with it.  Recommended continue [place] apothecary cream when symptoms begin to help prevent worsening.  Continue to use Desitin and wet wipes.  He can use adult briefs as a fail safe for any incontinence especially if he is going to be at work or on a road trip.  He can try sitz baths for his rectal pain.  Return for follow-up in 2 months.  Call if there is anything we can do to help.

## 2017-09-23 NOTE — Telephone Encounter (Signed)
Patient is scheduled for today at 3 pm to see Randall Hiss

## 2017-09-23 NOTE — Patient Instructions (Signed)
1. Continue to use wet wipes, Desitin. 2. You can use sitz bath's to help with irritation from multiple wiping. 3. When you start to have rectal burning, before you have swelling and difficulty applying cream, try to use the apothecary cream at that time.  This can help prevent worsening burning symptoms later in the day. 4. You can use depends/adult briefs as needed as a back-up. 5. Return for follow-up in 2 months. 6. Please do not hesitate to call if there is anything we can do to help  At [facility] we value your feedback. You may receive a survey about your visit today. Please share your experience as we strive to create trusting relationships with our patients to provide genuine, compassionate, quality care.  It was good to meet you both today!  I hope you have a wonderful summer!!

## 2017-09-23 NOTE — Telephone Encounter (Signed)
Received request from Mike Craze, NP at [facility] regarding this patient. He has not been seen in nearly a year and is have frequent BMs with incontinence/rectal pain.   Please put him on today's schedule with EG at 3pm, I have discussed with EG.

## 2017-09-24 NOTE — Progress Notes (Signed)
cc'd to pcp 

## 2017-10-12 ENCOUNTER — Other Ambulatory Visit (HOSPITAL_COMMUNITY): Payer: Self-pay

## 2017-10-12 DIAGNOSIS — C20 Malignant neoplasm of rectum: Secondary | ICD-10-CM

## 2017-10-12 DIAGNOSIS — C7800 Secondary malignant neoplasm of unspecified lung: Secondary | ICD-10-CM

## 2017-10-12 DIAGNOSIS — C187 Malignant neoplasm of sigmoid colon: Secondary | ICD-10-CM

## 2017-10-13 ENCOUNTER — Encounter (HOSPITAL_COMMUNITY): Payer: Self-pay | Admitting: Hematology

## 2017-10-13 ENCOUNTER — Inpatient Hospital Stay (HOSPITAL_COMMUNITY): Payer: Medicare Other

## 2017-10-13 ENCOUNTER — Other Ambulatory Visit: Payer: Self-pay

## 2017-10-13 ENCOUNTER — Inpatient Hospital Stay (HOSPITAL_COMMUNITY): Payer: Medicare Other | Attending: Hematology

## 2017-10-13 ENCOUNTER — Inpatient Hospital Stay (HOSPITAL_BASED_OUTPATIENT_CLINIC_OR_DEPARTMENT_OTHER): Payer: Medicare Other | Admitting: Hematology

## 2017-10-13 VITALS — BP 118/75 | HR 69 | Temp 97.9°F | Resp 18

## 2017-10-13 VITALS — BP 147/78 | HR 83 | Temp 97.7°F | Resp 18 | Wt 210.4 lb

## 2017-10-13 DIAGNOSIS — C20 Malignant neoplasm of rectum: Secondary | ICD-10-CM

## 2017-10-13 DIAGNOSIS — C7889 Secondary malignant neoplasm of other digestive organs: Secondary | ICD-10-CM | POA: Diagnosis not present

## 2017-10-13 DIAGNOSIS — Z5112 Encounter for antineoplastic immunotherapy: Secondary | ICD-10-CM | POA: Insufficient documentation

## 2017-10-13 DIAGNOSIS — G629 Polyneuropathy, unspecified: Secondary | ICD-10-CM | POA: Insufficient documentation

## 2017-10-13 DIAGNOSIS — Z7901 Long term (current) use of anticoagulants: Secondary | ICD-10-CM | POA: Diagnosis not present

## 2017-10-13 DIAGNOSIS — Z86718 Personal history of other venous thrombosis and embolism: Secondary | ICD-10-CM | POA: Insufficient documentation

## 2017-10-13 DIAGNOSIS — C187 Malignant neoplasm of sigmoid colon: Secondary | ICD-10-CM

## 2017-10-13 DIAGNOSIS — C7800 Secondary malignant neoplasm of unspecified lung: Secondary | ICD-10-CM | POA: Insufficient documentation

## 2017-10-13 DIAGNOSIS — G893 Neoplasm related pain (acute) (chronic): Secondary | ICD-10-CM

## 2017-10-13 DIAGNOSIS — Z79899 Other long term (current) drug therapy: Secondary | ICD-10-CM

## 2017-10-13 LAB — COMPREHENSIVE METABOLIC PANEL
ALT: 23 U/L (ref 17–63)
AST: 24 U/L (ref 15–41)
Albumin: 3.7 g/dL (ref 3.5–5.0)
Alkaline Phosphatase: 79 U/L (ref 38–126)
Anion gap: 7 (ref 5–15)
BUN: 14 mg/dL (ref 6–20)
CO2: 26 mmol/L (ref 22–32)
Calcium: 8.9 mg/dL (ref 8.9–10.3)
Chloride: 107 mmol/L (ref 101–111)
Creatinine, Ser: 0.76 mg/dL (ref 0.61–1.24)
GFR calc Af Amer: >60 mL/min (ref >60)
GFR calc non Af Amer: >60 mL/min (ref >60)
Glucose, Bld: 281 mg/dL — ABNORMAL HIGH (ref 65–99)
Potassium: 3.9 mmol/L (ref 3.5–5.1)
Sodium: 140 mmol/L (ref 135–145)
Total Bilirubin: 1.7 mg/dL — ABNORMAL HIGH (ref 0.3–1.2)
Total Protein: 6.3 g/dL — ABNORMAL LOW (ref 6.5–8.1)

## 2017-10-13 LAB — CBC WITH DIFFERENTIAL/PLATELET
Basophils Absolute: 0 K/uL (ref 0.0–0.1)
Basophils Relative: 0 %
Eosinophils Absolute: 0.2 K/uL (ref 0.0–0.7)
Eosinophils Relative: 3 %
HCT: 40.6 % (ref 39.0–52.0)
Hemoglobin: 13.2 g/dL (ref 13.0–17.0)
Lymphocytes Relative: 34 %
Lymphs Abs: 2.1 K/uL (ref 0.7–4.0)
MCH: 32.3 pg (ref 26.0–34.0)
MCHC: 32.5 g/dL (ref 30.0–36.0)
MCV: 99.3 fL (ref 78.0–100.0)
Monocytes Absolute: 0.6 K/uL (ref 0.1–1.0)
Monocytes Relative: 10 %
Neutro Abs: 3.3 K/uL (ref 1.7–7.7)
Neutrophils Relative %: 53 %
Platelets: 178 K/uL (ref 150–400)
RBC: 4.09 MIL/uL — ABNORMAL LOW (ref 4.22–5.81)
RDW: 18.3 % — ABNORMAL HIGH (ref 11.5–15.5)
WBC: 6.2 K/uL (ref 4.0–10.5)

## 2017-10-13 LAB — URINALYSIS, DIPSTICK ONLY
Bilirubin Urine: NEGATIVE
Glucose, UA: 150 mg/dL — AB
Hgb urine dipstick: NEGATIVE
Ketones, ur: NEGATIVE mg/dL
Leukocytes, UA: NEGATIVE
Nitrite: NEGATIVE
Protein, ur: NEGATIVE mg/dL
Specific Gravity, Urine: 1.023 (ref 1.005–1.030)
pH: 5 (ref 5.0–8.0)

## 2017-10-13 LAB — MAGNESIUM: Magnesium: 1.5 mg/dL — ABNORMAL LOW (ref 1.7–2.4)

## 2017-10-13 MED ORDER — SODIUM CHLORIDE 0.9 % IV SOLN
Freq: Once | INTRAVENOUS | Status: AC
  Administered 2017-10-13: 11:00:00 via INTRAVENOUS

## 2017-10-13 MED ORDER — SODIUM CHLORIDE 0.9% FLUSH
10.0000 mL | INTRAVENOUS | Status: DC | PRN
  Administered 2017-10-13: 10 mL
  Filled 2017-10-13: qty 10

## 2017-10-13 MED ORDER — MAGNESIUM OXIDE 400 (241.3 MG) MG PO TABS
400.0000 mg | ORAL_TABLET | Freq: Two times a day (BID) | ORAL | 3 refills | Status: DC
Start: 2017-10-13 — End: 2018-06-01

## 2017-10-13 MED ORDER — SODIUM CHLORIDE 0.9 % IV SOLN
700.0000 mg | Freq: Once | INTRAVENOUS | Status: AC
  Administered 2017-10-13: 700 mg via INTRAVENOUS
  Filled 2017-10-13: qty 8

## 2017-10-13 MED ORDER — HEPARIN SOD (PORK) LOCK FLUSH 100 UNIT/ML IV SOLN
500.0000 [IU] | Freq: Once | INTRAVENOUS | Status: AC | PRN
  Administered 2017-10-13: 500 [IU]

## 2017-10-13 NOTE — Patient Instructions (Signed)
[facility] Discharge Instructions for Patients Receiving Chemotherapy  Today you received the following chemotherapy agents: Avastin. If you develop nausea and vomiting that is not controlled by your nausea medication, call the clinic.   BELOW ARE SYMPTOMS THAT SHOULD BE REPORTED IMMEDIATELY:  *FEVER GREATER THAN 100.5 F  *CHILLS WITH OR WITHOUT FEVER  NAUSEA AND VOMITING THAT IS NOT CONTROLLED WITH YOUR NAUSEA MEDICATION  *UNUSUAL SHORTNESS OF BREATH  *UNUSUAL BRUISING OR BLEEDING  TENDERNESS IN MOUTH AND THROAT WITH OR WITHOUT PRESENCE OF ULCERS  *URINARY PROBLEMS  *BOWEL PROBLEMS  UNUSUAL RASH Items with * indicate a potential emergency and should be followed up as soon as possible.  Feel free to call the clinic should you have any questions or concerns. The clinic phone number is [phone].  Please show the CHEMO ALERT CARD at check-in to the Emergency Department and triage nurse.   

## 2017-10-13 NOTE — Assessment & Plan Note (Signed)
1.  Stage IV rectal cancer with lung and pancreatic metastasis: -He was on maintenance 5-FU and bevacizumab.  He started Xeloda first cycle on 08/07/2017 and took 4 pills twice a day.  He is on a 2 weeks on 1 week of regimen.  He felt very tired after starting Xeloda.  He also had flulike symptoms.  His appetite has gone down. -Cycle 2 Xeloda dose reduced to 1500 mg twice a day, 2 weeks on 1 week off, tolerated better.  Tolerating Avastin better.  He will start his next cycle of Xeloda at the same dose tomorrow.  He will proceed with Avastin today.  We will see him back in 3 weeks for follow-up.  I plan to repeat scans in 6 weeks.  2.  Neuropathy: He has continues numbness and tingling in the hands and feet.  He is taking gabapentin 900 mg twice daily.  He gets pains if he does not take gabapentin.  3.  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  He will continue Xarelto 20 mg daily.  4. Rectal pain: He takes hydrocodone 5 mg tablet as needed for rectal pain.  He has not needed any pill in the last 2 weeks.  5.  Hypomagnesemia: His magnesium is 1.5.  I will send a prescription for magnesium twice daily.

## 2017-10-13 NOTE — Progress Notes (Signed)
[address], [place] [address]   CLINIC:  Medical Oncology/Hematology  PCP:  [name], MD [place] [address] [phone]   REASON FOR VISIT:  Follow-up for metastatic rectal cancer.  CURRENT THERAPY: Xeloda and bevacizumab.  BRIEF ONCOLOGIC HISTORY:    Rectal cancer ([place])   07/24/2010 Initial Diagnosis    Invasive adenocarcinoma of rectum      09/09/2010 Concurrent Chemotherapy    S/P radiation and concurrent 5-FU continuous infusion from 09/09/10- 10/10/10.      11/14/2010 Surgery    S/P proctectomy with colorectal anastomosis and diverting loop ileostomy on 11/14/10 at [facility] by Dr. Harlon Ditty. Pathology reveals a pT3b N1 with 3/20 lymph nodes.      02/05/2011 - 07/14/2011 Chemotherapy    FOLFOX      08/18/2011 Surgery    Approximate date of surgery- [place] by Dr. Harlon Ditty       Remission         11/24/2013 Survivorship    Colonoscopy- somewhat fibrotic/friable anastomotic mucosal-status post biopsy (narrowing not felt to be clinically significant).  Negative pathology for malignancy      09/12/2014 Imaging    DVT in the left femoral venous system, left common iliac vein, IVC, and within the IVC filter Right lower extremity venography confirms chronic occlusion of the femoral venous system with collateralization. The right iliac venous system is patent and do      09/25/2014 PET scan    The right middle lobe pulmonary nodule is hypermetabolic, favored to represent a primary bronchogenic carcinoma.Equivocal mediastinal nodes, similar to surrounding blood pool. Bilateral adrenal hypermetabolism, felt to be physiologic      10/24/2014 Pathology Results    Lung, needle/core biopsy(ies), RML - ADENOCARCINOMA, SEE COMMENT metastatic adenocarcinoma of a colorectal primary      10/24/2014 Relapse/Recurrence         11/16/2014 Definitive Surgery    Bronchoscopy, right video-assisted thoracoscopy, wedge  resection of right middle lobe by Dr. Servando Snare      11/16/2014 Pathology Results    Lung, wedge biopsy/resection, right lingula and small portion of middle lobe - METASTATIC ADENOCARCINOMA, CONSISTENT WITH COLORECTAL PRIMARY, SPANNING 2.0 CM. - THE SURGICAL RESECTION MARGINS ARE NEGATIVE FOR ADENOCARCINOMA.      11/16/2014 Remission    THE SURGICAL RESECTION MARGINS ARE NEGATIVE FOR ADENOCARCINOMA.      03/07/2015 Imaging    CT CAP- Interval resection of right middle lobe metastasis. No acute process or evidence of metastatic disease in the chest, abdomen or pelvis. Improved right upper lobe reticular nodular opacities are favored to represent resolving infection.      09/14/2015 Imaging    CT CAP- No findings of recurrent malignancy. No recurrence along the wedge resection site of the right middle lobe. Right anterior abdominal wall focal hernia containing a knuckle of small bowel without complicating feature.      06/13/2016 Imaging    CT CAP- 1. New right-sided pleural metastasis/mass. Other smaller right-sided pulmonary nodules which are all pleural-based and most likely represent pleural metastasis. 2. No convincing evidence of abdominopelvic nodal metastasis. 3. Constellation of findings, including pancreatic atrophy, duct dilatation, and pancreatic head soft tissue fullness which are highly suspicious for pancreatic adenocarcinoma. Metastatic disease felt much less likely. Consider endoscopic ultrasound sampling or ERCP. Cannot exclude superimposed acute pancreatitis. 4. New enlargement of the appendiceal tip with subtle surrounding edema. Cannot exclude early or mild appendicitis. 5. Coronary artery  atherosclerosis. Aortic atherosclerosis. 6. 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      06/13/2016 Progression    CT scan demonstrates progression of disease      06/19/2016 Procedure    EUS with FNA by Dr. Ardis Hughs      06/20/2016 Pathology Results     FINE NEEDLE ASPIRATION, ENDOSCOPIC, PANCREAS UNCINATE AREA(SPECIMEN 1 OF 1 COLLECTED 06/19/16): MALIGNANT CELLS CONSISTENT WITH METASTATIC ADENOCARCINOMA.      06/25/2016 PET scan    1. Two pleural-based nodules in the right hemithorax are hypermetabolic. Metastatic disease is a distinct consideration. The scattered pulmonary parenchymal nodule seen on previous diagnostic CT imaging are below the threshold for reliable resolution on PET imaging. 2. Hypermetabolic lesion pancreatic head, consistent with neoplasm. As noted on prior CT, adenocarcinoma is any consideration. No evidence for hypermetabolic abdominal lymphadenopathy. Mottled uptake noted in the liver, but no discrete hepatic metastases are evident on PET imaging. 3. Appendix remains distended up to 0.9-10 mm diameter with a stone towards the tip in subtle periappendiceal edema/inflammation. The appendix is hypermetabolic along its length. Imaging features are relatively stable in the 12 day interval since prior CT scan. While appendicitis is a consideration, the relative stability over 12 days would be unusual for that etiology. Continued close follow-up recommended.      06/26/2016 Pathology Results    Not enough tissue for foundationONE or K-ras testing.      07/07/2016 Procedure    Port placed by Dr. Arnoldo Morale      07/08/2016 -  Chemotherapy    The patient had pegfilgrastim (NEULASTA) injection 6 mg, 6 mg, Subcutaneous,  Once, 0 of 4 cycles  ondansetron (ZOFRAN) IVPB 8 mg, 8 mg, Intravenous,  Once, 0 of 4 cycles  leucovorin 804 mg in dextrose 5 % 250 mL infusion, 400 mg/m2, Intravenous,  Once, 0 of 4 cycles  oxaliplatin (ELOXATIN) 170 mg in dextrose 5 % 500 mL chemo infusion, 85 mg/m2, Intravenous,  Once, 0 of 4 cycles  fluorouracil (ADRUCIL) 4,800 mg in sodium chloride 0.9 % 150 mL chemo infusion, 2,400 mg/m2 = 4,800 mg, Intravenous, 1 Day/Dose, 0 of 4 cycles  fluorouracil (ADRUCIL) chemo injection 800 mg, 400 mg/m2,  Intravenous,  Once, 0 of 4 cycles  pegfilgrastim (NEULASTA) injection 6 mg, 6 mg, Subcutaneous,  Once, 2 of 2 cycles  ondansetron (ZOFRAN) IVPB 8 mg, 8 mg, Intravenous,  Once, 12 of 12 cycles  leucovorin 660 mg in dextrose 5 % 250 mL infusion, 660 mg (original dose ), Intravenous,  Once, 12 of 12 cycles Dose modification: 660 mg (Cycle 1)  oxaliplatin (ELOXATIN) 140 mg in dextrose 5 % 500 mL chemo infusion, 140 mg (original dose ), Intravenous,  Once, 12 of 12 cycles Dose modification: 140 mg (Cycle 1), 119 mg (85 % of original dose 140 mg, Cycle 8, Reason: Provider Judgment)  fluorouracil (ADRUCIL) 3,850 mg in sodium chloride 0.9 % 150 mL chemo infusion, 3,850 mg (original dose ), Intravenous, 1 Day/Dose, 12 of 12 cycles Dose modification: 3,850 mg (Cycle 1)  fluorouracil (ADRUCIL) chemo injection 700 mg, 700 mg (original dose ), Intravenous,  Once, 12 of 12 cycles Dose modification: 700 mg (Cycle 1)  palonosetron (ALOXI) injection 0.25 mg, 0.25 mg, Intravenous,  Once, 7 of 12 cycles Administration: 0.25 mg (09/30/2016)  bevacizumab (AVASTIN) 475 mg in sodium chloride 0.9 % 100 mL chemo infusion, 5 mg/kg = 475 mg, Intravenous,  Once, 6 of 11 cycles Administration: 500 mg (09/30/2016)  leucovorin 880  mg in dextrose 5 % 250 mL infusion, 400 mg/m2 = 880 mg, Intravenous,  Once, 7 of 12 cycles Administration: 880 mg (09/30/2016)  oxaliplatin (ELOXATIN) 185 mg in dextrose 5 % 500 mL chemo infusion, 85 mg/m2 = 185 mg, Intravenous,  Once, 7 of 12 cycles Administration: 185 mg (09/30/2016)  fluorouracil (ADRUCIL) chemo injection 900 mg, 400 mg/m2 = 900 mg, Intravenous,  Once, 7 of 12 cycles Administration: 900 mg (09/30/2016)  fluorouracil (ADRUCIL) 5,300 mg in sodium chloride 0.9 % 144 mL chemo infusion, 2,400 mg/m2 = 5,300 mg, Intravenous, 1 Day/Dose, 7 of 12 cycles Administration: 5,300 mg (09/30/2016)  for chemotherapy treatment.        08/27/2016 Imaging    CT CAP- 1. Pulmonary  metastatic disease is stable. 2. Pancreatic head mass, grossly stable, with progressive atrophy of the body and tail of the pancreas. Stable peripancreatic lymph node. 3. Mild circumferential rectal wall thickening, stable. 4. Aortic atherosclerosis (ICD10-170.0). Coronary artery calcification. 5. Hyperattenuating lesion off the lower pole left kidney, stable, too small to characterize. Continued attention on followup exams is warranted. 6. Persistent wall thickening and mild dilatation of the proximal appendix, of uncertain etiology. Continued attention on followup exams is warranted.      11/06/2016 Imaging    CT C/A/P: IMPRESSION: 1. Stable pulmonary metastatic disease. 2. Similar appearing pancreatic head mass. Progressed atrophy of the pancreatic body and tail. Stable peripancreatic lymph node. 3. Stable hypoattenuating lesion partially exophytic off the inferior pole of the left kidney. 4. Aortic atherosclerosis. 5. Persistent mild wall thickening and mild dilatation of the appendix.       01/30/2017 Imaging    Ct C/A/P: IMPRESSION: 1. Stable pulmonary metastatic disease. 2. Stable to slight increase in size of pancreatic head mass. 3.  Aortic Atherosclerosis (ICD10-I70.0). 4. Similar appearance of mild wall thickening of the appendix which contains an appendicolith.      05/01/2017 Imaging    CT C/A/P: Stable disease in the lungs, no progression of disease in the abdomen.        CANCER STAGING: Cancer Staging Rectal cancer ([place]) Staging form: Colon and Rectum, AJCC 7th Edition - Clinical: Stage IIIB (T3, N1, M0) - Signed by [name], MD on 02/04/2011 - Pathologic stage from 11/17/2014: Stage IVA (M1a) - Signed by Baird Cancer, PA-C on 09/14/2015    INTERVAL HISTORY:  Charles [name] 67 y.o. male returns for of metastatic rectal cancer.  He is tolerating Xeloda very well.  Denies any mucositis, hand-foot skin reaction.  Denies any fevers or  infections.  Numbness in the hands and feet has been stable.  Able to do all his day-to-day activities.  Denies any muscle cramping.  Rectal pain is very infrequent and is not requiring hydrocodone on a regular basis.  He is in his off week at this time.    REVIEW OF SYSTEMS:  Review of Systems  Cardiovascular: Positive for leg swelling.  Neurological: Positive for numbness.  All other systems reviewed and are negative.    PAST MEDICAL/SURGICAL HISTORY:  Past Medical History:  Diagnosis Date  . Chronic anticoagulation   . Colon cancer ([place]) 07/24/2010   rectal ca, inv adenocarcinoma  . Depression 04/01/2011  . Diabetes mellitus without complication ([place])   . DVT (deep venous thrombosis) ([place]) 05/09/2011  . GERD (gastroesophageal reflux disease)   . High output ileostomy ([place]) 05/21/2011  . History of kidney stones   . HTN (hypertension)   . Hx of radiation therapy 09/02/10 to 10/14/10  pelvis  . Lung metastasis ([place])   . Neuropathy   . Peripheral vascular disease ([place])    dvt's,pe  . Pneumonia    hx x3  . Pulmonary embolism ([place])   . Rectal cancer ([place]) 01/15/2011   S/P radiation and concurrent 5-FU continuous infusion from 09/09/10- 10/10/10.  S/P proctectomy with colorectal anastomosis and diverting loop ileostomy on 11/14/10 at [facility] by Dr. Harlon Ditty. Pathology reveals a pT3b N1 with 3/20 lymph nodes.     Past Surgical History:  Procedure Laterality Date  . COLON SURGERY  11/14/2010   proctectomy with colorectal anastomosis and diverting loop ileostomy (temporary planned)  . COLONOSCOPY  07/2010   proximal rectal apple core mass 10-14cm from anal verge (adenocarcinoma), 2-3cm distal rectal carpet polyp s/p piecemeal snare polypectomy (adenoma)  . COLONOSCOPY  04/21/2012   RMR: Friable,fibrotic appearing colorectal anastomosis producing some luminal narrowing-not felt to be critical. path: focal erosion with slight inflammation and hyperemia. SURVEILLANCE DUE DEC 2015  .  COLONOSCOPY N/A 11/24/2013   Dr. Rourk:somewhat fibrotic/friable anastomotic mucosal-status post biopsy (narrowing not felt to be clinically significant) Single colonic diverticulum. benign polypoid rectal mucosa  . colostomy reversal  april 2013  . ESOPHAGOGASTRODUODENOSCOPY  07/2010   RMR: schatki ring s/p dilation, small hh, SB bx benign  . ESOPHAGOGASTRODUODENOSCOPY N/A 09/04/2015   Procedure: ESOPHAGOGASTRODUODENOSCOPY (EGD);  Surgeon: Daneil Dolin, MD;  Location: [facility] ENDO SUITE;  Service: Endoscopy;  Laterality: N/A;  730  . EUS  08/2010   Dr. Owens Loffler. uT3N0 circumferential, nearly obstruction rectosigmoid adenocarcinoma, distal edge 12cm from anal verge  . EUS N/A 06/19/2016   Procedure: UPPER ENDOSCOPIC ULTRASOUND (EUS) RADIAL;  Surgeon: Milus Banister, MD;  Location: [facility] ENDOSCOPY;  Service: Endoscopy;  Laterality: N/A;  . HERNIA REPAIR     abd hernia repair  . IVC filter    . ivc filter    . port a cath placement    . PORT-A-CATH REMOVAL  09/24/2011   Procedure: REMOVAL PORT-A-CATH;  Surgeon: Donato Heinz, MD;  Location: [facility] ORS;  Service: General;  Laterality: N/A;  Minor Room  . PORTACATH PLACEMENT Right 07/07/2016   Procedure: INSERTION PORT-A-CATH;  Surgeon: Aviva Signs, MD;  Location: [facility] ORS;  Service: General;  Laterality: Right;  Marland Kitchen VIDEO ASSISTED THORACOSCOPY (VATS)/WEDGE RESECTION Right 11/15/2014   Procedure: VIDEO ASSISTED THORACOSCOPY (VATS)/LUNG RESECTION WITH RIGHT LINGULECTOMY;  Surgeon: [name], MD;  Location: [facility];  Service: Thoracic;  Laterality: Right;  Marland Kitchen VIDEO BRONCHOSCOPY N/A 11/15/2014   Procedure: VIDEO BRONCHOSCOPY;  Surgeon: [name], MD;  Location[facility];  Service: Thoracic;  Laterality: N/A;     SOCIAL HISTORY:  Social History   Socioeconomic History  . Marital status: Married    Spouse name: Not on file  . Number of children: 2  . Years of education: Not on file  . Highest education level: Not on file  Occupational History    . Occupation: self-employed Regulatory affairs officer, currently not working    Fish farm manager: SELF EMPLOYED  Social Needs  . Financial resource strain: Not on file  . Food insecurity:    Worry: Not on file    Inability: Not on file  . Transportation needs:    Medical: Not on file    Non-medical: Not on file  Tobacco Use  . Smoking status: Never Smoker  . Smokeless tobacco: Never Used  Substance and Sexual Activity  . Alcohol use: No  . Drug use: No  . Sexual  activity: Yes    Birth control/protection: None    Comment: married  Lifestyle  . Physical activity:    Days per week: Not on file    Minutes per session: Not on file  . Stress: Not on file  Relationships  . Social connections:    Talks on phone: Not on file    Gets together: Not on file    Attends religious service: Not on file    Active member of club or organization: Not on file    Attends meetings of clubs or organizations: Not on file    Relationship status: Not on file  . Intimate partner violence:    Fear of current or ex partner: Not on file    Emotionally abused: Not on file    Physically abused: Not on file    Forced sexual activity: Not on file  Other Topics Concern  . Not on file  Social History Narrative  . Not on file    FAMILY HISTORY:  Family History  Problem Relation Age of Onset  . Cancer Brother        throat  . Cancer Brother        prostate  . Colon cancer Neg Hx   . Liver disease Neg Hx   . Inflammatory bowel disease Neg Hx     CURRENT MEDICATIONS:  Outpatient Encounter Medications as of 10/13/2017  Medication Sig  . acetaminophen (TYLENOL) 500 MG tablet Take 1,000 mg by mouth every 6 (six) hours as needed.  . capecitabine (XELODA) 500 MG tablet Take 3 tablets (1,500 mg total) by mouth 2 (two) times daily after a meal.  . clonazePAM (KLONOPIN) 0.5 MG tablet Take 0.5 mg by mouth 2 (two) times daily as needed.  . Diphenhyd-Hydrocort-Nystatin (FIRST-DUKES MOUTHWASH) SUSP Use as  directed 5 mLs in the mouth or throat 4 (four) times daily.  Marland Kitchen gabapentin (NEURONTIN) 300 MG capsule Take 3 capsules (900 mg total) by mouth 2 (two) times daily.  Marland Kitchen glimepiride (AMARYL) 2 MG tablet Take 2 mg by mouth daily with breakfast.   . leucovorin in dextrose 5 % 250 mL Inject into the vein once.  Marland Kitchen lisinopril (PRINIVIL,ZESTRIL) 10 MG tablet   . mirtazapine (REMERON) 7.5 MG tablet Take 7.5 mg by mouth at bedtime.  Marland Kitchen omeprazole (PRILOSEC) 20 MG capsule Take 20 mg by mouth 2 (two) times daily before a meal.   . ondansetron (ZOFRAN) 8 MG tablet Take 1 tablet (8 mg total) by mouth 2 (two) times daily as needed for refractory nausea / vomiting. Start on day 3 after chemotherapy.  . prochlorperazine (COMPAZINE) 10 MG tablet Take 1 tablet (10 mg total) by mouth every 6 (six) hours as needed (Nausea or vomiting).  . psyllium (REGULOID) 0.52 g capsule Take 0.52 g by mouth daily.  . rivaroxaban (XARELTO) 20 MG TABS tablet Take 1 tablet (20 mg total) by mouth daily with supper.  . tamsulosin (FLOMAX) 0.4 MG CAPS capsule Take 0.4 mg by mouth at bedtime.   . traZODone (DESYREL) 100 MG tablet Take 100 mg by mouth at bedtime.  . [DISCONTINUED] clonazePAM (KLONOPIN) 1 MG tablet Take 1 tablet (1 mg total) by mouth 2 (two) times daily as needed for anxiety.  . magnesium oxide (MAG-OX) 400 (241.3 Mg) MG tablet Take 1 tablet (400 mg total) by mouth 2 (two) times daily.   Facility-Administered Encounter Medications as of 10/13/2017  Medication  . sodium chloride 0.9 % injection 10 mL  . sodium chloride flush (  NS) 0.9 % injection 10 mL    ALLERGIES:  Allergies  Allergen Reactions  . Oxycodone     Blisters, hallucinations  . Tramadol     Blisters, hallucinations  . Trazodone And Nefazodone Other (See Comments)    hallucinations     PHYSICAL EXAM:  ECOG Performance status: 1  Vitals:   10/13/17 0942  BP: (!) 147/78  Pulse: 83  Resp: 18  Temp: 97.7 F (36.5 C)  SpO2: 100%   Filed Weights    10/13/17 0942  Weight: 210 lb 6.4 oz (95.4 kg)    Physical Exam HEENT: No mucositis or thrush. Chest: Bilaterally clear to auscultation. Cardiovascular: S1-S2 regular rate and rhythm. Extremities: No hand-foot skin reaction.  LABORATORY DATA:  I have reviewed the labs as listed.  CBC    Component Value Date/Time   WBC 6.2 10/13/2017 0848   RBC 4.09 (L) 10/13/2017 0848   HGB 13.2 10/13/2017 0848   HGB 11.6 (L) 02/04/2011 1059   HCT 40.6 10/13/2017 0848   HCT 34.7 (L) 02/04/2011 1059   PLT 178 10/13/2017 0848   PLT 282 02/04/2011 1059   MCV 99.3 10/13/2017 0848   MCV 76.0 (L) 02/04/2011 1059   MCH 32.3 10/13/2017 0848   MCHC 32.5 10/13/2017 0848   RDW 18.3 (H) 10/13/2017 0848   RDW 18.5 (H) 02/04/2011 1059   LYMPHSABS 2.1 10/13/2017 0848   LYMPHSABS 1.1 02/04/2011 1059   MONOABS 0.6 10/13/2017 0848   MONOABS 0.5 02/04/2011 1059   EOSABS 0.2 10/13/2017 0848   EOSABS 0.1 02/04/2011 1059   BASOSABS 0.0 10/13/2017 0848   BASOSABS 0.1 02/04/2011 1059   CMP Latest Ref Rng & Units 10/13/2017 09/22/2017 09/01/2017  Glucose 65 - 99 mg/dL 281(H) 277(H) 269(H)  BUN 6 - 20 mg/dL _0 Creatinine 0.61 - 1.24 mg/dL 0.76 0.83 0.76  Sodium 135 - 145 mmol/L 140 138 138  Potassium 3.5 - 5.1 mmol/L 3.9 4.3 4.3  Chloride 101 - 111 mmol/L 107 108 108  CO2 22 - 32 mmol/L _1 Calcium 8.9 - 10.3 mg/dL 8.9 8.7(L) 8.8(L)  Total Protein 6.5 - 8.1 g/dL 6.3(L) 6.1(L) 6.0(L)  Total Bilirubin 0.3 - 1.2 mg/dL 1.7(H) 0.9 1.1  Alkaline Phos 38 - 126 U/L 79 76 82  AST 15 - 41 U/L _2 ALT 17 - 63 U/L _3 ASSESSMENT & PLAN:   Rectal cancer 1.  Stage IV rectal cancer with lung and pancreatic metastasis: -He was on maintenance 5-FU and bevacizumab.  He started Xeloda first cycle on 08/07/2017 and took 4 pills twice a day.  He is on a 2 weeks on 1 week of regimen.  He felt very tired after starting Xeloda.  He also had flulike symptoms.  His appetite has gone  down. -Cycle 2 Xeloda dose reduced to 1500 mg twice a day, 2 weeks on 1 week off, tolerated better.  Tolerating Avastin better.  He will start his next cycle of Xeloda at the same dose tomorrow.  He will proceed with Avastin today.  We will see him back in 3 weeks for follow-up.  I plan to repeat scans in 6 weeks.  2.  Neuropathy: He has continues numbness and tingling in the hands and feet.  He is taking gabapentin 900 mg twice daily.  He gets pains if he does not take gabapentin.  3.  DVT and PE: This was  diagnosed in 2016 and patient had IVC filter.  He will continue Xarelto 20 mg daily.  4. Rectal pain: He takes hydrocodone 5 mg tablet as needed for rectal pain.  He has not needed any pill in the last 2 weeks.  5.  Hypomagnesemia: His magnesium is 1.5.  I will send a prescription for magnesium twice daily.      Orders placed this encounter:  Orders Placed This Encounter  Procedures  . CBC with Differential ([facility] Only)  . CEA  . CMP ([place] only)      Derek Jack, MD [place] [phone]

## 2017-10-13 NOTE — Progress Notes (Signed)
Patient to treatment room for Avastin and ok to treat per Dr. [name].    Patient tolerated treatment with no complaints voiced.  Good blood return noted before and after administration of Avastin.  No complaints of pain with flush.  Site clean and dry with no bruising or swelling noted at site.  Band aid applied. VSS with discharge and left with wife with no s/s of distress noted.

## 2017-11-02 DIAGNOSIS — E1165 Type 2 diabetes mellitus with hyperglycemia: Secondary | ICD-10-CM | POA: Diagnosis not present

## 2017-11-02 DIAGNOSIS — Z299 Encounter for prophylactic measures, unspecified: Secondary | ICD-10-CM | POA: Diagnosis not present

## 2017-11-02 DIAGNOSIS — Z789 Other specified health status: Secondary | ICD-10-CM | POA: Diagnosis not present

## 2017-11-02 DIAGNOSIS — Z6825 Body mass index (BMI) 25.0-25.9, adult: Secondary | ICD-10-CM | POA: Diagnosis not present

## 2017-11-02 DIAGNOSIS — C7800 Secondary malignant neoplasm of unspecified lung: Secondary | ICD-10-CM | POA: Diagnosis not present

## 2017-11-02 DIAGNOSIS — C20 Malignant neoplasm of rectum: Secondary | ICD-10-CM | POA: Diagnosis not present

## 2017-11-02 DIAGNOSIS — I10 Essential (primary) hypertension: Secondary | ICD-10-CM | POA: Diagnosis not present

## 2017-11-03 ENCOUNTER — Inpatient Hospital Stay (HOSPITAL_COMMUNITY): Payer: Medicare Other

## 2017-11-03 ENCOUNTER — Encounter (HOSPITAL_COMMUNITY): Payer: Self-pay | Admitting: Hematology

## 2017-11-03 ENCOUNTER — Inpatient Hospital Stay (HOSPITAL_BASED_OUTPATIENT_CLINIC_OR_DEPARTMENT_OTHER): Payer: Medicare Other | Admitting: Hematology

## 2017-11-03 ENCOUNTER — Inpatient Hospital Stay (HOSPITAL_COMMUNITY): Payer: Medicare Other | Attending: Hematology

## 2017-11-03 ENCOUNTER — Other Ambulatory Visit: Payer: Self-pay

## 2017-11-03 ENCOUNTER — Other Ambulatory Visit (HOSPITAL_COMMUNITY): Payer: Self-pay | Admitting: *Deleted

## 2017-11-03 VITALS — BP 154/83 | HR 63 | Temp 98.2°F | Resp 18 | Wt 208.8 lb

## 2017-11-03 VITALS — BP 150/66 | HR 64 | Temp 97.8°F | Resp 18

## 2017-11-03 DIAGNOSIS — G629 Polyneuropathy, unspecified: Secondary | ICD-10-CM | POA: Insufficient documentation

## 2017-11-03 DIAGNOSIS — C187 Malignant neoplasm of sigmoid colon: Secondary | ICD-10-CM

## 2017-11-03 DIAGNOSIS — G893 Neoplasm related pain (acute) (chronic): Secondary | ICD-10-CM | POA: Insufficient documentation

## 2017-11-03 DIAGNOSIS — Z86718 Personal history of other venous thrombosis and embolism: Secondary | ICD-10-CM

## 2017-11-03 DIAGNOSIS — C20 Malignant neoplasm of rectum: Secondary | ICD-10-CM

## 2017-11-03 DIAGNOSIS — C7800 Secondary malignant neoplasm of unspecified lung: Secondary | ICD-10-CM | POA: Insufficient documentation

## 2017-11-03 DIAGNOSIS — C7889 Secondary malignant neoplasm of other digestive organs: Secondary | ICD-10-CM

## 2017-11-03 DIAGNOSIS — Z7901 Long term (current) use of anticoagulants: Secondary | ICD-10-CM

## 2017-11-03 DIAGNOSIS — Z79899 Other long term (current) drug therapy: Secondary | ICD-10-CM

## 2017-11-03 DIAGNOSIS — Z5112 Encounter for antineoplastic immunotherapy: Secondary | ICD-10-CM | POA: Diagnosis not present

## 2017-11-03 LAB — URINALYSIS, DIPSTICK ONLY
Bilirubin Urine: NEGATIVE
Glucose, UA: NEGATIVE mg/dL
Hgb urine dipstick: NEGATIVE
Ketones, ur: NEGATIVE mg/dL
Leukocytes, UA: NEGATIVE
Nitrite: NEGATIVE
Specific Gravity, Urine: >1.03 — ABNORMAL HIGH (ref 1.005–1.030)
pH: 6 (ref 5.0–8.0)

## 2017-11-03 LAB — COMPREHENSIVE METABOLIC PANEL
ALT: 21 U/L (ref 0–44)
AST: 22 U/L (ref 15–41)
Albumin: 3.6 g/dL (ref 3.5–5.0)
Alkaline Phosphatase: 67 U/L (ref 38–126)
Anion gap: 6 (ref 5–15)
BUN: 10 mg/dL (ref 8–23)
CO2: 26 mmol/L (ref 22–32)
Calcium: 8.7 mg/dL — ABNORMAL LOW (ref 8.9–10.3)
Chloride: 110 mmol/L (ref 98–111)
Creatinine, Ser: 0.74 mg/dL (ref 0.61–1.24)
GFR calc Af Amer: >60 mL/min (ref >60)
GFR calc non Af Amer: >60 mL/min (ref >60)
Glucose, Bld: 230 mg/dL — ABNORMAL HIGH (ref 70–99)
Potassium: 4.4 mmol/L (ref 3.5–5.1)
Sodium: 142 mmol/L (ref 135–145)
Total Bilirubin: 1.5 mg/dL — ABNORMAL HIGH (ref 0.3–1.2)
Total Protein: 6.2 g/dL — ABNORMAL LOW (ref 6.5–8.1)

## 2017-11-03 LAB — CBC WITH DIFFERENTIAL/PLATELET
Basophils Absolute: 0 10*3/uL (ref 0.0–0.1)
Basophils Relative: 0 %
Eosinophils Absolute: 0.2 10*3/uL (ref 0.0–0.7)
Eosinophils Relative: 4 %
HCT: 39.7 % (ref 39.0–52.0)
Hemoglobin: 13.3 g/dL (ref 13.0–17.0)
Lymphocytes Relative: 33 %
Lymphs Abs: 1.7 10*3/uL (ref 0.7–4.0)
MCH: 33.7 pg (ref 26.0–34.0)
MCHC: 33.5 g/dL (ref 30.0–36.0)
MCV: 100.5 fL — ABNORMAL HIGH (ref 78.0–100.0)
Monocytes Absolute: 0.5 10*3/uL (ref 0.1–1.0)
Monocytes Relative: 9 %
Neutro Abs: 2.8 10*3/uL (ref 1.7–7.7)
Neutrophils Relative %: 54 %
Platelets: 196 10*3/uL (ref 150–400)
RBC: 3.95 MIL/uL — ABNORMAL LOW (ref 4.22–5.81)
RDW: 17.9 % — ABNORMAL HIGH (ref 11.5–15.5)
WBC: 5.2 10*3/uL (ref 4.0–10.5)

## 2017-11-03 LAB — MAGNESIUM: Magnesium: 1.8 mg/dL (ref 1.7–2.4)

## 2017-11-03 MED ORDER — SODIUM CHLORIDE 0.9% FLUSH
10.0000 mL | INTRAVENOUS | Status: DC | PRN
  Administered 2017-11-03: 10 mL
  Filled 2017-11-03: qty 10

## 2017-11-03 MED ORDER — SODIUM CHLORIDE 0.9 % IV SOLN
Freq: Once | INTRAVENOUS | Status: AC
  Administered 2017-11-03: 11:00:00 via INTRAVENOUS

## 2017-11-03 MED ORDER — SODIUM CHLORIDE 0.9 % IV SOLN
700.0000 mg | Freq: Once | INTRAVENOUS | Status: AC
  Administered 2017-11-03: 700 mg via INTRAVENOUS
  Filled 2017-11-03: qty 16

## 2017-11-03 MED ORDER — CAPECITABINE 500 MG PO TABS: ORAL_TABLET | ORAL | 1 refills | Status: DC

## 2017-11-03 MED ORDER — HEPARIN SOD (PORK) LOCK FLUSH 100 UNIT/ML IV SOLN
500.0000 [IU] | Freq: Once | INTRAVENOUS | Status: AC | PRN
  Administered 2017-11-03: 500 [IU]

## 2017-11-03 MED FILL — XELODA 500 MG TABLET: 500 | 21 days supply | Qty: 84 | Fill #0

## 2017-11-03 NOTE — Patient Instructions (Signed)
[facility] Discharge Instructions for Patients Receiving Chemotherapy  Today you received the following chemotherapy agents avastin.    If you develop nausea and vomiting that is not controlled by your nausea medication, call the clinic.   BELOW ARE SYMPTOMS THAT SHOULD BE REPORTED IMMEDIATELY:  *FEVER GREATER THAN 100.5 F  *CHILLS WITH OR WITHOUT FEVER  NAUSEA AND VOMITING THAT IS NOT CONTROLLED WITH YOUR NAUSEA MEDICATION  *UNUSUAL SHORTNESS OF BREATH  *UNUSUAL BRUISING OR BLEEDING  TENDERNESS IN MOUTH AND THROAT WITH OR WITHOUT PRESENCE OF ULCERS  *URINARY PROBLEMS  *BOWEL PROBLEMS  UNUSUAL RASH Items with * indicate a potential emergency and should be followed up as soon as possible.  Feel free to call the clinic should you have any questions or concerns. The clinic phone number is [phone].  Please show the [facility] at check-in to the Emergency Department and triage nurse.

## 2017-11-03 NOTE — Progress Notes (Unsigned)
Xeloda refilled today and sent via escript to [facility]. Drug to be delivered to [facility] pharmacy 11/04/17 am. Patient aware.

## 2017-11-03 NOTE — Assessment & Plan Note (Signed)
1.  Stage IV rectal cancer with lung and pancreatic metastasis: -He was on maintenance 5-FU and bevacizumab.  He started Xeloda first cycle on 08/07/2017 and took 4 pills twice a day.  He is on a 2 weeks on 1 week of regimen.  He felt very tired after starting Xeloda.  He also had flulike symptoms.  His appetite has gone down. -Cycle 2 Xeloda dose reduced to 1500 mg twice a day, 2 weeks on 1 week off, tolerated better.  Tolerating Avastin better.  He will start his next cycle of Xeloda at the same dose tomorrow.  He will proceed with Avastin today.  He will come back in 3 weeks with repeat CT scan of the chest, abdomen and pelvis.  2.  Neuropathy: He has continues numbness and tingling in the hands and feet.  He is taking gabapentin 900 mg twice daily.  He gets pains if he does not take gabapentin.  3.  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  He will continue Xarelto 20 mg daily.  4. Rectal pain: He takes hydrocodone 5 mg tablet as needed for rectal pain.  He has not needed any pill in the last few weeks.  5.  Hypomagnesemia: His magnesium is normal today.  He is continuing magnesium 1 tablet twice daily.  However he has developed diarrhea.  I have asked him to take Imodium.

## 2017-11-03 NOTE — Progress Notes (Signed)
[place], [place] [address]   CLINIC:  Medical Oncology/Hematology  PCP:  [name], MD [place] [address] [phone]   REASON FOR VISIT:  Follow-up for metastatic rectal cancer.  CURRENT THERAPY: Xeloda and Bevacizumab       BRIEF ONCOLOGIC HISTORY:    Rectal cancer ([place])   07/24/2010 Initial Diagnosis    Invasive adenocarcinoma of rectum      09/09/2010 Concurrent Chemotherapy    S/P radiation and concurrent 5-FU continuous infusion from 09/09/10- 10/10/10.      11/14/2010 Surgery    S/P proctectomy with colorectal anastomosis and diverting loop ileostomy on 11/14/10 at [facility] by Dr. Harlon Ditty. Pathology reveals a pT3b N1 with 3/20 lymph nodes.      02/05/2011 - 07/14/2011 Chemotherapy    FOLFOX      08/18/2011 Surgery    Approximate date of surgery- [place] by Dr. Harlon Ditty       Remission         11/24/2013 Survivorship    Colonoscopy- somewhat fibrotic/friable anastomotic mucosal-status post biopsy (narrowing not felt to be clinically significant).  Negative pathology for malignancy      09/12/2014 Imaging    DVT in the left femoral venous system, left common iliac vein, IVC, and within the IVC filter Right lower extremity venography confirms chronic occlusion of the femoral venous system with collateralization. The right iliac venous system is patent and do      09/25/2014 PET scan    The right middle lobe pulmonary nodule is hypermetabolic, favored to represent a primary bronchogenic carcinoma.Equivocal mediastinal nodes, similar to surrounding blood pool. Bilateral adrenal hypermetabolism, felt to be physiologic      10/24/2014 Pathology Results    Lung, needle/core biopsy(ies), RML - ADENOCARCINOMA, SEE COMMENT metastatic adenocarcinoma of a colorectal primary      10/24/2014 Relapse/Recurrence         11/16/2014 Definitive Surgery    Bronchoscopy, right video-assisted thoracoscopy, wedge  resection of right middle lobe by Dr. Servando Snare      11/16/2014 Pathology Results    Lung, wedge biopsy/resection, right lingula and small portion of middle lobe - METASTATIC ADENOCARCINOMA, CONSISTENT WITH COLORECTAL PRIMARY, SPANNING 2.0 CM. - THE SURGICAL RESECTION MARGINS ARE NEGATIVE FOR ADENOCARCINOMA.      11/16/2014 Remission    THE SURGICAL RESECTION MARGINS ARE NEGATIVE FOR ADENOCARCINOMA.      03/07/2015 Imaging    CT CAP- Interval resection of right middle lobe metastasis. No acute process or evidence of metastatic disease in the chest, abdomen or pelvis. Improved right upper lobe reticular nodular opacities are favored to represent resolving infection.      09/14/2015 Imaging    CT CAP- No findings of recurrent malignancy. No recurrence along the wedge resection site of the right middle lobe. Right anterior abdominal wall focal hernia containing a knuckle of small bowel without complicating feature.      06/13/2016 Imaging    CT CAP- 1. New right-sided pleural metastasis/mass. Other smaller right-sided pulmonary nodules which are all pleural-based and most likely represent pleural metastasis. 2. No convincing evidence of abdominopelvic nodal metastasis. 3. Constellation of findings, including pancreatic atrophy, duct dilatation, and pancreatic head soft tissue fullness which are highly suspicious for pancreatic adenocarcinoma. Metastatic disease felt much less likely. Consider endoscopic ultrasound sampling or ERCP. Cannot exclude superimposed acute pancreatitis. 4. New enlargement of the appendiceal tip with subtle surrounding edema. Cannot exclude early or  mild appendicitis. 5. Coronary artery atherosclerosis. Aortic atherosclerosis. 6. 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      06/13/2016 Progression    CT scan demonstrates progression of disease      06/19/2016 Procedure    EUS with FNA by Dr. Ardis Hughs      06/20/2016 Pathology Results     FINE NEEDLE ASPIRATION, ENDOSCOPIC, PANCREAS UNCINATE AREA(SPECIMEN 1 OF 1 COLLECTED 06/19/16): MALIGNANT CELLS CONSISTENT WITH METASTATIC ADENOCARCINOMA.      06/25/2016 PET scan    1. Two pleural-based nodules in the right hemithorax are hypermetabolic. Metastatic disease is a distinct consideration. The scattered pulmonary parenchymal nodule seen on previous diagnostic CT imaging are below the threshold for reliable resolution on PET imaging. 2. Hypermetabolic lesion pancreatic head, consistent with neoplasm. As noted on prior CT, adenocarcinoma is any consideration. No evidence for hypermetabolic abdominal lymphadenopathy. Mottled uptake noted in the liver, but no discrete hepatic metastases are evident on PET imaging. 3. Appendix remains distended up to 0.9-10 mm diameter with a stone towards the tip in subtle periappendiceal edema/inflammation. The appendix is hypermetabolic along its length. Imaging features are relatively stable in the 12 day interval since prior CT scan. While appendicitis is a consideration, the relative stability over 12 days would be unusual for that etiology. Continued close follow-up recommended.      06/26/2016 Pathology Results    Not enough tissue for foundationONE or K-ras testing.      07/07/2016 Procedure    Port placed by Dr. Arnoldo Morale      07/08/2016 -  Chemotherapy    The patient had pegfilgrastim (NEULASTA) injection 6 mg, 6 mg, Subcutaneous,  Once, 0 of 4 cycles  ondansetron (ZOFRAN) IVPB 8 mg, 8 mg, Intravenous,  Once, 0 of 4 cycles  leucovorin 804 mg in dextrose 5 % 250 mL infusion, 400 mg/m2, Intravenous,  Once, 0 of 4 cycles  oxaliplatin (ELOXATIN) 170 mg in dextrose 5 % 500 mL chemo infusion, 85 mg/m2, Intravenous,  Once, 0 of 4 cycles  fluorouracil (ADRUCIL) 4,800 mg in sodium chloride 0.9 % 150 mL chemo infusion, 2,400 mg/m2 = 4,800 mg, Intravenous, 1 Day/Dose, 0 of 4 cycles  fluorouracil (ADRUCIL) chemo injection 800 mg, 400 mg/m2,  Intravenous,  Once, 0 of 4 cycles  pegfilgrastim (NEULASTA) injection 6 mg, 6 mg, Subcutaneous,  Once, 2 of 2 cycles  ondansetron (ZOFRAN) IVPB 8 mg, 8 mg, Intravenous,  Once, 12 of 12 cycles  leucovorin 660 mg in dextrose 5 % 250 mL infusion, 660 mg (original dose ), Intravenous,  Once, 12 of 12 cycles Dose modification: 660 mg (Cycle 1)  oxaliplatin (ELOXATIN) 140 mg in dextrose 5 % 500 mL chemo infusion, 140 mg (original dose ), Intravenous,  Once, 12 of 12 cycles Dose modification: 140 mg (Cycle 1), 119 mg (85 % of original dose 140 mg, Cycle 8, Reason: Provider Judgment)  fluorouracil (ADRUCIL) 3,850 mg in sodium chloride 0.9 % 150 mL chemo infusion, 3,850 mg (original dose ), Intravenous, 1 Day/Dose, 12 of 12 cycles Dose modification: 3,850 mg (Cycle 1)  fluorouracil (ADRUCIL) chemo injection 700 mg, 700 mg (original dose ), Intravenous,  Once, 12 of 12 cycles Dose modification: 700 mg (Cycle 1)  palonosetron (ALOXI) injection 0.25 mg, 0.25 mg, Intravenous,  Once, 7 of 12 cycles Administration: 0.25 mg (09/30/2016)  bevacizumab (AVASTIN) 475 mg in sodium chloride 0.9 % 100 mL chemo infusion, 5 mg/kg = 475 mg, Intravenous,  Once, 6 of 11 cycles Administration: 500  mg (09/30/2016)  leucovorin 880 mg in dextrose 5 % 250 mL infusion, 400 mg/m2 = 880 mg, Intravenous,  Once, 7 of 12 cycles Administration: 880 mg (09/30/2016)  oxaliplatin (ELOXATIN) 185 mg in dextrose 5 % 500 mL chemo infusion, 85 mg/m2 = 185 mg, Intravenous,  Once, 7 of 12 cycles Administration: 185 mg (09/30/2016)  fluorouracil (ADRUCIL) chemo injection 900 mg, 400 mg/m2 = 900 mg, Intravenous,  Once, 7 of 12 cycles Administration: 900 mg (09/30/2016)  fluorouracil (ADRUCIL) 5,300 mg in sodium chloride 0.9 % 144 mL chemo infusion, 2,400 mg/m2 = 5,300 mg, Intravenous, 1 Day/Dose, 7 of 12 cycles Administration: 5,300 mg (09/30/2016)  for chemotherapy treatment.        08/27/2016 Imaging    CT CAP- 1. Pulmonary  metastatic disease is stable. 2. Pancreatic head mass, grossly stable, with progressive atrophy of the body and tail of the pancreas. Stable peripancreatic lymph node. 3. Mild circumferential rectal wall thickening, stable. 4. Aortic atherosclerosis (ICD10-170.0). Coronary artery calcification. 5. Hyperattenuating lesion off the lower pole left kidney, stable, too small to characterize. Continued attention on followup exams is warranted. 6. Persistent wall thickening and mild dilatation of the proximal appendix, of uncertain etiology. Continued attention on followup exams is warranted.      11/06/2016 Imaging    CT C/A/P: IMPRESSION: 1. Stable pulmonary metastatic disease. 2. Similar appearing pancreatic head mass. Progressed atrophy of the pancreatic body and tail. Stable peripancreatic lymph node. 3. Stable hypoattenuating lesion partially exophytic off the inferior pole of the left kidney. 4. Aortic atherosclerosis. 5. Persistent mild wall thickening and mild dilatation of the appendix.       01/30/2017 Imaging    Ct C/A/P: IMPRESSION: 1. Stable pulmonary metastatic disease. 2. Stable to slight increase in size of pancreatic head mass. 3.  Aortic Atherosclerosis (ICD10-I70.0). 4. Similar appearance of mild wall thickening of the appendix which contains an appendicolith.      05/01/2017 Imaging    CT C/A/P: Stable disease in the lungs, no progression of disease in the abdomen.        CANCER STAGING: Cancer Staging Rectal cancer ([place]) Staging form: Colon and Rectum, AJCC 7th Edition - Clinical: Stage IIIB (T3, N1, M0) - Signed by [name], MD on 02/04/2011 - Pathologic stage from 11/17/2014: Stage IVA (M1a) - Signed by Baird Cancer, PA-C on 09/14/2015    INTERVAL HISTORY:  Charles [name] 67 y.o. male returns for routine follow-up for metastatic rectal cancer and his Bevacizumab infusion today. Patient is taking Xeloda tablets at home. He is tolerating  the medicine well. He complains of diarrhea 12 times a day. He has a light rash on his upper arms and his feet and hand itch. He denies and sores on feet or hands. Denies any new pain, nausea, or vomiting. Patient is able to all his household activities with no problems. Overall, he tells me he has been feeling pretty well. Energy levels 75%; appetite 75%. Overall, he feels ready for next cycle of chemo today.      REVIEW OF SYSTEMS:  Review of Systems  Constitutional: Positive for fatigue.  Gastrointestinal: Positive for diarrhea.  Skin: Positive for itching (feet and hands) and rash (upper arms only).  All other systems reviewed and are negative.    PAST MEDICAL/SURGICAL HISTORY:  Past Medical History:  Diagnosis Date  . Chronic anticoagulation   . Colon cancer ([place]) 07/24/2010   rectal ca, inv adenocarcinoma  . Depression 04/01/2011  . Diabetes mellitus without complication (  [place])   . DVT (deep venous thrombosis) ([place]) 05/09/2011  . GERD (gastroesophageal reflux disease)   . High output ileostomy ([place]) 05/21/2011  . History of kidney stones   . HTN (hypertension)   . Hx of radiation therapy 09/02/10 to 10/14/10   pelvis  . Lung metastasis ([place])   . Neuropathy   . Peripheral vascular disease ([place])    dvt's,pe  . Pneumonia    hx x3  . Pulmonary embolism ([place])   . Rectal cancer ([place]) 01/15/2011   S/P radiation and concurrent 5-FU continuous infusion from 09/09/10- 10/10/10.  S/P proctectomy with colorectal anastomosis and diverting loop ileostomy on 11/14/10 at [facility] by Dr. Harlon Ditty. Pathology reveals a pT3b N1 with 3/20 lymph nodes.     Past Surgical History:  Procedure Laterality Date  . COLON SURGERY  11/14/2010   proctectomy with colorectal anastomosis and diverting loop ileostomy (temporary planned)  . COLONOSCOPY  07/2010   proximal rectal apple core mass 10-14cm from anal verge (adenocarcinoma), 2-3cm distal rectal carpet polyp s/p piecemeal snare polypectomy (adenoma)    . COLONOSCOPY  04/21/2012   RMR: Friable,fibrotic appearing colorectal anastomosis producing some luminal narrowing-not felt to be critical. path: focal erosion with slight inflammation and hyperemia. SURVEILLANCE DUE DEC 2015  . COLONOSCOPY N/A 11/24/2013   Dr. Rourk:somewhat fibrotic/friable anastomotic mucosal-status post biopsy (narrowing not felt to be clinically significant) Single colonic diverticulum. benign polypoid rectal mucosa  . colostomy reversal  april 2013  . ESOPHAGOGASTRODUODENOSCOPY  07/2010   RMR: schatki ring s/p dilation, small hh, SB bx benign  . ESOPHAGOGASTRODUODENOSCOPY N/A 09/04/2015   Procedure: ESOPHAGOGASTRODUODENOSCOPY (EGD);  Surgeon: Daneil Dolin, MD;  Location: [facility] ENDO SUITE;  Service: Endoscopy;  Laterality: N/A;  730  . EUS  08/2010   Dr. Owens Loffler. uT3N0 circumferential, nearly obstruction rectosigmoid adenocarcinoma, distal edge 12cm from anal verge  . EUS N/A 06/19/2016   Procedure: UPPER ENDOSCOPIC ULTRASOUND (EUS) RADIAL;  Surgeon: Milus Banister, MD;  Location: [facility] ENDOSCOPY;  Service: Endoscopy;  Laterality: N/A;  . HERNIA REPAIR     abd hernia repair  . IVC filter    . ivc filter    . port a cath placement    . PORT-A-CATH REMOVAL  09/24/2011   Procedure: REMOVAL PORT-A-CATH;  Surgeon: Donato Heinz, MD;  Location: [facility] ORS;  Service: General;  Laterality: N/A;  Minor Room  . PORTACATH PLACEMENT Right 07/07/2016   Procedure: INSERTION PORT-A-CATH;  Surgeon: Aviva Signs, MD;  Location: [facility] ORS;  Service: General;  Laterality: Right;  Marland Kitchen VIDEO ASSISTED THORACOSCOPY (VATS)/WEDGE RESECTION Right 11/15/2014   Procedure: VIDEO ASSISTED THORACOSCOPY (VATS)/LUNG RESECTION WITH RIGHT LINGULECTOMY;  Surgeon: [name], MD;  Location: [place];  Service: Thoracic;  Laterality: Right;  Marland Kitchen VIDEO BRONCHOSCOPY N/A 11/15/2014   Procedure: VIDEO BRONCHOSCOPY;  Surgeon: [name], MD;  Location[facility];  Service: Thoracic;  Laterality: N/A;     SOCIAL  HISTORY:  Social History   Socioeconomic History  . Marital status: Married    Spouse name: Not on file  . Number of children: 2  . Years of education: Not on file  . Highest education level: Not on file  Occupational History  . Occupation: self-employed Regulatory affairs officer, currently not working    Fish farm manager: SELF EMPLOYED  Social Needs  . Financial resource strain: Not on file  . Food insecurity:    Worry: Not on file    Inability: Not on file  . Transportation needs:  Medical: Not on file    Non-medical: Not on file  Tobacco Use  . Smoking status: Never Smoker  . Smokeless tobacco: Never Used  Substance and Sexual Activity  . Alcohol use: No  . Drug use: No  . Sexual activity: Yes    Birth control/protection: None    Comment: married  Lifestyle  . Physical activity:    Days per week: Not on file    Minutes per session: Not on file  . Stress: Not on file  Relationships  . Social connections:    Talks on phone: Not on file    Gets together: Not on file    Attends religious service: Not on file    Active member of club or organization: Not on file    Attends meetings of clubs or organizations: Not on file    Relationship status: Not on file  . Intimate partner violence:    Fear of current or ex partner: Not on file    Emotionally abused: Not on file    Physically abused: Not on file    Forced sexual activity: Not on file  Other Topics Concern  . Not on file  Social History Narrative  . Not on file    FAMILY HISTORY:  Family History  Problem Relation Age of Onset  . Cancer Brother        throat  . Cancer Brother        prostate  . Colon cancer Neg Hx   . Liver disease Neg Hx   . Inflammatory bowel disease Neg Hx     CURRENT MEDICATIONS:  Outpatient Encounter Medications as of 11/03/2017  Medication Sig  . acetaminophen (TYLENOL) 500 MG tablet Take 1,000 mg by mouth every 6 (six) hours as needed.  . clonazePAM (KLONOPIN) 0.5 MG tablet Take  0.5 mg by mouth 2 (two) times daily as needed.  . Diphenhyd-Hydrocort-Nystatin (FIRST-DUKES MOUTHWASH) SUSP Use as directed 5 mLs in the mouth or throat 4 (four) times daily.  Marland Kitchen gabapentin (NEURONTIN) 300 MG capsule Take 3 capsules (900 mg total) by mouth 2 (two) times daily.  Marland Kitchen glimepiride (AMARYL) 2 MG tablet Take 2 mg by mouth daily with breakfast.   . leucovorin in dextrose 5 % 250 mL Inject into the vein once.  Marland Kitchen lisinopril (PRINIVIL,ZESTRIL) 10 MG tablet   . magnesium oxide (MAG-OX) 400 (241.3 Mg) MG tablet Take 1 tablet (400 mg total) by mouth 2 (two) times daily.  . mirtazapine (REMERON) 7.5 MG tablet Take 7.5 mg by mouth at bedtime.  Marland Kitchen omeprazole (PRILOSEC) 20 MG capsule Take 20 mg by mouth 2 (two) times daily before a meal.   . ondansetron (ZOFRAN) 8 MG tablet Take 1 tablet (8 mg total) by mouth 2 (two) times daily as needed for refractory nausea / vomiting. Start on day 3 after chemotherapy.  . prochlorperazine (COMPAZINE) 10 MG tablet Take 1 tablet (10 mg total) by mouth every 6 (six) hours as needed (Nausea or vomiting).  . psyllium (REGULOID) 0.52 g capsule Take 0.52 g by mouth daily.  . rivaroxaban (XARELTO) 20 MG TABS tablet Take 1 tablet (20 mg total) by mouth daily with supper.  . tamsulosin (FLOMAX) 0.4 MG CAPS capsule Take 0.4 mg by mouth at bedtime.   . traZODone (DESYREL) 100 MG tablet Take 100 mg by mouth at bedtime.  . [DISCONTINUED] capecitabine (XELODA) 500 MG tablet Take 3 tablets (1,500 mg total) by mouth 2 (two) times daily after a meal.   Facility-Administered  Encounter Medications as of 11/03/2017  Medication  . sodium chloride 0.9 % injection 10 mL  . sodium chloride flush (NS) 0.9 % injection 10 mL    ALLERGIES:  Allergies  Allergen Reactions  . Oxycodone     Blisters, hallucinations  . Tramadol     Blisters, hallucinations  . Trazodone And Nefazodone Other (See Comments)    hallucinations     PHYSICAL EXAM:  ECOG Performance status: 1  Vitals:    11/03/17 0917  BP: (!) 154/83  Pulse: 63  Resp: 18  Temp: 98.2 F (36.8 C)  SpO2: 100%   Filed Weights   11/03/17 0917  Weight: 208 lb 12.8 oz (94.7 kg)    Physical Exam   LABORATORY DATA:  I have reviewed the labs as listed.  CBC    Component Value Date/Time   WBC 5.2 11/03/2017 0829   RBC 3.95 (L) 11/03/2017 0829   HGB 13.3 11/03/2017 0829   HGB 11.6 (L) 02/04/2011 1059   HCT 39.7 11/03/2017 0829   HCT 34.7 (L) 02/04/2011 1059   PLT 196 11/03/2017 0829   PLT 282 02/04/2011 1059   MCV 100.5 (H) 11/03/2017 0829   MCV 76.0 (L) 02/04/2011 1059   MCH 33.7 11/03/2017 0829   MCHC 33.5 11/03/2017 0829   RDW 17.9 (H) 11/03/2017 0829   RDW 18.5 (H) 02/04/2011 1059   LYMPHSABS 1.7 11/03/2017 0829   LYMPHSABS 1.1 02/04/2011 1059   MONOABS 0.5 11/03/2017 0829   MONOABS 0.5 02/04/2011 1059   EOSABS 0.2 11/03/2017 0829   EOSABS 0.1 02/04/2011 1059   BASOSABS 0.0 11/03/2017 0829   BASOSABS 0.1 02/04/2011 1059   CMP Latest Ref Rng & Units 11/03/2017 10/13/2017 09/22/2017  Glucose 70 - 99 mg/dL 230(H) 281(H) 277(H)  BUN 8 - 23 mg/dL '10 14 14  ' Creatinine 0.61 - 1.24 mg/dL 0.74 0.76 0.83  Sodium 135 - 145 mmol/L 142 140 138  Potassium 3.5 - 5.1 mmol/L 4.4 3.9 4.3  Chloride 98 - 111 mmol/L 110 107 108  CO2 22 - 32 mmol/L '26 26 24  ' Calcium 8.9 - 10.3 mg/dL 8.7(L) 8.9 8.7(L)  Total Protein 6.5 - 8.1 g/dL 6.2(L) 6.3(L) 6.1(L)  Total Bilirubin 0.3 - 1.2 mg/dL 1.5(H) 1.7(H) 0.9  Alkaline Phos 38 - 126 U/L 67 79 76  AST 15 - 41 U/L '22 24 24  ' ALT 0 - 44 U/L '21 23 22         ' ASSESSMENT & PLAN:   Rectal cancer 1.  Stage IV rectal cancer with lung and pancreatic metastasis: -He was on maintenance 5-FU and bevacizumab.  He started Xeloda first cycle on 08/07/2017 and took 4 pills twice a day.  He is on a 2 weeks on 1 week of regimen.  He felt very tired after starting Xeloda.  He also had flulike symptoms.  His appetite has gone down. -Cycle 2 Xeloda dose reduced to 1500 mg twice a  day, 2 weeks on 1 week off, tolerated better.  Tolerating Avastin better.  He will start his next cycle of Xeloda at the same dose tomorrow.  He will proceed with Avastin today.  He will come back in 3 weeks with repeat CT scan of the chest, abdomen and pelvis.  2.  Neuropathy: He has continues numbness and tingling in the hands and feet.  He is taking gabapentin 900 mg twice daily.  He gets pains if he does not take gabapentin.  3.  DVT and PE: This was diagnosed  in 2016 and patient had IVC filter.  He will continue Xarelto 20 mg daily.  4. Rectal pain: He takes hydrocodone 5 mg tablet as needed for rectal pain.  He has not needed any pill in the last few weeks.  5.  Hypomagnesemia: His magnesium is normal today.  He is continuing magnesium 1 tablet twice daily.  However he has developed diarrhea.  I have asked him to take Imodium.      Orders placed this encounter:  Orders Placed This Encounter  Procedures  . CT Abdomen Pelvis W Contrast  . CT Chest W Contrast  . CBC with Differential  . CBC with Differential/Platelet  . Comprehensive metabolic panel  . CEA      Derek Jack, MD [place] [phone]

## 2017-11-03 NOTE — Progress Notes (Signed)
To treatment room after oncology follow up visit.  Patient stated small amount of diarrhea after meals and will start using imodium as directed by Dr. [name].  No changes in neuropathy.  Denied trips, stumbles, or falls.  No change in fingers.  Continues to do ADL's without difficulty.  Family at site.    Patient tolerated chemotherapy with no complaints voiced.  Port site clean and dry with no bruising or swelling noted at site.  Good blood return noted before and after administration of therapy.  Band aid applied.  VSs with discharge and left ambulatory with no s/s of distress noted.

## 2017-11-04 LAB — CEA: CEA: 4.2 ng/mL (ref 0.0–4.7)

## 2017-11-17 ENCOUNTER — Ambulatory Visit: Payer: Medicare Other | Admitting: Internal Medicine

## 2017-11-23 ENCOUNTER — Ambulatory Visit (HOSPITAL_COMMUNITY)
Admission: RE | Admit: 2017-11-23 | Discharge: 2017-11-23 | Disposition: A | Discharge status: Home / Self Care | Payer: Medicare Other | Source: Ambulatory Visit | Attending: Nurse Practitioner | Admitting: Nurse Practitioner

## 2017-11-23 DIAGNOSIS — R918 Other nonspecific abnormal finding of lung field: Secondary | ICD-10-CM | POA: Insufficient documentation

## 2017-11-23 DIAGNOSIS — C7800 Secondary malignant neoplasm of unspecified lung: Secondary | ICD-10-CM | POA: Diagnosis not present

## 2017-11-23 DIAGNOSIS — C20 Malignant neoplasm of rectum: Secondary | ICD-10-CM | POA: Diagnosis not present

## 2017-11-23 DIAGNOSIS — I700 Atherosclerosis of aorta: Secondary | ICD-10-CM | POA: Diagnosis not present

## 2017-11-23 DIAGNOSIS — C218 Malignant neoplasm of overlapping sites of rectum, anus and anal canal: Secondary | ICD-10-CM | POA: Diagnosis not present

## 2017-11-23 DIAGNOSIS — K760 Fatty (change of) liver, not elsewhere classified: Secondary | ICD-10-CM | POA: Diagnosis not present

## 2017-11-23 MED ORDER — IOPAMIDOL (ISOVUE-300) INJECTION 61%
100.0000 mL | Freq: Once | INTRAVENOUS | Status: AC | PRN
  Administered 2017-11-23: 100 mL via INTRAVENOUS

## 2017-11-23 MED FILL — XELODA 500 MG TABLET: 500 | 21 days supply | Qty: 84 | Fill #1

## 2017-11-24 ENCOUNTER — Other Ambulatory Visit: Payer: Self-pay

## 2017-11-24 ENCOUNTER — Inpatient Hospital Stay (HOSPITAL_COMMUNITY): Payer: Medicare Other

## 2017-11-24 ENCOUNTER — Encounter (HOSPITAL_COMMUNITY): Payer: Self-pay | Admitting: Hematology

## 2017-11-24 ENCOUNTER — Inpatient Hospital Stay (HOSPITAL_BASED_OUTPATIENT_CLINIC_OR_DEPARTMENT_OTHER): Payer: Medicare Other | Admitting: Hematology

## 2017-11-24 VITALS — BP 146/86 | HR 95 | Temp 97.7°F | Resp 18 | Wt 209.3 lb

## 2017-11-24 VITALS — BP 134/77 | HR 83 | Temp 97.6°F | Resp 18 | Wt 208.3 lb

## 2017-11-24 DIAGNOSIS — C187 Malignant neoplasm of sigmoid colon: Secondary | ICD-10-CM

## 2017-11-24 DIAGNOSIS — C20 Malignant neoplasm of rectum: Secondary | ICD-10-CM

## 2017-11-24 DIAGNOSIS — G629 Polyneuropathy, unspecified: Secondary | ICD-10-CM | POA: Diagnosis not present

## 2017-11-24 DIAGNOSIS — G893 Neoplasm related pain (acute) (chronic): Secondary | ICD-10-CM | POA: Diagnosis not present

## 2017-11-24 DIAGNOSIS — Z86718 Personal history of other venous thrombosis and embolism: Secondary | ICD-10-CM | POA: Diagnosis not present

## 2017-11-24 DIAGNOSIS — Z79899 Other long term (current) drug therapy: Secondary | ICD-10-CM

## 2017-11-24 DIAGNOSIS — C7889 Secondary malignant neoplasm of other digestive organs: Secondary | ICD-10-CM

## 2017-11-24 DIAGNOSIS — C7800 Secondary malignant neoplasm of unspecified lung: Secondary | ICD-10-CM

## 2017-11-24 DIAGNOSIS — Z5112 Encounter for antineoplastic immunotherapy: Secondary | ICD-10-CM | POA: Diagnosis not present

## 2017-11-24 DIAGNOSIS — Z7901 Long term (current) use of anticoagulants: Secondary | ICD-10-CM | POA: Diagnosis not present

## 2017-11-24 LAB — COMPREHENSIVE METABOLIC PANEL
ALT: 20 U/L (ref 0–44)
AST: 23 U/L (ref 15–41)
Albumin: 3.5 g/dL (ref 3.5–5.0)
Alkaline Phosphatase: 87 U/L (ref 38–126)
Anion gap: 6 (ref 5–15)
BUN: 12 mg/dL (ref 8–23)
CO2: 24 mmol/L (ref 22–32)
Calcium: 8.9 mg/dL (ref 8.9–10.3)
Chloride: 109 mmol/L (ref 98–111)
Creatinine, Ser: 0.7 mg/dL (ref 0.61–1.24)
GFR calc Af Amer: >60 mL/min (ref >60)
GFR calc non Af Amer: >60 mL/min (ref >60)
Glucose, Bld: 238 mg/dL — ABNORMAL HIGH (ref 70–99)
Potassium: 4.3 mmol/L (ref 3.5–5.1)
Sodium: 139 mmol/L (ref 135–145)
Total Bilirubin: 0.9 mg/dL (ref 0.3–1.2)
Total Protein: 6 g/dL — ABNORMAL LOW (ref 6.5–8.1)

## 2017-11-24 LAB — CBC WITH DIFFERENTIAL/PLATELET
Basophils Absolute: 0 10*3/uL (ref 0.0–0.1)
Basophils Relative: 0 %
Eosinophils Absolute: 0.2 10*3/uL (ref 0.0–0.7)
Eosinophils Relative: 4 %
HCT: 40.8 % (ref 39.0–52.0)
Hemoglobin: 13.7 g/dL (ref 13.0–17.0)
Lymphocytes Relative: 29 %
Lymphs Abs: 1.8 10*3/uL (ref 0.7–4.0)
MCH: 33.7 pg (ref 26.0–34.0)
MCHC: 33.6 g/dL (ref 30.0–36.0)
MCV: 100.5 fL — ABNORMAL HIGH (ref 78.0–100.0)
Monocytes Absolute: 0.5 10*3/uL (ref 0.1–1.0)
Monocytes Relative: 8 %
Neutro Abs: 3.7 10*3/uL (ref 1.7–7.7)
Neutrophils Relative %: 59 %
Platelets: 186 10*3/uL (ref 150–400)
RBC: 4.06 MIL/uL — ABNORMAL LOW (ref 4.22–5.81)
RDW: 17.1 % — ABNORMAL HIGH (ref 11.5–15.5)
WBC: 6.2 10*3/uL (ref 4.0–10.5)

## 2017-11-24 LAB — URINALYSIS, DIPSTICK ONLY
Bilirubin Urine: NEGATIVE
Glucose, UA: NEGATIVE mg/dL
Hgb urine dipstick: NEGATIVE
Ketones, ur: NEGATIVE mg/dL
Leukocytes, UA: NEGATIVE
Nitrite: NEGATIVE
Protein, ur: NEGATIVE mg/dL
Specific Gravity, Urine: 1.018 (ref 1.005–1.030)
pH: 5 (ref 5.0–8.0)

## 2017-11-24 MED ORDER — SODIUM CHLORIDE 0.9 % IV SOLN
Freq: Once | INTRAVENOUS | Status: AC
  Administered 2017-11-24: 11:00:00 via INTRAVENOUS

## 2017-11-24 MED ORDER — HEPARIN SOD (PORK) LOCK FLUSH 100 UNIT/ML IV SOLN
500.0000 [IU] | Freq: Once | INTRAVENOUS | Status: AC | PRN
  Administered 2017-11-24: 500 [IU]
  Filled 2017-11-24: qty 5

## 2017-11-24 MED ORDER — BEVACIZUMAB CHEMO INJECTION 400 MG/16ML
700.0000 mg | Freq: Once | INTRAVENOUS | Status: AC
  Administered 2017-11-24: 700 mg via INTRAVENOUS
  Filled 2017-11-24: qty 16

## 2017-11-24 NOTE — Progress Notes (Signed)
[name], [name] [id number]   CLINIC:  Medical Oncology/Hematology  PCP:  [name], MD [place] [id number] [phone]   REASON FOR VISIT:  Follow-up for metastatic rectal cancer  CURRENT THERAPY:  Xeloda tablets 2 weeks on 1 week off and Bevacizumab                BRIEF ONCOLOGIC HISTORY:    Rectal cancer ([place])   07/24/2010 Initial Diagnosis    Invasive adenocarcinoma of rectum      09/09/2010 Concurrent Chemotherapy    S/P radiation and concurrent 5-FU continuous infusion from 09/09/10- 10/10/10.      11/14/2010 Surgery    S/P proctectomy with colorectal anastomosis and diverting loop ileostomy on 11/14/10 at [facility] by Dr. Harlon Ditty. Pathology reveals a pT3b N1 with 3/20 lymph nodes.      02/05/2011 - 07/14/2011 Chemotherapy    FOLFOX      08/18/2011 Surgery    Approximate date of surgery- [place] by Dr. Harlon Ditty       Remission         11/24/2013 Survivorship    Colonoscopy- somewhat fibrotic/friable anastomotic mucosal-status post biopsy (narrowing not felt to be clinically significant).  Negative pathology for malignancy      09/12/2014 Imaging    DVT in the left femoral venous system, left common iliac vein, IVC, and within the IVC filter Right lower extremity venography confirms chronic occlusion of the femoral venous system with collateralization. The right iliac venous system is patent and do      09/25/2014 PET scan    The right middle lobe pulmonary nodule is hypermetabolic, favored to represent a primary bronchogenic carcinoma.Equivocal mediastinal nodes, similar to surrounding blood pool. Bilateral adrenal hypermetabolism, felt to be physiologic      10/24/2014 Pathology Results    Lung, needle/core biopsy(ies), RML - ADENOCARCINOMA, SEE COMMENT metastatic adenocarcinoma of a colorectal primary      10/24/2014 Relapse/Recurrence         11/16/2014 Definitive Surgery    Bronchoscopy,  right video-assisted thoracoscopy, wedge resection of right middle lobe by Dr. Servando Snare      11/16/2014 Pathology Results    Lung, wedge biopsy/resection, right lingula and small portion of middle lobe - METASTATIC ADENOCARCINOMA, CONSISTENT WITH COLORECTAL PRIMARY, SPANNING 2.0 CM. - THE SURGICAL RESECTION MARGINS ARE NEGATIVE FOR ADENOCARCINOMA.      11/16/2014 Remission    THE SURGICAL RESECTION MARGINS ARE NEGATIVE FOR ADENOCARCINOMA.      03/07/2015 Imaging    CT CAP- Interval resection of right middle lobe metastasis. No acute process or evidence of metastatic disease in the chest, abdomen or pelvis. Improved right upper lobe reticular nodular opacities are favored to represent resolving infection.      09/14/2015 Imaging    CT CAP- No findings of recurrent malignancy. No recurrence along the wedge resection site of the right middle lobe. Right anterior abdominal wall focal hernia containing a knuckle of small bowel without complicating feature.      06/13/2016 Imaging    CT CAP- 1. New right-sided pleural metastasis/mass. Other smaller right-sided pulmonary nodules which are all pleural-based and most likely represent pleural metastasis. 2. No convincing evidence of abdominopelvic nodal metastasis. 3. Constellation of findings, including pancreatic atrophy, duct dilatation, and pancreatic head soft tissue fullness which are highly suspicious for pancreatic adenocarcinoma. Metastatic disease felt much less likely. Consider endoscopic ultrasound sampling or ERCP. Cannot exclude superimposed  acute pancreatitis. 4. New enlargement of the appendiceal tip with subtle surrounding edema. Cannot exclude early or mild appendicitis. 5. Coronary artery atherosclerosis. Aortic atherosclerosis. 6. 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      06/13/2016 Progression    CT scan demonstrates progression of disease      06/19/2016 Procedure    EUS with FNA by Dr.  Ardis Hughs      06/20/2016 Pathology Results    FINE NEEDLE ASPIRATION, ENDOSCOPIC, PANCREAS UNCINATE AREA(SPECIMEN 1 OF 1 COLLECTED 06/19/16): MALIGNANT CELLS CONSISTENT WITH METASTATIC ADENOCARCINOMA.      06/25/2016 PET scan    1. Two pleural-based nodules in the right hemithorax are hypermetabolic. Metastatic disease is a distinct consideration. The scattered pulmonary parenchymal nodule seen on previous diagnostic CT imaging are below the threshold for reliable resolution on PET imaging. 2. Hypermetabolic lesion pancreatic head, consistent with neoplasm. As noted on prior CT, adenocarcinoma is any consideration. No evidence for hypermetabolic abdominal lymphadenopathy. Mottled uptake noted in the liver, but no discrete hepatic metastases are evident on PET imaging. 3. Appendix remains distended up to 0.9-10 mm diameter with a stone towards the tip in subtle periappendiceal edema/inflammation. The appendix is hypermetabolic along its length. Imaging features are relatively stable in the 12 day interval since prior CT scan. While appendicitis is a consideration, the relative stability over 12 days would be unusual for that etiology. Continued close follow-up recommended.      06/26/2016 Pathology Results    Not enough tissue for foundationONE or K-ras testing.      07/07/2016 Procedure    Port placed by Dr. Arnoldo Morale      07/08/2016 -  Chemotherapy    The patient had pegfilgrastim (NEULASTA) injection 6 mg, 6 mg, Subcutaneous,  Once, 0 of 4 cycles  ondansetron (ZOFRAN) IVPB 8 mg, 8 mg, Intravenous,  Once, 0 of 4 cycles  leucovorin 804 mg in dextrose 5 % 250 mL infusion, 400 mg/m2, Intravenous,  Once, 0 of 4 cycles  oxaliplatin (ELOXATIN) 170 mg in dextrose 5 % 500 mL chemo infusion, 85 mg/m2, Intravenous,  Once, 0 of 4 cycles  fluorouracil (ADRUCIL) 4,800 mg in sodium chloride 0.9 % 150 mL chemo infusion, 2,400 mg/m2 = 4,800 mg, Intravenous, 1 Day/Dose, 0 of 4 cycles  fluorouracil  (ADRUCIL) chemo injection 800 mg, 400 mg/m2, Intravenous,  Once, 0 of 4 cycles  pegfilgrastim (NEULASTA) injection 6 mg, 6 mg, Subcutaneous,  Once, 2 of 2 cycles  ondansetron (ZOFRAN) IVPB 8 mg, 8 mg, Intravenous,  Once, 12 of 12 cycles  leucovorin 660 mg in dextrose 5 % 250 mL infusion, 660 mg (original dose ), Intravenous,  Once, 12 of 12 cycles Dose modification: 660 mg (Cycle 1)  oxaliplatin (ELOXATIN) 140 mg in dextrose 5 % 500 mL chemo infusion, 140 mg (original dose ), Intravenous,  Once, 12 of 12 cycles Dose modification: 140 mg (Cycle 1), 119 mg (85 % of original dose 140 mg, Cycle 8, Reason: Provider Judgment)  fluorouracil (ADRUCIL) 3,850 mg in sodium chloride 0.9 % 150 mL chemo infusion, 3,850 mg (original dose ), Intravenous, 1 Day/Dose, 12 of 12 cycles Dose modification: 3,850 mg (Cycle 1)  fluorouracil (ADRUCIL) chemo injection 700 mg, 700 mg (original dose ), Intravenous,  Once, 12 of 12 cycles Dose modification: 700 mg (Cycle 1)  palonosetron (ALOXI) injection 0.25 mg, 0.25 mg, Intravenous,  Once, 7 of 12 cycles Administration: 0.25 mg (09/30/2016)  bevacizumab (AVASTIN) 475 mg in sodium chloride 0.9 % 100  mL chemo infusion, 5 mg/kg = 475 mg, Intravenous,  Once, 6 of 11 cycles Administration: 500 mg (09/30/2016)  leucovorin 880 mg in dextrose 5 % 250 mL infusion, 400 mg/m2 = 880 mg, Intravenous,  Once, 7 of 12 cycles Administration: 880 mg (09/30/2016)  oxaliplatin (ELOXATIN) 185 mg in dextrose 5 % 500 mL chemo infusion, 85 mg/m2 = 185 mg, Intravenous,  Once, 7 of 12 cycles Administration: 185 mg (09/30/2016)  fluorouracil (ADRUCIL) chemo injection 900 mg, 400 mg/m2 = 900 mg, Intravenous,  Once, 7 of 12 cycles Administration: 900 mg (09/30/2016)  fluorouracil (ADRUCIL) 5,300 mg in sodium chloride 0.9 % 144 mL chemo infusion, 2,400 mg/m2 = 5,300 mg, Intravenous, 1 Day/Dose, 7 of 12 cycles Administration: 5,300 mg (09/30/2016)  for chemotherapy treatment.         08/27/2016 Imaging    CT CAP- 1. Pulmonary metastatic disease is stable. 2. Pancreatic head mass, grossly stable, with progressive atrophy of the body and tail of the pancreas. Stable peripancreatic lymph node. 3. Mild circumferential rectal wall thickening, stable. 4. Aortic atherosclerosis (ICD10-170.0). Coronary artery calcification. 5. Hyperattenuating lesion off the lower pole left kidney, stable, too small to characterize. Continued attention on followup exams is warranted. 6. Persistent wall thickening and mild dilatation of the proximal appendix, of uncertain etiology. Continued attention on followup exams is warranted.      11/06/2016 Imaging    CT C/A/P: IMPRESSION: 1. Stable pulmonary metastatic disease. 2. Similar appearing pancreatic head mass. Progressed atrophy of the pancreatic body and tail. Stable peripancreatic lymph node. 3. Stable hypoattenuating lesion partially exophytic off the inferior pole of the left kidney. 4. Aortic atherosclerosis. 5. Persistent mild wall thickening and mild dilatation of the appendix.       01/30/2017 Imaging    Ct C/A/P: IMPRESSION: 1. Stable pulmonary metastatic disease. 2. Stable to slight increase in size of pancreatic head mass. 3.  Aortic Atherosclerosis (ICD10-I70.0). 4. Similar appearance of mild wall thickening of the appendix which contains an appendicolith.      05/01/2017 Imaging    CT C/A/P: Stable disease in the lungs, no progression of disease in the abdomen.        CANCER STAGING: Cancer Staging Rectal cancer ([place]) Staging form: Colon and Rectum, AJCC 7th Edition - Clinical: Stage IIIB (T3, N1, M0) - Signed by [name], MD on 02/04/2011 - Pathologic stage from 11/17/2014: Stage IVA (M1a) - Signed by Baird Cancer, PA-C on 09/14/2015    INTERVAL HISTORY:  [name] 67 y.o. male returns for routine follow-up for metastatic rectal cancer and consideration of his next treatment. Patient is  here today with his wife. He is doing well with his treatment. His appetite is changing. His taste buds change and he eats different foods. Anything with any spice makes his have diarrhea. Patient is very active and his energy levels remain high. Denies any soreness on palms of hands and feet. Denies any nausea and vomiting. Denies any chest pain or SOB. Denies any new pains. Denies any headaches.     REVIEW OF SYSTEMS:  Review of Systems  Gastrointestinal: Positive for diarrhea.  All other systems reviewed and are negative.    PAST MEDICAL/SURGICAL HISTORY:  Past Medical History:  Diagnosis Date  . Chronic anticoagulation   . Colon cancer ([place]) 07/24/2010   rectal ca, inv adenocarcinoma  . Depression 04/01/2011  . Diabetes mellitus without complication ([place])   . DVT (deep venous thrombosis) ([place]) 05/09/2011  . GERD (gastroesophageal reflux disease)   .  High output ileostomy ([place]) 05/21/2011  . History of kidney stones   . HTN (hypertension)   . Hx of radiation therapy 09/02/10 to 10/14/10   pelvis  . Lung metastasis ([place])   . Neuropathy   . Peripheral vascular disease ([place])    dvt's,pe  . Pneumonia    hx x3  . Pulmonary embolism ([place])   . Rectal cancer ([place]) 01/15/2011   S/P radiation and concurrent 5-FU continuous infusion from 09/09/10- 10/10/10.  S/P proctectomy with colorectal anastomosis and diverting loop ileostomy on 11/14/10 at [facility] by Dr. Harlon Ditty. Pathology reveals a pT3b N1 with 3/20 lymph nodes.     Past Surgical History:  Procedure Laterality Date  . COLON SURGERY  11/14/2010   proctectomy with colorectal anastomosis and diverting loop ileostomy (temporary planned)  . COLONOSCOPY  07/2010   proximal rectal apple core mass 10-14cm from anal verge (adenocarcinoma), 2-3cm distal rectal carpet polyp s/p piecemeal snare polypectomy (adenoma)  . COLONOSCOPY  04/21/2012   RMR: Friable,fibrotic appearing colorectal anastomosis producing some luminal narrowing-not felt to  be critical. path: focal erosion with slight inflammation and hyperemia. SURVEILLANCE DUE DEC 2015  . COLONOSCOPY N/A 11/24/2013   Dr. Rourk:somewhat fibrotic/friable anastomotic mucosal-status post biopsy (narrowing not felt to be clinically significant) Single colonic diverticulum. benign polypoid rectal mucosa  . colostomy reversal  april 2013  . ESOPHAGOGASTRODUODENOSCOPY  07/2010   RMR: schatki ring s/p dilation, small hh, SB bx benign  . ESOPHAGOGASTRODUODENOSCOPY N/A 09/04/2015   Procedure: ESOPHAGOGASTRODUODENOSCOPY (EGD);  Surgeon: Daneil Dolin, MD;  Location: [facility] ENDO SUITE;  Service: Endoscopy;  Laterality: N/A;  730  . EUS  08/2010   Dr. Owens Loffler. uT3N0 circumferential, nearly obstruction rectosigmoid adenocarcinoma, distal edge 12cm from anal verge  . EUS N/A 06/19/2016   Procedure: UPPER ENDOSCOPIC ULTRASOUND (EUS) RADIAL;  Surgeon: Milus Banister, MD;  Location: [facility] ENDOSCOPY;  Service: Endoscopy;  Laterality: N/A;  . HERNIA REPAIR     abd hernia repair  . IVC filter    . ivc filter    . port a cath placement    . PORT-A-CATH REMOVAL  09/24/2011   Procedure: REMOVAL PORT-A-CATH;  Surgeon: Donato Heinz, MD;  Location: [facility] ORS;  Service: General;  Laterality: N/A;  Minor Room  . PORTACATH PLACEMENT Right 07/07/2016   Procedure: INSERTION PORT-A-CATH;  Surgeon: Aviva Signs, MD;  Location: [facility] ORS;  Service: General;  Laterality: Right;  Marland Kitchen VIDEO ASSISTED THORACOSCOPY (VATS)/WEDGE RESECTION Right 11/15/2014   Procedure: VIDEO ASSISTED THORACOSCOPY (VATS)/LUNG RESECTION WITH RIGHT LINGULECTOMY;  Surgeon: [name], MD;  Location: [place];  Service: Thoracic;  Laterality: Right;  Marland Kitchen VIDEO BRONCHOSCOPY N/A 11/15/2014   Procedure: VIDEO BRONCHOSCOPY;  Surgeon: [name], MD;  Location: [facility] OR;  Service: Thoracic;  Laterality: N/A;     SOCIAL HISTORY:  Social History   Socioeconomic History  . Marital status: Married    Spouse name: Not on file  . Number of children: 2   . Years of education: Not on file  . Highest education level: Not on file  Occupational History  . Occupation: self-employed Regulatory affairs officer, currently not working    Fish farm manager: SELF EMPLOYED  Social Needs  . Financial resource strain: Not on file  . Food insecurity:    Worry: Not on file    Inability: Not on file  . Transportation needs:    Medical: Not on file    Non-medical: Not on file  Tobacco Use  . Smoking  status: Never Smoker  . Smokeless tobacco: Never Used  Substance and Sexual Activity  . Alcohol use: No  . Drug use: No  . Sexual activity: Yes    Birth control/protection: None    Comment: married  Lifestyle  . Physical activity:    Days per week: Not on file    Minutes per session: Not on file  . Stress: Not on file  Relationships  . Social connections:    Talks on phone: Not on file    Gets together: Not on file    Attends religious service: Not on file    Active member of club or organization: Not on file    Attends meetings of clubs or organizations: Not on file    Relationship status: Not on file  . Intimate partner violence:    Fear of current or ex partner: Not on file    Emotionally abused: Not on file    Physically abused: Not on file    Forced sexual activity: Not on file  Other Topics Concern  . Not on file  Social History Narrative  . Not on file    FAMILY HISTORY:  Family History  Problem Relation Age of Onset  . Cancer Brother        throat  . Cancer Brother        prostate  . Colon cancer Neg Hx   . Liver disease Neg Hx   . Inflammatory bowel disease Neg Hx     CURRENT MEDICATIONS:  Outpatient Encounter Medications as of 11/24/2017  Medication Sig  . acetaminophen (TYLENOL) 500 MG tablet Take 1,000 mg by mouth every 6 (six) hours as needed.  . capecitabine (XELODA) 500 MG tablet Take 3 tabs ('1500mg'$ ) twice a day x 14 days in a row followed by a 7 day rest period. Take after a meal.  . clonazePAM (KLONOPIN) 0.5 MG  tablet Take 0.5 mg by mouth 2 (two) times daily as needed.  . Diphenhyd-Hydrocort-Nystatin (FIRST-DUKES MOUTHWASH) SUSP Use as directed 5 mLs in the mouth or throat 4 (four) times daily.  Marland Kitchen gabapentin (NEURONTIN) 300 MG capsule Take 3 capsules (900 mg total) by mouth 2 (two) times daily.  Marland Kitchen glimepiride (AMARYL) 2 MG tablet Take 2 mg by mouth daily with breakfast.   . leucovorin in dextrose 5 % 250 mL Inject into the vein once.  Marland Kitchen lisinopril (PRINIVIL,ZESTRIL) 10 MG tablet   . magnesium oxide (MAG-OX) 400 (241.3 Mg) MG tablet Take 1 tablet (400 mg total) by mouth 2 (two) times daily.  . mirtazapine (REMERON) 7.5 MG tablet Take 7.5 mg by mouth at bedtime.  Marland Kitchen omeprazole (PRILOSEC) 20 MG capsule Take 20 mg by mouth 2 (two) times daily before a meal.   . ondansetron (ZOFRAN) 8 MG tablet Take 1 tablet (8 mg total) by mouth 2 (two) times daily as needed for refractory nausea / vomiting. Start on day 3 after chemotherapy.  . prochlorperazine (COMPAZINE) 10 MG tablet Take 1 tablet (10 mg total) by mouth every 6 (six) hours as needed (Nausea or vomiting).  . psyllium (REGULOID) 0.52 g capsule Take 0.52 g by mouth daily.  . rivaroxaban (XARELTO) 20 MG TABS tablet Take 1 tablet (20 mg total) by mouth daily with supper.  . tamsulosin (FLOMAX) 0.4 MG CAPS capsule Take 0.4 mg by mouth at bedtime.   . traZODone (DESYREL) 100 MG tablet Take 100 mg by mouth at bedtime.   Facility-Administered Encounter Medications as of 11/24/2017  Medication  .  sodium chloride 0.9 % injection 10 mL  . sodium chloride flush (NS) 0.9 % injection 10 mL    ALLERGIES:  Allergies  Allergen Reactions  . Oxycodone     Blisters, hallucinations  . Tramadol     Blisters, hallucinations  . Trazodone And Nefazodone Other (See Comments)    hallucinations     PHYSICAL EXAM:  ECOG Performance status: 1  Vitals:   11/24/17 1006  BP: (!) 146/86  Pulse: 95  Resp: 18  Temp: 97.7 F (36.5 C)  SpO2: 100%   Filed Weights    11/24/17 1006  Weight: 209 lb 4.8 oz (94.9 kg)    Physical Exam   LABORATORY DATA:  I have reviewed the labs as listed.  CBC    Component Value Date/Time   WBC 6.2 11/24/2017 0933   RBC 4.06 (L) 11/24/2017 0933   HGB 13.7 11/24/2017 0933   HGB 11.6 (L) 02/04/2011 1059   HCT 40.8 11/24/2017 0933   HCT 34.7 (L) 02/04/2011 1059   PLT 186 11/24/2017 0933   PLT 282 02/04/2011 1059   MCV 100.5 (H) 11/24/2017 0933   MCV 76.0 (L) 02/04/2011 1059   MCH 33.7 11/24/2017 0933   MCHC 33.6 11/24/2017 0933   RDW 17.1 (H) 11/24/2017 0933   RDW 18.5 (H) 02/04/2011 1059   LYMPHSABS 1.8 11/24/2017 0933   LYMPHSABS 1.1 02/04/2011 1059   MONOABS 0.5 11/24/2017 0933   MONOABS 0.5 02/04/2011 1059   EOSABS 0.2 11/24/2017 0933   EOSABS 0.1 02/04/2011 1059   BASOSABS 0.0 11/24/2017 0933   BASOSABS 0.1 02/04/2011 1059   CMP Latest Ref Rng & Units 11/24/2017 11/03/2017 10/13/2017  Glucose 70 - 99 mg/dL 238(H) 230(H) 281(H)  BUN 8 - 23 mg/dL _0 Creatinine 0.61 - 1.24 mg/dL 0.70 0.74 0.76  Sodium 135 - 145 mmol/L 139 142 140  Potassium 3.5 - 5.1 mmol/L 4.3 4.4 3.9  Chloride 98 - 111 mmol/L 109 110 107  CO2 22 - 32 mmol/L _1 Calcium 8.9 - 10.3 mg/dL 8.9 8.7(L) 8.9  Total Protein 6.5 - 8.1 g/dL 6.0(L) 6.2(L) 6.3(L)  Total Bilirubin 0.3 - 1.2 mg/dL 0.9 1.5(H) 1.7(H)  Alkaline Phos 38 - 126 U/L 87 67 79  AST 15 - 41 U/L _2 ALT 0 - 44 U/L _3 DIAGNOSTIC IMAGING:  CT CAP on 11/23/2017 was reviewed by me and discussed with the patient.     ASSESSMENT & PLAN:   Rectal cancer 1.  Stage IV rectal cancer with lung and pancreatic metastasis: -He was on maintenance 5-FU and bevacizumab.  He started Xeloda first cycle on 08/07/2017 and took 4 pills twice a day.  He is on a 2 weeks on 1 week of regimen.  He felt very tired after starting Xeloda.  He also had flulike symptoms.  His appetite has gone down. - We have cut back on Xeloda dose to 1500 mg twice a day during  cycle 2.  He is tolerating that dose very well.  No issues with mucositis, hand-foot skin reaction or diarrhea.  No issues with the side effects from bevacizumab.  His blood pressure systolic was slightly high at 146.  He will follow-up with Dr. Woody Seller for optimal management.  His lisinopril is dosed at 40 mg daily. -We reviewed the results of the CT scan of the chest, abdomen and pelvis dated 11/23/2017 which showed stable disease.  CEA on  11/03/2017 was normal at 4.2.  Hence we will continue the same regimen.  He will come back in 3 weeks for his next Avastin.  I will see him back in 6 weeks for follow-up.  2.  Neuropathy: He has continues numbness and tingling in the hands and feet.  He is taking gabapentin 900 mg twice daily.  He gets pains if he does not take gabapentin.  3.  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  He will continue Xarelto 20 mg daily.  4. Rectal pain: He takes hydrocodone 5 mg as needed for rectal pain.  He does not take it on a regular basis.  5.  Hypomagnesemia: Last magnesium was 1.8.  He will continue magnesium twice daily.      Orders placed this encounter:  Orders Placed This Encounter  Procedures  . Magnesium  . CBC with Differential/Platelet  . Comprehensive metabolic panel  . Magnesium  . CBC with Differential/Platelet  . Comprehensive metabolic panel  . CEA      Charles Jack, MD [place] [phone]

## 2017-11-24 NOTE — Assessment & Plan Note (Signed)
1.  Stage IV rectal cancer with lung and pancreatic metastasis: -He was on maintenance 5-FU and bevacizumab.  He started Xeloda first cycle on 08/07/2017 and took 4 pills twice a day.  He is on a 2 weeks on 1 week of regimen.  He felt very tired after starting Xeloda.  He also had flulike symptoms.  His appetite has gone down. - We have cut back on Xeloda dose to 1500 mg twice a day during cycle 2.  He is tolerating that dose very well.  No issues with mucositis, hand-foot skin reaction or diarrhea.  No issues with the side effects from bevacizumab.  His blood pressure systolic was slightly high at 146.  He will follow-up with Dr. Woody Seller for optimal management.  His lisinopril is dosed at 40 mg daily. -We reviewed the results of the CT scan of the chest, abdomen and pelvis dated 11/23/2017 which showed stable disease.  CEA on 11/03/2017 was normal at 4.2.  Hence we will continue the same regimen.  He will come back in 3 weeks for his next Avastin.  I will see him back in 6 weeks for follow-up.  2.  Neuropathy: He has continues numbness and tingling in the hands and feet.  He is taking gabapentin 900 mg twice daily.  He gets pains if he does not take gabapentin.  3.  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  He will continue Xarelto 20 mg daily.  4. Rectal pain: He takes hydrocodone 5 mg as needed for rectal pain.  He does not take it on a regular basis.  5.  Hypomagnesemia: Last magnesium was 1.8.  He will continue magnesium twice daily.

## 2017-11-24 NOTE — Patient Instructions (Addendum)
[place] at [facility] Discharge Instructions  We will see you back in 3 weeks for labs and treatment. You will see the doctor in 6 weeks with your next treatment.   Today you saw Dr. Raliegh Ip.    Thank you for choosing [place] at [facility] to provide your oncology and hematology care.  To afford each patient quality time with our provider, please arrive at least 15 minutes before your scheduled appointment time.   If you have a lab appointment with the [facility] please come in thru the  Main Entrance and check in at the main information desk  You need to re-schedule your appointment should you arrive 10 or more minutes late.  We strive to give you quality time with our providers, and arriving late affects you and other patients whose appointments are after yours.  Also, if you no show three or more times for appointments you may be dismissed from the clinic at the providers discretion.     Again, thank you for choosing [facility].  Our hope is that these requests will decrease the amount of time that you wait before being seen by our physicians.       _____________________________________________________________  Should you have questions after your visit to [facility], please contact our office at [phone] between the hours of 8:30 a.m. and 4:30 p.m.  Voicemails left after 4:30 p.m. will not be returned until the following business day.  For prescription refill requests, have your pharmacy contact our office.       Resources For Cancer Patients and their Caregivers ? American Cancer Society: Can assist with transportation, wigs, general needs, runs Look Good Feel Better.        737-601-5198 ? Cancer Care: Provides financial assistance, online support groups, medication/co-pay assistance.  1-800-813-HOPE 715-047-5957) ? [place] Assists [place] cancer patients and their  families through emotional , educational and financial support.  915-725-4694 ? [place] DSS Where to apply for food stamps, Medicaid and utility assistance. 318-339-2928 ? [employer]: Transportation to medical appointments. 636-064-5788 ? Social Security Administration: May apply for disability if have a Stage IV cancer. 928 201 3387 (986)115-5788 ? [employer], Disability and Transit Services: Assists with nutrition, care and transit needs. [facility] Support Programs:   > Cancer Support Group  2nd Tuesday of the month 1pm-2pm, Journey Room   > Creative Journey  3rd Tuesday of the month 1130am-1pm, Journey Room

## 2017-11-25 LAB — CEA: CEA: 5.7 ng/mL — ABNORMAL HIGH (ref 0.0–4.7)

## 2017-12-09 ENCOUNTER — Other Ambulatory Visit (HOSPITAL_COMMUNITY): Payer: Self-pay | Admitting: Hematology

## 2017-12-09 DIAGNOSIS — C20 Malignant neoplasm of rectum: Secondary | ICD-10-CM

## 2017-12-09 DIAGNOSIS — C7800 Secondary malignant neoplasm of unspecified lung: Secondary | ICD-10-CM

## 2017-12-09 MED FILL — XELODA 500 MG TABLET: 500 | 21 days supply | Qty: 84 | Fill #0

## 2017-12-15 ENCOUNTER — Inpatient Hospital Stay (HOSPITAL_COMMUNITY): Payer: Medicare Other

## 2017-12-15 ENCOUNTER — Inpatient Hospital Stay (HOSPITAL_COMMUNITY): Payer: Medicare Other | Attending: Hematology

## 2017-12-15 VITALS — BP 152/76 | HR 64 | Temp 98.4°F | Resp 18 | Wt 211.6 lb

## 2017-12-15 DIAGNOSIS — C20 Malignant neoplasm of rectum: Secondary | ICD-10-CM

## 2017-12-15 DIAGNOSIS — Z7901 Long term (current) use of anticoagulants: Secondary | ICD-10-CM | POA: Diagnosis not present

## 2017-12-15 DIAGNOSIS — C7800 Secondary malignant neoplasm of unspecified lung: Secondary | ICD-10-CM | POA: Insufficient documentation

## 2017-12-15 DIAGNOSIS — G629 Polyneuropathy, unspecified: Secondary | ICD-10-CM | POA: Diagnosis not present

## 2017-12-15 DIAGNOSIS — Z5112 Encounter for antineoplastic immunotherapy: Secondary | ICD-10-CM | POA: Insufficient documentation

## 2017-12-15 DIAGNOSIS — Z79899 Other long term (current) drug therapy: Secondary | ICD-10-CM | POA: Diagnosis not present

## 2017-12-15 DIAGNOSIS — C7889 Secondary malignant neoplasm of other digestive organs: Secondary | ICD-10-CM | POA: Insufficient documentation

## 2017-12-15 DIAGNOSIS — G893 Neoplasm related pain (acute) (chronic): Secondary | ICD-10-CM | POA: Insufficient documentation

## 2017-12-15 DIAGNOSIS — Z86718 Personal history of other venous thrombosis and embolism: Secondary | ICD-10-CM | POA: Insufficient documentation

## 2017-12-15 DIAGNOSIS — C187 Malignant neoplasm of sigmoid colon: Secondary | ICD-10-CM

## 2017-12-15 LAB — CBC WITH DIFFERENTIAL/PLATELET
Basophils Absolute: 0 10*3/uL (ref 0.0–0.1)
Basophils Relative: 0 %
Eosinophils Absolute: 0.2 10*3/uL (ref 0.0–0.7)
Eosinophils Relative: 4 %
HCT: 39.9 % (ref 39.0–52.0)
Hemoglobin: 13.2 g/dL (ref 13.0–17.0)
Lymphocytes Relative: 30 %
Lymphs Abs: 1.8 10*3/uL (ref 0.7–4.0)
MCH: 33.6 pg (ref 26.0–34.0)
MCHC: 33.1 g/dL (ref 30.0–36.0)
MCV: 101.5 fL — ABNORMAL HIGH (ref 78.0–100.0)
Monocytes Absolute: 0.5 10*3/uL (ref 0.1–1.0)
Monocytes Relative: 8 %
Neutro Abs: 3.5 10*3/uL (ref 1.7–7.7)
Neutrophils Relative %: 58 %
Platelets: 167 10*3/uL (ref 150–400)
RBC: 3.93 MIL/uL — ABNORMAL LOW (ref 4.22–5.81)
RDW: 17.3 % — ABNORMAL HIGH (ref 11.5–15.5)
WBC: 6.1 10*3/uL (ref 4.0–10.5)

## 2017-12-15 LAB — COMPREHENSIVE METABOLIC PANEL
ALT: 20 U/L (ref 0–44)
AST: 22 U/L (ref 15–41)
Albumin: 3.3 g/dL — ABNORMAL LOW (ref 3.5–5.0)
Alkaline Phosphatase: 74 U/L (ref 38–126)
Anion gap: 4 — ABNORMAL LOW (ref 5–15)
BUN: 10 mg/dL (ref 8–23)
CO2: 28 mmol/L (ref 22–32)
Calcium: 8.6 mg/dL — ABNORMAL LOW (ref 8.9–10.3)
Chloride: 109 mmol/L (ref 98–111)
Creatinine, Ser: 0.78 mg/dL (ref 0.61–1.24)
GFR calc Af Amer: >60 mL/min (ref >60)
GFR calc non Af Amer: >60 mL/min (ref >60)
Glucose, Bld: 191 mg/dL — ABNORMAL HIGH (ref 70–99)
Potassium: 3.9 mmol/L (ref 3.5–5.1)
Sodium: 141 mmol/L (ref 135–145)
Total Bilirubin: 1.8 mg/dL — ABNORMAL HIGH (ref 0.3–1.2)
Total Protein: 5.9 g/dL — ABNORMAL LOW (ref 6.5–8.1)

## 2017-12-15 LAB — URINALYSIS, DIPSTICK ONLY
Bilirubin Urine: NEGATIVE
Glucose, UA: 50 mg/dL — AB
Hgb urine dipstick: NEGATIVE
Ketones, ur: NEGATIVE mg/dL
Leukocytes, UA: NEGATIVE
Nitrite: NEGATIVE
Protein, ur: NEGATIVE mg/dL
Specific Gravity, Urine: 1.02 (ref 1.005–1.030)
pH: 5 (ref 5.0–8.0)

## 2017-12-15 LAB — MAGNESIUM: Magnesium: 1.9 mg/dL (ref 1.7–2.4)

## 2017-12-15 MED ORDER — HEPARIN SOD (PORK) LOCK FLUSH 100 UNIT/ML IV SOLN
500.0000 [IU] | Freq: Once | INTRAVENOUS | Status: AC | PRN
  Administered 2017-12-15: 500 [IU]

## 2017-12-15 MED ORDER — SODIUM CHLORIDE 0.9% FLUSH
10.0000 mL | INTRAVENOUS | Status: DC | PRN
  Administered 2017-12-15: 10 mL
  Filled 2017-12-15: qty 10

## 2017-12-15 MED ORDER — BEVACIZUMAB CHEMO INJECTION 400 MG/16ML
700.0000 mg | Freq: Once | INTRAVENOUS | Status: AC
  Administered 2017-12-15: 700 mg via INTRAVENOUS
  Filled 2017-12-15: qty 12

## 2017-12-15 MED ORDER — SODIUM CHLORIDE 0.9 % IV SOLN
Freq: Once | INTRAVENOUS | Status: AC
  Administered 2017-12-15: 12:00:00 via INTRAVENOUS

## 2017-12-15 NOTE — Progress Notes (Signed)
Reviewed lab work with Dr. [name] and Total Bilirubin 1.8 today.  Ok to treat today and have the patient return next week for repeat LFT verbal order Dr. [name].   Patient tolerated chemotherapy with no complaints voiced.  Port site clean and dry with no bruising or swelling noted at site.  Good blood return noted before and after administration of chemotherapy.  Band aid applied.  VSS with discharge and left ambulatory with family with no s/s of distress noted.

## 2017-12-15 NOTE — Patient Instructions (Signed)
[facility] Discharge Instructions for Patients Receiving Chemotherapy  Today you received the following chemotherapy agents avastin.    If you develop nausea and vomiting that is not controlled by your nausea medication, call the clinic.   BELOW ARE SYMPTOMS THAT SHOULD BE REPORTED IMMEDIATELY:  *FEVER GREATER THAN 100.5 F  *CHILLS WITH OR WITHOUT FEVER  NAUSEA AND VOMITING THAT IS NOT CONTROLLED WITH YOUR NAUSEA MEDICATION  *UNUSUAL SHORTNESS OF BREATH  *UNUSUAL BRUISING OR BLEEDING  TENDERNESS IN MOUTH AND THROAT WITH OR WITHOUT PRESENCE OF ULCERS  *URINARY PROBLEMS  *BOWEL PROBLEMS  UNUSUAL RASH Items with * indicate a potential emergency and should be followed up as soon as possible.  Feel free to call the clinic should you have any questions or concerns. The clinic phone number is [phone].  Please show the [facility] at check-in to the Emergency Department and triage nurse.

## 2017-12-16 DIAGNOSIS — Z86718 Personal history of other venous thrombosis and embolism: Secondary | ICD-10-CM | POA: Diagnosis not present

## 2017-12-16 DIAGNOSIS — M79662 Pain in left lower leg: Secondary | ICD-10-CM | POA: Diagnosis not present

## 2017-12-16 DIAGNOSIS — Z8507 Personal history of malignant neoplasm of pancreas: Secondary | ICD-10-CM | POA: Diagnosis not present

## 2017-12-16 DIAGNOSIS — Z85118 Personal history of other malignant neoplasm of bronchus and lung: Secondary | ICD-10-CM | POA: Diagnosis not present

## 2017-12-16 DIAGNOSIS — I10 Essential (primary) hypertension: Secondary | ICD-10-CM | POA: Diagnosis not present

## 2017-12-16 DIAGNOSIS — K219 Gastro-esophageal reflux disease without esophagitis: Secondary | ICD-10-CM | POA: Diagnosis not present

## 2017-12-16 DIAGNOSIS — Z79899 Other long term (current) drug therapy: Secondary | ICD-10-CM | POA: Diagnosis not present

## 2017-12-16 DIAGNOSIS — Z86711 Personal history of pulmonary embolism: Secondary | ICD-10-CM | POA: Diagnosis not present

## 2017-12-16 DIAGNOSIS — R609 Edema, unspecified: Secondary | ICD-10-CM | POA: Diagnosis not present

## 2017-12-16 DIAGNOSIS — R200 Anesthesia of skin: Secondary | ICD-10-CM | POA: Diagnosis not present

## 2017-12-16 DIAGNOSIS — Z923 Personal history of irradiation: Secondary | ICD-10-CM | POA: Diagnosis not present

## 2017-12-16 DIAGNOSIS — Z85048 Personal history of other malignant neoplasm of rectum, rectosigmoid junction, and anus: Secondary | ICD-10-CM | POA: Diagnosis not present

## 2017-12-16 DIAGNOSIS — Z7901 Long term (current) use of anticoagulants: Secondary | ICD-10-CM | POA: Diagnosis not present

## 2017-12-16 DIAGNOSIS — M79605 Pain in left leg: Secondary | ICD-10-CM | POA: Diagnosis not present

## 2017-12-22 ENCOUNTER — Inpatient Hospital Stay (HOSPITAL_COMMUNITY): Payer: Medicare Other

## 2017-12-22 DIAGNOSIS — C20 Malignant neoplasm of rectum: Secondary | ICD-10-CM | POA: Diagnosis not present

## 2017-12-22 DIAGNOSIS — C7889 Secondary malignant neoplasm of other digestive organs: Secondary | ICD-10-CM | POA: Diagnosis not present

## 2017-12-22 DIAGNOSIS — C7800 Secondary malignant neoplasm of unspecified lung: Secondary | ICD-10-CM

## 2017-12-22 DIAGNOSIS — C187 Malignant neoplasm of sigmoid colon: Secondary | ICD-10-CM

## 2017-12-22 DIAGNOSIS — Z5112 Encounter for antineoplastic immunotherapy: Secondary | ICD-10-CM | POA: Diagnosis not present

## 2017-12-22 DIAGNOSIS — G893 Neoplasm related pain (acute) (chronic): Secondary | ICD-10-CM | POA: Diagnosis not present

## 2017-12-22 LAB — COMPREHENSIVE METABOLIC PANEL
ALT: 19 U/L (ref 0–44)
AST: 23 U/L (ref 15–41)
Albumin: 3.5 g/dL (ref 3.5–5.0)
Alkaline Phosphatase: 72 U/L (ref 38–126)
Anion gap: 10 (ref 5–15)
BUN: 9 mg/dL (ref 8–23)
CO2: 20 mmol/L — ABNORMAL LOW (ref 22–32)
Calcium: 8.4 mg/dL — ABNORMAL LOW (ref 8.9–10.3)
Chloride: 111 mmol/L (ref 98–111)
Creatinine, Ser: 0.72 mg/dL (ref 0.61–1.24)
GFR calc Af Amer: >60 mL/min (ref >60)
GFR calc non Af Amer: >60 mL/min (ref >60)
Glucose, Bld: 258 mg/dL — ABNORMAL HIGH (ref 70–99)
Potassium: 3.3 mmol/L — ABNORMAL LOW (ref 3.5–5.1)
Sodium: 141 mmol/L (ref 135–145)
Total Bilirubin: 2.2 mg/dL — ABNORMAL HIGH (ref 0.3–1.2)
Total Protein: 6.1 g/dL — ABNORMAL LOW (ref 6.5–8.1)

## 2017-12-24 ENCOUNTER — Other Ambulatory Visit (HOSPITAL_COMMUNITY): Payer: Self-pay | Admitting: *Deleted

## 2017-12-29 ENCOUNTER — Inpatient Hospital Stay (HOSPITAL_COMMUNITY): Payer: Medicare Other

## 2017-12-29 DIAGNOSIS — G893 Neoplasm related pain (acute) (chronic): Secondary | ICD-10-CM | POA: Diagnosis not present

## 2017-12-29 DIAGNOSIS — C20 Malignant neoplasm of rectum: Secondary | ICD-10-CM

## 2017-12-29 DIAGNOSIS — C7800 Secondary malignant neoplasm of unspecified lung: Secondary | ICD-10-CM

## 2017-12-29 DIAGNOSIS — C187 Malignant neoplasm of sigmoid colon: Secondary | ICD-10-CM

## 2017-12-29 DIAGNOSIS — C7889 Secondary malignant neoplasm of other digestive organs: Secondary | ICD-10-CM | POA: Diagnosis not present

## 2017-12-29 DIAGNOSIS — Z5112 Encounter for antineoplastic immunotherapy: Secondary | ICD-10-CM | POA: Diagnosis not present

## 2017-12-29 LAB — CBC WITH DIFFERENTIAL/PLATELET
Basophils Absolute: 0 10*3/uL (ref 0.0–0.1)
Basophils Relative: 0 %
Eosinophils Absolute: 0.1 10*3/uL (ref 0.0–0.7)
Eosinophils Relative: 2 %
HCT: 41.2 % (ref 39.0–52.0)
Hemoglobin: 13.5 g/dL (ref 13.0–17.0)
Lymphocytes Relative: 35 %
Lymphs Abs: 1.9 10*3/uL (ref 0.7–4.0)
MCH: 33.3 pg (ref 26.0–34.0)
MCHC: 32.8 g/dL (ref 30.0–36.0)
MCV: 101.7 fL — ABNORMAL HIGH (ref 78.0–100.0)
Monocytes Absolute: 0.4 10*3/uL (ref 0.1–1.0)
Monocytes Relative: 7 %
Neutro Abs: 3 10*3/uL (ref 1.7–7.7)
Neutrophils Relative %: 56 %
Platelets: 173 10*3/uL (ref 150–400)
RBC: 4.05 MIL/uL — ABNORMAL LOW (ref 4.22–5.81)
RDW: 17.3 % — ABNORMAL HIGH (ref 11.5–15.5)
WBC: 5.3 10*3/uL (ref 4.0–10.5)

## 2017-12-29 LAB — COMPREHENSIVE METABOLIC PANEL
ALT: 21 U/L (ref 0–44)
AST: 27 U/L (ref 15–41)
Albumin: 3.5 g/dL (ref 3.5–5.0)
Alkaline Phosphatase: 76 U/L (ref 38–126)
Anion gap: 8 (ref 5–15)
BUN: 8 mg/dL (ref 8–23)
CO2: 24 mmol/L (ref 22–32)
Calcium: 8.7 mg/dL — ABNORMAL LOW (ref 8.9–10.3)
Chloride: 110 mmol/L (ref 98–111)
Creatinine, Ser: 0.84 mg/dL (ref 0.61–1.24)
GFR calc Af Amer: >60 mL/min (ref >60)
GFR calc non Af Amer: >60 mL/min (ref >60)
Glucose, Bld: 242 mg/dL — ABNORMAL HIGH (ref 70–99)
Potassium: 4.3 mmol/L (ref 3.5–5.1)
Sodium: 142 mmol/L (ref 135–145)
Total Bilirubin: 1.8 mg/dL — ABNORMAL HIGH (ref 0.3–1.2)
Total Protein: 6.1 g/dL — ABNORMAL LOW (ref 6.5–8.1)

## 2018-01-01 MED FILL — XELODA 500 MG TABLET: 500 | 21 days supply | Qty: 84 | Fill #1

## 2018-01-05 ENCOUNTER — Encounter (HOSPITAL_COMMUNITY): Payer: Self-pay | Admitting: Hematology

## 2018-01-05 ENCOUNTER — Other Ambulatory Visit: Payer: Self-pay

## 2018-01-05 ENCOUNTER — Inpatient Hospital Stay (HOSPITAL_COMMUNITY): Payer: Medicare Other

## 2018-01-05 ENCOUNTER — Inpatient Hospital Stay (HOSPITAL_BASED_OUTPATIENT_CLINIC_OR_DEPARTMENT_OTHER): Payer: Medicare Other | Admitting: Hematology

## 2018-01-05 ENCOUNTER — Inpatient Hospital Stay (HOSPITAL_COMMUNITY): Payer: Medicare Other | Attending: Hematology

## 2018-01-05 VITALS — BP 148/83 | HR 73 | Temp 98.3°F | Resp 18

## 2018-01-05 VITALS — BP 157/75 | HR 84 | Temp 98.0°F | Resp 18 | Wt 209.4 lb

## 2018-01-05 DIAGNOSIS — C187 Malignant neoplasm of sigmoid colon: Secondary | ICD-10-CM

## 2018-01-05 DIAGNOSIS — C7889 Secondary malignant neoplasm of other digestive organs: Secondary | ICD-10-CM | POA: Diagnosis not present

## 2018-01-05 DIAGNOSIS — Z5112 Encounter for antineoplastic immunotherapy: Secondary | ICD-10-CM | POA: Diagnosis not present

## 2018-01-05 DIAGNOSIS — Z79899 Other long term (current) drug therapy: Secondary | ICD-10-CM | POA: Diagnosis not present

## 2018-01-05 DIAGNOSIS — Z9221 Personal history of antineoplastic chemotherapy: Secondary | ICD-10-CM | POA: Insufficient documentation

## 2018-01-05 DIAGNOSIS — Z923 Personal history of irradiation: Secondary | ICD-10-CM

## 2018-01-05 DIAGNOSIS — C20 Malignant neoplasm of rectum: Secondary | ICD-10-CM | POA: Insufficient documentation

## 2018-01-05 DIAGNOSIS — Z7901 Long term (current) use of anticoagulants: Secondary | ICD-10-CM

## 2018-01-05 DIAGNOSIS — Z86718 Personal history of other venous thrombosis and embolism: Secondary | ICD-10-CM | POA: Diagnosis not present

## 2018-01-05 DIAGNOSIS — E1140 Type 2 diabetes mellitus with diabetic neuropathy, unspecified: Secondary | ICD-10-CM | POA: Diagnosis not present

## 2018-01-05 DIAGNOSIS — C7800 Secondary malignant neoplasm of unspecified lung: Secondary | ICD-10-CM

## 2018-01-05 DIAGNOSIS — G893 Neoplasm related pain (acute) (chronic): Secondary | ICD-10-CM | POA: Insufficient documentation

## 2018-01-05 DIAGNOSIS — Z86711 Personal history of pulmonary embolism: Secondary | ICD-10-CM

## 2018-01-05 LAB — URINALYSIS, DIPSTICK ONLY
Bilirubin Urine: NEGATIVE
Glucose, UA: NEGATIVE mg/dL
Hgb urine dipstick: NEGATIVE
Ketones, ur: NEGATIVE mg/dL
Leukocytes, UA: NEGATIVE
Nitrite: NEGATIVE
Protein, ur: NEGATIVE mg/dL
Specific Gravity, Urine: 1.021 (ref 1.005–1.030)
pH: 5 (ref 5.0–8.0)

## 2018-01-05 LAB — CBC WITH DIFFERENTIAL/PLATELET
Basophils Absolute: 0 10*3/uL (ref 0.0–0.1)
Basophils Relative: 0 %
Eosinophils Absolute: 0.2 10*3/uL (ref 0.0–0.7)
Eosinophils Relative: 3 %
HCT: 41.2 % (ref 39.0–52.0)
Hemoglobin: 13.5 g/dL (ref 13.0–17.0)
Lymphocytes Relative: 27 %
Lymphs Abs: 1.7 10*3/uL (ref 0.7–4.0)
MCH: 33.6 pg (ref 26.0–34.0)
MCHC: 32.8 g/dL (ref 30.0–36.0)
MCV: 102.5 fL — ABNORMAL HIGH (ref 78.0–100.0)
Monocytes Absolute: 0.5 10*3/uL (ref 0.1–1.0)
Monocytes Relative: 7 %
Neutro Abs: 3.9 10*3/uL (ref 1.7–7.7)
Neutrophils Relative %: 63 %
Platelets: 189 10*3/uL (ref 150–400)
RBC: 4.02 MIL/uL — ABNORMAL LOW (ref 4.22–5.81)
RDW: 17.4 % — ABNORMAL HIGH (ref 11.5–15.5)
WBC: 6.3 10*3/uL (ref 4.0–10.5)

## 2018-01-05 LAB — COMPREHENSIVE METABOLIC PANEL
ALT: 23 U/L (ref 0–44)
AST: 23 U/L (ref 15–41)
Albumin: 3.5 g/dL (ref 3.5–5.0)
Alkaline Phosphatase: 78 U/L (ref 38–126)
Anion gap: 5 (ref 5–15)
BUN: 11 mg/dL (ref 8–23)
CO2: 27 mmol/L (ref 22–32)
Calcium: 8.5 mg/dL — ABNORMAL LOW (ref 8.9–10.3)
Chloride: 109 mmol/L (ref 98–111)
Creatinine, Ser: 0.78 mg/dL (ref 0.61–1.24)
GFR calc Af Amer: >60 mL/min (ref >60)
GFR calc non Af Amer: >60 mL/min (ref >60)
Glucose, Bld: 223 mg/dL — ABNORMAL HIGH (ref 70–99)
Potassium: 4.4 mmol/L (ref 3.5–5.1)
Sodium: 141 mmol/L (ref 135–145)
Total Bilirubin: 1.6 mg/dL — ABNORMAL HIGH (ref 0.3–1.2)
Total Protein: 6.1 g/dL — ABNORMAL LOW (ref 6.5–8.1)

## 2018-01-05 LAB — MAGNESIUM: Magnesium: 1.8 mg/dL (ref 1.7–2.4)

## 2018-01-05 MED ORDER — SODIUM CHLORIDE 0.9% FLUSH
10.0000 mL | INTRAVENOUS | Status: DC | PRN
  Administered 2018-01-05: 10 mL
  Filled 2018-01-05: qty 10

## 2018-01-05 MED ORDER — HEPARIN SOD (PORK) LOCK FLUSH 100 UNIT/ML IV SOLN
500.0000 [IU] | Freq: Once | INTRAVENOUS | Status: AC | PRN
  Administered 2018-01-05: 500 [IU]

## 2018-01-05 MED ORDER — SODIUM CHLORIDE 0.9 % IV SOLN
Freq: Once | INTRAVENOUS | Status: AC
  Administered 2018-01-05: 11:00:00 via INTRAVENOUS

## 2018-01-05 MED ORDER — SODIUM CHLORIDE 0.9 % IV SOLN
700.0000 mg | Freq: Once | INTRAVENOUS | Status: AC
  Administered 2018-01-05: 700 mg via INTRAVENOUS
  Filled 2018-01-05: qty 16

## 2018-01-05 NOTE — Assessment & Plan Note (Signed)
1.  Stage IV rectal cancer with lung and pancreatic metastasis: -He was on maintenance 5-FU and bevacizumab.  He started Xeloda first cycle on 08/07/2017 and took 4 pills twice a day.  He is on a 2 weeks on 1 week of regimen.  He felt very tired after starting Xeloda.  He also had flulike symptoms.  His appetite has gone down. - We have cut back on Xeloda dose to 1500 mg twice a day during cycle 2.  He is tolerating that dose very well.  No issues with mucositis, hand-foot skin reaction or diarrhea.  No issues with the side effects from bevacizumab.  His blood pressure systolic was slightly high at 146.  He will follow-up with Dr. Woody Seller for optimal management.  His lisinopril is dosed at 40 mg daily. -Last CT scan of the chest, abdomen and pelvis was done on 11/23/2017 which showed stable disease.  CEA on 11/03/2017 was normal at 4.2. -he is tolerating the current regimen very well.  He is continuing to be very active.  He will proceed with bevacizumab today.  He will start his next cycle of Xeloda tomorrow.  No hand-foot skin reaction reported. - We will plan to repeat CT scans and end of October.  He will be seen back in 6 weeks.  2.  Neuropathy: He has numbness in the hands and feet.  He is taking gabapentin 900 mg twice daily.  This is well controlled.  3.  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  He will continue Xarelto 20 mg daily.  4. Rectal pain: He has hydrocodone 5 mg to be taken as needed.  He has not needed any pill in the past few weeks.   5.  Hypomagnesemia: He is taking magnesium once daily.  His magnesium today is normal at 1.9.

## 2018-01-05 NOTE — Progress Notes (Signed)
1040 Labs and UA reviewed and patient seen by Dr. [name] who approved patient for treatment today.

## 2018-01-05 NOTE — Patient Instructions (Signed)
[place] at [facility] Discharge Instructions Follow up in 6 weeks with scans and labs prior to your visit. Continue your normal treatment schedule.    Thank you for choosing [place] at [facility] to provide your oncology and hematology care.  To afford each patient quality time with our provider, please arrive at least 15 minutes before your scheduled appointment time.   If you have a lab appointment with the [facility] please come in thru the  Main Entrance and check in at the main information desk  You need to re-schedule your appointment should you arrive 10 or more minutes late.  We strive to give you quality time with our providers, and arriving late affects you and other patients whose appointments are after yours.  Also, if you no show three or more times for appointments you may be dismissed from the clinic at the providers discretion.     Again, thank you for choosing [facility].  Our hope is that these requests will decrease the amount of time that you wait before being seen by our physicians.       _____________________________________________________________  Should you have questions after your visit to [facility], please contact our office at [phone] between the hours of 8:00 a.m. and 4:30 p.m.  Voicemails left after 4:00 p.m. will not be returned until the following business day.  For prescription refill requests, have your pharmacy contact our office and allow 72 hours.    Cancer Center Support Programs:   > Cancer Support Group  2nd Tuesday of the month 1pm-2pm, Journey Room

## 2018-01-05 NOTE — Progress Notes (Signed)
[facility] [place], [place] [address]   CLINIC:  Medical Oncology/Hematology  PCP:  [name], MD [place] [address] [phone]   REASON FOR VISIT:  Follow-up for rectal cancer  CURRENT THERAPY: Xeloda tablets 2 weeks on 1 week off and Bevacizumab  BRIEF ONCOLOGIC HISTORY:    Rectal cancer ([place])   07/24/2010 Initial Diagnosis    Invasive adenocarcinoma of rectum    09/09/2010 Concurrent Chemotherapy    S/P radiation and concurrent 5-FU continuous infusion from 09/09/10- 10/10/10.    11/14/2010 Surgery    S/P proctectomy with colorectal anastomosis and diverting loop ileostomy on 11/14/10 at [facility] by Dr. Harlon Ditty. Pathology reveals a pT3b N1 with 3/20 lymph nodes.    02/05/2011 - 07/14/2011 Chemotherapy    FOLFOX    08/18/2011 Surgery    Approximate date of surgery- [place] by Dr. Harlon Ditty     Remission       11/24/2013 Survivorship    Colonoscopy- somewhat fibrotic/friable anastomotic mucosal-status post biopsy (narrowing not felt to be clinically significant).  Negative pathology for malignancy    09/12/2014 Imaging    DVT in the left femoral venous system, left common iliac vein, IVC, and within the IVC filter Right lower extremity venography confirms chronic occlusion of the femoral venous system with collateralization. The right iliac venous system is patent and do    09/25/2014 PET scan    The right middle lobe pulmonary nodule is hypermetabolic, favored to represent a primary bronchogenic carcinoma.Equivocal mediastinal nodes, similar to surrounding blood pool. Bilateral adrenal hypermetabolism, felt to be physiologic    10/24/2014 Pathology Results    Lung, needle/core biopsy(ies), RML - ADENOCARCINOMA, SEE COMMENT metastatic adenocarcinoma of a colorectal primary    10/24/2014 Relapse/Recurrence       11/16/2014 Definitive Surgery    Bronchoscopy, right video-assisted thoracoscopy, wedge resection of right  middle lobe by Dr. Servando Snare    11/16/2014 Pathology Results    Lung, wedge biopsy/resection, right lingula and small portion of middle lobe - METASTATIC ADENOCARCINOMA, CONSISTENT WITH COLORECTAL PRIMARY, SPANNING 2.0 CM. - THE SURGICAL RESECTION MARGINS ARE NEGATIVE FOR ADENOCARCINOMA.    11/16/2014 Remission    THE SURGICAL RESECTION MARGINS ARE NEGATIVE FOR ADENOCARCINOMA.    03/07/2015 Imaging    CT CAP- Interval resection of right middle lobe metastasis. No acute process or evidence of metastatic disease in the chest, abdomen or pelvis. Improved right upper lobe reticular nodular opacities are favored to represent resolving infection.    09/14/2015 Imaging    CT CAP- No findings of recurrent malignancy. No recurrence along the wedge resection site of the right middle lobe. Right anterior abdominal wall focal hernia containing a knuckle of small bowel without complicating feature.    06/13/2016 Imaging    CT CAP- 1. New right-sided pleural metastasis/mass. Other smaller right-sided pulmonary nodules which are all pleural-based and most likely represent pleural metastasis. 2. No convincing evidence of abdominopelvic nodal metastasis. 3. Constellation of findings, including pancreatic atrophy, duct dilatation, and pancreatic head soft tissue fullness which are highly suspicious for pancreatic adenocarcinoma. Metastatic disease felt much less likely. Consider endoscopic ultrasound sampling or ERCP. Cannot exclude superimposed acute pancreatitis. 4. New enlargement of the appendiceal tip with subtle surrounding edema. Cannot exclude early or mild appendicitis. 5. Coronary artery atherosclerosis. Aortic atherosclerosis. 6. 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    06/13/2016 Progression    CT scan demonstrates progression  of disease    06/19/2016 Procedure    EUS with FNA by Dr. Ardis Hughs    06/20/2016 Pathology Results    FINE NEEDLE ASPIRATION, ENDOSCOPIC,  PANCREAS UNCINATE AREA(SPECIMEN 1 OF 1 COLLECTED 06/19/16): MALIGNANT CELLS CONSISTENT WITH METASTATIC ADENOCARCINOMA.    06/25/2016 PET scan    1. Two pleural-based nodules in the right hemithorax are hypermetabolic. Metastatic disease is a distinct consideration. The scattered pulmonary parenchymal nodule seen on previous diagnostic CT imaging are below the threshold for reliable resolution on PET imaging. 2. Hypermetabolic lesion pancreatic head, consistent with neoplasm. As noted on prior CT, adenocarcinoma is any consideration. No evidence for hypermetabolic abdominal lymphadenopathy. Mottled uptake noted in the liver, but no discrete hepatic metastases are evident on PET imaging. 3. Appendix remains distended up to 0.9-10 mm diameter with a stone towards the tip in subtle periappendiceal edema/inflammation. The appendix is hypermetabolic along its length. Imaging features are relatively stable in the 12 day interval since prior CT scan. While appendicitis is a consideration, the relative stability over 12 days would be unusual for that etiology. Continued close follow-up recommended.    06/26/2016 Pathology Results    Not enough tissue for foundationONE or K-ras testing.    07/07/2016 Procedure    Port placed by Dr. Arnoldo Morale    07/08/2016 -  Chemotherapy    The patient had pegfilgrastim (NEULASTA) injection 6 mg, 6 mg, Subcutaneous,  Once, 0 of 4 cycles  ondansetron (ZOFRAN) IVPB 8 mg, 8 mg, Intravenous,  Once, 0 of 4 cycles  leucovorin 804 mg in dextrose 5 % 250 mL infusion, 400 mg/m2, Intravenous,  Once, 0 of 4 cycles  oxaliplatin (ELOXATIN) 170 mg in dextrose 5 % 500 mL chemo infusion, 85 mg/m2, Intravenous,  Once, 0 of 4 cycles  fluorouracil (ADRUCIL) 4,800 mg in sodium chloride 0.9 % 150 mL chemo infusion, 2,400 mg/m2 = 4,800 mg, Intravenous, 1 Day/Dose, 0 of 4 cycles  fluorouracil (ADRUCIL) chemo injection 800 mg, 400 mg/m2, Intravenous,  Once, 0 of 4  cycles  pegfilgrastim (NEULASTA) injection 6 mg, 6 mg, Subcutaneous,  Once, 2 of 2 cycles  ondansetron (ZOFRAN) IVPB 8 mg, 8 mg, Intravenous,  Once, 12 of 12 cycles  leucovorin 660 mg in dextrose 5 % 250 mL infusion, 660 mg (original dose ), Intravenous,  Once, 12 of 12 cycles Dose modification: 660 mg (Cycle 1)  oxaliplatin (ELOXATIN) 140 mg in dextrose 5 % 500 mL chemo infusion, 140 mg (original dose ), Intravenous,  Once, 12 of 12 cycles Dose modification: 140 mg (Cycle 1), 119 mg (85 % of original dose 140 mg, Cycle 8, Reason: Provider Judgment)  fluorouracil (ADRUCIL) 3,850 mg in sodium chloride 0.9 % 150 mL chemo infusion, 3,850 mg (original dose ), Intravenous, 1 Day/Dose, 12 of 12 cycles Dose modification: 3,850 mg (Cycle 1)  fluorouracil (ADRUCIL) chemo injection 700 mg, 700 mg (original dose ), Intravenous,  Once, 12 of 12 cycles Dose modification: 700 mg (Cycle 1)  palonosetron (ALOXI) injection 0.25 mg, 0.25 mg, Intravenous,  Once, 7 of 12 cycles Administration: 0.25 mg (09/30/2016)  bevacizumab (AVASTIN) 475 mg in sodium chloride 0.9 % 100 mL chemo infusion, 5 mg/kg = 475 mg, Intravenous,  Once, 6 of 11 cycles Administration: 500 mg (09/30/2016)  leucovorin 880 mg in dextrose 5 % 250 mL infusion, 400 mg/m2 = 880 mg, Intravenous,  Once, 7 of 12 cycles Administration: 880 mg (09/30/2016)  oxaliplatin (ELOXATIN) 185 mg in dextrose 5 % 500 mL chemo infusion, 85 mg/m2 =  185 mg, Intravenous,  Once, 7 of 12 cycles Administration: 185 mg (09/30/2016)  fluorouracil (ADRUCIL) chemo injection 900 mg, 400 mg/m2 = 900 mg, Intravenous,  Once, 7 of 12 cycles Administration: 900 mg (09/30/2016)  fluorouracil (ADRUCIL) 5,300 mg in sodium chloride 0.9 % 144 mL chemo infusion, 2,400 mg/m2 = 5,300 mg, Intravenous, 1 Day/Dose, 7 of 12 cycles Administration: 5,300 mg (09/30/2016)  for chemotherapy treatment.      08/27/2016 Imaging    CT CAP- 1. Pulmonary metastatic disease is stable. 2.  Pancreatic head mass, grossly stable, with progressive atrophy of the body and tail of the pancreas. Stable peripancreatic lymph node. 3. Mild circumferential rectal wall thickening, stable. 4. Aortic atherosclerosis (ICD10-170.0). Coronary artery calcification. 5. Hyperattenuating lesion off the lower pole left kidney, stable, too small to characterize. Continued attention on followup exams is warranted. 6. Persistent wall thickening and mild dilatation of the proximal appendix, of uncertain etiology. Continued attention on followup exams is warranted.    11/06/2016 Imaging    CT C/A/P: IMPRESSION: 1. Stable pulmonary metastatic disease. 2. Similar appearing pancreatic head mass. Progressed atrophy of the pancreatic body and tail. Stable peripancreatic lymph node. 3. Stable hypoattenuating lesion partially exophytic off the inferior pole of the left kidney. 4. Aortic atherosclerosis. 5. Persistent mild wall thickening and mild dilatation of the appendix.     01/30/2017 Imaging    Ct C/A/P: IMPRESSION: 1. Stable pulmonary metastatic disease. 2. Stable to slight increase in size of pancreatic head mass. 3.  Aortic Atherosclerosis (ICD10-I70.0). 4. Similar appearance of mild wall thickening of the appendix which contains an appendicolith.    05/01/2017 Imaging    CT C/A/P: Stable disease in the lungs, no progression of disease in the abdomen.      CANCER STAGING: Cancer Staging Rectal cancer ([place]) Staging form: Colon and Rectum, AJCC 7th Edition - Clinical: Stage IIIB (T3, N1, M0) - Signed by [name], MD on 02/04/2011 - Pathologic stage from 11/17/2014: Stage IVA (M1a) - Signed by Baird Cancer, PA-C on 09/14/2015    INTERVAL HISTORY:  Mr. Chatterjee 67 y.o. male returns for routine follow-up for rectal cancer. Patient is here today with his wife. He is tolerating treatment great with very few side effects. He has numbness in his legs and feet and hands. This  is stable at this time. He occasionally has leg swelling. He remains active and rarely sits down at home. He is always moving and doing something at this time. He reports his energy and appetite at 75% and he is maintaining his weight. Patient denies any new pains. Denies any nausea, vomiting, or diarrhea. Denies any fevers or recent infections. Denies any skin rashes or mouth sores.     REVIEW OF SYSTEMS:  Review of Systems  Cardiovascular: Positive for leg swelling.  Neurological: Positive for dizziness and numbness.  All other systems reviewed and are negative.    PAST MEDICAL/SURGICAL HISTORY:  Past Medical History:  Diagnosis Date  . Chronic anticoagulation   . Colon cancer ([place]) 07/24/2010   rectal ca, inv adenocarcinoma  . Depression 04/01/2011  . Diabetes mellitus without complication ([place])   . DVT (deep venous thrombosis) ([place]) 05/09/2011  . GERD (gastroesophageal reflux disease)   . High output ileostomy ([place]) 05/21/2011  . History of kidney stones   . HTN (hypertension)   . Hx of radiation therapy 09/02/10 to 10/14/10   pelvis  . Lung metastasis ([place])   . Neuropathy   . Peripheral vascular disease ([place])  dvt's,pe  . Pneumonia    hx x3  . Pulmonary embolism ([place])   . Rectal cancer ([place]) 01/15/2011   S/P radiation and concurrent 5-FU continuous infusion from 09/09/10- 10/10/10.  S/P proctectomy with colorectal anastomosis and diverting loop ileostomy on 11/14/10 at [facility] by Dr. Harlon Ditty. Pathology reveals a pT3b N1 with 3/20 lymph nodes.     Past Surgical History:  Procedure Laterality Date  . COLON SURGERY  11/14/2010   proctectomy with colorectal anastomosis and diverting loop ileostomy (temporary planned)  . COLONOSCOPY  07/2010   proximal rectal apple core mass 10-14cm from anal verge (adenocarcinoma), 2-3cm distal rectal carpet polyp s/p piecemeal snare polypectomy (adenoma)  . COLONOSCOPY  04/21/2012   RMR: Friable,fibrotic appearing colorectal anastomosis  producing some luminal narrowing-not felt to be critical. path: focal erosion with slight inflammation and hyperemia. SURVEILLANCE DUE DEC 2015  . COLONOSCOPY N/A 11/24/2013   Dr. Rourk:somewhat fibrotic/friable anastomotic mucosal-status post biopsy (narrowing not felt to be clinically significant) Single colonic diverticulum. benign polypoid rectal mucosa  . colostomy reversal  april 2013  . ESOPHAGOGASTRODUODENOSCOPY  07/2010   RMR: schatki ring s/p dilation, small hh, SB bx benign  . ESOPHAGOGASTRODUODENOSCOPY N/A 09/04/2015   Procedure: ESOPHAGOGASTRODUODENOSCOPY (EGD);  Surgeon: Daneil Dolin, MD;  Location: [facility] ENDO SUITE;  Service: Endoscopy;  Laterality: N/A;  730  . EUS  08/2010   Dr. Owens Loffler. uT3N0 circumferential, nearly obstruction rectosigmoid adenocarcinoma, distal edge 12cm from anal verge  . EUS N/A 06/19/2016   Procedure: UPPER ENDOSCOPIC ULTRASOUND (EUS) RADIAL;  Surgeon: Milus Banister, MD;  Location: [facility] ENDOSCOPY;  Service: Endoscopy;  Laterality: N/A;  . HERNIA REPAIR     abd hernia repair  . IVC filter    . ivc filter    . port a cath placement    . PORT-A-CATH REMOVAL  09/24/2011   Procedure: REMOVAL PORT-A-CATH;  Surgeon: Donato Heinz, MD;  Location: [facility] ORS;  Service: General;  Laterality: N/A;  Minor Room  . PORTACATH PLACEMENT Right 07/07/2016   Procedure: INSERTION PORT-A-CATH;  Surgeon: Aviva Signs, MD;  Location: [facility] ORS;  Service: General;  Laterality: Right;  Marland Kitchen VIDEO ASSISTED THORACOSCOPY (VATS)/WEDGE RESECTION Right 11/15/2014   Procedure: VIDEO ASSISTED THORACOSCOPY (VATS)/LUNG RESECTION WITH RIGHT LINGULECTOMY;  Surgeon: [name], MD;  Location: [facility];  Service: Thoracic;  Laterality: Right;  Marland Kitchen VIDEO BRONCHOSCOPY N/A 11/15/2014   Procedure: VIDEO BRONCHOSCOPY;  Surgeon: [name], MD;  Location: [facility] OR;  Service: Thoracic;  Laterality: N/A;     SOCIAL HISTORY:  Social History   Socioeconomic History  . Marital status: Married     Spouse name: Not on file  . Number of children: 2  . Years of education: Not on file  . Highest education level: Not on file  Occupational History  . Occupation: self-employed Regulatory affairs officer, currently not working    Fish farm manager: SELF EMPLOYED  Social Needs  . Financial resource strain: Not on file  . Food insecurity:    Worry: Not on file    Inability: Not on file  . Transportation needs:    Medical: Not on file    Non-medical: Not on file  Tobacco Use  . Smoking status: Never Smoker  . Smokeless tobacco: Never Used  Substance and Sexual Activity  . Alcohol use: No  . Drug use: No  . Sexual activity: Yes    Birth control/protection: None    Comment: married  Lifestyle  . Physical activity:  Days per week: Not on file    Minutes per session: Not on file  . Stress: Not on file  Relationships  . Social connections:    Talks on phone: Not on file    Gets together: Not on file    Attends religious service: Not on file    Active member of club or organization: Not on file    Attends meetings of clubs or organizations: Not on file    Relationship status: Not on file  . Intimate partner violence:    Fear of current or ex partner: Not on file    Emotionally abused: Not on file    Physically abused: Not on file    Forced sexual activity: Not on file  Other Topics Concern  . Not on file  Social History Narrative  . Not on file    FAMILY HISTORY:  Family History  Problem Relation Age of Onset  . Cancer Brother        throat  . Cancer Brother        prostate  . Colon cancer Neg Hx   . Liver disease Neg Hx   . Inflammatory bowel disease Neg Hx     CURRENT MEDICATIONS:  Outpatient Encounter Medications as of 01/05/2018  Medication Sig  . acetaminophen (TYLENOL) 500 MG tablet Take 1,000 mg by mouth every 6 (six) hours as needed.  . capecitabine (XELODA) 500 MG tablet TAKE 3 TABLETS BY MOUTH TWICE DAILY FOR 14 DAYS IN A ROW FOLLOWED BY A 7 DAY REST PERIOD.  TAKE AFTER MEAL  . clonazePAM (KLONOPIN) 0.5 MG tablet Take 0.5 mg by mouth 2 (two) times daily as needed.  . Diphenhyd-Hydrocort-Nystatin (FIRST-DUKES MOUTHWASH) SUSP Use as directed 5 mLs in the mouth or throat 4 (four) times daily.  Marland Kitchen gabapentin (NEURONTIN) 300 MG capsule Take 3 capsules (900 mg total) by mouth 2 (two) times daily.  Marland Kitchen glimepiride (AMARYL) 2 MG tablet Take 2 mg by mouth daily with breakfast.   . leucovorin in dextrose 5 % 250 mL Inject into the vein once.  Marland Kitchen lisinopril (PRINIVIL,ZESTRIL) 10 MG tablet   . magnesium oxide (MAG-OX) 400 (241.3 Mg) MG tablet Take 1 tablet (400 mg total) by mouth 2 (two) times daily.  . mirtazapine (REMERON) 7.5 MG tablet Take 7.5 mg by mouth at bedtime.  Marland Kitchen omeprazole (PRILOSEC) 20 MG capsule Take 20 mg by mouth 2 (two) times daily before a meal.   . ondansetron (ZOFRAN) 8 MG tablet Take 1 tablet (8 mg total) by mouth 2 (two) times daily as needed for refractory nausea / vomiting. Start on day 3 after chemotherapy.  . prochlorperazine (COMPAZINE) 10 MG tablet Take 1 tablet (10 mg total) by mouth every 6 (six) hours as needed (Nausea or vomiting).  . psyllium (REGULOID) 0.52 g capsule Take 0.52 g by mouth daily.  . rivaroxaban (XARELTO) 20 MG TABS tablet Take 1 tablet (20 mg total) by mouth daily with supper.  . tamsulosin (FLOMAX) 0.4 MG CAPS capsule Take 0.4 mg by mouth at bedtime.   . traZODone (DESYREL) 100 MG tablet Take 100 mg by mouth at bedtime.   Facility-Administered Encounter Medications as of 01/05/2018  Medication  . sodium chloride 0.9 % injection 10 mL  . sodium chloride flush (NS) 0.9 % injection 10 mL    ALLERGIES:  Allergies  Allergen Reactions  . Oxycodone     Blisters, hallucinations  . Tramadol     Blisters, hallucinations  . Trazodone And Nefazodone  Other (See Comments)    hallucinations     PHYSICAL EXAM:  ECOG Performance status: 1  Vitals:   01/05/18 1001  BP: (!) 157/75  Pulse: 84  Resp: 18  Temp: 98 F  (36.7 C)  SpO2: 100%   Filed Weights   01/05/18 1001  Weight: 209 lb 6.4 oz (95 kg)    Physical Exam  Constitutional: He is oriented to person, place, and time. He appears well-developed and well-nourished.  Cardiovascular: Normal rate, regular rhythm and normal heart sounds.  Pulmonary/Chest: Effort normal and breath sounds normal.  Neurological: He is alert and oriented to person, place, and time.  Skin: Skin is warm and dry.  Psychiatric: He has a normal mood and affect. His behavior is normal. Judgment and thought content normal.     LABORATORY DATA:  I have reviewed the labs as listed.  CBC    Component Value Date/Time   WBC 6.3 01/05/2018 0930   RBC 4.02 (L) 01/05/2018 0930   HGB 13.5 01/05/2018 0930   HGB 11.6 (L) 02/04/2011 1059   HCT 41.2 01/05/2018 0930   HCT 34.7 (L) 02/04/2011 1059   PLT 189 01/05/2018 0930   PLT 282 02/04/2011 1059   MCV 102.5 (H) 01/05/2018 0930   MCV 76.0 (L) 02/04/2011 1059   MCH 33.6 01/05/2018 0930   MCHC 32.8 01/05/2018 0930   RDW 17.4 (H) 01/05/2018 0930   RDW 18.5 (H) 02/04/2011 1059   LYMPHSABS 1.7 01/05/2018 0930   LYMPHSABS 1.1 02/04/2011 1059   MONOABS 0.5 01/05/2018 0930   MONOABS 0.5 02/04/2011 1059   EOSABS 0.2 01/05/2018 0930   EOSABS 0.1 02/04/2011 1059   BASOSABS 0.0 01/05/2018 0930   BASOSABS 0.1 02/04/2011 1059   CMP Latest Ref Rng & Units 01/05/2018 12/29/2017 12/22/2017  Glucose 70 - 99 mg/dL 223(H) 242(H) 258(H)  BUN 8 - 23 mg/dL '11 8 9  ' Creatinine 0.61 - 1.24 mg/dL 0.78 0.84 0.72  Sodium 135 - 145 mmol/L 141 142 141  Potassium 3.5 - 5.1 mmol/L 4.4 4.3 3.3(L)  Chloride 98 - 111 mmol/L 109 110 111  CO2 22 - 32 mmol/L 27 24 20(L)  Calcium 8.9 - 10.3 mg/dL 8.5(L) 8.7(L) 8.4(L)  Total Protein 6.5 - 8.1 g/dL 6.1(L) 6.1(L) 6.1(L)  Total Bilirubin 0.3 - 1.2 mg/dL 1.6(H) 1.8(H) 2.2(H)  Alkaline Phos 38 - 126 U/L 78 76 72  AST 15 - 41 U/L '23 27 23  ' ALT 0 - 44 U/L '23 21 19        ' ASSESSMENT & PLAN:   Rectal  cancer 1.  Stage IV rectal cancer with lung and pancreatic metastasis: -He was on maintenance 5-FU and bevacizumab.  He started Xeloda first cycle on 08/07/2017 and took 4 pills twice a day.  He is on a 2 weeks on 1 week of regimen.  He felt very tired after starting Xeloda.  He also had flulike symptoms.  His appetite has gone down. - We have cut back on Xeloda dose to 1500 mg twice a day during cycle 2.  He is tolerating that dose very well.  No issues with mucositis, hand-foot skin reaction or diarrhea.  No issues with the side effects from bevacizumab.  His blood pressure systolic was slightly high at 146.  He will follow-up with Dr. Woody Seller for optimal management.  His lisinopril is dosed at 40 mg daily. -Last CT scan of the chest, abdomen and pelvis was done on 11/23/2017 which showed stable disease.  CEA on  11/03/2017 was normal at 4.2. -he is tolerating the current regimen very well.  He is continuing to be very active.  He will proceed with bevacizumab today.  He will start his next cycle of Xeloda tomorrow.  No hand-foot skin reaction reported. - We will plan to repeat CT scans and end of October.  He will be seen back in 6 weeks.  2.  Neuropathy: He has numbness in the hands and feet.  He is taking gabapentin 900 mg twice daily.  This is well controlled.  3.  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  He will continue Xarelto 20 mg daily.  4. Rectal pain: He has hydrocodone 5 mg to be taken as needed.  He has not needed any pill in the past few weeks.   5.  Hypomagnesemia: He is taking magnesium once daily.  His magnesium today is normal at 1.9.       Orders placed this encounter:  Orders Placed This Encounter  Procedures  . CT Chest W Contrast  . CT Abdomen Pelvis W Contrast  . Magnesium  . CBC with Differential/Platelet  . Comprehensive metabolic panel  . CBC with Differential/Platelet  . Comprehensive metabolic panel      Derek Jack, MD [facility] [phone]

## 2018-01-05 NOTE — Patient Instructions (Signed)
[facility] at [facility]  Discharge Instructions:  Today you received your Avastin infusion. Follow up as scheduled. Call clinic for any questions or concerns. _______________________________________________________________  Thank you for choosing [facility] to provide your oncology and hematology care.  To afford each patient quality time with our providers, please arrive at least 15 minutes before your scheduled appointment.  You need to re-schedule your appointment if you arrive 10 or more minutes late.  We strive to give you quality time with our providers, and arriving late affects you and other patients whose appointments are after yours.  Also, if you no show three or more times for appointments you may be dismissed from the clinic.  Again, thank you for choosing [facility] hope is that these requests will allow you access to exceptional care and in a timely manner. _______________________________________________________________  If you have questions after your visit, please contact our office at [phone] between the hours of 8:30 a.m. and 5:00 p.m. Voicemails left after 4:30 p.m. will not be returned until the following business day. _______________________________________________________________  For prescription refill requests, have your pharmacy contact our office. _______________________________________________________________  Recommendations made by the consultant and any test results will be sent to your referring physician. _______________________________________________________________

## 2018-01-05 NOTE — Progress Notes (Signed)
1040 Lab and urine results reviewed with and pt seen by Dr. [name] today and pt approved for Avastin infusion per MD                                       Charles [name] tolerated Avastin infusion well without complaints or incident. Pt continues to take his Xeloda as prescribed without issues.VSS upon discharge. Pt discharged self ambulatory in satisfactory condition accompanied by his wife

## 2018-01-06 LAB — CEA: CEA: 5.2 ng/mL — ABNORMAL HIGH (ref 0.0–4.7)

## 2018-01-21 ENCOUNTER — Other Ambulatory Visit (HOSPITAL_COMMUNITY): Payer: Self-pay | Admitting: *Deleted

## 2018-01-21 DIAGNOSIS — C7800 Secondary malignant neoplasm of unspecified lung: Secondary | ICD-10-CM

## 2018-01-21 DIAGNOSIS — C20 Malignant neoplasm of rectum: Secondary | ICD-10-CM

## 2018-01-21 MED ORDER — CAPECITABINE 500 MG PO TABS: ORAL_TABLET | ORAL | 1 refills | Status: DC

## 2018-01-21 MED FILL — XELODA 500 MG TABLET: 500 | 21 days supply | Qty: 84 | Fill #0

## 2018-01-21 NOTE — Telephone Encounter (Signed)
Chart reviewed, xeloda refilled and called into pharmacy.

## 2018-01-25 ENCOUNTER — Other Ambulatory Visit (HOSPITAL_COMMUNITY): Payer: Self-pay | Admitting: *Deleted

## 2018-01-25 DIAGNOSIS — T451X5A Adverse effect of antineoplastic and immunosuppressive drugs, initial encounter: Principal | ICD-10-CM

## 2018-01-25 DIAGNOSIS — G620 Drug-induced polyneuropathy: Secondary | ICD-10-CM

## 2018-01-25 MED ORDER — GABAPENTIN 300 MG PO CAPS: 900.0000 mg | ORAL_CAPSULE | Freq: Two times a day (BID) | ORAL | 1 refills | Status: DC

## 2018-01-25 NOTE — Telephone Encounter (Signed)
Per last office note, Gabapentin is controlling pain, refills sent to pharmacy per patient request.

## 2018-01-26 ENCOUNTER — Other Ambulatory Visit (HOSPITAL_COMMUNITY): Payer: Medicare Other

## 2018-01-26 ENCOUNTER — Inpatient Hospital Stay (HOSPITAL_COMMUNITY): Payer: Medicare Other

## 2018-01-26 ENCOUNTER — Ambulatory Visit (HOSPITAL_COMMUNITY): Payer: Medicare Other

## 2018-01-26 ENCOUNTER — Encounter (HOSPITAL_COMMUNITY): Payer: Self-pay

## 2018-01-26 ENCOUNTER — Other Ambulatory Visit: Payer: Self-pay

## 2018-01-26 VITALS — BP 148/81 | HR 74 | Temp 98.1°F | Resp 18 | Wt 211.2 lb

## 2018-01-26 DIAGNOSIS — C7800 Secondary malignant neoplasm of unspecified lung: Secondary | ICD-10-CM | POA: Diagnosis not present

## 2018-01-26 DIAGNOSIS — C7889 Secondary malignant neoplasm of other digestive organs: Secondary | ICD-10-CM | POA: Diagnosis not present

## 2018-01-26 DIAGNOSIS — C20 Malignant neoplasm of rectum: Secondary | ICD-10-CM

## 2018-01-26 DIAGNOSIS — G893 Neoplasm related pain (acute) (chronic): Secondary | ICD-10-CM | POA: Diagnosis not present

## 2018-01-26 DIAGNOSIS — F419 Anxiety disorder, unspecified: Secondary | ICD-10-CM

## 2018-01-26 DIAGNOSIS — Z5112 Encounter for antineoplastic immunotherapy: Secondary | ICD-10-CM | POA: Diagnosis not present

## 2018-01-26 DIAGNOSIS — E1140 Type 2 diabetes mellitus with diabetic neuropathy, unspecified: Secondary | ICD-10-CM | POA: Diagnosis not present

## 2018-01-26 LAB — COMPREHENSIVE METABOLIC PANEL
ALT: 32 U/L (ref 0–44)
AST: 30 U/L (ref 15–41)
Albumin: 3.5 g/dL (ref 3.5–5.0)
Alkaline Phosphatase: 75 U/L (ref 38–126)
Anion gap: 7 (ref 5–15)
BUN: 11 mg/dL (ref 8–23)
CO2: 24 mmol/L (ref 22–32)
Calcium: 8.4 mg/dL — ABNORMAL LOW (ref 8.9–10.3)
Chloride: 110 mmol/L (ref 98–111)
Creatinine, Ser: 0.83 mg/dL (ref 0.61–1.24)
GFR calc Af Amer: >60 mL/min (ref >60)
GFR calc non Af Amer: >60 mL/min (ref >60)
Glucose, Bld: 199 mg/dL — ABNORMAL HIGH (ref 70–99)
Potassium: 4.3 mmol/L (ref 3.5–5.1)
Sodium: 141 mmol/L (ref 135–145)
Total Bilirubin: 1.7 mg/dL — ABNORMAL HIGH (ref 0.3–1.2)
Total Protein: 6.1 g/dL — ABNORMAL LOW (ref 6.5–8.1)

## 2018-01-26 LAB — CBC WITH DIFFERENTIAL/PLATELET
Basophils Absolute: 0 10*3/uL (ref 0.0–0.1)
Basophils Relative: 0 %
Eosinophils Absolute: 0.2 10*3/uL (ref 0.0–0.7)
Eosinophils Relative: 3 %
HCT: 40.4 % (ref 39.0–52.0)
Hemoglobin: 13.2 g/dL (ref 13.0–17.0)
Lymphocytes Relative: 28 %
Lymphs Abs: 1.7 10*3/uL (ref 0.7–4.0)
MCH: 33.6 pg (ref 26.0–34.0)
MCHC: 32.7 g/dL (ref 30.0–36.0)
MCV: 102.8 fL — ABNORMAL HIGH (ref 78.0–100.0)
Monocytes Absolute: 0.5 10*3/uL (ref 0.1–1.0)
Monocytes Relative: 9 %
Neutro Abs: 3.5 10*3/uL (ref 1.7–7.7)
Neutrophils Relative %: 60 %
Platelets: 190 10*3/uL (ref 150–400)
RBC: 3.93 MIL/uL — ABNORMAL LOW (ref 4.22–5.81)
RDW: 17.4 % — ABNORMAL HIGH (ref 11.5–15.5)
WBC: 5.9 10*3/uL (ref 4.0–10.5)

## 2018-01-26 LAB — URINALYSIS, DIPSTICK ONLY
Bilirubin Urine: NEGATIVE
Glucose, UA: NEGATIVE mg/dL
Hgb urine dipstick: NEGATIVE
Ketones, ur: NEGATIVE mg/dL
Leukocytes, UA: NEGATIVE
Nitrite: NEGATIVE
Protein, ur: NEGATIVE mg/dL
Specific Gravity, Urine: 1.018 (ref 1.005–1.030)
pH: 5 (ref 5.0–8.0)

## 2018-01-26 MED ORDER — HEPARIN SOD (PORK) LOCK FLUSH 100 UNIT/ML IV SOLN
500.0000 [IU] | Freq: Once | INTRAVENOUS | Status: AC | PRN
  Administered 2018-01-26: 500 [IU]

## 2018-01-26 MED ORDER — CLONAZEPAM 0.5 MG PO TABS: 0.5000 mg | ORAL_TABLET | Freq: Two times a day (BID) | ORAL | 0 refills | Status: DC | PRN

## 2018-01-26 MED ORDER — SODIUM CHLORIDE 0.9% FLUSH
10.0000 mL | INTRAVENOUS | Status: DC | PRN
  Administered 2018-01-26: 10 mL
  Filled 2018-01-26: qty 10

## 2018-01-26 MED ORDER — SODIUM CHLORIDE 0.9 % IV SOLN
700.0000 mg | Freq: Once | INTRAVENOUS | Status: AC
  Administered 2018-01-26: 700 mg via INTRAVENOUS
  Filled 2018-01-26: qty 12

## 2018-01-26 MED ORDER — SODIUM CHLORIDE 0.9 % IV SOLN
Freq: Once | INTRAVENOUS | Status: AC
  Administered 2018-01-26: 10:00:00 via INTRAVENOUS

## 2018-01-26 NOTE — Progress Notes (Signed)
Treatment given per orders. Patient tolerated it well without problems. Vitals stable and discharged home from clinic ambulatory. Follow up as scheduled.  

## 2018-01-26 NOTE — Patient Instructions (Signed)
[facility] Discharge Instructions for Patients Receiving Chemotherapy   Beginning January 23rd 2017 lab work for the Cancer Center will be done in the  Main lab at [place] on 1st floor. If you have a lab appointment with the Cancer Center please come in thru the  Main Entrance and check in at the main information desk   Today you received the following chemotherapy agents   To help prevent nausea and vomiting after your treatment, we encourage you to take your nausea medication     If you develop nausea and vomiting, or diarrhea that is not controlled by your medication, call the clinic.  The clinic phone number is [phone]. Office hours are Monday-Friday 8:30am-5:00pm.  BELOW ARE SYMPTOMS THAT SHOULD BE REPORTED IMMEDIATELY:  *FEVER GREATER THAN 101.0 F  *CHILLS WITH OR WITHOUT FEVER  NAUSEA AND VOMITING THAT IS NOT CONTROLLED WITH YOUR NAUSEA MEDICATION  *UNUSUAL SHORTNESS OF BREATH  *UNUSUAL BRUISING OR BLEEDING  TENDERNESS IN MOUTH AND THROAT WITH OR WITHOUT PRESENCE OF ULCERS  *URINARY PROBLEMS  *BOWEL PROBLEMS  UNUSUAL RASH Items with * indicate a potential emergency and should be followed up as soon as possible. If you have an emergency after office hours please contact your primary care physician or go to the nearest emergency department.  Please call the clinic during office hours if you have any questions or concerns.   You may also contact the Patient Navigator at [phone] should you have any questions or need assistance in obtaining follow up care.      Resources For Cancer Patients and their Caregivers ? American [employer] Society: Can assist with transportation, wigs, general needs, runs Look Good Feel Better.        1-888-227-6333 ? Cancer Care: Provides financial assistance, online support groups, medication/co-pay assistance.  1-800-813-HOPE (4673) ? [facility] Assists [place] cancer  patients and their families through emotional , educational and financial support.  336-427-4357 ? [place] DSS Where to apply for food stamps, Medicaid and utility assistance. 336-342-1394 ? [facility]: Transportation to medical appointments. 336-347-2287 ? Social Security Administration: May apply for disability if have a Stage IV cancer. 336-342-7796 1-800-772-1213 ? [place] [facility], [facility] Transit [facility]: Assists with nutrition, care and transit needs. 336-349-2343         

## 2018-02-10 ENCOUNTER — Ambulatory Visit (HOSPITAL_COMMUNITY)
Admission: RE | Admit: 2018-02-10 | Discharge: 2018-02-10 | Disposition: A | Discharge status: Home / Self Care | Payer: Medicare Other | Source: Ambulatory Visit | Attending: Nurse Practitioner | Admitting: Nurse Practitioner

## 2018-02-10 DIAGNOSIS — K760 Fatty (change of) liver, not elsewhere classified: Secondary | ICD-10-CM | POA: Insufficient documentation

## 2018-02-10 DIAGNOSIS — R918 Other nonspecific abnormal finding of lung field: Secondary | ICD-10-CM | POA: Insufficient documentation

## 2018-02-10 DIAGNOSIS — C20 Malignant neoplasm of rectum: Secondary | ICD-10-CM | POA: Insufficient documentation

## 2018-02-10 DIAGNOSIS — I700 Atherosclerosis of aorta: Secondary | ICD-10-CM | POA: Insufficient documentation

## 2018-02-10 DIAGNOSIS — C7800 Secondary malignant neoplasm of unspecified lung: Secondary | ICD-10-CM | POA: Insufficient documentation

## 2018-02-10 DIAGNOSIS — K439 Ventral hernia without obstruction or gangrene: Secondary | ICD-10-CM | POA: Diagnosis not present

## 2018-02-10 DIAGNOSIS — K8689 Other specified diseases of pancreas: Secondary | ICD-10-CM | POA: Diagnosis not present

## 2018-02-10 DIAGNOSIS — Z23 Encounter for immunization: Secondary | ICD-10-CM | POA: Diagnosis not present

## 2018-02-10 DIAGNOSIS — Z299 Encounter for prophylactic measures, unspecified: Secondary | ICD-10-CM | POA: Diagnosis not present

## 2018-02-10 DIAGNOSIS — I10 Essential (primary) hypertension: Secondary | ICD-10-CM | POA: Diagnosis not present

## 2018-02-10 DIAGNOSIS — I82409 Acute embolism and thrombosis of unspecified deep veins of unspecified lower extremity: Secondary | ICD-10-CM | POA: Diagnosis not present

## 2018-02-10 DIAGNOSIS — E1165 Type 2 diabetes mellitus with hyperglycemia: Secondary | ICD-10-CM | POA: Diagnosis not present

## 2018-02-10 DIAGNOSIS — C189 Malignant neoplasm of colon, unspecified: Secondary | ICD-10-CM | POA: Diagnosis not present

## 2018-02-10 DIAGNOSIS — C218 Malignant neoplasm of overlapping sites of rectum, anus and anal canal: Secondary | ICD-10-CM | POA: Diagnosis not present

## 2018-02-10 DIAGNOSIS — J069 Acute upper respiratory infection, unspecified: Secondary | ICD-10-CM | POA: Diagnosis not present

## 2018-02-10 MED ORDER — IOPAMIDOL (ISOVUE-300) INJECTION 61%
100.0000 mL | Freq: Once | INTRAVENOUS | Status: AC | PRN
  Administered 2018-02-10: 100 mL via INTRAVENOUS

## 2018-02-11 ENCOUNTER — Other Ambulatory Visit (HOSPITAL_COMMUNITY): Payer: Self-pay | Admitting: Nurse Practitioner

## 2018-02-11 DIAGNOSIS — C7800 Secondary malignant neoplasm of unspecified lung: Secondary | ICD-10-CM

## 2018-02-11 DIAGNOSIS — C20 Malignant neoplasm of rectum: Secondary | ICD-10-CM

## 2018-02-11 MED ORDER — CAPECITABINE 500 MG PO TABS: ORAL_TABLET | ORAL | 1 refills | Status: DC

## 2018-02-12 ENCOUNTER — Telehealth (HOSPITAL_COMMUNITY): Payer: Self-pay | Admitting: Pharmacist

## 2018-02-12 NOTE — Telephone Encounter (Signed)
Oral Oncology Pharmacist Encounter  Prescription assessment for Xeloda (capecitabine) for the treatment of Stage IV rectal cancer in conjunction with bevacizumab, planned duration until disease progression or unacceptable drug toxicity. This is a current therapy for Charles [name].   CMP from 01/26/18 assessed, until disease progression or unacceptable drug toxicity. Prescription dose and frequency assessed.   Current medication list in Epic reviewed, one DDIs with capecitabine identified: - Omeprazole: Proton Pump Inhibitors (PPI) may diminish the therapeutic effect of capecitabine. Recommend evaluating the need for a PPI/acid suppression. If acid suppression is needed, recommend switching to a H2 antagonist (eg, ranitidine) to avoid this DDI.   Pt is fillingthis medication at [place].  Darl Pikes, PharmD, BCPS, [facility] Hematology/Oncology Clinical Pharmacist [facility] [phone]  02/12/2018 2:43 PM

## 2018-02-15 MED FILL — XELODA 500 MG TABLET: 500 | 21 days supply | Qty: 84 | Fill #0

## 2018-02-15 NOTE — Telephone Encounter (Signed)
Oral Chemotherapy Pharmacist Encounter  I spoke with patient's wife for overview of refill prescription for Xeloda (capecitabine) for the treatment of Stage IV rectal cancer in conjunction with bevacizumab, planned duration until disease progression or unacceptable drug toxicity. This is a current therapy for Mr. Flink.   Reviewed administration, dosing, side effects, monitoring, drug-food interactions, safe handling, storage, and disposal. Patient will take 3 TABLETS BY MOUTH TWICE DAILY FOR 14 DAYS IN A ROW FOLLOWED BY A 7 DAY REST PERIOD. TAKE AFTER MEAL.   He has been tolerating his Xeloda fairly well.     Reviewed with patient importance of keeping a medication schedule and plan for any missed doses.   Ms. Nhan voiced understanding and appreciation. All questions answered.   Patient knows to call the office with questions or concerns. Oral Oncology Clinic will continue to follow.  Darl Pikes, PharmD, BCPS, [facility] Hematology/Oncology Clinical Pharmacist [facility] [phone]  02/15/2018 12:21 PM

## 2018-02-15 NOTE — Telephone Encounter (Signed)
Oral Chemotherapy Pharmacist Encounter   Attempted to call patient on 10/11 and today 10/14 to set-up Xeloda shipment. Will continue to attempt to reach patient.  Darl Pikes, PharmD, BCPS, [facility] Hematology/Oncology Clinical Pharmacist [facility] [phone]  02/15/2018 11:00 AM

## 2018-02-16 ENCOUNTER — Encounter (HOSPITAL_COMMUNITY): Payer: Self-pay | Admitting: Hematology

## 2018-02-16 ENCOUNTER — Inpatient Hospital Stay (HOSPITAL_BASED_OUTPATIENT_CLINIC_OR_DEPARTMENT_OTHER): Payer: Medicare Other | Admitting: Hematology

## 2018-02-16 ENCOUNTER — Inpatient Hospital Stay (HOSPITAL_COMMUNITY): Payer: Medicare Other

## 2018-02-16 ENCOUNTER — Inpatient Hospital Stay (HOSPITAL_COMMUNITY): Payer: Medicare Other | Attending: Hematology

## 2018-02-16 VITALS — BP 130/75 | HR 64 | Temp 98.0°F | Resp 18 | Wt 208.8 lb

## 2018-02-16 DIAGNOSIS — G893 Neoplasm related pain (acute) (chronic): Secondary | ICD-10-CM | POA: Insufficient documentation

## 2018-02-16 DIAGNOSIS — Z9221 Personal history of antineoplastic chemotherapy: Secondary | ICD-10-CM

## 2018-02-16 DIAGNOSIS — C20 Malignant neoplasm of rectum: Secondary | ICD-10-CM | POA: Insufficient documentation

## 2018-02-16 DIAGNOSIS — Z5112 Encounter for antineoplastic immunotherapy: Secondary | ICD-10-CM | POA: Diagnosis not present

## 2018-02-16 DIAGNOSIS — Z923 Personal history of irradiation: Secondary | ICD-10-CM | POA: Diagnosis not present

## 2018-02-16 DIAGNOSIS — Z7901 Long term (current) use of anticoagulants: Secondary | ICD-10-CM

## 2018-02-16 DIAGNOSIS — Z86711 Personal history of pulmonary embolism: Secondary | ICD-10-CM | POA: Insufficient documentation

## 2018-02-16 DIAGNOSIS — Z79899 Other long term (current) drug therapy: Secondary | ICD-10-CM | POA: Insufficient documentation

## 2018-02-16 DIAGNOSIS — Z86718 Personal history of other venous thrombosis and embolism: Secondary | ICD-10-CM | POA: Insufficient documentation

## 2018-02-16 DIAGNOSIS — C7889 Secondary malignant neoplasm of other digestive organs: Secondary | ICD-10-CM | POA: Diagnosis not present

## 2018-02-16 DIAGNOSIS — E1140 Type 2 diabetes mellitus with diabetic neuropathy, unspecified: Secondary | ICD-10-CM

## 2018-02-16 DIAGNOSIS — C187 Malignant neoplasm of sigmoid colon: Secondary | ICD-10-CM

## 2018-02-16 DIAGNOSIS — C7800 Secondary malignant neoplasm of unspecified lung: Secondary | ICD-10-CM

## 2018-02-16 LAB — URINALYSIS, DIPSTICK ONLY
Bilirubin Urine: NEGATIVE
Glucose, UA: NEGATIVE mg/dL
Hgb urine dipstick: NEGATIVE
Ketones, ur: NEGATIVE mg/dL
Leukocytes, UA: NEGATIVE
Nitrite: NEGATIVE
Protein, ur: NEGATIVE mg/dL
Specific Gravity, Urine: 1.024 (ref 1.005–1.030)
pH: 5 (ref 5.0–8.0)

## 2018-02-16 LAB — COMPREHENSIVE METABOLIC PANEL
ALT: 27 U/L (ref 0–44)
AST: 30 U/L (ref 15–41)
Albumin: 3.6 g/dL (ref 3.5–5.0)
Alkaline Phosphatase: 85 U/L (ref 38–126)
Anion gap: 6 (ref 5–15)
BUN: 12 mg/dL (ref 8–23)
CO2: 26 mmol/L (ref 22–32)
Calcium: 8.6 mg/dL — ABNORMAL LOW (ref 8.9–10.3)
Chloride: 110 mmol/L (ref 98–111)
Creatinine, Ser: 0.75 mg/dL (ref 0.61–1.24)
GFR calc Af Amer: >60 mL/min (ref >60)
GFR calc non Af Amer: >60 mL/min (ref >60)
Glucose, Bld: 217 mg/dL — ABNORMAL HIGH (ref 70–99)
Potassium: 4.6 mmol/L (ref 3.5–5.1)
Sodium: 142 mmol/L (ref 135–145)
Total Bilirubin: 1.4 mg/dL — ABNORMAL HIGH (ref 0.3–1.2)
Total Protein: 6.4 g/dL — ABNORMAL LOW (ref 6.5–8.1)

## 2018-02-16 LAB — CBC WITH DIFFERENTIAL/PLATELET
Abs Immature Granulocytes: 0.01 10*3/uL (ref 0.00–0.07)
Basophils Absolute: 0 10*3/uL (ref 0.0–0.1)
Basophils Relative: 0 %
Eosinophils Absolute: 0.2 10*3/uL (ref 0.0–0.5)
Eosinophils Relative: 3 %
HCT: 41.1 % (ref 39.0–52.0)
Hemoglobin: 13 g/dL (ref 13.0–17.0)
Immature Granulocytes: 0 %
Lymphocytes Relative: 33 %
Lymphs Abs: 1.8 10*3/uL (ref 0.7–4.0)
MCH: 32.7 pg (ref 26.0–34.0)
MCHC: 31.6 g/dL (ref 30.0–36.0)
MCV: 103.5 fL — ABNORMAL HIGH (ref 80.0–100.0)
Monocytes Absolute: 0.4 10*3/uL (ref 0.1–1.0)
Monocytes Relative: 7 %
Neutro Abs: 3.1 10*3/uL (ref 1.7–7.7)
Neutrophils Relative %: 57 %
Platelets: 200 10*3/uL (ref 150–400)
RBC: 3.97 MIL/uL — ABNORMAL LOW (ref 4.22–5.81)
RDW: 17.2 % — ABNORMAL HIGH (ref 11.5–15.5)
WBC: 5.5 10*3/uL (ref 4.0–10.5)
nRBC: 0 % (ref 0.0–0.2)

## 2018-02-16 LAB — MAGNESIUM: Magnesium: 1.8 mg/dL (ref 1.7–2.4)

## 2018-02-16 MED ORDER — SODIUM CHLORIDE 0.9% FLUSH: 10.0000 mL | INTRAVENOUS | Status: DC | PRN

## 2018-02-16 MED ORDER — HEPARIN SOD (PORK) LOCK FLUSH 100 UNIT/ML IV SOLN
500.0000 [IU] | Freq: Once | INTRAVENOUS | Status: AC | PRN
  Administered 2018-02-16: 500 [IU]

## 2018-02-16 MED ORDER — SODIUM CHLORIDE 0.9 % IV SOLN
Freq: Once | INTRAVENOUS | Status: AC
  Administered 2018-02-16: 12:00:00 via INTRAVENOUS

## 2018-02-16 MED ORDER — SODIUM CHLORIDE 0.9 % IV SOLN
700.0000 mg | Freq: Once | INTRAVENOUS | Status: AC
  Administered 2018-02-16: 700 mg via INTRAVENOUS
  Filled 2018-02-16: qty 12

## 2018-02-16 NOTE — Patient Instructions (Signed)
[facility] Discharge Instructions for Patients Receiving Chemotherapy  Today you received the following chemotherapy agents   To help prevent nausea and vomiting after your treatment, we encourage you to take your nausea medication   If you develop nausea and vomiting that is not controlled by your nausea medication, call the clinic.   BELOW ARE SYMPTOMS THAT SHOULD BE REPORTED IMMEDIATELY:  *FEVER GREATER THAN 100.5 F  *CHILLS WITH OR WITHOUT FEVER  NAUSEA AND VOMITING THAT IS NOT CONTROLLED WITH YOUR NAUSEA MEDICATION  *UNUSUAL SHORTNESS OF BREATH  *UNUSUAL BRUISING OR BLEEDING  TENDERNESS IN MOUTH AND THROAT WITH OR WITHOUT PRESENCE OF ULCERS  *URINARY PROBLEMS  *BOWEL PROBLEMS  UNUSUAL RASH Items with * indicate a potential emergency and should be followed up as soon as possible.  Feel free to call the clinic should you have any questions or concerns. The clinic phone number is [phone].  Please show the CHEMO ALERT CARD at check-in to the Emergency Department and triage nurse.   

## 2018-02-16 NOTE — Progress Notes (Signed)
[place], [place] [address]   CLINIC:  Medical Oncology/Hematology  PCP:  [name], MD [place]  [phone]   REASON FOR VISIT: Follow-up for rectal cancer  CURRENT THERAPY: Xeloda tabs 2 weeks on 1 week off and Bevacizumab  BRIEF ONCOLOGIC HISTORY:    Rectal cancer ([place])   07/24/2010 Initial Diagnosis    Invasive adenocarcinoma of rectum    09/09/2010 Concurrent Chemotherapy    S/P radiation and concurrent 5-FU continuous infusion from 09/09/10- 10/10/10.    11/14/2010 Surgery    S/P proctectomy with colorectal anastomosis and diverting loop ileostomy on 11/14/10 at [facility] by Dr. Harlon Ditty. Pathology reveals a pT3b N1 with 3/20 lymph nodes.    02/05/2011 - 07/14/2011 Chemotherapy    FOLFOX    08/18/2011 Surgery    Approximate date of surgery- [place] by Dr. Harlon Ditty     Remission       11/24/2013 Survivorship    Colonoscopy- somewhat fibrotic/friable anastomotic mucosal-status post biopsy (narrowing not felt to be clinically significant).  Negative pathology for malignancy    09/12/2014 Imaging    DVT in the left femoral venous system, left common iliac vein, IVC, and within the IVC filter Right lower extremity venography confirms chronic occlusion of the femoral venous system with collateralization. The right iliac venous system is patent and do    09/25/2014 PET scan    The right middle lobe pulmonary nodule is hypermetabolic, favored to represent a primary bronchogenic carcinoma.Equivocal mediastinal nodes, similar to surrounding blood pool. Bilateral adrenal hypermetabolism, felt to be physiologic    10/24/2014 Pathology Results    Lung, needle/core biopsy(ies), RML - ADENOCARCINOMA, SEE COMMENT metastatic adenocarcinoma of a colorectal primary    10/24/2014 Relapse/Recurrence       11/16/2014 Definitive Surgery    Bronchoscopy, right video-assisted thoracoscopy, wedge resection of right middle  lobe by Dr. Servando Snare    11/16/2014 Pathology Results    Lung, wedge biopsy/resection, right lingula and small portion of middle lobe - METASTATIC ADENOCARCINOMA, CONSISTENT WITH COLORECTAL PRIMARY, SPANNING 2.0 CM. - THE SURGICAL RESECTION MARGINS ARE NEGATIVE FOR ADENOCARCINOMA.    11/16/2014 Remission    THE SURGICAL RESECTION MARGINS ARE NEGATIVE FOR ADENOCARCINOMA.    03/07/2015 Imaging    CT CAP- Interval resection of right middle lobe metastasis. No acute process or evidence of metastatic disease in the chest, abdomen or pelvis. Improved right upper lobe reticular nodular opacities are favored to represent resolving infection.    09/14/2015 Imaging    CT CAP- No findings of recurrent malignancy. No recurrence along the wedge resection site of the right middle lobe. Right anterior abdominal wall focal hernia containing a knuckle of small bowel without complicating feature.    06/13/2016 Imaging    CT CAP- 1. New right-sided pleural metastasis/mass. Other smaller right-sided pulmonary nodules which are all pleural-based and most likely represent pleural metastasis. 2. No convincing evidence of abdominopelvic nodal metastasis. 3. Constellation of findings, including pancreatic atrophy, duct dilatation, and pancreatic head soft tissue fullness which are highly suspicious for pancreatic adenocarcinoma. Metastatic disease felt much less likely. Consider endoscopic ultrasound sampling or ERCP. Cannot exclude superimposed acute pancreatitis. 4. New enlargement of the appendiceal tip with subtle surrounding edema. Cannot exclude early or mild appendicitis. 5. Coronary artery atherosclerosis. Aortic atherosclerosis. 6. 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    06/13/2016 Progression    CT scan demonstrates progression of  disease    06/19/2016 Procedure    EUS with FNA by Dr. Ardis Hughs    06/20/2016 Pathology Results    FINE NEEDLE ASPIRATION, ENDOSCOPIC, PANCREAS  UNCINATE AREA(SPECIMEN 1 OF 1 COLLECTED 06/19/16): MALIGNANT CELLS CONSISTENT WITH METASTATIC ADENOCARCINOMA.    06/25/2016 PET scan    1. Two pleural-based nodules in the right hemithorax are hypermetabolic. Metastatic disease is a distinct consideration. The scattered pulmonary parenchymal nodule seen on previous diagnostic CT imaging are below the threshold for reliable resolution on PET imaging. 2. Hypermetabolic lesion pancreatic head, consistent with neoplasm. As noted on prior CT, adenocarcinoma is any consideration. No evidence for hypermetabolic abdominal lymphadenopathy. Mottled uptake noted in the liver, but no discrete hepatic metastases are evident on PET imaging. 3. Appendix remains distended up to 0.9-10 mm diameter with a stone towards the tip in subtle periappendiceal edema/inflammation. The appendix is hypermetabolic along its length. Imaging features are relatively stable in the 12 day interval since prior CT scan. While appendicitis is a consideration, the relative stability over 12 days would be unusual for that etiology. Continued close follow-up recommended.    06/26/2016 Pathology Results    Not enough tissue for foundationONE or K-ras testing.    07/07/2016 Procedure    Port placed by Dr. Arnoldo Morale    07/08/2016 -  Chemotherapy    The patient had pegfilgrastim (NEULASTA) injection 6 mg, 6 mg, Subcutaneous,  Once, 0 of 4 cycles  ondansetron (ZOFRAN) IVPB 8 mg, 8 mg, Intravenous,  Once, 0 of 4 cycles  leucovorin 804 mg in dextrose 5 % 250 mL infusion, 400 mg/m2, Intravenous,  Once, 0 of 4 cycles  oxaliplatin (ELOXATIN) 170 mg in dextrose 5 % 500 mL chemo infusion, 85 mg/m2, Intravenous,  Once, 0 of 4 cycles  fluorouracil (ADRUCIL) 4,800 mg in sodium chloride 0.9 % 150 mL chemo infusion, 2,400 mg/m2 = 4,800 mg, Intravenous, 1 Day/Dose, 0 of 4 cycles  fluorouracil (ADRUCIL) chemo injection 800 mg, 400 mg/m2, Intravenous,  Once, 0 of 4 cycles  pegfilgrastim  (NEULASTA) injection 6 mg, 6 mg, Subcutaneous,  Once, 2 of 2 cycles  ondansetron (ZOFRAN) IVPB 8 mg, 8 mg, Intravenous,  Once, 12 of 12 cycles  leucovorin 660 mg in dextrose 5 % 250 mL infusion, 660 mg (original dose ), Intravenous,  Once, 12 of 12 cycles Dose modification: 660 mg (Cycle 1)  oxaliplatin (ELOXATIN) 140 mg in dextrose 5 % 500 mL chemo infusion, 140 mg (original dose ), Intravenous,  Once, 12 of 12 cycles Dose modification: 140 mg (Cycle 1), 119 mg (85 % of original dose 140 mg, Cycle 8, Reason: Provider Judgment)  fluorouracil (ADRUCIL) 3,850 mg in sodium chloride 0.9 % 150 mL chemo infusion, 3,850 mg (original dose ), Intravenous, 1 Day/Dose, 12 of 12 cycles Dose modification: 3,850 mg (Cycle 1)  fluorouracil (ADRUCIL) chemo injection 700 mg, 700 mg (original dose ), Intravenous,  Once, 12 of 12 cycles Dose modification: 700 mg (Cycle 1)  palonosetron (ALOXI) injection 0.25 mg, 0.25 mg, Intravenous,  Once, 7 of 12 cycles Administration: 0.25 mg (09/30/2016)  bevacizumab (AVASTIN) 475 mg in sodium chloride 0.9 % 100 mL chemo infusion, 5 mg/kg = 475 mg, Intravenous,  Once, 6 of 11 cycles Administration: 500 mg (09/30/2016)  leucovorin 880 mg in dextrose 5 % 250 mL infusion, 400 mg/m2 = 880 mg, Intravenous,  Once, 7 of 12 cycles Administration: 880 mg (09/30/2016)  oxaliplatin (ELOXATIN) 185 mg in dextrose 5 % 500 mL chemo infusion, 85 mg/m2 = 185  mg, Intravenous,  Once, 7 of 12 cycles Administration: 185 mg (09/30/2016)  fluorouracil (ADRUCIL) chemo injection 900 mg, 400 mg/m2 = 900 mg, Intravenous,  Once, 7 of 12 cycles Administration: 900 mg (09/30/2016)  fluorouracil (ADRUCIL) 5,300 mg in sodium chloride 0.9 % 144 mL chemo infusion, 2,400 mg/m2 = 5,300 mg, Intravenous, 1 Day/Dose, 7 of 12 cycles Administration: 5,300 mg (09/30/2016)  for chemotherapy treatment.      08/27/2016 Imaging    CT CAP- 1. Pulmonary metastatic disease is stable. 2. Pancreatic head mass,  grossly stable, with progressive atrophy of the body and tail of the pancreas. Stable peripancreatic lymph node. 3. Mild circumferential rectal wall thickening, stable. 4. Aortic atherosclerosis (ICD10-170.0). Coronary artery calcification. 5. Hyperattenuating lesion off the lower pole left kidney, stable, too small to characterize. Continued attention on followup exams is warranted. 6. Persistent wall thickening and mild dilatation of the proximal appendix, of uncertain etiology. Continued attention on followup exams is warranted.    11/06/2016 Imaging    CT C/A/P: IMPRESSION: 1. Stable pulmonary metastatic disease. 2. Similar appearing pancreatic head mass. Progressed atrophy of the pancreatic body and tail. Stable peripancreatic lymph node. 3. Stable hypoattenuating lesion partially exophytic off the inferior pole of the left kidney. 4. Aortic atherosclerosis. 5. Persistent mild wall thickening and mild dilatation of the appendix.     01/30/2017 Imaging    Ct C/A/P: IMPRESSION: 1. Stable pulmonary metastatic disease. 2. Stable to slight increase in size of pancreatic head mass. 3.  Aortic Atherosclerosis (ICD10-I70.0). 4. Similar appearance of mild wall thickening of the appendix which contains an appendicolith.    05/01/2017 Imaging    CT C/A/P: Stable disease in the lungs, no progression of disease in the abdomen.      CANCER STAGING: Cancer Staging Rectal cancer ([place]) Staging form: Colon and Rectum, AJCC 7th Edition - Clinical: Stage IIIB (T3, N1, M0) - Signed by [name], MD on 02/04/2011 - Pathologic stage from 11/17/2014: Stage IVA (M1a) - Signed by Baird Cancer, PA-C on 09/14/2015    INTERVAL HISTORY:  Charles [name] 67 y.o. male returns for routine follow-up for rectal cancer. He is here today with his wife. He is doing well tolerating his treatment. He has had a cold with chest congestion and cough and is currently taking antibiotics. He reports  a light rash on his arms with mild itching. It has improved since his last treatment. He has intermittent dizziness. He reports his appetite and energy level are 75% and he is maintaining his weight at this time. He denies any new pains. Denies any nausea, vomiting, or diarrhea. Denies any mouth sores. He tries to remain active at home and moves around as much as he can.     REVIEW OF SYSTEMS:  Review of Systems  Cardiovascular: Positive for leg swelling.  Skin: Positive for itching and rash.  Neurological: Positive for dizziness.  All other systems reviewed and are negative.    PAST MEDICAL/SURGICAL HISTORY:  Past Medical History:  Diagnosis Date  . Chronic anticoagulation   . Colon cancer ([place]) 07/24/2010   rectal ca, inv adenocarcinoma  . Depression 04/01/2011  . Diabetes mellitus without complication ([place])   . DVT (deep venous thrombosis) ([place]) 05/09/2011  . GERD (gastroesophageal reflux disease)   . High output ileostomy ([place]) 05/21/2011  . History of kidney stones   . HTN (hypertension)   . Hx of radiation therapy 09/02/10 to 10/14/10   pelvis  . Lung metastasis ([place])   .  Neuropathy   . Peripheral vascular disease ([place])    dvt's,pe  . Pneumonia    hx x3  . Pulmonary embolism ([place])   . Rectal cancer ([place]) 01/15/2011   S/P radiation and concurrent 5-FU continuous infusion from 09/09/10- 10/10/10.  S/P proctectomy with colorectal anastomosis and diverting loop ileostomy on 11/14/10 at [facility] by Dr. Harlon Ditty. Pathology reveals a pT3b N1 with 3/20 lymph nodes.     Past Surgical History:  Procedure Laterality Date  . COLON SURGERY  11/14/2010   proctectomy with colorectal anastomosis and diverting loop ileostomy (temporary planned)  . COLONOSCOPY  07/2010   proximal rectal apple core mass 10-14cm from anal verge (adenocarcinoma), 2-3cm distal rectal carpet polyp s/p piecemeal snare polypectomy (adenoma)  . COLONOSCOPY  04/21/2012   RMR: Friable,fibrotic appearing colorectal  anastomosis producing some luminal narrowing-not felt to be critical. path: focal erosion with slight inflammation and hyperemia. SURVEILLANCE DUE DEC 2015  . COLONOSCOPY N/A 11/24/2013   Dr. Rourk:somewhat fibrotic/friable anastomotic mucosal-status post biopsy (narrowing not felt to be clinically significant) Single colonic diverticulum. benign polypoid rectal mucosa  . colostomy reversal  april 2013  . ESOPHAGOGASTRODUODENOSCOPY  07/2010   RMR: schatki ring s/p dilation, small hh, SB bx benign  . ESOPHAGOGASTRODUODENOSCOPY N/A 09/04/2015   Procedure: ESOPHAGOGASTRODUODENOSCOPY (EGD);  Surgeon: Daneil Dolin, MD;  Location: [facility];  Service: Endoscopy;  Laterality: N/A;  730  . EUS  08/2010   Dr. Owens Loffler. uT3N0 circumferential, nearly obstruction rectosigmoid adenocarcinoma, distal edge 12cm from anal verge  . EUS N/A 06/19/2016   Procedure: UPPER ENDOSCOPIC ULTRASOUND (EUS) RADIAL;  Surgeon: Milus Banister, MD;  Location: [facility];  Service: Endoscopy;  Laterality: N/A;  . HERNIA REPAIR     abd hernia repair  . IVC filter    . ivc filter    . port a cath placement    . PORT-A-CATH REMOVAL  09/24/2011   Procedure: REMOVAL PORT-A-CATH;  Surgeon: Donato Heinz, MD;  Location: [facility] ORS;  Service: General;  Laterality: N/A;  Minor Room  . PORTACATH PLACEMENT Right 07/07/2016   Procedure: INSERTION PORT-A-CATH;  Surgeon: Aviva Signs, MD;  Location: [facility] ORS;  Service: General;  Laterality: Right;  Marland Kitchen VIDEO ASSISTED THORACOSCOPY (VATS)/WEDGE RESECTION Right 11/15/2014   Procedure: VIDEO ASSISTED THORACOSCOPY (VATS)/LUNG RESECTION WITH RIGHT LINGULECTOMY;  Surgeon: [name], MD;  Location: [facility];  Service: Thoracic;  Laterality: Right;  Marland Kitchen VIDEO BRONCHOSCOPY N/A 11/15/2014   Procedure: VIDEO BRONCHOSCOPY;  Surgeon: [name], MD;  Location: [facility] OR;  Service: Thoracic;  Laterality: N/A;     SOCIAL HISTORY:  Social History   Socioeconomic History  . Marital status:  Married    Spouse name: Not on file  . Number of children: 2  . Years of education: Not on file  . Highest education level: Not on file  Occupational History  . Occupation: self-employed Regulatory affairs officer, currently not working    Fish farm manager: SELF EMPLOYED  Social Needs  . Financial resource strain: Not on file  . Food insecurity:    Worry: Not on file    Inability: Not on file  . Transportation needs:    Medical: Not on file    Non-medical: Not on file  Tobacco Use  . Smoking status: Never Smoker  . Smokeless tobacco: Never Used  Substance and Sexual Activity  . Alcohol use: No  . Drug use: No  . Sexual activity: Yes    Birth control/protection: None  Comment: married  Lifestyle  . Physical activity:    Days per week: Not on file    Minutes per session: Not on file  . Stress: Not on file  Relationships  . Social connections:    Talks on phone: Not on file    Gets together: Not on file    Attends religious service: Not on file    Active member of club or organization: Not on file    Attends meetings of clubs or organizations: Not on file    Relationship status: Not on file  . Intimate partner violence:    Fear of current or ex partner: Not on file    Emotionally abused: Not on file    Physically abused: Not on file    Forced sexual activity: Not on file  Other Topics Concern  . Not on file  Social History Narrative  . Not on file    FAMILY HISTORY:  Family History  Problem Relation Age of Onset  . Cancer Brother        throat  . Cancer Brother        prostate  . Colon cancer Neg Hx   . Liver disease Neg Hx   . Inflammatory bowel disease Neg Hx     CURRENT MEDICATIONS:  Outpatient Encounter Medications as of 02/16/2018  Medication Sig  . acetaminophen (TYLENOL) 500 MG tablet Take 1,000 mg by mouth every 6 (six) hours as needed.  Marland Kitchen amoxicillin (AMOXIL) 500 MG capsule Take 500 mg by mouth 3 (three) times daily.  . capecitabine (XELODA) 500 MG  tablet TAKE 3 TABLETS BY MOUTH TWICE DAILY FOR 14 DAYS IN A ROW FOLLOWED BY A 7 DAY REST PERIOD. TAKE AFTER MEAL  . clonazePAM (KLONOPIN) 0.5 MG tablet Take 1 tablet (0.5 mg total) by mouth 2 (two) times daily as needed.  . Diphenhyd-Hydrocort-Nystatin (FIRST-DUKES MOUTHWASH) SUSP Use as directed 5 mLs in the mouth or throat 4 (four) times daily.  Marland Kitchen gabapentin (NEURONTIN) 300 MG capsule Take 3 capsules (900 mg total) by mouth 2 (two) times daily.  Marland Kitchen glimepiride (AMARYL) 2 MG tablet Take 2 mg by mouth daily with breakfast.   . leucovorin in dextrose 5 % 250 mL Inject into the vein once.  Marland Kitchen lisinopril (PRINIVIL,ZESTRIL) 10 MG tablet   . magnesium oxide (MAG-OX) 400 (241.3 Mg) MG tablet Take 1 tablet (400 mg total) by mouth 2 (two) times daily.  . mirtazapine (REMERON) 7.5 MG tablet Take 7.5 mg by mouth at bedtime.  Marland Kitchen omeprazole (PRILOSEC) 20 MG capsule Take 20 mg by mouth 2 (two) times daily before a meal.   . ondansetron (ZOFRAN) 8 MG tablet Take 1 tablet (8 mg total) by mouth 2 (two) times daily as needed for refractory nausea / vomiting. Start on day 3 after chemotherapy.  . prochlorperazine (COMPAZINE) 10 MG tablet Take 1 tablet (10 mg total) by mouth every 6 (six) hours as needed (Nausea or vomiting).  . psyllium (REGULOID) 0.52 g capsule Take 0.52 g by mouth daily.  . rivaroxaban (XARELTO) 20 MG TABS tablet Take 1 tablet (20 mg total) by mouth daily with supper.  . tamsulosin (FLOMAX) 0.4 MG CAPS capsule Take 0.4 mg by mouth at bedtime.   . traZODone (DESYREL) 100 MG tablet Take 100 mg by mouth at bedtime.   Facility-Administered Encounter Medications as of 02/16/2018  Medication  . sodium chloride 0.9 % injection 10 mL  . sodium chloride flush (NS) 0.9 % injection 10 mL  ALLERGIES:  Allergies  Allergen Reactions  . Oxycodone     Blisters, hallucinations  . Tramadol     Blisters, hallucinations  . Trazodone And Nefazodone Other (See Comments)    hallucinations     PHYSICAL  EXAM:  ECOG Performance status: 1  VITAL SIGNS:BP: 144/77, P:84, R:18, T:97.7, SATS: 100% [id number]  Physical Exam  Constitutional: He is oriented to person, place, and time. He appears well-developed and well-nourished.  Cardiovascular: Normal rate, regular rhythm and normal heart sounds.  Pulmonary/Chest: Breath sounds normal.  Musculoskeletal: Normal range of motion.  Neurological: He is alert and oriented to person, place, and time.  Skin: Skin is warm and dry.  Psychiatric: He has a normal mood and affect. His behavior is normal. Judgment and thought content normal.     LABORATORY DATA:  I have reviewed the labs as listed.  CBC    Component Value Date/Time   WBC 5.5 02/16/2018 0931   RBC 3.97 (L) 02/16/2018 0931   HGB 13.0 02/16/2018 0931   HGB 11.6 (L) 02/04/2011 1059   HCT 41.1 02/16/2018 0931   HCT 34.7 (L) 02/04/2011 1059   PLT 200 02/16/2018 0931   PLT 282 02/04/2011 1059   MCV 103.5 (H) 02/16/2018 0931   MCV 76.0 (L) 02/04/2011 1059   MCH 32.7 02/16/2018 0931   MCHC 31.6 02/16/2018 0931   RDW 17.2 (H) 02/16/2018 0931   RDW 18.5 (H) 02/04/2011 1059   LYMPHSABS 1.8 02/16/2018 0931   LYMPHSABS 1.1 02/04/2011 1059   MONOABS 0.4 02/16/2018 0931   MONOABS 0.5 02/04/2011 1059   EOSABS 0.2 02/16/2018 0931   EOSABS 0.1 02/04/2011 1059   BASOSABS 0.0 02/16/2018 0931   BASOSABS 0.1 02/04/2011 1059   CMP Latest Ref Rng & Units 02/16/2018 01/26/2018 01/05/2018  Glucose 70 - 99 mg/dL 217(H) 199(H) 223(H)  BUN 8 - 23 mg/dL '12 11 11  '$ Creatinine 0.61 - 1.24 mg/dL 0.75 0.83 0.78  Sodium 135 - 145 mmol/L 142 141 141  Potassium 3.5 - 5.1 mmol/L 4.6 4.3 4.4  Chloride 98 - 111 mmol/L 110 110 109  CO2 22 - 32 mmol/L '26 24 27  '$ Calcium 8.9 - 10.3 mg/dL 8.6(L) 8.4(L) 8.5(L)  Total Protein 6.5 - 8.1 g/dL 6.4(L) 6.1(L) 6.1(L)  Total Bilirubin 0.3 - 1.2 mg/dL 1.4(H) 1.7(H) 1.6(H)  Alkaline Phos 38 - 126 U/L 85 75 78  AST 15 - 41 U/L '30 30 23  '$ ALT 0 - 44 U/L 27 32 23        DIAGNOSTIC IMAGING:  I have independently reviewed scans and discussed with the patient.    ASSESSMENT & PLAN:   Rectal cancer 1.  Stage IV rectal cancer with lung and pancreatic metastasis: -He was on maintenance 5-FU and bevacizumab.  He started Xeloda first cycle on 08/07/2017 and took 4 pills twice a day.  He is on a 2 weeks on 1 week of regimen.  He felt very tired after starting Xeloda.  He also had flulike symptoms.  His appetite has gone down. - We have cut back on Xeloda dose to 1500 mg twice a day during cycle 2.  - He is tolerating Xeloda very well.  No hand-foot skin reaction or mucositis.  He will start his next cycle tomorrow.  He is getting medication through [facility].  No bleeding issues reported from Avastin. - His last CEA level on 01/05/2018 was 5.2. -We discussed about the CT scan results dated 02/10/2018 which shows stable disease.  He may proceed with his next cycle of Avastin today.  I will see him back in 6 weeks for follow-up.  2.  Neuropathy: He takes gabapentin 900 mg twice daily for his numbness in the hands and feet which is well controlled.  3.  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  He will continue Xarelto 20 mg daily.  4. Rectal pain: He has hydrocodone 5 mg to be taken as needed.  He has not needed any pill in the past few weeks.   5.  Hypomagnesemia: Magnesium today is 1.8.  He will continue magnesium once daily.      Orders placed this encounter:  Orders Placed This Encounter  Procedures  . Magnesium  . CBC with Differential/Platelet  . Comprehensive metabolic panel  . Magnesium  . CBC with Differential/Platelet  . Comprehensive metabolic panel      Derek Jack, MD [place] [phone]

## 2018-02-16 NOTE — Patient Instructions (Signed)
[place] at [facility] Discharge Instructions  Follow up in 6 weeks with labs. Continue your normal treatment cycle with labs.   Thank you for choosing [place] at [facility] to provide your oncology and hematology care.  To afford each patient quality time with our provider, please arrive at least 15 minutes before your scheduled appointment time.   If you have a lab appointment with the [facility] please come in thru the  Main Entrance and check in at the main information desk  You need to re-schedule your appointment should you arrive 10 or more minutes late.  We strive to give you quality time with our providers, and arriving late affects you and other patients whose appointments are after yours.  Also, if you no show three or more times for appointments you may be dismissed from the clinic at the providers discretion.     Again, thank you for choosing [facility].  Our hope is that these requests will decrease the amount of time that you wait before being seen by our physicians.       _____________________________________________________________  Should you have questions after your visit to [facility], please contact our office at [phone] between the hours of 8:00 a.m. and 4:30 p.m.  Voicemails left after 4:00 p.m. will not be returned until the following business day.  For prescription refill requests, have your pharmacy contact our office and allow 72 hours.    Cancer Center Support Programs:   > Cancer Support Group  2nd Tuesday of the month 1pm-2pm, Journey Room

## 2018-02-16 NOTE — Progress Notes (Signed)
Pt ambulated to room 405 for treatment today. Labs drawn prior to visit. VSS. Complaints of a new rash on head, neck, and chin. Pt states, " I have been on antibiotics for chest congestion and cold." Amoxicillin 500mg  TID PO. Pt will finish round on Friday for a 7 day antibiotic treatment. Primary physician instructed patient to take Mucinex D over the counter.   Pt seen by Dr. [name] and VO received okay to continue with chemo today. Labs reviewed by MD.   Treatment given today per MD orders. Tolerated infusion without adverse affects. Vital signs stable. No complaints at this time. Discharged from clinic ambulatory. F/U with [facility] as scheduled.

## 2018-02-16 NOTE — Assessment & Plan Note (Signed)
1.  Stage IV rectal cancer with lung and pancreatic metastasis: -He was on maintenance 5-FU and bevacizumab.  He started Xeloda first cycle on 08/07/2017 and took 4 pills twice a day.  He is on a 2 weeks on 1 week of regimen.  He felt very tired after starting Xeloda.  He also had flulike symptoms.  His appetite has gone down. - We have cut back on Xeloda dose to 1500 mg twice a day during cycle 2.  - He is tolerating Xeloda very well.  No hand-foot skin reaction or mucositis.  He will start his next cycle tomorrow.  He is getting medication through [facility].  No bleeding issues reported from Avastin. - His last CEA level on 01/05/2018 was 5.2. -We discussed about the CT scan results dated 02/10/2018 which shows stable disease.  He may proceed with his next cycle of Avastin today.  I will see him back in 6 weeks for follow-up.  2.  Neuropathy: He takes gabapentin 900 mg twice daily for his numbness in the hands and feet which is well controlled.  3.  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  He will continue Xarelto 20 mg daily.  4. Rectal pain: He has hydrocodone 5 mg to be taken as needed.  He has not needed any pill in the past few weeks.   5.  Hypomagnesemia: Magnesium today is 1.8.  He will continue magnesium once daily.

## 2018-03-02 ENCOUNTER — Other Ambulatory Visit (HOSPITAL_COMMUNITY): Payer: Self-pay | Admitting: *Deleted

## 2018-03-02 DIAGNOSIS — C7800 Secondary malignant neoplasm of unspecified lung: Principal | ICD-10-CM

## 2018-03-02 DIAGNOSIS — C20 Malignant neoplasm of rectum: Secondary | ICD-10-CM

## 2018-03-05 MED FILL — XELODA 500 MG TABLET: 500 | 21 days supply | Qty: 84 | Fill #1

## 2018-03-08 DIAGNOSIS — Z125 Encounter for screening for malignant neoplasm of prostate: Secondary | ICD-10-CM | POA: Diagnosis not present

## 2018-03-08 DIAGNOSIS — Z6825 Body mass index (BMI) 25.0-25.9, adult: Secondary | ICD-10-CM | POA: Diagnosis not present

## 2018-03-08 DIAGNOSIS — Z7189 Other specified counseling: Secondary | ICD-10-CM | POA: Diagnosis not present

## 2018-03-08 DIAGNOSIS — Z79899 Other long term (current) drug therapy: Secondary | ICD-10-CM | POA: Diagnosis not present

## 2018-03-08 DIAGNOSIS — F419 Anxiety disorder, unspecified: Secondary | ICD-10-CM | POA: Diagnosis not present

## 2018-03-08 DIAGNOSIS — Z0000 Encounter for general adult medical examination without abnormal findings: Secondary | ICD-10-CM | POA: Diagnosis not present

## 2018-03-08 DIAGNOSIS — I10 Essential (primary) hypertension: Secondary | ICD-10-CM | POA: Diagnosis not present

## 2018-03-08 DIAGNOSIS — R5383 Other fatigue: Secondary | ICD-10-CM | POA: Diagnosis not present

## 2018-03-08 DIAGNOSIS — Z1339 Encounter for screening examination for other mental health and behavioral disorders: Secondary | ICD-10-CM | POA: Diagnosis not present

## 2018-03-08 DIAGNOSIS — Z299 Encounter for prophylactic measures, unspecified: Secondary | ICD-10-CM | POA: Diagnosis not present

## 2018-03-08 DIAGNOSIS — Z1331 Encounter for screening for depression: Secondary | ICD-10-CM | POA: Diagnosis not present

## 2018-03-09 ENCOUNTER — Inpatient Hospital Stay (HOSPITAL_COMMUNITY): Payer: Medicare Other

## 2018-03-09 ENCOUNTER — Inpatient Hospital Stay (HOSPITAL_COMMUNITY): Payer: Medicare Other | Attending: Hematology

## 2018-03-09 ENCOUNTER — Encounter (HOSPITAL_COMMUNITY): Payer: Self-pay

## 2018-03-09 ENCOUNTER — Other Ambulatory Visit: Payer: Self-pay

## 2018-03-09 VITALS — BP 145/72 | HR 92 | Temp 97.9°F | Resp 18 | Wt 210.0 lb

## 2018-03-09 DIAGNOSIS — C20 Malignant neoplasm of rectum: Secondary | ICD-10-CM | POA: Diagnosis not present

## 2018-03-09 DIAGNOSIS — Z86711 Personal history of pulmonary embolism: Secondary | ICD-10-CM | POA: Insufficient documentation

## 2018-03-09 DIAGNOSIS — G893 Neoplasm related pain (acute) (chronic): Secondary | ICD-10-CM | POA: Insufficient documentation

## 2018-03-09 DIAGNOSIS — Z923 Personal history of irradiation: Secondary | ICD-10-CM | POA: Insufficient documentation

## 2018-03-09 DIAGNOSIS — Z7901 Long term (current) use of anticoagulants: Secondary | ICD-10-CM | POA: Insufficient documentation

## 2018-03-09 DIAGNOSIS — Z9221 Personal history of antineoplastic chemotherapy: Secondary | ICD-10-CM | POA: Insufficient documentation

## 2018-03-09 DIAGNOSIS — Z86718 Personal history of other venous thrombosis and embolism: Secondary | ICD-10-CM | POA: Insufficient documentation

## 2018-03-09 DIAGNOSIS — C7800 Secondary malignant neoplasm of unspecified lung: Secondary | ICD-10-CM | POA: Insufficient documentation

## 2018-03-09 DIAGNOSIS — Z5112 Encounter for antineoplastic immunotherapy: Secondary | ICD-10-CM | POA: Diagnosis not present

## 2018-03-09 DIAGNOSIS — E1140 Type 2 diabetes mellitus with diabetic neuropathy, unspecified: Secondary | ICD-10-CM | POA: Insufficient documentation

## 2018-03-09 DIAGNOSIS — Z79899 Other long term (current) drug therapy: Secondary | ICD-10-CM | POA: Insufficient documentation

## 2018-03-09 DIAGNOSIS — C7889 Secondary malignant neoplasm of other digestive organs: Secondary | ICD-10-CM | POA: Diagnosis not present

## 2018-03-09 LAB — CBC WITH DIFFERENTIAL/PLATELET
Abs Immature Granulocytes: 0.01 10*3/uL (ref 0.00–0.07)
Basophils Absolute: 0 10*3/uL (ref 0.0–0.1)
Basophils Relative: 0 %
Eosinophils Absolute: 0.2 10*3/uL (ref 0.0–0.5)
Eosinophils Relative: 3 %
HCT: 42.2 % (ref 39.0–52.0)
Hemoglobin: 13.4 g/dL (ref 13.0–17.0)
Immature Granulocytes: 0 %
Lymphocytes Relative: 22 %
Lymphs Abs: 1.3 10*3/uL (ref 0.7–4.0)
MCH: 33.4 pg (ref 26.0–34.0)
MCHC: 31.8 g/dL (ref 30.0–36.0)
MCV: 105.2 fL — ABNORMAL HIGH (ref 80.0–100.0)
Monocytes Absolute: 0.5 10*3/uL (ref 0.1–1.0)
Monocytes Relative: 8 %
Neutro Abs: 4 10*3/uL (ref 1.7–7.7)
Neutrophils Relative %: 67 %
Platelets: 186 10*3/uL (ref 150–400)
RBC: 4.01 MIL/uL — ABNORMAL LOW (ref 4.22–5.81)
RDW: 17.4 % — ABNORMAL HIGH (ref 11.5–15.5)
WBC: 6 10*3/uL (ref 4.0–10.5)
nRBC: 0 % (ref 0.0–0.2)

## 2018-03-09 LAB — COMPREHENSIVE METABOLIC PANEL
ALT: 29 U/L (ref 0–44)
AST: 31 U/L (ref 15–41)
Albumin: 3.8 g/dL (ref 3.5–5.0)
Alkaline Phosphatase: 77 U/L (ref 38–126)
Anion gap: 8 (ref 5–15)
BUN: 11 mg/dL (ref 8–23)
CO2: 23 mmol/L (ref 22–32)
Calcium: 8.8 mg/dL — ABNORMAL LOW (ref 8.9–10.3)
Chloride: 109 mmol/L (ref 98–111)
Creatinine, Ser: 0.97 mg/dL (ref 0.61–1.24)
GFR calc Af Amer: >60 mL/min (ref >60)
GFR calc non Af Amer: >60 mL/min (ref >60)
Glucose, Bld: 146 mg/dL — ABNORMAL HIGH (ref 70–99)
Potassium: 3.6 mmol/L (ref 3.5–5.1)
Sodium: 140 mmol/L (ref 135–145)
Total Bilirubin: 1.5 mg/dL — ABNORMAL HIGH (ref 0.3–1.2)
Total Protein: 6.5 g/dL (ref 6.5–8.1)

## 2018-03-09 LAB — URINALYSIS, DIPSTICK ONLY
Bilirubin Urine: NEGATIVE
Glucose, UA: NEGATIVE mg/dL
Hgb urine dipstick: NEGATIVE
Ketones, ur: NEGATIVE mg/dL
Leukocytes, UA: NEGATIVE
Nitrite: NEGATIVE
Protein, ur: 30 mg/dL — AB
Specific Gravity, Urine: 1.025 (ref 1.005–1.030)
pH: 5 (ref 5.0–8.0)

## 2018-03-09 LAB — MAGNESIUM: Magnesium: 1.8 mg/dL (ref 1.7–2.4)

## 2018-03-09 MED ORDER — HEPARIN SOD (PORK) LOCK FLUSH 100 UNIT/ML IV SOLN
500.0000 [IU] | Freq: Once | INTRAVENOUS | Status: AC | PRN
  Administered 2018-03-09: 500 [IU]

## 2018-03-09 MED ORDER — SODIUM CHLORIDE 0.9 % IV SOLN
700.0000 mg | Freq: Once | INTRAVENOUS | Status: AC
  Administered 2018-03-09: 700 mg via INTRAVENOUS
  Filled 2018-03-09: qty 12

## 2018-03-09 MED ORDER — SODIUM CHLORIDE 0.9 % IV SOLN
Freq: Once | INTRAVENOUS | Status: AC
  Administered 2018-03-09: 14:00:00 via INTRAVENOUS

## 2018-03-09 MED ORDER — SODIUM CHLORIDE 0.9% FLUSH
10.0000 mL | INTRAVENOUS | Status: DC | PRN
  Administered 2018-03-09: 10 mL
  Filled 2018-03-09: qty 10

## 2018-03-09 NOTE — Progress Notes (Signed)
Labs reviewed with Dr. [name], protein in the urine noted. Proceed with treatment today per MD.   Treatment given per orders. Patient tolerated it well without problems. Vitals stable and discharged home from clinic ambulatory. Follow up as scheduled.

## 2018-03-09 NOTE — Patient Instructions (Signed)
[facility] Discharge Instructions for Patients Receiving Chemotherapy   Beginning January 23rd 2017 lab work for the Cancer Center will be done in the  Main lab at [facility] on 1st floor. If you have a lab appointment with the Cancer Center please come in thru the  Main Entrance and check in at the main information desk   Today you received the following chemotherapy agents   To help prevent nausea and vomiting after your treatment, we encourage you to take your nausea medication     If you develop nausea and vomiting, or diarrhea that is not controlled by your medication, call the clinic.  The clinic phone number is [phone]. Office hours are Monday-Friday 8:30am-5:00pm.  BELOW ARE SYMPTOMS THAT SHOULD BE REPORTED IMMEDIATELY:  *FEVER GREATER THAN 101.0 F  *CHILLS WITH OR WITHOUT FEVER  NAUSEA AND VOMITING THAT IS NOT CONTROLLED WITH YOUR NAUSEA MEDICATION  *UNUSUAL SHORTNESS OF BREATH  *UNUSUAL BRUISING OR BLEEDING  TENDERNESS IN MOUTH AND THROAT WITH OR WITHOUT PRESENCE OF ULCERS  *URINARY PROBLEMS  *BOWEL PROBLEMS  UNUSUAL RASH Items with * indicate a potential emergency and should be followed up as soon as possible. If you have an emergency after office hours please contact your primary care physician or go to the nearest emergency department.  Please call the clinic during office hours if you have any questions or concerns.   You may also contact the Patient Navigator at [phone] should you have any questions or need assistance in obtaining follow up care.      Resources For Cancer Patients and their Caregivers ? [employer]: Can assist with transportation, wigs, general needs, runs Look Good Feel Better.        1-888-227-6333 ? Cancer Care: Provides financial assistance, online support groups, medication/co-pay assistance.  1-800-813-HOPE (4673) ? [facility] Assists [place] cancer  patients and their families through emotional , educational and financial support.  336-427-4357 ? [place] DSS Where to apply for food stamps, Medicaid and utility assistance. 336-342-1394 ? [facility]: Transportation to medical appointments. 336-347-2287 ? Social Security Administration: May apply for disability if have a Stage IV cancer. 336-342-7796 1-800-772-1213 ? [place] [facility]: Assists with nutrition, care and transit needs. 336-349-2343         

## 2018-03-16 DIAGNOSIS — Z6825 Body mass index (BMI) 25.0-25.9, adult: Secondary | ICD-10-CM | POA: Diagnosis not present

## 2018-03-16 DIAGNOSIS — J329 Chronic sinusitis, unspecified: Secondary | ICD-10-CM | POA: Diagnosis not present

## 2018-03-16 DIAGNOSIS — I10 Essential (primary) hypertension: Secondary | ICD-10-CM | POA: Diagnosis not present

## 2018-03-16 DIAGNOSIS — Z789 Other specified health status: Secondary | ICD-10-CM | POA: Diagnosis not present

## 2018-03-16 DIAGNOSIS — Z299 Encounter for prophylactic measures, unspecified: Secondary | ICD-10-CM | POA: Diagnosis not present

## 2018-03-22 DIAGNOSIS — C189 Malignant neoplasm of colon, unspecified: Secondary | ICD-10-CM | POA: Diagnosis not present

## 2018-03-22 DIAGNOSIS — E1165 Type 2 diabetes mellitus with hyperglycemia: Secondary | ICD-10-CM | POA: Diagnosis not present

## 2018-03-22 DIAGNOSIS — I82409 Acute embolism and thrombosis of unspecified deep veins of unspecified lower extremity: Secondary | ICD-10-CM | POA: Diagnosis not present

## 2018-03-22 DIAGNOSIS — R05 Cough: Secondary | ICD-10-CM | POA: Diagnosis not present

## 2018-03-22 DIAGNOSIS — Z299 Encounter for prophylactic measures, unspecified: Secondary | ICD-10-CM | POA: Diagnosis not present

## 2018-03-22 DIAGNOSIS — J209 Acute bronchitis, unspecified: Secondary | ICD-10-CM | POA: Diagnosis not present

## 2018-03-25 MED FILL — XELODA 500 MG TABLET: 500 | 21 days supply | Qty: 84 | Fill #1

## 2018-03-30 ENCOUNTER — Other Ambulatory Visit: Payer: Self-pay

## 2018-03-30 ENCOUNTER — Inpatient Hospital Stay (HOSPITAL_BASED_OUTPATIENT_CLINIC_OR_DEPARTMENT_OTHER): Payer: Medicare Other | Admitting: Hematology

## 2018-03-30 ENCOUNTER — Inpatient Hospital Stay (HOSPITAL_COMMUNITY): Payer: Medicare Other

## 2018-03-30 ENCOUNTER — Encounter (HOSPITAL_COMMUNITY): Payer: Self-pay | Admitting: Hematology

## 2018-03-30 VITALS — BP 142/66 | HR 74 | Temp 97.6°F | Resp 16 | Wt 204.5 lb

## 2018-03-30 DIAGNOSIS — C7889 Secondary malignant neoplasm of other digestive organs: Secondary | ICD-10-CM

## 2018-03-30 DIAGNOSIS — Z5112 Encounter for antineoplastic immunotherapy: Secondary | ICD-10-CM | POA: Diagnosis not present

## 2018-03-30 DIAGNOSIS — Z9221 Personal history of antineoplastic chemotherapy: Secondary | ICD-10-CM | POA: Diagnosis not present

## 2018-03-30 DIAGNOSIS — C20 Malignant neoplasm of rectum: Secondary | ICD-10-CM

## 2018-03-30 DIAGNOSIS — Z79899 Other long term (current) drug therapy: Secondary | ICD-10-CM | POA: Diagnosis not present

## 2018-03-30 DIAGNOSIS — C187 Malignant neoplasm of sigmoid colon: Secondary | ICD-10-CM

## 2018-03-30 DIAGNOSIS — Z86711 Personal history of pulmonary embolism: Secondary | ICD-10-CM | POA: Diagnosis not present

## 2018-03-30 DIAGNOSIS — G893 Neoplasm related pain (acute) (chronic): Secondary | ICD-10-CM

## 2018-03-30 DIAGNOSIS — Z86718 Personal history of other venous thrombosis and embolism: Secondary | ICD-10-CM | POA: Diagnosis not present

## 2018-03-30 DIAGNOSIS — Z923 Personal history of irradiation: Secondary | ICD-10-CM | POA: Diagnosis not present

## 2018-03-30 DIAGNOSIS — C7800 Secondary malignant neoplasm of unspecified lung: Secondary | ICD-10-CM | POA: Diagnosis not present

## 2018-03-30 DIAGNOSIS — Z7901 Long term (current) use of anticoagulants: Secondary | ICD-10-CM

## 2018-03-30 DIAGNOSIS — E1140 Type 2 diabetes mellitus with diabetic neuropathy, unspecified: Secondary | ICD-10-CM

## 2018-03-30 LAB — URINALYSIS, DIPSTICK ONLY
Bilirubin Urine: NEGATIVE
Glucose, UA: NEGATIVE mg/dL
Hgb urine dipstick: NEGATIVE
Ketones, ur: NEGATIVE mg/dL
Leukocytes, UA: NEGATIVE
Nitrite: NEGATIVE
Protein, ur: NEGATIVE mg/dL
Specific Gravity, Urine: 1.02 (ref 1.005–1.030)
pH: 5 (ref 5.0–8.0)

## 2018-03-30 LAB — CBC WITH DIFFERENTIAL/PLATELET
Abs Immature Granulocytes: 0.03 10*3/uL (ref 0.00–0.07)
Basophils Absolute: 0 10*3/uL (ref 0.0–0.1)
Basophils Relative: 1 %
Eosinophils Absolute: 0.2 10*3/uL (ref 0.0–0.5)
Eosinophils Relative: 2 %
HCT: 42 % (ref 39.0–52.0)
Hemoglobin: 13.7 g/dL (ref 13.0–17.0)
Immature Granulocytes: 0 %
Lymphocytes Relative: 27 %
Lymphs Abs: 2.3 10*3/uL (ref 0.7–4.0)
MCH: 33.7 pg (ref 26.0–34.0)
MCHC: 32.6 g/dL (ref 30.0–36.0)
MCV: 103.2 fL — ABNORMAL HIGH (ref 80.0–100.0)
Monocytes Absolute: 0.5 10*3/uL (ref 0.1–1.0)
Monocytes Relative: 6 %
Neutro Abs: 5.5 10*3/uL (ref 1.7–7.7)
Neutrophils Relative %: 64 %
Platelets: 187 10*3/uL (ref 150–400)
RBC: 4.07 MIL/uL — ABNORMAL LOW (ref 4.22–5.81)
RDW: 17.3 % — ABNORMAL HIGH (ref 11.5–15.5)
WBC: 8.6 10*3/uL (ref 4.0–10.5)
nRBC: 0 % (ref 0.0–0.2)

## 2018-03-30 LAB — COMPREHENSIVE METABOLIC PANEL
ALT: 30 U/L (ref 0–44)
AST: 26 U/L (ref 15–41)
Albumin: 3.7 g/dL (ref 3.5–5.0)
Alkaline Phosphatase: 79 U/L (ref 38–126)
Anion gap: 6 (ref 5–15)
BUN: 16 mg/dL (ref 8–23)
CO2: 25 mmol/L (ref 22–32)
Calcium: 8.9 mg/dL (ref 8.9–10.3)
Chloride: 107 mmol/L (ref 98–111)
Creatinine, Ser: 0.9 mg/dL (ref 0.61–1.24)
GFR calc Af Amer: >60 mL/min (ref >60)
GFR calc non Af Amer: >60 mL/min (ref >60)
Glucose, Bld: 172 mg/dL — ABNORMAL HIGH (ref 70–99)
Potassium: 4.5 mmol/L (ref 3.5–5.1)
Sodium: 138 mmol/L (ref 135–145)
Total Bilirubin: 1.5 mg/dL — ABNORMAL HIGH (ref 0.3–1.2)
Total Protein: 6.3 g/dL — ABNORMAL LOW (ref 6.5–8.1)

## 2018-03-30 LAB — MAGNESIUM: Magnesium: 1.7 mg/dL (ref 1.7–2.4)

## 2018-03-30 MED ORDER — HEPARIN SOD (PORK) LOCK FLUSH 100 UNIT/ML IV SOLN
500.0000 [IU] | Freq: Once | INTRAVENOUS | Status: AC | PRN
  Administered 2018-03-30: 500 [IU]

## 2018-03-30 MED ORDER — SODIUM CHLORIDE 0.9 % IV SOLN
700.0000 mg | Freq: Once | INTRAVENOUS | Status: AC
  Administered 2018-03-30: 700 mg via INTRAVENOUS
  Filled 2018-03-30: qty 12

## 2018-03-30 MED ORDER — SODIUM CHLORIDE 0.9 % IV SOLN
Freq: Once | INTRAVENOUS | Status: AC
  Administered 2018-03-30: 12:00:00 via INTRAVENOUS

## 2018-03-30 NOTE — Progress Notes (Signed)
Tolerated infusion w/o adverse reaction.  Alert, in no distress. Discharged ambulatory in c/o spouse.

## 2018-03-30 NOTE — Progress Notes (Signed)
[place], [place] [address]   CLINIC:  Medical Oncology/Hematology  PCP:  [name], MD [place]  [phone]   REASON FOR VISIT: Follow-up for rectal cancer  CURRENT THERAPY: Xeloda tabs 2 weeks on 1 week off and Bevacizumab  BRIEF ONCOLOGIC HISTORY:    Rectal cancer ([place])   07/24/2010 Initial Diagnosis    Invasive adenocarcinoma of rectum    09/09/2010 Concurrent Chemotherapy    S/P radiation and concurrent 5-FU continuous infusion from 09/09/10- 10/10/10.    11/14/2010 Surgery    S/P proctectomy with colorectal anastomosis and diverting loop ileostomy on 11/14/10 at [facility] by Dr. Harlon Ditty. Pathology reveals a pT3b N1 with 3/20 lymph nodes.    02/05/2011 - 07/14/2011 Chemotherapy    FOLFOX    08/18/2011 Surgery    Approximate date of surgery- [place] by Dr. Harlon Ditty     Remission       11/24/2013 Survivorship    Colonoscopy- somewhat fibrotic/friable anastomotic mucosal-status post biopsy (narrowing not felt to be clinically significant).  Negative pathology for malignancy    09/12/2014 Imaging    DVT in the left femoral venous system, left common iliac vein, IVC, and within the IVC filter Right lower extremity venography confirms chronic occlusion of the femoral venous system with collateralization. The right iliac venous system is patent and do    09/25/2014 PET scan    The right middle lobe pulmonary nodule is hypermetabolic, favored to represent a primary bronchogenic carcinoma.Equivocal mediastinal nodes, similar to surrounding blood pool. Bilateral adrenal hypermetabolism, felt to be physiologic    10/24/2014 Pathology Results    Lung, needle/core biopsy(ies), RML - ADENOCARCINOMA, SEE COMMENT metastatic adenocarcinoma of a colorectal primary    10/24/2014 Relapse/Recurrence       11/16/2014 Definitive Surgery    Bronchoscopy, right video-assisted thoracoscopy, wedge resection of right middle  lobe by Dr. Servando Snare    11/16/2014 Pathology Results    Lung, wedge biopsy/resection, right lingula and small portion of middle lobe - METASTATIC ADENOCARCINOMA, CONSISTENT WITH COLORECTAL PRIMARY, SPANNING 2.0 CM. - THE SURGICAL RESECTION MARGINS ARE NEGATIVE FOR ADENOCARCINOMA.    11/16/2014 Remission    THE SURGICAL RESECTION MARGINS ARE NEGATIVE FOR ADENOCARCINOMA.    03/07/2015 Imaging    CT CAP- Interval resection of right middle lobe metastasis. No acute process or evidence of metastatic disease in the chest, abdomen or pelvis. Improved right upper lobe reticular nodular opacities are favored to represent resolving infection.    09/14/2015 Imaging    CT CAP- No findings of recurrent malignancy. No recurrence along the wedge resection site of the right middle lobe. Right anterior abdominal wall focal hernia containing a knuckle of small bowel without complicating feature.    06/13/2016 Imaging    CT CAP- 1. New right-sided pleural metastasis/mass. Other smaller right-sided pulmonary nodules which are all pleural-based and most likely represent pleural metastasis. 2. No convincing evidence of abdominopelvic nodal metastasis. 3. Constellation of findings, including pancreatic atrophy, duct dilatation, and pancreatic head soft tissue fullness which are highly suspicious for pancreatic adenocarcinoma. Metastatic disease felt much less likely. Consider endoscopic ultrasound sampling or ERCP. Cannot exclude superimposed acute pancreatitis. 4. New enlargement of the appendiceal tip with subtle surrounding edema. Cannot exclude early or mild appendicitis. 5. Coronary artery atherosclerosis. Aortic atherosclerosis. 6. 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    06/13/2016 Progression    CT scan demonstrates progression of  disease    06/19/2016 Procedure    EUS with FNA by Dr. Ardis Hughs    06/20/2016 Pathology Results    FINE NEEDLE ASPIRATION, ENDOSCOPIC, PANCREAS  UNCINATE AREA(SPECIMEN 1 OF 1 COLLECTED 06/19/16): MALIGNANT CELLS CONSISTENT WITH METASTATIC ADENOCARCINOMA.    06/25/2016 PET scan    1. Two pleural-based nodules in the right hemithorax are hypermetabolic. Metastatic disease is a distinct consideration. The scattered pulmonary parenchymal nodule seen on previous diagnostic CT imaging are below the threshold for reliable resolution on PET imaging. 2. Hypermetabolic lesion pancreatic head, consistent with neoplasm. As noted on prior CT, adenocarcinoma is any consideration. No evidence for hypermetabolic abdominal lymphadenopathy. Mottled uptake noted in the liver, but no discrete hepatic metastases are evident on PET imaging. 3. Appendix remains distended up to 0.9-10 mm diameter with a stone towards the tip in subtle periappendiceal edema/inflammation. The appendix is hypermetabolic along its length. Imaging features are relatively stable in the 12 day interval since prior CT scan. While appendicitis is a consideration, the relative stability over 12 days would be unusual for that etiology. Continued close follow-up recommended.    06/26/2016 Pathology Results    Not enough tissue for foundationONE or K-ras testing.    07/07/2016 Procedure    Port placed by Dr. Arnoldo Morale    07/08/2016 -  Chemotherapy    The patient had pegfilgrastim (NEULASTA) injection 6 mg, 6 mg, Subcutaneous,  Once, 0 of 4 cycles  ondansetron (ZOFRAN) IVPB 8 mg, 8 mg, Intravenous,  Once, 0 of 4 cycles  leucovorin 804 mg in dextrose 5 % 250 mL infusion, 400 mg/m2, Intravenous,  Once, 0 of 4 cycles  oxaliplatin (ELOXATIN) 170 mg in dextrose 5 % 500 mL chemo infusion, 85 mg/m2, Intravenous,  Once, 0 of 4 cycles  fluorouracil (ADRUCIL) 4,800 mg in sodium chloride 0.9 % 150 mL chemo infusion, 2,400 mg/m2 = 4,800 mg, Intravenous, 1 Day/Dose, 0 of 4 cycles  fluorouracil (ADRUCIL) chemo injection 800 mg, 400 mg/m2, Intravenous,  Once, 0 of 4 cycles  pegfilgrastim  (NEULASTA) injection 6 mg, 6 mg, Subcutaneous,  Once, 2 of 2 cycles  ondansetron (ZOFRAN) IVPB 8 mg, 8 mg, Intravenous,  Once, 12 of 12 cycles  leucovorin 660 mg in dextrose 5 % 250 mL infusion, 660 mg (original dose ), Intravenous,  Once, 12 of 12 cycles Dose modification: 660 mg (Cycle 1)  oxaliplatin (ELOXATIN) 140 mg in dextrose 5 % 500 mL chemo infusion, 140 mg (original dose ), Intravenous,  Once, 12 of 12 cycles Dose modification: 140 mg (Cycle 1), 119 mg (85 % of original dose 140 mg, Cycle 8, Reason: Provider Judgment)  fluorouracil (ADRUCIL) 3,850 mg in sodium chloride 0.9 % 150 mL chemo infusion, 3,850 mg (original dose ), Intravenous, 1 Day/Dose, 12 of 12 cycles Dose modification: 3,850 mg (Cycle 1)  fluorouracil (ADRUCIL) chemo injection 700 mg, 700 mg (original dose ), Intravenous,  Once, 12 of 12 cycles Dose modification: 700 mg (Cycle 1)  palonosetron (ALOXI) injection 0.25 mg, 0.25 mg, Intravenous,  Once, 7 of 12 cycles Administration: 0.25 mg (09/30/2016)  bevacizumab (AVASTIN) 475 mg in sodium chloride 0.9 % 100 mL chemo infusion, 5 mg/kg = 475 mg, Intravenous,  Once, 6 of 11 cycles Administration: 500 mg (09/30/2016)  leucovorin 880 mg in dextrose 5 % 250 mL infusion, 400 mg/m2 = 880 mg, Intravenous,  Once, 7 of 12 cycles Administration: 880 mg (09/30/2016)  oxaliplatin (ELOXATIN) 185 mg in dextrose 5 % 500 mL chemo infusion, 85 mg/m2 = 185  mg, Intravenous,  Once, 7 of 12 cycles Administration: 185 mg (09/30/2016)  fluorouracil (ADRUCIL) chemo injection 900 mg, 400 mg/m2 = 900 mg, Intravenous,  Once, 7 of 12 cycles Administration: 900 mg (09/30/2016)  fluorouracil (ADRUCIL) 5,300 mg in sodium chloride 0.9 % 144 mL chemo infusion, 2,400 mg/m2 = 5,300 mg, Intravenous, 1 Day/Dose, 7 of 12 cycles Administration: 5,300 mg (09/30/2016)  for chemotherapy treatment.      08/27/2016 Imaging    CT CAP- 1. Pulmonary metastatic disease is stable. 2. Pancreatic head mass,  grossly stable, with progressive atrophy of the body and tail of the pancreas. Stable peripancreatic lymph node. 3. Mild circumferential rectal wall thickening, stable. 4. Aortic atherosclerosis (ICD10-170.0). Coronary artery calcification. 5. Hyperattenuating lesion off the lower pole left kidney, stable, too small to characterize. Continued attention on followup exams is warranted. 6. Persistent wall thickening and mild dilatation of the proximal appendix, of uncertain etiology. Continued attention on followup exams is warranted.    11/06/2016 Imaging    CT C/A/P: IMPRESSION: 1. Stable pulmonary metastatic disease. 2. Similar appearing pancreatic head mass. Progressed atrophy of the pancreatic body and tail. Stable peripancreatic lymph node. 3. Stable hypoattenuating lesion partially exophytic off the inferior pole of the left kidney. 4. Aortic atherosclerosis. 5. Persistent mild wall thickening and mild dilatation of the appendix.     01/30/2017 Imaging    Ct C/A/P: IMPRESSION: 1. Stable pulmonary metastatic disease. 2. Stable to slight increase in size of pancreatic head mass. 3.  Aortic Atherosclerosis (ICD10-I70.0). 4. Similar appearance of mild wall thickening of the appendix which contains an appendicolith.    05/01/2017 Imaging    CT C/A/P: Stable disease in the lungs, no progression of disease in the abdomen.      CANCER STAGING: Cancer Staging Rectal cancer ([place]) Staging form: Colon and Rectum, AJCC 7th Edition - Clinical: Stage IIIB (T3, N1, M0) - Signed by [name], MD on 02/04/2011 - Pathologic stage from 11/17/2014: Stage IVA (M1a) - Signed by Baird Cancer, PA-C on 09/14/2015    INTERVAL HISTORY:  Charles [name] 67 y.o. male returns for routine follow-up for rectal cancer. He is tolerating treatment well he does have a few side effects that are manable. He has numbness and tingling in his hands and feet this stable. He had one nose bleed that  stopped within minutes. He denies any nausea, vomiting, or diarrhea. Denies any skin rashes or mouth sores. Denies any bleeding or easy bruising. He reports his appetite and energy level at 100%. He also drinks ensure daily to help maintain his nutrition.      REVIEW OF SYSTEMS:  Review of Systems  Neurological: Positive for numbness.  Psychiatric/Behavioral: Positive for sleep disturbance.  All other systems reviewed and are negative.    PAST MEDICAL/SURGICAL HISTORY:  Past Medical History:  Diagnosis Date  . Chronic anticoagulation   . Colon cancer ([place]) 07/24/2010   rectal ca, inv adenocarcinoma  . Depression 04/01/2011  . Diabetes mellitus without complication ([place])   . DVT (deep venous thrombosis) ([place]) 05/09/2011  . GERD (gastroesophageal reflux disease)   . High output ileostomy ([place]) 05/21/2011  . History of kidney stones   . HTN (hypertension)   . Hx of radiation therapy 09/02/10 to 10/14/10   pelvis  . Lung metastasis ([place])   . Neuropathy   . Peripheral vascular disease ([place])    dvt's,pe  . Pneumonia    hx x3  . Pulmonary embolism ([place])   . Rectal cancer (  [place]) 01/15/2011   S/P radiation and concurrent 5-FU continuous infusion from 09/09/10- 10/10/10.  S/P proctectomy with colorectal anastomosis and diverting loop ileostomy on 11/14/10 at [facility] by Dr. Harlon Ditty. Pathology reveals a pT3b N1 with 3/20 lymph nodes.     Past Surgical History:  Procedure Laterality Date  . COLON SURGERY  11/14/2010   proctectomy with colorectal anastomosis and diverting loop ileostomy (temporary planned)  . COLONOSCOPY  07/2010   proximal rectal apple core mass 10-14cm from anal verge (adenocarcinoma), 2-3cm distal rectal carpet polyp s/p piecemeal snare polypectomy (adenoma)  . COLONOSCOPY  04/21/2012   RMR: Friable,fibrotic appearing colorectal anastomosis producing some luminal narrowing-not felt to be critical. path: focal erosion with slight inflammation and hyperemia. SURVEILLANCE DUE  DEC 2015  . COLONOSCOPY N/A 11/24/2013   Dr. Rourk:somewhat fibrotic/friable anastomotic mucosal-status post biopsy (narrowing not felt to be clinically significant) Single colonic diverticulum. benign polypoid rectal mucosa  . colostomy reversal  april 2013  . ESOPHAGOGASTRODUODENOSCOPY  07/2010   RMR: schatki ring s/p dilation, small hh, SB bx benign  . ESOPHAGOGASTRODUODENOSCOPY N/A 09/04/2015   Procedure: ESOPHAGOGASTRODUODENOSCOPY (EGD);  Surgeon: Daneil Dolin, MD;  Location: [facility];  Service: Endoscopy;  Laterality: N/A;  730  . EUS  08/2010   Dr. Owens Loffler. uT3N0 circumferential, nearly obstruction rectosigmoid adenocarcinoma, distal edge 12cm from anal verge  . EUS N/A 06/19/2016   Procedure: UPPER ENDOSCOPIC ULTRASOUND (EUS) RADIAL;  Surgeon: Milus Banister, MD;  Location: [facility];  Service: Endoscopy;  Laterality: N/A;  . HERNIA REPAIR     abd hernia repair  . IVC filter    . ivc filter    . port a cath placement    . PORT-A-CATH REMOVAL  09/24/2011   Procedure: REMOVAL PORT-A-CATH;  Surgeon: Donato Heinz, MD;  Location: [facility] ORS;  Service: General;  Laterality: N/A;  Minor Room  . PORTACATH PLACEMENT Right 07/07/2016   Procedure: INSERTION PORT-A-CATH;  Surgeon: Aviva Signs, MD;  Location: [facility] ORS;  Service: General;  Laterality: Right;  Marland Kitchen VIDEO ASSISTED THORACOSCOPY (VATS)/WEDGE RESECTION Right 11/15/2014   Procedure: VIDEO ASSISTED THORACOSCOPY (VATS)/LUNG RESECTION WITH RIGHT LINGULECTOMY;  Surgeon: [name], MD;  Location: [facility];  Service: Thoracic;  Laterality: Right;  Marland Kitchen VIDEO BRONCHOSCOPY N/A 11/15/2014   Procedure: VIDEO BRONCHOSCOPY;  Surgeon: [name], MD;  Location: [facility];  Service: Thoracic;  Laterality: N/A;     SOCIAL HISTORY:  Social History   Socioeconomic History  . Marital status: Married    Spouse name: Not on file  . Number of children: 2  . Years of education: Not on file  . Highest education level: Not on file    Occupational History  . Occupation: self-employed Regulatory affairs officer, currently not working    Fish farm manager: SELF EMPLOYED  Social Needs  . Financial resource strain: Not on file  . Food insecurity:    Worry: Not on file    Inability: Not on file  . Transportation needs:    Medical: Not on file    Non-medical: Not on file  Tobacco Use  . Smoking status: Never Smoker  . Smokeless tobacco: Never Used  Substance and Sexual Activity  . Alcohol use: No  . Drug use: No  . Sexual activity: Yes    Birth control/protection: None    Comment: married  Lifestyle  . Physical activity:    Days per week: Not on file    Minutes per session: Not on file  .  Stress: Not on file  Relationships  . Social connections:    Talks on phone: Not on file    Gets together: Not on file    Attends religious service: Not on file    Active member of club or organization: Not on file    Attends meetings of clubs or organizations: Not on file    Relationship status: Not on file  . Intimate partner violence:    Fear of current or ex partner: Not on file    Emotionally abused: Not on file    Physically abused: Not on file    Forced sexual activity: Not on file  Other Topics Concern  . Not on file  Social History Narrative  . Not on file    FAMILY HISTORY:  Family History  Problem Relation Age of Onset  . Cancer Brother        throat  . Cancer Brother        prostate  . Colon cancer Neg Hx   . Liver disease Neg Hx   . Inflammatory bowel disease Neg Hx     CURRENT MEDICATIONS:  Outpatient Encounter Medications as of 03/30/2018  Medication Sig  . acetaminophen (TYLENOL) 500 MG tablet Take 1,000 mg by mouth every 6 (six) hours as needed.  Marland Kitchen amoxicillin (AMOXIL) 500 MG capsule Take 500 mg by mouth 3 (three) times daily.  . capecitabine (XELODA) 500 MG tablet TAKE 3 TABLETS BY MOUTH TWICE DAILY FOR 14 DAYS IN A ROW FOLLOWED BY A 7 DAY REST PERIOD. TAKE AFTER MEAL  . clonazePAM (KLONOPIN)  0.5 MG tablet Take 1 tablet (0.5 mg total) by mouth 2 (two) times daily as needed.  . Diphenhyd-Hydrocort-Nystatin (FIRST-DUKES MOUTHWASH) SUSP Use as directed 5 mLs in the mouth or throat 4 (four) times daily.  Marland Kitchen gabapentin (NEURONTIN) 300 MG capsule Take 3 capsules (900 mg total) by mouth 2 (two) times daily.  Marland Kitchen glimepiride (AMARYL) 2 MG tablet Take 2 mg by mouth daily with breakfast.   . leucovorin in dextrose 5 % 250 mL Inject into the vein once.  Marland Kitchen lisinopril (PRINIVIL,ZESTRIL) 10 MG tablet   . magnesium oxide (MAG-OX) 400 (241.3 Mg) MG tablet Take 1 tablet (400 mg total) by mouth 2 (two) times daily.  . mirtazapine (REMERON) 7.5 MG tablet Take 7.5 mg by mouth at bedtime.  Marland Kitchen omeprazole (PRILOSEC) 20 MG capsule Take 20 mg by mouth 2 (two) times daily before a meal.   . ondansetron (ZOFRAN) 8 MG tablet Take 1 tablet (8 mg total) by mouth 2 (two) times daily as needed for refractory nausea / vomiting. Start on day 3 after chemotherapy.  . prochlorperazine (COMPAZINE) 10 MG tablet Take 1 tablet (10 mg total) by mouth every 6 (six) hours as needed (Nausea or vomiting).  . psyllium (REGULOID) 0.52 g capsule Take 0.52 g by mouth daily.  . rivaroxaban (XARELTO) 20 MG TABS tablet Take 1 tablet (20 mg total) by mouth daily with supper.  . tamsulosin (FLOMAX) 0.4 MG CAPS capsule Take 0.4 mg by mouth at bedtime.   . traZODone (DESYREL) 100 MG tablet Take 100 mg by mouth at bedtime.   Facility-Administered Encounter Medications as of 03/30/2018  Medication  . sodium chloride 0.9 % injection 10 mL  . sodium chloride flush (NS) 0.9 % injection 10 mL    ALLERGIES:  Allergies  Allergen Reactions  . Oxycodone     Blisters, hallucinations  . Tramadol     Blisters, hallucinations  . Trazodone  And Nefazodone Other (See Comments)    hallucinations     PHYSICAL EXAM:  ECOG Performance status: 1  Vitals:   03/30/18 1056  BP: (!) 142/66  Pulse: 74  Resp: 16  Temp: 97.6 F (36.4 C)  SpO2: 100%    Filed Weights   03/30/18 1056  Weight: 204 lb 8 oz (92.8 kg)    Physical Exam  Constitutional: He is oriented to person, place, and time. He appears well-developed and well-nourished.  Cardiovascular: Normal rate, regular rhythm and normal heart sounds.  Pulmonary/Chest: Effort normal and breath sounds normal.  Musculoskeletal: Normal range of motion.  Neurological: He is alert and oriented to person, place, and time.  Skin: Skin is warm and dry.  Psychiatric: He has a normal mood and affect. His behavior is normal. Judgment and thought content normal.     LABORATORY DATA:  I have reviewed the labs as listed.  CBC    Component Value Date/Time   WBC 8.6 03/30/2018 1007   RBC 4.07 (L) 03/30/2018 1007   HGB 13.7 03/30/2018 1007   HGB 11.6 (L) 02/04/2011 1059   HCT 42.0 03/30/2018 1007   HCT 34.7 (L) 02/04/2011 1059   PLT 187 03/30/2018 1007   PLT 282 02/04/2011 1059   MCV 103.2 (H) 03/30/2018 1007   MCV 76.0 (L) 02/04/2011 1059   MCH 33.7 03/30/2018 1007   MCHC 32.6 03/30/2018 1007   RDW 17.3 (H) 03/30/2018 1007   RDW 18.5 (H) 02/04/2011 1059   LYMPHSABS 2.3 03/30/2018 1007   LYMPHSABS 1.1 02/04/2011 1059   MONOABS 0.5 03/30/2018 1007   MONOABS 0.5 02/04/2011 1059   EOSABS 0.2 03/30/2018 1007   EOSABS 0.1 02/04/2011 1059   BASOSABS 0.0 03/30/2018 1007   BASOSABS 0.1 02/04/2011 1059   CMP Latest Ref Rng & Units 03/30/2018 03/09/2018 02/16/2018  Glucose 70 - 99 mg/dL 172(H) 146(H) 217(H)  BUN 8 - 23 mg/dL _0 Creatinine 0.61 - 1.24 mg/dL 0.90 0.97 0.75  Sodium 135 - 145 mmol/L 138 140 142  Potassium 3.5 - 5.1 mmol/L 4.5 3.6 4.6  Chloride 98 - 111 mmol/L 107 109 110  CO2 22 - 32 mmol/L _1 Calcium 8.9 - 10.3 mg/dL 8.9 8.8(L) 8.6(L)  Total Protein 6.5 - 8.1 g/dL 6.3(L) 6.5 6.4(L)  Total Bilirubin 0.3 - 1.2 mg/dL 1.5(H) 1.5(H) 1.4(H)  Alkaline Phos 38 - 126 U/L 79 77 85  AST 15 - 41 U/L _2 ALT 0 - 44 U/L _3 DIAGNOSTIC IMAGING:    I have independently reviewed the scans and discussed with the patient.  I have reviewed Francene Finders, NP's note and agree with the documentation.  I personally performed a face-to-face visit, made revisions and my assessment and plan is as follows.     ASSESSMENT & PLAN:   Rectal cancer 1.  Stage IV rectal cancer with lung and pancreatic metastasis: -He was on maintenance 5-FU and bevacizumab.  He started Xeloda first cycle on 08/07/2017 and took 4 pills twice a day.  He is on a 2 weeks on 1 week of regimen.  He felt very tired after starting Xeloda.  He also had flulike symptoms.  His appetite has gone down. - We have cut back on Xeloda dose to 1500 mg twice a day during cycle 2.  - CT scan dated 02/10/2018 reviewed by me shows stable disease. - He will start his Xeloda 1500 mg  twice daily, 2 weeks on 1 week off tomorrow. - He is tolerating Avastin without any major problems.  He had an episode of bronchitis last week and took antibiotics. - He will continue Avastin every 3 weeks.  We will continue to monitor his blood counts on day 1 of every cycle.  CEA is within normal limits. -I will see him back in 9 weeks with repeat CT scan.  We will also repeat CEA at that time.  2.  Neuropathy: He takes gabapentin 900 mg twice daily for numbness in the hands and feet.  This is well controlled.   3.  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  He will continue Xarelto 20 mg daily.  4. Rectal pain: He has hydrocodone 5 mg to be taken as needed.  He has not needed any pill in the past few weeks.   5.  Hypomagnesemia: Magnesium is 1.7 today.  He will continue magnesium daily.      Orders placed this encounter:  Orders Placed This Encounter  Procedures  . Magnesium  . CBC with Differential/Platelet  . Comprehensive metabolic panel  . CEA      Derek Jack, MD [place] [phone]

## 2018-03-30 NOTE — Assessment & Plan Note (Signed)
1.  Stage IV rectal cancer with lung and pancreatic metastasis: -He was on maintenance 5-FU and bevacizumab.  He started Xeloda first cycle on 08/07/2017 and took 4 pills twice a day.  He is on a 2 weeks on 1 week of regimen.  He felt very tired after starting Xeloda.  He also had flulike symptoms.  His appetite has gone down. - We have cut back on Xeloda dose to 1500 mg twice a day during cycle 2.  - CT scan dated 02/10/2018 reviewed by me shows stable disease. - He will start his Xeloda 1500 mg twice daily, 2 weeks on 1 week off tomorrow. - He is tolerating Avastin without any major problems.  He had an episode of bronchitis last week and took antibiotics. - He will continue Avastin every 3 weeks.  We will continue to monitor his blood counts on day 1 of every cycle.  CEA is within normal limits. -I will see him back in 9 weeks with repeat CT scan.  We will also repeat CEA at that time.  2.  Neuropathy: He takes gabapentin 900 mg twice daily for numbness in the hands and feet.  This is well controlled.   3.  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  He will continue Xarelto 20 mg daily.  4. Rectal pain: He has hydrocodone 5 mg to be taken as needed.  He has not needed any pill in the past few weeks.   5.  Hypomagnesemia: Magnesium is 1.7 today.  He will continue magnesium daily.

## 2018-03-30 NOTE — Patient Instructions (Addendum)
[place] at [facility] Discharge Instructions   Follow up in 9 weeks with scans and labs and treatment.   Thank you for choosing [place] at [facility] to provide your oncology and hematology care.  To afford each patient quality time with our provider, please arrive at least 15 minutes before your scheduled appointment time.   If you have a lab appointment with the [facility] please come in thru the  Main Entrance and check in at the main information desk  You need to re-schedule your appointment should you arrive 10 or more minutes late.  We strive to give you quality time with our providers, and arriving late affects you and other patients whose appointments are after yours.  Also, if you no show three or more times for appointments you may be dismissed from the clinic at the providers discretion.     Again, thank you for choosing [facility].  Our hope is that these requests will decrease the amount of time that you wait before being seen by our physicians.       _____________________________________________________________  Should you have questions after your visit to [facility], please contact our office at [phone] between the hours of 8:00 a.m. and 4:30 p.m.  Voicemails left after 4:00 p.m. will not be returned until the following business day.  For prescription refill requests, have your pharmacy contact our office and allow 72 hours.    Cancer Center Support Programs:   > Cancer Support Group  2nd Tuesday of the month 1pm-2pm, Journey Room

## 2018-04-09 ENCOUNTER — Other Ambulatory Visit (HOSPITAL_COMMUNITY): Payer: Self-pay | Admitting: Hematology

## 2018-04-09 DIAGNOSIS — C7800 Secondary malignant neoplasm of unspecified lung: Secondary | ICD-10-CM

## 2018-04-09 DIAGNOSIS — C20 Malignant neoplasm of rectum: Secondary | ICD-10-CM

## 2018-04-13 ENCOUNTER — Other Ambulatory Visit (HOSPITAL_COMMUNITY): Payer: Self-pay | Admitting: Nurse Practitioner

## 2018-04-13 DIAGNOSIS — C7800 Secondary malignant neoplasm of unspecified lung: Principal | ICD-10-CM

## 2018-04-13 DIAGNOSIS — C20 Malignant neoplasm of rectum: Secondary | ICD-10-CM

## 2018-04-15 ENCOUNTER — Other Ambulatory Visit (HOSPITAL_COMMUNITY): Payer: Self-pay | Admitting: *Deleted

## 2018-04-15 DIAGNOSIS — C20 Malignant neoplasm of rectum: Secondary | ICD-10-CM

## 2018-04-15 DIAGNOSIS — C7800 Secondary malignant neoplasm of unspecified lung: Secondary | ICD-10-CM

## 2018-04-15 MED ORDER — CAPECITABINE 500 MG PO TABS: ORAL_TABLET | ORAL | 1 refills | Status: DC

## 2018-04-15 MED FILL — XELODA 500 MG TABLET: 500 | 21 days supply | Qty: 84 | Fill #0

## 2018-04-15 NOTE — Telephone Encounter (Signed)
Chart reviewed, xeloda refilled.

## 2018-04-20 ENCOUNTER — Inpatient Hospital Stay (HOSPITAL_COMMUNITY): Payer: Medicare Other | Attending: Hematology

## 2018-04-20 ENCOUNTER — Inpatient Hospital Stay (HOSPITAL_COMMUNITY): Payer: Medicare Other

## 2018-04-20 VITALS — BP 139/80 | HR 80 | Temp 98.0°F | Resp 18 | Wt 209.2 lb

## 2018-04-20 DIAGNOSIS — Z7901 Long term (current) use of anticoagulants: Secondary | ICD-10-CM | POA: Diagnosis not present

## 2018-04-20 DIAGNOSIS — Z9221 Personal history of antineoplastic chemotherapy: Secondary | ICD-10-CM | POA: Insufficient documentation

## 2018-04-20 DIAGNOSIS — C20 Malignant neoplasm of rectum: Secondary | ICD-10-CM | POA: Diagnosis not present

## 2018-04-20 DIAGNOSIS — C187 Malignant neoplasm of sigmoid colon: Secondary | ICD-10-CM

## 2018-04-20 DIAGNOSIS — Z5112 Encounter for antineoplastic immunotherapy: Secondary | ICD-10-CM | POA: Insufficient documentation

## 2018-04-20 DIAGNOSIS — Z86711 Personal history of pulmonary embolism: Secondary | ICD-10-CM | POA: Diagnosis not present

## 2018-04-20 DIAGNOSIS — E1140 Type 2 diabetes mellitus with diabetic neuropathy, unspecified: Secondary | ICD-10-CM | POA: Diagnosis not present

## 2018-04-20 DIAGNOSIS — G893 Neoplasm related pain (acute) (chronic): Secondary | ICD-10-CM | POA: Insufficient documentation

## 2018-04-20 DIAGNOSIS — C7889 Secondary malignant neoplasm of other digestive organs: Secondary | ICD-10-CM | POA: Insufficient documentation

## 2018-04-20 DIAGNOSIS — Z923 Personal history of irradiation: Secondary | ICD-10-CM | POA: Diagnosis not present

## 2018-04-20 DIAGNOSIS — Z79899 Other long term (current) drug therapy: Secondary | ICD-10-CM | POA: Insufficient documentation

## 2018-04-20 DIAGNOSIS — Z86718 Personal history of other venous thrombosis and embolism: Secondary | ICD-10-CM | POA: Insufficient documentation

## 2018-04-20 DIAGNOSIS — C7800 Secondary malignant neoplasm of unspecified lung: Secondary | ICD-10-CM | POA: Insufficient documentation

## 2018-04-20 LAB — URINALYSIS, DIPSTICK ONLY
Bilirubin Urine: NEGATIVE
Glucose, UA: NEGATIVE mg/dL
Hgb urine dipstick: NEGATIVE
Ketones, ur: NEGATIVE mg/dL
Leukocytes, UA: NEGATIVE
Nitrite: NEGATIVE
Protein, ur: 30 mg/dL — AB
Specific Gravity, Urine: 1.023 (ref 1.005–1.030)
pH: 5 (ref 5.0–8.0)

## 2018-04-20 LAB — CBC WITH DIFFERENTIAL/PLATELET
Abs Immature Granulocytes: 0.02 10*3/uL (ref 0.00–0.07)
Basophils Absolute: 0 10*3/uL (ref 0.0–0.1)
Basophils Relative: 0 %
Eosinophils Absolute: 0.2 10*3/uL (ref 0.0–0.5)
Eosinophils Relative: 2 %
HCT: 39.6 % (ref 39.0–52.0)
Hemoglobin: 13 g/dL (ref 13.0–17.0)
Immature Granulocytes: 0 %
Lymphocytes Relative: 30 %
Lymphs Abs: 2.1 10*3/uL (ref 0.7–4.0)
MCH: 34.5 pg — ABNORMAL HIGH (ref 26.0–34.0)
MCHC: 32.8 g/dL (ref 30.0–36.0)
MCV: 105 fL — ABNORMAL HIGH (ref 80.0–100.0)
Monocytes Absolute: 0.5 10*3/uL (ref 0.1–1.0)
Monocytes Relative: 7 %
Neutro Abs: 4.2 10*3/uL (ref 1.7–7.7)
Neutrophils Relative %: 61 %
Platelets: 208 10*3/uL (ref 150–400)
RBC: 3.77 MIL/uL — ABNORMAL LOW (ref 4.22–5.81)
RDW: 17.4 % — ABNORMAL HIGH (ref 11.5–15.5)
WBC: 6.9 10*3/uL (ref 4.0–10.5)
nRBC: 0 % (ref 0.0–0.2)

## 2018-04-20 LAB — COMPREHENSIVE METABOLIC PANEL
ALT: 28 U/L (ref 0–44)
AST: 24 U/L (ref 15–41)
Albumin: 3.8 g/dL (ref 3.5–5.0)
Alkaline Phosphatase: 74 U/L (ref 38–126)
Anion gap: 6 (ref 5–15)
BUN: 13 mg/dL (ref 8–23)
CO2: 23 mmol/L (ref 22–32)
Calcium: 8.8 mg/dL — ABNORMAL LOW (ref 8.9–10.3)
Chloride: 108 mmol/L (ref 98–111)
Creatinine, Ser: 0.86 mg/dL (ref 0.61–1.24)
GFR calc Af Amer: >60 mL/min (ref >60)
GFR calc non Af Amer: >60 mL/min (ref >60)
Glucose, Bld: 197 mg/dL — ABNORMAL HIGH (ref 70–99)
Potassium: 3.8 mmol/L (ref 3.5–5.1)
Sodium: 137 mmol/L (ref 135–145)
Total Bilirubin: 1.6 mg/dL — ABNORMAL HIGH (ref 0.3–1.2)
Total Protein: 6.5 g/dL (ref 6.5–8.1)

## 2018-04-20 LAB — MAGNESIUM: Magnesium: 1.6 mg/dL — ABNORMAL LOW (ref 1.7–2.4)

## 2018-04-20 MED ORDER — SODIUM CHLORIDE 0.9 % IV SOLN
700.0000 mg | Freq: Once | INTRAVENOUS | Status: AC
  Administered 2018-04-20: 700 mg via INTRAVENOUS
  Filled 2018-04-20: qty 12

## 2018-04-20 MED ORDER — HEPARIN SOD (PORK) LOCK FLUSH 100 UNIT/ML IV SOLN
500.0000 [IU] | Freq: Once | INTRAVENOUS | Status: AC | PRN
  Administered 2018-04-20: 500 [IU]

## 2018-04-20 MED ORDER — SODIUM CHLORIDE 0.9 % IV SOLN
Freq: Once | INTRAVENOUS | Status: AC
  Administered 2018-04-20: 14:00:00 via INTRAVENOUS

## 2018-04-20 MED ORDER — SODIUM CHLORIDE 0.9% FLUSH
10.0000 mL | INTRAVENOUS | Status: DC | PRN
  Administered 2018-04-20: 10 mL
  Filled 2018-04-20: qty 10

## 2018-04-20 NOTE — Progress Notes (Signed)
1315 labs reviewed with Dr. [name], including urine protien 30. Per MD, ok to proceed with Avastin today.   [name] tolerated Avastin without incident or complaint. VSS. Discharged self ambulatory in satisfactory condition in presence of wife.

## 2018-04-20 NOTE — Patient Instructions (Signed)
[facility] Discharge Instructions for Patients Receiving Chemotherapy   Beginning January 23rd 2017 lab work for the [facility]: Lambert Rhonda W will be done in the  Main lab at [facility] on 1st floor. If you have a lab appointment with the [facility] [facility] please come in thru the  Main Entrance and check in at the main information desk   Today you received the following chemotherapy agents Avastin  To help prevent nausea and vomiting after your treatment, we encourage you to take your nausea medication   If you develop nausea and vomiting, or diarrhea that is not controlled by your medication, call the clinic.  The clinic phone number is [phone]. Office hours are Monday-Friday 8:30am-5:00pm.  BELOW ARE SYMPTOMS THAT SHOULD BE REPORTED IMMEDIATELY:  *FEVER GREATER THAN 101.0 F  *CHILLS WITH OR WITHOUT FEVER  NAUSEA AND VOMITING THAT IS NOT CONTROLLED WITH YOUR NAUSEA MEDICATION  *UNUSUAL SHORTNESS OF BREATH  *UNUSUAL BRUISING OR BLEEDING  TENDERNESS IN MOUTH AND THROAT WITH OR WITHOUT PRESENCE OF ULCERS  *URINARY PROBLEMS  *BOWEL PROBLEMS  UNUSUAL RASH Items with * indicate a potential emergency and should be followed up as soon as possible. If you have an emergency after office hours please contact your primary care physician or go to the nearest emergency department.  Please call the clinic during office hours if you have any questions or concerns.   You may also contact the Patient Navigator at [phone] should you have any questions or need assistance in obtaining follow up care.      Resources For Cancer Patients and their Caregivers ? American Cancer Society: Can assist with transportation, wigs, general needs, runs Look Good Feel Better.        951-477-9324 ? Cancer Care: Provides financial assistance, online support groups, medication/co-pay assistance.  1-800-813-HOPE 782-861-4687) ? [place] Assists [place] cancer  patients and their families through emotional , educational and financial support.  425 583 2442 ? [place] DSS Where to apply for food stamps, Medicaid and utility assistance. 234-749-2939 ? [employer]: Transportation to medical appointments. 224-250-9187 ? Social Security Administration: May apply for disability if have a Stage IV cancer. 725 [phone] [phone] ? [employer], Disability and Transit Services: Assists with nutrition, care and transit needs. 9845070524

## 2018-04-23 ENCOUNTER — Other Ambulatory Visit (HOSPITAL_COMMUNITY): Payer: Self-pay | Admitting: Hematology

## 2018-04-23 DIAGNOSIS — T451X5A Adverse effect of antineoplastic and immunosuppressive drugs, initial encounter: Principal | ICD-10-CM

## 2018-04-23 DIAGNOSIS — G620 Drug-induced polyneuropathy: Secondary | ICD-10-CM

## 2018-04-27 NOTE — Progress Notes (Signed)
This encounter was created in error - please disregard.

## 2018-04-29 ENCOUNTER — Telehealth: Payer: Self-pay | Admitting: *Deleted

## 2018-04-29 NOTE — Telephone Encounter (Signed)
Spouse called this office (Dr. Irene Limbo) and expressed concern that Neurontin not filled when requested earlier this week. Stated patient only had this morning's dose. Advised her request would be reviewed and she would be contacted with outcome.  Chart reviewed: patient sees Dr. [name] at [facility] for care. Gabapentin last prescribed 01/25/18 with one refill - sent to [facility] in [place]. Contacted [facility] in [place] - spoke with [place], identified patient and prescriber and that spouse had contacted this office regarding refill.  Dawn stated patient appeared to have refill remaining on med and that she would follow up with patient/spouse. Advised Dawn that any prescriber questions go to Dr. Tomie China office at [facility].   Attempted to contact spouse, left Voice mail with above information. Message will be forwarded to Dr. [name]

## 2018-05-07 ENCOUNTER — Telehealth (HOSPITAL_COMMUNITY): Payer: Self-pay | Admitting: Pharmacist

## 2018-05-07 MED FILL — XELODA 500 MG TABLET: 500 | 21 days supply | Qty: 84 | Fill #1

## 2018-05-07 NOTE — Telephone Encounter (Signed)
Oral Chemotherapy Pharmacist Encounter  Successfully enrolled patient for copayment assistance funds from [facility] from the Colorectal Cancer fund.  Award amount: $2,700 Effective dates: 02/06/2018 - 05/07/2019 ID: [id number] BIN: [id number] Group: [id number] PCN: [id number]  Billing information will be shared with [facility]. I will place a copy of the award letter to be scanned into patient's chart.  Darl Pikes, PharmD, BCPS Hematology/Oncology Clinical Pharmacist [facility]/HP/AP [facility] [phone]  05/07/2018 10:23 AM

## 2018-05-11 ENCOUNTER — Inpatient Hospital Stay (HOSPITAL_COMMUNITY): Payer: Medicare Other | Attending: Hematology

## 2018-05-11 ENCOUNTER — Encounter (HOSPITAL_COMMUNITY): Payer: Self-pay

## 2018-05-11 ENCOUNTER — Inpatient Hospital Stay (HOSPITAL_COMMUNITY): Payer: Medicare Other

## 2018-05-11 VITALS — BP 141/79 | HR 74 | Temp 98.1°F | Resp 16 | Wt 210.0 lb

## 2018-05-11 DIAGNOSIS — Z5112 Encounter for antineoplastic immunotherapy: Secondary | ICD-10-CM | POA: Diagnosis not present

## 2018-05-11 DIAGNOSIS — C20 Malignant neoplasm of rectum: Secondary | ICD-10-CM | POA: Diagnosis not present

## 2018-05-11 DIAGNOSIS — Z923 Personal history of irradiation: Secondary | ICD-10-CM | POA: Diagnosis not present

## 2018-05-11 DIAGNOSIS — G893 Neoplasm related pain (acute) (chronic): Secondary | ICD-10-CM | POA: Insufficient documentation

## 2018-05-11 DIAGNOSIS — Z9221 Personal history of antineoplastic chemotherapy: Secondary | ICD-10-CM | POA: Diagnosis not present

## 2018-05-11 DIAGNOSIS — Z86718 Personal history of other venous thrombosis and embolism: Secondary | ICD-10-CM | POA: Insufficient documentation

## 2018-05-11 DIAGNOSIS — C187 Malignant neoplasm of sigmoid colon: Secondary | ICD-10-CM

## 2018-05-11 DIAGNOSIS — C7800 Secondary malignant neoplasm of unspecified lung: Secondary | ICD-10-CM

## 2018-05-11 LAB — CBC WITH DIFFERENTIAL/PLATELET
Abs Immature Granulocytes: 0.02 10*3/uL (ref 0.00–0.07)
Basophils Absolute: 0 10*3/uL (ref 0.0–0.1)
Basophils Relative: 0 %
Eosinophils Absolute: 0.2 10*3/uL (ref 0.0–0.5)
Eosinophils Relative: 2 %
HCT: 39.4 % (ref 39.0–52.0)
Hemoglobin: 12.8 g/dL — ABNORMAL LOW (ref 13.0–17.0)
Immature Granulocytes: 0 %
Lymphocytes Relative: 24 %
Lymphs Abs: 1.7 10*3/uL (ref 0.7–4.0)
MCH: 34 pg (ref 26.0–34.0)
MCHC: 32.5 g/dL (ref 30.0–36.0)
MCV: 104.8 fL — ABNORMAL HIGH (ref 80.0–100.0)
Monocytes Absolute: 0.6 10*3/uL (ref 0.1–1.0)
Monocytes Relative: 9 %
Neutro Abs: 4.6 10*3/uL (ref 1.7–7.7)
Neutrophils Relative %: 65 %
Platelets: 182 10*3/uL (ref 150–400)
RBC: 3.76 MIL/uL — ABNORMAL LOW (ref 4.22–5.81)
RDW: 17 % — ABNORMAL HIGH (ref 11.5–15.5)
WBC: 7.1 10*3/uL (ref 4.0–10.5)
nRBC: 0 % (ref 0.0–0.2)

## 2018-05-11 LAB — COMPREHENSIVE METABOLIC PANEL
ALT: 31 U/L (ref 0–44)
AST: 30 U/L (ref 15–41)
Albumin: 3.6 g/dL (ref 3.5–5.0)
Alkaline Phosphatase: 92 U/L (ref 38–126)
Anion gap: 6 (ref 5–15)
BUN: 11 mg/dL (ref 8–23)
CO2: 23 mmol/L (ref 22–32)
Calcium: 8.6 mg/dL — ABNORMAL LOW (ref 8.9–10.3)
Chloride: 111 mmol/L (ref 98–111)
Creatinine, Ser: 0.82 mg/dL (ref 0.61–1.24)
GFR calc Af Amer: >60 mL/min (ref >60)
GFR calc non Af Amer: >60 mL/min (ref >60)
Glucose, Bld: 171 mg/dL — ABNORMAL HIGH (ref 70–99)
Potassium: 3.8 mmol/L (ref 3.5–5.1)
Sodium: 140 mmol/L (ref 135–145)
Total Bilirubin: 1.5 mg/dL — ABNORMAL HIGH (ref 0.3–1.2)
Total Protein: 6.1 g/dL — ABNORMAL LOW (ref 6.5–8.1)

## 2018-05-11 LAB — URINALYSIS, DIPSTICK ONLY
Bilirubin Urine: NEGATIVE
Glucose, UA: NEGATIVE mg/dL
Ketones, ur: NEGATIVE mg/dL
Leukocytes, UA: NEGATIVE
Nitrite: NEGATIVE
Protein, ur: 30 mg/dL — AB
Specific Gravity, Urine: 1.023 (ref 1.005–1.030)
pH: 5 (ref 5.0–8.0)

## 2018-05-11 MED ORDER — SODIUM CHLORIDE 0.9 % IV SOLN
Freq: Once | INTRAVENOUS | Status: AC
  Administered 2018-05-11: 14:00:00 via INTRAVENOUS

## 2018-05-11 MED ORDER — SODIUM CHLORIDE 0.9 % IV SOLN
700.0000 mg | Freq: Once | INTRAVENOUS | Status: AC
  Administered 2018-05-11: 700 mg via INTRAVENOUS
  Filled 2018-05-11: qty 16

## 2018-05-11 MED ORDER — SODIUM CHLORIDE 0.9% FLUSH
10.0000 mL | INTRAVENOUS | Status: DC | PRN
  Administered 2018-05-11: 10 mL
  Filled 2018-05-11: qty 10

## 2018-05-11 MED ORDER — HEPARIN SOD (PORK) LOCK FLUSH 100 UNIT/ML IV SOLN
500.0000 [IU] | Freq: Once | INTRAVENOUS | Status: AC | PRN
  Administered 2018-05-11: 500 [IU]

## 2018-05-11 NOTE — Progress Notes (Signed)
[name] tolerated Avastin infusion well without complaints or incident. Lab and urine results reviewed prior to administering this medication. VSS upon discharge. Pt discharged self ambulatory in satisfactory condition accompanied by his wife

## 2018-05-11 NOTE — Progress Notes (Signed)
Pt continues to take his Xeloda as prescribed without any issues or missed doses

## 2018-05-11 NOTE — Patient Instructions (Signed)
[facility] Discharge Instructions for Patients Receiving Chemotherapy   Beginning January 23rd 2017 lab work for the [facility] will be done in the  Main lab at [facility] on 1st floor. If you have a lab appointment with the [facility] please come in thru the  Main Entrance and check in at the main information desk   Today you received the following chemotherapy agents Avastin. Follow-up as scheduled. Call clinic for any questions or concerns  To help prevent nausea and vomiting after your treatment, we encourage you to take your nausea medication.   If you develop nausea and vomiting, or diarrhea that is not controlled by your medication, call the clinic.  The clinic phone number is [phone]. Office hours are Monday-Friday 8:30am-5:00pm.  BELOW ARE SYMPTOMS THAT SHOULD BE REPORTED IMMEDIATELY:  *FEVER GREATER THAN 101.0 F  *CHILLS WITH OR WITHOUT FEVER  NAUSEA AND VOMITING THAT IS NOT CONTROLLED WITH YOUR NAUSEA MEDICATION  *UNUSUAL SHORTNESS OF BREATH  *UNUSUAL BRUISING OR BLEEDING  TENDERNESS IN MOUTH AND THROAT WITH OR WITHOUT PRESENCE OF ULCERS  *URINARY PROBLEMS  *BOWEL PROBLEMS  UNUSUAL RASH Items with * indicate a potential emergency and should be followed up as soon as possible. If you have an emergency after office hours please contact your primary care physician or go to the nearest emergency department.  Please call the clinic during office hours if you have any questions or concerns.   You may also contact the Patient Navigator at [phone] should you have any questions or need assistance in obtaining follow up care.      Resources For Cancer Patients and their Caregivers ? American Cancer Society: Can assist with transportation, wigs, general needs, runs Look Good Feel Better.        (585)686-7351 ? Cancer Care: Provides financial assistance, online support groups, medication/co-pay assistance.  1-800-813-HOPE  212-550-6277) ? [place] Assists [place] cancer patients and their families through emotional , educational and financial support.  305-242-9995 ? [place] DSS Where to apply for food stamps, Medicaid and utility assistance. 601-744-7392 ? [employer]: Transportation to medical appointments. 531-337-2963 ? Social Security Administration: May apply for disability if have a Stage IV cancer. 719-756-5254 608-776-8945 ? [employer], Disability and Transit Services: Assists with nutrition, care and transit needs. (651)768-3404

## 2018-05-17 DIAGNOSIS — C20 Malignant neoplasm of rectum: Secondary | ICD-10-CM | POA: Diagnosis not present

## 2018-05-17 DIAGNOSIS — Z6825 Body mass index (BMI) 25.0-25.9, adult: Secondary | ICD-10-CM | POA: Diagnosis not present

## 2018-05-17 DIAGNOSIS — E1165 Type 2 diabetes mellitus with hyperglycemia: Secondary | ICD-10-CM | POA: Diagnosis not present

## 2018-05-17 DIAGNOSIS — C189 Malignant neoplasm of colon, unspecified: Secondary | ICD-10-CM | POA: Diagnosis not present

## 2018-05-17 DIAGNOSIS — C799 Secondary malignant neoplasm of unspecified site: Secondary | ICD-10-CM | POA: Diagnosis not present

## 2018-05-17 DIAGNOSIS — I10 Essential (primary) hypertension: Secondary | ICD-10-CM | POA: Diagnosis not present

## 2018-05-17 DIAGNOSIS — Z299 Encounter for prophylactic measures, unspecified: Secondary | ICD-10-CM | POA: Diagnosis not present

## 2018-05-20 ENCOUNTER — Other Ambulatory Visit (HOSPITAL_COMMUNITY): Payer: Self-pay | Admitting: Hematology

## 2018-05-20 DIAGNOSIS — C20 Malignant neoplasm of rectum: Secondary | ICD-10-CM

## 2018-05-20 DIAGNOSIS — C7800 Secondary malignant neoplasm of unspecified lung: Secondary | ICD-10-CM

## 2018-05-25 ENCOUNTER — Other Ambulatory Visit (HOSPITAL_COMMUNITY): Payer: Self-pay | Admitting: *Deleted

## 2018-05-25 DIAGNOSIS — C7800 Secondary malignant neoplasm of unspecified lung: Secondary | ICD-10-CM

## 2018-05-25 DIAGNOSIS — C20 Malignant neoplasm of rectum: Secondary | ICD-10-CM

## 2018-05-25 MED ORDER — CAPECITABINE 500 MG PO TABS: ORAL_TABLET | ORAL | 1 refills | Status: DC

## 2018-05-25 NOTE — Telephone Encounter (Signed)
Chart reviewed, xeloda refilled.

## 2018-05-26 ENCOUNTER — Ambulatory Visit (HOSPITAL_COMMUNITY): Admission: RE | Admit: 2018-05-26 | Payer: Medicare Other | Source: Ambulatory Visit

## 2018-05-27 MED FILL — XELODA 500 MG TABLET: 500 | 21 days supply | Qty: 84 | Fill #0

## 2018-05-28 ENCOUNTER — Ambulatory Visit (HOSPITAL_COMMUNITY)
Admission: RE | Admit: 2018-05-28 | Discharge: 2018-05-28 | Disposition: A | Discharge status: Home / Self Care | Payer: Medicare Other | Source: Ambulatory Visit | Attending: Nurse Practitioner | Admitting: Nurse Practitioner

## 2018-05-28 ENCOUNTER — Encounter (HOSPITAL_COMMUNITY): Payer: Self-pay

## 2018-05-28 DIAGNOSIS — C7800 Secondary malignant neoplasm of unspecified lung: Principal | ICD-10-CM

## 2018-05-28 DIAGNOSIS — C20 Malignant neoplasm of rectum: Secondary | ICD-10-CM

## 2018-05-28 DIAGNOSIS — C7889 Secondary malignant neoplasm of other digestive organs: Secondary | ICD-10-CM | POA: Diagnosis not present

## 2018-05-28 MED ORDER — IOPAMIDOL (ISOVUE-300) INJECTION 61%
100.0000 mL | Freq: Once | INTRAVENOUS | Status: AC | PRN
  Administered 2018-05-28: 100 mL via INTRAVENOUS

## 2018-06-01 ENCOUNTER — Encounter (HOSPITAL_COMMUNITY): Payer: Self-pay | Admitting: Hematology

## 2018-06-01 ENCOUNTER — Inpatient Hospital Stay (HOSPITAL_COMMUNITY): Payer: Medicare Other

## 2018-06-01 ENCOUNTER — Inpatient Hospital Stay (HOSPITAL_BASED_OUTPATIENT_CLINIC_OR_DEPARTMENT_OTHER): Payer: Medicare Other | Admitting: Hematology

## 2018-06-01 ENCOUNTER — Other Ambulatory Visit: Payer: Self-pay

## 2018-06-01 VITALS — BP 145/78 | HR 80 | Temp 97.6°F | Resp 16

## 2018-06-01 DIAGNOSIS — G893 Neoplasm related pain (acute) (chronic): Secondary | ICD-10-CM | POA: Diagnosis not present

## 2018-06-01 DIAGNOSIS — C20 Malignant neoplasm of rectum: Secondary | ICD-10-CM | POA: Diagnosis not present

## 2018-06-01 DIAGNOSIS — Z5112 Encounter for antineoplastic immunotherapy: Secondary | ICD-10-CM | POA: Diagnosis not present

## 2018-06-01 DIAGNOSIS — Z9221 Personal history of antineoplastic chemotherapy: Secondary | ICD-10-CM | POA: Diagnosis not present

## 2018-06-01 DIAGNOSIS — C7800 Secondary malignant neoplasm of unspecified lung: Principal | ICD-10-CM

## 2018-06-01 DIAGNOSIS — Z923 Personal history of irradiation: Secondary | ICD-10-CM

## 2018-06-01 DIAGNOSIS — Z86718 Personal history of other venous thrombosis and embolism: Secondary | ICD-10-CM | POA: Diagnosis not present

## 2018-06-01 LAB — COMPREHENSIVE METABOLIC PANEL
ALT: 19 U/L (ref 0–44)
AST: 21 U/L (ref 15–41)
Albumin: 3.6 g/dL (ref 3.5–5.0)
Alkaline Phosphatase: 81 U/L (ref 38–126)
Anion gap: 7 (ref 5–15)
BUN: 16 mg/dL (ref 8–23)
CO2: 23 mmol/L (ref 22–32)
Calcium: 8.7 mg/dL — ABNORMAL LOW (ref 8.9–10.3)
Chloride: 106 mmol/L (ref 98–111)
Creatinine, Ser: 0.81 mg/dL (ref 0.61–1.24)
GFR calc Af Amer: >60 mL/min (ref >60)
GFR calc non Af Amer: >60 mL/min (ref >60)
Glucose, Bld: 284 mg/dL — ABNORMAL HIGH (ref 70–99)
Potassium: 4.5 mmol/L (ref 3.5–5.1)
Sodium: 136 mmol/L (ref 135–145)
Total Bilirubin: 1.3 mg/dL — ABNORMAL HIGH (ref 0.3–1.2)
Total Protein: 6.3 g/dL — ABNORMAL LOW (ref 6.5–8.1)

## 2018-06-01 LAB — CBC WITH DIFFERENTIAL/PLATELET
Abs Immature Granulocytes: 0.01 10*3/uL (ref 0.00–0.07)
Basophils Absolute: 0 10*3/uL (ref 0.0–0.1)
Basophils Relative: 0 %
Eosinophils Absolute: 0.2 10*3/uL (ref 0.0–0.5)
Eosinophils Relative: 3 %
HCT: 41.5 % (ref 39.0–52.0)
Hemoglobin: 13.4 g/dL (ref 13.0–17.0)
Immature Granulocytes: 0 %
Lymphocytes Relative: 27 %
Lymphs Abs: 1.8 10*3/uL (ref 0.7–4.0)
MCH: 33.7 pg (ref 26.0–34.0)
MCHC: 32.3 g/dL (ref 30.0–36.0)
MCV: 104.3 fL — ABNORMAL HIGH (ref 80.0–100.0)
Monocytes Absolute: 0.5 10*3/uL (ref 0.1–1.0)
Monocytes Relative: 7 %
Neutro Abs: 4.3 10*3/uL (ref 1.7–7.7)
Neutrophils Relative %: 63 %
Platelets: 217 10*3/uL (ref 150–400)
RBC: 3.98 MIL/uL — ABNORMAL LOW (ref 4.22–5.81)
RDW: 16.4 % — ABNORMAL HIGH (ref 11.5–15.5)
WBC: 6.8 10*3/uL (ref 4.0–10.5)
nRBC: 0 % (ref 0.0–0.2)

## 2018-06-01 LAB — MAGNESIUM: Magnesium: 1.7 mg/dL (ref 1.7–2.4)

## 2018-06-01 MED ORDER — MAGNESIUM OXIDE 400 (241.3 MG) MG PO TABS: 400.0000 mg | ORAL_TABLET | Freq: Two times a day (BID) | ORAL | 3 refills | Status: DC

## 2018-06-01 MED ORDER — SODIUM CHLORIDE 0.9 % IV SOLN
700.0000 mg | Freq: Once | INTRAVENOUS | Status: AC
  Administered 2018-06-01: 700 mg via INTRAVENOUS
  Filled 2018-06-01: qty 16

## 2018-06-01 MED ORDER — HEPARIN SOD (PORK) LOCK FLUSH 100 UNIT/ML IV SOLN
500.0000 [IU] | Freq: Once | INTRAVENOUS | Status: AC | PRN
  Administered 2018-06-01: 500 [IU]

## 2018-06-01 MED ORDER — SODIUM CHLORIDE 0.9% FLUSH
10.0000 mL | INTRAVENOUS | Status: DC | PRN
  Administered 2018-06-01: 10 mL
  Filled 2018-06-01: qty 10

## 2018-06-01 MED ORDER — SODIUM CHLORIDE 0.9 % IV SOLN
Freq: Once | INTRAVENOUS | Status: AC
  Administered 2018-06-01: 12:00:00 via INTRAVENOUS

## 2018-06-01 MED ORDER — MIRTAZAPINE 7.5 MG PO TABS: 7.5000 mg | ORAL_TABLET | Freq: Every day | ORAL | 0 refills | Status: DC

## 2018-06-01 NOTE — Progress Notes (Signed)
[place], [place] [address]   CLINIC:  Medical Oncology/Hematology  PCP:  [name], MD [place] [id number] [phone]   REASON FOR VISIT: Follow-up for rectal cancer  CURRENT THERAPY: Xeloda tabs 2 weeks on 1 week off and Bevacizumab  BRIEF ONCOLOGIC HISTORY:    Rectal cancer ([place])   07/24/2010 Initial Diagnosis    Invasive adenocarcinoma of rectum    09/09/2010 Concurrent Chemotherapy    S/P radiation and concurrent 5-FU continuous infusion from 09/09/10- 10/10/10.    11/14/2010 Surgery    S/P proctectomy with colorectal anastomosis and diverting loop ileostomy on 11/14/10 at [facility] by Dr. Harlon Ditty. Pathology reveals a pT3b N1 with 3/20 lymph nodes.    02/05/2011 - 07/14/2011 Chemotherapy    FOLFOX    08/18/2011 Surgery    Approximate date of surgery- [place] by Dr. Harlon Ditty     Remission       11/24/2013 Survivorship    Colonoscopy- somewhat fibrotic/friable anastomotic mucosal-status post biopsy (narrowing not felt to be clinically significant).  Negative pathology for malignancy    09/12/2014 Imaging    DVT in the left femoral venous system, left common iliac vein, IVC, and within the IVC filter Right lower extremity venography confirms chronic occlusion of the femoral venous system with collateralization. The right iliac venous system is patent and do    09/25/2014 PET scan    The right middle lobe pulmonary nodule is hypermetabolic, favored to represent a primary bronchogenic carcinoma.Equivocal mediastinal nodes, similar to surrounding blood pool. Bilateral adrenal hypermetabolism, felt to be physiologic    10/24/2014 Pathology Results    Lung, needle/core biopsy(ies), RML - ADENOCARCINOMA, SEE COMMENT metastatic adenocarcinoma of a colorectal primary    10/24/2014 Relapse/Recurrence       11/16/2014 Definitive Surgery    Bronchoscopy, right video-assisted thoracoscopy, wedge resection of right middle  lobe by Dr. Servando Snare    11/16/2014 Pathology Results    Lung, wedge biopsy/resection, right lingula and small portion of middle lobe - METASTATIC ADENOCARCINOMA, CONSISTENT WITH COLORECTAL PRIMARY, SPANNING 2.0 CM. - THE SURGICAL RESECTION MARGINS ARE NEGATIVE FOR ADENOCARCINOMA.    11/16/2014 Remission    THE SURGICAL RESECTION MARGINS ARE NEGATIVE FOR ADENOCARCINOMA.    03/07/2015 Imaging    CT CAP- Interval resection of right middle lobe metastasis. No acute process or evidence of metastatic disease in the chest, abdomen or pelvis. Improved right upper lobe reticular nodular opacities are favored to represent resolving infection.    09/14/2015 Imaging    CT CAP- No findings of recurrent malignancy. No recurrence along the wedge resection site of the right middle lobe. Right anterior abdominal wall focal hernia containing a knuckle of small bowel without complicating feature.    06/13/2016 Imaging    CT CAP- 1. New right-sided pleural metastasis/mass. Other smaller right-sided pulmonary nodules which are all pleural-based and most likely represent pleural metastasis. 2. No convincing evidence of abdominopelvic nodal metastasis. 3. Constellation of findings, including pancreatic atrophy, duct dilatation, and pancreatic head soft tissue fullness which are highly suspicious for pancreatic adenocarcinoma. Metastatic disease felt much less likely. Consider endoscopic ultrasound sampling or ERCP. Cannot exclude superimposed acute pancreatitis. 4. New enlargement of the appendiceal tip with subtle surrounding edema. Cannot exclude early or mild appendicitis. 5. Coronary artery atherosclerosis. Aortic atherosclerosis. 6. 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    06/13/2016 Progression    CT scan demonstrates progression of  disease    06/19/2016 Procedure    EUS with FNA by Dr. Ardis Hughs    06/20/2016 Pathology Results    FINE NEEDLE ASPIRATION, ENDOSCOPIC, PANCREAS  UNCINATE AREA(SPECIMEN 1 OF 1 COLLECTED 06/19/16): MALIGNANT CELLS CONSISTENT WITH METASTATIC ADENOCARCINOMA.    06/25/2016 PET scan    1. Two pleural-based nodules in the right hemithorax are hypermetabolic. Metastatic disease is a distinct consideration. The scattered pulmonary parenchymal nodule seen on previous diagnostic CT imaging are below the threshold for reliable resolution on PET imaging. 2. Hypermetabolic lesion pancreatic head, consistent with neoplasm. As noted on prior CT, adenocarcinoma is any consideration. No evidence for hypermetabolic abdominal lymphadenopathy. Mottled uptake noted in the liver, but no discrete hepatic metastases are evident on PET imaging. 3. Appendix remains distended up to 0.9-10 mm diameter with a stone towards the tip in subtle periappendiceal edema/inflammation. The appendix is hypermetabolic along its length. Imaging features are relatively stable in the 12 day interval since prior CT scan. While appendicitis is a consideration, the relative stability over 12 days would be unusual for that etiology. Continued close follow-up recommended.    06/26/2016 Pathology Results    Not enough tissue for foundationONE or K-ras testing.    07/07/2016 Procedure    Port placed by Dr. Arnoldo Morale    07/08/2016 -  Chemotherapy    The patient had pegfilgrastim (NEULASTA) injection 6 mg, 6 mg, Subcutaneous,  Once, 0 of 4 cycles  ondansetron (ZOFRAN) IVPB 8 mg, 8 mg, Intravenous,  Once, 0 of 4 cycles  leucovorin 804 mg in dextrose 5 % 250 mL infusion, 400 mg/m2, Intravenous,  Once, 0 of 4 cycles  oxaliplatin (ELOXATIN) 170 mg in dextrose 5 % 500 mL chemo infusion, 85 mg/m2, Intravenous,  Once, 0 of 4 cycles  fluorouracil (ADRUCIL) 4,800 mg in sodium chloride 0.9 % 150 mL chemo infusion, 2,400 mg/m2 = 4,800 mg, Intravenous, 1 Day/Dose, 0 of 4 cycles  fluorouracil (ADRUCIL) chemo injection 800 mg, 400 mg/m2, Intravenous,  Once, 0 of 4 cycles  pegfilgrastim  (NEULASTA) injection 6 mg, 6 mg, Subcutaneous,  Once, 2 of 2 cycles  ondansetron (ZOFRAN) IVPB 8 mg, 8 mg, Intravenous,  Once, 12 of 12 cycles  leucovorin 660 mg in dextrose 5 % 250 mL infusion, 660 mg (original dose ), Intravenous,  Once, 12 of 12 cycles Dose modification: 660 mg (Cycle 1)  oxaliplatin (ELOXATIN) 140 mg in dextrose 5 % 500 mL chemo infusion, 140 mg (original dose ), Intravenous,  Once, 12 of 12 cycles Dose modification: 140 mg (Cycle 1), 119 mg (85 % of original dose 140 mg, Cycle 8, Reason: Provider Judgment)  fluorouracil (ADRUCIL) 3,850 mg in sodium chloride 0.9 % 150 mL chemo infusion, 3,850 mg (original dose ), Intravenous, 1 Day/Dose, 12 of 12 cycles Dose modification: 3,850 mg (Cycle 1)  fluorouracil (ADRUCIL) chemo injection 700 mg, 700 mg (original dose ), Intravenous,  Once, 12 of 12 cycles Dose modification: 700 mg (Cycle 1)  palonosetron (ALOXI) injection 0.25 mg, 0.25 mg, Intravenous,  Once, 7 of 12 cycles Administration: 0.25 mg (09/30/2016)  bevacizumab (AVASTIN) 475 mg in sodium chloride 0.9 % 100 mL chemo infusion, 5 mg/kg = 475 mg, Intravenous,  Once, 6 of 11 cycles Administration: 500 mg (09/30/2016)  leucovorin 880 mg in dextrose 5 % 250 mL infusion, 400 mg/m2 = 880 mg, Intravenous,  Once, 7 of 12 cycles Administration: 880 mg (09/30/2016)  oxaliplatin (ELOXATIN) 185 mg in dextrose 5 % 500 mL chemo infusion, 85 mg/m2 = 185  mg, Intravenous,  Once, 7 of 12 cycles Administration: 185 mg (09/30/2016)  fluorouracil (ADRUCIL) chemo injection 900 mg, 400 mg/m2 = 900 mg, Intravenous,  Once, 7 of 12 cycles Administration: 900 mg (09/30/2016)  fluorouracil (ADRUCIL) 5,300 mg in sodium chloride 0.9 % 144 mL chemo infusion, 2,400 mg/m2 = 5,300 mg, Intravenous, 1 Day/Dose, 7 of 12 cycles Administration: 5,300 mg (09/30/2016)  for chemotherapy treatment.      08/27/2016 Imaging    CT CAP- 1. Pulmonary metastatic disease is stable. 2. Pancreatic head mass,  grossly stable, with progressive atrophy of the body and tail of the pancreas. Stable peripancreatic lymph node. 3. Mild circumferential rectal wall thickening, stable. 4. Aortic atherosclerosis (ICD10-170.0). Coronary artery calcification. 5. Hyperattenuating lesion off the lower pole left kidney, stable, too small to characterize. Continued attention on followup exams is warranted. 6. Persistent wall thickening and mild dilatation of the proximal appendix, of uncertain etiology. Continued attention on followup exams is warranted.    11/06/2016 Imaging    CT C/A/P: IMPRESSION: 1. Stable pulmonary metastatic disease. 2. Similar appearing pancreatic head mass. Progressed atrophy of the pancreatic body and tail. Stable peripancreatic lymph node. 3. Stable hypoattenuating lesion partially exophytic off the inferior pole of the left kidney. 4. Aortic atherosclerosis. 5. Persistent mild wall thickening and mild dilatation of the appendix.     01/30/2017 Imaging    Ct C/A/P: IMPRESSION: 1. Stable pulmonary metastatic disease. 2. Stable to slight increase in size of pancreatic head mass. 3.  Aortic Atherosclerosis (ICD10-I70.0). 4. Similar appearance of mild wall thickening of the appendix which contains an appendicolith.    05/01/2017 Imaging    CT C/A/P: Stable disease in the lungs, no progression of disease in the abdomen.      CANCER STAGING: Cancer Staging Rectal cancer ([place]) Staging form: Colon and Rectum, AJCC 7th Edition - Clinical: Stage IIIB (T3, N1, M0) - Signed by [name], MD on 02/04/2011 - Pathologic stage from 11/17/2014: Stage IVA (M1a) - Signed by Baird Cancer, PA-C on 09/14/2015    INTERVAL HISTORY:  [name] 68 y.o. male returns for routine follow-up for rectal cancer. He reports he has more diarrhea and his appetite has decreased. He take imodium at least one a day to stop it. He has lost 7 pounds since his last visit. He is drinking  ensure daily. Denies any nausea, vomiting, or diarrhea. Denies any new pains. Had not noticed any recent bleeding such as epistaxis, hematuria or hematochezia. Denies recent chest pain on exertion, shortness of breath on minimal exertion, pre-syncopal episodes, or palpitations. Denies any numbness or tingling in hands or feet. Denies any recent fevers, infections, or recent hospitalizations. Patient reports appetite at 50% and energy level at 50%.   REVIEW OF SYSTEMS:  Review of Systems  Gastrointestinal: Positive for diarrhea.  Psychiatric/Behavioral: Positive for sleep disturbance.  All other systems reviewed and are negative.    PAST MEDICAL/SURGICAL HISTORY:  Past Medical History:  Diagnosis Date  . Chronic anticoagulation   . Colon cancer ([place]) 07/24/2010   rectal ca, inv adenocarcinoma  . Depression 04/01/2011  . Diabetes mellitus without complication ([place])   . DVT (deep venous thrombosis) ([place]) 05/09/2011  . GERD (gastroesophageal reflux disease)   . High output ileostomy ([place]) 05/21/2011  . History of kidney stones   . HTN (hypertension)   . Hx of radiation therapy 09/02/10 to 10/14/10   pelvis  . Lung metastasis ([place])   . Neuropathy   . Peripheral vascular disease (  [place])    dvt's,pe  . Pneumonia    hx x3  . Pulmonary embolism ([place])   . Rectal cancer ([place]) 01/15/2011   S/P radiation and concurrent 5-FU continuous infusion from 09/09/10- 10/10/10.  S/P proctectomy with colorectal anastomosis and diverting loop ileostomy on 11/14/10 at [facility] by Dr. Harlon Ditty. Pathology reveals a pT3b N1 with 3/20 lymph nodes.     Past Surgical History:  Procedure Laterality Date  . COLON SURGERY  11/14/2010   proctectomy with colorectal anastomosis and diverting loop ileostomy (temporary planned)  . COLONOSCOPY  07/2010   proximal rectal apple core mass 10-14cm from anal verge (adenocarcinoma), 2-3cm distal rectal carpet polyp s/p piecemeal snare polypectomy (adenoma)  . COLONOSCOPY   04/21/2012   RMR: Friable,fibrotic appearing colorectal anastomosis producing some luminal narrowing-not felt to be critical. path: focal erosion with slight inflammation and hyperemia. SURVEILLANCE DUE DEC 2015  . COLONOSCOPY N/A 11/24/2013   Dr. Rourk:somewhat fibrotic/friable anastomotic mucosal-status post biopsy (narrowing not felt to be clinically significant) Single colonic diverticulum. benign polypoid rectal mucosa  . colostomy reversal  april 2013  . ESOPHAGOGASTRODUODENOSCOPY  07/2010   RMR: schatki ring s/p dilation, small hh, SB bx benign  . ESOPHAGOGASTRODUODENOSCOPY N/A 09/04/2015   Procedure: ESOPHAGOGASTRODUODENOSCOPY (EGD);  Surgeon: Daneil Dolin, MD;  Location: [facility] ENDO SUITE;  Service: Endoscopy;  Laterality: N/A;  730  . EUS  08/2010   Dr. Owens Loffler. uT3N0 circumferential, nearly obstruction rectosigmoid adenocarcinoma, distal edge 12cm from anal verge  . EUS N/A 06/19/2016   Procedure: UPPER ENDOSCOPIC ULTRASOUND (EUS) RADIAL;  Surgeon: Milus Banister, MD;  Location: [facility] ENDOSCOPY;  Service: Endoscopy;  Laterality: N/A;  . HERNIA REPAIR     abd hernia repair  . IVC filter    . ivc filter    . port a cath placement    . PORT-A-CATH REMOVAL  09/24/2011   Procedure: REMOVAL PORT-A-CATH;  Surgeon: Donato Heinz, MD;  Location: [facility] ORS;  Service: General;  Laterality: N/A;  Minor Room  . PORTACATH PLACEMENT Right 07/07/2016   Procedure: INSERTION PORT-A-CATH;  Surgeon: Aviva Signs, MD;  Location: [facility] ORS;  Service: General;  Laterality: Right;  Marland Kitchen VIDEO ASSISTED THORACOSCOPY (VATS)/WEDGE RESECTION Right 11/15/2014   Procedure: VIDEO ASSISTED THORACOSCOPY (VATS)/LUNG RESECTION WITH RIGHT LINGULECTOMY;  Surgeon: [name], MD;  Location: [facility];  Service: Thoracic;  Laterality: Right;  Marland Kitchen VIDEO BRONCHOSCOPY N/A 11/15/2014   Procedure: VIDEO BRONCHOSCOPY;  Surgeon: [name], MD;  [facility];  Service: Thoracic;  Laterality: N/A;     SOCIAL HISTORY:  Social  History   Socioeconomic History  . Marital status: Married    Spouse name: Not on file  . Number of children: 2  . Years of education: Not on file  . Highest education level: Not on file  Occupational History  . Occupation: self-employed Regulatory affairs officer, currently not working    Fish farm manager: SELF EMPLOYED  Social Needs  . Financial resource strain: Not on file  . Food insecurity:    Worry: Not on file    Inability: Not on file  . Transportation needs:    Medical: Not on file    Non-medical: Not on file  Tobacco Use  . Smoking status: Never Smoker  . Smokeless tobacco: Never Used  Substance and Sexual Activity  . Alcohol use: No  . Drug use: No  . Sexual activity: Yes    Birth control/protection: None    Comment: married  Lifestyle  .  Physical activity:    Days per week: Not on file    Minutes per session: Not on file  . Stress: Not on file  Relationships  . Social connections:    Talks on phone: Not on file    Gets together: Not on file    Attends religious service: Not on file    Active member of club or organization: Not on file    Attends meetings of clubs or organizations: Not on file    Relationship status: Not on file  . Intimate partner violence:    Fear of current or ex partner: Not on file    Emotionally abused: Not on file    Physically abused: Not on file    Forced sexual activity: Not on file  Other Topics Concern  . Not on file  Social History Narrative  . Not on file    FAMILY HISTORY:  Family History  Problem Relation Age of Onset  . Cancer Brother        throat  . Cancer Brother        prostate  . Colon cancer Neg Hx   . Liver disease Neg Hx   . Inflammatory bowel disease Neg Hx     CURRENT MEDICATIONS:  Outpatient Encounter Medications as of 06/01/2018  Medication Sig  . acetaminophen (TYLENOL) 500 MG tablet Take 1,000 mg by mouth every 6 (six) hours as needed.  . capecitabine (XELODA) 500 MG tablet TAKE 3 TABLETS BY MOUTH  TWICE DAILY FOR 14 DAYS IN A ROW FOLLOWED BY A 7 DAY REST PERIOD. TAKE AFTER MEAL  . clonazePAM (KLONOPIN) 0.5 MG tablet Take 1 tablet (0.5 mg total) by mouth 2 (two) times daily as needed.  . Diphenhyd-Hydrocort-Nystatin (FIRST-DUKES MOUTHWASH) SUSP Use as directed 5 mLs in the mouth or throat 4 (four) times daily.  Marland Kitchen gabapentin (NEURONTIN) 300 MG capsule Take 3 capsules (900 mg total) by mouth 2 (two) times daily.  Marland Kitchen glimepiride (AMARYL) 2 MG tablet Take 2 mg by mouth daily with breakfast.   . leucovorin in dextrose 5 % 250 mL Inject into the vein once.  Marland Kitchen lisinopril (PRINIVIL,ZESTRIL) 10 MG tablet   . magnesium oxide (MAG-OX) 400 (241.3 Mg) MG tablet Take 1 tablet (400 mg total) by mouth 2 (two) times daily.  . mirtazapine (REMERON) 7.5 MG tablet Take 1 tablet (7.5 mg total) by mouth at bedtime.  Marland Kitchen omeprazole (PRILOSEC) 20 MG capsule Take 20 mg by mouth 2 (two) times daily before a meal.   . ondansetron (ZOFRAN) 8 MG tablet Take 1 tablet (8 mg total) by mouth 2 (two) times daily as needed for refractory nausea / vomiting. Start on day 3 after chemotherapy.  . prochlorperazine (COMPAZINE) 10 MG tablet Take 1 tablet (10 mg total) by mouth every 6 (six) hours as needed (Nausea or vomiting).  . psyllium (REGULOID) 0.52 g capsule Take 0.52 g by mouth daily.  . rivaroxaban (XARELTO) 20 MG TABS tablet Take 1 tablet (20 mg total) by mouth daily with supper.  . tamsulosin (FLOMAX) 0.4 MG CAPS capsule Take 0.4 mg by mouth at bedtime.   . traZODone (DESYREL) 100 MG tablet Take 100 mg by mouth at bedtime.  . [DISCONTINUED] amoxicillin (AMOXIL) 500 MG capsule Take 500 mg by mouth 3 (three) times daily.  . [DISCONTINUED] magnesium oxide (MAG-OX) 400 (241.3 Mg) MG tablet Take 1 tablet (400 mg total) by mouth 2 (two) times daily. (Patient not taking: Reported on 06/01/2018)  . [DISCONTINUED] mirtazapine (REMERON)  7.5 MG tablet Take 7.5 mg by mouth at bedtime.   Facility-Administered Encounter Medications as of  06/01/2018  Medication  . sodium chloride 0.9 % injection 10 mL  . sodium chloride flush (NS) 0.9 % injection 10 mL    ALLERGIES:  Allergies  Allergen Reactions  . Oxycodone     Blisters, hallucinations  . Tramadol     Blisters, hallucinations  . Trazodone And Nefazodone Other (See Comments)    hallucinations     PHYSICAL EXAM:  ECOG Performance status: 1  Vitals:   06/01/18 1050  BP: (!) 150/88  Pulse: 88  Resp: 16  Temp: 97.6 F (36.4 C)  SpO2: 100%   Filed Weights   06/01/18 1043 06/01/18 1050  Weight: 203 lb 4.8 oz (92.2 kg) 203 lb 1.6 oz (92.1 kg)    Physical Exam Constitutional:      Appearance: Normal appearance. He is normal weight.  Cardiovascular:     Rate and Rhythm: Normal rate and regular rhythm.     Heart sounds: Normal heart sounds.  Pulmonary:     Effort: Pulmonary effort is normal.     Breath sounds: Normal breath sounds.  Musculoskeletal: Normal range of motion.  Skin:    General: Skin is warm and dry.  Neurological:     Mental Status: He is alert and oriented to person, place, and time. Mental status is at baseline.  Psychiatric:        Mood and Affect: Mood normal.        Behavior: Behavior normal.        Thought Content: Thought content normal.        Judgment: Judgment normal.      LABORATORY DATA:  I have reviewed the labs as listed.  CBC    Component Value Date/Time   WBC 6.8 06/01/2018 0955   RBC 3.98 (L) 06/01/2018 0955   HGB 13.4 06/01/2018 0955   HGB 11.6 (L) 02/04/2011 1059   HCT 41.5 06/01/2018 0955   HCT 34.7 (L) 02/04/2011 1059   PLT 217 06/01/2018 0955   PLT 282 02/04/2011 1059   MCV 104.3 (H) 06/01/2018 0955   MCV 76.0 (L) 02/04/2011 1059   MCH 33.7 06/01/2018 0955   MCHC 32.3 06/01/2018 0955   RDW 16.4 (H) 06/01/2018 0955   RDW 18.5 (H) 02/04/2011 1059   LYMPHSABS 1.8 06/01/2018 0955   LYMPHSABS 1.1 02/04/2011 1059   MONOABS 0.5 06/01/2018 0955   MONOABS 0.5 02/04/2011 1059   EOSABS 0.2 06/01/2018 0955     EOSABS 0.1 02/04/2011 1059   BASOSABS 0.0 06/01/2018 0955   BASOSABS 0.1 02/04/2011 1059   CMP Latest Ref Rng & Units 06/01/2018 05/11/2018 04/20/2018  Glucose 70 - 99 mg/dL 284(H) 171(H) 197(H)  BUN 8 - 23 mg/dL '16 11 13  '$ Creatinine 0.61 - 1.24 mg/dL 0.81 0.82 0.86  Sodium 135 - 145 mmol/L 136 140 137  Potassium 3.5 - 5.1 mmol/L 4.5 3.8 3.8  Chloride 98 - 111 mmol/L 106 111 108  CO2 22 - 32 mmol/L '23 23 23  '$ Calcium 8.9 - 10.3 mg/dL 8.7(L) 8.6(L) 8.8(L)  Total Protein 6.5 - 8.1 g/dL 6.3(L) 6.1(L) 6.5  Total Bilirubin 0.3 - 1.2 mg/dL 1.3(H) 1.5(H) 1.6(H)  Alkaline Phos 38 - 126 U/L 81 92 74  AST 15 - 41 U/L '21 30 24  '$ ALT 0 - 44 U/L '19 31 28       '$ DIAGNOSTIC IMAGING:  I have independently reviewed the scans and discussed with  the patient.   I have reviewed Francene Finders, NP's note and agree with the documentation.  I personally performed a face-to-face visit, made revisions and my assessment and plan is as follows.    ASSESSMENT & PLAN:   Rectal cancer 1.  Stage IV rectal cancer with lung and pancreatic metastasis: -He was on maintenance 5-FU and bevacizumab.  He started Xeloda first cycle on 08/07/2017 and took 4 pills twice a day.  He is on a 2 weeks on 1 week of regimen.  He felt very tired after starting Xeloda.  He also had flulike symptoms.  His appetite has gone down. - We have cut back on Xeloda dose to 1500 mg twice a day during cycle 2.  - CT scan dated 02/10/2018 reviewed by me shows stable disease. - Last CEA was 5.2 on 01/26/2018.  CEA level from today is pending. - He is tolerating bevacizumab and Xeloda very well. -We reviewed the results of the CT CAP dated 05/28/2018 which shows slight increase in the right upper lobe nodule measuring 11 x 6 mm, compared with 8 x 6 mm.  No new pulmonary nodules.  No evidence of metastatic disease in the liver or abdomen.  No mediastinal adenopathy. - As there was very minimal growth in the lung nodules, I did not recommend any  changes at this time.  We will continue bevacizumab and Xeloda. -I have recommended repeating CT scan in 2 months.  We will follow-up on CEA level from today. - He complained of loss of appetite in the last 2 weeks.  We will start him back on mirtazapine 7.5 mg daily.  2.  Neuropathy: He takes gabapentin 900 mg twice daily for numbness in the hands and feet.  This is well controlled.   3.  DVT and PE: -Diagnosed in 2016 and IVC filter was placed. -She will continue Xarelto 20 mg daily.  4.  Rectal pain: -He takes hydrocodone 5 mg once or twice a week as needed.  5.  Hypomagnesemia: Magnesium is 1.7 today.  He will continue magnesium daily.      Orders placed this encounter:  Orders Placed This Encounter  Procedures  . CT Chest W Contrast  . CT Abdomen Pelvis W Contrast  . Magnesium  . CBC with Differential/Platelet  . Comprehensive metabolic panel  . CEA      Derek Jack, MD [place] [phone]

## 2018-06-01 NOTE — Progress Notes (Signed)
He is ready for treatment. Labs reviewed.

## 2018-06-01 NOTE — Assessment & Plan Note (Addendum)
1.  Stage IV rectal cancer with lung and pancreatic metastasis: -He was on maintenance 5-FU and bevacizumab.  He started Xeloda first cycle on 08/07/2017 and took 4 pills twice a day.  He is on a 2 weeks on 1 week of regimen.  He felt very tired after starting Xeloda.  He also had flulike symptoms.  His appetite has gone down. - We have cut back on Xeloda dose to 1500 mg twice a day during cycle 2.  - CT scan dated 02/10/2018 reviewed by me shows stable disease. - Last CEA was 5.2 on 01/26/2018.  CEA level from today is pending. - He is tolerating bevacizumab and Xeloda very well. -We reviewed the results of the CT CAP dated 05/28/2018 which shows slight increase in the right upper lobe nodule measuring 11 x 6 mm, compared with 8 x 6 mm.  No new pulmonary nodules.  No evidence of metastatic disease in the liver or abdomen.  No mediastinal adenopathy. - As there was very minimal growth in the lung nodules, I did not recommend any changes at this time.  We will continue bevacizumab and Xeloda. -I have recommended repeating CT scan in 2 months.  We will follow-up on CEA level from today. - He complained of loss of appetite in the last 2 weeks.  We will start him back on mirtazapine 7.5 mg daily.  2.  Neuropathy: He takes gabapentin 900 mg twice daily for numbness in the hands and feet.  This is well controlled.   3.  DVT and PE: -Diagnosed in 2016 and IVC filter was placed. -She will continue Xarelto 20 mg daily.  4.  Rectal pain: -He takes hydrocodone 5 mg once or twice a week as needed.  5.  Hypomagnesemia: Magnesium is 1.7 today.  He will continue magnesium daily.

## 2018-06-01 NOTE — Patient Instructions (Signed)
[facility] Discharge Instructions     Thank you for choosing [facility] at [facility] to provide your oncology and hematology care.  To afford each patient quality time with our provider, please arrive at least 15 minutes before your scheduled appointment time.   If you have a lab appointment with the Cancer Center please come in thru the  Main Entrance and check in at the main information desk  You need to re-schedule your appointment should you arrive 10 or more minutes late.  We strive to give you quality time with our providers, and arriving late affects you and other patients whose appointments are after yours.  Also, if you no show three or more times for appointments you may be dismissed from the clinic at the providers discretion.     Again, thank you for choosing [facility].  Our hope is that these requests will decrease the amount of time that you wait before being seen by our physicians.       _____________________________________________________________  Should you have questions after your visit to [facility], please contact our office at [phone] between the hours of 8:00 a.m. and 4:30 p.m.  Voicemails left after 4:00 p.m. will not be returned until the following business day.  For prescription refill requests, have your pharmacy contact our office and allow 72 hours.    Cancer Center Support Programs:   > Cancer Support Group  2nd Tuesday of the month 1pm-2pm, Journey Room    

## 2018-06-01 NOTE — Progress Notes (Signed)
Labs reviewed by MD today at office visit. No new issues at this time. Proceed with treatment today.    Treatment given per orders. Patient tolerated it well without problems. Vitals stable and discharged home from clinic ambulatory. Follow up as scheduled.

## 2018-06-02 LAB — CEA: CEA: 10.9 ng/mL — ABNORMAL HIGH (ref 0.0–4.7)

## 2018-06-03 ENCOUNTER — Telehealth (HOSPITAL_COMMUNITY): Payer: Self-pay | Admitting: Emergency Medicine

## 2018-06-03 NOTE — Telephone Encounter (Signed)
VM left for patient wife to let her know Remeron rx was sent in to [facility] on 1/28 and a receipt was confirmed by that pharmacy. I told her to check with pharmacy again and to call back if they have no received it yet.

## 2018-06-15 MED FILL — XELODA 500 MG TABLET: 500 | 21 days supply | Qty: 84 | Fill #1

## 2018-06-22 ENCOUNTER — Encounter (HOSPITAL_COMMUNITY): Payer: Self-pay

## 2018-06-22 ENCOUNTER — Inpatient Hospital Stay (HOSPITAL_COMMUNITY): Payer: Medicare Other | Attending: Hematology

## 2018-06-22 ENCOUNTER — Inpatient Hospital Stay (HOSPITAL_COMMUNITY): Payer: Medicare Other

## 2018-06-22 ENCOUNTER — Inpatient Hospital Stay (HOSPITAL_COMMUNITY): Payer: Medicare Other | Admitting: Dietician

## 2018-06-22 VITALS — BP 142/85 | HR 88 | Temp 98.6°F | Resp 16 | Wt 203.0 lb

## 2018-06-22 DIAGNOSIS — Z923 Personal history of irradiation: Secondary | ICD-10-CM | POA: Insufficient documentation

## 2018-06-22 DIAGNOSIS — Z79899 Other long term (current) drug therapy: Secondary | ICD-10-CM | POA: Diagnosis not present

## 2018-06-22 DIAGNOSIS — Z5112 Encounter for antineoplastic immunotherapy: Secondary | ICD-10-CM | POA: Diagnosis not present

## 2018-06-22 DIAGNOSIS — G893 Neoplasm related pain (acute) (chronic): Secondary | ICD-10-CM | POA: Insufficient documentation

## 2018-06-22 DIAGNOSIS — Z9221 Personal history of antineoplastic chemotherapy: Secondary | ICD-10-CM | POA: Insufficient documentation

## 2018-06-22 DIAGNOSIS — C20 Malignant neoplasm of rectum: Secondary | ICD-10-CM | POA: Diagnosis not present

## 2018-06-22 DIAGNOSIS — R195 Other fecal abnormalities: Secondary | ICD-10-CM

## 2018-06-22 DIAGNOSIS — C7800 Secondary malignant neoplasm of unspecified lung: Principal | ICD-10-CM

## 2018-06-22 DIAGNOSIS — Z86718 Personal history of other venous thrombosis and embolism: Secondary | ICD-10-CM | POA: Diagnosis not present

## 2018-06-22 LAB — URINALYSIS, DIPSTICK ONLY
Bilirubin Urine: NEGATIVE
Glucose, UA: >=500 mg/dL — AB
Hgb urine dipstick: NEGATIVE
Ketones, ur: NEGATIVE mg/dL
Leukocytes,Ua: NEGATIVE
Nitrite: NEGATIVE
Protein, ur: 30 mg/dL — AB
Specific Gravity, Urine: 1.023 (ref 1.005–1.030)
pH: 5 (ref 5.0–8.0)

## 2018-06-22 LAB — COMPREHENSIVE METABOLIC PANEL
ALT: 32 U/L (ref 0–44)
AST: 24 U/L (ref 15–41)
Albumin: 3.5 g/dL (ref 3.5–5.0)
Alkaline Phosphatase: 84 U/L (ref 38–126)
Anion gap: 7 (ref 5–15)
BUN: 12 mg/dL (ref 8–23)
CO2: 23 mmol/L (ref 22–32)
Calcium: 8.5 mg/dL — ABNORMAL LOW (ref 8.9–10.3)
Chloride: 108 mmol/L (ref 98–111)
Creatinine, Ser: 0.87 mg/dL (ref 0.61–1.24)
GFR calc Af Amer: >60 mL/min (ref >60)
GFR calc non Af Amer: >60 mL/min (ref >60)
Glucose, Bld: 347 mg/dL — ABNORMAL HIGH (ref 70–99)
Potassium: 4.4 mmol/L (ref 3.5–5.1)
Sodium: 138 mmol/L (ref 135–145)
Total Bilirubin: 2 mg/dL — ABNORMAL HIGH (ref 0.3–1.2)
Total Protein: 6 g/dL — ABNORMAL LOW (ref 6.5–8.1)

## 2018-06-22 LAB — CBC WITH DIFFERENTIAL/PLATELET
Abs Immature Granulocytes: 0.01 10*3/uL (ref 0.00–0.07)
Basophils Absolute: 0 10*3/uL (ref 0.0–0.1)
Basophils Relative: 0 %
Eosinophils Absolute: 0.1 10*3/uL (ref 0.0–0.5)
Eosinophils Relative: 2 %
HCT: 40.9 % (ref 39.0–52.0)
Hemoglobin: 13.2 g/dL (ref 13.0–17.0)
Immature Granulocytes: 0 %
Lymphocytes Relative: 25 %
Lymphs Abs: 1.5 10*3/uL (ref 0.7–4.0)
MCH: 33.7 pg (ref 26.0–34.0)
MCHC: 32.3 g/dL (ref 30.0–36.0)
MCV: 104.3 fL — ABNORMAL HIGH (ref 80.0–100.0)
Monocytes Absolute: 0.4 10*3/uL (ref 0.1–1.0)
Monocytes Relative: 7 %
Neutro Abs: 3.9 10*3/uL (ref 1.7–7.7)
Neutrophils Relative %: 66 %
Platelets: 196 10*3/uL (ref 150–400)
RBC: 3.92 MIL/uL — ABNORMAL LOW (ref 4.22–5.81)
RDW: 16.7 % — ABNORMAL HIGH (ref 11.5–15.5)
WBC: 6 10*3/uL (ref 4.0–10.5)
nRBC: 0 % (ref 0.0–0.2)

## 2018-06-22 LAB — MAGNESIUM: Magnesium: 1.7 mg/dL (ref 1.7–2.4)

## 2018-06-22 MED ORDER — MIRTAZAPINE 7.5 MG PO TABS: 7.5000 mg | ORAL_TABLET | Freq: Every day | ORAL | 1 refills | Status: DC

## 2018-06-22 MED ORDER — HEPARIN SOD (PORK) LOCK FLUSH 100 UNIT/ML IV SOLN
500.0000 [IU] | Freq: Once | INTRAVENOUS | Status: AC | PRN
  Administered 2018-06-22: 500 [IU]

## 2018-06-22 MED ORDER — SODIUM CHLORIDE 0.9 % IV SOLN
Freq: Once | INTRAVENOUS | Status: AC
  Administered 2018-06-22: 09:00:00 via INTRAVENOUS

## 2018-06-22 MED ORDER — HYDROCODONE-ACETAMINOPHEN 5-325 MG PO TABS: 1.0000 | ORAL_TABLET | Freq: Four times a day (QID) | ORAL | 0 refills | Status: DC | PRN

## 2018-06-22 MED ORDER — SODIUM CHLORIDE 0.9 % IV SOLN
700.0000 mg | Freq: Once | INTRAVENOUS | Status: AC
  Administered 2018-06-22: 700 mg via INTRAVENOUS
  Filled 2018-06-22: qty 12

## 2018-06-22 MED ORDER — PANCRELIPASE (LIP-PROT-AMYL) 36000-114000 UNITS PO CPEP: 72000.0000 [IU] | ORAL_CAPSULE | Freq: Three times a day (TID) | ORAL | 0 refills | Status: DC

## 2018-06-22 MED ORDER — SODIUM CHLORIDE 0.9% FLUSH
10.0000 mL | INTRAVENOUS | Status: DC | PRN
  Administered 2018-06-22: 10 mL
  Filled 2018-06-22: qty 10

## 2018-06-22 NOTE — Patient Instructions (Signed)
[facility] Discharge Instructions for Patients Receiving Chemotherapy  Today you received the following chemotherapy agents   To help prevent nausea and vomiting after your treatment, we encourage you to take your nausea medication   If you develop nausea and vomiting that is not controlled by your nausea medication, call the clinic.   BELOW ARE SYMPTOMS THAT SHOULD BE REPORTED IMMEDIATELY:  *FEVER GREATER THAN 100.5 F  *CHILLS WITH OR WITHOUT FEVER  NAUSEA AND VOMITING THAT IS NOT CONTROLLED WITH YOUR NAUSEA MEDICATION  *UNUSUAL SHORTNESS OF BREATH  *UNUSUAL BRUISING OR BLEEDING  TENDERNESS IN MOUTH AND THROAT WITH OR WITHOUT PRESENCE OF ULCERS  *URINARY PROBLEMS  *BOWEL PROBLEMS  UNUSUAL RASH Items with * indicate a potential emergency and should be followed up as soon as possible.  Feel free to call the clinic should you have any questions or concerns. The clinic phone number is [phone].  Please show the CHEMO ALERT CARD at check-in to the Emergency Department and triage nurse.   

## 2018-06-22 NOTE — Progress Notes (Signed)
Nutrition Follow-up 68 y/o male PMHx HTN, GERD, DM, Depression, PVD and Stage IV rectal cancer with metastases to lungs & pancreas. Currently on maintenance chemotherapy.   RD asked to see patient d/t poor appetite since mid January.    Pt seen with spouse today. He reports a generalized lack of appetite- "I got no taste for anything". He reports the items he likes one week will not be the same the following week. He says this became worse about 4 weeks ago. Pt was restarted on remeron ~3 weeks ago. Spouse/pt report that pt had been on this before and it HAD help, however, he has not perceived any benefit this go around. He says he had received an alternate medication in the past that helped a lot. He does not remember the name, but says it was "a white pill and you had to refrigerate it".   Took diet recall: He usually eats 2 meals and 2 snacks a day.  Breakfast: Eggs, Toast, Coffee +/- Bacon, biscuit Lunch: Skips. May have light snack (PB crackers vs Ensure) Dinner: Full course meal - specific items vary HS snack: 1-2 glasses of milk and maybe a cookie or Ensure  He drinks oral supplements, approximately 1-2 a day. He takes Vitamin b12.   Asking about any other issues/dietary obstacles, he denies any n/v/c. He reports chronic diarrhea. He says he has 10-12 stools a day. He takes imodium 2x a day but it doesn't seem to help him. He says the stools are not watery, but somewhat formed. He reports large amounts of stool - he feels "more comes out then goes in". Stools apparently begin as soon as he starts eating. "Every time I eat, I have to go to the bathroom".  He says he averages 3-4 BMs every time he eats. He says loose stools persist between meals as well. He reports stools float in toilet.    Despite a reportedly poor appetite, pts weight has been stable between 200-210 lbs for the past 2 years   Wt Readings from Last 10 Encounters:  06/22/18 203 lb (92.1 kg)  06/01/18 203 lb 1.6 oz (92.1  kg)  05/11/18 210 lb (95.3 kg)  04/20/18 209 lb 3.2 oz (94.9 kg)  03/30/18 204 lb 8 oz (92.8 kg)  03/09/18 210 lb (95.3 kg)  02/16/18 208 lb 12.4 oz (94.7 kg)  01/26/18 211 lb 3.2 oz (95.8 kg)  01/05/18 209 lb 6.4 oz (95 kg)  12/15/17 211 lb 9.6 oz (96 kg)   MEDICATIONS:  Chemo: Avastin, Xeloda Supportive medications: Remeron 7.5 mg, Klonopin, Dukes Mouth Wash, ppi, compazine, zofran Other: Neurontin, Glimepiride, mag ox, psyllium fiber, xarelto, lisinopril  LABS: BG: 347, otherwise unremarkable. He says he had his A1c checked at his PCP several months back and it was 6.2  Recent Labs  Lab 06/22/18 0807  NA 138  K 4.4  CL 108  CO2 23  BUN 12  CREATININE 0.87  CALCIUM 8.5*  MG 1.7  GLUCOSE 347*    ANTHROPOMETRICS: Height:  Ht Readings from Last 1 Encounters:  09/23/17 6\' 2"  (1.88 m)   Weight:  Wt Readings from Last 1 Encounters:  06/22/18 203 lb (92.1 kg)   BMI:  BMI Readings from Last 1 Encounters:  06/22/18 26.06 kg/m   UBW: 200-210 lbs (per chart) IBW: 190 lbs (86.36 kg)  Wt changes: stable between 200-210 x 2 years  ESTIMATED ENERGY NEEDS:  Kcal: 2100-2300 kcals (23-25 kcal/kg bw) Protein: 105-120g Pro (1.2-1.4g/kg ibw)  Fluid:  2.1-2.3 L fluid (1 ml.kcal bw)  NUTRITION DIAGNOSIS:  Increased protein/kcal needs r/t cancer and cancer related treatments AEB estimated nutritional requirements for this condition  DOCUMENTATION CODES:  Not Applicable  INTERVENTION:  Pt has had reported bowel trouble for years now. However, the way he presents it today makes it sound like it is associated with his meals. Upon further questioning, it sounds his loose stools begin as soon as he starts eating. Pt has a hx of pancreatic metastases and his last CT of abdomen noting he has "atrophy of the entire gland". He says yes when asked if his stools float. Question if some of his chronic diarrhea is d/t malabsorption. Conversed w/ MD/PA regarding this. The chemotherapy drugs  he receives do have a common side effect of diarrhea, but MD felt it would be reasonable to try it based on pts report of diarrhea worsening with meals. To order 72k units w/ meals (2 pills) and 36K with snacks (1 pill). RD reviewed with pt/spouse how to take it.   Regarding his poor appetite, he sounds to still be eating fairly well (2 meals, 2 snacks, drinking 1-2 supplements). He also is making good meal choices (eating protein with meals and drinking kcal containing bevs) He does not display any significant wt loss. He was restarted on remeron. Not much to add in regards to his appetite.  RD offered pt a case of Ensure. He declined because he does not like vanilla and he didn't want to try adding choc/straw syrup.   Will see if better controlled bowels leads to better intake. If he is malabsorbing, he will better absorb the food he eats and may gain weight.    GOAL:  Oral intake to meet >90% of needs, wt stability   MONITOR:  Appetite, weight, labs, chemotherapy tolerance  Next Visit:  3/10- during infusion  Burtis Junes RD, LDN, CNSC Clinical Nutrition Available Tues-Sat via Pager: [phone] 06/22/2018 8:55 AM

## 2018-06-22 NOTE — Progress Notes (Signed)
Patient presented today for avastin treatment. Patient is taking xeloda and has not missed any doses and reports no side effects at this time. Only complaint is mid sternum chest pain, that radiates to the back , sometimes hurts around his right ribs, side and then to his back. Patient is taking some type of pain medication but unaware of name, this is not working. Has a hard time getting comfortable enough to go to sleep at times. Will also refill remeron per patient request.   Dr. [name] reviewed labs, will order Hydrocodone for pain and instructed patient to try taking one pill at night with sleeping pill. Encouraged patient to take one during the day as this pain seems to be intermittant. Will call and check on patient in the next two days to assess whether this is helping or not.   Treatment given per orders. Patient tolerated it well without problems. Vitals stable and discharged home from clinic ambulatory. Follow up as scheduled.

## 2018-06-22 NOTE — Addendum Note (Signed)
Addended by: Glennie Isle on: 06/22/2018 11:56 AM   Modules accepted: Orders

## 2018-07-01 ENCOUNTER — Other Ambulatory Visit (HOSPITAL_COMMUNITY): Payer: Self-pay | Admitting: Hematology

## 2018-07-01 DIAGNOSIS — C20 Malignant neoplasm of rectum: Secondary | ICD-10-CM

## 2018-07-01 DIAGNOSIS — C7800 Secondary malignant neoplasm of unspecified lung: Secondary | ICD-10-CM

## 2018-07-06 DIAGNOSIS — C189 Malignant neoplasm of colon, unspecified: Secondary | ICD-10-CM | POA: Diagnosis not present

## 2018-07-06 DIAGNOSIS — C20 Malignant neoplasm of rectum: Secondary | ICD-10-CM | POA: Diagnosis not present

## 2018-07-06 DIAGNOSIS — Z6824 Body mass index (BMI) 24.0-24.9, adult: Secondary | ICD-10-CM | POA: Diagnosis not present

## 2018-07-06 DIAGNOSIS — K589 Irritable bowel syndrome without diarrhea: Secondary | ICD-10-CM | POA: Diagnosis not present

## 2018-07-06 DIAGNOSIS — I10 Essential (primary) hypertension: Secondary | ICD-10-CM | POA: Diagnosis not present

## 2018-07-06 DIAGNOSIS — Z299 Encounter for prophylactic measures, unspecified: Secondary | ICD-10-CM | POA: Diagnosis not present

## 2018-07-06 DIAGNOSIS — C799 Secondary malignant neoplasm of unspecified site: Secondary | ICD-10-CM | POA: Diagnosis not present

## 2018-07-08 ENCOUNTER — Other Ambulatory Visit (HOSPITAL_COMMUNITY): Payer: Self-pay | Admitting: Nurse Practitioner

## 2018-07-08 DIAGNOSIS — C20 Malignant neoplasm of rectum: Secondary | ICD-10-CM

## 2018-07-08 DIAGNOSIS — C7800 Secondary malignant neoplasm of unspecified lung: Secondary | ICD-10-CM

## 2018-07-08 MED ORDER — CAPECITABINE 500 MG PO TABS: ORAL_TABLET | ORAL | 1 refills | Status: DC

## 2018-07-08 MED FILL — XELODA 500 MG TABLET: 500 | 21 days supply | Qty: 84 | Fill #0

## 2018-07-09 ENCOUNTER — Other Ambulatory Visit (HOSPITAL_COMMUNITY): Payer: Self-pay | Admitting: *Deleted

## 2018-07-12 ENCOUNTER — Other Ambulatory Visit (HOSPITAL_COMMUNITY): Payer: Self-pay

## 2018-07-12 ENCOUNTER — Other Ambulatory Visit (HOSPITAL_COMMUNITY): Payer: Self-pay | Admitting: Hematology

## 2018-07-12 DIAGNOSIS — C20 Malignant neoplasm of rectum: Secondary | ICD-10-CM

## 2018-07-12 DIAGNOSIS — C7800 Secondary malignant neoplasm of unspecified lung: Secondary | ICD-10-CM

## 2018-07-12 NOTE — Progress Notes (Signed)
urio

## 2018-07-13 ENCOUNTER — Encounter (HOSPITAL_COMMUNITY): Payer: Medicare Other | Admitting: Dietician

## 2018-07-13 ENCOUNTER — Other Ambulatory Visit (HOSPITAL_COMMUNITY): Payer: Medicare Other

## 2018-07-13 ENCOUNTER — Inpatient Hospital Stay (HOSPITAL_COMMUNITY): Payer: Medicare Other

## 2018-07-13 ENCOUNTER — Inpatient Hospital Stay (HOSPITAL_COMMUNITY): Payer: Medicare Other | Attending: Hematology

## 2018-07-13 ENCOUNTER — Inpatient Hospital Stay (HOSPITAL_COMMUNITY): Payer: Medicare Other | Admitting: Dietician

## 2018-07-13 VITALS — BP 128/83 | HR 92 | Temp 98.5°F | Resp 18 | Wt 197.3 lb

## 2018-07-13 DIAGNOSIS — G893 Neoplasm related pain (acute) (chronic): Secondary | ICD-10-CM | POA: Insufficient documentation

## 2018-07-13 DIAGNOSIS — Z5112 Encounter for antineoplastic immunotherapy: Secondary | ICD-10-CM | POA: Insufficient documentation

## 2018-07-13 DIAGNOSIS — R195 Other fecal abnormalities: Secondary | ICD-10-CM

## 2018-07-13 DIAGNOSIS — C20 Malignant neoplasm of rectum: Secondary | ICD-10-CM

## 2018-07-13 DIAGNOSIS — Z79899 Other long term (current) drug therapy: Secondary | ICD-10-CM | POA: Insufficient documentation

## 2018-07-13 DIAGNOSIS — Z923 Personal history of irradiation: Secondary | ICD-10-CM | POA: Insufficient documentation

## 2018-07-13 DIAGNOSIS — Z86718 Personal history of other venous thrombosis and embolism: Secondary | ICD-10-CM | POA: Insufficient documentation

## 2018-07-13 DIAGNOSIS — C7800 Secondary malignant neoplasm of unspecified lung: Secondary | ICD-10-CM

## 2018-07-13 DIAGNOSIS — Z9221 Personal history of antineoplastic chemotherapy: Secondary | ICD-10-CM | POA: Insufficient documentation

## 2018-07-13 LAB — COMPREHENSIVE METABOLIC PANEL
ALT: 26 U/L (ref 0–44)
AST: 24 U/L (ref 15–41)
Albumin: 3.4 g/dL — ABNORMAL LOW (ref 3.5–5.0)
Alkaline Phosphatase: 78 U/L (ref 38–126)
Anion gap: 8 (ref 5–15)
BUN: 13 mg/dL (ref 8–23)
CO2: 21 mmol/L — ABNORMAL LOW (ref 22–32)
Calcium: 8.2 mg/dL — ABNORMAL LOW (ref 8.9–10.3)
Chloride: 109 mmol/L (ref 98–111)
Creatinine, Ser: 0.72 mg/dL (ref 0.61–1.24)
GFR calc Af Amer: >60 mL/min (ref >60)
GFR calc non Af Amer: >60 mL/min (ref >60)
Glucose, Bld: 284 mg/dL — ABNORMAL HIGH (ref 70–99)
Potassium: 3.5 mmol/L (ref 3.5–5.1)
Sodium: 138 mmol/L (ref 135–145)
Total Bilirubin: 1.8 mg/dL — ABNORMAL HIGH (ref 0.3–1.2)
Total Protein: 5.8 g/dL — ABNORMAL LOW (ref 6.5–8.1)

## 2018-07-13 LAB — URINALYSIS, DIPSTICK ONLY
Bilirubin Urine: NEGATIVE
Glucose, UA: 150 mg/dL — AB
Hgb urine dipstick: NEGATIVE
Ketones, ur: NEGATIVE mg/dL
Leukocytes,Ua: NEGATIVE
Nitrite: NEGATIVE
Protein, ur: 100 mg/dL — AB
Specific Gravity, Urine: 1.026 (ref 1.005–1.030)
pH: 5 (ref 5.0–8.0)

## 2018-07-13 LAB — CBC WITH DIFFERENTIAL/PLATELET
Abs Immature Granulocytes: 0.02 10*3/uL (ref 0.00–0.07)
Basophils Absolute: 0 10*3/uL (ref 0.0–0.1)
Basophils Relative: 0 %
Eosinophils Absolute: 0.1 10*3/uL (ref 0.0–0.5)
Eosinophils Relative: 2 %
HCT: 38.6 % — ABNORMAL LOW (ref 39.0–52.0)
Hemoglobin: 13.1 g/dL (ref 13.0–17.0)
Immature Granulocytes: 0 %
Lymphocytes Relative: 28 %
Lymphs Abs: 1.7 10*3/uL (ref 0.7–4.0)
MCH: 35.1 pg — ABNORMAL HIGH (ref 26.0–34.0)
MCHC: 33.9 g/dL (ref 30.0–36.0)
MCV: 103.5 fL — ABNORMAL HIGH (ref 80.0–100.0)
Monocytes Absolute: 0.5 10*3/uL (ref 0.1–1.0)
Monocytes Relative: 8 %
Neutro Abs: 3.6 10*3/uL (ref 1.7–7.7)
Neutrophils Relative %: 62 %
Platelets: 191 10*3/uL (ref 150–400)
RBC: 3.73 MIL/uL — ABNORMAL LOW (ref 4.22–5.81)
RDW: 16.1 % — ABNORMAL HIGH (ref 11.5–15.5)
WBC: 5.9 10*3/uL (ref 4.0–10.5)
nRBC: 0 % (ref 0.0–0.2)

## 2018-07-13 LAB — MAGNESIUM: Magnesium: 1.8 mg/dL (ref 1.7–2.4)

## 2018-07-13 MED ORDER — HEPARIN SOD (PORK) LOCK FLUSH 100 UNIT/ML IV SOLN
500.0000 [IU] | Freq: Once | INTRAVENOUS | Status: AC
  Administered 2018-07-13: 500 [IU] via INTRAVENOUS

## 2018-07-13 MED ORDER — SODIUM CHLORIDE 0.9% FLUSH
10.0000 mL | Freq: Once | INTRAVENOUS | Status: AC
  Administered 2018-07-13: 10 mL via INTRAVENOUS

## 2018-07-13 MED ORDER — DIPHENOXYLATE-ATROPINE 2.5-0.025 MG PO TABS: 1.0000 | ORAL_TABLET | Freq: Four times a day (QID) | ORAL | 0 refills | Status: DC | PRN

## 2018-07-13 NOTE — Progress Notes (Signed)
Patient for treatment today and dietitian visit.  Patient stated he started creon 3 weeks ago and started having bad abdominal cramping so he stopped it after 4 or 5 days and the cramps went away. He is also having diarrhea every 15 minutes while on the creon and after he stopped the medication.   Been using imodium and metamucil.  Reviewed with Dr. [name] with verbal order let the dietitian review the creon, stop the metamucil, and a script was sent to [facility] in [place] by the nurse practitioner.  All medications reviewed with the patient and wife.

## 2018-07-13 NOTE — Addendum Note (Signed)
Addended by: Charlyne Petrin B on: 07/13/2018 12:08 PM   Modules accepted: Orders

## 2018-07-13 NOTE — Progress Notes (Signed)
Nutrition Follow-up 68 y/o male PMHx HTN, GERD, DM, Depression, PVD and Stage IV rectal cancer with metastases to lungs & pancreas. Currently on maintenance chemotherapy.   Following up with patient regarding recent initiation of creon in setting up potential pancreatic insufficiency. He had been asked to take 72000 units with meals and 36000 with snacks  Unfortunately, pt says he tolerated the creon poorly. It caused severe abdominal cramping and did not help his diarrhea in the slightest; they say it possible even made it worse. Because of this, he stopped taking the creon 1.5-2 weeks ago. The diarrhea has persisted though.   RD again asked about  The characteristics of his bowels. He still reports his stools floating, but says "not all of them float...some float, some dont". They still report worsening with meals and they both subjectively report the stools as foul smelling.   He has lost weight since last visit and attributes all of this to cramps and diarrhea. He feels as if food "goes right through me". He reports having atleast 10-12 loose BMs/day. He takes imodium, but this has "zero impact"  He still reports a poor appetite. He only eats 2x a day. His lunch yesterday was Hansel Starling and his breakfast was 2 eggs and toast. He says the Remeron he is taking does not help at all and hasnt helped for some time now. He asks about any other medications that could provide him an appetite.   To help combat his diarrhea, he was ordered lomotil today by MD. He was also asked to stop taking his metamucil, which he says he has been taking since 2012.   Todays weight of 197.4 lbs reflects a loss of ~6 lbs (3% bw) x 2 weeks. Prior to today, his weight had been stable between 200-210 lbs for the past 2 years   Wt Readings from Last 10 Encounters:  07/13/18 197 lb 4.8 oz (89.5 kg)  06/22/18 203 lb (92.1 kg)  06/01/18 203 lb 1.6 oz (92.1 kg)  05/11/18 210 lb (95.3 kg)  04/20/18 209 lb 3.2 oz (94.9 kg)   03/30/18 204 lb 8 oz (92.8 kg)  03/09/18 210 lb (95.3 kg)  02/16/18 208 lb 12.4 oz (94.7 kg)  01/26/18 211 lb 3.2 oz (95.8 kg)  01/05/18 209 lb 6.4 oz (95 kg)   MEDICATIONS:  Chemo: Avastin, Xeloda Supportive medications: Remeron 7.5 mg, Klonopin, hydrocodone, Lomotil**, Dukes Mouth Wash, ppi, compazine, zofran Other: Neurontin, Glimepiride, mag ox, psyllium fiber, xarelto, lisinopril  LABS: BG: 284, Bg the last 3 visits have been 284-347. At last visit, pt stated he had his A1c checked at his PCP several months back and it was 6.2   Recent Labs  Lab 07/13/18 1119  NA 138  K 3.5  CL 109  CO2 21*  BUN 13  CREATININE 0.72  CALCIUM 8.2*  MG 1.8  GLUCOSE 284*    ANTHROPOMETRICS: Height:  Ht Readings from Last 1 Encounters:  09/23/17 '6\' 2"'  (1.88 m)   Weight:  Wt Readings from Last 1 Encounters:  07/13/18 197 lb 4.8 oz (89.5 kg)   BMI:  BMI Readings from Last 1 Encounters:  07/13/18 25.33 kg/m   UBW: 200-210 lbs (per chart) IBW: 190 lbs (86.36 kg) Wt changes: -6 lbs x 2 weeks Prior to today, wt had been stable between 200-210 x 2 years  ESTIMATED ENERGY NEEDS:  Kcal: 2050-2250kcals (23-25 kcal/kg bw) Protein: 105-120g Pro (1.2-1.4g/kg ibw) Fluid: 2.1-2.3 L fluid (1 ml.kcal bw) +enough to replace fluid  losses  NUTRITION DIAGNOSIS:  Increased protein/kcal needs r/t cancer and cancer related treatments AEB estimated nutritional requirements for this condition  DOCUMENTATION CODES:  Not Applicable  INTERVENTION:   Pts uncontrolled diarrhea has led pt to eating less out of fear. This is on top of an already poor appetite and questionable element of malabsorption. Unfortunately, pt reports being negatively, not positively, impacted by the creon. RD again reviewed his bowel movements and they still sound like they are worsened with PO intake and fit the description of steatorrhic stools. Per discussion with MD, it is hard to pinpoint etiology of diarrhea, as both  chemo agents the pt is on have diarrhea as a common side effect, but he also has imaging-confirmed pancreatic atrophy and pancreatic metastasis. MD suspects diarrhea as being multifactorial.   Given pts poor appetite and smaller meal size, question if creon dose is too high relative to PO intake. RD recommended trying to take only 36k units with meals and see if this makes the medication any more tolerable. If he continues to have the cramping and no improvement in his diarrhea, he should stop taking it and go back to just using the soluble fiber supp. Hopefully lomotil will also prove more effective than the imodium.   Regarding his poor appetite, RD did name a couple other potential appetite stimulants, though noted each has side effects and also are not assured to work. RD told pt/spouse if they really felt the pts anorexia was the leading issue, RD could discuss possible addition of alternative stimulant with MD. However, they both felt the diarrhea is the far larger issue. Will see how decrease of creon dose and additional of lomotil impacts the patients bowels.   GOAL:  Oral intake to meet >90% of needs, wt stability   Not met  MONITOR:  Intake, Appetite, weight, labs, chemotherapy tolerance, state of bowels, creon tolerance  Next Visit:  3/17- during infusion  Burtis Junes RD, LDN, CNSC Clinical Nutrition Available Tues-Sat via Pager: [phone] 07/13/2018 1:06 PM

## 2018-07-13 NOTE — Progress Notes (Signed)
Reviewed the patients urine protein with Dr. [name] with verbal order to hold treatment today, return next week and if the urine protein is below 100 ok to treat, and keep CT scan as scheduled.  Note sent to the schedulers, patient notified, and pharmacy notified.  Patient verbalized understanding.

## 2018-07-19 ENCOUNTER — Other Ambulatory Visit (HOSPITAL_COMMUNITY): Payer: Self-pay

## 2018-07-19 DIAGNOSIS — C20 Malignant neoplasm of rectum: Secondary | ICD-10-CM

## 2018-07-19 DIAGNOSIS — C7800 Secondary malignant neoplasm of unspecified lung: Secondary | ICD-10-CM

## 2018-07-20 ENCOUNTER — Other Ambulatory Visit: Payer: Self-pay

## 2018-07-20 ENCOUNTER — Inpatient Hospital Stay (HOSPITAL_COMMUNITY): Payer: Medicare Other

## 2018-07-20 ENCOUNTER — Encounter (HOSPITAL_COMMUNITY): Payer: Self-pay

## 2018-07-20 ENCOUNTER — Encounter (HOSPITAL_COMMUNITY): Payer: Self-pay | Admitting: Dietician

## 2018-07-20 VITALS — BP 147/80 | HR 75 | Temp 98.6°F | Resp 18

## 2018-07-20 DIAGNOSIS — G893 Neoplasm related pain (acute) (chronic): Secondary | ICD-10-CM | POA: Diagnosis not present

## 2018-07-20 DIAGNOSIS — C7800 Secondary malignant neoplasm of unspecified lung: Secondary | ICD-10-CM

## 2018-07-20 DIAGNOSIS — C20 Malignant neoplasm of rectum: Secondary | ICD-10-CM

## 2018-07-20 DIAGNOSIS — Z9221 Personal history of antineoplastic chemotherapy: Secondary | ICD-10-CM | POA: Diagnosis not present

## 2018-07-20 DIAGNOSIS — Z923 Personal history of irradiation: Secondary | ICD-10-CM | POA: Diagnosis not present

## 2018-07-20 DIAGNOSIS — Z5112 Encounter for antineoplastic immunotherapy: Secondary | ICD-10-CM | POA: Diagnosis not present

## 2018-07-20 LAB — URINALYSIS, DIPSTICK ONLY
Bilirubin Urine: NEGATIVE
Glucose, UA: >=500 mg/dL — AB
Hgb urine dipstick: NEGATIVE
Ketones, ur: NEGATIVE mg/dL
Leukocytes,Ua: NEGATIVE
Nitrite: NEGATIVE
Protein, ur: 30 mg/dL — AB
Specific Gravity, Urine: 1.028 (ref 1.005–1.030)
pH: 5 (ref 5.0–8.0)

## 2018-07-20 MED ORDER — SODIUM CHLORIDE 0.9 % IV SOLN
700.0000 mg | Freq: Once | INTRAVENOUS | Status: AC
  Administered 2018-07-20: 700 mg via INTRAVENOUS
  Filled 2018-07-20: qty 16

## 2018-07-20 MED ORDER — SODIUM CHLORIDE 0.9 % IV SOLN
Freq: Once | INTRAVENOUS | Status: AC
  Administered 2018-07-20: 12:00:00 via INTRAVENOUS

## 2018-07-20 MED ORDER — SODIUM CHLORIDE 0.9% FLUSH
10.0000 mL | INTRAVENOUS | Status: DC | PRN
  Administered 2018-07-20: 10 mL
  Filled 2018-07-20: qty 10

## 2018-07-20 MED ORDER — HEPARIN SOD (PORK) LOCK FLUSH 100 UNIT/ML IV SOLN
500.0000 [IU] | Freq: Once | INTRAVENOUS | Status: AC | PRN
  Administered 2018-07-20: 500 [IU]

## 2018-07-20 NOTE — Progress Notes (Signed)
Nutrition Brief Note  Briefly following up with patient today regarding creon tolerance. Last week, RD suggested reducing his dose to 36k with meals to see if this increased his tolerance.  Today, he says his loose stools HAVE improved, but he also reports that he started taking scheduled lomotil this past week. As such, it is impossible to know if the creon is what is helping his loose stools, or the lomotil.   His spouse reports the creon prescription is $2200 before insurance. Would hate for pt to be paying for this medication when it really is not bestowing a benefit. RD recommended that he try holding the creon for a couple days and see if his stools become loose and uncontrolled again. If so, then he is deriving benefit from the creon. If not, then his chronic diarrhea is likely not related to his pancreatic atrophy and creon would not be indicated  Otherwise, pt reports feeling much better than he did last week. He says he is up 4 lbs and is eating better. They again decline offer for Ensure.   Burtis Junes RD, LDN, CNSC Clinical Nutrition Available Tues-Sat via Pager: [phone] 07/20/2018 12:40 PM

## 2018-07-20 NOTE — Progress Notes (Signed)
Parameters met for treatment today.

## 2018-07-20 NOTE — Progress Notes (Signed)
Urine collected for treatment.

## 2018-07-20 NOTE — Patient Instructions (Signed)
[facility] Discharge Instructions for Patients Receiving Chemotherapy  Today you received the following chemotherapy agents  If you develop nausea and vomiting that is not controlled by your nausea medication, call the clinic.   BELOW ARE SYMPTOMS THAT SHOULD BE REPORTED IMMEDIATELY:  *FEVER GREATER THAN 100.5 F  *CHILLS WITH OR WITHOUT FEVER  NAUSEA AND VOMITING THAT IS NOT CONTROLLED WITH YOUR NAUSEA MEDICATION  *UNUSUAL SHORTNESS OF BREATH  *UNUSUAL BRUISING OR BLEEDING  TENDERNESS IN MOUTH AND THROAT WITH OR WITHOUT PRESENCE OF ULCERS  *URINARY PROBLEMS  *BOWEL PROBLEMS  UNUSUAL RASH Items with * indicate a potential emergency and should be followed up as soon as possible.  Feel free to call the clinic should you have any questions or concerns. The clinic phone number is [phone].  Please show the CHEMO ALERT CARD at check-in to the Emergency Department and triage nurse.   

## 2018-07-20 NOTE — Progress Notes (Signed)
Patient tolerated chemotherapy with no complaints voiced.  Port site clean and dry with no bruising or swelling noted at site.  Good blood return noted before and after administration of chemotherapy.  Band aid applied.  Patient left ambulatory with VSS and no s/s of distress noted.

## 2018-07-23 ENCOUNTER — Other Ambulatory Visit (HOSPITAL_COMMUNITY): Payer: Self-pay | Admitting: *Deleted

## 2018-07-23 DIAGNOSIS — G620 Drug-induced polyneuropathy: Secondary | ICD-10-CM

## 2018-07-23 DIAGNOSIS — T451X5A Adverse effect of antineoplastic and immunosuppressive drugs, initial encounter: Principal | ICD-10-CM

## 2018-07-23 MED ORDER — GABAPENTIN 300 MG PO CAPS: 900.0000 mg | ORAL_CAPSULE | Freq: Two times a day (BID) | ORAL | 1 refills | Status: DC

## 2018-07-30 ENCOUNTER — Ambulatory Visit (HOSPITAL_COMMUNITY)
Admission: RE | Admit: 2018-07-30 | Discharge: 2018-07-30 | Disposition: A | Discharge status: Home / Self Care | Payer: Medicare Other | Source: Ambulatory Visit | Attending: Nurse Practitioner | Admitting: Nurse Practitioner

## 2018-07-30 ENCOUNTER — Other Ambulatory Visit (HOSPITAL_COMMUNITY): Payer: Medicare Other

## 2018-07-30 ENCOUNTER — Other Ambulatory Visit: Payer: Self-pay

## 2018-07-30 ENCOUNTER — Encounter (HOSPITAL_COMMUNITY): Payer: Self-pay

## 2018-07-30 DIAGNOSIS — C7889 Secondary malignant neoplasm of other digestive organs: Secondary | ICD-10-CM | POA: Diagnosis not present

## 2018-07-30 DIAGNOSIS — C187 Malignant neoplasm of sigmoid colon: Secondary | ICD-10-CM | POA: Diagnosis not present

## 2018-07-30 DIAGNOSIS — C7800 Secondary malignant neoplasm of unspecified lung: Secondary | ICD-10-CM | POA: Diagnosis not present

## 2018-07-30 DIAGNOSIS — C20 Malignant neoplasm of rectum: Secondary | ICD-10-CM | POA: Insufficient documentation

## 2018-07-30 MED ORDER — IOHEXOL 300 MG/ML  SOLN
100.0000 mL | Freq: Once | INTRAMUSCULAR | Status: AC | PRN
  Administered 2018-07-30: 100 mL via INTRAVENOUS

## 2018-08-03 ENCOUNTER — Ambulatory Visit (HOSPITAL_COMMUNITY): Payer: Medicare Other | Admitting: Hematology

## 2018-08-03 ENCOUNTER — Ambulatory Visit (HOSPITAL_COMMUNITY): Payer: Medicare Other

## 2018-08-04 MED FILL — XELODA 500 MG TABLET: 500 | 21 days supply | Qty: 84 | Fill #1

## 2018-08-10 ENCOUNTER — Inpatient Hospital Stay (HOSPITAL_COMMUNITY): Payer: Medicare Other

## 2018-08-10 ENCOUNTER — Encounter (HOSPITAL_COMMUNITY): Payer: Self-pay | Admitting: Dietician

## 2018-08-10 ENCOUNTER — Inpatient Hospital Stay (HOSPITAL_COMMUNITY): Payer: Medicare Other | Attending: Hematology | Admitting: Hematology

## 2018-08-10 ENCOUNTER — Encounter (HOSPITAL_COMMUNITY): Payer: Self-pay | Admitting: Hematology

## 2018-08-10 ENCOUNTER — Other Ambulatory Visit: Payer: Self-pay

## 2018-08-10 DIAGNOSIS — G893 Neoplasm related pain (acute) (chronic): Secondary | ICD-10-CM | POA: Insufficient documentation

## 2018-08-10 DIAGNOSIS — C7800 Secondary malignant neoplasm of unspecified lung: Secondary | ICD-10-CM

## 2018-08-10 DIAGNOSIS — Z86718 Personal history of other venous thrombosis and embolism: Secondary | ICD-10-CM

## 2018-08-10 DIAGNOSIS — Z7901 Long term (current) use of anticoagulants: Secondary | ICD-10-CM

## 2018-08-10 DIAGNOSIS — Z9221 Personal history of antineoplastic chemotherapy: Secondary | ICD-10-CM | POA: Insufficient documentation

## 2018-08-10 DIAGNOSIS — Z5112 Encounter for antineoplastic immunotherapy: Secondary | ICD-10-CM | POA: Insufficient documentation

## 2018-08-10 DIAGNOSIS — R195 Other fecal abnormalities: Secondary | ICD-10-CM

## 2018-08-10 DIAGNOSIS — C20 Malignant neoplasm of rectum: Secondary | ICD-10-CM

## 2018-08-10 DIAGNOSIS — G629 Polyneuropathy, unspecified: Secondary | ICD-10-CM | POA: Diagnosis not present

## 2018-08-10 DIAGNOSIS — Z923 Personal history of irradiation: Secondary | ICD-10-CM | POA: Insufficient documentation

## 2018-08-10 DIAGNOSIS — Z79899 Other long term (current) drug therapy: Secondary | ICD-10-CM

## 2018-08-10 LAB — COMPREHENSIVE METABOLIC PANEL
ALT: 17 U/L (ref 0–44)
AST: 28 U/L (ref 15–41)
Albumin: 3.6 g/dL (ref 3.5–5.0)
Alkaline Phosphatase: 93 U/L (ref 38–126)
Anion gap: 7 (ref 5–15)
BUN: 12 mg/dL (ref 8–23)
CO2: 23 mmol/L (ref 22–32)
Calcium: 8.7 mg/dL — ABNORMAL LOW (ref 8.9–10.3)
Chloride: 108 mmol/L (ref 98–111)
Creatinine, Ser: 0.7 mg/dL (ref 0.61–1.24)
GFR calc Af Amer: >60 mL/min (ref >60)
GFR calc non Af Amer: >60 mL/min (ref >60)
Glucose, Bld: 186 mg/dL — ABNORMAL HIGH (ref 70–99)
Potassium: 4.9 mmol/L (ref 3.5–5.1)
Sodium: 138 mmol/L (ref 135–145)
Total Bilirubin: 1.6 mg/dL — ABNORMAL HIGH (ref 0.3–1.2)
Total Protein: 6.2 g/dL — ABNORMAL LOW (ref 6.5–8.1)

## 2018-08-10 LAB — URINALYSIS, DIPSTICK ONLY
Bilirubin Urine: NEGATIVE
Glucose, UA: NEGATIVE mg/dL
Hgb urine dipstick: NEGATIVE
Ketones, ur: NEGATIVE mg/dL
Leukocytes,Ua: NEGATIVE
Nitrite: NEGATIVE
Protein, ur: 100 mg/dL — AB
Specific Gravity, Urine: 1.023 (ref 1.005–1.030)
pH: 5 (ref 5.0–8.0)

## 2018-08-10 LAB — CBC WITH DIFFERENTIAL/PLATELET
Abs Immature Granulocytes: 0.01 10*3/uL (ref 0.00–0.07)
Basophils Absolute: 0 10*3/uL (ref 0.0–0.1)
Basophils Relative: 1 %
Eosinophils Absolute: 0.2 10*3/uL (ref 0.0–0.5)
Eosinophils Relative: 3 %
HCT: 40.1 % (ref 39.0–52.0)
Hemoglobin: 13.5 g/dL (ref 13.0–17.0)
Immature Granulocytes: 0 %
Lymphocytes Relative: 23 %
Lymphs Abs: 1.5 10*3/uL (ref 0.7–4.0)
MCH: 33.8 pg (ref 26.0–34.0)
MCHC: 33.7 g/dL (ref 30.0–36.0)
MCV: 100.5 fL — ABNORMAL HIGH (ref 80.0–100.0)
Monocytes Absolute: 0.5 10*3/uL (ref 0.1–1.0)
Monocytes Relative: 8 %
Neutro Abs: 4.2 10*3/uL (ref 1.7–7.7)
Neutrophils Relative %: 65 %
Platelets: 210 10*3/uL (ref 150–400)
RBC: 3.99 MIL/uL — ABNORMAL LOW (ref 4.22–5.81)
RDW: 15.9 % — ABNORMAL HIGH (ref 11.5–15.5)
WBC: 6.4 10*3/uL (ref 4.0–10.5)
nRBC: 0 % (ref 0.0–0.2)

## 2018-08-10 LAB — MAGNESIUM: Magnesium: 1.8 mg/dL (ref 1.7–2.4)

## 2018-08-10 MED ORDER — DIPHENOXYLATE-ATROPINE 2.5-0.025 MG PO TABS: 1.0000 | ORAL_TABLET | Freq: Four times a day (QID) | ORAL | 3 refills | Status: DC | PRN

## 2018-08-10 NOTE — Progress Notes (Signed)
Charles [name], [place] [id number]   CLINIC:  Medical Oncology/Hematology  PCP:  [name], MD [place] [address] [phone]   REASON FOR VISIT:  Follow-up for rectal cancer  CURRENT THERAPY:Xeloda tabs 2 weeks on 1 week off and Bevacizumab   BRIEF ONCOLOGIC HISTORY:    Rectal cancer ([place])   07/24/2010 Initial Diagnosis    Invasive adenocarcinoma of rectum    09/09/2010 Concurrent Chemotherapy    S/P radiation and concurrent 5-FU continuous infusion from 09/09/10- 10/10/10.    11/14/2010 Surgery    S/P proctectomy with colorectal anastomosis and diverting loop ileostomy on 11/14/10 at [facility] by Dr. Harlon Ditty. Pathology reveals a pT3b N1 with 3/20 lymph nodes.    02/05/2011 - 07/14/2011 Chemotherapy    FOLFOX    08/18/2011 Surgery    Approximate date of surgery- [place] by Dr. Harlon Ditty     Remission       11/24/2013 Survivorship    Colonoscopy- somewhat fibrotic/friable anastomotic mucosal-status post biopsy (narrowing not felt to be clinically significant).  Negative pathology for malignancy    09/12/2014 Imaging    DVT in the left femoral venous system, left common iliac vein, IVC, and within the IVC filter Right lower extremity venography confirms chronic occlusion of the femoral venous system with collateralization. The right iliac venous system is patent and do    09/25/2014 PET scan    The right middle lobe pulmonary nodule is hypermetabolic, favored to represent a primary bronchogenic carcinoma.Equivocal mediastinal nodes, similar to surrounding blood pool. Bilateral adrenal hypermetabolism, felt to be physiologic    10/24/2014 Pathology Results    Lung, needle/core biopsy(ies), RML - ADENOCARCINOMA, SEE COMMENT metastatic adenocarcinoma of a colorectal primary    10/24/2014 Relapse/Recurrence       11/16/2014 Definitive Surgery    Bronchoscopy, right video-assisted thoracoscopy, wedge resection of right middle  lobe by Dr. Servando Snare    11/16/2014 Pathology Results    Lung, wedge biopsy/resection, right lingula and small portion of middle lobe - METASTATIC ADENOCARCINOMA, CONSISTENT WITH COLORECTAL PRIMARY, SPANNING 2.0 CM. - THE SURGICAL RESECTION MARGINS ARE NEGATIVE FOR ADENOCARCINOMA.    11/16/2014 Remission    THE SURGICAL RESECTION MARGINS ARE NEGATIVE FOR ADENOCARCINOMA.    03/07/2015 Imaging    CT CAP- Interval resection of right middle lobe metastasis. No acute process or evidence of metastatic disease in the chest, abdomen or pelvis. Improved right upper lobe reticular nodular opacities are favored to represent resolving infection.    09/14/2015 Imaging    CT CAP- No findings of recurrent malignancy. No recurrence along the wedge resection site of the right middle lobe. Right anterior abdominal wall focal hernia containing a knuckle of small bowel without complicating feature.    06/13/2016 Imaging    CT CAP- 1. New right-sided pleural metastasis/mass. Other smaller right-sided pulmonary nodules which are all pleural-based and most likely represent pleural metastasis. 2. No convincing evidence of abdominopelvic nodal metastasis. 3. Constellation of findings, including pancreatic atrophy, duct dilatation, and pancreatic head soft tissue fullness which are highly suspicious for pancreatic adenocarcinoma. Metastatic disease felt much less likely. Consider endoscopic ultrasound sampling or ERCP. Cannot exclude superimposed acute pancreatitis. 4. New enlargement of the appendiceal tip with subtle surrounding edema. Cannot exclude early or mild appendicitis. 5. Coronary artery atherosclerosis. Aortic atherosclerosis. 6. 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    06/13/2016 Progression    CT scan demonstrates progression  of disease    06/19/2016 Procedure    EUS with FNA by Dr. Ardis Hughs    06/20/2016 Pathology Results    FINE NEEDLE ASPIRATION, ENDOSCOPIC, PANCREAS  UNCINATE AREA(SPECIMEN 1 OF 1 COLLECTED 06/19/16): MALIGNANT CELLS CONSISTENT WITH METASTATIC ADENOCARCINOMA.    06/25/2016 PET scan    1. Two pleural-based nodules in the right hemithorax are hypermetabolic. Metastatic disease is a distinct consideration. The scattered pulmonary parenchymal nodule seen on previous diagnostic CT imaging are below the threshold for reliable resolution on PET imaging. 2. Hypermetabolic lesion pancreatic head, consistent with neoplasm. As noted on prior CT, adenocarcinoma is any consideration. No evidence for hypermetabolic abdominal lymphadenopathy. Mottled uptake noted in the liver, but no discrete hepatic metastases are evident on PET imaging. 3. Appendix remains distended up to 0.9-10 mm diameter with a stone towards the tip in subtle periappendiceal edema/inflammation. The appendix is hypermetabolic along its length. Imaging features are relatively stable in the 12 day interval since prior CT scan. While appendicitis is a consideration, the relative stability over 12 days would be unusual for that etiology. Continued close follow-up recommended.    06/26/2016 Pathology Results    Not enough tissue for foundationONE or K-ras testing.    07/07/2016 Procedure    Port placed by Dr. Arnoldo Morale    07/08/2016 -  Chemotherapy    The patient had pegfilgrastim (NEULASTA) injection 6 mg, 6 mg, Subcutaneous,  Once, 0 of 4 cycles  ondansetron (ZOFRAN) IVPB 8 mg, 8 mg, Intravenous,  Once, 0 of 4 cycles  leucovorin 804 mg in dextrose 5 % 250 mL infusion, 400 mg/m2, Intravenous,  Once, 0 of 4 cycles  oxaliplatin (ELOXATIN) 170 mg in dextrose 5 % 500 mL chemo infusion, 85 mg/m2, Intravenous,  Once, 0 of 4 cycles  fluorouracil (ADRUCIL) 4,800 mg in sodium chloride 0.9 % 150 mL chemo infusion, 2,400 mg/m2 = 4,800 mg, Intravenous, 1 Day/Dose, 0 of 4 cycles  fluorouracil (ADRUCIL) chemo injection 800 mg, 400 mg/m2, Intravenous,  Once, 0 of 4 cycles  pegfilgrastim  (NEULASTA) injection 6 mg, 6 mg, Subcutaneous,  Once, 2 of 2 cycles  ondansetron (ZOFRAN) IVPB 8 mg, 8 mg, Intravenous,  Once, 12 of 12 cycles  leucovorin 660 mg in dextrose 5 % 250 mL infusion, 660 mg (original dose ), Intravenous,  Once, 12 of 12 cycles Dose modification: 660 mg (Cycle 1)  oxaliplatin (ELOXATIN) 140 mg in dextrose 5 % 500 mL chemo infusion, 140 mg (original dose ), Intravenous,  Once, 12 of 12 cycles Dose modification: 140 mg (Cycle 1), 119 mg (85 % of original dose 140 mg, Cycle 8, Reason: Provider Judgment)  fluorouracil (ADRUCIL) 3,850 mg in sodium chloride 0.9 % 150 mL chemo infusion, 3,850 mg (original dose ), Intravenous, 1 Day/Dose, 12 of 12 cycles Dose modification: 3,850 mg (Cycle 1)  fluorouracil (ADRUCIL) chemo injection 700 mg, 700 mg (original dose ), Intravenous,  Once, 12 of 12 cycles Dose modification: 700 mg (Cycle 1)  palonosetron (ALOXI) injection 0.25 mg, 0.25 mg, Intravenous,  Once, 7 of 12 cycles Administration: 0.25 mg (09/30/2016)  bevacizumab (AVASTIN) 475 mg in sodium chloride 0.9 % 100 mL chemo infusion, 5 mg/kg = 475 mg, Intravenous,  Once, 6 of 11 cycles Administration: 500 mg (09/30/2016)  leucovorin 880 mg in dextrose 5 % 250 mL infusion, 400 mg/m2 = 880 mg, Intravenous,  Once, 7 of 12 cycles Administration: 880 mg (09/30/2016)  oxaliplatin (ELOXATIN) 185 mg in dextrose 5 % 500 mL chemo infusion, 85 mg/m2 =  185 mg, Intravenous,  Once, 7 of 12 cycles Administration: 185 mg (09/30/2016)  fluorouracil (ADRUCIL) chemo injection 900 mg, 400 mg/m2 = 900 mg, Intravenous,  Once, 7 of 12 cycles Administration: 900 mg (09/30/2016)  fluorouracil (ADRUCIL) 5,300 mg in sodium chloride 0.9 % 144 mL chemo infusion, 2,400 mg/m2 = 5,300 mg, Intravenous, 1 Day/Dose, 7 of 12 cycles Administration: 5,300 mg (09/30/2016)  for chemotherapy treatment.      08/27/2016 Imaging    CT CAP- 1. Pulmonary metastatic disease is stable. 2. Pancreatic head mass,  grossly stable, with progressive atrophy of the body and tail of the pancreas. Stable peripancreatic lymph node. 3. Mild circumferential rectal wall thickening, stable. 4. Aortic atherosclerosis (ICD10-170.0). Coronary artery calcification. 5. Hyperattenuating lesion off the lower pole left kidney, stable, too small to characterize. Continued attention on followup exams is warranted. 6. Persistent wall thickening and mild dilatation of the proximal appendix, of uncertain etiology. Continued attention on followup exams is warranted.    11/06/2016 Imaging    CT C/A/P: IMPRESSION: 1. Stable pulmonary metastatic disease. 2. Similar appearing pancreatic head mass. Progressed atrophy of the pancreatic body and tail. Stable peripancreatic lymph node. 3. Stable hypoattenuating lesion partially exophytic off the inferior pole of the left kidney. 4. Aortic atherosclerosis. 5. Persistent mild wall thickening and mild dilatation of the appendix.     01/30/2017 Imaging    Ct C/A/P: IMPRESSION: 1. Stable pulmonary metastatic disease. 2. Stable to slight increase in size of pancreatic head mass. 3.  Aortic Atherosclerosis (ICD10-I70.0). 4. Similar appearance of mild wall thickening of the appendix which contains an appendicolith.    05/01/2017 Imaging    CT C/A/P: Stable disease in the lungs, no progression of disease in the abdomen.      CANCER STAGING: Cancer Staging Rectal cancer ([place]) Staging form: Colon and Rectum, AJCC 7th Edition - Clinical: Stage IIIB (T3, N1, M0) - Signed by [name], MD on 02/04/2011 - Pathologic stage from 11/17/2014: Stage IVA (M1a) - Signed by Baird Cancer, PA-C on 09/14/2015    INTERVAL HISTORY:  Mr. Loe 68 y.o. male returns for routine follow-up and consideration for next cycle of chemotherapy. He is here today alone. He states that the pain he was having on his right side has gotten worse, sharp pinching feeling. He states that he  takes his pain medication when the pain gets bad. He has to take the lomotil once a day for his diarrhea. Denies any nausea, or vomiting. Denies any new pains. Had not noticed any recent bleeding such as epistaxis, hematuria or hematochezia. Denies recent chest pain on exertion, shortness of breath on minimal exertion, pre-syncopal episodes, or palpitations. Denies any numbness or tingling in hands or feet. Denies any recent fevers, infections, or recent hospitalizations. Patient reports appetite at 50% and energy level at 50%.   REVIEW OF SYSTEMS:  Review of Systems - Oncology   PAST MEDICAL/SURGICAL HISTORY:  Past Medical History:  Diagnosis Date  . Chronic anticoagulation   . Colon cancer ([place]) 07/24/2010   rectal ca, inv adenocarcinoma  . Depression 04/01/2011  . Diabetes mellitus without complication ([place])   . DVT (deep venous thrombosis) ([place]) 05/09/2011  . GERD (gastroesophageal reflux disease)   . High output ileostomy ([place]) 05/21/2011  . History of kidney stones   . HTN (hypertension)   . Hx of radiation therapy 09/02/10 to 10/14/10   pelvis  . Lung metastasis ([place])   . Neuropathy   . Peripheral vascular disease ([place])  dvt's,pe  . Pneumonia    hx x3  . Pulmonary embolism ([place])   . Rectal cancer ([place]) 01/15/2011   S/P radiation and concurrent 5-FU continuous infusion from 09/09/10- 10/10/10.  S/P proctectomy with colorectal anastomosis and diverting loop ileostomy on 11/14/10 at [facility] by Dr. Harlon Ditty. Pathology reveals a pT3b N1 with 3/20 lymph nodes.     Past Surgical History:  Procedure Laterality Date  . COLON SURGERY  11/14/2010   proctectomy with colorectal anastomosis and diverting loop ileostomy (temporary planned)  . COLONOSCOPY  07/2010   proximal rectal apple core mass 10-14cm from anal verge (adenocarcinoma), 2-3cm distal rectal carpet polyp s/p piecemeal snare polypectomy (adenoma)  . COLONOSCOPY  04/21/2012   RMR: Friable,fibrotic appearing colorectal  anastomosis producing some luminal narrowing-not felt to be critical. path: focal erosion with slight inflammation and hyperemia. SURVEILLANCE DUE DEC 2015  . COLONOSCOPY N/A 11/24/2013   Dr. Rourk:somewhat fibrotic/friable anastomotic mucosal-status post biopsy (narrowing not felt to be clinically significant) Single colonic diverticulum. benign polypoid rectal mucosa  . colostomy reversal  april 2013  . ESOPHAGOGASTRODUODENOSCOPY  07/2010   RMR: schatki ring s/p dilation, small hh, SB bx benign  . ESOPHAGOGASTRODUODENOSCOPY N/A 09/04/2015   Procedure: ESOPHAGOGASTRODUODENOSCOPY (EGD);  Surgeon: Daneil Dolin, MD;  Location: [facility] ENDO SUITE;  Service: Endoscopy;  Laterality: N/A;  730  . EUS  08/2010   Dr. Owens Loffler. uT3N0 circumferential, nearly obstruction rectosigmoid adenocarcinoma, distal edge 12cm from anal verge  . EUS N/A 06/19/2016   Procedure: UPPER ENDOSCOPIC ULTRASOUND (EUS) RADIAL;  Surgeon: Milus Banister, MD;  Location: [facility] ENDOSCOPY;  Service: Endoscopy;  Laterality: N/A;  . HERNIA REPAIR     abd hernia repair  . IVC filter    . ivc filter    . port a cath placement    . PORT-A-CATH REMOVAL  09/24/2011   Procedure: REMOVAL PORT-A-CATH;  Surgeon: Donato Heinz, MD;  Location: [facility] ORS;  Service: General;  Laterality: N/A;  Minor Room  . PORTACATH PLACEMENT Right 07/07/2016   Procedure: INSERTION PORT-A-CATH;  Surgeon: Aviva Signs, MD;  Location: [facility] ORS;  Service: General;  Laterality: Right;  Marland Kitchen VIDEO ASSISTED THORACOSCOPY (VATS)/WEDGE RESECTION Right 11/15/2014   Procedure: VIDEO ASSISTED THORACOSCOPY (VATS)/LUNG RESECTION WITH RIGHT LINGULECTOMY;  Surgeon: [name], MD;  Location: [facility] [place];  Service: Thoracic;  Laterality: Right;  Marland Kitchen VIDEO BRONCHOSCOPY N/A 11/15/2014   Procedure: VIDEO BRONCHOSCOPY;  Surgeon: [name], MD;  Location: [facility] OR;  Service: Thoracic;  Laterality: N/A;     SOCIAL HISTORY:  Social History   Socioeconomic History  . Marital status:  Married    Spouse name: Not on file  . Number of children: 2  . Years of education: Not on file  . Highest education level: Not on file  Occupational History  . Occupation: self-employed Regulatory affairs officer, currently not working    Fish farm manager: SELF EMPLOYED  Social Needs  . Financial resource strain: Not on file  . Food insecurity:    Worry: Not on file    Inability: Not on file  . Transportation needs:    Medical: Not on file    Non-medical: Not on file  Tobacco Use  . Smoking status: Never Smoker  . Smokeless tobacco: Never Used  Substance and Sexual Activity  . Alcohol use: No  . Drug use: No  . Sexual activity: Yes    Birth control/protection: None    Comment: married  Lifestyle  . Physical activity:  Days per week: Not on file    Minutes per session: Not on file  . Stress: Not on file  Relationships  . Social connections:    Talks on phone: Not on file    Gets together: Not on file    Attends religious service: Not on file    Active member of club or organization: Not on file    Attends meetings of clubs or organizations: Not on file    Relationship status: Not on file  . Intimate partner violence:    Fear of current or ex partner: Not on file    Emotionally abused: Not on file    Physically abused: Not on file    Forced sexual activity: Not on file  Other Topics Concern  . Not on file  Social History Narrative  . Not on file    FAMILY HISTORY:  Family History  Problem Relation Age of Onset  . Cancer Brother        throat  . Cancer Brother        prostate  . Colon cancer Neg Hx   . Liver disease Neg Hx   . Inflammatory bowel disease Neg Hx     CURRENT MEDICATIONS:  Outpatient Encounter Medications as of 08/10/2018  Medication Sig  . acetaminophen (TYLENOL) 500 MG tablet Take 1,000 mg by mouth every 6 (six) hours as needed.  . capecitabine (XELODA) 500 MG tablet TAKE 3 TABLETS BY MOUTH TWICE DAILY FOR 14 DAYS IN A ROW FOLLOWED BY A 7 DAY  REST PERIOD. TAKE AFTER MEAL  . clonazePAM (KLONOPIN) 0.5 MG tablet Take 1 tablet (0.5 mg total) by mouth 2 (two) times daily as needed.  . Diphenhyd-Hydrocort-Nystatin (FIRST-DUKES MOUTHWASH) SUSP Use as directed 5 mLs in the mouth or throat 4 (four) times daily.  . diphenoxylate-atropine (LOMOTIL) 2.5-0.025 MG tablet Take 1 tablet by mouth 4 (four) times daily as needed for diarrhea or loose stools.  . gabapentin (NEURONTIN) 300 MG capsule Take 3 capsules (900 mg total) by mouth 2 (two) times daily.  Marland Kitchen glimepiride (AMARYL) 2 MG tablet Take 2 mg by mouth daily with breakfast.   . HYDROcodone-acetaminophen (NORCO) 5-325 MG tablet Take 1-2 tablets by mouth every 6 (six) hours as needed for moderate pain.  Marland Kitchen lipase/protease/amylase (CREON) 36000 UNITS CPEP capsule Take 2 capsules (72,000 Units total) by mouth 3 (three) times daily with meals. Take one capsule with snacks  . lisinopril (PRINIVIL,ZESTRIL) 10 MG tablet   . magnesium oxide (MAG-OX) 400 (241.3 Mg) MG tablet Take 1 tablet (400 mg total) by mouth 2 (two) times daily.  . mirtazapine (REMERON) 7.5 MG tablet Take 1 tablet (7.5 mg total) by mouth at bedtime.  Marland Kitchen omeprazole (PRILOSEC) 20 MG capsule Take 20 mg by mouth 2 (two) times daily before a meal.   . ondansetron (ZOFRAN) 8 MG tablet Take 1 tablet (8 mg total) by mouth 2 (two) times daily as needed for refractory nausea / vomiting. Start on day 3 after chemotherapy.  . prochlorperazine (COMPAZINE) 10 MG tablet Take 1 tablet (10 mg total) by mouth every 6 (six) hours as needed (Nausea or vomiting).  . psyllium (REGULOID) 0.52 g capsule Take 0.52 g by mouth daily.  . rivaroxaban (XARELTO) 20 MG TABS tablet Take 1 tablet (20 mg total) by mouth daily with supper.  . traZODone (DESYREL) 100 MG tablet Take 100 mg by mouth at bedtime.  . [DISCONTINUED] diphenoxylate-atropine (LOMOTIL) 2.5-0.025 MG tablet Take 1 tablet by mouth 4 (four)  times daily as needed for diarrhea or loose stools.    Facility-Administered Encounter Medications as of 08/10/2018  Medication  . sodium chloride 0.9 % injection 10 mL  . sodium chloride flush (NS) 0.9 % injection 10 mL    ALLERGIES:  Allergies  Allergen Reactions  . Oxycodone     Blisters, hallucinations  . Tramadol     Blisters, hallucinations  . Trazodone And Nefazodone Other (See Comments)    hallucinations     PHYSICAL EXAM:  ECOG Performance status: 1  Blood pressure is 148/76.  Pulse rate is 84.  Respiratory 16.  Temperature 98. Physical Exam Vitals signs reviewed.  Constitutional:      Appearance: Normal appearance.  Cardiovascular:     Rate and Rhythm: Normal rate and regular rhythm.     Heart sounds: Normal heart sounds.  Pulmonary:     Effort: Pulmonary effort is normal.     Breath sounds: Normal breath sounds.  Abdominal:     General: Bowel sounds are normal. There is no distension.     Palpations: Abdomen is soft. There is no mass.  Musculoskeletal:        General: No swelling.  Skin:    General: Skin is warm.  Neurological:     General: No focal deficit present.     Mental Status: He is alert and oriented to person, place, and time.  Psychiatric:        Mood and Affect: Mood normal.        Behavior: Behavior normal.      LABORATORY DATA:  I have reviewed the labs as listed.  CBC    Component Value Date/Time   WBC 6.4 08/10/2018 0920   RBC 3.99 (L) 08/10/2018 0920   HGB 13.5 08/10/2018 0920   HGB 11.6 (L) 02/04/2011 1059   HCT 40.1 08/10/2018 0920   HCT 34.7 (L) 02/04/2011 1059   PLT 210 08/10/2018 0920   PLT 282 02/04/2011 1059   MCV 100.5 (H) 08/10/2018 0920   MCV 76.0 (L) 02/04/2011 1059   MCH 33.8 08/10/2018 0920   MCHC 33.7 08/10/2018 0920   RDW 15.9 (H) 08/10/2018 0920   RDW 18.5 (H) 02/04/2011 1059   LYMPHSABS 1.5 08/10/2018 0920   LYMPHSABS 1.1 02/04/2011 1059   MONOABS 0.5 08/10/2018 0920   MONOABS 0.5 02/04/2011 1059   EOSABS 0.2 08/10/2018 0920   EOSABS 0.1 02/04/2011 1059    BASOSABS 0.0 08/10/2018 0920   BASOSABS 0.1 02/04/2011 1059   CMP Latest Ref Rng & Units 08/10/2018 07/13/2018 06/22/2018  Glucose 70 - 99 mg/dL 186(H) 284(H) 347(H)  BUN 8 - 23 mg/dL '12 13 12  ' Creatinine 0.61 - 1.24 mg/dL 0.70 0.72 0.87  Sodium 135 - 145 mmol/L 138 138 138  Potassium 3.5 - 5.1 mmol/L 4.9 3.5 4.4  Chloride 98 - 111 mmol/L 108 109 108  CO2 22 - 32 mmol/L 23 21(L) 23  Calcium 8.9 - 10.3 mg/dL 8.7(L) 8.2(L) 8.5(L)  Total Protein 6.5 - 8.1 g/dL 6.2(L) 5.8(L) 6.0(L)  Total Bilirubin 0.3 - 1.2 mg/dL 1.6(H) 1.8(H) 2.0(H)  Alkaline Phos 38 - 126 U/L 93 78 84  AST 15 - 41 U/L '28 24 24  ' ALT 0 - 44 U/L 17 26 32       DIAGNOSTIC IMAGING:  I have independently reviewed the scans and discussed with the patient.   I have reviewed Venita Lick LPN's note and agree with the documentation.  I personally performed a face-to-face visit, made revisions  and my assessment and plan is as follows.    ASSESSMENT & PLAN:   Rectal cancer 1.  Stage IV rectal cancer with lung and pancreatic metastasis: -He was on maintenance 5-FU and bevacizumab.  He started Xeloda first cycle on 08/07/2017 and took 4 pills twice a day.  He is on a 2 weeks on 1 week of regimen.  He felt very tired after starting Xeloda.  He also had flulike symptoms.  His appetite has gone down. - We have cut back on Xeloda dose to 1500 mg twice a day during cycle 2.  -CT CAP on 05/28/2018 show slight increase in the right upper lobe lung nodule measuring 11 x 6 mm compared to 8 x 6 mm.  No new pulmonary nodules.  No evidence of metastatic disease in the liver or abdomen.  No mediastinal adenopathy.  - He is tolerating Xeloda and bevacizumab very well. - We reviewed results of CT CAP on 07/30/2018 which showed no significant progressive metastatic disease.  No significant change in the right upper lobe solid pulmonary nodules.  Stable mixed lytic and sclerotic lower sternal lesion.  Stable malignant pancreatic head mass. -I  recommended continuing same therapy.  He will start next cycle of Xeloda on 08/11/2018. - Today's UA showed elevated protein.  Hence I would hold his bevacizumab today.  We will recheck his UA next week and resume if it gets below 100. -He complains of occasional right upper quadrant sharp pain lasting few seconds which has gotten slightly worse recently.  The pain starts at the site of his previous surgery in the right lower rib region.  This appears like neuropathic pain. -I will see him back in 3 weeks from his next bevacizumab infusion next week.  2.  Peripheral neuropathy: -He is taking gabapentin 900 mg twice daily.  This is well controlled.   3.  DVT and PE: -Diagnosed in 2016 and IVC filter placed. -He will continue Xarelto 20 mg daily.  No bleeding issues reported.  4.  Rectal pain: -He will continue hydrocodone 5 mg once or twice a week as needed.  5.  Hypomagnesemia: -Magnesium is 1.8 today.  We will continue magnesium daily.       Orders placed this encounter:  No orders of the defined types were placed in this encounter.     Derek Jack, MD [place] [phone]

## 2018-08-10 NOTE — Patient Instructions (Addendum)
[place] [facility] Discharge Instructions  You were seen today by Dr. [name]. He went over your recent results. He will see you back in 1 week for labs, treatment and follow up.   Thank you for choosing [facility] to provide your oncology and hematology care.  To afford each patient quality time with our provider, please arrive at least 15 minutes before your scheduled appointment time.   If you have a lab appointment with the [facility] please come in thru the  Main Entrance and check in at the main information desk  You need to re-schedule your appointment should you arrive 10 or more minutes late.  We strive to give you quality time with our providers, and arriving late affects you and other patients whose appointments are after yours.  Also, if you no show three or more times for appointments you may be dismissed from the clinic at the providers discretion.     Again, thank you for choosing [facility].  Our hope is that these requests will decrease the amount of time that you wait before being seen by our physicians.       _____________________________________________________________  Should you have questions after your visit to [facility], please contact our office at [phone] between the hours of 8:00 a.m. and 4:30 p.m.  Voicemails left after 4:00 p.m. will not be returned until the following business day.  For prescription refill requests, have your pharmacy contact our office and allow 72 hours.    Cancer Center Support Programs:   > Cancer Support Group  2nd Tuesday of the month 1pm-2pm, Journey Room

## 2018-08-10 NOTE — Progress Notes (Signed)
Hold treatment today per MD. Will reschedule for next week.

## 2018-08-10 NOTE — Assessment & Plan Note (Addendum)
1.  Stage IV rectal cancer with lung and pancreatic metastasis: -He was on maintenance 5-FU and bevacizumab.  He started Xeloda first cycle on 08/07/2017 and took 4 pills twice a day.  He is on a 2 weeks on 1 week of regimen.  He felt very tired after starting Xeloda.  He also had flulike symptoms.  His appetite has gone down. - We have cut back on Xeloda dose to 1500 mg twice a day during cycle 2.  -CT CAP on 05/28/2018 show slight increase in the right upper lobe lung nodule measuring 11 x 6 mm compared to 8 x 6 mm.  No new pulmonary nodules.  No evidence of metastatic disease in the liver or abdomen.  No mediastinal adenopathy.  - He is tolerating Xeloda and bevacizumab very well. - We reviewed results of CT CAP on 07/30/2018 which showed no significant progressive metastatic disease.  No significant change in the right upper lobe solid pulmonary nodules.  Stable mixed lytic and sclerotic lower sternal lesion.  Stable malignant pancreatic head mass. -I recommended continuing same therapy.  He will start next cycle of Xeloda on 08/11/2018. - Today's UA showed elevated protein.  Hence I would hold his bevacizumab today.  We will recheck his UA next week and resume if it gets below 100. -He complains of occasional right upper quadrant sharp pain lasting few seconds which has gotten slightly worse recently.  The pain starts at the site of his previous surgery in the right lower rib region.  This appears like neuropathic pain. -I will see him back in 3 weeks from his next bevacizumab infusion next week.  2.  Peripheral neuropathy: -He is taking gabapentin 900 mg twice daily.  This is well controlled.   3.  DVT and PE: -Diagnosed in 2016 and IVC filter placed. -He will continue Xarelto 20 mg daily.  No bleeding issues reported.  4.  Rectal pain: -He will continue hydrocodone 5 mg once or twice a week as needed.  5.  Hypomagnesemia: -Magnesium is 1.8 today.  We will continue magnesium daily.

## 2018-08-10 NOTE — Progress Notes (Signed)
Nutrition Brief Note  Remotely following up with pt regarding status of chronic diarrhea and creon use.   Pt says he stopped taking creon and the effect this had was making his stools "less watery". Surprisingly, he says the creon was actually making his stools more loose, not less. Now that he is taking lomotil alone, his stools have firmed up and he is no longer having diarrhea. This would suggest pancreatic insufficiency is not cause of his chronic diarrhea. As such, he certainly does not need to refill this medication.   He says his weight today was 195 or so (not listed in chart at this time). He attributes this to Covid pandemic. He explains that he and his spouse have historically gone out to eat for nearly all meals. Now that restaurants are closed, he has to prepare meals at home. He is eating items such as sandwiches and crm potatoes, but these do not appeal to him nearly as well. He had been drinking 1 Ensure/day, but he says MD instructed him to increase this to 2/d at this appt this morning. RD agreed with this recommendation.   At this time, RD will follow more peripherally. Please reach out to dietitian if any nutrition concerns arise.   Burtis Junes RD, LDN, CNSC Clinical Nutrition Available Tues-Sat via Pager: [phone] 08/10/2018 1:40 PM

## 2018-08-11 LAB — CEA: CEA: 15.9 ng/mL — ABNORMAL HIGH (ref 0.0–4.7)

## 2018-08-18 ENCOUNTER — Other Ambulatory Visit: Payer: Self-pay

## 2018-08-19 ENCOUNTER — Other Ambulatory Visit (HOSPITAL_COMMUNITY): Payer: Self-pay | Admitting: Nurse Practitioner

## 2018-08-19 ENCOUNTER — Inpatient Hospital Stay (HOSPITAL_BASED_OUTPATIENT_CLINIC_OR_DEPARTMENT_OTHER): Payer: Medicare Other | Admitting: Hematology

## 2018-08-19 ENCOUNTER — Inpatient Hospital Stay (HOSPITAL_COMMUNITY): Payer: Medicare Other

## 2018-08-19 ENCOUNTER — Encounter (HOSPITAL_COMMUNITY): Payer: Self-pay

## 2018-08-19 ENCOUNTER — Encounter (HOSPITAL_COMMUNITY): Payer: Self-pay | Admitting: Hematology

## 2018-08-19 VITALS — BP 136/73 | HR 73 | Temp 97.7°F | Resp 18

## 2018-08-19 DIAGNOSIS — C20 Malignant neoplasm of rectum: Secondary | ICD-10-CM

## 2018-08-19 DIAGNOSIS — Z7901 Long term (current) use of anticoagulants: Secondary | ICD-10-CM

## 2018-08-19 DIAGNOSIS — Z9221 Personal history of antineoplastic chemotherapy: Secondary | ICD-10-CM

## 2018-08-19 DIAGNOSIS — Z79899 Other long term (current) drug therapy: Secondary | ICD-10-CM

## 2018-08-19 DIAGNOSIS — Z923 Personal history of irradiation: Secondary | ICD-10-CM

## 2018-08-19 DIAGNOSIS — Z5112 Encounter for antineoplastic immunotherapy: Secondary | ICD-10-CM | POA: Diagnosis not present

## 2018-08-19 DIAGNOSIS — C7800 Secondary malignant neoplasm of unspecified lung: Secondary | ICD-10-CM

## 2018-08-19 DIAGNOSIS — G629 Polyneuropathy, unspecified: Secondary | ICD-10-CM

## 2018-08-19 DIAGNOSIS — G893 Neoplasm related pain (acute) (chronic): Secondary | ICD-10-CM

## 2018-08-19 DIAGNOSIS — Z86718 Personal history of other venous thrombosis and embolism: Secondary | ICD-10-CM | POA: Diagnosis not present

## 2018-08-19 LAB — CBC WITH DIFFERENTIAL/PLATELET
Abs Immature Granulocytes: 0.01 10*3/uL (ref 0.00–0.07)
Basophils Absolute: 0 10*3/uL (ref 0.0–0.1)
Basophils Relative: 0 %
Eosinophils Absolute: 0.1 10*3/uL (ref 0.0–0.5)
Eosinophils Relative: 2 %
HCT: 37.9 % — ABNORMAL LOW (ref 39.0–52.0)
Hemoglobin: 12.8 g/dL — ABNORMAL LOW (ref 13.0–17.0)
Immature Granulocytes: 0 %
Lymphocytes Relative: 31 %
Lymphs Abs: 1.6 10*3/uL (ref 0.7–4.0)
MCH: 33.8 pg (ref 26.0–34.0)
MCHC: 33.8 g/dL (ref 30.0–36.0)
MCV: 100 fL (ref 80.0–100.0)
Monocytes Absolute: 0.4 10*3/uL (ref 0.1–1.0)
Monocytes Relative: 8 %
Neutro Abs: 3 10*3/uL (ref 1.7–7.7)
Neutrophils Relative %: 59 %
Platelets: 168 10*3/uL (ref 150–400)
RBC: 3.79 MIL/uL — ABNORMAL LOW (ref 4.22–5.81)
RDW: 15.5 % (ref 11.5–15.5)
WBC: 5.1 10*3/uL (ref 4.0–10.5)
nRBC: 0 % (ref 0.0–0.2)

## 2018-08-19 LAB — URINALYSIS, DIPSTICK ONLY
Bacteria, UA: NONE SEEN
Bilirubin Urine: NEGATIVE
Glucose, UA: NEGATIVE mg/dL
Hgb urine dipstick: NEGATIVE
Ketones, ur: NEGATIVE mg/dL
Leukocytes,Ua: NEGATIVE
Nitrite: NEGATIVE
Protein, ur: NEGATIVE mg/dL
Specific Gravity, Urine: 1.016 (ref 1.005–1.030)
pH: 5 (ref 5.0–8.0)

## 2018-08-19 LAB — COMPREHENSIVE METABOLIC PANEL
ALT: 31 U/L (ref 0–44)
AST: 39 U/L (ref 15–41)
Albumin: 3.4 g/dL — ABNORMAL LOW (ref 3.5–5.0)
Alkaline Phosphatase: 90 U/L (ref 38–126)
Anion gap: 7 (ref 5–15)
BUN: 11 mg/dL (ref 8–23)
CO2: 23 mmol/L (ref 22–32)
Calcium: 8.7 mg/dL — ABNORMAL LOW (ref 8.9–10.3)
Chloride: 106 mmol/L (ref 98–111)
Creatinine, Ser: 0.66 mg/dL (ref 0.61–1.24)
GFR calc Af Amer: >60 mL/min (ref >60)
GFR calc non Af Amer: >60 mL/min (ref >60)
Glucose, Bld: 190 mg/dL — ABNORMAL HIGH (ref 70–99)
Potassium: 3.7 mmol/L (ref 3.5–5.1)
Sodium: 136 mmol/L (ref 135–145)
Total Bilirubin: 1.1 mg/dL (ref 0.3–1.2)
Total Protein: 6.1 g/dL — ABNORMAL LOW (ref 6.5–8.1)

## 2018-08-19 MED ORDER — HEPARIN SOD (PORK) LOCK FLUSH 100 UNIT/ML IV SOLN
500.0000 [IU] | Freq: Once | INTRAVENOUS | Status: AC | PRN
  Administered 2018-08-19: 11:00:00 500 [IU]

## 2018-08-19 MED ORDER — SODIUM CHLORIDE 0.9 % IV SOLN
Freq: Once | INTRAVENOUS | Status: AC
  Administered 2018-08-19: 10:00:00 via INTRAVENOUS

## 2018-08-19 MED ORDER — SODIUM CHLORIDE 0.9% FLUSH
10.0000 mL | INTRAVENOUS | Status: DC | PRN
  Administered 2018-08-19: 10 mL
  Filled 2018-08-19: qty 10

## 2018-08-19 MED ORDER — SODIUM CHLORIDE 0.9 % IV SOLN
700.0000 mg | Freq: Once | INTRAVENOUS | Status: AC
  Administered 2018-08-19: 700 mg via INTRAVENOUS
  Filled 2018-08-19: qty 12

## 2018-08-19 NOTE — Progress Notes (Signed)
Charles [name], [name]   CLINIC:  Medical Oncology/Hematology  PCP:  [name], MD [name] [phone]   REASON FOR VISIT:  Follow-up for rectal cancer  CURRENT THERAPY:Xeloda tabs 2 weeks on 1 week off and Bevacizumab   BRIEF ONCOLOGIC HISTORY:    Rectal cancer ([place])   07/24/2010 Initial Diagnosis    Invasive adenocarcinoma of rectum    09/09/2010 Concurrent Chemotherapy    S/P radiation and concurrent 5-FU continuous infusion from 09/09/10- 10/10/10.    11/14/2010 Surgery    S/P proctectomy with colorectal anastomosis and diverting loop ileostomy on 11/14/[facility] by Dr. Harlon Ditty. Pathology reveals a pT3b N1 with 3/20 lymph nodes.    02/05/2011 - 07/14/2011 Chemotherapy    FOLFOX    08/18/2011 Surgery    Approximate date of surgery- [place] by Dr. Harlon Ditty     Remission       11/24/2013 Survivorship    Colonoscopy- somewhat fibrotic/friable anastomotic mucosal-status post biopsy (narrowing not felt to be clinically significant).  Negative pathology for malignancy    09/12/2014 Imaging    DVT in the left femoral venous system, left common iliac vein, IVC, and within the IVC filter Right lower extremity venography confirms chronic occlusion of the femoral venous system with collateralization. The right iliac venous system is patent and do    09/25/2014 PET scan    The right middle lobe pulmonary nodule is hypermetabolic, favored to represent a primary bronchogenic carcinoma.Equivocal mediastinal nodes, similar to surrounding blood pool. Bilateral adrenal hypermetabolism, felt to be physiologic    10/24/2014 Pathology Results    Lung, needle/core biopsy(ies), RML - ADENOCARCINOMA, SEE COMMENT metastatic adenocarcinoma of a colorectal primary    10/24/2014 Relapse/Recurrence       11/16/2014 Definitive Surgery    Bronchoscopy, right video-assisted thoracoscopy, wedge resection of right middle  lobe by Dr. Servando Snare    11/16/2014 Pathology Results    Lung, wedge biopsy/resection, right lingula and small portion of middle lobe - METASTATIC ADENOCARCINOMA, CONSISTENT WITH COLORECTAL PRIMARY, SPANNING 2.0 CM. - THE SURGICAL RESECTION MARGINS ARE NEGATIVE FOR ADENOCARCINOMA.    11/16/2014 Remission    THE SURGICAL RESECTION MARGINS ARE NEGATIVE FOR ADENOCARCINOMA.    03/07/2015 Imaging    CT CAP- Interval resection of right middle lobe metastasis. No acute process or evidence of metastatic disease in the chest, abdomen or pelvis. Improved right upper lobe reticular nodular opacities are favored to represent resolving infection.    09/14/2015 Imaging    CT CAP- No findings of recurrent malignancy. No recurrence along the wedge resection site of the right middle lobe. Right anterior abdominal wall focal hernia containing a knuckle of small bowel without complicating feature.    06/13/2016 Imaging    CT CAP- 1. New right-sided pleural metastasis/mass. Other smaller right-sided pulmonary nodules which are all pleural-based and most likely represent pleural metastasis. 2. No convincing evidence of abdominopelvic nodal metastasis. 3. Constellation of findings, including pancreatic atrophy, duct dilatation, and pancreatic head soft tissue fullness which are highly suspicious for pancreatic adenocarcinoma. Metastatic disease felt much less likely. Consider endoscopic ultrasound sampling or ERCP. Cannot exclude superimposed acute pancreatitis. 4. New enlargement of the appendiceal tip with subtle surrounding edema. Cannot exclude early or mild appendicitis. 5. Coronary artery atherosclerosis. Aortic atherosclerosis. 6. 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    06/13/2016 Progression    CT scan demonstrates progression  of disease    06/19/2016 Procedure    EUS with FNA by Dr. Ardis Hughs    06/20/2016 Pathology Results    FINE NEEDLE ASPIRATION, ENDOSCOPIC, PANCREAS  UNCINATE AREA(SPECIMEN 1 OF 1 COLLECTED 06/19/16): MALIGNANT CELLS CONSISTENT WITH METASTATIC ADENOCARCINOMA.    06/25/2016 PET scan    1. Two pleural-based nodules in the right hemithorax are hypermetabolic. Metastatic disease is a distinct consideration. The scattered pulmonary parenchymal nodule seen on previous diagnostic CT imaging are below the threshold for reliable resolution on PET imaging. 2. Hypermetabolic lesion pancreatic head, consistent with neoplasm. As noted on prior CT, adenocarcinoma is any consideration. No evidence for hypermetabolic abdominal lymphadenopathy. Mottled uptake noted in the liver, but no discrete hepatic metastases are evident on PET imaging. 3. Appendix remains distended up to 0.9-10 mm diameter with a stone towards the tip in subtle periappendiceal edema/inflammation. The appendix is hypermetabolic along its length. Imaging features are relatively stable in the 12 day interval since prior CT scan. While appendicitis is a consideration, the relative stability over 12 days would be unusual for that etiology. Continued close follow-up recommended.    06/26/2016 Pathology Results    Not enough tissue for foundationONE or K-ras testing.    07/07/2016 Procedure    Port placed by Dr. Arnoldo Morale    07/08/2016 -  Chemotherapy    The patient had pegfilgrastim (NEULASTA) injection 6 mg, 6 mg, Subcutaneous,  Once, 0 of 4 cycles  ondansetron (ZOFRAN) IVPB 8 mg, 8 mg, Intravenous,  Once, 0 of 4 cycles  leucovorin 804 mg in dextrose 5 % 250 mL infusion, 400 mg/m2, Intravenous,  Once, 0 of 4 cycles  oxaliplatin (ELOXATIN) 170 mg in dextrose 5 % 500 mL chemo infusion, 85 mg/m2, Intravenous,  Once, 0 of 4 cycles  fluorouracil (ADRUCIL) 4,800 mg in sodium chloride 0.9 % 150 mL chemo infusion, 2,400 mg/m2 = 4,800 mg, Intravenous, 1 Day/Dose, 0 of 4 cycles  fluorouracil (ADRUCIL) chemo injection 800 mg, 400 mg/m2, Intravenous,  Once, 0 of 4 cycles  pegfilgrastim  (NEULASTA) injection 6 mg, 6 mg, Subcutaneous,  Once, 2 of 2 cycles  ondansetron (ZOFRAN) IVPB 8 mg, 8 mg, Intravenous,  Once, 12 of 12 cycles  leucovorin 660 mg in dextrose 5 % 250 mL infusion, 660 mg (original dose ), Intravenous,  Once, 12 of 12 cycles Dose modification: 660 mg (Cycle 1)  oxaliplatin (ELOXATIN) 140 mg in dextrose 5 % 500 mL chemo infusion, 140 mg (original dose ), Intravenous,  Once, 12 of 12 cycles Dose modification: 140 mg (Cycle 1), 119 mg (85 % of original dose 140 mg, Cycle 8, Reason: Provider Judgment)  fluorouracil (ADRUCIL) 3,850 mg in sodium chloride 0.9 % 150 mL chemo infusion, 3,850 mg (original dose ), Intravenous, 1 Day/Dose, 12 of 12 cycles Dose modification: 3,850 mg (Cycle 1)  fluorouracil (ADRUCIL) chemo injection 700 mg, 700 mg (original dose ), Intravenous,  Once, 12 of 12 cycles Dose modification: 700 mg (Cycle 1)  palonosetron (ALOXI) injection 0.25 mg, 0.25 mg, Intravenous,  Once, 7 of 12 cycles Administration: 0.25 mg (09/30/2016)  bevacizumab (AVASTIN) 475 mg in sodium chloride 0.9 % 100 mL chemo infusion, 5 mg/kg = 475 mg, Intravenous,  Once, 6 of 11 cycles Administration: 500 mg (09/30/2016)  leucovorin 880 mg in dextrose 5 % 250 mL infusion, 400 mg/m2 = 880 mg, Intravenous,  Once, 7 of 12 cycles Administration: 880 mg (09/30/2016)  oxaliplatin (ELOXATIN) 185 mg in dextrose 5 % 500 mL chemo infusion, 85 mg/m2 =  185 mg, Intravenous,  Once, 7 of 12 cycles Administration: 185 mg (09/30/2016)  fluorouracil (ADRUCIL) chemo injection 900 mg, 400 mg/m2 = 900 mg, Intravenous,  Once, 7 of 12 cycles Administration: 900 mg (09/30/2016)  fluorouracil (ADRUCIL) 5,300 mg in sodium chloride 0.9 % 144 mL chemo infusion, 2,400 mg/m2 = 5,300 mg, Intravenous, 1 Day/Dose, 7 of 12 cycles Administration: 5,300 mg (09/30/2016)  for chemotherapy treatment.      08/27/2016 Imaging    CT CAP- 1. Pulmonary metastatic disease is stable. 2. Pancreatic head mass,  grossly stable, with progressive atrophy of the body and tail of the pancreas. Stable peripancreatic lymph node. 3. Mild circumferential rectal wall thickening, stable. 4. Aortic atherosclerosis (ICD10-170.0). Coronary artery calcification. 5. Hyperattenuating lesion off the lower pole left kidney, stable, too small to characterize. Continued attention on followup exams is warranted. 6. Persistent wall thickening and mild dilatation of the proximal appendix, of uncertain etiology. Continued attention on followup exams is warranted.    11/06/2016 Imaging    CT C/A/P: IMPRESSION: 1. Stable pulmonary metastatic disease. 2. Similar appearing pancreatic head mass. Progressed atrophy of the pancreatic body and tail. Stable peripancreatic lymph node. 3. Stable hypoattenuating lesion partially exophytic off the inferior pole of the left kidney. 4. Aortic atherosclerosis. 5. Persistent mild wall thickening and mild dilatation of the appendix.     01/30/2017 Imaging    Ct C/A/P: IMPRESSION: 1. Stable pulmonary metastatic disease. 2. Stable to slight increase in size of pancreatic head mass. 3.  Aortic Atherosclerosis (ICD10-I70.0). 4. Similar appearance of mild wall thickening of the appendix which contains an appendicolith.    05/01/2017 Imaging    CT C/A/P: Stable disease in the lungs, no progression of disease in the abdomen.      CANCER STAGING: Cancer Staging Rectal cancer ([place]) Staging form: Colon and Rectum, AJCC 7th Edition - Clinical: Stage IIIB (T3, N1, M0) - Signed by [name], MD on 02/04/2011 - Pathologic stage from 11/17/2014: Stage IVA (M1a) - Signed by Baird Cancer, PA-C on 09/14/2015    INTERVAL HISTORY:  Mr. Senske 68 y.o. male returns for routine follow-up and consideration for next cycle of chemotherapy. He is here today alone. He states that he has a new rash on his left buttocks, using desitin. He states that he has blood in his stool a few  times since his last visit, notices it more when he has been constipated. Denies any nausea, vomiting, or diarrhea. Denies any new pains. Had not noticed any recent bleeding such as epistaxis, hematuria or hematochezia. Denies recent chest pain on exertion, shortness of breath on minimal exertion, pre-syncopal episodes, or palpitations. Denies any numbness or tingling in hands or feet. Denies any recent fevers, infections, or recent hospitalizations. Patient reports appetite at 75% and energy level at 75%.   REVIEW OF SYSTEMS:  Review of Systems  Gastrointestinal: Positive for blood in stool.  Skin: Positive for rash.     PAST MEDICAL/SURGICAL HISTORY:  Past Medical History:  Diagnosis Date   Chronic anticoagulation    Colon cancer ([place]) 07/24/2010   rectal ca, inv adenocarcinoma   Depression 04/01/2011   Diabetes mellitus without complication ([place])    DVT (deep venous thrombosis) ([place]) 05/09/2011   GERD (gastroesophageal reflux disease)    High output ileostomy ([place]) 05/21/2011   History of kidney stones    HTN (hypertension)    Hx of radiation therapy 09/02/10 to 10/14/10   pelvis   Lung metastasis ([facility])    Neuropathy  Peripheral vascular disease ([place])    dvt's,pe   Pneumonia    hx x3   Pulmonary embolism ([place])    Rectal cancer ([place]) 01/15/2011   S/P radiation and concurrent 5-FU continuous infusion from 09/09/10- 10/10/10.  S/P proctectomy with colorectal anastomosis and diverting loop ileostomy on 11/14/10 at [facility] by Dr. Harlon Ditty. Pathology reveals a pT3b N1 with 3/20 lymph nodes.     Past Surgical History:  Procedure Laterality Date   COLON SURGERY  11/14/2010   proctectomy with colorectal anastomosis and diverting loop ileostomy (temporary planned)   COLONOSCOPY  07/2010   proximal rectal apple core mass 10-14cm from anal verge (adenocarcinoma), 2-3cm distal rectal carpet polyp s/p piecemeal snare polypectomy (adenoma)   COLONOSCOPY  04/21/2012   RMR:  Friable,fibrotic appearing colorectal anastomosis producing some luminal narrowing-not felt to be critical. path: focal erosion with slight inflammation and hyperemia. SURVEILLANCE DUE DEC 2015   COLONOSCOPY N/A 11/24/2013   Dr. Rourk:somewhat fibrotic/friable anastomotic mucosal-status post biopsy (narrowing not felt to be clinically significant) Single colonic diverticulum. benign polypoid rectal mucosa   colostomy reversal  april 2013   ESOPHAGOGASTRODUODENOSCOPY  07/2010   RMR: schatki ring s/p dilation, small hh, SB bx benign   ESOPHAGOGASTRODUODENOSCOPY N/A 09/04/2015   Procedure: ESOPHAGOGASTRODUODENOSCOPY (EGD);  Surgeon: Daneil Dolin, MD;  Location: [facility];  Service: Endoscopy;  Laterality: N/A;  730   EUS  08/2010   Dr. Owens Loffler. uT3N0 circumferential, nearly obstruction rectosigmoid adenocarcinoma, distal edge 12cm from anal verge   EUS N/A 06/19/2016   Procedure: UPPER ENDOSCOPIC ULTRASOUND (EUS) RADIAL;  Surgeon: Milus Banister, MD;  Location: [facility] END[facility];  Service: Endoscopy;  Laterality: N/A;   HERNIA REPAIR     abd hernia repair   IVC filter     ivc filter     port a cath placement     PORT-A-CATH REMOVAL  09/24/2011   Procedure: REMOVAL PORT-A-CATH;  Surgeon: Donato Heinz, MD;  Location: [facility] ORS;  Service: General;  Laterality: N/A;  Minor Room   PORTACATH PLACEMENT Right 07/07/2016   Procedure: INSERTION PORT-A-CATH;  Surgeon: Aviva Signs, MD;  Location: [facility] ORS;  Service: General;  Laterality: Right;   VIDEO ASSISTED THORACOSCOPY (VATS)/WEDGE RESECTION Right 11/15/2014   Procedure: VIDEO ASSISTED THORACOSCOPY (VATS)/LUNG RESECTION WITH RIGHT LINGULECTOMY;  Surgeon: [name], MD;  Location: [place];  Service: Thoracic;  Laterality: Right;   VIDEO BRONCHOSCOPY N/A 11/15/2014   Procedure: VIDEO BRONCHOSCOPY;  Surgeon: [name], MD;  Location: [facility];  Service: Thoracic;  Laterality: N/A;     SOCIAL HISTORY:  Social History    Socioeconomic History   Marital status: Married    Spouse name: Not on file   Number of children: 2   Years of education: Not on file   Highest education level: Not on file  Occupational History   Occupation: self-employed Regulatory affairs officer, currently not working    Fish farm manager: [employer] resource strain: Not on file   Food insecurity:    Worry: Not on file    Inability: Not on file   Transportation needs:    Medical: Not on file    Non-medical: Not on file  Tobacco Use   Smoking status: Never Smoker   Smokeless tobacco: Never Used  Substance and Sexual Activity   Alcohol use: No   Drug use: No   Sexual activity: Yes    Birth control/protection: None    Comment: married  Lifestyle   Physical activity:    Days per week: Not on file    Minutes per session: Not on file   Stress: Not on file  Relationships   Social connections:    Talks on phone: Not on file    Gets together: Not on file    Attends religious service: Not on file    Active member of club or organization: Not on file    Attends meetings of clubs or organizations: Not on file    Relationship status: Not on file   Intimate partner violence:    Fear of current or ex partner: Not on file    Emotionally abused: Not on file    Physically abused: Not on file    Forced sexual activity: Not on file  Other Topics Concern   Not on file  Social History Narrative   Not on file    FAMILY HISTORY:  Family History  Problem Relation Age of Onset   Cancer Brother        throat   Cancer Brother        prostate   Colon cancer Neg Hx    Liver disease Neg Hx    Inflammatory bowel disease Neg Hx     CURRENT MEDICATIONS:  Outpatient Encounter Medications as of 08/19/2018  Medication Sig   acetaminophen (TYLENOL) 500 MG tablet Take 1,000 mg by mouth every 6 (six) hours as needed.   capecitabine (XELODA) 500 MG tablet TAKE 3 TABLETS BY MOUTH TWICE  DAILY FOR 14 DAYS IN A ROW FOLLOWED BY A 7 DAY REST PERIOD. TAKE AFTER MEAL   clonazePAM (KLONOPIN) 0.5 MG tablet Take 1 tablet (0.5 mg total) by mouth 2 (two) times daily as needed.   Diphenhyd-Hydrocort-Nystatin (FIRST-DUKES MOUTHWASH) SUSP Use as directed 5 mLs in the mouth or throat 4 (four) times daily.   diphenoxylate-atropine (LOMOTIL) 2.5-0.025 MG tablet Take 1 tablet by mouth 4 (four) times daily as needed for diarrhea or loose stools.   gabapentin (NEURONTIN) 300 MG capsule Take 3 capsules (900 mg total) by mouth 2 (two) times daily.   glimepiride (AMARYL) 2 MG tablet Take 2 mg by mouth daily with breakfast.    HYDROcodone-acetaminophen (NORCO) 5-325 MG tablet Take 1-2 tablets by mouth every 6 (six) hours as needed for moderate pain.   lipase/protease/amylase (CREON) 36000 UNITS CPEP capsule Take 2 capsules (72,000 Units total) by mouth 3 (three) times daily with meals. Take one capsule with snacks   lisinopril (PRINIVIL,ZESTRIL) 10 MG tablet    magnesium oxide (MAG-OX) 400 (241.3 Mg) MG tablet Take 1 tablet (400 mg total) by mouth 2 (two) times daily.   mirtazapine (REMERON) 7.5 MG tablet Take 1 tablet (7.5 mg total) by mouth at bedtime.   omeprazole (PRILOSEC) 20 MG capsule Take 20 mg by mouth 2 (two) times daily before a meal.    ondansetron (ZOFRAN) 8 MG tablet Take 1 tablet (8 mg total) by mouth 2 (two) times daily as needed for refractory nausea / vomiting. Start on day 3 after chemotherapy.   prochlorperazine (COMPAZINE) 10 MG tablet Take 1 tablet (10 mg total) by mouth every 6 (six) hours as needed (Nausea or vomiting).   psyllium (REGULOID) 0.52 g capsule Take 0.52 g by mouth daily.   rivaroxaban (XARELTO) 20 MG TABS tablet Take 1 tablet (20 mg total) by mouth daily with supper.   traZODone (DESYREL) 100 MG tablet Take 100 mg by mouth at bedtime.   Facility-Administered Encounter Medications as of  08/19/2018  Medication   sodium chloride 0.9 % injection 10 mL    sodium chloride flush (NS) 0.9 % injection 10 mL    ALLERGIES:  Allergies  Allergen Reactions   Oxycodone     Blisters, hallucinations   Tramadol     Blisters, hallucinations   Trazodone And Nefazodone Other (See Comments)    hallucinations     PHYSICAL EXAM:  ECOG Performance status: 1  Vitals:   08/19/18 0813  BP: 140/69  Pulse: 73  Temp: (!) 97.3 F (36.3 C)   Filed Weights   08/19/18 0813  Weight: 198 lb 3.2 oz (89.9 kg)    Physical Exam Vitals signs reviewed.  Constitutional:      Appearance: Normal appearance.  Cardiovascular:     Rate and Rhythm: Normal rate and regular rhythm.     Heart sounds: Normal heart sounds.  Pulmonary:     Effort: Pulmonary effort is normal.     Breath sounds: Normal breath sounds.  Abdominal:     General: Bowel sounds are normal. There is no distension.     Palpations: Abdomen is soft. There is no mass.  Musculoskeletal:        General: No swelling.  Skin:    General: Skin is warm.     Findings: Rash present.  Neurological:     Mental Status: He is alert.  Psychiatric:        Mood and Affect: Mood normal.        Behavior: Behavior normal.      LABORATORY DATA:  I have reviewed the labs as listed.  CBC    Component Value Date/Time   WBC 5.1 08/19/2018 0804   RBC 3.79 (L) 08/19/2018 0804   HGB 12.8 (L) 08/19/2018 0804   HGB 11.6 (L) 02/04/2011 1059   HCT 37.9 (L) 08/19/2018 0804   HCT 34.7 (L) 02/04/2011 1059   PLT 168 08/19/2018 0804   PLT 282 02/04/2011 1059   MCV 100.0 08/19/2018 0804   MCV 76.0 (L) 02/04/2011 1059   MCH 33.8 08/19/2018 0804   MCHC 33.8 08/19/2018 0804   RDW 15.5 08/19/2018 0804   RDW 18.5 (H) 02/04/2011 1059   LYMPHSABS 1.6 08/19/2018 0804   LYMPHSABS 1.1 02/04/2011 1059   MONOABS 0.4 08/19/2018 0804   MONOABS 0.5 02/04/2011 1059   EOSABS 0.1 08/19/2018 0804   EOSABS 0.1 02/04/2011 1059   BASOSABS 0.0 08/19/2018 0804   BASOSABS 0.1 02/04/2011 1059   CMP Latest Ref Rng &  Units 08/10/2018 07/13/2018 06/22/2018  Glucose 70 - 99 mg/dL 186(H) 284(H) 347(H)  BUN 8 - 23 mg/dL _0 Creatinine 0.61 - 1.24 mg/dL 0.70 0.72 0.87  Sodium 135 - 145 mmol/L 138 138 138  Potassium 3.5 - 5.1 mmol/L 4.9 3.5 4.4  Chloride 98 - 111 mmol/L 108 109 108  CO2 22 - 32 mmol/L 23 21(L) 23  Calcium 8.9 - 10.3 mg/dL 8.7(L) 8.2(L) 8.5(L)  Total Protein 6.5 - 8.1 g/dL 6.2(L) 5.8(L) 6.0(L)  Total Bilirubin 0.3 - 1.2 mg/dL 1.6(H) 1.8(H) 2.0(H)  Alkaline Phos 38 - 126 U/L 93 78 84  AST 15 - 41 U/L _1 ALT 0 - 44 U/L 17 26 32       DIAGNOSTIC IMAGING:  I have independently reviewed the scans and discussed with the patient.   I have reviewed Venita Lick LPN's note and agree with the documentation.  I personally performed a face-to-face visit, made revisions and my assessment  and plan is as follows.    ASSESSMENT & PLAN:   Rectal cancer 1.  Stage IV rectal cancer with lung and pancreatic metastasis: -Maintenance Xeloda started on 08/07/2017 with bevacizumab, dose reduced to 1500 mg twice daily during cycle 2.  Prior to that he was on 5-FU maintenance. -CT CAP on 07/30/2018 showed no significant progressive metastatic disease.  No significant change in the right upper lobe solid pulmonary nodules.  Stable mixed lytic and sclerotic lower sternal lesion.  Stable malignant pancreatic head mass. -  2.  Peripheral neuropathy: -He is taking gabapentin 900 mg twice daily.  Well controlled.   3.  DVT and PE: -Diagnosed in 2016, status post IVC filter. -He will continue Xarelto 20 mg daily.  No bleeding reported.  4.  Rectal pain: -We will continue hydrocodone 5 mg once or twice a week as needed.    5.  Hypomagnesemia: -Magnesium is 1.8 today.  We will continue magnesium daily.       Orders placed this encounter:  No orders of the defined types were placed in this encounter.     Derek Jack, MD [place] [phone]

## 2018-08-19 NOTE — Assessment & Plan Note (Addendum)
1.  Stage IV rectal cancer with lung and pancreatic metastasis: -Maintenance Xeloda started on 08/07/2017 with bevacizumab, dose reduced to 1500 mg twice daily during cycle 2.  Prior to that he was on 5-FU maintenance. -CT CAP on 07/30/2018 showed no significant progressive metastatic disease.  No significant change in the right upper lobe solid pulmonary nodules.  Stable mixed lytic and sclerotic lower sternal lesion.  Stable malignant pancreatic head mass. - He had slight rectal bleeding when he was constipated.  His Avastin was held last week secondary to elevated urine protein. - We have checked his UA today.  Protein has normalized.  He may proceed with his next cycle today.  He started Xeloda last Wednesday on 08/11/2018. - He will come back in 3 weeks for follow-up.  2.  Peripheral neuropathy: -He is taking gabapentin 900 mg twice daily.  Well controlled.   3.  DVT and PE: -Diagnosed in 2016, status post IVC filter. -He will continue Xarelto 20 mg daily.  No bleeding reported.  4.  Rectal pain: -We will continue hydrocodone 5 mg once or twice a week as needed.    5.  Hypomagnesemia: -Magnesium is 1.8 today.  We will continue magnesium daily.

## 2018-08-19 NOTE — Patient Instructions (Signed)
[facility]  Discharge Instructions:   _______________________________________________________________  Thank you for choosing [facility] to provide your oncology and hematology care.  To afford each patient quality time with our providers, please arrive at least 15 minutes before your scheduled appointment.  You need to re-schedule your appointment if you arrive 10 or more minutes late.  We strive to give you quality time with our providers, and arriving late affects you and other patients whose appointments are after yours.  Also, if you no show three or more times for appointments you may be dismissed from the clinic.  Again, thank you for choosing [facility]. Our hope is that these requests will allow you access to exceptional care and in a timely manner. _______________________________________________________________  If you have questions after your visit, please contact our office at [phone] between the hours of 8:30 a.m. and 5:00 p.m. Voicemails left after 4:30 p.m. will not be returned until the following business day. _______________________________________________________________  For prescription refill requests, have your pharmacy contact our office. _______________________________________________________________  Recommendations made by the consultant and any test results will be sent to your referring physician. _______________________________________________________________ 

## 2018-08-19 NOTE — Progress Notes (Signed)
Patient seen by Dr. Katragadda with lab review and ok to treat today verbal order.   Patient tolerated chemotherapy with no complaints voiced.  Port site clean and dry with no bruising or swelling noted at site.  Good blood return noted before and after administration of chemotherapy.  Band aid applied.  Patient left ambulatory with VSS and no s/s of distress noted.  

## 2018-08-19 NOTE — Patient Instructions (Addendum)
[place] at [facility] Discharge Instructions  You were seen today by Dr. [name]. He went over your recent lab results. He would like you to start taking a stool softener daily. He will see you back in 3 weeks for labs, treatment and follow up.   Thank you for choosing [place] at [facility] to provide your oncology and hematology care.  To afford each patient quality time with our provider, please arrive at least 15 minutes before your scheduled appointment time.   If you have a lab appointment with the [facility] please come in thru the  Main Entrance and check in at the main information desk  You need to re-schedule your appointment should you arrive 10 or more minutes late.  We strive to give you quality time with our providers, and arriving late affects you and other patients whose appointments are after yours.  Also, if you no show three or more times for appointments you may be dismissed from the clinic at the providers discretion.     Again, thank you for choosing [facility].  Our hope is that these requests will decrease the amount of time that you wait before being seen by our physicians.       _____________________________________________________________  Should you have questions after your visit to [facility], please contact our office at [phone] between the hours of 8:00 a.m. and 4:30 p.m.  Voicemails left after 4:00 p.m. will not be returned until the following business day.  For prescription refill requests, have your pharmacy contact our office and allow 72 hours.    Cancer Center Support Programs:   > Cancer Support Group  2nd Tuesday of the month 1pm-2pm, Journey Room

## 2018-08-23 ENCOUNTER — Other Ambulatory Visit (HOSPITAL_COMMUNITY): Payer: Self-pay | Admitting: *Deleted

## 2018-08-23 DIAGNOSIS — C7800 Secondary malignant neoplasm of unspecified lung: Principal | ICD-10-CM

## 2018-08-23 DIAGNOSIS — C20 Malignant neoplasm of rectum: Secondary | ICD-10-CM

## 2018-08-23 MED ORDER — HYDROCODONE-ACETAMINOPHEN 5-325 MG PO TABS: 1.0000 | ORAL_TABLET | Freq: Four times a day (QID) | ORAL | 0 refills | Status: DC | PRN

## 2018-08-25 DIAGNOSIS — Z299 Encounter for prophylactic measures, unspecified: Secondary | ICD-10-CM | POA: Diagnosis not present

## 2018-08-25 DIAGNOSIS — Z6824 Body mass index (BMI) 24.0-24.9, adult: Secondary | ICD-10-CM | POA: Diagnosis not present

## 2018-08-25 DIAGNOSIS — I10 Essential (primary) hypertension: Secondary | ICD-10-CM | POA: Diagnosis not present

## 2018-08-25 DIAGNOSIS — I2699 Other pulmonary embolism without acute cor pulmonale: Secondary | ICD-10-CM | POA: Diagnosis not present

## 2018-08-25 DIAGNOSIS — C799 Secondary malignant neoplasm of unspecified site: Secondary | ICD-10-CM | POA: Diagnosis not present

## 2018-08-25 DIAGNOSIS — E1165 Type 2 diabetes mellitus with hyperglycemia: Secondary | ICD-10-CM | POA: Diagnosis not present

## 2018-08-25 DIAGNOSIS — C20 Malignant neoplasm of rectum: Secondary | ICD-10-CM | POA: Diagnosis not present

## 2018-08-26 MED FILL — XELODA 500 MG TABLET: 500 | 21 days supply | Qty: 84 | Fill #0

## 2018-09-10 ENCOUNTER — Other Ambulatory Visit: Payer: Self-pay

## 2018-09-10 ENCOUNTER — Ambulatory Visit (HOSPITAL_COMMUNITY)
Admission: RE | Admit: 2018-09-10 | Discharge: 2018-09-10 | Disposition: A | Discharge status: Home / Self Care | Payer: Medicare Other | Source: Ambulatory Visit | Attending: Hematology | Admitting: Hematology

## 2018-09-10 ENCOUNTER — Inpatient Hospital Stay (HOSPITAL_COMMUNITY): Payer: Medicare Other

## 2018-09-10 ENCOUNTER — Encounter (HOSPITAL_COMMUNITY): Payer: Self-pay | Admitting: Hematology

## 2018-09-10 ENCOUNTER — Other Ambulatory Visit (HOSPITAL_COMMUNITY): Payer: Self-pay | Admitting: *Deleted

## 2018-09-10 ENCOUNTER — Inpatient Hospital Stay (HOSPITAL_COMMUNITY): Payer: Medicare Other | Attending: Hematology

## 2018-09-10 ENCOUNTER — Other Ambulatory Visit (HOSPITAL_COMMUNITY): Payer: Self-pay | Admitting: Hematology

## 2018-09-10 ENCOUNTER — Inpatient Hospital Stay (HOSPITAL_BASED_OUTPATIENT_CLINIC_OR_DEPARTMENT_OTHER): Payer: Medicare Other | Admitting: Hematology

## 2018-09-10 ENCOUNTER — Encounter (HOSPITAL_COMMUNITY): Payer: Self-pay

## 2018-09-10 VITALS — BP 149/87 | HR 77 | Temp 97.3°F | Resp 18 | Wt 197.8 lb

## 2018-09-10 DIAGNOSIS — Z79899 Other long term (current) drug therapy: Secondary | ICD-10-CM | POA: Diagnosis not present

## 2018-09-10 DIAGNOSIS — C20 Malignant neoplasm of rectum: Secondary | ICD-10-CM

## 2018-09-10 DIAGNOSIS — R1011 Right upper quadrant pain: Secondary | ICD-10-CM

## 2018-09-10 DIAGNOSIS — C7889 Secondary malignant neoplasm of other digestive organs: Secondary | ICD-10-CM

## 2018-09-10 DIAGNOSIS — Z5112 Encounter for antineoplastic immunotherapy: Secondary | ICD-10-CM | POA: Insufficient documentation

## 2018-09-10 DIAGNOSIS — C7800 Secondary malignant neoplasm of unspecified lung: Secondary | ICD-10-CM

## 2018-09-10 DIAGNOSIS — K8689 Other specified diseases of pancreas: Secondary | ICD-10-CM | POA: Diagnosis not present

## 2018-09-10 DIAGNOSIS — Z85048 Personal history of other malignant neoplasm of rectum, rectosigmoid junction, and anus: Secondary | ICD-10-CM | POA: Diagnosis not present

## 2018-09-10 DIAGNOSIS — K838 Other specified diseases of biliary tract: Secondary | ICD-10-CM | POA: Diagnosis not present

## 2018-09-10 LAB — COMPREHENSIVE METABOLIC PANEL
ALT: 332 U/L — ABNORMAL HIGH (ref 0–44)
AST: 290 U/L — ABNORMAL HIGH (ref 15–41)
Albumin: 3.2 g/dL — ABNORMAL LOW (ref 3.5–5.0)
Alkaline Phosphatase: 348 U/L — ABNORMAL HIGH (ref 38–126)
Anion gap: 7 (ref 5–15)
BUN: 11 mg/dL (ref 8–23)
CO2: 23 mmol/L (ref 22–32)
Calcium: 8.5 mg/dL — ABNORMAL LOW (ref 8.9–10.3)
Chloride: 111 mmol/L (ref 98–111)
Creatinine, Ser: 0.61 mg/dL (ref 0.61–1.24)
GFR calc Af Amer: >60 mL/min (ref >60)
GFR calc non Af Amer: >60 mL/min (ref >60)
Glucose, Bld: 193 mg/dL — ABNORMAL HIGH (ref 70–99)
Potassium: 3.5 mmol/L (ref 3.5–5.1)
Sodium: 141 mmol/L (ref 135–145)
Total Bilirubin: 3.4 mg/dL — ABNORMAL HIGH (ref 0.3–1.2)
Total Protein: 5.8 g/dL — ABNORMAL LOW (ref 6.5–8.1)

## 2018-09-10 LAB — CBC WITH DIFFERENTIAL/PLATELET
Abs Immature Granulocytes: 0.01 10*3/uL (ref 0.00–0.07)
Basophils Absolute: 0 10*3/uL (ref 0.0–0.1)
Basophils Relative: 1 %
Eosinophils Absolute: 0.2 10*3/uL (ref 0.0–0.5)
Eosinophils Relative: 4 %
HCT: 38.4 % — ABNORMAL LOW (ref 39.0–52.0)
Hemoglobin: 12.4 g/dL — ABNORMAL LOW (ref 13.0–17.0)
Immature Granulocytes: 0 %
Lymphocytes Relative: 32 %
Lymphs Abs: 1.5 10*3/uL (ref 0.7–4.0)
MCH: 33.1 pg (ref 26.0–34.0)
MCHC: 32.3 g/dL (ref 30.0–36.0)
MCV: 102.4 fL — ABNORMAL HIGH (ref 80.0–100.0)
Monocytes Absolute: 0.5 10*3/uL (ref 0.1–1.0)
Monocytes Relative: 10 %
Neutro Abs: 2.5 10*3/uL (ref 1.7–7.7)
Neutrophils Relative %: 53 %
Platelets: 177 10*3/uL (ref 150–400)
RBC: 3.75 MIL/uL — ABNORMAL LOW (ref 4.22–5.81)
RDW: 15.5 % (ref 11.5–15.5)
WBC: 4.7 10*3/uL (ref 4.0–10.5)
nRBC: 0 % (ref 0.0–0.2)

## 2018-09-10 MED ORDER — HYDROMORPHONE HCL 1 MG/ML IJ SOLN
INTRAMUSCULAR | Status: AC
  Filled 2018-09-10: qty 2

## 2018-09-10 MED ORDER — SODIUM CHLORIDE 0.9 % IV SOLN
Freq: Once | INTRAVENOUS | Status: AC
  Administered 2018-09-10: 09:00:00 via INTRAVENOUS

## 2018-09-10 MED ORDER — HEPARIN SOD (PORK) LOCK FLUSH 100 UNIT/ML IV SOLN
INTRAVENOUS | Status: AC
  Filled 2018-09-10: qty 5

## 2018-09-10 MED ORDER — SODIUM CHLORIDE 0.9% FLUSH
10.0000 mL | INTRAVENOUS | Status: DC | PRN
  Administered 2018-09-10: 10 mL
  Filled 2018-09-10: qty 10

## 2018-09-10 MED ORDER — HYDROMORPHONE HCL 1 MG/ML IJ SOLN
2.0000 mg | Freq: Once | INTRAMUSCULAR | Status: AC
  Administered 2018-09-10: 2 mg via INTRAVENOUS

## 2018-09-10 MED ORDER — GADOBUTROL 1 MMOL/ML IV SOLN
9.0000 mL | Freq: Once | INTRAVENOUS | Status: AC | PRN
  Administered 2018-09-10: 9 mL via INTRAVENOUS

## 2018-09-10 MED ORDER — HYDROMORPHONE HCL 2 MG PO TABS
2.0000 mg | ORAL_TABLET | Freq: Once | ORAL | Status: DC
  Filled 2018-09-10: qty 1

## 2018-09-10 MED ORDER — HEPARIN SOD (PORK) LOCK FLUSH 100 UNIT/ML IV SOLN
500.0000 [IU] | Freq: Once | INTRAVENOUS | Status: AC | PRN
  Administered 2018-09-10: 09:00:00 500 [IU]

## 2018-09-10 MED ORDER — SODIUM CHLORIDE 0.9 % IV SOLN
700.0000 mg | Freq: Once | INTRAVENOUS | Status: DC
  Filled 2018-09-10: qty 28

## 2018-09-10 NOTE — Progress Notes (Signed)
Pt presents today for Avastin and appointment with Dr. [name]. VSS. No complaints of any pain since the last visit.   Message received to proceed with treatment per Dr. [name].   Reported CMP to Dr. [name]. VO received to hold treatment today. VO received to perform a stat u/s on upper right abdomen. Scheduling notified.    Discharged from clinic ambulatory. F/U with [facility] as scheduled.   13:50 Pt presents to see Dr. [name] post U/S. VO received to administer Dilaudid 2mg  IV. Pt to have a MRCP . Pt accessed with Power Port. Blood return noted.

## 2018-09-10 NOTE — Progress Notes (Signed)
[address] [place], [place] [address]   CLINIC:  Medical Oncology/Hematology  PCP:  [name], MD [place] [id number] [phone]   REASON FOR VISIT:  Follow-up for rectal cancer  CURRENT THERAPY:Xeloda tabs 2 weeks on 1 week off and Bevacizumab      BRIEF ONCOLOGIC HISTORY:    Rectal cancer ([place])   07/24/2010 Initial Diagnosis    Invasive adenocarcinoma of rectum    09/09/2010 Concurrent Chemotherapy    S/P radiation and concurrent 5-FU continuous infusion from 09/09/10- 10/10/10.    11/14/2010 Surgery    S/P proctectomy with colorectal anastomosis and diverting loop ileostomy on 11/14/10 at [facility] by Dr. Harlon Ditty. Pathology reveals a pT3b N1 with 3/20 lymph nodes.    02/05/2011 - 07/14/2011 Chemotherapy    FOLFOX    08/18/2011 Surgery    Approximate date of surgery- [place] by Dr. Harlon Ditty     Remission       11/24/2013 Survivorship    Colonoscopy- somewhat fibrotic/friable anastomotic mucosal-status post biopsy (narrowing not felt to be clinically significant).  Negative pathology for malignancy    09/12/2014 Imaging    DVT in the left femoral venous system, left common iliac vein, IVC, and within the IVC filter Right lower extremity venography confirms chronic occlusion of the femoral venous system with collateralization. The right iliac venous system is patent and do    09/25/2014 PET scan    The right middle lobe pulmonary nodule is hypermetabolic, favored to represent a primary bronchogenic carcinoma.Equivocal mediastinal nodes, similar to surrounding blood pool. Bilateral adrenal hypermetabolism, felt to be physiologic    10/24/2014 Pathology Results    Lung, needle/core biopsy(ies), RML - ADENOCARCINOMA, SEE COMMENT metastatic adenocarcinoma of a colorectal primary    10/24/2014 Relapse/Recurrence       11/16/2014 Definitive Surgery    Bronchoscopy, right video-assisted thoracoscopy, wedge resection of right  middle lobe by Dr. Servando Snare    11/16/2014 Pathology Results    Lung, wedge biopsy/resection, right lingula and small portion of middle lobe - METASTATIC ADENOCARCINOMA, CONSISTENT WITH COLORECTAL PRIMARY, SPANNING 2.0 CM. - THE SURGICAL RESECTION MARGINS ARE NEGATIVE FOR ADENOCARCINOMA.    11/16/2014 Remission    THE SURGICAL RESECTION MARGINS ARE NEGATIVE FOR ADENOCARCINOMA.    03/07/2015 Imaging    CT CAP- Interval resection of right middle lobe metastasis. No acute process or evidence of metastatic disease in the chest, abdomen or pelvis. Improved right upper lobe reticular nodular opacities are favored to represent resolving infection.    09/14/2015 Imaging    CT CAP- No findings of recurrent malignancy. No recurrence along the wedge resection site of the right middle lobe. Right anterior abdominal wall focal hernia containing a knuckle of small bowel without complicating feature.    06/13/2016 Imaging    CT CAP- 1. New right-sided pleural metastasis/mass. Other smaller right-sided pulmonary nodules which are all pleural-based and most likely represent pleural metastasis. 2. No convincing evidence of abdominopelvic nodal metastasis. 3. Constellation of findings, including pancreatic atrophy, duct dilatation, and pancreatic head soft tissue fullness which are highly suspicious for pancreatic adenocarcinoma. Metastatic disease felt much less likely. Consider endoscopic ultrasound sampling or ERCP. Cannot exclude superimposed acute pancreatitis. 4. New enlargement of the appendiceal tip with subtle surrounding edema. Cannot exclude early or mild appendicitis. 5. Coronary artery atherosclerosis. Aortic atherosclerosis. 6. 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    06/13/2016 Progression    CT  scan demonstrates progression of disease    06/19/2016 Procedure    EUS with FNA by Dr. Ardis Hughs    06/20/2016 Pathology Results    FINE NEEDLE ASPIRATION, ENDOSCOPIC,  PANCREAS UNCINATE AREA(SPECIMEN 1 OF 1 COLLECTED 06/19/16): MALIGNANT CELLS CONSISTENT WITH METASTATIC ADENOCARCINOMA.    06/25/2016 PET scan    1. Two pleural-based nodules in the right hemithorax are hypermetabolic. Metastatic disease is a distinct consideration. The scattered pulmonary parenchymal nodule seen on previous diagnostic CT imaging are below the threshold for reliable resolution on PET imaging. 2. Hypermetabolic lesion pancreatic head, consistent with neoplasm. As noted on prior CT, adenocarcinoma is any consideration. No evidence for hypermetabolic abdominal lymphadenopathy. Mottled uptake noted in the liver, but no discrete hepatic metastases are evident on PET imaging. 3. Appendix remains distended up to 0.9-10 mm diameter with a stone towards the tip in subtle periappendiceal edema/inflammation. The appendix is hypermetabolic along its length. Imaging features are relatively stable in the 12 day interval since prior CT scan. While appendicitis is a consideration, the relative stability over 12 days would be unusual for that etiology. Continued close follow-up recommended.    06/26/2016 Pathology Results    Not enough tissue for foundationONE or K-ras testing.    07/07/2016 Procedure    Port placed by Dr. Arnoldo Morale    07/08/2016 -  Chemotherapy    The patient had pegfilgrastim (NEULASTA) injection 6 mg, 6 mg, Subcutaneous,  Once, 0 of 4 cycles  ondansetron (ZOFRAN) IVPB 8 mg, 8 mg, Intravenous,  Once, 0 of 4 cycles  leucovorin 804 mg in dextrose 5 % 250 mL infusion, 400 mg/m2, Intravenous,  Once, 0 of 4 cycles  oxaliplatin (ELOXATIN) 170 mg in dextrose 5 % 500 mL chemo infusion, 85 mg/m2, Intravenous,  Once, 0 of 4 cycles  fluorouracil (ADRUCIL) 4,800 mg in sodium chloride 0.9 % 150 mL chemo infusion, 2,400 mg/m2 = 4,800 mg, Intravenous, 1 Day/Dose, 0 of 4 cycles  fluorouracil (ADRUCIL) chemo injection 800 mg, 400 mg/m2, Intravenous,  Once, 0 of 4 cycles   pegfilgrastim (NEULASTA) injection 6 mg, 6 mg, Subcutaneous,  Once, 2 of 2 cycles  ondansetron (ZOFRAN) IVPB 8 mg, 8 mg, Intravenous,  Once, 12 of 12 cycles  leucovorin 660 mg in dextrose 5 % 250 mL infusion, 660 mg (original dose ), Intravenous,  Once, 12 of 12 cycles Dose modification: 660 mg (Cycle 1)  oxaliplatin (ELOXATIN) 140 mg in dextrose 5 % 500 mL chemo infusion, 140 mg (original dose ), Intravenous,  Once, 12 of 12 cycles Dose modification: 140 mg (Cycle 1), 119 mg (85 % of original dose 140 mg, Cycle 8, Reason: Provider Judgment)  fluorouracil (ADRUCIL) 3,850 mg in sodium chloride 0.9 % 150 mL chemo infusion, 3,850 mg (original dose ), Intravenous, 1 Day/Dose, 12 of 12 cycles Dose modification: 3,850 mg (Cycle 1)  fluorouracil (ADRUCIL) chemo injection 700 mg, 700 mg (original dose ), Intravenous,  Once, 12 of 12 cycles Dose modification: 700 mg (Cycle 1)  palonosetron (ALOXI) injection 0.25 mg, 0.25 mg, Intravenous,  Once, 7 of 12 cycles Administration: 0.25 mg (09/30/2016)  bevacizumab (AVASTIN) 475 mg in sodium chloride 0.9 % 100 mL chemo infusion, 5 mg/kg = 475 mg, Intravenous,  Once, 6 of 11 cycles Administration: 500 mg (09/30/2016)  leucovorin 880 mg in dextrose 5 % 250 mL infusion, 400 mg/m2 = 880 mg, Intravenous,  Once, 7 of 12 cycles Administration: 880 mg (09/30/2016)  oxaliplatin (ELOXATIN) 185 mg in dextrose 5 % 500 mL chemo infusion,  85 mg/m2 = 185 mg, Intravenous,  Once, 7 of 12 cycles Administration: 185 mg (09/30/2016)  fluorouracil (ADRUCIL) chemo injection 900 mg, 400 mg/m2 = 900 mg, Intravenous,  Once, 7 of 12 cycles Administration: 900 mg (09/30/2016)  fluorouracil (ADRUCIL) 5,300 mg in sodium chloride 0.9 % 144 mL chemo infusion, 2,400 mg/m2 = 5,300 mg, Intravenous, 1 Day/Dose, 7 of 12 cycles Administration: 5,300 mg (09/30/2016)  for chemotherapy treatment.      08/27/2016 Imaging    CT CAP- 1. Pulmonary metastatic disease is stable. 2. Pancreatic  head mass, grossly stable, with progressive atrophy of the body and tail of the pancreas. Stable peripancreatic lymph node. 3. Mild circumferential rectal wall thickening, stable. 4. Aortic atherosclerosis (ICD10-170.0). Coronary artery calcification. 5. Hyperattenuating lesion off the lower pole left kidney, stable, too small to characterize. Continued attention on followup exams is warranted. 6. Persistent wall thickening and mild dilatation of the proximal appendix, of uncertain etiology. Continued attention on followup exams is warranted.    11/06/2016 Imaging    CT C/A/P: IMPRESSION: 1. Stable pulmonary metastatic disease. 2. Similar appearing pancreatic head mass. Progressed atrophy of the pancreatic body and tail. Stable peripancreatic lymph node. 3. Stable hypoattenuating lesion partially exophytic off the inferior pole of the left kidney. 4. Aortic atherosclerosis. 5. Persistent mild wall thickening and mild dilatation of the appendix.     01/30/2017 Imaging    Ct C/A/P: IMPRESSION: 1. Stable pulmonary metastatic disease. 2. Stable to slight increase in size of pancreatic head mass. 3.  Aortic Atherosclerosis (ICD10-I70.0). 4. Similar appearance of mild wall thickening of the appendix which contains an appendicolith.    05/01/2017 Imaging    CT C/A/P: Stable disease in the lungs, no progression of disease in the abdomen.      CANCER STAGING: Cancer Staging Rectal cancer ([place]) Staging form: Colon and Rectum, AJCC 7th Edition - Clinical: Stage IIIB (T3, N1, M0) - Signed by [name], MD on 02/04/2011 - Pathologic stage from 11/17/2014: Stage IVA (M1a) - Signed by Baird Cancer, PA-C on 09/14/2015    INTERVAL HISTORY:  [name] 68 y.o. male returns for routine follow-up and consideration for next cycle of chemotherapy. He is here today alone. He states that he still has numbness in his hands and feet that is the same with no change. Denies any  nausea, vomiting, or diarrhea. Denies any new pains. Had not noticed any recent bleeding such as epistaxis, hematuria or hematochezia. Denies recent chest pain on exertion, shortness of breath on minimal exertion, pre-syncopal episodes, or palpitations.   Denies any recent fevers, infections, or recent hospitalizations. Patient reports appetite at 100% and energy level at 75%.      REVIEW OF SYSTEMS:  Review of Systems  Neurological: Positive for numbness.     PAST MEDICAL/SURGICAL HISTORY:  Past Medical History:  Diagnosis Date  . Chronic anticoagulation   . Colon cancer ([place]) 07/24/2010   rectal ca, inv adenocarcinoma  . Depression 04/01/2011  . Diabetes mellitus without complication ([place])   . DVT (deep venous thrombosis) ([place]) 05/09/2011  . GERD (gastroesophageal reflux disease)   . High output ileostomy ([place]) 05/21/2011  . History of kidney stones   . HTN (hypertension)   . Hx of radiation therapy 09/02/10 to 10/14/10   pelvis  . Lung metastasis ([place])   . Neuropathy   . Peripheral vascular disease ([place])    dvt's,pe  . Pneumonia    hx x3  . Pulmonary embolism ([place])   .  Rectal cancer ([place]) 01/15/2011   S/P radiation and concurrent 5-FU continuous infusion from 09/09/10- 10/10/10.  S/P proctectomy with colorectal anastomosis and diverting loop ileostomy on 11/14/10 at [facility] by Dr. Harlon Ditty. Pathology reveals a pT3b N1 with 3/20 lymph nodes.     Past Surgical History:  Procedure Laterality Date  . COLON SURGERY  11/14/2010   proctectomy with colorectal anastomosis and diverting loop ileostomy (temporary planned)  . COLONOSCOPY  07/2010   proximal rectal apple core mass 10-14cm from anal verge (adenocarcinoma), 2-3cm distal rectal carpet polyp s/p piecemeal snare polypectomy (adenoma)  . COLONOSCOPY  04/21/2012   RMR: Friable,fibrotic appearing colorectal anastomosis producing some luminal narrowing-not felt to be critical. path: focal erosion with slight inflammation and  hyperemia. SURVEILLANCE DUE DEC 2015  . COLONOSCOPY N/A 11/24/2013   Dr. Rourk:somewhat fibrotic/friable anastomotic mucosal-status post biopsy (narrowing not felt to be clinically significant) Single colonic diverticulum. benign polypoid rectal mucosa  . colostomy reversal  april 2013  . ESOPHAGOGASTRODUODENOSCOPY  07/2010   RMR: schatki ring s/p dilation, small hh, SB bx benign  . ESOPHAGOGASTRODUODENOSCOPY N/A 09/04/2015   Procedure: ESOPHAGOGASTRODUODENOSCOPY (EGD);  Surgeon: Daneil Dolin, MD;  Location: [facility];  Service: Endoscopy;  Laterality: N/A;  730  . EUS  08/2010   Dr. Owens Loffler. uT3N0 circumferential, nearly obstruction rectosigmoid adenocarcinoma, distal edge 12cm from anal verge  . EUS N/A 06/19/2016   Procedure: UPPER ENDOSCOPIC ULTRASOUND (EUS) RADIAL;  Surgeon: Milus Banister, MD;  Location: [facility] ENDOSCOPY;  Service: Endoscopy;  Laterality: N/A;  . HERNIA REPAIR     abd hernia repair  . IVC filter    . ivc filter    . port a cath placement    . PORT-A-CATH REMOVAL  09/24/2011   Procedure: REMOVAL PORT-A-CATH;  Surgeon: Donato Heinz, MD;  Location: [facility] ORS;  Service: General;  Laterality: N/A;  Minor Room  . PORTACATH PLACEMENT Right 07/07/2016   Procedure: INSERTION PORT-A-CATH;  Surgeon: Aviva Signs, MD;  Location: [facility] ORS;  Service: General;  Laterality: Right;  Marland Kitchen VIDEO ASSISTED THORACOSCOPY (VATS)/WEDGE RESECTION Right 11/15/2014   Procedure: VIDEO ASSISTED THORACOSCOPY (VATS)/LUNG RESECTION WITH RIGHT LINGULECTOMY;  Surgeon: [name], MD;  Location: [place];  Service: Thoracic;  Laterality: Right;  Marland Kitchen VIDEO BRONCHOSCOPY N/A 11/15/2014   Procedure: VIDEO BRONCHOSCOPY;  Surgeon: [name], MD;  Location: [facility] OR;  Service: Thoracic;  Laterality: N/A;     SOCIAL HISTORY:  Social History   Socioeconomic History  . Marital status: Married    Spouse name: Not on file  . Number of children: 2  . Years of education: Not on file  . Highest education  level: Not on file  Occupational History  . Occupation: self-employed Regulatory affairs officer, currently not working    Fish farm manager: SELF EMPLOYED  Social Needs  . Financial resource strain: Not on file  . Food insecurity:    Worry: Not on file    Inability: Not on file  . Transportation needs:    Medical: Not on file    Non-medical: Not on file  Tobacco Use  . Smoking status: Never Smoker  . Smokeless tobacco: Never Used  Substance and Sexual Activity  . Alcohol use: No  . Drug use: No  . Sexual activity: Yes    Birth control/protection: None    Comment: married  Lifestyle  . Physical activity:    Days per week: Not on file    Minutes per session: Not on file  .  Stress: Not on file  Relationships  . Social connections:    Talks on phone: Not on file    Gets together: Not on file    Attends religious service: Not on file    Active member of club or organization: Not on file    Attends meetings of clubs or organizations: Not on file    Relationship status: Not on file  . Intimate partner violence:    Fear of current or ex partner: Not on file    Emotionally abused: Not on file    Physically abused: Not on file    Forced sexual activity: Not on file  Other Topics Concern  . Not on file  Social History Narrative  . Not on file    FAMILY HISTORY:  Family History  Problem Relation Age of Onset  . Cancer Brother        throat  . Cancer Brother        prostate  . Colon cancer Neg Hx   . Liver disease Neg Hx   . Inflammatory bowel disease Neg Hx     CURRENT MEDICATIONS:  Outpatient Encounter Medications as of 09/10/2018  Medication Sig  . acetaminophen (TYLENOL) 500 MG tablet Take 1,000 mg by mouth every 6 (six) hours as needed.  . capecitabine (XELODA) 500 MG tablet TAKE 3 TABLETS BY MOUTH TWICE DAILY FOR 14 DAYS IN A ROW FOLLOWED BY A 7 DAY REST PERIOD. TAKE AFTER MEAL  . clonazePAM (KLONOPIN) 0.5 MG tablet Take 1 tablet (0.5 mg total) by mouth 2 (two) times  daily as needed.  . Diphenhyd-Hydrocort-Nystatin (FIRST-DUKES MOUTHWASH) SUSP Use as directed 5 mLs in the mouth or throat 4 (four) times daily.  . diphenoxylate-atropine (LOMOTIL) 2.5-0.025 MG tablet Take 1 tablet by mouth 4 (four) times daily as needed for diarrhea or loose stools.  . gabapentin (NEURONTIN) 300 MG capsule Take 3 capsules (900 mg total) by mouth 2 (two) times daily.  Marland Kitchen glimepiride (AMARYL) 2 MG tablet Take 2 mg by mouth daily with breakfast.   . HYDROcodone-acetaminophen (NORCO) 5-325 MG tablet Take 1-2 tablets by mouth every 6 (six) hours as needed for moderate pain.  Marland Kitchen lipase/protease/amylase (CREON) 36000 UNITS CPEP capsule Take 2 capsules (72,000 Units total) by mouth 3 (three) times daily with meals. Take one capsule with snacks  . lisinopril (PRINIVIL,ZESTRIL) 10 MG tablet   . magnesium oxide (MAG-OX) 400 (241.3 Mg) MG tablet Take 1 tablet (400 mg total) by mouth 2 (two) times daily.  . mirtazapine (REMERON) 7.5 MG tablet Take 1 tablet (7.5 mg total) by mouth at bedtime.  Marland Kitchen omeprazole (PRILOSEC) 20 MG capsule Take 20 mg by mouth 2 (two) times daily before a meal.   . ondansetron (ZOFRAN) 8 MG tablet Take 1 tablet (8 mg total) by mouth 2 (two) times daily as needed for refractory nausea / vomiting. Start on day 3 after chemotherapy.  . prochlorperazine (COMPAZINE) 10 MG tablet Take 1 tablet (10 mg total) by mouth every 6 (six) hours as needed (Nausea or vomiting).  . psyllium (REGULOID) 0.52 g capsule Take 0.52 g by mouth daily.  . rivaroxaban (XARELTO) 20 MG TABS tablet Take 1 tablet (20 mg total) by mouth daily with supper.  . traZODone (DESYREL) 100 MG tablet Take 100 mg by mouth at bedtime.   Facility-Administered Encounter Medications as of 09/10/2018  Medication  . [COMPLETED] HYDROmorphone (DILAUDID) injection 2 mg  . HYDROmorphone (DILAUDID) tablet 2 mg  . sodium chloride 0.9 % injection  10 mL  . sodium chloride flush (NS) 0.9 % injection 10 mL    ALLERGIES:   Allergies  Allergen Reactions  . Oxycodone     Blisters, hallucinations  . Tramadol     Blisters, hallucinations  . Trazodone And Nefazodone Other (See Comments)    hallucinations     PHYSICAL EXAM:  ECOG Performance status: 1  Vitals:   09/10/18 0821  BP: (!) 149/87  Pulse: 77  Resp: 18  Temp: (!) 97.3 F (36.3 C)  SpO2: 100%   Filed Weights   09/10/18 0821  Weight: 197 lb 12.8 oz (89.7 kg)    Physical Exam Vitals signs reviewed.  Constitutional:      Appearance: Normal appearance.  Cardiovascular:     Rate and Rhythm: Normal rate and regular rhythm.     Heart sounds: Normal heart sounds.  Pulmonary:     Effort: Pulmonary effort is normal.     Breath sounds: Normal breath sounds.  Abdominal:     Palpations: Abdomen is soft.     Tenderness: There is abdominal tenderness.  Musculoskeletal:        General: No swelling.  Skin:    General: Skin is warm.  Neurological:     General: No focal deficit present.     Mental Status: He is alert and oriented to person, place, and time.  Psychiatric:        Mood and Affect: Mood normal.        Behavior: Behavior normal.      LABORATORY DATA:  I have reviewed the labs as listed.  CBC    Component Value Date/Time   WBC 4.7 09/10/2018 0812   RBC 3.75 (L) 09/10/2018 0812   HGB 12.4 (L) 09/10/2018 0812   HGB 11.6 (L) 02/04/2011 1059   HCT 38.4 (L) 09/10/2018 0812   HCT 34.7 (L) 02/04/2011 1059   PLT 177 09/10/2018 0812   PLT 282 02/04/2011 1059   MCV 102.4 (H) 09/10/2018 0812   MCV 76.0 (L) 02/04/2011 1059   MCH 33.1 09/10/2018 0812   MCHC 32.3 09/10/2018 0812   RDW 15.5 09/10/2018 0812   RDW 18.5 (H) 02/04/2011 1059   LYMPHSABS 1.5 09/10/2018 0812   LYMPHSABS 1.1 02/04/2011 1059   MONOABS 0.5 09/10/2018 0812   MONOABS 0.5 02/04/2011 1059   EOSABS 0.2 09/10/2018 0812   EOSABS 0.1 02/04/2011 1059   BASOSABS 0.0 09/10/2018 0812   BASOSABS 0.1 02/04/2011 1059   CMP Latest Ref Rng & Units 09/10/2018  08/19/2018 08/10/2018  Glucose 70 - 99 mg/dL 193(H) 190(H) 186(H)  BUN 8 - 23 mg/dL _0 Creatinine 0.61 - 1.24 mg/dL 0.61 0.66 0.70  Sodium 135 - 145 mmol/L 141 136 138  Potassium 3.5 - 5.1 mmol/L 3.5 3.7 4.9  Chloride 98 - 111 mmol/L 111 106 108  CO2 22 - 32 mmol/L _1 Calcium 8.9 - 10.3 mg/dL 8.5(L) 8.7(L) 8.7(L)  Total Protein 6.5 - 8.1 g/dL 5.8(L) 6.1(L) 6.2(L)  Total Bilirubin 0.3 - 1.2 mg/dL 3.4(H) 1.1 1.6(H)  Alkaline Phos 38 - 126 U/L 348(H) 90 93  AST 15 - 41 U/L 290(H) 39 28  ALT 0 - 44 U/L 332(H) 31 17       DIAGNOSTIC IMAGING:  I have independently reviewed the scans and discussed with the patient.   I have reviewed Venita Lick LPN's note and agree with the documentation.  I personally performed a face-to-face visit, made revisions and my assessment and  plan is as follows.    ASSESSMENT & PLAN:   Rectal cancer 1.  Stage IV rectal cancer with lung and pancreatic metastasis: -Maintenance Xeloda started on 08/07/2017 with bevacizumab, dose reduced to 1500 mg twice daily during cycle 2.  Prior to that he was on 5-FU maintenance. -CT CAP on 07/30/2018 showed no significant progressive metastatic disease.  No significant change in the right upper lobe solid pulmonary nodules.  Stable mixed lytic and sclerotic lower sternal lesion.  Stable malignant pancreatic head mass. - His LFTs are elevated today.  Total bilirubin also gone up to 3.4.  He does not have any right upper quadrant tenderness.  However he has a tender point in the right anterior lower rib cage.  He reportedly got this pain when he laid on the table flat. - We held his treatment today.  We did an ultrasound of the abdomen which showed mildly distended gallbladder.  Mildly dilated CBD, 7 mm diameter, slightly increased from CT abdomen from 07/30/2018.  Because of biliary obstruction was not identified.  Liver parenchyma was normal without any masses.  Hence I have recommended doing MRI of the abdomen with  MRCP with and without contrast to further identify if there is any obstruction. - He had a lot of right anterior lower rib cage pain after he had to lie flat for the ultrasound.  I have given 2 mg of Dilaudid IV in our office.  We have scheduled his MRI later today at 5 PM.  2.  Peripheral neuropathy: -He is taking gabapentin 900 mg twice daily.  Well controlled.   3.  DVT and PE: -Diagnosed in 2016, status post IVC filter. -We will continue Xarelto 20 mg daily.  4.  Rectal pain: -He will continue hydrocodone at bedtime as needed.  5.  Hypomagnesemia: -Magnesium is 1.8 today.  We will continue magnesium daily.    Total time spent is 40 minutes more than 50% of the time spent face-to-face discussing ultrasound results, further plan of action and coordination of care.  Orders placed this encounter:  Orders Placed This Encounter  Procedures  . US Abdomen Limited RUQ  . MR ABDOMEN MRCP W WO CONTAST      Derek Jack, MD [place] [phone]

## 2018-09-10 NOTE — Patient Instructions (Signed)
[facility] at [facility] Discharge Instructions  You were seen today by Dr. Katragadda. He went over your recent lab results. He will see you back in 3 weeks for labs and follow up.   Thank you for choosing [facility] at [facility] to provide your oncology and hematology care.  To afford each patient quality time with our provider, please arrive at least 15 minutes before your scheduled appointment time.   If you have a lab appointment with the Cancer Center please come in thru the  Main Entrance and check in at the main information desk  You need to re-schedule your appointment should you arrive 10 or more minutes late.  We strive to give you quality time with our providers, and arriving late affects you and other patients whose appointments are after yours.  Also, if you no show three or more times for appointments you may be dismissed from the clinic at the providers discretion.     Again, thank you for choosing [facility].  Our hope is that these requests will decrease the amount of time that you wait before being seen by our physicians.       _____________________________________________________________  Should you have questions after your visit to  Cancer Center, please contact our office at [phone] between the hours of 8:00 a.m. and 4:30 p.m.  Voicemails left after 4:00 p.m. will not be returned until the following business day.  For prescription refill requests, have your pharmacy contact our office and allow 72 hours.    Cancer Center Support Programs:   > Cancer Support Group  2nd Tuesday of the month 1pm-2pm, Journey Room    

## 2018-09-10 NOTE — Assessment & Plan Note (Addendum)
1.  Stage IV rectal cancer with lung and pancreatic metastasis: -Maintenance Xeloda started on 08/07/2017 with bevacizumab, dose reduced to 1500 mg twice daily during cycle 2.  Prior to that he was on 5-FU maintenance. -CT CAP on 07/30/2018 showed no significant progressive metastatic disease.  No significant change in the right upper lobe solid pulmonary nodules.  Stable mixed lytic and sclerotic lower sternal lesion.  Stable malignant pancreatic head mass. - His LFTs are elevated today.  Total bilirubin also gone up to 3.4.  He does not have any right upper quadrant tenderness.  However he has a tender point in the right anterior lower rib cage.  He reportedly got this pain when he laid on the table flat. - We held his treatment today.  We did an ultrasound of the abdomen which showed mildly distended gallbladder.  Mildly dilated CBD, 7 mm diameter, slightly increased from CT abdomen from 07/30/2018.  Because of biliary obstruction was not identified.  Liver parenchyma was normal without any masses.  Hence I have recommended doing MRI of the abdomen with MRCP with and without contrast to further identify if there is any obstruction. - He had a lot of right anterior lower rib cage pain after he had to lie flat for the ultrasound.  I have given 2 mg of Dilaudid IV in our office.  We have scheduled his MRI later today at 5 PM.  2.  Peripheral neuropathy: -He is taking gabapentin 900 mg twice daily.  Well controlled.   3.  DVT and PE: -Diagnosed in 2016, status post IVC filter. -We will continue Xarelto 20 mg daily.  4.  Rectal pain: -He will continue hydrocodone at bedtime as needed.  5.  Hypomagnesemia: -Magnesium is 1.8 today.  We will continue magnesium daily.

## 2018-09-13 ENCOUNTER — Encounter (HOSPITAL_COMMUNITY): Payer: Self-pay | Admitting: Hematology

## 2018-09-13 ENCOUNTER — Other Ambulatory Visit: Payer: Self-pay

## 2018-09-13 ENCOUNTER — Inpatient Hospital Stay (HOSPITAL_BASED_OUTPATIENT_CLINIC_OR_DEPARTMENT_OTHER): Payer: Medicare Other | Admitting: Hematology

## 2018-09-13 ENCOUNTER — Telehealth (HOSPITAL_COMMUNITY): Payer: Self-pay | Admitting: *Deleted

## 2018-09-13 DIAGNOSIS — C7800 Secondary malignant neoplasm of unspecified lung: Secondary | ICD-10-CM

## 2018-09-13 DIAGNOSIS — C20 Malignant neoplasm of rectum: Secondary | ICD-10-CM | POA: Diagnosis not present

## 2018-09-13 NOTE — Telephone Encounter (Signed)
I called patient to reassess his pain. His wife reports that Charles [name]'s pain has not gotten any better. [name] could be heard in the back ground and he stated that the pain is still 7-8/10.  He reports a few hours of pain relief when he takes the medication but he hasn't been able to sleep or get comfortable.  The pain is still on the right side around from his chest to his back.    I have reported this to Dr. [name] and patient was made an telephone visit for today.

## 2018-09-13 NOTE — Progress Notes (Signed)
Virtual Visit via Telephone Note  I connected with Charles [name] on 09/13/18 at  2:50 PM EDT by telephone and verified that I am speaking with the correct person using two identifiers.   I discussed the limitations, risks, security and privacy concerns of performing an evaluation and management service by telephone and the availability of in person appointments. I also discussed with the patient that there may be a patient responsible charge related to this service. The patient expressed understanding and agreed to proceed.   History of Present Illness: 1.  Stage IV rectal cancer with lung and pancreatic metastasis - Status post 27 cycles of FOLFOX and bevacizumab from 07/08/2016 through 07/14/2017, followed by maintenance Xeloda and bevacizumab from 08/11/2017. - Elevated LFTs with total bilirubin of 3.4, MRCP showing increase in size of the pancreatic mass measuring 2.1 x 1.3 cm, with secondary mild intrahepatic and extrahepatic ductal dilatation.  CBD measures 8 mm.  2.  Right anterior chest wall pain:  3.  DVT and PE:     Observations/Objective: He denies any nausea or vomiting or diarrhea.  He is taking hydrocodone 2 tablets at bedtime.  He also takes 1 tablet of hydrocodone during daytime.  His pain is controlled but not completely.  His appetite has mildly decreased.  No bleeding was reported.  No fevers or chills.  Assessment and Plan: #1 stage IV rectal cancer: - I have discussed the findings on the MRCP which showed increase in size of the pancreatic mass.  There is also mild intrahepatic and extrahepatic biliary ductal dilatation.  I have talked to Dr.Rehman who will see this patient on Wednesday for possible ERCP and stent placement.  Metal stent will be placed. -I will see him back after the procedure to discuss change in therapy.  2.  Pain: -His anterior right chest wall pain is poorly controlled.  I have asked him to increase hydrocodone to every 6 hours during the daytime.   We will refill his pain medication if he runs out.  Follow Up Instructions: Follow-up after the ERCP.   I discussed the assessment and treatment plan with the patient. The patient was provided an opportunity to ask questions and all were answered. The patient agreed with the plan and demonstrated an understanding of the instructions.   The patient was advised to call back or seek an in-person evaluation if the symptoms worsen or if the condition fails to improve as anticipated.  I provided 15 minutes of non-face-to-face time during this encounter.   Derek Jack, MD

## 2018-09-14 ENCOUNTER — Other Ambulatory Visit (INDEPENDENT_AMBULATORY_CARE_PROVIDER_SITE_OTHER): Payer: Self-pay | Admitting: *Deleted

## 2018-09-14 DIAGNOSIS — C189 Malignant neoplasm of colon, unspecified: Secondary | ICD-10-CM

## 2018-09-14 DIAGNOSIS — K831 Obstruction of bile duct: Secondary | ICD-10-CM

## 2018-09-15 ENCOUNTER — Other Ambulatory Visit: Payer: Self-pay

## 2018-09-15 ENCOUNTER — Encounter (HOSPITAL_COMMUNITY)
Admission: RE | Admit: 2018-09-15 | Discharge: 2018-09-15 | Disposition: A | Discharge status: Home / Self Care | Payer: Medicare Other | Source: Ambulatory Visit | Attending: Internal Medicine | Admitting: Internal Medicine

## 2018-09-15 ENCOUNTER — Encounter (HOSPITAL_COMMUNITY): Payer: Self-pay

## 2018-09-16 ENCOUNTER — Ambulatory Visit (HOSPITAL_COMMUNITY)
Admission: RE | Admit: 2018-09-16 | Discharge: 2018-09-16 | Disposition: A | Discharge status: Home / Self Care | Payer: Medicare Other | Attending: Internal Medicine | Admitting: Internal Medicine

## 2018-09-16 ENCOUNTER — Encounter (HOSPITAL_COMMUNITY)
Admission: RE | Disposition: A | Discharge status: Home / Self Care | Payer: Self-pay | Source: Home / Self Care | Attending: Internal Medicine

## 2018-09-16 ENCOUNTER — Other Ambulatory Visit: Payer: Self-pay

## 2018-09-16 ENCOUNTER — Encounter (HOSPITAL_COMMUNITY): Payer: Self-pay | Admitting: Anesthesiology

## 2018-09-16 ENCOUNTER — Ambulatory Visit (HOSPITAL_COMMUNITY): Payer: Medicare Other | Admitting: Anesthesiology

## 2018-09-16 ENCOUNTER — Ambulatory Visit (HOSPITAL_COMMUNITY): Payer: Medicare Other

## 2018-09-16 DIAGNOSIS — Z85038 Personal history of other malignant neoplasm of large intestine: Secondary | ICD-10-CM | POA: Insufficient documentation

## 2018-09-16 DIAGNOSIS — Z7901 Long term (current) use of anticoagulants: Secondary | ICD-10-CM | POA: Insufficient documentation

## 2018-09-16 DIAGNOSIS — F329 Major depressive disorder, single episode, unspecified: Secondary | ICD-10-CM | POA: Diagnosis not present

## 2018-09-16 DIAGNOSIS — F419 Anxiety disorder, unspecified: Secondary | ICD-10-CM | POA: Insufficient documentation

## 2018-09-16 DIAGNOSIS — Z923 Personal history of irradiation: Secondary | ICD-10-CM | POA: Insufficient documentation

## 2018-09-16 DIAGNOSIS — K831 Obstruction of bile duct: Secondary | ICD-10-CM | POA: Insufficient documentation

## 2018-09-16 DIAGNOSIS — Z7984 Long term (current) use of oral hypoglycemic drugs: Secondary | ICD-10-CM | POA: Insufficient documentation

## 2018-09-16 DIAGNOSIS — E1151 Type 2 diabetes mellitus with diabetic peripheral angiopathy without gangrene: Secondary | ICD-10-CM | POA: Insufficient documentation

## 2018-09-16 DIAGNOSIS — C189 Malignant neoplasm of colon, unspecified: Secondary | ICD-10-CM

## 2018-09-16 DIAGNOSIS — C7800 Secondary malignant neoplasm of unspecified lung: Secondary | ICD-10-CM | POA: Diagnosis not present

## 2018-09-16 DIAGNOSIS — I10 Essential (primary) hypertension: Secondary | ICD-10-CM | POA: Diagnosis not present

## 2018-09-16 DIAGNOSIS — Z85048 Personal history of other malignant neoplasm of rectum, rectosigmoid junction, and anus: Secondary | ICD-10-CM | POA: Insufficient documentation

## 2018-09-16 DIAGNOSIS — R17 Unspecified jaundice: Secondary | ICD-10-CM | POA: Insufficient documentation

## 2018-09-16 DIAGNOSIS — C250 Malignant neoplasm of head of pancreas: Secondary | ICD-10-CM | POA: Diagnosis not present

## 2018-09-16 DIAGNOSIS — T451X5A Adverse effect of antineoplastic and immunosuppressive drugs, initial encounter: Secondary | ICD-10-CM | POA: Diagnosis not present

## 2018-09-16 DIAGNOSIS — C7889 Secondary malignant neoplasm of other digestive organs: Secondary | ICD-10-CM | POA: Insufficient documentation

## 2018-09-16 DIAGNOSIS — Z79899 Other long term (current) drug therapy: Secondary | ICD-10-CM | POA: Diagnosis not present

## 2018-09-16 DIAGNOSIS — Z86718 Personal history of other venous thrombosis and embolism: Secondary | ICD-10-CM | POA: Diagnosis not present

## 2018-09-16 DIAGNOSIS — E1140 Type 2 diabetes mellitus with diabetic neuropathy, unspecified: Secondary | ICD-10-CM | POA: Diagnosis not present

## 2018-09-16 DIAGNOSIS — Z86711 Personal history of pulmonary embolism: Secondary | ICD-10-CM | POA: Diagnosis not present

## 2018-09-16 DIAGNOSIS — G620 Drug-induced polyneuropathy: Secondary | ICD-10-CM | POA: Insufficient documentation

## 2018-09-16 DIAGNOSIS — K219 Gastro-esophageal reflux disease without esophagitis: Secondary | ICD-10-CM | POA: Insufficient documentation

## 2018-09-16 HISTORY — PX: SPHINCTEROTOMY: SHX5279

## 2018-09-16 HISTORY — PX: BILIARY STENT PLACEMENT: SHX5538

## 2018-09-16 HISTORY — PX: ERCP: SHX5425

## 2018-09-16 LAB — GLUCOSE, CAPILLARY
Glucose-Capillary: 119 mg/dL — ABNORMAL HIGH (ref 70–99)
Glucose-Capillary: 154 mg/dL — ABNORMAL HIGH (ref 70–99)

## 2018-09-16 SURGERY — ERCP, WITH INTERVENTION IF INDICATED
Anesthesia: General | Site: Esophagus

## 2018-09-16 MED ORDER — GLUCAGON HCL RDNA (DIAGNOSTIC) 1 MG IJ SOLR
INTRAMUSCULAR | Status: DC | PRN
  Administered 2018-09-16 (×2): 0.25 mg via INTRAVENOUS

## 2018-09-16 MED ORDER — FENTANYL CITRATE (PF) 100 MCG/2ML IJ SOLN
INTRAMUSCULAR | Status: DC | PRN
  Administered 2018-09-16 (×2): 50 ug via INTRAVENOUS

## 2018-09-16 MED ORDER — ROCURONIUM 10MG/ML (10ML) SYRINGE FOR MEDFUSION PUMP - OPTIME
INTRAVENOUS | Status: DC | PRN
  Administered 2018-09-16: 10 mg via INTRAVENOUS

## 2018-09-16 MED ORDER — MIDAZOLAM HCL 2 MG/2ML IJ SOLN
INTRAMUSCULAR | Status: AC
  Filled 2018-09-16: qty 2

## 2018-09-16 MED ORDER — WATER FOR IRRIGATION, STERILE IR SOLN
Status: DC | PRN
  Administered 2018-09-16: 1000 mL via SURGICAL_CAVITY

## 2018-09-16 MED ORDER — KETAMINE HCL 50 MG/5ML IJ SOSY
PREFILLED_SYRINGE | INTRAMUSCULAR | Status: AC
  Filled 2018-09-16: qty 5

## 2018-09-16 MED ORDER — LACTATED RINGERS IV SOLN
INTRAVENOUS | Status: DC
  Administered 2018-09-16: 12:00:00 via INTRAVENOUS

## 2018-09-16 MED ORDER — LIDOCAINE HCL (PF) 2 % IJ SOLN
INTRAMUSCULAR | Status: AC
  Filled 2018-09-16: qty 10

## 2018-09-16 MED ORDER — DEXAMETHASONE SODIUM PHOSPHATE 4 MG/ML IJ SOLN
INTRAMUSCULAR | Status: AC
  Filled 2018-09-16: qty 1

## 2018-09-16 MED ORDER — MIDAZOLAM HCL 5 MG/5ML IJ SOLN
INTRAMUSCULAR | Status: DC | PRN
  Administered 2018-09-16: 2 mg via INTRAVENOUS

## 2018-09-16 MED ORDER — SUCCINYLCHOLINE CHLORIDE 200 MG/10ML IV SOSY
PREFILLED_SYRINGE | INTRAVENOUS | Status: AC
  Filled 2018-09-16: qty 10

## 2018-09-16 MED ORDER — SUCCINYLCHOLINE 20MG/ML (10ML) SYRINGE FOR MEDFUSION PUMP - OPTIME
INTRAMUSCULAR | Status: DC | PRN
  Administered 2018-09-16: 150 mg via INTRAVENOUS

## 2018-09-16 MED ORDER — KETAMINE HCL 10 MG/ML IJ SOLN
INTRAMUSCULAR | Status: DC | PRN
  Administered 2018-09-16: 50 mg via INTRAVENOUS

## 2018-09-16 MED ORDER — CEFAZOLIN SODIUM-DEXTROSE 2-4 GM/100ML-% IV SOLN
2.0000 g | INTRAVENOUS | Status: AC
  Administered 2018-09-16: 2 g via INTRAVENOUS
  Filled 2018-09-16: qty 100

## 2018-09-16 MED ORDER — GLUCAGON HCL RDNA (DIAGNOSTIC) 1 MG IJ SOLR
INTRAMUSCULAR | Status: AC
  Filled 2018-09-16: qty 2

## 2018-09-16 MED ORDER — FENTANYL CITRATE (PF) 100 MCG/2ML IJ SOLN
INTRAMUSCULAR | Status: AC
  Filled 2018-09-16: qty 2

## 2018-09-16 MED ORDER — ROCURONIUM BROMIDE 10 MG/ML (PF) SYRINGE
PREFILLED_SYRINGE | INTRAVENOUS | Status: AC
  Filled 2018-09-16: qty 10

## 2018-09-16 MED ORDER — PROPOFOL 10 MG/ML IV BOLUS
INTRAVENOUS | Status: DC | PRN
  Administered 2018-09-16: 70 mg via INTRAVENOUS
  Administered 2018-09-16: 30 mg via INTRAVENOUS

## 2018-09-16 MED ORDER — ONDANSETRON HCL 4 MG/2ML IJ SOLN: 4.0000 mg | Freq: Once | INTRAMUSCULAR | Status: DC | PRN

## 2018-09-16 MED ORDER — SIMETHICONE 40 MG/0.6ML PO SUSP
ORAL | Status: AC
  Filled 2018-09-16: qty 0.6

## 2018-09-16 MED ORDER — RIVAROXABAN 20 MG PO TABS: 20.0000 mg | ORAL_TABLET | Freq: Every day | ORAL | 3 refills | Status: AC

## 2018-09-16 MED ORDER — ONDANSETRON HCL 4 MG/2ML IJ SOLN
INTRAMUSCULAR | Status: DC | PRN
  Administered 2018-09-16: 4 mg via INTRAVENOUS

## 2018-09-16 MED ORDER — KETOROLAC TROMETHAMINE 30 MG/ML IJ SOLN: 30.0000 mg | Freq: Once | INTRAMUSCULAR | Status: DC | PRN

## 2018-09-16 MED ORDER — SODIUM CHLORIDE 0.9 % IV SOLN
INTRAVENOUS | Status: DC | PRN
  Administered 2018-09-16: 100 mL

## 2018-09-16 MED ORDER — MEPERIDINE HCL 50 MG/ML IJ SOLN: 6.2500 mg | INTRAMUSCULAR | Status: DC | PRN

## 2018-09-16 MED ORDER — HYDROMORPHONE HCL 1 MG/ML IJ SOLN: 0.2500 mg | INTRAMUSCULAR | Status: DC | PRN

## 2018-09-16 MED ORDER — MINERAL OIL PO OIL
TOPICAL_OIL | ORAL | Status: AC
  Filled 2018-09-16: qty 60

## 2018-09-16 MED ORDER — CHLORHEXIDINE GLUCONATE CLOTH 2 % EX PADS: 6.0000 | MEDICATED_PAD | Freq: Once | CUTANEOUS | Status: DC

## 2018-09-16 MED ORDER — PROPOFOL 10 MG/ML IV BOLUS
INTRAVENOUS | Status: AC
  Filled 2018-09-16: qty 20

## 2018-09-16 MED ORDER — HYDROCODONE-ACETAMINOPHEN 7.5-325 MG PO TABS: 1.0000 | ORAL_TABLET | Freq: Once | ORAL | Status: DC | PRN

## 2018-09-16 MED ORDER — SODIUM CHLORIDE 0.9 % IV SOLN
INTRAVENOUS | Status: AC
  Filled 2018-09-16: qty 50

## 2018-09-16 NOTE — Discharge Instructions (Signed)
Endoscopic Retrograde Cholangiopancreatogram, Care After This sheet gives you information about how to care for yourself after your procedure. Your health care provider may also give you more specific instructions. If you have problems or questions, contact your health care provider. What can I expect after the procedure? After the procedure, it is common to have:  Soreness in your throat.  Nausea.  Bloating.  Dizziness.  Tiredness (fatigue). Follow these instructions at home:   Take over-the-counter and prescription medicines only as told by your health care provider.  Do not drive for 24 hours if you were given a medicine to help you relax (sedative) during your procedure. Have someone stay with you for 24 hours after the procedure.  Return to your normal activities as told by your health care provider. Ask your health care provider what activities are safe for you.  Return to eating what you normally do as soon as you feel well enough or as told by your health care provider.  Keep all follow-up visits as told by your health care provider. This is important. Contact a health care provider if:  You have pain in your abdomen that does not get better with medicine.  You develop signs of infection, such as: ? Chills. ? Feeling unwell. Get help right away if:  You have difficulty swallowing.  You have worsening pain in your throat, chest, or abdomen.  You vomit bright red blood or a substance that looks like coffee grounds.  You have bloody or very black stools.  You have a fever.  You have a sudden increase in swelling (bloating) in your abdomen. Summary  After the procedure, it is common to feel tired and to have some discomfort in your throat.  Contact your health care provider if you have signs of infection--such as chills or feeling unwell--or if you have pain that does not improve with medicine.  Get help right away if you have trouble swallowing, worsening  pain, bloody or black vomit, bloody or black stools, a fever, or increased swelling in your abdomen.  Keep all follow-up visits as told by your health care provider. This is important. This information is not intended to replace advice given to you by your health care provider. Make sure you discuss any questions you have with your health care provider. Document Released: 02/09/2013 Document Revised: 03/10/2016 Document Reviewed: 03/10/2016 Elsevier Interactive Patient Education  2019 [place] on 09/19/2018. Resume other medications as before. Clear liquids today and usual diet starting tomorrow morning. No driving for 24 hours.

## 2018-09-16 NOTE — Op Note (Signed)
[facility] Patient Name: Charles [name] Procedure Date: 09/16/2018 10:13 AM MRN: [id number] Date of Birth: [birth year] Attending MD: Hildred Laser , MD CSN: [id number] Age: 68 Admit Type: Outpatient Procedure:                ERCP Indications:              Jaundice, Malignant tumor of the head of                            pancreas(metastatic colon carcinoma) Providers:                Hildred Laser, MD, Otis Peak B. Sharon Seller, RN, Starla Link RN, RN, Raphael Gibney, Technician, Aram Candela Referring MD:             Derek Jack, MD Medicines:                General Anesthesia Complications:            No immediate complications. Estimated Blood Loss:     Estimated blood loss: none. Procedure:                Pre-Anesthesia Assessment:                           - Prior to the procedure, a History and Physical                            was performed, and patient medications and                            allergies were reviewed. The patient's tolerance of                            previous anesthesia was also reviewed. The risks                            and benefits of the procedure and the sedation                            options and risks were discussed with the patient.                            All questions were answered, and informed consent                            was obtained. Prior Anticoagulants: The patient                            last took Xarelto (rivaroxaban) 3 days prior to the  procedure. ASA Grade Assessment: III - A patient                            with severe systemic disease. After reviewing the                            risks and benefits, the patient was deemed in                            satisfactory condition to undergo the procedure.                           After obtaining informed consent, the scope was                            passed under  direct vision. Throughout the                            procedure, the patient's blood pressure, pulse, and                            oxygen saturations were monitored continuously. The                            [id number]-[id number] ([id number]) scope was introduced through                            the mouth, and used to inject contrast into and                            used to inject contrast into the bile duct. The                            ERCP was accomplished without difficulty. The                            patient tolerated the procedure well. Scope In: 1:28:24 PM Scope Out: 2:09:07 PM Total Procedure Duration: 0 hours 40 minutes 43 seconds  Findings:      The scout film was normal. The esophagus was successfully intubated       under direct vision. The scope was advanced to a normal major papilla in       the descending duodenum without detailed examination of the pharynx,       larynx and associated structures, and upper GI tract. The upper GI tract       was grossly normal. The bile duct was deeply cannulated with the       Hydratome sphincterotome. Contrast was injected. I personally       interpreted the bile duct images. Ductal flow of contrast was adequate.       Image quality was excellent. Contrast extended to the entire biliary       tree. The common bile duct contained a single localized stenosis with       upstream moderate biliary dilated, secondary to a stricture. Unable to  place wall stent because of tight hard stricture. A 4 mm biliary       sphincterotomy was made with a braided Hydratome sphincterotome using       ERBE electrocautery. There was self limited oozing from the       sphincterotomy which did not require treatment. Dilation of the       stricture with a 6-7-8 mm balloon (to a maximum balloon size of 6 mm)       dilator was successful. Attempt at ste One 10 mm by 6 cm covered metal       stent was placed 5 cm into the common bile duct. Bile flowed  through the       stent. The stent was in good position. Impression:               - A single localized biliary stricture was found in                            the common bile duct. The stricture was malignant                            secondary to known metastatic disease..                           - The biliary system was moderately dilated,                            secondary to a stricture.                           - A biliary sphincterotomy was performed and                            stricture dilated to 6 mm with balloon to                            facilitate stent placement.                           - One covered metal stent was placed into the                            common bile duct. Moderate Sedation:      Per Anesthesia Care Recommendation:           - Discharge patient to home (with spouse).                           - Clear liquid diet today.                           - Resume previous diet starting tomorrow.                           - Continue present medications.                           - Resume Xarelto (rivaroxaban)  at prior dose in 3                            days.                           - Avoid aspirin and nonsteroidal anti-inflammatory                            medicines for 3 days.                           - Follow-up at oncology clinic on 09/20/2018. Procedure Code(s):        --- Professional ---                           [id number], Endoscopic retrograde                            cholangiopancreatography (ERCP); with placement of                            endoscopic stent into biliary or pancreatic duct,                            including pre- and post-dilation and guide wire                            passage, when performed, including sphincterotomy,                            when performed, each stent Diagnosis Code(s):        --- Professional ---                           K83.1, Obstruction of bile duct                           R17,  Unspecified jaundice                           C25.0, Malignant neoplasm of head of pancreas CPT copyright 2019 [employer]. All rights reserved. The codes documented in this report are preliminary and upon coder review may  be revised to meet current compliance requirements. Hildred Laser, MD Hildred Laser, MD 09/16/2018 2:40:34 PM This report has been signed electronically. Number of Addenda: 0

## 2018-09-16 NOTE — Anesthesia Preprocedure Evaluation (Addendum)
Anesthesia Evaluation  Patient identified by MRN, date of birth, ID band Patient awake    Reviewed: Allergy & Precautions, NPO status , Patient's Chart, lab work & pertinent test results, reviewed documented beta blocker date and time   History of Anesthesia Complications Negative for: history of anesthetic complications  Airway Mallampati: II  TM Distance: >3 FB Neck ROM: Full    Dental  (+) Poor Dentition, Missing, Chipped, Dental Advisory Given   Pulmonary shortness of breath and with exertion, pneumonia,  Hx lung mets from rectal cancer    breath sounds clear to auscultation       Cardiovascular Exercise Tolerance: Good hypertension, + Peripheral Vascular Disease and + DVT (Thrombolysis in 5/16)   Rhythm:Regular Rate:Normal     Neuro/Psych PSYCHIATRIC DISORDERS (depression, anxiety) Anxiety Depression  Neuromuscular disease (peripheral neuropathy from chemo)    GI/Hepatic Neg liver ROS, hiatal hernia, GERD  Medicated and Controlled,  Endo/Other  negative endocrine ROSdiabetes, Type 2, Oral Hypoglycemic Agents  Renal/GU negative Renal ROS     Musculoskeletal   Abdominal   Peds  Hematology   Anesthesia Other Findings Pancreatic mass   Reproductive/Obstetrics                             Anesthesia Physical  Anesthesia Plan  ASA: IV  Anesthesia Plan: General   Post-op Pain Management:    Induction: Intravenous  PONV Risk Score and Plan:   Airway Management Planned: Oral ETT  Additional Equipment:   Intra-op Plan:   Post-operative Plan:   Informed Consent: I have reviewed the patients History and Physical, chart, labs and discussed the procedure including the risks, benefits and alternatives for the proposed anesthesia with the patient or authorized representative who has indicated his/her understanding and acceptance.       Plan Discussed with: CRNA  Anesthesia Plan  Comments:        Anesthesia Quick Evaluation

## 2018-09-16 NOTE — H&P (Signed)
Charles [name] is an 68 y.o. male.   Chief Complaint: Patient is here for ERCP with biliary stenting. HPI: Patient is 68 year old Caucasian male who has metastatic colon carcinoma with known mets to pancreatic head who is receiving chemotherapy who was noted to have abnormal LFTs leading to further testing and he has dilated biliary system with distal cutoff/obstruction.  Gallbladder was distended on MRCP.  He has constant soreness pain in epigastric region right upper quadrant.  He noted his urine to be orange color few days ago.  He has not experienced nausea vomiting fever or chills.  He has had increased frequency of defecation since his bowel surgery.  He states he has 10 to  12 small stools daily.  He has not had pruritus. He has noted decrease in his appetite over the last few days. Last Xarelto dose was 3 days ago. Patient was initially diagnosed with rectal carcinoma back in 2012.  He had preop chemoradiation followed by surgery with ileostomy which was subsequently taken down.  He had more postop chemotherapy and was disease-free until 2016 when he was found to have mets to the lung.  He had lung resection.  Since then lung disease has recurred and he also was found to have metastatic disease to pancreas.  Past Medical History:  Diagnosis Date  . Chronic anticoagulation   . Colon cancer ([place]) 07/24/2010   rectal ca, inv adenocarcinoma  . Depression 04/01/2011  . Diabetes mellitus without complication ([place])   . DVT (deep venous thrombosis) ([place]) 05/09/2011  . GERD (gastroesophageal reflux disease)   . High output ileostomy ([place]) 05/21/2011  . History of kidney stones   . HTN (hypertension)   . Hx of radiation therapy 09/02/10 to 10/14/10   pelvis  . Lung metastasis ([place])   . Neuropathy   . Peripheral vascular disease ([place])    dvt's,pe  . Pneumonia    hx x3  . Pulmonary embolism ([place])   . Rectal cancer ([place]) 01/15/2011   S/P radiation and concurrent 5-FU continuous infusion from  09/09/10- 10/10/10.  S/P proctectomy with colorectal anastomosis and diverting loop ileostomy on 11/14/10 at [facility] by Dr. Harlon Ditty. Pathology reveals a pT3b N1 with 3/20 lymph nodes.      Past Surgical History:  Procedure Laterality Date  . COLON SURGERY  11/14/2010   proctectomy with colorectal anastomosis and diverting loop ileostomy (temporary planned)  . COLONOSCOPY  07/2010   proximal rectal apple core mass 10-14cm from anal verge (adenocarcinoma), 2-3cm distal rectal carpet polyp s/p piecemeal snare polypectomy (adenoma)  . COLONOSCOPY  04/21/2012   RMR: Friable,fibrotic appearing colorectal anastomosis producing some luminal narrowing-not felt to be critical. path: focal erosion with slight inflammation and hyperemia. SURVEILLANCE DUE DEC 2015  . COLONOSCOPY N/A 11/24/2013   Dr. Rourk:somewhat fibrotic/friable anastomotic mucosal-status post biopsy (narrowing not felt to be clinically significant) Single colonic diverticulum. benign polypoid rectal mucosa  . colostomy reversal  april 2013  . ESOPHAGOGASTRODUODENOSCOPY  07/2010   RMR: schatki ring s/p dilation, small hh, SB bx benign  . ESOPHAGOGASTRODUODENOSCOPY N/A 09/04/2015   Procedure: ESOPHAGOGASTRODUODENOSCOPY (EGD);  Surgeon: Daneil Dolin, MD;  Location: [facility];  Service: Endoscopy;  Laterality: N/A;  730  . EUS  08/2010   Dr. Owens Loffler. uT3N0 circumferential, nearly obstruction rectosigmoid adenocarcinoma, distal edge 12cm from anal verge  . EUS N/A 06/19/2016   Procedure: UPPER ENDOSCOPIC ULTRASOUND (EUS) RADIAL;  Surgeon: Milus Banister, MD;  Location: [facility] END[facility];  Service: Endoscopy;  Laterality: N/A;  . HERNIA REPAIR     abd hernia repair  . IVC filter    . ivc filter    . port a cath placement    . PORT-A-CATH REMOVAL  09/24/2011   Procedure: REMOVAL PORT-A-CATH;  Surgeon: Donato Heinz, MD;  Location: [facility] ORS;  Service: General;  Laterality: N/A;  Minor Room  . PORTACATH PLACEMENT Right 07/07/2016    Procedure: INSERTION PORT-A-CATH;  Surgeon: Aviva Signs, MD;  Location: [facility] ORS;  Service: General;  Laterality: Right;  Marland Kitchen VIDEO ASSISTED THORACOSCOPY (VATS)/WEDGE RESECTION Right 11/15/2014   Procedure: VIDEO ASSISTED THORACOSCOPY (VATS)/LUNG RESECTION WITH RIGHT LINGULECTOMY;  Surgeon: [name], MD;  Location: [place];  Service: Thoracic;  Laterality: Right;  Marland Kitchen VIDEO BRONCHOSCOPY N/A 11/15/2014   Procedure: VIDEO BRONCHOSCOPY;  Surgeon: [name], MD;  Location: [facility] OR;  Service: Thoracic;  Laterality: N/A;    Family History  Problem Relation Age of Onset  . Cancer Brother        throat  . Cancer Brother        prostate  . Colon cancer Neg Hx   . Liver disease Neg Hx   . Inflammatory bowel disease Neg Hx    Social History:  reports that he has never smoked. He has never used smokeless tobacco. He reports that he does not drink alcohol or use drugs.  Allergies:  Allergies  Allergen Reactions  . Oxycodone     Blisters, hallucinations  . Tramadol     Blisters, hallucinations  . Trazodone And Nefazodone Other (See Comments)    hallucinations    Medications Prior to Admission  Medication Sig Dispense Refill  . acetaminophen (TYLENOL) 500 MG tablet Take 1,000 mg by mouth every 6 (six) hours as needed.    . capecitabine (XELODA) 500 MG tablet TAKE 3 TABLETS BY MOUTH TWICE DAILY FOR 14 DAYS IN A ROW FOLLOWED BY A 7 DAY REST PERIOD. TAKE AFTER MEAL 84 tablet 1  . clonazePAM (KLONOPIN) 0.5 MG tablet Take 1 tablet (0.5 mg total) by mouth 2 (two) times daily as needed. 30 tablet 0  . Diphenhyd-Hydrocort-Nystatin (FIRST-DUKES MOUTHWASH) SUSP Use as directed 5 mLs in the mouth or throat 4 (four) times daily. (Patient taking differently: Use as directed 5 mLs in the mouth or throat 4 (four) times daily as needed (thrush). ) 360 mL 3  . diphenoxylate-atropine (LOMOTIL) 2.5-0.025 MG tablet Take 1 tablet by mouth 4 (four) times daily as needed for diarrhea or loose stools. 60 tablet 3   . gabapentin (NEURONTIN) 300 MG capsule Take 3 capsules (900 mg total) by mouth 2 (two) times daily. 540 capsule 1  . HYDROcodone-acetaminophen (NORCO) 5-325 MG tablet Take 1-2 tablets by mouth every 6 (six) hours as needed for moderate pain. 60 tablet 0  . mirtazapine (REMERON) 7.5 MG tablet Take 1 tablet (7.5 mg total) by mouth at bedtime. 30 tablet 1  . omeprazole (PRILOSEC) 20 MG capsule Take 20 mg by mouth 2 (two) times daily before a meal.     . ondansetron (ZOFRAN) 8 MG tablet Take 1 tablet (8 mg total) by mouth 2 (two) times daily as needed for refractory nausea / vomiting. Start on day 3 after chemotherapy. 30 tablet 1  . Probiotic Product (PROBIOTIC DAILY PO) Take 1 capsule by mouth daily.    . prochlorperazine (COMPAZINE) 10 MG tablet Take 1 tablet (10 mg total) by mouth every 6 (six) hours as needed (Nausea or vomiting). 30 tablet 1  .  vitamin B-12 (CYANOCOBALAMIN) 1000 MCG tablet Take 1,000 mcg by mouth daily.    Marland Kitchen glimepiride (AMARYL) 2 MG tablet Take 2 mg by mouth daily with breakfast.     . lipase/protease/amylase (CREON) 36000 UNITS CPEP capsule Take 2 capsules (72,000 Units total) by mouth 3 (three) times daily with meals. Take one capsule with snacks (Patient not taking: Reported on 09/14/2018) 180 capsule 0  . lisinopril (PRINIVIL,ZESTRIL) 10 MG tablet Take 10 mg by mouth 2 (two) times a day.     . magnesium oxide (MAG-OX) 400 (241.3 Mg) MG tablet Take 1 tablet (400 mg total) by mouth 2 (two) times daily. 60 tablet 3  . rivaroxaban (XARELTO) 20 MG TABS tablet Take 1 tablet (20 mg total) by mouth daily with supper. 30 tablet 3  . tamsulosin (FLOMAX) 0.4 MG CAPS capsule Take 0.4 mg by mouth daily.    . traZODone (DESYREL) 100 MG tablet Take 100 mg by mouth at bedtime.      Results for orders placed or performed during the hospital encounter of 09/16/18 (from the past 48 hour(s))  Glucose, capillary     Status: Abnormal   Collection Time: 09/16/18 11:58 AM  Result Value Ref  Range   Glucose-Capillary 119 (H) 70 - 99 mg/dL   No results found.  ROS  Blood pressure (!) 151/82, pulse 74, temperature 97.8 F (36.6 C), temperature source Oral, resp. rate 16, height 6\' 2"  (1.88 m), weight 89.8 kg, SpO2 97 %. Physical Exam  Constitutional: He appears well-developed and well-nourished.  HENT:  Mouth/Throat: Oropharynx is clear and moist.  He has geographic tongue.  Eyes: Scleral icterus is present.  Neck: No thyromegaly present.  Cardiovascular: Normal rate, regular rhythm and normal heart sounds.  No murmur heard. Respiratory: Effort normal and breath sounds normal.  He has PowerPort in place and right pectoral region  GI:  Abdomen is symmetrical.  He has horizontal scar in pelvic region as well as ileostomy scarring right lower quadrant.  Abdomen is soft.  He has mild tenderness epigastrium and below the right costal margin.  Liver edge is indistinct.  Musculoskeletal:        General: No edema.  Lymphadenopathy:    He has no cervical adenopathy.  Neurological: He is alert.  Skin: Skin is warm and dry.    I have reviewed patient's blood work as well as ultrasound and MRCP both from 09/10/2018. Patient has distended gallbladder with long cystic duct with joins bile duct along the right side distally.  Assessment/Plan  Obstructive jaundice secondary to metastatic colon cancer involving pancreatic head. Given patient's diagnosis it would be appropriate to proceed with wall stenting.  I would recommend fully covered Wallstent.  Lifespan of Wallstent would be twice that of plastic stent.  I have discussed the potential risk such as stent migration occlusion as well as cholecystitis. Patient is agreeable.  I also talk with his wife over the phone and she is in agreement as well. ERCP with biliary stenting with covered Wallstent.  Hildred Laser, MD 09/16/2018, 12:47 PM

## 2018-09-16 NOTE — Anesthesia Procedure Notes (Signed)
Procedure Name: Intubation Date/Time: 09/16/2018 1:12 PM Performed by: Ollen Bowl, CRNA Pre-anesthesia Checklist: Patient identified, Patient being monitored, Timeout performed, Emergency Drugs available and Suction available Patient Re-evaluated:Patient Re-evaluated prior to induction Oxygen Delivery Method: Circle system utilized Preoxygenation: Pre-oxygenation with 100% oxygen Induction Type: IV induction Laryngoscope Size: Glidescope (s3) Grade View: Grade I Tube type: Oral Tube size: 7.0 mm Number of attempts: 1 Airway Equipment and Method: Rigid stylet Placement Confirmation: ETT inserted through vocal cords under direct vision,  positive ETCO2 and breath sounds checked- equal and bilateral Secured at: 23 cm Tube secured with: Tape Dental Injury: Teeth and Oropharynx as per pre-operative assessment

## 2018-09-16 NOTE — Transfer of Care (Signed)
Immediate Anesthesia Transfer of Care Note  Patient: Charles [name]  Procedure(s) Performed: ENDOSCOPIC RETROGRADE CHOLANGIOPANCREATOGRAPHY (ERCP) (N/A Esophagus) STENT PLACEMENT (N/A Esophagus) SPHINCTEROTOMY with balloon dialation (N/A Esophagus)  Patient Location: PACU  Anesthesia Type:General  Level of Consciousness: awake and alert   Airway & Oxygen Therapy: Patient Spontanous Breathing and Patient connected to face mask oxygen  Post-op Assessment: Report given to RN  Post vital signs: Reviewed and stable  Last Vitals:  Vitals Value Taken Time  BP 149/82 09/16/2018  2:31 PM  Temp    Pulse 75 09/16/2018  2:34 PM  Resp 13 09/16/2018  2:34 PM  SpO2 100 % 09/16/2018  2:34 PM  Vitals shown include unvalidated device data.  Last Pain:  Vitals:   09/16/18 1137  TempSrc: Oral  PainSc: 4          Complications: No apparent anesthesia complications

## 2018-09-16 NOTE — Anesthesia Postprocedure Evaluation (Addendum)
Anesthesia Post Note  Patient: JOHNNELL LIOU  Procedure(s) Performed: ENDOSCOPIC RETROGRADE CHOLANGIOPANCREATOGRAPHY (ERCP) (N/A Esophagus) STENT PLACEMENT (N/A Esophagus) SPHINCTEROTOMY with balloon dialation (N/A Esophagus)  Patient location during evaluation: PACU Anesthesia Type: General Level of consciousness: awake and alert and oriented Pain management: pain level controlled Vital Signs Assessment: post-procedure vital signs reviewed and stable Respiratory status: spontaneous breathing Cardiovascular status: blood pressure returned to baseline and stable Postop Assessment: no apparent nausea or vomiting Anesthetic complications: no   General anesthetic, inadvertantly noted mac.  Last Vitals:  Vitals:   09/16/18 1445 09/16/18 1452  BP: (!) 154/86   Pulse: 76 74  Resp: 14 (!) 21  Temp:    SpO2: 98% 98%    Last Pain:  Vitals:   09/16/18 1432  TempSrc:   PainSc: 0-No pain                 Rayana Geurin

## 2018-09-17 ENCOUNTER — Encounter (HOSPITAL_COMMUNITY): Payer: Self-pay | Admitting: Internal Medicine

## 2018-09-17 NOTE — Addendum Note (Signed)
Addendum  created 09/17/18 0935 by Ollen Bowl, CRNA   Clinical Note Signed

## 2018-09-20 ENCOUNTER — Other Ambulatory Visit: Payer: Self-pay

## 2018-09-20 ENCOUNTER — Encounter (HOSPITAL_COMMUNITY): Payer: Self-pay | Admitting: Hematology

## 2018-09-20 ENCOUNTER — Inpatient Hospital Stay (HOSPITAL_BASED_OUTPATIENT_CLINIC_OR_DEPARTMENT_OTHER): Payer: Medicare Other | Admitting: Hematology

## 2018-09-20 ENCOUNTER — Inpatient Hospital Stay (HOSPITAL_COMMUNITY): Payer: Medicare Other

## 2018-09-20 VITALS — BP 156/82 | HR 88 | Temp 97.4°F | Resp 16 | Wt 193.5 lb

## 2018-09-20 DIAGNOSIS — C7889 Secondary malignant neoplasm of other digestive organs: Secondary | ICD-10-CM

## 2018-09-20 DIAGNOSIS — C20 Malignant neoplasm of rectum: Secondary | ICD-10-CM

## 2018-09-20 DIAGNOSIS — G893 Neoplasm related pain (acute) (chronic): Secondary | ICD-10-CM | POA: Diagnosis not present

## 2018-09-20 DIAGNOSIS — Z7189 Other specified counseling: Secondary | ICD-10-CM

## 2018-09-20 DIAGNOSIS — G4700 Insomnia, unspecified: Secondary | ICD-10-CM

## 2018-09-20 DIAGNOSIS — Z79899 Other long term (current) drug therapy: Secondary | ICD-10-CM

## 2018-09-20 LAB — HEPATIC FUNCTION PANEL
ALT: 78 U/L — ABNORMAL HIGH (ref 0–44)
AST: 32 U/L (ref 15–41)
Albumin: 3.4 g/dL — ABNORMAL LOW (ref 3.5–5.0)
Alkaline Phosphatase: 271 U/L — ABNORMAL HIGH (ref 38–126)
Bilirubin, Direct: 0.9 mg/dL — ABNORMAL HIGH (ref 0.0–0.2)
Indirect Bilirubin: 1.4 mg/dL — ABNORMAL HIGH (ref 0.3–0.9)
Total Bilirubin: 2.3 mg/dL — ABNORMAL HIGH (ref 0.3–1.2)
Total Protein: 6.5 g/dL (ref 6.5–8.1)

## 2018-09-20 MED ORDER — TRAZODONE HCL 100 MG PO TABS: 100.0000 mg | ORAL_TABLET | Freq: Every day | ORAL | 6 refills | Status: DC

## 2018-09-20 NOTE — Progress Notes (Signed)
Charles [name], [place] [address]   CLINIC:  Medical Oncology/Hematology  PCP:  [name], MD [place] [address] [phone]   REASON FOR VISIT:  Follow-up for rectal cancer   BRIEF ONCOLOGIC HISTORY:    Rectal cancer ([place])   07/24/2010 Initial Diagnosis    Invasive adenocarcinoma of rectum    09/09/2010 Concurrent Chemotherapy    S/P radiation and concurrent 5-FU continuous infusion from 09/09/10- 10/10/10.    11/14/2010 Surgery    S/P proctectomy with colorectal anastomosis and diverting loop ileostomy on 11/14/10 at [facility] by Dr. Harlon Ditty. Pathology reveals a pT3b N1 with 3/20 lymph nodes.    02/05/2011 - 07/14/2011 Chemotherapy    FOLFOX    08/18/2011 Surgery    Approximate date of surgery- [place] by Dr. Harlon Ditty     Remission       11/24/2013 Survivorship    Colonoscopy- somewhat fibrotic/friable anastomotic mucosal-status post biopsy (narrowing not felt to be clinically significant).  Negative pathology for malignancy    09/12/2014 Imaging    DVT in the left femoral venous system, left common iliac vein, IVC, and within the IVC filter Right lower extremity venography confirms chronic occlusion of the femoral venous system with collateralization. The right iliac venous system is patent and do    09/25/2014 PET scan    The right middle lobe pulmonary nodule is hypermetabolic, favored to represent a primary bronchogenic carcinoma.Equivocal mediastinal nodes, similar to surrounding blood pool. Bilateral adrenal hypermetabolism, felt to be physiologic    10/24/2014 Pathology Results    Lung, needle/core biopsy(ies), RML - ADENOCARCINOMA, SEE COMMENT metastatic adenocarcinoma of a colorectal primary    10/24/2014 Relapse/Recurrence       11/16/2014 Definitive Surgery    Bronchoscopy, right video-assisted thoracoscopy, wedge resection of right middle lobe by Dr. Servando Snare    11/16/2014 Pathology Results    Lung, wedge  biopsy/resection, right lingula and small portion of middle lobe - METASTATIC ADENOCARCINOMA, CONSISTENT WITH COLORECTAL PRIMARY, SPANNING 2.0 CM. - THE SURGICAL RESECTION MARGINS ARE NEGATIVE FOR ADENOCARCINOMA.    11/16/2014 Remission    THE SURGICAL RESECTION MARGINS ARE NEGATIVE FOR ADENOCARCINOMA.    03/07/2015 Imaging    CT CAP- Interval resection of right middle lobe metastasis. No acute process or evidence of metastatic disease in the chest, abdomen or pelvis. Improved right upper lobe reticular nodular opacities are favored to represent resolving infection.    09/14/2015 Imaging    CT CAP- No findings of recurrent malignancy. No recurrence along the wedge resection site of the right middle lobe. Right anterior abdominal wall focal hernia containing a knuckle of small bowel without complicating feature.    06/13/2016 Imaging    CT CAP- 1. New right-sided pleural metastasis/mass. Other smaller right-sided pulmonary nodules which are all pleural-based and most likely represent pleural metastasis. 2. No convincing evidence of abdominopelvic nodal metastasis. 3. Constellation of findings, including pancreatic atrophy, duct dilatation, and pancreatic head soft tissue fullness which are highly suspicious for pancreatic adenocarcinoma. Metastatic disease felt much less likely. Consider endoscopic ultrasound sampling or ERCP. Cannot exclude superimposed acute pancreatitis. 4. New enlargement of the appendiceal tip with subtle surrounding edema. Cannot exclude early or mild appendicitis. 5. Coronary artery atherosclerosis. Aortic atherosclerosis. 6. 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    06/13/2016 Progression    CT scan demonstrates progression of disease    06/19/2016 Procedure    EUS with  FNA by Dr. Ardis Hughs    06/20/2016 Pathology Results    FINE NEEDLE ASPIRATION, ENDOSCOPIC, PANCREAS UNCINATE AREA(SPECIMEN 1 OF 1 COLLECTED 06/19/16): MALIGNANT CELLS  CONSISTENT WITH METASTATIC ADENOCARCINOMA.    06/25/2016 PET scan    1. Two pleural-based nodules in the right hemithorax are hypermetabolic. Metastatic disease is a distinct consideration. The scattered pulmonary parenchymal nodule seen on previous diagnostic CT imaging are below the threshold for reliable resolution on PET imaging. 2. Hypermetabolic lesion pancreatic head, consistent with neoplasm. As noted on prior CT, adenocarcinoma is any consideration. No evidence for hypermetabolic abdominal lymphadenopathy. Mottled uptake noted in the liver, but no discrete hepatic metastases are evident on PET imaging. 3. Appendix remains distended up to 0.9-10 mm diameter with a stone towards the tip in subtle periappendiceal edema/inflammation. The appendix is hypermetabolic along its length. Imaging features are relatively stable in the 12 day interval since prior CT scan. While appendicitis is a consideration, the relative stability over 12 days would be unusual for that etiology. Continued close follow-up recommended.    06/26/2016 Pathology Results    Not enough tissue for foundationONE or K-ras testing.    07/07/2016 Procedure    Port placed by Dr. Arnoldo Morale    07/08/2016 -  Chemotherapy    The patient had pegfilgrastim (NEULASTA) injection 6 mg, 6 mg, Subcutaneous,  Once, 0 of 4 cycles  ondansetron (ZOFRAN) IVPB 8 mg, 8 mg, Intravenous,  Once, 0 of 4 cycles  leucovorin 804 mg in dextrose 5 % 250 mL infusion, 400 mg/m2, Intravenous,  Once, 0 of 4 cycles  oxaliplatin (ELOXATIN) 170 mg in dextrose 5 % 500 mL chemo infusion, 85 mg/m2, Intravenous,  Once, 0 of 4 cycles  fluorouracil (ADRUCIL) 4,800 mg in sodium chloride 0.9 % 150 mL chemo infusion, 2,400 mg/m2 = 4,800 mg, Intravenous, 1 Day/Dose, 0 of 4 cycles  fluorouracil (ADRUCIL) chemo injection 800 mg, 400 mg/m2, Intravenous,  Once, 0 of 4 cycles  pegfilgrastim (NEULASTA) injection 6 mg, 6 mg, Subcutaneous,  Once, 2 of 2 cycles   ondansetron (ZOFRAN) IVPB 8 mg, 8 mg, Intravenous,  Once, 12 of 12 cycles  leucovorin 660 mg in dextrose 5 % 250 mL infusion, 660 mg (original dose ), Intravenous,  Once, 12 of 12 cycles Dose modification: 660 mg (Cycle 1)  oxaliplatin (ELOXATIN) 140 mg in dextrose 5 % 500 mL chemo infusion, 140 mg (original dose ), Intravenous,  Once, 12 of 12 cycles Dose modification: 140 mg (Cycle 1), 119 mg (85 % of original dose 140 mg, Cycle 8, Reason: Provider Judgment)  fluorouracil (ADRUCIL) 3,850 mg in sodium chloride 0.9 % 150 mL chemo infusion, 3,850 mg (original dose ), Intravenous, 1 Day/Dose, 12 of 12 cycles Dose modification: 3,850 mg (Cycle 1)  fluorouracil (ADRUCIL) chemo injection 700 mg, 700 mg (original dose ), Intravenous,  Once, 12 of 12 cycles Dose modification: 700 mg (Cycle 1)  palonosetron (ALOXI) injection 0.25 mg, 0.25 mg, Intravenous,  Once, 7 of 12 cycles Administration: 0.25 mg (09/30/2016)  bevacizumab (AVASTIN) 475 mg in sodium chloride 0.9 % 100 mL chemo infusion, 5 mg/kg = 475 mg, Intravenous,  Once, 6 of 11 cycles Administration: 500 mg (09/30/2016)  leucovorin 880 mg in dextrose 5 % 250 mL infusion, 400 mg/m2 = 880 mg, Intravenous,  Once, 7 of 12 cycles Administration: 880 mg (09/30/2016)  oxaliplatin (ELOXATIN) 185 mg in dextrose 5 % 500 mL chemo infusion, 85 mg/m2 = 185 mg, Intravenous,  Once, 7 of 12 cycles Administration: 185 mg (  09/30/2016)  fluorouracil (ADRUCIL) chemo injection 900 mg, 400 mg/m2 = 900 mg, Intravenous,  Once, 7 of 12 cycles Administration: 900 mg (09/30/2016)  fluorouracil (ADRUCIL) 5,300 mg in sodium chloride 0.9 % 144 mL chemo infusion, 2,400 mg/m2 = 5,300 mg, Intravenous, 1 Day/Dose, 7 of 12 cycles Administration: 5,300 mg (09/30/2016)  for chemotherapy treatment.      08/27/2016 Imaging    CT CAP- 1. Pulmonary metastatic disease is stable. 2. Pancreatic head mass, grossly stable, with progressive atrophy of the body and tail of the  pancreas. Stable peripancreatic lymph node. 3. Mild circumferential rectal wall thickening, stable. 4. Aortic atherosclerosis (ICD10-170.0). Coronary artery calcification. 5. Hyperattenuating lesion off the lower pole left kidney, stable, too small to characterize. Continued attention on followup exams is warranted. 6. Persistent wall thickening and mild dilatation of the proximal appendix, of uncertain etiology. Continued attention on followup exams is warranted.    11/06/2016 Imaging    CT C/A/P: IMPRESSION: 1. Stable pulmonary metastatic disease. 2. Similar appearing pancreatic head mass. Progressed atrophy of the pancreatic body and tail. Stable peripancreatic lymph node. 3. Stable hypoattenuating lesion partially exophytic off the inferior pole of the left kidney. 4. Aortic atherosclerosis. 5. Persistent mild wall thickening and mild dilatation of the appendix.     01/30/2017 Imaging    Ct C/A/P: IMPRESSION: 1. Stable pulmonary metastatic disease. 2. Stable to slight increase in size of pancreatic head mass. 3.  Aortic Atherosclerosis (ICD10-I70.0). 4. Similar appearance of mild wall thickening of the appendix which contains an appendicolith.    05/01/2017 Imaging    CT C/A/P: Stable disease in the lungs, no progression of disease in the abdomen.    10/04/2018 -  Chemotherapy    The patient had PALONOSETRON HCL INJECTION 0.25 MG/5ML, 0.25 mg, Intravenous,  Once, 0 of 4 cycles bevacizumab (AVASTIN) 450 mg in sodium chloride 0.9 % 100 mL chemo infusion, 5 mg/kg, Intravenous,  Once, 0 of 4 cycles irinotecan (CAMPTOSAR) 380 mg in dextrose 5 % 500 mL chemo infusion, 180 mg/m2, Intravenous,  Once, 0 of 4 cycles leucovorin 856 mg in dextrose 5 % 250 mL infusion, 400 mg/m2, Intravenous,  Once, 0 of 4 cycles fluorouracil (ADRUCIL) chemo injection 850 mg, 400 mg/m2, Intravenous,  Once, 0 of 4 cycles fluorouracil (ADRUCIL) 5,150 mg in sodium chloride 0.9 % 147 mL chemo infusion, 2,400  mg/m2, Intravenous, 1 Day/Dose, 0 of 4 cycles  for chemotherapy treatment.       CANCER STAGING: Cancer Staging Rectal cancer ([place]) Staging form: Colon and Rectum, AJCC 7th Edition - Clinical: Stage IIIB (T3, N1, M0) - Signed by [name], MD on 02/04/2011 - Pathologic stage from 11/17/2014: Stage IVA (M1a) - Signed by Baird Cancer, PA-C on 09/14/2015    INTERVAL HISTORY:  Mr. Rape 68 y.o. male returns for routine follow-up. He is here today alone. He states that he has felt much better since his last visit. The pain in his upper abdomen has gone completely. He states that he continues to experience chest pain at times, worse when lying down. Denies any nausea, vomiting, or diarrhea. Denies any new pains. Had not noticed any recent bleeding such as epistaxis, hematuria or hematochezia. Denies recent chest pain on exertion, shortness of breath on minimal exertion, pre-syncopal episodes, or palpitations. Denies any numbness or tingling in hands or feet. Denies any recent fevers, infections, or recent hospitalizations. Patient reports appetite at 50% and energy level at 25%.   REVIEW OF SYSTEMS:  Review of Systems  Cardiovascular: Positive for chest pain.     PAST MEDICAL/SURGICAL HISTORY:  Past Medical History:  Diagnosis Date  . Chronic anticoagulation   . Colon cancer ([place]) 07/24/2010   rectal ca, inv adenocarcinoma  . Depression 04/01/2011  . Diabetes mellitus without complication ([place])   . DVT (deep venous thrombosis) ([place]) 05/09/2011  . GERD (gastroesophageal reflux disease)   . High output ileostomy ([place]) 05/21/2011  . History of kidney stones   . HTN (hypertension)   . Hx of radiation therapy 09/02/10 to 10/14/10   pelvis  . Lung metastasis ([place])   . Neuropathy   . Peripheral vascular disease ([place])    dvt's,pe  . Pneumonia    hx x3  . Pulmonary embolism ([place])   . Rectal cancer ([place]) 01/15/2011   S/P radiation and concurrent 5-FU continuous infusion from  09/09/10- 10/10/10.  S/P proctectomy with colorectal anastomosis and diverting loop ileostomy on 11/14/10 at [facility] by Dr. Harlon Ditty. Pathology reveals a pT3b N1 with 3/20 lymph nodes.     Past Surgical History:  Procedure Laterality Date  . BILIARY STENT PLACEMENT N/A 09/16/2018   Procedure: STENT PLACEMENT;  Surgeon: Rogene Houston, MD;  [facility];  Service: Endoscopy;  Laterality: N/A;  . COLON SURGERY  11/14/2010   proctectomy with colorectal anastomosis and diverting loop ileostomy (temporary planned)  . COLONOSCOPY  07/2010   proximal rectal apple core mass 10-14cm from anal verge (adenocarcinoma), 2-3cm distal rectal carpet polyp s/p piecemeal snare polypectomy (adenoma)  . COLONOSCOPY  04/21/2012   RMR: Friable,fibrotic appearing colorectal anastomosis producing some luminal narrowing-not felt to be critical. path: focal erosion with slight inflammation and hyperemia. SURVEILLANCE DUE DEC 2015  . COLONOSCOPY N/A 11/24/2013   Dr. Rourk:somewhat fibrotic/friable anastomotic mucosal-status post biopsy (narrowing not felt to be clinically significant) Single colonic diverticulum. benign polypoid rectal mucosa  . colostomy reversal  april 2013  . ERCP N/A 09/16/2018   Procedure: ENDOSCOPIC RETROGRADE CHOLANGIOPANCREATOGRAPHY (ERCP);  Surgeon: Rogene Houston, MD;  [facility];  Service: Endoscopy;  Laterality: N/A;  . ESOPHAGOGASTRODUODENOSCOPY  07/2010   RMR: schatki ring s/p dilation, small hh, SB bx benign  . ESOPHAGOGASTRODUODENOSCOPY N/A 09/04/2015   Procedure: ESOPHAGOGASTRODUODENOSCOPY (EGD);  Surgeon: Daneil Dolin, MD;  [facility];  Service: Endoscopy;  Laterality: N/A;  730  . EUS  08/2010   Dr. Owens Loffler. uT3N0 circumferential, nearly obstruction rectosigmoid adenocarcinoma, distal edge 12cm from anal verge  . EUS N/A 06/19/2016   Procedure: UPPER ENDOSCOPIC ULTRASOUND (EUS) RADIAL;  Surgeon: Milus Banister, MD;  Location: [facility];   Service: Endoscopy;  Laterality: N/A;  . HERNIA REPAIR     abd hernia repair  . IVC filter    . ivc filter    . port a cath placement    . PORT-A-CATH REMOVAL  09/24/2011   Procedure: REMOVAL PORT-A-CATH;  Surgeon: Donato Heinz, MD;  Location: [facility] OR[facility];  Service: General;  Laterality: N/A;  Minor Room  . PORTACATH PLACEMENT Right 07/07/2016   Procedure: INSERTION PORT-A-CATH;  Surgeon: Aviva Signs, MD;  Location: [facility];  Service: General;  Laterality: Right;  . SPHINCTEROTOMY N/A 09/16/2018   Procedure: SPHINCTEROTOMY with balloon dialation;  Surgeon: Rogene Houston, MD;  [facility];  Service: Endoscopy;  Laterality: N/A;  . VIDEO ASSISTED THORACOSCOPY (VATS)/WEDGE RESECTION Right 11/15/2014   Procedure: VIDEO ASSISTED THORACOSCOPY (VATS)/LUNG RESECTION WITH RIGHT LINGULECTOMY;  Surgeon: [name], MD;  Location: [facility];  Service: Thoracic;  Laterality: Right;  Marland Kitchen VIDEO BRONCHOSCOPY N/A 11/15/2014   Procedure: VIDEO BRONCHOSCOPY;  Surgeon: [name], MD;  Location[facility];  Service: Thoracic;  Laterality: N/A;     SOCIAL HISTORY:  Social History   Socioeconomic History  . Marital status: Married    Spouse name: Not on file  . Number of children: 2  . Years of education: Not on file  . Highest education level: Not on file  Occupational History  . Occupation: self-employed Regulatory affairs officer, currently not working    Fish farm manager: SELF EMPLOYED  Social Needs  . Financial resource strain: Not on file  . Food insecurity:    Worry: Not on file    Inability: Not on file  . Transportation needs:    Medical: Not on file    Non-medical: Not on file  Tobacco Use  . Smoking status: Never Smoker  . Smokeless tobacco: Never Used  Substance and Sexual Activity  . Alcohol use: No  . Drug use: No  . Sexual activity: Yes    Birth control/protection: None    Comment: married  Lifestyle  . Physical activity:    Days per week: Not on file    Minutes per  session: Not on file  . Stress: Not on file  Relationships  . Social connections:    Talks on phone: Not on file    Gets together: Not on file    Attends religious service: Not on file    Active member of club or organization: Not on file    Attends meetings of clubs or organizations: Not on file    Relationship status: Not on file  . Intimate partner violence:    Fear of current or ex partner: Not on file    Emotionally abused: Not on file    Physically abused: Not on file    Forced sexual activity: Not on file  Other Topics Concern  . Not on file  Social History Narrative  . Not on file    FAMILY HISTORY:  Family History  Problem Relation Age of Onset  . Cancer Brother        throat  . Cancer Brother        prostate  . Colon cancer Neg Hx   . Liver disease Neg Hx   . Inflammatory bowel disease Neg Hx     CURRENT MEDICATIONS:  Outpatient Encounter Medications as of 09/20/2018  Medication Sig Note  . acetaminophen (TYLENOL) 500 MG tablet Take 1,000 mg by mouth every 6 (six) hours as needed.   . clonazePAM (KLONOPIN) 0.5 MG tablet Take 1 tablet (0.5 mg total) by mouth 2 (two) times daily as needed.   . diphenoxylate-atropine (LOMOTIL) 2.5-0.025 MG tablet Take 1 tablet by mouth 4 (four) times daily as needed for diarrhea or loose stools.   . gabapentin (NEURONTIN) 300 MG capsule Take 3 capsules (900 mg total) by mouth 2 (two) times daily.   Marland Kitchen glimepiride (AMARYL) 2 MG tablet Take 2 mg by mouth daily with breakfast.    . HYDROcodone-acetaminophen (NORCO) 5-325 MG tablet Take 1-2 tablets by mouth every 6 (six) hours as needed for moderate pain.   . mirtazapine (REMERON) 7.5 MG tablet Take 1 tablet (7.5 mg total) by mouth at bedtime.   Marland Kitchen omeprazole (PRILOSEC) 20 MG capsule Take 20 mg by mouth 2 (two) times daily before a meal.    . ondansetron (ZOFRAN) 8 MG tablet Take 1 tablet (8 mg total) by mouth  2 (two) times daily as needed for refractory nausea / vomiting. Start on day 3  after chemotherapy.   . Probiotic Product (PROBIOTIC DAILY PO) Take 1 capsule by mouth daily.   . rivaroxaban (XARELTO) 20 MG TABS tablet Take 1 tablet (20 mg total) by mouth daily with supper.   . tamsulosin (FLOMAX) 0.4 MG CAPS capsule Take 0.4 mg by mouth daily.   . traZODone (DESYREL) 100 MG tablet Take 1 tablet (100 mg total) by mouth at bedtime.   . vitamin B-12 (CYANOCOBALAMIN) 1000 MCG tablet Take 1,000 mcg by mouth daily.   . [DISCONTINUED] traZODone (DESYREL) 100 MG tablet Take 100 mg by mouth at bedtime.   . [DISCONTINUED] capecitabine (XELODA) 500 MG tablet TAKE 3 TABLETS BY MOUTH TWICE DAILY FOR 14 DAYS IN A ROW FOLLOWED BY A 7 DAY REST PERIOD. TAKE AFTER MEAL (Patient not taking: Reported on 09/20/2018) 09/14/2018: ON HOLD UNTIL FURTHER NOTICE   . [DISCONTINUED] Diphenhyd-Hydrocort-Nystatin (FIRST-DUKES MOUTHWASH) SUSP Use as directed 5 mLs in the mouth or throat 4 (four) times daily. (Patient not taking: Reported on 09/20/2018)   . [DISCONTINUED] lipase/protease/amylase (CREON) 36000 UNITS CPEP capsule Take 2 capsules (72,000 Units total) by mouth 3 (three) times daily with meals. Take one capsule with snacks (Patient not taking: Reported on 09/14/2018)   . [DISCONTINUED] lisinopril (PRINIVIL,ZESTRIL) 10 MG tablet Take 10 mg by mouth 2 (two) times a day.    . [DISCONTINUED] magnesium oxide (MAG-OX) 400 (241.3 Mg) MG tablet Take 1 tablet (400 mg total) by mouth 2 (two) times daily.   . [DISCONTINUED] prochlorperazine (COMPAZINE) 10 MG tablet Take 1 tablet (10 mg total) by mouth every 6 (six) hours as needed (Nausea or vomiting).    Facility-Administered Encounter Medications as of 09/20/2018  Medication  . sodium chloride 0.9 % injection 10 mL  . sodium chloride flush (NS) 0.9 % injection 10 mL    ALLERGIES:  Allergies  Allergen Reactions  . Oxycodone     Blisters, hallucinations  . Tramadol     Blisters, hallucinations  . Trazodone And Nefazodone Other (See Comments)     hallucinations     PHYSICAL EXAM:  ECOG Performance status: 1  Vitals:   09/20/18 1000  BP: (!) 156/82  Pulse: 88  Resp: 16  Temp: (!) 97.4 F (36.3 C)  SpO2: 100%   Filed Weights   09/20/18 1000  Weight: 193 lb 8 oz (87.8 kg)    Physical Exam Vitals signs reviewed.  Constitutional:      Appearance: Normal appearance.  Cardiovascular:     Rate and Rhythm: Normal rate and regular rhythm.     Heart sounds: Normal heart sounds.  Pulmonary:     Effort: Pulmonary effort is normal.     Breath sounds: Normal breath sounds.  Abdominal:     General: There is no distension.     Palpations: Abdomen is soft. There is no mass.  Musculoskeletal:        General: No swelling.  Skin:    General: Skin is warm.  Neurological:     General: No focal deficit present.     Mental Status: He is alert and oriented to person, place, and time.  Psychiatric:        Mood and Affect: Mood normal.        Behavior: Behavior normal.      LABORATORY DATA:  I have reviewed the labs as listed.  CBC    Component Value Date/Time   WBC 4.7  09/10/2018 0812   RBC 3.75 (L) 09/10/2018 0812   HGB 12.4 (L) 09/10/2018 0812   HGB 11.6 (L) 02/04/2011 1059   HCT 38.4 (L) 09/10/2018 0812   HCT 34.7 (L) 02/04/2011 1059   PLT 177 09/10/2018 0812   PLT 282 02/04/2011 1059   MCV 102.4 (H) 09/10/2018 0812   MCV 76.0 (L) 02/04/2011 1059   MCH 33.1 09/10/2018 0812   MCHC 32.3 09/10/2018 0812   RDW 15.5 09/10/2018 0812   RDW 18.5 (H) 02/04/2011 1059   LYMPHSABS 1.5 09/10/2018 0812   LYMPHSABS 1.1 02/04/2011 1059   MONOABS 0.5 09/10/2018 0812   MONOABS 0.5 02/04/2011 1059   EOSABS 0.2 09/10/2018 0812   EOSABS 0.1 02/04/2011 1059   BASOSABS 0.0 09/10/2018 0812   BASOSABS 0.1 02/04/2011 1059   CMP Latest Ref Rng & Units 09/20/2018 09/10/2018 08/19/2018  Glucose 70 - 99 mg/dL - 193(H) 190(H)  BUN 8 - 23 mg/dL - 11 11  Creatinine 0.61 - 1.24 mg/dL - 0.61 0.66  Sodium 135 - 145 mmol/L - 141 136   Potassium 3.5 - 5.1 mmol/L - 3.5 3.7  Chloride 98 - 111 mmol/L - 111 106  CO2 22 - 32 mmol/L - 23 23  Calcium 8.9 - 10.3 mg/dL - 8.5(L) 8.7(L)  Total Protein 6.5 - 8.1 g/dL 6.5 5.8(L) 6.1(L)  Total Bilirubin 0.3 - 1.2 mg/dL 2.3(H) 3.4(H) 1.1  Alkaline Phos 38 - 126 U/L 271(H) 348(H) 90  AST 15 - 41 U/L 32 290(H) 39  ALT 0 - 44 U/L 78(H) 332(H) 31       DIAGNOSTIC IMAGING:  I have independently reviewed the scans and discussed with the patient.   I have reviewed Venita Lick LPN's note and agree with the documentation.  I personally performed a face-to-face visit, made revisions and my assessment and plan is as follows.    ASSESSMENT & PLAN:   Rectal cancer 1.  Stage IV rectal cancer with lung and pancreatic metastasis: -Maintenance Xeloda started on 08/07/2017 with bevacizumab, dose reduced to 1500 mg twice daily during cycle 2.  Prior to that he was on 5-FU maintenance. -CT CAP on 07/30/2018 showed no significant progressive metastatic disease.  No significant change in the right upper lobe solid pulmonary nodules.  Stable mixed lytic and sclerotic lower sternal lesion.  Stable malignant pancreatic head mass. -MRCP showed intrahepatic and extrahepatic biliary dilatation with stricture at common bile duct.  Mass in the head of the pancreas has increased in size to 2.1 cm, previously 1.7 cm. -ERCP and stent placement on 09/16/2018, showing a single localized biliary stricture.  Biliary sphincterotomy was also performed and stricture dilated to 6 mm with the balloon to facilitate stent placement.  1 covered metal stent was placed. -Pain in the abdomen has completely improved after stent placement. - His last CEA also increased to 15.9.  Because of progression, I have recommended change in therapy. - I will start him on FOLFIRI with bevacizumab.  We will plan to scan him after 2 to 3 months.  We talked about side effects in detail.  2.  Peripheral neuropathy: -We will continue  gabapentin 900 mg twice daily.    3.  DVT and PE: -Diagnosed in 2016, status post IVC filter. -Xarelto was temporarily held.  He started back on it.  4.  Rectal pain: -He will continue hydrocodone at bedtime as needed.  5.  Sleep problems: -He complained of difficulty falling asleep.  We will restart him on trazodone 100  mg at bedtime.     Total time spent is 40 minutes with more than 50% of the time spent face-to-face discussing change in therapy, side effects and coordination of care.  Orders placed this encounter:  Orders Placed This Encounter  Procedures  . Hepatic function panel      Charles Jack, MD [place] [phone]

## 2018-09-20 NOTE — Assessment & Plan Note (Addendum)
1.  Stage IV rectal cancer with lung and pancreatic metastasis: -Maintenance Xeloda started on 08/07/2017 with bevacizumab, dose reduced to 1500 mg twice daily during cycle 2.  Prior to that he was on 5-FU maintenance. -CT CAP on 07/30/2018 showed no significant progressive metastatic disease.  No significant change in the right upper lobe solid pulmonary nodules.  Stable mixed lytic and sclerotic lower sternal lesion.  Stable malignant pancreatic head mass. -MRCP showed intrahepatic and extrahepatic biliary dilatation with stricture at common bile duct.  Mass in the head of the pancreas has increased in size to 2.1 cm, previously 1.7 cm. -ERCP and stent placement on 09/16/2018, showing a single localized biliary stricture.  Biliary sphincterotomy was also performed and stricture dilated to 6 mm with the balloon to facilitate stent placement.  1 covered metal stent was placed. -Pain in the abdomen has completely improved after stent placement. - His last CEA also increased to 15.9.  Because of progression, I have recommended change in therapy. - I will start him on FOLFIRI with bevacizumab.  We will plan to scan him after 2 to 3 months.  We talked about side effects in detail.  2.  Peripheral neuropathy: -We will continue gabapentin 900 mg twice daily.    3.  DVT and PE: -Diagnosed in 2016, status post IVC filter. -Xarelto was temporarily held.  He started back on it.  4.  Rectal pain: -He will continue hydrocodone at bedtime as needed.  5.  Sleep problems: -He complained of difficulty falling asleep.  We will restart him on trazodone 100 mg at bedtime.

## 2018-09-20 NOTE — Patient Instructions (Addendum)
[place] at [facility] Discharge Instructions  You were seen today by Dr. [name]. He went over your recent lab results. He is going to start you on  treatment called FOLIRI and Avastin. He will see you back in 1 week for labs and follow up.   Thank you for choosing [place] at [facility] to provide your oncology and hematology care.  To afford each patient quality time with our provider, please arrive at least 15 minutes before your scheduled appointment time.   If you have a lab appointment with the [place] [facility] please come in thru the  Main Entrance and check in at the main information desk  You need to re-schedule your appointment should you arrive 10 or more minutes late.  We strive to give you quality time with our providers, and arriving late affects you and other patients whose appointments are after yours.  Also, if you no show three or more times for appointments you may be dismissed from the clinic at the providers discretion.     Again, thank you for choosing [facility].  Our hope is that these requests will decrease the amount of time that you wait before being seen by our physicians.       _____________________________________________________________  Should you have questions after your visit to [facility], please contact our office at [phone] between the hours of 8:00 a.m. and 4:30 p.m.  Voicemails left after 4:00 p.m. will not be returned until the following business day.  For prescription refill requests, have your pharmacy contact our office and allow 72 hours.    Cancer Center Support Programs:   > Cancer Support Group  2nd Tuesday of the month 1pm-2pm, Journey Room

## 2018-09-20 NOTE — Progress Notes (Signed)
START ON PATHWAY REGIMEN - Colorectal     A cycle is every 14 days:     Irinotecan      Leucovorin      5-Fluorouracil      5-Fluorouracil      Bevacizumab-xxxx   **Always confirm dose/schedule in your pharmacy ordering system**  Patient Characteristics: Distant Metastases, Second Line, KRAS Mutation Positive/Unknown, BRAF Wild-Type/Unknown, Bevacizumab Eligible Therapeutic Status: Distant Metastases BRAF Mutation Status: Awaiting Test Results KRAS/NRAS Mutation Status: Awaiting Test Results Line of Therapy: Second Line  Intent of Therapy: Non-Curative / Palliative Intent, Discussed with Patient 

## 2018-09-21 ENCOUNTER — Telehealth (HOSPITAL_COMMUNITY): Payer: Self-pay | Admitting: *Deleted

## 2018-09-21 ENCOUNTER — Encounter (HOSPITAL_COMMUNITY): Payer: Self-pay | Admitting: *Deleted

## 2018-09-21 MED ORDER — OXYCODONE-ACETAMINOPHEN 10-325 MG PO TABS: 1.0000 | ORAL_TABLET | Freq: Four times a day (QID) | ORAL | 0 refills | Status: DC | PRN

## 2018-09-21 NOTE — Telephone Encounter (Signed)
Pt's wife called stating Lorena was in a lot of pain (abdominal) and that his pain medication is not working very well - he is currently taking Norco 5/325 1-2 tablets q 6 h prn pain.  She states it takes 2 hours for the pain meds to begin working when he takes them, and reports that the pain relief only lasts an hour or two after that.  Notified Dr. [name].  He will send in Percocet 10/325 1 tab q 6 hours prn pain.  Informed pt's wife of new Rx and instructed to call the clinic if this is not helping his pain.  She verbalizes understanding.

## 2018-09-21 NOTE — Progress Notes (Signed)
I spoke with Charles [name] in pathology and ordered foundation one and MSI testing.  C20, C18.9 Stage IV.

## 2018-09-21 NOTE — Addendum Note (Signed)
Addended by: Derek Jack on: 09/21/2018 10:32 AM   Modules accepted: Orders

## 2018-09-22 ENCOUNTER — Emergency Department (HOSPITAL_COMMUNITY): Payer: Medicare Other

## 2018-09-22 ENCOUNTER — Other Ambulatory Visit: Payer: Self-pay

## 2018-09-22 ENCOUNTER — Inpatient Hospital Stay (HOSPITAL_COMMUNITY)
Admission: EM | Admit: 2018-09-22 | Discharge: 2018-10-01 | DRG: 330 | Disposition: A | Discharge status: Home Health | Payer: Medicare Other | Attending: Family Medicine | Admitting: Family Medicine

## 2018-09-22 ENCOUNTER — Encounter (HOSPITAL_COMMUNITY): Payer: Self-pay | Admitting: Emergency Medicine

## 2018-09-22 DIAGNOSIS — E119 Type 2 diabetes mellitus without complications: Secondary | ICD-10-CM

## 2018-09-22 DIAGNOSIS — Z86711 Personal history of pulmonary embolism: Secondary | ICD-10-CM

## 2018-09-22 DIAGNOSIS — C20 Malignant neoplasm of rectum: Secondary | ICD-10-CM

## 2018-09-22 DIAGNOSIS — R112 Nausea with vomiting, unspecified: Secondary | ICD-10-CM | POA: Diagnosis not present

## 2018-09-22 DIAGNOSIS — K91858 Other complications of intestinal pouch: Principal | ICD-10-CM | POA: Diagnosis present

## 2018-09-22 DIAGNOSIS — K624 Stenosis of anus and rectum: Secondary | ICD-10-CM | POA: Diagnosis present

## 2018-09-22 DIAGNOSIS — C19 Malignant neoplasm of rectosigmoid junction: Secondary | ICD-10-CM | POA: Diagnosis not present

## 2018-09-22 DIAGNOSIS — E860 Dehydration: Secondary | ICD-10-CM | POA: Diagnosis present

## 2018-09-22 DIAGNOSIS — R1013 Epigastric pain: Secondary | ICD-10-CM

## 2018-09-22 DIAGNOSIS — Z79899 Other long term (current) drug therapy: Secondary | ICD-10-CM

## 2018-09-22 DIAGNOSIS — I700 Atherosclerosis of aorta: Secondary | ICD-10-CM | POA: Diagnosis present

## 2018-09-22 DIAGNOSIS — R079 Chest pain, unspecified: Secondary | ICD-10-CM | POA: Diagnosis not present

## 2018-09-22 DIAGNOSIS — R933 Abnormal findings on diagnostic imaging of other parts of digestive tract: Secondary | ICD-10-CM

## 2018-09-22 DIAGNOSIS — G620 Drug-induced polyneuropathy: Secondary | ICD-10-CM | POA: Diagnosis present

## 2018-09-22 DIAGNOSIS — K828 Other specified diseases of gallbladder: Secondary | ICD-10-CM | POA: Diagnosis present

## 2018-09-22 DIAGNOSIS — Z923 Personal history of irradiation: Secondary | ICD-10-CM

## 2018-09-22 DIAGNOSIS — Z7983 Long term (current) use of bisphosphonates: Secondary | ICD-10-CM

## 2018-09-22 DIAGNOSIS — I2510 Atherosclerotic heart disease of native coronary artery without angina pectoris: Secondary | ICD-10-CM | POA: Diagnosis present

## 2018-09-22 DIAGNOSIS — Z1159 Encounter for screening for other viral diseases: Secondary | ICD-10-CM | POA: Diagnosis not present

## 2018-09-22 DIAGNOSIS — A040 Enteropathogenic Escherichia coli infection: Secondary | ICD-10-CM | POA: Diagnosis not present

## 2018-09-22 DIAGNOSIS — C189 Malignant neoplasm of colon, unspecified: Secondary | ICD-10-CM | POA: Diagnosis present

## 2018-09-22 DIAGNOSIS — E1151 Type 2 diabetes mellitus with diabetic peripheral angiopathy without gangrene: Secondary | ICD-10-CM | POA: Diagnosis present

## 2018-09-22 DIAGNOSIS — Z886 Allergy status to analgesic agent status: Secondary | ICD-10-CM

## 2018-09-22 DIAGNOSIS — C250 Malignant neoplasm of head of pancreas: Secondary | ICD-10-CM | POA: Diagnosis present

## 2018-09-22 DIAGNOSIS — R197 Diarrhea, unspecified: Secondary | ICD-10-CM

## 2018-09-22 DIAGNOSIS — T451X5A Adverse effect of antineoplastic and immunosuppressive drugs, initial encounter: Secondary | ICD-10-CM | POA: Diagnosis present

## 2018-09-22 DIAGNOSIS — K219 Gastro-esophageal reflux disease without esophagitis: Secondary | ICD-10-CM | POA: Diagnosis present

## 2018-09-22 DIAGNOSIS — Z7901 Long term (current) use of anticoagulants: Secondary | ICD-10-CM

## 2018-09-22 DIAGNOSIS — Z85048 Personal history of other malignant neoplasm of rectum, rectosigmoid junction, and anus: Secondary | ICD-10-CM

## 2018-09-22 DIAGNOSIS — Z8042 Family history of malignant neoplasm of prostate: Secondary | ICD-10-CM

## 2018-09-22 DIAGNOSIS — E876 Hypokalemia: Secondary | ICD-10-CM | POA: Diagnosis present

## 2018-09-22 DIAGNOSIS — I10 Essential (primary) hypertension: Secondary | ICD-10-CM | POA: Diagnosis present

## 2018-09-22 DIAGNOSIS — K913 Postprocedural intestinal obstruction, unspecified as to partial versus complete: Secondary | ICD-10-CM

## 2018-09-22 DIAGNOSIS — Z885 Allergy status to narcotic agent status: Secondary | ICD-10-CM

## 2018-09-22 DIAGNOSIS — Z809 Family history of malignant neoplasm, unspecified: Secondary | ICD-10-CM

## 2018-09-22 DIAGNOSIS — I82409 Acute embolism and thrombosis of unspecified deep veins of unspecified lower extremity: Secondary | ICD-10-CM | POA: Diagnosis present

## 2018-09-22 DIAGNOSIS — R509 Fever, unspecified: Secondary | ICD-10-CM

## 2018-09-22 DIAGNOSIS — C7889 Secondary malignant neoplasm of other digestive organs: Secondary | ICD-10-CM | POA: Diagnosis present

## 2018-09-22 LAB — CBC WITH DIFFERENTIAL/PLATELET
Abs Immature Granulocytes: 0.03 10*3/uL (ref 0.00–0.07)
Basophils Absolute: 0 10*3/uL (ref 0.0–0.1)
Basophils Relative: 0 %
Eosinophils Absolute: 0 10*3/uL (ref 0.0–0.5)
Eosinophils Relative: 0 %
HCT: 40.6 % (ref 39.0–52.0)
Hemoglobin: 13.6 g/dL (ref 13.0–17.0)
Immature Granulocytes: 0 %
Lymphocytes Relative: 10 %
Lymphs Abs: 0.9 10*3/uL (ref 0.7–4.0)
MCH: 33.3 pg (ref 26.0–34.0)
MCHC: 33.5 g/dL (ref 30.0–36.0)
MCV: 99.3 fL (ref 80.0–100.0)
Monocytes Absolute: 0.7 10*3/uL (ref 0.1–1.0)
Monocytes Relative: 8 %
Neutro Abs: 7.3 10*3/uL (ref 1.7–7.7)
Neutrophils Relative %: 82 %
Platelets: 236 10*3/uL (ref 150–400)
RBC: 4.09 MIL/uL — ABNORMAL LOW (ref 4.22–5.81)
RDW: 14.2 % (ref 11.5–15.5)
WBC: 9 10*3/uL (ref 4.0–10.5)
nRBC: 0 % (ref 0.0–0.2)

## 2018-09-22 LAB — PHOSPHORUS: Phosphorus: 3.4 mg/dL (ref 2.5–4.6)

## 2018-09-22 LAB — COMPREHENSIVE METABOLIC PANEL
ALT: 50 U/L — ABNORMAL HIGH (ref 0–44)
AST: 29 U/L (ref 15–41)
Albumin: 3.4 g/dL — ABNORMAL LOW (ref 3.5–5.0)
Alkaline Phosphatase: 237 U/L — ABNORMAL HIGH (ref 38–126)
Anion gap: 12 (ref 5–15)
BUN: 9 mg/dL (ref 8–23)
CO2: 20 mmol/L — ABNORMAL LOW (ref 22–32)
Calcium: 8.7 mg/dL — ABNORMAL LOW (ref 8.9–10.3)
Chloride: 106 mmol/L (ref 98–111)
Creatinine, Ser: 0.66 mg/dL (ref 0.61–1.24)
GFR calc Af Amer: >60 mL/min (ref >60)
GFR calc non Af Amer: >60 mL/min (ref >60)
Glucose, Bld: 203 mg/dL — ABNORMAL HIGH (ref 70–99)
Potassium: 3.4 mmol/L — ABNORMAL LOW (ref 3.5–5.1)
Sodium: 138 mmol/L (ref 135–145)
Total Bilirubin: 2.3 mg/dL — ABNORMAL HIGH (ref 0.3–1.2)
Total Protein: 6.2 g/dL — ABNORMAL LOW (ref 6.5–8.1)

## 2018-09-22 LAB — LIPASE, BLOOD: Lipase: 17 U/L (ref 11–51)

## 2018-09-22 LAB — GLUCOSE, CAPILLARY: Glucose-Capillary: 134 mg/dL — ABNORMAL HIGH (ref 70–99)

## 2018-09-22 LAB — MAGNESIUM: Magnesium: 1.5 mg/dL — ABNORMAL LOW (ref 1.7–2.4)

## 2018-09-22 LAB — BRAIN NATRIURETIC PEPTIDE: B Natriuretic Peptide: 37 pg/mL (ref 0.0–100.0)

## 2018-09-22 LAB — SARS CORONAVIRUS 2 BY RT PCR (HOSPITAL ORDER, PERFORMED IN ~~LOC~~ HOSPITAL LAB): SARS Coronavirus 2: NEGATIVE

## 2018-09-22 MED ORDER — INSULIN ASPART 100 UNIT/ML ~~LOC~~ SOLN
0.0000 [IU] | Freq: Three times a day (TID) | SUBCUTANEOUS | Status: DC
  Administered 2018-09-23 (×2): 1 [IU] via SUBCUTANEOUS
  Administered 2018-09-23 – 2018-09-24 (×2): 2 [IU] via SUBCUTANEOUS
  Administered 2018-09-25 – 2018-09-27 (×3): 1 [IU] via SUBCUTANEOUS
  Administered 2018-09-30: 2 [IU] via SUBCUTANEOUS

## 2018-09-22 MED ORDER — MAGNESIUM SULFATE 2 GM/50ML IV SOLN
2.0000 g | Freq: Once | INTRAVENOUS | Status: AC
  Administered 2018-09-22: 2 g via INTRAVENOUS
  Filled 2018-09-22: qty 50

## 2018-09-22 MED ORDER — GLIMEPIRIDE 2 MG PO TABS
2.0000 mg | ORAL_TABLET | Freq: Every day | ORAL | Status: DC
  Administered 2018-09-23: 2 mg via ORAL
  Filled 2018-09-22 (×3): qty 1

## 2018-09-22 MED ORDER — ONDANSETRON HCL 4 MG PO TABS
8.0000 mg | ORAL_TABLET | Freq: Two times a day (BID) | ORAL | Status: DC | PRN
  Filled 2018-09-22: qty 2

## 2018-09-22 MED ORDER — TRAZODONE HCL 50 MG PO TABS
100.0000 mg | ORAL_TABLET | Freq: Every day | ORAL | Status: DC
  Administered 2018-09-22 – 2018-09-30 (×9): 100 mg via ORAL
  Filled 2018-09-22 (×9): qty 2

## 2018-09-22 MED ORDER — SODIUM CHLORIDE 0.9 % IV BOLUS
500.0000 mL | Freq: Once | INTRAVENOUS | Status: AC
  Administered 2018-09-22: 500 mL via INTRAVENOUS

## 2018-09-22 MED ORDER — HYDROMORPHONE HCL 1 MG/ML IJ SOLN
0.5000 mg | INTRAMUSCULAR | Status: DC | PRN
  Administered 2018-09-23 – 2018-09-27 (×4): 0.5 mg via INTRAVENOUS
  Filled 2018-09-22 (×4): qty 0.5

## 2018-09-22 MED ORDER — PROCHLORPERAZINE EDISYLATE 10 MG/2ML IJ SOLN
5.0000 mg | INTRAMUSCULAR | Status: DC | PRN
  Administered 2018-09-25 – 2018-09-28 (×3): 5 mg via INTRAVENOUS
  Filled 2018-09-22 (×4): qty 2

## 2018-09-22 MED ORDER — OXYCODONE-ACETAMINOPHEN 10-325 MG PO TABS: 1.0000 | ORAL_TABLET | Freq: Four times a day (QID) | ORAL | Status: DC | PRN

## 2018-09-22 MED ORDER — PANTOPRAZOLE SODIUM 40 MG PO TBEC
40.0000 mg | DELAYED_RELEASE_TABLET | Freq: Every day | ORAL | Status: DC
  Administered 2018-09-23 – 2018-10-01 (×9): 40 mg via ORAL
  Filled 2018-09-22 (×9): qty 1

## 2018-09-22 MED ORDER — OXYCODONE-ACETAMINOPHEN 5-325 MG PO TABS
1.0000 | ORAL_TABLET | Freq: Four times a day (QID) | ORAL | Status: DC | PRN
  Administered 2018-09-24 – 2018-09-28 (×3): 1 via ORAL
  Filled 2018-09-22 (×5): qty 1

## 2018-09-22 MED ORDER — RIVAROXABAN 20 MG PO TABS
20.0000 mg | ORAL_TABLET | Freq: Every day | ORAL | Status: DC
  Administered 2018-09-22 – 2018-09-24 (×3): 20 mg via ORAL
  Filled 2018-09-22 (×3): qty 1

## 2018-09-22 MED ORDER — BOOST / RESOURCE BREEZE PO LIQD CUSTOM
1.0000 | Freq: Three times a day (TID) | ORAL | Status: DC
  Administered 2018-09-23: 1 via ORAL

## 2018-09-22 MED ORDER — FAMOTIDINE IN NACL 20-0.9 MG/50ML-% IV SOLN
20.0000 mg | Freq: Once | INTRAVENOUS | Status: AC
  Administered 2018-09-22: 20 mg via INTRAVENOUS
  Filled 2018-09-22: qty 50

## 2018-09-22 MED ORDER — GABAPENTIN 300 MG PO CAPS
900.0000 mg | ORAL_CAPSULE | Freq: Two times a day (BID) | ORAL | Status: DC
  Administered 2018-09-22 – 2018-10-01 (×18): 900 mg via ORAL
  Filled 2018-09-22 (×18): qty 3

## 2018-09-22 MED ORDER — OXYCODONE HCL 5 MG PO TABS
5.0000 mg | ORAL_TABLET | Freq: Four times a day (QID) | ORAL | Status: DC | PRN
  Administered 2018-09-23 – 2018-09-27 (×7): 5 mg via ORAL
  Filled 2018-09-22 (×7): qty 1

## 2018-09-22 MED ORDER — POTASSIUM CHLORIDE IN NACL 40-0.9 MEQ/L-% IV SOLN
INTRAVENOUS | Status: DC
  Administered 2018-09-22 (×2): 100 mL/h via INTRAVENOUS
  Filled 2018-09-22 (×4): qty 1000

## 2018-09-22 MED ORDER — HYDROMORPHONE HCL 1 MG/ML IJ SOLN
0.5000 mg | Freq: Once | INTRAMUSCULAR | Status: AC
  Administered 2018-09-22: 17:00:00 0.5 mg via INTRAVENOUS
  Filled 2018-09-22: qty 1

## 2018-09-22 MED ORDER — CLONAZEPAM 0.5 MG PO TABS: 0.5000 mg | ORAL_TABLET | Freq: Two times a day (BID) | ORAL | Status: DC | PRN

## 2018-09-22 MED ORDER — SODIUM CHLORIDE 0.9 % IV SOLN
INTRAVENOUS | Status: DC
  Administered 2018-09-22: 16:00:00 via INTRAVENOUS

## 2018-09-22 MED ORDER — ONDANSETRON HCL 4 MG/2ML IJ SOLN
4.0000 mg | Freq: Once | INTRAMUSCULAR | Status: AC
  Administered 2018-09-22: 4 mg via INTRAVENOUS
  Filled 2018-09-22: qty 2

## 2018-09-22 MED ORDER — ONDANSETRON HCL 4 MG/2ML IJ SOLN: 4.0000 mg | Freq: Once | INTRAMUSCULAR | Status: DC

## 2018-09-22 MED ORDER — IOHEXOL 300 MG/ML  SOLN
100.0000 mL | Freq: Once | INTRAMUSCULAR | Status: AC | PRN
  Administered 2018-09-22: 100 mL via INTRAVENOUS

## 2018-09-22 NOTE — Care Management Obs Status (Signed)
[place] NOTIFICATION   Patient Details  Name: Charles [name] MRN: [id number] Date of Birth: 07-25-1950   Medicare Observation Status Notification Given:  Yes    [place], LCSW 09/22/2018, 10:56 PM

## 2018-09-22 NOTE — ED Notes (Signed)
Patient transported to CT 

## 2018-09-22 NOTE — ED Triage Notes (Signed)
Patient had hepatic stent placed on e week ago, reports diarrhea for the past 4 days. Patient states he went to the bathroom but had vomiting "that looked like bowel movement."

## 2018-09-22 NOTE — ED Provider Notes (Addendum)
[facility] EMERGENCY DEPARTMENT Provider Note   CSN: [id number] Arrival date & time: 09/22/18  1458    History   Chief Complaint Chief Complaint  Patient presents with  . Emesis    HPI Charles [name] is a 68 y.o. male.     Patient with a complaint of diarrhea for the past 4 days.  Patient status post stent placement for metastatic colon cancer with pancreatic had a pancreatic mass area.  Patient followed by hematology oncology upstairs.  Last saw them for chemo about 6 weeks ago.  Due to see them again in about 10 days.  Dr. Melony Overly did the stent.  Dr. Buford Dresser is his usual gastroenterologist.  Patient here today as stated for 12-15 episodes of diarrhea for the past 4 days.  But today only had 6 so far.  And vomited once today.  No blood in it.  Has some epigastric abdominal discomfort and also has a little bit of lower substernal discomfort that is been present for about 6 months according to the patient.  No fever no upper respiratory symptoms.      Past Medical History:  Diagnosis Date  . Chronic anticoagulation   . Colon cancer ([place]) 07/24/2010   rectal ca, inv adenocarcinoma  . Depression 04/01/2011  . Diabetes mellitus without complication ([place])   . DVT (deep venous thrombosis) ([place]) 05/09/2011  . GERD (gastroesophageal reflux disease)   . High output ileostomy ([place]) 05/21/2011  . History of kidney stones   . HTN (hypertension)   . Hx of radiation therapy 09/02/10 to 10/14/10   pelvis  . Lung metastasis ([place])   . Neuropathy   . Peripheral vascular disease ([place])    dvt's,pe  . Pneumonia    hx x3  . Pulmonary embolism ([place])   . Rectal cancer ([place]) 01/15/2011   S/P radiation and concurrent 5-FU continuous infusion from 09/09/10- 10/10/10.  S/P proctectomy with colorectal anastomosis and diverting loop ileostomy on 11/14/10 at [facility] by Dr. Harlon Ditty. Pathology reveals a pT3b N1 with 3/20 lymph nodes.      Patient Active Problem List   Diagnosis Date Noted  .  Diarrhea in adult patient 09/22/2018  . Goals of care, counseling/discussion 09/20/2018  . Fecal incontinence 09/23/2017  . Community acquired pneumonia of right lower lobe of lung ([place]) 07/23/2016  . Abnormal pancreas function test   . Rectal pain 05/30/2016  . Hiatal hernia   . Schatzki's ring   . GI bleed 11/23/2014  . Gastritis 11/23/2014  . S/P thoracotomy 11/23/2014  . S/P partial lobectomy of lung 11/15/2014  . DVT (deep venous thrombosis) ([place])   . Lung nodule   . Lung nodule, solitary   . Acute pulmonary embolism ([place]) 09/08/2014  . Lactic acidosis 09/08/2014  . Lower abdominal pain 09/08/2014  . Urinary retention 09/08/2014  . Diarrhea 08/01/2014  . 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 Stiffness of joint, not elsewhere classified, ankle and foot 01/31/2014  . Weakness of both legs 01/31/2014  . Hx of radiation therapy   . Rectal cancer ([place]) 01/15/2011  . Colon cancer ([place]) 07/24/2010  . GERD 07/11/2010    Past Surgical History:  Procedure Laterality Date  . BILIARY STENT PLACEMENT N/A 09/16/2018   Procedure: STENT PLACEMENT;  Surgeon: Rogene Houston, MD;  [facility];  Service: Endoscopy;  Laterality: N/A;  . COLON SURGERY  11/14/2010   proctectomy with colorectal anastomosis and diverting loop ileostomy (temporary planned)  . COLONOSCOPY  07/2010   proximal rectal apple core mass 10-14cm from anal verge (adenocarcinoma), 2-3cm distal rectal carpet polyp s/p piecemeal snare polypectomy (adenoma)  . COLONOSCOPY  04/21/2012   RMR: Friable,fibrotic appearing colorectal anastomosis producing some luminal narrowing-not felt to be critical. path: focal erosion with slight inflammation and hyperemia. SURVEILLANCE DUE DEC 2015  . COLONOSCOPY N/A 11/24/2013   Dr. Rourk:somewhat fibrotic/friable anastomotic mucosal-status post biopsy (narrowing not felt to be clinically significant) Single colonic diverticulum. benign polypoid rectal  mucosa  . colostomy reversal  april 2013  . ERCP N/A 09/16/2018   Procedure: ENDOSCOPIC RETROGRADE CHOLANGIOPANCREATOGRAPHY (ERCP);  Surgeon: Rogene Houston, MD;  Location: [facility];  Service: Endoscopy;  Laterality: N/A;  . ESOPHAGOGASTRODUODENOSCOPY  07/2010   RMR: schatki ring s/p dilation, small hh, SB bx benign  . ESOPHAGOGASTRODUODENOSCOPY N/A 09/04/2015   Procedure: ESOPHAGOGASTRODUODENOSCOPY (EGD);  Surgeon: Daneil Dolin, MD;  [facility];  Service: Endoscopy;  Laterality: N/A;  730  . EUS  08/2010   Dr. Owens Loffler. uT3N0 circumferential, nearly obstruction rectosigmoid adenocarcinoma, distal edge 12cm from anal verge  . EUS N/A 06/19/2016   Procedure: UPPER ENDOSCOPIC ULTRASOUND (EUS) RADIAL;  Surgeon: Milus Banister, MD;  Location: [facility] ENDOS[facility];  Service: Endoscopy;  Laterality: N/A;  . HERNIA REPAIR     abd hernia repair  . IVC filter    . ivc filter    . port a cath placement    . PORT-A-CATH REMOVAL  09/24/2011   Procedure: REMOVAL PORT-A-CATH;  Surgeon: Donato Heinz, MD;  Location: [facility] ORS;  Service: General;  Laterality: N/A;  Minor Room  . PORTACATH PLACEMENT Right 07/07/2016   Procedure: INSERTION PORT-A-CATH;  Surgeon: Aviva Signs, MD;  Location: [facility] ORS;  Service: General;  Laterality: Right;  . SPHINCTEROTOMY N/A 09/16/2018   Procedure: SPHINCTEROTOMY with balloon dialation;  Surgeon: Rogene Houston, MD;  [facility];  Service: Endoscopy;  Laterality: N/A;  . VIDEO ASSISTED THORACOSCOPY (VATS)/WEDGE RESECTION Right 11/15/2014   Procedure: VIDEO ASSISTED THORACOSCOPY (VATS)/LUNG RESECTION WITH RIGHT LINGULECTOMY;  Surgeon: [name], MD;  Location: [facility];  Service: Thoracic;  Laterality: Right;  Marland Kitchen VIDEO BRONCHOSCOPY N/A 11/15/2014   Procedure: VIDEO BRONCHOSCOPY;  Surgeon: [name], MD;  Location: [facility] OR;  Service: Thoracic;  Laterality: N/A;        Home Medications    Prior to Admission medications   Medication Sig  Start Date End Date Taking? Authorizing Provider  acetaminophen (TYLENOL) 500 MG tablet Take 1,000 mg by mouth every 6 (six) hours as needed for mild pain or moderate pain.    Yes [provider]  clonazePAM (KLONOPIN) 0.5 MG tablet Take 1 tablet (0.5 mg total) by mouth 2 (two) times daily as needed. 01/26/18  Yes Lockamy, Randi L, NP-C  diphenoxylate-atropine (LOMOTIL) 2.5-0.025 MG tablet Take 1 tablet by mouth 4 (four) times daily as needed for diarrhea or loose stools. 08/10/18  Yes Derek Jack, MD  gabapentin (NEURONTIN) 300 MG capsule Take 3 capsules (900 mg total) by mouth 2 (two) times daily. 07/23/18  Yes Derek Jack, MD  glimepiride (AMARYL) 2 MG tablet Take 2 mg by mouth daily with breakfast.    Yes [provider]  omeprazole (PRILOSEC) 20 MG capsule Take 20 mg by mouth 2 (two) times daily before a meal.    Yes [provider]  ondansetron (ZOFRAN) 8 MG tablet Take 1 tablet (8 mg total) by mouth 2 (two) times daily as needed for  refractory nausea / vomiting. Start on day 3 after chemotherapy. 01/20/17  Yes Twana First, MD  oxyCODONE-acetaminophen (PERCOCET) 10-325 MG tablet Take 1 tablet by mouth every 6 (six) hours as needed for pain. 09/21/18  Yes Derek Jack, MD  Probiotic Product (PROBIOTIC DAILY PO) Take 1 capsule by mouth daily.   Yes [provider]  rivaroxaban (XARELTO) 20 MG TABS tablet Take 1 tablet (20 mg total) by mouth daily with supper. 09/19/18  Yes Rehman, Mechele Dawley, MD  traZODone (DESYREL) 100 MG tablet Take 1 tablet (100 mg total) by mouth at bedtime. 09/20/18  Yes Derek Jack, MD  vitamin B-12 (CYANOCOBALAMIN) 1000 MCG tablet Take 1,000 mcg by mouth daily.   Yes [provider]  mirtazapine (REMERON) 7.5 MG tablet Take 1 tablet (7.5 mg total) by mouth at bedtime. Patient not taking: Reported on 09/22/2018 06/22/18   Glennie [place], NP-C    Family History Family History  Problem Relation Age of  Onset  . Cancer Brother        throat  . Cancer Brother        prostate  . Colon cancer Neg Hx   . Liver disease Neg Hx   . Inflammatory bowel disease Neg Hx     Social History Social History   Tobacco Use  . Smoking status: Never Smoker  . Smokeless tobacco: Never Used  Substance Use Topics  . Alcohol use: No  . Drug use: No     Allergies   Oxycodone; Tramadol; and Trazodone and nefazodone   Review of Systems Review of Systems  Constitutional: Negative for chills and fever.  HENT: Negative for rhinorrhea and sore throat.   Eyes: Negative for visual disturbance.  Respiratory: Negative for cough and shortness of breath.   Cardiovascular: Positive for chest pain. Negative for leg swelling.  Gastrointestinal: Positive for abdominal pain, diarrhea, nausea and vomiting.  Genitourinary: Negative for dysuria.  Musculoskeletal: Negative for back pain and neck pain.  Skin: Negative for rash.  Allergic/Immunologic: Positive for immunocompromised state.  Neurological: Negative for dizziness, light-headedness and headaches.  Hematological: Bruises/bleeds easily.  Psychiatric/Behavioral: Negative for confusion.     Physical Exam Updated Vital Signs BP (!) 149/76   Pulse (!) 102   Temp 97.9 F (36.6 C) (Oral)   Resp 20   Ht 1.88 m ('6\' 2"' )   Wt 87.5 kg   SpO2 97%   BMI 24.78 kg/m   Physical Exam Vitals signs and nursing note reviewed.  Constitutional:      General: He is not in acute distress.    Appearance: Normal appearance. He is well-developed.  HENT:     Head: Normocephalic and atraumatic.  Eyes:     Extraocular Movements: Extraocular movements intact.     Conjunctiva/sclera: Conjunctivae normal.     Pupils: Pupils are equal, round, and reactive to light.  Neck:     Musculoskeletal: Normal range of motion and neck supple.  Cardiovascular:     Rate and Rhythm: Regular rhythm. Tachycardia present.     Heart sounds: Normal heart sounds. No murmur.   Pulmonary:     Effort: Pulmonary effort is normal. No respiratory distress.     Breath sounds: Normal breath sounds.     Comments: Patient with a port right upper anterior chest. Abdominal:     General: There is no distension.     Palpations: Abdomen is soft.     Tenderness: There is no abdominal tenderness.  Musculoskeletal: Normal range of motion.  Skin:    General: Skin is warm and dry.  Neurological:     General: No focal deficit present.     Mental Status: He is alert and oriented to person, place, and time.      ED Treatments / Results  Labs (all labs ordered are listed, but only abnormal results are displayed) Labs Reviewed  CBC WITH DIFFERENTIAL/PLATELET - Abnormal; Notable for the following components:      Result Value   RBC 4.09 (*)    All other components within normal limits  COMPREHENSIVE METABOLIC PANEL - Abnormal; Notable for the following components:   Potassium 3.4 (*)    CO2 20 (*)    Glucose, Bld 203 (*)    Calcium 8.7 (*)    Total Protein 6.2 (*)    Albumin 3.4 (*)    ALT 50 (*)    Alkaline Phosphatase 237 (*)    Total Bilirubin 2.3 (*)    All other components within normal limits  GASTROINTESTINAL PANEL BY PCR, STOOL (REPLACES STOOL CULTURE)  C DIFFICILE QUICK SCREEN W PCR REFLEX  SARS CORONAVIRUS 2 (HOSPITAL ORDER, [place] [facility])  LIPASE, BLOOD  MAGNESIUM  PHOSPHORUS  HIV ANTIBODY (ROUTINE TESTING W REFLEX)  COMPREHENSIVE METABOLIC PANEL  CBC    EKG EKG Interpretation  Date/Time:  Wednesday Sep 22 2018 15:37:22 EDT Ventricular Rate:  101 PR Interval:    QRS Duration: 98 QT Interval:  345 QTC Calculation: 448 R Axis:   40 Text Interpretation:  Sinus tachycardia Multiple ventricular premature complexes Low voltage, precordial leads Confirmed by Fredia Sorrow [id number]) on 09/22/2018 3:44:12 PM   Radiology Dg Chest 2 View  Result Date: 09/22/2018 CLINICAL DATA:  Diarrhea central chest pain EXAM: CHEST - 2  VIEW COMPARISON:  03/22/2018, CT chest 07/30/2018 FINDINGS: Right-sided central venous port tip over the SVC. Chronic elevation of right diaphragm with scarring at the right middle lobe. CT demonstrated right upper lobe lung nodules not well seen. Stable cardiomediastinal silhouette. No pneumothorax. IMPRESSION: No active cardiopulmonary disease. Stable right middle lobe scarring. Electronically Signed   By: Donavan Foil M.D.   On: 09/22/2018 16:59   Ct Abdomen Pelvis W Contrast  Result Date: 09/22/2018 CLINICAL DATA:  Stage IV rectal cancer with pancreatic metastases. Status post common bile duct stent placement. EXAM: CT ABDOMEN AND PELVIS WITH CONTRAST TECHNIQUE: Multidetector CT imaging of the abdomen and pelvis was performed using the standard protocol following bolus administration of intravenous contrast. CONTRAST:  186m OMNIPAQUE IOHEXOL 300 MG/ML  SOLN COMPARISON:  MRI 09/10/2018.  CT scan 07/30/2018. FINDINGS: Lower chest: The heart size is normal. No substantial pericardial effusion. Coronary artery calcification is evident. Stable 7 mm anterior right lung nodule. Atelectasis noted lower lobes bilaterally. Hepatobiliary: No suspicious focal abnormality within the liver parenchyma. Gallbladder is distended with ill-defined gallbladder wall suggesting edema. Pneumobilia is compatible with the presence of the common bile duct stent. Pancreas: Diffuse pancreatic parenchymal atrophy with associated dilatation of the main pancreatic duct. Imaging features similar to prior with abrupt cut off at the level of the pancreatic head. 2.0 cm hypoenhancing lesion in the posterior pancreatic head is similar to prior MRI when it was measured at 2.2 cm. Spleen: No splenomegaly. No focal mass lesion. Adrenals/Urinary Tract: Stable adrenal glands bilaterally without mass lesion. Kidneys unremarkable. No evidence for hydroureter. The urinary bladder appears normal for the degree of distention. Stomach/Bowel: Stomach is  distended with fluid and food material. Duodenum is normally positioned as is  the ligament of Treitz. No small bowel wall thickening. No small bowel dilatation. No small bowel wall thickening. No small bowel dilatation. The terminal ileum is normal. Appendicoliths noted in the tip of the appendix without periappendiceal edema or inflammation. Anastomotic staple line noted at the rectosigmoid junction with circumferential wall thickening in the rectum, progressed since 07/30/2018. Vascular/Lymphatic: There is abdominal aortic atherosclerosis without aneurysm. IVC filter identified in situ. Stranding in the hepato duodenal ligament may be related to edema. There is no gastrohepatic or hepatoduodenal ligament lymphadenopathy. No intraperitoneal or retroperitoneal lymphadenopathy. The 8 mm short axis celiac axis lymph node is stable and upper normal for size. Similar upper normal lymph nodes are seen in the portal caval space. No pelvic sidewall lymphadenopathy. Reproductive: The prostate gland and seminal vesicles are unremarkable. Other: No intraperitoneal free fluid. Musculoskeletal: Bilateral groin hernias contain only fat. No worrisome lytic or sclerotic osseous abnormality. IMPRESSION: 1. Marked interval increase in circumferential wall thickening in the rectum, adjacent to the anastomosis. Recurrent disease a distinct concern. 2. Stomach is distended with fluid and food material. No associated small bowel dilatation. No obstructing lesion to suggest gastric outlet obstruction by CT. 3. Gallbladder is distended with apparent gallbladder wall edema. Cholecystitis a concern by imaging. 4. Stable appearance of the posterior pancreatic head lesion with diffuse pancreatic parenchymal atrophy and chronic dilatation of the main pancreatic duct. By report, the pancreatic head lesion represents metastatic colorectal cancer. 5. Stable upper normal lymph nodes in the celiac axis. 6.  Aortic Atherosclerois (ICD10-170.0) 7.  Stable anterior right upper lobe pulmonary nodule. Electronically Signed   By: Misty Stanley M.D.   On: 09/22/2018 17:30    Procedures Procedures (including critical care time)  Medications Ordered in ED Medications  ondansetron (ZOFRAN) injection 4 mg (4 mg Intravenous Not Given 09/22/18 1635)  0.9 % NaCl with KCl 40 mEq / L  infusion (has no administration in time range)  famotidine (PEPCID) IVPB 20 mg premix (has no administration in time range)  prochlorperazine (COMPAZINE) injection 5 mg (has no administration in time range)  ondansetron (ZOFRAN) injection 4 mg (4 mg Intravenous Given 09/22/18 1541)  sodium chloride 0.9 % bolus 500 mL (0 mLs Intravenous Stopped 09/22/18 1957)  HYDROmorphone (DILAUDID) injection 0.5 mg (0.5 mg Intravenous Given 09/22/18 1707)  iohexol (OMNIPAQUE) 300 MG/ML solution 100 mL (100 mLs Intravenous Contrast Given 09/22/18 1650)     Initial Impression / Assessment and Plan / ED Course  I have reviewed the triage vital signs and the nursing notes.  Pertinent labs & imaging results that were available during my care of the patient were reviewed by me and considered in my medical decision making (see chart for details).        Patient's work-up was significant for no leukocytosis.  Liver function test without significant change.  Chest x-ray without acute findings.  Heart rate slightly tachycardic.  Patient not in any distress not febrile not hypotensive.  CT scan with a myriad of findings some of them most likely consistent to pre-stent placement.  Long discussion with Dr. Melony Overly who placed a stent.  Due to the diarrhea Dr. Melony Overly felt patient should probably be admitted I did an order GI cultures as well as C. difficile cultures.  The patient has not had a bowel movement here.  I Dr. Chriss Czar also little concerned about the possibly developing acute cholecystitis.  But he did think that his gallbladder was slightly enlarged prior to the procedure.  He recommended  admission.  Discussed with Dr. Olevia Bowens hospitalist who will admit.  Patient is okay with admission.   Final Clinical Impressions(s) / ED Diagnoses   Final diagnoses:  Diarrhea, unspecified type  Epigastric abdominal pain    ED Discharge Orders    None       Fredia Sorrow, MD 09/22/18 2012    Fredia Sorrow, MD 09/22/18 2013

## 2018-09-22 NOTE — H&P (Signed)
History and Physical    Charles [name]:[id number] DOB: 11/08/1950 DOA: 09/22/2018  PCP: [name], MD   Patient coming from: Home.  I have personally briefly reviewed patient's old medical records in [place]  Chief Complaint: Diarrhea.  HPI: Charles [name] is a 68 y.o. male with medical history significant of history of anxiety, depression, type 2 diabetes, GERD, history of high output ileostomy, history of urolithiasis, hypertension, history of colorectal cancer, history of proctectomy, radiation therapy and chemotherapy (last cycle about 6 weeks ago), history of multiple episodes of pneumonia, DVTs, history of pulmonary embolism on Xarelto who is coming to the emergency department due to persistent diarrhea for the past 4 days.  He states that for the past 4 days he has been having of 8-12 episodes of loose stools daily associated with mild RUQ pain.  The patient states for the past few days every time he tries to eat something area usually resolves in a loose stool bowel movement.  He had an episode of emesis earlier today.  He denies melena, hematochezia, flank pain, dysuria or frequency, but states that his urine is darker and he has been urinating less often.  He has not been febrile until he arrived to the telemetry floor.  He states he has an occipital headache, has not been able to eat or sleep properly as well.  He has positional dizziness and complaints of occasional pleuritic chest pain, which he thought may be due to his IVC filter placement.  However, the patient has fibrosis on the lungs and the Port-A-Cath catheter.  He denies rhinorrhea, sore throat, wheezing, hemoptysis, palpitations, diaphoresis, PND, orthopnea, but state he occasionally gets lower extremity edema.  No polyuria, polydipsia, polyphagia or blurred vision.  ED Course: Initial vital signs temperature 97.9 F, pulse 118, respirations 18, blood pressure 129/85 mmHg and O2 sat 99% on room air.  The  patient received a 500 mL normal saline bolus, hydromorphone 0.5 mg IVP and ondansetron 4 mg IVP x1.  His CBC showed a white count 9.0 with 82% neutrophils, 10% lymphocytes and 8% monocytes.  CMP shows a glucose of 203 and magnesium of 1.5 mg/dL.  Potassium of 3.4 and CO2 of 20 mmol/L.  All other CMP electrolytes are normal when calcium is corrected to albumin.  Renal function was normal.  Total protein was 6.2 and albumin 3.4 g/dL.  AST was 29, ALT 50, alkaline phosphatase 237 units/L and total bilirubin 2.3 mg/dL.  Renal and hepatic functions are similar to the results from 09/10/2018.  Imaging: his two-view chest radiograph did not show any active cardiopulmonary disease.  There was stable right middle lobe scaring.  CT abdomen/pelvis with contrast was significant for marked interval increase in circumferential wall thickening in the rectum, adjacent to the anastomosis.  Recurrent disease is distant concern.  His stomach is distended with fluid and food material.  There was no associated SBO or gastric outlet obstruction.  Gallbladder was distended with apparent wall edema.  There was stable appearance of the posterior pancreatic head lesion and stable upper normal lymph nodes in the celiac axis.  Aortic atherosclerosis was incidentally seen as well.  Please see images and full radiology report for further detail.  Review of Systems: As per HPI otherwise 10 point review of systems negative.   Past Medical History:  Diagnosis Date   Chronic anticoagulation    Colon cancer ([place]) 07/24/2010   rectal ca, inv adenocarcinoma   Depression 04/01/2011   Diabetes  mellitus without complication ([place])    DVT (deep venous thrombosis) ([place]) 05/09/2011   GERD (gastroesophageal reflux disease)    High output ileostomy ([place]) 05/21/2011   History of kidney stones    HTN (hypertension)    Hx of radiation therapy 09/02/10 to 10/14/10   pelvis   Lung metastasis ([place])    Neuropathy    Peripheral vascular  disease ([place])    dvt's,pe   Pneumonia    hx x3   Pulmonary embolism ([place])    Rectal cancer ([place]) 01/15/2011   S/P radiation and concurrent 5-FU continuous infusion from 09/09/10- 10/10/10.  S/P proctectomy with colorectal anastomosis and diverting loop ileostomy on 11/14/10 at [facility] by Dr. Harlon Ditty. Pathology reveals a pT3b N1 with 3/20 lymph nodes.      Past Surgical History:  Procedure Laterality Date   BILIARY STENT PLACEMENT N/A 09/16/2018   Procedure: STENT PLACEMENT;  Surgeon: Rogene Houston, MD;  [facility];  Service: Endoscopy;  Laterality: N/A;   COLON SURGERY  11/14/2010   proctectomy with colorectal anastomosis and diverting loop ileostomy (temporary planned)   COLONOSCOPY  07/2010   proximal rectal apple core mass 10-14cm from anal verge (adenocarcinoma), 2-3cm distal rectal carpet polyp s/p piecemeal snare polypectomy (adenoma)   COLONOSCOPY  04/21/2012   RMR: Friable,fibrotic appearing colorectal anastomosis producing some luminal narrowing-not felt to be critical. path: focal erosion with slight inflammation and hyperemia. SURVEILLANCE DUE DEC 2015   COLONOSCOPY N/A 11/24/2013   Dr. Rourk:somewhat fibrotic/friable anastomotic mucosal-status post biopsy (narrowing not felt to be clinically significant) Single colonic diverticulum. benign polypoid rectal mucosa   colostomy reversal  april 2013   ERCP N/A 09/16/2018   Procedure: ENDOSCOPIC RETROGRADE CHOLANGIOPANCREATOGRAPHY (ERCP);  Surgeon: Rogene Houston, MD;  [facility];  Service: Endoscopy;  Laterality: N/A;   ESOPHAGOGASTRODUODENOSCOPY  07/2010   RMR: schatki ring s/p dilation, small hh, SB bx benign   ESOPHAGOGASTRODUODENOSCOPY N/A 09/04/2015   Procedure: ESOPHAGOGASTRODUODENOSCOPY (EGD);  Surgeon: Daneil Dolin, MD;  [facility];  Service: Endoscopy;  Laterality: N/A;  730   EUS  08/2010   Dr. Owens Loffler. uT3N0 circumferential, nearly obstruction rectosigmoid  adenocarcinoma, distal edge 12cm from anal verge   EUS N/A 06/19/2016   Procedure: UPPER ENDOSCOPIC ULTRASOUND (EUS) RADIAL;  Surgeon: Milus Banister, MD;  [facility];  Service: Endoscopy;  Laterality: N/A;   HERNIA REPAIR     abd hernia repair   IVC filter     ivc filter     port a cath placement     PORT-A-CATH REMOVAL  09/24/2011   Procedure: REMOVAL PORT-A-CATH;  Surgeon: Donato Heinz, MD;  Location: [facility];  Service: General;  Laterality: N/A;  Minor Room   PORTACATH PLACEMENT Right 07/07/2016   Procedure: INSERTION PORT-A-CATH;  Surgeon: Aviva Signs, MD;  Location: [facility];  Service: General;  Laterality: Right;   SPHINCTEROTOMY N/A 09/16/2018   Procedure: SPHINCTEROTOMY with balloon dialation;  Surgeon: Rogene Houston, MD;  [facility];  Service: Endoscopy;  Laterality: N/A;   VIDEO ASSISTED THORACOSCOPY (VATS)/WEDGE RESECTION Right 11/15/2014   Procedure: VIDEO ASSISTED THORACOSCOPY (VATS)/LUNG RESECTION WITH RIGHT LINGULECTOMY;  Surgeon: [name], MD;  Location: [place];  Service: Thoracic;  Laterality: Right;   VIDEO BRONCHOSCOPY N/A 11/15/2014   Procedure: VIDEO BRONCHOSCOPY;  Surgeon: [name], MD;  Location: [place];  Service: Thoracic;  Laterality: N/A;     reports that he has never  smoked. He has never used smokeless tobacco. He reports that he does not drink alcohol or use drugs.  Allergies  Allergen Reactions   Oxycodone     Blisters, hallucinations   Tramadol     Blisters, hallucinations   Trazodone And Nefazodone Other (See Comments)    hallucinations    Family History  Problem Relation Age of Onset   Cancer Brother        throat   Cancer Brother        prostate   Colon cancer Neg Hx    Liver disease Neg Hx    Inflammatory bowel disease Neg Hx    Prior to Admission medications   Medication Sig Start Date End Date Taking? Authorizing Provider  acetaminophen (TYLENOL) 500 MG tablet Take 1,000 mg by  mouth every 6 (six) hours as needed for mild pain or moderate pain.    Yes [provider]  clonazePAM (KLONOPIN) 0.5 MG tablet Take 1 tablet (0.5 mg total) by mouth 2 (two) times daily as needed. 01/26/18  Yes Lockamy, Randi L, NP-C  diphenoxylate-atropine (LOMOTIL) 2.5-0.025 MG tablet Take 1 tablet by mouth 4 (four) times daily as needed for diarrhea or loose stools. 08/10/18  Yes Derek Jack, MD  gabapentin (NEURONTIN) 300 MG capsule Take 3 capsules (900 mg total) by mouth 2 (two) times daily. 07/23/18  Yes Derek Jack, MD  glimepiride (AMARYL) 2 MG tablet Take 2 mg by mouth daily with breakfast.    Yes [provider]  omeprazole (PRILOSEC) 20 MG capsule Take 20 mg by mouth 2 (two) times daily before a meal.    Yes [provider]  ondansetron (ZOFRAN) 8 MG tablet Take 1 tablet (8 mg total) by mouth 2 (two) times daily as needed for refractory nausea / vomiting. Start on day 3 after chemotherapy. 01/20/17  Yes Twana First, MD  oxyCODONE-acetaminophen (PERCOCET) 10-325 MG tablet Take 1 tablet by mouth every 6 (six) hours as needed for pain. 09/21/18  Yes Derek Jack, MD  Probiotic Product (PROBIOTIC DAILY PO) Take 1 capsule by mouth daily.   Yes [provider]  rivaroxaban (XARELTO) 20 MG TABS tablet Take 1 tablet (20 mg total) by mouth daily with supper. 09/19/18  Yes Rehman, Mechele Dawley, MD  traZODone (DESYREL) 100 MG tablet Take 1 tablet (100 mg total) by mouth at bedtime. 09/20/18  Yes Derek Jack, MD  vitamin B-12 (CYANOCOBALAMIN) 1000 MCG tablet Take 1,000 mcg by mouth daily.   Yes [provider]  mirtazapine (REMERON) 7.5 MG tablet Take 1 tablet (7.5 mg total) by mouth at bedtime. Patient not taking: Reported on 09/22/2018 06/22/18   Glennie Isle, NP-C    Physical Exam: Vitals:   09/22/18 1707 09/22/18 1730 09/22/18 1830 09/22/18 1900  BP: (!) 165/104 139/66 (!) 143/70 (!) 149/76  Pulse: (!) 101 67 (!) 102     Resp: 20 (!) 21 19 20   Temp:      TempSrc:      SpO2: 94% 94% 97%   Weight:      Height:        Constitutional: NAD, calm, comfortable Eyes: PERRL, lids and conjunctivae normal ENMT: Mucous membranes are moist. Posterior pharynx clear of any exudate or lesions. Neck: normal, supple, no masses, no thyromegaly Respiratory: clear to auscultation bilaterally, no wheezing, no crackles. Normal respiratory effort. No accessory muscle use.  Cardiovascular: Regular rate and rhythm, no murmurs / rubs / gallops. No extremity edema. 2+ pedal pulses. No carotid bruits.  Abdomen: no tenderness, no masses palpated. No hepatosplenomegaly. Bowel sounds positive.  Musculoskeletal: no clubbing / cyanosis. No joint deformity upper and lower extremities. Good ROM, no contractures. Normal muscle tone.  Skin: no rashes, lesions, ulcers. No induration Neurologic: CN 2-12 grossly intact. Sensation intact, DTR normal. Strength 5/5 in all 4.  Psychiatric: Normal judgment and insight. Alert and oriented x 3. Normal mood.   Labs on Admission: I have personally reviewed following labs and imaging studies  CBC: Recent Labs  Lab 09/22/18 1529  WBC 9.0  NEUTROABS 7.3  HGB 13.6  HCT 40.6  MCV 99.3  PLT 751   Basic Metabolic Panel: Recent Labs  Lab 09/22/18 1529  NA 138  K 3.4*  CL 106  CO2 20*  GLUCOSE 203*  BUN 9  CREATININE 0.66  CALCIUM 8.7*   GFR: Estimated Creatinine Clearance: 102.8 mL/min (by C-G formula based on SCr of 0.66 mg/dL). Liver Function Tests: Recent Labs  Lab 09/20/18 1218 09/22/18 1529  AST 32 29  ALT 78* 50*  ALKPHOS 271* 237*  BILITOT 2.3* 2.3*  PROT 6.5 6.2*  ALBUMIN 3.4* 3.4*   Recent Labs  Lab 09/22/18 1529  LIPASE 17   No results for input(s): AMMONIA in the last 168 hours. Coagulation Profile: No results for input(s): INR, PROTIME in the last 168 hours. Cardiac Enzymes: No results for input(s): CKTOTAL, CKMB, CKMBINDEX, TROPONINI in the last 168  hours. BNP (last 3 results) No results for input(s): PROBNP in the last 8760 hours. HbA1C: No results for input(s): HGBA1C in the last 72 hours. CBG: Recent Labs  Lab 09/16/18 1158 09/16/18 1437  GLUCAP 119* 154*   Lipid Profile: No results for input(s): CHOL, HDL, LDLCALC, TRIG, CHOLHDL, LDLDIRECT in the last 72 hours. Thyroid Function Tests: No results for input(s): TSH, T4TOTAL, FREET4, T3FREE, THYROIDAB in the last 72 hours. Anemia Panel: No results for input(s): VITAMINB12, FOLATE, FERRITIN, TIBC, IRON, RETICCTPCT in the last 72 hours. Urine analysis:    Component Value Date/Time   COLORURINE YELLOW 08/19/2018 [place] 08/19/2018 0844   LABSPEC 1.016 08/19/2018 0844   PHURINE 5.0 08/19/2018 [place] 08/19/2018 0844   HGBUR NEGATIVE 08/19/2018 0844   BILIRUBINUR NEGATIVE 08/19/2018 0844   [place] 08/19/2018 0844   PROTEINUR NEGATIVE 08/19/2018 0844   UROBILINOGEN 1.0 11/14/2014 1504   NITRITE NEGATIVE 08/19/2018 0844   LEUKOCYTESUR NEGATIVE 08/19/2018 0844    Radiological Exams on Admission: Dg Chest 2 View  Result Date: 09/22/2018 CLINICAL DATA:  Diarrhea central chest pain EXAM: CHEST - 2 VIEW COMPARISON:  03/22/2018, CT chest 07/30/2018 FINDINGS: Right-sided central venous port tip over the SVC. Chronic elevation of right diaphragm with scarring at the right middle lobe. CT demonstrated right upper lobe lung nodules not well seen. Stable cardiomediastinal silhouette. No pneumothorax. IMPRESSION: No active cardiopulmonary disease. Stable right middle lobe scarring. Electronically Signed   By: Donavan Foil M.D.   On: 09/22/2018 16:59   Ct Abdomen Pelvis W Contrast  Result Date: 09/22/2018 CLINICAL DATA:  Stage IV rectal cancer with pancreatic metastases. Status post common bile duct stent placement. EXAM: CT ABDOMEN AND PELVIS WITH CONTRAST TECHNIQUE: Multidetector CT imaging of the abdomen and pelvis was performed using the  standard protocol following bolus administration of intravenous contrast. CONTRAST:  166mL OMNIPAQUE IOHEXOL 300 MG/ML  SOLN COMPARISON:  MRI 09/10/2018.  CT scan 07/30/2018. FINDINGS: Lower chest: The heart size is normal. No substantial pericardial effusion. Coronary artery calcification is evident.  Stable 7 mm anterior right lung nodule. Atelectasis noted lower lobes bilaterally. Hepatobiliary: No suspicious focal abnormality within the liver parenchyma. Gallbladder is distended with ill-defined gallbladder wall suggesting edema. Pneumobilia is compatible with the presence of the common bile duct stent. Pancreas: Diffuse pancreatic parenchymal atrophy with associated dilatation of the main pancreatic duct. Imaging features similar to prior with abrupt cut off at the level of the pancreatic head. 2.0 cm hypoenhancing lesion in the posterior pancreatic head is similar to prior MRI when it was measured at 2.2 cm. Spleen: No splenomegaly. No focal mass lesion. Adrenals/Urinary Tract: Stable adrenal glands bilaterally without mass lesion. Kidneys unremarkable. No evidence for hydroureter. The urinary bladder appears normal for the degree of distention. Stomach/Bowel: Stomach is distended with fluid and food material. Duodenum is normally positioned as is the ligament of Treitz. No small bowel wall thickening. No small bowel dilatation. No small bowel wall thickening. No small bowel dilatation. The terminal ileum is normal. Appendicoliths noted in the tip of the appendix without periappendiceal edema or inflammation. Anastomotic staple line noted at the rectosigmoid junction with circumferential wall thickening in the rectum, progressed since 07/30/2018. Vascular/Lymphatic: There is abdominal aortic atherosclerosis without aneurysm. IVC filter identified in situ. Stranding in the hepato duodenal ligament may be related to edema. There is no gastrohepatic or hepatoduodenal ligament lymphadenopathy. No intraperitoneal or  retroperitoneal lymphadenopathy. The 8 mm short axis celiac axis lymph node is stable and upper normal for size. Similar upper normal lymph nodes are seen in the portal caval space. No pelvic sidewall lymphadenopathy. Reproductive: The prostate gland and seminal vesicles are unremarkable. Other: No intraperitoneal free fluid. Musculoskeletal: Bilateral groin hernias contain only fat. No worrisome lytic or sclerotic osseous abnormality. IMPRESSION: 1. Marked interval increase in circumferential wall thickening in the rectum, adjacent to the anastomosis. Recurrent disease a distinct concern. 2. Stomach is distended with fluid and food material. No associated small bowel dilatation. No obstructing lesion to suggest gastric outlet obstruction by CT. 3. Gallbladder is distended with apparent gallbladder wall edema. Cholecystitis a concern by imaging. 4. Stable appearance of the posterior pancreatic head lesion with diffuse pancreatic parenchymal atrophy and chronic dilatation of the main pancreatic duct. By report, the pancreatic head lesion represents metastatic colorectal cancer. 5. Stable upper normal lymph nodes in the celiac axis. 6.  Aortic Atherosclerois (ICD10-170.0) 7. Stable anterior right upper lobe pulmonary nodule. Electronically Signed   By: Misty Stanley M.D.   On: 09/22/2018 17:30    EKG: Independently reviewed. Vent. rate 101 BPM PR interval * ms QRS duration 98 ms QT/QTc 345/448 ms P-R-T axes 48 40 52 Sinus tachycardia Multiple ventricular premature complexes Low voltage, precordial leads  Assessment/Plan Principal Problem:   Diarrhea in adult patient Observation/telemetry. Clear liquid diet. Continue IV fluids. Antiemetics as needed. Analgesics as needed. Avoid medications that diminish GI motility. Follow-up C. difficile and GI pathogen by PCR test.  Active Problems:     Dilated gallbladder Keep n.p.o. Continue IV fluids. Antiemetics as needed. Analgesics as needed. Defer  antibiotics unless febrile or positive for GI pathogen. Consult gastroenterology in the morning.    Type 2 diabetes mellitus (HCC) Blood glucose over 200 clear liquid diet. Continue IV fluids. Continue Amaryl 2 mg p.o. daily. CBG monitoring with sensitive sliding scale.    Colon cancer [facility]) Follow-up with oncology as scheduled.    Hypokalemia Replacing. Follow-up potassium level in the morning.    Hypomagnesemia Replacing. Follow-up level as needed.    GERD Continue PPI.  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 twice daily.    DVT (deep venous thrombosis) (HCC)   History of pulmonary embolism Continue daily Xarelto.      DVT prophylaxis: On Xarelto. Code Status: Full code. Family Communication: Disposition Plan: Observation for IV hydration, electrolyte replacement and further work-up. Consults called: Routine gastroenterology consult. Admission status: Observation/telemetry.   Reubin Milan MD [facility]  09/22/2018, 8:22 PM   This document was created using Dragon voice recognition software and may contain some unintended transcription errors.

## 2018-09-23 DIAGNOSIS — R933 Abnormal findings on diagnostic imaging of other parts of digestive tract: Secondary | ICD-10-CM

## 2018-09-23 DIAGNOSIS — R197 Diarrhea, unspecified: Secondary | ICD-10-CM

## 2018-09-23 DIAGNOSIS — K828 Other specified diseases of gallbladder: Secondary | ICD-10-CM

## 2018-09-23 LAB — COMPREHENSIVE METABOLIC PANEL
ALT: 38 U/L (ref 0–44)
AST: 22 U/L (ref 15–41)
Albumin: 2.8 g/dL — ABNORMAL LOW (ref 3.5–5.0)
Alkaline Phosphatase: 191 U/L — ABNORMAL HIGH (ref 38–126)
Anion gap: 10 (ref 5–15)
BUN: 7 mg/dL — ABNORMAL LOW (ref 8–23)
CO2: 22 mmol/L (ref 22–32)
Calcium: 8.1 mg/dL — ABNORMAL LOW (ref 8.9–10.3)
Chloride: 107 mmol/L (ref 98–111)
Creatinine, Ser: 0.62 mg/dL (ref 0.61–1.24)
GFR calc Af Amer: >60 mL/min (ref >60)
GFR calc non Af Amer: >60 mL/min (ref >60)
Glucose, Bld: 159 mg/dL — ABNORMAL HIGH (ref 70–99)
Potassium: 3.8 mmol/L (ref 3.5–5.1)
Sodium: 139 mmol/L (ref 135–145)
Total Bilirubin: 2.7 mg/dL — ABNORMAL HIGH (ref 0.3–1.2)
Total Protein: 5.5 g/dL — ABNORMAL LOW (ref 6.5–8.1)

## 2018-09-23 LAB — GASTROINTESTINAL PANEL BY PCR, STOOL (REPLACES STOOL CULTURE)

## 2018-09-23 LAB — CBC
HCT: 38.4 % — ABNORMAL LOW (ref 39.0–52.0)
Hemoglobin: 12.6 g/dL — ABNORMAL LOW (ref 13.0–17.0)
MCH: 33.2 pg (ref 26.0–34.0)
MCHC: 32.8 g/dL (ref 30.0–36.0)
MCV: 101.1 fL — ABNORMAL HIGH (ref 80.0–100.0)
Platelets: 225 10*3/uL (ref 150–400)
RBC: 3.8 MIL/uL — ABNORMAL LOW (ref 4.22–5.81)
RDW: 14.5 % (ref 11.5–15.5)
WBC: 10.7 10*3/uL — ABNORMAL HIGH (ref 4.0–10.5)
nRBC: 0 % (ref 0.0–0.2)

## 2018-09-23 LAB — C DIFFICILE QUICK SCREEN W PCR REFLEX
C Diff antigen: NEGATIVE
C Diff interpretation: NOT DETECTED
C Diff toxin: NEGATIVE

## 2018-09-23 LAB — GLUCOSE, CAPILLARY
Glucose-Capillary: 132 mg/dL — ABNORMAL HIGH (ref 70–99)
Glucose-Capillary: 128 mg/dL — ABNORMAL HIGH (ref 70–99)
Glucose-Capillary: 154 mg/dL — ABNORMAL HIGH (ref 70–99)
Glucose-Capillary: 131 mg/dL — ABNORMAL HIGH (ref 70–99)

## 2018-09-23 LAB — LACTIC ACID, PLASMA: Lactic Acid, Venous: 1.4 mmol/L (ref 0.5–1.9)

## 2018-09-23 MED ORDER — ACETAMINOPHEN 325 MG PO TABS
650.0000 mg | ORAL_TABLET | Freq: Four times a day (QID) | ORAL | Status: DC | PRN
  Administered 2018-09-23 – 2018-09-25 (×2): 650 mg via ORAL
  Filled 2018-09-23: qty 2

## 2018-09-23 MED ORDER — LACTATED RINGERS IV SOLN
INTRAVENOUS | Status: AC
  Administered 2018-09-23: 10:00:00 via INTRAVENOUS

## 2018-09-23 MED ORDER — DIPHENOXYLATE-ATROPINE 2.5-0.025 MG PO TABS
1.0000 | ORAL_TABLET | Freq: Two times a day (BID) | ORAL | Status: DC
  Administered 2018-09-23: 17:00:00 1 via ORAL
  Filled 2018-09-23: qty 1

## 2018-09-23 MED ORDER — LOPERAMIDE HCL 2 MG PO CAPS
2.0000 mg | ORAL_CAPSULE | ORAL | Status: DC | PRN
  Administered 2018-09-23: 2 mg via ORAL
  Filled 2018-09-23: qty 1

## 2018-09-23 MED ORDER — PANCRELIPASE (LIP-PROT-AMYL) 36000-114000 UNITS PO CPEP
108000.0000 [IU] | ORAL_CAPSULE | Freq: Three times a day (TID) | ORAL | Status: DC
  Administered 2018-09-23 – 2018-09-25 (×5): 108000 [IU] via ORAL
  Filled 2018-09-23 (×6): qty 3

## 2018-09-23 MED ORDER — PANCRELIPASE (LIP-PROT-AMYL) 12000-38000 UNITS PO CPEP
72000.0000 [IU] | ORAL_CAPSULE | ORAL | Status: DC
  Administered 2018-09-23 – 2018-09-25 (×3): 72000 [IU] via ORAL
  Filled 2018-09-23 (×3): qty 6

## 2018-09-23 NOTE — H&P (View-Only) (Signed)
Referring Provider: Rodena Goldmann, DO Primary Care Physician:  [name], MD Primary Gastroenterologist:  Garfield Cornea, MD  Reason for Consultation:  S/p ERCP, colon cancer, diarrhea, ?cholecystitis  HPI: Charles [name] is a 68 y.o. male with past medical history significant for metastatic colon carcinoma the pancreas receiving chemotherapy, diabetes, GERD, DVTs/PE on Xarelto, recent ERCP with covered metal stent placement for CBD stricture who is coming into the ED for persistent diarrhea of several days, weakness and dehydration.    Patient's original diagnosis of rectal carcinoma in 2012 status post chemoradiation followed by surgery with ileostomy which subsequently was taken down. He had more postop chemotherapy and was disease-free until 2016 when he was found to have mets to the lung.  He underwent lung resection.  Since then he has had recurrence to the pancreas and currently receiving chemotherapy, last dose 5 weeks ago.    Patient has a chronic history of diarrhea for years, upwards to 10-12 stools daily at times including at least 3 episodes of nocturnal diarrhea.  On his best days he may have 3-4 stools.  Rarely has a solid stool.  Patient was seen on May 14 when he was noted to have abnormal LFTs found to have a dilated biliary system with distal cutoff/obstruction.  Gallbladder distended on MRCP.  He was found to have obstructive jaundice secondary to metastatic colon cancer involving pancreatic head.  Patient underwent ERCP on May 14 by Dr. Laural Golden was found to have a single localized biliary stricture due to metastatic disease in the common bile duct, upstream biliary dilatation noted.  He performed a sphincterotomy with dilation of the common bile duct to allow placement of a covered metal stent.   Patient tells me that he has had right lower chest pain/right upper quadrant pain for about 6 months but is been worse the last 3 months.  Pain is constant, does not worsen with  meals.  Recently his pain has worsened.  Prior to his biliary stenting he had pain predominantly in the right upper quadrant/right lower chest and radiated into his back.  There was improvement after stenting.  4 days prior to admission he developed worsening abdominal pain not controlled with pain medication.  His pain medication was increased.  He presented to the ED yesterday with complaints of severe diarrhea for the past 4 days.  Episode of vomiting that looked like a BM, but believes it was the chocolate Ensure, did not smell like stool. Having 8-12 loose stools daily associated, occurring every time he eats.  He notes decreased stooling with fasting. No melena or rectal bleeding.  Felt like he was urinating less.  Has had some dizziness, headache.  CT abdomen pelvis with contrast yesterday, no suspicious focal abnormality within the liver parenchyma.  Gallbladder distended with ill-defined gallbladder wall suggesting edema.  Pneumobilia present consistent with previously placed common bile duct stent.  Marked interval increase in circumferential wall thickening in the rectum, adjacent to the anastomosis compared to March 2020.  Stomach distended with fluid and food material but no evidence of obstruction.  Stable appearance of posterior pancreatic head lesion with diffuse parenchymal atrophy and chronic dilatation of the main pancreatic duct.  Initial increase in LFTs noted on May 8, total bilirubin 3.4, alkaline phosphatase 348, AST 290, ALT 332.  On May 18 (4 days after biliary stenting), total bilirubin 2.3, alkaline phosphatase 271, AST 32, ALT 78.  Yesterday's total bilirubin was 2.3, alk phos 237, AST 29, ALT 50.  White improvement today with total bilirubin 2.7, alk phos 191, AST 22, ALT 38.  His lactic acid was normal.  C. difficile quick screen negative, GI pathogen panel pending.  Lipase normal.  Febrile overnight.  Blood cultures pending.  In the past 12 hours he has had 2 small stools.  At  home he typically takes Lomotil 1 tablet every time he eats.  No significant improvement in symptoms.   Prior to Admission medications   Medication Sig Start Date End Date Taking? Authorizing Provider  acetaminophen (TYLENOL) 500 MG tablet Take 1,000 mg by mouth every 6 (six) hours as needed for mild pain or moderate pain.    Yes [provider]  clonazePAM (KLONOPIN) 0.5 MG tablet Take 1 tablet (0.5 mg total) by mouth 2 (two) times daily as needed. 01/26/18  Yes Lockamy, Randi L, NP-C  diphenoxylate-atropine (LOMOTIL) 2.5-0.025 MG tablet Take 1 tablet by mouth 4 (four) times daily as needed for diarrhea or loose stools. 08/10/18  Yes Derek Jack, MD  gabapentin (NEURONTIN) 300 MG capsule Take 3 capsules (900 mg total) by mouth 2 (two) times daily. 07/23/18  Yes Derek Jack, MD  glimepiride (AMARYL) 2 MG tablet Take 2 mg by mouth daily with breakfast.    Yes [provider]  omeprazole (PRILOSEC) 20 MG capsule Take 20 mg by mouth 2 (two) times daily before a meal.    Yes [provider]  ondansetron (ZOFRAN) 8 MG tablet Take 1 tablet (8 mg total) by mouth 2 (two) times daily as needed for refractory nausea / vomiting. Start on day 3 after chemotherapy. 01/20/17  Yes Twana First, MD  oxyCODONE-acetaminophen (PERCOCET) 10-325 MG tablet Take 1 tablet by mouth every 6 (six) hours as needed for pain. 09/21/18  Yes Derek Jack, MD  Probiotic Product (PROBIOTIC DAILY PO) Take 1 capsule by mouth daily.   Yes [provider]  rivaroxaban (XARELTO) 20 MG TABS tablet Take 1 tablet (20 mg total) by mouth daily with supper. 09/19/18  Yes Rehman, Mechele Dawley, MD  traZODone (DESYREL) 100 MG tablet Take 1 tablet (100 mg total) by mouth at bedtime. 09/20/18  Yes Derek Jack, MD  vitamin B-12 (CYANOCOBALAMIN) 1000 MCG tablet Take 1,000 mcg by mouth daily.   Yes [provider]  mirtazapine (REMERON) 7.5 MG tablet Take 1 tablet (7.5 mg total) by  mouth at bedtime. Patient not taking: Reported on 09/22/2018 06/22/18   Glennie Isle, NP-C    Current Facility-Administered Medications  Medication Dose Route Frequency Provider Last Rate Last Dose  . 0.9 % NaCl with KCl 40 mEq / L  infusion   Intravenous Continuous Reubin Milan, MD 100 mL/hr at 09/22/18 2204 100 mL/hr at 09/22/18 2204  . clonazePAM (KLONOPIN) tablet 0.5 mg  0.5 mg Oral BID PRN Reubin Milan, MD      . feeding supplement (BOOST / RESOURCE BREEZE) liquid 1 Container  1 Container Oral TID BM Reubin Milan, MD      . gabapentin (NEURONTIN) capsule 900 mg  900 mg Oral BID Reubin Milan, MD   900 mg at 09/22/18 2203  . glimepiride (AMARYL) tablet 2 mg  2 mg Oral Q breakfast Reubin Milan, MD      . HYDROmorphone (DILAUDID) injection 0.5 mg  0.5 mg Intravenous Q3H PRN Reubin Milan, MD   0.5 mg at 09/23/18 0533  . insulin aspart (novoLOG) injection 0-9 Units  0-9 Units Subcutaneous TID WC Reubin Milan, MD      .  ondansetron (ZOFRAN) injection 4 mg  4 mg Intravenous Once Reubin Milan, MD      . ondansetron [facility]) tablet 8 mg  8 mg Oral BID PRN Reubin Milan, MD      . oxyCODONE-acetaminophen (PERCOCET/ROXICET) 5-325 MG per tablet 1 tablet  1 tablet Oral Q6H PRN Reubin Milan, MD       And  . oxyCODONE (Oxy IR/ROXICODONE) immediate release tablet 5 mg  5 mg Oral Q6H PRN Reubin Milan, MD      . pantoprazole ([place]) EC tablet 40 mg  40 mg Oral Daily Reubin Milan, MD      . prochlorperazine (COMPAZINE) injection 5 mg  5 mg Intravenous Q4H PRN Reubin Milan, MD      . rivaroxaban Alveda Reasons) tablet 20 mg  20 mg Oral Q supper Reubin Milan, MD   20 mg at 09/22/18 2203  . traZODone (DESYREL) tablet 100 mg  100 mg Oral QHS Reubin Milan, MD   100 mg at 09/22/18 2203   Facility-Administered Medications Ordered in Other Encounters  Medication Dose Route Frequency Provider Last Rate Last Dose   . sodium chloride 0.9 % injection 10 mL  10 mL Intravenous PRN Everardo All, MD   10 mL at 08/22/11 1114  . sodium chloride flush (NS) 0.9 % injection 10 mL  10 mL Intracatheter PRN Ardath Sax, MD   10 mL at 05/08/17 1127    Allergies as of 09/22/2018 - Review Complete 09/22/2018  Allergen Reaction Noted  . Oxycodone  11/23/2014  . Tramadol  11/23/2014  . Trazodone and nefazodone Other (See Comments) 09/08/2014    Past Medical History:  Diagnosis Date  . Chronic anticoagulation   . Colon cancer ([place]) 07/24/2010   rectal ca, inv adenocarcinoma  . Depression 04/01/2011  . Diabetes mellitus without complication ([place])   . DVT (deep venous thrombosis) ([place]) 05/09/2011  . GERD (gastroesophageal reflux disease)   . High output ileostomy ([place]) 05/21/2011  . History of kidney stones   . HTN (hypertension)   . Hx of radiation therapy 09/02/10 to 10/14/10   pelvis  . Lung metastasis ([place])   . Neuropathy   . Peripheral vascular disease ([place])    dvt's,pe  . Pneumonia    hx x3  . Pulmonary embolism ([place])   . Rectal cancer ([place]) 01/15/2011   S/P radiation and concurrent 5-FU continuous infusion from 09/09/10- 10/10/10.  S/P proctectomy with colorectal anastomosis and diverting loop ileostomy on 11/14/10 at [facility] by Dr. Harlon Ditty. Pathology reveals a pT3b N1 with 3/20 lymph nodes.      Past Surgical History:  Procedure Laterality Date  . BILIARY STENT PLACEMENT N/A 09/16/2018   Procedure: STENT PLACEMENT;  Surgeon: Rogene Houston, MD;  [facility];  Service: Endoscopy;  Laterality: N/A;  . COLON SURGERY  11/14/2010   proctectomy with colorectal anastomosis and diverting loop ileostomy (temporary planned)  . COLONOSCOPY  07/2010   proximal rectal apple core mass 10-14cm from anal verge (adenocarcinoma), 2-3cm distal rectal carpet polyp s/p piecemeal snare polypectomy (adenoma)  . COLONOSCOPY  04/21/2012   RMR: Friable,fibrotic appearing colorectal anastomosis producing  some luminal narrowing-not felt to be critical. path: focal erosion with slight inflammation and hyperemia. SURVEILLANCE DUE DEC 2015  . COLONOSCOPY N/A 11/24/2013   Dr. Rourk:somewhat fibrotic/friable anastomotic mucosal-status post biopsy (narrowing not felt to be clinically significant) Single colonic diverticulum. benign polypoid rectal mucosa  . colostomy reversal  april  2013  . ERCP N/A 09/16/2018   Procedure: ENDOSCOPIC RETROGRADE CHOLANGIOPANCREATOGRAPHY (ERCP);  Surgeon: Rogene Houston, MD;  [facility];  Service: Endoscopy;  Laterality: N/A;  . ESOPHAGOGASTRODUODENOSCOPY  07/2010   RMR: schatki ring s/p dilation, small hh, SB bx benign  . ESOPHAGOGASTRODUODENOSCOPY N/A 09/04/2015   Procedure: ESOPHAGOGASTRODUODENOSCOPY (EGD);  Surgeon: Daneil Dolin, MD;  [facility];  Service: Endoscopy;  Laterality: N/A;  730  . EUS  08/2010   Dr. Owens Loffler. uT3N0 circumferential, nearly obstruction rectosigmoid adenocarcinoma, distal edge 12cm from anal verge  . EUS N/A 06/19/2016   Procedure: UPPER ENDOSCOPIC ULTRASOUND (EUS) RADIAL;  Surgeon: Milus Banister, MD;  Location: [facility];  Service: Endoscopy;  Laterality: N/A;  . HERNIA REPAIR     abd hernia repair  . IVC filter    . ivc filter    . port a cath placement    . PORT-A-CATH REMOVAL  09/24/2011   Procedure: REMOVAL PORT-A-CATH;  Surgeon: Donato Heinz, MD;  Location: [facility];  Service: General;  Laterality: N/A;  Minor Room  . PORTACATH PLACEMENT Right 07/07/2016   Procedure: INSERTION PORT-A-CATH;  Surgeon: Aviva Signs, MD;  Location: [facility];  Service: General;  Laterality: Right;  . SPHINCTEROTOMY N/A 09/16/2018   Procedure: SPHINCTEROTOMY with balloon dialation;  Surgeon: Rogene Houston, MD;  [facility];  Service: Endoscopy;  Laterality: N/A;  . VIDEO ASSISTED THORACOSCOPY (VATS)/WEDGE RESECTION Right 11/15/2014   Procedure: VIDEO ASSISTED THORACOSCOPY (VATS)/LUNG RESECTION WITH RIGHT  LINGULECTOMY;  Surgeon: [name], MD;  Location: [place];  Service: Thoracic;  Laterality: Right;  Marland Kitchen VIDEO BRONCHOSCOPY N/A 11/15/2014   Procedure: VIDEO BRONCHOSCOPY;  Surgeon: [name], MD;  Location: [facility] OR;  Service: Thoracic;  Laterality: N/A;    Family History  Problem Relation Age of Onset  . Cancer Brother        throat  . Cancer Brother        prostate  . Colon cancer Neg Hx   . Liver disease Neg Hx   . Inflammatory bowel disease Neg Hx     Social History   Socioeconomic History  . Marital status: Married    Spouse name: Not on file  . Number of children: 2  . Years of education: Not on file  . Highest education level: Not on file  Occupational History  . Occupation: self-employed Regulatory affairs officer, currently not working    Fish farm manager: SELF EMPLOYED  Social Needs  . Financial resource strain: Not on file  . Food insecurity:    Worry: Not on file    Inability: Not on file  . Transportation needs:    Medical: Not on file    Non-medical: Not on file  Tobacco Use  . Smoking status: Never Smoker  . Smokeless tobacco: Never Used  Substance and Sexual Activity  . Alcohol use: No  . Drug use: No  . Sexual activity: Yes    Birth control/protection: None    Comment: married  Lifestyle  . Physical activity:    Days per week: Not on file    Minutes per session: Not on file  . Stress: Not on file  Relationships  . Social connections:    Talks on phone: Not on file    Gets together: Not on file    Attends religious service: Not on file    Active member of club or organization: Not on file    Attends meetings of clubs  or organizations: Not on file    Relationship status: Not on file  . Intimate partner violence:    Fear of current or ex partner: Not on file    Emotionally abused: Not on file    Physically abused: Not on file    Forced sexual activity: Not on file  Other Topics Concern  . Not on file  Social History Narrative  . Not on  file     ROS:  General: Negative for anorexia, weight loss, chills, fatigue, positive weakness.  Positive fever Eyes: Negative for vision changes.  ENT: Negative for hoarseness, difficulty swallowing , nasal congestion. CV: Negative for chest pain, angina, palpitations, dyspnea on exertion, peripheral edema.  Respiratory: Negative for dyspnea at rest, dyspnea on exertion, cough, sputum, wheezing.  GI: See history of present illness. GU:  Negative for dysuria, hematuria, urinary incontinence, urinary frequency, nocturnal urination.  See HPI MS: Negative for joint pain, low back pain.  See HPI Derm: Negative for rash or itching.  Neuro: Negative for weakness, abnormal sensation, seizure, frequent headaches, memory loss, confusion.  Psych: Negative for anxiety, depression, suicidal ideation, hallucinations.  Endo: Negative for unusual weight change.  Heme: Negative for bruising or bleeding. Allergy: Negative for rash or hives.       Physical Examination: Vital signs in last 24 hours: Temp:  [97.9 F (36.6 C)-101 F (38.3 C)] 99.5 F (37.5 C) (05/21 0544) Pulse Rate:  [67-118] 98 (05/21 0544) Resp:  [17-24] 20 (05/21 0544) BP: (129-168)/(64-104) 151/82 (05/21 0544) SpO2:  [94 %-100 %] 96 % (05/21 0544) Weight:  [39.8 kg-87.5 kg] 87.5 kg (05/20 1658) Last BM Date: 09/22/18  General: Well-nourished, well-developed in no acute distress.  Pleasant and cooperative Head: Normocephalic, atraumatic.   Eyes: Conjunctiva pink, no icterus. Mouth: Oropharyngeal mucosa moist and pink , no lesions erythema or exudate. Neck: Supple without thyromegaly, masses, or lymphadenopathy.  Lungs: Clear to auscultation bilaterally.  Heart: Regular rate and rhythm, no murmurs rubs or gallops.  Abdomen: Bowel sounds are normal,   nondistended, no hepatosplenomegaly or masses, no abdominal bruits or    hernia , no rebound or guarding.  Tenderness with palpation of the right lower rib cage margin near the  epigastrium with some tenderness in the right upper quadrant.  No rebound or guarding. Rectal: Not performed Extremities: No lower extremity edema, clubbing, deformity.  Neuro: Alert and oriented x 4 , grossly normal neurologically.  Skin: Warm and dry, no rash or jaundice.   Psych: Alert and cooperative, normal mood and affect.        Intake/Output from previous day: 05/20 0701 - 05/21 0700 In: 2088.7 [I.V.:1002.2; IV Piggyback:1086.4] Out: 450 [Urine:450] Intake/Output this shift: No intake/output data recorded.  Lab Results: CBC Recent Labs    09/22/18 1529  WBC 9.0  HGB 13.6  HCT 40.6  MCV 99.3  PLT 236   BMET Recent Labs    09/22/18 1529 09/23/18 0552  NA 138 139  K 3.4* 3.8  CL 106 107  CO2 20* 22  GLUCOSE 203* 159*  BUN 9 7*  CREATININE 0.66 0.62  CALCIUM 8.7* 8.1*   LFT Recent Labs    09/20/18 1218 09/22/18 1529 09/23/18 0552  BILITOT 2.3* 2.3* 2.7*  BILIDIR 0.9*  --   --   IBILI 1.4*  --   --   ALKPHOS 271* 237* 191*  AST 32 29 22  ALT 78* 50* 38  PROT 6.5 6.2* 5.5*  ALBUMIN 3.4* 3.4* 2.8*  Lipase Recent Labs    09/22/18 1529  LIPASE 17    PT/INR No results for input(s): LABPROT, INR in the last 72 hours.    Imaging Studies: Dg Chest 2 View  Result Date: 09/22/2018 CLINICAL DATA:  Diarrhea central chest pain EXAM: CHEST - 2 VIEW COMPARISON:  03/22/2018, CT chest 07/30/2018 FINDINGS: Right-sided central venous port tip over the SVC. Chronic elevation of right diaphragm with scarring at the right middle lobe. CT demonstrated right upper lobe lung nodules not well seen. Stable cardiomediastinal silhouette. No pneumothorax. IMPRESSION: No active cardiopulmonary disease. Stable right middle lobe scarring. Electronically Signed   By: Donavan Foil M.D.   On: 09/22/2018 16:59   Ct Abdomen Pelvis W Contrast  Result Date: 09/22/2018 CLINICAL DATA:  Stage IV rectal cancer with pancreatic metastases. Status post common bile duct stent  placement. EXAM: CT ABDOMEN AND PELVIS WITH CONTRAST TECHNIQUE: Multidetector CT imaging of the abdomen and pelvis was performed using the standard protocol following bolus administration of intravenous contrast. CONTRAST:  127m OMNIPAQUE IOHEXOL 300 MG/ML  SOLN COMPARISON:  MRI 09/10/2018.  CT scan 07/30/2018. FINDINGS: Lower chest: The heart size is normal. No substantial pericardial effusion. Coronary artery calcification is evident. Stable 7 mm anterior right lung nodule. Atelectasis noted lower lobes bilaterally. Hepatobiliary: No suspicious focal abnormality within the liver parenchyma. Gallbladder is distended with ill-defined gallbladder wall suggesting edema. Pneumobilia is compatible with the presence of the common bile duct stent. Pancreas: Diffuse pancreatic parenchymal atrophy with associated dilatation of the main pancreatic duct. Imaging features similar to prior with abrupt cut off at the level of the pancreatic head. 2.0 cm hypoenhancing lesion in the posterior pancreatic head is similar to prior MRI when it was measured at 2.2 cm. Spleen: No splenomegaly. No focal mass lesion. Adrenals/Urinary Tract: Stable adrenal glands bilaterally without mass lesion. Kidneys unremarkable. No evidence for hydroureter. The urinary bladder appears normal for the degree of distention. Stomach/Bowel: Stomach is distended with fluid and food material. Duodenum is normally positioned as is the ligament of Treitz. No small bowel wall thickening. No small bowel dilatation. No small bowel wall thickening. No small bowel dilatation. The terminal ileum is normal. Appendicoliths noted in the tip of the appendix without periappendiceal edema or inflammation. Anastomotic staple line noted at the rectosigmoid junction with circumferential wall thickening in the rectum, progressed since 07/30/2018. Vascular/Lymphatic: There is abdominal aortic atherosclerosis without aneurysm. IVC filter identified in situ. Stranding in the  hepato duodenal ligament may be related to edema. There is no gastrohepatic or hepatoduodenal ligament lymphadenopathy. No intraperitoneal or retroperitoneal lymphadenopathy. The 8 mm short axis celiac axis lymph node is stable and upper normal for size. Similar upper normal lymph nodes are seen in the portal caval space. No pelvic sidewall lymphadenopathy. Reproductive: The prostate gland and seminal vesicles are unremarkable. Other: No intraperitoneal free fluid. Musculoskeletal: Bilateral groin hernias contain only fat. No worrisome lytic or sclerotic osseous abnormality. IMPRESSION: 1. Marked interval increase in circumferential wall thickening in the rectum, adjacent to the anastomosis. Recurrent disease a distinct concern. 2. Stomach is distended with fluid and food material. No associated small bowel dilatation. No obstructing lesion to suggest gastric outlet obstruction by CT. 3. Gallbladder is distended with apparent gallbladder wall edema. Cholecystitis a concern by imaging. 4. Stable appearance of the posterior pancreatic head lesion with diffuse pancreatic parenchymal atrophy and chronic dilatation of the main pancreatic duct. By report, the pancreatic head lesion represents metastatic colorectal cancer. 5. Stable upper normal lymph nodes  in the celiac axis. 6.  Aortic Atherosclerois (ICD10-170.0) 7. Stable anterior right upper lobe pulmonary nodule. Electronically Signed   By: Misty Stanley M.D.   On: 09/22/2018 17:30   Mr 3d Recon At Scanner  Result Date: 09/10/2018 CLINICAL DATA:  Right upper quadrant abdominal pain. 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 EXAM: MRI ABDOMEN WITHOUT AND WITH CONTRAST (INCLUDING MRCP) TECHNIQUE: Multiplanar multisequence MR imaging of the abdomen was performed both before and after the administration of intravenous contrast. Heavily T2-weighted images of the biliary and pancreatic ducts were obtained, and three-dimensional MRCP images were  rendered by post processing. CONTRAST:  9 mL Gadovist IV COMPARISON:  Right upper quadrant ultrasound dated 09/10/2018 FINDINGS: Lower chest: Lungs are essentially clear in this patient with known pulmonary nodules on prior CT. Hepatobiliary: Liver is within normal limits. No suspicious/enhancing hepatic lesions. Mildly distended gallbladder.  No layering gallstones. Mild intrahepatic ductal dilatation in the left hepatic lobe (series 5006/image 19). Common duct measures 8 mm, mildly prominent. No choledocholithiasis is seen. Pancreas: Chronic dilatation of the main pancreatic duct, measuring up to 11 mm, with associated atrophy of the pancreatic body/tail. Underlying 1.3 x 2.1 cm obstructing mass in the pancreatic head, previously 1.7 cm on CT. Spleen:  Within normal limits. Adrenals/Urinary Tract:  Adrenal glands are within normal limits. Kidneys are notable for bilateral renal sinus cysts. No hydronephrosis. Stomach/Bowel: Stomach is within normal limits. Visualized bowel is unremarkable. Vascular/Lymphatic:  No evidence of abdominal aortic aneurysm. No suspicious abdominal lymphadenopathy. Other:  No abdominal ascites. Musculoskeletal: No focal osseous lesions. IMPRESSION: 1.3 x 2.1 cm mass in the pancreatic head, mildly increased. Reportedly, this reflects a pancreatic metastasis, although primary pancreatic neoplasm could have a similar appearance. Associated chronic dilatation of the main pancreatic duct with atrophy of the pancreatic body/tail. Secondary mild intrahepatic and extrahepatic ductal dilatation. Common duct measures 8 mm. No choledocholithiasis is seen. Electronically Signed   By: Julian Hy M.D.   On: 09/10/2018 18:07   Dg Ercp  Result Date: 09/16/2018 CLINICAL DATA:  Obstructive jaundice. ERCP with balloon dilatation and biliary stent placement. EXAM: ERCP TECHNIQUE: Multiple spot images obtained with the fluoroscopic device and submitted for interpretation post-procedure.  COMPARISON:  MRCP-09/10/2018; CT abdomen pelvis-07/30/2018 FLUOROSCOPY TIME:  2 minutes, 48 seconds FINDINGS: Six spot fluoroscopic images of the right upper abdominal quadrant during ERCP are provided for review Initial image demonstrates an ERCP probe overlying the right upper abdominal quadrant. Incidentally noted IVC filter. There is selective cannulation and opacification of the common bile duct which appears to demonstrate malignant narrowing/occlusion of its distal aspect. Subsequent images demonstrate balloon dilatation of the distal aspect of the CBD. Completion image demonstrates placement of an internal biliary stent overlying the expected location of the mid and distal aspect of the CBD. IMPRESSION: ERCP with biliary dilatation and stent placement as detailed above. These images were submitted for radiologic interpretation only. Please see the procedural report for the amount of contrast and the fluoroscopy time utilized. Electronically Signed   By: Sandi Mariscal M.D.   On: 09/16/2018 14:38   Mr Abdomen Mrcp Moise Boring Contast  Result Date: 09/10/2018 CLINICAL DATA:  Right upper quadrant abdominal pain. 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 EXAM: MRI ABDOMEN WITHOUT AND WITH CONTRAST (INCLUDING MRCP) TECHNIQUE: Multiplanar multisequence MR imaging of the abdomen was performed both before and after the administration of intravenous contrast. Heavily T2-weighted images of the biliary and pancreatic ducts were obtained, and three-dimensional MRCP  images were rendered by post processing. CONTRAST:  9 mL Gadovist IV COMPARISON:  Right upper quadrant ultrasound dated 09/10/2018 FINDINGS: Lower chest: Lungs are essentially clear in this patient with known pulmonary nodules on prior CT. Hepatobiliary: Liver is within normal limits. No suspicious/enhancing hepatic lesions. Mildly distended gallbladder.  No layering gallstones. Mild intrahepatic ductal dilatation in the left hepatic lobe (series  5006/image 19). Common duct measures 8 mm, mildly prominent. No choledocholithiasis is seen. Pancreas: Chronic dilatation of the main pancreatic duct, measuring up to 11 mm, with associated atrophy of the pancreatic body/tail. Underlying 1.3 x 2.1 cm obstructing mass in the pancreatic head, previously 1.7 cm on CT. Spleen:  Within normal limits. Adrenals/Urinary Tract:  Adrenal glands are within normal limits. Kidneys are notable for bilateral renal sinus cysts. No hydronephrosis. Stomach/Bowel: Stomach is within normal limits. Visualized bowel is unremarkable. Vascular/Lymphatic:  No evidence of abdominal aortic aneurysm. No suspicious abdominal lymphadenopathy. Other:  No abdominal ascites. Musculoskeletal: No focal osseous lesions. IMPRESSION: 1.3 x 2.1 cm mass in the pancreatic head, mildly increased. Reportedly, this reflects a pancreatic metastasis, although primary pancreatic neoplasm could have a similar appearance. Associated chronic dilatation of the main pancreatic duct with atrophy of the pancreatic body/tail. Secondary mild intrahepatic and extrahepatic ductal dilatation. Common duct measures 8 mm. No choledocholithiasis is seen. Electronically Signed   By: Julian Hy M.D.   On: 09/10/2018 18:07   US Abdomen Limited Ruq  Result Date: 09/10/2018 CLINICAL DATA:  Right upper quadrant abdominal pain for 1 year. History rectal cancer. EXAM: ULTRASOUND ABDOMEN LIMITED RIGHT UPPER QUADRANT COMPARISON:  07/30/2018 CT abdomen/pelvis. FINDINGS: Gallbladder: Small amount of layering sludge in mildly distended gallbladder. Mild diffuse gallbladder wall thickening (4 mm gallbladder wall thickness). No cholelithiasis. No sonographic Murphy sign. No pericholecystic fluid. Common bile duct: Diameter: 7 mm Liver: Liver parenchyma is diffusely mildly echogenic. No liver mass detected, noting decreased sensitivity in the setting of an echogenic liver. Portal vein is patent on color Doppler imaging with normal  direction of blood flow towards the liver. IMPRESSION: 1. Mildly distended gallbladder. No cholelithiasis. Layering gallbladder sludge. No pericholecystic fluid. No sonographic Murphy's sign. Findings are nonspecific, most likely chronic. If there is clinical concern for acute acalculous cholecystitis, hepatobiliary scintigraphy study could be obtained for further evaluation. 2. Mildly dilated common bile duct (7 mm diameter), slightly increased from 07/30/2018 CT abdomen study. A cause of biliary obstruction is not identified on this ultrasound study. Recommend correlation with serum bilirubin levels. MRI abdomen with MRCP without and with IV contrast could be obtained for further evaluation as clinically warranted. 3. Nonspecific echogenic liver parenchyma, which could be due to hepatic steatosis and/or fibrosis. No liver masses, noting decreased sensitivity in the setting of an echogenic liver. Electronically Signed   By: Ilona Sorrel M.D.   On: 09/10/2018 12:41  [4 week]   Impression: Pleasant 68 year old gentleman with history of metastatic colon cancer with metastasis to the pancreatic head creating biliary obstruction requiring recent covered metal stent placement to the common bile duct on May 14.  Prior to stenting he was noted to have mildly dilated gallbladder with diffuse gallbladder wall thickening which again is noted on current imaging (CT) yesterday.  LFTs overall improved post biliary stenting.  Patient febrile overnight.  Source is unclear at this time.  Patient has chronic right upper quadrant/right lower chest pain for 6 months, worse the last 3 months.  On exam he has tenderness with palpation over the right lower rib cage.  He denies a postprandial component, more discomfort with movement.  Not clearly biliary in nature.  CT demonstrated circumferential wall thickening in the rectum adjacent to the anastomosis.  Last colonoscopy July 2015 with somewhat fibrotic/friable anastomotic mucosa  with benign biopsies.  Acute on chronic diarrhea with nocturnal diarrhea  Plan: 1. Await pending stool studies. 2. Consider outpatient colonoscopy to evaluate CT findings. 3. Follow-up blood cultures.  We would like to thank you for the opportunity to participate in the care of Charles [name].  Laureen Ochs. Bernarda Caffey [facility] [phone] 5/21/20209:52 AM     LOS: 0 days

## 2018-09-23 NOTE — Consult Note (Signed)
Referring Provider: Rodena Goldmann, DO Primary Care Physician:  [name], MD Primary Gastroenterologist:  Garfield Cornea, MD  Reason for Consultation:  S/p ERCP, colon cancer, diarrhea, ?cholecystitis  HPI: Charles [name] is a 68 y.o. male with past medical history significant for metastatic colon carcinoma the pancreas receiving chemotherapy, diabetes, GERD, DVTs/PE on Xarelto, recent ERCP with covered metal stent placement for CBD stricture who is coming into the ED for persistent diarrhea of several days, weakness and dehydration.    Patient's original diagnosis of rectal carcinoma in 2012 status post chemoradiation followed by surgery with ileostomy which subsequently was taken down. He had more postop chemotherapy and was disease-free until 2016 when he was found to have mets to the lung.  He underwent lung resection.  Since then he has had recurrence to the pancreas and currently receiving chemotherapy, last dose 5 weeks ago.    Patient has a chronic history of diarrhea for years, upwards to 10-12 stools daily at times including at least 3 episodes of nocturnal diarrhea.  On his best days he may have 3-4 stools.  Rarely has a solid stool.  Patient was seen on May 14 when he was noted to have abnormal LFTs found to have a dilated biliary system with distal cutoff/obstruction.  Gallbladder distended on MRCP.  He was found to have obstructive jaundice secondary to metastatic colon cancer involving pancreatic head.  Patient underwent ERCP on May 14 by Dr. Laural Golden was found to have a single localized biliary stricture due to metastatic disease in the common bile duct, upstream biliary dilatation noted.  He performed a sphincterotomy with dilation of the common bile duct to allow placement of a covered metal stent.   Patient tells me that he has had right lower chest pain/right upper quadrant pain for about 6 months but is been worse the last 3 months.  Pain is constant, does not worsen with  meals.  Recently his pain has worsened.  Prior to his biliary stenting he had pain predominantly in the right upper quadrant/right lower chest and radiated into his back.  There was improvement after stenting.  4 days prior to admission he developed worsening abdominal pain not controlled with pain medication.  His pain medication was increased.  He presented to the ED yesterday with complaints of severe diarrhea for the past 4 days.  Episode of vomiting that looked like a BM, but believes it was the chocolate Ensure, did not smell like stool. Having 8-12 loose stools daily associated, occurring every time he eats.  He notes decreased stooling with fasting. No melena or rectal bleeding.  Felt like he was urinating less.  Has had some dizziness, headache.  CT abdomen pelvis with contrast yesterday, no suspicious focal abnormality within the liver parenchyma.  Gallbladder distended with ill-defined gallbladder wall suggesting edema.  Pneumobilia present consistent with previously placed common bile duct stent.  Marked interval increase in circumferential wall thickening in the rectum, adjacent to the anastomosis compared to March 2020.  Stomach distended with fluid and food material but no evidence of obstruction.  Stable appearance of posterior pancreatic head lesion with diffuse parenchymal atrophy and chronic dilatation of the main pancreatic duct.  Initial increase in LFTs noted on May 8, total bilirubin 3.4, alkaline phosphatase 348, AST 290, ALT 332.  On May 18 (4 days after biliary stenting), total bilirubin 2.3, alkaline phosphatase 271, AST 32, ALT 78.  Yesterday's total bilirubin was 2.3, alk phos 237, AST 29, ALT 50.  White improvement today with total bilirubin 2.7, alk phos 191, AST 22, ALT 38.  His lactic acid was normal.  C. difficile quick screen negative, GI pathogen panel pending.  Lipase normal.  Febrile overnight.  Blood cultures pending.  In the past 12 hours he has had 2 small stools.  At  home he typically takes Lomotil 1 tablet every time he eats.  No significant improvement in symptoms.   Prior to Admission medications   Medication Sig Start Date End Date Taking? Authorizing Provider  acetaminophen (TYLENOL) 500 MG tablet Take 1,000 mg by mouth every 6 (six) hours as needed for mild pain or moderate pain.    Yes [provider]  clonazePAM (KLONOPIN) 0.5 MG tablet Take 1 tablet (0.5 mg total) by mouth 2 (two) times daily as needed. 01/26/18  Yes Lockamy, Randi L, NP-C  diphenoxylate-atropine (LOMOTIL) 2.5-0.025 MG tablet Take 1 tablet by mouth 4 (four) times daily as needed for diarrhea or loose stools. 08/10/18  Yes Derek Jack, MD  gabapentin (NEURONTIN) 300 MG capsule Take 3 capsules (900 mg total) by mouth 2 (two) times daily. 07/23/18  Yes Derek Jack, MD  glimepiride (AMARYL) 2 MG tablet Take 2 mg by mouth daily with breakfast.    Yes [provider]  omeprazole (PRILOSEC) 20 MG capsule Take 20 mg by mouth 2 (two) times daily before a meal.    Yes [provider]  ondansetron (ZOFRAN) 8 MG tablet Take 1 tablet (8 mg total) by mouth 2 (two) times daily as needed for refractory nausea / vomiting. Start on day 3 after chemotherapy. 01/20/17  Yes Twana First, MD  oxyCODONE-acetaminophen (PERCOCET) 10-325 MG tablet Take 1 tablet by mouth every 6 (six) hours as needed for pain. 09/21/18  Yes Derek Jack, MD  Probiotic Product (PROBIOTIC DAILY PO) Take 1 capsule by mouth daily.   Yes [provider]  rivaroxaban (XARELTO) 20 MG TABS tablet Take 1 tablet (20 mg total) by mouth daily with supper. 09/19/18  Yes Rehman, Mechele Dawley, MD  traZODone (DESYREL) 100 MG tablet Take 1 tablet (100 mg total) by mouth at bedtime. 09/20/18  Yes Derek Jack, MD  vitamin B-12 (CYANOCOBALAMIN) 1000 MCG tablet Take 1,000 mcg by mouth daily.   Yes [provider]  mirtazapine (REMERON) 7.5 MG tablet Take 1 tablet (7.5 mg total) by  mouth at bedtime. Patient not taking: Reported on 09/22/2018 06/22/18   Glennie Isle, NP-C    Current Facility-Administered Medications  Medication Dose Route Frequency Provider Last Rate Last Dose  . 0.9 % NaCl with KCl 40 mEq / L  infusion   Intravenous Continuous Reubin Milan, MD 100 mL/hr at 09/22/18 2204 100 mL/hr at 09/22/18 2204  . clonazePAM (KLONOPIN) tablet 0.5 mg  0.5 mg Oral BID PRN Reubin Milan, MD      . feeding supplement (BOOST / RESOURCE BREEZE) liquid 1 Container  1 Container Oral TID BM Reubin Milan, MD      . gabapentin (NEURONTIN) capsule 900 mg  900 mg Oral BID Reubin Milan, MD   900 mg at 09/22/18 2203  . glimepiride (AMARYL) tablet 2 mg  2 mg Oral Q breakfast Reubin Milan, MD      . HYDROmorphone (DILAUDID) injection 0.5 mg  0.5 mg Intravenous Q3H PRN Reubin Milan, MD   0.5 mg at 09/23/18 0533  . insulin aspart (novoLOG) injection 0-9 Units  0-9 Units Subcutaneous TID WC Reubin Milan, MD      .  ondansetron (ZOFRAN) injection 4 mg  4 mg Intravenous Once Reubin Milan, MD      . ondansetron [facility]) tablet 8 mg  8 mg Oral BID PRN Reubin Milan, MD      . oxyCODONE-acetaminophen (PERCOCET/ROXICET) 5-325 MG per tablet 1 tablet  1 tablet Oral Q6H PRN Reubin Milan, MD       And  . oxyCODONE (Oxy IR/ROXICODONE) immediate release tablet 5 mg  5 mg Oral Q6H PRN Reubin Milan, MD      . pantoprazole ([place]) EC tablet 40 mg  40 mg Oral Daily Reubin Milan, MD      . prochlorperazine (COMPAZINE) injection 5 mg  5 mg Intravenous Q4H PRN Reubin Milan, MD      . rivaroxaban Alveda Reasons) tablet 20 mg  20 mg Oral Q supper Reubin Milan, MD   20 mg at 09/22/18 2203  . traZODone (DESYREL) tablet 100 mg  100 mg Oral QHS Reubin Milan, MD   100 mg at 09/22/18 2203   Facility-Administered Medications Ordered in Other Encounters  Medication Dose Route Frequency Provider Last Rate Last Dose   . sodium chloride 0.9 % injection 10 mL  10 mL Intravenous PRN Everardo All, MD   10 mL at 08/22/11 1114  . sodium chloride flush (NS) 0.9 % injection 10 mL  10 mL Intracatheter PRN Ardath Sax, MD   10 mL at 05/08/17 1127    Allergies as of 09/22/2018 - Review Complete 09/22/2018  Allergen Reaction Noted  . Oxycodone  11/23/2014  . Tramadol  11/23/2014  . Trazodone and nefazodone Other (See Comments) 09/08/2014    Past Medical History:  Diagnosis Date  . Chronic anticoagulation   . Colon cancer ([place]) 07/24/2010   rectal ca, inv adenocarcinoma  . Depression 04/01/2011  . Diabetes mellitus without complication ([place])   . DVT (deep venous thrombosis) ([place]) 05/09/2011  . GERD (gastroesophageal reflux disease)   . High output ileostomy ([place]) 05/21/2011  . History of kidney stones   . HTN (hypertension)   . Hx of radiation therapy 09/02/10 to 10/14/10   pelvis  . Lung metastasis ([place])   . Neuropathy   . Peripheral vascular disease ([place])    dvt's,pe  . Pneumonia    hx x3  . Pulmonary embolism ([place])   . Rectal cancer ([place]) 01/15/2011   S/P radiation and concurrent 5-FU continuous infusion from 09/09/10- 10/10/10.  S/P proctectomy with colorectal anastomosis and diverting loop ileostomy on 11/14/10 at [facility] by Dr. Harlon Ditty. Pathology reveals a pT3b N1 with 3/20 lymph nodes.      Past Surgical History:  Procedure Laterality Date  . BILIARY STENT PLACEMENT N/A 09/16/2018   Procedure: STENT PLACEMENT;  Surgeon: Rogene Houston, MD;  [facility];  Service: Endoscopy;  Laterality: N/A;  . COLON SURGERY  11/14/2010   proctectomy with colorectal anastomosis and diverting loop ileostomy (temporary planned)  . COLONOSCOPY  07/2010   proximal rectal apple core mass 10-14cm from anal verge (adenocarcinoma), 2-3cm distal rectal carpet polyp s/p piecemeal snare polypectomy (adenoma)  . COLONOSCOPY  04/21/2012   RMR: Friable,fibrotic appearing colorectal anastomosis producing  some luminal narrowing-not felt to be critical. path: focal erosion with slight inflammation and hyperemia. SURVEILLANCE DUE DEC 2015  . COLONOSCOPY N/A 11/24/2013   Dr. Rourk:somewhat fibrotic/friable anastomotic mucosal-status post biopsy (narrowing not felt to be clinically significant) Single colonic diverticulum. benign polypoid rectal mucosa  . colostomy reversal  april  2013  . ERCP N/A 09/16/2018   Procedure: ENDOSCOPIC RETROGRADE CHOLANGIOPANCREATOGRAPHY (ERCP);  Surgeon: Rogene Houston, MD;  [facility];  Service: Endoscopy;  Laterality: N/A;  . ESOPHAGOGASTRODUODENOSCOPY  07/2010   RMR: schatki ring s/p dilation, small hh, SB bx benign  . ESOPHAGOGASTRODUODENOSCOPY N/A 09/04/2015   Procedure: ESOPHAGOGASTRODUODENOSCOPY (EGD);  Surgeon: Daneil Dolin, MD;  [facility];  Service: Endoscopy;  Laterality: N/A;  730  . EUS  08/2010   Dr. Owens Loffler. uT3N0 circumferential, nearly obstruction rectosigmoid adenocarcinoma, distal edge 12cm from anal verge  . EUS N/A 06/19/2016   Procedure: UPPER ENDOSCOPIC ULTRASOUND (EUS) RADIAL;  Surgeon: Milus Banister, MD;  Location: [facility];  Service: Endoscopy;  Laterality: N/A;  . HERNIA REPAIR     abd hernia repair  . IVC filter    . ivc filter    . port a cath placement    . PORT-A-CATH REMOVAL  09/24/2011   Procedure: REMOVAL PORT-A-CATH;  Surgeon: Donato Heinz, MD;  Location: [facility];  Service: General;  Laterality: N/A;  Minor Room  . PORTACATH PLACEMENT Right 07/07/2016   Procedure: INSERTION PORT-A-CATH;  Surgeon: Aviva Signs, MD;  Location: [facility];  Service: General;  Laterality: Right;  . SPHINCTEROTOMY N/A 09/16/2018   Procedure: SPHINCTEROTOMY with balloon dialation;  Surgeon: Rogene Houston, MD;  [facility];  Service: Endoscopy;  Laterality: N/A;  . VIDEO ASSISTED THORACOSCOPY (VATS)/WEDGE RESECTION Right 11/15/2014   Procedure: VIDEO ASSISTED THORACOSCOPY (VATS)/LUNG RESECTION WITH RIGHT  LINGULECTOMY;  Surgeon: [name], MD;  Location: [place];  Service: Thoracic;  Laterality: Right;  Marland Kitchen VIDEO BRONCHOSCOPY N/A 11/15/2014   Procedure: VIDEO BRONCHOSCOPY;  Surgeon: [name], MD;  Location: [facility] OR;  Service: Thoracic;  Laterality: N/A;    Family History  Problem Relation Age of Onset  . Cancer Brother        throat  . Cancer Brother        prostate  . Colon cancer Neg Hx   . Liver disease Neg Hx   . Inflammatory bowel disease Neg Hx     Social History   Socioeconomic History  . Marital status: Married    Spouse name: Not on file  . Number of children: 2  . Years of education: Not on file  . Highest education level: Not on file  Occupational History  . Occupation: self-employed Regulatory affairs officer, currently not working    Fish farm manager: SELF EMPLOYED  Social Needs  . Financial resource strain: Not on file  . Food insecurity:    Worry: Not on file    Inability: Not on file  . Transportation needs:    Medical: Not on file    Non-medical: Not on file  Tobacco Use  . Smoking status: Never Smoker  . Smokeless tobacco: Never Used  Substance and Sexual Activity  . Alcohol use: No  . Drug use: No  . Sexual activity: Yes    Birth control/protection: None    Comment: married  Lifestyle  . Physical activity:    Days per week: Not on file    Minutes per session: Not on file  . Stress: Not on file  Relationships  . Social connections:    Talks on phone: Not on file    Gets together: Not on file    Attends religious service: Not on file    Active member of club or organization: Not on file    Attends meetings of clubs  or organizations: Not on file    Relationship status: Not on file  . Intimate partner violence:    Fear of current or ex partner: Not on file    Emotionally abused: Not on file    Physically abused: Not on file    Forced sexual activity: Not on file  Other Topics Concern  . Not on file  Social History Narrative  . Not on  file     ROS:  General: Negative for anorexia, weight loss, chills, fatigue, positive weakness.  Positive fever Eyes: Negative for vision changes.  ENT: Negative for hoarseness, difficulty swallowing , nasal congestion. CV: Negative for chest pain, angina, palpitations, dyspnea on exertion, peripheral edema.  Respiratory: Negative for dyspnea at rest, dyspnea on exertion, cough, sputum, wheezing.  GI: See history of present illness. GU:  Negative for dysuria, hematuria, urinary incontinence, urinary frequency, nocturnal urination.  See HPI MS: Negative for joint pain, low back pain.  See HPI Derm: Negative for rash or itching.  Neuro: Negative for weakness, abnormal sensation, seizure, frequent headaches, memory loss, confusion.  Psych: Negative for anxiety, depression, suicidal ideation, hallucinations.  Endo: Negative for unusual weight change.  Heme: Negative for bruising or bleeding. Allergy: Negative for rash or hives.       Physical Examination: Vital signs in last 24 hours: Temp:  [97.9 F (36.6 C)-101 F (38.3 C)] 99.5 F (37.5 C) (05/21 0544) Pulse Rate:  [67-118] 98 (05/21 0544) Resp:  [17-24] 20 (05/21 0544) BP: (129-168)/(64-104) 151/82 (05/21 0544) SpO2:  [94 %-100 %] 96 % (05/21 0544) Weight:  [39.8 kg-87.5 kg] 87.5 kg (05/20 1658) Last BM Date: 09/22/18  General: Well-nourished, well-developed in no acute distress.  Pleasant and cooperative Head: Normocephalic, atraumatic.   Eyes: Conjunctiva pink, no icterus. Mouth: Oropharyngeal mucosa moist and pink , no lesions erythema or exudate. Neck: Supple without thyromegaly, masses, or lymphadenopathy.  Lungs: Clear to auscultation bilaterally.  Heart: Regular rate and rhythm, no murmurs rubs or gallops.  Abdomen: Bowel sounds are normal,   nondistended, no hepatosplenomegaly or masses, no abdominal bruits or    hernia , no rebound or guarding.  Tenderness with palpation of the right lower rib cage margin near the  epigastrium with some tenderness in the right upper quadrant.  No rebound or guarding. Rectal: Not performed Extremities: No lower extremity edema, clubbing, deformity.  Neuro: Alert and oriented x 4 , grossly normal neurologically.  Skin: Warm and dry, no rash or jaundice.   Psych: Alert and cooperative, normal mood and affect.        Intake/Output from previous day: 05/20 0701 - 05/21 0700 In: 2088.7 [I.V.:1002.2; IV Piggyback:1086.4] Out: 450 [Urine:450] Intake/Output this shift: No intake/output data recorded.  Lab Results: CBC Recent Labs    09/22/18 1529  WBC 9.0  HGB 13.6  HCT 40.6  MCV 99.3  PLT 236   BMET Recent Labs    09/22/18 1529 09/23/18 0552  NA 138 139  K 3.4* 3.8  CL 106 107  CO2 20* 22  GLUCOSE 203* 159*  BUN 9 7*  CREATININE 0.66 0.62  CALCIUM 8.7* 8.1*   LFT Recent Labs    09/20/18 1218 09/22/18 1529 09/23/18 0552  BILITOT 2.3* 2.3* 2.7*  BILIDIR 0.9*  --   --   IBILI 1.4*  --   --   ALKPHOS 271* 237* 191*  AST 32 29 22  ALT 78* 50* 38  PROT 6.5 6.2* 5.5*  ALBUMIN 3.4* 3.4* 2.8*  Lipase Recent Labs    09/22/18 1529  LIPASE 17    PT/INR No results for input(s): LABPROT, INR in the last 72 hours.    Imaging Studies: Dg Chest 2 View  Result Date: 09/22/2018 CLINICAL DATA:  Diarrhea central chest pain EXAM: CHEST - 2 VIEW COMPARISON:  03/22/2018, CT chest 07/30/2018 FINDINGS: Right-sided central venous port tip over the SVC. Chronic elevation of right diaphragm with scarring at the right middle lobe. CT demonstrated right upper lobe lung nodules not well seen. Stable cardiomediastinal silhouette. No pneumothorax. IMPRESSION: No active cardiopulmonary disease. Stable right middle lobe scarring. Electronically Signed   By: Donavan Foil M.D.   On: 09/22/2018 16:59   Ct Abdomen Pelvis W Contrast  Result Date: 09/22/2018 CLINICAL DATA:  Stage IV rectal cancer with pancreatic metastases. Status post common bile duct stent  placement. EXAM: CT ABDOMEN AND PELVIS WITH CONTRAST TECHNIQUE: Multidetector CT imaging of the abdomen and pelvis was performed using the standard protocol following bolus administration of intravenous contrast. CONTRAST:  171m OMNIPAQUE IOHEXOL 300 MG/ML  SOLN COMPARISON:  MRI 09/10/2018.  CT scan 07/30/2018. FINDINGS: Lower chest: The heart size is normal. No substantial pericardial effusion. Coronary artery calcification is evident. Stable 7 mm anterior right lung nodule. Atelectasis noted lower lobes bilaterally. Hepatobiliary: No suspicious focal abnormality within the liver parenchyma. Gallbladder is distended with ill-defined gallbladder wall suggesting edema. Pneumobilia is compatible with the presence of the common bile duct stent. Pancreas: Diffuse pancreatic parenchymal atrophy with associated dilatation of the main pancreatic duct. Imaging features similar to prior with abrupt cut off at the level of the pancreatic head. 2.0 cm hypoenhancing lesion in the posterior pancreatic head is similar to prior MRI when it was measured at 2.2 cm. Spleen: No splenomegaly. No focal mass lesion. Adrenals/Urinary Tract: Stable adrenal glands bilaterally without mass lesion. Kidneys unremarkable. No evidence for hydroureter. The urinary bladder appears normal for the degree of distention. Stomach/Bowel: Stomach is distended with fluid and food material. Duodenum is normally positioned as is the ligament of Treitz. No small bowel wall thickening. No small bowel dilatation. No small bowel wall thickening. No small bowel dilatation. The terminal ileum is normal. Appendicoliths noted in the tip of the appendix without periappendiceal edema or inflammation. Anastomotic staple line noted at the rectosigmoid junction with circumferential wall thickening in the rectum, progressed since 07/30/2018. Vascular/Lymphatic: There is abdominal aortic atherosclerosis without aneurysm. IVC filter identified in situ. Stranding in the  hepato duodenal ligament may be related to edema. There is no gastrohepatic or hepatoduodenal ligament lymphadenopathy. No intraperitoneal or retroperitoneal lymphadenopathy. The 8 mm short axis celiac axis lymph node is stable and upper normal for size. Similar upper normal lymph nodes are seen in the portal caval space. No pelvic sidewall lymphadenopathy. Reproductive: The prostate gland and seminal vesicles are unremarkable. Other: No intraperitoneal free fluid. Musculoskeletal: Bilateral groin hernias contain only fat. No worrisome lytic or sclerotic osseous abnormality. IMPRESSION: 1. Marked interval increase in circumferential wall thickening in the rectum, adjacent to the anastomosis. Recurrent disease a distinct concern. 2. Stomach is distended with fluid and food material. No associated small bowel dilatation. No obstructing lesion to suggest gastric outlet obstruction by CT. 3. Gallbladder is distended with apparent gallbladder wall edema. Cholecystitis a concern by imaging. 4. Stable appearance of the posterior pancreatic head lesion with diffuse pancreatic parenchymal atrophy and chronic dilatation of the main pancreatic duct. By report, the pancreatic head lesion represents metastatic colorectal cancer. 5. Stable upper normal lymph nodes  in the celiac axis. 6.  Aortic Atherosclerois (ICD10-170.0) 7. Stable anterior right upper lobe pulmonary nodule. Electronically Signed   By: Misty Stanley M.D.   On: 09/22/2018 17:30   Mr 3d Recon At Scanner  Result Date: 09/10/2018 CLINICAL DATA:  Right upper quadrant abdominal pain. 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 EXAM: MRI ABDOMEN WITHOUT AND WITH CONTRAST (INCLUDING MRCP) TECHNIQUE: Multiplanar multisequence MR imaging of the abdomen was performed both before and after the administration of intravenous contrast. Heavily T2-weighted images of the biliary and pancreatic ducts were obtained, and three-dimensional MRCP images were  rendered by post processing. CONTRAST:  9 mL Gadovist IV COMPARISON:  Right upper quadrant ultrasound dated 09/10/2018 FINDINGS: Lower chest: Lungs are essentially clear in this patient with known pulmonary nodules on prior CT. Hepatobiliary: Liver is within normal limits. No suspicious/enhancing hepatic lesions. Mildly distended gallbladder.  No layering gallstones. Mild intrahepatic ductal dilatation in the left hepatic lobe (series 5006/image 19). Common duct measures 8 mm, mildly prominent. No choledocholithiasis is seen. Pancreas: Chronic dilatation of the main pancreatic duct, measuring up to 11 mm, with associated atrophy of the pancreatic body/tail. Underlying 1.3 x 2.1 cm obstructing mass in the pancreatic head, previously 1.7 cm on CT. Spleen:  Within normal limits. Adrenals/Urinary Tract:  Adrenal glands are within normal limits. Kidneys are notable for bilateral renal sinus cysts. No hydronephrosis. Stomach/Bowel: Stomach is within normal limits. Visualized bowel is unremarkable. Vascular/Lymphatic:  No evidence of abdominal aortic aneurysm. No suspicious abdominal lymphadenopathy. Other:  No abdominal ascites. Musculoskeletal: No focal osseous lesions. IMPRESSION: 1.3 x 2.1 cm mass in the pancreatic head, mildly increased. Reportedly, this reflects a pancreatic metastasis, although primary pancreatic neoplasm could have a similar appearance. Associated chronic dilatation of the main pancreatic duct with atrophy of the pancreatic body/tail. Secondary mild intrahepatic and extrahepatic ductal dilatation. Common duct measures 8 mm. No choledocholithiasis is seen. Electronically Signed   By: Julian Hy M.D.   On: 09/10/2018 18:07   Dg Ercp  Result Date: 09/16/2018 CLINICAL DATA:  Obstructive jaundice. ERCP with balloon dilatation and biliary stent placement. EXAM: ERCP TECHNIQUE: Multiple spot images obtained with the fluoroscopic device and submitted for interpretation post-procedure.  COMPARISON:  MRCP-09/10/2018; CT abdomen pelvis-07/30/2018 FLUOROSCOPY TIME:  2 minutes, 48 seconds FINDINGS: Six spot fluoroscopic images of the right upper abdominal quadrant during ERCP are provided for review Initial image demonstrates an ERCP probe overlying the right upper abdominal quadrant. Incidentally noted IVC filter. There is selective cannulation and opacification of the common bile duct which appears to demonstrate malignant narrowing/occlusion of its distal aspect. Subsequent images demonstrate balloon dilatation of the distal aspect of the CBD. Completion image demonstrates placement of an internal biliary stent overlying the expected location of the mid and distal aspect of the CBD. IMPRESSION: ERCP with biliary dilatation and stent placement as detailed above. These images were submitted for radiologic interpretation only. Please see the procedural report for the amount of contrast and the fluoroscopy time utilized. Electronically Signed   By: Sandi Mariscal M.D.   On: 09/16/2018 14:38   Mr Abdomen Mrcp Moise Boring Contast  Result Date: 09/10/2018 CLINICAL DATA:  Right upper quadrant abdominal pain. 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 EXAM: MRI ABDOMEN WITHOUT AND WITH CONTRAST (INCLUDING MRCP) TECHNIQUE: Multiplanar multisequence MR imaging of the abdomen was performed both before and after the administration of intravenous contrast. Heavily T2-weighted images of the biliary and pancreatic ducts were obtained, and three-dimensional MRCP  images were rendered by post processing. CONTRAST:  9 mL Gadovist IV COMPARISON:  Right upper quadrant ultrasound dated 09/10/2018 FINDINGS: Lower chest: Lungs are essentially clear in this patient with known pulmonary nodules on prior CT. Hepatobiliary: Liver is within normal limits. No suspicious/enhancing hepatic lesions. Mildly distended gallbladder.  No layering gallstones. Mild intrahepatic ductal dilatation in the left hepatic lobe (series  5006/image 19). Common duct measures 8 mm, mildly prominent. No choledocholithiasis is seen. Pancreas: Chronic dilatation of the main pancreatic duct, measuring up to 11 mm, with associated atrophy of the pancreatic body/tail. Underlying 1.3 x 2.1 cm obstructing mass in the pancreatic head, previously 1.7 cm on CT. Spleen:  Within normal limits. Adrenals/Urinary Tract:  Adrenal glands are within normal limits. Kidneys are notable for bilateral renal sinus cysts. No hydronephrosis. Stomach/Bowel: Stomach is within normal limits. Visualized bowel is unremarkable. Vascular/Lymphatic:  No evidence of abdominal aortic aneurysm. No suspicious abdominal lymphadenopathy. Other:  No abdominal ascites. Musculoskeletal: No focal osseous lesions. IMPRESSION: 1.3 x 2.1 cm mass in the pancreatic head, mildly increased. Reportedly, this reflects a pancreatic metastasis, although primary pancreatic neoplasm could have a similar appearance. Associated chronic dilatation of the main pancreatic duct with atrophy of the pancreatic body/tail. Secondary mild intrahepatic and extrahepatic ductal dilatation. Common duct measures 8 mm. No choledocholithiasis is seen. Electronically Signed   By: Julian Hy M.D.   On: 09/10/2018 18:07   US Abdomen Limited Ruq  Result Date: 09/10/2018 CLINICAL DATA:  Right upper quadrant abdominal pain for 1 year. History rectal cancer. EXAM: ULTRASOUND ABDOMEN LIMITED RIGHT UPPER QUADRANT COMPARISON:  07/30/2018 CT abdomen/pelvis. FINDINGS: Gallbladder: Small amount of layering sludge in mildly distended gallbladder. Mild diffuse gallbladder wall thickening (4 mm gallbladder wall thickness). No cholelithiasis. No sonographic Murphy sign. No pericholecystic fluid. Common bile duct: Diameter: 7 mm Liver: Liver parenchyma is diffusely mildly echogenic. No liver mass detected, noting decreased sensitivity in the setting of an echogenic liver. Portal vein is patent on color Doppler imaging with normal  direction of blood flow towards the liver. IMPRESSION: 1. Mildly distended gallbladder. No cholelithiasis. Layering gallbladder sludge. No pericholecystic fluid. No sonographic Murphy's sign. Findings are nonspecific, most likely chronic. If there is clinical concern for acute acalculous cholecystitis, hepatobiliary scintigraphy study could be obtained for further evaluation. 2. Mildly dilated common bile duct (7 mm diameter), slightly increased from 07/30/2018 CT abdomen study. A cause of biliary obstruction is not identified on this ultrasound study. Recommend correlation with serum bilirubin levels. MRI abdomen with MRCP without and with IV contrast could be obtained for further evaluation as clinically warranted. 3. Nonspecific echogenic liver parenchyma, which could be due to hepatic steatosis and/or fibrosis. No liver masses, noting decreased sensitivity in the setting of an echogenic liver. Electronically Signed   By: Ilona Sorrel M.D.   On: 09/10/2018 12:41  [4 week]   Impression: Pleasant 68 year old gentleman with history of metastatic colon cancer with metastasis to the pancreatic head creating biliary obstruction requiring recent covered metal stent placement to the common bile duct on May 14.  Prior to stenting he was noted to have mildly dilated gallbladder with diffuse gallbladder wall thickening which again is noted on current imaging (CT) yesterday.  LFTs overall improved post biliary stenting.  Patient febrile overnight.  Source is unclear at this time.  Patient has chronic right upper quadrant/right lower chest pain for 6 months, worse the last 3 months.  On exam he has tenderness with palpation over the right lower rib cage.  He denies a postprandial component, more discomfort with movement.  Not clearly biliary in nature.  CT demonstrated circumferential wall thickening in the rectum adjacent to the anastomosis.  Last colonoscopy July 2015 with somewhat fibrotic/friable anastomotic mucosa  with benign biopsies.  Acute on chronic diarrhea with nocturnal diarrhea  Plan: 1. Await pending stool studies. 2. Consider outpatient colonoscopy to evaluate CT findings. 3. Follow-up blood cultures.  We would like to thank you for the opportunity to participate in the care of [name].  Laureen Ochs. Bernarda Caffey [facility] [phone] 5/21/20209:52 AM     LOS: 0 days

## 2018-09-23 NOTE — Progress Notes (Signed)
PROGRESS NOTE    Charles [name]  YYT:[id number] DOB: 09-May-1950 DOA: 09/22/2018 PCP: [name], MD   Brief Narrative:  Per HPI: Charles [name] is a 68 y.o. male with medical history significant of history of anxiety, depression, type 2 diabetes, GERD, history of high output ileostomy, history of urolithiasis, hypertension, history of colorectal cancer, history of proctectomy, radiation therapy and chemotherapy (last cycle about 6 weeks ago), history of multiple episodes of pneumonia, DVTs, history of pulmonary embolism on Xarelto who is coming to the emergency department due to persistent diarrhea for the past 4 days.  He states that for the past 4 days he has been having of 8-12 episodes of loose stools daily associated with mild RUQ pain.  The patient states for the past few days every time he tries to eat something area usually resolves in a loose stool bowel movement.  He had an episode of emesis earlier today.  He denies melena, hematochezia, flank pain, dysuria or frequency, but states that his urine is darker and he has been urinating less often.  He has not been febrile until he arrived to the telemetry floor.  He states he has an occipital headache, has not been able to eat or sleep properly as well.  He has positional dizziness and complaints of occasional pleuritic chest pain, which he thought may be due to his IVC filter placement.  However, the patient has fibrosis on the lungs and the Port-A-Cath catheter.  He denies rhinorrhea, sore throat, wheezing, hemoptysis, palpitations, diaphoresis, PND, orthopnea, but state he occasionally gets lower extremity edema.  No polyuria, polydipsia, polyphagia or blurred vision.  Patient was admitted with diarrhea in the setting of immunocompromise with ongoing chemotherapy.  He was also noted to have a dilated gallbladder.  He is being evaluated for viral causes of diarrhea and C. difficile has returned negative.  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 Clear liquid diet advanced to regular today Continue IV fluids now with LR. Antiemetics as needed. Imodium added as needed. Analgesics as needed. C. difficile negative.  GI pathogen panel pending  Active Problems:     Dilated gallbladder-incidental finding Continue IV fluids. Antiemetics as needed. Analgesics as needed. Defer antibiotics unless febrile or positive for GI pathogen. Appreciate GI recommendations. No signs of acute cholecystitis noted, no LFT elevation noted    Type 2 diabetes mellitus ([place]) Blood glucose stable Continue IV fluids. Continue Amaryl 2 mg p.o. daily. CBG monitoring with sensitive sliding scale.    Colon cancer [facility]) Follow-up with oncology as scheduled with Dr. [name]    Hypokalemia-repleted Follow-up in a.m.    Hypomagnesemia Replacing. Follow-up level in a.m.    GERD Continue PPI.    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 twice daily.    DVT (deep venous thrombosis) (HCC)   History of pulmonary embolism Continue daily Xarelto.   DVT prophylaxis: Xarelto Code Status: Full Family Communication: None at bedside Disposition Plan: Advance diet and continue IV fluid hydration.  Awaiting viral panel as well as blood cultures.  Appreciate GI recommendations.   Consultants:  GI  Procedures:   None  Antimicrobials:   None   Subjective: Patient seen and evaluated today with no new acute complaints or concerns. No acute concerns or events noted overnight.  He did have one loose  bowel movement noted this morning, but notes no further nausea or vomiting and would like to advance his diet.  He denies abdominal pain.  Objective: Vitals:   09/22/18 2100 09/22/18 2125 09/22/18 2235 09/23/18 0544  BP: (!) 153/78 (!) 162/83  (!) 151/82  Pulse: 94 98  98  Resp: (!) 22 20  20   Temp:  (!) 101 F (38.3 C) 99.4 F (37.4 C) 99.5 F (37.5 C)  TempSrc:  Oral Oral Oral  SpO2: 98% 97%  96%  Weight:      Height:        Intake/Output Summary (Last 24 hours) at 09/23/2018 1056 Last data filed at 09/23/2018 0800 Gross per 24 hour  Intake 2208.68 ml  Output 750 ml  Net 1458.68 ml   Filed Weights   09/22/18 1509 09/22/18 1658  Weight: 39.8 kg 87.5 kg    Examination:  General exam: Appears calm and comfortable  Respiratory system: Clear to auscultation. Respiratory effort normal. Cardiovascular system: S1 & S2 heard, RRR. No JVD, murmurs, rubs, gallops or clicks. No pedal edema. Gastrointestinal system: Abdomen is nondistended, soft and nontender. No organomegaly or masses felt. Normal bowel sounds heard. Central nervous system: Alert and oriented. No focal neurological deficits. Extremities: Symmetric 5 x 5 power. Skin: No rashes, lesions or ulcers Psychiatry: Judgement and insight appear normal. Mood & affect appropriate.     Data Reviewed: I have personally reviewed following labs and imaging studies  CBC: Recent Labs  Lab 09/22/18 1529 09/23/18 0552  WBC 9.0 10.7*  NEUTROABS 7.3  --   HGB 13.6 12.6*  HCT 40.6 38.4*  MCV 99.3 101.1*  PLT 236 597   Basic Metabolic Panel: Recent Labs  Lab 09/22/18 1529 09/23/18 0552  NA 138 139  K 3.4* 3.8  CL 106 107  CO2 20* 22  GLUCOSE 203* 159*  BUN 9 7*  CREATININE 0.66 0.62  CALCIUM 8.7* 8.1*  MG 1.5*  --   PHOS 3.4  --    GFR: Estimated Creatinine Clearance: 102.8 mL/min (by C-G formula based on SCr of 0.62 mg/dL). Liver Function Tests: Recent Labs  Lab 09/20/18 1218 09/22/18 1529 09/23/18 0552    AST 32 29 22  ALT 78* 50* 38  ALKPHOS 271* 237* 191*  BILITOT 2.3* 2.3* 2.7*  PROT 6.5 6.2* 5.5*  ALBUMIN 3.4* 3.4* 2.8*   Recent Labs  Lab 09/22/18 1529  LIPASE 17   No results for input(s): AMMONIA in the last 168 hours. Coagulation Profile: No results for input(s): INR, PROTIME in the last 168 hours. Cardiac Enzymes: No results for input(s): CKTOTAL, CKMB, CKMBINDEX, TROPONINI in the last 168 hours. BNP (last 3 results) No results for input(s): PROBNP in the last 8760 hours. HbA1C: No results for input(s): HGBA1C in the last 72 hours. CBG: Recent Labs  Lab 09/16/18 1158 09/16/18 1437 09/22/18 2128 09/23/18 0736  GLUCAP 119* 154* 134* 132*   Lipid Profile: No results for input(s): CHOL, HDL, LDLCALC, TRIG, CHOLHDL, LDLDIRECT in the last 72 hours. Thyroid Function Tests: No results for input(s): TSH, T4TOTAL, FREET4, T3FREE, THYROIDAB in the last 72 hours. Anemia Panel: No results for input(s): VITAMINB12, FOLATE, FERRITIN, TIBC, IRON, RETICCTPCT in the last 72 hours. Sepsis Labs: Recent Labs  Lab 09/22/18 2339  LATICACIDVEN 1.4    Recent Results (from the past 240 hour(s))  C difficile quick scan w PCR reflex     Status: None   Collection Time: 09/22/18  4:05 PM  Result Value Ref Range Status   C Diff antigen NEGATIVE NEGATIVE Final   C Diff toxin NEGATIVE NEGATIVE Final   C Diff interpretation No C. difficile detected.  Final    Comment: VALID Performed at [facility], [address], [place], [place] [address]   SARS Coronavirus 2 (CEPHEID - Performed in [place] hospital lab), Hosp Order     Status: None   Collection Time: 09/22/18  7:46 PM  Result Value Ref Range Status   SARS Coronavirus 2 NEGATIVE NEGATIVE Final    Comment: (NOTE) If result is NEGATIVE SARS-CoV-2 target nucleic acids are NOT DETECTED. The SARS-CoV-2 RNA is generally detectable in upper and lower  respiratory specimens during the acute phase of infection. The lowest   concentration of SARS-CoV-2 viral copies this assay can detect is 250  copies / mL. A negative result does not preclude SARS-CoV-2 infection  and should not be used as the sole basis for treatment or other  patient management decisions.  A negative result may occur with  improper specimen collection / handling, submission of specimen other  than nasopharyngeal swab, presence of viral mutation(s) within the  areas targeted by this assay, and inadequate number of viral copies  (<250 copies / mL). A negative result must be combined with clinical  observations, patient history, and epidemiological information. If result is POSITIVE SARS-CoV-2 target nucleic acids are DETECTED. The SARS-CoV-2 RNA is generally detectable in upper and lower  respiratory specimens dur ing the acute phase of infection.  Positive  results are indicative of active infection with SARS-CoV-2.  Clinical  correlation with patient history and other diagnostic information is  necessary to determine patient infection status.  Positive results do  not rule out bacterial infection or co-infection with other viruses. If result is PRESUMPTIVE POSTIVE SARS-CoV-2 nucleic acids MAY BE PRESENT.   A presumptive positive result was obtained on the submitted specimen  and confirmed on repeat testing.  While 2019 novel coronavirus  (SARS-CoV-2) nucleic acids may be present in the submitted sample  additional confirmatory testing may be necessary for epidemiological  and / or clinical management purposes  to differentiate between  SARS-CoV-2 and other Sarbecovirus currently known to infect humans.  If clinically indicated additional testing with an alternate test  methodology [phone]) is advised. The SARS-CoV-2 RNA is generally  detectable in upper and lower respiratory sp ecimens during the acute  phase of infection. The expected result is Negative. Fact Sheet for Patients:  StrictlyIdeas.no Fact Sheet  for Healthcare Providers: BankingDealers.co.za This test is not yet approved or cleared by the [place] FDA and has been authorized for detection and/or diagnosis of SARS-CoV-2 by FDA under an Emergency Use Authorization (EUA).  This EUA will remain in effect (meaning this test can be used) for the duration of the COVID-19 declaration under Section 564(b)(1) of the Act, 21 U.S.C. section 360bbb-3(b)(1), unless the authorization is terminated or revoked sooner. Performed at [facility], [address] [place], [place] [address]   Culture, blood (routine x 2)     Status: None (Preliminary result)   Collection Time: 09/22/18 11:32 PM  Result Value Ref Range Status   Specimen Description BLOOD RIGHT ANTECUBITAL  Final   Special Requests   Final    BOTTLES DRAWN AEROBIC AND  ANAEROBIC Blood Culture adequate volume   Culture   Final    NO GROWTH < 12 HOURS Performed at [facility], [address] [place], [place] [address]    Report Status PENDING  Incomplete  Culture, blood (routine x 2)     Status: None (Preliminary result)   Collection Time: 09/22/18 11:39 PM  Result Value Ref Range Status   Specimen Description BLOOD RIGHT HAND  Final   Special Requests   Final    BOTTLES DRAWN AEROBIC AND ANAEROBIC Blood Culture adequate volume   Culture   Final    NO GROWTH < 12 HOURS Performed at [facility], [address], [place], [place] [address]    Report Status PENDING  Incomplete         Radiology Studies: Dg Chest 2 View  Result Date: 09/22/2018 CLINICAL DATA:  Diarrhea central chest pain EXAM: CHEST - 2 VIEW COMPARISON:  03/22/2018, CT chest 07/30/2018 FINDINGS: Right-sided central venous port tip over the SVC. Chronic elevation of right diaphragm with scarring at the right middle lobe. CT demonstrated right upper lobe lung nodules not well seen. Stable cardiomediastinal silhouette. No pneumothorax. IMPRESSION: No active cardiopulmonary disease. Stable  right middle lobe scarring. Electronically Signed   By: Donavan Foil M.D.   On: 09/22/2018 16:59   Ct Abdomen Pelvis W Contrast  Result Date: 09/22/2018 CLINICAL DATA:  Stage IV rectal cancer with pancreatic metastases. Status post common bile duct stent placement. EXAM: CT ABDOMEN AND PELVIS WITH CONTRAST TECHNIQUE: Multidetector CT imaging of the abdomen and pelvis was performed using the standard protocol following bolus administration of intravenous contrast. CONTRAST:  161mL OMNIPAQUE IOHEXOL 300 MG/ML  SOLN COMPARISON:  MRI 09/10/2018.  CT scan 07/30/2018. FINDINGS: Lower chest: The heart size is normal. No substantial pericardial effusion. Coronary artery calcification is evident. Stable 7 mm anterior right lung nodule. Atelectasis noted lower lobes bilaterally. Hepatobiliary: No suspicious focal abnormality within the liver parenchyma. Gallbladder is distended with ill-defined gallbladder wall suggesting edema. Pneumobilia is compatible with the presence of the common bile duct stent. Pancreas: Diffuse pancreatic parenchymal atrophy with associated dilatation of the main pancreatic duct. Imaging features similar to prior with abrupt cut off at the level of the pancreatic head. 2.0 cm hypoenhancing lesion in the posterior pancreatic head is similar to prior MRI when it was measured at 2.2 cm. Spleen: No splenomegaly. No focal mass lesion. Adrenals/Urinary Tract: Stable adrenal glands bilaterally without mass lesion. Kidneys unremarkable. No evidence for hydroureter. The urinary bladder appears normal for the degree of distention. Stomach/Bowel: Stomach is distended with fluid and food material. Duodenum is normally positioned as is the ligament of Treitz. No small bowel wall thickening. No small bowel dilatation. No small bowel wall thickening. No small bowel dilatation. The terminal ileum is normal. Appendicoliths noted in the tip of the appendix without periappendiceal edema or inflammation.  Anastomotic staple line noted at the rectosigmoid junction with circumferential wall thickening in the rectum, progressed since 07/30/2018. Vascular/Lymphatic: There is abdominal aortic atherosclerosis without aneurysm. IVC filter identified in situ. Stranding in the hepato duodenal ligament may be related to edema. There is no gastrohepatic or hepatoduodenal ligament lymphadenopathy. No intraperitoneal or retroperitoneal lymphadenopathy. The 8 mm short axis celiac axis lymph node is stable and upper normal for size. Similar upper normal lymph nodes are seen in the portal caval space. No pelvic sidewall lymphadenopathy. Reproductive: The prostate gland and seminal vesicles are unremarkable. Other: No intraperitoneal free fluid. Musculoskeletal: Bilateral groin hernias contain only  fat. No worrisome lytic or sclerotic osseous abnormality. IMPRESSION: 1. Marked interval increase in circumferential wall thickening in the rectum, adjacent to the anastomosis. Recurrent disease a distinct concern. 2. Stomach is distended with fluid and food material. No associated small bowel dilatation. No obstructing lesion to suggest gastric outlet obstruction by CT. 3. Gallbladder is distended with apparent gallbladder wall edema. Cholecystitis a concern by imaging. 4. Stable appearance of the posterior pancreatic head lesion with diffuse pancreatic parenchymal atrophy and chronic dilatation of the main pancreatic duct. By report, the pancreatic head lesion represents metastatic colorectal cancer. 5. Stable upper normal lymph nodes in the celiac axis. 6.  Aortic Atherosclerois (ICD10-170.0) 7. Stable anterior right upper lobe pulmonary nodule. Electronically Signed   By: Misty Stanley M.D.   On: 09/22/2018 17:30        Scheduled Meds:  feeding supplement  1 Container Oral TID BM   gabapentin  900 mg Oral BID   glimepiride  2 mg Oral Q breakfast   insulin aspart  0-9 Units Subcutaneous TID WC   ondansetron  4 mg  Intravenous Once   pantoprazole  40 mg Oral Daily   rivaroxaban  20 mg Oral Q supper   traZODone  100 mg Oral QHS   Continuous Infusions:  lactated ringers 75 mL/hr at 09/23/18 1021     LOS: 0 days    Time spent: 30 minutes    Shaquel Josephson Darleen Crocker, DO [facility] Pager [phone]  If 7PM-7AM, please contact night-coverage [email or web address] Password [facility] 09/23/2018, 10:56 AM

## 2018-09-23 NOTE — Progress Notes (Signed)
Initial Nutrition Assessment  DOCUMENTATION CODES:  Not applicable  INTERVENTION:  Given diarrhea, continue Boost Breeze po TID, each supplement provides 250 kcal and 9 grams of protein  NUTRITION DIAGNOSIS:  Unintentional weight loss related to cancer progression vs diarrhea (Acute worsening) as evidenced by loss of 5-10 lbs x 2 weeks.  GOAL:  Patient will meet greater than or equal to 90% of their needs  MONITOR:  PO intake, Labs, I & O's, Supplement acceptance, Weight trends, Workup  REASON FOR ASSESSMENT:  Malnutrition Screening Tool    ASSESSMENT:  68 y/o male PMHx  HTN, GERD, DM, Depression, PVD and Stage IV rectal cancer with metastases to lungs &pancreas. Currently on maintenance chemotherapy. Early this month developed abd pain and elevated LFTs w/ MRI showing progression of pancreatic metastasis s/p ERCP w/ biliary sphincterectomy and stenting 5/14. Presents to ED w/ persistent diarrhea x4 days. Has also had episode of vomiting. Admitted for hydration and electrolyte replacement.   RD operating remotely d/t covid precautions. Attempted 2x to reach patient by phone but was unsuccessful. All info obtained from chart.   Pt known to RD from [facility]. He has struggled with chronic diarrhea for years. Recently he noted that it was worsened by PO intake and given his history of pancreatic metastases, he was briefly tried on creon, but this had no impact. He was started on lomotil and this had good effect. Per oncology MD, diarrhea felt related to his chemo regimen. Pt has developed acute worsening of diarrhea w/ fever and leukocytosis. He has stool studies pending.   Currently there is no documented meal intake. It does appear the patient is drinking the [employer]. Given his diarrhea, will continue this for now as it will likely be better tolerated than ensure.   Wt wise, up until February of this year, pts weight had been stable between 200-210 lbs for the past 2 years. He lost a  few lbs in early spring and this has lost another 5-10 lbs in just the last 2 weeks, likely related to cancer progression and AoC diarrhea.    Labs: Bgs 130-160, Albumin: 2.8, WBC: 10.7  Meds: Resource Breeze, Insulin, ppi, IVF  Recent Labs  Lab 09/22/18 1529 09/23/18 0552  NA 138 139  K 3.4* 3.8  CL 106 107  CO2 20* 22  BUN 9 7*  CREATININE 0.66 0.62  CALCIUM 8.7* 8.1*  MG 1.5*  --   PHOS 3.4  --   GLUCOSE 203* 159*   NUTRITION - FOCUSED PHYSICAL EXAM: Unable to conduct  Diet Order:   Diet Order            Diet heart healthy/carb modified Room service appropriate? Yes; Fluid consistency: Thin  Diet effective 1000             EDUCATION NEEDS:  Not appropriate for education at this time  Skin:  Skin Assessment: Reviewed RN Assessment  Last BM:  5/20  Height:  Ht Readings from Last 1 Encounters:  09/22/18 6\' 2"  (1.88 m)   Weight:  Wt Readings from Last 1 Encounters:  09/22/18 87.5 kg   Wt Readings from Last 10 Encounters:  09/22/18 87.5 kg  09/20/18 87.8 kg  09/16/18 89.8 kg  09/10/18 89.7 kg  08/19/18 89.9 kg  07/13/18 89.5 kg  06/22/18 92.1 kg  06/01/18 92.1 kg  05/11/18 95.3 kg  04/20/18 94.9 kg   Ideal Body Weight:  86.36 kg  BMI:  Body mass index is 24.78 kg/m.  Estimated  Nutritional Needs:  Kcal:  2100-2300 kcals (24-26 kcal/kg bw) Protein:  105-123g Pro (1.2-1.4g/kg bw) Fluid:  2.1-2.3 L fluid (22ml/kcal)  Burtis Junes RD, LDN, CNSC Clinical Nutrition Available Tues-Sat via Pager: [phone] 09/23/2018 12:09 PM

## 2018-09-24 ENCOUNTER — Encounter (HOSPITAL_COMMUNITY): Payer: Self-pay | Admitting: *Deleted

## 2018-09-24 ENCOUNTER — Encounter (HOSPITAL_COMMUNITY)
Admission: EM | Disposition: A | Discharge status: Home Health | Payer: Self-pay | Source: Home / Self Care | Attending: Internal Medicine

## 2018-09-24 DIAGNOSIS — R933 Abnormal findings on diagnostic imaging of other parts of digestive tract: Secondary | ICD-10-CM | POA: Diagnosis not present

## 2018-09-24 DIAGNOSIS — R197 Diarrhea, unspecified: Secondary | ICD-10-CM | POA: Diagnosis not present

## 2018-09-24 DIAGNOSIS — K6289 Other specified diseases of anus and rectum: Secondary | ICD-10-CM

## 2018-09-24 HISTORY — PX: BIOPSY: SHX5522

## 2018-09-24 HISTORY — PX: FLEXIBLE SIGMOIDOSCOPY: SHX5431

## 2018-09-24 LAB — CBC
HCT: 36 % — ABNORMAL LOW (ref 39.0–52.0)
Hemoglobin: 11.9 g/dL — ABNORMAL LOW (ref 13.0–17.0)
MCH: 33.1 pg (ref 26.0–34.0)
MCHC: 33.1 g/dL (ref 30.0–36.0)
MCV: 100.3 fL — ABNORMAL HIGH (ref 80.0–100.0)
Platelets: 194 10*3/uL (ref 150–400)
RBC: 3.59 MIL/uL — ABNORMAL LOW (ref 4.22–5.81)
RDW: 14.1 % (ref 11.5–15.5)
WBC: 13.8 10*3/uL — ABNORMAL HIGH (ref 4.0–10.5)
nRBC: 0 % (ref 0.0–0.2)

## 2018-09-24 LAB — GLUCOSE, CAPILLARY
Glucose-Capillary: 156 mg/dL — ABNORMAL HIGH (ref 70–99)
Glucose-Capillary: 98 mg/dL (ref 70–99)
Glucose-Capillary: 69 mg/dL — ABNORMAL LOW (ref 70–99)
Glucose-Capillary: 110 mg/dL — ABNORMAL HIGH (ref 70–99)
Glucose-Capillary: 85 mg/dL (ref 70–99)

## 2018-09-24 LAB — BASIC METABOLIC PANEL
Anion gap: 7 (ref 5–15)
BUN: 11 mg/dL (ref 8–23)
CO2: 24 mmol/L (ref 22–32)
Calcium: 8.1 mg/dL — ABNORMAL LOW (ref 8.9–10.3)
Chloride: 103 mmol/L (ref 98–111)
Creatinine, Ser: 0.68 mg/dL (ref 0.61–1.24)
GFR calc Af Amer: >60 mL/min (ref >60)
GFR calc non Af Amer: >60 mL/min (ref >60)
Glucose, Bld: 178 mg/dL — ABNORMAL HIGH (ref 70–99)
Potassium: 3.7 mmol/L (ref 3.5–5.1)
Sodium: 134 mmol/L — ABNORMAL LOW (ref 135–145)

## 2018-09-24 LAB — MAGNESIUM: Magnesium: 1.6 mg/dL — ABNORMAL LOW (ref 1.7–2.4)

## 2018-09-24 SURGERY — SIGMOIDOSCOPY, FLEXIBLE
Anesthesia: Moderate Sedation

## 2018-09-24 MED ORDER — MIDAZOLAM HCL 5 MG/5ML IJ SOLN
INTRAMUSCULAR | Status: AC
  Filled 2018-09-24: qty 5

## 2018-09-24 MED ORDER — MIDAZOLAM HCL 5 MG/5ML IJ SOLN
INTRAMUSCULAR | Status: DC | PRN
  Administered 2018-09-24 (×2): 2 mg via INTRAVENOUS

## 2018-09-24 MED ORDER — MEPERIDINE HCL 100 MG/ML IJ SOLN
INTRAMUSCULAR | Status: AC
  Filled 2018-09-24: qty 1

## 2018-09-24 MED ORDER — MAGNESIUM SULFATE 2 GM/50ML IV SOLN
2.0000 g | Freq: Once | INTRAVENOUS | Status: AC
  Administered 2018-09-24: 2 g via INTRAVENOUS
  Filled 2018-09-24: qty 50

## 2018-09-24 MED ORDER — DIPHENOXYLATE-ATROPINE 2.5-0.025 MG PO TABS: 1.0000 | ORAL_TABLET | Freq: Two times a day (BID) | ORAL | Status: DC

## 2018-09-24 MED ORDER — LACTATED RINGERS IV SOLN
INTRAVENOUS | Status: AC
  Administered 2018-09-24: 14:00:00 via INTRAVENOUS

## 2018-09-24 MED ORDER — MEPERIDINE HCL 100 MG/ML IJ SOLN
INTRAMUSCULAR | Status: DC | PRN
  Administered 2018-09-24: 25 mg

## 2018-09-24 MED ORDER — DIPHENOXYLATE-ATROPINE 2.5-0.025 MG PO TABS
1.0000 | ORAL_TABLET | Freq: Two times a day (BID) | ORAL | Status: DC
  Administered 2018-09-24 – 2018-10-01 (×13): 1 via ORAL
  Filled 2018-09-24 (×13): qty 1

## 2018-09-24 MED ORDER — SODIUM CHLORIDE 0.9 % IV SOLN
INTRAVENOUS | Status: DC
  Administered 2018-09-24: 09:00:00 via INTRAVENOUS

## 2018-09-24 NOTE — TOC Initial Note (Addendum)
Transition of Care [facility]) - Initial/Assessment Note    Patient Details  Name: Charles [name] MRN: [id number] Date of Birth: Jan 14, 1951  [facility]) CM/SW Contact:    Sherald Barge, RN Phone Number: 09/24/2018, 9:19 AM  Clinical Narrative:         TOC assessment for high readmission risk score d/t number of Rx ordered. Pt has rectal cancer with lung and pancreatic mets receiving chemo. Pt has strong family support. This is pt's only admission in past 6 months. Pt on [facility] registry but not active. MD plans to add Creon to medication regimen. Benefits check for co-pay has been requested.   Addendum: co-pay for creon will be approx $213 /90 day supply. Discussed with pt and he is able to afford that price.            Expected Discharge Plan: Home/Self Care      Expected Discharge Plan and Services Expected Discharge Plan: Home/Self Care       Living arrangements for the past 2 months: Single Family Home                         Prior Living Arrangements/Services Living arrangements for the past 2 months: Single Family Home Lives with:: Spouse Patient language and need for interpreter reviewed:: Yes        Need for Family Participation in Patient Care: Yes (Comment) Care giver support system in place?: Yes (comment)   Criminal Activity/Legal Involvement Pertinent to Current Situation/Hospitalization: No - Comment as needed  Activities of Daily Living Home Assistive Devices/Equipment: None ADL Screening (condition at time of admission) Patient's cognitive ability adequate to safely complete daily activities?: Yes Is the patient deaf or have difficulty hearing?: No Does the patient have difficulty seeing, even when wearing glasses/contacts?: No Does the patient have difficulty concentrating, remembering, or making decisions?: No Patient able to express need for assistance with ADLs?: Yes Does the patient have difficulty dressing or bathing?:  No Independently performs ADLs?: Yes (appropriate for developmental age) Does the patient have difficulty walking or climbing stairs?: No Weakness of Legs: None Weakness of Arms/Hands: None     Emotional Assessment       Orientation: : Oriented to Self, Oriented to  Time, Oriented to Place, Oriented to Situation      Admission diagnosis:  Epigastric abdominal pain [R10.13] Diarrhea, unspecified type [R19.7] Patient Active Problem List   Diagnosis Date Noted  . Abnormal CT scan, colon   . Diarrhea in adult patient 09/22/2018  . History of pulmonary embolism 09/22/2018  . Hypokalemia 09/22/2018  . Type 2 diabetes mellitus ([place]) 09/22/2018  . Hypomagnesemia 09/22/2018  . Dilated gallbladder 09/22/2018  . Goals of care, counseling/discussion 09/20/2018  . Fecal incontinence 09/23/2017  . Community acquired pneumonia of right lower lobe of lung ([place]) 07/23/2016  . Abnormal pancreas function test   . Rectal pain 05/30/2016  . Hiatal hernia   . Schatzki's ring   . GI bleed 11/23/2014  . Gastritis 11/23/2014  . S/P thoracotomy 11/23/2014  . S/P partial lobectomy of lung 11/15/2014  . DVT (deep venous thrombosis) ([place])   . Lung nodule   . Lung nodule, solitary   . Acute pulmonary embolism ([place]) 09/08/2014  . Lactic acidosis 09/08/2014  . Lower abdominal pain 09/08/2014  . Urinary retention 09/08/2014  . Diarrhea 08/01/2014  . 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 Stiffness of joint, not elsewhere classified, ankle and foot  01/31/2014  . Weakness of both legs 01/31/2014  . Hx of radiation therapy   . Rectal cancer ([place]) 01/15/2011  . Colon cancer ([place]) 07/24/2010  . GERD 07/11/2010   PCP:  [name], MD Pharmacy:   [facility] [address] [place] [address] Phone: [phone] Fax: [place], [place] [address] Phone:  [phone] Fax: [phone]  [place], [place] [address] [place] [address] Phone: [phone] Fax: [phone]  [place], [place] [address] Phone: [phone] Fax: [phone]

## 2018-09-24 NOTE — Interval H&P Note (Signed)
History and Physical Interval Note:  09/24/2018 9:31 AM  Charles [name]  has presented today for surgery, with the diagnosis of DIARRHEA, ABNORMAL CT OF RECTUM.  The various methods of treatment have been discussed with the patient and family. After consideration of risks, benefits and other options for treatment, the patient has consented to  Procedure(s): FLEXIBLE SIGMOIDOSCOPY (N/A) as a surgical intervention.  The patient's history has been reviewed, patient examined, no change in status, stable for surgery.  I have reviewed the patient's chart and labs.  Questions were answered to the patient's satisfaction.     [employer]

## 2018-09-24 NOTE — Progress Notes (Signed)
PROGRESS NOTE    Charles [name]  CZY:[id number] DOB: Apr 07, 1951 DOA: 09/22/2018 PCP: [name], MD   Brief Narrative:  Per HPI: Charles [name] a 68 y.o.malewith medical history significant ofhistory of anxiety, depression, type 2 diabetes, GERD, history of high output ileostomy, history of urolithiasis, hypertension, history of colorectal cancer, history of proctectomy, radiation therapy and chemotherapy (last cycle about 6 weeks ago), history of multiple episodes of pneumonia, DVTs, history of pulmonary embolism on Xarelto who is coming to the emergency department due to persistent diarrhea for the past 4 days.He states that for the past 4 days he has been having of 8-12 episodes of loose stools daily associated with mild RUQ pain. The patient states for the past few days every time he tries to eat something area usually resolves in a loose stool bowel movement. He had an episode of emesis earlier today. He denies melena, hematochezia, flank pain, dysuria or frequency, but states that his urine is darker and he has been urinating less often. He has not been febrile until he arrived to the telemetry floor. He states he has an occipital headache, has not been able to eat or sleep properly as well. He has positional dizziness and complaints of occasional pleuritic chest pain, which he thought may be due to his IVC filter placement. However, the patient has fibrosis on the lungs and the Port-A-Cath catheter. He denies rhinorrhea, sore throat, wheezing, hemoptysis, palpitations, diaphoresis, PND, orthopnea, but state he occasionally gets lower extremity edema. No polyuria, polydipsia, polyphagia or blurred vision.  Patient was admitted with diarrhea in the setting of immunocompromise with ongoing chemotherapy.  He was also noted to have a dilated gallbladder.  He has C. difficile study has returned negative and GI panel returned positive for enteropathogenic E. coli with  recommendations not to treat with antibiotics at this point.  He has undergone flex sigmoidoscopy on 5/22 with findings of stenotic anastomosis and possible need for diverting colostomy soon.  Assessment & Plan:   Principal Problem:   Diarrhea in adult patient Active Problems:   GERD   Peripheral neuropathy due to oxaliplatin-chemotherapy   DVT (deep venous thrombosis) (HCC)   Colon cancer (HCC)   History of pulmonary embolism   Hypokalemia   Type 2 diabetes mellitus (HCC)   Hypomagnesemia   Dilated gallbladder   Abnormal CT scan, colon  Diarrhea in immunocompromised patient with EPEC infection and stenotic anastomosis noted on flex sig 5/22 Recommendations for no antibiotics at this time Continue current diet with GI to discuss possible diverting colostomy in a.m. Continue on IV fluid hydration and follow labs  Active Problems: Dilated gallbladder-incidental finding Continue IV fluids. Antiemetics as needed. Analgesics as needed. No signs of acute cholecystitis noted, no LFT elevation noted  Type 2 diabetes mellitus ([place]) Blood glucose stable Continue IV fluids. Continue Amaryl 2 mg p.o. daily. CBG monitoring with sensitive sliding scale.  Colon cancer [facility]) Follow-up with oncology as scheduled with Dr. [name]  Hypokalemia-repleted Follow-up in a.m.  Hypomagnesemia Replacing. Follow-up level in a.m.  GERD Continue PPI.  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 twice daily.  DVT (deep venous thrombosis) (HCC) History of pulmonary embolism Continue daily Xarelto.   DVT prophylaxis: Xarelto Code Status: Full Family Communication: None at bedside Disposition Plan:  Per GI    Consultants:   GI  Procedures:   None  Antimicrobials:   None  Subjective: Patient seen and evaluated today with no new acute complaints or  concerns. No acute concerns or events noted  overnight.  He continues to have significant diarrhea with frequent liquidy stools noted overnight.  Objective: Vitals:   09/24/18 0955 09/24/18 1000 09/24/18 1032 09/24/18 1247  BP: (!) 135/57  128/77 132/72  Pulse: 99 98 96 92  Resp: (!) 28 (!) 26 18 18   Temp:   98.6 F (37 C) 98.4 F (36.9 C)  TempSrc:   Oral Oral  SpO2: 98% 98% 97% 96%  Weight:      Height:        Intake/Output Summary (Last 24 hours) at 09/24/2018 1313 Last data filed at 09/24/2018 1249 Gross per 24 hour  Intake 120 ml  Output 200 ml  Net -80 ml   Filed Weights   09/22/18 1509 09/22/18 1658  Weight: 39.8 kg 87.5 kg    Examination:  General exam: Appears calm and comfortable  Respiratory system: Clear to auscultation. Respiratory effort normal. Cardiovascular system: S1 & S2 heard, RRR. No JVD, murmurs, rubs, gallops or clicks. No pedal edema. Gastrointestinal system: Abdomen is nondistended, soft and nontender. No organomegaly or masses felt. Normal bowel sounds heard. Central nervous system: Alert and oriented. No focal neurological deficits. Extremities: Symmetric 5 x 5 power. Skin: No rashes, lesions or ulcers Psychiatry: Judgement and insight appear normal. Mood & affect appropriate.     Data Reviewed: I have personally reviewed following labs and imaging studies  CBC: Recent Labs  Lab 09/22/18 1529 09/23/18 0552 09/24/18 0500  WBC 9.0 10.7* 13.8*  NEUTROABS 7.3  --   --   HGB 13.6 12.6* 11.9*  HCT 40.6 38.4* 36.0*  MCV 99.3 101.1* 100.3*  PLT 236 225 702   Basic Metabolic Panel: Recent Labs  Lab 09/22/18 1529 09/23/18 0552 09/24/18 0500  NA 138 139 134*  K 3.4* 3.8 3.7  CL 106 107 103  CO2 20* 22 24  GLUCOSE 203* 159* 178*  BUN 9 7* 11  CREATININE 0.66 0.62 0.68  CALCIUM 8.7* 8.1* 8.1*  MG 1.5*  --  1.6*  PHOS 3.4  --   --    GFR: Estimated Creatinine Clearance: 102.8 mL/min (by C-G formula based on SCr of 0.68 mg/dL). Liver Function Tests: Recent Labs  Lab  09/20/18 1218 09/22/18 1529 09/23/18 0552  AST 32 29 22  ALT 78* 50* 38  ALKPHOS 271* 237* 191*  BILITOT 2.3* 2.3* 2.7*  PROT 6.5 6.2* 5.5*  ALBUMIN 3.4* 3.4* 2.8*   Recent Labs  Lab 09/22/18 1529  LIPASE 17   No results for input(s): AMMONIA in the last 168 hours. Coagulation Profile: No results for input(s): INR, PROTIME in the last 168 hours. Cardiac Enzymes: No results for input(s): CKTOTAL, CKMB, CKMBINDEX, TROPONINI in the last 168 hours. BNP (last 3 results) No results for input(s): PROBNP in the last 8760 hours. HbA1C: No results for input(s): HGBA1C in the last 72 hours. CBG: Recent Labs  Lab 09/23/18 1128 09/23/18 1623 09/23/18 2103 09/24/18 0744 09/24/18 1125  GLUCAP 128* 154* 131* 156* 98   Lipid Profile: No results for input(s): CHOL, HDL, LDLCALC, TRIG, CHOLHDL, LDLDIRECT in the last 72 hours. Thyroid Function Tests: No results for input(s): TSH, T4TOTAL, FREET4, T3FREE, THYROIDAB in the last 72 hours. Anemia Panel: No results for input(s): VITAMINB12, FOLATE, FERRITIN, TIBC, IRON, RETICCTPCT in the last 72 hours. Sepsis Labs: Recent Labs  Lab 09/22/18 2339  LATICACIDVEN 1.4    Recent Results (from the past 240 hour(s))  Gastrointestinal Panel by PCR , Stool  Status: Abnormal   Collection Time: 09/22/18  4:04 PM  Result Value Ref Range Status   Campylobacter species NOT DETECTED NOT DETECTED Final   Plesimonas shigelloides NOT DETECTED NOT DETECTED Final   Salmonella species NOT DETECTED NOT DETECTED Final   Yersinia enterocolitica NOT DETECTED NOT DETECTED Final   Vibrio species NOT DETECTED NOT DETECTED Final   Vibrio cholerae NOT DETECTED NOT DETECTED Final   Enteroaggregative E coli (EAEC) NOT DETECTED NOT DETECTED Final   Enteropathogenic E coli (EPEC) DETECTED (A) NOT DETECTED Final    Comment: CRITICAL RESULT CALLED TO, READ BACK BY AND VERIFIED WITH: CALLED TO DANA COX @1442  09/23/2018 SAC    Enterotoxigenic E coli (ETEC) NOT  DETECTED NOT DETECTED Final   Shiga like toxin producing E coli (STEC) NOT DETECTED NOT DETECTED Final   Shigella/Enteroinvasive E coli (EIEC) NOT DETECTED NOT DETECTED Final   Cryptosporidium NOT DETECTED NOT DETECTED Final   Cyclospora cayetanensis NOT DETECTED NOT DETECTED Final   Entamoeba histolytica NOT DETECTED NOT DETECTED Final   Giardia lamblia NOT DETECTED NOT DETECTED Final   Adenovirus F40/41 NOT DETECTED NOT DETECTED Final   Astrovirus NOT DETECTED NOT DETECTED Final   Norovirus GI/GII NOT DETECTED NOT DETECTED Final   Rotavirus A NOT DETECTED NOT DETECTED Final   Sapovirus (I, II, IV, and V) NOT DETECTED NOT DETECTED Final    Comment: Performed at [facility], [address] [place], [place] [address]  C difficile quick scan w PCR reflex     Status: None   Collection Time: 09/22/18  4:05 PM  Result Value Ref Range Status   C Diff antigen NEGATIVE NEGATIVE Final   C Diff toxin NEGATIVE NEGATIVE Final   C Diff interpretation No C. difficile detected.  Final    Comment: VALID Performed at [facility], [address], [place], [place] [address]   SARS Coronavirus 2 (CEPHEID - Performed in [facility] hospital lab), Hosp Order     Status: None   Collection Time: 09/22/18  7:46 PM  Result Value Ref Range Status   SARS Coronavirus 2 NEGATIVE NEGATIVE Final    Comment: (NOTE) If result is NEGATIVE SARS-CoV-2 target nucleic acids are NOT DETECTED. The SARS-CoV-2 RNA is generally detectable in upper and lower  respiratory specimens during the acute phase of infection. The lowest  concentration of SARS-CoV-2 viral copies this assay can detect is 250  copies / mL. A negative result does not preclude SARS-CoV-2 infection  and should not be used as the sole basis for treatment or other  patient management decisions.  A negative result may occur with  improper specimen collection / handling, submission of specimen other  than nasopharyngeal swab, presence of viral  mutation(s) within the  areas targeted by this assay, and inadequate number of viral copies  (<250 copies / mL). A negative result must be combined with clinical  observations, patient history, and epidemiological information. If result is POSITIVE SARS-CoV-2 target nucleic acids are DETECTED. The SARS-CoV-2 RNA is generally detectable in upper and lower  respiratory specimens dur ing the acute phase of infection.  Positive  results are indicative of active infection with SARS-CoV-2.  Clinical  correlation with patient history and other diagnostic information is  necessary to determine patient infection status.  Positive results do  not rule out bacterial infection or co-infection with other viruses. If result is PRESUMPTIVE POSTIVE SARS-CoV-2 nucleic acids MAY BE PRESENT.   A presumptive positive result was obtained on the submitted  specimen  and confirmed on repeat testing.  While 2019 novel coronavirus  (SARS-CoV-2) nucleic acids may be present in the submitted sample  additional confirmatory testing may be necessary for epidemiological  and / or clinical management purposes  to differentiate between  SARS-CoV-2 and other Sarbecovirus currently known to infect humans.  If clinically indicated additional testing with an alternate test  methodology [phone]) is advised. The SARS-CoV-2 RNA is generally  detectable in upper and lower respiratory sp ecimens during the acute  phase of infection. The expected result is Negative. Fact Sheet for Patients:  StrictlyIdeas.no Fact Sheet for Healthcare Providers: BankingDealers.co.za This test is not yet approved or cleared by the [place] FDA and has been authorized for detection and/or diagnosis of SARS-CoV-2 by FDA under an Emergency Use Authorization (EUA).  This EUA will remain in effect (meaning this test can be used) for the duration of the COVID-19 declaration under Section 564(b)(1)  of the Act, 21 U.S.C. section 360bbb-3(b)(1), unless the authorization is terminated or revoked sooner. Performed at [facility], [address] [place], [place] [address]   Culture, blood (routine x 2)     Status: None (Preliminary result)   Collection Time: 09/22/18 11:32 PM  Result Value Ref Range Status   Specimen Description BLOOD RIGHT ANTECUBITAL  Final   Special Requests   Final    BOTTLES DRAWN AEROBIC AND ANAEROBIC Blood Culture adequate volume   Culture   Final    NO GROWTH 2 DAYS Performed at [facility], [address] [place], [place] [address]    Report Status PENDING  Incomplete  Culture, blood (routine x 2)     Status: None (Preliminary result)   Collection Time: 09/22/18 11:39 PM  Result Value Ref Range Status   Specimen Description BLOOD RIGHT HAND  Final   Special Requests   Final    BOTTLES DRAWN AEROBIC AND ANAEROBIC Blood Culture adequate volume   Culture   Final    NO GROWTH 2 DAYS Performed at [facility], [address] [place], [place] [address]    Report Status PENDING  Incomplete         Radiology Studies: Dg Chest 2 View  Result Date: 09/22/2018 CLINICAL DATA:  Diarrhea central chest pain EXAM: CHEST - 2 VIEW COMPARISON:  03/22/2018, CT chest 07/30/2018 FINDINGS: Right-sided central venous port tip over the SVC. Chronic elevation of right diaphragm with scarring at the right middle lobe. CT demonstrated right upper lobe lung nodules not well seen. Stable cardiomediastinal silhouette. No pneumothorax. IMPRESSION: No active cardiopulmonary disease. Stable right middle lobe scarring. Electronically Signed   By: Donavan Foil M.D.   On: 09/22/2018 16:59   Ct Abdomen Pelvis W Contrast  Result Date: 09/22/2018 CLINICAL DATA:  Stage IV rectal cancer with pancreatic metastases. Status post common bile duct stent placement. EXAM: CT ABDOMEN AND PELVIS WITH CONTRAST TECHNIQUE: Multidetector CT imaging of the abdomen and pelvis was performed using the  standard protocol following bolus administration of intravenous contrast. CONTRAST:  175mL OMNIPAQUE IOHEXOL 300 MG/ML  SOLN COMPARISON:  MRI 09/10/2018.  CT scan 07/30/2018. FINDINGS: Lower chest: The heart size is normal. No substantial pericardial effusion. Coronary artery calcification is evident. Stable 7 mm anterior right lung nodule. Atelectasis noted lower lobes bilaterally. Hepatobiliary: No suspicious focal abnormality within the liver parenchyma. Gallbladder is distended with ill-defined gallbladder wall suggesting edema. Pneumobilia is compatible with the presence of the common bile duct stent. Pancreas: Diffuse pancreatic parenchymal atrophy with associated dilatation of  the main pancreatic duct. Imaging features similar to prior with abrupt cut off at the level of the pancreatic head. 2.0 cm hypoenhancing lesion in the posterior pancreatic head is similar to prior MRI when it was measured at 2.2 cm. Spleen: No splenomegaly. No focal mass lesion. Adrenals/Urinary Tract: Stable adrenal glands bilaterally without mass lesion. Kidneys unremarkable. No evidence for hydroureter. The urinary bladder appears normal for the degree of distention. Stomach/Bowel: Stomach is distended with fluid and food material. Duodenum is normally positioned as is the ligament of Treitz. No small bowel wall thickening. No small bowel dilatation. No small bowel wall thickening. No small bowel dilatation. The terminal ileum is normal. Appendicoliths noted in the tip of the appendix without periappendiceal edema or inflammation. Anastomotic staple line noted at the rectosigmoid junction with circumferential wall thickening in the rectum, progressed since 07/30/2018. Vascular/Lymphatic: There is abdominal aortic atherosclerosis without aneurysm. IVC filter identified in situ. Stranding in the hepato duodenal ligament may be related to edema. There is no gastrohepatic or hepatoduodenal ligament lymphadenopathy. No intraperitoneal or  retroperitoneal lymphadenopathy. The 8 mm short axis celiac axis lymph node is stable and upper normal for size. Similar upper normal lymph nodes are seen in the portal caval space. No pelvic sidewall lymphadenopathy. Reproductive: The prostate gland and seminal vesicles are unremarkable. Other: No intraperitoneal free fluid. Musculoskeletal: Bilateral groin hernias contain only fat. No worrisome lytic or sclerotic osseous abnormality. IMPRESSION: 1. Marked interval increase in circumferential wall thickening in the rectum, adjacent to the anastomosis. Recurrent disease a distinct concern. 2. Stomach is distended with fluid and food material. No associated small bowel dilatation. No obstructing lesion to suggest gastric outlet obstruction by CT. 3. Gallbladder is distended with apparent gallbladder wall edema. Cholecystitis a concern by imaging. 4. Stable appearance of the posterior pancreatic head lesion with diffuse pancreatic parenchymal atrophy and chronic dilatation of the main pancreatic duct. By report, the pancreatic head lesion represents metastatic colorectal cancer. 5. Stable upper normal lymph nodes in the celiac axis. 6.  Aortic Atherosclerois (ICD10-170.0) 7. Stable anterior right upper lobe pulmonary nodule. Electronically Signed   By: Misty Stanley M.D.   On: 09/22/2018 17:30        Scheduled Meds:  diphenoxylate-atropine  1 tablet Oral BID WC   feeding supplement  1 Container Oral TID BM   gabapentin  900 mg Oral BID   glimepiride  2 mg Oral Q breakfast   insulin aspart  0-9 Units Subcutaneous TID WC   lipase/protease/amylase  108,000 Units Oral TID WC   lipase/protease/amylase  72,000 Units Oral With snacks   meperidine       midazolam       ondansetron  4 mg Intravenous Once   pantoprazole  40 mg Oral Daily   rivaroxaban  20 mg Oral Q supper   traZODone  100 mg Oral QHS   Continuous Infusions:   LOS: 0 days    Time spent: 30 minutes    Charles Barnick Darleen Crocker,  DO [facility] Pager [phone]  If 7PM-7AM, please contact night-coverage [email or web address] Password [email or web address] 09/24/2018, 1:13 PM

## 2018-09-24 NOTE — Progress Notes (Signed)
Spoke w/ pt regarding creon order. Reminded him how he was tried on this before (ordered 2/18), but quit after 4-5 days d/t cramps per his report 3/10. He says he remembers the medication, but not the name. He says he is very bad about remembering the names of all the meds he takes. "I couldn't tell you tommorow the name of the medications you gave me today"  Spoke w/ GI MD. Given his failed past attempt with creon, she gave order to D/C.   However, when returned to notify pt. He expressed significant stress/worry over thought of ostomy. He wants to retry creon-' "ill try anything to not need a bag".  He says he still has 2 bottles of creon at home. He paid $2200 for them.   Left order in place for MD to discuss tomorrow.   Burtis Junes RD, LDN, CNSC Clinical Nutrition Available Tues-Sat via Pager: [phone] 09/24/2018 4:59 PM

## 2018-09-24 NOTE — Op Note (Signed)
[facility] Patient Name: Charles [name] Procedure Date: 09/24/2018 9:16 AM MRN: [id number] Date of Birth: Jul 28, 1950 Attending MD: Barney Drain MD, MD CSN: [id number] Age: 68 Admit Type: Inpatient Procedure:                Flexible Sigmoidoscopy WITH COLD FORCEPS BIOPSY Indications:              Diarrhea Providers:                Barney Drain MD, MD, Janeece Riggers, RN, Raphael Gibney, Technician, Randa Spike, Technician Referring MD:             Derek Jack, MD Medicines:                Meperidine 25 mg IV, Midazolam 4 mg IV Complications:            No immediate complications. Estimated Blood Loss:     Estimated blood loss was minimal. Procedure:                Pre-Anesthesia Assessment:                           - Prior to the procedure, a History and Physical                            was performed, and patient medications and                            allergies were reviewed. The patient's tolerance of                            previous anesthesia was also reviewed. The risks                            and benefits of the procedure and the sedation                            options and risks were discussed with the patient.                            All questions were answered, and informed consent                            was obtained. Prior Anticoagulants: The patient has                            taken Xarelto (rivaroxaban), last dose was day of                            procedure. ASA Grade Assessment: II - A patient                            with mild systemic disease. After reviewing the  risks and benefits, the patient was deemed in                            satisfactory condition to undergo the procedure.                            After obtaining informed consent, the scope was                            passed under direct vision. The [id number]-[id number] ([id number])                            scope was  introduced through the anus and advanced                            to the the sigmoid colon. The flexible                            sigmoidoscopy was accomplished without difficulty.                            The patient tolerated the procedure well. The                            quality of the bowel preparation was adequate. Scope In: 9:46:55 AM Scope Out: 9:53:23 AM Total Procedure Duration: 0 hours 6 minutes 28 seconds  Findings:      A localized area of mildly nodular mucosa was found in the distal       rectum. Biopsies were taken with a cold forceps for histology.      -THE COLORECTAL ANASTOMOSIS IS NARROWED TO APPROXIMATELY 11-12 CM AND       FRIABLE. SMALL AMOUNT OF FORMED STOOL IN RECTAL VAULT AND LARGE AMOUNT       PROXIMAL TO ANASTOMOSIS. Impression:               - WATERY STOOL/FECAL INCONTINENCE DUE TO STENOTIC                            ANASTOMOSIS ONLY ALLOWING LIQUID STOOL TO PASS INTO                            THE RECTUM.                           - NO OBVIOUS SOURCE FOR CHANGES ON CT IDENTIFIED Moderate Sedation:      Moderate (conscious) sedation was administered by the endoscopy nurse       and supervised by the endoscopist. The following parameters were       monitored: oxygen saturation, heart rate, blood pressure, and response       to care. Total physician intraservice time was 19 minutes. Recommendation:           - Return patient to hospital ward for ongoing care.                           -  Resume previous diet.                           - IF BIOPSIES ARE NEGATIVE FOR MALIGNANCY, CONSIDER                            RECTAL [place] TO ASSESS FOR RECURRENT DISEASE IN THE                            SUBMUCOSA OF THE RECTUM.                           - DOUBT PT CAN BE MEDICALLY MANAGED AND WOULD                            CONSIDER DIVERTING COLOSTOMY. CALLED TO DISCUSS                            WITH DR. [email or web address]([id number]). Procedure Code(s):        --- Professional  ---                           [phone], Sigmoidoscopy, flexible; with biopsy, single                            or multiple                           G0500, Moderate sedation services provided by the                            same physician or other qualified health care                            professional performing a gastrointestinal                            endoscopic service that sedation supports,                            requiring the presence of an independent trained                            observer to assist in the monitoring of the                            patient's level of consciousness and physiological                            status; initial 15 minutes of intra-service time;                            patient age 73 years or older (additional time 21  be reported with [phone], as appropriate) Diagnosis Code(s):        --- Professional ---                           K62.89, Other specified diseases of anus and rectum                           R19.7, Diarrhea, unspecified CPT copyright 2019 American Medical Association. All rights reserved. The codes documented in this report are preliminary and upon coder review may  be revised to meet current compliance requirements. Barney Drain, MD Barney Drain MD, MD 09/24/2018 03:54:65 AM This report has been signed electronically. Number of Addenda: 0

## 2018-09-24 NOTE — TOC Benefit Eligibility Note (Signed)
Transition of Care [facility]) Benefit Eligibility Note    Patient Details  Name: Charles [name] MRN: [id number] Date of Birth: [birth year]   Medication/Dose: creon 114,000 units tid 90 day supply  Covered?: Yes  Tier: 3 Drug  Prescription Coverage Preferred Pharmacy: retail   Spoke with Person/Company/Phone Number:: Mariane Masters with [employer] at [phone]  Co-Pay: $213  Prior Approval: No  Deductible: Met  Additional Notes: patient will have to pay $213( which is 25%) for brand name medication due to coverage gap    Tommy Medal Phone Number: 09/24/2018, 10:37 AM

## 2018-09-24 NOTE — TOC Benefit Eligibility Note (Signed)
Transition of Care [facility]) Benefit Eligibility Note    Patient Details  Name: ATWELL MCDANEL MRN: [id number] Date of Birth: [birth year]   Medication/Dose: creon 180,000 units tid  Covered?: Yes  Tier: 3 Drug  Prescription Coverage Preferred Pharmacy: can use retail pharmacy or [facility]  Spoke with Person/Company/Phone Number:: [place]- [employer] at [phone]     Prior Approval: No          Tommy Medal Phone Number: 09/24/2018, 10:13 AM

## 2018-09-24 NOTE — Progress Notes (Signed)
DISCUSSED CASE WITH DR. VANDAM. PT POS FOR EPEC. AGREES WITH  HOLDING ABX AT THIS TIME

## 2018-09-25 ENCOUNTER — Encounter (HOSPITAL_COMMUNITY): Payer: Self-pay | Admitting: Radiology

## 2018-09-25 ENCOUNTER — Inpatient Hospital Stay (HOSPITAL_COMMUNITY): Payer: Medicare Other

## 2018-09-25 DIAGNOSIS — Z7901 Long term (current) use of anticoagulants: Secondary | ICD-10-CM | POA: Diagnosis not present

## 2018-09-25 DIAGNOSIS — I700 Atherosclerosis of aorta: Secondary | ICD-10-CM | POA: Diagnosis present

## 2018-09-25 DIAGNOSIS — K624 Stenosis of anus and rectum: Secondary | ICD-10-CM | POA: Diagnosis not present

## 2018-09-25 DIAGNOSIS — K219 Gastro-esophageal reflux disease without esophagitis: Secondary | ICD-10-CM | POA: Diagnosis not present

## 2018-09-25 DIAGNOSIS — R197 Diarrhea, unspecified: Secondary | ICD-10-CM | POA: Diagnosis not present

## 2018-09-25 DIAGNOSIS — I10 Essential (primary) hypertension: Secondary | ICD-10-CM | POA: Diagnosis present

## 2018-09-25 DIAGNOSIS — C250 Malignant neoplasm of head of pancreas: Secondary | ICD-10-CM | POA: Diagnosis not present

## 2018-09-25 DIAGNOSIS — I2510 Atherosclerotic heart disease of native coronary artery without angina pectoris: Secondary | ICD-10-CM | POA: Diagnosis present

## 2018-09-25 DIAGNOSIS — C19 Malignant neoplasm of rectosigmoid junction: Secondary | ICD-10-CM | POA: Diagnosis not present

## 2018-09-25 DIAGNOSIS — R1013 Epigastric pain: Secondary | ICD-10-CM | POA: Diagnosis not present

## 2018-09-25 DIAGNOSIS — A040 Enteropathogenic Escherichia coli infection: Secondary | ICD-10-CM | POA: Diagnosis not present

## 2018-09-25 DIAGNOSIS — T451X5A Adverse effect of antineoplastic and immunosuppressive drugs, initial encounter: Secondary | ICD-10-CM | POA: Diagnosis not present

## 2018-09-25 DIAGNOSIS — Z79899 Other long term (current) drug therapy: Secondary | ICD-10-CM | POA: Diagnosis not present

## 2018-09-25 DIAGNOSIS — R159 Full incontinence of feces: Secondary | ICD-10-CM

## 2018-09-25 DIAGNOSIS — Z85048 Personal history of other malignant neoplasm of rectum, rectosigmoid junction, and anus: Secondary | ICD-10-CM | POA: Diagnosis not present

## 2018-09-25 DIAGNOSIS — K828 Other specified diseases of gallbladder: Secondary | ICD-10-CM | POA: Diagnosis not present

## 2018-09-25 DIAGNOSIS — C7889 Secondary malignant neoplasm of other digestive organs: Secondary | ICD-10-CM | POA: Diagnosis not present

## 2018-09-25 DIAGNOSIS — Z1159 Encounter for screening for other viral diseases: Secondary | ICD-10-CM | POA: Diagnosis not present

## 2018-09-25 DIAGNOSIS — Z886 Allergy status to analgesic agent status: Secondary | ICD-10-CM | POA: Diagnosis not present

## 2018-09-25 DIAGNOSIS — G620 Drug-induced polyneuropathy: Secondary | ICD-10-CM | POA: Diagnosis not present

## 2018-09-25 DIAGNOSIS — E1151 Type 2 diabetes mellitus with diabetic peripheral angiopathy without gangrene: Secondary | ICD-10-CM | POA: Diagnosis not present

## 2018-09-25 DIAGNOSIS — E860 Dehydration: Secondary | ICD-10-CM | POA: Diagnosis present

## 2018-09-25 DIAGNOSIS — K91858 Other complications of intestinal pouch: Secondary | ICD-10-CM | POA: Diagnosis not present

## 2018-09-25 DIAGNOSIS — K913 Postprocedural intestinal obstruction, unspecified as to partial versus complete: Secondary | ICD-10-CM | POA: Diagnosis not present

## 2018-09-25 DIAGNOSIS — E876 Hypokalemia: Secondary | ICD-10-CM | POA: Diagnosis present

## 2018-09-25 DIAGNOSIS — Z86711 Personal history of pulmonary embolism: Secondary | ICD-10-CM | POA: Diagnosis not present

## 2018-09-25 DIAGNOSIS — Z923 Personal history of irradiation: Secondary | ICD-10-CM | POA: Diagnosis not present

## 2018-09-25 LAB — CBC
HCT: 35.4 % — ABNORMAL LOW (ref 39.0–52.0)
Hemoglobin: 11.7 g/dL — ABNORMAL LOW (ref 13.0–17.0)
MCH: 33 pg (ref 26.0–34.0)
MCHC: 33.1 g/dL (ref 30.0–36.0)
MCV: 99.7 fL (ref 80.0–100.0)
Platelets: 184 10*3/uL (ref 150–400)
RBC: 3.55 MIL/uL — ABNORMAL LOW (ref 4.22–5.81)
RDW: 14 % (ref 11.5–15.5)
WBC: 10.2 10*3/uL (ref 4.0–10.5)
nRBC: 0 % (ref 0.0–0.2)

## 2018-09-25 LAB — URINALYSIS, ROUTINE W REFLEX MICROSCOPIC
Bilirubin Urine: NEGATIVE
Glucose, UA: NEGATIVE mg/dL
Hgb urine dipstick: NEGATIVE
Ketones, ur: NEGATIVE mg/dL
Leukocytes,Ua: NEGATIVE
Nitrite: NEGATIVE
Protein, ur: NEGATIVE mg/dL
Specific Gravity, Urine: 1.005 (ref 1.005–1.030)
pH: 6 (ref 5.0–8.0)

## 2018-09-25 LAB — BASIC METABOLIC PANEL
Anion gap: 10 (ref 5–15)
BUN: 12 mg/dL (ref 8–23)
CO2: 24 mmol/L (ref 22–32)
Calcium: 7.9 mg/dL — ABNORMAL LOW (ref 8.9–10.3)
Chloride: 100 mmol/L (ref 98–111)
Creatinine, Ser: 0.64 mg/dL (ref 0.61–1.24)
GFR calc Af Amer: >60 mL/min (ref >60)
GFR calc non Af Amer: >60 mL/min (ref >60)
Glucose, Bld: 94 mg/dL (ref 70–99)
Potassium: 3.3 mmol/L — ABNORMAL LOW (ref 3.5–5.1)
Sodium: 134 mmol/L — ABNORMAL LOW (ref 135–145)

## 2018-09-25 LAB — GLUCOSE, CAPILLARY
Glucose-Capillary: 82 mg/dL (ref 70–99)
Glucose-Capillary: 132 mg/dL — ABNORMAL HIGH (ref 70–99)
Glucose-Capillary: 91 mg/dL (ref 70–99)
Glucose-Capillary: 85 mg/dL (ref 70–99)

## 2018-09-25 LAB — MAGNESIUM: Magnesium: 1.7 mg/dL (ref 1.7–2.4)

## 2018-09-25 MED ORDER — POTASSIUM CHLORIDE 10 MEQ/50ML IV SOLN: 10.0000 meq | INTRAVENOUS | Status: DC

## 2018-09-25 MED ORDER — POTASSIUM CHLORIDE 10 MEQ/100ML IV SOLN
10.0000 meq | INTRAVENOUS | Status: AC
  Administered 2018-09-25 (×6): 10 meq via INTRAVENOUS
  Filled 2018-09-25 (×6): qty 100

## 2018-09-25 MED ORDER — GLUCERNA SHAKE PO LIQD
237.0000 mL | Freq: Three times a day (TID) | ORAL | Status: DC
  Administered 2018-09-25 – 2018-10-01 (×3): 237 mL via ORAL

## 2018-09-25 MED ORDER — POTASSIUM CHLORIDE CRYS ER 20 MEQ PO TBCR
40.0000 meq | EXTENDED_RELEASE_TABLET | Freq: Two times a day (BID) | ORAL | Status: DC
  Administered 2018-09-25: 40 meq via ORAL
  Filled 2018-09-25: qty 2

## 2018-09-25 NOTE — Progress Notes (Signed)
   Assessment/Plan: ADMITTED WITH FECAL INCONTINENCE IN SETTING OF ANASTOMOTIC STRICTURE AFTER LAR IN 2013. NO MEDICAL MANAGEMENT AVAILABLE FOR STRICTURE. T INCREASED TO 101 F.  PLAN: 1. SEND UA WITH REFLEX. 2. 1318: CALLED SURGERY CONSULT. DISCUSSED WITH DR. Arnoldo Morale. HOLD XARELTO  PER REQUEST OF DR. Arnoldo Morale. 3. D/C CREON. 4. CHANGE TO CARDIAC LOW FIBER DIET. 5. 1400: DISCUSSED MANAGEMENT OPTIONS WITH WIFE. DAUGHTER AND SHE UNDERSTAND THAT IT IS IN PT'S BEST INTEREST TO HAVE A COLOSTOMY. SON HAS OPPOSED THE IDEA IN THE PAST. 6. AVOID PO KCL WHICH MAY CAUSE DIARRHEA. USE KCL 60 mEQ IV. ORDER PLACED.   Subjective: Since I last evaluated the patient HE CONTINUES WITH MULTIPLE WATERY UNCONTROLLABLE STOOLS. TOLERATING POs. NO NAUSEA. MILD ABDOMINAL PAIN.  Objective: Vital signs in last 24 hours: Vitals:   09/24/18 2145 09/25/18 0522  BP: 123/79 133/69  Pulse: 92 97  Resp: 16 16  Temp: 99.1 F (37.3 C) 99.2 F (37.3 C)  SpO2: 96% 95%   General appearance: alert, cooperative and no distress Resp: clear to auscultation bilaterally Cardio: regular rate and rhythm GI: soft, non-tender; bowel sounds normal;   Lab Results:  K 3.3 Hb 11.7   Studies/Results: No results found.  Medications: I have reviewed the patient's current medications.

## 2018-09-25 NOTE — Progress Notes (Signed)
PROGRESS NOTE    Charles [name]  IPJ:[id number] DOB: 10-13-1950 DOA: 09/22/2018 PCP: [name], MD   Brief Narrative:   Per HPI: Charles Belton Dickersonis a 69 y.o.malewith medical history significant ofhistory of anxiety, depression, type 2 diabetes, GERD, history of high output ileostomy, history of urolithiasis, hypertension, history of colorectal cancer, history of proctectomy, radiation therapy and chemotherapy (last cycle about 6 weeks ago), history of multiple episodes of pneumonia, DVTs, history of pulmonary embolism on Xarelto who is coming to the emergency department due to persistent diarrhea for the past 4 days.He states that for the past 4 days he has been having of 8-12 episodes of loose stools daily associated with mild RUQ pain. The patient states for the past few days every time he tries to eat something area usually resolves in a loose stool bowel movement. He had an episode of emesis earlier today. He denies melena, hematochezia, flank pain, dysuria or frequency, but states that his urine is darker and he has been urinating less often. He has not been febrile until he arrived to the telemetry floor. He states he has an occipital headache, has not been able to eat or sleep properly as well. He has positional dizziness and complaints of occasional pleuritic chest pain, which he thought may be due to his IVC filter placement. However, the patient has fibrosis on the lungs and the Port-A-Cath catheter. He denies rhinorrhea, sore throat, wheezing, hemoptysis, palpitations, diaphoresis, PND, orthopnea, but state he occasionally gets lower extremity edema. No polyuria, polydipsia, polyphagia or blurred vision.  Patient was admitted with diarrhea in the setting of immunocompromise with ongoing chemotherapy. He was also noted to have a dilated gallbladder.  He has C. difficile study has returned negative and GI panel returned positive for enteropathogenic E. coli with  recommendations not to treat with antibiotics at this point.  He has undergone flex sigmoidoscopy on 5/22 with findings of stenotic anastomosis and possible need for diverting colostomy soon.  General surgery consulted by GI on 5/23.  Assessment & Plan:   Principal Problem:   Diarrhea in adult patient Active Problems:   GERD   Peripheral neuropathy due to oxaliplatin-chemotherapy   Diarrhea   DVT (deep venous thrombosis) (HCC)   Colon cancer (HCC)   History of pulmonary embolism   Hypokalemia   Type 2 diabetes mellitus (HCC)   Hypomagnesemia   Dilated gallbladder   Abnormal CT scan, colon   Diarrhea inimmunocompromised patient with EPEC infection and stenotic anastomosis noted on flex sig 5/22 Recommendations for no antibiotics at this time GI has consulted general surgery for evaluation of diverting colostomy Patient and wife to discuss whether night he would be agreeable to procedure  Active Problems: Dilated gallbladder-incidental finding Antiemetics as needed. Analgesics as needed. No signs of acute cholecystitis noted, no LFT elevation noted  Type 2 diabetes mellitus (HCC) Blood glucose stable Hold Amaryl for now CBG monitoring with sensitive sliding scale.  Colon cancer [facility]) Follow-up with oncology as scheduledwith Dr. [name]  Hypokalemia-repleted Replete orally and recheck in a.m. along with magnesium  Hypomagnesemia Replacing. Follow-up levelin a.m.  GERD Continue PPI.  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 twice daily.  DVT (deep venous thrombosis) (HCC) History of pulmonary embolism Continue daily Xarelto.   DVT prophylaxis:Xarelto Code Status:Full Family Communication:None at bedside- will call patient's wife Disposition Plan: Per GI    Consultants:  GI  GS  Procedures:  None  Antimicrobials:   None  Subjective: Patient  seen and  evaluated today with no new acute complaints or concerns. No acute concerns or events noted overnight.  He continues to have some mild diarrhea, but has improved since yesterday.  He is contemplating whether or not he will would accept surgery and will discuss with his wife.  Objective: Vitals:   09/24/18 2047 09/24/18 2145 09/25/18 0522 09/25/18 1315  BP:  123/79 133/69 136/71  Pulse: (!) 103 92 97 98  Resp: 16 16 16 18   Temp:  99.1 F (37.3 C) 99.2 F (37.3 C) (!) 101 F (38.3 C)  TempSrc:  Oral Oral Oral  SpO2: 93% 96% 95% 96%  Weight:      Height:        Intake/Output Summary (Last 24 hours) at 09/25/2018 1320 Last data filed at 09/25/2018 0900 Gross per 24 hour  Intake 1538.04 ml  Output 50 ml  Net 1488.04 ml   Filed Weights   09/22/18 1509 09/22/18 1658  Weight: 39.8 kg 87.5 kg    Examination:  General exam: Appears calm and comfortable  Respiratory system: Clear to auscultation. Respiratory effort normal. Cardiovascular system: S1 & S2 heard, RRR. No JVD, murmurs, rubs, gallops or clicks. No pedal edema. Gastrointestinal system: Abdomen is nondistended, soft and nontender. No organomegaly or masses felt. Normal bowel sounds heard. Central nervous system: Alert and oriented. No focal neurological deficits. Extremities: Symmetric 5 x 5 power. Skin: No rashes, lesions or ulcers Psychiatry: Judgement and insight appear normal. Mood & affect appropriate.     Data Reviewed: I have personally reviewed following labs and imaging studies  CBC: Recent Labs  Lab 09/22/18 1529 09/23/18 0552 09/24/18 0500 09/25/18 0705  WBC 9.0 10.7* 13.8* 10.2  NEUTROABS 7.3  --   --   --   HGB 13.6 12.6* 11.9* 11.7*  HCT 40.6 38.4* 36.0* 35.4*  MCV 99.3 101.1* 100.3* 99.7  PLT 236 225 194 193   Basic Metabolic Panel: Recent Labs  Lab 09/22/18 1529 09/23/18 0552 09/24/18 0500 09/25/18 0705  NA 138 139 134* 134*  K 3.4* 3.8 3.7 3.3*  CL 106 107 103 100  CO2 20* 22 24 24    GLUCOSE 203* 159* 178* 94  BUN 9 7* 11 12  CREATININE 0.66 0.62 0.68 0.64  CALCIUM 8.7* 8.1* 8.1* 7.9*  MG 1.5*  --  1.6* 1.7  PHOS 3.4  --   --   --    GFR: Estimated Creatinine Clearance: 102.8 mL/min (by C-G formula based on SCr of 0.64 mg/dL). Liver Function Tests: Recent Labs  Lab 09/20/18 1218 09/22/18 1529 09/23/18 0552  AST 32 29 22  ALT 78* 50* 38  ALKPHOS 271* 237* 191*  BILITOT 2.3* 2.3* 2.7*  PROT 6.5 6.2* 5.5*  ALBUMIN 3.4* 3.4* 2.8*   Recent Labs  Lab 09/22/18 1529  LIPASE 17   No results for input(s): AMMONIA in the last 168 hours. Coagulation Profile: No results for input(s): INR, PROTIME in the last 168 hours. Cardiac Enzymes: No results for input(s): CKTOTAL, CKMB, CKMBINDEX, TROPONINI in the last 168 hours. BNP (last 3 results) No results for input(s): PROBNP in the last 8760 hours. HbA1C: No results for input(s): HGBA1C in the last 72 hours. CBG: Recent Labs  Lab 09/24/18 1616 09/24/18 1735 09/24/18 2147 09/25/18 0725 09/25/18 1110  GLUCAP 69* 110* 85 82 132*   Lipid Profile: No results for input(s): CHOL, HDL, LDLCALC, TRIG, CHOLHDL, LDLDIRECT in the last 72 hours. Thyroid Function Tests: No results for input(s): TSH, T4TOTAL,  FREET4, T3FREE, THYROIDAB in the last 72 hours. Anemia Panel: No results for input(s): VITAMINB12, FOLATE, FERRITIN, TIBC, IRON, RETICCTPCT in the last 72 hours. Sepsis Labs: Recent Labs  Lab 09/22/18 2339  LATICACIDVEN 1.4    Recent Results (from the past 240 hour(s))  Gastrointestinal Panel by PCR , Stool     Status: Abnormal   Collection Time: 09/22/18  4:04 PM  Result Value Ref Range Status   Campylobacter species NOT DETECTED NOT DETECTED Final   Plesimonas shigelloides NOT DETECTED NOT DETECTED Final   Salmonella species NOT DETECTED NOT DETECTED Final   Yersinia enterocolitica NOT DETECTED NOT DETECTED Final   Vibrio species NOT DETECTED NOT DETECTED Final   Vibrio cholerae NOT DETECTED NOT  DETECTED Final   Enteroaggregative E coli (EAEC) NOT DETECTED NOT DETECTED Final   Enteropathogenic E coli (EPEC) DETECTED (A) NOT DETECTED Final    Comment: CRITICAL RESULT CALLED TO, READ BACK BY AND VERIFIED WITH: CALLED TO DANA COX @1442  09/23/2018 SAC    Enterotoxigenic E coli (ETEC) NOT DETECTED NOT DETECTED Final   Shiga like toxin producing E coli (STEC) NOT DETECTED NOT DETECTED Final   Shigella/Enteroinvasive E coli (EIEC) NOT DETECTED NOT DETECTED Final   Cryptosporidium NOT DETECTED NOT DETECTED Final   Cyclospora cayetanensis NOT DETECTED NOT DETECTED Final   Entamoeba histolytica NOT DETECTED NOT DETECTED Final   Giardia lamblia NOT DETECTED NOT DETECTED Final   Adenovirus F40/41 NOT DETECTED NOT DETECTED Final   Astrovirus NOT DETECTED NOT DETECTED Final   Norovirus GI/GII NOT DETECTED NOT DETECTED Final   Rotavirus A NOT DETECTED NOT DETECTED Final   Sapovirus (I, II, IV, and V) NOT DETECTED NOT DETECTED Final    Comment: Performed at [facility], [address] [place], [place] [address]  C difficile quick scan w PCR reflex     Status: None   Collection Time: 09/22/18  4:05 PM  Result Value Ref Range Status   C Diff antigen NEGATIVE NEGATIVE Final   C Diff toxin NEGATIVE NEGATIVE Final   C Diff interpretation No C. difficile detected.  Final    Comment: VALID Performed at [facility], [address], [place], [place] [address]   SARS Coronavirus 2 (CEPHEID - Performed in [place] hospital lab), Hosp Order     Status: None   Collection Time: 09/22/18  7:46 PM  Result Value Ref Range Status   SARS Coronavirus 2 NEGATIVE NEGATIVE Final    Comment: (NOTE) If result is NEGATIVE SARS-CoV-2 target nucleic acids are NOT DETECTED. The SARS-CoV-2 RNA is generally detectable in upper and lower  respiratory specimens during the acute phase of infection. The lowest  concentration of SARS-CoV-2 viral copies this assay can detect is 250  copies / mL. A negative  result does not preclude SARS-CoV-2 infection  and should not be used as the sole basis for treatment or other  patient management decisions.  A negative result may occur with  improper specimen collection / handling, submission of specimen other  than nasopharyngeal swab, presence of viral mutation(s) within the  areas targeted by this assay, and inadequate number of viral copies  (<250 copies / mL). A negative result must be combined with clinical  observations, patient history, and epidemiological information. If result is POSITIVE SARS-CoV-2 target nucleic acids are DETECTED. The SARS-CoV-2 RNA is generally detectable in upper and lower  respiratory specimens dur ing the acute phase of infection.  Positive  results are indicative of active infection with  SARS-CoV-2.  Clinical  correlation with patient history and other diagnostic information is  necessary to determine patient infection status.  Positive results do  not rule out bacterial infection or co-infection with other viruses. If result is PRESUMPTIVE POSTIVE SARS-CoV-2 nucleic acids MAY BE PRESENT.   A presumptive positive result was obtained on the submitted specimen  and confirmed on repeat testing.  While 2019 novel coronavirus  (SARS-CoV-2) nucleic acids may be present in the submitted sample  additional confirmatory testing may be necessary for epidemiological  and / or clinical management purposes  to differentiate between  SARS-CoV-2 and other Sarbecovirus currently known to infect humans.  If clinically indicated additional testing with an alternate test  methodology [phone]) is advised. The SARS-CoV-2 RNA is generally  detectable in upper and lower respiratory sp ecimens during the acute  phase of infection. The expected result is Negative. Fact Sheet for Patients:  StrictlyIdeas.no Fact Sheet for Healthcare Providers: BankingDealers.co.za This test is not yet  approved or cleared by the [place] FDA and has been authorized for detection and/or diagnosis of SARS-CoV-2 by FDA under an Emergency Use Authorization (EUA).  This EUA will remain in effect (meaning this test can be used) for the duration of the COVID-19 declaration under Section 564(b)(1) of the Act, 21 U.S.C. section 360bbb-3(b)(1), unless the authorization is terminated or revoked sooner. Performed at [facility], [address], [place], [place] [address]   Culture, blood (routine x 2)     Status: None (Preliminary result)   Collection Time: 09/22/18 11:32 PM  Result Value Ref Range Status   Specimen Description BLOOD RIGHT ANTECUBITAL  Final   Special Requests   Final    BOTTLES DRAWN AEROBIC AND ANAEROBIC Blood Culture adequate volume   Culture   Final    NO GROWTH 2 DAYS Performed at [facility], [address], [place],  [place] [address]    Report Status PENDING  Incomplete  Culture, blood (routine x 2)     Status: None (Preliminary result)   Collection Time: 09/22/18 11:39 PM  Result Value Ref Range Status   Specimen Description BLOOD RIGHT HAND  Final   Special Requests   Final    BOTTLES DRAWN AEROBIC AND ANAEROBIC Blood Culture adequate volume   Culture   Final    NO GROWTH 2 DAYS Performed at [facility], [address] [place], [place] [address]    Report Status PENDING  Incomplete         Radiology Studies: No results found.      Scheduled Meds: . diphenoxylate-atropine  1 tablet Oral BID WC  . feeding supplement (GLUCERNA SHAKE)  237 mL Oral TID BM  . gabapentin  900 mg Oral BID  . insulin aspart  0-9 Units Subcutaneous TID WC  . lipase/protease/amylase  108,000 Units Oral TID WC  . lipase/protease/amylase  72,000 Units Oral With snacks  . ondansetron  4 mg Intravenous Once  . pantoprazole  40 mg Oral Daily  . potassium chloride  40 mEq Oral BID  . rivaroxaban  20 mg Oral Q supper  . traZODone  100 mg Oral QHS   Continuous Infusions:    LOS: 0 days    Time spent: 30 minutes    Diamonique Ruedas Darleen Crocker, DO [facility] Pager [phone]  If 7PM-7AM, please contact night-coverage [email or web address] Password [email or web address] 09/25/2018, 1:20 PM

## 2018-09-26 DIAGNOSIS — K913 Postprocedural intestinal obstruction, unspecified as to partial versus complete: Secondary | ICD-10-CM

## 2018-09-26 LAB — CBC
HCT: 34.2 % — ABNORMAL LOW (ref 39.0–52.0)
Hemoglobin: 11.4 g/dL — ABNORMAL LOW (ref 13.0–17.0)
MCH: 33 pg (ref 26.0–34.0)
MCHC: 33.3 g/dL (ref 30.0–36.0)
MCV: 99.1 fL (ref 80.0–100.0)
Platelets: 211 10*3/uL (ref 150–400)
RBC: 3.45 MIL/uL — ABNORMAL LOW (ref 4.22–5.81)
RDW: 14 % (ref 11.5–15.5)
WBC: 7.7 10*3/uL (ref 4.0–10.5)
nRBC: 0 % (ref 0.0–0.2)

## 2018-09-26 LAB — BASIC METABOLIC PANEL
Anion gap: 8 (ref 5–15)
BUN: 10 mg/dL (ref 8–23)
CO2: 24 mmol/L (ref 22–32)
Calcium: 7.8 mg/dL — ABNORMAL LOW (ref 8.9–10.3)
Chloride: 102 mmol/L (ref 98–111)
Creatinine, Ser: 0.65 mg/dL (ref 0.61–1.24)
GFR calc Af Amer: >60 mL/min (ref >60)
GFR calc non Af Amer: >60 mL/min (ref >60)
Glucose, Bld: 116 mg/dL — ABNORMAL HIGH (ref 70–99)
Potassium: 3.9 mmol/L (ref 3.5–5.1)
Sodium: 134 mmol/L — ABNORMAL LOW (ref 135–145)

## 2018-09-26 LAB — HEPARIN LEVEL (UNFRACTIONATED)
Heparin Unfractionated: 0.41 IU/mL (ref 0.30–0.70)
Heparin Unfractionated: 0.81 IU/mL — ABNORMAL HIGH (ref 0.30–0.70)

## 2018-09-26 LAB — GLUCOSE, CAPILLARY
Glucose-Capillary: 116 mg/dL — ABNORMAL HIGH (ref 70–99)
Glucose-Capillary: 141 mg/dL — ABNORMAL HIGH (ref 70–99)
Glucose-Capillary: 102 mg/dL — ABNORMAL HIGH (ref 70–99)
Glucose-Capillary: 111 mg/dL — ABNORMAL HIGH (ref 70–99)

## 2018-09-26 LAB — MAGNESIUM: Magnesium: 1.7 mg/dL (ref 1.7–2.4)

## 2018-09-26 LAB — APTT: aPTT: 52 seconds — ABNORMAL HIGH (ref 24–36)

## 2018-09-26 MED ORDER — HEPARIN (PORCINE) 25000 UT/250ML-% IV SOLN
1300.0000 [IU]/h | INTRAVENOUS | Status: DC
  Administered 2018-09-26: 1400 [IU]/h via INTRAVENOUS
  Administered 2018-09-27 (×2): 1300 [IU]/h via INTRAVENOUS
  Filled 2018-09-26 (×3): qty 250

## 2018-09-26 NOTE — Progress Notes (Signed)
   Assessment/Plan: Admitted with FECAL INCONTINENCE IN SETTING OF STAGE IV RECTAL ADENO CA/ANASTOMOTIC STENOSIS. CLINICALLY STABLE.  PLAN: 1. [place]. HEPARIN GTT STARTED. 2. SURGERY PLANNED FOR MAY 27.    Subjective: Since I last evaluated the patient HE HAS SEEN SURGERY. OK TO PROCEED WITH COLOSTOMY. MILD RLQ PAIN.  No additional questions or concerns.   Objective: Vital signs in last 24 hours: Vitals:   09/25/18 2150 09/26/18 0541  BP: (!) 153/78 (!) 147/79  Pulse: 95 94  Resp: 18 18  Temp: 100.2 F (37.9 C) 98.9 F (37.2 C)  SpO2: 93% 96%  Physical Exam  Constitutional: He is oriented to person, place, and time. No distress.  HENT:  Head: Normocephalic and atraumatic.  Cardiovascular: Normal rate, regular rhythm and normal heart sounds.  Pulmonary/Chest: Effort normal and breath sounds normal. No respiratory distress.  Abdominal: Soft. Bowel sounds are normal. He exhibits no distension. There is abdominal tenderness (mild RLQ). There is no rebound and no guarding.  Neurological: He is alert and oriented to person, place, and time.  NO  NEW FOCAL DEFICITS    Lab Results:  K 3.9 Cr 0.65 Hb 11.4 PLT CT 211 BLD CX NEG x2 pending, UA: NEGATIVE   Studies/Results: Dg Chest Port 1 View  Result Date: 09/25/2018 CLINICAL DATA:  Diarrhea.  History of cancer and diabetes. EXAM: PORTABLE CHEST 1 VIEW COMPARISON:  Radiographs 09/22/2018.  CT 07/30/2018. FINDINGS: 1608 hours. Mild patient rotation to the right. Right subclavian Port-A-Cath appears unchanged at the lower SVC level. The heart size and mediastinal contours are stable. There is stable opacity at the right lung base and mild right lung nodularity. The left lung is clear. There is no pleural effusion or pneumothorax. IMPRESSION: Stable chest without acute findings. Electronically Signed   By: Richardean Sale M.D.   On: 09/25/2018 16:29    Medications: I have reviewed the patient's current medications.

## 2018-09-26 NOTE — Progress Notes (Signed)
ANTICOAGULATION CONSULT NOTE - Initial Consult  Pharmacy Consult for heparin dosing Indication: hx VTE  Allergies  Allergen Reactions  . Oxycodone     Blisters, hallucinations  . Tramadol     Blisters, hallucinations  . Trazodone And Nefazodone Other (See Comments)    hallucinations    Patient Measurements: Height: 6\' 2"  (188 cm) Weight: 192 lb 14.4 oz (87.5 kg) IBW/kg (Calculated) : 82.2 Heparin Dosing Weight: HEPARIN DW (KG): 87.5   Vital Signs: Temp: 98.4 F (36.9 C) (05/24 1408) Temp Source: Oral (05/24 1408) BP: 144/88 (05/24 1408) Pulse Rate: 104 (05/24 1408)  Labs: Recent Labs    09/24/18 0500 09/25/18 0705 09/26/18 0421 09/26/18 1108 09/26/18 1918  HGB 11.9* 11.7* 11.4*  --   --   HCT 36.0* 35.4* 34.2*  --   --   PLT 194 184 211  --   --   APTT  --   --   --  52*  --   HEPARINUNFRC  --   --   --  0.41 0.81*  CREATININE 0.68 0.64 0.65  --   --     Estimated Creatinine Clearance: 102.8 mL/min (by C-G formula based on SCr of 0.65 mg/dL).   Medical History: Past Medical History:  Diagnosis Date  . Chronic anticoagulation   . Colon cancer ([place]) 07/24/2010   rectal ca, inv adenocarcinoma  . Depression 04/01/2011  . Diabetes mellitus without complication ([place])   . DVT (deep venous thrombosis) ([place]) 05/09/2011  . GERD (gastroesophageal reflux disease)   . High output ileostomy ([place]) 05/21/2011  . History of kidney stones   . HTN (hypertension)   . Hx of radiation therapy 09/02/10 to 10/14/10   pelvis  . Lung metastasis ([place])   . Neuropathy   . Peripheral vascular disease ([place])    dvt's,pe  . Pneumonia    hx x3  . Pulmonary embolism ([place])   . Rectal cancer ([place]) 01/15/2011   S/P radiation and concurrent 5-FU continuous infusion from 09/09/10- 10/10/10.  S/P proctectomy with colorectal anastomosis and diverting loop ileostomy on 11/14/10 at [facility] by Dr. Harlon Ditty. Pathology reveals a pT3b N1 with 3/20 lymph nodes.      Assessment: Pharmacy consulted  to dose heparin  for this 67 yo male with metastatic rectal carcinoma and history of VTE.  Patient has been on rivaroxaban and received his last dose at 1642 on 09/25/18.  Goal of Therapy:  Heparin level 0.3-0.7 units/ml Monitor platelets by anticoagulation protocol: Yes   Plan:  2000 update: heparin level is 0.81 IU/mL, slightly above therapeutic goal range.  Decrease heparin infusion to  1300  units/hr  Check anti-Xa level daily while on heparin Continue to monitor H&H and platelets  Despina Pole 09/26/2018,8:21 PM

## 2018-09-26 NOTE — Consult Note (Signed)
Reason for Consult: Metastatic colorectal cancer, anastomotic stenosis Referring Physician: Dr. Melanee Spry is an 68 y.o. male.  HPI: Patient is a 68 year old white male with multiple medical problems who is undergoing chemoradiation therapy for metastatic colorectal carcinoma who was found on recent flexible sigmoidoscopy to have a significant narrowing of the rectal anastomosis done at [facility] in 2012.  Patient frequently goes to the bathroom and has tenesmus as well as nausea.  When he eats he feels full very quickly.  Please see H&P.  I have been asked to see the patient for consideration of a transverse loop colostomy.  Past Medical History:  Diagnosis Date  . Chronic anticoagulation   . Colon cancer ([place]) 07/24/2010   rectal ca, inv adenocarcinoma  . Depression 04/01/2011  . Diabetes mellitus without complication ([place])   . DVT (deep venous thrombosis) ([place]) 05/09/2011  . GERD (gastroesophageal reflux disease)   . High output ileostomy ([place]) 05/21/2011  . History of kidney stones   . HTN (hypertension)   . Hx of radiation therapy 09/02/10 to 10/14/10   pelvis  . Lung metastasis ([place])   . Neuropathy   . Peripheral vascular disease ([place])    dvt's,pe  . Pneumonia    hx x3  . Pulmonary embolism ([place])   . Rectal cancer ([place]) 01/15/2011   S/P radiation and concurrent 5-FU continuous infusion from 09/09/10- 10/10/10.  S/P proctectomy with colorectal anastomosis and diverting loop ileostomy on 11/14/10 at [facility] by Dr. Harlon Ditty. Pathology reveals a pT3b N1 with 3/20 lymph nodes.      Past Surgical History:  Procedure Laterality Date  . BILIARY STENT PLACEMENT N/A 09/16/2018   Procedure: STENT PLACEMENT;  Surgeon: Rogene Houston, MD;  [facility];  Service: Endoscopy;  Laterality: N/A;  . COLON SURGERY  11/14/2010   proctectomy with colorectal anastomosis and diverting loop ileostomy (temporary planned)  . COLONOSCOPY  07/2010   proximal rectal apple core  mass 10-14cm from anal verge (adenocarcinoma), 2-3cm distal rectal carpet polyp s/p piecemeal snare polypectomy (adenoma)  . COLONOSCOPY  04/21/2012   RMR: Friable,fibrotic appearing colorectal anastomosis producing some luminal narrowing-not felt to be critical. path: focal erosion with slight inflammation and hyperemia. SURVEILLANCE DUE DEC 2015  . COLONOSCOPY N/A 11/24/2013   Dr. Rourk:somewhat fibrotic/friable anastomotic mucosal-status post biopsy (narrowing not felt to be clinically significant) Single colonic diverticulum. benign polypoid rectal mucosa  . colostomy reversal  april 2013  . ERCP N/A 09/16/2018   Procedure: ENDOSCOPIC RETROGRADE CHOLANGIOPANCREATOGRAPHY (ERCP);  Surgeon: Rogene Houston, MD;  [facility];  Service: Endoscopy;  Laterality: N/A;  . ESOPHAGOGASTRODUODENOSCOPY  07/2010   RMR: schatki ring s/p dilation, small hh, SB bx benign  . ESOPHAGOGASTRODUODENOSCOPY N/A 09/04/2015   Procedure: ESOPHAGOGASTRODUODENOSCOPY (EGD);  Surgeon: Daneil Dolin, MD;  [facility];  Service: Endoscopy;  Laterality: N/A;  730  . EUS  08/2010   Dr. Owens Loffler. uT3N0 circumferential, nearly obstruction rectosigmoid adenocarcinoma, distal edge 12cm from anal verge  . EUS N/A 06/19/2016   Procedure: UPPER ENDOSCOPIC ULTRASOUND (EUS) RADIAL;  Surgeon: Milus Banister, MD;  [facility];  Service: Endoscopy;  Laterality: N/A;  . HERNIA REPAIR     abd hernia repair  . IVC filter    . ivc filter    . port a cath placement    . PORT-A-CATH REMOVAL  09/24/2011   Procedure: REMOVAL PORT-A-CATH;  Surgeon: Donato Heinz, MD;  Location: [facility]  ORS;  Service: General;  Laterality: N/A;  Minor Room  . PORTACATH PLACEMENT Right 07/07/2016   Procedure: INSERTION PORT-A-CATH;  Surgeon: Aviva Signs, MD;  Location: [facility] ORS;  Service: General;  Laterality: Right;  . SPHINCTEROTOMY N/A 09/16/2018   Procedure: SPHINCTEROTOMY with balloon dialation;  Surgeon: Rogene Houston, MD;   [facility];  Service: Endoscopy;  Laterality: N/A;  . VIDEO ASSISTED THORACOSCOPY (VATS)/WEDGE RESECTION Right 11/15/2014   Procedure: VIDEO ASSISTED THORACOSCOPY (VATS)/LUNG RESECTION WITH RIGHT LINGULECTOMY;  Surgeon: [name], MD;  Location: [place];  Service: Thoracic;  Laterality: Right;  Marland Kitchen VIDEO BRONCHOSCOPY N/A 11/15/2014   Procedure: VIDEO BRONCHOSCOPY;  Surgeon: [name], MD;  Location: [facility] OR;  Service: Thoracic;  Laterality: N/A;    Family History  Problem Relation Age of Onset  . Cancer Brother        throat  . Cancer Brother        prostate  . Colon cancer Neg Hx   . Liver disease Neg Hx   . Inflammatory bowel disease Neg Hx     Social History:  reports that he has never smoked. He has never used smokeless tobacco. He reports that he does not drink alcohol or use drugs.  Allergies:  Allergies  Allergen Reactions  . Oxycodone     Blisters, hallucinations  . Tramadol     Blisters, hallucinations  . Trazodone And Nefazodone Other (See Comments)    hallucinations    Medications: I have reviewed the patient's current medications.  Results for orders placed or performed during the hospital encounter of 09/22/18 (from the past 48 hour(s))  Glucose, capillary     Status: None   Collection Time: 09/24/18 11:25 AM  Result Value Ref Range   Glucose-Capillary 98 70 - 99 mg/dL   Comment 1 Notify RN   Glucose, capillary     Status: Abnormal   Collection Time: 09/24/18  4:16 PM  Result Value Ref Range   Glucose-Capillary 69 (L) 70 - 99 mg/dL   Comment 1 Notify RN    Comment 2 Document in Chart   Glucose, capillary     Status: Abnormal   Collection Time: 09/24/18  5:35 PM  Result Value Ref Range   Glucose-Capillary 110 (H) 70 - 99 mg/dL   Comment 1 Notify RN    Comment 2 Document in Chart   Glucose, capillary     Status: None   Collection Time: 09/24/18  9:47 PM  Result Value Ref Range   Glucose-Capillary 85 70 - 99 mg/dL  CBC     Status:  Abnormal   Collection Time: 09/25/18  7:05 AM  Result Value Ref Range   WBC 10.2 4.0 - 10.5 K/uL   RBC 3.55 (L) 4.22 - 5.81 MIL/uL   Hemoglobin 11.7 (L) 13.0 - 17.0 g/dL   HCT 35.4 (L) 39.0 - 52.0 %   MCV 99.7 80.0 - 100.0 fL   MCH 33.0 26.0 - 34.0 pg   MCHC 33.1 30.0 - 36.0 g/dL   RDW 14.0 11.5 - 15.5 %   Platelets 184 150 - 400 K/uL   nRBC 0.0 0.0 - 0.2 %    Comment: Performed at [facility], [address], [place], [place] [address]  Basic metabolic panel     Status: Abnormal   Collection Time: 09/25/18  7:05 AM  Result Value Ref Range   Sodium 134 (L) 135 - 145 mmol/L   Potassium 3.3 (L) 3.5 - 5.1 mmol/L  Chloride 100 98 - 111 mmol/L   CO2 24 22 - 32 mmol/L   Glucose, Bld 94 70 - 99 mg/dL   BUN 12 8 - 23 mg/dL   Creatinine, Ser 0.64 0.61 - 1.24 mg/dL   Calcium 7.9 (L) 8.9 - 10.3 mg/dL   GFR calc non Af Amer >60 >60 mL/min   GFR calc Af Amer >60 >60 mL/min   Anion gap 10 5 - 15    Comment: Performed at [facility], [address], [place], [place] [address]  Magnesium     Status: None   Collection Time: 09/25/18  7:05 AM  Result Value Ref Range   Magnesium 1.7 1.7 - 2.4 mg/dL    Comment: Performed at [facility], [address] [place], [place] [address]  Glucose, capillary     Status: None   Collection Time: 09/25/18  7:25 AM  Result Value Ref Range   Glucose-Capillary 82 70 - 99 mg/dL  Glucose, capillary     Status: Abnormal   Collection Time: 09/25/18 11:10 AM  Result Value Ref Range   Glucose-Capillary 132 (H) 70 - 99 mg/dL  Urinalysis, Routine w reflex microscopic     Status: None   Collection Time: 09/25/18  3:15 PM  Result Value Ref Range   Color, Urine YELLOW YELLOW   APPearance CLEAR CLEAR   Specific Gravity, Urine 1.005 1.005 - 1.030   pH 6.0 5.0 - 8.0   Glucose, UA NEGATIVE NEGATIVE mg/dL   Hgb urine dipstick NEGATIVE NEGATIVE   Bilirubin Urine NEGATIVE NEGATIVE   Ketones, ur NEGATIVE NEGATIVE mg/dL   Protein, ur NEGATIVE NEGATIVE mg/dL   Nitrite  NEGATIVE NEGATIVE   Leukocytes,Ua NEGATIVE NEGATIVE    Comment: Performed at [facility], [address] [place], [place] [address]  Glucose, capillary     Status: None   Collection Time: 09/25/18  4:14 PM  Result Value Ref Range   Glucose-Capillary 91 70 - 99 mg/dL  Culture, blood (routine x 2)     Status: None (Preliminary result)   Collection Time: 09/25/18  4:16 PM  Result Value Ref Range   Specimen Description RIGHT ANTECUBITAL    Special Requests      BOTTLES DRAWN AEROBIC AND ANAEROBIC Blood Culture adequate volume   Culture      NO GROWTH < 12 HOURS Performed at  [facility], [address], [place], [place] [address]    Report Status PENDING   Culture, blood (routine x 2)     Status: None (Preliminary result)   Collection Time: 09/25/18  4:16 PM  Result Value Ref Range   Specimen Description BLOOD RIGHT ARM    Special Requests      BOTTLES DRAWN AEROBIC AND ANAEROBIC Blood Culture adequate volume   Culture      NO GROWTH < 12 HOURS Performed at [facility], [address] [place], [place] [address]    Report Status PENDING   Glucose, capillary     Status: None   Collection Time: 09/25/18  9:52 PM  Result Value Ref Range   Glucose-Capillary 85 70 - 99 mg/dL   Comment 1 Notify RN    Comment 2 Document in Chart   CBC     Status: Abnormal   Collection Time: 09/26/18  4:21 AM  Result Value Ref Range   WBC 7.7 4.0 - 10.5 K/uL   RBC 3.45 (L) 4.22 - 5.81 MIL/uL   Hemoglobin 11.4 (L) 13.0 - 17.0 g/dL   HCT 34.2 (  L) 39.0 - 52.0 %   MCV 99.1 80.0 - 100.0 fL   MCH 33.0 26.0 - 34.0 pg   MCHC 33.3 30.0 - 36.0 g/dL   RDW 14.0 11.5 - 15.5 %   Platelets 211 150 - 400 K/uL   nRBC 0.0 0.0 - 0.2 %    Comment: Performed at [facility], [address], [place], [place] [address]  Basic metabolic panel     Status: Abnormal   Collection Time: 09/26/18  4:21 AM  Result Value Ref Range   Sodium 134 (L) 135 - 145 mmol/L   Potassium 3.9 3.5 - 5.1 mmol/L   Chloride 102 98 - 111  mmol/L   CO2 24 22 - 32 mmol/L   Glucose, Bld 116 (H) 70 - 99 mg/dL   BUN 10 8 - 23 mg/dL   Creatinine, Ser 0.65 0.61 - 1.24 mg/dL   Calcium 7.8 (L) 8.9 - 10.3 mg/dL   GFR calc non Af Amer >60 >60 mL/min   GFR calc Af Amer >60 >60 mL/min   Anion gap 8 5 - 15    Comment: Performed at [facility], [address] [place], [place] [address]  Magnesium     Status: None   Collection Time: 09/26/18  4:21 AM  Result Value Ref Range   Magnesium 1.7 1.7 - 2.4 mg/dL    Comment: Performed at [facility], [address], [place], [place] [address]  Glucose, capillary     Status: Abnormal   Collection Time: 09/26/18  7:15 AM  Result Value Ref Range   Glucose-Capillary 116 (H) 70 - 99 mg/dL    Dg Chest Port 1 View  Result Date: 09/25/2018 CLINICAL DATA:  Diarrhea.  History of cancer and diabetes. EXAM: PORTABLE CHEST 1 VIEW COMPARISON:  Radiographs 09/22/2018.  CT 07/30/2018. FINDINGS: 1608 hours. Mild patient rotation to the right. Right subclavian Port-A-Cath appears unchanged at the lower SVC level. The heart size and mediastinal contours are stable. There is stable opacity at the right lung base and mild right lung nodularity. The left lung is clear. There is no pleural effusion or pneumothorax. IMPRESSION: Stable chest without acute findings. Electronically Signed   By: Richardean Sale M.D.   On: 09/25/2018 16:29    ROS:  Pertinent items are noted in HPI.  Blood pressure (!) 147/79, pulse 94, temperature 98.9 F (37.2 C), temperature source Oral, resp. rate 18, height 6\' 2"  (1.88 m), weight 87.5 kg, SpO2 96 %. Physical Exam: Pleasant white male no acute distress Head is normocephalic, atraumatic Lungs clear to auscultation with good breath sounds bilaterally Heart examination reveals a regular rate and rhythm without S3, S4, murmurs Abdomen is soft, nontender, nondistended.  No rigidity is noted.  A Pfannenstiel surgical scar as well as a right lower quadrant surgical scar are  present.  CT scan images personally reviewed  Assessment/Plan: Impression: Metastatic colorectal carcinoma with anastomotic stenosis.  Chronic anticoagulation due to history of DVT with PE. Plan: I had extensive discussion with the patient and wife.  Patient would like to proceed with a transverse loop colostomy.  Risks and benefits of the procedure including bleeding, infection, cardiopulmonary difficulties, and the instructions that he will still has some fluid in stool through the rectum were fully explained to the patient, who gave informed consent.  We will schedule surgery for Wednesday, 09/29/2018. Aviva Signs 09/26/2018, 10:02 AM

## 2018-09-26 NOTE — Progress Notes (Addendum)
ANTICOAGULATION CONSULT NOTE - Initial Consult  Pharmacy Consult for heparin dosing Indication: hx VTE  Allergies  Allergen Reactions  . Oxycodone     Blisters, hallucinations  . Tramadol     Blisters, hallucinations  . Trazodone And Nefazodone Other (See Comments)    hallucinations    Patient Measurements: Height: 6\' 2"  (188 cm) Weight: 193 lb (87.5 kg) IBW/kg (Calculated) : 82.2 Heparin Dosing Weight: HEPARIN DW (KG): 87.5   Vital Signs: Temp: 98.9 F (37.2 C) (05/24 0541) Temp Source: Oral (05/24 0541) BP: 147/79 (05/24 0541) Pulse Rate: 94 (05/24 0541)  Labs: Recent Labs    09/24/18 0500 09/25/18 0705 09/26/18 0421  HGB 11.9* 11.7* 11.4*  HCT 36.0* 35.4* 34.2*  PLT 194 184 211  CREATININE 0.68 0.64 0.65    Estimated Creatinine Clearance: 102.8 mL/min (by C-G formula based on SCr of 0.65 mg/dL).   Medical History: Past Medical History:  Diagnosis Date  . Chronic anticoagulation   . Colon cancer ([place]) 07/24/2010   rectal ca, inv adenocarcinoma  . Depression 04/01/2011  . Diabetes mellitus without complication ([place])   . DVT (deep venous thrombosis) ([place]) 05/09/2011  . GERD (gastroesophageal reflux disease)   . High output ileostomy ([place]) 05/21/2011  . History of kidney stones   . HTN (hypertension)   . Hx of radiation therapy 09/02/10 to 10/14/10   pelvis  . Lung metastasis ([place])   . Neuropathy   . Peripheral vascular disease ([place])    dvt's,pe  . Pneumonia    hx x3  . Pulmonary embolism ([place])   . Rectal cancer ([place]) 01/15/2011   S/P radiation and concurrent 5-FU continuous infusion from 09/09/10- 10/10/10.  S/P proctectomy with colorectal anastomosis and diverting loop ileostomy on 11/14/10 at [facility] by Dr. Harlon Ditty. Pathology reveals a pT3b N1 with 3/20 lymph nodes.      Assessment: Pharmacy consulted to dose heparin  for this 68 yo male with metastatic rectal carcinoma and history of VTE.  Patient has been on rivaroxaban and received his last dose  at 1642 on 09/25/18.  Goal of Therapy:  Heparin level 0.3-0.7 units/ml Monitor platelets by anticoagulation protocol: Yes   Plan:   Start heparin infusion at 1400 units/hr  Check anti-Xa level in 6-8 hours and daily while on heparin Continue to monitor H&H and platelets  Despina Pole 09/26/2018,11:09 AM

## 2018-09-26 NOTE — Progress Notes (Signed)
PROGRESS NOTE    Charles [name]  KXF:[id number] DOB: 03-Jun-1950 DOA: 09/22/2018 PCP: Charles Chroman, MD   Brief Narrative:  Per HPI: Charles Belton Dickersonis a 68 y.o.malewith medical history significant ofhistory of anxiety, depression, type 2 diabetes, GERD, history of high output ileostomy, history of urolithiasis, hypertension, history of colorectal cancer, history of proctectomy, radiation therapy and chemotherapy (last cycle about 6 weeks ago), history of multiple episodes of pneumonia, DVTs, history of pulmonary embolism on Xarelto who is coming to the emergency department due to persistent diarrhea for the past 4 days.He states that for the past 4 days he has been having of 8-12 episodes of loose stools daily associated with mild RUQ pain. The patient states for the past few days every time he tries to eat something area usually resolves in a loose stool bowel movement. He had an episode of emesis earlier today. He denies melena, hematochezia, flank pain, dysuria or frequency, but states that his urine is darker and he has been urinating less often. He has not been febrile until he arrived to the telemetry floor. He states he has an occipital headache, has not been able to eat or sleep properly as well. He has positional dizziness and complaints of occasional pleuritic chest pain, which he thought may be due to his IVC filter placement. However, the patient has fibrosis on the lungs and the Port-A-Cath catheter. He denies rhinorrhea, sore throat, wheezing, hemoptysis, palpitations, diaphoresis, PND, orthopnea, but state he occasionally gets lower extremity edema. No polyuria, polydipsia, polyphagia or blurred vision.  Patient was admitted with diarrhea in the setting of immunocompromise with ongoing chemotherapy. He was also noted to have a dilated gallbladder.He has C. difficile study has returned negative and GI panel returned positive for enteropathogenic E. coli with  recommendations not to treat with antibiotics at this point. He has undergone flex sigmoidoscopy on 5/22 with findings of stenotic anastomosis and possible need for diverting colostomy soon.  General surgery consulted by GI on 5/23.  Dr. Arnoldo [name] has evaluated patient this a.m. and patient is agreeable to surgery which will be performed on 5/27.  Will start heparin drip in the meantime.   Assessment & Plan:   Principal Problem:   Diarrhea in adult patient Active Problems:   GERD   Peripheral neuropathy due to oxaliplatin-chemotherapy   Diarrhea   DVT (deep venous thrombosis) (HCC)   Colon cancer (HCC)   History of pulmonary embolism   Hypokalemia   Type 2 diabetes mellitus (HCC)   Hypomagnesemia   Dilated gallbladder   Abnormal CT scan, colon   Anastomotic stricture of colorectal region   Diarrhea inimmunocompromised patientwith EPEC infection and stenotic anastomosis noted on flex sig 5/22 Recommendations for no antibiotics at this time Plan for diverting colostomy on 5/27 Heparin drip in the meantime Continue current diet and use IV fluid as needed for dehydration  Active Problems: Dilated gallbladder-incidental finding Antiemetics as needed. Analgesics as needed. No signs of acute cholecystitis noted, no LFT elevation noted  Type 2 diabetes mellitus (HCC) Blood glucose stable Hold Amaryl for now CBG monitoring with sensitive sliding scale.  Colon cancer [facility] Follow-up with oncology as scheduledwith Dr. Delton [name]  Hypokalemia-repleted Recheck BMP in a.m.  Hypomagnesemia Resolved  GERD Continue PPI.  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 twice daily.  DVT (deep venous thrombosis) (HCC) History of pulmonary embolism Start heparin drip with anticipated surgery on 5/27   DVT prophylaxis:Heparin drip Code Status:Full Family Communication:Discussed with wife  on  5/23 Disposition Plan:Per general surgery with anticipated procedure on 5/27   Consultants:  GI  GS  Procedures:  None  Antimicrobials:   None  Subjective: Patient seen and evaluated today with no new acute complaints or concerns. No acute concerns or events noted overnight.  He continues to have loose, liquidy bowel movements that are frequent.  Objective: Vitals:   09/25/18 2048 09/25/18 2150 09/26/18 0541 09/26/18 1100  BP:  (!) 153/78 (!) 147/79   Pulse: 91 95 94   Resp: 16 18 18    Temp:  100.2 F (37.9 C) 98.9 F (37.2 C)   TempSrc:  Oral Oral   SpO2: 91% 93% 96%   Weight:    87.5 kg  Height:    6\' 2"  (1.88 m)    Intake/Output Summary (Last 24 hours) at 09/26/2018 1124 Last data filed at 09/26/2018 0634 Gross per 24 hour  Intake 644.89 ml  Output 1300 ml  Net -655.11 ml   Filed Weights   09/22/18 1509 09/22/18 1658 09/26/18 1100  Weight: 39.8 kg 87.5 kg 87.5 kg    Examination:  General exam: Appears calm and comfortable  Respiratory system: Clear to auscultation. Respiratory effort normal. Cardiovascular system: S1 & S2 heard, RRR. No JVD, murmurs, rubs, gallops or clicks. No pedal edema. Gastrointestinal system: Abdomen is nondistended, soft and nontender. No organomegaly or masses felt. Normal bowel sounds heard. Central nervous system: Alert and oriented. No focal neurological deficits. Extremities: Symmetric 5 x 5 power. Skin: No rashes, lesions or ulcers Psychiatry: Judgement and insight appear normal. Mood & affect appropriate.     Data Reviewed: I have personally reviewed following labs and imaging studies  CBC: Recent Labs  Lab 09/22/18 1529 09/23/18 0552 09/24/18 0500 09/25/18 0705 09/26/18 0421  WBC 9.0 10.7* 13.8* 10.2 7.7  NEUTROABS 7.3  --   --   --   --   HGB 13.6 12.6* 11.9* 11.7* 11.4*  HCT 40.6 38.4* 36.0* 35.4* 34.2*  MCV 99.3 101.1* 100.3* 99.7 99.1  PLT 236 225 194 184 062   Basic Metabolic Panel: Recent  Labs  Lab 09/22/18 1529 09/23/18 0552 09/24/18 0500 09/25/18 0705 09/26/18 0421  NA 138 139 134* 134* 134*  K 3.4* 3.8 3.7 3.3* 3.9  CL 106 107 103 100 102  CO2 20* 22 24 24 24   GLUCOSE 203* 159* 178* 94 116*  BUN 9 7* 11 12 10   CREATININE 0.66 0.62 0.68 0.64 0.65  CALCIUM 8.7* 8.1* 8.1* 7.9* 7.8*  MG 1.5*  --  1.6* 1.7 1.7  PHOS 3.4  --   --   --   --    GFR: Estimated Creatinine Clearance: 102.8 mL/min (by C-G formula based on SCr of 0.65 mg/dL). Liver Function Tests: Recent Labs  Lab 09/20/18 1218 09/22/18 1529 09/23/18 0552  AST 32 29 22  ALT 78* 50* 38  ALKPHOS 271* 237* 191*  BILITOT 2.3* 2.3* 2.7*  PROT 6.5 6.2* 5.5*  ALBUMIN 3.4* 3.4* 2.8*   Recent Labs  Lab 09/22/18 1529  LIPASE 17   No results for input(s): AMMONIA in the last 168 hours. Coagulation Profile: No results for input(s): INR, PROTIME in the last 168 hours. Cardiac Enzymes: No results for input(s): CKTOTAL, CKMB, CKMBINDEX, TROPONINI in the last 168 hours. BNP (last 3 results) No results for input(s): PROBNP in the last 8760 hours. HbA1C: No results for input(s): HGBA1C in the last 72 hours. CBG: Recent Labs  Lab 09/25/18 1110 09/25/18 1614 09/25/18  2152 09/26/18 0715 09/26/18 1105  GLUCAP 132* 91 85 116* 141*   Lipid Profile: No results for input(s): CHOL, HDL, LDLCALC, TRIG, CHOLHDL, LDLDIRECT in the last 72 hours. Thyroid Function Tests: No results for input(s): TSH, T4TOTAL, FREET4, T3FREE, THYROIDAB in the last 72 hours. Anemia Panel: No results for input(s): VITAMINB12, FOLATE, FERRITIN, TIBC, IRON, RETICCTPCT in the last 72 hours. Sepsis Labs: Recent Labs  Lab 09/22/18 2339  LATICACIDVEN 1.4    Recent Results (from the past 240 hour(s))  Gastrointestinal Panel by PCR , Stool     Status: Abnormal   Collection Time: 09/22/18  4:04 PM  Result Value Ref Range Status   Campylobacter species NOT DETECTED NOT DETECTED Final   Plesimonas shigelloides NOT DETECTED NOT  DETECTED Final   Salmonella species NOT DETECTED NOT DETECTED Final   Yersinia enterocolitica NOT DETECTED NOT DETECTED Final   Vibrio species NOT DETECTED NOT DETECTED Final   Vibrio cholerae NOT DETECTED NOT DETECTED Final   Enteroaggregative E coli (EAEC) NOT DETECTED NOT DETECTED Final   Enteropathogenic E coli (EPEC) DETECTED (A) NOT DETECTED Final    Comment: CRITICAL RESULT CALLED TO, READ BACK BY AND VERIFIED WITH: CALLED TO DANA COX @1442  09/23/2018 SAC    Enterotoxigenic E coli (ETEC) NOT DETECTED NOT DETECTED Final   Shiga like toxin producing E coli (STEC) NOT DETECTED NOT DETECTED Final   Shigella/Enteroinvasive E coli (EIEC) NOT DETECTED NOT DETECTED Final   Cryptosporidium NOT DETECTED NOT DETECTED Final   Cyclospora cayetanensis NOT DETECTED NOT DETECTED Final   Entamoeba histolytica NOT DETECTED NOT DETECTED Final   Giardia lamblia NOT DETECTED NOT DETECTED Final   Adenovirus F40/41 NOT DETECTED NOT DETECTED Final   Astrovirus NOT DETECTED NOT DETECTED Final   Norovirus GI/GII NOT DETECTED NOT DETECTED Final   Rotavirus A NOT DETECTED NOT DETECTED Final   Sapovirus (I, II, IV, and V) NOT DETECTED NOT DETECTED Final    Comment: Performed at [facility], [address] [place], [place] [address]  C difficile quick scan w PCR reflex     Status: None   Collection Time: 09/22/18  4:05 PM  Result Value Ref Range Status   C Diff antigen NEGATIVE NEGATIVE Final   C Diff toxin NEGATIVE NEGATIVE Final   C Diff interpretation No C. difficile detected.  Final    Comment: VALID Performed at [facility], [address], [place], [place] [address]   SARS Coronavirus 2 (CEPHEID - Performed in [place] hospital lab), Hosp Order     Status: None   Collection Time: 09/22/18  7:46 PM  Result Value Ref Range Status   SARS Coronavirus 2 NEGATIVE NEGATIVE Final    Comment: (NOTE) If result is NEGATIVE SARS-CoV-2 target nucleic acids are NOT DETECTED. The SARS-CoV-2 RNA  is generally detectable in upper and lower  respiratory specimens during the acute phase of infection. The lowest  concentration of SARS-CoV-2 viral copies this assay can detect is 250  copies / mL. A negative result does not preclude SARS-CoV-2 infection  and should not be used as the sole basis for treatment or other  patient management decisions.  A negative result may occur with  improper specimen collection / handling, submission of specimen other  than nasopharyngeal swab, presence of viral mutation(s) within the  areas targeted by this assay, and inadequate number of viral copies  (<250 copies / mL). A negative result must be combined with clinical  observations, patient history, and epidemiological information.  If result is POSITIVE SARS-CoV-2 target nucleic acids are DETECTED. The SARS-CoV-2 RNA is generally detectable in upper and lower  respiratory specimens dur ing the acute phase of infection.  Positive  results are indicative of active infection with SARS-CoV-2.  Clinical  correlation with patient history and other diagnostic information is  necessary to determine patient infection status.  Positive results do  not rule out bacterial infection or co-infection with other viruses. If result is PRESUMPTIVE POSTIVE SARS-CoV-2 nucleic acids MAY BE PRESENT.   A presumptive positive result was obtained on the submitted specimen  and confirmed on repeat testing.  While 2019 novel coronavirus  (SARS-CoV-2) nucleic acids may be present in the submitted sample  additional confirmatory testing may be necessary for epidemiological  and / or clinical management purposes  to differentiate between  SARS-CoV-2 and other Sarbecovirus currently known to infect humans.  If clinically indicated additional testing with an alternate test  methodology [phone]) is advised. The SARS-CoV-2 RNA is generally  detectable in upper and lower respiratory sp ecimens during the acute  phase of  infection. The expected result is Negative. Fact Sheet for Patients:  StrictlyIdeas.no Fact Sheet for Healthcare Providers: BankingDealers.co.za This test is not yet approved or cleared by the [place] FDA and has been authorized for detection and/or diagnosis of SARS-CoV-2 by FDA under an Emergency Use Authorization (EUA).  This EUA will remain in effect (meaning this test can be used) for the duration of the COVID-19 declaration under Section 564(b)(1) of the Act, 21 U.S.C. section 360bbb-3(b)(1), unless the authorization is terminated or revoked sooner. Performed at [facility], [address], [place], [place] [address]   Culture, blood (routine x 2)     Status: None (Preliminary result)   Collection Time: 09/22/18 11:32 PM  Result Value Ref Range Status   Specimen Description BLOOD RIGHT ANTECUBITAL  Final   Special Requests   Final    BOTTLES DRAWN AEROBIC AND ANAEROBIC Blood Culture adequate volume   Culture   Final    NO GROWTH 4 DAYS Performed at [facility], [address] [place], [place] [address]    Report Status PENDING  Incomplete  Culture, blood (routine x 2)     Status: None (Preliminary result)   Collection Time: 09/22/18 11:39 PM  Result Value Ref Range Status   Specimen Description BLOOD RIGHT HAND  Final   Special Requests   Final    BOTTLES DRAWN AEROBIC AND ANAEROBIC Blood Culture adequate volume   Culture   Final    NO GROWTH 4 DAYS Performed at [facility], [address], [place], [place] [address]    Report Status PENDING  Incomplete  Culture, blood (routine x 2)     Status: None (Preliminary result)   Collection Time: 09/25/18  4:16 PM  Result Value Ref Range Status   Specimen Description RIGHT ANTECUBITAL  Final   Special Requests   Final    BOTTLES DRAWN AEROBIC AND ANAEROBIC Blood Culture adequate volume   Culture   Final    NO GROWTH < 12 HOURS Performed at [facility], [address] [place], [place] [address]    Report Status PENDING  Incomplete  Culture, blood (routine x 2)     Status: None (Preliminary result)   Collection Time: 09/25/18  4:16 PM  Result Value Ref Range Status   Specimen Description BLOOD RIGHT ARM  Final   Special Requests   Final    BOTTLES DRAWN AEROBIC AND ANAEROBIC  Blood Culture adequate volume   Culture   Final    NO GROWTH < 12 HOURS Performed at [facility], [address] [place], [place] [address]    Report Status PENDING  Incomplete         Radiology Studies: Dg Chest Port 1 View  Result Date: 09/25/2018 CLINICAL DATA:  Diarrhea.  History of cancer and diabetes. EXAM: PORTABLE CHEST 1 VIEW COMPARISON:  Radiographs 09/22/2018.  CT 07/30/2018. FINDINGS: 1608 hours. Mild patient rotation to the right. Right subclavian Port-A-Cath appears unchanged at the lower SVC level. The heart size and mediastinal contours are stable. There is stable opacity at the right lung base and mild right lung nodularity. The left lung is clear. There is no pleural effusion or pneumothorax. IMPRESSION: Stable chest without acute findings. Electronically Signed   By: Richardean Sale M.D.   On: 09/25/2018 16:29        Scheduled Meds:  diphenoxylate-atropine  1 tablet Oral BID WC   feeding supplement (GLUCERNA SHAKE)  237 mL Oral TID BM   gabapentin  900 mg Oral BID   insulin aspart  0-9 Units Subcutaneous TID WC   ondansetron  4 mg Intravenous Once   pantoprazole  40 mg Oral Daily   traZODone  100 mg Oral QHS   Continuous Infusions:   LOS: 1 day    Time spent: 30 minutes    Chaitra Mast Darleen Crocker, DO [facility] Pager [phone]  If 7PM-7AM, please contact night-coverage [email or web address] Password [facility] 09/26/2018, 11:24 AM

## 2018-09-27 LAB — CULTURE, BLOOD (ROUTINE X 2)
Culture: NO GROWTH
Culture: NO GROWTH
Special Requests: ADEQUATE
Special Requests: ADEQUATE

## 2018-09-27 LAB — BASIC METABOLIC PANEL
Anion gap: 10 (ref 5–15)
BUN: 10 mg/dL (ref 8–23)
CO2: 23 mmol/L (ref 22–32)
Calcium: 7.9 mg/dL — ABNORMAL LOW (ref 8.9–10.3)
Chloride: 102 mmol/L (ref 98–111)
Creatinine, Ser: 0.67 mg/dL (ref 0.61–1.24)
GFR calc Af Amer: >60 mL/min (ref >60)
GFR calc non Af Amer: >60 mL/min (ref >60)
Glucose, Bld: 113 mg/dL — ABNORMAL HIGH (ref 70–99)
Potassium: 3.9 mmol/L (ref 3.5–5.1)
Sodium: 135 mmol/L (ref 135–145)

## 2018-09-27 LAB — CBC
HCT: 35.1 % — ABNORMAL LOW (ref 39.0–52.0)
Hemoglobin: 11.8 g/dL — ABNORMAL LOW (ref 13.0–17.0)
MCH: 33.2 pg (ref 26.0–34.0)
MCHC: 33.6 g/dL (ref 30.0–36.0)
MCV: 98.9 fL (ref 80.0–100.0)
Platelets: 217 10*3/uL (ref 150–400)
RBC: 3.55 MIL/uL — ABNORMAL LOW (ref 4.22–5.81)
RDW: 13.8 % (ref 11.5–15.5)
WBC: 7.1 10*3/uL (ref 4.0–10.5)
nRBC: 0 % (ref 0.0–0.2)

## 2018-09-27 LAB — GLUCOSE, CAPILLARY
Glucose-Capillary: 116 mg/dL — ABNORMAL HIGH (ref 70–99)
Glucose-Capillary: 149 mg/dL — ABNORMAL HIGH (ref 70–99)
Glucose-Capillary: 98 mg/dL (ref 70–99)
Glucose-Capillary: 102 mg/dL — ABNORMAL HIGH (ref 70–99)

## 2018-09-27 LAB — HEPARIN LEVEL (UNFRACTIONATED): Heparin Unfractionated: 0.65 IU/mL (ref 0.30–0.70)

## 2018-09-27 NOTE — Care Management Important Message (Signed)
Important Message  Patient Details  Name: Charles [name] MRN: [id number] Date of Birth: 21-Apr-1951   Medicare Important Message Given:  Yes    Tommy Medal 09/27/2018, 12:03 PM

## 2018-09-27 NOTE — Progress Notes (Signed)
PROGRESS NOTE    Charles [name]  QBH:[id number] DOB: [birth year] DOA: 09/22/2018 PCP: [name], MD   Brief Narrative:  Per HPI: Charles Belton Dickersonis a 68 y.o.malewith medical history significant ofhistory of anxiety, depression, type 2 diabetes, GERD, history of high output ileostomy, history of urolithiasis, hypertension, history of colorectal cancer, history of proctectomy, radiation therapy and chemotherapy (last cycle about 6 weeks ago), history of multiple episodes of pneumonia, DVTs, history of pulmonary embolism on Xarelto who is coming to the emergency department due to persistent diarrhea for the past 4 days.He states that for the past 4 days he has been having of 8-12 episodes of loose stools daily associated with mild RUQ pain. The patient states for the past few days every time he tries to eat something area usually resolves in a loose stool bowel movement. He had an episode of emesis earlier today. He denies melena, hematochezia, flank pain, dysuria or frequency, but states that his urine is darker and he has been urinating less often. He has not been febrile until he arrived to the telemetry floor. He states he has an occipital headache, has not been able to eat or sleep properly as well. He has positional dizziness and complaints of occasional pleuritic chest pain, which he thought may be due to his IVC filter placement. However, the patient has fibrosis on the lungs and the Port-A-Cath catheter. He denies rhinorrhea, sore throat, wheezing, hemoptysis, palpitations, diaphoresis, PND, orthopnea, but state he occasionally gets lower extremity edema. No polyuria, polydipsia, polyphagia or blurred vision.  Patient was admitted with diarrhea in the setting of immunocompromise with ongoing chemotherapy. He was also noted to have a dilated gallbladder.He has C. difficile study has returned negative and GI panel returned positive for enteropathogenic E. coli with  recommendations not to treat with antibiotics at this point. He has undergone flex sigmoidoscopy on 5/22 with findings of stenotic anastomosis and possible need for diverting colostomy soon.General surgery consulted by GI on 5/23.  Dr. Arnoldo Morale has evaluated patient this a.m. and patient is agreeable to surgery which will be performed on 5/27.  Will start heparin drip in the meantime.   Assessment & Plan:   Principal Problem:   Diarrhea in adult patient Active Problems:   GERD   Peripheral neuropathy due to oxaliplatin-chemotherapy   Diarrhea   DVT (deep venous thrombosis) (HCC)   Colon cancer (HCC)   History of pulmonary embolism   Hypokalemia   Type 2 diabetes mellitus (HCC)   Hypomagnesemia   Dilated gallbladder   Abnormal CT scan, colon   Anastomotic stricture of colorectal region   Diarrhea inimmunocompromised patientwith EPEC infection and stenotic anastomosis noted on flex sig 5/22 Recommendations for no antibiotics at this time Plan for diverting colostomy on 5/27 Heparin drip in the meantime Continue current diet with plans to transition to clear liquids by a.m.  Active Problems: Dilated gallbladder-incidental finding Antiemetics as needed. Analgesics as needed. No signs of acute cholecystitis noted, no LFT elevation noted  Type 2 diabetes mellitus (HCC) Blood glucose stable Hold Amaryl for now CBG monitoring with sensitive sliding scale.  Colon cancer [facility]) Follow-up with oncology as scheduledwith Dr. [name]  Hypokalemia-repleted Recheck BMP in a.m.  Hypomagnesemia Resolved  GERD Continue PPI.  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 twice daily.  DVT (deep venous thrombosis) (HCC) History of pulmonary embolism Start heparin drip with anticipated surgery on 5/27   DVT prophylaxis:Heparin drip Code Status:Full Family Communication:Discussed with wife on  5/23 Disposition Plan:Per general surgery with anticipated procedure on 5/27   Consultants:  GI  GS  Procedures:  None  Antimicrobials:   None  Subjective: Patient seen and evaluated today with no new acute complaints or concerns. No acute concerns or events noted overnight.  He states that his diarrhea is better this morning.  He has slept well overnight.  Objective: Vitals:   09/26/18 2053 09/26/18 2140 09/27/18 0609 09/27/18 0839  BP:  (!) 149/85 (!) 144/83   Pulse: 93 90 98   Resp: 16 18 18    Temp:  99.9 F (37.7 C) 98.6 F (37 C)   TempSrc:  Oral Oral   SpO2: 93%  98% 97%  Weight:      Height:        Intake/Output Summary (Last 24 hours) at 09/27/2018 1124 Last data filed at 09/27/2018 0900 Gross per 24 hour  Intake 776.27 ml  Output 1575 ml  Net -798.73 ml   Filed Weights   09/22/18 1509 09/22/18 1658 09/26/18 1100  Weight: 39.8 kg 87.5 kg 87.5 kg    Examination:  General exam: Appears calm and comfortable  Respiratory system: Clear to auscultation. Respiratory effort normal. Cardiovascular system: S1 & S2 heard, RRR. No JVD, murmurs, rubs, gallops or clicks. No pedal edema. Gastrointestinal system: Abdomen is nondistended, soft and nontender. No organomegaly or masses felt. Normal bowel sounds heard. Central nervous system: Alert and oriented. No focal neurological deficits. Extremities: Symmetric 5 x 5 power. Skin: No rashes, lesions or ulcers Psychiatry: Judgement and insight appear normal. Mood & affect appropriate.     Data Reviewed: I have personally reviewed following labs and imaging studies  CBC: Recent Labs  Lab 09/22/18 1529 09/23/18 0552 09/24/18 0500 09/25/18 0705 09/26/18 0421 09/27/18 0430  WBC 9.0 10.7* 13.8* 10.2 7.7 7.1  NEUTROABS 7.3  --   --   --   --   --   HGB 13.6 12.6* 11.9* 11.7* 11.4* 11.8*  HCT 40.6 38.4* 36.0* 35.4* 34.2* 35.1*  MCV 99.3 101.1* 100.3* 99.7 99.1 98.9  PLT 236 225 194 184 211 767    Basic Metabolic Panel: Recent Labs  Lab 09/22/18 1529 09/23/18 0552 09/24/18 0500 09/25/18 0705 09/26/18 0421 09/27/18 0430  NA 138 139 134* 134* 134* 135  K 3.4* 3.8 3.7 3.3* 3.9 3.9  CL 106 107 103 100 102 102  CO2 20* 22 24 24 24 23   GLUCOSE 203* 159* 178* 94 116* 113*  BUN 9 7* 11 12 10 10   CREATININE 0.66 0.62 0.68 0.64 0.65 0.67  CALCIUM 8.7* 8.1* 8.1* 7.9* 7.8* 7.9*  MG 1.5*  --  1.6* 1.7 1.7  --   PHOS 3.4  --   --   --   --   --    GFR: Estimated Creatinine Clearance: 102.8 mL/min (by C-G formula based on SCr of 0.67 mg/dL). Liver Function Tests: Recent Labs  Lab 09/20/18 1218 09/22/18 1529 09/23/18 0552  AST 32 29 22  ALT 78* 50* 38  ALKPHOS 271* 237* 191*  BILITOT 2.3* 2.3* 2.7*  PROT 6.5 6.2* 5.5*  ALBUMIN 3.4* 3.4* 2.8*   Recent Labs  Lab 09/22/18 1529  LIPASE 17   No results for input(s): AMMONIA in the last 168 hours. Coagulation Profile: No results for input(s): INR, PROTIME in the last 168 hours. Cardiac Enzymes: No results for input(s): CKTOTAL, CKMB, CKMBINDEX, TROPONINI in the last 168 hours. BNP (last 3 results) No results for input(s): PROBNP in the  last 8760 hours. HbA1C: No results for input(s): HGBA1C in the last 72 hours. CBG: Recent Labs  Lab 09/26/18 1105 09/26/18 1608 09/26/18 2144 09/27/18 0714 09/27/18 1116  GLUCAP 141* 102* 111* 116* 149*   Lipid Profile: No results for input(s): CHOL, HDL, LDLCALC, TRIG, CHOLHDL, LDLDIRECT in the last 72 hours. Thyroid Function Tests: No results for input(s): TSH, T4TOTAL, FREET4, T3FREE, THYROIDAB in the last 72 hours. Anemia Panel: No results for input(s): VITAMINB12, FOLATE, FERRITIN, TIBC, IRON, RETICCTPCT in the last 72 hours. Sepsis Labs: Recent Labs  Lab 09/22/18 2339  LATICACIDVEN 1.4    Recent Results (from the past 240 hour(s))  Gastrointestinal Panel by PCR , Stool     Status: Abnormal   Collection Time: 09/22/18  4:04 PM  Result Value Ref Range Status    Campylobacter species NOT DETECTED NOT DETECTED Final   Plesimonas shigelloides NOT DETECTED NOT DETECTED Final   Salmonella species NOT DETECTED NOT DETECTED Final   Yersinia enterocolitica NOT DETECTED NOT DETECTED Final   Vibrio species NOT DETECTED NOT DETECTED Final   Vibrio cholerae NOT DETECTED NOT DETECTED Final   Enteroaggregative E coli (EAEC) NOT DETECTED NOT DETECTED Final   Enteropathogenic E coli (EPEC) DETECTED (A) NOT DETECTED Final    Comment: CRITICAL RESULT CALLED TO, READ BACK BY AND VERIFIED WITH: CALLED TO DANA COX @1442  09/23/2018 SAC    Enterotoxigenic E coli (ETEC) NOT DETECTED NOT DETECTED Final   Shiga like toxin producing E coli (STEC) NOT DETECTED NOT DETECTED Final   Shigella/Enteroinvasive E coli (EIEC) NOT DETECTED NOT DETECTED Final   Cryptosporidium NOT DETECTED NOT DETECTED Final   Cyclospora cayetanensis NOT DETECTED NOT DETECTED Final   Entamoeba histolytica NOT DETECTED NOT DETECTED Final   Giardia lamblia NOT DETECTED NOT DETECTED Final   Adenovirus F40/41 NOT DETECTED NOT DETECTED Final   Astrovirus NOT DETECTED NOT DETECTED Final   Norovirus GI/GII NOT DETECTED NOT DETECTED Final   Rotavirus A NOT DETECTED NOT DETECTED Final   Sapovirus (I, II, IV, and V) NOT DETECTED NOT DETECTED Final    Comment: Performed at [facility], [address] [place], [place] [address]  C difficile quick scan w PCR reflex     Status: None   Collection Time: 09/22/18  4:05 PM  Result Value Ref Range Status   C Diff antigen NEGATIVE NEGATIVE Final   C Diff toxin NEGATIVE NEGATIVE Final   C Diff interpretation No C. difficile detected.  Final    Comment: VALID Performed at [facility], [address] [place], [place] [address]   SARS Coronavirus 2 (CEPHEID - Performed in [place] hospital lab), Hosp Order     Status: None   Collection Time: 09/22/18  7:46 PM  Result Value Ref Range Status   SARS Coronavirus 2 NEGATIVE NEGATIVE Final    Comment:  (NOTE) If result is NEGATIVE SARS-CoV-2 target nucleic acids are NOT DETECTED. The SARS-CoV-2 RNA is generally detectable in upper and lower  respiratory specimens during the acute phase of infection. The lowest  concentration of SARS-CoV-2 viral copies this assay can detect is 250  copies / mL. A negative result does not preclude SARS-CoV-2 infection  and should not be used as the sole basis for treatment or other  patient management decisions.  A negative result may occur with  improper specimen collection / handling, submission of specimen other  than nasopharyngeal swab, presence of viral mutation(s) within the  areas targeted by this assay, and inadequate  number of viral copies  (<250 copies / mL). A negative result must be combined with clinical  observations, patient history, and epidemiological information. If result is POSITIVE SARS-CoV-2 target nucleic acids are DETECTED. The SARS-CoV-2 RNA is generally detectable in upper and lower  respiratory specimens dur ing the acute phase of infection.  Positive  results are indicative of active infection with SARS-CoV-2.  Clinical  correlation with patient history and other diagnostic information is  necessary to determine patient infection status.  Positive results do  not rule out bacterial infection or co-infection with other viruses. If result is PRESUMPTIVE POSTIVE SARS-CoV-2 nucleic acids MAY BE PRESENT.   A presumptive positive result was obtained on the submitted specimen  and confirmed on repeat testing.  While 2019 novel coronavirus  (SARS-CoV-2) nucleic acids may be present in the submitted sample  additional confirmatory testing may be necessary for epidemiological  and / or clinical management purposes  to differentiate between  SARS-CoV-2 and other Sarbecovirus currently known to infect humans.  If clinically indicated additional testing with an alternate test  methodology [phone]) is advised. The SARS-CoV-2 RNA is  generally  detectable in upper and lower respiratory sp ecimens during the acute  phase of infection. The expected result is Negative. Fact Sheet for Patients:  StrictlyIdeas.no Fact Sheet for Healthcare Providers: BankingDealers.co.za This test is not yet approved or cleared by the [place] FDA and has been authorized for detection and/or diagnosis of SARS-CoV-2 by FDA under an Emergency Use Authorization (EUA).  This EUA will remain in effect (meaning this test can be used) for the duration of the COVID-19 declaration under Section 564(b)(1) of the Act, 21 U.S.C. section 360bbb-3(b)(1), unless the authorization is terminated or revoked sooner. Performed at [facility], [address] [place], [place] [address]   Culture, blood (routine x 2)     Status: None   Collection Time: 09/22/18 11:32 PM  Result Value Ref Range Status   Specimen Description BLOOD RIGHT ANTECUBITAL  Final   Special Requests   Final    BOTTLES DRAWN AEROBIC AND ANAEROBIC Blood Culture adequate volume   Culture   Final    NO GROWTH 5 DAYS Performed at [facility], [address], [place], [place] [address]    Report Status 09/27/2018 FINAL  Final  Culture, blood (routine x 2)     Status: None   Collection Time: 09/22/18 11:39 PM  Result Value Ref Range Status   Specimen Description BLOOD RIGHT HAND  Final   Special Requests   Final    BOTTLES DRAWN AEROBIC AND ANAEROBIC Blood Culture adequate volume   Culture   Final    NO GROWTH 5 DAYS Performed at [facility], [address] [place], [place] [address]    Report Status 09/27/2018 FINAL  Final  Culture, blood (routine x 2)     Status: None (Preliminary result)   Collection Time: 09/25/18  4:16 PM  Result Value Ref Range Status   Specimen Description RIGHT ANTECUBITAL  Final   Special Requests   Final    BOTTLES DRAWN AEROBIC AND ANAEROBIC Blood Culture adequate volume   Culture   Final    NO  GROWTH 2 DAYS Performed at [facility], [address], [place], [place] [address]    Report Status PENDING  Incomplete  Culture, blood (routine x 2)     Status: None (Preliminary result)   Collection Time: 09/25/18  4:16 PM  Result Value Ref Range Status   Specimen  Description BLOOD RIGHT ARM  Final   Special Requests   Final    BOTTLES DRAWN AEROBIC AND ANAEROBIC Blood Culture adequate volume   Culture   Final    NO GROWTH 2 DAYS Performed at [facility], [address] [place], [place] [address]    Report Status PENDING  Incomplete         Radiology Studies: Dg Chest Port 1 View  Result Date: 09/25/2018 CLINICAL DATA:  Diarrhea.  History of cancer and diabetes. EXAM: PORTABLE CHEST 1 VIEW COMPARISON:  Radiographs 09/22/2018.  CT 07/30/2018. FINDINGS: 1608 hours. Mild patient rotation to the right. Right subclavian Port-A-Cath appears unchanged at the lower SVC level. The heart size and mediastinal contours are stable. There is stable opacity at the right lung base and mild right lung nodularity. The left lung is clear. There is no pleural effusion or pneumothorax. IMPRESSION: Stable chest without acute findings. Electronically Signed   By: Richardean Sale M.D.   On: 09/25/2018 16:29        Scheduled Meds:  diphenoxylate-atropine  1 tablet Oral BID WC   feeding supplement (GLUCERNA SHAKE)  237 mL Oral TID BM   gabapentin  900 mg Oral BID   insulin aspart  0-9 Units Subcutaneous TID WC   ondansetron  4 mg Intravenous Once   pantoprazole  40 mg Oral Daily   traZODone  100 mg Oral QHS   Continuous Infusions:  heparin 1,300 Units/hr (09/27/18 0253)     LOS: 2 days    Time spent: 30 minutes    Bufford Helms Darleen Crocker, DO [facility] Pager [phone]  If 7PM-7AM, please contact night-coverage [email or web address] Password [facility] 09/27/2018, 11:24 AM

## 2018-09-27 NOTE — Progress Notes (Signed)
3 Days Post-Op  Subjective: No complaints  Objective: Vital signs in last 24 hours: Temp:  [98.4 F (36.9 C)-99.9 F (37.7 C)] 98.6 F (37 C) (05/25 0609) Pulse Rate:  [90-104] 98 (05/25 0609) Resp:  [16-18] 18 (05/25 0609) BP: (144-149)/(83-88) 144/83 (05/25 0609) SpO2:  [93 %-98 %] 98 % (05/25 0609) Weight:  [87.5 kg] 87.5 kg (05/24 1100) Last BM Date: 09/26/18  Intake/Output from previous day: 05/24 0701 - 05/25 0700 In: 776.3 [P.O.:720; I.V.:56.3] Out: 1675 [Urine:1675] Intake/Output this shift: No intake/output data recorded.  General appearance: alert, cooperative and no distress GI: soft, non-tender; bowel sounds normal; no masses,  no organomegaly  Lab Results:  Recent Labs    09/26/18 0421 09/27/18 0430  WBC 7.7 7.1  HGB 11.4* 11.8*  HCT 34.2* 35.1*  PLT 211 217   BMET Recent Labs    09/26/18 0421 09/27/18 0430  NA 134* 135  K 3.9 3.9  CL 102 102  CO2 24 23  GLUCOSE 116* 113*  BUN 10 10  CREATININE 0.65 0.67  CALCIUM 7.8* 7.9*   PT/INR No results for input(s): LABPROT, INR in the last 72 hours.  Studies/Results: Dg Chest Port 1 View  Result Date: 09/25/2018 CLINICAL DATA:  Diarrhea.  History of cancer and diabetes. EXAM: PORTABLE CHEST 1 VIEW COMPARISON:  Radiographs 09/22/2018.  CT 07/30/2018. FINDINGS: 1608 hours. Mild patient rotation to the right. Right subclavian Port-A-Cath appears unchanged at the lower SVC level. The heart size and mediastinal contours are stable. There is stable opacity at the right lung base and mild right lung nodularity. The left lung is clear. There is no pleural effusion or pneumothorax. IMPRESSION: Stable chest without acute findings. Electronically Signed   By: Richardean Sale M.D.   On: 09/25/2018 16:29    Anti-infectives: Anti-infectives (From admission, onward)   None      Assessment/Plan: Impression: Anastomotic stricture of colorectal anastomosis. Plan: We will start clear liquid diet tomorrow.   Transverse loop colostomy scheduled for 09/29/2018.  LOS: 2 days    Aviva Signs 09/27/2018

## 2018-09-27 NOTE — Progress Notes (Signed)
ANTICOAGULATION CONSULT NOTE - Initial Consult  Pharmacy Consult for heparin dosing Indication: hx VTE  Allergies  Allergen Reactions  . Oxycodone     Blisters, hallucinations  . Tramadol     Blisters, hallucinations  . Trazodone And Nefazodone Other (See Comments)    hallucinations    Patient Measurements: Height: 6\' 2"  (188 cm) Weight: 192 lb 14.4 oz (87.5 kg) IBW/kg (Calculated) : 82.2 Heparin Dosing Weight: HEPARIN DW (KG): 87.5   Vital Signs: Temp: 98.6 F (37 C) (05/25 0609) Temp Source: Oral (05/25 0609) BP: 144/83 (05/25 0609) Pulse Rate: 98 (05/25 0609)  Labs: Recent Labs    09/25/18 0705 09/26/18 0421 09/26/18 1108 09/26/18 1918 09/27/18 0430  HGB 11.7* 11.4*  --   --  11.8*  HCT 35.4* 34.2*  --   --  35.1*  PLT 184 211  --   --  217  APTT  --   --  52*  --   --   HEPARINUNFRC  --   --  0.41 0.81* 0.65  CREATININE 0.64 0.65  --   --  0.67    Estimated Creatinine Clearance: 102.8 mL/min (by C-G formula based on SCr of 0.67 mg/dL).   Medical History: Past Medical History:  Diagnosis Date  . Chronic anticoagulation   . Colon cancer ([place]) 07/24/2010   rectal ca, inv adenocarcinoma  . Depression 04/01/2011  . Diabetes mellitus without complication ([place])   . DVT (deep venous thrombosis) ([place]) 05/09/2011  . GERD (gastroesophageal reflux disease)   . High output ileostomy ([place]) 05/21/2011  . History of kidney stones   . HTN (hypertension)   . Hx of radiation therapy 09/02/10 to 10/14/10   pelvis  . Lung metastasis ([place])   . Neuropathy   . Peripheral vascular disease ([place])    dvt's,pe  . Pneumonia    hx x3  . Pulmonary embolism ([place])   . Rectal cancer ([place]) 01/15/2011   S/P radiation and concurrent 5-FU continuous infusion from 09/09/10- 10/10/10.  S/P proctectomy with colorectal anastomosis and diverting loop ileostomy on 11/14/10 at [facility] by Dr. Harlon Ditty. Pathology reveals a pT3b N1 with 3/20 lymph nodes.      Assessment: Pharmacy consulted to  dose heparin  for this 68 yo male with metastatic rectal carcinoma and history of VTE.  Patient has been on rivaroxaban and received his last dose at 1642 on 09/25/18.  Goal of Therapy:  Heparin level 0.3-0.7 units/ml Monitor platelets by anticoagulation protocol: Yes   Plan:   Continue  heparin infusion at  1300  units/hr for heparin level of 0.65 IU/mL this morning. Check anti-Xa level daily while on heparin Continue to monitor H&H and platelets  Despina Pole 09/27/2018,12:04 PM

## 2018-09-28 ENCOUNTER — Encounter (HOSPITAL_COMMUNITY): Payer: Self-pay | Admitting: Gastroenterology

## 2018-09-28 LAB — GLUCOSE, CAPILLARY
Glucose-Capillary: 102 mg/dL — ABNORMAL HIGH (ref 70–99)
Glucose-Capillary: 150 mg/dL — ABNORMAL HIGH (ref 70–99)
Glucose-Capillary: 97 mg/dL (ref 70–99)
Glucose-Capillary: 149 mg/dL — ABNORMAL HIGH (ref 70–99)

## 2018-09-28 LAB — SURGICAL PCR SCREEN
MRSA, PCR: NEGATIVE
Staphylococcus aureus: POSITIVE — AB

## 2018-09-28 LAB — BASIC METABOLIC PANEL
Anion gap: 10 (ref 5–15)
BUN: 9 mg/dL (ref 8–23)
CO2: 24 mmol/L (ref 22–32)
Calcium: 8.2 mg/dL — ABNORMAL LOW (ref 8.9–10.3)
Chloride: 101 mmol/L (ref 98–111)
Creatinine, Ser: 0.68 mg/dL (ref 0.61–1.24)
GFR calc Af Amer: >60 mL/min (ref >60)
GFR calc non Af Amer: >60 mL/min (ref >60)
Glucose, Bld: 117 mg/dL — ABNORMAL HIGH (ref 70–99)
Potassium: 4.3 mmol/L (ref 3.5–5.1)
Sodium: 135 mmol/L (ref 135–145)

## 2018-09-28 LAB — HEPARIN LEVEL (UNFRACTIONATED)
Heparin Unfractionated: 1.06 IU/mL — ABNORMAL HIGH (ref 0.30–0.70)
Heparin Unfractionated: 0.62 IU/mL (ref 0.30–0.70)

## 2018-09-28 MED ORDER — HEPARIN (PORCINE) 25000 UT/250ML-% IV SOLN
1000.0000 [IU]/h | INTRAVENOUS | Status: DC
  Administered 2018-09-28 (×2): 1000 [IU]/h via INTRAVENOUS
  Filled 2018-09-28: qty 250

## 2018-09-28 MED ORDER — SODIUM CHLORIDE 0.9 % IV SOLN
1.0000 g | INTRAVENOUS | Status: AC
  Administered 2018-09-29: 1 g via INTRAVENOUS
  Filled 2018-09-28: qty 1

## 2018-09-28 MED ORDER — CHLORHEXIDINE GLUCONATE CLOTH 2 % EX PADS
6.0000 | MEDICATED_PAD | Freq: Once | CUTANEOUS | Status: AC
  Administered 2018-09-29: 6 via TOPICAL

## 2018-09-28 MED ORDER — CHLORHEXIDINE GLUCONATE CLOTH 2 % EX PADS
6.0000 | MEDICATED_PAD | Freq: Once | CUTANEOUS | Status: AC
  Administered 2018-09-28: 6 via TOPICAL

## 2018-09-28 MED ORDER — MUPIROCIN 2 % EX OINT
1.0000 "application " | TOPICAL_OINTMENT | Freq: Two times a day (BID) | CUTANEOUS | Status: DC
  Administered 2018-09-28 – 2018-10-01 (×4): 1 via NASAL
  Filled 2018-09-28: qty 22

## 2018-09-28 MED ORDER — CHLORHEXIDINE GLUCONATE CLOTH 2 % EX PADS
6.0000 | MEDICATED_PAD | Freq: Every day | CUTANEOUS | Status: DC
  Administered 2018-09-30: 6 via TOPICAL

## 2018-09-28 NOTE — Progress Notes (Signed)
4 Days Post-Op  Subjective: No complaints.  Objective: Vital signs in last 24 hours: Temp:  [98.2 F (36.8 C)-98.5 F (36.9 C)] 98.5 F (36.9 C) (05/26 0645) Pulse Rate:  [87-98] 98 (05/26 0645) Resp:  [16-19] 17 (05/26 0645) BP: (141-158)/(84-88) 158/84 (05/26 0645) SpO2:  [97 %-98 %] 98 % (05/26 0645) Last BM Date: 09/26/18  Intake/Output from previous day: 05/25 0701 - 05/26 0700 In: 1586.9 [P.O.:1080; I.V.:506.9] Out: 1800 [Urine:1800] Intake/Output this shift: No intake/output data recorded.  General appearance: alert, cooperative and no distress GI: Soft, nontender, nondistended.  Lab Results:  Recent Labs    09/26/18 0421 09/27/18 0430  WBC 7.7 7.1  HGB 11.4* 11.8*  HCT 34.2* 35.1*  PLT 211 217   BMET Recent Labs    09/27/18 0430 09/28/18 0653  NA 135 135  K 3.9 4.3  CL 102 101  CO2 23 24  GLUCOSE 113* 117*  BUN 10 9  CREATININE 0.67 0.68  CALCIUM 7.9* 8.2*   PT/INR No results for input(s): LABPROT, INR in the last 72 hours.  Studies/Results: No results found.  Anti-infectives: Anti-infectives (From admission, onward)   None      Assessment/Plan: Impression: Metastatic colorectal carcinoma with anastomotic stricture Plan: Will proceed with transverse loop colostomy tomorrow.  The risks and benefits of the procedure including bleeding, infection, and cardiopulmonary difficulties were fully explained to the patient, who gave informed consent.  This is a palliative procedure.  Enterostomal therapy nurse will see the patient today to preop [place] Doell the abdomen.  LOS: 3 days    Aviva Signs 09/28/2018

## 2018-09-28 NOTE — Progress Notes (Signed)
PROGRESS NOTE    Charles [name]  CXK:[id number] DOB: August 11, 1950 DOA: 09/22/2018 PCP: Charles Chroman, MD   Brief Narrative:  Per HPI: Charles Belton Dickersonis a 68 y.o.malewith medical history significant ofhistory of anxiety, depression, type 2 diabetes, GERD, history of high output ileostomy, history of urolithiasis, hypertension, history of colorectal cancer, history of proctectomy, radiation therapy and chemotherapy (last cycle about 6 weeks ago), history of multiple episodes of pneumonia, DVTs, history of pulmonary embolism on Xarelto who is coming to the emergency department due to persistent diarrhea for the past 4 days.He states that for the past 4 days he has been having of 8-12 episodes of loose stools daily associated with mild RUQ pain. The patient states for the past few days every time he tries to eat something area usually resolves in a loose stool bowel movement. He had an episode of emesis earlier today. He denies melena, hematochezia, flank pain, dysuria or frequency, but states that his urine is darker and he has been urinating less often. He has not been febrile until he arrived to the telemetry floor. He states he has an occipital headache, has not been able to eat or sleep properly as well. He has positional dizziness and complaints of occasional pleuritic chest pain, which he thought may be due to his IVC filter placement. However, the patient has fibrosis on the lungs and the Port-A-Cath catheter. He denies rhinorrhea, sore throat, wheezing, hemoptysis, palpitations, diaphoresis, PND, orthopnea, but state he occasionally gets lower extremity edema. No polyuria, polydipsia, polyphagia or blurred vision.  Patient was admitted with diarrhea in the setting of immunocompromise with ongoing chemotherapy. He was also noted to have a dilated gallbladder.He has C. difficile study has returned negative and GI panel returned positive for enteropathogenic E. coli with  recommendations not to treat with antibiotics at this point. He has undergone flex sigmoidoscopy on 5/22 with findings of stenotic anastomosis and possible need for diverting colostomy soon.General surgery consulted by GI on 5/23.Dr. Arnoldo Morale has evaluated patient this a.m. and patient is agreeable to surgery which will be performed on 5/27. Will start heparin drip in the meantime.   Assessment & Plan:   Principal Problem:   Diarrhea in adult patient Active Problems:   GERD   Peripheral neuropathy due to oxaliplatin-chemotherapy   Diarrhea   DVT (deep venous thrombosis) (HCC)   Colon cancer (HCC)   History of pulmonary embolism   Hypokalemia   Type 2 diabetes mellitus (HCC)   Hypomagnesemia   Dilated gallbladder   Abnormal CT scan, colon   Anastomotic stricture of colorectal region  Diarrhea inimmunocompromised patientwith EPEC infection and stenotic anastomosis noted on flex sig 5/22 Recommendations for no antibiotics at this time Plan for diverting colostomy on 5/27 Heparin drip in the meantime Continue current diet with NPO after midnight  Active Problems: Dilated gallbladder-incidental finding Antiemetics as needed. Analgesics as needed. No signs of acute cholecystitis noted, no LFT elevation noted  Type 2 diabetes mellitus (HCC) Blood glucose stable Hold Amaryl for now CBG monitoring with sensitive sliding scale.  Colon cancer [facility]) Follow-up with oncology as scheduledwith Dr. [name]  Hypokalemia-repleted Recheck BMP in a.m.  Hypomagnesemia Resolved  GERD Continue PPI.  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 twice daily.  DVT (deep venous thrombosis) (HCC) History of pulmonary embolism Start heparin drip with anticipated surgery on 5/27   DVT prophylaxis:Heparin drip Code Status:Full Family Communication:Discussed with wife on 5/23 Disposition Plan:Per general  surgery with anticipated procedure  on 5/27   Consultants:  GI  GS  Procedures:  None  Antimicrobials:   None  Subjective: Patient seen and evaluated today with ongoing diarrhea noted overnight.  No other new complaints noted.  Objective: Vitals:   09/27/18 0839 09/27/18 1405 09/27/18 2231 09/28/18 0645  BP:  (!) 141/85 (!) 147/88 (!) 158/84  Pulse:  87 90 98  Resp:  19 16 17   Temp:  98.2 F (36.8 C) 98.5 F (36.9 C) 98.5 F (36.9 C)  TempSrc:  Oral Oral Oral  SpO2: 97% 97% 97% 98%  Weight:      Height:        Intake/Output Summary (Last 24 hours) at 09/28/2018 1114 Last data filed at 09/28/2018 0900 Gross per 24 hour  Intake 1706.91 ml  Output 1600 ml  Net 106.91 ml   Filed Weights   09/22/18 1509 09/22/18 1658 09/26/18 1100  Weight: 39.8 kg 87.5 kg 87.5 kg    Examination:  General exam: Appears calm and comfortable  Respiratory system: Clear to auscultation. Respiratory effort normal. Cardiovascular system: S1 & S2 heard, RRR. No JVD, murmurs, rubs, gallops or clicks. No pedal edema. Gastrointestinal system: Abdomen is nondistended, soft and nontender. No organomegaly or masses felt. Normal bowel sounds heard. Central nervous system: Alert and oriented. No focal neurological deficits. Extremities: Symmetric 5 x 5 power. Skin: No rashes, lesions or ulcers Psychiatry: Judgement and insight appear normal. Mood & affect appropriate.     Data Reviewed: I have personally reviewed following labs and imaging studies  CBC: Recent Labs  Lab 09/22/18 1529 09/23/18 0552 09/24/18 0500 09/25/18 0705 09/26/18 0421 09/27/18 0430  WBC 9.0 10.7* 13.8* 10.2 7.7 7.1  NEUTROABS 7.3  --   --   --   --   --   HGB 13.6 12.6* 11.9* 11.7* 11.4* 11.8*  HCT 40.6 38.4* 36.0* 35.4* 34.2* 35.1*  MCV 99.3 101.1* 100.3* 99.7 99.1 98.9  PLT 236 225 194 184 211 607   Basic Metabolic Panel: Recent Labs  Lab 09/22/18 1529  09/24/18 0500 09/25/18 0705 09/26/18 0421  09/27/18 0430 09/28/18 0653  NA 138   < > 134* 134* 134* 135 135  K 3.4*   < > 3.7 3.3* 3.9 3.9 4.3  CL 106   < > 103 100 102 102 101  CO2 20*   < > 24 24 24 23 24   GLUCOSE 203*   < > 178* 94 116* 113* 117*  BUN 9   < > 11 12 10 10 9   CREATININE 0.66   < > 0.68 0.64 0.65 0.67 0.68  CALCIUM 8.7*   < > 8.1* 7.9* 7.8* 7.9* 8.2*  MG 1.5*  --  1.6* 1.7 1.7  --   --   PHOS 3.4  --   --   --   --   --   --    < > = values in this interval not displayed.   GFR: Estimated Creatinine Clearance: 102.8 mL/min (by C-G formula based on SCr of 0.68 mg/dL). Liver Function Tests: Recent Labs  Lab 09/22/18 1529 09/23/18 0552  AST 29 22  ALT 50* 38  ALKPHOS 237* 191*  BILITOT 2.3* 2.7*  PROT 6.2* 5.5*  ALBUMIN 3.4* 2.8*   Recent Labs  Lab 09/22/18 1529  LIPASE 17   No results for input(s): AMMONIA in the last 168 hours. Coagulation Profile: No results for input(s): INR, PROTIME in the last 168 hours. Cardiac Enzymes: No results for input(s): CKTOTAL,  CKMB, CKMBINDEX, TROPONINI in the last 168 hours. BNP (last 3 results) No results for input(s): PROBNP in the last 8760 hours. HbA1C: No results for input(s): HGBA1C in the last 72 hours. CBG: Recent Labs  Lab 09/27/18 0714 09/27/18 1116 09/27/18 1600 09/27/18 2234 09/28/18 0741  GLUCAP 116* 149* 98 102* 102*   Lipid Profile: No results for input(s): CHOL, HDL, LDLCALC, TRIG, CHOLHDL, LDLDIRECT in the last 72 hours. Thyroid Function Tests: No results for input(s): TSH, T4TOTAL, FREET4, T3FREE, THYROIDAB in the last 72 hours. Anemia Panel: No results for input(s): VITAMINB12, FOLATE, FERRITIN, TIBC, IRON, RETICCTPCT in the last 72 hours. Sepsis Labs: Recent Labs  Lab 09/22/18 2339  LATICACIDVEN 1.4    Recent Results (from the past 240 hour(s))  Gastrointestinal Panel by PCR , Stool     Status: Abnormal   Collection Time: 09/22/18  4:04 PM  Result Value Ref Range Status   Campylobacter species NOT DETECTED NOT DETECTED  Final   Plesimonas shigelloides NOT DETECTED NOT DETECTED Final   Salmonella species NOT DETECTED NOT DETECTED Final   Yersinia enterocolitica NOT DETECTED NOT DETECTED Final   Vibrio species NOT DETECTED NOT DETECTED Final   Vibrio cholerae NOT DETECTED NOT DETECTED Final   Enteroaggregative E coli (EAEC) NOT DETECTED NOT DETECTED Final   Enteropathogenic E coli (EPEC) DETECTED (A) NOT DETECTED Final    Comment: CRITICAL RESULT CALLED TO, READ BACK BY AND VERIFIED WITH: CALLED TO DANA COX @1442  09/23/2018 SAC    Enterotoxigenic E coli (ETEC) NOT DETECTED NOT DETECTED Final   Shiga like toxin producing E coli (STEC) NOT DETECTED NOT DETECTED Final   Shigella/Enteroinvasive E coli (EIEC) NOT DETECTED NOT DETECTED Final   Cryptosporidium NOT DETECTED NOT DETECTED Final   Cyclospora cayetanensis NOT DETECTED NOT DETECTED Final   Entamoeba histolytica NOT DETECTED NOT DETECTED Final   Giardia lamblia NOT DETECTED NOT DETECTED Final   Adenovirus F40/41 NOT DETECTED NOT DETECTED Final   Astrovirus NOT DETECTED NOT DETECTED Final   Norovirus GI/GII NOT DETECTED NOT DETECTED Final   Rotavirus A NOT DETECTED NOT DETECTED Final   Sapovirus (I, II, IV, and V) NOT DETECTED NOT DETECTED Final    Comment: Performed at [facility], [address] [place], [place] [address]  C difficile quick scan w PCR reflex     Status: None   Collection Time: 09/22/18  4:05 PM  Result Value Ref Range Status   C Diff antigen NEGATIVE NEGATIVE Final   C Diff toxin NEGATIVE NEGATIVE Final   C Diff interpretation No C. difficile detected.  Final    Comment: VALID Performed at [facility], [address], [place], [place] [address]   SARS Coronavirus 2 (CEPHEID - Performed in [place] hospital lab), Hosp Order     Status: None   Collection Time: 09/22/18  7:46 PM  Result Value Ref Range Status   SARS Coronavirus 2 NEGATIVE NEGATIVE Final    Comment: (NOTE) If result is NEGATIVE SARS-CoV-2 target  nucleic acids are NOT DETECTED. The SARS-CoV-2 RNA is generally detectable in upper and lower  respiratory specimens during the acute phase of infection. The lowest  concentration of SARS-CoV-2 viral copies this assay can detect is 250  copies / mL. A negative result does not preclude SARS-CoV-2 infection  and should not be used as the sole basis for treatment or other  patient management decisions.  A negative result may occur with  improper specimen collection / handling, submission of specimen  other  than nasopharyngeal swab, presence of viral mutation(s) within the  areas targeted by this assay, and inadequate number of viral copies  (<250 copies / mL). A negative result must be combined with clinical  observations, patient history, and epidemiological information. If result is POSITIVE SARS-CoV-2 target nucleic acids are DETECTED. The SARS-CoV-2 RNA is generally detectable in upper and lower  respiratory specimens dur ing the acute phase of infection.  Positive  results are indicative of active infection with SARS-CoV-2.  Clinical  correlation with patient history and other diagnostic information is  necessary to determine patient infection status.  Positive results do  not rule out bacterial infection or co-infection with other viruses. If result is PRESUMPTIVE POSTIVE SARS-CoV-2 nucleic acids MAY BE PRESENT.   A presumptive positive result was obtained on the submitted specimen  and confirmed on repeat testing.  While 2019 novel coronavirus  (SARS-CoV-2) nucleic acids may be present in the submitted sample  additional confirmatory testing may be necessary for epidemiological  and / or clinical management purposes  to differentiate between  SARS-CoV-2 and other Sarbecovirus currently known to infect humans.  If clinically indicated additional testing with an alternate test  methodology [phone]) is advised. The SARS-CoV-2 RNA is generally  detectable in upper and lower  respiratory sp ecimens during the acute  phase of infection. The expected result is Negative. Fact Sheet for Patients:  StrictlyIdeas.no Fact Sheet for Healthcare Providers: BankingDealers.co.za This test is not yet approved or cleared by the [place] FDA and has been authorized for detection and/or diagnosis of SARS-CoV-2 by FDA under an Emergency Use Authorization (EUA).  This EUA will remain in effect (meaning this test can be used) for the duration of the COVID-19 declaration under Section 564(b)(1) of the Act, 21 U.S.C. section 360bbb-3(b)(1), unless the authorization is terminated or revoked sooner. Performed at [facility], [address] [place], [place] [address]   Culture, blood (routine x 2)     Status: None   Collection Time: 09/22/18 11:32 PM  Result Value Ref Range Status   Specimen Description BLOOD RIGHT ANTECUBITAL  Final   Special Requests   Final    BOTTLES DRAWN AEROBIC AND ANAEROBIC Blood Culture adequate volume   Culture   Final    NO GROWTH 5 DAYS Performed at [facility], [address], [place], [place] [address]    Report Status 09/27/2018 FINAL  Final  Culture, blood (routine x 2)     Status: None   Collection Time: 09/22/18 11:39 PM  Result Value Ref Range Status   Specimen Description BLOOD RIGHT HAND  Final   Special Requests   Final    BOTTLES DRAWN AEROBIC AND ANAEROBIC Blood Culture adequate volume   Culture   Final    NO GROWTH 5 DAYS Performed at [facility], [address], [place],  [address]    Report Status 09/27/2018 FINAL  Final  Culture, blood (routine x 2)     Status: None (Preliminary result)   Collection Time: 09/25/18  4:16 PM  Result Value Ref Range Status   Specimen Description RIGHT ANTECUBITAL  Final   Special Requests   Final    BOTTLES DRAWN AEROBIC AND ANAEROBIC Blood Culture adequate volume   Culture   Final    NO GROWTH 3 DAYS Performed at [facility], [address], [place],  [address]    Report Status PENDING  Incomplete  Culture, blood (routine x 2)     Status: None (  Preliminary result)   Collection Time: 09/25/18  4:16 PM  Result Value Ref Range Status   Specimen Description BLOOD RIGHT ARM  Final   Special Requests   Final    BOTTLES DRAWN AEROBIC AND ANAEROBIC Blood Culture adequate volume   Culture   Final    NO GROWTH 3 DAYS Performed at [facility], [address] [place], [place] [address]    Report Status PENDING  Incomplete         Radiology Studies: No results found.      Scheduled Meds: . diphenoxylate-atropine  1 tablet Oral BID WC  . feeding supplement (GLUCERNA SHAKE)  237 mL Oral TID BM  . gabapentin  900 mg Oral BID  . insulin aspart  0-9 Units Subcutaneous TID WC  . ondansetron  4 mg Intravenous Once  . pantoprazole  40 mg Oral Daily  . traZODone  100 mg Oral QHS   Continuous Infusions: . heparin 1,000 Units/hr (09/28/18 1026)     LOS: 3 days    Time spent: 30 minutes    Joyce Heitman Darleen Crocker, DO [facility] Pager [phone]  If 7PM-7AM, please contact night-coverage [email or web address] Password [email or web address] 09/28/2018, 11:14 AM

## 2018-09-28 NOTE — Consult Note (Signed)
Charles [name] requested for preoperative stoma site marking  Discussed surgical procedure and stoma creation with patient, no family due to [place] restrictions. Patient has had an ileostomy previously in 2012-2013, This was at [place] [facility] the [name] team.  Provided the patient with educational booklet and provided samples of pouching options.  Answered patien questions. Patient can not see without his glasses, I have contacted his wife Charles [name] to request she bring his glasses, his electric razor and underwear per his request  Examined patient lying, sitting, and standing in order to place the marking in the patient's visual field, away from any creases or abdominal contour issues and within the rectus muscle.  Attempted to mark below the patient's belt line.   Marked for loop colostomy in the LLQ  3____ cm to the left of the umbilicus and __1__UO above  the umbilicus.  Patient's abdomen cleansed with CHG wipes at site markings, allowed to air dry prior to marking.Covered mark with thin film transparent dressing to preserve mark until date of surgery.   [place] [name] team will follow up with patient after surgery for continue ostomy care and teaching.  Vaughn MSN, [place], [place], [place]

## 2018-09-28 NOTE — Progress Notes (Signed)
ANTICOAGULATION CONSULT NOTE - Follow-UP  Consult  Pharmacy Consult for heparin dosing Indication: hx VTE  Allergies  Allergen Reactions  . Oxycodone     Blisters, hallucinations  . Tramadol     Blisters, hallucinations  . Trazodone And Nefazodone Other (See Comments)    hallucinations    Patient Measurements: Height: 6\' 2"  (188 cm) Weight: 192 lb 14.4 oz (87.5 kg) IBW/kg (Calculated) : 82.2 Heparin Dosing Weight: HEPARIN DW (KG): 87.5   Vital Signs: Temp: 98.5 F (36.9 C) (05/26 0645) Temp Source: Oral (05/26 0645) BP: 158/84 (05/26 0645) Pulse Rate: 98 (05/26 0645)  Labs: Recent Labs    09/26/18 0421  09/26/18 1108 09/26/18 1918 09/27/18 0430 09/28/18 0653  HGB 11.4*  --   --   --  11.8*  --   HCT 34.2*  --   --   --  35.1*  --   PLT 211  --   --   --  217  --   APTT  --   --  52*  --   --   --   HEPARINUNFRC  --    < > 0.41 0.81* 0.65 1.06*  CREATININE 0.65  --   --   --  0.67 0.68   < > = values in this interval not displayed.    Estimated Creatinine Clearance: 102.8 mL/min (by C-G formula based on SCr of 0.68 mg/dL).   Medical History: Past Medical History:  Diagnosis Date  . Chronic anticoagulation   . Colon cancer ([place]) 07/24/2010   rectal ca, inv adenocarcinoma  . Depression 04/01/2011  . Diabetes mellitus without complication ([place])   . DVT (deep venous thrombosis) ([place]) 05/09/2011  . GERD (gastroesophageal reflux disease)   . High output ileostomy ([place]) 05/21/2011  . History of kidney stones   . HTN (hypertension)   . Hx of radiation therapy 09/02/10 to 10/14/10   pelvis  . Lung metastasis ([place])   . Neuropathy   . Peripheral vascular disease ([place])    dvt's,pe  . Pneumonia    hx x3  . Pulmonary embolism ([place])   . Rectal cancer ([place]) 01/15/2011   S/P radiation and concurrent 5-FU continuous infusion from 09/09/10- 10/10/10.  S/P proctectomy with colorectal anastomosis and diverting loop ileostomy on 11/14/10 at [facility] by Dr. Harlon Ditty.  Pathology reveals a pT3b N1 with 3/20 lymph nodes.      Assessment: Pharmacy consulted to dose heparin  for this 68 yo male with metastatic rectal carcinoma and history of VTE.  Patient has been on rivaroxaban and received his last dose at 1642 on 09/25/18.  Goal of Therapy:  Heparin level 0.3-0.7 units/ml Monitor platelets by anticoagulation protocol: Yes   Plan:  5-26 am update: HL of 1.06 IU/mL Hold heparin drip for one hour and re-start at 0930 at 1000 units/hr Obtain heparin level at 1600 today Check anti-Xa level daily while on heparin Continue to monitor H&H and platelets   Despina Pole, Pharm. D. Clinical Pharmacist 09/28/2018 8:32 AM

## 2018-09-28 NOTE — H&P (View-Only) (Signed)
4 Days Post-Op  Subjective: No complaints.  Objective: Vital [name] in last 24 hours: Temp:  [98.2 F (36.8 C)-98.5 F (36.9 C)] 98.5 F (36.9 C) (05/26 0645) Pulse Rate:  [87-98] 98 (05/26 0645) Resp:  [16-19] 17 (05/26 0645) BP: (141-158)/(84-88) 158/84 (05/26 0645) SpO2:  [97 %-98 %] 98 % (05/26 0645) Last BM Date: 09/26/18  Intake/Output from previous day: 05/25 0701 - 05/26 0700 In: 1586.9 [P.O.:1080; I.V.:506.9] Out: 1800 [Urine:1800] Intake/Output this shift: No intake/output data recorded.  General appearance: alert, cooperative and no distress GI: Soft, nontender, nondistended.  Lab Results:  Recent Labs    09/26/18 0421 09/27/18 0430  WBC 7.7 7.1  HGB 11.4* 11.8*  HCT 34.2* 35.1*  PLT 211 217   BMET Recent Labs    09/27/18 0430 09/28/18 0653  NA 135 135  K 3.9 4.3  CL 102 101  CO2 23 24  GLUCOSE 113* 117*  BUN 10 9  CREATININE 0.67 0.68  CALCIUM 7.9* 8.2*   PT/INR No results for input(s): LABPROT, INR in the last 72 hours.  Studies/Results: No results found.  Anti-infectives: Anti-infectives (From admission, onward)   None      Assessment/Plan: Impression: Metastatic colorectal carcinoma with anastomotic stricture Plan: Will proceed with transverse loop colostomy tomorrow.  The risks and benefits of the procedure including bleeding, infection, and cardiopulmonary difficulties were fully explained to the patient, who gave informed consent.  This is a palliative procedure.  Enterostomal therapy nurse will see the patient today to preop Charles [name] the abdomen.  LOS: 3 days    Charles [name] 09/28/2018

## 2018-09-28 NOTE — Progress Notes (Signed)
ANTICOAGULATION CONSULT NOTE - Follow-UP  Consult  Pharmacy Consult for heparin dosing Indication: hx VTE  Allergies  Allergen Reactions  . Oxycodone     Blisters, hallucinations  . Tramadol     Blisters, hallucinations  . Trazodone And Nefazodone Other (See Comments)    hallucinations    Patient Measurements: Height: 6\' 2"  (188 cm) Weight: 192 lb 14.4 oz (87.5 kg) IBW/kg (Calculated) : 82.2 Heparin Dosing Weight: HEPARIN DW (KG): 87.5   Vital Signs: BP: 143/87 (05/26 1443) Pulse Rate: 99 (05/26 1443)  Labs: Recent Labs    09/26/18 0421 09/26/18 1108  09/27/18 0430 09/28/18 0653 09/28/18 1540  HGB 11.4*  --   --  11.8*  --   --   HCT 34.2*  --   --  35.1*  --   --   PLT 211  --   --  217  --   --   APTT  --  52*  --   --   --   --   HEPARINUNFRC  --  0.41   < > 0.65 1.06* 0.62  CREATININE 0.65  --   --  0.67 0.68  --    < > = values in this interval not displayed.    Estimated Creatinine Clearance: 102.8 mL/min (by C-G formula based on SCr of 0.68 mg/dL).   Medical History: Past Medical History:  Diagnosis Date  . Chronic anticoagulation   . Colon cancer ([place]) 07/24/2010   rectal ca, inv adenocarcinoma  . Depression 04/01/2011  . Diabetes mellitus without complication ([place])   . DVT (deep venous thrombosis) ([place]) 05/09/2011  . GERD (gastroesophageal reflux disease)   . High output ileostomy ([place]) 05/21/2011  . History of kidney stones   . HTN (hypertension)   . Hx of radiation therapy 09/02/10 to 10/14/10   pelvis  . Lung metastasis ([place])   . Neuropathy   . Peripheral vascular disease ([place])    dvt's,pe  . Pneumonia    hx x3  . Pulmonary embolism ([place])   . Rectal cancer ([place]) 01/15/2011   S/P radiation and concurrent 5-FU continuous infusion from 09/09/10- 10/10/10.  S/P proctectomy with colorectal anastomosis and diverting loop ileostomy on 11/14/10 at [facility] by Dr. Harlon Ditty. Pathology reveals a pT3b N1 with 3/20 lymph nodes.       Assessment: Pharmacy consulted to dose heparin  for this 68 yo male with metastatic rectal carcinoma and history of VTE.  Patient has been on rivaroxaban and received his last dose at 1642 on 09/25/18.  Goal of Therapy:  Heparin level 0.3-0.7 units/ml Monitor platelets by anticoagulation protocol: Yes   Plan:   Continue heparin drip at 1000 units/hur Check anti-Xa level daily while on heparin Continue to monitor H&H and platelets   Margot Ables, PharmD Clinical Pharmacist 09/28/2018 7:17 PM

## 2018-09-29 ENCOUNTER — Encounter (HOSPITAL_COMMUNITY): Payer: Self-pay | Admitting: Emergency Medicine

## 2018-09-29 ENCOUNTER — Encounter (HOSPITAL_COMMUNITY)
Admission: EM | Disposition: A | Discharge status: Home Health | Payer: Self-pay | Source: Home / Self Care | Attending: Internal Medicine

## 2018-09-29 ENCOUNTER — Other Ambulatory Visit: Payer: Self-pay

## 2018-09-29 ENCOUNTER — Inpatient Hospital Stay (HOSPITAL_COMMUNITY): Payer: Medicare Other | Admitting: Anesthesiology

## 2018-09-29 DIAGNOSIS — C19 Malignant neoplasm of rectosigmoid junction: Secondary | ICD-10-CM

## 2018-09-29 HISTORY — PX: TRANSVERSE LOOP COLOSTOMY: SHX6478

## 2018-09-29 LAB — CBC
HCT: 36 % — ABNORMAL LOW (ref 39.0–52.0)
Hemoglobin: 11.9 g/dL — ABNORMAL LOW (ref 13.0–17.0)
MCH: 32.4 pg (ref 26.0–34.0)
MCHC: 33.1 g/dL (ref 30.0–36.0)
MCV: 98.1 fL (ref 80.0–100.0)
Platelets: 240 10*3/uL (ref 150–400)
RBC: 3.67 MIL/uL — ABNORMAL LOW (ref 4.22–5.81)
RDW: 13.7 % (ref 11.5–15.5)
WBC: 5.8 10*3/uL (ref 4.0–10.5)
nRBC: 0 % (ref 0.0–0.2)

## 2018-09-29 LAB — GLUCOSE, CAPILLARY
Glucose-Capillary: 129 mg/dL — ABNORMAL HIGH (ref 70–99)
Glucose-Capillary: 121 mg/dL — ABNORMAL HIGH (ref 70–99)
Glucose-Capillary: 113 mg/dL — ABNORMAL HIGH (ref 70–99)
Glucose-Capillary: 123 mg/dL — ABNORMAL HIGH (ref 70–99)
Glucose-Capillary: 130 mg/dL — ABNORMAL HIGH (ref 70–99)
Glucose-Capillary: 155 mg/dL — ABNORMAL HIGH (ref 70–99)

## 2018-09-29 LAB — BASIC METABOLIC PANEL
Anion gap: 14 (ref 5–15)
BUN: 7 mg/dL — ABNORMAL LOW (ref 8–23)
CO2: 23 mmol/L (ref 22–32)
Calcium: 8.4 mg/dL — ABNORMAL LOW (ref 8.9–10.3)
Chloride: 102 mmol/L (ref 98–111)
Creatinine, Ser: 0.69 mg/dL (ref 0.61–1.24)
GFR calc Af Amer: >60 mL/min (ref >60)
GFR calc non Af Amer: >60 mL/min (ref >60)
Glucose, Bld: 139 mg/dL — ABNORMAL HIGH (ref 70–99)
Potassium: 3.4 mmol/L — ABNORMAL LOW (ref 3.5–5.1)
Sodium: 139 mmol/L (ref 135–145)

## 2018-09-29 SURGERY — CREATION, COLOSTOMY, LOOP, TRANSVERSE COLON
Anesthesia: General | Site: Abdomen

## 2018-09-29 MED ORDER — BUPIVACAINE LIPOSOME 1.3 % IJ SUSP
INTRAMUSCULAR | Status: DC | PRN
  Administered 2018-09-29: 19 mL

## 2018-09-29 MED ORDER — SUCCINYLCHOLINE CHLORIDE 200 MG/10ML IV SOSY
PREFILLED_SYRINGE | INTRAVENOUS | Status: AC
  Filled 2018-09-29: qty 10

## 2018-09-29 MED ORDER — FENTANYL CITRATE (PF) 250 MCG/5ML IJ SOLN
INTRAMUSCULAR | Status: AC
  Filled 2018-09-29: qty 5

## 2018-09-29 MED ORDER — PROPOFOL 10 MG/ML IV BOLUS
INTRAVENOUS | Status: DC | PRN
  Administered 2018-09-29: 150 mg via INTRAVENOUS

## 2018-09-29 MED ORDER — LORAZEPAM 2 MG/ML IJ SOLN: 1.0000 mg | INTRAMUSCULAR | Status: DC | PRN

## 2018-09-29 MED ORDER — SODIUM CHLORIDE 0.9 % IV SOLN
INTRAVENOUS | Status: AC
  Filled 2018-09-29: qty 1

## 2018-09-29 MED ORDER — ONDANSETRON HCL 4 MG/2ML IJ SOLN
INTRAMUSCULAR | Status: DC | PRN
  Administered 2018-09-29: 4 mg via INTRAVENOUS

## 2018-09-29 MED ORDER — MIDAZOLAM HCL 2 MG/2ML IJ SOLN
INTRAMUSCULAR | Status: AC
  Filled 2018-09-29: qty 2

## 2018-09-29 MED ORDER — FENTANYL CITRATE (PF) 100 MCG/2ML IJ SOLN
INTRAMUSCULAR | Status: DC | PRN
  Administered 2018-09-29 (×2): 100 ug via INTRAVENOUS
  Administered 2018-09-29: 50 ug via INTRAVENOUS

## 2018-09-29 MED ORDER — LACTATED RINGERS IV SOLN
INTRAVENOUS | Status: DC
  Administered 2018-09-29: 09:00:00 via INTRAVENOUS

## 2018-09-29 MED ORDER — SODIUM CHLORIDE 0.9 % IV SOLN
INTRAVENOUS | Status: DC
  Administered 2018-09-29: 12:00:00 50 mL/h via INTRAVENOUS
  Administered 2018-09-30 (×2): via INTRAVENOUS

## 2018-09-29 MED ORDER — ROCURONIUM BROMIDE 10 MG/ML (PF) SYRINGE
PREFILLED_SYRINGE | INTRAVENOUS | Status: AC
  Filled 2018-09-29: qty 10

## 2018-09-29 MED ORDER — SUCCINYLCHOLINE CHLORIDE 20 MG/ML IJ SOLN
INTRAMUSCULAR | Status: DC | PRN
  Administered 2018-09-29: 120 mg via INTRAVENOUS

## 2018-09-29 MED ORDER — FENTANYL CITRATE (PF) 100 MCG/2ML IJ SOLN
50.0000 ug | INTRAMUSCULAR | Status: DC | PRN
  Administered 2018-09-29 (×2): 50 ug via INTRAVENOUS

## 2018-09-29 MED ORDER — KETOROLAC TROMETHAMINE 15 MG/ML IJ SOLN
15.0000 mg | Freq: Four times a day (QID) | INTRAMUSCULAR | Status: AC
  Administered 2018-09-29: 15 mg via INTRAVENOUS
  Filled 2018-09-29: qty 1

## 2018-09-29 MED ORDER — FENTANYL CITRATE (PF) 100 MCG/2ML IJ SOLN
INTRAMUSCULAR | Status: AC
  Filled 2018-09-29: qty 2

## 2018-09-29 MED ORDER — ONDANSETRON HCL 4 MG/2ML IJ SOLN
INTRAMUSCULAR | Status: AC
  Filled 2018-09-29: qty 2

## 2018-09-29 MED ORDER — 0.9 % SODIUM CHLORIDE (POUR BTL) OPTIME
TOPICAL | Status: DC | PRN
  Administered 2018-09-29: 1000 mL

## 2018-09-29 MED ORDER — MIDAZOLAM HCL 5 MG/5ML IJ SOLN
INTRAMUSCULAR | Status: DC | PRN
  Administered 2018-09-29: 2 mg via INTRAVENOUS

## 2018-09-29 MED ORDER — HYDROMORPHONE HCL 1 MG/ML IJ SOLN
1.0000 mg | INTRAMUSCULAR | Status: DC | PRN
  Administered 2018-09-29: 1 mg via INTRAVENOUS
  Filled 2018-09-29: qty 1

## 2018-09-29 MED ORDER — SUGAMMADEX SODIUM 200 MG/2ML IV SOLN
INTRAVENOUS | Status: DC | PRN
  Administered 2018-09-29: 200 mg via INTRAVENOUS

## 2018-09-29 MED ORDER — HYDROMORPHONE HCL 1 MG/ML IJ SOLN
INTRAMUSCULAR | Status: AC
  Filled 2018-09-29: qty 0.5

## 2018-09-29 MED ORDER — ENOXAPARIN SODIUM 40 MG/0.4ML ~~LOC~~ SOLN
40.0000 mg | SUBCUTANEOUS | Status: DC
  Administered 2018-09-30: 40 mg via SUBCUTANEOUS
  Filled 2018-09-29: qty 0.4

## 2018-09-29 MED ORDER — KETOROLAC TROMETHAMINE 15 MG/ML IJ SOLN
15.0000 mg | Freq: Four times a day (QID) | INTRAMUSCULAR | Status: DC | PRN
  Administered 2018-09-30 – 2018-10-01 (×2): 15 mg via INTRAVENOUS
  Filled 2018-09-29 (×2): qty 1

## 2018-09-29 MED ORDER — LIDOCAINE 2% (20 MG/ML) 5 ML SYRINGE
INTRAMUSCULAR | Status: AC
  Filled 2018-09-29: qty 5

## 2018-09-29 MED ORDER — CHLORHEXIDINE GLUCONATE CLOTH 2 % EX PADS: 6.0000 | MEDICATED_PAD | Freq: Once | CUTANEOUS | Status: DC

## 2018-09-29 MED ORDER — SIMETHICONE 80 MG PO CHEW: 40.0000 mg | CHEWABLE_TABLET | Freq: Four times a day (QID) | ORAL | Status: DC | PRN

## 2018-09-29 MED ORDER — ROCURONIUM BROMIDE 10 MG/ML (PF) SYRINGE
PREFILLED_SYRINGE | INTRAVENOUS | Status: DC | PRN
  Administered 2018-09-29: 40 mg via INTRAVENOUS

## 2018-09-29 SURGICAL SUPPLY — 30 items
BAG UROSTOMY 4 LOOP 90 STRL (OSTOMY) ×2 IMPLANT
CHLORAPREP W/TINT 26 (MISCELLANEOUS) ×2 IMPLANT
CLOTH BEACON ORANGE TIMEOUT ST (SAFETY) ×2 IMPLANT
COVER LIGHT HANDLE STERIS (MISCELLANEOUS) ×4 IMPLANT
COVER WAND RF STERILE (DRAPES) ×1 IMPLANT
ELECT REM PT RETURN 9FT ADLT (ELECTROSURGICAL) ×2
ELECTRODE REM PT RTRN 9FT ADLT (ELECTROSURGICAL) ×1 IMPLANT
GAUZE SPONGE 4X4 12PLY STRL LF (GAUZE/BANDAGES/DRESSINGS) ×1 IMPLANT
GLOVE BIO SURGEON STRL SZ7 (GLOVE) ×1 IMPLANT
GLOVE BIOGEL PI IND STRL 7.0 (GLOVE) ×2 IMPLANT
GLOVE BIOGEL PI INDICATOR 7.0 (GLOVE) ×3
GLOVE ECLIPSE 6.5 STRL STRAW (GLOVE) ×1 IMPLANT
GLOVE SURG SS PI 7.5 STRL IVOR (GLOVE) ×2 IMPLANT
GOWN STRL REUS W/ TWL XL LVL3 (GOWN DISPOSABLE) ×1 IMPLANT
GOWN STRL REUS W/TWL LRG LVL3 (GOWN DISPOSABLE) ×4 IMPLANT
GOWN STRL REUS W/TWL XL LVL3 (GOWN DISPOSABLE) ×1
INST SET MAJOR GENERAL (KITS) ×2 IMPLANT
KIT TURNOVER KIT A (KITS) ×2 IMPLANT
LIGASURE IMPACT 36 18CM CVD LR (INSTRUMENTS) ×1 IMPLANT
MANIFOLD NEPTUNE II (INSTRUMENTS) ×2 IMPLANT
NDL HYPO 18GX1.5 BLUNT FILL (NEEDLE) ×1 IMPLANT
NDL HYPO 21X1.5 SAFETY (NEEDLE) ×1 IMPLANT
NEEDLE HYPO 18GX1.5 BLUNT FILL (NEEDLE) ×2 IMPLANT
NEEDLE HYPO 21X1.5 SAFETY (NEEDLE) ×2 IMPLANT
NS IRRIG 1000ML POUR BTL (IV SOLUTION) ×4 IMPLANT
PACK ABDOMINAL MAJOR (CUSTOM PROCEDURE TRAY) ×2 IMPLANT
PAD ARMBOARD 7.5X6 YLW CONV (MISCELLANEOUS) ×2 IMPLANT
SET BASIN LINEN APH (SET/KITS/TRAYS/PACK) ×2 IMPLANT
SUT CHROMIC 3 0 SH 27 (SUTURE) ×2 IMPLANT
SUT SILK 3 0 SH CR/8 (SUTURE) ×1 IMPLANT

## 2018-09-29 NOTE — Op Note (Signed)
Patient:  Charles [name]  DOB:  01/13/1951  MRN:  [id number]   Preop Diagnosis: Anastomotic stricture, metastatic colorectal carcinoma  Postop Diagnosis: Same  Procedure: Transverse loop colostomy (palliative)  Surgeon: Aviva Signs, MD  Anes: General endotracheal  Indications: Patient is a 68 year old white male with metastatic colorectal carcinoma who has a worsening colorectal stricture.  He now presents for a palliative transverse loop colostomy.  The risks and benefits of the procedure including bleeding, infection, and cardiopulmonary difficulties were fully explained to the patient, who gave informed consent.  His heparin drip was stopped at midnight.  Procedure note: The patient was placed in the supine position.  After induction of general endotracheal anesthesia, the abdomen was prepped and draped using usual sterile technique with ChloraPrep.  Surgical site confirmation was performed.  The patient had already been marked preoperatively by the enterostomal therapy nurse.  This was in the left upper quadrant of the abdomen.  A transverse incision was made in this region.  This was taken down to the fascia.  The fascia was incised transversely without difficulty.  The peritoneal cavity was entered into without difficulty.  Some rectus muscle was divided in order to facilitate exposure.  A transverse colon was identified.  In order to facilitate exposure, some greater omentum was excised using the LigaSure.  It was sent to pathology further examination.  The loop was then brought out through the wound and a loop colostomy bar was placed.  The colon was then fixated to both the fascia and the skin using 3-0 Chromic Gut interrupted sutures and 3-0 silk sutures in order to prevent retraction.  The colostomy wafer was then applied.  The colon was then opened along a tenea.  Adequate openings were noted both proximally and distally.  A colostomy bag was then applied.  All tape and needle  counts were correct at the end of the procedure.  The patient was extubated in the operating room and transferred to PACU in stable condition.  Complications: None  EBL: 25 cc  Specimen: Greater omentum

## 2018-09-29 NOTE — Transfer of Care (Signed)
Immediate Anesthesia Transfer of Care Note  Patient: Charles [name]  Procedure(s) Performed: TRANSVERSE LOOP COLOSTOMY (N/A Abdomen)  Patient Location: PACU  Anesthesia Type:General  Level of Consciousness: awake, alert  and oriented  Airway & Oxygen Therapy: Patient Spontanous Breathing and Patient connected to face mask oxygen  Post-op Assessment: Report given to RN and Post -op Vital signs reviewed and stable  Post vital signs: Reviewed and stable  Last Vitals:  Vitals Value Taken Time  BP    Temp    Pulse 77 09/29/2018 10:07 AM  Resp    SpO2 81 % 09/29/2018 10:07 AM  Vitals shown include unvalidated device data.  Last Pain:  Vitals:   09/29/18 0831  TempSrc: Oral  PainSc: 0-No pain      Patients Stated Pain Goal: 2 (07/62/26 3335)  Complications: No apparent anesthesia complications

## 2018-09-29 NOTE — Progress Notes (Signed)
Patient Demographics:    Charles [name], is a 68 y.o. male, DOB - [birth year], TGY:[id number]  Admit date - 09/22/2018   Admitting Physician Reubin Milan, MD  Outpatient Primary MD for the patient is [name], MD  LOS - 4   Chief Complaint  Patient presents with   Emesis        Subjective:    Charles [name] today has no fevers, no emesis,  No chest pain,     Assessment  & Plan :    Principal Problem:   Diarrhea in adult patient Active Problems:   GERD   Peripheral neuropathy due to oxaliplatin-chemotherapy   Diarrhea   DVT (deep venous thrombosis) ([place])   Colon cancer ([name])   History of pulmonary embolism   Hypokalemia   Type 2 diabetes mellitus ([name])   Hypomagnesemia   Dilated gallbladder   Abnormal CT scan, colon   Anastomotic stricture of colorectal region   Metastatic colorectal cancer [name])  Brief summary 68 year old white male with anxiety, depression, type 2 diabetes, GERD, history of high output ileostomy, history of urolithiasis, hypertension, history of metastatic colorectal cancer, history of proctectomy, radiation therapy and chemotherapy (last cycle about 6 weeks ago), DVTs, history of pulmonary embolism on Xarelto admitted 09/22/18 department due to persistent diarrhea for the past 4 days and worsening colorectal stricture. S/p palliative transverse loop colostomy on 09/29/18  A/p  Diarrhea inimmunocompromised patientwith EPEC infection and stenotic anastomosis noted on flex sig 5/22 Recommendations for no antibiotics at this time S/p palliative/diverting colostomy on 5/27 Advance diet per general surgery team  Active Problems: Dilated gallbladder-incidental finding Antiemetics as needed. Analgesics as needed. No signs of acute cholecystitis noted, no LFT elevation noted  Type 2 diabetes mellitus (HCC) Blood glucose stable Hold Amaryl for  now CBG monitoring with sensitive sliding scale until oral intake is more reliable  Metastatic colon cancer - Follow-up with oncology as scheduledwith Dr. [name]  Hypokalemia/hypomagnesemia----replace and recheck  GERD Continue PPI.  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 twice daily.  DVT (deep venous thrombosis)/History of pulmonary embolism-- Patient received heparin bridge preop, per general surgeon hold off on further anticoagulation on 3 09/30/2018   DVT prophylaxis:Heparin drip Code Status:Full Family Communication:Discussed with wife  Disposition Plan:Per general surgery in 1 to 2 days if tolerating oral intake Consultants:  GI  GS  Procedures:  Diverting/palliative colostomy Disposition/Need for in-Hospital Stay- patient unable to be discharged at this time due to inability to tolerate oral intake in the postop patient   Lab Results  Component Value Date   PLT 240 09/29/2018    Inpatient Medications  Scheduled Meds:  Chlorhexidine Gluconate Cloth  6 each Topical Daily   diphenoxylate-atropine  1 tablet Oral BID WC   [START ON 09/30/2018] enoxaparin (LOVENOX) injection  40 mg Subcutaneous Q24H   feeding supplement (GLUCERNA SHAKE)  237 mL Oral TID BM   gabapentin  900 mg Oral BID   insulin aspart  0-9 Units Subcutaneous TID WC   mupirocin ointment  1 application Nasal BID   ondansetron  4 mg Intravenous Once   pantoprazole  40 mg Oral Daily   traZODone  100 mg Oral QHS   Continuous Infusions:  sodium chloride  50 mL/hr (09/29/18 1213)   PRN Meds:.acetaminophen, clonazePAM, HYDROmorphone (DILAUDID) injection, [COMPLETED] ketorolac **FOLLOWED BY** ketorolac, LORazepam, ondansetron, oxyCODONE-acetaminophen **AND** oxyCODONE, prochlorperazine, simethicone    Anti-infectives (From admission, onward)   Start     Dose/Rate Route Frequency Ordered Stop   09/29/18 0600   ertapenem (INVANZ) 1,000 mg in sodium chloride 0.9 % 100 mL IVPB     1 g 200 mL/hr over 30 Minutes Intravenous On call to O.R. 09/28/18 1921 09/29/18 0926        Objective:   Vitals:   09/29/18 1045 09/29/18 1100 09/29/18 1108 09/29/18 1447  BP: (!) 150/83  (!) 148/76 138/77  Pulse: 86 89  86  Resp: 18 (!) 23 18 18   Temp:   98.3 F (36.8 C) 97.6 F (36.4 C)  TempSrc:    Oral  SpO2: 100% 96% 97% 97%  Weight:      Height:        Wt Readings from Last 3 Encounters:  09/26/18 87.5 kg  09/20/18 87.8 kg  09/16/18 89.8 kg     Intake/Output Summary (Last 24 hours) at 09/29/2018 1811 Last data filed at 09/29/2018 1700 Gross per 24 hour  Intake 1795.73 ml  Output 175 ml  Net 1620.73 ml     Physical Exam Patient is examined daily including today on 09/29/18 , exams remain the same as of yesterday except that has changed   Gen:- Awake Alert,  In no apparent distress  HEENT:- [place].AT, No sclera icterus Neck-Supple Neck,No JVD,.  Lungs-  CTAB , fair symmetrical air movement CV- S1, S2 normal, regular  Abd-  +ve B.Sounds, Abd Soft, appropriate postop tenderness,    Extremity/Skin:- No  edema, pedal pulses present  Psych-affect is appropriate, oriented x3 Neuro-no new focal deficits, no tremors   Data Review:   Micro Results Recent Results (from the past 240 hour(s))  Gastrointestinal Panel by PCR , Stool     Status: Abnormal   Collection Time: 09/22/18  4:04 PM  Result Value Ref Range Status   Campylobacter species NOT DETECTED NOT DETECTED Final   Plesimonas shigelloides NOT DETECTED NOT DETECTED Final   Salmonella species NOT DETECTED NOT DETECTED Final   Yersinia enterocolitica NOT DETECTED NOT DETECTED Final   Vibrio species NOT DETECTED NOT DETECTED Final   Vibrio cholerae NOT DETECTED NOT DETECTED Final   Enteroaggregative E coli (EAEC) NOT DETECTED NOT DETECTED Final   Enteropathogenic E coli (EPEC) DETECTED (A) NOT DETECTED Final    Comment: CRITICAL RESULT  CALLED TO, READ BACK BY AND VERIFIED WITH: CALLED TO DANA COX @1442  09/23/2018 SAC    Enterotoxigenic E coli (ETEC) NOT DETECTED NOT DETECTED Final   Shiga like toxin producing E coli (STEC) NOT DETECTED NOT DETECTED Final   Shigella/Enteroinvasive E coli (EIEC) NOT DETECTED NOT DETECTED Final   Cryptosporidium NOT DETECTED NOT DETECTED Final   Cyclospora cayetanensis NOT DETECTED NOT DETECTED Final   Entamoeba histolytica NOT DETECTED NOT DETECTED Final   Giardia lamblia NOT DETECTED NOT DETECTED Final   Adenovirus F40/41 NOT DETECTED NOT DETECTED Final   Astrovirus NOT DETECTED NOT DETECTED Final   Norovirus GI/GII NOT DETECTED NOT DETECTED Final   Rotavirus A NOT DETECTED NOT DETECTED Final   Sapovirus (I, II, IV, and V) NOT DETECTED NOT DETECTED Final    Comment: Performed at [facility], [address] [place], [place] [address]  C difficile quick scan w PCR reflex     Status: None   Collection Time: 09/22/18  4:05 PM  Result Value Ref Range Status   C Diff antigen NEGATIVE NEGATIVE Final   C Diff toxin NEGATIVE NEGATIVE Final   C Diff interpretation No C. difficile detected.  Final    Comment: VALID Performed at [facility], [address], [place], [place] [address]   SARS Coronavirus 2 (CEPHEID - Performed in [place] hospital lab), Hosp Order     Status: None   Collection Time: 09/22/18  7:46 PM  Result Value Ref Range Status   SARS Coronavirus 2 NEGATIVE NEGATIVE Final    Comment: (NOTE) If result is NEGATIVE SARS-CoV-2 target nucleic acids are NOT DETECTED. The SARS-CoV-2 RNA is generally detectable in upper and lower  respiratory specimens during the acute phase of infection. The lowest  concentration of SARS-CoV-2 viral copies this assay can detect is 250  copies / mL. A negative result does not preclude SARS-CoV-2 infection  and should not be used as the sole basis for treatment or other  patient management decisions.  A negative result may occur with   improper specimen collection / handling, submission of specimen other  than nasopharyngeal swab, presence of viral mutation(s) within the  areas targeted by this assay, and inadequate number of viral copies  (<250 copies / mL). A negative result must be combined with clinical  observations, patient history, and epidemiological information. If result is POSITIVE SARS-CoV-2 target nucleic acids are DETECTED. The SARS-CoV-2 RNA is generally detectable in upper and lower  respiratory specimens dur ing the acute phase of infection.  Positive  results are indicative of active infection with SARS-CoV-2.  Clinical  correlation with patient history and other diagnostic information is  necessary to determine patient infection status.  Positive results do  not rule out bacterial infection or co-infection with other viruses. If result is PRESUMPTIVE POSTIVE SARS-CoV-2 nucleic acids MAY BE PRESENT.   A presumptive positive result was obtained on the submitted specimen  and confirmed on repeat testing.  While 2019 novel coronavirus  (SARS-CoV-2) nucleic acids may be present in the submitted sample  additional confirmatory testing may be necessary for epidemiological  and / or clinical management purposes  to differentiate between  SARS-CoV-2 and other Sarbecovirus currently known to infect humans.  If clinically indicated additional testing with an alternate test  methodology [phone]) is advised. The SARS-CoV-2 RNA is generally  detectable in upper and lower respiratory sp ecimens during the acute  phase of infection. The expected result is Negative. Fact Sheet for Patients:  StrictlyIdeas.no Fact Sheet for Healthcare Providers: BankingDealers.co.za This test is not yet approved or cleared by the [place] FDA and has been authorized for detection and/or diagnosis of SARS-CoV-2 by FDA under an Emergency Use Authorization (EUA).  This EUA will  remain in effect (meaning this test can be used) for the duration of the COVID-19 declaration under Section 564(b)(1) of the Act, 21 U.S.C. section 360bbb-3(b)(1), unless the authorization is terminated or revoked sooner. Performed at [facility], [address] [place], [place] [address]   Culture, blood (routine x 2)     Status: None   Collection Time: 09/22/18 11:32 PM  Result Value Ref Range Status   Specimen Description BLOOD RIGHT ANTECUBITAL  Final   Special Requests   Final    BOTTLES DRAWN AEROBIC AND ANAEROBIC Blood Culture adequate volume   Culture   Final    NO GROWTH 5 DAYS Performed at [facility], [address], [place], [place] [address]    Report Status  09/27/2018 FINAL  Final  Culture, blood (routine x 2)     Status: None   Collection Time: 09/22/18 11:39 PM  Result Value Ref Range Status   Specimen Description BLOOD RIGHT HAND  Final   Special Requests   Final    BOTTLES DRAWN AEROBIC AND ANAEROBIC Blood Culture adequate volume   Culture   Final    NO GROWTH 5 DAYS Performed at [facility], [address], [place], [place] [address]    Report Status 09/27/2018 FINAL  Final  Culture, blood (routine x 2)     Status: None (Preliminary result)   Collection Time: 09/25/18  4:16 PM  Result Value Ref Range Status   Specimen Description RIGHT ANTECUBITAL  Final   Special Requests   Final    BOTTLES DRAWN AEROBIC AND ANAEROBIC Blood Culture adequate volume   Culture   Final    NO GROWTH 4 DAYS Performed at [facility], [address] [place], [place] [address]    Report Status PENDING  Incomplete  Culture, blood (routine x 2)     Status: None (Preliminary result)   Collection Time: 09/25/18  4:16 PM  Result Value Ref Range Status   Specimen Description BLOOD RIGHT ARM  Final   Special Requests   Final    BOTTLES DRAWN AEROBIC AND ANAEROBIC Blood Culture adequate volume   Culture   Final    NO GROWTH 4 DAYS Performed at [facility], [address]  [place], [place] [address]    Report Status PENDING  Incomplete  Surgical pcr screen     Status: Abnormal   Collection Time: 09/28/18  7:20 PM  Result Value Ref Range Status   MRSA, PCR NEGATIVE NEGATIVE Final   Staphylococcus aureus POSITIVE (A) NEGATIVE Final    Comment: RESULT CALLED TO, READ BACK BY AND VERIFIED WITH: THOMAS,C ON 09/28/18 AT 2145 BY LOY,C (NOTE) The Xpert SA Assay (FDA approved for NASAL specimens in patients 66 years of age and older), is one component of a comprehensive surveillance program. It is not intended to diagnose infection nor to guide or monitor treatment. Performed at [facility], [address], [place], [place] [address]     Radiology Reports Dg Chest 2 View  Result Date: 09/22/2018 CLINICAL DATA:  Diarrhea central chest pain EXAM: CHEST - 2 VIEW COMPARISON:  03/22/2018, CT chest 07/30/2018 FINDINGS: Right-sided central venous port tip over the SVC. Chronic elevation of right diaphragm with scarring at the right middle lobe. CT demonstrated right upper lobe lung nodules not well seen. Stable cardiomediastinal silhouette. No pneumothorax. IMPRESSION: No active cardiopulmonary disease. Stable right middle lobe scarring. Electronically Signed   By: Donavan Foil M.D.   On: 09/22/2018 16:59   Ct Abdomen Pelvis W Contrast  Result Date: 09/22/2018 CLINICAL DATA:  Stage IV rectal cancer with pancreatic metastases. Status post common bile duct stent placement. EXAM: CT ABDOMEN AND PELVIS WITH CONTRAST TECHNIQUE: Multidetector CT imaging of the abdomen and pelvis was performed using the standard protocol following bolus administration of intravenous contrast. CONTRAST:  135mL OMNIPAQUE IOHEXOL 300 MG/ML  SOLN COMPARISON:  MRI 09/10/2018.  CT scan 07/30/2018. FINDINGS: Lower chest: The heart size is normal. No substantial pericardial effusion. Coronary artery calcification is evident. Stable 7 mm anterior right lung nodule. Atelectasis noted lower lobes bilaterally.  Hepatobiliary: No suspicious focal abnormality within the liver parenchyma. Gallbladder is distended with ill-defined gallbladder wall suggesting edema. Pneumobilia is compatible with the presence of the common bile duct stent.  Pancreas: Diffuse pancreatic parenchymal atrophy with associated dilatation of the main pancreatic duct. Imaging features similar to prior with abrupt cut off at the level of the pancreatic head. 2.0 cm hypoenhancing lesion in the posterior pancreatic head is similar to prior MRI when it was measured at 2.2 cm. Spleen: No splenomegaly. No focal mass lesion. Adrenals/Urinary Tract: Stable adrenal glands bilaterally without mass lesion. Kidneys unremarkable. No evidence for hydroureter. The urinary bladder appears normal for the degree of distention. Stomach/Bowel: Stomach is distended with fluid and food material. Duodenum is normally positioned as is the ligament of Treitz. No small bowel wall thickening. No small bowel dilatation. No small bowel wall thickening. No small bowel dilatation. The terminal ileum is normal. Appendicoliths noted in the tip of the appendix without periappendiceal edema or inflammation. Anastomotic staple line noted at the rectosigmoid junction with circumferential wall thickening in the rectum, progressed since 07/30/2018. Vascular/Lymphatic: There is abdominal aortic atherosclerosis without aneurysm. IVC filter identified in situ. Stranding in the hepato duodenal ligament may be related to edema. There is no gastrohepatic or hepatoduodenal ligament lymphadenopathy. No intraperitoneal or retroperitoneal lymphadenopathy. The 8 mm short axis celiac axis lymph node is stable and upper normal for size. Similar upper normal lymph nodes are seen in the portal caval space. No pelvic sidewall lymphadenopathy. Reproductive: The prostate gland and seminal vesicles are unremarkable. Other: No intraperitoneal free fluid. Musculoskeletal: Bilateral groin hernias contain only  fat. No worrisome lytic or sclerotic osseous abnormality. IMPRESSION: 1. Marked interval increase in circumferential wall thickening in the rectum, adjacent to the anastomosis. Recurrent disease a distinct concern. 2. Stomach is distended with fluid and food material. No associated small bowel dilatation. No obstructing lesion to suggest gastric outlet obstruction by CT. 3. Gallbladder is distended with apparent gallbladder wall edema. Cholecystitis a concern by imaging. 4. Stable appearance of the posterior pancreatic head lesion with diffuse pancreatic parenchymal atrophy and chronic dilatation of the main pancreatic duct. By report, the pancreatic head lesion represents metastatic colorectal cancer. 5. Stable upper normal lymph nodes in the celiac axis. 6.  Aortic Atherosclerois (ICD10-170.0) 7. Stable anterior right upper lobe pulmonary nodule. Electronically Signed   By: Misty Stanley M.D.   On: 09/22/2018 17:30   Mr 3d Recon At Scanner  Result Date: 09/10/2018 CLINICAL DATA:  Right upper quadrant abdominal pain. 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 EXAM: MRI ABDOMEN WITHOUT AND WITH CONTRAST (INCLUDING MRCP) TECHNIQUE: Multiplanar multisequence MR imaging of the abdomen was performed both before and after the administration of intravenous contrast. Heavily T2-weighted images of the biliary and pancreatic ducts were obtained, and three-dimensional MRCP images were rendered by post processing. CONTRAST:  9 mL Gadovist IV COMPARISON:  Right upper quadrant ultrasound dated 09/10/2018 FINDINGS: Lower chest: Lungs are essentially clear in this patient with known pulmonary nodules on prior CT. Hepatobiliary: Liver is within normal limits. No suspicious/enhancing hepatic lesions. Mildly distended gallbladder.  No layering gallstones. Mild intrahepatic ductal dilatation in the left hepatic lobe (series 5006/image 19). Common duct measures 8 mm, mildly prominent. No choledocholithiasis is  seen. Pancreas: Chronic dilatation of the main pancreatic duct, measuring up to 11 mm, with associated atrophy of the pancreatic body/tail. Underlying 1.3 x 2.1 cm obstructing mass in the pancreatic head, previously 1.7 cm on CT. Spleen:  Within normal limits. Adrenals/Urinary Tract:  Adrenal glands are within normal limits. Kidneys are notable for bilateral renal sinus cysts. No hydronephrosis. Stomach/Bowel: Stomach is within normal limits. Visualized bowel is unremarkable. Vascular/Lymphatic:  No evidence of abdominal aortic aneurysm. No suspicious abdominal lymphadenopathy. Other:  No abdominal ascites. Musculoskeletal: No focal osseous lesions. IMPRESSION: 1.3 x 2.1 cm mass in the pancreatic head, mildly increased. Reportedly, this reflects a pancreatic metastasis, although primary pancreatic neoplasm could have a similar appearance. Associated chronic dilatation of the main pancreatic duct with atrophy of the pancreatic body/tail. Secondary mild intrahepatic and extrahepatic ductal dilatation. Common duct measures 8 mm. No choledocholithiasis is seen. Electronically Signed   By: Julian Hy M.D.   On: 09/10/2018 18:07   Dg Chest Port 1 View  Result Date: 09/25/2018 CLINICAL DATA:  Diarrhea.  History of cancer and diabetes. EXAM: PORTABLE CHEST 1 VIEW COMPARISON:  Radiographs 09/22/2018.  CT 07/30/2018. FINDINGS: 1608 hours. Mild patient rotation to the right. Right subclavian Port-A-Cath appears unchanged at the lower SVC level. The heart size and mediastinal contours are stable. There is stable opacity at the right lung base and mild right lung nodularity. The left lung is clear. There is no pleural effusion or pneumothorax. IMPRESSION: Stable chest without acute findings. Electronically Signed   By: Richardean Sale M.D.   On: 09/25/2018 16:29   Dg Ercp  Result Date: 09/16/2018 CLINICAL DATA:  Obstructive jaundice. ERCP with balloon dilatation and biliary stent placement. EXAM: ERCP TECHNIQUE:  Multiple spot images obtained with the fluoroscopic device and submitted for interpretation post-procedure. COMPARISON:  MRCP-09/10/2018; CT abdomen pelvis-07/30/2018 FLUOROSCOPY TIME:  2 minutes, 48 seconds FINDINGS: Six spot fluoroscopic images of the right upper abdominal quadrant during ERCP are provided for review Initial image demonstrates an ERCP probe overlying the right upper abdominal quadrant. Incidentally noted IVC filter. There is selective cannulation and opacification of the common bile duct which appears to demonstrate malignant narrowing/occlusion of its distal aspect. Subsequent images demonstrate balloon dilatation of the distal aspect of the CBD. Completion image demonstrates placement of an internal biliary stent overlying the expected location of the mid and distal aspect of the CBD. IMPRESSION: ERCP with biliary dilatation and stent placement as detailed above. These images were submitted for radiologic interpretation only. Please see the procedural report for the amount of contrast and the fluoroscopy time utilized. Electronically Signed   By: Sandi Mariscal M.D.   On: 09/16/2018 14:38   Mr Abdomen Mrcp Moise Boring Contast  Result Date: 09/10/2018 CLINICAL DATA:  Right upper quadrant abdominal pain. 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 EXAM: MRI ABDOMEN WITHOUT AND WITH CONTRAST (INCLUDING MRCP) TECHNIQUE: Multiplanar multisequence MR imaging of the abdomen was performed both before and after the administration of intravenous contrast. Heavily T2-weighted images of the biliary and pancreatic ducts were obtained, and three-dimensional MRCP images were rendered by post processing. CONTRAST:  9 mL Gadovist IV COMPARISON:  Right upper quadrant ultrasound dated 09/10/2018 FINDINGS: Lower chest: Lungs are essentially clear in this patient with known pulmonary nodules on prior CT. Hepatobiliary: Liver is within normal limits. No suspicious/enhancing hepatic lesions. Mildly  distended gallbladder.  No layering gallstones. Mild intrahepatic ductal dilatation in the left hepatic lobe (series 5006/image 19). Common duct measures 8 mm, mildly prominent. No choledocholithiasis is seen. Pancreas: Chronic dilatation of the main pancreatic duct, measuring up to 11 mm, with associated atrophy of the pancreatic body/tail. Underlying 1.3 x 2.1 cm obstructing mass in the pancreatic head, previously 1.7 cm on CT. Spleen:  Within normal limits. Adrenals/Urinary Tract:  Adrenal glands are within normal limits. Kidneys are notable for bilateral renal sinus cysts. No hydronephrosis. Stomach/Bowel: Stomach is within normal limits. Visualized  bowel is unremarkable. Vascular/Lymphatic:  No evidence of abdominal aortic aneurysm. No suspicious abdominal lymphadenopathy. Other:  No abdominal ascites. Musculoskeletal: No focal osseous lesions. IMPRESSION: 1.3 x 2.1 cm mass in the pancreatic head, mildly increased. Reportedly, this reflects a pancreatic metastasis, although primary pancreatic neoplasm could have a similar appearance. Associated chronic dilatation of the main pancreatic duct with atrophy of the pancreatic body/tail. Secondary mild intrahepatic and extrahepatic ductal dilatation. Common duct measures 8 mm. No choledocholithiasis is seen. Electronically Signed   By: Julian Hy M.D.   On: 09/10/2018 18:07   US Abdomen Limited Ruq  Result Date: 09/10/2018 CLINICAL DATA:  Right upper quadrant abdominal pain for 1 year. History rectal cancer. EXAM: ULTRASOUND ABDOMEN LIMITED RIGHT UPPER QUADRANT COMPARISON:  07/30/2018 CT abdomen/pelvis. FINDINGS: Gallbladder: Small amount of layering sludge in mildly distended gallbladder. Mild diffuse gallbladder wall thickening (4 mm gallbladder wall thickness). No cholelithiasis. No sonographic Murphy sign. No pericholecystic fluid. Common bile duct: Diameter: 7 mm Liver: Liver parenchyma is diffusely mildly echogenic. No liver mass detected, noting  decreased sensitivity in the setting of an echogenic liver. Portal vein is patent on color Doppler imaging with normal direction of blood flow towards the liver. IMPRESSION: 1. Mildly distended gallbladder. No cholelithiasis. Layering gallbladder sludge. No pericholecystic fluid. No sonographic Murphy's sign. Findings are nonspecific, most likely chronic. If there is clinical concern for acute acalculous cholecystitis, hepatobiliary scintigraphy study could be obtained for further evaluation. 2. Mildly dilated common bile duct (7 mm diameter), slightly increased from 07/30/2018 CT abdomen study. A cause of biliary obstruction is not identified on this ultrasound study. Recommend correlation with serum bilirubin levels. MRI abdomen with MRCP without and with IV contrast could be obtained for further evaluation as clinically warranted. 3. Nonspecific echogenic liver parenchyma, which could be due to hepatic steatosis and/or fibrosis. No liver masses, noting decreased sensitivity in the setting of an echogenic liver. Electronically Signed   By: Ilona Sorrel M.D.   On: 09/10/2018 12:41     CBC Recent Labs  Lab 09/24/18 0500 09/25/18 0705 09/26/18 0421 09/27/18 0430 09/29/18 0522  WBC 13.8* 10.2 7.7 7.1 5.8  HGB 11.9* 11.7* 11.4* 11.8* 11.9*  HCT 36.0* 35.4* 34.2* 35.1* 36.0*  PLT 194 184 211 217 240  MCV 100.3* 99.7 99.1 98.9 98.1  MCH 33.1 33.0 33.0 33.2 32.4  MCHC 33.1 33.1 33.3 33.6 33.1  RDW 14.1 14.0 14.0 13.8 13.7    Chemistries  Recent Labs  Lab 09/23/18 0552 09/24/18 0500 09/25/18 0705 09/26/18 0421 09/27/18 0430 09/28/18 0653 09/29/18 0522  NA 139 134* 134* 134* 135 135 139  K 3.8 3.7 3.3* 3.9 3.9 4.3 3.4*  CL 107 103 100 102 102 101 102  CO2 22 24 24 24 23 24 23   GLUCOSE 159* 178* 94 116* 113* 117* 139*  BUN 7* 11 12 10 10 9  7*  CREATININE 0.62 0.68 0.64 0.65 0.67 0.68 0.69  CALCIUM 8.1* 8.1* 7.9* 7.8* 7.9* 8.2* 8.4*  MG  --  1.6* 1.7 1.7  --   --   --   AST 22  --   --    --   --   --   --   ALT 38  --   --   --   --   --   --   ALKPHOS 191*  --   --   --   --   --   --   BILITOT 2.7*  --   --   --   --   --   --    ------------------------------------------------------------------------------------------------------------------  No results for input(s): CHOL, HDL, LDLCALC, TRIG, CHOLHDL, LDLDIRECT in the last 72 hours.  No results found for: HGBA1C ------------------------------------------------------------------------------------------------------------------ No results for input(s): TSH, T4TOTAL, T3FREE, THYROIDAB in the last 72 hours.  Invalid input(s): FREET3 ------------------------------------------------------------------------------------------------------------------ No results for input(s): VITAMINB12, FOLATE, FERRITIN, TIBC, IRON, RETICCTPCT in the last 72 hours.  Coagulation profile No results for input(s): INR, PROTIME in the last 168 hours.  No results for input(s): DDIMER in the last 72 hours.  Cardiac Enzymes No results for input(s): CKMB, TROPONINI, MYOGLOBIN in the last 168 hours.  Invalid input(s): CK ------------------------------------------------------------------------------------------------------------------    Component Value Date/Time   BNP 37.0 09/22/2018 1529     Krisanne Lich M.D on 09/29/2018 at 6:11 PM  Go to [email or web address] - for contact info  [facility] - Office  [phone]

## 2018-09-29 NOTE — Interval H&P Note (Signed)
History and Physical Interval Note:  09/29/2018 8:32 AM  Charles [name]  has presented today for surgery, with the diagnosis of metastatic colorectal carcinoma.  The various methods of treatment have been discussed with the patient and family. After consideration of risks, benefits and other options for treatment, the patient has consented to  Procedure(s): TRANSVERSE LOOP COLOSTOMY (N/A) as a surgical intervention.  The patient's history has been reviewed, patient examined, no change in status, stable for surgery.  I have reviewed the patient's chart and labs.  Questions were answered to the patient's satisfaction.     Aviva Signs

## 2018-09-29 NOTE — Anesthesia Preprocedure Evaluation (Signed)
Anesthesia Evaluation  Patient identified by MRN, date of birth, ID band Patient awake    Reviewed: Allergy & Precautions, NPO status , Patient's Chart, lab work & pertinent test results  Airway Mallampati: II  TM Distance: >3 FB Neck ROM: Full    Dental no notable dental hx. (+) Chipped, Teeth Intact   Pulmonary pneumonia, resolved,    Pulmonary exam normal breath sounds clear to auscultation       Cardiovascular Exercise Tolerance: Good hypertension, Pt. on medications + Peripheral Vascular Disease  Normal cardiovascular examI Rhythm:Regular Rate:Normal  H/o DVTs in past, h/o PE on blood thinners chronically  Was active without cardiac issues  Reports prostate Ca 2016 and Pancreas/lung CA in 2018    Neuro/Psych PSYCHIATRIC DISORDERS Depression  Neuromuscular disease    GI/Hepatic Neg liver ROS, hiatal hernia, GERD  Medicated and Controlled,  Endo/Other  negative endocrine ROSdiabetes, Well Controlled, Type 2, Oral Hypoglycemic Agents  Renal/GU negative Renal ROS  negative genitourinary   Musculoskeletal negative musculoskeletal ROS (+)   Abdominal   Peds negative pediatric ROS (+)  Hematology negative hematology ROS (+)   Anesthesia Other Findings   Reproductive/Obstetrics negative OB ROS                             Anesthesia Physical Anesthesia Plan  ASA: IV  Anesthesia Plan: General   Post-op Pain Management:    Induction: Intravenous  PONV Risk Score and Plan:   Airway Management Planned: Oral ETT  Additional Equipment:   Intra-op Plan:   Post-operative Plan: Extubation in OR  Informed Consent: I have reviewed the patients History and Physical, chart, labs and discussed the procedure including the risks, benefits and alternatives for the proposed anesthesia with the patient or authorized representative who has indicated his/her understanding and acceptance.      Dental advisory given  Plan Discussed with: CRNA  Anesthesia Plan Comments: (Plan Full PPE use  Plan GETA d/w pt -WTP same )        Anesthesia Quick Evaluation

## 2018-09-29 NOTE — Progress Notes (Signed)
Pt's wife, Hassan Rowan was informed.

## 2018-09-29 NOTE — Anesthesia Procedure Notes (Signed)
Procedure Name: Intubation Date/Time: 09/29/2018 9:02 AM Performed by: Jonna Munro, CRNA Pre-anesthesia Checklist: Patient identified, Emergency Drugs available, Suction available, Patient being monitored and Timeout performed Patient Re-evaluated:Patient Re-evaluated prior to induction Oxygen Delivery Method: Circle system utilized Preoxygenation: Pre-oxygenation with 100% oxygen Induction Type: IV induction Ventilation: Mask ventilation without difficulty Laryngoscope Size: Mac and 3 Grade View: Grade II Tube type: Oral Tube size: 7.0 mm Number of attempts: 1 Airway Equipment and Method: Stylet Placement Confirmation: ETT inserted through vocal cords under direct vision,  positive ETCO2 and breath sounds checked- equal and bilateral Secured at: 23 cm Tube secured with: Tape Dental Injury: Teeth and Oropharynx as per pre-operative assessment

## 2018-09-29 NOTE — Anesthesia Postprocedure Evaluation (Signed)
Anesthesia Post Note  Patient: MILES LEYDA  Procedure(s) Performed: TRANSVERSE LOOP COLOSTOMY (N/A Abdomen)  Patient location during evaluation: PACU Anesthesia Type: General Level of consciousness: awake and alert and oriented Pain management: pain level controlled Vital Signs Assessment: post-procedure vital signs reviewed and stable Respiratory status: spontaneous breathing, nonlabored ventilation, respiratory function stable and patient connected to face mask oxygen Cardiovascular status: stable Postop Assessment: no apparent nausea or vomiting Anesthetic complications: no     Last Vitals:  Vitals:   09/29/18 0831 09/29/18 1006  BP: (!) 168/95 139/72  Pulse: 94 77  Resp: 18   Temp: 36.7 C 36.8 C  SpO2: 97% (P) 100%    Last Pain:  Vitals:   09/29/18 0831  TempSrc: Oral  PainSc: 0-No pain                 Devaeh Amadi

## 2018-09-29 NOTE — TOC Progression Note (Addendum)
Transition of Care [facility]) - Progression Note    Patient Details  Name: OSIAS RESNICK MRN: [id number] Date of Birth: 01/28/1951  Transition of Care [facility]) CM/SW Contact  Roda Shutters Margretta Sidle, RN Phone Number: 09/29/2018, 2:24 PM  Clinical Narrative:   S/p colostomy placement. Pt has had ostomy in the past. States he is comfortable caring for it. His wife will be able to obtain supplies. [place] nursing for follow up offered but pt has declined. CM will refer for [facility] f/u at time of DC.  Pt will no longer need to DC with [facility].       Expected Discharge Plan: Home/Self Care    Expected Discharge Plan and Services Expected Discharge Plan: Home/Self Care       Living arrangements for the past 2 months: Single Family Home                    HH Arranged: Refused HH     Readmission Risk Interventions Readmission Risk Prevention Plan 09/24/2018  Transportation Screening Complete  HRI or Home Care Consult Complete  Social Work Consult for [place] Planning/Counseling Complete  Palliative Care Screening Not Applicable  Medication Review [employer] photographer) Complete  Some recent data might be hidden

## 2018-09-30 ENCOUNTER — Encounter (HOSPITAL_COMMUNITY): Payer: Self-pay | Admitting: General Surgery

## 2018-09-30 LAB — CULTURE, BLOOD (ROUTINE X 2)
Culture: NO GROWTH
Culture: NO GROWTH
Special Requests: ADEQUATE
Special Requests: ADEQUATE

## 2018-09-30 LAB — CBC
HCT: 34.2 % — ABNORMAL LOW (ref 39.0–52.0)
Hemoglobin: 11.1 g/dL — ABNORMAL LOW (ref 13.0–17.0)
MCH: 32.4 pg (ref 26.0–34.0)
MCHC: 32.5 g/dL (ref 30.0–36.0)
MCV: 99.7 fL (ref 80.0–100.0)
Platelets: 224 10*3/uL (ref 150–400)
RBC: 3.43 MIL/uL — ABNORMAL LOW (ref 4.22–5.81)
RDW: 13.7 % (ref 11.5–15.5)
WBC: 6.4 10*3/uL (ref 4.0–10.5)
nRBC: 0 % (ref 0.0–0.2)

## 2018-09-30 LAB — GLUCOSE, CAPILLARY
Glucose-Capillary: 110 mg/dL — ABNORMAL HIGH (ref 70–99)
Glucose-Capillary: 155 mg/dL — ABNORMAL HIGH (ref 70–99)
Glucose-Capillary: 109 mg/dL — ABNORMAL HIGH (ref 70–99)
Glucose-Capillary: 123 mg/dL — ABNORMAL HIGH (ref 70–99)

## 2018-09-30 LAB — BASIC METABOLIC PANEL
Anion gap: 9 (ref 5–15)
BUN: 7 mg/dL — ABNORMAL LOW (ref 8–23)
CO2: 25 mmol/L (ref 22–32)
Calcium: 8 mg/dL — ABNORMAL LOW (ref 8.9–10.3)
Chloride: 103 mmol/L (ref 98–111)
Creatinine, Ser: 0.65 mg/dL (ref 0.61–1.24)
GFR calc Af Amer: >60 mL/min (ref >60)
GFR calc non Af Amer: >60 mL/min (ref >60)
Glucose, Bld: 130 mg/dL — ABNORMAL HIGH (ref 70–99)
Potassium: 4 mmol/L (ref 3.5–5.1)
Sodium: 137 mmol/L (ref 135–145)

## 2018-09-30 LAB — MAGNESIUM: Magnesium: 1.7 mg/dL (ref 1.7–2.4)

## 2018-09-30 LAB — PHOSPHORUS: Phosphorus: 3.7 mg/dL (ref 2.5–4.6)

## 2018-09-30 MED ORDER — RIVAROXABAN 20 MG PO TABS
20.0000 mg | ORAL_TABLET | Freq: Every day | ORAL | Status: DC
  Administered 2018-09-30: 20 mg via ORAL
  Filled 2018-09-30: qty 1

## 2018-09-30 NOTE — TOC Progression Note (Signed)
Transition of Care [facility]) - Progression Note    Patient Details  Name: BAPTISTE LITTLER MRN: [id number] Date of Birth: [birth year]  Transition of Care [facility]) CM/SW Contact  Roda Shutters Margretta Sidle, RN Phone Number: 09/30/2018, 4:30 PM  Clinical Narrative:   CM contacted by [facility] nurse. Pt will DC Friday with complicated colostomy care. Pt now agreeable to [facility] nursing for support over weekend.    Expected Discharge Plan: [place]    Expected Discharge Plan and Services Expected Discharge Plan: [place] Choice: [place] arrangements for the past 2 months: [place]: RN [facility] Agency: [place] ([place]) Date [place]: 09/30/18 Time [place]: 1629 Representative spoke with at [place]: Romualdo Bolk  Readmission Risk Interventions Readmission Risk Prevention Plan 09/24/2018  Transportation Screening Complete  HRI or [place] Complete  Social Work Consult for [place] Planning/Counseling [place] Not Applicable  Medication Review Press photographer) Complete  Some recent data might be hidden

## 2018-09-30 NOTE — Consult Note (Signed)
[place] Nurse ostomy consult note Stoma type/location: LUQ, loop transverse colostomy Stomal assessment/size: 2 1/4" slightly irregularly shaped, edematous. Plastic support rod in place until next Thursday at surgery follow up appointment Peristomal assessment: intact  Today the pouch from the OR was a challenge to remove, the rod was on top of the wafer and there was not space to manipulate the rod to remove wafer. I had to cut the wafer, flange in 1/2 and remove essentially in a C shape.  Stoma is large as expected for the patient's current circumstances, explained this to patient and his wife Treatment options for stomal/peristomal skin: today I placed 1/2 of 2" barrier ring over the rod on each side Output formed brown stool and bloody drainage  Ostomy pouching: 2pc. 4" post op pouch used, that is what is available for use in the hospital  Will need large wafer to accommodate rod and stoma until support rod is removed. Previously patient and his wife had purchased supplies from DME store in [place]. They will not have these pouches in stock for them to purchase.   Education provided:  Explained role of ostomy nurse and creation of stoma. Reason for size, difference between ileostomy (which patient had previously) and colostomy and even difference of loop colostomy with support rod Wife and patient are very anxious about support rod. Explained stoma characteristics (budded, flush, color, texture, care)  Demonstrated pouch change (cutting new skin barrier- patient was able to cut skin barrier.  measuring stoma, cleaning peristomal skin and stoma, use of barrier ring over support rod) Education on emptying when 1/3 to 1/2 full and how to empty-patient is aware Discussed bathing, diet, gas, medication use, constipation   Answered patient/family questions:   Discussed need for [place] nurse to assess patient in the am to determine if current pouching will work at home until support rod. Suggested patient  have [place] since weekend is approaching. Patient agreeable to am visit and then DC with [facility].  [place] nurse will explore options for pouching and provide supplies at DC. Feel that once support rod has been removed will be able to go into 1pc as patient was familiar with the use of the previously. I will bring this to the room as well in the am to discuss and make pattern for patient and wife.  Notified [facility] nurse of pending admission.   Enrolled patient in [place] program: Yes  [place] Nurse will follow along with you for continued support with ostomy teaching and care [place] MSN, [place], [place], [place], [place]

## 2018-09-30 NOTE — Progress Notes (Signed)
1 Day Post-Op  Subjective: Patient has no complaints.  Is pleased that his diarrhea has resolved.  Colostomy bag has been changed several times.  Objective: Vital signs in last 24 hours: Temp:  [97.6 F (36.4 C)-99 F (37.2 C)] 98.5 F (36.9 C) (05/28 7322) Pulse Rate:  [77-94] 86 (05/28 0614) Resp:  [16-23] 20 (05/28 0614) BP: (128-150)/(72-84) 128/80 (05/28 0614) SpO2:  [94 %-100 %] 97 % (05/28 0811) Last BM Date: 09/28/18  Intake/Output from previous day: 05/27 0701 - 05/28 0700 In: 1195.7 [P.O.:240; I.V.:955.7] Out: 965 [Urine:940; Blood:25] Intake/Output this shift: No intake/output data recorded.  General appearance: alert, cooperative and no distress GI: Soft.  Colostomy pink and patent.  Lab Results:  Recent Labs    09/29/18 0522 09/30/18 0530  WBC 5.8 6.4  HGB 11.9* 11.1*  HCT 36.0* 34.2*  PLT 240 224   BMET Recent Labs    09/29/18 0522 09/30/18 0530  NA 139 137  K 3.4* 4.0  CL 102 103  CO2 23 25  GLUCOSE 139* 130*  BUN 7* 7*  CREATININE 0.69 0.65  CALCIUM 8.4* 8.0*   PT/INR No results for input(s): LABPROT, INR in the last 72 hours.  Studies/Results: No results found.  Anti-infectives: Anti-infectives (From admission, onward)   Start     Dose/Rate Route Frequency Ordered Stop   09/29/18 0600  ertapenem (INVANZ) 1,000 mg in sodium chloride 0.9 % 100 mL IVPB     1 g 200 mL/hr over 30 Minutes Intravenous On call to O.R. 09/28/18 1921 09/29/18 0926      Assessment/Plan: s/p Procedure(s): TRANSVERSE LOOP COLOSTOMY Impression: Stable on postoperative day 1.  Once patient has received instructions on care of the colostomy, he may be discharged home.  LOS: 5 days    Aviva Signs 09/30/2018

## 2018-09-30 NOTE — Progress Notes (Signed)
Patient Demographics:    Charles [name], is a 68 y.o. male, DOB - 1950/08/16, QKM:[id number]  Admit date - 09/22/2018   Admitting Physician Reubin Milan, MD  Outpatient Primary MD for the patient is [name], MD  LOS - 5   Chief Complaint  Patient presents with   Emesis        Subjective:    Charles [name] today has no fevers, no emesis,  No chest pain,   tolerating oral intake well, having output in his colostomy bag  Assessment  & Plan :    Principal Problem:   Diarrhea in adult patient Active Problems:   GERD   Peripheral neuropathy due to oxaliplatin-chemotherapy   Diarrhea   DVT (deep venous thrombosis) (HCC)   Colon cancer ([place])   History of pulmonary embolism   Hypokalemia   Type 2 diabetes mellitus ([place])   Hypomagnesemia   Dilated gallbladder   Abnormal CT scan, colon   Anastomotic stricture of colorectal region   Metastatic colorectal cancer [facility])  Brief summary 68 year old white male with anxiety, depression, type 2 diabetes, GERD, history of high output ileostomy, history of urolithiasis, hypertension, history of metastatic colorectal cancer, history of proctectomy, radiation therapy and chemotherapy (last cycle about 6 weeks ago), DVTs, history of pulmonary embolism on Xarelto admitted 09/22/18 department due to persistent diarrhea for the past 4 days and worsening colorectal stricture. S/p palliative transverse loop colostomy on 09/29/18  A/p  Diarrhea inimmunocompromised patientwith EPEC infection and stenotic anastomosis noted on flex sig 09/24/18 Recommendations for no antibiotics at this time S/p palliative/diverting colostomy on 09/29/18--- tolerating diet well, having output in his colostomy bag, ostomy not currently does not have the appropriate ostomy bag for patient.... Plan is to obtain appropriate ostomy bag for patient on 10/01/2018 and have patient and  his wife learn how to change them with redemonstration after teaching and then possibly discharge home on 10/01/2018 Discussed with general surgeon Dr. Arnoldo Morale  Active Problems: Dilated gallbladder-incidental finding-- Patient is currently asymptomatic, Antiemetics as needed, Analgesics as needed. No signs of acute cholecystitis noted, no LFT elevation noted  Type 2 diabetes mellitus (HCC) Blood glucose stable Amaryl is on hold CBG monitoring with sensitive sliding scale until oral intake is more reliable  Metastatic colon cancer - Follow-up with oncology as scheduledwith Dr. [name]  Hypokalemia/hypomagnesemia----replace and recheck  GERD Continue PPI.  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 twice daily.  DVT (deep venous thrombosis)/History of pulmonary embolism-- Patient received heparin bridge preop,  restart Xarelto anticoagulation on  09/30/2018   DVT prophylaxis:Restart Xarelto Code Status:Full Family Communication:Discussed with wife  Disposition Plan:Per general surgery in 1 to 2 days if tolerating oral intake Consultants:  GI  GS  Procedures:  Diverting/palliative colostomy Disposition/Need for in-Hospital Stay- patient unable to be discharged at this time due to need to watch for possible bleeding after restarting Xarelto and also Plan is to obtain appropriate ostomy bag for patient on 10/01/2018 and have patient and his wife learn how to change them with redemonstration after teaching and then possibly discharge home on 10/01/2018    Lab Results  Component Value Date   PLT 224 09/30/2018    Inpatient Medications  Scheduled Meds:  Chlorhexidine  Gluconate Cloth  6 each Topical Daily   diphenoxylate-atropine  1 tablet Oral BID WC   enoxaparin (LOVENOX) injection  40 mg Subcutaneous Q24H   feeding supplement (GLUCERNA SHAKE)  237 mL Oral TID BM   gabapentin  900 mg Oral  BID   insulin aspart  0-9 Units Subcutaneous TID WC   mupirocin ointment  1 application Nasal BID   ondansetron  4 mg Intravenous Once   pantoprazole  40 mg Oral Daily   traZODone  100 mg Oral QHS   Continuous Infusions:  sodium chloride 50 mL/hr at 09/30/18 0348   PRN Meds:.acetaminophen, clonazePAM, HYDROmorphone (DILAUDID) injection, [COMPLETED] ketorolac **FOLLOWED BY** ketorolac, LORazepam, ondansetron, oxyCODONE-acetaminophen **AND** oxyCODONE, prochlorperazine, simethicone    Anti-infectives (From admission, onward)   Start     Dose/Rate Route Frequency Ordered Stop   09/29/18 0600  ertapenem (INVANZ) 1,000 mg in sodium chloride 0.9 % 100 mL IVPB     1 g 200 mL/hr over 30 Minutes Intravenous On call to O.R. 09/28/18 1921 09/29/18 0926        Objective:   Vitals:   09/30/18 0209 09/30/18 8366 09/30/18 0811 09/30/18 1006  BP: 139/81 128/80  (!) 144/80  Pulse: 94 86    Resp: 20 20  16   Temp: 98.9 F (37.2 C) 98.5 F (36.9 C)  98.6 F (37 C)  TempSrc: Oral Oral  Oral  SpO2: 95% 98% 97% 97%  Weight:      Height:        Wt Readings from Last 3 Encounters:  09/26/18 87.5 kg  09/20/18 87.8 kg  09/16/18 89.8 kg     Intake/Output Summary (Last 24 hours) at 09/30/2018 1400 Last data filed at 09/30/2018 0900 Gross per 24 hour  Intake 395.73 ml  Output 1340 ml  Net -944.27 ml     Physical Exam Patient is examined daily including today on 09/30/18 , exams remain the same as of yesterday except that has changed   Gen:- Awake Alert,  In no apparent distress  HEENT:- [place].AT, No sclera icterus Neck-Supple Neck,No JVD,.  Lungs-  CTAB , fair symmetrical air movement CV- S1, S2 normal, regular  Abd-  +ve B.Sounds, Abd Soft, colostomy bag with mixed liquid and somewhat formed contents  extremity/Skin:- No  edema, pedal pulses present  Psych-affect is appropriate, oriented x3 Neuro-no new focal deficits, no tremors   Data Review:   Micro Results Recent Results  (from the past 240 hour(s))  Gastrointestinal Panel by PCR , Stool     Status: Abnormal   Collection Time: 09/22/18  4:04 PM  Result Value Ref Range Status   Campylobacter species NOT DETECTED NOT DETECTED Final   Plesimonas shigelloides NOT DETECTED NOT DETECTED Final   Salmonella species NOT DETECTED NOT DETECTED Final   Yersinia enterocolitica NOT DETECTED NOT DETECTED Final   Vibrio species NOT DETECTED NOT DETECTED Final   Vibrio cholerae NOT DETECTED NOT DETECTED Final   Enteroaggregative E coli (EAEC) NOT DETECTED NOT DETECTED Final   Enteropathogenic E coli (EPEC) DETECTED (A) NOT DETECTED Final    Comment: CRITICAL RESULT CALLED TO, READ BACK BY AND VERIFIED WITH: CALLED TO DANA COX @1442  09/23/2018 SAC    Enterotoxigenic E coli (ETEC) NOT DETECTED NOT DETECTED Final   Shiga like toxin producing E coli (STEC) NOT DETECTED NOT DETECTED Final   Shigella/Enteroinvasive E coli (EIEC) NOT DETECTED NOT DETECTED Final   Cryptosporidium NOT DETECTED NOT DETECTED Final   Cyclospora cayetanensis NOT DETECTED NOT DETECTED  Final   Entamoeba histolytica NOT DETECTED NOT DETECTED Final   Giardia lamblia NOT DETECTED NOT DETECTED Final   Adenovirus F40/41 NOT DETECTED NOT DETECTED Final   Astrovirus NOT DETECTED NOT DETECTED Final   Norovirus GI/GII NOT DETECTED NOT DETECTED Final   Rotavirus A NOT DETECTED NOT DETECTED Final   Sapovirus (I, II, IV, and V) NOT DETECTED NOT DETECTED Final    Comment: Performed at [facility], [address] [place], [place] [address]  C difficile quick scan w PCR reflex     Status: None   Collection Time: 09/22/18  4:05 PM  Result Value Ref Range Status   C Diff antigen NEGATIVE NEGATIVE Final   C Diff toxin NEGATIVE NEGATIVE Final   C Diff interpretation No C. difficile detected.  Final    Comment: VALID Performed at [facility], [address], [place], [place] [address]   SARS Coronavirus 2 (CEPHEID - Performed in [facility]  lab), Hosp Order     Status: None   Collection Time: 09/22/18  7:46 PM  Result Value Ref Range Status   SARS Coronavirus 2 NEGATIVE NEGATIVE Final    Comment: (NOTE) If result is NEGATIVE SARS-CoV-2 target nucleic acids are NOT DETECTED. The SARS-CoV-2 RNA is generally detectable in upper and lower  respiratory specimens during the acute phase of infection. The lowest  concentration of SARS-CoV-2 viral copies this assay can detect is 250  copies / mL. A negative result does not preclude SARS-CoV-2 infection  and should not be used as the sole basis for treatment or other  patient management decisions.  A negative result may occur with  improper specimen collection / handling, submission of specimen other  than nasopharyngeal swab, presence of viral mutation(s) within the  areas targeted by this assay, and inadequate number of viral copies  (<250 copies / mL). A negative result must be combined with clinical  observations, patient history, and epidemiological information. If result is POSITIVE SARS-CoV-2 target nucleic acids are DETECTED. The SARS-CoV-2 RNA is generally detectable in upper and lower  respiratory specimens dur ing the acute phase of infection.  Positive  results are indicative of active infection with SARS-CoV-2.  Clinical  correlation with patient history and other diagnostic information is  necessary to determine patient infection status.  Positive results do  not rule out bacterial infection or co-infection with other viruses. If result is PRESUMPTIVE POSTIVE SARS-CoV-2 nucleic acids MAY BE PRESENT.   A presumptive positive result was obtained on the submitted specimen  and confirmed on repeat testing.  While 2019 novel coronavirus  (SARS-CoV-2) nucleic acids may be present in the submitted sample  additional confirmatory testing may be necessary for epidemiological  and / or clinical management purposes  to differentiate between  SARS-CoV-2 and other Sarbecovirus  currently known to infect humans.  If clinically indicated additional testing with an alternate test  methodology [phone]) is advised. The SARS-CoV-2 RNA is generally  detectable in upper and lower respiratory sp ecimens during the acute  phase of infection. The expected result is Negative. Fact Sheet for Patients:  StrictlyIdeas.no Fact Sheet for Healthcare Providers: BankingDealers.co.za This test is not yet approved or cleared by the [place] FDA and has been authorized for detection and/or diagnosis of SARS-CoV-2 by FDA under an Emergency Use Authorization (EUA).  This EUA will remain in effect (meaning this test can be used) for the duration of the COVID-19 declaration under Section 564(b)(1) of the Act, 21 U.S.C. section  360bbb-3(b)(1), unless the authorization is terminated or revoked sooner. Performed at [facility], [address] [place], [place] [address]   Culture, blood (routine x 2)     Status: None   Collection Time: 09/22/18 11:32 PM  Result Value Ref Range Status   Specimen Description BLOOD RIGHT ANTECUBITAL  Final   Special Requests   Final    BOTTLES DRAWN AEROBIC AND ANAEROBIC Blood Culture adequate volume   Culture   Final    NO GROWTH 5 DAYS Performed at [facility], [address] [place], [place] [address]    Report Status 09/27/2018 FINAL  Final  Culture, blood (routine x 2)     Status: None   Collection Time: 09/22/18 11:39 PM  Result Value Ref Range Status   Specimen Description BLOOD RIGHT HAND  Final   Special Requests   Final    BOTTLES DRAWN AEROBIC AND ANAEROBIC Blood Culture adequate volume   Culture   Final    NO GROWTH 5 DAYS Performed at [facility], [address] [place], [place] [address]    Report Status 09/27/2018 FINAL  Final  Culture, blood (routine x 2)     Status: None   Collection Time: 09/25/18  4:16 PM  Result Value Ref Range Status   Specimen Description RIGHT  ANTECUBITAL  Final   Special Requests   Final    BOTTLES DRAWN AEROBIC AND ANAEROBIC Blood Culture adequate volume   Culture   Final    NO GROWTH 5 DAYS Performed at [facility], [address] [place], [place] [address]    Report Status 09/30/2018 FINAL  Final  Culture, blood (routine x 2)     Status: None   Collection Time: 09/25/18  4:16 PM  Result Value Ref Range Status   Specimen Description BLOOD RIGHT ARM  Final   Special Requests   Final    BOTTLES DRAWN AEROBIC AND ANAEROBIC Blood Culture adequate volume   Culture   Final    NO GROWTH 5 DAYS Performed at [facility], [address] [place], [place] [address]    Report Status 09/30/2018 FINAL  Final  Surgical pcr screen     Status: Abnormal   Collection Time: 09/28/18  7:20 PM  Result Value Ref Range Status   MRSA, PCR NEGATIVE NEGATIVE Final   Staphylococcus aureus POSITIVE (A) NEGATIVE Final    Comment: RESULT CALLED TO, READ BACK BY AND VERIFIED WITH: THOMAS,C ON 09/28/18 AT 2145 BY LOY,C (NOTE) The Xpert SA Assay (FDA approved for NASAL specimens in patients 65 years of age and older), is one component of a comprehensive surveillance program. It is not intended to diagnose infection nor to guide or monitor treatment. Performed at [facility], [address] [place],  [address]     Radiology Reports Dg Chest 2 View  Result Date: 09/22/2018 CLINICAL DATA:  Diarrhea central chest pain EXAM: CHEST - 2 VIEW COMPARISON:  03/22/2018, CT chest 07/30/2018 FINDINGS: Right-sided central venous port tip over the SVC. Chronic elevation of right diaphragm with scarring at the right middle lobe. CT demonstrated right upper lobe lung nodules not well seen. Stable cardiomediastinal silhouette. No pneumothorax. IMPRESSION: No active cardiopulmonary disease. Stable right middle lobe scarring. Electronically Signed   By: Donavan Foil M.D.   On: 09/22/2018 16:59   Ct Abdomen Pelvis W Contrast  Result Date:  09/22/2018 CLINICAL DATA:  Stage IV rectal cancer with pancreatic metastases. Status post common bile duct stent placement. EXAM: CT ABDOMEN  AND PELVIS WITH CONTRAST TECHNIQUE: Multidetector CT imaging of the abdomen and pelvis was performed using the standard protocol following bolus administration of intravenous contrast. CONTRAST:  161mL OMNIPAQUE IOHEXOL 300 MG/ML  SOLN COMPARISON:  MRI 09/10/2018.  CT scan 07/30/2018. FINDINGS: Lower chest: The heart size is normal. No substantial pericardial effusion. Coronary artery calcification is evident. Stable 7 mm anterior right lung nodule. Atelectasis noted lower lobes bilaterally. Hepatobiliary: No suspicious focal abnormality within the liver parenchyma. Gallbladder is distended with ill-defined gallbladder wall suggesting edema. Pneumobilia is compatible with the presence of the common bile duct stent. Pancreas: Diffuse pancreatic parenchymal atrophy with associated dilatation of the main pancreatic duct. Imaging features similar to prior with abrupt cut off at the level of the pancreatic head. 2.0 cm hypoenhancing lesion in the posterior pancreatic head is similar to prior MRI when it was measured at 2.2 cm. Spleen: No splenomegaly. No focal mass lesion. Adrenals/Urinary Tract: Stable adrenal glands bilaterally without mass lesion. Kidneys unremarkable. No evidence for hydroureter. The urinary bladder appears normal for the degree of distention. Stomach/Bowel: Stomach is distended with fluid and food material. Duodenum is normally positioned as is the ligament of Treitz. No small bowel wall thickening. No small bowel dilatation. No small bowel wall thickening. No small bowel dilatation. The terminal ileum is normal. Appendicoliths noted in the tip of the appendix without periappendiceal edema or inflammation. Anastomotic staple line noted at the rectosigmoid junction with circumferential wall thickening in the rectum, progressed since 07/30/2018.  Vascular/Lymphatic: There is abdominal aortic atherosclerosis without aneurysm. IVC filter identified in situ. Stranding in the hepato duodenal ligament may be related to edema. There is no gastrohepatic or hepatoduodenal ligament lymphadenopathy. No intraperitoneal or retroperitoneal lymphadenopathy. The 8 mm short axis celiac axis lymph node is stable and upper normal for size. Similar upper normal lymph nodes are seen in the portal caval space. No pelvic sidewall lymphadenopathy. Reproductive: The prostate gland and seminal vesicles are unremarkable. Other: No intraperitoneal free fluid. Musculoskeletal: Bilateral groin hernias contain only fat. No worrisome lytic or sclerotic osseous abnormality. IMPRESSION: 1. Marked interval increase in circumferential wall thickening in the rectum, adjacent to the anastomosis. Recurrent disease a distinct concern. 2. Stomach is distended with fluid and food material. No associated small bowel dilatation. No obstructing lesion to suggest gastric outlet obstruction by CT. 3. Gallbladder is distended with apparent gallbladder wall edema. Cholecystitis a concern by imaging. 4. Stable appearance of the posterior pancreatic head lesion with diffuse pancreatic parenchymal atrophy and chronic dilatation of the main pancreatic duct. By report, the pancreatic head lesion represents metastatic colorectal cancer. 5. Stable upper normal lymph nodes in the celiac axis. 6.  Aortic Atherosclerois (ICD10-170.0) 7. Stable anterior right upper lobe pulmonary nodule. Electronically Signed   By: Misty Stanley M.D.   On: 09/22/2018 17:30   Mr 3d Recon At Scanner  Result Date: 09/10/2018 CLINICAL DATA:  Right upper quadrant abdominal pain. 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 EXAM: MRI ABDOMEN WITHOUT AND WITH CONTRAST (INCLUDING MRCP) TECHNIQUE: Multiplanar multisequence MR imaging of the abdomen was performed both before and after the administration of intravenous  contrast. Heavily T2-weighted images of the biliary and pancreatic ducts were obtained, and three-dimensional MRCP images were rendered by post processing. CONTRAST:  9 mL Gadovist IV COMPARISON:  Right upper quadrant ultrasound dated 09/10/2018 FINDINGS: Lower chest: Lungs are essentially clear in this patient with known pulmonary nodules on prior CT. Hepatobiliary: Liver is within normal limits. No suspicious/enhancing hepatic lesions.  Mildly distended gallbladder.  No layering gallstones. Mild intrahepatic ductal dilatation in the left hepatic lobe (series 5006/image 19). Common duct measures 8 mm, mildly prominent. No choledocholithiasis is seen. Pancreas: Chronic dilatation of the main pancreatic duct, measuring up to 11 mm, with associated atrophy of the pancreatic body/tail. Underlying 1.3 x 2.1 cm obstructing mass in the pancreatic head, previously 1.7 cm on CT. Spleen:  Within normal limits. Adrenals/Urinary Tract:  Adrenal glands are within normal limits. Kidneys are notable for bilateral renal sinus cysts. No hydronephrosis. Stomach/Bowel: Stomach is within normal limits. Visualized bowel is unremarkable. Vascular/Lymphatic:  No evidence of abdominal aortic aneurysm. No suspicious abdominal lymphadenopathy. Other:  No abdominal ascites. Musculoskeletal: No focal osseous lesions. IMPRESSION: 1.3 x 2.1 cm mass in the pancreatic head, mildly increased. Reportedly, this reflects a pancreatic metastasis, although primary pancreatic neoplasm could have a similar appearance. Associated chronic dilatation of the main pancreatic duct with atrophy of the pancreatic body/tail. Secondary mild intrahepatic and extrahepatic ductal dilatation. Common duct measures 8 mm. No choledocholithiasis is seen. Electronically Signed   By: Julian Hy M.D.   On: 09/10/2018 18:07   Dg Chest Port 1 View  Result Date: 09/25/2018 CLINICAL DATA:  Diarrhea.  History of cancer and diabetes. EXAM: PORTABLE CHEST 1 VIEW  COMPARISON:  Radiographs 09/22/2018.  CT 07/30/2018. FINDINGS: 1608 hours. Mild patient rotation to the right. Right subclavian Port-A-Cath appears unchanged at the lower SVC level. The heart size and mediastinal contours are stable. There is stable opacity at the right lung base and mild right lung nodularity. The left lung is clear. There is no pleural effusion or pneumothorax. IMPRESSION: Stable chest without acute findings. Electronically Signed   By: Richardean Sale M.D.   On: 09/25/2018 16:29   Dg Ercp  Result Date: 09/16/2018 CLINICAL DATA:  Obstructive jaundice. ERCP with balloon dilatation and biliary stent placement. EXAM: ERCP TECHNIQUE: Multiple spot images obtained with the fluoroscopic device and submitted for interpretation post-procedure. COMPARISON:  MRCP-09/10/2018; CT abdomen pelvis-07/30/2018 FLUOROSCOPY TIME:  2 minutes, 48 seconds FINDINGS: Six spot fluoroscopic images of the right upper abdominal quadrant during ERCP are provided for review Initial image demonstrates an ERCP probe overlying the right upper abdominal quadrant. Incidentally noted IVC filter. There is selective cannulation and opacification of the common bile duct which appears to demonstrate malignant narrowing/occlusion of its distal aspect. Subsequent images demonstrate balloon dilatation of the distal aspect of the CBD. Completion image demonstrates placement of an internal biliary stent overlying the expected location of the mid and distal aspect of the CBD. IMPRESSION: ERCP with biliary dilatation and stent placement as detailed above. These images were submitted for radiologic interpretation only. Please see the procedural report for the amount of contrast and the fluoroscopy time utilized. Electronically Signed   By: Sandi Mariscal M.D.   On: 09/16/2018 14:38   Mr Abdomen Mrcp Moise Boring Contast  Result Date: 09/10/2018 CLINICAL DATA:  Right upper quadrant abdominal pain. History of stage IV rectal cancer with pancreatic  metastasis. EXAM: MRI ABDOMEN WITHOUT AND WITH CONTRAST (INCLUDING MRCP) TECHNIQUE: Multiplanar multisequence MR imaging of the abdomen was performed both before and after the administration of intravenous contrast. Heavily T2-weighted images of the biliary and pancreatic ducts were obtained, and three-dimensional MRCP images were rendered by post processing. CONTRAST:  9 mL Gadovist IV COMPARISON:  Right upper quadrant ultrasound dated 09/10/2018 FINDINGS: Lower chest: Lungs are essentially clear in this patient with known pulmonary nodules on prior CT. Hepatobiliary: Liver is within normal  limits. No suspicious/enhancing hepatic lesions. Mildly distended gallbladder.  No layering gallstones. Mild intrahepatic ductal dilatation in the left hepatic lobe (series 5006/image 19). Common duct measures 8 mm, mildly prominent. No choledocholithiasis is seen. Pancreas: Chronic dilatation of the main pancreatic duct, measuring up to 11 mm, with associated atrophy of the pancreatic body/tail. Underlying 1.3 x 2.1 cm obstructing mass in the pancreatic head, previously 1.7 cm on CT. Spleen:  Within normal limits. Adrenals/Urinary Tract:  Adrenal glands are within normal limits. Kidneys are notable for bilateral renal sinus cysts. No hydronephrosis. Stomach/Bowel: Stomach is within normal limits. Visualized bowel is unremarkable. Vascular/Lymphatic:  No evidence of abdominal aortic aneurysm. No suspicious abdominal lymphadenopathy. Other:  No abdominal ascites. Musculoskeletal: No focal osseous lesions. IMPRESSION: 1.3 x 2.1 cm mass in the pancreatic head, mildly increased. Reportedly, this reflects a pancreatic metastasis, although primary pancreatic neoplasm could have a similar appearance. Associated chronic dilatation of the main pancreatic duct with atrophy of the pancreatic body/tail. Secondary mild intrahepatic and extrahepatic ductal dilatation. Common duct measures 8 mm. No choledocholithiasis is seen. Electronically  Signed   By: Julian Hy M.D.   On: 09/10/2018 18:07   US Abdomen Limited Ruq  Result Date: 09/10/2018 CLINICAL DATA:  Right upper quadrant abdominal pain for 1 year. History rectal cancer. EXAM: ULTRASOUND ABDOMEN LIMITED RIGHT UPPER QUADRANT COMPARISON:  07/30/2018 CT abdomen/pelvis. FINDINGS: Gallbladder: Small amount of layering sludge in mildly distended gallbladder. Mild diffuse gallbladder wall thickening (4 mm gallbladder wall thickness). No cholelithiasis. No sonographic Murphy sign. No pericholecystic fluid. Common bile duct: Diameter: 7 mm Liver: Liver parenchyma is diffusely mildly echogenic. No liver mass detected, noting decreased sensitivity in the setting of an echogenic liver. Portal vein is patent on color Doppler imaging with normal direction of blood flow towards the liver. IMPRESSION: 1. Mildly distended gallbladder. No cholelithiasis. Layering gallbladder sludge. No pericholecystic fluid. No sonographic Murphy's sign. Findings are nonspecific, most likely chronic. If there is clinical concern for acute acalculous cholecystitis, hepatobiliary scintigraphy study could be obtained for further evaluation. 2. Mildly dilated common bile duct (7 mm diameter), slightly increased from 07/30/2018 CT abdomen study. A cause of biliary obstruction is not identified on this ultrasound study. Recommend correlation with serum bilirubin levels. MRI abdomen with MRCP without and with IV contrast could be obtained for further evaluation as clinically warranted. 3. Nonspecific echogenic liver parenchyma, which could be due to hepatic steatosis and/or fibrosis. No liver masses, noting decreased sensitivity in the setting of an echogenic liver. Electronically Signed   By: Ilona Sorrel M.D.   On: 09/10/2018 12:41     CBC Recent Labs  Lab 09/25/18 0705 09/26/18 0421 09/27/18 0430 09/29/18 0522 09/30/18 0530  WBC 10.2 7.7 7.1 5.8 6.4  HGB 11.7* 11.4* 11.8* 11.9* 11.1*  HCT 35.4* 34.2* 35.1* 36.0*  34.2*  PLT 184 211 217 240 224  MCV 99.7 99.1 98.9 98.1 99.7  MCH 33.0 33.0 33.2 32.4 32.4  MCHC 33.1 33.3 33.6 33.1 32.5  RDW 14.0 14.0 13.8 13.7 13.7    Chemistries  Recent Labs  Lab 09/24/18 0500 09/25/18 0705 09/26/18 0421 09/27/18 0430 09/28/18 0653 09/29/18 0522 09/30/18 0530  NA 134* 134* 134* 135 135 139 137  K 3.7 3.3* 3.9 3.9 4.3 3.4* 4.0  CL 103 100 102 102 101 102 103  CO2 24 24 24 23 24 23 25   GLUCOSE 178* 94 116* 113* 117* 139* 130*  BUN 11 12 10 10 9  7* 7*  CREATININE 0.68 0.64  0.65 0.67 0.68 0.69 0.65  CALCIUM 8.1* 7.9* 7.8* 7.9* 8.2* 8.4* 8.0*  MG 1.6* 1.7 1.7  --   --   --  1.7   ------------------------------------------------------------------------------------------------------------------    Component Value Date/Time   BNP 37.0 09/22/2018 1529   Charles [name] M.D on 09/30/2018 at 2:00 PM  Go to [email or web address] - for contact info  [facility] - Office  [phone]

## 2018-10-01 ENCOUNTER — Ambulatory Visit (HOSPITAL_COMMUNITY): Payer: Medicare Other

## 2018-10-01 ENCOUNTER — Other Ambulatory Visit (HOSPITAL_COMMUNITY): Payer: Medicare Other

## 2018-10-01 ENCOUNTER — Ambulatory Visit (HOSPITAL_COMMUNITY): Payer: Medicare Other | Admitting: Hematology

## 2018-10-01 LAB — CBC
HCT: 34.3 % — ABNORMAL LOW (ref 39.0–52.0)
Hemoglobin: 11.2 g/dL — ABNORMAL LOW (ref 13.0–17.0)
MCH: 32.2 pg (ref 26.0–34.0)
MCHC: 32.7 g/dL (ref 30.0–36.0)
MCV: 98.6 fL (ref 80.0–100.0)
Platelets: 238 K/uL (ref 150–400)
RBC: 3.48 MIL/uL — ABNORMAL LOW (ref 4.22–5.81)
RDW: 13.5 % (ref 11.5–15.5)
WBC: 5.7 K/uL (ref 4.0–10.5)
nRBC: 0 % (ref 0.0–0.2)

## 2018-10-01 LAB — GLUCOSE, CAPILLARY
Glucose-Capillary: 106 mg/dL — ABNORMAL HIGH (ref 70–99)
Glucose-Capillary: 148 mg/dL — ABNORMAL HIGH (ref 70–99)

## 2018-10-01 MED ORDER — ONDANSETRON HCL 8 MG PO TABS: 8.0000 mg | ORAL_TABLET | Freq: Two times a day (BID) | ORAL | 1 refills | Status: DC | PRN

## 2018-10-01 MED ORDER — OXYCODONE-ACETAMINOPHEN 10-325 MG PO TABS: 1.0000 | ORAL_TABLET | Freq: Four times a day (QID) | ORAL | 0 refills | Status: DC | PRN

## 2018-10-01 NOTE — TOC Transition Note (Addendum)
Transition of Care [facility]) - CM/SW Discharge Note   Patient Details  Name: Charles [name] MRN: [id number] Date of Birth: 1950-12-14  [facility]) CM/SW Contact:  Boneta Lucks, RN Phone Number: 10/01/2018, 10:48 AM   Clinical Narrative:       Final next level of care: [place] Barriers to Discharge: No Barriers Identified   Patient Goals and CMS Choice Patient states their goals for this hospitalization and ongoing recovery are:: Home with Home Health CMS Medicare.gov Compare Post Acute Care list provided to:: Patient Choice offered to / list presented to : Spouse  Discharge Placement        Patient will discharge home today with [place]. Advanced notified and orders placed for ostomy care. Follow up appointment with Dr. Arnoldo Morale 10/07/18.                 Discharge Plan and Services     Post Acute Care Choice: Home Health                    HH Arranged: RN [facility] Agency: [place] ([place]) Date [place]: 09/30/18 Time [place]: 1629 Representative spoke with at [place]: [place] ([place]) Interventions     Readmission Risk Interventions Readmission Risk Prevention Plan 09/24/2018  Transportation Screening Complete  HRI or [place] Complete  Social Work Consult for [place] Planning/Counseling Complete  Palliative Care Screening Not Applicable  Medication Review [employer]) Complete  Some recent data might be hidden

## 2018-10-01 NOTE — Consult Note (Signed)
[place] Nurse ostomy follow up Stoma type/location: LUQ, loop transverse colstomy Stomal assessment/size: 2 1/4", budded, pink, moist, edematous Peristomal assessment: NA Treatment options for stomal/peristomal skin: Using 1/2 of 2" barrier ring over the ends of the rod exposed. Then pouching over rod until rod can be removed next week (Thursday) Output brown loose stool Ostomy pouching: 2pc. Post op pouch Education provided:  Education on emptying when 1/3 to 1/2 full and how to empty Demonstrated "burping" flatus from pouch Demonstrated use of wick to clean spout  Discussed bathing, diet, gas, medication use, constipation Discussed risk of peristomal hernia Discussed treatment of peristomal skin (ostomy powder, skin barrier wipes) Answered patient/family questions:  Frequency of pouch change. How to use the barrier rings (as they saw this yesterday) over the support rod. Reinforced teaching on pouch change. Drew new pattern on the back of one piece pouch for them in case they decide to change over once support rod removed.  Pouches 6 (sets) 4 barrier rings 1 clamp (patient may prefer, he used last time). Educational materials in the room   Enrolled patient in [place] Start Discharge program: Yes  Spoke with [facility] WOC nurse as well for report on patient status and supply needs.   [place], [place], [place]

## 2018-10-01 NOTE — Progress Notes (Addendum)
Patient doing well.  Is to receive instruction today by enterostomal therapy nurse.  Okay for discharge today.  I will see patient in my office on 10/07/2018.

## 2018-10-01 NOTE — Discharge Summary (Signed)
Charles [name], is a 68 y.o. male  DOB 28-Jan-1951  MRN [id number].  Admission date:  09/22/2018  Admitting Physician  Reubin Milan, MD  Discharge Date:  10/01/2018   Primary MD  [name], MD  Recommendations for primary care physician for things to follow:  1) you are taking Xarelto the blood thinner so Avoid ibuprofen/Advil/Aleve/Motrin/Goody Powders/Naproxen/BC powders/Meloxicam/Diclofenac/Indomethacin and other Nonsteroidal anti-inflammatory medications as these will make you more likely to bleed and can cause stomach ulcers, can also cause Kidney problems.   2)Follow-up with Dr. Arnoldo Morale the General surgeon on 10/07/2018 for reevaluation and repeat CBC/complete blood count  3)Call if any concerns about bleeding  Admission Diagnosis  Epigastric abdominal pain [R10.13] Diarrhea, unspecified type [R19.7]  Discharge Diagnosis  Epigastric abdominal pain [R10.13] Diarrhea, unspecified type [R19.7]    Principal Problem:   Diarrhea in adult patient Active Problems:   GERD   Peripheral neuropathy due to oxaliplatin-chemotherapy   Diarrhea   DVT (deep venous thrombosis) (HCC)   Colon cancer ([place])   History of pulmonary embolism   Hypokalemia   Type 2 diabetes mellitus ([place])   Hypomagnesemia   Dilated gallbladder   Abnormal CT scan, colon   Anastomotic stricture of colorectal region   Metastatic colorectal cancer ([place])      Past Medical History:  Diagnosis Date   Chronic anticoagulation    Colon cancer ([place]) 07/24/2010   rectal ca, inv adenocarcinoma   Depression 04/01/2011   Diabetes mellitus without complication ([place])    DVT (deep venous thrombosis) ([place]) 05/09/2011   GERD (gastroesophageal reflux disease)    High output ileostomy ([place]) 05/21/2011   History of kidney stones    HTN (hypertension)    Hx of radiation therapy 09/02/10 to 10/14/10   pelvis   Lung metastasis  (HCC)    Neuropathy    Peripheral vascular disease ([place])    dvt's,pe   Pneumonia    hx x3   Pulmonary embolism ([place])    Rectal cancer ([place]) 01/15/2011   S/P radiation and concurrent 5-FU continuous infusion from 09/09/10- 10/10/10.  S/P proctectomy with colorectal anastomosis and diverting loop ileostomy on 11/14/10 at [facility] by Dr. Harlon Ditty. Pathology reveals a pT3b N1 with 3/20 lymph nodes.      Past Surgical History:  Procedure Laterality Date   BILIARY STENT PLACEMENT N/A 09/16/2018   Procedure: STENT PLACEMENT;  Surgeon: Rogene Houston, MD;  [facility];  Service: Endoscopy;  Laterality: N/A;   BIOPSY  09/24/2018   Procedure: BIOPSY;  Surgeon: Danie Binder, MD;  [facility];  Service: Endoscopy;;   COLON SURGERY  11/14/2010   proctectomy with colorectal anastomosis and diverting loop ileostomy (temporary planned)   COLONOSCOPY  07/2010   proximal rectal apple core mass 10-14cm from anal verge (adenocarcinoma), 2-3cm distal rectal carpet polyp s/p piecemeal snare polypectomy (adenoma)   COLONOSCOPY  04/21/2012   RMR: Friable,fibrotic appearing colorectal anastomosis producing some luminal narrowing-not felt to be critical. path: focal erosion with slight inflammation  and hyperemia. SURVEILLANCE DUE DEC 2015   COLONOSCOPY N/A 11/24/2013   Dr. Rourk:somewhat fibrotic/friable anastomotic mucosal-status post biopsy (narrowing not felt to be clinically significant) Single colonic diverticulum. benign polypoid rectal mucosa   colostomy reversal  april 2013   ERCP N/A 09/16/2018   Procedure: ENDOSCOPIC RETROGRADE CHOLANGIOPANCREATOGRAPHY (ERCP);  Surgeon: Rogene Houston, MD;  [facility];  Service: Endoscopy;  Laterality: N/A;   ESOPHAGOGASTRODUODENOSCOPY  07/2010   RMR: schatki ring s/p dilation, small hh, SB bx benign   ESOPHAGOGASTRODUODENOSCOPY N/A 09/04/2015   Procedure: ESOPHAGOGASTRODUODENOSCOPY (EGD);  Surgeon: Daneil Dolin, MD;   [facility];  Service: Endoscopy;  Laterality: N/A;  730   EUS  08/2010   Dr. Owens Loffler. uT3N0 circumferential, nearly obstruction rectosigmoid adenocarcinoma, distal edge 12cm from anal verge   EUS N/A 06/19/2016   Procedure: UPPER ENDOSCOPIC ULTRASOUND (EUS) RADIAL;  Surgeon: Milus Banister, MD;  [facility]: [facility];  Service: Endoscopy;  Laterality: N/A;   FLEXIBLE SIGMOIDOSCOPY N/A 09/24/2018   Procedure: FLEXIBLE SIGMOIDOSCOPY;  Surgeon: Danie Binder, MD;  [facility];  Service: Endoscopy;  Laterality: N/A;   HERNIA REPAIR     abd hernia repair   IVC filter     ivc filter     port a cath placement     PORT-A-CATH REMOVAL  09/24/2011   Procedure: REMOVAL PORT-A-CATH;  Surgeon: Donato Heinz, MD;  Location: [facility];  Service: General;  Laterality: N/A;  Minor Room   PORTACATH PLACEMENT Right 07/07/2016   Procedure: INSERTION PORT-A-CATH;  Surgeon: Aviva Signs, MD;  Location: [facility];  Service: General;  Laterality: Right;   SPHINCTEROTOMY N/A 09/16/2018   Procedure: SPHINCTEROTOMY with balloon dialation;  Surgeon: Rogene Houston, MD;  [facility];  Service: Endoscopy;  Laterality: N/A;   TRANSVERSE LOOP COLOSTOMY N/A 09/29/2018   Procedure: TRANSVERSE LOOP COLOSTOMY;  Surgeon: Aviva Signs, MD;  Location: [facility];  Service: General;  Laterality: N/A;   VIDEO ASSISTED THORACOSCOPY (VATS)/WEDGE RESECTION Right 11/15/2014   Procedure: VIDEO ASSISTED THORACOSCOPY (VATS)/LUNG RESECTION WITH RIGHT LINGULECTOMY;  Surgeon: [name], MD;  Location: [facility];  Service: Thoracic;  Laterality: Right;   VIDEO BRONCHOSCOPY N/A 11/15/2014   Procedure: VIDEO BRONCHOSCOPY;  Surgeon: [name], MD;  Location: [facility];  Service: Thoracic;  Laterality: N/A;     HPI  from the history and physical done on the day of admission:    Chief Complaint: Diarrhea.  HPI: Charles [name] is a 68 y.o. male with medical history significant of  history of anxiety, depression, type 2 diabetes, GERD, history of high output ileostomy, history of urolithiasis, hypertension, history of colorectal cancer, history of proctectomy, radiation therapy and chemotherapy (last cycle about 6 weeks ago), history of multiple episodes of pneumonia, DVTs, history of pulmonary embolism on Xarelto who is coming to the emergency department due to persistent diarrhea for the past 4 days.  He states that for the past 4 days he has been having of 8-12 episodes of loose stools daily associated with mild RUQ pain.  The patient states for the past few days every time he tries to eat something area usually resolves in a loose stool bowel movement.  He had an episode of emesis earlier today.  He denies melena, hematochezia, flank pain, dysuria or frequency, but states that his urine is darker and he has been urinating less often.  He has not been febrile until he arrived to the telemetry  floor.  He states he has an occipital headache, has not been able to eat or sleep properly as well.  He has positional dizziness and complaints of occasional pleuritic chest pain, which he thought may be due to his IVC filter placement.  However, the patient has fibrosis on the lungs and the Port-A-Cath catheter.  He denies rhinorrhea, sore throat, wheezing, hemoptysis, palpitations, diaphoresis, PND, orthopnea, but state he occasionally gets lower extremity edema.  No polyuria, polydipsia, polyphagia or blurred vision.  ED Course: Initial vital signs temperature 97.9 F, pulse 118, respirations 18, blood pressure 129/85 mmHg and O2 sat 99% on room air.  The patient received a 500 mL normal saline bolus, hydromorphone 0.5 mg IVP and ondansetron 4 mg IVP x1.  His CBC showed a white count 9.0 with 82% neutrophils, 10% lymphocytes and 8% monocytes.  CMP shows a glucose of 203 and magnesium of 1.5 mg/dL.  Potassium of 3.4 and CO2 of 20 mmol/L.  All other CMP electrolytes are normal when calcium is  corrected to albumin.  Renal function was normal.  Total protein was 6.2 and albumin 3.4 g/dL.  AST was 29, ALT 50, alkaline phosphatase 237 units/L and total bilirubin 2.3 mg/dL.  Renal and hepatic functions are similar to the results from 09/10/2018.  Imaging: his two-view chest radiograph did not show any active cardiopulmonary disease.  There was stable right middle lobe scaring.  CT abdomen/pelvis with contrast was significant for marked interval increase in circumferential wall thickening in the rectum, adjacent to the anastomosis.  Recurrent disease is distant concern.  His stomach is distended with fluid and food material.  There was no associated SBO or gastric outlet obstruction.  Gallbladder was distended with apparent wall edema.  There was stable appearance of the posterior pancreatic head lesion and stable upper normal lymph nodes in the celiac axis.  Aortic atherosclerosis was incidentally seen as well.  Please see images and full radiology report for further detail.      Hospital Course:    Brief summary 68 year old white male with anxiety, depression, type 2 diabetes, GERD, history of high output ileostomy, history of urolithiasis, hypertension, history of metastatic colorectal cancer, history of proctectomy, radiation therapy and chemotherapy (last cycle about 6 weeks ago), DVTs, history of pulmonary embolism on Xarelto admitted 09/22/18 department due to persistent diarrhea for the past 4 days and worsening colorectal stricture. S/p palliative transverse loop colostomy on 09/29/18  A/p Diarrhea inimmunocompromised patientwith EPEC infection and Stenotic Anastomosis noted on flex sig 09/24/18---- Recommendations for no antibiotics at this time S/p palliative/diverting colostomy on 09/29/18--- tolerating diet well, having output in his colostomy bag,  patient and his wife had ostomy care teaching and redemonstration after teaching on 10/01/2018 Discussed with general surgeon Dr.  Arnoldo Morale  Active Problems: Dilated gallbladder-incidental finding-- Patient is currently asymptomatic, Antiemetics as needed, Analgesics as needed. No signs of acute cholecystitis noted, no LFT elevation noted  Type 2 diabetes mellitus ([place]) Blood glucose stable Amaryl may be restarted with breakfast  Metastatic colon cancer - Follow-up with oncology as scheduledwith Dr. [name]  Hypokalemia/hypomagnesemia----replaced and resolved  GERD Continue PPI.  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 twice daily.  DVT (deep venous thrombosis)/History of pulmonary embolism-- Patient received heparin bridge preop,  restarted Xarelto anticoagulation on  09/30/2018     Code Status:Full Family Communication:Discussed with wife  Disposition Plan:Home with home health  consultants:  GI  GS  Procedures:  Diverting/palliative colostomy  Discharge Condition: stable  Follow UP  Follow-up Information    Aviva Signs, MD. Schedule an appointment as soon as possible for a visit on 10/07/2018.   Specialty:  General Surgery Why:  Come to office on 10/07/18 at 9:15am Contact information: [id number] [id number] [phone]           Diet and Activity recommendation:  As advised  Discharge Instructions    Discharge Instructions    Call MD for:  difficulty breathing, headache or visual disturbances   Complete by:  As directed    Call MD for:  persistant dizziness or light-headedness   Complete by:  As directed    Call MD for:  persistant nausea and vomiting   Complete by:  As directed    Call MD for:  severe uncontrolled pain   Complete by:  As directed    Call MD for:  temperature >100.4   Complete by:  As directed    Diet - low sodium heart healthy   Complete by:  As directed    Discharge instructions   Complete by:  As directed    1) you are taking Xarelto the blood thinner  so Avoid ibuprofen/Advil/Aleve/Motrin/Goody Powders/Naproxen/BC powders/Meloxicam/Diclofenac/Indomethacin and other Nonsteroidal anti-inflammatory medications as these will make you more likely to bleed and can cause stomach ulcers, can also cause Kidney problems.   2) follow-up with Dr. Arnoldo Morale the general surgeon on 10/07/2018 for reevaluation and repeat CBC/complete blood count  3) call if any concerns about bleeding   Increase activity slowly   Complete by:  As directed         Discharge Medications     Allergies as of 10/01/2018      Reactions   Oxycodone    Blisters, hallucinations   Tramadol    Blisters, hallucinations   Trazodone And Nefazodone Other (See Comments)   hallucinations      Medication List    TAKE these medications   acetaminophen 500 MG tablet Commonly known as:  TYLENOL Take 1,000 mg by mouth every 6 (six) hours as needed for mild pain or moderate pain.   clonazePAM 0.5 MG tablet Commonly known as:  KLONOPIN Take 1 tablet (0.5 mg total) by mouth 2 (two) times daily as needed.   diphenoxylate-atropine 2.5-0.025 MG tablet Commonly known as:  LOMOTIL Take 1 tablet by mouth 4 (four) times daily as needed for diarrhea or loose stools.   gabapentin 300 MG capsule Commonly known as:  NEURONTIN Take 3 capsules (900 mg total) by mouth 2 (two) times daily.   glimepiride 2 MG tablet Commonly known as:  AMARYL Take 2 mg by mouth daily with breakfast.   mirtazapine 7.5 MG tablet Commonly known as:  REMERON Take 1 tablet (7.5 mg total) by mouth at bedtime.   omeprazole 20 MG capsule Commonly known as:  PRILOSEC Take 20 mg by mouth 2 (two) times daily before a meal.   ondansetron 8 MG tablet Commonly known as:  Zofran Take 1 tablet (8 mg total) by mouth 2 (two) times daily as needed for refractory nausea / vomiting. Start on day 3 after chemotherapy.   oxyCODONE-acetaminophen 10-325 MG tablet Commonly known as:  Percocet Take 1 tablet by mouth every 6  (six) hours as needed for pain.   PROBIOTIC DAILY PO Take 1 capsule by mouth daily.   rivaroxaban 20 MG Tabs tablet Commonly known as:  XARELTO Take 1 tablet (20 mg total) by mouth daily with supper.   traZODone 100 MG tablet  Commonly known as:  DESYREL Take 1 tablet (100 mg total) by mouth at bedtime.   vitamin B-12 1000 MCG tablet Commonly known as:  CYANOCOBALAMIN Take 1,000 mcg by mouth daily.       Major procedures and Radiology Reports - PLEASE review detailed and final reports for all details, in brief -   Dg Chest 2 View  Result Date: 09/22/2018 CLINICAL DATA:  Diarrhea central chest pain EXAM: CHEST - 2 VIEW COMPARISON:  03/22/2018, CT chest 07/30/2018 FINDINGS: Right-sided central venous port tip over the SVC. Chronic elevation of right diaphragm with scarring at the right middle lobe. CT demonstrated right upper lobe lung nodules not well seen. Stable cardiomediastinal silhouette. No pneumothorax. IMPRESSION: No active cardiopulmonary disease. Stable right middle lobe scarring. Electronically Signed   By: Donavan Foil M.D.   On: 09/22/2018 16:59   Ct Abdomen Pelvis W Contrast  Result Date: 09/22/2018 CLINICAL DATA:  Stage IV rectal cancer with pancreatic metastases. Status post common bile duct stent placement. EXAM: CT ABDOMEN AND PELVIS WITH CONTRAST TECHNIQUE: Multidetector CT imaging of the abdomen and pelvis was performed using the standard protocol following bolus administration of intravenous contrast. CONTRAST:  116mL OMNIPAQUE IOHEXOL 300 MG/ML  SOLN COMPARISON:  MRI 09/10/2018.  CT scan 07/30/2018. FINDINGS: Lower chest: The heart size is normal. No substantial pericardial effusion. Coronary artery calcification is evident. Stable 7 mm anterior right lung nodule. Atelectasis noted lower lobes bilaterally. Hepatobiliary: No suspicious focal abnormality within the liver parenchyma. Gallbladder is distended with ill-defined gallbladder wall suggesting edema. Pneumobilia  is compatible with the presence of the common bile duct stent. Pancreas: Diffuse pancreatic parenchymal atrophy with associated dilatation of the main pancreatic duct. Imaging features similar to prior with abrupt cut off at the level of the pancreatic head. 2.0 cm hypoenhancing lesion in the posterior pancreatic head is similar to prior MRI when it was measured at 2.2 cm. Spleen: No splenomegaly. No focal mass lesion. Adrenals/Urinary Tract: Stable adrenal glands bilaterally without mass lesion. Kidneys unremarkable. No evidence for hydroureter. The urinary bladder appears normal for the degree of distention. Stomach/Bowel: Stomach is distended with fluid and food material. Duodenum is normally positioned as is the ligament of Treitz. No small bowel wall thickening. No small bowel dilatation. No small bowel wall thickening. No small bowel dilatation. The terminal ileum is normal. Appendicoliths noted in the tip of the appendix without periappendiceal edema or inflammation. Anastomotic staple line noted at the rectosigmoid junction with circumferential wall thickening in the rectum, progressed since 07/30/2018. Vascular/Lymphatic: There is abdominal aortic atherosclerosis without aneurysm. IVC filter identified in situ. Stranding in the hepato duodenal ligament may be related to edema. There is no gastrohepatic or hepatoduodenal ligament lymphadenopathy. No intraperitoneal or retroperitoneal lymphadenopathy. The 8 mm short axis celiac axis lymph node is stable and upper normal for size. Similar upper normal lymph nodes are seen in the portal caval space. No pelvic sidewall lymphadenopathy. Reproductive: The prostate gland and seminal vesicles are unremarkable. Other: No intraperitoneal free fluid. Musculoskeletal: Bilateral groin hernias contain only fat. No worrisome lytic or sclerotic osseous abnormality. IMPRESSION: 1. Marked interval increase in circumferential wall thickening in the rectum, adjacent to the  anastomosis. Recurrent disease a distinct concern. 2. Stomach is distended with fluid and food material. No associated small bowel dilatation. No obstructing lesion to suggest gastric outlet obstruction by CT. 3. Gallbladder is distended with apparent gallbladder wall edema. Cholecystitis a concern by imaging. 4. Stable appearance of the posterior pancreatic head lesion with  diffuse pancreatic parenchymal atrophy and chronic dilatation of the main pancreatic duct. By report, the pancreatic head lesion represents metastatic colorectal cancer. 5. Stable upper normal lymph nodes in the celiac axis. 6.  Aortic Atherosclerois (ICD10-170.0) 7. Stable anterior right upper lobe pulmonary nodule. Electronically Signed   By: Misty Stanley M.D.   On: 09/22/2018 17:30   Mr 3d Recon At Scanner  Result Date: 09/10/2018 CLINICAL DATA:  Right upper quadrant abdominal pain. 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 EXAM: MRI ABDOMEN WITHOUT AND WITH CONTRAST (INCLUDING MRCP) TECHNIQUE: Multiplanar multisequence MR imaging of the abdomen was performed both before and after the administration of intravenous contrast. Heavily T2-weighted images of the biliary and pancreatic ducts were obtained, and three-dimensional MRCP images were rendered by post processing. CONTRAST:  9 mL Gadovist IV COMPARISON:  Right upper quadrant ultrasound dated 09/10/2018 FINDINGS: Lower chest: Lungs are essentially clear in this patient with known pulmonary nodules on prior CT. Hepatobiliary: Liver is within normal limits. No suspicious/enhancing hepatic lesions. Mildly distended gallbladder.  No layering gallstones. Mild intrahepatic ductal dilatation in the left hepatic lobe (series 5006/image 19). Common duct measures 8 mm, mildly prominent. No choledocholithiasis is seen. Pancreas: Chronic dilatation of the main pancreatic duct, measuring up to 11 mm, with associated atrophy of the pancreatic body/tail. Underlying 1.3 x 2.1 cm  obstructing mass in the pancreatic head, previously 1.7 cm on CT. Spleen:  Within normal limits. Adrenals/Urinary Tract:  Adrenal glands are within normal limits. Kidneys are notable for bilateral renal sinus cysts. No hydronephrosis. Stomach/Bowel: Stomach is within normal limits. Visualized bowel is unremarkable. Vascular/Lymphatic:  No evidence of abdominal aortic aneurysm. No suspicious abdominal lymphadenopathy. Other:  No abdominal ascites. Musculoskeletal: No focal osseous lesions. IMPRESSION: 1.3 x 2.1 cm mass in the pancreatic head, mildly increased. Reportedly, this reflects a pancreatic metastasis, although primary pancreatic neoplasm could have a similar appearance. Associated chronic dilatation of the main pancreatic duct with atrophy of the pancreatic body/tail. Secondary mild intrahepatic and extrahepatic ductal dilatation. Common duct measures 8 mm. No choledocholithiasis is seen. Electronically Signed   By: Julian Hy M.D.   On: 09/10/2018 18:07   Dg Chest Port 1 View  Result Date: 09/25/2018 CLINICAL DATA:  Diarrhea.  History of cancer and diabetes. EXAM: PORTABLE CHEST 1 VIEW COMPARISON:  Radiographs 09/22/2018.  CT 07/30/2018. FINDINGS: 1608 hours. Mild patient rotation to the right. Right subclavian Port-A-Cath appears unchanged at the lower SVC level. The heart size and mediastinal contours are stable. There is stable opacity at the right lung base and mild right lung nodularity. The left lung is clear. There is no pleural effusion or pneumothorax. IMPRESSION: Stable chest without acute findings. Electronically Signed   By: Richardean Sale M.D.   On: 09/25/2018 16:29   Dg Ercp  Result Date: 09/16/2018 CLINICAL DATA:  Obstructive jaundice. ERCP with balloon dilatation and biliary stent placement. EXAM: ERCP TECHNIQUE: Multiple spot images obtained with the fluoroscopic device and submitted for interpretation post-procedure. COMPARISON:  MRCP-09/10/2018; CT abdomen  pelvis-07/30/2018 FLUOROSCOPY TIME:  2 minutes, 48 seconds FINDINGS: Six spot fluoroscopic images of the right upper abdominal quadrant during ERCP are provided for review Initial image demonstrates an ERCP probe overlying the right upper abdominal quadrant. Incidentally noted IVC filter. There is selective cannulation and opacification of the common bile duct which appears to demonstrate malignant narrowing/occlusion of its distal aspect. Subsequent images demonstrate balloon dilatation of the distal aspect of the CBD. Completion image demonstrates placement of an internal  biliary stent overlying the expected location of the mid and distal aspect of the CBD. IMPRESSION: ERCP with biliary dilatation and stent placement as detailed above. These images were submitted for radiologic interpretation only. Please see the procedural report for the amount of contrast and the fluoroscopy time utilized. Electronically Signed   By: Sandi Mariscal M.D.   On: 09/16/2018 14:38   Mr Abdomen Mrcp Moise Boring Contast  Result Date: 09/10/2018 CLINICAL DATA:  Right upper quadrant abdominal pain. 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 EXAM: MRI ABDOMEN WITHOUT AND WITH CONTRAST (INCLUDING MRCP) TECHNIQUE: Multiplanar multisequence MR imaging of the abdomen was performed both before and after the administration of intravenous contrast. Heavily T2-weighted images of the biliary and pancreatic ducts were obtained, and three-dimensional MRCP images were rendered by post processing. CONTRAST:  9 mL Gadovist IV COMPARISON:  Right upper quadrant ultrasound dated 09/10/2018 FINDINGS: Lower chest: Lungs are essentially clear in this patient with known pulmonary nodules on prior CT. Hepatobiliary: Liver is within normal limits. No suspicious/enhancing hepatic lesions. Mildly distended gallbladder.  No layering gallstones. Mild intrahepatic ductal dilatation in the left hepatic lobe (series 5006/image 19). Common duct measures 8  mm, mildly prominent. No choledocholithiasis is seen. Pancreas: Chronic dilatation of the main pancreatic duct, measuring up to 11 mm, with associated atrophy of the pancreatic body/tail. Underlying 1.3 x 2.1 cm obstructing mass in the pancreatic head, previously 1.7 cm on CT. Spleen:  Within normal limits. Adrenals/Urinary Tract:  Adrenal glands are within normal limits. Kidneys are notable for bilateral renal sinus cysts. No hydronephrosis. Stomach/Bowel: Stomach is within normal limits. Visualized bowel is unremarkable. Vascular/Lymphatic:  No evidence of abdominal aortic aneurysm. No suspicious abdominal lymphadenopathy. Other:  No abdominal ascites. Musculoskeletal: No focal osseous lesions. IMPRESSION: 1.3 x 2.1 cm mass in the pancreatic head, mildly increased. Reportedly, this reflects a pancreatic metastasis, although primary pancreatic neoplasm could have a similar appearance. Associated chronic dilatation of the main pancreatic duct with atrophy of the pancreatic body/tail. Secondary mild intrahepatic and extrahepatic ductal dilatation. Common duct measures 8 mm. No choledocholithiasis is seen. Electronically Signed   By: Julian Hy M.D.   On: 09/10/2018 18:07   US Abdomen Limited Ruq  Result Date: 09/10/2018 CLINICAL DATA:  Right upper quadrant abdominal pain for 1 year. History rectal cancer. EXAM: ULTRASOUND ABDOMEN LIMITED RIGHT UPPER QUADRANT COMPARISON:  07/30/2018 CT abdomen/pelvis. FINDINGS: Gallbladder: Small amount of layering sludge in mildly distended gallbladder. Mild diffuse gallbladder wall thickening (4 mm gallbladder wall thickness). No cholelithiasis. No sonographic Murphy sign. No pericholecystic fluid. Common bile duct: Diameter: 7 mm Liver: Liver parenchyma is diffusely mildly echogenic. No liver mass detected, noting decreased sensitivity in the setting of an echogenic liver. Portal vein is patent on color Doppler imaging with normal direction of blood flow towards the  liver. IMPRESSION: 1. Mildly distended gallbladder. No cholelithiasis. Layering gallbladder sludge. No pericholecystic fluid. No sonographic Murphy's sign. Findings are nonspecific, most likely chronic. If there is clinical concern for acute acalculous cholecystitis, hepatobiliary scintigraphy study could be obtained for further evaluation. 2. Mildly dilated common bile duct (7 mm diameter), slightly increased from 07/30/2018 CT abdomen study. A cause of biliary obstruction is not identified on this ultrasound study. Recommend correlation with serum bilirubin levels. MRI abdomen with MRCP without and with IV contrast could be obtained for further evaluation as clinically warranted. 3. Nonspecific echogenic liver parenchyma, which could be due to hepatic steatosis and/or fibrosis. No liver masses, noting decreased sensitivity in  the setting of an echogenic liver. Electronically Signed   By: Ilona Sorrel M.D.   On: 09/10/2018 12:41    Micro Results    Recent Results (from the past 240 hour(s))  Gastrointestinal Panel by PCR , Stool     Status: Abnormal   Collection Time: 09/22/18  4:04 PM  Result Value Ref Range Status   Campylobacter species NOT DETECTED NOT DETECTED Final   Plesimonas shigelloides NOT DETECTED NOT DETECTED Final   Salmonella species NOT DETECTED NOT DETECTED Final   Yersinia enterocolitica NOT DETECTED NOT DETECTED Final   Vibrio species NOT DETECTED NOT DETECTED Final   Vibrio cholerae NOT DETECTED NOT DETECTED Final   Enteroaggregative E coli (EAEC) NOT DETECTED NOT DETECTED Final   Enteropathogenic E coli (EPEC) DETECTED (A) NOT DETECTED Final    Comment: CRITICAL RESULT CALLED TO, READ BACK BY AND VERIFIED WITH: CALLED TO DANA COX @1442  09/23/2018 S[facility]    Enterotoxigenic E coli (ETEC) NOT DETECTED NOT DETECTED Final   Shiga like toxin producing E coli (STEC) NOT DETECTED NOT DETECTED Final   Shigella/Enteroinvasive E coli (EIEC) NOT DETECTED NOT DETECTED Final    Cryptosporidium NOT DETECTED NOT DETECTED Final   Cyclospora cayetanensis NOT DETECTED NOT DETECTED Final   Entamoeba histolytica NOT DETECTED NOT DETECTED Final   Giardia lamblia NOT DETECTED NOT DETECTED Final   Adenovirus F40/41 NOT DETECTED NOT DETECTED Final   Astrovirus NOT DETECTED NOT DETECTED Final   Norovirus GI/GII NOT DETECTED NOT DETECTED Final   Rotavirus A NOT DETECTED NOT DETECTED Final   Sapovirus (I, II, IV, and V) NOT DETECTED NOT DETECTED Final    Comment: Performed at [facility], [address] [place], [place] [address]  C difficile quick scan w PCR reflex     Status: None   Collection Time: 09/22/18  4:05 PM  Result Value Ref Range Status   C Diff antigen NEGATIVE NEGATIVE Final   C Diff toxin NEGATIVE NEGATIVE Final   C Diff interpretation No C. difficile detected.  Final    Comment: VALID Performed at [facility], [address], [place], [place] [address]   SARS Coronavirus 2 (CEPHEID - Performed in [place] hospital lab), Hosp Order     Status: None   Collection Time: 09/22/18  7:46 PM  Result Value Ref Range Status   SARS Coronavirus 2 NEGATIVE NEGATIVE Final    Comment: (NOTE) If result is NEGATIVE SARS-CoV-2 target nucleic acids are NOT DETECTED. The SARS-CoV-2 RNA is generally detectable in upper and lower  respiratory specimens during the acute phase of infection. The lowest  concentration of SARS-CoV-2 viral copies this assay can detect is 250  copies / mL. A negative result does not preclude SARS-CoV-2 infection  and should not be used as the sole basis for treatment or other  patient management decisions.  A negative result may occur with  improper specimen collection / handling, submission of specimen other  than nasopharyngeal swab, presence of viral mutation(s) within the  areas targeted by this assay, and inadequate number of viral copies  (<250 copies / mL). A negative result must be combined with clinical  observations,  patient history, and epidemiological information. If result is POSITIVE SARS-CoV-2 target nucleic acids are DETECTED. The SARS-CoV-2 RNA is generally detectable in upper and lower  respiratory specimens dur ing the acute phase of infection.  Positive  results are indicative of active infection with SARS-CoV-2.  Clinical  correlation with patient history and other diagnostic  information is  necessary to determine patient infection status.  Positive results do  not rule out bacterial infection or co-infection with other viruses. If result is PRESUMPTIVE POSTIVE SARS-CoV-2 nucleic acids MAY BE PRESENT.   A presumptive positive result was obtained on the submitted specimen  and confirmed on repeat testing.  While 2019 novel coronavirus  (SARS-CoV-2) nucleic acids may be present in the submitted sample  additional confirmatory testing may be necessary for epidemiological  and / or clinical management purposes  to differentiate between  SARS-CoV-2 and other Sarbecovirus currently known to infect humans.  If clinically indicated additional testing with an alternate test  methodology [phone]) is advised. The SARS-CoV-2 RNA is generally  detectable in upper and lower respiratory sp ecimens during the acute  phase of infection. The expected result is Negative. Fact Sheet for Patients:  StrictlyIdeas.no Fact Sheet for Healthcare Providers: BankingDealers.co.za This test is not yet approved or cleared by the [place] FDA and has been authorized for detection and/or diagnosis of SARS-CoV-2 by FDA under an Emergency Use Authorization (EUA).  This EUA will remain in effect (meaning this test can be used) for the duration of the COVID-19 declaration under Section 564(b)(1) of the Act, 21 U.S.C. section 360bbb-3(b)(1), unless the authorization is terminated or revoked sooner. Performed at [facility], [address] [place], [place] [address]     Culture, blood (routine x 2)     Status: None   Collection Time: 09/22/18 11:32 PM  Result Value Ref Range Status   Specimen Description BLOOD RIGHT ANTECUBITAL  Final   Special Requests   Final    BOTTLES DRAWN AEROBIC AND ANAEROBIC Blood Culture adequate volume   Culture   Final    NO GROWTH 5 DAYS Performed at [facility], [address] [place], [place] [address]    Report Status 09/27/2018 FINAL  Final  Culture, blood (routine x 2)     Status: None   Collection Time: 09/22/18 11:39 PM  Result Value Ref Range Status   Specimen Description BLOOD RIGHT HAND  Final   Special Requests   Final    BOTTLES DRAWN AEROBIC AND ANAEROBIC Blood Culture adequate volume   Culture   Final    NO GROWTH 5 DAYS Performed at [facility], [address], [place], [place] [address]    Report Status 09/27/2018 FINAL  Final  Culture, blood (routine x 2)     Status: None   Collection Time: 09/25/18  4:16 PM  Result Value Ref Range Status   Specimen Description RIGHT ANTECUBITAL  Final   Special Requests   Final    BOTTLES DRAWN AEROBIC AND ANAEROBIC Blood Culture adequate volume   Culture   Final    NO GROWTH 5 DAYS Performed at [facility], [address], [place], [place] [address]    Report Status 09/30/2018 FINAL  Final  Culture, blood (routine x 2)     Status: None   Collection Time: 09/25/18  4:16 PM  Result Value Ref Range Status   Specimen Description BLOOD RIGHT ARM  Final   Special Requests   Final    BOTTLES DRAWN AEROBIC AND ANAEROBIC Blood Culture adequate volume   Culture   Final    NO GROWTH 5 DAYS Performed at [facility], [address] [place], [place] [address]    Report Status 09/30/2018 FINAL  Final  Surgical pcr screen     Status: Abnormal   Collection Time: 09/28/18  7:20 PM  Result  Value Ref Range Status   MRSA, PCR NEGATIVE NEGATIVE Final   Staphylococcus aureus POSITIVE (A) NEGATIVE Final    Comment: RESULT CALLED TO, READ BACK BY AND VERIFIED  WITH: THOMAS,C ON 09/28/18 AT 2145 BY LOY,C (NOTE) The Xpert SA Assay (FDA approved for NASAL specimens in patients 69 years of age and older), is one component of a comprehensive surveillance program. It is not intended to diagnose infection nor to guide or monitor treatment. Performed at [facility], [address], [place], [place] [address]        Today   Subjective    Charles [name] today has no new complaints... Eating and drinking well, wife at bedside, ostomy with output         Patient has been seen and examined prior to discharge   Objective   Blood pressure (!) 148/79, pulse 77, temperature 98.3 F (36.8 C), temperature source Oral, resp. rate 16, height 6\' 2"  (1.88 m), weight 87.5 kg, SpO2 95 %.   Intake/Output Summary (Last 24 hours) at 10/01/2018 1049 Last data filed at 10/01/2018 0900 Gross per 24 hour  Intake 1479.91 ml  Output 1900 ml  Net -420.09 ml    Exam Gen:- Awake Alert,  In no apparent distress  HEENT:- [place].AT, No sclera icterus Neck-Supple Neck,No JVD,.  Lungs-  CTAB , fair symmetrical air movement CV- S1, S2 normal, regular,right subclavian area Port-A-Cath site is clean dry and intact Abd-  +ve B.Sounds, Abd Soft, appropriate postop tenderness,   ostomy bag with fecal contents  Extremity/Skin:- No  edema, pedal pulses present  Psych-affect is appropriate, oriented x3 Neuro-no new focal deficits, no tremors   Data Review   CBC w Diff:  Lab Results  Component Value Date   WBC 5.7 10/01/2018   HGB 11.2 (L) 10/01/2018   HGB 11.6 (L) 02/04/2011   HCT 34.3 (L) 10/01/2018   HCT 34.7 (L) 02/04/2011   PLT 238 10/01/2018   PLT 282 02/04/2011   LYMPHOPCT 10 09/22/2018   LYMPHOPCT 16.3 02/04/2011   MONOPCT 8 09/22/2018   MONOPCT 7.0 02/04/2011   EOSPCT 0 09/22/2018   EOSPCT 2.3 02/04/2011   BASOPCT 0 09/22/2018   BASOPCT 1.0 02/04/2011    CMP:  Lab Results  Component Value Date   NA 137 09/30/2018   NA 136 10/30/2010   K 4.0  09/30/2018   K 4.0 10/30/2010   CL 103 09/30/2018   CL 101 10/30/2010   CO2 25 09/30/2018   CO2 27 10/30/2010   BUN 7 (L) 09/30/2018   BUN 9 10/30/2010   CREATININE 0.65 09/30/2018   CREATININE 0.83 02/10/2011   PROT 5.5 (L) 09/23/2018   PROT 6.8 10/30/2010   ALBUMIN 2.8 (L) 09/23/2018   ALBUMIN 3.3 10/30/2010   BILITOT 2.7 (H) 09/23/2018   BILITOT 1.00 10/30/2010   ALKPHOS 191 (H) 09/23/2018   ALKPHOS 89 (H) 10/30/2010   AST 22 09/23/2018   AST 17 10/30/2010   ALT 38 09/23/2018   ALT 13 10/30/2010  .   Total Discharge time is about 33 minutes  Roxan Hockey M.D on 10/01/2018 at 10:49 AM  Go to [email or web address] -  for contact info  [facility] - Office  [phone]

## 2018-10-01 NOTE — Discharge Instructions (Signed)
1) you are taking Xarelto the blood thinner so Avoid ibuprofen/Advil/Aleve/Motrin/Goody Powders/Naproxen/BC powders/Meloxicam/Diclofenac/Indomethacin and other Nonsteroidal anti-inflammatory medications as these will make you more likely to bleed and can cause stomach ulcers, can also cause Kidney problems.   2) follow-up with Dr. Arnoldo Morale the general surgeon on 10/07/2018 for reevaluation and repeat CBC/complete blood count  3) call if any concerns about bleeding

## 2018-10-02 DIAGNOSIS — G620 Drug-induced polyneuropathy: Secondary | ICD-10-CM | POA: Diagnosis not present

## 2018-10-02 DIAGNOSIS — T451X5S Adverse effect of antineoplastic and immunosuppressive drugs, sequela: Secondary | ICD-10-CM | POA: Diagnosis not present

## 2018-10-02 DIAGNOSIS — Z7984 Long term (current) use of oral hypoglycemic drugs: Secondary | ICD-10-CM | POA: Diagnosis not present

## 2018-10-02 DIAGNOSIS — Z902 Acquired absence of lung [part of]: Secondary | ICD-10-CM | POA: Diagnosis not present

## 2018-10-02 DIAGNOSIS — Z85048 Personal history of other malignant neoplasm of rectum, rectosigmoid junction, and anus: Secondary | ICD-10-CM | POA: Diagnosis not present

## 2018-10-02 DIAGNOSIS — Z433 Encounter for attention to colostomy: Secondary | ICD-10-CM | POA: Diagnosis not present

## 2018-10-02 DIAGNOSIS — Z86711 Personal history of pulmonary embolism: Secondary | ICD-10-CM | POA: Diagnosis not present

## 2018-10-02 DIAGNOSIS — Z7901 Long term (current) use of anticoagulants: Secondary | ICD-10-CM | POA: Diagnosis not present

## 2018-10-02 DIAGNOSIS — Z95828 Presence of other vascular implants and grafts: Secondary | ICD-10-CM | POA: Diagnosis not present

## 2018-10-02 DIAGNOSIS — Z86718 Personal history of other venous thrombosis and embolism: Secondary | ICD-10-CM | POA: Diagnosis not present

## 2018-10-02 DIAGNOSIS — C250 Malignant neoplasm of head of pancreas: Secondary | ICD-10-CM | POA: Diagnosis not present

## 2018-10-02 DIAGNOSIS — K9189 Other postprocedural complications and disorders of digestive system: Secondary | ICD-10-CM | POA: Diagnosis not present

## 2018-10-02 DIAGNOSIS — Z85118 Personal history of other malignant neoplasm of bronchus and lung: Secondary | ICD-10-CM | POA: Diagnosis not present

## 2018-10-02 DIAGNOSIS — E1151 Type 2 diabetes mellitus with diabetic peripheral angiopathy without gangrene: Secondary | ICD-10-CM | POA: Diagnosis not present

## 2018-10-04 ENCOUNTER — Inpatient Hospital Stay (HOSPITAL_BASED_OUTPATIENT_CLINIC_OR_DEPARTMENT_OTHER): Payer: Medicare Other | Admitting: Hematology

## 2018-10-04 ENCOUNTER — Other Ambulatory Visit: Payer: Self-pay

## 2018-10-04 ENCOUNTER — Inpatient Hospital Stay (HOSPITAL_COMMUNITY): Payer: Medicare Other | Attending: Hematology

## 2018-10-04 ENCOUNTER — Inpatient Hospital Stay (HOSPITAL_COMMUNITY): Payer: Medicare Other

## 2018-10-04 ENCOUNTER — Encounter (HOSPITAL_COMMUNITY): Payer: Self-pay | Admitting: Hematology

## 2018-10-04 ENCOUNTER — Other Ambulatory Visit (HOSPITAL_COMMUNITY): Payer: Self-pay | Admitting: *Deleted

## 2018-10-04 VITALS — BP 124/64 | HR 72 | Temp 97.9°F | Resp 18 | Wt 186.9 lb

## 2018-10-04 DIAGNOSIS — G479 Sleep disorder, unspecified: Secondary | ICD-10-CM | POA: Diagnosis not present

## 2018-10-04 DIAGNOSIS — C20 Malignant neoplasm of rectum: Secondary | ICD-10-CM | POA: Insufficient documentation

## 2018-10-04 DIAGNOSIS — C7889 Secondary malignant neoplasm of other digestive organs: Secondary | ICD-10-CM

## 2018-10-04 DIAGNOSIS — Z7984 Long term (current) use of oral hypoglycemic drugs: Secondary | ICD-10-CM

## 2018-10-04 DIAGNOSIS — Z7901 Long term (current) use of anticoagulants: Secondary | ICD-10-CM | POA: Diagnosis not present

## 2018-10-04 DIAGNOSIS — Z5111 Encounter for antineoplastic chemotherapy: Secondary | ICD-10-CM | POA: Insufficient documentation

## 2018-10-04 DIAGNOSIS — C7802 Secondary malignant neoplasm of left lung: Secondary | ICD-10-CM | POA: Insufficient documentation

## 2018-10-04 DIAGNOSIS — Z933 Colostomy status: Secondary | ICD-10-CM | POA: Insufficient documentation

## 2018-10-04 DIAGNOSIS — G629 Polyneuropathy, unspecified: Secondary | ICD-10-CM | POA: Insufficient documentation

## 2018-10-04 DIAGNOSIS — Z79899 Other long term (current) drug therapy: Secondary | ICD-10-CM | POA: Insufficient documentation

## 2018-10-04 DIAGNOSIS — G620 Drug-induced polyneuropathy: Secondary | ICD-10-CM | POA: Diagnosis not present

## 2018-10-04 DIAGNOSIS — Z86718 Personal history of other venous thrombosis and embolism: Secondary | ICD-10-CM

## 2018-10-04 DIAGNOSIS — G893 Neoplasm related pain (acute) (chronic): Secondary | ICD-10-CM | POA: Insufficient documentation

## 2018-10-04 DIAGNOSIS — Z885 Allergy status to narcotic agent status: Secondary | ICD-10-CM | POA: Insufficient documentation

## 2018-10-04 DIAGNOSIS — C7801 Secondary malignant neoplasm of right lung: Secondary | ICD-10-CM | POA: Insufficient documentation

## 2018-10-04 DIAGNOSIS — T451X5S Adverse effect of antineoplastic and immunosuppressive drugs, sequela: Secondary | ICD-10-CM | POA: Diagnosis not present

## 2018-10-04 DIAGNOSIS — Z87442 Personal history of urinary calculi: Secondary | ICD-10-CM | POA: Diagnosis not present

## 2018-10-04 DIAGNOSIS — I10 Essential (primary) hypertension: Secondary | ICD-10-CM | POA: Insufficient documentation

## 2018-10-04 DIAGNOSIS — E119 Type 2 diabetes mellitus without complications: Secondary | ICD-10-CM | POA: Diagnosis not present

## 2018-10-04 DIAGNOSIS — K9189 Other postprocedural complications and disorders of digestive system: Secondary | ICD-10-CM | POA: Diagnosis not present

## 2018-10-04 DIAGNOSIS — Z433 Encounter for attention to colostomy: Secondary | ICD-10-CM | POA: Diagnosis not present

## 2018-10-04 DIAGNOSIS — E860 Dehydration: Secondary | ICD-10-CM | POA: Insufficient documentation

## 2018-10-04 DIAGNOSIS — Z86711 Personal history of pulmonary embolism: Secondary | ICD-10-CM | POA: Insufficient documentation

## 2018-10-04 DIAGNOSIS — E1151 Type 2 diabetes mellitus with diabetic peripheral angiopathy without gangrene: Secondary | ICD-10-CM | POA: Diagnosis not present

## 2018-10-04 DIAGNOSIS — C250 Malignant neoplasm of head of pancreas: Secondary | ICD-10-CM | POA: Diagnosis not present

## 2018-10-04 LAB — CBC WITH DIFFERENTIAL/PLATELET
Abs Immature Granulocytes: 0.02 10*3/uL (ref 0.00–0.07)
Basophils Absolute: 0 10*3/uL (ref 0.0–0.1)
Basophils Relative: 0 %
Eosinophils Absolute: 0.4 10*3/uL (ref 0.0–0.5)
Eosinophils Relative: 4 %
HCT: 36.6 % — ABNORMAL LOW (ref 39.0–52.0)
Hemoglobin: 12 g/dL — ABNORMAL LOW (ref 13.0–17.0)
Immature Granulocytes: 0 %
Lymphocytes Relative: 16 %
Lymphs Abs: 1.4 10*3/uL (ref 0.7–4.0)
MCH: 32 pg (ref 26.0–34.0)
MCHC: 32.8 g/dL (ref 30.0–36.0)
MCV: 97.6 fL (ref 80.0–100.0)
Monocytes Absolute: 0.5 10*3/uL (ref 0.1–1.0)
Monocytes Relative: 6 %
Neutro Abs: 6 10*3/uL (ref 1.7–7.7)
Neutrophils Relative %: 74 %
Platelets: 285 10*3/uL (ref 150–400)
RBC: 3.75 MIL/uL — ABNORMAL LOW (ref 4.22–5.81)
RDW: 13.6 % (ref 11.5–15.5)
WBC: 8.3 10*3/uL (ref 4.0–10.5)
nRBC: 0 % (ref 0.0–0.2)

## 2018-10-04 LAB — COMPREHENSIVE METABOLIC PANEL
ALT: 22 U/L (ref 0–44)
AST: 18 U/L (ref 15–41)
Albumin: 2.5 g/dL — ABNORMAL LOW (ref 3.5–5.0)
Alkaline Phosphatase: 102 U/L (ref 38–126)
Anion gap: 13 (ref 5–15)
BUN: 8 mg/dL (ref 8–23)
CO2: 24 mmol/L (ref 22–32)
Calcium: 8.3 mg/dL — ABNORMAL LOW (ref 8.9–10.3)
Chloride: 102 mmol/L (ref 98–111)
Creatinine, Ser: 0.68 mg/dL (ref 0.61–1.24)
GFR calc Af Amer: >60 mL/min (ref >60)
GFR calc non Af Amer: >60 mL/min (ref >60)
Glucose, Bld: 248 mg/dL — ABNORMAL HIGH (ref 70–99)
Potassium: 3.3 mmol/L — ABNORMAL LOW (ref 3.5–5.1)
Sodium: 139 mmol/L (ref 135–145)
Total Bilirubin: 0.6 mg/dL (ref 0.3–1.2)
Total Protein: 5.5 g/dL — ABNORMAL LOW (ref 6.5–8.1)

## 2018-10-04 MED ORDER — FLUOROURACIL CHEMO INJECTION 2.5 GM/50ML
400.0000 mg/m2 | Freq: Once | INTRAVENOUS | Status: AC
  Administered 2018-10-04: 850 mg via INTRAVENOUS
  Filled 2018-10-04: qty 17

## 2018-10-04 MED ORDER — ATROPINE SULFATE 1 MG/ML IJ SOLN
0.5000 mg | Freq: Once | INTRAMUSCULAR | Status: AC | PRN
  Administered 2018-10-04: 0.5 mg via INTRAVENOUS
  Filled 2018-10-04: qty 1

## 2018-10-04 MED ORDER — SODIUM CHLORIDE 0.9 % IV SOLN
Freq: Once | INTRAVENOUS | Status: AC
  Administered 2018-10-04: 09:00:00 via INTRAVENOUS

## 2018-10-04 MED ORDER — SODIUM CHLORIDE 0.9% FLUSH
10.0000 mL | INTRAVENOUS | Status: DC | PRN
  Administered 2018-10-04: 10 mL
  Filled 2018-10-04: qty 10

## 2018-10-04 MED ORDER — SODIUM CHLORIDE 0.9 % IV SOLN
10.0000 mg | Freq: Once | INTRAVENOUS | Status: AC
  Administered 2018-10-04: 10 mg via INTRAVENOUS
  Filled 2018-10-04: qty 10

## 2018-10-04 MED ORDER — SODIUM CHLORIDE 0.9 % IV SOLN
421.0000 mg/m2 | Freq: Once | INTRAVENOUS | Status: AC
  Administered 2018-10-04: 900 mg via INTRAVENOUS
  Filled 2018-10-04: qty 45

## 2018-10-04 MED ORDER — PALONOSETRON HCL INJECTION 0.25 MG/5ML
0.2500 mg | Freq: Once | INTRAVENOUS | Status: AC
  Administered 2018-10-04: 0.25 mg via INTRAVENOUS
  Filled 2018-10-04: qty 5

## 2018-10-04 MED ORDER — SODIUM CHLORIDE 0.9 % IV SOLN
2325.0000 mg/m2 | INTRAVENOUS | Status: DC
  Administered 2018-10-04: 5000 mg via INTRAVENOUS
  Filled 2018-10-04: qty 100

## 2018-10-04 MED ORDER — SODIUM CHLORIDE 0.9 % IV SOLN
180.0000 mg/m2 | Freq: Once | INTRAVENOUS | Status: AC
  Administered 2018-10-04: 380 mg via INTRAVENOUS
  Filled 2018-10-04: qty 15

## 2018-10-04 MED ORDER — DRONABINOL 2.5 MG PO CAPS: 2.5000 mg | ORAL_CAPSULE | Freq: Two times a day (BID) | ORAL | 0 refills | Status: DC

## 2018-10-04 NOTE — Progress Notes (Signed)
[address], [place] [address]   CLINIC:  Medical Oncology/Hematology  PCP:  [name], MD [place] [address] [phone]   REASON FOR VISIT:  Follow-up for rectal cancer   BRIEF ONCOLOGIC HISTORY:    Rectal cancer ([place])   07/24/2010 Initial Diagnosis    Invasive adenocarcinoma of rectum    09/09/2010 Concurrent Chemotherapy    S/P radiation and concurrent 5-FU continuous infusion from 09/09/10- 10/10/10.    11/14/2010 Surgery    S/P proctectomy with colorectal anastomosis and diverting loop ileostomy on 11/14/10 at [facility] by Dr. Harlon Ditty. Pathology reveals a pT3b N1 with 3/20 lymph nodes.    02/05/2011 - 07/14/2011 Chemotherapy    FOLFOX    08/18/2011 Surgery    Approximate date of surgery- [place] by Dr. Harlon Ditty     Remission       11/24/2013 Survivorship    Colonoscopy- somewhat fibrotic/friable anastomotic mucosal-status post biopsy (narrowing not felt to be clinically significant).  Negative pathology for malignancy    09/12/2014 Imaging    DVT in the left femoral venous system, left common iliac vein, IVC, and within the IVC filter Right lower extremity venography confirms chronic occlusion of the femoral venous system with collateralization. The right iliac venous system is patent and do    09/25/2014 PET scan    The right middle lobe pulmonary nodule is hypermetabolic, favored to represent a primary bronchogenic carcinoma.Equivocal mediastinal nodes, similar to surrounding blood pool. Bilateral adrenal hypermetabolism, felt to be physiologic    10/24/2014 Pathology Results    Lung, needle/core biopsy(ies), RML - ADENOCARCINOMA, SEE COMMENT metastatic adenocarcinoma of a colorectal primary    10/24/2014 Relapse/Recurrence       11/16/2014 Definitive Surgery    Bronchoscopy, right video-assisted thoracoscopy, wedge resection of right middle lobe by Dr. Servando Snare    11/16/2014 Pathology Results    Lung, wedge  biopsy/resection, right lingula and small portion of middle lobe - METASTATIC ADENOCARCINOMA, CONSISTENT WITH COLORECTAL PRIMARY, SPANNING 2.0 CM. - THE SURGICAL RESECTION MARGINS ARE NEGATIVE FOR ADENOCARCINOMA.    11/16/2014 Remission    THE SURGICAL RESECTION MARGINS ARE NEGATIVE FOR ADENOCARCINOMA.    03/07/2015 Imaging    CT CAP- Interval resection of right middle lobe metastasis. No acute process or evidence of metastatic disease in the chest, abdomen or pelvis. Improved right upper lobe reticular nodular opacities are favored to represent resolving infection.    09/14/2015 Imaging    CT CAP- No findings of recurrent malignancy. No recurrence along the wedge resection site of the right middle lobe. Right anterior abdominal wall focal hernia containing a knuckle of small bowel without complicating feature.    06/13/2016 Imaging    CT CAP- 1. New right-sided pleural metastasis/mass. Other smaller right-sided pulmonary nodules which are all pleural-based and most likely represent pleural metastasis. 2. No convincing evidence of abdominopelvic nodal metastasis. 3. Constellation of findings, including pancreatic atrophy, duct dilatation, and pancreatic head soft tissue fullness which are highly suspicious for pancreatic adenocarcinoma. Metastatic disease felt much less likely. Consider endoscopic ultrasound sampling or ERCP. Cannot exclude superimposed acute pancreatitis. 4. New enlargement of the appendiceal tip with subtle surrounding edema. Cannot exclude early or mild appendicitis. 5. Coronary artery atherosclerosis. Aortic atherosclerosis. 6. 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    06/13/2016 Progression    CT scan demonstrates progression of disease    06/19/2016 Procedure    EUS with  FNA by Dr. Ardis Hughs    06/20/2016 Pathology Results    FINE NEEDLE ASPIRATION, ENDOSCOPIC, PANCREAS UNCINATE AREA(SPECIMEN 1 OF 1 COLLECTED 06/19/16): MALIGNANT CELLS  CONSISTENT WITH METASTATIC ADENOCARCINOMA.    06/25/2016 PET scan    1. Two pleural-based nodules in the right hemithorax are hypermetabolic. Metastatic disease is a distinct consideration. The scattered pulmonary parenchymal nodule seen on previous diagnostic CT imaging are below the threshold for reliable resolution on PET imaging. 2. Hypermetabolic lesion pancreatic head, consistent with neoplasm. As noted on prior CT, adenocarcinoma is any consideration. No evidence for hypermetabolic abdominal lymphadenopathy. Mottled uptake noted in the liver, but no discrete hepatic metastases are evident on PET imaging. 3. Appendix remains distended up to 0.9-10 mm diameter with a stone towards the tip in subtle periappendiceal edema/inflammation. The appendix is hypermetabolic along its length. Imaging features are relatively stable in the 12 day interval since prior CT scan. While appendicitis is a consideration, the relative stability over 12 days would be unusual for that etiology. Continued close follow-up recommended.    06/26/2016 Pathology Results    Not enough tissue for foundationONE or K-ras testing.    07/07/2016 Procedure    Port placed by Dr. Arnoldo Morale    07/08/2016 -  Chemotherapy    The patient had pegfilgrastim (NEULASTA) injection 6 mg, 6 mg, Subcutaneous,  Once, 0 of 4 cycles  ondansetron (ZOFRAN) IVPB 8 mg, 8 mg, Intravenous,  Once, 0 of 4 cycles  leucovorin 804 mg in dextrose 5 % 250 mL infusion, 400 mg/m2, Intravenous,  Once, 0 of 4 cycles  oxaliplatin (ELOXATIN) 170 mg in dextrose 5 % 500 mL chemo infusion, 85 mg/m2, Intravenous,  Once, 0 of 4 cycles  fluorouracil (ADRUCIL) 4,800 mg in sodium chloride 0.9 % 150 mL chemo infusion, 2,400 mg/m2 = 4,800 mg, Intravenous, 1 Day/Dose, 0 of 4 cycles  fluorouracil (ADRUCIL) chemo injection 800 mg, 400 mg/m2, Intravenous,  Once, 0 of 4 cycles  pegfilgrastim (NEULASTA) injection 6 mg, 6 mg, Subcutaneous,  Once, 2 of 2 cycles   ondansetron (ZOFRAN) IVPB 8 mg, 8 mg, Intravenous,  Once, 12 of 12 cycles  leucovorin 660 mg in dextrose 5 % 250 mL infusion, 660 mg (original dose ), Intravenous,  Once, 12 of 12 cycles Dose modification: 660 mg (Cycle 1)  oxaliplatin (ELOXATIN) 140 mg in dextrose 5 % 500 mL chemo infusion, 140 mg (original dose ), Intravenous,  Once, 12 of 12 cycles Dose modification: 140 mg (Cycle 1), 119 mg (85 % of original dose 140 mg, Cycle 8, Reason: Provider Judgment)  fluorouracil (ADRUCIL) 3,850 mg in sodium chloride 0.9 % 150 mL chemo infusion, 3,850 mg (original dose ), Intravenous, 1 Day/Dose, 12 of 12 cycles Dose modification: 3,850 mg (Cycle 1)  fluorouracil (ADRUCIL) chemo injection 700 mg, 700 mg (original dose ), Intravenous,  Once, 12 of 12 cycles Dose modification: 700 mg (Cycle 1)  palonosetron (ALOXI) injection 0.25 mg, 0.25 mg, Intravenous,  Once, 7 of 12 cycles Administration: 0.25 mg (09/30/2016)  bevacizumab (AVASTIN) 475 mg in sodium chloride 0.9 % 100 mL chemo infusion, 5 mg/kg = 475 mg, Intravenous,  Once, 6 of 11 cycles Administration: 500 mg (09/30/2016)  leucovorin 880 mg in dextrose 5 % 250 mL infusion, 400 mg/m2 = 880 mg, Intravenous,  Once, 7 of 12 cycles Administration: 880 mg (09/30/2016)  oxaliplatin (ELOXATIN) 185 mg in dextrose 5 % 500 mL chemo infusion, 85 mg/m2 = 185 mg, Intravenous,  Once, 7 of 12 cycles Administration: 185 mg (  09/30/2016)  fluorouracil (ADRUCIL) chemo injection 900 mg, 400 mg/m2 = 900 mg, Intravenous,  Once, 7 of 12 cycles Administration: 900 mg (09/30/2016)  fluorouracil (ADRUCIL) 5,300 mg in sodium chloride 0.9 % 144 mL chemo infusion, 2,400 mg/m2 = 5,300 mg, Intravenous, 1 Day/Dose, 7 of 12 cycles Administration: 5,300 mg (09/30/2016)  for chemotherapy treatment.      08/27/2016 Imaging    CT CAP- 1. Pulmonary metastatic disease is stable. 2. Pancreatic head mass, grossly stable, with progressive atrophy of the body and tail of the  pancreas. Stable peripancreatic lymph node. 3. Mild circumferential rectal wall thickening, stable. 4. Aortic atherosclerosis (ICD10-170.0). Coronary artery calcification. 5. Hyperattenuating lesion off the lower pole left kidney, stable, too small to characterize. Continued attention on followup exams is warranted. 6. Persistent wall thickening and mild dilatation of the proximal appendix, of uncertain etiology. Continued attention on followup exams is warranted.    11/06/2016 Imaging    CT C/A/P: IMPRESSION: 1. Stable pulmonary metastatic disease. 2. Similar appearing pancreatic head mass. Progressed atrophy of the pancreatic body and tail. Stable peripancreatic lymph node. 3. Stable hypoattenuating lesion partially exophytic off the inferior pole of the left kidney. 4. Aortic atherosclerosis. 5. Persistent mild wall thickening and mild dilatation of the appendix.     01/30/2017 Imaging    Ct C/A/P: IMPRESSION: 1. Stable pulmonary metastatic disease. 2. Stable to slight increase in size of pancreatic head mass. 3.  Aortic Atherosclerosis (ICD10-I70.0). 4. Similar appearance of mild wall thickening of the appendix which contains an appendicolith.    05/01/2017 Imaging    CT C/A/P: Stable disease in the lungs, no progression of disease in the abdomen.    10/04/2018 -  Chemotherapy    The patient had palonosetron (ALOXI) injection 0.25 mg, 0.25 mg, Intravenous,  Once, 1 of 4 cycles bevacizumab (AVASTIN) 450 mg in sodium chloride 0.9 % 100 mL chemo infusion, 5 mg/kg = 450 mg, Intravenous,  Once, 1 of 4 cycles irinotecan (CAMPTOSAR) 380 mg in dextrose 5 % 500 mL chemo infusion, 180 mg/m2 = 380 mg, Intravenous,  Once, 1 of 4 cycles leucovorin 856 mg in dextrose 5 % 250 mL infusion, 400 mg/m2 = 856 mg, Intravenous,  Once, 1 of 4 cycles fluorouracil (ADRUCIL) chemo injection 850 mg, 400 mg/m2 = 850 mg, Intravenous,  Once, 1 of 4 cycles fluorouracil (ADRUCIL) 5,150 mg in sodium  chloride 0.9 % 147 mL chemo infusion, 2,400 mg/m2 = 5,150 mg, Intravenous, 1 Day/Dose, 1 of 4 cycles  for chemotherapy treatment.       CANCER STAGING: Cancer Staging Rectal cancer [facility]) Staging form: Colon and Rectum, AJCC 7th Edition - Clinical: Stage IIIB (T3, N1, M0) - Signed by [name], MD on 02/04/2011 - Pathologic stage from 11/17/2014: Stage IVA (M1a) - Signed by Baird Cancer, PA-C on 09/14/2015    INTERVAL HISTORY:  Mr. Babe 68 y.o. male is here for follow-up and initiate his first cycle of chemotherapy.  He was in the hospital last week and had a transverse loop colostomy placed on 09/29/2018.  Is tolerating the colostomy very well.  Energy levels are fair.  He denies any nausea, vomiting or diarrhea.  His appetite levels have decreased.  He started back on Xarelto.  Denies any bleeding episodes.  Numbness in the feet has been stable.  Appetite is 75%.  Energy levels are 50%.  Pain is reported as 0.  Denies any fevers or infections.  No new onset cough reported.   REVIEW OF  SYSTEMS:  Review of Systems  Cardiovascular: Negative for chest pain.  Neurological: Positive for numbness.  All other systems reviewed and are negative.    PAST MEDICAL/SURGICAL HISTORY:  Past Medical History:  Diagnosis Date  . Chronic anticoagulation   . Colon cancer ([place]) 07/24/2010   rectal ca, inv adenocarcinoma  . Depression 04/01/2011  . Diabetes mellitus without complication ([place])   . DVT (deep venous thrombosis) ([place]) 05/09/2011  . GERD (gastroesophageal reflux disease)   . High output ileostomy ([place]) 05/21/2011  . History of kidney stones   . HTN (hypertension)   . Hx of radiation therapy 09/02/10 to 10/14/10   pelvis  . Lung metastasis ([place])   . Neuropathy   . Peripheral vascular disease ([place])    dvt's,pe  . Pneumonia    hx x3  . Pulmonary embolism ([place])   . Rectal cancer ([place]) 01/15/2011   S/P radiation and concurrent 5-FU continuous infusion from 09/09/10- 10/10/10.   S/P proctectomy with colorectal anastomosis and diverting loop ileostomy on 11/14/10 at [facility] by Dr. Harlon Ditty. Pathology reveals a pT3b N1 with 3/20 lymph nodes.     Past Surgical History:  Procedure Laterality Date  . BILIARY STENT PLACEMENT N/A 09/16/2018   Procedure: STENT PLACEMENT;  Surgeon: Rogene Houston, MD;  [facility];  Service: Endoscopy;  Laterality: N/A;  . BIOPSY  09/24/2018   Procedure: BIOPSY;  Surgeon: Danie Binder, MD;  [facility];  Service: Endoscopy;;  . COLON SURGERY  11/14/2010   proctectomy with colorectal anastomosis and diverting loop ileostomy (temporary planned)  . COLONOSCOPY  07/2010   proximal rectal apple core mass 10-14cm from anal verge (adenocarcinoma), 2-3cm distal rectal carpet polyp s/p piecemeal snare polypectomy (adenoma)  . COLONOSCOPY  04/21/2012   RMR: Friable,fibrotic appearing colorectal anastomosis producing some luminal narrowing-not felt to be critical. path: focal erosion with slight inflammation and hyperemia. SURVEILLANCE DUE DEC 2015  . COLONOSCOPY N/A 11/24/2013   Dr. Rourk:somewhat fibrotic/friable anastomotic mucosal-status post biopsy (narrowing not felt to be clinically significant) Single colonic diverticulum. benign polypoid rectal mucosa  . colostomy reversal  april 2013  . ERCP N/A 09/16/2018   Procedure: ENDOSCOPIC RETROGRADE CHOLANGIOPANCREATOGRAPHY (ERCP);  Surgeon: Rogene Houston, MD;  [facility];  Service: Endoscopy;  Laterality: N/A;  . ESOPHAGOGASTRODUODENOSCOPY  07/2010   RMR: schatki ring s/p dilation, small hh, SB bx benign  . ESOPHAGOGASTRODUODENOSCOPY N/A 09/04/2015   Procedure: ESOPHAGOGASTRODUODENOSCOPY (EGD);  Surgeon: Daneil Dolin, MD;  [facility];  Service: Endoscopy;  Laterality: N/A;  730  . EUS  08/2010   Dr. Owens Loffler. uT3N0 circumferential, nearly obstruction rectosigmoid adenocarcinoma, distal edge 12cm from anal verge  . EUS N/A 06/19/2016    Procedure: UPPER ENDOSCOPIC ULTRASOUND (EUS) RADIAL;  Surgeon: Milus Banister, MD;  Location: [facility];  Service: Endoscopy;  Laterality: N/A;  . FLEXIBLE SIGMOIDOSCOPY N/A 09/24/2018   Procedure: FLEXIBLE SIGMOIDOSCOPY;  Surgeon: Danie Binder, MD;  [facility];  Service: Endoscopy;  Laterality: N/A;  . HERNIA REPAIR     abd hernia repair  . IVC filter    . ivc filter    . port a cath placement    . PORT-A-CATH REMOVAL  09/24/2011   Procedure: REMOVAL PORT-A-CATH;  Surgeon: Donato Heinz, MD;  Location: [facility];  Service: General;  Laterality: N/A;  Minor Room  . PORTACATH PLACEMENT Right 07/07/2016   Procedure: INSERTION PORT-A-CATH;  Surgeon: Aviva Signs, MD;  Location: [facility];  Service: General;  Laterality: Right;  . SPHINCTEROTOMY N/A 09/16/2018   Procedure: SPHINCTEROTOMY with balloon dialation;  Surgeon: Rogene Houston, MD;  [facility];  Service: Endoscopy;  Laterality: N/A;  . TRANSVERSE LOOP COLOSTOMY N/A 09/29/2018   Procedure: TRANSVERSE LOOP COLOSTOMY;  Surgeon: Aviva Signs, MD;  Location: [facility];  Service: General;  Laterality: N/A;  . VIDEO ASSISTED THORACOSCOPY (VATS)/WEDGE RESECTION Right 11/15/2014   Procedure: VIDEO ASSISTED THORACOSCOPY (VATS)/LUNG RESECTION WITH RIGHT LINGULECTOMY;  Surgeon: [name], MD;  Location: [facility];  Service: Thoracic;  Laterality: Right;  Marland Kitchen VIDEO BRONCHOSCOPY N/A 11/15/2014   Procedure: VIDEO BRONCHOSCOPY;  Surgeon: [name], MD;  Location: [facility] OR;  Service: Thoracic;  Laterality: N/A;     SOCIAL HISTORY:  Social History   Socioeconomic History  . Marital status: Married    Spouse name: Not on file  . Number of children: 2  . Years of education: Not on file  . Highest education level: Not on file  Occupational History  . Occupation: self-employed Regulatory affairs officer, currently not working    Fish farm manager: SELF EMPLOYED  Social Needs  . Financial resource strain: Not on file  . Food  insecurity:    Worry: Not on file    Inability: Not on file  . Transportation needs:    Medical: Not on file    Non-medical: Not on file  Tobacco Use  . Smoking status: Never Smoker  . Smokeless tobacco: Never Used  Substance and Sexual Activity  . Alcohol use: No  . Drug use: No  . Sexual activity: Yes    Birth control/protection: None    Comment: married  Lifestyle  . Physical activity:    Days per week: Not on file    Minutes per session: Not on file  . Stress: Not on file  Relationships  . Social connections:    Talks on phone: Not on file    Gets together: Not on file    Attends religious service: Not on file    Active member of club or organization: Not on file    Attends meetings of clubs or organizations: Not on file    Relationship status: Not on file  . Intimate partner violence:    Fear of current or ex partner: Not on file    Emotionally abused: Not on file    Physically abused: Not on file    Forced sexual activity: Not on file  Other Topics Concern  . Not on file  Social History Narrative  . Not on file    FAMILY HISTORY:  Family History  Problem Relation Age of Onset  . Cancer Brother        throat  . Cancer Brother        prostate  . Colon cancer Neg Hx   . Liver disease Neg Hx   . Inflammatory bowel disease Neg Hx     CURRENT MEDICATIONS:  Outpatient Encounter Medications as of 10/04/2018  Medication Sig  . acetaminophen (TYLENOL) 500 MG tablet Take 1,000 mg by mouth every 6 (six) hours as needed for mild pain or moderate pain.   . clonazePAM (KLONOPIN) 0.5 MG tablet Take 1 tablet (0.5 mg total) by mouth 2 (two) times daily as needed.  . diphenoxylate-atropine (LOMOTIL) 2.5-0.025 MG tablet Take 1 tablet by mouth 4 (four) times daily as needed for diarrhea or loose stools.  . gabapentin (NEURONTIN) 300 MG capsule Take 3 capsules (900 mg total)  by mouth 2 (two) times daily.  Marland Kitchen glimepiride (AMARYL) 2 MG tablet Take 2 mg by mouth daily with  breakfast.   . mirtazapine (REMERON) 7.5 MG tablet Take 1 tablet (7.5 mg total) by mouth at bedtime. (Patient not taking: Reported on 09/22/2018)  . omeprazole (PRILOSEC) 20 MG capsule Take 20 mg by mouth 2 (two) times daily before a meal.   . ondansetron (ZOFRAN) 8 MG tablet Take 1 tablet (8 mg total) by mouth 2 (two) times daily as needed for refractory nausea / vomiting. Start on day 3 after chemotherapy.  Marland Kitchen oxyCODONE-acetaminophen (PERCOCET) 10-325 MG tablet Take 1 tablet by mouth every 6 (six) hours as needed for pain.  . Probiotic Product (PROBIOTIC DAILY PO) Take 1 capsule by mouth daily.  . rivaroxaban (XARELTO) 20 MG TABS tablet Take 1 tablet (20 mg total) by mouth daily with supper.  . traZODone (DESYREL) 100 MG tablet Take 1 tablet (100 mg total) by mouth at bedtime.  . vitamin B-12 (CYANOCOBALAMIN) 1000 MCG tablet Take 1,000 mcg by mouth daily.   Facility-Administered Encounter Medications as of 10/04/2018  Medication  . sodium chloride 0.9 % injection 10 mL  . sodium chloride flush (NS) 0.9 % injection 10 mL    ALLERGIES:  Allergies  Allergen Reactions  . Oxycodone     Blisters, hallucinations  . Tramadol     Blisters, hallucinations  . Trazodone And Nefazodone Other (See Comments)    hallucinations     PHYSICAL EXAM:  ECOG Performance status: 1  I reviewed vitals.  Blood pressure is 134/73.  Pulse rate is 95.  Respirate is 18.  Temperature 98.  Physical Exam Vitals signs reviewed.  Constitutional:      Appearance: Normal appearance.  Cardiovascular:     Rate and Rhythm: Normal rate and regular rhythm.     Heart sounds: Normal heart sounds.  Pulmonary:     Effort: Pulmonary effort is normal.     Breath sounds: Normal breath sounds.  Abdominal:     General: There is no distension.     Palpations: Abdomen is soft. There is no mass.  Musculoskeletal:        General: No swelling.  Skin:    General: Skin is warm.  Neurological:     General: No focal deficit  present.     Mental Status: He is alert and oriented to person, place, and time.  Psychiatric:        Mood and Affect: Mood normal.        Behavior: Behavior normal.      LABORATORY DATA:  I have reviewed the labs as listed.  CBC    Component Value Date/Time   WBC 8.3 10/04/2018 0810   RBC 3.75 (L) 10/04/2018 0810   HGB 12.0 (L) 10/04/2018 0810   HGB 11.6 (L) 02/04/2011 1059   HCT 36.6 (L) 10/04/2018 0810   HCT 34.7 (L) 02/04/2011 1059   PLT 285 10/04/2018 0810   PLT 282 02/04/2011 1059   MCV 97.6 10/04/2018 0810   MCV 76.0 (L) 02/04/2011 1059   MCH 32.0 10/04/2018 0810   MCHC 32.8 10/04/2018 0810   RDW 13.6 10/04/2018 0810   RDW 18.5 (H) 02/04/2011 1059   LYMPHSABS 1.4 10/04/2018 0810   LYMPHSABS 1.1 02/04/2011 1059   MONOABS 0.5 10/04/2018 0810   MONOABS 0.5 02/04/2011 1059   EOSABS 0.4 10/04/2018 0810   EOSABS 0.1 02/04/2011 1059   BASOSABS 0.0 10/04/2018 0810   BASOSABS 0.1 02/04/2011 1059  CMP Latest Ref Rng & Units 10/04/2018 09/30/2018 09/29/2018  Glucose 70 - 99 mg/dL 248(H) 130(H) 139(H)  BUN 8 - 23 mg/dL 8 7(L) 7(L)  Creatinine 0.61 - 1.24 mg/dL 0.68 0.65 0.69  Sodium 135 - 145 mmol/L 139 137 139  Potassium 3.5 - 5.1 mmol/L 3.3(L) 4.0 3.4(L)  Chloride 98 - 111 mmol/L 102 103 102  CO2 22 - 32 mmol/L _0 Calcium 8.9 - 10.3 mg/dL 8.3(L) 8.0(L) 8.4(L)  Total Protein 6.5 - 8.1 g/dL 5.5(L) - -  Total Bilirubin 0.3 - 1.2 mg/dL 0.6 - -  Alkaline Phos 38 - 126 U/L 102 - -  AST 15 - 41 U/L 18 - -  ALT 0 - 44 U/L 22 - -       DIAGNOSTIC IMAGING:  I have independently reviewed the scans and discussed with the patient.    ASSESSMENT & PLAN:   Rectal cancer 1.  Stage IV rectal cancer with lung and pancreatic metastasis: -12 cycles of FOLFOX in the adjuvant setting from October 2012 through 07/14/2011. -15 cycles of FOLFOX and bevacizumab from 07/08/2016 through 01/20/2017, followed by infusional 5-FU and bevacizumab until 07/14/2017 -Maintenance Xeloda  started on 08/07/2017 with bevacizumab, dose reduced to 1500 mg twice daily during cycle 2.  Prior to that he was on 5-FU maintenance. -CT CAP on 07/30/2018 showed no significant progressive metastatic disease.  No significant change in the right upper lobe solid pulmonary nodules.  Stable mixed lytic and sclerotic lower sternal lesion.  Stable malignant pancreatic head mass. -MRCP showed intrahepatic and extrahepatic biliary dilatation with stricture at common bile duct.  Mass in the head of the pancreas has increased in size to 2.1 cm, previously 1.7 cm. -ERCP and stent placement on 09/16/2018, showing a single localized biliary stricture.  Biliary sphincterotomy was also performed and stricture dilated to 6 mm with the balloon to facilitate stent placement.  1 covered metal stent was placed. -Flexible sigmoidoscopy on 09/24/2018 shows narrowing of the colorectal anastomosis. - He underwent transverse loop colostomy on 09/29/2018. - Energy levels are fair.  As there is progression, I have recommended changing chemotherapy to FOLFIRI and bevacizumab. - We talked about the side effects of this regimen in detail. -He reports loss of appetite.  I will start him on Marinol 2.5 mg twice daily.  If his insurance does not approve, will consider Megace. -I will see him back in 2 weeks for follow-up.  2.  Peripheral neuropathy: -We will continue gabapentin 900 mg twice daily.   3.  DVT and PE: -Diagnosed in 2016, status post IVC filter. -Xarelto was temporarily held.  He started back on it.  4.  Rectal pain: -He will continue hydrocodone as needed.  5.  Sleep problems: -Trazodone 100 mg tablet at bedtime is helping.     Total time spent is 40 minutes with more than 50% of the time spent face-to-face discussing change in therapy, side effects and coordination of care.  Orders placed this encounter:  No orders of the defined types were placed in this encounter.     Derek Jack, MD [place] [phone]

## 2018-10-04 NOTE — Assessment & Plan Note (Addendum)
1.  Stage IV rectal cancer with lung and pancreatic metastasis: -12 cycles of FOLFOX in the adjuvant setting from October 2012 through 07/14/2011. -15 cycles of FOLFOX and bevacizumab from 07/08/2016 through 01/20/2017, followed by infusional 5-FU and bevacizumab until 07/14/2017 -Maintenance Xeloda started on 08/07/2017 with bevacizumab, dose reduced to 1500 mg twice daily during cycle 2.  Prior to that he was on 5-FU maintenance. -CT CAP on 07/30/2018 showed no significant progressive metastatic disease.  No significant change in the right upper lobe solid pulmonary nodules.  Stable mixed lytic and sclerotic lower sternal lesion.  Stable malignant pancreatic head mass. -MRCP showed intrahepatic and extrahepatic biliary dilatation with stricture at common bile duct.  Mass in the head of the pancreas has increased in size to 2.1 cm, previously 1.7 cm. -ERCP and stent placement on 09/16/2018, showing a single localized biliary stricture.  Biliary sphincterotomy was also performed and stricture dilated to 6 mm with the balloon to facilitate stent placement.  1 covered metal stent was placed. -Flexible sigmoidoscopy on 09/24/2018 shows narrowing of the colorectal anastomosis. - He underwent transverse loop colostomy on 09/29/2018. - Energy levels are fair.  As there is progression, I have recommended changing chemotherapy to FOLFIRI and bevacizumab. - We talked about the side effects of this regimen in detail. -He reports loss of appetite.  I will start him on Marinol 2.5 mg twice daily.  If his insurance does not approve, will consider Megace. -I will see him back in 2 weeks for follow-up.  2.  Peripheral neuropathy: -We will continue gabapentin 900 mg twice daily.   3.  DVT and PE: -Diagnosed in 2016, status post IVC filter. -Xarelto was temporarily held.  He started back on it.  4.  Rectal pain: -He will continue hydrocodone as needed.  5.  Sleep problems: -Trazodone 100 mg tablet at bedtime is  helping.

## 2018-10-04 NOTE — Patient Instructions (Signed)
[facility] at [facility] Discharge Instructions  You were seen today by Dr. Katragadda. He went over your recent lab results. He will see you back in 2 weeks for labs and follow up.   Thank you for choosing [facility] at [facility] to provide your oncology and hematology care.  To afford each patient quality time with our provider, please arrive at least 15 minutes before your scheduled appointment time.   If you have a lab appointment with the Cancer Center please come in thru the  Main Entrance and check in at the main information desk  You need to re-schedule your appointment should you arrive 10 or more minutes late.  We strive to give you quality time with our providers, and arriving late affects you and other patients whose appointments are after yours.  Also, if you no show three or more times for appointments you may be dismissed from the clinic at the providers discretion.     Again, thank you for choosing [facility].  Our hope is that these requests will decrease the amount of time that you wait before being seen by our physicians.       _____________________________________________________________  Should you have questions after your visit to [facility], please contact our office at [phone] between the hours of 8:00 a.m. and 4:30 p.m.  Voicemails left after 4:00 p.m. will not be returned until the following business day.  For prescription refill requests, have your pharmacy contact our office and allow 72 hours.    Cancer Center Support Programs:   > Cancer Support Group  2nd Tuesday of the month 1pm-2pm, Journey Room    

## 2018-10-04 NOTE — Progress Notes (Signed)
Patient to treatment area for FOLFIRI plus Avastin.  Recent colostomy surgery last week.  Fair appetite and energy level.  Denied pain.  Dr. [name] notified with verbal order to add on for oncology follow up visit for today.  No s/s of distress noted.  Patient seen by Dr. [name] with lab review and ok to treat today.  No Avastin today verbal order.  Pharmacy notified.  Consent signed with information given and reviewed.  All questions asked and answered.    Patient tolerated chemotherapy with no complaints voiced.  Port site clean and dry with no bruising or swelling noted at site.  Good blood return noted before and after administration of chemotherapy.  Tegaderm dressing intact.  Chemotherapy pump connected with no alarms noted.  Chemo spill kit given and reviewed numbers to call for any questions or concerns.  Patient verbalized understanding.  Patient left by wheelchair with VSS and no s/s of distress noted.

## 2018-10-05 ENCOUNTER — Telehealth: Payer: Self-pay

## 2018-10-05 ENCOUNTER — Ambulatory Visit (INDEPENDENT_AMBULATORY_CARE_PROVIDER_SITE_OTHER): Payer: Self-pay | Admitting: General Surgery

## 2018-10-05 ENCOUNTER — Encounter: Payer: Self-pay | Admitting: General Surgery

## 2018-10-05 VITALS — BP 147/79 | HR 89 | Temp 97.7°F | Resp 18 | Wt 196.0 lb

## 2018-10-05 DIAGNOSIS — Z09 Encounter for follow-up examination after completed treatment for conditions other than malignant neoplasm: Secondary | ICD-10-CM

## 2018-10-05 NOTE — Telephone Encounter (Signed)
Home Health Nurse, Arbie Cookey, called after home visit with patient. Arbie Cookey says that his family is concerned because his stoma has went from beefy red to being darker and having dark areas.

## 2018-10-05 NOTE — Progress Notes (Signed)
Subjective:     Charles [name]  Patient here for follow-up status post transverse loop colostomy.  Visiting home health nurse was concerned about some darkness in the tissue of the colostomy.  He has been working very well.  Patient is pleased with the results.  He denies any bleeding from the colostomy.  He is on Xarelto. Objective:    BP (!) 147/79 (BP Location: Left Arm, Patient Position: Sitting, Cuff Size: Normal)   Pulse 89   Temp 97.7 F (36.5 C) (Temporal)   Resp 18   Wt 196 lb (88.9 kg)   SpO2 97%   BMI 25.16 kg/m   General:  alert, cooperative and no distress  Abdomen is soft.  Colostomy with superficial serosal necrosis along the superior aspect of the loop.  The mucosa is pink and viable.  No bleeding is noted.  The bar was removed.  The colostomy appliance was changed.     Assessment:    Doing well postoperatively.    Plan:   I reassured the patient that this superficial surface changes will not affect the colostomy function.  They will probably slough off.  Will see the patient again in 2 weeks for follow-up.

## 2018-10-06 ENCOUNTER — Inpatient Hospital Stay (HOSPITAL_COMMUNITY): Payer: Medicare Other

## 2018-10-06 ENCOUNTER — Encounter (HOSPITAL_COMMUNITY): Payer: Self-pay

## 2018-10-06 ENCOUNTER — Other Ambulatory Visit: Payer: Self-pay

## 2018-10-06 VITALS — BP 114/69 | HR 95 | Temp 98.9°F | Resp 18

## 2018-10-06 DIAGNOSIS — C7889 Secondary malignant neoplasm of other digestive organs: Secondary | ICD-10-CM | POA: Diagnosis not present

## 2018-10-06 DIAGNOSIS — C20 Malignant neoplasm of rectum: Secondary | ICD-10-CM

## 2018-10-06 DIAGNOSIS — C7801 Secondary malignant neoplasm of right lung: Secondary | ICD-10-CM | POA: Diagnosis not present

## 2018-10-06 DIAGNOSIS — C7802 Secondary malignant neoplasm of left lung: Secondary | ICD-10-CM | POA: Diagnosis not present

## 2018-10-06 DIAGNOSIS — Z5111 Encounter for antineoplastic chemotherapy: Secondary | ICD-10-CM | POA: Diagnosis not present

## 2018-10-06 DIAGNOSIS — G893 Neoplasm related pain (acute) (chronic): Secondary | ICD-10-CM | POA: Diagnosis not present

## 2018-10-06 DIAGNOSIS — C7800 Secondary malignant neoplasm of unspecified lung: Secondary | ICD-10-CM

## 2018-10-06 DIAGNOSIS — R110 Nausea: Secondary | ICD-10-CM

## 2018-10-06 MED ORDER — HEPARIN SOD (PORK) LOCK FLUSH 100 UNIT/ML IV SOLN
500.0000 [IU] | Freq: Once | INTRAVENOUS | Status: AC | PRN
  Administered 2018-10-06: 500 [IU]

## 2018-10-06 MED ORDER — PROCHLORPERAZINE MALEATE 10 MG PO TABS: 10.0000 mg | ORAL_TABLET | Freq: Four times a day (QID) | ORAL | 0 refills | Status: DC | PRN

## 2018-10-06 MED ORDER — SODIUM CHLORIDE 0.9% FLUSH
10.0000 mL | INTRAVENOUS | Status: DC | PRN
  Administered 2018-10-06: 10 mL
  Filled 2018-10-06: qty 10

## 2018-10-06 NOTE — Progress Notes (Signed)
Patient to treatment area for chemo pump disconnect.  No complaints of pain with flush.  Good blood return noted.  Band aid applied.  VSS with discharge and left ambulatory with no s/s of distress noted.

## 2018-10-07 ENCOUNTER — Telehealth (HOSPITAL_COMMUNITY): Payer: Self-pay

## 2018-10-07 ENCOUNTER — Ambulatory Visit: Payer: Self-pay | Admitting: General Surgery

## 2018-10-07 DIAGNOSIS — K9189 Other postprocedural complications and disorders of digestive system: Secondary | ICD-10-CM | POA: Diagnosis not present

## 2018-10-07 DIAGNOSIS — T451X5A Adverse effect of antineoplastic and immunosuppressive drugs, initial encounter: Secondary | ICD-10-CM

## 2018-10-07 DIAGNOSIS — T451X5S Adverse effect of antineoplastic and immunosuppressive drugs, sequela: Secondary | ICD-10-CM | POA: Diagnosis not present

## 2018-10-07 DIAGNOSIS — G620 Drug-induced polyneuropathy: Secondary | ICD-10-CM | POA: Diagnosis not present

## 2018-10-07 DIAGNOSIS — E1151 Type 2 diabetes mellitus with diabetic peripheral angiopathy without gangrene: Secondary | ICD-10-CM | POA: Diagnosis not present

## 2018-10-07 DIAGNOSIS — K1379 Other lesions of oral mucosa: Secondary | ICD-10-CM

## 2018-10-07 DIAGNOSIS — Z433 Encounter for attention to colostomy: Secondary | ICD-10-CM | POA: Diagnosis not present

## 2018-10-07 DIAGNOSIS — C20 Malignant neoplasm of rectum: Secondary | ICD-10-CM

## 2018-10-07 DIAGNOSIS — C250 Malignant neoplasm of head of pancreas: Secondary | ICD-10-CM | POA: Diagnosis not present

## 2018-10-07 MED ORDER — FLUCONAZOLE 100 MG PO TABS: 100.0000 mg | ORAL_TABLET | Freq: Every day | ORAL | 0 refills | Status: AC

## 2018-10-07 MED ORDER — FIRST-DUKES MOUTHWASH MT SUSP: 5.0000 mL | Freq: Four times a day (QID) | OROMUCOSAL | 3 refills | Status: DC

## 2018-10-07 NOTE — Telephone Encounter (Signed)
Chemotherapy 24 hour phone call.  Spoke with the wife who stated the compazine for nausea was helping but still having some prn nausea.  He has zofran at home that states start on day 3 after chemotherapy.  Instructed the wife he could start the zofran today and to call if he has no relief of his nausea.    The home health nurse is checking on the patient daily and has noted yeast in his upper and lower mouth.  Requesting a refill for his mouthwash. Reviewed with Dr. [name] with verbal order ok to refill Dukes Magic Mouthwash and send Diflucan 100mg  for #5 tabs to take one daily.  Prescription sent to [facility] in [place].    No other complaints voiced.  The wife was also instructed by the Home health nurse to start Desitin for his bottom area.  Instructed the wife to call for any questions or concerns with understanding verbalized.

## 2018-10-08 DIAGNOSIS — C189 Malignant neoplasm of colon, unspecified: Secondary | ICD-10-CM | POA: Diagnosis not present

## 2018-10-08 DIAGNOSIS — C20 Malignant neoplasm of rectum: Secondary | ICD-10-CM | POA: Diagnosis not present

## 2018-10-08 DIAGNOSIS — C799 Secondary malignant neoplasm of unspecified site: Secondary | ICD-10-CM | POA: Diagnosis not present

## 2018-10-08 DIAGNOSIS — B3781 Candidal esophagitis: Secondary | ICD-10-CM | POA: Diagnosis not present

## 2018-10-08 DIAGNOSIS — Z6824 Body mass index (BMI) 24.0-24.9, adult: Secondary | ICD-10-CM | POA: Diagnosis not present

## 2018-10-08 DIAGNOSIS — C7801 Secondary malignant neoplasm of right lung: Secondary | ICD-10-CM | POA: Diagnosis not present

## 2018-10-08 DIAGNOSIS — Z299 Encounter for prophylactic measures, unspecified: Secondary | ICD-10-CM | POA: Diagnosis not present

## 2018-10-11 DIAGNOSIS — Z433 Encounter for attention to colostomy: Secondary | ICD-10-CM | POA: Diagnosis not present

## 2018-10-11 DIAGNOSIS — C250 Malignant neoplasm of head of pancreas: Secondary | ICD-10-CM | POA: Diagnosis not present

## 2018-10-11 DIAGNOSIS — T451X5S Adverse effect of antineoplastic and immunosuppressive drugs, sequela: Secondary | ICD-10-CM | POA: Diagnosis not present

## 2018-10-11 DIAGNOSIS — K9189 Other postprocedural complications and disorders of digestive system: Secondary | ICD-10-CM | POA: Diagnosis not present

## 2018-10-11 DIAGNOSIS — E1151 Type 2 diabetes mellitus with diabetic peripheral angiopathy without gangrene: Secondary | ICD-10-CM | POA: Diagnosis not present

## 2018-10-11 DIAGNOSIS — G620 Drug-induced polyneuropathy: Secondary | ICD-10-CM | POA: Diagnosis not present

## 2018-10-12 DIAGNOSIS — T451X5S Adverse effect of antineoplastic and immunosuppressive drugs, sequela: Secondary | ICD-10-CM | POA: Diagnosis not present

## 2018-10-12 DIAGNOSIS — C250 Malignant neoplasm of head of pancreas: Secondary | ICD-10-CM | POA: Diagnosis not present

## 2018-10-12 DIAGNOSIS — E1151 Type 2 diabetes mellitus with diabetic peripheral angiopathy without gangrene: Secondary | ICD-10-CM | POA: Diagnosis not present

## 2018-10-12 DIAGNOSIS — G620 Drug-induced polyneuropathy: Secondary | ICD-10-CM | POA: Diagnosis not present

## 2018-10-12 DIAGNOSIS — Z433 Encounter for attention to colostomy: Secondary | ICD-10-CM | POA: Diagnosis not present

## 2018-10-12 DIAGNOSIS — K9189 Other postprocedural complications and disorders of digestive system: Secondary | ICD-10-CM | POA: Diagnosis not present

## 2018-10-14 DIAGNOSIS — T451X5S Adverse effect of antineoplastic and immunosuppressive drugs, sequela: Secondary | ICD-10-CM | POA: Diagnosis not present

## 2018-10-14 DIAGNOSIS — K9189 Other postprocedural complications and disorders of digestive system: Secondary | ICD-10-CM | POA: Diagnosis not present

## 2018-10-14 DIAGNOSIS — E1151 Type 2 diabetes mellitus with diabetic peripheral angiopathy without gangrene: Secondary | ICD-10-CM | POA: Diagnosis not present

## 2018-10-14 DIAGNOSIS — Z433 Encounter for attention to colostomy: Secondary | ICD-10-CM | POA: Diagnosis not present

## 2018-10-14 DIAGNOSIS — C250 Malignant neoplasm of head of pancreas: Secondary | ICD-10-CM | POA: Diagnosis not present

## 2018-10-14 DIAGNOSIS — G620 Drug-induced polyneuropathy: Secondary | ICD-10-CM | POA: Diagnosis not present

## 2018-10-15 ENCOUNTER — Other Ambulatory Visit: Payer: Self-pay

## 2018-10-15 ENCOUNTER — Encounter (HOSPITAL_COMMUNITY): Payer: Self-pay | Admitting: Hematology

## 2018-10-15 ENCOUNTER — Telehealth (HOSPITAL_COMMUNITY): Payer: Self-pay | Admitting: *Deleted

## 2018-10-15 NOTE — Telephone Encounter (Signed)
Patient's wife called the clinc stating the patient is having nausea,weakness, small amounts of vomiting with episodes of heaving. She also stated he has no appetite and he's also having diarrhea. Provider made aware of patient's condition. Provider recommended the patient take Zofran every 8 hours and also take compazine every 6 hours, and take imodium 2 mg tablets with episodes of diarrhea up to 8 times a day.

## 2018-10-16 ENCOUNTER — Other Ambulatory Visit: Payer: Self-pay

## 2018-10-16 ENCOUNTER — Observation Stay (HOSPITAL_COMMUNITY)
Admission: EM | Admit: 2018-10-16 | Discharge: 2018-10-17 | Disposition: A | Discharge status: Home / Self Care | Payer: Medicare Other | Attending: Internal Medicine | Admitting: Internal Medicine

## 2018-10-16 ENCOUNTER — Emergency Department (HOSPITAL_COMMUNITY): Payer: Medicare Other

## 2018-10-16 ENCOUNTER — Encounter (HOSPITAL_COMMUNITY): Payer: Self-pay | Admitting: Emergency Medicine

## 2018-10-16 DIAGNOSIS — R197 Diarrhea, unspecified: Secondary | ICD-10-CM

## 2018-10-16 DIAGNOSIS — Z20828 Contact with and (suspected) exposure to other viral communicable diseases: Secondary | ICD-10-CM | POA: Insufficient documentation

## 2018-10-16 DIAGNOSIS — Z7901 Long term (current) use of anticoagulants: Secondary | ICD-10-CM | POA: Insufficient documentation

## 2018-10-16 DIAGNOSIS — E860 Dehydration: Principal | ICD-10-CM

## 2018-10-16 DIAGNOSIS — Z85048 Personal history of other malignant neoplasm of rectum, rectosigmoid junction, and anus: Secondary | ICD-10-CM | POA: Diagnosis not present

## 2018-10-16 DIAGNOSIS — Z9189 Other specified personal risk factors, not elsewhere classified: Secondary | ICD-10-CM

## 2018-10-16 DIAGNOSIS — R6510 Systemic inflammatory response syndrome (SIRS) of non-infectious origin without acute organ dysfunction: Secondary | ICD-10-CM | POA: Diagnosis not present

## 2018-10-16 DIAGNOSIS — Z86711 Personal history of pulmonary embolism: Secondary | ICD-10-CM | POA: Insufficient documentation

## 2018-10-16 DIAGNOSIS — I10 Essential (primary) hypertension: Secondary | ICD-10-CM | POA: Diagnosis not present

## 2018-10-16 DIAGNOSIS — R112 Nausea with vomiting, unspecified: Secondary | ICD-10-CM | POA: Diagnosis not present

## 2018-10-16 DIAGNOSIS — E119 Type 2 diabetes mellitus without complications: Secondary | ICD-10-CM | POA: Insufficient documentation

## 2018-10-16 DIAGNOSIS — Z79899 Other long term (current) drug therapy: Secondary | ICD-10-CM | POA: Diagnosis not present

## 2018-10-16 DIAGNOSIS — Z978 Presence of other specified devices: Secondary | ICD-10-CM | POA: Diagnosis not present

## 2018-10-16 DIAGNOSIS — R918 Other nonspecific abnormal finding of lung field: Secondary | ICD-10-CM | POA: Diagnosis not present

## 2018-10-16 LAB — CBC WITH DIFFERENTIAL/PLATELET
Abs Immature Granulocytes: 0.01 10*3/uL (ref 0.00–0.07)
Basophils Absolute: 0 10*3/uL (ref 0.0–0.1)
Basophils Relative: 1 %
Eosinophils Absolute: 0.3 10*3/uL (ref 0.0–0.5)
Eosinophils Relative: 7 %
HCT: 33.4 % — ABNORMAL LOW (ref 39.0–52.0)
Hemoglobin: 11.2 g/dL — ABNORMAL LOW (ref 13.0–17.0)
Immature Granulocytes: 0 %
Lymphocytes Relative: 32 %
Lymphs Abs: 1.1 10*3/uL (ref 0.7–4.0)
MCH: 31.5 pg (ref 26.0–34.0)
MCHC: 33.5 g/dL (ref 30.0–36.0)
MCV: 94.1 fL (ref 80.0–100.0)
Monocytes Absolute: 0.6 10*3/uL (ref 0.1–1.0)
Monocytes Relative: 16 %
Neutro Abs: 1.6 10*3/uL — ABNORMAL LOW (ref 1.7–7.7)
Neutrophils Relative %: 44 %
Platelets: 202 10*3/uL (ref 150–400)
RBC: 3.55 MIL/uL — ABNORMAL LOW (ref 4.22–5.81)
RDW: 13.6 % (ref 11.5–15.5)
WBC: 3.6 10*3/uL — ABNORMAL LOW (ref 4.0–10.5)
nRBC: 0 % (ref 0.0–0.2)

## 2018-10-16 LAB — PROTIME-INR
INR: 1.4 — ABNORMAL HIGH (ref 0.8–1.2)
Prothrombin Time: 17.3 seconds — ABNORMAL HIGH (ref 11.4–15.2)

## 2018-10-16 LAB — COMPREHENSIVE METABOLIC PANEL
ALT: 19 U/L (ref 0–44)
AST: 20 U/L (ref 15–41)
Albumin: 2.8 g/dL — ABNORMAL LOW (ref 3.5–5.0)
Alkaline Phosphatase: 79 U/L (ref 38–126)
Anion gap: 11 (ref 5–15)
BUN: 15 mg/dL (ref 8–23)
CO2: 24 mmol/L (ref 22–32)
Calcium: 8.3 mg/dL — ABNORMAL LOW (ref 8.9–10.3)
Chloride: 97 mmol/L — ABNORMAL LOW (ref 98–111)
Creatinine, Ser: 0.84 mg/dL (ref 0.61–1.24)
GFR calc Af Amer: >60 mL/min (ref >60)
GFR calc non Af Amer: >60 mL/min (ref >60)
Glucose, Bld: 217 mg/dL — ABNORMAL HIGH (ref 70–99)
Potassium: 4.1 mmol/L (ref 3.5–5.1)
Sodium: 132 mmol/L — ABNORMAL LOW (ref 135–145)
Total Bilirubin: 1 mg/dL (ref 0.3–1.2)
Total Protein: 6.1 g/dL — ABNORMAL LOW (ref 6.5–8.1)

## 2018-10-16 LAB — URINALYSIS, ROUTINE W REFLEX MICROSCOPIC
Bacteria, UA: NONE SEEN
Bilirubin Urine: NEGATIVE
Glucose, UA: NEGATIVE mg/dL
Hgb urine dipstick: NEGATIVE
Ketones, ur: NEGATIVE mg/dL
Leukocytes,Ua: NEGATIVE
Nitrite: NEGATIVE
Protein, ur: 30 mg/dL — AB
Specific Gravity, Urine: 1.025 (ref 1.005–1.030)
pH: 5 (ref 5.0–8.0)

## 2018-10-16 LAB — GLUCOSE, CAPILLARY: Glucose-Capillary: 217 mg/dL — ABNORMAL HIGH (ref 70–99)

## 2018-10-16 LAB — TROPONIN I: Troponin I: <0.03 ng/mL (ref <0.03)

## 2018-10-16 LAB — SARS CORONAVIRUS 2 BY RT PCR (HOSPITAL ORDER, PERFORMED IN ~~LOC~~ HOSPITAL LAB): SARS Coronavirus 2: NEGATIVE

## 2018-10-16 LAB — LACTIC ACID, PLASMA
Lactic Acid, Venous: 1.9 mmol/L (ref 0.5–1.9)
Lactic Acid, Venous: 1.7 mmol/L (ref 0.5–1.9)

## 2018-10-16 LAB — LIPASE, BLOOD: Lipase: 24 U/L (ref 11–51)

## 2018-10-16 LAB — MAGNESIUM: Magnesium: 1.8 mg/dL (ref 1.7–2.4)

## 2018-10-16 MED ORDER — SODIUM CHLORIDE 0.9 % IV SOLN
1000.0000 mL | INTRAVENOUS | Status: DC
  Administered 2018-10-16 – 2018-10-17 (×2): 1000 mL via INTRAVENOUS

## 2018-10-16 MED ORDER — SODIUM CHLORIDE 0.9% FLUSH: 3.0000 mL | Freq: Once | INTRAVENOUS | Status: DC

## 2018-10-16 MED ORDER — VANCOMYCIN HCL IN DEXTROSE 1-5 GM/200ML-% IV SOLN
1000.0000 mg | Freq: Once | INTRAVENOUS | Status: AC
  Administered 2018-10-16: 1000 mg via INTRAVENOUS
  Filled 2018-10-16: qty 200

## 2018-10-16 MED ORDER — METRONIDAZOLE IN NACL 5-0.79 MG/ML-% IV SOLN
500.0000 mg | Freq: Once | INTRAVENOUS | Status: AC
  Administered 2018-10-16: 500 mg via INTRAVENOUS
  Filled 2018-10-16: qty 100

## 2018-10-16 MED ORDER — INSULIN ASPART 100 UNIT/ML ~~LOC~~ SOLN: 0.0000 [IU] | Freq: Three times a day (TID) | SUBCUTANEOUS | Status: DC

## 2018-10-16 MED ORDER — ACETAMINOPHEN 650 MG RE SUPP: 650.0000 mg | Freq: Four times a day (QID) | RECTAL | Status: DC | PRN

## 2018-10-16 MED ORDER — ENOXAPARIN SODIUM 40 MG/0.4ML ~~LOC~~ SOLN: 40.0000 mg | SUBCUTANEOUS | Status: DC

## 2018-10-16 MED ORDER — CLONAZEPAM 0.5 MG PO TABS: 0.5000 mg | ORAL_TABLET | Freq: Two times a day (BID) | ORAL | Status: DC | PRN

## 2018-10-16 MED ORDER — SODIUM CHLORIDE 0.9 % IV BOLUS
1000.0000 mL | Freq: Once | INTRAVENOUS | Status: AC
  Administered 2018-10-16: 1000 mL via INTRAVENOUS

## 2018-10-16 MED ORDER — METRONIDAZOLE IN NACL 5-0.79 MG/ML-% IV SOLN
500.0000 mg | Freq: Three times a day (TID) | INTRAVENOUS | Status: DC
  Administered 2018-10-17: 500 mg via INTRAVENOUS
  Filled 2018-10-16: qty 100

## 2018-10-16 MED ORDER — ONDANSETRON HCL 4 MG/2ML IJ SOLN: 4.0000 mg | Freq: Four times a day (QID) | INTRAMUSCULAR | Status: DC | PRN

## 2018-10-16 MED ORDER — SODIUM CHLORIDE 0.9 % IV SOLN
2.0000 g | Freq: Three times a day (TID) | INTRAVENOUS | Status: DC
  Administered 2018-10-17: 2 g via INTRAVENOUS
  Filled 2018-10-16: qty 2

## 2018-10-16 MED ORDER — VANCOMYCIN HCL IN DEXTROSE 750-5 MG/150ML-% IV SOLN
750.0000 mg | Freq: Three times a day (TID) | INTRAVENOUS | Status: DC
  Administered 2018-10-17 (×2): 750 mg via INTRAVENOUS
  Filled 2018-10-16 (×2): qty 150

## 2018-10-16 MED ORDER — SODIUM CHLORIDE 0.9 % IV SOLN
1000.0000 mL | INTRAVENOUS | Status: DC
  Administered 2018-10-16: 1000 mL via INTRAVENOUS

## 2018-10-16 MED ORDER — SODIUM CHLORIDE 0.9 % IV SOLN
2.0000 g | Freq: Once | INTRAVENOUS | Status: AC
  Administered 2018-10-16: 2 g via INTRAVENOUS
  Filled 2018-10-16: qty 2

## 2018-10-16 MED ORDER — ACETAMINOPHEN 325 MG PO TABS: 650.0000 mg | ORAL_TABLET | Freq: Four times a day (QID) | ORAL | Status: DC | PRN

## 2018-10-16 MED ORDER — RIVAROXABAN 20 MG PO TABS: 20.0000 mg | ORAL_TABLET | Freq: Every day | ORAL | Status: DC

## 2018-10-16 MED ORDER — ONDANSETRON HCL 4 MG PO TABS: 4.0000 mg | ORAL_TABLET | Freq: Four times a day (QID) | ORAL | Status: DC | PRN

## 2018-10-16 NOTE — ED Triage Notes (Signed)
Pt states that since he was started on a new chemo drug he has been going down hill. He states that he can not eat or keep any thing down.

## 2018-10-16 NOTE — ED Notes (Signed)
Pt is advised a urine sample is needed, call light and urinal at bedside.

## 2018-10-16 NOTE — H&P (Addendum)
History and Physical    Charles [name] TDD:[id number] DOB: [birth year] DOA: 10/16/2018  PCP: [name], MD   Patient coming from: Home  I have personally briefly reviewed patient's old medical records in [place]  Chief Complaint: Poor Appetite, vomiting,   HPI: Charles [name] is a 68 y.o. male with medical history significant for stage IV rectal cancer with lung and pancreatic metastasis, high output ileostomy, DM2, DVT and PE on anticoagulation, who presented to the ED with complaints of watery stools for 1 week, and temperatures of 100.5 consistently over the past 2 days, for he has been taking Tylenol but he did not take Tylenol today. On my evaluation patient tells me he has had a few episodes of vomiting over the past week, dry heaving yesterday, he has not been able to eat anything due to poor appetite and has been drinking mostly liquids without any solid foods.  At baseline his stools from his colostomy are watery but have been more watery over the past few days.  Reports 2 watery stools per day for the past few days. He came to the ED because he was worried he was dehydrated. He denies abdominal pain, no difficulty breathing or cough.  No contact with sick persons.  No neck pain or stiffness.  No chest pain.  No pain with urination.  Recent hospitalization 5/22- 5/29 : Managed for persistent diarrhea and worsening colorectal stricture.  And had palliative transverse loop colostomy on 09/29/2018.  ED Course:  Initial tachycardia to 108, temperature 99.2,  tachypnea up to 30.  O2 sats greater than 98% on room air.  WBC 3.6.  Lactic acid 1.9.  Sodium 132.  Chest x-ray negative for acute abnormality.  Patient was started on broad-spectrum antibiotics IV vancomycin cefepime and metronidazole.  With concern for sepsis and immunocompromised status, hospitalist was called to admit.  Review of Systems: As per HPI all other systems reviewed and negative.  Past Medical  History:  Diagnosis Date  . Chronic anticoagulation   . Colon cancer ([place]) 07/24/2010   rectal ca, inv adenocarcinoma  . Depression 04/01/2011  . Diabetes mellitus without complication ([place])   . DVT (deep venous thrombosis) ([place]) 05/09/2011  . GERD (gastroesophageal reflux disease)   . High output ileostomy ([place]) 05/21/2011  . History of kidney stones   . HTN (hypertension)   . Hx of radiation therapy 09/02/10 to 10/14/10   pelvis  . Lung metastasis ([place])   . Neuropathy   . Peripheral vascular disease ([place])    dvt's,pe  . Pneumonia    hx x3  . Pulmonary embolism ([place])   . Rectal cancer ([place]) 01/15/2011   S/P radiation and concurrent 5-FU continuous infusion from 09/09/10- 10/10/10.  S/P proctectomy with colorectal anastomosis and diverting loop ileostomy on 11/14/10 at [facility] by Dr. Harlon Ditty. Pathology reveals a pT3b N1 with 3/20 lymph nodes.      Past Surgical History:  Procedure Laterality Date  . BILIARY STENT PLACEMENT N/A 09/16/2018   Procedure: STENT PLACEMENT;  Surgeon: Rogene Houston, MD;  [facility];  Service: Endoscopy;  Laterality: N/A;  . BIOPSY  09/24/2018   Procedure: BIOPSY;  Surgeon: Danie Binder, MD;  [facility];  Service: Endoscopy;;  . COLON SURGERY  11/14/2010   proctectomy with colorectal anastomosis and diverting loop ileostomy (temporary planned)  . COLONOSCOPY  07/2010   proximal rectal apple core mass 10-14cm from anal verge (adenocarcinoma), 2-3cm distal rectal  carpet polyp s/p piecemeal snare polypectomy (adenoma)  . COLONOSCOPY  04/21/2012   RMR: Friable,fibrotic appearing colorectal anastomosis producing some luminal narrowing-not felt to be critical. path: focal erosion with slight inflammation and hyperemia. SURVEILLANCE DUE DEC 2015  . COLONOSCOPY N/A 11/24/2013   Dr. Rourk:somewhat fibrotic/friable anastomotic mucosal-status post biopsy (narrowing not felt to be clinically significant) Single colonic diverticulum. benign  polypoid rectal mucosa  . colostomy reversal  april 2013  . ERCP N/A 09/16/2018   Procedure: ENDOSCOPIC RETROGRADE CHOLANGIOPANCREATOGRAPHY (ERCP);  Surgeon: Rogene Houston, MD;  [facility];  Service: Endoscopy;  Laterality: N/A;  . ESOPHAGOGASTRODUODENOSCOPY  07/2010   RMR: schatki ring s/p dilation, small hh, SB bx benign  . ESOPHAGOGASTRODUODENOSCOPY N/A 09/04/2015   Procedure: ESOPHAGOGASTRODUODENOSCOPY (EGD);  Surgeon: Daneil Dolin, MD;  [facility];  Service: Endoscopy;  Laterality: N/A;  730  . EUS  08/2010   Dr. Owens Loffler. uT3N0 circumferential, nearly obstruction rectosigmoid adenocarcinoma, distal edge 12cm from anal verge  . EUS N/A 06/19/2016   Procedure: UPPER ENDOSCOPIC ULTRASOUND (EUS) RADIAL;  Surgeon: Milus Banister, MD;  Location: [facility];  Service: Endoscopy;  Laterality: N/A;  . FLEXIBLE SIGMOIDOSCOPY N/A 09/24/2018   Procedure: FLEXIBLE SIGMOIDOSCOPY;  Surgeon: Danie Binder, MD;  [facility];  Service: Endoscopy;  Laterality: N/A;  . HERNIA REPAIR     abd hernia repair  . IVC filter    . ivc filter    . port a cath placement    . PORT-A-CATH REMOVAL  09/24/2011   Procedure: REMOVAL PORT-A-CATH;  Surgeon: Donato Heinz, MD;  Location: [facility];  Service: General;  Laterality: N/A;  Minor Room  . PORTACATH PLACEMENT Right 07/07/2016   Procedure: INSERTION PORT-A-CATH;  Surgeon: Aviva Signs, MD;  Location: [facility];  Service: General;  Laterality: Right;  . SPHINCTEROTOMY N/A 09/16/2018   Procedure: SPHINCTEROTOMY with balloon dialation;  Surgeon: Rogene Houston, MD;  [facility];  Service: Endoscopy;  Laterality: N/A;  . TRANSVERSE LOOP COLOSTOMY N/A 09/29/2018   Procedure: TRANSVERSE LOOP COLOSTOMY;  Surgeon: Aviva Signs, MD;  Location: [facility];  Service: General;  Laterality: N/A;  . VIDEO ASSISTED THORACOSCOPY (VATS)/WEDGE RESECTION Right 11/15/2014   Procedure: VIDEO ASSISTED THORACOSCOPY (VATS)/LUNG RESECTION  WITH RIGHT LINGULECTOMY;  Surgeon: [name], MD;  Location: [place];  Service: Thoracic;  Laterality: Right;  Marland Kitchen VIDEO BRONCHOSCOPY N/A 11/15/2014   Procedure: VIDEO BRONCHOSCOPY;  Surgeon: [name], MD;  Location: [place];  Service: Thoracic;  Laterality: N/A;     reports that he has never smoked. He has never used smokeless tobacco. He reports that he does not drink alcohol or use drugs.  Allergies  Allergen Reactions  . Oxycodone     Blisters, hallucinations  . Tramadol     Blisters, hallucinations  . Trazodone And Nefazodone Other (See Comments)    hallucinations    Family History  Problem Relation Age of Onset  . Cancer Brother        throat  . Cancer Brother        prostate  . Colon cancer Neg Hx   . Liver disease Neg Hx   . Inflammatory bowel disease Neg Hx     Prior to Admission medications   Medication Sig Start Date End Date Taking? Authorizing Provider  acetaminophen (TYLENOL) 500 MG tablet Take 1,000 mg by mouth every 6 (six) hours as needed for mild pain or moderate pain.  [provider]  clonazePAM (KLONOPIN) 0.5 MG tablet Take 1 tablet (0.5 mg total) by mouth 2 (two) times daily as needed. 01/26/18   Lockamy, Randi L, NP-C  Diphenhyd-Hydrocort-Nystatin (FIRST-DUKES MOUTHWASH) SUSP Use as directed 5 mLs in the mouth or throat 4 (four) times daily. 10/07/18   Derek Jack, MD  diphenoxylate-atropine (LOMOTIL) 2.5-0.025 MG tablet Take 1 tablet by mouth 4 (four) times daily as needed for diarrhea or loose stools. 08/10/18   Derek Jack, MD  dronabinol (MARINOL) 2.5 MG capsule Take 1 capsule (2.5 mg total) by mouth 2 (two) times daily before a meal. 10/04/18   Derek Jack, MD  gabapentin (NEURONTIN) 300 MG capsule Take 3 capsules (900 mg total) by mouth 2 (two) times daily. 07/23/18   Derek Jack, MD  glimepiride (AMARYL) 2 MG tablet Take 2 mg by mouth daily with breakfast.     [provider]  mirtazapine  (REMERON) 7.5 MG tablet Take 1 tablet (7.5 mg total) by mouth at bedtime. 06/22/18   Lockamy, Randi L, NP-C  omeprazole (PRILOSEC) 20 MG capsule Take 20 mg by mouth 2 (two) times daily before a meal.     [provider]  ondansetron (ZOFRAN) 8 MG tablet Take 1 tablet (8 mg total) by mouth 2 (two) times daily as needed for refractory nausea / vomiting. Start on day 3 after chemotherapy. 10/01/18   Roxan Hockey, MD  oxyCODONE-acetaminophen (PERCOCET) 10-325 MG tablet Take 1 tablet by mouth every 6 (six) hours as needed for pain. 10/01/18   Roxan Hockey, MD  Probiotic Product (PROBIOTIC DAILY PO) Take 1 capsule by mouth daily.    [provider]  prochlorperazine (COMPAZINE) 10 MG tablet Take 1 tablet (10 mg total) by mouth every 6 (six) hours as needed for nausea or vomiting. 10/06/18   Derek Jack, MD  rivaroxaban (XARELTO) 20 MG TABS tablet Take 1 tablet (20 mg total) by mouth daily with supper. 09/19/18   Rogene Houston, MD  traZODone (DESYREL) 100 MG tablet Take 1 tablet (100 mg total) by mouth at bedtime. 09/20/18   Derek Jack, MD  vitamin B-12 (CYANOCOBALAMIN) 1000 MCG tablet Take 1,000 mcg by mouth daily.    [provider]    Physical Exam: Vitals:   10/16/18 2000 10/16/18 2015 10/16/18 2030 10/16/18 2045  BP: 124/70 125/71 123/72 115/69  Pulse: 96 96 93 94  Resp: (!) 24 (!) 26 18 17   Temp:      TempSrc:      SpO2: 100% 99% 100% 99%  Weight:      Height:        Constitutional: NAD, calm, comfortable Vitals:   10/16/18 2000 10/16/18 2015 10/16/18 2030 10/16/18 2045  BP: 124/70 125/71 123/72 115/69  Pulse: 96 96 93 94  Resp: (!) 24 (!) 26 18 17   Temp:      TempSrc:      SpO2: 100% 99% 100% 99%  Weight:      Height:       Eyes: PERRL, lids and conjunctivae normal ENMT: Mucous membranes are moist. Posterior pharynx clear of any exudate or lesions. Neck: normal, supple, no masses, no thyromegaly Respiratory: clear to  auscultation bilaterally, no wheezing, no crackles. Normal respiratory effort. No accessory muscle use.  Cardiovascular: Regular rate and rhythm, no murmurs / rubs / gallops. No extremity edema. 2+ pedal pulses.  Port a cath right upper chest area clean without Erythema or tenderness Abdomen: Ileostomy present amount of brown watery stool present, no  tenderness, no masses palpated. No hepatosplenomegaly. Bowel sounds positive.  Musculoskeletal: no clubbing / cyanosis. No joint deformity upper and lower extremities. Good ROM, no contractures.  Skin: no rashes, lesions, ulcers. No induration Neurologic: CN 2-12 grossly intact. Strength 5/5 in all 4.  Psychiatric: Normal judgment and insight. Alert and oriented x 3. Normal mood.   Labs on Admission: I have personally reviewed following labs and imaging studies  CBC: Recent Labs  Lab 10/16/18 1828  WBC 3.6*  NEUTROABS 1.6*  HGB 11.2*  HCT 33.4*  MCV 94.1  PLT 355   Basic Metabolic Panel: Recent Labs  Lab 10/16/18 1828  NA 132*  K 4.1  CL 97*  CO2 24  GLUCOSE 217*  BUN 15  CREATININE 0.84  CALCIUM 8.3*  MG 1.8   Liver Function Tests: Recent Labs  Lab 10/16/18 1828  AST 20  ALT 19  ALKPHOS 79  BILITOT 1.0  PROT 6.1*  ALBUMIN 2.8*   Recent Labs  Lab 10/16/18 1828  LIPASE 24   Coagulation Profile: Recent Labs  Lab 10/16/18 1828  INR 1.4*   Cardiac Enzymes: Recent Labs  Lab 10/16/18 1828  TROPONINI <0.03   Urine analysis:    Component Value Date/Time   COLORURINE AMBER (A) 10/16/2018 1920   APPEARANCEUR HAZY (A) 10/16/2018 1920   LABSPEC 1.025 10/16/2018 1920   PHURINE 5.0 10/16/2018 1920   GLUCOSEU NEGATIVE 10/16/2018 1920   HGBUR NEGATIVE 10/16/2018 1920   BILIRUBINUR NEGATIVE 10/16/2018 1920   KETONESUR NEGATIVE 10/16/2018 1920   PROTEINUR 30 (A) 10/16/2018 1920   UROBILINOGEN 1.0 11/14/2014 1504   NITRITE NEGATIVE 10/16/2018 1920   LEUKOCYTESUR NEGATIVE 10/16/2018 1920    Radiological Exams  on Admission: Dg Chest 2 View  Result Date: 10/16/2018 CLINICAL DATA:  Suspected sepsis. Patient started on a new chemotherapy drive. EXAM: CHEST - 2 VIEW COMPARISON:  09/25/2018 FINDINGS: Cardiac silhouette is normal in size. No mediastinal or hilar masses or evidence of adenopathy. Pulmonary anastomosis staples with hazy associated opacity is noted at the right lung base, stable from prior studies. There are right lung nodules. Lungs otherwise clear. No pleural effusion or pneumothorax. Right anterior chest wall Port-A-Cath is stable. Skeletal structures are intact. IMPRESSION: No acute cardiopulmonary disease. Electronically Signed   By: Lajean Manes M.D.   On: 10/16/2018 18:53    EKG: Independently reviewed.  Sinus rhythm.  Rate 94.  QTc 441.  No significant change when compared to prior EKG.  Assessment/Plan Active Problems:   SIRS (systemic inflammatory response syndrome) (HCC)    SIRS- with tachypnea, leukopenia 3.6, initial tachycardia 108 that quickly resolved.  With normal lactic acid 1.9 >> 1.7.  Reported fevers 100.5 at home.  Abdominal exam benign, reported vomiting, mild change in bowel habits but he has been on liquid diet due to poor appetite.  Patient is immunocompromised.  At this time presentation is not convincing for infectious etiology.  UA and chest x-ray unremarkable. -Considering immunocompromised status, recent hospitalization, will continue IV antibiotics vancomycin cefepime and metronidazole -Follow-up blood and urine cultures - CBC, BMP a.m  Ileostomy status- at baseline his stools are watery, but even now more so considering liquid diet over the past few days secondary to poor appetite.  He has a history of high output ileostomy and chronic diarrhea. 1L bolus given in ED - IVF N/s 100cc/hr - BMP a.m - Check Mag-  1.8 -Clear liquid diet for now, advance as tolerated  Stage IV rectal carcinoma with lung and pancreatic  metastasis- poor p.o. intake likely secondary  to chemotherapy.  Follows with Dr. [name].  LAst visit 10/04/18- Chemotherapy changed to FOLFIRI and bevacizumab, due to progression of disease.  Marinol was also started for low appetite -Pending med reconciliation resume home Marinol -Zofran PRN  DM2- random glucose 217. - Pending med reconciliation resume home glimepiride - SSI  DVT and PE- on anticoagulation with Xarelto.  Compliant.  Hgb stable. -Continue home Xarelto.  Anxiety-continue home clonazepam 0.5 mg twice daily as needed.  DVT prophylaxis: Xarelto Code Status: Full Family Communication: None at bedside Disposition Plan: Per rounding team Consults called: None Admission status: Obs, tele   Bethena Roys MD [facility]  10/16/2018, 9:13 PM

## 2018-10-16 NOTE — Progress Notes (Signed)
Pharmacy Antibiotic Note  Charles [name] is a 68 y.o. male admitted on 10/16/2018 with infection of unknown origin.  Pharmacy has been consulted for vancomycin and cefepime dosing.  Plan: Vancomycin 750mg  IV every 8 hours.  Goal trough 15-20 mcg/mL. cefepime 2gm iv q8h  Height: 6\' 2"  (188 cm) Weight: 150 lb (68 kg) IBW/kg (Calculated) : 82.2  Temp (24hrs), Avg:99.2 F (37.3 C), Min:99.2 F (37.3 C), Max:99.2 F (37.3 C)  Recent Labs  Lab 10/16/18 1828 10/16/18 1829  WBC 3.6*  --   CREATININE 0.84  --   LATICACIDVEN  --  1.9    Estimated Creatinine Clearance: 81 mL/min (by C-G formula based on SCr of 0.84 mg/dL).    Allergies  Allergen Reactions  . Oxycodone     Blisters, hallucinations  . Tramadol     Blisters, hallucinations  . Trazodone And Nefazodone Other (See Comments)    hallucinations    Antimicrobials this admission: 6/13 vancomycin >> 6/13 cefepime >> 6/13 metronidazole x 1   Microbiology results: 6/13 BCx sent  6/13 MRSA PCR sent  6/13 Covid 19 sent   Thank you for allowing pharmacy to be a part of this patient's care.  Charles [name] Charles [name] 10/16/2018 7:15 PM

## 2018-10-16 NOTE — ED Notes (Signed)
Pt is refusing to have COVID swab performed at this time. Explained to pt that we have to test all patients that are being admitted. Pt still refuses at this time. EDP aware.

## 2018-10-16 NOTE — ED Provider Notes (Signed)
[facility] EMERGENCY DEPARTMENT Provider Note   CSN: [id number] Arrival date & time: 10/16/18  1753     History   Chief Complaint No chief complaint on file.   HPI Charles [name] is a 68 y.o. male.     HPI  Pt was seen at 1820. Per pt, c/o gradual onset and persistence of multiple intermittent episodes of N/V/D for the past 1 week.   Describes the stools as "watery." Has been associated with home fevers to "100.5" for the past 2 days. Last fever was approximately 1 hour PTA, and he took APAP.  Pt states his LD chemo was last week. Pt states he has been taking compazine, zofran, imodium without improvement of his symptoms. Pt states he has been unable to tol PO due to N/V. Denies cough, no abd pain, no CP/SOB, no back pain, no rash, no black or blood in stools or emesis.    Past Medical History:  Diagnosis Date  . Chronic anticoagulation   . Colon cancer ([place]) 07/24/2010   rectal ca, inv adenocarcinoma  . Depression 04/01/2011  . Diabetes mellitus without complication ([place])   . DVT (deep venous thrombosis) ([place]) 05/09/2011  . GERD (gastroesophageal reflux disease)   . High output ileostomy ([place]) 05/21/2011  . History of kidney stones   . HTN (hypertension)   . Hx of radiation therapy 09/02/10 to 10/14/10   pelvis  . Lung metastasis ([place])   . Neuropathy   . Peripheral vascular disease ([place])    dvt's,pe  . Pneumonia    hx x3  . Pulmonary embolism ([place])   . Rectal cancer ([place]) 01/15/2011   S/P radiation and concurrent 5-FU continuous infusion from 09/09/10- 10/10/10.  S/P proctectomy with colorectal anastomosis and diverting loop ileostomy on 11/14/10 at [facility] by Dr. Harlon Ditty. Pathology reveals a pT3b N1 with 3/20 lymph nodes.      Patient Active Problem List   Diagnosis Date Noted  . Metastatic colorectal cancer ([place])   . Anastomotic stricture of colorectal region   . Abnormal CT scan, colon   . Diarrhea in adult patient 09/22/2018  . History of pulmonary embolism  09/22/2018  . Hypokalemia 09/22/2018  . Type 2 diabetes mellitus ([place]) 09/22/2018  . Hypomagnesemia 09/22/2018  . Dilated gallbladder 09/22/2018  . Goals of care, counseling/discussion 09/20/2018  . Fecal incontinence 09/23/2017  . Community acquired pneumonia of right lower lobe of lung ([place]) 07/23/2016  . Abnormal pancreas function test   . Rectal pain 05/30/2016  . Hiatal hernia   . Schatzki's ring   . GI bleed 11/23/2014  . Gastritis 11/23/2014  . S/P thoracotomy 11/23/2014  . S/P partial lobectomy of lung 11/15/2014  . DVT (deep venous thrombosis) ([place])   . Lung nodule   . Lung nodule, solitary   . Acute pulmonary embolism ([place]) 09/08/2014  . Lactic acidosis 09/08/2014  . Lower abdominal pain 09/08/2014  . Urinary retention 09/08/2014  . Diarrhea 08/01/2014  . 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 Stiffness of joint, not elsewhere classified, ankle and foot 01/31/2014  . Weakness of both legs 01/31/2014  . Hx of radiation therapy   . Rectal cancer ([place]) 01/15/2011  . Colon cancer ([place]) 07/24/2010  . GERD 07/11/2010    Past Surgical History:  Procedure Laterality Date  . BILIARY STENT PLACEMENT N/A 09/16/2018   Procedure: STENT PLACEMENT;  Surgeon: Rogene Houston, MD;  Location[facility];  Service: Endoscopy;  Laterality: N/A;  . BIOPSY  09/24/2018   Procedure: BIOPSY;  Surgeon: Danie Binder, MD;  Location[facility];  Service: Endoscopy;;  . COLON SURGERY  11/14/2010   proctectomy with colorectal anastomosis and diverting loop ileostomy (temporary planned)  . COLONOSCOPY  07/2010   proximal rectal apple core mass 10-14cm from anal verge (adenocarcinoma), 2-3cm distal rectal carpet polyp s/p piecemeal snare polypectomy (adenoma)  . COLONOSCOPY  04/21/2012   RMR: Friable,fibrotic appearing colorectal anastomosis producing some luminal narrowing-not felt to be critical. path: focal erosion with slight inflammation and hyperemia.  SURVEILLANCE DUE DEC 2015  . COLONOSCOPY N/A 11/24/2013   Dr. Rourk:somewhat fibrotic/friable anastomotic mucosal-status post biopsy (narrowing not felt to be clinically significant) Single colonic diverticulum. benign polypoid rectal mucosa  . colostomy reversal  april 2013  . ERCP N/A 09/16/2018   Procedure: ENDOSCOPIC RETROGRADE CHOLANGIOPANCREATOGRAPHY (ERCP);  Surgeon: Rogene Houston, MD;  Location: [facility];  Service: Endoscopy;  Laterality: N/A;  . ESOPHAGOGASTRODUODENOSCOPY  07/2010   RMR: schatki ring s/p dilation, small hh, SB bx benign  . ESOPHAGOGASTRODUODENOSCOPY N/A 09/04/2015   Procedure: ESOPHAGOGASTRODUODENOSCOPY (EGD);  Surgeon: Daneil Dolin, MD;  Location: [facility];  Service: Endoscopy;  Laterality: N/A;  730  . EUS  08/2010   Dr. Owens Loffler. uT3N0 circumferential, nearly obstruction rectosigmoid adenocarcinoma, distal edge 12cm from anal verge  . EUS N/A 06/19/2016   Procedure: UPPER ENDOSCOPIC ULTRASOUND (EUS) RADIAL;  Surgeon: Milus Banister, MD;  Location: [facility] END[facility];  Service: Endoscopy;  Laterality: N/A;  . FLEXIBLE SIGMOIDOSCOPY N/A 09/24/2018   Procedure: FLEXIBLE SIGMOIDOSCOPY;  Surgeon: Danie Binder, MD;  Location[facility];  Service: Endoscopy;  Laterality: N/A;  . HERNIA REPAIR     abd hernia repair  . IVC filter    . ivc filter    . port a cath placement    . PORT-A-CATH REMOVAL  09/24/2011   Procedure: REMOVAL PORT-A-CATH;  Surgeon: Donato Heinz, MD;  Location: [facility] ORS;  Service: General;  Laterality: N/A;  Minor Room  . PORTACATH PLACEMENT Right 07/07/2016   Procedure: INSERTION PORT-A-CATH;  Surgeon: Aviva Signs, MD;  Location: [facility] ORS;  Service: General;  Laterality: Right;  . SPHINCTEROTOMY N/A 09/16/2018   Procedure: SPHINCTEROTOMY with balloon dialation;  Surgeon: Rogene Houston, MD;  Location[facility];  Service: Endoscopy;  Laterality: N/A;  . TRANSVERSE LOOP COLOSTOMY N/A 09/29/2018   Procedure: TRANSVERSE LOOP COLOSTOMY;   Surgeon: Aviva Signs, MD;  Location: [facility] ORS;  Service: General;  Laterality: N/A;  . VIDEO ASSISTED THORACOSCOPY (VATS)/WEDGE RESECTION Right 11/15/2014   Procedure: VIDEO ASSISTED THORACOSCOPY (VATS)/LUNG RESECTION WITH RIGHT LINGULECTOMY;  Surgeon: [name], MD;  Location: [place];  Service: Thoracic;  Laterality: Right;  Marland Kitchen VIDEO BRONCHOSCOPY N/A 11/15/2014   Procedure: VIDEO BRONCHOSCOPY;  Surgeon: [name], MD;  Location: [facility] OR;  Service: Thoracic;  Laterality: N/A;        Home Medications    Prior to Admission medications   Medication Sig Start Date End Date Taking? Authorizing Provider  acetaminophen (TYLENOL) 500 MG tablet Take 1,000 mg by mouth every 6 (six) hours as needed for mild pain or moderate pain.     [provider]  clonazePAM (KLONOPIN) 0.5 MG tablet Take 1 tablet (0.5 mg total) by mouth 2 (two) times daily as needed. 01/26/18   Lockamy, Randi L, NP-C  Diphenhyd-Hydrocort-Nystatin (FIRST-DUKES MOUTHWASH) SUSP Use as directed 5 mLs in the mouth or throat 4 (four) times daily. 10/07/18  Derek Jack, MD  diphenoxylate-atropine (LOMOTIL) 2.5-0.025 MG tablet Take 1 tablet by mouth 4 (four) times daily as needed for diarrhea or loose stools. 08/10/18   Derek Jack, MD  dronabinol (MARINOL) 2.5 MG capsule Take 1 capsule (2.5 mg total) by mouth 2 (two) times daily before a meal. 10/04/18   Derek Jack, MD  gabapentin (NEURONTIN) 300 MG capsule Take 3 capsules (900 mg total) by mouth 2 (two) times daily. 07/23/18   Derek Jack, MD  glimepiride (AMARYL) 2 MG tablet Take 2 mg by mouth daily with breakfast.     [provider]  mirtazapine (REMERON) 7.5 MG tablet Take 1 tablet (7.5 mg total) by mouth at bedtime. 06/22/18   Lockamy, Randi L, NP-C  omeprazole (PRILOSEC) 20 MG capsule Take 20 mg by mouth 2 (two) times daily before a meal.     [provider]  ondansetron (ZOFRAN) 8 MG tablet Take 1 tablet (8 mg total)  by mouth 2 (two) times daily as needed for refractory nausea / vomiting. Start on day 3 after chemotherapy. 10/01/18   Roxan Hockey, MD  oxyCODONE-acetaminophen (PERCOCET) 10-325 MG tablet Take 1 tablet by mouth every 6 (six) hours as needed for pain. 10/01/18   Roxan Hockey, MD  Probiotic Product (PROBIOTIC DAILY PO) Take 1 capsule by mouth daily.    [provider]  prochlorperazine (COMPAZINE) 10 MG tablet Take 1 tablet (10 mg total) by mouth every 6 (six) hours as needed for nausea or vomiting. 10/06/18   Derek Jack, MD  rivaroxaban (XARELTO) 20 MG TABS tablet Take 1 tablet (20 mg total) by mouth daily with supper. 09/19/18   Rogene Houston, MD  traZODone (DESYREL) 100 MG tablet Take 1 tablet (100 mg total) by mouth at bedtime. 09/20/18   Derek Jack, MD  vitamin B-12 (CYANOCOBALAMIN) 1000 MCG tablet Take 1,000 mcg by mouth daily.    [provider]    Family History Family History  Problem Relation Age of Onset  . Cancer Brother        throat  . Cancer Brother        prostate  . Colon cancer Neg Hx   . Liver disease Neg Hx   . Inflammatory bowel disease Neg Hx     Social History Social History   Tobacco Use  . Smoking status: Never Smoker  . Smokeless tobacco: Never Used  Substance Use Topics  . Alcohol use: No  . Drug use: No     Allergies   Oxycodone, Tramadol, and Trazodone and nefazodone   Review of Systems Review of Systems ROS: Statement: All systems negative except as marked or noted in the HPI; Constitutional: Negative for fever and chills. ; ; Eyes: Negative for eye pain, redness and discharge. ; ; ENMT: Negative for ear pain, hoarseness, nasal congestion, sinus pressure and sore throat. ; ; Cardiovascular: Negative for chest pain, palpitations, diaphoresis, dyspnea and peripheral edema. ; ; Respiratory: Negative for cough, wheezing and stridor. ; ; Gastrointestinal: +N/V/D. Negative for abdominal pain, blood in stool,  hematemesis, jaundice and rectal bleeding. . ; ; Genitourinary: Negative for dysuria, flank pain and hematuria. ; ; Musculoskeletal: Negative for back pain and neck pain. Negative for swelling and trauma.; ; Skin: Negative for pruritus, rash, abrasions, blisters, bruising and skin lesion.; ; Neuro: Negative for headache, lightheadedness and neck stiffness. Negative for weakness, altered level of consciousness, altered mental status, extremity weakness, paresthesias, involuntary movement, seizure and syncope.       Physical  Exam Updated Vital Signs BP 116/66   Pulse 98   Temp 99.2 F (37.3 C) (Oral)   Resp (!) 26   Ht 6\' 2"  (1.88 m)   Wt 68 kg   SpO2 100%   BMI 19.26 kg/m    Patient Vitals for the past 24 hrs:  BP Temp Temp src Pulse Resp SpO2 Height Weight  10/16/18 2045 115/69 - - 94 17 99 % - -  10/16/18 2030 123/72 - - 93 18 100 % - -  10/16/18 2015 125/71 - - 96 (!) 26 99 % - -  10/16/18 2000 124/70 - - 96 (!) 24 100 % - -  10/16/18 1945 123/71 - - 97 (!) 24 100 % - -  10/16/18 1930 118/78 - - 98 (!) 30 99 % - -  10/16/18 1915 116/66 - - 98 - 100 % - -  10/16/18 1900 119/65 - - 99 - 99 % - -  10/16/18 1845 117/64 - - 97 (!) 26 98 % - -  10/16/18 1830 130/66 - - 98 (!) 26 98 % - -  10/16/18 1802 - - - - - - 6\' 2"  (1.88 m) 68 kg  10/16/18 1801 126/77 99.2 F (37.3 C) Oral (!) 108 (!) 24 98 % - -     20:10 Orthostatic Vital Signs MW  Orthostatic Lying   BP- Lying: 118/68  Pulse- Lying: 94      Orthostatic Sitting  BP- Sitting: 120/70  Pulse- Sitting: 98      Orthostatic Standing at 0 minutes  BP- Standing at 0 minutes: 108/70  Pulse- Standing at 0 minutes: 104      Physical Exam 1825: Physical examination:  Nursing notes reviewed; Vital signs and O2 SAT reviewed;  Constitutional: Well developed, Well nourished, In no acute distress; Head:  Normocephalic, atraumatic; Eyes: EOMI, PERRL, No scleral icterus; ENMT: Mouth and pharynx normal, Mucous membranes dry;  Neck: Supple, Full range of motion, No lymphadenopathy; Cardiovascular: Regular rate and rhythm, No gallop; Respiratory: Breath sounds clear & equal bilaterally, No wheezes.  Speaking full sentences with ease, Normal respiratory effort/excursion; Chest: Nontender, Movement normal; Abdomen: Soft, Nontender, Nondistended, Normal bowel sounds; Genitourinary: No CVA tenderness; Extremities: Peripheral pulses normal, No tenderness, No edema, No calf edema or asymmetry.; Neuro: AA&Ox3, Major CN grossly intact.  Speech clear. No gross focal motor or sensory deficits in extremities.; Skin: Color normal, Warm, Dry.   ED Treatments / Results  Labs (all labs ordered are listed, but only abnormal results are displayed)   EKG EKG Interpretation  Date/Time:  Saturday October 16 2018 19:27:33 EDT Ventricular Rate:  94 PR Interval:    QRS Duration: 98 QT Interval:  352 QTC Calculation: 441 R Axis:   69 Text Interpretation:  Sinus rhythm Low voltage, precordial leads When compared with ECG of 09/22/2018 Rate slower Otherwise no significant change Confirmed by Francine Graven [phone]) on 10/16/2018 7:35:10 PM   Radiology   Procedures Procedures (including critical care time)  Medications Ordered in ED Medications  sodium chloride flush (NS) 0.9 % injection 3 mL (has no administration in time range)  0.9 %  sodium chloride infusion (1,000 mLs Intravenous New Bag/Given 10/16/18 1917)  metroNIDAZOLE (FLAGYL) IVPB 500 mg (has no administration in time range)  vancomycin (VANCOCIN) IVPB 1000 mg/200 mL premix (has no administration in time range)  vancomycin (VANCOCIN) IVPB 750 mg/150 ml premix (has no administration in time range)  ceFEPIme (MAXIPIME) 2 g in sodium chloride 0.9 % 100  mL IVPB (has no administration in time range)  ceFEPIme (MAXIPIME) 2 g in sodium chloride 0.9 % 100 mL IVPB (0 g Intravenous Stopped 10/16/18 1956)     Initial Impression / Assessment and Plan / ED Course  I have reviewed the  triage vital signs and the nursing notes.  Pertinent labs & imaging results that were available during my care of the patient were reviewed by me and considered in my medical decision making (see chart for details).       MDM Reviewed: previous chart, nursing note and vitals Reviewed previous: labs and ECG Interpretation: labs, ECG and x-ray Total time providing critical care: 30-74 minutes. This excludes time spent performing separately reportable procedures and services. Consults: admitting MD   CRITICAL CARE Performed by: Francine Graven Total critical care time: 35 minutes Critical care time was exclusive of separately billable procedures and treating other patients. Critical care was necessary to treat or prevent imminent or life-threatening deterioration. Critical care was time spent personally by me on the following activities: development of treatment plan with patient and/or surrogate as well as nursing, discussions with consultants, evaluation of patient's response to treatment, examination of patient, obtaining history from patient or surrogate, ordering and performing treatments and interventions, ordering and review of laboratory studies, ordering and review of radiographic studies, pulse oximetry and re-evaluation of patient's condition.   Results for orders placed or performed during the hospital encounter of 10/16/18  Culture, blood (Routine x 2)   Specimen: Porta Cath; Blood  Result Value Ref Range   Specimen Description PORTA CATH    Special Requests      BOTTLES DRAWN AEROBIC AND ANAEROBIC Blood Culture adequate volume Performed at [facility], [address] [place], [place] [address]    Culture PENDING    Report Status PENDING   Culture, blood (Routine x 2)   Specimen: Vein; Blood  Result Value Ref Range   Specimen Description RIGHT ANTECUBITAL    Special Requests      BOTTLES DRAWN AEROBIC AND ANAEROBIC Blood Culture adequate volume Performed at [facility], [address], [place], [place] [address]    Culture PENDING    Report Status PENDING   Comprehensive metabolic panel  Result Value Ref Range   Sodium 132 (L) 135 - 145 mmol/L   Potassium 4.1 3.5 - 5.1 mmol/L   Chloride 97 (L) 98 - 111 mmol/L   CO2 24 22 - 32 mmol/L   Glucose, Bld 217 (H) 70 - 99 mg/dL   BUN 15 8 - 23 mg/dL   Creatinine, Ser 0.84 0.61 - 1.24 mg/dL   Calcium 8.3 (L) 8.9 - 10.3 mg/dL   Total Protein 6.1 (L) 6.5 - 8.1 g/dL   Albumin 2.8 (L) 3.5 - 5.0 g/dL   AST 20 15 - 41 U/L   ALT 19 0 - 44 U/L   Alkaline Phosphatase 79 38 - 126 U/L   Total Bilirubin 1.0 0.3 - 1.2 mg/dL   GFR calc non Af Amer >60 >60 mL/min   GFR calc Af Amer >60 >60 mL/min   Anion gap 11 5 - 15  Lactic acid, plasma  Result Value Ref Range   Lactic Acid, Venous 1.9 0.5 - 1.9 mmol/L  CBC with Differential  Result Value Ref Range   WBC 3.6 (L) 4.0 - 10.5 K/uL   RBC 3.55 (L) 4.22 - 5.81 MIL/uL   Hemoglobin 11.2 (L) 13.0 - 17.0 g/dL   HCT 33.4 (L) 39.0 - 52.0 %  MCV 94.1 80.0 - 100.0 fL   MCH 31.5 26.0 - 34.0 pg   MCHC 33.5 30.0 - 36.0 g/dL   RDW 13.6 11.5 - 15.5 %   Platelets 202 150 - 400 K/uL   nRBC 0.0 0.0 - 0.2 %   Neutrophils Relative % 44 %   Neutro Abs 1.6 (L) 1.7 - 7.7 K/uL   Lymphocytes Relative 32 %   Lymphs Abs 1.1 0.7 - 4.0 K/uL   Monocytes Relative 16 %   Monocytes Absolute 0.6 0.1 - 1.0 K/uL   Eosinophils Relative 7 %   Eosinophils Absolute 0.3 0.0 - 0.5 K/uL   Basophils Relative 1 %   Basophils Absolute 0.0 0.0 - 0.1 K/uL   Immature Granulocytes 0 %   Abs Immature Granulocytes 0.01 0.00 - 0.07 K/uL  Protime-INR  Result Value Ref Range   Prothrombin Time 17.3 (H) 11.4 - 15.2 seconds   INR 1.4 (H) 0.8 - 1.2  Urinalysis, Routine w reflex microscopic  Result Value Ref Range   Color, Urine AMBER (A) YELLOW   APPearance HAZY (A) CLEAR   Specific Gravity, Urine 1.025 1.005 - 1.030   pH 5.0 5.0 - 8.0   Glucose, UA NEGATIVE NEGATIVE mg/dL   Hgb urine dipstick  NEGATIVE NEGATIVE   Bilirubin Urine NEGATIVE NEGATIVE   Ketones, ur NEGATIVE NEGATIVE mg/dL   Protein, ur 30 (A) NEGATIVE mg/dL   Nitrite NEGATIVE NEGATIVE   Leukocytes,Ua NEGATIVE NEGATIVE   RBC / HPF 0-5 0 - 5 RBC/hpf   WBC, UA 0-5 0 - 5 WBC/hpf   Bacteria, UA NONE SEEN NONE SEEN   Mucus PRESENT   Lipase, blood  Result Value Ref Range   Lipase 24 11 - 51 U/L  Troponin I - Once  Result Value Ref Range   Troponin I <0.03 <0.03 ng/mL   Dg Chest 2 View Result Date: 10/16/2018 CLINICAL DATA:  Suspected sepsis. Patient started on a new chemotherapy drive. EXAM: CHEST - 2 VIEW COMPARISON:  09/25/2018 FINDINGS: Cardiac silhouette is normal in size. No mediastinal or hilar masses or evidence of adenopathy. Pulmonary anastomosis staples with hazy associated opacity is noted at the right lung base, stable from prior studies. There are right lung nodules. Lungs otherwise clear. No pleural effusion or pneumothorax. Right anterior chest wall Port-A-Cath is stable. Skeletal structures are intact. IMPRESSION: No acute cardiopulmonary disease. Electronically Signed   By: Lajean Manes M.D.   On: 10/16/2018 18:53    JDEN WANT was evaluated in Emergency Department on 10/16/2018 for the symptoms described in the history of present illness. He was evaluated in the context of the global COVID-19 pandemic, which necessitated consideration that the patient might be at risk for infection with the SARS-CoV-2 virus that causes COVID-19. Institutional protocols and algorithms that pertain to the evaluation of patients at risk for COVID-19 are in a state of rapid change based on information released by regulatory bodies including the CDC and federal and state organizations. These policies and algorithms were followed during the patient's care in the ED.    2035:  Code Sepsis called on pt's arrival. BP remains stable. BC and UC obtained, IV abx started. Pt appears clinically dehydrated and is orthostatic on VS;  judicious IVF given. Dx and testing d/w pt.  Questions answered.  Verb understanding, agreeable to admit.  T/C returned from [facility]. [name], case discussed, including:  HPI, pertinent PM/SHx, VS/PE, dx testing, ED course and treatment:  Agreeable to admit.  Final Clinical Impressions(s) / ED Diagnoses   Final diagnoses:  None    ED Discharge Orders    None       Francine Graven, DO 10/21/18 8938

## 2018-10-17 DIAGNOSIS — R6510 Systemic inflammatory response syndrome (SIRS) of non-infectious origin without acute organ dysfunction: Secondary | ICD-10-CM | POA: Diagnosis not present

## 2018-10-17 DIAGNOSIS — R112 Nausea with vomiting, unspecified: Secondary | ICD-10-CM

## 2018-10-17 DIAGNOSIS — E860 Dehydration: Secondary | ICD-10-CM | POA: Diagnosis not present

## 2018-10-17 DIAGNOSIS — C20 Malignant neoplasm of rectum: Secondary | ICD-10-CM

## 2018-10-17 DIAGNOSIS — R197 Diarrhea, unspecified: Secondary | ICD-10-CM

## 2018-10-17 LAB — GLUCOSE, CAPILLARY
Glucose-Capillary: 95 mg/dL (ref 70–99)
Glucose-Capillary: 139 mg/dL — ABNORMAL HIGH (ref 70–99)

## 2018-10-17 LAB — CBC
HCT: 32.6 % — ABNORMAL LOW (ref 39.0–52.0)
Hemoglobin: 10.5 g/dL — ABNORMAL LOW (ref 13.0–17.0)
MCH: 31.1 pg (ref 26.0–34.0)
MCHC: 32.2 g/dL (ref 30.0–36.0)
MCV: 96.4 fL (ref 80.0–100.0)
Platelets: 191 10*3/uL (ref 150–400)
RBC: 3.38 MIL/uL — ABNORMAL LOW (ref 4.22–5.81)
RDW: 13.6 % (ref 11.5–15.5)
WBC: 2.8 10*3/uL — ABNORMAL LOW (ref 4.0–10.5)
nRBC: 0 % (ref 0.0–0.2)

## 2018-10-17 LAB — BASIC METABOLIC PANEL
Anion gap: 8 (ref 5–15)
BUN: 11 mg/dL (ref 8–23)
CO2: 26 mmol/L (ref 22–32)
Calcium: 8.1 mg/dL — ABNORMAL LOW (ref 8.9–10.3)
Chloride: 103 mmol/L (ref 98–111)
Creatinine, Ser: 0.74 mg/dL (ref 0.61–1.24)
GFR calc Af Amer: >60 mL/min (ref >60)
GFR calc non Af Amer: >60 mL/min (ref >60)
Glucose, Bld: 97 mg/dL (ref 70–99)
Potassium: 4.1 mmol/L (ref 3.5–5.1)
Sodium: 137 mmol/L (ref 135–145)

## 2018-10-17 LAB — MRSA PCR SCREENING: MRSA by PCR: NEGATIVE

## 2018-10-17 MED ORDER — PROMETHAZINE HCL 25 MG PO TABS: 25.0000 mg | ORAL_TABLET | Freq: Three times a day (TID) | ORAL | 0 refills | Status: DC | PRN

## 2018-10-17 MED ORDER — GLUCERNA SHAKE PO LIQD: 237.0000 mL | Freq: Two times a day (BID) | ORAL | Status: DC

## 2018-10-17 MED ORDER — HEPARIN SOD (PORK) LOCK FLUSH 100 UNIT/ML IV SOLN
500.0000 [IU] | INTRAVENOUS | Status: AC | PRN
  Administered 2018-10-17: 500 [IU]
  Filled 2018-10-17: qty 5

## 2018-10-17 NOTE — Discharge Summary (Signed)
Physician Discharge Summary  Charles [name]:[id number] DOB: [birth year] DOA: 10/16/2018  PCP: [name], MD  Admit date: 10/16/2018 Discharge date: 10/17/2018  Time spent: 30 minutes  Recommendations for Outpatient Follow-up:  1. Repeat CBC to follow blood count stability (especially leukopenia) 2. Repeat base metabolic panel to follow electrolytes and renal function.   Discharge Diagnoses:  Active Problems:   Dehydration   SIRS (systemic inflammatory response syndrome) (HCC)   Nausea, vomiting and diarrhea History of DVT/PE on chronic anticoagulation History of anxiety History of type 2 diabetes mellitus Stage IV rectal carcinoma with metastases to pancreas lungs.  Discharge Condition: Stable and improved.  Patient discharged home with instructions to follow-up with PCP in 2 weeks and to continue appointment with oncology service as previously scheduled.  Diet recommendation: Soft diet  Filed Weights   10/16/18 1802  Weight: 68 kg    History of present illness:  As per H&P written by Dr. [name] on 10/16/2018. 68 y.o. male with medical history significant for stage IV rectal cancer with lung and pancreatic metastasis, high output ileostomy, DM2, DVT and PE on anticoagulation, who presented to the ED with complaints of watery stools for 1 week, and temperatures of 100.5 consistently over the past 2 days, for he has been taking Tylenol but he did not take Tylenol today. On my evaluation patient tells me he has had a few episodes of vomiting over the past week, dry heaving yesterday, he has not been able to eat anything due to poor appetite and has been drinking mostly liquids without any solid foods.  At baseline his stools from his colostomy are watery but have been more watery over the past few days.  Reports 2 watery stools per day for the past few days. He came to the ED because he was worried he was dehydrated. He denies abdominal pain, no difficulty breathing or  cough.  No contact with sick persons.  No neck pain or stiffness.  No chest pain.  No pain with urination.  Hospital Course:  1-SIRS -No signs of infection appreciated -Patient symptoms improve with fluid resuscitation and supportive care -Sirs criteria essentially resolved at time of discharge -Patient no longer experiencing any nausea or vomiting. -Advised to maintain adequate hydration -Continue outpatient follow-up with oncology service -Patient presentation most likely secondary to side effect from chemotherapy.  2-ileostomy status -Patient advised to maintain adequate hydration and to wean from nutrition to his best possible ability. -Discharge with prescription for Phenergan for refractory symptoms -Continue the use of Zofran and Compazine as needed. -Continue outpatient follow up with general surgery as needed.  3-stage IV rectal cancer with lung and pancreatic metastasis -Continue outpatient follow-up with oncology service -Patient actively receiving chemotherapy. -Continue Marinol  4-type 2 diabetes mellitus -Resume home hypoglycemic agents -Patient advised to maintain adequate hydration.  5-history of DVT/PE -Continue Xarelto. -No signs of overt bleeding -A stable hemoglobin level.  6-history of anxiety -Stable mood currently -Continue as needed Klonopin.  7-leukopenia -Most likely side effect from chemotherapeutic agents -Continue following WBCs level at follow-up visit.   Procedures:  See below for x-ray reports.  Consultations:  None  Discharge Exam: Vitals:   10/16/18 2225 10/17/18 0511  BP: 119/71 110/69  Pulse: 86 90  Resp: 17 16  Temp: 98.2 F (36.8 C) 98.1 F (36.7 C)  SpO2: 100% 99%    General: Afebrile, no further episode of nausea vomiting.  Reports no abdominal pain. Cardiovascular: S1 and S2, no rubs, no gallops  Respiratory: Clear to auscultation bilaterally; normal respiratory effort.  Not needing oxygen  supplementation. Abdomen: Soft, nontender, nondistended, positive bowel sounds.  Ileostomy bag in place; no signs of superimposed infection appreciated. Extremities: No edema, no cyanosis, no clubbing.  Discharge Instructions   Discharge Instructions    Discharge instructions   Complete by: As directed    Take medications as prescribed Keep yourself well-hydrated Follow-up with oncology service as instructed Follow-up with PCP in 2 weeks.     Allergies as of 10/17/2018      Reactions   Oxycodone    Blisters, hallucinations   Tramadol    Blisters, hallucinations   Trazodone And Nefazodone Other (See Comments)   hallucinations      Medication List    TAKE these medications   acetaminophen 500 MG tablet Commonly known as: TYLENOL Take 1,000 mg by mouth every 6 (six) hours as needed for mild pain or moderate pain.   clonazePAM 0.5 MG tablet Commonly known as: KLONOPIN Take 1 tablet (0.5 mg total) by mouth 2 (two) times daily as needed.   diphenoxylate-atropine 2.5-0.025 MG tablet Commonly known as: LOMOTIL Take 1 tablet by mouth 4 (four) times daily as needed for diarrhea or loose stools.   dronabinol 2.5 MG capsule Commonly known as: MARINOL Take 1 capsule (2.5 mg total) by mouth 2 (two) times daily before a meal.   First-Dukes Mouthwash Susp Use as directed 5 mLs in the mouth or throat 4 (four) times daily.   gabapentin 300 MG capsule Commonly known as: NEURONTIN Take 3 capsules (900 mg total) by mouth 2 (two) times daily.   glimepiride 2 MG tablet Commonly known as: AMARYL Take 2 mg by mouth daily with breakfast.   mirtazapine 7.5 MG tablet Commonly known as: REMERON Take 1 tablet (7.5 mg total) by mouth at bedtime.   omeprazole 20 MG capsule Commonly known as: PRILOSEC Take 20 mg by mouth 2 (two) times daily before a meal.   ondansetron 8 MG tablet Commonly known as: Zofran Take 1 tablet (8 mg total) by mouth 2 (two) times daily as needed for  refractory nausea / vomiting. Start on day 3 after chemotherapy.   oxyCODONE-acetaminophen 10-325 MG tablet Commonly known as: Percocet Take 1 tablet by mouth every 6 (six) hours as needed for pain.   PROBIOTIC DAILY PO Take 1 capsule by mouth daily.   prochlorperazine 10 MG tablet Commonly known as: COMPAZINE Take 1 tablet (10 mg total) by mouth every 6 (six) hours as needed for nausea or vomiting.   promethazine 25 MG tablet Commonly known as: PHENERGAN Take 1 tablet (25 mg total) by mouth every 8 (eight) hours as needed for refractory nausea / vomiting.   rivaroxaban 20 MG Tabs tablet Commonly known as: XARELTO Take 1 tablet (20 mg total) by mouth daily with supper.   traZODone 100 MG tablet Commonly known as: DESYREL Take 1 tablet (100 mg total) by mouth at bedtime.   vitamin B-12 1000 MCG tablet Commonly known as: CYANOCOBALAMIN Take 1,000 mcg by mouth daily.      Allergies  Allergen Reactions  . Oxycodone     Blisters, hallucinations  . Tramadol     Blisters, hallucinations  . Trazodone And Nefazodone Other (See Comments)    hallucinations   Follow-up Information    Vyas, [name] B, MD. Schedule an appointment as soon as possible for a visit in 2 week(s).   Specialty: Internal Medicine Contact information: [address] [place] [address] [phone]  The results of significant diagnostics from this hospitalization (including imaging, microbiology, ancillary and laboratory) are listed below for reference.    Significant Diagnostic Studies: Dg Chest 2 View  Result Date: 10/16/2018 CLINICAL DATA:  Suspected sepsis. Patient started on a new chemotherapy drive. EXAM: CHEST - 2 VIEW COMPARISON:  09/25/2018 FINDINGS: Cardiac silhouette is normal in size. No mediastinal or hilar masses or evidence of adenopathy. Pulmonary anastomosis staples with hazy associated opacity is noted at the right lung base, stable from prior studies. There are right lung  nodules. Lungs otherwise clear. No pleural effusion or pneumothorax. Right anterior chest wall Port-A-Cath is stable. Skeletal structures are intact. IMPRESSION: No acute cardiopulmonary disease. Electronically Signed   By: Lajean Manes M.D.   On: 10/16/2018 18:53   Dg Chest 2 View  Result Date: 09/22/2018 CLINICAL DATA:  Diarrhea central chest pain EXAM: CHEST - 2 VIEW COMPARISON:  03/22/2018, CT chest 07/30/2018 FINDINGS: Right-sided central venous port tip over the SVC. Chronic elevation of right diaphragm with scarring at the right middle lobe. CT demonstrated right upper lobe lung nodules not well seen. Stable cardiomediastinal silhouette. No pneumothorax. IMPRESSION: No active cardiopulmonary disease. Stable right middle lobe scarring. Electronically Signed   By: Donavan Foil M.D.   On: 09/22/2018 16:59   Ct Abdomen Pelvis W Contrast  Result Date: 09/22/2018 CLINICAL DATA:  Stage IV rectal cancer with pancreatic metastases. Status post common bile duct stent placement. EXAM: CT ABDOMEN AND PELVIS WITH CONTRAST TECHNIQUE: Multidetector CT imaging of the abdomen and pelvis was performed using the standard protocol following bolus administration of intravenous contrast. CONTRAST:  128mL OMNIPAQUE IOHEXOL 300 MG/ML  SOLN COMPARISON:  MRI 09/10/2018.  CT scan 07/30/2018. FINDINGS: Lower chest: The heart size is normal. No substantial pericardial effusion. Coronary artery calcification is evident. Stable 7 mm anterior right lung nodule. Atelectasis noted lower lobes bilaterally. Hepatobiliary: No suspicious focal abnormality within the liver parenchyma. Gallbladder is distended with ill-defined gallbladder wall suggesting edema. Pneumobilia is compatible with the presence of the common bile duct stent. Pancreas: Diffuse pancreatic parenchymal atrophy with associated dilatation of the main pancreatic duct. Imaging features similar to prior with abrupt cut off at the level of the pancreatic head. 2.0 cm  hypoenhancing lesion in the posterior pancreatic head is similar to prior MRI when it was measured at 2.2 cm. Spleen: No splenomegaly. No focal mass lesion. Adrenals/Urinary Tract: Stable adrenal glands bilaterally without mass lesion. Kidneys unremarkable. No evidence for hydroureter. The urinary bladder appears normal for the degree of distention. Stomach/Bowel: Stomach is distended with fluid and food material. Duodenum is normally positioned as is the ligament of Treitz. No small bowel wall thickening. No small bowel dilatation. No small bowel wall thickening. No small bowel dilatation. The terminal ileum is normal. Appendicoliths noted in the tip of the appendix without periappendiceal edema or inflammation. Anastomotic staple line noted at the rectosigmoid junction with circumferential wall thickening in the rectum, progressed since 07/30/2018. Vascular/Lymphatic: There is abdominal aortic atherosclerosis without aneurysm. IVC filter identified in situ. Stranding in the hepato duodenal ligament may be related to edema. There is no gastrohepatic or hepatoduodenal ligament lymphadenopathy. No intraperitoneal or retroperitoneal lymphadenopathy. The 8 mm short axis celiac axis lymph node is stable and upper normal for size. Similar upper normal lymph nodes are seen in the portal caval space. No pelvic sidewall lymphadenopathy. Reproductive: The prostate gland and seminal vesicles are unremarkable. Other: No intraperitoneal free fluid. Musculoskeletal: Bilateral groin hernias contain only fat. No worrisome lytic  or sclerotic osseous abnormality. IMPRESSION: 1. Marked interval increase in circumferential wall thickening in the rectum, adjacent to the anastomosis. Recurrent disease a distinct concern. 2. Stomach is distended with fluid and food material. No associated small bowel dilatation. No obstructing lesion to suggest gastric outlet obstruction by CT. 3. Gallbladder is distended with apparent gallbladder wall  edema. Cholecystitis a concern by imaging. 4. Stable appearance of the posterior pancreatic head lesion with diffuse pancreatic parenchymal atrophy and chronic dilatation of the main pancreatic duct. By report, the pancreatic head lesion represents metastatic colorectal cancer. 5. Stable upper normal lymph nodes in the celiac axis. 6.  Aortic Atherosclerois (ICD10-170.0) 7. Stable anterior right upper lobe pulmonary nodule. Electronically Signed   By: Misty Stanley M.D.   On: 09/22/2018 17:30   Dg Chest Port 1 View  Result Date: 09/25/2018 CLINICAL DATA:  Diarrhea.  History of cancer and diabetes. EXAM: PORTABLE CHEST 1 VIEW COMPARISON:  Radiographs 09/22/2018.  CT 07/30/2018. FINDINGS: 1608 hours. Mild patient rotation to the right. Right subclavian Port-A-Cath appears unchanged at the lower SVC level. The heart size and mediastinal contours are stable. There is stable opacity at the right lung base and mild right lung nodularity. The left lung is clear. There is no pleural effusion or pneumothorax. IMPRESSION: Stable chest without acute findings. Electronically Signed   By: Richardean Sale M.D.   On: 09/25/2018 16:29    Microbiology: Recent Results (from the past 240 hour(s))  Culture, blood (Routine x 2)     Status: None (Preliminary result)   Collection Time: 10/16/18  6:29 PM   Specimen: Porta Cath; Blood  Result Value Ref Range Status   Specimen Description PORTA CATH  Final   Special Requests   Final    BOTTLES DRAWN AEROBIC AND ANAEROBIC Blood Culture adequate volume Performed at [facility] [address], [place], [place] [address]    Culture PENDING  Incomplete   Report Status PENDING  Incomplete  Culture, blood (Routine x 2)     Status: None (Preliminary result)   Collection Time: 10/16/18  7:03 PM   Specimen: Vein; Blood  Result Value Ref Range Status   Specimen Description RIGHT ANTECUBITAL  Final   Special Requests   Final    BOTTLES DRAWN AEROBIC AND ANAEROBIC Blood Culture  adequate volume Performed at [facility], [address], [place], [place] [address]    Culture PENDING  Incomplete   Report Status PENDING  Incomplete  SARS Coronavirus 2 (CEPHEID - Performed in [facility] hospital lab), Hosp Order     Status: None   Collection Time: 10/16/18  8:44 PM   Specimen: Nasopharyngeal Swab  Result Value Ref Range Status   SARS Coronavirus 2 NEGATIVE NEGATIVE Final    Comment: (NOTE) If result is NEGATIVE SARS-CoV-2 target nucleic acids are NOT DETECTED. The SARS-CoV-2 RNA is generally detectable in upper and lower  respiratory specimens during the acute phase of infection. The lowest  concentration of SARS-CoV-2 viral copies this assay can detect is 250  copies / mL. A negative result does not preclude SARS-CoV-2 infection  and should not be used as the sole basis for treatment or other  patient management decisions.  A negative result may occur with  improper specimen collection / handling, submission of specimen other  than nasopharyngeal swab, presence of viral mutation(s) within the  areas targeted by this assay, and inadequate number of viral copies  (<250 copies / mL). A negative result must be combined with clinical  observations, patient history, and epidemiological information. If result is POSITIVE SARS-CoV-2 target nucleic acids are DETECTED. The SARS-CoV-2 RNA is generally detectable in upper and lower  respiratory specimens dur ing the acute phase of infection.  Positive  results are indicative of active infection with SARS-CoV-2.  Clinical  correlation with patient history and other diagnostic information is  necessary to determine patient infection status.  Positive results do  not rule out bacterial infection or co-infection with other viruses. If result is PRESUMPTIVE POSTIVE SARS-CoV-2 nucleic acids MAY BE PRESENT.   A presumptive positive result was obtained on the submitted specimen  and confirmed on repeat testing.  While 2019  novel coronavirus  (SARS-CoV-2) nucleic acids may be present in the submitted sample  additional confirmatory testing may be necessary for epidemiological  and / or clinical management purposes  to differentiate between  SARS-CoV-2 and other Sarbecovirus currently known to infect humans.  If clinically indicated additional testing with an alternate test  methodology [phone]) is advised. The SARS-CoV-2 RNA is generally  detectable in upper and lower respiratory sp ecimens during the acute  phase of infection. The expected result is Negative. Fact Sheet for Patients:  StrictlyIdeas.no Fact Sheet for Healthcare Providers: BankingDealers.co.za This test is not yet approved or cleared by the [place] FDA and has been authorized for detection and/or diagnosis of SARS-CoV-2 by FDA under an Emergency Use Authorization (EUA).  This EUA will remain in effect (meaning this test can be used) for the duration of the COVID-19 declaration under Section 564(b)(1) of the Act, 21 U.S.C. section 360bbb-3(b)(1), unless the authorization is terminated or revoked sooner. Performed at [facility], [address], [place], [place] [address]   MRSA PCR Screening     Status: None   Collection Time: 10/16/18 11:30 PM   Specimen: Nasal Mucosa; Nasopharyngeal  Result Value Ref Range Status   MRSA by PCR NEGATIVE NEGATIVE Final    Comment:        The GeneXpert MRSA Assay (FDA approved for NASAL specimens only), is one component of a comprehensive MRSA colonization surveillance program. It is not intended to diagnose MRSA infection nor to guide or monitor treatment for MRSA infections. Performed at [facility], [address] [place], [place] [address]      Labs: Basic Metabolic Panel: Recent Labs  Lab 10/16/18 1828 10/17/18 0500  NA 132* 137  K 4.1 4.1  CL 97* 103  CO2 24 26  GLUCOSE 217* 97  BUN 15 11  CREATININE 0.84 0.74  CALCIUM 8.3*  8.1*  MG 1.8  --    Liver Function Tests: Recent Labs  Lab 10/16/18 1828  AST 20  ALT 19  ALKPHOS 79  BILITOT 1.0  PROT 6.1*  ALBUMIN 2.8*   Recent Labs  Lab 10/16/18 1828  LIPASE 24   CBC: Recent Labs  Lab 10/16/18 1828 10/17/18 0500  WBC 3.6* 2.8*  NEUTROABS 1.6*  --   HGB 11.2* 10.5*  HCT 33.4* 32.6*  MCV 94.1 96.4  PLT 202 191   Cardiac Enzymes: Recent Labs  Lab 10/16/18 1828  TROPONINI <0.03   BNP: BNP (last 3 results) Recent Labs    09/22/18 1529  BNP 37.0   CBG: Recent Labs  Lab 10/16/18 2223 10/17/18 0733  GLUCAP 217* 95    Signed:  Barton Dubois MD.  [facility] 10/17/2018, 11:12 AM

## 2018-10-17 NOTE — Progress Notes (Signed)
Port to right chest flushed with heparin and removed. Band-Aid applied. Tele removed. D/C instructions reviewed with patient, verbalized understanding. Patient notified wife, stable patient to be transported to private vehicle via wheelchair.

## 2018-10-18 ENCOUNTER — Inpatient Hospital Stay (HOSPITAL_COMMUNITY): Payer: Medicare Other

## 2018-10-18 ENCOUNTER — Inpatient Hospital Stay (HOSPITAL_BASED_OUTPATIENT_CLINIC_OR_DEPARTMENT_OTHER): Payer: Medicare Other | Admitting: Hematology

## 2018-10-18 ENCOUNTER — Encounter (HOSPITAL_COMMUNITY): Payer: Self-pay | Admitting: Hematology

## 2018-10-18 ENCOUNTER — Other Ambulatory Visit: Payer: Self-pay

## 2018-10-18 VITALS — BP 126/62 | HR 82 | Temp 98.3°F | Resp 18

## 2018-10-18 DIAGNOSIS — C7801 Secondary malignant neoplasm of right lung: Secondary | ICD-10-CM | POA: Diagnosis not present

## 2018-10-18 DIAGNOSIS — Z7984 Long term (current) use of oral hypoglycemic drugs: Secondary | ICD-10-CM

## 2018-10-18 DIAGNOSIS — C7802 Secondary malignant neoplasm of left lung: Secondary | ICD-10-CM

## 2018-10-18 DIAGNOSIS — C7800 Secondary malignant neoplasm of unspecified lung: Secondary | ICD-10-CM

## 2018-10-18 DIAGNOSIS — G629 Polyneuropathy, unspecified: Secondary | ICD-10-CM

## 2018-10-18 DIAGNOSIS — I10 Essential (primary) hypertension: Secondary | ICD-10-CM

## 2018-10-18 DIAGNOSIS — R112 Nausea with vomiting, unspecified: Secondary | ICD-10-CM

## 2018-10-18 DIAGNOSIS — G893 Neoplasm related pain (acute) (chronic): Secondary | ICD-10-CM | POA: Diagnosis not present

## 2018-10-18 DIAGNOSIS — Z86718 Personal history of other venous thrombosis and embolism: Secondary | ICD-10-CM

## 2018-10-18 DIAGNOSIS — Z5111 Encounter for antineoplastic chemotherapy: Secondary | ICD-10-CM | POA: Diagnosis not present

## 2018-10-18 DIAGNOSIS — C7889 Secondary malignant neoplasm of other digestive organs: Secondary | ICD-10-CM | POA: Diagnosis not present

## 2018-10-18 DIAGNOSIS — E119 Type 2 diabetes mellitus without complications: Secondary | ICD-10-CM | POA: Diagnosis not present

## 2018-10-18 DIAGNOSIS — Z86711 Personal history of pulmonary embolism: Secondary | ICD-10-CM

## 2018-10-18 DIAGNOSIS — G479 Sleep disorder, unspecified: Secondary | ICD-10-CM

## 2018-10-18 DIAGNOSIS — Z7901 Long term (current) use of anticoagulants: Secondary | ICD-10-CM

## 2018-10-18 DIAGNOSIS — C20 Malignant neoplasm of rectum: Secondary | ICD-10-CM

## 2018-10-18 DIAGNOSIS — Z933 Colostomy status: Secondary | ICD-10-CM | POA: Diagnosis not present

## 2018-10-18 DIAGNOSIS — Z79899 Other long term (current) drug therapy: Secondary | ICD-10-CM

## 2018-10-18 DIAGNOSIS — R197 Diarrhea, unspecified: Secondary | ICD-10-CM

## 2018-10-18 DIAGNOSIS — E860 Dehydration: Secondary | ICD-10-CM

## 2018-10-18 LAB — CBC WITH DIFFERENTIAL/PLATELET
Abs Immature Granulocytes: 0 10*3/uL (ref 0.00–0.07)
Basophils Absolute: 0 10*3/uL (ref 0.0–0.1)
Basophils Relative: 1 %
Eosinophils Absolute: 0.4 10*3/uL (ref 0.0–0.5)
Eosinophils Relative: 15 %
HCT: 34.6 % — ABNORMAL LOW (ref 39.0–52.0)
Hemoglobin: 11.4 g/dL — ABNORMAL LOW (ref 13.0–17.0)
Immature Granulocytes: 0 %
Lymphocytes Relative: 37 %
Lymphs Abs: 1 10*3/uL (ref 0.7–4.0)
MCH: 31.2 pg (ref 26.0–34.0)
MCHC: 32.9 g/dL (ref 30.0–36.0)
MCV: 94.8 fL (ref 80.0–100.0)
Monocytes Absolute: 0.4 10*3/uL (ref 0.1–1.0)
Monocytes Relative: 15 %
Neutro Abs: 0.8 10*3/uL — ABNORMAL LOW (ref 1.7–7.7)
Neutrophils Relative %: 32 %
Platelets: 231 10*3/uL (ref 150–400)
RBC: 3.65 MIL/uL — ABNORMAL LOW (ref 4.22–5.81)
RDW: 13.5 % (ref 11.5–15.5)
WBC: 2.6 10*3/uL — ABNORMAL LOW (ref 4.0–10.5)
nRBC: 0 % (ref 0.0–0.2)

## 2018-10-18 LAB — COMPREHENSIVE METABOLIC PANEL
ALT: 18 U/L (ref 0–44)
AST: 17 U/L (ref 15–41)
Albumin: 2.8 g/dL — ABNORMAL LOW (ref 3.5–5.0)
Alkaline Phosphatase: 72 U/L (ref 38–126)
Anion gap: 9 (ref 5–15)
BUN: 8 mg/dL (ref 8–23)
CO2: 26 mmol/L (ref 22–32)
Calcium: 8.9 mg/dL (ref 8.9–10.3)
Chloride: 105 mmol/L (ref 98–111)
Creatinine, Ser: 0.76 mg/dL (ref 0.61–1.24)
GFR calc Af Amer: >60 mL/min (ref >60)
GFR calc non Af Amer: >60 mL/min (ref >60)
Glucose, Bld: 160 mg/dL — ABNORMAL HIGH (ref 70–99)
Potassium: 4 mmol/L (ref 3.5–5.1)
Sodium: 140 mmol/L (ref 135–145)
Total Bilirubin: 1.1 mg/dL (ref 0.3–1.2)
Total Protein: 6.1 g/dL — ABNORMAL LOW (ref 6.5–8.1)

## 2018-10-18 LAB — URINE CULTURE: Culture: NO GROWTH

## 2018-10-18 LAB — HIV ANTIBODY (ROUTINE TESTING W REFLEX): HIV Screen 4th Generation wRfx: NONREACTIVE

## 2018-10-18 MED ORDER — SODIUM CHLORIDE 0.9% FLUSH
10.0000 mL | Freq: Once | INTRAVENOUS | Status: AC | PRN
  Administered 2018-10-18: 10 mL

## 2018-10-18 MED ORDER — SODIUM CHLORIDE 0.9 % IV SOLN
Freq: Once | INTRAVENOUS | Status: AC
  Administered 2018-10-18: 10:00:00 via INTRAVENOUS
  Filled 2018-10-18: qty 1000

## 2018-10-18 MED ORDER — HEPARIN SOD (PORK) LOCK FLUSH 100 UNIT/ML IV SOLN
500.0000 [IU] | Freq: Once | INTRAVENOUS | Status: AC | PRN
  Administered 2018-10-18: 500 [IU]

## 2018-10-18 MED ORDER — DRONABINOL 5 MG PO CAPS: 5.0000 mg | ORAL_CAPSULE | Freq: Two times a day (BID) | ORAL | 3 refills | Status: DC

## 2018-10-18 NOTE — Progress Notes (Addendum)
[place], [place] [address]   CLINIC:  Medical Oncology/Hematology  PCP:  [name], MD [place]  [id number] [phone]   REASON FOR VISIT:  Follow-up for rectal cancer   BRIEF ONCOLOGIC HISTORY:  Oncology History  Rectal cancer ([place])  07/24/2010 Initial Diagnosis   Invasive adenocarcinoma of rectum   09/09/2010 Concurrent Chemotherapy   S/P radiation and concurrent 5-FU continuous infusion from 09/09/10- 10/10/10.   11/14/2010 Surgery   S/P proctectomy with colorectal anastomosis and diverting loop ileostomy on 11/14/10 at [facility] by Dr. Harlon Ditty. Pathology reveals a pT3b N1 with 3/20 lymph nodes.   02/05/2011 - 07/14/2011 Chemotherapy   FOLFOX   08/18/2011 Surgery   Approximate date of surgery- [place] by Dr. Harlon Ditty    Remission     11/24/2013 Survivorship   Colonoscopy- somewhat fibrotic/friable anastomotic mucosal-status post biopsy (narrowing not felt to be clinically significant).  Negative pathology for malignancy   09/12/2014 Imaging   DVT in the left femoral venous system, left common iliac vein, IVC, and within the IVC filter Right lower extremity venography confirms chronic occlusion of the femoral venous system with collateralization. The right iliac venous system is patent and do   09/25/2014 PET scan   The right middle lobe pulmonary nodule is hypermetabolic, favored to represent a primary bronchogenic carcinoma.Equivocal mediastinal nodes, similar to surrounding blood pool. Bilateral adrenal hypermetabolism, felt to be physiologic   10/24/2014 Pathology Results   Lung, needle/core biopsy(ies), RML - ADENOCARCINOMA, SEE COMMENT metastatic adenocarcinoma of a colorectal primary   10/24/2014 Relapse/Recurrence     11/16/2014 Definitive Surgery   Bronchoscopy, right video-assisted thoracoscopy, wedge resection of right middle lobe by Dr. Servando Snare   11/16/2014 Pathology Results   Lung, wedge  biopsy/resection, right lingula and small portion of middle lobe - METASTATIC ADENOCARCINOMA, CONSISTENT WITH COLORECTAL PRIMARY, SPANNING 2.0 CM. - THE SURGICAL RESECTION MARGINS ARE NEGATIVE FOR ADENOCARCINOMA.   11/16/2014 Remission   THE SURGICAL RESECTION MARGINS ARE NEGATIVE FOR ADENOCARCINOMA.   03/07/2015 Imaging   CT CAP- Interval resection of right middle lobe metastasis. No acute process or evidence of metastatic disease in the chest, abdomen or pelvis. Improved right upper lobe reticular nodular opacities are favored to represent resolving infection.   09/14/2015 Imaging   CT CAP- No findings of recurrent malignancy. No recurrence along the wedge resection site of the right middle lobe. Right anterior abdominal wall focal hernia containing a knuckle of small bowel without complicating feature.   06/13/2016 Imaging   CT CAP- 1. New right-sided pleural metastasis/mass. Other smaller right-sided pulmonary nodules which are all pleural-based and most likely represent pleural metastasis. 2. No convincing evidence of abdominopelvic nodal metastasis. 3. Constellation of findings, including pancreatic atrophy, duct dilatation, and pancreatic head soft tissue fullness which are highly suspicious for pancreatic adenocarcinoma. Metastatic disease felt much less likely. Consider endoscopic ultrasound sampling or ERCP. Cannot exclude superimposed acute pancreatitis. 4. New enlargement of the appendiceal tip with subtle surrounding edema. Cannot exclude early or mild appendicitis. 5. Coronary artery atherosclerosis. Aortic atherosclerosis. 6. 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   06/13/2016 Progression   CT scan demonstrates progression of disease   06/19/2016 Procedure   EUS with FNA by Dr. Ardis Hughs   06/20/2016 Pathology Results   FINE NEEDLE ASPIRATION, ENDOSCOPIC, PANCREAS UNCINATE AREA(SPECIMEN 1 OF 1 COLLECTED 06/19/16): MALIGNANT CELLS CONSISTENT WITH  METASTATIC ADENOCARCINOMA.   06/25/2016 PET scan  1. Two pleural-based nodules in the right hemithorax are hypermetabolic. Metastatic disease is a distinct consideration. The scattered pulmonary parenchymal nodule seen on previous diagnostic CT imaging are below the threshold for reliable resolution on PET imaging. 2. Hypermetabolic lesion pancreatic head, consistent with neoplasm. As noted on prior CT, adenocarcinoma is any consideration. No evidence for hypermetabolic abdominal lymphadenopathy. Mottled uptake noted in the liver, but no discrete hepatic metastases are evident on PET imaging. 3. Appendix remains distended up to 0.9-10 mm diameter with a stone towards the tip in subtle periappendiceal edema/inflammation. The appendix is hypermetabolic along its length. Imaging features are relatively stable in the 12 day interval since prior CT scan. While appendicitis is a consideration, the relative stability over 12 days would be unusual for that etiology. Continued close follow-up recommended.   06/26/2016 Pathology Results   Not enough tissue for foundationONE or K-ras testing.   07/07/2016 Procedure   Port placed by Dr. Arnoldo Morale   07/08/2016 -  Chemotherapy   The patient had pegfilgrastim (NEULASTA) injection 6 mg, 6 mg, Subcutaneous,  Once, 0 of 4 cycles  ondansetron (ZOFRAN) IVPB 8 mg, 8 mg, Intravenous,  Once, 0 of 4 cycles  leucovorin 804 mg in dextrose 5 % 250 mL infusion, 400 mg/m2, Intravenous,  Once, 0 of 4 cycles  oxaliplatin (ELOXATIN) 170 mg in dextrose 5 % 500 mL chemo infusion, 85 mg/m2, Intravenous,  Once, 0 of 4 cycles  fluorouracil (ADRUCIL) 4,800 mg in sodium chloride 0.9 % 150 mL chemo infusion, 2,400 mg/m2 = 4,800 mg, Intravenous, 1 Day/Dose, 0 of 4 cycles  fluorouracil (ADRUCIL) chemo injection 800 mg, 400 mg/m2, Intravenous,  Once, 0 of 4 cycles  pegfilgrastim (NEULASTA) injection 6 mg, 6 mg, Subcutaneous,  Once, 2 of 2 cycles  ondansetron (ZOFRAN) IVPB 8  mg, 8 mg, Intravenous,  Once, 12 of 12 cycles  leucovorin 660 mg in dextrose 5 % 250 mL infusion, 660 mg (original dose ), Intravenous,  Once, 12 of 12 cycles Dose modification: 660 mg (Cycle 1)  oxaliplatin (ELOXATIN) 140 mg in dextrose 5 % 500 mL chemo infusion, 140 mg (original dose ), Intravenous,  Once, 12 of 12 cycles Dose modification: 140 mg (Cycle 1), 119 mg (85 % of original dose 140 mg, Cycle 8, Reason: Provider Judgment)  fluorouracil (ADRUCIL) 3,850 mg in sodium chloride 0.9 % 150 mL chemo infusion, 3,850 mg (original dose ), Intravenous, 1 Day/Dose, 12 of 12 cycles Dose modification: 3,850 mg (Cycle 1)  fluorouracil (ADRUCIL) chemo injection 700 mg, 700 mg (original dose ), Intravenous,  Once, 12 of 12 cycles Dose modification: 700 mg (Cycle 1)  palonosetron (ALOXI) injection 0.25 mg, 0.25 mg, Intravenous,  Once, 7 of 12 cycles Administration: 0.25 mg (09/30/2016)  bevacizumab (AVASTIN) 475 mg in sodium chloride 0.9 % 100 mL chemo infusion, 5 mg/kg = 475 mg, Intravenous,  Once, 6 of 11 cycles Administration: 500 mg (09/30/2016)  leucovorin 880 mg in dextrose 5 % 250 mL infusion, 400 mg/m2 = 880 mg, Intravenous,  Once, 7 of 12 cycles Administration: 880 mg (09/30/2016)  oxaliplatin (ELOXATIN) 185 mg in dextrose 5 % 500 mL chemo infusion, 85 mg/m2 = 185 mg, Intravenous,  Once, 7 of 12 cycles Administration: 185 mg (09/30/2016)  fluorouracil (ADRUCIL) chemo injection 900 mg, 400 mg/m2 = 900 mg, Intravenous,  Once, 7 of 12 cycles Administration: 900 mg (09/30/2016)  fluorouracil (ADRUCIL) 5,300 mg in sodium chloride 0.9 % 144 mL chemo infusion, 2,400 mg/m2 = 5,300 mg, Intravenous, 1 Day/Dose, 7  of 12 cycles Administration: 5,300 mg (09/30/2016)  for chemotherapy treatment.     08/27/2016 Imaging   CT CAP- 1. Pulmonary metastatic disease is stable. 2. Pancreatic head mass, grossly stable, with progressive atrophy of the body and tail of the pancreas. Stable peripancreatic lymph  node. 3. Mild circumferential rectal wall thickening, stable. 4. Aortic atherosclerosis (ICD10-170.0). Coronary artery calcification. 5. Hyperattenuating lesion off the lower pole left kidney, stable, too small to characterize. Continued attention on followup exams is warranted. 6. Persistent wall thickening and mild dilatation of the proximal appendix, of uncertain etiology. Continued attention on followup exams is warranted.   11/06/2016 Imaging   CT C/A/P: IMPRESSION: 1. Stable pulmonary metastatic disease. 2. Similar appearing pancreatic head mass. Progressed atrophy of the pancreatic body and tail. Stable peripancreatic lymph node. 3. Stable hypoattenuating lesion partially exophytic off the inferior pole of the left kidney. 4. Aortic atherosclerosis. 5. Persistent mild wall thickening and mild dilatation of the appendix.    01/30/2017 Imaging   Ct C/A/P: IMPRESSION: 1. Stable pulmonary metastatic disease. 2. Stable to slight increase in size of pancreatic head mass. 3.  Aortic Atherosclerosis (ICD10-I70.0). 4. Similar appearance of mild wall thickening of the appendix which contains an appendicolith.   05/01/2017 Imaging   CT C/A/P: Stable disease in the lungs, no progression of disease in the abdomen.   10/04/2018 -  Chemotherapy   The patient had palonosetron (ALOXI) injection 0.25 mg, 0.25 mg, Intravenous,  Once, 1 of 4 cycles Administration: 0.25 mg (10/04/2018) pegfilgrastim (NEULASTA) injection 6 mg, 6 mg, Subcutaneous, Once, 0 of 3 cycles bevacizumab (AVASTIN) 450 mg in sodium chloride 0.9 % 100 mL chemo infusion, 5 mg/kg = 450 mg, Intravenous,  Once, 1 of 4 cycles irinotecan (CAMPTOSAR) 380 mg in sodium chloride 0.9 % 500 mL chemo infusion, 180 mg/m2 = 380 mg, Intravenous,  Once, 1 of 4 cycles Administration: 380 mg (10/04/2018) leucovorin 900 mg in sodium chloride 0.9 % 250 mL infusion, 421 mg/m2 = 856 mg, Intravenous,  Once, 1 of 4 cycles Administration: 900 mg  (10/04/2018) fluorouracil (ADRUCIL) chemo injection 850 mg, 400 mg/m2 = 850 mg, Intravenous,  Once, 1 of 4 cycles Administration: 850 mg (10/04/2018) fluorouracil (ADRUCIL) 5,000 mg in sodium chloride 0.9 % 150 mL chemo infusion, 2,325 mg/m2 = 5,150 mg, Intravenous, 1 Day/Dose, 1 of 4 cycles Administration: 5,000 mg (10/04/2018)  for chemotherapy treatment.       CANCER STAGING: Cancer Staging Rectal cancer ([place]) Staging form: Colon and Rectum, AJCC 7th Edition - Clinical: Stage IIIB (T3, N1, M0) - Signed by [name], MD on 02/04/2011 - Pathologic stage from 11/17/2014: Stage IVA (M1a) - Signed by Baird Cancer, PA-C on 09/14/2015    INTERVAL HISTORY:  Mr. Hatfield 68 y.o. male is here for cycle 2 of chemotherapy.  Cycle 1 of FOLFIRI was on 10/04/2018.  He was reportedly admitted to the hospital from 10/16/2018 through 10/17/2018 with nausea vomiting diarrhea and dehydration.  No infection was noted.  He reported that he had dry heaves and nausea.  As result he was eating less.  Marinol 2.5 mg twice daily helped but not significantly.  He was emptying his bag twice daily.  But it is more watery.  Denies any abdominal cramping.  Appetite and energy levels are 25%.   REVIEW OF SYSTEMS:  Review of Systems  Respiratory: Positive for shortness of breath.   Cardiovascular: Negative for chest pain.  Gastrointestinal: Positive for diarrhea, nausea and vomiting.  Neurological: Positive for  numbness.  All other systems reviewed and are negative.    PAST MEDICAL/SURGICAL HISTORY:  Past Medical History:  Diagnosis Date  . Chronic anticoagulation   . Colon cancer ([place]) 07/24/2010   rectal ca, inv adenocarcinoma  . Depression 04/01/2011  . Diabetes mellitus without complication ([place])   . DVT (deep venous thrombosis) ([place]) 05/09/2011  . GERD (gastroesophageal reflux disease)   . High output ileostomy ([place]) 05/21/2011  . History of kidney stones   . HTN (hypertension)   . Hx of radiation  therapy 09/02/10 to 10/14/10   pelvis  . Lung metastasis ([place])   . Neuropathy   . Peripheral vascular disease ([place])    dvt's,pe  . Pneumonia    hx x3  . Pulmonary embolism ([place])   . Rectal cancer ([place]) 01/15/2011   S/P radiation and concurrent 5-FU continuous infusion from 09/09/10- 10/10/10.  S/P proctectomy with colorectal anastomosis and diverting loop ileostomy on 11/14/10 at [facility] by Dr. Harlon Ditty. Pathology reveals a pT3b N1 with 3/20 lymph nodes.     Past Surgical History:  Procedure Laterality Date  . BILIARY STENT PLACEMENT N/A 09/16/2018   Procedure: STENT PLACEMENT;  Surgeon: Rogene Houston, MD;  [facility];  Service: Endoscopy;  Laterality: N/A;  . BIOPSY  09/24/2018   Procedure: BIOPSY;  Surgeon: Danie Binder, MD;  [facility];  Service: Endoscopy;;  . COLON SURGERY  11/14/2010   proctectomy with colorectal anastomosis and diverting loop ileostomy (temporary planned)  . COLONOSCOPY  07/2010   proximal rectal apple core mass 10-14cm from anal verge (adenocarcinoma), 2-3cm distal rectal carpet polyp s/p piecemeal snare polypectomy (adenoma)  . COLONOSCOPY  04/21/2012   RMR: Friable,fibrotic appearing colorectal anastomosis producing some luminal narrowing-not felt to be critical. path: focal erosion with slight inflammation and hyperemia. SURVEILLANCE DUE DEC 2015  . COLONOSCOPY N/A 11/24/2013   Dr. Rourk:somewhat fibrotic/friable anastomotic mucosal-status post biopsy (narrowing not felt to be clinically significant) Single colonic diverticulum. benign polypoid rectal mucosa  . colostomy reversal  april 2013  . ERCP N/A 09/16/2018   Procedure: ENDOSCOPIC RETROGRADE CHOLANGIOPANCREATOGRAPHY (ERCP);  Surgeon: Rogene Houston, MD;  [facility];  Service: Endoscopy;  Laterality: N/A;  . ESOPHAGOGASTRODUODENOSCOPY  07/2010   RMR: schatki ring s/p dilation, small hh, SB bx benign  . ESOPHAGOGASTRODUODENOSCOPY N/A 09/04/2015   Procedure:  ESOPHAGOGASTRODUODENOSCOPY (EGD);  Surgeon: Daneil Dolin, MD;  [facility];  Service: Endoscopy;  Laterality: N/A;  730  . EUS  08/2010   Dr. Owens Loffler. uT3N0 circumferential, nearly obstruction rectosigmoid adenocarcinoma, distal edge 12cm from anal verge  . EUS N/A 06/19/2016   Procedure: UPPER ENDOSCOPIC ULTRASOUND (EUS) RADIAL;  Surgeon: Milus Banister, MD;  Location: [facility];  Service: Endoscopy;  Laterality: N/A;  . FLEXIBLE SIGMOIDOSCOPY N/A 09/24/2018   Procedure: FLEXIBLE SIGMOIDOSCOPY;  Surgeon: Danie Binder, MD;  [facility];  Service: Endoscopy;  Laterality: N/A;  . HERNIA REPAIR     abd hernia repair  . IVC filter    . ivc filter    . port a cath placement    . PORT-A-CATH REMOVAL  09/24/2011   Procedure: REMOVAL PORT-A-CATH;  Surgeon: Donato Heinz, MD;  Location: [facility];  Service: General;  Laterality: N/A;  Minor Room  . PORTACATH PLACEMENT Right 07/07/2016   Procedure: INSERTION PORT-A-CATH;  Surgeon: Aviva Signs, MD;  Location: [facility];  Service: General;  Laterality: Right;  . SPHINCTEROTOMY N/A 09/16/2018  Procedure: SPHINCTEROTOMY with balloon dialation;  Surgeon: Rogene Houston, MD;  [facility];  Service: Endoscopy;  Laterality: N/A;  . TRANSVERSE LOOP COLOSTOMY N/A 09/29/2018   Procedure: TRANSVERSE LOOP COLOSTOMY;  Surgeon: Aviva Signs, MD;  Location: [facility];  Service: General;  Laterality: N/A;  . VIDEO ASSISTED THORACOSCOPY (VATS)/WEDGE RESECTION Right 11/15/2014   Procedure: VIDEO ASSISTED THORACOSCOPY (VATS)/LUNG RESECTION WITH RIGHT LINGULECTOMY;  Surgeon: [name], MD;  Location: [facility];  Service: Thoracic;  Laterality: Right;  Marland Kitchen VIDEO BRONCHOSCOPY N/A 11/15/2014   Procedure: VIDEO BRONCHOSCOPY;  Surgeon: [name], MD;  Location[facility] OR;  Service: Thoracic;  Laterality: N/A;     SOCIAL HISTORY:  Social History   Socioeconomic History  . Marital status: Married    Spouse name: Not on file  .  Number of children: 2  . Years of education: Not on file  . Highest education level: Not on file  Occupational History  . Occupation: self-employed Regulatory affairs officer, currently not working    Fish farm manager: SELF EMPLOYED  Social Needs  . Financial resource strain: Not very hard  . Food insecurity    Worry: Never true    Inability: Never true  . Transportation needs    Medical: No    Non-medical: No  Tobacco Use  . Smoking status: Never Smoker  . Smokeless tobacco: Never Used  Substance and Sexual Activity  . Alcohol use: No  . Drug use: No  . Sexual activity: Yes    Birth control/protection: None    Comment: married  Lifestyle  . Physical activity    Days per week: 0 days    Minutes per session: 0 min  . Stress: Only a little  Relationships  . Social connections    Talks on phone: More than three times a week    Gets together: Once a week    Attends religious service: Patient refused    Active member of club or organization: Patient refused    Attends meetings of clubs or organizations: Patient refused    Relationship status: Patient refused  . Intimate partner violence    Fear of current or ex partner: Patient refused    Emotionally abused: Patient refused    Physically abused: Patient refused    Forced sexual activity: Patient refused  Other Topics Concern  . Not on file  Social History Narrative  . Not on file    FAMILY HISTORY:  Family History  Problem Relation Age of Onset  . Cancer Brother        throat  . Cancer Brother        prostate  . Colon cancer Neg Hx   . Liver disease Neg Hx   . Inflammatory bowel disease Neg Hx     CURRENT MEDICATIONS:  Outpatient Encounter Medications as of 10/18/2018  Medication Sig  . acetaminophen (TYLENOL) 500 MG tablet Take 1,000 mg by mouth every 6 (six) hours as needed for mild pain or moderate pain.   . clonazePAM (KLONOPIN) 0.5 MG tablet Take 1 tablet (0.5 mg total) by mouth 2 (two) times daily as needed.   . Diphenhyd-Hydrocort-Nystatin (FIRST-DUKES MOUTHWASH) SUSP Use as directed 5 mLs in the mouth or throat 4 (four) times daily.  . diphenoxylate-atropine (LOMOTIL) 2.5-0.025 MG tablet Take 1 tablet by mouth 4 (four) times daily as needed for diarrhea or loose stools.  Marland Kitchen dronabinol (MARINOL) 5 MG capsule Take 1 capsule (5 mg total) by mouth 2 (two) times daily before a  meal.  . gabapentin (NEURONTIN) 300 MG capsule Take 3 capsules (900 mg total) by mouth 2 (two) times daily.  Marland Kitchen glimepiride (AMARYL) 2 MG tablet Take 2 mg by mouth daily with breakfast.   . mirtazapine (REMERON) 7.5 MG tablet Take 1 tablet (7.5 mg total) by mouth at bedtime.  Marland Kitchen omeprazole (PRILOSEC) 20 MG capsule Take 20 mg by mouth 2 (two) times daily before a meal.   . ondansetron (ZOFRAN) 8 MG tablet Take 1 tablet (8 mg total) by mouth 2 (two) times daily as needed for refractory nausea / vomiting. Start on day 3 after chemotherapy.  Marland Kitchen oxyCODONE-acetaminophen (PERCOCET) 10-325 MG tablet Take 1 tablet by mouth every 6 (six) hours as needed for pain.  . Probiotic Product (PROBIOTIC DAILY PO) Take 1 capsule by mouth daily.  . prochlorperazine (COMPAZINE) 10 MG tablet Take 1 tablet (10 mg total) by mouth every 6 (six) hours as needed for nausea or vomiting.  . promethazine (PHENERGAN) 25 MG tablet Take 1 tablet (25 mg total) by mouth every 8 (eight) hours as needed for refractory nausea / vomiting.  . rivaroxaban (XARELTO) 20 MG TABS tablet Take 1 tablet (20 mg total) by mouth daily with supper.  . traZODone (DESYREL) 100 MG tablet Take 1 tablet (100 mg total) by mouth at bedtime.  . vitamin B-12 (CYANOCOBALAMIN) 1000 MCG tablet Take 1,000 mcg by mouth daily.  . [DISCONTINUED] dronabinol (MARINOL) 2.5 MG capsule Take 1 capsule (2.5 mg total) by mouth 2 (two) times daily before a meal.   Facility-Administered Encounter Medications as of 10/18/2018  Medication  . sodium chloride 0.9 % injection 10 mL  . sodium chloride flush (NS) 0.9 %  injection 10 mL    ALLERGIES:  Allergies  Allergen Reactions  . Oxycodone     Blisters, hallucinations  . Tramadol     Blisters, hallucinations  . Trazodone And Nefazodone Other (See Comments)    hallucinations     PHYSICAL EXAM:  ECOG Performance status: 1  Blood pressure is 110/69.  Pulse is 95.  Respiratory rate is 19.  Temperature 98.2.  Oxygen saturations are 100%.  Physical Exam Vitals signs reviewed.  Constitutional:      Appearance: Normal appearance.  Cardiovascular:     Rate and Rhythm: Normal rate and regular rhythm.     Heart sounds: Normal heart sounds.  Pulmonary:     Effort: Pulmonary effort is normal.     Breath sounds: Normal breath sounds.  Abdominal:     General: There is no distension.     Palpations: Abdomen is soft. There is no mass.  Musculoskeletal:        General: No swelling.  Skin:    General: Skin is warm.  Neurological:     General: No focal deficit present.     Mental Status: He is alert and oriented to person, place, and time.  Psychiatric:        Mood and Affect: Mood normal.        Behavior: Behavior normal.      LABORATORY DATA:  I have reviewed the labs as listed.  CBC    Component Value Date/Time   WBC 2.6 (L) 10/18/2018 0827   RBC 3.65 (L) 10/18/2018 0827   HGB 11.4 (L) 10/18/2018 0827   HGB 11.6 (L) 02/04/2011 1059   HCT 34.6 (L) 10/18/2018 0827   HCT 34.7 (L) 02/04/2011 1059   PLT 231 10/18/2018 0827   PLT 282 02/04/2011 1059   MCV 94.8  10/18/2018 0827   MCV 76.0 (L) 02/04/2011 1059   MCH 31.2 10/18/2018 0827   MCHC 32.9 10/18/2018 0827   RDW 13.5 10/18/2018 0827   RDW 18.5 (H) 02/04/2011 1059   LYMPHSABS 1.0 10/18/2018 0827   LYMPHSABS 1.1 02/04/2011 1059   MONOABS 0.4 10/18/2018 0827   MONOABS 0.5 02/04/2011 1059   EOSABS 0.4 10/18/2018 0827   EOSABS 0.1 02/04/2011 1059   BASOSABS 0.0 10/18/2018 0827   BASOSABS 0.1 02/04/2011 1059   CMP Latest Ref Rng & Units 10/18/2018 10/17/2018 10/16/2018  Glucose 70  - 99 mg/dL 160(H) 97 217(H)  BUN 8 - 23 mg/dL '8 11 15  ' Creatinine 0.61 - 1.24 mg/dL 0.76 0.74 0.84  Sodium 135 - 145 mmol/L 140 137 132(L)  Potassium 3.5 - 5.1 mmol/L 4.0 4.1 4.1  Chloride 98 - 111 mmol/L 105 103 97(L)  CO2 22 - 32 mmol/L '26 26 24  ' Calcium 8.9 - 10.3 mg/dL 8.9 8.1(L) 8.3(L)  Total Protein 6.5 - 8.1 g/dL 6.1(L) - 6.1(L)  Total Bilirubin 0.3 - 1.2 mg/dL 1.1 - 1.0  Alkaline Phos 38 - 126 U/L 72 - 79  AST 15 - 41 U/L 17 - 20  ALT 0 - 44 U/L 18 - 19       DIAGNOSTIC IMAGING:  I have independently reviewed the scans and discussed with the patient.    ASSESSMENT & PLAN:   Rectal cancer 1.  Stage IV rectal cancer with lung and pancreatic metastasis: -12 cycles of FOLFOX in the adjuvant setting from October 2012 through 07/14/2011. -15 cycles of FOLFOX and bevacizumab from 07/08/2016 through 01/20/2017, followed by infusional 5-FU and bevacizumab until 07/14/2017 -Maintenance Xeloda started on 08/07/2017 with bevacizumab, dose reduced to 1500 mg twice daily during cycle 2.  Prior to that he was on 5-FU maintenance. -CT CAP on 07/30/2018 showed no significant progressive metastatic disease.  No significant change in the right upper lobe solid pulmonary nodules.  Stable mixed lytic and sclerotic lower sternal lesion.  Stable malignant pancreatic head mass. -MRCP showed intrahepatic and extrahepatic biliary dilatation with stricture at common bile duct.  Mass in the head of the pancreas has increased in size to 2.1 cm, previously 1.7 cm. -ERCP and stent placement on 09/16/2018, showing a single localized biliary stricture.  Biliary sphincterotomy was also performed and stricture dilated to 6 mm with the balloon to facilitate stent placement.  1 covered metal stent was placed. -Flexible sigmoidoscopy on 09/24/2018 shows narrowing of the colorectal anastomosis. - Transverse loop colostomy on 09/29/2018. -Cycle 1 of FOLFIRI on 10/04/2018.  We will plan to add bevacizumab during cycle 2. -He  was admitted from 10/16/2018 through 10/17/2018 for nausea and dry heaves.  He was emptying his bag 2 times a day but it was more liquidy.  He normally empties the bag twice a day.  No cramping reported.  He felt very tired after his treatment. -We reviewed blood work.  ANC is 800 today with low white count of 2.6.  We will hold his chemotherapy today.  I will cut back on his next cycle dose by 20%.  I will also add Neulasta to the regimen.  His blood pressure is borderline low.  I will give him 1 L of fluid with electrolytes. -I will reevaluate him in 1 week.  2.  Peripheral neuropathy: -We will continue gabapentin 900 mg twice daily.   3.  DVT and PE: -Diagnosed in 2016, status post IVC filter. -Xarelto was temporarily held.  He  started back on it.  4.  Rectal pain: -He will continue hydrocodone as needed.  5.  Sleep problems: -Trazodone 100 mg tablet at bedtime is helping.  6.  Decreased appetite: -I have given him a prescription for Megace 2.5 mg twice daily on 10/04/2018.  He started taking it and it helped mildly.  I will increase it to 5 mg twice daily.     Total time spent is 25 minutes with more than 50% of the time spent face-to-face discussing change in therapy, side effects and coordination of care.  Orders placed this encounter:  No orders of the defined types were placed in this encounter.     Derek Jack, MD [place] [phone]

## 2018-10-18 NOTE — Patient Instructions (Signed)
[facility] Discharge Instructions  You were seen today by Dr. Katragadda. He went over your recent lab results. He will see you back in 1 week for labs, treatment and follow up.   Thank you for choosing [facility] to provide your oncology and hematology care.  To afford each patient quality time with our provider, please arrive at least 15 minutes before your scheduled appointment time.   If you have a lab appointment with the Cancer Center please come in thru the  Main Entrance and check in at the main information desk  You need to re-schedule your appointment should you arrive 10 or more minutes late.  We strive to give you quality time with our providers, and arriving late affects you and other patients whose appointments are after yours.  Also, if you no show three or more times for appointments you may be dismissed from the clinic at the providers discretion.     Again, thank you for choosing  Cancer Center.  Our hope is that these requests will decrease the amount of time that you wait before being seen by our physicians.       _____________________________________________________________  Should you have questions after your visit to  Cancer Center, please contact our office at [phone] between the hours of 8:00 a.m. and 4:30 p.m.  Voicemails left after 4:00 p.m. will not be returned until the following business day.  For prescription refill requests, have your pharmacy contact our office and allow 72 hours.    Cancer Center Support Programs:   > Cancer Support Group  2nd Tuesday of the month 1pm-2pm, Journey Room    

## 2018-10-18 NOTE — Assessment & Plan Note (Addendum)
1.  Stage IV rectal cancer with lung and pancreatic metastasis: -12 cycles of FOLFOX in the adjuvant setting from October 2012 through 07/14/2011. -15 cycles of FOLFOX and bevacizumab from 07/08/2016 through 01/20/2017, followed by infusional 5-FU and bevacizumab until 07/14/2017 -Maintenance Xeloda started on 08/07/2017 with bevacizumab, dose reduced to 1500 mg twice daily during cycle 2.  Prior to that he was on 5-FU maintenance. -CT CAP on 07/30/2018 showed no significant progressive metastatic disease.  No significant change in the right upper lobe solid pulmonary nodules.  Stable mixed lytic and sclerotic lower sternal lesion.  Stable malignant pancreatic head mass. -MRCP showed intrahepatic and extrahepatic biliary dilatation with stricture at common bile duct.  Mass in the head of the pancreas has increased in size to 2.1 cm, previously 1.7 cm. -ERCP and stent placement on 09/16/2018, showing a single localized biliary stricture.  Biliary sphincterotomy was also performed and stricture dilated to 6 mm with the balloon to facilitate stent placement.  1 covered metal stent was placed. -Flexible sigmoidoscopy on 09/24/2018 shows narrowing of the colorectal anastomosis. - Transverse loop colostomy on 09/29/2018. -Cycle 1 of FOLFIRI on 10/04/2018.  We will plan to add bevacizumab during cycle 2. -He was admitted from 10/16/2018 through 10/17/2018 for nausea and dry heaves.  He was emptying his bag 2 times a day but it was more liquidy.  He normally empties the bag twice a day.  No cramping reported.  He felt very tired after his treatment. -We reviewed blood work.  ANC is 800 today with low white count of 2.6.  We will hold his chemotherapy today.  I will cut back on his next cycle dose by 20%.  I will also add Neulasta to the regimen.  His blood pressure is borderline low.  I will give him 1 L of fluid with electrolytes. -I will reevaluate him in 1 week.  2.  Peripheral neuropathy: -We will continue gabapentin  900 mg twice daily.   3.  DVT and PE: -Diagnosed in 2016, status post IVC filter. -Xarelto was temporarily held.  He started back on it.  4.  Rectal pain: -He will continue hydrocodone as needed.  5.  Sleep problems: -Trazodone 100 mg tablet at bedtime is helping.  6.  Decreased appetite: -I have given him a prescription for Megace 2.5 mg twice daily on 10/04/2018.  He started taking it and it helped mildly.  I will increase it to 5 mg twice daily.

## 2018-10-18 NOTE — Progress Notes (Signed)
Pt presents today for treatment and appointment with Dr. [name]. VSS. Pt states he was discharged from [facility] Sunday 10/17/2018. MAR reviewed and updated.

## 2018-10-18 NOTE — Progress Notes (Signed)
Hold treatment today per ATravis LPN. Give house wine today only.   IV hydration given today per MD orders. Tolerated infusion without adverse affects. Vital signs stable. No complaints at this time. Discharged from clinic ambulatory. F/U with [facility] as scheduled.

## 2018-10-19 ENCOUNTER — Encounter: Payer: Self-pay | Admitting: General Surgery

## 2018-10-19 ENCOUNTER — Ambulatory Visit (INDEPENDENT_AMBULATORY_CARE_PROVIDER_SITE_OTHER): Payer: Self-pay | Admitting: General Surgery

## 2018-10-19 DIAGNOSIS — C799 Secondary malignant neoplasm of unspecified site: Secondary | ICD-10-CM | POA: Diagnosis not present

## 2018-10-19 DIAGNOSIS — C20 Malignant neoplasm of rectum: Secondary | ICD-10-CM | POA: Diagnosis not present

## 2018-10-19 DIAGNOSIS — Z09 Encounter for follow-up examination after completed treatment for conditions other than malignant neoplasm: Secondary | ICD-10-CM

## 2018-10-19 DIAGNOSIS — Z299 Encounter for prophylactic measures, unspecified: Secondary | ICD-10-CM | POA: Diagnosis not present

## 2018-10-19 DIAGNOSIS — Z6824 Body mass index (BMI) 24.0-24.9, adult: Secondary | ICD-10-CM | POA: Diagnosis not present

## 2018-10-19 DIAGNOSIS — I82409 Acute embolism and thrombosis of unspecified deep veins of unspecified lower extremity: Secondary | ICD-10-CM | POA: Diagnosis not present

## 2018-10-19 NOTE — Progress Notes (Signed)
Subjective:     Charles [name]  Patient doing well from the colostomy standpoint.  States that dark area over the colostomy tissue has sloughed off.  Is pleased that he had the colostomy done as he has had no rectal issues since that time. Objective:    BP 121/74 (BP Location: Left Arm, Patient Position: Sitting, Cuff Size: Normal)   Pulse 94   Temp 97.7 F (36.5 C) (Temporal)   Resp 16   Ht 6\' 2"  (1.88 m)   Wt 181 lb (82.1 kg)   SpO2 98%   BMI 23.24 kg/m   General:  alert, cooperative and no distress  Abdomen soft, colostomy pink and patent.  No necrotic tissue present.     Assessment:    Doing well postoperatively.    Plan:   Patient instructed to call me should he develop any future issues with the colostomy.  Follow-up here as needed.

## 2018-10-20 ENCOUNTER — Encounter (HOSPITAL_COMMUNITY): Payer: Medicare Other

## 2018-10-21 DIAGNOSIS — T451X5S Adverse effect of antineoplastic and immunosuppressive drugs, sequela: Secondary | ICD-10-CM | POA: Diagnosis not present

## 2018-10-21 DIAGNOSIS — C250 Malignant neoplasm of head of pancreas: Secondary | ICD-10-CM | POA: Diagnosis not present

## 2018-10-21 DIAGNOSIS — K9189 Other postprocedural complications and disorders of digestive system: Secondary | ICD-10-CM | POA: Diagnosis not present

## 2018-10-21 DIAGNOSIS — G620 Drug-induced polyneuropathy: Secondary | ICD-10-CM | POA: Diagnosis not present

## 2018-10-21 DIAGNOSIS — E1151 Type 2 diabetes mellitus with diabetic peripheral angiopathy without gangrene: Secondary | ICD-10-CM | POA: Diagnosis not present

## 2018-10-21 DIAGNOSIS — Z433 Encounter for attention to colostomy: Secondary | ICD-10-CM | POA: Diagnosis not present

## 2018-10-21 LAB — CULTURE, BLOOD (ROUTINE X 2)
Culture: NO GROWTH
Culture: NO GROWTH
Special Requests: ADEQUATE
Special Requests: ADEQUATE

## 2018-10-22 ENCOUNTER — Other Ambulatory Visit: Payer: Self-pay

## 2018-10-25 ENCOUNTER — Inpatient Hospital Stay (HOSPITAL_COMMUNITY): Payer: Medicare Other

## 2018-10-25 ENCOUNTER — Other Ambulatory Visit: Payer: Self-pay

## 2018-10-25 ENCOUNTER — Encounter (HOSPITAL_COMMUNITY): Payer: Self-pay

## 2018-10-25 ENCOUNTER — Inpatient Hospital Stay (HOSPITAL_BASED_OUTPATIENT_CLINIC_OR_DEPARTMENT_OTHER): Payer: Medicare Other | Admitting: Hematology

## 2018-10-25 VITALS — BP 117/65 | HR 85 | Temp 98.1°F | Resp 18 | Wt 174.8 lb

## 2018-10-25 DIAGNOSIS — I10 Essential (primary) hypertension: Secondary | ICD-10-CM | POA: Diagnosis not present

## 2018-10-25 DIAGNOSIS — C7802 Secondary malignant neoplasm of left lung: Secondary | ICD-10-CM

## 2018-10-25 DIAGNOSIS — E1151 Type 2 diabetes mellitus with diabetic peripheral angiopathy without gangrene: Secondary | ICD-10-CM | POA: Diagnosis not present

## 2018-10-25 DIAGNOSIS — G893 Neoplasm related pain (acute) (chronic): Secondary | ICD-10-CM

## 2018-10-25 DIAGNOSIS — Z7984 Long term (current) use of oral hypoglycemic drugs: Secondary | ICD-10-CM

## 2018-10-25 DIAGNOSIS — Z79899 Other long term (current) drug therapy: Secondary | ICD-10-CM

## 2018-10-25 DIAGNOSIS — C7801 Secondary malignant neoplasm of right lung: Secondary | ICD-10-CM | POA: Diagnosis not present

## 2018-10-25 DIAGNOSIS — G479 Sleep disorder, unspecified: Secondary | ICD-10-CM | POA: Diagnosis not present

## 2018-10-25 DIAGNOSIS — G629 Polyneuropathy, unspecified: Secondary | ICD-10-CM

## 2018-10-25 DIAGNOSIS — C20 Malignant neoplasm of rectum: Secondary | ICD-10-CM

## 2018-10-25 DIAGNOSIS — Z86718 Personal history of other venous thrombosis and embolism: Secondary | ICD-10-CM | POA: Diagnosis not present

## 2018-10-25 DIAGNOSIS — C7889 Secondary malignant neoplasm of other digestive organs: Secondary | ICD-10-CM

## 2018-10-25 DIAGNOSIS — Z86711 Personal history of pulmonary embolism: Secondary | ICD-10-CM | POA: Diagnosis not present

## 2018-10-25 DIAGNOSIS — E119 Type 2 diabetes mellitus without complications: Secondary | ICD-10-CM

## 2018-10-25 DIAGNOSIS — K9189 Other postprocedural complications and disorders of digestive system: Secondary | ICD-10-CM | POA: Diagnosis not present

## 2018-10-25 DIAGNOSIS — Z5111 Encounter for antineoplastic chemotherapy: Secondary | ICD-10-CM | POA: Diagnosis not present

## 2018-10-25 DIAGNOSIS — G620 Drug-induced polyneuropathy: Secondary | ICD-10-CM | POA: Diagnosis not present

## 2018-10-25 DIAGNOSIS — Z433 Encounter for attention to colostomy: Secondary | ICD-10-CM | POA: Diagnosis not present

## 2018-10-25 DIAGNOSIS — Z933 Colostomy status: Secondary | ICD-10-CM

## 2018-10-25 DIAGNOSIS — T451X5S Adverse effect of antineoplastic and immunosuppressive drugs, sequela: Secondary | ICD-10-CM | POA: Diagnosis not present

## 2018-10-25 DIAGNOSIS — Z7901 Long term (current) use of anticoagulants: Secondary | ICD-10-CM

## 2018-10-25 DIAGNOSIS — C250 Malignant neoplasm of head of pancreas: Secondary | ICD-10-CM | POA: Diagnosis not present

## 2018-10-25 LAB — COMPREHENSIVE METABOLIC PANEL
ALT: 17 U/L (ref 0–44)
AST: 20 U/L (ref 15–41)
Albumin: 3.4 g/dL — ABNORMAL LOW (ref 3.5–5.0)
Alkaline Phosphatase: 84 U/L (ref 38–126)
Anion gap: 11 (ref 5–15)
BUN: 12 mg/dL (ref 8–23)
CO2: 26 mmol/L (ref 22–32)
Calcium: 8.8 mg/dL — ABNORMAL LOW (ref 8.9–10.3)
Chloride: 102 mmol/L (ref 98–111)
Creatinine, Ser: 0.86 mg/dL (ref 0.61–1.24)
GFR calc Af Amer: >60 mL/min (ref >60)
GFR calc non Af Amer: >60 mL/min (ref >60)
Glucose, Bld: 137 mg/dL — ABNORMAL HIGH (ref 70–99)
Potassium: 4 mmol/L (ref 3.5–5.1)
Sodium: 139 mmol/L (ref 135–145)
Total Bilirubin: 0.8 mg/dL (ref 0.3–1.2)
Total Protein: 6.6 g/dL (ref 6.5–8.1)

## 2018-10-25 LAB — CBC WITH DIFFERENTIAL/PLATELET
Abs Immature Granulocytes: 0.05 10*3/uL (ref 0.00–0.07)
Basophils Absolute: 0.1 10*3/uL (ref 0.0–0.1)
Basophils Relative: 1 %
Eosinophils Absolute: 0.2 10*3/uL (ref 0.0–0.5)
Eosinophils Relative: 2 %
HCT: 39.7 % (ref 39.0–52.0)
Hemoglobin: 13 g/dL (ref 13.0–17.0)
Immature Granulocytes: 1 %
Lymphocytes Relative: 27 %
Lymphs Abs: 1.9 10*3/uL (ref 0.7–4.0)
MCH: 31.3 pg (ref 26.0–34.0)
MCHC: 32.7 g/dL (ref 30.0–36.0)
MCV: 95.4 fL (ref 80.0–100.0)
Monocytes Absolute: 0.6 10*3/uL (ref 0.1–1.0)
Monocytes Relative: 9 %
Neutro Abs: 4.1 10*3/uL (ref 1.7–7.7)
Neutrophils Relative %: 60 %
Platelets: 366 10*3/uL (ref 150–400)
RBC: 4.16 MIL/uL — ABNORMAL LOW (ref 4.22–5.81)
RDW: 14.3 % (ref 11.5–15.5)
WBC: 6.9 10*3/uL (ref 4.0–10.5)
nRBC: 0 % (ref 0.0–0.2)

## 2018-10-25 LAB — URINALYSIS, DIPSTICK ONLY
Bilirubin Urine: NEGATIVE
Glucose, UA: NEGATIVE mg/dL
Hgb urine dipstick: NEGATIVE
Ketones, ur: NEGATIVE mg/dL
Leukocytes,Ua: NEGATIVE
Nitrite: NEGATIVE
Protein, ur: 30 mg/dL — AB
Specific Gravity, Urine: 1.019 (ref 1.005–1.030)
pH: 5 (ref 5.0–8.0)

## 2018-10-25 MED ORDER — SODIUM CHLORIDE 0.9 % IV SOLN
Freq: Once | INTRAVENOUS | Status: AC
  Administered 2018-10-25: 11:00:00 via INTRAVENOUS

## 2018-10-25 MED ORDER — SODIUM CHLORIDE 0.9 % IV SOLN
327.0000 mg/m2 | Freq: Once | INTRAVENOUS | Status: AC
  Administered 2018-10-25: 700 mg via INTRAVENOUS
  Filled 2018-10-25: qty 35

## 2018-10-25 MED ORDER — SODIUM CHLORIDE 0.9 % IV SOLN
1920.0000 mg/m2 | INTRAVENOUS | Status: DC
  Administered 2018-10-25: 13:00:00 4100 mg via INTRAVENOUS
  Filled 2018-10-25: qty 82

## 2018-10-25 MED ORDER — FLUOROURACIL CHEMO INJECTION 2.5 GM/50ML
320.0000 mg/m2 | Freq: Once | INTRAVENOUS | Status: AC
  Administered 2018-10-25: 700 mg via INTRAVENOUS
  Filled 2018-10-25: qty 14

## 2018-10-25 MED ORDER — PALONOSETRON HCL INJECTION 0.25 MG/5ML
INTRAVENOUS | Status: AC
  Filled 2018-10-25: qty 5

## 2018-10-25 MED ORDER — SODIUM CHLORIDE 0.9% FLUSH
10.0000 mL | INTRAVENOUS | Status: DC | PRN
  Administered 2018-10-25: 10 mL
  Filled 2018-10-25: qty 10

## 2018-10-25 MED ORDER — ATROPINE SULFATE 1 MG/ML IJ SOLN
0.5000 mg | Freq: Once | INTRAMUSCULAR | Status: AC | PRN
  Administered 2018-10-25: 0.5 mg via INTRAVENOUS

## 2018-10-25 MED ORDER — ATROPINE SULFATE 1 MG/ML IJ SOLN
INTRAMUSCULAR | Status: AC
  Filled 2018-10-25: qty 1

## 2018-10-25 MED ORDER — SODIUM CHLORIDE 0.9 % IV SOLN
10.0000 mg | Freq: Once | INTRAVENOUS | Status: AC
  Administered 2018-10-25: 10 mg via INTRAVENOUS
  Filled 2018-10-25: qty 10

## 2018-10-25 MED ORDER — SODIUM CHLORIDE 0.9 % IV SOLN
144.0000 mg/m2 | Freq: Once | INTRAVENOUS | Status: AC
  Administered 2018-10-25: 300 mg via INTRAVENOUS
  Filled 2018-10-25: qty 15

## 2018-10-25 MED ORDER — PALONOSETRON HCL INJECTION 0.25 MG/5ML
0.2500 mg | Freq: Once | INTRAVENOUS | Status: AC
  Administered 2018-10-25: 0.25 mg via INTRAVENOUS

## 2018-10-25 MED ORDER — SODIUM CHLORIDE 0.9 % IV SOLN
Freq: Once | INTRAVENOUS | Status: AC
  Administered 2018-10-25: 09:00:00 via INTRAVENOUS

## 2018-10-25 NOTE — Progress Notes (Signed)
[facility] reviewed with and pt seen by Reynolds Bowl NP and pt approved for chemo tx today with a chemo dosage decrease per NP                                                                      [name] tolerated chemo tx well without complaints or incident. Pt discharged with 5FU pump infusing without issues. VSS upon discharge. Pt discharged via wheelchair in satisfactory condition

## 2018-10-25 NOTE — Progress Notes (Signed)
10/25/18  Received secure message - read back twice - to decrease chemotherapy doses by 20% - irinotecan, leucovorin, and fluorouracil doses.  No Avastin today as not 28 days post colostomy procedure.  Plan updated in Epic and noted with decreases and hold.  Heart rate: 103 noted and ok to proceed.   T.O. Dr Sondra Barges, NP/[facility] Ronnald Ramp, PharmD

## 2018-10-25 NOTE — Assessment & Plan Note (Signed)
1.  Stage IV rectal cancer with lung and pancreatic metastasis: -12 cycles of FOLFOX in the adjuvant setting from October 2012 through 07/14/2011. -15 cycles of FOLFOX and bevacizumab from 07/08/2016 through 01/20/2017, followed by infusional 5-FU and bevacizumab until 07/14/2017 -Maintenance Xeloda started on 08/07/2017 with bevacizumab, dose reduced to 1500 mg twice daily during cycle 2.  Prior to that he was on 5-FU maintenance. -CT CAP on 07/30/2018 showed no significant progressive metastatic disease.  No significant change in the right upper lobe solid pulmonary nodules.  Stable mixed lytic and sclerotic lower sternal lesion.  Stable malignant pancreatic head mass. -MRCP showed intrahepatic and extrahepatic biliary dilatation with stricture at common bile duct.  Mass in the head of the pancreas has increased in size to 2.1 cm, previously 1.7 cm. -ERCP and stent placement on 09/16/2018, showing a single localized biliary stricture.  Biliary sphincterotomy was also performed and stricture dilated to 6 mm with the balloon to facilitate stent placement.  1 covered metal stent was placed. -Flexible sigmoidoscopy on 09/24/2018 shows narrowing of the colorectal anastomosis. - Transverse loop colostomy on 09/29/2018. -Cycle 1 of FOLFIRI on 10/04/2018.   -He was admitted from 10/16/2018 through 10/17/2018 for nausea and dry heaves.  He was emptying his bag 2 times a day but it was more liquidy.  He normally empties the bag twice a day.  No cramping reported.  He felt very tired after Cycle 1.  -Regimen dose reduced by 20%. Neulasta added  to the regimen.  - Labs are acceptable today to proceed with Cycle 2 today. Continue to hold bevacizumab until 28 days post- op. Plan to add to Cycle 3.   2.  Peripheral neuropathy: -We will continue gabapentin 900 mg twice daily.   3.  DVT and PE: -Diagnosed in 2016, status post IVC filter. -Xarelto was temporarily held.  He started back on it.  4.  Rectal pain: -He will  continue hydrocodone as needed.  5.  Sleep problems: -Trazodone 100 mg tablet at bedtime is helping.  6.  Decreased appetite: -Currently on Megace 5 mg twice daily.

## 2018-10-25 NOTE — Progress Notes (Signed)
Charles [name], [place] [id number]   CLINIC:  Medical Oncology/Hematology  PCP:  [name], MD [place] [address] [phone]   REASON FOR VISIT:  Follow-up for Stage IV rectal cancer with lung and pancreatic metastasis  CURRENT THERAPY: FOLFIRI  BRIEF ONCOLOGIC HISTORY:  Oncology History  Rectal cancer ([place])  07/24/2010 Initial Diagnosis   Invasive adenocarcinoma of rectum   09/09/2010 Concurrent Chemotherapy   S/P radiation and concurrent 5-FU continuous infusion from 09/09/10- 10/10/10.   11/14/2010 Surgery   S/P proctectomy with colorectal anastomosis and diverting loop ileostomy on 11/14/10 at [facility] by Dr. Harlon Ditty. Pathology reveals a pT3b N1 with 3/20 lymph nodes.   02/05/2011 - 07/14/2011 Chemotherapy   FOLFOX   08/18/2011 Surgery   Approximate date of surgery- [place] by Dr. Harlon Ditty    Remission     11/24/2013 Survivorship   Colonoscopy- somewhat fibrotic/friable anastomotic mucosal-status post biopsy (narrowing not felt to be clinically significant).  Negative pathology for malignancy   09/12/2014 Imaging   DVT in the left femoral venous system, left common iliac vein, IVC, and within the IVC filter Right lower extremity venography confirms chronic occlusion of the femoral venous system with collateralization. The right iliac venous system is patent and do   09/25/2014 PET scan   The right middle lobe pulmonary nodule is hypermetabolic, favored to represent a primary bronchogenic carcinoma.Equivocal mediastinal nodes, similar to surrounding blood pool. Bilateral adrenal hypermetabolism, felt to be physiologic   10/24/2014 Pathology Results   Lung, needle/core biopsy(ies), RML - ADENOCARCINOMA, SEE COMMENT metastatic adenocarcinoma of a colorectal primary   10/24/2014 Relapse/Recurrence     11/16/2014 Definitive Surgery   Bronchoscopy, right video-assisted thoracoscopy, wedge resection of right middle lobe  by Dr. Servando Snare   11/16/2014 Pathology Results   Lung, wedge biopsy/resection, right lingula and small portion of middle lobe - METASTATIC ADENOCARCINOMA, CONSISTENT WITH COLORECTAL PRIMARY, SPANNING 2.0 CM. - THE SURGICAL RESECTION MARGINS ARE NEGATIVE FOR ADENOCARCINOMA.   11/16/2014 Remission   THE SURGICAL RESECTION MARGINS ARE NEGATIVE FOR ADENOCARCINOMA.   03/07/2015 Imaging   CT CAP- Interval resection of right middle lobe metastasis. No acute process or evidence of metastatic disease in the chest, abdomen or pelvis. Improved right upper lobe reticular nodular opacities are favored to represent resolving infection.   09/14/2015 Imaging   CT CAP- No findings of recurrent malignancy. No recurrence along the wedge resection site of the right middle lobe. Right anterior abdominal wall focal hernia containing a knuckle of small bowel without complicating feature.   06/13/2016 Imaging   CT CAP- 1. New right-sided pleural metastasis/mass. Other smaller right-sided pulmonary nodules which are all pleural-based and most likely represent pleural metastasis. 2. No convincing evidence of abdominopelvic nodal metastasis. 3. Constellation of findings, including pancreatic atrophy, duct dilatation, and pancreatic head soft tissue fullness which are highly suspicious for pancreatic adenocarcinoma. Metastatic disease felt much less likely. Consider endoscopic ultrasound sampling or ERCP. Cannot exclude superimposed acute pancreatitis. 4. New enlargement of the appendiceal tip with subtle surrounding edema. Cannot exclude early or mild appendicitis. 5. Coronary artery atherosclerosis. Aortic atherosclerosis. 6. 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   06/13/2016 Progression   CT scan demonstrates progression of disease   06/19/2016 Procedure   EUS with FNA by Dr. Ardis Hughs   06/20/2016 Pathology Results   FINE NEEDLE ASPIRATION, ENDOSCOPIC, PANCREAS UNCINATE AREA(SPECIMEN 1  OF 1 COLLECTED 06/19/16): MALIGNANT CELLS CONSISTENT  WITH METASTATIC ADENOCARCINOMA.   06/25/2016 PET scan   1. Two pleural-based nodules in the right hemithorax are hypermetabolic. Metastatic disease is a distinct consideration. The scattered pulmonary parenchymal nodule seen on previous diagnostic CT imaging are below the threshold for reliable resolution on PET imaging. 2. Hypermetabolic lesion pancreatic head, consistent with neoplasm. As noted on prior CT, adenocarcinoma is any consideration. No evidence for hypermetabolic abdominal lymphadenopathy. Mottled uptake noted in the liver, but no discrete hepatic metastases are evident on PET imaging. 3. Appendix remains distended up to 0.9-10 mm diameter with a stone towards the tip in subtle periappendiceal edema/inflammation. The appendix is hypermetabolic along its length. Imaging features are relatively stable in the 12 day interval since prior CT scan. While appendicitis is a consideration, the relative stability over 12 days would be unusual for that etiology. Continued close follow-up recommended.   06/26/2016 Pathology Results   Not enough tissue for foundationONE or K-ras testing.   07/07/2016 Procedure   Port placed by Dr. Arnoldo Morale   07/08/2016 -  Chemotherapy   The patient had pegfilgrastim (NEULASTA) injection 6 mg, 6 mg, Subcutaneous,  Once, 0 of 4 cycles  ondansetron (ZOFRAN) IVPB 8 mg, 8 mg, Intravenous,  Once, 0 of 4 cycles  leucovorin 804 mg in dextrose 5 % 250 mL infusion, 400 mg/m2, Intravenous,  Once, 0 of 4 cycles  oxaliplatin (ELOXATIN) 170 mg in dextrose 5 % 500 mL chemo infusion, 85 mg/m2, Intravenous,  Once, 0 of 4 cycles  fluorouracil (ADRUCIL) 4,800 mg in sodium chloride 0.9 % 150 mL chemo infusion, 2,400 mg/m2 = 4,800 mg, Intravenous, 1 Day/Dose, 0 of 4 cycles  fluorouracil (ADRUCIL) chemo injection 800 mg, 400 mg/m2, Intravenous,  Once, 0 of 4 cycles  pegfilgrastim (NEULASTA) injection 6 mg, 6 mg,  Subcutaneous,  Once, 2 of 2 cycles  ondansetron (ZOFRAN) IVPB 8 mg, 8 mg, Intravenous,  Once, 12 of 12 cycles  leucovorin 660 mg in dextrose 5 % 250 mL infusion, 660 mg (original dose ), Intravenous,  Once, 12 of 12 cycles Dose modification: 660 mg (Cycle 1)  oxaliplatin (ELOXATIN) 140 mg in dextrose 5 % 500 mL chemo infusion, 140 mg (original dose ), Intravenous,  Once, 12 of 12 cycles Dose modification: 140 mg (Cycle 1), 119 mg (85 % of original dose 140 mg, Cycle 8, Reason: Provider Judgment)  fluorouracil (ADRUCIL) 3,850 mg in sodium chloride 0.9 % 150 mL chemo infusion, 3,850 mg (original dose ), Intravenous, 1 Day/Dose, 12 of 12 cycles Dose modification: 3,850 mg (Cycle 1)  fluorouracil (ADRUCIL) chemo injection 700 mg, 700 mg (original dose ), Intravenous,  Once, 12 of 12 cycles Dose modification: 700 mg (Cycle 1)  palonosetron (ALOXI) injection 0.25 mg, 0.25 mg, Intravenous,  Once, 7 of 12 cycles Administration: 0.25 mg (09/30/2016)  bevacizumab (AVASTIN) 475 mg in sodium chloride 0.9 % 100 mL chemo infusion, 5 mg/kg = 475 mg, Intravenous,  Once, 6 of 11 cycles Administration: 500 mg (09/30/2016)  leucovorin 880 mg in dextrose 5 % 250 mL infusion, 400 mg/m2 = 880 mg, Intravenous,  Once, 7 of 12 cycles Administration: 880 mg (09/30/2016)  oxaliplatin (ELOXATIN) 185 mg in dextrose 5 % 500 mL chemo infusion, 85 mg/m2 = 185 mg, Intravenous,  Once, 7 of 12 cycles Administration: 185 mg (09/30/2016)  fluorouracil (ADRUCIL) chemo injection 900 mg, 400 mg/m2 = 900 mg, Intravenous,  Once, 7 of 12 cycles Administration: 900 mg (09/30/2016)  fluorouracil (ADRUCIL) 5,300 mg in sodium chloride 0.9 % 144 mL chemo  infusion, 2,400 mg/m2 = 5,300 mg, Intravenous, 1 Day/Dose, 7 of 12 cycles Administration: 5,300 mg (09/30/2016)  for chemotherapy treatment.     08/27/2016 Imaging   CT CAP- 1. Pulmonary metastatic disease is stable. 2. Pancreatic head mass, grossly stable, with progressive atrophy  of the body and tail of the pancreas. Stable peripancreatic lymph node. 3. Mild circumferential rectal wall thickening, stable. 4. Aortic atherosclerosis (ICD10-170.0). Coronary artery calcification. 5. Hyperattenuating lesion off the lower pole left kidney, stable, too small to characterize. Continued attention on followup exams is warranted. 6. Persistent wall thickening and mild dilatation of the proximal appendix, of uncertain etiology. Continued attention on followup exams is warranted.   11/06/2016 Imaging   CT C/A/P: IMPRESSION: 1. Stable pulmonary metastatic disease. 2. Similar appearing pancreatic head mass. Progressed atrophy of the pancreatic body and tail. Stable peripancreatic lymph node. 3. Stable hypoattenuating lesion partially exophytic off the inferior pole of the left kidney. 4. Aortic atherosclerosis. 5. Persistent mild wall thickening and mild dilatation of the appendix.    01/30/2017 Imaging   Ct C/A/P: IMPRESSION: 1. Stable pulmonary metastatic disease. 2. Stable to slight increase in size of pancreatic head mass. 3.  Aortic Atherosclerosis (ICD10-I70.0). 4. Similar appearance of mild wall thickening of the appendix which contains an appendicolith.   05/01/2017 Imaging   CT C/A/P: Stable disease in the lungs, no progression of disease in the abdomen.   10/04/2018 -  Chemotherapy   The patient had palonosetron (ALOXI) injection 0.25 mg, 0.25 mg, Intravenous,  Once, 2 of 4 cycles Administration: 0.25 mg (10/04/2018) pegfilgrastim (NEULASTA) injection 6 mg, 6 mg, Subcutaneous, Once, 1 of 3 cycles bevacizumab (AVASTIN) 450 mg in sodium chloride 0.9 % 100 mL chemo infusion, 5 mg/kg = 450 mg, Intravenous,  Once, 1 of 3 cycles irinotecan (CAMPTOSAR) 380 mg in sodium chloride 0.9 % 500 mL chemo infusion, 180 mg/m2 = 380 mg, Intravenous,  Once, 2 of 4 cycles Dose modification: 144 mg/m2 (original dose 180 mg/m2, Cycle 2, Reason: Provider Judgment, Comment: 20% dose  reduction) Administration: 380 mg (10/04/2018) leucovorin 900 mg in sodium chloride 0.9 % 250 mL infusion, 421 mg/m2 = 856 mg, Intravenous,  Once, 2 of 4 cycles Dose modification: 320 mg/m2 (original dose 400 mg/m2, Cycle 2, Reason: Provider Judgment, Comment: 20% dose reduction) Administration: 900 mg (10/04/2018) fluorouracil (ADRUCIL) chemo injection 850 mg, 400 mg/m2 = 850 mg, Intravenous,  Once, 2 of 4 cycles Dose modification: 320 mg/m2 (original dose 400 mg/m2, Cycle 2, Reason: Provider Judgment, Comment: 20% dose reduction) Administration: 850 mg (10/04/2018) fluorouracil (ADRUCIL) 5,000 mg in sodium chloride 0.9 % 150 mL chemo infusion, 2,325 mg/m2 = 5,150 mg, Intravenous, 1 Day/Dose, 2 of 4 cycles Dose modification: 1,920 mg/m2 (original dose 2,400 mg/m2, Cycle 2, Reason: Provider Judgment, Comment: 20% dose reduction) Administration: 5,000 mg (10/04/2018)  for chemotherapy treatment.       CANCER STAGING: Cancer Staging Rectal cancer ([place]) Staging form: Colon and Rectum, AJCC 7th Edition - Clinical: Stage IIIB (T3, N1, M0) - Signed by [name], MD on 02/04/2011 - Pathologic stage from 11/17/2014: Stage IVA (M1a) - Signed by Baird Cancer, PA-C on 09/14/2015    INTERVAL HISTORY:  Mr. Mestre 68 y.o. male presents today for follow up. Reports overall doing fair.  He denies any further fevers. Denies any signs/symptoms of infection. Reports appetite is diminished but stable. Recently started on Marinol, states he can not tell if it is improving his appetite at this time.  Repots diminished physical  activity.  Reports 2 bowel movements per day. No watery or loose. No blood appreciated. Reports nausea that resolves without intervention. Reports peripheral neuropathy that is unchanged from baseline. States he is ready to proceed with treatment today.    REVIEW OF SYSTEMS:  Review of Systems  Constitutional: Positive for appetite change and fatigue.  HENT:  Negative.   Eyes:  Negative.   Respiratory: Negative.   Cardiovascular: Negative.   Gastrointestinal: Positive for nausea.  Endocrine: Negative.   Genitourinary: Negative.    Musculoskeletal: Positive for myalgias.  Skin: Negative.   Neurological: Positive for numbness.  Hematological: Negative.   Psychiatric/Behavioral: Negative.      PAST MEDICAL/SURGICAL HISTORY:  Past Medical History:  Diagnosis Date  . Chronic anticoagulation   . Colon cancer ([place]) 07/24/2010   rectal ca, inv adenocarcinoma  . Depression 04/01/2011  . Diabetes mellitus without complication ([place])   . DVT (deep venous thrombosis) ([place]) 05/09/2011  . GERD (gastroesophageal reflux disease)   . High output ileostomy ([place]) 05/21/2011  . History of kidney stones   . HTN (hypertension)   . Hx of radiation therapy 09/02/10 to 10/14/10   pelvis  . Lung metastasis ([place])   . Neuropathy   . Peripheral vascular disease ([place])    dvt's,pe  . Pneumonia    hx x3  . Pulmonary embolism ([place])   . Rectal cancer ([place]) 01/15/2011   S/P radiation and concurrent 5-FU continuous infusion from 09/09/10- 10/10/10.  S/P proctectomy with colorectal anastomosis and diverting loop ileostomy on 11/14/10 at [facility] by Dr. Harlon Ditty. Pathology reveals a pT3b N1 with 3/20 lymph nodes.     Past Surgical History:  Procedure Laterality Date  . BILIARY STENT PLACEMENT N/A 09/16/2018   Procedure: STENT PLACEMENT;  Surgeon: Rogene Houston, MD;  [facility];  Service: Endoscopy;  Laterality: N/A;  . BIOPSY  09/24/2018   Procedure: BIOPSY;  Surgeon: Danie Binder, MD;  Location: [facility];  Service: Endoscopy;;  . COLON SURGERY  11/14/2010   proctectomy with colorectal anastomosis and diverting loop ileostomy (temporary planned)  . COLONOSCOPY  07/2010   proximal rectal apple core mass 10-14cm from anal verge (adenocarcinoma), 2-3cm distal rectal carpet polyp s/p piecemeal snare polypectomy (adenoma)  . COLONOSCOPY  04/21/2012   RMR:  Friable,fibrotic appearing colorectal anastomosis producing some luminal narrowing-not felt to be critical. path: focal erosion with slight inflammation and hyperemia. SURVEILLANCE DUE DEC 2015  . COLONOSCOPY N/A 11/24/2013   Dr. Rourk:somewhat fibrotic/friable anastomotic mucosal-status post biopsy (narrowing not felt to be clinically significant) Single colonic diverticulum. benign polypoid rectal mucosa  . colostomy reversal  april 2013  . ERCP N/A 09/16/2018   Procedure: ENDOSCOPIC RETROGRADE CHOLANGIOPANCREATOGRAPHY (ERCP);  Surgeon: Rogene Houston, MD;  [facility];  Service: Endoscopy;  Laterality: N/A;  . ESOPHAGOGASTRODUODENOSCOPY  07/2010   RMR: schatki ring s/p dilation, small hh, SB bx benign  . ESOPHAGOGASTRODUODENOSCOPY N/A 09/04/2015   Procedure: ESOPHAGOGASTRODUODENOSCOPY (EGD);  Surgeon: Daneil Dolin, MD;  [facility];  Service: Endoscopy;  Laterality: N/A;  730  . EUS  08/2010   Dr. Owens Loffler. uT3N0 circumferential, nearly obstruction rectosigmoid adenocarcinoma, distal edge 12cm from anal verge  . EUS N/A 06/19/2016   Procedure: UPPER ENDOSCOPIC ULTRASOUND (EUS) RADIAL;  Surgeon: Milus Banister, MD;  Location: [facility];  Service: Endoscopy;  Laterality: N/A;  . FLEXIBLE SIGMOIDOSCOPY N/A 09/24/2018   Procedure: FLEXIBLE SIGMOIDOSCOPY;  Surgeon: Danie Binder, MD;  [facility]  [facility];  Service: Endoscopy;  Laterality: N/A;  . HERNIA REPAIR     abd hernia repair  . IVC filter    . ivc filter    . port a cath placement    . PORT-A-CATH REMOVAL  09/24/2011   Procedure: REMOVAL PORT-A-CATH;  Surgeon: Donato Heinz, MD;  Location: [facility] OR[facility];  Service: General;  Laterality: N/A;  Minor Room  . PORTACATH PLACEMENT Right 07/07/2016   Procedure: INSERTION PORT-A-CATH;  Surgeon: Aviva Signs, MD;  Location: [facility];  Service: General;  Laterality: Right;  . SPHINCTEROTOMY N/A 09/16/2018   Procedure: SPHINCTEROTOMY with balloon dialation;  Surgeon:  Rogene Houston, MD;  [facility];  Service: Endoscopy;  Laterality: N/A;  . TRANSVERSE LOOP COLOSTOMY N/A 09/29/2018   Procedure: TRANSVERSE LOOP COLOSTOMY;  Surgeon: Aviva Signs, MD;  Location: [facility];  Service: General;  Laterality: N/A;  . VIDEO ASSISTED THORACOSCOPY (VATS)/WEDGE RESECTION Right 11/15/2014   Procedure: VIDEO ASSISTED THORACOSCOPY (VATS)/LUNG RESECTION WITH RIGHT LINGULECTOMY;  Surgeon: [name], MD;  Location: [facility];  Service: Thoracic;  Laterality: Right;  Marland Kitchen VIDEO BRONCHOSCOPY N/A 11/15/2014   Procedure: VIDEO BRONCHOSCOPY;  Surgeon: [name], MD;  Location: [facility] OR;  Service: Thoracic;  Laterality: N/A;     SOCIAL HISTORY:  Social History   Socioeconomic History  . Marital status: Married    Spouse name: Not on file  . Number of children: 2  . Years of education: Not on file  . Highest education level: Not on file  Occupational History  . Occupation: self-employed Regulatory affairs officer, currently not working    Fish farm manager: SELF EMPLOYED  Social Needs  . Financial resource strain: Not very hard  . Food insecurity    Worry: Never true    Inability: Never true  . Transportation needs    Medical: No    Non-medical: No  Tobacco Use  . Smoking status: Never Smoker  . Smokeless tobacco: Never Used  Substance and Sexual Activity  . Alcohol use: No  . Drug use: No  . Sexual activity: Yes    Birth control/protection: None    Comment: married  Lifestyle  . Physical activity    Days per week: 0 days    Minutes per session: 0 min  . Stress: Only a little  Relationships  . Social connections    Talks on phone: More than three times a week    Gets together: Once a week    Attends religious service: Patient refused    Active member of club or organization: Patient refused    Attends meetings of clubs or organizations: Patient refused    Relationship status: Patient refused  . Intimate partner violence    Fear of current or  ex partner: Patient refused    Emotionally abused: Patient refused    Physically abused: Patient refused    Forced sexual activity: Patient refused  Other Topics Concern  . Not on file  Social History Narrative  . Not on file    FAMILY HISTORY:  Family History  Problem Relation Age of Onset  . Cancer Brother        throat  . Cancer Brother        prostate  . Colon cancer Neg Hx   . Liver disease Neg Hx   . Inflammatory bowel disease Neg Hx     CURRENT MEDICATIONS:  Outpatient Encounter Medications as of 10/25/2018  Medication Sig  . acetaminophen (TYLENOL) 500 MG tablet  Take 1,000 mg by mouth every 6 (six) hours as needed for mild pain or moderate pain.   . clonazePAM (KLONOPIN) 0.5 MG tablet Take 1 tablet (0.5 mg total) by mouth 2 (two) times daily as needed.  . Diphenhyd-Hydrocort-Nystatin (FIRST-DUKES MOUTHWASH) SUSP Use as directed 5 mLs in the mouth or throat 4 (four) times daily.  . diphenoxylate-atropine (LOMOTIL) 2.5-0.025 MG tablet Take 1 tablet by mouth 4 (four) times daily as needed for diarrhea or loose stools.  Marland Kitchen dronabinol (MARINOL) 5 MG capsule Take 1 capsule (5 mg total) by mouth 2 (two) times daily before a meal.  . gabapentin (NEURONTIN) 300 MG capsule Take 3 capsules (900 mg total) by mouth 2 (two) times daily.  Marland Kitchen glimepiride (AMARYL) 2 MG tablet Take 2 mg by mouth daily with breakfast.   . mirtazapine (REMERON) 7.5 MG tablet Take 1 tablet (7.5 mg total) by mouth at bedtime.  Marland Kitchen omeprazole (PRILOSEC) 20 MG capsule Take 20 mg by mouth 2 (two) times daily before a meal.   . ondansetron (ZOFRAN) 8 MG tablet Take 1 tablet (8 mg total) by mouth 2 (two) times daily as needed for refractory nausea / vomiting. Start on day 3 after chemotherapy.  Marland Kitchen oxyCODONE-acetaminophen (PERCOCET) 10-325 MG tablet Take 1 tablet by mouth every 6 (six) hours as needed for pain.  . Probiotic Product (PROBIOTIC DAILY PO) Take 1 capsule by mouth daily.  . prochlorperazine (COMPAZINE) 10 MG  tablet Take 1 tablet (10 mg total) by mouth every 6 (six) hours as needed for nausea or vomiting.  . promethazine (PHENERGAN) 25 MG tablet Take 1 tablet (25 mg total) by mouth every 8 (eight) hours as needed for refractory nausea / vomiting.  . rivaroxaban (XARELTO) 20 MG TABS tablet Take 1 tablet (20 mg total) by mouth daily with supper.  . traZODone (DESYREL) 100 MG tablet Take 1 tablet (100 mg total) by mouth at bedtime.  . vitamin B-12 (CYANOCOBALAMIN) 1000 MCG tablet Take 1,000 mcg by mouth daily.   Facility-Administered Encounter Medications as of 10/25/2018  Medication  . [COMPLETED] 0.9 %  sodium chloride infusion  . 0.9 %  sodium chloride infusion  . atropine injection 0.5 mg  . dexamethasone (DECADRON) 10 mg in sodium chloride 0.9 % 50 mL IVPB  . fluorouracil (ADRUCIL) 4,100 mg in sodium chloride 0.9 % 68 mL chemo infusion  . fluorouracil (ADRUCIL) chemo injection 700 mg  . irinotecan (CAMPTOSAR) 300 mg in sodium chloride 0.9 % 500 mL chemo infusion  . leucovorin 700 mg in sodium chloride 0.9 % 250 mL infusion  . [COMPLETED] palonosetron (ALOXI) injection 0.25 mg  . sodium chloride 0.9 % injection 10 mL  . sodium chloride flush (NS) 0.9 % injection 10 mL  . sodium chloride flush (NS) 0.9 % injection 10 mL  . palonosetron (ALOXI) 0.25 MG/5ML injection  . atropine 1 MG/ML injection    ALLERGIES:  Allergies  Allergen Reactions  . Oxycodone     Blisters, hallucinations  . Tramadol     Blisters, hallucinations  . Trazodone And Nefazodone Other (See Comments)    hallucinations     PHYSICAL EXAM:  ECOG Performance status: 3  There were no vitals filed for this visit. There were no vitals filed for this visit.  Physical Exam Constitutional:      Appearance: Normal appearance.  HENT:     Nose: Nose normal.     Mouth/Throat:     Mouth: Mucous membranes are moist.     Pharynx:  Oropharynx is clear.  Eyes:     Extraocular Movements: Extraocular movements intact.      Conjunctiva/sclera: Conjunctivae normal.  Neck:     Musculoskeletal: Normal range of motion.  Cardiovascular:     Rate and Rhythm: Normal rate and regular rhythm.     Pulses: Normal pulses.     Heart sounds: Normal heart sounds.  Pulmonary:     Effort: Pulmonary effort is normal.     Breath sounds: Normal breath sounds.  Abdominal:     General: Bowel sounds are normal.     Palpations: Abdomen is soft.  Musculoskeletal: Normal range of motion.  Skin:    General: Skin is warm and dry.  Neurological:     Mental Status: He is alert and oriented to person, place, and time. Mental status is at baseline.  Psychiatric:        Mood and Affect: Mood normal.        Behavior: Behavior normal.        Thought Content: Thought content normal.        Judgment: Judgment normal.      LABORATORY DATA:  I have reviewed the labs as listed.  CBC    Component Value Date/Time   WBC 6.9 10/25/2018 0807   RBC 4.16 (L) 10/25/2018 0807   HGB 13.0 10/25/2018 0807   HGB 11.6 (L) 02/04/2011 1059   HCT 39.7 10/25/2018 0807   HCT 34.7 (L) 02/04/2011 1059   PLT 366 10/25/2018 0807   PLT 282 02/04/2011 1059   MCV 95.4 10/25/2018 0807   MCV 76.0 (L) 02/04/2011 1059   MCH 31.3 10/25/2018 0807   MCHC 32.7 10/25/2018 0807   RDW 14.3 10/25/2018 0807   RDW 18.5 (H) 02/04/2011 1059   LYMPHSABS 1.9 10/25/2018 0807   LYMPHSABS 1.1 02/04/2011 1059   MONOABS 0.6 10/25/2018 0807   MONOABS 0.5 02/04/2011 1059   EOSABS 0.2 10/25/2018 0807   EOSABS 0.1 02/04/2011 1059   BASOSABS 0.1 10/25/2018 0807   BASOSABS 0.1 02/04/2011 1059   CMP Latest Ref Rng & Units 10/25/2018 10/18/2018 10/17/2018  Glucose 70 - 99 mg/dL 137(H) 160(H) 97  BUN 8 - 23 mg/dL _0 Creatinine 0.61 - 1.24 mg/dL 0.86 0.76 0.74  Sodium 135 - 145 mmol/L 139 140 137  Potassium 3.5 - 5.1 mmol/L 4.0 4.0 4.1  Chloride 98 - 111 mmol/L 102 105 103  CO2 22 - 32 mmol/L _1 Calcium 8.9 - 10.3 mg/dL 8.8(L) 8.9 8.1(L)  Total Protein 6.5 -  8.1 g/dL 6.6 6.1(L) -  Total Bilirubin 0.3 - 1.2 mg/dL 0.8 1.1 -  Alkaline Phos 38 - 126 U/L 84 72 -  AST 15 - 41 U/L 20 17 -  ALT 0 - 44 U/L 17 18 -     ASSESSMENT & PLAN:   Rectal cancer 1.  Stage IV rectal cancer with lung and pancreatic metastasis: -12 cycles of FOLFOX in the adjuvant setting from October 2012 through 07/14/2011. -15 cycles of FOLFOX and bevacizumab from 07/08/2016 through 01/20/2017, followed by infusional 5-FU and bevacizumab until 07/14/2017 -Maintenance Xeloda started on 08/07/2017 with bevacizumab, dose reduced to 1500 mg twice daily during cycle 2.  Prior to that he was on 5-FU maintenance. -CT CAP on 07/30/2018 showed no significant progressive metastatic disease.  No significant change in the right upper lobe solid pulmonary nodules.  Stable mixed lytic and sclerotic lower sternal lesion.  Stable malignant pancreatic head mass. -MRCP showed intrahepatic and  extrahepatic biliary dilatation with stricture at common bile duct.  Mass in the head of the pancreas has increased in size to 2.1 cm, previously 1.7 cm. -ERCP and stent placement on 09/16/2018, showing a single localized biliary stricture.  Biliary sphincterotomy was also performed and stricture dilated to 6 mm with the balloon to facilitate stent placement.  1 covered metal stent was placed. -Flexible sigmoidoscopy on 09/24/2018 shows narrowing of the colorectal anastomosis. - Transverse loop colostomy on 09/29/2018. -Cycle 1 of FOLFIRI on 10/04/2018.   -He was admitted from 10/16/2018 through 10/17/2018 for nausea and dry heaves.  He was emptying his bag 2 times a day but it was more liquidy.  He normally empties the bag twice a day.  No cramping reported.  He felt very tired after Cycle 1.  -Regimen dose reduced by 20%. Neulasta added  to the regimen.  - Labs are acceptable today to proceed with Cycle 2 today. Continue to hold bevacizumab until 28 days post- op. Plan to add to Cycle 3.   2.  Peripheral neuropathy: -We  will continue gabapentin 900 mg twice daily.   3.  DVT and PE: -Diagnosed in 2016, status post IVC filter. -Xarelto was temporarily held.  He started back on it.  4.  Rectal pain: -He will continue hydrocodone as needed.  5.  Sleep problems: -Trazodone 100 mg tablet at bedtime is helping.  6.  Decreased appetite: -Currently on Megace 5 mg twice daily.        Orders placed this encounter:  Orders Placed This Encounter  Procedures  . CBC with Differential  . Comprehensive metabolic panel  . Magnesium    Roger Shelter, Grandfather [phone]

## 2018-10-25 NOTE — Patient Instructions (Signed)
[place] at [facility] Discharge Instructions  Labs drawn fro portacath today   Thank you for choosing [place] at [facility] to provide your oncology and hematology care.  To afford each patient quality time with our provider, please arrive at least 15 minutes before your scheduled appointment time.   If you have a lab appointment with the [facility] please come in thru the  Main Entrance and check in at the main information desk  You need to re-schedule your appointment should you arrive 10 or more minutes late.  We strive to give you quality time with our providers, and arriving late affects you and other patients whose appointments are after yours.  Also, if you no show three or more times for appointments you may be dismissed from the clinic at the providers discretion.     Again, thank you for choosing [facility].  Our hope is that these requests will decrease the amount of time that you wait before being seen by our physicians.       _____________________________________________________________  Should you have questions after your visit to [facility] please contact our office at [phone] between the hours of 8:00 a.m. and 4:30 p.m.  Voicemails left after 4:00 p.m. will not be returned until the following business day.  For prescription refill requests, have your pharmacy contact our office and allow 72 hours.    Cancer Center Support Programs:   > Cancer Support Group  2nd Tuesday of the month 1pm-2pm, Journey Room

## 2018-10-25 NOTE — Patient Instructions (Signed)
[facility] Discharge Instructions for Patients Receiving Chemotherapy   Beginning January 23rd 2017 lab work for the [facility] will be done in the  Main lab at [facility] on 1st floor. If you have a lab appointment with the [facility] please come in thru the  Main Entrance and check in at the main information desk   Today you received the following chemotherapy agents Irinotecan,Leucovorin and 5FU. Follow-up as scheduled. Call clinic for any questions or concerns  To help prevent nausea and vomiting after your treatment, we encourage you to take your nausea medication   If you develop nausea and vomiting, or diarrhea that is not controlled by your medication, call the clinic.  The clinic phone number is [phone]. Office hours are Monday-Friday 8:30am-5:00pm.  BELOW ARE SYMPTOMS THAT SHOULD BE REPORTED IMMEDIATELY:  *FEVER GREATER THAN 101.0 F  *CHILLS WITH OR WITHOUT FEVER  NAUSEA AND VOMITING THAT IS NOT CONTROLLED WITH YOUR NAUSEA MEDICATION  *UNUSUAL SHORTNESS OF BREATH  *UNUSUAL BRUISING OR BLEEDING  TENDERNESS IN MOUTH AND THROAT WITH OR WITHOUT PRESENCE OF ULCERS  *URINARY PROBLEMS  *BOWEL PROBLEMS  UNUSUAL RASH Items with * indicate a potential emergency and should be followed up as soon as possible. If you have an emergency after office hours please contact your primary care physician or go to the nearest emergency department.  Please call the clinic during office hours if you have any questions or concerns.   You may also contact the Patient Navigator at [phone] should you have any questions or need assistance in obtaining follow up care.      Resources For Cancer Patients and their Caregivers ? American Cancer Society: Can assist with transportation, wigs, general needs, runs Look Good Feel Better.        (617)513-7334 ? Cancer Care: Provides financial assistance, online support groups, medication/co-pay assistance.   1-800-813-HOPE (772)848-1813) ? [place] Assists [place] cancer patients and their families through emotional , educational and financial support.  331-147-7930 ? [place] DSS Where to apply for food stamps, Medicaid and utility assistance. 386-670-0866 ? [facility]: Transportation to medical appointments. 3120902910 ? Social Security Administration: May apply for disability if have a Stage IV cancer. 4192353019 910-028-8724 ? [employer], Disability and Transit Services: Assists with nutrition, care and transit needs. ([phone] [phone]

## 2018-10-27 ENCOUNTER — Encounter (HOSPITAL_COMMUNITY): Payer: Self-pay

## 2018-10-27 ENCOUNTER — Inpatient Hospital Stay (HOSPITAL_COMMUNITY): Payer: Medicare Other

## 2018-10-27 ENCOUNTER — Other Ambulatory Visit: Payer: Self-pay

## 2018-10-27 VITALS — BP 114/68 | HR 97 | Temp 97.6°F | Resp 18

## 2018-10-27 DIAGNOSIS — C7801 Secondary malignant neoplasm of right lung: Secondary | ICD-10-CM | POA: Diagnosis not present

## 2018-10-27 DIAGNOSIS — C7802 Secondary malignant neoplasm of left lung: Secondary | ICD-10-CM | POA: Diagnosis not present

## 2018-10-27 DIAGNOSIS — G893 Neoplasm related pain (acute) (chronic): Secondary | ICD-10-CM | POA: Diagnosis not present

## 2018-10-27 DIAGNOSIS — C7889 Secondary malignant neoplasm of other digestive organs: Secondary | ICD-10-CM | POA: Diagnosis not present

## 2018-10-27 DIAGNOSIS — C20 Malignant neoplasm of rectum: Secondary | ICD-10-CM

## 2018-10-27 DIAGNOSIS — Z5111 Encounter for antineoplastic chemotherapy: Secondary | ICD-10-CM | POA: Diagnosis not present

## 2018-10-27 MED ORDER — SODIUM CHLORIDE 0.9% FLUSH
10.0000 mL | INTRAVENOUS | Status: DC | PRN
  Administered 2018-10-27: 10 mL
  Filled 2018-10-27: qty 10

## 2018-10-27 MED ORDER — HEPARIN SOD (PORK) LOCK FLUSH 100 UNIT/ML IV SOLN
500.0000 [IU] | Freq: Once | INTRAVENOUS | Status: AC | PRN
  Administered 2018-10-27: 500 [IU]

## 2018-10-27 MED ORDER — PEGFILGRASTIM INJECTION 6 MG/0.6ML ~~LOC~~
6.0000 mg | PREFILLED_SYRINGE | Freq: Once | SUBCUTANEOUS | Status: AC
  Administered 2018-10-27: 6 mg via SUBCUTANEOUS

## 2018-10-27 MED ORDER — PEGFILGRASTIM INJECTION 6 MG/0.6ML ~~LOC~~
PREFILLED_SYRINGE | SUBCUTANEOUS | Status: AC
  Filled 2018-10-27: qty 0.6

## 2018-10-27 NOTE — Progress Notes (Signed)
Patients port flushed without difficulty.  Good blood return noted before and after flush. No complaints of pain with flush and no bruising or swelling noted at site.  Band aid applied.  VSS with discharge and left ambulatory with no s/s of distress noted.

## 2018-10-28 ENCOUNTER — Inpatient Hospital Stay (HOSPITAL_COMMUNITY): Payer: Medicare Other

## 2018-10-28 ENCOUNTER — Encounter (HOSPITAL_COMMUNITY): Payer: Self-pay | Admitting: Hematology

## 2018-10-28 ENCOUNTER — Other Ambulatory Visit (HOSPITAL_COMMUNITY): Payer: Self-pay | Admitting: Emergency Medicine

## 2018-10-28 ENCOUNTER — Inpatient Hospital Stay (HOSPITAL_BASED_OUTPATIENT_CLINIC_OR_DEPARTMENT_OTHER): Payer: Medicare Other | Admitting: Hematology

## 2018-10-28 ENCOUNTER — Encounter (HOSPITAL_COMMUNITY): Payer: Self-pay | Admitting: *Deleted

## 2018-10-28 ENCOUNTER — Other Ambulatory Visit (HOSPITAL_COMMUNITY): Payer: Self-pay | Admitting: Hematology

## 2018-10-28 VITALS — BP 115/56 | HR 89 | Temp 98.2°F | Resp 18

## 2018-10-28 DIAGNOSIS — I10 Essential (primary) hypertension: Secondary | ICD-10-CM | POA: Diagnosis not present

## 2018-10-28 DIAGNOSIS — Z79899 Other long term (current) drug therapy: Secondary | ICD-10-CM

## 2018-10-28 DIAGNOSIS — C7889 Secondary malignant neoplasm of other digestive organs: Secondary | ICD-10-CM | POA: Diagnosis not present

## 2018-10-28 DIAGNOSIS — C20 Malignant neoplasm of rectum: Secondary | ICD-10-CM

## 2018-10-28 DIAGNOSIS — E119 Type 2 diabetes mellitus without complications: Secondary | ICD-10-CM

## 2018-10-28 DIAGNOSIS — Z86718 Personal history of other venous thrombosis and embolism: Secondary | ICD-10-CM | POA: Diagnosis not present

## 2018-10-28 DIAGNOSIS — Z7901 Long term (current) use of anticoagulants: Secondary | ICD-10-CM

## 2018-10-28 DIAGNOSIS — Z7984 Long term (current) use of oral hypoglycemic drugs: Secondary | ICD-10-CM

## 2018-10-28 DIAGNOSIS — C7801 Secondary malignant neoplasm of right lung: Secondary | ICD-10-CM

## 2018-10-28 DIAGNOSIS — G893 Neoplasm related pain (acute) (chronic): Secondary | ICD-10-CM | POA: Diagnosis not present

## 2018-10-28 DIAGNOSIS — E860 Dehydration: Secondary | ICD-10-CM

## 2018-10-28 DIAGNOSIS — G629 Polyneuropathy, unspecified: Secondary | ICD-10-CM

## 2018-10-28 DIAGNOSIS — R110 Nausea: Secondary | ICD-10-CM

## 2018-10-28 DIAGNOSIS — Z86711 Personal history of pulmonary embolism: Secondary | ICD-10-CM

## 2018-10-28 DIAGNOSIS — Z933 Colostomy status: Secondary | ICD-10-CM

## 2018-10-28 DIAGNOSIS — C7802 Secondary malignant neoplasm of left lung: Secondary | ICD-10-CM

## 2018-10-28 DIAGNOSIS — R112 Nausea with vomiting, unspecified: Secondary | ICD-10-CM

## 2018-10-28 DIAGNOSIS — G479 Sleep disorder, unspecified: Secondary | ICD-10-CM

## 2018-10-28 DIAGNOSIS — Z5111 Encounter for antineoplastic chemotherapy: Secondary | ICD-10-CM | POA: Diagnosis not present

## 2018-10-28 LAB — COMPREHENSIVE METABOLIC PANEL
ALT: 18 U/L (ref 0–44)
AST: 21 U/L (ref 15–41)
Albumin: 3.3 g/dL — ABNORMAL LOW (ref 3.5–5.0)
Alkaline Phosphatase: 95 U/L (ref 38–126)
Anion gap: 10 (ref 5–15)
BUN: 20 mg/dL (ref 8–23)
CO2: 25 mmol/L (ref 22–32)
Calcium: 8.7 mg/dL — ABNORMAL LOW (ref 8.9–10.3)
Chloride: 98 mmol/L (ref 98–111)
Creatinine, Ser: 0.82 mg/dL (ref 0.61–1.24)
GFR calc Af Amer: >60 mL/min (ref >60)
GFR calc non Af Amer: >60 mL/min (ref >60)
Glucose, Bld: 167 mg/dL — ABNORMAL HIGH (ref 70–99)
Potassium: 3.7 mmol/L (ref 3.5–5.1)
Sodium: 133 mmol/L — ABNORMAL LOW (ref 135–145)
Total Bilirubin: 1.6 mg/dL — ABNORMAL HIGH (ref 0.3–1.2)
Total Protein: 6.1 g/dL — ABNORMAL LOW (ref 6.5–8.1)

## 2018-10-28 LAB — CBC WITH DIFFERENTIAL/PLATELET
Abs Immature Granulocytes: 4.59 10*3/uL — ABNORMAL HIGH (ref 0.00–0.07)
Basophils Absolute: 0.2 10*3/uL — ABNORMAL HIGH (ref 0.0–0.1)
Basophils Relative: 1 %
Eosinophils Absolute: 0.1 10*3/uL (ref 0.0–0.5)
Eosinophils Relative: 0 %
HCT: 39 % (ref 39.0–52.0)
Hemoglobin: 12.8 g/dL — ABNORMAL LOW (ref 13.0–17.0)
Immature Granulocytes: 11 %
Lymphocytes Relative: 6 %
Lymphs Abs: 2.5 10*3/uL (ref 0.7–4.0)
MCH: 31.1 pg (ref 26.0–34.0)
MCHC: 32.8 g/dL (ref 30.0–36.0)
MCV: 94.9 fL (ref 80.0–100.0)
Monocytes Absolute: 0.3 10*3/uL (ref 0.1–1.0)
Monocytes Relative: 1 %
Neutro Abs: 34.2 10*3/uL — ABNORMAL HIGH (ref 1.7–7.7)
Neutrophils Relative %: 81 %
Platelets: 304 10*3/uL (ref 150–400)
RBC: 4.11 MIL/uL — ABNORMAL LOW (ref 4.22–5.81)
RDW: 14.5 % (ref 11.5–15.5)
WBC: 41.9 10*3/uL — ABNORMAL HIGH (ref 4.0–10.5)
nRBC: 0 % (ref 0.0–0.2)

## 2018-10-28 LAB — MAGNESIUM: Magnesium: 1.8 mg/dL (ref 1.7–2.4)

## 2018-10-28 MED ORDER — HEPARIN SOD (PORK) LOCK FLUSH 100 UNIT/ML IV SOLN
500.0000 [IU] | Freq: Once | INTRAVENOUS | Status: AC | PRN
  Administered 2018-10-28: 500 [IU]

## 2018-10-28 MED ORDER — ALTEPLASE 2 MG IJ SOLR: 2.0000 mg | Freq: Once | INTRAMUSCULAR | Status: DC | PRN

## 2018-10-28 MED ORDER — SODIUM CHLORIDE 0.9% FLUSH
10.0000 mL | Freq: Once | INTRAVENOUS | Status: AC | PRN
  Administered 2018-10-28: 10 mL

## 2018-10-28 MED ORDER — SODIUM CHLORIDE 0.9% FLUSH: 3.0000 mL | Freq: Once | INTRAVENOUS | Status: DC | PRN

## 2018-10-28 MED ORDER — SODIUM CHLORIDE 0.9 % IV SOLN
Freq: Once | INTRAVENOUS | Status: AC
  Administered 2018-10-28: 15:00:00 via INTRAVENOUS
  Filled 2018-10-28: qty 1000

## 2018-10-28 MED ORDER — HEPARIN SOD (PORK) LOCK FLUSH 100 UNIT/ML IV SOLN: 250.0000 [IU] | Freq: Once | INTRAVENOUS | Status: DC | PRN

## 2018-10-28 MED ORDER — PROCHLORPERAZINE MALEATE 10 MG PO TABS: 10.0000 mg | ORAL_TABLET | Freq: Four times a day (QID) | ORAL | 0 refills | Status: DC | PRN

## 2018-10-28 NOTE — Assessment & Plan Note (Signed)
1.  Stage IV rectal cancer with lung and pancreatic metastasis: -12 cycles of FOLFOX in the adjuvant setting from October 2012 through 07/14/2011. -15 cycles of FOLFOX and bevacizumab from 07/08/2016 through 01/20/2017, followed by infusional 5-FU and bevacizumab until 07/14/2017 -Maintenance Xeloda started on 08/07/2017 with bevacizumab, dose reduced to 1500 mg twice daily during cycle 2.  Prior to that he was on 5-FU maintenance. -CT CAP on 07/30/2018 showed no significant progressive metastatic disease.  No significant change in the right upper lobe solid pulmonary nodules.  Stable mixed lytic and sclerotic lower sternal lesion.  Stable malignant pancreatic head mass. -MRCP showed intrahepatic and extrahepatic biliary dilatation with stricture at common bile duct.  Mass in the head of the pancreas has increased in size to 2.1 cm, previously 1.7 cm. -ERCP and stent placement on 09/16/2018, showing a single localized biliary stricture.  Biliary sphincterotomy was also performed and stricture dilated to 6 mm with the balloon to facilitate stent placement.  1 covered metal stent was placed. -Flexible sigmoidoscopy on 09/24/2018 shows narrowing of the colorectal anastomosis. - Transverse loop colostomy on 09/29/2018. -Cycle 1 of FOLFIRI on 10/04/2018.   -He was admitted from 10/16/2018 through 10/17/2018 for nausea and dry heaves.  He was emptying his bag 2 times a day but it was more liquidy.  He normally empties the bag twice a day.  No cramping reported.  He felt very tired after Cycle 1.  -Regimen dose reduced by 20%. Neulasta added  to the regimen.  - Completed Cycle 2 10/25/18. Continue to hold bevacizumab until 28 days post- op. Plan to add to Cycle 3.  -Today, we will check a CBC CMP and mag to evaluate blood and electrolyte status and provide supplementation as indicated.  We will also administer 1 L of fluid.   -He will return tomorrow for additional fluids.  2.  Peripheral neuropathy: -We will continue  gabapentin 900 mg twice daily.   3.  DVT and PE: -Diagnosed in 2016, status post IVC filter. -Xarelto was temporarily held.  He started back on it.  4.  Rectal pain: -He will continue hydrocodone as needed.  5.  Sleep problems: -Trazodone 100 mg tablet at bedtime is helping.  6.  Decreased appetite: -Currently on Megace 5 mg twice daily.

## 2018-10-28 NOTE — Progress Notes (Signed)
Charles [name] tolerated IV hydration with magnesium and potassium well without complaints or incident. VSS upon discharge. Labs reviewed with Dr. [name]. Pt discharged via wheelchair in satisfactory condition

## 2018-10-28 NOTE — Patient Instructions (Signed)
[name] at [facility] Discharge Instructions Received IV hydration with magnesium and potassium. Follow-up as scheduled. Call clinic for any questions or concerns   Thank you for choosing [place] at [facility] to provide your oncology and hematology care.  To afford each patient quality time with our provider, please arrive at least 15 minutes before your scheduled appointment time.   If you have a lab appointment with the [facility] please come in thru the  Main Entrance and check in at the main information desk  You need to re-schedule your appointment should you arrive 10 or more minutes late.  We strive to give you quality time with our providers, and arriving late affects you and other patients whose appointments are after yours.  Also, if you no show three or more times for appointments you may be dismissed from the clinic at the providers discretion.     Again, thank you for choosing  [facility].  Our hope is that these requests will decrease the amount of time that you wait before being seen by our physicians.       _____________________________________________________________  Should you have questions after your visit to [facility], please contact our office at [phone] between the hours of 8:00 a.m. and 4:30 p.m.  Voicemails left after 4:00 p.m. will not be returned until the following business day.  For prescription refill requests, have your pharmacy contact our office and allow 72 hours.    Cancer Center Support Programs:   > Cancer Support Group  2nd Tuesday of the month 1pm-2pm, Journey Room

## 2018-10-28 NOTE — Progress Notes (Signed)
[place] [place] [address], [place] [address]   CLINIC:  Medical Oncology/Hematology  PCP:  [name], MD [place] [address] [phone]   REASON FOR VISIT:  Follow-up for Stage IV rectal cancer  CURRENT THERAPY: FOLFIRI  BRIEF ONCOLOGIC HISTORY:  Oncology History  Rectal cancer ([place])  07/24/2010 Initial Diagnosis   Invasive adenocarcinoma of rectum   09/09/2010 Concurrent Chemotherapy   S/P radiation and concurrent 5-FU continuous infusion from 09/09/10- 10/10/10.   11/14/2010 Surgery   S/P proctectomy with colorectal anastomosis and diverting loop ileostomy on 11/14/10 at [facility] by Dr. Harlon Ditty. Pathology reveals a pT3b N1 with 3/20 lymph nodes.   02/05/2011 - 07/14/2011 Chemotherapy   FOLFOX   08/18/2011 Surgery   Approximate date of surgery- [place] by Dr. Harlon Ditty    Remission     11/24/2013 Survivorship   Colonoscopy- somewhat fibrotic/friable anastomotic mucosal-status post biopsy (narrowing not felt to be clinically significant).  Negative pathology for malignancy   09/12/2014 Imaging   DVT in the left femoral venous system, left common iliac vein, IVC, and within the IVC filter Right lower extremity venography confirms chronic occlusion of the femoral venous system with collateralization. The right iliac venous system is patent and do   09/25/2014 PET scan   The right middle lobe pulmonary nodule is hypermetabolic, favored to represent a primary bronchogenic carcinoma.Equivocal mediastinal nodes, similar to surrounding blood pool. Bilateral adrenal hypermetabolism, felt to be physiologic   10/24/2014 Pathology Results   Lung, needle/core biopsy(ies), RML - ADENOCARCINOMA, SEE COMMENT metastatic adenocarcinoma of a colorectal primary   10/24/2014 Relapse/Recurrence     11/16/2014 Definitive Surgery   Bronchoscopy, right video-assisted thoracoscopy, wedge resection of right middle lobe by Dr. Servando Snare   11/16/2014  Pathology Results   Lung, wedge biopsy/resection, right lingula and small portion of middle lobe - METASTATIC ADENOCARCINOMA, CONSISTENT WITH COLORECTAL PRIMARY, SPANNING 2.0 CM. - THE SURGICAL RESECTION MARGINS ARE NEGATIVE FOR ADENOCARCINOMA.   11/16/2014 Remission   THE SURGICAL RESECTION MARGINS ARE NEGATIVE FOR ADENOCARCINOMA.   03/07/2015 Imaging   CT CAP- Interval resection of right middle lobe metastasis. No acute process or evidence of metastatic disease in the chest, abdomen or pelvis. Improved right upper lobe reticular nodular opacities are favored to represent resolving infection.   09/14/2015 Imaging   CT CAP- No findings of recurrent malignancy. No recurrence along the wedge resection site of the right middle lobe. Right anterior abdominal wall focal hernia containing a knuckle of small bowel without complicating feature.   06/13/2016 Imaging   CT CAP- 1. New right-sided pleural metastasis/mass. Other smaller right-sided pulmonary nodules which are all pleural-based and most likely represent pleural metastasis. 2. No convincing evidence of abdominopelvic nodal metastasis. 3. Constellation of findings, including pancreatic atrophy, duct dilatation, and pancreatic head soft tissue fullness which are highly suspicious for pancreatic adenocarcinoma. Metastatic disease felt much less likely. Consider endoscopic ultrasound sampling or ERCP. Cannot exclude superimposed acute pancreatitis. 4. New enlargement of the appendiceal tip with subtle surrounding edema. Cannot exclude early or mild appendicitis. 5. Coronary artery atherosclerosis. Aortic atherosclerosis. 6. 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   06/13/2016 Progression   CT scan demonstrates progression of disease   06/19/2016 Procedure   EUS with FNA by Dr. Ardis Hughs   06/20/2016 Pathology Results   FINE NEEDLE ASPIRATION, ENDOSCOPIC, PANCREAS UNCINATE AREA(SPECIMEN 1 OF 1 COLLECTED  06/19/16): MALIGNANT CELLS CONSISTENT WITH METASTATIC ADENOCARCINOMA.  06/25/2016 PET scan   1. Two pleural-based nodules in the right hemithorax are hypermetabolic. Metastatic disease is a distinct consideration. The scattered pulmonary parenchymal nodule seen on previous diagnostic CT imaging are below the threshold for reliable resolution on PET imaging. 2. Hypermetabolic lesion pancreatic head, consistent with neoplasm. As noted on prior CT, adenocarcinoma is any consideration. No evidence for hypermetabolic abdominal lymphadenopathy. Mottled uptake noted in the liver, but no discrete hepatic metastases are evident on PET imaging. 3. Appendix remains distended up to 0.9-10 mm diameter with a stone towards the tip in subtle periappendiceal edema/inflammation. The appendix is hypermetabolic along its length. Imaging features are relatively stable in the 12 day interval since prior CT scan. While appendicitis is a consideration, the relative stability over 12 days would be unusual for that etiology. Continued close follow-up recommended.   06/26/2016 Pathology Results   Not enough tissue for foundationONE or K-ras testing.   07/07/2016 Procedure   Port placed by Dr. Arnoldo Morale   07/08/2016 -  Chemotherapy   The patient had pegfilgrastim (NEULASTA) injection 6 mg, 6 mg, Subcutaneous,  Once, 0 of 4 cycles  ondansetron (ZOFRAN) IVPB 8 mg, 8 mg, Intravenous,  Once, 0 of 4 cycles  leucovorin 804 mg in dextrose 5 % 250 mL infusion, 400 mg/m2, Intravenous,  Once, 0 of 4 cycles  oxaliplatin (ELOXATIN) 170 mg in dextrose 5 % 500 mL chemo infusion, 85 mg/m2, Intravenous,  Once, 0 of 4 cycles  fluorouracil (ADRUCIL) 4,800 mg in sodium chloride 0.9 % 150 mL chemo infusion, 2,400 mg/m2 = 4,800 mg, Intravenous, 1 Day/Dose, 0 of 4 cycles  fluorouracil (ADRUCIL) chemo injection 800 mg, 400 mg/m2, Intravenous,  Once, 0 of 4 cycles  pegfilgrastim (NEULASTA) injection 6 mg, 6 mg, Subcutaneous,  Once, 2  of 2 cycles  ondansetron (ZOFRAN) IVPB 8 mg, 8 mg, Intravenous,  Once, 12 of 12 cycles  leucovorin 660 mg in dextrose 5 % 250 mL infusion, 660 mg (original dose ), Intravenous,  Once, 12 of 12 cycles Dose modification: 660 mg (Cycle 1)  oxaliplatin (ELOXATIN) 140 mg in dextrose 5 % 500 mL chemo infusion, 140 mg (original dose ), Intravenous,  Once, 12 of 12 cycles Dose modification: 140 mg (Cycle 1), 119 mg (85 % of original dose 140 mg, Cycle 8, Reason: Provider Judgment)  fluorouracil (ADRUCIL) 3,850 mg in sodium chloride 0.9 % 150 mL chemo infusion, 3,850 mg (original dose ), Intravenous, 1 Day/Dose, 12 of 12 cycles Dose modification: 3,850 mg (Cycle 1)  fluorouracil (ADRUCIL) chemo injection 700 mg, 700 mg (original dose ), Intravenous,  Once, 12 of 12 cycles Dose modification: 700 mg (Cycle 1)  palonosetron (ALOXI) injection 0.25 mg, 0.25 mg, Intravenous,  Once, 7 of 12 cycles Administration: 0.25 mg (09/30/2016)  bevacizumab (AVASTIN) 475 mg in sodium chloride 0.9 % 100 mL chemo infusion, 5 mg/kg = 475 mg, Intravenous,  Once, 6 of 11 cycles Administration: 500 mg (09/30/2016)  leucovorin 880 mg in dextrose 5 % 250 mL infusion, 400 mg/m2 = 880 mg, Intravenous,  Once, 7 of 12 cycles Administration: 880 mg (09/30/2016)  oxaliplatin (ELOXATIN) 185 mg in dextrose 5 % 500 mL chemo infusion, 85 mg/m2 = 185 mg, Intravenous,  Once, 7 of 12 cycles Administration: 185 mg (09/30/2016)  fluorouracil (ADRUCIL) chemo injection 900 mg, 400 mg/m2 = 900 mg, Intravenous,  Once, 7 of 12 cycles Administration: 900 mg (09/30/2016)  fluorouracil (ADRUCIL) 5,300 mg in sodium chloride 0.9 % 144 mL chemo infusion, 2,400 mg/m2 = 5,300  mg, Intravenous, 1 Day/Dose, 7 of 12 cycles Administration: 5,300 mg (09/30/2016)  for chemotherapy treatment.     08/27/2016 Imaging   CT CAP- 1. Pulmonary metastatic disease is stable. 2. Pancreatic head mass, grossly stable, with progressive atrophy of the body and tail  of the pancreas. Stable peripancreatic lymph node. 3. Mild circumferential rectal wall thickening, stable. 4. Aortic atherosclerosis (ICD10-170.0). Coronary artery calcification. 5. Hyperattenuating lesion off the lower pole left kidney, stable, too small to characterize. Continued attention on followup exams is warranted. 6. Persistent wall thickening and mild dilatation of the proximal appendix, of uncertain etiology. Continued attention on followup exams is warranted.   11/06/2016 Imaging   CT C/A/P: IMPRESSION: 1. Stable pulmonary metastatic disease. 2. Similar appearing pancreatic head mass. Progressed atrophy of the pancreatic body and tail. Stable peripancreatic lymph node. 3. Stable hypoattenuating lesion partially exophytic off the inferior pole of the left kidney. 4. Aortic atherosclerosis. 5. Persistent mild wall thickening and mild dilatation of the appendix.    01/30/2017 Imaging   Ct C/A/P: IMPRESSION: 1. Stable pulmonary metastatic disease. 2. Stable to slight increase in size of pancreatic head mass. 3.  Aortic Atherosclerosis (ICD10-I70.0). 4. Similar appearance of mild wall thickening of the appendix which contains an appendicolith.   05/01/2017 Imaging   CT C/A/P: Stable disease in the lungs, no progression of disease in the abdomen.   10/04/2018 -  Chemotherapy   The patient had palonosetron (ALOXI) injection 0.25 mg, 0.25 mg, Intravenous,  Once, 2 of 4 cycles Administration: 0.25 mg (10/04/2018), 0.25 mg (10/25/2018) pegfilgrastim (NEULASTA) injection 6 mg, 6 mg, Subcutaneous, Once, 1 of 3 cycles Administration: 6 mg (10/27/2018) bevacizumab (AVASTIN) 450 mg in sodium chloride 0.9 % 100 mL chemo infusion, 5 mg/kg = 450 mg, Intravenous,  Once, 1 of 3 cycles irinotecan (CAMPTOSAR) 380 mg in sodium chloride 0.9 % 500 mL chemo infusion, 180 mg/m2 = 380 mg, Intravenous,  Once, 2 of 4 cycles Dose modification: 144 mg/m2 (original dose 180 mg/m2, Cycle 2, Reason:  Provider Judgment, Comment: 20% dose reduction) Administration: 380 mg (10/04/2018), 300 mg (10/25/2018) leucovorin 900 mg in sodium chloride 0.9 % 250 mL infusion, 421 mg/m2 = 856 mg, Intravenous,  Once, 2 of 4 cycles Dose modification: 320 mg/m2 (original dose 400 mg/m2, Cycle 2, Reason: Provider Judgment, Comment: 20% dose reduction) Administration: 900 mg (10/04/2018), 700 mg (10/25/2018) fluorouracil (ADRUCIL) chemo injection 850 mg, 400 mg/m2 = 850 mg, Intravenous,  Once, 2 of 4 cycles Dose modification: 320 mg/m2 (original dose 400 mg/m2, Cycle 2, Reason: Provider Judgment, Comment: 20% dose reduction) Administration: 850 mg (10/04/2018), 700 mg (10/25/2018) fluorouracil (ADRUCIL) 5,000 mg in sodium chloride 0.9 % 150 mL chemo infusion, 2,325 mg/m2 = 5,150 mg, Intravenous, 1 Day/Dose, 2 of 4 cycles Dose modification: 1,920 mg/m2 (original dose 2,400 mg/m2, Cycle 2, Reason: Provider Judgment, Comment: 20% dose reduction) Administration: 5,000 mg (10/04/2018), 4,100 mg (10/25/2018)  for chemotherapy treatment.       CANCER STAGING: Cancer Staging Rectal cancer [facility]) Staging form: Colon and Rectum, AJCC 7th Edition - Clinical: Stage IIIB (T3, N1, M0) - Signed by [name], MD on 02/04/2011 - Pathologic stage from 11/17/2014: Stage IVA (M1a) - Signed by Baird Cancer, PA-C on 09/14/2015    INTERVAL HISTORY:  Charles [name] 68 y.o. male presents today for sick visit.  He presented for treatment on October 25, 2018 and received FOLFIRI.  In the interim, he has developed nausea with dry heaves, no vomiting.  He has no  appetite.  He overall feels very fatigued and weak.  Dates he still is only emptying his colostomy bag twice a day.  He does not seem to think there has been increasing output.   REVIEW OF SYSTEMS:  Review of Systems  Constitutional: Positive for appetite change and fatigue.  HENT:  Negative.   Eyes: Negative.   Respiratory: Negative.   Cardiovascular: Negative.    Gastrointestinal: Positive for nausea.  Genitourinary: Negative.    Musculoskeletal: Negative.   Skin: Negative.   Neurological: Negative.   Hematological: Negative.   Psychiatric/Behavioral: Negative.      PAST MEDICAL/SURGICAL HISTORY:  Past Medical History:  Diagnosis Date   Chronic anticoagulation    Colon cancer ([place]) 07/24/2010   rectal ca, inv adenocarcinoma   Depression 04/01/2011   Diabetes mellitus without complication ([place])    DVT (deep venous thrombosis) ([place]) 05/09/2011   GERD (gastroesophageal reflux disease)    High output ileostomy ([place]) 05/21/2011   History of kidney stones    HTN (hypertension)    Hx of radiation therapy 09/02/10 to 10/14/10   pelvis   Lung metastasis ([facility])    Neuropathy    Peripheral vascular disease ([place])    dvt's,pe   Pneumonia    hx x3   Pulmonary embolism ([place])    Rectal cancer ([place]) 01/15/2011   S/P radiation and concurrent 5-FU continuous infusion from 09/09/10- 10/10/10.  S/P proctectomy with colorectal anastomosis and diverting loop ileostomy on 11/14/10 at [facility] by Dr. Harlon Ditty. Pathology reveals a pT3b N1 with 3/20 lymph nodes.     Past Surgical History:  Procedure Laterality Date   BILIARY STENT PLACEMENT N/A 09/16/2018   Procedure: STENT PLACEMENT;  Surgeon: Rogene Houston, MD;  [facility];  Service: Endoscopy;  Laterality: N/A;   BIOPSY  09/24/2018   Procedure: BIOPSY;  Surgeon: Danie Binder, MD;  [facility];  Service: Endoscopy;;   COLON SURGERY  11/14/2010   proctectomy with colorectal anastomosis and diverting loop ileostomy (temporary planned)   COLONOSCOPY  07/2010   proximal rectal apple core mass 10-14cm from anal verge (adenocarcinoma), 2-3cm distal rectal carpet polyp s/p piecemeal snare polypectomy (adenoma)   COLONOSCOPY  04/21/2012   RMR: Friable,fibrotic appearing colorectal anastomosis producing some luminal narrowing-not felt to be critical. path: focal erosion  with slight inflammation and hyperemia. SURVEILLANCE DUE DEC 2015   COLONOSCOPY N/A 11/24/2013   Dr. Rourk:somewhat fibrotic/friable anastomotic mucosal-status post biopsy (narrowing not felt to be clinically significant) Single colonic diverticulum. benign polypoid rectal mucosa   colostomy reversal  april 2013   ERCP N/A 09/16/2018   Procedure: ENDOSCOPIC RETROGRADE CHOLANGIOPANCREATOGRAPHY (ERCP);  Surgeon: Rogene Houston, MD;  [facility];  Service: Endoscopy;  Laterality: N/A;   ESOPHAGOGASTRODUODENOSCOPY  07/2010   RMR: schatki ring s/p dilation, small hh, SB bx benign   ESOPHAGOGASTRODUODENOSCOPY N/A 09/04/2015   Procedure: ESOPHAGOGASTRODUODENOSCOPY (EGD);  Surgeon: Daneil Dolin, MD;  [facility];  Service: Endoscopy;  Laterality: N/A;  730   EUS  08/2010   Dr. Owens Loffler. uT3N0 circumferential, nearly obstruction rectosigmoid adenocarcinoma, distal edge 12cm from anal verge   EUS N/A 06/19/2016   Procedure: UPPER ENDOSCOPIC ULTRASOUND (EUS) RADIAL;  Surgeon: Milus Banister, MD;  [facility];  Service: Endoscopy;  Laterality: N/A;   FLEXIBLE SIGMOIDOSCOPY N/A 09/24/2018   Procedure: FLEXIBLE SIGMOIDOSCOPY;  Surgeon: Danie Binder, MD;  [facility];  Service: Endoscopy;  Laterality: N/A;  HERNIA REPAIR     abd hernia repair   IVC filter     ivc filter     port a cath placement     PORT-A-CATH REMOVAL  09/24/2011   Procedure: REMOVAL PORT-A-CATH;  Surgeon: Donato Heinz, MD;  Location: [facility];  Service: General;  Laterality: N/A;  Minor Room   PORTACATH PLACEMENT Right 07/07/2016   Procedure: INSERTION PORT-A-CATH;  Surgeon: Aviva Signs, MD;  Location: [facility];  Service: General;  Laterality: Right;   SPHINCTEROTOMY N/A 09/16/2018   Procedure: SPHINCTEROTOMY with balloon dialation;  Surgeon: Rogene Houston, MD;  [facility];  Service: Endoscopy;  Laterality: N/A;   TRANSVERSE LOOP COLOSTOMY N/A 09/29/2018    Procedure: TRANSVERSE LOOP COLOSTOMY;  Surgeon: Aviva Signs, MD;  Location: [facility];  Service: General;  Laterality: N/A;   VIDEO ASSISTED THORACOSCOPY (VATS)/WEDGE RESECTION Right 11/15/2014   Procedure: VIDEO ASSISTED THORACOSCOPY (VATS)/LUNG RESECTION WITH RIGHT LINGULECTOMY;  Surgeon: [name], MD;  Location: [facility];  Service: Thoracic;  Laterality: Right;   VIDEO BRONCHOSCOPY N/A 11/15/2014   Procedure: VIDEO BRONCHOSCOPY;  Surgeon: [name], MD;  Location[facility];  Service: Thoracic;  Laterality: N/A;     SOCIAL HISTORY:  Social History   Socioeconomic History   Marital status: Married    Spouse name: Not on file   Number of children: 2   Years of education: Not on file   Highest education level: Not on file  Occupational History   Occupation: self-employed Regulatory affairs officer, currently not working    Fish farm manager: [employer] resource strain: Not very hard   Food insecurity    Worry: Never true    Inability: Never true   Transportation needs    Medical: No    Non-medical: No  Tobacco Use   Smoking status: Never Smoker   Smokeless tobacco: Never Used  Substance and Sexual Activity   Alcohol use: No   Drug use: No   Sexual activity: Yes    Birth control/protection: None    Comment: married  Lifestyle   Physical activity    Days per week: 0 days    Minutes per session: 0 min   Stress: Only a little  Relationships   Social connections    Talks on phone: More than three times a week    Gets together: Once a week    Attends religious service: Patient refused    Active member of club or organization: Patient refused    Attends meetings of clubs or organizations: Patient refused    Relationship status: Patient refused   Intimate partner violence    Fear of current or ex partner: Patient refused    Emotionally abused: Patient refused    Physically abused: Patient refused    Forced sexual activity:  Patient refused  Other Topics Concern   Not on file  Social History Narrative   Not on file    FAMILY HISTORY:  Family History  Problem Relation Age of Onset   Cancer Brother        throat   Cancer Brother        prostate   Colon cancer Neg Hx    Liver disease Neg Hx    Inflammatory bowel disease Neg Hx     CURRENT MEDICATIONS:  Outpatient Encounter Medications as of 10/28/2018  Medication Sig   acetaminophen (TYLENOL) 500 MG tablet Take 1,000 mg by mouth every 6 (six) hours as needed  for mild pain or moderate pain.    clonazePAM (KLONOPIN) 0.5 MG tablet Take 1 tablet (0.5 mg total) by mouth 2 (two) times daily as needed.   Diphenhyd-Hydrocort-Nystatin (FIRST-DUKES MOUTHWASH) SUSP Use as directed 5 mLs in the mouth or throat 4 (four) times daily.   diphenoxylate-atropine (LOMOTIL) 2.5-0.025 MG tablet Take 1 tablet by mouth 4 (four) times daily as needed for diarrhea or loose stools.   dronabinol (MARINOL) 5 MG capsule Take 1 capsule (5 mg total) by mouth 2 (two) times daily before a meal.   gabapentin (NEURONTIN) 300 MG capsule Take 3 capsules (900 mg total) by mouth 2 (two) times daily.   glimepiride (AMARYL) 2 MG tablet Take 2 mg by mouth daily with breakfast.    mirtazapine (REMERON) 7.5 MG tablet Take 1 tablet (7.5 mg total) by mouth at bedtime.   omeprazole (PRILOSEC) 20 MG capsule Take 20 mg by mouth 2 (two) times daily before a meal.    ondansetron (ZOFRAN) 8 MG tablet Take 1 tablet (8 mg total) by mouth 2 (two) times daily as needed for refractory nausea / vomiting. Start on day 3 after chemotherapy.   oxyCODONE-acetaminophen (PERCOCET) 10-325 MG tablet Take 1 tablet by mouth every 6 (six) hours as needed for pain.   Probiotic Product (PROBIOTIC DAILY PO) Take 1 capsule by mouth daily.   prochlorperazine (COMPAZINE) 10 MG tablet Take 1 tablet (10 mg total) by mouth every 6 (six) hours as needed for nausea or vomiting.   promethazine (PHENERGAN) 25 MG  tablet Take 1 tablet (25 mg total) by mouth every 8 (eight) hours as needed for refractory nausea / vomiting.   rivaroxaban (XARELTO) 20 MG TABS tablet Take 1 tablet (20 mg total) by mouth daily with supper.   traZODone (DESYREL) 100 MG tablet Take 1 tablet (100 mg total) by mouth at bedtime.   vitamin B-12 (CYANOCOBALAMIN) 1000 MCG tablet Take 1,000 mcg by mouth daily.   Facility-Administered Encounter Medications as of 10/28/2018  Medication   sodium chloride 0.9 % injection 10 mL   sodium chloride flush (NS) 0.9 % injection 10 mL    ALLERGIES:  Allergies  Allergen Reactions   Oxycodone     Blisters, hallucinations   Tramadol     Blisters, hallucinations   Trazodone And Nefazodone Other (See Comments)    hallucinations     PHYSICAL EXAM:  ECOG Performance status: 2  There were no vitals filed for this visit. There were no vitals filed for this visit.  Physical Exam Constitutional:      Appearance: Normal appearance.  HENT:     Head: Normocephalic.     Mouth/Throat:     Mouth: Mucous membranes are moist.     Pharynx: Oropharynx is clear.  Eyes:     Extraocular Movements: Extraocular movements intact.     Conjunctiva/sclera: Conjunctivae normal.  Cardiovascular:     Rate and Rhythm: Normal rate and regular rhythm.     Pulses: Normal pulses.     Heart sounds: Normal heart sounds.  Pulmonary:     Effort: Pulmonary effort is normal.     Breath sounds: Normal breath sounds.  Abdominal:     General: Bowel sounds are normal.     Palpations: Abdomen is soft.  Musculoskeletal: Normal range of motion.  Skin:    General: Skin is warm and dry.  Neurological:     General: No focal deficit present.     Mental Status: He is alert and oriented to person, place,  and time.  Psychiatric:        Mood and Affect: Mood normal.        Behavior: Behavior normal.        Thought Content: Thought content normal.        Judgment: Judgment normal.      LABORATORY DATA:  I  have reviewed the labs as listed.  CBC    Component Value Date/Time   WBC 6.9 10/25/2018 0807   RBC 4.16 (L) 10/25/2018 0807   HGB 13.0 10/25/2018 0807   HGB 11.6 (L) 02/04/2011 1059   HCT 39.7 10/25/2018 0807   HCT 34.7 (L) 02/04/2011 1059   PLT 366 10/25/2018 0807   PLT 282 02/04/2011 1059   MCV 95.4 10/25/2018 0807   MCV 76.0 (L) 02/04/2011 1059   MCH 31.3 10/25/2018 0807   MCHC 32.7 10/25/2018 0807   RDW 14.3 10/25/2018 0807   RDW 18.5 (H) 02/04/2011 1059   LYMPHSABS 1.9 10/25/2018 0807   LYMPHSABS 1.1 02/04/2011 1059   MONOABS 0.6 10/25/2018 0807   MONOABS 0.5 02/04/2011 1059   EOSABS 0.2 10/25/2018 0807   EOSABS 0.1 02/04/2011 1059   BASOSABS 0.1 10/25/2018 0807   BASOSABS 0.1 02/04/2011 1059   CMP Latest Ref Rng & Units 10/25/2018 10/18/2018 10/17/2018  Glucose 70 - 99 mg/dL 137(H) 160(H) 97  BUN 8 - 23 mg/dL _0 Creatinine 0.61 - 1.24 mg/dL 0.86 0.76 0.74  Sodium 135 - 145 mmol/L 139 140 137  Potassium 3.5 - 5.1 mmol/L 4.0 4.0 4.1  Chloride 98 - 111 mmol/L 102 105 103  CO2 22 - 32 mmol/L _1 Calcium 8.9 - 10.3 mg/dL 8.8(L) 8.9 8.1(L)  Total Protein 6.5 - 8.1 g/dL 6.6 6.1(L) -  Total Bilirubin 0.3 - 1.2 mg/dL 0.8 1.1 -  Alkaline Phos 38 - 126 U/L 84 72 -  AST 15 - 41 U/L 20 17 -  ALT 0 - 44 U/L 17 18 -       ASSESSMENT & PLAN:   Rectal cancer 1.  Stage IV rectal cancer with lung and pancreatic metastasis: -12 cycles of FOLFOX in the adjuvant setting from October 2012 through 07/14/2011. -15 cycles of FOLFOX and bevacizumab from 07/08/2016 through 01/20/2017, followed by infusional 5-FU and bevacizumab until 07/14/2017 -Maintenance Xeloda started on 08/07/2017 with bevacizumab, dose reduced to 1500 mg twice daily during cycle 2.  Prior to that he was on 5-FU maintenance. -CT CAP on 07/30/2018 showed no significant progressive metastatic disease.  No significant change in the right upper lobe solid pulmonary nodules.  Stable mixed lytic and sclerotic lower  sternal lesion.  Stable malignant pancreatic head mass. -MRCP showed intrahepatic and extrahepatic biliary dilatation with stricture at common bile duct.  Mass in the head of the pancreas has increased in size to 2.1 cm, previously 1.7 cm. -ERCP and stent placement on 09/16/2018, showing a single localized biliary stricture.  Biliary sphincterotomy was also performed and stricture dilated to 6 mm with the balloon to facilitate stent placement.  1 covered metal stent was placed. -Flexible sigmoidoscopy on 09/24/2018 shows narrowing of the colorectal anastomosis. - Transverse loop colostomy on 09/29/2018. -Cycle 1 of FOLFIRI on 10/04/2018.   -He was admitted from 10/16/2018 through 10/17/2018 for nausea and dry heaves.  He was emptying his bag 2 times a day but it was more liquidy.  He normally empties the bag twice a day.  No cramping reported.  He felt very tired after Cycle 1.  -  Regimen dose reduced by 20%. Neulasta added  to the regimen.  - Completed Cycle 2 10/25/18. Continue to hold bevacizumab until 28 days post- op. Plan to add to Cycle 3.  -Today, we will check a CBC CMP and mag to evaluate blood and electrolyte status and provide supplementation as indicated.  We will also administer 1 L of fluid.   -He will return tomorrow for additional fluids.  2.  Peripheral neuropathy: -We will continue gabapentin 900 mg twice daily.   3.  DVT and PE: -Diagnosed in 2016, status post IVC filter. -Xarelto was temporarily held.  He started back on it.  4.  Rectal pain: -He will continue hydrocodone as needed.  5.  Sleep problems: -Trazodone 100 mg tablet at bedtime is helping.  6.  Decreased appetite: -Currently on Megace 5 mg twice daily.        Orders placed this encounter:  No orders of the defined types were placed in this encounter.     Roger Shelter, [place] [phone]

## 2018-10-28 NOTE — Progress Notes (Signed)
Patient's wife, Hassan Rowan, called clinic reporting that patient is weak, unable to get up, no able to eat, dry heaving.  I advised her on directions for taking compazine that is prescribed to him.  I have also made an appointment for him to see our nurse practitioner and possible fluid administration today.  His wife verbalizes understanding. They are on their way in.

## 2018-10-29 ENCOUNTER — Other Ambulatory Visit: Payer: Self-pay

## 2018-10-29 ENCOUNTER — Inpatient Hospital Stay (HOSPITAL_COMMUNITY): Payer: Medicare Other

## 2018-10-29 ENCOUNTER — Encounter (HOSPITAL_COMMUNITY): Payer: Self-pay

## 2018-10-29 VITALS — BP 105/57 | HR 87 | Temp 98.3°F | Resp 18

## 2018-10-29 DIAGNOSIS — C7801 Secondary malignant neoplasm of right lung: Secondary | ICD-10-CM | POA: Diagnosis not present

## 2018-10-29 DIAGNOSIS — R197 Diarrhea, unspecified: Secondary | ICD-10-CM

## 2018-10-29 DIAGNOSIS — R112 Nausea with vomiting, unspecified: Secondary | ICD-10-CM

## 2018-10-29 DIAGNOSIS — C20 Malignant neoplasm of rectum: Secondary | ICD-10-CM | POA: Diagnosis not present

## 2018-10-29 DIAGNOSIS — Z5111 Encounter for antineoplastic chemotherapy: Secondary | ICD-10-CM | POA: Diagnosis not present

## 2018-10-29 DIAGNOSIS — C7802 Secondary malignant neoplasm of left lung: Secondary | ICD-10-CM | POA: Diagnosis not present

## 2018-10-29 DIAGNOSIS — E860 Dehydration: Secondary | ICD-10-CM

## 2018-10-29 DIAGNOSIS — G893 Neoplasm related pain (acute) (chronic): Secondary | ICD-10-CM | POA: Diagnosis not present

## 2018-10-29 DIAGNOSIS — C7889 Secondary malignant neoplasm of other digestive organs: Secondary | ICD-10-CM | POA: Diagnosis not present

## 2018-10-29 MED ORDER — SODIUM CHLORIDE 0.9% FLUSH
10.0000 mL | Freq: Once | INTRAVENOUS | Status: AC | PRN
  Administered 2018-10-29: 10:00:00 10 mL

## 2018-10-29 MED ORDER — SODIUM CHLORIDE 0.9 % IV SOLN
Freq: Once | INTRAVENOUS | Status: AC
  Administered 2018-10-29: 10:00:00 via INTRAVENOUS
  Filled 2018-10-29: qty 1000

## 2018-10-29 MED ORDER — SCOPOLAMINE 1 MG/3DAYS TD PT72: 1.0000 | MEDICATED_PATCH | TRANSDERMAL | 12 refills | Status: DC

## 2018-10-29 MED ORDER — HEPARIN SOD (PORK) LOCK FLUSH 100 UNIT/ML IV SOLN
500.0000 [IU] | Freq: Once | INTRAVENOUS | Status: AC | PRN
  Administered 2018-10-29: 12:00:00 500 [IU]

## 2018-10-29 NOTE — Progress Notes (Signed)
Charles [name] tolerated IV hydration with magnesium and potassium well without complaints or incident.Scopolamine patches sent in to pt's pharmacy per R. Nester NP and pt and wife given instructions on how to use with understanding verbalized. VSS upon discharge. Pt discharged via wheelchair in satisfactory condition accompanied by his wife

## 2018-10-29 NOTE — Patient Instructions (Signed)
[place] at [facility] Discharge Instructions  Received IV hydration with magnesium and potassium today. Follow-up as scheduled. Call clinic for any questions or concerns   Thank you for choosing [place] at [facility] to provide your oncology and hematology care.  To afford each patient quality time with our provider, please arrive at least 15 minutes before your scheduled appointment time.   If you have a lab appointment with the [facility] please come in thru the  Main Entrance and check in at the main information desk  You need to re-schedule your appointment should you arrive 10 or more minutes late.  We strive to give you quality time with our providers, and arriving late affects you and other patients whose appointments are after yours.  Also, if you no show three or more times for appointments you may be dismissed from the clinic at the providers discretion.     Again, thank you for choosing [facility].  Our hope is that these requests will decrease the amount of time that you wait before being seen by our physicians.       _____________________________________________________________  Should you have questions after your visit to [facility], please contact our office at [phone] between the hours of 8:00 a.m. and 4:30 p.m.  Voicemails left after 4:00 p.m. will not be returned until the following business day.  For prescription refill requests, have your pharmacy contact our office and allow 72 hours.    Cancer Center Support Programs:   > Cancer Support Group  2nd Tuesday of the month 1pm-2pm, Journey Room

## 2018-10-31 DIAGNOSIS — K9189 Other postprocedural complications and disorders of digestive system: Secondary | ICD-10-CM | POA: Diagnosis not present

## 2018-11-01 ENCOUNTER — Ambulatory Visit (HOSPITAL_COMMUNITY): Payer: Medicare Other

## 2018-11-01 ENCOUNTER — Other Ambulatory Visit (HOSPITAL_COMMUNITY): Payer: Medicare Other

## 2018-11-01 ENCOUNTER — Ambulatory Visit (HOSPITAL_COMMUNITY): Payer: Medicare Other | Admitting: Hematology

## 2018-11-01 DIAGNOSIS — T451X5S Adverse effect of antineoplastic and immunosuppressive drugs, sequela: Secondary | ICD-10-CM | POA: Diagnosis not present

## 2018-11-01 DIAGNOSIS — Z86718 Personal history of other venous thrombosis and embolism: Secondary | ICD-10-CM | POA: Diagnosis not present

## 2018-11-01 DIAGNOSIS — Z7984 Long term (current) use of oral hypoglycemic drugs: Secondary | ICD-10-CM | POA: Diagnosis not present

## 2018-11-01 DIAGNOSIS — G620 Drug-induced polyneuropathy: Secondary | ICD-10-CM | POA: Diagnosis not present

## 2018-11-01 DIAGNOSIS — Z95828 Presence of other vascular implants and grafts: Secondary | ICD-10-CM | POA: Diagnosis not present

## 2018-11-01 DIAGNOSIS — Z902 Acquired absence of lung [part of]: Secondary | ICD-10-CM | POA: Diagnosis not present

## 2018-11-01 DIAGNOSIS — Z85048 Personal history of other malignant neoplasm of rectum, rectosigmoid junction, and anus: Secondary | ICD-10-CM | POA: Diagnosis not present

## 2018-11-01 DIAGNOSIS — C250 Malignant neoplasm of head of pancreas: Secondary | ICD-10-CM | POA: Diagnosis not present

## 2018-11-01 DIAGNOSIS — Z433 Encounter for attention to colostomy: Secondary | ICD-10-CM | POA: Diagnosis not present

## 2018-11-01 DIAGNOSIS — Z85118 Personal history of other malignant neoplasm of bronchus and lung: Secondary | ICD-10-CM | POA: Diagnosis not present

## 2018-11-01 DIAGNOSIS — Z7901 Long term (current) use of anticoagulants: Secondary | ICD-10-CM | POA: Diagnosis not present

## 2018-11-01 DIAGNOSIS — E1151 Type 2 diabetes mellitus with diabetic peripheral angiopathy without gangrene: Secondary | ICD-10-CM | POA: Diagnosis not present

## 2018-11-01 DIAGNOSIS — K9189 Other postprocedural complications and disorders of digestive system: Secondary | ICD-10-CM | POA: Diagnosis not present

## 2018-11-01 DIAGNOSIS — Z86711 Personal history of pulmonary embolism: Secondary | ICD-10-CM | POA: Diagnosis not present

## 2018-11-02 ENCOUNTER — Encounter (HOSPITAL_COMMUNITY): Payer: Self-pay | Admitting: Hematology

## 2018-11-02 ENCOUNTER — Other Ambulatory Visit: Payer: Self-pay

## 2018-11-02 ENCOUNTER — Inpatient Hospital Stay (HOSPITAL_COMMUNITY): Payer: Medicare Other

## 2018-11-02 ENCOUNTER — Other Ambulatory Visit (HOSPITAL_COMMUNITY): Payer: Self-pay | Admitting: *Deleted

## 2018-11-02 ENCOUNTER — Inpatient Hospital Stay (HOSPITAL_BASED_OUTPATIENT_CLINIC_OR_DEPARTMENT_OTHER): Payer: Medicare Other | Admitting: Hematology

## 2018-11-02 VITALS — BP 98/55 | HR 91 | Temp 98.1°F | Resp 18

## 2018-11-02 DIAGNOSIS — G629 Polyneuropathy, unspecified: Secondary | ICD-10-CM

## 2018-11-02 DIAGNOSIS — E119 Type 2 diabetes mellitus without complications: Secondary | ICD-10-CM

## 2018-11-02 DIAGNOSIS — G893 Neoplasm related pain (acute) (chronic): Secondary | ICD-10-CM | POA: Diagnosis not present

## 2018-11-02 DIAGNOSIS — C7889 Secondary malignant neoplasm of other digestive organs: Secondary | ICD-10-CM | POA: Diagnosis not present

## 2018-11-02 DIAGNOSIS — G479 Sleep disorder, unspecified: Secondary | ICD-10-CM | POA: Diagnosis not present

## 2018-11-02 DIAGNOSIS — E860 Dehydration: Secondary | ICD-10-CM | POA: Diagnosis not present

## 2018-11-02 DIAGNOSIS — C7802 Secondary malignant neoplasm of left lung: Secondary | ICD-10-CM

## 2018-11-02 DIAGNOSIS — Z86718 Personal history of other venous thrombosis and embolism: Secondary | ICD-10-CM | POA: Diagnosis not present

## 2018-11-02 DIAGNOSIS — C20 Malignant neoplasm of rectum: Secondary | ICD-10-CM

## 2018-11-02 DIAGNOSIS — Z86711 Personal history of pulmonary embolism: Secondary | ICD-10-CM | POA: Diagnosis not present

## 2018-11-02 DIAGNOSIS — C7801 Secondary malignant neoplasm of right lung: Secondary | ICD-10-CM | POA: Diagnosis not present

## 2018-11-02 DIAGNOSIS — Z933 Colostomy status: Secondary | ICD-10-CM

## 2018-11-02 DIAGNOSIS — I10 Essential (primary) hypertension: Secondary | ICD-10-CM | POA: Diagnosis not present

## 2018-11-02 DIAGNOSIS — Z7901 Long term (current) use of anticoagulants: Secondary | ICD-10-CM

## 2018-11-02 DIAGNOSIS — R112 Nausea with vomiting, unspecified: Secondary | ICD-10-CM

## 2018-11-02 DIAGNOSIS — C7800 Secondary malignant neoplasm of unspecified lung: Secondary | ICD-10-CM

## 2018-11-02 DIAGNOSIS — Z5111 Encounter for antineoplastic chemotherapy: Secondary | ICD-10-CM | POA: Diagnosis not present

## 2018-11-02 DIAGNOSIS — Z79899 Other long term (current) drug therapy: Secondary | ICD-10-CM

## 2018-11-02 DIAGNOSIS — Z7984 Long term (current) use of oral hypoglycemic drugs: Secondary | ICD-10-CM

## 2018-11-02 LAB — COMPREHENSIVE METABOLIC PANEL
ALT: 17 U/L (ref 0–44)
AST: 17 U/L (ref 15–41)
Albumin: 3.7 g/dL (ref 3.5–5.0)
Alkaline Phosphatase: 117 U/L (ref 38–126)
Anion gap: 10 (ref 5–15)
BUN: 13 mg/dL (ref 8–23)
CO2: 26 mmol/L (ref 22–32)
Calcium: 9.1 mg/dL (ref 8.9–10.3)
Chloride: 100 mmol/L (ref 98–111)
Creatinine, Ser: 0.83 mg/dL (ref 0.61–1.24)
GFR calc Af Amer: >60 mL/min (ref >60)
GFR calc non Af Amer: >60 mL/min (ref >60)
Glucose, Bld: 181 mg/dL — ABNORMAL HIGH (ref 70–99)
Potassium: 4.6 mmol/L (ref 3.5–5.1)
Sodium: 136 mmol/L (ref 135–145)
Total Bilirubin: 1.8 mg/dL — ABNORMAL HIGH (ref 0.3–1.2)
Total Protein: 6.7 g/dL (ref 6.5–8.1)

## 2018-11-02 LAB — CBC WITH DIFFERENTIAL/PLATELET
Abs Immature Granulocytes: 0.21 10*3/uL — ABNORMAL HIGH (ref 0.00–0.07)
Basophils Absolute: 0.1 10*3/uL (ref 0.0–0.1)
Basophils Relative: 1 %
Eosinophils Absolute: 0.2 10*3/uL (ref 0.0–0.5)
Eosinophils Relative: 2 %
HCT: 38.8 % — ABNORMAL LOW (ref 39.0–52.0)
Hemoglobin: 12.9 g/dL — ABNORMAL LOW (ref 13.0–17.0)
Immature Granulocytes: 2 %
Lymphocytes Relative: 14 %
Lymphs Abs: 1.4 10*3/uL (ref 0.7–4.0)
MCH: 31.4 pg (ref 26.0–34.0)
MCHC: 33.2 g/dL (ref 30.0–36.0)
MCV: 94.4 fL (ref 80.0–100.0)
Monocytes Absolute: 0.8 10*3/uL (ref 0.1–1.0)
Monocytes Relative: 8 %
Neutro Abs: 7 10*3/uL (ref 1.7–7.7)
Neutrophils Relative %: 73 %
Platelets: 197 10*3/uL (ref 150–400)
RBC: 4.11 MIL/uL — ABNORMAL LOW (ref 4.22–5.81)
RDW: 14.4 % (ref 11.5–15.5)
WBC: 9.6 10*3/uL (ref 4.0–10.5)
nRBC: 0 % (ref 0.0–0.2)

## 2018-11-02 MED ORDER — SODIUM CHLORIDE 0.9% FLUSH
10.0000 mL | Freq: Once | INTRAVENOUS | Status: AC | PRN
  Administered 2018-11-02: 10 mL

## 2018-11-02 MED ORDER — SODIUM CHLORIDE 0.9 % IV SOLN
Freq: Once | INTRAVENOUS | Status: AC
  Administered 2018-11-02: 12:00:00 via INTRAVENOUS
  Filled 2018-11-02: qty 1000

## 2018-11-02 MED ORDER — HEPARIN SOD (PORK) LOCK FLUSH 100 UNIT/ML IV SOLN
500.0000 [IU] | Freq: Once | INTRAVENOUS | Status: AC | PRN
  Administered 2018-11-02: 500 [IU]

## 2018-11-02 MED ORDER — MEGESTROL ACETATE 400 MG/10ML PO SUSP: 400.0000 mg | Freq: Two times a day (BID) | ORAL | 2 refills | Status: DC

## 2018-11-02 NOTE — Patient Instructions (Addendum)
[place] at [facility] Discharge Instructions  You were seen today by Dr. [name]. He went over your recent lab results. He will see you back as scheduled for labs and follow up.  STOP taking the Lisinopril. Start taking Klonopin 2 tablets in the morning and 2 tablets in the evening.  Thank you for choosing [place] at [facility] to provide your oncology and hematology care.  To afford each patient quality time with our provider, please arrive at least 15 minutes before your scheduled appointment time.   If you have a lab appointment with the [facility] please come in thru the  Main Entrance and check in at the main information desk  You need to re-schedule your appointment should you arrive 10 or more minutes late.  We strive to give you quality time with our providers, and arriving late affects you and other patients whose appointments are after yours.  Also, if you no show three or more times for appointments you may be dismissed from the clinic at the providers discretion.     Again, thank you for choosing [facility].  Our hope is that these requests will decrease the amount of time that you wait before being seen by our physicians.       _____________________________________________________________  Should you have questions after your visit to [place], please contact our office at [phone] between the hours of 8:00 a.m. and 4:30 p.m.  Voicemails left after 4:00 p.m. will not be returned until the following business day.  For prescription refill requests, have your pharmacy contact our office and allow 72 hours.    Cancer Center Support Programs:   > Cancer Support Group  2nd Tuesday of the month 1pm-2pm, Journey Room

## 2018-11-02 NOTE — Assessment & Plan Note (Addendum)
1.  Stage IV rectal cancer with lung and pancreatic metastasis: Foundation 1 CDX shows KRAS G13D and NRAS wild type, MS-stable. -12 cycles of FOLFOX in the adjuvant setting from October 12 through 07/14/2011. -15 cycles of FOLFOX and bevacizumab from 07/08/2016 through 01/20/2017 followed by infusional 5-FU and bevacizumab until 07/14/2017. -Maintenance Xeloda from 08/07/2017 with bevacizumab through 08/19/2018 with progression. - ERCP and stent placement on 09/16/2018 showing single localized biliary stricture.  Biliary sphincterotomy was performed with stricture dilated to 6 mm with balloon to facilitate stent placement.  1 covered metal stent was placed. -Flexible sigmoidoscopy on 09/24/2018 showing narrowing of the colorectal anastomosis.  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 - 2 cycles of FOLFIRI from 10/04/2018 through 10/25/2018 with 20% dose reduction during cycle 2. - He did experience nausea and dry heaves after cycle 2.  Appetite has severely decreased.  Colostomy bag is being drained couple of times a day.  He also reports dizziness.  Also complains of anxiety. - His blood pressure today is 96/65.  He will receive IV hydration. - I will discontinue lisinopril. -We will order UGT1A1 level today.  We will see him back next week.  2.  Weight loss: - He has tried mirtazapine.  He is currently on Marinol 5 mg twice daily which is not helping. -We will start him on Megace 10 mL twice daily.  We discussed about side effects in detail.  3.  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  He was diagnosed in 2016, status post IVC filter.  4.  Anxiety: -he is currently on Klonopin 0.5 mg as needed. -I have told him to increase it to 2 tablets twice daily. -If it is helping, we will send him new prescription.

## 2018-11-02 NOTE — Patient Instructions (Signed)
[facility] at [facility]  Discharge Instructions:   _______________________________________________________________  Thank you for choosing [facility] at [facility] to provide your oncology and hematology care.  To afford each patient quality time with our providers, please arrive at least 15 minutes before your scheduled appointment.  You need to re-schedule your appointment if you arrive 10 or more minutes late.  We strive to give you quality time with our providers, and arriving late affects you and other patients whose appointments are after yours.  Also, if you no show three or more times for appointments you may be dismissed from the clinic.  Again, thank you for choosing [facility] at [facility]. Our hope is that these requests will allow you access to exceptional care and in a timely manner. _______________________________________________________________  If you have questions after your visit, please contact our office at [phone] between the hours of 8:30 a.m. and 5:00 p.m. Voicemails left after 4:30 p.m. will not be returned until the following business day. _______________________________________________________________  For prescription refill requests, have your pharmacy contact our office. _______________________________________________________________  Recommendations made by the consultant and any test results will be sent to your referring physician. _______________________________________________________________ 

## 2018-11-02 NOTE — Progress Notes (Signed)
[place], [place] [address]   CLINIC:  Medical Oncology/Hematology  PCP:  [name], MD [place] [address] [phone]   REASON FOR VISIT:  Follow-up for Stage IV rectal cancer  CURRENT THERAPY: FOLFIRI  BRIEF ONCOLOGIC HISTORY:  Oncology History  Rectal cancer ([place])  07/24/2010 Initial Diagnosis   Invasive adenocarcinoma of rectum   09/09/2010 Concurrent Chemotherapy   S/P radiation and concurrent 5-FU continuous infusion from 09/09/10- 10/10/10.   11/14/2010 Surgery   S/P proctectomy with colorectal anastomosis and diverting loop ileostomy on 11/14/10 at [facility] by Dr. Harlon Ditty. Pathology reveals a pT3b N1 with 3/20 lymph nodes.   02/05/2011 - 07/14/2011 Chemotherapy   FOLFOX   08/18/2011 Surgery   Approximate date of surgery- [place] by Dr. Harlon Ditty    Remission     11/24/2013 Survivorship   Colonoscopy- somewhat fibrotic/friable anastomotic mucosal-status post biopsy (narrowing not felt to be clinically significant).  Negative pathology for malignancy   09/12/2014 Imaging   DVT in the left femoral venous system, left common iliac vein, IVC, and within the IVC filter Right lower extremity venography confirms chronic occlusion of the femoral venous system with collateralization. The right iliac venous system is patent and do   09/25/2014 PET scan   The right middle lobe pulmonary nodule is hypermetabolic, favored to represent a primary bronchogenic carcinoma.Equivocal mediastinal nodes, similar to surrounding blood pool. Bilateral adrenal hypermetabolism, felt to be physiologic   10/24/2014 Pathology Results   Lung, needle/core biopsy(ies), RML - ADENOCARCINOMA, SEE COMMENT metastatic adenocarcinoma of a colorectal primary   10/24/2014 Relapse/Recurrence     11/16/2014 Definitive Surgery   Bronchoscopy, right video-assisted thoracoscopy, wedge resection of right middle lobe by Dr. Servando Snare   11/16/2014  Pathology Results   Lung, wedge biopsy/resection, right lingula and small portion of middle lobe - METASTATIC ADENOCARCINOMA, CONSISTENT WITH COLORECTAL PRIMARY, SPANNING 2.0 CM. - THE SURGICAL RESECTION MARGINS ARE NEGATIVE FOR ADENOCARCINOMA.   11/16/2014 Remission   THE SURGICAL RESECTION MARGINS ARE NEGATIVE FOR ADENOCARCINOMA.   03/07/2015 Imaging   CT CAP- Interval resection of right middle lobe metastasis. No acute process or evidence of metastatic disease in the chest, abdomen or pelvis. Improved right upper lobe reticular nodular opacities are favored to represent resolving infection.   09/14/2015 Imaging   CT CAP- No findings of recurrent malignancy. No recurrence along the wedge resection site of the right middle lobe. Right anterior abdominal wall focal hernia containing a knuckle of small bowel without complicating feature.   06/13/2016 Imaging   CT CAP- 1. New right-sided pleural metastasis/mass. Other smaller right-sided pulmonary nodules which are all pleural-based and most likely represent pleural metastasis. 2. No convincing evidence of abdominopelvic nodal metastasis. 3. Constellation of findings, including pancreatic atrophy, duct dilatation, and pancreatic head soft tissue fullness which are highly suspicious for pancreatic adenocarcinoma. Metastatic disease felt much less likely. Consider endoscopic ultrasound sampling or ERCP. Cannot exclude superimposed acute pancreatitis. 4. New enlargement of the appendiceal tip with subtle surrounding edema. Cannot exclude early or mild appendicitis. 5. Coronary artery atherosclerosis. Aortic atherosclerosis. 6. 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   06/13/2016 Progression   CT scan demonstrates progression of disease   06/19/2016 Procedure   EUS with FNA by Dr. Ardis Hughs   06/20/2016 Pathology Results   FINE NEEDLE ASPIRATION, ENDOSCOPIC, PANCREAS UNCINATE AREA(SPECIMEN 1 OF 1 COLLECTED 06/19/16):  MALIGNANT CELLS CONSISTENT WITH METASTATIC ADENOCARCINOMA.  06/25/2016 PET scan   1. Two pleural-based nodules in the right hemithorax are hypermetabolic. Metastatic disease is a distinct consideration. The scattered pulmonary parenchymal nodule seen on previous diagnostic CT imaging are below the threshold for reliable resolution on PET imaging. 2. Hypermetabolic lesion pancreatic head, consistent with neoplasm. As noted on prior CT, adenocarcinoma is any consideration. No evidence for hypermetabolic abdominal lymphadenopathy. Mottled uptake noted in the liver, but no discrete hepatic metastases are evident on PET imaging. 3. Appendix remains distended up to 0.9-10 mm diameter with a stone towards the tip in subtle periappendiceal edema/inflammation. The appendix is hypermetabolic along its length. Imaging features are relatively stable in the 12 day interval since prior CT scan. While appendicitis is a consideration, the relative stability over 12 days would be unusual for that etiology. Continued close follow-up recommended.   06/26/2016 Pathology Results   Not enough tissue for foundationONE or K-ras testing.   07/07/2016 Procedure   Port placed by Dr. Arnoldo Morale   07/08/2016 -  Chemotherapy   The patient had pegfilgrastim (NEULASTA) injection 6 mg, 6 mg, Subcutaneous,  Once, 0 of 4 cycles  ondansetron (ZOFRAN) IVPB 8 mg, 8 mg, Intravenous,  Once, 0 of 4 cycles  leucovorin 804 mg in dextrose 5 % 250 mL infusion, 400 mg/m2, Intravenous,  Once, 0 of 4 cycles  oxaliplatin (ELOXATIN) 170 mg in dextrose 5 % 500 mL chemo infusion, 85 mg/m2, Intravenous,  Once, 0 of 4 cycles  fluorouracil (ADRUCIL) 4,800 mg in sodium chloride 0.9 % 150 mL chemo infusion, 2,400 mg/m2 = 4,800 mg, Intravenous, 1 Day/Dose, 0 of 4 cycles  fluorouracil (ADRUCIL) chemo injection 800 mg, 400 mg/m2, Intravenous,  Once, 0 of 4 cycles  pegfilgrastim (NEULASTA) injection 6 mg, 6 mg, Subcutaneous,  Once, 2 of 2  cycles  ondansetron (ZOFRAN) IVPB 8 mg, 8 mg, Intravenous,  Once, 12 of 12 cycles  leucovorin 660 mg in dextrose 5 % 250 mL infusion, 660 mg (original dose ), Intravenous,  Once, 12 of 12 cycles Dose modification: 660 mg (Cycle 1)  oxaliplatin (ELOXATIN) 140 mg in dextrose 5 % 500 mL chemo infusion, 140 mg (original dose ), Intravenous,  Once, 12 of 12 cycles Dose modification: 140 mg (Cycle 1), 119 mg (85 % of original dose 140 mg, Cycle 8, Reason: Provider Judgment)  fluorouracil (ADRUCIL) 3,850 mg in sodium chloride 0.9 % 150 mL chemo infusion, 3,850 mg (original dose ), Intravenous, 1 Day/Dose, 12 of 12 cycles Dose modification: 3,850 mg (Cycle 1)  fluorouracil (ADRUCIL) chemo injection 700 mg, 700 mg (original dose ), Intravenous,  Once, 12 of 12 cycles Dose modification: 700 mg (Cycle 1)  palonosetron (ALOXI) injection 0.25 mg, 0.25 mg, Intravenous,  Once, 7 of 12 cycles Administration: 0.25 mg (09/30/2016)  bevacizumab (AVASTIN) 475 mg in sodium chloride 0.9 % 100 mL chemo infusion, 5 mg/kg = 475 mg, Intravenous,  Once, 6 of 11 cycles Administration: 500 mg (09/30/2016)  leucovorin 880 mg in dextrose 5 % 250 mL infusion, 400 mg/m2 = 880 mg, Intravenous,  Once, 7 of 12 cycles Administration: 880 mg (09/30/2016)  oxaliplatin (ELOXATIN) 185 mg in dextrose 5 % 500 mL chemo infusion, 85 mg/m2 = 185 mg, Intravenous,  Once, 7 of 12 cycles Administration: 185 mg (09/30/2016)  fluorouracil (ADRUCIL) chemo injection 900 mg, 400 mg/m2 = 900 mg, Intravenous,  Once, 7 of 12 cycles Administration: 900 mg (09/30/2016)  fluorouracil (ADRUCIL) 5,300 mg in sodium chloride 0.9 % 144 mL chemo infusion, 2,400 mg/m2 = 5,300  mg, Intravenous, 1 Day/Dose, 7 of 12 cycles Administration: 5,300 mg (09/30/2016)  for chemotherapy treatment.     08/27/2016 Imaging   CT CAP- 1. Pulmonary metastatic disease is stable. 2. Pancreatic head mass, grossly stable, with progressive atrophy of the body and tail of the  pancreas. Stable peripancreatic lymph node. 3. Mild circumferential rectal wall thickening, stable. 4. Aortic atherosclerosis (ICD10-170.0). Coronary artery calcification. 5. Hyperattenuating lesion off the lower pole left kidney, stable, too small to characterize. Continued attention on followup exams is warranted. 6. Persistent wall thickening and mild dilatation of the proximal appendix, of uncertain etiology. Continued attention on followup exams is warranted.   11/06/2016 Imaging   CT C/A/P: IMPRESSION: 1. Stable pulmonary metastatic disease. 2. Similar appearing pancreatic head mass. Progressed atrophy of the pancreatic body and tail. Stable peripancreatic lymph node. 3. Stable hypoattenuating lesion partially exophytic off the inferior pole of the left kidney. 4. Aortic atherosclerosis. 5. Persistent mild wall thickening and mild dilatation of the appendix.    01/30/2017 Imaging   Ct C/A/P: IMPRESSION: 1. Stable pulmonary metastatic disease. 2. Stable to slight increase in size of pancreatic head mass. 3.  Aortic Atherosclerosis (ICD10-I70.0). 4. Similar appearance of mild wall thickening of the appendix which contains an appendicolith.   05/01/2017 Imaging   CT C/A/P: Stable disease in the lungs, no progression of disease in the abdomen.   10/04/2018 -  Chemotherapy   The patient had palonosetron (ALOXI) injection 0.25 mg, 0.25 mg, Intravenous,  Once, 2 of 4 cycles Administration: 0.25 mg (10/04/2018), 0.25 mg (10/25/2018) pegfilgrastim (NEULASTA) injection 6 mg, 6 mg, Subcutaneous, Once, 1 of 3 cycles Administration: 6 mg (10/27/2018) bevacizumab (AVASTIN) 450 mg in sodium chloride 0.9 % 100 mL chemo infusion, 5 mg/kg = 450 mg, Intravenous,  Once, 1 of 3 cycles irinotecan (CAMPTOSAR) 380 mg in sodium chloride 0.9 % 500 mL chemo infusion, 180 mg/m2 = 380 mg, Intravenous,  Once, 2 of 4 cycles Dose modification: 144 mg/m2 (original dose 180 mg/m2, Cycle 2, Reason: Provider  Judgment, Comment: 20% dose reduction) Administration: 380 mg (10/04/2018), 300 mg (10/25/2018) leucovorin 900 mg in sodium chloride 0.9 % 250 mL infusion, 421 mg/m2 = 856 mg, Intravenous,  Once, 2 of 4 cycles Dose modification: 320 mg/m2 (original dose 400 mg/m2, Cycle 2, Reason: Provider Judgment, Comment: 20% dose reduction) Administration: 900 mg (10/04/2018), 700 mg (10/25/2018) fluorouracil (ADRUCIL) chemo injection 850 mg, 400 mg/m2 = 850 mg, Intravenous,  Once, 2 of 4 cycles Dose modification: 320 mg/m2 (original dose 400 mg/m2, Cycle 2, Reason: Provider Judgment, Comment: 20% dose reduction) Administration: 850 mg (10/04/2018), 700 mg (10/25/2018) fluorouracil (ADRUCIL) 5,000 mg in sodium chloride 0.9 % 150 mL chemo infusion, 2,325 mg/m2 = 5,150 mg, Intravenous, 1 Day/Dose, 2 of 4 cycles Dose modification: 1,920 mg/m2 (original dose 2,400 mg/m2, Cycle 2, Reason: Provider Judgment, Comment: 20% dose reduction) Administration: 5,000 mg (10/04/2018), 4,100 mg (10/25/2018)  for chemotherapy treatment.       CANCER STAGING: Cancer Staging Rectal cancer [facility]) Staging form: Colon and Rectum, AJCC 7th Edition - Clinical: Stage IIIB (T3, N1, M0) - Signed by [name], MD on 02/04/2011 - Pathologic stage from 11/17/2014: Stage IVA (M1a) - Signed by Baird Cancer, PA-C on 09/14/2015    INTERVAL HISTORY:  Mr. Meloche 68 y.o. male seen for follow-up with his wife.  He received chemotherapy on 10/25/2018 with dose reduction.  He reported nausea, dry heaves with no vomiting.  Severely decreased appetite and fatigue was reported.  He  is also very anxious.  Currently takes Klonopin 0.5 mg twice daily which is not helping.  He is emptying his colostomy bag twice daily.  Today the stool is more liquidy.  He also feels lightheaded when stands up.  Denies any fevers or chills.   REVIEW OF SYSTEMS:  Review of Systems  Constitutional: Positive for appetite change and fatigue.  HENT:  Negative.    Eyes: Negative.   Respiratory: Negative.   Cardiovascular: Negative.   Gastrointestinal: Positive for nausea.  Genitourinary: Negative.    Musculoskeletal: Negative.   Skin: Negative.   Neurological: Positive for dizziness.  Hematological: Negative.   Psychiatric/Behavioral: The patient is nervous/anxious.   All other systems reviewed and are negative.    PAST MEDICAL/SURGICAL HISTORY:  Past Medical History:  Diagnosis Date  . Chronic anticoagulation   . Colon cancer ([place]) 07/24/2010   rectal ca, inv adenocarcinoma  . Depression 04/01/2011  . Diabetes mellitus without complication ([place])   . DVT (deep venous thrombosis) ([place]) 05/09/2011  . GERD (gastroesophageal reflux disease)   . High output ileostomy ([place]) 05/21/2011  . History of kidney stones   . HTN (hypertension)   . Hx of radiation therapy 09/02/10 to 10/14/10   pelvis  . Lung metastasis ([place])   . Neuropathy   . Peripheral vascular disease ([place])    dvt's,pe  . Pneumonia    hx x3  . Pulmonary embolism ([place])   . Rectal cancer ([place]) 01/15/2011   S/P radiation and concurrent 5-FU continuous infusion from 09/09/10- 10/10/10.  S/P proctectomy with colorectal anastomosis and diverting loop ileostomy on 11/14/10 at [facility] by Dr. Harlon Ditty. Pathology reveals a pT3b N1 with 3/20 lymph nodes.     Past Surgical History:  Procedure Laterality Date  . BILIARY STENT PLACEMENT N/A 09/16/2018   Procedure: STENT PLACEMENT;  Surgeon: Rogene Houston, MD;  [facility];  Service: Endoscopy;  Laterality: N/A;  . BIOPSY  09/24/2018   Procedure: BIOPSY;  Surgeon: Danie Binder, MD;  [facility];  Service: Endoscopy;;  . COLON SURGERY  11/14/2010   proctectomy with colorectal anastomosis and diverting loop ileostomy (temporary planned)  . COLONOSCOPY  07/2010   proximal rectal apple core mass 10-14cm from anal verge (adenocarcinoma), 2-3cm distal rectal carpet polyp s/p piecemeal snare polypectomy (adenoma)  .  COLONOSCOPY  04/21/2012   RMR: Friable,fibrotic appearing colorectal anastomosis producing some luminal narrowing-not felt to be critical. path: focal erosion with slight inflammation and hyperemia. SURVEILLANCE DUE DEC 2015  . COLONOSCOPY N/A 11/24/2013   Dr. Rourk:somewhat fibrotic/friable anastomotic mucosal-status post biopsy (narrowing not felt to be clinically significant) Single colonic diverticulum. benign polypoid rectal mucosa  . colostomy reversal  april 2013  . ERCP N/A 09/16/2018   Procedure: ENDOSCOPIC RETROGRADE CHOLANGIOPANCREATOGRAPHY (ERCP);  Surgeon: Rogene Houston, MD;  [facility];  Service: Endoscopy;  Laterality: N/A;  . ESOPHAGOGASTRODUODENOSCOPY  07/2010   RMR: schatki ring s/p dilation, small hh, SB bx benign  . ESOPHAGOGASTRODUODENOSCOPY N/A 09/04/2015   Procedure: ESOPHAGOGASTRODUODENOSCOPY (EGD);  Surgeon: Daneil Dolin, MD;  [facility];  Service: Endoscopy;  Laterality: N/A;  730  . EUS  08/2010   Dr. Owens Loffler. uT3N0 circumferential, nearly obstruction rectosigmoid adenocarcinoma, distal edge 12cm from anal verge  . EUS N/A 06/19/2016   Procedure: UPPER ENDOSCOPIC ULTRASOUND (EUS) RADIAL;  Surgeon: Milus Banister, MD;  Location: [facility];  Service: Endoscopy;  Laterality: N/A;  . FLEXIBLE SIGMOIDOSCOPY N/A 09/24/2018  Procedure: FLEXIBLE SIGMOIDOSCOPY;  Surgeon: Danie Binder, MD;  [facility];  Service: Endoscopy;  Laterality: N/A;  . HERNIA REPAIR     abd hernia repair  . IVC filter    . ivc filter    . port a cath placement    . PORT-A-CATH REMOVAL  09/24/2011   Procedure: REMOVAL PORT-A-CATH;  Surgeon: Donato Heinz, MD;  Location: [facility];  Service: General;  Laterality: N/A;  Minor Room  . PORTACATH PLACEMENT Right 07/07/2016   Procedure: INSERTION PORT-A-CATH;  Surgeon: Aviva Signs, MD;  Location: [facility];  Service: General;  Laterality: Right;  . SPHINCTEROTOMY N/A 09/16/2018   Procedure: SPHINCTEROTOMY with  balloon dialation;  Surgeon: Rogene Houston, MD;  [facility];  Service: Endoscopy;  Laterality: N/A;  . TRANSVERSE LOOP COLOSTOMY N/A 09/29/2018   Procedure: TRANSVERSE LOOP COLOSTOMY;  Surgeon: Aviva Signs, MD;  Location: [facility];  Service: General;  Laterality: N/A;  . VIDEO ASSISTED THORACOSCOPY (VATS)/WEDGE RESECTION Right 11/15/2014   Procedure: VIDEO ASSISTED THORACOSCOPY (VATS)/LUNG RESECTION WITH RIGHT LINGULECTOMY;  Surgeon: [name], MD;  Location: [facility] [place];  Service: Thoracic;  Laterality: Right;  Marland Kitchen VIDEO BRONCHOSCOPY N/A 11/15/2014   Procedure: VIDEO BRONCHOSCOPY;  Surgeon: [name], MD;  Location: [facility] OR;  Service: Thoracic;  Laterality: N/A;     SOCIAL HISTORY:  Social History   Socioeconomic History  . Marital status: Married    Spouse name: Not on file  . Number of children: 2  . Years of education: Not on file  . Highest education level: Not on file  Occupational History  . Occupation: self-employed Regulatory affairs officer, currently not working    Fish farm manager: SELF EMPLOYED  Social Needs  . Financial resource strain: Not very hard  . Food insecurity    Worry: Never true    Inability: Never true  . Transportation needs    Medical: No    Non-medical: No  Tobacco Use  . Smoking status: Never Smoker  . Smokeless tobacco: Never Used  Substance and Sexual Activity  . Alcohol use: No  . Drug use: No  . Sexual activity: Yes    Birth control/protection: None    Comment: married  Lifestyle  . Physical activity    Days per week: 0 days    Minutes per session: 0 min  . Stress: Only a little  Relationships  . Social connections    Talks on phone: More than three times a week    Gets together: Once a week    Attends religious service: Patient refused    Active member of club or organization: Patient refused    Attends meetings of clubs or organizations: Patient refused    Relationship status: Patient refused  . Intimate partner  violence    Fear of current or ex partner: Patient refused    Emotionally abused: Patient refused    Physically abused: Patient refused    Forced sexual activity: Patient refused  Other Topics Concern  . Not on file  Social History Narrative  . Not on file    FAMILY HISTORY:  Family History  Problem Relation Age of Onset  . Cancer Brother        throat  . Cancer Brother        prostate  . Colon cancer Neg Hx   . Liver disease Neg Hx   . Inflammatory bowel disease Neg Hx     CURRENT MEDICATIONS:  Outpatient Encounter Medications as of  11/02/2018  Medication Sig  . acetaminophen (TYLENOL) 500 MG tablet Take 1,000 mg by mouth every 6 (six) hours as needed for mild pain or moderate pain.   . clonazePAM (KLONOPIN) 0.5 MG tablet Take 1 tablet (0.5 mg total) by mouth 2 (two) times daily as needed.  . Diphenhyd-Hydrocort-Nystatin (FIRST-DUKES MOUTHWASH) SUSP Use as directed 5 mLs in the mouth or throat 4 (four) times daily.  . diphenoxylate-atropine (LOMOTIL) 2.5-0.025 MG tablet Take 1 tablet by mouth 4 (four) times daily as needed for diarrhea or loose stools.  Marland Kitchen dronabinol (MARINOL) 5 MG capsule Take 1 capsule (5 mg total) by mouth 2 (two) times daily before a meal.  . gabapentin (NEURONTIN) 300 MG capsule Take 3 capsules (900 mg total) by mouth 2 (two) times daily.  Marland Kitchen glimepiride (AMARYL) 2 MG tablet Take 2 mg by mouth daily with breakfast.   . mirtazapine (REMERON) 7.5 MG tablet Take 1 tablet (7.5 mg total) by mouth at bedtime.  Marland Kitchen omeprazole (PRILOSEC) 20 MG capsule Take 20 mg by mouth 2 (two) times daily before a meal.   . ondansetron (ZOFRAN) 8 MG tablet Take 1 tablet (8 mg total) by mouth 2 (two) times daily as needed for refractory nausea / vomiting. Start on day 3 after chemotherapy.  Marland Kitchen oxyCODONE-acetaminophen (PERCOCET) 10-325 MG tablet Take 1 tablet by mouth every 6 (six) hours as needed for pain.  . Probiotic Product (PROBIOTIC DAILY PO) Take 1 capsule by mouth daily.  .  prochlorperazine (COMPAZINE) 10 MG tablet Take 1 tablet (10 mg total) by mouth every 6 (six) hours as needed for nausea or vomiting.  . prochlorperazine (COMPAZINE) 10 MG tablet   . promethazine (PHENERGAN) 25 MG tablet Take 1 tablet (25 mg total) by mouth every 8 (eight) hours as needed for refractory nausea / vomiting.  . rivaroxaban (XARELTO) 20 MG TABS tablet Take 1 tablet (20 mg total) by mouth daily with supper.  Marland Kitchen scopolamine (TRANSDERM-SCOP) 1 MG/3DAYS Place 1 patch (1.5 mg total) onto the skin every 3 (three) days.  Marland Kitchen scopolamine (TRANSDERM-SCOP) 1 MG/3DAYS   . traZODone (DESYREL) 100 MG tablet Take 1 tablet (100 mg total) by mouth at bedtime.  . vitamin B-12 (CYANOCOBALAMIN) 1000 MCG tablet Take 1,000 mcg by mouth daily.  . megestrol (MEGACE) 400 MG/10ML suspension Take 10 mLs (400 mg total) by mouth 2 (two) times daily.   Facility-Administered Encounter Medications as of 11/02/2018  Medication  . [COMPLETED] heparin lock flush 100 unit/mL  . [COMPLETED] sodium chloride 0.9 % 1,000 mL with potassium chloride 20 mEq, magnesium sulfate 2 g infusion  . sodium chloride 0.9 % injection 10 mL  . sodium chloride flush (NS) 0.9 % injection 10 mL  . [COMPLETED] sodium chloride flush (NS) 0.9 % injection 10 mL    ALLERGIES:  Allergies  Allergen Reactions  . Oxycodone     Blisters, hallucinations  . Tramadol     Blisters, hallucinations  . Trazodone And Nefazodone Other (See Comments)    hallucinations     PHYSICAL EXAM:  ECOG Performance status: 2  Vitals:   11/02/18 1136  BP: 96/65  Pulse: (!) 119  Resp: 20  Temp: (!) 97.1 F (36.2 C)  SpO2: 100%   Filed Weights   11/02/18 1136  Weight: 172 lb (78 kg)    Physical Exam Constitutional:      Appearance: Normal appearance.  HENT:     Head: Normocephalic.     Mouth/Throat:     Mouth: Mucous membranes  are moist.     Pharynx: Oropharynx is clear.  Eyes:     Extraocular Movements: Extraocular movements intact.      Conjunctiva/sclera: Conjunctivae normal.  Cardiovascular:     Rate and Rhythm: Normal rate and regular rhythm.     Pulses: Normal pulses.     Heart sounds: Normal heart sounds.  Pulmonary:     Effort: Pulmonary effort is normal.     Breath sounds: Normal breath sounds.  Abdominal:     General: Bowel sounds are normal.     Palpations: Abdomen is soft.  Musculoskeletal: Normal range of motion.  Skin:    General: Skin is warm and dry.  Neurological:     General: No focal deficit present.     Mental Status: He is alert and oriented to person, place, and time.  Psychiatric:        Mood and Affect: Mood normal.        Behavior: Behavior normal.        Thought Content: Thought content normal.        Judgment: Judgment normal.      LABORATORY DATA:  I have reviewed the labs as listed.  CBC    Component Value Date/Time   WBC 9.6 11/02/2018 1003   RBC 4.11 (L) 11/02/2018 1003   HGB 12.9 (L) 11/02/2018 1003   HGB 11.6 (L) 02/04/2011 1059   HCT 38.8 (L) 11/02/2018 1003   HCT 34.7 (L) 02/04/2011 1059   PLT 197 11/02/2018 1003   PLT 282 02/04/2011 1059   MCV 94.4 11/02/2018 1003   MCV 76.0 (L) 02/04/2011 1059   MCH 31.4 11/02/2018 1003   MCHC 33.2 11/02/2018 1003   RDW 14.4 11/02/2018 1003   RDW 18.5 (H) 02/04/2011 1059   LYMPHSABS 1.4 11/02/2018 1003   LYMPHSABS 1.1 02/04/2011 1059   MONOABS 0.8 11/02/2018 1003   MONOABS 0.5 02/04/2011 1059   EOSABS 0.2 11/02/2018 1003   EOSABS 0.1 02/04/2011 1059   BASOSABS 0.1 11/02/2018 1003   BASOSABS 0.1 02/04/2011 1059   CMP Latest Ref Rng & Units 11/02/2018 10/28/2018 10/25/2018  Glucose 70 - 99 mg/dL 181(H) 167(H) 137(H)  BUN 8 - 23 mg/dL _0 Creatinine 0.61 - 1.24 mg/dL 0.83 0.82 0.86  Sodium 135 - 145 mmol/L 136 133(L) 139  Potassium 3.5 - 5.1 mmol/L 4.6 3.7 4.0  Chloride 98 - 111 mmol/L 100 98 102  CO2 22 - 32 mmol/L _1 Calcium 8.9 - 10.3 mg/dL 9.1 8.7(L) 8.8(L)  Total Protein 6.5 - 8.1 g/dL 6.7 6.1(L) 6.6   Total Bilirubin 0.3 - 1.2 mg/dL 1.8(H) 1.6(H) 0.8  Alkaline Phos 38 - 126 U/L 117 95 84  AST 15 - 41 U/L _2 ALT 0 - 44 U/L _3 ASSESSMENT & PLAN:   Rectal cancer 1.  Stage IV rectal cancer with lung and pancreatic metastasis: Foundation 1 CDX shows KRAS G13D and NRAS wild type, MS-stable. -12 cycles of FOLFOX in the adjuvant setting from October 12 through 07/14/2011. -15 cycles of FOLFOX and bevacizumab from 07/08/2016 through 01/20/2017 followed by infusional 5-FU and bevacizumab until 07/14/2017. -Maintenance Xeloda from 08/07/2017 with bevacizumab through 08/19/2018 with progression. - ERCP and stent placement on 09/16/2018 showing single localized biliary stricture.  Biliary sphincterotomy was performed with stricture dilated to 6 mm with balloon to facilitate stent placement.  1 covered metal stent was placed. -Flexible sigmoidoscopy on 09/24/2018 showing narrowing  of the colorectal anastomosis.  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 - 2 cycles of FOLFIRI from 10/04/2018 through 10/25/2018 with 20% dose reduction during cycle 2. - He did experience nausea and dry heaves after cycle 2.  Appetite has severely decreased.  Colostomy bag is being drained couple of times a day.  He also reports dizziness.  Also complains of anxiety. - His blood pressure today is 96/65.  He will receive IV hydration. - I will discontinue lisinopril. -We will order UGT1A1 level today.  We will see him back next week.  2.  Weight loss: - He has tried mirtazapine.  He is currently on Marinol 5 mg twice daily which is not helping. -We will start him on Megace 10 mL twice daily.  We discussed about side effects in detail.  3.  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  He was diagnosed in 2016, status post IVC filter.  4.  Anxiety: -he is currently on Klonopin 0.5 mg as needed. -I have told him to increase it to 2 tablets twice daily. -If it is helping, we will send him new prescription.   Total time spent is 25 minutes with more than 50% of the time spent face-to-face discussing further work-up, side effect management and coordination of care.    Orders placed this encounter:  No orders of the defined types were placed in this encounter.     Derek Jack, MD  [place] [phone]

## 2018-11-02 NOTE — Progress Notes (Signed)
error 

## 2018-11-02 NOTE — Progress Notes (Signed)
Pt seen by Dr. [name] today. Labs reviewed. IV hydration only today. VSS.   IV hydration given today per MD orders. Tolerated infusion without adverse affects. Vital signs stable. No complaints at this time. Discharged from clinic wheel chair. F/U with [facility] as scheduled.

## 2018-11-04 ENCOUNTER — Inpatient Hospital Stay (HOSPITAL_COMMUNITY): Payer: Medicare Other | Attending: Hematology

## 2018-11-04 ENCOUNTER — Other Ambulatory Visit: Payer: Self-pay

## 2018-11-04 ENCOUNTER — Encounter (HOSPITAL_COMMUNITY): Payer: Self-pay

## 2018-11-04 VITALS — BP 117/67 | HR 88 | Temp 97.5°F | Resp 18

## 2018-11-04 DIAGNOSIS — K9189 Other postprocedural complications and disorders of digestive system: Secondary | ICD-10-CM | POA: Diagnosis not present

## 2018-11-04 DIAGNOSIS — Z933 Colostomy status: Secondary | ICD-10-CM | POA: Diagnosis not present

## 2018-11-04 DIAGNOSIS — Z5111 Encounter for antineoplastic chemotherapy: Secondary | ICD-10-CM | POA: Insufficient documentation

## 2018-11-04 DIAGNOSIS — G620 Drug-induced polyneuropathy: Secondary | ICD-10-CM | POA: Diagnosis not present

## 2018-11-04 DIAGNOSIS — C250 Malignant neoplasm of head of pancreas: Secondary | ICD-10-CM | POA: Diagnosis not present

## 2018-11-04 DIAGNOSIS — Z7984 Long term (current) use of oral hypoglycemic drugs: Secondary | ICD-10-CM | POA: Insufficient documentation

## 2018-11-04 DIAGNOSIS — E1151 Type 2 diabetes mellitus with diabetic peripheral angiopathy without gangrene: Secondary | ICD-10-CM | POA: Diagnosis not present

## 2018-11-04 DIAGNOSIS — Z7901 Long term (current) use of anticoagulants: Secondary | ICD-10-CM | POA: Diagnosis not present

## 2018-11-04 DIAGNOSIS — C7889 Secondary malignant neoplasm of other digestive organs: Secondary | ICD-10-CM | POA: Insufficient documentation

## 2018-11-04 DIAGNOSIS — Z86718 Personal history of other venous thrombosis and embolism: Secondary | ICD-10-CM | POA: Insufficient documentation

## 2018-11-04 DIAGNOSIS — E119 Type 2 diabetes mellitus without complications: Secondary | ICD-10-CM | POA: Diagnosis not present

## 2018-11-04 DIAGNOSIS — I10 Essential (primary) hypertension: Secondary | ICD-10-CM | POA: Diagnosis not present

## 2018-11-04 DIAGNOSIS — Z86711 Personal history of pulmonary embolism: Secondary | ICD-10-CM | POA: Diagnosis not present

## 2018-11-04 DIAGNOSIS — G893 Neoplasm related pain (acute) (chronic): Secondary | ICD-10-CM | POA: Insufficient documentation

## 2018-11-04 DIAGNOSIS — Z433 Encounter for attention to colostomy: Secondary | ICD-10-CM | POA: Diagnosis not present

## 2018-11-04 DIAGNOSIS — R112 Nausea with vomiting, unspecified: Secondary | ICD-10-CM

## 2018-11-04 DIAGNOSIS — F419 Anxiety disorder, unspecified: Secondary | ICD-10-CM | POA: Insufficient documentation

## 2018-11-04 DIAGNOSIS — Z79899 Other long term (current) drug therapy: Secondary | ICD-10-CM | POA: Diagnosis not present

## 2018-11-04 DIAGNOSIS — E860 Dehydration: Secondary | ICD-10-CM

## 2018-11-04 DIAGNOSIS — R634 Abnormal weight loss: Secondary | ICD-10-CM | POA: Diagnosis not present

## 2018-11-04 DIAGNOSIS — C20 Malignant neoplasm of rectum: Secondary | ICD-10-CM | POA: Diagnosis not present

## 2018-11-04 DIAGNOSIS — R197 Diarrhea, unspecified: Secondary | ICD-10-CM

## 2018-11-04 DIAGNOSIS — C7801 Secondary malignant neoplasm of right lung: Secondary | ICD-10-CM | POA: Diagnosis not present

## 2018-11-04 DIAGNOSIS — J029 Acute pharyngitis, unspecified: Secondary | ICD-10-CM | POA: Diagnosis not present

## 2018-11-04 DIAGNOSIS — T451X5S Adverse effect of antineoplastic and immunosuppressive drugs, sequela: Secondary | ICD-10-CM | POA: Diagnosis not present

## 2018-11-04 MED ORDER — SODIUM CHLORIDE 0.9 % IV SOLN
Freq: Once | INTRAVENOUS | Status: AC
  Administered 2018-11-04: 14:00:00 via INTRAVENOUS
  Filled 2018-11-04: qty 1000

## 2018-11-04 MED ORDER — HEPARIN SOD (PORK) LOCK FLUSH 100 UNIT/ML IV SOLN: 250.0000 [IU] | Freq: Once | INTRAVENOUS | Status: DC | PRN

## 2018-11-04 MED ORDER — ALTEPLASE 2 MG IJ SOLR: 2.0000 mg | Freq: Once | INTRAMUSCULAR | Status: DC | PRN

## 2018-11-04 MED ORDER — SODIUM CHLORIDE 0.9% FLUSH
10.0000 mL | Freq: Once | INTRAVENOUS | Status: AC | PRN
  Administered 2018-11-04: 10 mL

## 2018-11-04 MED ORDER — HEPARIN SOD (PORK) LOCK FLUSH 100 UNIT/ML IV SOLN
500.0000 [IU] | Freq: Once | INTRAVENOUS | Status: AC | PRN
  Administered 2018-11-04: 500 [IU]

## 2018-11-04 MED ORDER — SODIUM CHLORIDE 0.9% FLUSH: 3.0000 mL | Freq: Once | INTRAVENOUS | Status: DC | PRN

## 2018-11-04 NOTE — Patient Instructions (Signed)
[place] at  [facility] Discharge Instructions  Received IV hydration with magnesium and potassium today. Follow-up as scheduled. Call clinic for any questions or concerns   Thank you for choosing [place] at [facility] to provide your oncology and hematology care.  To afford each patient quality time with our provider, please arrive at least 15 minutes before your scheduled appointment time.   If you have a lab appointment with the [facility] please come in thru the  Main Entrance and check in at the main information desk  You need to re-schedule your appointment should you arrive 10 or more minutes late.  We strive to give you quality time with our providers, and arriving late affects you and other patients whose appointments are after yours.  Also, if you no show three or more times for appointments you may be dismissed from the clinic at the providers discretion.     Again, thank you for choosing [facility].  Our hope is that these requests will decrease the amount of time that you wait before being seen by our physicians.       _____________________________________________________________  Should you have questions after your visit to [facility], please contact our office at [phone] between the hours of 8:00 a.m. and 4:30 p.m.  Voicemails left after 4:00 p.m. will not be returned until the following business day.  For prescription refill requests, have your pharmacy contact our office and allow 72 hours.    Cancer Center Support Programs:   > Cancer Support Group  2nd Tuesday of the month 1pm-2pm, Journey Room

## 2018-11-04 NOTE — Progress Notes (Signed)
[name] tolerated IV hydration with magnesium and potassium well without complaints or incident. VSS upon discharge. Pt discharged via wheelchair in satisfactory condition

## 2018-11-08 ENCOUNTER — Other Ambulatory Visit: Payer: Self-pay

## 2018-11-08 ENCOUNTER — Inpatient Hospital Stay (HOSPITAL_COMMUNITY): Payer: Medicare Other

## 2018-11-08 ENCOUNTER — Encounter (HOSPITAL_COMMUNITY): Payer: Self-pay

## 2018-11-08 VITALS — BP 126/61 | HR 80 | Temp 97.6°F | Resp 18

## 2018-11-08 DIAGNOSIS — R197 Diarrhea, unspecified: Secondary | ICD-10-CM

## 2018-11-08 DIAGNOSIS — T451X5S Adverse effect of antineoplastic and immunosuppressive drugs, sequela: Secondary | ICD-10-CM | POA: Diagnosis not present

## 2018-11-08 DIAGNOSIS — K9189 Other postprocedural complications and disorders of digestive system: Secondary | ICD-10-CM | POA: Diagnosis not present

## 2018-11-08 DIAGNOSIS — G893 Neoplasm related pain (acute) (chronic): Secondary | ICD-10-CM | POA: Diagnosis not present

## 2018-11-08 DIAGNOSIS — R112 Nausea with vomiting, unspecified: Secondary | ICD-10-CM

## 2018-11-08 DIAGNOSIS — Z433 Encounter for attention to colostomy: Secondary | ICD-10-CM | POA: Diagnosis not present

## 2018-11-08 DIAGNOSIS — C20 Malignant neoplasm of rectum: Secondary | ICD-10-CM | POA: Diagnosis not present

## 2018-11-08 DIAGNOSIS — E860 Dehydration: Secondary | ICD-10-CM

## 2018-11-08 DIAGNOSIS — C250 Malignant neoplasm of head of pancreas: Secondary | ICD-10-CM | POA: Diagnosis not present

## 2018-11-08 DIAGNOSIS — C7801 Secondary malignant neoplasm of right lung: Secondary | ICD-10-CM | POA: Diagnosis not present

## 2018-11-08 DIAGNOSIS — G620 Drug-induced polyneuropathy: Secondary | ICD-10-CM | POA: Diagnosis not present

## 2018-11-08 DIAGNOSIS — Z5111 Encounter for antineoplastic chemotherapy: Secondary | ICD-10-CM | POA: Diagnosis not present

## 2018-11-08 DIAGNOSIS — R634 Abnormal weight loss: Secondary | ICD-10-CM | POA: Diagnosis not present

## 2018-11-08 DIAGNOSIS — C7889 Secondary malignant neoplasm of other digestive organs: Secondary | ICD-10-CM | POA: Diagnosis not present

## 2018-11-08 DIAGNOSIS — E1151 Type 2 diabetes mellitus with diabetic peripheral angiopathy without gangrene: Secondary | ICD-10-CM | POA: Diagnosis not present

## 2018-11-08 LAB — COMPREHENSIVE METABOLIC PANEL
ALT: 14 U/L (ref 0–44)
AST: 15 U/L (ref 15–41)
Albumin: 3.3 g/dL — ABNORMAL LOW (ref 3.5–5.0)
Alkaline Phosphatase: 104 U/L (ref 38–126)
Anion gap: 7 (ref 5–15)
BUN: 13 mg/dL (ref 8–23)
CO2: 25 mmol/L (ref 22–32)
Calcium: 8.9 mg/dL (ref 8.9–10.3)
Chloride: 107 mmol/L (ref 98–111)
Creatinine, Ser: 0.61 mg/dL (ref 0.61–1.24)
GFR calc Af Amer: >60 mL/min (ref >60)
GFR calc non Af Amer: >60 mL/min (ref >60)
Glucose, Bld: 132 mg/dL — ABNORMAL HIGH (ref 70–99)
Potassium: 3.5 mmol/L (ref 3.5–5.1)
Sodium: 139 mmol/L (ref 135–145)
Total Bilirubin: 0.9 mg/dL (ref 0.3–1.2)
Total Protein: 6.2 g/dL — ABNORMAL LOW (ref 6.5–8.1)

## 2018-11-08 LAB — CBC WITH DIFFERENTIAL/PLATELET
Abs Immature Granulocytes: 0.09 10*3/uL — ABNORMAL HIGH (ref 0.00–0.07)
Basophils Absolute: 0 10*3/uL (ref 0.0–0.1)
Basophils Relative: 0 %
Eosinophils Absolute: 0.3 10*3/uL (ref 0.0–0.5)
Eosinophils Relative: 3 %
HCT: 36.5 % — ABNORMAL LOW (ref 39.0–52.0)
Hemoglobin: 12.1 g/dL — ABNORMAL LOW (ref 13.0–17.0)
Immature Granulocytes: 1 %
Lymphocytes Relative: 21 %
Lymphs Abs: 2.2 10*3/uL (ref 0.7–4.0)
MCH: 31.1 pg (ref 26.0–34.0)
MCHC: 33.2 g/dL (ref 30.0–36.0)
MCV: 93.8 fL (ref 80.0–100.0)
Monocytes Absolute: 0.6 10*3/uL (ref 0.1–1.0)
Monocytes Relative: 6 %
Neutro Abs: 7.4 10*3/uL (ref 1.7–7.7)
Neutrophils Relative %: 69 %
Platelets: 219 10*3/uL (ref 150–400)
RBC: 3.89 MIL/uL — ABNORMAL LOW (ref 4.22–5.81)
RDW: 15 % (ref 11.5–15.5)
WBC: 10.7 10*3/uL — ABNORMAL HIGH (ref 4.0–10.5)
nRBC: 0 % (ref 0.0–0.2)

## 2018-11-08 LAB — MAGNESIUM: Magnesium: 1.7 mg/dL (ref 1.7–2.4)

## 2018-11-08 MED ORDER — HEPARIN SOD (PORK) LOCK FLUSH 100 UNIT/ML IV SOLN
500.0000 [IU] | Freq: Once | INTRAVENOUS | Status: AC | PRN
  Administered 2018-11-08: 500 [IU]

## 2018-11-08 MED ORDER — SODIUM CHLORIDE 0.9% FLUSH
10.0000 mL | Freq: Once | INTRAVENOUS | Status: AC | PRN
  Administered 2018-11-08: 10 mL

## 2018-11-08 MED ORDER — SODIUM CHLORIDE 0.9 % IV SOLN
Freq: Once | INTRAVENOUS | Status: AC
  Administered 2018-11-08: 13:00:00 via INTRAVENOUS
  Filled 2018-11-08: qty 1000

## 2018-11-08 NOTE — Patient Instructions (Signed)
[place] at [facility] Discharge Instructions  Received IV hydration with potassium and magnesium today. Follow-up as scheduled. Call clinic for any questions or concerns   Thank you for choosing [place] at [facility] to provide your oncology and hematology care.  To afford each patient quality time with our provider, please arrive at least 15 minutes before your scheduled appointment time.   If you have a lab appointment with the [facility] please come in thru the  Main Entrance and check in at the main information desk  You need to re-schedule your appointment should you arrive 10 or more minutes late.  We strive to give you quality time with our providers, and arriving late affects you and other patients whose appointments are after yours.  Also, if you no show three or more times for appointments you may be dismissed from the clinic at the providers discretion.     Again, thank you for choosing [facility].  Our hope is that these requests will decrease the amount of time that you wait before being seen by our physicians.       _____________________________________________________________  Should you have questions after your visit to [facility], please contact our office at [phone] between the hours of 8:00 a.m. and 4:30 p.m.  Voicemails left after 4:00 p.m. will not be returned until the following business day.  For prescription refill requests, have your pharmacy contact our office and allow 72 hours.    Cancer Center Support Programs:   > Cancer Support Group  2nd Tuesday of the month 1pm-2pm, Journey Room

## 2018-11-08 NOTE — Progress Notes (Signed)
Charles [name] tolerated IV hydration with magnesium and potassium well without complaints or incident. VSS upon discharge. Pt discharged via wheelchair in satisfactory condition

## 2018-11-09 ENCOUNTER — Encounter (HOSPITAL_COMMUNITY): Payer: Self-pay | Admitting: Hematology

## 2018-11-09 ENCOUNTER — Other Ambulatory Visit (HOSPITAL_COMMUNITY): Payer: Medicare Other

## 2018-11-09 ENCOUNTER — Inpatient Hospital Stay (HOSPITAL_BASED_OUTPATIENT_CLINIC_OR_DEPARTMENT_OTHER): Payer: Medicare Other | Admitting: Hematology

## 2018-11-09 ENCOUNTER — Encounter (HOSPITAL_COMMUNITY): Payer: Self-pay

## 2018-11-09 ENCOUNTER — Inpatient Hospital Stay (HOSPITAL_COMMUNITY): Payer: Medicare Other

## 2018-11-09 VITALS — BP 123/63 | HR 70 | Temp 96.9°F | Resp 16

## 2018-11-09 DIAGNOSIS — C7801 Secondary malignant neoplasm of right lung: Secondary | ICD-10-CM

## 2018-11-09 DIAGNOSIS — C20 Malignant neoplasm of rectum: Secondary | ICD-10-CM

## 2018-11-09 DIAGNOSIS — G893 Neoplasm related pain (acute) (chronic): Secondary | ICD-10-CM | POA: Diagnosis not present

## 2018-11-09 DIAGNOSIS — F419 Anxiety disorder, unspecified: Secondary | ICD-10-CM | POA: Diagnosis not present

## 2018-11-09 DIAGNOSIS — Z5111 Encounter for antineoplastic chemotherapy: Secondary | ICD-10-CM | POA: Diagnosis not present

## 2018-11-09 DIAGNOSIS — Z7901 Long term (current) use of anticoagulants: Secondary | ICD-10-CM | POA: Diagnosis not present

## 2018-11-09 DIAGNOSIS — R634 Abnormal weight loss: Secondary | ICD-10-CM

## 2018-11-09 DIAGNOSIS — Z86718 Personal history of other venous thrombosis and embolism: Secondary | ICD-10-CM

## 2018-11-09 DIAGNOSIS — Z933 Colostomy status: Secondary | ICD-10-CM | POA: Diagnosis not present

## 2018-11-09 DIAGNOSIS — Z79899 Other long term (current) drug therapy: Secondary | ICD-10-CM | POA: Diagnosis not present

## 2018-11-09 DIAGNOSIS — C7889 Secondary malignant neoplasm of other digestive organs: Secondary | ICD-10-CM

## 2018-11-09 DIAGNOSIS — R112 Nausea with vomiting, unspecified: Secondary | ICD-10-CM

## 2018-11-09 DIAGNOSIS — Z86711 Personal history of pulmonary embolism: Secondary | ICD-10-CM | POA: Diagnosis not present

## 2018-11-09 DIAGNOSIS — E860 Dehydration: Secondary | ICD-10-CM

## 2018-11-09 MED ORDER — HEPARIN SOD (PORK) LOCK FLUSH 100 UNIT/ML IV SOLN
500.0000 [IU] | Freq: Once | INTRAVENOUS | Status: AC | PRN
  Administered 2018-11-09: 500 [IU]

## 2018-11-09 MED ORDER — SODIUM CHLORIDE 0.9 % IV SOLN
Freq: Once | INTRAVENOUS | Status: AC
  Administered 2018-11-09: 09:00:00 via INTRAVENOUS
  Filled 2018-11-09: qty 1000

## 2018-11-09 MED ORDER — ONDANSETRON 8 MG/NS 50 ML IVPB: 8.0000 mg | Freq: Once | INTRAVENOUS | Status: DC

## 2018-11-09 MED ORDER — SODIUM CHLORIDE 0.9 % IV SOLN
8.0000 mg | Freq: Once | INTRAVENOUS | Status: AC
  Administered 2018-11-09: 8 mg via INTRAVENOUS
  Filled 2018-11-09: qty 4

## 2018-11-09 MED ORDER — SODIUM CHLORIDE 0.9% FLUSH
10.0000 mL | Freq: Once | INTRAVENOUS | Status: AC | PRN
  Administered 2018-11-09: 10 mL

## 2018-11-09 MED ORDER — CLONAZEPAM 1 MG PO TABS: 1.0000 mg | ORAL_TABLET | Freq: Three times a day (TID) | ORAL | 0 refills | Status: DC | PRN

## 2018-11-09 NOTE — Progress Notes (Signed)
[place], [place] [address]   CLINIC:  Medical Oncology/Hematology  PCP:  [name], MD [place] [address] [phone]   REASON FOR VISIT:  Follow-up for Stage IV rectal cancer  CURRENT THERAPY: FOLFIRI  BRIEF ONCOLOGIC HISTORY:  Oncology History  Rectal cancer ([place])  07/24/2010 Initial Diagnosis   Invasive adenocarcinoma of rectum   09/09/2010 Concurrent Chemotherapy   S/P radiation and concurrent 5-FU continuous infusion from 09/09/10- 10/10/10.   11/14/2010 Surgery   S/P proctectomy with colorectal anastomosis and diverting loop ileostomy on 11/14/10 at [facility] by Dr. Harlon Ditty. Pathology reveals a pT3b N1 with 3/20 lymph nodes.   02/05/2011 - 07/14/2011 Chemotherapy   FOLFOX   08/18/2011 Surgery   Approximate date of surgery- [place] by Dr. Harlon Ditty    Remission     11/24/2013 Survivorship   Colonoscopy- somewhat fibrotic/friable anastomotic mucosal-status post biopsy (narrowing not felt to be clinically significant).  Negative pathology for malignancy   09/12/2014 Imaging   DVT in the left femoral venous system, left common iliac vein, IVC, and within the IVC filter Right lower extremity venography confirms chronic occlusion of the femoral venous system with collateralization. The right iliac venous system is patent and do   09/25/2014 PET scan   The right middle lobe pulmonary nodule is hypermetabolic, favored to represent a primary bronchogenic carcinoma.Equivocal mediastinal nodes, similar to surrounding blood pool. Bilateral adrenal hypermetabolism, felt to be physiologic   10/24/2014 Pathology Results   Lung, needle/core biopsy(ies), RML - ADENOCARCINOMA, SEE COMMENT metastatic adenocarcinoma of a colorectal primary   10/24/2014 Relapse/Recurrence     11/16/2014 Definitive Surgery   Bronchoscopy, right video-assisted thoracoscopy, wedge resection of right middle lobe by Dr. Servando Snare   11/16/2014  Pathology Results   Lung, wedge biopsy/resection, right lingula and small portion of middle lobe - METASTATIC ADENOCARCINOMA, CONSISTENT WITH COLORECTAL PRIMARY, SPANNING 2.0 CM. - THE SURGICAL RESECTION MARGINS ARE NEGATIVE FOR ADENOCARCINOMA.   11/16/2014 Remission   THE SURGICAL RESECTION MARGINS ARE NEGATIVE FOR ADENOCARCINOMA.   03/07/2015 Imaging   CT CAP- Interval resection of right middle lobe metastasis. No acute process or evidence of metastatic disease in the chest, abdomen or pelvis. Improved right upper lobe reticular nodular opacities are favored to represent resolving infection.   09/14/2015 Imaging   CT CAP- No findings of recurrent malignancy. No recurrence along the wedge resection site of the right middle lobe. Right anterior abdominal wall focal hernia containing a knuckle of small bowel without complicating feature.   06/13/2016 Imaging   CT CAP- 1. New right-sided pleural metastasis/mass. Other smaller right-sided pulmonary nodules which are all pleural-based and most likely represent pleural metastasis. 2. No convincing evidence of abdominopelvic nodal metastasis. 3. Constellation of findings, including pancreatic atrophy, duct dilatation, and pancreatic head soft tissue fullness which are highly suspicious for pancreatic adenocarcinoma. Metastatic disease felt much less likely. Consider endoscopic ultrasound sampling or ERCP. Cannot exclude superimposed acute pancreatitis. 4. New enlargement of the appendiceal tip with subtle surrounding edema. Cannot exclude early or mild appendicitis. 5. Coronary artery atherosclerosis. Aortic atherosclerosis. 6. 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   06/13/2016 Progression   CT scan demonstrates progression of disease   06/19/2016 Procedure   EUS with FNA by Dr. Ardis Hughs   06/20/2016 Pathology Results   FINE NEEDLE ASPIRATION, ENDOSCOPIC, PANCREAS UNCINATE AREA(SPECIMEN 1 OF 1 COLLECTED 06/19/16):  MALIGNANT CELLS CONSISTENT WITH METASTATIC ADENOCARCINOMA.  06/25/2016 PET scan   1. Two pleural-based nodules in the right hemithorax are hypermetabolic. Metastatic disease is a distinct consideration. The scattered pulmonary parenchymal nodule seen on previous diagnostic CT imaging are below the threshold for reliable resolution on PET imaging. 2. Hypermetabolic lesion pancreatic head, consistent with neoplasm. As noted on prior CT, adenocarcinoma is any consideration. No evidence for hypermetabolic abdominal lymphadenopathy. Mottled uptake noted in the liver, but no discrete hepatic metastases are evident on PET imaging. 3. Appendix remains distended up to 0.9-10 mm diameter with a stone towards the tip in subtle periappendiceal edema/inflammation. The appendix is hypermetabolic along its length. Imaging features are relatively stable in the 12 day interval since prior CT scan. While appendicitis is a consideration, the relative stability over 12 days would be unusual for that etiology. Continued close follow-up recommended.   06/26/2016 Pathology Results   Not enough tissue for foundationONE or K-ras testing.   07/07/2016 Procedure   Port placed by Dr. Arnoldo Morale   07/08/2016 -  Chemotherapy   The patient had pegfilgrastim (NEULASTA) injection 6 mg, 6 mg, Subcutaneous,  Once, 0 of 4 cycles  ondansetron (ZOFRAN) IVPB 8 mg, 8 mg, Intravenous,  Once, 0 of 4 cycles  leucovorin 804 mg in dextrose 5 % 250 mL infusion, 400 mg/m2, Intravenous,  Once, 0 of 4 cycles  oxaliplatin (ELOXATIN) 170 mg in dextrose 5 % 500 mL chemo infusion, 85 mg/m2, Intravenous,  Once, 0 of 4 cycles  fluorouracil (ADRUCIL) 4,800 mg in sodium chloride 0.9 % 150 mL chemo infusion, 2,400 mg/m2 = 4,800 mg, Intravenous, 1 Day/Dose, 0 of 4 cycles  fluorouracil (ADRUCIL) chemo injection 800 mg, 400 mg/m2, Intravenous,  Once, 0 of 4 cycles  pegfilgrastim (NEULASTA) injection 6 mg, 6 mg, Subcutaneous,  Once, 2 of 2  cycles  ondansetron (ZOFRAN) IVPB 8 mg, 8 mg, Intravenous,  Once, 12 of 12 cycles  leucovorin 660 mg in dextrose 5 % 250 mL infusion, 660 mg (original dose ), Intravenous,  Once, 12 of 12 cycles Dose modification: 660 mg (Cycle 1)  oxaliplatin (ELOXATIN) 140 mg in dextrose 5 % 500 mL chemo infusion, 140 mg (original dose ), Intravenous,  Once, 12 of 12 cycles Dose modification: 140 mg (Cycle 1), 119 mg (85 % of original dose 140 mg, Cycle 8, Reason: Provider Judgment)  fluorouracil (ADRUCIL) 3,850 mg in sodium chloride 0.9 % 150 mL chemo infusion, 3,850 mg (original dose ), Intravenous, 1 Day/Dose, 12 of 12 cycles Dose modification: 3,850 mg (Cycle 1)  fluorouracil (ADRUCIL) chemo injection 700 mg, 700 mg (original dose ), Intravenous,  Once, 12 of 12 cycles Dose modification: 700 mg (Cycle 1)  palonosetron (ALOXI) injection 0.25 mg, 0.25 mg, Intravenous,  Once, 7 of 12 cycles Administration: 0.25 mg (09/30/2016)  bevacizumab (AVASTIN) 475 mg in sodium chloride 0.9 % 100 mL chemo infusion, 5 mg/kg = 475 mg, Intravenous,  Once, 6 of 11 cycles Administration: 500 mg (09/30/2016)  leucovorin 880 mg in dextrose 5 % 250 mL infusion, 400 mg/m2 = 880 mg, Intravenous,  Once, 7 of 12 cycles Administration: 880 mg (09/30/2016)  oxaliplatin (ELOXATIN) 185 mg in dextrose 5 % 500 mL chemo infusion, 85 mg/m2 = 185 mg, Intravenous,  Once, 7 of 12 cycles Administration: 185 mg (09/30/2016)  fluorouracil (ADRUCIL) chemo injection 900 mg, 400 mg/m2 = 900 mg, Intravenous,  Once, 7 of 12 cycles Administration: 900 mg (09/30/2016)  fluorouracil (ADRUCIL) 5,300 mg in sodium chloride 0.9 % 144 mL chemo infusion, 2,400 mg/m2 = 5,300  mg, Intravenous, 1 Day/Dose, 7 of 12 cycles Administration: 5,300 mg (09/30/2016)  for chemotherapy treatment.     08/27/2016 Imaging   CT CAP- 1. Pulmonary metastatic disease is stable. 2. Pancreatic head mass, grossly stable, with progressive atrophy of the body and tail of the  pancreas. Stable peripancreatic lymph node. 3. Mild circumferential rectal wall thickening, stable. 4. Aortic atherosclerosis (ICD10-170.0). Coronary artery calcification. 5. Hyperattenuating lesion off the lower pole left kidney, stable, too small to characterize. Continued attention on followup exams is warranted. 6. Persistent wall thickening and mild dilatation of the proximal appendix, of uncertain etiology. Continued attention on followup exams is warranted.   11/06/2016 Imaging   CT C/A/P: IMPRESSION: 1. Stable pulmonary metastatic disease. 2. Similar appearing pancreatic head mass. Progressed atrophy of the pancreatic body and tail. Stable peripancreatic lymph node. 3. Stable hypoattenuating lesion partially exophytic off the inferior pole of the left kidney. 4. Aortic atherosclerosis. 5. Persistent mild wall thickening and mild dilatation of the appendix.    01/30/2017 Imaging   Ct C/A/P: IMPRESSION: 1. Stable pulmonary metastatic disease. 2. Stable to slight increase in size of pancreatic head mass. 3.  Aortic Atherosclerosis (ICD10-I70.0). 4. Similar appearance of mild wall thickening of the appendix which contains an appendicolith.   05/01/2017 Imaging   CT C/A/P: Stable disease in the lungs, no progression of disease in the abdomen.   10/04/2018 -  Chemotherapy   The patient had palonosetron (ALOXI) injection 0.25 mg, 0.25 mg, Intravenous,  Once, 2 of 4 cycles Administration: 0.25 mg (10/04/2018), 0.25 mg (10/25/2018) pegfilgrastim (NEULASTA) injection 6 mg, 6 mg, Subcutaneous, Once, 1 of 3 cycles Administration: 6 mg (10/27/2018) bevacizumab (AVASTIN) 450 mg in sodium chloride 0.9 % 100 mL chemo infusion, 5 mg/kg = 450 mg, Intravenous,  Once, 1 of 3 cycles irinotecan (CAMPTOSAR) 380 mg in sodium chloride 0.9 % 500 mL chemo infusion, 180 mg/m2 = 380 mg, Intravenous,  Once, 2 of 4 cycles Dose modification: 144 mg/m2 (original dose 180 mg/m2, Cycle 2, Reason: Provider  Judgment, Comment: 20% dose reduction) Administration: 380 mg (10/04/2018), 300 mg (10/25/2018) leucovorin 900 mg in sodium chloride 0.9 % 250 mL infusion, 421 mg/m2 = 856 mg, Intravenous,  Once, 2 of 4 cycles Dose modification: 320 mg/m2 (original dose 400 mg/m2, Cycle 2, Reason: Provider Judgment, Comment: 20% dose reduction) Administration: 900 mg (10/04/2018), 700 mg (10/25/2018) fluorouracil (ADRUCIL) chemo injection 850 mg, 400 mg/m2 = 850 mg, Intravenous,  Once, 2 of 4 cycles Dose modification: 320 mg/m2 (original dose 400 mg/m2, Cycle 2, Reason: Provider Judgment, Comment: 20% dose reduction) Administration: 850 mg (10/04/2018), 700 mg (10/25/2018) fosaprepitant (EMEND) 150 mg, dexamethasone (DECADRON) 12 mg in sodium chloride 0.9 % 145 mL IVPB, , Intravenous,  Once, 0 of 2 cycles fluorouracil (ADRUCIL) 5,000 mg in sodium chloride 0.9 % 150 mL chemo infusion, 2,325 mg/m2 = 5,150 mg, Intravenous, 1 Day/Dose, 2 of 4 cycles Dose modification: 1,920 mg/m2 (original dose 2,400 mg/m2, Cycle 2, Reason: Provider Judgment, Comment: 20% dose reduction) Administration: 5,000 mg (10/04/2018), 4,100 mg (10/25/2018)  for chemotherapy treatment.       CANCER STAGING: Cancer Staging Rectal cancer [facility]) Staging form: Colon and Rectum, AJCC 7th Edition - Clinical: Stage IIIB (T3, N1, M0) - Signed by [name], MD on 02/04/2011 - Pathologic stage from 11/17/2014: Stage IVA (M1a) - Signed by Baird Cancer, PA-C on 09/14/2015    INTERVAL HISTORY:  Mr. Perrell 68 y.o. male seen with his wife for metastatic rectal cancer.  Last  cycle of chemotherapy was on 10/25/2018.  He became very weak after his last cycle.  He is also more anxious.  He started taking Megace on Sunday.  He has not noticed any improvement in appetite.  He is eating 2 meals a day, half portions.  He is emptying his bag about 3 times a day.  Denies any bleeding issues.  Appetite and energy levels are severely low.   REVIEW OF SYSTEMS:   Review of Systems  Constitutional: Positive for appetite change and fatigue.  HENT:  Negative.   Eyes: Negative.   Respiratory: Negative.   Cardiovascular: Negative.   Gastrointestinal: Positive for nausea.  Genitourinary: Negative.    Musculoskeletal: Negative.   Skin: Negative.   Neurological: Positive for dizziness.  Hematological: Negative.   Psychiatric/Behavioral: The patient is nervous/anxious.   All other systems reviewed and are negative.    PAST MEDICAL/SURGICAL HISTORY:  Past Medical History:  Diagnosis Date  . Chronic anticoagulation   . Colon cancer ([place]) 07/24/2010   rectal ca, inv adenocarcinoma  . Depression 04/01/2011  . Diabetes mellitus without complication ([place])   . DVT (deep venous thrombosis) ([place]) 05/09/2011  . GERD (gastroesophageal reflux disease)   . High output ileostomy ([place]) 05/21/2011  . History of kidney stones   . HTN (hypertension)   . Hx of radiation therapy 09/02/10 to 10/14/10   pelvis  . Lung metastasis ([place])   . Neuropathy   . Peripheral vascular disease ([place])    dvt's,pe  . Pneumonia    hx x3  . Pulmonary embolism ([place])   . Rectal cancer ([place]) 01/15/2011   S/P radiation and concurrent 5-FU continuous infusion from 09/09/10- 10/10/10.  S/P proctectomy with colorectal anastomosis and diverting loop ileostomy on 11/14/10 at [facility] by Dr. Harlon Ditty. Pathology reveals a pT3b N1 with 3/20 lymph nodes.     Past Surgical History:  Procedure Laterality Date  . BILIARY STENT PLACEMENT N/A 09/16/2018   Procedure: STENT PLACEMENT;  Surgeon: Rogene Houston, MD;  [facility];  Service: Endoscopy;  Laterality: N/A;  . BIOPSY  09/24/2018   Procedure: BIOPSY;  Surgeon: Danie Binder, MD;  [facility];  Service: Endoscopy;;  . COLON SURGERY  11/14/2010   proctectomy with colorectal anastomosis and diverting loop ileostomy (temporary planned)  . COLONOSCOPY  07/2010   proximal rectal apple core mass 10-14cm from anal verge  (adenocarcinoma), 2-3cm distal rectal carpet polyp s/p piecemeal snare polypectomy (adenoma)  . COLONOSCOPY  04/21/2012   RMR: Friable,fibrotic appearing colorectal anastomosis producing some luminal narrowing-not felt to be critical. path: focal erosion with slight inflammation and hyperemia. SURVEILLANCE DUE DEC 2015  . COLONOSCOPY N/A 11/24/2013   Dr. Rourk:somewhat fibrotic/friable anastomotic mucosal-status post biopsy (narrowing not felt to be clinically significant) Single colonic diverticulum. benign polypoid rectal mucosa  . colostomy reversal  april 2013  . ERCP N/A 09/16/2018   Procedure: ENDOSCOPIC RETROGRADE CHOLANGIOPANCREATOGRAPHY (ERCP);  Surgeon: Rogene Houston, MD;  [facility];  Service: Endoscopy;  Laterality: N/A;  . ESOPHAGOGASTRODUODENOSCOPY  07/2010   RMR: schatki ring s/p dilation, small hh, SB bx benign  . ESOPHAGOGASTRODUODENOSCOPY N/A 09/04/2015   Procedure: ESOPHAGOGASTRODUODENOSCOPY (EGD);  Surgeon: Daneil Dolin, MD;  [facility];  Service: Endoscopy;  Laterality: N/A;  730  . EUS  08/2010   Dr. Owens Loffler. uT3N0 circumferential, nearly obstruction rectosigmoid adenocarcinoma, distal edge 12cm from anal verge  . EUS N/A 06/19/2016   Procedure: UPPER ENDOSCOPIC ULTRASOUND (  EUS) RADIAL;  Surgeon: Milus Banister, MD;  [facility];  Service: Endoscopy;  Laterality: N/A;  . FLEXIBLE SIGMOIDOSCOPY N/A 09/24/2018   Procedure: FLEXIBLE SIGMOIDOSCOPY;  Surgeon: Danie Binder, MD;  [facility];  Service: Endoscopy;  Laterality: N/A;  . HERNIA REPAIR     abd hernia repair  . IVC filter    . ivc filter    . port a cath placement    . PORT-A-CATH REMOVAL  09/24/2011   Procedure: REMOVAL PORT-A-CATH;  Surgeon: Donato Heinz, MD;  Location: [facility];  Service: General;  Laterality: N/A;  Minor Room  . PORTACATH PLACEMENT Right 07/07/2016   Procedure: INSERTION PORT-A-CATH;  Surgeon: Aviva Signs, MD;  Location: [facility];  Service:  General;  Laterality: Right;  . SPHINCTEROTOMY N/A 09/16/2018   Procedure: SPHINCTEROTOMY with balloon dialation;  Surgeon: Rogene Houston, MD;  [facility];  Service: Endoscopy;  Laterality: N/A;  . TRANSVERSE LOOP COLOSTOMY N/A 09/29/2018   Procedure: TRANSVERSE LOOP COLOSTOMY;  Surgeon: Aviva Signs, MD;  Location: [facility];  Service: General;  Laterality: N/A;  . VIDEO ASSISTED THORACOSCOPY (VATS)/WEDGE RESECTION Right 11/15/2014   Procedure: VIDEO ASSISTED THORACOSCOPY (VATS)/LUNG RESECTION WITH RIGHT LINGULECTOMY;  Surgeon: [name], MD;  Location: [facility];  Service: Thoracic;  Laterality: Right;  Marland Kitchen VIDEO BRONCHOSCOPY N/A 11/15/2014   Procedure: VIDEO BRONCHOSCOPY;  Surgeon: [name], MD;  Location: [facility] OR;  Service: Thoracic;  Laterality: N/A;     SOCIAL HISTORY:  Social History   Socioeconomic History  . Marital status: Married    Spouse name: Not on file  . Number of children: 2  . Years of education: Not on file  . Highest education level: Not on file  Occupational History  . Occupation: self-employed Regulatory affairs officer, currently not working    Fish farm manager: SELF EMPLOYED  Social Needs  . Financial resource strain: Not very hard  . Food insecurity    Worry: Never true    Inability: Never true  . Transportation needs    Medical: No    Non-medical: No  Tobacco Use  . Smoking status: Never Smoker  . Smokeless tobacco: Never Used  Substance and Sexual Activity  . Alcohol use: No  . Drug use: No  . Sexual activity: Yes    Birth control/protection: None    Comment: married  Lifestyle  . Physical activity    Days per week: 0 days    Minutes per session: 0 min  . Stress: Only a little  Relationships  . Social connections    Talks on phone: More than three times a week    Gets together: Once a week    Attends religious service: Patient refused    Active member of club or organization: Patient refused    Attends meetings of clubs or  organizations: Patient refused    Relationship status: Patient refused  . Intimate partner violence    Fear of current or ex partner: Patient refused    Emotionally abused: Patient refused    Physically abused: Patient refused    Forced sexual activity: Patient refused  Other Topics Concern  . Not on file  Social History Narrative  . Not on file    FAMILY HISTORY:  Family History  Problem Relation Age of Onset  . Cancer Brother        throat  . Cancer Brother        prostate  . Colon cancer Neg Hx   .  Liver disease Neg Hx   . Inflammatory bowel disease Neg Hx     CURRENT MEDICATIONS:  Outpatient Encounter Medications as of 11/09/2018  Medication Sig  . acetaminophen (TYLENOL) 500 MG tablet Take 1,000 mg by mouth every 6 (six) hours as needed for mild pain or moderate pain.   . clonazePAM (KLONOPIN) 1 MG tablet Take 1 tablet (1 mg total) by mouth 3 (three) times daily as needed.  . Diphenhyd-Hydrocort-Nystatin (FIRST-DUKES MOUTHWASH) SUSP Use as directed 5 mLs in the mouth or throat 4 (four) times daily.  . diphenoxylate-atropine (LOMOTIL) 2.5-0.025 MG tablet Take 1 tablet by mouth 4 (four) times daily as needed for diarrhea or loose stools.  Marland Kitchen dronabinol (MARINOL) 5 MG capsule Take 1 capsule (5 mg total) by mouth 2 (two) times daily before a meal.  . gabapentin (NEURONTIN) 300 MG capsule Take 3 capsules (900 mg total) by mouth 2 (two) times daily.  Marland Kitchen glimepiride (AMARYL) 2 MG tablet Take 2 mg by mouth daily with breakfast.   . megestrol (MEGACE) 400 MG/10ML suspension Take 10 mLs (400 mg total) by mouth 2 (two) times daily.  Marland Kitchen omeprazole (PRILOSEC) 20 MG capsule Take 20 mg by mouth 2 (two) times daily before a meal.   . ondansetron (ZOFRAN) 8 MG tablet Take 1 tablet (8 mg total) by mouth 2 (two) times daily as needed for refractory nausea / vomiting. Start on day 3 after chemotherapy.  Marland Kitchen oxyCODONE-acetaminophen (PERCOCET) 10-325 MG tablet Take 1 tablet by mouth every 6 (six) hours  as needed for pain.  . Probiotic Product (PROBIOTIC DAILY PO) Take 1 capsule by mouth daily.  . prochlorperazine (COMPAZINE) 10 MG tablet Take 1 tablet (10 mg total) by mouth every 6 (six) hours as needed for nausea or vomiting.  . promethazine (PHENERGAN) 25 MG tablet Take 1 tablet (25 mg total) by mouth every 8 (eight) hours as needed for refractory nausea / vomiting.  . rivaroxaban (XARELTO) 20 MG TABS tablet Take 1 tablet (20 mg total) by mouth daily with supper.  Marland Kitchen scopolamine (TRANSDERM-SCOP) 1 MG/3DAYS Place 1 patch (1.5 mg total) onto the skin every 3 (three) days.  Marland Kitchen scopolamine (TRANSDERM-SCOP) 1 MG/3DAYS   . traZODone (DESYREL) 100 MG tablet Take 1 tablet (100 mg total) by mouth at bedtime.  . vitamin B-12 (CYANOCOBALAMIN) 1000 MCG tablet Take 1,000 mcg by mouth daily.  . [DISCONTINUED] clonazePAM (KLONOPIN) 0.5 MG tablet Take 1 tablet (0.5 mg total) by mouth 2 (two) times daily as needed.  . [DISCONTINUED] mirtazapine (REMERON) 7.5 MG tablet Take 1 tablet (7.5 mg total) by mouth at bedtime.  . [DISCONTINUED] prochlorperazine (COMPAZINE) 10 MG tablet    Facility-Administered Encounter Medications as of 11/09/2018  Medication  . [COMPLETED] heparin lock flush 100 unit/mL  . sodium chloride 0.9 % injection 10 mL  . sodium chloride flush (NS) 0.9 % injection 10 mL    ALLERGIES:  Allergies  Allergen Reactions  . Oxycodone     Blisters, hallucinations  . Tramadol     Blisters, hallucinations  . Trazodone And Nefazodone Other (See Comments)    hallucinations     PHYSICAL EXAM:  ECOG Performance status: 2  Vitals:   11/09/18 0800  BP: 129/78  Pulse: 98  Resp: 16  Temp: (!) 97.5 F (36.4 C)  SpO2: 100%   Filed Weights   11/09/18 0800  Weight: 171 lb 7 oz (77.8 kg)    Physical Exam Constitutional:      Appearance: Normal appearance.  HENT:  Head: Normocephalic.     Mouth/Throat:     Mouth: Mucous membranes are moist.     Pharynx: Oropharynx is clear.  Eyes:      Extraocular Movements: Extraocular movements intact.     Conjunctiva/sclera: Conjunctivae normal.  Cardiovascular:     Rate and Rhythm: Normal rate and regular rhythm.     Pulses: Normal pulses.     Heart sounds: Normal heart sounds.  Pulmonary:     Effort: Pulmonary effort is normal.     Breath sounds: Normal breath sounds.  Abdominal:     General: Bowel sounds are normal.     Palpations: Abdomen is soft.  Musculoskeletal: Normal range of motion.  Skin:    General: Skin is warm and dry.  Neurological:     General: No focal deficit present.     Mental Status: He is alert and oriented to person, place, and time.  Psychiatric:        Mood and Affect: Mood normal.        Behavior: Behavior normal.        Thought Content: Thought content normal.        Judgment: Judgment normal.      LABORATORY DATA:  I have reviewed the labs as listed.  CBC    Component Value Date/Time   WBC 10.7 (H) 11/08/2018 1250   RBC 3.89 (L) 11/08/2018 1250   HGB 12.1 (L) 11/08/2018 1250   HGB 11.6 (L) 02/04/2011 1059   HCT 36.5 (L) 11/08/2018 1250   HCT 34.7 (L) 02/04/2011 1059   PLT 219 11/08/2018 1250   PLT 282 02/04/2011 1059   MCV 93.8 11/08/2018 1250   MCV 76.0 (L) 02/04/2011 1059   MCH 31.1 11/08/2018 1250   MCHC 33.2 11/08/2018 1250   RDW 15.0 11/08/2018 1250   RDW 18.5 (H) 02/04/2011 1059   LYMPHSABS 2.2 11/08/2018 1250   LYMPHSABS 1.1 02/04/2011 1059   MONOABS 0.6 11/08/2018 1250   MONOABS 0.5 02/04/2011 1059   EOSABS 0.3 11/08/2018 1250   EOSABS 0.1 02/04/2011 1059   BASOSABS 0.0 11/08/2018 1250   BASOSABS 0.1 02/04/2011 1059   CMP Latest Ref Rng & Units 11/08/2018 11/02/2018 10/28/2018  Glucose 70 - 99 mg/dL 132(H) 181(H) 167(H)  BUN 8 - 23 mg/dL '13 13 20  '$ Creatinine 0.61 - 1.24 mg/dL 0.61 0.83 0.82  Sodium 135 - 145 mmol/L 139 136 133(L)  Potassium 3.5 - 5.1 mmol/L 3.5 4.6 3.7  Chloride 98 - 111 mmol/L 107 100 98  CO2 22 - 32 mmol/L '25 26 25  '$ Calcium 8.9 - 10.3 mg/dL 8.9  9.1 8.7(L)  Total Protein 6.5 - 8.1 g/dL 6.2(L) 6.7 6.1(L)  Total Bilirubin 0.3 - 1.2 mg/dL 0.9 1.8(H) 1.6(H)  Alkaline Phos 38 - 126 U/L 104 117 95  AST 15 - 41 U/L '15 17 21  '$ ALT 0 - 44 U/L '14 17 18       '$ ASSESSMENT & PLAN:   Rectal cancer 1.  Stage IV rectal cancer with lung and pancreatic metastasis: Foundation 1 CDX shows KRAS G13D and NRAS wild type, MS-stable. -12 cycles of FOLFOX in the adjuvant setting from October 12 through 07/14/2011. -15 cycles of FOLFOX and bevacizumab from 07/08/2016 through 01/20/2017 followed by infusional 5-FU and bevacizumab until 07/14/2017. -Maintenance Xeloda from 08/07/2017 with bevacizumab through 08/19/2018 with progression. - ERCP and stent placement on 09/16/2018 showing single localized biliary stricture.  Biliary sphincterotomy was performed with stricture dilated to 6 mm with balloon to facilitate  stent placement.  1 covered metal stent was placed. -Flexible sigmoidoscopy on 09/24/2018 showing narrowing of the colorectal anastomosis.  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 - 2 cycles of FOLFIRI from 10/04/2018 through 10/25/2018 with 20% dose reduction during cycle 2. - He has developed severe tiredness after cycle 2 even after dose reduction. - I have sent UGT1A1, which is pending at this time. -I would hold off on his chemotherapy today.  He still feeling very weak.  He is continuing to lose weight. -he will receive IV fluids today.  I will reevaluate him next week.  2.  Weight loss: - He tried mirtazapine and Marinol which did not help. -I have given a prescription for Megace.  He started taking it on 11/07/2018.  He has not noticed any change yet. -He is eating 2 meals a day, half portions.  He is currently drinking 2 ensures, 1 bottle of water and 1 bottle of Gatorade daily.  3.  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  He was diagnosed in 2016, status post IVC filter.  4.  Anxiety: -He is taking Klonopin 1 mg twice daily. -He is  feeling anxious before the evening dose.  I have told him to increase it to 3 times a day.  I have sent a new prescription.  Total time spent is 25 minutes with more than 50% of the time spent face-to-face discussing further work-up, side effect management and coordination of care.    Orders placed this encounter:  No orders of the defined types were placed in this encounter.     Derek Jack, MD  [place] [phone]

## 2018-11-09 NOTE — Assessment & Plan Note (Addendum)
1.  Stage IV rectal cancer with lung and pancreatic metastasis: Foundation 1 CDX shows KRAS G13D and NRAS wild type, MS-stable. -12 cycles of FOLFOX in the adjuvant setting from October 12 through 07/14/2011. -15 cycles of FOLFOX and bevacizumab from 07/08/2016 through 01/20/2017 followed by infusional 5-FU and bevacizumab until 07/14/2017. -Maintenance Xeloda from 08/07/2017 with bevacizumab through 08/19/2018 with progression. - ERCP and stent placement on 09/16/2018 showing single localized biliary stricture.  Biliary sphincterotomy was performed with stricture dilated to 6 mm with balloon to facilitate stent placement.  1 covered metal stent was placed. -Flexible sigmoidoscopy on 09/24/2018 showing narrowing of the colorectal anastomosis.  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 - 2 cycles of FOLFIRI from 10/04/2018 through 10/25/2018 with 20% dose reduction during cycle 2. - He has developed severe tiredness after cycle 2 even after dose reduction. - I have sent UGT1A1, which is pending at this time. -I would hold off on his chemotherapy today.  He still feeling very weak.  He is continuing to lose weight. -he will receive IV fluids today.  I will reevaluate him next week.  2.  Weight loss: - He tried mirtazapine and Marinol which did not help. -I have given a prescription for Megace.  He started taking it on 11/07/2018.  He has not noticed any change yet. -He is eating 2 meals a day, half portions.  He is currently drinking 2 ensures, 1 bottle of water and 1 bottle of Gatorade daily.  3.  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  He was diagnosed in 2016, status post IVC filter.  4.  Anxiety: -He is taking Klonopin 1 mg twice daily. -He is feeling anxious before the evening dose.  I have told him to increase it to 3 times a day.  I have sent a new prescription.

## 2018-11-09 NOTE — Progress Notes (Signed)
11/09/18  Patient with nausea/vomiting post cycle #2 with need for additional intervention.  Spoke with Dr [name] about addition of Emend 150 mg + Dexamethasone 12 mg IVPB to be added to treatment cycle.  Orders received to add to treatment plan and accomplished.  Treatment held today - received orders to give Zofran 8 mg IVPB for nausea.  T.O. Dr Rhys Martini, PharmD

## 2018-11-09 NOTE — Patient Instructions (Addendum)
[facility] at [facility] Discharge Instructions  You were seen today by Dr. Katragadda. He went over your recent lab results. He will see you back in 1 week for labs and follow up.   Thank you for choosing [facility] at [facility] to provide your oncology and hematology care.  To afford each patient quality time with our provider, please arrive at least 15 minutes before your scheduled appointment time.   If you have a lab appointment with the Cancer Center please come in thru the  Main Entrance and check in at the main information desk  You need to re-schedule your appointment should you arrive 10 or more minutes late.  We strive to give you quality time with our providers, and arriving late affects you and other patients whose appointments are after yours.  Also, if you no show three or more times for appointments you may be dismissed from the clinic at the providers discretion.     Again, thank you for choosing [facility].  Our hope is that these requests will decrease the amount of time that you wait before being seen by our physicians.       _____________________________________________________________  Should you have questions after your visit to [facility], please contact our office at [phone] between the hours of 8:00 a.m. and 4:30 p.m.  Voicemails left after 4:00 p.m. will not be returned until the following business day.  For prescription refill requests, have your pharmacy contact our office and allow 72 hours.    Cancer Center Support Programs:   > Cancer Support Group  2nd Tuesday of the month 1pm-2pm, Journey Room    

## 2018-11-09 NOTE — Progress Notes (Signed)
Pt presents today for tx and office visit. VSS. Message received from [facility] LPN. HOLD tx today and infuse hydration only.    Treatment given today per MD orders. Tolerated infusion without adverse affects. Vital signs stable. No complaints at this time. Discharged from clinic via wheel chair. F/U with [facility] as scheduled.

## 2018-11-11 ENCOUNTER — Encounter (HOSPITAL_COMMUNITY): Payer: Medicare Other

## 2018-11-12 ENCOUNTER — Other Ambulatory Visit: Payer: Self-pay

## 2018-11-12 ENCOUNTER — Encounter (HOSPITAL_COMMUNITY): Payer: Self-pay

## 2018-11-12 ENCOUNTER — Inpatient Hospital Stay (HOSPITAL_COMMUNITY): Payer: Medicare Other

## 2018-11-12 VITALS — BP 133/44 | HR 87 | Temp 97.7°F | Resp 18

## 2018-11-12 DIAGNOSIS — C20 Malignant neoplasm of rectum: Secondary | ICD-10-CM | POA: Diagnosis not present

## 2018-11-12 DIAGNOSIS — R634 Abnormal weight loss: Secondary | ICD-10-CM | POA: Diagnosis not present

## 2018-11-12 DIAGNOSIS — E860 Dehydration: Secondary | ICD-10-CM

## 2018-11-12 DIAGNOSIS — G893 Neoplasm related pain (acute) (chronic): Secondary | ICD-10-CM | POA: Diagnosis not present

## 2018-11-12 DIAGNOSIS — C7889 Secondary malignant neoplasm of other digestive organs: Secondary | ICD-10-CM | POA: Diagnosis not present

## 2018-11-12 DIAGNOSIS — C7801 Secondary malignant neoplasm of right lung: Secondary | ICD-10-CM | POA: Diagnosis not present

## 2018-11-12 DIAGNOSIS — R112 Nausea with vomiting, unspecified: Secondary | ICD-10-CM

## 2018-11-12 DIAGNOSIS — Z5111 Encounter for antineoplastic chemotherapy: Secondary | ICD-10-CM | POA: Diagnosis not present

## 2018-11-12 MED ORDER — HEPARIN SOD (PORK) LOCK FLUSH 100 UNIT/ML IV SOLN
500.0000 [IU] | Freq: Once | INTRAVENOUS | Status: AC | PRN
  Administered 2018-11-12: 500 [IU]

## 2018-11-12 MED ORDER — SODIUM CHLORIDE 0.9% FLUSH
10.0000 mL | Freq: Once | INTRAVENOUS | Status: AC | PRN
  Administered 2018-11-12: 10 mL

## 2018-11-12 MED ORDER — SODIUM CHLORIDE 0.9 % IV SOLN
Freq: Once | INTRAVENOUS | Status: AC
  Administered 2018-11-12: 10:00:00 via INTRAVENOUS
  Filled 2018-11-12: qty 1000

## 2018-11-12 NOTE — Progress Notes (Signed)
Patient her for hydration fluids today. Patient states he is feeling better today. Eating a little more, drinking more, had an Ensure this morning. No N/V, no diarrhea or constipation.   . Patient tolerated it well without problems. Vitals stable and discharged home from clinic via wheelchair. Follow up as scheduled.

## 2018-11-15 DIAGNOSIS — C250 Malignant neoplasm of head of pancreas: Secondary | ICD-10-CM | POA: Diagnosis not present

## 2018-11-15 DIAGNOSIS — Z433 Encounter for attention to colostomy: Secondary | ICD-10-CM | POA: Diagnosis not present

## 2018-11-15 DIAGNOSIS — G620 Drug-induced polyneuropathy: Secondary | ICD-10-CM | POA: Diagnosis not present

## 2018-11-15 DIAGNOSIS — T451X5S Adverse effect of antineoplastic and immunosuppressive drugs, sequela: Secondary | ICD-10-CM | POA: Diagnosis not present

## 2018-11-15 DIAGNOSIS — E1151 Type 2 diabetes mellitus with diabetic peripheral angiopathy without gangrene: Secondary | ICD-10-CM | POA: Diagnosis not present

## 2018-11-15 DIAGNOSIS — K9189 Other postprocedural complications and disorders of digestive system: Secondary | ICD-10-CM | POA: Diagnosis not present

## 2018-11-16 ENCOUNTER — Encounter (HOSPITAL_COMMUNITY): Payer: Self-pay | Admitting: Hematology

## 2018-11-16 ENCOUNTER — Inpatient Hospital Stay (HOSPITAL_COMMUNITY): Payer: Medicare Other

## 2018-11-16 ENCOUNTER — Other Ambulatory Visit: Payer: Self-pay

## 2018-11-16 ENCOUNTER — Inpatient Hospital Stay (HOSPITAL_BASED_OUTPATIENT_CLINIC_OR_DEPARTMENT_OTHER): Payer: Medicare Other | Admitting: Hematology

## 2018-11-16 VITALS — BP 131/82 | HR 83 | Temp 96.8°F | Resp 18

## 2018-11-16 VITALS — BP 129/82 | HR 107 | Temp 97.5°F | Resp 16 | Wt 169.2 lb

## 2018-11-16 DIAGNOSIS — C20 Malignant neoplasm of rectum: Secondary | ICD-10-CM

## 2018-11-16 DIAGNOSIS — Z933 Colostomy status: Secondary | ICD-10-CM | POA: Diagnosis not present

## 2018-11-16 DIAGNOSIS — R634 Abnormal weight loss: Secondary | ICD-10-CM | POA: Diagnosis not present

## 2018-11-16 DIAGNOSIS — C7800 Secondary malignant neoplasm of unspecified lung: Secondary | ICD-10-CM

## 2018-11-16 DIAGNOSIS — G893 Neoplasm related pain (acute) (chronic): Secondary | ICD-10-CM | POA: Diagnosis not present

## 2018-11-16 DIAGNOSIS — Z86711 Personal history of pulmonary embolism: Secondary | ICD-10-CM

## 2018-11-16 DIAGNOSIS — Z86718 Personal history of other venous thrombosis and embolism: Secondary | ICD-10-CM | POA: Diagnosis not present

## 2018-11-16 DIAGNOSIS — Z5111 Encounter for antineoplastic chemotherapy: Secondary | ICD-10-CM | POA: Diagnosis not present

## 2018-11-16 DIAGNOSIS — F419 Anxiety disorder, unspecified: Secondary | ICD-10-CM

## 2018-11-16 DIAGNOSIS — E860 Dehydration: Secondary | ICD-10-CM

## 2018-11-16 DIAGNOSIS — Z79899 Other long term (current) drug therapy: Secondary | ICD-10-CM | POA: Diagnosis not present

## 2018-11-16 DIAGNOSIS — R110 Nausea: Secondary | ICD-10-CM

## 2018-11-16 DIAGNOSIS — Z7901 Long term (current) use of anticoagulants: Secondary | ICD-10-CM

## 2018-11-16 DIAGNOSIS — C7801 Secondary malignant neoplasm of right lung: Secondary | ICD-10-CM | POA: Diagnosis not present

## 2018-11-16 DIAGNOSIS — C7889 Secondary malignant neoplasm of other digestive organs: Secondary | ICD-10-CM

## 2018-11-16 DIAGNOSIS — R112 Nausea with vomiting, unspecified: Secondary | ICD-10-CM

## 2018-11-16 LAB — CBC WITH DIFFERENTIAL/PLATELET
Abs Immature Granulocytes: 0.16 K/uL — ABNORMAL HIGH (ref 0.00–0.07)
Basophils Absolute: 0.1 K/uL (ref 0.0–0.1)
Basophils Relative: 1 %
Eosinophils Absolute: 0.2 K/uL (ref 0.0–0.5)
Eosinophils Relative: 1 %
HCT: 40.9 % (ref 39.0–52.0)
Hemoglobin: 13.5 g/dL (ref 13.0–17.0)
Immature Granulocytes: 1 %
Lymphocytes Relative: 12 %
Lymphs Abs: 2.3 K/uL (ref 0.7–4.0)
MCH: 30.8 pg (ref 26.0–34.0)
MCHC: 33 g/dL (ref 30.0–36.0)
MCV: 93.4 fL (ref 80.0–100.0)
Monocytes Absolute: 1.5 K/uL — ABNORMAL HIGH (ref 0.1–1.0)
Monocytes Relative: 8 %
Neutro Abs: 15.3 K/uL — ABNORMAL HIGH (ref 1.7–7.7)
Neutrophils Relative %: 77 %
Platelets: 282 K/uL (ref 150–400)
RBC: 4.38 MIL/uL (ref 4.22–5.81)
RDW: 15 % (ref 11.5–15.5)
WBC: 19.5 K/uL — ABNORMAL HIGH (ref 4.0–10.5)
nRBC: 0 % (ref 0.0–0.2)

## 2018-11-16 LAB — COMPREHENSIVE METABOLIC PANEL WITH GFR
ALT: 26 U/L (ref 0–44)
AST: 19 U/L (ref 15–41)
Albumin: 3.6 g/dL (ref 3.5–5.0)
Alkaline Phosphatase: 111 U/L (ref 38–126)
Anion gap: 8 (ref 5–15)
BUN: 18 mg/dL (ref 8–23)
CO2: 20 mmol/L — ABNORMAL LOW (ref 22–32)
Calcium: 9 mg/dL (ref 8.9–10.3)
Chloride: 107 mmol/L (ref 98–111)
Creatinine, Ser: 0.7 mg/dL (ref 0.61–1.24)
GFR calc Af Amer: >60 mL/min (ref >60)
GFR calc non Af Amer: >60 mL/min (ref >60)
Glucose, Bld: 209 mg/dL — ABNORMAL HIGH (ref 70–99)
Potassium: 4.3 mmol/L (ref 3.5–5.1)
Sodium: 135 mmol/L (ref 135–145)
Total Bilirubin: 1 mg/dL (ref 0.3–1.2)
Total Protein: 6.6 g/dL (ref 6.5–8.1)

## 2018-11-16 MED ORDER — HEPARIN SOD (PORK) LOCK FLUSH 100 UNIT/ML IV SOLN
500.0000 [IU] | Freq: Once | INTRAVENOUS | Status: AC | PRN
  Administered 2018-11-16: 500 [IU]

## 2018-11-16 MED ORDER — PROCHLORPERAZINE MALEATE 10 MG PO TABS: 10.0000 mg | ORAL_TABLET | Freq: Four times a day (QID) | ORAL | 0 refills | Status: DC | PRN

## 2018-11-16 MED ORDER — SODIUM CHLORIDE 0.9% FLUSH
10.0000 mL | Freq: Once | INTRAVENOUS | Status: AC | PRN
  Administered 2018-11-16: 10 mL

## 2018-11-16 MED ORDER — SODIUM CHLORIDE 0.9 % IV SOLN
Freq: Once | INTRAVENOUS | Status: AC
  Administered 2018-11-16: 10:00:00 via INTRAVENOUS
  Filled 2018-11-16: qty 1000

## 2018-11-16 NOTE — Patient Instructions (Signed)
[facility] at [facility] Discharge Instructions  Labs drawn from portacath today   Thank you for choosing [facility] at [facility] to provide your oncology and hematology care.  To afford each patient quality time with our provider, please arrive at least 15 minutes before your scheduled appointment time.   If you have a lab appointment with the Cancer Center please come in thru the  Main Entrance and check in at the main information desk  You need to re-schedule your appointment should you arrive 10 or more minutes late.  We strive to give you quality time with our providers, and arriving late affects you and other patients whose appointments are after yours.  Also, if you no show three or more times for appointments you may be dismissed from the clinic at the providers discretion.     Again, thank you for choosing [facility].  Our hope is that these requests will decrease the amount of time that you wait before being seen by our physicians.       _____________________________________________________________  Should you have questions after your visit to [facility], please contact our office at [phone] between the hours of 8:00 a.m. and 4:30 p.m.  Voicemails left after 4:00 p.m. will not be returned until the following business day.  For prescription refill requests, have your pharmacy contact our office and allow 72 hours.    Cancer Center Support Programs:   > Cancer Support Group  2nd Tuesday of the month 1pm-2pm, Journey Room   

## 2018-11-16 NOTE — Progress Notes (Signed)
[place], [place] [address]   CLINIC:  Medical Oncology/Hematology  PCP:  [name], MD [place] [address] [phone]   REASON FOR VISIT:  Follow-up for Stage IV rectal cancer  CURRENT THERAPY: FOLFIRI  BRIEF ONCOLOGIC HISTORY:  Oncology History  Rectal cancer ([place])  07/24/2010 Initial Diagnosis   Invasive adenocarcinoma of rectum   09/09/2010 Concurrent Chemotherapy   S/P radiation and concurrent 5-FU continuous infusion from 09/09/10- 10/10/10.   11/14/2010 Surgery   S/P proctectomy with colorectal anastomosis and diverting loop ileostomy on 11/14/10 at [facility] by Dr. Harlon Ditty. Pathology reveals a pT3b N1 with 3/20 lymph nodes.   02/05/2011 - 07/14/2011 Chemotherapy   FOLFOX   08/18/2011 Surgery   Approximate date of surgery- [place] by Dr. Harlon Ditty    Remission     11/24/2013 Survivorship   Colonoscopy- somewhat fibrotic/friable anastomotic mucosal-status post biopsy (narrowing not felt to be clinically significant).  Negative pathology for malignancy   09/12/2014 Imaging   DVT in the left femoral venous system, left common iliac vein, IVC, and within the IVC filter Right lower extremity venography confirms chronic occlusion of the femoral venous system with collateralization. The right iliac venous system is patent and do   09/25/2014 PET scan   The right middle lobe pulmonary nodule is hypermetabolic, favored to represent a primary bronchogenic carcinoma.Equivocal mediastinal nodes, similar to surrounding blood pool. Bilateral adrenal hypermetabolism, felt to be physiologic   10/24/2014 Pathology Results   Lung, needle/core biopsy(ies), RML - ADENOCARCINOMA, SEE COMMENT metastatic adenocarcinoma of a colorectal primary   10/24/2014 Relapse/Recurrence     11/16/2014 Definitive Surgery   Bronchoscopy, right video-assisted thoracoscopy, wedge resection of right middle lobe by Dr. Servando Snare   11/16/2014  Pathology Results   Lung, wedge biopsy/resection, right lingula and small portion of middle lobe - METASTATIC ADENOCARCINOMA, CONSISTENT WITH COLORECTAL PRIMARY, SPANNING 2.0 CM. - THE SURGICAL RESECTION MARGINS ARE NEGATIVE FOR ADENOCARCINOMA.   11/16/2014 Remission   THE SURGICAL RESECTION MARGINS ARE NEGATIVE FOR ADENOCARCINOMA.   03/07/2015 Imaging   CT CAP- Interval resection of right middle lobe metastasis. No acute process or evidence of metastatic disease in the chest, abdomen or pelvis. Improved right upper lobe reticular nodular opacities are favored to represent resolving infection.   09/14/2015 Imaging   CT CAP- No findings of recurrent malignancy. No recurrence along the wedge resection site of the right middle lobe. Right anterior abdominal wall focal hernia containing a knuckle of small bowel without complicating feature.   06/13/2016 Imaging   CT CAP- 1. New right-sided pleural metastasis/mass. Other smaller right-sided pulmonary nodules which are all pleural-based and most likely represent pleural metastasis. 2. No convincing evidence of abdominopelvic nodal metastasis. 3. Constellation of findings, including pancreatic atrophy, duct dilatation, and pancreatic head soft tissue fullness which are highly suspicious for pancreatic adenocarcinoma. Metastatic disease felt much less likely. Consider endoscopic ultrasound sampling or ERCP. Cannot exclude superimposed acute pancreatitis. 4. New enlargement of the appendiceal tip with subtle surrounding edema. Cannot exclude early or mild appendicitis. 5. Coronary artery atherosclerosis. Aortic atherosclerosis. 6. 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   06/13/2016 Progression   CT scan demonstrates progression of disease   06/19/2016 Procedure   EUS with FNA by Dr. Ardis Hughs   06/20/2016 Pathology Results   FINE NEEDLE ASPIRATION, ENDOSCOPIC, PANCREAS UNCINATE AREA(SPECIMEN 1 OF 1 COLLECTED 06/19/16):  MALIGNANT CELLS CONSISTENT WITH METASTATIC ADENOCARCINOMA.  06/25/2016 PET scan   1. Two pleural-based nodules in the right hemithorax are hypermetabolic. Metastatic disease is a distinct consideration. The scattered pulmonary parenchymal nodule seen on previous diagnostic CT imaging are below the threshold for reliable resolution on PET imaging. 2. Hypermetabolic lesion pancreatic head, consistent with neoplasm. As noted on prior CT, adenocarcinoma is any consideration. No evidence for hypermetabolic abdominal lymphadenopathy. Mottled uptake noted in the liver, but no discrete hepatic metastases are evident on PET imaging. 3. Appendix remains distended up to 0.9-10 mm diameter with a stone towards the tip in subtle periappendiceal edema/inflammation. The appendix is hypermetabolic along its length. Imaging features are relatively stable in the 12 day interval since prior CT scan. While appendicitis is a consideration, the relative stability over 12 days would be unusual for that etiology. Continued close follow-up recommended.   06/26/2016 Pathology Results   Not enough tissue for foundationONE or K-ras testing.   07/07/2016 Procedure   Port placed by Dr. Arnoldo Morale   07/08/2016 -  Chemotherapy   The patient had pegfilgrastim (NEULASTA) injection 6 mg, 6 mg, Subcutaneous,  Once, 0 of 4 cycles  ondansetron (ZOFRAN) IVPB 8 mg, 8 mg, Intravenous,  Once, 0 of 4 cycles  leucovorin 804 mg in dextrose 5 % 250 mL infusion, 400 mg/m2, Intravenous,  Once, 0 of 4 cycles  oxaliplatin (ELOXATIN) 170 mg in dextrose 5 % 500 mL chemo infusion, 85 mg/m2, Intravenous,  Once, 0 of 4 cycles  fluorouracil (ADRUCIL) 4,800 mg in sodium chloride 0.9 % 150 mL chemo infusion, 2,400 mg/m2 = 4,800 mg, Intravenous, 1 Day/Dose, 0 of 4 cycles  fluorouracil (ADRUCIL) chemo injection 800 mg, 400 mg/m2, Intravenous,  Once, 0 of 4 cycles  pegfilgrastim (NEULASTA) injection 6 mg, 6 mg, Subcutaneous,  Once, 2 of 2  cycles  ondansetron (ZOFRAN) IVPB 8 mg, 8 mg, Intravenous,  Once, 12 of 12 cycles  leucovorin 660 mg in dextrose 5 % 250 mL infusion, 660 mg (original dose ), Intravenous,  Once, 12 of 12 cycles Dose modification: 660 mg (Cycle 1)  oxaliplatin (ELOXATIN) 140 mg in dextrose 5 % 500 mL chemo infusion, 140 mg (original dose ), Intravenous,  Once, 12 of 12 cycles Dose modification: 140 mg (Cycle 1), 119 mg (85 % of original dose 140 mg, Cycle 8, Reason: Provider Judgment)  fluorouracil (ADRUCIL) 3,850 mg in sodium chloride 0.9 % 150 mL chemo infusion, 3,850 mg (original dose ), Intravenous, 1 Day/Dose, 12 of 12 cycles Dose modification: 3,850 mg (Cycle 1)  fluorouracil (ADRUCIL) chemo injection 700 mg, 700 mg (original dose ), Intravenous,  Once, 12 of 12 cycles Dose modification: 700 mg (Cycle 1)  palonosetron (ALOXI) injection 0.25 mg, 0.25 mg, Intravenous,  Once, 7 of 12 cycles Administration: 0.25 mg (09/30/2016)  bevacizumab (AVASTIN) 475 mg in sodium chloride 0.9 % 100 mL chemo infusion, 5 mg/kg = 475 mg, Intravenous,  Once, 6 of 11 cycles Administration: 500 mg (09/30/2016)  leucovorin 880 mg in dextrose 5 % 250 mL infusion, 400 mg/m2 = 880 mg, Intravenous,  Once, 7 of 12 cycles Administration: 880 mg (09/30/2016)  oxaliplatin (ELOXATIN) 185 mg in dextrose 5 % 500 mL chemo infusion, 85 mg/m2 = 185 mg, Intravenous,  Once, 7 of 12 cycles Administration: 185 mg (09/30/2016)  fluorouracil (ADRUCIL) chemo injection 900 mg, 400 mg/m2 = 900 mg, Intravenous,  Once, 7 of 12 cycles Administration: 900 mg (09/30/2016)  fluorouracil (ADRUCIL) 5,300 mg in sodium chloride 0.9 % 144 mL chemo infusion, 2,400 mg/m2 = 5,300  mg, Intravenous, 1 Day/Dose, 7 of 12 cycles Administration: 5,300 mg (09/30/2016)  for chemotherapy treatment.     08/27/2016 Imaging   CT CAP- 1. Pulmonary metastatic disease is stable. 2. Pancreatic head mass, grossly stable, with progressive atrophy of the body and tail of the  pancreas. Stable peripancreatic lymph node. 3. Mild circumferential rectal wall thickening, stable. 4. Aortic atherosclerosis (ICD10-170.0). Coronary artery calcification. 5. Hyperattenuating lesion off the lower pole left kidney, stable, too small to characterize. Continued attention on followup exams is warranted. 6. Persistent wall thickening and mild dilatation of the proximal appendix, of uncertain etiology. Continued attention on followup exams is warranted.   11/06/2016 Imaging   CT C/A/P: IMPRESSION: 1. Stable pulmonary metastatic disease. 2. Similar appearing pancreatic head mass. Progressed atrophy of the pancreatic body and tail. Stable peripancreatic lymph node. 3. Stable hypoattenuating lesion partially exophytic off the inferior pole of the left kidney. 4. Aortic atherosclerosis. 5. Persistent mild wall thickening and mild dilatation of the appendix.    01/30/2017 Imaging   Ct C/A/P: IMPRESSION: 1. Stable pulmonary metastatic disease. 2. Stable to slight increase in size of pancreatic head mass. 3.  Aortic Atherosclerosis (ICD10-I70.0). 4. Similar appearance of mild wall thickening of the appendix which contains an appendicolith.   05/01/2017 Imaging   CT C/A/P: Stable disease in the lungs, no progression of disease in the abdomen.   10/04/2018 -  Chemotherapy   The patient had palonosetron (ALOXI) injection 0.25 mg, 0.25 mg, Intravenous,  Once, 2 of 4 cycles Administration: 0.25 mg (10/04/2018), 0.25 mg (10/25/2018) pegfilgrastim (NEULASTA) injection 6 mg, 6 mg, Subcutaneous, Once, 1 of 3 cycles Administration: 6 mg (10/27/2018) bevacizumab (AVASTIN) 450 mg in sodium chloride 0.9 % 100 mL chemo infusion, 5 mg/kg = 450 mg, Intravenous,  Once, 1 of 3 cycles irinotecan (CAMPTOSAR) 380 mg in sodium chloride 0.9 % 500 mL chemo infusion, 180 mg/m2 = 380 mg, Intravenous,  Once, 2 of 4 cycles Dose modification: 144 mg/m2 (original dose 180 mg/m2, Cycle 2, Reason: Provider  Judgment, Comment: 20% dose reduction) Administration: 380 mg (10/04/2018), 300 mg (10/25/2018) leucovorin 900 mg in sodium chloride 0.9 % 250 mL infusion, 421 mg/m2 = 856 mg, Intravenous,  Once, 2 of 4 cycles Dose modification: 320 mg/m2 (original dose 400 mg/m2, Cycle 2, Reason: Provider Judgment, Comment: 20% dose reduction) Administration: 900 mg (10/04/2018), 700 mg (10/25/2018) fluorouracil (ADRUCIL) chemo injection 850 mg, 400 mg/m2 = 850 mg, Intravenous,  Once, 2 of 4 cycles Dose modification: 320 mg/m2 (original dose 400 mg/m2, Cycle 2, Reason: Provider Judgment, Comment: 20% dose reduction) Administration: 850 mg (10/04/2018), 700 mg (10/25/2018) fosaprepitant (EMEND) 150 mg, dexamethasone (DECADRON) 12 mg in sodium chloride 0.9 % 145 mL IVPB, , Intravenous,  Once, 0 of 2 cycles fluorouracil (ADRUCIL) 5,000 mg in sodium chloride 0.9 % 150 mL chemo infusion, 2,325 mg/m2 = 5,150 mg, Intravenous, 1 Day/Dose, 2 of 4 cycles Dose modification: 1,920 mg/m2 (original dose 2,400 mg/m2, Cycle 2, Reason: Provider Judgment, Comment: 20% dose reduction) Administration: 5,000 mg (10/04/2018), 4,100 mg (10/25/2018)  for chemotherapy treatment.       CANCER STAGING: Cancer Staging Rectal cancer ([place]) Staging form: Colon and Rectum, AJCC 7th Edition - Clinical: Stage IIIB (T3, N1, M0) - Signed by [name], MD on 02/04/2011 - Pathologic stage from 11/17/2014: Stage IVA (M1a) - Signed by Baird Cancer, PA-C on 09/14/2015    INTERVAL HISTORY:  Charles [name] 68 y.o. male seen for follow-up of metastatic rectal cancer.  Last chemotherapy  was on 10/25/2018.  We held chemotherapy last 2 times because of severe weakness.  Appetite and energy levels are reported 25%.  He is drinking 2 Ensure and 2 breakfasts shakes per day.  He started taking Megace on 11/07/2018.  He lost 2 pounds from last visit 1 week ago.  He reports that his appetite has slightly improved.  He is drinking 2 cans of Ensure until breakfast  shakes daily.  He is eating minimal solid food.  For his nausea, he used the scopolamine patch which did not help and made him dizzy.  Anxiety is better controlled after increasing the dose of Klonopin.  REVIEW OF SYSTEMS:  Review of Systems  Constitutional: Positive for appetite change and fatigue.  HENT:  Negative.   Eyes: Negative.   Respiratory: Negative.   Cardiovascular: Negative.   Gastrointestinal: Positive for nausea.  Genitourinary: Negative.    Musculoskeletal: Negative.   Skin: Negative.   Neurological: Positive for dizziness.  Hematological: Negative.   Psychiatric/Behavioral: The patient is nervous/anxious.   All other systems reviewed and are negative.    PAST MEDICAL/SURGICAL HISTORY:  Past Medical History:  Diagnosis Date  . Chronic anticoagulation   . Colon cancer ([place]) 07/24/2010   rectal ca, inv adenocarcinoma  . Depression 04/01/2011  . Diabetes mellitus without complication ([place])   . DVT (deep venous thrombosis) ([place]) 05/09/2011  . GERD (gastroesophageal reflux disease)   . High output ileostomy ([place]) 05/21/2011  . History of kidney stones   . HTN (hypertension)   . Hx of radiation therapy 09/02/10 to 10/14/10   pelvis  . Lung metastasis ([place])   . Neuropathy   . Peripheral vascular disease ([place])    dvt's,pe  . Pneumonia    hx x3  . Pulmonary embolism ([place])   . Rectal cancer ([place]) 01/15/2011   S/P radiation and concurrent 5-FU continuous infusion from 09/09/10- 10/10/10.  S/P proctectomy with colorectal anastomosis and diverting loop ileostomy on 11/14/10 at [facility] by Dr. Harlon Ditty. Pathology reveals a pT3b N1 with 3/20 lymph nodes.     Past Surgical History:  Procedure Laterality Date  . BILIARY STENT PLACEMENT N/A 09/16/2018   Procedure: STENT PLACEMENT;  Surgeon: Rogene Houston, MD;  [facility];  Service: Endoscopy;  Laterality: N/A;  . BIOPSY  09/24/2018   Procedure: BIOPSY;  Surgeon: Danie Binder, MD;  [facility];   Service: Endoscopy;;  . COLON SURGERY  11/14/2010   proctectomy with colorectal anastomosis and diverting loop ileostomy (temporary planned)  . COLONOSCOPY  07/2010   proximal rectal apple core mass 10-14cm from anal verge (adenocarcinoma), 2-3cm distal rectal carpet polyp s/p piecemeal snare polypectomy (adenoma)  . COLONOSCOPY  04/21/2012   RMR: Friable,fibrotic appearing colorectal anastomosis producing some luminal narrowing-not felt to be critical. path: focal erosion with slight inflammation and hyperemia. SURVEILLANCE DUE DEC 2015  . COLONOSCOPY N/A 11/24/2013   Dr. Rourk:somewhat fibrotic/friable anastomotic mucosal-status post biopsy (narrowing not felt to be clinically significant) Single colonic diverticulum. benign polypoid rectal mucosa  . colostomy reversal  april 2013  . ERCP N/A 09/16/2018   Procedure: ENDOSCOPIC RETROGRADE CHOLANGIOPANCREATOGRAPHY (ERCP);  Surgeon: Rogene Houston, MD;  [facility];  Service: Endoscopy;  Laterality: N/A;  . ESOPHAGOGASTRODUODENOSCOPY  07/2010   RMR: schatki ring s/p dilation, small hh, SB bx benign  . ESOPHAGOGASTRODUODENOSCOPY N/A 09/04/2015   Procedure: ESOPHAGOGASTRODUODENOSCOPY (EGD);  Surgeon: Daneil Dolin, MD;  [facility];  Service: Endoscopy;  Laterality:  N/A;  730  . EUS  08/2010   Dr. Owens Loffler. uT3N0 circumferential, nearly obstruction rectosigmoid adenocarcinoma, distal edge 12cm from anal verge  . EUS N/A 06/19/2016   Procedure: UPPER ENDOSCOPIC ULTRASOUND (EUS) RADIAL;  Surgeon: Milus Banister, MD;  Location: [facility];  Service: Endoscopy;  Laterality: N/A;  . FLEXIBLE SIGMOIDOSCOPY N/A 09/24/2018   Procedure: FLEXIBLE SIGMOIDOSCOPY;  Surgeon: Danie Binder, MD;  [facility];  Service: Endoscopy;  Laterality: N/A;  . HERNIA REPAIR     abd hernia repair  . IVC filter    . ivc filter    . port a cath placement    . PORT-A-CATH REMOVAL  09/24/2011   Procedure: REMOVAL PORT-A-CATH;   Surgeon: Donato Heinz, MD;  Location: [facility];  Service: General;  Laterality: N/A;  Minor Room  . PORTACATH PLACEMENT Right 07/07/2016   Procedure: INSERTION PORT-A-CATH;  Surgeon: Aviva Signs, MD;  Location: [facility];  Service: General;  Laterality: Right;  . SPHINCTEROTOMY N/A 09/16/2018   Procedure: SPHINCTEROTOMY with balloon dialation;  Surgeon: Rogene Houston, MD;  [facility];  Service: Endoscopy;  Laterality: N/A;  . TRANSVERSE LOOP COLOSTOMY N/A 09/29/2018   Procedure: TRANSVERSE LOOP COLOSTOMY;  Surgeon: Aviva Signs, MD;  Location: [facility];  Service: General;  Laterality: N/A;  . VIDEO ASSISTED THORACOSCOPY (VATS)/WEDGE RESECTION Right 11/15/2014   Procedure: VIDEO ASSISTED THORACOSCOPY (VATS)/LUNG RESECTION WITH RIGHT LINGULECTOMY;  Surgeon: [name], MD;  Location: [facility];  Service: Thoracic;  Laterality: Right;  Marland Kitchen VIDEO BRONCHOSCOPY N/A 11/15/2014   Procedure: VIDEO BRONCHOSCOPY;  Surgeon: [name], MD;  Location: [facility] OR;  Service: Thoracic;  Laterality: N/A;     SOCIAL HISTORY:  Social History   Socioeconomic History  . Marital status: Married    Spouse name: Not on file  . Number of children: 2  . Years of education: Not on file  . Highest education level: Not on file  Occupational History  . Occupation: self-employed Regulatory affairs officer, currently not working    Fish farm manager: SELF EMPLOYED  Social Needs  . Financial resource strain: Not very hard  . Food insecurity    Worry: Never true    Inability: Never true  . Transportation needs    Medical: No    Non-medical: No  Tobacco Use  . Smoking status: Never Smoker  . Smokeless tobacco: Never Used  Substance and Sexual Activity  . Alcohol use: No  . Drug use: No  . Sexual activity: Yes    Birth control/protection: None    Comment: married  Lifestyle  . Physical activity    Days per week: 0 days    Minutes per session: 0 min  . Stress: Only a little  Relationships  . Social  connections    Talks on phone: More than three times a week    Gets together: Once a week    Attends religious service: Patient refused    Active member of club or organization: Patient refused    Attends meetings of clubs or organizations: Patient refused    Relationship status: Patient refused  . Intimate partner violence    Fear of current or ex partner: Patient refused    Emotionally abused: Patient refused    Physically abused: Patient refused    Forced sexual activity: Patient refused  Other Topics Concern  . Not on file  Social History Narrative  . Not on file    FAMILY HISTORY:  Family History  Problem Relation Age of Onset  . Cancer Brother        throat  . Cancer Brother        prostate  . Colon cancer Neg Hx   . Liver disease Neg Hx   . Inflammatory bowel disease Neg Hx     CURRENT MEDICATIONS:  Outpatient Encounter Medications as of 11/16/2018  Medication Sig  . acetaminophen (TYLENOL) 500 MG tablet Take 1,000 mg by mouth every 6 (six) hours as needed for mild pain or moderate pain.   . clonazePAM (KLONOPIN) 1 MG tablet Take 1 tablet (1 mg total) by mouth 3 (three) times daily as needed.  . Diphenhyd-Hydrocort-Nystatin (FIRST-DUKES MOUTHWASH) SUSP Use as directed 5 mLs in the mouth or throat 4 (four) times daily. (Patient not taking: Reported on 11/16/2018)  . diphenoxylate-atropine (LOMOTIL) 2.5-0.025 MG tablet Take 1 tablet by mouth 4 (four) times daily as needed for diarrhea or loose stools. (Patient not taking: Reported on 11/16/2018)  . dronabinol (MARINOL) 5 MG capsule Take 1 capsule (5 mg total) by mouth 2 (two) times daily before a meal.  . gabapentin (NEURONTIN) 300 MG capsule Take 3 capsules (900 mg total) by mouth 2 (two) times daily.  Marland Kitchen glimepiride (AMARYL) 2 MG tablet Take 2 mg by mouth daily with breakfast.   . megestrol (MEGACE) 400 MG/10ML suspension Take 10 mLs (400 mg total) by mouth 2 (two) times daily.  Marland Kitchen omeprazole (PRILOSEC) 20 MG capsule Take  20 mg by mouth 2 (two) times daily before a meal.   . ondansetron (ZOFRAN) 8 MG tablet Take 1 tablet (8 mg total) by mouth 2 (two) times daily as needed for refractory nausea / vomiting. Start on day 3 after chemotherapy. (Patient not taking: Reported on 11/16/2018)  . oxyCODONE-acetaminophen (PERCOCET) 10-325 MG tablet Take 1 tablet by mouth every 6 (six) hours as needed for pain.  . Probiotic Product (PROBIOTIC DAILY PO) Take 1 capsule by mouth daily.  . prochlorperazine (COMPAZINE) 10 MG tablet Take 1 tablet (10 mg total) by mouth every 6 (six) hours as needed for nausea or vomiting.  . promethazine (PHENERGAN) 25 MG tablet Take 1 tablet (25 mg total) by mouth every 8 (eight) hours as needed for refractory nausea / vomiting.  . rivaroxaban (XARELTO) 20 MG TABS tablet Take 1 tablet (20 mg total) by mouth daily with supper. (Patient not taking: Reported on 11/16/2018)  . traZODone (DESYREL) 100 MG tablet Take 1 tablet (100 mg total) by mouth at bedtime. (Patient not taking: Reported on 11/16/2018)  . vitamin B-12 (CYANOCOBALAMIN) 1000 MCG tablet Take 1,000 mcg by mouth daily.   Facility-Administered Encounter Medications as of 11/16/2018  Medication  . sodium chloride 0.9 % injection 10 mL  . sodium chloride flush (NS) 0.9 % injection 10 mL    ALLERGIES:  Allergies  Allergen Reactions  . Oxycodone     Blisters, hallucinations  . Tramadol     Blisters, hallucinations  . Trazodone And Nefazodone Other (See Comments)    hallucinations     PHYSICAL EXAM:  ECOG Performance status: 2  Vitals:   11/16/18 0800  BP: 129/82  Pulse: (!) 107  Resp: 16  Temp: (!) 97.5 F (36.4 C)  SpO2: 100%   Filed Weights   11/16/18 0800  Weight: 169 lb 3.2 oz (76.7 kg)    Physical Exam Constitutional:      Appearance: Normal appearance.  HENT:     Head: Normocephalic.     Mouth/Throat:  Mouth: Mucous membranes are moist.     Pharynx: Oropharynx is clear.  Eyes:     Extraocular Movements:  Extraocular movements intact.     Conjunctiva/sclera: Conjunctivae normal.  Cardiovascular:     Rate and Rhythm: Normal rate and regular rhythm.     Pulses: Normal pulses.     Heart sounds: Normal heart sounds.  Pulmonary:     Effort: Pulmonary effort is normal.     Breath sounds: Normal breath sounds.  Abdominal:     General: Bowel sounds are normal.     Palpations: Abdomen is soft.  Musculoskeletal: Normal range of motion.  Skin:    General: Skin is warm and dry.  Neurological:     General: No focal deficit present.     Mental Status: He is alert and oriented to person, place, and time.  Psychiatric:        Mood and Affect: Mood normal.        Behavior: Behavior normal.        Thought Content: Thought content normal.        Judgment: Judgment normal.      LABORATORY DATA:  I have reviewed the labs as listed.  CBC    Component Value Date/Time   WBC 19.5 (H) 11/16/2018 0800   RBC 4.38 11/16/2018 0800   HGB 13.5 11/16/2018 0800   HGB 11.6 (L) 02/04/2011 1059   HCT 40.9 11/16/2018 0800   HCT 34.7 (L) 02/04/2011 1059   PLT 282 11/16/2018 0800   PLT 282 02/04/2011 1059   MCV 93.4 11/16/2018 0800   MCV 76.0 (L) 02/04/2011 1059   MCH 30.8 11/16/2018 0800   MCHC 33.0 11/16/2018 0800   RDW 15.0 11/16/2018 0800   RDW 18.5 (H) 02/04/2011 1059   LYMPHSABS 2.3 11/16/2018 0800   LYMPHSABS 1.1 02/04/2011 1059   MONOABS 1.5 (H) 11/16/2018 0800   MONOABS 0.5 02/04/2011 1059   EOSABS 0.2 11/16/2018 0800   EOSABS 0.1 02/04/2011 1059   BASOSABS 0.1 11/16/2018 0800   BASOSABS 0.1 02/04/2011 1059   CMP Latest Ref Rng & Units 11/16/2018 11/08/2018 11/02/2018  Glucose 70 - 99 mg/dL 209(H) 132(H) 181(H)  BUN 8 - 23 mg/dL '18 13 13  '$ Creatinine 0.61 - 1.24 mg/dL 0.70 0.61 0.83  Sodium 135 - 145 mmol/L 135 139 136  Potassium 3.5 - 5.1 mmol/L 4.3 3.5 4.6  Chloride 98 - 111 mmol/L 107 107 100  CO2 22 - 32 mmol/L 20(L) 25 26  Calcium 8.9 - 10.3 mg/dL 9.0 8.9 9.1  Total Protein 6.5 - 8.1  g/dL 6.6 6.2(L) 6.7  Total Bilirubin 0.3 - 1.2 mg/dL 1.0 0.9 1.8(H)  Alkaline Phos 38 - 126 U/L 111 104 117  AST 15 - 41 U/L '19 15 17  '$ ALT 0 - 44 U/L '26 14 17       '$ ASSESSMENT & PLAN:   Rectal cancer 1.  Stage IV rectal cancer with lung and pancreatic metastasis: Foundation 1 CDX shows KRAS G13D and NRAS wild type, MS-stable. -12 cycles of FOLFOX in the adjuvant setting from October 12 through 07/14/2011. -15 cycles of FOLFOX and bevacizumab from 07/08/2016 through 01/20/2017 followed by infusional 5-FU and bevacizumab until 07/14/2017. -Maintenance Xeloda from 08/07/2017 with bevacizumab through 08/19/2018 with progression. - ERCP and stent placement on 09/16/2018 showing single localized biliary stricture.  Biliary sphincterotomy was performed with stricture dilated to 6 mm with balloon to facilitate stent placement.  1 covered metal stent was placed. -Flexible sigmoidoscopy on 09/24/2018  showing narrowing of the colorectal anastomosis.  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 - 2 cycles of FOLFIRI from 10/04/2018 through 10/25/2018 with 20% dose reduction during cycle 2. - He has developed severe tiredness after cycle 2 even after dose reduction. -I have sentUGT1A1 testing on 10/06/2018.  Unfortunately this is still pending as they had to redo it.  This will likely be reported tomorrow. - There is mild improvement in his energy levels although not quite enough to receive chemotherapy.  I would hold his treatment today and reevaluate him next week.  We will give him fluids today and on Friday.  He feels better for few days after fluid infusion.   2.  Weight loss: -He lost 2 pounds in the last 1 week.  He tried mirtazapine and Marinol which did not work. -He started taking Megace twice daily on 11/07/2018.  There is mild improvement in appetite and eating. - He is drinking 2 ensures per day and also drinks 2 breakfast shakes per day.  He is eating small quantities of solid food.  3.  DVT and  PE: - He will continue Xarelto.  He was diagnosed in 2016, status post IVC filter.  4.  Anxiety: -He is taking Klonopin 1 mg 3 times a day.  This is helping most days.  Total time spent is 25 minutes with more than 50% of the time spent face-to-face discussing further work-up, side effect management and coordination of care.    Orders placed this encounter:  Orders Placed This Encounter  Procedures  . CBC with Differential/Platelet  . Comprehensive metabolic panel      Derek Jack, MD  [place] [phone]

## 2018-11-16 NOTE — Progress Notes (Signed)
No treatment per MD. Hydration fluids today and on Friday per MD.    Vitals stable and discharged home from clinic via wheelchair. Follow up as scheduled.

## 2018-11-16 NOTE — Patient Instructions (Addendum)
[place] at [facility] Discharge Instructions  You were seen today by Dr. [name]. He went over your recent lab results. He will see you back in 1 weeks for labs and follow up.   Thank you for choosing [place] at [facility] to provide your oncology and hematology care.  To afford each patient quality time with our provider, please arrive at least 15 minutes before your scheduled appointment time.   If you have a lab appointment with the [facility] please come in thru the  Main Entrance and check in at the main information desk  You need to re-schedule your appointment should you arrive 10 or more minutes late.  We strive to give you quality time with our providers, and arriving late affects you and other patients whose appointments are after yours.  Also, if you no show three or more times for appointments you may be dismissed from the clinic at the providers discretion.     Again, thank you for choosing [facility].  Our hope is that these requests will decrease the amount of time that you wait before being seen by our physicians.       _____________________________________________________________  Should you have questions after your visit to [facility], please contact our office at [phone] between the hours of 8:00 a.m. and 4:30 p.m.  Voicemails left after 4:00 p.m. will not be returned until the following business day.  For prescription refill requests, have your pharmacy contact our office and allow 72 hours.    Cancer Center Support Programs:   > Cancer Support Group  2nd Tuesday of the month 1pm-2pm, Journey Room

## 2018-11-16 NOTE — Patient Instructions (Signed)
[facility] Discharge Instructions for Patients Receiving Chemotherapy  Today you received the following chemotherapy agents   To help prevent nausea and vomiting after your treatment, we encourage you to take your nausea medication   If you develop nausea and vomiting that is not controlled by your nausea medication, call the clinic.   BELOW ARE SYMPTOMS THAT SHOULD BE REPORTED IMMEDIATELY:  *FEVER GREATER THAN 100.5 F  *CHILLS WITH OR WITHOUT FEVER  NAUSEA AND VOMITING THAT IS NOT CONTROLLED WITH YOUR NAUSEA MEDICATION  *UNUSUAL SHORTNESS OF BREATH  *UNUSUAL BRUISING OR BLEEDING  TENDERNESS IN MOUTH AND THROAT WITH OR WITHOUT PRESENCE OF ULCERS  *URINARY PROBLEMS  *BOWEL PROBLEMS  UNUSUAL RASH Items with * indicate a potential emergency and should be followed up as soon as possible.  Feel free to call the clinic should you have any questions or concerns. The clinic phone number is [phone].  Please show the CHEMO ALERT CARD at check-in to the Emergency Department and triage nurse.   

## 2018-11-16 NOTE — Assessment & Plan Note (Signed)
1.  Stage IV rectal cancer with lung and pancreatic metastasis: Foundation 1 CDX shows KRAS G13D and NRAS wild type, MS-stable. -12 cycles of FOLFOX in the adjuvant setting from October 12 through 07/14/2011. -15 cycles of FOLFOX and bevacizumab from 07/08/2016 through 01/20/2017 followed by infusional 5-FU and bevacizumab until 07/14/2017. -Maintenance Xeloda from 08/07/2017 with bevacizumab through 08/19/2018 with progression. - ERCP and stent placement on 09/16/2018 showing single localized biliary stricture.  Biliary sphincterotomy was performed with stricture dilated to 6 mm with balloon to facilitate stent placement.  1 covered metal stent was placed. -Flexible sigmoidoscopy on 09/24/2018 showing narrowing of the colorectal anastomosis.  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 - 2 cycles of FOLFIRI from 10/04/2018 through 10/25/2018 with 20% dose reduction during cycle 2. - He has developed severe tiredness after cycle 2 even after dose reduction. -I have sentUGT1A1 testing on 10/06/2018.  Unfortunately this is still pending as they had to redo it.  This will likely be reported tomorrow. - There is mild improvement in his energy levels although not quite enough to receive chemotherapy.  I would hold his treatment today and reevaluate him next week.  We will give him fluids today and on Friday.  He feels better for few days after fluid infusion.   2.  Weight loss: -He lost 2 pounds in the last 1 week.  He tried mirtazapine and Marinol which did not work. -He started taking Megace twice daily on 11/07/2018.  There is mild improvement in appetite and eating. - He is drinking 2 ensures per day and also drinks 2 breakfast shakes per day.  He is eating small quantities of solid food.  3.  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  He was diagnosed in 2016, status post IVC filter.  4.  Anxiety: -He is taking Klonopin 1 mg 3 times a day.  This is helping most days.

## 2018-11-17 LAB — MISC LABCORP TEST (SEND OUT): Labcorp test code: 511200

## 2018-11-18 ENCOUNTER — Encounter (HOSPITAL_COMMUNITY): Payer: Medicare Other

## 2018-11-19 ENCOUNTER — Encounter (HOSPITAL_COMMUNITY): Payer: Self-pay

## 2018-11-19 ENCOUNTER — Inpatient Hospital Stay (HOSPITAL_COMMUNITY): Payer: Medicare Other

## 2018-11-19 ENCOUNTER — Other Ambulatory Visit: Payer: Self-pay

## 2018-11-19 VITALS — BP 136/75 | HR 88 | Temp 98.2°F | Resp 18

## 2018-11-19 DIAGNOSIS — R112 Nausea with vomiting, unspecified: Secondary | ICD-10-CM

## 2018-11-19 DIAGNOSIS — Z5111 Encounter for antineoplastic chemotherapy: Secondary | ICD-10-CM | POA: Diagnosis not present

## 2018-11-19 DIAGNOSIS — R634 Abnormal weight loss: Secondary | ICD-10-CM | POA: Diagnosis not present

## 2018-11-19 DIAGNOSIS — C20 Malignant neoplasm of rectum: Secondary | ICD-10-CM | POA: Diagnosis not present

## 2018-11-19 DIAGNOSIS — C7801 Secondary malignant neoplasm of right lung: Secondary | ICD-10-CM | POA: Diagnosis not present

## 2018-11-19 DIAGNOSIS — G893 Neoplasm related pain (acute) (chronic): Secondary | ICD-10-CM | POA: Diagnosis not present

## 2018-11-19 DIAGNOSIS — E860 Dehydration: Secondary | ICD-10-CM

## 2018-11-19 DIAGNOSIS — C7889 Secondary malignant neoplasm of other digestive organs: Secondary | ICD-10-CM | POA: Diagnosis not present

## 2018-11-19 MED ORDER — SODIUM CHLORIDE 0.9% FLUSH
10.0000 mL | Freq: Once | INTRAVENOUS | Status: AC | PRN
  Administered 2018-11-19: 10 mL

## 2018-11-19 MED ORDER — SODIUM CHLORIDE 0.9 % IV SOLN
Freq: Once | INTRAVENOUS | Status: AC
  Administered 2018-11-19: 10:00:00 via INTRAVENOUS
  Filled 2018-11-19: qty 1000

## 2018-11-19 MED ORDER — HEPARIN SOD (PORK) LOCK FLUSH 100 UNIT/ML IV SOLN
500.0000 [IU] | Freq: Once | INTRAVENOUS | Status: AC | PRN
  Administered 2018-11-19: 500 [IU]

## 2018-11-19 NOTE — Progress Notes (Signed)
Patient tolerated IVF without any problems. Patient wheeled to Short Stay Entrance by Peter Congo NT. Patient alert, oriented x3, and in stable condition.

## 2018-11-19 NOTE — Patient Instructions (Signed)
[place] at [facility] Discharge Instructions  Received IV hydration with magnesium and potassium today. Follow-up as scheduled. Call clinic for any questions or cocnerns   Thank you for choosing [place] at [facility] to provide your oncology and hematology care.  To afford each patient quality time with our provider, please arrive at least 15 minutes before your scheduled appointment time.   If you have a lab appointment with the [facility] please come in thru the  Main Entrance and check in at the main information desk  You need to re-schedule your appointment should you arrive 10 or more minutes late.  We strive to give you quality time with our providers, and arriving late affects you and other patients whose appointments are after yours.  Also, if you no show three or more times for appointments you may be dismissed from the clinic at the providers discretion.     Again, thank you for choosing [facility].  Our hope is that these requests will decrease the amount of time that you wait before being seen by our physicians.       _____________________________________________________________  Should you have questions after your visit to [facility], please contact our office at [phone] between the hours of 8:00 a.m. and 4:30 p.m.  Voicemails left after 4:00 p.m. will not be returned until the following business day.  For prescription refill requests, have your pharmacy contact our office and allow 72 hours.    Cancer Center Support Programs:   > Cancer Support Group  2nd Tuesday of the month 1pm-2pm, Journey Room

## 2018-11-22 ENCOUNTER — Other Ambulatory Visit: Payer: Self-pay

## 2018-11-22 ENCOUNTER — Inpatient Hospital Stay (HOSPITAL_COMMUNITY): Payer: Medicare Other

## 2018-11-22 ENCOUNTER — Inpatient Hospital Stay (HOSPITAL_BASED_OUTPATIENT_CLINIC_OR_DEPARTMENT_OTHER): Payer: Medicare Other | Admitting: Nurse Practitioner

## 2018-11-22 ENCOUNTER — Encounter (HOSPITAL_COMMUNITY): Payer: Self-pay | Admitting: *Deleted

## 2018-11-22 VITALS — BP 145/89 | HR 88 | Resp 18

## 2018-11-22 DIAGNOSIS — Z79899 Other long term (current) drug therapy: Secondary | ICD-10-CM

## 2018-11-22 DIAGNOSIS — Z7901 Long term (current) use of anticoagulants: Secondary | ICD-10-CM

## 2018-11-22 DIAGNOSIS — Z86718 Personal history of other venous thrombosis and embolism: Secondary | ICD-10-CM

## 2018-11-22 DIAGNOSIS — G893 Neoplasm related pain (acute) (chronic): Secondary | ICD-10-CM

## 2018-11-22 DIAGNOSIS — Z933 Colostomy status: Secondary | ICD-10-CM | POA: Diagnosis not present

## 2018-11-22 DIAGNOSIS — Z5111 Encounter for antineoplastic chemotherapy: Secondary | ICD-10-CM | POA: Diagnosis not present

## 2018-11-22 DIAGNOSIS — C7889 Secondary malignant neoplasm of other digestive organs: Secondary | ICD-10-CM | POA: Diagnosis not present

## 2018-11-22 DIAGNOSIS — R112 Nausea with vomiting, unspecified: Secondary | ICD-10-CM

## 2018-11-22 DIAGNOSIS — R634 Abnormal weight loss: Secondary | ICD-10-CM

## 2018-11-22 DIAGNOSIS — F419 Anxiety disorder, unspecified: Secondary | ICD-10-CM | POA: Diagnosis not present

## 2018-11-22 DIAGNOSIS — C20 Malignant neoplasm of rectum: Secondary | ICD-10-CM | POA: Diagnosis not present

## 2018-11-22 DIAGNOSIS — J029 Acute pharyngitis, unspecified: Secondary | ICD-10-CM | POA: Diagnosis not present

## 2018-11-22 DIAGNOSIS — Z86711 Personal history of pulmonary embolism: Secondary | ICD-10-CM | POA: Diagnosis not present

## 2018-11-22 DIAGNOSIS — C7801 Secondary malignant neoplasm of right lung: Secondary | ICD-10-CM | POA: Diagnosis not present

## 2018-11-22 DIAGNOSIS — E860 Dehydration: Secondary | ICD-10-CM

## 2018-11-22 MED ORDER — HEPARIN SOD (PORK) LOCK FLUSH 100 UNIT/ML IV SOLN
500.0000 [IU] | Freq: Once | INTRAVENOUS | Status: AC
  Administered 2018-11-22: 500 [IU] via INTRAVENOUS

## 2018-11-22 MED ORDER — SODIUM CHLORIDE 0.9 % IV SOLN
INTRAVENOUS | Status: DC
  Administered 2018-11-22: 12:00:00 via INTRAVENOUS

## 2018-11-22 MED ORDER — SODIUM CHLORIDE 0.9% FLUSH
10.0000 mL | Freq: Once | INTRAVENOUS | Status: AC
  Administered 2018-11-22: 10 mL via INTRAVENOUS

## 2018-11-22 NOTE — Progress Notes (Signed)
Patient's wife called this morning stating that Charles [name] has been extremely weak, inability to eat, feels like things get caught in his throat, his throat is swollen or something is tight in there and he has been hoarse.  I have scheduled him with our NP today.

## 2018-11-22 NOTE — Assessment & Plan Note (Addendum)
1.  Stage IV rectal cancer with lung and pancreatic metastasis: - Foundation 1 CDX shows KRAS G13D and NRAS wild-type, MS-stable. -12 cycles of FOLFOX in the adjuvant setting from October 2012 through 07/14/2011. -15 cycles of FOLFOX and bevacizumab from 07/08/2016 through 01/20/2017 followed by infusional 5-FU and bevacizumab until 07/14/2017. -Maintenance Xeloda started on 08/07/2017 with bevacizumab through 08/19/2018 with progression.  Prior to this he was on 5-FU maintenance. -CT CAP on 07/30/2018 showed no significant progressive metastatic disease.  No significant change in the right upper lobe solid pulmonary nodules.  Stable mixed lytic and sclerotic lower sternal lesion.  Stable malignant pancreatic head mass. - MRCP showed intrahepatic and extrahepatic biliary dilation with stricture at common bile duct.  Mass in the head of the pancreas has increased in size to 2.1 cm, previously 1.7 cm. -ERCP and stent placement on 09/16/2018, showing a single localized biliary structure.  Biliary sphincterectomy was also performed and stricture dilatation to 6 mm with the balloon to facilitate stent placement.  1 covered metal stent was placed. -Flexible sigmoidoscopy on 09/24/2018 showing narrowing of the colorectal anastomosis. -Transverse loop ileostomy was done on 09/29/2018. -Cycle 1 of FOLFIRI on 10/04/2018. -He was admitted to the hospital from 10/16/2018- 10/17/2018 for nausea and dry heaves. He normally empties the bag twice a day however now it is more liquid.  No cramping reported.  He felt tired after cycle 1. - The regimen was dose reduced by 20%.  Neulasta was added to the regimen. -He completed cycle 2 on 10/25/2018.  Continue to hold bevacizumab until 28 days postop.  It is planned to be added to cycle 3. - He is scheduled for his next treatment on 11/24/2018.  He will also see Dr. [name]. - He reports extreme weakness and fatigue ever since starting the chemo. -We will give him 1 L of normal saline  today and he will follow-up in the clinic on Wednesday with Dr. [name].  2.  Weight loss: - He has tried mirtazapine and Marinol which did not work for him. -He started taking Megace twice daily on 11/07/2018.  There is mild improvement in his appetite and eating. -He is drinking 2 ensures per day and also drinking 2 breakfast shakes per day.  He is eating small quantities of solid food. -He has stopped taking the Megace due to it burning his throat.   - He reports he feels like he has phlegm in his throat he cannot get up.  It is causing him to not be able to eat solid foods. -He was told to drink as many cans of the high-calorie Ensure or boost as he can a day.  3.  Peripheral neuropathy: -We will continue gabapentin 900 mg twice daily.  4.  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  Rectal pain: -She will continue taking hydrocodone as needed.  6.  Anxiety: -She is taking Klonopin 1 mg 3 times a day. -This seems to be helping most days.  7.  Sleeping problems: -He is taking trazodone 100 mg at bedtime.  8.  Throat soreness: - He reports he is taking the Dukes mouthwash with nystatin 4 times a day. - He reports he is still having throat soreness. -Upon physical assessment his mouth and throat appeared red with no yeast visible. - He will still continue the mouthwash at this time.

## 2018-11-22 NOTE — Progress Notes (Signed)
To treatment room for hydration.  No s/s of distress noted.  Colostomy bag working with no constipation or diarrhea.  Patient weak and fatigued with decreased appetite.    Patient tolerated hydration with no complaints voiced.  Port site clean and dry with no bruising or swelling noted.  Band aid applied.  VSs with discharge and left by wheelchair with no s/s of distress noted.

## 2018-11-22 NOTE — Patient Instructions (Signed)
[facility] at [facility] Discharge Instructions     Thank you for choosing [facility] at [facility] to provide your oncology and hematology care.  To afford each patient quality time with our provider, please arrive at least 15 minutes before your scheduled appointment time.   If you have a lab appointment with the Cancer Center please come in thru the  Main Entrance and check in at the main information desk  You need to re-schedule your appointment should you arrive 10 or more minutes late.  We strive to give you quality time with our providers, and arriving late affects you and other patients whose appointments are after yours.  Also, if you no show three or more times for appointments you may be dismissed from the clinic at the providers discretion.     Again, thank you for choosing [facility].  Our hope is that these requests will decrease the amount of time that you wait before being seen by our physicians.       _____________________________________________________________  Should you have questions after your visit to  Cancer Center, please contact our office at [phone] between the hours of 8:00 a.m. and 4:30 p.m.  Voicemails left after 4:00 p.m. will not be returned until the following business day.  For prescription refill requests, have your pharmacy contact our office and allow 72 hours.    Cancer Center Support Programs:   > Cancer Support Group  2nd Tuesday of the month 1pm-2pm, Journey Room    

## 2018-11-22 NOTE — Progress Notes (Signed)
[place], [place] [address]   CLINIC:  Medical Oncology/Hematology  PCP:  [name], MD [place] [address] [phone]   REASON FOR VISIT: Follow-up for rectal cancer with mets to the lung and pancreas  CURRENT THERAPY: FOLFIRI on hold  BRIEF ONCOLOGIC HISTORY:  Oncology History  Rectal cancer ([place])  07/24/2010 Initial Diagnosis   Invasive adenocarcinoma of rectum   09/09/2010 Concurrent Chemotherapy   S/P radiation and concurrent 5-FU continuous infusion from 09/09/10- 10/10/10.   11/14/2010 Surgery   S/P proctectomy with colorectal anastomosis and diverting loop ileostomy on 11/14/10 at [facility] by Dr. Harlon Ditty. Pathology reveals a pT3b N1 with 3/20 lymph nodes.   02/05/2011 - 07/14/2011 Chemotherapy   FOLFOX   08/18/2011 Surgery   Approximate date of surgery- [place] by Dr. Harlon Ditty    Remission     11/24/2013 Survivorship   Colonoscopy- somewhat fibrotic/friable anastomotic mucosal-status post biopsy (narrowing not felt to be clinically significant).  Negative pathology for malignancy   09/12/2014 Imaging   DVT in the left femoral venous system, left common iliac vein, IVC, and within the IVC filter Right lower extremity venography confirms chronic occlusion of the femoral venous system with collateralization. The right iliac venous system is patent and do   09/25/2014 PET scan   The right middle lobe pulmonary nodule is hypermetabolic, favored to represent a primary bronchogenic carcinoma.Equivocal mediastinal nodes, similar to surrounding blood pool. Bilateral adrenal hypermetabolism, felt to be physiologic   10/24/2014 Pathology Results   Lung, needle/core biopsy(ies), RML - ADENOCARCINOMA, SEE COMMENT metastatic adenocarcinoma of a colorectal primary   10/24/2014 Relapse/Recurrence     11/16/2014 Definitive Surgery   Bronchoscopy, right video-assisted thoracoscopy, wedge resection of right middle lobe by  Dr. Servando Snare   11/16/2014 Pathology Results   Lung, wedge biopsy/resection, right lingula and small portion of middle lobe - METASTATIC ADENOCARCINOMA, CONSISTENT WITH COLORECTAL PRIMARY, SPANNING 2.0 CM. - THE SURGICAL RESECTION MARGINS ARE NEGATIVE FOR ADENOCARCINOMA.   11/16/2014 Remission   THE SURGICAL RESECTION MARGINS ARE NEGATIVE FOR ADENOCARCINOMA.   03/07/2015 Imaging   CT CAP- Interval resection of right middle lobe metastasis. No acute process or evidence of metastatic disease in the chest, abdomen or pelvis. Improved right upper lobe reticular nodular opacities are favored to represent resolving infection.   09/14/2015 Imaging   CT CAP- No findings of recurrent malignancy. No recurrence along the wedge resection site of the right middle lobe. Right anterior abdominal wall focal hernia containing a knuckle of small bowel without complicating feature.   06/13/2016 Imaging   CT CAP- 1. New right-sided pleural metastasis/mass. Other smaller right-sided pulmonary nodules which are all pleural-based and most likely represent pleural metastasis. 2. No convincing evidence of abdominopelvic nodal metastasis. 3. Constellation of findings, including pancreatic atrophy, duct dilatation, and pancreatic head soft tissue fullness which are highly suspicious for pancreatic adenocarcinoma. Metastatic disease felt much less likely. Consider endoscopic ultrasound sampling or ERCP. Cannot exclude superimposed acute pancreatitis. 4. New enlargement of the appendiceal tip with subtle surrounding edema. Cannot exclude early or mild appendicitis. 5. Coronary artery atherosclerosis. Aortic atherosclerosis. 6. 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   06/13/2016 Progression   CT scan demonstrates progression of disease   06/19/2016 Procedure   EUS with FNA by Dr. Ardis Hughs   06/20/2016 Pathology Results   FINE NEEDLE ASPIRATION, ENDOSCOPIC, PANCREAS UNCINATE AREA(SPECIMEN 1 OF  1 COLLECTED 06/19/16): MALIGNANT CELLS  CONSISTENT WITH METASTATIC ADENOCARCINOMA.   06/25/2016 PET scan   1. Two pleural-based nodules in the right hemithorax are hypermetabolic. Metastatic disease is a distinct consideration. The scattered pulmonary parenchymal nodule seen on previous diagnostic CT imaging are below the threshold for reliable resolution on PET imaging. 2. Hypermetabolic lesion pancreatic head, consistent with neoplasm. As noted on prior CT, adenocarcinoma is any consideration. No evidence for hypermetabolic abdominal lymphadenopathy. Mottled uptake noted in the liver, but no discrete hepatic metastases are evident on PET imaging. 3. Appendix remains distended up to 0.9-10 mm diameter with a stone towards the tip in subtle periappendiceal edema/inflammation. The appendix is hypermetabolic along its length. Imaging features are relatively stable in the 12 day interval since prior CT scan. While appendicitis is a consideration, the relative stability over 12 days would be unusual for that etiology. Continued close follow-up recommended.   06/26/2016 Pathology Results   Not enough tissue for foundationONE or K-ras testing.   07/07/2016 Procedure   Port placed by Dr. Arnoldo Morale   07/08/2016 -  Chemotherapy   The patient had pegfilgrastim (NEULASTA) injection 6 mg, 6 mg, Subcutaneous,  Once, 0 of 4 cycles  ondansetron (ZOFRAN) IVPB 8 mg, 8 mg, Intravenous,  Once, 0 of 4 cycles  leucovorin 804 mg in dextrose 5 % 250 mL infusion, 400 mg/m2, Intravenous,  Once, 0 of 4 cycles  oxaliplatin (ELOXATIN) 170 mg in dextrose 5 % 500 mL chemo infusion, 85 mg/m2, Intravenous,  Once, 0 of 4 cycles  fluorouracil (ADRUCIL) 4,800 mg in sodium chloride 0.9 % 150 mL chemo infusion, 2,400 mg/m2 = 4,800 mg, Intravenous, 1 Day/Dose, 0 of 4 cycles  fluorouracil (ADRUCIL) chemo injection 800 mg, 400 mg/m2, Intravenous,  Once, 0 of 4 cycles  pegfilgrastim (NEULASTA) injection 6 mg, 6 mg,  Subcutaneous,  Once, 2 of 2 cycles  ondansetron (ZOFRAN) IVPB 8 mg, 8 mg, Intravenous,  Once, 12 of 12 cycles  leucovorin 660 mg in dextrose 5 % 250 mL infusion, 660 mg (original dose ), Intravenous,  Once, 12 of 12 cycles Dose modification: 660 mg (Cycle 1)  oxaliplatin (ELOXATIN) 140 mg in dextrose 5 % 500 mL chemo infusion, 140 mg (original dose ), Intravenous,  Once, 12 of 12 cycles Dose modification: 140 mg (Cycle 1), 119 mg (85 % of original dose 140 mg, Cycle 8, Reason: Provider Judgment)  fluorouracil (ADRUCIL) 3,850 mg in sodium chloride 0.9 % 150 mL chemo infusion, 3,850 mg (original dose ), Intravenous, 1 Day/Dose, 12 of 12 cycles Dose modification: 3,850 mg (Cycle 1)  fluorouracil (ADRUCIL) chemo injection 700 mg, 700 mg (original dose ), Intravenous,  Once, 12 of 12 cycles Dose modification: 700 mg (Cycle 1)  palonosetron (ALOXI) injection 0.25 mg, 0.25 mg, Intravenous,  Once, 7 of 12 cycles Administration: 0.25 mg (09/30/2016)  bevacizumab (AVASTIN) 475 mg in sodium chloride 0.9 % 100 mL chemo infusion, 5 mg/kg = 475 mg, Intravenous,  Once, 6 of 11 cycles Administration: 500 mg (09/30/2016)  leucovorin 880 mg in dextrose 5 % 250 mL infusion, 400 mg/m2 = 880 mg, Intravenous,  Once, 7 of 12 cycles Administration: 880 mg (09/30/2016)  oxaliplatin (ELOXATIN) 185 mg in dextrose 5 % 500 mL chemo infusion, 85 mg/m2 = 185 mg, Intravenous,  Once, 7 of 12 cycles Administration: 185 mg (09/30/2016)  fluorouracil (ADRUCIL) chemo injection 900 mg, 400 mg/m2 = 900 mg, Intravenous,  Once, 7 of 12 cycles Administration: 900 mg (09/30/2016)  fluorouracil (ADRUCIL) 5,300 mg in sodium chloride 0.9 % 144 mL  chemo infusion, 2,400 mg/m2 = 5,300 mg, Intravenous, 1 Day/Dose, 7 of 12 cycles Administration: 5,300 mg (09/30/2016)  for chemotherapy treatment.     08/27/2016 Imaging   CT CAP- 1. Pulmonary metastatic disease is stable. 2. Pancreatic head mass, grossly stable, with progressive atrophy  of the body and tail of the pancreas. Stable peripancreatic lymph node. 3. Mild circumferential rectal wall thickening, stable. 4. Aortic atherosclerosis (ICD10-170.0). Coronary artery calcification. 5. Hyperattenuating lesion off the lower pole left kidney, stable, too small to characterize. Continued attention on followup exams is warranted. 6. Persistent wall thickening and mild dilatation of the proximal appendix, of uncertain etiology. Continued attention on followup exams is warranted.   11/06/2016 Imaging   CT C/A/P: IMPRESSION: 1. Stable pulmonary metastatic disease. 2. Similar appearing pancreatic head mass. Progressed atrophy of the pancreatic body and tail. Stable peripancreatic lymph node. 3. Stable hypoattenuating lesion partially exophytic off the inferior pole of the left kidney. 4. Aortic atherosclerosis. 5. Persistent mild wall thickening and mild dilatation of the appendix.    01/30/2017 Imaging   Ct C/A/P: IMPRESSION: 1. Stable pulmonary metastatic disease. 2. Stable to slight increase in size of pancreatic head mass. 3.  Aortic Atherosclerosis (ICD10-I70.0). 4. Similar appearance of mild wall thickening of the appendix which contains an appendicolith.   05/01/2017 Imaging   CT C/A/P: Stable disease in the lungs, no progression of disease in the abdomen.   10/04/2018 -  Chemotherapy   The patient had palonosetron (ALOXI) injection 0.25 mg, 0.25 mg, Intravenous,  Once, 2 of 4 cycles Administration: 0.25 mg (10/04/2018), 0.25 mg (10/25/2018) pegfilgrastim (NEULASTA) injection 6 mg, 6 mg, Subcutaneous, Once, 1 of 3 cycles Administration: 6 mg (10/27/2018) bevacizumab (AVASTIN) 450 mg in sodium chloride 0.9 % 100 mL chemo infusion, 5 mg/kg = 450 mg, Intravenous,  Once, 1 of 3 cycles irinotecan (CAMPTOSAR) 380 mg in sodium chloride 0.9 % 500 mL chemo infusion, 180 mg/m2 = 380 mg, Intravenous,  Once, 2 of 4 cycles Dose modification: 144 mg/m2 (original dose 180 mg/m2,  Cycle 2, Reason: Provider Judgment, Comment: 20% dose reduction) Administration: 380 mg (10/04/2018), 300 mg (10/25/2018) leucovorin 900 mg in sodium chloride 0.9 % 250 mL infusion, 421 mg/m2 = 856 mg, Intravenous,  Once, 2 of 4 cycles Dose modification: 320 mg/m2 (original dose 400 mg/m2, Cycle 2, Reason: Provider Judgment, Comment: 20% dose reduction) Administration: 900 mg (10/04/2018), 700 mg (10/25/2018) fluorouracil (ADRUCIL) chemo injection 850 mg, 400 mg/m2 = 850 mg, Intravenous,  Once, 2 of 4 cycles Dose modification: 320 mg/m2 (original dose 400 mg/m2, Cycle 2, Reason: Provider Judgment, Comment: 20% dose reduction) Administration: 850 mg (10/04/2018), 700 mg (10/25/2018) fosaprepitant (EMEND) 150 mg, dexamethasone (DECADRON) 12 mg in sodium chloride 0.9 % 145 mL IVPB, , Intravenous,  Once, 0 of 2 cycles fluorouracil (ADRUCIL) 5,000 mg in sodium chloride 0.9 % 150 mL chemo infusion, 2,325 mg/m2 = 5,150 mg, Intravenous, 1 Day/Dose, 2 of 4 cycles Dose modification: 1,920 mg/m2 (original dose 2,400 mg/m2, Cycle 2, Reason: Provider Judgment, Comment: 20% dose reduction) Administration: 5,000 mg (10/04/2018), 4,100 mg (10/25/2018)  for chemotherapy treatment.       CANCER STAGING: Cancer Staging Rectal cancer ([place]) Staging form: Colon and Rectum, AJCC 7th Edition - Clinical: Stage IIIB (T3, N1, M0) - Signed by [name], MD on 02/04/2011 - Pathologic stage from 11/17/2014: Stage IVA (M1a) - Signed by Baird Cancer, PA-C on 09/14/2015    INTERVAL HISTORY:  Charles [name] 68 y.o. male returns for routine follow-up  for metastatic rectal cancer.  He reports he is having difficulty swallow due to phlegm and soreness of his throat.  He is using his Dukes mouthwash 4 times a day.  He is also extremely weak and fatigued.  He is unable to eat solid food at this time.  He is drinking Ensure during the day. Denies any nausea, vomiting, or diarrhea. Denies any new pains. Had not noticed any recent  bleeding such as epistaxis, hematuria or hematochezia. Denies recent chest pain on exertion, shortness of breath on minimal exertion, pre-syncopal episodes, or palpitations. Denies any numbness or tingling in hands or feet. Denies any recent fevers, infections, or recent hospitalizations. Patient reports appetite at 0% and energy level at 0%.  Has lost 2 more pounds since his last visit.  He has lost a total of 7 pounds over the last 3 weeks.    REVIEW OF SYSTEMS:  Review of Systems  Constitutional: Positive for fatigue.  HENT:   Positive for sore throat and trouble swallowing.   Respiratory: Positive for shortness of breath.   Neurological: Positive for dizziness and extremity weakness.  Psychiatric/Behavioral: Positive for sleep disturbance.  All other systems reviewed and are negative.    PAST MEDICAL/SURGICAL HISTORY:  Past Medical History:  Diagnosis Date   Chronic anticoagulation    Colon cancer ([place]) 07/24/2010   rectal ca, inv adenocarcinoma   Depression 04/01/2011   Diabetes mellitus without complication ([place])    DVT (deep venous thrombosis) ([place]) 05/09/2011   GERD (gastroesophageal reflux disease)    High output ileostomy ([place]) 05/21/2011   History of kidney stones    HTN (hypertension)    Hx of radiation therapy 09/02/10 to 10/14/10   pelvis   Lung metastasis ([facility])    Neuropathy    Peripheral vascular disease ([place])    dvt's,pe   Pneumonia    hx x3   Pulmonary embolism ([place])    Rectal cancer ([place]) 01/15/2011   S/P radiation and concurrent 5-FU continuous infusion from 09/09/10- 10/10/10.  S/P proctectomy with colorectal anastomosis and diverting loop ileostomy on 11/14/10 at [facility] by Dr. Harlon Ditty. Pathology reveals a pT3b N1 with 3/20 lymph nodes.     Past Surgical History:  Procedure Laterality Date   BILIARY STENT PLACEMENT N/A 09/16/2018   Procedure: STENT PLACEMENT;  Surgeon: Rogene Houston, MD;  [facility];  Service: Endoscopy;   Laterality: N/A;   BIOPSY  09/24/2018   Procedure: BIOPSY;  Surgeon: Danie Binder, MD;  [facility];  Service: Endoscopy;;   COLON SURGERY  11/14/2010   proctectomy with colorectal anastomosis and diverting loop ileostomy (temporary planned)   COLONOSCOPY  07/2010   proximal rectal apple core mass 10-14cm from anal verge (adenocarcinoma), 2-3cm distal rectal carpet polyp s/p piecemeal snare polypectomy (adenoma)   COLONOSCOPY  04/21/2012   RMR: Friable,fibrotic appearing colorectal anastomosis producing some luminal narrowing-not felt to be critical. path: focal erosion with slight inflammation and hyperemia. SURVEILLANCE DUE DEC 2015   COLONOSCOPY N/A 11/24/2013   Dr. Rourk:somewhat fibrotic/friable anastomotic mucosal-status post biopsy (narrowing not felt to be clinically significant) Single colonic diverticulum. benign polypoid rectal mucosa   colostomy reversal  april 2013   ERCP N/A 09/16/2018   Procedure: ENDOSCOPIC RETROGRADE CHOLANGIOPANCREATOGRAPHY (ERCP);  Surgeon: Rogene Houston, MD;  [facility];  Service: Endoscopy;  Laterality: N/A;   ESOPHAGOGASTRODUODENOSCOPY  07/2010   RMR: schatki ring s/p dilation, small hh, SB bx benign   ESOPHAGOGASTRODUODENOSCOPY N/A 09/04/2015  Procedure: ESOPHAGOGASTRODUODENOSCOPY (EGD);  Surgeon: Daneil Dolin, MD;  [facility];  Service: Endoscopy;  Laterality: N/A;  730   EUS  08/2010   Dr. Owens Loffler. uT3N0 circumferential, nearly obstruction rectosigmoid adenocarcinoma, distal edge 12cm from anal verge   EUS N/A 06/19/2016   Procedure: UPPER ENDOSCOPIC ULTRASOUND (EUS) RADIAL;  Surgeon: Milus Banister, MD;  Location: [facility];  Service: Endoscopy;  Laterality: N/A;   FLEXIBLE SIGMOIDOSCOPY N/A 09/24/2018   Procedure: FLEXIBLE SIGMOIDOSCOPY;  Surgeon: Danie Binder, MD;  [facility];  Service: Endoscopy;  Laterality: N/A;   HERNIA REPAIR     abd hernia repair   IVC filter      ivc filter     port a cath placement     PORT-A-CATH REMOVAL  09/24/2011   Procedure: REMOVAL PORT-A-CATH;  Surgeon: Donato Heinz, MD;  Location: [facility];  Service: General;  Laterality: N/A;  Minor Room   PORTACATH PLACEMENT Right 07/07/2016   Procedure: INSERTION PORT-A-CATH;  Surgeon: Aviva Signs, MD;  Location: [facility];  Service: General;  Laterality: Right;   SPHINCTEROTOMY N/A 09/16/2018   Procedure: SPHINCTEROTOMY with balloon dialation;  Surgeon: Rogene Houston, MD;  [facility];  Service: Endoscopy;  Laterality: N/A;   TRANSVERSE LOOP COLOSTOMY N/A 09/29/2018   Procedure: TRANSVERSE LOOP COLOSTOMY;  Surgeon: Aviva Signs, MD;  Location: [facility];  Service: General;  Laterality: N/A;   VIDEO ASSISTED THORACOSCOPY (VATS)/WEDGE RESECTION Right 11/15/2014   Procedure: VIDEO ASSISTED THORACOSCOPY (VATS)/LUNG RESECTION WITH RIGHT LINGULECTOMY;  Surgeon: [name], MD;  Location: [place];  Service: Thoracic;  Laterality: Right;   VIDEO BRONCHOSCOPY N/A 11/15/2014   Procedure: VIDEO BRONCHOSCOPY;  Surgeon: [name], MD;  Location: [facility];  Service: Thoracic;  Laterality: N/A;     SOCIAL HISTORY:  Social History   Socioeconomic History   Marital status: Married    Spouse name: Not on file   Number of children: 2   Years of education: Not on file   Highest education level: Not on file  Occupational History   Occupation: self-employed Regulatory affairs officer, currently not working    Fish farm manager: [place] resource strain: Not very hard   Food insecurity    Worry: Never true    Inability: Never true   Transportation needs    Medical: No    Non-medical: No  Tobacco Use   Smoking status: Never Smoker   Smokeless tobacco: Never Used  Substance and Sexual Activity   Alcohol use: No   Drug use: No   Sexual activity: Yes    Birth control/protection: None    Comment: married  Lifestyle   Physical activity     Days per week: 0 days    Minutes per session: 0 min   Stress: Only a little  Relationships   Social connections    Talks on phone: More than three times a week    Gets together: Once a week    Attends religious service: Patient refused    Active member of club or organization: Patient refused    Attends meetings of clubs or organizations: Patient refused    Relationship status: Patient refused   Intimate partner violence    Fear of current or ex partner: Patient refused    Emotionally abused: Patient refused    Physically abused: Patient refused    Forced sexual activity: Patient refused  Other Topics Concern   Not on  file  Social History Narrative   Not on file    FAMILY HISTORY:  Family History  Problem Relation Age of Onset   Cancer Brother        throat   Cancer Brother        prostate   Colon cancer Neg Hx    Liver disease Neg Hx    Inflammatory bowel disease Neg Hx     CURRENT MEDICATIONS:  Outpatient Encounter Medications as of 11/22/2018  Medication Sig   acetaminophen (TYLENOL) 500 MG tablet Take 1,000 mg by mouth every 6 (six) hours as needed for mild pain or moderate pain.    clonazePAM (KLONOPIN) 1 MG tablet Take 1 tablet (1 mg total) by mouth 3 (three) times daily as needed.   Diphenhyd-Hydrocort-Nystatin (FIRST-DUKES MOUTHWASH) SUSP Use as directed 5 mLs in the mouth or throat 4 (four) times daily.   gabapentin (NEURONTIN) 300 MG capsule Take 3 capsules (900 mg total) by mouth 2 (two) times daily.   glimepiride (AMARYL) 2 MG tablet Take 2 mg by mouth daily with breakfast.    mirtazapine (REMERON) 7.5 MG tablet    omeprazole (PRILOSEC) 20 MG capsule Take 20 mg by mouth 2 (two) times daily before a meal.    ondansetron (ZOFRAN) 8 MG tablet Take 1 tablet (8 mg total) by mouth 2 (two) times daily as needed for refractory nausea / vomiting. Start on day 3 after chemotherapy.   Probiotic Product (PROBIOTIC DAILY PO) Take 1 capsule by mouth  daily.   prochlorperazine (COMPAZINE) 10 MG tablet Take 1 tablet (10 mg total) by mouth every 6 (six) hours as needed for nausea or vomiting.   promethazine (PHENERGAN) 25 MG tablet Take 1 tablet (25 mg total) by mouth every 8 (eight) hours as needed for refractory nausea / vomiting.   rivaroxaban (XARELTO) 20 MG TABS tablet Take 1 tablet (20 mg total) by mouth daily with supper.   traZODone (DESYREL) 100 MG tablet Take 1 tablet (100 mg total) by mouth at bedtime.   vitamin B-12 (CYANOCOBALAMIN) 1000 MCG tablet Take 1,000 mcg by mouth daily.   diphenoxylate-atropine (LOMOTIL) 2.5-0.025 MG tablet Take 1 tablet by mouth 4 (four) times daily as needed for diarrhea or loose stools. (Patient not taking: Reported on 11/16/2018)   dronabinol (MARINOL) 5 MG capsule Take 1 capsule (5 mg total) by mouth 2 (two) times daily before a meal. (Patient not taking: Reported on 11/22/2018)   megestrol (MEGACE) 400 MG/10ML suspension Take 10 mLs (400 mg total) by mouth 2 (two) times daily. (Patient not taking: Reported on 11/22/2018)   oxyCODONE-acetaminophen (PERCOCET) 10-325 MG tablet Take 1 tablet by mouth every 6 (six) hours as needed for pain. (Patient not taking: Reported on 11/22/2018)   Facility-Administered Encounter Medications as of 11/22/2018  Medication   sodium chloride 0.9 % injection 10 mL   sodium chloride flush (NS) 0.9 % injection 10 mL    ALLERGIES:  Allergies  Allergen Reactions   Oxycodone     Blisters, hallucinations   Tramadol     Blisters, hallucinations   Trazodone And Nefazodone Other (See Comments)    hallucinations     PHYSICAL EXAM:  ECOG Performance status: 2  Vitals:   11/22/18 1048  BP: (!) 143/93  Pulse: (!) 112  Resp: 18  Temp: 98.2 F (36.8 C)  SpO2: 99%   Filed Weights   11/22/18 1048  Weight: 165 lb (74.8 kg)    Physical Exam Constitutional:  Appearance: He is ill-appearing.  Cardiovascular:     Rate and Rhythm: Regular rhythm.  Tachycardia present.     Heart sounds: Normal heart sounds.  Pulmonary:     Effort: Pulmonary effort is normal.     Breath sounds: Normal breath sounds.  Abdominal:     General: Bowel sounds are normal.     Palpations: Abdomen is soft.  Musculoskeletal: Normal range of motion.  Skin:    General: Skin is warm and dry.     Coloration: Skin is pale.  Neurological:     Mental Status: He is alert and oriented to person, place, and time. Mental status is at baseline.  Psychiatric:        Mood and Affect: Mood normal.        Behavior: Behavior normal.        Thought Content: Thought content normal.        Judgment: Judgment normal.      LABORATORY DATA:  I have reviewed the labs as listed.  CBC    Component Value Date/Time   WBC 19.5 (H) 11/16/2018 0800   RBC 4.38 11/16/2018 0800   HGB 13.5 11/16/2018 0800   HGB 11.6 (L) 02/04/2011 1059   HCT 40.9 11/16/2018 0800   HCT 34.7 (L) 02/04/2011 1059   PLT 282 11/16/2018 0800   PLT 282 02/04/2011 1059   MCV 93.4 11/16/2018 0800   MCV 76.0 (L) 02/04/2011 1059   MCH 30.8 11/16/2018 0800   MCHC 33.0 11/16/2018 0800   RDW 15.0 11/16/2018 0800   RDW 18.5 (H) 02/04/2011 1059   LYMPHSABS 2.3 11/16/2018 0800   LYMPHSABS 1.1 02/04/2011 1059   MONOABS 1.5 (H) 11/16/2018 0800   MONOABS 0.5 02/04/2011 1059   EOSABS 0.2 11/16/2018 0800   EOSABS 0.1 02/04/2011 1059   BASOSABS 0.1 11/16/2018 0800   BASOSABS 0.1 02/04/2011 1059   CMP Latest Ref Rng & Units 11/16/2018 11/08/2018 11/02/2018  Glucose 70 - 99 mg/dL 209(H) 132(H) 181(H)  BUN 8 - 23 mg/dL _0 Creatinine 0.61 - 1.24 mg/dL 0.70 0.61 0.83  Sodium 135 - 145 mmol/L 135 139 136  Potassium 3.5 - 5.1 mmol/L 4.3 3.5 4.6  Chloride 98 - 111 mmol/L 107 107 100  CO2 22 - 32 mmol/L 20(L) 25 26  Calcium 8.9 - 10.3 mg/dL 9.0 8.9 9.1  Total Protein 6.5 - 8.1 g/dL 6.6 6.2(L) 6.7  Total Bilirubin 0.3 - 1.2 mg/dL 1.0 0.9 1.8(H)  Alkaline Phos 38 - 126 U/L 111 104 117  AST 15 - 41 U/L _1 ALT 0 - 44 U/L _2 I personally performed a face-to-face visit.  All questions were answered to patient's stated satisfaction. Encouraged patient to call with any new concerns or questions before his next visit to the cancer center and we can certain see him sooner, if needed.     ASSESSMENT & PLAN:   Rectal cancer 1.  Stage IV rectal cancer with lung and pancreatic metastasis: - Foundation 1 CDX shows KRAS G13D and NRAS wild-type, MS-stable. -12 cycles of FOLFOX in the adjuvant setting from October 2012 through 07/14/2011. -15 cycles of FOLFOX and bevacizumab from 07/08/2016 through 01/20/2017 followed by infusional 5-FU and bevacizumab until 07/14/2017. -Maintenance Xeloda started on 08/07/2017 with bevacizumab through 08/19/2018 with progression.  Prior to this he was on 5-FU maintenance. -CT CAP on 07/30/2018 showed no significant progressive metastatic disease.  No significant change in the right upper lobe  solid pulmonary nodules.  Stable mixed lytic and sclerotic lower sternal lesion.  Stable malignant pancreatic head mass. - MRCP showed intrahepatic and extrahepatic biliary dilation with stricture at common bile duct.  Mass in the head of the pancreas has increased in size to 2.1 cm, previously 1.7 cm. -ERCP and stent placement on 09/16/2018, showing a single localized biliary structure.  Biliary sphincterectomy was also performed and stricture dilatation to 6 mm with the balloon to facilitate stent placement.  1 covered metal stent was placed. -Flexible sigmoidoscopy on 09/24/2018 showing narrowing of the colorectal anastomosis. -Transverse loop ileostomy was done on 09/29/2018. -Cycle 1 of FOLFIRI on 10/04/2018. -He was admitted to the hospital from 10/16/2018- 10/17/2018 for nausea and dry heaves. He normally empties the bag twice a day however now it is more liquid.  No cramping reported.  He felt tired after cycle 1. - The regimen was dose reduced by 20%.  Neulasta was added to the  regimen. -He completed cycle 2 on 10/25/2018.  Continue to hold bevacizumab until 28 days postop.  It is planned to be added to cycle 3. - He is scheduled for his next treatment on 11/24/2018.  He will also see Dr. [name]. - He reports extreme weakness and fatigue ever since starting the chemo. -We will give him 1 L of normal saline today and he will follow-up in the clinic on Wednesday with Dr. [name].  2.  Weight loss: - He has tried mirtazapine and Marinol which did not work for him. -He started taking Megace twice daily on 11/07/2018.  There is mild improvement in his appetite and eating. -He is drinking 2 ensures per day and also drinking 2 breakfast shakes per day.  He is eating small quantities of solid food. -He has stopped taking the Megace due to it burning his throat.   - He reports he feels like he has phlegm in his throat he cannot get up.  It is causing him to not be able to eat solid foods. -He was told to drink as many cans of the high-calorie Ensure or boost as he can a day.  3.  Peripheral neuropathy: -We will continue gabapentin 900 mg twice daily.  4.  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  Rectal pain: -She will continue taking hydrocodone as needed.  6.  Anxiety: -She is taking Klonopin 1 mg 3 times a day. -This seems to be helping most days.  7.  Sleeping problems: -He is taking trazodone 100 mg at bedtime.  8.  Throat soreness: - He reports he is taking the Dukes mouthwash with nystatin 4 times a day. - He reports he is still having throat soreness. -Upon physical assessment his mouth and throat appeared red with no yeast visible. - He will still continue the mouthwash at this time.      Orders placed this encounter:  No orders of the defined types were placed in this encounter.     Charles Finders, FNP-C [place] [phone]

## 2018-11-23 DIAGNOSIS — C799 Secondary malignant neoplasm of unspecified site: Secondary | ICD-10-CM | POA: Diagnosis not present

## 2018-11-23 DIAGNOSIS — Z299 Encounter for prophylactic measures, unspecified: Secondary | ICD-10-CM | POA: Diagnosis not present

## 2018-11-23 DIAGNOSIS — I82409 Acute embolism and thrombosis of unspecified deep veins of unspecified lower extremity: Secondary | ICD-10-CM | POA: Diagnosis not present

## 2018-11-23 DIAGNOSIS — C189 Malignant neoplasm of colon, unspecified: Secondary | ICD-10-CM | POA: Diagnosis not present

## 2018-11-23 DIAGNOSIS — Z789 Other specified health status: Secondary | ICD-10-CM | POA: Diagnosis not present

## 2018-11-23 DIAGNOSIS — R1310 Dysphagia, unspecified: Secondary | ICD-10-CM | POA: Diagnosis not present

## 2018-11-24 ENCOUNTER — Other Ambulatory Visit: Payer: Self-pay

## 2018-11-24 ENCOUNTER — Inpatient Hospital Stay (HOSPITAL_BASED_OUTPATIENT_CLINIC_OR_DEPARTMENT_OTHER): Payer: Medicare Other | Admitting: Hematology

## 2018-11-24 ENCOUNTER — Inpatient Hospital Stay (HOSPITAL_COMMUNITY): Payer: Medicare Other

## 2018-11-24 ENCOUNTER — Encounter (HOSPITAL_COMMUNITY): Payer: Self-pay | Admitting: Hematology

## 2018-11-24 VITALS — BP 132/83 | HR 91 | Temp 97.5°F | Resp 18 | Wt 166.4 lb

## 2018-11-24 DIAGNOSIS — R634 Abnormal weight loss: Secondary | ICD-10-CM | POA: Diagnosis not present

## 2018-11-24 DIAGNOSIS — Z86718 Personal history of other venous thrombosis and embolism: Secondary | ICD-10-CM

## 2018-11-24 DIAGNOSIS — Z86711 Personal history of pulmonary embolism: Secondary | ICD-10-CM | POA: Diagnosis not present

## 2018-11-24 DIAGNOSIS — R112 Nausea with vomiting, unspecified: Secondary | ICD-10-CM

## 2018-11-24 DIAGNOSIS — G893 Neoplasm related pain (acute) (chronic): Secondary | ICD-10-CM

## 2018-11-24 DIAGNOSIS — Z933 Colostomy status: Secondary | ICD-10-CM | POA: Diagnosis not present

## 2018-11-24 DIAGNOSIS — C20 Malignant neoplasm of rectum: Secondary | ICD-10-CM

## 2018-11-24 DIAGNOSIS — T451X5S Adverse effect of antineoplastic and immunosuppressive drugs, sequela: Secondary | ICD-10-CM | POA: Diagnosis not present

## 2018-11-24 DIAGNOSIS — F419 Anxiety disorder, unspecified: Secondary | ICD-10-CM | POA: Diagnosis not present

## 2018-11-24 DIAGNOSIS — J029 Acute pharyngitis, unspecified: Secondary | ICD-10-CM | POA: Diagnosis not present

## 2018-11-24 DIAGNOSIS — C250 Malignant neoplasm of head of pancreas: Secondary | ICD-10-CM | POA: Diagnosis not present

## 2018-11-24 DIAGNOSIS — C7889 Secondary malignant neoplasm of other digestive organs: Secondary | ICD-10-CM

## 2018-11-24 DIAGNOSIS — K9189 Other postprocedural complications and disorders of digestive system: Secondary | ICD-10-CM | POA: Diagnosis not present

## 2018-11-24 DIAGNOSIS — G620 Drug-induced polyneuropathy: Secondary | ICD-10-CM | POA: Diagnosis not present

## 2018-11-24 DIAGNOSIS — Z79899 Other long term (current) drug therapy: Secondary | ICD-10-CM | POA: Diagnosis not present

## 2018-11-24 DIAGNOSIS — C7801 Secondary malignant neoplasm of right lung: Secondary | ICD-10-CM | POA: Diagnosis not present

## 2018-11-24 DIAGNOSIS — Z5111 Encounter for antineoplastic chemotherapy: Secondary | ICD-10-CM | POA: Diagnosis not present

## 2018-11-24 DIAGNOSIS — Z433 Encounter for attention to colostomy: Secondary | ICD-10-CM | POA: Diagnosis not present

## 2018-11-24 DIAGNOSIS — Z7901 Long term (current) use of anticoagulants: Secondary | ICD-10-CM | POA: Diagnosis not present

## 2018-11-24 DIAGNOSIS — E1151 Type 2 diabetes mellitus with diabetic peripheral angiopathy without gangrene: Secondary | ICD-10-CM | POA: Diagnosis not present

## 2018-11-24 DIAGNOSIS — E860 Dehydration: Secondary | ICD-10-CM

## 2018-11-24 LAB — COMPREHENSIVE METABOLIC PANEL
ALT: 25 U/L (ref 0–44)
AST: 21 U/L (ref 15–41)
Albumin: 3.3 g/dL — ABNORMAL LOW (ref 3.5–5.0)
Alkaline Phosphatase: 105 U/L (ref 38–126)
Anion gap: 10 (ref 5–15)
BUN: 18 mg/dL (ref 8–23)
CO2: 23 mmol/L (ref 22–32)
Calcium: 9.1 mg/dL (ref 8.9–10.3)
Chloride: 103 mmol/L (ref 98–111)
Creatinine, Ser: 0.73 mg/dL (ref 0.61–1.24)
GFR calc Af Amer: >60 mL/min (ref >60)
GFR calc non Af Amer: >60 mL/min (ref >60)
Glucose, Bld: 265 mg/dL — ABNORMAL HIGH (ref 70–99)
Potassium: 3.9 mmol/L (ref 3.5–5.1)
Sodium: 136 mmol/L (ref 135–145)
Total Bilirubin: 1.3 mg/dL — ABNORMAL HIGH (ref 0.3–1.2)
Total Protein: 6.5 g/dL (ref 6.5–8.1)

## 2018-11-24 LAB — CBC WITH DIFFERENTIAL/PLATELET
Abs Immature Granulocytes: 0.05 10*3/uL (ref 0.00–0.07)
Basophils Absolute: 0.1 10*3/uL (ref 0.0–0.1)
Basophils Relative: 0 %
Eosinophils Absolute: 0.2 10*3/uL (ref 0.0–0.5)
Eosinophils Relative: 2 %
HCT: 40.3 % (ref 39.0–52.0)
Hemoglobin: 13.1 g/dL (ref 13.0–17.0)
Immature Granulocytes: 0 %
Lymphocytes Relative: 13 %
Lymphs Abs: 1.8 10*3/uL (ref 0.7–4.0)
MCH: 30.1 pg (ref 26.0–34.0)
MCHC: 32.5 g/dL (ref 30.0–36.0)
MCV: 92.6 fL (ref 80.0–100.0)
Monocytes Absolute: 0.8 10*3/uL (ref 0.1–1.0)
Monocytes Relative: 6 %
Neutro Abs: 10.8 10*3/uL — ABNORMAL HIGH (ref 1.7–7.7)
Neutrophils Relative %: 79 %
Platelets: 304 10*3/uL (ref 150–400)
RBC: 4.35 MIL/uL (ref 4.22–5.81)
RDW: 14.5 % (ref 11.5–15.5)
WBC: 13.7 10*3/uL — ABNORMAL HIGH (ref 4.0–10.5)
nRBC: 0 % (ref 0.0–0.2)

## 2018-11-24 MED ORDER — SODIUM CHLORIDE 0.9% FLUSH
10.0000 mL | Freq: Once | INTRAVENOUS | Status: AC | PRN
  Administered 2018-11-24: 10 mL

## 2018-11-24 MED ORDER — HEPARIN SOD (PORK) LOCK FLUSH 100 UNIT/ML IV SOLN
500.0000 [IU] | Freq: Once | INTRAVENOUS | Status: AC | PRN
  Administered 2018-11-24: 500 [IU]

## 2018-11-24 MED ORDER — SODIUM CHLORIDE 0.9 % IV SOLN
Freq: Once | INTRAVENOUS | Status: AC
  Administered 2018-11-24: 10:00:00 via INTRAVENOUS
  Filled 2018-11-24: qty 1000

## 2018-11-24 NOTE — Patient Instructions (Signed)
[facility] at [facility] Discharge Instructions  You were seen today by Dr. Katragadda. He went over your recent lab results. He will see you back in 1 week for labs and follow up.   Thank you for choosing [facility] at [facility] to provide your oncology and hematology care.  To afford each patient quality time with our provider, please arrive at least 15 minutes before your scheduled appointment time.   If you have a lab appointment with the Cancer Center please come in thru the  Main Entrance and check in at the main information desk  You need to re-schedule your appointment should you arrive 10 or more minutes late.  We strive to give you quality time with our providers, and arriving late affects you and other patients whose appointments are after yours.  Also, if you no show three or more times for appointments you may be dismissed from the clinic at the providers discretion.     Again, thank you for choosing [facility].  Our hope is that these requests will decrease the amount of time that you wait before being seen by our physicians.       _____________________________________________________________  Should you have questions after your visit to [facility], please contact our office at [phone] between the hours of 8:00 a.m. and 4:30 p.m.  Voicemails left after 4:00 p.m. will not be returned until the following business day.  For prescription refill requests, have your pharmacy contact our office and allow 72 hours.    Cancer Center Support Programs:   > Cancer Support Group  2nd Tuesday of the month 1pm-2pm, Journey Room    

## 2018-11-24 NOTE — Progress Notes (Signed)
[place], [place] [address]   CLINIC:  Medical Oncology/Hematology  PCP:  [name], MD [facility] [address] [phone]   REASON FOR VISIT:  Follow-up for Stage IV rectal cancer  CURRENT THERAPY: FOLFIRI  BRIEF ONCOLOGIC HISTORY:  Oncology History  Rectal cancer ([place])  07/24/2010 Initial Diagnosis   Invasive adenocarcinoma of rectum   09/09/2010 Concurrent Chemotherapy   S/P radiation and concurrent 5-FU continuous infusion from 09/09/10- 10/10/10.   11/14/2010 Surgery   S/P proctectomy with colorectal anastomosis and diverting loop ileostomy on 11/14/10 at [facility] by Dr. Harlon Ditty. Pathology reveals a pT3b N1 with 3/20 lymph nodes.   02/05/2011 - 07/14/2011 Chemotherapy   FOLFOX   08/18/2011 Surgery   Approximate date of surgery- [place] by Dr. Harlon Ditty    Remission     11/24/2013 Survivorship   Colonoscopy- somewhat fibrotic/friable anastomotic mucosal-status post biopsy (narrowing not felt to be clinically significant).  Negative pathology for malignancy   09/12/2014 Imaging   DVT in the left femoral venous system, left common iliac vein, IVC, and within the IVC filter Right lower extremity venography confirms chronic occlusion of the femoral venous system with collateralization. The right iliac venous system is patent and do   09/25/2014 PET scan   The right middle lobe pulmonary nodule is hypermetabolic, favored to represent a primary bronchogenic carcinoma.Equivocal mediastinal nodes, similar to surrounding blood pool. Bilateral adrenal hypermetabolism, felt to be physiologic   10/24/2014 Pathology Results   Lung, needle/core biopsy(ies), RML - ADENOCARCINOMA, SEE COMMENT metastatic adenocarcinoma of a colorectal primary   10/24/2014 Relapse/Recurrence     11/16/2014 Definitive Surgery   Bronchoscopy, right video-assisted thoracoscopy, wedge resection of right middle lobe by Dr. Servando Snare   11/16/2014  Pathology Results   Lung, wedge biopsy/resection, right lingula and small portion of middle lobe - METASTATIC ADENOCARCINOMA, CONSISTENT WITH COLORECTAL PRIMARY, SPANNING 2.0 CM. - THE SURGICAL RESECTION MARGINS ARE NEGATIVE FOR ADENOCARCINOMA.   11/16/2014 Remission   THE SURGICAL RESECTION MARGINS ARE NEGATIVE FOR ADENOCARCINOMA.   03/07/2015 Imaging   CT CAP- Interval resection of right middle lobe metastasis. No acute process or evidence of metastatic disease in the chest, abdomen or pelvis. Improved right upper lobe reticular nodular opacities are favored to represent resolving infection.   09/14/2015 Imaging   CT CAP- No findings of recurrent malignancy. No recurrence along the wedge resection site of the right middle lobe. Right anterior abdominal wall focal hernia containing a knuckle of small bowel without complicating feature.   06/13/2016 Imaging   CT CAP- 1. New right-sided pleural metastasis/mass. Other smaller right-sided pulmonary nodules which are all pleural-based and most likely represent pleural metastasis. 2. No convincing evidence of abdominopelvic nodal metastasis. 3. Constellation of findings, including pancreatic atrophy, duct dilatation, and pancreatic head soft tissue fullness which are highly suspicious for pancreatic adenocarcinoma. Metastatic disease felt much less likely. Consider endoscopic ultrasound sampling or ERCP. Cannot exclude superimposed acute pancreatitis. 4. New enlargement of the appendiceal tip with subtle surrounding edema. Cannot exclude early or mild appendicitis. 5. Coronary artery atherosclerosis. Aortic atherosclerosis. 6. 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   06/13/2016 Progression   CT scan demonstrates progression of disease   06/19/2016 Procedure   EUS with FNA by Dr. Ardis Hughs   06/20/2016 Pathology Results   FINE NEEDLE ASPIRATION, ENDOSCOPIC, PANCREAS UNCINATE AREA(SPECIMEN 1 OF 1 COLLECTED 06/19/16):  MALIGNANT CELLS CONSISTENT WITH METASTATIC ADENOCARCINOMA.  06/25/2016 PET scan   1. Two pleural-based nodules in the right hemithorax are hypermetabolic. Metastatic disease is a distinct consideration. The scattered pulmonary parenchymal nodule seen on previous diagnostic CT imaging are below the threshold for reliable resolution on PET imaging. 2. Hypermetabolic lesion pancreatic head, consistent with neoplasm. As noted on prior CT, adenocarcinoma is any consideration. No evidence for hypermetabolic abdominal lymphadenopathy. Mottled uptake noted in the liver, but no discrete hepatic metastases are evident on PET imaging. 3. Appendix remains distended up to 0.9-10 mm diameter with a stone towards the tip in subtle periappendiceal edema/inflammation. The appendix is hypermetabolic along its length. Imaging features are relatively stable in the 12 day interval since prior CT scan. While appendicitis is a consideration, the relative stability over 12 days would be unusual for that etiology. Continued close follow-up recommended.   06/26/2016 Pathology Results   Not enough tissue for foundationONE or K-ras testing.   07/07/2016 Procedure   Port placed by Dr. Arnoldo Morale   07/08/2016 -  Chemotherapy   The patient had pegfilgrastim (NEULASTA) injection 6 mg, 6 mg, Subcutaneous,  Once, 0 of 4 cycles  ondansetron (ZOFRAN) IVPB 8 mg, 8 mg, Intravenous,  Once, 0 of 4 cycles  leucovorin 804 mg in dextrose 5 % 250 mL infusion, 400 mg/m2, Intravenous,  Once, 0 of 4 cycles  oxaliplatin (ELOXATIN) 170 mg in dextrose 5 % 500 mL chemo infusion, 85 mg/m2, Intravenous,  Once, 0 of 4 cycles  fluorouracil (ADRUCIL) 4,800 mg in sodium chloride 0.9 % 150 mL chemo infusion, 2,400 mg/m2 = 4,800 mg, Intravenous, 1 Day/Dose, 0 of 4 cycles  fluorouracil (ADRUCIL) chemo injection 800 mg, 400 mg/m2, Intravenous,  Once, 0 of 4 cycles  pegfilgrastim (NEULASTA) injection 6 mg, 6 mg, Subcutaneous,  Once, 2 of 2  cycles  ondansetron (ZOFRAN) IVPB 8 mg, 8 mg, Intravenous,  Once, 12 of 12 cycles  leucovorin 660 mg in dextrose 5 % 250 mL infusion, 660 mg (original dose ), Intravenous,  Once, 12 of 12 cycles Dose modification: 660 mg (Cycle 1)  oxaliplatin (ELOXATIN) 140 mg in dextrose 5 % 500 mL chemo infusion, 140 mg (original dose ), Intravenous,  Once, 12 of 12 cycles Dose modification: 140 mg (Cycle 1), 119 mg (85 % of original dose 140 mg, Cycle 8, Reason: Provider Judgment)  fluorouracil (ADRUCIL) 3,850 mg in sodium chloride 0.9 % 150 mL chemo infusion, 3,850 mg (original dose ), Intravenous, 1 Day/Dose, 12 of 12 cycles Dose modification: 3,850 mg (Cycle 1)  fluorouracil (ADRUCIL) chemo injection 700 mg, 700 mg (original dose ), Intravenous,  Once, 12 of 12 cycles Dose modification: 700 mg (Cycle 1)  palonosetron (ALOXI) injection 0.25 mg, 0.25 mg, Intravenous,  Once, 7 of 12 cycles Administration: 0.25 mg (09/30/2016)  bevacizumab (AVASTIN) 475 mg in sodium chloride 0.9 % 100 mL chemo infusion, 5 mg/kg = 475 mg, Intravenous,  Once, 6 of 11 cycles Administration: 500 mg (09/30/2016)  leucovorin 880 mg in dextrose 5 % 250 mL infusion, 400 mg/m2 = 880 mg, Intravenous,  Once, 7 of 12 cycles Administration: 880 mg (09/30/2016)  oxaliplatin (ELOXATIN) 185 mg in dextrose 5 % 500 mL chemo infusion, 85 mg/m2 = 185 mg, Intravenous,  Once, 7 of 12 cycles Administration: 185 mg (09/30/2016)  fluorouracil (ADRUCIL) chemo injection 900 mg, 400 mg/m2 = 900 mg, Intravenous,  Once, 7 of 12 cycles Administration: 900 mg (09/30/2016)  fluorouracil (ADRUCIL) 5,300 mg in sodium chloride 0.9 % 144 mL chemo infusion, 2,400 mg/m2 = 5,300  mg, Intravenous, 1 Day/Dose, 7 of 12 cycles Administration: 5,300 mg (09/30/2016)  for chemotherapy treatment.     08/27/2016 Imaging   CT CAP- 1. Pulmonary metastatic disease is stable. 2. Pancreatic head mass, grossly stable, with progressive atrophy of the body and tail of the  pancreas. Stable peripancreatic lymph node. 3. Mild circumferential rectal wall thickening, stable. 4. Aortic atherosclerosis (ICD10-170.0). Coronary artery calcification. 5. Hyperattenuating lesion off the lower pole left kidney, stable, too small to characterize. Continued attention on followup exams is warranted. 6. Persistent wall thickening and mild dilatation of the proximal appendix, of uncertain etiology. Continued attention on followup exams is warranted.   11/06/2016 Imaging   CT C/A/P: IMPRESSION: 1. Stable pulmonary metastatic disease. 2. Similar appearing pancreatic head mass. Progressed atrophy of the pancreatic body and tail. Stable peripancreatic lymph node. 3. Stable hypoattenuating lesion partially exophytic off the inferior pole of the left kidney. 4. Aortic atherosclerosis. 5. Persistent mild wall thickening and mild dilatation of the appendix.    01/30/2017 Imaging   Ct C/A/P: IMPRESSION: 1. Stable pulmonary metastatic disease. 2. Stable to slight increase in size of pancreatic head mass. 3.  Aortic Atherosclerosis (ICD10-I70.0). 4. Similar appearance of mild wall thickening of the appendix which contains an appendicolith.   05/01/2017 Imaging   CT C/A/P: Stable disease in the lungs, no progression of disease in the abdomen.   10/04/2018 -  Chemotherapy   The patient had palonosetron (ALOXI) injection 0.25 mg, 0.25 mg, Intravenous,  Once, 2 of 4 cycles Administration: 0.25 mg (10/04/2018), 0.25 mg (10/25/2018) pegfilgrastim (NEULASTA) injection 6 mg, 6 mg, Subcutaneous, Once, 1 of 3 cycles Administration: 6 mg (10/27/2018) bevacizumab (AVASTIN) 450 mg in sodium chloride 0.9 % 100 mL chemo infusion, 5 mg/kg = 450 mg, Intravenous,  Once, 1 of 3 cycles irinotecan (CAMPTOSAR) 380 mg in sodium chloride 0.9 % 500 mL chemo infusion, 180 mg/m2 = 380 mg, Intravenous,  Once, 2 of 4 cycles Dose modification: 144 mg/m2 (original dose 180 mg/m2, Cycle 2, Reason: Provider  Judgment, Comment: 20% dose reduction) Administration: 380 mg (10/04/2018), 300 mg (10/25/2018) leucovorin 900 mg in sodium chloride 0.9 % 250 mL infusion, 421 mg/m2 = 856 mg, Intravenous,  Once, 2 of 4 cycles Dose modification: 320 mg/m2 (original dose 400 mg/m2, Cycle 2, Reason: Provider Judgment, Comment: 20% dose reduction) Administration: 900 mg (10/04/2018), 700 mg (10/25/2018) fluorouracil (ADRUCIL) chemo injection 850 mg, 400 mg/m2 = 850 mg, Intravenous,  Once, 2 of 4 cycles Dose modification: 320 mg/m2 (original dose 400 mg/m2, Cycle 2, Reason: Provider Judgment, Comment: 20% dose reduction) Administration: 850 mg (10/04/2018), 700 mg (10/25/2018) fosaprepitant (EMEND) 150 mg, dexamethasone (DECADRON) 12 mg in sodium chloride 0.9 % 145 mL IVPB, , Intravenous,  Once, 0 of 2 cycles fluorouracil (ADRUCIL) 5,000 mg in sodium chloride 0.9 % 150 mL chemo infusion, 2,325 mg/m2 = 5,150 mg, Intravenous, 1 Day/Dose, 2 of 4 cycles Dose modification: 1,920 mg/m2 (original dose 2,400 mg/m2, Cycle 2, Reason: Provider Judgment, Comment: 20% dose reduction) Administration: 5,000 mg (10/04/2018), 4,100 mg (10/25/2018)  for chemotherapy treatment.       CANCER STAGING: Cancer Staging Rectal cancer ([place]) Staging form: Colon and Rectum, AJCC 7th Edition - Clinical: Stage IIIB (T3, N1, M0) - Signed by [name], MD on 02/04/2011 - Pathologic stage from 11/17/2014: Stage IVA (M1a) - Signed by Baird Cancer, PA-C on 09/14/2015    INTERVAL HISTORY:  Mr. Reh 68 y.o. male seen for follow-up of metastatic rectal cancer.  He seems  to be upbeat today.  However he has difficulty swallowing.  He is spitting up any solid foods.  He is drinking about 6 cans of Ensure without any problem.  He is able to get dressed this morning and move about the house.  He was able to eat baked potato, ice cream and bag of cheese crackers yesterday.  Gained about 1 and half pounds since last visit 2 days ago.  Denies any  fevers or infections.  REVIEW OF SYSTEMS:  Review of Systems  Constitutional: Positive for fatigue.  HENT:   Positive for trouble swallowing.   Eyes: Negative.   Respiratory: Negative.   Cardiovascular: Negative.   Gastrointestinal: Negative for nausea.  Genitourinary: Negative.    Musculoskeletal: Negative.   Skin: Negative.   Neurological: Positive for numbness.  Hematological: Negative.   Psychiatric/Behavioral: The patient is nervous/anxious.   All other systems reviewed and are negative.    PAST MEDICAL/SURGICAL HISTORY:  Past Medical History:  Diagnosis Date  . Chronic anticoagulation   . Colon cancer ([place]) 07/24/2010   rectal ca, inv adenocarcinoma  . Depression 04/01/2011  . Diabetes mellitus without complication ([place])   . DVT (deep venous thrombosis) ([place]) 05/09/2011  . GERD (gastroesophageal reflux disease)   . High output ileostomy ([place]) 05/21/2011  . History of kidney stones   . HTN (hypertension)   . Hx of radiation therapy 09/02/10 to 10/14/10   pelvis  . Lung metastasis ([place])   . Neuropathy   . Peripheral vascular disease ([place])    dvt's,pe  . Pneumonia    hx x3  . Pulmonary embolism ([place])   . Rectal cancer ([place]) 01/15/2011   S/P radiation and concurrent 5-FU continuous infusion from 09/09/10- 10/10/10.  S/P proctectomy with colorectal anastomosis and diverting loop ileostomy on 11/14/10 at [facility] by Dr. Harlon Ditty. Pathology reveals a pT3b N1 with 3/20 lymph nodes.     Past Surgical History:  Procedure Laterality Date  . BILIARY STENT PLACEMENT N/A 09/16/2018   Procedure: STENT PLACEMENT;  Surgeon: Rogene Houston, MD;  [facility];  Service: Endoscopy;  Laterality: N/A;  . BIOPSY  09/24/2018   Procedure: BIOPSY;  Surgeon: Danie Binder, MD;  [facility];  Service: Endoscopy;;  . COLON SURGERY  11/14/2010   proctectomy with colorectal anastomosis and diverting loop ileostomy (temporary planned)  . COLONOSCOPY  07/2010   proximal  rectal apple core mass 10-14cm from anal verge (adenocarcinoma), 2-3cm distal rectal carpet polyp s/p piecemeal snare polypectomy (adenoma)  . COLONOSCOPY  04/21/2012   RMR: Friable,fibrotic appearing colorectal anastomosis producing some luminal narrowing-not felt to be critical. path: focal erosion with slight inflammation and hyperemia. SURVEILLANCE DUE DEC 2015  . COLONOSCOPY N/A 11/24/2013   Dr. Rourk:somewhat fibrotic/friable anastomotic mucosal-status post biopsy (narrowing not felt to be clinically significant) Single colonic diverticulum. benign polypoid rectal mucosa  . colostomy reversal  april 2013  . ERCP N/A 09/16/2018   Procedure: ENDOSCOPIC RETROGRADE CHOLANGIOPANCREATOGRAPHY (ERCP);  Surgeon: Rogene Houston, MD;  [facility];  Service: Endoscopy;  Laterality: N/A;  . ESOPHAGOGASTRODUODENOSCOPY  07/2010   RMR: schatki ring s/p dilation, small hh, SB bx benign  . ESOPHAGOGASTRODUODENOSCOPY N/A 09/04/2015   Procedure: ESOPHAGOGASTRODUODENOSCOPY (EGD);  Surgeon: Daneil Dolin, MD;  [facility];  Service: Endoscopy;  Laterality: N/A;  730  . EUS  08/2010   Dr. Owens Loffler. uT3N0 circumferential, nearly obstruction rectosigmoid adenocarcinoma, distal edge 12cm from anal verge  . EUS  N/A 06/19/2016   Procedure: UPPER ENDOSCOPIC ULTRASOUND (EUS) RADIAL;  Surgeon: Milus Banister, MD;  Location: [facility];  Service: Endoscopy;  Laterality: N/A;  . FLEXIBLE SIGMOIDOSCOPY N/A 09/24/2018   Procedure: FLEXIBLE SIGMOIDOSCOPY;  Surgeon: Danie Binder, MD;  [facility];  Service: Endoscopy;  Laterality: N/A;  . HERNIA REPAIR     abd hernia repair  . IVC filter    . ivc filter    . port a cath placement    . PORT-A-CATH REMOVAL  09/24/2011   Procedure: REMOVAL PORT-A-CATH;  Surgeon: Donato Heinz, MD;  Location: [facility];  Service: General;  Laterality: N/A;  Minor Room  . PORTACATH PLACEMENT Right 07/07/2016   Procedure: INSERTION PORT-A-CATH;  Surgeon:  Aviva Signs, MD;  Location: [facility];  Service: General;  Laterality: Right;  . SPHINCTEROTOMY N/A 09/16/2018   Procedure: SPHINCTEROTOMY with balloon dialation;  Surgeon: Rogene Houston, MD;  [facility];  Service: Endoscopy;  Laterality: N/A;  . TRANSVERSE LOOP COLOSTOMY N/A 09/29/2018   Procedure: TRANSVERSE LOOP COLOSTOMY;  Surgeon: Aviva Signs, MD;  Location: [facility];  Service: General;  Laterality: N/A;  . VIDEO ASSISTED THORACOSCOPY (VATS)/WEDGE RESECTION Right 11/15/2014   Procedure: VIDEO ASSISTED THORACOSCOPY (VATS)/LUNG RESECTION WITH RIGHT LINGULECTOMY;  Surgeon: [name], MD;  Location: [facility] [place];  Service: Thoracic;  Laterality: Right;  Marland Kitchen VIDEO BRONCHOSCOPY N/A 11/15/2014   Procedure: VIDEO BRONCHOSCOPY;  Surgeon: [name], MD;  Location: [facility] OR;  Service: Thoracic;  Laterality: N/A;     SOCIAL HISTORY:  Social History   Socioeconomic History  . Marital status: Married    Spouse name: Not on file  . Number of children: 2  . Years of education: Not on file  . Highest education level: Not on file  Occupational History  . Occupation: self-employed Regulatory affairs officer, currently not working    Fish farm manager: SELF EMPLOYED  Social Needs  . Financial resource strain: Not very hard  . Food insecurity    Worry: Never true    Inability: Never true  . Transportation needs    Medical: No    Non-medical: No  Tobacco Use  . Smoking status: Never Smoker  . Smokeless tobacco: Never Used  Substance and Sexual Activity  . Alcohol use: No  . Drug use: No  . Sexual activity: Yes    Birth control/protection: None    Comment: married  Lifestyle  . Physical activity    Days per week: 0 days    Minutes per session: 0 min  . Stress: Only a little  Relationships  . Social connections    Talks on phone: More than three times a week    Gets together: Once a week    Attends religious service: Patient refused    Active member of club or organization:  Patient refused    Attends meetings of clubs or organizations: Patient refused    Relationship status: Patient refused  . Intimate partner violence    Fear of current or ex partner: Patient refused    Emotionally abused: Patient refused    Physically abused: Patient refused    Forced sexual activity: Patient refused  Other Topics Concern  . Not on file  Social History Narrative  . Not on file    FAMILY HISTORY:  Family History  Problem Relation Age of Onset  . Cancer Brother        throat  . Cancer Brother  prostate  . Colon cancer Neg Hx   . Liver disease Neg Hx   . Inflammatory bowel disease Neg Hx     CURRENT MEDICATIONS:  Outpatient Encounter Medications as of 11/24/2018  Medication Sig  . acetaminophen (TYLENOL) 500 MG tablet Take 1,000 mg by mouth every 6 (six) hours as needed for mild pain or moderate pain.   . clonazePAM (KLONOPIN) 1 MG tablet Take 1 tablet (1 mg total) by mouth 3 (three) times daily as needed.  . Diphenhyd-Hydrocort-Nystatin (FIRST-DUKES MOUTHWASH) SUSP Use as directed 5 mLs in the mouth or throat 4 (four) times daily.  . diphenoxylate-atropine (LOMOTIL) 2.5-0.025 MG tablet Take 1 tablet by mouth 4 (four) times daily as needed for diarrhea or loose stools. (Patient not taking: Reported on 11/16/2018)  . dronabinol (MARINOL) 5 MG capsule Take 1 capsule (5 mg total) by mouth 2 (two) times daily before a meal. (Patient not taking: Reported on 11/22/2018)  . gabapentin (NEURONTIN) 300 MG capsule Take 3 capsules (900 mg total) by mouth 2 (two) times daily.  Marland Kitchen glimepiride (AMARYL) 2 MG tablet Take 2 mg by mouth daily with breakfast.   . megestrol (MEGACE) 400 MG/10ML suspension Take 10 mLs (400 mg total) by mouth 2 (two) times daily. (Patient not taking: Reported on 11/22/2018)  . mirtazapine (REMERON) 7.5 MG tablet   . omeprazole (PRILOSEC) 20 MG capsule Take 20 mg by mouth 2 (two) times daily before a meal.   . ondansetron (ZOFRAN) 8 MG tablet Take 1  tablet (8 mg total) by mouth 2 (two) times daily as needed for refractory nausea / vomiting. Start on day 3 after chemotherapy.  Marland Kitchen oxyCODONE-acetaminophen (PERCOCET) 10-325 MG tablet Take 1 tablet by mouth every 6 (six) hours as needed for pain. (Patient not taking: Reported on 11/22/2018)  . Probiotic Product (PROBIOTIC DAILY PO) Take 1 capsule by mouth daily.  . prochlorperazine (COMPAZINE) 10 MG tablet Take 1 tablet (10 mg total) by mouth every 6 (six) hours as needed for nausea or vomiting.  . promethazine (PHENERGAN) 25 MG tablet Take 1 tablet (25 mg total) by mouth every 8 (eight) hours as needed for refractory nausea / vomiting.  . rivaroxaban (XARELTO) 20 MG TABS tablet Take 1 tablet (20 mg total) by mouth daily with supper.  . traZODone (DESYREL) 100 MG tablet Take 1 tablet (100 mg total) by mouth at bedtime.  . vitamin B-12 (CYANOCOBALAMIN) 1000 MCG tablet Take 1,000 mcg by mouth daily.   Facility-Administered Encounter Medications as of 11/24/2018  Medication  . [COMPLETED] heparin lock flush 100 unit/mL  . [COMPLETED] sodium chloride 0.9 % 1,000 mL with potassium chloride 20 mEq, magnesium sulfate 2 g infusion  . sodium chloride 0.9 % injection 10 mL  . sodium chloride flush (NS) 0.9 % injection 10 mL  . [COMPLETED] sodium chloride flush (NS) 0.9 % injection 10 mL    ALLERGIES:  Allergies  Allergen Reactions  . Oxycodone     Blisters, hallucinations  . Tramadol     Blisters, hallucinations  . Trazodone And Nefazodone Other (See Comments)    hallucinations     PHYSICAL EXAM:  ECOG Performance status: 2  There were no vitals filed for this visit. There were no vitals filed for this visit.  Physical Exam Constitutional:      Appearance: Normal appearance.  HENT:     Head: Normocephalic.     Mouth/Throat:     Mouth: Mucous membranes are moist.     Pharynx: Oropharynx is clear.  Eyes:     Extraocular Movements: Extraocular movements intact.     Conjunctiva/sclera:  Conjunctivae normal.  Cardiovascular:     Rate and Rhythm: Normal rate and regular rhythm.     Pulses: Normal pulses.     Heart sounds: Normal heart sounds.  Pulmonary:     Effort: Pulmonary effort is normal.     Breath sounds: Normal breath sounds.  Abdominal:     General: Bowel sounds are normal.     Palpations: Abdomen is soft.  Musculoskeletal: Normal range of motion.  Skin:    General: Skin is warm and dry.  Neurological:     General: No focal deficit present.     Mental Status: He is alert and oriented to person, place, and time.  Psychiatric:        Mood and Affect: Mood normal.        Behavior: Behavior normal.        Thought Content: Thought content normal.        Judgment: Judgment normal.      LABORATORY DATA:  I have reviewed the labs as listed.  CBC    Component Value Date/Time   WBC 13.7 (H) 11/24/2018 0758   RBC 4.35 11/24/2018 0758   HGB 13.1 11/24/2018 0758   HGB 11.6 (L) 02/04/2011 1059   HCT 40.3 11/24/2018 0758   HCT 34.7 (L) 02/04/2011 1059   PLT 304 11/24/2018 0758   PLT 282 02/04/2011 1059   MCV 92.6 11/24/2018 0758   MCV 76.0 (L) 02/04/2011 1059   MCH 30.1 11/24/2018 0758   MCHC 32.5 11/24/2018 0758   RDW 14.5 11/24/2018 0758   RDW 18.5 (H) 02/04/2011 1059   LYMPHSABS 1.8 11/24/2018 0758   LYMPHSABS 1.1 02/04/2011 1059   MONOABS 0.8 11/24/2018 0758   MONOABS 0.5 02/04/2011 1059   EOSABS 0.2 11/24/2018 0758   EOSABS 0.1 02/04/2011 1059   BASOSABS 0.1 11/24/2018 0758   BASOSABS 0.1 02/04/2011 1059   CMP Latest Ref Rng & Units 11/24/2018 11/16/2018 11/08/2018  Glucose 70 - 99 mg/dL 265(H) 209(H) 132(H)  BUN 8 - 23 mg/dL _0 Creatinine 0.61 - 1.24 mg/dL 0.73 0.70 0.61  Sodium 135 - 145 mmol/L 136 135 139  Potassium 3.5 - 5.1 mmol/L 3.9 4.3 3.5  Chloride 98 - 111 mmol/L 103 107 107  CO2 22 - 32 mmol/L 23 20(L) 25  Calcium 8.9 - 10.3 mg/dL 9.1 9.0 8.9  Total Protein 6.5 - 8.1 g/dL 6.5 6.6 6.2(L)  Total Bilirubin 0.3 - 1.2 mg/dL  1.3(H) 1.0 0.9  Alkaline Phos 38 - 126 U/L 105 111 104  AST 15 - 41 U/L _1 ALT 0 - 44 U/L _2 ASSESSMENT & PLAN:   Rectal cancer 1.  Stage IV rectal cancer with lung and pancreatic metastasis: Foundation 1 CDX shows KRAS G13D and NRAS wild type, MS-stable. -12 cycles of FOLFOX in the adjuvant setting from October 12 through 07/14/2011. -15 cycles of FOLFOX and bevacizumab from 07/08/2016 through 01/20/2017 followed by infusional 5-FU and bevacizumab until 07/14/2017. -Maintenance Xeloda from 08/07/2017 with bevacizumab through 08/19/2018 with progression. - ERCP and stent placement on 09/16/2018 showing single localized biliary stricture.  Biliary sphincterotomy was performed with stricture dilated to 6 mm with balloon to facilitate stent placement.  1 covered metal stent was placed. -Flexible sigmoidoscopy on 09/24/2018 showing narrowing of the colorectal anastomosis.  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 -  2 cycles of FOLFIRI from 10/04/2018 through 10/25/2018 with 20% dose reduction during cycle 2. - He has developed severe tiredness after cycle 2 even after dose reduction. 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  Will consider dose reduction at next cycle. - Today he looks much better in terms of energy.  He is drinking about 6 ensures per day.  He ate some baked potatoes, ice cream and 1 bag of cheese crackers yesterday. -He reports difficulty swallowing solid foods.  They are coming back up.  He reports that Dr. Buford Dresser did throat stretching a few years ago.  I will reach out to him to see if any procedure is helpful. -We will reevaluate him in 1 week.   2.  Weight loss: -He gained 1 and half pounds in the last 2 days.  Mirtazapine and Marinol did not work. -He started taking Megace twice daily on 11/07/2018.  He is drinking 6 cans of Ensure daily.   3.  DVT and PE: - He will  continue Xarelto.  He was diagnosed in 2016, status post IVC filter.  4.  Anxiety: -He is taking Klonopin 1 mg 3 times a day.  This is helping most days.  Total time spent is 25 minutes with more than 50% of the time spent face-to-face discussing further work-up, side effect management and coordination of care.    Orders placed this encounter:  No orders of the defined types were placed in this encounter.     Derek Jack, MD  [place] [phone]

## 2018-11-24 NOTE — Assessment & Plan Note (Signed)
1.  Stage IV rectal cancer with lung and pancreatic metastasis: Foundation 1 CDX shows KRAS G13D and NRAS wild type, MS-stable. -12 cycles of FOLFOX in the adjuvant setting from October 12 through 07/14/2011. -15 cycles of FOLFOX and bevacizumab from 07/08/2016 through 01/20/2017 followed by infusional 5-FU and bevacizumab until 07/14/2017. -Maintenance Xeloda from 08/07/2017 with bevacizumab through 08/19/2018 with progression. - ERCP and stent placement on 09/16/2018 showing single localized biliary stricture.  Biliary sphincterotomy was performed with stricture dilated to 6 mm with balloon to facilitate stent placement.  1 covered metal stent was placed. -Flexible sigmoidoscopy on 09/24/2018 showing narrowing of the colorectal anastomosis.  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 - 2 cycles of FOLFIRI from 10/04/2018 through 10/25/2018 with 20% dose reduction during cycle 2. - He has developed severe tiredness after cycle 2 even after dose reduction. 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  Will consider dose reduction at next cycle. - Today he looks much better in terms of energy.  He is drinking about 6 ensures per day.  He ate some baked potatoes, ice cream and 1 bag of cheese crackers yesterday. -He reports difficulty swallowing solid foods.  They are coming back up.  He reports that Dr. Buford Dresser did throat stretching a few years ago.  I will reach out to him to see if any procedure is helpful. -We will reevaluate him in 1 week.   2.  Weight loss: -He gained 1 and half pounds in the last 2 days.  Mirtazapine and Marinol did not work. -He started taking Megace twice daily on 11/07/2018.  He is drinking 6 cans of Ensure daily.   3.  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  He was diagnosed in 2016, status post IVC filter.  4.  Anxiety: -He is taking Klonopin 1 mg 3 times a day.  This is helping most days.

## 2018-11-24 NOTE — Progress Notes (Signed)
To treatment area for oncology follow up visit and possible treatment.  Patient drinking extra ensures and a little improvement in fatigue.  Gabapentin controls his neuropathy.  Stated his cough sounds better.  Described as "not as tight" per wife but sounds more "croupy" and non-productive.  Dr. [name] made aware for visit.  No s/s of distress noted.   Patients treatment to be held today and receive house hydration only.  Chemotherapy to be scheduled for next week with dose reduction of irinotecan per Dr. [name].   Patient tolerated hydration with no complaints voiced.  Port site clean and dry with no bruising or swelling noted.  Good blood return noted.  Band aid applied.  VSs with discharge and left by wheelchair with no s/s of distress noted.

## 2018-11-25 ENCOUNTER — Inpatient Hospital Stay (HOSPITAL_BASED_OUTPATIENT_CLINIC_OR_DEPARTMENT_OTHER): Payer: Medicare Other | Admitting: Hematology

## 2018-11-25 ENCOUNTER — Encounter (HOSPITAL_COMMUNITY): Payer: Self-pay | Admitting: Hematology

## 2018-11-25 ENCOUNTER — Ambulatory Visit (HOSPITAL_COMMUNITY)
Admission: RE | Admit: 2018-11-25 | Discharge: 2018-11-25 | Disposition: A | Discharge status: Home / Self Care | Payer: Medicare Other | Source: Ambulatory Visit | Attending: Hematology | Admitting: Hematology

## 2018-11-25 VITALS — BP 137/88 | HR 103 | Temp 98.7°F | Resp 18 | Wt 169.6 lb

## 2018-11-25 DIAGNOSIS — G893 Neoplasm related pain (acute) (chronic): Secondary | ICD-10-CM

## 2018-11-25 DIAGNOSIS — C7800 Secondary malignant neoplasm of unspecified lung: Secondary | ICD-10-CM | POA: Insufficient documentation

## 2018-11-25 DIAGNOSIS — F419 Anxiety disorder, unspecified: Secondary | ICD-10-CM

## 2018-11-25 DIAGNOSIS — R05 Cough: Secondary | ICD-10-CM | POA: Diagnosis not present

## 2018-11-25 DIAGNOSIS — C7801 Secondary malignant neoplasm of right lung: Secondary | ICD-10-CM | POA: Diagnosis not present

## 2018-11-25 DIAGNOSIS — R0602 Shortness of breath: Secondary | ICD-10-CM | POA: Diagnosis not present

## 2018-11-25 DIAGNOSIS — C20 Malignant neoplasm of rectum: Secondary | ICD-10-CM | POA: Diagnosis not present

## 2018-11-25 DIAGNOSIS — C7901 Secondary malignant neoplasm of right kidney and renal pelvis: Secondary | ICD-10-CM

## 2018-11-25 DIAGNOSIS — J029 Acute pharyngitis, unspecified: Secondary | ICD-10-CM | POA: Diagnosis not present

## 2018-11-25 DIAGNOSIS — R634 Abnormal weight loss: Secondary | ICD-10-CM

## 2018-11-25 DIAGNOSIS — Z86718 Personal history of other venous thrombosis and embolism: Secondary | ICD-10-CM

## 2018-11-25 DIAGNOSIS — Z5111 Encounter for antineoplastic chemotherapy: Secondary | ICD-10-CM | POA: Diagnosis not present

## 2018-11-25 DIAGNOSIS — Z79899 Other long term (current) drug therapy: Secondary | ICD-10-CM | POA: Diagnosis not present

## 2018-11-25 DIAGNOSIS — C7889 Secondary malignant neoplasm of other digestive organs: Secondary | ICD-10-CM | POA: Diagnosis not present

## 2018-11-25 DIAGNOSIS — Z7901 Long term (current) use of anticoagulants: Secondary | ICD-10-CM | POA: Diagnosis not present

## 2018-11-25 DIAGNOSIS — C189 Malignant neoplasm of colon, unspecified: Secondary | ICD-10-CM

## 2018-11-25 DIAGNOSIS — Z86711 Personal history of pulmonary embolism: Secondary | ICD-10-CM | POA: Diagnosis not present

## 2018-11-25 DIAGNOSIS — Z933 Colostomy status: Secondary | ICD-10-CM | POA: Diagnosis not present

## 2018-11-25 MED ORDER — LEVOFLOXACIN 500 MG PO TABS: 500.0000 mg | ORAL_TABLET | Freq: Every day | ORAL | 0 refills | Status: DC

## 2018-11-25 NOTE — Patient Instructions (Signed)
[facility]  Discharge Instructions:  You saw Renee Nester, NP, today. _______________________________________________________________  Thank you for choosing [facility] at [facility] to provide your oncology and hematology care.  To afford each patient quality time with our providers, please arrive at least 15 minutes before your scheduled appointment.  You need to re-schedule your appointment if you arrive 10 or more minutes late.  We strive to give you quality time with our providers, and arriving late affects you and other patients whose appointments are after yours.  Also, if you no show three or more times for appointments you may be dismissed from the clinic.  Again, thank you for choosing  [facility]. Our hope is that these requests will allow you access to exceptional care and in a timely manner. _______________________________________________________________  If you have questions after your visit, please contact our office at [phone] between the hours of 8:30 a.m. and 5:00 p.m. Voicemails left after 4:30 p.m. will not be returned until the following business day. _______________________________________________________________  For prescription refill requests, have your pharmacy contact our office. _______________________________________________________________  Recommendations made by the consultant and any test results will be sent to your referring physician. _______________________________________________________________ 

## 2018-11-25 NOTE — Assessment & Plan Note (Signed)
1.  Stage IV rectal cancer with lung and pancreatic metastasis: Foundation 1 CDX shows KRAS G13D and NRAS wild type, MS-stable. -12 cycles of FOLFOX in the adjuvant setting from October 12 through 07/14/2011. -15 cycles of FOLFOX and bevacizumab from 07/08/2016 through 01/20/2017 followed by infusional 5-FU and bevacizumab until 07/14/2017. -Maintenance Xeloda from 08/07/2017 with bevacizumab through 08/19/2018 with progression. - ERCP and stent placement on 09/16/2018 showing single localized biliary stricture.  Biliary sphincterotomy was performed with stricture dilated to 6 mm with balloon to facilitate stent placement.  1 covered metal stent was placed. -Flexible sigmoidoscopy on 09/24/2018 showing narrowing of the colorectal anastomosis.  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 - 2 cycles of FOLFIRI from 10/04/2018 through 10/25/2018 with 20% dose reduction during cycle 2. - He has developed severe tiredness after cycle 2 even after dose reduction. 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  Will consider dose reduction at next cycle. - Patient presented today with concerns of infection with fever.  Chest x-ray was performed and was negative for pneumonia.  Have recommended patient be started on Levaquin 500 mg daily for possible infectious process. -Patient will return next week treatment.   2.  Weight loss: -He gained 1 and half pounds in the last 2 days.  Mirtazapine and Marinol did not work. -He started taking Megace twice daily on 11/07/2018.  He is drinking 6 cans of Ensure daily. - He is drinking about 6 ensures per day.   -He reports difficulty swallowing solid foods.  They are coming back up.  He reports that Dr. Buford Dresser did throat stretching a few years ago.  I will reach out to him to see if any procedure is helpful.   3.  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  He was diagnosed in 2016, status post  IVC filter.  4.  Anxiety: -He is taking Klonopin 1 mg 3 times a day.  This is helping most days.

## 2018-11-25 NOTE — Progress Notes (Signed)
Charles [name], Charles [name] [id number]   CLINIC:  Medical Oncology/Hematology  PCP:  [name], MD [place] [address] [phone]   REASON FOR VISIT:  Follow-up for Stage IV Colon Cancer  CURRENT THERAPY: FOLFIRI  BRIEF ONCOLOGIC HISTORY:  Oncology History  Rectal cancer ([place])  07/24/2010 Initial Diagnosis   Invasive adenocarcinoma of rectum   09/09/2010 Concurrent Chemotherapy   S/P radiation and concurrent 5-FU continuous infusion from 09/09/10- 10/10/10.   11/14/2010 Surgery   S/P proctectomy with colorectal anastomosis and diverting loop ileostomy on 11/14/10 at [facility] by Dr. Harlon Ditty. Pathology reveals a pT3b N1 with 3/20 lymph nodes.   02/05/2011 - 07/14/2011 Chemotherapy   FOLFOX   08/18/2011 Surgery   Approximate date of surgery- [place] by Dr. Harlon Ditty    Remission     11/24/2013 Survivorship   Colonoscopy- somewhat fibrotic/friable anastomotic mucosal-status post biopsy (narrowing not felt to be clinically significant).  Negative pathology for malignancy   09/12/2014 Imaging   DVT in the left femoral venous system, left common iliac vein, IVC, and within the IVC filter Right lower extremity venography confirms chronic occlusion of the femoral venous system with collateralization. The right iliac venous system is patent and do   09/25/2014 PET scan   The right middle lobe pulmonary nodule is hypermetabolic, favored to represent a primary bronchogenic carcinoma.Equivocal mediastinal nodes, similar to surrounding blood pool. Bilateral adrenal hypermetabolism, felt to be physiologic   10/24/2014 Pathology Results   Lung, needle/core biopsy(ies), RML - ADENOCARCINOMA, SEE COMMENT metastatic adenocarcinoma of a colorectal primary   10/24/2014 Relapse/Recurrence     11/16/2014 Definitive Surgery   Bronchoscopy, right video-assisted thoracoscopy, wedge resection of right middle lobe by Dr. Servando Snare   11/16/2014  Pathology Results   Lung, wedge biopsy/resection, right lingula and small portion of middle lobe - METASTATIC ADENOCARCINOMA, CONSISTENT WITH COLORECTAL PRIMARY, SPANNING 2.0 CM. - THE SURGICAL RESECTION MARGINS ARE NEGATIVE FOR ADENOCARCINOMA.   11/16/2014 Remission   THE SURGICAL RESECTION MARGINS ARE NEGATIVE FOR ADENOCARCINOMA.   03/07/2015 Imaging   CT CAP- Interval resection of right middle lobe metastasis. No acute process or evidence of metastatic disease in the chest, abdomen or pelvis. Improved right upper lobe reticular nodular opacities are favored to represent resolving infection.   09/14/2015 Imaging   CT CAP- No findings of recurrent malignancy. No recurrence along the wedge resection site of the right middle lobe. Right anterior abdominal wall focal hernia containing a knuckle of small bowel without complicating feature.   06/13/2016 Imaging   CT CAP- 1. New right-sided pleural metastasis/mass. Other smaller right-sided pulmonary nodules which are all pleural-based and most likely represent pleural metastasis. 2. No convincing evidence of abdominopelvic nodal metastasis. 3. Constellation of findings, including pancreatic atrophy, duct dilatation, and pancreatic head soft tissue fullness which are highly suspicious for pancreatic adenocarcinoma. Metastatic disease felt much less likely. Consider endoscopic ultrasound sampling or ERCP. Cannot exclude superimposed acute pancreatitis. 4. New enlargement of the appendiceal tip with subtle surrounding edema. Cannot exclude early or mild appendicitis. 5. Coronary artery atherosclerosis. Aortic atherosclerosis. 6. 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   06/13/2016 Progression   CT scan demonstrates progression of disease   06/19/2016 Procedure   EUS with FNA by Dr. Ardis Hughs   06/20/2016 Pathology Results   FINE NEEDLE ASPIRATION, ENDOSCOPIC, PANCREAS UNCINATE AREA(SPECIMEN 1 OF 1 COLLECTED 06/19/16):  MALIGNANT CELLS CONSISTENT WITH METASTATIC ADENOCARCINOMA.  06/25/2016 PET scan   1. Two pleural-based nodules in the right hemithorax are hypermetabolic. Metastatic disease is a distinct consideration. The scattered pulmonary parenchymal nodule seen on previous diagnostic CT imaging are below the threshold for reliable resolution on PET imaging. 2. Hypermetabolic lesion pancreatic head, consistent with neoplasm. As noted on prior CT, adenocarcinoma is any consideration. No evidence for hypermetabolic abdominal lymphadenopathy. Mottled uptake noted in the liver, but no discrete hepatic metastases are evident on PET imaging. 3. Appendix remains distended up to 0.9-10 mm diameter with a stone towards the tip in subtle periappendiceal edema/inflammation. The appendix is hypermetabolic along its length. Imaging features are relatively stable in the 12 day interval since prior CT scan. While appendicitis is a consideration, the relative stability over 12 days would be unusual for that etiology. Continued close follow-up recommended.   06/26/2016 Pathology Results   Not enough tissue for foundationONE or K-ras testing.   07/07/2016 Procedure   Port placed by Dr. Arnoldo Morale   07/08/2016 -  Chemotherapy   The patient had pegfilgrastim (NEULASTA) injection 6 mg, 6 mg, Subcutaneous,  Once, 0 of 4 cycles  ondansetron (ZOFRAN) IVPB 8 mg, 8 mg, Intravenous,  Once, 0 of 4 cycles  leucovorin 804 mg in dextrose 5 % 250 mL infusion, 400 mg/m2, Intravenous,  Once, 0 of 4 cycles  oxaliplatin (ELOXATIN) 170 mg in dextrose 5 % 500 mL chemo infusion, 85 mg/m2, Intravenous,  Once, 0 of 4 cycles  fluorouracil (ADRUCIL) 4,800 mg in sodium chloride 0.9 % 150 mL chemo infusion, 2,400 mg/m2 = 4,800 mg, Intravenous, 1 Day/Dose, 0 of 4 cycles  fluorouracil (ADRUCIL) chemo injection 800 mg, 400 mg/m2, Intravenous,  Once, 0 of 4 cycles  pegfilgrastim (NEULASTA) injection 6 mg, 6 mg, Subcutaneous,  Once, 2 of 2  cycles  ondansetron (ZOFRAN) IVPB 8 mg, 8 mg, Intravenous,  Once, 12 of 12 cycles  leucovorin 660 mg in dextrose 5 % 250 mL infusion, 660 mg (original dose ), Intravenous,  Once, 12 of 12 cycles Dose modification: 660 mg (Cycle 1)  oxaliplatin (ELOXATIN) 140 mg in dextrose 5 % 500 mL chemo infusion, 140 mg (original dose ), Intravenous,  Once, 12 of 12 cycles Dose modification: 140 mg (Cycle 1), 119 mg (85 % of original dose 140 mg, Cycle 8, Reason: Provider Judgment)  fluorouracil (ADRUCIL) 3,850 mg in sodium chloride 0.9 % 150 mL chemo infusion, 3,850 mg (original dose ), Intravenous, 1 Day/Dose, 12 of 12 cycles Dose modification: 3,850 mg (Cycle 1)  fluorouracil (ADRUCIL) chemo injection 700 mg, 700 mg (original dose ), Intravenous,  Once, 12 of 12 cycles Dose modification: 700 mg (Cycle 1)  palonosetron (ALOXI) injection 0.25 mg, 0.25 mg, Intravenous,  Once, 7 of 12 cycles Administration: 0.25 mg (09/30/2016)  bevacizumab (AVASTIN) 475 mg in sodium chloride 0.9 % 100 mL chemo infusion, 5 mg/kg = 475 mg, Intravenous,  Once, 6 of 11 cycles Administration: 500 mg (09/30/2016)  leucovorin 880 mg in dextrose 5 % 250 mL infusion, 400 mg/m2 = 880 mg, Intravenous,  Once, 7 of 12 cycles Administration: 880 mg (09/30/2016)  oxaliplatin (ELOXATIN) 185 mg in dextrose 5 % 500 mL chemo infusion, 85 mg/m2 = 185 mg, Intravenous,  Once, 7 of 12 cycles Administration: 185 mg (09/30/2016)  fluorouracil (ADRUCIL) chemo injection 900 mg, 400 mg/m2 = 900 mg, Intravenous,  Once, 7 of 12 cycles Administration: 900 mg (09/30/2016)  fluorouracil (ADRUCIL) 5,300 mg in sodium chloride 0.9 % 144 mL chemo infusion, 2,400 mg/m2 = 5,300  mg, Intravenous, 1 Day/Dose, 7 of 12 cycles Administration: 5,300 mg (09/30/2016)  for chemotherapy treatment.     08/27/2016 Imaging   CT CAP- 1. Pulmonary metastatic disease is stable. 2. Pancreatic head mass, grossly stable, with progressive atrophy of the body and tail of the  pancreas. Stable peripancreatic lymph node. 3. Mild circumferential rectal wall thickening, stable. 4. Aortic atherosclerosis (ICD10-170.0). Coronary artery calcification. 5. Hyperattenuating lesion off the lower pole left kidney, stable, too small to characterize. Continued attention on followup exams is warranted. 6. Persistent wall thickening and mild dilatation of the proximal appendix, of uncertain etiology. Continued attention on followup exams is warranted.   11/06/2016 Imaging   CT C/A/P: IMPRESSION: 1. Stable pulmonary metastatic disease. 2. Similar appearing pancreatic head mass. Progressed atrophy of the pancreatic body and tail. Stable peripancreatic lymph node. 3. Stable hypoattenuating lesion partially exophytic off the inferior pole of the left kidney. 4. Aortic atherosclerosis. 5. Persistent mild wall thickening and mild dilatation of the appendix.    01/30/2017 Imaging   Ct C/A/P: IMPRESSION: 1. Stable pulmonary metastatic disease. 2. Stable to slight increase in size of pancreatic head mass. 3.  Aortic Atherosclerosis (ICD10-I70.0). 4. Similar appearance of mild wall thickening of the appendix which contains an appendicolith.   05/01/2017 Imaging   CT C/A/P: Stable disease in the lungs, no progression of disease in the abdomen.   10/04/2018 -  Chemotherapy   The patient had palonosetron (ALOXI) injection 0.25 mg, 0.25 mg, Intravenous,  Once, 2 of 4 cycles Administration: 0.25 mg (10/04/2018), 0.25 mg (10/25/2018) pegfilgrastim (NEULASTA) injection 6 mg, 6 mg, Subcutaneous, Once, 1 of 3 cycles Administration: 6 mg (10/27/2018) bevacizumab (AVASTIN) 450 mg in sodium chloride 0.9 % 100 mL chemo infusion, 5 mg/kg = 450 mg, Intravenous,  Once, 1 of 3 cycles irinotecan (CAMPTOSAR) 380 mg in sodium chloride 0.9 % 500 mL chemo infusion, 180 mg/m2 = 380 mg, Intravenous,  Once, 2 of 4 cycles Dose modification: 144 mg/m2 (original dose 180 mg/m2, Cycle 2, Reason: Provider  Judgment, Comment: 20% dose reduction) Administration: 380 mg (10/04/2018), 300 mg (10/25/2018) leucovorin 900 mg in sodium chloride 0.9 % 250 mL infusion, 421 mg/m2 = 856 mg, Intravenous,  Once, 2 of 4 cycles Dose modification: 320 mg/m2 (original dose 400 mg/m2, Cycle 2, Reason: Provider Judgment, Comment: 20% dose reduction) Administration: 900 mg (10/04/2018), 700 mg (10/25/2018) fluorouracil (ADRUCIL) chemo injection 850 mg, 400 mg/m2 = 850 mg, Intravenous,  Once, 2 of 4 cycles Dose modification: 320 mg/m2 (original dose 400 mg/m2, Cycle 2, Reason: Provider Judgment, Comment: 20% dose reduction) Administration: 850 mg (10/04/2018), 700 mg (10/25/2018) fosaprepitant (EMEND) 150 mg, dexamethasone (DECADRON) 12 mg in sodium chloride 0.9 % 145 mL IVPB, , Intravenous,  Once, 0 of 2 cycles fluorouracil (ADRUCIL) 5,000 mg in sodium chloride 0.9 % 150 mL chemo infusion, 2,325 mg/m2 = 5,150 mg, Intravenous, 1 Day/Dose, 2 of 4 cycles Dose modification: 1,920 mg/m2 (original dose 2,400 mg/m2, Cycle 2, Reason: Provider Judgment, Comment: 20% dose reduction) Administration: 5,000 mg (10/04/2018), 4,100 mg (10/25/2018)  for chemotherapy treatment.       CANCER STAGING: Cancer Staging Rectal cancer ([place]) Staging form: Colon and Rectum, AJCC 7th Edition - Clinical: Stage IIIB (T3, N1, M0) - Signed by [name], MD on 02/04/2011 - Pathologic stage from 11/17/2014: Stage IVA (M1a) - Signed by Charles [name] Cancer, PA-C on 09/14/2015    INTERVAL HISTORY:  Mr. Dini 68 y.o. male presents today for follow-up.  Patient comes in today to  be evaluated due to home health nurse hearing crackles in the lung bases.  Also patient has been running a fever with associated chills with T-max of 101.6 F.  He is afebrile in clinic today.  He states he has a nonproductive cough.  He denies any shortness of breath or chest pain.  He continues to drink 6 ensures a day.  REVIEW OF SYSTEMS:  Review of Systems   Constitutional: Positive for appetite change, chills, fatigue and fever.  HENT:   Positive for trouble swallowing.   Eyes: Negative.   Respiratory: Positive for cough.   Cardiovascular: Negative.   Gastrointestinal: Negative.   Endocrine: Negative.   Genitourinary: Negative.    Musculoskeletal: Positive for gait problem and myalgias.  Skin: Negative.   Neurological: Positive for extremity weakness and gait problem.  Hematological: Negative.   Psychiatric/Behavioral: Positive for depression. The patient is nervous/anxious.      PAST MEDICAL/SURGICAL HISTORY:  Past Medical History:  Diagnosis Date  . Chronic anticoagulation   . Colon cancer ([place]) 07/24/2010   rectal ca, inv adenocarcinoma  . Depression 04/01/2011  . Diabetes mellitus without complication ([place])   . DVT (deep venous thrombosis) ([place]) 05/09/2011  . GERD (gastroesophageal reflux disease)   . High output ileostomy ([place]) 05/21/2011  . History of kidney stones   . HTN (hypertension)   . Hx of radiation therapy 09/02/10 to 10/14/10   pelvis  . Lung metastasis ([place])   . Neuropathy   . Peripheral vascular disease ([place])    dvt's,pe  . Pneumonia    hx x3  . Pulmonary embolism ([place])   . Rectal cancer ([place]) 01/15/2011   S/P radiation and concurrent 5-FU continuous infusion from 09/09/10- 10/10/10.  S/P proctectomy with colorectal anastomosis and diverting loop ileostomy on 11/14/10 at [facility] by Dr. Harlon Ditty. Pathology reveals a pT3b N1 with 3/20 lymph nodes.     Past Surgical History:  Procedure Laterality Date  . BILIARY STENT PLACEMENT N/A 09/16/2018   Procedure: STENT PLACEMENT;  Surgeon: Rogene Houston, MD;  [facility];  Service: Endoscopy;  Laterality: N/A;  . BIOPSY  09/24/2018   Procedure: BIOPSY;  Surgeon: Danie Binder, MD;  [facility];  Service: Endoscopy;;  . COLON SURGERY  11/14/2010   proctectomy with colorectal anastomosis and diverting loop ileostomy (temporary planned)  .  COLONOSCOPY  07/2010   proximal rectal apple core mass 10-14cm from anal verge (adenocarcinoma), 2-3cm distal rectal carpet polyp s/p piecemeal snare polypectomy (adenoma)  . COLONOSCOPY  04/21/2012   RMR: Friable,fibrotic appearing colorectal anastomosis producing some luminal narrowing-not felt to be critical. path: focal erosion with slight inflammation and hyperemia. SURVEILLANCE DUE DEC 2015  . COLONOSCOPY N/A 11/24/2013   Dr. Rourk:somewhat fibrotic/friable anastomotic mucosal-status post biopsy (narrowing not felt to be clinically significant) Single colonic diverticulum. benign polypoid rectal mucosa  . colostomy reversal  april 2013  . ERCP N/A 09/16/2018   Procedure: ENDOSCOPIC RETROGRADE CHOLANGIOPANCREATOGRAPHY (ERCP);  Surgeon: Rogene Houston, MD;  [facility];  Service: Endoscopy;  Laterality: N/A;  . ESOPHAGOGASTRODUODENOSCOPY  07/2010   RMR: schatki ring s/p dilation, small hh, SB bx benign  . ESOPHAGOGASTRODUODENOSCOPY N/A 09/04/2015   Procedure: ESOPHAGOGASTRODUODENOSCOPY (EGD);  Surgeon: Daneil Dolin, MD;  [facility];  Service: Endoscopy;  Laterality: N/A;  730  . EUS  08/2010   Dr. Owens Loffler. uT3N0 circumferential, nearly obstruction rectosigmoid adenocarcinoma, distal edge 12cm from anal verge  . EUS N/A 06/19/2016  Procedure: UPPER ENDOSCOPIC ULTRASOUND (EUS) RADIAL;  Surgeon: Milus Banister, MD;  [facility];  Service: Endoscopy;  Laterality: N/A;  . FLEXIBLE SIGMOIDOSCOPY N/A 09/24/2018   Procedure: FLEXIBLE SIGMOIDOSCOPY;  Surgeon: Danie Binder, MD;  [facility];  Service: Endoscopy;  Laterality: N/A;  . HERNIA REPAIR     abd hernia repair  . IVC filter    . ivc filter    . port a cath placement    . PORT-A-CATH REMOVAL  09/24/2011   Procedure: REMOVAL PORT-A-CATH;  Surgeon: Donato Heinz, MD;  Location: [facility];  Service: General;  Laterality: N/A;  Minor Room  . PORTACATH PLACEMENT Right 07/07/2016   Procedure:  INSERTION PORT-A-CATH;  Surgeon: Aviva Signs, MD;  Location: [facility];  Service: General;  Laterality: Right;  . SPHINCTEROTOMY N/A 09/16/2018   Procedure: SPHINCTEROTOMY with balloon dialation;  Surgeon: Rogene Houston, MD;  [facility];  Service: Endoscopy;  Laterality: N/A;  . TRANSVERSE LOOP COLOSTOMY N/A 09/29/2018   Procedure: TRANSVERSE LOOP COLOSTOMY;  Surgeon: Aviva Signs, MD;  Location: [facility];  Service: General;  Laterality: N/A;  . VIDEO ASSISTED THORACOSCOPY (VATS)/WEDGE RESECTION Right 11/15/2014   Procedure: VIDEO ASSISTED THORACOSCOPY (VATS)/LUNG RESECTION WITH RIGHT LINGULECTOMY;  Surgeon: [name], MD;  Location: [facility];  Service: Thoracic;  Laterality: Right;  Marland Kitchen VIDEO BRONCHOSCOPY N/A 11/15/2014   Procedure: VIDEO BRONCHOSCOPY;  Surgeon: [name], MD;  Location: [facility];  Service: Thoracic;  Laterality: N/A;     SOCIAL HISTORY:  Social History   Socioeconomic History  . Marital status: Married    Spouse name: Not on file  . Number of children: 2  . Years of education: Not on file  . Highest education level: Not on file  Occupational History  . Occupation: self-employed Regulatory affairs officer, currently not working    Fish farm manager: SELF EMPLOYED  Social Needs  . Financial resource strain: Not very hard  . Food insecurity    Worry: Never true    Inability: Never true  . Transportation needs    Medical: No    Non-medical: No  Tobacco Use  . Smoking status: Never Smoker  . Smokeless tobacco: Never Used  Substance and Sexual Activity  . Alcohol use: No  . Drug use: No  . Sexual activity: Yes    Birth control/protection: None    Comment: married  Lifestyle  . Physical activity    Days per week: 0 days    Minutes per session: 0 min  . Stress: Only a little  Relationships  . Social connections    Talks on phone: More than three times a week    Gets together: Once a week    Attends religious service: Patient refused    Active  member of club or organization: Patient refused    Attends meetings of clubs or organizations: Patient refused    Relationship status: Patient refused  . Intimate partner violence    Fear of current or ex partner: Patient refused    Emotionally abused: Patient refused    Physically abused: Patient refused    Forced sexual activity: Patient refused  Other Topics Concern  . Not on file  Social History Narrative  . Not on file    FAMILY HISTORY:  Family History  Problem Relation Age of Onset  . Cancer Brother        throat  . Cancer Brother        prostate  . Colon  cancer Neg Hx   . Liver disease Neg Hx   . Inflammatory bowel disease Neg Hx     CURRENT MEDICATIONS:  Outpatient Encounter Medications as of 11/25/2018  Medication Sig  . acetaminophen (TYLENOL) 500 MG tablet Take 1,000 mg by mouth every 6 (six) hours as needed for mild pain or moderate pain.   . clonazePAM (KLONOPIN) 1 MG tablet Take 1 tablet (1 mg total) by mouth 3 (three) times daily as needed.  . Diphenhyd-Hydrocort-Nystatin (FIRST-DUKES MOUTHWASH) SUSP Use as directed 5 mLs in the mouth or throat 4 (four) times daily.  . diphenoxylate-atropine (LOMOTIL) 2.5-0.025 MG tablet Take 1 tablet by mouth 4 (four) times daily as needed for diarrhea or loose stools. (Patient not taking: Reported on 11/16/2018)  . dronabinol (MARINOL) 5 MG capsule Take 1 capsule (5 mg total) by mouth 2 (two) times daily before a meal. (Patient not taking: Reported on 11/22/2018)  . gabapentin (NEURONTIN) 300 MG capsule Take 3 capsules (900 mg total) by mouth 2 (two) times daily.  Marland Kitchen glimepiride (AMARYL) 2 MG tablet Take 2 mg by mouth daily with breakfast.   . hydrocortisone (CORTEF) 20 MG tablet Take 20 mg by mouth daily.   Marland Kitchen levofloxacin (LEVAQUIN) 500 MG tablet Take 1 tablet (500 mg total) by mouth daily.  . megestrol (MEGACE) 400 MG/10ML suspension Take 10 mLs (400 mg total) by mouth 2 (two) times daily. (Patient not taking: Reported on  11/22/2018)  . mirtazapine (REMERON) 7.5 MG tablet Take 7.5 mg by mouth at bedtime.   Marland Kitchen omeprazole (PRILOSEC) 20 MG capsule Take 20 mg by mouth 2 (two) times daily before a meal.   . ondansetron (ZOFRAN) 8 MG tablet Take 1 tablet (8 mg total) by mouth 2 (two) times daily as needed for refractory nausea / vomiting. Start on day 3 after chemotherapy.  Marland Kitchen oxyCODONE-acetaminophen (PERCOCET) 10-325 MG tablet Take 1 tablet by mouth every 6 (six) hours as needed for pain. (Patient not taking: Reported on 11/22/2018)  . Probiotic Product (PROBIOTIC DAILY PO) Take 1 capsule by mouth daily.  . prochlorperazine (COMPAZINE) 10 MG tablet Take 1 tablet (10 mg total) by mouth every 6 (six) hours as needed for nausea or vomiting.  . promethazine (PHENERGAN) 25 MG tablet Take 1 tablet (25 mg total) by mouth every 8 (eight) hours as needed for refractory nausea / vomiting.  . rivaroxaban (XARELTO) 20 MG TABS tablet Take 1 tablet (20 mg total) by mouth daily with supper.  . traZODone (DESYREL) 100 MG tablet Take 1 tablet (100 mg total) by mouth at bedtime.  . vitamin B-12 (CYANOCOBALAMIN) 1000 MCG tablet Take 1,000 mcg by mouth daily.   Facility-Administered Encounter Medications as of 11/25/2018  Medication  . sodium chloride 0.9 % injection 10 mL  . sodium chloride flush (NS) 0.9 % injection 10 mL    ALLERGIES:  Allergies  Allergen Reactions  . Oxycodone     Blisters, hallucinations  . Tramadol     Blisters, hallucinations  . Trazodone And Nefazodone Other (See Comments)    hallucinations     PHYSICAL EXAM:  ECOG Performance status: 3  Vitals:   11/25/18 1151  BP: 137/88  Pulse: (!) 103  Resp: 18  Temp: 98.7 F (37.1 C)  SpO2: 99%   Filed Weights   11/25/18 1151  Weight: 169 lb 9.6 oz (76.9 kg)    Physical Exam Constitutional:      Appearance: Normal appearance.  HENT:     Head: Normocephalic.  Nose: Nose normal.     Mouth/Throat:     Mouth: Mucous membranes are moist.      Pharynx: Oropharynx is clear.  Eyes:     Extraocular Movements: Extraocular movements intact.     Conjunctiva/sclera: Conjunctivae normal.  Neck:     Musculoskeletal: Normal range of motion.  Cardiovascular:     Rate and Rhythm: Regular rhythm. Tachycardia present.     Pulses: Normal pulses.     Heart sounds: Normal heart sounds.  Pulmonary:     Effort: Pulmonary effort is normal.     Breath sounds: Normal breath sounds.  Abdominal:     General: Abdomen is flat. Bowel sounds are normal.     Palpations: Abdomen is soft.  Musculoskeletal: Normal range of motion.  Skin:    General: Skin is warm and dry.  Neurological:     General: No focal deficit present.     Mental Status: He is alert and oriented to person, place, and time.  Psychiatric:        Mood and Affect: Mood normal.        Behavior: Behavior normal.        Thought Content: Thought content normal.        Judgment: Judgment normal.      LABORATORY DATA:  I have reviewed the labs as listed.  CBC    Component Value Date/Time   WBC 13.7 (H) 11/24/2018 0758   RBC 4.35 11/24/2018 0758   HGB 13.1 11/24/2018 0758   HGB 11.6 (L) 02/04/2011 1059   HCT 40.3 11/24/2018 0758   HCT 34.7 (L) 02/04/2011 1059   PLT 304 11/24/2018 0758   PLT 282 02/04/2011 1059   MCV 92.6 11/24/2018 0758   MCV 76.0 (L) 02/04/2011 1059   MCH 30.1 11/24/2018 0758   MCHC 32.5 11/24/2018 0758   RDW 14.5 11/24/2018 0758   RDW 18.5 (H) 02/04/2011 1059   LYMPHSABS 1.8 11/24/2018 0758   LYMPHSABS 1.1 02/04/2011 1059   MONOABS 0.8 11/24/2018 0758   MONOABS 0.5 02/04/2011 1059   EOSABS 0.2 11/24/2018 0758   EOSABS 0.1 02/04/2011 1059   BASOSABS 0.1 11/24/2018 0758   BASOSABS 0.1 02/04/2011 1059   CMP Latest Ref Rng & Units 11/24/2018 11/16/2018 11/08/2018  Glucose 70 - 99 mg/dL 265(H) 209(H) 132(H)  BUN 8 - 23 mg/dL '18 18 13  '$ Creatinine 0.61 - 1.24 mg/dL 0.73 0.70 0.61  Sodium 135 - 145 mmol/L 136 135 139  Potassium 3.5 - 5.1 mmol/L 3.9 4.3 3.5   Chloride 98 - 111 mmol/L 103 107 107  CO2 22 - 32 mmol/L 23 20(L) 25  Calcium 8.9 - 10.3 mg/dL 9.1 9.0 8.9  Total Protein 6.5 - 8.1 g/dL 6.5 6.6 6.2(L)  Total Bilirubin 0.3 - 1.2 mg/dL 1.3(H) 1.0 0.9  Alkaline Phos 38 - 126 U/L 105 111 104  AST 15 - 41 U/L '21 19 15  '$ ALT 0 - 44 U/L '25 26 14       '$ ASSESSMENT & PLAN:   Colon cancer (HCC) 1.  Stage IV rectal cancer with lung and pancreatic metastasis: Foundation 1 CDX shows KRAS G13D and NRAS wild type, MS-stable. -12 cycles of FOLFOX in the adjuvant setting from October 12 through 07/14/2011. -15 cycles of FOLFOX and bevacizumab from 07/08/2016 through 01/20/2017 followed by infusional 5-FU and bevacizumab until 07/14/2017. -Maintenance Xeloda from 08/07/2017 with bevacizumab through 08/19/2018 with progression. - ERCP and stent placement on 09/16/2018 showing single localized biliary stricture.  Biliary sphincterotomy  was performed with stricture dilated to 6 mm with balloon to facilitate stent placement.  1 covered metal stent was placed. -Flexible sigmoidoscopy on 09/24/2018 showing narrowing of the colorectal anastomosis.  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 - 2 cycles of FOLFIRI from 10/04/2018 through 10/25/2018 with 20% dose reduction during cycle 2. - He has developed severe tiredness after cycle 2 even after dose reduction. 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  Will consider dose reduction at next cycle. - Patient presented today with concerns of infection with fever.  Chest x-ray was performed and was negative for pneumonia.  Have recommended patient be started on Levaquin 500 mg daily for possible infectious process. -Patient will return next week treatment.   2.  Weight loss: -He gained 1 and half pounds in the last 2 days.  Mirtazapine and Marinol did not work. -He started taking Megace twice daily on 11/07/2018.  He is drinking 6 cans  of Ensure daily. - He is drinking about 6 ensures per day.   -He reports difficulty swallowing solid foods.  They are coming back up.  He reports that Dr. Buford Dresser did throat stretching a few years ago.  I will reach out to him to see if any procedure is helpful.   3.  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  He was diagnosed in 2016, status post IVC filter.  4.  Anxiety: -He is taking Klonopin 1 mg 3 times a day.  This is helping most days.      Orders placed this encounter:  Orders Placed This Encounter  Procedures  . DG Chest [place], [place] [phone]

## 2018-11-26 ENCOUNTER — Encounter (HOSPITAL_COMMUNITY): Payer: Medicare Other

## 2018-11-29 ENCOUNTER — Other Ambulatory Visit: Payer: Self-pay

## 2018-11-29 ENCOUNTER — Other Ambulatory Visit (HOSPITAL_COMMUNITY): Payer: Self-pay | Admitting: *Deleted

## 2018-11-29 ENCOUNTER — Telehealth (HOSPITAL_COMMUNITY): Payer: Self-pay | Admitting: *Deleted

## 2018-11-29 ENCOUNTER — Inpatient Hospital Stay (HOSPITAL_COMMUNITY): Payer: Medicare Other

## 2018-11-29 ENCOUNTER — Encounter (HOSPITAL_COMMUNITY): Payer: Self-pay

## 2018-11-29 VITALS — BP 141/80 | HR 88 | Temp 96.8°F | Resp 18

## 2018-11-29 DIAGNOSIS — C20 Malignant neoplasm of rectum: Secondary | ICD-10-CM | POA: Diagnosis not present

## 2018-11-29 DIAGNOSIS — G893 Neoplasm related pain (acute) (chronic): Secondary | ICD-10-CM | POA: Diagnosis not present

## 2018-11-29 DIAGNOSIS — R634 Abnormal weight loss: Secondary | ICD-10-CM | POA: Diagnosis not present

## 2018-11-29 DIAGNOSIS — C7801 Secondary malignant neoplasm of right lung: Secondary | ICD-10-CM | POA: Diagnosis not present

## 2018-11-29 DIAGNOSIS — E860 Dehydration: Secondary | ICD-10-CM

## 2018-11-29 DIAGNOSIS — C7889 Secondary malignant neoplasm of other digestive organs: Secondary | ICD-10-CM | POA: Diagnosis not present

## 2018-11-29 DIAGNOSIS — R112 Nausea with vomiting, unspecified: Secondary | ICD-10-CM

## 2018-11-29 DIAGNOSIS — Z5111 Encounter for antineoplastic chemotherapy: Secondary | ICD-10-CM | POA: Diagnosis not present

## 2018-11-29 LAB — CBC WITH DIFFERENTIAL/PLATELET
Abs Immature Granulocytes: 0.03 10*3/uL (ref 0.00–0.07)
Basophils Absolute: 0.1 10*3/uL (ref 0.0–0.1)
Basophils Relative: 1 %
Eosinophils Absolute: 0.2 10*3/uL (ref 0.0–0.5)
Eosinophils Relative: 2 %
HCT: 39.1 % (ref 39.0–52.0)
Hemoglobin: 12.7 g/dL — ABNORMAL LOW (ref 13.0–17.0)
Immature Granulocytes: 0 %
Lymphocytes Relative: 17 %
Lymphs Abs: 1.7 10*3/uL (ref 0.7–4.0)
MCH: 30 pg (ref 26.0–34.0)
MCHC: 32.5 g/dL (ref 30.0–36.0)
MCV: 92.4 fL (ref 80.0–100.0)
Monocytes Absolute: 0.5 10*3/uL (ref 0.1–1.0)
Monocytes Relative: 5 %
Neutro Abs: 7.7 10*3/uL (ref 1.7–7.7)
Neutrophils Relative %: 75 %
Platelets: 248 10*3/uL (ref 150–400)
RBC: 4.23 MIL/uL (ref 4.22–5.81)
RDW: 14.2 % (ref 11.5–15.5)
WBC: 10.1 10*3/uL (ref 4.0–10.5)
nRBC: 0 % (ref 0.0–0.2)

## 2018-11-29 LAB — COMPREHENSIVE METABOLIC PANEL
ALT: 26 U/L (ref 0–44)
AST: 25 U/L (ref 15–41)
Albumin: 3.1 g/dL — ABNORMAL LOW (ref 3.5–5.0)
Alkaline Phosphatase: 97 U/L (ref 38–126)
Anion gap: 10 (ref 5–15)
BUN: 21 mg/dL (ref 8–23)
CO2: 22 mmol/L (ref 22–32)
Calcium: 8.9 mg/dL (ref 8.9–10.3)
Chloride: 101 mmol/L (ref 98–111)
Creatinine, Ser: 0.73 mg/dL (ref 0.61–1.24)
GFR calc Af Amer: >60 mL/min (ref >60)
GFR calc non Af Amer: >60 mL/min (ref >60)
Glucose, Bld: 307 mg/dL — ABNORMAL HIGH (ref 70–99)
Potassium: 4.1 mmol/L (ref 3.5–5.1)
Sodium: 133 mmol/L — ABNORMAL LOW (ref 135–145)
Total Bilirubin: 1.1 mg/dL (ref 0.3–1.2)
Total Protein: 6.5 g/dL (ref 6.5–8.1)

## 2018-11-29 LAB — MAGNESIUM: Magnesium: 1.8 mg/dL (ref 1.7–2.4)

## 2018-11-29 MED ORDER — SODIUM CHLORIDE 0.9% FLUSH
10.0000 mL | Freq: Once | INTRAVENOUS | Status: AC | PRN
  Administered 2018-11-29: 10 mL

## 2018-11-29 MED ORDER — HEPARIN SOD (PORK) LOCK FLUSH 100 UNIT/ML IV SOLN
500.0000 [IU] | Freq: Once | INTRAVENOUS | Status: AC | PRN
  Administered 2018-11-29: 500 [IU]

## 2018-11-29 MED ORDER — SODIUM CHLORIDE 0.9 % IV SOLN
Freq: Once | INTRAVENOUS | Status: AC
  Administered 2018-11-29: 13:00:00 via INTRAVENOUS
  Filled 2018-11-29: qty 1000

## 2018-11-29 NOTE — Patient Instructions (Signed)
[place] at [facility] Discharge Instructions  Received IV hydration with magnesium and potassium today. Follow-up as scheduled. Call clinic for any questions or concerns   Thank you for choosing [place] at [facility] to provide your oncology and hematology care.  To afford each patient quality time with our provider, please arrive at least 15 minutes before your scheduled appointment time.   If you have a lab appointment with the [name] please come in thru the  Main Entrance and check in at the main information desk  You need to re-schedule your appointment should you arrive 10 or more minutes late.  We strive to give you quality time with our providers, and arriving late affects you and other patients whose appointments are after yours.  Also, if you no show three or more times for appointments you may be dismissed from the clinic at the providers discretion.     Again, thank you for choosing [facility].  Our hope is that these requests will decrease the amount of time that you wait before being seen by our physicians.       _____________________________________________________________  Should you have questions after your visit to [facility], please contact our office at [phone] between the hours of 8:00 a.m. and 4:30 p.m.  Voicemails left after 4:00 p.m. will not be returned until the following business day.  For prescription refill requests, have your pharmacy contact our office and allow 72 hours.    Cancer Center Support Programs:   > Cancer Support Group  2nd Tuesday of the month 1pm-2pm, Journey Room

## 2018-11-29 NOTE — Progress Notes (Signed)
1210 Labs, including Glucose of 307, reviewed with Dr. [name] and orders obtained for IV hydration with magnesium and potassium from supportive plan per MD       [name] tolerated IV hydration well without complaints or incident. VSS upon discharge. Pt discharged via wheelchair in satisfactory condition

## 2018-11-29 NOTE — Telephone Encounter (Addendum)
Charles [name]'s wife called stating that he has been having dizziness and balance issues for the last 2-3 days.  She is concerned it may be due to the Ensure he is drinking (6 cans a day as advised) causing his blood glucose to be elevated.  She has a machine to monitor his blood sugars at home, but states it is not working at this time.  Advised her to bring [name] to the clinic for lab work (CBC/diff, CMET, Mg++) and possible fluids.  She is agreeable to this.  Appointment made for PF/labs and possible IVF infusion.

## 2018-11-30 ENCOUNTER — Ambulatory Visit (HOSPITAL_COMMUNITY): Payer: Medicare Other | Admitting: Hematology

## 2018-11-30 ENCOUNTER — Ambulatory Visit (HOSPITAL_COMMUNITY): Payer: Medicare Other

## 2018-11-30 ENCOUNTER — Other Ambulatory Visit (HOSPITAL_COMMUNITY): Payer: Medicare Other

## 2018-11-30 DIAGNOSIS — E1151 Type 2 diabetes mellitus with diabetic peripheral angiopathy without gangrene: Secondary | ICD-10-CM | POA: Diagnosis not present

## 2018-11-30 DIAGNOSIS — G620 Drug-induced polyneuropathy: Secondary | ICD-10-CM | POA: Diagnosis not present

## 2018-11-30 DIAGNOSIS — K9189 Other postprocedural complications and disorders of digestive system: Secondary | ICD-10-CM | POA: Diagnosis not present

## 2018-11-30 DIAGNOSIS — T451X5S Adverse effect of antineoplastic and immunosuppressive drugs, sequela: Secondary | ICD-10-CM | POA: Diagnosis not present

## 2018-11-30 DIAGNOSIS — Z433 Encounter for attention to colostomy: Secondary | ICD-10-CM | POA: Diagnosis not present

## 2018-11-30 DIAGNOSIS — C250 Malignant neoplasm of head of pancreas: Secondary | ICD-10-CM | POA: Diagnosis not present

## 2018-12-01 ENCOUNTER — Other Ambulatory Visit: Payer: Self-pay

## 2018-12-01 ENCOUNTER — Encounter (HOSPITAL_COMMUNITY): Payer: Self-pay | Admitting: Hematology

## 2018-12-01 ENCOUNTER — Inpatient Hospital Stay (HOSPITAL_BASED_OUTPATIENT_CLINIC_OR_DEPARTMENT_OTHER): Payer: Medicare Other | Admitting: Hematology

## 2018-12-01 ENCOUNTER — Inpatient Hospital Stay (HOSPITAL_COMMUNITY): Payer: Medicare Other

## 2018-12-01 VITALS — BP 133/69 | HR 91 | Temp 97.9°F | Resp 18

## 2018-12-01 DIAGNOSIS — J029 Acute pharyngitis, unspecified: Secondary | ICD-10-CM | POA: Diagnosis not present

## 2018-12-01 DIAGNOSIS — Z79899 Other long term (current) drug therapy: Secondary | ICD-10-CM

## 2018-12-01 DIAGNOSIS — Z902 Acquired absence of lung [part of]: Secondary | ICD-10-CM | POA: Diagnosis not present

## 2018-12-01 DIAGNOSIS — T451X5S Adverse effect of antineoplastic and immunosuppressive drugs, sequela: Secondary | ICD-10-CM | POA: Diagnosis not present

## 2018-12-01 DIAGNOSIS — Z7984 Long term (current) use of oral hypoglycemic drugs: Secondary | ICD-10-CM | POA: Diagnosis not present

## 2018-12-01 DIAGNOSIS — C7889 Secondary malignant neoplasm of other digestive organs: Secondary | ICD-10-CM | POA: Diagnosis not present

## 2018-12-01 DIAGNOSIS — Z86718 Personal history of other venous thrombosis and embolism: Secondary | ICD-10-CM | POA: Diagnosis not present

## 2018-12-01 DIAGNOSIS — Z86711 Personal history of pulmonary embolism: Secondary | ICD-10-CM | POA: Diagnosis not present

## 2018-12-01 DIAGNOSIS — Z5181 Encounter for therapeutic drug level monitoring: Secondary | ICD-10-CM | POA: Diagnosis not present

## 2018-12-01 DIAGNOSIS — C20 Malignant neoplasm of rectum: Secondary | ICD-10-CM

## 2018-12-01 DIAGNOSIS — F419 Anxiety disorder, unspecified: Secondary | ICD-10-CM | POA: Diagnosis not present

## 2018-12-01 DIAGNOSIS — G620 Drug-induced polyneuropathy: Secondary | ICD-10-CM | POA: Diagnosis not present

## 2018-12-01 DIAGNOSIS — K9189 Other postprocedural complications and disorders of digestive system: Secondary | ICD-10-CM | POA: Diagnosis not present

## 2018-12-01 DIAGNOSIS — G893 Neoplasm related pain (acute) (chronic): Secondary | ICD-10-CM | POA: Diagnosis not present

## 2018-12-01 DIAGNOSIS — Z5111 Encounter for antineoplastic chemotherapy: Secondary | ICD-10-CM | POA: Diagnosis not present

## 2018-12-01 DIAGNOSIS — C7801 Secondary malignant neoplasm of right lung: Secondary | ICD-10-CM

## 2018-12-01 DIAGNOSIS — Z85048 Personal history of other malignant neoplasm of rectum, rectosigmoid junction, and anus: Secondary | ICD-10-CM | POA: Diagnosis not present

## 2018-12-01 DIAGNOSIS — C7800 Secondary malignant neoplasm of unspecified lung: Secondary | ICD-10-CM | POA: Diagnosis not present

## 2018-12-01 DIAGNOSIS — Z7901 Long term (current) use of anticoagulants: Secondary | ICD-10-CM | POA: Diagnosis not present

## 2018-12-01 DIAGNOSIS — Z933 Colostomy status: Secondary | ICD-10-CM | POA: Diagnosis not present

## 2018-12-01 DIAGNOSIS — R634 Abnormal weight loss: Secondary | ICD-10-CM

## 2018-12-01 DIAGNOSIS — E1151 Type 2 diabetes mellitus with diabetic peripheral angiopathy without gangrene: Secondary | ICD-10-CM | POA: Diagnosis not present

## 2018-12-01 DIAGNOSIS — Z432 Encounter for attention to ileostomy: Secondary | ICD-10-CM | POA: Diagnosis not present

## 2018-12-01 DIAGNOSIS — Z95828 Presence of other vascular implants and grafts: Secondary | ICD-10-CM | POA: Diagnosis not present

## 2018-12-01 DIAGNOSIS — Z85118 Personal history of other malignant neoplasm of bronchus and lung: Secondary | ICD-10-CM | POA: Diagnosis not present

## 2018-12-01 LAB — URINALYSIS, DIPSTICK ONLY
Bacteria, UA: NONE SEEN
Bilirubin Urine: NEGATIVE
Glucose, UA: NEGATIVE mg/dL
Hgb urine dipstick: NEGATIVE
Ketones, ur: NEGATIVE mg/dL
Leukocytes,Ua: NEGATIVE
Nitrite: NEGATIVE
Protein, ur: 30 mg/dL — AB
Specific Gravity, Urine: 1.023 (ref 1.005–1.030)
pH: 5 (ref 5.0–8.0)

## 2018-12-01 LAB — CBC WITH DIFFERENTIAL/PLATELET
Abs Immature Granulocytes: 0.03 10*3/uL (ref 0.00–0.07)
Basophils Absolute: 0.1 10*3/uL (ref 0.0–0.1)
Basophils Relative: 1 %
Eosinophils Absolute: 0.3 10*3/uL (ref 0.0–0.5)
Eosinophils Relative: 3 %
HCT: 39.4 % (ref 39.0–52.0)
Hemoglobin: 12.7 g/dL — ABNORMAL LOW (ref 13.0–17.0)
Immature Granulocytes: 0 %
Lymphocytes Relative: 18 %
Lymphs Abs: 1.8 10*3/uL (ref 0.7–4.0)
MCH: 29.6 pg (ref 26.0–34.0)
MCHC: 32.2 g/dL (ref 30.0–36.0)
MCV: 91.8 fL (ref 80.0–100.0)
Monocytes Absolute: 0.5 10*3/uL (ref 0.1–1.0)
Monocytes Relative: 5 %
Neutro Abs: 7.2 10*3/uL (ref 1.7–7.7)
Neutrophils Relative %: 73 %
Platelets: 246 10*3/uL (ref 150–400)
RBC: 4.29 MIL/uL (ref 4.22–5.81)
RDW: 14.2 % (ref 11.5–15.5)
WBC: 9.9 10*3/uL (ref 4.0–10.5)
nRBC: 0 % (ref 0.0–0.2)

## 2018-12-01 LAB — COMPREHENSIVE METABOLIC PANEL
ALT: 29 U/L (ref 0–44)
AST: 21 U/L (ref 15–41)
Albumin: 3.4 g/dL — ABNORMAL LOW (ref 3.5–5.0)
Alkaline Phosphatase: 95 U/L (ref 38–126)
Anion gap: 7 (ref 5–15)
BUN: 16 mg/dL (ref 8–23)
CO2: 24 mmol/L (ref 22–32)
Calcium: 9 mg/dL (ref 8.9–10.3)
Chloride: 105 mmol/L (ref 98–111)
Creatinine, Ser: 0.63 mg/dL (ref 0.61–1.24)
GFR calc Af Amer: >60 mL/min (ref >60)
GFR calc non Af Amer: >60 mL/min (ref >60)
Glucose, Bld: 151 mg/dL — ABNORMAL HIGH (ref 70–99)
Potassium: 4.1 mmol/L (ref 3.5–5.1)
Sodium: 136 mmol/L (ref 135–145)
Total Bilirubin: 1.2 mg/dL (ref 0.3–1.2)
Total Protein: 6.5 g/dL (ref 6.5–8.1)

## 2018-12-01 LAB — MAGNESIUM: Magnesium: 1.8 mg/dL (ref 1.7–2.4)

## 2018-12-01 MED ORDER — SODIUM CHLORIDE 0.9 % IV SOLN
400.0000 mg | Freq: Once | INTRAVENOUS | Status: AC
  Administered 2018-12-01: 11:00:00 400 mg via INTRAVENOUS
  Filled 2018-12-01: qty 16

## 2018-12-01 MED ORDER — ATROPINE SULFATE 1 MG/ML IJ SOLN
0.5000 mg | Freq: Once | INTRAMUSCULAR | Status: AC | PRN
  Administered 2018-12-01: 0.5 mg via INTRAVENOUS
  Filled 2018-12-01: qty 1

## 2018-12-01 MED ORDER — SODIUM CHLORIDE 0.9% FLUSH: 10.0000 mL | INTRAVENOUS | Status: DC | PRN

## 2018-12-01 MED ORDER — PALONOSETRON HCL INJECTION 0.25 MG/5ML
0.2500 mg | Freq: Once | INTRAVENOUS | Status: AC
  Administered 2018-12-01: 0.25 mg via INTRAVENOUS
  Filled 2018-12-01: qty 5

## 2018-12-01 MED ORDER — SODIUM CHLORIDE 0.9 % IV SOLN
Freq: Once | INTRAVENOUS | Status: AC
  Administered 2018-12-01: 10:00:00 via INTRAVENOUS

## 2018-12-01 MED ORDER — SODIUM CHLORIDE 0.9 % IV SOLN
1920.0000 mg/m2 | INTRAVENOUS | Status: DC
  Administered 2018-12-01: 13:00:00 4100 mg via INTRAVENOUS
  Filled 2018-12-01: qty 82

## 2018-12-01 MED ORDER — SODIUM CHLORIDE 0.9 % IV SOLN
90.0000 mg/m2 | Freq: Once | INTRAVENOUS | Status: AC
  Administered 2018-12-01: 11:00:00 200 mg via INTRAVENOUS
  Filled 2018-12-01: qty 10

## 2018-12-01 MED ORDER — SODIUM CHLORIDE 0.9 % IV SOLN
Freq: Once | INTRAVENOUS | Status: AC
  Administered 2018-12-01: 10:00:00 via INTRAVENOUS
  Filled 2018-12-01: qty 5

## 2018-12-01 MED ORDER — SODIUM CHLORIDE 0.9 % IV SOLN
327.0000 mg/m2 | Freq: Once | INTRAVENOUS | Status: AC
  Administered 2018-12-01: 11:00:00 700 mg via INTRAVENOUS
  Filled 2018-12-01: qty 35

## 2018-12-01 MED ORDER — FLUOROURACIL CHEMO INJECTION 2.5 GM/50ML
320.0000 mg/m2 | Freq: Once | INTRAVENOUS | Status: AC
  Administered 2018-12-01: 13:00:00 700 mg via INTRAVENOUS
  Filled 2018-12-01: qty 14

## 2018-12-01 NOTE — Patient Instructions (Addendum)
[facility] at [facility] Discharge Instructions  You were seen today by Dr. Katragadda. He went over your recent lab results. He will see you back in 1 week for labs and follow up.   Thank you for choosing [facility] at [facility] to provide your oncology and hematology care.  To afford each patient quality time with our provider, please arrive at least 15 minutes before your scheduled appointment time.   If you have a lab appointment with the Cancer Center please come in thru the  Main Entrance and check in at the main information desk  You need to re-schedule your appointment should you arrive 10 or more minutes late.  We strive to give you quality time with our providers, and arriving late affects you and other patients whose appointments are after yours.  Also, if you no show three or more times for appointments you may be dismissed from the clinic at the providers discretion.     Again, thank you for choosing [facility].  Our hope is that these requests will decrease the amount of time that you wait before being seen by our physicians.       _____________________________________________________________  Should you have questions after your visit to  Cancer Center, please contact our office at [phone] between the hours of 8:00 a.m. and 4:30 p.m.  Voicemails left after 4:00 p.m. will not be returned until the following business day.  For prescription refill requests, have your pharmacy contact our office and allow 72 hours.    Cancer Center Support Programs:   > Cancer Support Group  2nd Tuesday of the month 1pm-2pm, Journey Room    

## 2018-12-01 NOTE — Assessment & Plan Note (Signed)
1.  Stage IV rectal cancer with lung and pancreatic metastasis: Foundation 1 CDX shows KRAS G13D and NRAS wild type, MS-stable. -12 cycles of FOLFOX in the adjuvant setting from October 12 through 07/14/2011. -15 cycles of FOLFOX and bevacizumab from 07/08/2016 through 01/20/2017 followed by infusional 5-FU and bevacizumab until 07/14/2017. -Maintenance Xeloda from 08/07/2017 with bevacizumab through 08/19/2018 with progression. - ERCP and stent placement on 09/16/2018 showing single localized biliary stricture.  Biliary sphincterotomy was performed with stricture dilated to 6 mm with balloon to facilitate stent placement.  1 covered metal stent was placed. -Flexible sigmoidoscopy on 09/24/2018 showing narrowing of the colorectal anastomosis.  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 - 2 cycles of FOLFIRI from 10/04/2018 through 10/25/2018 with 20% dose reduction during cycle 2. - He has developed severe tiredness after cycle 2 even after dose reduction. 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  - Today he looked much better in terms of appetite and energy.  He was reportedly eating better.  We have reviewed his blood work. - We will proceed with cycle 3 of FOLFIRI.  I will dose reduce irinotecan by 50%.  2.  Weight loss: -He did not lose any significant weight.  Mirtazapine and Marinol did not work. -He is taking Megace twice daily started on 11/07/2018. -She is able to eat solid foods.  Dysphagia has improved.  No need for GI consult at this time.  His blood sugars have gone up since he started drinking Ensure. -We have switched him to Glucerna. -He takes glimepiride 2 mg twice daily.  Fasting fingersticks are ranging between 129 and 156.  3.  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  He was diagnosed in 2016, status post IVC filter.  4.  Anxiety: -He is taking Klonopin 1 mg 3 times a day.  This is helping most  days.

## 2018-12-01 NOTE — Patient Instructions (Signed)
[facility] Discharge Instructions for Patients Receiving Chemotherapy  Today you received the following chemotherapy agents   To help prevent nausea and vomiting after your treatment, we encourage you to take your nausea medication   If you develop nausea and vomiting that is not controlled by your nausea medication, call the clinic.   BELOW ARE SYMPTOMS THAT SHOULD BE REPORTED IMMEDIATELY:  *FEVER GREATER THAN 100.5 F  *CHILLS WITH OR WITHOUT FEVER  NAUSEA AND VOMITING THAT IS NOT CONTROLLED WITH YOUR NAUSEA MEDICATION  *UNUSUAL SHORTNESS OF BREATH  *UNUSUAL BRUISING OR BLEEDING  TENDERNESS IN MOUTH AND THROAT WITH OR WITHOUT PRESENCE OF ULCERS  *URINARY PROBLEMS  *BOWEL PROBLEMS  UNUSUAL RASH Items with * indicate a potential emergency and should be followed up as soon as possible.  Feel free to call the clinic should you have any questions or concerns. The clinic phone number is [phone].  Please show the CHEMO ALERT CARD at check-in to the Emergency Department and triage nurse.   

## 2018-12-01 NOTE — Progress Notes (Signed)
Pt presents today for treatment and f/u visit. HR elevated at 106. Remaining VS within parameter for treatment. Pt has no complaints of any significant changes since the last visit. Pt rates R chest pain a 6 which has been ongoing. Message sent to [facility], to request help with Diabetic Ensure supplementation. Labs pending.   Message received from [facility] LPN. Proceed with treatment today. Dose reduction today. Labs reviewed. MD aware of HR 106.   Treatment given today per MD orders. Tolerated infusion without adverse affects. Vital signs stable. No complaints at this time. 5FU pump infusing per protocol. RUN noted on the Left of screen.  Discharged from clinic ambulatory. F/U with [facility] as scheduled.

## 2018-12-01 NOTE — Progress Notes (Signed)
Charles [name], [place] [address]   CLINIC:  Medical Oncology/Hematology  PCP:  [name], MD [place] [address] [phone]   REASON FOR VISIT:  Follow-up for Stage IV Colon Cancer  CURRENT THERAPY: FOLFIRI  BRIEF ONCOLOGIC HISTORY:  Oncology History  Rectal cancer ([place])  07/24/2010 Initial Diagnosis   Invasive adenocarcinoma of rectum   09/09/2010 Concurrent Chemotherapy   S/P radiation and concurrent 5-FU continuous infusion from 09/09/10- 10/10/10.   11/14/2010 Surgery   S/P proctectomy with colorectal anastomosis and diverting loop ileostomy on 11/14/10 at [facility] by Dr. Harlon Ditty. Pathology reveals a pT3b N1 with 3/20 lymph nodes.   02/05/2011 - 07/14/2011 Chemotherapy   FOLFOX   08/18/2011 Surgery   Approximate date of surgery- [place] by Dr. Harlon Ditty    Remission     11/24/2013 Survivorship   Colonoscopy- somewhat fibrotic/friable anastomotic mucosal-status post biopsy (narrowing not felt to be clinically significant).  Negative pathology for malignancy   09/12/2014 Imaging   DVT in the left femoral venous system, left common iliac vein, IVC, and within the IVC filter Right lower extremity venography confirms chronic occlusion of the femoral venous system with collateralization. The right iliac venous system is patent and do   09/25/2014 PET scan   The right middle lobe pulmonary nodule is hypermetabolic, favored to represent a primary bronchogenic carcinoma.Equivocal mediastinal nodes, similar to surrounding blood pool. Bilateral adrenal hypermetabolism, felt to be physiologic   10/24/2014 Pathology Results   Lung, needle/core biopsy(ies), RML - ADENOCARCINOMA, SEE COMMENT metastatic adenocarcinoma of a colorectal primary   10/24/2014 Relapse/Recurrence     11/16/2014 Definitive Surgery   Bronchoscopy, right video-assisted thoracoscopy, wedge resection of right middle lobe by Dr. Servando Snare   11/16/2014  Pathology Results   Lung, wedge biopsy/resection, right lingula and small portion of middle lobe - METASTATIC ADENOCARCINOMA, CONSISTENT WITH COLORECTAL PRIMARY, SPANNING 2.0 CM. - THE SURGICAL RESECTION MARGINS ARE NEGATIVE FOR ADENOCARCINOMA.   11/16/2014 Remission   THE SURGICAL RESECTION MARGINS ARE NEGATIVE FOR ADENOCARCINOMA.   03/07/2015 Imaging   CT CAP- Interval resection of right middle lobe metastasis. No acute process or evidence of metastatic disease in the chest, abdomen or pelvis. Improved right upper lobe reticular nodular opacities are favored to represent resolving infection.   09/14/2015 Imaging   CT CAP- No findings of recurrent malignancy. No recurrence along the wedge resection site of the right middle lobe. Right anterior abdominal wall focal hernia containing a knuckle of small bowel without complicating feature.   06/13/2016 Imaging   CT CAP- 1. New right-sided pleural metastasis/mass. Other smaller right-sided pulmonary nodules which are all pleural-based and most likely represent pleural metastasis. 2. No convincing evidence of abdominopelvic nodal metastasis. 3. Constellation of findings, including pancreatic atrophy, duct dilatation, and pancreatic head soft tissue fullness which are highly suspicious for pancreatic adenocarcinoma. Metastatic disease felt much less likely. Consider endoscopic ultrasound sampling or ERCP. Cannot exclude superimposed acute pancreatitis. 4. New enlargement of the appendiceal tip with subtle surrounding edema. Cannot exclude early or mild appendicitis. 5. Coronary artery atherosclerosis. Aortic atherosclerosis. 6. 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   06/13/2016 Progression   CT scan demonstrates progression of disease   06/19/2016 Procedure   EUS with FNA by Dr. Ardis Hughs   06/20/2016 Pathology Results   FINE NEEDLE ASPIRATION, ENDOSCOPIC, PANCREAS UNCINATE AREA(SPECIMEN 1 OF 1 COLLECTED 06/19/16):  MALIGNANT CELLS CONSISTENT WITH METASTATIC ADENOCARCINOMA.  06/25/2016 PET scan   1. Two pleural-based nodules in the right hemithorax are hypermetabolic. Metastatic disease is a distinct consideration. The scattered pulmonary parenchymal nodule seen on previous diagnostic CT imaging are below the threshold for reliable resolution on PET imaging. 2. Hypermetabolic lesion pancreatic head, consistent with neoplasm. As noted on prior CT, adenocarcinoma is any consideration. No evidence for hypermetabolic abdominal lymphadenopathy. Mottled uptake noted in the liver, but no discrete hepatic metastases are evident on PET imaging. 3. Appendix remains distended up to 0.9-10 mm diameter with a stone towards the tip in subtle periappendiceal edema/inflammation. The appendix is hypermetabolic along its length. Imaging features are relatively stable in the 12 day interval since prior CT scan. While appendicitis is a consideration, the relative stability over 12 days would be unusual for that etiology. Continued close follow-up recommended.   06/26/2016 Pathology Results   Not enough tissue for foundationONE or K-ras testing.   07/07/2016 Procedure   Port placed by Dr. Arnoldo Morale   07/08/2016 -  Chemotherapy   The patient had pegfilgrastim (NEULASTA) injection 6 mg, 6 mg, Subcutaneous,  Once, 0 of 4 cycles  ondansetron (ZOFRAN) IVPB 8 mg, 8 mg, Intravenous,  Once, 0 of 4 cycles  leucovorin 804 mg in dextrose 5 % 250 mL infusion, 400 mg/m2, Intravenous,  Once, 0 of 4 cycles  oxaliplatin (ELOXATIN) 170 mg in dextrose 5 % 500 mL chemo infusion, 85 mg/m2, Intravenous,  Once, 0 of 4 cycles  fluorouracil (ADRUCIL) 4,800 mg in sodium chloride 0.9 % 150 mL chemo infusion, 2,400 mg/m2 = 4,800 mg, Intravenous, 1 Day/Dose, 0 of 4 cycles  fluorouracil (ADRUCIL) chemo injection 800 mg, 400 mg/m2, Intravenous,  Once, 0 of 4 cycles  pegfilgrastim (NEULASTA) injection 6 mg, 6 mg, Subcutaneous,  Once, 2 of 2  cycles  ondansetron (ZOFRAN) IVPB 8 mg, 8 mg, Intravenous,  Once, 12 of 12 cycles  leucovorin 660 mg in dextrose 5 % 250 mL infusion, 660 mg (original dose ), Intravenous,  Once, 12 of 12 cycles Dose modification: 660 mg (Cycle 1)  oxaliplatin (ELOXATIN) 140 mg in dextrose 5 % 500 mL chemo infusion, 140 mg (original dose ), Intravenous,  Once, 12 of 12 cycles Dose modification: 140 mg (Cycle 1), 119 mg (85 % of original dose 140 mg, Cycle 8, Reason: Provider Judgment)  fluorouracil (ADRUCIL) 3,850 mg in sodium chloride 0.9 % 150 mL chemo infusion, 3,850 mg (original dose ), Intravenous, 1 Day/Dose, 12 of 12 cycles Dose modification: 3,850 mg (Cycle 1)  fluorouracil (ADRUCIL) chemo injection 700 mg, 700 mg (original dose ), Intravenous,  Once, 12 of 12 cycles Dose modification: 700 mg (Cycle 1)  palonosetron (ALOXI) injection 0.25 mg, 0.25 mg, Intravenous,  Once, 7 of 12 cycles Administration: 0.25 mg (09/30/2016)  bevacizumab (AVASTIN) 475 mg in sodium chloride 0.9 % 100 mL chemo infusion, 5 mg/kg = 475 mg, Intravenous,  Once, 6 of 11 cycles Administration: 500 mg (09/30/2016)  leucovorin 880 mg in dextrose 5 % 250 mL infusion, 400 mg/m2 = 880 mg, Intravenous,  Once, 7 of 12 cycles Administration: 880 mg (09/30/2016)  oxaliplatin (ELOXATIN) 185 mg in dextrose 5 % 500 mL chemo infusion, 85 mg/m2 = 185 mg, Intravenous,  Once, 7 of 12 cycles Administration: 185 mg (09/30/2016)  fluorouracil (ADRUCIL) chemo injection 900 mg, 400 mg/m2 = 900 mg, Intravenous,  Once, 7 of 12 cycles Administration: 900 mg (09/30/2016)  fluorouracil (ADRUCIL) 5,300 mg in sodium chloride 0.9 % 144 mL chemo infusion, 2,400 mg/m2 = 5,300  mg, Intravenous, 1 Day/Dose, 7 of 12 cycles Administration: 5,300 mg (09/30/2016)  for chemotherapy treatment.     08/27/2016 Imaging   CT CAP- 1. Pulmonary metastatic disease is stable. 2. Pancreatic head mass, grossly stable, with progressive atrophy of the body and tail of the  pancreas. Stable peripancreatic lymph node. 3. Mild circumferential rectal wall thickening, stable. 4. Aortic atherosclerosis (ICD10-170.0). Coronary artery calcification. 5. Hyperattenuating lesion off the lower pole left kidney, stable, too small to characterize. Continued attention on followup exams is warranted. 6. Persistent wall thickening and mild dilatation of the proximal appendix, of uncertain etiology. Continued attention on followup exams is warranted.   11/06/2016 Imaging   CT C/A/P: IMPRESSION: 1. Stable pulmonary metastatic disease. 2. Similar appearing pancreatic head mass. Progressed atrophy of the pancreatic body and tail. Stable peripancreatic lymph node. 3. Stable hypoattenuating lesion partially exophytic off the inferior pole of the left kidney. 4. Aortic atherosclerosis. 5. Persistent mild wall thickening and mild dilatation of the appendix.    01/30/2017 Imaging   Ct C/A/P: IMPRESSION: 1. Stable pulmonary metastatic disease. 2. Stable to slight increase in size of pancreatic head mass. 3.  Aortic Atherosclerosis (ICD10-I70.0). 4. Similar appearance of mild wall thickening of the appendix which contains an appendicolith.   05/01/2017 Imaging   CT C/A/P: Stable disease in the lungs, no progression of disease in the abdomen.   10/04/2018 -  Chemotherapy   The patient had palonosetron (ALOXI) injection 0.25 mg, 0.25 mg, Intravenous,  Once, 3 of 4 cycles Administration: 0.25 mg (10/04/2018), 0.25 mg (10/25/2018), 0.25 mg (12/01/2018) pegfilgrastim (NEULASTA) injection 6 mg, 6 mg, Subcutaneous, Once, 2 of 3 cycles Administration: 6 mg (10/27/2018) bevacizumab (AVASTIN) 450 mg in sodium chloride 0.9 % 100 mL chemo infusion, 5 mg/kg = 450 mg, Intravenous,  Once, 2 of 3 cycles Administration: 400 mg (12/01/2018) irinotecan (CAMPTOSAR) 380 mg in sodium chloride 0.9 % 500 mL chemo infusion, 180 mg/m2 = 380 mg, Intravenous,  Once, 3 of 4 cycles Dose modification: 144  mg/m2 (original dose 180 mg/m2, Cycle 2, Reason: Provider Judgment, Comment: 20% dose reduction), 90 mg/m2 (50 % of original dose 180 mg/m2, Cycle 3, Reason: Other (see comments), Comment: [id number]) Administration: 380 mg (10/04/2018), 300 mg (10/25/2018), 200 mg (12/01/2018) leucovorin 900 mg in sodium chloride 0.9 % 250 mL infusion, 421 mg/m2 = 856 mg, Intravenous,  Once, 3 of 4 cycles Dose modification: 320 mg/m2 (original dose 400 mg/m2, Cycle 2, Reason: Provider Judgment, Comment: 20% dose reduction) Administration: 900 mg (10/04/2018), 700 mg (10/25/2018), 700 mg (12/01/2018) fluorouracil (ADRUCIL) chemo injection 850 mg, 400 mg/m2 = 850 mg, Intravenous,  Once, 3 of 4 cycles Dose modification: 320 mg/m2 (original dose 400 mg/m2, Cycle 2, Reason: Provider Judgment, Comment: 20% dose reduction) Administration: 850 mg (10/04/2018), 700 mg (10/25/2018), 700 mg (12/01/2018) fosaprepitant (EMEND) 150 mg, dexamethasone (DECADRON) 12 mg in sodium chloride 0.9 % 145 mL IVPB, , Intravenous,  Once, 1 of 2 cycles Administration:  (12/01/2018) fluorouracil (ADRUCIL) 5,000 mg in sodium chloride 0.9 % 150 mL chemo infusion, 2,325 mg/m2 = 5,150 mg, Intravenous, 1 Day/Dose, 3 of 4 cycles Dose modification: 1,920 mg/m2 (original dose 2,400 mg/m2, Cycle 2, Reason: Provider Judgment, Comment: 20% dose reduction) Administration: 5,000 mg (10/04/2018), 4,100 mg (10/25/2018), 4,100 mg (12/01/2018)  for chemotherapy treatment.       CANCER STAGING: Cancer Staging Rectal cancer ([place]) Staging form: Colon and Rectum, AJCC 7th Edition - Clinical: Stage IIIB (T3, N1, M0) - Signed by [name], MD on  02/04/2011 - Pathologic stage from 11/17/2014: Stage IVA (M1a) - Signed by Baird Cancer, PA-C on 09/14/2015    INTERVAL HISTORY:  Charles [name] 68 y.o. male seen for follow-up and next cycle of chemotherapy.  He feels much better in terms of appetite and energy.  He is able to eat soft foods.  Appetite is 50%.  Energy  levels are 50%.  He is drinking ensures, however his blood sugars went up.  He is currently drinking Glucerna.  He did not lose any weight.  Denies any fevers or chills.  Cough has been stable.  He will finish antibiotic today.  Chest x-ray at last visit was negative.  REVIEW OF SYSTEMS:  Review of Systems  Psychiatric/Behavioral: The patient is nervous/anxious.   All other systems reviewed and are negative.    PAST MEDICAL/SURGICAL HISTORY:  Past Medical History:  Diagnosis Date  . Chronic anticoagulation   . Colon cancer ([place]) 07/24/2010   rectal ca, inv adenocarcinoma  . Depression 04/01/2011  . Diabetes mellitus without complication ([place])   . DVT (deep venous thrombosis) ([place]) 05/09/2011  . GERD (gastroesophageal reflux disease)   . High output ileostomy ([place]) 05/21/2011  . History of kidney stones   . HTN (hypertension)   . Hx of radiation therapy 09/02/10 to 10/14/10   pelvis  . Lung metastasis ([place])   . Neuropathy   . Peripheral vascular disease ([place])    dvt's,pe  . Pneumonia    hx x3  . Pulmonary embolism ([place])   . Rectal cancer ([place]) 01/15/2011   S/P radiation and concurrent 5-FU continuous infusion from 09/09/10- 10/10/10.  S/P proctectomy with colorectal anastomosis and diverting loop ileostomy on 11/14/10 at [facility] by Dr. Harlon Ditty. Pathology reveals a pT3b N1 with 3/20 lymph nodes.     Past Surgical History:  Procedure Laterality Date  . BILIARY STENT PLACEMENT N/A 09/16/2018   Procedure: STENT PLACEMENT;  Surgeon: Rogene Houston, MD;  [facility];  Service: Endoscopy;  Laterality: N/A;  . BIOPSY  09/24/2018   Procedure: BIOPSY;  Surgeon: Danie Binder, MD;  [facility];  Service: Endoscopy;;  . COLON SURGERY  11/14/2010   proctectomy with colorectal anastomosis and diverting loop ileostomy (temporary planned)  . COLONOSCOPY  07/2010   proximal rectal apple core mass 10-14cm from anal verge (adenocarcinoma), 2-3cm distal rectal carpet polyp  s/p piecemeal snare polypectomy (adenoma)  . COLONOSCOPY  04/21/2012   RMR: Friable,fibrotic appearing colorectal anastomosis producing some luminal narrowing-not felt to be critical. path: focal erosion with slight inflammation and hyperemia. SURVEILLANCE DUE DEC 2015  . COLONOSCOPY N/A 11/24/2013   Dr. Rourk:somewhat fibrotic/friable anastomotic mucosal-status post biopsy (narrowing not felt to be clinically significant) Single colonic diverticulum. benign polypoid rectal mucosa  . colostomy reversal  april 2013  . ERCP N/A 09/16/2018   Procedure: ENDOSCOPIC RETROGRADE CHOLANGIOPANCREATOGRAPHY (ERCP);  Surgeon: Rogene Houston, MD;  [facility];  Service: Endoscopy;  Laterality: N/A;  . ESOPHAGOGASTRODUODENOSCOPY  07/2010   RMR: schatki ring s/p dilation, small hh, SB bx benign  . ESOPHAGOGASTRODUODENOSCOPY N/A 09/04/2015   Procedure: ESOPHAGOGASTRODUODENOSCOPY (EGD);  Surgeon: Daneil Dolin, MD;  [facility];  Service: Endoscopy;  Laterality: N/A;  730  . EUS  08/2010   Dr. Owens Loffler. uT3N0 circumferential, nearly obstruction rectosigmoid adenocarcinoma, distal edge 12cm from anal verge  . EUS N/A 06/19/2016   Procedure: UPPER ENDOSCOPIC ULTRASOUND (EUS) RADIAL;  Surgeon: Milus Banister, MD;  Location:  [facility];  Service: Endoscopy;  Laterality: N/A;  . FLEXIBLE SIGMOIDOSCOPY N/A 09/24/2018   Procedure: FLEXIBLE SIGMOIDOSCOPY;  Surgeon: Danie Binder, MD;  [facility];  Service: Endoscopy;  Laterality: N/A;  . HERNIA REPAIR     abd hernia repair  . IVC filter    . ivc filter    . port a cath placement    . PORT-A-CATH REMOVAL  09/24/2011   Procedure: REMOVAL PORT-A-CATH;  Surgeon: Donato Heinz, MD;  Location: [facility];  Service: General;  Laterality: N/A;  Minor Room  . PORTACATH PLACEMENT Right 07/07/2016   Procedure: INSERTION PORT-A-CATH;  Surgeon: Aviva Signs, MD;  Location: [facility];  Service: General;  Laterality: Right;  . SPHINCTEROTOMY N/A  09/16/2018   Procedure: SPHINCTEROTOMY with balloon dialation;  Surgeon: Rogene Houston, MD;  [facility];  Service: Endoscopy;  Laterality: N/A;  . TRANSVERSE LOOP COLOSTOMY N/A 09/29/2018   Procedure: TRANSVERSE LOOP COLOSTOMY;  Surgeon: Aviva Signs, MD;  Location: [facility];  Service: General;  Laterality: N/A;  . VIDEO ASSISTED THORACOSCOPY (VATS)/WEDGE RESECTION Right 11/15/2014   Procedure: VIDEO ASSISTED THORACOSCOPY (VATS)/LUNG RESECTION WITH RIGHT LINGULECTOMY;  Surgeon: [name], MD;  Location: [facility];  Service: Thoracic;  Laterality: Right;  Marland Kitchen VIDEO BRONCHOSCOPY N/A 11/15/2014   Procedure: VIDEO BRONCHOSCOPY;  Surgeon: [name], MD;  Location: [facility] OR;  Service: Thoracic;  Laterality: N/A;     SOCIAL HISTORY:  Social History   Socioeconomic History  . Marital status: Married    Spouse name: Not on file  . Number of children: 2  . Years of education: Not on file  . Highest education level: Not on file  Occupational History  . Occupation: self-employed Regulatory affairs officer, currently not working    Fish farm manager: SELF EMPLOYED  Social Needs  . Financial resource strain: Not very hard  . Food insecurity    Worry: Never true    Inability: Never true  . Transportation needs    Medical: No    Non-medical: No  Tobacco Use  . Smoking status: Never Smoker  . Smokeless tobacco: Never Used  Substance and Sexual Activity  . Alcohol use: No  . Drug use: No  . Sexual activity: Yes    Birth control/protection: None    Comment: married  Lifestyle  . Physical activity    Days per week: 0 days    Minutes per session: 0 min  . Stress: Only a little  Relationships  . Social connections    Talks on phone: More than three times a week    Gets together: Once a week    Attends religious service: Patient refused    Active member of club or organization: Patient refused    Attends meetings of clubs or organizations: Patient refused    Relationship  status: Patient refused  . Intimate partner violence    Fear of current or ex partner: Patient refused    Emotionally abused: Patient refused    Physically abused: Patient refused    Forced sexual activity: Patient refused  Other Topics Concern  . Not on file  Social History Narrative  . Not on file    FAMILY HISTORY:  Family History  Problem Relation Age of Onset  . Cancer Brother        throat  . Cancer Brother        prostate  . Colon cancer Neg Hx   . Liver disease Neg Hx   . Inflammatory  bowel disease Neg Hx     CURRENT MEDICATIONS:  Outpatient Encounter Medications as of 12/01/2018  Medication Sig  . acetaminophen (TYLENOL) 500 MG tablet Take 1,000 mg by mouth every 6 (six) hours as needed for mild pain or moderate pain.   . clonazePAM (KLONOPIN) 1 MG tablet Take 1 tablet (1 mg total) by mouth 3 (three) times daily as needed.  . Diphenhyd-Hydrocort-Nystatin (FIRST-DUKES MOUTHWASH) SUSP Use as directed 5 mLs in the mouth or throat 4 (four) times daily.  . diphenoxylate-atropine (LOMOTIL) 2.5-0.025 MG tablet Take 1 tablet by mouth 4 (four) times daily as needed for diarrhea or loose stools.  Marland Kitchen dronabinol (MARINOL) 5 MG capsule Take 1 capsule (5 mg total) by mouth 2 (two) times daily before a meal.  . gabapentin (NEURONTIN) 300 MG capsule Take 3 capsules (900 mg total) by mouth 2 (two) times daily.  Marland Kitchen glimepiride (AMARYL) 2 MG tablet Take 2 mg by mouth 2 (two) times daily before a meal.   . hydrocortisone (CORTEF) 20 MG tablet Take 20 mg by mouth daily.   Marland Kitchen levofloxacin (LEVAQUIN) 500 MG tablet Take 1 tablet (500 mg total) by mouth daily.  . megestrol (MEGACE) 400 MG/10ML suspension Take 10 mLs (400 mg total) by mouth 2 (two) times daily.  . mirtazapine (REMERON) 7.5 MG tablet Take 7.5 mg by mouth at bedtime.   Marland Kitchen omeprazole (PRILOSEC) 20 MG capsule Take 20 mg by mouth 2 (two) times daily before a meal.   . ondansetron (ZOFRAN) 8 MG tablet Take 1 tablet (8 mg total) by mouth 2  (two) times daily as needed for refractory nausea / vomiting. Start on day 3 after chemotherapy.  Marland Kitchen oxyCODONE-acetaminophen (PERCOCET) 10-325 MG tablet Take 1 tablet by mouth every 6 (six) hours as needed for pain.  . Probiotic Product (PROBIOTIC DAILY PO) Take 1 capsule by mouth daily.  . prochlorperazine (COMPAZINE) 10 MG tablet Take 1 tablet (10 mg total) by mouth every 6 (six) hours as needed for nausea or vomiting.  . promethazine (PHENERGAN) 25 MG tablet Take 1 tablet (25 mg total) by mouth every 8 (eight) hours as needed for refractory nausea / vomiting.  . rivaroxaban (XARELTO) 20 MG TABS tablet Take 1 tablet (20 mg total) by mouth daily with supper.  . traZODone (DESYREL) 100 MG tablet Take 1 tablet (100 mg total) by mouth at bedtime.  . vitamin B-12 (CYANOCOBALAMIN) 1000 MCG tablet Take 1,000 mcg by mouth daily.   Facility-Administered Encounter Medications as of 12/01/2018  Medication  . sodium chloride 0.9 % injection 10 mL  . sodium chloride flush (NS) 0.9 % injection 10 mL    ALLERGIES:  Allergies  Allergen Reactions  . Oxycodone     Blisters, hallucinations  . Tramadol     Blisters, hallucinations  . Trazodone And Nefazodone Other (See Comments)    hallucinations     PHYSICAL EXAM:  ECOG Performance status: 2  Vitals:   12/01/18 0800  BP: (!) 146/87  Pulse: (!) 106  Resp: 18  Temp: 98.3 F (36.8 C)  SpO2: 100%   There were no vitals filed for this visit.  Physical Exam Constitutional:      Appearance: Normal appearance.  HENT:     Head: Normocephalic.     Nose: Nose normal.     Mouth/Throat:     Mouth: Mucous membranes are moist.     Pharynx: Oropharynx is clear.  Eyes:     Extraocular Movements: Extraocular movements intact.  Conjunctiva/sclera: Conjunctivae normal.  Neck:     Musculoskeletal: Normal range of motion.  Cardiovascular:     Rate and Rhythm: Regular rhythm. Tachycardia present.     Pulses: Normal pulses.     Heart sounds: Normal  heart sounds.  Pulmonary:     Effort: Pulmonary effort is normal.     Breath sounds: Normal breath sounds.  Abdominal:     General: Abdomen is flat. Bowel sounds are normal.     Palpations: Abdomen is soft.  Musculoskeletal: Normal range of motion.  Skin:    General: Skin is warm and dry.  Neurological:     General: No focal deficit present.     Mental Status: He is alert and oriented to person, place, and time.  Psychiatric:        Mood and Affect: Mood normal.        Behavior: Behavior normal.        Thought Content: Thought content normal.        Judgment: Judgment normal.      LABORATORY DATA:  I have reviewed the labs as listed.  CBC    Component Value Date/Time   WBC 9.9 12/01/2018 0743   RBC 4.29 12/01/2018 0743   HGB 12.7 (L) 12/01/2018 0743   HGB 11.6 (L) 02/04/2011 1059   HCT 39.4 12/01/2018 0743   HCT 34.7 (L) 02/04/2011 1059   PLT 246 12/01/2018 0743   PLT 282 02/04/2011 1059   MCV 91.8 12/01/2018 0743   MCV 76.0 (L) 02/04/2011 1059   MCH 29.6 12/01/2018 0743   MCHC 32.2 12/01/2018 0743   RDW 14.2 12/01/2018 0743   RDW 18.5 (H) 02/04/2011 1059   LYMPHSABS 1.8 12/01/2018 0743   LYMPHSABS 1.1 02/04/2011 1059   MONOABS 0.5 12/01/2018 0743   MONOABS 0.5 02/04/2011 1059   EOSABS 0.3 12/01/2018 0743   EOSABS 0.1 02/04/2011 1059   BASOSABS 0.1 12/01/2018 0743   BASOSABS 0.1 02/04/2011 1059   CMP Latest Ref Rng & Units 12/01/2018 11/29/2018 11/24/2018  Glucose 70 - 99 mg/dL 151(H) 307(H) 265(H)  BUN 8 - 23 mg/dL '16 21 18  '$ Creatinine 0.61 - 1.24 mg/dL 0.63 0.73 0.73  Sodium 135 - 145 mmol/L 136 133(L) 136  Potassium 3.5 - 5.1 mmol/L 4.1 4.1 3.9  Chloride 98 - 111 mmol/L 105 101 103  CO2 22 - 32 mmol/L '24 22 23  '$ Calcium 8.9 - 10.3 mg/dL 9.0 8.9 9.1  Total Protein 6.5 - 8.1 g/dL 6.5 6.5 6.5  Total Bilirubin 0.3 - 1.2 mg/dL 1.2 1.1 1.3(H)  Alkaline Phos 38 - 126 U/L 95 97 105  AST 15 - 41 U/L '21 25 21  '$ ALT 0 - 44 U/L '29 26 25    '$ I have personally  reviewed the scans and discussed with the patient.   ASSESSMENT & PLAN:   Rectal cancer 1.  Stage IV rectal cancer with lung and pancreatic metastasis: Foundation 1 CDX shows KRAS G13D and NRAS wild type, MS-stable. -12 cycles of FOLFOX in the adjuvant setting from October 12 through 07/14/2011. -15 cycles of FOLFOX and bevacizumab from 07/08/2016 through 01/20/2017 followed by infusional 5-FU and bevacizumab until 07/14/2017. -Maintenance Xeloda from 08/07/2017 with bevacizumab through 08/19/2018 with progression. - ERCP and stent placement on 09/16/2018 showing single localized biliary stricture.  Biliary sphincterotomy was performed with stricture dilated to 6 mm with balloon to facilitate stent placement.  1 covered metal stent was placed. -Flexible sigmoidoscopy on 09/24/2018 showing narrowing of the colorectal anastomosis.  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 - 2 cycles of FOLFIRI from 10/04/2018 through 10/25/2018 with 20% dose reduction during cycle 2. - He has developed severe tiredness after cycle 2 even after dose reduction. 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  - Today he looked much better in terms of appetite and energy.  He was reportedly eating better.  We have reviewed his blood work. - We will proceed with cycle 3 of FOLFIRI.  I will dose reduce irinotecan by 50%.  2.  Weight loss: -He did not lose any significant weight.  Mirtazapine and Marinol did not work. -He is taking Megace twice daily started on 11/07/2018. -She is able to eat solid foods.  Dysphagia has improved.  No need for GI consult at this time.  His blood sugars have gone up since he started drinking Ensure. -We have switched him to Glucerna. -He takes glimepiride 2 mg twice daily.  Fasting fingersticks are ranging between 129 and 156.  3.  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  He was diagnosed in 2016, status post  IVC filter.  4.  Anxiety: -He is taking Klonopin 1 mg 3 times a day.  This is helping most days.   Total time spent is 25 minutes with more than 50% of the time spent face-to-face discussing treatment plan, counseling and coordination of care.  Orders placed this encounter:  No orders of the defined types were placed in this encounter.     Derek Jack, MD  [place] [phone]

## 2018-12-02 ENCOUNTER — Encounter (HOSPITAL_COMMUNITY): Payer: Medicare Other

## 2018-12-03 ENCOUNTER — Encounter (HOSPITAL_COMMUNITY): Payer: Self-pay

## 2018-12-03 ENCOUNTER — Inpatient Hospital Stay (HOSPITAL_COMMUNITY): Payer: Medicare Other

## 2018-12-03 ENCOUNTER — Other Ambulatory Visit: Payer: Self-pay

## 2018-12-03 VITALS — BP 130/74 | HR 92 | Temp 97.8°F | Resp 18

## 2018-12-03 DIAGNOSIS — R634 Abnormal weight loss: Secondary | ICD-10-CM | POA: Diagnosis not present

## 2018-12-03 DIAGNOSIS — C20 Malignant neoplasm of rectum: Secondary | ICD-10-CM | POA: Diagnosis not present

## 2018-12-03 DIAGNOSIS — C7889 Secondary malignant neoplasm of other digestive organs: Secondary | ICD-10-CM | POA: Diagnosis not present

## 2018-12-03 DIAGNOSIS — Z5111 Encounter for antineoplastic chemotherapy: Secondary | ICD-10-CM | POA: Diagnosis not present

## 2018-12-03 DIAGNOSIS — C7801 Secondary malignant neoplasm of right lung: Secondary | ICD-10-CM | POA: Diagnosis not present

## 2018-12-03 DIAGNOSIS — G893 Neoplasm related pain (acute) (chronic): Secondary | ICD-10-CM | POA: Diagnosis not present

## 2018-12-03 MED ORDER — PEGFILGRASTIM-CBQV 6 MG/0.6ML ~~LOC~~ SOSY
6.0000 mg | PREFILLED_SYRINGE | Freq: Once | SUBCUTANEOUS | Status: AC
  Administered 2018-12-03: 6 mg via SUBCUTANEOUS
  Filled 2018-12-03: qty 0.6

## 2018-12-03 MED ORDER — SODIUM CHLORIDE 0.9% FLUSH
10.0000 mL | INTRAVENOUS | Status: DC | PRN
  Administered 2018-12-03: 10 mL
  Filled 2018-12-03: qty 10

## 2018-12-03 MED ORDER — HEPARIN SOD (PORK) LOCK FLUSH 100 UNIT/ML IV SOLN
500.0000 [IU] | Freq: Once | INTRAVENOUS | Status: AC | PRN
  Administered 2018-12-03: 500 [IU]

## 2018-12-03 MED ORDER — PEGFILGRASTIM INJECTION 6 MG/0.6ML ~~LOC~~
PREFILLED_SYRINGE | SUBCUTANEOUS | Status: AC
  Filled 2018-12-03: qty 0.6

## 2018-12-03 MED ORDER — PEGFILGRASTIM INJECTION 6 MG/0.6ML ~~LOC~~: 6.0000 mg | PREFILLED_SYRINGE | Freq: Once | SUBCUTANEOUS | Status: DC

## 2018-12-03 NOTE — Progress Notes (Signed)
Nutrition Follow-up:  Last seen by RD May 2020 in patient.  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  Noted ileostomy done on 09/29/2018.  Patient taking chemotherapy at this time  Contacted by nursing to help provide assistance with diabetic friendly shakes.    Patient reports that he has been drinking 6 ensure plus daily.  Wife present at visit as well.  Wife reports patient usually just eats 2 meals per day due to routine/habit.  Breakfast is 2 eggs, [place] bacon, biscuit.  Usually skips lunch and supper is meat and vegetables (last night ate fish).  Wife reports trouble swallowing as well but this is better.     Medications: reviewed  Labs: glucose 151 on 7/29  Anthropometrics:   Weight 169 lb on 7/23 increased from 165 lb on 7/20.   Noted 172 lb on 6/30   NUTRITION DIAGNOSIS: Increased nutrient needs related to cancer and cancer related treatments as evidenced by estimated nutritional requirements continues   INTERVENTION:  Provided samples of glucrerna and boost glucose control.  Patient likes chocolate the best.  Discussed ways to flavor vanilla shakes.  Patient is going to let RD know which one he likes best and then RD will determine if can provide more to him.  Also discussed difference in calorie amount with glucerna vs ensure plus (850 calories less with glucerna/boost glucose control per day). Stress to patient the importance of eating more orally and foods to choose to not increase blood glucose.   If patient unable to maintain weight and increase oral intake would recommend adjusting diabetic medication to better control blood glucose and liberalize diet.   Contact information given  MONITORING, EVALUATION, GOAL: Patient will consume adequate calories and protein to maintain weight   NEXT VISIT: Friday, August 28  Charles [name] B. Zenia Resides, [place], [place] Registered Dietitian [phone] (pager)

## 2018-12-03 NOTE — Progress Notes (Signed)
12/03/18  .Marland KitchenThe following biosimilar Ellen Henri has been selected for use in this patient and Neulasta discontinued for future treatments.  T.O. Dr Rhys Martini, PharmD

## 2018-12-06 ENCOUNTER — Encounter: Payer: Self-pay | Admitting: Internal Medicine

## 2018-12-07 DIAGNOSIS — E1151 Type 2 diabetes mellitus with diabetic peripheral angiopathy without gangrene: Secondary | ICD-10-CM | POA: Diagnosis not present

## 2018-12-07 DIAGNOSIS — C7889 Secondary malignant neoplasm of other digestive organs: Secondary | ICD-10-CM | POA: Diagnosis not present

## 2018-12-07 DIAGNOSIS — K9189 Other postprocedural complications and disorders of digestive system: Secondary | ICD-10-CM | POA: Diagnosis not present

## 2018-12-07 DIAGNOSIS — C7800 Secondary malignant neoplasm of unspecified lung: Secondary | ICD-10-CM | POA: Diagnosis not present

## 2018-12-07 DIAGNOSIS — C20 Malignant neoplasm of rectum: Secondary | ICD-10-CM | POA: Diagnosis not present

## 2018-12-07 DIAGNOSIS — Z432 Encounter for attention to ileostomy: Secondary | ICD-10-CM | POA: Diagnosis not present

## 2018-12-08 ENCOUNTER — Encounter (HOSPITAL_COMMUNITY): Payer: Self-pay | Admitting: Hematology

## 2018-12-08 ENCOUNTER — Inpatient Hospital Stay (HOSPITAL_COMMUNITY): Payer: Medicare Other

## 2018-12-08 ENCOUNTER — Inpatient Hospital Stay (HOSPITAL_COMMUNITY): Payer: Medicare Other | Attending: Hematology | Admitting: Hematology

## 2018-12-08 ENCOUNTER — Other Ambulatory Visit: Payer: Self-pay

## 2018-12-08 DIAGNOSIS — C7889 Secondary malignant neoplasm of other digestive organs: Secondary | ICD-10-CM | POA: Diagnosis not present

## 2018-12-08 DIAGNOSIS — Z86718 Personal history of other venous thrombosis and embolism: Secondary | ICD-10-CM | POA: Diagnosis not present

## 2018-12-08 DIAGNOSIS — Z7901 Long term (current) use of anticoagulants: Secondary | ICD-10-CM | POA: Diagnosis not present

## 2018-12-08 DIAGNOSIS — Z86711 Personal history of pulmonary embolism: Secondary | ICD-10-CM | POA: Insufficient documentation

## 2018-12-08 DIAGNOSIS — R634 Abnormal weight loss: Secondary | ICD-10-CM | POA: Diagnosis not present

## 2018-12-08 DIAGNOSIS — K9189 Other postprocedural complications and disorders of digestive system: Secondary | ICD-10-CM | POA: Diagnosis not present

## 2018-12-08 DIAGNOSIS — C7801 Secondary malignant neoplasm of right lung: Secondary | ICD-10-CM | POA: Diagnosis not present

## 2018-12-08 DIAGNOSIS — Z932 Ileostomy status: Secondary | ICD-10-CM | POA: Insufficient documentation

## 2018-12-08 DIAGNOSIS — C20 Malignant neoplasm of rectum: Secondary | ICD-10-CM

## 2018-12-08 DIAGNOSIS — Z5189 Encounter for other specified aftercare: Secondary | ICD-10-CM | POA: Diagnosis not present

## 2018-12-08 DIAGNOSIS — E119 Type 2 diabetes mellitus without complications: Secondary | ICD-10-CM | POA: Diagnosis not present

## 2018-12-08 DIAGNOSIS — F419 Anxiety disorder, unspecified: Secondary | ICD-10-CM | POA: Insufficient documentation

## 2018-12-08 DIAGNOSIS — Z5112 Encounter for antineoplastic immunotherapy: Secondary | ICD-10-CM | POA: Insufficient documentation

## 2018-12-08 DIAGNOSIS — E1151 Type 2 diabetes mellitus with diabetic peripheral angiopathy without gangrene: Secondary | ICD-10-CM | POA: Diagnosis not present

## 2018-12-08 DIAGNOSIS — Z79899 Other long term (current) drug therapy: Secondary | ICD-10-CM | POA: Insufficient documentation

## 2018-12-08 DIAGNOSIS — C7800 Secondary malignant neoplasm of unspecified lung: Secondary | ICD-10-CM | POA: Diagnosis not present

## 2018-12-08 DIAGNOSIS — Z5111 Encounter for antineoplastic chemotherapy: Secondary | ICD-10-CM | POA: Insufficient documentation

## 2018-12-08 DIAGNOSIS — Z432 Encounter for attention to ileostomy: Secondary | ICD-10-CM | POA: Diagnosis not present

## 2018-12-08 LAB — CBC WITH DIFFERENTIAL/PLATELET
Abs Immature Granulocytes: 0.06 10*3/uL (ref 0.00–0.07)
Basophils Absolute: 0 10*3/uL (ref 0.0–0.1)
Basophils Relative: 1 %
Eosinophils Absolute: 0.3 10*3/uL (ref 0.0–0.5)
Eosinophils Relative: 8 %
HCT: 38.1 % — ABNORMAL LOW (ref 39.0–52.0)
Hemoglobin: 12.4 g/dL — ABNORMAL LOW (ref 13.0–17.0)
Immature Granulocytes: 2 %
Lymphocytes Relative: 34 %
Lymphs Abs: 1.2 10*3/uL (ref 0.7–4.0)
MCH: 29.7 pg (ref 26.0–34.0)
MCHC: 32.5 g/dL (ref 30.0–36.0)
MCV: 91.4 fL (ref 80.0–100.0)
Monocytes Absolute: 0.2 10*3/uL (ref 0.1–1.0)
Monocytes Relative: 7 %
Neutro Abs: 1.7 10*3/uL (ref 1.7–7.7)
Neutrophils Relative %: 48 %
Platelets: 82 10*3/uL — ABNORMAL LOW (ref 150–400)
RBC: 4.17 MIL/uL — ABNORMAL LOW (ref 4.22–5.81)
RDW: 13.9 % (ref 11.5–15.5)
WBC: 3.4 10*3/uL — ABNORMAL LOW (ref 4.0–10.5)
nRBC: 0 % (ref 0.0–0.2)

## 2018-12-08 LAB — COMPREHENSIVE METABOLIC PANEL
ALT: 20 U/L (ref 0–44)
AST: 16 U/L (ref 15–41)
Albumin: 3.4 g/dL — ABNORMAL LOW (ref 3.5–5.0)
Alkaline Phosphatase: 110 U/L (ref 38–126)
Anion gap: 11 (ref 5–15)
BUN: 14 mg/dL (ref 8–23)
CO2: 25 mmol/L (ref 22–32)
Calcium: 8.9 mg/dL (ref 8.9–10.3)
Chloride: 99 mmol/L (ref 98–111)
Creatinine, Ser: 0.76 mg/dL (ref 0.61–1.24)
GFR calc Af Amer: >60 mL/min (ref >60)
GFR calc non Af Amer: >60 mL/min (ref >60)
Glucose, Bld: 282 mg/dL — ABNORMAL HIGH (ref 70–99)
Potassium: 4.4 mmol/L (ref 3.5–5.1)
Sodium: 135 mmol/L (ref 135–145)
Total Bilirubin: 1.3 mg/dL — ABNORMAL HIGH (ref 0.3–1.2)
Total Protein: 6.3 g/dL — ABNORMAL LOW (ref 6.5–8.1)

## 2018-12-08 NOTE — Progress Notes (Signed)
No need for fluids today per MD.

## 2018-12-08 NOTE — Patient Instructions (Signed)
[facility] at [facility] Discharge Instructions  You were seen today by Dr. [name]. He went over your recent results. He will see you back in 1 week for labs and follow up.   Thank you for choosing [facility] at [facility] to provide your oncology and hematology care.  To afford each patient quality time with our provider, please arrive at least 15 minutes before your scheduled appointment time.   If you have a lab appointment with the [facility] please come in thru the  Main Entrance and check in at the main information desk  You need to re-schedule your appointment should you arrive 10 or more minutes late.  We strive to give you quality time with our providers, and arriving late affects you and other patients whose appointments are after yours.  Also, if you no show three or more times for appointments you may be dismissed from the clinic at the providers discretion.     Again, thank you for choosing [facility].  Our hope is that these requests will decrease the amount of time that you wait before being seen by our physicians.       _____________________________________________________________  Should you have questions after your visit to [facility], please contact our office at [phone] between the hours of 8:00 a.m. and 4:30 p.m.  Voicemails left after 4:00 p.m. will not be returned until the following business day.  For prescription refill requests, have your pharmacy contact our office and allow 72 hours.    Cancer Center Support Programs:   > Cancer Support Group  2nd Tuesday of the month 1pm-2pm, Journey Room

## 2018-12-08 NOTE — Assessment & Plan Note (Signed)
1.  Stage IV rectal cancer with lung and pancreatic metastasis: Foundation 1 CDX shows KRAS G13D and NRAS wild type, MS-stable. -12 cycles of FOLFOX in the adjuvant setting from October 12 through 07/14/2011. -15 cycles of FOLFOX and bevacizumab from 07/08/2016 through 01/20/2017 followed by infusional 5-FU and bevacizumab until 07/14/2017. -Maintenance Xeloda from 08/07/2017 with bevacizumab through 08/19/2018 with progression. - ERCP and stent placement on 09/16/2018 showing single localized biliary stricture.  Biliary sphincterotomy was performed with stricture dilated to 6 mm with balloon to facilitate stent placement.  1 covered metal stent was placed. -Flexible sigmoidoscopy on 09/24/2018 showing narrowing of the colorectal anastomosis.  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 - 2 cycles of FOLFIRI from 10/04/2018 through 10/25/2018 with 20% dose reduction during cycle 2. - He has developed severe tiredness after cycle 2 even after dose reduction. 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  - He received cycle 3 with 50% dose reduction of Irinotecan on 12/01/2018. - He has tolerated this cycle very well.  Did not have any problems with nausea vomiting or diarrhea.  No severe fatigue reported.  He will come back in 1 week for his next cycle.  2.  Weight loss: -He is taking Megace twice daily.  He is drinking Ensure for diabetes 3 cans/day. -He is also eating 3 meals per day.  He has not lost any weight.  3.  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  4.  Anxiety: -He is taking Klonopin 1 mg 3 times a day.  This is helping most days.

## 2018-12-08 NOTE — Progress Notes (Signed)
Charles [name], [place] [id number]   CLINIC:  Medical Oncology/Hematology  PCP:  [name], MD [place] [address] [phone]   REASON FOR VISIT:  Follow-up for Stage IV Colon Cancer  CURRENT THERAPY: FOLFIRI  BRIEF ONCOLOGIC HISTORY:  Oncology History  Rectal cancer ([place])  07/24/2010 Initial Diagnosis   Invasive adenocarcinoma of rectum   09/09/2010 Concurrent Chemotherapy   S/P radiation and concurrent 5-FU continuous infusion from 09/09/10- 10/10/10.   11/14/2010 Surgery   S/P proctectomy with colorectal anastomosis and diverting loop ileostomy on 11/14/10 at [facility] by Dr. Harlon Ditty. Pathology reveals a pT3b N1 with 3/20 lymph nodes.   02/05/2011 - 07/14/2011 Chemotherapy   FOLFOX   08/18/2011 Surgery   Approximate date of surgery- [place] by Dr. Harlon Ditty    Remission     11/24/2013 Survivorship   Colonoscopy- somewhat fibrotic/friable anastomotic mucosal-status post biopsy (narrowing not felt to be clinically significant).  Negative pathology for malignancy   09/12/2014 Imaging   DVT in the left femoral venous system, left common iliac vein, IVC, and within the IVC filter Right lower extremity venography confirms chronic occlusion of the femoral venous system with collateralization. The right iliac venous system is patent and do   09/25/2014 PET scan   The right middle lobe pulmonary nodule is hypermetabolic, favored to represent a primary bronchogenic carcinoma.Equivocal mediastinal nodes, similar to surrounding blood pool. Bilateral adrenal hypermetabolism, felt to be physiologic   10/24/2014 Pathology Results   Lung, needle/core biopsy(ies), RML - ADENOCARCINOMA, SEE COMMENT metastatic adenocarcinoma of a colorectal primary   10/24/2014 Relapse/Recurrence     11/16/2014 Definitive Surgery   Bronchoscopy, right video-assisted thoracoscopy, wedge resection of right middle lobe by Dr. Servando Snare   11/16/2014  Pathology Results   Lung, wedge biopsy/resection, right lingula and small portion of middle lobe - METASTATIC ADENOCARCINOMA, CONSISTENT WITH COLORECTAL PRIMARY, SPANNING 2.0 CM. - THE SURGICAL RESECTION MARGINS ARE NEGATIVE FOR ADENOCARCINOMA.   11/16/2014 Remission   THE SURGICAL RESECTION MARGINS ARE NEGATIVE FOR ADENOCARCINOMA.   03/07/2015 Imaging   CT CAP- Interval resection of right middle lobe metastasis. No acute process or evidence of metastatic disease in the chest, abdomen or pelvis. Improved right upper lobe reticular nodular opacities are favored to represent resolving infection.   09/14/2015 Imaging   CT CAP- No findings of recurrent malignancy. No recurrence along the wedge resection site of the right middle lobe. Right anterior abdominal wall focal hernia containing a knuckle of small bowel without complicating feature.   06/13/2016 Imaging   CT CAP- 1. New right-sided pleural metastasis/mass. Other smaller right-sided pulmonary nodules which are all pleural-based and most likely represent pleural metastasis. 2. No convincing evidence of abdominopelvic nodal metastasis. 3. Constellation of findings, including pancreatic atrophy, duct dilatation, and pancreatic head soft tissue fullness which are highly suspicious for pancreatic adenocarcinoma. Metastatic disease felt much less likely. Consider endoscopic ultrasound sampling or ERCP. Cannot exclude superimposed acute pancreatitis. 4. New enlargement of the appendiceal tip with subtle surrounding edema. Cannot exclude early or mild appendicitis. 5. Coronary artery atherosclerosis. Aortic atherosclerosis. 6. 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   06/13/2016 Progression   CT scan demonstrates progression of disease   06/19/2016 Procedure   EUS with FNA by Dr. Ardis Hughs   06/20/2016 Pathology Results   FINE NEEDLE ASPIRATION, ENDOSCOPIC, PANCREAS UNCINATE AREA(SPECIMEN 1 OF 1 COLLECTED 06/19/16):  MALIGNANT CELLS CONSISTENT WITH METASTATIC ADENOCARCINOMA.  06/25/2016 PET scan   1. Two pleural-based nodules in the right hemithorax are hypermetabolic. Metastatic disease is a distinct consideration. The scattered pulmonary parenchymal nodule seen on previous diagnostic CT imaging are below the threshold for reliable resolution on PET imaging. 2. Hypermetabolic lesion pancreatic head, consistent with neoplasm. As noted on prior CT, adenocarcinoma is any consideration. No evidence for hypermetabolic abdominal lymphadenopathy. Mottled uptake noted in the liver, but no discrete hepatic metastases are evident on PET imaging. 3. Appendix remains distended up to 0.9-10 mm diameter with a stone towards the tip in subtle periappendiceal edema/inflammation. The appendix is hypermetabolic along its length. Imaging features are relatively stable in the 12 day interval since prior CT scan. While appendicitis is a consideration, the relative stability over 12 days would be unusual for that etiology. Continued close follow-up recommended.   06/26/2016 Pathology Results   Not enough tissue for foundationONE or K-ras testing.   07/07/2016 Procedure   Port placed by Dr. Arnoldo Morale   07/08/2016 -  Chemotherapy   The patient had pegfilgrastim (NEULASTA) injection 6 mg, 6 mg, Subcutaneous,  Once, 0 of 4 cycles  ondansetron (ZOFRAN) IVPB 8 mg, 8 mg, Intravenous,  Once, 0 of 4 cycles  leucovorin 804 mg in dextrose 5 % 250 mL infusion, 400 mg/m2, Intravenous,  Once, 0 of 4 cycles  oxaliplatin (ELOXATIN) 170 mg in dextrose 5 % 500 mL chemo infusion, 85 mg/m2, Intravenous,  Once, 0 of 4 cycles  fluorouracil (ADRUCIL) 4,800 mg in sodium chloride 0.9 % 150 mL chemo infusion, 2,400 mg/m2 = 4,800 mg, Intravenous, 1 Day/Dose, 0 of 4 cycles  fluorouracil (ADRUCIL) chemo injection 800 mg, 400 mg/m2, Intravenous,  Once, 0 of 4 cycles  pegfilgrastim (NEULASTA) injection 6 mg, 6 mg, Subcutaneous,  Once, 2 of 2  cycles  ondansetron (ZOFRAN) IVPB 8 mg, 8 mg, Intravenous,  Once, 12 of 12 cycles  leucovorin 660 mg in dextrose 5 % 250 mL infusion, 660 mg (original dose ), Intravenous,  Once, 12 of 12 cycles Dose modification: 660 mg (Cycle 1)  oxaliplatin (ELOXATIN) 140 mg in dextrose 5 % 500 mL chemo infusion, 140 mg (original dose ), Intravenous,  Once, 12 of 12 cycles Dose modification: 140 mg (Cycle 1), 119 mg (85 % of original dose 140 mg, Cycle 8, Reason: Provider Judgment)  fluorouracil (ADRUCIL) 3,850 mg in sodium chloride 0.9 % 150 mL chemo infusion, 3,850 mg (original dose ), Intravenous, 1 Day/Dose, 12 of 12 cycles Dose modification: 3,850 mg (Cycle 1)  fluorouracil (ADRUCIL) chemo injection 700 mg, 700 mg (original dose ), Intravenous,  Once, 12 of 12 cycles Dose modification: 700 mg (Cycle 1)  palonosetron (ALOXI) injection 0.25 mg, 0.25 mg, Intravenous,  Once, 7 of 12 cycles Administration: 0.25 mg (09/30/2016)  bevacizumab (AVASTIN) 475 mg in sodium chloride 0.9 % 100 mL chemo infusion, 5 mg/kg = 475 mg, Intravenous,  Once, 6 of 11 cycles Administration: 500 mg (09/30/2016)  leucovorin 880 mg in dextrose 5 % 250 mL infusion, 400 mg/m2 = 880 mg, Intravenous,  Once, 7 of 12 cycles Administration: 880 mg (09/30/2016)  oxaliplatin (ELOXATIN) 185 mg in dextrose 5 % 500 mL chemo infusion, 85 mg/m2 = 185 mg, Intravenous,  Once, 7 of 12 cycles Administration: 185 mg (09/30/2016)  fluorouracil (ADRUCIL) chemo injection 900 mg, 400 mg/m2 = 900 mg, Intravenous,  Once, 7 of 12 cycles Administration: 900 mg (09/30/2016)  fluorouracil (ADRUCIL) 5,300 mg in sodium chloride 0.9 % 144 mL chemo infusion, 2,400 mg/m2 = 5,300  mg, Intravenous, 1 Day/Dose, 7 of 12 cycles Administration: 5,300 mg (09/30/2016)  for chemotherapy treatment.     08/27/2016 Imaging   CT CAP- 1. Pulmonary metastatic disease is stable. 2. Pancreatic head mass, grossly stable, with progressive atrophy of the body and tail of the  pancreas. Stable peripancreatic lymph node. 3. Mild circumferential rectal wall thickening, stable. 4. Aortic atherosclerosis (ICD10-170.0). Coronary artery calcification. 5. Hyperattenuating lesion off the lower pole left kidney, stable, too small to characterize. Continued attention on followup exams is warranted. 6. Persistent wall thickening and mild dilatation of the proximal appendix, of uncertain etiology. Continued attention on followup exams is warranted.   11/06/2016 Imaging   CT C/A/P: IMPRESSION: 1. Stable pulmonary metastatic disease. 2. Similar appearing pancreatic head mass. Progressed atrophy of the pancreatic body and tail. Stable peripancreatic lymph node. 3. Stable hypoattenuating lesion partially exophytic off the inferior pole of the left kidney. 4. Aortic atherosclerosis. 5. Persistent mild wall thickening and mild dilatation of the appendix.    01/30/2017 Imaging   Ct C/A/P: IMPRESSION: 1. Stable pulmonary metastatic disease. 2. Stable to slight increase in size of pancreatic head mass. 3.  Aortic Atherosclerosis (ICD10-I70.0). 4. Similar appearance of mild wall thickening of the appendix which contains an appendicolith.   05/01/2017 Imaging   CT C/A/P: Stable disease in the lungs, no progression of disease in the abdomen.   10/04/2018 -  Chemotherapy   The patient had palonosetron (ALOXI) injection 0.25 mg, 0.25 mg, Intravenous,  Once, 3 of 4 cycles Administration: 0.25 mg (10/04/2018), 0.25 mg (10/25/2018), 0.25 mg (12/01/2018) pegfilgrastim (NEULASTA) injection 6 mg, 6 mg, Subcutaneous, Once, 2 of 2 cycles Administration: 6 mg (10/27/2018) pegfilgrastim-cbqv (UDENYCA) injection 6 mg, 6 mg, Subcutaneous, Once, 1 of 2 cycles Administration: 6 mg (12/03/2018) bevacizumab (AVASTIN) 450 mg in sodium chloride 0.9 % 100 mL chemo infusion, 5 mg/kg = 450 mg, Intravenous,  Once, 2 of 3 cycles Administration: 400 mg (12/01/2018) irinotecan (CAMPTOSAR) 380 mg in sodium  chloride 0.9 % 500 mL chemo infusion, 180 mg/m2 = 380 mg, Intravenous,  Once, 3 of 4 cycles Dose modification: 144 mg/m2 (original dose 180 mg/m2, Cycle 2, Reason: Provider Judgment, Comment: 20% dose reduction), 90 mg/m2 (50 % of original dose 180 mg/m2, Cycle 3, Reason: Other (see comments), Comment: [id number]) Administration: 380 mg (10/04/2018), 300 mg (10/25/2018), 200 mg (12/01/2018) leucovorin 900 mg in sodium chloride 0.9 % 250 mL infusion, 421 mg/m2 = 856 mg, Intravenous,  Once, 3 of 4 cycles Dose modification: 320 mg/m2 (original dose 400 mg/m2, Cycle 2, Reason: Provider Judgment, Comment: 20% dose reduction) Administration: 900 mg (10/04/2018), 700 mg (10/25/2018), 700 mg (12/01/2018) fluorouracil (ADRUCIL) chemo injection 850 mg, 400 mg/m2 = 850 mg, Intravenous,  Once, 3 of 4 cycles Dose modification: 320 mg/m2 (original dose 400 mg/m2, Cycle 2, Reason: Provider Judgment, Comment: 20% dose reduction) Administration: 850 mg (10/04/2018), 700 mg (10/25/2018), 700 mg (12/01/2018) fosaprepitant (EMEND) 150 mg, dexamethasone (DECADRON) 12 mg in sodium chloride 0.9 % 145 mL IVPB, , Intravenous,  Once, 1 of 2 cycles Administration:  (12/01/2018) fluorouracil (ADRUCIL) 5,000 mg in sodium chloride 0.9 % 150 mL chemo infusion, 2,325 mg/m2 = 5,150 mg, Intravenous, 1 Day/Dose, 3 of 4 cycles Dose modification: 1,920 mg/m2 (original dose 2,400 mg/m2, Cycle 2, Reason: Provider Judgment, Comment: 20% dose reduction) Administration: 5,000 mg (10/04/2018), 4,100 mg (10/25/2018), 4,100 mg (12/01/2018)  for chemotherapy treatment.       CANCER STAGING: Cancer Staging Rectal cancer [facility]) Staging form: Colon and Rectum, AJCC  7th Edition - Clinical: Stage IIIB (T3, N1, M0) - Signed by [name], MD on 02/04/2011 - Pathologic stage from 11/17/2014: Stage IVA (M1a) - Signed by Baird Cancer, PA-C on 09/14/2015    INTERVAL HISTORY:  Charles [name] 68 y.o. male seen for follow-up after his last cycle of  chemotherapy.  Cycle 3 was given on 12/01/2018.  In the interim 1 week he did not experience any mouth sores.  No severe fatigue.  Denies any nausea, vomiting, diarrhea or constipation.  No bleeding into the colostomy bag.  No fevers or infections.  No tingling or numbness reported.  Mild dizziness is stable.  Appetite is 75%.  Energy levels are 50%.  REVIEW OF SYSTEMS:  Review of Systems  Neurological: Positive for dizziness.  All other systems reviewed and are negative.    PAST MEDICAL/SURGICAL HISTORY:  Past Medical History:  Diagnosis Date  . Chronic anticoagulation   . Colon cancer ([place]) 07/24/2010   rectal ca, inv adenocarcinoma  . Depression 04/01/2011  . Diabetes mellitus without complication ([place])   . DVT (deep venous thrombosis) ([place]) 05/09/2011  . GERD (gastroesophageal reflux disease)   . High output ileostomy ([place]) 05/21/2011  . History of kidney stones   . HTN (hypertension)   . Hx of radiation therapy 09/02/10 to 10/14/10   pelvis  . Lung metastasis ([place])   . Neuropathy   . Peripheral vascular disease ([place])    dvt's,pe  . Pneumonia    hx x3  . Pulmonary embolism ([place])   . Rectal cancer ([place]) 01/15/2011   S/P radiation and concurrent 5-FU continuous infusion from 09/09/10- 10/10/10.  S/P proctectomy with colorectal anastomosis and diverting loop ileostomy on 11/14/10 at [facility] by Dr. Harlon Ditty. Pathology reveals a pT3b N1 with 3/20 lymph nodes.     Past Surgical History:  Procedure Laterality Date  . BILIARY STENT PLACEMENT N/A 09/16/2018   Procedure: STENT PLACEMENT;  Surgeon: Rogene Houston, MD;  [facility];  Service: Endoscopy;  Laterality: N/A;  . BIOPSY  09/24/2018   Procedure: BIOPSY;  Surgeon: Danie Binder, MD;  [facility];  Service: Endoscopy;;  . COLON SURGERY  11/14/2010   proctectomy with colorectal anastomosis and diverting loop ileostomy (temporary planned)  . COLONOSCOPY  07/2010   proximal rectal apple core mass 10-14cm  from anal verge (adenocarcinoma), 2-3cm distal rectal carpet polyp s/p piecemeal snare polypectomy (adenoma)  . COLONOSCOPY  04/21/2012   RMR: Friable,fibrotic appearing colorectal anastomosis producing some luminal narrowing-not felt to be critical. path: focal erosion with slight inflammation and hyperemia. SURVEILLANCE DUE DEC 2015  . COLONOSCOPY N/A 11/24/2013   Dr. Rourk:somewhat fibrotic/friable anastomotic mucosal-status post biopsy (narrowing not felt to be clinically significant) Single colonic diverticulum. benign polypoid rectal mucosa  . colostomy reversal  april 2013  . ERCP N/A 09/16/2018   Procedure: ENDOSCOPIC RETROGRADE CHOLANGIOPANCREATOGRAPHY (ERCP);  Surgeon: Rogene Houston, MD;  [facility];  Service: Endoscopy;  Laterality: N/A;  . ESOPHAGOGASTRODUODENOSCOPY  07/2010   RMR: schatki ring s/p dilation, small hh, SB bx benign  . ESOPHAGOGASTRODUODENOSCOPY N/A 09/04/2015   Procedure: ESOPHAGOGASTRODUODENOSCOPY (EGD);  Surgeon: Daneil Dolin, MD;  Location[facility];  Service: Endoscopy;  Laterality: N/A;  730  . EUS  08/2010   Dr. Owens Loffler. uT3N0 circumferential, nearly obstruction rectosigmoid adenocarcinoma, distal edge 12cm from anal verge  . EUS N/A 06/19/2016   Procedure: UPPER ENDOSCOPIC ULTRASOUND (EUS) RADIAL;  Surgeon: Milus Banister,  MD;  Location: [facility] ENDOS[facility];  Service: Endoscopy;  Laterality: N/A;  . FLEXIBLE SIGMOIDOSCOPY N/A 09/24/2018   Procedure: FLEXIBLE SIGMOIDOSCOPY;  Surgeon: Danie Binder, MD;  [facility];  Service: Endoscopy;  Laterality: N/A;  . HERNIA REPAIR     abd hernia repair  . IVC filter    . ivc filter    . port a cath placement    . PORT-A-CATH REMOVAL  09/24/2011   Procedure: REMOVAL PORT-A-CATH;  Surgeon: Donato Heinz, MD;  Location: [facility] ORS;  Service: General;  Laterality: N/A;  Minor Room  . PORTACATH PLACEMENT Right 07/07/2016   Procedure: INSERTION PORT-A-CATH;  Surgeon: Aviva Signs, MD;  Location: [facility]  ORS;  Service: General;  Laterality: Right;  . SPHINCTEROTOMY N/A 09/16/2018   Procedure: SPHINCTEROTOMY with balloon dialation;  Surgeon: Rogene Houston, MD;  [facility];  Service: Endoscopy;  Laterality: N/A;  . TRANSVERSE LOOP COLOSTOMY N/A 09/29/2018   Procedure: TRANSVERSE LOOP COLOSTOMY;  Surgeon: Aviva Signs, MD;  Location: [facility] ORS;  Service: General;  Laterality: N/A;  . VIDEO ASSISTED THORACOSCOPY (VATS)/WEDGE RESECTION Right 11/15/2014   Procedure: VIDEO ASSISTED THORACOSCOPY (VATS)/LUNG RESECTION WITH RIGHT LINGULECTOMY;  Surgeon: [name], MD;  Location: [facility] [place];  Service: Thoracic;  Laterality: Right;  Marland Kitchen VIDEO BRONCHOSCOPY N/A 11/15/2014   Procedure: VIDEO BRONCHOSCOPY;  Surgeon: [name], MD;  Location: [facility] OR;  Service: Thoracic;  Laterality: N/A;     SOCIAL HISTORY:  Social History   Socioeconomic History  . Marital status: Married    Spouse name: Not on file  . Number of children: 2  . Years of education: Not on file  . Highest education level: Not on file  Occupational History  . Occupation: self-employed Regulatory affairs officer, currently not working    Fish farm manager: SELF EMPLOYED  Social Needs  . Financial resource strain: Not very hard  . Food insecurity    Worry: Never true    Inability: Never true  . Transportation needs    Medical: No    Non-medical: No  Tobacco Use  . Smoking status: Never Smoker  . Smokeless tobacco: Never Used  Substance and Sexual Activity  . Alcohol use: No  . Drug use: No  . Sexual activity: Yes    Birth control/protection: None    Comment: married  Lifestyle  . Physical activity    Days per week: 0 days    Minutes per session: 0 min  . Stress: Only a little  Relationships  . Social connections    Talks on phone: More than three times a week    Gets together: Once a week    Attends religious service: Patient refused    Active member of club or organization: Patient refused    Attends  meetings of clubs or organizations: Patient refused    Relationship status: Patient refused  . Intimate partner violence    Fear of current or ex partner: Patient refused    Emotionally abused: Patient refused    Physically abused: Patient refused    Forced sexual activity: Patient refused  Other Topics Concern  . Not on file  Social History Narrative  . Not on file    FAMILY HISTORY:  Family History  Problem Relation Age of Onset  . Cancer Brother        throat  . Cancer Brother        prostate  . Colon cancer Neg Hx   . Liver disease Neg Hx   .  Inflammatory bowel disease Neg Hx     CURRENT MEDICATIONS:  Outpatient Encounter Medications as of 12/08/2018  Medication Sig  . acetaminophen (TYLENOL) 500 MG tablet Take 1,000 mg by mouth every 6 (six) hours as needed for mild pain or moderate pain.   . clonazePAM (KLONOPIN) 1 MG tablet Take 1 tablet (1 mg total) by mouth 3 (three) times daily as needed.  . Diphenhyd-Hydrocort-Nystatin (FIRST-DUKES MOUTHWASH) SUSP Use as directed 5 mLs in the mouth or throat 4 (four) times daily.  . diphenoxylate-atropine (LOMOTIL) 2.5-0.025 MG tablet Take 1 tablet by mouth 4 (four) times daily as needed for diarrhea or loose stools.  Marland Kitchen dronabinol (MARINOL) 5 MG capsule Take 1 capsule (5 mg total) by mouth 2 (two) times daily before a meal.  . gabapentin (NEURONTIN) 300 MG capsule Take 3 capsules (900 mg total) by mouth 2 (two) times daily.  Marland Kitchen glimepiride (AMARYL) 2 MG tablet Take 2 mg by mouth 2 (two) times daily before a meal.   . hydrocortisone (CORTEF) 20 MG tablet Take 20 mg by mouth daily.   Marland Kitchen levofloxacin (LEVAQUIN) 500 MG tablet Take 1 tablet (500 mg total) by mouth daily.  . megestrol (MEGACE) 400 MG/10ML suspension Take 10 mLs (400 mg total) by mouth 2 (two) times daily.  . mirtazapine (REMERON) 7.5 MG tablet Take 7.5 mg by mouth at bedtime.   Marland Kitchen omeprazole (PRILOSEC) 20 MG capsule Take 20 mg by mouth 2 (two) times daily before a meal.   .  ondansetron (ZOFRAN) 8 MG tablet Take 1 tablet (8 mg total) by mouth 2 (two) times daily as needed for refractory nausea / vomiting. Start on day 3 after chemotherapy.  Marland Kitchen oxyCODONE-acetaminophen (PERCOCET) 10-325 MG tablet Take 1 tablet by mouth every 6 (six) hours as needed for pain.  . Probiotic Product (PROBIOTIC DAILY PO) Take 1 capsule by mouth daily.  . prochlorperazine (COMPAZINE) 10 MG tablet Take 1 tablet (10 mg total) by mouth every 6 (six) hours as needed for nausea or vomiting.  . promethazine (PHENERGAN) 25 MG tablet Take 1 tablet (25 mg total) by mouth every 8 (eight) hours as needed for refractory nausea / vomiting.  . rivaroxaban (XARELTO) 20 MG TABS tablet Take 1 tablet (20 mg total) by mouth daily with supper.  . traZODone (DESYREL) 100 MG tablet Take 1 tablet (100 mg total) by mouth at bedtime.  . vitamin B-12 (CYANOCOBALAMIN) 1000 MCG tablet Take 1,000 mcg by mouth daily.   Facility-Administered Encounter Medications as of 12/08/2018  Medication  . sodium chloride 0.9 % injection 10 mL  . sodium chloride flush (NS) 0.9 % injection 10 mL    ALLERGIES:  Allergies  Allergen Reactions  . Oxycodone     Blisters, hallucinations  . Tramadol     Blisters, hallucinations  . Trazodone And Nefazodone Other (See Comments)    hallucinations     PHYSICAL EXAM:  ECOG Performance status: 2  Vitals:   12/08/18 0914  BP: 136/76  Pulse: 100  Resp: 18  Temp: 97.9 F (36.6 C)  SpO2: 99%   Filed Weights   12/08/18 0914  Weight: 168 lb 12.8 oz (76.6 kg)    Physical Exam Constitutional:      Appearance: Normal appearance.  HENT:     Head: Normocephalic.     Nose: Nose normal.     Mouth/Throat:     Mouth: Mucous membranes are moist.     Pharynx: Oropharynx is clear.  Eyes:     Extraocular  Movements: Extraocular movements intact.     Conjunctiva/sclera: Conjunctivae normal.  Neck:     Musculoskeletal: Normal range of motion.  Cardiovascular:     Rate and Rhythm:  Regular rhythm. Tachycardia present.     Pulses: Normal pulses.     Heart sounds: Normal heart sounds.  Pulmonary:     Effort: Pulmonary effort is normal.     Breath sounds: Normal breath sounds.  Abdominal:     General: Abdomen is flat. Bowel sounds are normal.     Palpations: Abdomen is soft.  Musculoskeletal: Normal range of motion.  Skin:    General: Skin is warm and dry.  Neurological:     General: No focal deficit present.     Mental Status: He is alert and oriented to person, place, and time.  Psychiatric:        Mood and Affect: Mood normal.        Behavior: Behavior normal.        Thought Content: Thought content normal.        Judgment: Judgment normal.      LABORATORY DATA:  I have reviewed the labs as listed.  CBC    Component Value Date/Time   WBC 3.4 (L) 12/08/2018 0823   RBC 4.17 (L) 12/08/2018 0823   HGB 12.4 (L) 12/08/2018 0823   HGB 11.6 (L) 02/04/2011 1059   HCT 38.1 (L) 12/08/2018 0823   HCT 34.7 (L) 02/04/2011 1059   PLT 82 (L) 12/08/2018 0823   PLT 282 02/04/2011 1059   MCV 91.4 12/08/2018 0823   MCV 76.0 (L) 02/04/2011 1059   MCH 29.7 12/08/2018 0823   MCHC 32.5 12/08/2018 0823   RDW 13.9 12/08/2018 0823   RDW 18.5 (H) 02/04/2011 1059   LYMPHSABS 1.2 12/08/2018 0823   LYMPHSABS 1.1 02/04/2011 1059   MONOABS 0.2 12/08/2018 0823   MONOABS 0.5 02/04/2011 1059   EOSABS 0.3 12/08/2018 0823   EOSABS 0.1 02/04/2011 1059   BASOSABS 0.0 12/08/2018 0823   BASOSABS 0.1 02/04/2011 1059   CMP Latest Ref Rng & Units 12/08/2018 12/01/2018 11/29/2018  Glucose 70 - 99 mg/dL 282(H) 151(H) 307(H)  BUN 8 - 23 mg/dL '14 16 21  '$ Creatinine 0.61 - 1.24 mg/dL 0.76 0.63 0.73  Sodium 135 - 145 mmol/L 135 136 133(L)  Potassium 3.5 - 5.1 mmol/L 4.4 4.1 4.1  Chloride 98 - 111 mmol/L 99 105 101  CO2 22 - 32 mmol/L '25 24 22  '$ Calcium 8.9 - 10.3 mg/dL 8.9 9.0 8.9  Total Protein 6.5 - 8.1 g/dL 6.3(L) 6.5 6.5  Total Bilirubin 0.3 - 1.2 mg/dL 1.3(H) 1.2 1.1  Alkaline Phos  38 - 126 U/L 110 95 97  AST 15 - 41 U/L '16 21 25  '$ ALT 0 - 44 U/L '20 29 26    '$ I have personally reviewed the scans and discussed with the patient.   ASSESSMENT & PLAN:   Rectal cancer 1.  Stage IV rectal cancer with lung and pancreatic metastasis: Foundation 1 CDX shows KRAS G13D and NRAS wild type, MS-stable. -12 cycles of FOLFOX in the adjuvant setting from October 12 through 07/14/2011. -15 cycles of FOLFOX and bevacizumab from 07/08/2016 through 01/20/2017 followed by infusional 5-FU and bevacizumab until 07/14/2017. -Maintenance Xeloda from 08/07/2017 with bevacizumab through 08/19/2018 with progression. - ERCP and stent placement on 09/16/2018 showing single localized biliary stricture.  Biliary sphincterotomy was performed with stricture dilated to 6 mm with balloon to facilitate stent placement.  1 covered metal stent  was placed. -Flexible sigmoidoscopy on 09/24/2018 showing narrowing of the colorectal anastomosis.  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 - 2 cycles of FOLFIRI from 10/04/2018 through 10/25/2018 with 20% dose reduction during cycle 2. - He has developed severe tiredness after cycle 2 even after dose reduction. 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  - He received cycle 3 with 50% dose reduction of Irinotecan on 12/01/2018. - He has tolerated this cycle very well.  Did not have any problems with nausea vomiting or diarrhea.  No severe fatigue reported.  He will come back in 1 week for his next cycle.  2.  Weight loss: -He is taking Megace twice daily.  He is drinking Ensure for diabetes 3 cans/day. -He is also eating 3 meals per day.  He has not lost any weight.  3.  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  4.  Anxiety: -He is taking Klonopin 1 mg 3 times a day.  This is helping most days.   Total time spent is 25 minutes with  more than 50% of the time spent face-to-face discussing treatment plan, counseling and coordination of care.  Orders placed this encounter:  No orders of the defined types were placed in this encounter.     Derek Jack, MD  [place] [phone]

## 2018-12-13 DIAGNOSIS — C7800 Secondary malignant neoplasm of unspecified lung: Secondary | ICD-10-CM | POA: Diagnosis not present

## 2018-12-13 DIAGNOSIS — C20 Malignant neoplasm of rectum: Secondary | ICD-10-CM | POA: Diagnosis not present

## 2018-12-13 DIAGNOSIS — I2699 Other pulmonary embolism without acute cor pulmonale: Secondary | ICD-10-CM | POA: Diagnosis not present

## 2018-12-13 DIAGNOSIS — Z432 Encounter for attention to ileostomy: Secondary | ICD-10-CM | POA: Diagnosis not present

## 2018-12-13 DIAGNOSIS — C799 Secondary malignant neoplasm of unspecified site: Secondary | ICD-10-CM | POA: Diagnosis not present

## 2018-12-13 DIAGNOSIS — E1165 Type 2 diabetes mellitus with hyperglycemia: Secondary | ICD-10-CM | POA: Diagnosis not present

## 2018-12-13 DIAGNOSIS — E1151 Type 2 diabetes mellitus with diabetic peripheral angiopathy without gangrene: Secondary | ICD-10-CM | POA: Diagnosis not present

## 2018-12-13 DIAGNOSIS — K9189 Other postprocedural complications and disorders of digestive system: Secondary | ICD-10-CM | POA: Diagnosis not present

## 2018-12-13 DIAGNOSIS — Z299 Encounter for prophylactic measures, unspecified: Secondary | ICD-10-CM | POA: Diagnosis not present

## 2018-12-13 DIAGNOSIS — K219 Gastro-esophageal reflux disease without esophagitis: Secondary | ICD-10-CM | POA: Diagnosis not present

## 2018-12-13 DIAGNOSIS — C7889 Secondary malignant neoplasm of other digestive organs: Secondary | ICD-10-CM | POA: Diagnosis not present

## 2018-12-13 DIAGNOSIS — I10 Essential (primary) hypertension: Secondary | ICD-10-CM | POA: Diagnosis not present

## 2018-12-13 DIAGNOSIS — Z6821 Body mass index (BMI) 21.0-21.9, adult: Secondary | ICD-10-CM | POA: Diagnosis not present

## 2018-12-14 DIAGNOSIS — I10 Essential (primary) hypertension: Secondary | ICD-10-CM | POA: Diagnosis not present

## 2018-12-14 DIAGNOSIS — E119 Type 2 diabetes mellitus without complications: Secondary | ICD-10-CM | POA: Diagnosis not present

## 2018-12-15 ENCOUNTER — Inpatient Hospital Stay (HOSPITAL_BASED_OUTPATIENT_CLINIC_OR_DEPARTMENT_OTHER): Payer: Medicare Other | Admitting: Hematology

## 2018-12-15 ENCOUNTER — Other Ambulatory Visit: Payer: Self-pay

## 2018-12-15 ENCOUNTER — Inpatient Hospital Stay (HOSPITAL_COMMUNITY): Payer: Medicare Other

## 2018-12-15 ENCOUNTER — Encounter (HOSPITAL_COMMUNITY): Payer: Self-pay | Admitting: Hematology

## 2018-12-15 VITALS — BP 121/59 | HR 92 | Temp 97.6°F | Resp 18

## 2018-12-15 DIAGNOSIS — C20 Malignant neoplasm of rectum: Secondary | ICD-10-CM

## 2018-12-15 DIAGNOSIS — C7889 Secondary malignant neoplasm of other digestive organs: Secondary | ICD-10-CM | POA: Diagnosis not present

## 2018-12-15 DIAGNOSIS — C7801 Secondary malignant neoplasm of right lung: Secondary | ICD-10-CM | POA: Diagnosis not present

## 2018-12-15 DIAGNOSIS — Z5189 Encounter for other specified aftercare: Secondary | ICD-10-CM | POA: Diagnosis not present

## 2018-12-15 DIAGNOSIS — Z5112 Encounter for antineoplastic immunotherapy: Secondary | ICD-10-CM | POA: Diagnosis not present

## 2018-12-15 DIAGNOSIS — Z5111 Encounter for antineoplastic chemotherapy: Secondary | ICD-10-CM | POA: Diagnosis not present

## 2018-12-15 LAB — COMPREHENSIVE METABOLIC PANEL
ALT: 23 U/L (ref 0–44)
AST: 19 U/L (ref 15–41)
Albumin: 3.3 g/dL — ABNORMAL LOW (ref 3.5–5.0)
Alkaline Phosphatase: 89 U/L (ref 38–126)
Anion gap: 9 (ref 5–15)
BUN: 15 mg/dL (ref 8–23)
CO2: 23 mmol/L (ref 22–32)
Calcium: 8.9 mg/dL (ref 8.9–10.3)
Chloride: 106 mmol/L (ref 98–111)
Creatinine, Ser: 0.66 mg/dL (ref 0.61–1.24)
GFR calc Af Amer: >60 mL/min (ref >60)
GFR calc non Af Amer: >60 mL/min (ref >60)
Glucose, Bld: 225 mg/dL — ABNORMAL HIGH (ref 70–99)
Potassium: 3.9 mmol/L (ref 3.5–5.1)
Sodium: 138 mmol/L (ref 135–145)
Total Bilirubin: 0.6 mg/dL (ref 0.3–1.2)
Total Protein: 6.1 g/dL — ABNORMAL LOW (ref 6.5–8.1)

## 2018-12-15 LAB — CBC WITH DIFFERENTIAL/PLATELET
Abs Immature Granulocytes: 0.05 10*3/uL (ref 0.00–0.07)
Basophils Absolute: 0 10*3/uL (ref 0.0–0.1)
Basophils Relative: 0 %
Eosinophils Absolute: 0.1 10*3/uL (ref 0.0–0.5)
Eosinophils Relative: 2 %
HCT: 37.3 % — ABNORMAL LOW (ref 39.0–52.0)
Hemoglobin: 11.8 g/dL — ABNORMAL LOW (ref 13.0–17.0)
Immature Granulocytes: 1 %
Lymphocytes Relative: 25 %
Lymphs Abs: 1.9 10*3/uL (ref 0.7–4.0)
MCH: 29.4 pg (ref 26.0–34.0)
MCHC: 31.6 g/dL (ref 30.0–36.0)
MCV: 92.8 fL (ref 80.0–100.0)
Monocytes Absolute: 0.6 10*3/uL (ref 0.1–1.0)
Monocytes Relative: 7 %
Neutro Abs: 5.1 10*3/uL (ref 1.7–7.7)
Neutrophils Relative %: 65 %
Platelets: 210 10*3/uL (ref 150–400)
RBC: 4.02 MIL/uL — ABNORMAL LOW (ref 4.22–5.81)
RDW: 15.3 % (ref 11.5–15.5)
WBC: 7.9 10*3/uL (ref 4.0–10.5)
nRBC: 0 % (ref 0.0–0.2)

## 2018-12-15 LAB — SAMPLE TO BLOOD BANK

## 2018-12-15 LAB — MAGNESIUM: Magnesium: 1.9 mg/dL (ref 1.7–2.4)

## 2018-12-15 MED ORDER — FLUOROURACIL CHEMO INJECTION 2.5 GM/50ML
320.0000 mg/m2 | Freq: Once | INTRAVENOUS | Status: AC
  Administered 2018-12-15: 700 mg via INTRAVENOUS
  Filled 2018-12-15: qty 14

## 2018-12-15 MED ORDER — SODIUM CHLORIDE 0.9 % IV SOLN
Freq: Once | INTRAVENOUS | Status: AC
  Administered 2018-12-15: 11:00:00 via INTRAVENOUS

## 2018-12-15 MED ORDER — PALONOSETRON HCL INJECTION 0.25 MG/5ML
0.2500 mg | Freq: Once | INTRAVENOUS | Status: AC
  Administered 2018-12-15: 0.25 mg via INTRAVENOUS
  Filled 2018-12-15: qty 5

## 2018-12-15 MED ORDER — SODIUM CHLORIDE 0.9 % IV SOLN
1920.0000 mg/m2 | INTRAVENOUS | Status: DC
  Administered 2018-12-15: 4100 mg via INTRAVENOUS
  Filled 2018-12-15: qty 82

## 2018-12-15 MED ORDER — SODIUM CHLORIDE 0.9 % IV SOLN
Freq: Once | INTRAVENOUS | Status: AC
  Administered 2018-12-15: 11:00:00 via INTRAVENOUS
  Filled 2018-12-15: qty 5

## 2018-12-15 MED ORDER — SODIUM CHLORIDE 0.9% FLUSH: 10.0000 mL | INTRAVENOUS | Status: DC | PRN

## 2018-12-15 MED ORDER — SODIUM CHLORIDE 0.9 % IV SOLN
90.0000 mg/m2 | Freq: Once | INTRAVENOUS | Status: AC
  Administered 2018-12-15: 200 mg via INTRAVENOUS
  Filled 2018-12-15: qty 10

## 2018-12-15 MED ORDER — SODIUM CHLORIDE 0.9 % IV SOLN
400.0000 mg | Freq: Once | INTRAVENOUS | Status: AC
  Administered 2018-12-15: 12:00:00 400 mg via INTRAVENOUS
  Filled 2018-12-15: qty 16

## 2018-12-15 MED ORDER — ATROPINE SULFATE 1 MG/ML IJ SOLN
0.5000 mg | Freq: Once | INTRAMUSCULAR | Status: AC | PRN
  Administered 2018-12-15: 0.5 mg via INTRAVENOUS
  Filled 2018-12-15: qty 1

## 2018-12-15 MED ORDER — SODIUM CHLORIDE 0.9 % IV SOLN
327.0000 mg/m2 | Freq: Once | INTRAVENOUS | Status: AC
  Administered 2018-12-15: 700 mg via INTRAVENOUS
  Filled 2018-12-15: qty 35

## 2018-12-15 NOTE — Patient Instructions (Addendum)
[facility] at [facility] Discharge Instructions  You were seen today by Dr. Katragadda. He went over your recent lab results. He will see you back in 2 weeks for labs and follow up.   Thank you for choosing [facility] at [facility] to provide your oncology and hematology care.  To afford each patient quality time with our provider, please arrive at least 15 minutes before your scheduled appointment time.   If you have a lab appointment with the Cancer Center please come in thru the  Main Entrance and check in at the main information desk  You need to re-schedule your appointment should you arrive 10 or more minutes late.  We strive to give you quality time with our providers, and arriving late affects you and other patients whose appointments are after yours.  Also, if you no show three or more times for appointments you may be dismissed from the clinic at the providers discretion.     Again, thank you for choosing [facility].  Our hope is that these requests will decrease the amount of time that you wait before being seen by our physicians.       _____________________________________________________________  Should you have questions after your visit to [facility], please contact our office at [phone] between the hours of 8:00 a.m. and 4:30 p.m.  Voicemails left after 4:00 p.m. will not be returned until the following business day.  For prescription refill requests, have your pharmacy contact our office and allow 72 hours.    Cancer Center Support Programs:   > Cancer Support Group  2nd Tuesday of the month 1pm-2pm, Journey Room    

## 2018-12-15 NOTE — Patient Instructions (Signed)
[facility] Discharge Instructions for Patients Receiving Chemotherapy  Today you received the following chemotherapy agents   To help prevent nausea and vomiting after your treatment, we encourage you to take your nausea medication   If you develop nausea and vomiting that is not controlled by your nausea medication, call the clinic.   BELOW ARE SYMPTOMS THAT SHOULD BE REPORTED IMMEDIATELY:  *FEVER GREATER THAN 100.5 F  *CHILLS WITH OR WITHOUT FEVER  NAUSEA AND VOMITING THAT IS NOT CONTROLLED WITH YOUR NAUSEA MEDICATION  *UNUSUAL SHORTNESS OF BREATH  *UNUSUAL BRUISING OR BLEEDING  TENDERNESS IN MOUTH AND THROAT WITH OR WITHOUT PRESENCE OF ULCERS  *URINARY PROBLEMS  *BOWEL PROBLEMS  UNUSUAL RASH Items with * indicate a potential emergency and should be followed up as soon as possible.  Feel free to call the clinic should you have any questions or concerns. The clinic phone number is [phone].  Please show the CHEMO ALERT CARD at check-in to the Emergency Department and triage nurse.   

## 2018-12-15 NOTE — Progress Notes (Signed)
Pt presents today for treatment and f/u visit with Dr. [name]. Labs pending. VS within parameters for treatment.   Treatment given today per MD orders. Tolerated infusion without adverse affects. Vital signs stable. No complaints at this time. 5FU pump infusing per protocol.  Discharged from clinic ambulatory. F/U with [facility] as scheduled.

## 2018-12-15 NOTE — Progress Notes (Signed)
[address] [place], [place] [id number]   CLINIC:  Medical Oncology/Hematology  PCP:  [name], MD [place] [address] [phone]   REASON FOR VISIT:  Follow-up for Stage IV Colon Cancer  CURRENT THERAPY: FOLFIRI  BRIEF ONCOLOGIC HISTORY:  Oncology History  Rectal cancer ([place])  07/24/2010 Initial Diagnosis   Invasive adenocarcinoma of rectum   09/09/2010 Concurrent Chemotherapy   S/P radiation and concurrent 5-FU continuous infusion from 09/09/10- 10/10/10.   11/14/2010 Surgery   S/P proctectomy with colorectal anastomosis and diverting loop ileostomy on 11/14/10 at [facility] by Dr. Harlon Ditty. Pathology reveals a pT3b N1 with 3/20 lymph nodes.   02/05/2011 - 07/14/2011 Chemotherapy   FOLFOX   08/18/2011 Surgery   Approximate date of surgery- [place] by Dr. Harlon Ditty    Remission     11/24/2013 Survivorship   Colonoscopy- somewhat fibrotic/friable anastomotic mucosal-status post biopsy (narrowing not felt to be clinically significant).  Negative pathology for malignancy   09/12/2014 Imaging   DVT in the left femoral venous system, left common iliac vein, IVC, and within the IVC filter Right lower extremity venography confirms chronic occlusion of the femoral venous system with collateralization. The right iliac venous system is patent and do   09/25/2014 PET scan   The right middle lobe pulmonary nodule is hypermetabolic, favored to represent a primary bronchogenic carcinoma.Equivocal mediastinal nodes, similar to surrounding blood pool. Bilateral adrenal hypermetabolism, felt to be physiologic   10/24/2014 Pathology Results   Lung, needle/core biopsy(ies), RML - ADENOCARCINOMA, SEE COMMENT metastatic adenocarcinoma of a colorectal primary   10/24/2014 Relapse/Recurrence     11/16/2014 Definitive Surgery   Bronchoscopy, right video-assisted thoracoscopy, wedge resection of right middle lobe by Dr. Servando Snare   11/16/2014  Pathology Results   Lung, wedge biopsy/resection, right lingula and small portion of middle lobe - METASTATIC ADENOCARCINOMA, CONSISTENT WITH COLORECTAL PRIMARY, SPANNING 2.0 CM. - THE SURGICAL RESECTION MARGINS ARE NEGATIVE FOR ADENOCARCINOMA.   11/16/2014 Remission   THE SURGICAL RESECTION MARGINS ARE NEGATIVE FOR ADENOCARCINOMA.   03/07/2015 Imaging   CT CAP- Interval resection of right middle lobe metastasis. No acute process or evidence of metastatic disease in the chest, abdomen or pelvis. Improved right upper lobe reticular nodular opacities are favored to represent resolving infection.   09/14/2015 Imaging   CT CAP- No findings of recurrent malignancy. No recurrence along the wedge resection site of the right middle lobe. Right anterior abdominal wall focal hernia containing a knuckle of small bowel without complicating feature.   06/13/2016 Imaging   CT CAP- 1. New right-sided pleural metastasis/mass. Other smaller right-sided pulmonary nodules which are all pleural-based and most likely represent pleural metastasis. 2. No convincing evidence of abdominopelvic nodal metastasis. 3. Constellation of findings, including pancreatic atrophy, duct dilatation, and pancreatic head soft tissue fullness which are highly suspicious for pancreatic adenocarcinoma. Metastatic disease felt much less likely. Consider endoscopic ultrasound sampling or ERCP. Cannot exclude superimposed acute pancreatitis. 4. New enlargement of the appendiceal tip with subtle surrounding edema. Cannot exclude early or mild appendicitis. 5. Coronary artery atherosclerosis. Aortic atherosclerosis. 6. 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   06/13/2016 Progression   CT scan demonstrates progression of disease   06/19/2016 Procedure   EUS with FNA by Dr. Ardis Hughs   06/20/2016 Pathology Results   FINE NEEDLE ASPIRATION, ENDOSCOPIC, PANCREAS UNCINATE AREA(SPECIMEN 1 OF 1 COLLECTED 06/19/16):  MALIGNANT CELLS CONSISTENT WITH METASTATIC ADENOCARCINOMA.  06/25/2016 PET scan   1. Two pleural-based nodules in the right hemithorax are hypermetabolic. Metastatic disease is a distinct consideration. The scattered pulmonary parenchymal nodule seen on previous diagnostic CT imaging are below the threshold for reliable resolution on PET imaging. 2. Hypermetabolic lesion pancreatic head, consistent with neoplasm. As noted on prior CT, adenocarcinoma is any consideration. No evidence for hypermetabolic abdominal lymphadenopathy. Mottled uptake noted in the liver, but no discrete hepatic metastases are evident on PET imaging. 3. Appendix remains distended up to 0.9-10 mm diameter with a stone towards the tip in subtle periappendiceal edema/inflammation. The appendix is hypermetabolic along its length. Imaging features are relatively stable in the 12 day interval since prior CT scan. While appendicitis is a consideration, the relative stability over 12 days would be unusual for that etiology. Continued close follow-up recommended.   06/26/2016 Pathology Results   Not enough tissue for foundationONE or K-ras testing.   07/07/2016 Procedure   Port placed by Dr. Arnoldo Morale   07/08/2016 -  Chemotherapy   The patient had pegfilgrastim (NEULASTA) injection 6 mg, 6 mg, Subcutaneous,  Once, 0 of 4 cycles  ondansetron (ZOFRAN) IVPB 8 mg, 8 mg, Intravenous,  Once, 0 of 4 cycles  leucovorin 804 mg in dextrose 5 % 250 mL infusion, 400 mg/m2, Intravenous,  Once, 0 of 4 cycles  oxaliplatin (ELOXATIN) 170 mg in dextrose 5 % 500 mL chemo infusion, 85 mg/m2, Intravenous,  Once, 0 of 4 cycles  fluorouracil (ADRUCIL) 4,800 mg in sodium chloride 0.9 % 150 mL chemo infusion, 2,400 mg/m2 = 4,800 mg, Intravenous, 1 Day/Dose, 0 of 4 cycles  fluorouracil (ADRUCIL) chemo injection 800 mg, 400 mg/m2, Intravenous,  Once, 0 of 4 cycles  pegfilgrastim (NEULASTA) injection 6 mg, 6 mg, Subcutaneous,  Once, 2 of 2  cycles  ondansetron (ZOFRAN) IVPB 8 mg, 8 mg, Intravenous,  Once, 12 of 12 cycles  leucovorin 660 mg in dextrose 5 % 250 mL infusion, 660 mg (original dose ), Intravenous,  Once, 12 of 12 cycles Dose modification: 660 mg (Cycle 1)  oxaliplatin (ELOXATIN) 140 mg in dextrose 5 % 500 mL chemo infusion, 140 mg (original dose ), Intravenous,  Once, 12 of 12 cycles Dose modification: 140 mg (Cycle 1), 119 mg (85 % of original dose 140 mg, Cycle 8, Reason: Provider Judgment)  fluorouracil (ADRUCIL) 3,850 mg in sodium chloride 0.9 % 150 mL chemo infusion, 3,850 mg (original dose ), Intravenous, 1 Day/Dose, 12 of 12 cycles Dose modification: 3,850 mg (Cycle 1)  fluorouracil (ADRUCIL) chemo injection 700 mg, 700 mg (original dose ), Intravenous,  Once, 12 of 12 cycles Dose modification: 700 mg (Cycle 1)  palonosetron (ALOXI) injection 0.25 mg, 0.25 mg, Intravenous,  Once, 7 of 12 cycles Administration: 0.25 mg (09/30/2016)  bevacizumab (AVASTIN) 475 mg in sodium chloride 0.9 % 100 mL chemo infusion, 5 mg/kg = 475 mg, Intravenous,  Once, 6 of 11 cycles Administration: 500 mg (09/30/2016)  leucovorin 880 mg in dextrose 5 % 250 mL infusion, 400 mg/m2 = 880 mg, Intravenous,  Once, 7 of 12 cycles Administration: 880 mg (09/30/2016)  oxaliplatin (ELOXATIN) 185 mg in dextrose 5 % 500 mL chemo infusion, 85 mg/m2 = 185 mg, Intravenous,  Once, 7 of 12 cycles Administration: 185 mg (09/30/2016)  fluorouracil (ADRUCIL) chemo injection 900 mg, 400 mg/m2 = 900 mg, Intravenous,  Once, 7 of 12 cycles Administration: 900 mg (09/30/2016)  fluorouracil (ADRUCIL) 5,300 mg in sodium chloride 0.9 % 144 mL chemo infusion, 2,400 mg/m2 = 5,300  mg, Intravenous, 1 Day/Dose, 7 of 12 cycles Administration: 5,300 mg (09/30/2016)  for chemotherapy treatment.     08/27/2016 Imaging   CT CAP- 1. Pulmonary metastatic disease is stable. 2. Pancreatic head mass, grossly stable, with progressive atrophy of the body and tail of the  pancreas. Stable peripancreatic lymph node. 3. Mild circumferential rectal wall thickening, stable. 4. Aortic atherosclerosis (ICD10-170.0). Coronary artery calcification. 5. Hyperattenuating lesion off the lower pole left kidney, stable, too small to characterize. Continued attention on followup exams is warranted. 6. Persistent wall thickening and mild dilatation of the proximal appendix, of uncertain etiology. Continued attention on followup exams is warranted.   11/06/2016 Imaging   CT C/A/P: IMPRESSION: 1. Stable pulmonary metastatic disease. 2. Similar appearing pancreatic head mass. Progressed atrophy of the pancreatic body and tail. Stable peripancreatic lymph node. 3. Stable hypoattenuating lesion partially exophytic off the inferior pole of the left kidney. 4. Aortic atherosclerosis. 5. Persistent mild wall thickening and mild dilatation of the appendix.    01/30/2017 Imaging   Ct C/A/P: IMPRESSION: 1. Stable pulmonary metastatic disease. 2. Stable to slight increase in size of pancreatic head mass. 3.  Aortic Atherosclerosis (ICD10-I70.0). 4. Similar appearance of mild wall thickening of the appendix which contains an appendicolith.   05/01/2017 Imaging   CT C/A/P: Stable disease in the lungs, no progression of disease in the abdomen.   10/04/2018 -  Chemotherapy   The patient had palonosetron (ALOXI) injection 0.25 mg, 0.25 mg, Intravenous,  Once, 4 of 4 cycles Administration: 0.25 mg (10/04/2018), 0.25 mg (10/25/2018), 0.25 mg (12/01/2018), 0.25 mg (12/15/2018) pegfilgrastim (NEULASTA) injection 6 mg, 6 mg, Subcutaneous, Once, 2 of 2 cycles Administration: 6 mg (10/27/2018) pegfilgrastim-cbqv (UDENYCA) injection 6 mg, 6 mg, Subcutaneous, Once, 2 of 2 cycles Administration: 6 mg (12/03/2018) bevacizumab (AVASTIN) 450 mg in sodium chloride 0.9 % 100 mL chemo infusion, 5 mg/kg = 450 mg, Intravenous,  Once, 3 of 3 cycles Administration: 400 mg (12/01/2018), 400 mg (12/15/2018)  irinotecan (CAMPTOSAR) 380 mg in sodium chloride 0.9 % 500 mL chemo infusion, 180 mg/m2 = 380 mg, Intravenous,  Once, 4 of 4 cycles Dose modification: 144 mg/m2 (original dose 180 mg/m2, Cycle 2, Reason: Provider Judgment, Comment: 20% dose reduction), 90 mg/m2 (50 % of original dose 180 mg/m2, Cycle 3, Reason: Other (see comments), Comment: [id number]) Administration: 380 mg (10/04/2018), 300 mg (10/25/2018), 200 mg (12/01/2018), 200 mg (12/15/2018) leucovorin 900 mg in sodium chloride 0.9 % 250 mL infusion, 421 mg/m2 = 856 mg, Intravenous,  Once, 4 of 4 cycles Dose modification: 320 mg/m2 (original dose 400 mg/m2, Cycle 2, Reason: Provider Judgment, Comment: 20% dose reduction) Administration: 900 mg (10/04/2018), 700 mg (10/25/2018), 700 mg (12/01/2018), 700 mg (12/15/2018) fluorouracil (ADRUCIL) chemo injection 850 mg, 400 mg/m2 = 850 mg, Intravenous,  Once, 4 of 4 cycles Dose modification: 320 mg/m2 (original dose 400 mg/m2, Cycle 2, Reason: Provider Judgment, Comment: 20% dose reduction) Administration: 850 mg (10/04/2018), 700 mg (10/25/2018), 700 mg (12/01/2018) fosaprepitant (EMEND) 150 mg, dexamethasone (DECADRON) 12 mg in sodium chloride 0.9 % 145 mL IVPB, , Intravenous,  Once, 2 of 2 cycles Administration:  (12/01/2018),  (12/15/2018) fluorouracil (ADRUCIL) 5,000 mg in sodium chloride 0.9 % 150 mL chemo infusion, 2,325 mg/m2 = 5,150 mg, Intravenous, 1 Day/Dose, 4 of 4 cycles Dose modification: 1,920 mg/m2 (original dose 2,400 mg/m2, Cycle 2, Reason: Provider Judgment, Comment: 20% dose reduction) Administration: 5,000 mg (10/04/2018), 4,100 mg (10/25/2018), 4,100 mg (12/01/2018)  for chemotherapy treatment.  CANCER STAGING: Cancer Staging Rectal cancer ([place]) Staging form: Colon and Rectum, AJCC 7th Edition - Clinical: Stage IIIB (T3, N1, M0) - Signed by [name], MD on 02/04/2011 - Pathologic stage from 11/17/2014: Stage IVA (M1a) - Signed by Baird Cancer, PA-C on 09/14/2015     INTERVAL HISTORY:  Mr. Antillon 68 y.o. male seen for follow-up and next cycle of chemotherapy.  Last cycle was on 12/01/2018.  He did very well without any nausea vomiting diarrhea or constipation.  Colostomy output is stable.  Appetite and energy levels are 50%.  He is eating 3 meals a day.  He is also drinking 4 cans of boost per day.  Numbness in the feet has been stable.  Very mild dizziness on standing present.  REVIEW OF SYSTEMS:  Review of Systems  Neurological: Positive for dizziness and numbness.  All other systems reviewed and are negative.    PAST MEDICAL/SURGICAL HISTORY:  Past Medical History:  Diagnosis Date  . Chronic anticoagulation   . Colon cancer ([place]) 07/24/2010   rectal ca, inv adenocarcinoma  . Depression 04/01/2011  . Diabetes mellitus without complication ([place])   . DVT (deep venous thrombosis) ([place]) 05/09/2011  . GERD (gastroesophageal reflux disease)   . High output ileostomy ([place]) 05/21/2011  . History of kidney stones   . HTN (hypertension)   . Hx of radiation therapy 09/02/10 to 10/14/10   pelvis  . Lung metastasis ([place])   . Neuropathy   . Peripheral vascular disease ([place])    dvt's,pe  . Pneumonia    hx x3  . Pulmonary embolism ([place])   . Rectal cancer ([place]) 01/15/2011   S/P radiation and concurrent 5-FU continuous infusion from 09/09/10- 10/10/10.  S/P proctectomy with colorectal anastomosis and diverting loop ileostomy on 11/14/10 at [facility] by Dr. Harlon Ditty. Pathology reveals a pT3b N1 with 3/20 lymph nodes.     Past Surgical History:  Procedure Laterality Date  . BILIARY STENT PLACEMENT N/A 09/16/2018   Procedure: STENT PLACEMENT;  Surgeon: Rogene Houston, MD;  [facility];  Service: Endoscopy;  Laterality: N/A;  . BIOPSY  09/24/2018   Procedure: BIOPSY;  Surgeon: Danie Binder, MD;  [facility];  Service: Endoscopy;;  . COLON SURGERY  11/14/2010   proctectomy with colorectal anastomosis and diverting loop ileostomy  (temporary planned)  . COLONOSCOPY  07/2010   proximal rectal apple core mass 10-14cm from anal verge (adenocarcinoma), 2-3cm distal rectal carpet polyp s/p piecemeal snare polypectomy (adenoma)  . COLONOSCOPY  04/21/2012   RMR: Friable,fibrotic appearing colorectal anastomosis producing some luminal narrowing-not felt to be critical. path: focal erosion with slight inflammation and hyperemia. SURVEILLANCE DUE DEC 2015  . COLONOSCOPY N/A 11/24/2013   Dr. Rourk:somewhat fibrotic/friable anastomotic mucosal-status post biopsy (narrowing not felt to be clinically significant) Single colonic diverticulum. benign polypoid rectal mucosa  . colostomy reversal  april 2013  . ERCP N/A 09/16/2018   Procedure: ENDOSCOPIC RETROGRADE CHOLANGIOPANCREATOGRAPHY (ERCP);  Surgeon: Rogene Houston, MD;  [facility];  Service: Endoscopy;  Laterality: N/A;  . ESOPHAGOGASTRODUODENOSCOPY  07/2010   RMR: schatki ring s/p dilation, small hh, SB bx benign  . ESOPHAGOGASTRODUODENOSCOPY N/A 09/04/2015   Procedure: ESOPHAGOGASTRODUODENOSCOPY (EGD);  Surgeon: Daneil Dolin, MD;  [facility];  Service: Endoscopy;  Laterality: N/A;  730  . EUS  08/2010   Dr. Owens Loffler. uT3N0 circumferential, nearly obstruction rectosigmoid adenocarcinoma, distal edge 12cm from anal verge  . EUS N/A  06/19/2016   Procedure: UPPER ENDOSCOPIC ULTRASOUND (EUS) RADIAL;  Surgeon: Milus Banister, MD;  Location: [facility];  Service: Endoscopy;  Laterality: N/A;  . FLEXIBLE SIGMOIDOSCOPY N/A 09/24/2018   Procedure: FLEXIBLE SIGMOIDOSCOPY;  Surgeon: Danie Binder, MD;  [facility];  Service: Endoscopy;  Laterality: N/A;  . HERNIA REPAIR     abd hernia repair  . IVC filter    . ivc filter    . port a cath placement    . PORT-A-CATH REMOVAL  09/24/2011   Procedure: REMOVAL PORT-A-CATH;  Surgeon: Donato Heinz, MD;  Location: [facility];  Service: General;  Laterality: N/A;  Minor Room  . PORTACATH PLACEMENT Right  07/07/2016   Procedure: INSERTION PORT-A-CATH;  Surgeon: Aviva Signs, MD;  Location: [facility];  Service: General;  Laterality: Right;  . SPHINCTEROTOMY N/A 09/16/2018   Procedure: SPHINCTEROTOMY with balloon dialation;  Surgeon: Rogene Houston, MD;  [facility];  Service: Endoscopy;  Laterality: N/A;  . TRANSVERSE LOOP COLOSTOMY N/A 09/29/2018   Procedure: TRANSVERSE LOOP COLOSTOMY;  Surgeon: Aviva Signs, MD;  Location: [facility];  Service: General;  Laterality: N/A;  . VIDEO ASSISTED THORACOSCOPY (VATS)/WEDGE RESECTION Right 11/15/2014   Procedure: VIDEO ASSISTED THORACOSCOPY (VATS)/LUNG RESECTION WITH RIGHT LINGULECTOMY;  Surgeon: [name], MD;  Location: [facility];  Service: Thoracic;  Laterality: Right;  Marland Kitchen VIDEO BRONCHOSCOPY N/A 11/15/2014   Procedure: VIDEO BRONCHOSCOPY;  Surgeon: [name], MD;  Location: [facility] OR;  Service: Thoracic;  Laterality: N/A;     SOCIAL HISTORY:  Social History   Socioeconomic History  . Marital status: Married    Spouse name: Not on file  . Number of children: 2  . Years of education: Not on file  . Highest education level: Not on file  Occupational History  . Occupation: self-employed Regulatory affairs officer, currently not working    Fish farm manager: SELF EMPLOYED  Social Needs  . Financial resource strain: Not very hard  . Food insecurity    Worry: Never true    Inability: Never true  . Transportation needs    Medical: No    Non-medical: No  Tobacco Use  . Smoking status: Never Smoker  . Smokeless tobacco: Never Used  Substance and Sexual Activity  . Alcohol use: No  . Drug use: No  . Sexual activity: Yes    Birth control/protection: None    Comment: married  Lifestyle  . Physical activity    Days per week: 0 days    Minutes per session: 0 min  . Stress: Only a little  Relationships  . Social connections    Talks on phone: More than three times a week    Gets together: Once a week    Attends religious service:  Patient refused    Active member of club or organization: Patient refused    Attends meetings of clubs or organizations: Patient refused    Relationship status: Patient refused  . Intimate partner violence    Fear of current or ex partner: Patient refused    Emotionally abused: Patient refused    Physically abused: Patient refused    Forced sexual activity: Patient refused  Other Topics Concern  . Not on file  Social History Narrative  . Not on file    FAMILY HISTORY:  Family History  Problem Relation Age of Onset  . Cancer Brother        throat  . Cancer Brother        prostate  .  Colon cancer Neg Hx   . Liver disease Neg Hx   . Inflammatory bowel disease Neg Hx     CURRENT MEDICATIONS:  Outpatient Encounter Medications as of 12/15/2018  Medication Sig  . acetaminophen (TYLENOL) 500 MG tablet Take 1,000 mg by mouth every 6 (six) hours as needed for mild pain or moderate pain.   . clonazePAM (KLONOPIN) 1 MG tablet Take 1 tablet (1 mg total) by mouth 3 (three) times daily as needed.  . Diphenhyd-Hydrocort-Nystatin (FIRST-DUKES MOUTHWASH) SUSP Use as directed 5 mLs in the mouth or throat 4 (four) times daily.  . diphenoxylate-atropine (LOMOTIL) 2.5-0.025 MG tablet Take 1 tablet by mouth 4 (four) times daily as needed for diarrhea or loose stools.  Marland Kitchen dronabinol (MARINOL) 5 MG capsule Take 1 capsule (5 mg total) by mouth 2 (two) times daily before a meal.  . gabapentin (NEURONTIN) 300 MG capsule Take 3 capsules (900 mg total) by mouth 2 (two) times daily.  Marland Kitchen glimepiride (AMARYL) 2 MG tablet Take 2 mg by mouth 2 (two) times daily before a meal.   . hydrocortisone (CORTEF) 20 MG tablet Take 20 mg by mouth daily.   Marland Kitchen levofloxacin (LEVAQUIN) 500 MG tablet Take 1 tablet (500 mg total) by mouth daily.  . megestrol (MEGACE) 400 MG/10ML suspension Take 10 mLs (400 mg total) by mouth 2 (two) times daily.  . mirtazapine (REMERON) 7.5 MG tablet Take 7.5 mg by mouth at bedtime.   Marland Kitchen omeprazole  (PRILOSEC) 20 MG capsule Take 20 mg by mouth 2 (two) times daily before a meal.   . ondansetron (ZOFRAN) 8 MG tablet Take 1 tablet (8 mg total) by mouth 2 (two) times daily as needed for refractory nausea / vomiting. Start on day 3 after chemotherapy.  Marland Kitchen oxyCODONE-acetaminophen (PERCOCET) 10-325 MG tablet Take 1 tablet by mouth every 6 (six) hours as needed for pain.  . pantoprazole (PROTONIX) 40 MG tablet   . Probiotic Product (PROBIOTIC DAILY PO) Take 1 capsule by mouth daily.  . prochlorperazine (COMPAZINE) 10 MG tablet Take 1 tablet (10 mg total) by mouth every 6 (six) hours as needed for nausea or vomiting.  . promethazine (PHENERGAN) 25 MG tablet Take 1 tablet (25 mg total) by mouth every 8 (eight) hours as needed for refractory nausea / vomiting.  . rivaroxaban (XARELTO) 20 MG TABS tablet Take 1 tablet (20 mg total) by mouth daily with supper.  . traZODone (DESYREL) 100 MG tablet Take 1 tablet (100 mg total) by mouth at bedtime.  . vitamin B-12 (CYANOCOBALAMIN) 1000 MCG tablet Take 1,000 mcg by mouth daily.   Facility-Administered Encounter Medications as of 12/15/2018  Medication  . sodium chloride 0.9 % injection 10 mL  . sodium chloride flush (NS) 0.9 % injection 10 mL    ALLERGIES:  Allergies  Allergen Reactions  . Oxycodone     Blisters, hallucinations  . Tramadol     Blisters, hallucinations  . Trazodone And Nefazodone Other (See Comments)    hallucinations     PHYSICAL EXAM:  ECOG Performance status: 2  Vitals:   12/15/18 0909  BP: 124/67  Pulse: 100  Resp: 18  Temp: (!) 97.1 F (36.2 C)  SpO2: 100%   Filed Weights   12/15/18 0909  Weight: 168 lb 6.4 oz (76.4 kg)    Physical Exam Constitutional:      Appearance: Normal appearance.  HENT:     Head: Normocephalic.     Nose: Nose normal.     Mouth/Throat:  Mouth: Mucous membranes are moist.     Pharynx: Oropharynx is clear.  Eyes:     Extraocular Movements: Extraocular movements intact.      Conjunctiva/sclera: Conjunctivae normal.  Neck:     Musculoskeletal: Normal range of motion.  Cardiovascular:     Rate and Rhythm: Regular rhythm. Tachycardia present.     Pulses: Normal pulses.     Heart sounds: Normal heart sounds.  Pulmonary:     Effort: Pulmonary effort is normal.     Breath sounds: Normal breath sounds.  Abdominal:     General: Abdomen is flat. Bowel sounds are normal.     Palpations: Abdomen is soft.  Musculoskeletal: Normal range of motion.  Skin:    General: Skin is warm and dry.  Neurological:     General: No focal deficit present.     Mental Status: He is alert and oriented to person, place, and time.  Psychiatric:        Mood and Affect: Mood normal.        Behavior: Behavior normal.        Thought Content: Thought content normal.        Judgment: Judgment normal.      LABORATORY DATA:  I have reviewed the labs as listed.  CBC    Component Value Date/Time   WBC 7.9 12/15/2018 0852   RBC 4.02 (L) 12/15/2018 0852   HGB 11.8 (L) 12/15/2018 0852   HGB 11.6 (L) 02/04/2011 1059   HCT 37.3 (L) 12/15/2018 0852   HCT 34.7 (L) 02/04/2011 1059   PLT 210 12/15/2018 0852   PLT 282 02/04/2011 1059   MCV 92.8 12/15/2018 0852   MCV 76.0 (L) 02/04/2011 1059   MCH 29.4 12/15/2018 0852   MCHC 31.6 12/15/2018 0852   RDW 15.3 12/15/2018 0852   RDW 18.5 (H) 02/04/2011 1059   LYMPHSABS 1.9 12/15/2018 0852   LYMPHSABS 1.1 02/04/2011 1059   MONOABS 0.6 12/15/2018 0852   MONOABS 0.5 02/04/2011 1059   EOSABS 0.1 12/15/2018 0852   EOSABS 0.1 02/04/2011 1059   BASOSABS 0.0 12/15/2018 0852   BASOSABS 0.1 02/04/2011 1059   CMP Latest Ref Rng & Units 12/15/2018 12/08/2018 12/01/2018  Glucose 70 - 99 mg/dL 225(H) 282(H) 151(H)  BUN 8 - 23 mg/dL '15 14 16  '$ Creatinine 0.61 - 1.24 mg/dL 0.66 0.76 0.63  Sodium 135 - 145 mmol/L 138 135 136  Potassium 3.5 - 5.1 mmol/L 3.9 4.4 4.1  Chloride 98 - 111 mmol/L 106 99 105  CO2 22 - 32 mmol/L '23 25 24  '$ Calcium 8.9 - 10.3  mg/dL 8.9 8.9 9.0  Total Protein 6.5 - 8.1 g/dL 6.1(L) 6.3(L) 6.5  Total Bilirubin 0.3 - 1.2 mg/dL 0.6 1.3(H) 1.2  Alkaline Phos 38 - 126 U/L 89 110 95  AST 15 - 41 U/L '19 16 21  '$ ALT 0 - 44 U/L '23 20 29    '$ I have personally reviewed the scans and discussed with the patient.   ASSESSMENT & PLAN:   Rectal cancer 1.  Stage IV rectal cancer with lung and pancreatic metastasis: Foundation 1 CDX shows KRAS G13D and NRAS wild type, MS-stable. -12 cycles of FOLFOX in the adjuvant setting from October 12 through 07/14/2011. -15 cycles of FOLFOX and bevacizumab from 07/08/2016 through 01/20/2017 followed by infusional 5-FU and bevacizumab until 07/14/2017. -Maintenance Xeloda from 08/07/2017 with bevacizumab through 08/19/2018 with progression. - ERCP and stent placement on 09/16/2018 showing single localized biliary stricture.  Biliary sphincterotomy was  performed with stricture dilated to 6 mm with balloon to facilitate stent placement.  1 covered metal stent was placed. -Flexible sigmoidoscopy on 09/24/2018 showing narrowing of the colorectal anastomosis.  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 - 2 cycles of FOLFIRI from 10/04/2018 through 10/25/2018 with 20% dose reduction during cycle 2. - He has developed severe tiredness after cycle 2 even after dose reduction. 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  - Cycle 3 chemotherapy with 50% dose reduction of 5-FU taken on 12/01/2018.  - He has tolerated chemotherapy very well.  We reviewed blood work. -he will proceed with cycle 4 with the same dose of Irinotecan. -I will see him back in 2 weeks for follow-up.  2.  Weight loss: -He is taking Megace twice daily.  He is drinking Ensure for diabetes 4 cans/day. -He is also eating better 3 times a day.  3.  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  4.  Anxiety: -He  takes Klonopin 1 mg twice daily.   Total time spent is 25 minutes with more than 50% of the time spent face-to-face discussing treatment plan, counseling and coordination of care.  Orders placed this encounter:  No orders of the defined types were placed in this encounter.     Derek Jack, MD  [place] [phone]

## 2018-12-16 NOTE — Assessment & Plan Note (Signed)
1.  Stage IV rectal cancer with lung and pancreatic metastasis: Foundation 1 CDX shows KRAS G13D and NRAS wild type, MS-stable. -12 cycles of FOLFOX in the adjuvant setting from October 12 through 07/14/2011. -15 cycles of FOLFOX and bevacizumab from 07/08/2016 through 01/20/2017 followed by infusional 5-FU and bevacizumab until 07/14/2017. -Maintenance Xeloda from 08/07/2017 with bevacizumab through 08/19/2018 with progression. - ERCP and stent placement on 09/16/2018 showing single localized biliary stricture.  Biliary sphincterotomy was performed with stricture dilated to 6 mm with balloon to facilitate stent placement.  1 covered metal stent was placed. -Flexible sigmoidoscopy on 09/24/2018 showing narrowing of the colorectal anastomosis.  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 - 2 cycles of FOLFIRI from 10/04/2018 through 10/25/2018 with 20% dose reduction during cycle 2. - He has developed severe tiredness after cycle 2 even after dose reduction. 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  - Cycle 3 chemotherapy with 50% dose reduction of 5-FU taken on 12/01/2018.  - He has tolerated chemotherapy very well.  We reviewed blood work. -he will proceed with cycle 4 with the same dose of Irinotecan. -I will see him back in 2 weeks for follow-up.  2.  Weight loss: -He is taking Megace twice daily.  He is drinking Ensure for diabetes 4 cans/day. -He is also eating better 3 times a day.  3.  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  4.  Anxiety: -He takes Klonopin 1 mg twice daily.

## 2018-12-17 ENCOUNTER — Inpatient Hospital Stay (HOSPITAL_COMMUNITY): Payer: Medicare Other

## 2018-12-17 ENCOUNTER — Other Ambulatory Visit: Payer: Self-pay

## 2018-12-17 VITALS — BP 126/65 | HR 82 | Temp 98.6°F

## 2018-12-17 DIAGNOSIS — Z5112 Encounter for antineoplastic immunotherapy: Secondary | ICD-10-CM | POA: Diagnosis not present

## 2018-12-17 DIAGNOSIS — C7889 Secondary malignant neoplasm of other digestive organs: Secondary | ICD-10-CM | POA: Diagnosis not present

## 2018-12-17 DIAGNOSIS — C7801 Secondary malignant neoplasm of right lung: Secondary | ICD-10-CM | POA: Diagnosis not present

## 2018-12-17 DIAGNOSIS — Z5189 Encounter for other specified aftercare: Secondary | ICD-10-CM | POA: Diagnosis not present

## 2018-12-17 DIAGNOSIS — Z5111 Encounter for antineoplastic chemotherapy: Secondary | ICD-10-CM | POA: Diagnosis not present

## 2018-12-17 DIAGNOSIS — C20 Malignant neoplasm of rectum: Secondary | ICD-10-CM | POA: Diagnosis not present

## 2018-12-17 MED ORDER — HEPARIN SOD (PORK) LOCK FLUSH 100 UNIT/ML IV SOLN
500.0000 [IU] | Freq: Once | INTRAVENOUS | Status: AC | PRN
  Administered 2018-12-17: 500 [IU]

## 2018-12-17 MED ORDER — SODIUM CHLORIDE 0.9% FLUSH
10.0000 mL | INTRAVENOUS | Status: DC | PRN
  Administered 2018-12-17: 10 mL
  Filled 2018-12-17: qty 10

## 2018-12-17 MED ORDER — PEGFILGRASTIM-CBQV 6 MG/0.6ML ~~LOC~~ SOSY
6.0000 mg | PREFILLED_SYRINGE | Freq: Once | SUBCUTANEOUS | Status: AC
  Administered 2018-12-17: 6 mg via SUBCUTANEOUS
  Filled 2018-12-17: qty 0.6

## 2018-12-17 NOTE — Progress Notes (Signed)
Patients port flushed without difficulty.  Good blood return noted before and after flush. No complaints of pain with flush and no bruising or swelling noted at site.  Band aid applied.  VSS with discharge and left ambulatory with no s/s of distress noted.

## 2018-12-22 ENCOUNTER — Other Ambulatory Visit (HOSPITAL_COMMUNITY): Payer: Self-pay | Admitting: Hematology

## 2018-12-22 DIAGNOSIS — F419 Anxiety disorder, unspecified: Secondary | ICD-10-CM

## 2018-12-22 DIAGNOSIS — Z432 Encounter for attention to ileostomy: Secondary | ICD-10-CM | POA: Diagnosis not present

## 2018-12-22 DIAGNOSIS — K9189 Other postprocedural complications and disorders of digestive system: Secondary | ICD-10-CM | POA: Diagnosis not present

## 2018-12-22 DIAGNOSIS — C20 Malignant neoplasm of rectum: Secondary | ICD-10-CM

## 2018-12-22 DIAGNOSIS — C7800 Secondary malignant neoplasm of unspecified lung: Secondary | ICD-10-CM | POA: Diagnosis not present

## 2018-12-22 DIAGNOSIS — C7889 Secondary malignant neoplasm of other digestive organs: Secondary | ICD-10-CM | POA: Diagnosis not present

## 2018-12-22 DIAGNOSIS — E1151 Type 2 diabetes mellitus with diabetic peripheral angiopathy without gangrene: Secondary | ICD-10-CM | POA: Diagnosis not present

## 2018-12-28 DIAGNOSIS — C20 Malignant neoplasm of rectum: Secondary | ICD-10-CM | POA: Diagnosis not present

## 2018-12-28 DIAGNOSIS — C7800 Secondary malignant neoplasm of unspecified lung: Secondary | ICD-10-CM | POA: Diagnosis not present

## 2018-12-28 DIAGNOSIS — Z432 Encounter for attention to ileostomy: Secondary | ICD-10-CM | POA: Diagnosis not present

## 2018-12-28 DIAGNOSIS — K9189 Other postprocedural complications and disorders of digestive system: Secondary | ICD-10-CM | POA: Diagnosis not present

## 2018-12-28 DIAGNOSIS — E1151 Type 2 diabetes mellitus with diabetic peripheral angiopathy without gangrene: Secondary | ICD-10-CM | POA: Diagnosis not present

## 2018-12-28 DIAGNOSIS — C7889 Secondary malignant neoplasm of other digestive organs: Secondary | ICD-10-CM | POA: Diagnosis not present

## 2018-12-29 ENCOUNTER — Other Ambulatory Visit: Payer: Self-pay

## 2018-12-29 ENCOUNTER — Encounter (HOSPITAL_COMMUNITY): Payer: Self-pay | Admitting: Hematology

## 2018-12-29 ENCOUNTER — Inpatient Hospital Stay (HOSPITAL_COMMUNITY): Payer: Medicare Other

## 2018-12-29 ENCOUNTER — Encounter (HOSPITAL_COMMUNITY): Payer: Self-pay

## 2018-12-29 ENCOUNTER — Inpatient Hospital Stay (HOSPITAL_BASED_OUTPATIENT_CLINIC_OR_DEPARTMENT_OTHER): Payer: Medicare Other | Admitting: Hematology

## 2018-12-29 VITALS — BP 112/66 | HR 88 | Temp 97.6°F | Resp 18

## 2018-12-29 DIAGNOSIS — C20 Malignant neoplasm of rectum: Secondary | ICD-10-CM | POA: Diagnosis not present

## 2018-12-29 DIAGNOSIS — Z5111 Encounter for antineoplastic chemotherapy: Secondary | ICD-10-CM | POA: Diagnosis not present

## 2018-12-29 DIAGNOSIS — C7889 Secondary malignant neoplasm of other digestive organs: Secondary | ICD-10-CM | POA: Diagnosis not present

## 2018-12-29 DIAGNOSIS — C7801 Secondary malignant neoplasm of right lung: Secondary | ICD-10-CM | POA: Diagnosis not present

## 2018-12-29 DIAGNOSIS — Z5189 Encounter for other specified aftercare: Secondary | ICD-10-CM | POA: Diagnosis not present

## 2018-12-29 DIAGNOSIS — Z5112 Encounter for antineoplastic immunotherapy: Secondary | ICD-10-CM | POA: Diagnosis not present

## 2018-12-29 LAB — CBC WITH DIFFERENTIAL/PLATELET
Abs Immature Granulocytes: 0.09 10*3/uL — ABNORMAL HIGH (ref 0.00–0.07)
Basophils Absolute: 0 10*3/uL (ref 0.0–0.1)
Basophils Relative: 0 %
Eosinophils Absolute: 0.1 10*3/uL (ref 0.0–0.5)
Eosinophils Relative: 1 %
HCT: 36.3 % — ABNORMAL LOW (ref 39.0–52.0)
Hemoglobin: 11.5 g/dL — ABNORMAL LOW (ref 13.0–17.0)
Immature Granulocytes: 1 %
Lymphocytes Relative: 17 %
Lymphs Abs: 1.6 10*3/uL (ref 0.7–4.0)
MCH: 30.1 pg (ref 26.0–34.0)
MCHC: 31.7 g/dL (ref 30.0–36.0)
MCV: 95 fL (ref 80.0–100.0)
Monocytes Absolute: 0.6 10*3/uL (ref 0.1–1.0)
Monocytes Relative: 6 %
Neutro Abs: 7.1 10*3/uL (ref 1.7–7.7)
Neutrophils Relative %: 75 %
Platelets: 220 10*3/uL (ref 150–400)
RBC: 3.82 MIL/uL — ABNORMAL LOW (ref 4.22–5.81)
RDW: 16.1 % — ABNORMAL HIGH (ref 11.5–15.5)
WBC: 9.4 10*3/uL (ref 4.0–10.5)
nRBC: 0 % (ref 0.0–0.2)

## 2018-12-29 LAB — COMPREHENSIVE METABOLIC PANEL
ALT: 19 U/L (ref 0–44)
AST: 22 U/L (ref 15–41)
Albumin: 2.9 g/dL — ABNORMAL LOW (ref 3.5–5.0)
Alkaline Phosphatase: 109 U/L (ref 38–126)
Anion gap: 10 (ref 5–15)
BUN: 8 mg/dL (ref 8–23)
CO2: 21 mmol/L — ABNORMAL LOW (ref 22–32)
Calcium: 8.1 mg/dL — ABNORMAL LOW (ref 8.9–10.3)
Chloride: 105 mmol/L (ref 98–111)
Creatinine, Ser: 0.79 mg/dL (ref 0.61–1.24)
GFR calc Af Amer: >60 mL/min (ref >60)
GFR calc non Af Amer: >60 mL/min (ref >60)
Glucose, Bld: 325 mg/dL — ABNORMAL HIGH (ref 70–99)
Potassium: 3.9 mmol/L (ref 3.5–5.1)
Sodium: 136 mmol/L (ref 135–145)
Total Bilirubin: 0.5 mg/dL (ref 0.3–1.2)
Total Protein: 5.5 g/dL — ABNORMAL LOW (ref 6.5–8.1)

## 2018-12-29 LAB — URINALYSIS, DIPSTICK ONLY
Bacteria, UA: NONE SEEN
Bilirubin Urine: NEGATIVE
Glucose, UA: >=500 mg/dL — AB
Hgb urine dipstick: NEGATIVE
Ketones, ur: NEGATIVE mg/dL
Leukocytes,Ua: NEGATIVE
Nitrite: NEGATIVE
Protein, ur: NEGATIVE mg/dL
Specific Gravity, Urine: 1.026 (ref 1.005–1.030)
pH: 5 (ref 5.0–8.0)

## 2018-12-29 MED ORDER — SODIUM CHLORIDE 0.9 % IV SOLN
90.0000 mg/m2 | Freq: Once | INTRAVENOUS | Status: AC
  Administered 2018-12-29: 200 mg via INTRAVENOUS
  Filled 2018-12-29: qty 10

## 2018-12-29 MED ORDER — SODIUM CHLORIDE 0.9 % IV SOLN
1920.0000 mg/m2 | INTRAVENOUS | Status: DC
  Administered 2018-12-29: 13:00:00 4100 mg via INTRAVENOUS
  Filled 2018-12-29: qty 82

## 2018-12-29 MED ORDER — SODIUM CHLORIDE 0.9 % IV SOLN
327.0000 mg/m2 | Freq: Once | INTRAVENOUS | Status: AC
  Administered 2018-12-29: 11:00:00 700 mg via INTRAVENOUS
  Filled 2018-12-29: qty 35

## 2018-12-29 MED ORDER — SODIUM CHLORIDE 0.9% FLUSH: 10.0000 mL | INTRAVENOUS | Status: DC | PRN

## 2018-12-29 MED ORDER — SODIUM CHLORIDE 0.9 % IV SOLN
Freq: Once | INTRAVENOUS | Status: AC
  Administered 2018-12-29: 10:00:00 via INTRAVENOUS

## 2018-12-29 MED ORDER — PALONOSETRON HCL INJECTION 0.25 MG/5ML
0.2500 mg | Freq: Once | INTRAVENOUS | Status: AC
  Administered 2018-12-29: 0.25 mg via INTRAVENOUS
  Filled 2018-12-29: qty 5

## 2018-12-29 MED ORDER — SODIUM CHLORIDE 0.9 % IV SOLN
400.0000 mg | Freq: Once | INTRAVENOUS | Status: AC
  Administered 2018-12-29: 11:00:00 400 mg via INTRAVENOUS
  Filled 2018-12-29: qty 16

## 2018-12-29 MED ORDER — SODIUM CHLORIDE 0.9 % IV SOLN
Freq: Once | INTRAVENOUS | Status: AC
  Administered 2018-12-29: 10:00:00 via INTRAVENOUS
  Filled 2018-12-29: qty 5

## 2018-12-29 MED ORDER — ATROPINE SULFATE 1 MG/ML IJ SOLN
0.5000 mg | Freq: Once | INTRAMUSCULAR | Status: AC | PRN
  Administered 2018-12-29: 0.5 mg via INTRAVENOUS
  Filled 2018-12-29: qty 1

## 2018-12-29 MED ORDER — FLUOROURACIL CHEMO INJECTION 2.5 GM/50ML
320.0000 mg/m2 | Freq: Once | INTRAVENOUS | Status: AC
  Administered 2018-12-29: 13:00:00 700 mg via INTRAVENOUS
  Filled 2018-12-29: qty 14

## 2018-12-29 NOTE — Progress Notes (Signed)
Pt presents today for treatment and f/u office visit. VS within parameters for treatment. Labs pending. MAR reviewed. Pt states he is feeling much better now and his appetite has increased.   Message received from [facility] LPN. Labs reviewed by Dr. [name] and pt has been seen. Proceed with treatment.   Treatment given today per MD orders. Tolerated infusion without adverse affects. Vital signs stable. No complaints at this time. Discharged from clinic ambulatory. F/U with [facility] as scheduled.

## 2018-12-29 NOTE — Patient Instructions (Signed)
[facility] Discharge Instructions for Patients Receiving Chemotherapy  Today you received the following chemotherapy agents   To help prevent nausea and vomiting after your treatment, we encourage you to take your nausea medication   If you develop nausea and vomiting that is not controlled by your nausea medication, call the clinic.   BELOW ARE SYMPTOMS THAT SHOULD BE REPORTED IMMEDIATELY:  *FEVER GREATER THAN 100.5 F  *CHILLS WITH OR WITHOUT FEVER  NAUSEA AND VOMITING THAT IS NOT CONTROLLED WITH YOUR NAUSEA MEDICATION  *UNUSUAL SHORTNESS OF BREATH  *UNUSUAL BRUISING OR BLEEDING  TENDERNESS IN MOUTH AND THROAT WITH OR WITHOUT PRESENCE OF ULCERS  *URINARY PROBLEMS  *BOWEL PROBLEMS  UNUSUAL RASH Items with * indicate a potential emergency and should be followed up as soon as possible.  Feel free to call the clinic should you have any questions or concerns. The clinic phone number is [phone].  Please show the CHEMO ALERT CARD at check-in to the Emergency Department and triage nurse.   

## 2018-12-29 NOTE — Patient Instructions (Addendum)
[facility] at [facility] Discharge Instructions  You were seen today by Dr. Katragadda. He went over your recent lab results. He will see you back in 2 weeks for labs and follow up.   Thank you for choosing [facility] at [facility] to provide your oncology and hematology care.  To afford each patient quality time with our provider, please arrive at least 15 minutes before your scheduled appointment time.   If you have a lab appointment with the Cancer Center please come in thru the  Main Entrance and check in at the main information desk  You need to re-schedule your appointment should you arrive 10 or more minutes late.  We strive to give you quality time with our providers, and arriving late affects you and other patients whose appointments are after yours.  Also, if you no show three or more times for appointments you may be dismissed from the clinic at the providers discretion.     Again, thank you for choosing [facility].  Our hope is that these requests will decrease the amount of time that you wait before being seen by our physicians.       _____________________________________________________________  Should you have questions after your visit to [facility], please contact our office at [phone] between the hours of 8:00 a.m. and 4:30 p.m.  Voicemails left after 4:00 p.m. will not be returned until the following business day.  For prescription refill requests, have your pharmacy contact our office and allow 72 hours.    Cancer Center Support Programs:   > Cancer Support Group  2nd Tuesday of the month 1pm-2pm, Journey Room    

## 2018-12-29 NOTE — Progress Notes (Signed)
[place], [place] [address]   CLINIC:  Medical Oncology/Hematology  PCP:  [name], MD [place] [address] [phone]   REASON FOR VISIT:  Follow-up for Stage IV Colon Cancer  CURRENT THERAPY: FOLFIRI  BRIEF ONCOLOGIC HISTORY:  Oncology History  Rectal cancer ([place])  07/24/2010 Initial Diagnosis   Invasive adenocarcinoma of rectum   09/09/2010 Concurrent Chemotherapy   S/P radiation and concurrent 5-FU continuous infusion from 09/09/10- 10/10/10.   11/14/2010 Surgery   S/P proctectomy with colorectal anastomosis and diverting loop ileostomy on 11/14/10 at [facility] by Dr. Harlon Ditty. Pathology reveals a pT3b N1 with 3/20 lymph nodes.   02/05/2011 - 07/14/2011 Chemotherapy   FOLFOX   08/18/2011 Surgery   Approximate date of surgery- [place] by Dr. Harlon Ditty    Remission     11/24/2013 Survivorship   Colonoscopy- somewhat fibrotic/friable anastomotic mucosal-status post biopsy (narrowing not felt to be clinically significant).  Negative pathology for malignancy   09/12/2014 Imaging   DVT in the left femoral venous system, left common iliac vein, IVC, and within the IVC filter Right lower extremity venography confirms chronic occlusion of the femoral venous system with collateralization. The right iliac venous system is patent and do   09/25/2014 PET scan   The right middle lobe pulmonary nodule is hypermetabolic, favored to represent a primary bronchogenic carcinoma.Equivocal mediastinal nodes, similar to surrounding blood pool. Bilateral adrenal hypermetabolism, felt to be physiologic   10/24/2014 Pathology Results   Lung, needle/core biopsy(ies), RML - ADENOCARCINOMA, SEE COMMENT metastatic adenocarcinoma of a colorectal primary   10/24/2014 Relapse/Recurrence     11/16/2014 Definitive Surgery   Bronchoscopy, right video-assisted thoracoscopy, wedge resection of right middle lobe by Dr. Servando Snare   11/16/2014  Pathology Results   Lung, wedge biopsy/resection, right lingula and small portion of middle lobe - METASTATIC ADENOCARCINOMA, CONSISTENT WITH COLORECTAL PRIMARY, SPANNING 2.0 CM. - THE SURGICAL RESECTION MARGINS ARE NEGATIVE FOR ADENOCARCINOMA.   11/16/2014 Remission   THE SURGICAL RESECTION MARGINS ARE NEGATIVE FOR ADENOCARCINOMA.   03/07/2015 Imaging   CT CAP- Interval resection of right middle lobe metastasis. No acute process or evidence of metastatic disease in the chest, abdomen or pelvis. Improved right upper lobe reticular nodular opacities are favored to represent resolving infection.   09/14/2015 Imaging   CT CAP- No findings of recurrent malignancy. No recurrence along the wedge resection site of the right middle lobe. Right anterior abdominal wall focal hernia containing a knuckle of small bowel without complicating feature.   06/13/2016 Imaging   CT CAP- 1. New right-sided pleural metastasis/mass. Other smaller right-sided pulmonary nodules which are all pleural-based and most likely represent pleural metastasis. 2. No convincing evidence of abdominopelvic nodal metastasis. 3. Constellation of findings, including pancreatic atrophy, duct dilatation, and pancreatic head soft tissue fullness which are highly suspicious for pancreatic adenocarcinoma. Metastatic disease felt much less likely. Consider endoscopic ultrasound sampling or ERCP. Cannot exclude superimposed acute pancreatitis. 4. New enlargement of the appendiceal tip with subtle surrounding edema. Cannot exclude early or mild appendicitis. 5. Coronary artery atherosclerosis. Aortic atherosclerosis. 6. 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   06/13/2016 Progression   CT scan demonstrates progression of disease   06/19/2016 Procedure   EUS with FNA by Dr. Ardis Hughs   06/20/2016 Pathology Results   FINE NEEDLE ASPIRATION, ENDOSCOPIC, PANCREAS UNCINATE AREA(SPECIMEN 1 OF 1 COLLECTED 06/19/16):  MALIGNANT CELLS CONSISTENT WITH METASTATIC ADENOCARCINOMA.  06/25/2016 PET scan   1. Two pleural-based nodules in the right hemithorax are hypermetabolic. Metastatic disease is a distinct consideration. The scattered pulmonary parenchymal nodule seen on previous diagnostic CT imaging are below the threshold for reliable resolution on PET imaging. 2. Hypermetabolic lesion pancreatic head, consistent with neoplasm. As noted on prior CT, adenocarcinoma is any consideration. No evidence for hypermetabolic abdominal lymphadenopathy. Mottled uptake noted in the liver, but no discrete hepatic metastases are evident on PET imaging. 3. Appendix remains distended up to 0.9-10 mm diameter with a stone towards the tip in subtle periappendiceal edema/inflammation. The appendix is hypermetabolic along its length. Imaging features are relatively stable in the 12 day interval since prior CT scan. While appendicitis is a consideration, the relative stability over 12 days would be unusual for that etiology. Continued close follow-up recommended.   06/26/2016 Pathology Results   Not enough tissue for foundationONE or K-ras testing.   07/07/2016 Procedure   Port placed by Dr. Arnoldo Morale   07/08/2016 -  Chemotherapy   The patient had pegfilgrastim (NEULASTA) injection 6 mg, 6 mg, Subcutaneous,  Once, 0 of 4 cycles  ondansetron (ZOFRAN) IVPB 8 mg, 8 mg, Intravenous,  Once, 0 of 4 cycles  leucovorin 804 mg in dextrose 5 % 250 mL infusion, 400 mg/m2, Intravenous,  Once, 0 of 4 cycles  oxaliplatin (ELOXATIN) 170 mg in dextrose 5 % 500 mL chemo infusion, 85 mg/m2, Intravenous,  Once, 0 of 4 cycles  fluorouracil (ADRUCIL) 4,800 mg in sodium chloride 0.9 % 150 mL chemo infusion, 2,400 mg/m2 = 4,800 mg, Intravenous, 1 Day/Dose, 0 of 4 cycles  fluorouracil (ADRUCIL) chemo injection 800 mg, 400 mg/m2, Intravenous,  Once, 0 of 4 cycles  pegfilgrastim (NEULASTA) injection 6 mg, 6 mg, Subcutaneous,  Once, 2 of 2  cycles  ondansetron (ZOFRAN) IVPB 8 mg, 8 mg, Intravenous,  Once, 12 of 12 cycles  leucovorin 660 mg in dextrose 5 % 250 mL infusion, 660 mg (original dose ), Intravenous,  Once, 12 of 12 cycles Dose modification: 660 mg (Cycle 1)  oxaliplatin (ELOXATIN) 140 mg in dextrose 5 % 500 mL chemo infusion, 140 mg (original dose ), Intravenous,  Once, 12 of 12 cycles Dose modification: 140 mg (Cycle 1), 119 mg (85 % of original dose 140 mg, Cycle 8, Reason: Provider Judgment)  fluorouracil (ADRUCIL) 3,850 mg in sodium chloride 0.9 % 150 mL chemo infusion, 3,850 mg (original dose ), Intravenous, 1 Day/Dose, 12 of 12 cycles Dose modification: 3,850 mg (Cycle 1)  fluorouracil (ADRUCIL) chemo injection 700 mg, 700 mg (original dose ), Intravenous,  Once, 12 of 12 cycles Dose modification: 700 mg (Cycle 1)  palonosetron (ALOXI) injection 0.25 mg, 0.25 mg, Intravenous,  Once, 7 of 12 cycles Administration: 0.25 mg (09/30/2016)  bevacizumab (AVASTIN) 475 mg in sodium chloride 0.9 % 100 mL chemo infusion, 5 mg/kg = 475 mg, Intravenous,  Once, 6 of 11 cycles Administration: 500 mg (09/30/2016)  leucovorin 880 mg in dextrose 5 % 250 mL infusion, 400 mg/m2 = 880 mg, Intravenous,  Once, 7 of 12 cycles Administration: 880 mg (09/30/2016)  oxaliplatin (ELOXATIN) 185 mg in dextrose 5 % 500 mL chemo infusion, 85 mg/m2 = 185 mg, Intravenous,  Once, 7 of 12 cycles Administration: 185 mg (09/30/2016)  fluorouracil (ADRUCIL) chemo injection 900 mg, 400 mg/m2 = 900 mg, Intravenous,  Once, 7 of 12 cycles Administration: 900 mg (09/30/2016)  fluorouracil (ADRUCIL) 5,300 mg in sodium chloride 0.9 % 144 mL chemo infusion, 2,400 mg/m2 = 5,300  mg, Intravenous, 1 Day/Dose, 7 of 12 cycles Administration: 5,300 mg (09/30/2016)  for chemotherapy treatment.     08/27/2016 Imaging   CT CAP- 1. Pulmonary metastatic disease is stable. 2. Pancreatic head mass, grossly stable, with progressive atrophy of the body and tail of the  pancreas. Stable peripancreatic lymph node. 3. Mild circumferential rectal wall thickening, stable. 4. Aortic atherosclerosis (ICD10-170.0). Coronary artery calcification. 5. Hyperattenuating lesion off the lower pole left kidney, stable, too small to characterize. Continued attention on followup exams is warranted. 6. Persistent wall thickening and mild dilatation of the proximal appendix, of uncertain etiology. Continued attention on followup exams is warranted.   11/06/2016 Imaging   CT C/A/P: IMPRESSION: 1. Stable pulmonary metastatic disease. 2. Similar appearing pancreatic head mass. Progressed atrophy of the pancreatic body and tail. Stable peripancreatic lymph node. 3. Stable hypoattenuating lesion partially exophytic off the inferior pole of the left kidney. 4. Aortic atherosclerosis. 5. Persistent mild wall thickening and mild dilatation of the appendix.    01/30/2017 Imaging   Ct C/A/P: IMPRESSION: 1. Stable pulmonary metastatic disease. 2. Stable to slight increase in size of pancreatic head mass. 3.  Aortic Atherosclerosis (ICD10-I70.0). 4. Similar appearance of mild wall thickening of the appendix which contains an appendicolith.   05/01/2017 Imaging   CT C/A/P: Stable disease in the lungs, no progression of disease in the abdomen.   10/04/2018 -  Chemotherapy   The patient had palonosetron (ALOXI) injection 0.25 mg, 0.25 mg, Intravenous,  Once, 5 of 6 cycles Administration: 0.25 mg (10/04/2018), 0.25 mg (10/25/2018), 0.25 mg (12/01/2018), 0.25 mg (12/15/2018), 0.25 mg (12/29/2018) pegfilgrastim (NEULASTA) injection 6 mg, 6 mg, Subcutaneous, Once, 2 of 2 cycles Administration: 6 mg (10/27/2018) pegfilgrastim-cbqv (UDENYCA) injection 6 mg, 6 mg, Subcutaneous, Once, 3 of 4 cycles Administration: 6 mg (12/03/2018), 6 mg (12/17/2018) bevacizumab (AVASTIN) 450 mg in sodium chloride 0.9 % 100 mL chemo infusion, 5 mg/kg = 450 mg, Intravenous,  Once, 4 of 4 cycles Administration:  400 mg (12/01/2018), 400 mg (12/15/2018), 400 mg (12/29/2018) irinotecan (CAMPTOSAR) 380 mg in sodium chloride 0.9 % 500 mL chemo infusion, 180 mg/m2 = 380 mg, Intravenous,  Once, 5 of 6 cycles Dose modification: 144 mg/m2 (original dose 180 mg/m2, Cycle 2, Reason: Provider Judgment, Comment: 20% dose reduction), 90 mg/m2 (50 % of original dose 180 mg/m2, Cycle 3, Reason: Other (see comments), Comment: [id number]) Administration: 380 mg (10/04/2018), 300 mg (10/25/2018), 200 mg (12/01/2018), 200 mg (12/15/2018), 200 mg (12/29/2018) leucovorin 900 mg in sodium chloride 0.9 % 250 mL infusion, 421 mg/m2 = 856 mg, Intravenous,  Once, 5 of 6 cycles Dose modification: 320 mg/m2 (original dose 400 mg/m2, Cycle 2, Reason: Provider Judgment, Comment: 20% dose reduction) Administration: 900 mg (10/04/2018), 700 mg (10/25/2018), 700 mg (12/01/2018), 700 mg (12/15/2018), 700 mg (12/29/2018) fluorouracil (ADRUCIL) chemo injection 850 mg, 400 mg/m2 = 850 mg, Intravenous,  Once, 5 of 6 cycles Dose modification: 320 mg/m2 (original dose 400 mg/m2, Cycle 2, Reason: Provider Judgment, Comment: 20% dose reduction) Administration: 850 mg (10/04/2018), 700 mg (10/25/2018), 700 mg (12/01/2018), 700 mg (12/15/2018), 700 mg (12/29/2018) fosaprepitant (EMEND) 150 mg, dexamethasone (DECADRON) 12 mg in sodium chloride 0.9 % 145 mL IVPB, , Intravenous,  Once, 3 of 4 cycles Administration:  (12/01/2018),  (12/15/2018),  (12/29/2018) fluorouracil (ADRUCIL) 5,000 mg in sodium chloride 0.9 % 150 mL chemo infusion, 2,325 mg/m2 = 5,150 mg, Intravenous, 1 Day/Dose, 5 of 6 cycles Dose modification: 1,920 mg/m2 (original dose 2,400 mg/m2, Cycle 2, Reason: Provider Judgment,  Comment: 20% dose reduction) Administration: 5,000 mg (10/04/2018), 4,100 mg (10/25/2018), 4,100 mg (12/01/2018), 4,100 mg (12/15/2018), 4,100 mg (12/29/2018) bevacizumab-bvzr (ZIRABEV) 400 mg in sodium chloride 0.9 % 100 mL chemo infusion, 5 mg/kg = 400 mg (original dose ), Intravenous,  Once, 0 of 1  cycle Dose modification: 5 mg/kg (Cycle 6)  for chemotherapy treatment.       CANCER STAGING: Cancer Staging Rectal cancer ([place]) Staging form: Colon and Rectum, AJCC 7th Edition - Clinical: Stage IIIB (T3, N1, M0) - Signed by [name], MD on 02/04/2011 - Pathologic stage from 11/17/2014: Stage IVA (M1a) - Signed by Baird Cancer, PA-C on 09/14/2015    INTERVAL HISTORY:  Mr. Stelly 68 y.o. male seen for follow-up and next cycle of chemotherapy.  After last cycle, he did not experience any nausea vomiting or constipation.  He had diarrhea for 1 day and took Imodium which helped.  Dizziness at times has been better.  Numbness in the feet and hands has been stable.  Appetite is 50%.  Energy levels are 75%.  He is drinking 4 cans of boost per day.  He gained about 3 pounds since last visit.  Denies any bleeding per rectum or melena.  REVIEW OF SYSTEMS:  Review of Systems  Neurological: Positive for dizziness and numbness.  All other systems reviewed and are negative.    PAST MEDICAL/SURGICAL HISTORY:  Past Medical History:  Diagnosis Date  . Chronic anticoagulation   . Colon cancer ([place]) 07/24/2010   rectal ca, inv adenocarcinoma  . Depression 04/01/2011  . Diabetes mellitus without complication ([place])   . DVT (deep venous thrombosis) ([place]) 05/09/2011  . GERD (gastroesophageal reflux disease)   . High output ileostomy ([place]) 05/21/2011  . History of kidney stones   . HTN (hypertension)   . Hx of radiation therapy 09/02/10 to 10/14/10   pelvis  . Lung metastasis ([place])   . Neuropathy   . Peripheral vascular disease ([place])    dvt's,pe  . Pneumonia    hx x3  . Pulmonary embolism ([place])   . Rectal cancer ([place]) 01/15/2011   S/P radiation and concurrent 5-FU continuous infusion from 09/09/10- 10/10/10.  S/P proctectomy with colorectal anastomosis and diverting loop ileostomy on 11/14/10 at [facility] by Dr. Harlon Ditty. Pathology reveals a pT3b N1 with 3/20 lymph nodes.      Past Surgical History:  Procedure Laterality Date  . BILIARY STENT PLACEMENT N/A 09/16/2018   Procedure: STENT PLACEMENT;  Surgeon: Rogene Houston, MD;  [facility];  Service: Endoscopy;  Laterality: N/A;  . BIOPSY  09/24/2018   Procedure: BIOPSY;  Surgeon: Danie Binder, MD;  [facility];  Service: Endoscopy;;  . COLON SURGERY  11/14/2010   proctectomy with colorectal anastomosis and diverting loop ileostomy (temporary planned)  . COLONOSCOPY  07/2010   proximal rectal apple core mass 10-14cm from anal verge (adenocarcinoma), 2-3cm distal rectal carpet polyp s/p piecemeal snare polypectomy (adenoma)  . COLONOSCOPY  04/21/2012   RMR: Friable,fibrotic appearing colorectal anastomosis producing some luminal narrowing-not felt to be critical. path: focal erosion with slight inflammation and hyperemia. SURVEILLANCE DUE DEC 2015  . COLONOSCOPY N/A 11/24/2013   Dr. Rourk:somewhat fibrotic/friable anastomotic mucosal-status post biopsy (narrowing not felt to be clinically significant) Single colonic diverticulum. benign polypoid rectal mucosa  . colostomy reversal  april 2013  . ERCP N/A 09/16/2018   Procedure: ENDOSCOPIC RETROGRADE CHOLANGIOPANCREATOGRAPHY (ERCP);  Surgeon: Rogene Houston, MD;  [facility];  Service: Endoscopy;  Laterality: N/A;  . ESOPHAGOGASTRODUODENOSCOPY  07/2010   RMR: schatki ring s/p dilation, small hh, SB bx benign  . ESOPHAGOGASTRODUODENOSCOPY N/A 09/04/2015   Procedure: ESOPHAGOGASTRODUODENOSCOPY (EGD);  Surgeon: Daneil Dolin, MD;  [facility];  Service: Endoscopy;  Laterality: N/A;  730  . EUS  08/2010   Dr. Owens Loffler. uT3N0 circumferential, nearly obstruction rectosigmoid adenocarcinoma, distal edge 12cm from anal verge  . EUS N/A 06/19/2016   Procedure: UPPER ENDOSCOPIC ULTRASOUND (EUS) RADIAL;  Surgeon: Milus Banister, MD;  Location: [facility];  Service: Endoscopy;  Laterality: N/A;  . FLEXIBLE SIGMOIDOSCOPY N/A  09/24/2018   Procedure: FLEXIBLE SIGMOIDOSCOPY;  Surgeon: Danie Binder, MD;  [facility];  Service: Endoscopy;  Laterality: N/A;  . HERNIA REPAIR     abd hernia repair  . IVC filter    . ivc filter    . port a cath placement    . PORT-A-CATH REMOVAL  09/24/2011   Procedure: REMOVAL PORT-A-CATH;  Surgeon: Donato Heinz, MD;  Location: [facility];  Service: General;  Laterality: N/A;  Minor Room  . PORTACATH PLACEMENT Right 07/07/2016   Procedure: INSERTION PORT-A-CATH;  Surgeon: Aviva Signs, MD;  Location: [facility];  Service: General;  Laterality: Right;  . SPHINCTEROTOMY N/A 09/16/2018   Procedure: SPHINCTEROTOMY with balloon dialation;  Surgeon: Rogene Houston, MD;  [facility];  Service: Endoscopy;  Laterality: N/A;  . TRANSVERSE LOOP COLOSTOMY N/A 09/29/2018   Procedure: TRANSVERSE LOOP COLOSTOMY;  Surgeon: Aviva Signs, MD;  Location: [facility];  Service: General;  Laterality: N/A;  . VIDEO ASSISTED THORACOSCOPY (VATS)/WEDGE RESECTION Right 11/15/2014   Procedure: VIDEO ASSISTED THORACOSCOPY (VATS)/LUNG RESECTION WITH RIGHT LINGULECTOMY;  Surgeon: [name], MD;  Location: [facility];  Service: Thoracic;  Laterality: Right;  Marland Kitchen VIDEO BRONCHOSCOPY N/A 11/15/2014   Procedure: VIDEO BRONCHOSCOPY;  Surgeon: [name], MD;  Location: [facility] OR;  Service: Thoracic;  Laterality: N/A;     SOCIAL HISTORY:  Social History   Socioeconomic History  . Marital status: Married    Spouse name: Not on file  . Number of children: 2  . Years of education: Not on file  . Highest education level: Not on file  Occupational History  . Occupation: self-employed Regulatory affairs officer, currently not working    Fish farm manager: SELF EMPLOYED  Social Needs  . Financial resource strain: Not very hard  . Food insecurity    Worry: Never true    Inability: Never true  . Transportation needs    Medical: No    Non-medical: No  Tobacco Use  . Smoking status: Never Smoker  .  Smokeless tobacco: Never Used  Substance and Sexual Activity  . Alcohol use: No  . Drug use: No  . Sexual activity: Yes    Birth control/protection: None    Comment: married  Lifestyle  . Physical activity    Days per week: 0 days    Minutes per session: 0 min  . Stress: Only a little  Relationships  . Social connections    Talks on phone: More than three times a week    Gets together: Once a week    Attends religious service: Patient refused    Active member of club or organization: Patient refused    Attends meetings of clubs or organizations: Patient refused    Relationship status: Patient refused  . Intimate partner violence    Fear of current or ex partner: Patient refused  Emotionally abused: Patient refused    Physically abused: Patient refused    Forced sexual activity: Patient refused  Other Topics Concern  . Not on file  Social History Narrative  . Not on file    FAMILY HISTORY:  Family History  Problem Relation Age of Onset  . Cancer Brother        throat  . Cancer Brother        prostate  . Colon cancer Neg Hx   . Liver disease Neg Hx   . Inflammatory bowel disease Neg Hx     CURRENT MEDICATIONS:  Outpatient Encounter Medications as of 12/29/2018  Medication Sig  . acetaminophen (TYLENOL) 500 MG tablet Take 1,000 mg by mouth every 6 (six) hours as needed for mild pain or moderate pain.   . clonazePAM (KLONOPIN) 1 MG tablet Take 1 tablet by mouth three times daily as needed  . Diphenhyd-Hydrocort-Nystatin (FIRST-DUKES MOUTHWASH) SUSP Use as directed 5 mLs in the mouth or throat 4 (four) times daily.  . diphenoxylate-atropine (LOMOTIL) 2.5-0.025 MG tablet Take 1 tablet by mouth 4 (four) times daily as needed for diarrhea or loose stools.  Marland Kitchen dronabinol (MARINOL) 5 MG capsule Take 1 capsule (5 mg total) by mouth 2 (two) times daily before a meal.  . gabapentin (NEURONTIN) 300 MG capsule Take 3 capsules (900 mg total) by mouth 2 (two) times daily.  Marland Kitchen  glimepiride (AMARYL) 2 MG tablet Take 2 mg by mouth 2 (two) times daily before a meal.   . hydrocortisone (CORTEF) 20 MG tablet Take 20 mg by mouth daily.   Marland Kitchen levofloxacin (LEVAQUIN) 500 MG tablet Take 1 tablet (500 mg total) by mouth daily.  . megestrol (MEGACE) 400 MG/10ML suspension Take 10 mLs (400 mg total) by mouth 2 (two) times daily.  . mirtazapine (REMERON) 7.5 MG tablet Take 7.5 mg by mouth at bedtime.   Marland Kitchen omeprazole (PRILOSEC) 20 MG capsule Take 20 mg by mouth 2 (two) times daily before a meal.   . ondansetron (ZOFRAN) 8 MG tablet Take 1 tablet (8 mg total) by mouth 2 (two) times daily as needed for refractory nausea / vomiting. Start on day 3 after chemotherapy.  Marland Kitchen oxyCODONE-acetaminophen (PERCOCET) 10-325 MG tablet Take 1 tablet by mouth every 6 (six) hours as needed for pain.  . pantoprazole (PROTONIX) 40 MG tablet   . Probiotic Product (PROBIOTIC DAILY PO) Take 1 capsule by mouth daily.  . prochlorperazine (COMPAZINE) 10 MG tablet Take 1 tablet (10 mg total) by mouth every 6 (six) hours as needed for nausea or vomiting.  . promethazine (PHENERGAN) 25 MG tablet Take 1 tablet (25 mg total) by mouth every 8 (eight) hours as needed for refractory nausea / vomiting.  . rivaroxaban (XARELTO) 20 MG TABS tablet Take 1 tablet (20 mg total) by mouth daily with supper.  . traZODone (DESYREL) 100 MG tablet Take 1 tablet (100 mg total) by mouth at bedtime.  . vitamin B-12 (CYANOCOBALAMIN) 1000 MCG tablet Take 1,000 mcg by mouth daily.   Facility-Administered Encounter Medications as of 12/29/2018  Medication  . sodium chloride 0.9 % injection 10 mL  . sodium chloride flush (NS) 0.9 % injection 10 mL    ALLERGIES:  Allergies  Allergen Reactions  . Oxycodone     Blisters, hallucinations  . Tramadol     Blisters, hallucinations  . Trazodone And Nefazodone Other (See Comments)    hallucinations     PHYSICAL EXAM:  ECOG Performance status: 2  Vitals:  12/29/18 0820  BP: 123/70   Pulse: 93  Resp: 14  Temp: (!) 97.3 F (36.3 C)  SpO2: 100%   Filed Weights   12/29/18 0820  Weight: 171 lb 6.4 oz (77.7 kg)    Physical Exam Constitutional:      Appearance: Normal appearance.  HENT:     Head: Normocephalic.     Nose: Nose normal.     Mouth/Throat:     Mouth: Mucous membranes are moist.     Pharynx: Oropharynx is clear.  Eyes:     Extraocular Movements: Extraocular movements intact.     Conjunctiva/sclera: Conjunctivae normal.  Neck:     Musculoskeletal: Normal range of motion.  Cardiovascular:     Rate and Rhythm: Regular rhythm. Tachycardia present.     Pulses: Normal pulses.     Heart sounds: Normal heart sounds.  Pulmonary:     Effort: Pulmonary effort is normal.     Breath sounds: Normal breath sounds.  Abdominal:     General: Abdomen is flat. Bowel sounds are normal.     Palpations: Abdomen is soft.  Musculoskeletal: Normal range of motion.  Skin:    General: Skin is warm and dry.  Neurological:     General: No focal deficit present.     Mental Status: He is alert and oriented to person, place, and time.  Psychiatric:        Mood and Affect: Mood normal.        Behavior: Behavior normal.        Thought Content: Thought content normal.        Judgment: Judgment normal.      LABORATORY DATA:  I have reviewed the labs as listed.  CBC    Component Value Date/Time   WBC 9.4 12/29/2018 0804   RBC 3.82 (L) 12/29/2018 0804   HGB 11.5 (L) 12/29/2018 0804   HGB 11.6 (L) 02/04/2011 1059   HCT 36.3 (L) 12/29/2018 0804   HCT 34.7 (L) 02/04/2011 1059   PLT 220 12/29/2018 0804   PLT 282 02/04/2011 1059   MCV 95.0 12/29/2018 0804   MCV 76.0 (L) 02/04/2011 1059   MCH 30.1 12/29/2018 0804   MCHC 31.7 12/29/2018 0804   RDW 16.1 (H) 12/29/2018 0804   RDW 18.5 (H) 02/04/2011 1059   LYMPHSABS 1.6 12/29/2018 0804   LYMPHSABS 1.1 02/04/2011 1059   MONOABS 0.6 12/29/2018 0804   MONOABS 0.5 02/04/2011 1059   EOSABS 0.1 12/29/2018 0804   EOSABS  0.1 02/04/2011 1059   BASOSABS 0.0 12/29/2018 0804   BASOSABS 0.1 02/04/2011 1059   CMP Latest Ref Rng & Units 12/29/2018 12/15/2018 12/08/2018  Glucose 70 - 99 mg/dL 325(H) 225(H) 282(H)  BUN 8 - 23 mg/dL '8 15 14  '$ Creatinine 0.61 - 1.24 mg/dL 0.79 0.66 0.76  Sodium 135 - 145 mmol/L 136 138 135  Potassium 3.5 - 5.1 mmol/L 3.9 3.9 4.4  Chloride 98 - 111 mmol/L 105 106 99  CO2 22 - 32 mmol/L 21(L) 23 25  Calcium 8.9 - 10.3 mg/dL 8.1(L) 8.9 8.9  Total Protein 6.5 - 8.1 g/dL 5.5(L) 6.1(L) 6.3(L)  Total Bilirubin 0.3 - 1.2 mg/dL 0.5 0.6 1.3(H)  Alkaline Phos 38 - 126 U/L 109 89 110  AST 15 - 41 U/L '22 19 16  '$ ALT 0 - 44 U/L '19 23 20    '$ I have personally reviewed the scans and discussed with the patient.   ASSESSMENT & PLAN:   Rectal cancer 1.  Stage  IV rectal cancer with lung and pancreatic metastasis: Foundation 1 CDX shows KRAS G13D and NRAS wild type, MS-stable. -12 cycles of FOLFOX in the adjuvant setting from October 12 through 07/14/2011. -15 cycles of FOLFOX and bevacizumab from 07/08/2016 through 01/20/2017 followed by infusional 5-FU and bevacizumab until 07/14/2017. -Maintenance Xeloda from 08/07/2017 with bevacizumab through 08/19/2018 with progression. - ERCP and stent placement on 09/16/2018 showing single localized biliary stricture.  Biliary sphincterotomy was performed with stricture dilated to 6 mm with balloon to facilitate stent placement.  1 covered metal stent was placed. -Flexible sigmoidoscopy on 09/24/2018 showing narrowing of the colorectal anastomosis.  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 - 2 cycles of FOLFIRI from 10/04/2018 through 10/25/2018 with 20% dose reduction during cycle 2. - He has developed severe tiredness after cycle 2 even after dose reduction. 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  - Cycle 3 and cycle 4 with 50% dose reduction of Irinotecan on 12/01/2018 and  12/15/2018. - He tolerated dose reduction very well.  He is continuing to gain weight. -We reviewed his labs.  He may proceed with the next cycle today.  I plan to scan him after at least 3 more cycles.  He will be seen back in 2 weeks for follow-up.  2.  Weight loss: -He is taking Megace twice daily.  He is also drinking Ensure/boost 4 cans/day. -He gained 3 pounds from last visit 2 weeks ago.  3.  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  4.  Anxiety: -He takes Klonopin 1 mg twice daily.   Total time spent is 25 minutes with more than 50% of the time spent face-to-face discussing treatment plan, counseling and coordination of care.  Orders placed this encounter:  No orders of the defined types were placed in this encounter.     Derek Jack, MD  [place] [phone]

## 2018-12-29 NOTE — Assessment & Plan Note (Signed)
1.  Stage IV rectal cancer with lung and pancreatic metastasis: Foundation 1 CDX shows KRAS G13D and NRAS wild type, MS-stable. -12 cycles of FOLFOX in the adjuvant setting from October 12 through 07/14/2011. -15 cycles of FOLFOX and bevacizumab from 07/08/2016 through 01/20/2017 followed by infusional 5-FU and bevacizumab until 07/14/2017. -Maintenance Xeloda from 08/07/2017 with bevacizumab through 08/19/2018 with progression. - ERCP and stent placement on 09/16/2018 showing single localized biliary stricture.  Biliary sphincterotomy was performed with stricture dilated to 6 mm with balloon to facilitate stent placement.  1 covered metal stent was placed. -Flexible sigmoidoscopy on 09/24/2018 showing narrowing of the colorectal anastomosis.  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 - 2 cycles of FOLFIRI from 10/04/2018 through 10/25/2018 with 20% dose reduction during cycle 2. - He has developed severe tiredness after cycle 2 even after dose reduction. 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  - Cycle 3 and cycle 4 with 50% dose reduction of Irinotecan on 12/01/2018 and 12/15/2018. - He tolerated dose reduction very well.  He is continuing to gain weight. -We reviewed his labs.  He may proceed with the next cycle today.  I plan to scan him after at least 3 more cycles.  He will be seen back in 2 weeks for follow-up.  2.  Weight loss: -He is taking Megace twice daily.  He is also drinking Ensure/boost 4 cans/day. -He gained 3 pounds from last visit 2 weeks ago.  3.  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  4.  Anxiety: -He takes Klonopin 1 mg twice daily.

## 2018-12-31 ENCOUNTER — Inpatient Hospital Stay (HOSPITAL_COMMUNITY): Payer: Medicare Other

## 2018-12-31 ENCOUNTER — Other Ambulatory Visit: Payer: Self-pay

## 2018-12-31 VITALS — BP 120/72 | HR 66 | Temp 97.7°F | Resp 18

## 2018-12-31 DIAGNOSIS — C7800 Secondary malignant neoplasm of unspecified lung: Secondary | ICD-10-CM | POA: Diagnosis not present

## 2018-12-31 DIAGNOSIS — Z7901 Long term (current) use of anticoagulants: Secondary | ICD-10-CM | POA: Diagnosis not present

## 2018-12-31 DIAGNOSIS — Z5112 Encounter for antineoplastic immunotherapy: Secondary | ICD-10-CM | POA: Diagnosis not present

## 2018-12-31 DIAGNOSIS — Z86711 Personal history of pulmonary embolism: Secondary | ICD-10-CM | POA: Diagnosis not present

## 2018-12-31 DIAGNOSIS — T451X5S Adverse effect of antineoplastic and immunosuppressive drugs, sequela: Secondary | ICD-10-CM | POA: Diagnosis not present

## 2018-12-31 DIAGNOSIS — Z85118 Personal history of other malignant neoplasm of bronchus and lung: Secondary | ICD-10-CM | POA: Diagnosis not present

## 2018-12-31 DIAGNOSIS — Z86718 Personal history of other venous thrombosis and embolism: Secondary | ICD-10-CM | POA: Diagnosis not present

## 2018-12-31 DIAGNOSIS — Z5111 Encounter for antineoplastic chemotherapy: Secondary | ICD-10-CM | POA: Diagnosis not present

## 2018-12-31 DIAGNOSIS — Z85048 Personal history of other malignant neoplasm of rectum, rectosigmoid junction, and anus: Secondary | ICD-10-CM | POA: Diagnosis not present

## 2018-12-31 DIAGNOSIS — C7889 Secondary malignant neoplasm of other digestive organs: Secondary | ICD-10-CM | POA: Diagnosis not present

## 2018-12-31 DIAGNOSIS — Z432 Encounter for attention to ileostomy: Secondary | ICD-10-CM | POA: Diagnosis not present

## 2018-12-31 DIAGNOSIS — K9189 Other postprocedural complications and disorders of digestive system: Secondary | ICD-10-CM | POA: Diagnosis not present

## 2018-12-31 DIAGNOSIS — Z7984 Long term (current) use of oral hypoglycemic drugs: Secondary | ICD-10-CM | POA: Diagnosis not present

## 2018-12-31 DIAGNOSIS — C20 Malignant neoplasm of rectum: Secondary | ICD-10-CM | POA: Diagnosis not present

## 2018-12-31 DIAGNOSIS — E1151 Type 2 diabetes mellitus with diabetic peripheral angiopathy without gangrene: Secondary | ICD-10-CM | POA: Diagnosis not present

## 2018-12-31 DIAGNOSIS — Z5181 Encounter for therapeutic drug level monitoring: Secondary | ICD-10-CM | POA: Diagnosis not present

## 2018-12-31 DIAGNOSIS — G620 Drug-induced polyneuropathy: Secondary | ICD-10-CM | POA: Diagnosis not present

## 2018-12-31 DIAGNOSIS — Z902 Acquired absence of lung [part of]: Secondary | ICD-10-CM | POA: Diagnosis not present

## 2018-12-31 DIAGNOSIS — C7801 Secondary malignant neoplasm of right lung: Secondary | ICD-10-CM | POA: Diagnosis not present

## 2018-12-31 DIAGNOSIS — Z95828 Presence of other vascular implants and grafts: Secondary | ICD-10-CM | POA: Diagnosis not present

## 2018-12-31 DIAGNOSIS — Z5189 Encounter for other specified aftercare: Secondary | ICD-10-CM | POA: Diagnosis not present

## 2018-12-31 MED ORDER — SODIUM CHLORIDE 0.9% FLUSH
10.0000 mL | INTRAVENOUS | Status: DC | PRN
  Administered 2018-12-31: 10 mL
  Filled 2018-12-31: qty 10

## 2018-12-31 MED ORDER — PEGFILGRASTIM-CBQV 6 MG/0.6ML ~~LOC~~ SOSY
6.0000 mg | PREFILLED_SYRINGE | Freq: Once | SUBCUTANEOUS | Status: AC
  Administered 2018-12-31: 6 mg via SUBCUTANEOUS
  Filled 2018-12-31: qty 0.6

## 2018-12-31 MED ORDER — HEPARIN SOD (PORK) LOCK FLUSH 100 UNIT/ML IV SOLN
500.0000 [IU] | Freq: Once | INTRAVENOUS | Status: AC | PRN
  Administered 2018-12-31: 500 [IU]

## 2018-12-31 NOTE — Progress Notes (Signed)
Pt presents today for pump d/c and Udenyca injection. VSS. Pt has no complaints of any changes since the last visit.    Vital signs stable. No complaints at this time. Discharged from clinic ambulatory. F/U with [facility] as scheduled.

## 2018-12-31 NOTE — Patient Instructions (Signed)
[facility] Discharge Instructions for Patients Receiving Chemotherapy  Today you received the following chemotherapy agents   To help prevent nausea and vomiting after your treatment, we encourage you to take your nausea medication   If you develop nausea and vomiting that is not controlled by your nausea medication, call the clinic.   BELOW ARE SYMPTOMS THAT SHOULD BE REPORTED IMMEDIATELY:  *FEVER GREATER THAN 100.5 F  *CHILLS WITH OR WITHOUT FEVER  NAUSEA AND VOMITING THAT IS NOT CONTROLLED WITH YOUR NAUSEA MEDICATION  *UNUSUAL SHORTNESS OF BREATH  *UNUSUAL BRUISING OR BLEEDING  TENDERNESS IN MOUTH AND THROAT WITH OR WITHOUT PRESENCE OF ULCERS  *URINARY PROBLEMS  *BOWEL PROBLEMS  UNUSUAL RASH Items with * indicate a potential emergency and should be followed up as soon as possible.  Feel free to call the clinic should you have any questions or concerns. The clinic phone number is [phone].  Please show the CHEMO ALERT CARD at check-in to the Emergency Department and triage nurse.   

## 2018-12-31 NOTE — Progress Notes (Signed)
Nutrition Follow-up:  Patient with stage IV rectal cancer with lung and pancreatic metastatic disease. Ileostomy done on 09/29/2018.  Patient receiving chemotherapy at this time.  Spoke with patient during pump removal.  Patient reports that appetite is better and eating more.  Reports ate eggs this am for breakfast, planning sub sandwich for lunch.  Supper not sure what his wife is fixing.  Reports that he has been drinking 4 ensure plus or glucerna (ran out) daily between meals.    Reports that he is feeling better.   Blood glucose at home per patient is 150 in the am, typically but higher on chemo days and day after.    Denies nausea, or diarrhea.    Medications: reviewed  Labs: glucose 325 (8/26)  Anthropometrics:   Weight 171 lb 6.4 oz on 8/26 increased from 169 lb on 7/23.     NUTRITION DIAGNOSIS:  Increased nutrient needs continues   INTERVENTION:  Patient prefers chocolate glucerna shakes.  Provided more samples for patient today.   Encouraged patient to continue good sources of protein at every meal/snack and keep calories high.       MONITORING, EVALUATION, GOAL: Patient will consume adequate calories and protein to maintain weight   NEXT VISIT: Sept 11 at pump removal  Charles [name] B. Zenia Resides, [place], [place] Registered Dietitian [phone] (pager)

## 2019-01-03 DIAGNOSIS — C7889 Secondary malignant neoplasm of other digestive organs: Secondary | ICD-10-CM | POA: Diagnosis not present

## 2019-01-03 DIAGNOSIS — Z432 Encounter for attention to ileostomy: Secondary | ICD-10-CM | POA: Diagnosis not present

## 2019-01-03 DIAGNOSIS — K9189 Other postprocedural complications and disorders of digestive system: Secondary | ICD-10-CM | POA: Diagnosis not present

## 2019-01-03 DIAGNOSIS — C20 Malignant neoplasm of rectum: Secondary | ICD-10-CM | POA: Diagnosis not present

## 2019-01-03 DIAGNOSIS — E1151 Type 2 diabetes mellitus with diabetic peripheral angiopathy without gangrene: Secondary | ICD-10-CM | POA: Diagnosis not present

## 2019-01-03 DIAGNOSIS — C7800 Secondary malignant neoplasm of unspecified lung: Secondary | ICD-10-CM | POA: Diagnosis not present

## 2019-01-04 DIAGNOSIS — C20 Malignant neoplasm of rectum: Secondary | ICD-10-CM | POA: Diagnosis not present

## 2019-01-04 DIAGNOSIS — Z432 Encounter for attention to ileostomy: Secondary | ICD-10-CM | POA: Diagnosis not present

## 2019-01-04 DIAGNOSIS — C7800 Secondary malignant neoplasm of unspecified lung: Secondary | ICD-10-CM | POA: Diagnosis not present

## 2019-01-04 DIAGNOSIS — K9189 Other postprocedural complications and disorders of digestive system: Secondary | ICD-10-CM | POA: Diagnosis not present

## 2019-01-04 DIAGNOSIS — E1151 Type 2 diabetes mellitus with diabetic peripheral angiopathy without gangrene: Secondary | ICD-10-CM | POA: Diagnosis not present

## 2019-01-04 DIAGNOSIS — C7889 Secondary malignant neoplasm of other digestive organs: Secondary | ICD-10-CM | POA: Diagnosis not present

## 2019-01-05 DIAGNOSIS — K9189 Other postprocedural complications and disorders of digestive system: Secondary | ICD-10-CM | POA: Diagnosis not present

## 2019-01-07 NOTE — Progress Notes (Signed)
.  The following biosimilar bevacizumab-bvzr Noah Charon) has been selected for use in this patient.  Henreitta Leber, PharmD

## 2019-01-11 ENCOUNTER — Other Ambulatory Visit (HOSPITAL_COMMUNITY): Payer: Self-pay | Admitting: Emergency Medicine

## 2019-01-11 ENCOUNTER — Ambulatory Visit: Payer: Medicare Other | Admitting: Nurse Practitioner

## 2019-01-11 DIAGNOSIS — E1151 Type 2 diabetes mellitus with diabetic peripheral angiopathy without gangrene: Secondary | ICD-10-CM | POA: Diagnosis not present

## 2019-01-11 DIAGNOSIS — C7800 Secondary malignant neoplasm of unspecified lung: Secondary | ICD-10-CM | POA: Diagnosis not present

## 2019-01-11 DIAGNOSIS — C7889 Secondary malignant neoplasm of other digestive organs: Secondary | ICD-10-CM | POA: Diagnosis not present

## 2019-01-11 DIAGNOSIS — K9189 Other postprocedural complications and disorders of digestive system: Secondary | ICD-10-CM | POA: Diagnosis not present

## 2019-01-11 DIAGNOSIS — C20 Malignant neoplasm of rectum: Secondary | ICD-10-CM | POA: Diagnosis not present

## 2019-01-11 DIAGNOSIS — Z432 Encounter for attention to ileostomy: Secondary | ICD-10-CM | POA: Diagnosis not present

## 2019-01-11 NOTE — Progress Notes (Unsigned)
Patients home care nurse Arbie Cookey called requesting patient have lomotil called in for his diarrhea. Also requests Anti fungal, calcium alginate and hydrocolloid dressings for stoma wound caused by excessive diarrhea. Reynolds Bowl, NP has ordered for pt to receive IVF 1Litter/ 2hours. She has ordered diflucan and lomotil for the patient and approved the calcium alginate and hydrocolloid. I called Arbie Cookey, RN to discuss this but was sent to her voicemail. VM left for her to call the clinic back.

## 2019-01-12 ENCOUNTER — Inpatient Hospital Stay (HOSPITAL_COMMUNITY): Payer: Medicare Other | Attending: Hematology

## 2019-01-12 ENCOUNTER — Inpatient Hospital Stay (HOSPITAL_COMMUNITY): Payer: Medicare Other

## 2019-01-12 ENCOUNTER — Encounter (HOSPITAL_COMMUNITY): Payer: Self-pay | Admitting: Hematology

## 2019-01-12 ENCOUNTER — Inpatient Hospital Stay (HOSPITAL_BASED_OUTPATIENT_CLINIC_OR_DEPARTMENT_OTHER): Payer: Medicare Other | Admitting: Hematology

## 2019-01-12 ENCOUNTER — Other Ambulatory Visit: Payer: Self-pay

## 2019-01-12 VITALS — BP 111/69 | HR 89 | Temp 97.1°F | Resp 16 | Wt 171.2 lb

## 2019-01-12 VITALS — BP 106/61 | HR 81 | Temp 97.3°F | Resp 18

## 2019-01-12 DIAGNOSIS — Z5189 Encounter for other specified aftercare: Secondary | ICD-10-CM | POA: Insufficient documentation

## 2019-01-12 DIAGNOSIS — R197 Diarrhea, unspecified: Secondary | ICD-10-CM | POA: Insufficient documentation

## 2019-01-12 DIAGNOSIS — R634 Abnormal weight loss: Secondary | ICD-10-CM | POA: Diagnosis not present

## 2019-01-12 DIAGNOSIS — C7800 Secondary malignant neoplasm of unspecified lung: Secondary | ICD-10-CM | POA: Diagnosis not present

## 2019-01-12 DIAGNOSIS — E119 Type 2 diabetes mellitus without complications: Secondary | ICD-10-CM | POA: Diagnosis not present

## 2019-01-12 DIAGNOSIS — F419 Anxiety disorder, unspecified: Secondary | ICD-10-CM | POA: Insufficient documentation

## 2019-01-12 DIAGNOSIS — C20 Malignant neoplasm of rectum: Secondary | ICD-10-CM

## 2019-01-12 DIAGNOSIS — R195 Other fecal abnormalities: Secondary | ICD-10-CM

## 2019-01-12 DIAGNOSIS — Z7901 Long term (current) use of anticoagulants: Secondary | ICD-10-CM | POA: Insufficient documentation

## 2019-01-12 DIAGNOSIS — Z5112 Encounter for antineoplastic immunotherapy: Secondary | ICD-10-CM | POA: Diagnosis not present

## 2019-01-12 DIAGNOSIS — Z5111 Encounter for antineoplastic chemotherapy: Secondary | ICD-10-CM | POA: Diagnosis not present

## 2019-01-12 DIAGNOSIS — C7989 Secondary malignant neoplasm of other specified sites: Secondary | ICD-10-CM | POA: Diagnosis not present

## 2019-01-12 LAB — COMPREHENSIVE METABOLIC PANEL
ALT: 17 U/L (ref 0–44)
AST: 17 U/L (ref 15–41)
Albumin: 3.1 g/dL — ABNORMAL LOW (ref 3.5–5.0)
Alkaline Phosphatase: 115 U/L (ref 38–126)
Anion gap: 7 (ref 5–15)
BUN: 10 mg/dL (ref 8–23)
CO2: 24 mmol/L (ref 22–32)
Calcium: 8.2 mg/dL — ABNORMAL LOW (ref 8.9–10.3)
Chloride: 105 mmol/L (ref 98–111)
Creatinine, Ser: 0.65 mg/dL (ref 0.61–1.24)
GFR calc Af Amer: >60 mL/min (ref >60)
GFR calc non Af Amer: >60 mL/min (ref >60)
Glucose, Bld: 185 mg/dL — ABNORMAL HIGH (ref 70–99)
Potassium: 4.2 mmol/L (ref 3.5–5.1)
Sodium: 136 mmol/L (ref 135–145)
Total Bilirubin: 0.4 mg/dL (ref 0.3–1.2)
Total Protein: 5.5 g/dL — ABNORMAL LOW (ref 6.5–8.1)

## 2019-01-12 LAB — CBC WITH DIFFERENTIAL/PLATELET
Abs Immature Granulocytes: 0.07 10*3/uL (ref 0.00–0.07)
Basophils Absolute: 0.1 10*3/uL (ref 0.0–0.1)
Basophils Relative: 1 %
Eosinophils Absolute: 0.4 10*3/uL (ref 0.0–0.5)
Eosinophils Relative: 3 %
HCT: 38.5 % — ABNORMAL LOW (ref 39.0–52.0)
Hemoglobin: 12.1 g/dL — ABNORMAL LOW (ref 13.0–17.0)
Immature Granulocytes: 1 %
Lymphocytes Relative: 16 %
Lymphs Abs: 1.7 10*3/uL (ref 0.7–4.0)
MCH: 30.1 pg (ref 26.0–34.0)
MCHC: 31.4 g/dL (ref 30.0–36.0)
MCV: 95.8 fL (ref 80.0–100.0)
Monocytes Absolute: 0.7 10*3/uL (ref 0.1–1.0)
Monocytes Relative: 7 %
Neutro Abs: 7.5 10*3/uL (ref 1.7–7.7)
Neutrophils Relative %: 72 %
Platelets: 230 10*3/uL (ref 150–400)
RBC: 4.02 MIL/uL — ABNORMAL LOW (ref 4.22–5.81)
RDW: 15.1 % (ref 11.5–15.5)
WBC: 10.3 10*3/uL (ref 4.0–10.5)
nRBC: 0 % (ref 0.0–0.2)

## 2019-01-12 MED ORDER — ATROPINE SULFATE 1 MG/ML IJ SOLN
0.5000 mg | Freq: Once | INTRAMUSCULAR | Status: AC | PRN
  Administered 2019-01-12: 11:00:00 0.5 mg via INTRAVENOUS
  Filled 2019-01-12: qty 1

## 2019-01-12 MED ORDER — FLUOROURACIL CHEMO INJECTION 2.5 GM/50ML
320.0000 mg/m2 | Freq: Once | INTRAVENOUS | Status: AC
  Administered 2019-01-12: 700 mg via INTRAVENOUS
  Filled 2019-01-12: qty 14

## 2019-01-12 MED ORDER — SODIUM CHLORIDE 0.9 % IV SOLN
Freq: Once | INTRAVENOUS | Status: AC
  Administered 2019-01-12: 10:00:00 via INTRAVENOUS

## 2019-01-12 MED ORDER — SODIUM CHLORIDE 0.9% FLUSH
10.0000 mL | INTRAVENOUS | Status: DC | PRN
  Administered 2019-01-12: 10 mL
  Filled 2019-01-12: qty 10

## 2019-01-12 MED ORDER — SODIUM CHLORIDE 0.9 % IV SOLN
Freq: Once | INTRAVENOUS | Status: AC
  Administered 2019-01-12: 11:00:00 via INTRAVENOUS
  Filled 2019-01-12: qty 5

## 2019-01-12 MED ORDER — DIPHENOXYLATE-ATROPINE 2.5-0.025 MG PO TABS: 1.0000 | ORAL_TABLET | Freq: Four times a day (QID) | ORAL | 3 refills | Status: DC | PRN

## 2019-01-12 MED ORDER — HEPARIN SOD (PORK) LOCK FLUSH 100 UNIT/ML IV SOLN: 500.0000 [IU] | Freq: Once | INTRAVENOUS | Status: DC | PRN

## 2019-01-12 MED ORDER — SODIUM CHLORIDE 0.9 % IV SOLN
1920.0000 mg/m2 | INTRAVENOUS | Status: DC
  Administered 2019-01-12: 4100 mg via INTRAVENOUS
  Filled 2019-01-12: qty 82

## 2019-01-12 MED ORDER — SODIUM CHLORIDE 0.9 % IV SOLN
90.0000 mg/m2 | Freq: Once | INTRAVENOUS | Status: AC
  Administered 2019-01-12: 12:00:00 200 mg via INTRAVENOUS
  Filled 2019-01-12: qty 10

## 2019-01-12 MED ORDER — PALONOSETRON HCL INJECTION 0.25 MG/5ML
0.2500 mg | Freq: Once | INTRAVENOUS | Status: AC
  Administered 2019-01-12: 11:00:00 0.25 mg via INTRAVENOUS
  Filled 2019-01-12: qty 5

## 2019-01-12 MED ORDER — SODIUM CHLORIDE 0.9 % IV SOLN
327.0000 mg/m2 | Freq: Once | INTRAVENOUS | Status: AC
  Administered 2019-01-12: 700 mg via INTRAVENOUS
  Filled 2019-01-12: qty 35

## 2019-01-12 MED ORDER — SODIUM CHLORIDE 0.9 % IV SOLN
5.0000 mg/kg | Freq: Once | INTRAVENOUS | Status: AC
  Administered 2019-01-12: 400 mg via INTRAVENOUS
  Filled 2019-01-12: qty 16

## 2019-01-12 NOTE — Patient Instructions (Signed)
[facility] Discharge Instructions for Patients Receiving Chemotherapy  Today you received the following chemotherapy agents   To help prevent nausea and vomiting after your treatment, we encourage you to take your nausea medication   If you develop nausea and vomiting that is not controlled by your nausea medication, call the clinic.   BELOW ARE SYMPTOMS THAT SHOULD BE REPORTED IMMEDIATELY:  *FEVER GREATER THAN 100.5 F  *CHILLS WITH OR WITHOUT FEVER  NAUSEA AND VOMITING THAT IS NOT CONTROLLED WITH YOUR NAUSEA MEDICATION  *UNUSUAL SHORTNESS OF BREATH  *UNUSUAL BRUISING OR BLEEDING  TENDERNESS IN MOUTH AND THROAT WITH OR WITHOUT PRESENCE OF ULCERS  *URINARY PROBLEMS  *BOWEL PROBLEMS  UNUSUAL RASH Items with * indicate a potential emergency and should be followed up as soon as possible.  Feel free to call the clinic should you have any questions or concerns. The clinic phone number is [phone].  Please show the CHEMO ALERT CARD at check-in to the Emergency Department and triage nurse.   

## 2019-01-12 NOTE — Assessment & Plan Note (Signed)
1.  Stage IV rectal cancer with lung and pancreatic metastasis: Foundation 1 CDX shows KRAS G13D and NRAS wild type, MS-stable. -12 cycles of FOLFOX in the adjuvant setting from October 12 through 07/14/2011. -15 cycles of FOLFOX and bevacizumab from 07/08/2016 through 01/20/2017 followed by infusional 5-FU and bevacizumab until 07/14/2017. -Maintenance Xeloda from 08/07/2017 with bevacizumab through 08/19/2018 with progression. - ERCP and stent placement on 09/16/2018 showing single localized biliary stricture.  Biliary sphincterotomy was performed with stricture dilated to 6 mm with balloon to facilitate stent placement.  1 covered metal stent was placed. -Flexible sigmoidoscopy on 09/24/2018 showing narrowing of the colorectal anastomosis.  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 - 2 cycles of FOLFIRI from 10/04/2018 through 10/25/2018 with 20% dose reduction during cycle 2. - He has developed severe tiredness after cycle 2 even after dose reduction. 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  - Cycle 3 and cycle 4 with 50% dose reduction of Irinotecan on 12/01/2018 and 12/15/2018. - He has tolerated last cycle reasonably well.  However he had more diarrhea.  He did not have any diarrhea pills and could not take them. - He did not lose any weight since last visit.  Hence we will proceed with his next cycle.  We have sent a prescription for Lomotil. -We will reevaluate him in 2 weeks.  2.  Weight loss: -He is taking Megace twice daily.  He is drinking Ensure 4 cans/day. -He is continuing to gain weight.  3.  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  4.  Anxiety: -He takes Klonopin 1 mg twice daily.

## 2019-01-12 NOTE — Progress Notes (Signed)
Keep dose of irinotecan at 200mg  (90mg /m2) today per Dr. Raliegh Ip, slightly outside of 10% range in setting of weight loss.   Demetrius Charity, PharmD, [facility] Pharmacist Pharmacy Phone: [phone] 01/12/2019

## 2019-01-12 NOTE — Progress Notes (Signed)
Per VO Dr. Raliegh Ip , proceed with treatment. VSS.   Treatment given today per MD orders. Tolerated infusion without adverse affects. Vital signs stable. No complaints at this time.  5FU pump infusing and RUN noted on screen. Discharged from clinic ambulatory. F/U with [facility] as scheduled.

## 2019-01-12 NOTE — Progress Notes (Signed)
[place] [place], [place] [address]   CLINIC:  Medical Oncology/Hematology  PCP:  [name], MD [place] [address] [phone]   REASON FOR VISIT:  Follow-up for Stage IV Colon Cancer  CURRENT THERAPY: FOLFIRI  BRIEF ONCOLOGIC HISTORY:  Oncology History  Rectal cancer ([place])  07/24/2010 Initial Diagnosis   Invasive adenocarcinoma of rectum   09/09/2010 Concurrent Chemotherapy   S/P radiation and concurrent 5-FU continuous infusion from 09/09/10- 10/10/10.   11/14/2010 Surgery   S/P proctectomy with colorectal anastomosis and diverting loop ileostomy on 11/14/10 at [facility] by Dr. Harlon Ditty. Pathology reveals a pT3b N1 with 3/20 lymph nodes.   02/05/2011 - 07/14/2011 Chemotherapy   FOLFOX   08/18/2011 Surgery   Approximate date of surgery- [place] by Dr. Harlon Ditty    Remission     11/24/2013 Survivorship   Colonoscopy- somewhat fibrotic/friable anastomotic mucosal-status post biopsy (narrowing not felt to be clinically significant).  Negative pathology for malignancy   09/12/2014 Imaging   DVT in the left femoral venous system, left common iliac vein, IVC, and within the IVC filter Right lower extremity venography confirms chronic occlusion of the femoral venous system with collateralization. The right iliac venous system is patent and do   09/25/2014 PET scan   The right middle lobe pulmonary nodule is hypermetabolic, favored to represent a primary bronchogenic carcinoma.Equivocal mediastinal nodes, similar to surrounding blood pool. Bilateral adrenal hypermetabolism, felt to be physiologic   10/24/2014 Pathology Results   Lung, needle/core biopsy(ies), RML - ADENOCARCINOMA, SEE COMMENT metastatic adenocarcinoma of a colorectal primary   10/24/2014 Relapse/Recurrence     11/16/2014 Definitive Surgery   Bronchoscopy, right video-assisted thoracoscopy, wedge resection of right middle lobe by Dr. Servando Snare   11/16/2014  Pathology Results   Lung, wedge biopsy/resection, right lingula and small portion of middle lobe - METASTATIC ADENOCARCINOMA, CONSISTENT WITH COLORECTAL PRIMARY, SPANNING 2.0 CM. - THE SURGICAL RESECTION MARGINS ARE NEGATIVE FOR ADENOCARCINOMA.   11/16/2014 Remission   THE SURGICAL RESECTION MARGINS ARE NEGATIVE FOR ADENOCARCINOMA.   03/07/2015 Imaging   CT CAP- Interval resection of right middle lobe metastasis. No acute process or evidence of metastatic disease in the chest, abdomen or pelvis. Improved right upper lobe reticular nodular opacities are favored to represent resolving infection.   09/14/2015 Imaging   CT CAP- No findings of recurrent malignancy. No recurrence along the wedge resection site of the right middle lobe. Right anterior abdominal wall focal hernia containing a knuckle of small bowel without complicating feature.   06/13/2016 Imaging   CT CAP- 1. New right-sided pleural metastasis/mass. Other smaller right-sided pulmonary nodules which are all pleural-based and most likely represent pleural metastasis. 2. No convincing evidence of abdominopelvic nodal metastasis. 3. Constellation of findings, including pancreatic atrophy, duct dilatation, and pancreatic head soft tissue fullness which are highly suspicious for pancreatic adenocarcinoma. Metastatic disease felt much less likely. Consider endoscopic ultrasound sampling or ERCP. Cannot exclude superimposed acute pancreatitis. 4. New enlargement of the appendiceal tip with subtle surrounding edema. Cannot exclude early or mild appendicitis. 5. Coronary artery atherosclerosis. Aortic atherosclerosis. 6. 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   06/13/2016 Progression   CT scan demonstrates progression of disease   06/19/2016 Procedure   EUS with FNA by Dr. Ardis Hughs   06/20/2016 Pathology Results   FINE NEEDLE ASPIRATION, ENDOSCOPIC, PANCREAS UNCINATE AREA(SPECIMEN 1 OF 1 COLLECTED 06/19/16):  MALIGNANT CELLS CONSISTENT WITH METASTATIC ADENOCARCINOMA.  06/25/2016 PET scan   1. Two pleural-based nodules in the right hemithorax are hypermetabolic. Metastatic disease is a distinct consideration. The scattered pulmonary parenchymal nodule seen on previous diagnostic CT imaging are below the threshold for reliable resolution on PET imaging. 2. Hypermetabolic lesion pancreatic head, consistent with neoplasm. As noted on prior CT, adenocarcinoma is any consideration. No evidence for hypermetabolic abdominal lymphadenopathy. Mottled uptake noted in the liver, but no discrete hepatic metastases are evident on PET imaging. 3. Appendix remains distended up to 0.9-10 mm diameter with a stone towards the tip in subtle periappendiceal edema/inflammation. The appendix is hypermetabolic along its length. Imaging features are relatively stable in the 12 day interval since prior CT scan. While appendicitis is a consideration, the relative stability over 12 days would be unusual for that etiology. Continued close follow-up recommended.   06/26/2016 Pathology Results   Not enough tissue for foundationONE or K-ras testing.   07/07/2016 Procedure   Port placed by Dr. Arnoldo Morale   07/08/2016 -  Chemotherapy   The patient had pegfilgrastim (NEULASTA) injection 6 mg, 6 mg, Subcutaneous,  Once, 0 of 4 cycles  ondansetron (ZOFRAN) IVPB 8 mg, 8 mg, Intravenous,  Once, 0 of 4 cycles  leucovorin 804 mg in dextrose 5 % 250 mL infusion, 400 mg/m2, Intravenous,  Once, 0 of 4 cycles  oxaliplatin (ELOXATIN) 170 mg in dextrose 5 % 500 mL chemo infusion, 85 mg/m2, Intravenous,  Once, 0 of 4 cycles  fluorouracil (ADRUCIL) 4,800 mg in sodium chloride 0.9 % 150 mL chemo infusion, 2,400 mg/m2 = 4,800 mg, Intravenous, 1 Day/Dose, 0 of 4 cycles  fluorouracil (ADRUCIL) chemo injection 800 mg, 400 mg/m2, Intravenous,  Once, 0 of 4 cycles  pegfilgrastim (NEULASTA) injection 6 mg, 6 mg, Subcutaneous,  Once, 2 of 2  cycles  ondansetron (ZOFRAN) IVPB 8 mg, 8 mg, Intravenous,  Once, 12 of 12 cycles  leucovorin 660 mg in dextrose 5 % 250 mL infusion, 660 mg (original dose ), Intravenous,  Once, 12 of 12 cycles Dose modification: 660 mg (Cycle 1)  oxaliplatin (ELOXATIN) 140 mg in dextrose 5 % 500 mL chemo infusion, 140 mg (original dose ), Intravenous,  Once, 12 of 12 cycles Dose modification: 140 mg (Cycle 1), 119 mg (85 % of original dose 140 mg, Cycle 8, Reason: Provider Judgment)  fluorouracil (ADRUCIL) 3,850 mg in sodium chloride 0.9 % 150 mL chemo infusion, 3,850 mg (original dose ), Intravenous, 1 Day/Dose, 12 of 12 cycles Dose modification: 3,850 mg (Cycle 1)  fluorouracil (ADRUCIL) chemo injection 700 mg, 700 mg (original dose ), Intravenous,  Once, 12 of 12 cycles Dose modification: 700 mg (Cycle 1)  palonosetron (ALOXI) injection 0.25 mg, 0.25 mg, Intravenous,  Once, 7 of 12 cycles Administration: 0.25 mg (09/30/2016)  bevacizumab (AVASTIN) 475 mg in sodium chloride 0.9 % 100 mL chemo infusion, 5 mg/kg = 475 mg, Intravenous,  Once, 6 of 11 cycles Administration: 500 mg (09/30/2016)  leucovorin 880 mg in dextrose 5 % 250 mL infusion, 400 mg/m2 = 880 mg, Intravenous,  Once, 7 of 12 cycles Administration: 880 mg (09/30/2016)  oxaliplatin (ELOXATIN) 185 mg in dextrose 5 % 500 mL chemo infusion, 85 mg/m2 = 185 mg, Intravenous,  Once, 7 of 12 cycles Administration: 185 mg (09/30/2016)  fluorouracil (ADRUCIL) chemo injection 900 mg, 400 mg/m2 = 900 mg, Intravenous,  Once, 7 of 12 cycles Administration: 900 mg (09/30/2016)  fluorouracil (ADRUCIL) 5,300 mg in sodium chloride 0.9 % 144 mL chemo infusion, 2,400 mg/m2 = 5,300  mg, Intravenous, 1 Day/Dose, 7 of 12 cycles Administration: 5,300 mg (09/30/2016)  for chemotherapy treatment.     08/27/2016 Imaging   CT CAP- 1. Pulmonary metastatic disease is stable. 2. Pancreatic head mass, grossly stable, with progressive atrophy of the body and tail of the  pancreas. Stable peripancreatic lymph node. 3. Mild circumferential rectal wall thickening, stable. 4. Aortic atherosclerosis (ICD10-170.0). Coronary artery calcification. 5. Hyperattenuating lesion off the lower pole left kidney, stable, too small to characterize. Continued attention on followup exams is warranted. 6. Persistent wall thickening and mild dilatation of the proximal appendix, of uncertain etiology. Continued attention on followup exams is warranted.   11/06/2016 Imaging   CT C/A/P: IMPRESSION: 1. Stable pulmonary metastatic disease. 2. Similar appearing pancreatic head mass. Progressed atrophy of the pancreatic body and tail. Stable peripancreatic lymph node. 3. Stable hypoattenuating lesion partially exophytic off the inferior pole of the left kidney. 4. Aortic atherosclerosis. 5. Persistent mild wall thickening and mild dilatation of the appendix.    01/30/2017 Imaging   Ct C/A/P: IMPRESSION: 1. Stable pulmonary metastatic disease. 2. Stable to slight increase in size of pancreatic head mass. 3.  Aortic Atherosclerosis (ICD10-I70.0). 4. Similar appearance of mild wall thickening of the appendix which contains an appendicolith.   05/01/2017 Imaging   CT C/A/P: Stable disease in the lungs, no progression of disease in the abdomen.   10/04/2018 -  Chemotherapy   The patient had palonosetron (ALOXI) injection 0.25 mg, 0.25 mg, Intravenous,  Once, 6 of 8 cycles Administration: 0.25 mg (10/04/2018), 0.25 mg (10/25/2018), 0.25 mg (12/01/2018), 0.25 mg (12/15/2018), 0.25 mg (12/29/2018), 0.25 mg (01/12/2019) pegfilgrastim (NEULASTA) injection 6 mg, 6 mg, Subcutaneous, Once, 2 of 2 cycles Administration: 6 mg (10/27/2018) pegfilgrastim-cbqv (UDENYCA) injection 6 mg, 6 mg, Subcutaneous, Once, 4 of 6 cycles Administration: 6 mg (12/03/2018), 6 mg (12/17/2018), 6 mg (12/31/2018) bevacizumab (AVASTIN) 450 mg in sodium chloride 0.9 % 100 mL chemo infusion, 5 mg/kg = 450 mg, Intravenous,   Once, 4 of 4 cycles Administration: 400 mg (12/01/2018), 400 mg (12/15/2018), 400 mg (12/29/2018) irinotecan (CAMPTOSAR) 380 mg in sodium chloride 0.9 % 500 mL chemo infusion, 180 mg/m2 = 380 mg, Intravenous,  Once, 6 of 8 cycles Dose modification: 144 mg/m2 (original dose 180 mg/m2, Cycle 2, Reason: Provider Judgment, Comment: 20% dose reduction), 90 mg/m2 (50 % of original dose 180 mg/m2, Cycle 3, Reason: Other (see comments), Comment: [id number]) Administration: 380 mg (10/04/2018), 300 mg (10/25/2018), 200 mg (12/01/2018), 200 mg (12/15/2018), 200 mg (12/29/2018), 200 mg (01/12/2019) leucovorin 900 mg in sodium chloride 0.9 % 250 mL infusion, 421 mg/m2 = 856 mg, Intravenous,  Once, 6 of 8 cycles Dose modification: 320 mg/m2 (original dose 400 mg/m2, Cycle 2, Reason: Provider Judgment, Comment: 20% dose reduction) Administration: 900 mg (10/04/2018), 700 mg (10/25/2018), 700 mg (12/01/2018), 700 mg (12/15/2018), 700 mg (12/29/2018), 700 mg (01/12/2019) fluorouracil (ADRUCIL) chemo injection 850 mg, 400 mg/m2 = 850 mg, Intravenous,  Once, 6 of 8 cycles Dose modification: 320 mg/m2 (original dose 400 mg/m2, Cycle 2, Reason: Provider Judgment, Comment: 20% dose reduction) Administration: 850 mg (10/04/2018), 700 mg (10/25/2018), 700 mg (12/01/2018), 700 mg (12/15/2018), 700 mg (12/29/2018), 700 mg (01/12/2019) fosaprepitant (EMEND) 150 mg, dexamethasone (DECADRON) 12 mg in sodium chloride 0.9 % 145 mL IVPB, , Intravenous,  Once, 4 of 6 cycles Administration:  (12/01/2018),  (12/15/2018),  (12/29/2018),  (01/12/2019) fluorouracil (ADRUCIL) 5,000 mg in sodium chloride 0.9 % 150 mL chemo infusion, 2,325 mg/m2 = 5,150 mg, Intravenous, 1 Day/Dose,  6 of 8 cycles Dose modification: 1,920 mg/m2 (original dose 2,400 mg/m2, Cycle 2, Reason: Provider Judgment, Comment: 20% dose reduction) Administration: 5,000 mg (10/04/2018), 4,100 mg (10/25/2018), 4,100 mg (12/01/2018), 4,100 mg (12/15/2018), 4,100 mg (12/29/2018), 4,100 mg (01/12/2019)  bevacizumab-bvzr (ZIRABEV) 400 mg in sodium chloride 0.9 % 100 mL chemo infusion, 5 mg/kg = 400 mg (100 % of original dose 5 mg/kg), Intravenous,  Once, 1 of 3 cycles Dose modification: 5 mg/kg (original dose 5 mg/kg, Cycle 6) Administration: 400 mg (01/12/2019)  for chemotherapy treatment.       CANCER STAGING: Cancer Staging Rectal cancer ([place]) Staging form: Colon and Rectum, AJCC 7th Edition - Clinical: Stage IIIB (T3, N1, M0) - Signed by [name], MD on 02/04/2011 - Pathologic stage from 11/17/2014: Stage IVA (M1a) - Signed by Baird Cancer, PA-C on 09/14/2015    INTERVAL HISTORY:  Mr. Gayton 68 y.o. male seen for follow-up and next cycle of chemotherapy.  Appetite and energy levels are 75%.  He is drinking Ensure 4 times a day.  He had more diarrhea from the colostomy bag since last cycle.  Numbness in the hands and feet has been stable.  He did not take anything for diarrhea because he ran out of Lomotil.  He requests refill for it.  Denies any shortness of breath on exertion.  No nausea or vomiting reported.  REVIEW OF SYSTEMS:  Review of Systems  Gastrointestinal: Positive for diarrhea.  Neurological: Positive for dizziness and numbness.  All other systems reviewed and are negative.    PAST MEDICAL/SURGICAL HISTORY:  Past Medical History:  Diagnosis Date  . Chronic anticoagulation   . Colon cancer ([place]) 07/24/2010   rectal ca, inv adenocarcinoma  . Depression 04/01/2011  . Diabetes mellitus without complication ([place])   . DVT (deep venous thrombosis) ([place]) 05/09/2011  . GERD (gastroesophageal reflux disease)   . High output ileostomy ([place]) 05/21/2011  . History of kidney stones   . HTN (hypertension)   . Hx of radiation therapy 09/02/10 to 10/14/10   pelvis  . Lung metastasis ([place])   . Neuropathy   . Peripheral vascular disease ([place])    dvt's,pe  . Pneumonia    hx x3  . Pulmonary embolism ([place])   . Rectal cancer ([place]) 01/15/2011   S/P radiation and  concurrent 5-FU continuous infusion from 09/09/10- 10/10/10.  S/P proctectomy with colorectal anastomosis and diverting loop ileostomy on 11/14/10 at [facility] by Dr. Harlon Ditty. Pathology reveals a pT3b N1 with 3/20 lymph nodes.     Past Surgical History:  Procedure Laterality Date  . BILIARY STENT PLACEMENT N/A 09/16/2018   Procedure: STENT PLACEMENT;  Surgeon: Rogene Houston, MD;  [facility];  Service: Endoscopy;  Laterality: N/A;  . BIOPSY  09/24/2018   Procedure: BIOPSY;  Surgeon: Danie Binder, MD;  [facility];  Service: Endoscopy;;  . COLON SURGERY  11/14/2010   proctectomy with colorectal anastomosis and diverting loop ileostomy (temporary planned)  . COLONOSCOPY  07/2010   proximal rectal apple core mass 10-14cm from anal verge (adenocarcinoma), 2-3cm distal rectal carpet polyp s/p piecemeal snare polypectomy (adenoma)  . COLONOSCOPY  04/21/2012   RMR: Friable,fibrotic appearing colorectal anastomosis producing some luminal narrowing-not felt to be critical. path: focal erosion with slight inflammation and hyperemia. SURVEILLANCE DUE DEC 2015  . COLONOSCOPY N/A 11/24/2013   Dr. Rourk:somewhat fibrotic/friable anastomotic mucosal-status post biopsy (narrowing not felt to be clinically significant) Single colonic diverticulum. benign polypoid rectal  mucosa  . colostomy reversal  april 2013  . ERCP N/A 09/16/2018   Procedure: ENDOSCOPIC RETROGRADE CHOLANGIOPANCREATOGRAPHY (ERCP);  Surgeon: Rogene Houston, MD;  [facility];  Service: Endoscopy;  Laterality: N/A;  . ESOPHAGOGASTRODUODENOSCOPY  07/2010   RMR: schatki ring s/p dilation, small hh, SB bx benign  . ESOPHAGOGASTRODUODENOSCOPY N/A 09/04/2015   Procedure: ESOPHAGOGASTRODUODENOSCOPY (EGD);  Surgeon: Daneil Dolin, MD;  [facility];  Service: Endoscopy;  Laterality: N/A;  730  . EUS  08/2010   Dr. Owens Loffler. uT3N0 circumferential, nearly obstruction rectosigmoid adenocarcinoma,  distal edge 12cm from anal verge  . EUS N/A 06/19/2016   Procedure: UPPER ENDOSCOPIC ULTRASOUND (EUS) RADIAL;  Surgeon: Milus Banister, MD;  [facility];  Service: Endoscopy;  Laterality: N/A;  . FLEXIBLE SIGMOIDOSCOPY N/A 09/24/2018   Procedure: FLEXIBLE SIGMOIDOSCOPY;  Surgeon: Danie Binder, MD;  [facility];  Service: Endoscopy;  Laterality: N/A;  . HERNIA REPAIR     abd hernia repair  . IVC filter    . ivc filter    . port a cath placement    . PORT-A-CATH REMOVAL  09/24/2011   Procedure: REMOVAL PORT-A-CATH;  Surgeon: Donato Heinz, MD;  Location: [facility];  Service: General;  Laterality: N/A;  Minor Room  . PORTACATH PLACEMENT Right 07/07/2016   Procedure: INSERTION PORT-A-CATH;  Surgeon: Aviva Signs, MD;  Location: [facility];  Service: General;  Laterality: Right;  . SPHINCTEROTOMY N/A 09/16/2018   Procedure: SPHINCTEROTOMY with balloon dialation;  Surgeon: Rogene Houston, MD;  [facility];  Service: Endoscopy;  Laterality: N/A;  . TRANSVERSE LOOP COLOSTOMY N/A 09/29/2018   Procedure: TRANSVERSE LOOP COLOSTOMY;  Surgeon: Aviva Signs, MD;  Location: [facility];  Service: General;  Laterality: N/A;  . VIDEO ASSISTED THORACOSCOPY (VATS)/WEDGE RESECTION Right 11/15/2014   Procedure: VIDEO ASSISTED THORACOSCOPY (VATS)/LUNG RESECTION WITH RIGHT LINGULECTOMY;  Surgeon: [name], MD;  Location: [place];  Service: Thoracic;  Laterality: Right;  Marland Kitchen VIDEO BRONCHOSCOPY N/A 11/15/2014   Procedure: VIDEO BRONCHOSCOPY;  Surgeon: [name], MD;  Location: [facility] OR;  Service: Thoracic;  Laterality: N/A;     SOCIAL HISTORY:  Social History   Socioeconomic History  . Marital status: Married    Spouse name: Not on file  . Number of children: 2  . Years of education: Not on file  . Highest education level: Not on file  Occupational History  . Occupation: self-employed Regulatory affairs officer, currently not working    Fish farm manager: SELF EMPLOYED  Social  Needs  . Financial resource strain: Not very hard  . Food insecurity    Worry: Never true    Inability: Never true  . Transportation needs    Medical: No    Non-medical: No  Tobacco Use  . Smoking status: Never Smoker  . Smokeless tobacco: Never Used  Substance and Sexual Activity  . Alcohol use: No  . Drug use: No  . Sexual activity: Yes    Birth control/protection: None    Comment: married  Lifestyle  . Physical activity    Days per week: 0 days    Minutes per session: 0 min  . Stress: Only a little  Relationships  . Social connections    Talks on phone: More than three times a week    Gets together: Once a week    Attends religious service: Patient refused    Active member of club or organization: Patient refused  Attends meetings of clubs or organizations: Patient refused    Relationship status: Patient refused  . Intimate partner violence    Fear of current or ex partner: Patient refused    Emotionally abused: Patient refused    Physically abused: Patient refused    Forced sexual activity: Patient refused  Other Topics Concern  . Not on file  Social History Narrative  . Not on file    FAMILY HISTORY:  Family History  Problem Relation Age of Onset  . Cancer Brother        throat  . Cancer Brother        prostate  . Colon cancer Neg Hx   . Liver disease Neg Hx   . Inflammatory bowel disease Neg Hx     CURRENT MEDICATIONS:  Outpatient Encounter Medications as of 01/12/2019  Medication Sig  . acetaminophen (TYLENOL) 500 MG tablet Take 1,000 mg by mouth every 6 (six) hours as needed for mild pain or moderate pain.   . clonazePAM (KLONOPIN) 1 MG tablet Take 1 tablet by mouth three times daily as needed  . Diphenhyd-Hydrocort-Nystatin (FIRST-DUKES MOUTHWASH) SUSP Use as directed 5 mLs in the mouth or throat 4 (four) times daily.  . diphenoxylate-atropine (LOMOTIL) 2.5-0.025 MG tablet Take 1 tablet by mouth 4 (four) times daily as needed for diarrhea or loose  stools.  Marland Kitchen dronabinol (MARINOL) 5 MG capsule Take 1 capsule (5 mg total) by mouth 2 (two) times daily before a meal.  . gabapentin (NEURONTIN) 300 MG capsule Take 3 capsules (900 mg total) by mouth 2 (two) times daily.  Marland Kitchen glimepiride (AMARYL) 2 MG tablet Take 2 mg by mouth 2 (two) times daily before a meal.   . hydrocortisone (CORTEF) 20 MG tablet Take 20 mg by mouth daily.   Marland Kitchen levofloxacin (LEVAQUIN) 500 MG tablet Take 1 tablet (500 mg total) by mouth daily.  . megestrol (MEGACE) 400 MG/10ML suspension Take 10 mLs (400 mg total) by mouth 2 (two) times daily.  . mirtazapine (REMERON) 7.5 MG tablet Take 7.5 mg by mouth at bedtime.   Marland Kitchen omeprazole (PRILOSEC) 20 MG capsule Take 20 mg by mouth 2 (two) times daily before a meal.   . ondansetron (ZOFRAN) 8 MG tablet Take 1 tablet (8 mg total) by mouth 2 (two) times daily as needed for refractory nausea / vomiting. Start on day 3 after chemotherapy.  Marland Kitchen oxyCODONE-acetaminophen (PERCOCET) 10-325 MG tablet Take 1 tablet by mouth every 6 (six) hours as needed for pain.  . pantoprazole (PROTONIX) 40 MG tablet Take 40 mg by mouth daily.   . Probiotic Product (PROBIOTIC DAILY PO) Take 1 capsule by mouth daily.  . prochlorperazine (COMPAZINE) 10 MG tablet Take 1 tablet (10 mg total) by mouth every 6 (six) hours as needed for nausea or vomiting.  . promethazine (PHENERGAN) 25 MG tablet Take 1 tablet (25 mg total) by mouth every 8 (eight) hours as needed for refractory nausea / vomiting.  . rivaroxaban (XARELTO) 20 MG TABS tablet Take 1 tablet (20 mg total) by mouth daily with supper.  . traZODone (DESYREL) 100 MG tablet Take 1 tablet (100 mg total) by mouth at bedtime.  . vitamin B-12 (CYANOCOBALAMIN) 1000 MCG tablet Take 1,000 mcg by mouth daily.  . [DISCONTINUED] diphenoxylate-atropine (LOMOTIL) 2.5-0.025 MG tablet Take 1 tablet by mouth 4 (four) times daily as needed for diarrhea or loose stools.  . [DISCONTINUED] diphenoxylate-atropine (LOMOTIL) 2.5-0.025 MG  tablet Take 1 tablet by mouth 4 (four) times daily as  needed for diarrhea or loose stools.   Facility-Administered Encounter Medications as of 01/12/2019  Medication  . sodium chloride 0.9 % injection 10 mL  . sodium chloride flush (NS) 0.9 % injection 10 mL    ALLERGIES:  Allergies  Allergen Reactions  . Oxycodone     Blisters, hallucinations  . Tramadol     Blisters, hallucinations  . Trazodone And Nefazodone Other (See Comments)    hallucinations     PHYSICAL EXAM:  ECOG Performance status: 1  Vitals:   01/12/19 0833  BP: 111/69  Pulse: 89  Resp: 16  Temp: (!) 97.1 F (36.2 C)  SpO2: 99%   Filed Weights   01/12/19 0833  Weight: 171 lb 3.2 oz (77.7 kg)    Physical Exam Constitutional:      Appearance: Normal appearance.  HENT:     Head: Normocephalic.     Nose: Nose normal.     Mouth/Throat:     Mouth: Mucous membranes are moist.     Pharynx: Oropharynx is clear.  Eyes:     Extraocular Movements: Extraocular movements intact.     Conjunctiva/sclera: Conjunctivae normal.  Neck:     Musculoskeletal: Normal range of motion.  Cardiovascular:     Rate and Rhythm: Regular rhythm. Tachycardia present.     Pulses: Normal pulses.     Heart sounds: Normal heart sounds.  Pulmonary:     Effort: Pulmonary effort is normal.     Breath sounds: Normal breath sounds.  Abdominal:     General: Abdomen is flat. Bowel sounds are normal.     Palpations: Abdomen is soft.  Musculoskeletal: Normal range of motion.  Skin:    General: Skin is warm and dry.  Neurological:     General: No focal deficit present.     Mental Status: He is alert and oriented to person, place, and time.  Psychiatric:        Mood and Affect: Mood normal.        Behavior: Behavior normal.        Thought Content: Thought content normal.        Judgment: Judgment normal.      LABORATORY DATA:  I have reviewed the labs as listed.  CBC    Component Value Date/Time   WBC 10.3 01/12/2019 0824    RBC 4.02 (L) 01/12/2019 0824   HGB 12.1 (L) 01/12/2019 0824   HGB 11.6 (L) 02/04/2011 1059   HCT 38.5 (L) 01/12/2019 0824   HCT 34.7 (L) 02/04/2011 1059   PLT 230 01/12/2019 0824   PLT 282 02/04/2011 1059   MCV 95.8 01/12/2019 0824   MCV 76.0 (L) 02/04/2011 1059   MCH 30.1 01/12/2019 0824   MCHC 31.4 01/12/2019 0824   RDW 15.1 01/12/2019 0824   RDW 18.5 (H) 02/04/2011 1059   LYMPHSABS 1.7 01/12/2019 0824   LYMPHSABS 1.1 02/04/2011 1059   MONOABS 0.7 01/12/2019 0824   MONOABS 0.5 02/04/2011 1059   EOSABS 0.4 01/12/2019 0824   EOSABS 0.1 02/04/2011 1059   BASOSABS 0.1 01/12/2019 0824   BASOSABS 0.1 02/04/2011 1059   CMP Latest Ref Rng & Units 01/12/2019 12/29/2018 12/15/2018  Glucose 70 - 99 mg/dL 185(H) 325(H) 225(H)  BUN 8 - 23 mg/dL '10 8 15  '$ Creatinine 0.61 - 1.24 mg/dL 0.65 0.79 0.66  Sodium 135 - 145 mmol/L 136 136 138  Potassium 3.5 - 5.1 mmol/L 4.2 3.9 3.9  Chloride 98 - 111 mmol/L 105 105 106  CO2 22 -  32 mmol/L 24 21(L) 23  Calcium 8.9 - 10.3 mg/dL 8.2(L) 8.1(L) 8.9  Total Protein 6.5 - 8.1 g/dL 5.5(L) 5.5(L) 6.1(L)  Total Bilirubin 0.3 - 1.2 mg/dL 0.4 0.5 0.6  Alkaline Phos 38 - 126 U/L 115 109 89  AST 15 - 41 U/L '17 22 19  '$ ALT 0 - 44 U/L '17 19 23    '$ I have personally reviewed the scans and discussed with the patient.   ASSESSMENT & PLAN:   Rectal cancer 1.  Stage IV rectal cancer with lung and pancreatic metastasis: Foundation 1 CDX shows KRAS G13D and NRAS wild type, MS-stable. -12 cycles of FOLFOX in the adjuvant setting from October 12 through 07/14/2011. -15 cycles of FOLFOX and bevacizumab from 07/08/2016 through 01/20/2017 followed by infusional 5-FU and bevacizumab until 07/14/2017. -Maintenance Xeloda from 08/07/2017 with bevacizumab through 08/19/2018 with progression. - ERCP and stent placement on 09/16/2018 showing single localized biliary stricture.  Biliary sphincterotomy was performed with stricture dilated to 6 mm with balloon to facilitate stent  placement.  1 covered metal stent was placed. -Flexible sigmoidoscopy on 09/24/2018 showing narrowing of the colorectal anastomosis.  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 - 2 cycles of FOLFIRI from 10/04/2018 through 10/25/2018 with 20% dose reduction during cycle 2. - He has developed severe tiredness after cycle 2 even after dose reduction. 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  - Cycle 3 and cycle 4 with 50% dose reduction of Irinotecan on 12/01/2018 and 12/15/2018. - He has tolerated last cycle reasonably well.  However he had more diarrhea.  He did not have any diarrhea pills and could not take them. - He did not lose any weight since last visit.  Hence we will proceed with his next cycle.  We have sent a prescription for Lomotil. -We will reevaluate him in 2 weeks.  2.  Weight loss: -He is taking Megace twice daily.  He is drinking Ensure 4 cans/day. -He is continuing to gain weight.  3.  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  4.  Anxiety: -He takes Klonopin 1 mg twice daily.   Total time spent is 25 minutes with more than 50% of the time spent face-to-face discussing treatment plan, counseling and coordination of care.  Orders placed this encounter:  Orders Placed This Encounter  Procedures  . CBC with Differential/Platelet  . Comprehensive metabolic panel  . CEA      Derek Jack, MD  [place] [phone]

## 2019-01-12 NOTE — Patient Instructions (Addendum)
[facility] at [facility] Discharge Instructions  You were seen today by Dr. Katragadda. He went over your recent lab results. He will see you back in 2 weeks for labs and follow up.   Thank you for choosing [facility] at [facility] to provide your oncology and hematology care.  To afford each patient quality time with our provider, please arrive at least 15 minutes before your scheduled appointment time.   If you have a lab appointment with the Cancer Center please come in thru the  Main Entrance and check in at the main information desk  You need to re-schedule your appointment should you arrive 10 or more minutes late.  We strive to give you quality time with our providers, and arriving late affects you and other patients whose appointments are after yours.  Also, if you no show three or more times for appointments you may be dismissed from the clinic at the providers discretion.     Again, thank you for choosing [facility].  Our hope is that these requests will decrease the amount of time that you wait before being seen by our physicians.       _____________________________________________________________  Should you have questions after your visit to [facility], please contact our office at [phone] between the hours of 8:00 a.m. and 4:30 p.m.  Voicemails left after 4:00 p.m. will not be returned until the following business day.  For prescription refill requests, have your pharmacy contact our office and allow 72 hours.    Cancer Center Support Programs:   > Cancer Support Group  2nd Tuesday of the month 1pm-2pm, Journey Room    

## 2019-01-13 DIAGNOSIS — E119 Type 2 diabetes mellitus without complications: Secondary | ICD-10-CM | POA: Diagnosis not present

## 2019-01-13 DIAGNOSIS — I10 Essential (primary) hypertension: Secondary | ICD-10-CM | POA: Diagnosis not present

## 2019-01-14 ENCOUNTER — Inpatient Hospital Stay (HOSPITAL_COMMUNITY): Payer: Medicare Other

## 2019-01-14 ENCOUNTER — Encounter (HOSPITAL_COMMUNITY): Payer: Self-pay

## 2019-01-14 ENCOUNTER — Other Ambulatory Visit: Payer: Self-pay

## 2019-01-14 VITALS — BP 126/71 | HR 64 | Temp 98.4°F | Resp 18

## 2019-01-14 DIAGNOSIS — Z5111 Encounter for antineoplastic chemotherapy: Secondary | ICD-10-CM | POA: Diagnosis not present

## 2019-01-14 DIAGNOSIS — C7800 Secondary malignant neoplasm of unspecified lung: Secondary | ICD-10-CM | POA: Diagnosis not present

## 2019-01-14 DIAGNOSIS — R197 Diarrhea, unspecified: Secondary | ICD-10-CM | POA: Diagnosis not present

## 2019-01-14 DIAGNOSIS — C20 Malignant neoplasm of rectum: Secondary | ICD-10-CM

## 2019-01-14 DIAGNOSIS — Z5112 Encounter for antineoplastic immunotherapy: Secondary | ICD-10-CM | POA: Diagnosis not present

## 2019-01-14 DIAGNOSIS — Z5189 Encounter for other specified aftercare: Secondary | ICD-10-CM | POA: Diagnosis not present

## 2019-01-14 MED ORDER — SODIUM CHLORIDE 0.9% FLUSH
10.0000 mL | INTRAVENOUS | Status: DC | PRN
  Administered 2019-01-14: 14:00:00 10 mL
  Filled 2019-01-14: qty 10

## 2019-01-14 MED ORDER — HEPARIN SOD (PORK) LOCK FLUSH 100 UNIT/ML IV SOLN
500.0000 [IU] | Freq: Once | INTRAVENOUS | Status: AC | PRN
  Administered 2019-01-14: 500 [IU]

## 2019-01-14 MED ORDER — PEGFILGRASTIM-CBQV 6 MG/0.6ML ~~LOC~~ SOSY
6.0000 mg | PREFILLED_SYRINGE | Freq: Once | SUBCUTANEOUS | Status: AC
  Administered 2019-01-14: 6 mg via SUBCUTANEOUS

## 2019-01-14 MED ORDER — PEGFILGRASTIM-CBQV 6 MG/0.6ML ~~LOC~~ SOSY
PREFILLED_SYRINGE | SUBCUTANEOUS | Status: AC
  Filled 2019-01-14: qty 0.6

## 2019-01-14 NOTE — Patient Instructions (Signed)
[facility] at [facility]  Discharge Instructions:   _______________________________________________________________  Thank you for choosing [facility] at [facility] to provide your oncology and hematology care.  To afford each patient quality time with our providers, please arrive at least 15 minutes before your scheduled appointment.  You need to re-schedule your appointment if you arrive 10 or more minutes late.  We strive to give you quality time with our providers, and arriving late affects you and other patients whose appointments are after yours.  Also, if you no show three or more times for appointments you may be dismissed from the clinic.  Again, thank you for choosing [facility] at [facility]. Our hope is that these requests will allow you access to exceptional care and in a timely manner. _______________________________________________________________  If you have questions after your visit, please contact our office at [phone] between the hours of 8:30 a.m. and 5:00 p.m. Voicemails left after 4:30 p.m. will not be returned until the following business day. _______________________________________________________________  For prescription refill requests, have your pharmacy contact our office. _______________________________________________________________  Recommendations made by the consultant and any test results will be sent to your referring physician. _______________________________________________________________ 

## 2019-01-14 NOTE — Progress Notes (Signed)
Patients port flushed without difficulty.  Good blood return noted with no bruising or swelling noted at site.  Band aid applied.  VSS with discharge and left ambulatory with no s/s of distress noted.  

## 2019-01-14 NOTE — Progress Notes (Signed)
Nutrition  RD was planning to see patient at pump removal today.  Patient left before seeing RD.  Called and left message on voicemail.   Charles [name], [place], [place] Registered Dietitian [phone] (pager)

## 2019-01-19 DIAGNOSIS — C7889 Secondary malignant neoplasm of other digestive organs: Secondary | ICD-10-CM | POA: Diagnosis not present

## 2019-01-19 DIAGNOSIS — Z432 Encounter for attention to ileostomy: Secondary | ICD-10-CM | POA: Diagnosis not present

## 2019-01-19 DIAGNOSIS — C7800 Secondary malignant neoplasm of unspecified lung: Secondary | ICD-10-CM | POA: Diagnosis not present

## 2019-01-19 DIAGNOSIS — E1151 Type 2 diabetes mellitus with diabetic peripheral angiopathy without gangrene: Secondary | ICD-10-CM | POA: Diagnosis not present

## 2019-01-19 DIAGNOSIS — C20 Malignant neoplasm of rectum: Secondary | ICD-10-CM | POA: Diagnosis not present

## 2019-01-19 DIAGNOSIS — K9189 Other postprocedural complications and disorders of digestive system: Secondary | ICD-10-CM | POA: Diagnosis not present

## 2019-01-25 ENCOUNTER — Ambulatory Visit (INDEPENDENT_AMBULATORY_CARE_PROVIDER_SITE_OTHER): Payer: Medicare Other | Admitting: General Surgery

## 2019-01-25 ENCOUNTER — Other Ambulatory Visit: Payer: Self-pay

## 2019-01-25 ENCOUNTER — Encounter: Payer: Self-pay | Admitting: General Surgery

## 2019-01-25 VITALS — BP 122/79 | HR 83 | Temp 97.1°F | Resp 16 | Ht 74.0 in | Wt 168.0 lb

## 2019-01-25 DIAGNOSIS — C7889 Secondary malignant neoplasm of other digestive organs: Secondary | ICD-10-CM | POA: Diagnosis not present

## 2019-01-25 DIAGNOSIS — K9189 Other postprocedural complications and disorders of digestive system: Secondary | ICD-10-CM | POA: Diagnosis not present

## 2019-01-25 DIAGNOSIS — E1151 Type 2 diabetes mellitus with diabetic peripheral angiopathy without gangrene: Secondary | ICD-10-CM | POA: Diagnosis not present

## 2019-01-25 DIAGNOSIS — C20 Malignant neoplasm of rectum: Secondary | ICD-10-CM | POA: Diagnosis not present

## 2019-01-25 DIAGNOSIS — K9402 Colostomy infection: Secondary | ICD-10-CM | POA: Diagnosis not present

## 2019-01-25 DIAGNOSIS — Z432 Encounter for attention to ileostomy: Secondary | ICD-10-CM | POA: Diagnosis not present

## 2019-01-25 DIAGNOSIS — C7800 Secondary malignant neoplasm of unspecified lung: Secondary | ICD-10-CM | POA: Diagnosis not present

## 2019-01-26 ENCOUNTER — Inpatient Hospital Stay (HOSPITAL_COMMUNITY): Payer: Medicare Other

## 2019-01-26 ENCOUNTER — Inpatient Hospital Stay (HOSPITAL_BASED_OUTPATIENT_CLINIC_OR_DEPARTMENT_OTHER): Payer: Medicare Other | Admitting: Hematology

## 2019-01-26 ENCOUNTER — Encounter (HOSPITAL_COMMUNITY): Payer: Self-pay | Admitting: Hematology

## 2019-01-26 VITALS — BP 107/58 | HR 75 | Temp 97.1°F | Resp 18

## 2019-01-26 DIAGNOSIS — R197 Diarrhea, unspecified: Secondary | ICD-10-CM | POA: Diagnosis not present

## 2019-01-26 DIAGNOSIS — R195 Other fecal abnormalities: Secondary | ICD-10-CM

## 2019-01-26 DIAGNOSIS — C20 Malignant neoplasm of rectum: Secondary | ICD-10-CM | POA: Diagnosis not present

## 2019-01-26 DIAGNOSIS — Z5112 Encounter for antineoplastic immunotherapy: Secondary | ICD-10-CM | POA: Diagnosis not present

## 2019-01-26 DIAGNOSIS — C7800 Secondary malignant neoplasm of unspecified lung: Secondary | ICD-10-CM | POA: Diagnosis not present

## 2019-01-26 DIAGNOSIS — Z5189 Encounter for other specified aftercare: Secondary | ICD-10-CM | POA: Diagnosis not present

## 2019-01-26 DIAGNOSIS — Z5111 Encounter for antineoplastic chemotherapy: Secondary | ICD-10-CM | POA: Diagnosis not present

## 2019-01-26 LAB — COMPREHENSIVE METABOLIC PANEL
ALT: 16 U/L (ref 0–44)
AST: 15 U/L (ref 15–41)
Albumin: 2.9 g/dL — ABNORMAL LOW (ref 3.5–5.0)
Alkaline Phosphatase: 110 U/L (ref 38–126)
Anion gap: 7 (ref 5–15)
BUN: 10 mg/dL (ref 8–23)
CO2: 24 mmol/L (ref 22–32)
Calcium: 8.2 mg/dL — ABNORMAL LOW (ref 8.9–10.3)
Chloride: 110 mmol/L (ref 98–111)
Creatinine, Ser: 0.65 mg/dL (ref 0.61–1.24)
GFR calc Af Amer: >60 mL/min (ref >60)
GFR calc non Af Amer: >60 mL/min (ref >60)
Glucose, Bld: 206 mg/dL — ABNORMAL HIGH (ref 70–99)
Potassium: 4.5 mmol/L (ref 3.5–5.1)
Sodium: 141 mmol/L (ref 135–145)
Total Bilirubin: 0.5 mg/dL (ref 0.3–1.2)
Total Protein: 5.3 g/dL — ABNORMAL LOW (ref 6.5–8.1)

## 2019-01-26 LAB — CBC WITH DIFFERENTIAL/PLATELET
Abs Immature Granulocytes: 0.08 10*3/uL — ABNORMAL HIGH (ref 0.00–0.07)
Basophils Absolute: 0 10*3/uL (ref 0.0–0.1)
Basophils Relative: 1 %
Eosinophils Absolute: 0.1 10*3/uL (ref 0.0–0.5)
Eosinophils Relative: 2 %
HCT: 38 % — ABNORMAL LOW (ref 39.0–52.0)
Hemoglobin: 11.7 g/dL — ABNORMAL LOW (ref 13.0–17.0)
Immature Granulocytes: 1 %
Lymphocytes Relative: 23 %
Lymphs Abs: 1.6 10*3/uL (ref 0.7–4.0)
MCH: 29.7 pg (ref 26.0–34.0)
MCHC: 30.8 g/dL (ref 30.0–36.0)
MCV: 96.4 fL (ref 80.0–100.0)
Monocytes Absolute: 0.6 10*3/uL (ref 0.1–1.0)
Monocytes Relative: 8 %
Neutro Abs: 4.7 10*3/uL (ref 1.7–7.7)
Neutrophils Relative %: 65 %
Platelets: 269 10*3/uL (ref 150–400)
RBC: 3.94 MIL/uL — ABNORMAL LOW (ref 4.22–5.81)
RDW: 14.7 % (ref 11.5–15.5)
WBC: 7.2 10*3/uL (ref 4.0–10.5)
nRBC: 0 % (ref 0.0–0.2)

## 2019-01-26 MED ORDER — PALONOSETRON HCL INJECTION 0.25 MG/5ML
INTRAVENOUS | Status: AC
  Filled 2019-01-26: qty 5

## 2019-01-26 MED ORDER — SODIUM CHLORIDE 0.9 % IV SOLN
Freq: Once | INTRAVENOUS | Status: AC
  Administered 2019-01-26: 10:00:00 via INTRAVENOUS

## 2019-01-26 MED ORDER — DIPHENOXYLATE-ATROPINE 2.5-0.025 MG PO TABS: 2.0000 | ORAL_TABLET | Freq: Three times a day (TID) | ORAL | 3 refills | Status: DC | PRN

## 2019-01-26 MED ORDER — FLUOROURACIL CHEMO INJECTION 2.5 GM/50ML
320.0000 mg/m2 | Freq: Once | INTRAVENOUS | Status: AC
  Administered 2019-01-26: 700 mg via INTRAVENOUS
  Filled 2019-01-26: qty 14

## 2019-01-26 MED ORDER — SODIUM CHLORIDE 0.9 % IV SOLN
327.0000 mg/m2 | Freq: Once | INTRAVENOUS | Status: AC
  Administered 2019-01-26: 700 mg via INTRAVENOUS
  Filled 2019-01-26: qty 35

## 2019-01-26 MED ORDER — SODIUM CHLORIDE 0.9 % IV SOLN
1920.0000 mg/m2 | INTRAVENOUS | Status: DC
  Administered 2019-01-26: 4100 mg via INTRAVENOUS
  Filled 2019-01-26: qty 82

## 2019-01-26 MED ORDER — SODIUM CHLORIDE 0.9 % IV SOLN
5.0000 mg/kg | Freq: Once | INTRAVENOUS | Status: AC
  Administered 2019-01-26: 11:00:00 400 mg via INTRAVENOUS
  Filled 2019-01-26: qty 16

## 2019-01-26 MED ORDER — SODIUM CHLORIDE 0.9 % IV SOLN
90.0000 mg/m2 | Freq: Once | INTRAVENOUS | Status: AC
  Administered 2019-01-26: 200 mg via INTRAVENOUS
  Filled 2019-01-26: qty 10

## 2019-01-26 MED ORDER — SODIUM CHLORIDE 0.9 % IV SOLN
Freq: Once | INTRAVENOUS | Status: AC
  Administered 2019-01-26: 10:00:00 via INTRAVENOUS
  Filled 2019-01-26: qty 5

## 2019-01-26 MED ORDER — PALONOSETRON HCL INJECTION 0.25 MG/5ML
0.2500 mg | Freq: Once | INTRAVENOUS | Status: AC
  Administered 2019-01-26: 0.25 mg via INTRAVENOUS

## 2019-01-26 MED ORDER — ATROPINE SULFATE 1 MG/ML IJ SOLN
0.5000 mg | Freq: Once | INTRAMUSCULAR | Status: AC | PRN
  Administered 2019-01-26: 0.5 mg via INTRAVENOUS
  Filled 2019-01-26: qty 1

## 2019-01-26 NOTE — Progress Notes (Signed)
Remy McKenzie, [place] [address]   CLINIC:  Medical Oncology/Hematology  PCP:  [name], MD [place] [address] [phone]   REASON FOR VISIT:  Follow-up for Stage IV Colon Cancer  CURRENT THERAPY: FOLFIRI  BRIEF ONCOLOGIC HISTORY:  Oncology History  Rectal cancer ([place])  07/24/2010 Initial Diagnosis   Invasive adenocarcinoma of rectum   09/09/2010 Concurrent Chemotherapy   S/P radiation and concurrent 5-FU continuous infusion from 09/09/10- 10/10/10.   11/14/2010 Surgery   S/P proctectomy with colorectal anastomosis and diverting loop ileostomy on 11/14/10 at [facility] by Dr. Harlon Ditty. Pathology reveals a pT3b N1 with 3/20 lymph nodes.   02/05/2011 - 07/14/2011 Chemotherapy   FOLFOX   08/18/2011 Surgery   Approximate date of surgery- [place] by Dr. Harlon Ditty    Remission     11/24/2013 Survivorship   Colonoscopy- somewhat fibrotic/friable anastomotic mucosal-status post biopsy (narrowing not felt to be clinically significant).  Negative pathology for malignancy   09/12/2014 Imaging   DVT in the left femoral venous system, left common iliac vein, IVC, and within the IVC filter Right lower extremity venography confirms chronic occlusion of the femoral venous system with collateralization. The right iliac venous system is patent and do   09/25/2014 PET scan   The right middle lobe pulmonary nodule is hypermetabolic, favored to represent a primary bronchogenic carcinoma.Equivocal mediastinal nodes, similar to surrounding blood pool. Bilateral adrenal hypermetabolism, felt to be physiologic   10/24/2014 Pathology Results   Lung, needle/core biopsy(ies), RML - ADENOCARCINOMA, SEE COMMENT metastatic adenocarcinoma of a colorectal primary   10/24/2014 Relapse/Recurrence     11/16/2014 Definitive Surgery   Bronchoscopy, right video-assisted thoracoscopy, wedge resection of right middle lobe by Dr. Servando Snare   11/16/2014  Pathology Results   Lung, wedge biopsy/resection, right lingula and small portion of middle lobe - METASTATIC ADENOCARCINOMA, CONSISTENT WITH COLORECTAL PRIMARY, SPANNING 2.0 CM. - THE SURGICAL RESECTION MARGINS ARE NEGATIVE FOR ADENOCARCINOMA.   11/16/2014 Remission   THE SURGICAL RESECTION MARGINS ARE NEGATIVE FOR ADENOCARCINOMA.   03/07/2015 Imaging   CT CAP- Interval resection of right middle lobe metastasis. No acute process or evidence of metastatic disease in the chest, abdomen or pelvis. Improved right upper lobe reticular nodular opacities are favored to represent resolving infection.   09/14/2015 Imaging   CT CAP- No findings of recurrent malignancy. No recurrence along the wedge resection site of the right middle lobe. Right anterior abdominal wall focal hernia containing a knuckle of small bowel without complicating feature.   06/13/2016 Imaging   CT CAP- 1. New right-sided pleural metastasis/mass. Other smaller right-sided pulmonary nodules which are all pleural-based and most likely represent pleural metastasis. 2. No convincing evidence of abdominopelvic nodal metastasis. 3. Constellation of findings, including pancreatic atrophy, duct dilatation, and pancreatic head soft tissue fullness which are highly suspicious for pancreatic adenocarcinoma. Metastatic disease felt much less likely. Consider endoscopic ultrasound sampling or ERCP. Cannot exclude superimposed acute pancreatitis. 4. New enlargement of the appendiceal tip with subtle surrounding edema. Cannot exclude early or mild appendicitis. 5. Coronary artery atherosclerosis. Aortic atherosclerosis. 6. 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   06/13/2016 Progression   CT scan demonstrates progression of disease   06/19/2016 Procedure   EUS with FNA by Dr. Ardis Hughs   06/20/2016 Pathology Results   FINE NEEDLE ASPIRATION, ENDOSCOPIC, PANCREAS UNCINATE AREA(SPECIMEN 1 OF 1 COLLECTED  06/19/16): MALIGNANT CELLS CONSISTENT WITH METASTATIC ADENOCARCINOMA.  06/25/2016 PET scan   1. Two pleural-based nodules in the right hemithorax are hypermetabolic. Metastatic disease is a distinct consideration. The scattered pulmonary parenchymal nodule seen on previous diagnostic CT imaging are below the threshold for reliable resolution on PET imaging. 2. Hypermetabolic lesion pancreatic head, consistent with neoplasm. As noted on prior CT, adenocarcinoma is any consideration. No evidence for hypermetabolic abdominal lymphadenopathy. Mottled uptake noted in the liver, but no discrete hepatic metastases are evident on PET imaging. 3. Appendix remains distended up to 0.9-10 mm diameter with a stone towards the tip in subtle periappendiceal edema/inflammation. The appendix is hypermetabolic along its length. Imaging features are relatively stable in the 12 day interval since prior CT scan. While appendicitis is a consideration, the relative stability over 12 days would be unusual for that etiology. Continued close follow-up recommended.   06/26/2016 Pathology Results   Not enough tissue for foundationONE or K-ras testing.   07/07/2016 Procedure   Port placed by Dr. Arnoldo Morale   07/08/2016 -  Chemotherapy   The patient had pegfilgrastim (NEULASTA) injection 6 mg, 6 mg, Subcutaneous,  Once, 0 of 4 cycles  ondansetron (ZOFRAN) IVPB 8 mg, 8 mg, Intravenous,  Once, 0 of 4 cycles  leucovorin 804 mg in dextrose 5 % 250 mL infusion, 400 mg/m2, Intravenous,  Once, 0 of 4 cycles  oxaliplatin (ELOXATIN) 170 mg in dextrose 5 % 500 mL chemo infusion, 85 mg/m2, Intravenous,  Once, 0 of 4 cycles  fluorouracil (ADRUCIL) 4,800 mg in sodium chloride 0.9 % 150 mL chemo infusion, 2,400 mg/m2 = 4,800 mg, Intravenous, 1 Day/Dose, 0 of 4 cycles  fluorouracil (ADRUCIL) chemo injection 800 mg, 400 mg/m2, Intravenous,  Once, 0 of 4 cycles  pegfilgrastim (NEULASTA) injection 6 mg, 6 mg, Subcutaneous,  Once, 2  of 2 cycles  ondansetron (ZOFRAN) IVPB 8 mg, 8 mg, Intravenous,  Once, 12 of 12 cycles  leucovorin 660 mg in dextrose 5 % 250 mL infusion, 660 mg (original dose ), Intravenous,  Once, 12 of 12 cycles Dose modification: 660 mg (Cycle 1)  oxaliplatin (ELOXATIN) 140 mg in dextrose 5 % 500 mL chemo infusion, 140 mg (original dose ), Intravenous,  Once, 12 of 12 cycles Dose modification: 140 mg (Cycle 1), 119 mg (85 % of original dose 140 mg, Cycle 8, Reason: Provider Judgment)  fluorouracil (ADRUCIL) 3,850 mg in sodium chloride 0.9 % 150 mL chemo infusion, 3,850 mg (original dose ), Intravenous, 1 Day/Dose, 12 of 12 cycles Dose modification: 3,850 mg (Cycle 1)  fluorouracil (ADRUCIL) chemo injection 700 mg, 700 mg (original dose ), Intravenous,  Once, 12 of 12 cycles Dose modification: 700 mg (Cycle 1)  palonosetron (ALOXI) injection 0.25 mg, 0.25 mg, Intravenous,  Once, 7 of 12 cycles Administration: 0.25 mg (09/30/2016)  bevacizumab (AVASTIN) 475 mg in sodium chloride 0.9 % 100 mL chemo infusion, 5 mg/kg = 475 mg, Intravenous,  Once, 6 of 11 cycles Administration: 500 mg (09/30/2016)  leucovorin 880 mg in dextrose 5 % 250 mL infusion, 400 mg/m2 = 880 mg, Intravenous,  Once, 7 of 12 cycles Administration: 880 mg (09/30/2016)  oxaliplatin (ELOXATIN) 185 mg in dextrose 5 % 500 mL chemo infusion, 85 mg/m2 = 185 mg, Intravenous,  Once, 7 of 12 cycles Administration: 185 mg (09/30/2016)  fluorouracil (ADRUCIL) chemo injection 900 mg, 400 mg/m2 = 900 mg, Intravenous,  Once, 7 of 12 cycles Administration: 900 mg (09/30/2016)  fluorouracil (ADRUCIL) 5,300 mg in sodium chloride 0.9 % 144 mL chemo infusion, 2,400 mg/m2 = 5,300  mg, Intravenous, 1 Day/Dose, 7 of 12 cycles Administration: 5,300 mg (09/30/2016)  for chemotherapy treatment.     08/27/2016 Imaging   CT CAP- 1. Pulmonary metastatic disease is stable. 2. Pancreatic head mass, grossly stable, with progressive atrophy of the body and tail  of the pancreas. Stable peripancreatic lymph node. 3. Mild circumferential rectal wall thickening, stable. 4. Aortic atherosclerosis (ICD10-170.0). Coronary artery calcification. 5. Hyperattenuating lesion off the lower pole left kidney, stable, too small to characterize. Continued attention on followup exams is warranted. 6. Persistent wall thickening and mild dilatation of the proximal appendix, of uncertain etiology. Continued attention on followup exams is warranted.   11/06/2016 Imaging   CT C/A/P: IMPRESSION: 1. Stable pulmonary metastatic disease. 2. Similar appearing pancreatic head mass. Progressed atrophy of the pancreatic body and tail. Stable peripancreatic lymph node. 3. Stable hypoattenuating lesion partially exophytic off the inferior pole of the left kidney. 4. Aortic atherosclerosis. 5. Persistent mild wall thickening and mild dilatation of the appendix.    01/30/2017 Imaging   Ct C/A/P: IMPRESSION: 1. Stable pulmonary metastatic disease. 2. Stable to slight increase in size of pancreatic head mass. 3.  Aortic Atherosclerosis (ICD10-I70.0). 4. Similar appearance of mild wall thickening of the appendix which contains an appendicolith.   05/01/2017 Imaging   CT C/A/P: Stable disease in the lungs, no progression of disease in the abdomen.   10/04/2018 -  Chemotherapy   The patient had palonosetron (ALOXI) injection 0.25 mg, 0.25 mg, Intravenous,  Once, 7 of 9 cycles Administration: 0.25 mg (10/04/2018), 0.25 mg (10/25/2018), 0.25 mg (12/01/2018), 0.25 mg (12/15/2018), 0.25 mg (12/29/2018), 0.25 mg (01/12/2019), 0.25 mg (01/26/2019) pegfilgrastim (NEULASTA) injection 6 mg, 6 mg, Subcutaneous, Once, 2 of 2 cycles Administration: 6 mg (10/27/2018) pegfilgrastim-cbqv (UDENYCA) injection 6 mg, 6 mg, Subcutaneous, Once, 5 of 7 cycles Administration: 6 mg (12/03/2018), 6 mg (12/17/2018), 6 mg (12/31/2018), 6 mg (01/14/2019) bevacizumab (AVASTIN) 450 mg in sodium chloride 0.9 % 100 mL  chemo infusion, 5 mg/kg = 450 mg, Intravenous,  Once, 4 of 4 cycles Administration: 400 mg (12/01/2018), 400 mg (12/15/2018), 400 mg (12/29/2018) irinotecan (CAMPTOSAR) 380 mg in sodium chloride 0.9 % 500 mL chemo infusion, 180 mg/m2 = 380 mg, Intravenous,  Once, 7 of 9 cycles Dose modification: 144 mg/m2 (original dose 180 mg/m2, Cycle 2, Reason: Provider Judgment, Comment: 20% dose reduction), 90 mg/m2 (50 % of original dose 180 mg/m2, Cycle 3, Reason: Other (see comments), Comment: [id number]) Administration: 380 mg (10/04/2018), 300 mg (10/25/2018), 200 mg (12/01/2018), 200 mg (12/15/2018), 200 mg (12/29/2018), 200 mg (01/12/2019), 200 mg (01/26/2019) leucovorin 900 mg in sodium chloride 0.9 % 250 mL infusion, 421 mg/m2 = 856 mg, Intravenous,  Once, 7 of 9 cycles Dose modification: 320 mg/m2 (original dose 400 mg/m2, Cycle 2, Reason: Provider Judgment, Comment: 20% dose reduction) Administration: 900 mg (10/04/2018), 700 mg (10/25/2018), 700 mg (12/01/2018), 700 mg (12/15/2018), 700 mg (12/29/2018), 700 mg (01/12/2019), 700 mg (01/26/2019) fluorouracil (ADRUCIL) chemo injection 850 mg, 400 mg/m2 = 850 mg, Intravenous,  Once, 7 of 9 cycles Dose modification: 320 mg/m2 (original dose 400 mg/m2, Cycle 2, Reason: Provider Judgment, Comment: 20% dose reduction) Administration: 850 mg (10/04/2018), 700 mg (10/25/2018), 700 mg (12/01/2018), 700 mg (12/15/2018), 700 mg (12/29/2018), 700 mg (01/12/2019), 700 mg (01/26/2019) fosaprepitant (EMEND) 150 mg, dexamethasone (DECADRON) 12 mg in sodium chloride 0.9 % 145 mL IVPB, , Intravenous,  Once, 5 of 7 cycles Administration:  (12/01/2018),  (12/15/2018),  (12/29/2018),  (01/12/2019),  (01/26/2019) fluorouracil (ADRUCIL) 5,000 mg  in sodium chloride 0.9 % 150 mL chemo infusion, 2,325 mg/m2 = 5,150 mg, Intravenous, 1 Day/Dose, 7 of 9 cycles Dose modification: 1,920 mg/m2 (original dose 2,400 mg/m2, Cycle 2, Reason: Provider Judgment, Comment: 20% dose reduction) Administration: 5,000 mg (10/04/2018),  4,100 mg (10/25/2018), 4,100 mg (12/01/2018), 4,100 mg (12/15/2018), 4,100 mg (12/29/2018), 4,100 mg (01/12/2019), 4,100 mg (01/26/2019) bevacizumab-bvzr (ZIRABEV) 400 mg in sodium chloride 0.9 % 100 mL chemo infusion, 5 mg/kg = 400 mg (100 % of original dose 5 mg/kg), Intravenous,  Once, 2 of 4 cycles Dose modification: 5 mg/kg (original dose 5 mg/kg, Cycle 6) Administration: 400 mg (01/12/2019), 400 mg (01/26/2019)  for chemotherapy treatment.       CANCER STAGING: Cancer Staging Rectal cancer ([place]) Staging form: Colon and Rectum, AJCC 7th Edition - Clinical: Stage IIIB (T3, N1, M0) - Signed by [name], MD on 02/04/2011 - Pathologic stage from 11/17/2014: Stage IVA (M1a) - Signed by Baird Cancer, PA-C on 09/14/2015    INTERVAL HISTORY:  Mr. Belger 68 y.o. male seen for follow-up and next cycle of chemotherapy.  After last cycle, he had diarrhea for the first week.  He had to empty the bag for 5 times per day.  For the last 1 week he has come down to his baseline of emptying it 3 times per day.  Numbness in the feet has been stable.  Appetite and energy levels are 75%.  Denies any nausea or vomiting.  He gained about 5 to 6 pounds.  REVIEW OF SYSTEMS:  Review of Systems  Gastrointestinal: Positive for diarrhea.  Neurological: Positive for dizziness and numbness.  All other systems reviewed and are negative.    PAST MEDICAL/SURGICAL HISTORY:  Past Medical History:  Diagnosis Date   Chronic anticoagulation    Colon cancer ([place]) 07/24/2010   rectal ca, inv adenocarcinoma   Depression 04/01/2011   Diabetes mellitus without complication ([place])    DVT (deep venous thrombosis) ([place]) 05/09/2011   GERD (gastroesophageal reflux disease)    High output ileostomy ([place]) 05/21/2011   History of kidney stones    HTN (hypertension)    Hx of radiation therapy 09/02/10 to 10/14/10   pelvis   Lung metastasis ([facility])    Neuropathy    Peripheral vascular disease ([place])     dvt's,pe   Pneumonia    hx x3   Pulmonary embolism ([place])    Rectal cancer ([place]) 01/15/2011   S/P radiation and concurrent 5-FU continuous infusion from 09/09/10- 10/10/10.  S/P proctectomy with colorectal anastomosis and diverting loop ileostomy on 11/14/10 at [facility] by Dr. Harlon Ditty. Pathology reveals a pT3b N1 with 3/20 lymph nodes.     Past Surgical History:  Procedure Laterality Date   BILIARY STENT PLACEMENT N/A 09/16/2018   Procedure: STENT PLACEMENT;  Surgeon: Rogene Houston, MD;  [facility];  Service: Endoscopy;  Laterality: N/A;   BIOPSY  09/24/2018   Procedure: BIOPSY;  Surgeon: Danie Binder, MD;  [facility];  Service: Endoscopy;;   COLON SURGERY  11/14/2010   proctectomy with colorectal anastomosis and diverting loop ileostomy (temporary planned)   COLONOSCOPY  07/2010   proximal rectal apple core mass 10-14cm from anal verge (adenocarcinoma), 2-3cm distal rectal carpet polyp s/p piecemeal snare polypectomy (adenoma)   COLONOSCOPY  04/21/2012   RMR: Friable,fibrotic appearing colorectal anastomosis producing some luminal narrowing-not felt to be critical. path: focal erosion with slight inflammation and hyperemia. SURVEILLANCE DUE DEC 2015   COLONOSCOPY N/A  11/24/2013   Dr. Rourk:somewhat fibrotic/friable anastomotic mucosal-status post biopsy (narrowing not felt to be clinically significant) Single colonic diverticulum. benign polypoid rectal mucosa   colostomy reversal  april 2013   ERCP N/A 09/16/2018   Procedure: ENDOSCOPIC RETROGRADE CHOLANGIOPANCREATOGRAPHY (ERCP);  Surgeon: Rogene Houston, MD;  [facility];  Service: Endoscopy;  Laterality: N/A;   ESOPHAGOGASTRODUODENOSCOPY  07/2010   RMR: schatki ring s/p dilation, small hh, SB bx benign   ESOPHAGOGASTRODUODENOSCOPY N/A 09/04/2015   Procedure: ESOPHAGOGASTRODUODENOSCOPY (EGD);  Surgeon: Daneil Dolin, MD;  [facility];  Service: Endoscopy;  Laterality: N/A;   730   EUS  08/2010   Dr. Owens Loffler. uT3N0 circumferential, nearly obstruction rectosigmoid adenocarcinoma, distal edge 12cm from anal verge   EUS N/A 06/19/2016   Procedure: UPPER ENDOSCOPIC ULTRASOUND (EUS) RADIAL;  Surgeon: Milus Banister, MD;  [facility];  Service: Endoscopy;  Laterality: N/A;   FLEXIBLE SIGMOIDOSCOPY N/A 09/24/2018   Procedure: FLEXIBLE SIGMOIDOSCOPY;  Surgeon: Danie Binder, MD;  [facility];  Service: Endoscopy;  Laterality: N/A;   HERNIA REPAIR     abd hernia repair   IVC filter     ivc filter     port a cath placement     PORT-A-CATH REMOVAL  09/24/2011   Procedure: REMOVAL PORT-A-CATH;  Surgeon: Donato Heinz, MD;  Location: [facility];  Service: General;  Laterality: N/A;  Minor Room   PORTACATH PLACEMENT Right 07/07/2016   Procedure: INSERTION PORT-A-CATH;  Surgeon: Aviva Signs, MD;  Location: [facility];  Service: General;  Laterality: Right;   SPHINCTEROTOMY N/A 09/16/2018   Procedure: SPHINCTEROTOMY with balloon dialation;  Surgeon: Rogene Houston, MD;  [facility];  Service: Endoscopy;  Laterality: N/A;   TRANSVERSE LOOP COLOSTOMY N/A 09/29/2018   Procedure: TRANSVERSE LOOP COLOSTOMY;  Surgeon: Aviva Signs, MD;  Location: [facility];  Service: General;  Laterality: N/A;   VIDEO ASSISTED THORACOSCOPY (VATS)/WEDGE RESECTION Right 11/15/2014   Procedure: VIDEO ASSISTED THORACOSCOPY (VATS)/LUNG RESECTION WITH RIGHT LINGULECTOMY;  Surgeon: [name], MD;  Location: [place];  Service: Thoracic;  Laterality: Right;   VIDEO BRONCHOSCOPY N/A 11/15/2014   Procedure: VIDEO BRONCHOSCOPY;  Surgeon: [name], MD;  Location: [facility] OR;  Service: Thoracic;  Laterality: N/A;     SOCIAL HISTORY:  Social History   Socioeconomic History   Marital status: Married    Spouse name: Not on file   Number of children: 2   Years of education: Not on file   Highest education level: Not on file  Occupational History    Occupation: self-employed Regulatory affairs officer, currently not working    Fish farm manager: [place] resource strain: Not very hard   Food insecurity    Worry: Never true    Inability: Never true   Transportation needs    Medical: No    Non-medical: No  Tobacco Use   Smoking status: Never Smoker   Smokeless tobacco: Never Used  Substance and Sexual Activity   Alcohol use: No   Drug use: No   Sexual activity: Yes    Birth control/protection: None    Comment: married  Lifestyle   Physical activity    Days per week: 0 days    Minutes per session: 0 min   Stress: Only a little  Relationships   Social connections    Talks on phone: More than three times a week    Gets together: Once a  week    Attends religious service: Patient refused    Active member of club or organization: Patient refused    Attends meetings of clubs or organizations: Patient refused    Relationship status: Patient refused   Intimate partner violence    Fear of current or ex partner: Patient refused    Emotionally abused: Patient refused    Physically abused: Patient refused    Forced sexual activity: Patient refused  Other Topics Concern   Not on file  Social History Narrative   Not on file    FAMILY HISTORY:  Family History  Problem Relation Age of Onset   Cancer Brother        throat   Cancer Brother        prostate   Colon cancer Neg Hx    Liver disease Neg Hx    Inflammatory bowel disease Neg Hx     CURRENT MEDICATIONS:  Outpatient Encounter Medications as of 01/26/2019  Medication Sig   clonazePAM (KLONOPIN) 1 MG tablet Take 1 tablet by mouth three times daily as needed   Diphenhyd-Hydrocort-Nystatin (FIRST-DUKES MOUTHWASH) SUSP Use as directed 5 mLs in the mouth or throat 4 (four) times daily.   diphenoxylate-atropine (LOMOTIL) 2.5-0.025 MG tablet Take 2 tablets by mouth 3 (three) times daily as needed for diarrhea or loose stools.    dronabinol (MARINOL) 5 MG capsule Take 1 capsule (5 mg total) by mouth 2 (two) times daily before a meal.   gabapentin (NEURONTIN) 300 MG capsule Take 3 capsules (900 mg total) by mouth 2 (two) times daily.   glimepiride (AMARYL) 2 MG tablet Take 2 mg by mouth 2 (two) times daily before a meal.    hydrocortisone (CORTEF) 20 MG tablet Take 20 mg by mouth daily.    megestrol (MEGACE) 400 MG/10ML suspension Take 10 mLs (400 mg total) by mouth 2 (two) times daily.   mirtazapine (REMERON) 7.5 MG tablet Take 7.5 mg by mouth at bedtime.    omeprazole (PRILOSEC) 20 MG capsule Take 20 mg by mouth 2 (two) times daily before a meal.    pantoprazole (PROTONIX) 40 MG tablet Take 40 mg by mouth daily.    Probiotic Product (PROBIOTIC DAILY PO) Take 1 capsule by mouth daily.   rivaroxaban (XARELTO) 20 MG TABS tablet Take 1 tablet (20 mg total) by mouth daily with supper.   traZODone (DESYREL) 100 MG tablet Take 1 tablet (100 mg total) by mouth at bedtime.   vitamin B-12 (CYANOCOBALAMIN) 1000 MCG tablet Take 1,000 mcg by mouth daily.   [DISCONTINUED] diphenoxylate-atropine (LOMOTIL) 2.5-0.025 MG tablet Take 1 tablet by mouth 4 (four) times daily as needed for diarrhea or loose stools.   acetaminophen (TYLENOL) 500 MG tablet Take 1,000 mg by mouth every 6 (six) hours as needed for mild pain or moderate pain.    ondansetron (ZOFRAN) 8 MG tablet Take 1 tablet (8 mg total) by mouth 2 (two) times daily as needed for refractory nausea / vomiting. Start on day 3 after chemotherapy. (Patient not taking: Reported on 01/26/2019)   oxyCODONE-acetaminophen (PERCOCET) 10-325 MG tablet Take 1 tablet by mouth every 6 (six) hours as needed for pain. (Patient not taking: Reported on 01/26/2019)   prochlorperazine (COMPAZINE) 10 MG tablet Take 1 tablet (10 mg total) by mouth every 6 (six) hours as needed for nausea or vomiting. (Patient not taking: Reported on 01/26/2019)   promethazine (PHENERGAN) 25 MG tablet Take 1  tablet (25 mg total) by mouth every 8 (eight) hours  as needed for refractory nausea / vomiting. (Patient not taking: Reported on 01/26/2019)   [DISCONTINUED] levofloxacin (LEVAQUIN) 500 MG tablet Take 1 tablet (500 mg total) by mouth daily.   Facility-Administered Encounter Medications as of 01/26/2019  Medication   sodium chloride 0.9 % injection 10 mL   sodium chloride flush (NS) 0.9 % injection 10 mL    ALLERGIES:  Allergies  Allergen Reactions   Oxycodone     Blisters, hallucinations   Tramadol     Blisters, hallucinations   Trazodone And Nefazodone Other (See Comments)    hallucinations     PHYSICAL EXAM:  ECOG Performance status: 1  Vitals:   01/26/19 0814 01/26/19 0841  BP: 117/74 117/74  Pulse: 82 82  Resp: 18 18  Temp: (!) 97.1 F (36.2 C) (!) 97.1 F (36.2 C)  SpO2: 100% 100%   Filed Weights   01/26/19 0814 01/26/19 0841  Weight: 174 lb (78.9 kg) 177 lb (80.3 kg)    Physical Exam Constitutional:      Appearance: Normal appearance.  HENT:     Head: Normocephalic.     Nose: Nose normal.     Mouth/Throat:     Mouth: Mucous membranes are moist.     Pharynx: Oropharynx is clear.  Eyes:     Extraocular Movements: Extraocular movements intact.     Conjunctiva/sclera: Conjunctivae normal.  Neck:     Musculoskeletal: Normal range of motion.  Cardiovascular:     Rate and Rhythm: Regular rhythm. Tachycardia present.     Pulses: Normal pulses.     Heart sounds: Normal heart sounds.  Pulmonary:     Effort: Pulmonary effort is normal.     Breath sounds: Normal breath sounds.  Abdominal:     General: Abdomen is flat. Bowel sounds are normal.     Palpations: Abdomen is soft.  Musculoskeletal: Normal range of motion.  Skin:    General: Skin is warm and dry.  Neurological:     General: No focal deficit present.     Mental Status: He is alert and oriented to person, place, and time.  Psychiatric:        Mood and Affect: Mood normal.        Behavior:  Behavior normal.        Thought Content: Thought content normal.        Judgment: Judgment normal.      LABORATORY DATA:  I have reviewed the labs as listed.  CBC    Component Value Date/Time   WBC 7.2 01/26/2019 0819   RBC 3.94 (L) 01/26/2019 0819   HGB 11.7 (L) 01/26/2019 0819   HGB 11.6 (L) 02/04/2011 1059   HCT 38.0 (L) 01/26/2019 0819   HCT 34.7 (L) 02/04/2011 1059   PLT 269 01/26/2019 0819   PLT 282 02/04/2011 1059   MCV 96.4 01/26/2019 0819   MCV 76.0 (L) 02/04/2011 1059   MCH 29.7 01/26/2019 0819   MCHC 30.8 01/26/2019 0819   RDW 14.7 01/26/2019 0819   RDW 18.5 (H) 02/04/2011 1059   LYMPHSABS 1.6 01/26/2019 0819   LYMPHSABS 1.1 02/04/2011 1059   MONOABS 0.6 01/26/2019 0819   MONOABS 0.5 02/04/2011 1059   EOSABS 0.1 01/26/2019 0819   EOSABS 0.1 02/04/2011 1059   BASOSABS 0.0 01/26/2019 0819   BASOSABS 0.1 02/04/2011 1059   CMP Latest Ref Rng & Units 01/26/2019 01/12/2019 12/29/2018  Glucose 70 - 99 mg/dL 206(H) 185(H) 325(H)  BUN 8 - 23 mg/dL 10 10 8  Creatinine 0.61 - 1.24 mg/dL 0.65 0.65 0.79  Sodium 135 - 145 mmol/L 141 136 136  Potassium 3.5 - 5.1 mmol/L 4.5 4.2 3.9  Chloride 98 - 111 mmol/L 110 105 105  CO2 22 - 32 mmol/L 24 24 21(L)  Calcium 8.9 - 10.3 mg/dL 8.2(L) 8.2(L) 8.1(L)  Total Protein 6.5 - 8.1 g/dL 5.3(L) 5.5(L) 5.5(L)  Total Bilirubin 0.3 - 1.2 mg/dL 0.5 0.4 0.5  Alkaline Phos 38 - 126 U/L 110 115 109  AST 15 - 41 U/L '15 17 22  ' ALT 0 - 44 U/L '16 17 19    ' I have personally reviewed the scans and discussed with the patient.   ASSESSMENT & PLAN:   Rectal cancer 1.  Stage IV rectal cancer with lung and pancreatic metastasis: Foundation 1 CDX shows KRAS G13D and NRAS wild type, MS-stable. -12 cycles of FOLFOX in the adjuvant setting from October 12 through 07/14/2011. -15 cycles of FOLFOX and bevacizumab from 07/08/2016 through 01/20/2017 followed by infusional 5-FU and bevacizumab until 07/14/2017. -Maintenance Xeloda from 08/07/2017 with  bevacizumab through 08/19/2018 with progression. - ERCP and stent placement on 09/16/2018 showing single localized biliary stricture.  Biliary sphincterotomy was performed with stricture dilated to 6 mm with balloon to facilitate stent placement.  1 covered metal stent was placed. -Flexible sigmoidoscopy on 09/24/2018 showing narrowing of the colorectal anastomosis.  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 - 2 cycles of FOLFIRI from 10/04/2018 through 10/25/2018 with 20% dose reduction during cycle 2. - He has developed severe tiredness after cycle 2 even after dose reduction. 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  - 6 cycles 3 through 6 with 50% dose reduction of irinotecan from 12/01/2018 through 01/12/2019. - He tolerated last cycle except that he had more diarrhea.  During the first week he had to empty the bag 5 times per day.  He is back to emptying it 3 times a day which is his baseline. - He is currently taking Lomotil 2 tablets twice daily and Imodium 1 tablet twice daily. - I have recommended him to take Lomotil 2 tablets 3 times a day. -He will proceed with next cycle today.  I plan to scan him after cycle 8.   2.  Weight loss: -He is taking Megace twice daily.  He is drinking Ensure 4 cans/day.  He is eating 3 meals a day. - He gained 6 pounds from last visit.  3.  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  4.  Anxiety: -He takes Klonopin 1 mg twice daily.   Total time spent is 25 minutes with more than 50% of the time spent face-to-face discussing treatment plan, counseling and coordination of care.  Orders placed this encounter:  No orders of the defined types were placed in this encounter.     Derek Jack, MD  [place] [phone]

## 2019-01-26 NOTE — Patient Instructions (Signed)
[facility] Discharge Instructions for Patients Receiving Chemotherapy  Today you received the following chemotherapy agents   To help prevent nausea and vomiting after your treatment, we encourage you to take your nausea medication   If you develop nausea and vomiting that is not controlled by your nausea medication, call the clinic.   BELOW ARE SYMPTOMS THAT SHOULD BE REPORTED IMMEDIATELY:  *FEVER GREATER THAN 100.5 F  *CHILLS WITH OR WITHOUT FEVER  NAUSEA AND VOMITING THAT IS NOT CONTROLLED WITH YOUR NAUSEA MEDICATION  *UNUSUAL SHORTNESS OF BREATH  *UNUSUAL BRUISING OR BLEEDING  TENDERNESS IN MOUTH AND THROAT WITH OR WITHOUT PRESENCE OF ULCERS  *URINARY PROBLEMS  *BOWEL PROBLEMS  UNUSUAL RASH Items with * indicate a potential emergency and should be followed up as soon as possible.  Feel free to call the clinic should you have any questions or concerns. The clinic phone number is [phone].  Please show the CHEMO ALERT CARD at check-in to the Emergency Department and triage nurse.   

## 2019-01-26 NOTE — Progress Notes (Signed)
Subjective:     Charles [name]  Patient presents with concerns about his transverse loop colostomy.  He did have some leakage around the stoma and the skin became irritated.  He was started on Diflucan and there is colostomy appliance was readjusted.  His wife states that sometimes the colostomy tissue retracts a little bit.  That can make it harder to prevent leakage around the colostomy.  A home health nurse is seeing the patient and advising them.  Patient was started an oral antifungal. Objective:    BP 122/79 (BP Location: Left Arm, Patient Position: Sitting, Cuff Size: Normal)   Pulse 83   Temp (!) 97.1 F (36.2 C) (Tympanic)   Resp 16   Ht 6\' 2"  (1.88 m)   Wt 168 lb (76.2 kg)   SpO2 97%   BMI 21.57 kg/m   General:  alert, cooperative and no distress  Abdomen is soft.  A left upper quadrant loop colostomy is present.  The mucosa is well healed circumferentially without evidence of leak into the peritoneal cavity.  Mild skin erythema is noted inferior and medially to the colostomy.  No open wounds are noted.  The appliance was reapplied by his wife.     Assessment:    Skin cellulitis, parastomal, resolving    Plan:   The patient's colostomy issues seem to be resolving.  I told him it was best to keep every stool off the surrounding skin.  There is no reason to readjust the colostomy.  I told him the to return to my office should any further problems arise.  Follow-up as needed.

## 2019-01-26 NOTE — Patient Instructions (Addendum)
[place] at [facility] Discharge Instructions  You were seen today by Dr. [name]. He went over your recent lab results. He will see you back in 2 weeks for labs and follow up.  Start taking lomotil 2 tablets 3 times a day for diarrhea.  Thank you for choosing [place] at [facility] to provide your oncology and hematology care.  To afford each patient quality time with our provider, please arrive at least 15 minutes before your scheduled appointment time.   If you have a lab appointment with the [place] please come in thru the  Main Entrance and check in at the main information desk  You need to re-schedule your appointment should you arrive 10 or more minutes late.  We strive to give you quality time with our providers, and arriving late affects you and other patients whose appointments are after yours.  Also, if you no show three or more times for appointments you may be dismissed from the clinic at the providers discretion.     Again, thank you for choosing [facility].  Our hope is that these requests will decrease the amount of time that you wait before being seen by our physicians.       _____________________________________________________________  Should you have questions after your visit to [facility], please contact our office at [phone] between the hours of 8:00 a.m. and 4:30 p.m.  Voicemails left after 4:00 p.m. will not be returned until the following business day.  For prescription refill requests, have your pharmacy contact our office and allow 72 hours.    Cancer Center Support Programs:   > Cancer Support Group  2nd Tuesday of the month 1pm-2pm, Journey Room

## 2019-01-26 NOTE — Assessment & Plan Note (Addendum)
1.  Stage IV rectal cancer with lung and pancreatic metastasis: Foundation 1 CDX shows KRAS G13D and NRAS wild type, MS-stable. -12 cycles of FOLFOX in the adjuvant setting from October 12 through 07/14/2011. -15 cycles of FOLFOX and bevacizumab from 07/08/2016 through 01/20/2017 followed by infusional 5-FU and bevacizumab until 07/14/2017. -Maintenance Xeloda from 08/07/2017 with bevacizumab through 08/19/2018 with progression. - ERCP and stent placement on 09/16/2018 showing single localized biliary stricture.  Biliary sphincterotomy was performed with stricture dilated to 6 mm with balloon to facilitate stent placement.  1 covered metal stent was placed. -Flexible sigmoidoscopy on 09/24/2018 showing narrowing of the colorectal anastomosis.  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 - 2 cycles of FOLFIRI from 10/04/2018 through 10/25/2018 with 20% dose reduction during cycle 2. - He has developed severe tiredness after cycle 2 even after dose reduction. 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  - 6 cycles 3 through 6 with 50% dose reduction of irinotecan from 12/01/2018 through 01/12/2019. - He tolerated last cycle except that he had more diarrhea.  During the first week he had to empty the bag 5 times per day.  He is back to emptying it 3 times a day which is his baseline. - He is currently taking Lomotil 2 tablets twice daily and Imodium 1 tablet twice daily. - I have recommended him to take Lomotil 2 tablets 3 times a day. -He will proceed with next cycle today.  I plan to scan him after cycle 8.   2.  Weight loss: -He is taking Megace twice daily.  He is drinking Ensure 4 cans/day.  He is eating 3 meals a day. - He gained 6 pounds from last visit.  3.  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  4.  Anxiety: -He takes Klonopin 1 mg twice daily.

## 2019-01-26 NOTE — Progress Notes (Signed)
Pt presents today for treatment and f/u visit with Dr. [name]. VS within parameters for treatment. No complaints of any pain today. Pt has no complaints of any significant changes since the last visit.   Message received from [facility] LPN. Proceed with treatment. Labs within parameters for tx.   Treatment given today per MD orders. Tolerated infusion without adverse affects. Vital signs stable. No complaints at this time. 5FU pump infusing per protocol. RUN noted on screen. Discharged from clinic ambulatory. F/U with [facility] as scheduled.

## 2019-01-27 ENCOUNTER — Other Ambulatory Visit (HOSPITAL_COMMUNITY): Payer: Self-pay | Admitting: Hematology

## 2019-01-27 DIAGNOSIS — E1151 Type 2 diabetes mellitus with diabetic peripheral angiopathy without gangrene: Secondary | ICD-10-CM | POA: Diagnosis not present

## 2019-01-27 DIAGNOSIS — T451X5A Adverse effect of antineoplastic and immunosuppressive drugs, initial encounter: Secondary | ICD-10-CM

## 2019-01-27 DIAGNOSIS — Z432 Encounter for attention to ileostomy: Secondary | ICD-10-CM | POA: Diagnosis not present

## 2019-01-27 DIAGNOSIS — C7800 Secondary malignant neoplasm of unspecified lung: Secondary | ICD-10-CM | POA: Diagnosis not present

## 2019-01-27 DIAGNOSIS — C20 Malignant neoplasm of rectum: Secondary | ICD-10-CM | POA: Diagnosis not present

## 2019-01-27 DIAGNOSIS — G620 Drug-induced polyneuropathy: Secondary | ICD-10-CM

## 2019-01-27 DIAGNOSIS — C7889 Secondary malignant neoplasm of other digestive organs: Secondary | ICD-10-CM | POA: Diagnosis not present

## 2019-01-27 DIAGNOSIS — K9189 Other postprocedural complications and disorders of digestive system: Secondary | ICD-10-CM | POA: Diagnosis not present

## 2019-01-27 LAB — CEA: CEA: 4.6 ng/mL (ref 0.0–4.7)

## 2019-01-28 ENCOUNTER — Inpatient Hospital Stay (HOSPITAL_COMMUNITY): Payer: Medicare Other

## 2019-01-28 ENCOUNTER — Encounter (HOSPITAL_COMMUNITY): Payer: Self-pay

## 2019-01-28 ENCOUNTER — Other Ambulatory Visit: Payer: Self-pay

## 2019-01-28 VITALS — BP 130/68 | HR 71 | Temp 96.8°F | Resp 18

## 2019-01-28 DIAGNOSIS — C20 Malignant neoplasm of rectum: Secondary | ICD-10-CM | POA: Diagnosis not present

## 2019-01-28 DIAGNOSIS — R197 Diarrhea, unspecified: Secondary | ICD-10-CM | POA: Diagnosis not present

## 2019-01-28 DIAGNOSIS — Z5111 Encounter for antineoplastic chemotherapy: Secondary | ICD-10-CM | POA: Diagnosis not present

## 2019-01-28 DIAGNOSIS — Z5189 Encounter for other specified aftercare: Secondary | ICD-10-CM | POA: Diagnosis not present

## 2019-01-28 DIAGNOSIS — C7800 Secondary malignant neoplasm of unspecified lung: Secondary | ICD-10-CM | POA: Diagnosis not present

## 2019-01-28 DIAGNOSIS — Z5112 Encounter for antineoplastic immunotherapy: Secondary | ICD-10-CM | POA: Diagnosis not present

## 2019-01-28 MED ORDER — SODIUM CHLORIDE 0.9% FLUSH
10.0000 mL | INTRAVENOUS | Status: DC | PRN
  Administered 2019-01-28: 10 mL
  Filled 2019-01-28: qty 10

## 2019-01-28 MED ORDER — PEGFILGRASTIM-CBQV 6 MG/0.6ML ~~LOC~~ SOSY
6.0000 mg | PREFILLED_SYRINGE | Freq: Once | SUBCUTANEOUS | Status: AC
  Administered 2019-01-28: 14:00:00 6 mg via SUBCUTANEOUS

## 2019-01-28 MED ORDER — HEPARIN SOD (PORK) LOCK FLUSH 100 UNIT/ML IV SOLN
500.0000 [IU] | Freq: Once | INTRAVENOUS | Status: AC | PRN
  Administered 2019-01-28: 500 [IU]

## 2019-01-28 NOTE — Patient Instructions (Signed)
[place] at [facility] Discharge Instructions  5FU pump discontinued with portacath flushed and Udenyca injection given as well. Follow-up as scheduled. Call clinic for any questions or concerns   Thank you for choosing [place] at [facility] to provide your oncology and hematology care.  To afford each patient quality time with our provider, please arrive at least 15 minutes before your scheduled appointment time.   If you have a lab appointment with the [place] please come in thru the Main Entrance and check in at the main information desk.  You need to re-schedule your appointment should you arrive 10 or more minutes late.  We strive to give you quality time with our providers, and arriving late affects you and other patients whose appointments are after yours.  Also, if you no show three or more times for appointments you may be dismissed from the clinic at the providers discretion.     Again, thank you for choosing [facility].  Our hope is that these requests will decrease the amount of time that you wait before being seen by our physicians.       _____________________________________________________________  Should you have questions after your visit to [facility], please contact our office at [phone] between the hours of 8:00 a.m. and 4:30 p.m.  Voicemails left after 4:00 p.m. will not be returned until the following business day.  For prescription refill requests, have your pharmacy contact our office and allow 72 hours.    Due to Covid, you will need to wear a mask upon entering the hospital. If you do not have a mask, a mask will be given to you at the Main Entrance upon arrival. For doctor visits, patients may have 1 support person with them. For treatment visits, patients can not have anyone with them due to social distancing guidelines and our immunocompromised population.

## 2019-01-28 NOTE — Progress Notes (Signed)
Charles [name] tolerated 5FU pump and Udenyca injection well without complaints or incident. 5FU pump discontinued with portacath flushed per protocol then de-accessed. VSS Pt discharged self ambulatory in satisfactory condition

## 2019-01-28 NOTE — Progress Notes (Signed)
Nutrition Follow-up:  Patient with stage IV rectal cancer with lung and pancreatic metastatic disease.  Ileostomy done on 09/29/2018.  Patient receiving  Chemotherapy at this time.    Spoke with patient during pump removal.  Reports that appetite is good and has been drinking either ensure plus or glucerna shakes.     Medications: reviewed  Labs: glucose 206  Anthropometrics:   Weight 177 lb on 9/23 increased from 171 lb on 8/26   NUTRITION DIAGNOSIS: Increased nutrient needs continue   INTERVENTION:  Patient prefers chocolate glucerna shake and provided samples today Encouraged patient to continue with well-balanced diet  Patient has contact information    MONITORING, EVALUATION, GOAL: Patient will consume adequate calories and protein to maintain weight   NEXT VISIT: see at pump removal October 9th  Charles [name], [place], [place] Registered Dietitian [phone] (pager)

## 2019-02-05 ENCOUNTER — Other Ambulatory Visit (HOSPITAL_COMMUNITY): Payer: Self-pay | Admitting: Hematology

## 2019-02-05 DIAGNOSIS — F419 Anxiety disorder, unspecified: Secondary | ICD-10-CM

## 2019-02-05 DIAGNOSIS — C20 Malignant neoplasm of rectum: Secondary | ICD-10-CM

## 2019-02-09 ENCOUNTER — Inpatient Hospital Stay (HOSPITAL_COMMUNITY): Payer: Medicare Other | Attending: Hematology

## 2019-02-09 ENCOUNTER — Inpatient Hospital Stay (HOSPITAL_BASED_OUTPATIENT_CLINIC_OR_DEPARTMENT_OTHER): Payer: Medicare Other | Admitting: Hematology

## 2019-02-09 ENCOUNTER — Other Ambulatory Visit: Payer: Self-pay

## 2019-02-09 ENCOUNTER — Inpatient Hospital Stay (HOSPITAL_COMMUNITY): Payer: Medicare Other

## 2019-02-09 ENCOUNTER — Encounter (HOSPITAL_COMMUNITY): Payer: Self-pay | Admitting: Hematology

## 2019-02-09 VITALS — BP 104/66 | HR 70 | Temp 96.9°F | Resp 18

## 2019-02-09 VITALS — BP 110/68 | HR 79 | Temp 97.1°F | Resp 18 | Wt 173.2 lb

## 2019-02-09 DIAGNOSIS — Z0000 Encounter for general adult medical examination without abnormal findings: Secondary | ICD-10-CM

## 2019-02-09 DIAGNOSIS — C20 Malignant neoplasm of rectum: Secondary | ICD-10-CM | POA: Diagnosis not present

## 2019-02-09 DIAGNOSIS — Z5112 Encounter for antineoplastic immunotherapy: Secondary | ICD-10-CM | POA: Insufficient documentation

## 2019-02-09 DIAGNOSIS — C7889 Secondary malignant neoplasm of other digestive organs: Secondary | ICD-10-CM | POA: Diagnosis not present

## 2019-02-09 DIAGNOSIS — Z5189 Encounter for other specified aftercare: Secondary | ICD-10-CM | POA: Diagnosis not present

## 2019-02-09 DIAGNOSIS — Z23 Encounter for immunization: Secondary | ICD-10-CM | POA: Insufficient documentation

## 2019-02-09 DIAGNOSIS — C7800 Secondary malignant neoplasm of unspecified lung: Secondary | ICD-10-CM | POA: Insufficient documentation

## 2019-02-09 DIAGNOSIS — Z5111 Encounter for antineoplastic chemotherapy: Secondary | ICD-10-CM | POA: Insufficient documentation

## 2019-02-09 LAB — CBC WITH DIFFERENTIAL/PLATELET
Abs Immature Granulocytes: 0.08 10*3/uL — ABNORMAL HIGH (ref 0.00–0.07)
Basophils Absolute: 0 10*3/uL (ref 0.0–0.1)
Basophils Relative: 1 %
Eosinophils Absolute: 0.2 10*3/uL (ref 0.0–0.5)
Eosinophils Relative: 2 %
HCT: 37.9 % — ABNORMAL LOW (ref 39.0–52.0)
Hemoglobin: 11.6 g/dL — ABNORMAL LOW (ref 13.0–17.0)
Immature Granulocytes: 1 %
Lymphocytes Relative: 18 %
Lymphs Abs: 1.6 10*3/uL (ref 0.7–4.0)
MCH: 29.1 pg (ref 26.0–34.0)
MCHC: 30.6 g/dL (ref 30.0–36.0)
MCV: 95 fL (ref 80.0–100.0)
Monocytes Absolute: 0.6 10*3/uL (ref 0.1–1.0)
Monocytes Relative: 7 %
Neutro Abs: 6.4 10*3/uL (ref 1.7–7.7)
Neutrophils Relative %: 71 %
Platelets: 209 10*3/uL (ref 150–400)
RBC: 3.99 MIL/uL — ABNORMAL LOW (ref 4.22–5.81)
RDW: 14.6 % (ref 11.5–15.5)
WBC: 8.8 10*3/uL (ref 4.0–10.5)
nRBC: 0 % (ref 0.0–0.2)

## 2019-02-09 LAB — URINALYSIS, DIPSTICK ONLY
Bilirubin Urine: NEGATIVE
Glucose, UA: NEGATIVE mg/dL
Hgb urine dipstick: NEGATIVE
Ketones, ur: NEGATIVE mg/dL
Leukocytes,Ua: NEGATIVE
Nitrite: NEGATIVE
Protein, ur: NEGATIVE mg/dL
Specific Gravity, Urine: 1.018 (ref 1.005–1.030)
pH: 5 (ref 5.0–8.0)

## 2019-02-09 LAB — COMPREHENSIVE METABOLIC PANEL
ALT: 16 U/L (ref 0–44)
AST: 17 U/L (ref 15–41)
Albumin: 3 g/dL — ABNORMAL LOW (ref 3.5–5.0)
Alkaline Phosphatase: 118 U/L (ref 38–126)
Anion gap: 8 (ref 5–15)
BUN: 9 mg/dL (ref 8–23)
CO2: 25 mmol/L (ref 22–32)
Calcium: 8.3 mg/dL — ABNORMAL LOW (ref 8.9–10.3)
Chloride: 106 mmol/L (ref 98–111)
Creatinine, Ser: 0.62 mg/dL (ref 0.61–1.24)
GFR calc Af Amer: >60 mL/min (ref >60)
GFR calc non Af Amer: >60 mL/min (ref >60)
Glucose, Bld: 188 mg/dL — ABNORMAL HIGH (ref 70–99)
Potassium: 4.1 mmol/L (ref 3.5–5.1)
Sodium: 139 mmol/L (ref 135–145)
Total Bilirubin: 0.4 mg/dL (ref 0.3–1.2)
Total Protein: 5.5 g/dL — ABNORMAL LOW (ref 6.5–8.1)

## 2019-02-09 LAB — MAGNESIUM: Magnesium: 1.7 mg/dL (ref 1.7–2.4)

## 2019-02-09 MED ORDER — SODIUM CHLORIDE 0.9 % IV SOLN
327.0000 mg/m2 | Freq: Once | INTRAVENOUS | Status: AC
  Administered 2019-02-09: 700 mg via INTRAVENOUS
  Filled 2019-02-09: qty 35

## 2019-02-09 MED ORDER — SODIUM CHLORIDE 0.9 % IV SOLN
1920.0000 mg/m2 | INTRAVENOUS | Status: DC
  Administered 2019-02-09: 4100 mg via INTRAVENOUS
  Filled 2019-02-09: qty 82

## 2019-02-09 MED ORDER — SODIUM CHLORIDE 0.9 % IV SOLN
Freq: Once | INTRAVENOUS | Status: AC
  Administered 2019-02-09: 10:00:00 via INTRAVENOUS
  Filled 2019-02-09: qty 5

## 2019-02-09 MED ORDER — FLUOROURACIL CHEMO INJECTION 2.5 GM/50ML
320.0000 mg/m2 | Freq: Once | INTRAVENOUS | Status: AC
  Administered 2019-02-09: 700 mg via INTRAVENOUS
  Filled 2019-02-09: qty 14

## 2019-02-09 MED ORDER — INFLUENZA VAC A&B SA ADJ QUAD 0.5 ML IM PRSY
0.5000 mL | PREFILLED_SYRINGE | Freq: Once | INTRAMUSCULAR | Status: AC
  Administered 2019-02-09: 0.5 mL via INTRAMUSCULAR

## 2019-02-09 MED ORDER — SODIUM CHLORIDE 0.9 % IV SOLN
Freq: Once | INTRAVENOUS | Status: AC
  Administered 2019-02-09: 11:00:00 via INTRAVENOUS

## 2019-02-09 MED ORDER — SODIUM CHLORIDE 0.9% FLUSH
10.0000 mL | INTRAVENOUS | Status: DC | PRN
  Administered 2019-02-09: 10 mL
  Filled 2019-02-09: qty 10

## 2019-02-09 MED ORDER — INFLUENZA VAC A&B SA ADJ QUAD 0.5 ML IM PRSY
PREFILLED_SYRINGE | INTRAMUSCULAR | Status: AC
  Filled 2019-02-09: qty 0.5

## 2019-02-09 MED ORDER — PALONOSETRON HCL INJECTION 0.25 MG/5ML
0.2500 mg | Freq: Once | INTRAVENOUS | Status: AC
  Administered 2019-02-09: 10:00:00 0.25 mg via INTRAVENOUS
  Filled 2019-02-09: qty 5

## 2019-02-09 MED ORDER — SODIUM CHLORIDE 0.9 % IV SOLN
90.0000 mg/m2 | Freq: Once | INTRAVENOUS | Status: AC
  Administered 2019-02-09: 200 mg via INTRAVENOUS
  Filled 2019-02-09: qty 10

## 2019-02-09 MED ORDER — SODIUM CHLORIDE 0.9 % IV SOLN
5.0000 mg/kg | Freq: Once | INTRAVENOUS | Status: AC
  Administered 2019-02-09: 400 mg via INTRAVENOUS
  Filled 2019-02-09: qty 16

## 2019-02-09 MED ORDER — ATROPINE SULFATE 1 MG/ML IJ SOLN
0.5000 mg | Freq: Once | INTRAMUSCULAR | Status: AC | PRN
  Administered 2019-02-09: 0.5 mg via INTRAVENOUS
  Filled 2019-02-09: qty 1

## 2019-02-09 MED ORDER — SODIUM CHLORIDE 0.9 % IV SOLN
Freq: Once | INTRAVENOUS | Status: AC
  Administered 2019-02-09: 10:00:00 via INTRAVENOUS

## 2019-02-09 NOTE — Progress Notes (Signed)
0940 Lab and urine results reviewed with and pt seen by Dr. [name] and pt approved for chemo tx today per MD  [name] tolerated chemo tx well without complaints or incident. Pt discharged with 5FU pump infusing without issues. VSS upon discharge. Pt discharged self ambulatory in satisfactory condition

## 2019-02-09 NOTE — Patient Instructions (Addendum)
[place] at [facility] Discharge Instructions  You were seen today by Dr. [name]. He went over your recent lab results. He will schedule you for repeat scans prior to your next visit. He will see you back in 2 weeks for labs and follow up.   Thank you for choosing [place] at [facility] to provide your oncology and hematology care.  To afford each patient quality time with our provider, please arrive at least 15 minutes before your scheduled appointment time.   If you have a lab appointment with the [place] please come in thru the  Main Entrance and check in at the main information desk  You need to re-schedule your appointment should you arrive 10 or more minutes late.  We strive to give you quality time with our providers, and arriving late affects you and other patients whose appointments are after yours.  Also, if you no show three or more times for appointments you may be dismissed from the clinic at the providers discretion.     Again, thank you for choosing [facility].  Our hope is that these requests will decrease the amount of time that you wait before being seen by our physicians.       _____________________________________________________________  Should you have questions after your visit to [facility], please contact our office at [phone] between the hours of 8:00 a.m. and 4:30 p.m.  Voicemails left after 4:00 p.m. will not be returned until the following business day.  For prescription refill requests, have your pharmacy contact our office and allow 72 hours.    Cancer Center Support Programs:   > Cancer Support Group  2nd Tuesday of the month 1pm-2pm, Journey Room

## 2019-02-09 NOTE — Assessment & Plan Note (Signed)
1.  Stage IV rectal cancer with lung and pancreatic metastasis: Foundation 1 CDX shows KRAS G13D and NRAS wild type, MS-stable. -12 cycles of FOLFOX in the adjuvant setting from October 12 through 07/14/2011. -15 cycles of FOLFOX and bevacizumab from 07/08/2016 through 01/20/2017 followed by infusional 5-FU and bevacizumab until 07/14/2017. -Maintenance Xeloda from 08/07/2017 with bevacizumab through 08/19/2018 with progression. - ERCP and stent placement on 09/16/2018 showing single localized biliary stricture.  Biliary sphincterotomy was performed with stricture dilated to 6 mm with balloon to facilitate stent placement.  1 covered metal stent was placed. -Flexible sigmoidoscopy on 09/24/2018 showing narrowing of the colorectal anastomosis.  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 - 2 cycles of FOLFIRI from 10/04/2018 through 10/25/2018 with 20% dose reduction during cycle 2. - He has developed severe tiredness after cycle 2 even after dose reduction. 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  - Cycle 7 of chemotherapy was on 01/26/2019.  He had diarrhea for 1 day which is controlled with the Lomotil. -We reviewed his blood work.  Last CEA was 4.6 on 01/26/2019. - His weight is stable. -We will proceed with cycle 8 today.  We will see him back in 2 weeks for follow-up.  I plan to repeat CT CAP prior to next visit.   2.  Weight loss: -He is taking Megace twice daily.  Drinking Ensure 4 cans/day.  Eating 3 meals per day. -His weight is stable.  3.  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  4.  Anxiety: -He takes Klonopin 1 mg twice daily.

## 2019-02-09 NOTE — Patient Instructions (Signed)
[facility] Discharge Instructions for Patients Receiving Chemotherapy   Beginning January 23rd 2017 lab work for the [facility] will be done in the  Main lab at [facility] on 1st floor. If you have a lab appointment with the [place] please come in thru the  Main Entrance and check in at the main information desk   Today you received the following chemotherapy agents Bevacizumab,Irinotecan,Leucovorin and 5FU. Follow-up as scheduled. Call clinic for any questions or concerns  To help prevent nausea and vomiting after your treatment, we encourage you to take your nausea medication   If you develop nausea and vomiting, or diarrhea that is not controlled by your medication, call the clinic.  The clinic phone number is [phone]. Office hours are Monday-Friday 8:30am-5:00pm.  BELOW ARE SYMPTOMS THAT SHOULD BE REPORTED IMMEDIATELY:  *FEVER GREATER THAN 101.0 F  *CHILLS WITH OR WITHOUT FEVER  NAUSEA AND VOMITING THAT IS NOT CONTROLLED WITH YOUR NAUSEA MEDICATION  *UNUSUAL SHORTNESS OF BREATH  *UNUSUAL BRUISING OR BLEEDING  TENDERNESS IN MOUTH AND THROAT WITH OR WITHOUT PRESENCE OF ULCERS  *URINARY PROBLEMS  *BOWEL PROBLEMS  UNUSUAL RASH Items with * indicate a potential emergency and should be followed up as soon as possible. If you have an emergency after office hours please contact your primary care physician or go to the nearest emergency department.  Please call the clinic during office hours if you have any questions or concerns.   You may also contact the Patient Navigator at [phone] should you have any questions or need assistance in obtaining follow up care.      Resources For Cancer Patients and their Caregivers ? American Cancer Society: Can assist with transportation, wigs, general needs, runs Look Good Feel Better.        (208)878-9230 ? Cancer Care: Provides financial assistance, online support groups, medication/co-pay  assistance.  1-800-813-HOPE 401-036-1675) ? [employer] Assists [place] cancer patients and their families through emotional , educational and financial support.  952-635-6026 ? [place] DSS Where to apply for food stamps, Medicaid and utility assistance. 9415718251 ? [facility]: Transportation to medical appointments. 845-734-8867 ? Social Security Administration: May apply for disability if have a Stage IV cancer. 902-467-3250 (808)758-1892 ? [employer], Disability and Transit Services: Assists with nutrition, care and transit needs. 949-199-2812

## 2019-02-09 NOTE — Progress Notes (Signed)
[place] [place], [place] [address]   CLINIC:  Medical Oncology/Hematology  PCP:  [name], MD [place] [address] [phone]   REASON FOR VISIT:  Follow-up for Stage IV Colon Cancer  CURRENT THERAPY: FOLFIRI  BRIEF ONCOLOGIC HISTORY:  Oncology History  Rectal cancer ([place])  07/24/2010 Initial Diagnosis   Invasive adenocarcinoma of rectum   09/09/2010 Concurrent Chemotherapy   S/P radiation and concurrent 5-FU continuous infusion from 09/09/10- 10/10/10.   11/14/2010 Surgery   S/P proctectomy with colorectal anastomosis and diverting loop ileostomy on 11/14/10 at [facility] by Dr. Harlon Ditty. Pathology reveals a pT3b N1 with 3/20 lymph nodes.   02/05/2011 - 07/14/2011 Chemotherapy   FOLFOX   08/18/2011 Surgery   Approximate date of surgery- [place] by Dr. Harlon Ditty    Remission     11/24/2013 Survivorship   Colonoscopy- somewhat fibrotic/friable anastomotic mucosal-status post biopsy (narrowing not felt to be clinically significant).  Negative pathology for malignancy   09/12/2014 Imaging   DVT in the left femoral venous system, left common iliac vein, IVC, and within the IVC filter Right lower extremity venography confirms chronic occlusion of the femoral venous system with collateralization. The right iliac venous system is patent and do   09/25/2014 PET scan   The right middle lobe pulmonary nodule is hypermetabolic, favored to represent a primary bronchogenic carcinoma.Equivocal mediastinal nodes, similar to surrounding blood pool. Bilateral adrenal hypermetabolism, felt to be physiologic   10/24/2014 Pathology Results   Lung, needle/core biopsy(ies), RML - ADENOCARCINOMA, SEE COMMENT metastatic adenocarcinoma of a colorectal primary   10/24/2014 Relapse/Recurrence     11/16/2014 Definitive Surgery   Bronchoscopy, right video-assisted thoracoscopy, wedge resection of right middle lobe by Dr. Servando Snare   11/16/2014  Pathology Results   Lung, wedge biopsy/resection, right lingula and small portion of middle lobe - METASTATIC ADENOCARCINOMA, CONSISTENT WITH COLORECTAL PRIMARY, SPANNING 2.0 CM. - THE SURGICAL RESECTION MARGINS ARE NEGATIVE FOR ADENOCARCINOMA.   11/16/2014 Remission   THE SURGICAL RESECTION MARGINS ARE NEGATIVE FOR ADENOCARCINOMA.   03/07/2015 Imaging   CT CAP- Interval resection of right middle lobe metastasis. No acute process or evidence of metastatic disease in the chest, abdomen or pelvis. Improved right upper lobe reticular nodular opacities are favored to represent resolving infection.   09/14/2015 Imaging   CT CAP- No findings of recurrent malignancy. No recurrence along the wedge resection site of the right middle lobe. Right anterior abdominal wall focal hernia containing a knuckle of small bowel without complicating feature.   06/13/2016 Imaging   CT CAP- 1. New right-sided pleural metastasis/mass. Other smaller right-sided pulmonary nodules which are all pleural-based and most likely represent pleural metastasis. 2. No convincing evidence of abdominopelvic nodal metastasis. 3. Constellation of findings, including pancreatic atrophy, duct dilatation, and pancreatic head soft tissue fullness which are highly suspicious for pancreatic adenocarcinoma. Metastatic disease felt much less likely. Consider endoscopic ultrasound sampling or ERCP. Cannot exclude superimposed acute pancreatitis. 4. New enlargement of the appendiceal tip with subtle surrounding edema. Cannot exclude early or mild appendicitis. 5. Coronary artery atherosclerosis. Aortic atherosclerosis. 6. 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   06/13/2016 Progression   CT scan demonstrates progression of disease   06/19/2016 Procedure   EUS with FNA by Dr. Ardis Hughs   06/20/2016 Pathology Results   FINE NEEDLE ASPIRATION, ENDOSCOPIC, PANCREAS UNCINATE AREA(SPECIMEN 1 OF 1 COLLECTED  06/19/16): MALIGNANT CELLS CONSISTENT WITH METASTATIC ADENOCARCINOMA.  06/25/2016 PET scan   1. Two pleural-based nodules in the right hemithorax are hypermetabolic. Metastatic disease is a distinct consideration. The scattered pulmonary parenchymal nodule seen on previous diagnostic CT imaging are below the threshold for reliable resolution on PET imaging. 2. Hypermetabolic lesion pancreatic head, consistent with neoplasm. As noted on prior CT, adenocarcinoma is any consideration. No evidence for hypermetabolic abdominal lymphadenopathy. Mottled uptake noted in the liver, but no discrete hepatic metastases are evident on PET imaging. 3. Appendix remains distended up to 0.9-10 mm diameter with a stone towards the tip in subtle periappendiceal edema/inflammation. The appendix is hypermetabolic along its length. Imaging features are relatively stable in the 12 day interval since prior CT scan. While appendicitis is a consideration, the relative stability over 12 days would be unusual for that etiology. Continued close follow-up recommended.   06/26/2016 Pathology Results   Not enough tissue for foundationONE or K-ras testing.   07/07/2016 Procedure   Port placed by Dr. Arnoldo Morale   07/08/2016 -  Chemotherapy   The patient had pegfilgrastim (NEULASTA) injection 6 mg, 6 mg, Subcutaneous,  Once, 0 of 4 cycles  ondansetron (ZOFRAN) IVPB 8 mg, 8 mg, Intravenous,  Once, 0 of 4 cycles  leucovorin 804 mg in dextrose 5 % 250 mL infusion, 400 mg/m2, Intravenous,  Once, 0 of 4 cycles  oxaliplatin (ELOXATIN) 170 mg in dextrose 5 % 500 mL chemo infusion, 85 mg/m2, Intravenous,  Once, 0 of 4 cycles  fluorouracil (ADRUCIL) 4,800 mg in sodium chloride 0.9 % 150 mL chemo infusion, 2,400 mg/m2 = 4,800 mg, Intravenous, 1 Day/Dose, 0 of 4 cycles  fluorouracil (ADRUCIL) chemo injection 800 mg, 400 mg/m2, Intravenous,  Once, 0 of 4 cycles  pegfilgrastim (NEULASTA) injection 6 mg, 6 mg, Subcutaneous,  Once, 2  of 2 cycles  ondansetron (ZOFRAN) IVPB 8 mg, 8 mg, Intravenous,  Once, 12 of 12 cycles  leucovorin 660 mg in dextrose 5 % 250 mL infusion, 660 mg (original dose ), Intravenous,  Once, 12 of 12 cycles Dose modification: 660 mg (Cycle 1)  oxaliplatin (ELOXATIN) 140 mg in dextrose 5 % 500 mL chemo infusion, 140 mg (original dose ), Intravenous,  Once, 12 of 12 cycles Dose modification: 140 mg (Cycle 1), 119 mg (85 % of original dose 140 mg, Cycle 8, Reason: Provider Judgment)  fluorouracil (ADRUCIL) 3,850 mg in sodium chloride 0.9 % 150 mL chemo infusion, 3,850 mg (original dose ), Intravenous, 1 Day/Dose, 12 of 12 cycles Dose modification: 3,850 mg (Cycle 1)  fluorouracil (ADRUCIL) chemo injection 700 mg, 700 mg (original dose ), Intravenous,  Once, 12 of 12 cycles Dose modification: 700 mg (Cycle 1)  palonosetron (ALOXI) injection 0.25 mg, 0.25 mg, Intravenous,  Once, 7 of 12 cycles Administration: 0.25 mg (09/30/2016)  bevacizumab (AVASTIN) 475 mg in sodium chloride 0.9 % 100 mL chemo infusion, 5 mg/kg = 475 mg, Intravenous,  Once, 6 of 11 cycles Administration: 500 mg (09/30/2016)  leucovorin 880 mg in dextrose 5 % 250 mL infusion, 400 mg/m2 = 880 mg, Intravenous,  Once, 7 of 12 cycles Administration: 880 mg (09/30/2016)  oxaliplatin (ELOXATIN) 185 mg in dextrose 5 % 500 mL chemo infusion, 85 mg/m2 = 185 mg, Intravenous,  Once, 7 of 12 cycles Administration: 185 mg (09/30/2016)  fluorouracil (ADRUCIL) chemo injection 900 mg, 400 mg/m2 = 900 mg, Intravenous,  Once, 7 of 12 cycles Administration: 900 mg (09/30/2016)  fluorouracil (ADRUCIL) 5,300 mg in sodium chloride 0.9 % 144 mL chemo infusion, 2,400 mg/m2 = 5,300  mg, Intravenous, 1 Day/Dose, 7 of 12 cycles Administration: 5,300 mg (09/30/2016)  for chemotherapy treatment.     08/27/2016 Imaging   CT CAP- 1. Pulmonary metastatic disease is stable. 2. Pancreatic head mass, grossly stable, with progressive atrophy of the body and tail  of the pancreas. Stable peripancreatic lymph node. 3. Mild circumferential rectal wall thickening, stable. 4. Aortic atherosclerosis (ICD10-170.0). Coronary artery calcification. 5. Hyperattenuating lesion off the lower pole left kidney, stable, too small to characterize. Continued attention on followup exams is warranted. 6. Persistent wall thickening and mild dilatation of the proximal appendix, of uncertain etiology. Continued attention on followup exams is warranted.   11/06/2016 Imaging   CT C/A/P: IMPRESSION: 1. Stable pulmonary metastatic disease. 2. Similar appearing pancreatic head mass. Progressed atrophy of the pancreatic body and tail. Stable peripancreatic lymph node. 3. Stable hypoattenuating lesion partially exophytic off the inferior pole of the left kidney. 4. Aortic atherosclerosis. 5. Persistent mild wall thickening and mild dilatation of the appendix.    01/30/2017 Imaging   Ct C/A/P: IMPRESSION: 1. Stable pulmonary metastatic disease. 2. Stable to slight increase in size of pancreatic head mass. 3.  Aortic Atherosclerosis (ICD10-I70.0). 4. Similar appearance of mild wall thickening of the appendix which contains an appendicolith.   05/01/2017 Imaging   CT C/A/P: Stable disease in the lungs, no progression of disease in the abdomen.   10/04/2018 -  Chemotherapy   The patient had palonosetron (ALOXI) injection 0.25 mg, 0.25 mg, Intravenous,  Once, 8 of 9 cycles Administration: 0.25 mg (10/04/2018), 0.25 mg (10/25/2018), 0.25 mg (12/01/2018), 0.25 mg (12/15/2018), 0.25 mg (12/29/2018), 0.25 mg (01/12/2019), 0.25 mg (01/26/2019), 0.25 mg (02/09/2019) pegfilgrastim (NEULASTA) injection 6 mg, 6 mg, Subcutaneous, Once, 2 of 2 cycles Administration: 6 mg (10/27/2018) pegfilgrastim-cbqv (UDENYCA) injection 6 mg, 6 mg, Subcutaneous, Once, 6 of 7 cycles Administration: 6 mg (12/03/2018), 6 mg (12/17/2018), 6 mg (12/31/2018), 6 mg (01/14/2019), 6 mg (01/28/2019) bevacizumab (AVASTIN)  450 mg in sodium chloride 0.9 % 100 mL chemo infusion, 5 mg/kg = 450 mg, Intravenous,  Once, 4 of 4 cycles Administration: 400 mg (12/01/2018), 400 mg (12/15/2018), 400 mg (12/29/2018) irinotecan (CAMPTOSAR) 380 mg in sodium chloride 0.9 % 500 mL chemo infusion, 180 mg/m2 = 380 mg, Intravenous,  Once, 8 of 9 cycles Dose modification: 144 mg/m2 (original dose 180 mg/m2, Cycle 2, Reason: Provider Judgment, Comment: 20% dose reduction), 90 mg/m2 (50 % of original dose 180 mg/m2, Cycle 3, Reason: Other (see comments), Comment: [id number]) Administration: 380 mg (10/04/2018), 300 mg (10/25/2018), 200 mg (12/01/2018), 200 mg (12/15/2018), 200 mg (12/29/2018), 200 mg (01/12/2019), 200 mg (01/26/2019), 200 mg (02/09/2019) leucovorin 900 mg in sodium chloride 0.9 % 250 mL infusion, 421 mg/m2 = 856 mg, Intravenous,  Once, 8 of 9 cycles Dose modification: 320 mg/m2 (original dose 400 mg/m2, Cycle 2, Reason: Provider Judgment, Comment: 20% dose reduction) Administration: 900 mg (10/04/2018), 700 mg (10/25/2018), 700 mg (12/01/2018), 700 mg (12/15/2018), 700 mg (12/29/2018), 700 mg (01/12/2019), 700 mg (01/26/2019), 700 mg (02/09/2019) fluorouracil (ADRUCIL) chemo injection 850 mg, 400 mg/m2 = 850 mg, Intravenous,  Once, 8 of 9 cycles Dose modification: 320 mg/m2 (original dose 400 mg/m2, Cycle 2, Reason: Provider Judgment, Comment: 20% dose reduction) Administration: 850 mg (10/04/2018), 700 mg (10/25/2018), 700 mg (12/01/2018), 700 mg (12/15/2018), 700 mg (12/29/2018), 700 mg (01/12/2019), 700 mg (01/26/2019), 700 mg (02/09/2019) fosaprepitant (EMEND) 150 mg, dexamethasone (DECADRON) 12 mg in sodium chloride 0.9 % 145 mL IVPB, , Intravenous,  Once, 6 of 7 cycles  Administration:  (12/01/2018),  (12/15/2018),  (12/29/2018),  (01/12/2019),  (01/26/2019),  (02/09/2019) fluorouracil (ADRUCIL) 5,000 mg in sodium chloride 0.9 % 150 mL chemo infusion, 2,325 mg/m2 = 5,150 mg, Intravenous, 1 Day/Dose, 8 of 9 cycles Dose modification: 1,920 mg/m2 (original dose 2,400  mg/m2, Cycle 2, Reason: Provider Judgment, Comment: 20% dose reduction) Administration: 5,000 mg (10/04/2018), 4,100 mg (10/25/2018), 4,100 mg (12/01/2018), 4,100 mg (12/15/2018), 4,100 mg (12/29/2018), 4,100 mg (01/12/2019), 4,100 mg (01/26/2019), 4,100 mg (02/09/2019) bevacizumab-bvzr (ZIRABEV) 400 mg in sodium chloride 0.9 % 100 mL chemo infusion, 5 mg/kg = 400 mg (100 % of original dose 5 mg/kg), Intravenous,  Once, 3 of 4 cycles Dose modification: 5 mg/kg (original dose 5 mg/kg, Cycle 6) Administration: 400 mg (01/12/2019), 400 mg (01/26/2019), 400 mg (02/09/2019)  for chemotherapy treatment.       CANCER STAGING: Cancer Staging Rectal cancer ([place]) Staging form: Colon and Rectum, AJCC 7th Edition - Clinical: Stage IIIB (T3, N1, M0) - Signed by [name], MD on 02/04/2011 - Pathologic stage from 11/17/2014: Stage IVA (M1a) - Signed by Baird Cancer, PA-C on 09/14/2015    INTERVAL HISTORY:  Mr. Friedhoff 68 y.o. male seen for follow-up and next cycle of chemotherapy.  Had diarrhea for 1 day which was controlled well with Lomotil.  Leg swelling is also stable.  Appetite and energy levels are 75%.  He is drinking 4 cans of Ensure per day.  He is eating 3 meals per day.  His weight is more or less stable.  Denies any fevers or chills.  Numbness in the extremities is stable.  REVIEW OF SYSTEMS:  Review of Systems  Gastrointestinal: Positive for diarrhea.  Neurological: Positive for numbness.  All other systems reviewed and are negative.    PAST MEDICAL/SURGICAL HISTORY:  Past Medical History:  Diagnosis Date   Chronic anticoagulation    Colon cancer ([place]) 07/24/2010   rectal ca, inv adenocarcinoma   Depression 04/01/2011   Diabetes mellitus without complication ([place])    DVT (deep venous thrombosis) ([place]) 05/09/2011   GERD (gastroesophageal reflux disease)    High output ileostomy ([place]) 05/21/2011   History of kidney stones    HTN (hypertension)    Hx of radiation therapy  09/02/10 to 10/14/10   pelvis   Lung metastasis ([facility])    Neuropathy    Peripheral vascular disease ([place])    dvt's,pe   Pneumonia    hx x3   Pulmonary embolism ([place])    Rectal cancer ([place]) 01/15/2011   S/P radiation and concurrent 5-FU continuous infusion from 09/09/10- 10/10/10.  S/P proctectomy with colorectal anastomosis and diverting loop ileostomy on 11/14/10 at [facility] by Dr. Harlon Ditty. Pathology reveals a pT3b N1 with 3/20 lymph nodes.     Past Surgical History:  Procedure Laterality Date   BILIARY STENT PLACEMENT N/A 09/16/2018   Procedure: STENT PLACEMENT;  Surgeon: Rogene Houston, MD;  [facility];  Service: Endoscopy;  Laterality: N/A;   BIOPSY  09/24/2018   Procedure: BIOPSY;  Surgeon: Danie Binder, MD;  [facility];  Service: Endoscopy;;   COLON SURGERY  11/14/2010   proctectomy with colorectal anastomosis and diverting loop ileostomy (temporary planned)   COLONOSCOPY  07/2010   proximal rectal apple core mass 10-14cm from anal verge (adenocarcinoma), 2-3cm distal rectal carpet polyp s/p piecemeal snare polypectomy (adenoma)   COLONOSCOPY  04/21/2012   RMR: Friable,fibrotic appearing colorectal anastomosis producing some luminal narrowing-not felt to be critical. path: focal erosion  with slight inflammation and hyperemia. SURVEILLANCE DUE DEC 2015   COLONOSCOPY N/A 11/24/2013   Dr. Rourk:somewhat fibrotic/friable anastomotic mucosal-status post biopsy (narrowing not felt to be clinically significant) Single colonic diverticulum. benign polypoid rectal mucosa   colostomy reversal  april 2013   ERCP N/A 09/16/2018   Procedure: ENDOSCOPIC RETROGRADE CHOLANGIOPANCREATOGRAPHY (ERCP);  Surgeon: Rogene Houston, MD;  [facility];  Service: Endoscopy;  Laterality: N/A;   ESOPHAGOGASTRODUODENOSCOPY  07/2010   RMR: schatki ring s/p dilation, small hh, SB bx benign   ESOPHAGOGASTRODUODENOSCOPY N/A 09/04/2015   Procedure:  ESOPHAGOGASTRODUODENOSCOPY (EGD);  Surgeon: Daneil Dolin, MD;  [facility];  Service: Endoscopy;  Laterality: N/A;  730   EUS  08/2010   Dr. Owens Loffler. uT3N0 circumferential, nearly obstruction rectosigmoid adenocarcinoma, distal edge 12cm from anal verge   EUS N/A 06/19/2016   Procedure: UPPER ENDOSCOPIC ULTRASOUND (EUS) RADIAL;  Surgeon: Milus Banister, MD;  Location: [facility];  Service: Endoscopy;  Laterality: N/A;   FLEXIBLE SIGMOIDOSCOPY N/A 09/24/2018   Procedure: FLEXIBLE SIGMOIDOSCOPY;  Surgeon: Danie Binder, MD;  [facility];  Service: Endoscopy;  Laterality: N/A;   HERNIA REPAIR     abd hernia repair   IVC filter     ivc filter     port a cath placement     PORT-A-CATH REMOVAL  09/24/2011   Procedure: REMOVAL PORT-A-CATH;  Surgeon: Donato Heinz, MD;  Location: [facility];  Service: General;  Laterality: N/A;  Minor Room   PORTACATH PLACEMENT Right 07/07/2016   Procedure: INSERTION PORT-A-CATH;  Surgeon: Aviva Signs, MD;  Location: [facility];  Service: General;  Laterality: Right;   SPHINCTEROTOMY N/A 09/16/2018   Procedure: SPHINCTEROTOMY with balloon dialation;  Surgeon: Rogene Houston, MD;  [facility];  Service: Endoscopy;  Laterality: N/A;   TRANSVERSE LOOP COLOSTOMY N/A 09/29/2018   Procedure: TRANSVERSE LOOP COLOSTOMY;  Surgeon: Aviva Signs, MD;  Location: [facility];  Service: General;  Laterality: N/A;   VIDEO ASSISTED THORACOSCOPY (VATS)/WEDGE RESECTION Right 11/15/2014   Procedure: VIDEO ASSISTED THORACOSCOPY (VATS)/LUNG RESECTION WITH RIGHT LINGULECTOMY;  Surgeon: [name], MD;  Location: [facility];  Service: Thoracic;  Laterality: Right;   VIDEO BRONCHOSCOPY N/A 11/15/2014   Procedure: VIDEO BRONCHOSCOPY;  Surgeon: [name], MD;  Location: [facility] OR;  Service: Thoracic;  Laterality: N/A;     SOCIAL HISTORY:  Social History   Socioeconomic History   Marital status: Married    Spouse name: Not on file    Number of children: 2   Years of education: Not on file   Highest education level: Not on file  Occupational History   Occupation: self-employed Regulatory affairs officer, currently not working    Fish farm manager: [place] resource strain: Not very hard   Food insecurity    Worry: Never true    Inability: Never true   Transportation needs    Medical: No    Non-medical: No  Tobacco Use   Smoking status: Never Smoker   Smokeless tobacco: Never Used  Substance and Sexual Activity   Alcohol use: No   Drug use: No   Sexual activity: Yes    Birth control/protection: None    Comment: married  Lifestyle   Physical activity    Days per week: 0 days    Minutes per session: 0 min   Stress: Only a little  Relationships   Social connections    Talks on phone:  More than three times a week    Gets together: Once a week    Attends religious service: Patient refused    Active member of club or organization: Patient refused    Attends meetings of clubs or organizations: Patient refused    Relationship status: Patient refused   Intimate partner violence    Fear of current or ex partner: Patient refused    Emotionally abused: Patient refused    Physically abused: Patient refused    Forced sexual activity: Patient refused  Other Topics Concern   Not on file  Social History Narrative   Not on file    FAMILY HISTORY:  Family History  Problem Relation Age of Onset   Cancer Brother        throat   Cancer Brother        prostate   Colon cancer Neg Hx    Liver disease Neg Hx    Inflammatory bowel disease Neg Hx     CURRENT MEDICATIONS:  Outpatient Encounter Medications as of 02/09/2019  Medication Sig   clonazePAM (KLONOPIN) 1 MG tablet Take 1 tablet by mouth three times daily as needed   Diphenhyd-Hydrocort-Nystatin (FIRST-DUKES MOUTHWASH) SUSP Use as directed 5 mLs in the mouth or throat 4 (four) times daily.   dronabinol  (MARINOL) 5 MG capsule Take 1 capsule (5 mg total) by mouth 2 (two) times daily before a meal.   gabapentin (NEURONTIN) 300 MG capsule TAKE 3 CAPSULES BY MOUTH TWICE DAILY   glimepiride (AMARYL) 2 MG tablet Take 2 mg by mouth 2 (two) times daily before a meal.    hydrocortisone (CORTEF) 20 MG tablet Take 20 mg by mouth daily.    megestrol (MEGACE) 400 MG/10ML suspension Take 10 mLs (400 mg total) by mouth 2 (two) times daily.   mirtazapine (REMERON) 7.5 MG tablet Take 7.5 mg by mouth at bedtime.    omeprazole (PRILOSEC) 20 MG capsule Take 20 mg by mouth 2 (two) times daily before a meal.    pantoprazole (PROTONIX) 40 MG tablet Take 40 mg by mouth daily.    Probiotic Product (PROBIOTIC DAILY PO) Take 1 capsule by mouth daily.   rivaroxaban (XARELTO) 20 MG TABS tablet Take 1 tablet (20 mg total) by mouth daily with supper.   traZODone (DESYREL) 100 MG tablet Take 1 tablet (100 mg total) by mouth at bedtime.   vitamin B-12 (CYANOCOBALAMIN) 1000 MCG tablet Take 1,000 mcg by mouth daily.   acetaminophen (TYLENOL) 500 MG tablet Take 1,000 mg by mouth every 6 (six) hours as needed for mild pain or moderate pain.    diphenoxylate-atropine (LOMOTIL) 2.5-0.025 MG tablet Take 2 tablets by mouth 3 (three) times daily as needed for diarrhea or loose stools. (Patient not taking: Reported on 02/09/2019)   ondansetron (ZOFRAN) 8 MG tablet Take 1 tablet (8 mg total) by mouth 2 (two) times daily as needed for refractory nausea / vomiting. Start on day 3 after chemotherapy. (Patient not taking: Reported on 01/26/2019)   oxyCODONE-acetaminophen (PERCOCET) 10-325 MG tablet Take 1 tablet by mouth every 6 (six) hours as needed for pain. (Patient not taking: Reported on 01/26/2019)   prochlorperazine (COMPAZINE) 10 MG tablet Take 1 tablet (10 mg total) by mouth every 6 (six) hours as needed for nausea or vomiting. (Patient not taking: Reported on 01/26/2019)   promethazine (PHENERGAN) 25 MG tablet Take 1  tablet (25 mg total) by mouth every 8 (eight) hours as needed for refractory nausea / vomiting. (Patient not taking:  Reported on 01/26/2019)   Facility-Administered Encounter Medications as of 02/09/2019  Medication   [COMPLETED] influenza vaccine adjuvanted (FLUAD) injection 0.5 mL   sodium chloride 0.9 % injection 10 mL   sodium chloride flush (NS) 0.9 % injection 10 mL    ALLERGIES:  Allergies  Allergen Reactions   Oxycodone     Blisters, hallucinations   Tramadol     Blisters, hallucinations   Trazodone And Nefazodone Other (See Comments)    hallucinations     PHYSICAL EXAM:  ECOG Performance status: 1  Vitals:   02/09/19 0847  BP: 110/68  Pulse: 79  Resp: 18  Temp: (!) 97.1 F (36.2 C)  SpO2: 100%   Filed Weights   02/09/19 0847  Weight: 173 lb 3.2 oz (78.6 kg)    Physical Exam Constitutional:      Appearance: Normal appearance.  HENT:     Head: Normocephalic.     Nose: Nose normal.     Mouth/Throat:     Mouth: Mucous membranes are moist.     Pharynx: Oropharynx is clear.  Eyes:     Extraocular Movements: Extraocular movements intact.     Conjunctiva/sclera: Conjunctivae normal.  Neck:     Musculoskeletal: Normal range of motion.  Cardiovascular:     Rate and Rhythm: Regular rhythm. Tachycardia present.     Pulses: Normal pulses.     Heart sounds: Normal heart sounds.  Pulmonary:     Effort: Pulmonary effort is normal.     Breath sounds: Normal breath sounds.  Abdominal:     General: Abdomen is flat. Bowel sounds are normal.     Palpations: Abdomen is soft.  Musculoskeletal: Normal range of motion.  Skin:    General: Skin is warm and dry.  Neurological:     General: No focal deficit present.     Mental Status: He is alert and oriented to person, place, and time.  Psychiatric:        Mood and Affect: Mood normal.        Behavior: Behavior normal.        Thought Content: Thought content normal.        Judgment: Judgment normal.       LABORATORY DATA:  I have reviewed the labs as listed.  CBC    Component Value Date/Time   WBC 8.8 02/09/2019 0827   RBC 3.99 (L) 02/09/2019 0827   HGB 11.6 (L) 02/09/2019 0827   HGB 11.6 (L) 02/04/2011 1059   HCT 37.9 (L) 02/09/2019 0827   HCT 34.7 (L) 02/04/2011 1059   PLT 209 02/09/2019 0827   PLT 282 02/04/2011 1059   MCV 95.0 02/09/2019 0827   MCV 76.0 (L) 02/04/2011 1059   MCH 29.1 02/09/2019 0827   MCHC 30.6 02/09/2019 0827   RDW 14.6 02/09/2019 0827   RDW 18.5 (H) 02/04/2011 1059   LYMPHSABS 1.6 02/09/2019 0827   LYMPHSABS 1.1 02/04/2011 1059   MONOABS 0.6 02/09/2019 0827   MONOABS 0.5 02/04/2011 1059   EOSABS 0.2 02/09/2019 0827   EOSABS 0.1 02/04/2011 1059   BASOSABS 0.0 02/09/2019 0827   BASOSABS 0.1 02/04/2011 1059   CMP Latest Ref Rng & Units 02/09/2019 01/26/2019 01/12/2019  Glucose 70 - 99 mg/dL 188(H) 206(H) 185(H)  BUN 8 - 23 mg/dL '9 10 10  ' Creatinine 0.61 - 1.24 mg/dL 0.62 0.65 0.65  Sodium 135 - 145 mmol/L 139 141 136  Potassium 3.5 - 5.1 mmol/L 4.1 4.5 4.2  Chloride 98 - 111 mmol/L  106 110 105  CO2 22 - 32 mmol/L '25 24 24  ' Calcium 8.9 - 10.3 mg/dL 8.3(L) 8.2(L) 8.2(L)  Total Protein 6.5 - 8.1 g/dL 5.5(L) 5.3(L) 5.5(L)  Total Bilirubin 0.3 - 1.2 mg/dL 0.4 0.5 0.4  Alkaline Phos 38 - 126 U/L 118 110 115  AST 15 - 41 U/L '17 15 17  ' ALT 0 - 44 U/L '16 16 17    ' I have personally reviewed the scans and discussed with the patient.   ASSESSMENT & PLAN:   Rectal cancer 1.  Stage IV rectal cancer with lung and pancreatic metastasis: Foundation 1 CDX shows KRAS G13D and NRAS wild type, MS-stable. -12 cycles of FOLFOX in the adjuvant setting from October 12 through 07/14/2011. -15 cycles of FOLFOX and bevacizumab from 07/08/2016 through 01/20/2017 followed by infusional 5-FU and bevacizumab until 07/14/2017. -Maintenance Xeloda from 08/07/2017 with bevacizumab through 08/19/2018 with progression. - ERCP and stent placement on 09/16/2018 showing single  localized biliary stricture.  Biliary sphincterotomy was performed with stricture dilated to 6 mm with balloon to facilitate stent placement.  1 covered metal stent was placed. -Flexible sigmoidoscopy on 09/24/2018 showing narrowing of the colorectal anastomosis.  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 - 2 cycles of FOLFIRI from 10/04/2018 through 10/25/2018 with 20% dose reduction during cycle 2. - He has developed severe tiredness after cycle 2 even after dose reduction. 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  - Cycle 7 of chemotherapy was on 01/26/2019.  He had diarrhea for 1 day which is controlled with the Lomotil. -We reviewed his blood work.  Last CEA was 4.6 on 01/26/2019. - His weight is stable. -We will proceed with cycle 8 today.  We will see him back in 2 weeks for follow-up.  I plan to repeat CT CAP prior to next visit.   2.  Weight loss: -He is taking Megace twice daily.  Drinking Ensure 4 cans/day.  Eating 3 meals per day. -His weight is stable.  3.  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  4.  Anxiety: -He takes Klonopin 1 mg twice daily.   Total time spent is 25 minutes with more than 50% of the time spent face-to-face discussing treatment plan, counseling and coordination of care.  Orders placed this encounter:  Orders Placed This Encounter  Procedures   CT Abdomen Pelvis W Contrast   CT Chest W Contrast      Derek Jack, MD  [place] [phone]

## 2019-02-10 DIAGNOSIS — E119 Type 2 diabetes mellitus without complications: Secondary | ICD-10-CM | POA: Diagnosis not present

## 2019-02-10 DIAGNOSIS — I10 Essential (primary) hypertension: Secondary | ICD-10-CM | POA: Diagnosis not present

## 2019-02-11 ENCOUNTER — Inpatient Hospital Stay (HOSPITAL_COMMUNITY): Payer: Medicare Other

## 2019-02-11 ENCOUNTER — Encounter (HOSPITAL_COMMUNITY): Payer: Self-pay

## 2019-02-11 ENCOUNTER — Other Ambulatory Visit: Payer: Self-pay

## 2019-02-11 VITALS — BP 113/67 | HR 64 | Temp 97.7°F | Resp 16

## 2019-02-11 DIAGNOSIS — Z5112 Encounter for antineoplastic immunotherapy: Secondary | ICD-10-CM | POA: Diagnosis not present

## 2019-02-11 DIAGNOSIS — C7800 Secondary malignant neoplasm of unspecified lung: Secondary | ICD-10-CM | POA: Diagnosis not present

## 2019-02-11 DIAGNOSIS — C20 Malignant neoplasm of rectum: Secondary | ICD-10-CM

## 2019-02-11 DIAGNOSIS — C7889 Secondary malignant neoplasm of other digestive organs: Secondary | ICD-10-CM | POA: Diagnosis not present

## 2019-02-11 DIAGNOSIS — Z5189 Encounter for other specified aftercare: Secondary | ICD-10-CM | POA: Diagnosis not present

## 2019-02-11 DIAGNOSIS — Z5111 Encounter for antineoplastic chemotherapy: Secondary | ICD-10-CM | POA: Diagnosis not present

## 2019-02-11 MED ORDER — SODIUM CHLORIDE 0.9% FLUSH
10.0000 mL | INTRAVENOUS | Status: DC | PRN
  Administered 2019-02-11: 10 mL
  Filled 2019-02-11: qty 10

## 2019-02-11 MED ORDER — HEPARIN SOD (PORK) LOCK FLUSH 100 UNIT/ML IV SOLN
500.0000 [IU] | Freq: Once | INTRAVENOUS | Status: AC | PRN
  Administered 2019-02-11: 500 [IU]

## 2019-02-11 MED ORDER — PEGFILGRASTIM-CBQV 6 MG/0.6ML ~~LOC~~ SOSY
6.0000 mg | PREFILLED_SYRINGE | Freq: Once | SUBCUTANEOUS | Status: AC
  Administered 2019-02-11: 6 mg via SUBCUTANEOUS

## 2019-02-11 NOTE — Progress Notes (Signed)
Nutrition Follow-up:  Patient with stage IV rectal cancer with lung and pancreatic metastatic disease. Ileostomy on 09/29/2018.  Patient receiving chemotherapy at this time.   Met with patient prior to pump removal.  Patient reports that he had to rush wife to hospital last Friday due to vertigo, thought was stroke.  Reports that she is home and doing some better.  Has follow-up doctors appointments later this week.  Reports that he has not been eating as much due to her not feeling well.  Usually go out to eat 2 times per day but has been home eating sandwiches and soups vs full course meals.  Reports that he continues to drink glucerna 4 times per day.    Denies any nutrition impact symptoms at this time  Medications: reviewed  Labs: glucose 188  Anthropometrics:   Weight 173 lb 3.2 oz on 10/7 slight decrease from 177 lb on 9/23.     NUTRITION DIAGNOSIS: Increased nutrient needs continue   INTERVENTION:  Patient prefers chocolate glucerna shakes and was able to provide for patient today. Encouraged patient to continue to eat well to maintain weight.   Active listening as patient discussing wife's recent illness.     MONITORING, EVALUATION, GOAL: Patient will consume adequate calories and protein to maintain weight   NEXT VISIT: October 23 rd  Kanisha Duba B. Zenia Resides, [place], [place] Registered Dietitian [phone] (pager)

## 2019-02-11 NOTE — Progress Notes (Signed)
Charles [name] returns today for port de access and flush after 46 hr continous infusion of 66fu. Tolerated infusion without problems. Portacath located right chest wall was  deaccessed and flushed with 58ml NS and 500U/24ml Heparin and needle removed intact.  Procedure without incident. Patient tolerated procedure well. Vitals stable and discharged home from clinic ambulatory. Follow up as scheduled.

## 2019-02-22 ENCOUNTER — Inpatient Hospital Stay (HOSPITAL_COMMUNITY)
Admission: EM | Admit: 2019-02-22 | Discharge: 2019-02-26 | DRG: 872 | Disposition: A | Discharge status: Home / Self Care | Payer: Medicare Other | Attending: Internal Medicine | Admitting: Internal Medicine

## 2019-02-22 ENCOUNTER — Ambulatory Visit (HOSPITAL_COMMUNITY): Admission: RE | Admit: 2019-02-22 | Payer: Medicare Other | Source: Ambulatory Visit

## 2019-02-22 ENCOUNTER — Encounter (HOSPITAL_COMMUNITY): Payer: Self-pay | Admitting: *Deleted

## 2019-02-22 ENCOUNTER — Emergency Department (HOSPITAL_COMMUNITY): Payer: Medicare Other

## 2019-02-22 ENCOUNTER — Other Ambulatory Visit: Payer: Self-pay

## 2019-02-22 DIAGNOSIS — C19 Malignant neoplasm of rectosigmoid junction: Secondary | ICD-10-CM | POA: Diagnosis present

## 2019-02-22 DIAGNOSIS — Z95828 Presence of other vascular implants and grafts: Secondary | ICD-10-CM

## 2019-02-22 DIAGNOSIS — R1110 Vomiting, unspecified: Secondary | ICD-10-CM | POA: Diagnosis not present

## 2019-02-22 DIAGNOSIS — Z885 Allergy status to narcotic agent status: Secondary | ICD-10-CM

## 2019-02-22 DIAGNOSIS — Z808 Family history of malignant neoplasm of other organs or systems: Secondary | ICD-10-CM

## 2019-02-22 DIAGNOSIS — E1151 Type 2 diabetes mellitus with diabetic peripheral angiopathy without gangrene: Secondary | ICD-10-CM | POA: Diagnosis present

## 2019-02-22 DIAGNOSIS — K3580 Unspecified acute appendicitis: Secondary | ICD-10-CM | POA: Diagnosis not present

## 2019-02-22 DIAGNOSIS — F419 Anxiety disorder, unspecified: Secondary | ICD-10-CM | POA: Diagnosis present

## 2019-02-22 DIAGNOSIS — G8929 Other chronic pain: Secondary | ICD-10-CM | POA: Diagnosis present

## 2019-02-22 DIAGNOSIS — K219 Gastro-esophageal reflux disease without esophagitis: Secondary | ICD-10-CM | POA: Diagnosis present

## 2019-02-22 DIAGNOSIS — Z923 Personal history of irradiation: Secondary | ICD-10-CM

## 2019-02-22 DIAGNOSIS — C7800 Secondary malignant neoplasm of unspecified lung: Secondary | ICD-10-CM | POA: Diagnosis not present

## 2019-02-22 DIAGNOSIS — Z86718 Personal history of other venous thrombosis and embolism: Secondary | ICD-10-CM

## 2019-02-22 DIAGNOSIS — C7889 Secondary malignant neoplasm of other digestive organs: Secondary | ICD-10-CM | POA: Diagnosis present

## 2019-02-22 DIAGNOSIS — R6510 Systemic inflammatory response syndrome (SIRS) of non-infectious origin without acute organ dysfunction: Secondary | ICD-10-CM | POA: Diagnosis not present

## 2019-02-22 DIAGNOSIS — K37 Unspecified appendicitis: Secondary | ICD-10-CM | POA: Diagnosis present

## 2019-02-22 DIAGNOSIS — A419 Sepsis, unspecified organism: Secondary | ICD-10-CM

## 2019-02-22 DIAGNOSIS — Z8042 Family history of malignant neoplasm of prostate: Secondary | ICD-10-CM

## 2019-02-22 DIAGNOSIS — K381 Appendicular concretions: Secondary | ICD-10-CM | POA: Diagnosis not present

## 2019-02-22 DIAGNOSIS — R918 Other nonspecific abnormal finding of lung field: Secondary | ICD-10-CM | POA: Diagnosis not present

## 2019-02-22 DIAGNOSIS — Z79899 Other long term (current) drug therapy: Secondary | ICD-10-CM

## 2019-02-22 DIAGNOSIS — K8689 Other specified diseases of pancreas: Secondary | ICD-10-CM | POA: Diagnosis not present

## 2019-02-22 DIAGNOSIS — Z9221 Personal history of antineoplastic chemotherapy: Secondary | ICD-10-CM

## 2019-02-22 DIAGNOSIS — I10 Essential (primary) hypertension: Secondary | ICD-10-CM | POA: Diagnosis present

## 2019-02-22 DIAGNOSIS — Z20828 Contact with and (suspected) exposure to other viral communicable diseases: Secondary | ICD-10-CM | POA: Diagnosis not present

## 2019-02-22 DIAGNOSIS — C20 Malignant neoplasm of rectum: Secondary | ICD-10-CM | POA: Diagnosis present

## 2019-02-22 DIAGNOSIS — Z79891 Long term (current) use of opiate analgesic: Secondary | ICD-10-CM

## 2019-02-22 DIAGNOSIS — Z87442 Personal history of urinary calculi: Secondary | ICD-10-CM

## 2019-02-22 DIAGNOSIS — Z7901 Long term (current) use of anticoagulants: Secondary | ICD-10-CM

## 2019-02-22 DIAGNOSIS — R197 Diarrhea, unspecified: Secondary | ICD-10-CM | POA: Diagnosis not present

## 2019-02-22 DIAGNOSIS — Z933 Colostomy status: Secondary | ICD-10-CM

## 2019-02-22 DIAGNOSIS — Z86711 Personal history of pulmonary embolism: Secondary | ICD-10-CM

## 2019-02-22 DIAGNOSIS — F329 Major depressive disorder, single episode, unspecified: Secondary | ICD-10-CM | POA: Diagnosis present

## 2019-02-22 LAB — CBC WITH DIFFERENTIAL/PLATELET
Abs Immature Granulocytes: 0.08 10*3/uL — ABNORMAL HIGH (ref 0.00–0.07)
Basophils Absolute: 0 10*3/uL (ref 0.0–0.1)
Basophils Relative: 0 %
Eosinophils Absolute: 0 10*3/uL (ref 0.0–0.5)
Eosinophils Relative: 0 %
HCT: 39.6 % (ref 39.0–52.0)
Hemoglobin: 12.3 g/dL — ABNORMAL LOW (ref 13.0–17.0)
Immature Granulocytes: 1 %
Lymphocytes Relative: 15 %
Lymphs Abs: 1.6 10*3/uL (ref 0.7–4.0)
MCH: 28.9 pg (ref 26.0–34.0)
MCHC: 31.1 g/dL (ref 30.0–36.0)
MCV: 93 fL (ref 80.0–100.0)
Monocytes Absolute: 1 10*3/uL (ref 0.1–1.0)
Monocytes Relative: 10 %
Neutro Abs: 7.7 10*3/uL (ref 1.7–7.7)
Neutrophils Relative %: 74 %
Platelets: 254 10*3/uL (ref 150–400)
RBC: 4.26 MIL/uL (ref 4.22–5.81)
RDW: 14.5 % (ref 11.5–15.5)
WBC: 10.5 10*3/uL (ref 4.0–10.5)
nRBC: 0 % (ref 0.0–0.2)

## 2019-02-22 LAB — COMPREHENSIVE METABOLIC PANEL
ALT: 17 U/L (ref 0–44)
AST: 14 U/L — ABNORMAL LOW (ref 15–41)
Albumin: 2.9 g/dL — ABNORMAL LOW (ref 3.5–5.0)
Alkaline Phosphatase: 103 U/L (ref 38–126)
Anion gap: 9 (ref 5–15)
BUN: 10 mg/dL (ref 8–23)
CO2: 22 mmol/L (ref 22–32)
Calcium: 8 mg/dL — ABNORMAL LOW (ref 8.9–10.3)
Chloride: 101 mmol/L (ref 98–111)
Creatinine, Ser: 0.79 mg/dL (ref 0.61–1.24)
GFR calc Af Amer: >60 mL/min (ref >60)
GFR calc non Af Amer: >60 mL/min (ref >60)
Glucose, Bld: 130 mg/dL — ABNORMAL HIGH (ref 70–99)
Potassium: 3.7 mmol/L (ref 3.5–5.1)
Sodium: 132 mmol/L — ABNORMAL LOW (ref 135–145)
Total Bilirubin: 0.7 mg/dL (ref 0.3–1.2)
Total Protein: 5.9 g/dL — ABNORMAL LOW (ref 6.5–8.1)

## 2019-02-22 LAB — URINALYSIS, ROUTINE W REFLEX MICROSCOPIC
Bacteria, UA: NONE SEEN
Bilirubin Urine: NEGATIVE
Glucose, UA: NEGATIVE mg/dL
Hgb urine dipstick: NEGATIVE
Ketones, ur: NEGATIVE mg/dL
Leukocytes,Ua: NEGATIVE
Nitrite: NEGATIVE
Protein, ur: 30 mg/dL — AB
Specific Gravity, Urine: 1.026 (ref 1.005–1.030)
pH: 6 (ref 5.0–8.0)

## 2019-02-22 LAB — LIPASE, BLOOD: Lipase: 14 U/L (ref 11–51)

## 2019-02-22 LAB — LACTIC ACID, PLASMA
Lactic Acid, Venous: 2.7 mmol/L (ref 0.5–1.9)
Lactic Acid, Venous: 2 mmol/L (ref 0.5–1.9)

## 2019-02-22 MED ORDER — VANCOMYCIN HCL 1.5 G IV SOLR
1500.0000 mg | Freq: Once | INTRAVENOUS | Status: AC
  Administered 2019-02-22: 1500 mg via INTRAVENOUS
  Filled 2019-02-22: qty 1000

## 2019-02-22 MED ORDER — PIPERACILLIN-TAZOBACTAM IN DEX 2-0.25 GM/50ML IV SOLN: 2.2500 g | Freq: Once | INTRAVENOUS | Status: DC

## 2019-02-22 MED ORDER — SODIUM CHLORIDE 0.9 % IV SOLN
2.0000 g | Freq: Once | INTRAVENOUS | Status: AC
  Administered 2019-02-22: 2 g via INTRAVENOUS
  Filled 2019-02-22: qty 2

## 2019-02-22 MED ORDER — VANCOMYCIN HCL 500 MG IV SOLR
INTRAVENOUS | Status: AC
  Filled 2019-02-22: qty 500

## 2019-02-22 MED ORDER — IOHEXOL 300 MG/ML  SOLN
100.0000 mL | Freq: Once | INTRAMUSCULAR | Status: AC | PRN
  Administered 2019-02-22: 100 mL via INTRAVENOUS

## 2019-02-22 MED ORDER — VANCOMYCIN HCL 1.25 G IV SOLR
1250.0000 mg | Freq: Two times a day (BID) | INTRAVENOUS | Status: DC
  Filled 2019-02-22: qty 1250

## 2019-02-22 MED ORDER — PIPERACILLIN-TAZOBACTAM 3.375 G IVPB 30 MIN
3.3750 g | INTRAVENOUS | Status: AC
  Administered 2019-02-23: 3.375 g via INTRAVENOUS
  Filled 2019-02-22: qty 50

## 2019-02-22 MED ORDER — SODIUM CHLORIDE 0.9 % IV SOLN: 2.0000 g | Freq: Three times a day (TID) | INTRAVENOUS | Status: DC

## 2019-02-22 MED ORDER — METRONIDAZOLE IN NACL 5-0.79 MG/ML-% IV SOLN
500.0000 mg | Freq: Once | INTRAVENOUS | Status: AC
  Administered 2019-02-22: 500 mg via INTRAVENOUS
  Filled 2019-02-22: qty 100

## 2019-02-22 MED ORDER — VANCOMYCIN HCL 1000 MG IV SOLR
INTRAVENOUS | Status: AC
  Filled 2019-02-22: qty 1000

## 2019-02-22 MED ORDER — SODIUM CHLORIDE 0.9 % IV BOLUS
1000.0000 mL | Freq: Once | INTRAVENOUS | Status: AC
  Administered 2019-02-22: 1000 mL via INTRAVENOUS

## 2019-02-22 MED ORDER — VANCOMYCIN HCL IN DEXTROSE 1-5 GM/200ML-% IV SOLN: 1000.0000 mg | Freq: Once | INTRAVENOUS | Status: DC

## 2019-02-22 NOTE — Progress Notes (Signed)
Patient's wife called clinic stating that Charles [name] was running a 100.3 temp yesterday, weakness, body aches, increased watery stools from colostomy.  His temp today is 102.1.  Wife advised to bring him in to the ER.  I called Magda Paganini, RN in ER and gave her report.

## 2019-02-22 NOTE — ED Triage Notes (Signed)
Patient presents to the ED following a visit to the cancer center.  Patient has had a fever since yesterday.  Patient has weakness, abdominal cramps and water stools via colostomy bag.  Last chemotherapy treatment was 2 weeks ago.  Patient had a fever today of 102.1 and took tylenol at 1600 today.

## 2019-02-22 NOTE — ED Notes (Signed)
Date and time results received: 02/22/19 1918 (use smartphrase ".now" to insert current time)  Test: lactic acid Critical Value: 2.7  Name of Provider Notified: [place], [place]  Orders Received? Or Actions Taken?: Actions Taken: no orders received

## 2019-02-22 NOTE — Progress Notes (Signed)
Pharmacy Antibiotic Note  Charles [name] is a 68 y.o. male with B-cell lymphoma admitted on 02/22/2019 with febrile neutropenia.  Pharmacy has been consulted for vancomycin and cefepime dosing.  Plan:  Cefepime 2g IV q8h  Vancomycin 1500mg  IV x 1, then 1250mg  IV q12h for estimated AUC 509  Check vancomycin trough if continues > 72hr, sooner if renal function warrants  Follow up renal function & cultures  Height: 6\' 2"  (188 cm) Weight: 173 lb (78.5 kg) IBW/kg (Calculated) : 82.2  Temp (24hrs), Avg:98.8 F (37.1 C), Min:98.8 F (37.1 C), Max:98.8 F (37.1 C)  Recent Labs  Lab 02/22/19 1749  WBC 10.5  CREATININE 0.79  LATICACIDVEN 2.7*    Estimated Creatinine Clearance: 98.1 mL/min (by C-G formula based on SCr of 0.79 mg/dL).    Allergies  Allergen Reactions  . Oxycodone     Blisters, hallucinations  . Tramadol     Blisters, hallucinations  . Trazodone And Nefazodone Other (See Comments)    hallucinations    Antimicrobials this admission: 10/20 Vancomycin >> 10/20 Cefepime >> 10/20 Flagyl  Dose adjustments this admission:  Microbiology results: 10/20 BCx: 10/20 UCx: 10/20 C.diff: 10/20 GI panel: 10/20 SARS CoV-2:  Thank you for allowing pharmacy to be a part of this patient's care.  Peggyann Juba, PharmD, BCPS 02/22/2019 7:28 PM

## 2019-02-23 ENCOUNTER — Ambulatory Visit (HOSPITAL_COMMUNITY): Payer: Medicare Other

## 2019-02-23 ENCOUNTER — Ambulatory Visit (HOSPITAL_COMMUNITY): Payer: Medicare Other | Admitting: Hematology

## 2019-02-23 ENCOUNTER — Encounter (HOSPITAL_COMMUNITY): Payer: Self-pay

## 2019-02-23 ENCOUNTER — Other Ambulatory Visit (HOSPITAL_COMMUNITY): Payer: Medicare Other

## 2019-02-23 DIAGNOSIS — Z808 Family history of malignant neoplasm of other organs or systems: Secondary | ICD-10-CM | POA: Diagnosis not present

## 2019-02-23 DIAGNOSIS — C20 Malignant neoplasm of rectum: Secondary | ICD-10-CM

## 2019-02-23 DIAGNOSIS — C7889 Secondary malignant neoplasm of other digestive organs: Secondary | ICD-10-CM | POA: Diagnosis present

## 2019-02-23 DIAGNOSIS — C19 Malignant neoplasm of rectosigmoid junction: Secondary | ICD-10-CM

## 2019-02-23 DIAGNOSIS — Z923 Personal history of irradiation: Secondary | ICD-10-CM | POA: Diagnosis not present

## 2019-02-23 DIAGNOSIS — Z9221 Personal history of antineoplastic chemotherapy: Secondary | ICD-10-CM | POA: Diagnosis not present

## 2019-02-23 DIAGNOSIS — Z8042 Family history of malignant neoplasm of prostate: Secondary | ICD-10-CM | POA: Diagnosis not present

## 2019-02-23 DIAGNOSIS — Z7901 Long term (current) use of anticoagulants: Secondary | ICD-10-CM | POA: Diagnosis not present

## 2019-02-23 DIAGNOSIS — F419 Anxiety disorder, unspecified: Secondary | ICD-10-CM | POA: Diagnosis present

## 2019-02-23 DIAGNOSIS — G8929 Other chronic pain: Secondary | ICD-10-CM | POA: Diagnosis present

## 2019-02-23 DIAGNOSIS — Z87442 Personal history of urinary calculi: Secondary | ICD-10-CM | POA: Diagnosis not present

## 2019-02-23 DIAGNOSIS — K3580 Unspecified acute appendicitis: Secondary | ICD-10-CM

## 2019-02-23 DIAGNOSIS — Z86711 Personal history of pulmonary embolism: Secondary | ICD-10-CM | POA: Diagnosis not present

## 2019-02-23 DIAGNOSIS — Z86718 Personal history of other venous thrombosis and embolism: Secondary | ICD-10-CM | POA: Diagnosis not present

## 2019-02-23 DIAGNOSIS — Z885 Allergy status to narcotic agent status: Secondary | ICD-10-CM | POA: Diagnosis not present

## 2019-02-23 DIAGNOSIS — R6510 Systemic inflammatory response syndrome (SIRS) of non-infectious origin without acute organ dysfunction: Secondary | ICD-10-CM | POA: Diagnosis not present

## 2019-02-23 DIAGNOSIS — Z79899 Other long term (current) drug therapy: Secondary | ICD-10-CM | POA: Diagnosis not present

## 2019-02-23 DIAGNOSIS — C7800 Secondary malignant neoplasm of unspecified lung: Secondary | ICD-10-CM | POA: Diagnosis present

## 2019-02-23 DIAGNOSIS — Z79891 Long term (current) use of opiate analgesic: Secondary | ICD-10-CM | POA: Diagnosis not present

## 2019-02-23 DIAGNOSIS — E1151 Type 2 diabetes mellitus with diabetic peripheral angiopathy without gangrene: Secondary | ICD-10-CM | POA: Diagnosis present

## 2019-02-23 DIAGNOSIS — F329 Major depressive disorder, single episode, unspecified: Secondary | ICD-10-CM | POA: Diagnosis present

## 2019-02-23 DIAGNOSIS — I10 Essential (primary) hypertension: Secondary | ICD-10-CM | POA: Diagnosis present

## 2019-02-23 DIAGNOSIS — Z95828 Presence of other vascular implants and grafts: Secondary | ICD-10-CM | POA: Diagnosis not present

## 2019-02-23 DIAGNOSIS — A419 Sepsis, unspecified organism: Principal | ICD-10-CM

## 2019-02-23 DIAGNOSIS — K219 Gastro-esophageal reflux disease without esophagitis: Secondary | ICD-10-CM | POA: Diagnosis present

## 2019-02-23 DIAGNOSIS — Z20828 Contact with and (suspected) exposure to other viral communicable diseases: Secondary | ICD-10-CM | POA: Diagnosis present

## 2019-02-23 DIAGNOSIS — K37 Unspecified appendicitis: Secondary | ICD-10-CM | POA: Diagnosis present

## 2019-02-23 LAB — CBC
HCT: 35.8 % — ABNORMAL LOW (ref 39.0–52.0)
Hemoglobin: 11.2 g/dL — ABNORMAL LOW (ref 13.0–17.0)
MCH: 29.2 pg (ref 26.0–34.0)
MCHC: 31.3 g/dL (ref 30.0–36.0)
MCV: 93.2 fL (ref 80.0–100.0)
Platelets: 231 10*3/uL (ref 150–400)
RBC: 3.84 MIL/uL — ABNORMAL LOW (ref 4.22–5.81)
RDW: 14.4 % (ref 11.5–15.5)
WBC: 12.3 10*3/uL — ABNORMAL HIGH (ref 4.0–10.5)
nRBC: 0 % (ref 0.0–0.2)

## 2019-02-23 LAB — COMPREHENSIVE METABOLIC PANEL
ALT: 15 U/L (ref 0–44)
AST: 13 U/L — ABNORMAL LOW (ref 15–41)
Albumin: 2.5 g/dL — ABNORMAL LOW (ref 3.5–5.0)
Alkaline Phosphatase: 95 U/L (ref 38–126)
Anion gap: 8 (ref 5–15)
BUN: 8 mg/dL (ref 8–23)
CO2: 23 mmol/L (ref 22–32)
Calcium: 7.9 mg/dL — ABNORMAL LOW (ref 8.9–10.3)
Chloride: 106 mmol/L (ref 98–111)
Creatinine, Ser: 0.63 mg/dL (ref 0.61–1.24)
GFR calc Af Amer: >60 mL/min (ref >60)
GFR calc non Af Amer: >60 mL/min (ref >60)
Glucose, Bld: 93 mg/dL (ref 70–99)
Potassium: 3.8 mmol/L (ref 3.5–5.1)
Sodium: 137 mmol/L (ref 135–145)
Total Bilirubin: 0.8 mg/dL (ref 0.3–1.2)
Total Protein: 5.2 g/dL — ABNORMAL LOW (ref 6.5–8.1)

## 2019-02-23 LAB — HEMOGLOBIN A1C
Hgb A1c MFr Bld: 7.1 % — ABNORMAL HIGH (ref 4.8–5.6)
Mean Plasma Glucose: 157.07 mg/dL

## 2019-02-23 LAB — GLUCOSE, CAPILLARY
Glucose-Capillary: 111 mg/dL — ABNORMAL HIGH (ref 70–99)
Glucose-Capillary: 173 mg/dL — ABNORMAL HIGH (ref 70–99)

## 2019-02-23 LAB — APTT: aPTT: 51 seconds — ABNORMAL HIGH (ref 24–36)

## 2019-02-23 LAB — SARS CORONAVIRUS 2 BY RT PCR (HOSPITAL ORDER, PERFORMED IN ~~LOC~~ HOSPITAL LAB): SARS Coronavirus 2: NEGATIVE

## 2019-02-23 LAB — CBG MONITORING, ED: Glucose-Capillary: 81 mg/dL (ref 70–99)

## 2019-02-23 LAB — HEPARIN LEVEL (UNFRACTIONATED): Heparin Unfractionated: <0.1 IU/mL — ABNORMAL LOW (ref 0.30–0.70)

## 2019-02-23 MED ORDER — GABAPENTIN 300 MG PO CAPS
900.0000 mg | ORAL_CAPSULE | Freq: Two times a day (BID) | ORAL | Status: DC
  Administered 2019-02-23 – 2019-02-26 (×7): 900 mg via ORAL
  Filled 2019-02-23 (×7): qty 3

## 2019-02-23 MED ORDER — HEPARIN (PORCINE) 25000 UT/250ML-% IV SOLN
1150.0000 [IU]/h | INTRAVENOUS | Status: DC
  Administered 2019-02-23 – 2019-02-24 (×2): 1300 [IU]/h via INTRAVENOUS
  Filled 2019-02-23 (×2): qty 250

## 2019-02-23 MED ORDER — SODIUM CHLORIDE 0.9 % IV SOLN
INTRAVENOUS | Status: DC
  Administered 2019-02-23 – 2019-02-25 (×3): via INTRAVENOUS

## 2019-02-23 MED ORDER — INSULIN ASPART 100 UNIT/ML ~~LOC~~ SOLN
0.0000 [IU] | Freq: Three times a day (TID) | SUBCUTANEOUS | Status: DC
  Administered 2019-02-24 (×2): 1 [IU] via SUBCUTANEOUS
  Administered 2019-02-24 – 2019-02-25 (×2): 2 [IU] via SUBCUTANEOUS
  Administered 2019-02-25: 1 [IU] via SUBCUTANEOUS
  Administered 2019-02-25: 2 [IU] via SUBCUTANEOUS
  Administered 2019-02-26 (×2): 1 [IU] via SUBCUTANEOUS

## 2019-02-23 MED ORDER — ONDANSETRON HCL 4 MG/2ML IJ SOLN: 4.0000 mg | Freq: Four times a day (QID) | INTRAMUSCULAR | Status: DC | PRN

## 2019-02-23 MED ORDER — VANCOMYCIN HCL 1.25 G IV SOLR
1250.0000 mg | Freq: Two times a day (BID) | INTRAVENOUS | Status: DC
  Administered 2019-02-23: 1250 mg via INTRAVENOUS
  Filled 2019-02-23 (×4): qty 1250

## 2019-02-23 MED ORDER — GLUCERNA SHAKE PO LIQD
237.0000 mL | Freq: Three times a day (TID) | ORAL | Status: DC
  Administered 2019-02-23 – 2019-02-25 (×6): 237 mL via ORAL

## 2019-02-23 MED ORDER — CLONAZEPAM 0.5 MG PO TABS: 1.0000 mg | ORAL_TABLET | Freq: Three times a day (TID) | ORAL | Status: DC | PRN

## 2019-02-23 MED ORDER — ACETAMINOPHEN 650 MG RE SUPP: 650.0000 mg | Freq: Four times a day (QID) | RECTAL | Status: DC | PRN

## 2019-02-23 MED ORDER — ONDANSETRON HCL 4 MG PO TABS: 4.0000 mg | ORAL_TABLET | Freq: Four times a day (QID) | ORAL | Status: DC | PRN

## 2019-02-23 MED ORDER — PIPERACILLIN-TAZOBACTAM 3.375 G IVPB
3.3750 g | Freq: Three times a day (TID) | INTRAVENOUS | Status: DC
  Administered 2019-02-23 – 2019-02-26 (×9): 3.375 g via INTRAVENOUS
  Filled 2019-02-23 (×9): qty 50

## 2019-02-23 MED ORDER — MIRTAZAPINE 15 MG PO TABS
7.5000 mg | ORAL_TABLET | Freq: Every day | ORAL | Status: DC
  Administered 2019-02-23 – 2019-02-25 (×3): 7.5 mg via ORAL
  Filled 2019-02-23 (×3): qty 1

## 2019-02-23 MED ORDER — ACETAMINOPHEN 325 MG PO TABS: 650.0000 mg | ORAL_TABLET | Freq: Four times a day (QID) | ORAL | Status: DC | PRN

## 2019-02-23 NOTE — Plan of Care (Signed)

## 2019-02-23 NOTE — ED Notes (Signed)
Pt was given breakfast tray 

## 2019-02-23 NOTE — Consult Note (Signed)
[facility] Consult  Reason for Consult: Acute appendicitis  Referring Physician:  Dr. Carles Collet, MD  Chief Complaint    Fever      HPI: Charles STEGMAN is a 68 y.o. male presenting with acute appendicitis in the setting of metastatic rectal cancer to the pancreas and lung receiving chemotherapy.  He also has a history of DM, DVT/ PE on Xarelto, who has undergone proctectomy with colorectal anastomosis and diverting loop ileostomy at [facility] in 2012 with Dr. Harlon Ditty followed by ileostomy takedown. He had 3/20 lymph nodes positive, and underwent treatment.  He later developed pulmonary metastatic disease s/p resection, and has a anastomotic stricture requiring diverting loop transverse colostomy 09/2018 with Dr. Arnoldo Morale for palliation.  The patient says he was doing well until he started to develop some right sided abdominal pain and nausea. He also reported a low grade fever to 1002.  He reported nothing made the pain better.This promoted him to come to the ED, and he was evaluated.  He had a CT that demonstrated appendicitis with a dilated tip of the appendix and an appendicolith, and significant stranding and inflammation around the tip of the appendix.  He last took his Xarelto 10/19 per his wife's report.  He received chemotherapy 2 weeks ago.  Past Medical History:  Diagnosis Date  . Chronic anticoagulation   . Colon cancer ([place]) 07/24/2010   rectal ca, inv adenocarcinoma  . Depression 04/01/2011  . Diabetes mellitus without complication ([place])   . DVT (deep venous thrombosis) ([place]) 05/09/2011  . GERD (gastroesophageal reflux disease)   . High output ileostomy ([place]) 05/21/2011  . History of kidney stones   . HTN (hypertension)   . Hx of radiation therapy 09/02/10 to 10/14/10   pelvis  . Lung metastasis ([place])   . Neuropathy   . Peripheral vascular disease ([place])    dvt's,pe  . Pneumonia    hx x3  . Pulmonary embolism ([place])   . Rectal cancer ([place]) 01/15/2011    S/P radiation and concurrent 5-FU continuous infusion from 09/09/10- 10/10/10.  S/P proctectomy with colorectal anastomosis and diverting loop ileostomy on 11/14/10 at [facility] by Dr. Harlon Ditty. Pathology reveals a pT3b N1 with 3/20 lymph nodes.      Past Surgical History:  Procedure Laterality Date  . BILIARY STENT PLACEMENT N/A 09/16/2018   Procedure: STENT PLACEMENT;  Surgeon: Rogene Houston, MD;  [facility];  Service: Endoscopy;  Laterality: N/A;  . BIOPSY  09/24/2018   Procedure: BIOPSY;  Surgeon: Danie Binder, MD;  [facility];  Service: Endoscopy;;  . COLON SURGERY  11/14/2010   proctectomy with colorectal anastomosis and diverting loop ileostomy (temporary planned)  . COLONOSCOPY  07/2010   proximal rectal apple core mass 10-14cm from anal verge (adenocarcinoma), 2-3cm distal rectal carpet polyp s/p piecemeal snare polypectomy (adenoma)  . COLONOSCOPY  04/21/2012   RMR: Friable,fibrotic appearing colorectal anastomosis producing some luminal narrowing-not felt to be critical. path: focal erosion with slight inflammation and hyperemia. SURVEILLANCE DUE DEC 2015  . COLONOSCOPY N/A 11/24/2013   Dr. Rourk:somewhat fibrotic/friable anastomotic mucosal-status post biopsy (narrowing not felt to be clinically significant) Single colonic diverticulum. benign polypoid rectal mucosa  . colostomy reversal  april 2013  . ERCP N/A 09/16/2018   Procedure: ENDOSCOPIC RETROGRADE CHOLANGIOPANCREATOGRAPHY (ERCP);  Surgeon: Rogene Houston, MD;  [facility];  Service: Endoscopy;  Laterality: N/A;  . ESOPHAGOGASTRODUODENOSCOPY  07/2010   RMR: schatki ring  s/p dilation, small hh, SB bx benign  . ESOPHAGOGASTRODUODENOSCOPY N/A 09/04/2015   Procedure: ESOPHAGOGASTRODUODENOSCOPY (EGD);  Surgeon: Daneil Dolin, MD;  [facility];  Service: Endoscopy;  Laterality: N/A;  730  . EUS  08/2010   Dr. Owens Loffler. uT3N0 circumferential, nearly obstruction rectosigmoid  adenocarcinoma, distal edge 12cm from anal verge  . EUS N/A 06/19/2016   Procedure: UPPER ENDOSCOPIC ULTRASOUND (EUS) RADIAL;  Surgeon: Milus Banister, MD;  [facility];  Service: Endoscopy;  Laterality: N/A;  . FLEXIBLE SIGMOIDOSCOPY N/A 09/24/2018   Procedure: FLEXIBLE SIGMOIDOSCOPY;  Surgeon: Danie Binder, MD;  [facility];  Service: Endoscopy;  Laterality: N/A;  . HERNIA REPAIR     abd hernia repair  . IVC filter    . ivc filter    . port a cath placement    . PORT-A-CATH REMOVAL  09/24/2011   Procedure: REMOVAL PORT-A-CATH;  Surgeon: Donato Heinz, MD;  Location: [facility];  Service: General;  Laterality: N/A;  Minor Room  . PORTACATH PLACEMENT Right 07/07/2016   Procedure: INSERTION PORT-A-CATH;  Surgeon: Aviva Signs, MD;  Location: [facility];  Service: General;  Laterality: Right;  . SPHINCTEROTOMY N/A 09/16/2018   Procedure: SPHINCTEROTOMY with balloon dialation;  Surgeon: Rogene Houston, MD;  [facility];  Service: Endoscopy;  Laterality: N/A;  . TRANSVERSE LOOP COLOSTOMY N/A 09/29/2018   Procedure: TRANSVERSE LOOP COLOSTOMY;  Surgeon: Aviva Signs, MD;  Location: [facility];  Service: General;  Laterality: N/A;  . VIDEO ASSISTED THORACOSCOPY (VATS)/WEDGE RESECTION Right 11/15/2014   Procedure: VIDEO ASSISTED THORACOSCOPY (VATS)/LUNG RESECTION WITH RIGHT LINGULECTOMY;  Surgeon: [name], MD;  Location: [place];  Service: Thoracic;  Laterality: Right;  Marland Kitchen VIDEO BRONCHOSCOPY N/A 11/15/2014   Procedure: VIDEO BRONCHOSCOPY;  Surgeon: [name], MD;  Location[facility] OR;  Service: Thoracic;  Laterality: N/A;    Family History  Problem Relation Age of Onset  . Cancer Brother        throat  . Cancer Brother        prostate  . Colon cancer Neg Hx   . Liver disease Neg Hx   . Inflammatory bowel disease Neg Hx     Social History   Tobacco Use  . Smoking status: Never Smoker  . Smokeless tobacco: Never Used  Substance Use Topics  . Alcohol  use: No  . Drug use: No    Medications:  I have reviewed the patient's current medications. Prior to Admission:  Medications Prior to Admission  Medication Sig Dispense Refill Last Dose  . acetaminophen (TYLENOL) 500 MG tablet Take 1,000 mg by mouth every 6 (six) hours as needed for mild pain or moderate pain.    02/22/2019 at Unknown time  . clonazePAM (KLONOPIN) 1 MG tablet Take 1 tablet by mouth three times daily as needed (Patient taking differently: Take 1 mg by mouth 3 (three) times daily. ) 90 tablet 0 02/22/2019 at Unknown time  . Diphenhyd-Hydrocort-Nystatin (FIRST-DUKES MOUTHWASH) SUSP Use as directed 5 mLs in the mouth or throat 4 (four) times daily. 360 mL 3 02/21/2019 at Unknown time  . diphenoxylate-atropine (LOMOTIL) 2.5-0.025 MG tablet Take 2 tablets by mouth 3 (three) times daily as needed for diarrhea or loose stools. 180 tablet 3 Past Week at Unknown time  . dronabinol (MARINOL) 5 MG capsule Take 1 capsule (5 mg total) by mouth 2 (two) times daily before a meal. 30 capsule 3 02/22/2019 at Unknown time  .  gabapentin (NEURONTIN) 300 MG capsule TAKE 3 CAPSULES BY MOUTH TWICE DAILY (Patient taking differently: Take 900 mg by mouth 2 (two) times daily. ) 540 capsule 0 02/22/2019 at Unknown time  . glimepiride (AMARYL) 2 MG tablet Take 2 mg by mouth 2 (two) times daily before a meal.    02/22/2019 at Unknown time  . mirtazapine (REMERON) 7.5 MG tablet Take 7.5 mg by mouth at bedtime.    02/21/2019 at Unknown time  . ondansetron (ZOFRAN) 8 MG tablet Take 1 tablet (8 mg total) by mouth 2 (two) times daily as needed for refractory nausea / vomiting. Start on day 3 after chemotherapy. 30 tablet 1 02/21/2019 at Unknown time  . oxyCODONE-acetaminophen (PERCOCET) 10-325 MG tablet Take 1 tablet by mouth every 6 (six) hours as needed for pain. 12 tablet 0 unknown  . pantoprazole (PROTONIX) 40 MG tablet Take 40 mg by mouth daily.    02/22/2019 at Unknown time  . Probiotic Product (PROBIOTIC  DAILY PO) Take 1 capsule by mouth daily.   02/22/2019 at Unknown time  . rivaroxaban (XARELTO) 20 MG TABS tablet Take 1 tablet (20 mg total) by mouth daily with supper. 30 tablet 3 02/21/2019 at 1700  . vitamin B-12 (CYANOCOBALAMIN) 1000 MCG tablet Take 1,000 mcg by mouth daily.   02/22/2019 at Unknown time   Scheduled: . gabapentin  900 mg Oral BID  . insulin aspart  0-9 Units Subcutaneous TID WC  . mirtazapine  7.5 mg Oral QHS   Continuous: . sodium chloride 100 mL/hr at 02/23/19 0354  . heparin    . piperacillin-tazobactam (ZOSYN)  IV 3.375 g (02/23/19 1218)  . vancomycin (VANCOCIN) 1250 mg/287mL IVPB (Vial-Mate [employer]) 1,250 mg (02/23/19 1603)   FOY:DXAJOINOMVEHM **OR** acetaminophen, clonazePAM, ondansetron **OR** ondansetron (ZOFRAN) IV  Allergies  Allergen Reactions  . Oxycodone     Blisters, hallucinations  . Tramadol     Blisters, hallucinations  . Trazodone And Nefazodone Other (See Comments)    hallucinations     ROS:  A comprehensive review of systems was negative except for: Constitutional: positive for fevers Gastrointestinal: positive for abdominal pain and nausea    Blood pressure 121/71, pulse 94, temperature 100.3 F (37.9 C), temperature source Oral, resp. rate 17, height 6\' 2"  (1.88 m), weight 78.5 kg, SpO2 95 %. Physical Exam Vitals signs reviewed.  Constitutional:      Appearance: Normal appearance.  HENT:     Head: Normocephalic.     Nose: Nose normal.     Mouth/Throat:     Mouth: Mucous membranes are moist.  Eyes:     Extraocular Movements: Extraocular movements intact.     Pupils: Pupils are equal, round, and reactive to light.  Neck:     Musculoskeletal: Normal range of motion.  Cardiovascular:     Rate and Rhythm: Normal rate and regular rhythm.  Pulmonary:     Effort: Pulmonary effort is normal. No respiratory distress.     Breath sounds: Normal breath sounds.  Abdominal:     General: There is distension.     Palpations: Abdomen is  soft.     Tenderness: There is abdominal tenderness in the right lower quadrant. There is no guarding or rebound.     Comments: Colostomy in the left upper quadrant with stool  Musculoskeletal: Normal range of motion.  Skin:    General: Skin is warm and dry.  Neurological:     General: No focal deficit present.     Mental Status: He is  alert and oriented to person, place, and time.  Psychiatric:        Mood and Affect: Mood normal.        Behavior: Behavior normal.        Thought Content: Thought content normal.        Judgment: Judgment normal.     Results: Results for orders placed or performed during the hospital encounter of 02/22/19 (from the past 48 hour(s))  CBC with Differential     Status: Abnormal   Collection Time: 02/22/19  5:49 PM  Result Value Ref Range   WBC 10.5 4.0 - 10.5 K/uL   RBC 4.26 4.22 - 5.81 MIL/uL   Hemoglobin 12.3 (L) 13.0 - 17.0 g/dL   HCT 39.6 39.0 - 52.0 %   MCV 93.0 80.0 - 100.0 fL   MCH 28.9 26.0 - 34.0 pg   MCHC 31.1 30.0 - 36.0 g/dL   RDW 14.5 11.5 - 15.5 %   Platelets 254 150 - 400 K/uL   nRBC 0.0 0.0 - 0.2 %   Neutrophils Relative % 74 %   Neutro Abs 7.7 1.7 - 7.7 K/uL   Lymphocytes Relative 15 %   Lymphs Abs 1.6 0.7 - 4.0 K/uL   Monocytes Relative 10 %   Monocytes Absolute 1.0 0.1 - 1.0 K/uL   Eosinophils Relative 0 %   Eosinophils Absolute 0.0 0.0 - 0.5 K/uL   Basophils Relative 0 %   Basophils Absolute 0.0 0.0 - 0.1 K/uL   Immature Granulocytes 1 %   Abs Immature Granulocytes 0.08 (H) 0.00 - 0.07 K/uL    Comment: Performed at [facility], [address], [place], [place] [address]  Comprehensive metabolic panel     Status: Abnormal   Collection Time: 02/22/19  5:49 PM  Result Value Ref Range   Sodium 132 (L) 135 - 145 mmol/L   Potassium 3.7 3.5 - 5.1 mmol/L   Chloride 101 98 - 111 mmol/L   CO2 22 22 - 32 mmol/L   Glucose, Bld 130 (H) 70 - 99 mg/dL   BUN 10 8 - 23 mg/dL   Creatinine, Ser 0.79 0.61 - 1.24 mg/dL   Calcium 8.0  (L) 8.9 - 10.3 mg/dL   Total Protein 5.9 (L) 6.5 - 8.1 g/dL   Albumin 2.9 (L) 3.5 - 5.0 g/dL   AST 14 (L) 15 - 41 U/L   ALT 17 0 - 44 U/L   Alkaline Phosphatase 103 38 - 126 U/L   Total Bilirubin 0.7 0.3 - 1.2 mg/dL   GFR calc non Af Amer >60 >60 mL/min   GFR calc Af Amer >60 >60 mL/min   Anion gap 9 5 - 15    Comment: Performed at [facility], [address] [place], [place] [address]  Lipase, blood     Status: None   Collection Time: 02/22/19  5:49 PM  Result Value Ref Range   Lipase 14 11 - 51 U/L    Comment: Performed at [facility], [address] [place], [place] [address]  Lactic acid, plasma     Status: Abnormal   Collection Time: 02/22/19  5:49 PM  Result Value Ref Range   Lactic Acid, Venous 2.7 (HH) 0.5 - 1.9 mmol/L    Comment: CRITICAL RESULT CALLED TO, READ BACK BY AND VERIFIED WITH: BELTON,C ON 02/22/19 AT 1915 BY LOY,C Performed at [facility], [address], [place], [place] [address]   Blood culture (routine x 2)     Status:  None (Preliminary result)   Collection Time: 02/22/19  6:51 PM   Specimen: BLOOD LEFT WRIST  Result Value Ref Range   Specimen Description BLOOD LEFT WRIST    Special Requests      BOTTLES DRAWN AEROBIC AND ANAEROBIC Blood Culture adequate volume Performed at [facility], [address], [place], [place] [address]    Culture PENDING    Report Status PENDING   Blood culture (routine x 2)     Status: None (Preliminary result)   Collection Time: 02/22/19  6:52 PM   Specimen: Right Antecubital; Blood  Result Value Ref Range   Specimen Description RIGHT ANTECUBITAL    Special Requests      BOTTLES DRAWN AEROBIC AND ANAEROBIC Blood Culture adequate volume Performed at [facility], [address], [place], [place] [address]    Culture PENDING    Report Status PENDING   Lactic acid, plasma     Status: Abnormal   Collection Time: 02/22/19  8:03 PM  Result Value Ref Range   Lactic Acid, Venous 2.0 (HH) 0.5 - 1.9 mmol/L    Comment:  CRITICAL VALUE NOTED.  VALUE IS CONSISTENT WITH PREVIOUSLY REPORTED AND CALLED VALUE. Performed at [facility], [address], [place], [place] [address]   Urinalysis, Routine w reflex microscopic     Status: Abnormal   Collection Time: 02/22/19  8:13 PM  Result Value Ref Range   Color, Urine AMBER (A) YELLOW    Comment: BIOCHEMICALS MAY BE AFFECTED BY COLOR   APPearance CLEAR CLEAR   Specific Gravity, Urine 1.026 1.005 - 1.030   pH 6.0 5.0 - 8.0   Glucose, UA NEGATIVE NEGATIVE mg/dL   Hgb urine dipstick NEGATIVE NEGATIVE   Bilirubin Urine NEGATIVE NEGATIVE   Ketones, ur NEGATIVE NEGATIVE mg/dL   Protein, ur 30 (A) NEGATIVE mg/dL   Nitrite NEGATIVE NEGATIVE   Leukocytes,Ua NEGATIVE NEGATIVE   RBC / HPF 0-5 0 - 5 RBC/hpf   WBC, UA 0-5 0 - 5 WBC/hpf   Bacteria, UA NONE SEEN NONE SEEN   Mucus PRESENT     Comment: Performed at [facility], [address] [place], [place] [address]  SARS Coronavirus 2 by RT PCR (hospital order, performed in [place] hospital lab) Nasopharyngeal Nasopharyngeal Swab     Status: None   Collection Time: 02/22/19 10:22 PM   Specimen: Nasopharyngeal Swab  Result Value Ref Range   SARS Coronavirus 2 NEGATIVE NEGATIVE    Comment: (NOTE) If result is NEGATIVE SARS-CoV-2 target nucleic acids are NOT DETECTED. The SARS-CoV-2 RNA is generally detectable in upper and lower  respiratory specimens during the acute phase of infection. The lowest  concentration of SARS-CoV-2 viral copies this assay can detect is 250  copies / mL. A negative result does not preclude SARS-CoV-2 infection  and should not be used as the sole basis for treatment or other  patient management decisions.  A negative result may occur with  improper specimen collection / handling, submission of specimen other  than nasopharyngeal swab, presence of viral mutation(s) within the  areas targeted by this assay, and inadequate number of viral copies  (<250 copies / mL). A negative result must  be combined with clinical  observations, patient history, and epidemiological information. If result is POSITIVE SARS-CoV-2 target nucleic acids are DETECTED. The SARS-CoV-2 RNA is generally detectable in upper and lower  respiratory specimens dur ing the acute phase of infection.  Positive  results are indicative of active infection with SARS-CoV-2.  Clinical  correlation with patient history and other diagnostic information is  necessary to determine patient infection status.  Positive results do  not rule out bacterial infection or co-infection with other viruses. If result is PRESUMPTIVE POSTIVE SARS-CoV-2 nucleic acids MAY BE PRESENT.   A presumptive positive result was obtained on the submitted specimen  and confirmed on repeat testing.  While 2019 novel coronavirus  (SARS-CoV-2) nucleic acids may be present in the submitted sample  additional confirmatory testing may be necessary for epidemiological  and / or clinical management purposes  to differentiate between  SARS-CoV-2 and other Sarbecovirus currently known to infect humans.  If clinically indicated additional testing with an alternate test  methodology [phone]) is advised. The SARS-CoV-2 RNA is generally  detectable in upper and lower respiratory sp ecimens during the acute  phase of infection. The expected result is Negative. Fact Sheet for Patients:  StrictlyIdeas.no Fact Sheet for Healthcare Providers: BankingDealers.co.za This test is not yet approved or cleared by the [place] FDA and has been authorized for detection and/or diagnosis of SARS-CoV-2 by FDA under an Emergency Use Authorization (EUA).  This EUA will remain in effect (meaning this test can be used) for the duration of the COVID-19 declaration under Section 564(b)(1) of the Act, 21 U.S.C. section 360bbb-3(b)(1), unless the authorization is terminated or revoked sooner. Performed at [facility], [address] [place], [place] [address]   CBC     Status: Abnormal   Collection Time: 02/23/19  4:04 AM  Result Value Ref Range   WBC 12.3 (H) 4.0 - 10.5 K/uL   RBC 3.84 (L) 4.22 - 5.81 MIL/uL   Hemoglobin 11.2 (L) 13.0 - 17.0 g/dL   HCT 35.8 (L) 39.0 - 52.0 %   MCV 93.2 80.0 - 100.0 fL   MCH 29.2 26.0 - 34.0 pg   MCHC 31.3 30.0 - 36.0 g/dL   RDW 14.4 11.5 - 15.5 %   Platelets 231 150 - 400 K/uL   nRBC 0.0 0.0 - 0.2 %    Comment: Performed at [facility], [address] [place], [place] [address]  Comprehensive metabolic panel     Status: Abnormal   Collection Time: 02/23/19  4:04 AM  Result Value Ref Range   Sodium 137 135 - 145 mmol/L   Potassium 3.8 3.5 - 5.1 mmol/L   Chloride 106 98 - 111 mmol/L   CO2 23 22 - 32 mmol/L   Glucose, Bld 93 70 - 99 mg/dL   BUN 8 8 - 23 mg/dL   Creatinine, Ser 0.63 0.61 - 1.24 mg/dL   Calcium 7.9 (L) 8.9 - 10.3 mg/dL   Total Protein 5.2 (L) 6.5 - 8.1 g/dL   Albumin 2.5 (L) 3.5 - 5.0 g/dL   AST 13 (L) 15 - 41 U/L   ALT 15 0 - 44 U/L   Alkaline Phosphatase 95 38 - 126 U/L   Total Bilirubin 0.8 0.3 - 1.2 mg/dL   GFR calc non Af Amer >60 >60 mL/min   GFR calc Af Amer >60 >60 mL/min   Anion gap 8 5 - 15    Comment: Performed at [facility], [address], [place], [place] [address]   *Note: Due to a large number of results and/or encounters for the requested time period, some results have not been displayed. A complete set of results can be found in Results Review.   Personally reviewed CT- thickened appendix tip, almost phelgmon in nature, significant stranding, possible rupture with  81mm area at the tip  Ct Chest W Contrast  Result Date: 02/22/2019 CLINICAL DATA:  Nausea vomiting diarrhea history of rectal cancer with metastatic disease EXAM: CT CHEST, ABDOMEN, AND PELVIS WITH CONTRAST TECHNIQUE: Multidetector CT imaging of the chest, abdomen and pelvis was performed following the standard protocol during bolus administration of  intravenous contrast. CONTRAST:  156mL OMNIPAQUE IOHEXOL 300 MG/ML  SOLN COMPARISON:  CT 09/22/2018, 07/30/2018, 05/28/2018 FINDINGS: CT CHEST FINDINGS Cardiovascular: Nonaneurysmal aorta. Mild aortic atherosclerosis. Coronary vascular calcification. Normal heart size. No pericardial effusion. Right-sided central venous port with tip at the distal SVC. Mediastinum/Nodes: Midline trachea. No thyroid mass. No increasing mediastinal adenopathy. Esophagus within normal limits. Lungs/Pleura: No acute consolidation or pleural effusion. Stable 6 mm anterior right upper lobe pulmonary nodule, series 3, image number 106, previously 6 mm. Posterior right upper lobe pulmonary nodule, series 3, image number 41 measures 11 mm, previously 11 mm. Posterior right upper lobe 11 mm pulmonary nodule, series 3, image number 75, previously 13 mm. No new pulmonary nodule. Right middle lobe postsurgical changes Musculoskeletal: Stable mixed sclerotic and lucent lesion in the lower sternum. Degenerative changes of the spine. CT ABDOMEN PELVIS FINDINGS Hepatobiliary: Contracted gallbladder without calcified stone. No focal hepatic abnormality. No ductal dilatation Pancreas: Diffusely atrophic. Diffuse pancreatic duct dilatation with truncation at the pancreatic head. Stable 2.1 cm mass at the head of the pancreas. Spleen: Normal in size without focal abnormality. Adrenals/Urinary Tract: Left adrenal calcification. No adrenal mass. Kidneys show no hydronephrosis. Stable subcentimeter exophytic slightly dense lesion off the lower pole left kidney. The bladder is slightly thick walled. Stomach/Bowel: The stomach is nonenlarged. No dilated small bowel. Status post partial proctocolectomy decreased rectal wall thickening compared to most recent prior. Interval left lower quadrant loop colostomy. Abnormal appendix. Small appendicoliths. Considerable periappendiceal inflammatory change. Appendix tip dilated up to 2.1 cm. No extraluminal gas.  Possible tiny 7 mm fluid collection adjacent to tip of appendix. Vascular/Lymphatic: IVC filter with tip just below the renal vein confluence. Similar protrusion struts beyond the IVC lumen. Nonaneurysmal aorta with mild aortic atherosclerosis. Subcentimeter lymph nodes in the right lower quadrant could be reactive. Reproductive: Prostate calcification Other: Mild presacral soft tissue stranding. No free fluid. No significant pelvic effusion Musculoskeletal: No acute or suspicious osseous abnormality IMPRESSION: 1. Findings consistent with acute appendicitis. No definitive extraluminal gas though considerable inflammatory change about the appendix and possible tiny 7 mm tiny fluid collection adjacent to the tip of the appendix. Appendix: Location: Right lower quadrant Diameter: Up to 21 mm Appendicolith: Positive for stone near the tip Mucosal hyper-enhancement: Negative Extraluminal gas: Negative Periappendiceal collection: Possible tiny 7 mm fluid collection adjacent to the tip of the appendix. 2. Status post partial proctocolectomy with interval left lower quadrant loop colostomy. Decreased rectal wall thickening. 3.  Stable right upper lobe pulmonary nodules. 4. Stable 2 cm mass at the head of pancreas with atrophy ductal obstruction distal to this 5. Stable mixed lucent and sclerotic lesion involving the lower sternum Electronically Signed   By: Donavan Foil M.D.   On: 02/22/2019 21:40   Ct Abdomen Pelvis W Contrast  Result Date: 02/22/2019 CLINICAL DATA:  Nausea vomiting diarrhea history of rectal cancer with metastatic disease EXAM: CT CHEST, ABDOMEN, AND PELVIS WITH CONTRAST TECHNIQUE: Multidetector CT imaging of the chest, abdomen and pelvis was performed following the standard protocol during bolus administration of intravenous contrast. CONTRAST:  122mL OMNIPAQUE IOHEXOL 300 MG/ML  SOLN COMPARISON:  CT 09/22/2018, 07/30/2018, 05/28/2018 FINDINGS: CT CHEST  FINDINGS Cardiovascular: Nonaneurysmal  aorta. Mild aortic atherosclerosis. Coronary vascular calcification. Normal heart size. No pericardial effusion. Right-sided central venous port with tip at the distal SVC. Mediastinum/Nodes: Midline trachea. No thyroid mass. No increasing mediastinal adenopathy. Esophagus within normal limits. Lungs/Pleura: No acute consolidation or pleural effusion. Stable 6 mm anterior right upper lobe pulmonary nodule, series 3, image number 106, previously 6 mm. Posterior right upper lobe pulmonary nodule, series 3, image number 41 measures 11 mm, previously 11 mm. Posterior right upper lobe 11 mm pulmonary nodule, series 3, image number 75, previously 13 mm. No new pulmonary nodule. Right middle lobe postsurgical changes Musculoskeletal: Stable mixed sclerotic and lucent lesion in the lower sternum. Degenerative changes of the spine. CT ABDOMEN PELVIS FINDINGS Hepatobiliary: Contracted gallbladder without calcified stone. No focal hepatic abnormality. No ductal dilatation Pancreas: Diffusely atrophic. Diffuse pancreatic duct dilatation with truncation at the pancreatic head. Stable 2.1 cm mass at the head of the pancreas. Spleen: Normal in size without focal abnormality. Adrenals/Urinary Tract: Left adrenal calcification. No adrenal mass. Kidneys show no hydronephrosis. Stable subcentimeter exophytic slightly dense lesion off the lower pole left kidney. The bladder is slightly thick walled. Stomach/Bowel: The stomach is nonenlarged. No dilated small bowel. Status post partial proctocolectomy decreased rectal wall thickening compared to most recent prior. Interval left lower quadrant loop colostomy. Abnormal appendix. Small appendicoliths. Considerable periappendiceal inflammatory change. Appendix tip dilated up to 2.1 cm. No extraluminal gas. Possible tiny 7 mm fluid collection adjacent to tip of appendix. Vascular/Lymphatic: IVC filter with tip just below the renal vein confluence. Similar protrusion struts beyond the IVC  lumen. Nonaneurysmal aorta with mild aortic atherosclerosis. Subcentimeter lymph nodes in the right lower quadrant could be reactive. Reproductive: Prostate calcification Other: Mild presacral soft tissue stranding. No free fluid. No significant pelvic effusion Musculoskeletal: No acute or suspicious osseous abnormality IMPRESSION: 1. Findings consistent with acute appendicitis. No definitive extraluminal gas though considerable inflammatory change about the appendix and possible tiny 7 mm tiny fluid collection adjacent to the tip of the appendix. Appendix: Location: Right lower quadrant Diameter: Up to 21 mm Appendicolith: Positive for stone near the tip Mucosal hyper-enhancement: Negative Extraluminal gas: Negative Periappendiceal collection: Possible tiny 7 mm fluid collection adjacent to the tip of the appendix. 2. Status post partial proctocolectomy with interval left lower quadrant loop colostomy. Decreased rectal wall thickening. 3.  Stable right upper lobe pulmonary nodules. 4. Stable 2 cm mass at the head of pancreas with atrophy ductal obstruction distal to this 5. Stable mixed lucent and sclerotic lesion involving the lower sternum Electronically Signed   By: Donavan Foil M.D.   On: 02/22/2019 21:40     Assessment & Plan:  WASIM HURLBUT is a 68 y.o. male with acute appendicitis with concern for possible rupture/ phlegmon formation given the appearance on CT. In addition, he has a significant surgical history with multiple surgeries including an ileostomy takedown that can cause scarring and likely require an open appendectomy. He has metastatic cancer, and is getting chemotherapy.  He is not a good operative candidate.    -Non operative management of the appendicitis with IV zosyn, if Vanc on for appendicitis this can be stopped -Full liquid diet given for now, encouraged patient to go slow, he is worried about losing weight while in hospital, have placed order for glucerna for  supplementation  -Would hold Xarelto pending need for any IR drainage or surgery, heparin gtt for PE/ DVT  Discussed case with Dr. Arnoldo Morale and he agrees  risk of surgery, open surgery and complication from surgery including needing ileocecectomy, anastomotic issues, healing and bleeding issues related to chemotherapy, it is safer to treat with antibiotics. Appendicolith is present and normally we would try to perform an appendectomy in the future, but given this specific situation it may be safer to monitor.   All questions were answered to the satisfaction of the patient and family.  Discussed with Dr. Carles Collet.   Virl Cagey 02/23/2019, 7:24 AM

## 2019-02-23 NOTE — Progress Notes (Signed)
ANTICOAGULATION CONSULT NOTE - Initial Consult  Pharmacy Consult for heparin infusion Indication: pulmonary embolus  Allergies  Allergen Reactions  . Oxycodone     Blisters, hallucinations  . Tramadol     Blisters, hallucinations  . Trazodone And Nefazodone Other (See Comments)    hallucinations    Patient Measurements: Height: 6\' 2"  (188 cm) Weight: 184 lb 8.4 oz (83.7 kg) IBW/kg (Calculated) : 82.2 Heparin Dosing Weight: HEPARIN DW (KG): 83.7   Vital Signs: Temp: 100.4 F (38 C) (10/21 1439) Temp Source: Oral (10/21 1439) BP: 116/80 (10/21 1439) Pulse Rate: 83 (10/21 1439)  Labs: Recent Labs    02/22/19 1749 02/23/19 0404  HGB 12.3* 11.2*  HCT 39.6 35.8*  PLT 254 231  CREATININE 0.79 0.63    Estimated Creatinine Clearance: 102.8 mL/min (by C-G formula based on SCr of 0.63 mg/dL).   Medical History: Past Medical History:  Diagnosis Date  . Chronic anticoagulation   . Colon cancer ([place]) 07/24/2010   rectal ca, inv adenocarcinoma  . Depression 04/01/2011  . Diabetes mellitus without complication ([place])   . DVT (deep venous thrombosis) ([place]) 05/09/2011  . GERD (gastroesophageal reflux disease)   . High output ileostomy ([place]) 05/21/2011  . History of kidney stones   . HTN (hypertension)   . Hx of radiation therapy 09/02/10 to 10/14/10   pelvis  . Lung metastasis ([place])   . Neuropathy   . Peripheral vascular disease ([place])    dvt's,pe  . Pneumonia    hx x3  . Pulmonary embolism ([place])   . Rectal cancer ([place]) 01/15/2011   S/P radiation and concurrent 5-FU continuous infusion from 09/09/10- 10/10/10.  S/P proctectomy with colorectal anastomosis and diverting loop ileostomy on 11/14/10 at [facility] by Dr. Harlon Ditty. Pathology reveals a pT3b N1 with 3/20 lymph nodes.       Assessment:   Pharmacy consulted to dose heparin infusion for this 68 yo male for acute PE. He was taking rivaroxaban 20mg  daily at home PTA, with his last dose on 02-21-19.  Baseline CBC is WNL.  Will titrate heparin based on aPTT levels until they correlate with anti-Xa levels until the effects of rivaroxaban are diminished.   Goal of Therapy:  Heparin level 0.3-0.7 units/ml Monitor platelets by anticoagulation protocol: Yes   Plan:  Start heparin infusion at 1300 units/hr Check aPTT in 6-8 hours and daily until it correlates with anti-Xa level Continue to monitor H&H and platelets    Despina Pole, Pharm. D. Clinical Pharmacist 02/23/2019 4:05 PM

## 2019-02-23 NOTE — ED Provider Notes (Addendum)
[facility] EMERGENCY DEPARTMENT Provider Note   CSN: [id number] Arrival date & time: 02/22/19  1640     History   Chief Complaint Chief Complaint  Patient presents with  . Fever    HPI Charles [name] is a 68 y.o. male presenting for evaluation of fever and abdominal discomfort.  Patient states yesterday he started to develop some right sided abdominal soreness.  It is intermittent.  He developed a fever of 100.2 last night, fever resolved with Tylenol.  He was afebrile until 4:00 this afternoon, when he became acutely febrile at 102.  He took Tylenol for this prior to coming to the ED.  He has associated nausea.  He reports watery loose stools coming from his ileostomy over the past 2 days.  This is abnormal for him.  No blood in the stool.  He denies chest pain, shortness of breath, vomiting, or urinary symptoms.  He is currently being treated for pancreatic and lung cancer.  Last chemotherapy was 11 days ago.  Wife states patient has been complaining of body aches over the past 2 days.  Patient states he thinks his symptoms are due to allergies and having worked outside 5 days ago, although his symptoms did not begin until yesterday.  Additional history obtained from chart review.  Patient with a history of colon and rectal cancer status post resection and in remission.  History of diabetes, DVT/PE on anticoagulation, GERD, hypertension, lung cancer, pancreatic cancer.        HPI  Past Medical History:  Diagnosis Date  . Chronic anticoagulation   . Colon cancer ([place]) 07/24/2010   rectal ca, inv adenocarcinoma  . Depression 04/01/2011  . Diabetes mellitus without complication ([place])   . DVT (deep venous thrombosis) ([place]) 05/09/2011  . GERD (gastroesophageal reflux disease)   . High output ileostomy ([place]) 05/21/2011  . History of kidney stones   . HTN (hypertension)   . Hx of radiation therapy 09/02/10 to 10/14/10   pelvis  . Lung metastasis ([place])   . Neuropathy   .  Peripheral vascular disease ([place])    dvt's,pe  . Pneumonia    hx x3  . Pulmonary embolism ([place])   . Rectal cancer ([place]) 01/15/2011   S/P radiation and concurrent 5-FU continuous infusion from 09/09/10- 10/10/10.  S/P proctectomy with colorectal anastomosis and diverting loop ileostomy on 11/14/10 at [facility] by Dr. Harlon Ditty. Pathology reveals a pT3b N1 with 3/20 lymph nodes.      Patient Active Problem List   Diagnosis Date Noted  . Nausea, vomiting and diarrhea   . SIRS (systemic inflammatory response syndrome) ([place]) 10/16/2018  . Metastatic colorectal cancer ([place])   . Anastomotic stricture of colorectal region   . Abnormal CT scan, colon   . Diarrhea in adult patient 09/22/2018  . History of pulmonary embolism 09/22/2018  . Hypokalemia 09/22/2018  . Type 2 diabetes mellitus ([place]) 09/22/2018  . Hypomagnesemia 09/22/2018  . Dilated gallbladder 09/22/2018  . Goals of care, counseling/discussion 09/20/2018  . Fecal incontinence 09/23/2017  . Community acquired pneumonia of right lower lobe of lung 07/23/2016  . Abnormal pancreas function test   . Rectal pain 05/30/2016  . Hiatal hernia   . Schatzki's ring   . GI bleed 11/23/2014  . Gastritis 11/23/2014  . S/P thoracotomy 11/23/2014  . S/P partial lobectomy of lung 11/15/2014  . DVT (deep venous thrombosis) ([place])   . Lung nodule   . Lung nodule, solitary   . Acute pulmonary  embolism ([place]) 09/08/2014  . Lactic acidosis 09/08/2014  . Lower abdominal pain 09/08/2014  . Urinary retention 09/08/2014  . Diarrhea 08/01/2014  . 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 Stiffness of joint, not elsewhere classified, ankle and foot 01/31/2014  . Weakness of both legs 01/31/2014  . Hx of radiation therapy   . Dehydration 03/10/2011  . Rectal cancer ([place]) 01/15/2011  . Colon cancer ([place]) 07/24/2010  . GERD 07/11/2010    Past Surgical History:  Procedure Laterality Date  . BILIARY STENT PLACEMENT N/A  09/16/2018   Procedure: STENT PLACEMENT;  Surgeon: Rogene Houston, MD;  Location: [facility];  Service: Endoscopy;  Laterality: N/A;  . BIOPSY  09/24/2018   Procedure: BIOPSY;  Surgeon: Danie Binder, MD;  Location: [facility];  Service: Endoscopy;;  . COLON SURGERY  11/14/2010   proctectomy with colorectal anastomosis and diverting loop ileostomy (temporary planned)  . COLONOSCOPY  07/2010   proximal rectal apple core mass 10-14cm from anal verge (adenocarcinoma), 2-3cm distal rectal carpet polyp s/p piecemeal snare polypectomy (adenoma)  . COLONOSCOPY  04/21/2012   RMR: Friable,fibrotic appearing colorectal anastomosis producing some luminal narrowing-not felt to be critical. path: focal erosion with slight inflammation and hyperemia. SURVEILLANCE DUE DEC 2015  . COLONOSCOPY N/A 11/24/2013   Dr. Rourk:somewhat fibrotic/friable anastomotic mucosal-status post biopsy (narrowing not felt to be clinically significant) Single colonic diverticulum. benign polypoid rectal mucosa  . colostomy reversal  april 2013  . ERCP N/A 09/16/2018   Procedure: ENDOSCOPIC RETROGRADE CHOLANGIOPANCREATOGRAPHY (ERCP);  Surgeon: Rogene Houston, MD;  Location: [facility];  Service: Endoscopy;  Laterality: N/A;  . ESOPHAGOGASTRODUODENOSCOPY  07/2010   RMR: schatki ring s/p dilation, small hh, SB bx benign  . ESOPHAGOGASTRODUODENOSCOPY N/A 09/04/2015   Procedure: ESOPHAGOGASTRODUODENOSCOPY (EGD);  Surgeon: Daneil Dolin, MD;  Location: [facility];  Service: Endoscopy;  Laterality: N/A;  730  . EUS  08/2010   Dr. Owens Loffler. uT3N0 circumferential, nearly obstruction rectosigmoid adenocarcinoma, distal edge 12cm from anal verge  . EUS N/A 06/19/2016   Procedure: UPPER ENDOSCOPIC ULTRASOUND (EUS) RADIAL;  Surgeon: Milus Banister, MD;  Location: [facility] END[facility];  Service: Endoscopy;  Laterality: N/A;  . FLEXIBLE SIGMOIDOSCOPY N/A 09/24/2018   Procedure: FLEXIBLE SIGMOIDOSCOPY;  Surgeon: Danie Binder, MD;   Location: [facility];  Service: Endoscopy;  Laterality: N/A;  . HERNIA REPAIR     abd hernia repair  . IVC filter    . ivc filter    . port a cath placement    . PORT-A-CATH REMOVAL  09/24/2011   Procedure: REMOVAL PORT-A-CATH;  Surgeon: Donato Heinz, MD;  Location: [facility] ORS;  Service: General;  Laterality: N/A;  Minor Room  . PORTACATH PLACEMENT Right 07/07/2016   Procedure: INSERTION PORT-A-CATH;  Surgeon: Aviva Signs, MD;  Location: [facility] ORS;  Service: General;  Laterality: Right;  . SPHINCTEROTOMY N/A 09/16/2018   Procedure: SPHINCTEROTOMY with balloon dialation;  Surgeon: Rogene Houston, MD;  [facility];  Service: Endoscopy;  Laterality: N/A;  . TRANSVERSE LOOP COLOSTOMY N/A 09/29/2018   Procedure: TRANSVERSE LOOP COLOSTOMY;  Surgeon: Aviva Signs, MD;  Location: [facility];  Service: General;  Laterality: N/A;  . VIDEO ASSISTED THORACOSCOPY (VATS)/WEDGE RESECTION Right 11/15/2014   Procedure: VIDEO ASSISTED THORACOSCOPY (VATS)/LUNG RESECTION WITH RIGHT LINGULECTOMY;  Surgeon: [name], MD;  Location: [place];  Service: Thoracic;  Laterality: Right;  Marland Kitchen VIDEO BRONCHOSCOPY N/A 11/15/2014   Procedure: VIDEO BRONCHOSCOPY;  Surgeon:  [name], MD;  Location: [place];  Service: Thoracic;  Laterality: N/A;        Home Medications    Prior to Admission medications   Medication Sig Start Date End Date Taking? Authorizing Provider  acetaminophen (TYLENOL) 500 MG tablet Take 1,000 mg by mouth every 6 (six) hours as needed for mild pain or moderate pain.    Yes [provider]  clonazePAM (KLONOPIN) 1 MG tablet Take 1 tablet by mouth three times daily as needed Patient taking differently: Take 1 mg by mouth 3 (three) times daily.  02/07/19  Yes Derek Jack, MD  Diphenhyd-Hydrocort-Nystatin (FIRST-DUKES MOUTHWASH) SUSP Use as directed 5 mLs in the mouth or throat 4 (four) times daily. 10/07/18  Yes Derek Jack, MD  diphenoxylate-atropine (LOMOTIL)  2.5-0.025 MG tablet Take 2 tablets by mouth 3 (three) times daily as needed for diarrhea or loose stools. 01/26/19  Yes Derek Jack, MD  dronabinol (MARINOL) 5 MG capsule Take 1 capsule (5 mg total) by mouth 2 (two) times daily before a meal. 10/18/18  Yes Derek Jack, MD  gabapentin (NEURONTIN) 300 MG capsule TAKE 3 CAPSULES BY MOUTH TWICE DAILY Patient taking differently: Take 900 mg by mouth 2 (two) times daily.  01/27/19  Yes Derek Jack, MD  glimepiride (AMARYL) 2 MG tablet Take 2 mg by mouth 2 (two) times daily before a meal.    Yes [provider]  mirtazapine (REMERON) 7.5 MG tablet Take 7.5 mg by mouth at bedtime.  11/10/18  Yes [provider]  ondansetron (ZOFRAN) 8 MG tablet Take 1 tablet (8 mg total) by mouth 2 (two) times daily as needed for refractory nausea / vomiting. Start on day 3 after chemotherapy. 10/01/18  Yes Emokpae, Courage, MD  oxyCODONE-acetaminophen (PERCOCET) 10-325 MG tablet Take 1 tablet by mouth every 6 (six) hours as needed for pain. 10/01/18  Yes Emokpae, Courage, MD  pantoprazole (PROTONIX) 40 MG tablet Take 40 mg by mouth daily.  12/13/18  Yes [provider]  Probiotic Product (PROBIOTIC DAILY PO) Take 1 capsule by mouth daily.   Yes [provider]  rivaroxaban (XARELTO) 20 MG TABS tablet Take 1 tablet (20 mg total) by mouth daily with supper. 09/19/18  Yes Rehman, Mechele Dawley, MD  vitamin B-12 (CYANOCOBALAMIN) 1000 MCG tablet Take 1,000 mcg by mouth daily.   Yes [provider]    Family History Family History  Problem Relation Age of Onset  . Cancer Brother        throat  . Cancer Brother        prostate  . Colon cancer Neg Hx   . Liver disease Neg Hx   . Inflammatory bowel disease Neg Hx     Social History Social History   Tobacco Use  . Smoking status: Never Smoker  . Smokeless tobacco: Never Used  Substance Use Topics  . Alcohol use: No  . Drug use: No     Allergies    Oxycodone, Tramadol, and Trazodone and nefazodone   Review of Systems Review of Systems  Constitutional: Positive for fever.  Gastrointestinal: Positive for abdominal pain and nausea.  Musculoskeletal: Positive for myalgias.  All other systems reviewed and are negative.    Physical Exam Updated Vital Signs BP 110/67   Pulse 86   Temp 100.3 F (37.9 C) (Oral)   Resp 19   Ht 6\' 2"  (1.88 m)   Wt 78.5 kg   SpO2 99%   BMI 22.21 kg/m  Physical Exam Vitals signs and nursing note reviewed.  Constitutional:      Appearance: He is well-developed.     Comments: Chronically ill appearing  HENT:     Head: Normocephalic and atraumatic.  Eyes:     Conjunctiva/sclera: Conjunctivae normal.     Pupils: Pupils are equal, round, and reactive to light.  Neck:     Musculoskeletal: Normal range of motion and neck supple.  Cardiovascular:     Rate and Rhythm: Normal rate and regular rhythm.     Pulses: Normal pulses.  Pulmonary:     Effort: Pulmonary effort is normal. No respiratory distress.     Breath sounds: Normal breath sounds. No wheezing.  Abdominal:     General: There is no distension.     Palpations: Abdomen is soft.     Tenderness: There is abdominal tenderness.     Comments: Ileostomy in place with thin liquidy yellow output.  No blood. Tenderness tp palpation of right sided abdomen.  No rigidity, guarding, distention.  Negative rebound.  Musculoskeletal: Normal range of motion.  Skin:    General: Skin is warm and dry.     Capillary Refill: Capillary refill takes less than 2 seconds.  Neurological:     Mental Status: He is alert and oriented to person, place, and time.      ED Treatments / Results  Labs (all labs ordered are listed, but only abnormal results are displayed) Labs Reviewed  CBC WITH DIFFERENTIAL/PLATELET - Abnormal; Notable for the following components:      Result Value   Hemoglobin 12.3 (*)    Abs Immature Granulocytes 0.08 (*)    All other  components within normal limits  COMPREHENSIVE METABOLIC PANEL - Abnormal; Notable for the following components:   Sodium 132 (*)    Glucose, Bld 130 (*)    Calcium 8.0 (*)    Total Protein 5.9 (*)    Albumin 2.9 (*)    AST 14 (*)    All other components within normal limits  URINALYSIS, ROUTINE W REFLEX MICROSCOPIC - Abnormal; Notable for the following components:   Color, Urine AMBER (*)    Protein, ur 30 (*)    All other components within normal limits  LACTIC ACID, PLASMA - Abnormal; Notable for the following components:   Lactic Acid, Venous 2.7 (*)    All other components within normal limits  LACTIC ACID, PLASMA - Abnormal; Notable for the following components:   Lactic Acid, Venous 2.0 (*)    All other components within normal limits  CULTURE, BLOOD (ROUTINE X 2)  CULTURE, BLOOD (ROUTINE X 2)  SARS CORONAVIRUS 2 BY RT PCR (HOSPITAL ORDER, [facility])  URINE CULTURE  C DIFFICILE QUICK SCREEN W PCR REFLEX  GI PATHOGEN PANEL BY PCR, STOOL  LIPASE, BLOOD    EKG None  Radiology Ct Chest W Contrast  Result Date: 02/22/2019 CLINICAL DATA:  Nausea vomiting diarrhea history of rectal cancer with metastatic disease EXAM: CT CHEST, ABDOMEN, AND PELVIS WITH CONTRAST TECHNIQUE: Multidetector CT imaging of the chest, abdomen and pelvis was performed following the standard protocol during bolus administration of intravenous contrast. CONTRAST:  123mL OMNIPAQUE IOHEXOL 300 MG/ML  SOLN COMPARISON:  CT 09/22/2018, 07/30/2018, 05/28/2018 FINDINGS: CT CHEST FINDINGS Cardiovascular: Nonaneurysmal aorta. Mild aortic atherosclerosis. Coronary vascular calcification. Normal heart size. No pericardial effusion. Right-sided central venous port with tip at the distal SVC. Mediastinum/Nodes: Midline trachea. No thyroid mass. No increasing mediastinal adenopathy. Esophagus within normal  limits. Lungs/Pleura: No acute consolidation or pleural effusion. Stable 6 mm anterior  right upper lobe pulmonary nodule, series 3, image number 106, previously 6 mm. Posterior right upper lobe pulmonary nodule, series 3, image number 41 measures 11 mm, previously 11 mm. Posterior right upper lobe 11 mm pulmonary nodule, series 3, image number 75, previously 13 mm. No new pulmonary nodule. Right middle lobe postsurgical changes Musculoskeletal: Stable mixed sclerotic and lucent lesion in the lower sternum. Degenerative changes of the spine. CT ABDOMEN PELVIS FINDINGS Hepatobiliary: Contracted gallbladder without calcified stone. No focal hepatic abnormality. No ductal dilatation Pancreas: Diffusely atrophic. Diffuse pancreatic duct dilatation with truncation at the pancreatic head. Stable 2.1 cm mass at the head of the pancreas. Spleen: Normal in size without focal abnormality. Adrenals/Urinary Tract: Left adrenal calcification. No adrenal mass. Kidneys show no hydronephrosis. Stable subcentimeter exophytic slightly dense lesion off the lower pole left kidney. The bladder is slightly thick walled. Stomach/Bowel: The stomach is nonenlarged. No dilated small bowel. Status post partial proctocolectomy decreased rectal wall thickening compared to most recent prior. Interval left lower quadrant loop colostomy. Abnormal appendix. Small appendicoliths. Considerable periappendiceal inflammatory change. Appendix tip dilated up to 2.1 cm. No extraluminal gas. Possible tiny 7 mm fluid collection adjacent to tip of appendix. Vascular/Lymphatic: IVC filter with tip just below the renal vein confluence. Similar protrusion struts beyond the IVC lumen. Nonaneurysmal aorta with mild aortic atherosclerosis. Subcentimeter lymph nodes in the right lower quadrant could be reactive. Reproductive: Prostate calcification Other: Mild presacral soft tissue stranding. No free fluid. No significant pelvic effusion Musculoskeletal: No acute or suspicious osseous abnormality IMPRESSION: 1. Findings consistent with acute  appendicitis. No definitive extraluminal gas though considerable inflammatory change about the appendix and possible tiny 7 mm tiny fluid collection adjacent to the tip of the appendix. Appendix: Location: Right lower quadrant Diameter: Up to 21 mm Appendicolith: Positive for stone near the tip Mucosal hyper-enhancement: Negative Extraluminal gas: Negative Periappendiceal collection: Possible tiny 7 mm fluid collection adjacent to the tip of the appendix. 2. Status post partial proctocolectomy with interval left lower quadrant loop colostomy. Decreased rectal wall thickening. 3.  Stable right upper lobe pulmonary nodules. 4. Stable 2 cm mass at the head of pancreas with atrophy ductal obstruction distal to this 5. Stable mixed lucent and sclerotic lesion involving the lower sternum Electronically Signed   By: Donavan Foil M.D.   On: 02/22/2019 21:40   Ct Abdomen Pelvis W Contrast  Result Date: 02/22/2019 CLINICAL DATA:  Nausea vomiting diarrhea history of rectal cancer with metastatic disease EXAM: CT CHEST, ABDOMEN, AND PELVIS WITH CONTRAST TECHNIQUE: Multidetector CT imaging of the chest, abdomen and pelvis was performed following the standard protocol during bolus administration of intravenous contrast. CONTRAST:  137mL OMNIPAQUE IOHEXOL 300 MG/ML  SOLN COMPARISON:  CT 09/22/2018, 07/30/2018, 05/28/2018 FINDINGS: CT CHEST FINDINGS Cardiovascular: Nonaneurysmal aorta. Mild aortic atherosclerosis. Coronary vascular calcification. Normal heart size. No pericardial effusion. Right-sided central venous port with tip at the distal SVC. Mediastinum/Nodes: Midline trachea. No thyroid mass. No increasing mediastinal adenopathy. Esophagus within normal limits. Lungs/Pleura: No acute consolidation or pleural effusion. Stable 6 mm anterior right upper lobe pulmonary nodule, series 3, image number 106, previously 6 mm. Posterior right upper lobe pulmonary nodule, series 3, image number 41 measures 11 mm, previously 11  mm. Posterior right upper lobe 11 mm pulmonary nodule, series 3, image number 75, previously 13 mm. No new pulmonary nodule. Right middle lobe postsurgical changes Musculoskeletal: Stable mixed sclerotic and lucent lesion  in the lower sternum. Degenerative changes of the spine. CT ABDOMEN PELVIS FINDINGS Hepatobiliary: Contracted gallbladder without calcified stone. No focal hepatic abnormality. No ductal dilatation Pancreas: Diffusely atrophic. Diffuse pancreatic duct dilatation with truncation at the pancreatic head. Stable 2.1 cm mass at the head of the pancreas. Spleen: Normal in size without focal abnormality. Adrenals/Urinary Tract: Left adrenal calcification. No adrenal mass. Kidneys show no hydronephrosis. Stable subcentimeter exophytic slightly dense lesion off the lower pole left kidney. The bladder is slightly thick walled. Stomach/Bowel: The stomach is nonenlarged. No dilated small bowel. Status post partial proctocolectomy decreased rectal wall thickening compared to most recent prior. Interval left lower quadrant loop colostomy. Abnormal appendix. Small appendicoliths. Considerable periappendiceal inflammatory change. Appendix tip dilated up to 2.1 cm. No extraluminal gas. Possible tiny 7 mm fluid collection adjacent to tip of appendix. Vascular/Lymphatic: IVC filter with tip just below the renal vein confluence. Similar protrusion struts beyond the IVC lumen. Nonaneurysmal aorta with mild aortic atherosclerosis. Subcentimeter lymph nodes in the right lower quadrant could be reactive. Reproductive: Prostate calcification Other: Mild presacral soft tissue stranding. No free fluid. No significant pelvic effusion Musculoskeletal: No acute or suspicious osseous abnormality IMPRESSION: 1. Findings consistent with acute appendicitis. No definitive extraluminal gas though considerable inflammatory change about the appendix and possible tiny 7 mm tiny fluid collection adjacent to the tip of the appendix.  Appendix: Location: Right lower quadrant Diameter: Up to 21 mm Appendicolith: Positive for stone near the tip Mucosal hyper-enhancement: Negative Extraluminal gas: Negative Periappendiceal collection: Possible tiny 7 mm fluid collection adjacent to the tip of the appendix. 2. Status post partial proctocolectomy with interval left lower quadrant loop colostomy. Decreased rectal wall thickening. 3.  Stable right upper lobe pulmonary nodules. 4. Stable 2 cm mass at the head of pancreas with atrophy ductal obstruction distal to this 5. Stable mixed lucent and sclerotic lesion involving the lower sternum Electronically Signed   By: Donavan Foil M.D.   On: 02/22/2019 21:40    Procedures .Critical Care Performed by: Franchot Heidelberg, PA-C Authorized by: Franchot Heidelberg, PA-C   Critical care provider statement:    Critical care time (minutes):  45   Critical care time was exclusive of:  Separately billable procedures and treating other patients and teaching time   Critical care was necessary to treat or prevent imminent or life-threatening deterioration of the following conditions:  Sepsis   Critical care was time spent personally by me on the following activities:  Blood draw for specimens, development of treatment plan with patient or surrogate, discussions with consultants, evaluation of patient's response to treatment, examination of patient, obtaining history from patient or surrogate, ordering and performing treatments and interventions, ordering and review of laboratory studies, ordering and review of radiographic studies, pulse oximetry, re-evaluation of patient's condition and review of old charts   I assumed direction of critical care for this patient from another provider in my specialty: no   Comments:     Pt presenting with SIRIS criteria. IV abx and fluids started.    (including critical care time)  Medications Ordered in ED Medications  ceFEPIme (MAXIPIME) 2 g in sodium chloride 0.9 %  100 mL IVPB (has no administration in time range)  vancomycin (VANCOCIN) 1,250 mg in sodium chloride 0.9 % 250 mL IVPB (has no administration in time range)  piperacillin-tazobactam (ZOSYN) IVPB 3.375 g (has no administration in time range)  ceFEPIme (MAXIPIME) 2 g in sodium chloride 0.9 % 100 mL IVPB (0 g Intravenous Stopped 02/22/19  1946)  metroNIDAZOLE (FLAGYL) IVPB 500 mg (0 mg Intravenous Stopped 02/22/19 2013)  sodium chloride 0.9 % bolus 1,000 mL (0 mLs Intravenous Stopped 02/22/19 2116)  vancomycin (VANCOCIN) 1,500 mg in sodium chloride 0.9 % 500 mL IVPB (0 mg Intravenous Stopped 02/22/19 2238)  iohexol (OMNIPAQUE) 300 MG/ML solution 100 mL (100 mLs Intravenous Contrast Given 02/22/19 2055)  vancomycin (VANCOCIN) 500 MG powder (has no administration in time range)  vancomycin (VANCOCIN) 1000 MG powder (has no administration in time range)     Initial Impression / Assessment and Plan / ED Course  I have reviewed the triage vital signs and the nursing notes.  Pertinent labs & imaging results that were available during my care of the patient were reviewed by me and considered in my medical decision making (see chart for details).        Patient presented for evaluation of fever, abdominal pain, and body aches.  Physical exam shows patient appears chronically ill.  On exam, he does have tenderness to the right sided abdomen, and having abnormal stool output from ileostomy.  Fever of 102 at home, patient is tachycardic in the ED.  While he does not meet sirs criteria in the ED upon arrival, he likely will need to be admitted for infection/sepsis.  Unknown source antibiotics started via sepsis order set, fluid bolus given.  Will obtain labs including lactic acid and blood cultures.  CT chest abdomen pelvis for further evaluation.  Labs show normal white count and stable electrolytes.  Lipase negative.  Lactic elevated at 2.7, as such patient meets her criteria and code sepsis called.    Sepsis reassessment performed. HR and lactic improving.   CT of the abdomen shows appendicitis.  CT otherwise without progressing cancer. Will call general surgery.   Discussed with Dr. Constance Haw drom general surgery, who requests hostpialist admission. Requests pt receive rapid covid test, stay NPO. Requests pt get zosyn tomorrow morning.   Discussed with Dr. Darrick Meigs from [facility], pt to be admitted.   Final Clinical Impressions(s) / ED Diagnoses   Final diagnoses:  Acute appendicitis, unspecified acute appendicitis type  SIRS (systemic inflammatory response syndrome) [facility])    ED Discharge Orders    None       Franchot Heidelberg, PA-C 02/23/19 0036    Varney Biles, MD 02/24/19 7915    Varney Biles, MD 03/03/19 1746

## 2019-02-23 NOTE — H&P (Signed)
TRH H&P    Patient Demographics:    Charles [name], is a 68 y.o. male  MRN: [id number]  DOB - 1950/10/15  Admit Date - 02/22/2019  Referring MD/NP/PA: Franchot Heidelberg  Outpatient Primary MD for the patient is [name], MD  Patient coming from: Home  Chief complaint-fever, abdominal pain   HPI:    Charles [name]  is a 68 y.o. male, with history of stage IV rectal cancer with lung and pancreatic metastasis currently getting chemotherapy as outpatient, DVT and PE on anticoagulation with Xarelto came to ED with complaint of right-sided abdominal pain.  Patient with a fever of 100.2 last night.  Also had watery stool coming from the ileostomy over the past 2 days.  No blood in stool.  He denies nausea and vomiting.  Denies chest pain or shortness of breath.    Review of systems:    In addition to the HPI above,  No Fever-chills, No Headache, No changes with Vision or hearing, No problems swallowing food or Liquids, No Chest pain, Cough or Shortness of Breath, No Abdominal pain, No Nausea or Vomiting, bowel movements are regular, No Blood in stool or Urine, No dysuria, No new skin rashes or bruises, No new joints pains-aches,  No new weakness, tingling, numbness in any extremity, No recent weight gain or loss, No polyuria, polydypsia or polyphagia, No significant Mental Stressors.  All other systems reviewed and are negative.    Past History of the following :    Past Medical History:  Diagnosis Date   Chronic anticoagulation    Colon cancer ([place]) 07/24/2010   rectal ca, inv adenocarcinoma   Depression 04/01/2011   Diabetes mellitus without complication ([place])    DVT (deep venous thrombosis) ([place]) 05/09/2011   GERD (gastroesophageal reflux disease)    High output ileostomy ([place]) 05/21/2011   History of kidney stones    HTN (hypertension)    Hx of radiation therapy 09/02/10 to  10/14/10   pelvis   Lung metastasis ([place])    Neuropathy    Peripheral vascular disease ([place])    dvt's,pe   Pneumonia    hx x3   Pulmonary embolism ([place])    Rectal cancer ([place]) 01/15/2011   S/P radiation and concurrent 5-FU continuous infusion from 09/09/10- 10/10/10.  S/P proctectomy with colorectal anastomosis and diverting loop ileostomy on 11/14/10 at [facility] by Dr. Harlon Ditty. Pathology reveals a pT3b N1 with 3/20 lymph nodes.        Past Surgical History:  Procedure Laterality Date   BILIARY STENT PLACEMENT N/A 09/16/2018   Procedure: STENT PLACEMENT;  Surgeon: Rogene Houston, MD;  [facility];  Service: Endoscopy;  Laterality: N/A;   BIOPSY  09/24/2018   Procedure: BIOPSY;  Surgeon: Danie Binder, MD;  [facility];  Service: Endoscopy;;   COLON SURGERY  11/14/2010   proctectomy with colorectal anastomosis and diverting loop ileostomy (temporary planned)   COLONOSCOPY  07/2010   proximal rectal apple core mass 10-14cm from anal verge (adenocarcinoma), 2-3cm distal rectal  carpet polyp s/p piecemeal snare polypectomy (adenoma)   COLONOSCOPY  04/21/2012   RMR: Friable,fibrotic appearing colorectal anastomosis producing some luminal narrowing-not felt to be critical. path: focal erosion with slight inflammation and hyperemia. SURVEILLANCE DUE DEC 2015   COLONOSCOPY N/A 11/24/2013   Dr. Rourk:somewhat fibrotic/friable anastomotic mucosal-status post biopsy (narrowing not felt to be clinically significant) Single colonic diverticulum. benign polypoid rectal mucosa   colostomy reversal  april 2013   ERCP N/A 09/16/2018   Procedure: ENDOSCOPIC RETROGRADE CHOLANGIOPANCREATOGRAPHY (ERCP);  Surgeon: Rogene Houston, MD;  [facility];  Service: Endoscopy;  Laterality: N/A;   ESOPHAGOGASTRODUODENOSCOPY  07/2010   RMR: schatki ring s/p dilation, small hh, SB bx benign   ESOPHAGOGASTRODUODENOSCOPY N/A 09/04/2015   Procedure:  ESOPHAGOGASTRODUODENOSCOPY (EGD);  Surgeon: Daneil Dolin, MD;  [facility];  Service: Endoscopy;  Laterality: N/A;  730   EUS  08/2010   Dr. Owens Loffler. uT3N0 circumferential, nearly obstruction rectosigmoid adenocarcinoma, distal edge 12cm from anal verge   EUS N/A 06/19/2016   Procedure: UPPER ENDOSCOPIC ULTRASOUND (EUS) RADIAL;  Surgeon: Milus Banister, MD;  [facility];  Service: Endoscopy;  Laterality: N/A;   FLEXIBLE SIGMOIDOSCOPY N/A 09/24/2018   Procedure: FLEXIBLE SIGMOIDOSCOPY;  Surgeon: Danie Binder, MD;  [facility];  Service: Endoscopy;  Laterality: N/A;   HERNIA REPAIR     abd hernia repair   IVC filter     ivc filter     port a cath placement     PORT-A-CATH REMOVAL  09/24/2011   Procedure: REMOVAL PORT-A-CATH;  Surgeon: Donato Heinz, MD;  Location: [facility];  Service: General;  Laterality: N/A;  Minor Room   PORTACATH PLACEMENT Right 07/07/2016   Procedure: INSERTION PORT-A-CATH;  Surgeon: Aviva Signs, MD;  Location: [facility];  Service: General;  Laterality: Right;   SPHINCTEROTOMY N/A 09/16/2018   Procedure: SPHINCTEROTOMY with balloon dialation;  Surgeon: Rogene Houston, MD;  [facility];  Service: Endoscopy;  Laterality: N/A;   TRANSVERSE LOOP COLOSTOMY N/A 09/29/2018   Procedure: TRANSVERSE LOOP COLOSTOMY;  Surgeon: Aviva Signs, MD;  Location: [facility];  Service: General;  Laterality: N/A;   VIDEO ASSISTED THORACOSCOPY (VATS)/WEDGE RESECTION Right 11/15/2014   Procedure: VIDEO ASSISTED THORACOSCOPY (VATS)/LUNG RESECTION WITH RIGHT LINGULECTOMY;  Surgeon: [name], MD;  Location: [facility];  Service: Thoracic;  Laterality: Right;   VIDEO BRONCHOSCOPY N/A 11/15/2014   Procedure: VIDEO BRONCHOSCOPY;  Surgeon: [name], MD;  Location: [facility] OR;  Service: Thoracic;  Laterality: N/A;      Social History:      Social History   Tobacco Use   Smoking status: Never Smoker   Smokeless tobacco: Never  Used  Substance Use Topics   Alcohol use: No       Family History :     Family History  Problem Relation Age of Onset   Cancer Brother        throat   Cancer Brother        prostate   Colon cancer Neg Hx    Liver disease Neg Hx    Inflammatory bowel disease Neg Hx       Home Medications:   Prior to Admission medications   Medication Sig Start Date End Date Taking? Authorizing Provider  acetaminophen (TYLENOL) 500 MG tablet Take 1,000 mg by mouth every 6 (six) hours as needed for mild pain or moderate pain.    Yes [provider]  clonazePAM Bobbye [place])  1 MG tablet Take 1 tablet by mouth three times daily as needed Patient taking differently: Take 1 mg by mouth 3 (three) times daily.  02/07/19  Yes Derek Jack, MD  Diphenhyd-Hydrocort-Nystatin (FIRST-DUKES MOUTHWASH) SUSP Use as directed 5 mLs in the mouth or throat 4 (four) times daily. 10/07/18  Yes Derek Jack, MD  diphenoxylate-atropine (LOMOTIL) 2.5-0.025 MG tablet Take 2 tablets by mouth 3 (three) times daily as needed for diarrhea or loose stools. 01/26/19  Yes Derek Jack, MD  dronabinol (MARINOL) 5 MG capsule Take 1 capsule (5 mg total) by mouth 2 (two) times daily before a meal. 10/18/18  Yes Derek Jack, MD  gabapentin (NEURONTIN) 300 MG capsule TAKE 3 CAPSULES BY MOUTH TWICE DAILY Patient taking differently: Take 900 mg by mouth 2 (two) times daily.  01/27/19  Yes Derek Jack, MD  glimepiride (AMARYL) 2 MG tablet Take 2 mg by mouth 2 (two) times daily before a meal.    Yes [provider]  mirtazapine (REMERON) 7.5 MG tablet Take 7.5 mg by mouth at bedtime.  11/10/18  Yes [provider]  ondansetron (ZOFRAN) 8 MG tablet Take 1 tablet (8 mg total) by mouth 2 (two) times daily as needed for refractory nausea / vomiting. Start on day 3 after chemotherapy. 10/01/18  Yes Emokpae, Courage, MD  oxyCODONE-acetaminophen (PERCOCET) 10-325 MG tablet Take 1 tablet  by mouth every 6 (six) hours as needed for pain. 10/01/18  Yes Emokpae, Courage, MD  pantoprazole (PROTONIX) 40 MG tablet Take 40 mg by mouth daily.  12/13/18  Yes [provider]  Probiotic Product (PROBIOTIC DAILY PO) Take 1 capsule by mouth daily.   Yes [provider]  rivaroxaban (XARELTO) 20 MG TABS tablet Take 1 tablet (20 mg total) by mouth daily with supper. 09/19/18  Yes Rehman, Mechele Dawley, MD  vitamin B-12 (CYANOCOBALAMIN) 1000 MCG tablet Take 1,000 mcg by mouth daily.   Yes [provider]     Allergies:     Allergies  Allergen Reactions   Oxycodone     Blisters, hallucinations   Tramadol     Blisters, hallucinations   Trazodone And Nefazodone Other (See Comments)    hallucinations     Physical Exam:   Vitals  Blood pressure 114/69, pulse 87, temperature 100.3 F (37.9 C), temperature source Oral, resp. rate 19, height 6\' 2"  (1.88 m), weight 78.5 kg, SpO2 97 %.  1.  General: Appears in no acute distress  2. Psychiatric: Alert, oriented x3, intact insight and judgment  3. Neurologic: Cranial nerves II through XII grossly intact, no focal deficit, moving all extremities  4. HEENMT:  Atraumatic normocephalic, extraocular muscles are intact  5. Respiratory : Clear to auscultation bilaterally  6. Cardiovascular : S1-S2, regular, no murmur auscultated  7. Gastrointestinal:  Abdomen is soft, ileostomy bag in place, with liquid stool in it, tenderness in right lower quadrant, no rigidity or guarding elicited      Data Review:    CBC Recent Labs  Lab 02/22/19 1749  WBC 10.5  HGB 12.3*  HCT 39.6  PLT 254  MCV 93.0  MCH 28.9  MCHC 31.1  RDW 14.5  LYMPHSABS 1.6  MONOABS 1.0  EOSABS 0.0  BASOSABS 0.0   ------------------------------------------------------------------------------------------------------------------  Results for orders placed or performed during the hospital encounter of 02/22/19 (from the past 48 hour(s))   CBC with Differential     Status: Abnormal   Collection Time: 02/22/19  5:49 PM  Result Value Ref Range  WBC 10.5 4.0 - 10.5 K/uL   RBC 4.26 4.22 - 5.81 MIL/uL   Hemoglobin 12.3 (L) 13.0 - 17.0 g/dL   HCT 39.6 39.0 - 52.0 %   MCV 93.0 80.0 - 100.0 fL   MCH 28.9 26.0 - 34.0 pg   MCHC 31.1 30.0 - 36.0 g/dL   RDW 14.5 11.5 - 15.5 %   Platelets 254 150 - 400 K/uL   nRBC 0.0 0.0 - 0.2 %   Neutrophils Relative % 74 %   Neutro Abs 7.7 1.7 - 7.7 K/uL   Lymphocytes Relative 15 %   Lymphs Abs 1.6 0.7 - 4.0 K/uL   Monocytes Relative 10 %   Monocytes Absolute 1.0 0.1 - 1.0 K/uL   Eosinophils Relative 0 %   Eosinophils Absolute 0.0 0.0 - 0.5 K/uL   Basophils Relative 0 %   Basophils Absolute 0.0 0.0 - 0.1 K/uL   Immature Granulocytes 1 %   Abs Immature Granulocytes 0.08 (H) 0.00 - 0.07 K/uL    Comment: Performed at [facility], [address] [place], [place] [address]  Comprehensive metabolic panel     Status: Abnormal   Collection Time: 02/22/19  5:49 PM  Result Value Ref Range   Sodium 132 (L) 135 - 145 mmol/L   Potassium 3.7 3.5 - 5.1 mmol/L   Chloride 101 98 - 111 mmol/L   CO2 22 22 - 32 mmol/L   Glucose, Bld 130 (H) 70 - 99 mg/dL   BUN 10 8 - 23 mg/dL   Creatinine, Ser 0.79 0.61 - 1.24 mg/dL   Calcium 8.0 (L) 8.9 - 10.3 mg/dL   Total Protein 5.9 (L) 6.5 - 8.1 g/dL   Albumin 2.9 (L) 3.5 - 5.0 g/dL   AST 14 (L) 15 - 41 U/L   ALT 17 0 - 44 U/L   Alkaline Phosphatase 103 38 - 126 U/L   Total Bilirubin 0.7 0.3 - 1.2 mg/dL   GFR calc non Af Amer >60 >60 mL/min   GFR calc Af Amer >60 >60 mL/min   Anion gap 9 5 - 15    Comment: Performed at [facility], [address], [place], [place] [address]  Lipase, blood     Status: None   Collection Time: 02/22/19  5:49 PM  Result Value Ref Range   Lipase 14 11 - 51 U/L    Comment: Performed at [facility], [address], [place], [place] [address]  Lactic acid, plasma     Status: Abnormal   Collection Time: 02/22/19  5:49 PM    Result Value Ref Range   Lactic Acid, Venous 2.7 (HH) 0.5 - 1.9 mmol/L    Comment: CRITICAL RESULT CALLED TO, READ BACK BY AND VERIFIED WITH: BELTON,C ON 02/22/19 AT 1915 BY LOY,C Performed at [facility], [address], [place], [place] [address]   Blood culture (routine x 2)     Status: None (Preliminary result)   Collection Time: 02/22/19  6:51 PM   Specimen: BLOOD LEFT WRIST  Result Value Ref Range   Specimen Description BLOOD LEFT WRIST    Special Requests      BOTTLES DRAWN AEROBIC AND ANAEROBIC Blood Culture adequate volume Performed at [facility], [address], [place], [place] [address]    Culture PENDING    Report Status PENDING   Blood culture (routine x 2)     Status: None (Preliminary result)   Collection Time: 02/22/19  6:52 PM   Specimen: Right Antecubital; Blood  Result Value Ref Range   Specimen Description RIGHT ANTECUBITAL    Special Requests      BOTTLES DRAWN AEROBIC AND ANAEROBIC Blood Culture adequate volume Performed at [facility], [address], [place], [place] [address]    Culture PENDING    Report Status PENDING   Lactic acid, plasma     Status: Abnormal   Collection Time: 02/22/19  8:03 PM  Result Value Ref Range   Lactic Acid, Venous 2.0 (HH) 0.5 - 1.9 mmol/L    Comment: CRITICAL VALUE NOTED.  VALUE IS CONSISTENT WITH PREVIOUSLY REPORTED AND CALLED VALUE. Performed at [facility], [address], [place], [place] [address]   Urinalysis, Routine w reflex microscopic     Status: Abnormal   Collection Time: 02/22/19  8:13 PM  Result Value Ref Range   Color, Urine AMBER (A) YELLOW    Comment: BIOCHEMICALS MAY BE AFFECTED BY COLOR   APPearance CLEAR CLEAR   Specific Gravity, Urine 1.026 1.005 - 1.030   pH 6.0 5.0 - 8.0   Glucose, UA NEGATIVE NEGATIVE mg/dL   Hgb urine dipstick NEGATIVE NEGATIVE   Bilirubin Urine NEGATIVE NEGATIVE   Ketones, ur NEGATIVE NEGATIVE mg/dL   Protein, ur 30 (A) NEGATIVE mg/dL   Nitrite NEGATIVE NEGATIVE    Leukocytes,Ua NEGATIVE NEGATIVE   RBC / HPF 0-5 0 - 5 RBC/hpf   WBC, UA 0-5 0 - 5 WBC/hpf   Bacteria, UA NONE SEEN NONE SEEN   Mucus PRESENT     Comment: Performed at [facility], [address], [place], [place] [address]  SARS Coronavirus 2 by RT PCR (hospital order, performed in [place] hospital lab) Nasopharyngeal Nasopharyngeal Swab     Status: None   Collection Time: 02/22/19 10:22 PM   Specimen: Nasopharyngeal Swab  Result Value Ref Range   SARS Coronavirus 2 NEGATIVE NEGATIVE    Comment: (NOTE) If result is NEGATIVE SARS-CoV-2 target nucleic acids are NOT DETECTED. The SARS-CoV-2 RNA is generally detectable in upper and lower  respiratory specimens during the acute phase of infection. The lowest  concentration of SARS-CoV-2 viral copies this assay can detect is 250  copies / mL. A negative result does not preclude SARS-CoV-2 infection  and should not be used as the sole basis for treatment or other  patient management decisions.  A negative result may occur with  improper specimen collection / handling, submission of specimen other  than nasopharyngeal swab, presence of viral mutation(s) within the  areas targeted by this assay, and inadequate number of viral copies  (<250 copies / mL). A negative result must be combined with clinical  observations, patient history, and epidemiological information. If result is POSITIVE SARS-CoV-2 target nucleic acids are DETECTED. The SARS-CoV-2 RNA is generally detectable in upper and lower  respiratory specimens dur ing the acute phase of infection.  Positive  results are indicative of active infection with SARS-CoV-2.  Clinical  correlation with patient history and other diagnostic information is  necessary to determine patient infection status.  Positive results do  not rule out bacterial infection or co-infection with other viruses. If result is PRESUMPTIVE POSTIVE SARS-CoV-2 nucleic acids MAY BE PRESENT.   A presumptive positive  result was obtained on the submitted specimen  and confirmed on repeat testing.  While 2019 novel coronavirus  (SARS-CoV-2) nucleic acids may be present in the submitted sample  additional confirmatory testing may be necessary for epidemiological  and / or clinical management purposes  to differentiate between  SARS-CoV-2  and other Sarbecovirus currently known to infect humans.  If clinically indicated additional testing with an alternate test  methodology [phone]) is advised. The SARS-CoV-2 RNA is generally  detectable in upper and lower respiratory sp ecimens during the acute  phase of infection. The expected result is Negative. Fact Sheet for Patients:  StrictlyIdeas.no Fact Sheet for Healthcare Providers: BankingDealers.co.za This test is not yet approved or cleared by the [place] FDA and has been authorized for detection and/or diagnosis of SARS-CoV-2 by FDA under an Emergency Use Authorization (EUA).  This EUA will remain in effect (meaning this test can be used) for the duration of the COVID-19 declaration under Section 564(b)(1) of the Act, 21 U.S.C. section 360bbb-3(b)(1), unless the authorization is terminated or revoked sooner. Performed at [facility], [address] [place], [place] [address]    *Note: Due to a large number of results and/or encounters for the requested time period, some results have not been displayed. A complete set of results can be found in Results Review.    Chemistries  Recent Labs  Lab 02/22/19 1749  NA 132*  K 3.7  CL 101  CO2 22  GLUCOSE 130*  BUN 10  CREATININE 0.79  CALCIUM 8.0*  AST 14*  ALT 17  ALKPHOS 103  BILITOT 0.7   ------------------------------------------------------------------------------------------------------------------  ------------------------------------------------------------------------------------------------------------------ GFR: Estimated Creatinine  Clearance: 98.1 mL/min (by C-G formula based on SCr of 0.79 mg/dL). Liver Function Tests: Recent Labs  Lab 02/22/19 1749  AST 14*  ALT 17  ALKPHOS 103  BILITOT 0.7  PROT 5.9*  ALBUMIN 2.9*   Recent Labs  Lab 02/22/19 1749  LIPASE 14    --------------------------------------------------------------------------------------------------------------- Urine analysis:    Component Value Date/Time   COLORURINE AMBER (A) 02/22/2019 2013   APPEARANCEUR CLEAR 02/22/2019 2013   LABSPEC 1.026 02/22/2019 2013   PHURINE 6.0 02/22/2019 2013   [place] 02/22/2019 2013   [place] NEGATIVE 02/22/2019 2013   [place] NEGATIVE 02/22/2019 2013   [place] NEGATIVE 02/22/2019 2013   PROTEINUR 30 (A) 02/22/2019 2013   UROBILINOGEN 1.0 11/14/2014 1504   NITRITE NEGATIVE 02/22/2019 2013   LEUKOCYTESUR NEGATIVE 02/22/2019 2013      Imaging Results:    Ct Chest W Contrast  Result Date: 02/22/2019 CLINICAL DATA:  Nausea vomiting diarrhea history of rectal cancer with metastatic disease EXAM: CT CHEST, ABDOMEN, AND PELVIS WITH CONTRAST TECHNIQUE: Multidetector CT imaging of the chest, abdomen and pelvis was performed following the standard protocol during bolus administration of intravenous contrast. CONTRAST:  189mL OMNIPAQUE IOHEXOL 300 MG/ML  SOLN COMPARISON:  CT 09/22/2018, 07/30/2018, 05/28/2018 FINDINGS: CT CHEST FINDINGS Cardiovascular: Nonaneurysmal aorta. Mild aortic atherosclerosis. Coronary vascular calcification. Normal heart size. No pericardial effusion. Right-sided central venous port with tip at the distal SVC. Mediastinum/Nodes: Midline trachea. No thyroid mass. No increasing mediastinal adenopathy. Esophagus within normal limits. Lungs/Pleura: No acute consolidation or pleural effusion. Stable 6 mm anterior right upper lobe pulmonary nodule, series 3, image number 106, previously 6 mm. Posterior right upper lobe pulmonary nodule, series 3, image number 41 measures 11 mm,  previously 11 mm. Posterior right upper lobe 11 mm pulmonary nodule, series 3, image number 75, previously 13 mm. No new pulmonary nodule. Right middle lobe postsurgical changes Musculoskeletal: Stable mixed sclerotic and lucent lesion in the lower sternum. Degenerative changes of the spine. CT ABDOMEN PELVIS FINDINGS Hepatobiliary: Contracted gallbladder without calcified stone. No focal hepatic abnormality. No ductal dilatation Pancreas: Diffusely atrophic. Diffuse pancreatic duct dilatation with truncation at the pancreatic head.  Stable 2.1 cm mass at the head of the pancreas. Spleen: Normal in size without focal abnormality. Adrenals/Urinary Tract: Left adrenal calcification. No adrenal mass. Kidneys show no hydronephrosis. Stable subcentimeter exophytic slightly dense lesion off the lower pole left kidney. The bladder is slightly thick walled. Stomach/Bowel: The stomach is nonenlarged. No dilated small bowel. Status post partial proctocolectomy decreased rectal wall thickening compared to most recent prior. Interval left lower quadrant loop colostomy. Abnormal appendix. Small appendicoliths. Considerable periappendiceal inflammatory change. Appendix tip dilated up to 2.1 cm. No extraluminal gas. Possible tiny 7 mm fluid collection adjacent to tip of appendix. Vascular/Lymphatic: IVC filter with tip just below the renal vein confluence. Similar protrusion struts beyond the IVC lumen. Nonaneurysmal aorta with mild aortic atherosclerosis. Subcentimeter lymph nodes in the right lower quadrant could be reactive. Reproductive: Prostate calcification Other: Mild presacral soft tissue stranding. No free fluid. No significant pelvic effusion Musculoskeletal: No acute or suspicious osseous abnormality IMPRESSION: 1. Findings consistent with acute appendicitis. No definitive extraluminal gas though considerable inflammatory change about the appendix and possible tiny 7 mm tiny fluid collection adjacent to the tip of the  appendix. Appendix: Location: Right lower quadrant Diameter: Up to 21 mm Appendicolith: Positive for stone near the tip Mucosal hyper-enhancement: Negative Extraluminal gas: Negative Periappendiceal collection: Possible tiny 7 mm fluid collection adjacent to the tip of the appendix. 2. Status post partial proctocolectomy with interval left lower quadrant loop colostomy. Decreased rectal wall thickening. 3.  Stable right upper lobe pulmonary nodules. 4. Stable 2 cm mass at the head of pancreas with atrophy ductal obstruction distal to this 5. Stable mixed lucent and sclerotic lesion involving the lower sternum Electronically Signed   By: Donavan Foil M.D.   On: 02/22/2019 21:40   Ct Abdomen Pelvis W Contrast  Result Date: 02/22/2019 CLINICAL DATA:  Nausea vomiting diarrhea history of rectal cancer with metastatic disease EXAM: CT CHEST, ABDOMEN, AND PELVIS WITH CONTRAST TECHNIQUE: Multidetector CT imaging of the chest, abdomen and pelvis was performed following the standard protocol during bolus administration of intravenous contrast. CONTRAST:  168mL OMNIPAQUE IOHEXOL 300 MG/ML  SOLN COMPARISON:  CT 09/22/2018, 07/30/2018, 05/28/2018 FINDINGS: CT CHEST FINDINGS Cardiovascular: Nonaneurysmal aorta. Mild aortic atherosclerosis. Coronary vascular calcification. Normal heart size. No pericardial effusion. Right-sided central venous port with tip at the distal SVC. Mediastinum/Nodes: Midline trachea. No thyroid mass. No increasing mediastinal adenopathy. Esophagus within normal limits. Lungs/Pleura: No acute consolidation or pleural effusion. Stable 6 mm anterior right upper lobe pulmonary nodule, series 3, image number 106, previously 6 mm. Posterior right upper lobe pulmonary nodule, series 3, image number 41 measures 11 mm, previously 11 mm. Posterior right upper lobe 11 mm pulmonary nodule, series 3, image number 75, previously 13 mm. No new pulmonary nodule. Right middle lobe postsurgical changes  Musculoskeletal: Stable mixed sclerotic and lucent lesion in the lower sternum. Degenerative changes of the spine. CT ABDOMEN PELVIS FINDINGS Hepatobiliary: Contracted gallbladder without calcified stone. No focal hepatic abnormality. No ductal dilatation Pancreas: Diffusely atrophic. Diffuse pancreatic duct dilatation with truncation at the pancreatic head. Stable 2.1 cm mass at the head of the pancreas. Spleen: Normal in size without focal abnormality. Adrenals/Urinary Tract: Left adrenal calcification. No adrenal mass. Kidneys show no hydronephrosis. Stable subcentimeter exophytic slightly dense lesion off the lower pole left kidney. The bladder is slightly thick walled. Stomach/Bowel: The stomach is nonenlarged. No dilated small bowel. Status post partial proctocolectomy decreased rectal wall thickening compared to most recent prior. Interval left lower quadrant loop colostomy.  Abnormal appendix. Small appendicoliths. Considerable periappendiceal inflammatory change. Appendix tip dilated up to 2.1 cm. No extraluminal gas. Possible tiny 7 mm fluid collection adjacent to tip of appendix. Vascular/Lymphatic: IVC filter with tip just below the renal vein confluence. Similar protrusion struts beyond the IVC lumen. Nonaneurysmal aorta with mild aortic atherosclerosis. Subcentimeter lymph nodes in the right lower quadrant could be reactive. Reproductive: Prostate calcification Other: Mild presacral soft tissue stranding. No free fluid. No significant pelvic effusion Musculoskeletal: No acute or suspicious osseous abnormality IMPRESSION: 1. Findings consistent with acute appendicitis. No definitive extraluminal gas though considerable inflammatory change about the appendix and possible tiny 7 mm tiny fluid collection adjacent to the tip of the appendix. Appendix: Location: Right lower quadrant Diameter: Up to 21 mm Appendicolith: Positive for stone near the tip Mucosal hyper-enhancement: Negative Extraluminal gas:  Negative Periappendiceal collection: Possible tiny 7 mm fluid collection adjacent to the tip of the appendix. 2. Status post partial proctocolectomy with interval left lower quadrant loop colostomy. Decreased rectal wall thickening. 3.  Stable right upper lobe pulmonary nodules. 4. Stable 2 cm mass at the head of pancreas with atrophy ductal obstruction distal to this 5. Stable mixed lucent and sclerotic lesion involving the lower sternum Electronically Signed   By: Donavan Foil M.D.   On: 02/22/2019 21:40       Assessment & Plan:    Active Problems:   Appendicitis   1. Appendicitis-patient presented with fever, abdominal pain, CT abdomen showed findings consistent with acute appendicitis.  Patient started on vancomycin and cefepime in the ED.  General surgery was recommended, plan for surgery in a.m.  We will keep patient n.p.o., will switch cefepime to Zosyn as per surgery recommendation.  Continue vancomycin per pharmacy consultation.  2. Rectal cancer with mets to lung and pancreas-patient receiving chemotherapy as outpatient.  3. History of DVT/PE-we will hold Xarelto for now for surgery in a.m.  Consider restarting Xarelto after surgery, after discussion with general surgery.  4. Diabetes mellitus-hold Amaryl, will initiate sliding scale insulin with NovoLog.      DVT Prophylaxis-patient is on Xarelto  AM Labs Ordered, also please review Full Orders  Family Communication: Admission, patients condition and plan of care including tests being ordered have been discussed with the patient  who indicate understanding and agree with the plan and Code Status.  Code Status: Full code  Admission status: Inpatient: Based on patients clinical presentation and evaluation of above clinical data, I have made determination that patient meets Inpatient criteria at this time.  Time spent in minutes : 60 minutes   Oswald Hillock M.D on 02/23/2019 at 2:49 AM

## 2019-02-23 NOTE — Progress Notes (Signed)
PROGRESS NOTE  THEODEN MAUCH OKH:[id number] DOB: March 30, 1951 DOA: 02/22/2019 PCP: [name], MD  Brief History:  68 year old male with a history of stage IV rectal cancer with metastasis to lung and pancreas, DVT, PE, diabetes mellitus type 2, peripheral vascular disease presenting with 1 day history of right-sided abdominal pain with fever up to 100.0 F.  Patient also noted some loose stools in his ileostomy which is unusual for him.  He had denied any vomiting but had some nausea.  He denied any headache, neck pain, coughing, hemoptysis, chest pain, shortness of breath, palpitations, dysuria. In the ED, the patient had temperature up to 100.3 F, but he was hemodynamically stable with oxygen saturation 97% on room air.  WBC was up to 12.3 with lactic acid 2.7.  CT of the abdomen and pelvis showed stable right upper lobe pulmonary nodules, stable pancreatic head mass, periappendiceal inflammation with the tip of the appendix dilated 2.1 cm and possible 7 mm fluid collection adjacent to the tip of the appendix.  The patient was started on Zosyn.  General surgery was consulted.  Assessment/Plan: Sepsis -Present at the time of admission -Secondary to appendicitis -Lactic acid peaked 2.7 -Continue IV fluids -Urinalysis negative for pyuria -Continue Zosyn -Follow blood cultures  Acute appendicitis -CT abdomen as discussed above -General surgery consult -Remain n.p.o.  Stage IV rectal cancer -Last chemotherapy 02/09/2019 -Patient follows Dr. [name]  History of DVT/PE -Holding rivaroxaban secondary to anticipation for surgery -Last dose of rivaroxaban evening 02/21/2019 -pt has IVC filter  Diabetes mellitus type 2 -NovoLog sliding scale -Holding glimepiride -Hemoglobin A1c  Depression/anxiety -Continue home dose Remeron and Klonopin  Chronic pain -Continue home dose Percocet       Disposition Plan:   Home in 1-2 days  Family Communication:   Spouse  updated at bedside 10/21  Consultants:  General surgery  Code Status:  FULL   DVT Prophylaxis:  xarelto   Procedures: As Listed in Progress Note Above  Antibiotics: Zosyn 10/21>>>     Subjective: Patient states that abdominal pain is under control.  He has no nausea without emesis.  Denies any fevers, chills, headache, chest pain, cough, hemoptysis.  There is no dysuria or hematuria.  Objective: Vitals:   02/23/19 0500 02/23/19 0530 02/23/19 0700 02/23/19 0731  BP: 116/73 121/71 128/69   Pulse: 92 94 88   Resp: 17 17 16    Temp:    98.6 F (37 C)  TempSrc:    Oral  SpO2: 96% 95% 97%   Weight:      Height:        Intake/Output Summary (Last 24 hours) at 02/23/2019 0810 Last data filed at 02/23/2019 0736 Gross per 24 hour  Intake 1700.01 ml  Output 300 ml  Net 1400.01 ml   Weight change:  Exam:   General:  Pt is alert, follows commands appropriately, not in acute distress  HEENT: No icterus, No thrush, No neck mass, [place]/AT  Cardiovascular: RRR, S1/S2, no rubs, no gallops  Respiratory: CTA bilaterally, no wheezing, no crackles, no rhonchi  Abdomen: Soft/+BS, RLQ tender, non distended, no guarding  Extremities: No edema, No lymphangitis, No petechiae, No rashes, no synovitis   Data Reviewed: I have personally reviewed following labs and imaging studies Basic Metabolic Panel: Recent Labs  Lab 02/22/19 1749 02/23/19 0404  NA 132* 137  K 3.7 3.8  CL 101 106  CO2 22 23  GLUCOSE 130* 93  BUN 10 8  CREATININE 0.79 0.63  CALCIUM 8.0* 7.9*   Liver Function Tests: Recent Labs  Lab 02/22/19 1749 02/23/19 0404  AST 14* 13*  ALT 17 15  ALKPHOS 103 95  BILITOT 0.7 0.8  PROT 5.9* 5.2*  ALBUMIN 2.9* 2.5*   Recent Labs  Lab 02/22/19 1749  LIPASE 14   No results for input(s): AMMONIA in the last 168 hours. Coagulation Profile: No results for input(s): INR, PROTIME in the last 168 hours. CBC: Recent Labs  Lab 02/22/19 1749 02/23/19 0404  WBC  10.5 12.3*  NEUTROABS 7.7  --   HGB 12.3* 11.2*  HCT 39.6 35.8*  MCV 93.0 93.2  PLT 254 231   Cardiac Enzymes: No results for input(s): CKTOTAL, CKMB, CKMBINDEX, TROPONINI in the last 168 hours. BNP: Invalid input(s): POCBNP CBG: Recent Labs  Lab 02/23/19 0729  GLUCAP 81   HbA1C: No results for input(s): HGBA1C in the last 72 hours. Urine analysis:    Component Value Date/Time   COLORURINE AMBER (A) 02/22/2019 2013   APPEARANCEUR CLEAR 02/22/2019 2013   LABSPEC 1.026 02/22/2019 2013   PHURINE 6.0 02/22/2019 2013   [place] 02/22/2019 2013   HGBUR NEGATIVE 02/22/2019 2013   [place] 02/22/2019 2013   [place] 02/22/2019 2013   PROTEINUR 30 (A) 02/22/2019 2013   UROBILINOGEN 1.0 11/14/2014 1504   NITRITE NEGATIVE 02/22/2019 2013   LEUKOCYTESUR NEGATIVE 02/22/2019 2013   Sepsis Labs: @LABRCNTIP (procalcitonin:4,lacticidven:4) ) Recent Results (from the past 240 hour(s))  Blood culture (routine x 2)     Status: None (Preliminary result)   Collection Time: 02/22/19  6:51 PM   Specimen: BLOOD LEFT WRIST  Result Value Ref Range Status   Specimen Description BLOOD LEFT WRIST  Final   Special Requests   Final    BOTTLES DRAWN AEROBIC AND ANAEROBIC Blood Culture adequate volume   Culture   Final    NO GROWTH < 12 HOURS Performed at [facility], [address], [place], [place] [address]    Report Status PENDING  Incomplete  Blood culture (routine x 2)     Status: None (Preliminary result)   Collection Time: 02/22/19  6:52 PM   Specimen: Right Antecubital; Blood  Result Value Ref Range Status   Specimen Description RIGHT ANTECUBITAL  Final   Special Requests   Final    BOTTLES DRAWN AEROBIC AND ANAEROBIC Blood Culture adequate volume   Culture   Final    NO GROWTH < 12 HOURS Performed at [facility], [address] [place], [place] [address]    Report Status PENDING  Incomplete  SARS Coronavirus 2 by RT PCR (hospital order, performed  in [place] hospital lab) Nasopharyngeal Nasopharyngeal Swab     Status: None   Collection Time: 02/22/19 10:22 PM   Specimen: Nasopharyngeal Swab  Result Value Ref Range Status   SARS Coronavirus 2 NEGATIVE NEGATIVE Final    Comment: (NOTE) If result is NEGATIVE SARS-CoV-2 target nucleic acids are NOT DETECTED. The SARS-CoV-2 RNA is generally detectable in upper and lower  respiratory specimens during the acute phase of infection. The lowest  concentration of SARS-CoV-2 viral copies this assay can detect is 250  copies / mL. A negative result does not preclude SARS-CoV-2 infection  and should not be used as the sole basis for treatment or other  patient management decisions.  A negative result may occur with  improper specimen collection / handling, submission of specimen other  than nasopharyngeal swab, presence  of viral mutation(s) within the  areas targeted by this assay, and inadequate number of viral copies  (<250 copies / mL). A negative result must be combined with clinical  observations, patient history, and epidemiological information. If result is POSITIVE SARS-CoV-2 target nucleic acids are DETECTED. The SARS-CoV-2 RNA is generally detectable in upper and lower  respiratory specimens dur ing the acute phase of infection.  Positive  results are indicative of active infection with SARS-CoV-2.  Clinical  correlation with patient history and other diagnostic information is  necessary to determine patient infection status.  Positive results do  not rule out bacterial infection or co-infection with other viruses. If result is PRESUMPTIVE POSTIVE SARS-CoV-2 nucleic acids MAY BE PRESENT.   A presumptive positive result was obtained on the submitted specimen  and confirmed on repeat testing.  While 2019 novel coronavirus  (SARS-CoV-2) nucleic acids may be present in the submitted sample  additional confirmatory testing may be necessary for epidemiological  and / or clinical  management purposes  to differentiate between  SARS-CoV-2 and other Sarbecovirus currently known to infect humans.  If clinically indicated additional testing with an alternate test  methodology [phone]) is advised. The SARS-CoV-2 RNA is generally  detectable in upper and lower respiratory sp ecimens during the acute  phase of infection. The expected result is Negative. Fact Sheet for Patients:  StrictlyIdeas.no Fact Sheet for Healthcare Providers: BankingDealers.co.za This test is not yet approved or cleared by the [place] FDA and has been authorized for detection and/or diagnosis of SARS-CoV-2 by FDA under an Emergency Use Authorization (EUA).  This EUA will remain in effect (meaning this test can be used) for the duration of the COVID-19 declaration under Section 564(b)(1) of the Act, 21 U.S.C. section 360bbb-3(b)(1), unless the authorization is terminated or revoked sooner. Performed at [facility], [address] [place], [place] [address]      Scheduled Meds: . insulin aspart  0-9 Units Subcutaneous TID WC   Continuous Infusions: . sodium chloride 100 mL/hr at 02/23/19 0354  . piperacillin-tazobactam    . piperacillin-tazobactam (ZOSYN)  IV    . vancomycin (VANCOCIN) 1250 mg/246mL IVPB (Vial-Mate [employer])      Procedures/Studies: Ct Chest W Contrast  Result Date: 02/22/2019 CLINICAL DATA:  Nausea vomiting diarrhea history of rectal cancer with metastatic disease EXAM: CT CHEST, ABDOMEN, AND PELVIS WITH CONTRAST TECHNIQUE: Multidetector CT imaging of the chest, abdomen and pelvis was performed following the standard protocol during bolus administration of intravenous contrast. CONTRAST:  139mL OMNIPAQUE IOHEXOL 300 MG/ML  SOLN COMPARISON:  CT 09/22/2018, 07/30/2018, 05/28/2018 FINDINGS: CT CHEST FINDINGS Cardiovascular: Nonaneurysmal aorta. Mild aortic atherosclerosis. Coronary vascular calcification. Normal heart size. No  pericardial effusion. Right-sided central venous port with tip at the distal SVC. Mediastinum/Nodes: Midline trachea. No thyroid mass. No increasing mediastinal adenopathy. Esophagus within normal limits. Lungs/Pleura: No acute consolidation or pleural effusion. Stable 6 mm anterior right upper lobe pulmonary nodule, series 3, image number 106, previously 6 mm. Posterior right upper lobe pulmonary nodule, series 3, image number 41 measures 11 mm, previously 11 mm. Posterior right upper lobe 11 mm pulmonary nodule, series 3, image number 75, previously 13 mm. No new pulmonary nodule. Right middle lobe postsurgical changes Musculoskeletal: Stable mixed sclerotic and lucent lesion in the lower sternum. Degenerative changes of the spine. CT ABDOMEN PELVIS FINDINGS Hepatobiliary: Contracted gallbladder without calcified stone. No focal hepatic abnormality. No ductal dilatation Pancreas: Diffusely atrophic. Diffuse pancreatic duct dilatation with truncation at the pancreatic head. Stable  2.1 cm mass at the head of the pancreas. Spleen: Normal in size without focal abnormality. Adrenals/Urinary Tract: Left adrenal calcification. No adrenal mass. Kidneys show no hydronephrosis. Stable subcentimeter exophytic slightly dense lesion off the lower pole left kidney. The bladder is slightly thick walled. Stomach/Bowel: The stomach is nonenlarged. No dilated small bowel. Status post partial proctocolectomy decreased rectal wall thickening compared to most recent prior. Interval left lower quadrant loop colostomy. Abnormal appendix. Small appendicoliths. Considerable periappendiceal inflammatory change. Appendix tip dilated up to 2.1 cm. No extraluminal gas. Possible tiny 7 mm fluid collection adjacent to tip of appendix. Vascular/Lymphatic: IVC filter with tip just below the renal vein confluence. Similar protrusion struts beyond the IVC lumen. Nonaneurysmal aorta with mild aortic atherosclerosis. Subcentimeter lymph nodes in the  right lower quadrant could be reactive. Reproductive: Prostate calcification Other: Mild presacral soft tissue stranding. No free fluid. No significant pelvic effusion Musculoskeletal: No acute or suspicious osseous abnormality IMPRESSION: 1. Findings consistent with acute appendicitis. No definitive extraluminal gas though considerable inflammatory change about the appendix and possible tiny 7 mm tiny fluid collection adjacent to the tip of the appendix. Appendix: Location: Right lower quadrant Diameter: Up to 21 mm Appendicolith: Positive for stone near the tip Mucosal hyper-enhancement: Negative Extraluminal gas: Negative Periappendiceal collection: Possible tiny 7 mm fluid collection adjacent to the tip of the appendix. 2. Status post partial proctocolectomy with interval left lower quadrant loop colostomy. Decreased rectal wall thickening. 3.  Stable right upper lobe pulmonary nodules. 4. Stable 2 cm mass at the head of pancreas with atrophy ductal obstruction distal to this 5. Stable mixed lucent and sclerotic lesion involving the lower sternum Electronically Signed   By: Donavan Foil M.D.   On: 02/22/2019 21:40   Ct Abdomen Pelvis W Contrast  Result Date: 02/22/2019 CLINICAL DATA:  Nausea vomiting diarrhea history of rectal cancer with metastatic disease EXAM: CT CHEST, ABDOMEN, AND PELVIS WITH CONTRAST TECHNIQUE: Multidetector CT imaging of the chest, abdomen and pelvis was performed following the standard protocol during bolus administration of intravenous contrast. CONTRAST:  153mL OMNIPAQUE IOHEXOL 300 MG/ML  SOLN COMPARISON:  CT 09/22/2018, 07/30/2018, 05/28/2018 FINDINGS: CT CHEST FINDINGS Cardiovascular: Nonaneurysmal aorta. Mild aortic atherosclerosis. Coronary vascular calcification. Normal heart size. No pericardial effusion. Right-sided central venous port with tip at the distal SVC. Mediastinum/Nodes: Midline trachea. No thyroid mass. No increasing mediastinal adenopathy. Esophagus within  normal limits. Lungs/Pleura: No acute consolidation or pleural effusion. Stable 6 mm anterior right upper lobe pulmonary nodule, series 3, image number 106, previously 6 mm. Posterior right upper lobe pulmonary nodule, series 3, image number 41 measures 11 mm, previously 11 mm. Posterior right upper lobe 11 mm pulmonary nodule, series 3, image number 75, previously 13 mm. No new pulmonary nodule. Right middle lobe postsurgical changes Musculoskeletal: Stable mixed sclerotic and lucent lesion in the lower sternum. Degenerative changes of the spine. CT ABDOMEN PELVIS FINDINGS Hepatobiliary: Contracted gallbladder without calcified stone. No focal hepatic abnormality. No ductal dilatation Pancreas: Diffusely atrophic. Diffuse pancreatic duct dilatation with truncation at the pancreatic head. Stable 2.1 cm mass at the head of the pancreas. Spleen: Normal in size without focal abnormality. Adrenals/Urinary Tract: Left adrenal calcification. No adrenal mass. Kidneys show no hydronephrosis. Stable subcentimeter exophytic slightly dense lesion off the lower pole left kidney. The bladder is slightly thick walled. Stomach/Bowel: The stomach is nonenlarged. No dilated small bowel. Status post partial proctocolectomy decreased rectal wall thickening compared to most recent prior. Interval left lower quadrant loop colostomy. Abnormal  appendix. Small appendicoliths. Considerable periappendiceal inflammatory change. Appendix tip dilated up to 2.1 cm. No extraluminal gas. Possible tiny 7 mm fluid collection adjacent to tip of appendix. Vascular/Lymphatic: IVC filter with tip just below the renal vein confluence. Similar protrusion struts beyond the IVC lumen. Nonaneurysmal aorta with mild aortic atherosclerosis. Subcentimeter lymph nodes in the right lower quadrant could be reactive. Reproductive: Prostate calcification Other: Mild presacral soft tissue stranding. No free fluid. No significant pelvic effusion Musculoskeletal: No  acute or suspicious osseous abnormality IMPRESSION: 1. Findings consistent with acute appendicitis. No definitive extraluminal gas though considerable inflammatory change about the appendix and possible tiny 7 mm tiny fluid collection adjacent to the tip of the appendix. Appendix: Location: Right lower quadrant Diameter: Up to 21 mm Appendicolith: Positive for stone near the tip Mucosal hyper-enhancement: Negative Extraluminal gas: Negative Periappendiceal collection: Possible tiny 7 mm fluid collection adjacent to the tip of the appendix. 2. Status post partial proctocolectomy with interval left lower quadrant loop colostomy. Decreased rectal wall thickening. 3.  Stable right upper lobe pulmonary nodules. 4. Stable 2 cm mass at the head of pancreas with atrophy ductal obstruction distal to this 5. Stable mixed lucent and sclerotic lesion involving the lower sternum Electronically Signed   By: Donavan Foil M.D.   On: 02/22/2019 21:40    Orson Eva, DO  [facility] Pager [phone]  If 7PM-7AM, please contact night-coverage [email or web address] Password [email or web address] 02/23/2019, 8:10 AM   LOS: 0 days

## 2019-02-23 NOTE — Progress Notes (Signed)
Pharmacy Antibiotic Note  Charles [name] is a 68 y.o. male with B-cell lymphoma admitted on 02/22/2019 with febrile neutropenia.  Pharmacy has been consulted for vancomycin and cefepime dosing.  Cefepime has been subsequently changed to Zosyn.    Plan:  Zosyn 3.375 gr IV q8h extended interval   Vancomycin 1500mg  IV x 1, then 1250mg  IV q12h for estimated AUC 509  Check vancomycin trough if continues > 72hr, sooner if renal function warrants  Follow up renal function & cultures  Height: 6\' 2"  (188 cm) Weight: 173 lb (78.5 kg) IBW/kg (Calculated) : 82.2  Temp (24hrs), Avg:99.6 F (37.6 C), Min:98.8 F (37.1 C), Max:100.3 F (37.9 C)  Recent Labs  Lab 02/22/19 1749 02/22/19 2003  WBC 10.5  --   CREATININE 0.79  --   LATICACIDVEN 2.7* 2.0*    Estimated Creatinine Clearance: 98.1 mL/min (by C-G formula based on SCr of 0.79 mg/dL).    Allergies  Allergen Reactions  . Oxycodone     Blisters, hallucinations  . Tramadol     Blisters, hallucinations  . Trazodone And Nefazodone Other (See Comments)    hallucinations    Antimicrobials this admission: 10/20 Vancomycin >> 10/20 Cefepime >> 10/21 10/20 Flagyl 10/21 Zosyn >>   Dose adjustments this admission:  Microbiology results: 10/20 BCx: 10/20 UCx: 10/20 C.diff: 10/20 GI panel: 10/20 SARS CoV-2:  Thank you for allowing pharmacy to be a part of this patient's care.   Royetta Asal, PharmD, BCPS 02/23/2019 4:34 AM

## 2019-02-24 LAB — CBC
HCT: 34.3 % — ABNORMAL LOW (ref 39.0–52.0)
Hemoglobin: 10.7 g/dL — ABNORMAL LOW (ref 13.0–17.0)
MCH: 28.5 pg (ref 26.0–34.0)
MCHC: 31.2 g/dL (ref 30.0–36.0)
MCV: 91.5 fL (ref 80.0–100.0)
Platelets: 250 10*3/uL (ref 150–400)
RBC: 3.75 MIL/uL — ABNORMAL LOW (ref 4.22–5.81)
RDW: 14.4 % (ref 11.5–15.5)
WBC: 12.8 10*3/uL — ABNORMAL HIGH (ref 4.0–10.5)
nRBC: 0 % (ref 0.0–0.2)

## 2019-02-24 LAB — BASIC METABOLIC PANEL
Anion gap: 7 (ref 5–15)
BUN: 7 mg/dL — ABNORMAL LOW (ref 8–23)
CO2: 22 mmol/L (ref 22–32)
Calcium: 7.7 mg/dL — ABNORMAL LOW (ref 8.9–10.3)
Chloride: 107 mmol/L (ref 98–111)
Creatinine, Ser: 0.66 mg/dL (ref 0.61–1.24)
GFR calc Af Amer: >60 mL/min (ref >60)
GFR calc non Af Amer: >60 mL/min (ref >60)
Glucose, Bld: 168 mg/dL — ABNORMAL HIGH (ref 70–99)
Potassium: 3.7 mmol/L (ref 3.5–5.1)
Sodium: 136 mmol/L (ref 135–145)

## 2019-02-24 LAB — URINE CULTURE

## 2019-02-24 LAB — HEPARIN LEVEL (UNFRACTIONATED)
Heparin Unfractionated: 0.55 IU/mL (ref 0.30–0.70)
Heparin Unfractionated: 0.83 IU/mL — ABNORMAL HIGH (ref 0.30–0.70)
Heparin Unfractionated: 0.19 IU/mL — ABNORMAL LOW (ref 0.30–0.70)

## 2019-02-24 LAB — GLUCOSE, CAPILLARY
Glucose-Capillary: 134 mg/dL — ABNORMAL HIGH (ref 70–99)
Glucose-Capillary: 142 mg/dL — ABNORMAL HIGH (ref 70–99)
Glucose-Capillary: 148 mg/dL — ABNORMAL HIGH (ref 70–99)
Glucose-Capillary: 163 mg/dL — ABNORMAL HIGH (ref 70–99)
Glucose-Capillary: 173 mg/dL — ABNORMAL HIGH (ref 70–99)
Glucose-Capillary: 180 mg/dL — ABNORMAL HIGH (ref 70–99)

## 2019-02-24 LAB — HEMOGLOBIN A1C
Hgb A1c MFr Bld: 7.1 % — ABNORMAL HIGH (ref 4.8–5.6)
Mean Plasma Glucose: 157.07 mg/dL

## 2019-02-24 LAB — C DIFFICILE QUICK SCREEN W PCR REFLEX
C Diff antigen: NEGATIVE
C Diff interpretation: NOT DETECTED
C Diff toxin: NEGATIVE

## 2019-02-24 LAB — MAGNESIUM: Magnesium: 1.6 mg/dL — ABNORMAL LOW (ref 1.7–2.4)

## 2019-02-24 LAB — APTT: aPTT: >200 seconds (ref 24–36)

## 2019-02-24 MED ORDER — HEPARIN (PORCINE) 25000 UT/250ML-% IV SOLN
1250.0000 [IU]/h | INTRAVENOUS | Status: DC
  Administered 2019-02-24 – 2019-02-25 (×2): 1250 [IU]/h via INTRAVENOUS
  Filled 2019-02-24: qty 250

## 2019-02-24 MED ORDER — MAGNESIUM SULFATE 2 GM/50ML IV SOLN
2.0000 g | Freq: Once | INTRAVENOUS | Status: AC
  Administered 2019-02-24: 2 g via INTRAVENOUS
  Filled 2019-02-24: qty 50

## 2019-02-24 MED ORDER — CHLORHEXIDINE GLUCONATE CLOTH 2 % EX PADS
6.0000 | MEDICATED_PAD | Freq: Every day | CUTANEOUS | Status: DC
  Administered 2019-02-24 – 2019-02-25 (×2): 6 via TOPICAL

## 2019-02-24 NOTE — Progress Notes (Signed)
[facility] for heparin Indication: pulmonary embolus  Allergies  Allergen Reactions  . Oxycodone     Blisters, hallucinations  . Tramadol     Blisters, hallucinations  . Trazodone And Nefazodone Other (See Comments)    hallucinations    Patient Measurements: Height: 6\' 2"  (188 cm) Weight: 184 lb 8.4 oz (83.7 kg) IBW/kg (Calculated) : 82.2  HEPARIN DW (KG): 83.7   Vital Signs: Temp: 98.7 F (37.1 C) (10/22 0459) Temp Source: Oral (10/22 0459) BP: 118/68 (10/22 0459) Pulse Rate: 96 (10/22 0459)  Labs: Recent Labs    02/22/19 1749 02/23/19 0404 02/23/19 1552 02/24/19 0110 02/24/19 0921  HGB 12.3* 11.2*  --  10.7*  --   HCT 39.6 35.8*  --  34.3*  --   PLT 254 231  --  250  --   APTT  --   --  51* >200*  --   HEPARINUNFRC  --   --  <0.10* 0.55 0.83*  CREATININE 0.79 0.63  --  0.66  --     Estimated Creatinine Clearance: 102.8 mL/min (by C-G formula based on SCr of 0.66 mg/dL).   Assessment:  Pharmacy consulted to dose heparin infusion for this 68 yo male for acute PE. He was taking rivaroxaban 20mg  daily at home PTA, with his last dose on 02-21-19.  Baseline CBC is WNL. Noted that baseline heparin level was undetectable so will only need to utilize heparin levels as Xarelto no longer affecting levels.  Heparin level 0.83, supratherapeutic on gtt at 1300 units/hr. No issues with bleeding.   Goal of Therapy:  Heparin level 0.3-0.7 units/ml Monitor platelets by anticoagulation protocol: Yes   Plan:  Decrease heparin infusion to 1150 units/hr Will check  antiXa level in ~6-8hr and daily Continue to monitor H&H and platelets   Isac Sarna, BS Vena [place], BCPS Clinical Pharmacist Pager [phone] 02/24/2019 10:13 AM

## 2019-02-24 NOTE — Progress Notes (Signed)
Initial Nutrition Assessment  DOCUMENTATION CODES:      INTERVENTION:  Recommend regular diet when pt is cleared by surgery for regular foods.  Glucerna Shake po TID, each supplement provides 220 kcal and 10 grams of protein   Provided Education: Hight Calorie/High Protein Nutrition Therapy   NUTRITION DIAGNOSIS:   Increased nutrient needs related to cancer and cancer related treatments as evidenced by estimated needs.   GOAL: Pt to meet >/= 90% of their estimated nutrition needs     MONITOR: PO intake, Supplement acceptance, Labs, Weight trends, Diet advancement   REASON FOR ASSESSMENT:   Consult Diet education(Patient concerned about  weight loss)  ASSESSMENT:  Patient presents with history of HTN, PE, GERD, colcn and rectal cancer s/p ileostomy 2012 and radiation therapy May/June 2012.  He is followed by [facility]. Recent chemotherapy and was seen by the RD there on 10/9. He presents with right side abdominal pain and has been assessed by surgeon.  Diet advanced yesterday morning for breakfast. PO: 75% documented. Today at lunch he has full liquid tray and has consumed his soup only. Patient says he is waiting for his wife to bring him a [employer] hamburger. In the morning his daughter is bringing a plate from [employer] eggs bacon and biscuit. Patient says "I can't eat hospital food". Maybe it is more accurate to say he prefers fast foods. He endorses Glucerna 4x daily (880 kcal, 40 gr protein) if he drinks 100% of all. He is taking Remeron (appetite).  His weight 10/7:  173.3 lb , (9/23)-wt 177 lb. Currently wt is 184.1 lb (83.7 kg) a desirable gain. His weight has be fluctuating between 75-80 kg (165-176 lb) the past 3 months.   Medications reviewed and include: SSI, Gabapentin, Remeron  Labs: BMP Latest Ref Rng & Units 02/24/2019 02/23/2019 02/22/2019  Glucose 70 - 99 mg/dL 168(H) 93 130(H)  BUN 8 - 23 mg/dL 7(L) 8 10  Creatinine 0.61 - 1.24 mg/dL 0.66 0.63  0.79  Sodium 135 - 145 mmol/L 136 137 132(L)  Potassium 3.5 - 5.1 mmol/L 3.7 3.8 3.7  Chloride 98 - 111 mmol/L 107 106 101  CO2 22 - 32 mmol/L 22 23 22   Calcium 8.9 - 10.3 mg/dL 7.7(L) 7.9(L) 8.0(L)     NUTRITION - FOCUSED PHYSICAL EXAM:    Most Recent Value  Orbital Region  Mild depletion  Upper Arm Region  Mild depletion  Temple Region  Mild depletion  Clavicle and Acromion Bone Region  Moderate depletion  Dorsal Hand  No depletion  Edema (RD Assessment)  None  Hair  Unable to assess [mostly bald]  Eyes  Reviewed  Mouth  Reviewed [multiple missing teeth upper and lower]  Skin  Reviewed  Nails  Reviewed      Diet Order:   Diet Order            Diet full liquid Room service appropriate? Yes; Fluid consistency: Thin  Diet effective now              EDUCATION NEEDS:   Education needs have been addressed(see intervention) Skin:  Skin Assessment: Reviewed RN Assessment  Last BM:  10/21  Height:   Ht Readings from Last 1 Encounters:  02/23/19 6\' 2"  (1.88 m)    Weight:   Wt Readings from Last 1 Encounters:  02/23/19 83.7 kg    Ideal Body Weight:  81 kg  BMI:  Body mass index is 23.69 kg/m.  Estimated Nutritional Needs:   Kcal:  9379-0240 (28-30 kcal/kg/bw)  Protein:  109-117 gr (1.3-1.4 gr/kg/bw)  Fluid:  2.3-2.5 liters daily    Colman Cater MS,RD,CSG,LDN Office: [phone] Pager: [phone]

## 2019-02-24 NOTE — Progress Notes (Signed)
PROGRESS NOTE  Charles [name] YQM:[id number] DOB: 21-Jul-1950 DOA: 02/22/2019 PCP: [name], MD  Brief History:  68 year old male with a history of stage IV rectal cancer with metastasis to lung and pancreas, DVT, PE, diabetes mellitus type 2, peripheral vascular disease presenting with 1 day history of right-sided abdominal pain with fever up to 100.0 F.  Patient also noted some loose stools in his ileostomy which is unusual for him.  He had denied any vomiting but had some nausea.  He denied any headache, neck pain, coughing, hemoptysis, chest pain, shortness of breath, palpitations, dysuria. In the ED, the patient had temperature up to 100.3 F, but he was hemodynamically stable with oxygen saturation 97% on room air.  WBC was up to 12.3 with lactic acid 2.7.  CT of the abdomen and pelvis showed stable right upper lobe pulmonary nodules, stable pancreatic head mass, periappendiceal inflammation with the tip of the appendix dilated 2.1 cm and possible 7 mm fluid collection adjacent to the tip of the appendix.  The patient was started on Zosyn.  General surgery was consulted.  Assessment/Plan: Sepsis -Present at the time of admission -Secondary to appendicitis -Lactic acid peaked 2.7 -Continue IV fluids -Urinalysis negative for pyuria -Continue Zosyn -Follow blood cultures--neg  Acute appendicitis -CT abdomen as discussed above -General surgery consult appreciated-->nonoperative management -diet advanced--pt tolerated hamburger from [employer] today  Stage IV rectal cancer -Last chemotherapy 02/09/2019 -Patient follows Dr. [name]  History of DVT/PE -Holding rivaroxaban secondary to anticipation for surgery -Last dose of rivaroxaban evening 02/21/2019 -pt has IVC filter  Diabetes mellitus type 2 -NovoLog sliding scale -Holding glimepiride -10/22--Hemoglobin A1c--7.1  Depression/anxiety -Continue home dose Remeron and Klonopin  Chronic pain -last  Percocet Rx 10/01/18; pt receives monthly klonopin -PMP Aware queried       Disposition Plan:   Home in 1-2 days  Family Communication:   Spouse updated at bedside 10/22  Consultants:  General surgery  Code Status:  FULL   DVT Prophylaxis:  IV heparin   Procedures: As Listed in Progress Note Above  Antibiotics: Zosyn 10/21>>>     Subjective: Pt states abd pain is significantly improved.  Denies cp, sob, n/v/d, dysuria.  Had loose stools in ostomy.  No f/c, headache, cough  Objective: Vitals:   02/23/19 1000 02/23/19 1439 02/23/19 2200 02/24/19 0459  BP:  116/80 120/74 118/68  Pulse:  83 92 96  Resp:  18 17 16   Temp:  (!) 100.4 F (38 C) 99.3 F (37.4 C) 98.7 F (37.1 C)  TempSrc:  Oral Oral Oral  SpO2: 98% 99% 99%   Weight:      Height:        Intake/Output Summary (Last 24 hours) at 02/24/2019 1725 Last data filed at 02/24/2019 0900 Gross per 24 hour  Intake 2768.66 ml  Output 1550 ml  Net 1218.66 ml   Weight change: 5.228 kg Exam:   General:  Pt is alert, follows commands appropriately, not in acute distress  HEENT: No icterus, No thrush, No neck mass, [place]/AT  Cardiovascular: RRR, S1/S2, no rubs, no gallops  Respiratory: CTA bilaterally, no wheezing, no crackles, no rhonchi  Abdomen: Soft/+BS, non tender, non distended, no guarding  Extremities: No edema, No lymphangitis, No petechiae, No rashes, no synovitis   Data Reviewed: I have personally reviewed following labs and imaging studies Basic Metabolic Panel: Recent Labs  Lab 02/22/19 1749 02/23/19 0404 02/24/19 0110  NA 132* 137  136  K 3.7 3.8 3.7  CL 101 106 107  CO2 22 23 22   GLUCOSE 130* 93 168*  BUN 10 8 7*  CREATININE 0.79 0.63 0.66  CALCIUM 8.0* 7.9* 7.7*  MG  --   --  1.6*   Liver Function Tests: Recent Labs  Lab 02/22/19 1749 02/23/19 0404  AST 14* 13*  ALT 17 15  ALKPHOS 103 95  BILITOT 0.7 0.8  PROT 5.9* 5.2*  ALBUMIN 2.9* 2.5*   Recent Labs   Lab 02/22/19 1749  LIPASE 14   No results for input(s): AMMONIA in the last 168 hours. Coagulation Profile: No results for input(s): INR, PROTIME in the last 168 hours. CBC: Recent Labs  Lab 02/22/19 1749 02/23/19 0404 02/24/19 0110  WBC 10.5 12.3* 12.8*  NEUTROABS 7.7  --   --   HGB 12.3* 11.2* 10.7*  HCT 39.6 35.8* 34.3*  MCV 93.0 93.2 91.5  PLT 254 231 250   Cardiac Enzymes: No results for input(s): CKTOTAL, CKMB, CKMBINDEX, TROPONINI in the last 168 hours. BNP: Invalid input(s): POCBNP CBG: Recent Labs  Lab 02/24/19 0001 02/24/19 0417 02/24/19 0714 02/24/19 1115 02/24/19 1623  GLUCAP 173* 134* 148* 180* 142*   HbA1C: Recent Labs    02/22/19 2003 02/24/19 0110  HGBA1C 7.1* 7.1*   Urine analysis:    Component Value Date/Time   COLORURINE AMBER (A) 02/22/2019 2013   APPEARANCEUR CLEAR 02/22/2019 2013   LABSPEC 1.026 02/22/2019 2013   PHURINE 6.0 02/22/2019 2013   [place] 02/22/2019 2013   [place] NEGATIVE 02/22/2019 2013   [place] NEGATIVE 02/22/2019 2013   [place] NEGATIVE 02/22/2019 2013   PROTEINUR 30 (A) 02/22/2019 2013   UROBILINOGEN 1.0 11/14/2014 1504   NITRITE NEGATIVE 02/22/2019 2013   LEUKOCYTESUR NEGATIVE 02/22/2019 2013   Sepsis Labs: @LABRCNTIP (procalcitonin:4,lacticidven:4) ) Recent Results (from the past 240 hour(s))  Blood culture (routine x 2)     Status: None (Preliminary result)   Collection Time: 02/22/19  6:51 PM   Specimen: BLOOD LEFT WRIST  Result Value Ref Range Status   Specimen Description BLOOD LEFT WRIST  Final   Special Requests   Final    BOTTLES DRAWN AEROBIC AND ANAEROBIC Blood Culture adequate volume   Culture   Final    NO GROWTH 2 DAYS Performed at [facility], [address] [place], [place] [address]    Report Status PENDING  Incomplete  Blood culture (routine x 2)     Status: None (Preliminary result)   Collection Time: 02/22/19  6:52 PM   Specimen: Right Antecubital; Blood  Result Value  Ref Range Status   Specimen Description RIGHT ANTECUBITAL  Final   Special Requests   Final    BOTTLES DRAWN AEROBIC AND ANAEROBIC Blood Culture adequate volume   Culture   Final    NO GROWTH 2 DAYS Performed at [facility], [address], [place], [place] [address]    Report Status PENDING  Incomplete  Urine culture     Status: Abnormal   Collection Time: 02/22/19  8:13 PM   Specimen: Urine, Clean Catch  Result Value Ref Range Status   Specimen Description   Final    URINE, CLEAN CATCH Performed at [facility], [address] [place], [place] [address]    Special Requests   Final    NONE Performed at [facility], [address] [place], [place] [address]    Culture MULTIPLE SPECIES PRESENT, SUGGEST RECOLLECTION (A)  Final   Report Status  02/24/2019 FINAL  Final  SARS Coronavirus 2 by RT PCR (hospital order, performed in [facility] hospital lab) Nasopharyngeal Nasopharyngeal Swab     Status: None   Collection Time: 02/22/19 10:22 PM   Specimen: Nasopharyngeal Swab  Result Value Ref Range Status   SARS Coronavirus 2 NEGATIVE NEGATIVE Final    Comment: (NOTE) If result is NEGATIVE SARS-CoV-2 target nucleic acids are NOT DETECTED. The SARS-CoV-2 RNA is generally detectable in upper and lower  respiratory specimens during the acute phase of infection. The lowest  concentration of SARS-CoV-2 viral copies this assay can detect is 250  copies / mL. A negative result does not preclude SARS-CoV-2 infection  and should not be used as the sole basis for treatment or other  patient management decisions.  A negative result may occur with  improper specimen collection / handling, submission of specimen other  than nasopharyngeal swab, presence of viral mutation(s) within the  areas targeted by this assay, and inadequate number of viral copies  (<250 copies / mL). A negative result must be combined with clinical  observations, patient history, and epidemiological information. If result  is POSITIVE SARS-CoV-2 target nucleic acids are DETECTED. The SARS-CoV-2 RNA is generally detectable in upper and lower  respiratory specimens dur ing the acute phase of infection.  Positive  results are indicative of active infection with SARS-CoV-2.  Clinical  correlation with patient history and other diagnostic information is  necessary to determine patient infection status.  Positive results do  not rule out bacterial infection or co-infection with other viruses. If result is PRESUMPTIVE POSTIVE SARS-CoV-2 nucleic acids MAY BE PRESENT.   A presumptive positive result was obtained on the submitted specimen  and confirmed on repeat testing.  While 2019 novel coronavirus  (SARS-CoV-2) nucleic acids may be present in the submitted sample  additional confirmatory testing may be necessary for epidemiological  and / or clinical management purposes  to differentiate between  SARS-CoV-2 and other Sarbecovirus currently known to infect humans.  If clinically indicated additional testing with an alternate test  methodology [phone]) is advised. The SARS-CoV-2 RNA is generally  detectable in upper and lower respiratory sp ecimens during the acute  phase of infection. The expected result is Negative. Fact Sheet for Patients:  StrictlyIdeas.no Fact Sheet for Healthcare Providers: BankingDealers.co.za This test is not yet approved or cleared by the [place] FDA and has been authorized for detection and/or diagnosis of SARS-CoV-2 by FDA under an Emergency Use Authorization (EUA).  This EUA will remain in effect (meaning this test can be used) for the duration of the COVID-19 declaration under Section 564(b)(1) of the Act, 21 U.S.C. section 360bbb-3(b)(1), unless the authorization is terminated or revoked sooner. Performed at [facility], [address], [place], [place] [address]   C difficile quick scan w PCR reflex     Status: None    Collection Time: 02/23/19 10:45 PM   Specimen: STOOL  Result Value Ref Range Status   C Diff antigen NEGATIVE NEGATIVE Final   C Diff toxin NEGATIVE NEGATIVE Final   C Diff interpretation No C. difficile detected.  Final    Comment: Performed at [facility], [address], [place], [place] [address]     Scheduled Meds: . Chlorhexidine Gluconate Cloth  6 each Topical Daily  . feeding supplement (GLUCERNA SHAKE)  237 mL Oral TID BM  . gabapentin  900 mg Oral BID  . insulin aspart  0-9 Units Subcutaneous TID WC  . mirtazapine  7.5 mg Oral QHS   Continuous Infusions: . sodium chloride 100 mL/hr at 02/24/19 0957  . heparin 1,150 Units/hr (02/24/19 1031)  . piperacillin-tazobactam (ZOSYN)  IV 3.375 g (02/24/19 1118)    Procedures/Studies: Ct Chest W Contrast  Result Date: 02/22/2019 CLINICAL DATA:  Nausea vomiting diarrhea history of rectal cancer with metastatic disease EXAM: CT CHEST, ABDOMEN, AND PELVIS WITH CONTRAST TECHNIQUE: Multidetector CT imaging of the chest, abdomen and pelvis was performed following the standard protocol during bolus administration of intravenous contrast. CONTRAST:  120mL OMNIPAQUE IOHEXOL 300 MG/ML  SOLN COMPARISON:  CT 09/22/2018, 07/30/2018, 05/28/2018 FINDINGS: CT CHEST FINDINGS Cardiovascular: Nonaneurysmal aorta. Mild aortic atherosclerosis. Coronary vascular calcification. Normal heart size. No pericardial effusion. Right-sided central venous port with tip at the distal SVC. Mediastinum/Nodes: Midline trachea. No thyroid mass. No increasing mediastinal adenopathy. Esophagus within normal limits. Lungs/Pleura: No acute consolidation or pleural effusion. Stable 6 mm anterior right upper lobe pulmonary nodule, series 3, image number 106, previously 6 mm. Posterior right upper lobe pulmonary nodule, series 3, image number 41 measures 11 mm, previously 11 mm. Posterior right upper lobe 11 mm pulmonary nodule, series 3, image number 75, previously 13 mm. No new  pulmonary nodule. Right middle lobe postsurgical changes Musculoskeletal: Stable mixed sclerotic and lucent lesion in the lower sternum. Degenerative changes of the spine. CT ABDOMEN PELVIS FINDINGS Hepatobiliary: Contracted gallbladder without calcified stone. No focal hepatic abnormality. No ductal dilatation Pancreas: Diffusely atrophic. Diffuse pancreatic duct dilatation with truncation at the pancreatic head. Stable 2.1 cm mass at the head of the pancreas. Spleen: Normal in size without focal abnormality. Adrenals/Urinary Tract: Left adrenal calcification. No adrenal mass. Kidneys show no hydronephrosis. Stable subcentimeter exophytic slightly dense lesion off the lower pole left kidney. The bladder is slightly thick walled. Stomach/Bowel: The stomach is nonenlarged. No dilated small bowel. Status post partial proctocolectomy decreased rectal wall thickening compared to most recent prior. Interval left lower quadrant loop colostomy. Abnormal appendix. Small appendicoliths. Considerable periappendiceal inflammatory change. Appendix tip dilated up to 2.1 cm. No extraluminal gas. Possible tiny 7 mm fluid collection adjacent to tip of appendix. Vascular/Lymphatic: IVC filter with tip just below the renal vein confluence. Similar protrusion struts beyond the IVC lumen. Nonaneurysmal aorta with mild aortic atherosclerosis. Subcentimeter lymph nodes in the right lower quadrant could be reactive. Reproductive: Prostate calcification Other: Mild presacral soft tissue stranding. No free fluid. No significant pelvic effusion Musculoskeletal: No acute or suspicious osseous abnormality IMPRESSION: 1. Findings consistent with acute appendicitis. No definitive extraluminal gas though considerable inflammatory change about the appendix and possible tiny 7 mm tiny fluid collection adjacent to the tip of the appendix. Appendix: Location: Right lower quadrant Diameter: Up to 21 mm Appendicolith: Positive for stone near the tip  Mucosal hyper-enhancement: Negative Extraluminal gas: Negative Periappendiceal collection: Possible tiny 7 mm fluid collection adjacent to the tip of the appendix. 2. Status post partial proctocolectomy with interval left lower quadrant loop colostomy. Decreased rectal wall thickening. 3.  Stable right upper lobe pulmonary nodules. 4. Stable 2 cm mass at the head of pancreas with atrophy ductal obstruction distal to this 5. Stable mixed lucent and sclerotic lesion involving the lower sternum Electronically Signed   By: Donavan Foil M.D.   On: 02/22/2019 21:40   Ct Abdomen Pelvis W Contrast  Result Date: 02/22/2019 CLINICAL DATA:  Nausea vomiting diarrhea history of rectal cancer with metastatic disease EXAM: CT CHEST, ABDOMEN, AND PELVIS WITH CONTRAST TECHNIQUE: Multidetector CT imaging of the chest, abdomen and  pelvis was performed following the standard protocol during bolus administration of intravenous contrast. CONTRAST:  117mL OMNIPAQUE IOHEXOL 300 MG/ML  SOLN COMPARISON:  CT 09/22/2018, 07/30/2018, 05/28/2018 FINDINGS: CT CHEST FINDINGS Cardiovascular: Nonaneurysmal aorta. Mild aortic atherosclerosis. Coronary vascular calcification. Normal heart size. No pericardial effusion. Right-sided central venous port with tip at the distal SVC. Mediastinum/Nodes: Midline trachea. No thyroid mass. No increasing mediastinal adenopathy. Esophagus within normal limits. Lungs/Pleura: No acute consolidation or pleural effusion. Stable 6 mm anterior right upper lobe pulmonary nodule, series 3, image number 106, previously 6 mm. Posterior right upper lobe pulmonary nodule, series 3, image number 41 measures 11 mm, previously 11 mm. Posterior right upper lobe 11 mm pulmonary nodule, series 3, image number 75, previously 13 mm. No new pulmonary nodule. Right middle lobe postsurgical changes Musculoskeletal: Stable mixed sclerotic and lucent lesion in the lower sternum. Degenerative changes of the spine. CT ABDOMEN PELVIS  FINDINGS Hepatobiliary: Contracted gallbladder without calcified stone. No focal hepatic abnormality. No ductal dilatation Pancreas: Diffusely atrophic. Diffuse pancreatic duct dilatation with truncation at the pancreatic head. Stable 2.1 cm mass at the head of the pancreas. Spleen: Normal in size without focal abnormality. Adrenals/Urinary Tract: Left adrenal calcification. No adrenal mass. Kidneys show no hydronephrosis. Stable subcentimeter exophytic slightly dense lesion off the lower pole left kidney. The bladder is slightly thick walled. Stomach/Bowel: The stomach is nonenlarged. No dilated small bowel. Status post partial proctocolectomy decreased rectal wall thickening compared to most recent prior. Interval left lower quadrant loop colostomy. Abnormal appendix. Small appendicoliths. Considerable periappendiceal inflammatory change. Appendix tip dilated up to 2.1 cm. No extraluminal gas. Possible tiny 7 mm fluid collection adjacent to tip of appendix. Vascular/Lymphatic: IVC filter with tip just below the renal vein confluence. Similar protrusion struts beyond the IVC lumen. Nonaneurysmal aorta with mild aortic atherosclerosis. Subcentimeter lymph nodes in the right lower quadrant could be reactive. Reproductive: Prostate calcification Other: Mild presacral soft tissue stranding. No free fluid. No significant pelvic effusion Musculoskeletal: No acute or suspicious osseous abnormality IMPRESSION: 1. Findings consistent with acute appendicitis. No definitive extraluminal gas though considerable inflammatory change about the appendix and possible tiny 7 mm tiny fluid collection adjacent to the tip of the appendix. Appendix: Location: Right lower quadrant Diameter: Up to 21 mm Appendicolith: Positive for stone near the tip Mucosal hyper-enhancement: Negative Extraluminal gas: Negative Periappendiceal collection: Possible tiny 7 mm fluid collection adjacent to the tip of the appendix. 2. Status post partial  proctocolectomy with interval left lower quadrant loop colostomy. Decreased rectal wall thickening. 3.  Stable right upper lobe pulmonary nodules. 4. Stable 2 cm mass at the head of pancreas with atrophy ductal obstruction distal to this 5. Stable mixed lucent and sclerotic lesion involving the lower sternum Electronically Signed   By: Donavan Foil M.D.   On: 02/22/2019 21:40    Orson Eva, DO  [facility] Pager [phone]  If 7PM-7AM, please contact night-coverage [email or web address] Password [email or web address] 02/24/2019, 5:25 PM   LOS: 1 day

## 2019-02-24 NOTE — TOC Initial Note (Signed)
Transition of Care [facility]) - Initial/Assessment Note    Patient Details  Name: [name] MRN: [id number] Date of Birth: 1951-02-22  Transition of Care [facility]) CM/SW Contact:    Charles [name], Charles [place], RN Phone Number: 02/24/2019, 1:15 PM  Clinical Narrative:  "  65-y.o., male with a history of stage IV rectal cancer with metastasis to lung and pancreas, DVT, PE, diabetes mellitus type 2, peripheral vascular disease presenting with 1 day history of right-sided abdominal pain with fever up to 100.0 F.  Patient also noted some loose stools in his ileostomy which is unusual for him.     "  High risk readmission score related to number of active RX's and three ED visits in six months.  Patient's last chemotherapy 02/09/2019. He has treatments at [facility] with Dr. Delton [name].  Patient is here with appendicitis that is being managed conservatively. He is from home with wife. Independent. No current home health. Has [facility] for nursing services. He does not feel he needs RN services again.   Has PCP, transportation, and no issues obtaining medications.          Expected Discharge Plan: Home/Self Care Barriers to Discharge: Continued Medical Work up   Patient Goals and CMS Choice Patient states their goals for this hospitalization and ongoing recovery are:: feel better and return home      Expected Discharge Plan and Services Expected Discharge Plan: Home/Self Care   Discharge Planning Services: CM Consult   Living arrangements for the past 2 months: Single Family Home                     Prior Living Arrangements/Services Living arrangements for the past 2 months: Single Family Home Lives with:: Spouse Patient language and need for interpreter reviewed:: Yes        Need for Family Participation in Patient Care: No (Comment) Care giver support system in place?: Yes (comment)   Criminal Activity/Legal Involvement Pertinent to Current  Situation/Hospitalization: No - Comment as needed  Activities of Daily Living Home Assistive Devices/Equipment: None ADL Screening (condition at time of admission) Patient's cognitive ability adequate to safely complete daily activities?: Yes Is the patient deaf or have difficulty hearing?: No Does the patient have difficulty seeing, even when wearing glasses/contacts?: No Does the patient have difficulty concentrating, remembering, or making decisions?: No Patient able to express need for assistance with ADLs?: No Does the patient have difficulty dressing or bathing?: No Independently performs ADLs?: Yes (appropriate for developmental age) Does the patient have difficulty walking or climbing stairs?: No Weakness of Legs: None Weakness of Arms/Hands: None  Permission Sought/Granted                  Emotional Assessment   Attitude/Demeanor/Rapport: Engaged Affect (typically observed): Accepting Orientation: : Oriented to Self, Oriented to Place, Oriented to  Time Alcohol / Substance Use: Not Applicable Psych Involvement: No (comment)  Admission diagnosis:  SIRS (systemic inflammatory response syndrome) (HCC) [R65.10] Acute appendicitis, unspecified acute appendicitis type [K35.80] Patient Active Problem List   Diagnosis Date Noted  . Appendicitis 02/23/2019  . Sepsis due to undetermined organism ([place]) 02/23/2019  . Nausea, vomiting and diarrhea   . SIRS (systemic inflammatory response syndrome) ([place]) 10/16/2018  . Metastatic colorectal cancer ([place])   . Anastomotic stricture of colorectal region   . Abnormal CT scan, colon   . Diarrhea in adult patient 09/22/2018  . History of pulmonary embolism 09/22/2018  .  Hypokalemia 09/22/2018  . Type 2 diabetes mellitus ([place]) 09/22/2018  . Hypomagnesemia 09/22/2018  . Dilated gallbladder 09/22/2018  . Goals of care, counseling/discussion 09/20/2018  . Fecal incontinence 09/23/2017  . Community acquired pneumonia of right lower  lobe of lung 07/23/2016  . Abnormal pancreas function test   . Rectal pain 05/30/2016  . Hiatal hernia   . Schatzki's ring   . GI bleed 11/23/2014  . Gastritis 11/23/2014  . S/P thoracotomy 11/23/2014  . S/P partial lobectomy of lung 11/15/2014  . DVT (deep venous thrombosis) ([place])   . Lung nodule   . Lung nodule, solitary   . Acute pulmonary embolism ([place]) 09/08/2014  . Lactic acidosis 09/08/2014  . Lower abdominal pain 09/08/2014  . Urinary retention 09/08/2014  . Diarrhea 08/01/2014  . 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 Stiffness of joint, not elsewhere classified, ankle and foot 01/31/2014  . Weakness of both legs 01/31/2014  . Hx of radiation therapy   . Dehydration 03/10/2011  . Rectal cancer ([place]) 01/15/2011  . Colon cancer ([place]) 07/24/2010  . GERD 07/11/2010   PCP:  Charles Chroman, MD Pharmacy:   [name] [address], [place] [address] Phone: [phone] Fax: [place], [place] [address] Phone: [phone] Fax: [phone]  [place], [place] [address] [place] [address] Phone: [phone] Fax: [phone]  [place], [place] - [place] [address] Phone: [phone] Fax: [phone]     Social Determinants of Health (SDOH) Interventions    Readmission Risk Interventions Readmission Risk Prevention Plan 02/24/2019 09/24/2018  Transportation Screening Complete Complete  HRI or [place] Not Complete Complete  HRI or Home Care Consult comments declined -  Social Work Consult for [place] Planning/Counseling Complete Complete  Palliative Care Screening Not Applicable Not Applicable  Medication Review Press photographer) Complete Complete  Some recent data might be hidden

## 2019-02-24 NOTE — Progress Notes (Signed)
PTT > 200,  Pharmacy notified. No new orders at this time.

## 2019-02-24 NOTE — Progress Notes (Signed)
[facility] Progress Note     Subjective: No major complaints. Having some pain but doing ok. Tolerating fulls. Had multiple loose BMs.   Objective: Vital signs in last 24 hours: Temp:  [98.7 F (37.1 C)-99.3 F (37.4 C)] 98.7 F (37.1 C) (10/22 0459) Pulse Rate:  [92-96] 96 (10/22 0459) Resp:  [16-17] 16 (10/22 0459) BP: (118-120)/(68-74) 118/68 (10/22 0459) SpO2:  [99 %] 99 % (10/21 2200) Last BM Date: 02/23/19  Intake/Output from previous day: 10/21 0701 - 10/22 0700 In: 2648.7 [P.O.:120; I.V.:2428.7; IV Piggyback:100] Out: 2250 [Urine:1300; Stool:950] Intake/Output this shift: Total I/O In: 240 [P.O.:240] Out: -   General appearance: alert, cooperative and no distress Resp: normal work of breathing GI: soft, mildly distended, mildly tender RLQ  Lab Results:  Recent Labs    02/23/19 0404 02/24/19 0110  WBC 12.3* 12.8*  HGB 11.2* 10.7*  HCT 35.8* 34.3*  PLT 231 250   BMET Recent Labs    02/23/19 0404 02/24/19 0110  NA 137 136  K 3.8 3.7  CL 106 107  CO2 23 22  GLUCOSE 93 168*  BUN 8 7*  CREATININE 0.63 0.66  CALCIUM 7.9* 7.7*   PT/INR No results for input(s): LABPROT, INR in the last 72 hours.  Studies/Results: Ct Chest W Contrast  Result Date: 02/22/2019 CLINICAL DATA:  Nausea vomiting diarrhea history of rectal cancer with metastatic disease EXAM: CT CHEST, ABDOMEN, AND PELVIS WITH CONTRAST TECHNIQUE: Multidetector CT imaging of the chest, abdomen and pelvis was performed following the standard protocol during bolus administration of intravenous contrast. CONTRAST:  128mL OMNIPAQUE IOHEXOL 300 MG/ML  SOLN COMPARISON:  CT 09/22/2018, 07/30/2018, 05/28/2018 FINDINGS: CT CHEST FINDINGS Cardiovascular: Nonaneurysmal aorta. Mild aortic atherosclerosis. Coronary vascular calcification. Normal heart size. No pericardial effusion. Right-sided central venous port with tip at the distal SVC. Mediastinum/Nodes: Midline trachea. No thyroid  mass. No increasing mediastinal adenopathy. Esophagus within normal limits. Lungs/Pleura: No acute consolidation or pleural effusion. Stable 6 mm anterior right upper lobe pulmonary nodule, series 3, image number 106, previously 6 mm. Posterior right upper lobe pulmonary nodule, series 3, image number 41 measures 11 mm, previously 11 mm. Posterior right upper lobe 11 mm pulmonary nodule, series 3, image number 75, previously 13 mm. No new pulmonary nodule. Right middle lobe postsurgical changes Musculoskeletal: Stable mixed sclerotic and lucent lesion in the lower sternum. Degenerative changes of the spine. CT ABDOMEN PELVIS FINDINGS Hepatobiliary: Contracted gallbladder without calcified stone. No focal hepatic abnormality. No ductal dilatation Pancreas: Diffusely atrophic. Diffuse pancreatic duct dilatation with truncation at the pancreatic head. Stable 2.1 cm mass at the head of the pancreas. Spleen: Normal in size without focal abnormality. Adrenals/Urinary Tract: Left adrenal calcification. No adrenal mass. Kidneys show no hydronephrosis. Stable subcentimeter exophytic slightly dense lesion off the lower pole left kidney. The bladder is slightly thick walled. Stomach/Bowel: The stomach is nonenlarged. No dilated small bowel. Status post partial proctocolectomy decreased rectal wall thickening compared to most recent prior. Interval left lower quadrant loop colostomy. Abnormal appendix. Small appendicoliths. Considerable periappendiceal inflammatory change. Appendix tip dilated up to 2.1 cm. No extraluminal gas. Possible tiny 7 mm fluid collection adjacent to tip of appendix. Vascular/Lymphatic: IVC filter with tip just below the renal vein confluence. Similar protrusion struts beyond the IVC lumen. Nonaneurysmal aorta with mild aortic atherosclerosis. Subcentimeter lymph nodes in the right lower quadrant could be reactive. Reproductive: Prostate calcification Other: Mild presacral soft tissue stranding. No free  fluid. No significant pelvic effusion Musculoskeletal: No acute  or suspicious osseous abnormality IMPRESSION: 1. Findings consistent with acute appendicitis. No definitive extraluminal gas though considerable inflammatory change about the appendix and possible tiny 7 mm tiny fluid collection adjacent to the tip of the appendix. Appendix: Location: Right lower quadrant Diameter: Up to 21 mm Appendicolith: Positive for stone near the tip Mucosal hyper-enhancement: Negative Extraluminal gas: Negative Periappendiceal collection: Possible tiny 7 mm fluid collection adjacent to the tip of the appendix. 2. Status post partial proctocolectomy with interval left lower quadrant loop colostomy. Decreased rectal wall thickening. 3.  Stable right upper lobe pulmonary nodules. 4. Stable 2 cm mass at the head of pancreas with atrophy ductal obstruction distal to this 5. Stable mixed lucent and sclerotic lesion involving the lower sternum Electronically Signed   By: Donavan Foil M.D.   On: 02/22/2019 21:40   Ct Abdomen Pelvis W Contrast  Result Date: 02/22/2019 CLINICAL DATA:  Nausea vomiting diarrhea history of rectal cancer with metastatic disease EXAM: CT CHEST, ABDOMEN, AND PELVIS WITH CONTRAST TECHNIQUE: Multidetector CT imaging of the chest, abdomen and pelvis was performed following the standard protocol during bolus administration of intravenous contrast. CONTRAST:  176mL OMNIPAQUE IOHEXOL 300 MG/ML  SOLN COMPARISON:  CT 09/22/2018, 07/30/2018, 05/28/2018 FINDINGS: CT CHEST FINDINGS Cardiovascular: Nonaneurysmal aorta. Mild aortic atherosclerosis. Coronary vascular calcification. Normal heart size. No pericardial effusion. Right-sided central venous port with tip at the distal SVC. Mediastinum/Nodes: Midline trachea. No thyroid mass. No increasing mediastinal adenopathy. Esophagus within normal limits. Lungs/Pleura: No acute consolidation or pleural effusion. Stable 6 mm anterior right upper lobe pulmonary nodule,  series 3, image number 106, previously 6 mm. Posterior right upper lobe pulmonary nodule, series 3, image number 41 measures 11 mm, previously 11 mm. Posterior right upper lobe 11 mm pulmonary nodule, series 3, image number 75, previously 13 mm. No new pulmonary nodule. Right middle lobe postsurgical changes Musculoskeletal: Stable mixed sclerotic and lucent lesion in the lower sternum. Degenerative changes of the spine. CT ABDOMEN PELVIS FINDINGS Hepatobiliary: Contracted gallbladder without calcified stone. No focal hepatic abnormality. No ductal dilatation Pancreas: Diffusely atrophic. Diffuse pancreatic duct dilatation with truncation at the pancreatic head. Stable 2.1 cm mass at the head of the pancreas. Spleen: Normal in size without focal abnormality. Adrenals/Urinary Tract: Left adrenal calcification. No adrenal mass. Kidneys show no hydronephrosis. Stable subcentimeter exophytic slightly dense lesion off the lower pole left kidney. The bladder is slightly thick walled. Stomach/Bowel: The stomach is nonenlarged. No dilated small bowel. Status post partial proctocolectomy decreased rectal wall thickening compared to most recent prior. Interval left lower quadrant loop colostomy. Abnormal appendix. Small appendicoliths. Considerable periappendiceal inflammatory change. Appendix tip dilated up to 2.1 cm. No extraluminal gas. Possible tiny 7 mm fluid collection adjacent to tip of appendix. Vascular/Lymphatic: IVC filter with tip just below the renal vein confluence. Similar protrusion struts beyond the IVC lumen. Nonaneurysmal aorta with mild aortic atherosclerosis. Subcentimeter lymph nodes in the right lower quadrant could be reactive. Reproductive: Prostate calcification Other: Mild presacral soft tissue stranding. No free fluid. No significant pelvic effusion Musculoskeletal: No acute or suspicious osseous abnormality IMPRESSION: 1. Findings consistent with acute appendicitis. No definitive extraluminal gas  though considerable inflammatory change about the appendix and possible tiny 7 mm tiny fluid collection adjacent to the tip of the appendix. Appendix: Location: Right lower quadrant Diameter: Up to 21 mm Appendicolith: Positive for stone near the tip Mucosal hyper-enhancement: Negative Extraluminal gas: Negative Periappendiceal collection: Possible tiny 7 mm fluid collection adjacent to the tip of the appendix.  2. Status post partial proctocolectomy with interval left lower quadrant loop colostomy. Decreased rectal wall thickening. 3.  Stable right upper lobe pulmonary nodules. 4. Stable 2 cm mass at the head of pancreas with atrophy ductal obstruction distal to this 5. Stable mixed lucent and sclerotic lesion involving the lower sternum Electronically Signed   By: Donavan Foil M.D.   On: 02/22/2019 21:40    Anti-infectives: Anti-infectives (From admission, onward)   Start     Dose/Rate Route Frequency Ordered Stop   02/23/19 1200  piperacillin-tazobactam (ZOSYN) IVPB 3.375 g     3.375 g 12.5 mL/hr over 240 Minutes Intravenous Every 8 hours 02/23/19 0432     02/23/19 1000  vancomycin (VANCOCIN) 1,250 mg in sodium chloride 0.9 % 250 mL IVPB  Status:  Discontinued     1,250 mg 166.7 mL/hr over 90 Minutes Intravenous Every 12 hours 02/22/19 1933 02/23/19 0725   02/23/19 1000  Vancomycin (VANCOCIN) 1,250 mg in sodium chloride 0.9 % 250 mL IVPB  Status:  Discontinued     1,250 mg 166.7 mL/hr over 90 Minutes Intravenous Every 12 hours 02/23/19 0725 02/23/19 1625   02/23/19 0600  piperacillin-tazobactam (ZOSYN) IVPB 2.25 g  Status:  Discontinued     2.25 g 100 mL/hr over 30 Minutes Intravenous  Once 02/22/19 2315 02/22/19 2340   02/23/19 0600  piperacillin-tazobactam (ZOSYN) IVPB 3.375 g     3.375 g 100 mL/hr over 30 Minutes Intravenous 30 min pre-op 02/22/19 2341 02/23/19 0926   02/23/19 0400  ceFEPIme (MAXIPIME) 2 g in sodium chloride 0.9 % 100 mL IVPB  Status:  Discontinued     2 g 200 mL/hr over  30 Minutes Intravenous Every 8 hours 02/22/19 1933 02/23/19 0340   02/22/19 1815  vancomycin (VANCOCIN) 1,500 mg in sodium chloride 0.9 % 500 mL IVPB     1,500 mg 250 mL/hr over 120 Minutes Intravenous  Once 02/22/19 1810 02/22/19 2238   02/22/19 1800  ceFEPIme (MAXIPIME) 2 g in sodium chloride 0.9 % 100 mL IVPB     2 g 200 mL/hr over 30 Minutes Intravenous  Once 02/22/19 1753 02/22/19 1946   02/22/19 1800  metroNIDAZOLE (FLAGYL) IVPB 500 mg     500 mg 100 mL/hr over 60 Minutes Intravenous  Once 02/22/19 1753 02/22/19 2013   02/22/19 1800  vancomycin (VANCOCIN) IVPB 1000 mg/200 mL premix  Status:  Discontinued     1,000 mg 200 mL/hr over 60 Minutes Intravenous  Once 02/22/19 1753 02/22/19 1810      Assessment/Plan: Charles [name] is a 68 yo with a acute appendicitis with possible rupture and significant inflammation in the setting of chemotherapy for metastatic rectal cancer. Doing fair on antibiotic treatment. Continue IV Zosyn, will want him to get 4-5 days of IV and then transition to oral Diabetic diet ordered Will follow    LOS: 1 day    Virl Cagey 02/24/2019

## 2019-02-24 NOTE — Progress Notes (Signed)
[facility] for heparin Indication: pulmonary embolus  Allergies  Allergen Reactions  . Oxycodone     Blisters, hallucinations  . Tramadol     Blisters, hallucinations  . Trazodone And Nefazodone Other (See Comments)    hallucinations    Patient Measurements: Height: 6\' 2"  (188 cm) Weight: 184 lb 8.4 oz (83.7 kg) IBW/kg (Calculated) : 82.2 Heparin Dosing Weight: HEPARIN DW (KG): 83.7   Vital Signs: Temp: 99.3 F (37.4 C) (10/21 2200) Temp Source: Oral (10/21 2200) BP: 120/74 (10/21 2200) Pulse Rate: 92 (10/21 2200)  Labs: Recent Labs    02/22/19 1749 02/23/19 0404 02/23/19 1552 02/24/19 0110  HGB 12.3* 11.2*  --  10.7*  HCT 39.6 35.8*  --  34.3*  PLT 254 231  --  250  APTT  --   --  51* >200*  HEPARINUNFRC  --   --  <0.10* 0.55  CREATININE 0.79 0.63  --  0.66    Estimated Creatinine Clearance: 102.8 mL/min (by C-G formula based on SCr of 0.66 mg/dL).   Assessment:  Pharmacy consulted to dose heparin infusion for this 68 yo male for acute PE. He was taking rivaroxaban 20mg  daily at home PTA, with his last dose on 02-21-19.  Baseline CBC is WNL. Noted that baseline heparin level was undetectable so will only need to utilize heparin levels as Xarelto no longer affecting levels.  Heparin level 0.55 (therapeutic) on gtt at 1300 units/hr. Of note, PTT >200 sec but not as accurate as heparin level so unsure why this level was elevated. Spoke with RN and no issues with bleeding.   Goal of Therapy:  Heparin level 0.3-0.7 units/ml Monitor platelets by anticoagulation protocol: Yes   Plan:  Continue heparin infusion at 1300 units/hr Will check 6 hour confirmatory heparin level. No further PTTs needed. Continue to monitor H&H and platelets    Sherlon Handing, PharmD, BCPS 02/24/2019 2:43 AM

## 2019-02-24 NOTE — Progress Notes (Signed)
[place] for heparin Indication: pulmonary embolus  Allergies  Allergen Reactions  . Oxycodone     Blisters, hallucinations  . Tramadol     Blisters, hallucinations  . Trazodone And Nefazodone Other (See Comments)    hallucinations    Patient Measurements: Height: 6\' 2"  (188 cm) Weight: 184 lb 8.4 oz (83.7 kg) IBW/kg (Calculated) : 82.2  HEPARIN DW (KG): 83.7   Vital Signs:    Labs: Recent Labs    02/22/19 1749 02/23/19 0404  02/23/19 1552 02/24/19 0110 02/24/19 0921 02/24/19 1735  HGB 12.3* 11.2*  --   --  10.7*  --   --   HCT 39.6 35.8*  --   --  34.3*  --   --   PLT 254 231  --   --  250  --   --   APTT  --   --   --  51* >200*  --   --   HEPARINUNFRC  --   --    < > <0.10* 0.55 0.83* 0.19*  CREATININE 0.79 0.63  --   --  0.66  --   --    < > = values in this interval not displayed.    Estimated Creatinine Clearance: 102.8 mL/min (by C-G formula based on SCr of 0.66 mg/dL).   Assessment:  Pharmacy consulted to dose heparin infusion for this 68 yo male for acute PE. He was taking rivaroxaban 20mg  daily at home PTA, with his last dose on 02-21-19.  Baseline CBC is WNL. Noted that baseline heparin level was undetectable so will only need to utilize heparin levels as Xarelto no longer affecting levels.  6:44 PM Heparin level 0.19, subtherapeutic after rate decreased to 1150 units/hr. Per discussion with RN, no complications with IV site, heparin has not been off and no issues with bleeding.   Goal of Therapy:  Heparin level 0.3-0.7 units/ml Monitor platelets by anticoagulation protocol: Yes   Plan:  Increase heparin infusion to 1250 units/hr Will check  antiXa level in 6hr and daily Continue to monitor H&H and platelets   Peggyann Juba, PharmD, BCPS 02/24/2019 6:40 PM

## 2019-02-25 ENCOUNTER — Encounter (HOSPITAL_COMMUNITY): Payer: Medicare Other

## 2019-02-25 LAB — GLUCOSE, CAPILLARY
Glucose-Capillary: 133 mg/dL — ABNORMAL HIGH (ref 70–99)
Glucose-Capillary: 140 mg/dL — ABNORMAL HIGH (ref 70–99)
Glucose-Capillary: 146 mg/dL — ABNORMAL HIGH (ref 70–99)
Glucose-Capillary: 157 mg/dL — ABNORMAL HIGH (ref 70–99)
Glucose-Capillary: 163 mg/dL — ABNORMAL HIGH (ref 70–99)
Glucose-Capillary: 203 mg/dL — ABNORMAL HIGH (ref 70–99)

## 2019-02-25 LAB — CBC
HCT: 33.9 % — ABNORMAL LOW (ref 39.0–52.0)
Hemoglobin: 10.6 g/dL — ABNORMAL LOW (ref 13.0–17.0)
MCH: 28.9 pg (ref 26.0–34.0)
MCHC: 31.3 g/dL (ref 30.0–36.0)
MCV: 92.4 fL (ref 80.0–100.0)
Platelets: 290 10*3/uL (ref 150–400)
RBC: 3.67 MIL/uL — ABNORMAL LOW (ref 4.22–5.81)
RDW: 14.5 % (ref 11.5–15.5)
WBC: 12.5 10*3/uL — ABNORMAL HIGH (ref 4.0–10.5)
nRBC: 0 % (ref 0.0–0.2)

## 2019-02-25 LAB — BASIC METABOLIC PANEL
Anion gap: 8 (ref 5–15)
BUN: 6 mg/dL — ABNORMAL LOW (ref 8–23)
CO2: 23 mmol/L (ref 22–32)
Calcium: 7.8 mg/dL — ABNORMAL LOW (ref 8.9–10.3)
Chloride: 107 mmol/L (ref 98–111)
Creatinine, Ser: 0.75 mg/dL (ref 0.61–1.24)
GFR calc Af Amer: >60 mL/min (ref >60)
GFR calc non Af Amer: >60 mL/min (ref >60)
Glucose, Bld: 134 mg/dL — ABNORMAL HIGH (ref 70–99)
Potassium: 3.6 mmol/L (ref 3.5–5.1)
Sodium: 138 mmol/L (ref 135–145)

## 2019-02-25 LAB — HEPARIN LEVEL (UNFRACTIONATED)
Heparin Unfractionated: 0.54 IU/mL (ref 0.30–0.70)
Heparin Unfractionated: 0.62 IU/mL (ref 0.30–0.70)

## 2019-02-25 LAB — MAGNESIUM: Magnesium: 2.1 mg/dL (ref 1.7–2.4)

## 2019-02-25 MED ORDER — PANTOPRAZOLE SODIUM 40 MG PO TBEC
40.0000 mg | DELAYED_RELEASE_TABLET | Freq: Every day | ORAL | Status: DC
  Administered 2019-02-25 – 2019-02-26 (×2): 40 mg via ORAL
  Filled 2019-02-25 (×2): qty 1

## 2019-02-25 MED ORDER — CLONAZEPAM 0.5 MG PO TABS
0.5000 mg | ORAL_TABLET | Freq: Two times a day (BID) | ORAL | Status: DC
  Administered 2019-02-25 – 2019-02-26 (×2): 0.5 mg via ORAL
  Filled 2019-02-25 (×2): qty 1

## 2019-02-25 NOTE — Care Management Important Message (Signed)
Important Message  Patient Details  Name: Charles [name] MRN: [id number] Date of Birth: 04-24-1951   Medicare Important Message Given:  Yes    Tommy Medal 02/25/2019, 12:01 PM

## 2019-02-25 NOTE — Progress Notes (Signed)
PROGRESS NOTE  Charles [name]:[id number] DOB: [birth year] DOA: 02/22/2019 PCP: [name], MD  Brief History: 68 year old male with a history of stage IV rectal cancer with metastasis to lung and pancreas, DVT, PE, diabetes mellitus type 2, peripheral vascular disease presenting with 1 day history of right-sided abdominal pain with fever up to 100.0 F. Patient also noted some loose stools in his ileostomy which is unusual for him. He had denied any vomiting but had some nausea. He denied any headache, neck pain, coughing, hemoptysis, chest pain, shortness of breath, palpitations, dysuria. In the ED, the patient had temperature up to 100.3 F, but he was hemodynamically stable with oxygen saturation 97% on room air. WBC was up to 12.3 with lactic acid 2.7. CT of the abdomen and pelvis showed stable right upper lobe pulmonary nodules, stable pancreatic head mass, periappendiceal inflammation with the tip of the appendix dilated 2.1 cm and possible 7 mm fluid collection adjacent to the tip of the appendix. The patient was started on Zosyn. General surgery was consulted.  Assessment/Plan: Sepsis -Present at the time of admission -Secondary to appendicitis -Lactic acid peaked 2.7 -Continue IV fluids -Urinalysis negative for pyuria -Continue Zosyn -Follow blood cultures--neg  Acute appendicitis -CT abdomen as discussed above -General surgery consult appreciated-->nonoperative management -diet advanced--pt tolerated food [employer] today  Stage IV rectal cancer -Last chemotherapy 02/09/2019 -Patient follows Dr. [name]  History of DVT/PE -Holding rivaroxaban secondary to anticipation for surgery -Last dose of rivaroxaban evening 02/21/2019 -pt has IVC filter  Diabetes mellitus type 2 -NovoLog sliding scale -Holding glimepiride -10/22--Hemoglobin A1c--7.1  Depression/anxiety -Continue home dose Remeron and Klonopin  Chronic pain -last Percocet  Rx 10/01/18; pt receives monthly klonopin -PMP Aware queried       Disposition Plan: Home 10/24 if stable and cleared by surgery Family Communication:Spouse updatedat bedside 10/23  Consultants:General surgery  Code Status: FULL   DVT Prophylaxis:IV heparin   Procedures: As Listed in Progress Note Above  Antibiotics: Zosyn 10/21>>>      Subjective:   Objective: Vitals:   02/24/19 2004 02/24/19 2224 02/25/19 0513 02/25/19 1340  BP:  134/68 106/77 120/66  Pulse:  96 92   Resp:  16 18 17   Temp:  98.6 F (37 C) 98.4 F (36.9 C)   TempSrc:  Oral Oral   SpO2: 96% 99% 97%   Weight:      Height:        Intake/Output Summary (Last 24 hours) at 02/25/2019 1726 Last data filed at 02/25/2019 1300 Gross per 24 hour  Intake 1216 ml  Output 2875 ml  Net -1659 ml   Weight change:  Exam:   General:  Pt is alert, follows commands appropriately, not in acute distress  HEENT: No icterus, No thrush, No neck mass, [place]/AT  Cardiovascular: RRR, S1/S2, no rubs, no gallops  Respiratory: CTA bilaterally, no wheezing, no crackles, no rhonchi  Abdomen: Soft/+BS, non tender, non distended, no guarding  Extremities: No edema, No lymphangitis, No petechiae, No rashes, no synovitis   Data Reviewed: I have personally reviewed following labs and imaging studies Basic Metabolic Panel: Recent Labs  Lab 02/22/19 1749 02/23/19 0404 02/24/19 0110 02/25/19 0447  NA 132* 137 136 138  K 3.7 3.8 3.7 3.6  CL 101 106 107 107  CO2 22 23 22 23   GLUCOSE 130* 93 168* 134*  BUN 10 8 7* 6*  CREATININE 0.79 0.63 0.66 0.75  CALCIUM 8.0* 7.9*  7.7* 7.8*  MG  --   --  1.6* 2.1   Liver Function Tests: Recent Labs  Lab 02/22/19 1749 02/23/19 0404  AST 14* 13*  ALT 17 15  ALKPHOS 103 95  BILITOT 0.7 0.8  PROT 5.9* 5.2*  ALBUMIN 2.9* 2.5*   Recent Labs  Lab 02/22/19 1749  LIPASE 14   No results for input(s): AMMONIA in the last 168 hours.  Coagulation Profile: No results for input(s): INR, PROTIME in the last 168 hours. CBC: Recent Labs  Lab 02/22/19 1749 02/23/19 0404 02/24/19 0110 02/25/19 0447  WBC 10.5 12.3* 12.8* 12.5*  NEUTROABS 7.7  --   --   --   HGB 12.3* 11.2* 10.7* 10.6*  HCT 39.6 35.8* 34.3* 33.9*  MCV 93.0 93.2 91.5 92.4  PLT 254 231 250 290   Cardiac Enzymes: No results for input(s): CKTOTAL, CKMB, CKMBINDEX, TROPONINI in the last 168 hours. BNP: Invalid input(s): POCBNP CBG: Recent Labs  Lab 02/25/19 0019 02/25/19 0517 02/25/19 0715 02/25/19 1051 02/25/19 1607  GLUCAP 203* 140* 163* 157* 133*   HbA1C: Recent Labs    02/22/19 2003 02/24/19 0110  HGBA1C 7.1* 7.1*   Urine analysis:    Component Value Date/Time   COLORURINE AMBER (A) 02/22/2019 2013   APPEARANCEUR CLEAR 02/22/2019 2013   LABSPEC 1.026 02/22/2019 2013   PHURINE 6.0 02/22/2019 2013   [place] 02/22/2019 2013   HGBUR NEGATIVE 02/22/2019 2013   [place] NEGATIVE 02/22/2019 2013   [place] NEGATIVE 02/22/2019 2013   PROTEINUR 30 (A) 02/22/2019 2013   UROBILINOGEN 1.0 11/14/2014 1504   NITRITE NEGATIVE 02/22/2019 2013   LEUKOCYTESUR NEGATIVE 02/22/2019 2013   Sepsis Labs: @LABRCNTIP (procalcitonin:4,lacticidven:4) ) Recent Results (from the past 240 hour(s))  Blood culture (routine x 2)     Status: None (Preliminary result)   Collection Time: 02/22/19  6:51 PM   Specimen: BLOOD LEFT WRIST  Result Value Ref Range Status   Specimen Description BLOOD LEFT WRIST  Final   Special Requests   Final    BOTTLES DRAWN AEROBIC AND ANAEROBIC Blood Culture adequate volume   Culture   Final    NO GROWTH 3 DAYS Performed at [facility], [address], [place], [place] [address]    Report Status PENDING  Incomplete  Blood culture (routine x 2)     Status: None (Preliminary result)   Collection Time: 02/22/19  6:52 PM   Specimen: Right Antecubital; Blood  Result Value Ref Range Status   Specimen Description  RIGHT ANTECUBITAL  Final   Special Requests   Final    BOTTLES DRAWN AEROBIC AND ANAEROBIC Blood Culture adequate volume   Culture   Final    NO GROWTH 3 DAYS Performed at [facility], [address] [place], [place] [address]    Report Status PENDING  Incomplete  Urine culture     Status: Abnormal   Collection Time: 02/22/19  8:13 PM   Specimen: Urine, Clean Catch  Result Value Ref Range Status   Specimen Description   Final    URINE, CLEAN CATCH Performed at [facility], [address], [place], [place] [address]    Special Requests   Final    NONE Performed at [facility], [address], [place], [place] [address]    Culture MULTIPLE SPECIES PRESENT, SUGGEST RECOLLECTION (A)  Final   Report Status 02/24/2019 FINAL  Final  SARS Coronavirus 2 by RT PCR (hospital order, performed in [facility] hospital lab) Nasopharyngeal Nasopharyngeal Swab  Status: None   Collection Time: 02/22/19 10:22 PM   Specimen: Nasopharyngeal Swab  Result Value Ref Range Status   SARS Coronavirus 2 NEGATIVE NEGATIVE Final    Comment: (NOTE) If result is NEGATIVE SARS-CoV-2 target nucleic acids are NOT DETECTED. The SARS-CoV-2 RNA is generally detectable in upper and lower  respiratory specimens during the acute phase of infection. The lowest  concentration of SARS-CoV-2 viral copies this assay can detect is 250  copies / mL. A negative result does not preclude SARS-CoV-2 infection  and should not be used as the sole basis for treatment or other  patient management decisions.  A negative result may occur with  improper specimen collection / handling, submission of specimen other  than nasopharyngeal swab, presence of viral mutation(s) within the  areas targeted by this assay, and inadequate number of viral copies  (<250 copies / mL). A negative result must be combined with clinical  observations, patient history, and epidemiological information. If result is POSITIVE SARS-CoV-2 target nucleic  acids are DETECTED. The SARS-CoV-2 RNA is generally detectable in upper and lower  respiratory specimens dur ing the acute phase of infection.  Positive  results are indicative of active infection with SARS-CoV-2.  Clinical  correlation with patient history and other diagnostic information is  necessary to determine patient infection status.  Positive results do  not rule out bacterial infection or co-infection with other viruses. If result is PRESUMPTIVE POSTIVE SARS-CoV-2 nucleic acids MAY BE PRESENT.   A presumptive positive result was obtained on the submitted specimen  and confirmed on repeat testing.  While 2019 novel coronavirus  (SARS-CoV-2) nucleic acids may be present in the submitted sample  additional confirmatory testing may be necessary for epidemiological  and / or clinical management purposes  to differentiate between  SARS-CoV-2 and other Sarbecovirus currently known to infect humans.  If clinically indicated additional testing with an alternate test  methodology [id number]) is advised. The SARS-CoV-2 RNA is generally  detectable in upper and lower respiratory sp ecimens during the acute  phase of infection. The expected result is Negative. Fact Sheet for Patients:  StrictlyIdeas.no Fact Sheet for Healthcare Providers: BankingDealers.co.za This test is not yet approved or cleared by the [place] FDA and has been authorized for detection and/or diagnosis of SARS-CoV-2 by FDA under an Emergency Use Authorization (EUA).  This EUA will remain in effect (meaning this test can be used) for the duration of the COVID-19 declaration under Section 564(b)(1) of the Act, 21 U.S.C. section 360bbb-3(b)(1), unless the authorization is terminated or revoked sooner. Performed at [facility], [address] [place], [place] [address]   C difficile quick scan w PCR reflex     Status: None   Collection Time: 02/23/19 10:45 PM    Specimen: STOOL  Result Value Ref Range Status   C Diff antigen NEGATIVE NEGATIVE Final   C Diff toxin NEGATIVE NEGATIVE Final   C Diff interpretation No C. difficile detected.  Final    Comment: Performed at [facility], [address] [place], [place] [address]     Scheduled Meds: . Chlorhexidine Gluconate Cloth  6 each Topical Daily  . clonazePAM  0.5 mg Oral BID  . feeding supplement (GLUCERNA SHAKE)  237 mL Oral TID BM  . gabapentin  900 mg Oral BID  . insulin aspart  0-9 Units Subcutaneous TID WC  . mirtazapine  7.5 mg Oral QHS   Continuous Infusions: . sodium chloride 100 mL/hr at 02/24/19 0957  .  heparin 1,250 Units/hr (02/25/19 1133)  . piperacillin-tazobactam (ZOSYN)  IV 3.375 g (02/25/19 1130)    Procedures/Studies: Ct Chest W Contrast  Result Date: 02/22/2019 CLINICAL DATA:  Nausea vomiting diarrhea history of rectal cancer with metastatic disease EXAM: CT CHEST, ABDOMEN, AND PELVIS WITH CONTRAST TECHNIQUE: Multidetector CT imaging of the chest, abdomen and pelvis was performed following the standard protocol during bolus administration of intravenous contrast. CONTRAST:  123mL OMNIPAQUE IOHEXOL 300 MG/ML  SOLN COMPARISON:  CT 09/22/2018, 07/30/2018, 05/28/2018 FINDINGS: CT CHEST FINDINGS Cardiovascular: Nonaneurysmal aorta. Mild aortic atherosclerosis. Coronary vascular calcification. Normal heart size. No pericardial effusion. Right-sided central venous port with tip at the distal SVC. Mediastinum/Nodes: Midline trachea. No thyroid mass. No increasing mediastinal adenopathy. Esophagus within normal limits. Lungs/Pleura: No acute consolidation or pleural effusion. Stable 6 mm anterior right upper lobe pulmonary nodule, series 3, image number 106, previously 6 mm. Posterior right upper lobe pulmonary nodule, series 3, image number 41 measures 11 mm, previously 11 mm. Posterior right upper lobe 11 mm pulmonary nodule, series 3, image number 75, previously 13 mm. No new pulmonary  nodule. Right middle lobe postsurgical changes Musculoskeletal: Stable mixed sclerotic and lucent lesion in the lower sternum. Degenerative changes of the spine. CT ABDOMEN PELVIS FINDINGS Hepatobiliary: Contracted gallbladder without calcified stone. No focal hepatic abnormality. No ductal dilatation Pancreas: Diffusely atrophic. Diffuse pancreatic duct dilatation with truncation at the pancreatic head. Stable 2.1 cm mass at the head of the pancreas. Spleen: Normal in size without focal abnormality. Adrenals/Urinary Tract: Left adrenal calcification. No adrenal mass. Kidneys show no hydronephrosis. Stable subcentimeter exophytic slightly dense lesion off the lower pole left kidney. The bladder is slightly thick walled. Stomach/Bowel: The stomach is nonenlarged. No dilated small bowel. Status post partial proctocolectomy decreased rectal wall thickening compared to most recent prior. Interval left lower quadrant loop colostomy. Abnormal appendix. Small appendicoliths. Considerable periappendiceal inflammatory change. Appendix tip dilated up to 2.1 cm. No extraluminal gas. Possible tiny 7 mm fluid collection adjacent to tip of appendix. Vascular/Lymphatic: IVC filter with tip just below the renal vein confluence. Similar protrusion struts beyond the IVC lumen. Nonaneurysmal aorta with mild aortic atherosclerosis. Subcentimeter lymph nodes in the right lower quadrant could be reactive. Reproductive: Prostate calcification Other: Mild presacral soft tissue stranding. No free fluid. No significant pelvic effusion Musculoskeletal: No acute or suspicious osseous abnormality IMPRESSION: 1. Findings consistent with acute appendicitis. No definitive extraluminal gas though considerable inflammatory change about the appendix and possible tiny 7 mm tiny fluid collection adjacent to the tip of the appendix. Appendix: Location: Right lower quadrant Diameter: Up to 21 mm Appendicolith: Positive for stone near the tip Mucosal  hyper-enhancement: Negative Extraluminal gas: Negative Periappendiceal collection: Possible tiny 7 mm fluid collection adjacent to the tip of the appendix. 2. Status post partial proctocolectomy with interval left lower quadrant loop colostomy. Decreased rectal wall thickening. 3.  Stable right upper lobe pulmonary nodules. 4. Stable 2 cm mass at the head of pancreas with atrophy ductal obstruction distal to this 5. Stable mixed lucent and sclerotic lesion involving the lower sternum Electronically Signed   By: Donavan Foil M.D.   On: 02/22/2019 21:40   Ct Abdomen Pelvis W Contrast  Result Date: 02/22/2019 CLINICAL DATA:  Nausea vomiting diarrhea history of rectal cancer with metastatic disease EXAM: CT CHEST, ABDOMEN, AND PELVIS WITH CONTRAST TECHNIQUE: Multidetector CT imaging of the chest, abdomen and pelvis was performed following the standard protocol during bolus administration of intravenous contrast. CONTRAST:  132mL OMNIPAQUE IOHEXOL  300 MG/ML  SOLN COMPARISON:  CT 09/22/2018, 07/30/2018, 05/28/2018 FINDINGS: CT CHEST FINDINGS Cardiovascular: Nonaneurysmal aorta. Mild aortic atherosclerosis. Coronary vascular calcification. Normal heart size. No pericardial effusion. Right-sided central venous port with tip at the distal SVC. Mediastinum/Nodes: Midline trachea. No thyroid mass. No increasing mediastinal adenopathy. Esophagus within normal limits. Lungs/Pleura: No acute consolidation or pleural effusion. Stable 6 mm anterior right upper lobe pulmonary nodule, series 3, image number 106, previously 6 mm. Posterior right upper lobe pulmonary nodule, series 3, image number 41 measures 11 mm, previously 11 mm. Posterior right upper lobe 11 mm pulmonary nodule, series 3, image number 75, previously 13 mm. No new pulmonary nodule. Right middle lobe postsurgical changes Musculoskeletal: Stable mixed sclerotic and lucent lesion in the lower sternum. Degenerative changes of the spine. CT ABDOMEN PELVIS FINDINGS  Hepatobiliary: Contracted gallbladder without calcified stone. No focal hepatic abnormality. No ductal dilatation Pancreas: Diffusely atrophic. Diffuse pancreatic duct dilatation with truncation at the pancreatic head. Stable 2.1 cm mass at the head of the pancreas. Spleen: Normal in size without focal abnormality. Adrenals/Urinary Tract: Left adrenal calcification. No adrenal mass. Kidneys show no hydronephrosis. Stable subcentimeter exophytic slightly dense lesion off the lower pole left kidney. The bladder is slightly thick walled. Stomach/Bowel: The stomach is nonenlarged. No dilated small bowel. Status post partial proctocolectomy decreased rectal wall thickening compared to most recent prior. Interval left lower quadrant loop colostomy. Abnormal appendix. Small appendicoliths. Considerable periappendiceal inflammatory change. Appendix tip dilated up to 2.1 cm. No extraluminal gas. Possible tiny 7 mm fluid collection adjacent to tip of appendix. Vascular/Lymphatic: IVC filter with tip just below the renal vein confluence. Similar protrusion struts beyond the IVC lumen. Nonaneurysmal aorta with mild aortic atherosclerosis. Subcentimeter lymph nodes in the right lower quadrant could be reactive. Reproductive: Prostate calcification Other: Mild presacral soft tissue stranding. No free fluid. No significant pelvic effusion Musculoskeletal: No acute or suspicious osseous abnormality IMPRESSION: 1. Findings consistent with acute appendicitis. No definitive extraluminal gas though considerable inflammatory change about the appendix and possible tiny 7 mm tiny fluid collection adjacent to the tip of the appendix. Appendix: Location: Right lower quadrant Diameter: Up to 21 mm Appendicolith: Positive for stone near the tip Mucosal hyper-enhancement: Negative Extraluminal gas: Negative Periappendiceal collection: Possible tiny 7 mm fluid collection adjacent to the tip of the appendix. 2. Status post partial  proctocolectomy with interval left lower quadrant loop colostomy. Decreased rectal wall thickening. 3.  Stable right upper lobe pulmonary nodules. 4. Stable 2 cm mass at the head of pancreas with atrophy ductal obstruction distal to this 5. Stable mixed lucent and sclerotic lesion involving the lower sternum Electronically Signed   By: Donavan Foil M.D.   On: 02/22/2019 21:40    Orson Eva, DO  [facility] Pager [phone]  If 7PM-7AM, please contact night-coverage [email or web address] Password [email or web address] 02/25/2019, 5:26 PM   LOS: 2 days

## 2019-02-25 NOTE — Progress Notes (Signed)
[place] for heparin Indication: pulmonary embolus  Allergies  Allergen Reactions  . Oxycodone     Blisters, hallucinations  . Tramadol     Blisters, hallucinations  . Trazodone And Nefazodone Other (See Comments)    hallucinations    Patient Measurements: Height: 6\' 2"  (188 cm) Weight: 184 lb 8.4 oz (83.7 kg) IBW/kg (Calculated) : 82.2  HEPARIN DW (KG): 83.7   Vital Signs: Temp: 98.6 F (37 C) (10/22 2224) Temp Source: Oral (10/22 2224) BP: 134/68 (10/22 2224) Pulse Rate: 96 (10/22 2224)  Labs: Recent Labs    02/22/19 1749 02/23/19 0404  02/23/19 1552 02/24/19 0110 02/24/19 0921 02/24/19 1735 02/25/19 0102  HGB 12.3* 11.2*  --   --  10.7*  --   --   --   HCT 39.6 35.8*  --   --  34.3*  --   --   --   PLT 254 231  --   --  250  --   --   --   APTT  --   --   --  51* >200*  --   --   --   HEPARINUNFRC  --   --    < > <0.10* 0.55 0.83* 0.19* 0.54  CREATININE 0.79 0.63  --   --  0.66  --   --   --    < > = values in this interval not displayed.    Estimated Creatinine Clearance: 102.8 mL/min (by C-G formula based on SCr of 0.66 mg/dL).   Assessment:  Pharmacy consulted to dose heparin infusion for this 68 yo male for acute PE. He was taking rivaroxaban 20mg  daily at home PTA, with his last dose on 02-21-19.  Baseline CBC is WNL. Noted that baseline heparin level was undetectable so will only need to utilize heparin levels as Xarelto no longer affecting levels.  1:57 AM Heparin level therapeutic (0.54) on gtt at 1250 units/hr. No bleeding noted.   Goal of Therapy:  Heparin level 0.3-0.7 units/ml Monitor platelets by anticoagulation protocol: Yes   Plan:  Continuee heparin infusion at 1250 units/hr Will check anti-Xa level daily Continue to monitor H&H and platelets   Sherlon Handing, PharmD, BCPS 02/25/2019 1:57 AM

## 2019-02-25 NOTE — TOC Transition Note (Signed)
Transition of Care [facility]) - CM/SW Discharge Note   Patient Details  Name: Charles [name] MRN: [id number] Date of Birth: 16-Apr-1951  [facility] of [facility] CM/SW Contact:  Shade Flood, LCSW Phone Number: 02/25/2019, 11:01 AM   Clinical Narrative:     Pt may be stable for dc today per MD. Plan is for return home at dc. Pt declined [place] services. PCP follow up appointment scheduled and added to pt's AVS.  There are no other TOC needs for dc.  Final next level of care: Home/Self Care Barriers to Discharge: Barriers Resolved   Patient Goals and CMS Choice Patient states their goals for this hospitalization and ongoing recovery are:: feel better and return home      Discharge Placement                       Discharge Plan and Services   Discharge Planning Services: CM Consult                                 Social Determinants of Health (SDOH) Interventions     Readmission Risk Interventions Readmission Risk Prevention Plan 02/25/2019 02/24/2019 09/24/2018  Transportation Screening Complete Complete Complete  HRI or Home Care Consult - Not Complete Complete  HRI or Home Care Consult comments - declined -  Social Work Consult for [place] Planning/Counseling - Complete Complete  Palliative Care Screening - Not Applicable Not Applicable  Medication Review [employer] photographer) - Complete Complete  Some recent data might be hidden

## 2019-02-25 NOTE — Progress Notes (Signed)
[facility] Progress Note     Subjective: No major issues. Pain controlled. Eating diet. Having Bms.   Objective: Vital signs in last 24 hours: Temp:  [98.4 F (36.9 C)-98.6 F (37 C)] 98.4 F (36.9 C) (10/23 0513) Pulse Rate:  [92-96] 92 (10/23 0513) Resp:  [16-18] 17 (10/23 1340) BP: (106-134)/(66-77) 120/66 (10/23 1340) SpO2:  [96 %-99 %] 97 % (10/23 0513) Last BM Date: 02/23/19  Intake/Output from previous day: 10/22 0701 - 10/23 0700 In: 976 [P.O.:720; I.V.:106; IV Piggyback:150] Out: 1725 [Urine:900; Stool:825] Intake/Output this shift: Total I/O In: 480 [P.O.:480] Out: 1150 [Urine:500; Stool:650]  General appearance: alert, cooperative and no distress Resp: normal work of breathing GI: soft, nondistended, nontender RLQ, ostomy LUQ with stool in bag  Lab Results:  Recent Labs    02/24/19 0110 02/25/19 0447  WBC 12.8* 12.5*  HGB 10.7* 10.6*  HCT 34.3* 33.9*  PLT 250 290   BMET Recent Labs    02/24/19 0110 02/25/19 0447  NA 136 138  K 3.7 3.6  CL 107 107  CO2 22 23  GLUCOSE 168* 134*  BUN 7* 6*  CREATININE 0.66 0.75  CALCIUM 7.7* 7.8*    Anti-infectives: Anti-infectives (From admission, onward)   Start     Dose/Rate Route Frequency Ordered Stop   02/23/19 1200  piperacillin-tazobactam (ZOSYN) IVPB 3.375 g     3.375 g 12.5 mL/hr over 240 Minutes Intravenous Every 8 hours 02/23/19 0432     02/23/19 1000  vancomycin (VANCOCIN) 1,250 mg in sodium chloride 0.9 % 250 mL IVPB  Status:  Discontinued     1,250 mg 166.7 mL/hr over 90 Minutes Intravenous Every 12 hours 02/22/19 1933 02/23/19 0725   02/23/19 1000  Vancomycin (VANCOCIN) 1,250 mg in sodium chloride 0.9 % 250 mL IVPB  Status:  Discontinued     1,250 mg 166.7 mL/hr over 90 Minutes Intravenous Every 12 hours 02/23/19 0725 02/23/19 1625   02/23/19 0600  piperacillin-tazobactam (ZOSYN) IVPB 2.25 g  Status:  Discontinued     2.25 g 100 mL/hr over 30 Minutes Intravenous  Once  02/22/19 2315 02/22/19 2340   02/23/19 0600  piperacillin-tazobactam (ZOSYN) IVPB 3.375 g     3.375 g 100 mL/hr over 30 Minutes Intravenous 30 min pre-op 02/22/19 2341 02/23/19 0926   02/23/19 0400  ceFEPIme (MAXIPIME) 2 g in sodium chloride 0.9 % 100 mL IVPB  Status:  Discontinued     2 g 200 mL/hr over 30 Minutes Intravenous Every 8 hours 02/22/19 1933 02/23/19 0340   02/22/19 1815  vancomycin (VANCOCIN) 1,500 mg in sodium chloride 0.9 % 500 mL IVPB     1,500 mg 250 mL/hr over 120 Minutes Intravenous  Once 02/22/19 1810 02/22/19 2238   02/22/19 1800  ceFEPIme (MAXIPIME) 2 g in sodium chloride 0.9 % 100 mL IVPB     2 g 200 mL/hr over 30 Minutes Intravenous  Once 02/22/19 1753 02/22/19 1946   02/22/19 1800  metroNIDAZOLE (FLAGYL) IVPB 500 mg     500 mg 100 mL/hr over 60 Minutes Intravenous  Once 02/22/19 1753 02/22/19 2013   02/22/19 1800  vancomycin (VANCOCIN) IVPB 1000 mg/200 mL premix  Status:  Discontinued     1,000 mg 200 mL/hr over 60 Minutes Intravenous  Once 02/22/19 1753 02/22/19 1810      Assessment/Plan: Mr. Kleinert is a 68 yo with acute appendicitis with possible rupture in the setting of chemotherapy for metastatic rectal cancer. Doing well on IV antibiotics. Feeling better. -  Will get CBC with dif tomorrow -Hopefully home tomorrow and will get full 14 day course continued with Augmentin for appendicitis  -Keep heparin gtt for now until dc    LOS: 2 days    Virl Cagey 02/25/2019

## 2019-02-25 NOTE — Progress Notes (Signed)
[place] for heparin Indication: pulmonary embolus  Allergies  Allergen Reactions  . Oxycodone     Blisters, hallucinations  . Tramadol     Blisters, hallucinations  . Trazodone And Nefazodone Other (See Comments)    hallucinations    Patient Measurements: Height: 6\' 2"  (188 cm) Weight: 184 lb 8.4 oz (83.7 kg) IBW/kg (Calculated) : 82.2  HEPARIN DW (KG): 83.7   Vital Signs: Temp: 98.4 F (36.9 C) (10/23 0513) Temp Source: Oral (10/23 0513) BP: 106/77 (10/23 0513) Pulse Rate: 92 (10/23 0513)  Labs: Recent Labs    02/23/19 0404  02/23/19 1552 02/24/19 0110  02/24/19 1735 02/25/19 0102 02/25/19 0447 02/25/19 0449  HGB 11.2*  --   --  10.7*  --   --   --  10.6*  --   HCT 35.8*  --   --  34.3*  --   --   --  33.9*  --   PLT 231  --   --  250  --   --   --  290  --   APTT  --   --  51* >200*  --   --   --   --   --   HEPARINUNFRC  --    < > <0.10* 0.55   < > 0.19* 0.54  --  0.62  CREATININE 0.63  --   --  0.66  --   --   --  0.75  --    < > = values in this interval not displayed.    Estimated Creatinine Clearance: 102.8 mL/min (by C-G formula based on SCr of 0.75 mg/dL).   Assessment:  Pharmacy consulted to dose heparin infusion for this 68 yo male for acute PE. He was taking rivaroxaban 20mg  daily at home PTA, with his last dose on 02-21-19.  Baseline CBC is WNL. Noted that baseline heparin level was undetectable so will only need to utilize heparin levels as Xarelto no longer affecting levels.  HL therapeutic at 0.62   Goal of Therapy:  Heparin level 0.3-0.7 units/ml Monitor platelets by anticoagulation protocol: Yes   Plan:  Continuee heparin infusion at 1250 units/hr Will check anti-Xa level daily Continue to monitor H&H and platelets   Margot Ables, PharmD Clinical Pharmacist 02/25/2019 7:41 AM

## 2019-02-26 DIAGNOSIS — A419 Sepsis, unspecified organism: Secondary | ICD-10-CM | POA: Diagnosis not present

## 2019-02-26 DIAGNOSIS — R6510 Systemic inflammatory response syndrome (SIRS) of non-infectious origin without acute organ dysfunction: Secondary | ICD-10-CM

## 2019-02-26 LAB — CBC WITH DIFFERENTIAL/PLATELET
Abs Immature Granulocytes: 0.15 10*3/uL — ABNORMAL HIGH (ref 0.00–0.07)
Basophils Absolute: 0 10*3/uL (ref 0.0–0.1)
Basophils Relative: 0 %
Eosinophils Absolute: 0.2 10*3/uL (ref 0.0–0.5)
Eosinophils Relative: 1 %
HCT: 33.3 % — ABNORMAL LOW (ref 39.0–52.0)
Hemoglobin: 10.4 g/dL — ABNORMAL LOW (ref 13.0–17.0)
Immature Granulocytes: 1 %
Lymphocytes Relative: 17 %
Lymphs Abs: 2 10*3/uL (ref 0.7–4.0)
MCH: 29 pg (ref 26.0–34.0)
MCHC: 31.2 g/dL (ref 30.0–36.0)
MCV: 92.8 fL (ref 80.0–100.0)
Monocytes Absolute: 1.2 10*3/uL — ABNORMAL HIGH (ref 0.1–1.0)
Monocytes Relative: 10 %
Neutro Abs: 8.8 10*3/uL — ABNORMAL HIGH (ref 1.7–7.7)
Neutrophils Relative %: 71 %
Platelets: 319 10*3/uL (ref 150–400)
RBC: 3.59 MIL/uL — ABNORMAL LOW (ref 4.22–5.81)
RDW: 14.7 % (ref 11.5–15.5)
WBC: 12.3 10*3/uL — ABNORMAL HIGH (ref 4.0–10.5)
nRBC: 0 % (ref 0.0–0.2)

## 2019-02-26 LAB — GLUCOSE, CAPILLARY
Glucose-Capillary: 117 mg/dL — ABNORMAL HIGH (ref 70–99)
Glucose-Capillary: 123 mg/dL — ABNORMAL HIGH (ref 70–99)
Glucose-Capillary: 124 mg/dL — ABNORMAL HIGH (ref 70–99)
Glucose-Capillary: 130 mg/dL — ABNORMAL HIGH (ref 70–99)

## 2019-02-26 LAB — HEPARIN LEVEL (UNFRACTIONATED): Heparin Unfractionated: 0.6 IU/mL (ref 0.30–0.70)

## 2019-02-26 MED ORDER — AMOXICILLIN-POT CLAVULANATE 875-125 MG PO TABS: 1.0000 | ORAL_TABLET | Freq: Two times a day (BID) | ORAL | 0 refills | Status: DC

## 2019-02-26 MED ORDER — AMOXICILLIN-POT CLAVULANATE 875-125 MG PO TABS
1.0000 | ORAL_TABLET | Freq: Two times a day (BID) | ORAL | Status: DC
  Administered 2019-02-26: 1 via ORAL
  Filled 2019-02-26: qty 1

## 2019-02-26 NOTE — Discharge Summary (Signed)
Physician Discharge Summary  Charles [name] OQH:[id number] DOB: Nov 12, 1950 DOA: 02/22/2019  PCP: [name], MD  Admit date: 02/22/2019 Discharge date: 02/26/2019  Admitted From: Home Disposition:  Home  Recommendations for Outpatient Follow-up:  1. Follow up with PCP in 1-2 weeks 2. Please obtain BMP/CBC in one week     Discharge Condition: Stable CODE STATUS: FULL Diet recommendation:Soft   Brief/Interim Summary: 68 year old male with a history of stage IV rectal cancer with metastasis to lung and pancreas, DVT, PE, diabetes mellitus type 2, peripheral vascular disease presenting with 1 day history of right-sided abdominal pain with fever up to 100.0 F. Patient also noted some loose stools in his ileostomy which is unusual for him. He had denied any vomiting but had some nausea. He denied any headache, neck pain, coughing, hemoptysis, chest pain, shortness of breath, palpitations, dysuria. In the ED, the patient had temperature up to 100.3 F, but he was hemodynamically stable with oxygen saturation 97% on room air. WBC was up to 12.3 with lactic acid 2.7. CT of the abdomen and pelvis showed stable right upper lobe pulmonary nodules, stable pancreatic head mass, periappendiceal inflammation with the tip of the appendix dilated 2.1 cm and possible 7 mm fluid collection adjacent to the tip of the appendix. The patient was started on Zosyn. General surgery was consulted. The patient was managed nonoperatively.  He improved with conservative care and bowel rest and IV abx.  His diet was gradually advanced which he tolerated.  His WBC remained stable and his abd pain improved.  He will d/c home with amox/clav x 10 more days.  He will follow up with general surgery in the office on 03/08/19  Discharge Diagnoses:  Sepsis -Present at the time of admission -Secondary to appendicitis -Lactic acid peaked 2.7 -Continue IV fluids -Urinalysis negative for pyuria -Continue  Zosyn>>>d/c home with amox/clav x 10 more days -Follow blood cultures--neg  Acute appendicitis -CT abdomen as discussed above -General surgery consultappreciated-->nonoperative management -diet advanced--pt tolerated food [employer] 10/24 and 10/25  Stage IV rectal cancer -Last chemotherapy 02/09/2019 -Patient follows Dr. [name]  History of DVT/PE -Holding rivaroxaban secondary to anticipation for surgery -Last dose of rivaroxaban evening 02/21/2019 -pt has IVC filter  Diabetes mellitus type 2 -NovoLog sliding scale -Holding glimepiride -10/22--Hemoglobin A1c--7.1  Depression/anxiety -Continue home dose Remeron and Klonopin  Chronic pain -last Percocet Rx 10/01/18; pt receives monthly klonopin -PMP Aware queried    Discharge Instructions   Allergies as of 02/26/2019      Reactions   Oxycodone    Blisters, hallucinations   Tramadol    Blisters, hallucinations   Trazodone And Nefazodone Other (See Comments)   hallucinations      Medication List    STOP taking these medications   diphenoxylate-atropine 2.5-0.025 MG tablet Commonly known as: LOMOTIL     TAKE these medications   acetaminophen 500 MG tablet Commonly known as: TYLENOL Take 1,000 mg by mouth every 6 (six) hours as needed for mild pain or moderate pain.   amoxicillin-clavulanate 875-125 MG tablet Commonly known as: AUGMENTIN Take 1 tablet by mouth every 12 (twelve) hours.   clonazePAM 1 MG tablet Commonly known as: KLONOPIN Take 1 tablet by mouth three times daily as needed What changed: when to take this   dronabinol 5 MG capsule Commonly known as: MARINOL Take 1 capsule (5 mg total) by mouth 2 (two) times daily before a meal.   [employer] Mouthwash Susp Use as directed 5 mLs in the mouth  or throat 4 (four) times daily.   gabapentin 300 MG capsule Commonly known as: NEURONTIN TAKE 3 CAPSULES BY MOUTH TWICE DAILY   glimepiride 2 MG tablet Commonly known as: AMARYL Take 2  mg by mouth 2 (two) times daily before a meal.   mirtazapine 7.5 MG tablet Commonly known as: REMERON Take 7.5 mg by mouth at bedtime.   ondansetron 8 MG tablet Commonly known as: Zofran Take 1 tablet (8 mg total) by mouth 2 (two) times daily as needed for refractory nausea / vomiting. Start on day 3 after chemotherapy.   oxyCODONE-acetaminophen 10-325 MG tablet Commonly known as: Percocet Take 1 tablet by mouth every 6 (six) hours as needed for pain.   pantoprazole 40 MG tablet Commonly known as: PROTONIX Take 40 mg by mouth daily.   PROBIOTIC DAILY PO Take 1 capsule by mouth daily.   rivaroxaban 20 MG Tabs tablet Commonly known as: XARELTO Take 1 tablet (20 mg total) by mouth daily with supper.   vitamin B-12 1000 MCG tablet Commonly known as: CYANOCOBALAMIN Take 1,000 mcg by mouth daily.      Follow-up Information    [name], MD. Go on 03/02/2019.   Specialty: Internal Medicine Why: Please arrive by 2:45pm for a hospital follow up appointment. Contact information: [address] [place] [address] [phone]          Allergies  Allergen Reactions   Oxycodone     Blisters, hallucinations   Tramadol     Blisters, hallucinations   Trazodone And Nefazodone Other (See Comments)    hallucinations    Consultations:  General surgery   Procedures/Studies: Ct Chest W Contrast  Result Date: 02/22/2019 CLINICAL DATA:  Nausea vomiting diarrhea history of rectal cancer with metastatic disease EXAM: CT CHEST, ABDOMEN, AND PELVIS WITH CONTRAST TECHNIQUE: Multidetector CT imaging of the chest, abdomen and pelvis was performed following the standard protocol during bolus administration of intravenous contrast. CONTRAST:  158mL OMNIPAQUE IOHEXOL 300 MG/ML  SOLN COMPARISON:  CT 09/22/2018, 07/30/2018, 05/28/2018 FINDINGS: CT CHEST FINDINGS Cardiovascular: Nonaneurysmal aorta. Mild aortic atherosclerosis. Coronary vascular calcification. Normal heart size. No  pericardial effusion. Right-sided central venous port with tip at the distal SVC. Mediastinum/Nodes: Midline trachea. No thyroid mass. No increasing mediastinal adenopathy. Esophagus within normal limits. Lungs/Pleura: No acute consolidation or pleural effusion. Stable 6 mm anterior right upper lobe pulmonary nodule, series 3, image number 106, previously 6 mm. Posterior right upper lobe pulmonary nodule, series 3, image number 41 measures 11 mm, previously 11 mm. Posterior right upper lobe 11 mm pulmonary nodule, series 3, image number 75, previously 13 mm. No new pulmonary nodule. Right middle lobe postsurgical changes Musculoskeletal: Stable mixed sclerotic and lucent lesion in the lower sternum. Degenerative changes of the spine. CT ABDOMEN PELVIS FINDINGS Hepatobiliary: Contracted gallbladder without calcified stone. No focal hepatic abnormality. No ductal dilatation Pancreas: Diffusely atrophic. Diffuse pancreatic duct dilatation with truncation at the pancreatic head. Stable 2.1 cm mass at the head of the pancreas. Spleen: Normal in size without focal abnormality. Adrenals/Urinary Tract: Left adrenal calcification. No adrenal mass. Kidneys show no hydronephrosis. Stable subcentimeter exophytic slightly dense lesion off the lower pole left kidney. The bladder is slightly thick walled. Stomach/Bowel: The stomach is nonenlarged. No dilated small bowel. Status post partial proctocolectomy decreased rectal wall thickening compared to most recent prior. Interval left lower quadrant loop colostomy. Abnormal appendix. Small appendicoliths. Considerable periappendiceal inflammatory change. Appendix tip dilated up to 2.1 cm. No extraluminal gas. Possible tiny 7  mm fluid collection adjacent to tip of appendix. Vascular/Lymphatic: IVC filter with tip just below the renal vein confluence. Similar protrusion struts beyond the IVC lumen. Nonaneurysmal aorta with mild aortic atherosclerosis. Subcentimeter lymph nodes in the  right lower quadrant could be reactive. Reproductive: Prostate calcification Other: Mild presacral soft tissue stranding. No free fluid. No significant pelvic effusion Musculoskeletal: No acute or suspicious osseous abnormality IMPRESSION: 1. Findings consistent with acute appendicitis. No definitive extraluminal gas though considerable inflammatory change about the appendix and possible tiny 7 mm tiny fluid collection adjacent to the tip of the appendix. Appendix: Location: Right lower quadrant Diameter: Up to 21 mm Appendicolith: Positive for stone near the tip Mucosal hyper-enhancement: Negative Extraluminal gas: Negative Periappendiceal collection: Possible tiny 7 mm fluid collection adjacent to the tip of the appendix. 2. Status post partial proctocolectomy with interval left lower quadrant loop colostomy. Decreased rectal wall thickening. 3.  Stable right upper lobe pulmonary nodules. 4. Stable 2 cm mass at the head of pancreas with atrophy ductal obstruction distal to this 5. Stable mixed lucent and sclerotic lesion involving the lower sternum Electronically Signed   By: Donavan Foil M.D.   On: 02/22/2019 21:40   Ct Abdomen Pelvis W Contrast  Result Date: 02/22/2019 CLINICAL DATA:  Nausea vomiting diarrhea history of rectal cancer with metastatic disease EXAM: CT CHEST, ABDOMEN, AND PELVIS WITH CONTRAST TECHNIQUE: Multidetector CT imaging of the chest, abdomen and pelvis was performed following the standard protocol during bolus administration of intravenous contrast. CONTRAST:  127mL OMNIPAQUE IOHEXOL 300 MG/ML  SOLN COMPARISON:  CT 09/22/2018, 07/30/2018, 05/28/2018 FINDINGS: CT CHEST FINDINGS Cardiovascular: Nonaneurysmal aorta. Mild aortic atherosclerosis. Coronary vascular calcification. Normal heart size. No pericardial effusion. Right-sided central venous port with tip at the distal SVC. Mediastinum/Nodes: Midline trachea. No thyroid mass. No increasing mediastinal adenopathy. Esophagus within  normal limits. Lungs/Pleura: No acute consolidation or pleural effusion. Stable 6 mm anterior right upper lobe pulmonary nodule, series 3, image number 106, previously 6 mm. Posterior right upper lobe pulmonary nodule, series 3, image number 41 measures 11 mm, previously 11 mm. Posterior right upper lobe 11 mm pulmonary nodule, series 3, image number 75, previously 13 mm. No new pulmonary nodule. Right middle lobe postsurgical changes Musculoskeletal: Stable mixed sclerotic and lucent lesion in the lower sternum. Degenerative changes of the spine. CT ABDOMEN PELVIS FINDINGS Hepatobiliary: Contracted gallbladder without calcified stone. No focal hepatic abnormality. No ductal dilatation Pancreas: Diffusely atrophic. Diffuse pancreatic duct dilatation with truncation at the pancreatic head. Stable 2.1 cm mass at the head of the pancreas. Spleen: Normal in size without focal abnormality. Adrenals/Urinary Tract: Left adrenal calcification. No adrenal mass. Kidneys show no hydronephrosis. Stable subcentimeter exophytic slightly dense lesion off the lower pole left kidney. The bladder is slightly thick walled. Stomach/Bowel: The stomach is nonenlarged. No dilated small bowel. Status post partial proctocolectomy decreased rectal wall thickening compared to most recent prior. Interval left lower quadrant loop colostomy. Abnormal appendix. Small appendicoliths. Considerable periappendiceal inflammatory change. Appendix tip dilated up to 2.1 cm. No extraluminal gas. Possible tiny 7 mm fluid collection adjacent to tip of appendix. Vascular/Lymphatic: IVC filter with tip just below the renal vein confluence. Similar protrusion struts beyond the IVC lumen. Nonaneurysmal aorta with mild aortic atherosclerosis. Subcentimeter lymph nodes in the right lower quadrant could be reactive. Reproductive: Prostate calcification Other: Mild presacral soft tissue stranding. No free fluid. No significant pelvic effusion Musculoskeletal: No  acute or suspicious osseous abnormality IMPRESSION: 1. Findings consistent with acute appendicitis. No  definitive extraluminal gas though considerable inflammatory change about the appendix and possible tiny 7 mm tiny fluid collection adjacent to the tip of the appendix. Appendix: Location: Right lower quadrant Diameter: Up to 21 mm Appendicolith: Positive for stone near the tip Mucosal hyper-enhancement: Negative Extraluminal gas: Negative Periappendiceal collection: Possible tiny 7 mm fluid collection adjacent to the tip of the appendix. 2. Status post partial proctocolectomy with interval left lower quadrant loop colostomy. Decreased rectal wall thickening. 3.  Stable right upper lobe pulmonary nodules. 4. Stable 2 cm mass at the head of pancreas with atrophy ductal obstruction distal to this 5. Stable mixed lucent and sclerotic lesion involving the lower sternum Electronically Signed   By: Donavan Foil M.D.   On: 02/22/2019 21:40         Discharge Exam: Vitals:   02/26/19 0416 02/26/19 0844  BP: 122/76   Pulse: 98   Resp: 17   Temp: 98.7 F (37.1 C)   SpO2: 98% 97%   Vitals:   02/25/19 2010 02/25/19 2137 02/26/19 0416 02/26/19 0844  BP:  126/66 122/76   Pulse:  85 98   Resp:  16 17   Temp:  99.4 F (37.4 C) 98.7 F (37.1 C)   TempSrc:  Oral Oral   SpO2: 95% 99% 98% 97%  Weight:      Height:        General: Pt is alert, awake, not in acute distress Cardiovascular: RRR, S1/S2 +, no rubs, no gallops Respiratory: CTA bilaterally, no wheezing, no rhonchi Abdominal: Soft, NT, ND, bowel sounds + Extremities: no edema, no cyanosis   The results of significant diagnostics from this hospitalization (including imaging, microbiology, ancillary and laboratory) are listed below for reference.    Significant Diagnostic Studies: Ct Chest W Contrast  Result Date: 02/22/2019 CLINICAL DATA:  Nausea vomiting diarrhea history of rectal cancer with metastatic disease EXAM: CT CHEST,  ABDOMEN, AND PELVIS WITH CONTRAST TECHNIQUE: Multidetector CT imaging of the chest, abdomen and pelvis was performed following the standard protocol during bolus administration of intravenous contrast. CONTRAST:  119mL OMNIPAQUE IOHEXOL 300 MG/ML  SOLN COMPARISON:  CT 09/22/2018, 07/30/2018, 05/28/2018 FINDINGS: CT CHEST FINDINGS Cardiovascular: Nonaneurysmal aorta. Mild aortic atherosclerosis. Coronary vascular calcification. Normal heart size. No pericardial effusion. Right-sided central venous port with tip at the distal SVC. Mediastinum/Nodes: Midline trachea. No thyroid mass. No increasing mediastinal adenopathy. Esophagus within normal limits. Lungs/Pleura: No acute consolidation or pleural effusion. Stable 6 mm anterior right upper lobe pulmonary nodule, series 3, image number 106, previously 6 mm. Posterior right upper lobe pulmonary nodule, series 3, image number 41 measures 11 mm, previously 11 mm. Posterior right upper lobe 11 mm pulmonary nodule, series 3, image number 75, previously 13 mm. No new pulmonary nodule. Right middle lobe postsurgical changes Musculoskeletal: Stable mixed sclerotic and lucent lesion in the lower sternum. Degenerative changes of the spine. CT ABDOMEN PELVIS FINDINGS Hepatobiliary: Contracted gallbladder without calcified stone. No focal hepatic abnormality. No ductal dilatation Pancreas: Diffusely atrophic. Diffuse pancreatic duct dilatation with truncation at the pancreatic head. Stable 2.1 cm mass at the head of the pancreas. Spleen: Normal in size without focal abnormality. Adrenals/Urinary Tract: Left adrenal calcification. No adrenal mass. Kidneys show no hydronephrosis. Stable subcentimeter exophytic slightly dense lesion off the lower pole left kidney. The bladder is slightly thick walled. Stomach/Bowel: The stomach is nonenlarged. No dilated small bowel. Status post partial proctocolectomy decreased rectal wall thickening compared to most recent prior. Interval left  lower quadrant loop colostomy.  Abnormal appendix. Small appendicoliths. Considerable periappendiceal inflammatory change. Appendix tip dilated up to 2.1 cm. No extraluminal gas. Possible tiny 7 mm fluid collection adjacent to tip of appendix. Vascular/Lymphatic: IVC filter with tip just below the renal vein confluence. Similar protrusion struts beyond the IVC lumen. Nonaneurysmal aorta with mild aortic atherosclerosis. Subcentimeter lymph nodes in the right lower quadrant could be reactive. Reproductive: Prostate calcification Other: Mild presacral soft tissue stranding. No free fluid. No significant pelvic effusion Musculoskeletal: No acute or suspicious osseous abnormality IMPRESSION: 1. Findings consistent with acute appendicitis. No definitive extraluminal gas though considerable inflammatory change about the appendix and possible tiny 7 mm tiny fluid collection adjacent to the tip of the appendix. Appendix: Location: Right lower quadrant Diameter: Up to 21 mm Appendicolith: Positive for stone near the tip Mucosal hyper-enhancement: Negative Extraluminal gas: Negative Periappendiceal collection: Possible tiny 7 mm fluid collection adjacent to the tip of the appendix. 2. Status post partial proctocolectomy with interval left lower quadrant loop colostomy. Decreased rectal wall thickening. 3.  Stable right upper lobe pulmonary nodules. 4. Stable 2 cm mass at the head of pancreas with atrophy ductal obstruction distal to this 5. Stable mixed lucent and sclerotic lesion involving the lower sternum Electronically Signed   By: Donavan Foil M.D.   On: 02/22/2019 21:40   Ct Abdomen Pelvis W Contrast  Result Date: 02/22/2019 CLINICAL DATA:  Nausea vomiting diarrhea history of rectal cancer with metastatic disease EXAM: CT CHEST, ABDOMEN, AND PELVIS WITH CONTRAST TECHNIQUE: Multidetector CT imaging of the chest, abdomen and pelvis was performed following the standard protocol during bolus administration of  intravenous contrast. CONTRAST:  155mL OMNIPAQUE IOHEXOL 300 MG/ML  SOLN COMPARISON:  CT 09/22/2018, 07/30/2018, 05/28/2018 FINDINGS: CT CHEST FINDINGS Cardiovascular: Nonaneurysmal aorta. Mild aortic atherosclerosis. Coronary vascular calcification. Normal heart size. No pericardial effusion. Right-sided central venous port with tip at the distal SVC. Mediastinum/Nodes: Midline trachea. No thyroid mass. No increasing mediastinal adenopathy. Esophagus within normal limits. Lungs/Pleura: No acute consolidation or pleural effusion. Stable 6 mm anterior right upper lobe pulmonary nodule, series 3, image number 106, previously 6 mm. Posterior right upper lobe pulmonary nodule, series 3, image number 41 measures 11 mm, previously 11 mm. Posterior right upper lobe 11 mm pulmonary nodule, series 3, image number 75, previously 13 mm. No new pulmonary nodule. Right middle lobe postsurgical changes Musculoskeletal: Stable mixed sclerotic and lucent lesion in the lower sternum. Degenerative changes of the spine. CT ABDOMEN PELVIS FINDINGS Hepatobiliary: Contracted gallbladder without calcified stone. No focal hepatic abnormality. No ductal dilatation Pancreas: Diffusely atrophic. Diffuse pancreatic duct dilatation with truncation at the pancreatic head. Stable 2.1 cm mass at the head of the pancreas. Spleen: Normal in size without focal abnormality. Adrenals/Urinary Tract: Left adrenal calcification. No adrenal mass. Kidneys show no hydronephrosis. Stable subcentimeter exophytic slightly dense lesion off the lower pole left kidney. The bladder is slightly thick walled. Stomach/Bowel: The stomach is nonenlarged. No dilated small bowel. Status post partial proctocolectomy decreased rectal wall thickening compared to most recent prior. Interval left lower quadrant loop colostomy. Abnormal appendix. Small appendicoliths. Considerable periappendiceal inflammatory change. Appendix tip dilated up to 2.1 cm. No extraluminal gas.  Possible tiny 7 mm fluid collection adjacent to tip of appendix. Vascular/Lymphatic: IVC filter with tip just below the renal vein confluence. Similar protrusion struts beyond the IVC lumen. Nonaneurysmal aorta with mild aortic atherosclerosis. Subcentimeter lymph nodes in the right lower quadrant could be reactive. Reproductive: Prostate calcification Other: Mild presacral soft tissue stranding. No free  fluid. No significant pelvic effusion Musculoskeletal: No acute or suspicious osseous abnormality IMPRESSION: 1. Findings consistent with acute appendicitis. No definitive extraluminal gas though considerable inflammatory change about the appendix and possible tiny 7 mm tiny fluid collection adjacent to the tip of the appendix. Appendix: Location: Right lower quadrant Diameter: Up to 21 mm Appendicolith: Positive for stone near the tip Mucosal hyper-enhancement: Negative Extraluminal gas: Negative Periappendiceal collection: Possible tiny 7 mm fluid collection adjacent to the tip of the appendix. 2. Status post partial proctocolectomy with interval left lower quadrant loop colostomy. Decreased rectal wall thickening. 3.  Stable right upper lobe pulmonary nodules. 4. Stable 2 cm mass at the head of pancreas with atrophy ductal obstruction distal to this 5. Stable mixed lucent and sclerotic lesion involving the lower sternum Electronically Signed   By: Donavan Foil M.D.   On: 02/22/2019 21:40     Microbiology: Recent Results (from the past 240 hour(s))  Blood culture (routine x 2)     Status: None (Preliminary result)   Collection Time: 02/22/19  6:51 PM   Specimen: BLOOD LEFT WRIST  Result Value Ref Range Status   Specimen Description BLOOD LEFT WRIST  Final   Special Requests   Final    BOTTLES DRAWN AEROBIC AND ANAEROBIC Blood Culture adequate volume   Culture   Final    NO GROWTH 4 DAYS Performed at [facility], [address], [place], [place] [address]    Report Status PENDING  Incomplete    Blood culture (routine x 2)     Status: None (Preliminary result)   Collection Time: 02/22/19  6:52 PM   Specimen: Right Antecubital; Blood  Result Value Ref Range Status   Specimen Description RIGHT ANTECUBITAL  Final   Special Requests   Final    BOTTLES DRAWN AEROBIC AND ANAEROBIC Blood Culture adequate volume   Culture   Final    NO GROWTH 4 DAYS Performed at [facility], [address], [place], [place] [address]    Report Status PENDING  Incomplete  Urine culture     Status: Abnormal   Collection Time: 02/22/19  8:13 PM   Specimen: Urine, Clean Catch  Result Value Ref Range Status   Specimen Description   Final    URINE, CLEAN CATCH Performed at [facility], [address], [place], [place] [address]    Special Requests   Final    NONE Performed at [facility], [address] [place], [place] [address]    [place], SUGGEST RECOLLECTION (A)  Final   Report Status 02/24/2019 FINAL  Final  SARS Coronavirus 2 by RT PCR (hospital order, performed in [place] hospital lab) Nasopharyngeal Nasopharyngeal Swab     Status: None   Collection Time: 02/22/19 10:22 PM   Specimen: Nasopharyngeal Swab  Result Value Ref Range Status   SARS Coronavirus 2 NEGATIVE NEGATIVE Final    Comment: (NOTE) If result is NEGATIVE SARS-CoV-2 target nucleic acids are NOT DETECTED. The SARS-CoV-2 RNA is generally detectable in upper and lower  respiratory specimens during the acute phase of infection. The lowest  concentration of SARS-CoV-2 viral copies this assay can detect is 250  copies / mL. A negative result does not preclude SARS-CoV-2 infection  and should not be used as the sole basis for treatment or other  patient management decisions.  A negative result may occur with  improper specimen collection / handling, submission of specimen other  than nasopharyngeal swab, presence of viral mutation(s)  within the  areas targeted by this assay, and inadequate number of  viral copies  (<250 copies / mL). A negative result must be combined with clinical  observations, patient history, and epidemiological information. If result is POSITIVE SARS-CoV-2 target nucleic acids are DETECTED. The SARS-CoV-2 RNA is generally detectable in upper and lower  respiratory specimens dur ing the acute phase of infection.  Positive  results are indicative of active infection with SARS-CoV-2.  Clinical  correlation with patient history and other diagnostic information is  necessary to determine patient infection status.  Positive results do  not rule out bacterial infection or co-infection with other viruses. If result is PRESUMPTIVE POSTIVE SARS-CoV-2 nucleic acids MAY BE PRESENT.   A presumptive positive result was obtained on the submitted specimen  and confirmed on repeat testing.  While 2019 novel coronavirus  (SARS-CoV-2) nucleic acids may be present in the submitted sample  additional confirmatory testing may be necessary for epidemiological  and / or clinical management purposes  to differentiate between  SARS-CoV-2 and other Sarbecovirus currently known to infect humans.  If clinically indicated additional testing with an alternate test  methodology [phone]) is advised. The SARS-CoV-2 RNA is generally  detectable in upper and lower respiratory sp ecimens during the acute  phase of infection. The expected result is Negative. Fact Sheet for Patients:  StrictlyIdeas.no Fact Sheet for Healthcare Providers: BankingDealers.co.za This test is not yet approved or cleared by the [place] FDA and has been authorized for detection and/or diagnosis of SARS-CoV-2 by FDA under an Emergency Use Authorization (EUA).  This EUA will remain in effect (meaning this test can be used) for the duration of the COVID-19 declaration under Section 564(b)(1) of the Act, 21 U.S.C. section 360bbb-3(b)(1), unless the authorization is  terminated or revoked sooner. Performed at [facility], [address], [place], [place] [address]   C difficile quick scan w PCR reflex     Status: None   Collection Time: 02/23/19 10:45 PM   Specimen: STOOL  Result Value Ref Range Status   C Diff antigen NEGATIVE NEGATIVE Final   C Diff toxin NEGATIVE NEGATIVE Final   C Diff interpretation No C. difficile detected.  Final    Comment: Performed at [facility], [address] [place], [place] [address]     Labs: Basic Metabolic Panel: Recent Labs  Lab 02/22/19 1749 02/23/19 0404 02/24/19 0110 02/25/19 0447  NA 132* 137 136 138  K 3.7 3.8 3.7 3.6  CL 101 106 107 107  CO2 22 23 22 23   GLUCOSE 130* 93 168* 134*  BUN 10 8 7* 6*  CREATININE 0.79 0.63 0.66 0.75  CALCIUM 8.0* 7.9* 7.7* 7.8*  MG  --   --  1.6* 2.1   Liver Function Tests: Recent Labs  Lab 02/22/19 1749 02/23/19 0404  AST 14* 13*  ALT 17 15  ALKPHOS 103 95  BILITOT 0.7 0.8  PROT 5.9* 5.2*  ALBUMIN 2.9* 2.5*   Recent Labs  Lab 02/22/19 1749  LIPASE 14   No results for input(s): AMMONIA in the last 168 hours. CBC: Recent Labs  Lab 02/22/19 1749 02/23/19 0404 02/24/19 0110 02/25/19 0447 02/26/19 0609  WBC 10.5 12.3* 12.8* 12.5* 12.3*  NEUTROABS 7.7  --   --   --  8.8*  HGB 12.3* 11.2* 10.7* 10.6* 10.4*  HCT 39.6 35.8* 34.3* 33.9* 33.3*  MCV 93.0 93.2 91.5 92.4 92.8  PLT 254 231 250 290 319   Cardiac Enzymes: No results  for input(s): CKTOTAL, CKMB, CKMBINDEX, TROPONINI in the last 168 hours. BNP: Invalid input(s): POCBNP CBG: Recent Labs  Lab 02/25/19 1607 02/25/19 2222 02/26/19 0022 02/26/19 0416 02/26/19 0723  GLUCAP 133* 146* 130* 117* 123*    Time coordinating discharge:  36 minutes  Signed:  Orson Eva, DO [facility] Pager: [phone] 02/26/2019, 10:23 AM

## 2019-02-26 NOTE — Progress Notes (Addendum)
Pharmacy Antibiotic Note  Charles [name] is a 68 y.o. male with B-cell lymphoma admitted on 02/22/2019 with sepsis/appendicitis.  Pharmacy has been consulted for Zosyn dosing.   Plan: Continue Zosyn 3.375 gr IV q8h extended interval  Monitor labs, c/s, and patient improvement. Plan is for augmentin on discharge (14 day course)   Height: 6\' 2"  (188 cm) Weight: 184 lb 8.4 oz (83.7 kg) IBW/kg (Calculated) : 82.2  Temp (24hrs), Avg:99.1 F (37.3 C), Min:98.7 F (37.1 C), Max:99.4 F (37.4 C)  Recent Labs  Lab 02/22/19 1749 02/22/19 2003 02/23/19 0404 02/24/19 0110 02/25/19 0447 02/26/19 0609  WBC 10.5  --  12.3* 12.8* 12.5* 12.3*  CREATININE 0.79  --  0.63 0.66 0.75  --   LATICACIDVEN 2.7* 2.0*  --   --   --   --     Estimated Creatinine Clearance: 102.8 mL/min (by C-G formula based on SCr of 0.75 mg/dL).    Allergies  Allergen Reactions  . Oxycodone     Blisters, hallucinations  . Tramadol     Blisters, hallucinations  . Trazodone And Nefazodone Other (See Comments)    hallucinations    Antimicrobials this admission: 10/20 Vancomycin >> 10/21 10/20 Cefepime >> 10/21 10/20 Flagyl>> 10/21 10/21 Zosyn >>   Dose adjustments this admission:  Microbiology results: 10/20 BCx: ngtd 10/20 UCx: multiple species 10/20 C.diff: negative 10/20 GI panel: pending   Thank you for allowing pharmacy to be a part of this patient's care.  Margot Ables, PharmD Clinical Pharmacist 02/26/2019 8:24 AM

## 2019-02-26 NOTE — Progress Notes (Addendum)
[facility] Progress Note     Subjective: No major issues. Tolerating diet and pain resolved.   Objective: Vital signs in last 24 hours: Temp:  [98.7 F (37.1 C)-99.4 F (37.4 C)] 98.7 F (37.1 C) (10/24 0416) Pulse Rate:  [85-98] 98 (10/24 0416) Resp:  [16-17] 17 (10/24 0416) BP: (120-126)/(66-76) 122/76 (10/24 0416) SpO2:  [95 %-99 %] 97 % (10/24 0844) Last BM Date: 02/25/19  Intake/Output from previous day: 10/23 0701 - 10/24 0700 In: 720 [P.O.:720] Out: 2750 [Urine:900; Stool:1850] Intake/Output this shift: Total I/O In: 240 [P.O.:240] Out: -   General appearance: alert, cooperative and no distress Resp: normal work of breathing GI: soft, non-tender; bowel sounds normal; no masses,  no organomegaly  Lab Results:  Differential with neutrophils 71 -03/29/19 Recent Labs    02/25/19 0447 02/26/19 0609  WBC 12.5* 12.3*  HGB 10.6* 10.4*  HCT 33.9* 33.3*  PLT 290 319   BMET Recent Labs    02/24/19 0110 02/25/19 0447  NA 136 138  K 3.7 3.6  CL 107 107  CO2 22 23  GLUCOSE 168* 134*  BUN 7* 6*  CREATININE 0.66 0.75  CALCIUM 7.7* 7.8*    Anti-infectives: Anti-infectives (From admission, onward)   Start     Dose/Rate Route Frequency Ordered Stop   02/23/19 1200  piperacillin-tazobactam (ZOSYN) IVPB 3.375 g     3.375 g 12.5 mL/hr over 240 Minutes Intravenous Every 8 hours 02/23/19 0432     02/23/19 1000  vancomycin (VANCOCIN) 1,250 mg in sodium chloride 0.9 % 250 mL IVPB  Status:  Discontinued     1,250 mg 166.7 mL/hr over 90 Minutes Intravenous Every 12 hours 02/22/19 1933 02/23/19 0725   02/23/19 1000  Vancomycin (VANCOCIN) 1,250 mg in sodium chloride 0.9 % 250 mL IVPB  Status:  Discontinued     1,250 mg 166.7 mL/hr over 90 Minutes Intravenous Every 12 hours 02/23/19 0725 02/23/19 1625   02/23/19 0600  piperacillin-tazobactam (ZOSYN) IVPB 2.25 g  Status:  Discontinued     2.25 g 100 mL/hr over 30 Minutes Intravenous  Once 02/22/19 2315  02/22/19 2340   02/23/19 0600  piperacillin-tazobactam (ZOSYN) IVPB 3.375 g     3.375 g 100 mL/hr over 30 Minutes Intravenous 30 min pre-op 02/22/19 2341 02/23/19 0926   02/23/19 0400  ceFEPIme (MAXIPIME) 2 g in sodium chloride 0.9 % 100 mL IVPB  Status:  Discontinued     2 g 200 mL/hr over 30 Minutes Intravenous Every 8 hours 02/22/19 1933 02/23/19 0340   02/22/19 1815  vancomycin (VANCOCIN) 1,500 mg in sodium chloride 0.9 % 500 mL IVPB     1,500 mg 250 mL/hr over 120 Minutes Intravenous  Once 02/22/19 1810 02/22/19 2238   02/22/19 1800  ceFEPIme (MAXIPIME) 2 g in sodium chloride 0.9 % 100 mL IVPB     2 g 200 mL/hr over 30 Minutes Intravenous  Once 02/22/19 1753 02/22/19 1946   02/22/19 1800  metroNIDAZOLE (FLAGYL) IVPB 500 mg     500 mg 100 mL/hr over 60 Minutes Intravenous  Once 02/22/19 1753 02/22/19 2013   02/22/19 1800  vancomycin (VANCOCIN) IVPB 1000 mg/200 mL premix  Status:  Discontinued     1,000 mg 200 mL/hr over 60 Minutes Intravenous  Once 02/22/19 1753 02/22/19 1810      Assessment/Plan: Mr. Hagin is a 68 yo with acute appendicitis with possible rupture in the setting of chemotherapy for metastatic rectal cancer. Doing great.  Differential looks good with  neutrophils down at 71 and WBC steady at 12.3.   Discharge home with Augmentin for 10 more days Will see in clinic 03/08/19, office will call with an appointment Return if worsening pain, fever, chills, low temperature    LOS: 3 days    Virl Cagey 02/26/2019

## 2019-02-26 NOTE — Progress Notes (Signed)
[facility] for heparin Indication: pulmonary embolus  Allergies  Allergen Reactions  . Oxycodone     Blisters, hallucinations  . Tramadol     Blisters, hallucinations  . Trazodone And Nefazodone Other (See Comments)    hallucinations    Patient Measurements: Height: 6\' 2"  (188 cm) Weight: 184 lb 8.4 oz (83.7 kg) IBW/kg (Calculated) : 82.2  HEPARIN DW (KG): 83.7   Vital Signs: Temp: 98.7 F (37.1 C) (10/24 0416) Temp Source: Oral (10/24 0416) BP: 122/76 (10/24 0416) Pulse Rate: 98 (10/24 0416)  Labs: Recent Labs    02/23/19 1552  02/24/19 0110  02/25/19 0102 02/25/19 0447 02/25/19 0449 02/26/19 0609  HGB  --    < > 10.7*  --   --  10.6*  --  10.4*  HCT  --   --  34.3*  --   --  33.9*  --  33.3*  PLT  --   --  250  --   --  290  --  319  APTT 51*  --  >200*  --   --   --   --   --   HEPARINUNFRC <0.10*  --  0.55   < > 0.54  --  0.62 0.60  CREATININE  --   --  0.66  --   --  0.75  --   --    < > = values in this interval not displayed.    Estimated Creatinine Clearance: 102.8 mL/min (by C-G formula based on SCr of 0.75 mg/dL).   Assessment:  Pharmacy consulted to dose heparin infusion for this 68 yo male for acute PE. He was taking rivaroxaban 20mg  daily at home PTA, with his last dose on 02-21-19.  Baseline CBC is WNL. Noted that baseline heparin level was undetectable so will only need to utilize heparin levels as Xarelto no longer affecting levels.  HL therapeutic at 0.60   Goal of Therapy:  Heparin level 0.3-0.7 units/ml Monitor platelets by anticoagulation protocol: Yes   Plan:  Continuee heparin infusion at 1250 units/hr Will check anti-Xa level daily Continue to monitor H&H and platelets   Margot Ables, PharmD Clinical Pharmacist 02/26/2019 8:21 AM

## 2019-02-27 LAB — CULTURE, BLOOD (ROUTINE X 2)
Culture: NO GROWTH
Culture: NO GROWTH
Special Requests: ADEQUATE
Special Requests: ADEQUATE

## 2019-02-27 LAB — GI PATHOGEN PANEL BY PCR, STOOL
Adenovirus F 40/41: NOT DETECTED
Astrovirus: NOT DETECTED
Campylobacter by PCR: NOT DETECTED
Cryptosporidium by PCR: NOT DETECTED
Cyclospora cayetanensis: NOT DETECTED
E coli (ETEC) LT/ST: NOT DETECTED
E coli (STEC): DETECTED — AB
E coli 0157 by PCR: NOT DETECTED
Entamoeba histolytica: NOT DETECTED
Enteroaggregative E coli: NOT DETECTED
G lamblia by PCR: NOT DETECTED
Norovirus GI/GII: NOT DETECTED
Plesiomonas shigelloides: NOT DETECTED
Rotavirus A by PCR: NOT DETECTED
Salmonella by PCR: NOT DETECTED
Sapovirus: NOT DETECTED
Shigella by PCR: NOT DETECTED
Vibrio cholerae: NOT DETECTED
Vibrio: NOT DETECTED
Yersinia enterocolitica: NOT DETECTED

## 2019-03-02 DIAGNOSIS — Z6821 Body mass index (BMI) 21.0-21.9, adult: Secondary | ICD-10-CM | POA: Diagnosis not present

## 2019-03-02 DIAGNOSIS — Z299 Encounter for prophylactic measures, unspecified: Secondary | ICD-10-CM | POA: Diagnosis not present

## 2019-03-02 DIAGNOSIS — I10 Essential (primary) hypertension: Secondary | ICD-10-CM | POA: Diagnosis not present

## 2019-03-02 DIAGNOSIS — K3580 Unspecified acute appendicitis: Secondary | ICD-10-CM | POA: Diagnosis not present

## 2019-03-02 DIAGNOSIS — I2699 Other pulmonary embolism without acute cor pulmonale: Secondary | ICD-10-CM | POA: Diagnosis not present

## 2019-03-02 DIAGNOSIS — C20 Malignant neoplasm of rectum: Secondary | ICD-10-CM | POA: Diagnosis not present

## 2019-03-02 DIAGNOSIS — E1165 Type 2 diabetes mellitus with hyperglycemia: Secondary | ICD-10-CM | POA: Diagnosis not present

## 2019-03-08 ENCOUNTER — Encounter: Payer: Self-pay | Admitting: General Surgery

## 2019-03-08 ENCOUNTER — Other Ambulatory Visit: Payer: Self-pay

## 2019-03-08 ENCOUNTER — Ambulatory Visit (INDEPENDENT_AMBULATORY_CARE_PROVIDER_SITE_OTHER): Payer: Medicare Other | Admitting: General Surgery

## 2019-03-08 VITALS — BP 129/87 | HR 119 | Temp 97.4°F | Resp 18 | Ht 74.0 in | Wt 165.0 lb

## 2019-03-08 DIAGNOSIS — K3580 Unspecified acute appendicitis: Secondary | ICD-10-CM | POA: Diagnosis not present

## 2019-03-08 NOTE — Progress Notes (Signed)
[facility] Note   HPI:  68 y.o. Male presents to clinic for follow-up evaluation of his perforated appendicitis in the setting of metastatic rectal cancer. He is finishing up antibiotics. He is feeling well but does not have a great appetite and says he has cramping with some eating. He is having stool out of his ostomy. .   Review of Systems:  Some poor appetite Some cramping with food  All other review of systems: otherwise negative   Vital Signs:  BP 129/87 (BP Location: Left Arm, Patient Position: Sitting, Cuff Size: Normal)   Pulse (!) 119   Temp (!) 97.4 F (36.3 C) (Oral)   Resp 18   Ht 6\' 2"  (1.88 m)   Wt 165 lb (74.8 kg)   SpO2 97%   BMI 21.18 kg/m    Physical Exam:  Physical Exam Vitals signs reviewed.  HENT:     Head: Normocephalic.  Cardiovascular:     Rate and Rhythm: Normal rate.  Pulmonary:     Effort: Pulmonary effort is normal.  Abdominal:     General: There is no distension.     Palpations: Abdomen is soft.     Tenderness: There is no abdominal tenderness.     Hernia: No hernia is present.     Assessment:  68 y.o. yo Male with a recent perforated acute appendicitis in the setting of metastatic rectal cancer. Doing better after antibiotics. We have discussed the options and the risk of no surgery due to his appendicolith and the recurrence rate of appendicitis with appendicoliths. We have also discussed the real possibility of poor healing and open procedure due to his prior surgeries.    Plan:  - Will see back in 4 weeks and finalize our plans   - Have notified Dr. [name] of the situation   All of the above recommendations were discussed with the patient and patient's family, and all of patient's and family's questions were answered to their expressed satisfaction.  Curlene Labrum, MD [facility] [address] [place], [place] [address] [phone] (office)

## 2019-03-08 NOTE — Patient Instructions (Signed)
Appendicitis, Adult  The appendix is a tube in the body that is shaped like a finger. It is attached to the large intestine. Appendicitis means that this tube is swollen (inflamed). If this is not treated, the tube can tear (rupture). This can lead to a life-threatening infection. This condition can also cause pus to build up in the appendix (abscess). What are the causes? This condition may be caused by something that blocks the appendix. These include:  A ball of poop (stool).  Lymph glands that are bigger than normal. Sometimes the cause is not known. What increases the risk? You are more likely to develop this condition if you are between 68 and 65 years of age. What are the signs or symptoms? Symptoms of this condition include:  Pain around the belly button (navel). ? The pain moves toward the lower right belly (abdomen). ? The pain can get worse with time. ? The pain can get worse if you cough. ? The pain can get worse if you move suddenly.  Tenderness in the lower right belly.  Feeling sick to your stomach (nauseous).  Throwing up (vomiting).  Not feeling hungry (loss of appetite).  A fever.  Having trouble pooping (constipation).  Watery poop (diarrhea).  Not feeling well. How is this treated? Most often, this condition is treated by taking out the appendix (appendectomy). There are two ways to do this:  Open surgery. For this method, the appendix is taken out through a large cut (incision). The cut is made in the lower right belly. This surgery may be used if: ? You have scars from another surgery. ? You have a bleeding condition. ? You are pregnant and will be having your baby soon. ? You have a condition that makes it hard to do the other type of surgery.  Laparoscopic surgery. For this method, the appendix is taken out through small cuts. Often, this surgery: ? Causes less pain. ? Causes fewer problems. ? Is easier to heal from. If your appendix tears and  pus forms:  A drain may be put into the sore. The drain will be used to get rid of the pus.  You may get an antibiotic medicine through an IV line.  Your appendix may or may not need to be taken out. Follow these instructions at home: If you had surgery, follow instructions from your doctor on how to care for yourself at home and how to take care of your cut from surgery. Medicines  Take over-the-counter and prescription medicines only as told by your doctor.  If you were prescribed an antibiotic medicine, take it as told by your doctor. Do not stop taking the antibiotic even if you start to feel better. Eating and drinking Follow instructions from your doctor about what you cannot eat or drink. You may go back to your diet slowly if:  You no longer feel sick to your stomach.  You have stopped throwing up. General instructions  Do not use any products that contain nicotine or tobacco, such as cigarettes, e-cigarettes, and chewing tobacco. If you need help quitting, ask your doctor.  Do not drive or use heavy machinery while taking prescription pain medicine.  Ask your doctor if the medicine you are taking can cause trouble pooping. You may need to take steps to prevent or treat trouble pooping: ? Drink enough fluid to keep your pee (urine) pale yellow. ? Take over-the-counter or prescription medicines. ? Eat foods that are high in fiber. These include beans,  whole grains, and fresh fruits and vegetables. ? Limit foods that are high in fat and sugar. These include fried or sweet foods.  Keep all follow-up visits as told by your doctor. This is important. Contact a doctor if:  There is pus, blood, or a lot of fluid coming from your cut or cuts from surgery.  You are sick to your stomach or you throw up. Get help right away if:  You have pain in your belly, and the pain is getting worse.  You have a fever.  You have chills.  You are very tired.  You have muscle pain.   You are short of breath. Summary  Appendicitis is swelling of the appendix. The appendix is a tube that is shaped like a finger. It is joined to the large intestine.  This condition may be caused by something that blocks the appendix. This can lead to an infection.  This condition is usually treated by taking out the appendix. This information is not intended to replace advice given to you by your health care provider. Make sure you discuss any questions you have with your health care provider. Document Released: 07/14/2011 Document Revised: 10/07/2017 Document Reviewed: 10/07/2017 Elsevier Patient Education  2020 [employer].

## 2019-03-10 ENCOUNTER — Encounter (HOSPITAL_COMMUNITY): Payer: Self-pay | Admitting: Emergency Medicine

## 2019-03-10 ENCOUNTER — Emergency Department (HOSPITAL_COMMUNITY): Payer: Medicare Other

## 2019-03-10 ENCOUNTER — Other Ambulatory Visit: Payer: Self-pay

## 2019-03-10 ENCOUNTER — Inpatient Hospital Stay (HOSPITAL_COMMUNITY)
Admission: EM | Admit: 2019-03-10 | Discharge: 2019-03-13 | DRG: 372 | Disposition: A | Discharge status: Home / Self Care | Payer: Medicare Other | Source: Ambulatory Visit | Attending: Internal Medicine | Admitting: Internal Medicine

## 2019-03-10 DIAGNOSIS — E1151 Type 2 diabetes mellitus with diabetic peripheral angiopathy without gangrene: Secondary | ICD-10-CM | POA: Diagnosis present

## 2019-03-10 DIAGNOSIS — Z923 Personal history of irradiation: Secondary | ICD-10-CM

## 2019-03-10 DIAGNOSIS — Z7189 Other specified counseling: Secondary | ICD-10-CM | POA: Diagnosis not present

## 2019-03-10 DIAGNOSIS — Z888 Allergy status to other drugs, medicaments and biological substances status: Secondary | ICD-10-CM

## 2019-03-10 DIAGNOSIS — Z681 Body mass index (BMI) 19 or less, adult: Secondary | ICD-10-CM | POA: Diagnosis not present

## 2019-03-10 DIAGNOSIS — E871 Hypo-osmolality and hyponatremia: Secondary | ICD-10-CM | POA: Diagnosis present

## 2019-03-10 DIAGNOSIS — C20 Malignant neoplasm of rectum: Secondary | ICD-10-CM | POA: Diagnosis present

## 2019-03-10 DIAGNOSIS — R52 Pain, unspecified: Secondary | ICD-10-CM | POA: Diagnosis not present

## 2019-03-10 DIAGNOSIS — Z9049 Acquired absence of other specified parts of digestive tract: Secondary | ICD-10-CM

## 2019-03-10 DIAGNOSIS — Z79899 Other long term (current) drug therapy: Secondary | ICD-10-CM | POA: Diagnosis not present

## 2019-03-10 DIAGNOSIS — Z885 Allergy status to narcotic agent status: Secondary | ICD-10-CM | POA: Diagnosis not present

## 2019-03-10 DIAGNOSIS — Z299 Encounter for prophylactic measures, unspecified: Secondary | ICD-10-CM | POA: Diagnosis not present

## 2019-03-10 DIAGNOSIS — F329 Major depressive disorder, single episode, unspecified: Secondary | ICD-10-CM | POA: Diagnosis present

## 2019-03-10 DIAGNOSIS — Z85048 Personal history of other malignant neoplasm of rectum, rectosigmoid junction, and anus: Secondary | ICD-10-CM | POA: Diagnosis not present

## 2019-03-10 DIAGNOSIS — Z7901 Long term (current) use of anticoagulants: Secondary | ICD-10-CM

## 2019-03-10 DIAGNOSIS — M2550 Pain in unspecified joint: Secondary | ICD-10-CM | POA: Diagnosis not present

## 2019-03-10 DIAGNOSIS — K3533 Acute appendicitis with perforation and localized peritonitis, with abscess: Principal | ICD-10-CM | POA: Diagnosis present

## 2019-03-10 DIAGNOSIS — Z933 Colostomy status: Secondary | ICD-10-CM | POA: Diagnosis not present

## 2019-03-10 DIAGNOSIS — Z0000 Encounter for general adult medical examination without abnormal findings: Secondary | ICD-10-CM | POA: Diagnosis not present

## 2019-03-10 DIAGNOSIS — K449 Diaphragmatic hernia without obstruction or gangrene: Secondary | ICD-10-CM | POA: Diagnosis present

## 2019-03-10 DIAGNOSIS — D649 Anemia, unspecified: Secondary | ICD-10-CM | POA: Diagnosis present

## 2019-03-10 DIAGNOSIS — I2699 Other pulmonary embolism without acute cor pulmonale: Secondary | ICD-10-CM | POA: Diagnosis present

## 2019-03-10 DIAGNOSIS — C7889 Secondary malignant neoplasm of other digestive organs: Secondary | ICD-10-CM | POA: Diagnosis present

## 2019-03-10 DIAGNOSIS — Z86718 Personal history of other venous thrombosis and embolism: Secondary | ICD-10-CM

## 2019-03-10 DIAGNOSIS — R1031 Right lower quadrant pain: Secondary | ICD-10-CM | POA: Diagnosis not present

## 2019-03-10 DIAGNOSIS — K651 Peritoneal abscess: Secondary | ICD-10-CM

## 2019-03-10 DIAGNOSIS — K219 Gastro-esophageal reflux disease without esophagitis: Secondary | ICD-10-CM | POA: Diagnosis present

## 2019-03-10 DIAGNOSIS — Z902 Acquired absence of lung [part of]: Secondary | ICD-10-CM

## 2019-03-10 DIAGNOSIS — R5383 Other fatigue: Secondary | ICD-10-CM | POA: Diagnosis not present

## 2019-03-10 DIAGNOSIS — Z86711 Personal history of pulmonary embolism: Secondary | ICD-10-CM | POA: Diagnosis not present

## 2019-03-10 DIAGNOSIS — Z125 Encounter for screening for malignant neoplasm of prostate: Secondary | ICD-10-CM | POA: Diagnosis not present

## 2019-03-10 DIAGNOSIS — R5381 Other malaise: Secondary | ICD-10-CM | POA: Diagnosis not present

## 2019-03-10 DIAGNOSIS — Z1339 Encounter for screening examination for other mental health and behavioral disorders: Secondary | ICD-10-CM | POA: Diagnosis not present

## 2019-03-10 DIAGNOSIS — Z7401 Bed confinement status: Secondary | ICD-10-CM | POA: Diagnosis not present

## 2019-03-10 DIAGNOSIS — C7801 Secondary malignant neoplasm of right lung: Secondary | ICD-10-CM | POA: Diagnosis present

## 2019-03-10 DIAGNOSIS — Z7984 Long term (current) use of oral hypoglycemic drugs: Secondary | ICD-10-CM | POA: Diagnosis not present

## 2019-03-10 DIAGNOSIS — F29 Unspecified psychosis not due to a substance or known physiological condition: Secondary | ICD-10-CM | POA: Diagnosis not present

## 2019-03-10 DIAGNOSIS — F419 Anxiety disorder, unspecified: Secondary | ICD-10-CM | POA: Diagnosis not present

## 2019-03-10 DIAGNOSIS — Z1331 Encounter for screening for depression: Secondary | ICD-10-CM | POA: Diagnosis not present

## 2019-03-10 DIAGNOSIS — Z87442 Personal history of urinary calculi: Secondary | ICD-10-CM

## 2019-03-10 DIAGNOSIS — E119 Type 2 diabetes mellitus without complications: Secondary | ICD-10-CM

## 2019-03-10 DIAGNOSIS — I82409 Acute embolism and thrombosis of unspecified deep veins of unspecified lower extremity: Secondary | ICD-10-CM | POA: Diagnosis present

## 2019-03-10 DIAGNOSIS — I10 Essential (primary) hypertension: Secondary | ICD-10-CM | POA: Diagnosis present

## 2019-03-10 DIAGNOSIS — Z9221 Personal history of antineoplastic chemotherapy: Secondary | ICD-10-CM

## 2019-03-10 DIAGNOSIS — Z20828 Contact with and (suspected) exposure to other viral communicable diseases: Secondary | ICD-10-CM | POA: Diagnosis present

## 2019-03-10 LAB — COMPREHENSIVE METABOLIC PANEL
ALT: 15 U/L (ref 0–44)
AST: 17 U/L (ref 15–41)
Albumin: 2.9 g/dL — ABNORMAL LOW (ref 3.5–5.0)
Alkaline Phosphatase: 72 U/L (ref 38–126)
Anion gap: 10 (ref 5–15)
BUN: 15 mg/dL (ref 8–23)
CO2: 24 mmol/L (ref 22–32)
Calcium: 8.4 mg/dL — ABNORMAL LOW (ref 8.9–10.3)
Chloride: 95 mmol/L — ABNORMAL LOW (ref 98–111)
Creatinine, Ser: 1.11 mg/dL (ref 0.61–1.24)
GFR calc Af Amer: >60 mL/min (ref >60)
GFR calc non Af Amer: >60 mL/min (ref >60)
Glucose, Bld: 253 mg/dL — ABNORMAL HIGH (ref 70–99)
Potassium: 4.2 mmol/L (ref 3.5–5.1)
Sodium: 129 mmol/L — ABNORMAL LOW (ref 135–145)
Total Bilirubin: 1.3 mg/dL — ABNORMAL HIGH (ref 0.3–1.2)
Total Protein: 7 g/dL (ref 6.5–8.1)

## 2019-03-10 LAB — URINALYSIS, ROUTINE W REFLEX MICROSCOPIC
Bacteria, UA: NONE SEEN
Bilirubin Urine: NEGATIVE
Glucose, UA: 50 mg/dL — AB
Hgb urine dipstick: NEGATIVE
Ketones, ur: NEGATIVE mg/dL
Leukocytes,Ua: NEGATIVE
Nitrite: NEGATIVE
Protein, ur: 100 mg/dL — AB
Specific Gravity, Urine: 1.024 (ref 1.005–1.030)
pH: 5 (ref 5.0–8.0)

## 2019-03-10 LAB — CBC WITH DIFFERENTIAL/PLATELET
Abs Immature Granulocytes: 0.07 10*3/uL (ref 0.00–0.07)
Basophils Absolute: 0.1 10*3/uL (ref 0.0–0.1)
Basophils Relative: 0 %
Eosinophils Absolute: 0 10*3/uL (ref 0.0–0.5)
Eosinophils Relative: 0 %
HCT: 35.7 % — ABNORMAL LOW (ref 39.0–52.0)
Hemoglobin: 11 g/dL — ABNORMAL LOW (ref 13.0–17.0)
Immature Granulocytes: 0 %
Lymphocytes Relative: 9 %
Lymphs Abs: 1.4 10*3/uL (ref 0.7–4.0)
MCH: 28.1 pg (ref 26.0–34.0)
MCHC: 30.8 g/dL (ref 30.0–36.0)
MCV: 91.3 fL (ref 80.0–100.0)
Monocytes Absolute: 1.4 10*3/uL — ABNORMAL HIGH (ref 0.1–1.0)
Monocytes Relative: 9 %
Neutro Abs: 13.2 10*3/uL — ABNORMAL HIGH (ref 1.7–7.7)
Neutrophils Relative %: 82 %
Platelets: 421 10*3/uL — ABNORMAL HIGH (ref 150–400)
RBC: 3.91 MIL/uL — ABNORMAL LOW (ref 4.22–5.81)
RDW: 14.8 % (ref 11.5–15.5)
WBC: 16.2 10*3/uL — ABNORMAL HIGH (ref 4.0–10.5)
nRBC: 0 % (ref 0.0–0.2)

## 2019-03-10 LAB — LIPASE, BLOOD: Lipase: 16 U/L (ref 11–51)

## 2019-03-10 LAB — LACTIC ACID, PLASMA
Lactic Acid, Venous: 2.5 mmol/L (ref 0.5–1.9)
Lactic Acid, Venous: 1.2 mmol/L (ref 0.5–1.9)

## 2019-03-10 MED ORDER — ENOXAPARIN SODIUM 40 MG/0.4ML ~~LOC~~ SOLN
40.0000 mg | SUBCUTANEOUS | Status: DC
  Administered 2019-03-10: 40 mg via SUBCUTANEOUS
  Filled 2019-03-10: qty 0.4

## 2019-03-10 MED ORDER — ONDANSETRON HCL 4 MG/2ML IJ SOLN
4.0000 mg | Freq: Once | INTRAMUSCULAR | Status: AC
  Administered 2019-03-10: 4 mg via INTRAVENOUS
  Filled 2019-03-10: qty 2

## 2019-03-10 MED ORDER — SODIUM CHLORIDE 0.9 % IV SOLN
250.0000 mL | INTRAVENOUS | Status: DC | PRN
  Administered 2019-03-12: 250 mL via INTRAVENOUS

## 2019-03-10 MED ORDER — MIRTAZAPINE 15 MG PO TABS
7.5000 mg | ORAL_TABLET | Freq: Every day | ORAL | Status: DC
  Administered 2019-03-10 – 2019-03-12 (×3): 7.5 mg via ORAL
  Filled 2019-03-10 (×3): qty 1

## 2019-03-10 MED ORDER — SODIUM CHLORIDE 0.9% FLUSH
3.0000 mL | Freq: Two times a day (BID) | INTRAVENOUS | Status: DC
  Administered 2019-03-11 – 2019-03-13 (×4): 3 mL via INTRAVENOUS

## 2019-03-10 MED ORDER — ACETAMINOPHEN 325 MG PO TABS
650.0000 mg | ORAL_TABLET | Freq: Four times a day (QID) | ORAL | Status: DC | PRN
  Administered 2019-03-11: 650 mg via ORAL
  Filled 2019-03-10: qty 2

## 2019-03-10 MED ORDER — ACETAMINOPHEN 650 MG RE SUPP: 650.0000 mg | Freq: Four times a day (QID) | RECTAL | Status: DC | PRN

## 2019-03-10 MED ORDER — PIPERACILLIN-TAZOBACTAM 3.375 G IVPB 30 MIN
3.3750 g | Freq: Four times a day (QID) | INTRAVENOUS | Status: DC
  Administered 2019-03-10 – 2019-03-11 (×2): 3.375 g via INTRAVENOUS
  Filled 2019-03-10 (×15): qty 50

## 2019-03-10 MED ORDER — VITAMIN B-12 1000 MCG PO TABS
1000.0000 ug | ORAL_TABLET | Freq: Every day | ORAL | Status: DC
  Administered 2019-03-12 – 2019-03-13 (×2): 1000 ug via ORAL
  Filled 2019-03-10 (×6): qty 1

## 2019-03-10 MED ORDER — HYDROMORPHONE HCL 1 MG/ML IJ SOLN
0.5000 mg | Freq: Once | INTRAMUSCULAR | Status: AC
  Administered 2019-03-10: 0.5 mg via INTRAVENOUS
  Filled 2019-03-10: qty 1

## 2019-03-10 MED ORDER — CLONAZEPAM 0.5 MG PO TABS: 1.0000 mg | ORAL_TABLET | Freq: Three times a day (TID) | ORAL | Status: DC | PRN

## 2019-03-10 MED ORDER — MORPHINE SULFATE (PF) 2 MG/ML IV SOLN
1.0000 mg | INTRAVENOUS | Status: DC | PRN
  Administered 2019-03-10 – 2019-03-11 (×3): 1 mg via INTRAVENOUS
  Filled 2019-03-10 (×3): qty 1

## 2019-03-10 MED ORDER — IOHEXOL 300 MG/ML  SOLN
100.0000 mL | Freq: Once | INTRAMUSCULAR | Status: AC | PRN
  Administered 2019-03-10: 100 mL via INTRAVENOUS

## 2019-03-10 MED ORDER — ONDANSETRON HCL 4 MG/2ML IJ SOLN
4.0000 mg | Freq: Four times a day (QID) | INTRAMUSCULAR | Status: DC | PRN
  Administered 2019-03-11: 08:00:00 4 mg via INTRAVENOUS
  Filled 2019-03-10: qty 2

## 2019-03-10 MED ORDER — ONDANSETRON HCL 4 MG PO TABS
4.0000 mg | ORAL_TABLET | Freq: Four times a day (QID) | ORAL | Status: DC | PRN
  Administered 2019-03-12: 4 mg via ORAL
  Filled 2019-03-10: qty 1

## 2019-03-10 MED ORDER — IOHEXOL 9 MG/ML PO SOLN
ORAL | Status: AC
  Administered 2019-03-10: 19:00:00
  Filled 2019-03-10: qty 1000

## 2019-03-10 MED ORDER — SODIUM CHLORIDE 0.9 % IV BOLUS
1000.0000 mL | Freq: Once | INTRAVENOUS | Status: AC
  Administered 2019-03-10: 1000 mL via INTRAVENOUS

## 2019-03-10 MED ORDER — GABAPENTIN 300 MG PO CAPS
600.0000 mg | ORAL_CAPSULE | Freq: Two times a day (BID) | ORAL | Status: DC
  Administered 2019-03-10 – 2019-03-13 (×5): 600 mg via ORAL
  Filled 2019-03-10 (×5): qty 2

## 2019-03-10 MED ORDER — PANTOPRAZOLE SODIUM 40 MG IV SOLR
40.0000 mg | INTRAVENOUS | Status: DC
  Administered 2019-03-10 – 2019-03-12 (×3): 40 mg via INTRAVENOUS
  Filled 2019-03-10 (×3): qty 40

## 2019-03-10 MED ORDER — LACTATED RINGERS IV SOLN
INTRAVENOUS | Status: DC
  Administered 2019-03-11: via INTRAVENOUS

## 2019-03-10 MED ORDER — PIPERACILLIN-TAZOBACTAM 3.375 G IVPB 30 MIN
3.3750 g | Freq: Once | INTRAVENOUS | Status: AC
  Administered 2019-03-10: 3.375 g via INTRAVENOUS
  Filled 2019-03-10: qty 50

## 2019-03-10 MED ORDER — ACETAMINOPHEN 325 MG PO TABS
650.0000 mg | ORAL_TABLET | Freq: Once | ORAL | Status: AC
  Administered 2019-03-10: 18:00:00 650 mg via ORAL
  Filled 2019-03-10: qty 2

## 2019-03-10 MED ORDER — BISACODYL 10 MG RE SUPP: 10.0000 mg | Freq: Every day | RECTAL | Status: DC | PRN

## 2019-03-10 MED ORDER — POLYETHYLENE GLYCOL 3350 17 G PO PACK: 17.0000 g | PACK | Freq: Every day | ORAL | Status: DC | PRN

## 2019-03-10 MED ORDER — SODIUM CHLORIDE 0.9% FLUSH: 3.0000 mL | INTRAVENOUS | Status: DC | PRN

## 2019-03-10 NOTE — ED Notes (Signed)
Patient transported to CT 

## 2019-03-10 NOTE — ED Provider Notes (Signed)
[facility] EMERGENCY DEPARTMENT Provider Note   CSN: [id number] Arrival date & time: 03/10/19  1405     History   Chief Complaint Chief Complaint  Patient presents with   Abdominal Pain   Emesis   Diarrhea    HPI Charles [name] is a 68 y.o. male.     68 year old male with past medical history of metastatic rectal cancer, last chemo 3 weeks ago, sent to the ER by Dr. Constance Haw with general surgery, patient was admitted to the hospital October 20-24 for perforated appendicitis, treated with IV antibiotics.  Patient reports today having nausea and vomiting, ongoing abdominal pain, reports having chills but states he always has chills, no documented fever.  Patient has a colostomy, reports normal stool output.     Past Medical History:  Diagnosis Date   Chronic anticoagulation    Colon cancer ([place]) 07/24/2010   rectal ca, inv adenocarcinoma   Depression 04/01/2011   Diabetes mellitus without complication ([place])    DVT (deep venous thrombosis) ([place]) 05/09/2011   GERD (gastroesophageal reflux disease)    High output ileostomy ([place]) 05/21/2011   History of kidney stones    HTN (hypertension)    Hx of radiation therapy 09/02/10 to 10/14/10   pelvis   Lung metastasis ([place])    Neuropathy    Peripheral vascular disease ([place])    dvt's,pe   Pneumonia    hx x3   Pulmonary embolism ([place])    Rectal cancer ([place]) 01/15/2011   S/P radiation and concurrent 5-FU continuous infusion from 09/09/10- 10/10/10.  S/P proctectomy with colorectal anastomosis and diverting loop ileostomy on 11/14/10 at [facility] by Dr. Harlon Ditty. Pathology reveals a pT3b N1 with 3/20 lymph nodes.      Patient Active Problem List   Diagnosis Date Noted   Appendicular abscess 03/10/2019   Appendicitis 02/23/2019   Sepsis due to undetermined organism ([place]) 02/23/2019   Nausea, vomiting and diarrhea    SIRS (systemic inflammatory response syndrome) ([place]) 10/16/2018   Metastatic colorectal  cancer ([place])    Anastomotic stricture of colorectal region    Abnormal CT scan, colon    Diarrhea in adult patient 09/22/2018   History of pulmonary embolism 09/22/2018   Hypokalemia 09/22/2018   Type 2 diabetes mellitus ([place]) 09/22/2018   Hypomagnesemia 09/22/2018   Dilated gallbladder 09/22/2018   Goals of care, counseling/discussion 09/20/2018   Fecal incontinence 09/23/2017   Community acquired pneumonia of right lower lobe of lung 07/23/2016   Abnormal pancreas function test    Rectal pain 05/30/2016   Hiatal hernia    Schatzki's ring    GI bleed 11/23/2014   Gastritis 11/23/2014   S/P thoracotomy 11/23/2014   S/P partial lobectomy of lung 11/15/2014   DVT (deep venous thrombosis) (HCC)    Lung nodule    Lung nodule, solitary    Acute pulmonary embolism ([place]) 09/08/2014   Lactic acidosis 09/08/2014   Lower abdominal pain 09/08/2014   Urinary retention 09/08/2014   Diarrhea 08/01/2014   Peripheral neuropathy due to oxaliplatin-chemotherapy 07/12/2014   Stiffness of joint, not elsewhere classified, ankle and foot 01/31/2014   Weakness of both legs 01/31/2014   Hx of radiation therapy    Dehydration 03/10/2011   Rectal cancer ([place]) 01/15/2011   Colon cancer ([place]) 07/24/2010   GERD 07/11/2010    Past Surgical History:  Procedure Laterality Date   BILIARY STENT PLACEMENT N/A 09/16/2018   Procedure: STENT PLACEMENT;  Surgeon: Rogene Houston, MD;  Location: [facility]  ENDO SUITE;  Service: Endoscopy;  Laterality: N/A;   BIOPSY  09/24/2018   Procedure: BIOPSY;  Surgeon: Danie Binder, MD;  Location: [facility] ENDO SUITE;  Service: Endoscopy;;   COLON SURGERY  11/14/2010   proctectomy with colorectal anastomosis and diverting loop ileostomy (temporary planned)   COLONOSCOPY  07/2010   proximal rectal apple core mass 10-14cm from anal verge (adenocarcinoma), 2-3cm distal rectal carpet polyp s/p piecemeal snare polypectomy (adenoma)   COLONOSCOPY   04/21/2012   RMR: Friable,fibrotic appearing colorectal anastomosis producing some luminal narrowing-not felt to be critical. path: focal erosion with slight inflammation and hyperemia. SURVEILLANCE DUE DEC 2015   COLONOSCOPY N/A 11/24/2013   Dr. Rourk:somewhat fibrotic/friable anastomotic mucosal-status post biopsy (narrowing not felt to be clinically significant) Single colonic diverticulum. benign polypoid rectal mucosa   colostomy reversal  april 2013   ERCP N/A 09/16/2018   Procedure: ENDOSCOPIC RETROGRADE CHOLANGIOPANCREATOGRAPHY (ERCP);  Surgeon: Rogene Houston, MD;  Location: [facility] ENDO SUITE;  Service: Endoscopy;  Laterality: N/A;   ESOPHAGOGASTRODUODENOSCOPY  07/2010   RMR: schatki ring s/p dilation, small hh, SB bx benign   ESOPHAGOGASTRODUODENOSCOPY N/A 09/04/2015   Procedure: ESOPHAGOGASTRODUODENOSCOPY (EGD);  Surgeon: Daneil Dolin, MD;  Location: [facility] ENDO SUITE;  Service: Endoscopy;  Laterality: N/A;  730   EUS  08/2010   Dr. Owens Loffler. uT3N0 circumferential, nearly obstruction rectosigmoid adenocarcinoma, distal edge 12cm from anal verge   EUS N/A 06/19/2016   Procedure: UPPER ENDOSCOPIC ULTRASOUND (EUS) RADIAL;  Surgeon: Milus Banister, MD;  Location: [facility] ENDOSCOPY;  Service: Endoscopy;  Laterality: N/A;   FLEXIBLE SIGMOIDOSCOPY N/A 09/24/2018   Procedure: FLEXIBLE SIGMOIDOSCOPY;  Surgeon: Danie Binder, MD;  Location: [facility] ENDO [facility];  Service: Endoscopy;  Laterality: N/A;   HERNIA REPAIR     abd hernia repair   IVC filter     ivc filter     port a cath placement     PORT-A-CATH REMOVAL  09/24/2011   Procedure: REMOVAL PORT-A-CATH;  Surgeon: Donato Heinz, MD;  Location: [facility] ORS;  Service: General;  Laterality: N/A;  Minor Room   PORTACATH PLACEMENT Right 07/07/2016   Procedure: INSERTION PORT-A-CATH;  Surgeon: Aviva Signs, MD;  Location: [facility] ORS;  Service: General;  Laterality: Right;   SPHINCTEROTOMY N/A 09/16/2018   Procedure: SPHINCTEROTOMY with balloon  dialation;  Surgeon: Rogene Houston, MD;  Location: [facility] END[facility];  Service: Endoscopy;  Laterality: N/A;   TRANSVERSE LOOP COLOSTOMY N/A 09/29/2018   Procedure: TRANSVERSE LOOP COLOSTOMY;  Surgeon: Aviva Signs, MD;  Location: [facility] ORS;  Service: General;  Laterality: N/A;   VIDEO ASSISTED THORACOSCOPY (VATS)/WEDGE RESECTION Right 11/15/2014   Procedure: VIDEO ASSISTED THORACOSCOPY (VATS)/LUNG RESECTION WITH RIGHT LINGULECTOMY;  Surgeon: [name], MD;  Location: [facility];  Service: Thoracic;  Laterality: Right;   VIDEO BRONCHOSCOPY N/A 11/15/2014   Procedure: VIDEO BRONCHOSCOPY;  Surgeon: [name], MD;  [facility] OR;  Service: Thoracic;  Laterality: N/A;        Home Medications    Prior to Admission medications   Medication Sig Start Date End Date Taking? Authorizing Provider  acetaminophen (TYLENOL) 500 MG tablet Take 1,000 mg by mouth every 6 (six) hours as needed for mild pain or moderate pain.    Yes [provider]  clonazePAM (KLONOPIN) 1 MG tablet Take 1 tablet by mouth three times daily as needed Patient taking differently: Take 1 mg by mouth 3 (three) times daily.  02/07/19  Yes Derek Jack, MD  Diphenhyd-Hydrocort-Nystatin (  FIRST-DUKES MOUTHWASH) SUSP Use as directed 5 mLs in the mouth or throat 4 (four) times daily. Patient taking differently: Use as directed 5 mLs in the mouth or throat 2 (two) times daily.  10/07/18  Yes Derek Jack, MD  gabapentin (NEURONTIN) 300 MG capsule TAKE 3 CAPSULES BY MOUTH TWICE DAILY Patient taking differently: Take 900 mg by mouth 2 (two) times daily.  01/27/19  Yes Derek Jack, MD  glimepiride (AMARYL) 2 MG tablet Take 2 mg by mouth 2 (two) times daily before a meal.    Yes [provider]  levofloxacin (LEVAQUIN) 500 MG tablet Take 500 mg by mouth daily. 7 day course starting on 03/10/2019 03/10/19  Yes [provider]  mirtazapine (REMERON) 7.5 MG tablet Take 7.5 mg by mouth at  bedtime.  11/10/18  Yes [provider]  ondansetron (ZOFRAN) 8 MG tablet Take 1 tablet (8 mg total) by mouth 2 (two) times daily as needed for refractory nausea / vomiting. Start on day 3 after chemotherapy. 10/01/18  Yes Emokpae, Courage, MD  pantoprazole (PROTONIX) 40 MG tablet Take 40 mg by mouth every evening.  12/13/18  Yes [provider]  Probiotic Product (PROBIOTIC DAILY PO) Take 1 capsule by mouth 2 (two) times daily.    Yes [provider]  rivaroxaban (XARELTO) 20 MG TABS tablet Take 1 tablet (20 mg total) by mouth daily with supper. 09/19/18  Yes Rehman, Mechele Dawley, MD  vitamin B-12 (CYANOCOBALAMIN) 1000 MCG tablet Take 1,000 mcg by mouth daily.   Yes [provider]  amoxicillin-clavulanate (AUGMENTIN) 875-125 MG tablet Take 1 tablet by mouth every 12 (twelve) hours. Patient not taking: Reported on 03/10/2019 02/26/19   TatShanon Brow, MD    Family History Family History  Problem Relation Age of Onset   Cancer Brother        throat   Cancer Brother        prostate   Colon cancer Neg Hx    Liver disease Neg Hx    Inflammatory bowel disease Neg Hx     Social History Social History   Tobacco Use   Smoking status: Never Smoker   Smokeless tobacco: Never Used  Substance Use Topics   Alcohol use: No   Drug use: No     Allergies   Oxycodone, Tramadol, and Trazodone and nefazodone   Review of Systems Review of Systems  Constitutional: Positive for chills. Negative for fever.  Respiratory: Negative for shortness of breath.   Cardiovascular: Negative for chest pain.  Gastrointestinal: Positive for abdominal pain, nausea and vomiting. Negative for blood in stool, constipation and diarrhea.  Genitourinary: Negative for decreased urine volume.  Skin: Negative for rash and wound.  Allergic/Immunologic: Positive for immunocompromised state.  Neurological: Negative for weakness.  Hematological: Negative for adenopathy.    Psychiatric/Behavioral: Negative for confusion.     Physical Exam Updated Vital Signs BP 113/70    Pulse 96    Temp (!) 102.3 F (39.1 C) (Rectal)    Resp 16    Ht 6\' 2"  (1.88 m)    Wt 73 kg    SpO2 98%    BMI 20.67 kg/m   Physical Exam Vitals signs and nursing note reviewed.  Constitutional:      General: He is not in acute distress.    Appearance: He is well-developed. He is not diaphoretic.  HENT:     Head: Normocephalic and atraumatic.  Cardiovascular:     Rate and Rhythm: Regular rhythm. Tachycardia present.  Heart sounds: Normal heart sounds.  Pulmonary:     Effort: Pulmonary effort is normal.     Breath sounds: Normal breath sounds.  Abdominal:     Tenderness: There is generalized abdominal tenderness.     Comments: Colostomy present  Skin:    General: Skin is warm and dry.     Findings: No rash.  Neurological:     Mental Status: He is alert and oriented to person, place, and time.  Psychiatric:        Behavior: Behavior normal.      ED Treatments / Results  Labs (all labs ordered are listed, but only abnormal results are displayed) Labs Reviewed  CBC WITH DIFFERENTIAL/PLATELET - Abnormal; Notable for the following components:      Result Value   WBC 16.2 (*)    RBC 3.91 (*)    Hemoglobin 11.0 (*)    HCT 35.7 (*)    Platelets 421 (*)    Neutro Abs 13.2 (*)    Monocytes Absolute 1.4 (*)    All other components within normal limits  COMPREHENSIVE METABOLIC PANEL - Abnormal; Notable for the following components:   Sodium 129 (*)    Chloride 95 (*)    Glucose, Bld 253 (*)    Calcium 8.4 (*)    Albumin 2.9 (*)    Total Bilirubin 1.3 (*)    All other components within normal limits  URINALYSIS, ROUTINE W REFLEX MICROSCOPIC - Abnormal; Notable for the following components:   Color, Urine AMBER (*)    APPearance HAZY (*)    Glucose, UA 50 (*)    Protein, ur 100 (*)    All other components within normal limits  LACTIC ACID, PLASMA - Abnormal;  Notable for the following components:   Lactic Acid, Venous 2.5 (*)    All other components within normal limits  CULTURE, BLOOD (ROUTINE X 2)  CULTURE, BLOOD (ROUTINE X 2)  SARS CORONAVIRUS 2 (TAT 6-24 HRS)  LIPASE, BLOOD  LACTIC ACID, PLASMA    EKG None  Radiology Ct Abdomen Pelvis W Contrast  Result Date: 03/10/2019 CLINICAL DATA:  Right lower quadrant pain history of appendicitis without surgery history of rectal cancer lung [place] EXAM: CT ABDOMEN AND PELVIS WITH CONTRAST TECHNIQUE: Multidetector CT imaging of the abdomen and pelvis was performed using the standard protocol following bolus administration of intravenous contrast. CONTRAST:  116mL OMNIPAQUE IOHEXOL 300 MG/ML  SOLN COMPARISON:  CT 02/22/2019, 09/22/2018, MRI 09/10/2018, CT 07/30/2018 FINDINGS: Lower chest: Lung bases demonstrate no acute consolidation or effusion. Stable scarring and nodularity within the posterior right lung base and within the right middle lobe. Stable 7 mm anterior right upper lobe lung nodule. Heart size is normal Hepatobiliary: No focal hepatic abnormality. No calcified gallstone. Mild wall thickening of the gallbladder fundus. Pancreas: Diffusely atrophic pancreas without inflammatory change. Hypoenhancing mass at the posterior head of the pancreas measuring 2.1 by 2 cm, grossly stable. Spleen: Normal in size without focal abnormality. Adrenals/Urinary Tract: Adrenal glands are within normal limits aside from left adrenal calcification. Kidneys show no hydronephrosis. 9 mm exophytic solid-appearing mass lower pole left kidney. Stomach/Bowel: The stomach is nonenlarged. No dilated small bowel. Left abdominal loop colostomy. Rectal anastomosis with mild rectal wall thickening, similar compared to most recent prior. Enlarged appendix measuring up to 16 mm. Persistent considerable inflammatory change about the appendix. Irregular rim enhancing fluid collection in the right lower quadrant measuring 3.9 by 3.4 by 3.8  cm consistent with periappendiceal abscess and perforated  appendix. Appendicoliths within the rim enhancing fluid collection. Vascular/Lymphatic: Moderate aortic atherosclerosis. Mild aortic atherosclerosis without aneurysm. Subcentimeter right lower quadrant lymph nodes. IVC filter tip just below the level of renal vein confluence. Protrusion of filter struts beyond the IVC luminal margin. Reproductive: Prostate calcification Other: Presacral soft tissue stranding and small fluid.  No free air Musculoskeletal: Irregular sclerotic and lucent lesion of the inferior sternum as before. No new osseous abnormality IMPRESSION: 1. Continued considerable inflammatory changes in the right lower quadrant surrounding an abnormal appendix. Interval perforation of the appendix with new irregular rim enhancing fluid collection measuring up to 3.9 cm, compatible with periappendiceal abscess. There is an appendicoliths in the abscess fluid collection. There is no extraluminal gas or free air. 2. Status post partial proctocolectomy with left lower quadrant loop colostomy. Mild residual rectal wall thickening 3. Stable diffuse atrophy of the pancreas with ductal dilatation and 2.2 cm obstructing mass at the posterior head of the pancreas 4. Stable 9 mm solid appearing exophytic lesion lower pole left kidney 5. Stable lucent and sclerotic lesion involving the lower sternum Electronically Signed   By: Donavan Foil M.D.   On: 03/10/2019 19:13    Procedures .Critical Care Performed by: Tacy Learn, PA-C Authorized by: Tacy Learn, PA-C   Critical care provider statement:    Critical care time (minutes):  45   Critical care was necessary to treat or prevent imminent or life-threatening deterioration of the following conditions:  Sepsis   Critical care was time spent personally by me on the following activities:  Discussions with consultants, evaluation of patient's response to treatment, examination of patient,  ordering and performing treatments and interventions, ordering and review of laboratory studies, ordering and review of radiographic studies, pulse oximetry, re-evaluation of patient's condition, obtaining history from patient or surrogate and review of old charts   (including critical care time)  Medications Ordered in ED Medications  sodium chloride 0.9 % bolus 1,000 mL (0 mLs Intravenous Stopped 03/10/19 1952)  ondansetron (ZOFRAN) injection 4 mg (4 mg Intravenous Given 03/10/19 1729)  HYDROmorphone (DILAUDID) injection 0.5 mg (0.5 mg Intravenous Given 03/10/19 1729)  iohexol (OMNIPAQUE) 9 MG/ML oral solution (  Contrast Given 03/10/19 1915)  piperacillin-tazobactam (ZOSYN) IVPB 3.375 g (0 g Intravenous Stopped 03/10/19 1952)  iohexol (OMNIPAQUE) 300 MG/ML solution 100 mL (100 mLs Intravenous Contrast Given 03/10/19 1745)  acetaminophen (TYLENOL) tablet 650 mg (650 mg Oral Given 03/10/19 1759)     Initial Impression / Assessment and Plan / ED Course  I have reviewed the triage vital signs and the nursing notes.  Pertinent labs & imaging results that were available during my care of the patient were reviewed by me and considered in my medical decision making (see chart for details).  Clinical Course as of Mar 09 2025  Thu Mar 10, 2019  1957 68yo male with history of metastatic rectal cancer, recently admitted October 20 through the 24th for perforated appendicitis, managed with IV antibiotics and discharged home on Augmentin.  Patient returns today with vomiting.  Patient is found to be tachycardic, febrile on arrival with mild generalized abdominal pain.  Reports normal stool output through colostomy.  CBC with elevated white count at 16.2, hemoglobin 11.0, CMP with mild hyponatremia of 129.  Initial lactic acid was elevated at 2.5, repeat at 1.2 after fluid bolus.  Patient was given IV Zosyn.  CT concerning for abscess at the perforated appendix. Case discussed with Dr. Stark Jock, ER attending, plan  to consult  general surgery.  Case discussed with Dr. Arnoldo Morale on-call with general surgery, recommends hospitalist to admit with plan for IR consult tomorrow and surgery to see patient tomorrow as well. Case discussed with hospitalist who will consult for admission.   [LM]  2026 Discussed plan of care with patient, no needs at this time.    [LM]    Clinical Course User Index [LM] Tacy Learn, PA-C      Final Clinical Impressions(s) / ED Diagnoses   Final diagnoses:  Appendicitis with abscess    ED Discharge Orders    None       Roque Lias 03/10/19 2026    Veryl Speak, MD 03/10/19 2303

## 2019-03-10 NOTE — ED Notes (Signed)
Advised patient we needed urine specimen and patient given urinal.

## 2019-03-10 NOTE — H&P (Signed)
History and Physical    Charles [name] IOX:[id number] DOB: 1951-01-04 DOA: 03/10/2019  PCP: Charles Chroman, MD  Patient coming from: Home  I have personally briefly reviewed patient's old medical records in [place]  Chief Complaint: Abdominal pain, vomiting, fever  HPI: Charles [name] is a 68 y.o. male with medical history significant of  stage IV rectal cancer with metastasis to lung and pancreas, DVT, PE, diabetes mellitus type 2, peripheral vascular disease who presented to the ER with vomiting, fever.  Review of records shows patient was admitted from 02/22/2019-02/26/2019 with acute appendicitis which was managed conservatively with IV antibiotics and IV fluids and patient was discharged home with 10 days of oral Augmentin.  Patient called Dr. Constance Haw with general surgery in the office and was sent into the ER due to recurrent and worsening symptoms.  He says he started having nausea and vomiting today and has been having ongoing abdominal pain.  Noted to have a fever spike of 102.3 on presentation. ED Course:  Vital Signs reviewed on presentation, significant for temperature 102.3, heart rate 124 on initial presentation, improved to 94, blood pressure 113/70, saturation 98% on room air. Labs reviewed, significant for sodium 129, potassium 4.2, chloride 95, BUN 15, creatinine 1.1, LFTs within normal limits, initial lactic acid 2.5, repeat of 1.2, WBC count 16.2, hemoglobin 11.0, hematocrit 35.7, MCV 91, glucose 253, urinalysis essentially unremarkable, blood cultures have been sent. Imaging personally Reviewed, CT of the abdomen pelvis shows continued considerable inflammatory changes in the right lower quadrant surrounding an abnormal appendix with interval perforation of the appendix with a new irregular rim-enhancing fluid collection measuring up to 3.9 cm compatible with periappendiceal abscess.  There is an appendicolith in the abscess fluid collection.  No extraluminal free  air.  S/p partial proctocolectomy with left lower quadrant loop colostomy.  Stable diffuse atrophy of the pancreas and ductal dilatation and 2.2 cm obstructing mass of the posterior head of the pancreas.  Stable 9 mm solid-appearing exophytic lesion in the lower pole of the left kidney.  Sclerotic lesion involving the lower sternum. Case was discussed with Dr. Arnoldo Morale, the on-call general surgeon who recommended admit to the hospitalist.  Plan will be for interventional radiology consult for drain placement and patient will need to go to [facility] for the procedure and can then return back to [facility].  General surgery will be available for consult in the morning.  Review of Systems: As per HPI otherwise 10 point review of systems negative.  All other review of systems is negative except the ones noted above in the HPI.  Past Medical History:  Diagnosis Date   Chronic anticoagulation    Colon cancer ([place]) 07/24/2010   rectal ca, inv adenocarcinoma   Depression 04/01/2011   Diabetes mellitus without complication ([place])    DVT (deep venous thrombosis) ([place]) 05/09/2011   GERD (gastroesophageal reflux disease)    High output ileostomy ([place]) 05/21/2011   History of kidney stones    HTN (hypertension)    Hx of radiation therapy 09/02/10 to 10/14/10   pelvis   Lung metastasis ([place])    Neuropathy    Peripheral vascular disease ([place])    dvt's,pe   Pneumonia    hx x3   Pulmonary embolism ([place])    Rectal cancer ([place]) 01/15/2011   S/P radiation and concurrent 5-FU continuous infusion from 09/09/10- 10/10/10.  S/P proctectomy with colorectal anastomosis and diverting loop ileostomy on 11/14/10 at [facility] by  Dr. Harlon Ditty. Pathology reveals a pT3b N1 with 3/20 lymph nodes.      Past Surgical History:  Procedure Laterality Date   BILIARY STENT PLACEMENT N/A 09/16/2018   Procedure: STENT PLACEMENT;  Surgeon: Rogene Houston, MD;  [facility];  Service:  Endoscopy;  Laterality: N/A;   BIOPSY  09/24/2018   Procedure: BIOPSY;  Surgeon: Danie Binder, MD;  [facility];  Service: Endoscopy;;   COLON SURGERY  11/14/2010   proctectomy with colorectal anastomosis and diverting loop ileostomy (temporary planned)   COLONOSCOPY  07/2010   proximal rectal apple core mass 10-14cm from anal verge (adenocarcinoma), 2-3cm distal rectal carpet polyp s/p piecemeal snare polypectomy (adenoma)   COLONOSCOPY  04/21/2012   RMR: Friable,fibrotic appearing colorectal anastomosis producing some luminal narrowing-not felt to be critical. path: focal erosion with slight inflammation and hyperemia. SURVEILLANCE DUE DEC 2015   COLONOSCOPY N/A 11/24/2013   Dr. Rourk:somewhat fibrotic/friable anastomotic mucosal-status post biopsy (narrowing not felt to be clinically significant) Single colonic diverticulum. benign polypoid rectal mucosa   colostomy reversal  april 2013   ERCP N/A 09/16/2018   Procedure: ENDOSCOPIC RETROGRADE CHOLANGIOPANCREATOGRAPHY (ERCP);  Surgeon: Rogene Houston, MD;  [facility];  Service: Endoscopy;  Laterality: N/A;   ESOPHAGOGASTRODUODENOSCOPY  07/2010   RMR: schatki ring s/p dilation, small hh, SB bx benign   ESOPHAGOGASTRODUODENOSCOPY N/A 09/04/2015   Procedure: ESOPHAGOGASTRODUODENOSCOPY (EGD);  Surgeon: Daneil Dolin, MD;  [facility];  Service: Endoscopy;  Laterality: N/A;  730   EUS  08/2010   Dr. Owens Loffler. uT3N0 circumferential, nearly obstruction rectosigmoid adenocarcinoma, distal edge 12cm from anal verge   EUS N/A 06/19/2016   Procedure: UPPER ENDOSCOPIC ULTRASOUND (EUS) RADIAL;  Surgeon: Milus Banister, MD;  [facility];  Service: Endoscopy;  Laterality: N/A;   FLEXIBLE SIGMOIDOSCOPY N/A 09/24/2018   Procedure: FLEXIBLE SIGMOIDOSCOPY;  Surgeon: Danie Binder, MD;  [facility];  Service: Endoscopy;  Laterality: N/A;   HERNIA REPAIR     abd hernia repair   IVC  filter     ivc filter     port a cath placement     PORT-A-CATH REMOVAL  09/24/2011   Procedure: REMOVAL PORT-A-CATH;  Surgeon: Donato Heinz, MD;  Location: [facility];  Service: General;  Laterality: N/A;  Minor Room   PORTACATH PLACEMENT Right 07/07/2016   Procedure: INSERTION PORT-A-CATH;  Surgeon: Aviva Signs, MD;  Location: [facility];  Service: General;  Laterality: Right;   SPHINCTEROTOMY N/A 09/16/2018   Procedure: SPHINCTEROTOMY with balloon dialation;  Surgeon: Rogene Houston, MD;  [facility];  Service: Endoscopy;  Laterality: N/A;   TRANSVERSE LOOP COLOSTOMY N/A 09/29/2018   Procedure: TRANSVERSE LOOP COLOSTOMY;  Surgeon: Aviva Signs, MD;  Location: [facility];  Service: General;  Laterality: N/A;   VIDEO ASSISTED THORACOSCOPY (VATS)/WEDGE RESECTION Right 11/15/2014   Procedure: VIDEO ASSISTED THORACOSCOPY (VATS)/LUNG RESECTION WITH RIGHT LINGULECTOMY;  Surgeon: [name], MD;  Location: [place];  Service: Thoracic;  Laterality: Right;   VIDEO BRONCHOSCOPY N/A 11/15/2014   Procedure: VIDEO BRONCHOSCOPY;  Surgeon: [name], MD;  Location: [place];  Service: Thoracic;  Laterality: N/A;     reports that he has never smoked. He has never used smokeless tobacco. He reports that he does not drink alcohol or use drugs.  Allergies  Allergen Reactions   Oxycodone     Blisters, hallucinations   Tramadol     Blisters,  hallucinations   Trazodone And Nefazodone Other (See Comments)    hallucinations    Family History  Problem Relation Age of Onset   Cancer Brother        throat   Cancer Brother        prostate   Colon cancer Neg Hx    Liver disease Neg Hx    Inflammatory bowel disease Neg Hx    Family history reviewed, noted as above, not pertinent to current presentation.   Prior to Admission medications   Medication Sig Start Date End Date Taking? Authorizing Provider  acetaminophen (TYLENOL) 500 MG tablet Take 1,000 mg by mouth every 6 (six)  hours as needed for mild pain or moderate pain.    Yes [provider]  clonazePAM (KLONOPIN) 1 MG tablet Take 1 tablet by mouth three times daily as needed Patient taking differently: Take 1 mg by mouth 3 (three) times daily.  02/07/19  Yes Derek Jack, MD  Diphenhyd-Hydrocort-Nystatin (FIRST-DUKES MOUTHWASH) SUSP Use as directed 5 mLs in the mouth or throat 4 (four) times daily. Patient taking differently: Use as directed 5 mLs in the mouth or throat 2 (two) times daily.  10/07/18  Yes Derek Jack, MD  gabapentin (NEURONTIN) 300 MG capsule TAKE 3 CAPSULES BY MOUTH TWICE DAILY Patient taking differently: Take 900 mg by mouth 2 (two) times daily.  01/27/19  Yes Derek Jack, MD  glimepiride (AMARYL) 2 MG tablet Take 2 mg by mouth 2 (two) times daily before a meal.    Yes [provider]  levofloxacin (LEVAQUIN) 500 MG tablet Take 500 mg by mouth daily. 7 day course starting on 03/10/2019 03/10/19  Yes [provider]  mirtazapine (REMERON) 7.5 MG tablet Take 7.5 mg by mouth at bedtime.  11/10/18  Yes [provider]  ondansetron (ZOFRAN) 8 MG tablet Take 1 tablet (8 mg total) by mouth 2 (two) times daily as needed for refractory nausea / vomiting. Start on day 3 after chemotherapy. 10/01/18  Yes Emokpae, Courage, MD  pantoprazole (PROTONIX) 40 MG tablet Take 40 mg by mouth every evening.  12/13/18  Yes [provider]  Probiotic Product (PROBIOTIC DAILY PO) Take 1 capsule by mouth 2 (two) times daily.    Yes [provider]  rivaroxaban (XARELTO) 20 MG TABS tablet Take 1 tablet (20 mg total) by mouth daily with supper. 09/19/18  Yes Rehman, Mechele Dawley, MD  vitamin B-12 (CYANOCOBALAMIN) 1000 MCG tablet Take 1,000 mcg by mouth daily.   Yes [provider]  amoxicillin-clavulanate (AUGMENTIN) 875-125 MG tablet Take 1 tablet by mouth every 12 (twelve) hours. Patient not taking: Reported on 03/10/2019 02/26/19   Orson Eva, MD     Physical Exam: Vitals:   03/10/19 1832 03/10/19 1900 03/10/19 1930 03/10/19 2000  BP:  109/71 104/71 113/70  Pulse: (!) 104 (!) 102 96 96  Resp:  16 17 16   Temp:      TempSrc:      SpO2: 98% 98% 97% 98%  Weight:      Height:        Constitutional: NAD, calm, comfortable Vitals:   03/10/19 1832 03/10/19 1900 03/10/19 1930 03/10/19 2000  BP:  109/71 104/71 113/70  Pulse: (!) 104 (!) 102 96 96  Resp:  16 17 16   Temp:      TempSrc:      SpO2: 98% 98% 97% 98%  Weight:      Height:       Eyes: PERRL, lids  and conjunctivae normal ENMT: Mucous membranes are moist. Posterior pharynx clear of any exudate or lesions.Normal dentition.  Neck: normal, supple, no masses, no thyromegaly Respiratory: clear to auscultation bilaterally, no wheezing, no crackles. Normal respiratory effort. No accessory muscle use.  Cardiovascular: Regular rate and rhythm, no murmurs / rubs / gallops. No extremity edema. 2+ pedal pulses. No carotid bruits.  Abdomen: Colostomy noted on left lower quadrant with loose output.  Right lower quadrant tenderness with mild guarding.  No rigidity.  Bowel sounds are diminished.  Musculoskeletal: no clubbing / cyanosis. No joint deformity upper and lower extremities. Good ROM, no contractures. Normal muscle tone.  Skin: no rashes, lesions, ulcers. No induration Neurologic: CN 2-12 grossly intact. Sensation intact, DTR normal. Strength 5/5 in all 4.  Psychiatric: Normal judgment and insight. Alert and oriented x 3. Normal mood.    Decubitus Ulcers: Not present on admission Catheters and tubes: None   Labs on Admission: I have personally reviewed following labs and imaging studies  CBC: Recent Labs  Lab 03/10/19 1559  WBC 16.2*  NEUTROABS 13.2*  HGB 11.0*  HCT 35.7*  MCV 91.3  PLT 790*   Basic Metabolic Panel: Recent Labs  Lab 03/10/19 1559  NA 129*  K 4.2  CL 95*  CO2 24  GLUCOSE 253*  BUN 15  CREATININE 1.11  CALCIUM 8.4*   GFR: Estimated  Creatinine Clearance: 65.8 mL/min (by C-G formula based on SCr of 1.11 mg/dL). Liver Function Tests: Recent Labs  Lab 03/10/19 1559  AST 17  ALT 15  ALKPHOS 72  BILITOT 1.3*  PROT 7.0  ALBUMIN 2.9*   Recent Labs  Lab 03/10/19 1559  LIPASE 16   No results for input(s): AMMONIA in the last 168 hours. Coagulation Profile: No results for input(s): INR, PROTIME in the last 168 hours. Cardiac Enzymes: No results for input(s): CKTOTAL, CKMB, CKMBINDEX, TROPONINI in the last 168 hours. BNP (last 3 results) No results for input(s): PROBNP in the last 8760 hours. HbA1C: No results for input(s): HGBA1C in the last 72 hours. CBG: No results for input(s): GLUCAP in the last 168 hours. Lipid Profile: No results for input(s): CHOL, HDL, LDLCALC, TRIG, CHOLHDL, LDLDIRECT in the last 72 hours. Thyroid Function Tests: No results for input(s): TSH, T4TOTAL, FREET4, T3FREE, THYROIDAB in the last 72 hours. Anemia Panel: No results for input(s): VITAMINB12, FOLATE, FERRITIN, TIBC, IRON, RETICCTPCT in the last 72 hours. Urine analysis:    Component Value Date/Time   COLORURINE AMBER (A) 03/10/2019 1548   APPEARANCEUR HAZY (A) 03/10/2019 1548   LABSPEC 1.024 03/10/2019 1548   PHURINE 5.0 03/10/2019 1548   GLUCOSEU 50 (A) 03/10/2019 1548   HGBUR NEGATIVE 03/10/2019 [place] 03/10/2019 1548   KETONESUR NEGATIVE 03/10/2019 1548   PROTEINUR 100 (A) 03/10/2019 1548   UROBILINOGEN 1.0 11/14/2014 1504   NITRITE NEGATIVE 03/10/2019 1548   LEUKOCYTESUR NEGATIVE 03/10/2019 1548    Radiological Exams on Admission: Ct Abdomen Pelvis W Contrast  Result Date: 03/10/2019 CLINICAL DATA:  Right lower quadrant pain history of appendicitis without surgery history of rectal cancer lung [place] EXAM: CT ABDOMEN AND PELVIS WITH CONTRAST TECHNIQUE: Multidetector CT imaging of the abdomen and pelvis was performed using the standard protocol following bolus administration of intravenous  contrast. CONTRAST:  174mL OMNIPAQUE IOHEXOL 300 MG/ML  SOLN COMPARISON:  CT 02/22/2019, 09/22/2018, MRI 09/10/2018, CT 07/30/2018 FINDINGS: Lower chest: Lung bases demonstrate no acute consolidation or effusion. Stable scarring and nodularity within the posterior right lung  base and within the right middle lobe. Stable 7 mm anterior right upper lobe lung nodule. Heart size is normal Hepatobiliary: No focal hepatic abnormality. No calcified gallstone. Mild wall thickening of the gallbladder fundus. Pancreas: Diffusely atrophic pancreas without inflammatory change. Hypoenhancing mass at the posterior head of the pancreas measuring 2.1 by 2 cm, grossly stable. Spleen: Normal in size without focal abnormality. Adrenals/Urinary Tract: Adrenal glands are within normal limits aside from left adrenal calcification. Kidneys show no hydronephrosis. 9 mm exophytic solid-appearing mass lower pole left kidney. Stomach/Bowel: The stomach is nonenlarged. No dilated small bowel. Left abdominal loop colostomy. Rectal anastomosis with mild rectal wall thickening, similar compared to most recent prior. Enlarged appendix measuring up to 16 mm. Persistent considerable inflammatory change about the appendix. Irregular rim enhancing fluid collection in the right lower quadrant measuring 3.9 by 3.4 by 3.8 cm consistent with periappendiceal abscess and perforated appendix. Appendicoliths within the rim enhancing fluid collection. Vascular/Lymphatic: Moderate aortic atherosclerosis. Mild aortic atherosclerosis without aneurysm. Subcentimeter right lower quadrant lymph nodes. IVC filter tip just below the level of renal vein confluence. Protrusion of filter struts beyond the IVC luminal margin. Reproductive: Prostate calcification Other: Presacral soft tissue stranding and small fluid.  No free air Musculoskeletal: Irregular sclerotic and lucent lesion of the inferior sternum as before. No new osseous abnormality IMPRESSION: 1. Continued  considerable inflammatory changes in the right lower quadrant surrounding an abnormal appendix. Interval perforation of the appendix with new irregular rim enhancing fluid collection measuring up to 3.9 cm, compatible with periappendiceal abscess. There is an appendicoliths in the abscess fluid collection. There is no extraluminal gas or free air. 2. Status post partial proctocolectomy with left lower quadrant loop colostomy. Mild residual rectal wall thickening 3. Stable diffuse atrophy of the pancreas with ductal dilatation and 2.2 cm obstructing mass at the posterior head of the pancreas 4. Stable 9 mm solid appearing exophytic lesion lower pole left kidney 5. Stable lucent and sclerotic lesion involving the lower sternum Electronically Signed   By: Donavan Foil M.D.   On: 03/10/2019 19:13      Assessment/Plan Principal Problem:   Appendicular abscess Active Problems:   GERD   Rectal cancer (H[facility])   Hx of radiation therapy   Acute pulmonary embolism ([facility])   DVT (deep venous thrombosis) ([facility])   S/P partial lobectomy of lung   Hiatal hernia   Type 2 diabetes mellitus ([place])     Principal Problem: Periappendiceal abscess with acute appendicitis Patient presented with worsening abdominal pain, nausea, vomiting, fever, chills.  CT of the abdomen shows appendiceal perforation with 3.9 cm fluid collection compatible with periappendiceal abscess.  Case was discussed with Dr. Arnoldo Morale from general surgery by the ED physician who recommended admission to the hospital service and also opined that patient will need interventional radiology consult and possible transfer to [facility] for IR intervention and then can be brought back to [facility]. Review of records shows patient was admitted from 02/22/2019-02/26/2019 with acute appendicitis which was managed conservatively with IV antibiotics and IV fluids and patient was discharged home with 10 days of oral Augmentin.  Plan: We will keep n.p.o. Pain  meds as needed IV fluid resuscitation We will place on IV Zosyn General surgery consult has been placed for a.m. Case was discussed with Dr. Arnoldo Morale.  Will need to follow in a.m.  Plan for IR intervention.  Other Active Problems: Stage IV rectal cancer: With metastases to lung and pancreas.  Patient is status post proctectomy  with colorectal anastomosis and diverting loop ileostomy in 2012. Subsequently received chemotherapy with FOLFOX and then also received radiation.  Had treatment with Xeloda and bevacizumab in 2019 and 2020 with progression of disease.  Had 2 cycles of FOLFIRI in June 2020.  Most recent chemotherapy infusion on 02/11/2019 with 5-FU. Follows with Dr. [name] as outpatient.  History of DVT/PE: We will place Xarelto on hold for now.  Anticipate surgical or IR intervention in a.m.  Consider starting on heparin drip if delayed.  Diabetes mellitus type 2: Last A1c 7.1 on 02/26/2019.  Relatively well controlled. Hold oral hypoglycemics We will place on sliding scale insulin coverage fingerstick monitoring  Peripheral vascular disease    DVT prophylaxis: Lovenox Code Status:  Full code Family Communication: N/A  Disposition Plan: Will require 3 to 4 days of inpatient stay with general surgery consult and interventional radiology for possible drain. Consults called: General surgery consult placed with Dr. Arnoldo Morale.  Will need to be called in the morning. Admission status: Inpatient for acute appendicitis.   Lynetta Mare MD [facility]  If 7PM-7AM, please contact night-coverage   03/10/2019, 8:26 PM

## 2019-03-10 NOTE — ED Notes (Signed)
CRITICAL VALUE ALERT  Critical Value: lactic acid 2.5  Date & Time Notied: 03/10/19 1746  Provider Notified: murphy  Orders Received/Actions taken:

## 2019-03-10 NOTE — ED Triage Notes (Signed)
Pt states that dr bridges sent him over to have a ct of his abd he was in the hospital last week for appendicitis he was given abx for it he did not have sx.

## 2019-03-11 DIAGNOSIS — K3533 Acute appendicitis with perforation and localized peritonitis, with abscess: Principal | ICD-10-CM

## 2019-03-11 LAB — CBC
HCT: 32.5 % — ABNORMAL LOW (ref 39.0–52.0)
Hemoglobin: 9.9 g/dL — ABNORMAL LOW (ref 13.0–17.0)
MCH: 27.7 pg (ref 26.0–34.0)
MCHC: 30.5 g/dL (ref 30.0–36.0)
MCV: 90.8 fL (ref 80.0–100.0)
Platelets: 360 10*3/uL (ref 150–400)
RBC: 3.58 MIL/uL — ABNORMAL LOW (ref 4.22–5.81)
RDW: 14.8 % (ref 11.5–15.5)
WBC: 16.4 10*3/uL — ABNORMAL HIGH (ref 4.0–10.5)
nRBC: 0 % (ref 0.0–0.2)

## 2019-03-11 LAB — BASIC METABOLIC PANEL
Anion gap: 10 (ref 5–15)
BUN: 10 mg/dL (ref 8–23)
CO2: 22 mmol/L (ref 22–32)
Calcium: 8.4 mg/dL — ABNORMAL LOW (ref 8.9–10.3)
Chloride: 101 mmol/L (ref 98–111)
Creatinine, Ser: 0.78 mg/dL (ref 0.61–1.24)
GFR calc Af Amer: >60 mL/min (ref >60)
GFR calc non Af Amer: >60 mL/min (ref >60)
Glucose, Bld: 124 mg/dL — ABNORMAL HIGH (ref 70–99)
Potassium: 4.1 mmol/L (ref 3.5–5.1)
Sodium: 133 mmol/L — ABNORMAL LOW (ref 135–145)

## 2019-03-11 LAB — CBG MONITORING, ED
Glucose-Capillary: 116 mg/dL — ABNORMAL HIGH (ref 70–99)
Glucose-Capillary: 113 mg/dL — ABNORMAL HIGH (ref 70–99)

## 2019-03-11 LAB — GLUCOSE, CAPILLARY
Glucose-Capillary: 118 mg/dL — ABNORMAL HIGH (ref 70–99)
Glucose-Capillary: 132 mg/dL — ABNORMAL HIGH (ref 70–99)
Glucose-Capillary: 88 mg/dL (ref 70–99)
Glucose-Capillary: 89 mg/dL (ref 70–99)

## 2019-03-11 LAB — SURGICAL PCR SCREEN
MRSA, PCR: NEGATIVE
Staphylococcus aureus: POSITIVE — AB

## 2019-03-11 LAB — SARS CORONAVIRUS 2 (TAT 6-24 HRS): SARS Coronavirus 2: NEGATIVE

## 2019-03-11 LAB — PROTIME-INR
INR: 1.5 — ABNORMAL HIGH (ref 0.8–1.2)
Prothrombin Time: 17.8 seconds — ABNORMAL HIGH (ref 11.4–15.2)

## 2019-03-11 MED ORDER — ENOXAPARIN SODIUM 40 MG/0.4ML ~~LOC~~ SOLN: 40.0000 mg | SUBCUTANEOUS | Status: DC

## 2019-03-11 MED ORDER — PIPERACILLIN-TAZOBACTAM 3.375 G IVPB
3.3750 g | Freq: Three times a day (TID) | INTRAVENOUS | Status: DC
  Administered 2019-03-11 – 2019-03-13 (×6): 3.375 g via INTRAVENOUS
  Filled 2019-03-11 (×5): qty 50

## 2019-03-11 MED ORDER — INSULIN ASPART 100 UNIT/ML ~~LOC~~ SOLN
0.0000 [IU] | SUBCUTANEOUS | Status: DC
  Administered 2019-03-12 – 2019-03-13 (×2): 2 [IU] via SUBCUTANEOUS

## 2019-03-11 MED ORDER — SODIUM CHLORIDE 0.9 % IV SOLN
INTRAVENOUS | Status: DC
  Administered 2019-03-11: 14:00:00 via INTRAVENOUS

## 2019-03-11 MED ORDER — MUPIROCIN 2 % EX OINT
1.0000 "application " | TOPICAL_OINTMENT | Freq: Two times a day (BID) | CUTANEOUS | Status: DC
  Administered 2019-03-11 – 2019-03-13 (×5): 1 via NASAL
  Filled 2019-03-11: qty 22

## 2019-03-11 NOTE — Progress Notes (Signed)
IR requested by Dr. Arnoldo Morale for possible image-guided intraabdominal abscess drain placement.  Case has been reviewed and procedure approved by Dr. Kathlene Cote. Plan for procedure in [facility] IR 03/12/2019. [facility] to be transferred to and from [facility]- arrive at 0800 for 0900 procedure. Marylin Crosby, RN aware to facilitate transport with [facility]. Patient to be NPO at midnight. Patient to be seen/consented in IR at [facility] upon arrival.  Please call IR with questions/concerns.   Bea Graff Almee Pelphrey, PA-C 03/11/2019, 4:13 PM

## 2019-03-11 NOTE — ED Notes (Signed)
Dr. Jenkins at bedside. 

## 2019-03-11 NOTE — Consult Note (Signed)
Reason for Consult: Perforated appendicitis with intra-abdominal abscess Referring Physician: Dr. Melanee Spry is an 68 y.o. male.  HPI: Patient is a 68 year old white male with a known history of metastatic colon cancer who was recently admitted for a phlegmon in the right lower quadrant secondary to appendicitis.  This was not amenable to surgical intervention or drainage.  The patient was placed on antibiotics.  He finished his antibiotics 3 days ago.  He started having fevers at home yesterday evening and presented back to the emergency room.  CT scan of the abdomen reveals now a 3 cm intra-abdominal abscess in the area of the appendix with appendicoliths present.  Patient states his pain is 3 out of 10 when it is palpated.  Past Medical History:  Diagnosis Date  . Chronic anticoagulation   . Colon cancer ([place]) 07/24/2010   rectal ca, inv adenocarcinoma  . Depression 04/01/2011  . Diabetes mellitus without complication ([place])   . DVT (deep venous thrombosis) ([place]) 05/09/2011  . GERD (gastroesophageal reflux disease)   . High output ileostomy ([place]) 05/21/2011  . History of kidney stones   . HTN (hypertension)   . Hx of radiation therapy 09/02/10 to 10/14/10   pelvis  . Lung metastasis ([place])   . Neuropathy   . Peripheral vascular disease ([place])    dvt's,pe  . Pneumonia    hx x3  . Pulmonary embolism ([place])   . Rectal cancer ([place]) 01/15/2011   S/P radiation and concurrent 5-FU continuous infusion from 09/09/10- 10/10/10.  S/P proctectomy with colorectal anastomosis and diverting loop ileostomy on 11/14/10 at [facility] by Dr. Harlon Ditty. Pathology reveals a pT3b N1 with 3/20 lymph nodes.      Past Surgical History:  Procedure Laterality Date  . BILIARY STENT PLACEMENT N/A 09/16/2018   Procedure: STENT PLACEMENT;  Surgeon: Rogene Houston, MD;  [facility];  Service: Endoscopy;  Laterality: N/A;  . BIOPSY  09/24/2018   Procedure: BIOPSY;  Surgeon: Danie Binder, MD;   [facility];  Service: Endoscopy;;  . COLON SURGERY  11/14/2010   proctectomy with colorectal anastomosis and diverting loop ileostomy (temporary planned)  . COLONOSCOPY  07/2010   proximal rectal apple core mass 10-14cm from anal verge (adenocarcinoma), 2-3cm distal rectal carpet polyp s/p piecemeal snare polypectomy (adenoma)  . COLONOSCOPY  04/21/2012   RMR: Friable,fibrotic appearing colorectal anastomosis producing some luminal narrowing-not felt to be critical. path: focal erosion with slight inflammation and hyperemia. SURVEILLANCE DUE DEC 2015  . COLONOSCOPY N/A 11/24/2013   Dr. Rourk:somewhat fibrotic/friable anastomotic mucosal-status post biopsy (narrowing not felt to be clinically significant) Single colonic diverticulum. benign polypoid rectal mucosa  . colostomy reversal  april 2013  . ERCP N/A 09/16/2018   Procedure: ENDOSCOPIC RETROGRADE CHOLANGIOPANCREATOGRAPHY (ERCP);  Surgeon: Rogene Houston, MD;  [facility];  Service: Endoscopy;  Laterality: N/A;  . ESOPHAGOGASTRODUODENOSCOPY  07/2010   RMR: schatki ring s/p dilation, small hh, SB bx benign  . ESOPHAGOGASTRODUODENOSCOPY N/A 09/04/2015   Procedure: ESOPHAGOGASTRODUODENOSCOPY (EGD);  Surgeon: Daneil Dolin, MD;  [facility];  Service: Endoscopy;  Laterality: N/A;  730  . EUS  08/2010   Dr. Owens Loffler. uT3N0 circumferential, nearly obstruction rectosigmoid adenocarcinoma, distal edge 12cm from anal verge  . EUS N/A 06/19/2016   Procedure: UPPER ENDOSCOPIC ULTRASOUND (EUS) RADIAL;  Surgeon: Milus Banister, MD;  [facility];  Service: Endoscopy;  Laterality: N/A;  . FLEXIBLE SIGMOIDOSCOPY N/A 09/24/2018  Procedure: FLEXIBLE SIGMOIDOSCOPY;  Surgeon: Danie Binder, MD;  [facility];  Service: Endoscopy;  Laterality: N/A;  . HERNIA REPAIR     abd hernia repair  . IVC filter    . ivc filter    . port a cath placement    . PORT-A-CATH REMOVAL  09/24/2011   Procedure:  REMOVAL PORT-A-CATH;  Surgeon: Donato Heinz, MD;  Location: [facility] OR[facility];  Service: General;  Laterality: N/A;  Minor Room  . PORTACATH PLACEMENT Right 07/07/2016   Procedure: INSERTION PORT-A-CATH;  Surgeon: Aviva Signs, MD;  Location: [facility];  Service: General;  Laterality: Right;  . SPHINCTEROTOMY N/A 09/16/2018   Procedure: SPHINCTEROTOMY with balloon dialation;  Surgeon: Rogene Houston, MD;  [facility];  Service: Endoscopy;  Laterality: N/A;  . TRANSVERSE LOOP COLOSTOMY N/A 09/29/2018   Procedure: TRANSVERSE LOOP COLOSTOMY;  Surgeon: Aviva Signs, MD;  Location: [facility];  Service: General;  Laterality: N/A;  . VIDEO ASSISTED THORACOSCOPY (VATS)/WEDGE RESECTION Right 11/15/2014   Procedure: VIDEO ASSISTED THORACOSCOPY (VATS)/LUNG RESECTION WITH RIGHT LINGULECTOMY;  Surgeon: [name], MD;  Location: [facility];  Service: Thoracic;  Laterality: Right;  Marland Kitchen VIDEO BRONCHOSCOPY N/A 11/15/2014   Procedure: VIDEO BRONCHOSCOPY;  Surgeon: [name], MD;  Location: [facility] OR;  Service: Thoracic;  Laterality: N/A;    Family History  Problem Relation Age of Onset  . Cancer Brother        throat  . Cancer Brother        prostate  . Colon cancer Neg Hx   . Liver disease Neg Hx   . Inflammatory bowel disease Neg Hx     Social History:  reports that he has never smoked. He has never used smokeless tobacco. He reports that he does not drink alcohol or use drugs.  Allergies:  Allergies  Allergen Reactions  . Oxycodone     Blisters, hallucinations  . Tramadol     Blisters, hallucinations  . Trazodone And Nefazodone Other (See Comments)    hallucinations    Medications: I have reviewed the patient's current medications.  Results for orders placed or performed during the hospital encounter of 03/10/19 (from the past 48 hour(s))  Urinalysis, Routine w reflex microscopic     Status: Abnormal   Collection Time: 03/10/19  3:48 PM  Result Value Ref Range   Color, Urine AMBER (A)  YELLOW    Comment: BIOCHEMICALS MAY BE AFFECTED BY COLOR   APPearance HAZY (A) CLEAR   Specific Gravity, Urine 1.024 1.005 - 1.030   pH 5.0 5.0 - 8.0   Glucose, UA 50 (A) NEGATIVE mg/dL   Hgb urine dipstick NEGATIVE NEGATIVE   Bilirubin Urine NEGATIVE NEGATIVE   Ketones, ur NEGATIVE NEGATIVE mg/dL   Protein, ur 100 (A) NEGATIVE mg/dL   Nitrite NEGATIVE NEGATIVE   Leukocytes,Ua NEGATIVE NEGATIVE   RBC / HPF 0-5 0 - 5 RBC/hpf   WBC, UA 0-5 0 - 5 WBC/hpf   Bacteria, UA NONE SEEN NONE SEEN   Squamous Epithelial / LPF 0-5 0 - 5   Mucus PRESENT    Hyaline Casts, UA PRESENT     Comment: Performed at [facility], [address], [place], [place] [address]  CBC with Differential     Status: Abnormal   Collection Time: 03/10/19  3:59 PM  Result Value Ref Range   WBC 16.2 (H) 4.0 - 10.5 K/uL   RBC 3.91 (L) 4.22 - 5.81 MIL/uL   Hemoglobin 11.0 (  L) 13.0 - 17.0 g/dL   HCT 35.7 (L) 39.0 - 52.0 %   MCV 91.3 80.0 - 100.0 fL   MCH 28.1 26.0 - 34.0 pg   MCHC 30.8 30.0 - 36.0 g/dL   RDW 14.8 11.5 - 15.5 %   Platelets 421 (H) 150 - 400 K/uL   nRBC 0.0 0.0 - 0.2 %   Neutrophils Relative % 82 %   Neutro Abs 13.2 (H) 1.7 - 7.7 K/uL   Lymphocytes Relative 9 %   Lymphs Abs 1.4 0.7 - 4.0 K/uL   Monocytes Relative 9 %   Monocytes Absolute 1.4 (H) 0.1 - 1.0 K/uL   Eosinophils Relative 0 %   Eosinophils Absolute 0.0 0.0 - 0.5 K/uL   Basophils Relative 0 %   Basophils Absolute 0.1 0.0 - 0.1 K/uL   Immature Granulocytes 0 %   Abs Immature Granulocytes 0.07 0.00 - 0.07 K/uL    Comment: Performed at [facility], [address] [place], [place] [address]  Comprehensive metabolic panel     Status: Abnormal   Collection Time: 03/10/19  3:59 PM  Result Value Ref Range   Sodium 129 (L) 135 - 145 mmol/L   Potassium 4.2 3.5 - 5.1 mmol/L   Chloride 95 (L) 98 - 111 mmol/L   CO2 24 22 - 32 mmol/L   Glucose, Bld 253 (H) 70 - 99 mg/dL   BUN 15 8 - 23 mg/dL   Creatinine, Ser 1.11 0.61 - 1.24 mg/dL   Calcium  8.4 (L) 8.9 - 10.3 mg/dL   Total Protein 7.0 6.5 - 8.1 g/dL   Albumin 2.9 (L) 3.5 - 5.0 g/dL   AST 17 15 - 41 U/L   ALT 15 0 - 44 U/L   Alkaline Phosphatase 72 38 - 126 U/L   Total Bilirubin 1.3 (H) 0.3 - 1.2 mg/dL   GFR calc non Af Amer >60 >60 mL/min   GFR calc Af Amer >60 >60 mL/min   Anion gap 10 5 - 15    Comment: Performed at [facility], [address], [place], [place] [address]  Lipase, blood     Status: None   Collection Time: 03/10/19  3:59 PM  Result Value Ref Range   Lipase 16 11 - 51 U/L    Comment: Performed at [facility], [address] [place], [place] [address]  Lactic acid, plasma     Status: Abnormal   Collection Time: 03/10/19  3:59 PM  Result Value Ref Range   Lactic Acid, Venous 2.5 (HH) 0.5 - 1.9 mmol/L    Comment: CRITICAL RESULT CALLED TO, READ BACK BY AND VERIFIED WITH: GENTRY,R ON 03/10/19 AT 1745 BY LOY,C Performed at [facility], [address] [place], [place] [address]   Culture, blood (routine x 2)     Status: None (Preliminary result)   Collection Time: 03/10/19  5:17 PM   Specimen: Right Antecubital; Blood  Result Value Ref Range   Specimen Description RIGHT ANTECUBITAL    Special Requests      BOTTLES DRAWN AEROBIC ONLY Blood Culture adequate volume   Culture      NO GROWTH < 24 HOURS Performed at [facility], [address] [place] [address], [place], [place] [address]    Report Status PENDING   Culture, blood (routine x 2)     Status: None (Preliminary result)   Collection Time: 03/10/19  5:18 PM   Specimen: Left Antecubital; Blood  Result Value Ref Range   Specimen Description LEFT ANTECUBITAL  Special Requests      BOTTLES DRAWN AEROBIC AND ANAEROBIC Blood Culture adequate volume   Culture      NO GROWTH < 24 HOURS Performed at [facility], [address], [place], [place] [address]    Report Status PENDING   Lactic acid, plasma     Status: None   Collection Time: 03/10/19  6:53 PM  Result Value Ref Range   Lactic Acid, Venous 1.2 0.5 - 1.9  mmol/L    Comment: Performed at [facility], [address], [place], [place] [address]  CBG monitoring, ED     Status: Abnormal   Collection Time: 03/11/19  4:02 AM  Result Value Ref Range   Glucose-Capillary 116 (H) 70 - 99 mg/dL  Basic metabolic panel     Status: Abnormal   Collection Time: 03/11/19  5:31 AM  Result Value Ref Range   Sodium 133 (L) 135 - 145 mmol/L   Potassium 4.1 3.5 - 5.1 mmol/L   Chloride 101 98 - 111 mmol/L   CO2 22 22 - 32 mmol/L   Glucose, Bld 124 (H) 70 - 99 mg/dL   BUN 10 8 - 23 mg/dL   Creatinine, Ser 0.78 0.61 - 1.24 mg/dL   Calcium 8.4 (L) 8.9 - 10.3 mg/dL   GFR calc non Af Amer >60 >60 mL/min   GFR calc Af Amer >60 >60 mL/min   Anion gap 10 5 - 15    Comment: Performed at [facility], [address], [place], [place] [address]  CBC     Status: Abnormal   Collection Time: 03/11/19  5:31 AM  Result Value Ref Range   WBC 16.4 (H) 4.0 - 10.5 K/uL   RBC 3.58 (L) 4.22 - 5.81 MIL/uL   Hemoglobin 9.9 (L) 13.0 - 17.0 g/dL   HCT 32.5 (L) 39.0 - 52.0 %   MCV 90.8 80.0 - 100.0 fL   MCH 27.7 26.0 - 34.0 pg   MCHC 30.5 30.0 - 36.0 g/dL   RDW 14.8 11.5 - 15.5 %   Platelets 360 150 - 400 K/uL   nRBC 0.0 0.0 - 0.2 %    Comment: Performed at [facility], [address] [place], [place] [address]  CBG monitoring, ED     Status: Abnormal   Collection Time: 03/11/19  7:34 AM  Result Value Ref Range   Glucose-Capillary 113 (H) 70 - 99 mg/dL   *Note: Due to a large number of results and/or encounters for the requested time period, some results have not been displayed. A complete set of results can be found in Results Review.    Ct Abdomen Pelvis W Contrast  Result Date: 03/10/2019 CLINICAL DATA:  Right lower quadrant pain history of appendicitis without surgery history of rectal cancer lung [place] EXAM: CT ABDOMEN AND PELVIS WITH CONTRAST TECHNIQUE: Multidetector CT imaging of the abdomen and pelvis was performed using the standard protocol following bolus  administration of intravenous contrast. CONTRAST:  146mL OMNIPAQUE IOHEXOL 300 MG/ML  SOLN COMPARISON:  CT 02/22/2019, 09/22/2018, MRI 09/10/2018, CT 07/30/2018 FINDINGS: Lower chest: Lung bases demonstrate no acute consolidation or effusion. Stable scarring and nodularity within the posterior right lung base and within the right middle lobe. Stable 7 mm anterior right upper lobe lung nodule. Heart size is normal Hepatobiliary: No focal hepatic abnormality. No calcified gallstone. Mild wall thickening of the gallbladder fundus. Pancreas: Diffusely atrophic pancreas without inflammatory change. Hypoenhancing mass at the posterior head of the pancreas measuring 2.1 by 2  cm, grossly stable. Spleen: Normal in size without focal abnormality. Adrenals/Urinary Tract: Adrenal glands are within normal limits aside from left adrenal calcification. Kidneys show no hydronephrosis. 9 mm exophytic solid-appearing mass lower pole left kidney. Stomach/Bowel: The stomach is nonenlarged. No dilated small bowel. Left abdominal loop colostomy. Rectal anastomosis with mild rectal wall thickening, similar compared to most recent prior. Enlarged appendix measuring up to 16 mm. Persistent considerable inflammatory change about the appendix. Irregular rim enhancing fluid collection in the right lower quadrant measuring 3.9 by 3.4 by 3.8 cm consistent with periappendiceal abscess and perforated appendix. Appendicoliths within the rim enhancing fluid collection. Vascular/Lymphatic: Moderate aortic atherosclerosis. Mild aortic atherosclerosis without aneurysm. Subcentimeter right lower quadrant lymph nodes. IVC filter tip just below the level of renal vein confluence. Protrusion of filter struts beyond the IVC luminal margin. Reproductive: Prostate calcification Other: Presacral soft tissue stranding and small fluid.  No free air Musculoskeletal: Irregular sclerotic and lucent lesion of the inferior sternum as before. No new osseous  abnormality IMPRESSION: 1. Continued considerable inflammatory changes in the right lower quadrant surrounding an abnormal appendix. Interval perforation of the appendix with new irregular rim enhancing fluid collection measuring up to 3.9 cm, compatible with periappendiceal abscess. There is an appendicoliths in the abscess fluid collection. There is no extraluminal gas or free air. 2. Status post partial proctocolectomy with left lower quadrant loop colostomy. Mild residual rectal wall thickening 3. Stable diffuse atrophy of the pancreas with ductal dilatation and 2.2 cm obstructing mass at the posterior head of the pancreas 4. Stable 9 mm solid appearing exophytic lesion lower pole left kidney 5. Stable lucent and sclerotic lesion involving the lower sternum Electronically Signed   By: Donavan Foil M.D.   On: 03/10/2019 19:13    ROS:  Pertinent items are noted in HPI.  Blood pressure 124/76, pulse (!) 110, temperature (!) 100.5 F (38.1 C), temperature source Rectal, resp. rate 15, height 6\' 2"  (1.88 m), weight 73 kg, SpO2 98 %. Physical Exam: Pleasant white male no acute distress Head is normocephalic, atraumatic Lungs clear to auscultation with good breath sounds bilaterally Heart examination reveals a regular rate and rhythm without S3, S4, murmurs Abdomen is soft with tenderness in the right lower quadrant to palpation.  A colostomy is present in the left upper quadrant.  No rigidity is noted.  CT scan images personally reviewed  Assessment/Plan: Impression: Perforated appendicitis with intra-abdominal abscess that has now formed.  Xarelto anticoagulation Plan: We will need percutaneous drainage of the intra-abdominal abscess once the Xarelto has worn off.  This will be determined by interventional radiology.  Admit to the hospital for IV antibiotics and hydration.  Aviva Signs 03/11/2019, 8:11 AM

## 2019-03-11 NOTE — ED Notes (Signed)
Dr. Shah at bedside.

## 2019-03-11 NOTE — TOC Initial Note (Signed)
Transition of Care [facility]) - Initial/Assessment Note    Patient Details  Name: Charles [name] MRN: [id number] Date of Birth: 1951-03-21  Transition of Care [facility]) CM/SW Contact:    Ihor Gully, LCSW Phone Number: 03/11/2019, 1:59 PM  Clinical Narrative:                 Patient from home with spouse. Admitted for appendicular abscess. Patient has history significant of stage IV rectal cancer with metastasis to lung and pancreas, DVT, PE, diabetes mellitus type 2, peripheral vascular disease who presented to the ER with vomiting, fever.  Patient is not active with HH. Previously used [facility].  Patient is independent at baseline. Over the past two weeks he became weaker and started ambulating by holding on to furniture. Patient has no DME in the home.  TOC following for needs.  Expected Discharge Plan: Home/Self Care Barriers to Discharge: Continued Medical Work up   Patient Goals and CMS Choice Patient states their goals for this hospitalization and ongoing recovery are:: return home      Expected Discharge Plan and Services Expected Discharge Plan: Home/Self Care       Living arrangements for the past 2 months: Single Family Home                                      Prior Living Arrangements/Services Living arrangements for the past 2 months: Single Family Home Lives with:: Spouse Patient language and need for interpreter reviewed:: Yes Do you feel safe going back to the place where you live?: Yes      Need for Family Participation in Patient Care: Yes (Comment) Care giver support system in place?: Yes (comment)   Criminal Activity/Legal Involvement Pertinent to Current Situation/Hospitalization: No - Comment as needed  Activities of Daily Living Home Assistive Devices/Equipment: CBG Meter, Blood pressure cuff, Shower chair without back ADL Screening (condition at time of admission) Patient's cognitive ability adequate to safely complete daily activities?:  Yes Is the patient deaf or have difficulty hearing?: No Does the patient have difficulty seeing, even when wearing glasses/contacts?: No Does the patient have difficulty concentrating, remembering, or making decisions?: No Patient able to express need for assistance with ADLs?: Yes Does the patient have difficulty dressing or bathing?: No Independently performs ADLs?: Yes (appropriate for developmental age) Does the patient have difficulty walking or climbing stairs?: No Weakness of Legs: None Weakness of Arms/Hands: None  Permission Sought/Granted Permission sought to share information with : Family Supports          Permission granted to share info w Relationship: Spouse, Mrs. Minder     Emotional Assessment Appearance:: Appears stated age   Affect (typically observed): Appropriate Orientation: : Oriented to Self, Oriented to Place, Oriented to  Time, Oriented to Situation Alcohol / Substance Use: Not Applicable Psych Involvement: No (comment)  Admission diagnosis:  Appendicitis with abscess [K35.33] Intra-abdominal abscess ([place]) [K65.1] Patient Active Problem List   Diagnosis Date Noted  . Appendicular abscess 03/10/2019  . Appendicitis 02/23/2019  . Sepsis due to undetermined organism ([place]) 02/23/2019  . Nausea, vomiting and diarrhea   . SIRS (systemic inflammatory response syndrome) ([place]) 10/16/2018  . Metastatic colorectal cancer ([place])   . Anastomotic stricture of colorectal region   . Abnormal CT scan, colon   . Diarrhea in adult patient 09/22/2018  . History of pulmonary embolism 09/22/2018  . Hypokalemia 09/22/2018  .  Type 2 diabetes mellitus ([place]) 09/22/2018  . Hypomagnesemia 09/22/2018  . Dilated gallbladder 09/22/2018  . Goals of care, counseling/discussion 09/20/2018  . Fecal incontinence 09/23/2017  . Community acquired pneumonia of right lower lobe of lung 07/23/2016  . Abnormal pancreas function test   . Rectal pain 05/30/2016  . Hiatal hernia   .  Schatzki's ring   . GI bleed 11/23/2014  . Gastritis 11/23/2014  . S/P thoracotomy 11/23/2014  . S/P partial lobectomy of lung 11/15/2014  . DVT (deep venous thrombosis) ([place])   . Lung nodule   . Lung nodule, solitary   . Acute pulmonary embolism ([place]) 09/08/2014  . Lactic acidosis 09/08/2014  . Lower abdominal pain 09/08/2014  . Urinary retention 09/08/2014  . Diarrhea 08/01/2014  . 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 Stiffness of joint, not elsewhere classified, ankle and foot 01/31/2014  . Weakness of both legs 01/31/2014  . Hx of radiation therapy   . Dehydration 03/10/2011  . Rectal cancer ([place]) 01/15/2011  . Colon cancer ([place]) 07/24/2010  . GERD 07/11/2010   PCP:  [name], MD Pharmacy:   [facility] [address], [place] [address] Phone: [phone] Fax: [place], [place] [address] Phone: [phone] Fax: [phone]  [place], [place] [address] [place] [address] Phone: [phone] Fax: [phone]  [place], [place] - [place] [address] Phone: [phone] Fax: [phone]     Social Determinants of Health (SDOH) Interventions    Readmission Risk Interventions Readmission Risk Prevention Plan 02/25/2019 02/24/2019 09/24/2018  Transportation Screening Complete Complete Complete  HRI or Home Care Consult - Not Complete Complete  HRI or Home Care Consult comments - declined -  Social Work Consult for [place] Planning/Counseling - Complete Complete  Palliative Care Screening - Not Applicable Not Applicable  Medication Review Press photographer) - Complete Complete  Some recent data might be hidden

## 2019-03-11 NOTE — Progress Notes (Signed)
PROGRESS NOTE    Charles [name]  IDP:[id number] DOB: 04/23/1951 DOA: 03/10/2019 PCP: [name], MD   Brief Narrative:  Per HPI: Charles [name] is a 68 y.o. male with medical history significant of stage IV rectal cancer with metastasis to lung and pancreas, DVT, PE, diabetes mellitus type 2, peripheral vascular disease who presented to the ER with vomiting, fever.  Review of records shows patient was admitted from 02/22/2019-02/26/2019 with acute appendicitis which was managed conservatively with IV antibiotics and IV fluids and patient was discharged home with 10 days of oral Augmentin.  Patient called Dr. Constance Haw with general surgery in the office and was sent into the ER due to recurrent and worsening symptoms.  He says he started having nausea and vomiting today and has been having ongoing abdominal pain.  Noted to have a fever spike of 102.3 on presentation.  11/6: Patient was admitted with periappendiceal abscess.  General surgery is following with plans to keep patient on IV fluid as well as Zosyn.  Plans for IR intervention for drainage as well.  This is currently pending.  Assessment & Plan:   Principal Problem:   Appendicular abscess Active Problems:   GERD   Rectal cancer ([place])   Hx of radiation therapy   Acute pulmonary embolism (HCC)   DVT (deep venous thrombosis) (HCC)   S/P partial lobectomy of lung   Hiatal hernia   Type 2 diabetes mellitus (HCC)   Periappendiceal abscess in the setting of acute appendicitis -Appreciate general surgery evaluation and management -Appreciate IR for drainage -Keep n.p.o. -Pain medications as needed -IV fluid resuscitation along with IV Zosyn for empiric coverage  Hyponatremia-improving -We will maintain on normal saline and recheck labs in a.m.  Stage IV rectal cancer with metastatic disease -Follows Dr. [name] outpatient  History of DVT/PE -Holding Xarelto for now with anticipated IR intervention soon  Type 2  diabetes -Hemoglobin A1c 7.1% on 02/26/2019 -Hold oral hypoglycemics -Continue SSI  PVD -Continue gabapentin for associated neuropathy  Anemia -Likely related to his stage IV rectal cancer -No signs of overt bleeding -Monitor in a.m.  DVT prophylaxis: Lovenox, hold Xarelto Code Status: Full code Family Communication: Wife at bedside Disposition Plan: Plan for IR to drain periappendiceal abscess.  Continue on IV fluid and IV Zosyn.   Consultants:   Dr. Arnoldo Morale general surgery  Procedures:   None  Antimicrobials:  Anti-infectives (From admission, onward)   Start     Dose/Rate Route Frequency Ordered Stop   03/11/19 1400  piperacillin-tazobactam (ZOSYN) IVPB 3.375 g     3.375 g 12.5 mL/hr over 240 Minutes Intravenous Every 8 hours 03/11/19 1143     03/11/19 0000  piperacillin-tazobactam (ZOSYN) IVPB 3.375 g  Status:  Discontinued     3.375 g 100 mL/hr over 30 Minutes Intravenous Every 6 hours 03/10/19 2311 03/11/19 1143   03/10/19 1730  piperacillin-tazobactam (ZOSYN) IVPB 3.375 g     3.375 g 100 mL/hr over 30 Minutes Intravenous  Once 03/10/19 1723 03/10/19 1952       Subjective: Patient seen and evaluated today with no new acute complaints or concerns. No acute concerns or events noted overnight.  He is wanting to have something to eat if possible.  Objective: Vitals:   03/11/19 1006 03/11/19 1030 03/11/19 1100 03/11/19 1206  BP: 117/73 122/76 124/76 128/82  Pulse: (!) 108 (!) 112 (!) 113 (!) 114  Resp: 12 11 10 16   Temp:      TempSrc:  SpO2: 98% 98% 99% 100%  Weight:      Height:        Intake/Output Summary (Last 24 hours) at 03/11/2019 1319 Last data filed at 03/11/2019 0624 Gross per 24 hour  Intake -  Output 1400 ml  Net -1400 ml   Filed Weights   03/10/19 1500  Weight: 73 kg    Examination:  General exam: Appears calm and comfortable  Respiratory system: Clear to auscultation. Respiratory effort normal. Cardiovascular system: S1 & S2  heard, RRR. No JVD, murmurs, rubs, gallops or clicks. No pedal edema. Gastrointestinal system: Abdomen is nondistended, soft and tender to palpation particularly over right lower quadrant.  Colostomy bag present with liquid brown stool.  No organomegaly or masses felt. Normal bowel sounds heard. Central nervous system: Alert and oriented. No focal neurological deficits. Extremities: Symmetric 5 x 5 power. Skin: No rashes, lesions or ulcers Psychiatry: Judgement and insight appear normal. Mood & affect appropriate.     Data Reviewed: I have personally reviewed following labs and imaging studies  CBC: Recent Labs  Lab 03/10/19 1559 03/11/19 0531  WBC 16.2* 16.4*  NEUTROABS 13.2*  --   HGB 11.0* 9.9*  HCT 35.7* 32.5*  MCV 91.3 90.8  PLT 421* 254   Basic Metabolic Panel: Recent Labs  Lab 03/10/19 1559 03/11/19 0531  NA 129* 133*  K 4.2 4.1  CL 95* 101  CO2 24 22  GLUCOSE 253* 124*  BUN 15 10  CREATININE 1.11 0.78  CALCIUM 8.4* 8.4*   GFR: Estimated Creatinine Clearance: 91.3 mL/min (by C-G formula based on SCr of 0.78 mg/dL). Liver Function Tests: Recent Labs  Lab 03/10/19 1559  AST 17  ALT 15  ALKPHOS 72  BILITOT 1.3*  PROT 7.0  ALBUMIN 2.9*   Recent Labs  Lab 03/10/19 1559  LIPASE 16   No results for input(s): AMMONIA in the last 168 hours. Coagulation Profile: Recent Labs  Lab 03/11/19 0835  INR 1.5*   Cardiac Enzymes: No results for input(s): CKTOTAL, CKMB, CKMBINDEX, TROPONINI in the last 168 hours. BNP (last 3 results) No results for input(s): PROBNP in the last 8760 hours. HbA1C: No results for input(s): HGBA1C in the last 72 hours. CBG: Recent Labs  Lab 03/11/19 0402 03/11/19 0734 03/11/19 1245  GLUCAP 116* 113* 89   Lipid Profile: No results for input(s): CHOL, HDL, LDLCALC, TRIG, CHOLHDL, LDLDIRECT in the last 72 hours. Thyroid Function Tests: No results for input(s): TSH, T4TOTAL, FREET4, T3FREE, THYROIDAB in the last 72 hours.  Anemia Panel: No results for input(s): VITAMINB12, FOLATE, FERRITIN, TIBC, IRON, RETICCTPCT in the last 72 hours. Sepsis Labs: Recent Labs  Lab 03/10/19 1559 03/10/19 1853  LATICACIDVEN 2.5* 1.2    Recent Results (from the past 240 hour(s))  Culture, blood (routine x 2)     Status: None (Preliminary result)   Collection Time: 03/10/19  5:17 PM   Specimen: Right Antecubital; Blood  Result Value Ref Range Status   Specimen Description RIGHT ANTECUBITAL  Final   Special Requests   Final    BOTTLES DRAWN AEROBIC ONLY Blood Culture adequate volume   Culture   Final    NO GROWTH < 24 HOURS Performed at [facility], [address] [place], [place] [address]    Report Status PENDING  Incomplete  Culture, blood (routine x 2)     Status: None (Preliminary result)   Collection Time: 03/10/19  5:18 PM   Specimen: Left Antecubital; Blood  Result Value Ref Range  Status   Specimen Description LEFT ANTECUBITAL  Final   Special Requests   Final    BOTTLES DRAWN AEROBIC AND ANAEROBIC Blood Culture adequate volume   Culture   Final    NO GROWTH < 24 HOURS Performed at [facility], [address] [place], [place] [address]    Report Status PENDING  Incomplete         Radiology Studies: Ct Abdomen Pelvis W Contrast  Result Date: 03/10/2019 CLINICAL DATA:  Right lower quadrant pain history of appendicitis without surgery history of rectal cancer lung [place] EXAM: CT ABDOMEN AND PELVIS WITH CONTRAST TECHNIQUE: Multidetector CT imaging of the abdomen and pelvis was performed using the standard protocol following bolus administration of intravenous contrast. CONTRAST:  152mL OMNIPAQUE IOHEXOL 300 MG/ML  SOLN COMPARISON:  CT 02/22/2019, 09/22/2018, MRI 09/10/2018, CT 07/30/2018 FINDINGS: Lower chest: Lung bases demonstrate no acute consolidation or effusion. Stable scarring and nodularity within the posterior right lung base and within the right middle lobe. Stable 7 mm anterior right upper lobe  lung nodule. Heart size is normal Hepatobiliary: No focal hepatic abnormality. No calcified gallstone. Mild wall thickening of the gallbladder fundus. Pancreas: Diffusely atrophic pancreas without inflammatory change. Hypoenhancing mass at the posterior head of the pancreas measuring 2.1 by 2 cm, grossly stable. Spleen: Normal in size without focal abnormality. Adrenals/Urinary Tract: Adrenal glands are within normal limits aside from left adrenal calcification. Kidneys show no hydronephrosis. 9 mm exophytic solid-appearing mass lower pole left kidney. Stomach/Bowel: The stomach is nonenlarged. No dilated small bowel. Left abdominal loop colostomy. Rectal anastomosis with mild rectal wall thickening, similar compared to most recent prior. Enlarged appendix measuring up to 16 mm. Persistent considerable inflammatory change about the appendix. Irregular rim enhancing fluid collection in the right lower quadrant measuring 3.9 by 3.4 by 3.8 cm consistent with periappendiceal abscess and perforated appendix. Appendicoliths within the rim enhancing fluid collection. Vascular/Lymphatic: Moderate aortic atherosclerosis. Mild aortic atherosclerosis without aneurysm. Subcentimeter right lower quadrant lymph nodes. IVC filter tip just below the level of renal vein confluence. Protrusion of filter struts beyond the IVC luminal margin. Reproductive: Prostate calcification Other: Presacral soft tissue stranding and small fluid.  No free air Musculoskeletal: Irregular sclerotic and lucent lesion of the inferior sternum as before. No new osseous abnormality IMPRESSION: 1. Continued considerable inflammatory changes in the right lower quadrant surrounding an abnormal appendix. Interval perforation of the appendix with new irregular rim enhancing fluid collection measuring up to 3.9 cm, compatible with periappendiceal abscess. There is an appendicoliths in the abscess fluid collection. There is no extraluminal gas or free air. 2.  Status post partial proctocolectomy with left lower quadrant loop colostomy. Mild residual rectal wall thickening 3. Stable diffuse atrophy of the pancreas with ductal dilatation and 2.2 cm obstructing mass at the posterior head of the pancreas 4. Stable 9 mm solid appearing exophytic lesion lower pole left kidney 5. Stable lucent and sclerotic lesion involving the lower sternum Electronically Signed   By: Donavan Foil M.D.   On: 03/10/2019 19:13        Scheduled Meds: . [START ON 03/12/2019] enoxaparin (LOVENOX) injection  40 mg Subcutaneous Q24H  . gabapentin  600 mg Oral BID  . insulin aspart  0-8 Units Subcutaneous Q4H  . mirtazapine  7.5 mg Oral QHS  . mupirocin ointment  1 application Nasal BID  . pantoprazole (PROTONIX) IV  40 mg Intravenous Q24H  . sodium chloride flush  3 mL Intravenous Q12H  . vitamin B-12  1,000 mcg Oral Daily   Continuous Infusions: . sodium chloride    . lactated ringers 75 mL/hr at 03/11/19 0624  . piperacillin-tazobactam (ZOSYN)  IV       LOS: 1 day    Time spent: 30 minutes    Kole Hilyard Darleen Crocker, DO [facility] Pager [phone]  If 7PM-7AM, please contact night-coverage [email or web address] Password [email or web address] 03/11/2019, 1:20 PM

## 2019-03-12 ENCOUNTER — Inpatient Hospital Stay (HOSPITAL_COMMUNITY)
Admit: 2019-03-12 | Discharge: 2019-03-12 | Disposition: A | Discharge status: Home / Self Care | Payer: Medicare Other | Attending: General Surgery | Admitting: General Surgery

## 2019-03-12 ENCOUNTER — Encounter (HOSPITAL_COMMUNITY): Payer: Self-pay

## 2019-03-12 LAB — BASIC METABOLIC PANEL
Anion gap: 14 (ref 5–15)
BUN: 11 mg/dL (ref 8–23)
CO2: 21 mmol/L — ABNORMAL LOW (ref 22–32)
Calcium: 8.5 mg/dL — ABNORMAL LOW (ref 8.9–10.3)
Chloride: 99 mmol/L (ref 98–111)
Creatinine, Ser: 0.87 mg/dL (ref 0.61–1.24)
GFR calc Af Amer: >60 mL/min (ref >60)
GFR calc non Af Amer: >60 mL/min (ref >60)
Glucose, Bld: 142 mg/dL — ABNORMAL HIGH (ref 70–99)
Potassium: 3.9 mmol/L (ref 3.5–5.1)
Sodium: 134 mmol/L — ABNORMAL LOW (ref 135–145)

## 2019-03-12 LAB — GLUCOSE, CAPILLARY
Glucose-Capillary: 139 mg/dL — ABNORMAL HIGH (ref 70–99)
Glucose-Capillary: 150 mg/dL — ABNORMAL HIGH (ref 70–99)
Glucose-Capillary: 122 mg/dL — ABNORMAL HIGH (ref 70–99)
Glucose-Capillary: 149 mg/dL — ABNORMAL HIGH (ref 70–99)
Glucose-Capillary: 229 mg/dL — ABNORMAL HIGH (ref 70–99)

## 2019-03-12 LAB — CBC
HCT: 35 % — ABNORMAL LOW (ref 39.0–52.0)
Hemoglobin: 10.7 g/dL — ABNORMAL LOW (ref 13.0–17.0)
MCH: 27.7 pg (ref 26.0–34.0)
MCHC: 30.6 g/dL (ref 30.0–36.0)
MCV: 90.7 fL (ref 80.0–100.0)
Platelets: 375 10*3/uL (ref 150–400)
RBC: 3.86 MIL/uL — ABNORMAL LOW (ref 4.22–5.81)
RDW: 14.9 % (ref 11.5–15.5)
WBC: 16.8 10*3/uL — ABNORMAL HIGH (ref 4.0–10.5)
nRBC: 0 % (ref 0.0–0.2)

## 2019-03-12 LAB — PROTIME-INR
INR: 1.4 — ABNORMAL HIGH (ref 0.8–1.2)
Prothrombin Time: 16.7 seconds — ABNORMAL HIGH (ref 11.4–15.2)

## 2019-03-12 MED ORDER — RIVAROXABAN 20 MG PO TABS
20.0000 mg | ORAL_TABLET | Freq: Every day | ORAL | Status: DC
  Administered 2019-03-12: 20 mg via ORAL
  Filled 2019-03-12: qty 1

## 2019-03-12 MED ORDER — CHLORHEXIDINE GLUCONATE CLOTH 2 % EX PADS
6.0000 | MEDICATED_PAD | Freq: Every day | CUTANEOUS | Status: DC
  Administered 2019-03-12 – 2019-03-13 (×2): 6 via TOPICAL

## 2019-03-12 MED ORDER — SODIUM CHLORIDE 0.9% FLUSH
5.0000 mL | Freq: Three times a day (TID) | INTRAVENOUS | Status: DC
  Administered 2019-03-12 – 2019-03-13 (×3): 5 mL

## 2019-03-12 MED ORDER — FENTANYL CITRATE (PF) 100 MCG/2ML IJ SOLN
INTRAMUSCULAR | Status: AC | PRN
  Administered 2019-03-12 (×4): 50 ug via INTRAVENOUS

## 2019-03-12 MED ORDER — MIDAZOLAM HCL 2 MG/2ML IJ SOLN
INTRAMUSCULAR | Status: AC | PRN
  Administered 2019-03-12 (×4): 1 mg via INTRAVENOUS

## 2019-03-12 NOTE — Progress Notes (Signed)
Patient in [name] undergoing percutaneous catheter drainage of intra-abdominal abscess.

## 2019-03-12 NOTE — Sedation Documentation (Signed)
[facility] here to transport pt back to [facility].

## 2019-03-12 NOTE — Progress Notes (Signed)
PROGRESS NOTE    Charles [name]  XUX:[id number] DOB: [birth year] DOA: 03/10/2019 PCP: [name], MD   Brief Narrative:  Per HPI: Charles Belton Dickersonis a 68 y.o.malewith medical history significant ofstage IV rectal cancer with metastasis to lung and pancreas, DVT, PE, diabetes mellitus type 2, peripheral vascular diseasewho presented to the ER with vomiting, fever. Review of records shows patient was admitted from 02/22/2019-02/26/2019 with acute appendicitis which was managed conservatively with IV antibiotics and IV fluids and patient was discharged home with 10 days of oral Augmentin. Patient calledDr. Constance Haw with general surgery in the officeand was sent into the ER due to recurrent and worsening symptoms. He says he started having nausea and vomiting today and has been having ongoing abdominal pain. Noted to have a fever spike of 102.3 on presentation.  11/6: Patient was admitted with periappendiceal abscess.  General surgery is following with plans to keep patient on IV fluid as well as Zosyn.  Plans for IR intervention for drainage as well.  This is currently pending.  11/7: Patient has been transferred to [facility] today with plans for placement of drain for his periappendiceal abscess.  Plan to continue IV fluid and Zosyn and restart diet after procedure.  Appreciate general surgery recommendations.  Assessment & Plan:   Principal Problem:   Appendicular abscess Active Problems:   GERD   Rectal cancer ([place])   Hx of radiation therapy   Acute pulmonary embolism (HCC)   DVT (deep venous thrombosis) (HCC)   S/P partial lobectomy of lung   Hiatal hernia   Type 2 diabetes mellitus (HCC)   Periappendiceal abscess in the setting of acute appendicitis -Appreciate general surgery evaluation and management -Appreciate IR for drainage on 11/7 -Keep n.p.o. until after procedure -Pain medications as needed -IV fluid resuscitation along with IV Zosyn for empiric  coverage  Hyponatremia-improving -We will maintain on normal saline and recheck labs in a.m.  Stage IV rectal cancer with metastatic disease -Follows Dr. [name] outpatient  History of DVT/PE -Holding Xarelto for now with anticipated IR intervention soon, resume after procedure  Type 2 diabetes -Hemoglobin A1c 7.1% on 02/26/2019 -Hold oral hypoglycemics -Continue SSI  PVD -Continue gabapentin for associated neuropathy  Anemia-stable -Likely related to his stage IV rectal cancer -No signs of overt bleeding -Monitor in a.m.  DVT prophylaxis: Lovenox, hold Xarelto Code Status: Full code Family Communication: Wife at bedside Disposition Plan: Plan for IR to drain periappendiceal abscess.  Continue on IV fluid and IV Zosyn.   Consultants:   Dr. Arnoldo Morale general surgery  IR  Procedures:   Drainage of periappendiceal abscess by IR 11/7  Antimicrobials:  Anti-infectives (From admission, onward)   Start     Dose/Rate Route Frequency Ordered Stop   03/11/19 1400  piperacillin-tazobactam (ZOSYN) IVPB 3.375 g     3.375 g 12.5 mL/hr over 240 Minutes Intravenous Every 8 hours 03/11/19 1143     03/11/19 0000  piperacillin-tazobactam (ZOSYN) IVPB 3.375 g  Status:  Discontinued     3.375 g 100 mL/hr over 30 Minutes Intravenous Every 6 hours 03/10/19 2311 03/11/19 1143   03/10/19 1730  piperacillin-tazobactam (ZOSYN) IVPB 3.375 g     3.375 g 100 mL/hr over 30 Minutes Intravenous  Once 03/10/19 1723 03/10/19 1952       Subjective: Patient seen and evaluated today with no new acute complaints or concerns. No acute concerns or events noted overnight.  Objective: Vitals:   03/11/19 1600 03/11/19 2123 03/12/19 0411 03/12/19 0500  BP:  126/71 113/66   Pulse:  (!) 106 (!) 120   Resp:  16 17   Temp: (!) 100.9 F (38.3 C) 98.4 F (36.9 C) 98.2 F (36.8 C)   TempSrc: Oral     SpO2:  100% 99%   Weight:    75.2 kg  Height:        Intake/Output Summary (Last 24  hours) at 03/12/2019 0933 Last data filed at 03/12/2019 0019 Gross per 24 hour  Intake 1290.98 ml  Output 225 ml  Net 1065.98 ml   Filed Weights   03/10/19 1500 03/12/19 0500  Weight: 73 kg 75.2 kg    Examination:  General exam: Appears calm and comfortable  Respiratory system: Clear to auscultation. Respiratory effort normal. Cardiovascular system: S1 & S2 heard, RRR. No JVD, murmurs, rubs, gallops or clicks. No pedal edema. Gastrointestinal system: Abdomen is nondistended, soft and nontender. No organomegaly or masses felt. Normal bowel sounds heard.  Tenderness to right lower quadrant appreciated.  Colostomy bag clean dry and intact. Central nervous system: Alert and oriented. No focal neurological deficits. Extremities: Symmetric 5 x 5 power. Skin: No rashes, lesions or ulcers Psychiatry: Judgement and insight appear normal. Mood & affect appropriate.     Data Reviewed: I have personally reviewed following labs and imaging studies  CBC: Recent Labs  Lab 03/10/19 1559 03/11/19 0531 03/12/19 0650  WBC 16.2* 16.4* 16.8*  NEUTROABS 13.2*  --   --   HGB 11.0* 9.9* 10.7*  HCT 35.7* 32.5* 35.0*  MCV 91.3 90.8 90.7  PLT 421* 360 326   Basic Metabolic Panel: Recent Labs  Lab 03/10/19 1559 03/11/19 0531 03/12/19 0650  NA 129* 133* 134*  K 4.2 4.1 3.9  CL 95* 101 99  CO2 24 22 21*  GLUCOSE 253* 124* 142*  BUN 15 10 11   CREATININE 1.11 0.78 0.87  CALCIUM 8.4* 8.4* 8.5*   GFR: Estimated Creatinine Clearance: 86.4 mL/min (by C-G formula based on SCr of 0.87 mg/dL). Liver Function Tests: Recent Labs  Lab 03/10/19 1559  AST 17  ALT 15  ALKPHOS 72  BILITOT 1.3*  PROT 7.0  ALBUMIN 2.9*   Recent Labs  Lab 03/10/19 1559  LIPASE 16   No results for input(s): AMMONIA in the last 168 hours. Coagulation Profile: Recent Labs  Lab 03/11/19 0835 03/12/19 0650  INR 1.5* 1.4*   Cardiac Enzymes: No results for input(s): CKTOTAL, CKMB, CKMBINDEX, TROPONINI in the  last 168 hours. BNP (last 3 results) No results for input(s): PROBNP in the last 8760 hours. HbA1C: No results for input(s): HGBA1C in the last 72 hours. CBG: Recent Labs  Lab 03/11/19 1546 03/11/19 2015 03/11/19 2333 03/12/19 0408 03/12/19 0729  GLUCAP 88 132* 118* 139* 150*   Lipid Profile: No results for input(s): CHOL, HDL, LDLCALC, TRIG, CHOLHDL, LDLDIRECT in the last 72 hours. Thyroid Function Tests: No results for input(s): TSH, T4TOTAL, FREET4, T3FREE, THYROIDAB in the last 72 hours. Anemia Panel: No results for input(s): VITAMINB12, FOLATE, FERRITIN, TIBC, IRON, RETICCTPCT in the last 72 hours. Sepsis Labs: Recent Labs  Lab 03/10/19 1559 03/10/19 1853  LATICACIDVEN 2.5* 1.2    Recent Results (from the past 240 hour(s))  Culture, blood (routine x 2)     Status: None (Preliminary result)   Collection Time: 03/10/19  5:17 PM   Specimen: Right Antecubital; Blood  Result Value Ref Range Status   Specimen Description RIGHT ANTECUBITAL  Final   Special Requests   Final  BOTTLES DRAWN AEROBIC ONLY Blood Culture adequate volume   Culture   Final    NO GROWTH 2 DAYS Performed at [facility], [address], [place], [place] [address]    Report Status PENDING  Incomplete  Culture, blood (routine x 2)     Status: None (Preliminary result)   Collection Time: 03/10/19  5:18 PM   Specimen: Left Antecubital; Blood  Result Value Ref Range Status   Specimen Description LEFT ANTECUBITAL  Final   Special Requests   Final    BOTTLES DRAWN AEROBIC AND ANAEROBIC Blood Culture adequate volume   Culture   Final    NO GROWTH 2 DAYS Performed at [facility], [address] [place], [place] [address]    Report Status PENDING  Incomplete  SARS CORONAVIRUS 2 (TAT 6-24 HRS)     Status: None   Collection Time: 03/10/19  9:15 PM  Result Value Ref Range Status   SARS Coronavirus 2 NEGATIVE NEGATIVE Final    Comment: (NOTE) SARS-CoV-2 target nucleic acids are NOT DETECTED. The  SARS-CoV-2 RNA is generally detectable in upper and lower respiratory specimens during the acute phase of infection. Negative results do not preclude SARS-CoV-2 infection, do not rule out co-infections with other pathogens, and should not be used as the sole basis for treatment or other patient management decisions. Negative results must be combined with clinical observations, patient history, and epidemiological information. The expected result is Negative. Fact Sheet for Patients: SugarRoll.be Fact Sheet for Healthcare Providers: https://www.woods-mathews.com/ This test is not yet approved or cleared by the [place] FDA and  has been authorized for detection and/or diagnosis of SARS-CoV-2 by FDA under an Emergency Use Authorization (EUA). This EUA will remain  in effect (meaning this test can be used) for the duration of the COVID-19 declaration under Section 56 4(b)(1) of the Act, 21 U.S.C. section 360bbb-3(b)(1), unless the authorization is terminated or revoked sooner. Performed at [facility], [name] [address] [place], [place] [address]   Surgical PCR screen     Status: Abnormal   Collection Time: 03/11/19 11:59 AM   Specimen: Nasal Mucosa; Nasal Swab  Result Value Ref Range Status   MRSA, PCR NEGATIVE NEGATIVE Final   Staphylococcus aureus POSITIVE (A) NEGATIVE Final    Comment: (NOTE) The Xpert SA Assay (FDA approved for NASAL specimens in patients 44 years of age and older), is one component of a comprehensive surveillance program. It is not intended to diagnose infection nor to guide or monitor treatment. Performed at [facility], [address] [place], [place] [address]          Radiology Studies: Ct Abdomen Pelvis W Contrast  Result Date: 03/10/2019 CLINICAL DATA:  Right lower quadrant pain history of appendicitis without surgery history of rectal cancer lung [place] EXAM: CT ABDOMEN AND PELVIS WITH CONTRAST  TECHNIQUE: Multidetector CT imaging of the abdomen and pelvis was performed using the standard protocol following bolus administration of intravenous contrast. CONTRAST:  143mL OMNIPAQUE IOHEXOL 300 MG/ML  SOLN COMPARISON:  CT 02/22/2019, 09/22/2018, MRI 09/10/2018, CT 07/30/2018 FINDINGS: Lower chest: Lung bases demonstrate no acute consolidation or effusion. Stable scarring and nodularity within the posterior right lung base and within the right middle lobe. Stable 7 mm anterior right upper lobe lung nodule. Heart size is normal Hepatobiliary: No focal hepatic abnormality. No calcified gallstone. Mild wall thickening of the gallbladder fundus. Pancreas: Diffusely atrophic pancreas without inflammatory change. Hypoenhancing mass at the posterior head of the pancreas measuring  2.1 by 2 cm, grossly stable. Spleen: Normal in size without focal abnormality. Adrenals/Urinary Tract: Adrenal glands are within normal limits aside from left adrenal calcification. Kidneys show no hydronephrosis. 9 mm exophytic solid-appearing mass lower pole left kidney. Stomach/Bowel: The stomach is nonenlarged. No dilated small bowel. Left abdominal loop colostomy. Rectal anastomosis with mild rectal wall thickening, similar compared to most recent prior. Enlarged appendix measuring up to 16 mm. Persistent considerable inflammatory change about the appendix. Irregular rim enhancing fluid collection in the right lower quadrant measuring 3.9 by 3.4 by 3.8 cm consistent with periappendiceal abscess and perforated appendix. Appendicoliths within the rim enhancing fluid collection. Vascular/Lymphatic: Moderate aortic atherosclerosis. Mild aortic atherosclerosis without aneurysm. Subcentimeter right lower quadrant lymph nodes. IVC filter tip just below the level of renal vein confluence. Protrusion of filter struts beyond the IVC luminal margin. Reproductive: Prostate calcification Other: Presacral soft tissue stranding and small fluid.  No free  air Musculoskeletal: Irregular sclerotic and lucent lesion of the inferior sternum as before. No new osseous abnormality IMPRESSION: 1. Continued considerable inflammatory changes in the right lower quadrant surrounding an abnormal appendix. Interval perforation of the appendix with new irregular rim enhancing fluid collection measuring up to 3.9 cm, compatible with periappendiceal abscess. There is an appendicoliths in the abscess fluid collection. There is no extraluminal gas or free air. 2. Status post partial proctocolectomy with left lower quadrant loop colostomy. Mild residual rectal wall thickening 3. Stable diffuse atrophy of the pancreas with ductal dilatation and 2.2 cm obstructing mass at the posterior head of the pancreas 4. Stable 9 mm solid appearing exophytic lesion lower pole left kidney 5. Stable lucent and sclerotic lesion involving the lower sternum Electronically Signed   By: Donavan Foil M.D.   On: 03/10/2019 19:13        Scheduled Meds: . Chlorhexidine Gluconate Cloth  6 each Topical Daily  . enoxaparin (LOVENOX) injection  40 mg Subcutaneous Q24H  . gabapentin  600 mg Oral BID  . insulin aspart  0-8 Units Subcutaneous Q4H  . mirtazapine  7.5 mg Oral QHS  . mupirocin ointment  1 application Nasal BID  . pantoprazole (PROTONIX) IV  40 mg Intravenous Q24H  . sodium chloride flush  3 mL Intravenous Q12H  . vitamin B-12  1,000 mcg Oral Daily   Continuous Infusions: . sodium chloride    . sodium chloride 75 mL/hr at 03/11/19 1357  . piperacillin-tazobactam (ZOSYN)  IV 3.375 g (03/12/19 0530)     LOS: 2 days    Time spent: 48 minutes    Monnie Gudgel Darleen Crocker, DO [facility] Pager [phone]  If 7PM-7AM, please contact night-coverage [email or web address] Password [facility] 03/12/2019, 9:33 AM

## 2019-03-12 NOTE — Sedation Documentation (Signed)
Pts wife, Jaicob Dia, called and updated on drain placement. Pt spoke with wife. Awaiting transport back to [facility].

## 2019-03-12 NOTE — Procedures (Signed)
  Procedure: CT RLQ abscess drain catheter placement 77ml purulent aspirate for GS, C&S EBL:   minimal Complications:  none immediate  See full dictation in [facility].  Dillard Cannon MD Main # [phone] Pager  [phone]

## 2019-03-12 NOTE — H&P (Signed)
Chief Complaint: RLQ pain  Referring Physician(s): Aviva Signs  Supervising Physician: Arne Cleveland  Patient Status: Charles [name] IP (transferred to [facility] for procedure then transferred back post procedure)  History of Present Illness: Charles [name] is a 68 y.o. male who was admitted from 02/22/2019-02/26/2019 with acute appendicitis.  It was managed conservatively with IV antibiotics and IV fluids.  He was discharged home with 10 days of oral Augmentin.    He called Dr. Constance Haw with general surgery on 11/5 and was sent into the ER due to recurrent and worsening symptoms.   He started having nausea and vomiting and ongoing abdominal pain.    He had a temp of 102.3 on presentation.  CT showed = 1. Continued considerable inflammatory changes in the right lower quadrant surrounding an abnormal appendix. Interval perforation of the appendix with new irregular rim enhancing fluid collection measuring up to 3.9 cm, compatible with periappendiceal abscess. There is an appendicoliths in the abscess fluid collection. There is no extraluminal gas or free air.  We are asked to place a drain.  He is NPO.  Past Medical History:  Diagnosis Date   Chronic anticoagulation    Colon cancer ([place]) 07/24/2010   rectal ca, inv adenocarcinoma   Depression 04/01/2011   Diabetes mellitus without complication ([place])    DVT (deep venous thrombosis) ([place]) 05/09/2011   GERD (gastroesophageal reflux disease)    High output ileostomy ([place]) 05/21/2011   History of kidney stones    HTN (hypertension)    Hx of radiation therapy 09/02/10 to 10/14/10   pelvis   Lung metastasis ([place])    Neuropathy    Peripheral vascular disease ([place])    dvt's,pe   Pneumonia    hx x3   Pulmonary embolism ([place])    Rectal cancer ([place]) 01/15/2011   S/P radiation and concurrent 5-FU continuous infusion from 09/09/10- 10/10/10.  S/P proctectomy with colorectal anastomosis and diverting loop ileostomy  on 11/14/10 at [facility] by Dr. Harlon Ditty. Pathology reveals a pT3b N1 with 3/20 lymph nodes.      Past Surgical History:  Procedure Laterality Date   BILIARY STENT PLACEMENT N/A 09/16/2018   Procedure: STENT PLACEMENT;  Surgeon: Rogene Houston, MD;  [facility];  Service: Endoscopy;  Laterality: N/A;   BIOPSY  09/24/2018   Procedure: BIOPSY;  Surgeon: Danie Binder, MD;  [facility];  Service: Endoscopy;;   COLON SURGERY  11/14/2010   proctectomy with colorectal anastomosis and diverting loop ileostomy (temporary planned)   COLONOSCOPY  07/2010   proximal rectal apple core mass 10-14cm from anal verge (adenocarcinoma), 2-3cm distal rectal carpet polyp s/p piecemeal snare polypectomy (adenoma)   COLONOSCOPY  04/21/2012   RMR: Friable,fibrotic appearing colorectal anastomosis producing some luminal narrowing-not felt to be critical. path: focal erosion with slight inflammation and hyperemia. SURVEILLANCE DUE DEC 2015   COLONOSCOPY N/A 11/24/2013   Dr. Rourk:somewhat fibrotic/friable anastomotic mucosal-status post biopsy (narrowing not felt to be clinically significant) Single colonic diverticulum. benign polypoid rectal mucosa   colostomy reversal  april 2013   ERCP N/A 09/16/2018   Procedure: ENDOSCOPIC RETROGRADE CHOLANGIOPANCREATOGRAPHY (ERCP);  Surgeon: Rogene Houston, MD;  [facility];  Service: Endoscopy;  Laterality: N/A;   ESOPHAGOGASTRODUODENOSCOPY  07/2010   RMR: schatki ring s/p dilation, small hh, SB bx benign   ESOPHAGOGASTRODUODENOSCOPY N/A 09/04/2015   Procedure: ESOPHAGOGASTRODUODENOSCOPY (EGD);  Surgeon: Daneil Dolin, MD;  [facility];  Service: Endoscopy;  Laterality: N/A;  730   EUS  08/2010   Dr. Owens Loffler. uT3N0 circumferential, nearly obstruction rectosigmoid adenocarcinoma, distal edge 12cm from anal verge   EUS N/A 06/19/2016   Procedure: UPPER ENDOSCOPIC ULTRASOUND (EUS) RADIAL;  Surgeon: Milus Banister, MD;  Location: [facility];  Service: Endoscopy;  Laterality: N/A;   FLEXIBLE SIGMOIDOSCOPY N/A 09/24/2018   Procedure: FLEXIBLE SIGMOIDOSCOPY;  Surgeon: Danie Binder, MD;  [facility];  Service: Endoscopy;  Laterality: N/A;   HERNIA REPAIR     abd hernia repair   IVC filter     ivc filter     port a cath placement     PORT-A-CATH REMOVAL  09/24/2011   Procedure: REMOVAL PORT-A-CATH;  Surgeon: Donato Heinz, MD;  Location: [facility];  Service: General;  Laterality: N/A;  Minor Room   PORTACATH PLACEMENT Right 07/07/2016   Procedure: INSERTION PORT-A-CATH;  Surgeon: Aviva Signs, MD;  Location: [facility];  Service: General;  Laterality: Right;   SPHINCTEROTOMY N/A 09/16/2018   Procedure: SPHINCTEROTOMY with balloon dialation;  Surgeon: Rogene Houston, MD;  [facility];  Service: Endoscopy;  Laterality: N/A;   TRANSVERSE LOOP COLOSTOMY N/A 09/29/2018   Procedure: TRANSVERSE LOOP COLOSTOMY;  Surgeon: Aviva Signs, MD;  Location: [facility];  Service: General;  Laterality: N/A;   VIDEO ASSISTED THORACOSCOPY (VATS)/WEDGE RESECTION Right 11/15/2014   Procedure: VIDEO ASSISTED THORACOSCOPY (VATS)/LUNG RESECTION WITH RIGHT LINGULECTOMY;  Surgeon: [name], MD;  Location: [place];  Service: Thoracic;  Laterality: Right;   VIDEO BRONCHOSCOPY N/A 11/15/2014   Procedure: VIDEO BRONCHOSCOPY;  Surgeon: [name], MD;  Location: [facility];  Service: Thoracic;  Laterality: N/A;    Allergies: Oxycodone, Tramadol, and Trazodone and nefazodone  Medications: Prior to Admission medications   Medication Sig Start Date End Date Taking? Authorizing Provider  acetaminophen (TYLENOL) 500 MG tablet Take 1,000 mg by mouth every 6 (six) hours as needed for mild pain or moderate pain.     [provider]  amoxicillin-clavulanate (AUGMENTIN) 875-125 MG tablet Take 1 tablet by mouth every 12 (twelve) hours. Patient not taking: Reported on 03/10/2019 02/26/19   Charles Eva, MD  clonazePAM (KLONOPIN) 1 MG tablet Take 1 tablet by mouth three times daily as needed Patient taking differently: Take 1 mg by mouth 3 (three) times daily.  02/07/19   Charles Jack, MD  Diphenhyd-Hydrocort-Nystatin (FIRST-DUKES MOUTHWASH) SUSP Use as directed 5 mLs in the mouth or throat 4 (four) times daily. Patient taking differently: Use as directed 5 mLs in the mouth or throat 2 (two) times daily.  10/07/18   Charles Jack, MD  gabapentin (NEURONTIN) 300 MG capsule TAKE 3 CAPSULES BY MOUTH TWICE DAILY Patient taking differently: Take 900 mg by mouth 2 (two) times daily.  01/27/19   Charles Jack, MD  glimepiride (AMARYL) 2 MG tablet Take 2 mg by mouth 2 (two) times daily before a meal.     [provider]  levofloxacin (LEVAQUIN) 500 MG tablet Take 500 mg by mouth daily. 7 day course starting on 03/10/2019 03/10/19   [provider]  mirtazapine (REMERON) 7.5 MG tablet Take 7.5 mg by mouth at bedtime.  11/10/18   [provider]  ondansetron (ZOFRAN) 8 MG tablet Take 1 tablet (8 mg total) by mouth 2 (two) times daily as needed for refractory nausea / vomiting. Start on day 3 after chemotherapy. 10/01/18   Roxan Hockey, MD  pantoprazole (PROTONIX) 40 MG tablet Take 40  mg by mouth every evening.  12/13/18   [provider]  Probiotic Product (PROBIOTIC DAILY PO) Take 1 capsule by mouth 2 (two) times daily.     [provider]  rivaroxaban (XARELTO) 20 MG TABS tablet Take 1 tablet (20 mg total) by mouth daily with supper. 09/19/18   Rehman, Mechele Dawley, MD  vitamin B-12 (CYANOCOBALAMIN) 1000 MCG tablet Take 1,000 mcg by mouth daily.    [provider]     Family History  Problem Relation Age of Onset   Cancer Brother        throat   Cancer Brother        prostate   Colon cancer Neg Hx    Liver disease Neg Hx    Inflammatory bowel disease Neg Hx     Social History   Socioeconomic History   Marital status:  Married    Spouse name: Not on file   Number of children: 2   Years of education: Not on file   Highest education level: Not on file  Occupational History   Occupation: self-employed Regulatory affairs officer, currently not working    Fish farm manager: [employer] resource strain: Not very hard   Food insecurity    Worry: Never true    Inability: Never true   Transportation needs    Medical: No    Non-medical: No  Tobacco Use   Smoking status: Never Smoker   Smokeless tobacco: Never Used  Substance and Sexual Activity   Alcohol use: No   Drug use: No   Sexual activity: Yes    Birth control/protection: None    Comment: married  Lifestyle   Physical activity    Days per week: 0 days    Minutes per session: 0 min   Stress: Only a little  Relationships   Social connections    Talks on phone: More than three times a week    Gets together: Once a week    Attends religious service: Patient refused    Active member of club or organization: Patient refused    Attends meetings of clubs or organizations: Patient refused    Relationship status: Patient refused  Other Topics Concern   Not on file  Social History Narrative   Not on file     Review of Systems: A 12 point ROS discussed and pertinent positives are indicated in the HPI above.  All other systems are negative.  Review of Systems   Physical Exam Constitutional:      Appearance: Normal appearance.  HENT:     Head: Normocephalic and atraumatic.  Eyes:     Extraocular Movements: Extraocular movements intact.  Neck:     Musculoskeletal: Normal range of motion.  Cardiovascular:     Rate and Rhythm: Normal rate and regular rhythm.  Pulmonary:     Effort: Pulmonary effort is normal.     Breath sounds: Normal breath sounds.  Abdominal:     Palpations: Abdomen is soft.  Musculoskeletal: Normal range of motion.  Skin:    General: Skin is warm and dry.  Neurological:      General: No focal deficit present.     Mental Status: He is alert and oriented to person, place, and time.  Psychiatric:        Mood and Affect: Mood normal.        Behavior: Behavior normal.        Thought Content: Thought content normal.  Judgment: Judgment normal.     Imaging: Ct Chest W Contrast  Result Date: 02/22/2019 CLINICAL DATA:  Nausea vomiting diarrhea history of rectal cancer with metastatic disease EXAM: CT CHEST, ABDOMEN, AND PELVIS WITH CONTRAST TECHNIQUE: Multidetector CT imaging of the chest, abdomen and pelvis was performed following the standard protocol during bolus administration of intravenous contrast. CONTRAST:  163mL OMNIPAQUE IOHEXOL 300 MG/ML  SOLN COMPARISON:  CT 09/22/2018, 07/30/2018, 05/28/2018 FINDINGS: CT CHEST FINDINGS Cardiovascular: Nonaneurysmal aorta. Mild aortic atherosclerosis. Coronary vascular calcification. Normal heart size. No pericardial effusion. Right-sided central venous port with tip at the distal SVC. Mediastinum/Nodes: Midline trachea. No thyroid mass. No increasing mediastinal adenopathy. Esophagus within normal limits. Lungs/Pleura: No acute consolidation or pleural effusion. Stable 6 mm anterior right upper lobe pulmonary nodule, series 3, image number 106, previously 6 mm. Posterior right upper lobe pulmonary nodule, series 3, image number 41 measures 11 mm, previously 11 mm. Posterior right upper lobe 11 mm pulmonary nodule, series 3, image number 75, previously 13 mm. No new pulmonary nodule. Right middle lobe postsurgical changes Musculoskeletal: Stable mixed sclerotic and lucent lesion in the lower sternum. Degenerative changes of the spine. CT ABDOMEN PELVIS FINDINGS Hepatobiliary: Contracted gallbladder without calcified stone. No focal hepatic abnormality. No ductal dilatation Pancreas: Diffusely atrophic. Diffuse pancreatic duct dilatation with truncation at the pancreatic head. Stable 2.1 cm mass at the head of the pancreas.  Spleen: Normal in size without focal abnormality. Adrenals/Urinary Tract: Left adrenal calcification. No adrenal mass. Kidneys show no hydronephrosis. Stable subcentimeter exophytic slightly dense lesion off the lower pole left kidney. The bladder is slightly thick walled. Stomach/Bowel: The stomach is nonenlarged. No dilated small bowel. Status post partial proctocolectomy decreased rectal wall thickening compared to most recent prior. Interval left lower quadrant loop colostomy. Abnormal appendix. Small appendicoliths. Considerable periappendiceal inflammatory change. Appendix tip dilated up to 2.1 cm. No extraluminal gas. Possible tiny 7 mm fluid collection adjacent to tip of appendix. Vascular/Lymphatic: IVC filter with tip just below the renal vein confluence. Similar protrusion struts beyond the IVC lumen. Nonaneurysmal aorta with mild aortic atherosclerosis. Subcentimeter lymph nodes in the right lower quadrant could be reactive. Reproductive: Prostate calcification Other: Mild presacral soft tissue stranding. No free fluid. No significant pelvic effusion Musculoskeletal: No acute or suspicious osseous abnormality IMPRESSION: 1. Findings consistent with acute appendicitis. No definitive extraluminal gas though considerable inflammatory change about the appendix and possible tiny 7 mm tiny fluid collection adjacent to the tip of the appendix. Appendix: Location: Right lower quadrant Diameter: Up to 21 mm Appendicolith: Positive for stone near the tip Mucosal hyper-enhancement: Negative Extraluminal gas: Negative Periappendiceal collection: Possible tiny 7 mm fluid collection adjacent to the tip of the appendix. 2. Status post partial proctocolectomy with interval left lower quadrant loop colostomy. Decreased rectal wall thickening. 3.  Stable right upper lobe pulmonary nodules. 4. Stable 2 cm mass at the head of pancreas with atrophy ductal obstruction distal to this 5. Stable mixed lucent and sclerotic lesion  involving the lower sternum Electronically Signed   By: Donavan Foil M.D.   On: 02/22/2019 21:40   Ct Abdomen Pelvis W Contrast  Result Date: 03/10/2019 CLINICAL DATA:  Right lower quadrant pain history of appendicitis without surgery history of rectal cancer lung [place] EXAM: CT ABDOMEN AND PELVIS WITH CONTRAST TECHNIQUE: Multidetector CT imaging of the abdomen and pelvis was performed using the standard protocol following bolus administration of intravenous contrast. CONTRAST:  157mL OMNIPAQUE IOHEXOL 300 MG/ML  SOLN COMPARISON:  CT 02/22/2019,  09/22/2018, MRI 09/10/2018, CT 07/30/2018 FINDINGS: Lower chest: Lung bases demonstrate no acute consolidation or effusion. Stable scarring and nodularity within the posterior right lung base and within the right middle lobe. Stable 7 mm anterior right upper lobe lung nodule. Heart size is normal Hepatobiliary: No focal hepatic abnormality. No calcified gallstone. Mild wall thickening of the gallbladder fundus. Pancreas: Diffusely atrophic pancreas without inflammatory change. Hypoenhancing mass at the posterior head of the pancreas measuring 2.1 by 2 cm, grossly stable. Spleen: Normal in size without focal abnormality. Adrenals/Urinary Tract: Adrenal glands are within normal limits aside from left adrenal calcification. Kidneys show no hydronephrosis. 9 mm exophytic solid-appearing mass lower pole left kidney. Stomach/Bowel: The stomach is nonenlarged. No dilated small bowel. Left abdominal loop colostomy. Rectal anastomosis with mild rectal wall thickening, similar compared to most recent prior. Enlarged appendix measuring up to 16 mm. Persistent considerable inflammatory change about the appendix. Irregular rim enhancing fluid collection in the right lower quadrant measuring 3.9 by 3.4 by 3.8 cm consistent with periappendiceal abscess and perforated appendix. Appendicoliths within the rim enhancing fluid collection. Vascular/Lymphatic: Moderate aortic atherosclerosis.  Mild aortic atherosclerosis without aneurysm. Subcentimeter right lower quadrant lymph nodes. IVC filter tip just below the level of renal vein confluence. Protrusion of filter struts beyond the IVC luminal margin. Reproductive: Prostate calcification Other: Presacral soft tissue stranding and small fluid.  No free air Musculoskeletal: Irregular sclerotic and lucent lesion of the inferior sternum as before. No new osseous abnormality IMPRESSION: 1. Continued considerable inflammatory changes in the right lower quadrant surrounding an abnormal appendix. Interval perforation of the appendix with new irregular rim enhancing fluid collection measuring up to 3.9 cm, compatible with periappendiceal abscess. There is an appendicoliths in the abscess fluid collection. There is no extraluminal gas or free air. 2. Status post partial proctocolectomy with left lower quadrant loop colostomy. Mild residual rectal wall thickening 3. Stable diffuse atrophy of the pancreas with ductal dilatation and 2.2 cm obstructing mass at the posterior head of the pancreas 4. Stable 9 mm solid appearing exophytic lesion lower pole left kidney 5. Stable lucent and sclerotic lesion involving the lower sternum Electronically Signed   By: Donavan Foil M.D.   On: 03/10/2019 19:13   Ct Abdomen Pelvis W Contrast  Result Date: 02/22/2019 CLINICAL DATA:  Nausea vomiting diarrhea history of rectal cancer with metastatic disease EXAM: CT CHEST, ABDOMEN, AND PELVIS WITH CONTRAST TECHNIQUE: Multidetector CT imaging of the chest, abdomen and pelvis was performed following the standard protocol during bolus administration of intravenous contrast. CONTRAST:  135mL OMNIPAQUE IOHEXOL 300 MG/ML  SOLN COMPARISON:  CT 09/22/2018, 07/30/2018, 05/28/2018 FINDINGS: CT CHEST FINDINGS Cardiovascular: Nonaneurysmal aorta. Mild aortic atherosclerosis. Coronary vascular calcification. Normal heart size. No pericardial effusion. Right-sided central venous port with  tip at the distal SVC. Mediastinum/Nodes: Midline trachea. No thyroid mass. No increasing mediastinal adenopathy. Esophagus within normal limits. Lungs/Pleura: No acute consolidation or pleural effusion. Stable 6 mm anterior right upper lobe pulmonary nodule, series 3, image number 106, previously 6 mm. Posterior right upper lobe pulmonary nodule, series 3, image number 41 measures 11 mm, previously 11 mm. Posterior right upper lobe 11 mm pulmonary nodule, series 3, image number 75, previously 13 mm. No new pulmonary nodule. Right middle lobe postsurgical changes Musculoskeletal: Stable mixed sclerotic and lucent lesion in the lower sternum. Degenerative changes of the spine. CT ABDOMEN PELVIS FINDINGS Hepatobiliary: Contracted gallbladder without calcified stone. No focal hepatic abnormality. No ductal dilatation Pancreas: Diffusely atrophic. Diffuse pancreatic duct dilatation  with truncation at the pancreatic head. Stable 2.1 cm mass at the head of the pancreas. Spleen: Normal in size without focal abnormality. Adrenals/Urinary Tract: Left adrenal calcification. No adrenal mass. Kidneys show no hydronephrosis. Stable subcentimeter exophytic slightly dense lesion off the lower pole left kidney. The bladder is slightly thick walled. Stomach/Bowel: The stomach is nonenlarged. No dilated small bowel. Status post partial proctocolectomy decreased rectal wall thickening compared to most recent prior. Interval left lower quadrant loop colostomy. Abnormal appendix. Small appendicoliths. Considerable periappendiceal inflammatory change. Appendix tip dilated up to 2.1 cm. No extraluminal gas. Possible tiny 7 mm fluid collection adjacent to tip of appendix. Vascular/Lymphatic: IVC filter with tip just below the renal vein confluence. Similar protrusion struts beyond the IVC lumen. Nonaneurysmal aorta with mild aortic atherosclerosis. Subcentimeter lymph nodes in the right lower quadrant could be reactive. Reproductive:  Prostate calcification Other: Mild presacral soft tissue stranding. No free fluid. No significant pelvic effusion Musculoskeletal: No acute or suspicious osseous abnormality IMPRESSION: 1. Findings consistent with acute appendicitis. No definitive extraluminal gas though considerable inflammatory change about the appendix and possible tiny 7 mm tiny fluid collection adjacent to the tip of the appendix. Appendix: Location: Right lower quadrant Diameter: Up to 21 mm Appendicolith: Positive for stone near the tip Mucosal hyper-enhancement: Negative Extraluminal gas: Negative Periappendiceal collection: Possible tiny 7 mm fluid collection adjacent to the tip of the appendix. 2. Status post partial proctocolectomy with interval left lower quadrant loop colostomy. Decreased rectal wall thickening. 3.  Stable right upper lobe pulmonary nodules. 4. Stable 2 cm mass at the head of pancreas with atrophy ductal obstruction distal to this 5. Stable mixed lucent and sclerotic lesion involving the lower sternum Electronically Signed   By: Donavan Foil M.D.   On: 02/22/2019 21:40    Labs:  CBC: Recent Labs    02/26/19 0609 03/10/19 1559 03/11/19 0531 03/12/19 0650  WBC 12.3* 16.2* 16.4* 16.8*  HGB 10.4* 11.0* 9.9* 10.7*  HCT 33.3* 35.7* 32.5* 35.0*  PLT 319 421* 360 375    COAGS: Recent Labs    09/26/18 1108 10/16/18 1828 02/23/19 1552 02/24/19 0110 03/11/19 0835 03/12/19 0650  INR  --  1.4*  --   --  1.5* 1.4*  APTT 52*  --  51* >200*  --   --     BMP: Recent Labs    02/25/19 0447 03/10/19 1559 03/11/19 0531 03/12/19 0650  [name] 138 129* 133* 134*  K 3.6 4.2 4.1 3.9  CL 107 95* 101 99  CO2 23 24 22  21*  GLUCOSE 134* 253* 124* 142*  BUN 6* 15 10 11   CALCIUM 7.8* 8.4* 8.4* 8.5*  CREATININE 0.75 1.11 0.78 0.87  GFRNONAA >60 >60 >60 >60  GFRAA >60 >60 >60 >60    LIVER FUNCTION TESTS: Recent Labs    02/09/19 0827 02/22/19 1749 02/23/19 0404 03/10/19 1559  BILITOT 0.4 0.7 0.8 1.3*    AST 17 14* 13* 17  ALT 16 17 15 15   ALKPHOS 118 103 95 72  PROT 5.5* 5.9* 5.2* 7.0  ALBUMIN 3.0* 2.9* 2.5* 2.9*    TUMOR MARKERS: No results for input(s): AFPTM, CEA, CA199, CHROMGRNA in the last 8760 hours.  Assessment and Plan:  New irregular rim enhancing fluid collection measuring up to 3.9 cm, compatible with periappendiceal abscess.  Will proceed with placement of drain today by Dr. Vernard Gambles.  Risks and benefits discussed with the patient including bleeding, infection, damage to adjacent structures, bowel perforation/fistula connection, and  sepsis.  All of the patient's questions were answered, patient is agreeable to proceed. Consent signed and in chart.  Thank you for this interesting consult.  I greatly enjoyed meeting JAYRO MCMATH and look forward to participating in their care.  A copy of this report was sent to the requesting provider on this date.  Electronically Signed: Murrell Redden, PA-C   03/12/2019, 9:00 AM      I spent a total of 40 Minutes in face to face in clinical consultation, greater than 50% of which was counseling/coordinating care for RLQ drain.

## 2019-03-13 LAB — CBC
HCT: 31.1 % — ABNORMAL LOW (ref 39.0–52.0)
Hemoglobin: 9.4 g/dL — ABNORMAL LOW (ref 13.0–17.0)
MCH: 27.5 pg (ref 26.0–34.0)
MCHC: 30.2 g/dL (ref 30.0–36.0)
MCV: 90.9 fL (ref 80.0–100.0)
Platelets: 297 10*3/uL (ref 150–400)
RBC: 3.42 MIL/uL — ABNORMAL LOW (ref 4.22–5.81)
RDW: 14.6 % (ref 11.5–15.5)
WBC: 11.3 10*3/uL — ABNORMAL HIGH (ref 4.0–10.5)
nRBC: 0 % (ref 0.0–0.2)

## 2019-03-13 LAB — GLUCOSE, CAPILLARY
Glucose-Capillary: 124 mg/dL — ABNORMAL HIGH (ref 70–99)
Glucose-Capillary: 127 mg/dL — ABNORMAL HIGH (ref 70–99)
Glucose-Capillary: 231 mg/dL — ABNORMAL HIGH (ref 70–99)

## 2019-03-13 LAB — BASIC METABOLIC PANEL
Anion gap: 11 (ref 5–15)
BUN: 13 mg/dL (ref 8–23)
CO2: 23 mmol/L (ref 22–32)
Calcium: 8.3 mg/dL — ABNORMAL LOW (ref 8.9–10.3)
Chloride: 98 mmol/L (ref 98–111)
Creatinine, Ser: 0.71 mg/dL (ref 0.61–1.24)
GFR calc Af Amer: >60 mL/min (ref >60)
GFR calc non Af Amer: >60 mL/min (ref >60)
Glucose, Bld: 168 mg/dL — ABNORMAL HIGH (ref 70–99)
Potassium: 4.1 mmol/L (ref 3.5–5.1)
Sodium: 132 mmol/L — ABNORMAL LOW (ref 135–145)

## 2019-03-13 MED ORDER — ONDANSETRON HCL 4 MG PO TABS: 4.0000 mg | ORAL_TABLET | Freq: Every day | ORAL | 1 refills | Status: DC | PRN

## 2019-03-13 MED ORDER — AMOXICILLIN-POT CLAVULANATE 875-125 MG PO TABS: 1.0000 | ORAL_TABLET | Freq: Two times a day (BID) | ORAL | 0 refills | Status: DC

## 2019-03-13 NOTE — Discharge Instructions (Signed)
Percutaneous Abscess Drain, Care After °This sheet gives you information about how to care for yourself after your procedure. Your health care provider may also give you more specific instructions. If you have problems or questions, contact your health care provider. °What can I expect after the procedure? °After your procedure, it is common to have: °· A small amount of bruising and discomfort in the area where the drainage tube (catheter) was placed. °· Sleepiness and fatigue. This should go away after the medicines you were given have worn off. °Follow these instructions at home: °Incision care °· Follow instructions from your health care provider about how to take care of your incision. Make sure you: °? Wash your hands with soap and water before you change your bandage (dressing). If soap and water are not available, use hand sanitizer. °? Change your dressing as told by your health care provider. °? Leave stitches (sutures), skin glue, or adhesive strips in place. These skin closures may need to stay in place for 2 weeks or longer. If adhesive strip edges start to loosen and curl up, you may trim the loose edges. Do not remove adhesive strips completely unless your health care provider tells you to do that. °· Check your incision area every day for signs of infection. Check for: °? More redness, swelling, or pain. °? More fluid or blood. °? Warmth. °? Pus or a bad smell. °? Fluid leaking from around your catheter (instead of fluid draining through your catheter). °Catheter care ° °· Follow instructions from your health care provider about emptying and cleaning your catheter and collection bag. You may need to clean the catheter every day so it does not clog. °· If directed, write down the following information every time you empty your bag: °? The date and time. °? The amount of drainage. °General instructions °· Rest at home for 1-2 days after your procedure. Return to your normal activities as told by your  health care provider. °· Do not take baths, swim, or use a hot tub for 24 hours after your procedure, or until your health care provider says that this is okay. °· Take over-the-counter and prescription medicines only as told by your health care provider. °· Keep all follow-up visits as told by your health care provider. This is important. °Contact a health care provider if: °· You have less than 10 mL of drainage a day for 2-3 days in a row, or as directed by your health care provider. °· You have more redness, swelling, or pain around your incision area. °· You have more fluid or blood coming from your incision area. °· Your incision area feels warm to the touch. °· You have pus or a bad smell coming from your incision area. °· You have fluid leaking from around your catheter (instead of through your catheter). °· You have a fever or chills. °· You have pain that does not get better with medicine. °Get help right away if: °· Your catheter comes out. °· You suddenly stop having drainage from your catheter. °· You suddenly have blood in the fluid that is draining from your catheter. °· You become dizzy or you faint. °· You develop a rash. °· You have nausea or vomiting. °· You have difficulty breathing or you feel short of breath. °· You develop chest pain. °· You have problems with your speech or vision. °· You have trouble balancing or moving your arms or legs. °Summary °· It is common to have a small   amount of bruising and discomfort in the area where the drainage tube (catheter) was placed. °· You may be directed to record the amount of drainage from the bag every time you empty it. °· Follow instructions from your health care provider about emptying and cleaning your catheter and collection bag. °This information is not intended to replace advice given to you by your health care provider. Make sure you discuss any questions you have with your health care provider. °Document Released: 09/05/2013 Document  Revised: 04/03/2017 Document Reviewed: 03/13/2016 °[employer] Patient Education © 2020 [employer] ° °

## 2019-03-13 NOTE — Discharge Summary (Signed)
Physician Discharge Summary  Charles [name] ZOX:[id number] DOB: [birth year] DOA: 03/10/2019  PCP: [name], MD  Admit date: 03/10/2019  Discharge date: 03/13/2019  Admitted From:Home  Disposition:  Home  Recommendations for Outpatient Follow-up:  1. Follow up with PCP in 1-2 weeks 2. Follow-up with general surgery Dr. Arnoldo Morale as scheduled on 11/12 to manage drain and reevaluate 3. Continue on Augmentin as prescribed  Home Health: None  Equipment/Devices: None  Discharge Condition: Stable  CODE STATUS: Full  Diet recommendation: Heart Healthy/carb modified  Brief/Interim Summary: Per HPI: Charles L Dickersonis a 68 y.o.malewith medical history significant ofstage IV rectal cancer with metastasis to lung and pancreas, DVT, PE, diabetes mellitus type 2, peripheral vascular diseasewho presented to the ER with vomiting, fever. Review of records shows patient was admitted from 02/22/2019-02/26/2019 with acute appendicitis which was managed conservatively with IV antibiotics and IV fluids and patient was discharged home with 10 days of oral Augmentin. Patient calledDr. Constance Haw with general surgery in the officeand was sent into the ER due to recurrent and worsening symptoms. He says he started having nausea and vomiting today and has been having ongoing abdominal pain. Noted to have a fever spike of 102.3 on presentation.  11/6:Patient was admitted with periappendiceal abscess. General surgery is following with plans to keep patient on IV fluid as well as Zosyn. Plans for IR intervention for drainage as well. This is currently pending.  11/7: Patient has been transferred to [facility] today with plans for placement of drain for his periappendiceal abscess.  Plan to continue IV fluid and Zosyn and restart diet after procedure.  Appreciate general surgery recommendations.  11/8: Patient has had drain placed by IR for his periappendiceal abscess on 11/7 and had tolerated  the procedure well.  He is feeling much better this morning and stable for discharge.  He has been evaluated by general surgery who will follow up with him on Thursday of this week and has written a prescription for Augmentin for him to continue at this time.  No other acute events noted throughout the course of this admission.  Microbiology still pending, but Gram stain with gram-positive and gram-negative rods as well as gram-positive cocci noted.  Discharge Diagnoses:  Principal Problem:   Appendicular abscess Active Problems:   GERD   Rectal cancer ([place])   Hx of radiation therapy   Acute pulmonary embolism (HCC)   DVT (deep venous thrombosis) (HCC)   S/P partial lobectomy of lung   Hiatal hernia   Type 2 diabetes mellitus ([place])  Principal discharge diagnosis: Periappendiceal abscess in the setting of acute appendicitis status post placement of CT guided abscess drain catheter placement.  Discharge Instructions  Discharge Instructions    Diet - low sodium heart healthy   Complete by: As directed    Increase activity slowly   Complete by: As directed      Allergies as of 03/13/2019      Reactions   Oxycodone    Blisters, hallucinations   Tramadol    Blisters, hallucinations   Trazodone And Nefazodone Other (See Comments)   hallucinations      Medication List    TAKE these medications   acetaminophen 500 MG tablet Commonly known as: TYLENOL Take 1,000 mg by mouth every 6 (six) hours as needed for mild pain or moderate pain.   amoxicillin-clavulanate 875-125 MG tablet Commonly known as: AUGMENTIN Take 1 tablet by mouth every 12 (twelve) hours.   clonazePAM 1 MG tablet Commonly known  as: KLONOPIN Take 1 tablet by mouth three times daily as needed What changed: when to take this   First-Dukes Mouthwash Susp Use as directed 5 mLs in the mouth or throat 4 (four) times daily. What changed: when to take this   gabapentin 300 MG capsule Commonly known as:  NEURONTIN TAKE 3 CAPSULES BY MOUTH TWICE DAILY   glimepiride 2 MG tablet Commonly known as: AMARYL Take 2 mg by mouth 2 (two) times daily before a meal.   levofloxacin 500 MG tablet Commonly known as: LEVAQUIN Take 500 mg by mouth daily. 7 day course starting on 03/10/2019   mirtazapine 7.5 MG tablet Commonly known as: REMERON Take 7.5 mg by mouth at bedtime.   ondansetron 8 MG tablet Commonly known as: Zofran Take 1 tablet (8 mg total) by mouth 2 (two) times daily as needed for refractory nausea / vomiting. Start on day 3 after chemotherapy.   pantoprazole 40 MG tablet Commonly known as: PROTONIX Take 40 mg by mouth every evening.   PROBIOTIC DAILY PO Take 1 capsule by mouth 2 (two) times daily.   rivaroxaban 20 MG Tabs tablet Commonly known as: XARELTO Take 1 tablet (20 mg total) by mouth daily with supper.   vitamin B-12 1000 MCG tablet Commonly known as: CYANOCOBALAMIN Take 1,000 mcg by mouth daily.      Follow-up Information    Aviva Signs, MD. Call on 03/17/2019.   Specialty: General Surgery Contact information: [address] [place] [address] [phone]        [name], MD Follow up in 1 week(s).   Specialty: Internal Medicine Contact information: [place] [address] [phone]          Allergies  Allergen Reactions  . Oxycodone     Blisters, hallucinations  . Tramadol     Blisters, hallucinations  . Trazodone And Nefazodone Other (See Comments)    hallucinations    Consultations:  General surgery  IR   Procedures/Studies: Ct Chest W Contrast  Result Date: 02/22/2019 CLINICAL DATA:  Nausea vomiting diarrhea history of rectal cancer with metastatic disease EXAM: CT CHEST, ABDOMEN, AND PELVIS WITH CONTRAST TECHNIQUE: Multidetector CT imaging of the chest, abdomen and pelvis was performed following the standard protocol during bolus administration of intravenous contrast. CONTRAST:  164mL OMNIPAQUE  IOHEXOL 300 MG/ML  SOLN COMPARISON:  CT 09/22/2018, 07/30/2018, 05/28/2018 FINDINGS: CT CHEST FINDINGS Cardiovascular: Nonaneurysmal aorta. Mild aortic atherosclerosis. Coronary vascular calcification. Normal heart size. No pericardial effusion. Right-sided central venous port with tip at the distal SVC. Mediastinum/Nodes: Midline trachea. No thyroid mass. No increasing mediastinal adenopathy. Esophagus within normal limits. Lungs/Pleura: No acute consolidation or pleural effusion. Stable 6 mm anterior right upper lobe pulmonary nodule, series 3, image number 106, previously 6 mm. Posterior right upper lobe pulmonary nodule, series 3, image number 41 measures 11 mm, previously 11 mm. Posterior right upper lobe 11 mm pulmonary nodule, series 3, image number 75, previously 13 mm. No new pulmonary nodule. Right middle lobe postsurgical changes Musculoskeletal: Stable mixed sclerotic and lucent lesion in the lower sternum. Degenerative changes of the spine. CT ABDOMEN PELVIS FINDINGS Hepatobiliary: Contracted gallbladder without calcified stone. No focal hepatic abnormality. No ductal dilatation Pancreas: Diffusely atrophic. Diffuse pancreatic duct dilatation with truncation at the pancreatic head. Stable 2.1 cm mass at the head of the pancreas. Spleen: Normal in size without focal abnormality. Adrenals/Urinary Tract: Left adrenal calcification. No adrenal mass. Kidneys show no hydronephrosis. Stable subcentimeter exophytic slightly dense lesion  off the lower pole left kidney. The bladder is slightly thick walled. Stomach/Bowel: The stomach is nonenlarged. No dilated small bowel. Status post partial proctocolectomy decreased rectal wall thickening compared to most recent prior. Interval left lower quadrant loop colostomy. Abnormal appendix. Small appendicoliths. Considerable periappendiceal inflammatory change. Appendix tip dilated up to 2.1 cm. No extraluminal gas. Possible tiny 7 mm fluid collection adjacent to tip of  appendix. Vascular/Lymphatic: IVC filter with tip just below the renal vein confluence. Similar protrusion struts beyond the IVC lumen. Nonaneurysmal aorta with mild aortic atherosclerosis. Subcentimeter lymph nodes in the right lower quadrant could be reactive. Reproductive: Prostate calcification Other: Mild presacral soft tissue stranding. No free fluid. No significant pelvic effusion Musculoskeletal: No acute or suspicious osseous abnormality IMPRESSION: 1. Findings consistent with acute appendicitis. No definitive extraluminal gas though considerable inflammatory change about the appendix and possible tiny 7 mm tiny fluid collection adjacent to the tip of the appendix. Appendix: Location: Right lower quadrant Diameter: Up to 21 mm Appendicolith: Positive for stone near the tip Mucosal hyper-enhancement: Negative Extraluminal gas: Negative Periappendiceal collection: Possible tiny 7 mm fluid collection adjacent to the tip of the appendix. 2. Status post partial proctocolectomy with interval left lower quadrant loop colostomy. Decreased rectal wall thickening. 3.  Stable right upper lobe pulmonary nodules. 4. Stable 2 cm mass at the head of pancreas with atrophy ductal obstruction distal to this 5. Stable mixed lucent and sclerotic lesion involving the lower sternum Electronically Signed   By: Donavan Foil M.D.   On: 02/22/2019 21:40   Ct Abdomen Pelvis W Contrast  Result Date: 03/10/2019 CLINICAL DATA:  Right lower quadrant pain history of appendicitis without surgery history of rectal cancer lung [place] EXAM: CT ABDOMEN AND PELVIS WITH CONTRAST TECHNIQUE: Multidetector CT imaging of the abdomen and pelvis was performed using the standard protocol following bolus administration of intravenous contrast. CONTRAST:  187mL OMNIPAQUE IOHEXOL 300 MG/ML  SOLN COMPARISON:  CT 02/22/2019, 09/22/2018, MRI 09/10/2018, CT 07/30/2018 FINDINGS: Lower chest: Lung bases demonstrate no acute consolidation or effusion. Stable  scarring and nodularity within the posterior right lung base and within the right middle lobe. Stable 7 mm anterior right upper lobe lung nodule. Heart size is normal Hepatobiliary: No focal hepatic abnormality. No calcified gallstone. Mild wall thickening of the gallbladder fundus. Pancreas: Diffusely atrophic pancreas without inflammatory change. Hypoenhancing mass at the posterior head of the pancreas measuring 2.1 by 2 cm, grossly stable. Spleen: Normal in size without focal abnormality. Adrenals/Urinary Tract: Adrenal glands are within normal limits aside from left adrenal calcification. Kidneys show no hydronephrosis. 9 mm exophytic solid-appearing mass lower pole left kidney. Stomach/Bowel: The stomach is nonenlarged. No dilated small bowel. Left abdominal loop colostomy. Rectal anastomosis with mild rectal wall thickening, similar compared to most recent prior. Enlarged appendix measuring up to 16 mm. Persistent considerable inflammatory change about the appendix. Irregular rim enhancing fluid collection in the right lower quadrant measuring 3.9 by 3.4 by 3.8 cm consistent with periappendiceal abscess and perforated appendix. Appendicoliths within the rim enhancing fluid collection. Vascular/Lymphatic: Moderate aortic atherosclerosis. Mild aortic atherosclerosis without aneurysm. Subcentimeter right lower quadrant lymph nodes. IVC filter tip just below the level of renal vein confluence. Protrusion of filter struts beyond the IVC luminal margin. Reproductive: Prostate calcification Other: Presacral soft tissue stranding and small fluid.  No free air Musculoskeletal: Irregular sclerotic and lucent lesion of the inferior sternum as before. No new osseous abnormality IMPRESSION: 1. Continued considerable inflammatory changes in the right lower quadrant  surrounding an abnormal appendix. Interval perforation of the appendix with new irregular rim enhancing fluid collection measuring up to 3.9 cm, compatible with  periappendiceal abscess. There is an appendicoliths in the abscess fluid collection. There is no extraluminal gas or free air. 2. Status post partial proctocolectomy with left lower quadrant loop colostomy. Mild residual rectal wall thickening 3. Stable diffuse atrophy of the pancreas with ductal dilatation and 2.2 cm obstructing mass at the posterior head of the pancreas 4. Stable 9 mm solid appearing exophytic lesion lower pole left kidney 5. Stable lucent and sclerotic lesion involving the lower sternum Electronically Signed   By: Donavan Foil M.D.   On: 03/10/2019 19:13   Ct Abdomen Pelvis W Contrast  Result Date: 02/22/2019 CLINICAL DATA:  Nausea vomiting diarrhea history of rectal cancer with metastatic disease EXAM: CT CHEST, ABDOMEN, AND PELVIS WITH CONTRAST TECHNIQUE: Multidetector CT imaging of the chest, abdomen and pelvis was performed following the standard protocol during bolus administration of intravenous contrast. CONTRAST:  126mL OMNIPAQUE IOHEXOL 300 MG/ML  SOLN COMPARISON:  CT 09/22/2018, 07/30/2018, 05/28/2018 FINDINGS: CT CHEST FINDINGS Cardiovascular: Nonaneurysmal aorta. Mild aortic atherosclerosis. Coronary vascular calcification. Normal heart size. No pericardial effusion. Right-sided central venous port with tip at the distal SVC. Mediastinum/Nodes: Midline trachea. No thyroid mass. No increasing mediastinal adenopathy. Esophagus within normal limits. Lungs/Pleura: No acute consolidation or pleural effusion. Stable 6 mm anterior right upper lobe pulmonary nodule, series 3, image number 106, previously 6 mm. Posterior right upper lobe pulmonary nodule, series 3, image number 41 measures 11 mm, previously 11 mm. Posterior right upper lobe 11 mm pulmonary nodule, series 3, image number 75, previously 13 mm. No new pulmonary nodule. Right middle lobe postsurgical changes Musculoskeletal: Stable mixed sclerotic and lucent lesion in the lower sternum. Degenerative changes of the spine. CT  ABDOMEN PELVIS FINDINGS Hepatobiliary: Contracted gallbladder without calcified stone. No focal hepatic abnormality. No ductal dilatation Pancreas: Diffusely atrophic. Diffuse pancreatic duct dilatation with truncation at the pancreatic head. Stable 2.1 cm mass at the head of the pancreas. Spleen: Normal in size without focal abnormality. Adrenals/Urinary Tract: Left adrenal calcification. No adrenal mass. Kidneys show no hydronephrosis. Stable subcentimeter exophytic slightly dense lesion off the lower pole left kidney. The bladder is slightly thick walled. Stomach/Bowel: The stomach is nonenlarged. No dilated small bowel. Status post partial proctocolectomy decreased rectal wall thickening compared to most recent prior. Interval left lower quadrant loop colostomy. Abnormal appendix. Small appendicoliths. Considerable periappendiceal inflammatory change. Appendix tip dilated up to 2.1 cm. No extraluminal gas. Possible tiny 7 mm fluid collection adjacent to tip of appendix. Vascular/Lymphatic: IVC filter with tip just below the renal vein confluence. Similar protrusion struts beyond the IVC lumen. Nonaneurysmal aorta with mild aortic atherosclerosis. Subcentimeter lymph nodes in the right lower quadrant could be reactive. Reproductive: Prostate calcification Other: Mild presacral soft tissue stranding. No free fluid. No significant pelvic effusion Musculoskeletal: No acute or suspicious osseous abnormality IMPRESSION: 1. Findings consistent with acute appendicitis. No definitive extraluminal gas though considerable inflammatory change about the appendix and possible tiny 7 mm tiny fluid collection adjacent to the tip of the appendix. Appendix: Location: Right lower quadrant Diameter: Up to 21 mm Appendicolith: Positive for stone near the tip Mucosal hyper-enhancement: Negative Extraluminal gas: Negative Periappendiceal collection: Possible tiny 7 mm fluid collection adjacent to the tip of the appendix. 2. Status  post partial proctocolectomy with interval left lower quadrant loop colostomy. Decreased rectal wall thickening. 3.  Stable right upper lobe pulmonary nodules.  4. Stable 2 cm mass at the head of pancreas with atrophy ductal obstruction distal to this 5. Stable mixed lucent and sclerotic lesion involving the lower sternum Electronically Signed   By: Donavan Foil M.D.   On: 02/22/2019 21:40   Ct Image Guided Drainage By Percutaneous Catheter  Result Date: 03/12/2019 CLINICAL DATA:  Periappendiceal abscess EXAM: CT GUIDED DRAINAGE OF PERIAPPENDICEAL ABSCESS ANESTHESIA/SEDATION: Intravenous Fentanyl 275mcg and Versed 4mg  were administered as conscious sedation during continuous monitoring of the patient's level of consciousness and physiological / cardiorespiratory status by the radiology RN, with a total moderate sedation time of 16 minutes. PROCEDURE: The procedure, risks, benefits, and alternatives were explained to the patient. Questions regarding the procedure were encouraged and answered. The patient understands and consents to the procedure. Select axial scans through the abdomen were obtained. The periappendiceal collection was localized and an appropriate skin entry site was determined and marked. The operative field was prepped with chlorhexidinein a sterile fashion, and a sterile drape was applied covering the operative field. A sterile gown and sterile gloves were used for the procedure. Local anesthesia was provided with 1% Lidocaine. Under CT fluoroscopic guidance, 18 gauge percutaneous entry needle advanced into the collection. Purulent material could be aspirated. Amplatz wire advanced easily within the collection, its position confirmed on CT. Tract dilated to facilitate placement of a 10 French pigtail drain catheter, formed centrally within the collection. 25 mL of purulent material were aspirated, sent for Gram stain and culture. The catheter was secured externally with 0 Prolene suture and  StatLock and placed to gravity drain bag. The patient tolerated the procedure well. COMPLICATIONS: None immediate FINDINGS: Periappendiceal collection and appendicoliths identified. 10 French pigtail drain catheter placed as above. 25 mL purulent aspirate sent for Gram stain and culture. IMPRESSION: 1. Technically successful CT-guided periappendiceal abscess drain catheter placement. Electronically Signed   By: Lucrezia Europe M.D.   On: 03/12/2019 12:01     Discharge Exam: Vitals:   03/12/19 2131 03/13/19 0550  BP: 119/74 109/72  Pulse: (!) 112 (!) 102  Resp: 16 16  Temp: 98.4 F (36.9 C) 98.6 F (37 C)  SpO2: 97% 99%   Vitals:   03/12/19 1050 03/12/19 1225 03/12/19 2131 03/13/19 0550  BP: 135/81 125/78 119/74 109/72  Pulse: (!) 117 (!) 113 (!) 112 (!) 102  Resp: 18 16 16 16   Temp:  98.7 F (37.1 C) 98.4 F (36.9 C) 98.6 F (37 C)  TempSrc:  Oral Oral Oral  SpO2: 97% 100% 97% 99%  Weight:    74.2 kg  Height:        General: Pt is alert, awake, not in acute distress Cardiovascular: RRR, S1/S2 +, no rubs, no gallops Respiratory: CTA bilaterally, no wheezing, no rhonchi Abdominal: Soft, NT, ND, bowel sounds + colostomy bag with stool output noted as well as drain with dressings clean dry and intact Extremities: no edema, no cyanosis    The results of significant diagnostics from this hospitalization (including imaging, microbiology, ancillary and laboratory) are listed below for reference.     Microbiology: Recent Results (from the past 240 hour(s))  Culture, blood (routine x 2)     Status: None (Preliminary result)   Collection Time: 03/10/19  5:17 PM   Specimen: Right Antecubital; Blood  Result Value Ref Range Status   Specimen Description RIGHT ANTECUBITAL  Final   Special Requests   Final    BOTTLES DRAWN AEROBIC ONLY Blood Culture adequate volume   Culture  Final    NO GROWTH 2 DAYS Performed at [facility], [address] [place], [place] [address]    Report  Status PENDING  Incomplete  Culture, blood (routine x 2)     Status: None (Preliminary result)   Collection Time: 03/10/19  5:18 PM   Specimen: Left Antecubital; Blood  Result Value Ref Range Status   Specimen Description LEFT ANTECUBITAL  Final   Special Requests   Final    BOTTLES DRAWN AEROBIC AND ANAEROBIC Blood Culture adequate volume   Culture   Final    NO GROWTH 2 DAYS Performed at [facility], [address], [place], [place] [address]    Report Status PENDING  Incomplete  SARS CORONAVIRUS 2 (TAT 6-24 HRS)     Status: None   Collection Time: 03/10/19  9:15 PM  Result Value Ref Range Status   SARS Coronavirus 2 NEGATIVE NEGATIVE Final    Comment: (NOTE) SARS-CoV-2 target nucleic acids are NOT DETECTED. The SARS-CoV-2 RNA is generally detectable in upper and lower respiratory specimens during the acute phase of infection. Negative results do not preclude SARS-CoV-2 infection, do not rule out co-infections with other pathogens, and should not be used as the sole basis for treatment or other patient management decisions. Negative results must be combined with clinical observations, patient history, and epidemiological information. The expected result is Negative. Fact Sheet for Patients: SugarRoll.be Fact Sheet for Healthcare Providers: https://www.woods-mathews.com/ This test is not yet approved or cleared by the [place] FDA and  has been authorized for detection and/or diagnosis of SARS-CoV-2 by FDA under an Emergency Use Authorization (EUA). This EUA will remain  in effect (meaning this test can be used) for the duration of the COVID-19 declaration under Section 56 4(b)(1) of the Act, 21 U.S.C. section 360bbb-3(b)(1), unless the authorization is terminated or revoked sooner. Performed at [facility], [place] [address] [place], [place] [address]   Surgical PCR screen     Status: Abnormal   Collection Time: 03/11/19  11:59 AM   Specimen: Nasal Mucosa; Nasal Swab  Result Value Ref Range Status   MRSA, PCR NEGATIVE NEGATIVE Final   Staphylococcus aureus POSITIVE (A) NEGATIVE Final    Comment: (NOTE) The Xpert SA Assay (FDA approved for NASAL specimens in patients 68 years of age and older), is one component of a comprehensive surveillance program. It is not intended to diagnose infection nor to guide or monitor treatment. Performed at [facility], [address], [place], [place] [address]   Aerobic/Anaerobic Culture (surgical/deep wound)     Status: None (Preliminary result)   Collection Time: 03/12/19 10:42 AM   Specimen: Abscess  Result Value Ref Range Status   Specimen Description ABSCESS RLQ DRAIN  Final   Special Requests Normal  Final   Gram Stain   Final    RARE WBC PRESENT,BOTH PMN AND MONONUCLEAR RARE GRAM POSITIVE COCCI IN PAIRS RARE GRAM NEGATIVE RODS RARE GRAM POSITIVE RODS    Culture   Final    FEW GRAM NEGATIVE RODS CULTURE REINCUBATED FOR BETTER GROWTH Performed at [facility], [place] [address] [place], [place] [address]    Report Status PENDING  Incomplete     Labs: BNP (last 3 results) Recent Labs    09/22/18 1529  BNP 96.2   Basic Metabolic Panel: Recent Labs  Lab 03/10/19 1559 03/11/19 0531 03/12/19 0650  NA 129* 133* 134*  K 4.2 4.1 3.9  CL 95* 101 99  CO2 24  22 21*  GLUCOSE 253* 124* 142*  BUN 15 10 11   CREATININE 1.11 0.78 0.87  CALCIUM 8.4* 8.4* 8.5*   Liver Function Tests: Recent Labs  Lab 03/10/19 1559  AST 17  ALT 15  ALKPHOS 72  BILITOT 1.3*  PROT 7.0  ALBUMIN 2.9*   Recent Labs  Lab 03/10/19 1559  LIPASE 16   No results for input(s): AMMONIA in the last 168 hours. CBC: Recent Labs  Lab 03/10/19 1559 03/11/19 0531 03/12/19 0650 03/13/19 0919  WBC 16.2* 16.4* 16.8* 11.3*  NEUTROABS 13.2*  --   --   --   HGB 11.0* 9.9* 10.7* 9.4*  HCT 35.7* 32.5* 35.0* 31.1*  MCV 91.3 90.8 90.7 90.9  PLT 421* 360 375 297   Cardiac  Enzymes: No results for input(s): CKTOTAL, CKMB, CKMBINDEX, TROPONINI in the last 168 hours. BNP: Invalid input(s): POCBNP CBG: Recent Labs  Lab 03/12/19 1212 03/12/19 1622 03/12/19 2005 03/13/19 0032 03/13/19 0726  GLUCAP 122* 149* 229* 124* 127*   D-Dimer No results for input(s): DDIMER in the last 72 hours. Hgb A1c No results for input(s): HGBA1C in the last 72 hours. Lipid Profile No results for input(s): CHOL, HDL, LDLCALC, TRIG, CHOLHDL, LDLDIRECT in the last 72 hours. Thyroid function studies No results for input(s): TSH, T4TOTAL, T3FREE, THYROIDAB in the last 72 hours.  Invalid input(s): FREET3 Anemia work up No results for input(s): VITAMINB12, FOLATE, FERRITIN, TIBC, IRON, RETICCTPCT in the last 72 hours. Urinalysis    Component Value Date/Time   COLORURINE AMBER (A) 03/10/2019 1548   APPEARANCEUR HAZY (A) 03/10/2019 1548   LABSPEC 1.024 03/10/2019 1548   PHURINE 5.0 03/10/2019 1548   GLUCOSEU 50 (A) 03/10/2019 1548   HGBUR NEGATIVE 03/10/2019 [place] 03/10/2019 1548   KETONESUR NEGATIVE 03/10/2019 1548   PROTEINUR 100 (A) 03/10/2019 1548   UROBILINOGEN 1.0 11/14/2014 1504   NITRITE NEGATIVE 03/10/2019 1548   LEUKOCYTESUR NEGATIVE 03/10/2019 1548   Sepsis Labs Invalid input(s): PROCALCITONIN,  WBC,  LACTICIDVEN Microbiology Recent Results (from the past 240 hour(s))  Culture, blood (routine x 2)     Status: None (Preliminary result)   Collection Time: 03/10/19  5:17 PM   Specimen: Right Antecubital; Blood  Result Value Ref Range Status   Specimen Description RIGHT ANTECUBITAL  Final   Special Requests   Final    BOTTLES DRAWN AEROBIC ONLY Blood Culture adequate volume   Culture   Final    NO GROWTH 2 DAYS Performed at [facility], [address], [place], [place] [address]    Report Status PENDING  Incomplete  Culture, blood (routine x 2)     Status: None (Preliminary result)   Collection Time: 03/10/19  5:18 PM   Specimen:  Left Antecubital; Blood  Result Value Ref Range Status   Specimen Description LEFT ANTECUBITAL  Final   Special Requests   Final    BOTTLES DRAWN AEROBIC AND ANAEROBIC Blood Culture adequate volume   Culture   Final    NO GROWTH 2 DAYS Performed at [facility], [address] [place], [place] [address]    Report Status PENDING  Incomplete  SARS CORONAVIRUS 2 (TAT 6-24 HRS)     Status: None   Collection Time: 03/10/19  9:15 PM  Result Value Ref Range Status   SARS Coronavirus 2 NEGATIVE NEGATIVE Final    Comment: (NOTE) SARS-CoV-2 target nucleic acids are NOT DETECTED. The SARS-CoV-2 RNA is generally detectable in upper and lower respiratory specimens  during the acute phase of infection. Negative results do not preclude SARS-CoV-2 infection, do not rule out co-infections with other pathogens, and should not be used as the sole basis for treatment or other patient management decisions. Negative results must be combined with clinical observations, patient history, and epidemiological information. The expected result is Negative. Fact Sheet for Patients: SugarRoll.be Fact Sheet for Healthcare Providers: https://www.woods-mathews.com/ This test is not yet approved or cleared by the [place] FDA and  has been authorized for detection and/or diagnosis of SARS-CoV-2 by FDA under an Emergency Use Authorization (EUA). This EUA will remain  in effect (meaning this test can be used) for the duration of the COVID-19 declaration under Section 56 4(b)(1) of the Act, 21 U.S.C. section 360bbb-3(b)(1), unless the authorization is terminated or revoked sooner. Performed at [facility], [place] [address], [place], [place] [address]   Surgical PCR screen     Status: Abnormal   Collection Time: 03/11/19 11:59 AM   Specimen: Nasal Mucosa; Nasal Swab  Result Value Ref Range Status   MRSA, PCR NEGATIVE NEGATIVE Final   Staphylococcus aureus  POSITIVE (A) NEGATIVE Final    Comment: (NOTE) The Xpert SA Assay (FDA approved for NASAL specimens in patients 47 years of age and older), is one component of a comprehensive surveillance program. It is not intended to diagnose infection nor to guide or monitor treatment. Performed at [facility], [address] [place], [place] [address]   Aerobic/Anaerobic Culture (surgical/deep wound)     Status: None (Preliminary result)   Collection Time: 03/12/19 10:42 AM   Specimen: Abscess  Result Value Ref Range Status   Specimen Description ABSCESS RLQ DRAIN  Final   Special Requests Normal  Final   Gram Stain   Final    RARE WBC PRESENT,BOTH PMN AND MONONUCLEAR RARE GRAM POSITIVE COCCI IN PAIRS RARE GRAM NEGATIVE RODS RARE GRAM POSITIVE RODS    Culture   Final    FEW GRAM NEGATIVE RODS CULTURE REINCUBATED FOR BETTER GROWTH Performed at [facility], [place] [address] [place], [place] [address]    Report Status PENDING  Incomplete     Time coordinating discharge: 35 minutes  SIGNED:   Rodena Goldmann, DO [facility] 03/13/2019, 9:53 AM  If 7PM-7AM, please contact night-coverage [email or web address]

## 2019-03-13 NOTE — Progress Notes (Signed)
Subjective: Successful placement of percutaneous drain tube yesterday.  Patient feels fine.  Objective: Vital signs in last 24 hours: Temp:  [98.4 F (36.9 C)-98.7 F (37.1 C)] 98.6 F (37 C) (11/08 0550) Pulse Rate:  [102-124] 102 (11/08 0550) Resp:  [16-22] 16 (11/08 0550) BP: (109-136)/(70-81) 109/72 (11/08 0550) SpO2:  [95 %-100 %] 99 % (11/08 0550) Weight:  [74.2 kg] 74.2 kg (11/08 0550) Last BM Date: 03/12/19  Intake/Output from previous day: 11/07 0701 - 11/08 0700 In: 480 [P.O.:480] Out: 410 [Urine:400; Drains:10] Intake/Output this shift: Total I/O In: -  Out: 150 [Stool:150]  General appearance: alert, cooperative and no distress GI: Soft, flat.  Pigtail catheter in place with blood-tinged serous fluid present.  Lab Results:  Recent Labs    03/11/19 0531 03/12/19 0650  WBC 16.4* 16.8*  HGB 9.9* 10.7*  HCT 32.5* 35.0*  PLT 360 375   BMET Recent Labs    03/11/19 0531 03/12/19 0650  NA 133* 134*  K 4.1 3.9  CL 101 99  CO2 22 21*  GLUCOSE 124* 142*  BUN 10 11  CREATININE 0.78 0.87  CALCIUM 8.4* 8.5*   PT/INR Recent Labs    03/11/19 0835 03/12/19 0650  LABPROT 17.8* 16.7*  INR 1.5* 1.4*    Studies/Results: Ct Image Guided Drainage By Percutaneous Catheter  Result Date: 03/12/2019 CLINICAL DATA:  Periappendiceal abscess EXAM: CT GUIDED DRAINAGE OF PERIAPPENDICEAL ABSCESS ANESTHESIA/SEDATION: Intravenous Fentanyl 219mcg and Versed 4mg  were administered as conscious sedation during continuous monitoring of the patient's level of consciousness and physiological / cardiorespiratory status by the radiology RN, with a total moderate sedation time of 16 minutes. PROCEDURE: The procedure, risks, benefits, and alternatives were explained to the patient. Questions regarding the procedure were encouraged and answered. The patient understands and consents to the procedure. Select axial scans through the abdomen were obtained. The periappendiceal collection  was localized and an appropriate skin entry site was determined and marked. The operative field was prepped with chlorhexidinein a sterile fashion, and a sterile drape was applied covering the operative field. A sterile gown and sterile gloves were used for the procedure. Local anesthesia was provided with 1% Lidocaine. Under CT fluoroscopic guidance, 18 gauge percutaneous entry needle advanced into the collection. Purulent material could be aspirated. Amplatz wire advanced easily within the collection, its position confirmed on CT. Tract dilated to facilitate placement of a 10 French pigtail drain catheter, formed centrally within the collection. 25 mL of purulent material were aspirated, sent for Gram stain and culture. The catheter was secured externally with 0 Prolene suture and StatLock and placed to gravity drain bag. The patient tolerated the procedure well. COMPLICATIONS: None immediate FINDINGS: Periappendiceal collection and appendicoliths identified. 10 French pigtail drain catheter placed as above. 25 mL purulent aspirate sent for Gram stain and culture. IMPRESSION: 1. Technically successful CT-guided periappendiceal abscess drain catheter placement. Electronically Signed   By: Lucrezia Europe M.D.   On: 03/12/2019 12:01    Anti-infectives: Anti-infectives (From admission, onward)   Start     Dose/Rate Route Frequency Ordered Stop   03/11/19 1400  piperacillin-tazobactam (ZOSYN) IVPB 3.375 g     3.375 g 12.5 mL/hr over 240 Minutes Intravenous Every 8 hours 03/11/19 1143     03/11/19 0000  piperacillin-tazobactam (ZOSYN) IVPB 3.375 g  Status:  Discontinued     3.375 g 100 mL/hr over 30 Minutes Intravenous Every 6 hours 03/10/19 2311 03/11/19 1143   03/10/19 1730  piperacillin-tazobactam (ZOSYN) IVPB 3.375 g  3.375 g 100 mL/hr over 30 Minutes Intravenous  Once 03/10/19 1723 03/10/19 1952      Assessment/Plan: Impression: Successful catheter placement for periappendiceal  abscess/perforation. Plan: Okay for discharge from surgery standpoint.  I will see the patient later this week and manage his catheter.  I have written for Augmentin.  LOS: 3 days    Aviva Signs 03/13/2019

## 2019-03-13 NOTE — Progress Notes (Signed)
IV removed, 2x2 gauze applied to site, patient tolerated well.  Reviewed AVS with patient and patient's wife, both verbalized understanding.  Patient taken to lobby via wheelchair and transported home by his wife.

## 2019-03-14 ENCOUNTER — Other Ambulatory Visit (HOSPITAL_COMMUNITY): Payer: Medicare Other

## 2019-03-14 ENCOUNTER — Ambulatory Visit (HOSPITAL_COMMUNITY): Payer: Medicare Other

## 2019-03-14 ENCOUNTER — Ambulatory Visit (HOSPITAL_COMMUNITY): Payer: Medicare Other | Admitting: Hematology

## 2019-03-15 LAB — CULTURE, BLOOD (ROUTINE X 2)
Culture: NO GROWTH
Culture: NO GROWTH
Special Requests: ADEQUATE
Special Requests: ADEQUATE

## 2019-03-15 LAB — AEROBIC/ANAEROBIC CULTURE W GRAM STAIN (SURGICAL/DEEP WOUND): Special Requests: NORMAL

## 2019-03-16 ENCOUNTER — Encounter (HOSPITAL_COMMUNITY): Payer: Medicare Other

## 2019-03-17 ENCOUNTER — Encounter: Payer: Self-pay | Admitting: General Surgery

## 2019-03-17 ENCOUNTER — Ambulatory Visit (INDEPENDENT_AMBULATORY_CARE_PROVIDER_SITE_OTHER): Payer: Medicare Other | Admitting: General Surgery

## 2019-03-17 ENCOUNTER — Other Ambulatory Visit: Payer: Self-pay

## 2019-03-17 VITALS — BP 129/81 | HR 113 | Resp 18 | Ht 74.0 in | Wt 164.0 lb

## 2019-03-17 DIAGNOSIS — K3532 Acute appendicitis with perforation and localized peritonitis, without abscess: Secondary | ICD-10-CM

## 2019-03-17 NOTE — Progress Notes (Signed)
Subjective:     Charles [name]  Here for follow-up status post percutaneous drainage of perforated appendicitis.  Patient states he is doing well.  Denies any fevers.  He is still on antibiotics.  Drainage has been minimal daily.  He is not flushing the pigtail catheter. Objective:    BP 129/81 (BP Location: Left Arm, Patient Position: Sitting, Cuff Size: Normal)   Pulse (!) 113   Resp 18   Ht 6\' 2"  (1.88 m)   Wt 164 lb (74.4 kg)   SpO2 94%   BMI 21.06 kg/m   General:  alert, cooperative and no distress  Abdomen soft, pigtail catheter in place right lower quadrant.  Minimal less than 5 cc of blood present with a question of minimal stool.  It was flushed with 10 cc of saline.     Assessment:    Doing well, status post percutaneous drain placement for perforated appendicitis.  There was independent with present within the abscess cavity.  This may be what I am seeing in the catheter.    Plan:   Continue pigtail catheter drainage.  We will see patient again in 5 days for follow-up.

## 2019-03-22 ENCOUNTER — Other Ambulatory Visit: Payer: Self-pay

## 2019-03-22 ENCOUNTER — Ambulatory Visit (INDEPENDENT_AMBULATORY_CARE_PROVIDER_SITE_OTHER): Payer: Medicare Other | Admitting: General Surgery

## 2019-03-22 ENCOUNTER — Encounter: Payer: Self-pay | Admitting: General Surgery

## 2019-03-22 VITALS — BP 124/85 | HR 102 | Temp 97.6°F | Resp 16 | Ht 74.0 in | Wt 163.0 lb

## 2019-03-22 DIAGNOSIS — K3532 Acute appendicitis with perforation and localized peritonitis, without abscess: Secondary | ICD-10-CM | POA: Diagnosis not present

## 2019-03-22 NOTE — Progress Notes (Signed)
Subjective:     Charles [name]  Here for follow-up drain check.  Patient has been feeling well.  Denies any fevers.  No drainage has been noted in his bag since Saturday which was 3 days ago. Objective:    BP 124/85 (BP Location: Left Arm, Patient Position: Sitting, Cuff Size: Normal)   Pulse (!) 102   Temp 97.6 F (36.4 C) (Oral)   Resp 16   Ht 6\' 2"  (1.88 m)   Wt 163 lb (73.9 kg)   SpO2 95%   BMI 20.93 kg/m   General:  alert, cooperative and no distress  Abdomen soft.  Pigtail catheter removed.     Assessment:    Status post perforated appendicitis, pigtail catheter removed.    Plan:   I will see patient again in 2 weeks for follow-up.  Should he develop significant right lower quadrant abdominal pain, fevers, chills, he was instructed to let us know or go into the emergency room.  The next step would be exploratory laparotomy.  The patient and his wife both understand.

## 2019-03-23 ENCOUNTER — Other Ambulatory Visit (HOSPITAL_COMMUNITY): Payer: Self-pay

## 2019-03-23 DIAGNOSIS — C20 Malignant neoplasm of rectum: Secondary | ICD-10-CM

## 2019-03-23 DIAGNOSIS — C189 Malignant neoplasm of colon, unspecified: Secondary | ICD-10-CM

## 2019-03-23 DIAGNOSIS — C7800 Secondary malignant neoplasm of unspecified lung: Secondary | ICD-10-CM

## 2019-03-24 ENCOUNTER — Other Ambulatory Visit: Payer: Self-pay

## 2019-03-24 ENCOUNTER — Inpatient Hospital Stay (HOSPITAL_COMMUNITY): Payer: Medicare Other

## 2019-03-24 ENCOUNTER — Inpatient Hospital Stay (HOSPITAL_COMMUNITY): Payer: Medicare Other | Attending: Hematology | Admitting: Hematology

## 2019-03-24 ENCOUNTER — Encounter (HOSPITAL_COMMUNITY): Payer: Self-pay | Admitting: Hematology

## 2019-03-24 DIAGNOSIS — C20 Malignant neoplasm of rectum: Secondary | ICD-10-CM

## 2019-03-24 DIAGNOSIS — E119 Type 2 diabetes mellitus without complications: Secondary | ICD-10-CM | POA: Insufficient documentation

## 2019-03-24 DIAGNOSIS — C189 Malignant neoplasm of colon, unspecified: Secondary | ICD-10-CM

## 2019-03-24 DIAGNOSIS — I10 Essential (primary) hypertension: Secondary | ICD-10-CM | POA: Diagnosis not present

## 2019-03-24 DIAGNOSIS — C7800 Secondary malignant neoplasm of unspecified lung: Secondary | ICD-10-CM

## 2019-03-24 DIAGNOSIS — I2510 Atherosclerotic heart disease of native coronary artery without angina pectoris: Secondary | ICD-10-CM | POA: Diagnosis not present

## 2019-03-24 LAB — CBC WITH DIFFERENTIAL/PLATELET
Abs Immature Granulocytes: 0.04 10*3/uL (ref 0.00–0.07)
Basophils Absolute: 0 10*3/uL (ref 0.0–0.1)
Basophils Relative: 1 %
Eosinophils Absolute: 0.5 10*3/uL (ref 0.0–0.5)
Eosinophils Relative: 6 %
HCT: 37.3 % — ABNORMAL LOW (ref 39.0–52.0)
Hemoglobin: 11 g/dL — ABNORMAL LOW (ref 13.0–17.0)
Immature Granulocytes: 1 %
Lymphocytes Relative: 17 %
Lymphs Abs: 1.5 10*3/uL (ref 0.7–4.0)
MCH: 27.4 pg (ref 26.0–34.0)
MCHC: 29.5 g/dL — ABNORMAL LOW (ref 30.0–36.0)
MCV: 93 fL (ref 80.0–100.0)
Monocytes Absolute: 0.4 10*3/uL (ref 0.1–1.0)
Monocytes Relative: 5 %
Neutro Abs: 6.1 10*3/uL (ref 1.7–7.7)
Neutrophils Relative %: 70 %
Platelets: 306 10*3/uL (ref 150–400)
RBC: 4.01 MIL/uL — ABNORMAL LOW (ref 4.22–5.81)
RDW: 15.2 % (ref 11.5–15.5)
WBC: 8.7 10*3/uL (ref 4.0–10.5)
nRBC: 0 % (ref 0.0–0.2)

## 2019-03-24 LAB — COMPREHENSIVE METABOLIC PANEL
ALT: 13 U/L (ref 0–44)
AST: 16 U/L (ref 15–41)
Albumin: 2.8 g/dL — ABNORMAL LOW (ref 3.5–5.0)
Alkaline Phosphatase: 81 U/L (ref 38–126)
Anion gap: 10 (ref 5–15)
BUN: 10 mg/dL (ref 8–23)
CO2: 26 mmol/L (ref 22–32)
Calcium: 9.1 mg/dL (ref 8.9–10.3)
Chloride: 102 mmol/L (ref 98–111)
Creatinine, Ser: 0.71 mg/dL (ref 0.61–1.24)
GFR calc Af Amer: >60 mL/min (ref >60)
GFR calc non Af Amer: >60 mL/min (ref >60)
Glucose, Bld: 223 mg/dL — ABNORMAL HIGH (ref 70–99)
Potassium: 5 mmol/L (ref 3.5–5.1)
Sodium: 138 mmol/L (ref 135–145)
Total Bilirubin: 0.5 mg/dL (ref 0.3–1.2)
Total Protein: 6.4 g/dL — ABNORMAL LOW (ref 6.5–8.1)

## 2019-03-24 LAB — MAGNESIUM: Magnesium: 1.6 mg/dL — ABNORMAL LOW (ref 1.7–2.4)

## 2019-03-24 NOTE — Patient Instructions (Addendum)
[place] at [facility] Discharge Instructions  You were seen today by Dr. [name]. He went over your recent lab and scan results. He is going to hold off on restarting your treatment. He will see you back in 2 weeks for labs and follow up.   Thank you for choosing [place] at [facility] to provide your oncology and hematology care.  To afford each patient quality time with our provider, please arrive at least 15 minutes before your scheduled appointment time.   If you have a lab appointment with the [facility] please come in thru the  Main Entrance and check in at the main information desk  You need to re-schedule your appointment should you arrive 10 or more minutes late.  We strive to give you quality time with our providers, and arriving late affects you and other patients whose appointments are after yours.  Also, if you no show three or more times for appointments you may be dismissed from the clinic at the providers discretion.     Again, thank you for choosing [facility].  Our hope is that these requests will decrease the amount of time that you wait before being seen by our physicians.       _____________________________________________________________  Should you have questions after your visit to [facility], please contact our office at [phone] between the hours of 8:00 a.m. and 4:30 p.m.  Voicemails left after 4:00 p.m. will not be returned until the following business day.  For prescription refill requests, have your pharmacy contact our office and allow 72 hours.    Cancer Center Support Programs:   > Cancer Support Group  2nd Tuesday of the month 1pm-2pm, Journey Room

## 2019-03-24 NOTE — Assessment & Plan Note (Signed)
1.  Stage IV rectal cancer with lung and pancreatic metastasis: Foundation 1 CDX shows KRAS G13D and NRAS wild type, MS-stable. -12 cycles of FOLFOX in the adjuvant setting from October 12 through 07/14/2011. -15 cycles of FOLFOX and bevacizumab from 07/08/2016 through 01/20/2017 followed by infusional 5-FU and bevacizumab until 07/14/2017. -Maintenance Xeloda from 08/07/2017 with bevacizumab through 08/19/2018 with progression. - ERCP and stent placement on 09/16/2018 showing single localized biliary stricture.  Biliary sphincterotomy was performed with stricture dilated to 6 mm with balloon to facilitate stent placement.  1 covered metal stent was placed. -Flexible sigmoidoscopy on 09/24/2018 showing narrowing of the colorectal anastomosis.  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 - 2 cycles of FOLFIRI from 10/04/2018 through 10/25/2018 with 20% dose reduction during cycle 2. - He has developed severe tiredness after cycle 2 even after dose reduction. 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  -Cycle 8 of chemotherapy with FOLFIRI and bevacizumab on 02/09/2019. -He was admitted to the hospital from 02/22/2019-02/26/2019 with perforated appendicitis which was managed conservatively. -He was readmitted on 03/10/2019 through 03/14/2019.  He had similar symptoms with right lower quadrant pain.  A pigtail drain was placed on 03/12/2019 by IR. -He was seen by Dr. Arnoldo Morale on 03/22/2019 and drain was discontinued.  He now has intermittent soreness in the right lower quadrant region.  No fevers or chills reported. -I will continue to hold his chemotherapy at this time.  I will reevaluate him in 2 weeks.  I also plan to reach out to Dr. Arnoldo Morale regarding plans for surgery.   2.  Weight loss: -His appetite has not been good since hospitalization.  He is taking Megace twice daily.  3.  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  He  also has IVC filter after diagnosis in 2016.  4.  Anxiety: -He takes Klonopin 1 mg twice daily.

## 2019-03-24 NOTE — Progress Notes (Signed)
[place], [place] [address]   CLINIC:  Medical Oncology/Hematology  PCP:  [name], MD [place] [address] [phone]   REASON FOR VISIT:  Follow-up for Stage IV Colon Cancer  CURRENT THERAPY: FOLFIRI  BRIEF ONCOLOGIC HISTORY:  Oncology History  Rectal cancer ([facility])  07/24/2010 Initial Diagnosis   Invasive adenocarcinoma of rectum   09/09/2010 Concurrent Chemotherapy   S/P radiation and concurrent 5-FU continuous infusion from 09/09/10- 10/10/10.   11/14/2010 Surgery   S/P proctectomy with colorectal anastomosis and diverting loop ileostomy on 11/14/10 at [facility] by Dr. Harlon Ditty. Pathology reveals a pT3b N1 with 3/20 lymph nodes.   02/05/2011 - 07/14/2011 Chemotherapy   FOLFOX   08/18/2011 Surgery   Approximate date of surgery- [place] by Dr. Harlon Ditty    Remission     11/24/2013 Survivorship   Colonoscopy- somewhat fibrotic/friable anastomotic mucosal-status post biopsy (narrowing not felt to be clinically significant).  Negative pathology for malignancy   09/12/2014 Imaging   DVT in the left femoral venous system, left common iliac vein, IVC, and within the IVC filter Right lower extremity venography confirms chronic occlusion of the femoral venous system with collateralization. The right iliac venous system is patent and do   09/25/2014 PET scan   The right middle lobe pulmonary nodule is hypermetabolic, favored to represent a primary bronchogenic carcinoma.Equivocal mediastinal nodes, similar to surrounding blood pool. Bilateral adrenal hypermetabolism, felt to be physiologic   10/24/2014 Pathology Results   Lung, needle/core biopsy(ies), RML - ADENOCARCINOMA, SEE COMMENT metastatic adenocarcinoma of a colorectal primary   10/24/2014 Relapse/Recurrence     11/16/2014 Definitive Surgery   Bronchoscopy, right video-assisted thoracoscopy, wedge resection of right middle lobe by Dr. Servando Snare   11/16/2014  Pathology Results   Lung, wedge biopsy/resection, right lingula and small portion of middle lobe - METASTATIC ADENOCARCINOMA, CONSISTENT WITH COLORECTAL PRIMARY, SPANNING 2.0 CM. - THE SURGICAL RESECTION MARGINS ARE NEGATIVE FOR ADENOCARCINOMA.   11/16/2014 Remission   THE SURGICAL RESECTION MARGINS ARE NEGATIVE FOR ADENOCARCINOMA.   03/07/2015 Imaging   CT CAP- Interval resection of right middle lobe metastasis. No acute process or evidence of metastatic disease in the chest, abdomen or pelvis. Improved right upper lobe reticular nodular opacities are favored to represent resolving infection.   09/14/2015 Imaging   CT CAP- No findings of recurrent malignancy. No recurrence along the wedge resection site of the right middle lobe. Right anterior abdominal wall focal hernia containing a knuckle of small bowel without complicating feature.   06/13/2016 Imaging   CT CAP- 1. New right-sided pleural metastasis/mass. Other smaller right-sided pulmonary nodules which are all pleural-based and most likely represent pleural metastasis. 2. No convincing evidence of abdominopelvic nodal metastasis. 3. Constellation of findings, including pancreatic atrophy, duct dilatation, and pancreatic head soft tissue fullness which are highly suspicious for pancreatic adenocarcinoma. Metastatic disease felt much less likely. Consider endoscopic ultrasound sampling or ERCP. Cannot exclude superimposed acute pancreatitis. 4. New enlargement of the appendiceal tip with subtle surrounding edema. Cannot exclude early or mild appendicitis. 5. Coronary artery atherosclerosis. Aortic atherosclerosis. 6. 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   06/13/2016 Progression   CT scan demonstrates progression of disease   06/19/2016 Procedure   EUS with FNA by Dr. Ardis Hughs   06/20/2016 Pathology Results   FINE NEEDLE ASPIRATION, ENDOSCOPIC, PANCREAS UNCINATE AREA(SPECIMEN 1 OF 1 COLLECTED  06/19/16): MALIGNANT CELLS CONSISTENT WITH METASTATIC ADENOCARCINOMA.  06/25/2016 PET scan   1. Two pleural-based nodules in the right hemithorax are hypermetabolic. Metastatic disease is a distinct consideration. The scattered pulmonary parenchymal nodule seen on previous diagnostic CT imaging are below the threshold for reliable resolution on PET imaging. 2. Hypermetabolic lesion pancreatic head, consistent with neoplasm. As noted on prior CT, adenocarcinoma is any consideration. No evidence for hypermetabolic abdominal lymphadenopathy. Mottled uptake noted in the liver, but no discrete hepatic metastases are evident on PET imaging. 3. Appendix remains distended up to 0.9-10 mm diameter with a stone towards the tip in subtle periappendiceal edema/inflammation. The appendix is hypermetabolic along its length. Imaging features are relatively stable in the 12 day interval since prior CT scan. While appendicitis is a consideration, the relative stability over 12 days would be unusual for that etiology. Continued close follow-up recommended.   06/26/2016 Pathology Results   Not enough tissue for foundationONE or K-ras testing.   07/07/2016 Procedure   Port placed by Dr. Arnoldo Morale   07/08/2016 -  Chemotherapy   The patient had pegfilgrastim (NEULASTA) injection 6 mg, 6 mg, Subcutaneous,  Once, 0 of 4 cycles  ondansetron (ZOFRAN) IVPB 8 mg, 8 mg, Intravenous,  Once, 0 of 4 cycles  leucovorin 804 mg in dextrose 5 % 250 mL infusion, 400 mg/m2, Intravenous,  Once, 0 of 4 cycles  oxaliplatin (ELOXATIN) 170 mg in dextrose 5 % 500 mL chemo infusion, 85 mg/m2, Intravenous,  Once, 0 of 4 cycles  fluorouracil (ADRUCIL) 4,800 mg in sodium chloride 0.9 % 150 mL chemo infusion, 2,400 mg/m2 = 4,800 mg, Intravenous, 1 Day/Dose, 0 of 4 cycles  fluorouracil (ADRUCIL) chemo injection 800 mg, 400 mg/m2, Intravenous,  Once, 0 of 4 cycles  pegfilgrastim (NEULASTA) injection 6 mg, 6 mg, Subcutaneous,  Once, 2  of 2 cycles  ondansetron (ZOFRAN) IVPB 8 mg, 8 mg, Intravenous,  Once, 12 of 12 cycles  leucovorin 660 mg in dextrose 5 % 250 mL infusion, 660 mg (original dose ), Intravenous,  Once, 12 of 12 cycles Dose modification: 660 mg (Cycle 1)  oxaliplatin (ELOXATIN) 140 mg in dextrose 5 % 500 mL chemo infusion, 140 mg (original dose ), Intravenous,  Once, 12 of 12 cycles Dose modification: 140 mg (Cycle 1), 119 mg (85 % of original dose 140 mg, Cycle 8, Reason: Provider Judgment)  fluorouracil (ADRUCIL) 3,850 mg in sodium chloride 0.9 % 150 mL chemo infusion, 3,850 mg (original dose ), Intravenous, 1 Day/Dose, 12 of 12 cycles Dose modification: 3,850 mg (Cycle 1)  fluorouracil (ADRUCIL) chemo injection 700 mg, 700 mg (original dose ), Intravenous,  Once, 12 of 12 cycles Dose modification: 700 mg (Cycle 1)  palonosetron (ALOXI) injection 0.25 mg, 0.25 mg, Intravenous,  Once, 7 of 12 cycles Administration: 0.25 mg (09/30/2016)  bevacizumab (AVASTIN) 475 mg in sodium chloride 0.9 % 100 mL chemo infusion, 5 mg/kg = 475 mg, Intravenous,  Once, 6 of 11 cycles Administration: 500 mg (09/30/2016)  leucovorin 880 mg in dextrose 5 % 250 mL infusion, 400 mg/m2 = 880 mg, Intravenous,  Once, 7 of 12 cycles Administration: 880 mg (09/30/2016)  oxaliplatin (ELOXATIN) 185 mg in dextrose 5 % 500 mL chemo infusion, 85 mg/m2 = 185 mg, Intravenous,  Once, 7 of 12 cycles Administration: 185 mg (09/30/2016)  fluorouracil (ADRUCIL) chemo injection 900 mg, 400 mg/m2 = 900 mg, Intravenous,  Once, 7 of 12 cycles Administration: 900 mg (09/30/2016)  fluorouracil (ADRUCIL) 5,300 mg in sodium chloride 0.9 % 144 mL chemo infusion, 2,400 mg/m2 = 5,300  mg, Intravenous, 1 Day/Dose, 7 of 12 cycles Administration: 5,300 mg (09/30/2016)  for chemotherapy treatment.     08/27/2016 Imaging   CT CAP- 1. Pulmonary metastatic disease is stable. 2. Pancreatic head mass, grossly stable, with progressive atrophy of the body and tail  of the pancreas. Stable peripancreatic lymph node. 3. Mild circumferential rectal wall thickening, stable. 4. Aortic atherosclerosis (ICD10-170.0). Coronary artery calcification. 5. Hyperattenuating lesion off the lower pole left kidney, stable, too small to characterize. Continued attention on followup exams is warranted. 6. Persistent wall thickening and mild dilatation of the proximal appendix, of uncertain etiology. Continued attention on followup exams is warranted.   11/06/2016 Imaging   CT C/A/P: IMPRESSION: 1. Stable pulmonary metastatic disease. 2. Similar appearing pancreatic head mass. Progressed atrophy of the pancreatic body and tail. Stable peripancreatic lymph node. 3. Stable hypoattenuating lesion partially exophytic off the inferior pole of the left kidney. 4. Aortic atherosclerosis. 5. Persistent mild wall thickening and mild dilatation of the appendix.    01/30/2017 Imaging   Ct C/A/P: IMPRESSION: 1. Stable pulmonary metastatic disease. 2. Stable to slight increase in size of pancreatic head mass. 3.  Aortic Atherosclerosis (ICD10-I70.0). 4. Similar appearance of mild wall thickening of the appendix which contains an appendicolith.   05/01/2017 Imaging   CT C/A/P: Stable disease in the lungs, no progression of disease in the abdomen.   10/04/2018 -  Chemotherapy   The patient had palonosetron (ALOXI) injection 0.25 mg, 0.25 mg, Intravenous,  Once, 8 of 9 cycles Administration: 0.25 mg (10/04/2018), 0.25 mg (10/25/2018), 0.25 mg (12/01/2018), 0.25 mg (12/15/2018), 0.25 mg (12/29/2018), 0.25 mg (01/12/2019), 0.25 mg (01/26/2019), 0.25 mg (02/09/2019) pegfilgrastim (NEULASTA) injection 6 mg, 6 mg, Subcutaneous, Once, 2 of 2 cycles Administration: 6 mg (10/27/2018) pegfilgrastim-cbqv (UDENYCA) injection 6 mg, 6 mg, Subcutaneous, Once, 6 of 7 cycles Administration: 6 mg (12/03/2018), 6 mg (12/17/2018), 6 mg (12/31/2018), 6 mg (01/14/2019), 6 mg (01/28/2019), 6 mg  (02/11/2019) bevacizumab (AVASTIN) 450 mg in sodium chloride 0.9 % 100 mL chemo infusion, 5 mg/kg = 450 mg, Intravenous,  Once, 4 of 4 cycles Administration: 400 mg (12/01/2018), 400 mg (12/15/2018), 400 mg (12/29/2018) irinotecan (CAMPTOSAR) 380 mg in sodium chloride 0.9 % 500 mL chemo infusion, 180 mg/m2 = 380 mg, Intravenous,  Once, 8 of 9 cycles Dose modification: 144 mg/m2 (original dose 180 mg/m2, Cycle 2, Reason: Provider Judgment, Comment: 20% dose reduction), 90 mg/m2 (50 % of original dose 180 mg/m2, Cycle 3, Reason: Other (see comments), Comment: [id number]) Administration: 380 mg (10/04/2018), 300 mg (10/25/2018), 200 mg (12/01/2018), 200 mg (12/15/2018), 200 mg (12/29/2018), 200 mg (01/12/2019), 200 mg (01/26/2019), 200 mg (02/09/2019) leucovorin 900 mg in sodium chloride 0.9 % 250 mL infusion, 421 mg/m2 = 856 mg, Intravenous,  Once, 8 of 9 cycles Dose modification: 320 mg/m2 (original dose 400 mg/m2, Cycle 2, Reason: Provider Judgment, Comment: 20% dose reduction) Administration: 900 mg (10/04/2018), 700 mg (10/25/2018), 700 mg (12/01/2018), 700 mg (12/15/2018), 700 mg (12/29/2018), 700 mg (01/12/2019), 700 mg (01/26/2019), 700 mg (02/09/2019) fluorouracil (ADRUCIL) chemo injection 850 mg, 400 mg/m2 = 850 mg, Intravenous,  Once, 8 of 9 cycles Dose modification: 320 mg/m2 (original dose 400 mg/m2, Cycle 2, Reason: Provider Judgment, Comment: 20% dose reduction) Administration: 850 mg (10/04/2018), 700 mg (10/25/2018), 700 mg (12/01/2018), 700 mg (12/15/2018), 700 mg (12/29/2018), 700 mg (01/12/2019), 700 mg (01/26/2019), 700 mg (02/09/2019) fosaprepitant (EMEND) 150 mg, dexamethasone (DECADRON) 12 mg in sodium chloride 0.9 % 145 mL IVPB, , Intravenous,  Once, 6  of 7 cycles Administration:  (12/01/2018),  (12/15/2018),  (12/29/2018),  (01/12/2019),  (01/26/2019),  (02/09/2019) fluorouracil (ADRUCIL) 5,000 mg in sodium chloride 0.9 % 150 mL chemo infusion, 2,325 mg/m2 = 5,150 mg, Intravenous, 1 Day/Dose, 8 of 9 cycles Dose  modification: 1,920 mg/m2 (original dose 2,400 mg/m2, Cycle 2, Reason: Provider Judgment, Comment: 20% dose reduction) Administration: 5,000 mg (10/04/2018), 4,100 mg (10/25/2018), 4,100 mg (12/01/2018), 4,100 mg (12/15/2018), 4,100 mg (12/29/2018), 4,100 mg (01/12/2019), 4,100 mg (01/26/2019), 4,100 mg (02/09/2019) bevacizumab-bvzr (ZIRABEV) 400 mg in sodium chloride 0.9 % 100 mL chemo infusion, 5 mg/kg = 400 mg (100 % of original dose 5 mg/kg), Intravenous,  Once, 3 of 4 cycles Dose modification: 5 mg/kg (original dose 5 mg/kg, Cycle 6) Administration: 400 mg (01/12/2019), 400 mg (01/26/2019), 400 mg (02/09/2019)  for chemotherapy treatment.       CANCER STAGING: Cancer Staging Rectal cancer ([facility]) Staging form: Colon and Rectum, AJCC 7th Edition - Clinical: Stage IIIB (T3, N1, M0) - Signed by [name], MD on 02/04/2011 - Pathologic stage from 11/17/2014: Stage IVA (M1a) - Signed by Baird Cancer, PA-C on 09/14/2015    INTERVAL HISTORY:  Mr. Ess 68 y.o. male seen for follow-up of metastatic rectal cancer.  2 hospitalizations since last chemotherapy on 02/09/2019.  He was diagnosed with perforated appendicitis and was managed conservatively.  He had drain removed on 03/22/2019 by Dr. Arnoldo Morale.  Denies any fevers or chills.  Has occasional right lower quadrant soreness.  Appetite and energy levels are low.  He feels dizzy at times.  Numbness in the feet has been stable.  He is not eating much according to the wife who is present today.  Denies any bleeding issues.  REVIEW OF SYSTEMS:  Review of Systems  Neurological: Positive for dizziness and numbness.  All other systems reviewed and are negative.    PAST MEDICAL/SURGICAL HISTORY:  Past Medical History:  Diagnosis Date   Chronic anticoagulation    Colon cancer ([facility]) 07/24/2010   rectal ca, inv adenocarcinoma   Depression 04/01/2011   Diabetes mellitus without complication ([facility])    DVT (deep venous thrombosis) ([facility]) 05/09/2011    GERD (gastroesophageal reflux disease)    High output ileostomy ([facility]) 05/21/2011   History of kidney stones    HTN (hypertension)    Hx of radiation therapy 09/02/10 to 10/14/10   pelvis   Lung metastasis ([facility])    Neuropathy    Peripheral vascular disease ([place])    dvt's,pe   Pneumonia    hx x3   Pulmonary embolism ([facility])    Rectal cancer ([facility]) 01/15/2011   S/P radiation and concurrent 5-FU continuous infusion from 09/09/10- 10/10/10.  S/P proctectomy with colorectal anastomosis and diverting loop ileostomy on 11/14/10 at [facility] by Dr. Harlon Ditty. Pathology reveals a pT3b N1 with 3/20 lymph nodes.     Past Surgical History:  Procedure Laterality Date   BILIARY STENT PLACEMENT N/A 09/16/2018   Procedure: STENT PLACEMENT;  Surgeon: Rogene Houston, MD;  [facility];  Service: Endoscopy;  Laterality: N/A;   BIOPSY  09/24/2018   Procedure: BIOPSY;  Surgeon: Danie Binder, MD;  [facility];  Service: Endoscopy;;   COLON SURGERY  11/14/2010   proctectomy with colorectal anastomosis and diverting loop ileostomy (temporary planned)   COLONOSCOPY  07/2010   proximal rectal apple core mass 10-14cm from anal verge (adenocarcinoma), 2-3cm distal rectal carpet polyp s/p piecemeal snare polypectomy (adenoma)   COLONOSCOPY  04/21/2012  RMR: Friable,fibrotic appearing colorectal anastomosis producing some luminal narrowing-not felt to be critical. path: focal erosion with slight inflammation and hyperemia. SURVEILLANCE DUE DEC 2015   COLONOSCOPY N/A 11/24/2013   Dr. Rourk:somewhat fibrotic/friable anastomotic mucosal-status post biopsy (narrowing not felt to be clinically significant) Single colonic diverticulum. benign polypoid rectal mucosa   colostomy reversal  april 2013   ERCP N/A 09/16/2018   Procedure: ENDOSCOPIC RETROGRADE CHOLANGIOPANCREATOGRAPHY (ERCP);  Surgeon: Rogene Houston, MD;  [facility];  Service: Endoscopy;  Laterality: N/A;    ESOPHAGOGASTRODUODENOSCOPY  07/2010   RMR: schatki ring s/p dilation, small hh, SB bx benign   ESOPHAGOGASTRODUODENOSCOPY N/A 09/04/2015   Procedure: ESOPHAGOGASTRODUODENOSCOPY (EGD);  Surgeon: Daneil Dolin, MD;  [facility];  Service: Endoscopy;  Laterality: N/A;  730   EUS  08/2010   Dr. Owens Loffler. uT3N0 circumferential, nearly obstruction rectosigmoid adenocarcinoma, distal edge 12cm from anal verge   EUS N/A 06/19/2016   Procedure: UPPER ENDOSCOPIC ULTRASOUND (EUS) RADIAL;  Surgeon: Milus Banister, MD;  [facility]: [facility];  Service: Endoscopy;  Laterality: N/A;   FLEXIBLE SIGMOIDOSCOPY N/A 09/24/2018   Procedure: FLEXIBLE SIGMOIDOSCOPY;  Surgeon: Danie Binder, MD;  [facility];  Service: Endoscopy;  Laterality: N/A;   HERNIA REPAIR     abd hernia repair   IVC filter     ivc filter     port a cath placement     PORT-A-CATH REMOVAL  09/24/2011   Procedure: REMOVAL PORT-A-CATH;  Surgeon: Donato Heinz, MD;  Location: [facility];  Service: General;  Laterality: N/A;  Minor Room   PORTACATH PLACEMENT Right 07/07/2016   Procedure: INSERTION PORT-A-CATH;  Surgeon: Aviva Signs, MD;  Location: [facility];  Service: General;  Laterality: Right;   SPHINCTEROTOMY N/A 09/16/2018   Procedure: SPHINCTEROTOMY with balloon dialation;  Surgeon: Rogene Houston, MD;  [facility];  Service: Endoscopy;  Laterality: N/A;   TRANSVERSE LOOP COLOSTOMY N/A 09/29/2018   Procedure: TRANSVERSE LOOP COLOSTOMY;  Surgeon: Aviva Signs, MD;  Location: [facility];  Service: General;  Laterality: N/A;   VIDEO ASSISTED THORACOSCOPY (VATS)/WEDGE RESECTION Right 11/15/2014   Procedure: VIDEO ASSISTED THORACOSCOPY (VATS)/LUNG RESECTION WITH RIGHT LINGULECTOMY;  Surgeon: [name], MD;  Location: [facility];  Service: Thoracic;  Laterality: Right;   VIDEO BRONCHOSCOPY N/A 11/15/2014   Procedure: VIDEO BRONCHOSCOPY;  Surgeon: [name], MD;  Location: [facility] OR;  Service:  Thoracic;  Laterality: N/A;     SOCIAL HISTORY:  Social History   Socioeconomic History   Marital status: Married    Spouse name: Not on file   Number of children: 2   Years of education: Not on file   Highest education level: Not on file  Occupational History   Occupation: self-employed Regulatory affairs officer, currently not working    Fish farm manager: [employer] resource strain: Not very hard   Food insecurity    Worry: Never true    Inability: Never true   Transportation needs    Medical: No    Non-medical: No  Tobacco Use   Smoking status: Never Smoker   Smokeless tobacco: Never Used  Substance and Sexual Activity   Alcohol use: No   Drug use: No   Sexual activity: Yes    Birth control/protection: None    Comment: married  Lifestyle   Physical activity    Days per week: 0 days    Minutes per session: 0 min  Stress: Only a little  Relationships   Social connections    Talks on phone: More than three times a week    Gets together: Once a week    Attends religious service: Patient refused    Active member of club or organization: Patient refused    Attends meetings of clubs or organizations: Patient refused    Relationship status: Patient refused   Intimate partner violence    Fear of current or ex partner: Patient refused    Emotionally abused: Patient refused    Physically abused: Patient refused    Forced sexual activity: Patient refused  Other Topics Concern   Not on file  Social History Narrative   Not on file    FAMILY HISTORY:  Family History  Problem Relation Age of Onset   Cancer Brother        throat   Cancer Brother        prostate   Colon cancer Neg Hx    Liver disease Neg Hx    Inflammatory bowel disease Neg Hx     CURRENT MEDICATIONS:  Outpatient Encounter Medications as of 03/24/2019  Medication Sig Note   acetaminophen (TYLENOL) 500 MG tablet Take 1,000 mg by mouth every 6  (six) hours as needed for mild pain or moderate pain.     amoxicillin-clavulanate (AUGMENTIN) 875-125 MG tablet Take 1 tablet by mouth every 12 (twelve) hours. (Patient taking differently: Take 1 tablet by mouth every 12 (twelve) hours. Pt is finishing today 03/24/19)    clonazePAM (KLONOPIN) 1 MG tablet Take 1 tablet by mouth three times daily as needed (Patient taking differently: Take 1 mg by mouth 3 (three) times daily. )    Diphenhyd-Hydrocort-Nystatin (FIRST-DUKES MOUTHWASH) SUSP Use as directed 5 mLs in the mouth or throat 4 (four) times daily. (Patient taking differently: Use as directed 5 mLs in the mouth or throat 2 (two) times daily. )    gabapentin (NEURONTIN) 300 MG capsule TAKE 3 CAPSULES BY MOUTH TWICE DAILY (Patient taking differently: Take 900 mg by mouth 2 (two) times daily. )    glimepiride (AMARYL) 2 MG tablet Take 2 mg by mouth 2 (two) times daily before a meal.     levofloxacin (LEVAQUIN) 500 MG tablet Take 500 mg by mouth daily. 7 day course starting on 03/10/2019    mirtazapine (REMERON) 7.5 MG tablet Take 7.5 mg by mouth at bedtime.     ondansetron (ZOFRAN) 4 MG tablet Take 1 tablet (4 mg total) by mouth daily as needed for nausea or vomiting.    ondansetron (ZOFRAN) 8 MG tablet Take 1 tablet (8 mg total) by mouth 2 (two) times daily as needed for refractory nausea / vomiting. Start on day 3 after chemotherapy.    pantoprazole (PROTONIX) 40 MG tablet Take 40 mg by mouth every evening.     Probiotic Product (PROBIOTIC DAILY PO) Take 1 capsule by mouth 2 (two) times daily.     rivaroxaban (XARELTO) 20 MG TABS tablet Take 1 tablet (20 mg total) by mouth daily with supper.    vitamin B-12 (CYANOCOBALAMIN) 1000 MCG tablet Take 1,000 mcg by mouth daily. 03/10/2019: Not consistent in taking daily    Facility-Administered Encounter Medications as of 03/24/2019  Medication   sodium chloride 0.9 % injection 10 mL   sodium chloride flush (NS) 0.9 % injection 10 mL     ALLERGIES:  Allergies  Allergen Reactions   Oxycodone     Blisters, hallucinations   Tramadol  Blisters, hallucinations   Trazodone And Nefazodone Other (See Comments)    hallucinations     PHYSICAL EXAM:  ECOG Performance status: 1  Vitals:   03/24/19 0929  BP: 124/70  Pulse: (!) 107  Resp: 16  Temp: (!) 97.3 F (36.3 C)  SpO2: 99%   Filed Weights   03/24/19 0929  Weight: 163 lb 11.2 oz (74.3 kg)    Physical Exam Constitutional:      Appearance: Normal appearance.  HENT:     Head: Normocephalic.     Nose: Nose normal.     Mouth/Throat:     Mouth: Mucous membranes are moist.     Pharynx: Oropharynx is clear.  Eyes:     Extraocular Movements: Extraocular movements intact.     Conjunctiva/sclera: Conjunctivae normal.  Neck:     Musculoskeletal: Normal range of motion.  Cardiovascular:     Rate and Rhythm: Regular rhythm. Tachycardia present.     Pulses: Normal pulses.     Heart sounds: Normal heart sounds.  Pulmonary:     Effort: Pulmonary effort is normal.     Breath sounds: Normal breath sounds.  Abdominal:     General: Abdomen is flat. Bowel sounds are normal.     Palpations: Abdomen is soft.  Musculoskeletal: Normal range of motion.  Skin:    General: Skin is warm and dry.  Neurological:     General: No focal deficit present.     Mental Status: He is alert and oriented to person, place, and time.  Psychiatric:        Mood and Affect: Mood normal.        Behavior: Behavior normal.        Thought Content: Thought content normal.        Judgment: Judgment normal.      LABORATORY DATA:  I have reviewed the labs as listed.  CBC    Component Value Date/Time   WBC 8.7 03/24/2019 0828   RBC 4.01 (L) 03/24/2019 0828   HGB 11.0 (L) 03/24/2019 0828   HGB 11.6 (L) 02/04/2011 1059   HCT 37.3 (L) 03/24/2019 0828   HCT 34.7 (L) 02/04/2011 1059   PLT 306 03/24/2019 0828   PLT 282 02/04/2011 1059   MCV 93.0 03/24/2019 0828   MCV 76.0 (L)  02/04/2011 1059   MCH 27.4 03/24/2019 0828   MCHC 29.5 (L) 03/24/2019 0828   RDW 15.2 03/24/2019 0828   RDW 18.5 (H) 02/04/2011 1059   LYMPHSABS 1.5 03/24/2019 0828   LYMPHSABS 1.1 02/04/2011 1059   MONOABS 0.4 03/24/2019 0828   MONOABS 0.5 02/04/2011 1059   EOSABS 0.5 03/24/2019 0828   EOSABS 0.1 02/04/2011 1059   BASOSABS 0.0 03/24/2019 0828   BASOSABS 0.1 02/04/2011 1059   CMP Latest Ref Rng & Units 03/24/2019 03/13/2019 03/12/2019  Glucose 70 - 99 mg/dL 223(H) 168(H) 142(H)  BUN 8 - 23 mg/dL '10 13 11  ' Creatinine 0.61 - 1.24 mg/dL 0.71 0.71 0.87  Sodium 135 - 145 mmol/L 138 132(L) 134(L)  Potassium 3.5 - 5.1 mmol/L 5.0 4.1 3.9  Chloride 98 - 111 mmol/L 102 98 99  CO2 22 - 32 mmol/L 26 23 21(L)  Calcium 8.9 - 10.3 mg/dL 9.1 8.3(L) 8.5(L)  Total Protein 6.5 - 8.1 g/dL 6.4(L) - -  Total Bilirubin 0.3 - 1.2 mg/dL 0.5 - -  Alkaline Phos 38 - 126 U/L 81 - -  AST 15 - 41 U/L 16 - -  ALT 0 - 44 U/L  13 - -    I have personally reviewed the scans and discussed with the patient.   ASSESSMENT & PLAN:   Rectal cancer ([place]) 1.  Stage IV rectal cancer with lung and pancreatic metastasis: [facility] 1 CDX shows KRAS G13D and NRAS wild type, MS-stable. -12 cycles of FOLFOX in the adjuvant setting from October 12 through 07/14/2011. -15 cycles of FOLFOX and bevacizumab from 07/08/2016 through 01/20/2017 followed by infusional 5-FU and bevacizumab until 07/14/2017. -Maintenance Xeloda from 08/07/2017 with bevacizumab through 08/19/2018 with progression. - ERCP and stent placement on 09/16/2018 showing single localized biliary stricture.  Biliary sphincterotomy was performed with stricture dilated to 6 mm with balloon to facilitate stent placement.  1 covered metal stent was placed. -Flexible sigmoidoscopy on 09/24/2018 showing narrowing of the colorectal anastomosis.  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 - 2 cycles of FOLFIRI from 10/04/2018 through 10/25/2018 with 20% dose reduction during  cycle 2. - He has developed severe tiredness after cycle 2 even after dose reduction. 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  -Cycle 8 of chemotherapy with FOLFIRI and bevacizumab on 02/09/2019. -He was admitted to the hospital from 02/22/2019-02/26/2019 with perforated appendicitis which was managed conservatively. -He was readmitted on 03/10/2019 through 03/14/2019.  He had similar symptoms with right lower quadrant pain.  A pigtail drain was placed on 03/12/2019 by IR. -He was seen by Dr. Arnoldo Morale on 03/22/2019 and drain was discontinued.  He now has intermittent soreness in the right lower quadrant region.  No fevers or chills reported. -I will continue to hold his chemotherapy at this time.  I will reevaluate him in 2 weeks.  I also plan to reach out to Dr. Arnoldo Morale regarding plans for surgery.   2.  Weight loss: -His appetite has not been good since hospitalization.  He is taking Megace twice daily.  3.  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  4.  Anxiety: -He takes Klonopin 1 mg twice daily.   Total time spent is 25 minutes with more than 50% of the time spent face-to-face discussing treatment plan, counseling and coordination of care.  Orders placed this encounter:  No orders of the defined types were placed in this encounter.     Derek Jack, MD  [place] [phone]

## 2019-03-25 LAB — CEA: CEA: 10 ng/mL — ABNORMAL HIGH (ref 0.0–4.7)

## 2019-04-05 ENCOUNTER — Other Ambulatory Visit (HOSPITAL_COMMUNITY): Payer: Self-pay | Admitting: *Deleted

## 2019-04-05 ENCOUNTER — Ambulatory Visit: Payer: Medicare Other | Admitting: General Surgery

## 2019-04-05 ENCOUNTER — Ambulatory Visit (INDEPENDENT_AMBULATORY_CARE_PROVIDER_SITE_OTHER): Payer: Medicare Other | Admitting: General Surgery

## 2019-04-05 ENCOUNTER — Other Ambulatory Visit: Payer: Self-pay

## 2019-04-05 ENCOUNTER — Encounter: Payer: Self-pay | Admitting: General Surgery

## 2019-04-05 VITALS — BP 138/85 | HR 84 | Temp 97.7°F | Resp 16 | Ht 74.0 in | Wt 164.0 lb

## 2019-04-05 DIAGNOSIS — K3532 Acute appendicitis with perforation and localized peritonitis, without abscess: Secondary | ICD-10-CM | POA: Diagnosis not present

## 2019-04-05 DIAGNOSIS — C20 Malignant neoplasm of rectum: Secondary | ICD-10-CM

## 2019-04-05 DIAGNOSIS — F419 Anxiety disorder, unspecified: Secondary | ICD-10-CM

## 2019-04-05 DIAGNOSIS — C7800 Secondary malignant neoplasm of unspecified lung: Secondary | ICD-10-CM

## 2019-04-05 NOTE — Progress Notes (Signed)
Subjective:     Charles [name]  Here for posthospitalization check.  Patient denies any right lower quadrant abdominal pain, fever, or chills.  They do complain that the loop colostomy seems to have retracted somewhat.  This is making it difficult to fit the colostomy appliance. Objective:    BP 138/85 (BP Location: Left Arm, Patient Position: Sitting, Cuff Size: Normal)   Pulse 84   Temp 97.7 F (36.5 C) (Oral)   Resp 16   Ht 6\' 2"  (1.88 m)   Wt 164 lb (74.4 kg)   SpO2 97%   BMI 21.06 kg/m   General:  alert, cooperative and no distress  Abdomen soft, colostomy has retracted, though both openings are widely patent and empty outside the skin.  The wife did reapply the colostomy appliance with a smaller opening.  No right lower quadrant abdominal pain is noted.     Assessment:    Doing well after percutaneous drainage of perforated appendicitis.  No need for surgical intervention at this time.    Plan:   Patient going to oncology in 2 days for chemotherapy.  Will follow peripherally as needed.

## 2019-04-06 MED ORDER — CLONAZEPAM 1 MG PO TABS: 1.0000 mg | ORAL_TABLET | Freq: Three times a day (TID) | ORAL | 0 refills | Status: DC | PRN

## 2019-04-07 ENCOUNTER — Inpatient Hospital Stay (HOSPITAL_COMMUNITY): Payer: Medicare Other

## 2019-04-07 ENCOUNTER — Encounter (HOSPITAL_COMMUNITY): Payer: Self-pay | Admitting: Hematology

## 2019-04-07 ENCOUNTER — Inpatient Hospital Stay (HOSPITAL_COMMUNITY): Payer: Medicare Other | Attending: Hematology | Admitting: Hematology

## 2019-04-07 ENCOUNTER — Other Ambulatory Visit: Payer: Self-pay

## 2019-04-07 VITALS — BP 143/85 | HR 108 | Temp 97.1°F | Resp 18 | Wt 161.6 lb

## 2019-04-07 DIAGNOSIS — C7801 Secondary malignant neoplasm of right lung: Secondary | ICD-10-CM | POA: Diagnosis not present

## 2019-04-07 DIAGNOSIS — I82511 Chronic embolism and thrombosis of right femoral vein: Secondary | ICD-10-CM | POA: Diagnosis not present

## 2019-04-07 DIAGNOSIS — E119 Type 2 diabetes mellitus without complications: Secondary | ICD-10-CM | POA: Diagnosis not present

## 2019-04-07 DIAGNOSIS — C189 Malignant neoplasm of colon, unspecified: Secondary | ICD-10-CM

## 2019-04-07 DIAGNOSIS — C20 Malignant neoplasm of rectum: Secondary | ICD-10-CM

## 2019-04-07 DIAGNOSIS — I2699 Other pulmonary embolism without acute cor pulmonale: Secondary | ICD-10-CM | POA: Diagnosis not present

## 2019-04-07 DIAGNOSIS — R1031 Right lower quadrant pain: Secondary | ICD-10-CM | POA: Diagnosis not present

## 2019-04-07 DIAGNOSIS — R0781 Pleurodynia: Secondary | ICD-10-CM | POA: Insufficient documentation

## 2019-04-07 DIAGNOSIS — R634 Abnormal weight loss: Secondary | ICD-10-CM | POA: Diagnosis not present

## 2019-04-07 DIAGNOSIS — Z808 Family history of malignant neoplasm of other organs or systems: Secondary | ICD-10-CM | POA: Diagnosis not present

## 2019-04-07 DIAGNOSIS — C7889 Secondary malignant neoplasm of other digestive organs: Secondary | ICD-10-CM | POA: Diagnosis not present

## 2019-04-07 DIAGNOSIS — Z8042 Family history of malignant neoplasm of prostate: Secondary | ICD-10-CM | POA: Insufficient documentation

## 2019-04-07 DIAGNOSIS — Z7901 Long term (current) use of anticoagulants: Secondary | ICD-10-CM | POA: Insufficient documentation

## 2019-04-07 DIAGNOSIS — R630 Anorexia: Secondary | ICD-10-CM | POA: Insufficient documentation

## 2019-04-07 DIAGNOSIS — I82531 Chronic embolism and thrombosis of right popliteal vein: Secondary | ICD-10-CM | POA: Insufficient documentation

## 2019-04-07 DIAGNOSIS — R079 Chest pain, unspecified: Secondary | ICD-10-CM | POA: Diagnosis not present

## 2019-04-07 DIAGNOSIS — Z923 Personal history of irradiation: Secondary | ICD-10-CM | POA: Diagnosis not present

## 2019-04-07 DIAGNOSIS — I10 Essential (primary) hypertension: Secondary | ICD-10-CM | POA: Insufficient documentation

## 2019-04-07 DIAGNOSIS — F419 Anxiety disorder, unspecified: Secondary | ICD-10-CM | POA: Diagnosis not present

## 2019-04-07 LAB — CBC WITH DIFFERENTIAL/PLATELET
Abs Immature Granulocytes: 0.02 10*3/uL (ref 0.00–0.07)
Basophils Absolute: 0 10*3/uL (ref 0.0–0.1)
Basophils Relative: 1 %
Eosinophils Absolute: 0.3 10*3/uL (ref 0.0–0.5)
Eosinophils Relative: 6 %
HCT: 40.9 % (ref 39.0–52.0)
Hemoglobin: 12.2 g/dL — ABNORMAL LOW (ref 13.0–17.0)
Immature Granulocytes: 0 %
Lymphocytes Relative: 25 %
Lymphs Abs: 1.4 10*3/uL (ref 0.7–4.0)
MCH: 27.4 pg (ref 26.0–34.0)
MCHC: 29.8 g/dL — ABNORMAL LOW (ref 30.0–36.0)
MCV: 91.7 fL (ref 80.0–100.0)
Monocytes Absolute: 0.3 10*3/uL (ref 0.1–1.0)
Monocytes Relative: 5 %
Neutro Abs: 3.5 10*3/uL (ref 1.7–7.7)
Neutrophils Relative %: 63 %
Platelets: 248 10*3/uL (ref 150–400)
RBC: 4.46 MIL/uL (ref 4.22–5.81)
RDW: 15.9 % — ABNORMAL HIGH (ref 11.5–15.5)
WBC: 5.6 10*3/uL (ref 4.0–10.5)
nRBC: 0 % (ref 0.0–0.2)

## 2019-04-07 LAB — COMPREHENSIVE METABOLIC PANEL
ALT: 10 U/L (ref 0–44)
AST: 17 U/L (ref 15–41)
Albumin: 3.1 g/dL — ABNORMAL LOW (ref 3.5–5.0)
Alkaline Phosphatase: 103 U/L (ref 38–126)
Anion gap: 11 (ref 5–15)
BUN: 9 mg/dL (ref 8–23)
CO2: 26 mmol/L (ref 22–32)
Calcium: 9 mg/dL (ref 8.9–10.3)
Chloride: 103 mmol/L (ref 98–111)
Creatinine, Ser: 0.73 mg/dL (ref 0.61–1.24)
GFR calc Af Amer: >60 mL/min (ref >60)
GFR calc non Af Amer: >60 mL/min (ref >60)
Glucose, Bld: 242 mg/dL — ABNORMAL HIGH (ref 70–99)
Potassium: 4.1 mmol/L (ref 3.5–5.1)
Sodium: 140 mmol/L (ref 135–145)
Total Bilirubin: 0.8 mg/dL (ref 0.3–1.2)
Total Protein: 6.6 g/dL (ref 6.5–8.1)

## 2019-04-07 MED ORDER — OXYCODONE-ACETAMINOPHEN 10-325 MG PO TABS: 1.0000 | ORAL_TABLET | Freq: Every evening | ORAL | 0 refills | Status: DC | PRN

## 2019-04-07 NOTE — Patient Instructions (Addendum)
[place] at [facility] Discharge Instructions  You were seen today by Dr. [name]. He went over your recent lab results. He will send in a refill of your pain medication. He will repeat your scans. He will see you back after your scans for follow up.   Thank you for choosing [place] at [facility] to provide your oncology and hematology care.  To afford each patient quality time with our provider, please arrive at least 15 minutes before your scheduled appointment time.   If you have a lab appointment with the [facility] please come in thru the  Main Entrance and check in at the main information desk  You need to re-schedule your appointment should you arrive 10 or more minutes late.  We strive to give you quality time with our providers, and arriving late affects you and other patients whose appointments are after yours.  Also, if you no show three or more times for appointments you may be dismissed from the clinic at the providers discretion.     Again, thank you for choosing [facility].  Our hope is that these requests will decrease the amount of time that you wait before being seen by our physicians.       _____________________________________________________________  Should you have questions after your visit to [facility], please contact our office at [phone] between the hours of 8:00 a.m. and 4:30 p.m.  Voicemails left after 4:00 p.m. will not be returned until the following business day.  For prescription refill requests, have your pharmacy contact our office and allow 72 hours.    Cancer Center Support Programs:   > Cancer Support Group  2nd Tuesday of the month 1pm-2pm, Journey Room

## 2019-04-07 NOTE — Progress Notes (Signed)
Charles [name], [place] [address]   CLINIC:  Medical Oncology/Hematology  PCP:  [name], MD [place] [address] [phone]   REASON FOR VISIT:  Follow-up for Stage IV Colon Cancer  CURRENT THERAPY: FOLFIRI and bevacizumab on hold  BRIEF ONCOLOGIC HISTORY:  Oncology History  Rectal cancer ([place])  07/24/2010 Initial Diagnosis   Invasive adenocarcinoma of rectum   09/09/2010 Concurrent Chemotherapy   S/P radiation and concurrent 5-FU continuous infusion from 09/09/10- 10/10/10.   11/14/2010 Surgery   S/P proctectomy with colorectal anastomosis and diverting loop ileostomy on 11/14/10 at [facility] by Dr. Harlon Ditty. Pathology reveals a pT3b N1 with 3/20 lymph nodes.   02/05/2011 - 07/14/2011 Chemotherapy   FOLFOX   08/18/2011 Surgery   Approximate date of surgery- [place] by Dr. Harlon Ditty    Remission     11/24/2013 Survivorship   Colonoscopy- somewhat fibrotic/friable anastomotic mucosal-status post biopsy (narrowing not felt to be clinically significant).  Negative pathology for malignancy   09/12/2014 Imaging   DVT in the left femoral venous system, left common iliac vein, IVC, and within the IVC filter Right lower extremity venography confirms chronic occlusion of the femoral venous system with collateralization. The right iliac venous system is patent and do   09/25/2014 PET scan   The right middle lobe pulmonary nodule is hypermetabolic, favored to represent a primary bronchogenic carcinoma.Equivocal mediastinal nodes, similar to surrounding blood pool. Bilateral adrenal hypermetabolism, felt to be physiologic   10/24/2014 Pathology Results   Lung, needle/core biopsy(ies), RML - ADENOCARCINOMA, SEE COMMENT metastatic adenocarcinoma of a colorectal primary   10/24/2014 Relapse/Recurrence     11/16/2014 Definitive Surgery   Bronchoscopy, right video-assisted thoracoscopy, wedge resection of right middle lobe by Dr.  Servando Snare   11/16/2014 Pathology Results   Lung, wedge biopsy/resection, right lingula and small portion of middle lobe - METASTATIC ADENOCARCINOMA, CONSISTENT WITH COLORECTAL PRIMARY, SPANNING 2.0 CM. - THE SURGICAL RESECTION MARGINS ARE NEGATIVE FOR ADENOCARCINOMA.   11/16/2014 Remission   THE SURGICAL RESECTION MARGINS ARE NEGATIVE FOR ADENOCARCINOMA.   03/07/2015 Imaging   CT CAP- Interval resection of right middle lobe metastasis. No acute process or evidence of metastatic disease in the chest, abdomen or pelvis. Improved right upper lobe reticular nodular opacities are favored to represent resolving infection.   09/14/2015 Imaging   CT CAP- No findings of recurrent malignancy. No recurrence along the wedge resection site of the right middle lobe. Right anterior abdominal wall focal hernia containing a knuckle of small bowel without complicating feature.   06/13/2016 Imaging   CT CAP- 1. New right-sided pleural metastasis/mass. Other smaller right-sided pulmonary nodules which are all pleural-based and most likely represent pleural metastasis. 2. No convincing evidence of abdominopelvic nodal metastasis. 3. Constellation of findings, including pancreatic atrophy, duct dilatation, and pancreatic head soft tissue fullness which are highly suspicious for pancreatic adenocarcinoma. Metastatic disease felt much less likely. Consider endoscopic ultrasound sampling or ERCP. Cannot exclude superimposed acute pancreatitis. 4. New enlargement of the appendiceal tip with subtle surrounding edema. Cannot exclude early or mild appendicitis. 5. Coronary artery atherosclerosis. Aortic atherosclerosis. 6. 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   06/13/2016 Progression   CT scan demonstrates progression of disease   06/19/2016 Procedure   EUS with FNA by Dr. Ardis Hughs   06/20/2016 Pathology Results   FINE NEEDLE ASPIRATION, ENDOSCOPIC, PANCREAS UNCINATE AREA(SPECIMEN 1 OF 1  COLLECTED 06/19/16): MALIGNANT CELLS CONSISTENT WITH  METASTATIC ADENOCARCINOMA.   06/25/2016 PET scan   1. Two pleural-based nodules in the right hemithorax are hypermetabolic. Metastatic disease is a distinct consideration. The scattered pulmonary parenchymal nodule seen on previous diagnostic CT imaging are below the threshold for reliable resolution on PET imaging. 2. Hypermetabolic lesion pancreatic head, consistent with neoplasm. As noted on prior CT, adenocarcinoma is any consideration. No evidence for hypermetabolic abdominal lymphadenopathy. Mottled uptake noted in the liver, but no discrete hepatic metastases are evident on PET imaging. 3. Appendix remains distended up to 0.9-10 mm diameter with a stone towards the tip in subtle periappendiceal edema/inflammation. The appendix is hypermetabolic along its length. Imaging features are relatively stable in the 12 day interval since prior CT scan. While appendicitis is a consideration, the relative stability over 12 days would be unusual for that etiology. Continued close follow-up recommended.   06/26/2016 Pathology Results   Not enough tissue for foundationONE or K-ras testing.   07/07/2016 Procedure   Port placed by Dr. Arnoldo Morale   07/08/2016 -  Chemotherapy   The patient had pegfilgrastim (NEULASTA) injection 6 mg, 6 mg, Subcutaneous,  Once, 0 of 4 cycles  ondansetron (ZOFRAN) IVPB 8 mg, 8 mg, Intravenous,  Once, 0 of 4 cycles  leucovorin 804 mg in dextrose 5 % 250 mL infusion, 400 mg/m2, Intravenous,  Once, 0 of 4 cycles  oxaliplatin (ELOXATIN) 170 mg in dextrose 5 % 500 mL chemo infusion, 85 mg/m2, Intravenous,  Once, 0 of 4 cycles  fluorouracil (ADRUCIL) 4,800 mg in sodium chloride 0.9 % 150 mL chemo infusion, 2,400 mg/m2 = 4,800 mg, Intravenous, 1 Day/Dose, 0 of 4 cycles  fluorouracil (ADRUCIL) chemo injection 800 mg, 400 mg/m2, Intravenous,  Once, 0 of 4 cycles  pegfilgrastim (NEULASTA) injection 6 mg, 6 mg,  Subcutaneous,  Once, 2 of 2 cycles  ondansetron (ZOFRAN) IVPB 8 mg, 8 mg, Intravenous,  Once, 12 of 12 cycles  leucovorin 660 mg in dextrose 5 % 250 mL infusion, 660 mg (original dose ), Intravenous,  Once, 12 of 12 cycles Dose modification: 660 mg (Cycle 1)  oxaliplatin (ELOXATIN) 140 mg in dextrose 5 % 500 mL chemo infusion, 140 mg (original dose ), Intravenous,  Once, 12 of 12 cycles Dose modification: 140 mg (Cycle 1), 119 mg (85 % of original dose 140 mg, Cycle 8, Reason: Provider Judgment)  fluorouracil (ADRUCIL) 3,850 mg in sodium chloride 0.9 % 150 mL chemo infusion, 3,850 mg (original dose ), Intravenous, 1 Day/Dose, 12 of 12 cycles Dose modification: 3,850 mg (Cycle 1)  fluorouracil (ADRUCIL) chemo injection 700 mg, 700 mg (original dose ), Intravenous,  Once, 12 of 12 cycles Dose modification: 700 mg (Cycle 1)  palonosetron (ALOXI) injection 0.25 mg, 0.25 mg, Intravenous,  Once, 7 of 12 cycles Administration: 0.25 mg (09/30/2016)  bevacizumab (AVASTIN) 475 mg in sodium chloride 0.9 % 100 mL chemo infusion, 5 mg/kg = 475 mg, Intravenous,  Once, 6 of 11 cycles Administration: 500 mg (09/30/2016)  leucovorin 880 mg in dextrose 5 % 250 mL infusion, 400 mg/m2 = 880 mg, Intravenous,  Once, 7 of 12 cycles Administration: 880 mg (09/30/2016)  oxaliplatin (ELOXATIN) 185 mg in dextrose 5 % 500 mL chemo infusion, 85 mg/m2 = 185 mg, Intravenous,  Once, 7 of 12 cycles Administration: 185 mg (09/30/2016)  fluorouracil (ADRUCIL) chemo injection 900 mg, 400 mg/m2 = 900 mg, Intravenous,  Once, 7 of 12 cycles Administration: 900 mg (09/30/2016)  fluorouracil (ADRUCIL) 5,300 mg in sodium chloride 0.9 % 144 mL chemo infusion,  2,400 mg/m2 = 5,300 mg, Intravenous, 1 Day/Dose, 7 of 12 cycles Administration: 5,300 mg (09/30/2016)  for chemotherapy treatment.     08/27/2016 Imaging   CT CAP- 1. Pulmonary metastatic disease is stable. 2. Pancreatic head mass, grossly stable, with progressive atrophy  of the body and tail of the pancreas. Stable peripancreatic lymph node. 3. Mild circumferential rectal wall thickening, stable. 4. Aortic atherosclerosis (ICD10-170.0). Coronary artery calcification. 5. Hyperattenuating lesion off the lower pole left kidney, stable, too small to characterize. Continued attention on followup exams is warranted. 6. Persistent wall thickening and mild dilatation of the proximal appendix, of uncertain etiology. Continued attention on followup exams is warranted.   11/06/2016 Imaging   CT C/A/P: IMPRESSION: 1. Stable pulmonary metastatic disease. 2. Similar appearing pancreatic head mass. Progressed atrophy of the pancreatic body and tail. Stable peripancreatic lymph node. 3. Stable hypoattenuating lesion partially exophytic off the inferior pole of the left kidney. 4. Aortic atherosclerosis. 5. Persistent mild wall thickening and mild dilatation of the appendix.    01/30/2017 Imaging   Ct C/A/P: IMPRESSION: 1. Stable pulmonary metastatic disease. 2. Stable to slight increase in size of pancreatic head mass. 3.  Aortic Atherosclerosis (ICD10-I70.0). 4. Similar appearance of mild wall thickening of the appendix which contains an appendicolith.   05/01/2017 Imaging   CT C/A/P: Stable disease in the lungs, no progression of disease in the abdomen.   10/04/2018 -  Chemotherapy   The patient had palonosetron (ALOXI) injection 0.25 mg, 0.25 mg, Intravenous,  Once, 8 of 9 cycles Administration: 0.25 mg (10/04/2018), 0.25 mg (10/25/2018), 0.25 mg (12/01/2018), 0.25 mg (12/15/2018), 0.25 mg (12/29/2018), 0.25 mg (01/12/2019), 0.25 mg (01/26/2019), 0.25 mg (02/09/2019) pegfilgrastim (NEULASTA) injection 6 mg, 6 mg, Subcutaneous, Once, 2 of 2 cycles Administration: 6 mg (10/27/2018) pegfilgrastim-cbqv (UDENYCA) injection 6 mg, 6 mg, Subcutaneous, Once, 6 of 7 cycles Administration: 6 mg (12/03/2018), 6 mg (12/17/2018), 6 mg (12/31/2018), 6 mg (01/14/2019), 6 mg (01/28/2019),  6 mg (02/11/2019) bevacizumab (AVASTIN) 450 mg in sodium chloride 0.9 % 100 mL chemo infusion, 5 mg/kg = 450 mg, Intravenous,  Once, 4 of 4 cycles Administration: 400 mg (12/01/2018), 400 mg (12/15/2018), 400 mg (12/29/2018) irinotecan (CAMPTOSAR) 380 mg in sodium chloride 0.9 % 500 mL chemo infusion, 180 mg/m2 = 380 mg, Intravenous,  Once, 8 of 9 cycles Dose modification: 144 mg/m2 (original dose 180 mg/m2, Cycle 2, Reason: Provider Judgment, Comment: 20% dose reduction), 90 mg/m2 (50 % of original dose 180 mg/m2, Cycle 3, Reason: Other (see comments), Comment: [id number]) Administration: 380 mg (10/04/2018), 300 mg (10/25/2018), 200 mg (12/01/2018), 200 mg (12/15/2018), 200 mg (12/29/2018), 200 mg (01/12/2019), 200 mg (01/26/2019), 200 mg (02/09/2019) leucovorin 900 mg in sodium chloride 0.9 % 250 mL infusion, 421 mg/m2 = 856 mg, Intravenous,  Once, 8 of 9 cycles Dose modification: 320 mg/m2 (original dose 400 mg/m2, Cycle 2, Reason: Provider Judgment, Comment: 20% dose reduction) Administration: 900 mg (10/04/2018), 700 mg (10/25/2018), 700 mg (12/01/2018), 700 mg (12/15/2018), 700 mg (12/29/2018), 700 mg (01/12/2019), 700 mg (01/26/2019), 700 mg (02/09/2019) fluorouracil (ADRUCIL) chemo injection 850 mg, 400 mg/m2 = 850 mg, Intravenous,  Once, 8 of 9 cycles Dose modification: 320 mg/m2 (original dose 400 mg/m2, Cycle 2, Reason: Provider Judgment, Comment: 20% dose reduction) Administration: 850 mg (10/04/2018), 700 mg (10/25/2018), 700 mg (12/01/2018), 700 mg (12/15/2018), 700 mg (12/29/2018), 700 mg (01/12/2019), 700 mg (01/26/2019), 700 mg (02/09/2019) fosaprepitant (EMEND) 150 mg, dexamethasone (DECADRON) 12 mg in sodium chloride 0.9 % 145 mL IVPB, ,  Intravenous,  Once, 6 of 7 cycles Administration:  (12/01/2018),  (12/15/2018),  (12/29/2018),  (01/12/2019),  (01/26/2019),  (02/09/2019) fluorouracil (ADRUCIL) 5,000 mg in sodium chloride 0.9 % 150 mL chemo infusion, 2,325 mg/m2 = 5,150 mg, Intravenous, 1 Day/Dose, 8 of 9 cycles Dose  modification: 1,920 mg/m2 (original dose 2,400 mg/m2, Cycle 2, Reason: Provider Judgment, Comment: 20% dose reduction) Administration: 5,000 mg (10/04/2018), 4,100 mg (10/25/2018), 4,100 mg (12/01/2018), 4,100 mg (12/15/2018), 4,100 mg (12/29/2018), 4,100 mg (01/12/2019), 4,100 mg (01/26/2019), 4,100 mg (02/09/2019) bevacizumab-bvzr (ZIRABEV) 400 mg in sodium chloride 0.9 % 100 mL chemo infusion, 5 mg/kg = 400 mg (100 % of original dose 5 mg/kg), Intravenous,  Once, 3 of 4 cycles Dose modification: 5 mg/kg (original dose 5 mg/kg, Cycle 6) Administration: 400 mg (01/12/2019), 400 mg (01/26/2019), 400 mg (02/09/2019)  for chemotherapy treatment.       CANCER STAGING: Cancer Staging Rectal cancer ([place]) Staging form: Colon and Rectum, AJCC 7th Edition - Clinical: Stage IIIB (T3, N1, M0) - Signed by [name], MD on 02/04/2011 - Pathologic stage from 11/17/2014: Stage IVA (M1a) - Signed by Baird Cancer, PA-C on 09/14/2015    INTERVAL HISTORY:  Mr. Cleland 68 y.o. male seen for follow-up of metastatic rectal cancer.  He was evaluated by Dr. Arnoldo Morale  yesterday.  Conservative management was recommended.  Appetite is low with low energy levels.  He has been drinking about 3 cans of Ensure per day.  He lost 2 pounds since last visit.  He is eating 2 meals per day.  He is complaining of pain in the right chest wall since he fell after discharge from the hospital.  He is accompanied by his wife today.  REVIEW OF SYSTEMS:  Review of Systems  Cardiovascular: Positive for chest pain.  Neurological: Positive for dizziness and numbness.  All other systems reviewed and are negative.    PAST MEDICAL/SURGICAL HISTORY:  Past Medical History:  Diagnosis Date  . Chronic anticoagulation   . Colon cancer ([place]) 07/24/2010   rectal ca, inv adenocarcinoma  . Depression 04/01/2011  . Diabetes mellitus without complication ([place])   . DVT (deep venous thrombosis) ([place]) 05/09/2011  . GERD (gastroesophageal reflux  disease)   . High output ileostomy ([place]) 05/21/2011  . History of kidney stones   . HTN (hypertension)   . Hx of radiation therapy 09/02/10 to 10/14/10   pelvis  . Lung metastasis ([place])   . Neuropathy   . Peripheral vascular disease ([place])    dvt's,pe  . Pneumonia    hx x3  . Pulmonary embolism ([place])   . Rectal cancer ([place]) 01/15/2011   S/P radiation and concurrent 5-FU continuous infusion from 09/09/10- 10/10/10.  S/P proctectomy with colorectal anastomosis and diverting loop ileostomy on 11/14/10 at [facility] by Dr. Harlon Ditty. Pathology reveals a pT3b N1 with 3/20 lymph nodes.     Past Surgical History:  Procedure Laterality Date  . BILIARY STENT PLACEMENT N/A 09/16/2018   Procedure: STENT PLACEMENT;  Surgeon: Rogene Houston, MD;  [facility];  Service: Endoscopy;  Laterality: N/A;  . BIOPSY  09/24/2018   Procedure: BIOPSY;  Surgeon: Danie Binder, MD;  [facility];  Service: Endoscopy;;  . COLON SURGERY  11/14/2010   proctectomy with colorectal anastomosis and diverting loop ileostomy (temporary planned)  . COLONOSCOPY  07/2010   proximal rectal apple core mass 10-14cm from anal verge (adenocarcinoma), 2-3cm distal rectal carpet polyp s/p piecemeal snare polypectomy (adenoma)  .  COLONOSCOPY  04/21/2012   RMR: Friable,fibrotic appearing colorectal anastomosis producing some luminal narrowing-not felt to be critical. path: focal erosion with slight inflammation and hyperemia. SURVEILLANCE DUE DEC 2015  . COLONOSCOPY N/A 11/24/2013   Dr. Rourk:somewhat fibrotic/friable anastomotic mucosal-status post biopsy (narrowing not felt to be clinically significant) Single colonic diverticulum. benign polypoid rectal mucosa  . colostomy reversal  april 2013  . ERCP N/A 09/16/2018   Procedure: ENDOSCOPIC RETROGRADE CHOLANGIOPANCREATOGRAPHY (ERCP);  Surgeon: Rogene Houston, MD;  [facility];  Service: Endoscopy;  Laterality: N/A;  . ESOPHAGOGASTRODUODENOSCOPY   07/2010   RMR: schatki ring s/p dilation, small hh, SB bx benign  . ESOPHAGOGASTRODUODENOSCOPY N/A 09/04/2015   Procedure: ESOPHAGOGASTRODUODENOSCOPY (EGD);  Surgeon: Daneil Dolin, MD;  [facility];  Service: Endoscopy;  Laterality: N/A;  730  . EUS  08/2010   Dr. Owens Loffler. uT3N0 circumferential, nearly obstruction rectosigmoid adenocarcinoma, distal edge 12cm from anal verge  . EUS N/A 06/19/2016   Procedure: UPPER ENDOSCOPIC ULTRASOUND (EUS) RADIAL;  Surgeon: Milus Banister, MD;  Location[facility];  Service: Endoscopy;  Laterality: N/A;  . FLEXIBLE SIGMOIDOSCOPY N/A 09/24/2018   Procedure: FLEXIBLE SIGMOIDOSCOPY;  Surgeon: Danie Binder, MD;  [facility];  Service: Endoscopy;  Laterality: N/A;  . HERNIA REPAIR     abd hernia repair  . IVC filter    . ivc filter    . port a cath placement    . PORT-A-CATH REMOVAL  09/24/2011   Procedure: REMOVAL PORT-A-CATH;  Surgeon: Donato Heinz, MD;  Location: [facility];  Service: General;  Laterality: N/A;  Minor Room  . PORTACATH PLACEMENT Right 07/07/2016   Procedure: INSERTION PORT-A-CATH;  Surgeon: Aviva Signs, MD;  Location: [facility];  Service: General;  Laterality: Right;  . SPHINCTEROTOMY N/A 09/16/2018   Procedure: SPHINCTEROTOMY with balloon dialation;  Surgeon: Rogene Houston, MD;  [facility];  Service: Endoscopy;  Laterality: N/A;  . TRANSVERSE LOOP COLOSTOMY N/A 09/29/2018   Procedure: TRANSVERSE LOOP COLOSTOMY;  Surgeon: Aviva Signs, MD;  Location: [facility];  Service: General;  Laterality: N/A;  . VIDEO ASSISTED THORACOSCOPY (VATS)/WEDGE RESECTION Right 11/15/2014   Procedure: VIDEO ASSISTED THORACOSCOPY (VATS)/LUNG RESECTION WITH RIGHT LINGULECTOMY;  Surgeon: [name], MD;  Location: [place];  Service: Thoracic;  Laterality: Right;  Marland Kitchen VIDEO BRONCHOSCOPY N/A 11/15/2014   Procedure: VIDEO BRONCHOSCOPY;  Surgeon: [name], MD;  Location: [facility] OR;  Service: Thoracic;  Laterality: N/A;      SOCIAL HISTORY:  Social History   Socioeconomic History  . Marital status: Married    Spouse name: Not on file  . Number of children: 2  . Years of education: Not on file  . Highest education level: Not on file  Occupational History  . Occupation: self-employed Regulatory affairs officer, currently not working    Fish farm manager: SELF EMPLOYED  Social Needs  . Financial resource strain: Not very hard  . Food insecurity    Worry: Never true    Inability: Never true  . Transportation needs    Medical: No    Non-medical: No  Tobacco Use  . Smoking status: Never Smoker  . Smokeless tobacco: Never Used  Substance and Sexual Activity  . Alcohol use: No  . Drug use: No  . Sexual activity: Yes    Birth control/protection: None    Comment: married  Lifestyle  . Physical activity    Days per week: 0 days    Minutes per  session: 0 min  . Stress: Only a little  Relationships  . Social connections    Talks on phone: More than three times a week    Gets together: Once a week    Attends religious service: Patient refused    Active member of club or organization: Patient refused    Attends meetings of clubs or organizations: Patient refused    Relationship status: Patient refused  . Intimate partner violence    Fear of current or ex partner: Patient refused    Emotionally abused: Patient refused    Physically abused: Patient refused    Forced sexual activity: Patient refused  Other Topics Concern  . Not on file  Social History Narrative  . Not on file    FAMILY HISTORY:  Family History  Problem Relation Age of Onset  . Cancer Brother        throat  . Cancer Brother        prostate  . Colon cancer Neg Hx   . Liver disease Neg Hx   . Inflammatory bowel disease Neg Hx     CURRENT MEDICATIONS:  Outpatient Encounter Medications as of 04/07/2019  Medication Sig Note  . Diphenhyd-Hydrocort-Nystatin (FIRST-DUKES MOUTHWASH) SUSP Use as directed 5 mLs in the mouth or throat 4  (four) times daily. (Patient taking differently: Use as directed 5 mLs in the mouth or throat 2 (two) times daily. )   . gabapentin (NEURONTIN) 300 MG capsule TAKE 3 CAPSULES BY MOUTH TWICE DAILY (Patient taking differently: Take 900 mg by mouth 2 (two) times daily. )   . glimepiride (AMARYL) 2 MG tablet Take 2 mg by mouth 2 (two) times daily before a meal.    . pantoprazole (PROTONIX) 40 MG tablet Take 40 mg by mouth every evening.    . Probiotic Product (PROBIOTIC DAILY PO) Take 1 capsule by mouth 2 (two) times daily.    . rivaroxaban (XARELTO) 20 MG TABS tablet Take 1 tablet (20 mg total) by mouth daily with supper.   . vitamin B-12 (CYANOCOBALAMIN) 1000 MCG tablet Take 1,000 mcg by mouth daily. 03/10/2019: Not consistent in taking daily   . acetaminophen (TYLENOL) 500 MG tablet Take 1,000 mg by mouth every 6 (six) hours as needed for mild pain or moderate pain.    . clonazePAM (KLONOPIN) 1 MG tablet Take 1 tablet (1 mg total) by mouth 3 (three) times daily as needed. (Patient not taking: Reported on 04/07/2019)   . ondansetron (ZOFRAN) 4 MG tablet Take 1 tablet (4 mg total) by mouth daily as needed for nausea or vomiting. (Patient not taking: Reported on 04/07/2019)   . ondansetron (ZOFRAN) 8 MG tablet Take 1 tablet (8 mg total) by mouth 2 (two) times daily as needed for refractory nausea / vomiting. Start on day 3 after chemotherapy. (Patient not taking: Reported on 04/07/2019)   . oxyCODONE-acetaminophen (PERCOCET) 10-325 MG tablet Take 1 tablet by mouth at bedtime as needed for pain.   . [DISCONTINUED] amoxicillin-clavulanate (AUGMENTIN) 875-125 MG tablet Take 1 tablet by mouth every 12 (twelve) hours. (Patient taking differently: Take 1 tablet by mouth every 12 (twelve) hours. Pt is finishing today 03/24/19)   . [DISCONTINUED] levofloxacin (LEVAQUIN) 500 MG tablet Take 500 mg by mouth daily. 7 day course starting on 03/10/2019   . [DISCONTINUED] mirtazapine (REMERON) 7.5 MG tablet Take 7.5 mg by  mouth at bedtime.     Facility-Administered Encounter Medications as of 04/07/2019  Medication  . sodium chloride 0.9 % injection  10 mL  . sodium chloride flush (NS) 0.9 % injection 10 mL    ALLERGIES:  Allergies  Allergen Reactions  . Oxycodone     Blisters, hallucinations  . Tramadol     Blisters, hallucinations  . Trazodone And Nefazodone Other (See Comments)    hallucinations     PHYSICAL EXAM:  ECOG Performance status: 1  Vitals:   04/07/19 1148  BP: (!) 143/85  Pulse: (!) 108  Resp: 18  Temp: (!) 97.1 F (36.2 C)  SpO2: 100%   Filed Weights   04/07/19 1148  Weight: 161 lb 9.6 oz (73.3 kg)    Physical Exam Constitutional:      Appearance: Normal appearance.  HENT:     Head: Normocephalic.     Nose: Nose normal.     Mouth/Throat:     Mouth: Mucous membranes are moist.     Pharynx: Oropharynx is clear.  Eyes:     Extraocular Movements: Extraocular movements intact.     Conjunctiva/sclera: Conjunctivae normal.  Neck:     Musculoskeletal: Normal range of motion.  Cardiovascular:     Rate and Rhythm: Regular rhythm. Tachycardia present.     Pulses: Normal pulses.     Heart sounds: Normal heart sounds.  Pulmonary:     Effort: Pulmonary effort is normal.     Breath sounds: Normal breath sounds.  Abdominal:     General: Abdomen is flat. Bowel sounds are normal.     Palpations: Abdomen is soft.  Musculoskeletal: Normal range of motion.  Skin:    General: Skin is warm and dry.  Neurological:     General: No focal deficit present.     Mental Status: He is alert and oriented to person, place, and time.  Psychiatric:        Mood and Affect: Mood normal.        Behavior: Behavior normal.        Thought Content: Thought content normal.        Judgment: Judgment normal.      LABORATORY DATA:  I have reviewed the labs as listed.  CBC    Component Value Date/Time   WBC 5.6 04/07/2019 1059   RBC 4.46 04/07/2019 1059   HGB 12.2 (L) 04/07/2019 1059    HGB 11.6 (L) 02/04/2011 1059   HCT 40.9 04/07/2019 1059   HCT 34.7 (L) 02/04/2011 1059   PLT 248 04/07/2019 1059   PLT 282 02/04/2011 1059   MCV 91.7 04/07/2019 1059   MCV 76.0 (L) 02/04/2011 1059   MCH 27.4 04/07/2019 1059   MCHC 29.8 (L) 04/07/2019 1059   RDW 15.9 (H) 04/07/2019 1059   RDW 18.5 (H) 02/04/2011 1059   LYMPHSABS 1.4 04/07/2019 1059   LYMPHSABS 1.1 02/04/2011 1059   MONOABS 0.3 04/07/2019 1059   MONOABS 0.5 02/04/2011 1059   EOSABS 0.3 04/07/2019 1059   EOSABS 0.1 02/04/2011 1059   BASOSABS 0.0 04/07/2019 1059   BASOSABS 0.1 02/04/2011 1059   CMP Latest Ref Rng & Units 04/07/2019 03/24/2019 03/13/2019  Glucose 70 - 99 mg/dL 242(H) 223(H) 168(H)  BUN 8 - 23 mg/dL '9 10 13  ' Creatinine 0.61 - 1.24 mg/dL 0.73 0.71 0.71  Sodium 135 - 145 mmol/L 140 138 132(L)  Potassium 3.5 - 5.1 mmol/L 4.1 5.0 4.1  Chloride 98 - 111 mmol/L 103 102 98  CO2 22 - 32 mmol/L '26 26 23  ' Calcium 8.9 - 10.3 mg/dL 9.0 9.1 8.3(L)  Total Protein 6.5 - 8.1 g/dL  6.6 6.4(L) -  Total Bilirubin 0.3 - 1.2 mg/dL 0.8 0.5 -  Alkaline Phos 38 - 126 U/L 103 81 -  AST 15 - 41 U/L 17 16 -  ALT 0 - 44 U/L 10 13 -    I have personally reviewed the scans and discussed with the patient.   ASSESSMENT & PLAN:   Rectal cancer ([place]) 1.  Stage IV rectal cancer with lung and pancreatic metastasis: [facility] 1 CDX shows KRAS G13D and NRAS wild type, MS-stable. -12 cycles of FOLFOX in the adjuvant setting from October 12 through 07/14/2011. -15 cycles of FOLFOX and bevacizumab from 07/08/2016 through 01/20/2017 followed by infusional 5-FU and bevacizumab until 07/14/2017. -Maintenance Xeloda from 08/07/2017 with bevacizumab through 08/19/2018 with progression. - ERCP and stent placement on 09/16/2018 showing single localized biliary stricture.  Biliary sphincterotomy was performed with stricture dilated to 6 mm with balloon to facilitate stent placement.  1 covered metal stent was placed. -Flexible sigmoidoscopy on  09/24/2018 showing narrowing of the colorectal anastomosis.  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 - 2 cycles of FOLFIRI from 10/04/2018 through 10/25/2018 with 20% dose reduction during cycle 2. - He has developed severe tiredness after cycle 2 even after dose reduction. 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  -Cycle 8 of chemotherapy with FOLFIRI and bevacizumab on 02/09/2019. -He was admitted to the hospital from 02/22/2019-02/26/2019 with perforated appendicitis which was managed conservatively. -He was readmitted on 03/10/2019 through 03/14/2019.  He had similar symptoms with right lower quadrant pain.  A pigtail drain was placed on 03/12/2019 by IR. -Drain discontinued on 03/22/2019. -He was seen by Dr. Arnoldo Morale on 04/05/2019 and conservative management was recommended.  Patient reports on and off pain in the right lower quadrant, mostly notices when he is in bed at nighttime. -He also reported pain in the right anterior ribs, predominantly at nighttime.  He reportedly fell after he came back from hospital. -He has mild tenderness in the right anterior rib.  I will give him prescription for Percocet 10 mg to be taken at bedtime. -I have recommended doing a restaging CT CAP with contrast.  His most recent CEA level went up to 10 on 03/24/2019.  Was previously 4.6 on 01/26/2019. -We will see him back after CT CAP.  If stable disease, might consider restarting him on just FOLFIRI without bevacizumab.   2.  Weight loss: -He lost about 2 pounds since last visit.  He is drinking Ensure 3 cans/day and eating 2 meals per day.  He reports decreased appetite.  He was taking Megace previously which he stopped. -I have told him to increase Ensure to 4 to 5 cans/day.  3.  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  4.  Anxiety: -He takes Klonopin 1 mg twice daily.     Orders placed this encounter:  Orders Placed This Encounter  Procedures  . CT Abdomen Pelvis W Contrast  . CT Chest W Contrast      Derek Jack, MD  [facility] [phone]

## 2019-04-07 NOTE — Assessment & Plan Note (Addendum)
1.  Stage IV rectal cancer with lung and pancreatic metastasis: Foundation 1 CDX shows KRAS G13D and NRAS wild type, MS-stable. -12 cycles of FOLFOX in the adjuvant setting from October 12 through 07/14/2011. -15 cycles of FOLFOX and bevacizumab from 07/08/2016 through 01/20/2017 followed by infusional 5-FU and bevacizumab until 07/14/2017. -Maintenance Xeloda from 08/07/2017 with bevacizumab through 08/19/2018 with progression. - ERCP and stent placement on 09/16/2018 showing single localized biliary stricture.  Biliary sphincterotomy was performed with stricture dilated to 6 mm with balloon to facilitate stent placement.  1 covered metal stent was placed. -Flexible sigmoidoscopy on 09/24/2018 showing narrowing of the colorectal anastomosis.  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 - 2 cycles of FOLFIRI from 10/04/2018 through 10/25/2018 with 20% dose reduction during cycle 2. - He has developed severe tiredness after cycle 2 even after dose reduction. 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  -Cycle 8 of chemotherapy with FOLFIRI and bevacizumab on 02/09/2019. -He was admitted to the hospital from 02/22/2019-02/26/2019 with perforated appendicitis which was managed conservatively. -He was readmitted on 03/10/2019 through 03/14/2019.  He had similar symptoms with right lower quadrant pain.  A pigtail drain was placed on 03/12/2019 by IR. -Drain discontinued on 03/22/2019. -He was seen by Dr. Arnoldo Morale on 04/05/2019 and conservative management was recommended.  Patient reports on and off pain in the right lower quadrant, mostly notices when he is in bed at nighttime. -He also reported pain in the right anterior ribs, predominantly at nighttime.  He reportedly fell after he came back from hospital. -He has mild tenderness in the right anterior rib.  I will give him prescription for Percocet 10 mg to be taken at bedtime. -I  have recommended doing a restaging CT CAP with contrast.  His most recent CEA level went up to 10 on 03/24/2019.  Was previously 4.6 on 01/26/2019. -We will see him back after CT CAP.  If stable disease, might consider restarting him on just FOLFIRI without bevacizumab.   2.  Weight loss: -He lost about 2 pounds since last visit.  He is drinking Ensure 3 cans/day and eating 2 meals per day.  He reports decreased appetite.  He was taking Megace previously which he stopped. -I have told him to increase Ensure to 4 to 5 cans/day.  3.  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  4.  Anxiety: -He takes Klonopin 1 mg twice daily.

## 2019-04-15 DIAGNOSIS — E119 Type 2 diabetes mellitus without complications: Secondary | ICD-10-CM | POA: Diagnosis not present

## 2019-04-15 DIAGNOSIS — I10 Essential (primary) hypertension: Secondary | ICD-10-CM | POA: Diagnosis not present

## 2019-04-19 ENCOUNTER — Ambulatory Visit (HOSPITAL_COMMUNITY)
Admission: RE | Admit: 2019-04-19 | Discharge: 2019-04-19 | Disposition: A | Discharge status: Home / Self Care | Payer: Medicare Other | Source: Ambulatory Visit | Attending: Hematology | Admitting: Hematology

## 2019-04-19 ENCOUNTER — Other Ambulatory Visit: Payer: Self-pay

## 2019-04-19 DIAGNOSIS — R079 Chest pain, unspecified: Secondary | ICD-10-CM | POA: Diagnosis present

## 2019-04-19 DIAGNOSIS — C189 Malignant neoplasm of colon, unspecified: Secondary | ICD-10-CM | POA: Insufficient documentation

## 2019-04-19 MED ORDER — IOHEXOL 300 MG/ML  SOLN
100.0000 mL | Freq: Once | INTRAMUSCULAR | Status: AC | PRN
  Administered 2019-04-19: 13:00:00 100 mL via INTRAVENOUS

## 2019-04-20 ENCOUNTER — Inpatient Hospital Stay (HOSPITAL_BASED_OUTPATIENT_CLINIC_OR_DEPARTMENT_OTHER): Payer: Medicare Other | Admitting: Hematology

## 2019-04-20 ENCOUNTER — Encounter (HOSPITAL_COMMUNITY): Payer: Self-pay | Admitting: Hematology

## 2019-04-20 VITALS — BP 146/84 | HR 95 | Temp 97.5°F | Resp 18 | Wt 162.0 lb

## 2019-04-20 DIAGNOSIS — C20 Malignant neoplasm of rectum: Secondary | ICD-10-CM

## 2019-04-20 MED ORDER — DRONABINOL 5 MG PO CAPS: 5.0000 mg | ORAL_CAPSULE | Freq: Two times a day (BID) | ORAL | 3 refills | Status: DC

## 2019-04-20 MED ORDER — OXYCODONE-ACETAMINOPHEN 10-325 MG PO TABS: 1.0000 | ORAL_TABLET | Freq: Three times a day (TID) | ORAL | 0 refills | Status: DC | PRN

## 2019-04-20 NOTE — Patient Instructions (Signed)
[place] at [facility] Discharge Instructions  You were seen today by Dr. [name]. He went over your recent scan results. He will see you back in 3 weeks for labs, treatment and follow up.   Thank you for choosing [place] at [facility] to provide your oncology and hematology care.  To afford each patient quality time with our provider, please arrive at least 15 minutes before your scheduled appointment time.   If you have a lab appointment with the [facility] please come in thru the  Main Entrance and check in at the main information desk  You need to re-schedule your appointment should you arrive 10 or more minutes late.  We strive to give you quality time with our providers, and arriving late affects you and other patients whose appointments are after yours.  Also, if you no show three or more times for appointments you may be dismissed from the clinic at the providers discretion.     Again, thank you for choosing [facility].  Our hope is that these requests will decrease the amount of time that you wait before being seen by our physicians.       _____________________________________________________________  Should you have questions after your visit to [facility], please contact our office at [phone] between the hours of 8:00 a.m. and 4:30 p.m.  Voicemails left after 4:00 p.m. will not be returned until the following business day.  For prescription refill requests, have your pharmacy contact our office and allow 72 hours.    Cancer Center Support Programs:   > Cancer Support Group  2nd Tuesday of the month 1pm-2pm, Journey Room

## 2019-04-20 NOTE — Assessment & Plan Note (Signed)
1.  Stage IV rectal cancer with lung and pancreatic metastasis: Foundation 1 CDX shows KRAS G13D and NRAS wild type, MS-stable. -12 cycles of FOLFOX in the adjuvant setting from October 12 through 07/14/2011. -15 cycles of FOLFOX and bevacizumab from 07/08/2016 through 01/20/2017 followed by infusional 5-FU and bevacizumab until 07/14/2017. -Maintenance Xeloda from 08/07/2017 with bevacizumab through 08/19/2018 with progression. - ERCP and stent placement on 09/16/2018 showing single localized biliary stricture.  Biliary sphincterotomy was performed with stricture dilated to 6 mm with balloon to facilitate stent placement.  1 covered metal stent was placed. -Flexible sigmoidoscopy on 09/24/2018 showing narrowing of the colorectal anastomosis.  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 - 2 cycles of FOLFIRI from 10/04/2018 through 10/25/2018 with 20% dose reduction during cycle 2. - He has developed severe tiredness after cycle 2 even after dose reduction. 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  -Cycle 8 of chemotherapy with FOLFIRI and bevacizumab on 02/09/2019. -He was admitted to the hospital from 02/22/2019-02/26/2019 with perforated appendicitis which was managed conservatively. -He was readmitted on 03/10/2019 through 03/14/2019.  He had similar symptoms with right lower quadrant pain.  A pigtail drain was placed on 03/12/2019 by IR.  Drain discontinued on March 22, 2019. -CEA on March 24, 2019 increased to 10. -We reviewed results of the CT CAP from April 19, 2019 which showed slight interval enlargement of a subpleural nodule of the posterior right upper lobe, measuring 1.2 cm, previously 1.0 cm.  Interval enlargement of a subpleural nodule of the dependent right lower lobe measuring 1.1, previously 0.7.  New hypodense inferior right lobe of the liver lesion measuring 8 mm suspicious for mets.  Pancreatic  head mass measures 3.1 x 2.3 cm, previously 2.6 x 2.1 cm.  Sclerotic lesion of the posterior right 10th rib, with increasing adjacent soft tissue component. -As he has progressive disease, I recommended restarting chemotherapy.  Upon further discussion, he would like to start after holidays.  We will see him back in 3 weeks to initiate his chemotherapy.  We will initially start with FOLFIRI and see how he tolerates it.  We will plan to add bevacizumab after second or third cycle.   2.  Weight loss: -His weight is more or less stable at 162 in the last 2 weeks, but he has lost weight prior to that. -He is drinking 4 to 5 cans of boost per day.  Eating small portions. -We have sent prescription for Megace 5 mg twice daily for appetite stimulation.  3.  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  4.  Anxiety: -He will continue Klonopin 1 mg twice daily.  5.  Right-sided rib pain: -He reports pain in the right posterior rib region which often radiates to the front of the abdomen. -We will start him on Percocet 10/325 mg to be taken every 8 hours as needed.  This is from metastatic site.

## 2019-04-20 NOTE — Progress Notes (Signed)
Charles [name], [place] [id number]   CLINIC:  Medical Oncology/Hematology  PCP:  [name], MD [place]  [id number] [phone]   REASON FOR VISIT:  Follow-up for Stage IV Colon Cancer  CURRENT THERAPY: FOLFIRI and bevacizumab on hold  BRIEF ONCOLOGIC HISTORY:  Oncology History  Rectal cancer ([place])  07/24/2010 Initial Diagnosis   Invasive adenocarcinoma of rectum   09/09/2010 Concurrent Chemotherapy   S/P radiation and concurrent 5-FU continuous infusion from 09/09/10- 10/10/10.   11/14/2010 Surgery   S/P proctectomy with colorectal anastomosis and diverting loop ileostomy on 11/14/10 at [facility] by Dr. Harlon Ditty. Pathology reveals a pT3b N1 with 3/20 lymph nodes.   02/05/2011 - 07/14/2011 Chemotherapy   FOLFOX   08/18/2011 Surgery   Approximate date of surgery- [place] by Dr. Harlon Ditty    Remission     11/24/2013 Survivorship   Colonoscopy- somewhat fibrotic/friable anastomotic mucosal-status post biopsy (narrowing not felt to be clinically significant).  Negative pathology for malignancy   09/12/2014 Imaging   DVT in the left femoral venous system, left common iliac vein, IVC, and within the IVC filter Right lower extremity venography confirms chronic occlusion of the femoral venous system with collateralization. The right iliac venous system is patent and do   09/25/2014 PET scan   The right middle lobe pulmonary nodule is hypermetabolic, favored to represent a primary bronchogenic carcinoma.Equivocal mediastinal nodes, similar to surrounding blood pool. Bilateral adrenal hypermetabolism, felt to be physiologic   10/24/2014 Pathology Results   Lung, needle/core biopsy(ies), RML - ADENOCARCINOMA, SEE COMMENT metastatic adenocarcinoma of a colorectal primary   10/24/2014 Relapse/Recurrence     11/16/2014 Definitive Surgery   Bronchoscopy, right video-assisted thoracoscopy, wedge resection of right middle lobe by Dr.  Servando Snare   11/16/2014 Pathology Results   Lung, wedge biopsy/resection, right lingula and small portion of middle lobe - METASTATIC ADENOCARCINOMA, CONSISTENT WITH COLORECTAL PRIMARY, SPANNING 2.0 CM. - THE SURGICAL RESECTION MARGINS ARE NEGATIVE FOR ADENOCARCINOMA.   11/16/2014 Remission   THE SURGICAL RESECTION MARGINS ARE NEGATIVE FOR ADENOCARCINOMA.   03/07/2015 Imaging   CT CAP- Interval resection of right middle lobe metastasis. No acute process or evidence of metastatic disease in the chest, abdomen or pelvis. Improved right upper lobe reticular nodular opacities are favored to represent resolving infection.   09/14/2015 Imaging   CT CAP- No findings of recurrent malignancy. No recurrence along the wedge resection site of the right middle lobe. Right anterior abdominal wall focal hernia containing a knuckle of small bowel without complicating feature.   06/13/2016 Imaging   CT CAP- 1. New right-sided pleural metastasis/mass. Other smaller right-sided pulmonary nodules which are all pleural-based and most likely represent pleural metastasis. 2. No convincing evidence of abdominopelvic nodal metastasis. 3. Constellation of findings, including pancreatic atrophy, duct dilatation, and pancreatic head soft tissue fullness which are highly suspicious for pancreatic adenocarcinoma. Metastatic disease felt much less likely. Consider endoscopic ultrasound sampling or ERCP. Cannot exclude superimposed acute pancreatitis. 4. New enlargement of the appendiceal tip with subtle surrounding edema. Cannot exclude early or mild appendicitis. 5. Coronary artery atherosclerosis. Aortic atherosclerosis. 6. 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   06/13/2016 Progression   CT scan demonstrates progression of disease   06/19/2016 Procedure   EUS with FNA by Dr. Ardis Hughs   06/20/2016 Pathology Results   FINE NEEDLE ASPIRATION, ENDOSCOPIC, PANCREAS UNCINATE AREA(SPECIMEN 1 OF 1  COLLECTED 06/19/16): MALIGNANT CELLS CONSISTENT WITH  METASTATIC ADENOCARCINOMA.   06/25/2016 PET scan   1. Two pleural-based nodules in the right hemithorax are hypermetabolic. Metastatic disease is a distinct consideration. The scattered pulmonary parenchymal nodule seen on previous diagnostic CT imaging are below the threshold for reliable resolution on PET imaging. 2. Hypermetabolic lesion pancreatic head, consistent with neoplasm. As noted on prior CT, adenocarcinoma is any consideration. No evidence for hypermetabolic abdominal lymphadenopathy. Mottled uptake noted in the liver, but no discrete hepatic metastases are evident on PET imaging. 3. Appendix remains distended up to 0.9-10 mm diameter with a stone towards the tip in subtle periappendiceal edema/inflammation. The appendix is hypermetabolic along its length. Imaging features are relatively stable in the 12 day interval since prior CT scan. While appendicitis is a consideration, the relative stability over 12 days would be unusual for that etiology. Continued close follow-up recommended.   06/26/2016 Pathology Results   Not enough tissue for foundationONE or K-ras testing.   07/07/2016 Procedure   Port placed by Dr. Arnoldo Morale   07/08/2016 -  Chemotherapy   The patient had pegfilgrastim (NEULASTA) injection 6 mg, 6 mg, Subcutaneous,  Once, 0 of 4 cycles  ondansetron (ZOFRAN) IVPB 8 mg, 8 mg, Intravenous,  Once, 0 of 4 cycles  leucovorin 804 mg in dextrose 5 % 250 mL infusion, 400 mg/m2, Intravenous,  Once, 0 of 4 cycles  oxaliplatin (ELOXATIN) 170 mg in dextrose 5 % 500 mL chemo infusion, 85 mg/m2, Intravenous,  Once, 0 of 4 cycles  fluorouracil (ADRUCIL) 4,800 mg in sodium chloride 0.9 % 150 mL chemo infusion, 2,400 mg/m2 = 4,800 mg, Intravenous, 1 Day/Dose, 0 of 4 cycles  fluorouracil (ADRUCIL) chemo injection 800 mg, 400 mg/m2, Intravenous,  Once, 0 of 4 cycles  pegfilgrastim (NEULASTA) injection 6 mg, 6 mg,  Subcutaneous,  Once, 2 of 2 cycles  ondansetron (ZOFRAN) IVPB 8 mg, 8 mg, Intravenous,  Once, 12 of 12 cycles  leucovorin 660 mg in dextrose 5 % 250 mL infusion, 660 mg (original dose ), Intravenous,  Once, 12 of 12 cycles Dose modification: 660 mg (Cycle 1)  oxaliplatin (ELOXATIN) 140 mg in dextrose 5 % 500 mL chemo infusion, 140 mg (original dose ), Intravenous,  Once, 12 of 12 cycles Dose modification: 140 mg (Cycle 1), 119 mg (85 % of original dose 140 mg, Cycle 8, Reason: Provider Judgment)  fluorouracil (ADRUCIL) 3,850 mg in sodium chloride 0.9 % 150 mL chemo infusion, 3,850 mg (original dose ), Intravenous, 1 Day/Dose, 12 of 12 cycles Dose modification: 3,850 mg (Cycle 1)  fluorouracil (ADRUCIL) chemo injection 700 mg, 700 mg (original dose ), Intravenous,  Once, 12 of 12 cycles Dose modification: 700 mg (Cycle 1)  palonosetron (ALOXI) injection 0.25 mg, 0.25 mg, Intravenous,  Once, 7 of 12 cycles Administration: 0.25 mg (09/30/2016)  bevacizumab (AVASTIN) 475 mg in sodium chloride 0.9 % 100 mL chemo infusion, 5 mg/kg = 475 mg, Intravenous,  Once, 6 of 11 cycles Administration: 500 mg (09/30/2016)  leucovorin 880 mg in dextrose 5 % 250 mL infusion, 400 mg/m2 = 880 mg, Intravenous,  Once, 7 of 12 cycles Administration: 880 mg (09/30/2016)  oxaliplatin (ELOXATIN) 185 mg in dextrose 5 % 500 mL chemo infusion, 85 mg/m2 = 185 mg, Intravenous,  Once, 7 of 12 cycles Administration: 185 mg (09/30/2016)  fluorouracil (ADRUCIL) chemo injection 900 mg, 400 mg/m2 = 900 mg, Intravenous,  Once, 7 of 12 cycles Administration: 900 mg (09/30/2016)  fluorouracil (ADRUCIL) 5,300 mg in sodium chloride 0.9 % 144 mL chemo infusion,  2,400 mg/m2 = 5,300 mg, Intravenous, 1 Day/Dose, 7 of 12 cycles Administration: 5,300 mg (09/30/2016)  for chemotherapy treatment.     08/27/2016 Imaging   CT CAP- 1. Pulmonary metastatic disease is stable. 2. Pancreatic head mass, grossly stable, with progressive atrophy  of the body and tail of the pancreas. Stable peripancreatic lymph node. 3. Mild circumferential rectal wall thickening, stable. 4. Aortic atherosclerosis (ICD10-170.0). Coronary artery calcification. 5. Hyperattenuating lesion off the lower pole left kidney, stable, too small to characterize. Continued attention on followup exams is warranted. 6. Persistent wall thickening and mild dilatation of the proximal appendix, of uncertain etiology. Continued attention on followup exams is warranted.   11/06/2016 Imaging   CT C/A/P: IMPRESSION: 1. Stable pulmonary metastatic disease. 2. Similar appearing pancreatic head mass. Progressed atrophy of the pancreatic body and tail. Stable peripancreatic lymph node. 3. Stable hypoattenuating lesion partially exophytic off the inferior pole of the left kidney. 4. Aortic atherosclerosis. 5. Persistent mild wall thickening and mild dilatation of the appendix.    01/30/2017 Imaging   Ct C/A/P: IMPRESSION: 1. Stable pulmonary metastatic disease. 2. Stable to slight increase in size of pancreatic head mass. 3.  Aortic Atherosclerosis (ICD10-I70.0). 4. Similar appearance of mild wall thickening of the appendix which contains an appendicolith.   05/01/2017 Imaging   CT C/A/P: Stable disease in the lungs, no progression of disease in the abdomen.   10/04/2018 -  Chemotherapy   The patient had palonosetron (ALOXI) injection 0.25 mg, 0.25 mg, Intravenous,  Once, 8 of 9 cycles Administration: 0.25 mg (10/04/2018), 0.25 mg (10/25/2018), 0.25 mg (12/01/2018), 0.25 mg (12/15/2018), 0.25 mg (12/29/2018), 0.25 mg (01/12/2019), 0.25 mg (01/26/2019), 0.25 mg (02/09/2019) pegfilgrastim (NEULASTA) injection 6 mg, 6 mg, Subcutaneous, Once, 2 of 2 cycles Administration: 6 mg (10/27/2018) pegfilgrastim-cbqv (UDENYCA) injection 6 mg, 6 mg, Subcutaneous, Once, 6 of 7 cycles Administration: 6 mg (12/03/2018), 6 mg (12/17/2018), 6 mg (12/31/2018), 6 mg (01/14/2019), 6 mg (01/28/2019),  6 mg (02/11/2019) bevacizumab (AVASTIN) 450 mg in sodium chloride 0.9 % 100 mL chemo infusion, 5 mg/kg = 450 mg, Intravenous,  Once, 4 of 4 cycles Administration: 400 mg (12/01/2018), 400 mg (12/15/2018), 400 mg (12/29/2018) irinotecan (CAMPTOSAR) 380 mg in sodium chloride 0.9 % 500 mL chemo infusion, 180 mg/m2 = 380 mg, Intravenous,  Once, 8 of 9 cycles Dose modification: 144 mg/m2 (original dose 180 mg/m2, Cycle 2, Reason: Provider Judgment, Comment: 20% dose reduction), 90 mg/m2 (50 % of original dose 180 mg/m2, Cycle 3, Reason: Other (see comments), Comment: [id number]) Administration: 380 mg (10/04/2018), 300 mg (10/25/2018), 200 mg (12/01/2018), 200 mg (12/15/2018), 200 mg (12/29/2018), 200 mg (01/12/2019), 200 mg (01/26/2019), 200 mg (02/09/2019) leucovorin 900 mg in sodium chloride 0.9 % 250 mL infusion, 421 mg/m2 = 856 mg, Intravenous,  Once, 8 of 9 cycles Dose modification: 320 mg/m2 (original dose 400 mg/m2, Cycle 2, Reason: Provider Judgment, Comment: 20% dose reduction) Administration: 900 mg (10/04/2018), 700 mg (10/25/2018), 700 mg (12/01/2018), 700 mg (12/15/2018), 700 mg (12/29/2018), 700 mg (01/12/2019), 700 mg (01/26/2019), 700 mg (02/09/2019) fluorouracil (ADRUCIL) chemo injection 850 mg, 400 mg/m2 = 850 mg, Intravenous,  Once, 8 of 9 cycles Dose modification: 320 mg/m2 (original dose 400 mg/m2, Cycle 2, Reason: Provider Judgment, Comment: 20% dose reduction) Administration: 850 mg (10/04/2018), 700 mg (10/25/2018), 700 mg (12/01/2018), 700 mg (12/15/2018), 700 mg (12/29/2018), 700 mg (01/12/2019), 700 mg (01/26/2019), 700 mg (02/09/2019) fosaprepitant (EMEND) 150 mg, dexamethasone (DECADRON) 12 mg in sodium chloride 0.9 % 145 mL IVPB, ,  Intravenous,  Once, 6 of 7 cycles Administration:  (12/01/2018),  (12/15/2018),  (12/29/2018),  (01/12/2019),  (01/26/2019),  (02/09/2019) fluorouracil (ADRUCIL) 5,000 mg in sodium chloride 0.9 % 150 mL chemo infusion, 2,325 mg/m2 = 5,150 mg, Intravenous, 1 Day/Dose, 8 of 9 cycles Dose  modification: 1,920 mg/m2 (original dose 2,400 mg/m2, Cycle 2, Reason: Provider Judgment, Comment: 20% dose reduction) Administration: 5,000 mg (10/04/2018), 4,100 mg (10/25/2018), 4,100 mg (12/01/2018), 4,100 mg (12/15/2018), 4,100 mg (12/29/2018), 4,100 mg (01/12/2019), 4,100 mg (01/26/2019), 4,100 mg (02/09/2019) bevacizumab-bvzr (ZIRABEV) 400 mg in sodium chloride 0.9 % 100 mL chemo infusion, 5 mg/kg = 400 mg (100 % of original dose 5 mg/kg), Intravenous,  Once, 3 of 4 cycles Dose modification: 5 mg/kg (original dose 5 mg/kg, Cycle 6) Administration: 400 mg (01/12/2019), 400 mg (01/26/2019), 400 mg (02/09/2019)  for chemotherapy treatment.       CANCER STAGING: Cancer Staging Rectal cancer ([place]) Staging form: Colon and Rectum, AJCC 7th Edition - Clinical: Stage IIIB (T3, N1, M0) - Signed by [name], MD on 02/04/2011 - Pathologic stage from 11/17/2014: Stage IVA (M1a) - Signed by Baird Cancer, PA-C on 09/14/2015    INTERVAL HISTORY:  Mr. Schiavo 68 y.o. male seen for follow-up of metastatic rectal cancer.  He had nonsurgical management of appendiceal abscess.  He did not lose any weight in the last 2 weeks.  Appetite is low.  Energy levels are 75%.  Reports pain in the posterior right rib which often radiates to the front.  Numbness in the hands and feet has been stable.  Denies any nosebleeds, gum bleeds, bleeding per rectum or melena melena.  He is drinking about 4 to 5 cans of boost and eating very small portions.  Denies any fevers or chills.  Denies any right lower quadrant pain.  REVIEW OF SYSTEMS:  Review of Systems  Neurological: Positive for numbness.  All other systems reviewed and are negative.    PAST MEDICAL/SURGICAL HISTORY:  Past Medical History:  Diagnosis Date  . Chronic anticoagulation   . Colon cancer ([place]) 07/24/2010   rectal ca, inv adenocarcinoma  . Depression 04/01/2011  . Diabetes mellitus without complication ([place])   . DVT (deep venous thrombosis)  ([place]) 05/09/2011  . GERD (gastroesophageal reflux disease)   . High output ileostomy ([place]) 05/21/2011  . History of kidney stones   . HTN (hypertension)   . Hx of radiation therapy 09/02/10 to 10/14/10   pelvis  . Lung metastasis ([place])   . Neuropathy   . Peripheral vascular disease ([place])    dvt's,pe  . Pneumonia    hx x3  . Pulmonary embolism ([place])   . Rectal cancer ([place]) 01/15/2011   S/P radiation and concurrent 5-FU continuous infusion from 09/09/10- 10/10/10.  S/P proctectomy with colorectal anastomosis and diverting loop ileostomy on 11/14/10 at [facility] by Dr. Harlon Ditty. Pathology reveals a pT3b N1 with 3/20 lymph nodes.     Past Surgical History:  Procedure Laterality Date  . BILIARY STENT PLACEMENT N/A 09/16/2018   Procedure: STENT PLACEMENT;  Surgeon: Rogene Houston, MD;  [facility];  Service: Endoscopy;  Laterality: N/A;  . BIOPSY  09/24/2018   Procedure: BIOPSY;  Surgeon: Danie Binder, MD;  [facility];  Service: Endoscopy;;  . COLON SURGERY  11/14/2010   proctectomy with colorectal anastomosis and diverting loop ileostomy (temporary planned)  . COLONOSCOPY  07/2010   proximal rectal apple core mass 10-14cm from anal verge (adenocarcinoma), 2-3cm distal  rectal carpet polyp s/p piecemeal snare polypectomy (adenoma)  . COLONOSCOPY  04/21/2012   RMR: Friable,fibrotic appearing colorectal anastomosis producing some luminal narrowing-not felt to be critical. path: focal erosion with slight inflammation and hyperemia. SURVEILLANCE DUE DEC 2015  . COLONOSCOPY N/A 11/24/2013   Dr. Rourk:somewhat fibrotic/friable anastomotic mucosal-status post biopsy (narrowing not felt to be clinically significant) Single colonic diverticulum. benign polypoid rectal mucosa  . colostomy reversal  april 2013  . ERCP N/A 09/16/2018   Procedure: ENDOSCOPIC RETROGRADE CHOLANGIOPANCREATOGRAPHY (ERCP);  Surgeon: Rogene Houston, MD;  [facility];  Service: Endoscopy;   Laterality: N/A;  . ESOPHAGOGASTRODUODENOSCOPY  07/2010   RMR: schatki ring s/p dilation, small hh, SB bx benign  . ESOPHAGOGASTRODUODENOSCOPY N/A 09/04/2015   Procedure: ESOPHAGOGASTRODUODENOSCOPY (EGD);  Surgeon: Daneil Dolin, MD;  [facility];  Service: Endoscopy;  Laterality: N/A;  730  . EUS  08/2010   Dr. Owens Loffler. uT3N0 circumferential, nearly obstruction rectosigmoid adenocarcinoma, distal edge 12cm from anal verge  . EUS N/A 06/19/2016   Procedure: UPPER ENDOSCOPIC ULTRASOUND (EUS) RADIAL;  Surgeon: Milus Banister, MD;  [facility]: [facility];  Service: Endoscopy;  Laterality: N/A;  . FLEXIBLE SIGMOIDOSCOPY N/A 09/24/2018   Procedure: FLEXIBLE SIGMOIDOSCOPY;  Surgeon: Danie Binder, MD;  [facility];  Service: Endoscopy;  Laterality: N/A;  . HERNIA REPAIR     abd hernia repair  . IVC filter    . ivc filter    . port a cath placement    . PORT-A-CATH REMOVAL  09/24/2011   Procedure: REMOVAL PORT-A-CATH;  Surgeon: Donato Heinz, MD;  Location: [facility];  Service: General;  Laterality: N/A;  Minor Room  . PORTACATH PLACEMENT Right 07/07/2016   Procedure: INSERTION PORT-A-CATH;  Surgeon: Aviva Signs, MD;  Location: [facility];  Service: General;  Laterality: Right;  . SPHINCTEROTOMY N/A 09/16/2018   Procedure: SPHINCTEROTOMY with balloon dialation;  Surgeon: Rogene Houston, MD;  [facility];  Service: Endoscopy;  Laterality: N/A;  . TRANSVERSE LOOP COLOSTOMY N/A 09/29/2018   Procedure: TRANSVERSE LOOP COLOSTOMY;  Surgeon: Aviva Signs, MD;  Location: [facility];  Service: General;  Laterality: N/A;  . VIDEO ASSISTED THORACOSCOPY (VATS)/WEDGE RESECTION Right 11/15/2014   Procedure: VIDEO ASSISTED THORACOSCOPY (VATS)/LUNG RESECTION WITH RIGHT LINGULECTOMY;  Surgeon: [name], MD;  Location: [place];  Service: Thoracic;  Laterality: Right;  Marland Kitchen VIDEO BRONCHOSCOPY N/A 11/15/2014   Procedure: VIDEO BRONCHOSCOPY;  Surgeon: [name], MD;  Location:  [facility] OR;  Service: Thoracic;  Laterality: N/A;     SOCIAL HISTORY:  Social History   Socioeconomic History  . Marital status: Married    Spouse name: Not on file  . Number of children: 2  . Years of education: Not on file  . Highest education level: Not on file  Occupational History  . Occupation: self-employed Regulatory affairs officer, currently not working    Fish farm manager: SELF EMPLOYED  Tobacco Use  . Smoking status: Never Smoker  . Smokeless tobacco: Never Used  Substance and Sexual Activity  . Alcohol use: No  . Drug use: No  . Sexual activity: Yes    Birth control/protection: None    Comment: married  Other Topics Concern  . Not on file  Social History Narrative  . Not on file   Social Determinants of Health   Financial Resource Strain: Low Risk   . Difficulty of Paying Living Expenses: Not very hard  Food Insecurity: No Food Insecurity  .  Worried About Charity fundraiser in the Last Year: Never true  . Ran Out of Food in the Last Year: Never true  Transportation Needs: No Transportation Needs  . Lack of Transportation (Medical): No  . Lack of Transportation (Non-Medical): No  Physical Activity: Inactive  . Days of Exercise per Week: 0 days  . Minutes of Exercise per Session: 0 min  Stress: No Stress Concern Present  . Feeling of Stress : Only a little  Social Connections: Unknown  . Frequency of Communication with Friends and Family: More than three times a week  . Frequency of Social Gatherings with Friends and Family: Once a week  . Attends Religious Services: Patient refused  . Active Member of Clubs or Organizations: Patient refused  . Attends Archivist Meetings: Patient refused  . Marital Status: Patient refused  Intimate Partner Violence: Unknown  . Fear of Current or Ex-Partner: Patient refused  . Emotionally Abused: Patient refused  . Physically Abused: Patient refused  . Sexually Abused: Patient refused    FAMILY HISTORY:  Family  History  Problem Relation Age of Onset  . Cancer Brother        throat  . Cancer Brother        prostate  . Colon cancer Neg Hx   . Liver disease Neg Hx   . Inflammatory bowel disease Neg Hx     CURRENT MEDICATIONS:  Outpatient Encounter Medications as of 04/20/2019  Medication Sig Note  . acetaminophen (TYLENOL) 500 MG tablet Take 1,000 mg by mouth every 6 (six) hours as needed for mild pain or moderate pain.    . clonazePAM (KLONOPIN) 1 MG tablet Take 1 tablet (1 mg total) by mouth 3 (three) times daily as needed.   . Diphenhyd-Hydrocort-Nystatin (FIRST-DUKES MOUTHWASH) SUSP Use as directed 5 mLs in the mouth or throat 4 (four) times daily. (Patient taking differently: Use as directed 5 mLs in the mouth or throat 2 (two) times daily. )   . gabapentin (NEURONTIN) 300 MG capsule TAKE 3 CAPSULES BY MOUTH TWICE DAILY (Patient taking differently: Take 900 mg by mouth 2 (two) times daily. )   . glimepiride (AMARYL) 2 MG tablet Take 2 mg by mouth 2 (two) times daily before a meal.    . ondansetron (ZOFRAN) 4 MG tablet Take 1 tablet (4 mg total) by mouth daily as needed for nausea or vomiting.   Marland Kitchen oxyCODONE-acetaminophen (PERCOCET) 10-325 MG tablet Take 1 tablet by mouth every 8 (eight) hours as needed for pain.   . pantoprazole (PROTONIX) 40 MG tablet Take 40 mg by mouth every evening.    . Probiotic Product (PROBIOTIC DAILY PO) Take 1 capsule by mouth 2 (two) times daily.    . rivaroxaban (XARELTO) 20 MG TABS tablet Take 1 tablet (20 mg total) by mouth daily with supper.   . vitamin B-12 (CYANOCOBALAMIN) 1000 MCG tablet Take 1,000 mcg by mouth daily. 03/10/2019: Not consistent in taking daily   . [DISCONTINUED] oxyCODONE-acetaminophen (PERCOCET) 10-325 MG tablet Take 1 tablet by mouth at bedtime as needed for pain.   Marland Kitchen dronabinol (MARINOL) 5 MG capsule Take 1 capsule (5 mg total) by mouth 2 (two) times daily before a meal.   . [DISCONTINUED] ondansetron (ZOFRAN) 8 MG tablet Take 1 tablet (8 mg  total) by mouth 2 (two) times daily as needed for refractory nausea / vomiting. Start on day 3 after chemotherapy. (Patient not taking: Reported on 04/07/2019)    Facility-Administered Encounter Medications as of  04/20/2019  Medication  . sodium chloride 0.9 % injection 10 mL  . sodium chloride flush (NS) 0.9 % injection 10 mL    ALLERGIES:  Allergies  Allergen Reactions  . Oxycodone     Blisters, hallucinations  . Tramadol     Blisters, hallucinations  . Trazodone And Nefazodone Other (See Comments)    hallucinations     PHYSICAL EXAM:  ECOG Performance status: 1  Vitals:   04/20/19 1100  BP: (!) 146/84  Pulse: 95  Resp: 18  Temp: (!) 97.5 F (36.4 C)  SpO2: 99%   Filed Weights   04/20/19 1100  Weight: 162 lb (73.5 kg)    Physical Exam Constitutional:      Appearance: Normal appearance.  HENT:     Head: Normocephalic.     Nose: Nose normal.     Mouth/Throat:     Mouth: Mucous membranes are moist.     Pharynx: Oropharynx is clear.  Eyes:     Extraocular Movements: Extraocular movements intact.     Conjunctiva/sclera: Conjunctivae normal.  Cardiovascular:     Rate and Rhythm: Regular rhythm. Tachycardia present.     Pulses: Normal pulses.     Heart sounds: Normal heart sounds.  Pulmonary:     Effort: Pulmonary effort is normal.     Breath sounds: Normal breath sounds.  Abdominal:     General: Abdomen is flat. Bowel sounds are normal.     Palpations: Abdomen is soft.  Musculoskeletal:        General: Normal range of motion.     Cervical back: Normal range of motion.  Skin:    General: Skin is warm and dry.  Neurological:     General: No focal deficit present.     Mental Status: He is alert and oriented to person, place, and time.  Psychiatric:        Mood and Affect: Mood normal.        Behavior: Behavior normal.        Thought Content: Thought content normal.        Judgment: Judgment normal.      LABORATORY DATA:  I have reviewed the labs as  listed.  CBC    Component Value Date/Time   WBC 5.6 04/07/2019 1059   RBC 4.46 04/07/2019 1059   HGB 12.2 (L) 04/07/2019 1059   HGB 11.6 (L) 02/04/2011 1059   HCT 40.9 04/07/2019 1059   HCT 34.7 (L) 02/04/2011 1059   PLT 248 04/07/2019 1059   PLT 282 02/04/2011 1059   MCV 91.7 04/07/2019 1059   MCV 76.0 (L) 02/04/2011 1059   MCH 27.4 04/07/2019 1059   MCHC 29.8 (L) 04/07/2019 1059   RDW 15.9 (H) 04/07/2019 1059   RDW 18.5 (H) 02/04/2011 1059   LYMPHSABS 1.4 04/07/2019 1059   LYMPHSABS 1.1 02/04/2011 1059   MONOABS 0.3 04/07/2019 1059   MONOABS 0.5 02/04/2011 1059   EOSABS 0.3 04/07/2019 1059   EOSABS 0.1 02/04/2011 1059   BASOSABS 0.0 04/07/2019 1059   BASOSABS 0.1 02/04/2011 1059   CMP Latest Ref Rng & Units 04/07/2019 03/24/2019 03/13/2019  Glucose 70 - 99 mg/dL 242(H) 223(H) 168(H)  BUN 8 - 23 mg/dL '9 10 13  ' Creatinine 0.61 - 1.24 mg/dL 0.73 0.71 0.71  Sodium 135 - 145 mmol/L 140 138 132(L)  Potassium 3.5 - 5.1 mmol/L 4.1 5.0 4.1  Chloride 98 - 111 mmol/L 103 102 98  CO2 22 - 32 mmol/L '26 26 23  ' Calcium  8.9 - 10.3 mg/dL 9.0 9.1 8.3(L)  Total Protein 6.5 - 8.1 g/dL 6.6 6.4(L) -  Total Bilirubin 0.3 - 1.2 mg/dL 0.8 0.5 -  Alkaline Phos 38 - 126 U/L 103 81 -  AST 15 - 41 U/L 17 16 -  ALT 0 - 44 U/L 10 13 -    I have personally reviewed the scans and discussed with the patient.   ASSESSMENT & PLAN:   Rectal cancer ([place]) 1.  Stage IV rectal cancer with lung and pancreatic metastasis: [facility] 1 CDX shows KRAS G13D and NRAS wild type, MS-stable. -12 cycles of FOLFOX in the adjuvant setting from October 12 through 07/14/2011. -15 cycles of FOLFOX and bevacizumab from 07/08/2016 through 01/20/2017 followed by infusional 5-FU and bevacizumab until 07/14/2017. -Maintenance Xeloda from 08/07/2017 with bevacizumab through 08/19/2018 with progression. - ERCP and stent placement on 09/16/2018 showing single localized biliary stricture.  Biliary sphincterotomy was performed with  stricture dilated to 6 mm with balloon to facilitate stent placement.  1 covered metal stent was placed. -Flexible sigmoidoscopy on 09/24/2018 showing narrowing of the colorectal anastomosis.  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 - 2 cycles of FOLFIRI from 10/04/2018 through 10/25/2018 with 20% dose reduction during cycle 2. - He has developed severe tiredness after cycle 2 even after dose reduction. 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  -Cycle 8 of chemotherapy with FOLFIRI and bevacizumab on 02/09/2019. -He was admitted to the hospital from 02/22/2019-02/26/2019 with perforated appendicitis which was managed conservatively. -He was readmitted on 03/10/2019 through 03/14/2019.  He had similar symptoms with right lower quadrant pain.  A pigtail drain was placed on 03/12/2019 by IR.  Drain discontinued on March 22, 2019. -CEA on March 24, 2019 increased to 10. -We reviewed results of the CT CAP from April 19, 2019 which showed slight interval enlargement of a subpleural nodule of the posterior right upper lobe, measuring 1.2 cm, previously 1.0 cm.  Interval enlargement of a subpleural nodule of the dependent right lower lobe measuring 1.1, previously 0.7.  New hypodense inferior right lobe of the liver lesion measuring 8 mm suspicious for mets.  Pancreatic head mass measures 3.1 x 2.3 cm, previously 2.6 x 2.1 cm.  Sclerotic lesion of the posterior right 10th rib, with increasing adjacent soft tissue component. -As he has progressive disease, I recommended restarting chemotherapy.  Upon further discussion, he would like to start after holidays.  We will see him back in 3 weeks to initiate his chemotherapy.  We will initially start with FOLFIRI and see how he tolerates it.  We will plan to add bevacizumab after second or third cycle.   2.  Weight loss: -His weight is more or less stable at 162 in the  last 2 weeks, but he has lost weight prior to that. -He is drinking 4 to 5 cans of boost per day.  Eating small portions. -We have sent prescription for Megace 5 mg twice daily for appetite stimulation.  3.  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  4.  Anxiety: -He will continue Klonopin 1 mg twice daily.  5.  Right-sided rib pain: -He reports pain in the right posterior rib region which often radiates to the front of the abdomen. -We will start him on Percocet 10/325 mg to be taken every 8 hours as needed.  This is from metastatic  site.    Orders placed this encounter:  No orders of the defined types were placed in this encounter.     Derek Jack, MD  [place] [phone]

## 2019-05-11 ENCOUNTER — Inpatient Hospital Stay (HOSPITAL_COMMUNITY): Payer: Medicare Other | Attending: Hematology

## 2019-05-11 ENCOUNTER — Inpatient Hospital Stay (HOSPITAL_BASED_OUTPATIENT_CLINIC_OR_DEPARTMENT_OTHER): Payer: Medicare Other | Admitting: Hematology

## 2019-05-11 ENCOUNTER — Other Ambulatory Visit: Payer: Self-pay

## 2019-05-11 ENCOUNTER — Inpatient Hospital Stay (HOSPITAL_COMMUNITY): Payer: Medicare Other

## 2019-05-11 ENCOUNTER — Encounter (HOSPITAL_COMMUNITY): Payer: Self-pay | Admitting: Hematology

## 2019-05-11 VITALS — BP 137/78 | HR 92 | Temp 97.1°F | Resp 18 | Wt 164.3 lb

## 2019-05-11 VITALS — BP 130/60 | HR 70 | Temp 96.8°F | Resp 18

## 2019-05-11 DIAGNOSIS — R634 Abnormal weight loss: Secondary | ICD-10-CM | POA: Insufficient documentation

## 2019-05-11 DIAGNOSIS — C20 Malignant neoplasm of rectum: Secondary | ICD-10-CM

## 2019-05-11 DIAGNOSIS — Z79899 Other long term (current) drug therapy: Secondary | ICD-10-CM | POA: Diagnosis not present

## 2019-05-11 DIAGNOSIS — C7889 Secondary malignant neoplasm of other digestive organs: Secondary | ICD-10-CM | POA: Insufficient documentation

## 2019-05-11 DIAGNOSIS — C7800 Secondary malignant neoplasm of unspecified lung: Secondary | ICD-10-CM

## 2019-05-11 DIAGNOSIS — G620 Drug-induced polyneuropathy: Secondary | ICD-10-CM | POA: Diagnosis not present

## 2019-05-11 DIAGNOSIS — Z5189 Encounter for other specified aftercare: Secondary | ICD-10-CM | POA: Insufficient documentation

## 2019-05-11 DIAGNOSIS — T451X5A Adverse effect of antineoplastic and immunosuppressive drugs, initial encounter: Secondary | ICD-10-CM

## 2019-05-11 DIAGNOSIS — C7801 Secondary malignant neoplasm of right lung: Secondary | ICD-10-CM | POA: Insufficient documentation

## 2019-05-11 DIAGNOSIS — Z5111 Encounter for antineoplastic chemotherapy: Secondary | ICD-10-CM | POA: Insufficient documentation

## 2019-05-11 DIAGNOSIS — F419 Anxiety disorder, unspecified: Secondary | ICD-10-CM | POA: Diagnosis not present

## 2019-05-11 DIAGNOSIS — E860 Dehydration: Secondary | ICD-10-CM

## 2019-05-11 DIAGNOSIS — R112 Nausea with vomiting, unspecified: Secondary | ICD-10-CM

## 2019-05-11 LAB — CBC WITH DIFFERENTIAL/PLATELET
Abs Immature Granulocytes: 0.01 10*3/uL (ref 0.00–0.07)
Basophils Absolute: 0 10*3/uL (ref 0.0–0.1)
Basophils Relative: 1 %
Eosinophils Absolute: 0.2 10*3/uL (ref 0.0–0.5)
Eosinophils Relative: 3 %
HCT: 38.3 % — ABNORMAL LOW (ref 39.0–52.0)
Hemoglobin: 11.4 g/dL — ABNORMAL LOW (ref 13.0–17.0)
Immature Granulocytes: 0 %
Lymphocytes Relative: 27 %
Lymphs Abs: 1.8 10*3/uL (ref 0.7–4.0)
MCH: 26.6 pg (ref 26.0–34.0)
MCHC: 29.8 g/dL — ABNORMAL LOW (ref 30.0–36.0)
MCV: 89.5 fL (ref 80.0–100.0)
Monocytes Absolute: 0.4 10*3/uL (ref 0.1–1.0)
Monocytes Relative: 7 %
Neutro Abs: 4 10*3/uL (ref 1.7–7.7)
Neutrophils Relative %: 62 %
Platelets: 261 10*3/uL (ref 150–400)
RBC: 4.28 MIL/uL (ref 4.22–5.81)
RDW: 15.5 % (ref 11.5–15.5)
WBC: 6.4 10*3/uL (ref 4.0–10.5)
nRBC: 0 % (ref 0.0–0.2)

## 2019-05-11 LAB — COMPREHENSIVE METABOLIC PANEL
ALT: 17 U/L (ref 0–44)
AST: 25 U/L (ref 15–41)
Albumin: 2.9 g/dL — ABNORMAL LOW (ref 3.5–5.0)
Alkaline Phosphatase: 141 U/L — ABNORMAL HIGH (ref 38–126)
Anion gap: 10 (ref 5–15)
BUN: 13 mg/dL (ref 8–23)
CO2: 25 mmol/L (ref 22–32)
Calcium: 8.6 mg/dL — ABNORMAL LOW (ref 8.9–10.3)
Chloride: 103 mmol/L (ref 98–111)
Creatinine, Ser: 0.66 mg/dL (ref 0.61–1.24)
GFR calc Af Amer: >60 mL/min (ref >60)
GFR calc non Af Amer: >60 mL/min (ref >60)
Glucose, Bld: 189 mg/dL — ABNORMAL HIGH (ref 70–99)
Potassium: 4 mmol/L (ref 3.5–5.1)
Sodium: 138 mmol/L (ref 135–145)
Total Bilirubin: 1 mg/dL (ref 0.3–1.2)
Total Protein: 6.1 g/dL — ABNORMAL LOW (ref 6.5–8.1)

## 2019-05-11 LAB — URINALYSIS, DIPSTICK ONLY
Bilirubin Urine: NEGATIVE
Glucose, UA: NEGATIVE mg/dL
Hgb urine dipstick: NEGATIVE
Ketones, ur: NEGATIVE mg/dL
Leukocytes,Ua: NEGATIVE
Nitrite: NEGATIVE
Protein, ur: NEGATIVE mg/dL
Specific Gravity, Urine: 1.027 (ref 1.005–1.030)
pH: 5 (ref 5.0–8.0)

## 2019-05-11 LAB — MAGNESIUM: Magnesium: 1.5 mg/dL — ABNORMAL LOW (ref 1.7–2.4)

## 2019-05-11 MED ORDER — SODIUM CHLORIDE 0.9 % IV SOLN: Freq: Once | INTRAVENOUS | Status: AC

## 2019-05-11 MED ORDER — SODIUM CHLORIDE 0.9 % IV SOLN
Freq: Once | INTRAVENOUS | Status: AC
  Filled 2019-05-11: qty 1000

## 2019-05-11 MED ORDER — SODIUM CHLORIDE 0.9 % IV SOLN
180.0000 mg | Freq: Once | INTRAVENOUS | Status: AC
  Administered 2019-05-11: 180 mg via INTRAVENOUS
  Filled 2019-05-11: qty 9

## 2019-05-11 MED ORDER — SODIUM CHLORIDE 0.9% FLUSH
10.0000 mL | INTRAVENOUS | Status: DC | PRN
  Administered 2019-05-11: 10 mL

## 2019-05-11 MED ORDER — GABAPENTIN 300 MG PO CAPS: 900.0000 mg | ORAL_CAPSULE | Freq: Two times a day (BID) | ORAL | 2 refills | Status: DC

## 2019-05-11 MED ORDER — PALONOSETRON HCL INJECTION 0.25 MG/5ML
INTRAVENOUS | Status: AC
  Filled 2019-05-11: qty 5

## 2019-05-11 MED ORDER — SODIUM CHLORIDE 0.9 % IV SOLN
1920.0000 mg/m2 | INTRAVENOUS | Status: DC
  Administered 2019-05-11: 4100 mg via INTRAVENOUS
  Filled 2019-05-11: qty 82

## 2019-05-11 MED ORDER — PALONOSETRON HCL INJECTION 0.25 MG/5ML
0.2500 mg | Freq: Once | INTRAVENOUS | Status: AC
  Administered 2019-05-11: 0.25 mg via INTRAVENOUS

## 2019-05-11 MED ORDER — MAGNESIUM OXIDE 400 (241.3 MG) MG PO TABS: 400.0000 mg | ORAL_TABLET | Freq: Two times a day (BID) | ORAL | 1 refills | Status: DC

## 2019-05-11 MED ORDER — ATROPINE SULFATE 1 MG/ML IJ SOLN
0.5000 mg | Freq: Once | INTRAMUSCULAR | Status: AC | PRN
  Administered 2019-05-11: 0.5 mg via INTRAVENOUS
  Filled 2019-05-11: qty 1

## 2019-05-11 MED ORDER — SODIUM CHLORIDE 0.9 % IV SOLN
Freq: Once | INTRAVENOUS | Status: AC
  Filled 2019-05-11: qty 5

## 2019-05-11 MED ORDER — FLUOROURACIL CHEMO INJECTION 2.5 GM/50ML
320.0000 mg/m2 | Freq: Once | INTRAVENOUS | Status: AC
  Administered 2019-05-11: 700 mg via INTRAVENOUS
  Filled 2019-05-11: qty 14

## 2019-05-11 MED ORDER — SODIUM CHLORIDE 0.9 % IV SOLN
327.0000 mg/m2 | Freq: Once | INTRAVENOUS | Status: AC
  Administered 2019-05-11: 700 mg via INTRAVENOUS
  Filled 2019-05-11: qty 35

## 2019-05-11 NOTE — Patient Instructions (Addendum)
[facility] at [facility] Discharge Instructions  You were seen today by Dr. Katragadda. He went over your recent lab results. He will see you back in 2 weeks for labs and follow up.   Thank you for choosing [facility] at [facility] to provide your oncology and hematology care.  To afford each patient quality time with our provider, please arrive at least 15 minutes before your scheduled appointment time.   If you have a lab appointment with the Cancer Center please come in thru the  Main Entrance and check in at the main information desk  You need to re-schedule your appointment should you arrive 10 or more minutes late.  We strive to give you quality time with our providers, and arriving late affects you and other patients whose appointments are after yours.  Also, if you no show three or more times for appointments you may be dismissed from the clinic at the providers discretion.     Again, thank you for choosing [facility].  Our hope is that these requests will decrease the amount of time that you wait before being seen by our physicians.       _____________________________________________________________  Should you have questions after your visit to [facility], please contact our office at [phone] between the hours of 8:00 a.m. and 4:30 p.m.  Voicemails left after 4:00 p.m. will not be returned until the following business day.  For prescription refill requests, have your pharmacy contact our office and allow 72 hours.    Cancer Center Support Programs:   > Cancer Support Group  2nd Tuesday of the month 1pm-2pm, Journey Room    

## 2019-05-11 NOTE — Patient Instructions (Signed)
[facility] Discharge Instructions for Patients Receiving Chemotherapy  Today you received the following chemotherapy agents   To help prevent nausea and vomiting after your treatment, we encourage you to take your nausea medication   If you develop nausea and vomiting that is not controlled by your nausea medication, call the clinic.   BELOW ARE SYMPTOMS THAT SHOULD BE REPORTED IMMEDIATELY:  *FEVER GREATER THAN 100.5 F  *CHILLS WITH OR WITHOUT FEVER  NAUSEA AND VOMITING THAT IS NOT CONTROLLED WITH YOUR NAUSEA MEDICATION  *UNUSUAL SHORTNESS OF BREATH  *UNUSUAL BRUISING OR BLEEDING  TENDERNESS IN MOUTH AND THROAT WITH OR WITHOUT PRESENCE OF ULCERS  *URINARY PROBLEMS  *BOWEL PROBLEMS  UNUSUAL RASH Items with * indicate a potential emergency and should be followed up as soon as possible.  Feel free to call the clinic should you have any questions or concerns. The clinic phone number is [phone].  Please show the CHEMO ALERT CARD at check-in to the Emergency Department and triage nurse.   

## 2019-05-11 NOTE — Progress Notes (Signed)
05/11/19  Adjust dose of irinotecan due to >10% weight loss.  New dose 90 mg/m2 = 180 mg   Leucovorin and Fluorouracil doses are within 10% rule - no adjustment necessary at this time.  T.O. Dr Rhys Martini, PharmD

## 2019-05-11 NOTE — Progress Notes (Signed)
Labs reviewed with MD today. Will give treatment per MD and will give hydration fluids as well per MD.   Treatment given per orders. Patient tolerated it well without problems. Vitals stable and discharged home from clinic ambulatory. Follow up as scheduled.

## 2019-05-11 NOTE — Progress Notes (Signed)
Charles [name], [place] [id number]   CLINIC:  Medical Oncology/Hematology  PCP:  [name], MD [place]  [id number] [phone]   REASON FOR VISIT:  Follow-up for Stage IV Colon Cancer  CURRENT THERAPY: FOLFIRI and bevacizumab on hold  BRIEF ONCOLOGIC HISTORY:  Oncology History  Rectal cancer ([place])  07/24/2010 Initial Diagnosis   Invasive adenocarcinoma of rectum   09/09/2010 Concurrent Chemotherapy   S/P radiation and concurrent 5-FU continuous infusion from 09/09/10- 10/10/10.   11/14/2010 Surgery   S/P proctectomy with colorectal anastomosis and diverting loop ileostomy on 11/14/10 at [facility] by Dr. Harlon Ditty. Pathology reveals a pT3b N1 with 3/20 lymph nodes.   02/05/2011 - 07/14/2011 Chemotherapy   FOLFOX   08/18/2011 Surgery   Approximate date of surgery- [place] by Dr. Harlon Ditty    Remission     11/24/2013 Survivorship   Colonoscopy- somewhat fibrotic/friable anastomotic mucosal-status post biopsy (narrowing not felt to be clinically significant).  Negative pathology for malignancy   09/12/2014 Imaging   DVT in the left femoral venous system, left common iliac vein, IVC, and within the IVC filter Right lower extremity venography confirms chronic occlusion of the femoral venous system with collateralization. The right iliac venous system is patent and do   09/25/2014 PET scan   The right middle lobe pulmonary nodule is hypermetabolic, favored to represent a primary bronchogenic carcinoma.Equivocal mediastinal nodes, similar to surrounding blood pool. Bilateral adrenal hypermetabolism, felt to be physiologic   10/24/2014 Pathology Results   Lung, needle/core biopsy(ies), RML - ADENOCARCINOMA, SEE COMMENT metastatic adenocarcinoma of a colorectal primary   10/24/2014 Relapse/Recurrence     11/16/2014 Definitive Surgery   Bronchoscopy, right video-assisted thoracoscopy, wedge resection of right middle lobe by Dr.  Servando Snare   11/16/2014 Pathology Results   Lung, wedge biopsy/resection, right lingula and small portion of middle lobe - METASTATIC ADENOCARCINOMA, CONSISTENT WITH COLORECTAL PRIMARY, SPANNING 2.0 CM. - THE SURGICAL RESECTION MARGINS ARE NEGATIVE FOR ADENOCARCINOMA.   11/16/2014 Remission   THE SURGICAL RESECTION MARGINS ARE NEGATIVE FOR ADENOCARCINOMA.   03/07/2015 Imaging   CT CAP- Interval resection of right middle lobe metastasis. No acute process or evidence of metastatic disease in the chest, abdomen or pelvis. Improved right upper lobe reticular nodular opacities are favored to represent resolving infection.   09/14/2015 Imaging   CT CAP- No findings of recurrent malignancy. No recurrence along the wedge resection site of the right middle lobe. Right anterior abdominal wall focal hernia containing a knuckle of small bowel without complicating feature.   06/13/2016 Imaging   CT CAP- 1. New right-sided pleural metastasis/mass. Other smaller right-sided pulmonary nodules which are all pleural-based and most likely represent pleural metastasis. 2. No convincing evidence of abdominopelvic nodal metastasis. 3. Constellation of findings, including pancreatic atrophy, duct dilatation, and pancreatic head soft tissue fullness which are highly suspicious for pancreatic adenocarcinoma. Metastatic disease felt much less likely. Consider endoscopic ultrasound sampling or ERCP. Cannot exclude superimposed acute pancreatitis. 4. New enlargement of the appendiceal tip with subtle surrounding edema. Cannot exclude early or mild appendicitis. 5. Coronary artery atherosclerosis. Aortic atherosclerosis. 6. 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   06/13/2016 Progression   CT scan demonstrates progression of disease   06/19/2016 Procedure   EUS with FNA by Dr. Ardis Hughs   06/20/2016 Pathology Results   FINE NEEDLE ASPIRATION, ENDOSCOPIC, PANCREAS UNCINATE AREA(SPECIMEN 1 OF 1  COLLECTED 06/19/16): MALIGNANT CELLS CONSISTENT WITH  METASTATIC ADENOCARCINOMA.   06/25/2016 PET scan   1. Two pleural-based nodules in the right hemithorax are hypermetabolic. Metastatic disease is a distinct consideration. The scattered pulmonary parenchymal nodule seen on previous diagnostic CT imaging are below the threshold for reliable resolution on PET imaging. 2. Hypermetabolic lesion pancreatic head, consistent with neoplasm. As noted on prior CT, adenocarcinoma is any consideration. No evidence for hypermetabolic abdominal lymphadenopathy. Mottled uptake noted in the liver, but no discrete hepatic metastases are evident on PET imaging. 3. Appendix remains distended up to 0.9-10 mm diameter with a stone towards the tip in subtle periappendiceal edema/inflammation. The appendix is hypermetabolic along its length. Imaging features are relatively stable in the 12 day interval since prior CT scan. While appendicitis is a consideration, the relative stability over 12 days would be unusual for that etiology. Continued close follow-up recommended.   06/26/2016 Pathology Results   Not enough tissue for foundationONE or K-ras testing.   07/07/2016 Procedure   Port placed by Dr. Arnoldo Morale   07/08/2016 -  Chemotherapy   The patient had pegfilgrastim (NEULASTA) injection 6 mg, 6 mg, Subcutaneous,  Once, 0 of 4 cycles  ondansetron (ZOFRAN) IVPB 8 mg, 8 mg, Intravenous,  Once, 0 of 4 cycles  leucovorin 804 mg in dextrose 5 % 250 mL infusion, 400 mg/m2, Intravenous,  Once, 0 of 4 cycles  oxaliplatin (ELOXATIN) 170 mg in dextrose 5 % 500 mL chemo infusion, 85 mg/m2, Intravenous,  Once, 0 of 4 cycles  fluorouracil (ADRUCIL) 4,800 mg in sodium chloride 0.9 % 150 mL chemo infusion, 2,400 mg/m2 = 4,800 mg, Intravenous, 1 Day/Dose, 0 of 4 cycles  fluorouracil (ADRUCIL) chemo injection 800 mg, 400 mg/m2, Intravenous,  Once, 0 of 4 cycles  pegfilgrastim (NEULASTA) injection 6 mg, 6 mg,  Subcutaneous,  Once, 2 of 2 cycles  ondansetron (ZOFRAN) IVPB 8 mg, 8 mg, Intravenous,  Once, 12 of 12 cycles  leucovorin 660 mg in dextrose 5 % 250 mL infusion, 660 mg (original dose ), Intravenous,  Once, 12 of 12 cycles Dose modification: 660 mg (Cycle 1)  oxaliplatin (ELOXATIN) 140 mg in dextrose 5 % 500 mL chemo infusion, 140 mg (original dose ), Intravenous,  Once, 12 of 12 cycles Dose modification: 140 mg (Cycle 1), 119 mg (85 % of original dose 140 mg, Cycle 8, Reason: Provider Judgment)  fluorouracil (ADRUCIL) 3,850 mg in sodium chloride 0.9 % 150 mL chemo infusion, 3,850 mg (original dose ), Intravenous, 1 Day/Dose, 12 of 12 cycles Dose modification: 3,850 mg (Cycle 1)  fluorouracil (ADRUCIL) chemo injection 700 mg, 700 mg (original dose ), Intravenous,  Once, 12 of 12 cycles Dose modification: 700 mg (Cycle 1)  palonosetron (ALOXI) injection 0.25 mg, 0.25 mg, Intravenous,  Once, 7 of 12 cycles Administration: 0.25 mg (09/30/2016)  bevacizumab (AVASTIN) 475 mg in sodium chloride 0.9 % 100 mL chemo infusion, 5 mg/kg = 475 mg, Intravenous,  Once, 6 of 11 cycles Administration: 500 mg (09/30/2016)  leucovorin 880 mg in dextrose 5 % 250 mL infusion, 400 mg/m2 = 880 mg, Intravenous,  Once, 7 of 12 cycles Administration: 880 mg (09/30/2016)  oxaliplatin (ELOXATIN) 185 mg in dextrose 5 % 500 mL chemo infusion, 85 mg/m2 = 185 mg, Intravenous,  Once, 7 of 12 cycles Administration: 185 mg (09/30/2016)  fluorouracil (ADRUCIL) chemo injection 900 mg, 400 mg/m2 = 900 mg, Intravenous,  Once, 7 of 12 cycles Administration: 900 mg (09/30/2016)  fluorouracil (ADRUCIL) 5,300 mg in sodium chloride 0.9 % 144 mL chemo infusion,  2,400 mg/m2 = 5,300 mg, Intravenous, 1 Day/Dose, 7 of 12 cycles Administration: 5,300 mg (09/30/2016)  for chemotherapy treatment.     08/27/2016 Imaging   CT CAP- 1. Pulmonary metastatic disease is stable. 2. Pancreatic head mass, grossly stable, with progressive atrophy  of the body and tail of the pancreas. Stable peripancreatic lymph node. 3. Mild circumferential rectal wall thickening, stable. 4. Aortic atherosclerosis (ICD10-170.0). Coronary artery calcification. 5. Hyperattenuating lesion off the lower pole left kidney, stable, too small to characterize. Continued attention on followup exams is warranted. 6. Persistent wall thickening and mild dilatation of the proximal appendix, of uncertain etiology. Continued attention on followup exams is warranted.   11/06/2016 Imaging   CT C/A/P: IMPRESSION: 1. Stable pulmonary metastatic disease. 2. Similar appearing pancreatic head mass. Progressed atrophy of the pancreatic body and tail. Stable peripancreatic lymph node. 3. Stable hypoattenuating lesion partially exophytic off the inferior pole of the left kidney. 4. Aortic atherosclerosis. 5. Persistent mild wall thickening and mild dilatation of the appendix.    01/30/2017 Imaging   Ct C/A/P: IMPRESSION: 1. Stable pulmonary metastatic disease. 2. Stable to slight increase in size of pancreatic head mass. 3.  Aortic Atherosclerosis (ICD10-I70.0). 4. Similar appearance of mild wall thickening of the appendix which contains an appendicolith.   05/01/2017 Imaging   CT C/A/P: Stable disease in the lungs, no progression of disease in the abdomen.   10/04/2018 -  Chemotherapy   The patient had palonosetron (ALOXI) injection 0.25 mg, 0.25 mg, Intravenous,  Once, 9 of 12 cycles Administration: 0.25 mg (10/04/2018), 0.25 mg (10/25/2018), 0.25 mg (12/01/2018), 0.25 mg (12/15/2018), 0.25 mg (12/29/2018), 0.25 mg (01/12/2019), 0.25 mg (01/26/2019), 0.25 mg (02/09/2019), 0.25 mg (05/11/2019) pegfilgrastim (NEULASTA) injection 6 mg, 6 mg, Subcutaneous, Once, 2 of 2 cycles Administration: 6 mg (10/27/2018) pegfilgrastim-cbqv (UDENYCA) injection 6 mg, 6 mg, Subcutaneous, Once, 7 of 10 cycles Administration: 6 mg (12/03/2018), 6 mg (12/17/2018), 6 mg (12/31/2018), 6 mg  (01/14/2019), 6 mg (01/28/2019), 6 mg (02/11/2019), 6 mg (05/13/2019) bevacizumab (AVASTIN) 450 mg in sodium chloride 0.9 % 100 mL chemo infusion, 5 mg/kg = 450 mg, Intravenous,  Once, 4 of 4 cycles Administration: 400 mg (12/01/2018), 400 mg (12/15/2018), 400 mg (12/29/2018) irinotecan (CAMPTOSAR) 380 mg in sodium chloride 0.9 % 500 mL chemo infusion, 180 mg/m2 = 380 mg, Intravenous,  Once, 9 of 12 cycles Dose modification: 144 mg/m2 (original dose 180 mg/m2, Cycle 2, Reason: Provider Judgment, Comment: 20% dose reduction), 90 mg/m2 (50 % of original dose 180 mg/m2, Cycle 3, Reason: Other (see comments), Comment: [id number]) Administration: 380 mg (10/04/2018), 300 mg (10/25/2018), 200 mg (12/01/2018), 200 mg (12/15/2018), 200 mg (12/29/2018), 200 mg (01/12/2019), 200 mg (01/26/2019), 200 mg (02/09/2019), 180 mg (05/11/2019) leucovorin 900 mg in sodium chloride 0.9 % 250 mL infusion, 421 mg/m2 = 856 mg, Intravenous,  Once, 9 of 12 cycles Dose modification: 320 mg/m2 (original dose 400 mg/m2, Cycle 2, Reason: Provider Judgment, Comment: 20% dose reduction) Administration: 900 mg (10/04/2018), 700 mg (10/25/2018), 700 mg (12/01/2018), 700 mg (12/15/2018), 700 mg (12/29/2018), 700 mg (01/12/2019), 700 mg (01/26/2019), 700 mg (02/09/2019), 700 mg (05/11/2019) fluorouracil (ADRUCIL) chemo injection 850 mg, 400 mg/m2 = 850 mg, Intravenous,  Once, 9 of 12 cycles Dose modification: 320 mg/m2 (original dose 400 mg/m2, Cycle 2, Reason: Provider Judgment, Comment: 20% dose reduction) Administration: 850 mg (10/04/2018), 700 mg (10/25/2018), 700 mg (12/01/2018), 700 mg (12/15/2018), 700 mg (12/29/2018), 700 mg (01/12/2019), 700 mg (01/26/2019), 700 mg (02/09/2019), 700 mg (05/11/2019) fosaprepitant (EMEND)  150 mg, dexamethasone (DECADRON) 12 mg in sodium chloride 0.9 % 145 mL IVPB, , Intravenous,  Once, 7 of 10 cycles Administration:  (12/01/2018),  (12/15/2018),  (12/29/2018),  (01/12/2019),  (01/26/2019),  (02/09/2019),  (05/11/2019) fluorouracil (ADRUCIL) 5,000 mg  in sodium chloride 0.9 % 150 mL chemo infusion, 2,325 mg/m2 = 5,150 mg, Intravenous, 1 Day/Dose, 9 of 12 cycles Dose modification: 1,920 mg/m2 (original dose 2,400 mg/m2, Cycle 2, Reason: Provider Judgment, Comment: 20% dose reduction) Administration: 5,000 mg (10/04/2018), 4,100 mg (10/25/2018), 4,100 mg (12/01/2018), 4,100 mg (12/15/2018), 4,100 mg (12/29/2018), 4,100 mg (01/12/2019), 4,100 mg (01/26/2019), 4,100 mg (02/09/2019), 4,100 mg (05/11/2019) bevacizumab-bvzr (ZIRABEV) 400 mg in sodium chloride 0.9 % 100 mL chemo infusion, 5 mg/kg = 400 mg (100 % of original dose 5 mg/kg), Intravenous,  Once, 3 of 6 cycles Dose modification: 5 mg/kg (original dose 5 mg/kg, Cycle 6) Administration: 400 mg (01/12/2019), 400 mg (01/26/2019), 400 mg (02/09/2019)  for chemotherapy treatment.       CANCER STAGING: Cancer Staging Rectal cancer ([place]) Staging form: Colon and Rectum, AJCC 7th Edition - Clinical: Stage IIIB (T3, N1, M0) - Signed by [name], MD on 02/04/2011 - Pathologic stage from 11/17/2014: Stage IVA (M1a) - Signed by Baird Cancer, PA-C on 09/14/2015    INTERVAL HISTORY:  Mr. Proch 69 y.o. male seen for follow-up of metastatic rectal cancer.  He reports worsening of the right posterior rib pain predominantly at nighttime.  During daytime oxycodone 10/325 will help.  Is also taking gabapentin for neuropathy.  Klonopin is helping with her anxiety.  Appetite and energy levels are 100%.  He gained about 2 pounds since last visit.  He requests refill for gabapentin.  REVIEW OF SYSTEMS:  Review of Systems  Neurological: Positive for numbness.  All other systems reviewed and are negative.    PAST MEDICAL/SURGICAL HISTORY:  Past Medical History:  Diagnosis Date  . Chronic anticoagulation   . Colon cancer ([place]) 07/24/2010   rectal ca, inv adenocarcinoma  . Depression 04/01/2011  . Diabetes mellitus without complication ([place])   . DVT (deep venous thrombosis) ([place]) 05/09/2011  . GERD  (gastroesophageal reflux disease)   . High output ileostomy ([place]) 05/21/2011  . History of kidney stones   . HTN (hypertension)   . Hx of radiation therapy 09/02/10 to 10/14/10   pelvis  . Lung metastasis ([place])   . Neuropathy   . Peripheral vascular disease ([place])    dvt's,pe  . Pneumonia    hx x3  . Pulmonary embolism ([place])   . Rectal cancer ([place]) 01/15/2011   S/P radiation and concurrent 5-FU continuous infusion from 09/09/10- 10/10/10.  S/P proctectomy with colorectal anastomosis and diverting loop ileostomy on 11/14/10 at [facility] by Dr. Harlon Ditty. Pathology reveals a pT3b N1 with 3/20 lymph nodes.     Past Surgical History:  Procedure Laterality Date  . BILIARY STENT PLACEMENT N/A 09/16/2018   Procedure: STENT PLACEMENT;  Surgeon: Rogene Houston, MD;  [facility];  Service: Endoscopy;  Laterality: N/A;  . BIOPSY  09/24/2018   Procedure: BIOPSY;  Surgeon: Danie Binder, MD;  [facility];  Service: Endoscopy;;  . COLON SURGERY  11/14/2010   proctectomy with colorectal anastomosis and diverting loop ileostomy (temporary planned)  . COLONOSCOPY  07/2010   proximal rectal apple core mass 10-14cm from anal verge (adenocarcinoma), 2-3cm distal rectal carpet polyp s/p piecemeal snare polypectomy (adenoma)  . COLONOSCOPY  04/21/2012   RMR: Friable,fibrotic appearing  colorectal anastomosis producing some luminal narrowing-not felt to be critical. path: focal erosion with slight inflammation and hyperemia. SURVEILLANCE DUE DEC 2015  . COLONOSCOPY N/A 11/24/2013   Dr. Rourk:somewhat fibrotic/friable anastomotic mucosal-status post biopsy (narrowing not felt to be clinically significant) Single colonic diverticulum. benign polypoid rectal mucosa  . colostomy reversal  april 2013  . ERCP N/A 09/16/2018   Procedure: ENDOSCOPIC RETROGRADE CHOLANGIOPANCREATOGRAPHY (ERCP);  Surgeon: Rogene Houston, MD;  [facility];  Service: Endoscopy;  Laterality: N/A;  .  ESOPHAGOGASTRODUODENOSCOPY  07/2010   RMR: schatki ring s/p dilation, small hh, SB bx benign  . ESOPHAGOGASTRODUODENOSCOPY N/A 09/04/2015   Procedure: ESOPHAGOGASTRODUODENOSCOPY (EGD);  Surgeon: Daneil Dolin, MD;  [facility];  Service: Endoscopy;  Laterality: N/A;  730  . EUS  08/2010   Dr. Owens Loffler. uT3N0 circumferential, nearly obstruction rectosigmoid adenocarcinoma, distal edge 12cm from anal verge  . EUS N/A 06/19/2016   Procedure: UPPER ENDOSCOPIC ULTRASOUND (EUS) RADIAL;  Surgeon: Milus Banister, MD;  [facility];  Service: Endoscopy;  Laterality: N/A;  . FLEXIBLE SIGMOIDOSCOPY N/A 09/24/2018   Procedure: FLEXIBLE SIGMOIDOSCOPY;  Surgeon: Danie Binder, MD;  [facility];  Service: Endoscopy;  Laterality: N/A;  . HERNIA REPAIR     abd hernia repair  . IVC filter    . ivc filter    . port a cath placement    . PORT-A-CATH REMOVAL  09/24/2011   Procedure: REMOVAL PORT-A-CATH;  Surgeon: Donato Heinz, MD;  Location: [facility];  Service: General;  Laterality: N/A;  Minor Room  . PORTACATH PLACEMENT Right 07/07/2016   Procedure: INSERTION PORT-A-CATH;  Surgeon: Aviva Signs, MD;  Location: [facility];  Service: General;  Laterality: Right;  . SPHINCTEROTOMY N/A 09/16/2018   Procedure: SPHINCTEROTOMY with balloon dialation;  Surgeon: Rogene Houston, MD;  [facility];  Service: Endoscopy;  Laterality: N/A;  . TRANSVERSE LOOP COLOSTOMY N/A 09/29/2018   Procedure: TRANSVERSE LOOP COLOSTOMY;  Surgeon: Aviva Signs, MD;  Location: [facility];  Service: General;  Laterality: N/A;  . VIDEO ASSISTED THORACOSCOPY (VATS)/WEDGE RESECTION Right 11/15/2014   Procedure: VIDEO ASSISTED THORACOSCOPY (VATS)/LUNG RESECTION WITH RIGHT LINGULECTOMY;  Surgeon: [name], MD;  Location: [place];  Service: Thoracic;  Laterality: Right;  Marland Kitchen VIDEO BRONCHOSCOPY N/A 11/15/2014   Procedure: VIDEO BRONCHOSCOPY;  Surgeon: [name], MD;  Location: [facility] OR;  Service:  Thoracic;  Laterality: N/A;     SOCIAL HISTORY:  Social History   Socioeconomic History  . Marital status: Married    Spouse name: Not on file  . Number of children: 2  . Years of education: Not on file  . Highest education level: Not on file  Occupational History  . Occupation: self-employed Regulatory affairs officer, currently not working    Fish farm manager: SELF EMPLOYED  Tobacco Use  . Smoking status: Never Smoker  . Smokeless tobacco: Never Used  Substance and Sexual Activity  . Alcohol use: No  . Drug use: No  . Sexual activity: Yes    Birth control/protection: None    Comment: married  Other Topics Concern  . Not on file  Social History Narrative  . Not on file   Social Determinants of Health   Financial Resource Strain: Low Risk   . Difficulty of Paying Living Expenses: Not very hard  Food Insecurity: No Food Insecurity  . Worried About Charity fundraiser in the Last Year: Never true  . Ran Out of Food  in the Last Year: Never true  Transportation Needs: No Transportation Needs  . Lack of Transportation (Medical): No  . Lack of Transportation (Non-Medical): No  Physical Activity: Inactive  . Days of Exercise per Week: 0 days  . Minutes of Exercise per Session: 0 min  Stress: No Stress Concern Present  . Feeling of Stress : Only a little  Social Connections: Unknown  . Frequency of Communication with Friends and Family: More than three times a week  . Frequency of Social Gatherings with Friends and Family: Once a week  . Attends Religious Services: Patient refused  . Active Member of Clubs or Organizations: Patient refused  . Attends Archivist Meetings: Patient refused  . Marital Status: Patient refused  Intimate Partner Violence: Unknown  . Fear of Current or Ex-Partner: Patient refused  . Emotionally Abused: Patient refused  . Physically Abused: Patient refused  . Sexually Abused: Patient refused    FAMILY HISTORY:  Family History  Problem  Relation Age of Onset  . Cancer Brother        throat  . Cancer Brother        prostate  . Colon cancer Neg Hx   . Liver disease Neg Hx   . Inflammatory bowel disease Neg Hx     CURRENT MEDICATIONS:  Outpatient Encounter Medications as of 05/11/2019  Medication Sig Note  . acetaminophen (TYLENOL) 500 MG tablet Take 1,000 mg by mouth every 6 (six) hours as needed for mild pain or moderate pain.    . clonazePAM (KLONOPIN) 1 MG tablet Take 1 tablet (1 mg total) by mouth 3 (three) times daily as needed.   . Diphenhyd-Hydrocort-Nystatin (FIRST-DUKES MOUTHWASH) SUSP Use as directed 5 mLs in the mouth or throat 4 (four) times daily. (Patient taking differently: Use as directed 5 mLs in the mouth or throat 2 (two) times daily. )   . dronabinol (MARINOL) 5 MG capsule Take 1 capsule (5 mg total) by mouth 2 (two) times daily before a meal.   . gabapentin (NEURONTIN) 300 MG capsule Take 3 capsules (900 mg total) by mouth 2 (two) times daily.   Marland Kitchen glimepiride (AMARYL) 2 MG tablet Take 2 mg by mouth 2 (two) times daily before a meal.    . magnesium oxide (MAG-OX) 400 (241.3 Mg) MG tablet Take 1 tablet (400 mg total) by mouth 2 (two) times daily.   . ondansetron (ZOFRAN) 4 MG tablet Take 1 tablet (4 mg total) by mouth daily as needed for nausea or vomiting.   Marland Kitchen oxyCODONE-acetaminophen (PERCOCET) 10-325 MG tablet Take 1 tablet by mouth every 8 (eight) hours as needed for pain.   . pantoprazole (PROTONIX) 40 MG tablet Take 40 mg by mouth every evening.    . Probiotic Product (PROBIOTIC DAILY PO) Take 1 capsule by mouth 2 (two) times daily.    . rivaroxaban (XARELTO) 20 MG TABS tablet Take 1 tablet (20 mg total) by mouth daily with supper.   . vitamin B-12 (CYANOCOBALAMIN) 1000 MCG tablet Take 1,000 mcg by mouth daily. 03/10/2019: Not consistent in taking daily   . [DISCONTINUED] gabapentin (NEURONTIN) 300 MG capsule TAKE 3 CAPSULES BY MOUTH TWICE DAILY (Patient taking differently: Take 900 mg by mouth 2 (two)  times daily. )    Facility-Administered Encounter Medications as of 05/11/2019  Medication  . sodium chloride 0.9 % injection 10 mL  . sodium chloride flush (NS) 0.9 % injection 10 mL    ALLERGIES:  Allergies  Allergen Reactions  .  Oxycodone     Blisters, hallucinations  . Tramadol     Blisters, hallucinations  . Trazodone And Nefazodone Other (See Comments)    hallucinations     PHYSICAL EXAM:  ECOG Performance status: 1  Vitals:   05/11/19 0825  BP: 137/78  Pulse: 92  Resp: 18  Temp: (!) 97.1 F (36.2 C)  SpO2: 100%   Filed Weights   05/11/19 0825  Weight: 164 lb 4.8 oz (74.5 kg)    Physical Exam Constitutional:      Appearance: Normal appearance.  HENT:     Head: Normocephalic.     Nose: Nose normal.     Mouth/Throat:     Mouth: Mucous membranes are moist.     Pharynx: Oropharynx is clear.  Eyes:     Extraocular Movements: Extraocular movements intact.     Conjunctiva/sclera: Conjunctivae normal.  Cardiovascular:     Rate and Rhythm: Regular rhythm. Tachycardia present.     Pulses: Normal pulses.     Heart sounds: Normal heart sounds.  Pulmonary:     Effort: Pulmonary effort is normal.     Breath sounds: Normal breath sounds.  Abdominal:     General: Abdomen is flat. Bowel sounds are normal.     Palpations: Abdomen is soft.  Musculoskeletal:        General: Normal range of motion.     Cervical back: Normal range of motion.  Skin:    General: Skin is warm and dry.  Neurological:     General: No focal deficit present.     Mental Status: He is alert and oriented to person, place, and time.  Psychiatric:        Mood and Affect: Mood normal.        Behavior: Behavior normal.        Thought Content: Thought content normal.        Judgment: Judgment normal.      LABORATORY DATA:  I have reviewed the labs as listed.  CBC    Component Value Date/Time   WBC 6.4 05/11/2019 0826   RBC 4.28 05/11/2019 0826   HGB 11.4 (L) 05/11/2019 0826   HGB  11.6 (L) 02/04/2011 1059   HCT 38.3 (L) 05/11/2019 0826   HCT 34.7 (L) 02/04/2011 1059   PLT 261 05/11/2019 0826   PLT 282 02/04/2011 1059   MCV 89.5 05/11/2019 0826   MCV 76.0 (L) 02/04/2011 1059   MCH 26.6 05/11/2019 0826   MCHC 29.8 (L) 05/11/2019 0826   RDW 15.5 05/11/2019 0826   RDW 18.5 (H) 02/04/2011 1059   LYMPHSABS 1.8 05/11/2019 0826   LYMPHSABS 1.1 02/04/2011 1059   MONOABS 0.4 05/11/2019 0826   MONOABS 0.5 02/04/2011 1059   EOSABS 0.2 05/11/2019 0826   EOSABS 0.1 02/04/2011 1059   BASOSABS 0.0 05/11/2019 0826   BASOSABS 0.1 02/04/2011 1059   CMP Latest Ref Rng & Units 05/11/2019 04/07/2019 03/24/2019  Glucose 70 - 99 mg/dL 189(H) 242(H) 223(H)  BUN 8 - 23 mg/dL '13 9 10  '$ Creatinine 0.61 - 1.24 mg/dL 0.66 0.73 0.71  Sodium 135 - 145 mmol/L 138 140 138  Potassium 3.5 - 5.1 mmol/L 4.0 4.1 5.0  Chloride 98 - 111 mmol/L 103 103 102  CO2 22 - 32 mmol/L '25 26 26  '$ Calcium 8.9 - 10.3 mg/dL 8.6(L) 9.0 9.1  Total Protein 6.5 - 8.1 g/dL 6.1(L) 6.6 6.4(L)  Total Bilirubin 0.3 - 1.2 mg/dL 1.0 0.8 0.5  Alkaline Phos 38 - 126  U/L 141(H) 103 81  AST 15 - 41 U/L '25 17 16  '$ ALT 0 - 44 U/L '17 10 13    '$ I have personally reviewed the scans and discussed with the patient.   ASSESSMENT & PLAN:   Rectal cancer ([place]) 1.  Stage IV rectal cancer with lung and pancreatic metastasis: Foundation 1 CDX shows KRAS G13D and NRAS wild type, MS-stable. -12 cycles of FOLFOX in the adjuvant setting from October 12 through 07/14/2011. -15 cycles of FOLFOX and bevacizumab from 07/08/2016 through 01/20/2017 followed by infusional 5-FU and bevacizumab until 07/14/2017. -Maintenance Xeloda from 08/07/2017 with bevacizumab through 08/19/2018 with progression. - ERCP and stent placement on 09/16/2018 showing single localized biliary stricture.  Biliary sphincterotomy was performed with stricture dilated to 6 mm with balloon to facilitate stent placement.  1 covered metal stent was placed. -Flexible sigmoidoscopy on  09/24/2018 showing narrowing of the colorectal anastomosis.  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 - 2 cycles of FOLFIRI from 10/04/2018 through 10/25/2018 with 20% dose reduction during cycle 2. - He has developed severe tiredness after cycle 2 even after dose reduction. 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  -Cycle 8 of chemotherapy with FOLFIRI and bevacizumab on 02/09/2019. -He was admitted to the hospital from 02/22/2019-02/26/2019 with perforated appendicitis which was managed conservatively. -He was readmitted on 03/10/2019 through 03/14/2019.  He had similar symptoms with right lower quadrant pain.  A pigtail drain was placed on 03/12/2019 by IR.  Drain discontinued on March 22, 2019. -CEA on March 24, 2019 increased to 10. -CT CAP from April 19, 2019 was reviewed which showed slight interval enlargement of a subpleural nodule of the posterior right upper lobe, measuring 1.2 cm, previously 1.0 cm.  Interval enlargement of a subpleural nodule of the dependent right lower lobe measuring 1.1, previously 0.7.  New hypodense inferior right lobe of the liver lesion measuring 8 mm suspicious for mets.  Pancreatic head mass measures 3.1 x 2.3 cm, previously 2.6 x 2.1 cm.  Sclerotic lesion of the posterior right 10th rib, with increasing adjacent soft tissue component. -I have recommended restarting him on chemotherapy.  We will start him back on FOLFIRI to decrease dose of Irinotecan. -If he tolerates chemotherapy well, we may initiate bevacizumab after cycle 2 or 3. -I will see him back in 2 weeks for follow-up.  I have reviewed his labs today.   2.  Weight loss: -He will continue Marinol 5 mg twice daily. -He gained about 2 pounds since last visit.  3.  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  4.  Anxiety: -He will continue Klonopin 1 mg twice  daily.  5.  Right-sided rib pain: -He is taking Percocet 10/325 mg 4 times a day.  His pain is well controlled. -This is from malignancy to the ribs.  Likely will improve after initiation of chemotherapy.  6.  Hypomagnesemia: -Magnesium today is 1.5.  He will receive 2 g of IV magnesium. -We will start him on magnesium 400 mg twice daily.  7.  Peripheral neuropathy: -He is taking gabapentin 300 mg 3 tablets twice daily which is helping.    Orders placed this encounter:  Orders Placed This Encounter  Procedures  . CBC with Differential/Platelet  . Comprehensive metabolic panel  . Magnesium      Derek Jack, MD  [facility]  [phone]   

## 2019-05-12 ENCOUNTER — Other Ambulatory Visit: Payer: Self-pay

## 2019-05-13 ENCOUNTER — Inpatient Hospital Stay (HOSPITAL_COMMUNITY): Payer: Medicare Other

## 2019-05-13 ENCOUNTER — Encounter (HOSPITAL_COMMUNITY): Payer: Medicare Other

## 2019-05-13 ENCOUNTER — Encounter (HOSPITAL_COMMUNITY): Payer: Self-pay

## 2019-05-13 VITALS — BP 111/74 | HR 78 | Temp 97.8°F | Resp 18

## 2019-05-13 DIAGNOSIS — C20 Malignant neoplasm of rectum: Secondary | ICD-10-CM | POA: Diagnosis not present

## 2019-05-13 DIAGNOSIS — Z5111 Encounter for antineoplastic chemotherapy: Secondary | ICD-10-CM | POA: Diagnosis not present

## 2019-05-13 DIAGNOSIS — Z5189 Encounter for other specified aftercare: Secondary | ICD-10-CM | POA: Diagnosis not present

## 2019-05-13 DIAGNOSIS — C7889 Secondary malignant neoplasm of other digestive organs: Secondary | ICD-10-CM | POA: Diagnosis not present

## 2019-05-13 DIAGNOSIS — C7801 Secondary malignant neoplasm of right lung: Secondary | ICD-10-CM | POA: Diagnosis not present

## 2019-05-13 DIAGNOSIS — Z79899 Other long term (current) drug therapy: Secondary | ICD-10-CM | POA: Diagnosis not present

## 2019-05-13 MED ORDER — HEPARIN SOD (PORK) LOCK FLUSH 100 UNIT/ML IV SOLN
500.0000 [IU] | Freq: Once | INTRAVENOUS | Status: AC | PRN
  Administered 2019-05-13: 500 [IU]

## 2019-05-13 MED ORDER — PEGFILGRASTIM-CBQV 6 MG/0.6ML ~~LOC~~ SOSY
6.0000 mg | PREFILLED_SYRINGE | Freq: Once | SUBCUTANEOUS | Status: AC
  Administered 2019-05-13: 6 mg via SUBCUTANEOUS
  Filled 2019-05-13: qty 0.6

## 2019-05-13 MED ORDER — SODIUM CHLORIDE 0.9% FLUSH
10.0000 mL | INTRAVENOUS | Status: DC | PRN
  Administered 2019-05-13: 10 mL

## 2019-05-13 NOTE — Progress Notes (Signed)
Nutrition Follow-up:  Patient with stage IV rectal cancer with lung and pancreatic metastatic disease.  Ileostomy on 09/29/2018.  Patient admitted with perforated appendicitis then readmitted requiring drain placement.    Met with patient at pump removal today.  Patient reports that his appetite is better after hospitalizations.  Reports that he is eating 3 full course meals a day and drinking 4 ensures/glucerna daily (mostly).  Has been taking appetite stimulant.     Medications: reviewed  Labs: reviewed  Anthropometrics:   Weight 164 lb 4.8 oz on 1/6 increased from 161 lb on 12/3 but decreased overall from 173 in 10/7   NUTRITION DIAGNOSIS: Increased nutrient needs continue   INTERVENTION:  Provided case of chocolate glucerna shakes for patient today. Encouraged patient to continue 4 glucerna/ensure daily Encouraged patient to continue 3 meals per day with good sources of protein.   Patient has contact information    MONITORING, EVALUATION, GOAL: Patient will consume adequate calories and protein to maintain weight   NEXT VISIT: Jan 22nd in clinic  [place]. Charles [name], Charles [name], Charles [name] Registered Dietitian [phone] (pager)

## 2019-05-13 NOTE — Progress Notes (Signed)
Patient in for chemo pump disconnect.  Port site clean and dry with no bruising or swelling to site.  Good blood return noted.  No complaints of pain with flush.  Band aid applied.  Patient tolerated injection with no complaints voiced.  Site clean and dry with no bruising or swelling noted.  No complaints of pain.  Discharged with vital signs stable and no signs or symptoms of distress noted.

## 2019-05-16 ENCOUNTER — Encounter (HOSPITAL_COMMUNITY): Payer: Self-pay | Admitting: Hematology

## 2019-05-16 NOTE — Assessment & Plan Note (Signed)
1.  Stage IV rectal cancer with lung and pancreatic metastasis: Foundation 1 CDX shows KRAS G13D and NRAS wild type, MS-stable. -12 cycles of FOLFOX in the adjuvant setting from October 12 through 07/14/2011. -15 cycles of FOLFOX and bevacizumab from 07/08/2016 through 01/20/2017 followed by infusional 5-FU and bevacizumab until 07/14/2017. -Maintenance Xeloda from 08/07/2017 with bevacizumab through 08/19/2018 with progression. - ERCP and stent placement on 09/16/2018 showing single localized biliary stricture.  Biliary sphincterotomy was performed with stricture dilated to 6 mm with balloon to facilitate stent placement.  1 covered metal stent was placed. -Flexible sigmoidoscopy on 09/24/2018 showing narrowing of the colorectal anastomosis.  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 - 2 cycles of FOLFIRI from 10/04/2018 through 10/25/2018 with 20% dose reduction during cycle 2. - He has developed severe tiredness after cycle 2 even after dose reduction. 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  -Cycle 8 of chemotherapy with FOLFIRI and bevacizumab on 02/09/2019. -He was admitted to the hospital from 02/22/2019-02/26/2019 with perforated appendicitis which was managed conservatively. -He was readmitted on 03/10/2019 through 03/14/2019.  He had similar symptoms with right lower quadrant pain.  A pigtail drain was placed on 03/12/2019 by IR.  Drain discontinued on March 22, 2019. -CEA on March 24, 2019 increased to 10. -CT CAP from April 19, 2019 was reviewed which showed slight interval enlargement of a subpleural nodule of the posterior right upper lobe, measuring 1.2 cm, previously 1.0 cm.  Interval enlargement of a subpleural nodule of the dependent right lower lobe measuring 1.1, previously 0.7.  New hypodense inferior right lobe of the liver lesion measuring 8 mm suspicious for mets.  Pancreatic head mass  measures 3.1 x 2.3 cm, previously 2.6 x 2.1 cm.  Sclerotic lesion of the posterior right 10th rib, with increasing adjacent soft tissue component. -I have recommended restarting him on chemotherapy.  We will start him back on FOLFIRI to decrease dose of Irinotecan. -If he tolerates chemotherapy well, we may initiate bevacizumab after cycle 2 or 3. -I will see him back in 2 weeks for follow-up.  I have reviewed his labs today.   2.  Weight loss: -He will continue Marinol 5 mg twice daily. -He gained about 2 pounds since last visit.  3.  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  4.  Anxiety: -He will continue Klonopin 1 mg twice daily.  5.  Right-sided rib pain: -He is taking Percocet 10/325 mg 4 times a day.  His pain is well controlled. -This is from malignancy to the ribs.  Likely will improve after initiation of chemotherapy.  6.  Hypomagnesemia: -Magnesium today is 1.5.  He will receive 2 g of IV magnesium. -We will start him on magnesium 400 mg twice daily.  7.  Peripheral neuropathy: -He is taking gabapentin 300 mg 3 tablets twice daily which is helping.

## 2019-05-17 ENCOUNTER — Other Ambulatory Visit (HOSPITAL_COMMUNITY): Payer: Self-pay | Admitting: *Deleted

## 2019-05-17 DIAGNOSIS — C20 Malignant neoplasm of rectum: Secondary | ICD-10-CM

## 2019-05-17 MED ORDER — OXYCODONE-ACETAMINOPHEN 10-325 MG PO TABS: 1.0000 | ORAL_TABLET | Freq: Three times a day (TID) | ORAL | 0 refills | Status: DC | PRN

## 2019-05-20 ENCOUNTER — Other Ambulatory Visit (HOSPITAL_COMMUNITY): Payer: Self-pay | Admitting: Nurse Practitioner

## 2019-05-20 DIAGNOSIS — C20 Malignant neoplasm of rectum: Secondary | ICD-10-CM

## 2019-05-20 DIAGNOSIS — F419 Anxiety disorder, unspecified: Secondary | ICD-10-CM

## 2019-05-20 MED ORDER — CLONAZEPAM 1 MG PO TABS: 1.0000 mg | ORAL_TABLET | Freq: Three times a day (TID) | ORAL | 0 refills | Status: DC | PRN

## 2019-05-24 DIAGNOSIS — E119 Type 2 diabetes mellitus without complications: Secondary | ICD-10-CM | POA: Diagnosis not present

## 2019-05-24 DIAGNOSIS — I10 Essential (primary) hypertension: Secondary | ICD-10-CM | POA: Diagnosis not present

## 2019-05-25 ENCOUNTER — Inpatient Hospital Stay (HOSPITAL_BASED_OUTPATIENT_CLINIC_OR_DEPARTMENT_OTHER): Payer: Medicare Other | Admitting: Hematology

## 2019-05-25 ENCOUNTER — Other Ambulatory Visit: Payer: Self-pay

## 2019-05-25 ENCOUNTER — Inpatient Hospital Stay (HOSPITAL_COMMUNITY): Payer: Medicare Other

## 2019-05-25 VITALS — BP 113/66 | HR 80 | Temp 97.3°F | Resp 18

## 2019-05-25 DIAGNOSIS — C20 Malignant neoplasm of rectum: Secondary | ICD-10-CM

## 2019-05-25 DIAGNOSIS — C7889 Secondary malignant neoplasm of other digestive organs: Secondary | ICD-10-CM | POA: Diagnosis not present

## 2019-05-25 DIAGNOSIS — Z5111 Encounter for antineoplastic chemotherapy: Secondary | ICD-10-CM | POA: Diagnosis not present

## 2019-05-25 DIAGNOSIS — C7801 Secondary malignant neoplasm of right lung: Secondary | ICD-10-CM | POA: Diagnosis not present

## 2019-05-25 DIAGNOSIS — Z5189 Encounter for other specified aftercare: Secondary | ICD-10-CM | POA: Diagnosis not present

## 2019-05-25 DIAGNOSIS — Z79899 Other long term (current) drug therapy: Secondary | ICD-10-CM | POA: Diagnosis not present

## 2019-05-25 LAB — MAGNESIUM: Magnesium: 1.7 mg/dL (ref 1.7–2.4)

## 2019-05-25 LAB — COMPREHENSIVE METABOLIC PANEL
ALT: 21 U/L (ref 0–44)
AST: 19 U/L (ref 15–41)
Albumin: 2.8 g/dL — ABNORMAL LOW (ref 3.5–5.0)
Alkaline Phosphatase: 149 U/L — ABNORMAL HIGH (ref 38–126)
Anion gap: 11 (ref 5–15)
BUN: 8 mg/dL (ref 8–23)
CO2: 24 mmol/L (ref 22–32)
Calcium: 8.3 mg/dL — ABNORMAL LOW (ref 8.9–10.3)
Chloride: 101 mmol/L (ref 98–111)
Creatinine, Ser: 0.7 mg/dL (ref 0.61–1.24)
GFR calc Af Amer: >60 mL/min (ref >60)
GFR calc non Af Amer: >60 mL/min (ref >60)
Glucose, Bld: 235 mg/dL — ABNORMAL HIGH (ref 70–99)
Potassium: 4.1 mmol/L (ref 3.5–5.1)
Sodium: 136 mmol/L (ref 135–145)
Total Bilirubin: 0.4 mg/dL (ref 0.3–1.2)
Total Protein: 5.6 g/dL — ABNORMAL LOW (ref 6.5–8.1)

## 2019-05-25 LAB — CBC WITH DIFFERENTIAL/PLATELET
Abs Immature Granulocytes: 0.09 10*3/uL — ABNORMAL HIGH (ref 0.00–0.07)
Basophils Absolute: 0 10*3/uL (ref 0.0–0.1)
Basophils Relative: 0 %
Eosinophils Absolute: 0.2 10*3/uL (ref 0.0–0.5)
Eosinophils Relative: 2 %
HCT: 36.3 % — ABNORMAL LOW (ref 39.0–52.0)
Hemoglobin: 11.2 g/dL — ABNORMAL LOW (ref 13.0–17.0)
Immature Granulocytes: 1 %
Lymphocytes Relative: 18 %
Lymphs Abs: 1.6 10*3/uL (ref 0.7–4.0)
MCH: 27.8 pg (ref 26.0–34.0)
MCHC: 30.9 g/dL (ref 30.0–36.0)
MCV: 90.1 fL (ref 80.0–100.0)
Monocytes Absolute: 0.6 10*3/uL (ref 0.1–1.0)
Monocytes Relative: 6 %
Neutro Abs: 6.5 10*3/uL (ref 1.7–7.7)
Neutrophils Relative %: 73 %
Platelets: 208 10*3/uL (ref 150–400)
RBC: 4.03 MIL/uL — ABNORMAL LOW (ref 4.22–5.81)
RDW: 15.8 % — ABNORMAL HIGH (ref 11.5–15.5)
WBC: 8.9 10*3/uL (ref 4.0–10.5)
nRBC: 0 % (ref 0.0–0.2)

## 2019-05-25 MED ORDER — SODIUM CHLORIDE 0.9 % IV SOLN
87.0000 mg/m2 | Freq: Once | INTRAVENOUS | Status: AC
  Administered 2019-05-25: 180 mg via INTRAVENOUS
  Filled 2019-05-25: qty 9

## 2019-05-25 MED ORDER — SODIUM CHLORIDE 0.9 % IV SOLN
1920.0000 mg/m2 | INTRAVENOUS | Status: DC
  Administered 2019-05-25: 13:00:00 4100 mg via INTRAVENOUS
  Filled 2019-05-25: qty 82

## 2019-05-25 MED ORDER — SODIUM CHLORIDE 0.9% FLUSH: 10.0000 mL | INTRAVENOUS | Status: DC | PRN

## 2019-05-25 MED ORDER — SODIUM CHLORIDE 0.9 % IV SOLN: Freq: Once | INTRAVENOUS | Status: AC

## 2019-05-25 MED ORDER — SODIUM CHLORIDE 0.9 % IV SOLN
327.0000 mg/m2 | Freq: Once | INTRAVENOUS | Status: AC
  Administered 2019-05-25: 700 mg via INTRAVENOUS
  Filled 2019-05-25: qty 35

## 2019-05-25 MED ORDER — ATROPINE SULFATE 1 MG/ML IJ SOLN
0.5000 mg | Freq: Once | INTRAMUSCULAR | Status: AC | PRN
  Administered 2019-05-25: 0.5 mg via INTRAVENOUS
  Filled 2019-05-25: qty 1

## 2019-05-25 MED ORDER — SODIUM CHLORIDE 0.9 % IV SOLN
Freq: Once | INTRAVENOUS | Status: AC
  Filled 2019-05-25: qty 5

## 2019-05-25 MED ORDER — FLUOROURACIL CHEMO INJECTION 2.5 GM/50ML
320.0000 mg/m2 | Freq: Once | INTRAVENOUS | Status: AC
  Administered 2019-05-25: 13:00:00 700 mg via INTRAVENOUS
  Filled 2019-05-25: qty 14

## 2019-05-25 MED ORDER — PALONOSETRON HCL INJECTION 0.25 MG/5ML
0.2500 mg | Freq: Once | INTRAVENOUS | Status: AC
  Administered 2019-05-25: 0.25 mg via INTRAVENOUS
  Filled 2019-05-25: qty 5

## 2019-05-25 NOTE — Progress Notes (Signed)
Patient presents today for treatment and follow up visit with Dr. [name]. MAR reviewed. Labs pending. Patient has complaints of abdominal pain today that he associates with his colostomy site/ stoma. Cramping. Patient states he had a fall on Sunday. Denies dizziness or feeling light headed. Patient states he fell in the bathroom and hit the cabinet. Scratches noted to the right arm.   Message received from [facility] LPN/ per Dr. [name]. Proceed with treatment. Labs reviewed. Labs for treatment within parameters per Dr. [name].  Treatment given today per MD orders. Tolerated infusion without adverse affects. Vital signs stable. No complaints at this time. Discharged from clinic ambulatory. F/U with [facility] as scheduled.

## 2019-05-25 NOTE — Patient Instructions (Signed)
[facility] Discharge Instructions for Patients Receiving Chemotherapy  Today you received the following chemotherapy agents   To help prevent nausea and vomiting after your treatment, we encourage you to take your nausea medication   If you develop nausea and vomiting that is not controlled by your nausea medication, call the clinic.   BELOW ARE SYMPTOMS THAT SHOULD BE REPORTED IMMEDIATELY:  *FEVER GREATER THAN 100.5 F  *CHILLS WITH OR WITHOUT FEVER  NAUSEA AND VOMITING THAT IS NOT CONTROLLED WITH YOUR NAUSEA MEDICATION  *UNUSUAL SHORTNESS OF BREATH  *UNUSUAL BRUISING OR BLEEDING  TENDERNESS IN MOUTH AND THROAT WITH OR WITHOUT PRESENCE OF ULCERS  *URINARY PROBLEMS  *BOWEL PROBLEMS  UNUSUAL RASH Items with * indicate a potential emergency and should be followed up as soon as possible.  Feel free to call the clinic should you have any questions or concerns. The clinic phone number is [phone].  Please show the CHEMO ALERT CARD at check-in to the Emergency Department and triage nurse.   

## 2019-05-25 NOTE — Patient Instructions (Signed)
[facility] at [facility] Discharge Instructions  You were seen today by Dr. Katragadda. He went over your recent lab results. He will see you back in 2 weeks for labs, treatment and follow up.   Thank you for choosing [facility] at [facility] to provide your oncology and hematology care.  To afford each patient quality time with our provider, please arrive at least 15 minutes before your scheduled appointment time.   If you have a lab appointment with the Cancer Center please come in thru the  Main Entrance and check in at the main information desk  You need to re-schedule your appointment should you arrive 10 or more minutes late.  We strive to give you quality time with our providers, and arriving late affects you and other patients whose appointments are after yours.  Also, if you no show three or more times for appointments you may be dismissed from the clinic at the providers discretion.     Again, thank you for choosing [facility].  Our hope is that these requests will decrease the amount of time that you wait before being seen by our physicians.       _____________________________________________________________  Should you have questions after your visit to  Cancer Center, please contact our office at [phone] between the hours of 8:00 a.m. and 4:30 p.m.  Voicemails left after 4:00 p.m. will not be returned until the following business day.  For prescription refill requests, have your pharmacy contact our office and allow 72 hours.    Cancer Center Support Programs:   > Cancer Support Group  2nd Tuesday of the month 1pm-2pm, Journey Room    

## 2019-05-25 NOTE — Progress Notes (Signed)
Patient has been assessed, vital signs and labs have been reviewed by Dr. Katragadda. ANC, Creatinine, LFTs, and Platelets are within treatment parameters per Dr. Katragadda. The patient is good to proceed with treatment at this time.  

## 2019-05-25 NOTE — Progress Notes (Signed)
Charles [name], [place] [address]   CLINIC:  Medical Oncology/Hematology  PCP:  [name], MD [place] [address] [phone]   REASON FOR VISIT:  Follow-up for Stage IV Colon Cancer  CURRENT THERAPY: Dose reduced FOLFIRI.  BRIEF ONCOLOGIC HISTORY:  Oncology History  Rectal cancer ([place])  07/24/2010 Initial Diagnosis   Invasive adenocarcinoma of rectum   09/09/2010 Concurrent Chemotherapy   S/P radiation and concurrent 5-FU continuous infusion from 09/09/10- 10/10/10.   11/14/2010 Surgery   S/P proctectomy with colorectal anastomosis and diverting loop ileostomy on 11/14/10 at [facility] by Dr. Harlon Ditty. Pathology reveals a pT3b N1 with 3/20 lymph nodes.   02/05/2011 - 07/14/2011 Chemotherapy   FOLFOX   08/18/2011 Surgery   Approximate date of surgery- [place] by Dr. Harlon Ditty    Remission     11/24/2013 Survivorship   Colonoscopy- somewhat fibrotic/friable anastomotic mucosal-status post biopsy (narrowing not felt to be clinically significant).  Negative pathology for malignancy   09/12/2014 Imaging   DVT in the left femoral venous system, left common iliac vein, IVC, and within the IVC filter Right lower extremity venography confirms chronic occlusion of the femoral venous system with collateralization. The right iliac venous system is patent and do   09/25/2014 PET scan   The right middle lobe pulmonary nodule is hypermetabolic, favored to represent a primary bronchogenic carcinoma.Equivocal mediastinal nodes, similar to surrounding blood pool. Bilateral adrenal hypermetabolism, felt to be physiologic   10/24/2014 Pathology Results   Lung, needle/core biopsy(ies), RML - ADENOCARCINOMA, SEE COMMENT metastatic adenocarcinoma of a colorectal primary   10/24/2014 Relapse/Recurrence     11/16/2014 Definitive Surgery   Bronchoscopy, right video-assisted thoracoscopy, wedge resection of right middle lobe by Dr. Servando Snare    11/16/2014 Pathology Results   Lung, wedge biopsy/resection, right lingula and small portion of middle lobe - METASTATIC ADENOCARCINOMA, CONSISTENT WITH COLORECTAL PRIMARY, SPANNING 2.0 CM. - THE SURGICAL RESECTION MARGINS ARE NEGATIVE FOR ADENOCARCINOMA.   11/16/2014 Remission   THE SURGICAL RESECTION MARGINS ARE NEGATIVE FOR ADENOCARCINOMA.   03/07/2015 Imaging   CT CAP- Interval resection of right middle lobe metastasis. No acute process or evidence of metastatic disease in the chest, abdomen or pelvis. Improved right upper lobe reticular nodular opacities are favored to represent resolving infection.   09/14/2015 Imaging   CT CAP- No findings of recurrent malignancy. No recurrence along the wedge resection site of the right middle lobe. Right anterior abdominal wall focal hernia containing a knuckle of small bowel without complicating feature.   06/13/2016 Imaging   CT CAP- 1. New right-sided pleural metastasis/mass. Other smaller right-sided pulmonary nodules which are all pleural-based and most likely represent pleural metastasis. 2. No convincing evidence of abdominopelvic nodal metastasis. 3. Constellation of findings, including pancreatic atrophy, duct dilatation, and pancreatic head soft tissue fullness which are highly suspicious for pancreatic adenocarcinoma. Metastatic disease felt much less likely. Consider endoscopic ultrasound sampling or ERCP. Cannot exclude superimposed acute pancreatitis. 4. New enlargement of the appendiceal tip with subtle surrounding edema. Cannot exclude early or mild appendicitis. 5. Coronary artery atherosclerosis. Aortic atherosclerosis. 6. 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   06/13/2016 Progression   CT scan demonstrates progression of disease   06/19/2016 Procedure   EUS with FNA by Dr. Ardis Hughs   06/20/2016 Pathology Results   FINE NEEDLE ASPIRATION, ENDOSCOPIC, PANCREAS UNCINATE AREA(SPECIMEN 1 OF 1 COLLECTED  06/19/16): MALIGNANT CELLS CONSISTENT WITH METASTATIC ADENOCARCINOMA.  06/25/2016 PET scan   1. Two pleural-based nodules in the right hemithorax are hypermetabolic. Metastatic disease is a distinct consideration. The scattered pulmonary parenchymal nodule seen on previous diagnostic CT imaging are below the threshold for reliable resolution on PET imaging. 2. Hypermetabolic lesion pancreatic head, consistent with neoplasm. As noted on prior CT, adenocarcinoma is any consideration. No evidence for hypermetabolic abdominal lymphadenopathy. Mottled uptake noted in the liver, but no discrete hepatic metastases are evident on PET imaging. 3. Appendix remains distended up to 0.9-10 mm diameter with a stone towards the tip in subtle periappendiceal edema/inflammation. The appendix is hypermetabolic along its length. Imaging features are relatively stable in the 12 day interval since prior CT scan. While appendicitis is a consideration, the relative stability over 12 days would be unusual for that etiology. Continued close follow-up recommended.   06/26/2016 Pathology Results   Not enough tissue for foundationONE or K-ras testing.   07/07/2016 Procedure   Port placed by Dr. Arnoldo Morale   07/08/2016 -  Chemotherapy   The patient had pegfilgrastim (NEULASTA) injection 6 mg, 6 mg, Subcutaneous,  Once, 0 of 4 cycles  ondansetron (ZOFRAN) IVPB 8 mg, 8 mg, Intravenous,  Once, 0 of 4 cycles  leucovorin 804 mg in dextrose 5 % 250 mL infusion, 400 mg/m2, Intravenous,  Once, 0 of 4 cycles  oxaliplatin (ELOXATIN) 170 mg in dextrose 5 % 500 mL chemo infusion, 85 mg/m2, Intravenous,  Once, 0 of 4 cycles  fluorouracil (ADRUCIL) 4,800 mg in sodium chloride 0.9 % 150 mL chemo infusion, 2,400 mg/m2 = 4,800 mg, Intravenous, 1 Day/Dose, 0 of 4 cycles  fluorouracil (ADRUCIL) chemo injection 800 mg, 400 mg/m2, Intravenous,  Once, 0 of 4 cycles  pegfilgrastim (NEULASTA) injection 6 mg, 6 mg, Subcutaneous,  Once, 2  of 2 cycles  ondansetron (ZOFRAN) IVPB 8 mg, 8 mg, Intravenous,  Once, 12 of 12 cycles  leucovorin 660 mg in dextrose 5 % 250 mL infusion, 660 mg (original dose ), Intravenous,  Once, 12 of 12 cycles Dose modification: 660 mg (Cycle 1)  oxaliplatin (ELOXATIN) 140 mg in dextrose 5 % 500 mL chemo infusion, 140 mg (original dose ), Intravenous,  Once, 12 of 12 cycles Dose modification: 140 mg (Cycle 1), 119 mg (85 % of original dose 140 mg, Cycle 8, Reason: Provider Judgment)  fluorouracil (ADRUCIL) 3,850 mg in sodium chloride 0.9 % 150 mL chemo infusion, 3,850 mg (original dose ), Intravenous, 1 Day/Dose, 12 of 12 cycles Dose modification: 3,850 mg (Cycle 1)  fluorouracil (ADRUCIL) chemo injection 700 mg, 700 mg (original dose ), Intravenous,  Once, 12 of 12 cycles Dose modification: 700 mg (Cycle 1)  palonosetron (ALOXI) injection 0.25 mg, 0.25 mg, Intravenous,  Once, 7 of 12 cycles Administration: 0.25 mg (09/30/2016)  bevacizumab (AVASTIN) 475 mg in sodium chloride 0.9 % 100 mL chemo infusion, 5 mg/kg = 475 mg, Intravenous,  Once, 6 of 11 cycles Administration: 500 mg (09/30/2016)  leucovorin 880 mg in dextrose 5 % 250 mL infusion, 400 mg/m2 = 880 mg, Intravenous,  Once, 7 of 12 cycles Administration: 880 mg (09/30/2016)  oxaliplatin (ELOXATIN) 185 mg in dextrose 5 % 500 mL chemo infusion, 85 mg/m2 = 185 mg, Intravenous,  Once, 7 of 12 cycles Administration: 185 mg (09/30/2016)  fluorouracil (ADRUCIL) chemo injection 900 mg, 400 mg/m2 = 900 mg, Intravenous,  Once, 7 of 12 cycles Administration: 900 mg (09/30/2016)  fluorouracil (ADRUCIL) 5,300 mg in sodium chloride 0.9 % 144 mL chemo infusion, 2,400 mg/m2 = 5,300  mg, Intravenous, 1 Day/Dose, 7 of 12 cycles Administration: 5,300 mg (09/30/2016)  for chemotherapy treatment.     08/27/2016 Imaging   CT CAP- 1. Pulmonary metastatic disease is stable. 2. Pancreatic head mass, grossly stable, with progressive atrophy of the body and tail  of the pancreas. Stable peripancreatic lymph node. 3. Mild circumferential rectal wall thickening, stable. 4. Aortic atherosclerosis (ICD10-170.0). Coronary artery calcification. 5. Hyperattenuating lesion off the lower pole left kidney, stable, too small to characterize. Continued attention on followup exams is warranted. 6. Persistent wall thickening and mild dilatation of the proximal appendix, of uncertain etiology. Continued attention on followup exams is warranted.   11/06/2016 Imaging   CT C/A/P: IMPRESSION: 1. Stable pulmonary metastatic disease. 2. Similar appearing pancreatic head mass. Progressed atrophy of the pancreatic body and tail. Stable peripancreatic lymph node. 3. Stable hypoattenuating lesion partially exophytic off the inferior pole of the left kidney. 4. Aortic atherosclerosis. 5. Persistent mild wall thickening and mild dilatation of the appendix.    01/30/2017 Imaging   Ct C/A/P: IMPRESSION: 1. Stable pulmonary metastatic disease. 2. Stable to slight increase in size of pancreatic head mass. 3.  Aortic Atherosclerosis (ICD10-I70.0). 4. Similar appearance of mild wall thickening of the appendix which contains an appendicolith.   05/01/2017 Imaging   CT C/A/P: Stable disease in the lungs, no progression of disease in the abdomen.   10/04/2018 -  Chemotherapy   The patient had palonosetron (ALOXI) injection 0.25 mg, 0.25 mg, Intravenous,  Once, 10 of 12 cycles Administration: 0.25 mg (10/04/2018), 0.25 mg (10/25/2018), 0.25 mg (12/01/2018), 0.25 mg (12/15/2018), 0.25 mg (12/29/2018), 0.25 mg (01/12/2019), 0.25 mg (01/26/2019), 0.25 mg (02/09/2019), 0.25 mg (05/11/2019), 0.25 mg (05/25/2019) pegfilgrastim (NEULASTA) injection 6 mg, 6 mg, Subcutaneous, Once, 2 of 2 cycles Administration: 6 mg (10/27/2018) pegfilgrastim-cbqv (UDENYCA) injection 6 mg, 6 mg, Subcutaneous, Once, 8 of 10 cycles Administration: 6 mg (12/03/2018), 6 mg (12/17/2018), 6 mg (12/31/2018), 6 mg  (01/14/2019), 6 mg (01/28/2019), 6 mg (02/11/2019), 6 mg (05/13/2019), 6 mg (05/27/2019) bevacizumab (AVASTIN) 450 mg in sodium chloride 0.9 % 100 mL chemo infusion, 5 mg/kg = 450 mg, Intravenous,  Once, 4 of 4 cycles Administration: 400 mg (12/01/2018), 400 mg (12/15/2018), 400 mg (12/29/2018) irinotecan (CAMPTOSAR) 380 mg in sodium chloride 0.9 % 500 mL chemo infusion, 180 mg/m2 = 380 mg, Intravenous,  Once, 10 of 12 cycles Dose modification: 144 mg/m2 (original dose 180 mg/m2, Cycle 2, Reason: Provider Judgment, Comment: 20% dose reduction), 90 mg/m2 (50 % of original dose 180 mg/m2, Cycle 3, Reason: Other (see comments), Comment: [id number]) Administration: 380 mg (10/04/2018), 300 mg (10/25/2018), 200 mg (12/01/2018), 200 mg (12/15/2018), 200 mg (12/29/2018), 200 mg (01/12/2019), 200 mg (01/26/2019), 200 mg (02/09/2019), 180 mg (05/11/2019), 180 mg (05/25/2019) leucovorin 900 mg in sodium chloride 0.9 % 250 mL infusion, 421 mg/m2 = 856 mg, Intravenous,  Once, 10 of 12 cycles Dose modification: 320 mg/m2 (original dose 400 mg/m2, Cycle 2, Reason: Provider Judgment, Comment: 20% dose reduction) Administration: 900 mg (10/04/2018), 700 mg (10/25/2018), 700 mg (12/01/2018), 700 mg (12/15/2018), 700 mg (12/29/2018), 700 mg (01/12/2019), 700 mg (01/26/2019), 700 mg (02/09/2019), 700 mg (05/11/2019), 700 mg (05/25/2019) fluorouracil (ADRUCIL) chemo injection 850 mg, 400 mg/m2 = 850 mg, Intravenous,  Once, 10 of 12 cycles Dose modification: 320 mg/m2 (original dose 400 mg/m2, Cycle 2, Reason: Provider Judgment, Comment: 20% dose reduction) Administration: 850 mg (10/04/2018), 700 mg (10/25/2018), 700 mg (12/01/2018), 700 mg (12/15/2018), 700 mg (12/29/2018), 700 mg (01/12/2019), 700 mg (01/26/2019),  700 mg (02/09/2019), 700 mg (05/11/2019), 700 mg (05/25/2019) fosaprepitant (EMEND) 150 mg, dexamethasone (DECADRON) 12 mg in sodium chloride 0.9 % 145 mL IVPB, , Intravenous,  Once, 8 of 10 cycles Administration:  (12/01/2018),  (12/15/2018),  (12/29/2018),   (01/12/2019),  (01/26/2019),  (02/09/2019),  (05/11/2019),  (05/25/2019) fluorouracil (ADRUCIL) 5,000 mg in sodium chloride 0.9 % 150 mL chemo infusion, 2,325 mg/m2 = 5,150 mg, Intravenous, 1 Day/Dose, 10 of 12 cycles Dose modification: 1,920 mg/m2 (original dose 2,400 mg/m2, Cycle 2, Reason: Provider Judgment, Comment: 20% dose reduction) Administration: 5,000 mg (10/04/2018), 4,100 mg (10/25/2018), 4,100 mg (12/01/2018), 4,100 mg (12/15/2018), 4,100 mg (12/29/2018), 4,100 mg (01/12/2019), 4,100 mg (01/26/2019), 4,100 mg (02/09/2019), 4,100 mg (05/11/2019), 4,100 mg (05/25/2019) bevacizumab-bvzr (ZIRABEV) 400 mg in sodium chloride 0.9 % 100 mL chemo infusion, 5 mg/kg = 400 mg (100 % of original dose 5 mg/kg), Intravenous,  Once, 3 of 5 cycles Dose modification: 5 mg/kg (original dose 5 mg/kg, Cycle 6) Administration: 400 mg (01/12/2019), 400 mg (01/26/2019), 400 mg (02/09/2019)  for chemotherapy treatment.       CANCER STAGING: Cancer Staging Rectal cancer ([place]) Staging form: Colon and Rectum, AJCC 7th Edition - Clinical: Stage IIIB (T3, N1, M0) - Signed by [name], MD on 02/04/2011 - Pathologic stage from 11/17/2014: Stage IVA (M1a) - Signed by Baird Cancer, PA-C on 09/14/2015    INTERVAL HISTORY:  Mr. Ksiazek 69 y.o. male seen for follow-up of metastatic rectal cancer.  Last cycle of chemotherapy was on 05/11/2019.  Denied any fevers, night sweats or weight loss.  No nausea, vomiting or diarrhea.  Reported problems with his ostomy in the back not staying.  He is taking magnesium and gabapentin.  Numbness in the hands and feet has been stable.  Reports dizziness at times.  Leg swellings have been stable.  Posterior rib pain is also stable and controlled with Percocet.  REVIEW OF SYSTEMS:  Review of Systems  Neurological: Positive for numbness.  All other systems reviewed and are negative.    PAST MEDICAL/SURGICAL HISTORY:  Past Medical History:  Diagnosis Date  . Chronic anticoagulation   .  Colon cancer ([place]) 07/24/2010   rectal ca, inv adenocarcinoma  . Depression 04/01/2011  . Diabetes mellitus without complication ([place])   . DVT (deep venous thrombosis) ([place]) 05/09/2011  . GERD (gastroesophageal reflux disease)   . High output ileostomy ([place]) 05/21/2011  . History of kidney stones   . HTN (hypertension)   . Hx of radiation therapy 09/02/10 to 10/14/10   pelvis  . Lung metastasis ([place])   . Neuropathy   . Peripheral vascular disease ([place])    dvt's,pe  . Pneumonia    hx x3  . Pulmonary embolism ([place])   . Rectal cancer ([place]) 01/15/2011   S/P radiation and concurrent 5-FU continuous infusion from 09/09/10- 10/10/10.  S/P proctectomy with colorectal anastomosis and diverting loop ileostomy on 11/14/10 at [facility] by Dr. Harlon Ditty. Pathology reveals a pT3b N1 with 3/20 lymph nodes.     Past Surgical History:  Procedure Laterality Date  . BILIARY STENT PLACEMENT N/A 09/16/2018   Procedure: STENT PLACEMENT;  Surgeon: Rogene Houston, MD;  [facility];  Service: Endoscopy;  Laterality: N/A;  . BIOPSY  09/24/2018   Procedure: BIOPSY;  Surgeon: Danie Binder, MD;  [facility];  Service: Endoscopy;;  . COLON SURGERY  11/14/2010   proctectomy with colorectal anastomosis and diverting loop ileostomy (temporary planned)  . COLONOSCOPY  07/2010   proximal rectal apple core mass 10-14cm from anal verge (adenocarcinoma), 2-3cm distal rectal carpet polyp s/p piecemeal snare polypectomy (adenoma)  . COLONOSCOPY  04/21/2012   RMR: Friable,fibrotic appearing colorectal anastomosis producing some luminal narrowing-not felt to be critical. path: focal erosion with slight inflammation and hyperemia. SURVEILLANCE DUE DEC 2015  . COLONOSCOPY N/A 11/24/2013   Dr. Rourk:somewhat fibrotic/friable anastomotic mucosal-status post biopsy (narrowing not felt to be clinically significant) Single colonic diverticulum. benign polypoid rectal mucosa  . colostomy reversal  april 2013  .  ERCP N/A 09/16/2018   Procedure: ENDOSCOPIC RETROGRADE CHOLANGIOPANCREATOGRAPHY (ERCP);  Surgeon: Rogene Houston, MD;  [facility];  Service: Endoscopy;  Laterality: N/A;  . ESOPHAGOGASTRODUODENOSCOPY  07/2010   RMR: schatki ring s/p dilation, small hh, SB bx benign  . ESOPHAGOGASTRODUODENOSCOPY N/A 09/04/2015   Procedure: ESOPHAGOGASTRODUODENOSCOPY (EGD);  Surgeon: Daneil Dolin, MD;  [facility];  Service: Endoscopy;  Laterality: N/A;  730  . EUS  08/2010   Dr. Owens Loffler. uT3N0 circumferential, nearly obstruction rectosigmoid adenocarcinoma, distal edge 12cm from anal verge  . EUS N/A 06/19/2016   Procedure: UPPER ENDOSCOPIC ULTRASOUND (EUS) RADIAL;  Surgeon: Milus Banister, MD;  Location: [facility];  Service: Endoscopy;  Laterality: N/A;  . FLEXIBLE SIGMOIDOSCOPY N/A 09/24/2018   Procedure: FLEXIBLE SIGMOIDOSCOPY;  Surgeon: Danie Binder, MD;  [facility];  Service: Endoscopy;  Laterality: N/A;  . HERNIA REPAIR     abd hernia repair  . IVC filter    . ivc filter    . port a cath placement    . PORT-A-CATH REMOVAL  09/24/2011   Procedure: REMOVAL PORT-A-CATH;  Surgeon: Donato Heinz, MD;  Location: [facility];  Service: General;  Laterality: N/A;  Minor Room  . PORTACATH PLACEMENT Right 07/07/2016   Procedure: INSERTION PORT-A-CATH;  Surgeon: Aviva Signs, MD;  Location: [facility];  Service: General;  Laterality: Right;  . SPHINCTEROTOMY N/A 09/16/2018   Procedure: SPHINCTEROTOMY with balloon dialation;  Surgeon: Rogene Houston, MD;  [facility];  Service: Endoscopy;  Laterality: N/A;  . TRANSVERSE LOOP COLOSTOMY N/A 09/29/2018   Procedure: TRANSVERSE LOOP COLOSTOMY;  Surgeon: Aviva Signs, MD;  Location: [facility];  Service: General;  Laterality: N/A;  . VIDEO ASSISTED THORACOSCOPY (VATS)/WEDGE RESECTION Right 11/15/2014   Procedure: VIDEO ASSISTED THORACOSCOPY (VATS)/LUNG RESECTION WITH RIGHT LINGULECTOMY;  Surgeon: [name], MD;   Location: [facility];  Service: Thoracic;  Laterality: Right;  Marland Kitchen VIDEO BRONCHOSCOPY N/A 11/15/2014   Procedure: VIDEO BRONCHOSCOPY;  Surgeon: [name], MD;  Location: [facility] OR;  Service: Thoracic;  Laterality: N/A;     SOCIAL HISTORY:  Social History   Socioeconomic History  . Marital status: Married    Spouse name: Not on file  . Number of children: 2  . Years of education: Not on file  . Highest education level: Not on file  Occupational History  . Occupation: self-employed Regulatory affairs officer, currently not working    Fish farm manager: SELF EMPLOYED  Tobacco Use  . Smoking status: Never Smoker  . Smokeless tobacco: Never Used  Substance and Sexual Activity  . Alcohol use: No  . Drug use: No  . Sexual activity: Yes    Birth control/protection: None    Comment: married  Other Topics Concern  . Not on file  Social History Narrative  . Not on file   Social Determinants of Health   Financial Resource Strain: Low Risk   . Difficulty  of Paying Living Expenses: Not very hard  Food Insecurity: No Food Insecurity  . Worried About Charity fundraiser in the Last Year: Never true  . Ran Out of Food in the Last Year: Never true  Transportation Needs: No Transportation Needs  . Lack of Transportation (Medical): No  . Lack of Transportation (Non-Medical): No  Physical Activity: Inactive  . Days of Exercise per Week: 0 days  . Minutes of Exercise per Session: 0 min  Stress: No Stress Concern Present  . Feeling of Stress : Only a little  Social Connections: Unknown  . Frequency of Communication with Friends and Family: More than three times a week  . Frequency of Social Gatherings with Friends and Family: Once a week  . Attends Religious Services: Patient refused  . Active Member of Clubs or Organizations: Patient refused  . Attends Archivist Meetings: Patient refused  . Marital Status: Patient refused  Intimate Partner Violence: Unknown  . Fear of Current or  Ex-Partner: Patient refused  . Emotionally Abused: Patient refused  . Physically Abused: Patient refused  . Sexually Abused: Patient refused    FAMILY HISTORY:  Family History  Problem Relation Age of Onset  . Cancer Brother        throat  . Cancer Brother        prostate  . Colon cancer Neg Hx   . Liver disease Neg Hx   . Inflammatory bowel disease Neg Hx     CURRENT MEDICATIONS:  Outpatient Encounter Medications as of 05/25/2019  Medication Sig  . Diphenhyd-Hydrocort-Nystatin (FIRST-DUKES MOUTHWASH) SUSP Use as directed 5 mLs in the mouth or throat 4 (four) times daily. (Patient taking differently: Use as directed 5 mLs in the mouth or throat 2 (two) times daily. )  . dronabinol (MARINOL) 5 MG capsule Take 1 capsule (5 mg total) by mouth 2 (two) times daily before a meal.  . gabapentin (NEURONTIN) 300 MG capsule Take 3 capsules (900 mg total) by mouth 2 (two) times daily.  Marland Kitchen glimepiride (AMARYL) 2 MG tablet Take 2 mg by mouth 2 (two) times daily before a meal.   . magnesium oxide (MAG-OX) 400 (241.3 Mg) MG tablet Take 1 tablet (400 mg total) by mouth 2 (two) times daily.  . mirtazapine (REMERON) 15 MG tablet Take 7.5 mg by mouth at bedtime.  . pantoprazole (PROTONIX) 40 MG tablet Take 40 mg by mouth every evening.   . Probiotic Product (PROBIOTIC DAILY PO) Take 1 capsule by mouth 2 (two) times daily.   . rivaroxaban (XARELTO) 20 MG TABS tablet Take 1 tablet (20 mg total) by mouth daily with supper.  . vitamin B-12 (CYANOCOBALAMIN) 1000 MCG tablet Take 1,000 mcg by mouth daily.  Marland Kitchen acetaminophen (TYLENOL) 500 MG tablet Take 1,000 mg by mouth every 6 (six) hours as needed for mild pain or moderate pain.   . clonazePAM (KLONOPIN) 1 MG tablet Take 1 tablet (1 mg total) by mouth 3 (three) times daily as needed. (Patient not taking: Reported on 05/25/2019)  . ondansetron (ZOFRAN) 4 MG tablet Take 1 tablet (4 mg total) by mouth daily as needed for nausea or vomiting. (Patient not taking:  Reported on 05/25/2019)  . oxyCODONE-acetaminophen (PERCOCET) 10-325 MG tablet Take 1 tablet by mouth every 8 (eight) hours as needed for pain. (Patient not taking: Reported on 05/25/2019)   Facility-Administered Encounter Medications as of 05/25/2019  Medication  . sodium chloride 0.9 % injection 10 mL  . sodium chloride flush (NS)  0.9 % injection 10 mL    ALLERGIES:  Allergies  Allergen Reactions  . Oxycodone     Blisters, hallucinations  . Tramadol     Blisters, hallucinations  . Trazodone And Nefazodone Other (See Comments)    hallucinations     PHYSICAL EXAM:  ECOG Performance status: 1  Vitals:   05/25/19 0830  BP: 117/72  Pulse: 79  Resp: 18  Temp: (!) 96.9 F (36.1 C)  SpO2: 100%   Filed Weights   05/25/19 0830  Weight: 165 lb 9.6 oz (75.1 kg)    Physical Exam Constitutional:      Appearance: Normal appearance.  HENT:     Head: Normocephalic.     Nose: Nose normal.     Mouth/Throat:     Mouth: Mucous membranes are moist.     Pharynx: Oropharynx is clear.  Eyes:     Extraocular Movements: Extraocular movements intact.     Conjunctiva/sclera: Conjunctivae normal.  Cardiovascular:     Rate and Rhythm: Regular rhythm. Tachycardia present.     Pulses: Normal pulses.     Heart sounds: Normal heart sounds.  Pulmonary:     Effort: Pulmonary effort is normal.     Breath sounds: Normal breath sounds.  Abdominal:     General: Abdomen is flat. Bowel sounds are normal.     Palpations: Abdomen is soft.  Musculoskeletal:        General: Normal range of motion.     Cervical back: Normal range of motion.  Skin:    General: Skin is warm and dry.  Neurological:     General: No focal deficit present.     Mental Status: He is alert and oriented to person, place, and time.  Psychiatric:        Mood and Affect: Mood normal.        Behavior: Behavior normal.        Thought Content: Thought content normal.        Judgment: Judgment normal.      LABORATORY  DATA:  I have reviewed the labs as listed.  CBC    Component Value Date/Time   WBC 8.9 05/25/2019 0814   RBC 4.03 (L) 05/25/2019 0814   HGB 11.2 (L) 05/25/2019 0814   HGB 11.6 (L) 02/04/2011 1059   HCT 36.3 (L) 05/25/2019 0814   HCT 34.7 (L) 02/04/2011 1059   PLT 208 05/25/2019 0814   PLT 282 02/04/2011 1059   MCV 90.1 05/25/2019 0814   MCV 76.0 (L) 02/04/2011 1059   MCH 27.8 05/25/2019 0814   MCHC 30.9 05/25/2019 0814   RDW 15.8 (H) 05/25/2019 0814   RDW 18.5 (H) 02/04/2011 1059   LYMPHSABS 1.6 05/25/2019 0814   LYMPHSABS 1.1 02/04/2011 1059   MONOABS 0.6 05/25/2019 0814   MONOABS 0.5 02/04/2011 1059   EOSABS 0.2 05/25/2019 0814   EOSABS 0.1 02/04/2011 1059   BASOSABS 0.0 05/25/2019 0814   BASOSABS 0.1 02/04/2011 1059   CMP Latest Ref Rng & Units 05/25/2019 05/11/2019 04/07/2019  Glucose 70 - 99 mg/dL 235(H) 189(H) 242(H)  BUN 8 - 23 mg/dL '8 13 9  '$ Creatinine 0.61 - 1.24 mg/dL 0.70 0.66 0.73  Sodium 135 - 145 mmol/L 136 138 140  Potassium 3.5 - 5.1 mmol/L 4.1 4.0 4.1  Chloride 98 - 111 mmol/L 101 103 103  CO2 22 - 32 mmol/L '24 25 26  '$ Calcium 8.9 - 10.3 mg/dL 8.3(L) 8.6(L) 9.0  Total Protein 6.5 - 8.1 g/dL 5.6(L) 6.1(L)  6.6  Total Bilirubin 0.3 - 1.2 mg/dL 0.4 1.0 0.8  Alkaline Phos 38 - 126 U/L 149(H) 141(H) 103  AST 15 - 41 U/L '19 25 17  '$ ALT 0 - 44 U/L '21 17 10    '$ I have personally reviewed the scans and discussed with the patient.   ASSESSMENT & PLAN:   Rectal cancer ([place]) 1.  Stage IV rectal cancer with lung and pancreatic metastasis: Foundation 1 CDX shows KRAS G13D and NRAS wild type, MS-stable. -12 cycles of FOLFOX in the adjuvant setting from October 12 through 07/14/2011. -15 cycles of FOLFOX and bevacizumab from 07/08/2016 through 01/20/2017 followed by infusional 5-FU and bevacizumab until 07/14/2017. -Maintenance Xeloda from 08/07/2017 with bevacizumab through 08/19/2018 with progression. - ERCP and stent placement on 09/16/2018 showing single localized biliary  stricture.  Biliary sphincterotomy was performed with stricture dilated to 6 mm with balloon to facilitate stent placement.  1 covered metal stent was placed. -Flexible sigmoidoscopy on 09/24/2018 showing narrowing of the colorectal anastomosis.  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 - 2 cycles of FOLFIRI from 10/04/2018 through 10/25/2018 with 20% dose reduction during cycle 2. - He has developed severe tiredness after cycle 2 even after dose reduction. 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  -Cycle 8 of chemotherapy with FOLFIRI and bevacizumab on 02/09/2019. -He was admitted to the hospital from 02/22/2019-02/26/2019 with perforated appendicitis which was managed conservatively. -He was readmitted on 03/10/2019 through 03/14/2019.  He had similar symptoms with right lower quadrant pain.  A pigtail drain was placed on 03/12/2019 by IR.  Drain discontinued on March 22, 2019. -CEA on March 24, 2019 increased to 10. -CT CAP from April 19, 2019 was reviewed which showed slight interval enlargement of a subpleural nodule of the posterior right upper lobe, measuring 1.2 cm, previously 1.0 cm.  Interval enlargement of a subpleural nodule of the dependent right lower lobe measuring 1.1, previously 0.7.  New hypodense inferior right lobe of the liver lesion measuring 8 mm suspicious for mets.  Pancreatic head mass measures 3.1 x 2.3 cm, previously 2.6 x 2.1 cm.  Sclerotic lesion of the posterior right 10th rib, with increasing adjacent soft tissue component. -Cycle 1 of dose reduced FOLFIRI on 05/11/2019. -Denied any nausea vomiting or diarrhea.  Reported that when he lies down, his ostomy goes into his abdomen.  As result his bag comes loose.  He is having trouble managing it. -He has transverse loop ileostomy previously by Dr. Arnoldo Morale.  I have told him to reach out to Dr. Arnoldo Morale regarding this. -I have  reviewed his labs.  He will proceed with cycle 2 with the same dose.  I would not add bevacizumab at this time. -Back in 2 weeks for follow-up.   2.  Weight loss: -He gained 1 pound since last visit. -She will continue Marinol 5 mg twice daily.  3.  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  He will continue Xarelto.  No bleeding issues.  4.  Anxiety: -He will continue Klonopin 1 mg twice daily.  5.  Right-sided posterior rib pain: -This is from malignancy to the ribs.  He will continue Percocet 10/325 up to 4 times a day.  6.  Hypomagnesemia: -Magnesium is 1.7 today.  He will continue magnesium twice daily at home.  7.-Neuropathy: -He will continue gabapentin 300 mg 3 times a day.    Orders placed this  encounter:  No orders of the defined types were placed in this encounter.  Total time spent is 30 minutes with more than 50% of time spent face-to-face.   Derek Jack, MD  [place] [phone]

## 2019-05-27 ENCOUNTER — Other Ambulatory Visit: Payer: Self-pay

## 2019-05-27 ENCOUNTER — Encounter (HOSPITAL_COMMUNITY): Payer: Self-pay

## 2019-05-27 ENCOUNTER — Inpatient Hospital Stay (HOSPITAL_COMMUNITY): Payer: Medicare Other

## 2019-05-27 VITALS — BP 107/61 | HR 69 | Temp 97.5°F | Resp 18

## 2019-05-27 DIAGNOSIS — C7889 Secondary malignant neoplasm of other digestive organs: Secondary | ICD-10-CM | POA: Diagnosis not present

## 2019-05-27 DIAGNOSIS — C7801 Secondary malignant neoplasm of right lung: Secondary | ICD-10-CM | POA: Diagnosis not present

## 2019-05-27 DIAGNOSIS — C20 Malignant neoplasm of rectum: Secondary | ICD-10-CM | POA: Diagnosis not present

## 2019-05-27 DIAGNOSIS — Z5111 Encounter for antineoplastic chemotherapy: Secondary | ICD-10-CM | POA: Diagnosis not present

## 2019-05-27 DIAGNOSIS — Z79899 Other long term (current) drug therapy: Secondary | ICD-10-CM | POA: Diagnosis not present

## 2019-05-27 DIAGNOSIS — Z5189 Encounter for other specified aftercare: Secondary | ICD-10-CM | POA: Diagnosis not present

## 2019-05-27 MED ORDER — SODIUM CHLORIDE 0.9% FLUSH
10.0000 mL | INTRAVENOUS | Status: DC | PRN
  Administered 2019-05-27: 10 mL

## 2019-05-27 MED ORDER — PEGFILGRASTIM-CBQV 6 MG/0.6ML ~~LOC~~ SOSY
6.0000 mg | PREFILLED_SYRINGE | Freq: Once | SUBCUTANEOUS | Status: AC
  Administered 2019-05-27: 13:00:00 6 mg via SUBCUTANEOUS

## 2019-05-27 MED ORDER — HEPARIN SOD (PORK) LOCK FLUSH 100 UNIT/ML IV SOLN
500.0000 [IU] | Freq: Once | INTRAVENOUS | Status: AC | PRN
  Administered 2019-05-27: 500 [IU]

## 2019-05-27 MED ORDER — PEGFILGRASTIM-CBQV 6 MG/0.6ML ~~LOC~~ SOSY
PREFILLED_SYRINGE | SUBCUTANEOUS | Status: AC
  Filled 2019-05-27: qty 0.6

## 2019-05-27 NOTE — Progress Notes (Signed)
Nutrition Follow-up:  Patient with stage IV rectal cancer with lung and pancreatic metastatic disease.  Ileostomy on 09/29/2018.    Met with patient prior to pump removal.  Patient reports that appetite continues to be good.  Ate eggs, bacon, gravy, grits, biscuit and milk this am for breakfast. Lunch is usually sandwich and supper last night was 4 tacos and bowl of beans.  Usually goes out to eat with wife for most meals.  Drinking 3-4 glucerna/ensure per day.     Medications: reviewed  Labs: glucose 235  Anthropometrics:   Weight 165 lb 9.6 oz on 1/20 increased from 164 lb on 1/6 and increased from 161 lb on 12/3.     NUTRITION DIAGNOSIS: Increased nutrient needs continue   INTERVENTION:  Patient to continue to eat 3 meals per day Continue 3-4 glucerna/ensure per day.     MONITORING, EVALUATION, GOAL: Patient will consume adequate calories and protein to maintain weight   NEXT VISIT: as needed  Brenin Heidelberger B. Zenia Resides, [place], [place] Registered Dietitian [phone] (pager)

## 2019-05-27 NOTE — Patient Instructions (Signed)
[place] at [facility] Discharge Instructions  5FU pump discontinued with portacath flushed as well as Udenyca injection given today. Follow-up as scheduled. Call clinic for any questions or concerns    Thank you for choosing [place] [facility] to provide your oncology and hematology care.  To afford each patient quality time with our provider, please arrive at least 15 minutes before your scheduled appointment time.   If you have a lab appointment with the [facility] please come in thru the Main Entrance and check in at the main information desk.  You need to re-schedule your appointment should you arrive 10 or more minutes late.  We strive to give you quality time with our providers, and arriving late affects you and other patients whose appointments are after yours.  Also, if you no show three or more times for appointments you may be dismissed from the clinic at the providers discretion.     Again, thank you for choosing [facility].  Our hope is that these requests will decrease the amount of time that you wait before being seen by our physicians.       _____________________________________________________________  Should you have questions after your visit to [facility], please contact our office at [phone] between the hours of 8:00 a.m. and 4:30 p.m.  Voicemails left after 4:00 p.m. will not be returned until the following business day.  For prescription refill requests, have your pharmacy contact our office and allow 72 hours.    Due to Covid, you will need to wear a mask upon entering the hospital. If you do not have a mask, a mask will be given to you at the Main Entrance upon arrival. For doctor visits, patients may have 1 support person with them. For treatment visits, patients can not have anyone with them due to social distancing guidelines and our immunocompromised population.

## 2019-05-27 NOTE — Progress Notes (Signed)
Charles [name] tolerated 5FU pump well without complaints or incident.5FU pump discontinued with portacath flushed easily per protocol. Pt also tolerated Udenyca well without issues. VSS Pt discharged self ambulatory in satisfactory ocndition

## 2019-05-28 ENCOUNTER — Encounter (HOSPITAL_COMMUNITY): Payer: Self-pay | Admitting: Hematology

## 2019-05-28 NOTE — Assessment & Plan Note (Signed)
1.  Stage IV rectal cancer with lung and pancreatic metastasis: Foundation 1 CDX shows KRAS G13D and NRAS wild type, MS-stable. -12 cycles of FOLFOX in the adjuvant setting from October 12 through 07/14/2011. -15 cycles of FOLFOX and bevacizumab from 07/08/2016 through 01/20/2017 followed by infusional 5-FU and bevacizumab until 07/14/2017. -Maintenance Xeloda from 08/07/2017 with bevacizumab through 08/19/2018 with progression. - ERCP and stent placement on 09/16/2018 showing single localized biliary stricture.  Biliary sphincterotomy was performed with stricture dilated to 6 mm with balloon to facilitate stent placement.  1 covered metal stent was placed. -Flexible sigmoidoscopy on 09/24/2018 showing narrowing of the colorectal anastomosis.  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 - 2 cycles of FOLFIRI from 10/04/2018 through 10/25/2018 with 20% dose reduction during cycle 2. - He has developed severe tiredness after cycle 2 even after dose reduction. 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  -Cycle 8 of chemotherapy with FOLFIRI and bevacizumab on 02/09/2019. -He was admitted to the hospital from 02/22/2019-02/26/2019 with perforated appendicitis which was managed conservatively. -He was readmitted on 03/10/2019 through 03/14/2019.  He had similar symptoms with right lower quadrant pain.  A pigtail drain was placed on 03/12/2019 by IR.  Drain discontinued on March 22, 2019. -CEA on March 24, 2019 increased to 10. -CT CAP from April 19, 2019 was reviewed which showed slight interval enlargement of a subpleural nodule of the posterior right upper lobe, measuring 1.2 cm, previously 1.0 cm.  Interval enlargement of a subpleural nodule of the dependent right lower lobe measuring 1.1, previously 0.7.  New hypodense inferior right lobe of the liver lesion measuring 8 mm suspicious for mets.  Pancreatic head mass  measures 3.1 x 2.3 cm, previously 2.6 x 2.1 cm.  Sclerotic lesion of the posterior right 10th rib, with increasing adjacent soft tissue component. -Cycle 1 of dose reduced FOLFIRI on 05/11/2019. -Denied any nausea vomiting or diarrhea.  Reported that when he lies down, his ostomy goes into his abdomen.  As result his bag comes loose.  He is having trouble managing it. -He has transverse loop ileostomy previously by Dr. Arnoldo Morale.  I have told him to reach out to Dr. Arnoldo Morale regarding this. -I have reviewed his labs.  He will proceed with cycle 2 with the same dose.  I would not add bevacizumab at this time. -Back in 2 weeks for follow-up.   2.  Weight loss: -He gained 1 pound since last visit. -She will continue Marinol 5 mg twice daily.  3.  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  He will continue Xarelto.  No bleeding issues.  4.  Anxiety: -He will continue Klonopin 1 mg twice daily.  5.  Right-sided posterior rib pain: -This is from malignancy to the ribs.  He will continue Percocet 10/325 up to 4 times a day.  6.  Hypomagnesemia: -Magnesium is 1.7 today.  He will continue magnesium twice daily at home.  7.-Neuropathy: -He will continue gabapentin 300 mg 3 times a day.

## 2019-06-08 ENCOUNTER — Inpatient Hospital Stay (HOSPITAL_BASED_OUTPATIENT_CLINIC_OR_DEPARTMENT_OTHER): Payer: Medicare Other | Admitting: Hematology

## 2019-06-08 ENCOUNTER — Other Ambulatory Visit: Payer: Self-pay

## 2019-06-08 ENCOUNTER — Inpatient Hospital Stay (HOSPITAL_COMMUNITY): Payer: Medicare Other | Attending: Hematology

## 2019-06-08 ENCOUNTER — Encounter (HOSPITAL_COMMUNITY): Payer: Self-pay | Admitting: Hematology

## 2019-06-08 ENCOUNTER — Inpatient Hospital Stay (HOSPITAL_COMMUNITY): Payer: Medicare Other

## 2019-06-08 VITALS — BP 113/58 | HR 72 | Temp 97.7°F | Resp 18

## 2019-06-08 DIAGNOSIS — Z5189 Encounter for other specified aftercare: Secondary | ICD-10-CM | POA: Insufficient documentation

## 2019-06-08 DIAGNOSIS — R634 Abnormal weight loss: Secondary | ICD-10-CM | POA: Diagnosis not present

## 2019-06-08 DIAGNOSIS — Z79899 Other long term (current) drug therapy: Secondary | ICD-10-CM | POA: Diagnosis not present

## 2019-06-08 DIAGNOSIS — C189 Malignant neoplasm of colon, unspecified: Secondary | ICD-10-CM | POA: Diagnosis not present

## 2019-06-08 DIAGNOSIS — F419 Anxiety disorder, unspecified: Secondary | ICD-10-CM | POA: Diagnosis not present

## 2019-06-08 DIAGNOSIS — C7800 Secondary malignant neoplasm of unspecified lung: Secondary | ICD-10-CM | POA: Diagnosis not present

## 2019-06-08 DIAGNOSIS — C20 Malignant neoplasm of rectum: Secondary | ICD-10-CM

## 2019-06-08 DIAGNOSIS — C7889 Secondary malignant neoplasm of other digestive organs: Secondary | ICD-10-CM | POA: Insufficient documentation

## 2019-06-08 DIAGNOSIS — Z5111 Encounter for antineoplastic chemotherapy: Secondary | ICD-10-CM | POA: Insufficient documentation

## 2019-06-08 LAB — CBC WITH DIFFERENTIAL/PLATELET
Abs Immature Granulocytes: 0.18 10*3/uL — ABNORMAL HIGH (ref 0.00–0.07)
Basophils Absolute: 0.1 10*3/uL (ref 0.0–0.1)
Basophils Relative: 1 %
Eosinophils Absolute: 0.1 10*3/uL (ref 0.0–0.5)
Eosinophils Relative: 1 %
HCT: 36.2 % — ABNORMAL LOW (ref 39.0–52.0)
Hemoglobin: 11 g/dL — ABNORMAL LOW (ref 13.0–17.0)
Immature Granulocytes: 2 %
Lymphocytes Relative: 18 %
Lymphs Abs: 2.1 10*3/uL (ref 0.7–4.0)
MCH: 27.7 pg (ref 26.0–34.0)
MCHC: 30.4 g/dL (ref 30.0–36.0)
MCV: 91.2 fL (ref 80.0–100.0)
Monocytes Absolute: 0.7 10*3/uL (ref 0.1–1.0)
Monocytes Relative: 6 %
Neutro Abs: 8.3 10*3/uL — ABNORMAL HIGH (ref 1.7–7.7)
Neutrophils Relative %: 72 %
Platelets: 223 10*3/uL (ref 150–400)
RBC: 3.97 MIL/uL — ABNORMAL LOW (ref 4.22–5.81)
RDW: 16.5 % — ABNORMAL HIGH (ref 11.5–15.5)
WBC: 11.4 10*3/uL — ABNORMAL HIGH (ref 4.0–10.5)
nRBC: 0 % (ref 0.0–0.2)

## 2019-06-08 LAB — COMPREHENSIVE METABOLIC PANEL
ALT: 44 U/L (ref 0–44)
AST: 22 U/L (ref 15–41)
Albumin: 2.8 g/dL — ABNORMAL LOW (ref 3.5–5.0)
Alkaline Phosphatase: 200 U/L — ABNORMAL HIGH (ref 38–126)
Anion gap: 7 (ref 5–15)
BUN: 9 mg/dL (ref 8–23)
CO2: 25 mmol/L (ref 22–32)
Calcium: 8.2 mg/dL — ABNORMAL LOW (ref 8.9–10.3)
Chloride: 105 mmol/L (ref 98–111)
Creatinine, Ser: 0.73 mg/dL (ref 0.61–1.24)
GFR calc Af Amer: >60 mL/min (ref >60)
GFR calc non Af Amer: >60 mL/min (ref >60)
Glucose, Bld: 275 mg/dL — ABNORMAL HIGH (ref 70–99)
Potassium: 4.1 mmol/L (ref 3.5–5.1)
Sodium: 137 mmol/L (ref 135–145)
Total Bilirubin: 0.5 mg/dL (ref 0.3–1.2)
Total Protein: 5.1 g/dL — ABNORMAL LOW (ref 6.5–8.1)

## 2019-06-08 MED ORDER — SODIUM CHLORIDE 0.9 % IV SOLN
Freq: Once | INTRAVENOUS | Status: AC
  Filled 2019-06-08: qty 5

## 2019-06-08 MED ORDER — FLUOROURACIL CHEMO INJECTION 2.5 GM/50ML
320.0000 mg/m2 | Freq: Once | INTRAVENOUS | Status: AC
  Administered 2019-06-08: 13:00:00 700 mg via INTRAVENOUS
  Filled 2019-06-08: qty 14

## 2019-06-08 MED ORDER — ATROPINE SULFATE 1 MG/ML IJ SOLN
0.5000 mg | Freq: Once | INTRAMUSCULAR | Status: AC | PRN
  Administered 2019-06-08: 0.5 mg via INTRAVENOUS

## 2019-06-08 MED ORDER — ATROPINE SULFATE 1 MG/ML IJ SOLN
INTRAMUSCULAR | Status: AC
  Filled 2019-06-08: qty 1

## 2019-06-08 MED ORDER — SODIUM CHLORIDE 0.9 % IV SOLN: Freq: Once | INTRAVENOUS | Status: AC

## 2019-06-08 MED ORDER — SODIUM CHLORIDE 0.9 % IV SOLN
327.0000 mg/m2 | Freq: Once | INTRAVENOUS | Status: AC
  Administered 2019-06-08: 700 mg via INTRAVENOUS
  Filled 2019-06-08: qty 35

## 2019-06-08 MED ORDER — SODIUM CHLORIDE 0.9% FLUSH
10.0000 mL | INTRAVENOUS | Status: DC | PRN
  Administered 2019-06-08: 10 mL

## 2019-06-08 MED ORDER — SODIUM CHLORIDE 0.9 % IV SOLN
90.0000 mg/m2 | Freq: Once | INTRAVENOUS | Status: AC
  Administered 2019-06-08: 180 mg via INTRAVENOUS
  Filled 2019-06-08: qty 9

## 2019-06-08 MED ORDER — OCTREOTIDE ACETATE 30 MG IM KIT
PACK | INTRAMUSCULAR | Status: AC
  Filled 2019-06-08: qty 1

## 2019-06-08 MED ORDER — PALONOSETRON HCL INJECTION 0.25 MG/5ML
0.2500 mg | Freq: Once | INTRAVENOUS | Status: AC
  Administered 2019-06-08: 10:00:00 0.25 mg via INTRAVENOUS

## 2019-06-08 MED ORDER — SODIUM CHLORIDE 0.9 % IV SOLN
1920.0000 mg/m2 | INTRAVENOUS | Status: DC
  Administered 2019-06-08: 4100 mg via INTRAVENOUS
  Filled 2019-06-08: qty 82

## 2019-06-08 NOTE — Progress Notes (Signed)
Patient has been assessed, vital signs and labs have been reviewed by Dr. [name]. ANC, Creatinine, LFTs, and Platelets are within treatment parameters per Dr. [name]. Per Dr. [name] FOLFIRI with no Avastin today.The patient is good to proceed with treatment at this time.

## 2019-06-08 NOTE — Progress Notes (Signed)
[place], [place] [address]   CLINIC:  Medical Oncology/Hematology  PCP:  [name], MD [place] [address] [phone]   REASON FOR VISIT:  Follow-up for Stage IV Colon Cancer  CURRENT THERAPY: Dose reduced FOLFIRI.  BRIEF ONCOLOGIC HISTORY:  Oncology History  Rectal cancer ([place])  07/24/2010 Initial Diagnosis   Invasive adenocarcinoma of rectum   09/09/2010 Concurrent Chemotherapy   S/P radiation and concurrent 5-FU continuous infusion from 09/09/10- 10/10/10.   11/14/2010 Surgery   S/P proctectomy with colorectal anastomosis and diverting loop ileostomy on 11/14/10 at [facility] by Dr. Harlon Ditty. Pathology reveals a pT3b N1 with 3/20 lymph nodes.   02/05/2011 - 07/14/2011 Chemotherapy   FOLFOX   08/18/2011 Surgery   Approximate date of surgery- [place] by Dr. Harlon Ditty    Remission     11/24/2013 Survivorship   Colonoscopy- somewhat fibrotic/friable anastomotic mucosal-status post biopsy (narrowing not felt to be clinically significant).  Negative pathology for malignancy   09/12/2014 Imaging   DVT in the left femoral venous system, left common iliac vein, IVC, and within the IVC filter Right lower extremity venography confirms chronic occlusion of the femoral venous system with collateralization. The right iliac venous system is patent and do   09/25/2014 PET scan   The right middle lobe pulmonary nodule is hypermetabolic, favored to represent a primary bronchogenic carcinoma.Equivocal mediastinal nodes, similar to surrounding blood pool. Bilateral adrenal hypermetabolism, felt to be physiologic   10/24/2014 Pathology Results   Lung, needle/core biopsy(ies), RML - ADENOCARCINOMA, SEE COMMENT metastatic adenocarcinoma of a colorectal primary   10/24/2014 Relapse/Recurrence     11/16/2014 Definitive Surgery   Bronchoscopy, right video-assisted thoracoscopy, wedge resection of right middle lobe by Dr. Servando Snare    11/16/2014 Pathology Results   Lung, wedge biopsy/resection, right lingula and small portion of middle lobe - METASTATIC ADENOCARCINOMA, CONSISTENT WITH COLORECTAL PRIMARY, SPANNING 2.0 CM. - THE SURGICAL RESECTION MARGINS ARE NEGATIVE FOR ADENOCARCINOMA.   11/16/2014 Remission   THE SURGICAL RESECTION MARGINS ARE NEGATIVE FOR ADENOCARCINOMA.   03/07/2015 Imaging   CT CAP- Interval resection of right middle lobe metastasis. No acute process or evidence of metastatic disease in the chest, abdomen or pelvis. Improved right upper lobe reticular nodular opacities are favored to represent resolving infection.   09/14/2015 Imaging   CT CAP- No findings of recurrent malignancy. No recurrence along the wedge resection site of the right middle lobe. Right anterior abdominal wall focal hernia containing a knuckle of small bowel without complicating feature.   06/13/2016 Imaging   CT CAP- 1. New right-sided pleural metastasis/mass. Other smaller right-sided pulmonary nodules which are all pleural-based and most likely represent pleural metastasis. 2. No convincing evidence of abdominopelvic nodal metastasis. 3. Constellation of findings, including pancreatic atrophy, duct dilatation, and pancreatic head soft tissue fullness which are highly suspicious for pancreatic adenocarcinoma. Metastatic disease felt much less likely. Consider endoscopic ultrasound sampling or ERCP. Cannot exclude superimposed acute pancreatitis. 4. New enlargement of the appendiceal tip with subtle surrounding edema. Cannot exclude early or mild appendicitis. 5. Coronary artery atherosclerosis. Aortic atherosclerosis. 6. 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   06/13/2016 Progression   CT scan demonstrates progression of disease   06/19/2016 Procedure   EUS with FNA by Dr. Ardis Hughs   06/20/2016 Pathology Results   FINE NEEDLE ASPIRATION, ENDOSCOPIC, PANCREAS UNCINATE AREA(SPECIMEN 1 OF 1 COLLECTED  06/19/16): MALIGNANT CELLS CONSISTENT WITH METASTATIC ADENOCARCINOMA.  06/25/2016 PET scan   1. Two pleural-based nodules in the right hemithorax are hypermetabolic. Metastatic disease is a distinct consideration. The scattered pulmonary parenchymal nodule seen on previous diagnostic CT imaging are below the threshold for reliable resolution on PET imaging. 2. Hypermetabolic lesion pancreatic head, consistent with neoplasm. As noted on prior CT, adenocarcinoma is any consideration. No evidence for hypermetabolic abdominal lymphadenopathy. Mottled uptake noted in the liver, but no discrete hepatic metastases are evident on PET imaging. 3. Appendix remains distended up to 0.9-10 mm diameter with a stone towards the tip in subtle periappendiceal edema/inflammation. The appendix is hypermetabolic along its length. Imaging features are relatively stable in the 12 day interval since prior CT scan. While appendicitis is a consideration, the relative stability over 12 days would be unusual for that etiology. Continued close follow-up recommended.   06/26/2016 Pathology Results   Not enough tissue for foundationONE or K-ras testing.   07/07/2016 Procedure   Port placed by Dr. Arnoldo Morale   07/08/2016 -  Chemotherapy   The patient had pegfilgrastim (NEULASTA) injection 6 mg, 6 mg, Subcutaneous,  Once, 0 of 4 cycles  ondansetron (ZOFRAN) IVPB 8 mg, 8 mg, Intravenous,  Once, 0 of 4 cycles  leucovorin 804 mg in dextrose 5 % 250 mL infusion, 400 mg/m2, Intravenous,  Once, 0 of 4 cycles  oxaliplatin (ELOXATIN) 170 mg in dextrose 5 % 500 mL chemo infusion, 85 mg/m2, Intravenous,  Once, 0 of 4 cycles  fluorouracil (ADRUCIL) 4,800 mg in sodium chloride 0.9 % 150 mL chemo infusion, 2,400 mg/m2 = 4,800 mg, Intravenous, 1 Day/Dose, 0 of 4 cycles  fluorouracil (ADRUCIL) chemo injection 800 mg, 400 mg/m2, Intravenous,  Once, 0 of 4 cycles  pegfilgrastim (NEULASTA) injection 6 mg, 6 mg, Subcutaneous,  Once, 2  of 2 cycles  ondansetron (ZOFRAN) IVPB 8 mg, 8 mg, Intravenous,  Once, 12 of 12 cycles  leucovorin 660 mg in dextrose 5 % 250 mL infusion, 660 mg (original dose ), Intravenous,  Once, 12 of 12 cycles Dose modification: 660 mg (Cycle 1)  oxaliplatin (ELOXATIN) 140 mg in dextrose 5 % 500 mL chemo infusion, 140 mg (original dose ), Intravenous,  Once, 12 of 12 cycles Dose modification: 140 mg (Cycle 1), 119 mg (85 % of original dose 140 mg, Cycle 8, Reason: Provider Judgment)  fluorouracil (ADRUCIL) 3,850 mg in sodium chloride 0.9 % 150 mL chemo infusion, 3,850 mg (original dose ), Intravenous, 1 Day/Dose, 12 of 12 cycles Dose modification: 3,850 mg (Cycle 1)  fluorouracil (ADRUCIL) chemo injection 700 mg, 700 mg (original dose ), Intravenous,  Once, 12 of 12 cycles Dose modification: 700 mg (Cycle 1)  palonosetron (ALOXI) injection 0.25 mg, 0.25 mg, Intravenous,  Once, 7 of 12 cycles Administration: 0.25 mg (09/30/2016)  bevacizumab (AVASTIN) 475 mg in sodium chloride 0.9 % 100 mL chemo infusion, 5 mg/kg = 475 mg, Intravenous,  Once, 6 of 11 cycles Administration: 500 mg (09/30/2016)  leucovorin 880 mg in dextrose 5 % 250 mL infusion, 400 mg/m2 = 880 mg, Intravenous,  Once, 7 of 12 cycles Administration: 880 mg (09/30/2016)  oxaliplatin (ELOXATIN) 185 mg in dextrose 5 % 500 mL chemo infusion, 85 mg/m2 = 185 mg, Intravenous,  Once, 7 of 12 cycles Administration: 185 mg (09/30/2016)  fluorouracil (ADRUCIL) chemo injection 900 mg, 400 mg/m2 = 900 mg, Intravenous,  Once, 7 of 12 cycles Administration: 900 mg (09/30/2016)  fluorouracil (ADRUCIL) 5,300 mg in sodium chloride 0.9 % 144 mL chemo infusion, 2,400 mg/m2 = 5,300  mg, Intravenous, 1 Day/Dose, 7 of 12 cycles Administration: 5,300 mg (09/30/2016)  for chemotherapy treatment.     08/27/2016 Imaging   CT CAP- 1. Pulmonary metastatic disease is stable. 2. Pancreatic head mass, grossly stable, with progressive atrophy of the body and tail  of the pancreas. Stable peripancreatic lymph node. 3. Mild circumferential rectal wall thickening, stable. 4. Aortic atherosclerosis (ICD10-170.0). Coronary artery calcification. 5. Hyperattenuating lesion off the lower pole left kidney, stable, too small to characterize. Continued attention on followup exams is warranted. 6. Persistent wall thickening and mild dilatation of the proximal appendix, of uncertain etiology. Continued attention on followup exams is warranted.   11/06/2016 Imaging   CT C/A/P: IMPRESSION: 1. Stable pulmonary metastatic disease. 2. Similar appearing pancreatic head mass. Progressed atrophy of the pancreatic body and tail. Stable peripancreatic lymph node. 3. Stable hypoattenuating lesion partially exophytic off the inferior pole of the left kidney. 4. Aortic atherosclerosis. 5. Persistent mild wall thickening and mild dilatation of the appendix.    01/30/2017 Imaging   Ct C/A/P: IMPRESSION: 1. Stable pulmonary metastatic disease. 2. Stable to slight increase in size of pancreatic head mass. 3.  Aortic Atherosclerosis (ICD10-I70.0). 4. Similar appearance of mild wall thickening of the appendix which contains an appendicolith.   05/01/2017 Imaging   CT C/A/P: Stable disease in the lungs, no progression of disease in the abdomen.   10/04/2018 -  Chemotherapy   The patient had palonosetron (ALOXI) injection 0.25 mg, 0.25 mg, Intravenous,  Once, 11 of 14 cycles Administration: 0.25 mg (10/04/2018), 0.25 mg (10/25/2018), 0.25 mg (12/01/2018), 0.25 mg (12/15/2018), 0.25 mg (12/29/2018), 0.25 mg (01/12/2019), 0.25 mg (01/26/2019), 0.25 mg (02/09/2019), 0.25 mg (05/11/2019), 0.25 mg (05/25/2019), 0.25 mg (06/08/2019) pegfilgrastim (NEULASTA) injection 6 mg, 6 mg, Subcutaneous, Once, 2 of 2 cycles Administration: 6 mg (10/27/2018) pegfilgrastim-cbqv (UDENYCA) injection 6 mg, 6 mg, Subcutaneous, Once, 9 of 12 cycles Administration: 6 mg (12/03/2018), 6 mg (12/17/2018), 6 mg  (12/31/2018), 6 mg (01/14/2019), 6 mg (01/28/2019), 6 mg (02/11/2019), 6 mg (05/13/2019), 6 mg (05/27/2019) bevacizumab (AVASTIN) 450 mg in sodium chloride 0.9 % 100 mL chemo infusion, 5 mg/kg = 450 mg, Intravenous,  Once, 4 of 4 cycles Administration: 400 mg (12/01/2018), 400 mg (12/15/2018), 400 mg (12/29/2018) irinotecan (CAMPTOSAR) 380 mg in sodium chloride 0.9 % 500 mL chemo infusion, 180 mg/m2 = 380 mg, Intravenous,  Once, 11 of 14 cycles Dose modification: 144 mg/m2 (original dose 180 mg/m2, Cycle 2, Reason: Provider Judgment, Comment: 20% dose reduction), 90 mg/m2 (50 % of original dose 180 mg/m2, Cycle 3, Reason: Other (see comments), Comment: [id number]) Administration: 380 mg (10/04/2018), 300 mg (10/25/2018), 200 mg (12/01/2018), 200 mg (12/15/2018), 200 mg (12/29/2018), 200 mg (01/12/2019), 200 mg (01/26/2019), 200 mg (02/09/2019), 180 mg (05/11/2019), 180 mg (05/25/2019), 180 mg (06/08/2019) leucovorin 900 mg in sodium chloride 0.9 % 250 mL infusion, 421 mg/m2 = 856 mg, Intravenous,  Once, 11 of 14 cycles Dose modification: 320 mg/m2 (original dose 400 mg/m2, Cycle 2, Reason: Provider Judgment, Comment: 20% dose reduction) Administration: 900 mg (10/04/2018), 700 mg (10/25/2018), 700 mg (12/01/2018), 700 mg (12/15/2018), 700 mg (12/29/2018), 700 mg (01/12/2019), 700 mg (01/26/2019), 700 mg (02/09/2019), 700 mg (05/11/2019), 700 mg (05/25/2019), 700 mg (06/08/2019) fluorouracil (ADRUCIL) chemo injection 850 mg, 400 mg/m2 = 850 mg, Intravenous,  Once, 11 of 14 cycles Dose modification: 320 mg/m2 (original dose 400 mg/m2, Cycle 2, Reason: Provider Judgment, Comment: 20% dose reduction) Administration: 850 mg (10/04/2018), 700 mg (10/25/2018), 700 mg (12/01/2018), 700 mg (12/15/2018),  700 mg (12/29/2018), 700 mg (01/12/2019), 700 mg (01/26/2019), 700 mg (02/09/2019), 700 mg (05/11/2019), 700 mg (05/25/2019), 700 mg (06/08/2019) fosaprepitant (EMEND) 150 mg, dexamethasone (DECADRON) 12 mg in sodium chloride 0.9 % 145 mL IVPB, , Intravenous,  Once, 9 of  12 cycles Administration:  (12/01/2018),  (12/15/2018),  (12/29/2018),  (01/12/2019),  (01/26/2019),  (02/09/2019),  (05/11/2019),  (05/25/2019),  (06/08/2019) fluorouracil (ADRUCIL) 5,000 mg in sodium chloride 0.9 % 150 mL chemo infusion, 2,325 mg/m2 = 5,150 mg, Intravenous, 1 Day/Dose, 11 of 14 cycles Dose modification: 1,920 mg/m2 (original dose 2,400 mg/m2, Cycle 2, Reason: Provider Judgment, Comment: 20% dose reduction) Administration: 5,000 mg (10/04/2018), 4,100 mg (10/25/2018), 4,100 mg (12/01/2018), 4,100 mg (12/15/2018), 4,100 mg (12/29/2018), 4,100 mg (01/12/2019), 4,100 mg (01/26/2019), 4,100 mg (02/09/2019), 4,100 mg (05/11/2019), 4,100 mg (05/25/2019), 4,100 mg (06/08/2019) bevacizumab-bvzr (ZIRABEV) 400 mg in sodium chloride 0.9 % 100 mL chemo infusion, 5 mg/kg = 400 mg (100 % of original dose 5 mg/kg), Intravenous,  Once, 3 of 6 cycles Dose modification: 5 mg/kg (original dose 5 mg/kg, Cycle 6) Administration: 400 mg (01/12/2019), 400 mg (01/26/2019), 400 mg (02/09/2019)  for chemotherapy treatment.       CANCER STAGING: Cancer Staging Rectal cancer ([place]) Staging form: Colon and Rectum, AJCC 7th Edition - Clinical: Stage IIIB (T3, N1, M0) - Signed by [name], MD on 02/04/2011 - Pathologic stage from 11/17/2014: Stage IVA (M1a) - Signed by Baird Cancer, PA-C on 09/14/2015    INTERVAL HISTORY:  Mr. Obrecht 69 y.o. male seen for follow-up of metastatic rectal cancer.  Cycle 2 of chemotherapy was on 05/25/2019.  Had nausea but denied any vomiting.  Appetite is 100% and energy levels are 75%.  Denies any abdominal cramping.  Numbness has been stable.  REVIEW OF SYSTEMS:  Review of Systems  Neurological: Positive for numbness.  All other systems reviewed and are negative.    PAST MEDICAL/SURGICAL HISTORY:  Past Medical History:  Diagnosis Date  . Chronic anticoagulation   . Colon cancer ([place]) 07/24/2010   rectal ca, inv adenocarcinoma  . Depression 04/01/2011  . Diabetes mellitus  without complication ([place])   . DVT (deep venous thrombosis) ([place]) 05/09/2011  . GERD (gastroesophageal reflux disease)   . High output ileostomy ([place]) 05/21/2011  . History of kidney stones   . HTN (hypertension)   . Hx of radiation therapy 09/02/10 to 10/14/10   pelvis  . Lung metastasis ([place])   . Neuropathy   . Peripheral vascular disease ([place])    dvt's,pe  . Pneumonia    hx x3  . Pulmonary embolism ([place])   . Rectal cancer ([place]) 01/15/2011   S/P radiation and concurrent 5-FU continuous infusion from 09/09/10- 10/10/10.  S/P proctectomy with colorectal anastomosis and diverting loop ileostomy on 11/14/10 at [facility] by Dr. Harlon Ditty. Pathology reveals a pT3b N1 with 3/20 lymph nodes.     Past Surgical History:  Procedure Laterality Date  . BILIARY STENT PLACEMENT N/A 09/16/2018   Procedure: STENT PLACEMENT;  Surgeon: Rogene Houston, MD;  [facility];  Service: Endoscopy;  Laterality: N/A;  . BIOPSY  09/24/2018   Procedure: BIOPSY;  Surgeon: Danie Binder, MD;  [facility];  Service: Endoscopy;;  . COLON SURGERY  11/14/2010   proctectomy with colorectal anastomosis and diverting loop ileostomy (temporary planned)  . COLONOSCOPY  07/2010   proximal rectal apple core mass 10-14cm from anal verge (adenocarcinoma), 2-3cm distal rectal carpet polyp s/p piecemeal snare polypectomy (  adenoma)  . COLONOSCOPY  04/21/2012   RMR: Friable,fibrotic appearing colorectal anastomosis producing some luminal narrowing-not felt to be critical. path: focal erosion with slight inflammation and hyperemia. SURVEILLANCE DUE DEC 2015  . COLONOSCOPY N/A 11/24/2013   Dr. Rourk:somewhat fibrotic/friable anastomotic mucosal-status post biopsy (narrowing not felt to be clinically significant) Single colonic diverticulum. benign polypoid rectal mucosa  . colostomy reversal  april 2013  . ERCP N/A 09/16/2018   Procedure: ENDOSCOPIC RETROGRADE CHOLANGIOPANCREATOGRAPHY (ERCP);  Surgeon: Rogene Houston, MD;  [facility];  Service: Endoscopy;  Laterality: N/A;  . ESOPHAGOGASTRODUODENOSCOPY  07/2010   RMR: schatki ring s/p dilation, small hh, SB bx benign  . ESOPHAGOGASTRODUODENOSCOPY N/A 09/04/2015   Procedure: ESOPHAGOGASTRODUODENOSCOPY (EGD);  Surgeon: Daneil Dolin, MD;  [facility];  Service: Endoscopy;  Laterality: N/A;  730  . EUS  08/2010   Dr. Owens Loffler. uT3N0 circumferential, nearly obstruction rectosigmoid adenocarcinoma, distal edge 12cm from anal verge  . EUS N/A 06/19/2016   Procedure: UPPER ENDOSCOPIC ULTRASOUND (EUS) RADIAL;  Surgeon: Milus Banister, MD;  Location: [facility];  Service: Endoscopy;  Laterality: N/A;  . FLEXIBLE SIGMOIDOSCOPY N/A 09/24/2018   Procedure: FLEXIBLE SIGMOIDOSCOPY;  Surgeon: Danie Binder, MD;  [facility];  Service: Endoscopy;  Laterality: N/A;  . HERNIA REPAIR     abd hernia repair  . IVC filter    . ivc filter    . port a cath placement    . PORT-A-CATH REMOVAL  09/24/2011   Procedure: REMOVAL PORT-A-CATH;  Surgeon: Donato Heinz, MD;  Location: [facility];  Service: General;  Laterality: N/A;  Minor Room  . PORTACATH PLACEMENT Right 07/07/2016   Procedure: INSERTION PORT-A-CATH;  Surgeon: Aviva Signs, MD;  Location: [facility];  Service: General;  Laterality: Right;  . SPHINCTEROTOMY N/A 09/16/2018   Procedure: SPHINCTEROTOMY with balloon dialation;  Surgeon: Rogene Houston, MD;  [facility];  Service: Endoscopy;  Laterality: N/A;  . TRANSVERSE LOOP COLOSTOMY N/A 09/29/2018   Procedure: TRANSVERSE LOOP COLOSTOMY;  Surgeon: Aviva Signs, MD;  Location: [facility];  Service: General;  Laterality: N/A;  . VIDEO ASSISTED THORACOSCOPY (VATS)/WEDGE RESECTION Right 11/15/2014   Procedure: VIDEO ASSISTED THORACOSCOPY (VATS)/LUNG RESECTION WITH RIGHT LINGULECTOMY;  Surgeon: [name], MD;  Location: [place] [facility];  Service: Thoracic;  Laterality: Right;  Marland Kitchen VIDEO BRONCHOSCOPY N/A 11/15/2014   Procedure: VIDEO  BRONCHOSCOPY;  Surgeon: [name], MD;  Location: [facility] OR;  Service: Thoracic;  Laterality: N/A;     SOCIAL HISTORY:  Social History   Socioeconomic History  . Marital status: Married    Spouse name: Not on file  . Number of children: 2  . Years of education: Not on file  . Highest education level: Not on file  Occupational History  . Occupation: self-employed Regulatory affairs officer, currently not working    Fish farm manager: SELF EMPLOYED  Tobacco Use  . Smoking status: Never Smoker  . Smokeless tobacco: Never Used  Substance and Sexual Activity  . Alcohol use: No  . Drug use: No  . Sexual activity: Yes    Birth control/protection: None    Comment: married  Other Topics Concern  . Not on file  Social History Narrative  . Not on file   Social Determinants of Health   Financial Resource Strain: Low Risk   . Difficulty of Paying Living Expenses: Not very hard  Food Insecurity: No Food Insecurity  . Worried About Charity fundraiser in  the Last Year: Never true  . Ran Out of Food in the Last Year: Never true  Transportation Needs: No Transportation Needs  . Lack of Transportation (Medical): No  . Lack of Transportation (Non-Medical): No  Physical Activity: Inactive  . Days of Exercise per Week: 0 days  . Minutes of Exercise per Session: 0 min  Stress: No Stress Concern Present  . Feeling of Stress : Only a little  Social Connections: Unknown  . Frequency of Communication with Friends and Family: More than three times a week  . Frequency of Social Gatherings with Friends and Family: Once a week  . Attends Religious Services: Patient refused  . Active Member of Clubs or Organizations: Patient refused  . Attends Archivist Meetings: Patient refused  . Marital Status: Patient refused  Intimate Partner Violence: Unknown  . Fear of Current or Ex-Partner: Patient refused  . Emotionally Abused: Patient refused  . Physically Abused: Patient refused  .  Sexually Abused: Patient refused    FAMILY HISTORY:  Family History  Problem Relation Age of Onset  . Cancer Brother        throat  . Cancer Brother        prostate  . Colon cancer Neg Hx   . Liver disease Neg Hx   . Inflammatory bowel disease Neg Hx     CURRENT MEDICATIONS:  Outpatient Encounter Medications as of 06/08/2019  Medication Sig  . oxyCODONE-acetaminophen (PERCOCET) 10-325 MG tablet Take 1 tablet by mouth every 8 (eight) hours as needed for pain.  Marland Kitchen acetaminophen (TYLENOL) 500 MG tablet Take 1,000 mg by mouth every 6 (six) hours as needed for mild pain or moderate pain.   . clonazePAM (KLONOPIN) 1 MG tablet Take 1 tablet (1 mg total) by mouth 3 (three) times daily as needed. (Patient not taking: Reported on 05/25/2019)  . Diphenhyd-Hydrocort-Nystatin (FIRST-DUKES MOUTHWASH) SUSP Use as directed 5 mLs in the mouth or throat 4 (four) times daily. (Patient taking differently: Use as directed 5 mLs in the mouth or throat 2 (two) times daily. )  . dronabinol (MARINOL) 5 MG capsule Take 1 capsule (5 mg total) by mouth 2 (two) times daily before a meal.  . gabapentin (NEURONTIN) 300 MG capsule Take 3 capsules (900 mg total) by mouth 2 (two) times daily.  Marland Kitchen glimepiride (AMARYL) 2 MG tablet Take 2 mg by mouth 2 (two) times daily before a meal.   . magnesium oxide (MAG-OX) 400 (241.3 Mg) MG tablet Take 1 tablet (400 mg total) by mouth 2 (two) times daily.  . mirtazapine (REMERON) 15 MG tablet Take 7.5 mg by mouth at bedtime.  . ondansetron (ZOFRAN) 4 MG tablet Take 1 tablet (4 mg total) by mouth daily as needed for nausea or vomiting. (Patient not taking: Reported on 05/25/2019)  . pantoprazole (PROTONIX) 40 MG tablet Take 40 mg by mouth every evening.   . Probiotic Product (PROBIOTIC DAILY PO) Take 1 capsule by mouth 2 (two) times daily.   . rivaroxaban (XARELTO) 20 MG TABS tablet Take 1 tablet (20 mg total) by mouth daily with supper.  . vitamin B-12 (CYANOCOBALAMIN) 1000 MCG tablet  Take 1,000 mcg by mouth daily.   Facility-Administered Encounter Medications as of 06/08/2019  Medication  . sodium chloride 0.9 % injection 10 mL  . sodium chloride flush (NS) 0.9 % injection 10 mL    ALLERGIES:  Allergies  Allergen Reactions  . Oxycodone     Blisters, hallucinations  . Tramadol  Blisters, hallucinations  . Trazodone And Nefazodone Other (See Comments)    hallucinations     PHYSICAL EXAM:  ECOG Performance status: 1  Vitals:   06/08/19 0827  BP: 125/65  Pulse: 80  Resp: 18  Temp: (!) 97.1 F (36.2 C)  SpO2: 100%   Filed Weights   06/08/19 0827  Weight: 165 lb 14.4 oz (75.3 kg)    Physical Exam Constitutional:      Appearance: Normal appearance.  HENT:     Head: Normocephalic.     Nose: Nose normal.     Mouth/Throat:     Mouth: Mucous membranes are moist.     Pharynx: Oropharynx is clear.  Eyes:     Extraocular Movements: Extraocular movements intact.     Conjunctiva/sclera: Conjunctivae normal.  Cardiovascular:     Rate and Rhythm: Regular rhythm. Tachycardia present.     Pulses: Normal pulses.     Heart sounds: Normal heart sounds.  Pulmonary:     Effort: Pulmonary effort is normal.     Breath sounds: Normal breath sounds.  Abdominal:     General: Abdomen is flat. Bowel sounds are normal.     Palpations: Abdomen is soft.  Musculoskeletal:        General: Normal range of motion.     Cervical back: Normal range of motion.  Skin:    General: Skin is warm and dry.  Neurological:     General: No focal deficit present.     Mental Status: He is alert and oriented to person, place, and time.  Psychiatric:        Mood and Affect: Mood normal.        Behavior: Behavior normal.        Thought Content: Thought content normal.        Judgment: Judgment normal.      LABORATORY DATA:  I have reviewed the labs as listed.  CBC    Component Value Date/Time   WBC 11.4 (H) 06/08/2019 0840   RBC 3.97 (L) 06/08/2019 0840   HGB 11.0 (L)  06/08/2019 0840   HGB 11.6 (L) 02/04/2011 1059   HCT 36.2 (L) 06/08/2019 0840   HCT 34.7 (L) 02/04/2011 1059   PLT 223 06/08/2019 0840   PLT 282 02/04/2011 1059   MCV 91.2 06/08/2019 0840   MCV 76.0 (L) 02/04/2011 1059   MCH 27.7 06/08/2019 0840   MCHC 30.4 06/08/2019 0840   RDW 16.5 (H) 06/08/2019 0840   RDW 18.5 (H) 02/04/2011 1059   LYMPHSABS 2.1 06/08/2019 0840   LYMPHSABS 1.1 02/04/2011 1059   MONOABS 0.7 06/08/2019 0840   MONOABS 0.5 02/04/2011 1059   EOSABS 0.1 06/08/2019 0840   EOSABS 0.1 02/04/2011 1059   BASOSABS 0.1 06/08/2019 0840   BASOSABS 0.1 02/04/2011 1059   CMP Latest Ref Rng & Units 06/08/2019 05/25/2019 05/11/2019  Glucose 70 - 99 mg/dL 275(H) 235(H) 189(H)  BUN 8 - 23 mg/dL '9 8 13  '$ Creatinine 0.61 - 1.24 mg/dL 0.73 0.70 0.66  Sodium 135 - 145 mmol/L 137 136 138  Potassium 3.5 - 5.1 mmol/L 4.1 4.1 4.0  Chloride 98 - 111 mmol/L 105 101 103  CO2 22 - 32 mmol/L '25 24 25  '$ Calcium 8.9 - 10.3 mg/dL 8.2(L) 8.3(L) 8.6(L)  Total Protein 6.5 - 8.1 g/dL 5.1(L) 5.6(L) 6.1(L)  Total Bilirubin 0.3 - 1.2 mg/dL 0.5 0.4 1.0  Alkaline Phos 38 - 126 U/L 200(H) 149(H) 141(H)  AST 15 - 41 U/L 22 19  25  ALT 0 - 44 U/L 44 21 17    I have personally reviewed the scans and discussed with the patient.   ASSESSMENT & PLAN:   Rectal cancer ([place]) 1.  Stage IV rectal cancer with lung and pancreatic metastasis: Foundation 1 CDX shows KRAS G13D and NRAS wild type, MS-stable. -12 cycles of FOLFOX in the adjuvant setting from October 12 through 07/14/2011. -15 cycles of FOLFOX and bevacizumab from 07/08/2016 through 01/20/2017 followed by infusional 5-FU and bevacizumab until 07/14/2017. -Maintenance Xeloda from 08/07/2017 with bevacizumab through 08/19/2018 with progression. - ERCP and stent placement on 09/16/2018 showing single localized biliary stricture.  Biliary sphincterotomy was performed with stricture dilated to 6 mm with balloon to facilitate stent placement.  1 covered metal stent was  placed. -Flexible sigmoidoscopy on 09/24/2018 showing narrowing of the colorectal anastomosis.  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 - 2 cycles of FOLFIRI from 10/04/2018 through 10/25/2018 with 20% dose reduction during cycle 2. - He has developed severe tiredness after cycle 2 even after dose reduction. 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  -Cycle 8 of chemotherapy with FOLFIRI and bevacizumab on 02/09/2019. -He was admitted to the hospital from 02/22/2019-02/26/2019 with perforated appendicitis which was managed conservatively. -He was readmitted on 03/10/2019 through 03/14/2019.  He had similar symptoms with right lower quadrant pain.  A pigtail drain was placed on 03/12/2019 by IR.  Drain discontinued on March 22, 2019. -CEA on March 24, 2019 increased to 10. -CT CAP from April 19, 2019 was reviewed which showed slight interval enlargement of a subpleural nodule of the posterior right upper lobe, measuring 1.2 cm, previously 1.0 cm.  Interval enlargement of a subpleural nodule of the dependent right lower lobe measuring 1.1, previously 0.7.  New hypodense inferior right lobe of the liver lesion measuring 8 mm suspicious for mets.  Pancreatic head mass measures 3.1 x 2.3 cm, previously 2.6 x 2.1 cm.  Sclerotic lesion of the posterior right 10th rib, with increasing adjacent soft tissue component. -2 cycles of reduced dose FOLFIRI on 05/11/2019 and 05/25/2019. -Reports some nausea but denied any vomiting. -We reviewed his labs.  He will proceed with cycle 3 without any dose modifications.  I will continue to hold of his bevacizumab. -We will see him back in 2 weeks.  I plan to repeat scans after cycle 6.   2.  Weight loss: -He gained 3 pounds.  He is not taking any Marinol.  3.  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  He will continue Xarelto.  No bleeding issues.  4.   Anxiety: -He will continue Klonopin 1 mg twice daily.  5.  Right-sided posterior rib pain: -This is from malignancy to the ribs.  He will continue Percocet 10/325 up to 4 times a day.  6.  Hypomagnesemia: -Magnesium is 1.7 today.  He will continue magnesium twice daily at home.  7.-Neuropathy: -He will continue gabapentin 300 mg 3 times a day.    Orders placed this encounter:  No orders of the defined types were placed in this encounter.     Derek Jack, MD  [place] [phone]

## 2019-06-08 NOTE — Patient Instructions (Signed)
[facility] Discharge Instructions for Patients Receiving Chemotherapy   Beginning January 23rd 2017 lab work for the [facility] will be done in the  Main lab at [facility] on 1st floor. If you have a lab appointment with the [facility] please come in thru the  Main Entrance and check in at the main information desk   Today you received the following chemotherapy agents Irinotecan,Leucovorin and 5FU. Follow-up as scheduled. Call clinic for any questions or concerns  To help prevent nausea and vomiting after your treatment, we encourage you to take your nausea medication   If you develop nausea and vomiting, or diarrhea that is not controlled by your medication, call the clinic.  The clinic phone number is [phone]. Office hours are Monday-Friday 8:30am-5:00pm.  BELOW ARE SYMPTOMS THAT SHOULD BE REPORTED IMMEDIATELY:  *FEVER GREATER THAN 101.0 F  *CHILLS WITH OR WITHOUT FEVER  NAUSEA AND VOMITING THAT IS NOT CONTROLLED WITH YOUR NAUSEA MEDICATION  *UNUSUAL SHORTNESS OF BREATH  *UNUSUAL BRUISING OR BLEEDING  TENDERNESS IN MOUTH AND THROAT WITH OR WITHOUT PRESENCE OF ULCERS  *URINARY PROBLEMS  *BOWEL PROBLEMS  UNUSUAL RASH Items with * indicate a potential emergency and should be followed up as soon as possible. If you have an emergency after office hours please contact your primary care physician or go to the nearest emergency department.  Please call the clinic during office hours if you have any questions or concerns.   You may also contact the Patient Navigator at [phone] should you have any questions or need assistance in obtaining follow up care.      Resources For Cancer Patients and their Caregivers ? American Cancer Society: Can assist with transportation, wigs, general needs, runs Look Good Feel Better.        3100946570 ? Cancer Care: Provides financial assistance, online support groups, medication/co-pay assistance.   1-800-813-HOPE 2176225986) ? [place] Assists [place] cancer patients and their families through emotional , educational and financial support.  346-826-7918 ? [place] DSS Where to apply for food stamps, Medicaid and utility assistance. 7753258545 ? [facility]: Transportation to medical appointments. (562)797-3963 ? Social Security Administration: May apply for disability if have a Stage IV cancer. 367-523-8831 667-061-3805 ? [employer], Disability and Transit Services: Assists with nutrition, care and transit needs. [phone]

## 2019-06-08 NOTE — Patient Instructions (Addendum)
[facility] at [facility] Discharge Instructions  You were seen today by Dr. Katragadda. He went over your recent lab results. He will see you back in 2 weeks for labs, treatment and follow up.   Thank you for choosing [facility] at [facility] to provide your oncology and hematology care.  To afford each patient quality time with our provider, please arrive at least 15 minutes before your scheduled appointment time.   If you have a lab appointment with the Cancer Center please come in thru the  Main Entrance and check in at the main information desk  You need to re-schedule your appointment should you arrive 10 or more minutes late.  We strive to give you quality time with our providers, and arriving late affects you and other patients whose appointments are after yours.  Also, if you no show three or more times for appointments you may be dismissed from the clinic at the providers discretion.     Again, thank you for choosing  Cancer Center.  Our hope is that these requests will decrease the amount of time that you wait before being seen by our physicians.       _____________________________________________________________  Should you have questions after your visit to  Cancer Center, please contact our office at [phone] between the hours of 8:00 a.m. and 4:30 p.m.  Voicemails left after 4:00 p.m. will not be returned until the following business day.  For prescription refill requests, have your pharmacy contact our office and allow 72 hours.    Cancer Center Support Programs:   > Cancer Support Group  2nd Tuesday of the month 1pm-2pm, Journey Room    

## 2019-06-08 NOTE — Progress Notes (Signed)
0922 Labs reviewed with and pt seen by Dr. [name] and pt approved for FOLFIRI infusion with Avastin to be held today per MD                                                                                                    [name] tolerated chemo tx well without complaints or incident. Pt discharged with 5FU pump infusing without issues. VSS upon discharge. Pt discharged self ambulatory in satisfactory condition

## 2019-06-08 NOTE — Assessment & Plan Note (Signed)
1.  Stage IV rectal cancer with lung and pancreatic metastasis: Foundation 1 CDX shows KRAS G13D and NRAS wild type, MS-stable. -12 cycles of FOLFOX in the adjuvant setting from October 12 through 07/14/2011. -15 cycles of FOLFOX and bevacizumab from 07/08/2016 through 01/20/2017 followed by infusional 5-FU and bevacizumab until 07/14/2017. -Maintenance Xeloda from 08/07/2017 with bevacizumab through 08/19/2018 with progression. - ERCP and stent placement on 09/16/2018 showing single localized biliary stricture.  Biliary sphincterotomy was performed with stricture dilated to 6 mm with balloon to facilitate stent placement.  1 covered metal stent was placed. -Flexible sigmoidoscopy on 09/24/2018 showing narrowing of the colorectal anastomosis.  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 - 2 cycles of FOLFIRI from 10/04/2018 through 10/25/2018 with 20% dose reduction during cycle 2. - He has developed severe tiredness after cycle 2 even after dose reduction. 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  -Cycle 8 of chemotherapy with FOLFIRI and bevacizumab on 02/09/2019. -He was admitted to the hospital from 02/22/2019-02/26/2019 with perforated appendicitis which was managed conservatively. -He was readmitted on 03/10/2019 through 03/14/2019.  He had similar symptoms with right lower quadrant pain.  A pigtail drain was placed on 03/12/2019 by IR.  Drain discontinued on March 22, 2019. -CEA on March 24, 2019 increased to 10. -CT CAP from April 19, 2019 was reviewed which showed slight interval enlargement of a subpleural nodule of the posterior right upper lobe, measuring 1.2 cm, previously 1.0 cm.  Interval enlargement of a subpleural nodule of the dependent right lower lobe measuring 1.1, previously 0.7.  New hypodense inferior right lobe of the liver lesion measuring 8 mm suspicious for mets.  Pancreatic head mass  measures 3.1 x 2.3 cm, previously 2.6 x 2.1 cm.  Sclerotic lesion of the posterior right 10th rib, with increasing adjacent soft tissue component. -2 cycles of reduced dose FOLFIRI on 05/11/2019 and 05/25/2019. -Reports some nausea but denied any vomiting. -We reviewed his labs.  He will proceed with cycle 3 without any dose modifications.  I will continue to hold of his bevacizumab. -We will see him back in 2 weeks.  I plan to repeat scans after cycle 6.   2.  Weight loss: -He gained 3 pounds.  He is not taking any Marinol.  3.  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  He will continue Xarelto.  No bleeding issues.  4.  Anxiety: -He will continue Klonopin 1 mg twice daily.  5.  Right-sided posterior rib pain: -This is from malignancy to the ribs.  He will continue Percocet 10/325 up to 4 times a day.  6.  Hypomagnesemia: -Magnesium is 1.7 today.  He will continue magnesium twice daily at home.  7.-Neuropathy: -He will continue gabapentin 300 mg 3 times a day.

## 2019-06-10 ENCOUNTER — Other Ambulatory Visit: Payer: Self-pay

## 2019-06-10 ENCOUNTER — Inpatient Hospital Stay (HOSPITAL_COMMUNITY): Payer: Medicare Other

## 2019-06-10 VITALS — BP 113/65 | HR 69 | Temp 97.7°F | Resp 18

## 2019-06-10 DIAGNOSIS — G620 Drug-induced polyneuropathy: Secondary | ICD-10-CM

## 2019-06-10 DIAGNOSIS — C189 Malignant neoplasm of colon, unspecified: Secondary | ICD-10-CM | POA: Diagnosis not present

## 2019-06-10 DIAGNOSIS — C20 Malignant neoplasm of rectum: Secondary | ICD-10-CM

## 2019-06-10 DIAGNOSIS — T451X5A Adverse effect of antineoplastic and immunosuppressive drugs, initial encounter: Secondary | ICD-10-CM

## 2019-06-10 DIAGNOSIS — C7800 Secondary malignant neoplasm of unspecified lung: Secondary | ICD-10-CM | POA: Diagnosis not present

## 2019-06-10 DIAGNOSIS — C7889 Secondary malignant neoplasm of other digestive organs: Secondary | ICD-10-CM | POA: Diagnosis not present

## 2019-06-10 DIAGNOSIS — Z5111 Encounter for antineoplastic chemotherapy: Secondary | ICD-10-CM | POA: Diagnosis not present

## 2019-06-10 DIAGNOSIS — Z5189 Encounter for other specified aftercare: Secondary | ICD-10-CM | POA: Diagnosis not present

## 2019-06-10 MED ORDER — PEGFILGRASTIM-CBQV 6 MG/0.6ML ~~LOC~~ SOSY
PREFILLED_SYRINGE | SUBCUTANEOUS | Status: AC
  Filled 2019-06-10: qty 0.6

## 2019-06-10 MED ORDER — HEPARIN SOD (PORK) LOCK FLUSH 100 UNIT/ML IV SOLN
500.0000 [IU] | Freq: Once | INTRAVENOUS | Status: AC | PRN
  Administered 2019-06-10: 500 [IU]

## 2019-06-10 MED ORDER — OXYCODONE-ACETAMINOPHEN 10-325 MG PO TABS: 1.0000 | ORAL_TABLET | Freq: Three times a day (TID) | ORAL | 0 refills | Status: DC | PRN

## 2019-06-10 MED ORDER — PEGFILGRASTIM-CBQV 6 MG/0.6ML ~~LOC~~ SOSY
6.0000 mg | PREFILLED_SYRINGE | Freq: Once | SUBCUTANEOUS | Status: AC
  Administered 2019-06-10: 6 mg via SUBCUTANEOUS

## 2019-06-10 MED ORDER — GABAPENTIN 300 MG PO CAPS: 900.0000 mg | ORAL_CAPSULE | Freq: Two times a day (BID) | ORAL | 0 refills | Status: AC

## 2019-06-10 MED ORDER — SODIUM CHLORIDE 0.9% FLUSH
10.0000 mL | INTRAVENOUS | Status: DC | PRN
  Administered 2019-06-10: 10 mL

## 2019-06-10 NOTE — Progress Notes (Signed)
Charles [name] presented for Portacath flush with pump d/c.  Portacath located in the right chest wall. Clean, Dry and Intact Good blood return present. Portacath flushed with 18ml NS and 500U/80ml Heparin per protocol and needle removed intact. Procedure without incident. Patient tolerated procedure well.  No complaints at this time. Discharged from clinic ambulatory. F/U with [facility] as scheduled.

## 2019-06-10 NOTE — Patient Instructions (Signed)
[facility] Discharge Instructions for Patients Receiving Chemotherapy  Today you received the following chemotherapy agents   To help prevent nausea and vomiting after your treatment, we encourage you to take your nausea medication   If you develop nausea and vomiting that is not controlled by your nausea medication, call the clinic.   BELOW ARE SYMPTOMS THAT SHOULD BE REPORTED IMMEDIATELY:  *FEVER GREATER THAN 100.5 F  *CHILLS WITH OR WITHOUT FEVER  NAUSEA AND VOMITING THAT IS NOT CONTROLLED WITH YOUR NAUSEA MEDICATION  *UNUSUAL SHORTNESS OF BREATH  *UNUSUAL BRUISING OR BLEEDING  TENDERNESS IN MOUTH AND THROAT WITH OR WITHOUT PRESENCE OF ULCERS  *URINARY PROBLEMS  *BOWEL PROBLEMS  UNUSUAL RASH Items with * indicate a potential emergency and should be followed up as soon as possible.  Feel free to call the clinic should you have any questions or concerns. The clinic phone number is [phone].  Please show the CHEMO ALERT CARD at check-in to the Emergency Department and triage nurse.   

## 2019-06-15 DIAGNOSIS — I10 Essential (primary) hypertension: Secondary | ICD-10-CM | POA: Diagnosis not present

## 2019-06-15 DIAGNOSIS — E119 Type 2 diabetes mellitus without complications: Secondary | ICD-10-CM | POA: Diagnosis not present

## 2019-06-22 ENCOUNTER — Inpatient Hospital Stay (HOSPITAL_COMMUNITY): Payer: Medicare Other | Admitting: Hematology

## 2019-06-22 ENCOUNTER — Encounter (HOSPITAL_COMMUNITY): Payer: Self-pay | Admitting: Hematology

## 2019-06-22 ENCOUNTER — Inpatient Hospital Stay (HOSPITAL_COMMUNITY): Payer: Medicare Other

## 2019-06-22 ENCOUNTER — Other Ambulatory Visit: Payer: Self-pay

## 2019-06-22 VITALS — BP 104/57 | HR 78 | Temp 96.8°F | Resp 18

## 2019-06-22 VITALS — BP 125/82 | HR 92 | Temp 97.1°F | Resp 18 | Wt 164.6 lb

## 2019-06-22 DIAGNOSIS — C20 Malignant neoplasm of rectum: Secondary | ICD-10-CM

## 2019-06-22 DIAGNOSIS — C7800 Secondary malignant neoplasm of unspecified lung: Secondary | ICD-10-CM | POA: Diagnosis not present

## 2019-06-22 DIAGNOSIS — Z5111 Encounter for antineoplastic chemotherapy: Secondary | ICD-10-CM | POA: Diagnosis not present

## 2019-06-22 DIAGNOSIS — C189 Malignant neoplasm of colon, unspecified: Secondary | ICD-10-CM | POA: Diagnosis not present

## 2019-06-22 DIAGNOSIS — Z5189 Encounter for other specified aftercare: Secondary | ICD-10-CM | POA: Diagnosis not present

## 2019-06-22 DIAGNOSIS — C7889 Secondary malignant neoplasm of other digestive organs: Secondary | ICD-10-CM | POA: Diagnosis not present

## 2019-06-22 LAB — COMPREHENSIVE METABOLIC PANEL
ALT: 18 U/L (ref 0–44)
AST: 18 U/L (ref 15–41)
Albumin: 2.9 g/dL — ABNORMAL LOW (ref 3.5–5.0)
Alkaline Phosphatase: 189 U/L — ABNORMAL HIGH (ref 38–126)
Anion gap: 7 (ref 5–15)
BUN: 8 mg/dL (ref 8–23)
CO2: 26 mmol/L (ref 22–32)
Calcium: 8.3 mg/dL — ABNORMAL LOW (ref 8.9–10.3)
Chloride: 104 mmol/L (ref 98–111)
Creatinine, Ser: 0.75 mg/dL (ref 0.61–1.24)
GFR calc Af Amer: >60 mL/min (ref >60)
GFR calc non Af Amer: >60 mL/min (ref >60)
Glucose, Bld: 223 mg/dL — ABNORMAL HIGH (ref 70–99)
Potassium: 4.3 mmol/L (ref 3.5–5.1)
Sodium: 137 mmol/L (ref 135–145)
Total Bilirubin: 0.3 mg/dL (ref 0.3–1.2)
Total Protein: 5.4 g/dL — ABNORMAL LOW (ref 6.5–8.1)

## 2019-06-22 LAB — URINALYSIS, DIPSTICK ONLY
Bilirubin Urine: NEGATIVE
Glucose, UA: NEGATIVE mg/dL
Hgb urine dipstick: NEGATIVE
Ketones, ur: NEGATIVE mg/dL
Leukocytes,Ua: NEGATIVE
Nitrite: NEGATIVE
Protein, ur: NEGATIVE mg/dL
Specific Gravity, Urine: 1.02 (ref 1.005–1.030)
pH: 5 (ref 5.0–8.0)

## 2019-06-22 LAB — CBC WITH DIFFERENTIAL/PLATELET
Abs Immature Granulocytes: 0.2 10*3/uL — ABNORMAL HIGH (ref 0.00–0.07)
Basophils Absolute: 0.1 10*3/uL (ref 0.0–0.1)
Basophils Relative: 0 %
Eosinophils Absolute: 0.2 10*3/uL (ref 0.0–0.5)
Eosinophils Relative: 2 %
HCT: 38.1 % — ABNORMAL LOW (ref 39.0–52.0)
Hemoglobin: 11.5 g/dL — ABNORMAL LOW (ref 13.0–17.0)
Immature Granulocytes: 2 %
Lymphocytes Relative: 18 %
Lymphs Abs: 2 10*3/uL (ref 0.7–4.0)
MCH: 27.2 pg (ref 26.0–34.0)
MCHC: 30.2 g/dL (ref 30.0–36.0)
MCV: 90.1 fL (ref 80.0–100.0)
Monocytes Absolute: 0.8 10*3/uL (ref 0.1–1.0)
Monocytes Relative: 7 %
Neutro Abs: 8.1 10*3/uL — ABNORMAL HIGH (ref 1.7–7.7)
Neutrophils Relative %: 71 %
Platelets: 234 10*3/uL (ref 150–400)
RBC: 4.23 MIL/uL (ref 4.22–5.81)
RDW: 16 % — ABNORMAL HIGH (ref 11.5–15.5)
WBC: 11.4 10*3/uL — ABNORMAL HIGH (ref 4.0–10.5)
nRBC: 0 % (ref 0.0–0.2)

## 2019-06-22 MED ORDER — FLUOROURACIL CHEMO INJECTION 2.5 GM/50ML
320.0000 mg/m2 | Freq: Once | INTRAVENOUS | Status: AC
  Administered 2019-06-22: 700 mg via INTRAVENOUS
  Filled 2019-06-22: qty 14

## 2019-06-22 MED ORDER — SODIUM CHLORIDE 0.9 % IV SOLN
Freq: Once | INTRAVENOUS | Status: AC
  Filled 2019-06-22: qty 5

## 2019-06-22 MED ORDER — SODIUM CHLORIDE 0.9 % IV SOLN: 5.0000 mg/kg | Freq: Once | INTRAVENOUS | Status: DC

## 2019-06-22 MED ORDER — SODIUM CHLORIDE 0.9 % IV SOLN
1920.0000 mg/m2 | INTRAVENOUS | Status: DC
  Administered 2019-06-22: 14:00:00 4100 mg via INTRAVENOUS
  Filled 2019-06-22: qty 82

## 2019-06-22 MED ORDER — SODIUM CHLORIDE 0.9% FLUSH
10.0000 mL | INTRAVENOUS | Status: DC | PRN
  Administered 2019-06-22: 09:00:00 10 mL

## 2019-06-22 MED ORDER — SODIUM CHLORIDE 0.9 % IV SOLN
327.0000 mg/m2 | Freq: Once | INTRAVENOUS | Status: AC
  Administered 2019-06-22: 700 mg via INTRAVENOUS
  Filled 2019-06-22: qty 35

## 2019-06-22 MED ORDER — SODIUM CHLORIDE 0.9 % IV SOLN: Freq: Once | INTRAVENOUS | Status: AC

## 2019-06-22 MED ORDER — ATROPINE SULFATE 1 MG/ML IJ SOLN
0.5000 mg | Freq: Once | INTRAMUSCULAR | Status: AC | PRN
  Administered 2019-06-22: 0.5 mg via INTRAVENOUS
  Filled 2019-06-22: qty 1

## 2019-06-22 MED ORDER — PALONOSETRON HCL INJECTION 0.25 MG/5ML
0.2500 mg | Freq: Once | INTRAVENOUS | Status: AC
  Administered 2019-06-22: 11:00:00 0.25 mg via INTRAVENOUS
  Filled 2019-06-22: qty 5

## 2019-06-22 MED ORDER — SODIUM CHLORIDE 0.9 % IV SOLN
90.0000 mg/m2 | Freq: Once | INTRAVENOUS | Status: AC
  Administered 2019-06-22: 12:00:00 180 mg via INTRAVENOUS
  Filled 2019-06-22: qty 9

## 2019-06-22 NOTE — Assessment & Plan Note (Signed)
1.  Stage IV rectal cancer with lung and pancreatic metastasis: -Foundation 1 with K-ras G 13 D, NRAS wild-type, MSI stable. -3 cycles of FOLFIRI dose reduced from 05/11/2019 through 06/01/2019. -He tolerated last cycle reasonably well. -I have reviewed his labs including CBC and LFTs which are normal.  Creatinine was also normal at 0.75. -We will proceed with cycle 4 today.  I will continue to hold bevacizumab given his history of perforated appendicitis. -He is continuing to have problems with his ostomy site particularly when he lies down.  He will follow with Dr. Arnoldo Morale.  2.  Weight loss: -He lost about 1 pound.  He is not taking Marinol. -He has been eating very well and drinking about 2 cans of boost per day with 30 g of protein in it.  3.  Right posterior rib pain: -He also has abdominal pain in the right lower quadrant.  He is taking oxycodone 10 mg 3 times a day which is helping.  4.  Anxiety: -He will continue Klonopin 1 mg twice daily.  5.  Neuropathy: -He will continue gabapentin 3 times a day.  6.  Hypomagnesemia: -He will continue magnesium twice daily.  7.  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  He will continue Xarelto.  No bleeding issues.

## 2019-06-22 NOTE — Progress Notes (Signed)
Charles [name], [place] [id number]   CLINIC:  Medical Oncology/Hematology  PCP:  [name], MD [place] [address] [phone]   REASON FOR VISIT:  Follow-up for Stage IV Colon Cancer  CURRENT THERAPY: Dose reduced FOLFIRI.  BRIEF ONCOLOGIC HISTORY:  Oncology History  Rectal cancer ([place])  07/24/2010 Initial Diagnosis   Invasive adenocarcinoma of rectum   09/09/2010 Concurrent Chemotherapy   S/P radiation and concurrent 5-FU continuous infusion from 09/09/10- 10/10/10.   11/14/2010 Surgery   S/P proctectomy with colorectal anastomosis and diverting loop ileostomy on 11/14/10 at [facility] by Dr. Harlon Ditty. Pathology reveals a pT3b N1 with 3/20 lymph nodes.   02/05/2011 - 07/14/2011 Chemotherapy   FOLFOX   08/18/2011 Surgery   Approximate date of surgery- [place] by Dr. Harlon Ditty    Remission     11/24/2013 Survivorship   Colonoscopy- somewhat fibrotic/friable anastomotic mucosal-status post biopsy (narrowing not felt to be clinically significant).  Negative pathology for malignancy   09/12/2014 Imaging   DVT in the left femoral venous system, left common iliac vein, IVC, and within the IVC filter Right lower extremity venography confirms chronic occlusion of the femoral venous system with collateralization. The right iliac venous system is patent and do   09/25/2014 PET scan   The right middle lobe pulmonary nodule is hypermetabolic, favored to represent a primary bronchogenic carcinoma.Equivocal mediastinal nodes, similar to surrounding blood pool. Bilateral adrenal hypermetabolism, felt to be physiologic   10/24/2014 Pathology Results   Lung, needle/core biopsy(ies), RML - ADENOCARCINOMA, SEE COMMENT metastatic adenocarcinoma of a colorectal primary   10/24/2014 Relapse/Recurrence     11/16/2014 Definitive Surgery   Bronchoscopy, right video-assisted thoracoscopy, wedge resection of right middle lobe by Dr. Servando Snare     11/16/2014 Pathology Results   Lung, wedge biopsy/resection, right lingula and small portion of middle lobe - METASTATIC ADENOCARCINOMA, CONSISTENT WITH COLORECTAL PRIMARY, SPANNING 2.0 CM. - THE SURGICAL RESECTION MARGINS ARE NEGATIVE FOR ADENOCARCINOMA.   11/16/2014 Remission   THE SURGICAL RESECTION MARGINS ARE NEGATIVE FOR ADENOCARCINOMA.   03/07/2015 Imaging   CT CAP- Interval resection of right middle lobe metastasis. No acute process or evidence of metastatic disease in the chest, abdomen or pelvis. Improved right upper lobe reticular nodular opacities are favored to represent resolving infection.   09/14/2015 Imaging   CT CAP- No findings of recurrent malignancy. No recurrence along the wedge resection site of the right middle lobe. Right anterior abdominal wall focal hernia containing a knuckle of small bowel without complicating feature.   06/13/2016 Imaging   CT CAP- 1. New right-sided pleural metastasis/mass. Other smaller right-sided pulmonary nodules which are all pleural-based and most likely represent pleural metastasis. 2. No convincing evidence of abdominopelvic nodal metastasis. 3. Constellation of findings, including pancreatic atrophy, duct dilatation, and pancreatic head soft tissue fullness which are highly suspicious for pancreatic adenocarcinoma. Metastatic disease felt much less likely. Consider endoscopic ultrasound sampling or ERCP. Cannot exclude superimposed acute pancreatitis. 4. New enlargement of the appendiceal tip with subtle surrounding edema. Cannot exclude early or mild appendicitis. 5. Coronary artery atherosclerosis. Aortic atherosclerosis. 6. 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   06/13/2016 Progression   CT scan demonstrates progression of disease   06/19/2016 Procedure   EUS with FNA by Dr. Ardis Hughs   06/20/2016 Pathology Results   FINE NEEDLE ASPIRATION, ENDOSCOPIC, PANCREAS UNCINATE AREA(SPECIMEN 1 OF 1 COLLECTED  06/19/16): MALIGNANT CELLS CONSISTENT WITH METASTATIC  ADENOCARCINOMA.   06/25/2016 PET scan   1. Two pleural-based nodules in the right hemithorax are hypermetabolic. Metastatic disease is a distinct consideration. The scattered pulmonary parenchymal nodule seen on previous diagnostic CT imaging are below the threshold for reliable resolution on PET imaging. 2. Hypermetabolic lesion pancreatic head, consistent with neoplasm. As noted on prior CT, adenocarcinoma is any consideration. No evidence for hypermetabolic abdominal lymphadenopathy. Mottled uptake noted in the liver, but no discrete hepatic metastases are evident on PET imaging. 3. Appendix remains distended up to 0.9-10 mm diameter with a stone towards the tip in subtle periappendiceal edema/inflammation. The appendix is hypermetabolic along its length. Imaging features are relatively stable in the 12 day interval since prior CT scan. While appendicitis is a consideration, the relative stability over 12 days would be unusual for that etiology. Continued close follow-up recommended.   06/26/2016 Pathology Results   Not enough tissue for foundationONE or K-ras testing.   07/07/2016 Procedure   Port placed by Dr. Arnoldo Morale   07/08/2016 -  Chemotherapy   The patient had pegfilgrastim (NEULASTA) injection 6 mg, 6 mg, Subcutaneous,  Once, 0 of 4 cycles  ondansetron (ZOFRAN) IVPB 8 mg, 8 mg, Intravenous,  Once, 0 of 4 cycles  leucovorin 804 mg in dextrose 5 % 250 mL infusion, 400 mg/m2, Intravenous,  Once, 0 of 4 cycles  oxaliplatin (ELOXATIN) 170 mg in dextrose 5 % 500 mL chemo infusion, 85 mg/m2, Intravenous,  Once, 0 of 4 cycles  fluorouracil (ADRUCIL) 4,800 mg in sodium chloride 0.9 % 150 mL chemo infusion, 2,400 mg/m2 = 4,800 mg, Intravenous, 1 Day/Dose, 0 of 4 cycles  fluorouracil (ADRUCIL) chemo injection 800 mg, 400 mg/m2, Intravenous,  Once, 0 of 4 cycles  pegfilgrastim (NEULASTA) injection 6 mg, 6 mg, Subcutaneous,  Once, 2  of 2 cycles  ondansetron (ZOFRAN) IVPB 8 mg, 8 mg, Intravenous,  Once, 12 of 12 cycles  leucovorin 660 mg in dextrose 5 % 250 mL infusion, 660 mg (original dose ), Intravenous,  Once, 12 of 12 cycles Dose modification: 660 mg (Cycle 1)  oxaliplatin (ELOXATIN) 140 mg in dextrose 5 % 500 mL chemo infusion, 140 mg (original dose ), Intravenous,  Once, 12 of 12 cycles Dose modification: 140 mg (Cycle 1), 119 mg (85 % of original dose 140 mg, Cycle 8, Reason: Provider Judgment)  fluorouracil (ADRUCIL) 3,850 mg in sodium chloride 0.9 % 150 mL chemo infusion, 3,850 mg (original dose ), Intravenous, 1 Day/Dose, 12 of 12 cycles Dose modification: 3,850 mg (Cycle 1)  fluorouracil (ADRUCIL) chemo injection 700 mg, 700 mg (original dose ), Intravenous,  Once, 12 of 12 cycles Dose modification: 700 mg (Cycle 1)  palonosetron (ALOXI) injection 0.25 mg, 0.25 mg, Intravenous,  Once, 7 of 12 cycles Administration: 0.25 mg (09/30/2016)  bevacizumab (AVASTIN) 475 mg in sodium chloride 0.9 % 100 mL chemo infusion, 5 mg/kg = 475 mg, Intravenous,  Once, 6 of 11 cycles Administration: 500 mg (09/30/2016)  leucovorin 880 mg in dextrose 5 % 250 mL infusion, 400 mg/m2 = 880 mg, Intravenous,  Once, 7 of 12 cycles Administration: 880 mg (09/30/2016)  oxaliplatin (ELOXATIN) 185 mg in dextrose 5 % 500 mL chemo infusion, 85 mg/m2 = 185 mg, Intravenous,  Once, 7 of 12 cycles Administration: 185 mg (09/30/2016)  fluorouracil (ADRUCIL) chemo injection 900 mg, 400 mg/m2 = 900 mg, Intravenous,  Once, 7 of 12 cycles Administration: 900 mg (09/30/2016)  fluorouracil (ADRUCIL) 5,300 mg in sodium chloride 0.9 % 144 mL chemo infusion, 2,400  mg/m2 = 5,300 mg, Intravenous, 1 Day/Dose, 7 of 12 cycles Administration: 5,300 mg (09/30/2016)  for chemotherapy treatment.     08/27/2016 Imaging   CT CAP- 1. Pulmonary metastatic disease is stable. 2. Pancreatic head mass, grossly stable, with progressive atrophy of the body and tail  of the pancreas. Stable peripancreatic lymph node. 3. Mild circumferential rectal wall thickening, stable. 4. Aortic atherosclerosis (ICD10-170.0). Coronary artery calcification. 5. Hyperattenuating lesion off the lower pole left kidney, stable, too small to characterize. Continued attention on followup exams is warranted. 6. Persistent wall thickening and mild dilatation of the proximal appendix, of uncertain etiology. Continued attention on followup exams is warranted.   11/06/2016 Imaging   CT C/A/P: IMPRESSION: 1. Stable pulmonary metastatic disease. 2. Similar appearing pancreatic head mass. Progressed atrophy of the pancreatic body and tail. Stable peripancreatic lymph node. 3. Stable hypoattenuating lesion partially exophytic off the inferior pole of the left kidney. 4. Aortic atherosclerosis. 5. Persistent mild wall thickening and mild dilatation of the appendix.    01/30/2017 Imaging   Ct C/A/P: IMPRESSION: 1. Stable pulmonary metastatic disease. 2. Stable to slight increase in size of pancreatic head mass. 3.  Aortic Atherosclerosis (ICD10-I70.0). 4. Similar appearance of mild wall thickening of the appendix which contains an appendicolith.   05/01/2017 Imaging   CT C/A/P: Stable disease in the lungs, no progression of disease in the abdomen.   10/04/2018 -  Chemotherapy   The patient had palonosetron (ALOXI) injection 0.25 mg, 0.25 mg, Intravenous,  Once, 12 of 14 cycles Administration: 0.25 mg (10/04/2018), 0.25 mg (10/25/2018), 0.25 mg (12/01/2018), 0.25 mg (12/15/2018), 0.25 mg (12/29/2018), 0.25 mg (01/12/2019), 0.25 mg (01/26/2019), 0.25 mg (02/09/2019), 0.25 mg (05/11/2019), 0.25 mg (05/25/2019), 0.25 mg (06/08/2019), 0.25 mg (06/22/2019) pegfilgrastim (NEULASTA) injection 6 mg, 6 mg, Subcutaneous, Once, 2 of 2 cycles Administration: 6 mg (10/27/2018) pegfilgrastim-cbqv (UDENYCA) injection 6 mg, 6 mg, Subcutaneous, Once, 10 of 12 cycles Administration: 6 mg (12/03/2018), 6 mg  (12/17/2018), 6 mg (12/31/2018), 6 mg (01/14/2019), 6 mg (01/28/2019), 6 mg (02/11/2019), 6 mg (05/13/2019), 6 mg (05/27/2019), 6 mg (06/10/2019) bevacizumab (AVASTIN) 450 mg in sodium chloride 0.9 % 100 mL chemo infusion, 5 mg/kg = 450 mg, Intravenous,  Once, 4 of 4 cycles Administration: 400 mg (12/01/2018), 400 mg (12/15/2018), 400 mg (12/29/2018) irinotecan (CAMPTOSAR) 380 mg in sodium chloride 0.9 % 500 mL chemo infusion, 180 mg/m2 = 380 mg, Intravenous,  Once, 12 of 14 cycles Dose modification: 144 mg/m2 (original dose 180 mg/m2, Cycle 2, Reason: Provider Judgment, Comment: 20% dose reduction), 90 mg/m2 (50 % of original dose 180 mg/m2, Cycle 3, Reason: Other (see comments), Comment: [id number]) Administration: 380 mg (10/04/2018), 300 mg (10/25/2018), 200 mg (12/01/2018), 200 mg (12/15/2018), 200 mg (12/29/2018), 200 mg (01/12/2019), 200 mg (01/26/2019), 200 mg (02/09/2019), 180 mg (05/11/2019), 180 mg (05/25/2019), 180 mg (06/08/2019), 180 mg (06/22/2019) leucovorin 900 mg in sodium chloride 0.9 % 250 mL infusion, 421 mg/m2 = 856 mg, Intravenous,  Once, 12 of 14 cycles Dose modification: 320 mg/m2 (original dose 400 mg/m2, Cycle 2, Reason: Provider Judgment, Comment: 20% dose reduction) Administration: 900 mg (10/04/2018), 700 mg (10/25/2018), 700 mg (12/01/2018), 700 mg (12/15/2018), 700 mg (12/29/2018), 700 mg (01/12/2019), 700 mg (01/26/2019), 700 mg (02/09/2019), 700 mg (05/11/2019), 700 mg (05/25/2019), 700 mg (06/08/2019), 700 mg (06/22/2019) fluorouracil (ADRUCIL) chemo injection 850 mg, 400 mg/m2 = 850 mg, Intravenous,  Once, 12 of 14 cycles Dose modification: 320 mg/m2 (original dose 400 mg/m2, Cycle 2, Reason: Provider Judgment, Comment: 20%  dose reduction) Administration: 850 mg (10/04/2018), 700 mg (10/25/2018), 700 mg (12/01/2018), 700 mg (12/15/2018), 700 mg (12/29/2018), 700 mg (01/12/2019), 700 mg (01/26/2019), 700 mg (02/09/2019), 700 mg (05/11/2019), 700 mg (05/25/2019), 700 mg (06/08/2019), 700 mg (06/22/2019) fosaprepitant (EMEND) 150 mg,  dexamethasone (DECADRON) 12 mg in sodium chloride 0.9 % 145 mL IVPB, , Intravenous,  Once, 10 of 12 cycles Administration:  (12/01/2018),  (12/15/2018),  (12/29/2018),  (01/12/2019),  (01/26/2019),  (02/09/2019),  (05/11/2019),  (05/25/2019),  (06/08/2019),  (06/22/2019) fluorouracil (ADRUCIL) 5,000 mg in sodium chloride 0.9 % 150 mL chemo infusion, 2,325 mg/m2 = 5,150 mg, Intravenous, 1 Day/Dose, 12 of 14 cycles Dose modification: 1,920 mg/m2 (original dose 2,400 mg/m2, Cycle 2, Reason: Provider Judgment, Comment: 20% dose reduction) Administration: 5,000 mg (10/04/2018), 4,100 mg (10/25/2018), 4,100 mg (12/01/2018), 4,100 mg (12/15/2018), 4,100 mg (12/29/2018), 4,100 mg (01/12/2019), 4,100 mg (01/26/2019), 4,100 mg (02/09/2019), 4,100 mg (05/11/2019), 4,100 mg (05/25/2019), 4,100 mg (06/08/2019), 4,100 mg (06/22/2019) bevacizumab-bvzr (ZIRABEV) 400 mg in sodium chloride 0.9 % 100 mL chemo infusion, 5 mg/kg = 400 mg (100 % of original dose 5 mg/kg), Intravenous,  Once, 4 of 6 cycles Dose modification: 5 mg/kg (original dose 5 mg/kg, Cycle 6) Administration: 400 mg (01/12/2019), 400 mg (01/26/2019), 400 mg (02/09/2019)  for chemotherapy treatment.       CANCER STAGING: Cancer Staging Rectal cancer ([place]) Staging form: Colon and Rectum, AJCC 7th Edition - Clinical: Stage IIIB (T3, N1, M0) - Signed by [name], MD on 02/04/2011 - Pathologic stage from 11/17/2014: Stage IVA (M1a) - Signed by Baird Cancer, PA-C on 09/14/2015    INTERVAL HISTORY:  Charles [name] 69 y.o. male seen for follow-up of metastatic rectal cancer, toxicity assessment prior to cycle 4 of chemotherapy.  Cycle 3 was on 06/08/2019.  He did not experience any nausea vomiting diarrhea or constipation.  Right lower quadrant abdominal pain and right posterior rib pain is well controlled with oxycodone 3 times a day.  Numbness and neuropathic pain is also well controlled with gabapentin.  Appetite is 100%.  Energy levels are 50%.  REVIEW OF SYSTEMS:    Review of Systems  Gastrointestinal: Positive for abdominal pain.  Neurological: Positive for dizziness and numbness.  All other systems reviewed and are negative.    PAST MEDICAL/SURGICAL HISTORY:  Past Medical History:  Diagnosis Date  . Chronic anticoagulation   . Colon cancer ([place]) 07/24/2010   rectal ca, inv adenocarcinoma  . Depression 04/01/2011  . Diabetes mellitus without complication ([place])   . DVT (deep venous thrombosis) ([place]) 05/09/2011  . GERD (gastroesophageal reflux disease)   . High output ileostomy ([place]) 05/21/2011  . History of kidney stones   . HTN (hypertension)   . Hx of radiation therapy 09/02/10 to 10/14/10   pelvis  . Lung metastasis ([place])   . Neuropathy   . Peripheral vascular disease ([place])    dvt's,pe  . Pneumonia    hx x3  . Pulmonary embolism ([place])   . Rectal cancer ([place]) 01/15/2011   S/P radiation and concurrent 5-FU continuous infusion from 09/09/10- 10/10/10.  S/P proctectomy with colorectal anastomosis and diverting loop ileostomy on 11/14/10 at [facility] by Dr. Harlon Ditty. Pathology reveals a pT3b N1 with 3/20 lymph nodes.     Past Surgical History:  Procedure Laterality Date  . BILIARY STENT PLACEMENT N/A 09/16/2018   Procedure: STENT PLACEMENT;  Surgeon: Rogene Houston, MD;  [facility];  Service: Endoscopy;  Laterality: N/A;  . BIOPSY  09/24/2018   Procedure: BIOPSY;  Surgeon: Danie Binder, MD;  [facility];  Service: Endoscopy;;  . COLON SURGERY  11/14/2010   proctectomy with colorectal anastomosis and diverting loop ileostomy (temporary planned)  . COLONOSCOPY  07/2010   proximal rectal apple core mass 10-14cm from anal verge (adenocarcinoma), 2-3cm distal rectal carpet polyp s/p piecemeal snare polypectomy (adenoma)  . COLONOSCOPY  04/21/2012   RMR: Friable,fibrotic appearing colorectal anastomosis producing some luminal narrowing-not felt to be critical. path: focal erosion with slight inflammation and hyperemia.  SURVEILLANCE DUE DEC 2015  . COLONOSCOPY N/A 11/24/2013   Dr. Rourk:somewhat fibrotic/friable anastomotic mucosal-status post biopsy (narrowing not felt to be clinically significant) Single colonic diverticulum. benign polypoid rectal mucosa  . colostomy reversal  april 2013  . ERCP N/A 09/16/2018   Procedure: ENDOSCOPIC RETROGRADE CHOLANGIOPANCREATOGRAPHY (ERCP);  Surgeon: Rogene Houston, MD;  [facility];  Service: Endoscopy;  Laterality: N/A;  . ESOPHAGOGASTRODUODENOSCOPY  07/2010   RMR: schatki ring s/p dilation, small hh, SB bx benign  . ESOPHAGOGASTRODUODENOSCOPY N/A 09/04/2015   Procedure: ESOPHAGOGASTRODUODENOSCOPY (EGD);  Surgeon: Daneil Dolin, MD;  [facility];  Service: Endoscopy;  Laterality: N/A;  730  . EUS  08/2010   Dr. Owens Loffler. uT3N0 circumferential, nearly obstruction rectosigmoid adenocarcinoma, distal edge 12cm from anal verge  . EUS N/A 06/19/2016   Procedure: UPPER ENDOSCOPIC ULTRASOUND (EUS) RADIAL;  Surgeon: Milus Banister, MD;  [facility];  Service: Endoscopy;  Laterality: N/A;  . FLEXIBLE SIGMOIDOSCOPY N/A 09/24/2018   Procedure: FLEXIBLE SIGMOIDOSCOPY;  Surgeon: Danie Binder, MD;  [facility];  Service: Endoscopy;  Laterality: N/A;  . HERNIA REPAIR     abd hernia repair  . IVC filter    . ivc filter    . port a cath placement    . PORT-A-CATH REMOVAL  09/24/2011   Procedure: REMOVAL PORT-A-CATH;  Surgeon: Donato Heinz, MD;  Location: [facility];  Service: General;  Laterality: N/A;  Minor Room  . PORTACATH PLACEMENT Right 07/07/2016   Procedure: INSERTION PORT-A-CATH;  Surgeon: Aviva Signs, MD;  Location: [facility];  Service: General;  Laterality: Right;  . SPHINCTEROTOMY N/A 09/16/2018   Procedure: SPHINCTEROTOMY with balloon dialation;  Surgeon: Rogene Houston, MD;  [facility];  Service: Endoscopy;  Laterality: N/A;  . TRANSVERSE LOOP COLOSTOMY N/A 09/29/2018   Procedure: TRANSVERSE LOOP COLOSTOMY;   Surgeon: Aviva Signs, MD;  Location: [facility];  Service: General;  Laterality: N/A;  . VIDEO ASSISTED THORACOSCOPY (VATS)/WEDGE RESECTION Right 11/15/2014   Procedure: VIDEO ASSISTED THORACOSCOPY (VATS)/LUNG RESECTION WITH RIGHT LINGULECTOMY;  Surgeon: [name], MD;  Location: [place];  Service: Thoracic;  Laterality: Right;  Marland Kitchen VIDEO BRONCHOSCOPY N/A 11/15/2014   Procedure: VIDEO BRONCHOSCOPY;  Surgeon: [name], MD;  Location: [facility] OR;  Service: Thoracic;  Laterality: N/A;     SOCIAL HISTORY:  Social History   Socioeconomic History  . Marital status: Married    Spouse name: Not on file  . Number of children: 2  . Years of education: Not on file  . Highest education level: Not on file  Occupational History  . Occupation: self-employed Regulatory affairs officer, currently not working    Fish farm manager: SELF EMPLOYED  Tobacco Use  . Smoking status: Never Smoker  . Smokeless tobacco: Never Used  Substance and Sexual Activity  . Alcohol use: No  . Drug use: No  . Sexual activity: Yes    Birth control/protection:  None    Comment: married  Other Topics Concern  . Not on file  Social History Narrative  . Not on file   Social Determinants of Health   Financial Resource Strain: Low Risk   . Difficulty of Paying Living Expenses: Not very hard  Food Insecurity: No Food Insecurity  . Worried About Charity fundraiser in the Last Year: Never true  . Ran Out of Food in the Last Year: Never true  Transportation Needs: No Transportation Needs  . Lack of Transportation (Medical): No  . Lack of Transportation (Non-Medical): No  Physical Activity: Inactive  . Days of Exercise per Week: 0 days  . Minutes of Exercise per Session: 0 min  Stress: No Stress Concern Present  . Feeling of Stress : Only a little  Social Connections: Unknown  . Frequency of Communication with Friends and Family: More than three times a week  . Frequency of Social Gatherings with Friends and Family:  Once a week  . Attends Religious Services: Patient refused  . Active Member of Clubs or Organizations: Patient refused  . Attends Archivist Meetings: Patient refused  . Marital Status: Patient refused  Intimate Partner Violence: Unknown  . Fear of Current or Ex-Partner: Patient refused  . Emotionally Abused: Patient refused  . Physically Abused: Patient refused  . Sexually Abused: Patient refused    FAMILY HISTORY:  Family History  Problem Relation Age of Onset  . Cancer Brother        throat  . Cancer Brother        prostate  . Colon cancer Neg Hx   . Liver disease Neg Hx   . Inflammatory bowel disease Neg Hx     CURRENT MEDICATIONS:  Outpatient Encounter Medications as of 06/22/2019  Medication Sig  . clonazePAM (KLONOPIN) 1 MG tablet Take 1 tablet (1 mg total) by mouth 3 (three) times daily as needed.  . dronabinol (MARINOL) 5 MG capsule Take 1 capsule (5 mg total) by mouth 2 (two) times daily before a meal.  . gabapentin (NEURONTIN) 300 MG capsule Take 3 capsules (900 mg total) by mouth 2 (two) times daily.  Marland Kitchen glimepiride (AMARYL) 2 MG tablet Take 2 mg by mouth 2 (two) times daily before a meal.   . magnesium oxide (MAG-OX) 400 (241.3 Mg) MG tablet Take 1 tablet (400 mg total) by mouth 2 (two) times daily.  . mirtazapine (REMERON) 15 MG tablet Take 7.5 mg by mouth at bedtime.  Marland Kitchen oxyCODONE-acetaminophen (PERCOCET) 10-325 MG tablet Take 1 tablet by mouth every 8 (eight) hours as needed for pain.  . pantoprazole (PROTONIX) 40 MG tablet Take 40 mg by mouth every evening.   . Probiotic Product (PROBIOTIC DAILY PO) Take 1 capsule by mouth 2 (two) times daily.   . rivaroxaban (XARELTO) 20 MG TABS tablet Take 1 tablet (20 mg total) by mouth daily with supper.  . vitamin B-12 (CYANOCOBALAMIN) 1000 MCG tablet Take 1,000 mcg by mouth daily.  Marland Kitchen acetaminophen (TYLENOL) 500 MG tablet Take 1,000 mg by mouth every 6 (six) hours as needed for mild pain or moderate pain.   .  Diphenhyd-Hydrocort-Nystatin (FIRST-DUKES MOUTHWASH) SUSP Use as directed 5 mLs in the mouth or throat 4 (four) times daily. (Patient not taking: Reported on 06/22/2019)  . ondansetron (ZOFRAN) 4 MG tablet Take 1 tablet (4 mg total) by mouth daily as needed for nausea or vomiting. (Patient not taking: Reported on 05/25/2019)   Facility-Administered Encounter Medications as of 06/22/2019  Medication  . sodium chloride 0.9 % injection 10 mL  . sodium chloride flush (NS) 0.9 % injection 10 mL    ALLERGIES:  Allergies  Allergen Reactions  . Oxycodone     Blisters, hallucinations  . Tramadol     Blisters, hallucinations  . Trazodone And Nefazodone Other (See Comments)    hallucinations     PHYSICAL EXAM:  ECOG Performance status: 1  Vitals:   06/22/19 0853  BP: 125/82  Pulse: 92  Resp: 18  Temp: (!) 97.1 F (36.2 C)  SpO2: 100%   Filed Weights   06/22/19 0853  Weight: 164 lb 9.6 oz (74.7 kg)    Physical Exam Constitutional:      Appearance: Normal appearance.  HENT:     Head: Normocephalic.     Nose: Nose normal.     Mouth/Throat:     Mouth: Mucous membranes are moist.     Pharynx: Oropharynx is clear.  Eyes:     Extraocular Movements: Extraocular movements intact.     Conjunctiva/sclera: Conjunctivae normal.  Cardiovascular:     Rate and Rhythm: Regular rhythm. Tachycardia present.     Pulses: Normal pulses.     Heart sounds: Normal heart sounds.  Pulmonary:     Effort: Pulmonary effort is normal.     Breath sounds: Normal breath sounds.  Abdominal:     General: Abdomen is flat. Bowel sounds are normal.     Palpations: Abdomen is soft.  Musculoskeletal:        General: Normal range of motion.     Cervical back: Normal range of motion.  Skin:    General: Skin is warm and dry.  Neurological:     General: No focal deficit present.     Mental Status: He is alert and oriented to person, place, and time.  Psychiatric:        Mood and Affect: Mood normal.         Behavior: Behavior normal.        Thought Content: Thought content normal.        Judgment: Judgment normal.      LABORATORY DATA:  I have reviewed the labs as listed.  CBC    Component Value Date/Time   WBC 11.4 (H) 06/22/2019 0910   RBC 4.23 06/22/2019 0910   HGB 11.5 (L) 06/22/2019 0910   HGB 11.6 (L) 02/04/2011 1059   HCT 38.1 (L) 06/22/2019 0910   HCT 34.7 (L) 02/04/2011 1059   PLT 234 06/22/2019 0910   PLT 282 02/04/2011 1059   MCV 90.1 06/22/2019 0910   MCV 76.0 (L) 02/04/2011 1059   MCH 27.2 06/22/2019 0910   MCHC 30.2 06/22/2019 0910   RDW 16.0 (H) 06/22/2019 0910   RDW 18.5 (H) 02/04/2011 1059   LYMPHSABS 2.0 06/22/2019 0910   LYMPHSABS 1.1 02/04/2011 1059   MONOABS 0.8 06/22/2019 0910   MONOABS 0.5 02/04/2011 1059   EOSABS 0.2 06/22/2019 0910   EOSABS 0.1 02/04/2011 1059   BASOSABS 0.1 06/22/2019 0910   BASOSABS 0.1 02/04/2011 1059   CMP Latest Ref Rng & Units 06/22/2019 06/08/2019 05/25/2019  Glucose 70 - 99 mg/dL 223(H) 275(H) 235(H)  BUN 8 - 23 mg/dL _0 Creatinine 0.61 - 1.24 mg/dL 0.75 0.73 0.70  Sodium 135 - 145 mmol/L 137 137 136  Potassium 3.5 - 5.1 mmol/L 4.3 4.1 4.1  Chloride 98 - 111 mmol/L 104 105 101  CO2 22 - 32 mmol/L _1 Calcium  8.9 - 10.3 mg/dL 8.3(L) 8.2(L) 8.3(L)  Total Protein 6.5 - 8.1 g/dL 5.4(L) 5.1(L) 5.6(L)  Total Bilirubin 0.3 - 1.2 mg/dL 0.3 0.5 0.4  Alkaline Phos 38 - 126 U/L 189(H) 200(H) 149(H)  AST 15 - 41 U/L _0 ALT 0 - 44 U/L 18 44 21    I have personally reviewed the scans and discussed with the patient.   ASSESSMENT & PLAN:   Rectal cancer ([place]) 1.  Stage IV rectal cancer with lung and pancreatic metastasis: -Foundation 1 with K-ras G 13 D, NRAS wild-type, MSI stable. -3 cycles of FOLFIRI dose reduced from 05/11/2019 through 06/01/2019. -He tolerated last cycle reasonably well. -I have reviewed his labs including CBC and LFTs which are normal.  Creatinine was also normal at 0.75. -We will proceed  with cycle 4 today.  I will continue to hold bevacizumab given his history of perforated appendicitis. -He is continuing to have problems with his ostomy site particularly when he lies down.  He will follow with Dr. Arnoldo Morale.  2.  Weight loss: -He lost about 1 pound.  He is not taking Marinol. -He has been eating very well and drinking about 2 cans of boost per day with 30 g of protein in it.  3.  Right posterior rib pain: -He also has abdominal pain in the right lower quadrant.  He is taking oxycodone 10 mg 3 times a day which is helping.  4.  Anxiety: -He will continue Klonopin 1 mg twice daily.  5.  Neuropathy: -He will continue gabapentin 3 times a day.  6.  Hypomagnesemia: -He will continue magnesium twice daily.  7.  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  He will continue Xarelto.  No bleeding issues.    Orders placed this encounter:  Orders Placed This Encounter  Procedures  . CBC with Differential/Platelet  . Comprehensive metabolic panel  . Magnesium  . CEA      Derek Jack, MD  [place] [phone]

## 2019-06-22 NOTE — Patient Instructions (Signed)
[facility] Discharge Instructions for Patients Receiving Chemotherapy  Today you received the following chemotherapy agents   To help prevent nausea and vomiting after your treatment, we encourage you to take your nausea medication   If you develop nausea and vomiting that is not controlled by your nausea medication, call the clinic.   BELOW ARE SYMPTOMS THAT SHOULD BE REPORTED IMMEDIATELY:  *FEVER GREATER THAN 100.5 F  *CHILLS WITH OR WITHOUT FEVER  NAUSEA AND VOMITING THAT IS NOT CONTROLLED WITH YOUR NAUSEA MEDICATION  *UNUSUAL SHORTNESS OF BREATH  *UNUSUAL BRUISING OR BLEEDING  TENDERNESS IN MOUTH AND THROAT WITH OR WITHOUT PRESENCE OF ULCERS  *URINARY PROBLEMS  *BOWEL PROBLEMS  UNUSUAL RASH Items with * indicate a potential emergency and should be followed up as soon as possible.  Feel free to call the clinic should you have any questions or concerns. The clinic phone number is [phone].  Please show the CHEMO ALERT CARD at check-in to the Emergency Department and triage nurse.   

## 2019-06-22 NOTE — Patient Instructions (Signed)
[facility] at [facility] Discharge Instructions  You were seen today by Dr. Katragadda. He went over your recent lab results. He will see you back in 2 weeks for labs, treatment and follow up.   Thank you for choosing [facility] at [facility] to provide your oncology and hematology care.  To afford each patient quality time with our provider, please arrive at least 15 minutes before your scheduled appointment time.   If you have a lab appointment with the Cancer Center please come in thru the  Main Entrance and check in at the main information desk  You need to re-schedule your appointment should you arrive 10 or more minutes late.  We strive to give you quality time with our providers, and arriving late affects you and other patients whose appointments are after yours.  Also, if you no show three or more times for appointments you may be dismissed from the clinic at the providers discretion.     Again, thank you for choosing [facility].  Our hope is that these requests will decrease the amount of time that you wait before being seen by our physicians.       _____________________________________________________________  Should you have questions after your visit to [facility], please contact our office at [phone] between the hours of 8:00 a.m. and 4:30 p.m.  Voicemails left after 4:00 p.m. will not be returned until the following business day.  For prescription refill requests, have your pharmacy contact our office and allow 72 hours.    Cancer Center Support Programs:   > Cancer Support Group  2nd Tuesday of the month 1pm-2pm, Journey Room    

## 2019-06-22 NOTE — Progress Notes (Signed)
Patient has been assessed, vital signs and labs have been reviewed by Dr. Katragadda. ANC, Creatinine, LFTs, and Platelets are within treatment parameters per Dr. Katragadda. The patient is good to proceed with treatment at this time.  

## 2019-06-22 NOTE — Progress Notes (Signed)
Labs reviewed with MD today. Proceed with treatment as planned.   Treatment given per orders. Patient tolerated it well without problems. Vitals stable and discharged home from clinic ambulatory. Follow up as scheduled.

## 2019-06-24 ENCOUNTER — Encounter (HOSPITAL_COMMUNITY): Payer: Self-pay

## 2019-06-24 ENCOUNTER — Other Ambulatory Visit: Payer: Self-pay

## 2019-06-24 ENCOUNTER — Inpatient Hospital Stay (HOSPITAL_COMMUNITY): Payer: Medicare Other

## 2019-06-24 VITALS — BP 119/64 | HR 68 | Temp 97.3°F | Resp 18

## 2019-06-24 DIAGNOSIS — Z5189 Encounter for other specified aftercare: Secondary | ICD-10-CM | POA: Diagnosis not present

## 2019-06-24 DIAGNOSIS — Z5111 Encounter for antineoplastic chemotherapy: Secondary | ICD-10-CM | POA: Diagnosis not present

## 2019-06-24 DIAGNOSIS — C20 Malignant neoplasm of rectum: Secondary | ICD-10-CM

## 2019-06-24 DIAGNOSIS — C189 Malignant neoplasm of colon, unspecified: Secondary | ICD-10-CM | POA: Diagnosis not present

## 2019-06-24 DIAGNOSIS — C7800 Secondary malignant neoplasm of unspecified lung: Secondary | ICD-10-CM | POA: Diagnosis not present

## 2019-06-24 DIAGNOSIS — C7889 Secondary malignant neoplasm of other digestive organs: Secondary | ICD-10-CM | POA: Diagnosis not present

## 2019-06-24 MED ORDER — PEGFILGRASTIM-CBQV 6 MG/0.6ML ~~LOC~~ SOSY
6.0000 mg | PREFILLED_SYRINGE | Freq: Once | SUBCUTANEOUS | Status: AC
  Administered 2019-06-24: 6 mg via SUBCUTANEOUS

## 2019-06-24 MED ORDER — HEPARIN SOD (PORK) LOCK FLUSH 100 UNIT/ML IV SOLN
500.0000 [IU] | Freq: Once | INTRAVENOUS | Status: AC | PRN
  Administered 2019-06-24: 12:00:00 500 [IU]

## 2019-06-24 MED ORDER — SODIUM CHLORIDE 0.9% FLUSH
10.0000 mL | INTRAVENOUS | Status: DC | PRN
  Administered 2019-06-24: 10 mL

## 2019-06-24 MED ORDER — PEGFILGRASTIM-CBQV 6 MG/0.6ML ~~LOC~~ SOSY
PREFILLED_SYRINGE | SUBCUTANEOUS | Status: AC
  Filled 2019-06-24: qty 0.6

## 2019-06-24 NOTE — Progress Notes (Signed)
Chemotherapy pump disconnected.  Patients port flushed without difficulty.  Good blood return noted with no bruising or swelling noted at site.  Band aid applied.  VSS with discharge and left ambulatory with no s/s of distress noted.

## 2019-06-27 ENCOUNTER — Other Ambulatory Visit (HOSPITAL_COMMUNITY): Payer: Self-pay | Admitting: Nurse Practitioner

## 2019-06-27 DIAGNOSIS — F419 Anxiety disorder, unspecified: Secondary | ICD-10-CM

## 2019-06-27 DIAGNOSIS — C20 Malignant neoplasm of rectum: Secondary | ICD-10-CM

## 2019-06-28 ENCOUNTER — Telehealth: Payer: Self-pay

## 2019-06-28 NOTE — Telephone Encounter (Signed)
Patient added to schedule 06/30/19.

## 2019-06-28 NOTE — Telephone Encounter (Signed)
Patient states her husbands stoma sticks out and when he lies down the stoma sinks down and causes the bag to leak. She stated Dr.jenkins stated he may need to add sutures to keep the stoma in place.

## 2019-06-30 ENCOUNTER — Other Ambulatory Visit: Payer: Self-pay

## 2019-06-30 ENCOUNTER — Encounter: Payer: Self-pay | Admitting: General Surgery

## 2019-06-30 ENCOUNTER — Ambulatory Visit (INDEPENDENT_AMBULATORY_CARE_PROVIDER_SITE_OTHER): Payer: Medicare Other | Admitting: General Surgery

## 2019-06-30 VITALS — BP 125/71 | HR 88 | Temp 97.9°F | Resp 12 | Ht 74.0 in | Wt 164.2 lb

## 2019-06-30 DIAGNOSIS — K9400 Colostomy complication, unspecified: Secondary | ICD-10-CM | POA: Diagnosis not present

## 2019-06-30 NOTE — Progress Notes (Signed)
Subjective:     Charles [name]  Presents complaining of retraction of the skin around the colostomy when he lies down at night, causing leakage at the colostomy bag.  She states she recently changed the paste that was placed around the skin before applying the wafer.  She used to use a gel.  They are just frustrated that they have to purchase multiple wafers outside the 90-day Medicare approved usage.  Is still undergoing chemotherapy. Objective:    BP 125/71   Pulse 88   Temp 97.9 F (36.6 C) (Oral)   Resp 12   Ht 6\' 2"  (1.88 m)   Wt 164 lb 3.2 oz (74.5 kg)   SpO2 97%   BMI 21.08 kg/m   General:  alert, cooperative and no distress  Abdomen soft, loop colostomy present in left upper quadrant.  No significant skin irritation is noted around the colostomy.  It appears appropriately well-healed.  I was shown pictures of when it retracts, and it looks like it retracts back to the skin level.     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    Plan:   I suggested that the patient should apply the paste and wait for at night when he is lying down.  This may help with any leakage.  They will try that for 1 week.  Should this not work, I told him I would refer them to the enterostomal therapy nurse for further recommendations.  This is not amenable to surgical intervention at this time.

## 2019-07-06 ENCOUNTER — Encounter (HOSPITAL_COMMUNITY): Payer: Medicare Other

## 2019-07-11 ENCOUNTER — Encounter (HOSPITAL_COMMUNITY): Payer: Self-pay | Admitting: Hematology

## 2019-07-11 ENCOUNTER — Inpatient Hospital Stay (HOSPITAL_BASED_OUTPATIENT_CLINIC_OR_DEPARTMENT_OTHER): Payer: Medicare Other | Admitting: Hematology

## 2019-07-11 ENCOUNTER — Inpatient Hospital Stay (HOSPITAL_COMMUNITY): Payer: Medicare Other

## 2019-07-11 ENCOUNTER — Other Ambulatory Visit (HOSPITAL_COMMUNITY): Payer: Self-pay | Admitting: Nurse Practitioner

## 2019-07-11 ENCOUNTER — Other Ambulatory Visit: Payer: Self-pay

## 2019-07-11 ENCOUNTER — Inpatient Hospital Stay (HOSPITAL_COMMUNITY): Payer: Medicare Other | Attending: Hematology

## 2019-07-11 VITALS — BP 123/70 | HR 75 | Resp 18

## 2019-07-11 DIAGNOSIS — C20 Malignant neoplasm of rectum: Secondary | ICD-10-CM | POA: Diagnosis not present

## 2019-07-11 DIAGNOSIS — E860 Dehydration: Secondary | ICD-10-CM

## 2019-07-11 DIAGNOSIS — C7800 Secondary malignant neoplasm of unspecified lung: Secondary | ICD-10-CM | POA: Insufficient documentation

## 2019-07-11 DIAGNOSIS — C7889 Secondary malignant neoplasm of other digestive organs: Secondary | ICD-10-CM | POA: Diagnosis not present

## 2019-07-11 DIAGNOSIS — Z5111 Encounter for antineoplastic chemotherapy: Secondary | ICD-10-CM | POA: Diagnosis not present

## 2019-07-11 DIAGNOSIS — Z5189 Encounter for other specified aftercare: Secondary | ICD-10-CM | POA: Insufficient documentation

## 2019-07-11 DIAGNOSIS — R112 Nausea with vomiting, unspecified: Secondary | ICD-10-CM

## 2019-07-11 DIAGNOSIS — Z79899 Other long term (current) drug therapy: Secondary | ICD-10-CM | POA: Insufficient documentation

## 2019-07-11 LAB — CBC WITH DIFFERENTIAL/PLATELET
Abs Immature Granulocytes: 0.07 10*3/uL (ref 0.00–0.07)
Basophils Absolute: 0.1 10*3/uL (ref 0.0–0.1)
Basophils Relative: 1 %
Eosinophils Absolute: 0.2 10*3/uL (ref 0.0–0.5)
Eosinophils Relative: 3 %
HCT: 38.3 % — ABNORMAL LOW (ref 39.0–52.0)
Hemoglobin: 12 g/dL — ABNORMAL LOW (ref 13.0–17.0)
Immature Granulocytes: 1 %
Lymphocytes Relative: 18 %
Lymphs Abs: 1.7 10*3/uL (ref 0.7–4.0)
MCH: 28 pg (ref 26.0–34.0)
MCHC: 31.3 g/dL (ref 30.0–36.0)
MCV: 89.3 fL (ref 80.0–100.0)
Monocytes Absolute: 0.8 10*3/uL (ref 0.1–1.0)
Monocytes Relative: 8 %
Neutro Abs: 6.6 10*3/uL (ref 1.7–7.7)
Neutrophils Relative %: 69 %
Platelets: 317 10*3/uL (ref 150–400)
RBC: 4.29 MIL/uL (ref 4.22–5.81)
RDW: 16.1 % — ABNORMAL HIGH (ref 11.5–15.5)
WBC: 9.4 10*3/uL (ref 4.0–10.5)
nRBC: 0 % (ref 0.0–0.2)

## 2019-07-11 LAB — COMPREHENSIVE METABOLIC PANEL
ALT: 188 U/L — ABNORMAL HIGH (ref 0–44)
AST: 242 U/L — ABNORMAL HIGH (ref 15–41)
Albumin: 2.9 g/dL — ABNORMAL LOW (ref 3.5–5.0)
Alkaline Phosphatase: 391 U/L — ABNORMAL HIGH (ref 38–126)
Anion gap: 7 (ref 5–15)
BUN: 8 mg/dL (ref 8–23)
CO2: 26 mmol/L (ref 22–32)
Calcium: 8.3 mg/dL — ABNORMAL LOW (ref 8.9–10.3)
Chloride: 104 mmol/L (ref 98–111)
Creatinine, Ser: 0.86 mg/dL (ref 0.61–1.24)
GFR calc Af Amer: >60 mL/min (ref >60)
GFR calc non Af Amer: >60 mL/min (ref >60)
Glucose, Bld: 290 mg/dL — ABNORMAL HIGH (ref 70–99)
Potassium: 4.2 mmol/L (ref 3.5–5.1)
Sodium: 137 mmol/L (ref 135–145)
Total Bilirubin: 1 mg/dL (ref 0.3–1.2)
Total Protein: 5.4 g/dL — ABNORMAL LOW (ref 6.5–8.1)

## 2019-07-11 LAB — MAGNESIUM: Magnesium: 1.6 mg/dL — ABNORMAL LOW (ref 1.7–2.4)

## 2019-07-11 MED ORDER — SODIUM CHLORIDE 0.9% FLUSH: 10.0000 mL | Freq: Once | INTRAVENOUS | Status: DC | PRN

## 2019-07-11 MED ORDER — HEPARIN SOD (PORK) LOCK FLUSH 100 UNIT/ML IV SOLN
500.0000 [IU] | Freq: Once | INTRAVENOUS | Status: AC | PRN
  Administered 2019-07-11: 500 [IU]

## 2019-07-11 MED ORDER — SODIUM CHLORIDE 0.9 % IV SOLN
Freq: Once | INTRAVENOUS | Status: AC
  Filled 2019-07-11: qty 1000

## 2019-07-11 MED ORDER — OXYCODONE-ACETAMINOPHEN 10-325 MG PO TABS: 1.0000 | ORAL_TABLET | Freq: Three times a day (TID) | ORAL | 0 refills | Status: DC | PRN

## 2019-07-11 NOTE — Progress Notes (Signed)
Labs reviewed today. Will hold treatment this week and give hydration fluids with electrolytes per MD.    Patient tolerated it well without problems. Vitals stable and discharged home from clinic ambulatory. Follow up as scheduled.

## 2019-07-11 NOTE — Progress Notes (Signed)
Patient has been assessed, vital signs and labs have been reviewed by Dr. [name]. Please give 1 liter of fluids with electrolytes over 2 hours today. HOLD Tx , he will return in 1 week for labs and Tx.

## 2019-07-11 NOTE — Patient Instructions (Addendum)
[place] at [facility] Discharge Instructions  You were seen today by Dr. [name]. He went over your recent lab results. He will hold your treatment today due to your lab results. He will see you back in 1 week for labs, treatment and follow up.   Thank you for choosing [facility] at [facility] to provide your oncology and hematology care.  To afford each patient quality time with our provider, please arrive at least 15 minutes before your scheduled appointment time.   If you have a lab appointment with the [facility] please come in thru the  Main Entrance and check in at the main information desk  You need to re-schedule your appointment should you arrive 10 or more minutes late.  We strive to give you quality time with our providers, and arriving late affects you and other patients whose appointments are after yours.  Also, if you no show three or more times for appointments you may be dismissed from the clinic at the providers discretion.     Again, thank you for choosing [facility].  Our hope is that these requests will decrease the amount of time that you wait before being seen by our physicians.       _____________________________________________________________  Should you have questions after your visit to [facility], please contact our office at [phone] between the hours of 8:00 a.m. and 4:30 p.m.  Voicemails left after 4:00 p.m. will not be returned until the following business day.  For prescription refill requests, have your pharmacy contact our office and allow 72 hours.    Cancer Center Support Programs:   > Cancer Support Group  2nd Tuesday of the month 1pm-2pm, Journey Room

## 2019-07-11 NOTE — Patient Instructions (Signed)
[facility] at [facility]  Discharge Instructions:   _______________________________________________________________  Thank you for choosing [facility] at [facility] to provide your oncology and hematology care.  To afford each patient quality time with our providers, please arrive at least 15 minutes before your scheduled appointment.  You need to re-schedule your appointment if you arrive 10 or more minutes late.  We strive to give you quality time with our providers, and arriving late affects you and other patients whose appointments are after yours.  Also, if you no show three or more times for appointments you may be dismissed from the clinic.  Again, thank you for choosing [facility] at [facility]. Our hope is that these requests will allow you access to exceptional care and in a timely manner. _______________________________________________________________  If you have questions after your visit, please contact our office at [phone] between the hours of 8:30 a.m. and 5:00 p.m. Voicemails left after 4:30 p.m. will not be returned until the following business day. _______________________________________________________________  For prescription refill requests, have your pharmacy contact our office. _______________________________________________________________  Recommendations made by the consultant and any test results will be sent to your referring physician. _______________________________________________________________ 

## 2019-07-11 NOTE — Assessment & Plan Note (Addendum)
1.  Stage IV rectal cancer with lung and pancreatic metastasis: -Foundation 1 with K-ras G13D, NRAS wild-type, MSI stable. -4 cycles of FOLFIRI dose reduced from 05/11/2019 through 06/16/2019. -He felt very weak for 1 week after last treatment.  He had diarrhea for 2 days after the pump was discontinued.  He also reported body aches which lasted 4 days after G-CSF injection. -We reviewed his labs.  LFTs are elevated with AST elevated more than 6 times upper limit of normal. -I have recommended holding his treatment today.  We will reevaluate him in 1 week with repeat labs and possible treatment.  2.  Right lower quadrant pain: -He has abdominal pain in the right lower quadrant.  He also has right lateral abdominal wall pain which goes to the back. -This is controlled with oxycodone 10 mg 3 times a day.  We will refill his pain medication.  3.  Anxiety: -He will continue Klonopin 1 mg twice daily which is helping.  4.  Weight loss: -He is drinking about 2 cans of boost per day with 30 g of protein in it.  5.  Neuropathy: -She will continue gabapentin 3 times a day.  6.  Hypomagnesemia: -He is taking magnesium twice daily.  His magnesium today is 1.6.  He will receive 2 g of IV magnesium.  7.  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  He will continue Xarelto.  He is not having any bleeding issues.

## 2019-07-11 NOTE — Progress Notes (Signed)
Charles [name], [place] [id number]   CLINIC:  Medical Oncology/Hematology  PCP:  [name], MD [place] [address] [phone]   REASON FOR VISIT:  Follow-up for Stage IV Colon Cancer  CURRENT THERAPY: Dose reduced FOLFIRI.  BRIEF ONCOLOGIC HISTORY:  Oncology History  Rectal cancer ([place])  07/24/2010 Initial Diagnosis   Invasive adenocarcinoma of rectum   09/09/2010 Concurrent Chemotherapy   S/P radiation and concurrent 5-FU continuous infusion from 09/09/10- 10/10/10.   11/14/2010 Surgery   S/P proctectomy with colorectal anastomosis and diverting loop ileostomy on 11/14/10 at [facility] by Dr. Harlon Ditty. Pathology reveals a pT3b N1 with 3/20 lymph nodes.   02/05/2011 - 07/14/2011 Chemotherapy   FOLFOX   08/18/2011 Surgery   Approximate date of surgery- [place] by Dr. Harlon Ditty    Remission     11/24/2013 Survivorship   Colonoscopy- somewhat fibrotic/friable anastomotic mucosal-status post biopsy (narrowing not felt to be clinically significant).  Negative pathology for malignancy   09/12/2014 Imaging   DVT in the left femoral venous system, left common iliac vein, IVC, and within the IVC filter Right lower extremity venography confirms chronic occlusion of the femoral venous system with collateralization. The right iliac venous system is patent and do   09/25/2014 PET scan   The right middle lobe pulmonary nodule is hypermetabolic, favored to represent a primary bronchogenic carcinoma.Equivocal mediastinal nodes, similar to surrounding blood pool. Bilateral adrenal hypermetabolism, felt to be physiologic   10/24/2014 Pathology Results   Lung, needle/core biopsy(ies), RML - ADENOCARCINOMA, SEE COMMENT metastatic adenocarcinoma of a colorectal primary   10/24/2014 Relapse/Recurrence     11/16/2014 Definitive Surgery   Bronchoscopy, right video-assisted thoracoscopy, wedge resection of right middle lobe by Dr. Servando Snare     11/16/2014 Pathology Results   Lung, wedge biopsy/resection, right lingula and small portion of middle lobe - METASTATIC ADENOCARCINOMA, CONSISTENT WITH COLORECTAL PRIMARY, SPANNING 2.0 CM. - THE SURGICAL RESECTION MARGINS ARE NEGATIVE FOR ADENOCARCINOMA.   11/16/2014 Remission   THE SURGICAL RESECTION MARGINS ARE NEGATIVE FOR ADENOCARCINOMA.   03/07/2015 Imaging   CT CAP- Interval resection of right middle lobe metastasis. No acute process or evidence of metastatic disease in the chest, abdomen or pelvis. Improved right upper lobe reticular nodular opacities are favored to represent resolving infection.   09/14/2015 Imaging   CT CAP- No findings of recurrent malignancy. No recurrence along the wedge resection site of the right middle lobe. Right anterior abdominal wall focal hernia containing a knuckle of small bowel without complicating feature.   06/13/2016 Imaging   CT CAP- 1. New right-sided pleural metastasis/mass. Other smaller right-sided pulmonary nodules which are all pleural-based and most likely represent pleural metastasis. 2. No convincing evidence of abdominopelvic nodal metastasis. 3. Constellation of findings, including pancreatic atrophy, duct dilatation, and pancreatic head soft tissue fullness which are highly suspicious for pancreatic adenocarcinoma. Metastatic disease felt much less likely. Consider endoscopic ultrasound sampling or ERCP. Cannot exclude superimposed acute pancreatitis. 4. New enlargement of the appendiceal tip with subtle surrounding edema. Cannot exclude early or mild appendicitis. 5. Coronary artery atherosclerosis. Aortic atherosclerosis. 6. 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   06/13/2016 Progression   CT scan demonstrates progression of disease   06/19/2016 Procedure   EUS with FNA by Dr. Ardis Hughs   06/20/2016 Pathology Results   FINE NEEDLE ASPIRATION, ENDOSCOPIC, PANCREAS UNCINATE AREA(SPECIMEN 1 OF 1 COLLECTED  06/19/16): MALIGNANT CELLS CONSISTENT WITH METASTATIC  ADENOCARCINOMA.   06/25/2016 PET scan   1. Two pleural-based nodules in the right hemithorax are hypermetabolic. Metastatic disease is a distinct consideration. The scattered pulmonary parenchymal nodule seen on previous diagnostic CT imaging are below the threshold for reliable resolution on PET imaging. 2. Hypermetabolic lesion pancreatic head, consistent with neoplasm. As noted on prior CT, adenocarcinoma is any consideration. No evidence for hypermetabolic abdominal lymphadenopathy. Mottled uptake noted in the liver, but no discrete hepatic metastases are evident on PET imaging. 3. Appendix remains distended up to 0.9-10 mm diameter with a stone towards the tip in subtle periappendiceal edema/inflammation. The appendix is hypermetabolic along its length. Imaging features are relatively stable in the 12 day interval since prior CT scan. While appendicitis is a consideration, the relative stability over 12 days would be unusual for that etiology. Continued close follow-up recommended.   06/26/2016 Pathology Results   Not enough tissue for foundationONE or K-ras testing.   07/07/2016 Procedure   Port placed by Dr. Arnoldo Morale   07/08/2016 -  Chemotherapy   The patient had pegfilgrastim (NEULASTA) injection 6 mg, 6 mg, Subcutaneous,  Once, 0 of 4 cycles  ondansetron (ZOFRAN) IVPB 8 mg, 8 mg, Intravenous,  Once, 0 of 4 cycles  leucovorin 804 mg in dextrose 5 % 250 mL infusion, 400 mg/m2, Intravenous,  Once, 0 of 4 cycles  oxaliplatin (ELOXATIN) 170 mg in dextrose 5 % 500 mL chemo infusion, 85 mg/m2, Intravenous,  Once, 0 of 4 cycles  fluorouracil (ADRUCIL) 4,800 mg in sodium chloride 0.9 % 150 mL chemo infusion, 2,400 mg/m2 = 4,800 mg, Intravenous, 1 Day/Dose, 0 of 4 cycles  fluorouracil (ADRUCIL) chemo injection 800 mg, 400 mg/m2, Intravenous,  Once, 0 of 4 cycles  pegfilgrastim (NEULASTA) injection 6 mg, 6 mg, Subcutaneous,  Once, 2  of 2 cycles  ondansetron (ZOFRAN) IVPB 8 mg, 8 mg, Intravenous,  Once, 12 of 12 cycles  leucovorin 660 mg in dextrose 5 % 250 mL infusion, 660 mg (original dose ), Intravenous,  Once, 12 of 12 cycles Dose modification: 660 mg (Cycle 1)  oxaliplatin (ELOXATIN) 140 mg in dextrose 5 % 500 mL chemo infusion, 140 mg (original dose ), Intravenous,  Once, 12 of 12 cycles Dose modification: 140 mg (Cycle 1), 119 mg (85 % of original dose 140 mg, Cycle 8, Reason: Provider Judgment)  fluorouracil (ADRUCIL) 3,850 mg in sodium chloride 0.9 % 150 mL chemo infusion, 3,850 mg (original dose ), Intravenous, 1 Day/Dose, 12 of 12 cycles Dose modification: 3,850 mg (Cycle 1)  fluorouracil (ADRUCIL) chemo injection 700 mg, 700 mg (original dose ), Intravenous,  Once, 12 of 12 cycles Dose modification: 700 mg (Cycle 1)  palonosetron (ALOXI) injection 0.25 mg, 0.25 mg, Intravenous,  Once, 7 of 12 cycles Administration: 0.25 mg (09/30/2016)  bevacizumab (AVASTIN) 475 mg in sodium chloride 0.9 % 100 mL chemo infusion, 5 mg/kg = 475 mg, Intravenous,  Once, 6 of 11 cycles Administration: 500 mg (09/30/2016)  leucovorin 880 mg in dextrose 5 % 250 mL infusion, 400 mg/m2 = 880 mg, Intravenous,  Once, 7 of 12 cycles Administration: 880 mg (09/30/2016)  oxaliplatin (ELOXATIN) 185 mg in dextrose 5 % 500 mL chemo infusion, 85 mg/m2 = 185 mg, Intravenous,  Once, 7 of 12 cycles Administration: 185 mg (09/30/2016)  fluorouracil (ADRUCIL) chemo injection 900 mg, 400 mg/m2 = 900 mg, Intravenous,  Once, 7 of 12 cycles Administration: 900 mg (09/30/2016)  fluorouracil (ADRUCIL) 5,300 mg in sodium chloride 0.9 % 144 mL chemo infusion, 2,400  mg/m2 = 5,300 mg, Intravenous, 1 Day/Dose, 7 of 12 cycles Administration: 5,300 mg (09/30/2016)  for chemotherapy treatment.     08/27/2016 Imaging   CT CAP- 1. Pulmonary metastatic disease is stable. 2. Pancreatic head mass, grossly stable, with progressive atrophy of the body and tail  of the pancreas. Stable peripancreatic lymph node. 3. Mild circumferential rectal wall thickening, stable. 4. Aortic atherosclerosis (ICD10-170.0). Coronary artery calcification. 5. Hyperattenuating lesion off the lower pole left kidney, stable, too small to characterize. Continued attention on followup exams is warranted. 6. Persistent wall thickening and mild dilatation of the proximal appendix, of uncertain etiology. Continued attention on followup exams is warranted.   11/06/2016 Imaging   CT C/A/P: IMPRESSION: 1. Stable pulmonary metastatic disease. 2. Similar appearing pancreatic head mass. Progressed atrophy of the pancreatic body and tail. Stable peripancreatic lymph node. 3. Stable hypoattenuating lesion partially exophytic off the inferior pole of the left kidney. 4. Aortic atherosclerosis. 5. Persistent mild wall thickening and mild dilatation of the appendix.    01/30/2017 Imaging   Ct C/A/P: IMPRESSION: 1. Stable pulmonary metastatic disease. 2. Stable to slight increase in size of pancreatic head mass. 3.  Aortic Atherosclerosis (ICD10-I70.0). 4. Similar appearance of mild wall thickening of the appendix which contains an appendicolith.   05/01/2017 Imaging   CT C/A/P: Stable disease in the lungs, no progression of disease in the abdomen.   10/04/2018 -  Chemotherapy   The patient had palonosetron (ALOXI) injection 0.25 mg, 0.25 mg, Intravenous,  Once, 12 of 14 cycles Administration: 0.25 mg (10/04/2018), 0.25 mg (10/25/2018), 0.25 mg (12/01/2018), 0.25 mg (12/15/2018), 0.25 mg (12/29/2018), 0.25 mg (01/12/2019), 0.25 mg (01/26/2019), 0.25 mg (02/09/2019), 0.25 mg (05/11/2019), 0.25 mg (05/25/2019), 0.25 mg (06/08/2019), 0.25 mg (06/22/2019) pegfilgrastim (NEULASTA) injection 6 mg, 6 mg, Subcutaneous, Once, 2 of 2 cycles Administration: 6 mg (10/27/2018) pegfilgrastim-cbqv (UDENYCA) injection 6 mg, 6 mg, Subcutaneous, Once, 10 of 12 cycles Administration: 6 mg (12/03/2018), 6 mg  (12/17/2018), 6 mg (12/31/2018), 6 mg (01/14/2019), 6 mg (01/28/2019), 6 mg (02/11/2019), 6 mg (05/13/2019), 6 mg (05/27/2019), 6 mg (06/10/2019), 6 mg (06/24/2019) bevacizumab (AVASTIN) 450 mg in sodium chloride 0.9 % 100 mL chemo infusion, 5 mg/kg = 450 mg, Intravenous,  Once, 4 of 4 cycles Administration: 400 mg (12/01/2018), 400 mg (12/15/2018), 400 mg (12/29/2018) irinotecan (CAMPTOSAR) 380 mg in sodium chloride 0.9 % 500 mL chemo infusion, 180 mg/m2 = 380 mg, Intravenous,  Once, 12 of 14 cycles Dose modification: 144 mg/m2 (original dose 180 mg/m2, Cycle 2, Reason: Provider Judgment, Comment: 20% dose reduction), 90 mg/m2 (50 % of original dose 180 mg/m2, Cycle 3, Reason: Other (see comments), Comment: [id number]) Administration: 380 mg (10/04/2018), 300 mg (10/25/2018), 200 mg (12/01/2018), 200 mg (12/15/2018), 200 mg (12/29/2018), 200 mg (01/12/2019), 200 mg (01/26/2019), 200 mg (02/09/2019), 180 mg (05/11/2019), 180 mg (05/25/2019), 180 mg (06/08/2019), 180 mg (06/22/2019) leucovorin 900 mg in sodium chloride 0.9 % 250 mL infusion, 421 mg/m2 = 856 mg, Intravenous,  Once, 12 of 14 cycles Dose modification: 320 mg/m2 (original dose 400 mg/m2, Cycle 2, Reason: Provider Judgment, Comment: 20% dose reduction) Administration: 900 mg (10/04/2018), 700 mg (10/25/2018), 700 mg (12/01/2018), 700 mg (12/15/2018), 700 mg (12/29/2018), 700 mg (01/12/2019), 700 mg (01/26/2019), 700 mg (02/09/2019), 700 mg (05/11/2019), 700 mg (05/25/2019), 700 mg (06/08/2019), 700 mg (06/22/2019) fluorouracil (ADRUCIL) chemo injection 850 mg, 400 mg/m2 = 850 mg, Intravenous,  Once, 12 of 14 cycles Dose modification: 320 mg/m2 (original dose 400 mg/m2, Cycle 2, Reason: Provider  Judgment, Comment: 20% dose reduction) Administration: 850 mg (10/04/2018), 700 mg (10/25/2018), 700 mg (12/01/2018), 700 mg (12/15/2018), 700 mg (12/29/2018), 700 mg (01/12/2019), 700 mg (01/26/2019), 700 mg (02/09/2019), 700 mg (05/11/2019), 700 mg (05/25/2019), 700 mg (06/08/2019), 700 mg (06/22/2019) fosaprepitant  (EMEND) 150 mg, dexamethasone (DECADRON) 12 mg in sodium chloride 0.9 % 145 mL IVPB, , Intravenous,  Once, 10 of 12 cycles Administration:  (12/01/2018),  (12/15/2018),  (12/29/2018),  (01/12/2019),  (01/26/2019),  (02/09/2019),  (05/11/2019),  (05/25/2019),  (06/08/2019),  (06/22/2019) fluorouracil (ADRUCIL) 5,000 mg in sodium chloride 0.9 % 150 mL chemo infusion, 2,325 mg/m2 = 5,150 mg, Intravenous, 1 Day/Dose, 12 of 14 cycles Dose modification: 1,920 mg/m2 (original dose 2,400 mg/m2, Cycle 2, Reason: Provider Judgment, Comment: 20% dose reduction) Administration: 5,000 mg (10/04/2018), 4,100 mg (10/25/2018), 4,100 mg (12/01/2018), 4,100 mg (12/15/2018), 4,100 mg (12/29/2018), 4,100 mg (01/12/2019), 4,100 mg (01/26/2019), 4,100 mg (02/09/2019), 4,100 mg (05/11/2019), 4,100 mg (05/25/2019), 4,100 mg (06/08/2019), 4,100 mg (06/22/2019) bevacizumab-bvzr (ZIRABEV) 400 mg in sodium chloride 0.9 % 100 mL chemo infusion, 5 mg/kg = 400 mg (100 % of original dose 5 mg/kg), Intravenous,  Once, 4 of 6 cycles Dose modification: 5 mg/kg (original dose 5 mg/kg, Cycle 6) Administration: 400 mg (01/12/2019), 400 mg (01/26/2019), 400 mg (02/09/2019)  for chemotherapy treatment.       CANCER STAGING: Cancer Staging Rectal cancer ([place]) Staging form: Colon and Rectum, AJCC 7th Edition - Clinical: Stage IIIB (T3, N1, M0) - Signed by [name], MD on 02/04/2011 - Pathologic stage from 11/17/2014: Stage IVA (M1a) - Signed by Baird Cancer, PA-C on 09/14/2015    INTERVAL HISTORY:  Mr. Berthold 69 y.o. male seen for follow-up of metastatic rectal cancer and toxicity assessment prior to cycle 5 of chemotherapy.  Cycle 4 was 2 weeks ago.  He reported having diarrhea for 2 days after the pump was discontinued.  He also felt weak for 1 week.  He reported body pains lasting about 4 days after the G-CSF injection.  He is taking Percocet 3 times a day and pain in the abdomen and right lateral chest wall is well controlled.  Denies any fevers or  chills.  Overall feels weak.  Appetite is 100%.  Energy levels are 50%.  REVIEW OF SYSTEMS:  Review of Systems  Constitutional: Positive for fatigue.  Respiratory: Positive for shortness of breath.   Gastrointestinal: Positive for abdominal pain.  Neurological: Positive for numbness.  Psychiatric/Behavioral: Positive for sleep disturbance.  All other systems reviewed and are negative.    PAST MEDICAL/SURGICAL HISTORY:  Past Medical History:  Diagnosis Date  . Chronic anticoagulation   . Colon cancer ([place]) 07/24/2010   rectal ca, inv adenocarcinoma  . Depression 04/01/2011  . Diabetes mellitus without complication ([place])   . DVT (deep venous thrombosis) ([place]) 05/09/2011  . GERD (gastroesophageal reflux disease)   . High output ileostomy ([place]) 05/21/2011  . History of kidney stones   . HTN (hypertension)   . Hx of radiation therapy 09/02/10 to 10/14/10   pelvis  . Lung metastasis ([place])   . Neuropathy   . Peripheral vascular disease ([place])    dvt's,pe  . Pneumonia    hx x3  . Pulmonary embolism ([place])   . Rectal cancer ([place]) 01/15/2011   S/P radiation and concurrent 5-FU continuous infusion from 09/09/10- 10/10/10.  S/P proctectomy with colorectal anastomosis and diverting loop ileostomy on 11/14/10 at [facility] by Dr. Harlon Ditty. Pathology reveals a pT3b N1 with 3/20 lymph  nodes.     Past Surgical History:  Procedure Laterality Date  . BILIARY STENT PLACEMENT N/A 09/16/2018   Procedure: STENT PLACEMENT;  Surgeon: Rogene Houston, MD;  [facility];  Service: Endoscopy;  Laterality: N/A;  . BIOPSY  09/24/2018   Procedure: BIOPSY;  Surgeon: Danie Binder, MD;  [facility];  Service: Endoscopy;;  . COLON SURGERY  11/14/2010   proctectomy with colorectal anastomosis and diverting loop ileostomy (temporary planned)  . COLONOSCOPY  07/2010   proximal rectal apple core mass 10-14cm from anal verge (adenocarcinoma), 2-3cm distal rectal carpet polyp s/p piecemeal snare  polypectomy (adenoma)  . COLONOSCOPY  04/21/2012   RMR: Friable,fibrotic appearing colorectal anastomosis producing some luminal narrowing-not felt to be critical. path: focal erosion with slight inflammation and hyperemia. SURVEILLANCE DUE DEC 2015  . COLONOSCOPY N/A 11/24/2013   Dr. Rourk:somewhat fibrotic/friable anastomotic mucosal-status post biopsy (narrowing not felt to be clinically significant) Single colonic diverticulum. benign polypoid rectal mucosa  . colostomy reversal  april 2013  . ERCP N/A 09/16/2018   Procedure: ENDOSCOPIC RETROGRADE CHOLANGIOPANCREATOGRAPHY (ERCP);  Surgeon: Rogene Houston, MD;  [facility];  Service: Endoscopy;  Laterality: N/A;  . ESOPHAGOGASTRODUODENOSCOPY  07/2010   RMR: schatki ring s/p dilation, small hh, SB bx benign  . ESOPHAGOGASTRODUODENOSCOPY N/A 09/04/2015   Procedure: ESOPHAGOGASTRODUODENOSCOPY (EGD);  Surgeon: Daneil Dolin, MD;  [facility];  Service: Endoscopy;  Laterality: N/A;  730  . EUS  08/2010   Dr. Owens Loffler. uT3N0 circumferential, nearly obstruction rectosigmoid adenocarcinoma, distal edge 12cm from anal verge  . EUS N/A 06/19/2016   Procedure: UPPER ENDOSCOPIC ULTRASOUND (EUS) RADIAL;  Surgeon: Milus Banister, MD;  [facility];  Service: Endoscopy;  Laterality: N/A;  . FLEXIBLE SIGMOIDOSCOPY N/A 09/24/2018   Procedure: FLEXIBLE SIGMOIDOSCOPY;  Surgeon: Danie Binder, MD;  [facility];  Service: Endoscopy;  Laterality: N/A;  . HERNIA REPAIR     abd hernia repair  . IVC filter    . ivc filter    . port a cath placement    . PORT-A-CATH REMOVAL  09/24/2011   Procedure: REMOVAL PORT-A-CATH;  Surgeon: Donato Heinz, MD;  Location: [facility];  Service: General;  Laterality: N/A;  Minor Room  . PORTACATH PLACEMENT Right 07/07/2016   Procedure: INSERTION PORT-A-CATH;  Surgeon: Aviva Signs, MD;  Location: [facility];  Service: General;  Laterality: Right;  . SPHINCTEROTOMY N/A 09/16/2018    Procedure: SPHINCTEROTOMY with balloon dialation;  Surgeon: Rogene Houston, MD;  [facility];  Service: Endoscopy;  Laterality: N/A;  . TRANSVERSE LOOP COLOSTOMY N/A 09/29/2018   Procedure: TRANSVERSE LOOP COLOSTOMY;  Surgeon: Aviva Signs, MD;  Location: [facility];  Service: General;  Laterality: N/A;  . VIDEO ASSISTED THORACOSCOPY (VATS)/WEDGE RESECTION Right 11/15/2014   Procedure: VIDEO ASSISTED THORACOSCOPY (VATS)/LUNG RESECTION WITH RIGHT LINGULECTOMY;  Surgeon: [name], MD;  Location: [place];  Service: Thoracic;  Laterality: Right;  Marland Kitchen VIDEO BRONCHOSCOPY N/A 11/15/2014   Procedure: VIDEO BRONCHOSCOPY;  Surgeon: [name], MD;  Location: [facility] OR;  Service: Thoracic;  Laterality: N/A;     SOCIAL HISTORY:  Social History   Socioeconomic History  . Marital status: Married    Spouse name: Not on file  . Number of children: 2  . Years of education: Not on file  . Highest education level: Not on file  Occupational History  . Occupation: self-employed Regulatory affairs officer, currently not working  Employer: SELF EMPLOYED  Tobacco Use  . Smoking status: Never Smoker  . Smokeless tobacco: Never Used  Substance and Sexual Activity  . Alcohol use: No  . Drug use: No  . Sexual activity: Yes    Birth control/protection: None    Comment: married  Other Topics Concern  . Not on file  Social History Narrative  . Not on file   Social Determinants of Health   Financial Resource Strain: Low Risk   . Difficulty of Paying Living Expenses: Not very hard  Food Insecurity: No Food Insecurity  . Worried About Charity fundraiser in the Last Year: Never true  . Ran Out of Food in the Last Year: Never true  Transportation Needs: No Transportation Needs  . Lack of Transportation (Medical): No  . Lack of Transportation (Non-Medical): No  Physical Activity: Inactive  . Days of Exercise per Week: 0 days  . Minutes of Exercise per Session: 0 min  Stress: No  Stress Concern Present  . Feeling of Stress : Only a little  Social Connections: Unknown  . Frequency of Communication with Friends and Family: More than three times a week  . Frequency of Social Gatherings with Friends and Family: Once a week  . Attends Religious Services: Patient refused  . Active Member of Clubs or Organizations: Patient refused  . Attends Archivist Meetings: Patient refused  . Marital Status: Patient refused  Intimate Partner Violence: Unknown  . Fear of Current or Ex-Partner: Patient refused  . Emotionally Abused: Patient refused  . Physically Abused: Patient refused  . Sexually Abused: Patient refused    FAMILY HISTORY:  Family History  Problem Relation Age of Onset  . Cancer Brother        throat  . Cancer Brother        prostate  . Colon cancer Neg Hx   . Liver disease Neg Hx   . Inflammatory bowel disease Neg Hx     CURRENT MEDICATIONS:  Outpatient Encounter Medications as of 07/11/2019  Medication Sig  . clonazePAM (KLONOPIN) 1 MG tablet TAKE 1 TABLET BY MOUTH THREE TIMES DAILY AS NEEDED  . dronabinol (MARINOL) 5 MG capsule Take 1 capsule (5 mg total) by mouth 2 (two) times daily before a meal.  . gabapentin (NEURONTIN) 300 MG capsule Take 3 capsules (900 mg total) by mouth 2 (two) times daily.  Marland Kitchen glimepiride (AMARYL) 2 MG tablet Take 2 mg by mouth 2 (two) times daily before a meal.   . magnesium oxide (MAG-OX) 400 (241.3 Mg) MG tablet Take 1 tablet (400 mg total) by mouth 2 (two) times daily.  . mirtazapine (REMERON) 15 MG tablet Take 7.5 mg by mouth at bedtime.  . pantoprazole (PROTONIX) 40 MG tablet Take 40 mg by mouth every evening.   . Probiotic Product (PROBIOTIC DAILY PO) Take 1 capsule by mouth 2 (two) times daily.   . rivaroxaban (XARELTO) 20 MG TABS tablet Take 1 tablet (20 mg total) by mouth daily with supper.  . vitamin B-12 (CYANOCOBALAMIN) 1000 MCG tablet Take 1,000 mcg by mouth daily.  Marland Kitchen acetaminophen (TYLENOL) 500 MG tablet  Take 1,000 mg by mouth every 6 (six) hours as needed for mild pain or moderate pain.   . Diphenhyd-Hydrocort-Nystatin (FIRST-DUKES MOUTHWASH) SUSP Use as directed 5 mLs in the mouth or throat 4 (four) times daily. (Patient not taking: Reported on 07/11/2019)  . ondansetron (ZOFRAN) 4 MG tablet Take 1 tablet (4 mg total) by mouth daily as  needed for nausea or vomiting. (Patient not taking: Reported on 07/11/2019)  . oxyCODONE-acetaminophen (PERCOCET) 10-325 MG tablet Take 1 tablet by mouth every 8 (eight) hours as needed for pain.  . [DISCONTINUED] oxyCODONE-acetaminophen (PERCOCET) 10-325 MG tablet Take 1 tablet by mouth every 8 (eight) hours as needed for pain. (Patient not taking: Reported on 07/11/2019)   Facility-Administered Encounter Medications as of 07/11/2019  Medication  . sodium chloride 0.9 % injection 10 mL  . sodium chloride flush (NS) 0.9 % injection 10 mL    ALLERGIES:  Allergies  Allergen Reactions  . Oxycodone     Blisters, hallucinations  . Tramadol     Blisters, hallucinations  . Trazodone And Nefazodone Other (See Comments)    hallucinations     PHYSICAL EXAM:  ECOG Performance status: 1  Vitals:   07/11/19 0816  BP: 116/68  Pulse: 99  Resp: 18  Temp: (!) 97.3 F (36.3 C)  SpO2: 100%   Filed Weights   07/11/19 0816  Weight: 163 lb (73.9 kg)    Physical Exam Constitutional:      Appearance: Normal appearance.  HENT:     Head: Normocephalic.     Nose: Nose normal.     Mouth/Throat:     Mouth: Mucous membranes are moist.     Pharynx: Oropharynx is clear.  Eyes:     Extraocular Movements: Extraocular movements intact.     Conjunctiva/sclera: Conjunctivae normal.  Cardiovascular:     Rate and Rhythm: Regular rhythm. Tachycardia present.     Pulses: Normal pulses.     Heart sounds: Normal heart sounds.  Pulmonary:     Effort: Pulmonary effort is normal.     Breath sounds: Normal breath sounds.  Abdominal:     General: Abdomen is flat. Bowel sounds  are normal.     Palpations: Abdomen is soft.  Musculoskeletal:        General: Normal range of motion.     Cervical back: Normal range of motion.  Skin:    General: Skin is warm and dry.  Neurological:     General: No focal deficit present.     Mental Status: He is alert and oriented to person, place, and time.  Psychiatric:        Mood and Affect: Mood normal.        Behavior: Behavior normal.        Thought Content: Thought content normal.        Judgment: Judgment normal.      LABORATORY DATA:  I have reviewed the labs as listed.  CBC    Component Value Date/Time   WBC 9.4 07/11/2019 0805   RBC 4.29 07/11/2019 0805   HGB 12.0 (L) 07/11/2019 0805   HGB 11.6 (L) 02/04/2011 1059   HCT 38.3 (L) 07/11/2019 0805   HCT 34.7 (L) 02/04/2011 1059   PLT 317 07/11/2019 0805   PLT 282 02/04/2011 1059   MCV 89.3 07/11/2019 0805   MCV 76.0 (L) 02/04/2011 1059   MCH 28.0 07/11/2019 0805   MCHC 31.3 07/11/2019 0805   RDW 16.1 (H) 07/11/2019 0805   RDW 18.5 (H) 02/04/2011 1059   LYMPHSABS 1.7 07/11/2019 0805   LYMPHSABS 1.1 02/04/2011 1059   MONOABS 0.8 07/11/2019 0805   MONOABS 0.5 02/04/2011 1059   EOSABS 0.2 07/11/2019 0805   EOSABS 0.1 02/04/2011 1059   BASOSABS 0.1 07/11/2019 0805   BASOSABS 0.1 02/04/2011 1059   CMP Latest Ref Rng & Units 07/11/2019 06/22/2019 06/08/2019  Glucose 70 - 99 mg/dL 290(H) 223(H) 275(H)  BUN 8 - 23 mg/dL '8 8 9  '$ Creatinine 0.61 - 1.24 mg/dL 0.86 0.75 0.73  Sodium 135 - 145 mmol/L 137 137 137  Potassium 3.5 - 5.1 mmol/L 4.2 4.3 4.1  Chloride 98 - 111 mmol/L 104 104 105  CO2 22 - 32 mmol/L '26 26 25  '$ Calcium 8.9 - 10.3 mg/dL 8.3(L) 8.3(L) 8.2(L)  Total Protein 6.5 - 8.1 g/dL 5.4(L) 5.4(L) 5.1(L)  Total Bilirubin 0.3 - 1.2 mg/dL 1.0 0.3 0.5  Alkaline Phos 38 - 126 U/L 391(H) 189(H) 200(H)  AST 15 - 41 U/L 242(H) 18 22  ALT 0 - 44 U/L 188(H) 18 44    I have independently reviewed the scans.   ASSESSMENT & PLAN:   Rectal cancer ([place]) 1.   Stage IV rectal cancer with lung and pancreatic metastasis: -Foundation 1 with K-ras G13D, NRAS wild-type, MSI stable. -4 cycles of FOLFIRI dose reduced from 05/11/2019 through 06/16/2019. -He felt very weak for 1 week after last treatment.  He had diarrhea for 2 days after the pump was discontinued.  He also reported body aches which lasted 4 days after G-CSF injection. -We reviewed his labs.  LFTs are elevated with AST elevated more than 6 times upper limit of normal. -I have recommended holding his treatment today.  We will reevaluate him in 1 week with repeat labs and possible treatment.  2.  Right lower quadrant pain: -He has abdominal pain in the right lower quadrant.  He also has right lateral abdominal wall pain which goes to the back. -This is controlled with oxycodone 10 mg 3 times a day.  We will refill his pain medication.  3.  Anxiety: -He will continue Klonopin 1 mg twice daily which is helping.  4.  Weight loss: -He is drinking about 2 cans of boost per day with 30 g of protein in it.  5.  Neuropathy: -She will continue gabapentin 3 times a day.  6.  Hypomagnesemia: -He is taking magnesium twice daily.  His magnesium today is 1.6.  He will receive 2 g of IV magnesium.  7.  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  He will continue Xarelto.  He is not having any bleeding issues.    Orders placed this encounter:  No orders of the defined types were placed in this encounter.     Derek Jack, MD  [place] [phone]

## 2019-07-12 ENCOUNTER — Other Ambulatory Visit (HOSPITAL_COMMUNITY): Payer: Self-pay | Admitting: *Deleted

## 2019-07-12 LAB — CEA: CEA: 14 ng/mL — ABNORMAL HIGH (ref 0.0–4.7)

## 2019-07-13 ENCOUNTER — Encounter (HOSPITAL_COMMUNITY): Payer: Medicare Other

## 2019-07-16 ENCOUNTER — Inpatient Hospital Stay (HOSPITAL_COMMUNITY)
Admission: EM | Admit: 2019-07-16 | Discharge: 2019-07-18 | DRG: 445 | Disposition: A | Discharge status: Home / Self Care | Payer: Medicare Other | Attending: Internal Medicine | Admitting: Internal Medicine

## 2019-07-16 ENCOUNTER — Encounter (HOSPITAL_COMMUNITY): Payer: Self-pay

## 2019-07-16 ENCOUNTER — Emergency Department (HOSPITAL_COMMUNITY): Payer: Medicare Other

## 2019-07-16 ENCOUNTER — Other Ambulatory Visit: Payer: Self-pay

## 2019-07-16 DIAGNOSIS — K8021 Calculus of gallbladder without cholecystitis with obstruction: Principal | ICD-10-CM | POA: Diagnosis present

## 2019-07-16 DIAGNOSIS — R945 Abnormal results of liver function studies: Secondary | ICD-10-CM | POA: Diagnosis not present

## 2019-07-16 DIAGNOSIS — Z902 Acquired absence of lung [part of]: Secondary | ICD-10-CM | POA: Diagnosis not present

## 2019-07-16 DIAGNOSIS — K869 Disease of pancreas, unspecified: Secondary | ICD-10-CM | POA: Diagnosis present

## 2019-07-16 DIAGNOSIS — K838 Other specified diseases of biliary tract: Secondary | ICD-10-CM | POA: Diagnosis present

## 2019-07-16 DIAGNOSIS — C787 Secondary malignant neoplasm of liver and intrahepatic bile duct: Secondary | ICD-10-CM | POA: Diagnosis present

## 2019-07-16 DIAGNOSIS — Z95828 Presence of other vascular implants and grafts: Secondary | ICD-10-CM

## 2019-07-16 DIAGNOSIS — C19 Malignant neoplasm of rectosigmoid junction: Secondary | ICD-10-CM | POA: Diagnosis present

## 2019-07-16 DIAGNOSIS — C7800 Secondary malignant neoplasm of unspecified lung: Secondary | ICD-10-CM | POA: Diagnosis present

## 2019-07-16 DIAGNOSIS — Z419 Encounter for procedure for purposes other than remedying health state, unspecified: Secondary | ICD-10-CM

## 2019-07-16 DIAGNOSIS — G893 Neoplasm related pain (acute) (chronic): Secondary | ICD-10-CM | POA: Diagnosis present

## 2019-07-16 DIAGNOSIS — K8681 Exocrine pancreatic insufficiency: Secondary | ICD-10-CM | POA: Diagnosis present

## 2019-07-16 DIAGNOSIS — C7889 Secondary malignant neoplasm of other digestive organs: Secondary | ICD-10-CM | POA: Diagnosis present

## 2019-07-16 DIAGNOSIS — R7989 Other specified abnormal findings of blood chemistry: Secondary | ICD-10-CM

## 2019-07-16 DIAGNOSIS — F329 Major depressive disorder, single episode, unspecified: Secondary | ICD-10-CM | POA: Diagnosis present

## 2019-07-16 DIAGNOSIS — R079 Chest pain, unspecified: Secondary | ICD-10-CM | POA: Diagnosis present

## 2019-07-16 DIAGNOSIS — C7951 Secondary malignant neoplasm of bone: Secondary | ICD-10-CM | POA: Diagnosis present

## 2019-07-16 DIAGNOSIS — E119 Type 2 diabetes mellitus without complications: Secondary | ICD-10-CM

## 2019-07-16 DIAGNOSIS — Z86711 Personal history of pulmonary embolism: Secondary | ICD-10-CM | POA: Diagnosis present

## 2019-07-16 DIAGNOSIS — Z86718 Personal history of other venous thrombosis and embolism: Secondary | ICD-10-CM

## 2019-07-16 DIAGNOSIS — D638 Anemia in other chronic diseases classified elsewhere: Secondary | ICD-10-CM | POA: Diagnosis present

## 2019-07-16 DIAGNOSIS — C20 Malignant neoplasm of rectum: Secondary | ICD-10-CM | POA: Diagnosis present

## 2019-07-16 DIAGNOSIS — Z20822 Contact with and (suspected) exposure to covid-19: Secondary | ICD-10-CM | POA: Diagnosis present

## 2019-07-16 DIAGNOSIS — Z888 Allergy status to other drugs, medicaments and biological substances status: Secondary | ICD-10-CM

## 2019-07-16 DIAGNOSIS — Z800 Family history of malignant neoplasm of digestive organs: Secondary | ICD-10-CM

## 2019-07-16 DIAGNOSIS — R1030 Lower abdominal pain, unspecified: Secondary | ICD-10-CM | POA: Diagnosis present

## 2019-07-16 DIAGNOSIS — R17 Unspecified jaundice: Secondary | ICD-10-CM | POA: Diagnosis present

## 2019-07-16 DIAGNOSIS — K219 Gastro-esophageal reflux disease without esophagitis: Secondary | ICD-10-CM | POA: Diagnosis present

## 2019-07-16 DIAGNOSIS — K7689 Other specified diseases of liver: Secondary | ICD-10-CM | POA: Diagnosis not present

## 2019-07-16 DIAGNOSIS — I10 Essential (primary) hypertension: Secondary | ICD-10-CM | POA: Diagnosis present

## 2019-07-16 DIAGNOSIS — Z923 Personal history of irradiation: Secondary | ICD-10-CM

## 2019-07-16 DIAGNOSIS — Z885 Allergy status to narcotic agent status: Secondary | ICD-10-CM

## 2019-07-16 DIAGNOSIS — Z833 Family history of diabetes mellitus: Secondary | ICD-10-CM

## 2019-07-16 DIAGNOSIS — Z87442 Personal history of urinary calculi: Secondary | ICD-10-CM

## 2019-07-16 DIAGNOSIS — Z79899 Other long term (current) drug therapy: Secondary | ICD-10-CM

## 2019-07-16 DIAGNOSIS — Z933 Colostomy status: Secondary | ICD-10-CM

## 2019-07-16 DIAGNOSIS — Z7901 Long term (current) use of anticoagulants: Secondary | ICD-10-CM

## 2019-07-16 DIAGNOSIS — F419 Anxiety disorder, unspecified: Secondary | ICD-10-CM | POA: Diagnosis present

## 2019-07-16 DIAGNOSIS — K5900 Constipation, unspecified: Secondary | ICD-10-CM | POA: Diagnosis present

## 2019-07-16 DIAGNOSIS — R109 Unspecified abdominal pain: Secondary | ICD-10-CM | POA: Diagnosis present

## 2019-07-16 DIAGNOSIS — R1031 Right lower quadrant pain: Secondary | ICD-10-CM | POA: Diagnosis not present

## 2019-07-16 DIAGNOSIS — R911 Solitary pulmonary nodule: Secondary | ICD-10-CM | POA: Diagnosis present

## 2019-07-16 DIAGNOSIS — Z9221 Personal history of antineoplastic chemotherapy: Secondary | ICD-10-CM

## 2019-07-16 DIAGNOSIS — Z79891 Long term (current) use of opiate analgesic: Secondary | ICD-10-CM

## 2019-07-16 DIAGNOSIS — E1151 Type 2 diabetes mellitus with diabetic peripheral angiopathy without gangrene: Secondary | ICD-10-CM | POA: Diagnosis present

## 2019-07-16 LAB — COMPREHENSIVE METABOLIC PANEL
ALT: 293 U/L — ABNORMAL HIGH (ref 0–44)
AST: 276 U/L — ABNORMAL HIGH (ref 15–41)
Albumin: 2.9 g/dL — ABNORMAL LOW (ref 3.5–5.0)
Alkaline Phosphatase: 596 U/L — ABNORMAL HIGH (ref 38–126)
Anion gap: 10 (ref 5–15)
BUN: 10 mg/dL (ref 8–23)
CO2: 22 mmol/L (ref 22–32)
Calcium: 8.3 mg/dL — ABNORMAL LOW (ref 8.9–10.3)
Chloride: 101 mmol/L (ref 98–111)
Creatinine, Ser: 0.65 mg/dL (ref 0.61–1.24)
GFR calc Af Amer: >60 mL/min (ref >60)
GFR calc non Af Amer: >60 mL/min (ref >60)
Glucose, Bld: 183 mg/dL — ABNORMAL HIGH (ref 70–99)
Potassium: 3.6 mmol/L (ref 3.5–5.1)
Sodium: 133 mmol/L — ABNORMAL LOW (ref 135–145)
Total Bilirubin: 4.8 mg/dL — ABNORMAL HIGH (ref 0.3–1.2)
Total Protein: 5.6 g/dL — ABNORMAL LOW (ref 6.5–8.1)

## 2019-07-16 LAB — URINALYSIS, ROUTINE W REFLEX MICROSCOPIC
Bilirubin Urine: NEGATIVE
Glucose, UA: NEGATIVE mg/dL
Hgb urine dipstick: NEGATIVE
Ketones, ur: NEGATIVE mg/dL
Leukocytes,Ua: NEGATIVE
Nitrite: NEGATIVE
Protein, ur: NEGATIVE mg/dL
Specific Gravity, Urine: >1.046 — ABNORMAL HIGH (ref 1.005–1.030)
pH: 8 (ref 5.0–8.0)

## 2019-07-16 LAB — CBC WITH DIFFERENTIAL/PLATELET
Abs Immature Granulocytes: 0.08 10*3/uL — ABNORMAL HIGH (ref 0.00–0.07)
Basophils Absolute: 0.1 10*3/uL (ref 0.0–0.1)
Basophils Relative: 0 %
Eosinophils Absolute: 0.1 10*3/uL (ref 0.0–0.5)
Eosinophils Relative: 0 %
HCT: 34.6 % — ABNORMAL LOW (ref 39.0–52.0)
Hemoglobin: 11.2 g/dL — ABNORMAL LOW (ref 13.0–17.0)
Immature Granulocytes: 1 %
Lymphocytes Relative: 8 %
Lymphs Abs: 1.1 10*3/uL (ref 0.7–4.0)
MCH: 28.1 pg (ref 26.0–34.0)
MCHC: 32.4 g/dL (ref 30.0–36.0)
MCV: 86.9 fL (ref 80.0–100.0)
Monocytes Absolute: 1.3 10*3/uL — ABNORMAL HIGH (ref 0.1–1.0)
Monocytes Relative: 10 %
Neutro Abs: 10.8 10*3/uL — ABNORMAL HIGH (ref 1.7–7.7)
Neutrophils Relative %: 81 %
Platelets: 308 10*3/uL (ref 150–400)
RBC: 3.98 MIL/uL — ABNORMAL LOW (ref 4.22–5.81)
RDW: 17.2 % — ABNORMAL HIGH (ref 11.5–15.5)
WBC: 13.4 10*3/uL — ABNORMAL HIGH (ref 4.0–10.5)
nRBC: 0 % (ref 0.0–0.2)

## 2019-07-16 LAB — TROPONIN I (HIGH SENSITIVITY)
Troponin I (High Sensitivity): 3 ng/L (ref <18)
Troponin I (High Sensitivity): 4 ng/L (ref <18)

## 2019-07-16 LAB — LIPASE, BLOOD: Lipase: 11 U/L (ref 11–51)

## 2019-07-16 MED ORDER — POLYETHYLENE GLYCOL 3350 17 G PO PACK
17.0000 g | PACK | Freq: Once | ORAL | Status: AC
  Administered 2019-07-17: 17 g via ORAL
  Filled 2019-07-16: qty 1

## 2019-07-16 MED ORDER — HYDROMORPHONE HCL 1 MG/ML IJ SOLN
1.0000 mg | Freq: Once | INTRAMUSCULAR | Status: AC
  Administered 2019-07-17: 1 mg via INTRAVENOUS
  Filled 2019-07-16 (×2): qty 1

## 2019-07-16 MED ORDER — IOHEXOL 350 MG/ML SOLN
100.0000 mL | Freq: Once | INTRAVENOUS | Status: AC | PRN
  Administered 2019-07-16: 100 mL via INTRAVENOUS

## 2019-07-16 MED ORDER — POLYETHYLENE GLYCOL 3350 17 G PO PACK: 17.0000 g | PACK | Freq: Two times a day (BID) | ORAL | Status: DC

## 2019-07-16 MED ORDER — SODIUM CHLORIDE 0.9 % IV SOLN: INTRAVENOUS | Status: DC

## 2019-07-16 MED ORDER — CLONAZEPAM 0.5 MG PO TABS
1.0000 mg | ORAL_TABLET | Freq: Three times a day (TID) | ORAL | Status: DC | PRN
  Administered 2019-07-17: 1 mg via ORAL
  Filled 2019-07-16: qty 2

## 2019-07-16 MED ORDER — ONDANSETRON HCL 4 MG/2ML IJ SOLN
4.0000 mg | Freq: Once | INTRAMUSCULAR | Status: AC
  Administered 2019-07-16: 4 mg via INTRAVENOUS
  Filled 2019-07-16: qty 2

## 2019-07-16 MED ORDER — INSULIN ASPART 100 UNIT/ML ~~LOC~~ SOLN: 0.0000 [IU] | Freq: Three times a day (TID) | SUBCUTANEOUS | Status: DC

## 2019-07-16 MED ORDER — OXYCODONE-ACETAMINOPHEN 10-325 MG PO TABS: 1.0000 | ORAL_TABLET | ORAL | Status: DC | PRN

## 2019-07-16 MED ORDER — GABAPENTIN 300 MG PO CAPS
900.0000 mg | ORAL_CAPSULE | Freq: Two times a day (BID) | ORAL | Status: DC
  Administered 2019-07-17 – 2019-07-18 (×3): 900 mg via ORAL
  Filled 2019-07-16 (×4): qty 3

## 2019-07-16 MED ORDER — POLYETHYLENE GLYCOL 3350 17 G PO PACK: 17.0000 g | PACK | Freq: Every day | ORAL | Status: DC | PRN

## 2019-07-16 MED ORDER — INSULIN ASPART 100 UNIT/ML ~~LOC~~ SOLN: 0.0000 [IU] | Freq: Every day | SUBCUTANEOUS | Status: DC

## 2019-07-16 MED ORDER — MIRTAZAPINE 15 MG PO TABS
7.5000 mg | ORAL_TABLET | Freq: Every day | ORAL | Status: DC
  Administered 2019-07-17: 7.5 mg via ORAL
  Filled 2019-07-16 (×2): qty 1

## 2019-07-16 MED ORDER — HYDROMORPHONE HCL 1 MG/ML IJ SOLN
1.0000 mg | Freq: Once | INTRAMUSCULAR | Status: AC
  Administered 2019-07-16: 1 mg via INTRAVENOUS
  Filled 2019-07-16: qty 1

## 2019-07-16 MED ORDER — ONDANSETRON HCL 4 MG/2ML IJ SOLN
4.0000 mg | Freq: Four times a day (QID) | INTRAMUSCULAR | Status: DC | PRN
  Administered 2019-07-17: 4 mg via INTRAVENOUS
  Filled 2019-07-16: qty 2

## 2019-07-16 MED ORDER — PANTOPRAZOLE SODIUM 40 MG PO TBEC
40.0000 mg | DELAYED_RELEASE_TABLET | Freq: Every evening | ORAL | Status: DC
  Administered 2019-07-17: 40 mg via ORAL
  Filled 2019-07-16 (×2): qty 1

## 2019-07-16 MED ORDER — GLIMEPIRIDE 2 MG PO TABS
2.0000 mg | ORAL_TABLET | Freq: Two times a day (BID) | ORAL | Status: DC
  Filled 2019-07-16 (×3): qty 1

## 2019-07-16 MED ORDER — RIVAROXABAN 20 MG PO TABS
20.0000 mg | ORAL_TABLET | Freq: Every day | ORAL | Status: DC
  Filled 2019-07-16: qty 1

## 2019-07-16 MED ORDER — ONDANSETRON HCL 4 MG PO TABS: 4.0000 mg | ORAL_TABLET | Freq: Four times a day (QID) | ORAL | Status: DC | PRN

## 2019-07-16 MED ORDER — SODIUM CHLORIDE 0.9 % IV BOLUS
500.0000 mL | Freq: Once | INTRAVENOUS | Status: AC
  Administered 2019-07-16: 500 mL via INTRAVENOUS

## 2019-07-16 MED ORDER — DRONABINOL 5 MG PO CAPS
5.0000 mg | ORAL_CAPSULE | Freq: Two times a day (BID) | ORAL | Status: DC
  Administered 2019-07-17 – 2019-07-18 (×3): 5 mg via ORAL
  Filled 2019-07-16 (×4): qty 1

## 2019-07-16 NOTE — ED Triage Notes (Signed)
Wife says pt has cancer in lungs, pancreas, and liver.  Reports for the past 3 weeks pt has had r sided abd pain.  Reports worse past few days.    Reports has history of ruptured appendix and reports had a drainage tube placed at that time.  Wife says the tube has never been taken out.  Pt was supposed to get chemo last week but couldn't receive it because his liver enzymes were too hight according to wife.

## 2019-07-16 NOTE — ED Provider Notes (Signed)
Emergency Department Provider Note   I have reviewed the triage vital signs and the nursing notes.   HISTORY  Chief Complaint Abdominal Pain   HPI Charles [name] is a 69 y.o. male with PMH of metastatic rectal cancer followed by Dr. [name] presents to the emergency department with severe right lower quadrant abdominal pain.  Pain radiates up the flank and into the right chest.  He states it feels like a squeezing type pain.  He does feel some dyspnea without fever or shaking chills.  He completed his fourth cycle of chemotherapy 3 weeks prior and is due to start his fifth soon.  He denies any UTI symptoms such as dysuria, hesitancy, urgency.  No prior history of kidney stone.  He does have history of ruptured appendicitis which required drain placement which was ultimately removed.  Patient does have a colostomy but continues to have output.  Appetite is decreased. Pain is severe with no modifying factors. He has been taking Oxycodone 10-325 mg tabs without relief.   Past Medical History:  Diagnosis Date  . Chronic anticoagulation   . Colon cancer ([place]) 07/24/2010   rectal ca, inv adenocarcinoma  . Depression 04/01/2011  . Diabetes mellitus without complication ([place])   . DVT (deep venous thrombosis) ([place]) 05/09/2011  . GERD (gastroesophageal reflux disease)   . High output ileostomy ([place]) 05/21/2011  . History of kidney stones   . HTN (hypertension)   . Hx of radiation therapy 09/02/10 to 10/14/10   pelvis  . Lung metastasis ([place])   . Neuropathy   . Peripheral vascular disease ([place])    dvt's,pe  . Pneumonia    hx x3  . Pulmonary embolism ([place])   . Rectal cancer ([place]) 01/15/2011   S/P radiation and concurrent 5-FU continuous infusion from 09/09/10- 10/10/10.  S/P proctectomy with colorectal anastomosis and diverting loop ileostomy on 11/14/10 at [facility] by Dr. Harlon Ditty. Pathology reveals a pT3b N1 with 3/20 lymph nodes.      Patient Active Problem List   Diagnosis Date  Noted  . Abdominal pain 07/16/2019  . Elevated bilirubin 07/16/2019  . Nausea, vomiting and diarrhea   . Metastatic colorectal cancer ([place])   . Anastomotic stricture of colorectal region   . Abnormal CT scan, colon   . Diarrhea in adult patient 09/22/2018  . History of pulmonary embolism 09/22/2018  . Hypokalemia 09/22/2018  . Type 2 diabetes mellitus ([place]) 09/22/2018  . Hypomagnesemia 09/22/2018  . Dilated gallbladder 09/22/2018  . Goals of care, counseling/discussion 09/20/2018  . Fecal incontinence 09/23/2017  . Rectal pain 05/30/2016  . Hiatal hernia   . Schatzki's ring   . GI bleed 11/23/2014  . Gastritis 11/23/2014  . S/P thoracotomy 11/23/2014  . S/P partial lobectomy of lung 11/15/2014  . DVT (deep venous thrombosis) ([place])   . Lung nodule   . Lung nodule, solitary   . Lower abdominal pain 09/08/2014  . Urinary retention 09/08/2014  . Diarrhea 08/01/2014  . 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 Stiffness of joint, not elsewhere classified, ankle and foot 01/31/2014  . Weakness of both legs 01/31/2014  . Hx of radiation therapy   . Dehydration 03/10/2011  . Rectal cancer ([place]) 01/15/2011  . Colon cancer ([place]) 07/24/2010  . GERD 07/11/2010    Past Surgical History:  Procedure Laterality Date  . BILIARY STENT PLACEMENT N/A 09/16/2018   Procedure: STENT PLACEMENT;  Surgeon: Rogene Houston, MD;  [facility];  Service:  Endoscopy;  Laterality: N/A;  . BIOPSY  09/24/2018   Procedure: BIOPSY;  Surgeon: Danie Binder, MD;  [facility];  Service: Endoscopy;;  . COLON SURGERY  11/14/2010   proctectomy with colorectal anastomosis and diverting loop ileostomy (temporary planned)  . COLONOSCOPY  07/2010   proximal rectal apple core mass 10-14cm from anal verge (adenocarcinoma), 2-3cm distal rectal carpet polyp s/p piecemeal snare polypectomy (adenoma)  . COLONOSCOPY  04/21/2012   RMR: Friable,fibrotic appearing colorectal  anastomosis producing some luminal narrowing-not felt to be critical. path: focal erosion with slight inflammation and hyperemia. SURVEILLANCE DUE DEC 2015  . COLONOSCOPY N/A 11/24/2013   Dr. Rourk:somewhat fibrotic/friable anastomotic mucosal-status post biopsy (narrowing not felt to be clinically significant) Single colonic diverticulum. benign polypoid rectal mucosa  . colostomy reversal  april 2013  . ERCP N/A 09/16/2018   Procedure: ENDOSCOPIC RETROGRADE CHOLANGIOPANCREATOGRAPHY (ERCP);  Surgeon: Rogene Houston, MD;  [facility];  Service: Endoscopy;  Laterality: N/A;  . ESOPHAGOGASTRODUODENOSCOPY  07/2010   RMR: schatki ring s/p dilation, small hh, SB bx benign  . ESOPHAGOGASTRODUODENOSCOPY N/A 09/04/2015   Procedure: ESOPHAGOGASTRODUODENOSCOPY (EGD);  Surgeon: Daneil Dolin, MD;  [facility];  Service: Endoscopy;  Laterality: N/A;  730  . EUS  08/2010   Dr. Owens Loffler. uT3N0 circumferential, nearly obstruction rectosigmoid adenocarcinoma, distal edge 12cm from anal verge  . EUS N/A 06/19/2016   Procedure: UPPER ENDOSCOPIC ULTRASOUND (EUS) RADIAL;  Surgeon: Milus Banister, MD;  [facility]: [facility];  Service: Endoscopy;  Laterality: N/A;  . FLEXIBLE SIGMOIDOSCOPY N/A 09/24/2018   Procedure: FLEXIBLE SIGMOIDOSCOPY;  Surgeon: Danie Binder, MD;  [facility];  Service: Endoscopy;  Laterality: N/A;  . HERNIA REPAIR     abd hernia repair  . IVC filter    . ivc filter    . port a cath placement    . PORT-A-CATH REMOVAL  09/24/2011   Procedure: REMOVAL PORT-A-CATH;  Surgeon: Donato Heinz, MD;  Location: [facility];  Service: General;  Laterality: N/A;  Minor Room  . PORTACATH PLACEMENT Right 07/07/2016   Procedure: INSERTION PORT-A-CATH;  Surgeon: Aviva Signs, MD;  Location: [facility];  Service: General;  Laterality: Right;  . SPHINCTEROTOMY N/A 09/16/2018   Procedure: SPHINCTEROTOMY with balloon dialation;  Surgeon: Rogene Houston, MD;  [facility];  Service: Endoscopy;  Laterality: N/A;  . TRANSVERSE LOOP COLOSTOMY N/A 09/29/2018   Procedure: TRANSVERSE LOOP COLOSTOMY;  Surgeon: Aviva Signs, MD;  Location: [facility];  Service: General;  Laterality: N/A;  . VIDEO ASSISTED THORACOSCOPY (VATS)/WEDGE RESECTION Right 11/15/2014   Procedure: VIDEO ASSISTED THORACOSCOPY (VATS)/LUNG RESECTION WITH RIGHT LINGULECTOMY;  Surgeon: [name], MD;  Location: [place];  Service: Thoracic;  Laterality: Right;  Marland Kitchen VIDEO BRONCHOSCOPY N/A 11/15/2014   Procedure: VIDEO BRONCHOSCOPY;  Surgeon: [name], MD;  Location: [facility] OR;  Service: Thoracic;  Laterality: N/A;    Allergies Oxycodone, Tramadol, and Trazodone and nefazodone  Family History  Problem Relation Age of Onset  . Cancer Brother        throat  . Cancer Brother        prostate  . Colon cancer Neg Hx   . Liver disease Neg Hx   . Inflammatory bowel disease Neg Hx     Social History Social History   Tobacco Use  . Smoking status: Never Smoker  . Smokeless tobacco: Never Used  Substance Use Topics  . Alcohol use: No  .  Drug use: No    Review of Systems  Constitutional: No fever/chills Eyes: No visual changes. ENT: No sore throat. Cardiovascular: Positive chest pain. Respiratory: Denies shortness of breath. Gastrointestinal: Positive right abdominal pain. Positive nausea, no vomiting. Normal ostomy output.  Genitourinary: Negative for dysuria. Musculoskeletal: Positive right back/flank pain.  Skin: Negative for rash. Neurological: Negative for headaches, focal weakness or numbness.  10-point ROS otherwise negative.  ____________________________________________   PHYSICAL EXAM:  VITAL SIGNS: ED Triage Vitals  Enc Vitals Group     BP 07/16/19 1822 129/86     Pulse Rate 07/16/19 1822 (!) 111     Resp 07/16/19 1822 (!) 30     Temp 07/16/19 1822 98.2 F (36.8 C)     Temp Source 07/16/19 1822 Oral     SpO2 07/16/19 1822 100 %     Weight 07/16/19 1819 164  lb (74.4 kg)     Height 07/16/19 1819 6\' 2"  (1.88 m)   Constitutional: Alert and oriented. Patient appears uncomfortable with frequent shifting in bed.  Eyes: Conjunctivae are normal.  Head: Atraumatic. Nose: No congestion/rhinnorhea. Mouth/Throat: Mucous membranes are moist.  Neck: No stridor.  Cardiovascular: Tachycardia. Good peripheral circulation. Grossly normal heart sounds.   Respiratory: Normal respiratory effort.  No retractions. Lungs CTAB. Gastrointestinal: Soft with diffuse tenderness but no focal peritonitis. No distention.  Musculoskeletal: No gross deformities of extremities. Neurologic:  Normal speech and language. Skin:  Skin is warm, dry and intact. No rash noted.  ____________________________________________   LABS (all labs ordered are listed, but only abnormal results are displayed)  Labs Reviewed  COMPREHENSIVE METABOLIC PANEL - Abnormal; Notable for the following components:      Result Value   Sodium 133 (*)    Glucose, Bld 183 (*)    Calcium 8.3 (*)    Total Protein 5.6 (*)    Albumin 2.9 (*)    AST 276 (*)    ALT 293 (*)    Alkaline Phosphatase 596 (*)    Total Bilirubin 4.8 (*)    All other components within normal limits  CBC WITH DIFFERENTIAL/PLATELET - Abnormal; Notable for the following components:   WBC 13.4 (*)    RBC 3.98 (*)    Hemoglobin 11.2 (*)    HCT 34.6 (*)    RDW 17.2 (*)    Neutro Abs 10.8 (*)    Monocytes Absolute 1.3 (*)    Abs Immature Granulocytes 0.08 (*)    All other components within normal limits  URINE CULTURE  SARS CORONAVIRUS 2 (TAT 6-24 HRS)  LIPASE, BLOOD  URINALYSIS, ROUTINE W REFLEX MICROSCOPIC  CBC  COMPREHENSIVE METABOLIC PANEL  HEMOGLOBIN A1C  TROPONIN I (HIGH SENSITIVITY)  TROPONIN I (HIGH SENSITIVITY)   ____________________________________________  EKG   EKG Interpretation  Date/Time:  Saturday July 16 2019 18:53:07 EST Ventricular Rate:  103 PR Interval:    QRS Duration: 87 QT  Interval:  311 QTC Calculation: 407 R Axis:   70 Text Interpretation: Sinus tachycardia Multiple ventricular premature complexes No STEMI Confirmed by Nanda Quinton [phone]) on 07/16/2019 7:00:30 PM       ____________________________________________  RADIOLOGY  CT Angio Chest PE W and/or Wo Contrast  Result Date: 07/16/2019 CLINICAL DATA:  69 year old male with right lower quadrant abdominal pain radiating to the flank area as well as chest pain. History of metastatic rectal cancer. EXAM: CT ANGIOGRAPHY CHEST CT ABDOMEN AND PELVIS WITH CONTRAST TECHNIQUE: Multidetector CT imaging of the chest was performed using the standard protocol  during bolus administration of intravenous contrast. Multiplanar CT image reconstructions and MIPs were obtained to evaluate the vascular anatomy. Multidetector CT imaging of the abdomen and pelvis was performed using the standard protocol during bolus administration of intravenous contrast. CONTRAST:  15mL OMNIPAQUE IOHEXOL 350 MG/ML SOLN COMPARISON:  CT of the chest abdomen pelvis dated 04/19/2019. FINDINGS: CTA CHEST FINDINGS Cardiovascular: There is no cardiomegaly or pericardial effusion. There is 3 vessel coronary vascular calcification. Mild atherosclerotic calcification of the thoracic aorta. No aneurysmal dilatation or dissection. No pulmonary artery embolus identified. Right-sided Port-A-Cath with tip in the region of the cavoatrial junction. Mediastinum/Nodes: Mildly enlarged right hilar lymph node measures 11 mm similar to prior CT. No new adenopathy. The esophagus and the thyroid gland are grossly unremarkable. No mediastinal fluid collection. Lungs/Pleura: There is a 1 cm right upper lobe nodule along the major fissure (series 7, image 65), similar to prior CT. Additional right upper lobe subpleural nodule (series 7, image 34) measures approximately 13 mm (previously 11 mm). A 4 mm nodule at the right lung base (series 7, image 118) previously measured 2 mm. A  5 mm nodule at the right lung base (series 7, image 125) previously measured 2 mm. There is an area of scarring in the right middle lobe inferiorly. There is a small right pleural effusion which has increased in size since the prior CT. There is no pneumothorax. The central airways are patent. Musculoskeletal: There has been increase in the size of the mixed lytic and sclerotic metastatic disease involving the posterior right eleventh rib. There is degenerative changes of the spine. There is mixed lytic and sclerotic metastasis involving the sternum. Review of the MIP images confirms the above findings. CT ABDOMEN and PELVIS FINDINGS No intra-abdominal free air or free fluid. Hepatobiliary: Probable background of fatty infiltration of the liver. There has been interval increase in the size of the hypoenhancing lesion in the inferior right lobe of the liver (series 2, image 25) now measuring up to 2.1 cm in greatest dimension (previously approximately 7 mm). Several additional smaller hypodense lesions in segment VII appear new since the prior CT. The gallbladder is unremarkable. There is dilatation of the intrahepatic biliary tree, progressed since the prior CT. There is also dilatation of the common bile duct. The common bile duct measures approximately 10 mm in diameter, previously measured approximately 8 mm. Pancreas: There is a 3.5 x 2.4 cm hypoenhancing mass in the head/uncinate process of the pancreas (previously measuring approximately 3.8 x 2.1 cm). There is complete atrophy of the gland with dilatation of the main pancreatic duct as seen previously. Spleen: Normal in size without focal abnormality. Adrenals/Urinary Tract: There is calcification of the medial limb of the left adrenal gland, likely sequela of prior insult. The right adrenal gland is unremarkable. There is no hydronephrosis on either side. There is symmetric enhancement and excretion of contrast by both kidneys. The visualized ureters and  urinary bladder appear unremarkable. Stomach/Bowel: Postsurgical changes of sigmoid resection with anastomosis. There is a left anterior abdominal loop colostomy. Anastomotic suture is also noted in the right lower quadrant. There is mildly dilated and fecalized loop of bowel in the right hemipelvis which may represent a degree of obstruction. The appendix is normal. Vascular/Lymphatic: Mild aortoiliac atherosclerotic disease. An infrarenal IVC filter is noted. The SMV, splenic vein, and main portal vein are patent. No portal venous gas. Mildly enlarged portacaval lymph node measures approximately 11 mm in short axis (previously 7 mm). Additional mildly enlarged gastrohepatic  lymph node measures approximately 11 mm in short axis. Reproductive: The prostate and seminal vesicles are grossly unremarkable. Other: None Musculoskeletal: There is degenerative changes of the spine. No acute osseous pathology. Review of the MIP images confirms the above findings. IMPRESSION: 1. No CT evidence of pulmonary artery embolus. 2. Mildly dilated and fecalized small bowel loop in the pelvis may represent a partial or early obstruction. Clinical correlation and follow-up recommended. 3. Overall progression of disease with progression of pulmonary and hepatic metastatic lesions. 4. Small right pleural effusion has increased in size since the prior CT. 5. No significant interval change in the size of the soft tissue mass in the head/uncinate process of the pancreas. 6. Worsened biliary ductal dilatation compared to the prior CT. 7. Interval increase in the size of the mixed lytic and sclerotic lesion involving the posterior right eleventh rib. 8. Aortic Atherosclerosis (ICD10-I70.0). Electronically Signed   By: Anner Crete M.D.   On: 07/16/2019 20:07   CT ABDOMEN PELVIS W CONTRAST  Result Date: 07/16/2019 CLINICAL DATA:  69 year old male with right lower quadrant abdominal pain radiating to the flank area as well as chest  pain. History of metastatic rectal cancer. EXAM: CT ANGIOGRAPHY CHEST CT ABDOMEN AND PELVIS WITH CONTRAST TECHNIQUE: Multidetector CT imaging of the chest was performed using the standard protocol during bolus administration of intravenous contrast. Multiplanar CT image reconstructions and MIPs were obtained to evaluate the vascular anatomy. Multidetector CT imaging of the abdomen and pelvis was performed using the standard protocol during bolus administration of intravenous contrast. CONTRAST:  178mL OMNIPAQUE IOHEXOL 350 MG/ML SOLN COMPARISON:  CT of the chest abdomen pelvis dated 04/19/2019. FINDINGS: CTA CHEST FINDINGS Cardiovascular: There is no cardiomegaly or pericardial effusion. There is 3 vessel coronary vascular calcification. Mild atherosclerotic calcification of the thoracic aorta. No aneurysmal dilatation or dissection. No pulmonary artery embolus identified. Right-sided Port-A-Cath with tip in the region of the cavoatrial junction. Mediastinum/Nodes: Mildly enlarged right hilar lymph node measures 11 mm similar to prior CT. No new adenopathy. The esophagus and the thyroid gland are grossly unremarkable. No mediastinal fluid collection. Lungs/Pleura: There is a 1 cm right upper lobe nodule along the major fissure (series 7, image 65), similar to prior CT. Additional right upper lobe subpleural nodule (series 7, image 34) measures approximately 13 mm (previously 11 mm). A 4 mm nodule at the right lung base (series 7, image 118) previously measured 2 mm. A 5 mm nodule at the right lung base (series 7, image 125) previously measured 2 mm. There is an area of scarring in the right middle lobe inferiorly. There is a small right pleural effusion which has increased in size since the prior CT. There is no pneumothorax. The central airways are patent. Musculoskeletal: There has been increase in the size of the mixed lytic and sclerotic metastatic disease involving the posterior right eleventh rib. There is  degenerative changes of the spine. There is mixed lytic and sclerotic metastasis involving the sternum. Review of the MIP images confirms the above findings. CT ABDOMEN and PELVIS FINDINGS No intra-abdominal free air or free fluid. Hepatobiliary: Probable background of fatty infiltration of the liver. There has been interval increase in the size of the hypoenhancing lesion in the inferior right lobe of the liver (series 2, image 25) now measuring up to 2.1 cm in greatest dimension (previously approximately 7 mm). Several additional smaller hypodense lesions in segment VII appear new since the prior CT. The gallbladder is unremarkable. There is dilatation of  the intrahepatic biliary tree, progressed since the prior CT. There is also dilatation of the common bile duct. The common bile duct measures approximately 10 mm in diameter, previously measured approximately 8 mm. Pancreas: There is a 3.5 x 2.4 cm hypoenhancing mass in the head/uncinate process of the pancreas (previously measuring approximately 3.8 x 2.1 cm). There is complete atrophy of the gland with dilatation of the main pancreatic duct as seen previously. Spleen: Normal in size without focal abnormality. Adrenals/Urinary Tract: There is calcification of the medial limb of the left adrenal gland, likely sequela of prior insult. The right adrenal gland is unremarkable. There is no hydronephrosis on either side. There is symmetric enhancement and excretion of contrast by both kidneys. The visualized ureters and urinary bladder appear unremarkable. Stomach/Bowel: Postsurgical changes of sigmoid resection with anastomosis. There is a left anterior abdominal loop colostomy. Anastomotic suture is also noted in the right lower quadrant. There is mildly dilated and fecalized loop of bowel in the right hemipelvis which may represent a degree of obstruction. The appendix is normal. Vascular/Lymphatic: Mild aortoiliac atherosclerotic disease. An infrarenal IVC filter  is noted. The SMV, splenic vein, and main portal vein are patent. No portal venous gas. Mildly enlarged portacaval lymph node measures approximately 11 mm in short axis (previously 7 mm). Additional mildly enlarged gastrohepatic lymph node measures approximately 11 mm in short axis. Reproductive: The prostate and seminal vesicles are grossly unremarkable. Other: None Musculoskeletal: There is degenerative changes of the spine. No acute osseous pathology. Review of the MIP images confirms the above findings. IMPRESSION: 1. No CT evidence of pulmonary artery embolus. 2. Mildly dilated and fecalized small bowel loop in the pelvis may represent a partial or early obstruction. Clinical correlation and follow-up recommended. 3. Overall progression of disease with progression of pulmonary and hepatic metastatic lesions. 4. Small right pleural effusion has increased in size since the prior CT. 5. No significant interval change in the size of the soft tissue mass in the head/uncinate process of the pancreas. 6. Worsened biliary ductal dilatation compared to the prior CT. 7. Interval increase in the size of the mixed lytic and sclerotic lesion involving the posterior right eleventh rib. 8. Aortic Atherosclerosis (ICD10-I70.0). Electronically Signed   By: Anner Crete M.D.   On: 07/16/2019 20:07   DG Chest Portable 1 View  Result Date: 07/16/2019 CLINICAL DATA:  Chest pain, rectal cancer EXAM: PORTABLE CHEST 1 VIEW COMPARISON:  11/25/2018 chest radiograph. FINDINGS: Right subclavian Port-A-Cath terminates in the lower third of the SVC. Stable cardiomediastinal silhouette with normal heart size. No pneumothorax. No pleural effusion. Pulmonary nodules scattered in the mid and upper right lung, not definitely changed. No pulmonary edema. No acute consolidative airspace disease. IMPRESSION: 1. No acute cardiopulmonary disease. 2. Right pulmonary nodules, not definitely changed by chest radiographs. Electronically Signed    By: Ilona Sorrel M.D.   On: 07/16/2019 19:32    ____________________________________________   PROCEDURES  Procedure(s) performed:   Procedures  None  ____________________________________________   INITIAL IMPRESSION / ASSESSMENT AND PLAN / ED COURSE  Pertinent labs & imaging results that were available during my care of the patient were reviewed by me and considered in my medical decision making (see chart for details).   Patient presents emergency department evaluation of primarily right-sided abdominal pain worsening over the past 2 weeks gradually but became severe over the past 2 days.  Patient is afebrile.  He appears uncomfortable with diffuse tenderness on exam.  Patient will require CT  abdomen pelvis.  Some of the pain radiates up into the right chest.  Given his tachycardia, dyspnea, cancer history he is at risk for PE and my suspicion is elevated.  Plan also for initial plain film of the chest followed by CTA PE protocol while treating pain. Ureterolithiasis is also a possibility.   CTA and CT abdomen/pelvis reviewed.  Several possible explanations with patient's pain.  He has fecalization of the small bowel loop with question of early/partial obstruction.  Discussed the case with Dr. Constance Haw.  Likely slow transit.  Plan for ultrasound in the morning to assess the patient's biliary system given elevated bilirubin and slight elevation in LFTs.   Discussed patient's case with [facility], Dr. Nehemiah Settle to request admission. Patient and family (if present) updated with plan. Care transferred to [facility] service.  I reviewed all nursing notes, vitals, pertinent old records, EKGs, labs, imaging (as available).  ____________________________________________  FINAL CLINICAL IMPRESSION(S) / ED DIAGNOSES  Final diagnoses:  Right lower quadrant abdominal pain  Hyperbilirubinemia  Elevated LFTs     MEDICATIONS GIVEN DURING THIS VISIT:  Medications  HYDROmorphone (DILAUDID) injection 1 mg (1  mg Intravenous Not Given 07/16/19 1901)  0.9 %  sodium chloride infusion (has no administration in time range)  oxyCODONE-acetaminophen (PERCOCET) 10-325 MG per tablet 1 tablet (has no administration in time range)  mirtazapine (REMERON) tablet 7.5 mg (has no administration in time range)  glimepiride (AMARYL) tablet 2 mg (has no administration in time range)  dronabinol (MARINOL) capsule 5 mg (has no administration in time range)  pantoprazole (PROTONIX) EC tablet 40 mg (has no administration in time range)  rivaroxaban (XARELTO) tablet 20 mg (has no administration in time range)  clonazePAM (KLONOPIN) tablet 1 mg (has no administration in time range)  gabapentin (NEURONTIN) capsule 900 mg (has no administration in time range)  ondansetron (ZOFRAN) tablet 4 mg (has no administration in time range)    Or  ondansetron (ZOFRAN) injection 4 mg (has no administration in time range)  insulin aspart (novoLOG) injection 0-15 Units (has no administration in time range)  insulin aspart (novoLOG) injection 0-5 Units (has no administration in time range)  polyethylene glycol (MIRALAX / GLYCOLAX) packet 17 g (has no administration in time range)  polyethylene glycol (MIRALAX / GLYCOLAX) packet 17 g (has no administration in time range)  ondansetron (ZOFRAN) injection 4 mg (4 mg Intravenous Given 07/16/19 1901)  sodium chloride 0.9 % bolus 500 mL (0 mLs Intravenous Stopped 07/16/19 2116)  HYDROmorphone (DILAUDID) injection 1 mg (1 mg Intravenous Given 07/16/19 1909)  iohexol (OMNIPAQUE) 350 MG/ML injection 100 mL (100 mLs Intravenous Contrast Given 07/16/19 2000)    Note:  This document was prepared using Dragon voice recognition software and may include unintentional dictation errors.  Nanda Quinton, MD, [facility]    Aerin Delany, [facility], MD 07/16/19 2318

## 2019-07-16 NOTE — H&P (Signed)
History and Physical  Charles [name] PPI:[id number] DOB: May 04, 1951 DOA: 07/16/2019  Referring physician: Dr Laverta Baltimore, ED physician PCP: [name], MD  Outpatient Specialists:   Patient Coming From: home  Chief Complaint: abdominal pain  HPI: Charles [name] is a 69 y.o. male with a history of pulmonary embolism on chronic anticoagulation, diabetes, GERD, hypertension, rectal cancer status post proctostomy and transverse loop colostomy with diverting ileostomy.  Patient is also currently undergoing chemotherapy.  Patient presents with severe right lower quadrant abdominal pain over the past 3 days, which has been increasing.  He defines the pain as severe squeezing and fairly constant.  Mild improvement with oral pain medicines at home, but dramatically improved with IV Dilaudid.  He does describe variable stool output in his ostomy: Some more solid stool recently.  He is not on any stool softeners.  He has had nausea and dry heaves, but no vomiting and appetite has been decreased.  No other palliating or provoking factors.  Symptoms are worsening  Emergency Department Course: CT abdomen shows mildly dilated small bowel loop and worsened bile duct dilation.  Additionally, he does have increased metastatic disease.  Labs show elevated LFTs and an elevated bilirubin of 4.8.  This is increased from labs 5 days ago.  White count is 13.4  Review of Systems:   Pt denies any fevers, chills, vomiting, diarrhea, constipation, shortness of breath, dyspnea on exertion, orthopnea, cough, wheezing, palpitations, headache, vision changes, lightheadedness, dizziness, melena, rectal bleeding.  Review of systems are otherwise negative  Past Medical History:  Diagnosis Date  . Chronic anticoagulation   . Colon cancer ([place]) 07/24/2010   rectal ca, inv adenocarcinoma  . Depression 04/01/2011  . Diabetes mellitus without complication ([place])   . DVT (deep venous thrombosis) ([place]) 05/09/2011  . GERD  (gastroesophageal reflux disease)   . High output ileostomy ([place]) 05/21/2011  . History of kidney stones   . HTN (hypertension)   . Hx of radiation therapy 09/02/10 to 10/14/10   pelvis  . Lung metastasis ([place])   . Neuropathy   . Peripheral vascular disease ([place])    dvt's,pe  . Pneumonia    hx x3  . Pulmonary embolism ([place])   . Rectal cancer ([place]) 01/15/2011   S/P radiation and concurrent 5-FU continuous infusion from 09/09/10- 10/10/10.  S/P proctectomy with colorectal anastomosis and diverting loop ileostomy on 11/14/10 at [facility] by Dr. Harlon Ditty. Pathology reveals a pT3b N1 with 3/20 lymph nodes.     Past Surgical History:  Procedure Laterality Date  . BILIARY STENT PLACEMENT N/A 09/16/2018   Procedure: STENT PLACEMENT;  Surgeon: Rogene Houston, MD;  [facility];  Service: Endoscopy;  Laterality: N/A;  . BIOPSY  09/24/2018   Procedure: BIOPSY;  Surgeon: Danie Binder, MD;  [facility];  Service: Endoscopy;;  . COLON SURGERY  11/14/2010   proctectomy with colorectal anastomosis and diverting loop ileostomy (temporary planned)  . COLONOSCOPY  07/2010   proximal rectal apple core mass 10-14cm from anal verge (adenocarcinoma), 2-3cm distal rectal carpet polyp s/p piecemeal snare polypectomy (adenoma)  . COLONOSCOPY  04/21/2012   RMR: Friable,fibrotic appearing colorectal anastomosis producing some luminal narrowing-not felt to be critical. path: focal erosion with slight inflammation and hyperemia. SURVEILLANCE DUE DEC 2015  . COLONOSCOPY N/A 11/24/2013   Dr. Rourk:somewhat fibrotic/friable anastomotic mucosal-status post biopsy (narrowing not felt to be clinically significant) Single colonic diverticulum. benign polypoid rectal mucosa  . colostomy reversal  april  2013  . ERCP N/A 09/16/2018   Procedure: ENDOSCOPIC RETROGRADE CHOLANGIOPANCREATOGRAPHY (ERCP);  Surgeon: Rogene Houston, MD;  [facility];  Service: Endoscopy;  Laterality: N/A;  .  ESOPHAGOGASTRODUODENOSCOPY  07/2010   RMR: schatki ring s/p dilation, small hh, SB bx benign  . ESOPHAGOGASTRODUODENOSCOPY N/A 09/04/2015   Procedure: ESOPHAGOGASTRODUODENOSCOPY (EGD);  Surgeon: Daneil Dolin, MD;  [facility];  Service: Endoscopy;  Laterality: N/A;  730  . EUS  08/2010   Dr. Owens Loffler. uT3N0 circumferential, nearly obstruction rectosigmoid adenocarcinoma, distal edge 12cm from anal verge  . EUS N/A 06/19/2016   Procedure: UPPER ENDOSCOPIC ULTRASOUND (EUS) RADIAL;  Surgeon: Milus Banister, MD;  Location: [facility];  Service: Endoscopy;  Laterality: N/A;  . FLEXIBLE SIGMOIDOSCOPY N/A 09/24/2018   Procedure: FLEXIBLE SIGMOIDOSCOPY;  Surgeon: Danie Binder, MD;  [facility];  Service: Endoscopy;  Laterality: N/A;  . HERNIA REPAIR     abd hernia repair  . IVC filter    . ivc filter    . port a cath placement    . PORT-A-CATH REMOVAL  09/24/2011   Procedure: REMOVAL PORT-A-CATH;  Surgeon: Donato Heinz, MD;  Location: [facility];  Service: General;  Laterality: N/A;  Minor Room  . PORTACATH PLACEMENT Right 07/07/2016   Procedure: INSERTION PORT-A-CATH;  Surgeon: Aviva Signs, MD;  Location: [facility];  Service: General;  Laterality: Right;  . SPHINCTEROTOMY N/A 09/16/2018   Procedure: SPHINCTEROTOMY with balloon dialation;  Surgeon: Rogene Houston, MD;  [facility];  Service: Endoscopy;  Laterality: N/A;  . TRANSVERSE LOOP COLOSTOMY N/A 09/29/2018   Procedure: TRANSVERSE LOOP COLOSTOMY;  Surgeon: Aviva Signs, MD;  Location: [facility];  Service: General;  Laterality: N/A;  . VIDEO ASSISTED THORACOSCOPY (VATS)/WEDGE RESECTION Right 11/15/2014   Procedure: VIDEO ASSISTED THORACOSCOPY (VATS)/LUNG RESECTION WITH RIGHT LINGULECTOMY;  Surgeon: [name], MD;  Location: [place];  Service: Thoracic;  Laterality: Right;  Marland Kitchen VIDEO BRONCHOSCOPY N/A 11/15/2014   Procedure: VIDEO BRONCHOSCOPY;  Surgeon: [name], MD;  Location: [place];  Service:  Thoracic;  Laterality: N/A;   Social History:  reports that he has never smoked. He has never used smokeless tobacco. He reports that he does not drink alcohol or use drugs. Patient lives at home  Allergies  Allergen Reactions  . Oxycodone     Blisters, hallucinations  . Tramadol     Blisters, hallucinations  . Trazodone And Nefazodone Other (See Comments)    hallucinations    Family History  Problem Relation Age of Onset  . Cancer Brother        throat  . Cancer Brother        prostate  . Colon cancer Neg Hx   . Liver disease Neg Hx   . Inflammatory bowel disease Neg Hx       Prior to Admission medications   Medication Sig Start Date End Date Taking? Authorizing Provider  acetaminophen (TYLENOL) 500 MG tablet Take 1,000 mg by mouth every 6 (six) hours as needed for mild pain or moderate pain.     [provider]  clonazePAM (KLONOPIN) 1 MG tablet TAKE 1 TABLET BY MOUTH THREE TIMES DAILY AS NEEDED 06/28/19   Lockamy, Randi L, NP-C  Diphenhyd-Hydrocort-Nystatin (FIRST-DUKES MOUTHWASH) SUSP Use as directed 5 mLs in the mouth or throat 4 (four) times daily. Patient not taking: Reported on 07/11/2019 10/07/18   Derek Jack, MD  dronabinol (MARINOL) 5 MG capsule Take 1  capsule (5 mg total) by mouth 2 (two) times daily before a meal. 04/20/19   Lockamy, Randi L, NP-C  gabapentin (NEURONTIN) 300 MG capsule Take 3 capsules (900 mg total) by mouth 2 (two) times daily. 06/10/19   Lockamy, Randi L, NP-C  glimepiride (AMARYL) 2 MG tablet Take 2 mg by mouth 2 (two) times daily before a meal.     [provider]  magnesium oxide (MAG-OX) 400 (241.3 Mg) MG tablet Take 1 tablet (400 mg total) by mouth 2 (two) times daily. 05/11/19   Derek Jack, MD  mirtazapine (REMERON) 15 MG tablet Take 7.5 mg by mouth at bedtime. 05/20/19   [provider]  ondansetron (ZOFRAN) 4 MG tablet Take 1 tablet (4 mg total) by mouth daily as needed for nausea or vomiting. Patient  not taking: Reported on 07/11/2019 03/13/19 03/12/20  Heath Lark D, DO  oxyCODONE-acetaminophen (PERCOCET) 10-325 MG tablet Take 1 tablet by mouth every 8 (eight) hours as needed for pain. 07/11/19   Derek Jack, MD  pantoprazole (PROTONIX) 40 MG tablet Take 40 mg by mouth every evening.  12/13/18   [provider]  Probiotic Product (PROBIOTIC DAILY PO) Take 1 capsule by mouth 2 (two) times daily.     [provider]  rivaroxaban (XARELTO) 20 MG TABS tablet Take 1 tablet (20 mg total) by mouth daily with supper. 09/19/18   Rehman, Mechele Dawley, MD  vitamin B-12 (CYANOCOBALAMIN) 1000 MCG tablet Take 1,000 mcg by mouth daily.    [provider]    Physical Exam: BP 114/74   Pulse (!) 34   Temp 98.2 F (36.8 C) (Oral)   Resp (!) 21   Ht 6\' 2"  (1.88 m)   Wt 74.4 kg   SpO2 99%   BMI 21.06 kg/m   . General: Elderly male. Awake and alert and oriented x3. No acute cardiopulmonary distress.  Marland Kitchen HEENT: Normocephalic atraumatic.  Right and left ears normal in appearance.  Pupils equal, round, reactive to light. Extraocular muscles are intact. Sclerae anicteric and noninjected.  Moist mucosal membranes. No mucosal lesions.  . Neck: Neck supple without lymphadenopathy. No carotid bruits. No masses palpated.  . Cardiovascular: Regular rate with normal S1-S2 sounds. No murmurs, rubs, gallops auscultated. No JVD.  Marland Kitchen Respiratory: Good respiratory effort with no wheezes, rales, rhonchi. Lungs clear to auscultation bilaterally.  No accessory muscle use. . Abdomen: Soft, but tender in the right lower quadrant with no rebound or guarding.  Nondistended.  Diminished bowel sounds.  Ostomy appears normal.  No masses or hepatosplenomegaly  . Skin: No rashes, lesions, or ulcerations.  Dry, warm to touch. 2+ dorsalis pedis and radial pulses. . Musculoskeletal: No calf or leg pain. All major joints not erythematous nontender.  No upper or lower joint deformation.  Good ROM.  No contractures   . Psychiatric: Intact judgment and insight. Pleasant and cooperative. . Neurologic: No focal neurological deficits. Strength is 5/5 and symmetric in upper and lower extremities.  Cranial nerves II through XII are grossly intact.           Labs on Admission: I have personally reviewed following labs and imaging studies  CBC: Recent Labs  Lab 07/11/19 0805 07/16/19 1859  WBC 9.4 13.4*  NEUTROABS 6.6 10.8*  HGB 12.0* 11.2*  HCT 38.3* 34.6*  MCV 89.3 86.9  PLT 317 427   Basic Metabolic Panel: Recent Labs  Lab 07/11/19 0805 07/16/19 1859  NA 137 133*  K 4.2 3.6  CL 104  101  CO2 26 22  GLUCOSE 290* 183*  BUN 8 10  CREATININE 0.86 0.65  CALCIUM 8.3* 8.3*  MG 1.6*  --    GFR: Estimated Creatinine Clearance: 93 mL/min (by C-G formula based on SCr of 0.65 mg/dL). Liver Function Tests: Recent Labs  Lab 07/11/19 0805 07/16/19 1859  AST 242* 276*  ALT 188* 293*  ALKPHOS 391* 596*  BILITOT 1.0 4.8*  PROT 5.4* 5.6*  ALBUMIN 2.9* 2.9*   Recent Labs  Lab 07/16/19 1859  LIPASE 11   No results for input(s): AMMONIA in the last 168 hours. Coagulation Profile: No results for input(s): INR, PROTIME in the last 168 hours. Cardiac Enzymes: No results for input(s): CKTOTAL, CKMB, CKMBINDEX, TROPONINI in the last 168 hours. BNP (last 3 results) No results for input(s): PROBNP in the last 8760 hours. HbA1C: No results for input(s): HGBA1C in the last 72 hours. CBG: No results for input(s): GLUCAP in the last 168 hours. Lipid Profile: No results for input(s): CHOL, HDL, LDLCALC, TRIG, CHOLHDL, LDLDIRECT in the last 72 hours. Thyroid Function Tests: No results for input(s): TSH, T4TOTAL, FREET4, T3FREE, THYROIDAB in the last 72 hours. Anemia Panel: No results for input(s): VITAMINB12, FOLATE, FERRITIN, TIBC, IRON, RETICCTPCT in the last 72 hours. Urine analysis:    Component Value Date/Time   COLORURINE AMBER (A) 06/22/2019 0910   APPEARANCEUR CLEAR 06/22/2019 0910    LABSPEC 1.020 06/22/2019 0910   PHURINE 5.0 06/22/2019 0910   GLUCOSEU NEGATIVE 06/22/2019 0910   HGBUR NEGATIVE 06/22/2019 0910   BILIRUBINUR NEGATIVE 06/22/2019 0910   KETONESUR NEGATIVE 06/22/2019 0910   PROTEINUR NEGATIVE 06/22/2019 0910   UROBILINOGEN 1.0 11/14/2014 1504   NITRITE NEGATIVE 06/22/2019 0910   LEUKOCYTESUR NEGATIVE 06/22/2019 0910   Sepsis Labs: @LABRCNTIP (procalcitonin:4,lacticidven:4) )No results found for this or any previous visit (from the past 240 hour(s)).   Radiological Exams on Admission: CT Angio Chest PE W and/or Wo Contrast  Result Date: 07/16/2019 CLINICAL DATA:  69 year old male with right lower quadrant abdominal pain radiating to the flank area as well as chest pain. History of metastatic rectal cancer. EXAM: CT ANGIOGRAPHY CHEST CT ABDOMEN AND PELVIS WITH CONTRAST TECHNIQUE: Multidetector CT imaging of the chest was performed using the standard protocol during bolus administration of intravenous contrast. Multiplanar CT image reconstructions and MIPs were obtained to evaluate the vascular anatomy. Multidetector CT imaging of the abdomen and pelvis was performed using the standard protocol during bolus administration of intravenous contrast. CONTRAST:  119mL OMNIPAQUE IOHEXOL 350 MG/ML SOLN COMPARISON:  CT of the chest abdomen pelvis dated 04/19/2019. FINDINGS: CTA CHEST FINDINGS Cardiovascular: There is no cardiomegaly or pericardial effusion. There is 3 vessel coronary vascular calcification. Mild atherosclerotic calcification of the thoracic aorta. No aneurysmal dilatation or dissection. No pulmonary artery embolus identified. Right-sided Port-A-Cath with tip in the region of the cavoatrial junction. Mediastinum/Nodes: Mildly enlarged right hilar lymph node measures 11 mm similar to prior CT. No new adenopathy. The esophagus and the thyroid gland are grossly unremarkable. No mediastinal fluid collection. Lungs/Pleura: There is a 1 cm right upper lobe nodule  along the major fissure (series 7, image 65), similar to prior CT. Additional right upper lobe subpleural nodule (series 7, image 34) measures approximately 13 mm (previously 11 mm). A 4 mm nodule at the right lung base (series 7, image 118) previously measured 2 mm. A 5 mm nodule at the right lung base (series 7, image 125) previously measured 2 mm. There is an area of scarring in  the right middle lobe inferiorly. There is a small right pleural effusion which has increased in size since the prior CT. There is no pneumothorax. The central airways are patent. Musculoskeletal: There has been increase in the size of the mixed lytic and sclerotic metastatic disease involving the posterior right eleventh rib. There is degenerative changes of the spine. There is mixed lytic and sclerotic metastasis involving the sternum. Review of the MIP images confirms the above findings. CT ABDOMEN and PELVIS FINDINGS No intra-abdominal free air or free fluid. Hepatobiliary: Probable background of fatty infiltration of the liver. There has been interval increase in the size of the hypoenhancing lesion in the inferior right lobe of the liver (series 2, image 25) now measuring up to 2.1 cm in greatest dimension (previously approximately 7 mm). Several additional smaller hypodense lesions in segment VII appear new since the prior CT. The gallbladder is unremarkable. There is dilatation of the intrahepatic biliary tree, progressed since the prior CT. There is also dilatation of the common bile duct. The common bile duct measures approximately 10 mm in diameter, previously measured approximately 8 mm. Pancreas: There is a 3.5 x 2.4 cm hypoenhancing mass in the head/uncinate process of the pancreas (previously measuring approximately 3.8 x 2.1 cm). There is complete atrophy of the gland with dilatation of the main pancreatic duct as seen previously. Spleen: Normal in size without focal abnormality. Adrenals/Urinary Tract: There is  calcification of the medial limb of the left adrenal gland, likely sequela of prior insult. The right adrenal gland is unremarkable. There is no hydronephrosis on either side. There is symmetric enhancement and excretion of contrast by both kidneys. The visualized ureters and urinary bladder appear unremarkable. Stomach/Bowel: Postsurgical changes of sigmoid resection with anastomosis. There is a left anterior abdominal loop colostomy. Anastomotic suture is also noted in the right lower quadrant. There is mildly dilated and fecalized loop of bowel in the right hemipelvis which may represent a degree of obstruction. The appendix is normal. Vascular/Lymphatic: Mild aortoiliac atherosclerotic disease. An infrarenal IVC filter is noted. The SMV, splenic vein, and main portal vein are patent. No portal venous gas. Mildly enlarged portacaval lymph node measures approximately 11 mm in short axis (previously 7 mm). Additional mildly enlarged gastrohepatic lymph node measures approximately 11 mm in short axis. Reproductive: The prostate and seminal vesicles are grossly unremarkable. Other: None Musculoskeletal: There is degenerative changes of the spine. No acute osseous pathology. Review of the MIP images confirms the above findings. IMPRESSION: 1. No CT evidence of pulmonary artery embolus. 2. Mildly dilated and fecalized small bowel loop in the pelvis may represent a partial or early obstruction. Clinical correlation and follow-up recommended. 3. Overall progression of disease with progression of pulmonary and hepatic metastatic lesions. 4. Small right pleural effusion has increased in size since the prior CT. 5. No significant interval change in the size of the soft tissue mass in the head/uncinate process of the pancreas. 6. Worsened biliary ductal dilatation compared to the prior CT. 7. Interval increase in the size of the mixed lytic and sclerotic lesion involving the posterior right eleventh rib. 8. Aortic  Atherosclerosis (ICD10-I70.0). Electronically Signed   By: Anner Crete M.D.   On: 07/16/2019 20:07   CT ABDOMEN PELVIS W CONTRAST  Result Date: 07/16/2019 CLINICAL DATA:  69 year old male with right lower quadrant abdominal pain radiating to the flank area as well as chest pain. History of metastatic rectal cancer. EXAM: CT ANGIOGRAPHY CHEST CT ABDOMEN AND PELVIS WITH CONTRAST TECHNIQUE:  Multidetector CT imaging of the chest was performed using the standard protocol during bolus administration of intravenous contrast. Multiplanar CT image reconstructions and MIPs were obtained to evaluate the vascular anatomy. Multidetector CT imaging of the abdomen and pelvis was performed using the standard protocol during bolus administration of intravenous contrast. CONTRAST:  115mL OMNIPAQUE IOHEXOL 350 MG/ML SOLN COMPARISON:  CT of the chest abdomen pelvis dated 04/19/2019. FINDINGS: CTA CHEST FINDINGS Cardiovascular: There is no cardiomegaly or pericardial effusion. There is 3 vessel coronary vascular calcification. Mild atherosclerotic calcification of the thoracic aorta. No aneurysmal dilatation or dissection. No pulmonary artery embolus identified. Right-sided Port-A-Cath with tip in the region of the cavoatrial junction. Mediastinum/Nodes: Mildly enlarged right hilar lymph node measures 11 mm similar to prior CT. No new adenopathy. The esophagus and the thyroid gland are grossly unremarkable. No mediastinal fluid collection. Lungs/Pleura: There is a 1 cm right upper lobe nodule along the major fissure (series 7, image 65), similar to prior CT. Additional right upper lobe subpleural nodule (series 7, image 34) measures approximately 13 mm (previously 11 mm). A 4 mm nodule at the right lung base (series 7, image 118) previously measured 2 mm. A 5 mm nodule at the right lung base (series 7, image 125) previously measured 2 mm. There is an area of scarring in the right middle lobe inferiorly. There is a small right  pleural effusion which has increased in size since the prior CT. There is no pneumothorax. The central airways are patent. Musculoskeletal: There has been increase in the size of the mixed lytic and sclerotic metastatic disease involving the posterior right eleventh rib. There is degenerative changes of the spine. There is mixed lytic and sclerotic metastasis involving the sternum. Review of the MIP images confirms the above findings. CT ABDOMEN and PELVIS FINDINGS No intra-abdominal free air or free fluid. Hepatobiliary: Probable background of fatty infiltration of the liver. There has been interval increase in the size of the hypoenhancing lesion in the inferior right lobe of the liver (series 2, image 25) now measuring up to 2.1 cm in greatest dimension (previously approximately 7 mm). Several additional smaller hypodense lesions in segment VII appear new since the prior CT. The gallbladder is unremarkable. There is dilatation of the intrahepatic biliary tree, progressed since the prior CT. There is also dilatation of the common bile duct. The common bile duct measures approximately 10 mm in diameter, previously measured approximately 8 mm. Pancreas: There is a 3.5 x 2.4 cm hypoenhancing mass in the head/uncinate process of the pancreas (previously measuring approximately 3.8 x 2.1 cm). There is complete atrophy of the gland with dilatation of the main pancreatic duct as seen previously. Spleen: Normal in size without focal abnormality. Adrenals/Urinary Tract: There is calcification of the medial limb of the left adrenal gland, likely sequela of prior insult. The right adrenal gland is unremarkable. There is no hydronephrosis on either side. There is symmetric enhancement and excretion of contrast by both kidneys. The visualized ureters and urinary bladder appear unremarkable. Stomach/Bowel: Postsurgical changes of sigmoid resection with anastomosis. There is a left anterior abdominal loop colostomy. Anastomotic  suture is also noted in the right lower quadrant. There is mildly dilated and fecalized loop of bowel in the right hemipelvis which may represent a degree of obstruction. The appendix is normal. Vascular/Lymphatic: Mild aortoiliac atherosclerotic disease. An infrarenal IVC filter is noted. The SMV, splenic vein, and main portal vein are patent. No portal venous gas. Mildly enlarged portacaval lymph node measures approximately  11 mm in short axis (previously 7 mm). Additional mildly enlarged gastrohepatic lymph node measures approximately 11 mm in short axis. Reproductive: The prostate and seminal vesicles are grossly unremarkable. Other: None Musculoskeletal: There is degenerative changes of the spine. No acute osseous pathology. Review of the MIP images confirms the above findings. IMPRESSION: 1. No CT evidence of pulmonary artery embolus. 2. Mildly dilated and fecalized small bowel loop in the pelvis may represent a partial or early obstruction. Clinical correlation and follow-up recommended. 3. Overall progression of disease with progression of pulmonary and hepatic metastatic lesions. 4. Small right pleural effusion has increased in size since the prior CT. 5. No significant interval change in the size of the soft tissue mass in the head/uncinate process of the pancreas. 6. Worsened biliary ductal dilatation compared to the prior CT. 7. Interval increase in the size of the mixed lytic and sclerotic lesion involving the posterior right eleventh rib. 8. Aortic Atherosclerosis (ICD10-I70.0). Electronically Signed   By: Anner Crete M.D.   On: 07/16/2019 20:07   DG Chest Portable 1 View  Result Date: 07/16/2019 CLINICAL DATA:  Chest pain, rectal cancer EXAM: PORTABLE CHEST 1 VIEW COMPARISON:  11/25/2018 chest radiograph. FINDINGS: Right subclavian Port-A-Cath terminates in the lower third of the SVC. Stable cardiomediastinal silhouette with normal heart size. No pneumothorax. No pleural effusion. Pulmonary  nodules scattered in the mid and upper right lung, not definitely changed. No pulmonary edema. No acute consolidative airspace disease. IMPRESSION: 1. No acute cardiopulmonary disease. 2. Right pulmonary nodules, not definitely changed by chest radiographs. Electronically Signed   By: Ilona Sorrel M.D.   On: 07/16/2019 19:32    EKG: Independently reviewed.  Sinus tachycardia with PVCs.  No ST changes  Assessment/Plan: Principal Problem:   Abdominal pain Active Problems:   GERD   S/P partial lobectomy of lung   History of pulmonary embolism   Type 2 diabetes mellitus (HCC)   Metastatic colorectal cancer (HCC)   Elevated bilirubin    This patient was discussed with the ED physician, including pertinent vitals, physical exam findings, labs, and imaging.  We also discussed care given by the ED provider.  1. Abdominal pain a. EDP discussed patient with general surgery and who thought that this represented slow transit as opposed to bowel obstruction.  Recommended conservative measures, observation overnight, right upper quadrant ultrasound. b. MiraLAX tonight c. Patient will need some sort of bowel regimen, given that he is on narcotics and Zofran, both of which slow transit 2. Elevated bilirubin a. Question obstruction with dilated loops.  Will check ultrasound in the morning b. GI consulted by EDP 3. GERD a. Continue Protonix 4. Type 2 diabetes a. Sliding scale insulin 5. Metastatic colorectal cancer a. On chemotherapy b. On pain medicine 6. Status post partial lobectomy of lung 7. History of PE a. No new PE b. On Xarelto  DVT prophylaxis: Xarelto Consultants: General surgery, GI Code Status: Full code confirmed by patient Family Communication: Wife present during interview and exam Disposition Plan: Patient should be able to return home following improvement of GI symptoms   Truett Mainland, DO

## 2019-07-17 ENCOUNTER — Observation Stay (HOSPITAL_COMMUNITY): Payer: Medicare Other

## 2019-07-17 DIAGNOSIS — K219 Gastro-esophageal reflux disease without esophagitis: Secondary | ICD-10-CM | POA: Diagnosis not present

## 2019-07-17 DIAGNOSIS — K831 Obstruction of bile duct: Secondary | ICD-10-CM | POA: Diagnosis not present

## 2019-07-17 DIAGNOSIS — R1011 Right upper quadrant pain: Secondary | ICD-10-CM | POA: Diagnosis not present

## 2019-07-17 DIAGNOSIS — C787 Secondary malignant neoplasm of liver and intrahepatic bile duct: Secondary | ICD-10-CM | POA: Diagnosis not present

## 2019-07-17 DIAGNOSIS — Z933 Colostomy status: Secondary | ICD-10-CM | POA: Diagnosis not present

## 2019-07-17 DIAGNOSIS — E1151 Type 2 diabetes mellitus with diabetic peripheral angiopathy without gangrene: Secondary | ICD-10-CM | POA: Diagnosis not present

## 2019-07-17 DIAGNOSIS — K838 Other specified diseases of biliary tract: Secondary | ICD-10-CM | POA: Diagnosis not present

## 2019-07-17 DIAGNOSIS — Z87442 Personal history of urinary calculi: Secondary | ICD-10-CM | POA: Diagnosis not present

## 2019-07-17 DIAGNOSIS — I739 Peripheral vascular disease, unspecified: Secondary | ICD-10-CM | POA: Diagnosis not present

## 2019-07-17 DIAGNOSIS — D638 Anemia in other chronic diseases classified elsewhere: Secondary | ICD-10-CM | POA: Diagnosis present

## 2019-07-17 DIAGNOSIS — I10 Essential (primary) hypertension: Secondary | ICD-10-CM | POA: Diagnosis present

## 2019-07-17 DIAGNOSIS — R17 Unspecified jaundice: Secondary | ICD-10-CM | POA: Diagnosis not present

## 2019-07-17 DIAGNOSIS — K8021 Calculus of gallbladder without cholecystitis with obstruction: Secondary | ICD-10-CM | POA: Diagnosis not present

## 2019-07-17 DIAGNOSIS — E119 Type 2 diabetes mellitus without complications: Secondary | ICD-10-CM | POA: Diagnosis not present

## 2019-07-17 DIAGNOSIS — R911 Solitary pulmonary nodule: Secondary | ICD-10-CM

## 2019-07-17 DIAGNOSIS — Z885 Allergy status to narcotic agent status: Secondary | ICD-10-CM | POA: Diagnosis not present

## 2019-07-17 DIAGNOSIS — Z86718 Personal history of other venous thrombosis and embolism: Secondary | ICD-10-CM | POA: Diagnosis not present

## 2019-07-17 DIAGNOSIS — K8681 Exocrine pancreatic insufficiency: Secondary | ICD-10-CM | POA: Diagnosis present

## 2019-07-17 DIAGNOSIS — C20 Malignant neoplasm of rectum: Secondary | ICD-10-CM

## 2019-07-17 DIAGNOSIS — C7889 Secondary malignant neoplasm of other digestive organs: Secondary | ICD-10-CM | POA: Diagnosis not present

## 2019-07-17 DIAGNOSIS — C7800 Secondary malignant neoplasm of unspecified lung: Secondary | ICD-10-CM | POA: Diagnosis not present

## 2019-07-17 DIAGNOSIS — G893 Neoplasm related pain (acute) (chronic): Secondary | ICD-10-CM | POA: Diagnosis present

## 2019-07-17 DIAGNOSIS — Z833 Family history of diabetes mellitus: Secondary | ICD-10-CM | POA: Diagnosis not present

## 2019-07-17 DIAGNOSIS — Z20822 Contact with and (suspected) exposure to covid-19: Secondary | ICD-10-CM | POA: Diagnosis not present

## 2019-07-17 DIAGNOSIS — C19 Malignant neoplasm of rectosigmoid junction: Secondary | ICD-10-CM | POA: Diagnosis not present

## 2019-07-17 DIAGNOSIS — K869 Disease of pancreas, unspecified: Secondary | ICD-10-CM | POA: Diagnosis present

## 2019-07-17 DIAGNOSIS — R1030 Lower abdominal pain, unspecified: Secondary | ICD-10-CM | POA: Diagnosis not present

## 2019-07-17 DIAGNOSIS — F419 Anxiety disorder, unspecified: Secondary | ICD-10-CM | POA: Diagnosis not present

## 2019-07-17 DIAGNOSIS — F329 Major depressive disorder, single episode, unspecified: Secondary | ICD-10-CM | POA: Diagnosis not present

## 2019-07-17 DIAGNOSIS — K5900 Constipation, unspecified: Secondary | ICD-10-CM | POA: Diagnosis not present

## 2019-07-17 DIAGNOSIS — K8020 Calculus of gallbladder without cholecystitis without obstruction: Secondary | ICD-10-CM | POA: Diagnosis not present

## 2019-07-17 DIAGNOSIS — R079 Chest pain, unspecified: Secondary | ICD-10-CM | POA: Diagnosis present

## 2019-07-17 DIAGNOSIS — A419 Sepsis, unspecified organism: Secondary | ICD-10-CM | POA: Diagnosis not present

## 2019-07-17 DIAGNOSIS — R7989 Other specified abnormal findings of blood chemistry: Secondary | ICD-10-CM

## 2019-07-17 DIAGNOSIS — C7951 Secondary malignant neoplasm of bone: Secondary | ICD-10-CM | POA: Diagnosis not present

## 2019-07-17 DIAGNOSIS — Z923 Personal history of irradiation: Secondary | ICD-10-CM | POA: Diagnosis not present

## 2019-07-17 LAB — CBC
HCT: 33.4 % — ABNORMAL LOW (ref 39.0–52.0)
Hemoglobin: 10.3 g/dL — ABNORMAL LOW (ref 13.0–17.0)
MCH: 27.8 pg (ref 26.0–34.0)
MCHC: 30.8 g/dL (ref 30.0–36.0)
MCV: 90 fL (ref 80.0–100.0)
Platelets: 267 10*3/uL (ref 150–400)
RBC: 3.71 MIL/uL — ABNORMAL LOW (ref 4.22–5.81)
RDW: 17.9 % — ABNORMAL HIGH (ref 11.5–15.5)
WBC: 14.3 10*3/uL — ABNORMAL HIGH (ref 4.0–10.5)
nRBC: 0 % (ref 0.0–0.2)

## 2019-07-17 LAB — COMPREHENSIVE METABOLIC PANEL
ALT: 250 U/L — ABNORMAL HIGH (ref 0–44)
AST: 227 U/L — ABNORMAL HIGH (ref 15–41)
Albumin: 2.5 g/dL — ABNORMAL LOW (ref 3.5–5.0)
Alkaline Phosphatase: 513 U/L — ABNORMAL HIGH (ref 38–126)
Anion gap: 8 (ref 5–15)
BUN: 8 mg/dL (ref 8–23)
CO2: 24 mmol/L (ref 22–32)
Calcium: 7.9 mg/dL — ABNORMAL LOW (ref 8.9–10.3)
Chloride: 103 mmol/L (ref 98–111)
Creatinine, Ser: 0.57 mg/dL — ABNORMAL LOW (ref 0.61–1.24)
GFR calc Af Amer: >60 mL/min (ref >60)
GFR calc non Af Amer: >60 mL/min (ref >60)
Glucose, Bld: 43 mg/dL — CL (ref 70–99)
Potassium: 3.2 mmol/L — ABNORMAL LOW (ref 3.5–5.1)
Sodium: 135 mmol/L (ref 135–145)
Total Bilirubin: 5.1 mg/dL — ABNORMAL HIGH (ref 0.3–1.2)
Total Protein: 5.1 g/dL — ABNORMAL LOW (ref 6.5–8.1)

## 2019-07-17 LAB — HEMOGLOBIN A1C
Hgb A1c MFr Bld: 6.4 % — ABNORMAL HIGH (ref 4.8–5.6)
Mean Plasma Glucose: 136.98 mg/dL

## 2019-07-17 LAB — GLUCOSE, CAPILLARY
Glucose-Capillary: 75 mg/dL (ref 70–99)
Glucose-Capillary: 91 mg/dL (ref 70–99)
Glucose-Capillary: 66 mg/dL — ABNORMAL LOW (ref 70–99)
Glucose-Capillary: 127 mg/dL — ABNORMAL HIGH (ref 70–99)

## 2019-07-17 LAB — SARS CORONAVIRUS 2 (TAT 6-24 HRS): SARS Coronavirus 2: NEGATIVE

## 2019-07-17 MED ORDER — IBUPROFEN 400 MG PO TABS
200.0000 mg | ORAL_TABLET | Freq: Once | ORAL | Status: AC
  Administered 2019-07-17: 200 mg via ORAL
  Filled 2019-07-17: qty 1

## 2019-07-17 MED ORDER — HYDROMORPHONE HCL 1 MG/ML IJ SOLN
1.0000 mg | Freq: Four times a day (QID) | INTRAMUSCULAR | Status: DC | PRN
  Administered 2019-07-17: 1 mg via INTRAVENOUS
  Filled 2019-07-17: qty 1

## 2019-07-17 MED ORDER — SODIUM CHLORIDE 0.9 % IV SOLN: 1.0000 g | INTRAVENOUS | Status: DC

## 2019-07-17 MED ORDER — SODIUM CHLORIDE 0.9 % IV SOLN
2.0000 g | INTRAVENOUS | Status: DC
  Administered 2019-07-17: 2 g via INTRAVENOUS
  Filled 2019-07-17 (×2): qty 20

## 2019-07-17 MED ORDER — HYDROMORPHONE HCL 1 MG/ML PO LIQD: 1.0000 mg | Freq: Four times a day (QID) | ORAL | Status: DC | PRN

## 2019-07-17 MED ORDER — OXYCODONE HCL 5 MG PO TABS
5.0000 mg | ORAL_TABLET | ORAL | Status: DC | PRN
  Administered 2019-07-17 – 2019-07-18 (×7): 5 mg via ORAL
  Filled 2019-07-17 (×7): qty 1

## 2019-07-17 MED ORDER — OXYCODONE-ACETAMINOPHEN 5-325 MG PO TABS
1.0000 | ORAL_TABLET | ORAL | Status: DC | PRN
  Administered 2019-07-17 – 2019-07-18 (×6): 1 via ORAL
  Filled 2019-07-17 (×6): qty 1

## 2019-07-17 NOTE — Consult Note (Signed)
Referring Provider: Orson Eva, DO Primary Care Physician:  [name], MD Primary Gastroenterologist:  Dr. Laural Golden  Reason for Consultation:   Jaundice in a patient with known metastatic disease to pancreas who underwent biliary stenting in May 2020.  HPI:   Patient is 69 year old Caucasian male who has history of rectal carcinoma which was diagnosed in March 2012 and he was treated with chemoradiation followed by surgery.  His disease recurred about 4 years later with pulmonary mets confirmed with biopsy subsequently had right video assisted thoracoscopy with wedge resection of right middle lobe and has been receiving chemotherapy under supervision of Dr. [name] of oncology. Patient presented with obstructive jaundice in May last year secondary to metastatic disease involving pancreas.  He underwent ERCP on 09/16/2018 with placement of fully covered Wallstent with resolution of his cholestasis.  Few days later he underwent palliative transverse loop colostomy because of anastomotic stricture.  2 months later he required percutaneous drainage for appendiceal abscess. He now presents with 2-week history of right-sided flank and abdominal pain radiating up into his chest.  He has had few chills but no fever.  He has lost his appetite.  He has not experienced shortness of breath or fever.  He says he has been gradually losing weight.  He states he has lost 30 pounds in the last 1 year.  He states his stool was black yesterday with this morning he has been passing brown stool.  He does not recall that he ever saw a stent.  He has had problems with constipation but no rectal bleeding. Patient presented to emergency room last evening with worsening pain.  He did not receive chemotherapy week earlier because of elevated transaminases. Evaluation in emergency room revealed bilirubin of 4.8 with elevated transaminases and alkaline phosphatase of 596.  6 days earlier his bilirubin was 1 and AP was 391 with  elevated transaminases.  He underwent CTA chest and study was negative for pulmonary embolus but revealed progressive metastatic disease.  Abdominal pelvic CT revealed dilated intrahepatic and extrahepatic bile duct to the level of mass in head/uncinate process of pancreas.  There was no stent to be found.  There is also mild dilation to loops of small bowel. Patient was therefore hospitalized further management. Patient states last dose of Xarelto/rivaroxaban was yesterday morning.  He is married.  His wife is at bedside.  They have 2 children in good health.  He retired at age 81.  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  He has a son age 17 and daughter age 67 in good health.  He has never smoked cigarettes.  He used to drink alcohol and excessive amount but quit 30 years ago.  His wife states he drank too much for about 17 years but never required hospitalization.. Father had coronary artery disease and diabetes mellitus and lived to be 1.  Mother had rectal cancer and died at age 44.  78 brother died of throat carcinoma at age 34.  He was diagnosed 2 years earlier.  Another brother is being treated for prostate carcinoma.  He is 69 years old and has been in remission for 12 years.   Past Medical History:  Diagnosis Date  . Chronic anticoagulation   . Colon cancer ([place]) 07/24/2010   rectal ca, inv adenocarcinoma  . Depression 04/01/2011  . Diabetes mellitus without complication ([place])   . DVT (deep venous thrombosis) ([place]) 05/09/2011  . GERD (gastroesophageal reflux disease)   . High output  ileostomy ([place]) 05/21/2011  . History of kidney stones   . HTN (hypertension)   . Hx of radiation therapy 09/02/10 to 10/14/10   pelvis  . Lung metastasis ([place])   . Neuropathy   . Peripheral vascular disease ([place])    dvt's,pe  . Pneumonia    hx x3  . Pulmonary embolism ([place])   . Rectal cancer ([place]) 01/15/2011   S/P radiation and concurrent 5-FU continuous infusion from 09/09/10-  10/10/10.  S/P proctectomy with colorectal anastomosis and diverting loop ileostomy on 11/14/10 at [facility] by Dr. Harlon Ditty. Pathology reveals a pT3b N1 with 3/20 lymph nodes.      Past Surgical History:  Procedure Laterality Date  . BILIARY STENT PLACEMENT N/A 09/16/2018   Procedure: STENT PLACEMENT;  Surgeon: Rogene Houston, MD;  [facility];  Service: Endoscopy;  Laterality: N/A;  . BIOPSY  09/24/2018   Procedure: BIOPSY;  Surgeon: Danie Binder, MD;  [facility];  Service: Endoscopy;;  . COLON SURGERY  11/14/2010   proctectomy with colorectal anastomosis and diverting loop ileostomy (temporary planned)  . COLONOSCOPY  07/2010   proximal rectal apple core mass 10-14cm from anal verge (adenocarcinoma), 2-3cm distal rectal carpet polyp s/p piecemeal snare polypectomy (adenoma)  . COLONOSCOPY  04/21/2012   RMR: Friable,fibrotic appearing colorectal anastomosis producing some luminal narrowing-not felt to be critical. path: focal erosion with slight inflammation and hyperemia. SURVEILLANCE DUE DEC 2015  . COLONOSCOPY N/A 11/24/2013   Dr. Rourk:somewhat fibrotic/friable anastomotic mucosal-status post biopsy (narrowing not felt to be clinically significant) Single colonic diverticulum. benign polypoid rectal mucosa  . colostomy reversal  april 2013  . ERCP N/A 09/16/2018   Procedure: ENDOSCOPIC RETROGRADE CHOLANGIOPANCREATOGRAPHY (ERCP);  Surgeon: Rogene Houston, MD;  [facility];  Service: Endoscopy;  Laterality: N/A;  . ESOPHAGOGASTRODUODENOSCOPY  07/2010   RMR: schatki ring s/p dilation, small hh, SB bx benign  . ESOPHAGOGASTRODUODENOSCOPY N/A 09/04/2015   Procedure: ESOPHAGOGASTRODUODENOSCOPY (EGD);  Surgeon: Daneil Dolin, MD;  [facility];  Service: Endoscopy;  Laterality: N/A;  730  . EUS  08/2010   Dr. Owens Loffler. uT3N0 circumferential, nearly obstruction rectosigmoid adenocarcinoma, distal edge 12cm from anal verge  . EUS N/A  06/19/2016   Procedure: UPPER ENDOSCOPIC ULTRASOUND (EUS) RADIAL;  Surgeon: Milus Banister, MD;  [facility];  Service: Endoscopy;  Laterality: N/A;  . FLEXIBLE SIGMOIDOSCOPY N/A 09/24/2018   Procedure: FLEXIBLE SIGMOIDOSCOPY;  Surgeon: Danie Binder, MD;  [facility];  Service: Endoscopy;  Laterality: N/A;  . HERNIA REPAIR     abd hernia repair  . IVC filter    . ivc filter    . port a cath placement    . PORT-A-CATH REMOVAL  09/24/2011   Procedure: REMOVAL PORT-A-CATH;  Surgeon: Donato Heinz, MD;  Location: [facility];  Service: General;  Laterality: N/A;  Minor Room  . PORTACATH PLACEMENT Right 07/07/2016   Procedure: INSERTION PORT-A-CATH;  Surgeon: Aviva Signs, MD;  Location: [facility];  Service: General;  Laterality: Right;  . SPHINCTEROTOMY N/A 09/16/2018   Procedure: SPHINCTEROTOMY with balloon dialation;  Surgeon: Rogene Houston, MD;  [facility];  Service: Endoscopy;  Laterality: N/A;  . TRANSVERSE LOOP COLOSTOMY N/A 09/29/2018   Procedure: TRANSVERSE LOOP COLOSTOMY;  Surgeon: Aviva Signs, MD;  Location: [facility];  Service: General;  Laterality: N/A;  . VIDEO ASSISTED THORACOSCOPY (VATS)/WEDGE RESECTION Right 11/15/2014   Procedure: VIDEO ASSISTED THORACOSCOPY (VATS)/LUNG RESECTION  WITH RIGHT LINGULECTOMY;  Surgeon: [name], MD;  Location: [facility];  Service: Thoracic;  Laterality: Right;  Marland Kitchen VIDEO BRONCHOSCOPY N/A 11/15/2014   Procedure: VIDEO BRONCHOSCOPY;  Surgeon: [name], MD;  Location: [facility] OR;  Service: Thoracic;  Laterality: N/A;    Prior to Admission medications   Medication Sig Start Date End Date Taking? Authorizing Provider  acetaminophen (TYLENOL) 500 MG tablet Take 1,000 mg by mouth every 6 (six) hours as needed for mild pain or moderate pain.    Yes [provider]  clonazePAM (KLONOPIN) 1 MG tablet TAKE 1 TABLET BY MOUTH THREE TIMES DAILY AS NEEDED Patient taking differently: Take 1 mg by mouth 3 (three) times  daily as needed for anxiety.  06/28/19  Yes Lockamy, Randi L, NP-C  gabapentin (NEURONTIN) 300 MG capsule Take 3 capsules (900 mg total) by mouth 2 (two) times daily. 06/10/19  Yes Lockamy, Randi L, NP-C  glimepiride (AMARYL) 2 MG tablet Take 2 mg by mouth 2 (two) times daily before a meal.    Yes [provider]  magnesium oxide (MAG-OX) 400 (241.3 Mg) MG tablet Take 1 tablet (400 mg total) by mouth 2 (two) times daily. 05/11/19  Yes Derek Jack, MD  mirtazapine (REMERON) 15 MG tablet Take 7.5 mg by mouth at bedtime. 05/20/19  Yes [provider]  oxyCODONE-acetaminophen (PERCOCET) 10-325 MG tablet Take 1 tablet by mouth every 8 (eight) hours as needed for pain. 07/11/19  Yes Derek Jack, MD  pantoprazole (PROTONIX) 40 MG tablet Take 40 mg by mouth every evening.  12/13/18  Yes [provider]  Probiotic Product (PROBIOTIC DAILY PO) Take 1 capsule by mouth 2 (two) times daily.    Yes [provider]  rivaroxaban (XARELTO) 20 MG TABS tablet Take 1 tablet (20 mg total) by mouth daily with supper. 09/19/18  Yes Tishara Pizano, Mechele Dawley, MD  vitamin B-12 (CYANOCOBALAMIN) 1000 MCG tablet Take 1,000 mcg by mouth daily.   Yes [provider]    Current Facility-Administered Medications  Medication Dose Route Frequency Provider Last Rate Last Admin  . 0.9 %  sodium chloride infusion   Intravenous Continuous Truett Mainland, DO 100 mL/hr at 07/17/19 1113 New Bag at 07/17/19 1113  . clonazePAM (KLONOPIN) tablet 1 mg  1 mg Oral TID PRN Truett Mainland, DO   1 mg at 07/17/19 0021  . dronabinol (MARINOL) capsule 5 mg  5 mg Oral BID AC Truett Mainland, DO   5 mg at 07/17/19 0901  . gabapentin (NEURONTIN) capsule 900 mg  900 mg Oral BID Truett Mainland, DO   900 mg at 07/17/19 0901  . insulin aspart (novoLOG) injection 0-15 Units  0-15 Units Subcutaneous TID WC Truett Mainland, DO      . insulin aspart (novoLOG) injection 0-5 Units  0-5 Units Subcutaneous QHS  Stinson, Jacob J, DO      . mirtazapine (REMERON) tablet 7.5 mg  7.5 mg Oral QHS Stinson, Jacob J, DO      . ondansetron [facility]) tablet 4 mg  4 mg Oral Q6H PRN Truett Mainland, DO       Or  . ondansetron [facility]) injection 4 mg  4 mg Intravenous Q6H PRN Truett Mainland, DO      . oxyCODONE-acetaminophen (PERCOCET/ROXICET) 5-325 MG per tablet 1 tablet  1 tablet Oral Q4H PRN Truett Mainland, DO   1 tablet at 07/17/19 1110   And  . oxyCODONE (Oxy IR/ROXICODONE) immediate  release tablet 5 mg  5 mg Oral Q4H PRN Truett Mainland, DO   5 mg at 07/17/19 1110  . pantoprazole (PROTONIX) EC tablet 40 mg  40 mg Oral QPM Stinson, Jacob J, DO      . polyethylene glycol (MIRALAX / GLYCOLAX) packet 17 g  17 g Oral Daily PRN Truett Mainland, DO      . rivaroxaban (XARELTO) tablet 20 mg  20 mg Oral Q supper Truett Mainland, DO       Facility-Administered Medications Ordered in Other Encounters  Medication Dose Route Frequency Provider Last Rate Last Admin  . sodium chloride 0.9 % injection 10 mL  10 mL Intravenous PRN Everardo All, MD   10 mL at 08/22/11 1114  . sodium chloride flush (NS) 0.9 % injection 10 mL  10 mL Intracatheter PRN Ardath Sax, MD   10 mL at 05/08/17 1127    Allergies as of 07/16/2019 - Review Complete 07/16/2019  Allergen Reaction Noted  . Oxycodone  11/23/2014  . Tramadol  11/23/2014  . Trazodone and nefazodone Other (See Comments) 09/08/2014    Family History  Problem Relation Age of Onset  . Cancer Brother        throat  . Cancer Brother        prostate  . Colon cancer Neg Hx   . Liver disease Neg Hx   . Inflammatory bowel disease Neg Hx     Social History   Socioeconomic History  . Marital status: Married    Spouse name: Not on file  . Number of children: 2  . Years of education: Not on file  . Highest education level: Not on file  Occupational History  . Occupation: self-employed Regulatory affairs officer, currently not working    Fish farm manager: SELF  EMPLOYED  Tobacco Use  . Smoking status: Never Smoker  . Smokeless tobacco: Never Used  Substance and Sexual Activity  . Alcohol use: No  . Drug use: No  . Sexual activity: Yes    Birth control/protection: None    Comment: married  Other Topics Concern  . Not on file  Social History Narrative  . Not on file   Social Determinants of Health   Financial Resource Strain: Low Risk   . Difficulty of Paying Living Expenses: Not very hard  Food Insecurity: No Food Insecurity  . Worried About Charity fundraiser in the Last Year: Never true  . Ran Out of Food in the Last Year: Never true  Transportation Needs: No Transportation Needs  . Lack of Transportation (Medical): No  . Lack of Transportation (Non-Medical): No  Physical Activity: Inactive  . Days of Exercise per Week: 0 days  . Minutes of Exercise per Session: 0 min  Stress: No Stress Concern Present  . Feeling of Stress : Only a little  Social Connections: Unknown  . Frequency of Communication with Friends and Family: More than three times a week  . Frequency of Social Gatherings with Friends and Family: Once a week  . Attends Religious Services: Patient refused  . Active Member of Clubs or Organizations: Patient refused  . Attends Archivist Meetings: Patient refused  . Marital Status: Patient refused  Intimate Partner Violence: Unknown  . Fear of Current or Ex-Partner: Patient refused  . Emotionally Abused: Patient refused  . Physically Abused: Patient refused  . Sexually Abused: Patient refused    Review of Systems: See HPI, otherwise normal ROS  Physical Exam: Temp:  [98.1  F (36.7 C)-98.8 F (37.1 C)] 98.8 F (37.1 C) (03/14 0506) Pulse Rate:  [34-111] 102 (03/14 0506) Resp:  [17-30] 17 (03/14 0506) BP: (114-138)/(57-102) 127/78 (03/14 0506) SpO2:  [96 %-100 %] 98 % (03/14 0506) Weight:  [71.9 kg-74.4 kg] 71.9 kg (03/13 2324) Last BM Date: (colostomy)  Patient is alert and in no acute  distress. Conjunctiva is pink.  Sclera is mildly icteric. Oropharyngeal mucosa is normal.  Dentition in fair condition.  Few teeth are missing. No neck masses or thyromegaly noted. Cardiac exam with regular rhythm normal S1 and S2.  No murmur or gallop noted. Abdomen is flat.  He has colostomy located in left upper mid abdomen.  Colostomy bag is soft brown stool.  He has small scar in right lower quadrant where he had ileostomy early on.  On palpation abdomen is soft and nontender with organomegaly or masses. Extremities are thin.  He has trace edema around ankles.  Lab Results: Recent Labs    07/16/19 1859  WBC 13.4*  HGB 11.2*  HCT 34.6*  PLT 308   BMET Recent Labs    07/16/19 1859  NA 133*  K 3.6  CL 101  CO2 22  GLUCOSE 183*  BUN 10  CREATININE 0.65  CALCIUM 8.3*   LFT Recent Labs    07/16/19 1859  PROT 5.6*  ALBUMIN 2.9*  AST 276*  ALT 293*  ALKPHOS 596*  BILITOT 4.8*   PT/INR No results for input(s): LABPROT, INR in the last 72 hours. Hepatitis Panel No results for input(s): HEPBSAG, HCVAB, HEPAIGM, HEPBIGM in the last 72 hours.  Studies/Results: CT Angio Chest PE W and/or Wo Contrast  Result Date: 07/16/2019 CLINICAL DATA:  69 year old male with right lower quadrant abdominal pain radiating to the flank area as well as chest pain. History of metastatic rectal cancer. EXAM: CT ANGIOGRAPHY CHEST CT ABDOMEN AND PELVIS WITH CONTRAST TECHNIQUE: Multidetector CT imaging of the chest was performed using the standard protocol during bolus administration of intravenous contrast. Multiplanar CT image reconstructions and MIPs were obtained to evaluate the vascular anatomy. Multidetector CT imaging of the abdomen and pelvis was performed using the standard protocol during bolus administration of intravenous contrast. CONTRAST:  136mL OMNIPAQUE IOHEXOL 350 MG/ML SOLN COMPARISON:  CT of the chest abdomen pelvis dated 04/19/2019. FINDINGS: CTA CHEST FINDINGS Cardiovascular:  There is no cardiomegaly or pericardial effusion. There is 3 vessel coronary vascular calcification. Mild atherosclerotic calcification of the thoracic aorta. No aneurysmal dilatation or dissection. No pulmonary artery embolus identified. Right-sided Port-A-Cath with tip in the region of the cavoatrial junction. Mediastinum/Nodes: Mildly enlarged right hilar lymph node measures 11 mm similar to prior CT. No new adenopathy. The esophagus and the thyroid gland are grossly unremarkable. No mediastinal fluid collection. Lungs/Pleura: There is a 1 cm right upper lobe nodule along the major fissure (series 7, image 65), similar to prior CT. Additional right upper lobe subpleural nodule (series 7, image 34) measures approximately 13 mm (previously 11 mm). A 4 mm nodule at the right lung base (series 7, image 118) previously measured 2 mm. A 5 mm nodule at the right lung base (series 7, image 125) previously measured 2 mm. There is an area of scarring in the right middle lobe inferiorly. There is a small right pleural effusion which has increased in size since the prior CT. There is no pneumothorax. The central airways are patent. Musculoskeletal: There has been increase in the size of the mixed lytic and sclerotic metastatic disease involving  the posterior right eleventh rib. There is degenerative changes of the spine. There is mixed lytic and sclerotic metastasis involving the sternum. Review of the MIP images confirms the above findings. CT ABDOMEN and PELVIS FINDINGS No intra-abdominal free air or free fluid. Hepatobiliary: Probable background of fatty infiltration of the liver. There has been interval increase in the size of the hypoenhancing lesion in the inferior right lobe of the liver (series 2, image 25) now measuring up to 2.1 cm in greatest dimension (previously approximately 7 mm). Several additional smaller hypodense lesions in segment VII appear new since the prior CT. The gallbladder is unremarkable. There  is dilatation of the intrahepatic biliary tree, progressed since the prior CT. There is also dilatation of the common bile duct. The common bile duct measures approximately 10 mm in diameter, previously measured approximately 8 mm. Pancreas: There is a 3.5 x 2.4 cm hypoenhancing mass in the head/uncinate process of the pancreas (previously measuring approximately 3.8 x 2.1 cm). There is complete atrophy of the gland with dilatation of the main pancreatic duct as seen previously. Spleen: Normal in size without focal abnormality. Adrenals/Urinary Tract: There is calcification of the medial limb of the left adrenal gland, likely sequela of prior insult. The right adrenal gland is unremarkable. There is no hydronephrosis on either side. There is symmetric enhancement and excretion of contrast by both kidneys. The visualized ureters and urinary bladder appear unremarkable. Stomach/Bowel: Postsurgical changes of sigmoid resection with anastomosis. There is a left anterior abdominal loop colostomy. Anastomotic suture is also noted in the right lower quadrant. There is mildly dilated and fecalized loop of bowel in the right hemipelvis which may represent a degree of obstruction. The appendix is normal. Vascular/Lymphatic: Mild aortoiliac atherosclerotic disease. An infrarenal IVC filter is noted. The SMV, splenic vein, and main portal vein are patent. No portal venous gas. Mildly enlarged portacaval lymph node measures approximately 11 mm in short axis (previously 7 mm). Additional mildly enlarged gastrohepatic lymph node measures approximately 11 mm in short axis. Reproductive: The prostate and seminal vesicles are grossly unremarkable. Other: None Musculoskeletal: There is degenerative changes of the spine. No acute osseous pathology. Review of the MIP images confirms the above findings. IMPRESSION: 1. No CT evidence of pulmonary artery embolus. 2. Mildly dilated and fecalized small bowel loop in the pelvis may represent  a partial or early obstruction. Clinical correlation and follow-up recommended. 3. Overall progression of disease with progression of pulmonary and hepatic metastatic lesions. 4. Small right pleural effusion has increased in size since the prior CT. 5. No significant interval change in the size of the soft tissue mass in the head/uncinate process of the pancreas. 6. Worsened biliary ductal dilatation compared to the prior CT. 7. Interval increase in the size of the mixed lytic and sclerotic lesion involving the posterior right eleventh rib. 8. Aortic Atherosclerosis (ICD10-I70.0). Electronically Signed   By: Anner Crete M.D.   On: 07/16/2019 20:07   CT ABDOMEN PELVIS W CONTRAST  Result Date: 07/16/2019 CLINICAL DATA:  69 year old male with right lower quadrant abdominal pain radiating to the flank area as well as chest pain. History of metastatic rectal cancer. EXAM: CT ANGIOGRAPHY CHEST CT ABDOMEN AND PELVIS WITH CONTRAST TECHNIQUE: Multidetector CT imaging of the chest was performed using the standard protocol during bolus administration of intravenous contrast. Multiplanar CT image reconstructions and MIPs were obtained to evaluate the vascular anatomy. Multidetector CT imaging of the abdomen and pelvis was performed using the standard protocol during bolus  administration of intravenous contrast. CONTRAST:  139mL OMNIPAQUE IOHEXOL 350 MG/ML SOLN COMPARISON:  CT of the chest abdomen pelvis dated 04/19/2019. FINDINGS: CTA CHEST FINDINGS Cardiovascular: There is no cardiomegaly or pericardial effusion. There is 3 vessel coronary vascular calcification. Mild atherosclerotic calcification of the thoracic aorta. No aneurysmal dilatation or dissection. No pulmonary artery embolus identified. Right-sided Port-A-Cath with tip in the region of the cavoatrial junction. Mediastinum/Nodes: Mildly enlarged right hilar lymph node measures 11 mm similar to prior CT. No new adenopathy. The esophagus and the thyroid gland  are grossly unremarkable. No mediastinal fluid collection. Lungs/Pleura: There is a 1 cm right upper lobe nodule along the major fissure (series 7, image 65), similar to prior CT. Additional right upper lobe subpleural nodule (series 7, image 34) measures approximately 13 mm (previously 11 mm). A 4 mm nodule at the right lung base (series 7, image 118) previously measured 2 mm. A 5 mm nodule at the right lung base (series 7, image 125) previously measured 2 mm. There is an area of scarring in the right middle lobe inferiorly. There is a small right pleural effusion which has increased in size since the prior CT. There is no pneumothorax. The central airways are patent. Musculoskeletal: There has been increase in the size of the mixed lytic and sclerotic metastatic disease involving the posterior right eleventh rib. There is degenerative changes of the spine. There is mixed lytic and sclerotic metastasis involving the sternum. Review of the MIP images confirms the above findings. CT ABDOMEN and PELVIS FINDINGS No intra-abdominal free air or free fluid. Hepatobiliary: Probable background of fatty infiltration of the liver. There has been interval increase in the size of the hypoenhancing lesion in the inferior right lobe of the liver (series 2, image 25) now measuring up to 2.1 cm in greatest dimension (previously approximately 7 mm). Several additional smaller hypodense lesions in segment VII appear new since the prior CT. The gallbladder is unremarkable. There is dilatation of the intrahepatic biliary tree, progressed since the prior CT. There is also dilatation of the common bile duct. The common bile duct measures approximately 10 mm in diameter, previously measured approximately 8 mm. Pancreas: There is a 3.5 x 2.4 cm hypoenhancing mass in the head/uncinate process of the pancreas (previously measuring approximately 3.8 x 2.1 cm). There is complete atrophy of the gland with dilatation of the main pancreatic duct  as seen previously. Spleen: Normal in size without focal abnormality. Adrenals/Urinary Tract: There is calcification of the medial limb of the left adrenal gland, likely sequela of prior insult. The right adrenal gland is unremarkable. There is no hydronephrosis on either side. There is symmetric enhancement and excretion of contrast by both kidneys. The visualized ureters and urinary bladder appear unremarkable. Stomach/Bowel: Postsurgical changes of sigmoid resection with anastomosis. There is a left anterior abdominal loop colostomy. Anastomotic suture is also noted in the right lower quadrant. There is mildly dilated and fecalized loop of bowel in the right hemipelvis which may represent a degree of obstruction. The appendix is normal. Vascular/Lymphatic: Mild aortoiliac atherosclerotic disease. An infrarenal IVC filter is noted. The SMV, splenic vein, and main portal vein are patent. No portal venous gas. Mildly enlarged portacaval lymph node measures approximately 11 mm in short axis (previously 7 mm). Additional mildly enlarged gastrohepatic lymph node measures approximately 11 mm in short axis. Reproductive: The prostate and seminal vesicles are grossly unremarkable. Other: None Musculoskeletal: There is degenerative changes of the spine. No acute osseous pathology. Review of the  MIP images confirms the above findings. IMPRESSION: 1. No CT evidence of pulmonary artery embolus. 2. Mildly dilated and fecalized small bowel loop in the pelvis may represent a partial or early obstruction. Clinical correlation and follow-up recommended. 3. Overall progression of disease with progression of pulmonary and hepatic metastatic lesions. 4. Small right pleural effusion has increased in size since the prior CT. 5. No significant interval change in the size of the soft tissue mass in the head/uncinate process of the pancreas. 6. Worsened biliary ductal dilatation compared to the prior CT. 7. Interval increase in the size  of the mixed lytic and sclerotic lesion involving the posterior right eleventh rib. 8. Aortic Atherosclerosis (ICD10-I70.0). Electronically Signed   By: Anner Crete M.D.   On: 07/16/2019 20:07   DG Chest Portable 1 View  Result Date: 07/16/2019 CLINICAL DATA:  Chest pain, rectal cancer EXAM: PORTABLE CHEST 1 VIEW COMPARISON:  11/25/2018 chest radiograph. FINDINGS: Right subclavian Port-A-Cath terminates in the lower third of the SVC. Stable cardiomediastinal silhouette with normal heart size. No pneumothorax. No pleural effusion. Pulmonary nodules scattered in the mid and upper right lung, not definitely changed. No pulmonary edema. No acute consolidative airspace disease. IMPRESSION: 1. No acute cardiopulmonary disease. 2. Right pulmonary nodules, not definitely changed by chest radiographs. Electronically Signed   By: Ilona Sorrel M.D.   On: 07/16/2019 19:32   US Abdomen Limited RUQ  Result Date: 07/17/2019 CLINICAL DATA:  Abdominal pain EXAM: ULTRASOUND ABDOMEN LIMITED RIGHT UPPER QUADRANT COMPARISON:  Abdominal CT from yesterday FINDINGS: Gallbladder: Sludge and small gallstones. Wall thickness is 5 mm but there is no focal tenderness. Common bile duct: Diameter: 13 mm Liver: Intrahepatic bile duct dilatation. Liver lesions by CT are not clearly seen on this study, limited by bowel gas. Portal vein is patent on color Doppler imaging with normal direction of blood flow towards the liver. IMPRESSION: 1. Cholelithiasis and sludge. There is mild gallbladder wall thickening but no over distension or focal tenderness typical of acute cholecystitis. 2. Intra and extrahepatic bile duct dilatation in the setting of known pancreas mass. Electronically Signed   By: Monte Fantasia M.D.   On: 07/17/2019 10:36   Current studies reviewed along with CT from 02/22/2019 which revealed no biliary stent.  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  He now presents with 2-week history of right-sided abdominal and chest pain anorexia and continued weight loss.  He is found to have elevated bilirubin and transaminases.  Abdominal pelvic CT reveals mass in uncinate/head of pancreas with dilation to extra and intrahepatic biliary radicles.  Wallstent is nowhere to be found.  On review of CT from 02/22/2019 reveals the stent was gone. Suspect bile duct obstruction due to metastatic colon carcinoma involving pancreas.  Will need to be restented.  Procedure risks reviewed with patient and he is agreeable to proceed.  Mildly dilated loops of small bowel most likely related to narcotic therapy.  No evidence to suggest that he has bowel obstruction.  He is soft stool in colostomy bag.  Patient is on Xarelto/rivaroxaban and last dose was yesterday.  Recommendations;  ERCP with biliary stenting.  Given that he has metastatic disease we will plan to place a fully covered Wallstent. We will start patient on ceftriaxone 1 g IV every 24 hours.   LOS: 0 days  Gwenivere Hiraldo  07/17/2019, 1:06 PM

## 2019-07-17 NOTE — Plan of Care (Signed)

## 2019-07-17 NOTE — Consult Note (Signed)
[facility] Consult  Reason for Consult: Small bowel fecalization, dilated bile ducts  Referring Physician: Dr. Carles Collet   Chief Complaint    Abdominal Pain      HPI: Charles [name] is a 69 y.o. male with a history of metastatic rectal cancer to the lung and liver and pancreas with a transverse loop colostomy in place and s/p metal biliary stent for obstructive jaundice. He came into the hospital with abdominal pain, nausea and dry heaving.  He had been having some right sided pain for 2 weeks. He says he also had some darker stools.  He did some chemotherapy 3 weeks ago but had to stop due to elevated liver test.  He reports that he did not vomit. He has had ostomy output continuously.    He was worked up and noted to have an elevated bilirubin and no stent. US demonstrated cholelithiasis but no cholecystitis.  Dr. Laural Golden plans for ERCP tomorrow to replace the stent.   Past Medical History:  Diagnosis Date  . Chronic anticoagulation   . Colon cancer ([place]) 07/24/2010   rectal ca, inv adenocarcinoma  . Depression 04/01/2011  . Diabetes mellitus without complication ([place])   . DVT (deep venous thrombosis) ([place]) 05/09/2011  . GERD (gastroesophageal reflux disease)   . High output ileostomy ([place]) 05/21/2011  . History of kidney stones   . HTN (hypertension)   . Hx of radiation therapy 09/02/10 to 10/14/10   pelvis  . Lung metastasis ([place])   . Neuropathy   . Peripheral vascular disease ([place])    dvt's,pe  . Pneumonia    hx x3  . Pulmonary embolism ([place])   . Rectal cancer ([place]) 01/15/2011   S/P radiation and concurrent 5-FU continuous infusion from 09/09/10- 10/10/10.  S/P proctectomy with colorectal anastomosis and diverting loop ileostomy on 11/14/10 at [facility] by Dr. Harlon Ditty. Pathology reveals a pT3b N1 with 3/20 lymph nodes.      Past Surgical History:  Procedure Laterality Date  . BILIARY STENT PLACEMENT N/A 09/16/2018   Procedure: STENT PLACEMENT;  Surgeon:  Rogene Houston, MD;  [facility];  Service: Endoscopy;  Laterality: N/A;  . BIOPSY  09/24/2018   Procedure: BIOPSY;  Surgeon: Danie Binder, MD;  [facility];  Service: Endoscopy;;  . COLON SURGERY  11/14/2010   proctectomy with colorectal anastomosis and diverting loop ileostomy (temporary planned)  . COLONOSCOPY  07/2010   proximal rectal apple core mass 10-14cm from anal verge (adenocarcinoma), 2-3cm distal rectal carpet polyp s/p piecemeal snare polypectomy (adenoma)  . COLONOSCOPY  04/21/2012   RMR: Friable,fibrotic appearing colorectal anastomosis producing some luminal narrowing-not felt to be critical. path: focal erosion with slight inflammation and hyperemia. SURVEILLANCE DUE DEC 2015  . COLONOSCOPY N/A 11/24/2013   Dr. Rourk:somewhat fibrotic/friable anastomotic mucosal-status post biopsy (narrowing not felt to be clinically significant) Single colonic diverticulum. benign polypoid rectal mucosa  . colostomy reversal  april 2013  . ERCP N/A 09/16/2018   Procedure: ENDOSCOPIC RETROGRADE CHOLANGIOPANCREATOGRAPHY (ERCP);  Surgeon: Rogene Houston, MD;  [facility];  Service: Endoscopy;  Laterality: N/A;  . ESOPHAGOGASTRODUODENOSCOPY  07/2010   RMR: schatki ring s/p dilation, small hh, SB bx benign  . ESOPHAGOGASTRODUODENOSCOPY N/A 09/04/2015   Procedure: ESOPHAGOGASTRODUODENOSCOPY (EGD);  Surgeon: Daneil Dolin, MD;  [facility];  Service: Endoscopy;  Laterality: N/A;  730  . EUS  08/2010   Dr. Owens Loffler. uT3N0 circumferential, nearly obstruction rectosigmoid adenocarcinoma, distal  edge 12cm from anal verge  . EUS N/A 06/19/2016   Procedure: UPPER ENDOSCOPIC ULTRASOUND (EUS) RADIAL;  Surgeon: Milus Banister, MD;  [facility];  Service: Endoscopy;  Laterality: N/A;  . FLEXIBLE SIGMOIDOSCOPY N/A 09/24/2018   Procedure: FLEXIBLE SIGMOIDOSCOPY;  Surgeon: Danie Binder, MD;  [facility];  Service: Endoscopy;   Laterality: N/A;  . HERNIA REPAIR     abd hernia repair  . IVC filter    . ivc filter    . port a cath placement    . PORT-A-CATH REMOVAL  09/24/2011   Procedure: REMOVAL PORT-A-CATH;  Surgeon: Donato Heinz, MD;  Location: [facility];  Service: General;  Laterality: N/A;  Minor Room  . PORTACATH PLACEMENT Right 07/07/2016   Procedure: INSERTION PORT-A-CATH;  Surgeon: Aviva Signs, MD;  Location: [facility];  Service: General;  Laterality: Right;  . SPHINCTEROTOMY N/A 09/16/2018   Procedure: SPHINCTEROTOMY with balloon dialation;  Surgeon: Rogene Houston, MD;  [facility];  Service: Endoscopy;  Laterality: N/A;  . TRANSVERSE LOOP COLOSTOMY N/A 09/29/2018   Procedure: TRANSVERSE LOOP COLOSTOMY;  Surgeon: Aviva Signs, MD;  Location: [facility];  Service: General;  Laterality: N/A;  . VIDEO ASSISTED THORACOSCOPY (VATS)/WEDGE RESECTION Right 11/15/2014   Procedure: VIDEO ASSISTED THORACOSCOPY (VATS)/LUNG RESECTION WITH RIGHT LINGULECTOMY;  Surgeon: [name], MD;  Location: [place];  Service: Thoracic;  Laterality: Right;  Marland Kitchen VIDEO BRONCHOSCOPY N/A 11/15/2014   Procedure: VIDEO BRONCHOSCOPY;  Surgeon: [name], MD;  Location: [facility] OR;  Service: Thoracic;  Laterality: N/A;    Family History  Problem Relation Age of Onset  . Cancer Brother        throat  . Cancer Brother        prostate  . Colon cancer Neg Hx   . Liver disease Neg Hx   . Inflammatory bowel disease Neg Hx     Social History   Tobacco Use  . Smoking status: Never Smoker  . Smokeless tobacco: Never Used  Substance Use Topics  . Alcohol use: No  . Drug use: No    Medications: I have reviewed the patient's current medications. Current Facility-Administered Medications  Medication Dose Route Frequency Provider Last Rate Last Admin  . 0.9 %  sodium chloride infusion   Intravenous Continuous Truett Mainland, DO 100 mL/hr at 07/17/19 1113 New Bag at 07/17/19 1113  . cefTRIAXone (ROCEPHIN) 2 g in sodium  chloride 0.9 % 100 mL IVPB  2 g Intravenous Q24H Tat, David, MD      . clonazePAM [facility]) tablet 1 mg  1 mg Oral TID PRN Truett Mainland, DO   1 mg at 07/17/19 0021  . dronabinol (MARINOL) capsule 5 mg  5 mg Oral BID AC Truett Mainland, DO   5 mg at 07/17/19 0901  . gabapentin (NEURONTIN) capsule 900 mg  900 mg Oral BID Truett Mainland, DO   900 mg at 07/17/19 0901  . insulin aspart (novoLOG) injection 0-15 Units  0-15 Units Subcutaneous TID WC Truett Mainland, DO      . insulin aspart (novoLOG) injection 0-5 Units  0-5 Units Subcutaneous QHS Stinson, Jacob J, DO      . mirtazapine (REMERON) tablet 7.5 mg  7.5 mg Oral QHS Stinson, Jacob J, DO      . ondansetron [facility]) tablet 4 mg  4 mg Oral Q6H PRN Truett Mainland, DO       Or  . ondansetron (  ZOFRAN) injection 4 mg  4 mg Intravenous Q6H PRN Truett Mainland, DO      . oxyCODONE-acetaminophen (PERCOCET/ROXICET) 5-325 MG per tablet 1 tablet  1 tablet Oral Q4H PRN Truett Mainland, DO   1 tablet at 07/17/19 1110   And  . oxyCODONE (Oxy IR/ROXICODONE) immediate release tablet 5 mg  5 mg Oral Q4H PRN Truett Mainland, DO   5 mg at 07/17/19 1110  . pantoprazole (PROTONIX) EC tablet 40 mg  40 mg Oral QPM Stinson, Jacob J, DO      . polyethylene glycol (MIRALAX / GLYCOLAX) packet 17 g  17 g Oral Daily PRN Truett Mainland, DO      . rivaroxaban (XARELTO) tablet 20 mg  20 mg Oral Q supper Truett Mainland, DO       Facility-Administered Medications Ordered in Other Encounters  Medication Dose Route Frequency Provider Last Rate Last Admin  . sodium chloride 0.9 % injection 10 mL  10 mL Intravenous PRN Everardo All, MD   10 mL at 08/22/11 1114  . sodium chloride flush (NS) 0.9 % injection 10 mL  10 mL Intracatheter PRN Ardath Sax, MD   10 mL at 05/08/17 1127    Allergies  Allergen Reactions  . Oxycodone     Blisters, hallucinations  . Tramadol     Blisters, hallucinations  . Trazodone And Nefazodone Other (See Comments)     hallucinations     ROS:  A comprehensive review of systems was negative except for: Gastrointestinal: positive for abdominal pain and nausea  Blood pressure 120/69, pulse 83, temperature 98.6 F (37 C), temperature source Oral, resp. rate 18, height 6\' 2"  (1.88 m), weight 71.9 kg, SpO2 99 %. Physical Exam  Results: Results for orders placed or performed during the hospital encounter of 07/16/19 (from the past 48 hour(s))  Comprehensive metabolic panel     Status: Abnormal   Collection Time: 07/16/19  6:59 PM  Result Value Ref Range   Sodium 133 (L) 135 - 145 mmol/L   Potassium 3.6 3.5 - 5.1 mmol/L   Chloride 101 98 - 111 mmol/L   CO2 22 22 - 32 mmol/L   Glucose, Bld 183 (H) 70 - 99 mg/dL    Comment: Glucose reference range applies only to samples taken after fasting for at least 8 hours.   BUN 10 8 - 23 mg/dL   Creatinine, Ser 0.65 0.61 - 1.24 mg/dL   Calcium 8.3 (L) 8.9 - 10.3 mg/dL   Total Protein 5.6 (L) 6.5 - 8.1 g/dL   Albumin 2.9 (L) 3.5 - 5.0 g/dL   AST 276 (H) 15 - 41 U/L   ALT 293 (H) 0 - 44 U/L   Alkaline Phosphatase 596 (H) 38 - 126 U/L   Total Bilirubin 4.8 (H) 0.3 - 1.2 mg/dL   GFR calc non Af Amer >60 >60 mL/min   GFR calc Af Amer >60 >60 mL/min   Anion gap 10 5 - 15    Comment: Performed at [facility], [address], [place], [place] [address]  Lipase, blood     Status: None   Collection Time: 07/16/19  6:59 PM  Result Value Ref Range   Lipase 11 11 - 51 U/L    Comment: Performed at [facility], [address], [place], [place] [address]  CBC with Differential     Status: Abnormal   Collection Time: 07/16/19  6:59 PM  Result Value Ref Range   WBC  13.4 (H) 4.0 - 10.5 K/uL   RBC 3.98 (L) 4.22 - 5.81 MIL/uL   Hemoglobin 11.2 (L) 13.0 - 17.0 g/dL   HCT 34.6 (L) 39.0 - 52.0 %   MCV 86.9 80.0 - 100.0 fL   MCH 28.1 26.0 - 34.0 pg   MCHC 32.4 30.0 - 36.0 g/dL   RDW 17.2 (H) 11.5 - 15.5 %   Platelets 308 150 - 400 K/uL   nRBC 0.0 0.0 - 0.2 %   Neutrophils  Relative % 81 %   Neutro Abs 10.8 (H) 1.7 - 7.7 K/uL   Lymphocytes Relative 8 %   Lymphs Abs 1.1 0.7 - 4.0 K/uL   Monocytes Relative 10 %   Monocytes Absolute 1.3 (H) 0.1 - 1.0 K/uL   Eosinophils Relative 0 %   Eosinophils Absolute 0.1 0.0 - 0.5 K/uL   Basophils Relative 0 %   Basophils Absolute 0.1 0.0 - 0.1 K/uL   Immature Granulocytes 1 %   Abs Immature Granulocytes 0.08 (H) 0.00 - 0.07 K/uL    Comment: Performed at [facility], [address], [place], [place] [address]  Troponin I (High Sensitivity)     Status: None   Collection Time: 07/16/19  6:59 PM  Result Value Ref Range   Troponin I (High Sensitivity) 3 <18 ng/L    Comment: (NOTE) Elevated high sensitivity troponin I (hsTnI) values and significant  changes across serial measurements may suggest ACS but many other  chronic and acute conditions are known to elevate hsTnI results.  Refer to the "Links" section for chest pain algorithms and additional  guidance. Performed at [facility], [address] [place], [place] [address]   Troponin I (High Sensitivity)     Status: None   Collection Time: 07/16/19  8:43 PM  Result Value Ref Range   Troponin I (High Sensitivity) 4 <18 ng/L    Comment: (NOTE) Elevated high sensitivity troponin I (hsTnI) values and significant  changes across serial measurements may suggest ACS but many other  chronic and acute conditions are known to elevate hsTnI results.  Refer to the "Links" section for chest pain algorithms and additional  guidance. Performed at [facility], [address] [place], [place] [address]   Urinalysis, Routine w reflex microscopic     Status: Abnormal   Collection Time: 07/16/19 11:18 PM  Result Value Ref Range   Color, Urine AMBER (A) YELLOW    Comment: BIOCHEMICALS MAY BE AFFECTED BY COLOR   APPearance CLEAR CLEAR   Specific Gravity, Urine >1.046 (H) 1.005 - 1.030   pH 8.0 5.0 - 8.0   Glucose, UA NEGATIVE NEGATIVE mg/dL   Hgb urine dipstick NEGATIVE  NEGATIVE   Bilirubin Urine NEGATIVE NEGATIVE   Ketones, ur NEGATIVE NEGATIVE mg/dL   Protein, ur NEGATIVE NEGATIVE mg/dL   Nitrite NEGATIVE NEGATIVE   Leukocytes,Ua NEGATIVE NEGATIVE    Comment: Performed at [facility], [address] [place], [place] [address]  CBC     Status: Abnormal   Collection Time: 07/17/19  5:44 AM  Result Value Ref Range   WBC 14.3 (H) 4.0 - 10.5 K/uL   RBC 3.71 (L) 4.22 - 5.81 MIL/uL   Hemoglobin 10.3 (L) 13.0 - 17.0 g/dL   HCT 33.4 (L) 39.0 - 52.0 %   MCV 90.0 80.0 - 100.0 fL   MCH 27.8 26.0 - 34.0 pg   MCHC 30.8 30.0 - 36.0 g/dL   RDW 17.9 (H) 11.5 - 15.5 %   Platelets 267  150 - 400 K/uL   nRBC 0.0 0.0 - 0.2 %    Comment: Performed at [facility], [address] [place], [place] [address]  Comprehensive metabolic panel     Status: Abnormal   Collection Time: 07/17/19  5:44 AM  Result Value Ref Range   Sodium 135 135 - 145 mmol/L   Potassium 3.2 (L) 3.5 - 5.1 mmol/L   Chloride 103 98 - 111 mmol/L   CO2 24 22 - 32 mmol/L   Glucose, Bld 43 (LL) 70 - 99 mg/dL    Comment: Glucose reference range applies only to samples taken after fasting for at least 8 hours. CRITICAL RESULT CALLED TO, READ BACK BY AND VERIFIED WITH: RAHCEAL TATE,RN @1331  07/17/2019 KAY    BUN 8 8 - 23 mg/dL   Creatinine, Ser 0.57 (L) 0.61 - 1.24 mg/dL   Calcium 7.9 (L) 8.9 - 10.3 mg/dL   Total Protein 5.1 (L) 6.5 - 8.1 g/dL   Albumin 2.5 (L) 3.5 - 5.0 g/dL   AST 227 (H) 15 - 41 U/L   ALT 250 (H) 0 - 44 U/L   Alkaline Phosphatase 513 (H) 38 - 126 U/L   Total Bilirubin 5.1 (H) 0.3 - 1.2 mg/dL   GFR calc non Af Amer >60 >60 mL/min   GFR calc Af Amer >60 >60 mL/min   Anion gap 8 5 - 15    Comment: Performed at [facility], [address], [place], [place] [address]  Glucose, capillary     Status: None   Collection Time: 07/17/19  7:56 AM  Result Value Ref Range   Glucose-Capillary 75 70 - 99 mg/dL    Comment: Glucose reference range applies only to samples taken after fasting  for at least 8 hours.  Glucose, capillary     Status: None   Collection Time: 07/17/19 11:21 AM  Result Value Ref Range   Glucose-Capillary 91 70 - 99 mg/dL    Comment: Glucose reference range applies only to samples taken after fasting for at least 8 hours.   *Note: Due to a large number of results and/or encounters for the requested time period, some results have not been displayed. A complete set of results can be found in Results Review.   Personally reviewed- dilated biliary system, no biliary stent, fecalized small bowel and open anastomosis, no signs of obstruction, gallstones, dilated gallbladder, no cholecystitis on [place]  CT Angio Chest PE W and/or Wo Contrast  Result Date: 07/16/2019 CLINICAL DATA:  69 year old male with right lower quadrant abdominal pain radiating to the flank area as well as chest pain. History of metastatic rectal cancer. EXAM: CT ANGIOGRAPHY CHEST CT ABDOMEN AND PELVIS WITH CONTRAST TECHNIQUE: Multidetector CT imaging of the chest was performed using the standard protocol during bolus administration of intravenous contrast. Multiplanar CT image reconstructions and MIPs were obtained to evaluate the vascular anatomy. Multidetector CT imaging of the abdomen and pelvis was performed using the standard protocol during bolus administration of intravenous contrast. CONTRAST:  156mL OMNIPAQUE IOHEXOL 350 MG/ML SOLN COMPARISON:  CT of the chest abdomen pelvis dated 04/19/2019. FINDINGS: CTA CHEST FINDINGS Cardiovascular: There is no cardiomegaly or pericardial effusion. There is 3 vessel coronary vascular calcification. Mild atherosclerotic calcification of the thoracic aorta. No aneurysmal dilatation or dissection. No pulmonary artery embolus identified. Right-sided Port-A-Cath with tip in the region of the cavoatrial junction. Mediastinum/Nodes: Mildly enlarged right hilar lymph node measures 11 mm similar to prior CT. No new adenopathy. The esophagus and the  thyroid gland are  grossly unremarkable. No mediastinal fluid collection. Lungs/Pleura: There is a 1 cm right upper lobe nodule along the major fissure (series 7, image 65), similar to prior CT. Additional right upper lobe subpleural nodule (series 7, image 34) measures approximately 13 mm (previously 11 mm). A 4 mm nodule at the right lung base (series 7, image 118) previously measured 2 mm. A 5 mm nodule at the right lung base (series 7, image 125) previously measured 2 mm. There is an area of scarring in the right middle lobe inferiorly. There is a small right pleural effusion which has increased in size since the prior CT. There is no pneumothorax. The central airways are patent. Musculoskeletal: There has been increase in the size of the mixed lytic and sclerotic metastatic disease involving the posterior right eleventh rib. There is degenerative changes of the spine. There is mixed lytic and sclerotic metastasis involving the sternum. Review of the MIP images confirms the above findings. CT ABDOMEN and PELVIS FINDINGS No intra-abdominal free air or free fluid. Hepatobiliary: Probable background of fatty infiltration of the liver. There has been interval increase in the size of the hypoenhancing lesion in the inferior right lobe of the liver (series 2, image 25) now measuring up to 2.1 cm in greatest dimension (previously approximately 7 mm). Several additional smaller hypodense lesions in segment VII appear new since the prior CT. The gallbladder is unremarkable. There is dilatation of the intrahepatic biliary tree, progressed since the prior CT. There is also dilatation of the common bile duct. The common bile duct measures approximately 10 mm in diameter, previously measured approximately 8 mm. Pancreas: There is a 3.5 x 2.4 cm hypoenhancing mass in the head/uncinate process of the pancreas (previously measuring approximately 3.8 x 2.1 cm). There is complete atrophy of the gland with dilatation of the main pancreatic duct as  seen previously. Spleen: Normal in size without focal abnormality. Adrenals/Urinary Tract: There is calcification of the medial limb of the left adrenal gland, likely sequela of prior insult. The right adrenal gland is unremarkable. There is no hydronephrosis on either side. There is symmetric enhancement and excretion of contrast by both kidneys. The visualized ureters and urinary bladder appear unremarkable. Stomach/Bowel: Postsurgical changes of sigmoid resection with anastomosis. There is a left anterior abdominal loop colostomy. Anastomotic suture is also noted in the right lower quadrant. There is mildly dilated and fecalized loop of bowel in the right hemipelvis which may represent a degree of obstruction. The appendix is normal. Vascular/Lymphatic: Mild aortoiliac atherosclerotic disease. An infrarenal IVC filter is noted. The SMV, splenic vein, and main portal vein are patent. No portal venous gas. Mildly enlarged portacaval lymph node measures approximately 11 mm in short axis (previously 7 mm). Additional mildly enlarged gastrohepatic lymph node measures approximately 11 mm in short axis. Reproductive: The prostate and seminal vesicles are grossly unremarkable. Other: None Musculoskeletal: There is degenerative changes of the spine. No acute osseous pathology. Review of the MIP images confirms the above findings. IMPRESSION: 1. No CT evidence of pulmonary artery embolus. 2. Mildly dilated and fecalized small bowel loop in the pelvis may represent a partial or early obstruction. Clinical correlation and follow-up recommended. 3. Overall progression of disease with progression of pulmonary and hepatic metastatic lesions. 4. Small right pleural effusion has increased in size since the prior CT. 5. No significant interval change in the size of the soft tissue mass in the head/uncinate process of the pancreas. 6. Worsened biliary ductal dilatation compared  to the prior CT. 7. Interval increase in the size of  the mixed lytic and sclerotic lesion involving the posterior right eleventh rib. 8. Aortic Atherosclerosis (ICD10-I70.0). Electronically Signed   By: Anner Crete M.D.   On: 07/16/2019 20:07   CT ABDOMEN PELVIS W CONTRAST  Result Date: 07/16/2019 CLINICAL DATA:  69 year old male with right lower quadrant abdominal pain radiating to the flank area as well as chest pain. History of metastatic rectal cancer. EXAM: CT ANGIOGRAPHY CHEST CT ABDOMEN AND PELVIS WITH CONTRAST TECHNIQUE: Multidetector CT imaging of the chest was performed using the standard protocol during bolus administration of intravenous contrast. Multiplanar CT image reconstructions and MIPs were obtained to evaluate the vascular anatomy. Multidetector CT imaging of the abdomen and pelvis was performed using the standard protocol during bolus administration of intravenous contrast. CONTRAST:  134mL OMNIPAQUE IOHEXOL 350 MG/ML SOLN COMPARISON:  CT of the chest abdomen pelvis dated 04/19/2019. FINDINGS: CTA CHEST FINDINGS Cardiovascular: There is no cardiomegaly or pericardial effusion. There is 3 vessel coronary vascular calcification. Mild atherosclerotic calcification of the thoracic aorta. No aneurysmal dilatation or dissection. No pulmonary artery embolus identified. Right-sided Port-A-Cath with tip in the region of the cavoatrial junction. Mediastinum/Nodes: Mildly enlarged right hilar lymph node measures 11 mm similar to prior CT. No new adenopathy. The esophagus and the thyroid gland are grossly unremarkable. No mediastinal fluid collection. Lungs/Pleura: There is a 1 cm right upper lobe nodule along the major fissure (series 7, image 65), similar to prior CT. Additional right upper lobe subpleural nodule (series 7, image 34) measures approximately 13 mm (previously 11 mm). A 4 mm nodule at the right lung base (series 7, image 118) previously measured 2 mm. A 5 mm nodule at the right lung base (series 7, image 125) previously measured 2 mm.  There is an area of scarring in the right middle lobe inferiorly. There is a small right pleural effusion which has increased in size since the prior CT. There is no pneumothorax. The central airways are patent. Musculoskeletal: There has been increase in the size of the mixed lytic and sclerotic metastatic disease involving the posterior right eleventh rib. There is degenerative changes of the spine. There is mixed lytic and sclerotic metastasis involving the sternum. Review of the MIP images confirms the above findings. CT ABDOMEN and PELVIS FINDINGS No intra-abdominal free air or free fluid. Hepatobiliary: Probable background of fatty infiltration of the liver. There has been interval increase in the size of the hypoenhancing lesion in the inferior right lobe of the liver (series 2, image 25) now measuring up to 2.1 cm in greatest dimension (previously approximately 7 mm). Several additional smaller hypodense lesions in segment VII appear new since the prior CT. The gallbladder is unremarkable. There is dilatation of the intrahepatic biliary tree, progressed since the prior CT. There is also dilatation of the common bile duct. The common bile duct measures approximately 10 mm in diameter, previously measured approximately 8 mm. Pancreas: There is a 3.5 x 2.4 cm hypoenhancing mass in the head/uncinate process of the pancreas (previously measuring approximately 3.8 x 2.1 cm). There is complete atrophy of the gland with dilatation of the main pancreatic duct as seen previously. Spleen: Normal in size without focal abnormality. Adrenals/Urinary Tract: There is calcification of the medial limb of the left adrenal gland, likely sequela of prior insult. The right adrenal gland is unremarkable. There is no hydronephrosis on either side. There is symmetric enhancement and excretion of contrast by both kidneys. The  visualized ureters and urinary bladder appear unremarkable. Stomach/Bowel: Postsurgical changes of sigmoid  resection with anastomosis. There is a left anterior abdominal loop colostomy. Anastomotic suture is also noted in the right lower quadrant. There is mildly dilated and fecalized loop of bowel in the right hemipelvis which may represent a degree of obstruction. The appendix is normal. Vascular/Lymphatic: Mild aortoiliac atherosclerotic disease. An infrarenal IVC filter is noted. The SMV, splenic vein, and main portal vein are patent. No portal venous gas. Mildly enlarged portacaval lymph node measures approximately 11 mm in short axis (previously 7 mm). Additional mildly enlarged gastrohepatic lymph node measures approximately 11 mm in short axis. Reproductive: The prostate and seminal vesicles are grossly unremarkable. Other: None Musculoskeletal: There is degenerative changes of the spine. No acute osseous pathology. Review of the MIP images confirms the above findings. IMPRESSION: 1. No CT evidence of pulmonary artery embolus. 2. Mildly dilated and fecalized small bowel loop in the pelvis may represent a partial or early obstruction. Clinical correlation and follow-up recommended. 3. Overall progression of disease with progression of pulmonary and hepatic metastatic lesions. 4. Small right pleural effusion has increased in size since the prior CT. 5. No significant interval change in the size of the soft tissue mass in the head/uncinate process of the pancreas. 6. Worsened biliary ductal dilatation compared to the prior CT. 7. Interval increase in the size of the mixed lytic and sclerotic lesion involving the posterior right eleventh rib. 8. Aortic Atherosclerosis (ICD10-I70.0). Electronically Signed   By: Anner Crete M.D.   On: 07/16/2019 20:07   DG Chest Portable 1 View  Result Date: 07/16/2019 CLINICAL DATA:  Chest pain, rectal cancer EXAM: PORTABLE CHEST 1 VIEW COMPARISON:  11/25/2018 chest radiograph. FINDINGS: Right subclavian Port-A-Cath terminates in the lower third of the SVC. Stable  cardiomediastinal silhouette with normal heart size. No pneumothorax. No pleural effusion. Pulmonary nodules scattered in the mid and upper right lung, not definitely changed. No pulmonary edema. No acute consolidative airspace disease. IMPRESSION: 1. No acute cardiopulmonary disease. 2. Right pulmonary nodules, not definitely changed by chest radiographs. Electronically Signed   By: Ilona Sorrel M.D.   On: 07/16/2019 19:32   US Abdomen Limited RUQ  Result Date: 07/17/2019 CLINICAL DATA:  Abdominal pain EXAM: ULTRASOUND ABDOMEN LIMITED RIGHT UPPER QUADRANT COMPARISON:  Abdominal CT from yesterday FINDINGS: Gallbladder: Sludge and small gallstones. Wall thickness is 5 mm but there is no focal tenderness. Common bile duct: Diameter: 13 mm Liver: Intrahepatic bile duct dilatation. Liver lesions by CT are not clearly seen on this study, limited by bowel gas. Portal vein is patent on color Doppler imaging with normal direction of blood flow towards the liver. IMPRESSION: 1. Cholelithiasis and sludge. There is mild gallbladder wall thickening but no over distension or focal tenderness typical of acute cholecystitis. 2. Intra and extrahepatic bile duct dilatation in the setting of known pancreas mass. Electronically Signed   By: Monte Fantasia M.D.   On: 07/17/2019 10:36     Assessment & Plan:  Charles [name] is a 69 y.o. male with obstructive jaundice after stent dislodged in the setting of metastatic rectal cancer. He has some fecalized small intestine but has no clinical signs of obstruction and has been having output and tolerating food.  This is likely from slow transit and is chronic.    -Diet as tolerated  -Dr. Laural Golden doing ERCP tomorrow -No surgical intervention needed, gallbladder with stones and no cholecystitis   All questions were answered to the  satisfaction of the patient and family.   Virl Cagey 07/17/2019, 2:16 PM

## 2019-07-17 NOTE — Progress Notes (Signed)
CRITICAL VALUE ALERT  Critical Value:  Glucose 43  Date & Time Notied:  07/17/2019 1338  Provider Notified: Dr Carles Collet  Orders Received/Actions taken: CBG at 1121 was 91. No new orders at this time

## 2019-07-17 NOTE — Progress Notes (Signed)
PROGRESS NOTE  Charles [name] BBC:[id number] DOB: 1951-04-16 DOA: 07/16/2019 PCP: [name], MD  Brief History82  69 year old male with a history of stage IV rectal cancer with metastasis to the pancreas and lung, DVT/PE status post IVC filter, diabetes mellitus type 2, depression/anxiety, peripheral vascular disease presenting with 2-day history of worsening right lower quadrant abdominal pain.  Patient states that he has had intermittent right lower quadrant abdominal pain for the better part of a month.  He has seen soft stool as well as formed stool in his ostomy even in the last 24 to 48 hours.  He had 3 episodes of nausea and vomiting on 07/16/2019 which prompted him to seek medical care.  He denies any changes in his medications.  He denies any fevers, chills, hematemesis, shortness of breath, hemoptysis, hematochezia, hematuria.  He has had some intermittent right-sided chest pain, but he states that this has been chronic over the past couple months. In the emergency department, the patient was afebrile hemodynamically stable although he was mildly tachycardic in the 110s.  Oxygen saturation was 98% on room air.  CT of the abdomen and pelvis showed mild dilated and fecalized small bowel loops in the pelvis.  There was progression of his pulmonary and hepatic metastasis with worsening biliary ductal dilatation.  CTA chest was negative for PE but showed right-sided pulmonary nodules.  There was lytic and sclerotic metastatic disease in the right posterior 11th rib as well as the sternum.  BMP showed sodium 133.  LFTs were elevated with AST 276, ALT 293, alk phosphatase 596, total bilirubin 4.8.  Lipase 11.  WBC 13.4 with hemoglobin 11.2, platelets 208,000.  Assessment/Plan: Intractable abdominal pain with nausea and vomiting -Concern for early PSBO -General surgery consulted -Continue IV Dilaudid as needed severe pain  Biliary ductal dilatation with hyperbilirubinemia -Secondary  to pancreatic head mass -GI consult -CT abdomen and pelvis showed some worsening bili ductal dilatation -09/16/2018 ERCP--biliary sphincterotomy with metal stent placement  Stage IV rectal cancer -Last chemotherapy approximately 1 month ago -Status post 4 cycles FOLFIRI 1/16-2/11/21 -status post proctostomy and transverse loop colostomy--11/2010 -follow Dr. Ruffin Pyo -due to pancreatic head mass and hepatic mets -trend -GI consult  Neoplasm related pain -Continue home dose Percocet -IV Dilaudid as needed pain breakthrough -Continue gabapentin  Chest pain -Troponins negative -Personally reviewed EKG--without concerning ST-T changes -CTA chest negative for PE, as described above  DVT/PE -Status post IVC filter 2016 -Continue rivaroxaban  Diabetes mellitus type 2 -Allow for liberal glycemic control at this point -Hemoglobin A1c -Holding glimepiride  Depression/anxiety -Continue clonazepam and Remeron     Disposition Plan: Patient From: Home D/C Place: Home 1-2 Barriers: Not Clinically Stable--GI/surgery eval; diet tolerance  Family Communication:  No Family at bedside  Consultants:  GI, general surgery  Code Status:  FULL   DVT Prophylaxis:  xarelto   Procedures: As Listed in Progress Note Above  Antibiotics: None       Subjective: Pt states abd pain is a little better.  Denies f/c, ,sob, n/v.  Making stool in ostomy.  Tolerated clears this am.  CP is controlled  Objective: Vitals:   07/16/19 2230 07/16/19 2324 07/16/19 2329 07/17/19 0506  BP: 138/71  (!) 117/102 127/78  Pulse: 80  (!) 102 (!) 102  Resp: '19  20 17  ' Temp:   98.1 F (36.7 C) 98.8 F (37.1 C)  TempSrc:   Oral Oral  SpO2:  96%  98% 98%  Weight:  71.9 kg    Height:  '6\' 2"'  (1.88 m)      Intake/Output Summary (Last 24 hours) at 07/17/2019 0833 Last data filed at 07/17/2019 0300 Gross per 24 hour  Intake 555.86 ml  Output 120 ml  Net 435.86 ml   Weight  change:  Exam:   General:  Pt is alert, follows commands appropriately, not in acute distress  HEENT: No icterus, No thrush, No neck mass, [place]/AT  Cardiovascular: RRR, S1/S2, no rubs, no gallops  Respiratory: CTA bilaterally, no wheezing, no crackles, no rhonchi  Abdomen: Soft/+BS, non tender, non distended, no guarding+ostomy with soft stool  Extremities: No edema, No lymphangitis, No petechiae, No rashes, no synovitis   Data Reviewed: I have personally reviewed following labs and imaging studies Basic Metabolic Panel: Recent Labs  Lab 07/11/19 0805 07/16/19 1859  NA 137 133*  K 4.2 3.6  CL 104 101  CO2 26 22  GLUCOSE 290* 183*  BUN 8 10  CREATININE 0.86 0.65  CALCIUM 8.3* 8.3*  MG 1.6*  --    Liver Function Tests: Recent Labs  Lab 07/11/19 0805 07/16/19 1859  AST 242* 276*  ALT 188* 293*  ALKPHOS 391* 596*  BILITOT 1.0 4.8*  PROT 5.4* 5.6*  ALBUMIN 2.9* 2.9*   Recent Labs  Lab 07/16/19 1859  LIPASE 11   No results for input(s): AMMONIA in the last 168 hours. Coagulation Profile: No results for input(s): INR, PROTIME in the last 168 hours. CBC: Recent Labs  Lab 07/11/19 0805 07/16/19 1859  WBC 9.4 13.4*  NEUTROABS 6.6 10.8*  HGB 12.0* 11.2*  HCT 38.3* 34.6*  MCV 89.3 86.9  PLT 317 308   Cardiac Enzymes: No results for input(s): CKTOTAL, CKMB, CKMBINDEX, TROPONINI in the last 168 hours. BNP: Invalid input(s): POCBNP CBG: Recent Labs  Lab 07/17/19 0756  GLUCAP 75   HbA1C: No results for input(s): HGBA1C in the last 72 hours. Urine analysis:    Component Value Date/Time   COLORURINE AMBER (A) 07/16/2019 2318   APPEARANCEUR CLEAR 07/16/2019 2318   LABSPEC >1.046 (H) 07/16/2019 2318   PHURINE 8.0 07/16/2019 2318   GLUCOSEU NEGATIVE 07/16/2019 2318   HGBUR NEGATIVE 07/16/2019 2318   [place] NEGATIVE 07/16/2019 2318   KETONESUR NEGATIVE 07/16/2019 2318   PROTEINUR NEGATIVE 07/16/2019 2318   UROBILINOGEN 1.0 11/14/2014 1504    NITRITE NEGATIVE 07/16/2019 2318   LEUKOCYTESUR NEGATIVE 07/16/2019 2318   Sepsis Labs: '@LABRCNTIP' (procalcitonin:4,lacticidven:4) )No results found for this or any previous visit (from the past 240 hour(s)).   Scheduled Meds: . dronabinol  5 mg Oral BID AC  . gabapentin  900 mg Oral BID  . insulin aspart  0-15 Units Subcutaneous TID WC  . insulin aspart  0-5 Units Subcutaneous QHS  . mirtazapine  7.5 mg Oral QHS  . pantoprazole  40 mg Oral QPM  . rivaroxaban  20 mg Oral Q supper   Continuous Infusions: . sodium chloride 100 mL/hr at 07/16/19 2349    Procedures/Studies: CT Angio Chest PE W and/or Wo Contrast  Result Date: 07/16/2019 CLINICAL DATA:  69 year old male with right lower quadrant abdominal pain radiating to the flank area as well as chest pain. History of metastatic rectal cancer. EXAM: CT ANGIOGRAPHY CHEST CT ABDOMEN AND PELVIS WITH CONTRAST TECHNIQUE: Multidetector CT imaging of the chest was performed using the standard protocol during bolus administration of intravenous contrast. Multiplanar CT image reconstructions and MIPs were obtained to evaluate the vascular anatomy.  Multidetector CT imaging of the abdomen and pelvis was performed using the standard protocol during bolus administration of intravenous contrast. CONTRAST:  143m OMNIPAQUE IOHEXOL 350 MG/ML SOLN COMPARISON:  CT of the chest abdomen pelvis dated 04/19/2019. FINDINGS: CTA CHEST FINDINGS Cardiovascular: There is no cardiomegaly or pericardial effusion. There is 3 vessel coronary vascular calcification. Mild atherosclerotic calcification of the thoracic aorta. No aneurysmal dilatation or dissection. No pulmonary artery embolus identified. Right-sided Port-A-Cath with tip in the region of the cavoatrial junction. Mediastinum/Nodes: Mildly enlarged right hilar lymph node measures 11 mm similar to prior CT. No new adenopathy. The esophagus and the thyroid gland are grossly unremarkable. No mediastinal fluid  collection. Lungs/Pleura: There is a 1 cm right upper lobe nodule along the major fissure (series 7, image 65), similar to prior CT. Additional right upper lobe subpleural nodule (series 7, image 34) measures approximately 13 mm (previously 11 mm). A 4 mm nodule at the right lung base (series 7, image 118) previously measured 2 mm. A 5 mm nodule at the right lung base (series 7, image 125) previously measured 2 mm. There is an area of scarring in the right middle lobe inferiorly. There is a small right pleural effusion which has increased in size since the prior CT. There is no pneumothorax. The central airways are patent. Musculoskeletal: There has been increase in the size of the mixed lytic and sclerotic metastatic disease involving the posterior right eleventh rib. There is degenerative changes of the spine. There is mixed lytic and sclerotic metastasis involving the sternum. Review of the MIP images confirms the above findings. CT ABDOMEN and PELVIS FINDINGS No intra-abdominal free air or free fluid. Hepatobiliary: Probable background of fatty infiltration of the liver. There has been interval increase in the size of the hypoenhancing lesion in the inferior right lobe of the liver (series 2, image 25) now measuring up to 2.1 cm in greatest dimension (previously approximately 7 mm). Several additional smaller hypodense lesions in segment VII appear new since the prior CT. The gallbladder is unremarkable. There is dilatation of the intrahepatic biliary tree, progressed since the prior CT. There is also dilatation of the common bile duct. The common bile duct measures approximately 10 mm in diameter, previously measured approximately 8 mm. Pancreas: There is a 3.5 x 2.4 cm hypoenhancing mass in the head/uncinate process of the pancreas (previously measuring approximately 3.8 x 2.1 cm). There is complete atrophy of the gland with dilatation of the main pancreatic duct as seen previously. Spleen: Normal in size  without focal abnormality. Adrenals/Urinary Tract: There is calcification of the medial limb of the left adrenal gland, likely sequela of prior insult. The right adrenal gland is unremarkable. There is no hydronephrosis on either side. There is symmetric enhancement and excretion of contrast by both kidneys. The visualized ureters and urinary bladder appear unremarkable. Stomach/Bowel: Postsurgical changes of sigmoid resection with anastomosis. There is a left anterior abdominal loop colostomy. Anastomotic suture is also noted in the right lower quadrant. There is mildly dilated and fecalized loop of bowel in the right hemipelvis which may represent a degree of obstruction. The appendix is normal. Vascular/Lymphatic: Mild aortoiliac atherosclerotic disease. An infrarenal IVC filter is noted. The SMV, splenic vein, and main portal vein are patent. No portal venous gas. Mildly enlarged portacaval lymph node measures approximately 11 mm in short axis (previously 7 mm). Additional mildly enlarged gastrohepatic lymph node measures approximately 11 mm in short axis. Reproductive: The prostate and seminal vesicles are grossly unremarkable. Other:  None Musculoskeletal: There is degenerative changes of the spine. No acute osseous pathology. Review of the MIP images confirms the above findings. IMPRESSION: 1. No CT evidence of pulmonary artery embolus. 2. Mildly dilated and fecalized small bowel loop in the pelvis may represent a partial or early obstruction. Clinical correlation and follow-up recommended. 3. Overall progression of disease with progression of pulmonary and hepatic metastatic lesions. 4. Small right pleural effusion has increased in size since the prior CT. 5. No significant interval change in the size of the soft tissue mass in the head/uncinate process of the pancreas. 6. Worsened biliary ductal dilatation compared to the prior CT. 7. Interval increase in the size of the mixed lytic and sclerotic lesion  involving the posterior right eleventh rib. 8. Aortic Atherosclerosis (ICD10-I70.0). Electronically Signed   By: Anner Crete M.D.   On: 07/16/2019 20:07   CT ABDOMEN PELVIS W CONTRAST  Result Date: 07/16/2019 CLINICAL DATA:  69 year old male with right lower quadrant abdominal pain radiating to the flank area as well as chest pain. History of metastatic rectal cancer. EXAM: CT ANGIOGRAPHY CHEST CT ABDOMEN AND PELVIS WITH CONTRAST TECHNIQUE: Multidetector CT imaging of the chest was performed using the standard protocol during bolus administration of intravenous contrast. Multiplanar CT image reconstructions and MIPs were obtained to evaluate the vascular anatomy. Multidetector CT imaging of the abdomen and pelvis was performed using the standard protocol during bolus administration of intravenous contrast. CONTRAST:  172m OMNIPAQUE IOHEXOL 350 MG/ML SOLN COMPARISON:  CT of the chest abdomen pelvis dated 04/19/2019. FINDINGS: CTA CHEST FINDINGS Cardiovascular: There is no cardiomegaly or pericardial effusion. There is 3 vessel coronary vascular calcification. Mild atherosclerotic calcification of the thoracic aorta. No aneurysmal dilatation or dissection. No pulmonary artery embolus identified. Right-sided Port-A-Cath with tip in the region of the cavoatrial junction. Mediastinum/Nodes: Mildly enlarged right hilar lymph node measures 11 mm similar to prior CT. No new adenopathy. The esophagus and the thyroid gland are grossly unremarkable. No mediastinal fluid collection. Lungs/Pleura: There is a 1 cm right upper lobe nodule along the major fissure (series 7, image 65), similar to prior CT. Additional right upper lobe subpleural nodule (series 7, image 34) measures approximately 13 mm (previously 11 mm). A 4 mm nodule at the right lung base (series 7, image 118) previously measured 2 mm. A 5 mm nodule at the right lung base (series 7, image 125) previously measured 2 mm. There is an area of scarring in the  right middle lobe inferiorly. There is a small right pleural effusion which has increased in size since the prior CT. There is no pneumothorax. The central airways are patent. Musculoskeletal: There has been increase in the size of the mixed lytic and sclerotic metastatic disease involving the posterior right eleventh rib. There is degenerative changes of the spine. There is mixed lytic and sclerotic metastasis involving the sternum. Review of the MIP images confirms the above findings. CT ABDOMEN and PELVIS FINDINGS No intra-abdominal free air or free fluid. Hepatobiliary: Probable background of fatty infiltration of the liver. There has been interval increase in the size of the hypoenhancing lesion in the inferior right lobe of the liver (series 2, image 25) now measuring up to 2.1 cm in greatest dimension (previously approximately 7 mm). Several additional smaller hypodense lesions in segment VII appear new since the prior CT. The gallbladder is unremarkable. There is dilatation of the intrahepatic biliary tree, progressed since the prior CT. There is also dilatation of the common bile duct. The  common bile duct measures approximately 10 mm in diameter, previously measured approximately 8 mm. Pancreas: There is a 3.5 x 2.4 cm hypoenhancing mass in the head/uncinate process of the pancreas (previously measuring approximately 3.8 x 2.1 cm). There is complete atrophy of the gland with dilatation of the main pancreatic duct as seen previously. Spleen: Normal in size without focal abnormality. Adrenals/Urinary Tract: There is calcification of the medial limb of the left adrenal gland, likely sequela of prior insult. The right adrenal gland is unremarkable. There is no hydronephrosis on either side. There is symmetric enhancement and excretion of contrast by both kidneys. The visualized ureters and urinary bladder appear unremarkable. Stomach/Bowel: Postsurgical changes of sigmoid resection with anastomosis. There is a  left anterior abdominal loop colostomy. Anastomotic suture is also noted in the right lower quadrant. There is mildly dilated and fecalized loop of bowel in the right hemipelvis which may represent a degree of obstruction. The appendix is normal. Vascular/Lymphatic: Mild aortoiliac atherosclerotic disease. An infrarenal IVC filter is noted. The SMV, splenic vein, and main portal vein are patent. No portal venous gas. Mildly enlarged portacaval lymph node measures approximately 11 mm in short axis (previously 7 mm). Additional mildly enlarged gastrohepatic lymph node measures approximately 11 mm in short axis. Reproductive: The prostate and seminal vesicles are grossly unremarkable. Other: None Musculoskeletal: There is degenerative changes of the spine. No acute osseous pathology. Review of the MIP images confirms the above findings. IMPRESSION: 1. No CT evidence of pulmonary artery embolus. 2. Mildly dilated and fecalized small bowel loop in the pelvis may represent a partial or early obstruction. Clinical correlation and follow-up recommended. 3. Overall progression of disease with progression of pulmonary and hepatic metastatic lesions. 4. Small right pleural effusion has increased in size since the prior CT. 5. No significant interval change in the size of the soft tissue mass in the head/uncinate process of the pancreas. 6. Worsened biliary ductal dilatation compared to the prior CT. 7. Interval increase in the size of the mixed lytic and sclerotic lesion involving the posterior right eleventh rib. 8. Aortic Atherosclerosis (ICD10-I70.0). Electronically Signed   By: Anner Crete M.D.   On: 07/16/2019 20:07   DG Chest Portable 1 View  Result Date: 07/16/2019 CLINICAL DATA:  Chest pain, rectal cancer EXAM: PORTABLE CHEST 1 VIEW COMPARISON:  11/25/2018 chest radiograph. FINDINGS: Right subclavian Port-A-Cath terminates in the lower third of the SVC. Stable cardiomediastinal silhouette with normal heart  size. No pneumothorax. No pleural effusion. Pulmonary nodules scattered in the mid and upper right lung, not definitely changed. No pulmonary edema. No acute consolidative airspace disease. IMPRESSION: 1. No acute cardiopulmonary disease. 2. Right pulmonary nodules, not definitely changed by chest radiographs. Electronically Signed   By: Ilona Sorrel M.D.   On: 07/16/2019 19:32    Orson Eva, DO  [facility]  If 7PM-7AM, please contact night-coverage [email or web address] Password [facility] 07/17/2019, 8:33 AM   LOS: 0 days

## 2019-07-18 ENCOUNTER — Inpatient Hospital Stay (HOSPITAL_COMMUNITY): Payer: Medicare Other

## 2019-07-18 ENCOUNTER — Encounter (HOSPITAL_COMMUNITY)
Admission: EM | Disposition: A | Discharge status: Home / Self Care | Payer: Self-pay | Source: Home / Self Care | Attending: Internal Medicine

## 2019-07-18 ENCOUNTER — Other Ambulatory Visit (HOSPITAL_COMMUNITY): Payer: Medicare Other

## 2019-07-18 ENCOUNTER — Inpatient Hospital Stay (HOSPITAL_COMMUNITY): Payer: Medicare Other | Admitting: Anesthesiology

## 2019-07-18 ENCOUNTER — Encounter (HOSPITAL_COMMUNITY): Payer: Self-pay | Admitting: Internal Medicine

## 2019-07-18 ENCOUNTER — Ambulatory Visit (HOSPITAL_COMMUNITY): Payer: Medicare Other

## 2019-07-18 ENCOUNTER — Ambulatory Visit (HOSPITAL_COMMUNITY): Payer: Medicare Other | Admitting: Hematology

## 2019-07-18 DIAGNOSIS — C787 Secondary malignant neoplasm of liver and intrahepatic bile duct: Secondary | ICD-10-CM

## 2019-07-18 HISTORY — PX: BILIARY STENT PLACEMENT: SHX5538

## 2019-07-18 HISTORY — PX: ERCP: SHX5425

## 2019-07-18 LAB — CBC
HCT: 31.4 % — ABNORMAL LOW (ref 39.0–52.0)
Hemoglobin: 9.7 g/dL — ABNORMAL LOW (ref 13.0–17.0)
MCH: 27.8 pg (ref 26.0–34.0)
MCHC: 30.9 g/dL (ref 30.0–36.0)
MCV: 90 fL (ref 80.0–100.0)
Platelets: 246 10*3/uL (ref 150–400)
RBC: 3.49 MIL/uL — ABNORMAL LOW (ref 4.22–5.81)
RDW: 17.7 % — ABNORMAL HIGH (ref 11.5–15.5)
WBC: 7.7 10*3/uL (ref 4.0–10.5)
nRBC: 0 % (ref 0.0–0.2)

## 2019-07-18 LAB — COMPREHENSIVE METABOLIC PANEL
ALT: 199 U/L — ABNORMAL HIGH (ref 0–44)
AST: 167 U/L — ABNORMAL HIGH (ref 15–41)
Albumin: 2.3 g/dL — ABNORMAL LOW (ref 3.5–5.0)
Alkaline Phosphatase: 465 U/L — ABNORMAL HIGH (ref 38–126)
Anion gap: 7 (ref 5–15)
BUN: 6 mg/dL — ABNORMAL LOW (ref 8–23)
CO2: 25 mmol/L (ref 22–32)
Calcium: 8.1 mg/dL — ABNORMAL LOW (ref 8.9–10.3)
Chloride: 105 mmol/L (ref 98–111)
Creatinine, Ser: 0.58 mg/dL — ABNORMAL LOW (ref 0.61–1.24)
GFR calc Af Amer: >60 mL/min (ref >60)
GFR calc non Af Amer: >60 mL/min (ref >60)
Glucose, Bld: 94 mg/dL (ref 70–99)
Potassium: 3.7 mmol/L (ref 3.5–5.1)
Sodium: 137 mmol/L (ref 135–145)
Total Bilirubin: 4.3 mg/dL — ABNORMAL HIGH (ref 0.3–1.2)
Total Protein: 4.8 g/dL — ABNORMAL LOW (ref 6.5–8.1)

## 2019-07-18 LAB — GLUCOSE, CAPILLARY
Glucose-Capillary: 97 mg/dL (ref 70–99)
Glucose-Capillary: 91 mg/dL (ref 70–99)
Glucose-Capillary: 98 mg/dL (ref 70–99)
Glucose-Capillary: 90 mg/dL (ref 70–99)
Glucose-Capillary: 125 mg/dL — ABNORMAL HIGH (ref 70–99)

## 2019-07-18 LAB — MRSA PCR SCREENING: MRSA by PCR: NEGATIVE

## 2019-07-18 LAB — URINE CULTURE: Culture: NO GROWTH

## 2019-07-18 SURGERY — ERCP, WITH INTERVENTION IF INDICATED
Anesthesia: General | Wound class: Clean Contaminated

## 2019-07-18 MED ORDER — MIDAZOLAM HCL 2 MG/2ML IJ SOLN
INTRAMUSCULAR | Status: AC
  Filled 2019-07-18: qty 2

## 2019-07-18 MED ORDER — FENTANYL CITRATE (PF) 100 MCG/2ML IJ SOLN: 25.0000 ug | INTRAMUSCULAR | Status: DC | PRN

## 2019-07-18 MED ORDER — FENTANYL CITRATE (PF) 100 MCG/2ML IJ SOLN
INTRAMUSCULAR | Status: DC | PRN
  Administered 2019-07-18 (×2): 50 ug via INTRAVENOUS

## 2019-07-18 MED ORDER — PROPOFOL 10 MG/ML IV BOLUS
INTRAVENOUS | Status: DC | PRN
  Administered 2019-07-18: 160 mg via INTRAVENOUS

## 2019-07-18 MED ORDER — FENTANYL CITRATE (PF) 100 MCG/2ML IJ SOLN
INTRAMUSCULAR | Status: AC
  Filled 2019-07-18: qty 2

## 2019-07-18 MED ORDER — GLUCAGON HCL RDNA (DIAGNOSTIC) 1 MG IJ SOLR
INTRAMUSCULAR | Status: AC
  Filled 2019-07-18: qty 2

## 2019-07-18 MED ORDER — SODIUM CHLORIDE 0.9 % IV SOLN
INTRAVENOUS | Status: DC | PRN
  Administered 2019-07-18: 15 mL

## 2019-07-18 MED ORDER — CIPROFLOXACIN HCL 500 MG PO TABS: 500.0000 mg | ORAL_TABLET | Freq: Two times a day (BID) | ORAL | 0 refills | Status: DC

## 2019-07-18 MED ORDER — SODIUM CHLORIDE 0.9 % IV SOLN
INTRAVENOUS | Status: AC
  Filled 2019-07-18: qty 50

## 2019-07-18 MED ORDER — WATER FOR IRRIGATION, STERILE IR SOLN
Status: DC | PRN
  Administered 2019-07-18: 1000 mL

## 2019-07-18 MED ORDER — LACTATED RINGERS IV SOLN: INTRAVENOUS | Status: DC | PRN

## 2019-07-18 MED ORDER — PROPOFOL 10 MG/ML IV BOLUS
INTRAVENOUS | Status: AC
  Filled 2019-07-18: qty 20

## 2019-07-18 MED ORDER — MIDAZOLAM HCL 5 MG/5ML IJ SOLN
INTRAMUSCULAR | Status: DC | PRN
  Administered 2019-07-18: 2 mg via INTRAVENOUS

## 2019-07-18 MED ORDER — ONDANSETRON HCL 4 MG/2ML IJ SOLN
INTRAMUSCULAR | Status: DC | PRN
  Administered 2019-07-18: 4 mg via INTRAVENOUS

## 2019-07-18 MED ORDER — LIDOCAINE HCL (CARDIAC) PF 50 MG/5ML IV SOSY
PREFILLED_SYRINGE | INTRAVENOUS | Status: DC | PRN
  Administered 2019-07-18: 50 mg via INTRAVENOUS
  Administered 2019-07-18: 160 mg via INTRAVENOUS

## 2019-07-18 MED ORDER — MEPERIDINE HCL 50 MG/ML IJ SOLN: 6.2500 mg | INTRAMUSCULAR | Status: DC | PRN

## 2019-07-18 MED ORDER — SUCCINYLCHOLINE 20MG/ML (10ML) SYRINGE FOR MEDFUSION PUMP - OPTIME
INTRAMUSCULAR | Status: DC | PRN
  Administered 2019-07-18: 120 mg via INTRAVENOUS

## 2019-07-18 MED ORDER — CIPROFLOXACIN HCL 250 MG PO TABS: 500.0000 mg | ORAL_TABLET | Freq: Two times a day (BID) | ORAL | Status: DC

## 2019-07-18 NOTE — Op Note (Signed)
[facility] Patient Name: Charles [name] Procedure Date: 07/18/2019 12:56 PM MRN: [id number] Date of Birth: 09-07-1950 Attending MD: Hildred Laser , MD CSN: [id number] Age: 69 Admit Type: Inpatient Procedure:                ERCP Indications:              Malignant stricture of the common bile duct                            secondary to metastatic colon carcinoma. Providers:                Hildred Laser, MD, Janeece Riggers, RN, Aram Candela Referring MD:             Orson Eva, DO and Dr. Derek Jack, MD Medicines:                General Anesthesia Complications:            No immediate complications. Estimated Blood Loss:     Estimated blood loss was minimal. Procedure:                Pre-Anesthesia Assessment:                           - Prior to the procedure, a History and Physical                            was performed, and patient medications and                            allergies were reviewed. The patient's tolerance of                            previous anesthesia was also reviewed. The risks                            and benefits of the procedure and the sedation                            options and risks were discussed with the patient.                            All questions were answered, and informed consent                            was obtained. Prior Anticoagulants: The patient                            last took Xarelto (rivaroxaban) 2 days prior to the                            procedure. ASA Grade Assessment: III - A patient                            with severe systemic disease. After reviewing the  risks and benefits, the patient was deemed in                            satisfactory condition to undergo the procedure.                           After obtaining informed consent, the scope was                            passed under direct vision. Some difficulty                            encountered as scope passed  acroos hypopharynx.                            Throughout the procedure, the patient's blood                            pressure, pulse, and oxygen saturations were                            monitored continuously. The [id number]                            scope was introduced through the mouth, and used to                            inject contrast into and used to inject contrast                            into the bile duct. The ERCP was accomplished                            without difficulty. The patient tolerated the                            procedure well. Scope In: 2:09:15 PM Scope Out: 2:33:37 PM Total Procedure Duration: 0 hours 24 minutes 22 seconds  Findings:      A scout film of the abdomen was obtained. An IVC filter was seen. The       esophagus was successfully intubated under direct vision. The scope was       advanced to a normal major papilla in the descending duodenum without       detailed examination of the pharynx, larynx and associated structures,       and upper GI tract. The upper GI tract was grossly normal. The bile duct       was deeply cannulated with the Hydratome sphincterotome. Contrast was       injected. I personally interpreted the bile duct images. There was brisk       flow of contrast through the ducts. Image quality was excellent.       Contrast extended to the biliary tree predominantly left system.. The       common bile duct and common hepatic duct were moderately dilated and       diffusely  dilated, secondary to known malignant stricture. One 10 mm by       60 cm covered metal stent was placed 5 cm into the common bile duct.       Bile and pus flowed through the stent. The stent was in good position. Impression:               - The common bile duct and common hepatic duct were                            moderately dilated secondary to malignant stricture.                           - Stricture length about 2 cm.                            - One covered metal stent was placed into the                            common bile duct.                           REF [id number]                           LOT [id number]                           [employer]. Moderate Sedation:      Per Anesthesia Care Recommendation:           - Avoid aspirin and nonsteroidal anti-inflammatory                            medicines today.                           - Diabetic (ADA) diet today.                           - Continue present medications.                           - Resume Xarelto (rivaroxaban) at prior dose                            tomorrow.                           - Cipro 500 mg po bid for 5 days.                           - Return to Oncology clinic. Procedure Code(s):        --- Professional ---                           [phone], Endoscopic retrograde  cholangiopancreatography (ERCP); with placement of                            endoscopic stent into biliary or pancreatic duct,                            including pre- and post-dilation and guide wire                            passage, when performed, including sphincterotomy,                            when performed, each stent Diagnosis Code(s):        --- Professional ---                           K83.1, Obstruction of bile duct CPT copyright 2019 [employer]. All rights reserved. The codes documented in this report are preliminary and upon coder review may  be revised to meet current compliance requirements. Hildred Laser, MD Hildred Laser, MD 07/18/2019 3:05:09 PM This report has been signed electronically. Number of Addenda: 0

## 2019-07-18 NOTE — Progress Notes (Addendum)
Brief ERCP note  Somewhat difficult passage of duodenoscope across hypopharynx facilitated by raising head end of the bed. Normal ampulla of water.  With small amount of dark bile distal to it. CBD cannulated with Hydra tome and 035 Hydra Jagwire. High-grade stricture at CBD with upstream dilation. Intrahepatic system partially filled. Fully covered 10 mm x 60 mm Wallstent placed across the stricture. There was flow of dark bile and mucopurulent material. Minimal bleeding noted on initial cannulation felt to be due to tumor. PD was not cannulated or filled with contrast.  Forensic psychologist. REF [id number] LOT [id number]  Patient tolerated procedure well.

## 2019-07-18 NOTE — Anesthesia Preprocedure Evaluation (Signed)
Anesthesia Evaluation  Patient identified by MRN, date of birth, ID band Patient awake    Reviewed: Allergy & Precautions, NPO status , Patient's Chart, lab work & pertinent test results  Airway Mallampati: II  TM Distance: >3 FB Neck ROM: Full    Dental no notable dental hx. (+) Chipped, Teeth Intact   Pulmonary pneumonia, resolved,  Lung cancer, Left lobectomy   Pulmonary exam normal breath sounds clear to auscultation       Cardiovascular Exercise Tolerance: Good hypertension, Pt. on medications + Peripheral Vascular Disease  Normal cardiovascular examI Rhythm:Regular Rate:Normal  H/o DVTs in past, h/o PE on blood thinners chronically  Was active without cardiac issues  Reports prostate Ca 2016 and Pancreas/lung CA in 2018    Neuro/Psych PSYCHIATRIC DISORDERS Depression  Neuromuscular disease    GI/Hepatic hiatal hernia, GERD  Medicated and Controlled,Liver metastasis  Rectal cancer   Endo/Other  negative endocrine ROSdiabetes, Well Controlled, Type 2, Oral Hypoglycemic Agents  Renal/GU negative Renal ROS  negative genitourinary   Musculoskeletal negative musculoskeletal ROS (+)   Abdominal   Peds negative pediatric ROS (+)  Hematology negative hematology ROS (+)   Anesthesia Other Findings   Reproductive/Obstetrics negative OB ROS                             Anesthesia Physical  Anesthesia Plan  ASA: IV  Anesthesia Plan: General   Post-op Pain Management:    Induction: Intravenous  PONV Risk Score and Plan: 3 and Ondansetron, Dexamethasone and Midazolam  Airway Management Planned: Oral ETT  Additional Equipment:   Intra-op Plan:   Post-operative Plan: Extubation in OR  Informed Consent: I have reviewed the patients History and Physical, chart, labs and discussed the procedure including the risks, benefits and alternatives for the proposed anesthesia with the  patient or authorized representative who has indicated his/her understanding and acceptance.     Dental advisory given  Plan Discussed with: CRNA  Anesthesia Plan Comments:         Anesthesia Quick Evaluation

## 2019-07-18 NOTE — Discharge Summary (Signed)
Physician Discharge Summary  Charles [name] OZY:[id number] DOB: 1950/07/07 DOA: 07/16/2019  PCP: [name], MD  Admit date: 07/16/2019 Discharge date: 07/18/2019  Admitted From: Home Disposition:  Home  Recommendations for Outpatient Follow-up:  1. Follow up with PCP in 1-2 weeks 2. Please obtain BMP/CBC in one week     Discharge Condition: Stable CODE STATUS:FULL Diet recommendation: Heart Healthy / Carb Modified   Brief/Interim Summary: 69 year old male with a history of stage IV rectal cancer with metastasis to the pancreas and lung, DVT/PE status post IVC filter, diabetes mellitus type 2, depression/anxiety, peripheral vascular disease presenting with 2-day history of worsening right lower quadrant abdominal pain.  Patient states that he has had intermittent right lower quadrant abdominal pain for the better part of a month.  He has seen soft stool as well as formed stool in his ostomy even in the last 24 to 48 hours.  He had 3 episodes of nausea and vomiting on 07/16/2019 which prompted him to seek medical care.  He denies any changes in his medications.  He denies any fevers, chills, hematemesis, shortness of breath, hemoptysis, hematochezia, hematuria.  He has had some intermittent right-sided chest pain, but he states that this has been chronic over the past couple months. In the emergency department, the patient was afebrile hemodynamically stable although he was mildly tachycardic in the 110s.  Oxygen saturation was 98% on room air.  CT of the abdomen and pelvis showed mild dilated and fecalized small bowel loops in the pelvis.  There was progression of his pulmonary and hepatic metastasis with worsening biliary ductal dilatation.  CTA chest was negative for PE but showed right-sided pulmonary nodules.  There was lytic and sclerotic metastatic disease in the right posterior 11th rib as well as the sternum.  BMP showed sodium 133.  LFTs were elevated with AST 276, ALT 293, alk  phosphatase 596, total bilirubin 4.8.  Lipase 11.  WBC 13.4 with hemoglobin 11.2, platelets 208,000.  GI and general surgery were consulted.  ERCP was done on 07/18/19  Discharge Diagnoses:  Intractable abdominal pain with nausea and vomiting -Concern for early PSBO -General surgery consult appreciated-->likely from slow transit and is chronic.  -advanced diet which pt tolerated -Continue IV Dilaudid as needed severe pain>>transitioned to home percocet  Biliary ductal dilatation with hyperbilirubinemia -Secondary to pancreatic head mass -GI consult-->ERCP -CT abdomen and pelvis showed some worsening bili ductal dilatation -09/16/2018 ERCP--biliary sphincterotomy with metal stent placement -07/18/19--ERCP--2 cm malignant stricture in CBD and common hepatic duct-->metal stent placed-->flow of dark bile and mucopurulent material -07/18/19--discussed with Dr. Levora Dredge to d/c with 5 days cipro -resume xarelto 07/19/19  Stage IV rectal cancer -Last chemotherapy approximately 1 month ago -Status post 4 cycles FOLFIRI 1/16-2/11/21 -status postproctostomy and transverse loop colostomy--11/2010 -follow Dr. Ruffin Pyo -due to pancreatic head mass and hepatic mets -trending down -RUQ US--cholelithiasis with mild GB wall thicken w/o focal tenderness -GI consult appreciated  Neoplasm related pain -Continue home dose Percocet -IV Dilaudid as needed pain breakthrough -Continue gabapentin  Chest pain -Troponins negative -Personally reviewed EKG--without concerning ST-T changes -CTA chest negative for PE, as described above  DVT/PE -Status post IVC filter 2016 -Continue rivaroxaban  Diabetes mellitus type 2 -Allow for liberal glycemic control at this point -07/17/19 Hemoglobin A1c--6.4 -Holding glimepiride  Depression/anxiety -Continue clonazepam and Remeron    Discharge Instructions   Allergies as of 07/18/2019      Reactions   Oxycodone    Blisters,  hallucinations  Tramadol    Blisters, hallucinations   Trazodone And Nefazodone Other (See Comments)   hallucinations      Medication List    TAKE these medications   acetaminophen 500 MG tablet Commonly known as: TYLENOL Take 1,000 mg by mouth every 6 (six) hours as needed for mild pain or moderate pain.   ciprofloxacin 500 MG tablet Commonly known as: CIPRO Take 1 tablet (500 mg total) by mouth 2 (two) times daily.   clonazePAM 1 MG tablet Commonly known as: KLONOPIN TAKE 1 TABLET BY MOUTH THREE TIMES DAILY AS NEEDED What changed: reasons to take this   gabapentin 300 MG capsule Commonly known as: NEURONTIN Take 3 capsules (900 mg total) by mouth 2 (two) times daily.   glimepiride 2 MG tablet Commonly known as: AMARYL Take 2 mg by mouth 2 (two) times daily before a meal.   magnesium oxide 400 (241.3 Mg) MG tablet Commonly known as: MAG-OX Take 1 tablet (400 mg total) by mouth 2 (two) times daily.   mirtazapine 15 MG tablet Commonly known as: REMERON Take 7.5 mg by mouth at bedtime.   oxyCODONE-acetaminophen 10-325 MG tablet Commonly known as: Percocet Take 1 tablet by mouth every 8 (eight) hours as needed for pain.   pantoprazole 40 MG tablet Commonly known as: PROTONIX Take 40 mg by mouth every evening.   PROBIOTIC DAILY PO Take 1 capsule by mouth 2 (two) times daily.   rivaroxaban 20 MG Tabs tablet Commonly known as: XARELTO Take 1 tablet (20 mg total) by mouth daily with supper.   vitamin B-12 1000 MCG tablet Commonly known as: CYANOCOBALAMIN Take 1,000 mcg by mouth daily.       Allergies  Allergen Reactions  . Oxycodone     Blisters, hallucinations  . Tramadol     Blisters, hallucinations  . Trazodone And Nefazodone Other (See Comments)    hallucinations    Consultations:  General surgery  GI   Procedures/Studies: CT Angio Chest PE W and/or Wo Contrast  Result Date: 07/16/2019 CLINICAL DATA:  69 year old male with right lower  quadrant abdominal pain radiating to the flank area as well as chest pain. History of metastatic rectal cancer. EXAM: CT ANGIOGRAPHY CHEST CT ABDOMEN AND PELVIS WITH CONTRAST TECHNIQUE: Multidetector CT imaging of the chest was performed using the standard protocol during bolus administration of intravenous contrast. Multiplanar CT image reconstructions and MIPs were obtained to evaluate the vascular anatomy. Multidetector CT imaging of the abdomen and pelvis was performed using the standard protocol during bolus administration of intravenous contrast. CONTRAST:  165m OMNIPAQUE IOHEXOL 350 MG/ML SOLN COMPARISON:  CT of the chest abdomen pelvis dated 04/19/2019. FINDINGS: CTA CHEST FINDINGS Cardiovascular: There is no cardiomegaly or pericardial effusion. There is 3 vessel coronary vascular calcification. Mild atherosclerotic calcification of the thoracic aorta. No aneurysmal dilatation or dissection. No pulmonary artery embolus identified. Right-sided Port-A-Cath with tip in the region of the cavoatrial junction. Mediastinum/Nodes: Mildly enlarged right hilar lymph node measures 11 mm similar to prior CT. No new adenopathy. The esophagus and the thyroid gland are grossly unremarkable. No mediastinal fluid collection. Lungs/Pleura: There is a 1 cm right upper lobe nodule along the major fissure (series 7, image 65), similar to prior CT. Additional right upper lobe subpleural nodule (series 7, image 34) measures approximately 13 mm (previously 11 mm). A 4 mm nodule at the right lung base (series 7, image 118) previously measured 2 mm. A 5 mm nodule at the right lung base (series 7, image  125) previously measured 2 mm. There is an area of scarring in the right middle lobe inferiorly. There is a small right pleural effusion which has increased in size since the prior CT. There is no pneumothorax. The central airways are patent. Musculoskeletal: There has been increase in the size of the mixed lytic and sclerotic  metastatic disease involving the posterior right eleventh rib. There is degenerative changes of the spine. There is mixed lytic and sclerotic metastasis involving the sternum. Review of the MIP images confirms the above findings. CT ABDOMEN and PELVIS FINDINGS No intra-abdominal free air or free fluid. Hepatobiliary: Probable background of fatty infiltration of the liver. There has been interval increase in the size of the hypoenhancing lesion in the inferior right lobe of the liver (series 2, image 25) now measuring up to 2.1 cm in greatest dimension (previously approximately 7 mm). Several additional smaller hypodense lesions in segment VII appear new since the prior CT. The gallbladder is unremarkable. There is dilatation of the intrahepatic biliary tree, progressed since the prior CT. There is also dilatation of the common bile duct. The common bile duct measures approximately 10 mm in diameter, previously measured approximately 8 mm. Pancreas: There is a 3.5 x 2.4 cm hypoenhancing mass in the head/uncinate process of the pancreas (previously measuring approximately 3.8 x 2.1 cm). There is complete atrophy of the gland with dilatation of the main pancreatic duct as seen previously. Spleen: Normal in size without focal abnormality. Adrenals/Urinary Tract: There is calcification of the medial limb of the left adrenal gland, likely sequela of prior insult. The right adrenal gland is unremarkable. There is no hydronephrosis on either side. There is symmetric enhancement and excretion of contrast by both kidneys. The visualized ureters and urinary bladder appear unremarkable. Stomach/Bowel: Postsurgical changes of sigmoid resection with anastomosis. There is a left anterior abdominal loop colostomy. Anastomotic suture is also noted in the right lower quadrant. There is mildly dilated and fecalized loop of bowel in the right hemipelvis which may represent a degree of obstruction. The appendix is normal.  Vascular/Lymphatic: Mild aortoiliac atherosclerotic disease. An infrarenal IVC filter is noted. The SMV, splenic vein, and main portal vein are patent. No portal venous gas. Mildly enlarged portacaval lymph node measures approximately 11 mm in short axis (previously 7 mm). Additional mildly enlarged gastrohepatic lymph node measures approximately 11 mm in short axis. Reproductive: The prostate and seminal vesicles are grossly unremarkable. Other: None Musculoskeletal: There is degenerative changes of the spine. No acute osseous pathology. Review of the MIP images confirms the above findings. IMPRESSION: 1. No CT evidence of pulmonary artery embolus. 2. Mildly dilated and fecalized small bowel loop in the pelvis may represent a partial or early obstruction. Clinical correlation and follow-up recommended. 3. Overall progression of disease with progression of pulmonary and hepatic metastatic lesions. 4. Small right pleural effusion has increased in size since the prior CT. 5. No significant interval change in the size of the soft tissue mass in the head/uncinate process of the pancreas. 6. Worsened biliary ductal dilatation compared to the prior CT. 7. Interval increase in the size of the mixed lytic and sclerotic lesion involving the posterior right eleventh rib. 8. Aortic Atherosclerosis (ICD10-I70.0). Electronically Signed   By: Anner Crete M.D.   On: 07/16/2019 20:07   CT ABDOMEN PELVIS W CONTRAST  Result Date: 07/16/2019 CLINICAL DATA:  69 year old male with right lower quadrant abdominal pain radiating to the flank area as well as chest pain. History of metastatic rectal  cancer. EXAM: CT ANGIOGRAPHY CHEST CT ABDOMEN AND PELVIS WITH CONTRAST TECHNIQUE: Multidetector CT imaging of the chest was performed using the standard protocol during bolus administration of intravenous contrast. Multiplanar CT image reconstructions and MIPs were obtained to evaluate the vascular anatomy. Multidetector CT imaging of  the abdomen and pelvis was performed using the standard protocol during bolus administration of intravenous contrast. CONTRAST:  138m OMNIPAQUE IOHEXOL 350 MG/ML SOLN COMPARISON:  CT of the chest abdomen pelvis dated 04/19/2019. FINDINGS: CTA CHEST FINDINGS Cardiovascular: There is no cardiomegaly or pericardial effusion. There is 3 vessel coronary vascular calcification. Mild atherosclerotic calcification of the thoracic aorta. No aneurysmal dilatation or dissection. No pulmonary artery embolus identified. Right-sided Port-A-Cath with tip in the region of the cavoatrial junction. Mediastinum/Nodes: Mildly enlarged right hilar lymph node measures 11 mm similar to prior CT. No new adenopathy. The esophagus and the thyroid gland are grossly unremarkable. No mediastinal fluid collection. Lungs/Pleura: There is a 1 cm right upper lobe nodule along the major fissure (series 7, image 65), similar to prior CT. Additional right upper lobe subpleural nodule (series 7, image 34) measures approximately 13 mm (previously 11 mm). A 4 mm nodule at the right lung base (series 7, image 118) previously measured 2 mm. A 5 mm nodule at the right lung base (series 7, image 125) previously measured 2 mm. There is an area of scarring in the right middle lobe inferiorly. There is a small right pleural effusion which has increased in size since the prior CT. There is no pneumothorax. The central airways are patent. Musculoskeletal: There has been increase in the size of the mixed lytic and sclerotic metastatic disease involving the posterior right eleventh rib. There is degenerative changes of the spine. There is mixed lytic and sclerotic metastasis involving the sternum. Review of the MIP images confirms the above findings. CT ABDOMEN and PELVIS FINDINGS No intra-abdominal free air or free fluid. Hepatobiliary: Probable background of fatty infiltration of the liver. There has been interval increase in the size of the hypoenhancing lesion  in the inferior right lobe of the liver (series 2, image 25) now measuring up to 2.1 cm in greatest dimension (previously approximately 7 mm). Several additional smaller hypodense lesions in segment VII appear new since the prior CT. The gallbladder is unremarkable. There is dilatation of the intrahepatic biliary tree, progressed since the prior CT. There is also dilatation of the common bile duct. The common bile duct measures approximately 10 mm in diameter, previously measured approximately 8 mm. Pancreas: There is a 3.5 x 2.4 cm hypoenhancing mass in the head/uncinate process of the pancreas (previously measuring approximately 3.8 x 2.1 cm). There is complete atrophy of the gland with dilatation of the main pancreatic duct as seen previously. Spleen: Normal in size without focal abnormality. Adrenals/Urinary Tract: There is calcification of the medial limb of the left adrenal gland, likely sequela of prior insult. The right adrenal gland is unremarkable. There is no hydronephrosis on either side. There is symmetric enhancement and excretion of contrast by both kidneys. The visualized ureters and urinary bladder appear unremarkable. Stomach/Bowel: Postsurgical changes of sigmoid resection with anastomosis. There is a left anterior abdominal loop colostomy. Anastomotic suture is also noted in the right lower quadrant. There is mildly dilated and fecalized loop of bowel in the right hemipelvis which may represent a degree of obstruction. The appendix is normal. Vascular/Lymphatic: Mild aortoiliac atherosclerotic disease. An infrarenal IVC filter is noted. The SMV, splenic vein, and main portal vein are  patent. No portal venous gas. Mildly enlarged portacaval lymph node measures approximately 11 mm in short axis (previously 7 mm). Additional mildly enlarged gastrohepatic lymph node measures approximately 11 mm in short axis. Reproductive: The prostate and seminal vesicles are grossly unremarkable. Other: None  Musculoskeletal: There is degenerative changes of the spine. No acute osseous pathology. Review of the MIP images confirms the above findings. IMPRESSION: 1. No CT evidence of pulmonary artery embolus. 2. Mildly dilated and fecalized small bowel loop in the pelvis may represent a partial or early obstruction. Clinical correlation and follow-up recommended. 3. Overall progression of disease with progression of pulmonary and hepatic metastatic lesions. 4. Small right pleural effusion has increased in size since the prior CT. 5. No significant interval change in the size of the soft tissue mass in the head/uncinate process of the pancreas. 6. Worsened biliary ductal dilatation compared to the prior CT. 7. Interval increase in the size of the mixed lytic and sclerotic lesion involving the posterior right eleventh rib. 8. Aortic Atherosclerosis (ICD10-I70.0). Electronically Signed   By: Anner Crete M.D.   On: 07/16/2019 20:07   DG Chest Portable 1 View  Result Date: 07/16/2019 CLINICAL DATA:  Chest pain, rectal cancer EXAM: PORTABLE CHEST 1 VIEW COMPARISON:  11/25/2018 chest radiograph. FINDINGS: Right subclavian Port-A-Cath terminates in the lower third of the SVC. Stable cardiomediastinal silhouette with normal heart size. No pneumothorax. No pleural effusion. Pulmonary nodules scattered in the mid and upper right lung, not definitely changed. No pulmonary edema. No acute consolidative airspace disease. IMPRESSION: 1. No acute cardiopulmonary disease. 2. Right pulmonary nodules, not definitely changed by chest radiographs. Electronically Signed   By: Ilona Sorrel M.D.   On: 07/16/2019 19:32   DG ERCP  Result Date: 07/18/2019 CLINICAL DATA:  History of metastatic disease. EXAM: ERCP TECHNIQUE: Multiple spot images obtained with the fluoroscopic device and submitted for interpretation post-procedure. FLUOROSCOPY TIME:  Fluoroscopy Time:  1 minutes 11 seconds COMPARISON:  CT 07/16/2019 FINDINGS: Visualization  of the IVC filter. Cannulation of the biliary duct and retrograde cholangiogram was performed. Obstruction involving the distal common bile duct. Placement of a metallic stent in the distal common bile duct. Patent cystic duct. IMPRESSION: Distal common bile duct obstruction and placement of a metallic biliary stent. These images were submitted for radiologic interpretation only. Please see the procedural report for the amount of contrast and the fluoroscopy time utilized. Electronically Signed   By: Markus Daft M.D.   On: 07/18/2019 15:19   US Abdomen Limited RUQ  Result Date: 07/17/2019 CLINICAL DATA:  Abdominal pain EXAM: ULTRASOUND ABDOMEN LIMITED RIGHT UPPER QUADRANT COMPARISON:  Abdominal CT from yesterday FINDINGS: Gallbladder: Sludge and small gallstones. Wall thickness is 5 mm but there is no focal tenderness. Common bile duct: Diameter: 13 mm Liver: Intrahepatic bile duct dilatation. Liver lesions by CT are not clearly seen on this study, limited by bowel gas. Portal vein is patent on color Doppler imaging with normal direction of blood flow towards the liver. IMPRESSION: 1. Cholelithiasis and sludge. There is mild gallbladder wall thickening but no over distension or focal tenderness typical of acute cholecystitis. 2. Intra and extrahepatic bile duct dilatation in the setting of known pancreas mass. Electronically Signed   By: Monte Fantasia M.D.   On: 07/17/2019 10:36         Discharge Exam: Vitals:   07/18/19 1530 07/18/19 1553  BP:  (!) 153/79  Pulse: 76 78  Resp: 14 16  Temp:  SpO2: 100% 100%   Vitals:   07/18/19 1500 07/18/19 1515 07/18/19 1530 07/18/19 1553  BP: (!) 143/78 (!) 146/78  (!) 153/79  Pulse: 60 75 76 78  Resp: '19 16 14 16  ' Temp: 98.1 F (36.7 C)     TempSrc:      SpO2: 98% 100% 100% 100%  Weight:      Height:        General: Pt is alert, awake, not in acute distress Cardiovascular: RRR, S1/S2 +, no rubs, no gallops Respiratory: mild bibasilar  crackles. No wheeze Abdominal: Soft, NT, ND, bowel sounds + Extremities: no edema, no cyanosis   The results of significant diagnostics from this hospitalization (including imaging, microbiology, ancillary and laboratory) are listed below for reference.    Significant Diagnostic Studies: CT Angio Chest PE W and/or Wo Contrast  Result Date: 07/16/2019 CLINICAL DATA:  69 year old male with right lower quadrant abdominal pain radiating to the flank area as well as chest pain. History of metastatic rectal cancer. EXAM: CT ANGIOGRAPHY CHEST CT ABDOMEN AND PELVIS WITH CONTRAST TECHNIQUE: Multidetector CT imaging of the chest was performed using the standard protocol during bolus administration of intravenous contrast. Multiplanar CT image reconstructions and MIPs were obtained to evaluate the vascular anatomy. Multidetector CT imaging of the abdomen and pelvis was performed using the standard protocol during bolus administration of intravenous contrast. CONTRAST:  158m OMNIPAQUE IOHEXOL 350 MG/ML SOLN COMPARISON:  CT of the chest abdomen pelvis dated 04/19/2019. FINDINGS: CTA CHEST FINDINGS Cardiovascular: There is no cardiomegaly or pericardial effusion. There is 3 vessel coronary vascular calcification. Mild atherosclerotic calcification of the thoracic aorta. No aneurysmal dilatation or dissection. No pulmonary artery embolus identified. Right-sided Port-A-Cath with tip in the region of the cavoatrial junction. Mediastinum/Nodes: Mildly enlarged right hilar lymph node measures 11 mm similar to prior CT. No new adenopathy. The esophagus and the thyroid gland are grossly unremarkable. No mediastinal fluid collection. Lungs/Pleura: There is a 1 cm right upper lobe nodule along the major fissure (series 7, image 65), similar to prior CT. Additional right upper lobe subpleural nodule (series 7, image 34) measures approximately 13 mm (previously 11 mm). A 4 mm nodule at the right lung base (series 7, image 118)  previously measured 2 mm. A 5 mm nodule at the right lung base (series 7, image 125) previously measured 2 mm. There is an area of scarring in the right middle lobe inferiorly. There is a small right pleural effusion which has increased in size since the prior CT. There is no pneumothorax. The central airways are patent. Musculoskeletal: There has been increase in the size of the mixed lytic and sclerotic metastatic disease involving the posterior right eleventh rib. There is degenerative changes of the spine. There is mixed lytic and sclerotic metastasis involving the sternum. Review of the MIP images confirms the above findings. CT ABDOMEN and PELVIS FINDINGS No intra-abdominal free air or free fluid. Hepatobiliary: Probable background of fatty infiltration of the liver. There has been interval increase in the size of the hypoenhancing lesion in the inferior right lobe of the liver (series 2, image 25) now measuring up to 2.1 cm in greatest dimension (previously approximately 7 mm). Several additional smaller hypodense lesions in segment VII appear new since the prior CT. The gallbladder is unremarkable. There is dilatation of the intrahepatic biliary tree, progressed since the prior CT. There is also dilatation of the common bile duct. The common bile duct measures approximately 10 mm in diameter, previously measured  approximately 8 mm. Pancreas: There is a 3.5 x 2.4 cm hypoenhancing mass in the head/uncinate process of the pancreas (previously measuring approximately 3.8 x 2.1 cm). There is complete atrophy of the gland with dilatation of the main pancreatic duct as seen previously. Spleen: Normal in size without focal abnormality. Adrenals/Urinary Tract: There is calcification of the medial limb of the left adrenal gland, likely sequela of prior insult. The right adrenal gland is unremarkable. There is no hydronephrosis on either side. There is symmetric enhancement and excretion of contrast by both kidneys.  The visualized ureters and urinary bladder appear unremarkable. Stomach/Bowel: Postsurgical changes of sigmoid resection with anastomosis. There is a left anterior abdominal loop colostomy. Anastomotic suture is also noted in the right lower quadrant. There is mildly dilated and fecalized loop of bowel in the right hemipelvis which may represent a degree of obstruction. The appendix is normal. Vascular/Lymphatic: Mild aortoiliac atherosclerotic disease. An infrarenal IVC filter is noted. The SMV, splenic vein, and main portal vein are patent. No portal venous gas. Mildly enlarged portacaval lymph node measures approximately 11 mm in short axis (previously 7 mm). Additional mildly enlarged gastrohepatic lymph node measures approximately 11 mm in short axis. Reproductive: The prostate and seminal vesicles are grossly unremarkable. Other: None Musculoskeletal: There is degenerative changes of the spine. No acute osseous pathology. Review of the MIP images confirms the above findings. IMPRESSION: 1. No CT evidence of pulmonary artery embolus. 2. Mildly dilated and fecalized small bowel loop in the pelvis may represent a partial or early obstruction. Clinical correlation and follow-up recommended. 3. Overall progression of disease with progression of pulmonary and hepatic metastatic lesions. 4. Small right pleural effusion has increased in size since the prior CT. 5. No significant interval change in the size of the soft tissue mass in the head/uncinate process of the pancreas. 6. Worsened biliary ductal dilatation compared to the prior CT. 7. Interval increase in the size of the mixed lytic and sclerotic lesion involving the posterior right eleventh rib. 8. Aortic Atherosclerosis (ICD10-I70.0). Electronically Signed   By: Anner Crete M.D.   On: 07/16/2019 20:07   CT ABDOMEN PELVIS W CONTRAST  Result Date: 07/16/2019 CLINICAL DATA:  69 year old male with right lower quadrant abdominal pain radiating to the flank  area as well as chest pain. History of metastatic rectal cancer. EXAM: CT ANGIOGRAPHY CHEST CT ABDOMEN AND PELVIS WITH CONTRAST TECHNIQUE: Multidetector CT imaging of the chest was performed using the standard protocol during bolus administration of intravenous contrast. Multiplanar CT image reconstructions and MIPs were obtained to evaluate the vascular anatomy. Multidetector CT imaging of the abdomen and pelvis was performed using the standard protocol during bolus administration of intravenous contrast. CONTRAST:  158m OMNIPAQUE IOHEXOL 350 MG/ML SOLN COMPARISON:  CT of the chest abdomen pelvis dated 04/19/2019. FINDINGS: CTA CHEST FINDINGS Cardiovascular: There is no cardiomegaly or pericardial effusion. There is 3 vessel coronary vascular calcification. Mild atherosclerotic calcification of the thoracic aorta. No aneurysmal dilatation or dissection. No pulmonary artery embolus identified. Right-sided Port-A-Cath with tip in the region of the cavoatrial junction. Mediastinum/Nodes: Mildly enlarged right hilar lymph node measures 11 mm similar to prior CT. No new adenopathy. The esophagus and the thyroid gland are grossly unremarkable. No mediastinal fluid collection. Lungs/Pleura: There is a 1 cm right upper lobe nodule along the major fissure (series 7, image 65), similar to prior CT. Additional right upper lobe subpleural nodule (series 7, image 34) measures approximately 13 mm (previously 11 mm). A 4 mm  nodule at the right lung base (series 7, image 118) previously measured 2 mm. A 5 mm nodule at the right lung base (series 7, image 125) previously measured 2 mm. There is an area of scarring in the right middle lobe inferiorly. There is a small right pleural effusion which has increased in size since the prior CT. There is no pneumothorax. The central airways are patent. Musculoskeletal: There has been increase in the size of the mixed lytic and sclerotic metastatic disease involving the posterior right  eleventh rib. There is degenerative changes of the spine. There is mixed lytic and sclerotic metastasis involving the sternum. Review of the MIP images confirms the above findings. CT ABDOMEN and PELVIS FINDINGS No intra-abdominal free air or free fluid. Hepatobiliary: Probable background of fatty infiltration of the liver. There has been interval increase in the size of the hypoenhancing lesion in the inferior right lobe of the liver (series 2, image 25) now measuring up to 2.1 cm in greatest dimension (previously approximately 7 mm). Several additional smaller hypodense lesions in segment VII appear new since the prior CT. The gallbladder is unremarkable. There is dilatation of the intrahepatic biliary tree, progressed since the prior CT. There is also dilatation of the common bile duct. The common bile duct measures approximately 10 mm in diameter, previously measured approximately 8 mm. Pancreas: There is a 3.5 x 2.4 cm hypoenhancing mass in the head/uncinate process of the pancreas (previously measuring approximately 3.8 x 2.1 cm). There is complete atrophy of the gland with dilatation of the main pancreatic duct as seen previously. Spleen: Normal in size without focal abnormality. Adrenals/Urinary Tract: There is calcification of the medial limb of the left adrenal gland, likely sequela of prior insult. The right adrenal gland is unremarkable. There is no hydronephrosis on either side. There is symmetric enhancement and excretion of contrast by both kidneys. The visualized ureters and urinary bladder appear unremarkable. Stomach/Bowel: Postsurgical changes of sigmoid resection with anastomosis. There is a left anterior abdominal loop colostomy. Anastomotic suture is also noted in the right lower quadrant. There is mildly dilated and fecalized loop of bowel in the right hemipelvis which may represent a degree of obstruction. The appendix is normal. Vascular/Lymphatic: Mild aortoiliac atherosclerotic disease. An  infrarenal IVC filter is noted. The SMV, splenic vein, and main portal vein are patent. No portal venous gas. Mildly enlarged portacaval lymph node measures approximately 11 mm in short axis (previously 7 mm). Additional mildly enlarged gastrohepatic lymph node measures approximately 11 mm in short axis. Reproductive: The prostate and seminal vesicles are grossly unremarkable. Other: None Musculoskeletal: There is degenerative changes of the spine. No acute osseous pathology. Review of the MIP images confirms the above findings. IMPRESSION: 1. No CT evidence of pulmonary artery embolus. 2. Mildly dilated and fecalized small bowel loop in the pelvis may represent a partial or early obstruction. Clinical correlation and follow-up recommended. 3. Overall progression of disease with progression of pulmonary and hepatic metastatic lesions. 4. Small right pleural effusion has increased in size since the prior CT. 5. No significant interval change in the size of the soft tissue mass in the head/uncinate process of the pancreas. 6. Worsened biliary ductal dilatation compared to the prior CT. 7. Interval increase in the size of the mixed lytic and sclerotic lesion involving the posterior right eleventh rib. 8. Aortic Atherosclerosis (ICD10-I70.0). Electronically Signed   By: Anner Crete M.D.   On: 07/16/2019 20:07   DG Chest Portable 1 View  Result Date:  07/16/2019 CLINICAL DATA:  Chest pain, rectal cancer EXAM: PORTABLE CHEST 1 VIEW COMPARISON:  11/25/2018 chest radiograph. FINDINGS: Right subclavian Port-A-Cath terminates in the lower third of the SVC. Stable cardiomediastinal silhouette with normal heart size. No pneumothorax. No pleural effusion. Pulmonary nodules scattered in the mid and upper right lung, not definitely changed. No pulmonary edema. No acute consolidative airspace disease. IMPRESSION: 1. No acute cardiopulmonary disease. 2. Right pulmonary nodules, not definitely changed by chest radiographs.  Electronically Signed   By: Ilona Sorrel M.D.   On: 07/16/2019 19:32   DG ERCP  Result Date: 07/18/2019 CLINICAL DATA:  History of metastatic disease. EXAM: ERCP TECHNIQUE: Multiple spot images obtained with the fluoroscopic device and submitted for interpretation post-procedure. FLUOROSCOPY TIME:  Fluoroscopy Time:  1 minutes 11 seconds COMPARISON:  CT 07/16/2019 FINDINGS: Visualization of the IVC filter. Cannulation of the biliary duct and retrograde cholangiogram was performed. Obstruction involving the distal common bile duct. Placement of a metallic stent in the distal common bile duct. Patent cystic duct. IMPRESSION: Distal common bile duct obstruction and placement of a metallic biliary stent. These images were submitted for radiologic interpretation only. Please see the procedural report for the amount of contrast and the fluoroscopy time utilized. Electronically Signed   By: Markus Daft M.D.   On: 07/18/2019 15:19   US Abdomen Limited RUQ  Result Date: 07/17/2019 CLINICAL DATA:  Abdominal pain EXAM: ULTRASOUND ABDOMEN LIMITED RIGHT UPPER QUADRANT COMPARISON:  Abdominal CT from yesterday FINDINGS: Gallbladder: Sludge and small gallstones. Wall thickness is 5 mm but there is no focal tenderness. Common bile duct: Diameter: 13 mm Liver: Intrahepatic bile duct dilatation. Liver lesions by CT are not clearly seen on this study, limited by bowel gas. Portal vein is patent on color Doppler imaging with normal direction of blood flow towards the liver. IMPRESSION: 1. Cholelithiasis and sludge. There is mild gallbladder wall thickening but no over distension or focal tenderness typical of acute cholecystitis. 2. Intra and extrahepatic bile duct dilatation in the setting of known pancreas mass. Electronically Signed   By: Monte Fantasia M.D.   On: 07/17/2019 10:36     Microbiology: Recent Results (from the past 240 hour(s))  SARS CORONAVIRUS 2 (Regis Wiland 6-24 HRS) Nasopharyngeal Nasopharyngeal Swab      Status: None   Collection Time: 07/16/19 11:01 PM   Specimen: Nasopharyngeal Swab  Result Value Ref Range Status   SARS Coronavirus 2 NEGATIVE NEGATIVE Final    Comment: (NOTE) SARS-CoV-2 target nucleic acids are NOT DETECTED. The SARS-CoV-2 RNA is generally detectable in upper and lower respiratory specimens during the acute phase of infection. Negative results do not preclude SARS-CoV-2 infection, do not rule out co-infections with other pathogens, and should not be used as the sole basis for treatment or other patient management decisions. Negative results must be combined with clinical observations, patient history, and epidemiological information. The expected result is Negative. Fact Sheet for Patients: SugarRoll.be Fact Sheet for Healthcare Providers: https://www.woods-mathews.com/ This test is not yet approved or cleared by the [place] FDA and  has been authorized for detection and/or diagnosis of SARS-CoV-2 by FDA under an Emergency Use Authorization (EUA). This EUA will remain  in effect (meaning this test can be used) for the duration of the COVID-19 declaration under Section 56 4(b)(1) of the Act, 21 U.S.C. section 360bbb-3(b)(1), unless the authorization is terminated or revoked sooner. Performed at [facility], [place] [address] [place], [place] [address]   Urine culture  Status: None   Collection Time: 07/16/19 11:18 PM   Specimen: Urine, Clean Catch  Result Value Ref Range Status   Specimen Description   Final    URINE, CLEAN CATCH Performed at [facility], [address], [place], [place] [address]    Special Requests   Final    NONE Performed at [facility], [address], [place], [place] [address]    Culture   Final    NO GROWTH Performed at [facility], [place] [address], [place], [place] [address]    Report Status 07/18/2019 FINAL  Final  MRSA PCR Screening     Status: None   Collection  Time: 07/18/19  2:27 AM   Specimen: Nasal Mucosa; Nasopharyngeal  Result Value Ref Range Status   MRSA by PCR NEGATIVE NEGATIVE Final    Comment:        The GeneXpert MRSA Assay (FDA approved for NASAL specimens only), is one component of a comprehensive MRSA colonization surveillance program. It is not intended to diagnose MRSA infection nor to guide or monitor treatment for MRSA infections. Performed at [facility], [address], [place], [place] [address]      Labs: Basic Metabolic Panel: Recent Labs  Lab 07/16/19 1859 07/16/19 1859 07/17/19 0544 07/18/19 0504  NA 133*  --  135 137  K 3.6   < > 3.2* 3.7  CL 101  --  103 105  CO2 22  --  24 25  GLUCOSE 183*  --  43* 94  BUN 10  --  8 6*  CREATININE 0.65  --  0.57* 0.58*  CALCIUM 8.3*  --  7.9* 8.1*   < > = values in this interval not displayed.   Liver Function Tests: Recent Labs  Lab 07/16/19 1859 07/17/19 0544 07/18/19 0504  AST 276* 227* 167*  ALT 293* 250* 199*  ALKPHOS 596* 513* 465*  BILITOT 4.8* 5.1* 4.3*  PROT 5.6* 5.1* 4.8*  ALBUMIN 2.9* 2.5* 2.3*   Recent Labs  Lab 07/16/19 1859  LIPASE 11   No results for input(s): AMMONIA in the last 168 hours. CBC: Recent Labs  Lab 07/16/19 1859 07/17/19 0544 07/18/19 0504  WBC 13.4* 14.3* 7.7  NEUTROABS 10.8*  --   --   HGB 11.2* 10.3* 9.7*  HCT 34.6* 33.4* 31.4*  MCV 86.9 90.0 90.0  PLT 308 267 246   Cardiac Enzymes: No results for input(s): CKTOTAL, CKMB, CKMBINDEX, TROPONINI in the last 168 hours. BNP: Invalid input(s): POCBNP CBG: Recent Labs  Lab 07/17/19 2125 07/18/19 0734 07/18/19 1120 07/18/19 1508 07/18/19 1702  GLUCAP 127* 97 98 90 125*    Time coordinating discharge:  36 minutes  Signed:  Orson Eva, DO [facility] Pager: [phone] 07/18/2019, 5:12 PM

## 2019-07-18 NOTE — Progress Notes (Signed)
  Subjective:  Patient feels better.  He rates his right sided abdominal and chest pain as 1.  He had 10 mg of oxycodone and 325 mg of acetaminophen around 5:50 AM this morning.  He denies shortness of breath.  He is passing soft brown stool into colostomy bag.  He denies shortness of breath.  He states he has had mild itching for about 3 months.  Objective: Blood pressure 106/71, pulse 77, temperature 98.5 F (36.9 C), temperature source Oral, resp. rate 16, height 6' 2" (1.88 m), weight 71.9 kg, SpO2 99 %. Patient is alert and in no acute distress Sclera remains mildly icteric. He has Port-A-Cath in right pectoral region. Cardiac exam with regular rhythm normal S1 and S2.  No murmur gallop noted. Auscultation lungs reveal vesicular breath sounds bilaterally. Abdomen is flat with colostomy bag in left midabdomen containing soft brown stool. Abdomen is soft and nontender. Trace edema around ankles.   Labs/studies Results:  CBC Latest Ref Rng & Units 07/18/2019 07/17/2019 07/16/2019  WBC 4.0 - 10.5 K/uL 7.7 14.3(H) 13.4(H)  Hemoglobin 13.0 - 17.0 g/dL 9.7(L) 10.3(L) 11.2(L)  Hematocrit 39.0 - 52.0 % 31.4(L) 33.4(L) 34.6(L)  Platelets 150 - 400 K/uL 246 267 308    CMP Latest Ref Rng & Units 07/18/2019 07/17/2019 07/16/2019  Glucose 70 - 99 mg/dL 94 43(LL) 183(H)  BUN 8 - 23 mg/dL 6(L) 8 10  Creatinine 0.61 - 1.24 mg/dL 0.58(L) 0.57(L) 0.65  Sodium 135 - 145 mmol/L 137 135 133(L)  Potassium 3.5 - 5.1 mmol/L 3.7 3.2(L) 3.6  Chloride 98 - 111 mmol/L 105 103 101  CO2 22 - 32 mmol/L _0 Calcium 8.9 - 10.3 mg/dL 8.1(L) 7.9(L) 8.3(L)  Total Protein 6.5 - 8.1 g/dL 4.8(L) 5.1(L) 5.6(L)  Total Bilirubin 0.3 - 1.2 mg/dL 4.3(H) 5.1(H) 4.8(H)  Alkaline Phos 38 - 126 U/L 465(H) 513(H) 596(H)  AST 15 - 41 U/L 167(H) 227(H) 276(H)  ALT 0 - 44 U/L 199(H) 250(H) 293(H)    Hepatic Function Latest Ref Rng & Units 07/18/2019 07/17/2019 07/16/2019  Total Protein 6.5 - 8.1 g/dL 4.8(L) 5.1(L) 5.6(L)   Albumin 3.5 - 5.0 g/dL 2.3(L) 2.5(L) 2.9(L)  AST 15 - 41 U/L 167(H) 227(H) 276(H)  ALT 0 - 44 U/L 199(H) 250(H) 293(H)  Alk Phosphatase 38 - 126 U/L 465(H) 513(H) 596(H)  Total Bilirubin 0.3 - 1.2 mg/dL 4.3(H) 5.1(H) 4.8(H)  Bilirubin, Direct 0.0 - 0.2 mg/dL - - -      Assessment:  #1.  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  He had fully covered Wallstent placed in May 2020 for obstructive jaundice.  He has passed stent spontaneously sometime before October 2020.  Surprisingly he did not develop cholestasis until recently.  Will need to be restented.  Patient should be able to go home later today.  #2.  Metastatic colon carcinoma involving pancreas liver and lungs.  Based on imaging study his disease appears to be progressive.  Last chemotherapy was put on hold because of cholestasis.  #3.  Anemia secondary to chronic disease.  Plan:  ERCP with biliary stenting later today.

## 2019-07-18 NOTE — Transfer of Care (Signed)
Immediate Anesthesia Transfer of Care Note  Patient: THESEUS BIRNIE  Procedure(s) Performed: ENDOSCOPIC RETROGRADE CHOLANGIOPANCREATOGRAPHY (ERCP) (N/A ) BILIARY STENT PLACEMENT (N/A )  Patient Location: PACU  Anesthesia Type:General  Level of Consciousness: awake  Airway & Oxygen Therapy: Patient Spontanous Breathing  Post-op Assessment: Report given to RN  Post vital signs: Reviewed  Last Vitals:  Vitals Value Taken Time  BP 142/77 07/18/19 1456  Temp    Pulse 43 07/18/19 1456  Resp    SpO2 83 % 07/18/19 1456  Vitals shown include unvalidated device data.  Last Pain:  Vitals:   07/18/19 1411  TempSrc:   PainSc: 0-No pain      Patients Stated Pain Goal: 4 (23/34/35 6861)  Complications: No apparent anesthesia complications

## 2019-07-18 NOTE — Anesthesia Procedure Notes (Signed)
Procedure Name: Intubation Date/Time: 07/18/2019 1:56 PM Performed by: Ollen Bowl, CRNA Pre-anesthesia Checklist: Patient identified, Patient being monitored, Timeout performed, Emergency Drugs available and Suction available Patient Re-evaluated:Patient Re-evaluated prior to induction Oxygen Delivery Method: Circle system utilized Preoxygenation: Pre-oxygenation with 100% oxygen Induction Type: IV induction Ventilation: Mask ventilation without difficulty Laryngoscope Size: Mac and 3 Grade View: Grade I Tube type: Oral Tube size: 7.5 mm Number of attempts: 1 Airway Equipment and Method: Stylet Placement Confirmation: ETT inserted through vocal cords under direct vision,  positive ETCO2 and breath sounds checked- equal and bilateral Secured at: 22 cm Tube secured with: Tape Dental Injury: Teeth and Oropharynx as per pre-operative assessment

## 2019-07-18 NOTE — Anesthesia Postprocedure Evaluation (Signed)
Anesthesia Post Note  Patient: Charles [name]  Procedure(s) Performed: ENDOSCOPIC RETROGRADE CHOLANGIOPANCREATOGRAPHY (ERCP) (N/A ) BILIARY STENT PLACEMENT (N/A )  Patient location during evaluation: PACU Anesthesia Type: General Level of consciousness: awake and alert and oriented Pain management: pain level controlled Vital Signs Assessment: post-procedure vital signs reviewed and stable Respiratory status: spontaneous breathing Cardiovascular status: blood pressure returned to baseline and stable Postop Assessment: no apparent nausea or vomiting Anesthetic complications: no     Last Vitals:  Vitals:   07/18/19 1330 07/18/19 1500  BP: 134/74 (!) 143/78  Pulse: 82 60  Resp: (!) 23 19  Temp: 36.4 C 36.7 C  SpO2:  98%    Last Pain:  Vitals:   07/18/19 1500  TempSrc:   PainSc: 0-No pain                 Eagle Pitta

## 2019-07-19 NOTE — Addendum Note (Signed)
Addendum  created 07/19/19 1008 by Vista Deck, CRNA   Intraprocedure Staff edited

## 2019-07-20 ENCOUNTER — Other Ambulatory Visit: Payer: Self-pay

## 2019-07-20 ENCOUNTER — Telehealth (INDEPENDENT_AMBULATORY_CARE_PROVIDER_SITE_OTHER): Payer: Self-pay | Admitting: *Deleted

## 2019-07-20 ENCOUNTER — Other Ambulatory Visit (HOSPITAL_COMMUNITY): Payer: Medicare Other

## 2019-07-20 ENCOUNTER — Observation Stay (HOSPITAL_COMMUNITY)
Admission: EM | Admit: 2019-07-20 | Discharge: 2019-07-21 | Disposition: A | Discharge status: Home / Self Care | Payer: Medicare Other | Attending: Internal Medicine | Admitting: Internal Medicine

## 2019-07-20 ENCOUNTER — Encounter (HOSPITAL_COMMUNITY): Payer: Medicare Other

## 2019-07-20 ENCOUNTER — Encounter (HOSPITAL_COMMUNITY): Payer: Self-pay | Admitting: *Deleted

## 2019-07-20 ENCOUNTER — Ambulatory Visit (HOSPITAL_COMMUNITY): Payer: Medicare Other | Admitting: Hematology

## 2019-07-20 ENCOUNTER — Emergency Department (HOSPITAL_COMMUNITY): Payer: Medicare Other

## 2019-07-20 ENCOUNTER — Ambulatory Visit (HOSPITAL_COMMUNITY): Payer: Medicare Other

## 2019-07-20 DIAGNOSIS — C7800 Secondary malignant neoplasm of unspecified lung: Secondary | ICD-10-CM | POA: Diagnosis not present

## 2019-07-20 DIAGNOSIS — Z885 Allergy status to narcotic agent status: Secondary | ICD-10-CM | POA: Insufficient documentation

## 2019-07-20 DIAGNOSIS — E119 Type 2 diabetes mellitus without complications: Secondary | ICD-10-CM | POA: Insufficient documentation

## 2019-07-20 DIAGNOSIS — Z7984 Long term (current) use of oral hypoglycemic drugs: Secondary | ICD-10-CM | POA: Diagnosis not present

## 2019-07-20 DIAGNOSIS — Z86718 Personal history of other venous thrombosis and embolism: Secondary | ICD-10-CM | POA: Diagnosis not present

## 2019-07-20 DIAGNOSIS — R791 Abnormal coagulation profile: Secondary | ICD-10-CM | POA: Insufficient documentation

## 2019-07-20 DIAGNOSIS — Z888 Allergy status to other drugs, medicaments and biological substances status: Secondary | ICD-10-CM | POA: Insufficient documentation

## 2019-07-20 DIAGNOSIS — Z86711 Personal history of pulmonary embolism: Secondary | ICD-10-CM | POA: Insufficient documentation

## 2019-07-20 DIAGNOSIS — K838 Other specified diseases of biliary tract: Secondary | ICD-10-CM | POA: Diagnosis present

## 2019-07-20 DIAGNOSIS — T85520A Displacement of bile duct prosthesis, initial encounter: Secondary | ICD-10-CM

## 2019-07-20 DIAGNOSIS — R509 Fever, unspecified: Secondary | ICD-10-CM | POA: Diagnosis not present

## 2019-07-20 DIAGNOSIS — I82409 Acute embolism and thrombosis of unspecified deep veins of unspecified lower extremity: Secondary | ICD-10-CM | POA: Diagnosis present

## 2019-07-20 DIAGNOSIS — K8309 Other cholangitis: Secondary | ICD-10-CM | POA: Diagnosis not present

## 2019-07-20 DIAGNOSIS — Z79899 Other long term (current) drug therapy: Secondary | ICD-10-CM | POA: Insufficient documentation

## 2019-07-20 DIAGNOSIS — Z20822 Contact with and (suspected) exposure to covid-19: Secondary | ICD-10-CM | POA: Insufficient documentation

## 2019-07-20 DIAGNOSIS — C20 Malignant neoplasm of rectum: Secondary | ICD-10-CM | POA: Diagnosis not present

## 2019-07-20 DIAGNOSIS — A419 Sepsis, unspecified organism: Secondary | ICD-10-CM | POA: Diagnosis present

## 2019-07-20 DIAGNOSIS — J189 Pneumonia, unspecified organism: Secondary | ICD-10-CM | POA: Diagnosis not present

## 2019-07-20 DIAGNOSIS — A4189 Other specified sepsis: Principal | ICD-10-CM | POA: Insufficient documentation

## 2019-07-20 DIAGNOSIS — I10 Essential (primary) hypertension: Secondary | ICD-10-CM | POA: Insufficient documentation

## 2019-07-20 LAB — PROTIME-INR
INR: 1.3 — ABNORMAL HIGH (ref 0.8–1.2)
Prothrombin Time: 16.4 seconds — ABNORMAL HIGH (ref 11.4–15.2)

## 2019-07-20 LAB — CBC WITH DIFFERENTIAL/PLATELET
Abs Immature Granulocytes: 0.07 10*3/uL (ref 0.00–0.07)
Basophils Absolute: 0 10*3/uL (ref 0.0–0.1)
Basophils Relative: 0 %
Eosinophils Absolute: 0.1 10*3/uL (ref 0.0–0.5)
Eosinophils Relative: 1 %
HCT: 35.5 % — ABNORMAL LOW (ref 39.0–52.0)
Hemoglobin: 11.1 g/dL — ABNORMAL LOW (ref 13.0–17.0)
Immature Granulocytes: 1 %
Lymphocytes Relative: 8 %
Lymphs Abs: 1.1 10*3/uL (ref 0.7–4.0)
MCH: 28.3 pg (ref 26.0–34.0)
MCHC: 31.3 g/dL (ref 30.0–36.0)
MCV: 90.6 fL (ref 80.0–100.0)
Monocytes Absolute: 1.2 10*3/uL — ABNORMAL HIGH (ref 0.1–1.0)
Monocytes Relative: 8 %
Neutro Abs: 11.7 10*3/uL — ABNORMAL HIGH (ref 1.7–7.7)
Neutrophils Relative %: 82 %
Platelets: 297 10*3/uL (ref 150–400)
RBC: 3.92 MIL/uL — ABNORMAL LOW (ref 4.22–5.81)
RDW: 17.9 % — ABNORMAL HIGH (ref 11.5–15.5)
WBC: 14.3 10*3/uL — ABNORMAL HIGH (ref 4.0–10.5)
nRBC: 0 % (ref 0.0–0.2)

## 2019-07-20 LAB — LIPASE, BLOOD: Lipase: 12 U/L (ref 11–51)

## 2019-07-20 LAB — COMPREHENSIVE METABOLIC PANEL
ALT: 133 U/L — ABNORMAL HIGH (ref 0–44)
AST: 94 U/L — ABNORMAL HIGH (ref 15–41)
Albumin: 2.6 g/dL — ABNORMAL LOW (ref 3.5–5.0)
Alkaline Phosphatase: 488 U/L — ABNORMAL HIGH (ref 38–126)
Anion gap: 10 (ref 5–15)
BUN: 10 mg/dL (ref 8–23)
CO2: 24 mmol/L (ref 22–32)
Calcium: 7.9 mg/dL — ABNORMAL LOW (ref 8.9–10.3)
Chloride: 99 mmol/L (ref 98–111)
Creatinine, Ser: 0.67 mg/dL (ref 0.61–1.24)
GFR calc Af Amer: >60 mL/min (ref >60)
GFR calc non Af Amer: >60 mL/min (ref >60)
Glucose, Bld: 111 mg/dL — ABNORMAL HIGH (ref 70–99)
Potassium: 3.4 mmol/L — ABNORMAL LOW (ref 3.5–5.1)
Sodium: 133 mmol/L — ABNORMAL LOW (ref 135–145)
Total Bilirubin: 3.8 mg/dL — ABNORMAL HIGH (ref 0.3–1.2)
Total Protein: 5.8 g/dL — ABNORMAL LOW (ref 6.5–8.1)

## 2019-07-20 LAB — LACTIC ACID, PLASMA
Lactic Acid, Venous: 2 mmol/L (ref 0.5–1.9)
Lactic Acid, Venous: 1.7 mmol/L (ref 0.5–1.9)

## 2019-07-20 LAB — APTT: aPTT: 46 seconds — ABNORMAL HIGH (ref 24–36)

## 2019-07-20 MED ORDER — OXYCODONE-ACETAMINOPHEN 5-325 MG PO TABS: 1.0000 | ORAL_TABLET | Freq: Three times a day (TID) | ORAL | Status: DC | PRN

## 2019-07-20 MED ORDER — ACETAMINOPHEN 650 MG RE SUPP: 650.0000 mg | Freq: Four times a day (QID) | RECTAL | Status: DC | PRN

## 2019-07-20 MED ORDER — HYDROMORPHONE HCL 1 MG/ML IJ SOLN
1.0000 mg | Freq: Once | INTRAMUSCULAR | Status: AC
  Administered 2019-07-20: 1 mg via INTRAVENOUS
  Filled 2019-07-20: qty 1

## 2019-07-20 MED ORDER — GABAPENTIN 300 MG PO CAPS
900.0000 mg | ORAL_CAPSULE | Freq: Two times a day (BID) | ORAL | Status: DC
  Administered 2019-07-21 (×2): 900 mg via ORAL
  Filled 2019-07-20 (×2): qty 3

## 2019-07-20 MED ORDER — SODIUM CHLORIDE 0.9 % IV SOLN
2.0000 g | Freq: Three times a day (TID) | INTRAVENOUS | Status: DC
  Administered 2019-07-21 (×2): 2 g via INTRAVENOUS
  Filled 2019-07-20 (×2): qty 2

## 2019-07-20 MED ORDER — HYDROMORPHONE HCL 1 MG/ML IJ SOLN
0.5000 mg | INTRAMUSCULAR | Status: DC | PRN
  Administered 2019-07-21: 1 mg via INTRAVENOUS
  Filled 2019-07-20: qty 1

## 2019-07-20 MED ORDER — ACETAMINOPHEN 325 MG PO TABS
650.0000 mg | ORAL_TABLET | Freq: Once | ORAL | Status: AC
  Administered 2019-07-20: 650 mg via ORAL
  Filled 2019-07-20: qty 2

## 2019-07-20 MED ORDER — SODIUM CHLORIDE 0.9 % IV BOLUS
1000.0000 mL | Freq: Once | INTRAVENOUS | Status: AC
  Administered 2019-07-20: 1000 mL via INTRAVENOUS

## 2019-07-20 MED ORDER — METRONIDAZOLE IN NACL 5-0.79 MG/ML-% IV SOLN
500.0000 mg | Freq: Three times a day (TID) | INTRAVENOUS | Status: DC
  Administered 2019-07-21: 500 mg via INTRAVENOUS
  Filled 2019-07-20: qty 100

## 2019-07-20 MED ORDER — OXYCODONE-ACETAMINOPHEN 10-325 MG PO TABS: 1.0000 | ORAL_TABLET | Freq: Three times a day (TID) | ORAL | Status: DC | PRN

## 2019-07-20 MED ORDER — METRONIDAZOLE IN NACL 5-0.79 MG/ML-% IV SOLN
500.0000 mg | Freq: Once | INTRAVENOUS | Status: AC
  Administered 2019-07-20: 500 mg via INTRAVENOUS
  Filled 2019-07-20: qty 100

## 2019-07-20 MED ORDER — ONDANSETRON HCL 4 MG/2ML IJ SOLN: 4.0000 mg | Freq: Four times a day (QID) | INTRAMUSCULAR | Status: DC | PRN

## 2019-07-20 MED ORDER — INSULIN ASPART 100 UNIT/ML ~~LOC~~ SOLN
0.0000 [IU] | Freq: Three times a day (TID) | SUBCUTANEOUS | Status: DC
  Administered 2019-07-21: 2 [IU] via SUBCUTANEOUS

## 2019-07-20 MED ORDER — MAGNESIUM OXIDE 400 (241.3 MG) MG PO TABS
400.0000 mg | ORAL_TABLET | Freq: Two times a day (BID) | ORAL | Status: DC
  Administered 2019-07-21 (×2): 400 mg via ORAL
  Filled 2019-07-20 (×2): qty 1

## 2019-07-20 MED ORDER — ACETAMINOPHEN 325 MG PO TABS: 650.0000 mg | ORAL_TABLET | Freq: Four times a day (QID) | ORAL | Status: DC | PRN

## 2019-07-20 MED ORDER — SODIUM CHLORIDE 0.9 % IV SOLN
2.0000 g | Freq: Once | INTRAVENOUS | Status: AC
  Administered 2019-07-20: 2 g via INTRAVENOUS
  Filled 2019-07-20: qty 2

## 2019-07-20 MED ORDER — ONDANSETRON HCL 4 MG PO TABS: 4.0000 mg | ORAL_TABLET | Freq: Four times a day (QID) | ORAL | Status: DC | PRN

## 2019-07-20 MED ORDER — CLONAZEPAM 0.5 MG PO TABS: 1.0000 mg | ORAL_TABLET | Freq: Three times a day (TID) | ORAL | Status: DC | PRN

## 2019-07-20 MED ORDER — RIVAROXABAN 20 MG PO TABS
20.0000 mg | ORAL_TABLET | Freq: Every day | ORAL | Status: DC
  Filled 2019-07-20: qty 1

## 2019-07-20 MED ORDER — INSULIN ASPART 100 UNIT/ML ~~LOC~~ SOLN: 0.0000 [IU] | Freq: Every day | SUBCUTANEOUS | Status: DC

## 2019-07-20 MED ORDER — OXYCODONE HCL 5 MG PO TABS: 5.0000 mg | ORAL_TABLET | Freq: Three times a day (TID) | ORAL | Status: DC | PRN

## 2019-07-20 MED ORDER — PANTOPRAZOLE SODIUM 40 MG PO TBEC: 40.0000 mg | DELAYED_RELEASE_TABLET | Freq: Every evening | ORAL | Status: DC

## 2019-07-20 MED ORDER — MIRTAZAPINE 15 MG PO TABS
7.5000 mg | ORAL_TABLET | Freq: Every day | ORAL | Status: DC
  Administered 2019-07-21: 7.5 mg via ORAL
  Filled 2019-07-20: qty 1

## 2019-07-20 MED ORDER — SODIUM CHLORIDE 0.9 % IV SOLN: INTRAVENOUS | Status: DC

## 2019-07-20 NOTE — Progress Notes (Signed)
Pharmacy Antibiotic Note  Charles [name] is a 69 y.o. male admitted on (Not on file) with intra-abdominal infection.  Pharmacy has been consulted for Cefepime dosing.  Plan: Cefepime 2000 mg IV every 8 hours. Flagyl x 1 dose. F/U additional doses. Monitor labs, c/s, and patient improvement     No data recorded.  Recent Labs  Lab 07/16/19 1859 07/17/19 0544 07/18/19 0504  WBC 13.4* 14.3* 7.7  CREATININE 0.65 0.57* 0.58*    Estimated Creatinine Clearance: 89.9 mL/min (A) (by C-G formula based on SCr of 0.58 mg/dL (L)).    Allergies  Allergen Reactions  . Oxycodone     Blisters, hallucinations  . Tramadol     Blisters, hallucinations  . Trazodone And Nefazodone Other (See Comments)    hallucinations    Antimicrobials this admission: Cefepime 3/17 >>  Flagyl 3/17 >>   Dose adjustments this admission: N/A  Microbiology results: 3/17 BCx: pending 3/17 UCx: pending    Thank you for allowing pharmacy to be a part of this patient's care.  Ramond Craver 07/20/2019 5:36 PM

## 2019-07-20 NOTE — ED Triage Notes (Signed)
Pt states he had a procedure done on Monday by Dr. Laural Golden, a stent in his liver and since the procedure he has been running a fever of 102;

## 2019-07-20 NOTE — ED Notes (Signed)
CRITICAL VALUE ALERT  Critical Value:  2.0 Lactic acid   Date & Time Notied:  07/20/19 & 650pm  Provider Notified: EDP   Orders Received/Actions taken: Notified

## 2019-07-20 NOTE — H&P (Signed)
History and Physical    Charles [name] TMY:[id number] DOB: Aug 23, 1950 DOA: 07/20/2019  PCP: [name], MD  Patient coming from: Home  I have personally briefly reviewed patient's old medical records in [place]  Chief Complaint: Fever  HPI: Charles [name] is a 69 y.o. male with medical history significant of metastatic rectal CA with mets to liver, HTN, ileostomy, lung mets, DM2, DVT on Xarelto.  Pt admitted recently and just discharged on 3/15 for abd pain, elevated LFTs.  Had ERCP with biliary stent placement on 3/15.  For full recounting see DC summary by Dr. Carles Collet.  Was discharged on Cipro.  Despite this yesterday pt developed fever, this persisted today and got as high as 102.5 at home.  Associated myalgias, generalized muscle weakness.  No SOB, no vomiting, no diarrhea.  GI sent him in for IV abx and admission.   ED Course: Tm 100, WBC 14.3k.  ALK 488 (stable), AST 94 and ALT 133 (significantly improved since this past week actually).  Started on cefepime and flagyl.   Review of Systems: As per HPI, otherwise all review of systems negative.  Past Medical History:  Diagnosis Date  . Chronic anticoagulation   . Colon cancer ([place]) 07/24/2010   rectal ca, inv adenocarcinoma  . Depression 04/01/2011  . Diabetes mellitus without complication ([place])   . DVT (deep venous thrombosis) ([place]) 05/09/2011  . GERD (gastroesophageal reflux disease)   . High output ileostomy ([place]) 05/21/2011  . History of kidney stones   . HTN (hypertension)   . Hx of radiation therapy 09/02/10 to 10/14/10   pelvis  . Lung metastasis ([place])   . Neuropathy   . Peripheral vascular disease ([place])    dvt's,pe  . Pneumonia    hx x3  . Pulmonary embolism ([place])   . Rectal cancer ([place]) 01/15/2011   S/P radiation and concurrent 5-FU continuous infusion from 09/09/10- 10/10/10.  S/P proctectomy with colorectal anastomosis and diverting loop ileostomy on 11/14/10 at [facility] by Dr. Harlon Ditty.  Pathology reveals a pT3b N1 with 3/20 lymph nodes.      Past Surgical History:  Procedure Laterality Date  . BILIARY STENT PLACEMENT N/A 09/16/2018   Procedure: STENT PLACEMENT;  Surgeon: Rogene Houston, MD;  [facility];  Service: Endoscopy;  Laterality: N/A;  . BILIARY STENT PLACEMENT N/A 07/18/2019   Procedure: BILIARY STENT PLACEMENT;  Surgeon: Rogene Houston, MD;  [facility];  Service: Endoscopy;  Laterality: N/A;  . BIOPSY  09/24/2018   Procedure: BIOPSY;  Surgeon: Danie Binder, MD;  [facility];  Service: Endoscopy;;  . COLON SURGERY  11/14/2010   proctectomy with colorectal anastomosis and diverting loop ileostomy (temporary planned)  . COLONOSCOPY  07/2010   proximal rectal apple core mass 10-14cm from anal verge (adenocarcinoma), 2-3cm distal rectal carpet polyp s/p piecemeal snare polypectomy (adenoma)  . COLONOSCOPY  04/21/2012   RMR: Friable,fibrotic appearing colorectal anastomosis producing some luminal narrowing-not felt to be critical. path: focal erosion with slight inflammation and hyperemia. SURVEILLANCE DUE DEC 2015  . COLONOSCOPY N/A 11/24/2013   Dr. Rourk:somewhat fibrotic/friable anastomotic mucosal-status post biopsy (narrowing not felt to be clinically significant) Single colonic diverticulum. benign polypoid rectal mucosa  . colostomy reversal  april 2013  . ERCP N/A 09/16/2018   Procedure: ENDOSCOPIC RETROGRADE CHOLANGIOPANCREATOGRAPHY (ERCP);  Surgeon: Rogene Houston, MD;  [facility];  Service: Endoscopy;  Laterality: N/A;  . ERCP N/A 07/18/2019  Procedure: ENDOSCOPIC RETROGRADE CHOLANGIOPANCREATOGRAPHY (ERCP);  Surgeon: Rogene Houston, MD;  [facility];  Service: Endoscopy;  Laterality: N/A;  . ESOPHAGOGASTRODUODENOSCOPY  07/2010   RMR: schatki ring s/p dilation, small hh, SB bx benign  . ESOPHAGOGASTRODUODENOSCOPY N/A 09/04/2015   Procedure: ESOPHAGOGASTRODUODENOSCOPY (EGD);  Surgeon: Daneil Dolin, MD;  [facility];  Service: Endoscopy;  Laterality: N/A;  730  . EUS  08/2010   Dr. Owens Loffler. uT3N0 circumferential, nearly obstruction rectosigmoid adenocarcinoma, distal edge 12cm from anal verge  . EUS N/A 06/19/2016   Procedure: UPPER ENDOSCOPIC ULTRASOUND (EUS) RADIAL;  Surgeon: Milus Banister, MD;  [facility];  Service: Endoscopy;  Laterality: N/A;  . FLEXIBLE SIGMOIDOSCOPY N/A 09/24/2018   Procedure: FLEXIBLE SIGMOIDOSCOPY;  Surgeon: Danie Binder, MD;  [facility];  Service: Endoscopy;  Laterality: N/A;  . HERNIA REPAIR     abd hernia repair  . IVC filter    . ivc filter    . port a cath placement    . PORT-A-CATH REMOVAL  09/24/2011   Procedure: REMOVAL PORT-A-CATH;  Surgeon: Donato Heinz, MD;  Location: [facility];  Service: General;  Laterality: N/A;  Minor Room  . PORTACATH PLACEMENT Right 07/07/2016   Procedure: INSERTION PORT-A-CATH;  Surgeon: Aviva Signs, MD;  [facility];  Service: General;  Laterality: Right;  . SPHINCTEROTOMY N/A 09/16/2018   Procedure: SPHINCTEROTOMY with balloon dialation;  Surgeon: Rogene Houston, MD;  [facility];  Service: Endoscopy;  Laterality: N/A;  . TRANSVERSE LOOP COLOSTOMY N/A 09/29/2018   Procedure: TRANSVERSE LOOP COLOSTOMY;  Surgeon: Aviva Signs, MD;  [facility];  Service: General;  Laterality: N/A;  . VIDEO ASSISTED THORACOSCOPY (VATS)/WEDGE RESECTION Right 11/15/2014   Procedure: VIDEO ASSISTED THORACOSCOPY (VATS)/LUNG RESECTION WITH RIGHT LINGULECTOMY;  Surgeon: [name], MD;  Location: [place];  Service: Thoracic;  Laterality: Right;  Marland Kitchen VIDEO BRONCHOSCOPY N/A 11/15/2014   Procedure: VIDEO BRONCHOSCOPY;  Surgeon: [name], MD;  Location: [place];  Service: Thoracic;  Laterality: N/A;     reports that he has never smoked. He has never used smokeless tobacco. He reports that he does not drink alcohol or use drugs.  Allergies  Allergen Reactions  .  Oxycodone     Blisters, hallucinations (takes Percocet at home)  . Tramadol     Blisters, hallucinations  . Trazodone And Nefazodone Other (See Comments)    hallucinations    Family History  Problem Relation Age of Onset  . Cancer Brother        throat  . Cancer Brother        prostate  . Colon cancer Neg Hx   . Liver disease Neg Hx   . Inflammatory bowel disease Neg Hx      Prior to Admission medications   Medication Sig Start Date End Date Taking? Authorizing Provider  acetaminophen (TYLENOL) 500 MG tablet Take 1,000 mg by mouth every 6 (six) hours as needed for mild pain or moderate pain.    Yes [provider]  ciprofloxacin (CIPRO) 500 MG tablet Take 1 tablet (500 mg total) by mouth 2 (two) times daily. 07/18/19  Yes Tat, Shanon Brow, MD  clonazePAM (KLONOPIN) 1 MG tablet TAKE 1 TABLET BY MOUTH THREE TIMES DAILY AS NEEDED Patient taking differently: Take 1 mg by mouth 3 (three) times daily as needed for anxiety.  06/28/19  Yes Lockamy, Randi L, NP-C  gabapentin (NEURONTIN) 300 MG capsule Take 3  capsules (900 mg total) by mouth 2 (two) times daily. 06/10/19  Yes Lockamy, Randi L, NP-C  glimepiride (AMARYL) 2 MG tablet Take 2 mg by mouth 2 (two) times daily before a meal.    Yes [provider]  magnesium oxide (MAG-OX) 400 (241.3 Mg) MG tablet Take 1 tablet (400 mg total) by mouth 2 (two) times daily. 05/11/19  Yes Derek Jack, MD  mirtazapine (REMERON) 15 MG tablet Take 7.5 mg by mouth at bedtime. 05/20/19  Yes [provider]  oxyCODONE-acetaminophen (PERCOCET) 10-325 MG tablet Take 1 tablet by mouth every 8 (eight) hours as needed for pain. 07/11/19  Yes Derek Jack, MD  pantoprazole (PROTONIX) 40 MG tablet Take 40 mg by mouth every evening.  12/13/18  Yes [provider]  Probiotic Product (PROBIOTIC DAILY PO) Take 1 capsule by mouth 2 (two) times daily.    Yes [provider]  rivaroxaban (XARELTO) 20 MG TABS tablet Take 1  tablet (20 mg total) by mouth daily with supper. 09/19/18  Yes Rehman, Mechele Dawley, MD  vitamin B-12 (CYANOCOBALAMIN) 1000 MCG tablet Take 1,000 mcg by mouth daily.   Yes [provider]    Physical Exam: Vitals:   07/20/19 2130 07/20/19 2200 07/20/19 2230 07/20/19 2300  BP: (!) 115/54 (!) 123/57 (!) 110/59 119/66  Pulse: 88 91 94 88  Resp: (!) 26 (!) '21 20 20  ' Temp:      TempSrc:      SpO2: 96% 96% 96% 96%  Weight:      Height:        Constitutional: NAD, calm, comfortable Eyes: PERRL, lids and conjunctivae normal ENMT: Mucous membranes are moist. Posterior pharynx clear of any exudate or lesions.Normal dentition.  Neck: normal, supple, no masses, no thyromegaly Respiratory: clear to auscultation bilaterally, no wheezing, no crackles. Normal respiratory effort. No accessory muscle use.  Cardiovascular: Regular rate and rhythm, no murmurs / rubs / gallops. No extremity edema. 2+ pedal pulses. No carotid bruits.  Abdomen: no tenderness, no masses palpated. No hepatosplenomegaly. Bowel sounds positive.  Musculoskeletal: no clubbing / cyanosis. No joint deformity upper and lower extremities. Good ROM, no contractures. Normal muscle tone.  Skin: no rashes, lesions, ulcers. No induration Neurologic: CN 2-12 grossly intact. Sensation intact, DTR normal. Strength 5/5 in all 4.  Psychiatric: Normal judgment and insight. Alert and oriented x 3. Normal mood.    Labs on Admission: I have personally reviewed following labs and imaging studies  CBC: Recent Labs  Lab 07/16/19 1859 07/17/19 0544 07/18/19 0504 07/20/19 1747  WBC 13.4* 14.3* 7.7 14.3*  NEUTROABS 10.8*  --   --  11.7*  HGB 11.2* 10.3* 9.7* 11.1*  HCT 34.6* 33.4* 31.4* 35.5*  MCV 86.9 90.0 90.0 90.6  PLT 308 267 246 326   Basic Metabolic Panel: Recent Labs  Lab 07/16/19 1859 07/17/19 0544 07/18/19 0504 07/20/19 1747  NA 133* 135 137 133*  K 3.6 3.2* 3.7 3.4*  CL 101 103 105 99  CO2 '22 24 25 24  ' GLUCOSE 183*  43* 94 111*  BUN 10 8 6* 10  CREATININE 0.65 0.57* 0.58* 0.67  CALCIUM 8.3* 7.9* 8.1* 7.9*   GFR: Estimated Creatinine Clearance: 93.5 mL/min (by C-G formula based on SCr of 0.67 mg/dL). Liver Function Tests: Recent Labs  Lab 07/16/19 1859 07/17/19 0544 07/18/19 0504 07/20/19 1747  AST 276* 227* 167* 94*  ALT 293* 250* 199* 133*  ALKPHOS 596* 513* 465* 488*  BILITOT 4.8* 5.1* 4.3* 3.8*  PROT 5.6* 5.1* 4.8* 5.8*  ALBUMIN 2.9* 2.5* 2.3* 2.6*   Recent Labs  Lab 07/16/19 1859 07/20/19 1747  LIPASE 11 12   No results for input(s): AMMONIA in the last 168 hours. Coagulation Profile: Recent Labs  Lab 07/20/19 1747  INR 1.3*   Cardiac Enzymes: No results for input(s): CKTOTAL, CKMB, CKMBINDEX, TROPONINI in the last 168 hours. BNP (last 3 results) No results for input(s): PROBNP in the last 8760 hours. HbA1C: No results for input(s): HGBA1C in the last 72 hours. CBG: Recent Labs  Lab 07/17/19 2125 07/18/19 0734 07/18/19 1120 07/18/19 1508 07/18/19 1702  GLUCAP 127* 97 98 90 125*   Lipid Profile: No results for input(s): CHOL, HDL, LDLCALC, TRIG, CHOLHDL, LDLDIRECT in the last 72 hours. Thyroid Function Tests: No results for input(s): TSH, T4TOTAL, FREET4, T3FREE, THYROIDAB in the last 72 hours. Anemia Panel: No results for input(s): VITAMINB12, FOLATE, FERRITIN, TIBC, IRON, RETICCTPCT in the last 72 hours. Urine analysis:    Component Value Date/Time   COLORURINE AMBER (A) 07/16/2019 2318   APPEARANCEUR CLEAR 07/16/2019 2318   LABSPEC >1.046 (H) 07/16/2019 2318   PHURINE 8.0 07/16/2019 2318   [place] 07/16/2019 2318   [place] NEGATIVE 07/16/2019 2318   [place] NEGATIVE 07/16/2019 2318   [place] NEGATIVE 07/16/2019 2318   PROTEINUR NEGATIVE 07/16/2019 2318   UROBILINOGEN 1.0 11/14/2014 1504   NITRITE NEGATIVE 07/16/2019 2318   LEUKOCYTESUR NEGATIVE 07/16/2019 2318    Radiological Exams on Admission: DG Chest Port 1 View  Result Date:  07/20/2019 CLINICAL DATA:  Fever EXAM: PORTABLE CHEST 1 VIEW COMPARISON:  07/16/2019 FINDINGS: Increased small right pleural effusion and right basilar atelectasis. Right lung nodular opacities again identified. No pneumothorax. Stable cardiomediastinal contours. Right chest wall port catheter tip is unchanged. IMPRESSION: Increased small right pleural effusion and right basilar atelectasis. Electronically Signed   By: Macy Mis M.D.   On: 07/20/2019 20:58    EKG: Independently reviewed.  Assessment/Plan Principal Problem:   Sepsis ([place]) Active Problems:   Rectal cancer (H[facility])   DVT (deep venous thrombosis) (H[facility])   Type 2 diabetes mellitus ([facility])   Dilation of biliary tract    1. Sepsis - due to suspected intra-abdominal (biliary) source 1. Empiric cefepime and flagyl 2. BCx pending 3. Dr. Laural Golden will see in AM presumably (he sent patient in for admit and ABx) 4. UA pending 5. Repeat CBC/CMP in AM 2. Stage 4 Rectal cancer - 1. Cont home oxy for pain control 2. Dilaudid PRN for breakthrough 3. Cont gabapentin 3. DM2 - 1. Sensitive SSI AC/HS 2. Hold home amaryl 4. H/o DVT - 1. Cont xarelto  DVT prophylaxis: Xarelto Code Status: Full Family Communication: No family in room Disposition Plan: Home after improved from sepsis standpoint Consults called: Dr. Laural Golden  Admission status: Place in obs    Odaliz Mcqueary, [place] Hospitalists  How to contact the [facility] Attending or Consulting provider [place] or covering provider during after hours [place], for this patient?  1. Check the care team in [facility] and look for a) attending/consulting [facility] provider listed and b) the [facility] team listed 2. Log into [email or web address]  Amion Physician Scheduling and messaging for groups and whole hospitals  On call and physician scheduling software for group practices, residents, hospitalists and other medical providers for call, clinic, rotation and shift schedules. On[employer] is a hospital-wide  system for scheduling doctors and paging doctors on call. EasyPlot is for scientific plotting and data analysis.  [email or web address]  and use [place]'s universal password to access. If you do not have the password, please contact the hospital operator.  3. Locate the [facility] provider you are looking for under [facility] and page to a number that you can be directly reached. 4. If you still have difficulty reaching the provider, please page the [facility] (Director on Call) for the Hospitalists listed on amion for assistance.  07/20/2019, 11:36 PM

## 2019-07-20 NOTE — ED Provider Notes (Signed)
[facility] EMERGENCY DEPARTMENT Provider Note   CSN: [id number] Arrival date & time: 07/20/19  1725     History Chief Complaint  Patient presents with  . Fever    Charles [name] is a 69 y.o. male.  HPI    This patient is a 69 year old male presenting at the request of his gastroenterologist who sent him to the hospital secondary to a fever 2 days after having a stent placed in his biliary tract.  He has a known history of rectal cancer, he has metastasis to the pancreas and the lung, he has had blood clots in the past and has an IVC filter.  He is also a type II diabetic and has peripheral vascular disease.  Because of some intermittent abdominal pain he had presented to the hospital several days ago.  There was worsening biliary ductal dilatation, GI was consulted and performed an ERCP, he had a biliary sphincterotomy with a metal stent placement back in May, there was a stricture in the common bile duct and the common hepatic duct for which she had a metal stent placed 2 days ago.  He was discharged that day.  Unfortunately yesterday the patient despite being discharged on Cipro and Xarelto developed a fever yesterday, today it was as high as 102.5.  The patient is feeling generally poorly with diffuse myalgias and comes back to the hospital at the request of his gastroenterologist.  Symptoms are persistent, gradually worsening, not associated with vomiting, no diarrhea, he did have some breakfast this morning.  Past Medical History:  Diagnosis Date  . Chronic anticoagulation   . Colon cancer ([place]) 07/24/2010   rectal ca, inv adenocarcinoma  . Depression 04/01/2011  . Diabetes mellitus without complication ([place])   . DVT (deep venous thrombosis) ([place]) 05/09/2011  . GERD (gastroesophageal reflux disease)   . High output ileostomy ([place]) 05/21/2011  . History of kidney stones   . HTN (hypertension)   . Hx of radiation therapy 09/02/10 to 10/14/10   pelvis  . Lung metastasis ([place])     . Neuropathy   . Peripheral vascular disease ([place])    dvt's,pe  . Pneumonia    hx x3  . Pulmonary embolism ([place])   . Rectal cancer ([place]) 01/15/2011   S/P radiation and concurrent 5-FU continuous infusion from 09/09/10- 10/10/10.  S/P proctectomy with colorectal anastomosis and diverting loop ileostomy on 11/14/10 at [facility] by Dr. Harlon Ditty. Pathology reveals a pT3b N1 with 3/20 lymph nodes.      Patient Active Problem List   Diagnosis Date Noted  . Hyperbilirubinemia 07/17/2019  . Dilation of biliary tract 07/17/2019  . Elevated LFTs   . Abdominal pain 07/16/2019  . Elevated bilirubin 07/16/2019  . Nausea, vomiting and diarrhea   . Metastatic colorectal cancer ([place])   . Anastomotic stricture of colorectal region   . Abnormal CT scan, colon   . Diarrhea in adult patient 09/22/2018  . History of pulmonary embolism 09/22/2018  . Hypokalemia 09/22/2018  . Type 2 diabetes mellitus ([place]) 09/22/2018  . Hypomagnesemia 09/22/2018  . Dilated gallbladder 09/22/2018  . Goals of care, counseling/discussion 09/20/2018  . Fecal incontinence 09/23/2017  . Rectal pain 05/30/2016  . Hiatal hernia   . Schatzki's ring   . GI bleed 11/23/2014  . Gastritis 11/23/2014  . S/P thoracotomy 11/23/2014  . S/P partial lobectomy of lung 11/15/2014  . DVT (deep venous thrombosis) ([place])   . Lung nodule   . Lung nodule, solitary   .  Lower abdominal pain 09/08/2014  . Urinary retention 09/08/2014  . Diarrhea 08/01/2014  . 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 Stiffness of joint, not elsewhere classified, ankle and foot 01/31/2014  . Weakness of both legs 01/31/2014  . Hx of radiation therapy   . Dehydration 03/10/2011  . Rectal cancer ([place]) 01/15/2011  . Colon cancer ([place]) 07/24/2010  . GERD 07/11/2010    Past Surgical History:  Procedure Laterality Date  . BILIARY STENT PLACEMENT N/A 09/16/2018   Procedure: STENT PLACEMENT;  Surgeon: Rogene Houston, MD;   [facility];  Service: Endoscopy;  Laterality: N/A;  . BILIARY STENT PLACEMENT N/A 07/18/2019   Procedure: BILIARY STENT PLACEMENT;  Surgeon: Rogene Houston, MD;  Location[facility];  Service: Endoscopy;  Laterality: N/A;  . BIOPSY  09/24/2018   Procedure: BIOPSY;  Surgeon: Danie Binder, MD;  Location[facility];  Service: Endoscopy;;  . COLON SURGERY  11/14/2010   proctectomy with colorectal anastomosis and diverting loop ileostomy (temporary planned)  . COLONOSCOPY  07/2010   proximal rectal apple core mass 10-14cm from anal verge (adenocarcinoma), 2-3cm distal rectal carpet polyp s/p piecemeal snare polypectomy (adenoma)  . COLONOSCOPY  04/21/2012   RMR: Friable,fibrotic appearing colorectal anastomosis producing some luminal narrowing-not felt to be critical. path: focal erosion with slight inflammation and hyperemia. SURVEILLANCE DUE DEC 2015  . COLONOSCOPY N/A 11/24/2013   Dr. Rourk:somewhat fibrotic/friable anastomotic mucosal-status post biopsy (narrowing not felt to be clinically significant) Single colonic diverticulum. benign polypoid rectal mucosa  . colostomy reversal  april 2013  . ERCP N/A 09/16/2018   Procedure: ENDOSCOPIC RETROGRADE CHOLANGIOPANCREATOGRAPHY (ERCP);  Surgeon: Rogene Houston, MD;  [facility];  Service: Endoscopy;  Laterality: N/A;  . ERCP N/A 07/18/2019   Procedure: ENDOSCOPIC RETROGRADE CHOLANGIOPANCREATOGRAPHY (ERCP);  Surgeon: Rogene Houston, MD;  [facility];  Service: Endoscopy;  Laterality: N/A;  . ESOPHAGOGASTRODUODENOSCOPY  07/2010   RMR: schatki ring s/p dilation, small hh, SB bx benign  . ESOPHAGOGASTRODUODENOSCOPY N/A 09/04/2015   Procedure: ESOPHAGOGASTRODUODENOSCOPY (EGD);  Surgeon: Daneil Dolin, MD;  [facility];  Service: Endoscopy;  Laterality: N/A;  730  . EUS  08/2010   Dr. Owens Loffler. uT3N0 circumferential, nearly obstruction rectosigmoid adenocarcinoma, distal edge 12cm from anal  verge  . EUS N/A 06/19/2016   Procedure: UPPER ENDOSCOPIC ULTRASOUND (EUS) RADIAL;  Surgeon: Milus Banister, MD;  Location: [facility];  Service: Endoscopy;  Laterality: N/A;  . FLEXIBLE SIGMOIDOSCOPY N/A 09/24/2018   Procedure: FLEXIBLE SIGMOIDOSCOPY;  Surgeon: Danie Binder, MD;  [facility];  Service: Endoscopy;  Laterality: N/A;  . HERNIA REPAIR     abd hernia repair  . IVC filter    . ivc filter    . port a cath placement    . PORT-A-CATH REMOVAL  09/24/2011   Procedure: REMOVAL PORT-A-CATH;  Surgeon: Donato Heinz, MD;  Location: [facility] ORS;  Service: General;  Laterality: N/A;  Minor Room  . PORTACATH PLACEMENT Right 07/07/2016   Procedure: INSERTION PORT-A-CATH;  Surgeon: Aviva Signs, MD;  Location: [facility];  Service: General;  Laterality: Right;  . SPHINCTEROTOMY N/A 09/16/2018   Procedure: SPHINCTEROTOMY with balloon dialation;  Surgeon: Rogene Houston, MD;  [facility];  Service: Endoscopy;  Laterality: N/A;  . TRANSVERSE LOOP COLOSTOMY N/A 09/29/2018   Procedure: TRANSVERSE LOOP COLOSTOMY;  Surgeon: Aviva Signs, MD;  Location: [facility];  Service: General;  Laterality: N/A;  .  VIDEO ASSISTED THORACOSCOPY (VATS)/WEDGE RESECTION Right 11/15/2014   Procedure: VIDEO ASSISTED THORACOSCOPY (VATS)/LUNG RESECTION WITH RIGHT LINGULECTOMY;  Surgeon: [name], MD;  Location: [place];  Service: Thoracic;  Laterality: Right;  Marland Kitchen VIDEO BRONCHOSCOPY N/A 11/15/2014   Procedure: VIDEO BRONCHOSCOPY;  Surgeon: [name], MD;  Location: [facility] OR;  Service: Thoracic;  Laterality: N/A;       Family History  Problem Relation Age of Onset  . Cancer Brother        throat  . Cancer Brother        prostate  . Colon cancer Neg Hx   . Liver disease Neg Hx   . Inflammatory bowel disease Neg Hx     Social History   Tobacco Use  . Smoking status: Never Smoker  . Smokeless tobacco: Never Used  Substance Use Topics  . Alcohol use: No  . Drug use: No    Home  Medications Prior to Admission medications   Medication Sig Start Date End Date Taking? Authorizing Provider  acetaminophen (TYLENOL) 500 MG tablet Take 1,000 mg by mouth every 6 (six) hours as needed for mild pain or moderate pain.    Yes [provider]  ciprofloxacin (CIPRO) 500 MG tablet Take 1 tablet (500 mg total) by mouth 2 (two) times daily. 07/18/19  Yes Tat, Shanon Brow, MD  clonazePAM (KLONOPIN) 1 MG tablet TAKE 1 TABLET BY MOUTH THREE TIMES DAILY AS NEEDED Patient taking differently: Take 1 mg by mouth 3 (three) times daily as needed for anxiety.  06/28/19  Yes Lockamy, Randi L, NP-C  gabapentin (NEURONTIN) 300 MG capsule Take 3 capsules (900 mg total) by mouth 2 (two) times daily. 06/10/19  Yes Lockamy, Randi L, NP-C  glimepiride (AMARYL) 2 MG tablet Take 2 mg by mouth 2 (two) times daily before a meal.    Yes [provider]  magnesium oxide (MAG-OX) 400 (241.3 Mg) MG tablet Take 1 tablet (400 mg total) by mouth 2 (two) times daily. 05/11/19  Yes Derek Jack, MD  mirtazapine (REMERON) 15 MG tablet Take 7.5 mg by mouth at bedtime. 05/20/19  Yes [provider]  oxyCODONE-acetaminophen (PERCOCET) 10-325 MG tablet Take 1 tablet by mouth every 8 (eight) hours as needed for pain. 07/11/19  Yes Derek Jack, MD  pantoprazole (PROTONIX) 40 MG tablet Take 40 mg by mouth every evening.  12/13/18  Yes [provider]  Probiotic Product (PROBIOTIC DAILY PO) Take 1 capsule by mouth 2 (two) times daily.    Yes [provider]  rivaroxaban (XARELTO) 20 MG TABS tablet Take 1 tablet (20 mg total) by mouth daily with supper. 09/19/18  Yes Rehman, Mechele Dawley, MD  vitamin B-12 (CYANOCOBALAMIN) 1000 MCG tablet Take 1,000 mcg by mouth daily.   Yes [provider]    Allergies    Oxycodone, Tramadol, and Trazodone and nefazodone  Review of Systems   Review of Systems  All other systems reviewed and are negative.   Physical Exam Updated Vital  Signs BP 119/66   Pulse 88   Temp 98.9 F (37.2 C) (Oral)   Resp 20   Ht 1.88 m (6\' 2" )   Wt 74.8 kg   SpO2 96%   BMI 21.18 kg/m   Physical Exam Vitals and nursing note reviewed.  Constitutional:      General: He is not in acute distress.    Appearance: He is well-developed.  HENT:     Head: Normocephalic and atraumatic.     Mouth/Throat:  Pharynx: No oropharyngeal exudate.  Eyes:     General:        Right eye: No discharge.        Left eye: No discharge.     Pupils: Pupils are equal, round, and reactive to light.     Comments: Mild jaundice present  Neck:     Thyroid: No thyromegaly.     Vascular: No JVD.  Cardiovascular:     Rate and Rhythm: Regular rhythm. Tachycardia present.     Heart sounds: Normal heart sounds. No murmur. No friction rub. No gallop.   Pulmonary:     Effort: Pulmonary effort is normal. No respiratory distress.     Breath sounds: Normal breath sounds. No wheezing or rales.  Abdominal:     General: Bowel sounds are normal. There is no distension.     Palpations: Abdomen is soft. There is no mass.     Tenderness: There is no abdominal tenderness.     Comments: Colostomy bag in place, brown stool, no blood, general abdominal exam is soft and benign, no tenderness  Musculoskeletal:        General: No tenderness. Normal range of motion.     Cervical back: Normal range of motion and neck supple.  Lymphadenopathy:     Cervical: No cervical adenopathy.  Skin:    General: Skin is warm and dry.     Findings: No erythema or rash.  Neurological:     Mental Status: He is alert.     Coordination: Coordination normal.  Psychiatric:        Behavior: Behavior normal.     ED Results / Procedures / Treatments   Labs (all labs ordered are listed, but only abnormal results are displayed) Labs Reviewed  CBC WITH DIFFERENTIAL/PLATELET - Abnormal; Notable for the following components:      Result Value   WBC 14.3 (*)    RBC 3.92 (*)    Hemoglobin 11.1  (*)    HCT 35.5 (*)    RDW 17.9 (*)    Neutro Abs 11.7 (*)    Monocytes Absolute 1.2 (*)    All other components within normal limits  LACTIC ACID, PLASMA - Abnormal; Notable for the following components:   Lactic Acid, Venous 2.0 (*)    All other components within normal limits  COMPREHENSIVE METABOLIC PANEL - Abnormal; Notable for the following components:   Sodium 133 (*)    Potassium 3.4 (*)    Glucose, Bld 111 (*)    Calcium 7.9 (*)    Total Protein 5.8 (*)    Albumin 2.6 (*)    AST 94 (*)    ALT 133 (*)    Alkaline Phosphatase 488 (*)    Total Bilirubin 3.8 (*)    All other components within normal limits  APTT - Abnormal; Notable for the following components:   aPTT 46 (*)    All other components within normal limits  PROTIME-INR - Abnormal; Notable for the following components:   Prothrombin Time 16.4 (*)    INR 1.3 (*)    All other components within normal limits  CULTURE, BLOOD (ROUTINE X 2)  CULTURE, BLOOD (ROUTINE X 2)  URINE CULTURE  LIPASE, BLOOD  LACTIC ACID, PLASMA  URINALYSIS, ROUTINE W REFLEX MICROSCOPIC    EKG EKG Interpretation  Date/Time:  Wednesday July 20 2019 22:00:39 EDT Ventricular Rate:  91 PR Interval:    QRS Duration: 93 QT Interval:  352 QTC Calculation: 433 R Axis:  49 Text Interpretation: Sinus rhythm Low voltage, precordial leads Since last tracing now regular, no ischemia Confirmed by Noemi Chapel [phone]) on 07/20/2019 10:09:06 PM   Radiology DG Chest Port 1 View  Result Date: 07/20/2019 CLINICAL DATA:  Fever EXAM: PORTABLE CHEST 1 VIEW COMPARISON:  07/16/2019 FINDINGS: Increased small right pleural effusion and right basilar atelectasis. Right lung nodular opacities again identified. No pneumothorax. Stable cardiomediastinal contours. Right chest wall port catheter tip is unchanged. IMPRESSION: Increased small right pleural effusion and right basilar atelectasis. Electronically Signed   By: Macy Mis M.D.   On: 07/20/2019  20:58    Procedures Procedures (including critical care time)  Medications Ordered in ED Medications  ceFEPIme (MAXIPIME) 2 g in sodium chloride 0.9 % 100 mL IVPB (has no administration in time range)  metroNIDAZOLE (FLAGYL) IVPB 500 mg (has no administration in time range)  gabapentin (NEURONTIN) capsule 900 mg (has no administration in time range)  clonazePAM (KLONOPIN) tablet 1 mg (has no administration in time range)  oxyCODONE-acetaminophen (PERCOCET) 10-325 MG per tablet 1 tablet (has no administration in time range)  pantoprazole (PROTONIX) EC tablet 40 mg (has no administration in time range)  rivaroxaban (XARELTO) tablet 20 mg (has no administration in time range)  magnesium oxide (MAG-OX) tablet 400 mg (has no administration in time range)  mirtazapine (REMERON) tablet 7.5 mg (has no administration in time range)  insulin aspart (novoLOG) injection 0-9 Units (has no administration in time range)  insulin aspart (novoLOG) injection 0-5 Units (has no administration in time range)  ceFEPIme (MAXIPIME) 2 g in sodium chloride 0.9 % 100 mL IVPB (0 g Intravenous Stopped 07/20/19 2138)  metroNIDAZOLE (FLAGYL) IVPB 500 mg (0 mg Intravenous Stopped 07/20/19 2248)  acetaminophen (TYLENOL) tablet 650 mg (650 mg Oral Given 07/20/19 2039)  sodium chloride 0.9 % bolus 1,000 mL (0 mLs Intravenous Stopped 07/20/19 2148)  HYDROmorphone (DILAUDID) injection 1 mg (1 mg Intravenous Given 07/20/19 2158)    ED Course  I have reviewed the triage vital signs and the nursing notes.  Pertinent labs & imaging results that were available during my care of the patient were reviewed by me and considered in my medical decision making (see chart for details).    MDM Rules/Calculators/A&P                      The patient has developed a leukocytosis of 14,000, his hemoglobin is 11.1, his lactic acid is 2.0 and his metabolic panel shows an albumin of 2.6 which seems to be chronic, creatinine of 0.6 thankfully,  an AST of 94 with an ALT of 133 and a bilirubin of 3.8 all of which are similar to his prior labs.  This patient's constitution of fever tachycardia and leukocytosis yielded a likely diagnosis of sepsis from an intra-abdominal source.  Antibiotics of been ordered, code sepsis was ordered, blood cultures have been ordered, I will discussed the case with the hospitalist for admission, this patient is critically ill with sepsis.  Awaiting GI to return page, discussed with hospitalist Dr. Alcario Drought who will admit, antibiotics being given at this time, patient's vital signs have improved significantly  Will be admitted  Final Clinical Impression(s) / ED Diagnoses Final diagnoses:  Cholangitis      Noemi Chapel, MD 07/20/19 2315

## 2019-07-20 NOTE — Telephone Encounter (Signed)
Charles [name] ,patient wife called the office. She states that he has fever abdominal pain. The pain is just like it was prior to the procedure. Still taking Cipro.  Dr.Rehman aware.He ask that the patient take the Cipro . Have patietn repeat LFT'S and CBC. If fever 101 or greater , patient to go to the Ed for further evaluation.  Charles [name] called . Patient had a fever of 102.6 last night and just a little bit ago it was 101.6. She was advised to go to the ED. Dr.Rehman will be made aware.

## 2019-07-21 ENCOUNTER — Other Ambulatory Visit (HOSPITAL_COMMUNITY): Payer: Self-pay | Admitting: Surgery

## 2019-07-21 ENCOUNTER — Telehealth (HOSPITAL_COMMUNITY): Payer: Self-pay | Admitting: *Deleted

## 2019-07-21 ENCOUNTER — Observation Stay (HOSPITAL_COMMUNITY): Payer: Medicare Other

## 2019-07-21 DIAGNOSIS — T85520A Displacement of bile duct prosthesis, initial encounter: Secondary | ICD-10-CM | POA: Diagnosis not present

## 2019-07-21 DIAGNOSIS — C20 Malignant neoplasm of rectum: Secondary | ICD-10-CM

## 2019-07-21 DIAGNOSIS — A4189 Other specified sepsis: Secondary | ICD-10-CM | POA: Diagnosis not present

## 2019-07-21 DIAGNOSIS — A419 Sepsis, unspecified organism: Secondary | ICD-10-CM | POA: Diagnosis not present

## 2019-07-21 LAB — GLUCOSE, CAPILLARY
Glucose-Capillary: 114 mg/dL — ABNORMAL HIGH (ref 70–99)
Glucose-Capillary: 92 mg/dL (ref 70–99)
Glucose-Capillary: 177 mg/dL — ABNORMAL HIGH (ref 70–99)

## 2019-07-21 LAB — COMPREHENSIVE METABOLIC PANEL
ALT: 104 U/L — ABNORMAL HIGH (ref 0–44)
AST: 79 U/L — ABNORMAL HIGH (ref 15–41)
Albumin: 2.1 g/dL — ABNORMAL LOW (ref 3.5–5.0)
Alkaline Phosphatase: 385 U/L — ABNORMAL HIGH (ref 38–126)
Anion gap: 7 (ref 5–15)
BUN: 8 mg/dL (ref 8–23)
CO2: 25 mmol/L (ref 22–32)
Calcium: 7.8 mg/dL — ABNORMAL LOW (ref 8.9–10.3)
Chloride: 105 mmol/L (ref 98–111)
Creatinine, Ser: 0.47 mg/dL — ABNORMAL LOW (ref 0.61–1.24)
GFR calc Af Amer: >60 mL/min (ref >60)
GFR calc non Af Amer: >60 mL/min (ref >60)
Glucose, Bld: 118 mg/dL — ABNORMAL HIGH (ref 70–99)
Potassium: 3.1 mmol/L — ABNORMAL LOW (ref 3.5–5.1)
Sodium: 137 mmol/L (ref 135–145)
Total Bilirubin: 3.2 mg/dL — ABNORMAL HIGH (ref 0.3–1.2)
Total Protein: 4.6 g/dL — ABNORMAL LOW (ref 6.5–8.1)

## 2019-07-21 LAB — CBC
HCT: 28.5 % — ABNORMAL LOW (ref 39.0–52.0)
Hemoglobin: 8.8 g/dL — ABNORMAL LOW (ref 13.0–17.0)
MCH: 27.4 pg (ref 26.0–34.0)
MCHC: 30.9 g/dL (ref 30.0–36.0)
MCV: 88.8 fL (ref 80.0–100.0)
Platelets: 242 10*3/uL (ref 150–400)
RBC: 3.21 MIL/uL — ABNORMAL LOW (ref 4.22–5.81)
RDW: 17.6 % — ABNORMAL HIGH (ref 11.5–15.5)
WBC: 10.3 10*3/uL (ref 4.0–10.5)
nRBC: 0 % (ref 0.0–0.2)

## 2019-07-21 LAB — SARS CORONAVIRUS 2 (TAT 6-24 HRS): SARS Coronavirus 2: NEGATIVE

## 2019-07-21 LAB — CBG MONITORING, ED: Glucose-Capillary: 102 mg/dL — ABNORMAL HIGH (ref 70–99)

## 2019-07-21 MED ORDER — AMOXICILLIN-POT CLAVULANATE 875-125 MG PO TABS: 1.0000 | ORAL_TABLET | Freq: Two times a day (BID) | ORAL | 0 refills | Status: AC

## 2019-07-21 MED ORDER — POTASSIUM CHLORIDE CRYS ER 20 MEQ PO TBCR
40.0000 meq | EXTENDED_RELEASE_TABLET | Freq: Once | ORAL | Status: AC
  Administered 2019-07-21: 40 meq via ORAL
  Filled 2019-07-21: qty 2

## 2019-07-21 NOTE — Plan of Care (Signed)
  Problem: Respiratory: Goal: Ability to maintain adequate ventilation will improve Outcome: Progressing   Problem: Nutrition: Goal: Adequate nutrition will be maintained Outcome: Progressing   Problem: Elimination: Goal: Will not experience complications related to bowel motility Outcome: Progressing   Problem: Pain Managment: Goal: General experience of comfort will improve Outcome: Progressing

## 2019-07-21 NOTE — Progress Notes (Signed)
  Subjective:  Patient has no complaints at this time.  He denies abdominal or chest pain.  He says he is hungry.  No nausea vomiting rectal bleeding reported.  Objective: Blood pressure (!) 120/59, pulse 90, temperature 99.1 F (37.3 C), temperature source Oral, resp. rate 16, height _0  (1.88 m), weight 77.4 kg, SpO2 97 %. Patient is alert. I did not examine patient as nursing staff is evaluating his colostomy. Labs/studies Results:  CBC Latest Ref Rng & Units 07/21/2019 07/20/2019 07/18/2019  WBC 4.0 - 10.5 K/uL 10.3 14.3(H) 7.7  Hemoglobin 13.0 - 17.0 g/dL 8.8(L) 11.1(L) 9.7(L)  Hematocrit 39.0 - 52.0 % 28.5(L) 35.5(L) 31.4(L)  Platelets 150 - 400 K/uL 242 297 246    CMP Latest Ref Rng & Units 07/21/2019 07/20/2019 07/18/2019  Glucose 70 - 99 mg/dL 118(H) 111(H) 94  BUN 8 - 23 mg/dL 8 10 6(L)  Creatinine 0.61 - 1.24 mg/dL 0.47(L) 0.67 0.58(L)  Sodium 135 - 145 mmol/L 137 133(L) 137  Potassium 3.5 - 5.1 mmol/L 3.1(L) 3.4(L) 3.7  Chloride 98 - 111 mmol/L 105 99 105  CO2 22 - 32 mmol/L _1 Calcium 8.9 - 10.3 mg/dL 7.8(L) 7.9(L) 8.1(L)  Total Protein 6.5 - 8.1 g/dL 4.6(L) 5.8(L) 4.8(L)  Total Bilirubin 0.3 - 1.2 mg/dL 3.2(H) 3.8(H) 4.3(H)  Alkaline Phos 38 - 126 U/L 385(H) 488(H) 465(H)  AST 15 - 41 U/L 79(H) 94(H) 167(H)  ALT 0 - 44 U/L 104(H) 133(H) 199(H)    Hepatic Function Latest Ref Rng & Units 07/21/2019 07/20/2019 07/18/2019  Total Protein 6.5 - 8.1 g/dL 4.6(L) 5.8(L) 4.8(L)  Albumin 3.5 - 5.0 g/dL 2.1(L) 2.6(L) 2.3(L)  AST 15 - 41 U/L 79(H) 94(H) 167(H)  ALT 0 - 44 U/L 104(H) 133(H) 199(H)  Alk Phosphatase 38 - 126 U/L 385(H) 488(H) 465(H)  Total Bilirubin 0.3 - 1.2 mg/dL 3.2(H) 3.8(H) 4.3(H)  Bilirubin, Direct 0.0 - 0.2 mg/dL - - -    Blood cultures positive in 1 out of 2 bottles for gram-positive cocci.  Assessment:  #1.  CBD obstruction secondary to metastatic colon carcinoma involving pancreas.  Patient underwent biliary stenting 3 days ago.  There has been  significant improvement in bilirubin alkaline phosphatase and transaminases.  Therefore stent appears to be patent.  Normal film reveals stent in good anatomic position.  I do not believe his right-sided rib cage and chest pain is of biliary origin.  #2.  Fever.  Suspect fever related to pleural disease.  At the time of ERCP I noted small amount of mucopurulent drainage along with dark bile.  Therefore I felt he could have cholangitis.  Since his LFTs are improving I do not believe fever is due to cholangitis.  Chest film yesterday revealed increasing pleural effusion.  1 out of 2 blood cultures positive for gram-positive cocci.  Dr. Manuella Ghazi is aware and feels it may be contaminant.  He is presently on cefepime and will be switched to oral antibiotic at the time of discharge.  Leukocytosis has resolved.  #3.  Anemia.  His hemoglobin is lower than it has been but it appears he has chronic anemia.  No evidence of overt GI bleed.   Recommendations:  No further GI work-up at this time. We will arrange for him to get repeat blood work at oncology clinic next week. Discussed with Dr. Manuella Ghazi.

## 2019-07-21 NOTE — Plan of Care (Signed)
  Problem: Clinical Measurements: Goal: Signs and symptoms of infection will decrease Outcome: Progressing  Pt is afebrile Problem: Clinical Measurements: Goal: Ability to maintain clinical measurements within normal limits will improve Outcome: Progressing   Problem: Activity: Goal: Risk for activity intolerance will decrease Outcome: Progressing   Problem: Nutrition: Goal: Adequate nutrition will be maintained Outcome: Progressing   Problem: Elimination: Goal: Will not experience complications related to urinary retention Outcome: Progressing   Problem: Safety: Goal: Ability to remain free from injury will improve Outcome: Progressing   Problem: Skin Integrity: Goal: Risk for impaired skin integrity will decrease Outcome: Progressing

## 2019-07-21 NOTE — Consult Note (Signed)
[facility] Nurse ostomy consult note Stoma type/location: LUQ transverse loop colostomy Stomal assessment/size: 1 and 3/8 inch from 12-6 o'clock, 1 and 3/4 inches from 3-9 o'clock.  Note that stoma protrudes and enlarges and retracts and gets smaller.  Presentation is consistent with hernia. Peristomal assessment: Skin is intact in the immediate peristomal field (4-inches away from stoma in all directions). There is stool-stained residue from medical adhesive at the midline from management of previous leakage with tape. Treatment options for stomal/peristomal skin:  Medical adhesive residue is cleansed away with numerous adhesive remove wipes. A skin barrier ring is applied in the immediate peristomal area. Output: liquid brown effluent Ostomy pouching: 2pc. Patient has been wearing a 4-inch system to delay changing. Wife is an excellent care giver. Education provided: Extended session with patient and wife for options to include in their management strategies.  Enrolled patient in [place] program: Patient is already established, but will contact again today to get them samples of paste, since they use this periodically at home and also barrier extenders.  [facility] nursing team will not follow, but will remain available to this patient, the nursing and medical teams.  Please re-consult if needed. Thanks, Maudie Flakes, MSN, RN, [place], Arther Abbott  Pager# [phone]

## 2019-07-21 NOTE — Discharge Summary (Signed)
Physician Discharge Summary  CARLUS STAY BJS:[id number] DOB: 1951-01-03 DOA: 07/20/2019  PCP: [name], MD  Admit date: 07/20/2019  Discharge date: 07/21/2019  Admitted From:Home  Disposition:  Home  Recommendations for Outpatient Follow-up:  1. Follow up with PCP in 1-2 weeks 2. Please obtain BMP/CBC in one week 3. Follow-up with Dr. [name] as scheduled next week 4. Follow-up with Dr. Laural Golden in 3 months 5. Continue Augmentin as prescribed for 7 days to complete course of treatment for presumed pneumonia as well as intra-abdominal infection.  Discontinue ciprofloxacin and other antibiotics at this time.  Home Health: None  Equipment/Devices: None  Discharge Condition: Stable  CODE STATUS: Full  Diet recommendation: Heart Healthy/carb modified  Brief/Interim Summary: Per HPI: ESTUS KRAKOWSKI is a 69 y.o. male with medical history significant of metastatic rectal CA with mets to liver, HTN, ileostomy, lung mets, DM2, DVT on Xarelto.  Pt admitted recently and just discharged on 3/15 for abd pain, elevated LFTs.  Had ERCP with biliary stent placement on 3/15.  For full recounting see DC summary by Dr. Carles Collet.  Was discharged on Cipro.  Despite this yesterday pt developed fever, this persisted today and got as high as 102.5 at home.  Associated myalgias, generalized muscle weakness.  No SOB, no vomiting, no diarrhea.  GI sent him in for IV abx and admission.  3/18: Patient was admitted with sepsis suspected to be related to an intra-abdominal/biliary source.  Chest x-ray did demonstrate some right lower lobe atelectasis/fluid accumulation with suspicion for pneumonia.  He was started on cefepime and Flagyl empirically and had repeat KUB with good stent placement noted.  Seen by Dr. Laural Golden with GI who states that patient is stable for discharge today and appears to be overall doing well.  He has had no further documented fevers and is overall feeling well.  He does  have 1 blood culture positive, which appears to be a contaminant.  He will be transitioned to oral Augmentin to cover for pneumonia as well as intra-abdominal source of infection and will follow-up as noted above.  He is noted to have decreasing hemoglobin levels that we will need to be followed up in the next 1 week and is okay to resume his home Xarelto.  He does not have any overt bleeding or other complaints or concerns.  Discharge Diagnoses:  Principal Problem:   Sepsis ([place]) Active Problems:   Rectal cancer ([place])   DVT (deep venous thrombosis) ([facility])   Type 2 diabetes mellitus ([facility])   Dilation of biliary tract  Principal discharge diagnosis: Sepsis-multifactorial with intra-abdominal/biliary source along with pneumonia.  Discharge Instructions  Discharge Instructions    Diet - low sodium heart healthy   Complete by: As directed    Increase activity slowly   Complete by: As directed      Allergies as of 07/21/2019      Reactions   Oxycodone    Blisters, hallucinations (takes Percocet at home)   Tramadol    Blisters, hallucinations   Trazodone And Nefazodone Other (See Comments)   hallucinations      Medication List    STOP taking these medications   ciprofloxacin 500 MG tablet Commonly known as: CIPRO     TAKE these medications   acetaminophen 500 MG tablet Commonly known as: TYLENOL Take 1,000 mg by mouth every 6 (six) hours as needed for mild pain or moderate pain.   amoxicillin-clavulanate 875-125 MG tablet Commonly known as: Augmentin Take 1 tablet by  mouth 2 (two) times daily for 7 days.   clonazePAM 1 MG tablet Commonly known as: KLONOPIN TAKE 1 TABLET BY MOUTH THREE TIMES DAILY AS NEEDED What changed: reasons to take this   gabapentin 300 MG capsule Commonly known as: NEURONTIN Take 3 capsules (900 mg total) by mouth 2 (two) times daily.   glimepiride 2 MG tablet Commonly known as: AMARYL Take 2 mg by mouth 2 (two) times daily before a meal.    magnesium oxide 400 (241.3 Mg) MG tablet Commonly known as: MAG-OX Take 1 tablet (400 mg total) by mouth 2 (two) times daily.   mirtazapine 15 MG tablet Commonly known as: REMERON Take 7.5 mg by mouth at bedtime.   oxyCODONE-acetaminophen 10-325 MG tablet Commonly known as: Percocet Take 1 tablet by mouth every 8 (eight) hours as needed for pain.   pantoprazole 40 MG tablet Commonly known as: PROTONIX Take 40 mg by mouth every evening.   PROBIOTIC DAILY PO Take 1 capsule by mouth 2 (two) times daily.   rivaroxaban 20 MG Tabs tablet Commonly known as: XARELTO Take 1 tablet (20 mg total) by mouth daily with supper.   vitamin B-12 1000 MCG tablet Commonly known as: CYANOCOBALAMIN Take 1,000 mcg by mouth daily.      Follow-up Information    Vyas, [name] B, MD Follow up in 1 week(s).   Specialty: Internal Medicine Contact information: [place] [address] [phone]        Rogene Houston, MD Follow up in 3 month(s).   Specialty: Gastroenterology Contact information: [address], [address] [place] [address] [phone]          Allergies  Allergen Reactions  . Oxycodone     Blisters, hallucinations (takes Percocet at home)  . Tramadol     Blisters, hallucinations  . Trazodone And Nefazodone Other (See Comments)    hallucinations    Consultations:  GI   Procedures/Studies: DG Abd 1 View  Result Date: 07/21/2019 CLINICAL DATA:  Displacement of biliary stent. EXAM: ABDOMEN - 1 VIEW COMPARISON:  ERCP 07/18/2019.  Abdomen 12/02/2010. FINDINGS: Biliary stent noted in the right upper quadrant in good anatomic position. Air noted in the biliary system most likely from recent procedure. IVC filter noted in stable position. No bowel distention or free air. Degenerative change lumbar spine. Aortoiliac atherosclerotic vascular calcification. Pelvic calcifications consistent phleboliths. IMPRESSION: Biliary stent noted the right upper quadrant in  good anatomic position. Electronically Signed   By: Marcello Moores  Register   On: 07/21/2019 11:19   CT Angio Chest PE W and/or Wo Contrast  Result Date: 07/16/2019 CLINICAL DATA:  69 year old male with right lower quadrant abdominal pain radiating to the flank area as well as chest pain. History of metastatic rectal cancer. EXAM: CT ANGIOGRAPHY CHEST CT ABDOMEN AND PELVIS WITH CONTRAST TECHNIQUE: Multidetector CT imaging of the chest was performed using the standard protocol during bolus administration of intravenous contrast. Multiplanar CT image reconstructions and MIPs were obtained to evaluate the vascular anatomy. Multidetector CT imaging of the abdomen and pelvis was performed using the standard protocol during bolus administration of intravenous contrast. CONTRAST:  134mL OMNIPAQUE IOHEXOL 350 MG/ML SOLN COMPARISON:  CT of the chest abdomen pelvis dated 04/19/2019. FINDINGS: CTA CHEST FINDINGS Cardiovascular: There is no cardiomegaly or pericardial effusion. There is 3 vessel coronary vascular calcification. Mild atherosclerotic calcification of the thoracic aorta. No aneurysmal dilatation or dissection. No pulmonary artery embolus identified. Right-sided Port-A-Cath with tip in the region  of the cavoatrial junction. Mediastinum/Nodes: Mildly enlarged right hilar lymph node measures 11 mm similar to prior CT. No new adenopathy. The esophagus and the thyroid gland are grossly unremarkable. No mediastinal fluid collection. Lungs/Pleura: There is a 1 cm right upper lobe nodule along the major fissure (series 7, image 65), similar to prior CT. Additional right upper lobe subpleural nodule (series 7, image 34) measures approximately 13 mm (previously 11 mm). A 4 mm nodule at the right lung base (series 7, image 118) previously measured 2 mm. A 5 mm nodule at the right lung base (series 7, image 125) previously measured 2 mm. There is an area of scarring in the right middle lobe inferiorly. There is a small right  pleural effusion which has increased in size since the prior CT. There is no pneumothorax. The central airways are patent. Musculoskeletal: There has been increase in the size of the mixed lytic and sclerotic metastatic disease involving the posterior right eleventh rib. There is degenerative changes of the spine. There is mixed lytic and sclerotic metastasis involving the sternum. Review of the MIP images confirms the above findings. CT ABDOMEN and PELVIS FINDINGS No intra-abdominal free air or free fluid. Hepatobiliary: Probable background of fatty infiltration of the liver. There has been interval increase in the size of the hypoenhancing lesion in the inferior right lobe of the liver (series 2, image 25) now measuring up to 2.1 cm in greatest dimension (previously approximately 7 mm). Several additional smaller hypodense lesions in segment VII appear new since the prior CT. The gallbladder is unremarkable. There is dilatation of the intrahepatic biliary tree, progressed since the prior CT. There is also dilatation of the common bile duct. The common bile duct measures approximately 10 mm in diameter, previously measured approximately 8 mm. Pancreas: There is a 3.5 x 2.4 cm hypoenhancing mass in the head/uncinate process of the pancreas (previously measuring approximately 3.8 x 2.1 cm). There is complete atrophy of the gland with dilatation of the main pancreatic duct as seen previously. Spleen: Normal in size without focal abnormality. Adrenals/Urinary Tract: There is calcification of the medial limb of the left adrenal gland, likely sequela of prior insult. The right adrenal gland is unremarkable. There is no hydronephrosis on either side. There is symmetric enhancement and excretion of contrast by both kidneys. The visualized ureters and urinary bladder appear unremarkable. Stomach/Bowel: Postsurgical changes of sigmoid resection with anastomosis. There is a left anterior abdominal loop colostomy. Anastomotic  suture is also noted in the right lower quadrant. There is mildly dilated and fecalized loop of bowel in the right hemipelvis which may represent a degree of obstruction. The appendix is normal. Vascular/Lymphatic: Mild aortoiliac atherosclerotic disease. An infrarenal IVC filter is noted. The SMV, splenic vein, and main portal vein are patent. No portal venous gas. Mildly enlarged portacaval lymph node measures approximately 11 mm in short axis (previously 7 mm). Additional mildly enlarged gastrohepatic lymph node measures approximately 11 mm in short axis. Reproductive: The prostate and seminal vesicles are grossly unremarkable. Other: None Musculoskeletal: There is degenerative changes of the spine. No acute osseous pathology. Review of the MIP images confirms the above findings. IMPRESSION: 1. No CT evidence of pulmonary artery embolus. 2. Mildly dilated and fecalized small bowel loop in the pelvis may represent a partial or early obstruction. Clinical correlation and follow-up recommended. 3. Overall progression of disease with progression of pulmonary and hepatic metastatic lesions. 4. Small right pleural effusion has increased in size since the prior CT. 5.  No significant interval change in the size of the soft tissue mass in the head/uncinate process of the pancreas. 6. Worsened biliary ductal dilatation compared to the prior CT. 7. Interval increase in the size of the mixed lytic and sclerotic lesion involving the posterior right eleventh rib. 8. Aortic Atherosclerosis (ICD10-I70.0). Electronically Signed   By: Anner Crete M.D.   On: 07/16/2019 20:07   CT ABDOMEN PELVIS W CONTRAST  Result Date: 07/16/2019 CLINICAL DATA:  69 year old male with right lower quadrant abdominal pain radiating to the flank area as well as chest pain. History of metastatic rectal cancer. EXAM: CT ANGIOGRAPHY CHEST CT ABDOMEN AND PELVIS WITH CONTRAST TECHNIQUE: Multidetector CT imaging of the chest was performed using the  standard protocol during bolus administration of intravenous contrast. Multiplanar CT image reconstructions and MIPs were obtained to evaluate the vascular anatomy. Multidetector CT imaging of the abdomen and pelvis was performed using the standard protocol during bolus administration of intravenous contrast. CONTRAST:  157mL OMNIPAQUE IOHEXOL 350 MG/ML SOLN COMPARISON:  CT of the chest abdomen pelvis dated 04/19/2019. FINDINGS: CTA CHEST FINDINGS Cardiovascular: There is no cardiomegaly or pericardial effusion. There is 3 vessel coronary vascular calcification. Mild atherosclerotic calcification of the thoracic aorta. No aneurysmal dilatation or dissection. No pulmonary artery embolus identified. Right-sided Port-A-Cath with tip in the region of the cavoatrial junction. Mediastinum/Nodes: Mildly enlarged right hilar lymph node measures 11 mm similar to prior CT. No new adenopathy. The esophagus and the thyroid gland are grossly unremarkable. No mediastinal fluid collection. Lungs/Pleura: There is a 1 cm right upper lobe nodule along the major fissure (series 7, image 65), similar to prior CT. Additional right upper lobe subpleural nodule (series 7, image 34) measures approximately 13 mm (previously 11 mm). A 4 mm nodule at the right lung base (series 7, image 118) previously measured 2 mm. A 5 mm nodule at the right lung base (series 7, image 125) previously measured 2 mm. There is an area of scarring in the right middle lobe inferiorly. There is a small right pleural effusion which has increased in size since the prior CT. There is no pneumothorax. The central airways are patent. Musculoskeletal: There has been increase in the size of the mixed lytic and sclerotic metastatic disease involving the posterior right eleventh rib. There is degenerative changes of the spine. There is mixed lytic and sclerotic metastasis involving the sternum. Review of the MIP images confirms the above findings. CT ABDOMEN and PELVIS  FINDINGS No intra-abdominal free air or free fluid. Hepatobiliary: Probable background of fatty infiltration of the liver. There has been interval increase in the size of the hypoenhancing lesion in the inferior right lobe of the liver (series 2, image 25) now measuring up to 2.1 cm in greatest dimension (previously approximately 7 mm). Several additional smaller hypodense lesions in segment VII appear new since the prior CT. The gallbladder is unremarkable. There is dilatation of the intrahepatic biliary tree, progressed since the prior CT. There is also dilatation of the common bile duct. The common bile duct measures approximately 10 mm in diameter, previously measured approximately 8 mm. Pancreas: There is a 3.5 x 2.4 cm hypoenhancing mass in the head/uncinate process of the pancreas (previously measuring approximately 3.8 x 2.1 cm). There is complete atrophy of the gland with dilatation of the main pancreatic duct as seen previously. Spleen: Normal in size without focal abnormality. Adrenals/Urinary Tract: There is calcification of the medial limb of the left adrenal gland, likely sequela of prior insult.  The right adrenal gland is unremarkable. There is no hydronephrosis on either side. There is symmetric enhancement and excretion of contrast by both kidneys. The visualized ureters and urinary bladder appear unremarkable. Stomach/Bowel: Postsurgical changes of sigmoid resection with anastomosis. There is a left anterior abdominal loop colostomy. Anastomotic suture is also noted in the right lower quadrant. There is mildly dilated and fecalized loop of bowel in the right hemipelvis which may represent a degree of obstruction. The appendix is normal. Vascular/Lymphatic: Mild aortoiliac atherosclerotic disease. An infrarenal IVC filter is noted. The SMV, splenic vein, and main portal vein are patent. No portal venous gas. Mildly enlarged portacaval lymph node measures approximately 11 mm in short axis (previously  7 mm). Additional mildly enlarged gastrohepatic lymph node measures approximately 11 mm in short axis. Reproductive: The prostate and seminal vesicles are grossly unremarkable. Other: None Musculoskeletal: There is degenerative changes of the spine. No acute osseous pathology. Review of the MIP images confirms the above findings. IMPRESSION: 1. No CT evidence of pulmonary artery embolus. 2. Mildly dilated and fecalized small bowel loop in the pelvis may represent a partial or early obstruction. Clinical correlation and follow-up recommended. 3. Overall progression of disease with progression of pulmonary and hepatic metastatic lesions. 4. Small right pleural effusion has increased in size since the prior CT. 5. No significant interval change in the size of the soft tissue mass in the head/uncinate process of the pancreas. 6. Worsened biliary ductal dilatation compared to the prior CT. 7. Interval increase in the size of the mixed lytic and sclerotic lesion involving the posterior right eleventh rib. 8. Aortic Atherosclerosis (ICD10-I70.0). Electronically Signed   By: Anner Crete M.D.   On: 07/16/2019 20:07   DG Chest Port 1 View  Result Date: 07/20/2019 CLINICAL DATA:  Fever EXAM: PORTABLE CHEST 1 VIEW COMPARISON:  07/16/2019 FINDINGS: Increased small right pleural effusion and right basilar atelectasis. Right lung nodular opacities again identified. No pneumothorax. Stable cardiomediastinal contours. Right chest wall port catheter tip is unchanged. IMPRESSION: Increased small right pleural effusion and right basilar atelectasis. Electronically Signed   By: Macy Mis M.D.   On: 07/20/2019 20:58   DG Chest Portable 1 View  Result Date: 07/16/2019 CLINICAL DATA:  Chest pain, rectal cancer EXAM: PORTABLE CHEST 1 VIEW COMPARISON:  11/25/2018 chest radiograph. FINDINGS: Right subclavian Port-A-Cath terminates in the lower third of the SVC. Stable cardiomediastinal silhouette with normal heart size. No  pneumothorax. No pleural effusion. Pulmonary nodules scattered in the mid and upper right lung, not definitely changed. No pulmonary edema. No acute consolidative airspace disease. IMPRESSION: 1. No acute cardiopulmonary disease. 2. Right pulmonary nodules, not definitely changed by chest radiographs. Electronically Signed   By: Ilona Sorrel M.D.   On: 07/16/2019 19:32   DG ERCP  Result Date: 07/18/2019 CLINICAL DATA:  History of metastatic disease. EXAM: ERCP TECHNIQUE: Multiple spot images obtained with the fluoroscopic device and submitted for interpretation post-procedure. FLUOROSCOPY TIME:  Fluoroscopy Time:  1 minutes 11 seconds COMPARISON:  CT 07/16/2019 FINDINGS: Visualization of the IVC filter. Cannulation of the biliary duct and retrograde cholangiogram was performed. Obstruction involving the distal common bile duct. Placement of a metallic stent in the distal common bile duct. Patent cystic duct. IMPRESSION: Distal common bile duct obstruction and placement of a metallic biliary stent. These images were submitted for radiologic interpretation only. Please see the procedural report for the amount of contrast and the fluoroscopy time utilized. Electronically Signed   By: Scherrie Gerlach.D.  On: 07/18/2019 15:19   US Abdomen Limited RUQ  Result Date: 07/17/2019 CLINICAL DATA:  Abdominal pain EXAM: ULTRASOUND ABDOMEN LIMITED RIGHT UPPER QUADRANT COMPARISON:  Abdominal CT from yesterday FINDINGS: Gallbladder: Sludge and small gallstones. Wall thickness is 5 mm but there is no focal tenderness. Common bile duct: Diameter: 13 mm Liver: Intrahepatic bile duct dilatation. Liver lesions by CT are not clearly seen on this study, limited by bowel gas. Portal vein is patent on color Doppler imaging with normal direction of blood flow towards the liver. IMPRESSION: 1. Cholelithiasis and sludge. There is mild gallbladder wall thickening but no over distension or focal tenderness typical of acute cholecystitis. 2.  Intra and extrahepatic bile duct dilatation in the setting of known pancreas mass. Electronically Signed   By: Monte Fantasia M.D.   On: 07/17/2019 10:36    Discharge Exam: Vitals:   07/21/19 0513 07/21/19 0944  BP: 122/76 (!) 120/59  Pulse: 87 90  Resp: 16 16  Temp: 99.3 F (37.4 C) 99.1 F (37.3 C)  SpO2: 98% 97%   Vitals:   07/21/19 0127 07/21/19 0136 07/21/19 0513 07/21/19 0944  BP: 114/65  122/76 (!) 120/59  Pulse: 88  87 90  Resp: 16  16 16   Temp: 98.8 F (37.1 C)  99.3 F (37.4 C) 99.1 F (37.3 C)  TempSrc: Oral  Oral Oral  SpO2: 100%  98% 97%  Weight:  77.4 kg    Height:  6\' 2"  (1.88 m)      General: Pt is alert, awake, not in acute distress Cardiovascular: RRR, S1/S2 +, no rubs, no gallops Respiratory: CTA bilaterally, no wheezing, no rhonchi Abdominal: Soft, NT, ND, bowel sounds + Extremities: no edema, no cyanosis    The results of significant diagnostics from this hospitalization (including imaging, microbiology, ancillary and laboratory) are listed below for reference.     Microbiology: Recent Results (from the past 240 hour(s))  SARS CORONAVIRUS 2 (TAT 6-24 HRS) Nasopharyngeal Nasopharyngeal Swab     Status: None   Collection Time: 07/16/19 11:01 PM   Specimen: Nasopharyngeal Swab  Result Value Ref Range Status   SARS Coronavirus 2 NEGATIVE NEGATIVE Final    Comment: (NOTE) SARS-CoV-2 target nucleic acids are NOT DETECTED. The SARS-CoV-2 RNA is generally detectable in upper and lower respiratory specimens during the acute phase of infection. Negative results do not preclude SARS-CoV-2 infection, do not rule out co-infections with other pathogens, and should not be used as the sole basis for treatment or other patient management decisions. Negative results must be combined with clinical observations, patient history, and epidemiological information. The expected result is Negative. Fact Sheet for  Patients: SugarRoll.be Fact Sheet for Healthcare Providers: https://www.woods-mathews.com/ This test is not yet approved or cleared by the [place] FDA and  has been authorized for detection and/or diagnosis of SARS-CoV-2 by FDA under an Emergency Use Authorization (EUA). This EUA will remain  in effect (meaning this test can be used) for the duration of the COVID-19 declaration under Section 56 4(b)(1) of the Act, 21 U.S.C. section 360bbb-3(b)(1), unless the authorization is terminated or revoked sooner. Performed at [facility], [place] [address] [place], [place] [address]   Urine culture     Status: None   Collection Time: 07/16/19 11:18 PM   Specimen: Urine, Clean Catch  Result Value Ref Range Status   Specimen Description   Final    URINE, CLEAN CATCH Performed at [facility], [address], [place], [place] [address]  Special Requests   Final    NONE Performed at [facility], [address] [place], [place] [address]    Culture   Final    NO GROWTH Performed at [facility], [place] [address], [place], [place] [address]    Report Status 07/18/2019 FINAL  Final  MRSA PCR Screening     Status: None   Collection Time: 07/18/19  2:27 AM   Specimen: Nasal Mucosa; Nasopharyngeal  Result Value Ref Range Status   MRSA by PCR NEGATIVE NEGATIVE Final    Comment:        The GeneXpert MRSA Assay (FDA approved for NASAL specimens only), is one component of a comprehensive MRSA colonization surveillance program. It is not intended to diagnose MRSA infection nor to guide or monitor treatment for MRSA infections. Performed at [facility], [address], [place], [place] [address]   Blood Culture (routine x 2)     Status: None (Preliminary result)   Collection Time: 07/20/19  5:48 PM   Specimen: Right Antecubital; Blood  Result Value Ref Range Status   Specimen Description RIGHT ANTECUBITAL  Final   Special Requests    Final    BOTTLES DRAWN AEROBIC AND ANAEROBIC Blood Culture adequate volume   Culture  Setup Time   Final    GRAM POSITIVE COCCI AEROBIC BOTTLE ONLY Gram Stain Report Called to,Read Back By and Verified With: ROPER J. AT [id number] ON [id number] BY THOMPSON S. Performed at [facility], [address] [place], [place] [address]    Culture PENDING  Incomplete   Report Status PENDING  Incomplete  Blood Culture (routine x 2)     Status: None (Preliminary result)   Collection Time: 07/20/19  8:25 PM   Specimen: BLOOD  Result Value Ref Range Status   Specimen Description BLOOD PORTA CATH  Final   Special Requests   Final    BOTTLES DRAWN AEROBIC AND ANAEROBIC Blood Culture adequate volume   Culture   Final    NO GROWTH < 12 HOURS Performed at [facility], [address] [place], [place] [id number]    Report Status PENDING  Incomplete     Labs: BNP (last 3 results) Recent Labs    09/22/18 1529  BNP 64.3   Basic Metabolic Panel: Recent Labs  Lab 07/16/19 1859 07/17/19 0544 07/18/19 0504 07/20/19 1747 07/21/19 0622  NA 133* 135 137 133* 137  K 3.6 3.2* 3.7 3.4* 3.1*  CL 101 103 105 99 105  CO2 22 24 25 24 25   GLUCOSE 183* 43* 94 111* 118*  BUN 10 8 6* 10 8  CREATININE 0.65 0.57* 0.58* 0.67 0.47*  CALCIUM 8.3* 7.9* 8.1* 7.9* 7.8*   Liver Function Tests: Recent Labs  Lab 07/16/19 1859 07/17/19 0544 07/18/19 0504 07/20/19 1747 07/21/19 0622  AST 276* 227* 167* 94* 79*  ALT 293* 250* 199* 133* 104*  ALKPHOS 596* 513* 465* 488* 385*  BILITOT 4.8* 5.1* 4.3* 3.8* 3.2*  PROT 5.6* 5.1* 4.8* 5.8* 4.6*  ALBUMIN 2.9* 2.5* 2.3* 2.6* 2.1*   Recent Labs  Lab 07/16/19 1859 07/20/19 1747  LIPASE 11 12   No results for input(s): AMMONIA in the last 168 hours. CBC: Recent Labs  Lab 07/16/19 1859 07/17/19 0544 07/18/19 0504 07/20/19 1747 07/21/19 0622  WBC 13.4* 14.3* 7.7 14.3* 10.3  NEUTROABS 10.8*  --   --  11.7*  --   HGB 11.2* 10.3* 9.7* 11.1* 8.8*  HCT 34.6* 33.4* 31.4*  35.5* 28.5*  MCV 86.9 90.0 90.0 90.6 88.8  PLT 308 267 246 297 242   Cardiac Enzymes: No results for input(s): CKTOTAL, CKMB, CKMBINDEX, TROPONINI in the last 168 hours. BNP: Invalid input(s): POCBNP CBG: Recent Labs  Lab 07/18/19 1702 07/21/19 0053 07/21/19 0610 07/21/19 0742 07/21/19 1150  GLUCAP 125* 102* 114* 92 177*   D-Dimer No results for input(s): DDIMER in the last 72 hours. Hgb A1c No results for input(s): HGBA1C in the last 72 hours. Lipid Profile No results for input(s): CHOL, HDL, LDLCALC, TRIG, CHOLHDL, LDLDIRECT in the last 72 hours. Thyroid function studies No results for input(s): TSH, T4TOTAL, T3FREE, THYROIDAB in the last 72 hours.  Invalid input(s): FREET3 Anemia work up No results for input(s): VITAMINB12, FOLATE, FERRITIN, TIBC, IRON, RETICCTPCT in the last 72 hours. Urinalysis    Component Value Date/Time   COLORURINE AMBER (A) 07/16/2019 2318   APPEARANCEUR CLEAR 07/16/2019 2318   LABSPEC >1.046 (H) 07/16/2019 2318   PHURINE 8.0 07/16/2019 2318   GLUCOSEU NEGATIVE 07/16/2019 2318   HGBUR NEGATIVE 07/16/2019 2318   BILIRUBINUR NEGATIVE 07/16/2019 2318   KETONESUR NEGATIVE 07/16/2019 2318   PROTEINUR NEGATIVE 07/16/2019 2318   UROBILINOGEN 1.0 11/14/2014 1504   NITRITE NEGATIVE 07/16/2019 2318   LEUKOCYTESUR NEGATIVE 07/16/2019 2318   Sepsis Labs Invalid input(s): PROCALCITONIN,  WBC,  LACTICIDVEN Microbiology Recent Results (from the past 240 hour(s))  SARS CORONAVIRUS 2 (TAT 6-24 HRS) Nasopharyngeal Nasopharyngeal Swab     Status: None   Collection Time: 07/16/19 11:01 PM   Specimen: Nasopharyngeal Swab  Result Value Ref Range Status   SARS Coronavirus 2 NEGATIVE NEGATIVE Final    Comment: (NOTE) SARS-CoV-2 target nucleic acids are NOT DETECTED. The SARS-CoV-2 RNA is generally detectable in upper and lower respiratory specimens during the acute phase of infection. Negative results do not preclude SARS-CoV-2 infection, do not rule  out co-infections with other pathogens, and should not be used as the sole basis for treatment or other patient management decisions. Negative results must be combined with clinical observations, patient history, and epidemiological information. The expected result is Negative. Fact Sheet for Patients: SugarRoll.be Fact Sheet for Healthcare Providers: https://www.woods-mathews.com/ This test is not yet approved or cleared by the [place] FDA and  has been authorized for detection and/or diagnosis of SARS-CoV-2 by FDA under an Emergency Use Authorization (EUA). This EUA will remain  in effect (meaning this test can be used) for the duration of the COVID-19 declaration under Section 56 4(b)(1) of the Act, 21 U.S.C. section 360bbb-3(b)(1), unless the authorization is terminated or revoked sooner. Performed at [facility], [place] [address] [place], [place] [address]   Urine culture     Status: None   Collection Time: 07/16/19 11:18 PM   Specimen: Urine, Clean Catch  Result Value Ref Range Status   Specimen Description   Final    URINE, CLEAN CATCH Performed at [facility], [address] [place], [place] [address]    Special Requests   Final    NONE Performed at [facility], [address] [place], [place] [address]    Culture   Final    NO GROWTH Performed at [facility], [place] [address], [place], [place] [address]    Report Status 07/18/2019 FINAL  Final  MRSA PCR Screening     Status: None   Collection Time: 07/18/19  2:27 AM   Specimen: Nasal Mucosa; Nasopharyngeal  Result Value Ref Range Status   MRSA by PCR NEGATIVE NEGATIVE  Final    Comment:        The GeneXpert MRSA Assay (FDA approved for NASAL specimens only), is one component of a comprehensive MRSA colonization surveillance program. It is not intended to diagnose MRSA infection nor to guide or monitor treatment for MRSA infections. Performed at  [facility], [address] [place], [place] [address]   Blood Culture (routine x 2)     Status: None (Preliminary result)   Collection Time: 07/20/19  5:48 PM   Specimen: Right Antecubital; Blood  Result Value Ref Range Status   Specimen Description RIGHT ANTECUBITAL  Final   Special Requests   Final    BOTTLES DRAWN AEROBIC AND ANAEROBIC Blood Culture adequate volume   Culture  Setup Time   Final    GRAM POSITIVE COCCI AEROBIC BOTTLE ONLY Gram Stain Report Called to,Read Back By and Verified With: ROPER J. AT [id number] ON [id number] BY THOMPSON S. Performed at [facility], [address] [place], [place] [address]    Culture PENDING  Incomplete   Report Status PENDING  Incomplete  Blood Culture (routine x 2)     Status: None (Preliminary result)   Collection Time: 07/20/19  8:25 PM   Specimen: BLOOD  Result Value Ref Range Status   Specimen Description BLOOD PORTA CATH  Final   Special Requests   Final    BOTTLES DRAWN AEROBIC AND ANAEROBIC Blood Culture adequate volume   Culture   Final    NO GROWTH < 12 HOURS Performed at [facility], [address], [place], [place] [address]    Report Status PENDING  Incomplete     Time coordinating discharge: 35 minutes  SIGNED:   Rodena Goldmann, DO [facility] 07/21/2019, 12:19 PM  If 7PM-7AM, please contact night-coverage [email or web address]

## 2019-07-21 NOTE — Progress Notes (Signed)
CRITICAL VALUE STICKER  CRITICAL VALUE: Aerobic bottle Gram + Cocci  RECEIVER (on-site recipient of call): Lauretta Chester, RN  DATE & TIME NOTIFIED: 07/21/19 at 1134  MESSENGER (representative from lab): Johnell Comings  MD NOTIFIED: Dr Mamie Nick. Manuella Ghazi  TIME OF NOTIFICATION: [id number], secure chat  RESPONSE: No new orders at this time

## 2019-07-21 NOTE — Progress Notes (Signed)
Discussed AVS including medications & need for follow up appointments with the patient as well as patient's wife. All questions fully answered. Patient taken with personal belongings to discharge area. Port was de-accessed by C. Doy Mince, Therapist, sports.

## 2019-07-21 NOTE — Progress Notes (Signed)
Port a cath in right chest deaccessed per protocol. PT tolerated well.

## 2019-07-22 ENCOUNTER — Encounter (HOSPITAL_COMMUNITY): Payer: Medicare Other

## 2019-07-23 LAB — CULTURE, BLOOD (ROUTINE X 2): Special Requests: ADEQUATE

## 2019-07-25 LAB — CULTURE, BLOOD (ROUTINE X 2)
Culture: NO GROWTH
Special Requests: ADEQUATE

## 2019-07-26 DIAGNOSIS — I10 Essential (primary) hypertension: Secondary | ICD-10-CM | POA: Diagnosis not present

## 2019-07-26 DIAGNOSIS — C20 Malignant neoplasm of rectum: Secondary | ICD-10-CM | POA: Diagnosis not present

## 2019-07-26 DIAGNOSIS — E1165 Type 2 diabetes mellitus with hyperglycemia: Secondary | ICD-10-CM | POA: Diagnosis not present

## 2019-07-26 DIAGNOSIS — Z6820 Body mass index (BMI) 20.0-20.9, adult: Secondary | ICD-10-CM | POA: Diagnosis not present

## 2019-07-26 DIAGNOSIS — Z299 Encounter for prophylactic measures, unspecified: Secondary | ICD-10-CM | POA: Diagnosis not present

## 2019-07-26 DIAGNOSIS — I2699 Other pulmonary embolism without acute cor pulmonale: Secondary | ICD-10-CM | POA: Diagnosis not present

## 2019-07-26 DIAGNOSIS — C799 Secondary malignant neoplasm of unspecified site: Secondary | ICD-10-CM | POA: Diagnosis not present

## 2019-07-27 ENCOUNTER — Encounter (HOSPITAL_COMMUNITY): Payer: Self-pay

## 2019-07-27 ENCOUNTER — Inpatient Hospital Stay (HOSPITAL_COMMUNITY): Payer: Medicare Other

## 2019-07-27 ENCOUNTER — Other Ambulatory Visit: Payer: Self-pay

## 2019-07-27 ENCOUNTER — Encounter (HOSPITAL_COMMUNITY): Payer: Self-pay | Admitting: Hematology

## 2019-07-27 ENCOUNTER — Other Ambulatory Visit (HOSPITAL_COMMUNITY): Payer: Medicare Other

## 2019-07-27 ENCOUNTER — Inpatient Hospital Stay (HOSPITAL_BASED_OUTPATIENT_CLINIC_OR_DEPARTMENT_OTHER): Payer: Medicare Other | Admitting: Hematology

## 2019-07-27 ENCOUNTER — Ambulatory Visit (HOSPITAL_COMMUNITY): Payer: Medicare Other

## 2019-07-27 DIAGNOSIS — C7889 Secondary malignant neoplasm of other digestive organs: Secondary | ICD-10-CM | POA: Diagnosis not present

## 2019-07-27 DIAGNOSIS — Z79899 Other long term (current) drug therapy: Secondary | ICD-10-CM | POA: Diagnosis not present

## 2019-07-27 DIAGNOSIS — Z5189 Encounter for other specified aftercare: Secondary | ICD-10-CM | POA: Diagnosis not present

## 2019-07-27 DIAGNOSIS — C20 Malignant neoplasm of rectum: Secondary | ICD-10-CM

## 2019-07-27 DIAGNOSIS — Z5111 Encounter for antineoplastic chemotherapy: Secondary | ICD-10-CM | POA: Diagnosis not present

## 2019-07-27 DIAGNOSIS — C7800 Secondary malignant neoplasm of unspecified lung: Secondary | ICD-10-CM | POA: Diagnosis not present

## 2019-07-27 LAB — URINALYSIS, DIPSTICK ONLY
Bilirubin Urine: NEGATIVE
Glucose, UA: NEGATIVE mg/dL
Hgb urine dipstick: NEGATIVE
Ketones, ur: NEGATIVE mg/dL
Leukocytes,Ua: NEGATIVE
Nitrite: NEGATIVE
Protein, ur: 30 mg/dL — AB
Specific Gravity, Urine: 1.023 (ref 1.005–1.030)
pH: 5 (ref 5.0–8.0)

## 2019-07-27 LAB — CBC WITH DIFFERENTIAL/PLATELET
Abs Immature Granulocytes: 0.06 10*3/uL (ref 0.00–0.07)
Basophils Absolute: 0.1 10*3/uL (ref 0.0–0.1)
Basophils Relative: 1 %
Eosinophils Absolute: 0.8 10*3/uL — ABNORMAL HIGH (ref 0.0–0.5)
Eosinophils Relative: 7 %
HCT: 34.7 % — ABNORMAL LOW (ref 39.0–52.0)
Hemoglobin: 10.6 g/dL — ABNORMAL LOW (ref 13.0–17.0)
Immature Granulocytes: 1 %
Lymphocytes Relative: 11 %
Lymphs Abs: 1.2 10*3/uL (ref 0.7–4.0)
MCH: 28.3 pg (ref 26.0–34.0)
MCHC: 30.5 g/dL (ref 30.0–36.0)
MCV: 92.8 fL (ref 80.0–100.0)
Monocytes Absolute: 0.8 10*3/uL (ref 0.1–1.0)
Monocytes Relative: 7 %
Neutro Abs: 7.9 10*3/uL — ABNORMAL HIGH (ref 1.7–7.7)
Neutrophils Relative %: 73 %
Platelets: 441 10*3/uL — ABNORMAL HIGH (ref 150–400)
RBC: 3.74 MIL/uL — ABNORMAL LOW (ref 4.22–5.81)
RDW: 17.6 % — ABNORMAL HIGH (ref 11.5–15.5)
WBC: 10.8 10*3/uL — ABNORMAL HIGH (ref 4.0–10.5)
nRBC: 0 % (ref 0.0–0.2)

## 2019-07-27 LAB — COMPREHENSIVE METABOLIC PANEL
ALT: 73 U/L — ABNORMAL HIGH (ref 0–44)
AST: 62 U/L — ABNORMAL HIGH (ref 15–41)
Albumin: 2.7 g/dL — ABNORMAL LOW (ref 3.5–5.0)
Alkaline Phosphatase: 606 U/L — ABNORMAL HIGH (ref 38–126)
Anion gap: 8 (ref 5–15)
BUN: 6 mg/dL — ABNORMAL LOW (ref 8–23)
CO2: 25 mmol/L (ref 22–32)
Calcium: 8.6 mg/dL — ABNORMAL LOW (ref 8.9–10.3)
Chloride: 103 mmol/L (ref 98–111)
Creatinine, Ser: 0.66 mg/dL (ref 0.61–1.24)
GFR calc Af Amer: >60 mL/min (ref >60)
GFR calc non Af Amer: >60 mL/min (ref >60)
Glucose, Bld: 193 mg/dL — ABNORMAL HIGH (ref 70–99)
Potassium: 4.1 mmol/L (ref 3.5–5.1)
Sodium: 136 mmol/L (ref 135–145)
Total Bilirubin: 1.6 mg/dL — ABNORMAL HIGH (ref 0.3–1.2)
Total Protein: 5.8 g/dL — ABNORMAL LOW (ref 6.5–8.1)

## 2019-07-27 LAB — MAGNESIUM: Magnesium: 1.8 mg/dL (ref 1.7–2.4)

## 2019-07-27 MED ORDER — HEPARIN SOD (PORK) LOCK FLUSH 100 UNIT/ML IV SOLN
500.0000 [IU] | Freq: Once | INTRAVENOUS | Status: AC
  Administered 2019-07-27: 500 [IU] via INTRAVENOUS

## 2019-07-27 MED ORDER — SODIUM CHLORIDE 0.9% FLUSH
10.0000 mL | INTRAVENOUS | Status: DC | PRN
  Administered 2019-07-27: 09:00:00 10 mL via INTRAVENOUS

## 2019-07-27 NOTE — Progress Notes (Signed)
ON PATHWAY REGIMEN - Colorectal  No Change  Continue With Treatment as Ordered.     A cycle is every 14 days:     Irinotecan      Leucovorin      5-Fluorouracil      5-Fluorouracil      Bevacizumab-xxxx   **Always confirm dose/schedule in your pharmacy ordering system**  Patient Characteristics: Distant Metastases, Second Line, KRAS Mutation Positive/Unknown, BRAF Wild-Type/Unknown, Bevacizumab Eligible Therapeutic Status: Distant Metastases BRAF Mutation Status: Awaiting Test Results KRAS/NRAS Mutation Status: Awaiting Test Results Line of Therapy: Second Line  Intent of Therapy: Non-Curative / Palliative Intent, Discussed with Patient

## 2019-07-27 NOTE — Progress Notes (Signed)
DISCONTINUE ON PATHWAY REGIMEN - Colorectal     A cycle is every 14 days:     Irinotecan      Leucovorin      5-Fluorouracil      5-Fluorouracil      Bevacizumab-xxxx   **Always confirm dose/schedule in your pharmacy ordering system**  REASON: Disease Progression PRIOR TREATMENT: [id number]: FOLFIRI + Bevacizumab q14 Days TREATMENT RESPONSE: Progressive Disease (PD)  START ON PATHWAY REGIMEN - Colorectal     A cycle is every 14 days:     Bevacizumab-xxxx      Oxaliplatin      Leucovorin      Fluorouracil      Fluorouracil   **Always confirm dose/schedule in your pharmacy ordering system**  Patient Characteristics: Distant Metastases, Nonsurgical Candidate, KRAS/NRAS Mutation Positive/Unknown (BRAF V600 Wild-Type/Unknown), Standard Cytotoxic Therapy, Second Line Standard Cytotoxic Therapy, Bevacizumab Eligible Tumor Location: Rectal Therapeutic Status: Distant Metastases Microsatellite/Mismatch Repair Status: MSS/pMMR BRAF Mutation Status: Wild-Type (no mutation) KRAS/NRAS Mutation Status: Mutation Positive Standard Cytotoxic Line of Therapy: Second Line Standard Cytotoxic Therapy Bevacizumab Eligibility: Eligible Intent of Therapy: Non-Curative / Palliative Intent, Discussed with Patient

## 2019-07-27 NOTE — Progress Notes (Signed)
Patient has been assessed, vital signs and labs have been reviewed by Dr. [name]. He will HOLD Tx today, changing back to FOLFOX starting next week due to progression.

## 2019-07-27 NOTE — Assessment & Plan Note (Addendum)
1.  Stage IV rectal cancer with lung and pancreatic metastasis: -K-ras mutation positive, NRAS wild-type, MSI stable. -4 cycles of FOLFIRI dose reduced from 05/11/2019 through 06/16/2019. -He was admitted to the hospital twice.  He had ERCP and stent changing on 07/18/2019. -CTAP on 07/16/2019 reviewed by me and compared to 04/19/2019. -Right lobe of the liver 2.1 cm lesion previously 7 mm.  New small hypodense lesions in segment 7.  Pancreatic mass has slightly improved. -He is currently receiving antibiotic Augmentin twice daily.  He has 3 more left.  White count is slightly elevated.  LFTs show improvement.  Total bilirubin is improved to 1.7.  CEA last time was normal. -I have recommended change in therapy. -He has previously received FOLFOX with Avastin in 2018 with no progression.  Hence I have recommended changing therapy to FOLFOX.  If he tolerates it well, we might add bevacizumab. -We discussed side effects in detail.  Plan is to start him on chemo in the next 1 to 2 weeks.  2.  Right lateral chest wall pain: -Most recent CT scan on 07/16/2019 showed right posterior rib lesion has increased. -He is taking oxycodone 10 mg 3 times a day.  He was told to increase it to 4 times a day as needed.  3.  Anxiety: -he will continue Klonopin 1 mg twice daily as it is helping.  4.  Neuropathy: -He has numbness in the feet from prior oxaliplatin. -He does not have any neuropathic pains.  He is taking gabapentin 900 mg twice daily.  5.  Weight loss: -His weight is stable in the last 4 weeks.  He is continuing nutritional supplements.  6.  Hypomagnesemia: -He will continue magnesium twice daily.  Magnesium today is 1.8.  7.  DVT and PE: -He will continue Xarelto.  He does not have any bleeding issues. 

## 2019-07-27 NOTE — Patient Instructions (Addendum)
[place] at [facility] Discharge Instructions  You were seen today by Dr. [name]. He went over your recent lab and scan results. Your scan showed a mixed response to treatment. Dr. [name] discussed switching your treatment back to FOLFOX due to the progression seen on the scan. He will hold treatment today and start you on the new treatment next week.   Thank you for choosing [place] at [facility] to provide your oncology and hematology care.  To afford each patient quality time with our provider, please arrive at least 15 minutes before your scheduled appointment time.   If you have a lab appointment with the [place] please come in thru the  Main Entrance and check in at the main information desk  You need to re-schedule your appointment should you arrive 10 or more minutes late.  We strive to give you quality time with our providers, and arriving late affects you and other patients whose appointments are after yours.  Also, if you no show three or more times for appointments you may be dismissed from the clinic at the providers discretion.     Again, thank you for choosing [facility]  Our hope is that these requests will decrease the amount of time that you wait before being seen by our physicians.       _____________________________________________________________  Should you have questions after your visit to [facility], please contact our office at [phone] between the hours of 8:00 a.m. and 4:30 p.m.  Voicemails left after 4:00 p.m. will not be returned until the following business day.  For prescription refill requests, have your pharmacy contact our office and allow 72 hours.    Cancer Center Support Programs:   > Cancer Support Group  2nd Tuesday of the month 1pm-2pm, Journey Room

## 2019-07-27 NOTE — Patient Instructions (Signed)
[place] at [facility] Discharge Instructions  Chemo tx held today and rescheduled for next week with treatment regimen change. Follow-up as scheduled. Call clinic for any questions or concerns   Thank you for choosing [place] at [facility] to provide your oncology and hematology care.  To afford each patient quality time with our provider, please arrive at least 15 minutes before your scheduled appointment time.   If you have a lab appointment with the [place] please come in thru the Main Entrance and check in at the main information desk.  You need to re-schedule your appointment should you arrive 10 or more minutes late.  We strive to give you quality time with our providers, and arriving late affects you and other patients whose appointments are after yours.  Also, if you no show three or more times for appointments you may be dismissed from the clinic at the providers discretion.     Again, thank you for choosing [facility].  Our hope is that these requests will decrease the amount of time that you wait before being seen by our physicians.       _____________________________________________________________  Should you have questions after your visit to [facility], please contact our office at [phone] between the hours of 8:00 a.m. and 4:30 p.m.  Voicemails left after 4:00 p.m. will not be returned until the following business day.  For prescription refill requests, have your pharmacy contact our office and allow 72 hours.    Due to Covid, you will need to wear a mask upon entering the hospital. If you do not have a mask, a mask will be given to you at the Main Entrance upon arrival. For doctor visits, patients may have 1 support person with them. For treatment visits, patients can not have anyone with them due to social distancing guidelines and our immunocompromised population.

## 2019-07-27 NOTE — Progress Notes (Signed)
Charles [name], Charles [name] [id number]   CLINIC:  Medical Oncology/Hematology  PCP:  [name], MD [place] [address] [phone]   REASON FOR VISIT:  Follow-up for Stage IV Colon Cancer  CURRENT THERAPY: FOLFOX  BRIEF ONCOLOGIC HISTORY:  Oncology History  Rectal cancer ([place])  07/24/2010 Initial Diagnosis   Invasive adenocarcinoma of rectum   09/09/2010 Concurrent Chemotherapy   S/P radiation and concurrent 5-FU continuous infusion from 09/09/10- 10/10/10.   11/14/2010 Surgery   S/P proctectomy with colorectal anastomosis and diverting loop ileostomy on 11/14/10 at [facility] by Dr. Harlon Ditty. Pathology reveals a pT3b N1 with 3/20 lymph nodes.   02/05/2011 - 07/14/2011 Chemotherapy   FOLFOX   08/18/2011 Surgery   Approximate date of surgery- [place] by Dr. Harlon Ditty    Remission     11/24/2013 Survivorship   Colonoscopy- somewhat fibrotic/friable anastomotic mucosal-status post biopsy (narrowing not felt to be clinically significant).  Negative pathology for malignancy   09/12/2014 Imaging   DVT in the left femoral venous system, left common iliac vein, IVC, and within the IVC filter Right lower extremity venography confirms chronic occlusion of the femoral venous system with collateralization. The right iliac venous system is patent and do   09/25/2014 PET scan   The right middle lobe pulmonary nodule is hypermetabolic, favored to represent a primary bronchogenic carcinoma.Equivocal mediastinal nodes, similar to surrounding blood pool. Bilateral adrenal hypermetabolism, felt to be physiologic   10/24/2014 Pathology Results   Lung, needle/core biopsy(ies), RML - ADENOCARCINOMA, SEE COMMENT metastatic adenocarcinoma of a colorectal primary   10/24/2014 Relapse/Recurrence     11/16/2014 Definitive Surgery   Bronchoscopy, right video-assisted thoracoscopy, wedge resection of right middle lobe by Dr. Servando Snare   11/16/2014 Pathology  Results   Lung, wedge biopsy/resection, right lingula and small portion of middle lobe - METASTATIC ADENOCARCINOMA, CONSISTENT WITH COLORECTAL PRIMARY, SPANNING 2.0 CM. - THE SURGICAL RESECTION MARGINS ARE NEGATIVE FOR ADENOCARCINOMA.   11/16/2014 Remission   THE SURGICAL RESECTION MARGINS ARE NEGATIVE FOR ADENOCARCINOMA.   03/07/2015 Imaging   CT CAP- Interval resection of right middle lobe metastasis. No acute process or evidence of metastatic disease in the chest, abdomen or pelvis. Improved right upper lobe reticular nodular opacities are favored to represent resolving infection.   09/14/2015 Imaging   CT CAP- No findings of recurrent malignancy. No recurrence along the wedge resection site of the right middle lobe. Right anterior abdominal wall focal hernia containing a knuckle of small bowel without complicating feature.   06/13/2016 Imaging   CT CAP- 1. New right-sided pleural metastasis/mass. Other smaller right-sided pulmonary nodules which are all pleural-based and most likely represent pleural metastasis. 2. No convincing evidence of abdominopelvic nodal metastasis. 3. Constellation of findings, including pancreatic atrophy, duct dilatation, and pancreatic head soft tissue fullness which are highly suspicious for pancreatic adenocarcinoma. Metastatic disease felt much less likely. Consider endoscopic ultrasound sampling or ERCP. Cannot exclude superimposed acute pancreatitis. 4. New enlargement of the appendiceal tip with subtle surrounding edema. Cannot exclude early or mild appendicitis. 5. Coronary artery atherosclerosis. Aortic atherosclerosis. 6. 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   06/13/2016 Progression   CT scan demonstrates progression of disease   06/19/2016 Procedure   EUS with FNA by Dr. Ardis Hughs   06/20/2016 Pathology Results   FINE NEEDLE ASPIRATION, ENDOSCOPIC, PANCREAS UNCINATE AREA(SPECIMEN 1 OF 1 COLLECTED 06/19/16): MALIGNANT  CELLS CONSISTENT WITH METASTATIC ADENOCARCINOMA.  06/25/2016 PET scan   1. Two pleural-based nodules in the right hemithorax are hypermetabolic. Metastatic disease is a distinct consideration. The scattered pulmonary parenchymal nodule seen on previous diagnostic CT imaging are below the threshold for reliable resolution on PET imaging. 2. Hypermetabolic lesion pancreatic head, consistent with neoplasm. As noted on prior CT, adenocarcinoma is any consideration. No evidence for hypermetabolic abdominal lymphadenopathy. Mottled uptake noted in the liver, but no discrete hepatic metastases are evident on PET imaging. 3. Appendix remains distended up to 0.9-10 mm diameter with a stone towards the tip in subtle periappendiceal edema/inflammation. The appendix is hypermetabolic along its length. Imaging features are relatively stable in the 12 day interval since prior CT scan. While appendicitis is a consideration, the relative stability over 12 days would be unusual for that etiology. Continued close follow-up recommended.   06/26/2016 Pathology Results   Not enough tissue for foundationONE or K-ras testing.   07/07/2016 Procedure   Port placed by Dr. Arnoldo Morale   07/08/2016 -  Chemotherapy   The patient had pegfilgrastim (NEULASTA) injection 6 mg, 6 mg, Subcutaneous,  Once, 0 of 4 cycles  ondansetron (ZOFRAN) IVPB 8 mg, 8 mg, Intravenous,  Once, 0 of 4 cycles  leucovorin 804 mg in dextrose 5 % 250 mL infusion, 400 mg/m2, Intravenous,  Once, 0 of 4 cycles  oxaliplatin (ELOXATIN) 170 mg in dextrose 5 % 500 mL chemo infusion, 85 mg/m2, Intravenous,  Once, 0 of 4 cycles  fluorouracil (ADRUCIL) 4,800 mg in sodium chloride 0.9 % 150 mL chemo infusion, 2,400 mg/m2 = 4,800 mg, Intravenous, 1 Day/Dose, 0 of 4 cycles  fluorouracil (ADRUCIL) chemo injection 800 mg, 400 mg/m2, Intravenous,  Once, 0 of 4 cycles  pegfilgrastim (NEULASTA) injection 6 mg, 6 mg, Subcutaneous,  Once, 2 of 2  cycles  ondansetron (ZOFRAN) IVPB 8 mg, 8 mg, Intravenous,  Once, 12 of 12 cycles  leucovorin 660 mg in dextrose 5 % 250 mL infusion, 660 mg (original dose ), Intravenous,  Once, 12 of 12 cycles Dose modification: 660 mg (Cycle 1)  oxaliplatin (ELOXATIN) 140 mg in dextrose 5 % 500 mL chemo infusion, 140 mg (original dose ), Intravenous,  Once, 12 of 12 cycles Dose modification: 140 mg (Cycle 1), 119 mg (85 % of original dose 140 mg, Cycle 8, Reason: Provider Judgment)  fluorouracil (ADRUCIL) 3,850 mg in sodium chloride 0.9 % 150 mL chemo infusion, 3,850 mg (original dose ), Intravenous, 1 Day/Dose, 12 of 12 cycles Dose modification: 3,850 mg (Cycle 1)  fluorouracil (ADRUCIL) chemo injection 700 mg, 700 mg (original dose ), Intravenous,  Once, 12 of 12 cycles Dose modification: 700 mg (Cycle 1)  palonosetron (ALOXI) injection 0.25 mg, 0.25 mg, Intravenous,  Once, 7 of 12 cycles Administration: 0.25 mg (09/30/2016)  bevacizumab (AVASTIN) 475 mg in sodium chloride 0.9 % 100 mL chemo infusion, 5 mg/kg = 475 mg, Intravenous,  Once, 6 of 11 cycles Administration: 500 mg (09/30/2016)  leucovorin 880 mg in dextrose 5 % 250 mL infusion, 400 mg/m2 = 880 mg, Intravenous,  Once, 7 of 12 cycles Administration: 880 mg (09/30/2016)  oxaliplatin (ELOXATIN) 185 mg in dextrose 5 % 500 mL chemo infusion, 85 mg/m2 = 185 mg, Intravenous,  Once, 7 of 12 cycles Administration: 185 mg (09/30/2016)  fluorouracil (ADRUCIL) chemo injection 900 mg, 400 mg/m2 = 900 mg, Intravenous,  Once, 7 of 12 cycles Administration: 900 mg (09/30/2016)  fluorouracil (ADRUCIL) 5,300 mg in sodium chloride 0.9 % 144 mL chemo infusion, 2,400 mg/m2 = 5,300  mg, Intravenous, 1 Day/Dose, 7 of 12 cycles Administration: 5,300 mg (09/30/2016)  for chemotherapy treatment.     08/27/2016 Imaging   CT CAP- 1. Pulmonary metastatic disease is stable. 2. Pancreatic head mass, grossly stable, with progressive atrophy of the body and tail of the  pancreas. Stable peripancreatic lymph node. 3. Mild circumferential rectal wall thickening, stable. 4. Aortic atherosclerosis (ICD10-170.0). Coronary artery calcification. 5. Hyperattenuating lesion off the lower pole left kidney, stable, too small to characterize. Continued attention on followup exams is warranted. 6. Persistent wall thickening and mild dilatation of the proximal appendix, of uncertain etiology. Continued attention on followup exams is warranted.   11/06/2016 Imaging   CT C/A/P: IMPRESSION: 1. Stable pulmonary metastatic disease. 2. Similar appearing pancreatic head mass. Progressed atrophy of the pancreatic body and tail. Stable peripancreatic lymph node. 3. Stable hypoattenuating lesion partially exophytic off the inferior pole of the left kidney. 4. Aortic atherosclerosis. 5. Persistent mild wall thickening and mild dilatation of the appendix.    01/30/2017 Imaging   Ct C/A/P: IMPRESSION: 1. Stable pulmonary metastatic disease. 2. Stable to slight increase in size of pancreatic head mass. 3.  Aortic Atherosclerosis (ICD10-I70.0). 4. Similar appearance of mild wall thickening of the appendix which contains an appendicolith.   05/01/2017 Imaging   CT C/A/P: Stable disease in the lungs, no progression of disease in the abdomen.   10/04/2018 -  Chemotherapy   The patient had palonosetron (ALOXI) injection 0.25 mg, 0.25 mg, Intravenous,  Once, 12 of 14 cycles Administration: 0.25 mg (10/04/2018), 0.25 mg (10/25/2018), 0.25 mg (12/01/2018), 0.25 mg (12/15/2018), 0.25 mg (12/29/2018), 0.25 mg (01/12/2019), 0.25 mg (01/26/2019), 0.25 mg (02/09/2019), 0.25 mg (05/11/2019), 0.25 mg (05/25/2019), 0.25 mg (06/08/2019), 0.25 mg (06/22/2019) pegfilgrastim (NEULASTA) injection 6 mg, 6 mg, Subcutaneous, Once, 2 of 2 cycles Administration: 6 mg (10/27/2018) pegfilgrastim-cbqv (UDENYCA) injection 6 mg, 6 mg, Subcutaneous, Once, 10 of 12 cycles Administration: 6 mg (12/03/2018), 6 mg  (12/17/2018), 6 mg (12/31/2018), 6 mg (01/14/2019), 6 mg (01/28/2019), 6 mg (02/11/2019), 6 mg (05/13/2019), 6 mg (05/27/2019), 6 mg (06/10/2019), 6 mg (06/24/2019) bevacizumab (AVASTIN) 450 mg in sodium chloride 0.9 % 100 mL chemo infusion, 5 mg/kg = 450 mg, Intravenous,  Once, 4 of 4 cycles Administration: 400 mg (12/01/2018), 400 mg (12/15/2018), 400 mg (12/29/2018) irinotecan (CAMPTOSAR) 380 mg in sodium chloride 0.9 % 500 mL chemo infusion, 180 mg/m2 = 380 mg, Intravenous,  Once, 12 of 14 cycles Dose modification: 144 mg/m2 (original dose 180 mg/m2, Cycle 2, Reason: Provider Judgment, Comment: 20% dose reduction), 90 mg/m2 (50 % of original dose 180 mg/m2, Cycle 3, Reason: Other (see comments), Comment: [id number]) Administration: 380 mg (10/04/2018), 300 mg (10/25/2018), 200 mg (12/01/2018), 200 mg (12/15/2018), 200 mg (12/29/2018), 200 mg (01/12/2019), 200 mg (01/26/2019), 200 mg (02/09/2019), 180 mg (05/11/2019), 180 mg (05/25/2019), 180 mg (06/08/2019), 180 mg (06/22/2019) leucovorin 900 mg in sodium chloride 0.9 % 250 mL infusion, 421 mg/m2 = 856 mg, Intravenous,  Once, 12 of 14 cycles Dose modification: 320 mg/m2 (original dose 400 mg/m2, Cycle 2, Reason: Provider Judgment, Comment: 20% dose reduction) Administration: 900 mg (10/04/2018), 700 mg (10/25/2018), 700 mg (12/01/2018), 700 mg (12/15/2018), 700 mg (12/29/2018), 700 mg (01/12/2019), 700 mg (01/26/2019), 700 mg (02/09/2019), 700 mg (05/11/2019), 700 mg (05/25/2019), 700 mg (06/08/2019), 700 mg (06/22/2019) fluorouracil (ADRUCIL) chemo injection 850 mg, 400 mg/m2 = 850 mg, Intravenous,  Once, 12 of 14 cycles Dose modification: 320 mg/m2 (original dose 400 mg/m2, Cycle 2, Reason: Provider Judgment, Comment: 20%  dose reduction) Administration: 850 mg (10/04/2018), 700 mg (10/25/2018), 700 mg (12/01/2018), 700 mg (12/15/2018), 700 mg (12/29/2018), 700 mg (01/12/2019), 700 mg (01/26/2019), 700 mg (02/09/2019), 700 mg (05/11/2019), 700 mg (05/25/2019), 700 mg (06/08/2019), 700 mg (06/22/2019) fosaprepitant  (EMEND) 150 mg, dexamethasone (DECADRON) 12 mg in sodium chloride 0.9 % 145 mL IVPB, , Intravenous,  Once, 10 of 12 cycles Administration:  (12/01/2018),  (12/15/2018),  (12/29/2018),  (01/12/2019),  (01/26/2019),  (02/09/2019),  (05/11/2019),  (05/25/2019),  (06/08/2019),  (06/22/2019) fluorouracil (ADRUCIL) 5,000 mg in sodium chloride 0.9 % 150 mL chemo infusion, 2,325 mg/m2 = 5,150 mg, Intravenous, 1 Day/Dose, 12 of 14 cycles Dose modification: 1,920 mg/m2 (original dose 2,400 mg/m2, Cycle 2, Reason: Provider Judgment, Comment: 20% dose reduction) Administration: 5,000 mg (10/04/2018), 4,100 mg (10/25/2018), 4,100 mg (12/01/2018), 4,100 mg (12/15/2018), 4,100 mg (12/29/2018), 4,100 mg (01/12/2019), 4,100 mg (01/26/2019), 4,100 mg (02/09/2019), 4,100 mg (05/11/2019), 4,100 mg (05/25/2019), 4,100 mg (06/08/2019), 4,100 mg (06/22/2019) bevacizumab-bvzr (ZIRABEV) 400 mg in sodium chloride 0.9 % 100 mL chemo infusion, 5 mg/kg = 400 mg (100 % of original dose 5 mg/kg), Intravenous,  Once, 4 of 6 cycles Dose modification: 5 mg/kg (original dose 5 mg/kg, Cycle 6) Administration: 400 mg (01/12/2019), 400 mg (01/26/2019), 400 mg (02/09/2019)  for chemotherapy treatment.       CANCER STAGING: Cancer Staging Rectal cancer ([place]) Staging form: Colon and Rectum, AJCC 7th Edition - Clinical: Stage IIIB (T3, N1, M0) - Signed by [name], MD on 02/04/2011 - Pathologic stage from 11/17/2014: Stage IVA (M1a) - Signed by Baird Cancer, PA-C on 09/14/2015    INTERVAL HISTORY:  Mr. Hashemi 69 y.o. male seen for follow-up of metastatic rectal cancer and toxicity assessment.  He was recently hospitalized twice.  He had ERCP and a stent changed.  He also had CT scan done of the abdomen and pelvis.  Reports pain in the right posterior chest wall slightly worsened.  He is taking oxycodone 3 times a day.  Numbness in the feet has been stable.  He takes gabapentin twice a day.  Weight has been stable.  He is taking Augmentin and has 3 more  pills left.  Denies any fevers or chills.  No right lower quadrant pain reported.  REVIEW OF SYSTEMS:  Review of Systems  Constitutional: Positive for fatigue.  Respiratory: Positive for shortness of breath.   Neurological: Positive for numbness.  Psychiatric/Behavioral: Positive for sleep disturbance.  All other systems reviewed and are negative.    PAST MEDICAL/SURGICAL HISTORY:  Past Medical History:  Diagnosis Date  . Chronic anticoagulation   . Colon cancer ([place]) 07/24/2010   rectal ca, inv adenocarcinoma  . Depression 04/01/2011  . Diabetes mellitus without complication ([place])   . DVT (deep venous thrombosis) ([place]) 05/09/2011  . GERD (gastroesophageal reflux disease)   . High output ileostomy ([place]) 05/21/2011  . History of kidney stones   . HTN (hypertension)   . Hx of radiation therapy 09/02/10 to 10/14/10   pelvis  . Lung metastasis ([place])   . Neuropathy   . Peripheral vascular disease ([place])    dvt's,pe  . Pneumonia    hx x3  . Pulmonary embolism ([place])   . Rectal cancer ([place]) 01/15/2011   S/P radiation and concurrent 5-FU continuous infusion from 09/09/10- 10/10/10.  S/P proctectomy with colorectal anastomosis and diverting loop ileostomy on 11/14/10 at [facility] by Dr. Harlon Ditty. Pathology reveals a pT3b N1 with 3/20 lymph nodes.     Past Surgical  History:  Procedure Laterality Date  . BILIARY STENT PLACEMENT N/A 09/16/2018   Procedure: STENT PLACEMENT;  Surgeon: Rogene Houston, MD;  [facility];  Service: Endoscopy;  Laterality: N/A;  . BILIARY STENT PLACEMENT N/A 07/18/2019   Procedure: BILIARY STENT PLACEMENT;  Surgeon: Rogene Houston, MD;  [facility];  Service: Endoscopy;  Laterality: N/A;  . BIOPSY  09/24/2018   Procedure: BIOPSY;  Surgeon: Danie Binder, MD;  [facility];  Service: Endoscopy;;  . COLON SURGERY  11/14/2010   proctectomy with colorectal anastomosis and diverting loop ileostomy (temporary planned)  . COLONOSCOPY   07/2010   proximal rectal apple core mass 10-14cm from anal verge (adenocarcinoma), 2-3cm distal rectal carpet polyp s/p piecemeal snare polypectomy (adenoma)  . COLONOSCOPY  04/21/2012   RMR: Friable,fibrotic appearing colorectal anastomosis producing some luminal narrowing-not felt to be critical. path: focal erosion with slight inflammation and hyperemia. SURVEILLANCE DUE DEC 2015  . COLONOSCOPY N/A 11/24/2013   Dr. Rourk:somewhat fibrotic/friable anastomotic mucosal-status post biopsy (narrowing not felt to be clinically significant) Single colonic diverticulum. benign polypoid rectal mucosa  . colostomy reversal  april 2013  . ERCP N/A 09/16/2018   Procedure: ENDOSCOPIC RETROGRADE CHOLANGIOPANCREATOGRAPHY (ERCP);  Surgeon: Rogene Houston, MD;  [facility];  Service: Endoscopy;  Laterality: N/A;  . ERCP N/A 07/18/2019   Procedure: ENDOSCOPIC RETROGRADE CHOLANGIOPANCREATOGRAPHY (ERCP);  Surgeon: Rogene Houston, MD;  [facility];  Service: Endoscopy;  Laterality: N/A;  . ESOPHAGOGASTRODUODENOSCOPY  07/2010   RMR: schatki ring s/p dilation, small hh, SB bx benign  . ESOPHAGOGASTRODUODENOSCOPY N/A 09/04/2015   Procedure: ESOPHAGOGASTRODUODENOSCOPY (EGD);  Surgeon: Daneil Dolin, MD;  [facility];  Service: Endoscopy;  Laterality: N/A;  730  . EUS  08/2010   Dr. Owens Loffler. uT3N0 circumferential, nearly obstruction rectosigmoid adenocarcinoma, distal edge 12cm from anal verge  . EUS N/A 06/19/2016   Procedure: UPPER ENDOSCOPIC ULTRASOUND (EUS) RADIAL;  Surgeon: Milus Banister, MD;  [facility];  Service: Endoscopy;  Laterality: N/A;  . FLEXIBLE SIGMOIDOSCOPY N/A 09/24/2018   Procedure: FLEXIBLE SIGMOIDOSCOPY;  Surgeon: Danie Binder, MD;  [facility];  Service: Endoscopy;  Laterality: N/A;  . HERNIA REPAIR     abd hernia repair  . IVC filter    . ivc filter    . port a cath placement    . PORT-A-CATH REMOVAL  09/24/2011    Procedure: REMOVAL PORT-A-CATH;  Surgeon: Donato Heinz, MD;  Location: [facility];  Service: General;  Laterality: N/A;  Minor Room  . PORTACATH PLACEMENT Right 07/07/2016   Procedure: INSERTION PORT-A-CATH;  Surgeon: Aviva Signs, MD;  Location: [facility];  Service: General;  Laterality: Right;  . SPHINCTEROTOMY N/A 09/16/2018   Procedure: SPHINCTEROTOMY with balloon dialation;  Surgeon: Rogene Houston, MD;  [facility];  Service: Endoscopy;  Laterality: N/A;  . TRANSVERSE LOOP COLOSTOMY N/A 09/29/2018   Procedure: TRANSVERSE LOOP COLOSTOMY;  Surgeon: Aviva Signs, MD;  Location: [facility];  Service: General;  Laterality: N/A;  . VIDEO ASSISTED THORACOSCOPY (VATS)/WEDGE RESECTION Right 11/15/2014   Procedure: VIDEO ASSISTED THORACOSCOPY (VATS)/LUNG RESECTION WITH RIGHT LINGULECTOMY;  Surgeon: [name], MD;  Location: [facility];  Service: Thoracic;  Laterality: Right;  Marland Kitchen VIDEO BRONCHOSCOPY N/A 11/15/2014   Procedure: VIDEO BRONCHOSCOPY;  Surgeon: [name], MD;  Location: [facility];  Service: Thoracic;  Laterality: N/A;     SOCIAL HISTORY:  Social History  Socioeconomic History  . Marital status: Married    Spouse name: Not on file  . Number of children: 2  . Years of education: Not on file  . Highest education level: Not on file  Occupational History  . Occupation: self-employed Regulatory affairs officer, currently not working    Fish farm manager: SELF EMPLOYED  Tobacco Use  . Smoking status: Never Smoker  . Smokeless tobacco: Never Used  Substance and Sexual Activity  . Alcohol use: No  . Drug use: No  . Sexual activity: Yes    Birth control/protection: None    Comment: married  Other Topics Concern  . Not on file  Social History Narrative  . Not on file   Social Determinants of Health   Financial Resource Strain: Low Risk   . Difficulty of Paying Living Expenses: Not very hard  Food Insecurity: No Food Insecurity  . Worried About Charity fundraiser in the Last  Year: Never true  . Ran Out of Food in the Last Year: Never true  Transportation Needs: No Transportation Needs  . Lack of Transportation (Medical): No  . Lack of Transportation (Non-Medical): No  Physical Activity: Inactive  . Days of Exercise per Week: 0 days  . Minutes of Exercise per Session: 0 min  Stress: No Stress Concern Present  . Feeling of Stress : Only a little  Social Connections: Unknown  . Frequency of Communication with Friends and Family: More than three times a week  . Frequency of Social Gatherings with Friends and Family: Once a week  . Attends Religious Services: Patient refused  . Active Member of Clubs or Organizations: Patient refused  . Attends Archivist Meetings: Patient refused  . Marital Status: Patient refused  Intimate Partner Violence: Unknown  . Fear of Current or Ex-Partner: Patient refused  . Emotionally Abused: Patient refused  . Physically Abused: Patient refused  . Sexually Abused: Patient refused    FAMILY HISTORY:  Family History  Problem Relation Age of Onset  . Cancer Brother        throat  . Cancer Brother        prostate  . Colon cancer Neg Hx   . Liver disease Neg Hx   . Inflammatory bowel disease Neg Hx     CURRENT MEDICATIONS:  Outpatient Encounter Medications as of 07/27/2019  Medication Sig  . acetaminophen (TYLENOL) 500 MG tablet Take 1,000 mg by mouth every 6 (six) hours as needed for mild pain or moderate pain.   Marland Kitchen amoxicillin-clavulanate (AUGMENTIN) 875-125 MG tablet Take 1 tablet by mouth 2 (two) times daily for 7 days.  . clonazePAM (KLONOPIN) 1 MG tablet TAKE 1 TABLET BY MOUTH THREE TIMES DAILY AS NEEDED (Patient taking differently: Take 1 mg by mouth 3 (three) times daily as needed for anxiety. )  . gabapentin (NEURONTIN) 300 MG capsule Take 3 capsules (900 mg total) by mouth 2 (two) times daily.  Marland Kitchen glimepiride (AMARYL) 2 MG tablet Take 2 mg by mouth 2 (two) times daily before a meal.   . magnesium oxide  (MAG-OX) 400 (241.3 Mg) MG tablet Take 1 tablet (400 mg total) by mouth 2 (two) times daily.  . mirtazapine (REMERON) 15 MG tablet Take 7.5 mg by mouth at bedtime.  Marland Kitchen oxyCODONE-acetaminophen (PERCOCET) 10-325 MG tablet Take 1 tablet by mouth every 8 (eight) hours as needed for pain.  . pantoprazole (PROTONIX) 40 MG tablet Take 40 mg by mouth every evening.   . Probiotic Product (PROBIOTIC DAILY PO)  Take 1 capsule by mouth 2 (two) times daily.   . rivaroxaban (XARELTO) 20 MG TABS tablet Take 1 tablet (20 mg total) by mouth daily with supper.  . vitamin B-12 (CYANOCOBALAMIN) 1000 MCG tablet Take 1,000 mcg by mouth daily.   Facility-Administered Encounter Medications as of 07/27/2019  Medication  . sodium chloride 0.9 % injection 10 mL  . sodium chloride flush (NS) 0.9 % injection 10 mL    ALLERGIES:  Allergies  Allergen Reactions  . Oxycodone     Blisters, hallucinations (takes Percocet at home)  . Tramadol     Blisters, hallucinations  . Trazodone And Nefazodone Other (See Comments)    hallucinations     PHYSICAL EXAM:  ECOG Performance status: 1  Vitals:   07/27/19 0751  BP: 112/73  Pulse: (!) 105  Resp: 18  Temp: (!) 97.3 F (36.3 C)  SpO2: 100%   Filed Weights   07/27/19 0751  Weight: 163 lb (73.9 kg)    Physical Exam Constitutional:      Appearance: Normal appearance.  HENT:     Head: Normocephalic.     Nose: Nose normal.     Mouth/Throat:     Mouth: Mucous membranes are moist.     Pharynx: Oropharynx is clear.  Eyes:     Extraocular Movements: Extraocular movements intact.     Conjunctiva/sclera: Conjunctivae normal.  Cardiovascular:     Rate and Rhythm: Regular rhythm. Tachycardia present.     Pulses: Normal pulses.     Heart sounds: Normal heart sounds.  Pulmonary:     Effort: Pulmonary effort is normal.     Breath sounds: Normal breath sounds.  Abdominal:     General: Abdomen is flat. Bowel sounds are normal.     Palpations: Abdomen is soft.    Musculoskeletal:        General: Normal range of motion.     Cervical back: Normal range of motion.  Skin:    General: Skin is warm and dry.  Neurological:     General: No focal deficit present.     Mental Status: He is alert and oriented to person, place, and time.  Psychiatric:        Mood and Affect: Mood normal.        Behavior: Behavior normal.        Thought Content: Thought content normal.        Judgment: Judgment normal.      LABORATORY DATA:  I have reviewed the labs as listed.  CBC    Component Value Date/Time   WBC 10.8 (H) 07/27/2019 0811   RBC 3.74 (L) 07/27/2019 0811   HGB 10.6 (L) 07/27/2019 0811   HGB 11.6 (L) 02/04/2011 1059   HCT 34.7 (L) 07/27/2019 0811   HCT 34.7 (L) 02/04/2011 1059   PLT 441 (H) 07/27/2019 0811   PLT 282 02/04/2011 1059   MCV 92.8 07/27/2019 0811   MCV 76.0 (L) 02/04/2011 1059   MCH 28.3 07/27/2019 0811   MCHC 30.5 07/27/2019 0811   RDW 17.6 (H) 07/27/2019 0811   RDW 18.5 (H) 02/04/2011 1059   LYMPHSABS 1.2 07/27/2019 0811   LYMPHSABS 1.1 02/04/2011 1059   MONOABS 0.8 07/27/2019 0811   MONOABS 0.5 02/04/2011 1059   EOSABS 0.8 (H) 07/27/2019 0811   EOSABS 0.1 02/04/2011 1059   BASOSABS 0.1 07/27/2019 0811   BASOSABS 0.1 02/04/2011 1059   CMP Latest Ref Rng & Units 07/27/2019 07/21/2019 07/20/2019  Glucose 70 - 99 mg/dL  193(H) 118(H) 111(H)  BUN 8 - 23 mg/dL 6(L) 8 10  Creatinine 0.61 - 1.24 mg/dL 0.66 0.47(L) 0.67  Sodium 135 - 145 mmol/L 136 137 133(L)  Potassium 3.5 - 5.1 mmol/L 4.1 3.1(L) 3.4(L)  Chloride 98 - 111 mmol/L 103 105 99  CO2 22 - 32 mmol/L '25 25 24  '$ Calcium 8.9 - 10.3 mg/dL 8.6(L) 7.8(L) 7.9(L)  Total Protein 6.5 - 8.1 g/dL 5.8(L) 4.6(L) 5.8(L)  Total Bilirubin 0.3 - 1.2 mg/dL 1.6(H) 3.2(H) 3.8(H)  Alkaline Phos 38 - 126 U/L 606(H) 385(H) 488(H)  AST 15 - 41 U/L 62(H) 79(H) 94(H)  ALT 0 - 44 U/L 73(H) 104(H) 133(H)    I have independently reviewed the scans.   ASSESSMENT & PLAN:   Rectal cancer  ([place]) 1.  Stage IV rectal cancer with lung and pancreatic metastasis: -K-ras mutation positive, NRAS wild-type, MSI stable. -4 cycles of FOLFIRI dose reduced from 05/11/2019 through 06/16/2019. -He was admitted to the hospital twice.  He had ERCP and stent changing on 07/18/2019. -CTAP on 07/16/2019 reviewed by me and compared to 04/19/2019. -Right lobe of the liver 2.1 cm lesion previously 7 mm.  New small hypodense lesions in segment 7.  Pancreatic mass has slightly improved. -He is currently receiving antibiotic Augmentin twice daily.  He has 3 more left.  White count is slightly elevated.  LFTs show improvement.  Total bilirubin is improved to 1.7.  CEA last time was normal. -I have recommended change in therapy. -He has previously received FOLFOX with Avastin in 2018 with no progression.  Hence I have recommended changing therapy to FOLFOX.  If he tolerates it well, we might add bevacizumab. -We discussed side effects in detail.  Plan is to start him on chemo in the next 1 to 2 weeks.  2.  Right lateral chest wall pain: -Most recent CT scan on 07/16/2019 showed right posterior rib lesion has increased. -He is taking oxycodone 10 mg 3 times a day.  He was told to increase it to 4 times a day as needed.  3.  Anxiety: -he will continue Klonopin 1 mg twice daily as it is helping.  4.  Neuropathy: -He has numbness in the feet from prior oxaliplatin. -He does not have any neuropathic pains.  He is taking gabapentin 900 mg twice daily.  5.  Weight loss: -His weight is stable in the last 4 weeks.  He is continuing nutritional supplements.  6.  Hypomagnesemia: -He will continue magnesium twice daily.  Magnesium today is 1.8.  7.  DVT and PE: -He will continue Xarelto.  He does not have any bleeding issues.    Orders placed this encounter:  No orders of the defined types were placed in this encounter. Total time spent is 40 minutes with more than 50% of the time spent face-to-face discussing  scan results, treatment plan change, review of medical records, counseling and coordination of care.    Derek Jack, MD  [place] [phone]

## 2019-07-27 NOTE — Progress Notes (Signed)
0920 Lab and urine results reviewed with and pt seen by Dr [name] and chemo tx to be held today and rescheduled for next week with a change in treatment regimen per MD. Pt discharged self ambulatory in satisfactory condition accompanied by his wife

## 2019-07-29 ENCOUNTER — Encounter (HOSPITAL_COMMUNITY): Payer: Medicare Other

## 2019-07-29 ENCOUNTER — Ambulatory Visit (HOSPITAL_COMMUNITY): Payer: Medicare Other

## 2019-07-29 NOTE — Progress Notes (Signed)
Nutrition  Called patient for nutrition follow-up.  No answer.  Left message for patient to return call.   Sharmila Wrobleski B. Zenia Resides, [place], [place] Registered Dietitian [phone] (pager)

## 2019-08-01 ENCOUNTER — Encounter (HOSPITAL_COMMUNITY): Payer: Self-pay | Admitting: Hematology

## 2019-08-01 ENCOUNTER — Other Ambulatory Visit (HOSPITAL_COMMUNITY): Payer: Self-pay | Admitting: Hematology

## 2019-08-01 ENCOUNTER — Inpatient Hospital Stay (HOSPITAL_COMMUNITY): Payer: Medicare Other

## 2019-08-01 ENCOUNTER — Inpatient Hospital Stay (HOSPITAL_BASED_OUTPATIENT_CLINIC_OR_DEPARTMENT_OTHER): Payer: Medicare Other | Admitting: Hematology

## 2019-08-01 ENCOUNTER — Other Ambulatory Visit: Payer: Self-pay

## 2019-08-01 VITALS — BP 124/68 | HR 100 | Temp 97.1°F | Resp 18 | Wt 159.2 lb

## 2019-08-01 DIAGNOSIS — C20 Malignant neoplasm of rectum: Secondary | ICD-10-CM

## 2019-08-01 DIAGNOSIS — Z79899 Other long term (current) drug therapy: Secondary | ICD-10-CM | POA: Diagnosis not present

## 2019-08-01 DIAGNOSIS — C7800 Secondary malignant neoplasm of unspecified lung: Secondary | ICD-10-CM | POA: Diagnosis not present

## 2019-08-01 DIAGNOSIS — Z5111 Encounter for antineoplastic chemotherapy: Secondary | ICD-10-CM | POA: Diagnosis not present

## 2019-08-01 DIAGNOSIS — C7889 Secondary malignant neoplasm of other digestive organs: Secondary | ICD-10-CM | POA: Diagnosis not present

## 2019-08-01 LAB — CBC WITH DIFFERENTIAL/PLATELET
Abs Immature Granulocytes: 0.04 10*3/uL (ref 0.00–0.07)
Basophils Absolute: 0.1 10*3/uL (ref 0.0–0.1)
Basophils Relative: 1 %
Eosinophils Absolute: 1 10*3/uL — ABNORMAL HIGH (ref 0.0–0.5)
Eosinophils Relative: 10 %
HCT: 34.5 % — ABNORMAL LOW (ref 39.0–52.0)
Hemoglobin: 10.4 g/dL — ABNORMAL LOW (ref 13.0–17.0)
Immature Granulocytes: 0 %
Lymphocytes Relative: 12 %
Lymphs Abs: 1.2 10*3/uL (ref 0.7–4.0)
MCH: 27.8 pg (ref 26.0–34.0)
MCHC: 30.1 g/dL (ref 30.0–36.0)
MCV: 92.2 fL (ref 80.0–100.0)
Monocytes Absolute: 0.6 10*3/uL (ref 0.1–1.0)
Monocytes Relative: 7 %
Neutro Abs: 6.9 10*3/uL (ref 1.7–7.7)
Neutrophils Relative %: 70 %
Platelets: 380 10*3/uL (ref 150–400)
RBC: 3.74 MIL/uL — ABNORMAL LOW (ref 4.22–5.81)
RDW: 16 % — ABNORMAL HIGH (ref 11.5–15.5)
WBC: 9.8 10*3/uL (ref 4.0–10.5)
nRBC: 0 % (ref 0.0–0.2)

## 2019-08-01 LAB — COMPREHENSIVE METABOLIC PANEL
ALT: 37 U/L (ref 0–44)
AST: 33 U/L (ref 15–41)
Albumin: 2.6 g/dL — ABNORMAL LOW (ref 3.5–5.0)
Alkaline Phosphatase: 595 U/L — ABNORMAL HIGH (ref 38–126)
Anion gap: 8 (ref 5–15)
BUN: 12 mg/dL (ref 8–23)
CO2: 25 mmol/L (ref 22–32)
Calcium: 8.3 mg/dL — ABNORMAL LOW (ref 8.9–10.3)
Chloride: 99 mmol/L (ref 98–111)
Creatinine, Ser: 0.7 mg/dL (ref 0.61–1.24)
GFR calc Af Amer: >60 mL/min (ref >60)
GFR calc non Af Amer: >60 mL/min (ref >60)
Glucose, Bld: 281 mg/dL — ABNORMAL HIGH (ref 70–99)
Potassium: 4.1 mmol/L (ref 3.5–5.1)
Sodium: 132 mmol/L — ABNORMAL LOW (ref 135–145)
Total Bilirubin: 1.4 mg/dL — ABNORMAL HIGH (ref 0.3–1.2)
Total Protein: 5.9 g/dL — ABNORMAL LOW (ref 6.5–8.1)

## 2019-08-01 LAB — MAGNESIUM: Magnesium: 1.5 mg/dL — ABNORMAL LOW (ref 1.7–2.4)

## 2019-08-01 MED ORDER — DEXTROSE 5 % IV SOLN: Freq: Once | INTRAVENOUS | Status: AC

## 2019-08-01 MED ORDER — FLUOROURACIL CHEMO INJECTION 2.5 GM/50ML
320.0000 mg/m2 | Freq: Once | INTRAVENOUS | Status: AC
  Administered 2019-08-01: 14:00:00 650 mg via INTRAVENOUS
  Filled 2019-08-01: qty 13

## 2019-08-01 MED ORDER — MAGNESIUM OXIDE 400 (241.3 MG) MG PO TABS: 400.0000 mg | ORAL_TABLET | Freq: Three times a day (TID) | ORAL | 3 refills | Status: AC

## 2019-08-01 MED ORDER — OXALIPLATIN CHEMO INJECTION 100 MG/20ML
68.0000 mg/m2 | Freq: Once | INTRAVENOUS | Status: AC
  Administered 2019-08-01: 135 mg via INTRAVENOUS
  Filled 2019-08-01: qty 20

## 2019-08-01 MED ORDER — SODIUM CHLORIDE 0.9 % IV SOLN
10.0000 mg | Freq: Once | INTRAVENOUS | Status: AC
  Administered 2019-08-01: 10 mg via INTRAVENOUS
  Filled 2019-08-01: qty 10

## 2019-08-01 MED ORDER — OXYCODONE HCL 15 MG PO TABS: 15.0000 mg | ORAL_TABLET | ORAL | 0 refills | Status: DC | PRN

## 2019-08-01 MED ORDER — PALONOSETRON HCL INJECTION 0.25 MG/5ML
0.2500 mg | Freq: Once | INTRAVENOUS | Status: AC
  Administered 2019-08-01: 0.25 mg via INTRAVENOUS
  Filled 2019-08-01: qty 5

## 2019-08-01 MED ORDER — MAGNESIUM SULFATE 2 GM/50ML IV SOLN
2.0000 g | Freq: Once | INTRAVENOUS | Status: AC
  Administered 2019-08-01: 09:00:00 2 g via INTRAVENOUS
  Filled 2019-08-01: qty 50

## 2019-08-01 MED ORDER — PROCHLORPERAZINE MALEATE 10 MG PO TABS: 10.0000 mg | ORAL_TABLET | Freq: Four times a day (QID) | ORAL | 3 refills | Status: DC | PRN

## 2019-08-01 MED ORDER — LEUCOVORIN CALCIUM INJECTION 350 MG
320.0000 mg/m2 | Freq: Once | INTRAVENOUS | Status: AC
  Administered 2019-08-01: 11:00:00 628 mg via INTRAVENOUS
  Filled 2019-08-01: qty 31.4

## 2019-08-01 MED ORDER — SODIUM CHLORIDE 0.9 % IV SOLN
1920.0000 mg/m2 | INTRAVENOUS | Status: DC
  Administered 2019-08-01: 3750 mg via INTRAVENOUS
  Filled 2019-08-01: qty 75

## 2019-08-01 NOTE — Progress Notes (Signed)
[place] [name], [place] [address]   CLINIC:  Medical Oncology/Hematology  PCP:  [name], MD [name] [id number] [phone]   REASON FOR VISIT:  Follow-up for Stage IV Colon Cancer  CURRENT THERAPY: FOLFOX  BRIEF ONCOLOGIC HISTORY:  Oncology History  Rectal cancer ([place])  07/24/2010 Initial Diagnosis   Invasive adenocarcinoma of rectum   09/09/2010 Concurrent Chemotherapy   S/P radiation and concurrent 5-FU continuous infusion from 09/09/10- 10/10/10.   11/14/2010 Surgery   S/P proctectomy with colorectal anastomosis and diverting loop ileostomy on 11/14/10 at [facility] by Dr. Harlon Ditty. Pathology reveals a pT3b N1 with 3/20 lymph nodes.   02/05/2011 - 07/14/2011 Chemotherapy   FOLFOX   08/18/2011 Surgery   Approximate date of surgery- [place] by Dr. Harlon Ditty    Remission     11/24/2013 Survivorship   Colonoscopy- somewhat fibrotic/friable anastomotic mucosal-status post biopsy (narrowing not felt to be clinically significant).  Negative pathology for malignancy   09/12/2014 Imaging   DVT in the left femoral venous system, left common iliac vein, IVC, and within the IVC filter Right lower extremity venography confirms chronic occlusion of the femoral venous system with collateralization. The right iliac venous system is patent and do   09/25/2014 PET scan   The right middle lobe pulmonary nodule is hypermetabolic, favored to represent a primary bronchogenic carcinoma.Equivocal mediastinal nodes, similar to surrounding blood pool. Bilateral adrenal hypermetabolism, felt to be physiologic   10/24/2014 Pathology Results   Lung, needle/core biopsy(ies), RML - ADENOCARCINOMA, SEE COMMENT metastatic adenocarcinoma of a colorectal primary   10/24/2014 Relapse/Recurrence     11/16/2014 Definitive Surgery   Bronchoscopy, right video-assisted thoracoscopy, wedge resection of right middle lobe by Dr. Servando Snare   11/16/2014 Pathology  Results   Lung, wedge biopsy/resection, right lingula and small portion of middle lobe - METASTATIC ADENOCARCINOMA, CONSISTENT WITH COLORECTAL PRIMARY, SPANNING 2.0 CM. - THE SURGICAL RESECTION MARGINS ARE NEGATIVE FOR ADENOCARCINOMA.   11/16/2014 Remission   THE SURGICAL RESECTION MARGINS ARE NEGATIVE FOR ADENOCARCINOMA.   03/07/2015 Imaging   CT CAP- Interval resection of right middle lobe metastasis. No acute process or evidence of metastatic disease in the chest, abdomen or pelvis. Improved right upper lobe reticular nodular opacities are favored to represent resolving infection.   09/14/2015 Imaging   CT CAP- No findings of recurrent malignancy. No recurrence along the wedge resection site of the right middle lobe. Right anterior abdominal wall focal hernia containing a knuckle of small bowel without complicating feature.   06/13/2016 Imaging   CT CAP- 1. New right-sided pleural metastasis/mass. Other smaller right-sided pulmonary nodules which are all pleural-based and most likely represent pleural metastasis. 2. No convincing evidence of abdominopelvic nodal metastasis. 3. Constellation of findings, including pancreatic atrophy, duct dilatation, and pancreatic head soft tissue fullness which are highly suspicious for pancreatic adenocarcinoma. Metastatic disease felt much less likely. Consider endoscopic ultrasound sampling or ERCP. Cannot exclude superimposed acute pancreatitis. 4. New enlargement of the appendiceal tip with subtle surrounding edema. Cannot exclude early or mild appendicitis. 5. Coronary artery atherosclerosis. Aortic atherosclerosis. 6. 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   06/13/2016 Progression   CT scan demonstrates progression of disease   06/19/2016 Procedure   EUS with FNA by Dr. Ardis Hughs   06/20/2016 Pathology Results   FINE NEEDLE ASPIRATION, ENDOSCOPIC, PANCREAS UNCINATE AREA(SPECIMEN 1 OF 1 COLLECTED 06/19/16): MALIGNANT  CELLS CONSISTENT WITH METASTATIC ADENOCARCINOMA.  06/25/2016 PET scan   1. Two pleural-based nodules in the right hemithorax are hypermetabolic. Metastatic disease is a distinct consideration. The scattered pulmonary parenchymal nodule seen on previous diagnostic CT imaging are below the threshold for reliable resolution on PET imaging. 2. Hypermetabolic lesion pancreatic head, consistent with neoplasm. As noted on prior CT, adenocarcinoma is any consideration. No evidence for hypermetabolic abdominal lymphadenopathy. Mottled uptake noted in the liver, but no discrete hepatic metastases are evident on PET imaging. 3. Appendix remains distended up to 0.9-10 mm diameter with a stone towards the tip in subtle periappendiceal edema/inflammation. The appendix is hypermetabolic along its length. Imaging features are relatively stable in the 12 day interval since prior CT scan. While appendicitis is a consideration, the relative stability over 12 days would be unusual for that etiology. Continued close follow-up recommended.   06/26/2016 Pathology Results   Not enough tissue for foundationONE or K-ras testing.   07/07/2016 Procedure   Port placed by Dr. Arnoldo Morale   07/08/2016 -  Chemotherapy   The patient had pegfilgrastim (NEULASTA) injection 6 mg, 6 mg, Subcutaneous,  Once, 0 of 4 cycles  ondansetron (ZOFRAN) IVPB 8 mg, 8 mg, Intravenous,  Once, 0 of 4 cycles  leucovorin 804 mg in dextrose 5 % 250 mL infusion, 400 mg/m2, Intravenous,  Once, 0 of 4 cycles  oxaliplatin (ELOXATIN) 170 mg in dextrose 5 % 500 mL chemo infusion, 85 mg/m2, Intravenous,  Once, 0 of 4 cycles  fluorouracil (ADRUCIL) 4,800 mg in sodium chloride 0.9 % 150 mL chemo infusion, 2,400 mg/m2 = 4,800 mg, Intravenous, 1 Day/Dose, 0 of 4 cycles  fluorouracil (ADRUCIL) chemo injection 800 mg, 400 mg/m2, Intravenous,  Once, 0 of 4 cycles  pegfilgrastim (NEULASTA) injection 6 mg, 6 mg, Subcutaneous,  Once, 2 of 2  cycles  ondansetron (ZOFRAN) IVPB 8 mg, 8 mg, Intravenous,  Once, 12 of 12 cycles  leucovorin 660 mg in dextrose 5 % 250 mL infusion, 660 mg (original dose ), Intravenous,  Once, 12 of 12 cycles Dose modification: 660 mg (Cycle 1)  oxaliplatin (ELOXATIN) 140 mg in dextrose 5 % 500 mL chemo infusion, 140 mg (original dose ), Intravenous,  Once, 12 of 12 cycles Dose modification: 140 mg (Cycle 1), 119 mg (85 % of original dose 140 mg, Cycle 8, Reason: Provider Judgment)  fluorouracil (ADRUCIL) 3,850 mg in sodium chloride 0.9 % 150 mL chemo infusion, 3,850 mg (original dose ), Intravenous, 1 Day/Dose, 12 of 12 cycles Dose modification: 3,850 mg (Cycle 1)  fluorouracil (ADRUCIL) chemo injection 700 mg, 700 mg (original dose ), Intravenous,  Once, 12 of 12 cycles Dose modification: 700 mg (Cycle 1)  palonosetron (ALOXI) injection 0.25 mg, 0.25 mg, Intravenous,  Once, 7 of 12 cycles Administration: 0.25 mg (09/30/2016)  bevacizumab (AVASTIN) 475 mg in sodium chloride 0.9 % 100 mL chemo infusion, 5 mg/kg = 475 mg, Intravenous,  Once, 6 of 11 cycles Administration: 500 mg (09/30/2016)  leucovorin 880 mg in dextrose 5 % 250 mL infusion, 400 mg/m2 = 880 mg, Intravenous,  Once, 7 of 12 cycles Administration: 880 mg (09/30/2016)  oxaliplatin (ELOXATIN) 185 mg in dextrose 5 % 500 mL chemo infusion, 85 mg/m2 = 185 mg, Intravenous,  Once, 7 of 12 cycles Administration: 185 mg (09/30/2016)  fluorouracil (ADRUCIL) chemo injection 900 mg, 400 mg/m2 = 900 mg, Intravenous,  Once, 7 of 12 cycles Administration: 900 mg (09/30/2016)  fluorouracil (ADRUCIL) 5,300 mg in sodium chloride 0.9 % 144 mL chemo infusion, 2,400 mg/m2 = 5,300  mg, Intravenous, 1 Day/Dose, 7 of 12 cycles Administration: 5,300 mg (09/30/2016)  for chemotherapy treatment.     08/27/2016 Imaging   CT CAP- 1. Pulmonary metastatic disease is stable. 2. Pancreatic head mass, grossly stable, with progressive atrophy of the body and tail of the  pancreas. Stable peripancreatic lymph node. 3. Mild circumferential rectal wall thickening, stable. 4. Aortic atherosclerosis (ICD10-170.0). Coronary artery calcification. 5. Hyperattenuating lesion off the lower pole left kidney, stable, too small to characterize. Continued attention on followup exams is warranted. 6. Persistent wall thickening and mild dilatation of the proximal appendix, of uncertain etiology. Continued attention on followup exams is warranted.   11/06/2016 Imaging   CT C/A/P: IMPRESSION: 1. Stable pulmonary metastatic disease. 2. Similar appearing pancreatic head mass. Progressed atrophy of the pancreatic body and tail. Stable peripancreatic lymph node. 3. Stable hypoattenuating lesion partially exophytic off the inferior pole of the left kidney. 4. Aortic atherosclerosis. 5. Persistent mild wall thickening and mild dilatation of the appendix.    01/30/2017 Imaging   Ct C/A/P: IMPRESSION: 1. Stable pulmonary metastatic disease. 2. Stable to slight increase in size of pancreatic head mass. 3.  Aortic Atherosclerosis (ICD10-I70.0). 4. Similar appearance of mild wall thickening of the appendix which contains an appendicolith.   05/01/2017 Imaging   CT C/A/P: Stable disease in the lungs, no progression of disease in the abdomen.   10/04/2018 - 07/26/2019 Chemotherapy   The patient had palonosetron (ALOXI) injection 0.25 mg, 0.25 mg, Intravenous,  Once, 12 of 14 cycles Administration: 0.25 mg (10/04/2018), 0.25 mg (10/25/2018), 0.25 mg (12/01/2018), 0.25 mg (12/15/2018), 0.25 mg (12/29/2018), 0.25 mg (01/12/2019), 0.25 mg (01/26/2019), 0.25 mg (02/09/2019), 0.25 mg (05/11/2019), 0.25 mg (05/25/2019), 0.25 mg (06/08/2019), 0.25 mg (06/22/2019) pegfilgrastim (NEULASTA) injection 6 mg, 6 mg, Subcutaneous, Once, 2 of 2 cycles Administration: 6 mg (10/27/2018) pegfilgrastim-cbqv (UDENYCA) injection 6 mg, 6 mg, Subcutaneous, Once, 10 of 12 cycles Administration: 6 mg (12/03/2018), 6 mg  (12/17/2018), 6 mg (12/31/2018), 6 mg (01/14/2019), 6 mg (01/28/2019), 6 mg (02/11/2019), 6 mg (05/13/2019), 6 mg (05/27/2019), 6 mg (06/10/2019), 6 mg (06/24/2019) bevacizumab (AVASTIN) 450 mg in sodium chloride 0.9 % 100 mL chemo infusion, 5 mg/kg = 450 mg, Intravenous,  Once, 4 of 4 cycles Administration: 400 mg (12/01/2018), 400 mg (12/15/2018), 400 mg (12/29/2018) irinotecan (CAMPTOSAR) 380 mg in sodium chloride 0.9 % 500 mL chemo infusion, 180 mg/m2 = 380 mg, Intravenous,  Once, 12 of 14 cycles Dose modification: 144 mg/m2 (original dose 180 mg/m2, Cycle 2, Reason: Provider Judgment, Comment: 20% dose reduction), 90 mg/m2 (50 % of original dose 180 mg/m2, Cycle 3, Reason: Other (see comments), Comment: [id number]) Administration: 380 mg (10/04/2018), 300 mg (10/25/2018), 200 mg (12/01/2018), 200 mg (12/15/2018), 200 mg (12/29/2018), 200 mg (01/12/2019), 200 mg (01/26/2019), 200 mg (02/09/2019), 180 mg (05/11/2019), 180 mg (05/25/2019), 180 mg (06/08/2019), 180 mg (06/22/2019) leucovorin 900 mg in sodium chloride 0.9 % 250 mL infusion, 421 mg/m2 = 856 mg, Intravenous,  Once, 12 of 14 cycles Dose modification: 320 mg/m2 (original dose 400 mg/m2, Cycle 2, Reason: Provider Judgment, Comment: 20% dose reduction) Administration: 900 mg (10/04/2018), 700 mg (10/25/2018), 700 mg (12/01/2018), 700 mg (12/15/2018), 700 mg (12/29/2018), 700 mg (01/12/2019), 700 mg (01/26/2019), 700 mg (02/09/2019), 700 mg (05/11/2019), 700 mg (05/25/2019), 700 mg (06/08/2019), 700 mg (06/22/2019) fluorouracil (ADRUCIL) chemo injection 850 mg, 400 mg/m2 = 850 mg, Intravenous,  Once, 12 of 14 cycles Dose modification: 320 mg/m2 (original dose 400 mg/m2, Cycle 2, Reason: Provider Judgment, Comment: 20%  dose reduction) Administration: 850 mg (10/04/2018), 700 mg (10/25/2018), 700 mg (12/01/2018), 700 mg (12/15/2018), 700 mg (12/29/2018), 700 mg (01/12/2019), 700 mg (01/26/2019), 700 mg (02/09/2019), 700 mg (05/11/2019), 700 mg (05/25/2019), 700 mg (06/08/2019), 700 mg (06/22/2019) fosaprepitant  (EMEND) 150 mg, dexamethasone (DECADRON) 12 mg in sodium chloride 0.9 % 145 mL IVPB, , Intravenous,  Once, 10 of 12 cycles Administration:  (12/01/2018),  (12/15/2018),  (12/29/2018),  (01/12/2019),  (01/26/2019),  (02/09/2019),  (05/11/2019),  (05/25/2019),  (06/08/2019),  (06/22/2019) fluorouracil (ADRUCIL) 5,000 mg in sodium chloride 0.9 % 150 mL chemo infusion, 2,325 mg/m2 = 5,150 mg, Intravenous, 1 Day/Dose, 12 of 14 cycles Dose modification: 1,920 mg/m2 (original dose 2,400 mg/m2, Cycle 2, Reason: Provider Judgment, Comment: 20% dose reduction) Administration: 5,000 mg (10/04/2018), 4,100 mg (10/25/2018), 4,100 mg (12/01/2018), 4,100 mg (12/15/2018), 4,100 mg (12/29/2018), 4,100 mg (01/12/2019), 4,100 mg (01/26/2019), 4,100 mg (02/09/2019), 4,100 mg (05/11/2019), 4,100 mg (05/25/2019), 4,100 mg (06/08/2019), 4,100 mg (06/22/2019) bevacizumab-bvzr (ZIRABEV) 400 mg in sodium chloride 0.9 % 100 mL chemo infusion, 5 mg/kg = 400 mg (100 % of original dose 5 mg/kg), Intravenous,  Once, 4 of 6 cycles Dose modification: 5 mg/kg (original dose 5 mg/kg, Cycle 6) Administration: 400 mg (01/12/2019), 400 mg (01/26/2019), 400 mg (02/09/2019)  for chemotherapy treatment.    08/01/2019 -  Chemotherapy   The patient had palonosetron (ALOXI) injection 0.25 mg, 0.25 mg, Intravenous,  Once, 1 of 4 cycles Administration: 0.25 mg (08/01/2019) pegfilgrastim-cbqv (UDENYCA) injection 6 mg, 6 mg, Subcutaneous, Once, 1 of 4 cycles leucovorin 628 mg in dextrose 5 % 250 mL infusion, 320 mg/m2 = 628 mg (80 % of original dose 400 mg/m2), Intravenous,  Once, 1 of 4 cycles Dose modification: 320 mg/m2 (80 % of original dose 400 mg/m2, Cycle 1, Reason: Change in LFTs) Administration: 628 mg (08/01/2019) oxaliplatin (ELOXATIN) 135 mg in dextrose 5 % 500 mL chemo infusion, 68 mg/m2 = 135 mg (80 % of original dose 85 mg/m2), Intravenous,  Once, 1 of 4 cycles Dose modification: 68 mg/m2 (80 % of original dose 85 mg/m2, Cycle 1, Reason: Change in  LFTs) Administration: 135 mg (08/01/2019) fluorouracil (ADRUCIL) chemo injection 650 mg, 320 mg/m2 = 650 mg (80 % of original dose 400 mg/m2), Intravenous,  Once, 1 of 4 cycles Dose modification: 320 mg/m2 (80 % of original dose 400 mg/m2, Cycle 1, Reason: Change in LFTs) Administration: 650 mg (08/01/2019) fluorouracil (ADRUCIL) 3,750 mg in sodium chloride 0.9 % 75 mL chemo infusion, 1,920 mg/m2 = 3,750 mg (80 % of original dose 2,400 mg/m2), Intravenous, 1 Day/Dose, 1 of 4 cycles Dose modification: 1,920 mg/m2 (80 % of original dose 2,400 mg/m2, Cycle 1, Reason: Change in LFTs) Administration: 3,750 mg (08/01/2019) bevacizumab-bvzr (ZIRABEV) 400 mg in sodium chloride 0.9 % 100 mL chemo infusion, 5 mg/kg = 400 mg, Intravenous,  Once, 0 of 3 cycles  for chemotherapy treatment.       CANCER STAGING: Cancer Staging Rectal cancer (Advance) Staging form: Colon and Rectum, AJCC 7th Edition - Clinical: Stage IIIB (T3, N1, M0) - Signed by [name], MD on 02/04/2011 - Pathologic stage from 11/17/2014: Stage IVA (M1a) - Signed by Baird Cancer, PA-C on 09/14/2015    INTERVAL HISTORY:  Mr. Coury 69 y.o. male seen for follow-up of metastatic rectal cancer and toxicity assessment prior to chemotherapy.  He reported worsening of his right lateral rib pain.  He is taking Percocet 10/325 every 4 hours.  It is only helping for 2 to 3  hours.  Does not report any drowsiness or constipation.  He lost 4 pounds as he was unable to eat because of pain.  Appetite and energy levels are 50%.  REVIEW OF SYSTEMS:  Review of Systems  Respiratory: Positive for shortness of breath.   Cardiovascular: Positive for chest pain.  Neurological: Positive for numbness.  Psychiatric/Behavioral: Positive for sleep disturbance.  All other systems reviewed and are negative.    PAST MEDICAL/SURGICAL HISTORY:  Past Medical History:  Diagnosis Date  . Chronic anticoagulation   . Colon cancer ([place]) 07/24/2010    rectal ca, inv adenocarcinoma  . Depression 04/01/2011  . Diabetes mellitus without complication ([place])   . DVT (deep venous thrombosis) ([place]) 05/09/2011  . GERD (gastroesophageal reflux disease)   . High output ileostomy ([place]) 05/21/2011  . History of kidney stones   . HTN (hypertension)   . Hx of radiation therapy 09/02/10 to 10/14/10   pelvis  . Lung metastasis ([place])   . Neuropathy   . Peripheral vascular disease ([place])    dvt's,pe  . Pneumonia    hx x3  . Pulmonary embolism ([place])   . Rectal cancer ([place]) 01/15/2011   S/P radiation and concurrent 5-FU continuous infusion from 09/09/10- 10/10/10.  S/P proctectomy with colorectal anastomosis and diverting loop ileostomy on 11/14/10 at [facility] by Dr. Harlon Ditty. Pathology reveals a pT3b N1 with 3/20 lymph nodes.     Past Surgical History:  Procedure Laterality Date  . BILIARY STENT PLACEMENT N/A 09/16/2018   Procedure: STENT PLACEMENT;  Surgeon: Rogene Houston, MD;  [facility];  Service: Endoscopy;  Laterality: N/A;  . BILIARY STENT PLACEMENT N/A 07/18/2019   Procedure: BILIARY STENT PLACEMENT;  Surgeon: Rogene Houston, MD;  [facility];  Service: Endoscopy;  Laterality: N/A;  . BIOPSY  09/24/2018   Procedure: BIOPSY;  Surgeon: Danie Binder, MD;  [facility];  Service: Endoscopy;;  . COLON SURGERY  11/14/2010   proctectomy with colorectal anastomosis and diverting loop ileostomy (temporary planned)  . COLONOSCOPY  07/2010   proximal rectal apple core mass 10-14cm from anal verge (adenocarcinoma), 2-3cm distal rectal carpet polyp s/p piecemeal snare polypectomy (adenoma)  . COLONOSCOPY  04/21/2012   RMR: Friable,fibrotic appearing colorectal anastomosis producing some luminal narrowing-not felt to be critical. path: focal erosion with slight inflammation and hyperemia. SURVEILLANCE DUE DEC 2015  . COLONOSCOPY N/A 11/24/2013   Dr. Rourk:somewhat fibrotic/friable anastomotic mucosal-status post biopsy  (narrowing not felt to be clinically significant) Single colonic diverticulum. benign polypoid rectal mucosa  . colostomy reversal  april 2013  . ERCP N/A 09/16/2018   Procedure: ENDOSCOPIC RETROGRADE CHOLANGIOPANCREATOGRAPHY (ERCP);  Surgeon: Rogene Houston, MD;  [facility];  Service: Endoscopy;  Laterality: N/A;  . ERCP N/A 07/18/2019   Procedure: ENDOSCOPIC RETROGRADE CHOLANGIOPANCREATOGRAPHY (ERCP);  Surgeon: Rogene Houston, MD;  [facility];  Service: Endoscopy;  Laterality: N/A;  . ESOPHAGOGASTRODUODENOSCOPY  07/2010   RMR: schatki ring s/p dilation, small hh, SB bx benign  . ESOPHAGOGASTRODUODENOSCOPY N/A 09/04/2015   Procedure: ESOPHAGOGASTRODUODENOSCOPY (EGD);  Surgeon: Daneil Dolin, MD;  [facility];  Service: Endoscopy;  Laterality: N/A;  730  . EUS  08/2010   Dr. Owens Loffler. uT3N0 circumferential, nearly obstruction rectosigmoid adenocarcinoma, distal edge 12cm from anal verge  . EUS N/A 06/19/2016   Procedure: UPPER ENDOSCOPIC ULTRASOUND (EUS) RADIAL;  Surgeon: Milus Banister, MD;  [facility]: [facility];  Service: Endoscopy;  Laterality:  N/A;  . FLEXIBLE SIGMOIDOSCOPY N/A 09/24/2018   Procedure: FLEXIBLE SIGMOIDOSCOPY;  Surgeon: Danie Binder, MD;  [facility];  Service: Endoscopy;  Laterality: N/A;  . HERNIA REPAIR     abd hernia repair  . IVC filter    . ivc filter    . port a cath placement    . PORT-A-CATH REMOVAL  09/24/2011   Procedure: REMOVAL PORT-A-CATH;  Surgeon: Donato Heinz, MD;  Location: [facility];  Service: General;  Laterality: N/A;  Minor Room  . PORTACATH PLACEMENT Right 07/07/2016   Procedure: INSERTION PORT-A-CATH;  Surgeon: Aviva Signs, MD;  Location: [facility];  Service: General;  Laterality: Right;  . SPHINCTEROTOMY N/A 09/16/2018   Procedure: SPHINCTEROTOMY with balloon dialation;  Surgeon: Rogene Houston, MD;  [facility];  Service: Endoscopy;  Laterality: N/A;  . TRANSVERSE LOOP COLOSTOMY  N/A 09/29/2018   Procedure: TRANSVERSE LOOP COLOSTOMY;  Surgeon: Aviva Signs, MD;  Location: [facility];  Service: General;  Laterality: N/A;  . VIDEO ASSISTED THORACOSCOPY (VATS)/WEDGE RESECTION Right 11/15/2014   Procedure: VIDEO ASSISTED THORACOSCOPY (VATS)/LUNG RESECTION WITH RIGHT LINGULECTOMY;  Surgeon: [name], MD;  Location: [place];  Service: Thoracic;  Laterality: Right;  Marland Kitchen VIDEO BRONCHOSCOPY N/A 11/15/2014   Procedure: VIDEO BRONCHOSCOPY;  Surgeon: [name], MD;  Location: [facility];  Service: Thoracic;  Laterality: N/A;     SOCIAL HISTORY:  Social History   Socioeconomic History  . Marital status: Married    Spouse name: Not on file  . Number of children: 2  . Years of education: Not on file  . Highest education level: Not on file  Occupational History  . Occupation: self-employed Regulatory affairs officer, currently not working    Fish farm manager: SELF EMPLOYED  Tobacco Use  . Smoking status: Never Smoker  . Smokeless tobacco: Never Used  Substance and Sexual Activity  . Alcohol use: No  . Drug use: No  . Sexual activity: Yes    Birth control/protection: None    Comment: married  Other Topics Concern  . Not on file  Social History Narrative  . Not on file   Social Determinants of Health   Financial Resource Strain: Low Risk   . Difficulty of Paying Living Expenses: Not very hard  Food Insecurity: No Food Insecurity  . Worried About Charity fundraiser in the Last Year: Never true  . Ran Out of Food in the Last Year: Never true  Transportation Needs: No Transportation Needs  . Lack of Transportation (Medical): No  . Lack of Transportation (Non-Medical): No  Physical Activity: Inactive  . Days of Exercise per Week: 0 days  . Minutes of Exercise per Session: 0 min  Stress: No Stress Concern Present  . Feeling of Stress : Only a little  Social Connections: Unknown  . Frequency of Communication with Friends and Family: More than three times a week  .  Frequency of Social Gatherings with Friends and Family: Once a week  . Attends Religious Services: Patient refused  . Active Member of Clubs or Organizations: Patient refused  . Attends Archivist Meetings: Patient refused  . Marital Status: Patient refused  Intimate Partner Violence: Unknown  . Fear of Current or Ex-Partner: Patient refused  . Emotionally Abused: Patient refused  . Physically Abused: Patient refused  . Sexually Abused: Patient refused    FAMILY HISTORY:  Family History  Problem Relation Age of Onset  . Cancer Brother  throat  . Cancer Brother        prostate  . Colon cancer Neg Hx   . Liver disease Neg Hx   . Inflammatory bowel disease Neg Hx     CURRENT MEDICATIONS:  Outpatient Encounter Medications as of 08/01/2019  Medication Sig  . gabapentin (NEURONTIN) 300 MG capsule Take 3 capsules (900 mg total) by mouth 2 (two) times daily.  Marland Kitchen glimepiride (AMARYL) 2 MG tablet Take 2 mg by mouth 2 (two) times daily before a meal.   . magnesium oxide (MAG-OX) 400 (241.3 Mg) MG tablet Take 1 tablet (400 mg total) by mouth 3 (three) times daily.  . mirtazapine (REMERON) 15 MG tablet Take 7.5 mg by mouth at bedtime.  . pantoprazole (PROTONIX) 40 MG tablet Take 40 mg by mouth every evening.   . Probiotic Product (PROBIOTIC DAILY PO) Take 1 capsule by mouth 2 (two) times daily.   . rivaroxaban (XARELTO) 20 MG TABS tablet Take 1 tablet (20 mg total) by mouth daily with supper.  . vitamin B-12 (CYANOCOBALAMIN) 1000 MCG tablet Take 1,000 mcg by mouth daily.  . [DISCONTINUED] magnesium oxide (MAG-OX) 400 (241.3 Mg) MG tablet Take 1 tablet (400 mg total) by mouth 2 (two) times daily.  Marland Kitchen acetaminophen (TYLENOL) 500 MG tablet Take 1,000 mg by mouth every 6 (six) hours as needed for mild pain or moderate pain.   . clonazePAM (KLONOPIN) 1 MG tablet TAKE 1 TABLET BY MOUTH THREE TIMES DAILY AS NEEDED (Patient not taking: No sig reported)  . prochlorperazine (COMPAZINE) 10  MG tablet Take 1 tablet (10 mg total) by mouth every 6 (six) hours as needed for nausea or vomiting.  . [DISCONTINUED] oxyCODONE (ROXICODONE) 15 MG immediate release tablet Take 1 tablet (15 mg total) by mouth every 4 (four) hours as needed for pain.  . [DISCONTINUED] oxyCODONE-acetaminophen (PERCOCET) 10-325 MG tablet Take 1 tablet by mouth every 8 (eight) hours as needed for pain. (Patient not taking: Reported on 08/01/2019)   Facility-Administered Encounter Medications as of 08/01/2019  Medication  . sodium chloride 0.9 % injection 10 mL  . sodium chloride flush (NS) 0.9 % injection 10 mL    ALLERGIES:  Allergies  Allergen Reactions  . Oxycodone     Blisters, hallucinations (takes Percocet at home)  . Tramadol     Blisters, hallucinations  . Trazodone And Nefazodone Other (See Comments)    hallucinations     PHYSICAL EXAM:  ECOG Performance status: 1  Vitals:   08/01/19 0754  BP: 124/68  Pulse: 100  Resp: 18  Temp: (!) 97.1 F (36.2 C)  SpO2: 100%   Filed Weights   08/01/19 0754  Weight: 159 lb 3.2 oz (72.2 kg)    Physical Exam Constitutional:      Appearance: Normal appearance.  HENT:     Head: Normocephalic.     Nose: Nose normal.     Mouth/Throat:     Mouth: Mucous membranes are moist.     Pharynx: Oropharynx is clear.  Eyes:     Extraocular Movements: Extraocular movements intact.     Conjunctiva/sclera: Conjunctivae normal.  Cardiovascular:     Rate and Rhythm: Regular rhythm. Tachycardia present.     Pulses: Normal pulses.     Heart sounds: Normal heart sounds.  Pulmonary:     Effort: Pulmonary effort is normal.     Breath sounds: Normal breath sounds.  Abdominal:     General: Abdomen is flat. Bowel sounds are normal.  Palpations: Abdomen is soft.  Musculoskeletal:        General: Normal range of motion.     Cervical back: Normal range of motion.  Skin:    General: Skin is warm and dry.  Neurological:     General: No focal deficit present.       Mental Status: He is alert and oriented to person, place, and time.  Psychiatric:        Mood and Affect: Mood normal.        Behavior: Behavior normal.        Thought Content: Thought content normal.        Judgment: Judgment normal.      LABORATORY DATA:  I have reviewed the labs as listed.  CBC    Component Value Date/Time   WBC 9.8 08/01/2019 0806   RBC 3.74 (L) 08/01/2019 0806   HGB 10.4 (L) 08/01/2019 0806   HGB 11.6 (L) 02/04/2011 1059   HCT 34.5 (L) 08/01/2019 0806   HCT 34.7 (L) 02/04/2011 1059   PLT 380 08/01/2019 0806   PLT 282 02/04/2011 1059   MCV 92.2 08/01/2019 0806   MCV 76.0 (L) 02/04/2011 1059   MCH 27.8 08/01/2019 0806   MCHC 30.1 08/01/2019 0806   RDW 16.0 (H) 08/01/2019 0806   RDW 18.5 (H) 02/04/2011 1059   LYMPHSABS 1.2 08/01/2019 0806   LYMPHSABS 1.1 02/04/2011 1059   MONOABS 0.6 08/01/2019 0806   MONOABS 0.5 02/04/2011 1059   EOSABS 1.0 (H) 08/01/2019 0806   EOSABS 0.1 02/04/2011 1059   BASOSABS 0.1 08/01/2019 0806   BASOSABS 0.1 02/04/2011 1059   CMP Latest Ref Rng & Units 08/01/2019 07/27/2019 07/21/2019  Glucose 70 - 99 mg/dL 281(H) 193(H) 118(H)  BUN 8 - 23 mg/dL 12 6(L) 8  Creatinine 0.61 - 1.24 mg/dL 0.70 0.66 0.47(L)  Sodium 135 - 145 mmol/L 132(L) 136 137  Potassium 3.5 - 5.1 mmol/L 4.1 4.1 3.1(L)  Chloride 98 - 111 mmol/L 99 103 105  CO2 22 - 32 mmol/L '25 25 25  '$ Calcium 8.9 - 10.3 mg/dL 8.3(L) 8.6(L) 7.8(L)  Total Protein 6.5 - 8.1 g/dL 5.9(L) 5.8(L) 4.6(L)  Total Bilirubin 0.3 - 1.2 mg/dL 1.4(H) 1.6(H) 3.2(H)  Alkaline Phos 38 - 126 U/L 595(H) 606(H) 385(H)  AST 15 - 41 U/L 33 62(H) 79(H)  ALT 0 - 44 U/L 37 73(H) 104(H)    I have independently reviewed the scans.   ASSESSMENT & PLAN:   Rectal cancer ([place]) 1.  Stage IV rectal cancer with lung and pancreatic metastasis: -K-ras mutation positive, NRAS wild-type, MSI stable. -4 cycles of FOLFIRI dose reduced from 05/11/2019 through 06/16/2019. -He was admitted to the hospital  twice.  He had ERCP and stent changing on 07/18/2019. -CTAP on 07/16/2019 reviewed by me and compared to 04/19/2019. -Right lobe of the liver 2.1 cm lesion previously 7 mm.  New small hypodense lesions in segment 7.  Pancreatic mass has slightly improved. -We reviewed labs today.  Total bilirubin improved to 1.4.  White count and platelet count is normal.  Other LFTs are normal. -He will proceed with his first cycle of FOLFOX today.  We discussed about side effects in detail including cold sensitivity.  I will start back at a dose reduction of 20%.  We plan to add Avastin based on tolerability. -We will plan to see him back in 2 weeks for follow-up.  2.  Right lateral chest wall pain: -Most recent CT scan on 07/16/2019 showed right  posterior rib lesion has increased. -He is taking oxycodone 10 mg every 4 hours.  It is only helping pain for 2 to 3 hours.  He denies any drowsiness or constipation. -I will increase his oxycodone to 15 mg every 4 hours as needed.  3.  Anxiety: -He will continue Klonopin 1 mg twice daily as it is helping.  4.  Neuropathy: -He has numbness in the feet from prior oxaliplatin. -He does not have any neuropathic pains.  He is taking gabapentin 900 mg twice daily.  5.  Weight loss: -He lost 4 pounds since last week as he cannot eat because of his pain.  I have increased his pain medication.  6.  Hypomagnesemia: -His magnesium is low today.  He will receive magnesium 2 g IV.  I will increase magnesium to 3 times a day.  7.  DVT and PE: -He will continue Xarelto.  He does not have any bleeding.    Orders placed this encounter:  No orders of the defined types were placed in this encounter.     Derek Jack, MD  [facility] [phone]

## 2019-08-01 NOTE — Progress Notes (Signed)
Patient has been assessed, vital signs and labs have been reviewed by Dr. [name]. ANC, Creatinine, LFTs, and Platelets are within treatment parameters per Dr. [name].  Magnesium low today.  Please give 2 grams IV today per Dr. [name]. The patient is good to proceed with treatment at this time.

## 2019-08-01 NOTE — Patient Instructions (Signed)
[facility] Discharge Instructions for Patients Receiving Chemotherapy  Today you received the following chemotherapy agents   To help prevent nausea and vomiting after your treatment, we encourage you to take your nausea medication   If you develop nausea and vomiting that is not controlled by your nausea medication, call the clinic.   BELOW ARE SYMPTOMS THAT SHOULD BE REPORTED IMMEDIATELY:  *FEVER GREATER THAN 100.5 F  *CHILLS WITH OR WITHOUT FEVER  NAUSEA AND VOMITING THAT IS NOT CONTROLLED WITH YOUR NAUSEA MEDICATION  *UNUSUAL SHORTNESS OF BREATH  *UNUSUAL BRUISING OR BLEEDING  TENDERNESS IN MOUTH AND THROAT WITH OR WITHOUT PRESENCE OF ULCERS  *URINARY PROBLEMS  *BOWEL PROBLEMS  UNUSUAL RASH Items with * indicate a potential emergency and should be followed up as soon as possible.  Feel free to call the clinic should you have any questions or concerns. The clinic phone number is [phone].  Please show the CHEMO ALERT CARD at check-in to the Emergency Department and triage nurse.   

## 2019-08-01 NOTE — Progress Notes (Signed)
Labs reviewed today with Dr. [name]. Magnesium is low, will given additional Magnesium per orders. Will proceed with treatment as well. Will hold Avastin today per MD.   New consent obtained today.   Continuous 5FU pump connected today per protocol. Treatment given per orders. Patient tolerated it well without problems. Vitals stable and discharged home from clinic ambulatory. Follow up as scheduled.

## 2019-08-01 NOTE — Assessment & Plan Note (Signed)
1.  Stage IV rectal cancer with lung and pancreatic metastasis: -K-ras mutation positive, NRAS wild-type, MSI stable. -4 cycles of FOLFIRI dose reduced from 05/11/2019 through 06/16/2019. -He was admitted to the hospital twice.  He had ERCP and stent changing on 07/18/2019. -CTAP on 07/16/2019 reviewed by me and compared to 04/19/2019. -Right lobe of the liver 2.1 cm lesion previously 7 mm.  New small hypodense lesions in segment 7.  Pancreatic mass has slightly improved. -We reviewed labs today.  Total bilirubin improved to 1.4.  White count and platelet count is normal.  Other LFTs are normal. -He will proceed with his first cycle of FOLFOX today.  We discussed about side effects in detail including cold sensitivity.  I will start back at a dose reduction of 20%.  We plan to add Avastin based on tolerability. -We will plan to see him back in 2 weeks for follow-up.  2.  Right lateral chest wall pain: -Most recent CT scan on 07/16/2019 showed right posterior rib lesion has increased. -He is taking oxycodone 10 mg every 4 hours.  It is only helping pain for 2 to 3 hours.  He denies any drowsiness or constipation. -I will increase his oxycodone to 15 mg every 4 hours as needed.  3.  Anxiety: -He will continue Klonopin 1 mg twice daily as it is helping.  4.  Neuropathy: -He has numbness in the feet from prior oxaliplatin. -He does not have any neuropathic pains.  He is taking gabapentin 900 mg twice daily.  5.  Weight loss: -He lost 4 pounds since last week as he cannot eat because of his pain.  I have increased his pain medication.  6.  Hypomagnesemia: -His magnesium is low today.  He will receive magnesium 2 g IV.  I will increase magnesium to 3 times a day.  7.  DVT and PE: -He will continue Xarelto.  He does not have any bleeding.

## 2019-08-01 NOTE — Patient Instructions (Addendum)
[place] at [facility] Discharge Instructions  You were seen today by Dr. [name]. He went over your recent lab results. He is changing your pain medication to Oxycodone.  Increase Magnesium to three times/day.  He will see you back in two weeks for labs, treatment and follow up.   Thank you for choosing [place] at [facility] to provide your oncology and hematology care.  To afford each patient quality time with our provider, please arrive at least 15 minutes before your scheduled appointment time.   If you have a lab appointment with the [place] please come in thru the  Main Entrance and check in at the main information desk  You need to re-schedule your appointment should you arrive 10 or more minutes late.  We strive to give you quality time with our providers, and arriving late affects you and other patients whose appointments are after yours.  Also, if you no show three or more times for appointments you may be dismissed from the clinic at the providers discretion.     Again, thank you for choosing [facility].  Our hope is that these requests will decrease the amount of time that you wait before being seen by our physicians.       _____________________________________________________________  Should you have questions after your visit to [facility], please contact our office at [phone] between the hours of 8:00 a.m. and 4:30 p.m.  Voicemails left after 4:00 p.m. will not be returned until the following business day.  For prescription refill requests, have your pharmacy contact our office and allow 72 hours.    Cancer Center Support Programs:   > Cancer Support Group  2nd Tuesday of the month 1pm-2pm, Journey Room

## 2019-08-02 LAB — CEA: CEA: 46.6 ng/mL — ABNORMAL HIGH (ref 0.0–4.7)

## 2019-08-03 ENCOUNTER — Inpatient Hospital Stay (HOSPITAL_COMMUNITY): Payer: Medicare Other

## 2019-08-03 ENCOUNTER — Encounter (HOSPITAL_COMMUNITY): Payer: Self-pay

## 2019-08-03 ENCOUNTER — Other Ambulatory Visit: Payer: Self-pay

## 2019-08-03 VITALS — BP 121/71 | HR 112 | Temp 96.9°F | Resp 18

## 2019-08-03 DIAGNOSIS — C7889 Secondary malignant neoplasm of other digestive organs: Secondary | ICD-10-CM | POA: Diagnosis not present

## 2019-08-03 DIAGNOSIS — C7800 Secondary malignant neoplasm of unspecified lung: Secondary | ICD-10-CM | POA: Diagnosis not present

## 2019-08-03 DIAGNOSIS — C20 Malignant neoplasm of rectum: Secondary | ICD-10-CM | POA: Diagnosis not present

## 2019-08-03 DIAGNOSIS — Z5111 Encounter for antineoplastic chemotherapy: Secondary | ICD-10-CM | POA: Diagnosis not present

## 2019-08-03 DIAGNOSIS — Z79899 Other long term (current) drug therapy: Secondary | ICD-10-CM | POA: Diagnosis not present

## 2019-08-03 MED ORDER — SODIUM CHLORIDE 0.9% FLUSH
10.0000 mL | INTRAVENOUS | Status: DC | PRN
  Administered 2019-08-03: 10 mL

## 2019-08-03 MED ORDER — HEPARIN SOD (PORK) LOCK FLUSH 100 UNIT/ML IV SOLN
500.0000 [IU] | Freq: Once | INTRAVENOUS | Status: AC | PRN
  Administered 2019-08-03: 500 [IU]

## 2019-08-03 MED ORDER — PEGFILGRASTIM-CBQV 6 MG/0.6ML ~~LOC~~ SOSY
6.0000 mg | PREFILLED_SYRINGE | Freq: Once | SUBCUTANEOUS | Status: AC
  Administered 2019-08-03: 6 mg via SUBCUTANEOUS
  Filled 2019-08-03: qty 0.6

## 2019-08-03 NOTE — Progress Notes (Signed)
Patient for chemotherapy pump disconnect.  Patients port flushed without difficulty.  Good blood return noted with no bruising or swelling noted at site.  Band aid applied.  VSS with discharge and left ambulatory with no s/s of distress noted.  

## 2019-08-08 ENCOUNTER — Inpatient Hospital Stay (HOSPITAL_COMMUNITY): Payer: Medicare Other | Attending: Hematology

## 2019-08-08 ENCOUNTER — Encounter (HOSPITAL_COMMUNITY): Payer: Self-pay | Admitting: *Deleted

## 2019-08-08 VITALS — BP 112/69 | HR 118 | Temp 97.1°F | Resp 18

## 2019-08-08 DIAGNOSIS — C20 Malignant neoplasm of rectum: Secondary | ICD-10-CM | POA: Diagnosis not present

## 2019-08-08 DIAGNOSIS — Z79899 Other long term (current) drug therapy: Secondary | ICD-10-CM | POA: Diagnosis not present

## 2019-08-08 DIAGNOSIS — Z5189 Encounter for other specified aftercare: Secondary | ICD-10-CM | POA: Diagnosis not present

## 2019-08-08 DIAGNOSIS — C7889 Secondary malignant neoplasm of other digestive organs: Secondary | ICD-10-CM | POA: Insufficient documentation

## 2019-08-08 DIAGNOSIS — Z5111 Encounter for antineoplastic chemotherapy: Secondary | ICD-10-CM | POA: Insufficient documentation

## 2019-08-08 DIAGNOSIS — C7800 Secondary malignant neoplasm of unspecified lung: Secondary | ICD-10-CM | POA: Diagnosis not present

## 2019-08-08 DIAGNOSIS — Z7901 Long term (current) use of anticoagulants: Secondary | ICD-10-CM | POA: Diagnosis not present

## 2019-08-08 DIAGNOSIS — E860 Dehydration: Secondary | ICD-10-CM

## 2019-08-08 DIAGNOSIS — R112 Nausea with vomiting, unspecified: Secondary | ICD-10-CM

## 2019-08-08 LAB — COMPREHENSIVE METABOLIC PANEL
ALT: 43 U/L (ref 0–44)
AST: 42 U/L — ABNORMAL HIGH (ref 15–41)
Albumin: 2.8 g/dL — ABNORMAL LOW (ref 3.5–5.0)
Alkaline Phosphatase: 534 U/L — ABNORMAL HIGH (ref 38–126)
Anion gap: 10 (ref 5–15)
BUN: 10 mg/dL (ref 8–23)
CO2: 27 mmol/L (ref 22–32)
Calcium: 8.6 mg/dL — ABNORMAL LOW (ref 8.9–10.3)
Chloride: 98 mmol/L (ref 98–111)
Creatinine, Ser: 0.74 mg/dL (ref 0.61–1.24)
GFR calc Af Amer: >60 mL/min (ref >60)
GFR calc non Af Amer: >60 mL/min (ref >60)
Glucose, Bld: 209 mg/dL — ABNORMAL HIGH (ref 70–99)
Potassium: 3.2 mmol/L — ABNORMAL LOW (ref 3.5–5.1)
Sodium: 135 mmol/L (ref 135–145)
Total Bilirubin: 1.9 mg/dL — ABNORMAL HIGH (ref 0.3–1.2)
Total Protein: 6.1 g/dL — ABNORMAL LOW (ref 6.5–8.1)

## 2019-08-08 LAB — CBC WITH DIFFERENTIAL/PLATELET
Abs Immature Granulocytes: 0.24 10*3/uL — ABNORMAL HIGH (ref 0.00–0.07)
Basophils Absolute: 0.1 10*3/uL (ref 0.0–0.1)
Basophils Relative: 0 %
Eosinophils Absolute: 0.1 10*3/uL (ref 0.0–0.5)
Eosinophils Relative: 1 %
HCT: 36.7 % — ABNORMAL LOW (ref 39.0–52.0)
Hemoglobin: 11.4 g/dL — ABNORMAL LOW (ref 13.0–17.0)
Immature Granulocytes: 1 %
Lymphocytes Relative: 9 %
Lymphs Abs: 1.7 10*3/uL (ref 0.7–4.0)
MCH: 27.7 pg (ref 26.0–34.0)
MCHC: 31.1 g/dL (ref 30.0–36.0)
MCV: 89.3 fL (ref 80.0–100.0)
Monocytes Absolute: 1.8 10*3/uL — ABNORMAL HIGH (ref 0.1–1.0)
Monocytes Relative: 10 %
Neutro Abs: 14.2 10*3/uL — ABNORMAL HIGH (ref 1.7–7.7)
Neutrophils Relative %: 79 %
Platelets: 216 10*3/uL (ref 150–400)
RBC: 4.11 MIL/uL — ABNORMAL LOW (ref 4.22–5.81)
RDW: 15.2 % (ref 11.5–15.5)
WBC: 18 10*3/uL — ABNORMAL HIGH (ref 4.0–10.5)
nRBC: 0 % (ref 0.0–0.2)

## 2019-08-08 LAB — MAGNESIUM: Magnesium: 1.6 mg/dL — ABNORMAL LOW (ref 1.7–2.4)

## 2019-08-08 MED ORDER — HEPARIN SOD (PORK) LOCK FLUSH 100 UNIT/ML IV SOLN
500.0000 [IU] | Freq: Once | INTRAVENOUS | Status: AC
  Administered 2019-08-08: 500 [IU] via INTRAVENOUS

## 2019-08-08 MED ORDER — SODIUM CHLORIDE 0.9% FLUSH
10.0000 mL | Freq: Once | INTRAVENOUS | Status: AC
  Administered 2019-08-08: 10 mL via INTRAVENOUS

## 2019-08-08 MED ORDER — SODIUM CHLORIDE 0.9 % IV SOLN
Freq: Once | INTRAVENOUS | Status: AC
  Filled 2019-08-08: qty 1000

## 2019-08-08 NOTE — Progress Notes (Signed)
Patient's wife called stating that for the past 2-3 days, patient has been dizzy and extremely weak. He has no energy.  She denies fever, vomiting, diarrhea.  Patient has had poor oral intake.  Patient made an appt for today for fluids.  Wife agrees and is on the way with patient now.  Treatment room nurses updated.

## 2019-08-08 NOTE — Progress Notes (Signed)
Patient tolerated hydration with no complaints voiced.  Labs reviewed with oncologist notified.  No orders received.  Port site clean and dry with good blood return noted before and after hydration.  No bruising or swelling noted with port.  Band aid applied.  VSS with discharge and left by wheelchair with no s/s of distress noted.

## 2019-08-09 NOTE — Progress Notes (Signed)

## 2019-08-15 ENCOUNTER — Encounter (HOSPITAL_COMMUNITY): Payer: Self-pay | Admitting: Hematology

## 2019-08-15 ENCOUNTER — Other Ambulatory Visit: Payer: Self-pay

## 2019-08-15 ENCOUNTER — Inpatient Hospital Stay (HOSPITAL_COMMUNITY): Payer: Medicare Other

## 2019-08-15 ENCOUNTER — Inpatient Hospital Stay (HOSPITAL_BASED_OUTPATIENT_CLINIC_OR_DEPARTMENT_OTHER): Payer: Medicare Other | Admitting: Hematology

## 2019-08-15 ENCOUNTER — Encounter (HOSPITAL_COMMUNITY): Payer: Self-pay

## 2019-08-15 VITALS — BP 99/53 | HR 64 | Temp 97.9°F | Resp 18 | Wt 155.0 lb

## 2019-08-15 DIAGNOSIS — Z5189 Encounter for other specified aftercare: Secondary | ICD-10-CM | POA: Diagnosis not present

## 2019-08-15 DIAGNOSIS — C20 Malignant neoplasm of rectum: Secondary | ICD-10-CM | POA: Diagnosis not present

## 2019-08-15 DIAGNOSIS — C7800 Secondary malignant neoplasm of unspecified lung: Secondary | ICD-10-CM | POA: Diagnosis not present

## 2019-08-15 DIAGNOSIS — C7889 Secondary malignant neoplasm of other digestive organs: Secondary | ICD-10-CM | POA: Diagnosis not present

## 2019-08-15 DIAGNOSIS — R112 Nausea with vomiting, unspecified: Secondary | ICD-10-CM

## 2019-08-15 DIAGNOSIS — R197 Diarrhea, unspecified: Secondary | ICD-10-CM

## 2019-08-15 DIAGNOSIS — E860 Dehydration: Secondary | ICD-10-CM

## 2019-08-15 DIAGNOSIS — Z5111 Encounter for antineoplastic chemotherapy: Secondary | ICD-10-CM | POA: Diagnosis not present

## 2019-08-15 LAB — COMPREHENSIVE METABOLIC PANEL
ALT: 26 U/L (ref 0–44)
AST: 26 U/L (ref 15–41)
Albumin: 2.8 g/dL — ABNORMAL LOW (ref 3.5–5.0)
Alkaline Phosphatase: 369 U/L — ABNORMAL HIGH (ref 38–126)
Anion gap: 8 (ref 5–15)
BUN: 7 mg/dL — ABNORMAL LOW (ref 8–23)
CO2: 27 mmol/L (ref 22–32)
Calcium: 8.6 mg/dL — ABNORMAL LOW (ref 8.9–10.3)
Chloride: 102 mmol/L (ref 98–111)
Creatinine, Ser: 0.62 mg/dL (ref 0.61–1.24)
GFR calc Af Amer: >60 mL/min (ref >60)
GFR calc non Af Amer: >60 mL/min (ref >60)
Glucose, Bld: 174 mg/dL — ABNORMAL HIGH (ref 70–99)
Potassium: 3.9 mmol/L (ref 3.5–5.1)
Sodium: 137 mmol/L (ref 135–145)
Total Bilirubin: 0.9 mg/dL (ref 0.3–1.2)
Total Protein: 6.2 g/dL — ABNORMAL LOW (ref 6.5–8.1)

## 2019-08-15 LAB — CBC WITH DIFFERENTIAL/PLATELET
Abs Immature Granulocytes: 0.03 10*3/uL (ref 0.00–0.07)
Basophils Absolute: 0 10*3/uL (ref 0.0–0.1)
Basophils Relative: 0 %
Eosinophils Absolute: 0.5 10*3/uL (ref 0.0–0.5)
Eosinophils Relative: 6 %
HCT: 36.6 % — ABNORMAL LOW (ref 39.0–52.0)
Hemoglobin: 11.1 g/dL — ABNORMAL LOW (ref 13.0–17.0)
Immature Granulocytes: 0 %
Lymphocytes Relative: 15 %
Lymphs Abs: 1.1 10*3/uL (ref 0.7–4.0)
MCH: 27.6 pg (ref 26.0–34.0)
MCHC: 30.3 g/dL (ref 30.0–36.0)
MCV: 91 fL (ref 80.0–100.0)
Monocytes Absolute: 0.5 10*3/uL (ref 0.1–1.0)
Monocytes Relative: 6 %
Neutro Abs: 5.4 10*3/uL (ref 1.7–7.7)
Neutrophils Relative %: 73 %
Platelets: 334 10*3/uL (ref 150–400)
RBC: 4.02 MIL/uL — ABNORMAL LOW (ref 4.22–5.81)
RDW: 14.5 % (ref 11.5–15.5)
WBC: 7.5 10*3/uL (ref 4.0–10.5)
nRBC: 0 % (ref 0.0–0.2)

## 2019-08-15 LAB — MAGNESIUM: Magnesium: 1.7 mg/dL (ref 1.7–2.4)

## 2019-08-15 MED ORDER — SODIUM CHLORIDE 0.9 % IV SOLN
Freq: Once | INTRAVENOUS | Status: AC
  Filled 2019-08-15: qty 1000

## 2019-08-15 MED ORDER — HEPARIN SOD (PORK) LOCK FLUSH 100 UNIT/ML IV SOLN
500.0000 [IU] | Freq: Once | INTRAVENOUS | Status: AC | PRN
  Administered 2019-08-15: 11:00:00 500 [IU]

## 2019-08-15 MED ORDER — MAGIC MOUTHWASH W/LIDOCAINE: 15.0000 mL | Freq: Two times a day (BID) | ORAL | 2 refills | Status: DC

## 2019-08-15 MED ORDER — ALTEPLASE 2 MG IJ SOLR: 2.0000 mg | Freq: Once | INTRAMUSCULAR | Status: DC | PRN

## 2019-08-15 MED ORDER — SODIUM CHLORIDE 0.9% FLUSH
10.0000 mL | Freq: Once | INTRAVENOUS | Status: AC | PRN
  Administered 2019-08-15: 10 mL

## 2019-08-15 NOTE — Progress Notes (Signed)
Patient presents today for treatment and follow up visit with Dr. [name]. Vital signs stable. Pulse 102 today. Labs pending. Patient denies pain today. Patient states since the last treatment he has not done well. Patient unable to eat or drink due to mucous in his throat that chokes and gags him per patient's words. Patient's wife at the bedside states his colostomy bag has been full with watery stool. Patient complains of dizziness upon standing.    Message received from [facility] LPN/ Dr. [name]. HOLD treatment today due to patient not feeling well per note. Infuse 1 Liter of Normal Saline today with 20 mEq of Potassium Chloride and 2 Grams of Magnesium Sulfate. Per [facility] LPN patient to return next week for treatment.   Hydration given today per MD orders. Tolerated infusion without adverse affects. Vital signs stable. No complaints at this time. Discharged from clinic via wheel chair. F/U with [facility] as scheduled.

## 2019-08-15 NOTE — Progress Notes (Signed)
Patient has been assessed, vital signs and labs have been reviewed by Dr. [name]. HOLD Treatment today due to patient not feeling well. Please give 1 liter of fluids with electrolytes over 2 hours today per Dr [name].

## 2019-08-15 NOTE — Assessment & Plan Note (Addendum)
1.  Stage IV rectal cancer with lung and pancreatic metastasis: -K-ras mutation positive, NRAS wild-type, MSI stable -Recent progression on FOLFIRI. -CTAP on 07/16/2019 showed increase in size of the right lobe of the liver mass measuring 2.1 cm, previously 7 mm.  New small hypodense lesions in segment 7.  Pancreatic mass has slightly improved. -FOLFOX with 20% dose reduction on 08/01/2019. -He has felt very better after chemotherapy.  He lost 4 pounds.  His eating has gone down.  He feels very weak today. -I have reviewed his labs.  They are adequate to proceed with next cycle.  CEA was 46.6 on 08/01/2019. -Because of his weakness, I would hold off on his treatment today. -Since his blood pressure is 94/56 and he is having dizziness, we will give him 1 L of saline with electrolytes over 2 hours. -If he improves well, I will consider adding Avastin during cycle 2.  He will be seen back next week.  2.  Right lateral chest wall pain: -CT on 07/16/2019 shows right posterior rib lesion increased.  He is taking oxycodone 15 mg every 4 hours which is helping.  3.  Anxiety: -He will continue Klonopin 1 mg twice daily.  4.  Neuropathy: -He has numbness in the feet from prior oxaliplatin.  I have cut back on the dose of oxaliplatin.  It is not any worse.  He is taking gabapentin 900 mg twice daily.  5.  Hypomagnesemia: -Magnesium today is 1.7.  He will continue magnesium twice daily.  6.  DVT and PE: -We will continue Xarelto.  He does not report any bleeding.  7.  Weight loss: -He lost about 4 pounds since cycle 1 of FOLFOX 2 weeks ago.  I will hold off on his treatment today. -He has decreased eating.  He is drinking boost 2 cans/day.

## 2019-08-15 NOTE — Progress Notes (Signed)
Charles [name], Charles [name] [id number]   CLINIC:  Medical Oncology/Hematology  PCP:  [name], MD [place] [address] [phone]   REASON FOR VISIT:  Follow-up for Stage IV Colon Cancer  CURRENT THERAPY: FOLFOX  BRIEF ONCOLOGIC HISTORY:  Oncology History  Rectal cancer ([place])  07/24/2010 Initial Diagnosis   Invasive adenocarcinoma of rectum   09/09/2010 Concurrent Chemotherapy   S/P radiation and concurrent 5-FU continuous infusion from 09/09/10- 10/10/10.   11/14/2010 Surgery   S/P proctectomy with colorectal anastomosis and diverting loop ileostomy on 11/14/10 at [facility] by Dr. Harlon Ditty. Pathology reveals a pT3b N1 with 3/20 lymph nodes.   02/05/2011 - 07/14/2011 Chemotherapy   FOLFOX   08/18/2011 Surgery   Approximate date of surgery- [place] by Dr. Harlon Ditty    Remission     11/24/2013 Survivorship   Colonoscopy- somewhat fibrotic/friable anastomotic mucosal-status post biopsy (narrowing not felt to be clinically significant).  Negative pathology for malignancy   09/12/2014 Imaging   DVT in the left femoral venous system, left common iliac vein, IVC, and within the IVC filter Right lower extremity venography confirms chronic occlusion of the femoral venous system with collateralization. The right iliac venous system is patent and do   09/25/2014 PET scan   The right middle lobe pulmonary nodule is hypermetabolic, favored to represent a primary bronchogenic carcinoma.Equivocal mediastinal nodes, similar to surrounding blood pool. Bilateral adrenal hypermetabolism, felt to be physiologic   10/24/2014 Pathology Results   Lung, needle/core biopsy(ies), RML - ADENOCARCINOMA, SEE COMMENT metastatic adenocarcinoma of a colorectal primary   10/24/2014 Relapse/Recurrence     11/16/2014 Definitive Surgery   Bronchoscopy, right video-assisted thoracoscopy, wedge resection of right middle lobe by Dr. Servando Snare   11/16/2014 Pathology  Results   Lung, wedge biopsy/resection, right lingula and small portion of middle lobe - METASTATIC ADENOCARCINOMA, CONSISTENT WITH COLORECTAL PRIMARY, SPANNING 2.0 CM. - THE SURGICAL RESECTION MARGINS ARE NEGATIVE FOR ADENOCARCINOMA.   11/16/2014 Remission   THE SURGICAL RESECTION MARGINS ARE NEGATIVE FOR ADENOCARCINOMA.   03/07/2015 Imaging   CT CAP- Interval resection of right middle lobe metastasis. No acute process or evidence of metastatic disease in the chest, abdomen or pelvis. Improved right upper lobe reticular nodular opacities are favored to represent resolving infection.   09/14/2015 Imaging   CT CAP- No findings of recurrent malignancy. No recurrence along the wedge resection site of the right middle lobe. Right anterior abdominal wall focal hernia containing a knuckle of small bowel without complicating feature.   06/13/2016 Imaging   CT CAP- 1. New right-sided pleural metastasis/mass. Other smaller right-sided pulmonary nodules which are all pleural-based and most likely represent pleural metastasis. 2. No convincing evidence of abdominopelvic nodal metastasis. 3. Constellation of findings, including pancreatic atrophy, duct dilatation, and pancreatic head soft tissue fullness which are highly suspicious for pancreatic adenocarcinoma. Metastatic disease felt much less likely. Consider endoscopic ultrasound sampling or ERCP. Cannot exclude superimposed acute pancreatitis. 4. New enlargement of the appendiceal tip with subtle surrounding edema. Cannot exclude early or mild appendicitis. 5. Coronary artery atherosclerosis. Aortic atherosclerosis. 6. 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   06/13/2016 Progression   CT scan demonstrates progression of disease   06/19/2016 Procedure   EUS with FNA by Dr. Ardis Hughs   06/20/2016 Pathology Results   FINE NEEDLE ASPIRATION, ENDOSCOPIC, PANCREAS UNCINATE AREA(SPECIMEN 1 OF 1 COLLECTED 06/19/16): MALIGNANT  CELLS CONSISTENT WITH METASTATIC ADENOCARCINOMA.  06/25/2016 PET scan   1. Two pleural-based nodules in the right hemithorax are hypermetabolic. Metastatic disease is a distinct consideration. The scattered pulmonary parenchymal nodule seen on previous diagnostic CT imaging are below the threshold for reliable resolution on PET imaging. 2. Hypermetabolic lesion pancreatic head, consistent with neoplasm. As noted on prior CT, adenocarcinoma is any consideration. No evidence for hypermetabolic abdominal lymphadenopathy. Mottled uptake noted in the liver, but no discrete hepatic metastases are evident on PET imaging. 3. Appendix remains distended up to 0.9-10 mm diameter with a stone towards the tip in subtle periappendiceal edema/inflammation. The appendix is hypermetabolic along its length. Imaging features are relatively stable in the 12 day interval since prior CT scan. While appendicitis is a consideration, the relative stability over 12 days would be unusual for that etiology. Continued close follow-up recommended.   06/26/2016 Pathology Results   Not enough tissue for foundationONE or K-ras testing.   07/07/2016 Procedure   Port placed by Dr. Arnoldo Morale   07/08/2016 -  Chemotherapy   The patient had pegfilgrastim (NEULASTA) injection 6 mg, 6 mg, Subcutaneous,  Once, 0 of 4 cycles  ondansetron (ZOFRAN) IVPB 8 mg, 8 mg, Intravenous,  Once, 0 of 4 cycles  leucovorin 804 mg in dextrose 5 % 250 mL infusion, 400 mg/m2, Intravenous,  Once, 0 of 4 cycles  oxaliplatin (ELOXATIN) 170 mg in dextrose 5 % 500 mL chemo infusion, 85 mg/m2, Intravenous,  Once, 0 of 4 cycles  fluorouracil (ADRUCIL) 4,800 mg in sodium chloride 0.9 % 150 mL chemo infusion, 2,400 mg/m2 = 4,800 mg, Intravenous, 1 Day/Dose, 0 of 4 cycles  fluorouracil (ADRUCIL) chemo injection 800 mg, 400 mg/m2, Intravenous,  Once, 0 of 4 cycles  pegfilgrastim (NEULASTA) injection 6 mg, 6 mg, Subcutaneous,  Once, 2 of 2  cycles  ondansetron (ZOFRAN) IVPB 8 mg, 8 mg, Intravenous,  Once, 12 of 12 cycles  leucovorin 660 mg in dextrose 5 % 250 mL infusion, 660 mg (original dose ), Intravenous,  Once, 12 of 12 cycles Dose modification: 660 mg (Cycle 1)  oxaliplatin (ELOXATIN) 140 mg in dextrose 5 % 500 mL chemo infusion, 140 mg (original dose ), Intravenous,  Once, 12 of 12 cycles Dose modification: 140 mg (Cycle 1), 119 mg (85 % of original dose 140 mg, Cycle 8, Reason: Provider Judgment)  fluorouracil (ADRUCIL) 3,850 mg in sodium chloride 0.9 % 150 mL chemo infusion, 3,850 mg (original dose ), Intravenous, 1 Day/Dose, 12 of 12 cycles Dose modification: 3,850 mg (Cycle 1)  fluorouracil (ADRUCIL) chemo injection 700 mg, 700 mg (original dose ), Intravenous,  Once, 12 of 12 cycles Dose modification: 700 mg (Cycle 1)  palonosetron (ALOXI) injection 0.25 mg, 0.25 mg, Intravenous,  Once, 7 of 12 cycles Administration: 0.25 mg (09/30/2016)  bevacizumab (AVASTIN) 475 mg in sodium chloride 0.9 % 100 mL chemo infusion, 5 mg/kg = 475 mg, Intravenous,  Once, 6 of 11 cycles Administration: 500 mg (09/30/2016)  leucovorin 880 mg in dextrose 5 % 250 mL infusion, 400 mg/m2 = 880 mg, Intravenous,  Once, 7 of 12 cycles Administration: 880 mg (09/30/2016)  oxaliplatin (ELOXATIN) 185 mg in dextrose 5 % 500 mL chemo infusion, 85 mg/m2 = 185 mg, Intravenous,  Once, 7 of 12 cycles Administration: 185 mg (09/30/2016)  fluorouracil (ADRUCIL) chemo injection 900 mg, 400 mg/m2 = 900 mg, Intravenous,  Once, 7 of 12 cycles Administration: 900 mg (09/30/2016)  fluorouracil (ADRUCIL) 5,300 mg in sodium chloride 0.9 % 144 mL chemo infusion, 2,400 mg/m2 = 5,300  mg, Intravenous, 1 Day/Dose, 7 of 12 cycles Administration: 5,300 mg (09/30/2016)  for chemotherapy treatment.     08/27/2016 Imaging   CT CAP- 1. Pulmonary metastatic disease is stable. 2. Pancreatic head mass, grossly stable, with progressive atrophy of the body and tail of the  pancreas. Stable peripancreatic lymph node. 3. Mild circumferential rectal wall thickening, stable. 4. Aortic atherosclerosis (ICD10-170.0). Coronary artery calcification. 5. Hyperattenuating lesion off the lower pole left kidney, stable, too small to characterize. Continued attention on followup exams is warranted. 6. Persistent wall thickening and mild dilatation of the proximal appendix, of uncertain etiology. Continued attention on followup exams is warranted.   11/06/2016 Imaging   CT C/A/P: IMPRESSION: 1. Stable pulmonary metastatic disease. 2. Similar appearing pancreatic head mass. Progressed atrophy of the pancreatic body and tail. Stable peripancreatic lymph node. 3. Stable hypoattenuating lesion partially exophytic off the inferior pole of the left kidney. 4. Aortic atherosclerosis. 5. Persistent mild wall thickening and mild dilatation of the appendix.    01/30/2017 Imaging   Ct C/A/P: IMPRESSION: 1. Stable pulmonary metastatic disease. 2. Stable to slight increase in size of pancreatic head mass. 3.  Aortic Atherosclerosis (ICD10-I70.0). 4. Similar appearance of mild wall thickening of the appendix which contains an appendicolith.   05/01/2017 Imaging   CT C/A/P: Stable disease in the lungs, no progression of disease in the abdomen.   10/04/2018 - 07/26/2019 Chemotherapy   The patient had palonosetron (ALOXI) injection 0.25 mg, 0.25 mg, Intravenous,  Once, 12 of 14 cycles Administration: 0.25 mg (10/04/2018), 0.25 mg (10/25/2018), 0.25 mg (12/01/2018), 0.25 mg (12/15/2018), 0.25 mg (12/29/2018), 0.25 mg (01/12/2019), 0.25 mg (01/26/2019), 0.25 mg (02/09/2019), 0.25 mg (05/11/2019), 0.25 mg (05/25/2019), 0.25 mg (06/08/2019), 0.25 mg (06/22/2019) pegfilgrastim (NEULASTA) injection 6 mg, 6 mg, Subcutaneous, Once, 2 of 2 cycles Administration: 6 mg (10/27/2018) pegfilgrastim-cbqv (UDENYCA) injection 6 mg, 6 mg, Subcutaneous, Once, 10 of 12 cycles Administration: 6 mg (12/03/2018), 6 mg  (12/17/2018), 6 mg (12/31/2018), 6 mg (01/14/2019), 6 mg (01/28/2019), 6 mg (02/11/2019), 6 mg (05/13/2019), 6 mg (05/27/2019), 6 mg (06/10/2019), 6 mg (06/24/2019) bevacizumab (AVASTIN) 450 mg in sodium chloride 0.9 % 100 mL chemo infusion, 5 mg/kg = 450 mg, Intravenous,  Once, 4 of 4 cycles Administration: 400 mg (12/01/2018), 400 mg (12/15/2018), 400 mg (12/29/2018) irinotecan (CAMPTOSAR) 380 mg in sodium chloride 0.9 % 500 mL chemo infusion, 180 mg/m2 = 380 mg, Intravenous,  Once, 12 of 14 cycles Dose modification: 144 mg/m2 (original dose 180 mg/m2, Cycle 2, Reason: Provider Judgment, Comment: 20% dose reduction), 90 mg/m2 (50 % of original dose 180 mg/m2, Cycle 3, Reason: Other (see comments), Comment: [id number]) Administration: 380 mg (10/04/2018), 300 mg (10/25/2018), 200 mg (12/01/2018), 200 mg (12/15/2018), 200 mg (12/29/2018), 200 mg (01/12/2019), 200 mg (01/26/2019), 200 mg (02/09/2019), 180 mg (05/11/2019), 180 mg (05/25/2019), 180 mg (06/08/2019), 180 mg (06/22/2019) leucovorin 900 mg in sodium chloride 0.9 % 250 mL infusion, 421 mg/m2 = 856 mg, Intravenous,  Once, 12 of 14 cycles Dose modification: 320 mg/m2 (original dose 400 mg/m2, Cycle 2, Reason: Provider Judgment, Comment: 20% dose reduction) Administration: 900 mg (10/04/2018), 700 mg (10/25/2018), 700 mg (12/01/2018), 700 mg (12/15/2018), 700 mg (12/29/2018), 700 mg (01/12/2019), 700 mg (01/26/2019), 700 mg (02/09/2019), 700 mg (05/11/2019), 700 mg (05/25/2019), 700 mg (06/08/2019), 700 mg (06/22/2019) fluorouracil (ADRUCIL) chemo injection 850 mg, 400 mg/m2 = 850 mg, Intravenous,  Once, 12 of 14 cycles Dose modification: 320 mg/m2 (original dose 400 mg/m2, Cycle 2, Reason: Provider Judgment, Comment: 20%  dose reduction) Administration: 850 mg (10/04/2018), 700 mg (10/25/2018), 700 mg (12/01/2018), 700 mg (12/15/2018), 700 mg (12/29/2018), 700 mg (01/12/2019), 700 mg (01/26/2019), 700 mg (02/09/2019), 700 mg (05/11/2019), 700 mg (05/25/2019), 700 mg (06/08/2019), 700 mg (06/22/2019) fosaprepitant  (EMEND) 150 mg, dexamethasone (DECADRON) 12 mg in sodium chloride 0.9 % 145 mL IVPB, , Intravenous,  Once, 10 of 12 cycles Administration:  (12/01/2018),  (12/15/2018),  (12/29/2018),  (01/12/2019),  (01/26/2019),  (02/09/2019),  (05/11/2019),  (05/25/2019),  (06/08/2019),  (06/22/2019) fluorouracil (ADRUCIL) 5,000 mg in sodium chloride 0.9 % 150 mL chemo infusion, 2,325 mg/m2 = 5,150 mg, Intravenous, 1 Day/Dose, 12 of 14 cycles Dose modification: 1,920 mg/m2 (original dose 2,400 mg/m2, Cycle 2, Reason: Provider Judgment, Comment: 20% dose reduction) Administration: 5,000 mg (10/04/2018), 4,100 mg (10/25/2018), 4,100 mg (12/01/2018), 4,100 mg (12/15/2018), 4,100 mg (12/29/2018), 4,100 mg (01/12/2019), 4,100 mg (01/26/2019), 4,100 mg (02/09/2019), 4,100 mg (05/11/2019), 4,100 mg (05/25/2019), 4,100 mg (06/08/2019), 4,100 mg (06/22/2019) bevacizumab-bvzr (ZIRABEV) 400 mg in sodium chloride 0.9 % 100 mL chemo infusion, 5 mg/kg = 400 mg (100 % of original dose 5 mg/kg), Intravenous,  Once, 4 of 6 cycles Dose modification: 5 mg/kg (original dose 5 mg/kg, Cycle 6) Administration: 400 mg (01/12/2019), 400 mg (01/26/2019), 400 mg (02/09/2019)  for chemotherapy treatment.    08/01/2019 -  Chemotherapy   The patient had palonosetron (ALOXI) injection 0.25 mg, 0.25 mg, Intravenous,  Once, 1 of 4 cycles Administration: 0.25 mg (08/01/2019) pegfilgrastim-cbqv (UDENYCA) injection 6 mg, 6 mg, Subcutaneous, Once, 1 of 4 cycles Administration: 6 mg (08/03/2019) leucovorin 628 mg in dextrose 5 % 250 mL infusion, 320 mg/m2 = 628 mg (80 % of original dose 400 mg/m2), Intravenous,  Once, 1 of 4 cycles Dose modification: 320 mg/m2 (80 % of original dose 400 mg/m2, Cycle 1, Reason: Change in LFTs) Administration: 628 mg (08/01/2019) oxaliplatin (ELOXATIN) 135 mg in dextrose 5 % 500 mL chemo infusion, 68 mg/m2 = 135 mg (80 % of original dose 85 mg/m2), Intravenous,  Once, 1 of 4 cycles Dose modification: 68 mg/m2 (80 % of original dose 85 mg/m2, Cycle 1,  Reason: Change in LFTs) Administration: 135 mg (08/01/2019) fluorouracil (ADRUCIL) chemo injection 650 mg, 320 mg/m2 = 650 mg (80 % of original dose 400 mg/m2), Intravenous,  Once, 1 of 4 cycles Dose modification: 320 mg/m2 (80 % of original dose 400 mg/m2, Cycle 1, Reason: Change in LFTs) Administration: 650 mg (08/01/2019) fluorouracil (ADRUCIL) 3,750 mg in sodium chloride 0.9 % 75 mL chemo infusion, 1,920 mg/m2 = 3,750 mg (80 % of original dose 2,400 mg/m2), Intravenous, 1 Day/Dose, 1 of 4 cycles Dose modification: 1,920 mg/m2 (80 % of original dose 2,400 mg/m2, Cycle 1, Reason: Change in LFTs) Administration: 3,750 mg (08/01/2019) bevacizumab-bvzr (ZIRABEV) 400 mg in sodium chloride 0.9 % 100 mL chemo infusion, 5 mg/kg = 400 mg, Intravenous,  Once, 0 of 3 cycles  for chemotherapy treatment.       CANCER STAGING: Cancer Staging Rectal cancer ([place]) Staging form: Colon and Rectum, AJCC 7th Edition - Clinical: Stage IIIB (T3, N1, M0) - Signed by [name], MD on 02/04/2011 - Pathologic stage from 11/17/2014: Stage IVA (M1a) - Signed by Baird Cancer, PA-C on 09/14/2015    INTERVAL HISTORY:  Mr. Doig 69 y.o. male seen for follow-up of metastatic rectal cancer and toxicity instrument prior to chemotherapy.  He had cycle 1 of chemotherapy on 08/01/2019.  He has lost 4 pounds.  He reported decreased eating.  He is drinking  boost 2 cans/day.  He denies any worsening of neuropathy.  He had diarrhea for 1 day.  He reportedly had chills last Wednesday and felt very weak.  He is continuing to feel very weak.  He reports refills for magnesium and mouthwash.  REVIEW OF SYSTEMS:  Review of Systems  Constitutional: Positive for fatigue.  Gastrointestinal: Positive for diarrhea.  Neurological: Positive for dizziness and numbness.  All other systems reviewed and are negative.    PAST MEDICAL/SURGICAL HISTORY:  Past Medical History:  Diagnosis Date   Chronic anticoagulation     Colon cancer ([place]) 07/24/2010   rectal ca, inv adenocarcinoma   Depression 04/01/2011   Diabetes mellitus without complication ([place])    DVT (deep venous thrombosis) ([place]) 05/09/2011   GERD (gastroesophageal reflux disease)    High output ileostomy ([place]) 05/21/2011   History of kidney stones    HTN (hypertension)    Hx of radiation therapy 09/02/10 to 10/14/10   pelvis   Lung metastasis ([facility])    Neuropathy    Peripheral vascular disease ([place])    dvt's,pe   Pneumonia    hx x3   Pulmonary embolism ([place])    Rectal cancer ([place]) 01/15/2011   S/P radiation and concurrent 5-FU continuous infusion from 09/09/10- 10/10/10.  S/P proctectomy with colorectal anastomosis and diverting loop ileostomy on 11/14/10 at [facility] by Dr. Harlon Ditty. Pathology reveals a pT3b N1 with 3/20 lymph nodes.     Past Surgical History:  Procedure Laterality Date   BILIARY STENT PLACEMENT N/A 09/16/2018   Procedure: STENT PLACEMENT;  Surgeon: Rogene Houston, MD;  [facility];  Service: Endoscopy;  Laterality: N/A;   BILIARY STENT PLACEMENT N/A 07/18/2019   Procedure: BILIARY STENT PLACEMENT;  Surgeon: Rogene Houston, MD;  [facility];  Service: Endoscopy;  Laterality: N/A;   BIOPSY  09/24/2018   Procedure: BIOPSY;  Surgeon: Danie Binder, MD;  [facility];  Service: Endoscopy;;   COLON SURGERY  11/14/2010   proctectomy with colorectal anastomosis and diverting loop ileostomy (temporary planned)   COLONOSCOPY  07/2010   proximal rectal apple core mass 10-14cm from anal verge (adenocarcinoma), 2-3cm distal rectal carpet polyp s/p piecemeal snare polypectomy (adenoma)   COLONOSCOPY  04/21/2012   RMR: Friable,fibrotic appearing colorectal anastomosis producing some luminal narrowing-not felt to be critical. path: focal erosion with slight inflammation and hyperemia. SURVEILLANCE DUE DEC 2015   COLONOSCOPY N/A 11/24/2013   Dr. Rourk:somewhat fibrotic/friable  anastomotic mucosal-status post biopsy (narrowing not felt to be clinically significant) Single colonic diverticulum. benign polypoid rectal mucosa   colostomy reversal  april 2013   ERCP N/A 09/16/2018   Procedure: ENDOSCOPIC RETROGRADE CHOLANGIOPANCREATOGRAPHY (ERCP);  Surgeon: Rogene Houston, MD;  [facility];  Service: Endoscopy;  Laterality: N/A;   ERCP N/A 07/18/2019   Procedure: ENDOSCOPIC RETROGRADE CHOLANGIOPANCREATOGRAPHY (ERCP);  Surgeon: Rogene Houston, MD;  [facility];  Service: Endoscopy;  Laterality: N/A;   ESOPHAGOGASTRODUODENOSCOPY  07/2010   RMR: schatki ring s/p dilation, small hh, SB bx benign   ESOPHAGOGASTRODUODENOSCOPY N/A 09/04/2015   Procedure: ESOPHAGOGASTRODUODENOSCOPY (EGD);  Surgeon: Daneil Dolin, MD;  [facility];  Service: Endoscopy;  Laterality: N/A;  730   EUS  08/2010   Dr. Owens Loffler. uT3N0 circumferential, nearly obstruction rectosigmoid adenocarcinoma, distal edge 12cm from anal verge   EUS N/A 06/19/2016   Procedure: UPPER ENDOSCOPIC ULTRASOUND (EUS) RADIAL;  Surgeon: Milus Banister, MD;  Location: [facility];  Service: Endoscopy;  Laterality: N/A;   FLEXIBLE SIGMOIDOSCOPY N/A 09/24/2018   Procedure: FLEXIBLE SIGMOIDOSCOPY;  Surgeon: Danie Binder, MD;  [facility];  Service: Endoscopy;  Laterality: N/A;   HERNIA REPAIR     abd hernia repair   IVC filter     ivc filter     port a cath placement     PORT-A-CATH REMOVAL  09/24/2011   Procedure: REMOVAL PORT-A-CATH;  Surgeon: Donato Heinz, MD;  Location: [facility];  Service: General;  Laterality: N/A;  Minor Room   PORTACATH PLACEMENT Right 07/07/2016   Procedure: INSERTION PORT-A-CATH;  Surgeon: Aviva Signs, MD;  Location: [facility];  Service: General;  Laterality: Right;   SPHINCTEROTOMY N/A 09/16/2018   Procedure: SPHINCTEROTOMY with balloon dialation;  Surgeon: Rogene Houston, MD;  [facility];  Service: Endoscopy;   Laterality: N/A;   TRANSVERSE LOOP COLOSTOMY N/A 09/29/2018   Procedure: TRANSVERSE LOOP COLOSTOMY;  Surgeon: Aviva Signs, MD;  Location: [facility];  Service: General;  Laterality: N/A;   VIDEO ASSISTED THORACOSCOPY (VATS)/WEDGE RESECTION Right 11/15/2014   Procedure: VIDEO ASSISTED THORACOSCOPY (VATS)/LUNG RESECTION WITH RIGHT LINGULECTOMY;  Surgeon: [name], MD;  Location: [place];  Service: Thoracic;  Laterality: Right;   VIDEO BRONCHOSCOPY N/A 11/15/2014   Procedure: VIDEO BRONCHOSCOPY;  Surgeon: [name], MD;  Location: [facility];  Service: Thoracic;  Laterality: N/A;     SOCIAL HISTORY:  Social History   Socioeconomic History   Marital status: Married    Spouse name: Not on file   Number of children: 2   Years of education: Not on file   Highest education level: Not on file  Occupational History   Occupation: self-employed Regulatory affairs officer, currently not working    Employer: SELF EMPLOYED  Tobacco Use   Smoking status: Never Smoker   Smokeless tobacco: Never Used  Substance and Sexual Activity   Alcohol use: No   Drug use: No   Sexual activity: Yes    Birth control/protection: None    Comment: married  Other Topics Concern   Not on file  Social History Narrative   Not on file   Social Determinants of Health   Financial Resource Strain: Low Risk    Difficulty of Paying Living Expenses: Not very hard  Food Insecurity: No Food Insecurity   Worried About Charity fundraiser in the Last Year: Never true   [place] in the Last Year: Never true  Transportation Needs: No Transportation Needs   Lack of Transportation (Medical): No   Lack of Transportation (Non-Medical): No  Physical Activity: Inactive   Days of Exercise per Week: 0 days   Minutes of Exercise per Session: 0 min  Stress: No Stress Concern Present   Feeling of Stress : Only a little  Social Connections: Unknown   Frequency of Communication with Friends  and Family: More than three times a week   Frequency of Social Gatherings with Friends and Family: Once a week   Attends Religious Services: Patient refused   Marine scientist or Organizations: Patient refused   Attends Music therapist: Patient refused   Marital Status: Patient refused  Intimate Partner Violence: Unknown   Fear of Current or Ex-Partner: Patient refused   Emotionally Abused: Patient refused   Physically Abused: Patient refused   Sexually Abused: Patient refused    FAMILY HISTORY:  Family History  Problem Relation Age of Onset   Cancer  Brother        throat   Cancer Brother        prostate   Colon cancer Neg Hx    Liver disease Neg Hx    Inflammatory bowel disease Neg Hx     CURRENT MEDICATIONS:  Outpatient Encounter Medications as of 08/15/2019  Medication Sig   acetaminophen (TYLENOL) 500 MG tablet Take 1,000 mg by mouth every 6 (six) hours as needed for mild pain or moderate pain.    clonazePAM (KLONOPIN) 1 MG tablet TAKE 1 TABLET BY MOUTH THREE TIMES DAILY AS NEEDED   gabapentin (NEURONTIN) 300 MG capsule Take 3 capsules (900 mg total) by mouth 2 (two) times daily.   glimepiride (AMARYL) 2 MG tablet Take 2 mg by mouth 2 (two) times daily before a meal.    magic mouthwash w/lidocaine SOLN Take 15 mLs by mouth in the morning and at bedtime.   magnesium oxide (MAG-OX) 400 (241.3 Mg) MG tablet Take 1 tablet (400 mg total) by mouth 3 (three) times daily.   mirtazapine (REMERON) 15 MG tablet Take 7.5 mg by mouth at bedtime.   oxyCODONE (ROXICODONE) 15 MG immediate release tablet TAKE 1 TABLET BY MOUTH EVERY 4 HOURS AS NEEDED FOR PAIN   pantoprazole (PROTONIX) 40 MG tablet Take 40 mg by mouth every evening.    Probiotic Product (PROBIOTIC DAILY PO) Take 1 capsule by mouth 2 (two) times daily.    prochlorperazine (COMPAZINE) 10 MG tablet Take 1 tablet (10 mg total) by mouth every 6 (six) hours as needed for nausea or  vomiting.   rivaroxaban (XARELTO) 20 MG TABS tablet Take 1 tablet (20 mg total) by mouth daily with supper.   vitamin B-12 (CYANOCOBALAMIN) 1000 MCG tablet Take 1,000 mcg by mouth daily.   Facility-Administered Encounter Medications as of 08/15/2019  Medication   sodium chloride 0.9 % injection 10 mL   sodium chloride flush (NS) 0.9 % injection 10 mL    ALLERGIES:  Allergies  Allergen Reactions   Oxycodone     Blisters, hallucinations (takes Percocet at home)   Tramadol     Blisters, hallucinations   Trazodone And Nefazodone Other (See Comments)    hallucinations     PHYSICAL EXAM:  ECOG Performance status: 1  There were no vitals filed for this visit. There were no vitals filed for this visit.  Physical Exam Constitutional:      Appearance: Normal appearance.  HENT:     Head: Normocephalic.     Nose: Nose normal.     Mouth/Throat:     Mouth: Mucous membranes are moist.     Pharynx: Oropharynx is clear.  Eyes:     Extraocular Movements: Extraocular movements intact.     Conjunctiva/sclera: Conjunctivae normal.  Cardiovascular:     Rate and Rhythm: Regular rhythm. Tachycardia present.     Pulses: Normal pulses.     Heart sounds: Normal heart sounds.  Pulmonary:     Effort: Pulmonary effort is normal.     Breath sounds: Normal breath sounds.  Abdominal:     General: Abdomen is flat. Bowel sounds are normal.     Palpations: Abdomen is soft.  Musculoskeletal:        General: Normal range of motion.     Cervical back: Normal range of motion.  Skin:    General: Skin is warm and dry.  Neurological:     General: No focal deficit present.     Mental Status: He is alert and oriented  to person, place, and time.  Psychiatric:        Mood and Affect: Mood normal.        Behavior: Behavior normal.        Thought Content: Thought content normal.        Judgment: Judgment normal.      LABORATORY DATA:  I have reviewed the labs as listed.  CBC    Component  Value Date/Time   WBC 7.5 08/15/2019 0758   RBC 4.02 (L) 08/15/2019 0758   HGB 11.1 (L) 08/15/2019 0758   HGB 11.6 (L) 02/04/2011 1059   HCT 36.6 (L) 08/15/2019 0758   HCT 34.7 (L) 02/04/2011 1059   PLT 334 08/15/2019 0758   PLT 282 02/04/2011 1059   MCV 91.0 08/15/2019 0758   MCV 76.0 (L) 02/04/2011 1059   MCH 27.6 08/15/2019 0758   MCHC 30.3 08/15/2019 0758   RDW 14.5 08/15/2019 0758   RDW 18.5 (H) 02/04/2011 1059   LYMPHSABS 1.1 08/15/2019 0758   LYMPHSABS 1.1 02/04/2011 1059   MONOABS 0.5 08/15/2019 0758   MONOABS 0.5 02/04/2011 1059   EOSABS 0.5 08/15/2019 0758   EOSABS 0.1 02/04/2011 1059   BASOSABS 0.0 08/15/2019 0758   BASOSABS 0.1 02/04/2011 1059   CMP Latest Ref Rng & Units 08/15/2019 08/08/2019 08/01/2019  Glucose 70 - 99 mg/dL 174(H) 209(H) 281(H)  BUN 8 - 23 mg/dL 7(L) 10 12  Creatinine 0.61 - 1.24 mg/dL 0.62 0.74 0.70  Sodium 135 - 145 mmol/L 137 135 132(L)  Potassium 3.5 - 5.1 mmol/L 3.9 3.2(L) 4.1  Chloride 98 - 111 mmol/L 102 98 99  CO2 22 - 32 mmol/L _0 Calcium 8.9 - 10.3 mg/dL 8.6(L) 8.6(L) 8.3(L)  Total Protein 6.5 - 8.1 g/dL 6.2(L) 6.1(L) 5.9(L)  Total Bilirubin 0.3 - 1.2 mg/dL 0.9 1.9(H) 1.4(H)  Alkaline Phos 38 - 126 U/L 369(H) 534(H) 595(H)  AST 15 - 41 U/L 26 42(H) 33  ALT 0 - 44 U/L 26 43 37    I have reviewed the scans.   ASSESSMENT & PLAN:   Rectal cancer ([place]) 1.  Stage IV rectal cancer with lung and pancreatic metastasis: -K-ras mutation positive, NRAS wild-type, MSI stable -Recent progression on FOLFIRI. -CTAP on 07/16/2019 showed increase in size of the right lobe of the liver mass measuring 2.1 cm, previously 7 mm.  New small hypodense lesions in segment 7.  Pancreatic mass has slightly improved. -FOLFOX with 20% dose reduction on 08/01/2019. -He has felt very better after chemotherapy.  He lost 4 pounds.  His eating has gone down.  He feels very weak today. -I have reviewed his labs.  They are adequate to proceed with next cycle.   CEA was 46.6 on 08/01/2019. -Because of his weakness, I would hold off on his treatment today. -Since his blood pressure is 94/56 and he is having dizziness, we will give him 1 L of saline with electrolytes over 2 hours. -If he improves well, I will consider adding Avastin during cycle 2.  He will be seen back next week.  2.  Right lateral chest wall pain: -CT on 07/16/2019 shows right posterior rib lesion increased.  He is taking oxycodone 15 mg every 4 hours which is helping.  3.  Anxiety: -He will continue Klonopin 1 mg twice daily.  4.  Neuropathy: -He has numbness in the feet from prior oxaliplatin.  I have cut back on the dose of oxaliplatin.  It is not any worse.  He is taking gabapentin 900 mg twice daily.  5.  Hypomagnesemia: -Magnesium today is 1.7.  He will continue magnesium twice daily.  6.  DVT and PE: -We will continue Xarelto.  He does not report any bleeding.  7.  Weight loss: -He lost about 4 pounds since cycle 1 of FOLFOX 2 weeks ago.  I will hold off on his treatment today. -He has decreased eating.  He is drinking boost 2 cans/day.    Orders placed this encounter:  Orders Placed This Encounter  Procedures   CBC with Differential/Platelet   Comprehensive metabolic panel   Magnesium   CEA      Derek Jack, MD  [facility] [phone]

## 2019-08-15 NOTE — Patient Instructions (Signed)
[place] at [facility] Discharge Instructions  You were seen today by Dr. [name]. He went over your recent lab results. Start taking your allergy medication that you normally take. He will see you back in 1 week for labs and follow up.   Thank you for choosing [facility] at [facility] to provide your oncology and hematology care.  To afford each patient quality time with our provider, please arrive at least 15 minutes before your scheduled appointment time.   If you have a lab appointment with the [place] please come in thru the  Main Entrance and check in at the main information desk  You need to re-schedule your appointment should you arrive 10 or more minutes late.  We strive to give you quality time with our providers, and arriving late affects you and other patients whose appointments are after yours.  Also, if you no show three or more times for appointments you may be dismissed from the clinic at the providers discretion.     Again, thank you for choosing [facility].  Our hope is that these requests will decrease the amount of time that you wait before being seen by our physicians.       _____________________________________________________________  Should you have questions after your visit to [facility], please contact our office at [phone] between the hours of 8:00 a.m. and 4:30 p.m.  Voicemails left after 4:00 p.m. will not be returned until the following business day.  For prescription refill requests, have your pharmacy contact our office and allow 72 hours.    Cancer Center Support Programs:   > Cancer Support Group  2nd Tuesday of the month 1pm-2pm, Journey Room

## 2019-08-15 NOTE — Patient Instructions (Signed)
[facility] at [facility]  Discharge Instructions:   _______________________________________________________________  Thank you for choosing [facility] at [facility] to provide your oncology and hematology care.  To afford each patient quality time with our providers, please arrive at least 15 minutes before your scheduled appointment.  You need to re-schedule your appointment if you arrive 10 or more minutes late.  We strive to give you quality time with our providers, and arriving late affects you and other patients whose appointments are after yours.  Also, if you no show three or more times for appointments you may be dismissed from the clinic.  Again, thank you for choosing [facility] at [facility]. Our hope is that these requests will allow you access to exceptional care and in a timely manner. _______________________________________________________________  If you have questions after your visit, please contact our office at [phone] between the hours of 8:30 a.m. and 5:00 p.m. Voicemails left after 4:30 p.m. will not be returned until the following business day. _______________________________________________________________  For prescription refill requests, have your pharmacy contact our office. _______________________________________________________________  Recommendations made by the consultant and any test results will be sent to your referring physician. _______________________________________________________________ 

## 2019-08-16 LAB — CEA: CEA: 37.5 ng/mL — ABNORMAL HIGH (ref 0.0–4.7)

## 2019-08-17 ENCOUNTER — Encounter (HOSPITAL_COMMUNITY): Payer: Medicare Other

## 2019-08-18 ENCOUNTER — Other Ambulatory Visit (HOSPITAL_COMMUNITY): Payer: Self-pay | Admitting: *Deleted

## 2019-08-18 NOTE — Telephone Encounter (Signed)
Printed prescription for magic mouthwash with lidocaine was called in to patients pharamcy. I spoke with Caryl Pina, RPhT and gave verbal order.

## 2019-08-22 ENCOUNTER — Inpatient Hospital Stay (HOSPITAL_BASED_OUTPATIENT_CLINIC_OR_DEPARTMENT_OTHER): Payer: Medicare Other | Admitting: Hematology

## 2019-08-22 ENCOUNTER — Inpatient Hospital Stay (HOSPITAL_COMMUNITY): Payer: Medicare Other

## 2019-08-22 ENCOUNTER — Other Ambulatory Visit: Payer: Self-pay

## 2019-08-22 ENCOUNTER — Encounter (HOSPITAL_COMMUNITY): Payer: Self-pay | Admitting: Hematology

## 2019-08-22 ENCOUNTER — Encounter (HOSPITAL_COMMUNITY): Payer: Self-pay

## 2019-08-22 VITALS — BP 117/63 | HR 81 | Temp 97.7°F | Resp 18

## 2019-08-22 DIAGNOSIS — C7889 Secondary malignant neoplasm of other digestive organs: Secondary | ICD-10-CM | POA: Diagnosis not present

## 2019-08-22 DIAGNOSIS — C20 Malignant neoplasm of rectum: Secondary | ICD-10-CM

## 2019-08-22 DIAGNOSIS — Z5189 Encounter for other specified aftercare: Secondary | ICD-10-CM | POA: Diagnosis not present

## 2019-08-22 DIAGNOSIS — E876 Hypokalemia: Secondary | ICD-10-CM

## 2019-08-22 DIAGNOSIS — C7800 Secondary malignant neoplasm of unspecified lung: Secondary | ICD-10-CM | POA: Diagnosis not present

## 2019-08-22 DIAGNOSIS — Z5111 Encounter for antineoplastic chemotherapy: Secondary | ICD-10-CM | POA: Diagnosis not present

## 2019-08-22 LAB — COMPREHENSIVE METABOLIC PANEL
ALT: 16 U/L (ref 0–44)
AST: 21 U/L (ref 15–41)
Albumin: 2.8 g/dL — ABNORMAL LOW (ref 3.5–5.0)
Alkaline Phosphatase: 319 U/L — ABNORMAL HIGH (ref 38–126)
Anion gap: 12 (ref 5–15)
BUN: 12 mg/dL (ref 8–23)
CO2: 26 mmol/L (ref 22–32)
Calcium: 8.6 mg/dL — ABNORMAL LOW (ref 8.9–10.3)
Chloride: 98 mmol/L (ref 98–111)
Creatinine, Ser: 0.64 mg/dL (ref 0.61–1.24)
GFR calc Af Amer: >60 mL/min (ref >60)
GFR calc non Af Amer: >60 mL/min (ref >60)
Glucose, Bld: 227 mg/dL — ABNORMAL HIGH (ref 70–99)
Potassium: 3.3 mmol/L — ABNORMAL LOW (ref 3.5–5.1)
Sodium: 136 mmol/L (ref 135–145)
Total Bilirubin: 0.9 mg/dL (ref 0.3–1.2)
Total Protein: 6.1 g/dL — ABNORMAL LOW (ref 6.5–8.1)

## 2019-08-22 LAB — CBC WITH DIFFERENTIAL/PLATELET
Abs Immature Granulocytes: 0.03 10*3/uL (ref 0.00–0.07)
Basophils Absolute: 0.1 10*3/uL (ref 0.0–0.1)
Basophils Relative: 1 %
Eosinophils Absolute: 0.1 10*3/uL (ref 0.0–0.5)
Eosinophils Relative: 1 %
HCT: 37.4 % — ABNORMAL LOW (ref 39.0–52.0)
Hemoglobin: 11.4 g/dL — ABNORMAL LOW (ref 13.0–17.0)
Immature Granulocytes: 0 %
Lymphocytes Relative: 16 %
Lymphs Abs: 1.4 10*3/uL (ref 0.7–4.0)
MCH: 27.6 pg (ref 26.0–34.0)
MCHC: 30.5 g/dL (ref 30.0–36.0)
MCV: 90.6 fL (ref 80.0–100.0)
Monocytes Absolute: 0.7 10*3/uL (ref 0.1–1.0)
Monocytes Relative: 8 %
Neutro Abs: 6.6 10*3/uL (ref 1.7–7.7)
Neutrophils Relative %: 74 %
Platelets: 296 10*3/uL (ref 150–400)
RBC: 4.13 MIL/uL — ABNORMAL LOW (ref 4.22–5.81)
RDW: 14.8 % (ref 11.5–15.5)
WBC: 8.8 10*3/uL (ref 4.0–10.5)
nRBC: 0 % (ref 0.0–0.2)

## 2019-08-22 LAB — MAGNESIUM: Magnesium: 1.6 mg/dL — ABNORMAL LOW (ref 1.7–2.4)

## 2019-08-22 MED ORDER — METHYLPREDNISOLONE SODIUM SUCC 125 MG IJ SOLR
125.0000 mg | Freq: Once | INTRAMUSCULAR | Status: AC | PRN
  Administered 2019-08-22: 11:00:00 125 mg via INTRAVENOUS

## 2019-08-22 MED ORDER — SODIUM CHLORIDE 0.9 % IV SOLN: Freq: Once | INTRAVENOUS | Status: DC | PRN

## 2019-08-22 MED ORDER — OXALIPLATIN CHEMO INJECTION 100 MG/20ML
56.6667 mg/m2 | Freq: Once | INTRAVENOUS | Status: AC
  Administered 2019-08-22: 110 mg via INTRAVENOUS
  Filled 2019-08-22: qty 20

## 2019-08-22 MED ORDER — DIPHENHYDRAMINE HCL 50 MG/ML IJ SOLN
50.0000 mg | Freq: Once | INTRAMUSCULAR | Status: AC | PRN
  Administered 2019-08-22: 50 mg via INTRAVENOUS

## 2019-08-22 MED ORDER — FAMOTIDINE IN NACL 20-0.9 MG/50ML-% IV SOLN
20.0000 mg | Freq: Once | INTRAVENOUS | Status: AC | PRN
  Administered 2019-08-22: 20 mg via INTRAVENOUS

## 2019-08-22 MED ORDER — SODIUM CHLORIDE 0.9 % IV SOLN
10.0000 mg | Freq: Once | INTRAVENOUS | Status: AC
  Administered 2019-08-22: 10:00:00 10 mg via INTRAVENOUS
  Filled 2019-08-22: qty 10

## 2019-08-22 MED ORDER — DEXTROSE 5 % IV SOLN: Freq: Once | INTRAVENOUS | Status: AC

## 2019-08-22 MED ORDER — PALONOSETRON HCL INJECTION 0.25 MG/5ML
0.2500 mg | Freq: Once | INTRAVENOUS | Status: AC
  Administered 2019-08-22: 0.25 mg via INTRAVENOUS
  Filled 2019-08-22: qty 5

## 2019-08-22 MED ORDER — DIPHENHYDRAMINE HCL 50 MG/ML IJ SOLN
INTRAMUSCULAR | Status: AC
  Filled 2019-08-22: qty 1

## 2019-08-22 MED ORDER — FLUOROURACIL CHEMO INJECTION 500 MG/10ML
266.6667 mg/m2 | Freq: Once | INTRAVENOUS | Status: AC
  Administered 2019-08-22: 500 mg via INTRAVENOUS
  Filled 2019-08-22: qty 10

## 2019-08-22 MED ORDER — MAGNESIUM SULFATE 2 GM/50ML IV SOLN
2.0000 g | Freq: Once | INTRAVENOUS | Status: AC
  Administered 2019-08-22: 09:00:00 2 g via INTRAVENOUS
  Filled 2019-08-22: qty 50

## 2019-08-22 MED ORDER — SODIUM CHLORIDE 0.9 % IV SOLN: Freq: Once | INTRAVENOUS | Status: AC

## 2019-08-22 MED ORDER — FAMOTIDINE IN NACL 20-0.9 MG/50ML-% IV SOLN
INTRAVENOUS | Status: AC
  Filled 2019-08-22: qty 50

## 2019-08-22 MED ORDER — SODIUM CHLORIDE 0.9 % IV SOLN
1600.0000 mg/m2 | INTRAVENOUS | Status: DC
  Administered 2019-08-22: 14:00:00 3150 mg via INTRAVENOUS
  Filled 2019-08-22: qty 63

## 2019-08-22 MED ORDER — METHYLPREDNISOLONE SODIUM SUCC 125 MG IJ SOLR
INTRAMUSCULAR | Status: AC
  Filled 2019-08-22: qty 2

## 2019-08-22 MED ORDER — POTASSIUM CHLORIDE CRYS ER 20 MEQ PO TBCR
20.0000 meq | EXTENDED_RELEASE_TABLET | Freq: Once | ORAL | Status: AC
  Administered 2019-08-22: 09:00:00 20 meq via ORAL
  Filled 2019-08-22: qty 1

## 2019-08-22 MED ORDER — SODIUM CHLORIDE 0.9% FLUSH
10.0000 mL | INTRAVENOUS | Status: DC | PRN
  Administered 2019-08-22: 08:00:00 10 mL

## 2019-08-22 MED ORDER — MEGESTROL ACETATE 400 MG/10ML PO SUSP: 400.0000 mg | Freq: Two times a day (BID) | ORAL | 2 refills | Status: AC

## 2019-08-22 MED ORDER — SODIUM CHLORIDE 0.9 % IV SOLN: 5.0000 mg/kg | Freq: Once | INTRAVENOUS | Status: DC

## 2019-08-22 MED ORDER — LEUCOVORIN CALCIUM INJECTION 350 MG
266.6667 mg/m2 | Freq: Once | INTRAVENOUS | Status: AC
  Administered 2019-08-22: 522 mg via INTRAVENOUS
  Filled 2019-08-22: qty 10

## 2019-08-22 NOTE — Progress Notes (Signed)
Patient has been assessed, vital signs and labs have been reviewed by Dr. Katragadda. ANC, Creatinine, LFTs, and Platelets are within treatment parameters per Dr. Katragadda. The patient is good to proceed with treatment at this time.  

## 2019-08-22 NOTE — Progress Notes (Signed)
0838 Labs reviewed with and pt seen by Dr. [name] and pt approved for FOLFOX tx today with dose reductions per MD.       1054 Pt complained of itching behind his ears and nausea as well as his head feels like it's throbbing. Pt's face and neck are flushed but no rash noted and skin is warm and dry. Oxaliplatin and Leucovorin were immediately stopped and NS was started at 1055 am.B/P 108/58, P 90 ,O2 sat 95%,R 20,T 99 temporal. Pt denied any dyspnea or chest pain. [place] Dr. [name] in to see patient. 1058 Benadryl 50 mg given IV 1059 Solumedrol 125 mg given IV. 1101 Pepcid 20 mg given IV ( all per MD orders ) 1101 B/P 114/62, P 81, R 20, O2 sat 96%, T 97.9 Temporal. 1102 Pt reports starting to feel better, face less flushed. 1110 Pt reports his head is no longer throbbing and denies any itching at this time.Face no longer flushed. 1146 Oxaliplatin and Leucovorin infusion restarted per MD orders. B/P 119/63, P 75, R 18, O2 sat 99%, T 97.9 Temporal. Pt denies any issues at this time. 1210 Pt tolerating Oxaliplatin and Leucovorin well without issues                                                                                  [place] tolerated chemo tx well after restarting. Pt discharged with 5FU pump infusing without issues. VSS upon discharge. Pt discharged via wheelchair in satisfactory condition

## 2019-08-22 NOTE — Progress Notes (Signed)
Charles [name], [place] [address]   CLINIC:  Medical Oncology/Hematology  PCP:  [name], MD [place] [address] [phone]   REASON FOR VISIT:  Follow-up for Stage IV Colon Cancer  CURRENT THERAPY: FOLFOX  BRIEF ONCOLOGIC HISTORY:  Oncology History  Rectal cancer ([place])  07/24/2010 Initial Diagnosis   Invasive adenocarcinoma of rectum   09/09/2010 Concurrent Chemotherapy   S/P radiation and concurrent 5-FU continuous infusion from 09/09/10- 10/10/10.   11/14/2010 Surgery   S/P proctectomy with colorectal anastomosis and diverting loop ileostomy on 11/14/10 at [facility] by Dr. Harlon Ditty. Pathology reveals a pT3b N1 with 3/20 lymph nodes.   02/05/2011 - 07/14/2011 Chemotherapy   FOLFOX   08/18/2011 Surgery   Approximate date of surgery- [place] by Dr. Harlon Ditty    Remission     11/24/2013 Survivorship   Colonoscopy- somewhat fibrotic/friable anastomotic mucosal-status post biopsy (narrowing not felt to be clinically significant).  Negative pathology for malignancy   09/12/2014 Imaging   DVT in the left femoral venous system, left common iliac vein, IVC, and within the IVC filter Right lower extremity venography confirms chronic occlusion of the femoral venous system with collateralization. The right iliac venous system is patent and do   09/25/2014 PET scan   The right middle lobe pulmonary nodule is hypermetabolic, favored to represent a primary bronchogenic carcinoma.Equivocal mediastinal nodes, similar to surrounding blood pool. Bilateral adrenal hypermetabolism, felt to be physiologic   10/24/2014 Pathology Results   Lung, needle/core biopsy(ies), RML - ADENOCARCINOMA, SEE COMMENT metastatic adenocarcinoma of a colorectal primary   10/24/2014 Relapse/Recurrence     11/16/2014 Definitive Surgery   Bronchoscopy, right video-assisted thoracoscopy, wedge resection of right middle lobe by Dr. Servando Snare   11/16/2014 Pathology  Results   Lung, wedge biopsy/resection, right lingula and small portion of middle lobe - METASTATIC ADENOCARCINOMA, CONSISTENT WITH COLORECTAL PRIMARY, SPANNING 2.0 CM. - THE SURGICAL RESECTION MARGINS ARE NEGATIVE FOR ADENOCARCINOMA.   11/16/2014 Remission   THE SURGICAL RESECTION MARGINS ARE NEGATIVE FOR ADENOCARCINOMA.   03/07/2015 Imaging   CT CAP- Interval resection of right middle lobe metastasis. No acute process or evidence of metastatic disease in the chest, abdomen or pelvis. Improved right upper lobe reticular nodular opacities are favored to represent resolving infection.   09/14/2015 Imaging   CT CAP- No findings of recurrent malignancy. No recurrence along the wedge resection site of the right middle lobe. Right anterior abdominal wall focal hernia containing a knuckle of small bowel without complicating feature.   06/13/2016 Imaging   CT CAP- 1. New right-sided pleural metastasis/mass. Other smaller right-sided pulmonary nodules which are all pleural-based and most likely represent pleural metastasis. 2. No convincing evidence of abdominopelvic nodal metastasis. 3. Constellation of findings, including pancreatic atrophy, duct dilatation, and pancreatic head soft tissue fullness which are highly suspicious for pancreatic adenocarcinoma. Metastatic disease felt much less likely. Consider endoscopic ultrasound sampling or ERCP. Cannot exclude superimposed acute pancreatitis. 4. New enlargement of the appendiceal tip with subtle surrounding edema. Cannot exclude early or mild appendicitis. 5. Coronary artery atherosclerosis. Aortic atherosclerosis. 6. 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   06/13/2016 Progression   CT scan demonstrates progression of disease   06/19/2016 Procedure   EUS with FNA by Dr. Ardis Hughs   06/20/2016 Pathology Results   FINE NEEDLE ASPIRATION, ENDOSCOPIC, PANCREAS UNCINATE AREA(SPECIMEN 1 OF 1 COLLECTED 06/19/16): MALIGNANT  CELLS CONSISTENT WITH METASTATIC ADENOCARCINOMA.  06/25/2016 PET scan   1. Two pleural-based nodules in the right hemithorax are hypermetabolic. Metastatic disease is a distinct consideration. The scattered pulmonary parenchymal nodule seen on previous diagnostic CT imaging are below the threshold for reliable resolution on PET imaging. 2. Hypermetabolic lesion pancreatic head, consistent with neoplasm. As noted on prior CT, adenocarcinoma is any consideration. No evidence for hypermetabolic abdominal lymphadenopathy. Mottled uptake noted in the liver, but no discrete hepatic metastases are evident on PET imaging. 3. Appendix remains distended up to 0.9-10 mm diameter with a stone towards the tip in subtle periappendiceal edema/inflammation. The appendix is hypermetabolic along its length. Imaging features are relatively stable in the 12 day interval since prior CT scan. While appendicitis is a consideration, the relative stability over 12 days would be unusual for that etiology. Continued close follow-up recommended.   06/26/2016 Pathology Results   Not enough tissue for foundationONE or K-ras testing.   07/07/2016 Procedure   Port placed by Dr. Arnoldo Morale   07/08/2016 -  Chemotherapy   The patient had pegfilgrastim (NEULASTA) injection 6 mg, 6 mg, Subcutaneous,  Once, 0 of 4 cycles  ondansetron (ZOFRAN) IVPB 8 mg, 8 mg, Intravenous,  Once, 0 of 4 cycles  leucovorin 804 mg in dextrose 5 % 250 mL infusion, 400 mg/m2, Intravenous,  Once, 0 of 4 cycles  oxaliplatin (ELOXATIN) 170 mg in dextrose 5 % 500 mL chemo infusion, 85 mg/m2, Intravenous,  Once, 0 of 4 cycles  fluorouracil (ADRUCIL) 4,800 mg in sodium chloride 0.9 % 150 mL chemo infusion, 2,400 mg/m2 = 4,800 mg, Intravenous, 1 Day/Dose, 0 of 4 cycles  fluorouracil (ADRUCIL) chemo injection 800 mg, 400 mg/m2, Intravenous,  Once, 0 of 4 cycles  pegfilgrastim (NEULASTA) injection 6 mg, 6 mg, Subcutaneous,  Once, 2 of 2  cycles  ondansetron (ZOFRAN) IVPB 8 mg, 8 mg, Intravenous,  Once, 12 of 12 cycles  leucovorin 660 mg in dextrose 5 % 250 mL infusion, 660 mg (original dose ), Intravenous,  Once, 12 of 12 cycles Dose modification: 660 mg (Cycle 1)  oxaliplatin (ELOXATIN) 140 mg in dextrose 5 % 500 mL chemo infusion, 140 mg (original dose ), Intravenous,  Once, 12 of 12 cycles Dose modification: 140 mg (Cycle 1), 119 mg (85 % of original dose 140 mg, Cycle 8, Reason: Provider Judgment)  fluorouracil (ADRUCIL) 3,850 mg in sodium chloride 0.9 % 150 mL chemo infusion, 3,850 mg (original dose ), Intravenous, 1 Day/Dose, 12 of 12 cycles Dose modification: 3,850 mg (Cycle 1)  fluorouracil (ADRUCIL) chemo injection 700 mg, 700 mg (original dose ), Intravenous,  Once, 12 of 12 cycles Dose modification: 700 mg (Cycle 1)  palonosetron (ALOXI) injection 0.25 mg, 0.25 mg, Intravenous,  Once, 7 of 12 cycles Administration: 0.25 mg (09/30/2016)  bevacizumab (AVASTIN) 475 mg in sodium chloride 0.9 % 100 mL chemo infusion, 5 mg/kg = 475 mg, Intravenous,  Once, 6 of 11 cycles Administration: 500 mg (09/30/2016)  leucovorin 880 mg in dextrose 5 % 250 mL infusion, 400 mg/m2 = 880 mg, Intravenous,  Once, 7 of 12 cycles Administration: 880 mg (09/30/2016)  oxaliplatin (ELOXATIN) 185 mg in dextrose 5 % 500 mL chemo infusion, 85 mg/m2 = 185 mg, Intravenous,  Once, 7 of 12 cycles Administration: 185 mg (09/30/2016)  fluorouracil (ADRUCIL) chemo injection 900 mg, 400 mg/m2 = 900 mg, Intravenous,  Once, 7 of 12 cycles Administration: 900 mg (09/30/2016)  fluorouracil (ADRUCIL) 5,300 mg in sodium chloride 0.9 % 144 mL chemo infusion, 2,400 mg/m2 = 5,300  mg, Intravenous, 1 Day/Dose, 7 of 12 cycles Administration: 5,300 mg (09/30/2016)  for chemotherapy treatment.     08/27/2016 Imaging   CT CAP- 1. Pulmonary metastatic disease is stable. 2. Pancreatic head mass, grossly stable, with progressive atrophy of the body and tail of the  pancreas. Stable peripancreatic lymph node. 3. Mild circumferential rectal wall thickening, stable. 4. Aortic atherosclerosis (ICD10-170.0). Coronary artery calcification. 5. Hyperattenuating lesion off the lower pole left kidney, stable, too small to characterize. Continued attention on followup exams is warranted. 6. Persistent wall thickening and mild dilatation of the proximal appendix, of uncertain etiology. Continued attention on followup exams is warranted.   11/06/2016 Imaging   CT C/A/P: IMPRESSION: 1. Stable pulmonary metastatic disease. 2. Similar appearing pancreatic head mass. Progressed atrophy of the pancreatic body and tail. Stable peripancreatic lymph node. 3. Stable hypoattenuating lesion partially exophytic off the inferior pole of the left kidney. 4. Aortic atherosclerosis. 5. Persistent mild wall thickening and mild dilatation of the appendix.    01/30/2017 Imaging   Ct C/A/P: IMPRESSION: 1. Stable pulmonary metastatic disease. 2. Stable to slight increase in size of pancreatic head mass. 3.  Aortic Atherosclerosis (ICD10-I70.0). 4. Similar appearance of mild wall thickening of the appendix which contains an appendicolith.   05/01/2017 Imaging   CT C/A/P: Stable disease in the lungs, no progression of disease in the abdomen.   10/04/2018 - 07/26/2019 Chemotherapy   The patient had palonosetron (ALOXI) injection 0.25 mg, 0.25 mg, Intravenous,  Once, 12 of 14 cycles Administration: 0.25 mg (10/04/2018), 0.25 mg (10/25/2018), 0.25 mg (12/01/2018), 0.25 mg (12/15/2018), 0.25 mg (12/29/2018), 0.25 mg (01/12/2019), 0.25 mg (01/26/2019), 0.25 mg (02/09/2019), 0.25 mg (05/11/2019), 0.25 mg (05/25/2019), 0.25 mg (06/08/2019), 0.25 mg (06/22/2019) pegfilgrastim (NEULASTA) injection 6 mg, 6 mg, Subcutaneous, Once, 2 of 2 cycles Administration: 6 mg (10/27/2018) pegfilgrastim-cbqv (UDENYCA) injection 6 mg, 6 mg, Subcutaneous, Once, 10 of 12 cycles Administration: 6 mg (12/03/2018), 6 mg  (12/17/2018), 6 mg (12/31/2018), 6 mg (01/14/2019), 6 mg (01/28/2019), 6 mg (02/11/2019), 6 mg (05/13/2019), 6 mg (05/27/2019), 6 mg (06/10/2019), 6 mg (06/24/2019) bevacizumab (AVASTIN) 450 mg in sodium chloride 0.9 % 100 mL chemo infusion, 5 mg/kg = 450 mg, Intravenous,  Once, 4 of 4 cycles Administration: 400 mg (12/01/2018), 400 mg (12/15/2018), 400 mg (12/29/2018) irinotecan (CAMPTOSAR) 380 mg in sodium chloride 0.9 % 500 mL chemo infusion, 180 mg/m2 = 380 mg, Intravenous,  Once, 12 of 14 cycles Dose modification: 144 mg/m2 (original dose 180 mg/m2, Cycle 2, Reason: Provider Judgment, Comment: 20% dose reduction), 90 mg/m2 (50 % of original dose 180 mg/m2, Cycle 3, Reason: Other (see comments), Comment: [id number]) Administration: 380 mg (10/04/2018), 300 mg (10/25/2018), 200 mg (12/01/2018), 200 mg (12/15/2018), 200 mg (12/29/2018), 200 mg (01/12/2019), 200 mg (01/26/2019), 200 mg (02/09/2019), 180 mg (05/11/2019), 180 mg (05/25/2019), 180 mg (06/08/2019), 180 mg (06/22/2019) leucovorin 900 mg in sodium chloride 0.9 % 250 mL infusion, 421 mg/m2 = 856 mg, Intravenous,  Once, 12 of 14 cycles Dose modification: 320 mg/m2 (original dose 400 mg/m2, Cycle 2, Reason: Provider Judgment, Comment: 20% dose reduction) Administration: 900 mg (10/04/2018), 700 mg (10/25/2018), 700 mg (12/01/2018), 700 mg (12/15/2018), 700 mg (12/29/2018), 700 mg (01/12/2019), 700 mg (01/26/2019), 700 mg (02/09/2019), 700 mg (05/11/2019), 700 mg (05/25/2019), 700 mg (06/08/2019), 700 mg (06/22/2019) fluorouracil (ADRUCIL) chemo injection 850 mg, 400 mg/m2 = 850 mg, Intravenous,  Once, 12 of 14 cycles Dose modification: 320 mg/m2 (original dose 400 mg/m2, Cycle 2, Reason: Provider Judgment, Comment: 20%  dose reduction) Administration: 850 mg (10/04/2018), 700 mg (10/25/2018), 700 mg (12/01/2018), 700 mg (12/15/2018), 700 mg (12/29/2018), 700 mg (01/12/2019), 700 mg (01/26/2019), 700 mg (02/09/2019), 700 mg (05/11/2019), 700 mg (05/25/2019), 700 mg (06/08/2019), 700 mg (06/22/2019) fosaprepitant  (EMEND) 150 mg, dexamethasone (DECADRON) 12 mg in sodium chloride 0.9 % 145 mL IVPB, , Intravenous,  Once, 10 of 12 cycles Administration:  (12/01/2018),  (12/15/2018),  (12/29/2018),  (01/12/2019),  (01/26/2019),  (02/09/2019),  (05/11/2019),  (05/25/2019),  (06/08/2019),  (06/22/2019) fluorouracil (ADRUCIL) 5,000 mg in sodium chloride 0.9 % 150 mL chemo infusion, 2,325 mg/m2 = 5,150 mg, Intravenous, 1 Day/Dose, 12 of 14 cycles Dose modification: 1,920 mg/m2 (original dose 2,400 mg/m2, Cycle 2, Reason: Provider Judgment, Comment: 20% dose reduction) Administration: 5,000 mg (10/04/2018), 4,100 mg (10/25/2018), 4,100 mg (12/01/2018), 4,100 mg (12/15/2018), 4,100 mg (12/29/2018), 4,100 mg (01/12/2019), 4,100 mg (01/26/2019), 4,100 mg (02/09/2019), 4,100 mg (05/11/2019), 4,100 mg (05/25/2019), 4,100 mg (06/08/2019), 4,100 mg (06/22/2019) bevacizumab-bvzr (ZIRABEV) 400 mg in sodium chloride 0.9 % 100 mL chemo infusion, 5 mg/kg = 400 mg (100 % of original dose 5 mg/kg), Intravenous,  Once, 4 of 6 cycles Dose modification: 5 mg/kg (original dose 5 mg/kg, Cycle 6) Administration: 400 mg (01/12/2019), 400 mg (01/26/2019), 400 mg (02/09/2019)  for chemotherapy treatment.    08/01/2019 -  Chemotherapy   The patient had palonosetron (ALOXI) injection 0.25 mg, 0.25 mg, Intravenous,  Once, 2 of 4 cycles Administration: 0.25 mg (08/01/2019), 0.25 mg (08/22/2019) pegfilgrastim-cbqv (UDENYCA) injection 6 mg, 6 mg, Subcutaneous, Once, 2 of 4 cycles Administration: 6 mg (08/03/2019) leucovorin 628 mg in dextrose 5 % 250 mL infusion, 320 mg/m2 = 628 mg (80 % of original dose 400 mg/m2), Intravenous,  Once, 2 of 4 cycles Dose modification: 320 mg/m2 (80 % of original dose 400 mg/m2, Cycle 1, Reason: Change in LFTs), 456.2563 mg/m2 (66.7 % of original dose 400 mg/m2, Cycle 2, Reason: Provider Judgment) Administration: 628 mg (08/01/2019), 522 mg (08/22/2019) oxaliplatin (ELOXATIN) 135 mg in dextrose 5 % 500 mL chemo infusion, 68 mg/m2 = 135 mg (80 % of  original dose 85 mg/m2), Intravenous,  Once, 2 of 4 cycles Dose modification: 68 mg/m2 (80 % of original dose 85 mg/m2, Cycle 1, Reason: Change in LFTs), 56.6667 mg/m2 (66.7 % of original dose 85 mg/m2, Cycle 2, Reason: Provider Judgment) Administration: 135 mg (08/01/2019), 110 mg (08/22/2019) fluorouracil (ADRUCIL) chemo injection 650 mg, 320 mg/m2 = 650 mg (80 % of original dose 400 mg/m2), Intravenous,  Once, 2 of 4 cycles Dose modification: 320 mg/m2 (80 % of original dose 400 mg/m2, Cycle 1, Reason: Change in LFTs), 893.7342 mg/m2 (66.7 % of original dose 400 mg/m2, Cycle 2, Reason: Provider Judgment) Administration: 650 mg (08/01/2019), 500 mg (08/22/2019) fluorouracil (ADRUCIL) 3,750 mg in sodium chloride 0.9 % 75 mL chemo infusion, 1,920 mg/m2 = 3,750 mg (80 % of original dose 2,400 mg/m2), Intravenous, 1 Day/Dose, 2 of 4 cycles Dose modification: 1,920 mg/m2 (80 % of original dose 2,400 mg/m2, Cycle 1, Reason: Change in LFTs), 1,600 mg/m2 (66.7 % of original dose 2,400 mg/m2, Cycle 2, Reason: Provider Judgment) Administration: 3,750 mg (08/01/2019), 3,150 mg (08/22/2019) bevacizumab-bvzr (ZIRABEV) 400 mg in sodium chloride 0.9 % 100 mL chemo infusion, 5 mg/kg = 400 mg, Intravenous,  Once, 1 of 3 cycles  for chemotherapy treatment.       CANCER STAGING: Cancer Staging Rectal cancer [facility]) Staging form: Colon and Rectum, AJCC 7th Edition - Clinical: Stage IIIB (T3, N1, M0) - Signed by  [name], MD on 02/04/2011 - Pathologic stage from 11/17/2014: Stage IVA (M1a) - Signed by Baird Cancer, PA-C on 09/14/2015    INTERVAL HISTORY:  Mr. Konecny 69 y.o. male seen for follow-up of colon cancer.  He feels that his energy levels are better today.  Had fell twice back when he was squatting down on the floor.  Denies any loss of consciousness or dizziness.  Pain is controlled with oxycodone 15 mg 2-3 times a day.  Denies any worsening of numbness in the hands and feet.  REVIEW OF  SYSTEMS:  Review of Systems  Constitutional: Positive for fatigue.  Respiratory: Positive for cough.   Neurological: Positive for dizziness and numbness.  Psychiatric/Behavioral: Positive for sleep disturbance.  All other systems reviewed and are negative.    PAST MEDICAL/SURGICAL HISTORY:  Past Medical History:  Diagnosis Date  . Chronic anticoagulation   . Colon cancer ([place]) 07/24/2010   rectal ca, inv adenocarcinoma  . Depression 04/01/2011  . Diabetes mellitus without complication ([place])   . DVT (deep venous thrombosis) ([place]) 05/09/2011  . GERD (gastroesophageal reflux disease)   . High output ileostomy ([place]) 05/21/2011  . History of kidney stones   . HTN (hypertension)   . Hx of radiation therapy 09/02/10 to 10/14/10   pelvis  . Lung metastasis ([place])   . Neuropathy   . Peripheral vascular disease ([place])    dvt's,pe  . Pneumonia    hx x3  . Pulmonary embolism ([place])   . Rectal cancer ([place]) 01/15/2011   S/P radiation and concurrent 5-FU continuous infusion from 09/09/10- 10/10/10.  S/P proctectomy with colorectal anastomosis and diverting loop ileostomy on 11/14/10 at [facility] by Dr. Harlon Ditty. Pathology reveals a pT3b N1 with 3/20 lymph nodes.     Past Surgical History:  Procedure Laterality Date  . BILIARY STENT PLACEMENT N/A 09/16/2018   Procedure: STENT PLACEMENT;  Surgeon: Rogene Houston, MD;  [facility];  Service: Endoscopy;  Laterality: N/A;  . BILIARY STENT PLACEMENT N/A 07/18/2019   Procedure: BILIARY STENT PLACEMENT;  Surgeon: Rogene Houston, MD;  [facility];  Service: Endoscopy;  Laterality: N/A;  . BIOPSY  09/24/2018   Procedure: BIOPSY;  Surgeon: Danie Binder, MD;  [facility];  Service: Endoscopy;;  . COLON SURGERY  11/14/2010   proctectomy with colorectal anastomosis and diverting loop ileostomy (temporary planned)  . COLONOSCOPY  07/2010   proximal rectal apple core mass 10-14cm from anal verge (adenocarcinoma), 2-3cm  distal rectal carpet polyp s/p piecemeal snare polypectomy (adenoma)  . COLONOSCOPY  04/21/2012   RMR: Friable,fibrotic appearing colorectal anastomosis producing some luminal narrowing-not felt to be critical. path: focal erosion with slight inflammation and hyperemia. SURVEILLANCE DUE DEC 2015  . COLONOSCOPY N/A 11/24/2013   Dr. Rourk:somewhat fibrotic/friable anastomotic mucosal-status post biopsy (narrowing not felt to be clinically significant) Single colonic diverticulum. benign polypoid rectal mucosa  . colostomy reversal  april 2013  . ERCP N/A 09/16/2018   Procedure: ENDOSCOPIC RETROGRADE CHOLANGIOPANCREATOGRAPHY (ERCP);  Surgeon: Rogene Houston, MD;  [facility];  Service: Endoscopy;  Laterality: N/A;  . ERCP N/A 07/18/2019   Procedure: ENDOSCOPIC RETROGRADE CHOLANGIOPANCREATOGRAPHY (ERCP);  Surgeon: Rogene Houston, MD;  [facility];  Service: Endoscopy;  Laterality: N/A;  . ESOPHAGOGASTRODUODENOSCOPY  07/2010   RMR: schatki ring s/p dilation, small hh, SB bx benign  . ESOPHAGOGASTRODUODENOSCOPY N/A 09/04/2015   Procedure: ESOPHAGOGASTRODUODENOSCOPY (EGD);  Surgeon: Daneil Dolin, MD;  [facility];  Service: Endoscopy;  Laterality: N/A;  730  . EUS  08/2010   Dr. Owens Loffler. uT3N0 circumferential, nearly obstruction rectosigmoid adenocarcinoma, distal edge 12cm from anal verge  . EUS N/A 06/19/2016   Procedure: UPPER ENDOSCOPIC ULTRASOUND (EUS) RADIAL;  Surgeon: Milus Banister, MD;  [facility];  Service: Endoscopy;  Laterality: N/A;  . FLEXIBLE SIGMOIDOSCOPY N/A 09/24/2018   Procedure: FLEXIBLE SIGMOIDOSCOPY;  Surgeon: Danie Binder, MD;  [facility];  Service: Endoscopy;  Laterality: N/A;  . HERNIA REPAIR     abd hernia repair  . IVC filter    . ivc filter    . port a cath placement    . PORT-A-CATH REMOVAL  09/24/2011   Procedure: REMOVAL PORT-A-CATH;  Surgeon: Donato Heinz, MD;  Location: [facility];  Service: General;   Laterality: N/A;  Minor Room  . PORTACATH PLACEMENT Right 07/07/2016   Procedure: INSERTION PORT-A-CATH;  Surgeon: Aviva Signs, MD;  Location: [facility];  Service: General;  Laterality: Right;  . SPHINCTEROTOMY N/A 09/16/2018   Procedure: SPHINCTEROTOMY with balloon dialation;  Surgeon: Rogene Houston, MD;  [facility];  Service: Endoscopy;  Laterality: N/A;  . TRANSVERSE LOOP COLOSTOMY N/A 09/29/2018   Procedure: TRANSVERSE LOOP COLOSTOMY;  Surgeon: Aviva Signs, MD;  Location: [facility];  Service: General;  Laterality: N/A;  . VIDEO ASSISTED THORACOSCOPY (VATS)/WEDGE RESECTION Right 11/15/2014   Procedure: VIDEO ASSISTED THORACOSCOPY (VATS)/LUNG RESECTION WITH RIGHT LINGULECTOMY;  Surgeon: [name], MD;  Location: [place] [facility];  Service: Thoracic;  Laterality: Right;  Marland Kitchen VIDEO BRONCHOSCOPY N/A 11/15/2014   Procedure: VIDEO BRONCHOSCOPY;  Surgeon: [name], MD;  Location: [facility] OR;  Service: Thoracic;  Laterality: N/A;     SOCIAL HISTORY:  Social History   Socioeconomic History  . Marital status: Married    Spouse name: Not on file  . Number of children: 2  . Years of education: Not on file  . Highest education level: Not on file  Occupational History  . Occupation: self-employed Regulatory affairs officer, currently not working    Fish farm manager: SELF EMPLOYED  Tobacco Use  . Smoking status: Never Smoker  . Smokeless tobacco: Never Used  Substance and Sexual Activity  . Alcohol use: No  . Drug use: No  . Sexual activity: Yes    Birth control/protection: None    Comment: married  Other Topics Concern  . Not on file  Social History Narrative  . Not on file   Social Determinants of Health   Financial Resource Strain: Low Risk   . Difficulty of Paying Living Expenses: Not very hard  Food Insecurity: No Food Insecurity  . Worried About Charity fundraiser in the Last Year: Never true  . Ran Out of Food in the Last Year: Never true  Transportation Needs: No  Transportation Needs  . Lack of Transportation (Medical): No  . Lack of Transportation (Non-Medical): No  Physical Activity: Inactive  . Days of Exercise per Week: 0 days  . Minutes of Exercise per Session: 0 min  Stress: No Stress Concern Present  . Feeling of Stress : Only a little  Social Connections: Unknown  . Frequency of Communication with Friends and Family: More than three times a week  . Frequency of Social Gatherings with Friends and Family: Once a week  . Attends Religious Services: Patient refused  . Active Member of Clubs or Organizations: Patient refused  . Attends Archivist Meetings: Patient  refused  . Marital Status: Patient refused  Intimate Partner Violence: Unknown  . Fear of Current or Ex-Partner: Patient refused  . Emotionally Abused: Patient refused  . Physically Abused: Patient refused  . Sexually Abused: Patient refused    FAMILY HISTORY:  Family History  Problem Relation Age of Onset  . Cancer Brother        throat  . Cancer Brother        prostate  . Colon cancer Neg Hx   . Liver disease Neg Hx   . Inflammatory bowel disease Neg Hx     CURRENT MEDICATIONS:  Outpatient Encounter Medications as of 08/22/2019  Medication Sig  . acetaminophen (TYLENOL) 500 MG tablet Take 1,000 mg by mouth every 6 (six) hours as needed for mild pain or moderate pain.   . clonazePAM (KLONOPIN) 1 MG tablet TAKE 1 TABLET BY MOUTH THREE TIMES DAILY AS NEEDED  . gabapentin (NEURONTIN) 300 MG capsule Take 3 capsules (900 mg total) by mouth 2 (two) times daily.  Marland Kitchen glimepiride (AMARYL) 2 MG tablet Take 2 mg by mouth 2 (two) times daily before a meal.   . magic mouthwash w/lidocaine SOLN Take 15 mLs by mouth in the morning and at bedtime.  . magnesium oxide (MAG-OX) 400 (241.3 Mg) MG tablet Take 1 tablet (400 mg total) by mouth 3 (three) times daily.  . megestrol (MEGACE) 400 MG/10ML suspension Take 10 mLs (400 mg total) by mouth 2 (two) times daily.  . mirtazapine  (REMERON) 15 MG tablet Take 7.5 mg by mouth at bedtime.  Marland Kitchen oxyCODONE (ROXICODONE) 15 MG immediate release tablet TAKE 1 TABLET BY MOUTH EVERY 4 HOURS AS NEEDED FOR PAIN  . pantoprazole (PROTONIX) 40 MG tablet Take 40 mg by mouth every evening.   . Probiotic Product (PROBIOTIC DAILY PO) Take 1 capsule by mouth 2 (two) times daily.   . prochlorperazine (COMPAZINE) 10 MG tablet Take 1 tablet (10 mg total) by mouth every 6 (six) hours as needed for nausea or vomiting.  . rivaroxaban (XARELTO) 20 MG TABS tablet Take 1 tablet (20 mg total) by mouth daily with supper.  . vitamin B-12 (CYANOCOBALAMIN) 1000 MCG tablet Take 1,000 mcg by mouth daily.   Facility-Administered Encounter Medications as of 08/22/2019  Medication  . sodium chloride 0.9 % injection 10 mL  . sodium chloride flush (NS) 0.9 % injection 10 mL    ALLERGIES:  Allergies  Allergen Reactions  . Oxycodone     Blisters, hallucinations (takes Percocet at home)  . Tramadol     Blisters, hallucinations  . Trazodone And Nefazodone Other (See Comments)    hallucinations     PHYSICAL EXAM:  ECOG Performance status: 1  Vitals:   08/22/19 0801  BP: 116/70  Pulse: (!) 107  Resp: 18  Temp: (!) 97.1 F (36.2 C)  SpO2: 98%   Filed Weights   08/22/19 0801  Weight: 154 lb 9.6 oz (70.1 kg)    Physical Exam Constitutional:      Appearance: Normal appearance.  HENT:     Head: Normocephalic.     Nose: Nose normal.     Mouth/Throat:     Mouth: Mucous membranes are moist.     Pharynx: Oropharynx is clear.  Eyes:     Extraocular Movements: Extraocular movements intact.     Conjunctiva/sclera: Conjunctivae normal.  Cardiovascular:     Rate and Rhythm: Regular rhythm. Tachycardia present.     Pulses: Normal pulses.     Heart sounds:  Normal heart sounds.  Pulmonary:     Effort: Pulmonary effort is normal.     Breath sounds: Normal breath sounds.  Abdominal:     General: Abdomen is flat. Bowel sounds are normal.      Palpations: Abdomen is soft.  Musculoskeletal:        General: Normal range of motion.     Cervical back: Normal range of motion.  Skin:    General: Skin is warm and dry.  Neurological:     General: No focal deficit present.     Mental Status: He is alert and oriented to person, place, and time.  Psychiatric:        Mood and Affect: Mood normal.        Behavior: Behavior normal.        Thought Content: Thought content normal.        Judgment: Judgment normal.      LABORATORY DATA:  I have reviewed the labs as listed.  CBC    Component Value Date/Time   WBC 8.8 08/22/2019 0813   RBC 4.13 (L) 08/22/2019 0813   HGB 11.4 (L) 08/22/2019 0813   HGB 11.6 (L) 02/04/2011 1059   HCT 37.4 (L) 08/22/2019 0813   HCT 34.7 (L) 02/04/2011 1059   PLT 296 08/22/2019 0813   PLT 282 02/04/2011 1059   MCV 90.6 08/22/2019 0813   MCV 76.0 (L) 02/04/2011 1059   MCH 27.6 08/22/2019 0813   MCHC 30.5 08/22/2019 0813   RDW 14.8 08/22/2019 0813   RDW 18.5 (H) 02/04/2011 1059   LYMPHSABS 1.4 08/22/2019 0813   LYMPHSABS 1.1 02/04/2011 1059   MONOABS 0.7 08/22/2019 0813   MONOABS 0.5 02/04/2011 1059   EOSABS 0.1 08/22/2019 0813   EOSABS 0.1 02/04/2011 1059   BASOSABS 0.1 08/22/2019 0813   BASOSABS 0.1 02/04/2011 1059   CMP Latest Ref Rng & Units 08/22/2019 08/15/2019 08/08/2019  Glucose 70 - 99 mg/dL 227(H) 174(H) 209(H)  BUN 8 - 23 mg/dL 12 7(L) 10  Creatinine 0.61 - 1.24 mg/dL 0.64 0.62 0.74  Sodium 135 - 145 mmol/L 136 137 135  Potassium 3.5 - 5.1 mmol/L 3.3(L) 3.9 3.2(L)  Chloride 98 - 111 mmol/L 98 102 98  CO2 22 - 32 mmol/L '26 27 27  '$ Calcium 8.9 - 10.3 mg/dL 8.6(L) 8.6(L) 8.6(L)  Total Protein 6.5 - 8.1 g/dL 6.1(L) 6.2(L) 6.1(L)  Total Bilirubin 0.3 - 1.2 mg/dL 0.9 0.9 1.9(H)  Alkaline Phos 38 - 126 U/L 319(H) 369(H) 534(H)  AST 15 - 41 U/L 21 26 42(H)  ALT 0 - 44 U/L 16 26 43    I have reviewed scans.   ASSESSMENT & PLAN:   Rectal cancer ([place]) 1.  Stage IV rectal cancer with  lung and pancreatic metastasis: -K-ras mutation positive, NRAS wild-type, MSI stable -Recent progression on FOLFIRI with CTAP on 07/16/2019 showing increase in size of the right lobe of the liver mass measuring 2.1 cm, previously 7 mm.  New small hypodensity lesions in segment 7.  Pancreatic mass has slightly improved. -Cycle 1 of FOLFOX with 20% dose reduction on 08/01/2019. -I held his chemotherapy as he lost 4 pounds and he is eating decreased last week.  He was also feeling weak. -His energy levels has improved, however his weight is stable. -I have reviewed his labs.  He will proceed with cycle 2.  I will further dose reduce chemotherapy by 30%. -We will arrange for fluids on Wednesday and next Tuesday.  I will see  him back in 2 weeks for follow-up.  I will still continue to hold Avastin at this time.  2.  Right lateral chest wall pain: -CT scan on 07/16/2019 showed right posterior rib lesion increased. -He is taking oxycodone 15 mg 2-3 times a day which is helping.  3.  Anxiety: -We will continue Klonopin 1 mg twice daily as needed.  4.  Neuropathy: -He has numbness in the feet from prior oxaliplatin.  I have cut back on the dose of oxaliplatin.  Numbness today stable. -he will continue gabapentin 900 mg twice daily.  5.  Hypomagnesemia: -He is taking magnesium twice daily. -Magnesium today is 1.6.  He will receive 2 g of magnesium.  6.  DVT and PE: -He will continue Xarelto.  He does not report any bleeding.  7.  Weight loss: -He lost 4 pounds after her first cycle of FOLFOX.  I held his treatment last week because of that. -He is drinking Ensure 3 cans/day.  I have told him to increase Ensure to 4 cans/day. -His weight has been stable in the last 1 week.  I will start him on Megace 400 mg twice daily.  Addendum: -Few minutes after starting oxaliplatin and leucovorin, he developed redness and feeling hot in the head and scalp region and ears.  Denied any chest pain or breathing  difficulty.  Breathing sounds were normal.  Vitals were normal. -We have given Solu-Medrol 125 mg, Benadryl 50 mg and Pepcid 20 mg. -Once the symptoms subsided, we have started slowly.  He tolerated very well.  Orders placed this encounter:  No orders of the defined types were placed in this encounter.  Total time spent 40 minutes with more than 60% of the time spent face-to-face discussing treatment plan, attending to treatment reaction, counseling and coordination of care.   Derek Jack, MD  [place] [phone]

## 2019-08-22 NOTE — Assessment & Plan Note (Addendum)
1.  Stage IV rectal cancer with lung and pancreatic metastasis: -K-ras mutation positive, NRAS wild-type, MSI stable -Recent progression on FOLFIRI with CTAP on 07/16/2019 showing increase in size of the right lobe of the liver mass measuring 2.1 cm, previously 7 mm.  New small hypodensity lesions in segment 7.  Pancreatic mass has slightly improved. -Cycle 1 of FOLFOX with 20% dose reduction on 08/01/2019. -I held his chemotherapy as he lost 4 pounds and he is eating decreased last week.  He was also feeling weak. -His energy levels has improved, however his weight is stable. -I have reviewed his labs.  He will proceed with cycle 2.  I will further dose reduce chemotherapy by 30%. -We will arrange for fluids on Wednesday and next Tuesday.  I will see him back in 2 weeks for follow-up.  I will still continue to hold Avastin at this time.  2.  Right lateral chest wall pain: -CT scan on 07/16/2019 showed right posterior rib lesion increased. -He is taking oxycodone 15 mg 2-3 times a day which is helping.  3.  Anxiety: -We will continue Klonopin 1 mg twice daily as needed.  4.  Neuropathy: -He has numbness in the feet from prior oxaliplatin.  I have cut back on the dose of oxaliplatin.  Numbness today stable. -he will continue gabapentin 900 mg twice daily.  5.  Hypomagnesemia: -He is taking magnesium twice daily. -Magnesium today is 1.6.  He will receive 2 g of magnesium.  6.  DVT and PE: -He will continue Xarelto.  He does not report any bleeding.  7.  Weight loss: -He lost 4 pounds after her first cycle of FOLFOX.  I held his treatment last week because of that. -He is drinking Ensure 3 cans/day.  I have told him to increase Ensure to 4 cans/day. -His weight has been stable in the last 1 week.  I will start him on Megace 400 mg twice daily.

## 2019-08-22 NOTE — Progress Notes (Signed)
08/22/19  Per Labs:  Give magnesium sulfate 2 mg IVPB x 1 and Potassium Chloride 20 mEq oral x 1 dose while in clinic.  Clarified dose reduction at 30% as entered by MD.  Hold bevacizumab today will evaluate with next cycle. per MD.  T.O. Dr Beckey Downing, LPN/[facility] Daily Ronnald Ramp, PharmD

## 2019-08-22 NOTE — Patient Instructions (Addendum)
[facility] Discharge Instructions for Patients Receiving Chemotherapy   Beginning January 23rd 2017 lab work for the [facility] will be done in the  Main lab at [facility] on 1st floor. If you have a lab appointment with the [place] please come in thru the  Main Entrance and check in at the main information desk   Today you received the following chemotherapy agents Avastin,Oxaliplatin,Leucovorin and 5FU as well as Magnesium infusion. Follow-up as scheduled. Call clinic for any questions or concerns  To help prevent nausea and vomiting after your treatment, we encourage you to take your nausea medications   If you develop nausea and vomiting, or diarrhea that is not controlled by your medication, call the clinic.  The clinic phone number is [phone]. Office hours are Monday-Friday 8:30am-5:00pm.  BELOW ARE SYMPTOMS THAT SHOULD BE REPORTED IMMEDIATELY:  *FEVER GREATER THAN 101.0 F  *CHILLS WITH OR WITHOUT FEVER  NAUSEA AND VOMITING THAT IS NOT CONTROLLED WITH YOUR NAUSEA MEDICATION  *UNUSUAL SHORTNESS OF BREATH  *UNUSUAL BRUISING OR BLEEDING  TENDERNESS IN MOUTH AND THROAT WITH OR WITHOUT PRESENCE OF ULCERS  *URINARY PROBLEMS  *BOWEL PROBLEMS  UNUSUAL RASH Items with * indicate a potential emergency and should be followed up as soon as possible. If you have an emergency after office hours please contact your primary care physician or go to the nearest emergency department.  Please call the clinic during office hours if you have any questions or concerns.   You may also contact the Patient Navigator at [phone] should you have any questions or need assistance in obtaining follow up care.      Resources For Cancer Patients and their Caregivers ? American Cancer Society: Can assist with transportation, wigs, general needs, runs Look Good Feel Better.        431-854-9835 ? Cancer Care: Provides financial assistance, online support  groups, medication/co-pay assistance.  1-800-813-HOPE 570-548-2075) ? [facility] Assists [place] cancer patients and their families through emotional , educational and financial support.  330-607-5292 ? [place] DSS Where to apply for food stamps, Medicaid and utility assistance. (718)118-1236 ? [facility]: Transportation to medical appointments. 2026756763 ? Social Security Administration: May apply for disability if have a Stage IV cancer. (214)843-9241 (856)507-8377 ? [employer], Disability and Transit Services: Assists with nutrition, care and transit needs. 425-197-9171

## 2019-08-22 NOTE — Patient Instructions (Addendum)
[place] at [facility] Discharge Instructions  You were seen today by Dr. [name]. He went over your recent lab results. Continue drinking Ensure at least 3 a day. He will send in a new prescription for Megace 2 Tsp twice a day to help with your appetite. He will see you back in 2 weeks for labs, treatment and follow up.   Thank you for choosing [place] at [facility] to provide your oncology and hematology care.  To afford each patient quality time with our provider, please arrive at least 15 minutes before your scheduled appointment time.   If you have a lab appointment with the [place] please come in thru the  Main Entrance and check in at the main information desk  You need to re-schedule your appointment should you arrive 10 or more minutes late.  We strive to give you quality time with our providers, and arriving late affects you and other patients whose appointments are after yours.  Also, if you no show three or more times for appointments you may be dismissed from the clinic at the providers discretion.     Again, thank you for choosing [facility].  Our hope is that these requests will decrease the amount of time that you wait before being seen by our physicians.       _____________________________________________________________  Should you have questions after your visit to [facility], please contact our office at [phone] between the hours of 8:00 a.m. and 4:30 p.m.  Voicemails left after 4:00 p.m. will not be returned until the following business day.  For prescription refill requests, have your pharmacy contact our office and allow 72 hours.    Cancer Center Support Programs:   > Cancer Support Group  2nd Tuesday of the month 1pm-2pm, Journey Room

## 2019-08-24 ENCOUNTER — Other Ambulatory Visit: Payer: Self-pay

## 2019-08-24 ENCOUNTER — Encounter (HOSPITAL_COMMUNITY): Payer: Medicare Other

## 2019-08-24 ENCOUNTER — Inpatient Hospital Stay (HOSPITAL_COMMUNITY): Payer: Medicare Other

## 2019-08-24 ENCOUNTER — Encounter (HOSPITAL_COMMUNITY): Payer: Self-pay

## 2019-08-24 VITALS — BP 106/61 | HR 91 | Temp 97.7°F | Resp 18

## 2019-08-24 DIAGNOSIS — Z5111 Encounter for antineoplastic chemotherapy: Secondary | ICD-10-CM | POA: Diagnosis not present

## 2019-08-24 DIAGNOSIS — E860 Dehydration: Secondary | ICD-10-CM

## 2019-08-24 DIAGNOSIS — Z5189 Encounter for other specified aftercare: Secondary | ICD-10-CM | POA: Diagnosis not present

## 2019-08-24 DIAGNOSIS — C20 Malignant neoplasm of rectum: Secondary | ICD-10-CM | POA: Diagnosis not present

## 2019-08-24 DIAGNOSIS — C7889 Secondary malignant neoplasm of other digestive organs: Secondary | ICD-10-CM | POA: Diagnosis not present

## 2019-08-24 DIAGNOSIS — C7800 Secondary malignant neoplasm of unspecified lung: Secondary | ICD-10-CM | POA: Diagnosis not present

## 2019-08-24 DIAGNOSIS — R112 Nausea with vomiting, unspecified: Secondary | ICD-10-CM

## 2019-08-24 MED ORDER — HEPARIN SOD (PORK) LOCK FLUSH 100 UNIT/ML IV SOLN
500.0000 [IU] | Freq: Once | INTRAVENOUS | Status: AC | PRN
  Administered 2019-08-24: 500 [IU]

## 2019-08-24 MED ORDER — PEGFILGRASTIM-CBQV 6 MG/0.6ML ~~LOC~~ SOSY
6.0000 mg | PREFILLED_SYRINGE | Freq: Once | SUBCUTANEOUS | Status: AC
  Administered 2019-08-24: 15:00:00 6 mg via SUBCUTANEOUS
  Filled 2019-08-24: qty 0.6

## 2019-08-24 MED ORDER — SODIUM CHLORIDE 0.9 % IV SOLN
Freq: Once | INTRAVENOUS | Status: AC
  Filled 2019-08-24: qty 1000

## 2019-08-24 MED ORDER — SODIUM CHLORIDE 0.9% FLUSH
10.0000 mL | INTRAVENOUS | Status: DC | PRN
  Administered 2019-08-24: 10 mL

## 2019-08-24 NOTE — Patient Instructions (Signed)
[place] at [facility] Discharge Instructions  5FU pump discontinued with IV hydration and Udenyca injection given today. Follow-up as scheduled. Call clinic for any questions or concerns   Thank you for choosing [place] at [facility] to provide your oncology and hematology care.  To afford each patient quality time with our provider, please arrive at least 15 minutes before your scheduled appointment time.   If you have a lab appointment with the [place] please come in thru the Main Entrance and check in at the main information desk.  You need to re-schedule your appointment should you arrive 10 or more minutes late.  We strive to give you quality time with our providers, and arriving late affects you and other patients whose appointments are after yours.  Also, if you no show three or more times for appointments you may be dismissed from the clinic at the providers discretion.     Again, thank you for choosing [facility].  Our hope is that these requests will decrease the amount of time that you wait before being seen by our physicians.       _____________________________________________________________  Should you have questions after your visit to [facility], please contact our office at [phone] between the hours of 8:00 a.m. and 4:30 p.m.  Voicemails left after 4:00 p.m. will not be returned until the following business day.  For prescription refill requests, have your pharmacy contact our office and allow 72 hours.    Due to Covid, you will need to wear a mask upon entering the hospital. If you do not have a mask, a mask will be given to you at the Main Entrance upon arrival. For doctor visits, patients may have 1 support person with them. For treatment visits, patients can not have anyone with them due to social distancing guidelines and our immunocompromised population.

## 2019-08-24 NOTE — Progress Notes (Signed)
Charles [name] tolerated 5FU pump well with incident or complaints. 5FU pump discontinued with IV hydration and Udenyca injection given without issues. VSS upon discharge. Pt discharged via wheelchair in satisfactory condition

## 2019-08-29 ENCOUNTER — Encounter (HOSPITAL_COMMUNITY): Payer: Self-pay | Admitting: *Deleted

## 2019-08-29 ENCOUNTER — Other Ambulatory Visit (HOSPITAL_COMMUNITY): Payer: Self-pay | Admitting: *Deleted

## 2019-08-29 DIAGNOSIS — C20 Malignant neoplasm of rectum: Secondary | ICD-10-CM

## 2019-08-29 MED ORDER — PROCHLORPERAZINE MALEATE 10 MG PO TABS: 10.0000 mg | ORAL_TABLET | Freq: Four times a day (QID) | ORAL | 3 refills | Status: DC | PRN

## 2019-08-29 MED ORDER — OXYCODONE HCL 15 MG PO TABS: 15.0000 mg | ORAL_TABLET | ORAL | 0 refills | Status: AC | PRN

## 2019-08-29 NOTE — Progress Notes (Signed)
Patient's wife called today stating that patient was feeling well and does not want to come tomorrow for fluids.  She wants to move it to Thursday and then if he is still feeling well she will call and cancel that visit too.

## 2019-08-30 ENCOUNTER — Ambulatory Visit (HOSPITAL_COMMUNITY): Payer: Medicare Other

## 2019-09-01 ENCOUNTER — Inpatient Hospital Stay (HOSPITAL_COMMUNITY): Payer: Medicare Other

## 2019-09-01 ENCOUNTER — Encounter (HOSPITAL_COMMUNITY): Payer: Self-pay

## 2019-09-01 ENCOUNTER — Other Ambulatory Visit: Payer: Self-pay

## 2019-09-01 VITALS — BP 133/79 | HR 66 | Temp 98.1°F | Resp 18

## 2019-09-01 DIAGNOSIS — R112 Nausea with vomiting, unspecified: Secondary | ICD-10-CM

## 2019-09-01 DIAGNOSIS — C7889 Secondary malignant neoplasm of other digestive organs: Secondary | ICD-10-CM | POA: Diagnosis not present

## 2019-09-01 DIAGNOSIS — C20 Malignant neoplasm of rectum: Secondary | ICD-10-CM | POA: Diagnosis not present

## 2019-09-01 DIAGNOSIS — C7800 Secondary malignant neoplasm of unspecified lung: Secondary | ICD-10-CM | POA: Diagnosis not present

## 2019-09-01 DIAGNOSIS — Z5189 Encounter for other specified aftercare: Secondary | ICD-10-CM | POA: Diagnosis not present

## 2019-09-01 DIAGNOSIS — Z5111 Encounter for antineoplastic chemotherapy: Secondary | ICD-10-CM | POA: Diagnosis not present

## 2019-09-01 DIAGNOSIS — E860 Dehydration: Secondary | ICD-10-CM

## 2019-09-01 MED ORDER — HEPARIN SOD (PORK) LOCK FLUSH 100 UNIT/ML IV SOLN
500.0000 [IU] | Freq: Once | INTRAVENOUS | Status: AC | PRN
  Administered 2019-09-01: 500 [IU]

## 2019-09-01 MED ORDER — SODIUM CHLORIDE 0.9% FLUSH
10.0000 mL | Freq: Once | INTRAVENOUS | Status: AC | PRN
  Administered 2019-09-01: 10 mL

## 2019-09-01 MED ORDER — SODIUM CHLORIDE 0.9 % IV SOLN
Freq: Once | INTRAVENOUS | Status: AC
  Filled 2019-09-01: qty 1000

## 2019-09-01 NOTE — Progress Notes (Signed)
Patient tolerated hydration with no complaints voiced.  Port site clean and dry with good blood return noted before and after hydration.  No bruising or swelling noted with port.  Band aid applied.  VSS with discharge and left by wheelchair with no s/s of distress noted.

## 2019-09-01 NOTE — Progress Notes (Signed)
Patient presents today for fluids. Verbal order received from Dr. [name]. Infuse (house fluids) Sodium Chloride 0.9% with Potassium Chloride 20 mEq , Magnesium Sulfate 2 Grams  over 2 hours.

## 2019-09-02 DIAGNOSIS — E119 Type 2 diabetes mellitus without complications: Secondary | ICD-10-CM | POA: Diagnosis not present

## 2019-09-02 DIAGNOSIS — I10 Essential (primary) hypertension: Secondary | ICD-10-CM | POA: Diagnosis not present

## 2019-09-05 ENCOUNTER — Encounter (HOSPITAL_COMMUNITY): Payer: Self-pay | Admitting: Hematology

## 2019-09-05 ENCOUNTER — Inpatient Hospital Stay (HOSPITAL_BASED_OUTPATIENT_CLINIC_OR_DEPARTMENT_OTHER): Payer: Medicare Other | Admitting: Hematology

## 2019-09-05 ENCOUNTER — Other Ambulatory Visit: Payer: Self-pay

## 2019-09-05 ENCOUNTER — Inpatient Hospital Stay (HOSPITAL_COMMUNITY): Payer: Medicare Other | Attending: Hematology

## 2019-09-05 ENCOUNTER — Other Ambulatory Visit (HOSPITAL_COMMUNITY): Payer: Self-pay | Admitting: Surgery

## 2019-09-05 ENCOUNTER — Inpatient Hospital Stay (HOSPITAL_COMMUNITY): Payer: Medicare Other

## 2019-09-05 DIAGNOSIS — C7801 Secondary malignant neoplasm of right lung: Secondary | ICD-10-CM | POA: Diagnosis not present

## 2019-09-05 DIAGNOSIS — C20 Malignant neoplasm of rectum: Secondary | ICD-10-CM | POA: Diagnosis not present

## 2019-09-05 DIAGNOSIS — Z7901 Long term (current) use of anticoagulants: Secondary | ICD-10-CM | POA: Insufficient documentation

## 2019-09-05 DIAGNOSIS — I10 Essential (primary) hypertension: Secondary | ICD-10-CM | POA: Diagnosis not present

## 2019-09-05 DIAGNOSIS — Z5111 Encounter for antineoplastic chemotherapy: Secondary | ICD-10-CM | POA: Diagnosis not present

## 2019-09-05 DIAGNOSIS — Z79899 Other long term (current) drug therapy: Secondary | ICD-10-CM | POA: Insufficient documentation

## 2019-09-05 DIAGNOSIS — F419 Anxiety disorder, unspecified: Secondary | ICD-10-CM

## 2019-09-05 DIAGNOSIS — R112 Nausea with vomiting, unspecified: Secondary | ICD-10-CM

## 2019-09-05 DIAGNOSIS — I700 Atherosclerosis of aorta: Secondary | ICD-10-CM | POA: Diagnosis not present

## 2019-09-05 DIAGNOSIS — C7800 Secondary malignant neoplasm of unspecified lung: Secondary | ICD-10-CM

## 2019-09-05 DIAGNOSIS — E860 Dehydration: Secondary | ICD-10-CM

## 2019-09-05 DIAGNOSIS — R197 Diarrhea, unspecified: Secondary | ICD-10-CM

## 2019-09-05 LAB — COMPREHENSIVE METABOLIC PANEL
ALT: 16 U/L (ref 0–44)
AST: 19 U/L (ref 15–41)
Albumin: 3.3 g/dL — ABNORMAL LOW (ref 3.5–5.0)
Alkaline Phosphatase: 232 U/L — ABNORMAL HIGH (ref 38–126)
Anion gap: 10 (ref 5–15)
BUN: 9 mg/dL (ref 8–23)
CO2: 24 mmol/L (ref 22–32)
Calcium: 8.6 mg/dL — ABNORMAL LOW (ref 8.9–10.3)
Chloride: 100 mmol/L (ref 98–111)
Creatinine, Ser: 0.59 mg/dL — ABNORMAL LOW (ref 0.61–1.24)
GFR calc Af Amer: >60 mL/min (ref >60)
GFR calc non Af Amer: >60 mL/min (ref >60)
Glucose, Bld: 188 mg/dL — ABNORMAL HIGH (ref 70–99)
Potassium: 3.5 mmol/L (ref 3.5–5.1)
Sodium: 134 mmol/L — ABNORMAL LOW (ref 135–145)
Total Bilirubin: 1.1 mg/dL (ref 0.3–1.2)
Total Protein: 6.2 g/dL — ABNORMAL LOW (ref 6.5–8.1)

## 2019-09-05 LAB — CBC WITH DIFFERENTIAL/PLATELET
Abs Immature Granulocytes: 0.1 10*3/uL — ABNORMAL HIGH (ref 0.00–0.07)
Basophils Absolute: 0 10*3/uL (ref 0.0–0.1)
Basophils Relative: 0 %
Eosinophils Absolute: 0.1 10*3/uL (ref 0.0–0.5)
Eosinophils Relative: 1 %
HCT: 37.7 % — ABNORMAL LOW (ref 39.0–52.0)
Hemoglobin: 12 g/dL — ABNORMAL LOW (ref 13.0–17.0)
Immature Granulocytes: 1 %
Lymphocytes Relative: 11 %
Lymphs Abs: 1.5 10*3/uL (ref 0.7–4.0)
MCH: 28 pg (ref 26.0–34.0)
MCHC: 31.8 g/dL (ref 30.0–36.0)
MCV: 87.9 fL (ref 80.0–100.0)
Monocytes Absolute: 0.9 10*3/uL (ref 0.1–1.0)
Monocytes Relative: 7 %
Neutro Abs: 10.7 10*3/uL — ABNORMAL HIGH (ref 1.7–7.7)
Neutrophils Relative %: 80 %
Platelets: 270 10*3/uL (ref 150–400)
RBC: 4.29 MIL/uL (ref 4.22–5.81)
RDW: 15.6 % — ABNORMAL HIGH (ref 11.5–15.5)
WBC: 13.3 10*3/uL — ABNORMAL HIGH (ref 4.0–10.5)
nRBC: 0 % (ref 0.0–0.2)

## 2019-09-05 LAB — MAGNESIUM: Magnesium: 1.7 mg/dL (ref 1.7–2.4)

## 2019-09-05 MED ORDER — MIRTAZAPINE 15 MG PO TABS: 15.0000 mg | ORAL_TABLET | Freq: Every day | ORAL | 3 refills | Status: AC

## 2019-09-05 MED ORDER — SODIUM CHLORIDE 0.9 % IV SOLN
Freq: Once | INTRAVENOUS | Status: AC
  Filled 2019-09-05: qty 1000

## 2019-09-05 MED ORDER — SODIUM CHLORIDE 0.9% FLUSH
10.0000 mL | Freq: Once | INTRAVENOUS | Status: AC | PRN
  Administered 2019-09-05: 10 mL

## 2019-09-05 MED ORDER — HEPARIN SOD (PORK) LOCK FLUSH 100 UNIT/ML IV SOLN
500.0000 [IU] | Freq: Once | INTRAVENOUS | Status: AC | PRN
  Administered 2019-09-05: 500 [IU]

## 2019-09-05 MED ORDER — FULVESTRANT 250 MG/5ML IM SOLN
INTRAMUSCULAR | Status: AC
  Filled 2019-09-05: qty 10

## 2019-09-05 NOTE — Patient Instructions (Signed)
[facility] at [facility]  Discharge Instructions:   _______________________________________________________________  Thank you for choosing [facility] at [facility] to provide your oncology and hematology care.  To afford each patient quality time with our providers, please arrive at least 15 minutes before your scheduled appointment.  You need to re-schedule your appointment if you arrive 10 or more minutes late.  We strive to give you quality time with our providers, and arriving late affects you and other patients whose appointments are after yours.  Also, if you no show three or more times for appointments you may be dismissed from the clinic.  Again, thank you for choosing [facility]. Our hope is that these requests will allow you access to exceptional care and in a timely manner. _______________________________________________________________  If you have questions after your visit, please contact our office at [phone] between the hours of 8:30 a.m. and 5:00 p.m. Voicemails left after 4:30 p.m. will not be returned until the following business day. _______________________________________________________________  For prescription refill requests, have your pharmacy contact our office. _______________________________________________________________  Recommendations made by the consultant and any test results will be sent to your referring physician. _______________________________________________________________ 

## 2019-09-05 NOTE — Progress Notes (Signed)
[place] [place], [place] [address]   CLINIC:  Medical Oncology/Hematology  PCP:  [name], MD [place] [address] [phone]   REASON FOR VISIT:  Follow-up for Stage IV Colon Cancer  CURRENT THERAPY: FOLFOX  BRIEF ONCOLOGIC HISTORY:  Oncology History  Rectal cancer ([place])  07/24/2010 Initial Diagnosis   Invasive adenocarcinoma of rectum   09/09/2010 Concurrent Chemotherapy   S/P radiation and concurrent 5-FU continuous infusion from 09/09/10- 10/10/10.   11/14/2010 Surgery   S/P proctectomy with colorectal anastomosis and diverting loop ileostomy on 11/14/10 at [facility] by Dr. Harlon Ditty. Pathology reveals a pT3b N1 with 3/20 lymph nodes.   02/05/2011 - 07/14/2011 Chemotherapy   FOLFOX   08/18/2011 Surgery   Approximate date of surgery- [place] by Dr. Harlon Ditty    Remission     11/24/2013 Survivorship   Colonoscopy- somewhat fibrotic/friable anastomotic mucosal-status post biopsy (narrowing not felt to be clinically significant).  Negative pathology for malignancy   09/12/2014 Imaging   DVT in the left femoral venous system, left common iliac vein, IVC, and within the IVC filter Right lower extremity venography confirms chronic occlusion of the femoral venous system with collateralization. The right iliac venous system is patent and do   09/25/2014 PET scan   The right middle lobe pulmonary nodule is hypermetabolic, favored to represent a primary bronchogenic carcinoma.Equivocal mediastinal nodes, similar to surrounding blood pool. Bilateral adrenal hypermetabolism, felt to be physiologic   10/24/2014 Pathology Results   Lung, needle/core biopsy(ies), RML - ADENOCARCINOMA, SEE COMMENT metastatic adenocarcinoma of a colorectal primary   10/24/2014 Relapse/Recurrence     11/16/2014 Definitive Surgery   Bronchoscopy, right video-assisted thoracoscopy, wedge resection of right middle lobe by Dr. Servando Snare   11/16/2014 Pathology  Results   Lung, wedge biopsy/resection, right lingula and small portion of middle lobe - METASTATIC ADENOCARCINOMA, CONSISTENT WITH COLORECTAL PRIMARY, SPANNING 2.0 CM. - THE SURGICAL RESECTION MARGINS ARE NEGATIVE FOR ADENOCARCINOMA.   11/16/2014 Remission   THE SURGICAL RESECTION MARGINS ARE NEGATIVE FOR ADENOCARCINOMA.   03/07/2015 Imaging   CT CAP- Interval resection of right middle lobe metastasis. No acute process or evidence of metastatic disease in the chest, abdomen or pelvis. Improved right upper lobe reticular nodular opacities are favored to represent resolving infection.   09/14/2015 Imaging   CT CAP- No findings of recurrent malignancy. No recurrence along the wedge resection site of the right middle lobe. Right anterior abdominal wall focal hernia containing a knuckle of small bowel without complicating feature.   06/13/2016 Imaging   CT CAP- 1. New right-sided pleural metastasis/mass. Other smaller right-sided pulmonary nodules which are all pleural-based and most likely represent pleural metastasis. 2. No convincing evidence of abdominopelvic nodal metastasis. 3. Constellation of findings, including pancreatic atrophy, duct dilatation, and pancreatic head soft tissue fullness which are highly suspicious for pancreatic adenocarcinoma. Metastatic disease felt much less likely. Consider endoscopic ultrasound sampling or ERCP. Cannot exclude superimposed acute pancreatitis. 4. New enlargement of the appendiceal tip with subtle surrounding edema. Cannot exclude early or mild appendicitis. 5. Coronary artery atherosclerosis. Aortic atherosclerosis. 6. 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   06/13/2016 Progression   CT scan demonstrates progression of disease   06/19/2016 Procedure   EUS with FNA by Dr. Ardis Hughs   06/20/2016 Pathology Results   FINE NEEDLE ASPIRATION, ENDOSCOPIC, PANCREAS UNCINATE AREA(SPECIMEN 1 OF 1 COLLECTED 06/19/16): MALIGNANT  CELLS CONSISTENT WITH METASTATIC ADENOCARCINOMA.  06/25/2016 PET scan   1. Two pleural-based nodules in the right hemithorax are hypermetabolic. Metastatic disease is a distinct consideration. The scattered pulmonary parenchymal nodule seen on previous diagnostic CT imaging are below the threshold for reliable resolution on PET imaging. 2. Hypermetabolic lesion pancreatic head, consistent with neoplasm. As noted on prior CT, adenocarcinoma is any consideration. No evidence for hypermetabolic abdominal lymphadenopathy. Mottled uptake noted in the liver, but no discrete hepatic metastases are evident on PET imaging. 3. Appendix remains distended up to 0.9-10 mm diameter with a stone towards the tip in subtle periappendiceal edema/inflammation. The appendix is hypermetabolic along its length. Imaging features are relatively stable in the 12 day interval since prior CT scan. While appendicitis is a consideration, the relative stability over 12 days would be unusual for that etiology. Continued close follow-up recommended.   06/26/2016 Pathology Results   Not enough tissue for foundationONE or K-ras testing.   07/07/2016 Procedure   Port placed by Dr. Arnoldo Morale   07/08/2016 -  Chemotherapy   The patient had pegfilgrastim (NEULASTA) injection 6 mg, 6 mg, Subcutaneous,  Once, 0 of 4 cycles  ondansetron (ZOFRAN) IVPB 8 mg, 8 mg, Intravenous,  Once, 0 of 4 cycles  leucovorin 804 mg in dextrose 5 % 250 mL infusion, 400 mg/m2, Intravenous,  Once, 0 of 4 cycles  oxaliplatin (ELOXATIN) 170 mg in dextrose 5 % 500 mL chemo infusion, 85 mg/m2, Intravenous,  Once, 0 of 4 cycles  fluorouracil (ADRUCIL) 4,800 mg in sodium chloride 0.9 % 150 mL chemo infusion, 2,400 mg/m2 = 4,800 mg, Intravenous, 1 Day/Dose, 0 of 4 cycles  fluorouracil (ADRUCIL) chemo injection 800 mg, 400 mg/m2, Intravenous,  Once, 0 of 4 cycles  pegfilgrastim (NEULASTA) injection 6 mg, 6 mg, Subcutaneous,  Once, 2 of 2  cycles  ondansetron (ZOFRAN) IVPB 8 mg, 8 mg, Intravenous,  Once, 12 of 12 cycles  leucovorin 660 mg in dextrose 5 % 250 mL infusion, 660 mg (original dose ), Intravenous,  Once, 12 of 12 cycles Dose modification: 660 mg (Cycle 1)  oxaliplatin (ELOXATIN) 140 mg in dextrose 5 % 500 mL chemo infusion, 140 mg (original dose ), Intravenous,  Once, 12 of 12 cycles Dose modification: 140 mg (Cycle 1), 119 mg (85 % of original dose 140 mg, Cycle 8, Reason: Provider Judgment)  fluorouracil (ADRUCIL) 3,850 mg in sodium chloride 0.9 % 150 mL chemo infusion, 3,850 mg (original dose ), Intravenous, 1 Day/Dose, 12 of 12 cycles Dose modification: 3,850 mg (Cycle 1)  fluorouracil (ADRUCIL) chemo injection 700 mg, 700 mg (original dose ), Intravenous,  Once, 12 of 12 cycles Dose modification: 700 mg (Cycle 1)  palonosetron (ALOXI) injection 0.25 mg, 0.25 mg, Intravenous,  Once, 7 of 12 cycles Administration: 0.25 mg (09/30/2016)  bevacizumab (AVASTIN) 475 mg in sodium chloride 0.9 % 100 mL chemo infusion, 5 mg/kg = 475 mg, Intravenous,  Once, 6 of 11 cycles Administration: 500 mg (09/30/2016)  leucovorin 880 mg in dextrose 5 % 250 mL infusion, 400 mg/m2 = 880 mg, Intravenous,  Once, 7 of 12 cycles Administration: 880 mg (09/30/2016)  oxaliplatin (ELOXATIN) 185 mg in dextrose 5 % 500 mL chemo infusion, 85 mg/m2 = 185 mg, Intravenous,  Once, 7 of 12 cycles Administration: 185 mg (09/30/2016)  fluorouracil (ADRUCIL) chemo injection 900 mg, 400 mg/m2 = 900 mg, Intravenous,  Once, 7 of 12 cycles Administration: 900 mg (09/30/2016)  fluorouracil (ADRUCIL) 5,300 mg in sodium chloride 0.9 % 144 mL chemo infusion, 2,400 mg/m2 = 5,300  mg, Intravenous, 1 Day/Dose, 7 of 12 cycles Administration: 5,300 mg (09/30/2016)  for chemotherapy treatment.     08/27/2016 Imaging   CT CAP- 1. Pulmonary metastatic disease is stable. 2. Pancreatic head mass, grossly stable, with progressive atrophy of the body and tail of the  pancreas. Stable peripancreatic lymph node. 3. Mild circumferential rectal wall thickening, stable. 4. Aortic atherosclerosis (ICD10-170.0). Coronary artery calcification. 5. Hyperattenuating lesion off the lower pole left kidney, stable, too small to characterize. Continued attention on followup exams is warranted. 6. Persistent wall thickening and mild dilatation of the proximal appendix, of uncertain etiology. Continued attention on followup exams is warranted.   11/06/2016 Imaging   CT C/A/P: IMPRESSION: 1. Stable pulmonary metastatic disease. 2. Similar appearing pancreatic head mass. Progressed atrophy of the pancreatic body and tail. Stable peripancreatic lymph node. 3. Stable hypoattenuating lesion partially exophytic off the inferior pole of the left kidney. 4. Aortic atherosclerosis. 5. Persistent mild wall thickening and mild dilatation of the appendix.    01/30/2017 Imaging   Ct C/A/P: IMPRESSION: 1. Stable pulmonary metastatic disease. 2. Stable to slight increase in size of pancreatic head mass. 3.  Aortic Atherosclerosis (ICD10-I70.0). 4. Similar appearance of mild wall thickening of the appendix which contains an appendicolith.   05/01/2017 Imaging   CT C/A/P: Stable disease in the lungs, no progression of disease in the abdomen.   10/04/2018 - 07/26/2019 Chemotherapy   The patient had palonosetron (ALOXI) injection 0.25 mg, 0.25 mg, Intravenous,  Once, 12 of 14 cycles Administration: 0.25 mg (10/04/2018), 0.25 mg (10/25/2018), 0.25 mg (12/01/2018), 0.25 mg (12/15/2018), 0.25 mg (12/29/2018), 0.25 mg (01/12/2019), 0.25 mg (01/26/2019), 0.25 mg (02/09/2019), 0.25 mg (05/11/2019), 0.25 mg (05/25/2019), 0.25 mg (06/08/2019), 0.25 mg (06/22/2019) pegfilgrastim (NEULASTA) injection 6 mg, 6 mg, Subcutaneous, Once, 2 of 2 cycles Administration: 6 mg (10/27/2018) pegfilgrastim-cbqv (UDENYCA) injection 6 mg, 6 mg, Subcutaneous, Once, 10 of 12 cycles Administration: 6 mg (12/03/2018), 6 mg  (12/17/2018), 6 mg (12/31/2018), 6 mg (01/14/2019), 6 mg (01/28/2019), 6 mg (02/11/2019), 6 mg (05/13/2019), 6 mg (05/27/2019), 6 mg (06/10/2019), 6 mg (06/24/2019) bevacizumab (AVASTIN) 450 mg in sodium chloride 0.9 % 100 mL chemo infusion, 5 mg/kg = 450 mg, Intravenous,  Once, 4 of 4 cycles Administration: 400 mg (12/01/2018), 400 mg (12/15/2018), 400 mg (12/29/2018) irinotecan (CAMPTOSAR) 380 mg in sodium chloride 0.9 % 500 mL chemo infusion, 180 mg/m2 = 380 mg, Intravenous,  Once, 12 of 14 cycles Dose modification: 144 mg/m2 (original dose 180 mg/m2, Cycle 2, Reason: Provider Judgment, Comment: 20% dose reduction), 90 mg/m2 (50 % of original dose 180 mg/m2, Cycle 3, Reason: Other (see comments), Comment: [id number]) Administration: 380 mg (10/04/2018), 300 mg (10/25/2018), 200 mg (12/01/2018), 200 mg (12/15/2018), 200 mg (12/29/2018), 200 mg (01/12/2019), 200 mg (01/26/2019), 200 mg (02/09/2019), 180 mg (05/11/2019), 180 mg (05/25/2019), 180 mg (06/08/2019), 180 mg (06/22/2019) leucovorin 900 mg in sodium chloride 0.9 % 250 mL infusion, 421 mg/m2 = 856 mg, Intravenous,  Once, 12 of 14 cycles Dose modification: 320 mg/m2 (original dose 400 mg/m2, Cycle 2, Reason: Provider Judgment, Comment: 20% dose reduction) Administration: 900 mg (10/04/2018), 700 mg (10/25/2018), 700 mg (12/01/2018), 700 mg (12/15/2018), 700 mg (12/29/2018), 700 mg (01/12/2019), 700 mg (01/26/2019), 700 mg (02/09/2019), 700 mg (05/11/2019), 700 mg (05/25/2019), 700 mg (06/08/2019), 700 mg (06/22/2019) fluorouracil (ADRUCIL) chemo injection 850 mg, 400 mg/m2 = 850 mg, Intravenous,  Once, 12 of 14 cycles Dose modification: 320 mg/m2 (original dose 400 mg/m2, Cycle 2, Reason: Provider Judgment, Comment: 20%  dose reduction) Administration: 850 mg (10/04/2018), 700 mg (10/25/2018), 700 mg (12/01/2018), 700 mg (12/15/2018), 700 mg (12/29/2018), 700 mg (01/12/2019), 700 mg (01/26/2019), 700 mg (02/09/2019), 700 mg (05/11/2019), 700 mg (05/25/2019), 700 mg (06/08/2019), 700 mg (06/22/2019) fosaprepitant  (EMEND) 150 mg, dexamethasone (DECADRON) 12 mg in sodium chloride 0.9 % 145 mL IVPB, , Intravenous,  Once, 10 of 12 cycles Administration:  (12/01/2018),  (12/15/2018),  (12/29/2018),  (01/12/2019),  (01/26/2019),  (02/09/2019),  (05/11/2019),  (05/25/2019),  (06/08/2019),  (06/22/2019) fluorouracil (ADRUCIL) 5,000 mg in sodium chloride 0.9 % 150 mL chemo infusion, 2,325 mg/m2 = 5,150 mg, Intravenous, 1 Day/Dose, 12 of 14 cycles Dose modification: 1,920 mg/m2 (original dose 2,400 mg/m2, Cycle 2, Reason: Provider Judgment, Comment: 20% dose reduction) Administration: 5,000 mg (10/04/2018), 4,100 mg (10/25/2018), 4,100 mg (12/01/2018), 4,100 mg (12/15/2018), 4,100 mg (12/29/2018), 4,100 mg (01/12/2019), 4,100 mg (01/26/2019), 4,100 mg (02/09/2019), 4,100 mg (05/11/2019), 4,100 mg (05/25/2019), 4,100 mg (06/08/2019), 4,100 mg (06/22/2019) bevacizumab-bvzr (ZIRABEV) 400 mg in sodium chloride 0.9 % 100 mL chemo infusion, 5 mg/kg = 400 mg (100 % of original dose 5 mg/kg), Intravenous,  Once, 4 of 6 cycles Dose modification: 5 mg/kg (original dose 5 mg/kg, Cycle 6) Administration: 400 mg (01/12/2019), 400 mg (01/26/2019), 400 mg (02/09/2019)  for chemotherapy treatment.    08/01/2019 -  Chemotherapy   The patient had palonosetron (ALOXI) injection 0.25 mg, 0.25 mg, Intravenous,  Once, 2 of 4 cycles Administration: 0.25 mg (08/01/2019), 0.25 mg (08/22/2019) pegfilgrastim-cbqv (UDENYCA) injection 6 mg, 6 mg, Subcutaneous, Once, 2 of 4 cycles Administration: 6 mg (08/03/2019), 6 mg (08/24/2019) leucovorin 628 mg in dextrose 5 % 250 mL infusion, 320 mg/m2 = 628 mg (80 % of original dose 400 mg/m2), Intravenous,  Once, 2 of 4 cycles Dose modification: 320 mg/m2 (80 % of original dose 400 mg/m2, Cycle 1, Reason: Change in LFTs), 280 mg/m2 (70 % of original dose 400 mg/m2, Cycle 3, Reason: Provider Judgment), 419.3790 mg/m2 (66.7 % of original dose 400 mg/m2, Cycle 2, Reason: Provider Judgment) Administration: 628 mg (08/01/2019), 522 mg  (08/22/2019) oxaliplatin (ELOXATIN) 135 mg in dextrose 5 % 500 mL chemo infusion, 68 mg/m2 = 135 mg (80 % of original dose 85 mg/m2), Intravenous,  Once, 2 of 4 cycles Dose modification: 68 mg/m2 (80 % of original dose 85 mg/m2, Cycle 1, Reason: Change in LFTs), 59.5 mg/m2 (70 % of original dose 85 mg/m2, Cycle 3, Reason: Provider Judgment), 24.0973 mg/m2 (66.7 % of original dose 85 mg/m2, Cycle 2, Reason: Provider Judgment) Administration: 135 mg (08/01/2019), 110 mg (08/22/2019) fluorouracil (ADRUCIL) chemo injection 650 mg, 320 mg/m2 = 650 mg (80 % of original dose 400 mg/m2), Intravenous,  Once, 2 of 4 cycles Dose modification: 320 mg/m2 (80 % of original dose 400 mg/m2, Cycle 1, Reason: Change in LFTs), 280 mg/m2 (70 % of original dose 400 mg/m2, Cycle 3, Reason: Provider Judgment), 532.9924 mg/m2 (66.7 % of original dose 400 mg/m2, Cycle 2, Reason: Provider Judgment) Administration: 650 mg (08/01/2019), 500 mg (08/22/2019) fluorouracil (ADRUCIL) 3,750 mg in sodium chloride 0.9 % 75 mL chemo infusion, 1,920 mg/m2 = 3,750 mg (80 % of original dose 2,400 mg/m2), Intravenous, 1 Day/Dose, 2 of 4 cycles Dose modification: 1,920 mg/m2 (80 % of original dose 2,400 mg/m2, Cycle 1, Reason: Change in LFTs), 1,680 mg/m2 (70 % of original dose 2,400 mg/m2, Cycle 3, Reason: Provider Judgment), 1,600 mg/m2 (66.7 % of original dose 2,400 mg/m2, Cycle 2, Reason: Provider Judgment) Administration: 3,750 mg (08/01/2019), 3,150 mg (08/22/2019) bevacizumab-bvzr (  ZIRABEV) 400 mg in sodium chloride 0.9 % 100 mL chemo infusion, 5 mg/kg = 400 mg, Intravenous,  Once, 1 of 3 cycles  for chemotherapy treatment.       CANCER STAGING: Cancer Staging Rectal cancer ([place]) Staging form: Colon and Rectum, AJCC 7th Edition - Clinical: Stage IIIB (T3, N1, M0) - Signed by [name], MD on 02/04/2011 - Pathologic stage from 11/17/2014: Stage IVA (M1a) - Signed by Baird Cancer, PA-C on 09/14/2015    INTERVAL HISTORY:   [name] 69 y.o. male seen for follow-up of metastatic rectal cancer and toxicity assessment prior to next cycle of chemotherapy.  He reportedly had a rough time after last treatment.  He lost 3 pounds.  He could not tolerate Megace because of vomiting.  He was vomiting Megace after taking it.  He is drinking Ensure 3 cans/day.  Solid food sometimes get hung in the back of the throat.  REVIEW OF SYSTEMS:  Review of Systems  Constitutional: Positive for fatigue.  Respiratory: Positive for shortness of breath.   Cardiovascular: Positive for chest pain.  Gastrointestinal: Positive for nausea and vomiting.  Neurological: Positive for dizziness and numbness.  Psychiatric/Behavioral: Positive for depression and sleep disturbance. The patient is nervous/anxious.   All other systems reviewed and are negative.    PAST MEDICAL/SURGICAL HISTORY:  Past Medical History:  Diagnosis Date  . Chronic anticoagulation   . Colon cancer ([place]) 07/24/2010   rectal ca, inv adenocarcinoma  . Depression 04/01/2011  . Diabetes mellitus without complication ([place])   . DVT (deep venous thrombosis) ([place]) 05/09/2011  . GERD (gastroesophageal reflux disease)   . High output ileostomy ([place]) 05/21/2011  . History of kidney stones   . HTN (hypertension)   . Hx of radiation therapy 09/02/10 to 10/14/10   pelvis  . Lung metastasis ([place])   . Neuropathy   . Peripheral vascular disease ([place])    dvt's,pe  . Pneumonia    hx x3  . Pulmonary embolism ([place])   . Rectal cancer ([place]) 01/15/2011   S/P radiation and concurrent 5-FU continuous infusion from 09/09/10- 10/10/10.  S/P proctectomy with colorectal anastomosis and diverting loop ileostomy on 11/14/10 at [facility] by Dr. Harlon Ditty. Pathology reveals a pT3b N1 with 3/20 lymph nodes.     Past Surgical History:  Procedure Laterality Date  . BILIARY STENT PLACEMENT N/A 09/16/2018   Procedure: STENT PLACEMENT;  Surgeon: Rogene Houston, MD;  [facility];   Service: Endoscopy;  Laterality: N/A;  . BILIARY STENT PLACEMENT N/A 07/18/2019   Procedure: BILIARY STENT PLACEMENT;  Surgeon: Rogene Houston, MD;  [facility];  Service: Endoscopy;  Laterality: N/A;  . BIOPSY  09/24/2018   Procedure: BIOPSY;  Surgeon: Danie Binder, MD;  [facility];  Service: Endoscopy;;  . COLON SURGERY  11/14/2010   proctectomy with colorectal anastomosis and diverting loop ileostomy (temporary planned)  . COLONOSCOPY  07/2010   proximal rectal apple core mass 10-14cm from anal verge (adenocarcinoma), 2-3cm distal rectal carpet polyp s/p piecemeal snare polypectomy (adenoma)  . COLONOSCOPY  04/21/2012   RMR: Friable,fibrotic appearing colorectal anastomosis producing some luminal narrowing-not felt to be critical. path: focal erosion with slight inflammation and hyperemia. SURVEILLANCE DUE DEC 2015  . COLONOSCOPY N/A 11/24/2013   Dr. Rourk:somewhat fibrotic/friable anastomotic mucosal-status post biopsy (narrowing not felt to be clinically significant) Single colonic diverticulum. benign polypoid rectal mucosa  . colostomy reversal  april 2013  . ERCP  N/A 09/16/2018   Procedure: ENDOSCOPIC RETROGRADE CHOLANGIOPANCREATOGRAPHY (ERCP);  Surgeon: Rogene Houston, MD;  [facility];  Service: Endoscopy;  Laterality: N/A;  . ERCP N/A 07/18/2019   Procedure: ENDOSCOPIC RETROGRADE CHOLANGIOPANCREATOGRAPHY (ERCP);  Surgeon: Rogene Houston, MD;  [facility];  Service: Endoscopy;  Laterality: N/A;  . ESOPHAGOGASTRODUODENOSCOPY  07/2010   RMR: schatki ring s/p dilation, small hh, SB bx benign  . ESOPHAGOGASTRODUODENOSCOPY N/A 09/04/2015   Procedure: ESOPHAGOGASTRODUODENOSCOPY (EGD);  Surgeon: Daneil Dolin, MD;  [facility];  Service: Endoscopy;  Laterality: N/A;  730  . EUS  08/2010   Dr. Owens Loffler. uT3N0 circumferential, nearly obstruction rectosigmoid adenocarcinoma, distal edge 12cm from anal verge  . EUS N/A 06/19/2016    Procedure: UPPER ENDOSCOPIC ULTRASOUND (EUS) RADIAL;  Surgeon: Milus Banister, MD;  [facility];  Service: Endoscopy;  Laterality: N/A;  . FLEXIBLE SIGMOIDOSCOPY N/A 09/24/2018   Procedure: FLEXIBLE SIGMOIDOSCOPY;  Surgeon: Danie Binder, MD;  [facility];  Service: Endoscopy;  Laterality: N/A;  . HERNIA REPAIR     abd hernia repair  . IVC filter    . ivc filter    . port a cath placement    . PORT-A-CATH REMOVAL  09/24/2011   Procedure: REMOVAL PORT-A-CATH;  Surgeon: Donato Heinz, MD;  Location: [facility];  Service: General;  Laterality: N/A;  Minor Room  . PORTACATH PLACEMENT Right 07/07/2016   Procedure: INSERTION PORT-A-CATH;  Surgeon: Aviva Signs, MD;  Location: [facility];  Service: General;  Laterality: Right;  . SPHINCTEROTOMY N/A 09/16/2018   Procedure: SPHINCTEROTOMY with balloon dialation;  Surgeon: Rogene Houston, MD;  [facility];  Service: Endoscopy;  Laterality: N/A;  . TRANSVERSE LOOP COLOSTOMY N/A 09/29/2018   Procedure: TRANSVERSE LOOP COLOSTOMY;  Surgeon: Aviva Signs, MD;  Location: [facility];  Service: General;  Laterality: N/A;  . VIDEO ASSISTED THORACOSCOPY (VATS)/WEDGE RESECTION Right 11/15/2014   Procedure: VIDEO ASSISTED THORACOSCOPY (VATS)/LUNG RESECTION WITH RIGHT LINGULECTOMY;  Surgeon: [name], MD;  Location: [place];  Service: Thoracic;  Laterality: Right;  Marland Kitchen VIDEO BRONCHOSCOPY N/A 11/15/2014   Procedure: VIDEO BRONCHOSCOPY;  Surgeon: [name], MD;  Location: [facility];  Service: Thoracic;  Laterality: N/A;     SOCIAL HISTORY:  Social History   Socioeconomic History  . Marital status: Married    Spouse name: Not on file  . Number of children: 2  . Years of education: Not on file  . Highest education level: Not on file  Occupational History  . Occupation: self-employed Regulatory affairs officer, currently not working    Fish farm manager: SELF EMPLOYED  Tobacco Use  . Smoking status: Never Smoker  . Smokeless  tobacco: Never Used  Substance and Sexual Activity  . Alcohol use: No  . Drug use: No  . Sexual activity: Yes    Birth control/protection: None    Comment: married  Other Topics Concern  . Not on file  Social History Narrative  . Not on file   Social Determinants of Health   Financial Resource Strain: Low Risk   . Difficulty of Paying Living Expenses: Not very hard  Food Insecurity: No Food Insecurity  . Worried About Charity fundraiser in the Last Year: Never true  . Ran Out of Food in the Last Year: Never true  Transportation Needs: No Transportation Needs  . Lack of Transportation (Medical): No  . Lack of Transportation (Non-Medical): No  Physical Activity: Inactive  .  Days of Exercise per Week: 0 days  . Minutes of Exercise per Session: 0 min  Stress: No Stress Concern Present  . Feeling of Stress : Only a little  Social Connections: Unknown  . Frequency of Communication with Friends and Family: More than three times a week  . Frequency of Social Gatherings with Friends and Family: Once a week  . Attends Religious Services: Patient refused  . Active Member of Clubs or Organizations: Patient refused  . Attends Archivist Meetings: Patient refused  . Marital Status: Patient refused  Intimate Partner Violence: Unknown  . Fear of Current or Ex-Partner: Patient refused  . Emotionally Abused: Patient refused  . Physically Abused: Patient refused  . Sexually Abused: Patient refused    FAMILY HISTORY:  Family History  Problem Relation Age of Onset  . Cancer Brother        throat  . Cancer Brother        prostate  . Colon cancer Neg Hx   . Liver disease Neg Hx   . Inflammatory bowel disease Neg Hx     CURRENT MEDICATIONS:  Outpatient Encounter Medications as of 09/05/2019  Medication Sig  . acetaminophen (TYLENOL) 500 MG tablet Take 1,000 mg by mouth every 6 (six) hours as needed for mild pain or moderate pain.   . clonazePAM (KLONOPIN) 1 MG tablet TAKE 1  TABLET BY MOUTH THREE TIMES DAILY AS NEEDED  . gabapentin (NEURONTIN) 300 MG capsule Take 3 capsules (900 mg total) by mouth 2 (two) times daily.  Marland Kitchen glimepiride (AMARYL) 2 MG tablet Take 2 mg by mouth 2 (two) times daily before a meal.   . magic mouthwash w/lidocaine SOLN Take 15 mLs by mouth in the morning and at bedtime.  . magnesium oxide (MAG-OX) 400 (241.3 Mg) MG tablet Take 1 tablet (400 mg total) by mouth 3 (three) times daily.  . megestrol (MEGACE) 400 MG/10ML suspension Take 10 mLs (400 mg total) by mouth 2 (two) times daily.  . mirtazapine (REMERON) 15 MG tablet Take 7.5 mg by mouth at bedtime.  Marland Kitchen oxyCODONE (ROXICODONE) 15 MG immediate release tablet Take 1 tablet (15 mg total) by mouth every 4 (four) hours as needed. for pain  . pantoprazole (PROTONIX) 40 MG tablet Take 40 mg by mouth every evening.   . Probiotic Product (PROBIOTIC DAILY PO) Take 1 capsule by mouth 2 (two) times daily.   . prochlorperazine (COMPAZINE) 10 MG tablet Take 1 tablet (10 mg total) by mouth every 6 (six) hours as needed for nausea or vomiting.  . rivaroxaban (XARELTO) 20 MG TABS tablet Take 1 tablet (20 mg total) by mouth daily with supper.  . vitamin B-12 (CYANOCOBALAMIN) 1000 MCG tablet Take 1,000 mcg by mouth daily.  . [DISCONTINUED] magnesium oxide (MAG-OX) 400 MG tablet Take 1 tablet by mouth 3 (three) times daily.   Facility-Administered Encounter Medications as of 09/05/2019  Medication  . sodium chloride 0.9 % injection 10 mL  . sodium chloride flush (NS) 0.9 % injection 10 mL    ALLERGIES:  Allergies  Allergen Reactions  . Oxycodone     Blisters, hallucinations (takes Percocet at home)  . Tramadol     Blisters, hallucinations  . Trazodone And Nefazodone Other (See Comments)    hallucinations     PHYSICAL EXAM:  ECOG Performance status: 1  Vitals:   09/05/19 0758  BP: 128/78  Pulse: (!) 114  Resp: 18  Temp: (!) 96.9 F (36.1 C)  SpO2: 100%  Filed Weights   09/05/19 0758   Weight: 151 lb 9.6 oz (68.8 kg)    Physical Exam Constitutional:      Appearance: Normal appearance.  HENT:     Head: Normocephalic.     Nose: Nose normal.     Mouth/Throat:     Mouth: Mucous membranes are moist.     Pharynx: Oropharynx is clear.  Eyes:     Extraocular Movements: Extraocular movements intact.     Conjunctiva/sclera: Conjunctivae normal.  Cardiovascular:     Rate and Rhythm: Regular rhythm. Tachycardia present.     Pulses: Normal pulses.     Heart sounds: Normal heart sounds.  Pulmonary:     Effort: Pulmonary effort is normal.     Breath sounds: Normal breath sounds.  Abdominal:     General: Abdomen is flat. Bowel sounds are normal.     Palpations: Abdomen is soft.  Musculoskeletal:        General: Normal range of motion.     Cervical back: Normal range of motion.  Skin:    General: Skin is warm and dry.  Neurological:     General: No focal deficit present.     Mental Status: He is alert and oriented to person, place, and time.  Psychiatric:        Mood and Affect: Mood normal.        Behavior: Behavior normal.        Thought Content: Thought content normal.        Judgment: Judgment normal.      LABORATORY DATA:  I have reviewed the labs as listed.  CBC    Component Value Date/Time   WBC 13.3 (H) 09/05/2019 0754   RBC 4.29 09/05/2019 0754   HGB 12.0 (L) 09/05/2019 0754   HGB 11.6 (L) 02/04/2011 1059   HCT 37.7 (L) 09/05/2019 0754   HCT 34.7 (L) 02/04/2011 1059   PLT 270 09/05/2019 0754   PLT 282 02/04/2011 1059   MCV 87.9 09/05/2019 0754   MCV 76.0 (L) 02/04/2011 1059   MCH 28.0 09/05/2019 0754   MCHC 31.8 09/05/2019 0754   RDW 15.6 (H) 09/05/2019 0754   RDW 18.5 (H) 02/04/2011 1059   LYMPHSABS 1.5 09/05/2019 0754   LYMPHSABS 1.1 02/04/2011 1059   MONOABS 0.9 09/05/2019 0754   MONOABS 0.5 02/04/2011 1059   EOSABS 0.1 09/05/2019 0754   EOSABS 0.1 02/04/2011 1059   BASOSABS 0.0 09/05/2019 0754   BASOSABS 0.1 02/04/2011 1059   CMP  Latest Ref Rng & Units 09/05/2019 08/22/2019 08/15/2019  Glucose 70 - 99 mg/dL 188(H) 227(H) 174(H)  BUN 8 - 23 mg/dL 9 12 7(L)  Creatinine 0.61 - 1.24 mg/dL 0.59(L) 0.64 0.62  Sodium 135 - 145 mmol/L 134(L) 136 137  Potassium 3.5 - 5.1 mmol/L 3.5 3.3(L) 3.9  Chloride 98 - 111 mmol/L 100 98 102  CO2 22 - 32 mmol/L '24 26 27  '$ Calcium 8.9 - 10.3 mg/dL 8.6(L) 8.6(L) 8.6(L)  Total Protein 6.5 - 8.1 g/dL 6.2(L) 6.1(L) 6.2(L)  Total Bilirubin 0.3 - 1.2 mg/dL 1.1 0.9 0.9  Alkaline Phos 38 - 126 U/L 232(H) 319(H) 369(H)  AST 15 - 41 U/L '19 21 26  '$ ALT 0 - 44 U/L '16 16 26    '$ I have reviewed scans.   ASSESSMENT & PLAN:   Rectal cancer ([place]) 1.  Stage IV rectal cancer with lung and pancreatic metastasis: -2 cycles of FOLFOX with 20% and 30% dose reduction respectively on 08/01/2019 and  08/22/2019. -He lost 3 pounds.  He is still feeling weak today. -Labs were reviewed.  White count is high and platelets are normal.  AST and ALT are normal. -We will hold his chemotherapy today.  He will receive fluids.  I will reevaluate him in 1 week.  2.  Weight loss: -He lost 3 pounds since last visit 2 weeks ago. -He is drinking Ensure 3 cans/day, 150 cal each.  I have told him to drink Glucerna which has slightly more calories. -He reports solid food sometimes gets hung in the back of the throat. -He could not take Megace and took for 4 days and ended up throwing up.  I have told him to take nausea medicine 30 minutes prior to taking Megace.  3.  Right lateral chest wall pain: -He is taking oxycodone 15 mg 3 times a day.  Rarely he requires 4 times a day.  This is from right posterior rib lesion.  4.  Neuropathy: -He will continue gabapentin 900 mg twice daily.  5.  Anxiety: -Continue Klonopin 1 mg twice daily as needed.  6.  Hypomagnesemia: -Continue magnesium twice daily.  Magnesium today is 1.7.  7.  DVT and PE: -Continue Xarelto.  8.  Sleeping difficulty: -He is taking Remeron 7.5 mg daily.  I  have told him to increase it to 15 mg daily.    Orders placed this encounter:  No orders of the defined types were placed in this encounter.     Derek Jack, MD  [facility] [phone]

## 2019-09-05 NOTE — Assessment & Plan Note (Addendum)
1.  Stage IV rectal cancer with lung and pancreatic metastasis: -2 cycles of FOLFOX with 20% and 30% dose reduction respectively on 08/01/2019 and 08/22/2019. -He lost 3 pounds.  He is still feeling weak today. -Labs were reviewed.  White count is high and platelets are normal.  AST and ALT are normal. -We will hold his chemotherapy today.  He will receive fluids.  I will reevaluate him in 1 week.  2.  Weight loss: -He lost 3 pounds since last visit 2 weeks ago. -He is drinking Ensure 3 cans/day, 150 cal each.  I have told him to drink Glucerna which has slightly more calories. -He reports solid food sometimes gets hung in the back of the throat. -He could not take Megace and took for 4 days and ended up throwing up.  I have told him to take nausea medicine 30 minutes prior to taking Megace.  3.  Right lateral chest wall pain: -He is taking oxycodone 15 mg 3 times a day.  Rarely he requires 4 times a day.  This is from right posterior rib lesion.  4.  Neuropathy: -He will continue gabapentin 900 mg twice daily.  5.  Anxiety: -Continue Klonopin 1 mg twice daily as needed.  6.  Hypomagnesemia: -Continue magnesium twice daily.  Magnesium today is 1.7.  7.  DVT and PE: -Continue Xarelto.  8.  Sleeping difficulty: -He is taking Remeron 7.5 mg daily.  I have told him to increase it to 15 mg daily.

## 2019-09-05 NOTE — Progress Notes (Signed)
Patient has been assessed, vital signs and labs have been reviewed by Dr. [name]. HOLD treatment today but give 1liter of fluids with electrolytes over 2 hours per Dr. [name].

## 2019-09-05 NOTE — Progress Notes (Signed)
Labs reviewed with MD. Will hold treatment per MD and give hydration fluids per orders.   Patient tolerated it well without problems. Vitals stable and discharged home from clinic via wheelchair. Follow up as scheduled.

## 2019-09-05 NOTE — Patient Instructions (Addendum)
[place] at [facility] Discharge Instructions  You were seen today by Dr. [name]. He went over your recent lab results. Start drinking 4-5 Ensure a day. Try getting the glucerna to have more calories. He will hold your treatment today and give you fluids.  He will see you back in 1 week for labs, treatment and follow up. Start taking 2 tablets of remeron at bedtime to help with sleep.    Thank you for choosing [place] at [facility] to provide your oncology and hematology care.  To afford each patient quality time with our provider, please arrive at least 15 minutes before your scheduled appointment time.   If you have a lab appointment with the [facility] please come in thru the  Main Entrance and check in at the main information desk  You need to re-schedule your appointment should you arrive 10 or more minutes late.  We strive to give you quality time with our providers, and arriving late affects you and other patients whose appointments are after yours.  Also, if you no show three or more times for appointments you may be dismissed from the clinic at the providers discretion.     Again, thank you for choosing [facility].  Our hope is that these requests will decrease the amount of time that you wait before being seen by our physicians.       _____________________________________________________________  Should you have questions after your visit to [facility], please contact our office at [phone] between the hours of 8:00 a.m. and 4:30 p.m.  Voicemails left after 4:00 p.m. will not be returned until the following business day.  For prescription refill requests, have your pharmacy contact our office and allow 72 hours.    Cancer Center Support Programs:   > Cancer Support Group  2nd Tuesday of the month 1pm-2pm, Journey Room

## 2019-09-06 MED ORDER — CLONAZEPAM 1 MG PO TABS: 1.0000 mg | ORAL_TABLET | Freq: Three times a day (TID) | ORAL | 0 refills | Status: AC | PRN

## 2019-09-07 ENCOUNTER — Encounter (HOSPITAL_COMMUNITY): Payer: Medicare Other

## 2019-09-12 NOTE — Progress Notes (Signed)

## 2019-09-13 ENCOUNTER — Other Ambulatory Visit: Payer: Self-pay

## 2019-09-13 ENCOUNTER — Inpatient Hospital Stay (HOSPITAL_COMMUNITY): Payer: Medicare Other

## 2019-09-13 ENCOUNTER — Inpatient Hospital Stay (HOSPITAL_BASED_OUTPATIENT_CLINIC_OR_DEPARTMENT_OTHER): Payer: Medicare Other | Admitting: Hematology

## 2019-09-13 VITALS — BP 138/68 | HR 113 | Temp 96.9°F | Resp 16

## 2019-09-13 DIAGNOSIS — C20 Malignant neoplasm of rectum: Secondary | ICD-10-CM

## 2019-09-13 DIAGNOSIS — Z5111 Encounter for antineoplastic chemotherapy: Secondary | ICD-10-CM | POA: Diagnosis not present

## 2019-09-13 DIAGNOSIS — Z79899 Other long term (current) drug therapy: Secondary | ICD-10-CM | POA: Diagnosis not present

## 2019-09-13 DIAGNOSIS — I700 Atherosclerosis of aorta: Secondary | ICD-10-CM | POA: Diagnosis not present

## 2019-09-13 DIAGNOSIS — C7801 Secondary malignant neoplasm of right lung: Secondary | ICD-10-CM | POA: Diagnosis not present

## 2019-09-13 LAB — CBC WITH DIFFERENTIAL/PLATELET
Abs Immature Granulocytes: 0.07 10*3/uL (ref 0.00–0.07)
Basophils Absolute: 0.1 10*3/uL (ref 0.0–0.1)
Basophils Relative: 0 %
Eosinophils Absolute: 0.2 10*3/uL (ref 0.0–0.5)
Eosinophils Relative: 1 %
HCT: 37.8 % — ABNORMAL LOW (ref 39.0–52.0)
Hemoglobin: 11.7 g/dL — ABNORMAL LOW (ref 13.0–17.0)
Immature Granulocytes: 1 %
Lymphocytes Relative: 12 %
Lymphs Abs: 1.7 10*3/uL (ref 0.7–4.0)
MCH: 27.3 pg (ref 26.0–34.0)
MCHC: 31 g/dL (ref 30.0–36.0)
MCV: 88.3 fL (ref 80.0–100.0)
Monocytes Absolute: 1.2 10*3/uL — ABNORMAL HIGH (ref 0.1–1.0)
Monocytes Relative: 8 %
Neutro Abs: 10.8 10*3/uL — ABNORMAL HIGH (ref 1.7–7.7)
Neutrophils Relative %: 78 %
Platelets: 272 10*3/uL (ref 150–400)
RBC: 4.28 MIL/uL (ref 4.22–5.81)
RDW: 15.4 % (ref 11.5–15.5)
WBC: 14 10*3/uL — ABNORMAL HIGH (ref 4.0–10.5)
nRBC: 0 % (ref 0.0–0.2)

## 2019-09-13 LAB — URINALYSIS, DIPSTICK ONLY
Bilirubin Urine: NEGATIVE
Glucose, UA: NEGATIVE mg/dL
Hgb urine dipstick: NEGATIVE
Ketones, ur: NEGATIVE mg/dL
Leukocytes,Ua: NEGATIVE
Nitrite: NEGATIVE
Protein, ur: 30 mg/dL — AB
Specific Gravity, Urine: 1.023 (ref 1.005–1.030)
pH: 5 (ref 5.0–8.0)

## 2019-09-13 LAB — COMPREHENSIVE METABOLIC PANEL
ALT: 16 U/L (ref 0–44)
AST: 20 U/L (ref 15–41)
Albumin: 3 g/dL — ABNORMAL LOW (ref 3.5–5.0)
Alkaline Phosphatase: 218 U/L — ABNORMAL HIGH (ref 38–126)
Anion gap: 8 (ref 5–15)
BUN: 10 mg/dL (ref 8–23)
CO2: 24 mmol/L (ref 22–32)
Calcium: 8.7 mg/dL — ABNORMAL LOW (ref 8.9–10.3)
Chloride: 103 mmol/L (ref 98–111)
Creatinine, Ser: 0.51 mg/dL — ABNORMAL LOW (ref 0.61–1.24)
GFR calc Af Amer: >60 mL/min (ref >60)
GFR calc non Af Amer: >60 mL/min (ref >60)
Glucose, Bld: 204 mg/dL — ABNORMAL HIGH (ref 70–99)
Potassium: 3.7 mmol/L (ref 3.5–5.1)
Sodium: 135 mmol/L (ref 135–145)
Total Bilirubin: 1 mg/dL (ref 0.3–1.2)
Total Protein: 6.3 g/dL — ABNORMAL LOW (ref 6.5–8.1)

## 2019-09-13 LAB — MAGNESIUM: Magnesium: 1.8 mg/dL (ref 1.7–2.4)

## 2019-09-13 MED ORDER — SODIUM CHLORIDE 0.9 % IV SOLN
1600.0000 mg/m2 | INTRAVENOUS | Status: DC
  Filled 2019-09-13: qty 63

## 2019-09-13 MED ORDER — FAMOTIDINE IN NACL 20-0.9 MG/50ML-% IV SOLN
20.0000 mg | Freq: Once | INTRAVENOUS | Status: AC | PRN
  Administered 2019-09-13: 20 mg via INTRAVENOUS

## 2019-09-13 MED ORDER — SODIUM CHLORIDE 0.9 % IV SOLN: Freq: Once | INTRAVENOUS | Status: DC

## 2019-09-13 MED ORDER — PROCHLORPERAZINE EDISYLATE 10 MG/2ML IJ SOLN
10.0000 mg | Freq: Once | INTRAMUSCULAR | Status: AC
  Administered 2019-09-13: 10 mg via INTRAVENOUS

## 2019-09-13 MED ORDER — MORPHINE SULFATE (PF) 2 MG/ML IV SOLN
INTRAVENOUS | Status: AC
  Filled 2019-09-13: qty 1

## 2019-09-13 MED ORDER — LEUCOVORIN CALCIUM INJECTION 350 MG
266.6000 mg/m2 | Freq: Once | INTRAVENOUS | Status: AC
  Administered 2019-09-13: 522 mg via INTRAVENOUS
  Filled 2019-09-13: qty 10

## 2019-09-13 MED ORDER — OXALIPLATIN CHEMO INJECTION 100 MG/20ML
56.6670 mg/m2 | Freq: Once | INTRAVENOUS | Status: AC
  Administered 2019-09-13: 110 mg via INTRAVENOUS
  Filled 2019-09-13: qty 22

## 2019-09-13 MED ORDER — SODIUM CHLORIDE 0.9% FLUSH
10.0000 mL | INTRAVENOUS | Status: DC | PRN
  Administered 2019-09-13: 10 mL

## 2019-09-13 MED ORDER — HEPARIN SOD (PORK) LOCK FLUSH 100 UNIT/ML IV SOLN
500.0000 [IU] | Freq: Once | INTRAVENOUS | Status: AC | PRN
  Administered 2019-09-13: 500 [IU]

## 2019-09-13 MED ORDER — DIPHENHYDRAMINE HCL 50 MG/ML IJ SOLN
25.0000 mg | Freq: Once | INTRAMUSCULAR | Status: AC
  Administered 2019-09-13: 25 mg via INTRAVENOUS

## 2019-09-13 MED ORDER — DIPHENHYDRAMINE HCL 50 MG/ML IJ SOLN
25.0000 mg | Freq: Once | INTRAMUSCULAR | Status: AC
  Administered 2019-09-13: 25 mg via INTRAVENOUS
  Filled 2019-09-13: qty 1

## 2019-09-13 MED ORDER — DEXTROSE 5 % IV SOLN: Freq: Once | INTRAVENOUS | Status: AC

## 2019-09-13 MED ORDER — PROCHLORPERAZINE EDISYLATE 10 MG/2ML IJ SOLN
INTRAMUSCULAR | Status: AC
  Filled 2019-09-13: qty 2

## 2019-09-13 MED ORDER — PALONOSETRON HCL INJECTION 0.25 MG/5ML
0.2500 mg | Freq: Once | INTRAVENOUS | Status: AC
  Administered 2019-09-13: 0.25 mg via INTRAVENOUS

## 2019-09-13 MED ORDER — FLUOROURACIL CHEMO INJECTION 500 MG/10ML
266.6700 mg/m2 | Freq: Once | INTRAVENOUS | Status: DC
  Filled 2019-09-13: qty 10

## 2019-09-13 MED ORDER — MORPHINE SULFATE (PF) 2 MG/ML IV SOLN
2.0000 mg | Freq: Once | INTRAVENOUS | Status: AC
  Administered 2019-09-13: 2 mg via INTRAVENOUS

## 2019-09-13 MED ORDER — SODIUM CHLORIDE 0.9 % IV SOLN
10.0000 mg | Freq: Once | INTRAVENOUS | Status: AC
  Administered 2019-09-13: 10 mg via INTRAVENOUS
  Filled 2019-09-13: qty 10

## 2019-09-13 MED ORDER — MORPHINE SULFATE (PF) 2 MG/ML IV SOLN
1.0000 mg | Freq: Once | INTRAVENOUS | Status: AC
  Administered 2019-09-13: 1 mg via INTRAVENOUS

## 2019-09-13 NOTE — Progress Notes (Signed)
Patient has been assessed, vital signs and labs have been reviewed by Dr. [name]. ANC, Creatinine, LFTs, and Platelets are within treatment parameters per Dr. [name]. The patient is good to proceed with treatment at this time. Treatment doses the same as last treatment per Dr. [name].

## 2019-09-13 NOTE — Progress Notes (Signed)
[place], [place] [address]   CLINIC:  Medical Oncology/Hematology  PCP:  [name], MD [place] [address] [phone]   REASON FOR VISIT:  Follow-up for Stage IV Colon Cancer  CURRENT THERAPY: FOLFOX  BRIEF ONCOLOGIC HISTORY:  Oncology History  Rectal cancer ([place])  07/24/2010 Initial Diagnosis   Invasive adenocarcinoma of rectum   09/09/2010 Concurrent Chemotherapy   S/P radiation and concurrent 5-FU continuous infusion from 09/09/10- 10/10/10.   11/14/2010 Surgery   S/P proctectomy with colorectal anastomosis and diverting loop ileostomy on 11/14/10 at [facility] by Dr. Harlon Ditty. Pathology reveals a pT3b N1 with 3/20 lymph nodes.   02/05/2011 - 07/14/2011 Chemotherapy   FOLFOX   08/18/2011 Surgery   Approximate date of surgery- [place] by Dr. Harlon Ditty    Remission     11/24/2013 Survivorship   Colonoscopy- somewhat fibrotic/friable anastomotic mucosal-status post biopsy (narrowing not felt to be clinically significant).  Negative pathology for malignancy   09/12/2014 Imaging   DVT in the left femoral venous system, left common iliac vein, IVC, and within the IVC filter Right lower extremity venography confirms chronic occlusion of the femoral venous system with collateralization. The right iliac venous system is patent and do   09/25/2014 PET scan   The right middle lobe pulmonary nodule is hypermetabolic, favored to represent a primary bronchogenic carcinoma.Equivocal mediastinal nodes, similar to surrounding blood pool. Bilateral adrenal hypermetabolism, felt to be physiologic   10/24/2014 Pathology Results   Lung, needle/core biopsy(ies), RML - ADENOCARCINOMA, SEE COMMENT metastatic adenocarcinoma of a colorectal primary   10/24/2014 Relapse/Recurrence     11/16/2014 Definitive Surgery   Bronchoscopy, right video-assisted thoracoscopy, wedge resection of right middle lobe by Dr. Servando Snare   11/16/2014 Pathology  Results   Lung, wedge biopsy/resection, right lingula and small portion of middle lobe - METASTATIC ADENOCARCINOMA, CONSISTENT WITH COLORECTAL PRIMARY, SPANNING 2.0 CM. - THE SURGICAL RESECTION MARGINS ARE NEGATIVE FOR ADENOCARCINOMA.   11/16/2014 Remission   THE SURGICAL RESECTION MARGINS ARE NEGATIVE FOR ADENOCARCINOMA.   03/07/2015 Imaging   CT CAP- Interval resection of right middle lobe metastasis. No acute process or evidence of metastatic disease in the chest, abdomen or pelvis. Improved right upper lobe reticular nodular opacities are favored to represent resolving infection.   09/14/2015 Imaging   CT CAP- No findings of recurrent malignancy. No recurrence along the wedge resection site of the right middle lobe. Right anterior abdominal wall focal hernia containing a knuckle of small bowel without complicating feature.   06/13/2016 Imaging   CT CAP- 1. New right-sided pleural metastasis/mass. Other smaller right-sided pulmonary nodules which are all pleural-based and most likely represent pleural metastasis. 2. No convincing evidence of abdominopelvic nodal metastasis. 3. Constellation of findings, including pancreatic atrophy, duct dilatation, and pancreatic head soft tissue fullness which are highly suspicious for pancreatic adenocarcinoma. Metastatic disease felt much less likely. Consider endoscopic ultrasound sampling or ERCP. Cannot exclude superimposed acute pancreatitis. 4. New enlargement of the appendiceal tip with subtle surrounding edema. Cannot exclude early or mild appendicitis. 5. Coronary artery atherosclerosis. Aortic atherosclerosis. 6. 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   06/13/2016 Progression   CT scan demonstrates progression of disease   06/19/2016 Procedure   EUS with FNA by Dr. Ardis Hughs   06/20/2016 Pathology Results   FINE NEEDLE ASPIRATION, ENDOSCOPIC, PANCREAS UNCINATE AREA(SPECIMEN 1 OF 1 COLLECTED 06/19/16): MALIGNANT  CELLS CONSISTENT WITH METASTATIC ADENOCARCINOMA.  06/25/2016 PET scan   1. Two pleural-based nodules in the right hemithorax are hypermetabolic. Metastatic disease is a distinct consideration. The scattered pulmonary parenchymal nodule seen on previous diagnostic CT imaging are below the threshold for reliable resolution on PET imaging. 2. Hypermetabolic lesion pancreatic head, consistent with neoplasm. As noted on prior CT, adenocarcinoma is any consideration. No evidence for hypermetabolic abdominal lymphadenopathy. Mottled uptake noted in the liver, but no discrete hepatic metastases are evident on PET imaging. 3. Appendix remains distended up to 0.9-10 mm diameter with a stone towards the tip in subtle periappendiceal edema/inflammation. The appendix is hypermetabolic along its length. Imaging features are relatively stable in the 12 day interval since prior CT scan. While appendicitis is a consideration, the relative stability over 12 days would be unusual for that etiology. Continued close follow-up recommended.   06/26/2016 Pathology Results   Not enough tissue for foundationONE or K-ras testing.   07/07/2016 Procedure   Port placed by Dr. Arnoldo Morale   07/08/2016 -  Chemotherapy   The patient had pegfilgrastim (NEULASTA) injection 6 mg, 6 mg, Subcutaneous,  Once, 0 of 4 cycles  ondansetron (ZOFRAN) IVPB 8 mg, 8 mg, Intravenous,  Once, 0 of 4 cycles  leucovorin 804 mg in dextrose 5 % 250 mL infusion, 400 mg/m2, Intravenous,  Once, 0 of 4 cycles  oxaliplatin (ELOXATIN) 170 mg in dextrose 5 % 500 mL chemo infusion, 85 mg/m2, Intravenous,  Once, 0 of 4 cycles  fluorouracil (ADRUCIL) 4,800 mg in sodium chloride 0.9 % 150 mL chemo infusion, 2,400 mg/m2 = 4,800 mg, Intravenous, 1 Day/Dose, 0 of 4 cycles  fluorouracil (ADRUCIL) chemo injection 800 mg, 400 mg/m2, Intravenous,  Once, 0 of 4 cycles  pegfilgrastim (NEULASTA) injection 6 mg, 6 mg, Subcutaneous,  Once, 2 of 2  cycles  ondansetron (ZOFRAN) IVPB 8 mg, 8 mg, Intravenous,  Once, 12 of 12 cycles  leucovorin 660 mg in dextrose 5 % 250 mL infusion, 660 mg (original dose ), Intravenous,  Once, 12 of 12 cycles Dose modification: 660 mg (Cycle 1)  oxaliplatin (ELOXATIN) 140 mg in dextrose 5 % 500 mL chemo infusion, 140 mg (original dose ), Intravenous,  Once, 12 of 12 cycles Dose modification: 140 mg (Cycle 1), 119 mg (85 % of original dose 140 mg, Cycle 8, Reason: Provider Judgment)  fluorouracil (ADRUCIL) 3,850 mg in sodium chloride 0.9 % 150 mL chemo infusion, 3,850 mg (original dose ), Intravenous, 1 Day/Dose, 12 of 12 cycles Dose modification: 3,850 mg (Cycle 1)  fluorouracil (ADRUCIL) chemo injection 700 mg, 700 mg (original dose ), Intravenous,  Once, 12 of 12 cycles Dose modification: 700 mg (Cycle 1)  palonosetron (ALOXI) injection 0.25 mg, 0.25 mg, Intravenous,  Once, 7 of 12 cycles Administration: 0.25 mg (09/30/2016)  bevacizumab (AVASTIN) 475 mg in sodium chloride 0.9 % 100 mL chemo infusion, 5 mg/kg = 475 mg, Intravenous,  Once, 6 of 11 cycles Administration: 500 mg (09/30/2016)  leucovorin 880 mg in dextrose 5 % 250 mL infusion, 400 mg/m2 = 880 mg, Intravenous,  Once, 7 of 12 cycles Administration: 880 mg (09/30/2016)  oxaliplatin (ELOXATIN) 185 mg in dextrose 5 % 500 mL chemo infusion, 85 mg/m2 = 185 mg, Intravenous,  Once, 7 of 12 cycles Administration: 185 mg (09/30/2016)  fluorouracil (ADRUCIL) chemo injection 900 mg, 400 mg/m2 = 900 mg, Intravenous,  Once, 7 of 12 cycles Administration: 900 mg (09/30/2016)  fluorouracil (ADRUCIL) 5,300 mg in sodium chloride 0.9 % 144 mL chemo infusion, 2,400 mg/m2 = 5,300  mg, Intravenous, 1 Day/Dose, 7 of 12 cycles Administration: 5,300 mg (09/30/2016)  for chemotherapy treatment.     08/27/2016 Imaging   CT CAP- 1. Pulmonary metastatic disease is stable. 2. Pancreatic head mass, grossly stable, with progressive atrophy of the body and tail of the  pancreas. Stable peripancreatic lymph node. 3. Mild circumferential rectal wall thickening, stable. 4. Aortic atherosclerosis (ICD10-170.0). Coronary artery calcification. 5. Hyperattenuating lesion off the lower pole left kidney, stable, too small to characterize. Continued attention on followup exams is warranted. 6. Persistent wall thickening and mild dilatation of the proximal appendix, of uncertain etiology. Continued attention on followup exams is warranted.   11/06/2016 Imaging   CT C/A/P: IMPRESSION: 1. Stable pulmonary metastatic disease. 2. Similar appearing pancreatic head mass. Progressed atrophy of the pancreatic body and tail. Stable peripancreatic lymph node. 3. Stable hypoattenuating lesion partially exophytic off the inferior pole of the left kidney. 4. Aortic atherosclerosis. 5. Persistent mild wall thickening and mild dilatation of the appendix.    01/30/2017 Imaging   Ct C/A/P: IMPRESSION: 1. Stable pulmonary metastatic disease. 2. Stable to slight increase in size of pancreatic head mass. 3.  Aortic Atherosclerosis (ICD10-I70.0). 4. Similar appearance of mild wall thickening of the appendix which contains an appendicolith.   05/01/2017 Imaging   CT C/A/P: Stable disease in the lungs, no progression of disease in the abdomen.   10/04/2018 - 07/26/2019 Chemotherapy   The patient had palonosetron (ALOXI) injection 0.25 mg, 0.25 mg, Intravenous,  Once, 12 of 14 cycles Administration: 0.25 mg (10/04/2018), 0.25 mg (10/25/2018), 0.25 mg (12/01/2018), 0.25 mg (12/15/2018), 0.25 mg (12/29/2018), 0.25 mg (01/12/2019), 0.25 mg (01/26/2019), 0.25 mg (02/09/2019), 0.25 mg (05/11/2019), 0.25 mg (05/25/2019), 0.25 mg (06/08/2019), 0.25 mg (06/22/2019) pegfilgrastim (NEULASTA) injection 6 mg, 6 mg, Subcutaneous, Once, 2 of 2 cycles Administration: 6 mg (10/27/2018) pegfilgrastim-cbqv (UDENYCA) injection 6 mg, 6 mg, Subcutaneous, Once, 10 of 12 cycles Administration: 6 mg (12/03/2018), 6 mg  (12/17/2018), 6 mg (12/31/2018), 6 mg (01/14/2019), 6 mg (01/28/2019), 6 mg (02/11/2019), 6 mg (05/13/2019), 6 mg (05/27/2019), 6 mg (06/10/2019), 6 mg (06/24/2019) bevacizumab (AVASTIN) 450 mg in sodium chloride 0.9 % 100 mL chemo infusion, 5 mg/kg = 450 mg, Intravenous,  Once, 4 of 4 cycles Administration: 400 mg (12/01/2018), 400 mg (12/15/2018), 400 mg (12/29/2018) irinotecan (CAMPTOSAR) 380 mg in sodium chloride 0.9 % 500 mL chemo infusion, 180 mg/m2 = 380 mg, Intravenous,  Once, 12 of 14 cycles Dose modification: 144 mg/m2 (original dose 180 mg/m2, Cycle 2, Reason: Provider Judgment, Comment: 20% dose reduction), 90 mg/m2 (50 % of original dose 180 mg/m2, Cycle 3, Reason: Other (see comments), Comment: [id number]) Administration: 380 mg (10/04/2018), 300 mg (10/25/2018), 200 mg (12/01/2018), 200 mg (12/15/2018), 200 mg (12/29/2018), 200 mg (01/12/2019), 200 mg (01/26/2019), 200 mg (02/09/2019), 180 mg (05/11/2019), 180 mg (05/25/2019), 180 mg (06/08/2019), 180 mg (06/22/2019) leucovorin 900 mg in sodium chloride 0.9 % 250 mL infusion, 421 mg/m2 = 856 mg, Intravenous,  Once, 12 of 14 cycles Dose modification: 320 mg/m2 (original dose 400 mg/m2, Cycle 2, Reason: Provider Judgment, Comment: 20% dose reduction) Administration: 900 mg (10/04/2018), 700 mg (10/25/2018), 700 mg (12/01/2018), 700 mg (12/15/2018), 700 mg (12/29/2018), 700 mg (01/12/2019), 700 mg (01/26/2019), 700 mg (02/09/2019), 700 mg (05/11/2019), 700 mg (05/25/2019), 700 mg (06/08/2019), 700 mg (06/22/2019) fluorouracil (ADRUCIL) chemo injection 850 mg, 400 mg/m2 = 850 mg, Intravenous,  Once, 12 of 14 cycles Dose modification: 320 mg/m2 (original dose 400 mg/m2, Cycle 2, Reason: Provider Judgment, Comment: 20%  dose reduction) Administration: 850 mg (10/04/2018), 700 mg (10/25/2018), 700 mg (12/01/2018), 700 mg (12/15/2018), 700 mg (12/29/2018), 700 mg (01/12/2019), 700 mg (01/26/2019), 700 mg (02/09/2019), 700 mg (05/11/2019), 700 mg (05/25/2019), 700 mg (06/08/2019), 700 mg (06/22/2019) fosaprepitant  (EMEND) 150 mg, dexamethasone (DECADRON) 12 mg in sodium chloride 0.9 % 145 mL IVPB, , Intravenous,  Once, 10 of 12 cycles Administration:  (12/01/2018),  (12/15/2018),  (12/29/2018),  (01/12/2019),  (01/26/2019),  (02/09/2019),  (05/11/2019),  (05/25/2019),  (06/08/2019),  (06/22/2019) fluorouracil (ADRUCIL) 5,000 mg in sodium chloride 0.9 % 150 mL chemo infusion, 2,325 mg/m2 = 5,150 mg, Intravenous, 1 Day/Dose, 12 of 14 cycles Dose modification: 1,920 mg/m2 (original dose 2,400 mg/m2, Cycle 2, Reason: Provider Judgment, Comment: 20% dose reduction) Administration: 5,000 mg (10/04/2018), 4,100 mg (10/25/2018), 4,100 mg (12/01/2018), 4,100 mg (12/15/2018), 4,100 mg (12/29/2018), 4,100 mg (01/12/2019), 4,100 mg (01/26/2019), 4,100 mg (02/09/2019), 4,100 mg (05/11/2019), 4,100 mg (05/25/2019), 4,100 mg (06/08/2019), 4,100 mg (06/22/2019) bevacizumab-bvzr (ZIRABEV) 400 mg in sodium chloride 0.9 % 100 mL chemo infusion, 5 mg/kg = 400 mg (100 % of original dose 5 mg/kg), Intravenous,  Once, 4 of 6 cycles Dose modification: 5 mg/kg (original dose 5 mg/kg, Cycle 6) Administration: 400 mg (01/12/2019), 400 mg (01/26/2019), 400 mg (02/09/2019)  for chemotherapy treatment.    08/01/2019 -  Chemotherapy   The patient had palonosetron (ALOXI) injection 0.25 mg, 0.25 mg, Intravenous,  Once, 3 of 4 cycles Administration: 0.25 mg (08/01/2019), 0.25 mg (09/13/2019), 0.25 mg (08/22/2019) pegfilgrastim-cbqv (UDENYCA) injection 6 mg, 6 mg, Subcutaneous, Once, 3 of 4 cycles Administration: 6 mg (08/03/2019), 6 mg (08/24/2019) leucovorin 628 mg in dextrose 5 % 250 mL infusion, 320 mg/m2 = 628 mg (80 % of original dose 400 mg/m2), Intravenous,  Once, 3 of 4 cycles Dose modification: 320 mg/m2 (80 % of original dose 400 mg/m2, Cycle 1, Reason: Change in LFTs), 280 mg/m2 (70 % of original dose 400 mg/m2, Cycle 3, Reason: Provider Judgment), 478.2956 mg/m2 (66.7 % of original dose 400 mg/m2, Cycle 2, Reason: Provider Judgment) Administration: 628 mg (08/01/2019),  522 mg (09/13/2019), 522 mg (08/22/2019) oxaliplatin (ELOXATIN) 135 mg in dextrose 5 % 500 mL chemo infusion, 68 mg/m2 = 135 mg (80 % of original dose 85 mg/m2), Intravenous,  Once, 3 of 4 cycles Dose modification: 68 mg/m2 (80 % of original dose 85 mg/m2, Cycle 1, Reason: Change in LFTs), 59.5 mg/m2 (70 % of original dose 85 mg/m2, Cycle 3, Reason: Provider Judgment), 21.3086 mg/m2 (66.7 % of original dose 85 mg/m2, Cycle 2, Reason: Provider Judgment) Administration: 135 mg (08/01/2019), 110 mg (09/13/2019), 110 mg (08/22/2019) fluorouracil (ADRUCIL) chemo injection 650 mg, 320 mg/m2 = 650 mg (80 % of original dose 400 mg/m2), Intravenous,  Once, 3 of 4 cycles Dose modification: 320 mg/m2 (80 % of original dose 400 mg/m2, Cycle 1, Reason: Change in LFTs), 280 mg/m2 (70 % of original dose 400 mg/m2, Cycle 3, Reason: Provider Judgment), 578.4696 mg/m2 (66.7 % of original dose 400 mg/m2, Cycle 2, Reason: Provider Judgment) Administration: 650 mg (08/01/2019), 500 mg (08/22/2019) fluorouracil (ADRUCIL) 3,750 mg in sodium chloride 0.9 % 75 mL chemo infusion, 1,920 mg/m2 = 3,750 mg (80 % of original dose 2,400 mg/m2), Intravenous, 1 Day/Dose, 3 of 4 cycles Dose modification: 1,920 mg/m2 (80 % of original dose 2,400 mg/m2, Cycle 1, Reason: Change in LFTs), 1,680 mg/m2 (70 % of original dose 2,400 mg/m2, Cycle 3, Reason: Provider Judgment), 1,600 mg/m2 (66.7 % of original dose 2,400 mg/m2, Cycle 2, Reason: Provider  Judgment) Administration: 3,750 mg (08/01/2019), 3,150 mg (08/22/2019) bevacizumab-bvzr (ZIRABEV) 400 mg in sodium chloride 0.9 % 100 mL chemo infusion, 5 mg/kg = 400 mg, Intravenous,  Once, 1 of 2 cycles  for chemotherapy treatment.       CANCER STAGING: Cancer Staging Rectal cancer ([place]) Staging form: Colon and Rectum, AJCC 7th Edition - Clinical: Stage IIIB (T3, N1, M0) - Signed by [name], MD on 02/04/2011 - Pathologic stage from 11/17/2014: Stage IVA (M1a) - Signed by Baird Cancer,  PA-C on 09/14/2015    INTERVAL HISTORY:  Mr. Verrette 69 y.o. male seen for follow-up of metastatic rectal cancer and toxicity assessment prior to next cycle of chemotherapy.  He continues to lose weight.  He lost 3 pounds in the last 1 week.  He is very weak and sleeping a lot.  He is drinking Ensure 5 cans/day.  Each can has to 20 cal.  He started taking Megace and has not thrown up since then.  He has occasional dizziness.  He has dry heaves but denied any vomiting.  REVIEW OF SYSTEMS:  Review of Systems  Constitutional: Positive for fatigue.  Respiratory: Positive for shortness of breath.   Cardiovascular: Positive for chest pain and leg swelling.  Gastrointestinal: Positive for nausea.  Neurological: Positive for dizziness.  All other systems reviewed and are negative.    PAST MEDICAL/SURGICAL HISTORY:  Past Medical History:  Diagnosis Date  . Chronic anticoagulation   . Colon cancer ([place]) 07/24/2010   rectal ca, inv adenocarcinoma  . Depression 04/01/2011  . Diabetes mellitus without complication ([place])   . DVT (deep venous thrombosis) ([place]) 05/09/2011  . GERD (gastroesophageal reflux disease)   . High output ileostomy ([place]) 05/21/2011  . History of kidney stones   . HTN (hypertension)   . Hx of radiation therapy 09/02/10 to 10/14/10   pelvis  . Lung metastasis ([place])   . Neuropathy   . Peripheral vascular disease ([place])    dvt's,pe  . Pneumonia    hx x3  . Pulmonary embolism ([place])   . Rectal cancer ([place]) 01/15/2011   S/P radiation and concurrent 5-FU continuous infusion from 09/09/10- 10/10/10.  S/P proctectomy with colorectal anastomosis and diverting loop ileostomy on 11/14/10 at [facility] by Dr. Harlon Ditty. Pathology reveals a pT3b N1 with 3/20 lymph nodes.     Past Surgical History:  Procedure Laterality Date  . BILIARY STENT PLACEMENT N/A 09/16/2018   Procedure: STENT PLACEMENT;  Surgeon: Rogene Houston, MD;  [facility];  Service: Endoscopy;  Laterality:  N/A;  . BILIARY STENT PLACEMENT N/A 07/18/2019   Procedure: BILIARY STENT PLACEMENT;  Surgeon: Rogene Houston, MD;  [facility];  Service: Endoscopy;  Laterality: N/A;  . BIOPSY  09/24/2018   Procedure: BIOPSY;  Surgeon: Danie Binder, MD;  [facility];  Service: Endoscopy;;  . COLON SURGERY  11/14/2010   proctectomy with colorectal anastomosis and diverting loop ileostomy (temporary planned)  . COLONOSCOPY  07/2010   proximal rectal apple core mass 10-14cm from anal verge (adenocarcinoma), 2-3cm distal rectal carpet polyp s/p piecemeal snare polypectomy (adenoma)  . COLONOSCOPY  04/21/2012   RMR: Friable,fibrotic appearing colorectal anastomosis producing some luminal narrowing-not felt to be critical. path: focal erosion with slight inflammation and hyperemia. SURVEILLANCE DUE DEC 2015  . COLONOSCOPY N/A 11/24/2013   Dr. Rourk:somewhat fibrotic/friable anastomotic mucosal-status post biopsy (narrowing not felt to be clinically significant) Single colonic diverticulum. benign polypoid rectal mucosa  .  colostomy reversal  april 2013  . ERCP N/A 09/16/2018   Procedure: ENDOSCOPIC RETROGRADE CHOLANGIOPANCREATOGRAPHY (ERCP);  Surgeon: Rogene Houston, MD;  [facility];  Service: Endoscopy;  Laterality: N/A;  . ERCP N/A 07/18/2019   Procedure: ENDOSCOPIC RETROGRADE CHOLANGIOPANCREATOGRAPHY (ERCP);  Surgeon: Rogene Houston, MD;  [facility];  Service: Endoscopy;  Laterality: N/A;  . ESOPHAGOGASTRODUODENOSCOPY  07/2010   RMR: schatki ring s/p dilation, small hh, SB bx benign  . ESOPHAGOGASTRODUODENOSCOPY N/A 09/04/2015   Procedure: ESOPHAGOGASTRODUODENOSCOPY (EGD);  Surgeon: Daneil Dolin, MD;  [facility];  Service: Endoscopy;  Laterality: N/A;  730  . EUS  08/2010   Dr. Owens Loffler. uT3N0 circumferential, nearly obstruction rectosigmoid adenocarcinoma, distal edge 12cm from anal verge  . EUS N/A 06/19/2016   Procedure: UPPER ENDOSCOPIC  ULTRASOUND (EUS) RADIAL;  Surgeon: Milus Banister, MD;  [facility]: [facility];  Service: Endoscopy;  Laterality: N/A;  . FLEXIBLE SIGMOIDOSCOPY N/A 09/24/2018   Procedure: FLEXIBLE SIGMOIDOSCOPY;  Surgeon: Danie Binder, MD;  [facility];  Service: Endoscopy;  Laterality: N/A;  . HERNIA REPAIR     abd hernia repair  . IVC filter    . ivc filter    . port a cath placement    . PORT-A-CATH REMOVAL  09/24/2011   Procedure: REMOVAL PORT-A-CATH;  Surgeon: Donato Heinz, MD;  Location: [facility];  Service: General;  Laterality: N/A;  Minor Room  . PORTACATH PLACEMENT Right 07/07/2016   Procedure: INSERTION PORT-A-CATH;  Surgeon: Aviva Signs, MD;  Location: [facility];  Service: General;  Laterality: Right;  . SPHINCTEROTOMY N/A 09/16/2018   Procedure: SPHINCTEROTOMY with balloon dialation;  Surgeon: Rogene Houston, MD;  [facility];  Service: Endoscopy;  Laterality: N/A;  . TRANSVERSE LOOP COLOSTOMY N/A 09/29/2018   Procedure: TRANSVERSE LOOP COLOSTOMY;  Surgeon: Aviva Signs, MD;  Location: [facility];  Service: General;  Laterality: N/A;  . VIDEO ASSISTED THORACOSCOPY (VATS)/WEDGE RESECTION Right 11/15/2014   Procedure: VIDEO ASSISTED THORACOSCOPY (VATS)/LUNG RESECTION WITH RIGHT LINGULECTOMY;  Surgeon: [name], MD;  Location: [facility];  Service: Thoracic;  Laterality: Right;  Marland Kitchen VIDEO BRONCHOSCOPY N/A 11/15/2014   Procedure: VIDEO BRONCHOSCOPY;  Surgeon: [name], MD;  Location: [facility] OR;  Service: Thoracic;  Laterality: N/A;     SOCIAL HISTORY:  Social History   Socioeconomic History  . Marital status: Married    Spouse name: Not on file  . Number of children: 2  . Years of education: Not on file  . Highest education level: Not on file  Occupational History  . Occupation: self-employed Regulatory affairs officer, currently not working    Fish farm manager: SELF EMPLOYED  Tobacco Use  . Smoking status: Never Smoker  . Smokeless tobacco: Never Used  Substance and  Sexual Activity  . Alcohol use: No  . Drug use: No  . Sexual activity: Yes    Birth control/protection: None    Comment: married  Other Topics Concern  . Not on file  Social History Narrative  . Not on file   Social Determinants of Health   Financial Resource Strain: Low Risk   . Difficulty of Paying Living Expenses: Not very hard  Food Insecurity: No Food Insecurity  . Worried About Charity fundraiser in the Last Year: Never true  . Ran Out of Food in the Last Year: Never true  Transportation Needs: No Transportation Needs  . Lack of Transportation (Medical): No  . Lack of  Transportation (Non-Medical): No  Physical Activity: Inactive  . Days of Exercise per Week: 0 days  . Minutes of Exercise per Session: 0 min  Stress: No Stress Concern Present  . Feeling of Stress : Only a little  Social Connections: Unknown  . Frequency of Communication with Friends and Family: More than three times a week  . Frequency of Social Gatherings with Friends and Family: Once a week  . Attends Religious Services: Patient refused  . Active Member of Clubs or Organizations: Patient refused  . Attends Archivist Meetings: Patient refused  . Marital Status: Patient refused  Intimate Partner Violence: Unknown  . Fear of Current or Ex-Partner: Patient refused  . Emotionally Abused: Patient refused  . Physically Abused: Patient refused  . Sexually Abused: Patient refused    FAMILY HISTORY:  Family History  Problem Relation Age of Onset  . Cancer Brother        throat  . Cancer Brother        prostate  . Colon cancer Neg Hx   . Liver disease Neg Hx   . Inflammatory bowel disease Neg Hx     CURRENT MEDICATIONS:  Outpatient Encounter Medications as of 09/13/2019  Medication Sig  . gabapentin (NEURONTIN) 300 MG capsule Take 3 capsules (900 mg total) by mouth 2 (two) times daily.  Marland Kitchen glimepiride (AMARYL) 2 MG tablet Take 2 mg by mouth 2 (two) times daily before a meal.   . magic  mouthwash w/lidocaine SOLN Take 15 mLs by mouth in the morning and at bedtime.  . magnesium oxide (MAG-OX) 400 (241.3 Mg) MG tablet Take 1 tablet (400 mg total) by mouth 3 (three) times daily.  . megestrol (MEGACE) 400 MG/10ML suspension Take 10 mLs (400 mg total) by mouth 2 (two) times daily.  . mirtazapine (REMERON) 15 MG tablet Take 1 tablet (15 mg total) by mouth at bedtime.  . pantoprazole (PROTONIX) 40 MG tablet Take 40 mg by mouth every evening.   . Probiotic Product (PROBIOTIC DAILY PO) Take 1 capsule by mouth 2 (two) times daily.   . rivaroxaban (XARELTO) 20 MG TABS tablet Take 1 tablet (20 mg total) by mouth daily with supper.  . vitamin B-12 (CYANOCOBALAMIN) 1000 MCG tablet Take 1,000 mcg by mouth daily.  Marland Kitchen acetaminophen (TYLENOL) 500 MG tablet Take 1,000 mg by mouth every 6 (six) hours as needed for mild pain or moderate pain.   . clonazePAM (KLONOPIN) 1 MG tablet Take 1 tablet (1 mg total) by mouth 3 (three) times daily as needed. (Patient not taking: Reported on 09/13/2019)  . oxyCODONE (ROXICODONE) 15 MG immediate release tablet Take 1 tablet (15 mg total) by mouth every 4 (four) hours as needed. for pain (Patient not taking: Reported on 09/13/2019)  . prochlorperazine (COMPAZINE) 10 MG tablet Take 1 tablet (10 mg total) by mouth every 6 (six) hours as needed for nausea or vomiting. (Patient not taking: Reported on 09/13/2019)   Facility-Administered Encounter Medications as of 09/13/2019  Medication  . sodium chloride 0.9 % injection 10 mL  . sodium chloride flush (NS) 0.9 % injection 10 mL    ALLERGIES:  Allergies  Allergen Reactions  . Oxaliplatin Other (See Comments)    Chest pain  . Oxycodone     Blisters, hallucinations (takes Percocet at home)  . Tramadol     Blisters, hallucinations  . Trazodone And Nefazodone Other (See Comments)    hallucinations     PHYSICAL EXAM:  ECOG Performance status: 1  Vitals:   09/13/19 0918  BP: 133/79  Pulse: (!) 115  Resp: 18    Temp: (!) 97.1 F (36.2 C)  SpO2: 100%   Filed Weights   09/13/19 0918  Weight: 148 lb 11.2 oz (67.4 kg)    Physical Exam Vitals reviewed.  Constitutional:      Appearance: Normal appearance.  Eyes:     Extraocular Movements: Extraocular movements intact.     Conjunctiva/sclera: Conjunctivae normal.  Cardiovascular:     Rate and Rhythm: Regular rhythm. Tachycardia present.     Pulses: Normal pulses.     Heart sounds: Normal heart sounds.  Pulmonary:     Effort: Pulmonary effort is normal.     Breath sounds: Normal breath sounds.  Abdominal:     General: Abdomen is flat.     Palpations: Abdomen is soft.  Musculoskeletal:        General: Normal range of motion.     Cervical back: Normal range of motion.  Skin:    General: Skin is warm and dry.  Neurological:     General: No focal deficit present.     Mental Status: He is alert and oriented to person, place, and time.  Psychiatric:        Mood and Affect: Mood normal.        Behavior: Behavior normal.        Thought Content: Thought content normal.        Judgment: Judgment normal.      LABORATORY DATA:  I have reviewed the labs as listed.  CBC    Component Value Date/Time   WBC 14.0 (H) 09/13/2019 0933   RBC 4.28 09/13/2019 0933   HGB 11.7 (L) 09/13/2019 0933   HGB 11.6 (L) 02/04/2011 1059   HCT 37.8 (L) 09/13/2019 0933   HCT 34.7 (L) 02/04/2011 1059   PLT 272 09/13/2019 0933   PLT 282 02/04/2011 1059   MCV 88.3 09/13/2019 0933   MCV 76.0 (L) 02/04/2011 1059   MCH 27.3 09/13/2019 0933   MCHC 31.0 09/13/2019 0933   RDW 15.4 09/13/2019 0933   RDW 18.5 (H) 02/04/2011 1059   LYMPHSABS 1.7 09/13/2019 0933   LYMPHSABS 1.1 02/04/2011 1059   MONOABS 1.2 (H) 09/13/2019 0933   MONOABS 0.5 02/04/2011 1059   EOSABS 0.2 09/13/2019 0933   EOSABS 0.1 02/04/2011 1059   BASOSABS 0.1 09/13/2019 0933   BASOSABS 0.1 02/04/2011 1059   CMP Latest Ref Rng & Units 09/13/2019 09/05/2019 08/22/2019  Glucose 70 - 99 mg/dL  204(H) 188(H) 227(H)  BUN 8 - 23 mg/dL '10 9 12  '$ Creatinine 0.61 - 1.24 mg/dL 0.51(L) 0.59(L) 0.64  Sodium 135 - 145 mmol/L 135 134(L) 136  Potassium 3.5 - 5.1 mmol/L 3.7 3.5 3.3(L)  Chloride 98 - 111 mmol/L 103 100 98  CO2 22 - 32 mmol/L '24 24 26  '$ Calcium 8.9 - 10.3 mg/dL 8.7(L) 8.6(L) 8.6(L)  Total Protein 6.5 - 8.1 g/dL 6.3(L) 6.2(L) 6.1(L)  Total Bilirubin 0.3 - 1.2 mg/dL 1.0 1.1 0.9  Alkaline Phos 38 - 126 U/L 218(H) 232(H) 319(H)  AST 15 - 41 U/L '20 19 21  '$ ALT 0 - 44 U/L '16 16 16    '$ I have reviewed scans.   ASSESSMENT & PLAN:   Rectal cancer ([place]) 1.  Stage IV rectal cancer with lung and pancreatic metastasis: -2 cycles of FOLFOX with 20 and 30% dose reduction respectively on 08/01/2019 on 08/12/2019. -He is continuing to feel weak.  I have  reviewed labs.  CEA has improved to 37.5 from 46 prior to cycle 1. -We had a prolonged discussion about discontinuing chemotherapy and pursuing best supportive care option.  He wanted to try one last time to see if he can tolerate it.  2.  Weight loss: -He lost about 3 pounds in the last 1 week and 6 pounds in the last 3 weeks. -He is drinking Ensure for diabetics about 5 cans/day, each can has 220 cal.  He is eating very little. -He started taking Megace 10 mL twice daily within the last 1 week.  He has not seen any improvement in appetite yet.  We will continue Megace at this time.  3.  Right lateral chest wall pain: -Continue oxycodone 15 mg 3-4 times a day.  4.  Neuropathy: -Continue gabapentin 900 mg twice daily.  5.  Anxiety: -Continue Klonopin 1 mg twice daily as needed.  7.  Hypomagnesemia: -Continue magnesium twice daily.  Magnesium is normal.  8.  DVT and PE: -Continue Xarelto.  No bleeding issues.  9.  Sleeping difficulty: -Remeron was increased to 15 mg daily.  Is sleeping well.    Orders placed this encounter:  No orders of the defined types were placed in this encounter.  Addendum: -Less than 10 minutes into  the oxaliplatin infusion, he developed chest pain rated as 10 out of 10.  His blood pressure and saturations remained stable.  We have immediately stopped oxaliplatin infusion.  He received 25 mg of Benadryl as premedication prior to oxaliplatin.  We have given another dose of Benadryl, Pepcid and Solu-Medrol.  As the chest pain did not subside, we have given morphine 2 mg IV.  Chest pain improved to 6 out of 10.  He felt slightly lightheaded.  We have given another dose of morphine 1 mg IV.  Finally his chest pain subsided.  He did not want to receive any further treatment today.  He will be reevaluated again in 1 week. Total time spent is 40 minutes with more than 50% of the time spent face-to-face discussing treatment plan, managing adverse allergic reaction to the medication, counseling and coordination of care.  Derek Jack, MD  [place] [phone]

## 2019-09-13 NOTE — Progress Notes (Signed)
Labs reviewed with MD today. Will proceed as planned , same does as last treatment per MD.   1218-patient rang call bell, stated he was hurting in his chest. Rated his pain at a 10 on the pain scale. Chemotherapy immediately stopped. Hung a new bag of normal saline and new tubing. Notified MD, vitals obtained and oxygen applied via nasal canula at 2 liters. Gave pepcid, benadryl and morphine as verbally ordered. Vitals obtained again at 1223 and 1226, all were stable.   1223-patient stated his pain at 8.  1229 patient stated his pain at  6. 1231 vitals obtained, stable at this time.   1237-will give 1 mg of morphine per MD verbal order. Will continue to monitor patient .   1259-patient complaining of nausea, will give compazine per orders per MD.   1345-vitals obtained and stable. Patient feels some better,still little nauseated but ready to go home. Per MD no other treatment to be given today. MD ok'd discharge.   Vitals stable and discharged home from clinic via wheelchair. Follow up as scheduled.

## 2019-09-13 NOTE — Patient Instructions (Signed)
[facility] at [facility]  Discharge Instructions:   _______________________________________________________________  Thank you for choosing [facility] at [facility] to provide your oncology and hematology care.  To afford each patient quality time with our providers, please arrive at least 15 minutes before your scheduled appointment.  You need to re-schedule your appointment if you arrive 10 or more minutes late.  We strive to give you quality time with our providers, and arriving late affects you and other patients whose appointments are after yours.  Also, if you no show three or more times for appointments you may be dismissed from the clinic.  Again, thank you for choosing [facility] at [facility]. Our hope is that these requests will allow you access to exceptional care and in a timely manner. _______________________________________________________________  If you have questions after your visit, please contact our office at [phone] between the hours of 8:30 a.m. and 5:00 p.m. Voicemails left after 4:30 p.m. will not be returned until the following business day. _______________________________________________________________  For prescription refill requests, have your pharmacy contact our office. _______________________________________________________________  Recommendations made by the consultant and any test results will be sent to your referring physician. _______________________________________________________________ 

## 2019-09-13 NOTE — Patient Instructions (Addendum)
[place] at [facility] Discharge Instructions  You were seen today by Dr. [name]. He went over your recent lab results. He discussed stopping your treatment due to your weight loss and decreased appetite. He will see you back in 2 weeks for labs and follow up.   Thank you for choosing [place] at [facility] to provide your oncology and hematology care.  To afford each patient quality time with our provider, please arrive at least 15 minutes before your scheduled appointment time.   If you have a lab appointment with the [facility] please come in thru the  Main Entrance and check in at the main information desk  You need to re-schedule your appointment should you arrive 10 or more minutes late.  We strive to give you quality time with our providers, and arriving late affects you and other patients whose appointments are after yours.  Also, if you no show three or more times for appointments you may be dismissed from the clinic at the providers discretion.     Again, thank you for choosing [facility].  Our hope is that these requests will decrease the amount of time that you wait before being seen by our physicians.       _____________________________________________________________  Should you have questions after your visit to [facility], please contact our office at [phone] between the hours of 8:00 a.m. and 4:30 p.m.  Voicemails left after 4:00 p.m. will not be returned until the following business day.  For prescription refill requests, have your pharmacy contact our office and allow 72 hours.    Cancer Center Support Programs:   > Cancer Support Group  2nd Tuesday of the month 1pm-2pm, Journey Room

## 2019-09-13 NOTE — Assessment & Plan Note (Signed)
1.  Stage IV rectal cancer with lung and pancreatic metastasis: -2 cycles of FOLFOX with 20 and 30% dose reduction respectively on 08/01/2019 on 08/12/2019. -He is continuing to feel weak.  I have reviewed labs.  CEA has improved to 37.5 from 46 prior to cycle 1. -We had a prolonged discussion about discontinuing chemotherapy and pursuing best supportive care option.  He wanted to try one last time to see if he can tolerate it.  2.  Weight loss: -He lost about 3 pounds in the last 1 week and 6 pounds in the last 3 weeks. -He is drinking Ensure for diabetics about 5 cans/day, each can has 220 cal.  He is eating very little. -He started taking Megace 10 mL twice daily within the last 1 week.  He has not seen any improvement in appetite yet.  We will continue Megace at this time.  3.  Right lateral chest wall pain: -Continue oxycodone 15 mg 3-4 times a day.  4.  Neuropathy: -Continue gabapentin 900 mg twice daily.  5.  Anxiety: -Continue Klonopin 1 mg twice daily as needed.  7.  Hypomagnesemia: -Continue magnesium twice daily.  Magnesium is normal.  8.  DVT and PE: -Continue Xarelto.  No bleeding issues.  9.  Sleeping difficulty: -Remeron was increased to 15 mg daily.  Is sleeping well.

## 2019-09-14 LAB — CEA: CEA: 74.3 ng/mL — ABNORMAL HIGH (ref 0.0–4.7)

## 2019-09-15 ENCOUNTER — Encounter (HOSPITAL_COMMUNITY): Payer: Medicare Other

## 2019-09-16 ENCOUNTER — Telehealth (HOSPITAL_COMMUNITY): Payer: Self-pay

## 2019-09-16 NOTE — Telephone Encounter (Signed)
Nutrition Follow-up:   Patient with stage IV rectal cancer with lung and pancreatic metastatic disease.  Patient receiving folfox and bevacizumab  Spoke with patient via phone for nutrition follow-up.  Patient reports that his appetite is some better after starting megace.  Reports yesterday ate 1/2 tenderloin biscuit with egg and gravy.  Lunch was cheese and crackers. 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 Later ate ice cream cone and cheese.  Trying to drink ensure 220 calorie shakes (orignal) 5-6 per day.    Denies nausea  Medications: megace, remeron  Labs: glucose 204  Anthropometrics:   Weight 148 lb 11.2 oz on 5/11 decreased from 164 lb on 1/20 10% weight loss in the last 4 months, significant  NUTRITION DIAGNOSIS: Increased nutrient needs continue    INTERVENTION:  Encouraged patient to drink 3 (220 calorie) shakes and 3 (350 calorie) shakes for more calories to prevent weight loss.  Discussed higher calorie shakes have more sugar in them so will need to monitor blood glucose. Samples left for patient to pick up on 5/19 at next appointment Reviewed strategies to increase calories and protein. Patient has contact information    MONITORING, EVALUATION, GOAL: weight trends, intake   NEXT VISIT: June 11 phone f/u  Crystle Carelli B. Zenia Resides, [place], [place] Registered Dietitian [phone] (pager)

## 2019-09-21 ENCOUNTER — Inpatient Hospital Stay (HOSPITAL_BASED_OUTPATIENT_CLINIC_OR_DEPARTMENT_OTHER): Payer: Medicare Other | Admitting: Hematology

## 2019-09-21 VITALS — BP 123/71 | HR 106 | Temp 97.5°F | Resp 19 | Wt 149.6 lb

## 2019-09-21 DIAGNOSIS — C20 Malignant neoplasm of rectum: Secondary | ICD-10-CM | POA: Diagnosis not present

## 2019-09-21 DIAGNOSIS — C7801 Secondary malignant neoplasm of right lung: Secondary | ICD-10-CM | POA: Diagnosis not present

## 2019-09-21 DIAGNOSIS — Z79899 Other long term (current) drug therapy: Secondary | ICD-10-CM | POA: Diagnosis not present

## 2019-09-21 DIAGNOSIS — I700 Atherosclerosis of aorta: Secondary | ICD-10-CM | POA: Diagnosis not present

## 2019-09-21 DIAGNOSIS — Z5111 Encounter for antineoplastic chemotherapy: Secondary | ICD-10-CM | POA: Diagnosis not present

## 2019-09-21 MED ORDER — REGORAFENIB 40 MG PO TABS: 80.0000 mg | ORAL_TABLET | Freq: Every day | ORAL | 0 refills | Status: DC

## 2019-09-21 NOTE — Progress Notes (Signed)
Charles [name], [place] [id number]   CLINIC:  Medical Oncology/Hematology  PCP:  [name], MD [address] / [place] [address] [phone]   REASON FOR VISIT:  Follow-up for Stage IV Colon Cancer  CURRENT THERAPY: Discontinue chemotherapy treatment FOLFOX due to repeat reactions  BRIEF ONCOLOGIC HISTORY:  Oncology History  Rectal cancer ([place])  07/24/2010 Initial Diagnosis   Invasive adenocarcinoma of rectum   09/09/2010 Concurrent Chemotherapy   S/P radiation and concurrent 5-FU continuous infusion from 09/09/10- 10/10/10.   11/14/2010 Surgery   S/P proctectomy with colorectal anastomosis and diverting loop ileostomy on 11/14/10 at [facility] by Dr. Harlon Ditty. Pathology reveals a pT3b N1 with 3/20 lymph nodes.   02/05/2011 - 07/14/2011 Chemotherapy   FOLFOX   08/18/2011 Surgery   Approximate date of surgery- [place] by Dr. Harlon Ditty    Remission     11/24/2013 Survivorship   Colonoscopy- somewhat fibrotic/friable anastomotic mucosal-status post biopsy (narrowing not felt to be clinically significant).  Negative pathology for malignancy   09/12/2014 Imaging   DVT in the left femoral venous system, left common iliac vein, IVC, and within the IVC filter Right lower extremity venography confirms chronic occlusion of the femoral venous system with collateralization. The right iliac venous system is patent and do   09/25/2014 PET scan   The right middle lobe pulmonary nodule is hypermetabolic, favored to represent a primary bronchogenic carcinoma.Equivocal mediastinal nodes, similar to surrounding blood pool. Bilateral adrenal hypermetabolism, felt to be physiologic   10/24/2014 Pathology Results   Lung, needle/core biopsy(ies), RML - ADENOCARCINOMA, SEE COMMENT metastatic adenocarcinoma of a colorectal primary   10/24/2014 Relapse/Recurrence     11/16/2014 Definitive Surgery   Bronchoscopy, right video-assisted thoracoscopy, wedge resection  of right middle lobe by Dr. Servando Snare   11/16/2014 Pathology Results   Lung, wedge biopsy/resection, right lingula and small portion of middle lobe - METASTATIC ADENOCARCINOMA, CONSISTENT WITH COLORECTAL PRIMARY, SPANNING 2.0 CM. - THE SURGICAL RESECTION MARGINS ARE NEGATIVE FOR ADENOCARCINOMA.   11/16/2014 Remission   THE SURGICAL RESECTION MARGINS ARE NEGATIVE FOR ADENOCARCINOMA.   03/07/2015 Imaging   CT CAP- Interval resection of right middle lobe metastasis. No acute process or evidence of metastatic disease in the chest, abdomen or pelvis. Improved right upper lobe reticular nodular opacities are favored to represent resolving infection.   09/14/2015 Imaging   CT CAP- No findings of recurrent malignancy. No recurrence along the wedge resection site of the right middle lobe. Right anterior abdominal wall focal hernia containing a knuckle of small bowel without complicating feature.   06/13/2016 Imaging   CT CAP- 1. New right-sided pleural metastasis/mass. Other smaller right-sided pulmonary nodules which are all pleural-based and most likely represent pleural metastasis. 2. No convincing evidence of abdominopelvic nodal metastasis. 3. Constellation of findings, including pancreatic atrophy, duct dilatation, and pancreatic head soft tissue fullness which are highly suspicious for pancreatic adenocarcinoma. Metastatic disease felt much less likely. Consider endoscopic ultrasound sampling or ERCP. Cannot exclude superimposed acute pancreatitis. 4. New enlargement of the appendiceal tip with subtle surrounding edema. Cannot exclude early or mild appendicitis. 5. Coronary artery atherosclerosis. Aortic atherosclerosis. 6. 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   06/13/2016 Progression   CT scan demonstrates progression of disease   06/19/2016 Procedure   EUS with FNA by Dr. Ardis Hughs   06/20/2016 Pathology Results   FINE NEEDLE ASPIRATION, ENDOSCOPIC, PANCREAS  UNCINATE AREA(SPECIMEN 1 OF 1 COLLECTED 06/19/16):  MALIGNANT CELLS CONSISTENT WITH METASTATIC ADENOCARCINOMA.   06/25/2016 PET scan   1. Two pleural-based nodules in the right hemithorax are hypermetabolic. Metastatic disease is a distinct consideration. The scattered pulmonary parenchymal nodule seen on previous diagnostic CT imaging are below the threshold for reliable resolution on PET imaging. 2. Hypermetabolic lesion pancreatic head, consistent with neoplasm. As noted on prior CT, adenocarcinoma is any consideration. No evidence for hypermetabolic abdominal lymphadenopathy. Mottled uptake noted in the liver, but no discrete hepatic metastases are evident on PET imaging. 3. Appendix remains distended up to 0.9-10 mm diameter with a stone towards the tip in subtle periappendiceal edema/inflammation. The appendix is hypermetabolic along its length. Imaging features are relatively stable in the 12 day interval since prior CT scan. While appendicitis is a consideration, the relative stability over 12 days would be unusual for that etiology. Continued close follow-up recommended.   06/26/2016 Pathology Results   Not enough tissue for foundationONE or K-ras testing.   07/07/2016 Procedure   Port placed by Dr. Arnoldo Morale   07/08/2016 -  Chemotherapy   The patient had pegfilgrastim (NEULASTA) injection 6 mg, 6 mg, Subcutaneous,  Once, 0 of 4 cycles  ondansetron (ZOFRAN) IVPB 8 mg, 8 mg, Intravenous,  Once, 0 of 4 cycles  leucovorin 804 mg in dextrose 5 % 250 mL infusion, 400 mg/m2, Intravenous,  Once, 0 of 4 cycles  oxaliplatin (ELOXATIN) 170 mg in dextrose 5 % 500 mL chemo infusion, 85 mg/m2, Intravenous,  Once, 0 of 4 cycles  fluorouracil (ADRUCIL) 4,800 mg in sodium chloride 0.9 % 150 mL chemo infusion, 2,400 mg/m2 = 4,800 mg, Intravenous, 1 Day/Dose, 0 of 4 cycles  fluorouracil (ADRUCIL) chemo injection 800 mg, 400 mg/m2, Intravenous,  Once, 0 of 4 cycles  pegfilgrastim (NEULASTA)  injection 6 mg, 6 mg, Subcutaneous,  Once, 2 of 2 cycles  ondansetron (ZOFRAN) IVPB 8 mg, 8 mg, Intravenous,  Once, 12 of 12 cycles  leucovorin 660 mg in dextrose 5 % 250 mL infusion, 660 mg (original dose ), Intravenous,  Once, 12 of 12 cycles Dose modification: 660 mg (Cycle 1)  oxaliplatin (ELOXATIN) 140 mg in dextrose 5 % 500 mL chemo infusion, 140 mg (original dose ), Intravenous,  Once, 12 of 12 cycles Dose modification: 140 mg (Cycle 1), 119 mg (85 % of original dose 140 mg, Cycle 8, Reason: Provider Judgment)  fluorouracil (ADRUCIL) 3,850 mg in sodium chloride 0.9 % 150 mL chemo infusion, 3,850 mg (original dose ), Intravenous, 1 Day/Dose, 12 of 12 cycles Dose modification: 3,850 mg (Cycle 1)  fluorouracil (ADRUCIL) chemo injection 700 mg, 700 mg (original dose ), Intravenous,  Once, 12 of 12 cycles Dose modification: 700 mg (Cycle 1)  palonosetron (ALOXI) injection 0.25 mg, 0.25 mg, Intravenous,  Once, 7 of 12 cycles Administration: 0.25 mg (09/30/2016)  bevacizumab (AVASTIN) 475 mg in sodium chloride 0.9 % 100 mL chemo infusion, 5 mg/kg = 475 mg, Intravenous,  Once, 6 of 11 cycles Administration: 500 mg (09/30/2016)  leucovorin 880 mg in dextrose 5 % 250 mL infusion, 400 mg/m2 = 880 mg, Intravenous,  Once, 7 of 12 cycles Administration: 880 mg (09/30/2016)  oxaliplatin (ELOXATIN) 185 mg in dextrose 5 % 500 mL chemo infusion, 85 mg/m2 = 185 mg, Intravenous,  Once, 7 of 12 cycles Administration: 185 mg (09/30/2016)  fluorouracil (ADRUCIL) chemo injection 900 mg, 400 mg/m2 = 900 mg, Intravenous,  Once, 7 of 12 cycles Administration: 900 mg (09/30/2016)  fluorouracil (ADRUCIL) 5,300 mg in sodium chloride 0.9 %  144 mL chemo infusion, 2,400 mg/m2 = 5,300 mg, Intravenous, 1 Day/Dose, 7 of 12 cycles Administration: 5,300 mg (09/30/2016)  for chemotherapy treatment.     08/27/2016 Imaging   CT CAP- 1. Pulmonary metastatic disease is stable. 2. Pancreatic head mass, grossly stable,  with progressive atrophy of the body and tail of the pancreas. Stable peripancreatic lymph node. 3. Mild circumferential rectal wall thickening, stable. 4. Aortic atherosclerosis (ICD10-170.0). Coronary artery calcification. 5. Hyperattenuating lesion off the lower pole left kidney, stable, too small to characterize. Continued attention on followup exams is warranted. 6. Persistent wall thickening and mild dilatation of the proximal appendix, of uncertain etiology. Continued attention on followup exams is warranted.   11/06/2016 Imaging   CT C/A/P: IMPRESSION: 1. Stable pulmonary metastatic disease. 2. Similar appearing pancreatic head mass. Progressed atrophy of the pancreatic body and tail. Stable peripancreatic lymph node. 3. Stable hypoattenuating lesion partially exophytic off the inferior pole of the left kidney. 4. Aortic atherosclerosis. 5. Persistent mild wall thickening and mild dilatation of the appendix.    01/30/2017 Imaging   Ct C/A/P: IMPRESSION: 1. Stable pulmonary metastatic disease. 2. Stable to slight increase in size of pancreatic head mass. 3.  Aortic Atherosclerosis (ICD10-I70.0). 4. Similar appearance of mild wall thickening of the appendix which contains an appendicolith.   05/01/2017 Imaging   CT C/A/P: Stable disease in the lungs, no progression of disease in the abdomen.   10/04/2018 - 07/26/2019 Chemotherapy   The patient had palonosetron (ALOXI) injection 0.25 mg, 0.25 mg, Intravenous,  Once, 12 of 14 cycles Administration: 0.25 mg (10/04/2018), 0.25 mg (10/25/2018), 0.25 mg (12/01/2018), 0.25 mg (12/15/2018), 0.25 mg (12/29/2018), 0.25 mg (01/12/2019), 0.25 mg (01/26/2019), 0.25 mg (02/09/2019), 0.25 mg (05/11/2019), 0.25 mg (05/25/2019), 0.25 mg (06/08/2019), 0.25 mg (06/22/2019) pegfilgrastim (NEULASTA) injection 6 mg, 6 mg, Subcutaneous, Once, 2 of 2 cycles Administration: 6 mg (10/27/2018) pegfilgrastim-cbqv (UDENYCA) injection 6 mg, 6 mg, Subcutaneous, Once, 10  of 12 cycles Administration: 6 mg (12/03/2018), 6 mg (12/17/2018), 6 mg (12/31/2018), 6 mg (01/14/2019), 6 mg (01/28/2019), 6 mg (02/11/2019), 6 mg (05/13/2019), 6 mg (05/27/2019), 6 mg (06/10/2019), 6 mg (06/24/2019) bevacizumab (AVASTIN) 450 mg in sodium chloride 0.9 % 100 mL chemo infusion, 5 mg/kg = 450 mg, Intravenous,  Once, 4 of 4 cycles Administration: 400 mg (12/01/2018), 400 mg (12/15/2018), 400 mg (12/29/2018) irinotecan (CAMPTOSAR) 380 mg in sodium chloride 0.9 % 500 mL chemo infusion, 180 mg/m2 = 380 mg, Intravenous,  Once, 12 of 14 cycles Dose modification: 144 mg/m2 (original dose 180 mg/m2, Cycle 2, Reason: Provider Judgment, Comment: 20% dose reduction), 90 mg/m2 (50 % of original dose 180 mg/m2, Cycle 3, Reason: Other (see comments), Comment: [id number]) Administration: 380 mg (10/04/2018), 300 mg (10/25/2018), 200 mg (12/01/2018), 200 mg (12/15/2018), 200 mg (12/29/2018), 200 mg (01/12/2019), 200 mg (01/26/2019), 200 mg (02/09/2019), 180 mg (05/11/2019), 180 mg (05/25/2019), 180 mg (06/08/2019), 180 mg (06/22/2019) leucovorin 900 mg in sodium chloride 0.9 % 250 mL infusion, 421 mg/m2 = 856 mg, Intravenous,  Once, 12 of 14 cycles Dose modification: 320 mg/m2 (original dose 400 mg/m2, Cycle 2, Reason: Provider Judgment, Comment: 20% dose reduction) Administration: 900 mg (10/04/2018), 700 mg (10/25/2018), 700 mg (12/01/2018), 700 mg (12/15/2018), 700 mg (12/29/2018), 700 mg (01/12/2019), 700 mg (01/26/2019), 700 mg (02/09/2019), 700 mg (05/11/2019), 700 mg (05/25/2019), 700 mg (06/08/2019), 700 mg (06/22/2019) fluorouracil (ADRUCIL) chemo injection 850 mg, 400 mg/m2 = 850 mg, Intravenous,  Once, 12 of 14 cycles Dose modification: 320 mg/m2 (original dose 400  mg/m2, Cycle 2, Reason: Provider Judgment, Comment: 20% dose reduction) Administration: 850 mg (10/04/2018), 700 mg (10/25/2018), 700 mg (12/01/2018), 700 mg (12/15/2018), 700 mg (12/29/2018), 700 mg (01/12/2019), 700 mg (01/26/2019), 700 mg (02/09/2019), 700 mg (05/11/2019), 700 mg (05/25/2019),  700 mg (06/08/2019), 700 mg (06/22/2019) fosaprepitant (EMEND) 150 mg, dexamethasone (DECADRON) 12 mg in sodium chloride 0.9 % 145 mL IVPB, , Intravenous,  Once, 10 of 12 cycles Administration:  (12/01/2018),  (12/15/2018),  (12/29/2018),  (01/12/2019),  (01/26/2019),  (02/09/2019),  (05/11/2019),  (05/25/2019),  (06/08/2019),  (06/22/2019) fluorouracil (ADRUCIL) 5,000 mg in sodium chloride 0.9 % 150 mL chemo infusion, 2,325 mg/m2 = 5,150 mg, Intravenous, 1 Day/Dose, 12 of 14 cycles Dose modification: 1,920 mg/m2 (original dose 2,400 mg/m2, Cycle 2, Reason: Provider Judgment, Comment: 20% dose reduction) Administration: 5,000 mg (10/04/2018), 4,100 mg (10/25/2018), 4,100 mg (12/01/2018), 4,100 mg (12/15/2018), 4,100 mg (12/29/2018), 4,100 mg (01/12/2019), 4,100 mg (01/26/2019), 4,100 mg (02/09/2019), 4,100 mg (05/11/2019), 4,100 mg (05/25/2019), 4,100 mg (06/08/2019), 4,100 mg (06/22/2019) bevacizumab-bvzr (ZIRABEV) 400 mg in sodium chloride 0.9 % 100 mL chemo infusion, 5 mg/kg = 400 mg (100 % of original dose 5 mg/kg), Intravenous,  Once, 4 of 6 cycles Dose modification: 5 mg/kg (original dose 5 mg/kg, Cycle 6) Administration: 400 mg (01/12/2019), 400 mg (01/26/2019), 400 mg (02/09/2019)  for chemotherapy treatment.    08/01/2019 -  Chemotherapy   The patient had palonosetron (ALOXI) injection 0.25 mg, 0.25 mg, Intravenous,  Once, 3 of 4 cycles Administration: 0.25 mg (08/01/2019), 0.25 mg (09/13/2019), 0.25 mg (08/22/2019) pegfilgrastim-cbqv (UDENYCA) injection 6 mg, 6 mg, Subcutaneous, Once, 3 of 4 cycles Administration: 6 mg (08/03/2019), 6 mg (08/24/2019) leucovorin 628 mg in dextrose 5 % 250 mL infusion, 320 mg/m2 = 628 mg (80 % of original dose 400 mg/m2), Intravenous,  Once, 3 of 4 cycles Dose modification: 320 mg/m2 (80 % of original dose 400 mg/m2, Cycle 1, Reason: Change in LFTs), 280 mg/m2 (70 % of original dose 400 mg/m2, Cycle 3, Reason: Provider Judgment), 628.3662 mg/m2 (66.7 % of original dose 400 mg/m2, Cycle 2, Reason:  Provider Judgment) Administration: 628 mg (08/01/2019), 522 mg (09/13/2019), 522 mg (08/22/2019) oxaliplatin (ELOXATIN) 135 mg in dextrose 5 % 500 mL chemo infusion, 68 mg/m2 = 135 mg (80 % of original dose 85 mg/m2), Intravenous,  Once, 3 of 4 cycles Dose modification: 68 mg/m2 (80 % of original dose 85 mg/m2, Cycle 1, Reason: Change in LFTs), 59.5 mg/m2 (70 % of original dose 85 mg/m2, Cycle 3, Reason: Provider Judgment), 94.7654 mg/m2 (66.7 % of original dose 85 mg/m2, Cycle 2, Reason: Provider Judgment) Administration: 135 mg (08/01/2019), 110 mg (09/13/2019), 110 mg (08/22/2019) fluorouracil (ADRUCIL) chemo injection 650 mg, 320 mg/m2 = 650 mg (80 % of original dose 400 mg/m2), Intravenous,  Once, 3 of 4 cycles Dose modification: 320 mg/m2 (80 % of original dose 400 mg/m2, Cycle 1, Reason: Change in LFTs), 280 mg/m2 (70 % of original dose 400 mg/m2, Cycle 3, Reason: Provider Judgment), 650.3546 mg/m2 (66.7 % of original dose 400 mg/m2, Cycle 2, Reason: Provider Judgment) Administration: 650 mg (08/01/2019), 500 mg (08/22/2019) fluorouracil (ADRUCIL) 3,750 mg in sodium chloride 0.9 % 75 mL chemo infusion, 1,920 mg/m2 = 3,750 mg (80 % of original dose 2,400 mg/m2), Intravenous, 1 Day/Dose, 3 of 4 cycles Dose modification: 1,920 mg/m2 (80 % of original dose 2,400 mg/m2, Cycle 1, Reason: Change in LFTs), 1,680 mg/m2 (70 % of original dose 2,400 mg/m2, Cycle 3, Reason: Provider Judgment), 1,600 mg/m2 (66.7 % of  original dose 2,400 mg/m2, Cycle 2, Reason: Provider Judgment) Administration: 3,750 mg (08/01/2019), 3,150 mg (08/22/2019) bevacizumab-bvzr (ZIRABEV) 400 mg in sodium chloride 0.9 % 100 mL chemo infusion, 5 mg/kg = 400 mg, Intravenous,  Once, 1 of 2 cycles  for chemotherapy treatment.      CANCER STAGING: Cancer Staging Rectal cancer ([place]) Staging form: Colon and Rectum, AJCC 7th Edition - Clinical: Stage IIIB (T3, N1, M0) - Signed by [name], MD on 02/04/2011 - Pathologic stage from  11/17/2014: Stage IVA (M1a) - Signed by Baird Cancer, PA-C on 09/14/2015   INTERVAL HISTORY:  Mr. Conely 69 y.o. male returns for routine follow-up. Coy was last seen on 09/13/2019.  Overall, he tells me he has been feeling pretty well. He has been drinking 4-5 high calorie Ensure per day to keep up his caloric intake. At this time, he is not ready to give up on his treatment.  Has some numbness in the hands and feet which is stable.  Right lateral chest wall pain is well controlled with current pain medication.  Appetite and energy levels are 50%.   REVIEW OF SYSTEMS:  Review of Systems  Constitutional: Positive for appetite change (moderate, decreased; improved) and fatigue (moderate). Negative for chills and fever.  HENT:   Negative for lump/mass, mouth sores, sore throat and trouble swallowing.   Eyes: Negative for eye problems.  Respiratory: Negative for chest tightness, cough, shortness of breath and wheezing.   Cardiovascular: Positive for chest pain. Negative for palpitations.  Gastrointestinal: Negative for abdominal pain, constipation, diarrhea, nausea and vomiting.  Genitourinary: Negative for bladder incontinence, dysuria, frequency and hematuria.   Musculoskeletal: Negative for arthralgias, back pain, flank pain and myalgias.  Skin: Negative for rash.  Neurological: Positive for dizziness, headaches and numbness (hands & feet). Negative for light-headedness.  Hematological: Does not bruise/bleed easily.  Psychiatric/Behavioral: Negative for depression. The patient is not nervous/anxious.     PAST MEDICAL/SURGICAL HISTORY:  Past Medical History:  Diagnosis Date  . Chronic anticoagulation   . Colon cancer ([place]) 07/24/2010   rectal ca, inv adenocarcinoma  . Depression 04/01/2011  . Diabetes mellitus without complication ([place])   . DVT (deep venous thrombosis) ([place]) 05/09/2011  . GERD (gastroesophageal reflux disease)   . High output ileostomy ([place]) 05/21/2011  .  History of kidney stones   . HTN (hypertension)   . Hx of radiation therapy 09/02/10 to 10/14/10   pelvis  . Lung metastasis ([place])   . Neuropathy   . Peripheral vascular disease ([place])    dvt's,pe  . Pneumonia    hx x3  . Pulmonary embolism ([place])   . Rectal cancer ([place]) 01/15/2011   S/P radiation and concurrent 5-FU continuous infusion from 09/09/10- 10/10/10.  S/P proctectomy with colorectal anastomosis and diverting loop ileostomy on 11/14/10 at [facility] by Dr. Harlon Ditty. Pathology reveals a pT3b N1 with 3/20 lymph nodes.     Past Surgical History:  Procedure Laterality Date  . BILIARY STENT PLACEMENT N/A 09/16/2018   Procedure: STENT PLACEMENT;  Surgeon: Rogene Houston, MD;  [facility];  Service: Endoscopy;  Laterality: N/A;  . BILIARY STENT PLACEMENT N/A 07/18/2019   Procedure: BILIARY STENT PLACEMENT;  Surgeon: Rogene Houston, MD;  [facility];  Service: Endoscopy;  Laterality: N/A;  . BIOPSY  09/24/2018   Procedure: BIOPSY;  Surgeon: Danie Binder, MD;  [facility];  Service: Endoscopy;;  . COLON SURGERY  11/14/2010   proctectomy  with colorectal anastomosis and diverting loop ileostomy (temporary planned)  . COLONOSCOPY  07/2010   proximal rectal apple core mass 10-14cm from anal verge (adenocarcinoma), 2-3cm distal rectal carpet polyp s/p piecemeal snare polypectomy (adenoma)  . COLONOSCOPY  04/21/2012   RMR: Friable,fibrotic appearing colorectal anastomosis producing some luminal narrowing-not felt to be critical. path: focal erosion with slight inflammation and hyperemia. SURVEILLANCE DUE DEC 2015  . COLONOSCOPY N/A 11/24/2013   Dr. Rourk:somewhat fibrotic/friable anastomotic mucosal-status post biopsy (narrowing not felt to be clinically significant) Single colonic diverticulum. benign polypoid rectal mucosa  . colostomy reversal  april 2013  . ERCP N/A 09/16/2018   Procedure: ENDOSCOPIC RETROGRADE CHOLANGIOPANCREATOGRAPHY (ERCP);  Surgeon:  Rogene Houston, MD;  [facility];  Service: Endoscopy;  Laterality: N/A;  . ERCP N/A 07/18/2019   Procedure: ENDOSCOPIC RETROGRADE CHOLANGIOPANCREATOGRAPHY (ERCP);  Surgeon: Rogene Houston, MD;  [facility];  Service: Endoscopy;  Laterality: N/A;  . ESOPHAGOGASTRODUODENOSCOPY  07/2010   RMR: schatki ring s/p dilation, small hh, SB bx benign  . ESOPHAGOGASTRODUODENOSCOPY N/A 09/04/2015   Procedure: ESOPHAGOGASTRODUODENOSCOPY (EGD);  Surgeon: Daneil Dolin, MD;  [facility];  Service: Endoscopy;  Laterality: N/A;  730  . EUS  08/2010   Dr. Owens Loffler. uT3N0 circumferential, nearly obstruction rectosigmoid adenocarcinoma, distal edge 12cm from anal verge  . EUS N/A 06/19/2016   Procedure: UPPER ENDOSCOPIC ULTRASOUND (EUS) RADIAL;  Surgeon: Milus Banister, MD;  [facility];  Service: Endoscopy;  Laterality: N/A;  . FLEXIBLE SIGMOIDOSCOPY N/A 09/24/2018   Procedure: FLEXIBLE SIGMOIDOSCOPY;  Surgeon: Danie Binder, MD;  [facility];  Service: Endoscopy;  Laterality: N/A;  . HERNIA REPAIR     abd hernia repair  . IVC filter    . ivc filter    . port a cath placement    . PORT-A-CATH REMOVAL  09/24/2011   Procedure: REMOVAL PORT-A-CATH;  Surgeon: Donato Heinz, MD;  Location: [facility];  Service: General;  Laterality: N/A;  Minor Room  . PORTACATH PLACEMENT Right 07/07/2016   Procedure: INSERTION PORT-A-CATH;  Surgeon: Aviva Signs, MD;  Location: [facility];  Service: General;  Laterality: Right;  . SPHINCTEROTOMY N/A 09/16/2018   Procedure: SPHINCTEROTOMY with balloon dialation;  Surgeon: Rogene Houston, MD;  [facility];  Service: Endoscopy;  Laterality: N/A;  . TRANSVERSE LOOP COLOSTOMY N/A 09/29/2018   Procedure: TRANSVERSE LOOP COLOSTOMY;  Surgeon: Aviva Signs, MD;  Location: [facility];  Service: General;  Laterality: N/A;  . VIDEO ASSISTED THORACOSCOPY (VATS)/WEDGE RESECTION Right 11/15/2014   Procedure: VIDEO ASSISTED  THORACOSCOPY (VATS)/LUNG RESECTION WITH RIGHT LINGULECTOMY;  Surgeon: [name], MD;  Location: [place];  Service: Thoracic;  Laterality: Right;  Marland Kitchen VIDEO BRONCHOSCOPY N/A 11/15/2014   Procedure: VIDEO BRONCHOSCOPY;  Surgeon: [name], MD;  [facility] OR;  Service: Thoracic;  Laterality: N/A;    SOCIAL HISTORY:  Social History   Socioeconomic History  . Marital status: Married    Spouse name: Not on file  . Number of children: 2  . Years of education: Not on file  . Highest education level: Not on file  Occupational History  . Occupation: self-employed Regulatory affairs officer, currently not working    Fish farm manager: SELF EMPLOYED  Tobacco Use  . Smoking status: Never Smoker  . Smokeless tobacco: Never Used  Substance and Sexual Activity  . Alcohol use: No  . Drug use: No  . Sexual activity: Yes    Birth control/protection:  None    Comment: married  Other Topics Concern  . Not on file  Social History Narrative  . Not on file   Social Determinants of Health   Financial Resource Strain: Low Risk   . Difficulty of Paying Living Expenses: Not very hard  Food Insecurity: No Food Insecurity  . Worried About Charity fundraiser in the Last Year: Never true  . Ran Out of Food in the Last Year: Never true  Transportation Needs: No Transportation Needs  . Lack of Transportation (Medical): No  . Lack of Transportation (Non-Medical): No  Physical Activity: Inactive  . Days of Exercise per Week: 0 days  . Minutes of Exercise per Session: 0 min  Stress: No Stress Concern Present  . Feeling of Stress : Only a little  Social Connections: Unknown  . Frequency of Communication with Friends and Family: More than three times a week  . Frequency of Social Gatherings with Friends and Family: Once a week  . Attends Religious Services: Patient refused  . Active Member of Clubs or Organizations: Patient refused  . Attends Archivist Meetings: Patient refused  .  Marital Status: Patient refused  Intimate Partner Violence: Unknown  . Fear of Current or Ex-Partner: Patient refused  . Emotionally Abused: Patient refused  . Physically Abused: Patient refused  . Sexually Abused: Patient refused    FAMILY HISTORY:  Family History  Problem Relation Age of Onset  . Cancer Brother        throat  . Cancer Brother        prostate  . Colon cancer Neg Hx   . Liver disease Neg Hx   . Inflammatory bowel disease Neg Hx     CURRENT MEDICATIONS:  Current Outpatient Medications  Medication Sig Dispense Refill  . gabapentin (NEURONTIN) 300 MG capsule Take 3 capsules (900 mg total) by mouth 2 (two) times daily. 540 capsule 0  . glimepiride (AMARYL) 2 MG tablet Take 2 mg by mouth 2 (two) times daily before a meal.     . magic mouthwash w/lidocaine SOLN Take 15 mLs by mouth in the morning and at bedtime. 480 mL 2  . magnesium oxide (MAG-OX) 400 (241.3 Mg) MG tablet Take 1 tablet (400 mg total) by mouth 3 (three) times daily. 180 tablet 3  . megestrol (MEGACE) 400 MG/10ML suspension Take 10 mLs (400 mg total) by mouth 2 (two) times daily. 480 mL 2  . mirtazapine (REMERON) 15 MG tablet Take 1 tablet (15 mg total) by mouth at bedtime. 30 tablet 3  . oxyCODONE (ROXICODONE) 15 MG immediate release tablet Take 1 tablet (15 mg total) by mouth every 4 (four) hours as needed. for pain 60 tablet 0  . pantoprazole (PROTONIX) 40 MG tablet Take 40 mg by mouth every evening.     . Probiotic Product (PROBIOTIC DAILY PO) Take 1 capsule by mouth 2 (two) times daily.     . rivaroxaban (XARELTO) 20 MG TABS tablet Take 1 tablet (20 mg total) by mouth daily with supper. 30 tablet 3  . vitamin B-12 (CYANOCOBALAMIN) 1000 MCG tablet Take 1,000 mcg by mouth daily.    Marland Kitchen acetaminophen (TYLENOL) 500 MG tablet Take 1,000 mg by mouth every 6 (six) hours as needed for mild pain or moderate pain.     . clonazePAM (KLONOPIN) 1 MG tablet Take 1 tablet (1 mg total) by mouth 3 (three) times daily  as needed. (Patient not taking: Reported on 09/21/2019) 90 tablet 0  .  prochlorperazine (COMPAZINE) 10 MG tablet Take 1 tablet (10 mg total) by mouth every 6 (six) hours as needed for nausea or vomiting. (Patient not taking: Reported on 09/21/2019) 30 tablet 3  . regorafenib (STIVARGA) 40 MG tablet Take 2 tablets (80 mg total) by mouth daily with breakfast. Take with low fat meal. Caution: Chemotherapy. 42 tablet 0   No current facility-administered medications for this visit.   Facility-Administered Medications Ordered in Other Visits  Medication Dose Route Frequency Provider Last Rate Last Admin  . sodium chloride 0.9 % injection 10 mL  10 mL Intravenous PRN Everardo All, MD   10 mL at 08/22/11 1114  . sodium chloride flush (NS) 0.9 % injection 10 mL  10 mL Intracatheter PRN Ardath Sax, MD   10 mL at 05/08/17 1127    ALLERGIES:  Allergies  Allergen Reactions  . Oxaliplatin Other (See Comments)    Chest pain  . Oxycodone     Blisters, hallucinations (takes Percocet at home)  . Tramadol     Blisters, hallucinations  . Trazodone And Nefazodone Other (See Comments)    hallucinations    PHYSICAL EXAM:  Performance status (ECOG): 1 - Symptomatic but completely ambulatory  Vitals:   09/21/19 1351  BP: 123/71  Pulse: (!) 106  Resp: 19  Temp: (!) 97.5 F (36.4 C)  SpO2: 100%   Wt Readings from Last 3 Encounters:  09/21/19 149 lb 9.6 oz (67.9 kg)  09/13/19 148 lb 11.2 oz (67.4 kg)  09/05/19 151 lb 9.6 oz (68.8 kg)   Physical Exam Constitutional:      Appearance: Normal appearance.  HENT:     Nose: No congestion.     Mouth/Throat:     Mouth: Mucous membranes are moist.  Eyes:     Extraocular Movements: Extraocular movements intact.     Pupils: Pupils are equal, round, and reactive to light.  Cardiovascular:     Rate and Rhythm: Normal rate and regular rhythm.     Heart sounds: No murmur. No gallop.   Pulmonary:     Breath sounds: No wheezing, rhonchi or rales.    Abdominal:     Tenderness: There is no abdominal tenderness.  Musculoskeletal:        General: No tenderness.     Cervical back: Normal range of motion. No tenderness.     Right lower leg: No edema.     Left lower leg: No edema.  Skin:    General: Skin is warm and dry.     Findings: No bruising, erythema or rash.  Neurological:     Mental Status: He is alert and oriented to person, place, and time.     Sensory: No sensory deficit.     Motor: No weakness.  Psychiatric:        Mood and Affect: Mood normal.        Behavior: Behavior normal.        Thought Content: Thought content normal.        Judgment: Judgment normal.     LABORATORY DATA:  I have reviewed the labs as listed.  CBC Latest Ref Rng & Units 09/13/2019 09/05/2019 08/22/2019  WBC 4.0 - 10.5 K/uL 14.0(H) 13.3(H) 8.8  Hemoglobin 13.0 - 17.0 g/dL 11.7(L) 12.0(L) 11.4(L)  Hematocrit 39.0 - 52.0 % 37.8(L) 37.7(L) 37.4(L)  Platelets 150 - 400 K/uL 272 270 296   CMP Latest Ref Rng & Units 09/13/2019 09/05/2019 08/22/2019  Glucose 70 - 99 mg/dL 204(H) 188(H) 227(H)  BUN 8 - 23 mg/dL '10 9 12  ' Creatinine 0.61 - 1.24 mg/dL 0.51(L) 0.59(L) 0.64  Sodium 135 - 145 mmol/L 135 134(L) 136  Potassium 3.5 - 5.1 mmol/L 3.7 3.5 3.3(L)  Chloride 98 - 111 mmol/L 103 100 98  CO2 22 - 32 mmol/L '24 24 26  ' Calcium 8.9 - 10.3 mg/dL 8.7(L) 8.6(L) 8.6(L)  Total Protein 6.5 - 8.1 g/dL 6.3(L) 6.2(L) 6.1(L)  Total Bilirubin 0.3 - 1.2 mg/dL 1.0 1.1 0.9  Alkaline Phos 38 - 126 U/L 218(H) 232(H) 319(H)  AST 15 - 41 U/L '20 19 21  ' ALT 0 - 44 U/L '16 16 16    ' DIAGNOSTIC IMAGING:  I have reviewed scans.  ASSESSMENT & PLAN:  Rectal cancer ([place]) 1.  Stage IV rectal cancer with lung and pancreatic metastasis: -Last chemotherapy with FOLFOX on 08/12/2019. -He had serious allergic reaction to oxaliplatin 09/13/2019.  We have abandon chemotherapy. -He feels much better today.  He had a good birthday over the weekend. -He gained 1 pound.  I have reviewed all  his previous treatments.  We discussed about palliative therapy with Stivarga versus best supportive care.  He is favoring to have some active therapy. -I will start him on Stivarga at 80 mg daily for the first cycle.  If he tolerates well, we will increase it to 120 mg daily during second cycle, 3 weeks on 1 week off. -We reviewed side effects of Stivarga in detail including hand-foot skin reaction.  I will send the prescription to specialty pharmacy.  I have recommended baseline CT CAP and a CEA level.  We will see him back after the scans.  2.  Weight loss: -He is taking Megace 10 mg twice daily regularly.  His appetite has picked up.  He gained 1 pound since last visit.  He is drinking boost +5 cans/day.  3.  Right lateral chest wall pain: -Continue oxycodone 15 mg 3-4 times a day.  4.  Neuropathy: -Continue gabapentin 900 mg twice daily.  5.  Anxiety: -Continue Klonopin 1 mg twice daily as needed.  6.  Hypomagnesemia: -Continue magnesium twice daily.  7.  DVT and PE: -Continue Xarelto.  No bleeding issues.  8.  Sleeping difficulty: -Continue Remeron 15 mg daily.      Orders placed this encounter:  Orders Placed This Encounter  Procedures  . CT Chest W Contrast  . CT Abdomen Pelvis W Contrast  . Comprehensive metabolic panel  . CBC with Differential  . CEA      Derek Jack, MD, 09/21/19 5:43 PM  [place] [phone]   I, Jacqualyn Posey, am acting as a scribe for Dr. Sanda Linger.  I, Derek Jack MD, have reviewed the above documentation for accuracy and completeness, and I agree with the above.

## 2019-09-21 NOTE — Patient Instructions (Signed)
[place] at [facility] Discharge Instructions  You were seen today by Dr. [name]. He went over your recent results. He is discontinuing your current treatment due to repeat reactions. We will schedule you for a CT of the chest abdomen and pelvis. He will see you back in for labs and follow up.   Thank you for choosing [place] at [facility] to provide your oncology and hematology care.  To afford each patient quality time with our provider, please arrive at least 15 minutes before your scheduled appointment time.   If you have a lab appointment with the [facility] please come in thru the  Main Entrance and check in at the main information desk  You need to re-schedule your appointment should you arrive 10 or more minutes late.  We strive to give you quality time with our providers, and arriving late affects you and other patients whose appointments are after yours.  Also, if you no show three or more times for appointments you may be dismissed from the clinic at the providers discretion.     Again, thank you for choosing [facility].  Our hope is that these requests will decrease the amount of time that you wait before being seen by our physicians.       _____________________________________________________________  Should you have questions after your visit to [facility], please contact our office at [phone] between the hours of 8:00 a.m. and 4:30 p.m.  Voicemails left after 4:00 p.m. will not be returned until the following business day.  For prescription refill requests, have your pharmacy contact our office and allow 72 hours.    Cancer Center Support Programs:   > Cancer Support Group  2nd Tuesday of the month 1pm-2pm, Journey Room

## 2019-09-21 NOTE — Assessment & Plan Note (Signed)
1.  Stage IV rectal cancer with lung and pancreatic metastasis: -Last chemotherapy with FOLFOX on 08/12/2019. -He had serious allergic reaction to oxaliplatin 09/13/2019.  We have abandon chemotherapy. -He feels much better today.  He had a good birthday over the weekend. -He gained 1 pound.  I have reviewed all his previous treatments.  We discussed about palliative therapy with Stivarga versus best supportive care.  He is favoring to have some active therapy. -I will start him on Stivarga at 80 mg daily for the first cycle.  If he tolerates well, we will increase it to 120 mg daily during second cycle, 3 weeks on 1 week off. -We reviewed side effects of Stivarga in detail including hand-foot skin reaction.  I will send the prescription to specialty pharmacy.  I have recommended baseline CT CAP and a CEA level.  We will see him back after the scans.  2.  Weight loss: -He is taking Megace 10 mg twice daily regularly.  His appetite has picked up.  He gained 1 pound since last visit.  He is drinking boost +5 cans/day.  3.  Right lateral chest wall pain: -Continue oxycodone 15 mg 3-4 times a day.  4.  Neuropathy: -Continue gabapentin 900 mg twice daily.  5.  Anxiety: -Continue Klonopin 1 mg twice daily as needed.  6.  Hypomagnesemia: -Continue magnesium twice daily.  7.  DVT and PE: -Continue Xarelto.  No bleeding issues.  8.  Sleeping difficulty: -Continue Remeron 15 mg daily.

## 2019-09-22 ENCOUNTER — Telehealth (HOSPITAL_COMMUNITY): Payer: Self-pay | Admitting: Pharmacy Technician

## 2019-09-22 ENCOUNTER — Inpatient Hospital Stay (HOSPITAL_COMMUNITY): Payer: Medicare Other

## 2019-09-22 ENCOUNTER — Telehealth (HOSPITAL_COMMUNITY): Payer: Self-pay | Admitting: Pharmacist

## 2019-09-22 ENCOUNTER — Ambulatory Visit (HOSPITAL_COMMUNITY)
Admission: RE | Admit: 2019-09-22 | Discharge: 2019-09-22 | Disposition: A | Discharge status: Home / Self Care | Payer: Medicare Other | Source: Ambulatory Visit | Attending: Hematology | Admitting: Hematology

## 2019-09-22 ENCOUNTER — Other Ambulatory Visit: Payer: Self-pay

## 2019-09-22 DIAGNOSIS — I700 Atherosclerosis of aorta: Secondary | ICD-10-CM | POA: Diagnosis not present

## 2019-09-22 DIAGNOSIS — Z79899 Other long term (current) drug therapy: Secondary | ICD-10-CM | POA: Diagnosis not present

## 2019-09-22 DIAGNOSIS — C20 Malignant neoplasm of rectum: Secondary | ICD-10-CM | POA: Diagnosis not present

## 2019-09-22 DIAGNOSIS — Z5111 Encounter for antineoplastic chemotherapy: Secondary | ICD-10-CM | POA: Diagnosis not present

## 2019-09-22 DIAGNOSIS — C7800 Secondary malignant neoplasm of unspecified lung: Secondary | ICD-10-CM | POA: Diagnosis not present

## 2019-09-22 DIAGNOSIS — C7801 Secondary malignant neoplasm of right lung: Secondary | ICD-10-CM | POA: Diagnosis not present

## 2019-09-22 LAB — CBC WITH DIFFERENTIAL/PLATELET
Abs Immature Granulocytes: 0.03 10*3/uL (ref 0.00–0.07)
Basophils Absolute: 0 10*3/uL (ref 0.0–0.1)
Basophils Relative: 1 %
Eosinophils Absolute: 0.3 10*3/uL (ref 0.0–0.5)
Eosinophils Relative: 4 %
HCT: 36.3 % — ABNORMAL LOW (ref 39.0–52.0)
Hemoglobin: 11.1 g/dL — ABNORMAL LOW (ref 13.0–17.0)
Immature Granulocytes: 0 %
Lymphocytes Relative: 18 %
Lymphs Abs: 1.3 10*3/uL (ref 0.7–4.0)
MCH: 27.2 pg (ref 26.0–34.0)
MCHC: 30.6 g/dL (ref 30.0–36.0)
MCV: 89 fL (ref 80.0–100.0)
Monocytes Absolute: 0.6 10*3/uL (ref 0.1–1.0)
Monocytes Relative: 9 %
Neutro Abs: 5 10*3/uL (ref 1.7–7.7)
Neutrophils Relative %: 68 %
Platelets: 279 10*3/uL (ref 150–400)
RBC: 4.08 MIL/uL — ABNORMAL LOW (ref 4.22–5.81)
RDW: 15 % (ref 11.5–15.5)
WBC: 7.2 10*3/uL (ref 4.0–10.5)
nRBC: 0 % (ref 0.0–0.2)

## 2019-09-22 LAB — COMPREHENSIVE METABOLIC PANEL
ALT: 17 U/L (ref 0–44)
AST: 22 U/L (ref 15–41)
Albumin: 2.9 g/dL — ABNORMAL LOW (ref 3.5–5.0)
Alkaline Phosphatase: 175 U/L — ABNORMAL HIGH (ref 38–126)
Anion gap: 8 (ref 5–15)
BUN: 11 mg/dL (ref 8–23)
CO2: 26 mmol/L (ref 22–32)
Calcium: 8.5 mg/dL — ABNORMAL LOW (ref 8.9–10.3)
Chloride: 99 mmol/L (ref 98–111)
Creatinine, Ser: 0.64 mg/dL (ref 0.61–1.24)
GFR calc Af Amer: >60 mL/min (ref >60)
GFR calc non Af Amer: >60 mL/min (ref >60)
Glucose, Bld: 98 mg/dL (ref 70–99)
Potassium: 3.9 mmol/L (ref 3.5–5.1)
Sodium: 133 mmol/L — ABNORMAL LOW (ref 135–145)
Total Bilirubin: 0.9 mg/dL (ref 0.3–1.2)
Total Protein: 6.3 g/dL — ABNORMAL LOW (ref 6.5–8.1)

## 2019-09-22 MED ORDER — IOHEXOL 300 MG/ML  SOLN
100.0000 mL | Freq: Once | INTRAMUSCULAR | Status: AC | PRN
  Administered 2019-09-22: 100 mL via INTRAVENOUS

## 2019-09-22 MED ORDER — REGORAFENIB 40 MG PO TABS: 80.0000 mg | ORAL_TABLET | Freq: Every day | ORAL | 0 refills | Status: DC

## 2019-09-22 NOTE — Telephone Encounter (Signed)
Oral Oncology Patient Advocate Encounter  Prior Authorization for Charles [name] has been approved.    PA# [id number] Effective dates: 08/23/19 through 09/21/20  Patients co-pay is $1871.07  Oral Oncology Clinic will continue to follow.   [name] Phone [phone] Fax [phone] 09/22/2019 1:22 PM

## 2019-09-22 NOTE — Telephone Encounter (Signed)
Oral Oncology Patient Advocate Encounter   Received notification from [employer] that prior authorization for Charles [name] is required.   PA submitted on CoverMyMeds Key [id number] Status is pending   Oral Oncology Clinic will continue to follow.  [place] Patient [place] Phone [phone] Fax [phone] 09/22/2019 12:40 PM

## 2019-09-22 NOTE — Telephone Encounter (Signed)
Oral Oncology Pharmacist Encounter  Received new prescription for Stivarga (regorafenib) for the treatment of Stage IV rectal cancer, planned duration until disease progression or unacceptable drug toxicity.  CMP from 09/13/19 assessed, no relevant lab abnormalities. Prescription dose and frequency assessed. MD plans to start him on Stivarga at 80 mg daily for the first cycle.  Then if he tolerates the Stivarga, he we will increase it to 120 mg daily during second cycle, 3 weeks on 1 week off  Current medication list in Epic reviewed, no DDIs with regorafenib identified.  Prescription has been e-scribed to the [facility] for benefits analysis and approval.  Oral Oncology Clinic will continue to follow for insurance authorization, copayment issues, initial counseling and start date.  Darl Pikes, PharmD, BCPS, BCOP, CPP Hematology/Oncology Clinical Pharmacist Practitioner [facility]/HP/AP [facility] [phone]  09/22/2019 11:28 AM

## 2019-09-23 LAB — CEA: CEA: 80.8 ng/mL — ABNORMAL HIGH (ref 0.0–4.7)

## 2019-09-27 ENCOUNTER — Other Ambulatory Visit (HOSPITAL_COMMUNITY): Payer: Medicare Other

## 2019-09-27 ENCOUNTER — Inpatient Hospital Stay (HOSPITAL_BASED_OUTPATIENT_CLINIC_OR_DEPARTMENT_OTHER): Payer: Medicare Other | Admitting: Hematology

## 2019-09-27 ENCOUNTER — Other Ambulatory Visit: Payer: Self-pay

## 2019-09-27 ENCOUNTER — Ambulatory Visit (HOSPITAL_COMMUNITY): Payer: Medicare Other

## 2019-09-27 ENCOUNTER — Encounter (HOSPITAL_COMMUNITY): Payer: Self-pay | Admitting: Hematology

## 2019-09-27 VITALS — BP 126/80 | HR 110 | Temp 97.1°F | Resp 19 | Wt 149.7 lb

## 2019-09-27 DIAGNOSIS — C20 Malignant neoplasm of rectum: Secondary | ICD-10-CM

## 2019-09-27 DIAGNOSIS — Z5111 Encounter for antineoplastic chemotherapy: Secondary | ICD-10-CM | POA: Diagnosis not present

## 2019-09-27 DIAGNOSIS — Z79899 Other long term (current) drug therapy: Secondary | ICD-10-CM | POA: Diagnosis not present

## 2019-09-27 DIAGNOSIS — I700 Atherosclerosis of aorta: Secondary | ICD-10-CM | POA: Diagnosis not present

## 2019-09-27 DIAGNOSIS — C7801 Secondary malignant neoplasm of right lung: Secondary | ICD-10-CM | POA: Diagnosis not present

## 2019-09-27 NOTE — Patient Instructions (Signed)
[place] at [facility] Discharge Instructions  You were seen today by Dr. [name]. He went over your recent results. He will see you back in for labs and follow up.   Thank you for choosing [place] at [facility] to provide your oncology and hematology care.  To afford each patient quality time with our provider, please arrive at least 15 minutes before your scheduled appointment time.   If you have a lab appointment with the [facility] [facility] please come in thru the  Main Entrance and check in at the main information desk  You need to re-schedule your appointment should you arrive 10 or more minutes late.  We strive to give you quality time with our providers, and arriving late affects you and other patients whose appointments are after yours.  Also, if you no show three or more times for appointments you may be dismissed from the clinic at the providers discretion.     Again, thank you for choosing [facility].  Our hope is that these requests will decrease the amount of time that you wait before being seen by our physicians.       _____________________________________________________________  Should you have questions after your visit to [facility], please contact our office at [phone] between the hours of 8:00 a.m. and 4:30 p.m.  Voicemails left after 4:00 p.m. will not be returned until the following business day.  For prescription refill requests, have your pharmacy contact our office and allow 72 hours.    Cancer Center Support Programs:   > Cancer Support Group  2nd Tuesday of the month 1pm-2pm, Journey Room

## 2019-09-27 NOTE — Telephone Encounter (Signed)
09/26/19: left voicemail for patient to return my call.

## 2019-09-27 NOTE — Progress Notes (Signed)
Charles [name], [place] [address]   CLINIC:  Medical Oncology/Hematology  PCP:  [name], MD [address] / [place] [id number] [phone]   REASON FOR VISIT:  Follow-up for Stage IV Colon Cancer  CURRENT THERAPY: Regorafenib.  BRIEF ONCOLOGIC HISTORY:  Oncology History  Rectal cancer ([facility])  07/24/2010 Initial Diagnosis   Invasive adenocarcinoma of rectum   09/09/2010 Concurrent Chemotherapy   S/P radiation and concurrent 5-FU continuous infusion from 09/09/10- 10/10/10.   11/14/2010 Surgery   S/P proctectomy with colorectal anastomosis and diverting loop ileostomy on 11/14/10 at [facility] by Dr. Harlon Ditty. Pathology reveals a pT3b N1 with 3/20 lymph nodes.   02/05/2011 - 07/14/2011 Chemotherapy   FOLFOX   08/18/2011 Surgery   Approximate date of surgery- [place] by Dr. Harlon Ditty    Remission     11/24/2013 Survivorship   Colonoscopy- somewhat fibrotic/friable anastomotic mucosal-status post biopsy (narrowing not felt to be clinically significant).  Negative pathology for malignancy   09/12/2014 Imaging   DVT in the left femoral venous system, left common iliac vein, IVC, and within the IVC filter Right lower extremity venography confirms chronic occlusion of the femoral venous system with collateralization. The right iliac venous system is patent and do   09/25/2014 PET scan   The right middle lobe pulmonary nodule is hypermetabolic, favored to represent a primary bronchogenic carcinoma.Equivocal mediastinal nodes, similar to surrounding blood pool. Bilateral adrenal hypermetabolism, felt to be physiologic   10/24/2014 Pathology Results   Lung, needle/core biopsy(ies), RML - ADENOCARCINOMA, SEE COMMENT metastatic adenocarcinoma of a colorectal primary   10/24/2014 Relapse/Recurrence     11/16/2014 Definitive Surgery   Bronchoscopy, right video-assisted thoracoscopy, wedge resection of right middle lobe by Dr. Servando Snare   11/16/2014  Pathology Results   Lung, wedge biopsy/resection, right lingula and small portion of middle lobe - METASTATIC ADENOCARCINOMA, CONSISTENT WITH COLORECTAL PRIMARY, SPANNING 2.0 CM. - THE SURGICAL RESECTION MARGINS ARE NEGATIVE FOR ADENOCARCINOMA.   11/16/2014 Remission   THE SURGICAL RESECTION MARGINS ARE NEGATIVE FOR ADENOCARCINOMA.   03/07/2015 Imaging   CT CAP- Interval resection of right middle lobe metastasis. No acute process or evidence of metastatic disease in the chest, abdomen or pelvis. Improved right upper lobe reticular nodular opacities are favored to represent resolving infection.   09/14/2015 Imaging   CT CAP- No findings of recurrent malignancy. No recurrence along the wedge resection site of the right middle lobe. Right anterior abdominal wall focal hernia containing a knuckle of small bowel without complicating feature.   06/13/2016 Imaging   CT CAP- 1. New right-sided pleural metastasis/mass. Other smaller right-sided pulmonary nodules which are all pleural-based and most likely represent pleural metastasis. 2. No convincing evidence of abdominopelvic nodal metastasis. 3. Constellation of findings, including pancreatic atrophy, duct dilatation, and pancreatic head soft tissue fullness which are highly suspicious for pancreatic adenocarcinoma. Metastatic disease felt much less likely. Consider endoscopic ultrasound sampling or ERCP. Cannot exclude superimposed acute pancreatitis. 4. New enlargement of the appendiceal tip with subtle surrounding edema. Cannot exclude early or mild appendicitis. 5. Coronary artery atherosclerosis. Aortic atherosclerosis. 6. 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   06/13/2016 Progression   CT scan demonstrates progression of disease   06/19/2016 Procedure   EUS with FNA by Dr. Ardis Hughs   06/20/2016 Pathology Results   FINE NEEDLE ASPIRATION, ENDOSCOPIC, PANCREAS UNCINATE AREA(SPECIMEN 1 OF 1 COLLECTED  06/19/16): MALIGNANT CELLS CONSISTENT WITH METASTATIC ADENOCARCINOMA.  06/25/2016 PET scan   1. Two pleural-based nodules in the right hemithorax are hypermetabolic. Metastatic disease is a distinct consideration. The scattered pulmonary parenchymal nodule seen on previous diagnostic CT imaging are below the threshold for reliable resolution on PET imaging. 2. Hypermetabolic lesion pancreatic head, consistent with neoplasm. As noted on prior CT, adenocarcinoma is any consideration. No evidence for hypermetabolic abdominal lymphadenopathy. Mottled uptake noted in the liver, but no discrete hepatic metastases are evident on PET imaging. 3. Appendix remains distended up to 0.9-10 mm diameter with a stone towards the tip in subtle periappendiceal edema/inflammation. The appendix is hypermetabolic along its length. Imaging features are relatively stable in the 12 day interval since prior CT scan. While appendicitis is a consideration, the relative stability over 12 days would be unusual for that etiology. Continued close follow-up recommended.   06/26/2016 Pathology Results   Not enough tissue for foundationONE or K-ras testing.   07/07/2016 Procedure   Port placed by Dr. Arnoldo Morale   07/08/2016 -  Chemotherapy   The patient had pegfilgrastim (NEULASTA) injection 6 mg, 6 mg, Subcutaneous,  Once, 0 of 4 cycles  ondansetron (ZOFRAN) IVPB 8 mg, 8 mg, Intravenous,  Once, 0 of 4 cycles  leucovorin 804 mg in dextrose 5 % 250 mL infusion, 400 mg/m2, Intravenous,  Once, 0 of 4 cycles  oxaliplatin (ELOXATIN) 170 mg in dextrose 5 % 500 mL chemo infusion, 85 mg/m2, Intravenous,  Once, 0 of 4 cycles  fluorouracil (ADRUCIL) 4,800 mg in sodium chloride 0.9 % 150 mL chemo infusion, 2,400 mg/m2 = 4,800 mg, Intravenous, 1 Day/Dose, 0 of 4 cycles  fluorouracil (ADRUCIL) chemo injection 800 mg, 400 mg/m2, Intravenous,  Once, 0 of 4 cycles  pegfilgrastim (NEULASTA) injection 6 mg, 6 mg, Subcutaneous,  Once, 2  of 2 cycles  ondansetron (ZOFRAN) IVPB 8 mg, 8 mg, Intravenous,  Once, 12 of 12 cycles  leucovorin 660 mg in dextrose 5 % 250 mL infusion, 660 mg (original dose ), Intravenous,  Once, 12 of 12 cycles Dose modification: 660 mg (Cycle 1)  oxaliplatin (ELOXATIN) 140 mg in dextrose 5 % 500 mL chemo infusion, 140 mg (original dose ), Intravenous,  Once, 12 of 12 cycles Dose modification: 140 mg (Cycle 1), 119 mg (85 % of original dose 140 mg, Cycle 8, Reason: Provider Judgment)  fluorouracil (ADRUCIL) 3,850 mg in sodium chloride 0.9 % 150 mL chemo infusion, 3,850 mg (original dose ), Intravenous, 1 Day/Dose, 12 of 12 cycles Dose modification: 3,850 mg (Cycle 1)  fluorouracil (ADRUCIL) chemo injection 700 mg, 700 mg (original dose ), Intravenous,  Once, 12 of 12 cycles Dose modification: 700 mg (Cycle 1)  palonosetron (ALOXI) injection 0.25 mg, 0.25 mg, Intravenous,  Once, 7 of 12 cycles Administration: 0.25 mg (09/30/2016)  bevacizumab (AVASTIN) 475 mg in sodium chloride 0.9 % 100 mL chemo infusion, 5 mg/kg = 475 mg, Intravenous,  Once, 6 of 11 cycles Administration: 500 mg (09/30/2016)  leucovorin 880 mg in dextrose 5 % 250 mL infusion, 400 mg/m2 = 880 mg, Intravenous,  Once, 7 of 12 cycles Administration: 880 mg (09/30/2016)  oxaliplatin (ELOXATIN) 185 mg in dextrose 5 % 500 mL chemo infusion, 85 mg/m2 = 185 mg, Intravenous,  Once, 7 of 12 cycles Administration: 185 mg (09/30/2016)  fluorouracil (ADRUCIL) chemo injection 900 mg, 400 mg/m2 = 900 mg, Intravenous,  Once, 7 of 12 cycles Administration: 900 mg (09/30/2016)  fluorouracil (ADRUCIL) 5,300 mg in sodium chloride 0.9 % 144 mL chemo infusion, 2,400 mg/m2 = 5,300  mg, Intravenous, 1 Day/Dose, 7 of 12 cycles Administration: 5,300 mg (09/30/2016)  for chemotherapy treatment.     08/27/2016 Imaging   CT CAP- 1. Pulmonary metastatic disease is stable. 2. Pancreatic head mass, grossly stable, with progressive atrophy of the body and tail  of the pancreas. Stable peripancreatic lymph node. 3. Mild circumferential rectal wall thickening, stable. 4. Aortic atherosclerosis (ICD10-170.0). Coronary artery calcification. 5. Hyperattenuating lesion off the lower pole left kidney, stable, too small to characterize. Continued attention on followup exams is warranted. 6. Persistent wall thickening and mild dilatation of the proximal appendix, of uncertain etiology. Continued attention on followup exams is warranted.   11/06/2016 Imaging   CT C/A/P: IMPRESSION: 1. Stable pulmonary metastatic disease. 2. Similar appearing pancreatic head mass. Progressed atrophy of the pancreatic body and tail. Stable peripancreatic lymph node. 3. Stable hypoattenuating lesion partially exophytic off the inferior pole of the left kidney. 4. Aortic atherosclerosis. 5. Persistent mild wall thickening and mild dilatation of the appendix.    01/30/2017 Imaging   Ct C/A/P: IMPRESSION: 1. Stable pulmonary metastatic disease. 2. Stable to slight increase in size of pancreatic head mass. 3.  Aortic Atherosclerosis (ICD10-I70.0). 4. Similar appearance of mild wall thickening of the appendix which contains an appendicolith.   05/01/2017 Imaging   CT C/A/P: Stable disease in the lungs, no progression of disease in the abdomen.   10/04/2018 - 07/26/2019 Chemotherapy   The patient had palonosetron (ALOXI) injection 0.25 mg, 0.25 mg, Intravenous,  Once, 12 of 14 cycles Administration: 0.25 mg (10/04/2018), 0.25 mg (10/25/2018), 0.25 mg (12/01/2018), 0.25 mg (12/15/2018), 0.25 mg (12/29/2018), 0.25 mg (01/12/2019), 0.25 mg (01/26/2019), 0.25 mg (02/09/2019), 0.25 mg (05/11/2019), 0.25 mg (05/25/2019), 0.25 mg (06/08/2019), 0.25 mg (06/22/2019) pegfilgrastim (NEULASTA) injection 6 mg, 6 mg, Subcutaneous, Once, 2 of 2 cycles Administration: 6 mg (10/27/2018) pegfilgrastim-cbqv (UDENYCA) injection 6 mg, 6 mg, Subcutaneous, Once, 10 of 12 cycles Administration: 6 mg (12/03/2018),  6 mg (12/17/2018), 6 mg (12/31/2018), 6 mg (01/14/2019), 6 mg (01/28/2019), 6 mg (02/11/2019), 6 mg (05/13/2019), 6 mg (05/27/2019), 6 mg (06/10/2019), 6 mg (06/24/2019) bevacizumab (AVASTIN) 450 mg in sodium chloride 0.9 % 100 mL chemo infusion, 5 mg/kg = 450 mg, Intravenous,  Once, 4 of 4 cycles Administration: 400 mg (12/01/2018), 400 mg (12/15/2018), 400 mg (12/29/2018) irinotecan (CAMPTOSAR) 380 mg in sodium chloride 0.9 % 500 mL chemo infusion, 180 mg/m2 = 380 mg, Intravenous,  Once, 12 of 14 cycles Dose modification: 144 mg/m2 (original dose 180 mg/m2, Cycle 2, Reason: Provider Judgment, Comment: 20% dose reduction), 90 mg/m2 (50 % of original dose 180 mg/m2, Cycle 3, Reason: Other (see comments), Comment: [id number]) Administration: 380 mg (10/04/2018), 300 mg (10/25/2018), 200 mg (12/01/2018), 200 mg (12/15/2018), 200 mg (12/29/2018), 200 mg (01/12/2019), 200 mg (01/26/2019), 200 mg (02/09/2019), 180 mg (05/11/2019), 180 mg (05/25/2019), 180 mg (06/08/2019), 180 mg (06/22/2019) leucovorin 900 mg in sodium chloride 0.9 % 250 mL infusion, 421 mg/m2 = 856 mg, Intravenous,  Once, 12 of 14 cycles Dose modification: 320 mg/m2 (original dose 400 mg/m2, Cycle 2, Reason: Provider Judgment, Comment: 20% dose reduction) Administration: 900 mg (10/04/2018), 700 mg (10/25/2018), 700 mg (12/01/2018), 700 mg (12/15/2018), 700 mg (12/29/2018), 700 mg (01/12/2019), 700 mg (01/26/2019), 700 mg (02/09/2019), 700 mg (05/11/2019), 700 mg (05/25/2019), 700 mg (06/08/2019), 700 mg (06/22/2019) fluorouracil (ADRUCIL) chemo injection 850 mg, 400 mg/m2 = 850 mg, Intravenous,  Once, 12 of 14 cycles Dose modification: 320 mg/m2 (original dose 400 mg/m2, Cycle 2, Reason: Provider Judgment, Comment: 20%  dose reduction) Administration: 850 mg (10/04/2018), 700 mg (10/25/2018), 700 mg (12/01/2018), 700 mg (12/15/2018), 700 mg (12/29/2018), 700 mg (01/12/2019), 700 mg (01/26/2019), 700 mg (02/09/2019), 700 mg (05/11/2019), 700 mg (05/25/2019), 700 mg (06/08/2019), 700 mg  (06/22/2019) fosaprepitant (EMEND) 150 mg, dexamethasone (DECADRON) 12 mg in sodium chloride 0.9 % 145 mL IVPB, , Intravenous,  Once, 10 of 12 cycles Administration:  (12/01/2018),  (12/15/2018),  (12/29/2018),  (01/12/2019),  (01/26/2019),  (02/09/2019),  (05/11/2019),  (05/25/2019),  (06/08/2019),  (06/22/2019) fluorouracil (ADRUCIL) 5,000 mg in sodium chloride 0.9 % 150 mL chemo infusion, 2,325 mg/m2 = 5,150 mg, Intravenous, 1 Day/Dose, 12 of 14 cycles Dose modification: 1,920 mg/m2 (original dose 2,400 mg/m2, Cycle 2, Reason: Provider Judgment, Comment: 20% dose reduction) Administration: 5,000 mg (10/04/2018), 4,100 mg (10/25/2018), 4,100 mg (12/01/2018), 4,100 mg (12/15/2018), 4,100 mg (12/29/2018), 4,100 mg (01/12/2019), 4,100 mg (01/26/2019), 4,100 mg (02/09/2019), 4,100 mg (05/11/2019), 4,100 mg (05/25/2019), 4,100 mg (06/08/2019), 4,100 mg (06/22/2019) bevacizumab-bvzr (ZIRABEV) 400 mg in sodium chloride 0.9 % 100 mL chemo infusion, 5 mg/kg = 400 mg (100 % of original dose 5 mg/kg), Intravenous,  Once, 4 of 6 cycles Dose modification: 5 mg/kg (original dose 5 mg/kg, Cycle 6) Administration: 400 mg (01/12/2019), 400 mg (01/26/2019), 400 mg (02/09/2019)  for chemotherapy treatment.    08/01/2019 -  Chemotherapy   The patient had palonosetron (ALOXI) injection 0.25 mg, 0.25 mg, Intravenous,  Once, 3 of 4 cycles Administration: 0.25 mg (08/01/2019), 0.25 mg (09/13/2019), 0.25 mg (08/22/2019) pegfilgrastim-cbqv (UDENYCA) injection 6 mg, 6 mg, Subcutaneous, Once, 3 of 4 cycles Administration: 6 mg (08/03/2019), 6 mg (08/24/2019) leucovorin 628 mg in dextrose 5 % 250 mL infusion, 320 mg/m2 = 628 mg (80 % of original dose 400 mg/m2), Intravenous,  Once, 3 of 4 cycles Dose modification: 320 mg/m2 (80 % of original dose 400 mg/m2, Cycle 1, Reason: Change in LFTs), 280 mg/m2 (70 % of original dose 400 mg/m2, Cycle 3, Reason: Provider Judgment), 356.8616 mg/m2 (66.7 % of original dose 400 mg/m2, Cycle 2, Reason: Provider  Judgment) Administration: 628 mg (08/01/2019), 522 mg (09/13/2019), 522 mg (08/22/2019) oxaliplatin (ELOXATIN) 135 mg in dextrose 5 % 500 mL chemo infusion, 68 mg/m2 = 135 mg (80 % of original dose 85 mg/m2), Intravenous,  Once, 3 of 4 cycles Dose modification: 68 mg/m2 (80 % of original dose 85 mg/m2, Cycle 1, Reason: Change in LFTs), 59.5 mg/m2 (70 % of original dose 85 mg/m2, Cycle 3, Reason: Provider Judgment), 83.7290 mg/m2 (66.7 % of original dose 85 mg/m2, Cycle 2, Reason: Provider Judgment) Administration: 135 mg (08/01/2019), 110 mg (09/13/2019), 110 mg (08/22/2019) fluorouracil (ADRUCIL) chemo injection 650 mg, 320 mg/m2 = 650 mg (80 % of original dose 400 mg/m2), Intravenous,  Once, 3 of 4 cycles Dose modification: 320 mg/m2 (80 % of original dose 400 mg/m2, Cycle 1, Reason: Change in LFTs), 280 mg/m2 (70 % of original dose 400 mg/m2, Cycle 3, Reason: Provider Judgment), 211.1552 mg/m2 (66.7 % of original dose 400 mg/m2, Cycle 2, Reason: Provider Judgment) Administration: 650 mg (08/01/2019), 500 mg (08/22/2019) fluorouracil (ADRUCIL) 3,750 mg in sodium chloride 0.9 % 75 mL chemo infusion, 1,920 mg/m2 = 3,750 mg (80 % of original dose 2,400 mg/m2), Intravenous, 1 Day/Dose, 3 of 4 cycles Dose modification: 1,920 mg/m2 (80 % of original dose 2,400 mg/m2, Cycle 1, Reason: Change in LFTs), 1,680 mg/m2 (70 % of original dose 2,400 mg/m2, Cycle 3, Reason: Provider Judgment), 1,600 mg/m2 (66.7 % of original dose 2,400 mg/m2, Cycle 2, Reason: Provider  Judgment) Administration: 3,750 mg (08/01/2019), 3,150 mg (08/22/2019) bevacizumab-bvzr (ZIRABEV) 400 mg in sodium chloride 0.9 % 100 mL chemo infusion, 5 mg/kg = 400 mg, Intravenous,  Once, 1 of 2 cycles  for chemotherapy treatment.      CANCER STAGING: Cancer Staging Rectal cancer ([place]) Staging form: Colon and Rectum, AJCC 7th Edition - Clinical: Stage IIIB (T3, N1, M0) - Signed by [name], MD on 02/04/2011 - Pathologic stage from 11/17/2014:  Stage IVA (M1a) - Signed by Baird Cancer, PA-C on 09/14/2015   INTERVAL HISTORY:  Charles [name] 69 y.o. male returns for routine follow-up of his Stage IV Colon Cancer. Charles [name] was last seen on 09/21/2019.  Overall, he tells me he has been feeling pretty well. He notes that he got a call that he was approved for his prescription, but it was never mailed out to him. He is drinking boost to supplement his diet.   He continues to be in pain, however. He is taking Oxycodone every 5-6 hours. He needs a refill on his Oxycodone today. He also continues to take his gabapentin.   REVIEW OF SYSTEMS:  Review of Systems  Constitutional: Positive for appetite change (moderately decreased) and fatigue (moderate). Negative for chills and fever.  HENT:   Negative for lump/mass, mouth sores, sore throat and trouble swallowing.   Eyes: Negative for eye problems.  Respiratory: Negative for chest tightness, cough, shortness of breath and wheezing.   Cardiovascular: Positive for chest pain. Negative for palpitations.  Gastrointestinal: Negative for abdominal pain, constipation, diarrhea, nausea and vomiting.  Genitourinary: Negative for bladder incontinence, dysuria, frequency and hematuria.   Musculoskeletal: Negative for arthralgias, back pain, flank pain and myalgias.  Skin: Negative for rash.  Neurological: Positive for dizziness, headaches and numbness (hands and feet). Negative for light-headedness.  Hematological: Does not bruise/bleed easily.  Psychiatric/Behavioral: Negative for depression. The patient is not nervous/anxious.     PAST MEDICAL/SURGICAL HISTORY:  Past Medical History:  Diagnosis Date  . Chronic anticoagulation   . Colon cancer ([place]) 07/24/2010   rectal ca, inv adenocarcinoma  . Depression 04/01/2011  . Diabetes mellitus without complication ([place])   . DVT (deep venous thrombosis) ([facility]) 05/09/2011  . GERD (gastroesophageal reflux disease)   . High output ileostomy ([place])  05/21/2011  . History of kidney stones   . HTN (hypertension)   . Hx of radiation therapy 09/02/10 to 10/14/10   pelvis  . Lung metastasis ([place])   . Neuropathy   . Peripheral vascular disease ([place])    dvt's,pe  . Pneumonia    hx x3  . Pulmonary embolism ([place])   . Rectal cancer ([place]) 01/15/2011   S/P radiation and concurrent 5-FU continuous infusion from 09/09/10- 10/10/10.  S/P proctectomy with colorectal anastomosis and diverting loop ileostomy on 11/14/10 at [facility] by Dr. Harlon Ditty. Pathology reveals a pT3b N1 with 3/20 lymph nodes.     Past Surgical History:  Procedure Laterality Date  . BILIARY STENT PLACEMENT N/A 09/16/2018   Procedure: STENT PLACEMENT;  Surgeon: Rogene Houston, MD;  [facility];  Service: Endoscopy;  Laterality: N/A;  . BILIARY STENT PLACEMENT N/A 07/18/2019   Procedure: BILIARY STENT PLACEMENT;  Surgeon: Rogene Houston, MD;  [facility];  Service: Endoscopy;  Laterality: N/A;  . BIOPSY  09/24/2018   Procedure: BIOPSY;  Surgeon: Danie Binder, MD;  [facility];  Service: Endoscopy;;  . COLON SURGERY  11/14/2010   proctectomy with colorectal anastomosis  and diverting loop ileostomy (temporary planned)  . COLONOSCOPY  07/2010   proximal rectal apple core mass 10-14cm from anal verge (adenocarcinoma), 2-3cm distal rectal carpet polyp s/p piecemeal snare polypectomy (adenoma)  . COLONOSCOPY  04/21/2012   RMR: Friable,fibrotic appearing colorectal anastomosis producing some luminal narrowing-not felt to be critical. path: focal erosion with slight inflammation and hyperemia. SURVEILLANCE DUE DEC 2015  . COLONOSCOPY N/A 11/24/2013   Dr. Rourk:somewhat fibrotic/friable anastomotic mucosal-status post biopsy (narrowing not felt to be clinically significant) Single colonic diverticulum. benign polypoid rectal mucosa  . colostomy reversal  april 2013  . ERCP N/A 09/16/2018   Procedure: ENDOSCOPIC RETROGRADE CHOLANGIOPANCREATOGRAPHY  (ERCP);  Surgeon: Rogene Houston, MD;  [facility];  Service: Endoscopy;  Laterality: N/A;  . ERCP N/A 07/18/2019   Procedure: ENDOSCOPIC RETROGRADE CHOLANGIOPANCREATOGRAPHY (ERCP);  Surgeon: Rogene Houston, MD;  [facility];  Service: Endoscopy;  Laterality: N/A;  . ESOPHAGOGASTRODUODENOSCOPY  07/2010   RMR: schatki ring s/p dilation, small hh, SB bx benign  . ESOPHAGOGASTRODUODENOSCOPY N/A 09/04/2015   Procedure: ESOPHAGOGASTRODUODENOSCOPY (EGD);  Surgeon: Daneil Dolin, MD;  [facility];  Service: Endoscopy;  Laterality: N/A;  730  . EUS  08/2010   Dr. Owens Loffler. uT3N0 circumferential, nearly obstruction rectosigmoid adenocarcinoma, distal edge 12cm from anal verge  . EUS N/A 06/19/2016   Procedure: UPPER ENDOSCOPIC ULTRASOUND (EUS) RADIAL;  Surgeon: Milus Banister, MD;  [facility]: [facility];  Service: Endoscopy;  Laterality: N/A;  . FLEXIBLE SIGMOIDOSCOPY N/A 09/24/2018   Procedure: FLEXIBLE SIGMOIDOSCOPY;  Surgeon: Danie Binder, MD;  [facility];  Service: Endoscopy;  Laterality: N/A;  . HERNIA REPAIR     abd hernia repair  . IVC filter    . ivc filter    . port a cath placement    . PORT-A-CATH REMOVAL  09/24/2011   Procedure: REMOVAL PORT-A-CATH;  Surgeon: Donato Heinz, MD;  Location: [facility];  Service: General;  Laterality: N/A;  Minor Room  . PORTACATH PLACEMENT Right 07/07/2016   Procedure: INSERTION PORT-A-CATH;  Surgeon: Aviva Signs, MD;  Location: [facility];  Service: General;  Laterality: Right;  . SPHINCTEROTOMY N/A 09/16/2018   Procedure: SPHINCTEROTOMY with balloon dialation;  Surgeon: Rogene Houston, MD;  [facility];  Service: Endoscopy;  Laterality: N/A;  . TRANSVERSE LOOP COLOSTOMY N/A 09/29/2018   Procedure: TRANSVERSE LOOP COLOSTOMY;  Surgeon: Aviva Signs, MD;  Location: [facility];  Service: General;  Laterality: N/A;  . VIDEO ASSISTED THORACOSCOPY (VATS)/WEDGE RESECTION Right 11/15/2014   Procedure:  VIDEO ASSISTED THORACOSCOPY (VATS)/LUNG RESECTION WITH RIGHT LINGULECTOMY;  Surgeon: [name], MD;  Location: [facility];  Service: Thoracic;  Laterality: Right;  Marland Kitchen VIDEO BRONCHOSCOPY N/A 11/15/2014   Procedure: VIDEO BRONCHOSCOPY;  Surgeon: [name], MD;  Location: [facility];  Service: Thoracic;  Laterality: N/A;    SOCIAL HISTORY:  Social History   Socioeconomic History  . Marital status: Married    Spouse name: Not on file  . Number of children: 2  . Years of education: Not on file  . Highest education level: Not on file  Occupational History  . Occupation: self-employed Regulatory affairs officer, currently not working    Fish farm manager: SELF EMPLOYED  Tobacco Use  . Smoking status: Never Smoker  . Smokeless tobacco: Never Used  Substance and Sexual Activity  . Alcohol use: No  . Drug use: No  . Sexual activity: Yes    Birth control/protection: None  Comment: married  Other Topics Concern  . Not on file  Social History Narrative  . Not on file   Social Determinants of Health   Financial Resource Strain: Low Risk   . Difficulty of Paying Living Expenses: Not very hard  Food Insecurity: No Food Insecurity  . Worried About Charity fundraiser in the Last Year: Never true  . Ran Out of Food in the Last Year: Never true  Transportation Needs: No Transportation Needs  . Lack of Transportation (Medical): No  . Lack of Transportation (Non-Medical): No  Physical Activity: Inactive  . Days of Exercise per Week: 0 days  . Minutes of Exercise per Session: 0 min  Stress: No Stress Concern Present  . Feeling of Stress : Only a little  Social Connections: Unknown  . Frequency of Communication with Friends and Family: More than three times a week  . Frequency of Social Gatherings with Friends and Family: Once a week  . Attends Religious Services: Patient refused  . Active Member of Clubs or Organizations: Patient refused  . Attends Archivist Meetings: Patient  refused  . Marital Status: Patient refused  Intimate Partner Violence: Unknown  . Fear of Current or Ex-Partner: Patient refused  . Emotionally Abused: Patient refused  . Physically Abused: Patient refused  . Sexually Abused: Patient refused    FAMILY HISTORY:  Family History  Problem Relation Age of Onset  . Cancer Brother        throat  . Cancer Brother        prostate  . Colon cancer Neg Hx   . Liver disease Neg Hx   . Inflammatory bowel disease Neg Hx     CURRENT MEDICATIONS:  Current Outpatient Medications  Medication Sig Dispense Refill  . acetaminophen (TYLENOL) 500 MG tablet Take 1,000 mg by mouth every 6 (six) hours as needed for mild pain or moderate pain.     . clonazePAM (KLONOPIN) 1 MG tablet Take 1 tablet (1 mg total) by mouth 3 (three) times daily as needed. (Patient not taking: Reported on 09/21/2019) 90 tablet 0  . gabapentin (NEURONTIN) 300 MG capsule Take 3 capsules (900 mg total) by mouth 2 (two) times daily. 540 capsule 0  . glimepiride (AMARYL) 2 MG tablet Take 2 mg by mouth 2 (two) times daily before a meal.     . magic mouthwash w/lidocaine SOLN Take 15 mLs by mouth in the morning and at bedtime. 480 mL 2  . magnesium oxide (MAG-OX) 400 (241.3 Mg) MG tablet Take 1 tablet (400 mg total) by mouth 3 (three) times daily. 180 tablet 3  . megestrol (MEGACE) 400 MG/10ML suspension Take 10 mLs (400 mg total) by mouth 2 (two) times daily. 480 mL 2  . mirtazapine (REMERON) 15 MG tablet Take 1 tablet (15 mg total) by mouth at bedtime. 30 tablet 3  . oxyCODONE (ROXICODONE) 15 MG immediate release tablet Take 1 tablet (15 mg total) by mouth every 4 (four) hours as needed. for pain 60 tablet 0  . pantoprazole (PROTONIX) 40 MG tablet Take 40 mg by mouth every evening.     . Probiotic Product (PROBIOTIC DAILY PO) Take 1 capsule by mouth 2 (two) times daily.     . prochlorperazine (COMPAZINE) 10 MG tablet Take 1 tablet (10 mg total) by mouth every 6 (six) hours as needed for  nausea or vomiting. (Patient not taking: Reported on 09/21/2019) 30 tablet 3  . regorafenib (STIVARGA) 40 MG tablet Take 80  mg by mouth daily with breakfast. Take for 21 days, then hold for 7 days. Repeat every 28 days. Take with low fat meal.    . rivaroxaban (XARELTO) 20 MG TABS tablet Take 1 tablet (20 mg total) by mouth daily with supper. 30 tablet 3  . vitamin B-12 (CYANOCOBALAMIN) 1000 MCG tablet Take 1,000 mcg by mouth daily.     No current facility-administered medications for this visit.   Facility-Administered Medications Ordered in Other Visits  Medication Dose Route Frequency Provider Last Rate Last Admin  . sodium chloride 0.9 % injection 10 mL  10 mL Intravenous PRN Everardo All, MD   10 mL at 08/22/11 1114  . sodium chloride flush (NS) 0.9 % injection 10 mL  10 mL Intracatheter PRN Ardath Sax, MD   10 mL at 05/08/17 1127    ALLERGIES:  Allergies  Allergen Reactions  . Oxaliplatin Other (See Comments)    Chest pain  . Oxycodone     Blisters, hallucinations (takes Percocet at home)  . Tramadol     Blisters, hallucinations  . Trazodone And Nefazodone Other (See Comments)    hallucinations    PHYSICAL EXAM:  Performance status (ECOG): 1 - Symptomatic but completely ambulatory  There were no vitals filed for this visit. Wt Readings from Last 3 Encounters:  09/21/19 149 lb 9.6 oz (67.9 kg)  09/13/19 148 lb 11.2 oz (67.4 kg)  09/05/19 151 lb 9.6 oz (68.8 kg)   Physical Exam Vitals reviewed.  Constitutional:      Appearance: Normal appearance.  HENT:     Head: Normocephalic.  Neurological:     General: No focal deficit present.     Mental Status: He is alert and oriented to person, place, and time.  Psychiatric:        Mood and Affect: Mood normal.        Behavior: Behavior normal.     LABORATORY DATA:  I have reviewed the labs as listed.  CBC Latest Ref Rng & Units 09/22/2019 09/13/2019 09/05/2019  WBC 4.0 - 10.5 K/uL 7.2 14.0(H) 13.3(H)  Hemoglobin  13.0 - 17.0 g/dL 11.1(L) 11.7(L) 12.0(L)  Hematocrit 39.0 - 52.0 % 36.3(L) 37.8(L) 37.7(L)  Platelets 150 - 400 K/uL 279 272 270   CMP Latest Ref Rng & Units 09/22/2019 09/13/2019 09/05/2019  Glucose 70 - 99 mg/dL 98 204(H) 188(H)  BUN 8 - 23 mg/dL _0 Creatinine 0.61 - 1.24 mg/dL 0.64 0.51(L) 0.59(L)  Sodium 135 - 145 mmol/L 133(L) 135 134(L)  Potassium 3.5 - 5.1 mmol/L 3.9 3.7 3.5  Chloride 98 - 111 mmol/L 99 103 100  CO2 22 - 32 mmol/L _1 Calcium 8.9 - 10.3 mg/dL 8.5(L) 8.7(L) 8.6(L)  Total Protein 6.5 - 8.1 g/dL 6.3(L) 6.3(L) 6.2(L)  Total Bilirubin 0.3 - 1.2 mg/dL 0.9 1.0 1.1  Alkaline Phos 38 - 126 U/L 175(H) 218(H) 232(H)  AST 15 - 41 U/L _2 ALT 0 - 44 U/L _3 DIAGNOSTIC IMAGING:  I have reviewed scans.  ASSESSMENT & PLAN:  1.  Stage IV rectal cancer with lung and pancreatic metastasis: -Last chemotherapy with FOLFOX was on 08/12/2019. -CEA on 09/22/2019 was 80.8. -We reviewed CT from 09/22/2019 which showed progression of the right pulmonary meta stasis, trace right pleural effusion, progressive hepatic metastasis, progressive pancreatic meta stasis.  Bone meta stasis involving the right posterior rib and right inferior sternum grossly unchanged. -I have recommended Stivarga 80  mg daily 3 weeks on/1 week off for the first cycle.  If he tolerates it, we will increase it to 120 mg daily during cycle 2. -We have sent a prescription to specialty pharmacy.  He has not received the pill yet. -I plan to see him back in 2 weeks for follow-up.  2.  Weight loss: -Continue Megace 10 mL twice daily.  His weight has been stable since last visit.  He is drinking boost +4 to 5 cans a day.  3.  Right lateral chest wall pain: -He is requiring oxycodone 15 mg every 4-5 hours.  4.  Neuropathy: -Continue gabapentin 900 mg twice daily.  5.  Anxiety: -Continue Klonopin 1 mg twice daily as needed.  6.  Hypomagnesemia: -Continue magnesium twice daily.  7.  DVT and  PE: -Continue Xarelto.  No bleeding issues.  8.  Sleeping difficulty: -Continue Remeron 15 mg daily.     Orders placed this encounter:  No orders of the defined types were placed in this encounter.    Derek Jack, MD, 09/27/19 8:10 AM  [place] [phone]   I, Jacqualyn Posey, am acting as a scribe for Dr. Sanda Linger.  I, Derek Jack MD, have reviewed the above documentation for accuracy and completeness, and I agree with the above.

## 2019-09-28 ENCOUNTER — Telehealth (HOSPITAL_COMMUNITY): Payer: Self-pay | Admitting: Pharmacy Technician

## 2019-09-28 NOTE — Telephone Encounter (Signed)
Oral Oncology Patient Advocate Encounter  Coordinated with staff at [facility] for patient to stop by office to sign [employer] [place] PAF application in an effort to reduce patient's out of pocket expense for Stivarga to $0.    Application completed and faxed to [phone].   [employer] [place] patient assistance phone number for follow up is [phone].   This encounter will be updated until final determination.   [place] Patient [place] Phone [phone] Fax [phone] 09/28/2019 3:14 PM

## 2019-09-29 ENCOUNTER — Encounter (HOSPITAL_COMMUNITY): Payer: Medicare Other

## 2019-09-29 NOTE — Telephone Encounter (Addendum)
Oral Oncology Patient Advocate Encounter  Received notification from [employer] [place] [employer] that patient has been successfully enrolled into their program to receive Stivarga from the manufacturer at $0 out of pocket until 05/04/20.    I called and spoke with patient.  He knows we will have to re-apply.   Specialty Pharmacy that will dispense medication is [facility]).  Patient knows to call the office with questions or concerns.   Oral Oncology Clinic will continue to follow.  [place] Patient [place] Phone [phone] Fax [phone] 09/29/2019 10:00 AM

## 2019-10-02 DIAGNOSIS — I10 Essential (primary) hypertension: Secondary | ICD-10-CM | POA: Diagnosis not present

## 2019-10-02 DIAGNOSIS — E119 Type 2 diabetes mellitus without complications: Secondary | ICD-10-CM | POA: Diagnosis not present

## 2019-10-10 ENCOUNTER — Inpatient Hospital Stay (HOSPITAL_BASED_OUTPATIENT_CLINIC_OR_DEPARTMENT_OTHER): Payer: Medicare Other | Admitting: Hematology

## 2019-10-10 ENCOUNTER — Other Ambulatory Visit: Payer: Self-pay

## 2019-10-10 ENCOUNTER — Encounter (HOSPITAL_COMMUNITY): Payer: Self-pay | Admitting: *Deleted

## 2019-10-10 ENCOUNTER — Encounter (HOSPITAL_COMMUNITY): Payer: Self-pay

## 2019-10-10 ENCOUNTER — Other Ambulatory Visit (HOSPITAL_COMMUNITY): Payer: Self-pay | Admitting: *Deleted

## 2019-10-10 ENCOUNTER — Inpatient Hospital Stay (HOSPITAL_COMMUNITY): Payer: Medicare Other | Attending: Hematology

## 2019-10-10 VITALS — BP 110/50 | HR 95 | Temp 97.7°F | Resp 18 | Wt 146.9 lb

## 2019-10-10 DIAGNOSIS — C787 Secondary malignant neoplasm of liver and intrahepatic bile duct: Secondary | ICD-10-CM | POA: Insufficient documentation

## 2019-10-10 DIAGNOSIS — C20 Malignant neoplasm of rectum: Secondary | ICD-10-CM | POA: Diagnosis not present

## 2019-10-10 DIAGNOSIS — C7889 Secondary malignant neoplasm of other digestive organs: Secondary | ICD-10-CM | POA: Insufficient documentation

## 2019-10-10 DIAGNOSIS — C7800 Secondary malignant neoplasm of unspecified lung: Secondary | ICD-10-CM | POA: Insufficient documentation

## 2019-10-10 DIAGNOSIS — E860 Dehydration: Secondary | ICD-10-CM

## 2019-10-10 DIAGNOSIS — R112 Nausea with vomiting, unspecified: Secondary | ICD-10-CM | POA: Insufficient documentation

## 2019-10-10 LAB — COMPREHENSIVE METABOLIC PANEL
ALT: 25 U/L (ref 0–44)
AST: 31 U/L (ref 15–41)
Albumin: 2.8 g/dL — ABNORMAL LOW (ref 3.5–5.0)
Alkaline Phosphatase: 124 U/L (ref 38–126)
Anion gap: 11 (ref 5–15)
BUN: 11 mg/dL (ref 8–23)
CO2: 25 mmol/L (ref 22–32)
Calcium: 8.4 mg/dL — ABNORMAL LOW (ref 8.9–10.3)
Chloride: 96 mmol/L — ABNORMAL LOW (ref 98–111)
Creatinine, Ser: 0.62 mg/dL (ref 0.61–1.24)
GFR calc Af Amer: >60 mL/min (ref >60)
GFR calc non Af Amer: >60 mL/min (ref >60)
Glucose, Bld: 155 mg/dL — ABNORMAL HIGH (ref 70–99)
Potassium: 3.9 mmol/L (ref 3.5–5.1)
Sodium: 132 mmol/L — ABNORMAL LOW (ref 135–145)
Total Bilirubin: 2.6 mg/dL — ABNORMAL HIGH (ref 0.3–1.2)
Total Protein: 6.5 g/dL (ref 6.5–8.1)

## 2019-10-10 LAB — CBC WITH DIFFERENTIAL/PLATELET
Abs Immature Granulocytes: 0.04 10*3/uL (ref 0.00–0.07)
Basophils Absolute: 0 10*3/uL (ref 0.0–0.1)
Basophils Relative: 0 %
Eosinophils Absolute: 0 10*3/uL (ref 0.0–0.5)
Eosinophils Relative: 0 %
HCT: 35.9 % — ABNORMAL LOW (ref 39.0–52.0)
Hemoglobin: 11.6 g/dL — ABNORMAL LOW (ref 13.0–17.0)
Immature Granulocytes: 0 %
Lymphocytes Relative: 10 %
Lymphs Abs: 0.9 10*3/uL (ref 0.7–4.0)
MCH: 27.7 pg (ref 26.0–34.0)
MCHC: 32.3 g/dL (ref 30.0–36.0)
MCV: 85.7 fL (ref 80.0–100.0)
Monocytes Absolute: 0.9 10*3/uL (ref 0.1–1.0)
Monocytes Relative: 10 %
Neutro Abs: 7.8 10*3/uL — ABNORMAL HIGH (ref 1.7–7.7)
Neutrophils Relative %: 80 %
Platelets: 187 10*3/uL (ref 150–400)
RBC: 4.19 MIL/uL — ABNORMAL LOW (ref 4.22–5.81)
RDW: 14.4 % (ref 11.5–15.5)
WBC: 9.7 10*3/uL (ref 4.0–10.5)
nRBC: 0 % (ref 0.0–0.2)

## 2019-10-10 LAB — MAGNESIUM: Magnesium: 1.7 mg/dL (ref 1.7–2.4)

## 2019-10-10 LAB — BILIRUBIN, DIRECT: Bilirubin, Direct: 0.4 mg/dL — ABNORMAL HIGH (ref 0.0–0.2)

## 2019-10-10 MED ORDER — ONDANSETRON HCL 4 MG/2ML IJ SOLN
8.0000 mg | Freq: Once | INTRAMUSCULAR | Status: AC
  Administered 2019-10-10: 8 mg via INTRAVENOUS
  Filled 2019-10-10: qty 4

## 2019-10-10 MED ORDER — OXYCODONE HCL ER 40 MG PO T12A: 40.0000 mg | EXTENDED_RELEASE_TABLET | Freq: Two times a day (BID) | ORAL | 0 refills | Status: DC

## 2019-10-10 MED ORDER — SCOPOLAMINE 1 MG/3DAYS TD PT72: 1.0000 | MEDICATED_PATCH | TRANSDERMAL | 12 refills | Status: AC

## 2019-10-10 MED ORDER — SODIUM CHLORIDE 0.9% FLUSH
10.0000 mL | Freq: Once | INTRAVENOUS | Status: AC | PRN
  Administered 2019-10-10: 10 mL

## 2019-10-10 MED ORDER — SODIUM CHLORIDE 0.9 % IV SOLN
Freq: Once | INTRAVENOUS | Status: AC
  Filled 2019-10-10: qty 1000

## 2019-10-10 MED ORDER — ONDANSETRON HCL 4 MG/2ML IJ SOLN
INTRAMUSCULAR | Status: AC
  Filled 2019-10-10: qty 2

## 2019-10-10 MED ORDER — HEPARIN SOD (PORK) LOCK FLUSH 100 UNIT/ML IV SOLN
500.0000 [IU] | Freq: Once | INTRAVENOUS | Status: AC | PRN
  Administered 2019-10-10: 500 [IU]

## 2019-10-10 MED ORDER — SCOPOLAMINE 1 MG/3DAYS TD PT72
1.0000 | MEDICATED_PATCH | Freq: Once | TRANSDERMAL | Status: DC
  Administered 2019-10-10: 1.5 mg via TRANSDERMAL
  Filled 2019-10-10: qty 1

## 2019-10-10 NOTE — Progress Notes (Addendum)
Charles [name], Charles [name] [id number]   CLINIC:  Medical Oncology/Hematology  PCP:  [name], MD [address] / [place] [address] [phone]   REASON FOR VISIT:  Follow-up for stage IV Colon Cancer  CURRENT THERAPY: Regorafenib  BRIEF ONCOLOGIC HISTORY:  Oncology History  Rectal cancer ([place])  07/24/2010 Initial Diagnosis   Invasive adenocarcinoma of rectum   09/09/2010 Concurrent Chemotherapy   S/P radiation and concurrent 5-FU continuous infusion from 09/09/10- 10/10/10.   11/14/2010 Surgery   S/P proctectomy with colorectal anastomosis and diverting loop ileostomy on 11/14/10 at [facility] by Dr. Harlon Ditty. Pathology reveals a pT3b N1 with 3/20 lymph nodes.   02/05/2011 - 07/14/2011 Chemotherapy   FOLFOX   08/18/2011 Surgery   Approximate date of surgery- [place] by Dr. Harlon Ditty    Remission     11/24/2013 Survivorship   Colonoscopy- somewhat fibrotic/friable anastomotic mucosal-status post biopsy (narrowing not felt to be clinically significant).  Negative pathology for malignancy   09/12/2014 Imaging   DVT in the left femoral venous system, left common iliac vein, IVC, and within the IVC filter Right lower extremity venography confirms chronic occlusion of the femoral venous system with collateralization. The right iliac venous system is patent and do   09/25/2014 PET scan   The right middle lobe pulmonary nodule is hypermetabolic, favored to represent a primary bronchogenic carcinoma.Equivocal mediastinal nodes, similar to surrounding blood pool. Bilateral adrenal hypermetabolism, felt to be physiologic   10/24/2014 Pathology Results   Lung, needle/core biopsy(ies), RML - ADENOCARCINOMA, SEE COMMENT metastatic adenocarcinoma of a colorectal primary   10/24/2014 Relapse/Recurrence     11/16/2014 Definitive Surgery   Bronchoscopy, right video-assisted thoracoscopy, wedge resection of right middle lobe by Dr. Servando Snare   11/16/2014  Pathology Results   Lung, wedge biopsy/resection, right lingula and small portion of middle lobe - METASTATIC ADENOCARCINOMA, CONSISTENT WITH COLORECTAL PRIMARY, SPANNING 2.0 CM. - THE SURGICAL RESECTION MARGINS ARE NEGATIVE FOR ADENOCARCINOMA.   11/16/2014 Remission   THE SURGICAL RESECTION MARGINS ARE NEGATIVE FOR ADENOCARCINOMA.   03/07/2015 Imaging   CT CAP- Interval resection of right middle lobe metastasis. No acute process or evidence of metastatic disease in the chest, abdomen or pelvis. Improved right upper lobe reticular nodular opacities are favored to represent resolving infection.   09/14/2015 Imaging   CT CAP- No findings of recurrent malignancy. No recurrence along the wedge resection site of the right middle lobe. Right anterior abdominal wall focal hernia containing a knuckle of small bowel without complicating feature.   06/13/2016 Imaging   CT CAP- 1. New right-sided pleural metastasis/mass. Other smaller right-sided pulmonary nodules which are all pleural-based and most likely represent pleural metastasis. 2. No convincing evidence of abdominopelvic nodal metastasis. 3. Constellation of findings, including pancreatic atrophy, duct dilatation, and pancreatic head soft tissue fullness which are highly suspicious for pancreatic adenocarcinoma. Metastatic disease felt much less likely. Consider endoscopic ultrasound sampling or ERCP. Cannot exclude superimposed acute pancreatitis. 4. New enlargement of the appendiceal tip with subtle surrounding edema. Cannot exclude early or mild appendicitis. 5. Coronary artery atherosclerosis. Aortic atherosclerosis. 6. 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   06/13/2016 Progression   CT scan demonstrates progression of disease   06/19/2016 Procedure   EUS with FNA by Dr. Ardis Hughs   06/20/2016 Pathology Results   FINE NEEDLE ASPIRATION, ENDOSCOPIC, PANCREAS UNCINATE AREA(SPECIMEN 1 OF 1 COLLECTED  06/19/16): MALIGNANT CELLS CONSISTENT WITH METASTATIC ADENOCARCINOMA.  06/25/2016 PET scan   1. Two pleural-based nodules in the right hemithorax are hypermetabolic. Metastatic disease is a distinct consideration. The scattered pulmonary parenchymal nodule seen on previous diagnostic CT imaging are below the threshold for reliable resolution on PET imaging. 2. Hypermetabolic lesion pancreatic head, consistent with neoplasm. As noted on prior CT, adenocarcinoma is any consideration. No evidence for hypermetabolic abdominal lymphadenopathy. Mottled uptake noted in the liver, but no discrete hepatic metastases are evident on PET imaging. 3. Appendix remains distended up to 0.9-10 mm diameter with a stone towards the tip in subtle periappendiceal edema/inflammation. The appendix is hypermetabolic along its length. Imaging features are relatively stable in the 12 day interval since prior CT scan. While appendicitis is a consideration, the relative stability over 12 days would be unusual for that etiology. Continued close follow-up recommended.   06/26/2016 Pathology Results   Not enough tissue for foundationONE or K-ras testing.   07/07/2016 Procedure   Port placed by Dr. Arnoldo Morale   07/08/2016 -  Chemotherapy   The patient had pegfilgrastim (NEULASTA) injection 6 mg, 6 mg, Subcutaneous,  Once, 0 of 4 cycles  ondansetron (ZOFRAN) IVPB 8 mg, 8 mg, Intravenous,  Once, 0 of 4 cycles  leucovorin 804 mg in dextrose 5 % 250 mL infusion, 400 mg/m2, Intravenous,  Once, 0 of 4 cycles  oxaliplatin (ELOXATIN) 170 mg in dextrose 5 % 500 mL chemo infusion, 85 mg/m2, Intravenous,  Once, 0 of 4 cycles  fluorouracil (ADRUCIL) 4,800 mg in sodium chloride 0.9 % 150 mL chemo infusion, 2,400 mg/m2 = 4,800 mg, Intravenous, 1 Day/Dose, 0 of 4 cycles  fluorouracil (ADRUCIL) chemo injection 800 mg, 400 mg/m2, Intravenous,  Once, 0 of 4 cycles  pegfilgrastim (NEULASTA) injection 6 mg, 6 mg, Subcutaneous,  Once, 2  of 2 cycles  ondansetron (ZOFRAN) IVPB 8 mg, 8 mg, Intravenous,  Once, 12 of 12 cycles  leucovorin 660 mg in dextrose 5 % 250 mL infusion, 660 mg (original dose ), Intravenous,  Once, 12 of 12 cycles Dose modification: 660 mg (Cycle 1)  oxaliplatin (ELOXATIN) 140 mg in dextrose 5 % 500 mL chemo infusion, 140 mg (original dose ), Intravenous,  Once, 12 of 12 cycles Dose modification: 140 mg (Cycle 1), 119 mg (85 % of original dose 140 mg, Cycle 8, Reason: Provider Judgment)  fluorouracil (ADRUCIL) 3,850 mg in sodium chloride 0.9 % 150 mL chemo infusion, 3,850 mg (original dose ), Intravenous, 1 Day/Dose, 12 of 12 cycles Dose modification: 3,850 mg (Cycle 1)  fluorouracil (ADRUCIL) chemo injection 700 mg, 700 mg (original dose ), Intravenous,  Once, 12 of 12 cycles Dose modification: 700 mg (Cycle 1)  palonosetron (ALOXI) injection 0.25 mg, 0.25 mg, Intravenous,  Once, 7 of 12 cycles Administration: 0.25 mg (09/30/2016)  bevacizumab (AVASTIN) 475 mg in sodium chloride 0.9 % 100 mL chemo infusion, 5 mg/kg = 475 mg, Intravenous,  Once, 6 of 11 cycles Administration: 500 mg (09/30/2016)  leucovorin 880 mg in dextrose 5 % 250 mL infusion, 400 mg/m2 = 880 mg, Intravenous,  Once, 7 of 12 cycles Administration: 880 mg (09/30/2016)  oxaliplatin (ELOXATIN) 185 mg in dextrose 5 % 500 mL chemo infusion, 85 mg/m2 = 185 mg, Intravenous,  Once, 7 of 12 cycles Administration: 185 mg (09/30/2016)  fluorouracil (ADRUCIL) chemo injection 900 mg, 400 mg/m2 = 900 mg, Intravenous,  Once, 7 of 12 cycles Administration: 900 mg (09/30/2016)  fluorouracil (ADRUCIL) 5,300 mg in sodium chloride 0.9 % 144 mL chemo infusion, 2,400 mg/m2 = 5,300  mg, Intravenous, 1 Day/Dose, 7 of 12 cycles Administration: 5,300 mg (09/30/2016)  for chemotherapy treatment.     08/27/2016 Imaging   CT CAP- 1. Pulmonary metastatic disease is stable. 2. Pancreatic head mass, grossly stable, with progressive atrophy of the body and tail  of the pancreas. Stable peripancreatic lymph node. 3. Mild circumferential rectal wall thickening, stable. 4. Aortic atherosclerosis (ICD10-170.0). Coronary artery calcification. 5. Hyperattenuating lesion off the lower pole left kidney, stable, too small to characterize. Continued attention on followup exams is warranted. 6. Persistent wall thickening and mild dilatation of the proximal appendix, of uncertain etiology. Continued attention on followup exams is warranted.   11/06/2016 Imaging   CT C/A/P: IMPRESSION: 1. Stable pulmonary metastatic disease. 2. Similar appearing pancreatic head mass. Progressed atrophy of the pancreatic body and tail. Stable peripancreatic lymph node. 3. Stable hypoattenuating lesion partially exophytic off the inferior pole of the left kidney. 4. Aortic atherosclerosis. 5. Persistent mild wall thickening and mild dilatation of the appendix.    01/30/2017 Imaging   Ct C/A/P: IMPRESSION: 1. Stable pulmonary metastatic disease. 2. Stable to slight increase in size of pancreatic head mass. 3.  Aortic Atherosclerosis (ICD10-I70.0). 4. Similar appearance of mild wall thickening of the appendix which contains an appendicolith.   05/01/2017 Imaging   CT C/A/P: Stable disease in the lungs, no progression of disease in the abdomen.   10/04/2018 - 07/26/2019 Chemotherapy   The patient had palonosetron (ALOXI) injection 0.25 mg, 0.25 mg, Intravenous,  Once, 12 of 14 cycles Administration: 0.25 mg (10/04/2018), 0.25 mg (10/25/2018), 0.25 mg (12/01/2018), 0.25 mg (12/15/2018), 0.25 mg (12/29/2018), 0.25 mg (01/12/2019), 0.25 mg (01/26/2019), 0.25 mg (02/09/2019), 0.25 mg (05/11/2019), 0.25 mg (05/25/2019), 0.25 mg (06/08/2019), 0.25 mg (06/22/2019) pegfilgrastim (NEULASTA) injection 6 mg, 6 mg, Subcutaneous, Once, 2 of 2 cycles Administration: 6 mg (10/27/2018) pegfilgrastim-cbqv (UDENYCA) injection 6 mg, 6 mg, Subcutaneous, Once, 10 of 12 cycles Administration: 6 mg (12/03/2018),  6 mg (12/17/2018), 6 mg (12/31/2018), 6 mg (01/14/2019), 6 mg (01/28/2019), 6 mg (02/11/2019), 6 mg (05/13/2019), 6 mg (05/27/2019), 6 mg (06/10/2019), 6 mg (06/24/2019) bevacizumab (AVASTIN) 450 mg in sodium chloride 0.9 % 100 mL chemo infusion, 5 mg/kg = 450 mg, Intravenous,  Once, 4 of 4 cycles Administration: 400 mg (12/01/2018), 400 mg (12/15/2018), 400 mg (12/29/2018) irinotecan (CAMPTOSAR) 380 mg in sodium chloride 0.9 % 500 mL chemo infusion, 180 mg/m2 = 380 mg, Intravenous,  Once, 12 of 14 cycles Dose modification: 144 mg/m2 (original dose 180 mg/m2, Cycle 2, Reason: Provider Judgment, Comment: 20% dose reduction), 90 mg/m2 (50 % of original dose 180 mg/m2, Cycle 3, Reason: Other (see comments), Comment: [id number]) Administration: 380 mg (10/04/2018), 300 mg (10/25/2018), 200 mg (12/01/2018), 200 mg (12/15/2018), 200 mg (12/29/2018), 200 mg (01/12/2019), 200 mg (01/26/2019), 200 mg (02/09/2019), 180 mg (05/11/2019), 180 mg (05/25/2019), 180 mg (06/08/2019), 180 mg (06/22/2019) leucovorin 900 mg in sodium chloride 0.9 % 250 mL infusion, 421 mg/m2 = 856 mg, Intravenous,  Once, 12 of 14 cycles Dose modification: 320 mg/m2 (original dose 400 mg/m2, Cycle 2, Reason: Provider Judgment, Comment: 20% dose reduction) Administration: 900 mg (10/04/2018), 700 mg (10/25/2018), 700 mg (12/01/2018), 700 mg (12/15/2018), 700 mg (12/29/2018), 700 mg (01/12/2019), 700 mg (01/26/2019), 700 mg (02/09/2019), 700 mg (05/11/2019), 700 mg (05/25/2019), 700 mg (06/08/2019), 700 mg (06/22/2019) fluorouracil (ADRUCIL) chemo injection 850 mg, 400 mg/m2 = 850 mg, Intravenous,  Once, 12 of 14 cycles Dose modification: 320 mg/m2 (original dose 400 mg/m2, Cycle 2, Reason: Provider Judgment, Comment: 20%  dose reduction) Administration: 850 mg (10/04/2018), 700 mg (10/25/2018), 700 mg (12/01/2018), 700 mg (12/15/2018), 700 mg (12/29/2018), 700 mg (01/12/2019), 700 mg (01/26/2019), 700 mg (02/09/2019), 700 mg (05/11/2019), 700 mg (05/25/2019), 700 mg (06/08/2019), 700 mg  (06/22/2019) fosaprepitant (EMEND) 150 mg, dexamethasone (DECADRON) 12 mg in sodium chloride 0.9 % 145 mL IVPB, , Intravenous,  Once, 10 of 12 cycles Administration:  (12/01/2018),  (12/15/2018),  (12/29/2018),  (01/12/2019),  (01/26/2019),  (02/09/2019),  (05/11/2019),  (05/25/2019),  (06/08/2019),  (06/22/2019) fluorouracil (ADRUCIL) 5,000 mg in sodium chloride 0.9 % 150 mL chemo infusion, 2,325 mg/m2 = 5,150 mg, Intravenous, 1 Day/Dose, 12 of 14 cycles Dose modification: 1,920 mg/m2 (original dose 2,400 mg/m2, Cycle 2, Reason: Provider Judgment, Comment: 20% dose reduction) Administration: 5,000 mg (10/04/2018), 4,100 mg (10/25/2018), 4,100 mg (12/01/2018), 4,100 mg (12/15/2018), 4,100 mg (12/29/2018), 4,100 mg (01/12/2019), 4,100 mg (01/26/2019), 4,100 mg (02/09/2019), 4,100 mg (05/11/2019), 4,100 mg (05/25/2019), 4,100 mg (06/08/2019), 4,100 mg (06/22/2019) bevacizumab-bvzr (ZIRABEV) 400 mg in sodium chloride 0.9 % 100 mL chemo infusion, 5 mg/kg = 400 mg (100 % of original dose 5 mg/kg), Intravenous,  Once, 4 of 6 cycles Dose modification: 5 mg/kg (original dose 5 mg/kg, Cycle 6) Administration: 400 mg (01/12/2019), 400 mg (01/26/2019), 400 mg (02/09/2019)  for chemotherapy treatment.    08/01/2019 -  Chemotherapy   The patient had palonosetron (ALOXI) injection 0.25 mg, 0.25 mg, Intravenous,  Once, 3 of 4 cycles Administration: 0.25 mg (08/01/2019), 0.25 mg (09/13/2019), 0.25 mg (08/22/2019) pegfilgrastim-cbqv (UDENYCA) injection 6 mg, 6 mg, Subcutaneous, Once, 3 of 4 cycles Administration: 6 mg (08/03/2019), 6 mg (08/24/2019) leucovorin 628 mg in dextrose 5 % 250 mL infusion, 320 mg/m2 = 628 mg (80 % of original dose 400 mg/m2), Intravenous,  Once, 3 of 4 cycles Dose modification: 320 mg/m2 (80 % of original dose 400 mg/m2, Cycle 1, Reason: Change in LFTs), 280 mg/m2 (70 % of original dose 400 mg/m2, Cycle 3, Reason: Provider Judgment), 356.8616 mg/m2 (66.7 % of original dose 400 mg/m2, Cycle 2, Reason: Provider  Judgment) Administration: 628 mg (08/01/2019), 522 mg (09/13/2019), 522 mg (08/22/2019) oxaliplatin (ELOXATIN) 135 mg in dextrose 5 % 500 mL chemo infusion, 68 mg/m2 = 135 mg (80 % of original dose 85 mg/m2), Intravenous,  Once, 3 of 4 cycles Dose modification: 68 mg/m2 (80 % of original dose 85 mg/m2, Cycle 1, Reason: Change in LFTs), 59.5 mg/m2 (70 % of original dose 85 mg/m2, Cycle 3, Reason: Provider Judgment), 83.7290 mg/m2 (66.7 % of original dose 85 mg/m2, Cycle 2, Reason: Provider Judgment) Administration: 135 mg (08/01/2019), 110 mg (09/13/2019), 110 mg (08/22/2019) fluorouracil (ADRUCIL) chemo injection 650 mg, 320 mg/m2 = 650 mg (80 % of original dose 400 mg/m2), Intravenous,  Once, 3 of 4 cycles Dose modification: 320 mg/m2 (80 % of original dose 400 mg/m2, Cycle 1, Reason: Change in LFTs), 280 mg/m2 (70 % of original dose 400 mg/m2, Cycle 3, Reason: Provider Judgment), 211.1552 mg/m2 (66.7 % of original dose 400 mg/m2, Cycle 2, Reason: Provider Judgment) Administration: 650 mg (08/01/2019), 500 mg (08/22/2019) fluorouracil (ADRUCIL) 3,750 mg in sodium chloride 0.9 % 75 mL chemo infusion, 1,920 mg/m2 = 3,750 mg (80 % of original dose 2,400 mg/m2), Intravenous, 1 Day/Dose, 3 of 4 cycles Dose modification: 1,920 mg/m2 (80 % of original dose 2,400 mg/m2, Cycle 1, Reason: Change in LFTs), 1,680 mg/m2 (70 % of original dose 2,400 mg/m2, Cycle 3, Reason: Provider Judgment), 1,600 mg/m2 (66.7 % of original dose 2,400 mg/m2, Cycle 2, Reason: Provider  Judgment) Administration: 3,750 mg (08/01/2019), 3,150 mg (08/22/2019) bevacizumab-bvzr (ZIRABEV) 400 mg in sodium chloride 0.9 % 100 mL chemo infusion, 5 mg/kg = 400 mg, Intravenous,  Once, 1 of 2 cycles  for chemotherapy treatment.      CANCER STAGING: Cancer Staging Rectal cancer [facility]) Staging form: Colon and Rectum, AJCC 7th Edition - Clinical: Stage IIIB (T3, N1, M0) - Signed by [name], MD on 02/04/2011 - Pathologic stage from 11/17/2014:  Stage IVA (M1a) - Signed by Baird Cancer, PA-C on 09/14/2015   INTERVAL HISTORY:  Mr. CERVANDO DURNIN, a 69 y.o. male, returns for routine follow-up of his stage IV colon cancer. [place] was last seen on 09/27/2019.   He started having constant nausea and vomiting on Saturday after starting his regorafenib on Friday, which is unrelieved by prochlorperazine. He was feeling slightly better prior to starting the pill. He cannot tolerate solid food, but is able to drink Ensure, which does not cause N/V. He denies diarrhea, nor any rashes. He still has to take pain medication every 3 hours for his back and R lateral chest pain. He discovered a tick on his R abdominal side and was removed in the office; it was seen yesterday.  He is reluctant about continuing taking regorafenib.    REVIEW OF SYSTEMS:  Review of Systems  Constitutional: Positive for appetite change (no appetite) and fatigue (severe).  Respiratory: Positive for shortness of breath (w/ exertion).   Cardiovascular: Positive for chest pain (8/10 R lateral chest pain).  Gastrointestinal: Positive for nausea and vomiting. Negative for diarrhea.  Musculoskeletal: Positive for back pain (8/10).  Skin: Negative for rash.  Neurological: Positive for dizziness and headaches.  Psychiatric/Behavioral: Positive for sleep disturbance.    PAST MEDICAL/SURGICAL HISTORY:  Past Medical History:  Diagnosis Date  . Chronic anticoagulation   . Colon cancer ([place]) 07/24/2010   rectal ca, inv adenocarcinoma  . Depression 04/01/2011  . Diabetes mellitus without complication ([place])   . DVT (deep venous thrombosis) ([place]) 05/09/2011  . GERD (gastroesophageal reflux disease)   . High output ileostomy ([place]) 05/21/2011  . History of kidney stones   . HTN (hypertension)   . Hx of radiation therapy 09/02/10 to 10/14/10   pelvis  . Lung metastasis ([place])   . Neuropathy   . Peripheral vascular disease ([place])    dvt's,pe  . Pneumonia    hx x3  .  Pulmonary embolism ([place])   . Rectal cancer ([place]) 01/15/2011   S/P radiation and concurrent 5-FU continuous infusion from 09/09/10- 10/10/10.  S/P proctectomy with colorectal anastomosis and diverting loop ileostomy on 11/14/10 at [facility] by Dr. Harlon Ditty. Pathology reveals a pT3b N1 with 3/20 lymph nodes.     Past Surgical History:  Procedure Laterality Date  . BILIARY STENT PLACEMENT N/A 09/16/2018   Procedure: STENT PLACEMENT;  Surgeon: Rogene Houston, MD;  [facility];  Service: Endoscopy;  Laterality: N/A;  . BILIARY STENT PLACEMENT N/A 07/18/2019   Procedure: BILIARY STENT PLACEMENT;  Surgeon: Rogene Houston, MD;  [facility];  Service: Endoscopy;  Laterality: N/A;  . BIOPSY  09/24/2018   Procedure: BIOPSY;  Surgeon: Danie Binder, MD;  [facility];  Service: Endoscopy;;  . COLON SURGERY  11/14/2010   proctectomy with colorectal anastomosis and diverting loop ileostomy (temporary planned)  . COLONOSCOPY  07/2010   proximal rectal apple core mass 10-14cm from anal verge (adenocarcinoma), 2-3cm distal rectal carpet polyp s/p piecemeal  snare polypectomy (adenoma)  . COLONOSCOPY  04/21/2012   RMR: Friable,fibrotic appearing colorectal anastomosis producing some luminal narrowing-not felt to be critical. path: focal erosion with slight inflammation and hyperemia. SURVEILLANCE DUE DEC 2015  . COLONOSCOPY N/A 11/24/2013   Dr. Rourk:somewhat fibrotic/friable anastomotic mucosal-status post biopsy (narrowing not felt to be clinically significant) Single colonic diverticulum. benign polypoid rectal mucosa  . colostomy reversal  april 2013  . ERCP N/A 09/16/2018   Procedure: ENDOSCOPIC RETROGRADE CHOLANGIOPANCREATOGRAPHY (ERCP);  Surgeon: Rogene Houston, MD;  [facility];  Service: Endoscopy;  Laterality: N/A;  . ERCP N/A 07/18/2019   Procedure: ENDOSCOPIC RETROGRADE CHOLANGIOPANCREATOGRAPHY (ERCP);  Surgeon: Rogene Houston, MD;  [facility];  Service: Endoscopy;  Laterality: N/A;  . ESOPHAGOGASTRODUODENOSCOPY  07/2010   RMR: schatki ring s/p dilation, small hh, SB bx benign  . ESOPHAGOGASTRODUODENOSCOPY N/A 09/04/2015   Procedure: ESOPHAGOGASTRODUODENOSCOPY (EGD);  Surgeon: Daneil Dolin, MD;  [facility];  Service: Endoscopy;  Laterality: N/A;  730  . EUS  08/2010   Dr. Owens Loffler. uT3N0 circumferential, nearly obstruction rectosigmoid adenocarcinoma, distal edge 12cm from anal verge  . EUS N/A 06/19/2016   Procedure: UPPER ENDOSCOPIC ULTRASOUND (EUS) RADIAL;  Surgeon: Milus Banister, MD;  [facility]: [facility];  Service: Endoscopy;  Laterality: N/A;  . FLEXIBLE SIGMOIDOSCOPY N/A 09/24/2018   Procedure: FLEXIBLE SIGMOIDOSCOPY;  Surgeon: Danie Binder, MD;  [facility];  Service: Endoscopy;  Laterality: N/A;  . HERNIA REPAIR     abd hernia repair  . IVC filter    . ivc filter    . port a cath placement    . PORT-A-CATH REMOVAL  09/24/2011   Procedure: REMOVAL PORT-A-CATH;  Surgeon: Donato Heinz, MD;  Location: [facility];  Service: General;  Laterality: N/A;  Minor Room  . PORTACATH PLACEMENT Right 07/07/2016   Procedure: INSERTION PORT-A-CATH;  Surgeon: Aviva Signs, MD;  Location: [facility];  Service: General;  Laterality: Right;  . SPHINCTEROTOMY N/A 09/16/2018   Procedure: SPHINCTEROTOMY with balloon dialation;  Surgeon: Rogene Houston, MD;  [facility];  Service: Endoscopy;  Laterality: N/A;  . TRANSVERSE LOOP COLOSTOMY N/A 09/29/2018   Procedure: TRANSVERSE LOOP COLOSTOMY;  Surgeon: Aviva Signs, MD;  Location: [facility];  Service: General;  Laterality: N/A;  . VIDEO ASSISTED THORACOSCOPY (VATS)/WEDGE RESECTION Right 11/15/2014   Procedure: VIDEO ASSISTED THORACOSCOPY (VATS)/LUNG RESECTION WITH RIGHT LINGULECTOMY;  Surgeon: [name], MD;  Location: [place];  Service: Thoracic;  Laterality: Right;  Marland Kitchen VIDEO BRONCHOSCOPY N/A 11/15/2014   Procedure: VIDEO BRONCHOSCOPY;  Surgeon:  [name], MD;  Location[facility] OR;  Service: Thoracic;  Laterality: N/A;    SOCIAL HISTORY:  Social History   Socioeconomic History  . Marital status: Married    Spouse name: Not on file  . Number of children: 2  . Years of education: Not on file  . Highest education level: Not on file  Occupational History  . Occupation: self-employed Regulatory affairs officer, currently not working    Fish farm manager: SELF EMPLOYED  Tobacco Use  . Smoking status: Never Smoker  . Smokeless tobacco: Never Used  Substance and Sexual Activity  . Alcohol use: No  . Drug use: No  . Sexual activity: Yes    Birth control/protection: None    Comment: married  Other Topics Concern  . Not on file  Social History Narrative  . Not on file   Social Determinants of Health   Financial  Resource Strain: Low Risk   . Difficulty of Paying Living Expenses: Not very hard  Food Insecurity: No Food Insecurity  . Worried About Charity fundraiser in the Last Year: Never true  . Ran Out of Food in the Last Year: Never true  Transportation Needs: No Transportation Needs  . Lack of Transportation (Medical): No  . Lack of Transportation (Non-Medical): No  Physical Activity: Inactive  . Days of Exercise per Week: 0 days  . Minutes of Exercise per Session: 0 min  Stress: No Stress Concern Present  . Feeling of Stress : Only a little  Social Connections: Unknown  . Frequency of Communication with Friends and Family: More than three times a week  . Frequency of Social Gatherings with Friends and Family: Once a week  . Attends Religious Services: Patient refused  . Active Member of Clubs or Organizations: Patient refused  . Attends Archivist Meetings: Patient refused  . Marital Status: Patient refused  Intimate Partner Violence: Unknown  . Fear of Current or Ex-Partner: Patient refused  . Emotionally Abused: Patient refused  . Physically Abused: Patient refused  . Sexually Abused: Patient refused     FAMILY HISTORY:  Family History  Problem Relation Age of Onset  . Cancer Brother        throat  . Cancer Brother        prostate  . Colon cancer Neg Hx   . Liver disease Neg Hx   . Inflammatory bowel disease Neg Hx     CURRENT MEDICATIONS:  Current Outpatient Medications  Medication Sig Dispense Refill  . acetaminophen (TYLENOL) 500 MG tablet Take 1,000 mg by mouth every 6 (six) hours as needed for mild pain or moderate pain.     . clonazePAM (KLONOPIN) 1 MG tablet Take 1 tablet (1 mg total) by mouth 3 (three) times daily as needed. 90 tablet 0  . gabapentin (NEURONTIN) 300 MG capsule Take 3 capsules (900 mg total) by mouth 2 (two) times daily. 540 capsule 0  . glimepiride (AMARYL) 2 MG tablet Take 2 mg by mouth 2 (two) times daily before a meal.     . magic mouthwash w/lidocaine SOLN Take 15 mLs by mouth in the morning and at bedtime. 480 mL 2  . magnesium oxide (MAG-OX) 400 (241.3 Mg) MG tablet Take 1 tablet (400 mg total) by mouth 3 (three) times daily. 180 tablet 3  . megestrol (MEGACE) 400 MG/10ML suspension Take 10 mLs (400 mg total) by mouth 2 (two) times daily. 480 mL 2  . mirtazapine (REMERON) 15 MG tablet Take 1 tablet (15 mg total) by mouth at bedtime. 30 tablet 3  . oxyCODONE (ROXICODONE) 15 MG immediate release tablet Take 1 tablet (15 mg total) by mouth every 4 (four) hours as needed. for pain 60 tablet 0  . pantoprazole (PROTONIX) 40 MG tablet Take 40 mg by mouth every evening.     . Probiotic Product (PROBIOTIC DAILY PO) Take 1 capsule by mouth 2 (two) times daily.     . prochlorperazine (COMPAZINE) 10 MG tablet Take 1 tablet (10 mg total) by mouth every 6 (six) hours as needed for nausea or vomiting. 30 tablet 3  . regorafenib (STIVARGA) 40 MG tablet Take 80 mg by mouth daily with breakfast. Take for 21 days, then hold for 7 days. Repeat every 28 days. Take with low fat meal.    . rivaroxaban (XARELTO) 20 MG TABS tablet Take 1 tablet (20 mg total) by mouth  daily  with supper. 30 tablet 3  . scopolamine (TRANSDERM-SCOP) 1 MG/3DAYS Place 1 patch (1.5 mg total) onto the skin every 3 (three) days. 10 patch 12  . vitamin B-12 (CYANOCOBALAMIN) 1000 MCG tablet Take 1,000 mcg by mouth daily.     No current facility-administered medications for this visit.   Facility-Administered Medications Ordered in Other Visits  Medication Dose Route Frequency Provider Last Rate Last Admin  . heparin lock flush 100 unit/mL  500 Units Intracatheter Once PRN Derek Jack, MD      . scopolamine (TRANSDERM-SCOP) 1 MG/3DAYS 1.5 mg  1 patch Transdermal Q72H Derek Jack, MD      . sodium chloride 0.9 % 1,000 mL with potassium chloride 20 mEq, magnesium sulfate 2 g infusion   Intravenous Once Derek Jack, MD      . sodium chloride 0.9 % injection 10 mL  10 mL Intravenous PRN Everardo All, MD   10 mL at 08/22/11 1114  . sodium chloride flush (NS) 0.9 % injection 10 mL  10 mL Intracatheter PRN Ardath Sax, MD   10 mL at 05/08/17 1127  . sodium chloride flush (NS) 0.9 % injection 10 mL  10 mL Intracatheter Once PRN Derek Jack, MD        ALLERGIES:  Allergies  Allergen Reactions  . Oxaliplatin Other (See Comments)    Chest pain  . Oxycodone     Blisters, hallucinations (takes Percocet at home)  . Tramadol     Blisters, hallucinations  . Trazodone And Nefazodone Other (See Comments)    hallucinations    PHYSICAL EXAM:  Performance status (ECOG): 1 - Symptomatic but completely ambulatory  There were no vitals filed for this visit. Wt Readings from Last 3 Encounters:  10/10/19 146 lb 14.4 oz (66.6 kg)  09/27/19 149 lb 11.2 oz (67.9 kg)  09/21/19 149 lb 9.6 oz (67.9 kg)   Physical Exam Vitals reviewed.  Constitutional:      Appearance: Normal appearance.  Cardiovascular:     Rate and Rhythm: Normal rate and regular rhythm.     Pulses: Normal pulses.     Heart sounds: Normal heart sounds.  Pulmonary:     Effort: Pulmonary  effort is normal.     Breath sounds: Normal breath sounds.  Musculoskeletal:     Right lower leg: No edema.     Left lower leg: No edema.  Neurological:     General: No focal deficit present.     Mental Status: He is alert and oriented to person, place, and time.  Psychiatric:        Mood and Affect: Mood normal.        Behavior: Behavior normal.     LABORATORY DATA:  I have reviewed the labs as listed.  CBC Latest Ref Rng & Units 09/22/2019 09/13/2019 09/05/2019  WBC 4.0 - 10.5 K/uL 7.2 14.0(H) 13.3(H)  Hemoglobin 13.0 - 17.0 g/dL 11.1(L) 11.7(L) 12.0(L)  Hematocrit 39.0 - 52.0 % 36.3(L) 37.8(L) 37.7(L)  Platelets 150 - 400 K/uL 279 272 270   CMP Latest Ref Rng & Units 09/22/2019 09/13/2019 09/05/2019  Glucose 70 - 99 mg/dL 98 204(H) 188(H)  BUN 8 - 23 mg/dL '11 10 9  '$ Creatinine 0.61 - 1.24 mg/dL 0.64 0.51(L) 0.59(L)  Sodium 135 - 145 mmol/L 133(L) 135 134(L)  Potassium 3.5 - 5.1 mmol/L 3.9 3.7 3.5  Chloride 98 - 111 mmol/L 99 103 100  CO2 22 - 32 mmol/L '26 24 24  '$ Calcium 8.9 -  10.3 mg/dL 8.5(L) 8.7(L) 8.6(L)  Total Protein 6.5 - 8.1 g/dL 6.3(L) 6.3(L) 6.2(L)  Total Bilirubin 0.3 - 1.2 mg/dL 0.9 1.0 1.1  Alkaline Phos 38 - 126 U/L 175(H) 218(H) 232(H)  AST 15 - 41 U/L '22 20 19  '$ ALT 0 - 44 U/L '17 16 16    '$ DIAGNOSTIC IMAGING:  I have independently reviewed the scans and discussed with the patient. CT Chest W Contrast  Result Date: 09/23/2019 CLINICAL DATA:  Rectal cancer, status post proctectomy with colorectal anastomosis, status post ileostomy, and status post radiation therapy. Lung metastases. EXAM: CT CHEST, ABDOMEN, AND PELVIS WITH CONTRAST TECHNIQUE: Multidetector CT imaging of the chest, abdomen and pelvis was performed following the standard protocol during bolus administration of intravenous contrast. CONTRAST:  124m OMNIPAQUE IOHEXOL 300 MG/ML  SOLN COMPARISON:  07/16/2019 FINDINGS: CT CHEST FINDINGS Cardiovascular: Heart is normal in size.  No pericardial effusion. No  evidence of thoracic aortic aneurysm. Mild atherosclerotic calcifications of the aortic arch. Mild three-vessel coronary atherosclerosis. Right chest port terminates in the lower SVC. Mediastinum/Nodes: Small mediastinal nodes which do not meet pathologic CT size criteria. Visualized thyroid is unremarkable. Lungs/Pleura: Progressive right lung metastases, including: --14 mm subpleural nodule in the posterior right upper lobe (series 3/image 20), previously 13 mm --14 mm subpleural nodule in the posterior right upper lobe along the major fissure (series 3/image 74), previously 13 mm when measured in a similar fashion --16 mm subpleural nodule in the posterior right lower lobe (series 3/image 124), obscured by pleural fluid on the prior but likely measuring 12 mm in retrospect --additional subcentimeter nodules in the right upper, middle, and lower lobes which are progressive Mild biapical pleural-parenchymal scarring. Trace right pleural effusion, stable versus mildly improved. No pneumothorax. Musculoskeletal: Degenerative changes of the thoracic spine. Right posteromedial 11th rib metastasis (series 2/image 85), similar. Right lower sternal metastasis (series 2/image 46), similar. CT ABDOMEN PELVIS FINDINGS Hepatobiliary: Progressive hepatic metastases. Two dominant lesions along the posterior aspect of segment 7 measure 2.3 and 2.7 cm (series 2/image 55-56), previously measuring less than 10 mm. 1.6 cm lesion along the gallbladder fossa in segment 4B (series 2/image 84), new. Dominant 3.2 cm lesion in the inferior aspect of segment 6 (series 2/image 71), previously 1.9 cm. Gallbladder is unremarkable. No intrahepatic or extrahepatic ductal dilatation. Pneumobilia.  Indwelling common duct stent in satisfactory position. Pancreas: Progressive hypoenhancing mass in the pancreatic head/uncinate process, measuring 3.4 x 3.0 cm (series 2/image 71), previously 2.4 x 2.8 cm when measured in a similar fashion. Associated  atrophy of the pancreatic body/tail with ductal dilatation. Spleen: Within normal limits. Adrenals/Urinary Tract: Chronic left adrenal calcification. Right adrenal gland is within normal limits. Kidneys are within normal limits.  No hydronephrosis. Thick-walled bladder, although underdistended. Stomach/Bowel: Stomach is within normal limits. No evidence of bowel obstruction. Normal appendix (series 2/image 91). Status post left hemicolectomy with low rectal anastomosis (series 2/image 119). Left mid abdominal transverse colostomy with mucous fistula (series 2/image 90). Vascular/Lymphatic: No evidence of abdominal aortic aneurysm. Atherosclerotic calcifications of the abdominal aorta and branch vessels. IVC filter centered at the L2 level. Dominant 16 mm short axis peripancreatic node (series 2/image 70), previously 10 mm. Reproductive: Prostate is notable for dystrophic calcifications. Other: No abdominopelvic ascites. Musculoskeletal: Mild degenerative changes of the lumbar spine. IMPRESSION: Postsurgical changes involving the left colon. Progressive right pulmonary metastases, with dominant lesions as above. Trace right pleural effusion, stable versus mildly improved. Progressive hepatic metastases, with dominant lesions above. Progressive pancreatic metastasis and  peripancreatic nodal metastasis. Osseous metastases involving the right posterior limits rib and right inferior sternum, grossly unchanged. Additional ancillary findings as above. Electronically Signed   By: Julian Hy M.D.   On: 09/23/2019 09:45   CT Abdomen Pelvis W Contrast  Result Date: 09/23/2019 CLINICAL DATA:  Rectal cancer, status post proctectomy with colorectal anastomosis, status post ileostomy, and status post radiation therapy. Lung metastases. EXAM: CT CHEST, ABDOMEN, AND PELVIS WITH CONTRAST TECHNIQUE: Multidetector CT imaging of the chest, abdomen and pelvis was performed following the standard protocol during bolus  administration of intravenous contrast. CONTRAST:  113m OMNIPAQUE IOHEXOL 300 MG/ML  SOLN COMPARISON:  07/16/2019 FINDINGS: CT CHEST FINDINGS Cardiovascular: Heart is normal in size.  No pericardial effusion. No evidence of thoracic aortic aneurysm. Mild atherosclerotic calcifications of the aortic arch. Mild three-vessel coronary atherosclerosis. Right chest port terminates in the lower SVC. Mediastinum/Nodes: Small mediastinal nodes which do not meet pathologic CT size criteria. Visualized thyroid is unremarkable. Lungs/Pleura: Progressive right lung metastases, including: --14 mm subpleural nodule in the posterior right upper lobe (series 3/image 20), previously 13 mm --14 mm subpleural nodule in the posterior right upper lobe along the major fissure (series 3/image 74), previously 13 mm when measured in a similar fashion --16 mm subpleural nodule in the posterior right lower lobe (series 3/image 124), obscured by pleural fluid on the prior but likely measuring 12 mm in retrospect --additional subcentimeter nodules in the right upper, middle, and lower lobes which are progressive Mild biapical pleural-parenchymal scarring. Trace right pleural effusion, stable versus mildly improved. No pneumothorax. Musculoskeletal: Degenerative changes of the thoracic spine. Right posteromedial 11th rib metastasis (series 2/image 85), similar. Right lower sternal metastasis (series 2/image 46), similar. CT ABDOMEN PELVIS FINDINGS Hepatobiliary: Progressive hepatic metastases. Two dominant lesions along the posterior aspect of segment 7 measure 2.3 and 2.7 cm (series 2/image 55-56), previously measuring less than 10 mm. 1.6 cm lesion along the gallbladder fossa in segment 4B (series 2/image 84), new. Dominant 3.2 cm lesion in the inferior aspect of segment 6 (series 2/image 71), previously 1.9 cm. Gallbladder is unremarkable. No intrahepatic or extrahepatic ductal dilatation. Pneumobilia.  Indwelling common duct stent in  satisfactory position. Pancreas: Progressive hypoenhancing mass in the pancreatic head/uncinate process, measuring 3.4 x 3.0 cm (series 2/image 71), previously 2.4 x 2.8 cm when measured in a similar fashion. Associated atrophy of the pancreatic body/tail with ductal dilatation. Spleen: Within normal limits. Adrenals/Urinary Tract: Chronic left adrenal calcification. Right adrenal gland is within normal limits. Kidneys are within normal limits.  No hydronephrosis. Thick-walled bladder, although underdistended. Stomach/Bowel: Stomach is within normal limits. No evidence of bowel obstruction. Normal appendix (series 2/image 91). Status post left hemicolectomy with low rectal anastomosis (series 2/image 119). Left mid abdominal transverse colostomy with mucous fistula (series 2/image 90). Vascular/Lymphatic: No evidence of abdominal aortic aneurysm. Atherosclerotic calcifications of the abdominal aorta and branch vessels. IVC filter centered at the L2 level. Dominant 16 mm short axis peripancreatic node (series 2/image 70), previously 10 mm. Reproductive: Prostate is notable for dystrophic calcifications. Other: No abdominopelvic ascites. Musculoskeletal: Mild degenerative changes of the lumbar spine. IMPRESSION: Postsurgical changes involving the left colon. Progressive right pulmonary metastases, with dominant lesions as above. Trace right pleural effusion, stable versus mildly improved. Progressive hepatic metastases, with dominant lesions above. Progressive pancreatic metastasis and peripancreatic nodal metastasis. Osseous metastases involving the right posterior limits rib and right inferior sternum, grossly unchanged. Additional ancillary findings as above. Electronically Signed   By: SHenderson NewcomerD.  On: 09/23/2019 09:45     ASSESSMENT:  1.  Stage IV rectal cancer with lung and pancreatic metastasis: -Last chemotherapy with FOLFOX on 08/12/2019. -CT on 09/22/2019 showed progression of right pulmonary  meta stasis, trace right pleural effusion, progressive liver mets, progressive pancreatic meta stasis.  Bone mets involving right posterior rib and right inferior sternum grossly unchanged. -Stivarga 80 mg started on 10/07/2019.    PLAN:  1.  Stage IV rectal cancer with lung and pancreatic metastasis: -He developed severe nausea and vomiting when he started taking Stivarga 80 mg on 10/07/2019. -We will discontinue Stivarga.  We talked about palliative care.  We have made a referral.  He will receive IV fluids with nausea medicine today. -He will come back in 3 weeks for follow-up.  2.  Weight loss: -He lost about 3 pounds.  This is because of nausea and vomiting.  He is drinking boost +4 to 5 cans/day.  He does not report any nausea vomiting with boost.  3.  Right lateral chest wall pain: -He is taking oxycodone 15 mg every 3 hours. -I will start him on OxyContin 30 mg every 12 hours and oxycodone 15 mg as needed for breakthrough pain.  4.  Neuropathy: -Continue gabapentin 900 mg twice daily.  5.  Anxiety: -Continue Klonopin 1 mg twice daily as needed.  6.  Hypomagnesemia: -Continue magnesium twice daily.  7.  DVT and PE: -Continue Xarelto.  No bleeding issues.  8.  Sleeping difficulty: -Continue Remeron 15 mg daily.  9.  Elevated bilirubin: -Total bilirubin is 2.6 today.  Direct bilirubin is 0.4.  He has a biliary stent.    Orders placed this encounter:  No orders of the defined types were placed in this encounter.    Derek Jack, MD [place] [phone]     I, Milinda Antis, am acting as a scribe for Dr. Sanda Linger.  I, Derek Jack MD, have reviewed the above documentation for accuracy and completeness, and I agree with the above.

## 2019-10-10 NOTE — Progress Notes (Signed)
Patient started [place] on October 07, 2019.  Patient stated he is having nausea and vomiting with the medication.  He also stated his nausea medication is not helping.  Denied diarrhea with colostomy.  Also complained of increased pain on the right side and back.  Weakness and fatigue.  Patient is considering not taking Stivarga due to weekend side effects and overall weakness and fatigue. Family at side.    Reviewed patients complaints with Dr. [name] with oncology follow up visit to be scheduled for today.  Scopolamine patch verbal order Dr. [name] and hydration.  Labs drawn from port.    Patient tolerated hydration with no complaints voiced.  Port site clean and dry with good blood return noted before and after hydration.  Patient stated he felt better and nausea had improved.  No bruising or swelling noted with port.  Band aid applied.  VSS with discharge and left by wheelchair with no s/s of distress noted.

## 2019-10-10 NOTE — Progress Notes (Signed)
Since starting Stivarga, patient is reporting excessive nausea/vomiting. He is unable to keep anything down with or without antiemetics.  His wife called today and said he was weak from vomiting so much and wants to bring him in.    Dr. [name] aware and orders received for labs today, hydration and scopolamine patch.    I have updated the primary RN that will care for him today and patient is aware of appt.

## 2019-10-10 NOTE — Patient Instructions (Addendum)
[place] at [facility] Discharge Instructions  You were seen today by Dr. [name]. He went over your recent results. Please stop taking the cancer medication. You will be prescribed medications for your nausea. You will also receive a long-lasting pain medication which can be taken every 12 hours, and take the immediate acting pain medication as needed. A palliative care team will be referred to you. Please return in 1 week for labs. Dr. [name] will see you back in 4 weeks for labs and follow up.   Thank you for choosing [place] at [facility] to provide your oncology and hematology care.  To afford each patient quality time with our provider, please arrive at least 15 minutes before your scheduled appointment time.   If you have a lab appointment with the [facility] please come in thru the  Main Entrance and check in at the main information desk  You need to re-schedule your appointment should you arrive 10 or more minutes late.  We strive to give you quality time with our providers, and arriving late affects you and other patients whose appointments are after yours.  Also, if you no show three or more times for appointments you may be dismissed from the clinic at the providers discretion.     Again, thank you for choosing [facility].  Our hope is that these requests will decrease the amount of time that you wait before being seen by our physicians.       _____________________________________________________________  Should you have questions after your visit to [facility], please contact our office at [phone] between the hours of 8:00 a.m. and 4:30 p.m.  Voicemails left after 4:00 p.m. will not be returned until the following business day.  For prescription refill requests, have your pharmacy contact our office and allow 72 hours.    Cancer Center Support Programs:   > Cancer Support Group  2nd  Tuesday of the month 1pm-2pm, Journey Room

## 2019-10-11 ENCOUNTER — Emergency Department (HOSPITAL_COMMUNITY): Payer: Medicare Other

## 2019-10-11 ENCOUNTER — Inpatient Hospital Stay (HOSPITAL_COMMUNITY)
Admission: EM | Admit: 2019-10-11 | Discharge: 2019-10-14 | DRG: 864 | Disposition: A | Discharge status: Hospice | Payer: Medicare Other | Attending: Internal Medicine | Admitting: Internal Medicine

## 2019-10-11 ENCOUNTER — Encounter (HOSPITAL_COMMUNITY): Payer: Self-pay | Admitting: Family Medicine

## 2019-10-11 ENCOUNTER — Other Ambulatory Visit: Payer: Self-pay

## 2019-10-11 DIAGNOSIS — Z8042 Family history of malignant neoplasm of prostate: Secondary | ICD-10-CM | POA: Diagnosis not present

## 2019-10-11 DIAGNOSIS — E11649 Type 2 diabetes mellitus with hypoglycemia without coma: Secondary | ICD-10-CM | POA: Diagnosis not present

## 2019-10-11 DIAGNOSIS — A419 Sepsis, unspecified organism: Secondary | ICD-10-CM

## 2019-10-11 DIAGNOSIS — K219 Gastro-esophageal reflux disease without esophagitis: Secondary | ICD-10-CM | POA: Diagnosis present

## 2019-10-11 DIAGNOSIS — E119 Type 2 diabetes mellitus without complications: Secondary | ICD-10-CM | POA: Diagnosis not present

## 2019-10-11 DIAGNOSIS — E1151 Type 2 diabetes mellitus with diabetic peripheral angiopathy without gangrene: Secondary | ICD-10-CM | POA: Diagnosis present

## 2019-10-11 DIAGNOSIS — C19 Malignant neoplasm of rectosigmoid junction: Secondary | ICD-10-CM | POA: Diagnosis present

## 2019-10-11 DIAGNOSIS — E871 Hypo-osmolality and hyponatremia: Secondary | ICD-10-CM

## 2019-10-11 DIAGNOSIS — Z933 Colostomy status: Secondary | ICD-10-CM

## 2019-10-11 DIAGNOSIS — Z923 Personal history of irradiation: Secondary | ICD-10-CM

## 2019-10-11 DIAGNOSIS — C772 Secondary and unspecified malignant neoplasm of intra-abdominal lymph nodes: Secondary | ICD-10-CM | POA: Diagnosis present

## 2019-10-11 DIAGNOSIS — Z86711 Personal history of pulmonary embolism: Secondary | ICD-10-CM

## 2019-10-11 DIAGNOSIS — R509 Fever, unspecified: Secondary | ICD-10-CM | POA: Diagnosis not present

## 2019-10-11 DIAGNOSIS — G9349 Other encephalopathy: Secondary | ICD-10-CM | POA: Diagnosis present

## 2019-10-11 DIAGNOSIS — Z7901 Long term (current) use of anticoagulants: Secondary | ICD-10-CM

## 2019-10-11 DIAGNOSIS — Z808 Family history of malignant neoplasm of other organs or systems: Secondary | ICD-10-CM | POA: Diagnosis not present

## 2019-10-11 DIAGNOSIS — I10 Essential (primary) hypertension: Secondary | ICD-10-CM | POA: Diagnosis present

## 2019-10-11 DIAGNOSIS — C7951 Secondary malignant neoplasm of bone: Secondary | ICD-10-CM | POA: Diagnosis present

## 2019-10-11 DIAGNOSIS — Z20822 Contact with and (suspected) exposure to covid-19: Secondary | ICD-10-CM | POA: Diagnosis present

## 2019-10-11 DIAGNOSIS — Z515 Encounter for palliative care: Secondary | ICD-10-CM | POA: Diagnosis not present

## 2019-10-11 DIAGNOSIS — C787 Secondary malignant neoplasm of liver and intrahepatic bile duct: Secondary | ICD-10-CM | POA: Diagnosis present

## 2019-10-11 DIAGNOSIS — Z7189 Other specified counseling: Secondary | ICD-10-CM | POA: Diagnosis not present

## 2019-10-11 DIAGNOSIS — G893 Neoplasm related pain (acute) (chronic): Secondary | ICD-10-CM | POA: Diagnosis present

## 2019-10-11 DIAGNOSIS — E861 Hypovolemia: Secondary | ICD-10-CM | POA: Diagnosis present

## 2019-10-11 DIAGNOSIS — Z66 Do not resuscitate: Secondary | ICD-10-CM | POA: Diagnosis not present

## 2019-10-11 DIAGNOSIS — D649 Anemia, unspecified: Secondary | ICD-10-CM | POA: Diagnosis present

## 2019-10-11 DIAGNOSIS — G9340 Encephalopathy, unspecified: Secondary | ICD-10-CM | POA: Diagnosis not present

## 2019-10-11 DIAGNOSIS — R6510 Systemic inflammatory response syndrome (SIRS) of non-infectious origin without acute organ dysfunction: Secondary | ICD-10-CM | POA: Diagnosis not present

## 2019-10-11 DIAGNOSIS — D689 Coagulation defect, unspecified: Secondary | ICD-10-CM | POA: Diagnosis present

## 2019-10-11 DIAGNOSIS — Z85048 Personal history of other malignant neoplasm of rectum, rectosigmoid junction, and anus: Secondary | ICD-10-CM

## 2019-10-11 DIAGNOSIS — R519 Headache, unspecified: Secondary | ICD-10-CM | POA: Diagnosis not present

## 2019-10-11 DIAGNOSIS — C7801 Secondary malignant neoplasm of right lung: Secondary | ICD-10-CM | POA: Diagnosis present

## 2019-10-11 DIAGNOSIS — R531 Weakness: Secondary | ICD-10-CM | POA: Diagnosis not present

## 2019-10-11 DIAGNOSIS — R6520 Severe sepsis without septic shock: Secondary | ICD-10-CM

## 2019-10-11 DIAGNOSIS — R000 Tachycardia, unspecified: Secondary | ICD-10-CM | POA: Diagnosis not present

## 2019-10-11 DIAGNOSIS — Z9049 Acquired absence of other specified parts of digestive tract: Secondary | ICD-10-CM

## 2019-10-11 LAB — COMPREHENSIVE METABOLIC PANEL
ALT: 27 U/L (ref 0–44)
AST: 32 U/L (ref 15–41)
Albumin: 2.7 g/dL — ABNORMAL LOW (ref 3.5–5.0)
Alkaline Phosphatase: 113 U/L (ref 38–126)
Anion gap: 11 (ref 5–15)
BUN: 14 mg/dL (ref 8–23)
CO2: 27 mmol/L (ref 22–32)
Calcium: 8.2 mg/dL — ABNORMAL LOW (ref 8.9–10.3)
Chloride: 94 mmol/L — ABNORMAL LOW (ref 98–111)
Creatinine, Ser: 0.84 mg/dL (ref 0.61–1.24)
GFR calc Af Amer: >60 mL/min (ref >60)
GFR calc non Af Amer: >60 mL/min (ref >60)
Glucose, Bld: 112 mg/dL — ABNORMAL HIGH (ref 70–99)
Potassium: 4.1 mmol/L (ref 3.5–5.1)
Sodium: 132 mmol/L — ABNORMAL LOW (ref 135–145)
Total Bilirubin: 1.4 mg/dL — ABNORMAL HIGH (ref 0.3–1.2)
Total Protein: 6.5 g/dL (ref 6.5–8.1)

## 2019-10-11 LAB — CBC WITH DIFFERENTIAL/PLATELET
Abs Immature Granulocytes: 0.03 10*3/uL (ref 0.00–0.07)
Basophils Absolute: 0 10*3/uL (ref 0.0–0.1)
Basophils Relative: 0 %
Eosinophils Absolute: 0 10*3/uL (ref 0.0–0.5)
Eosinophils Relative: 0 %
HCT: 33.6 % — ABNORMAL LOW (ref 39.0–52.0)
Hemoglobin: 10.8 g/dL — ABNORMAL LOW (ref 13.0–17.0)
Immature Granulocytes: 0 %
Lymphocytes Relative: 10 %
Lymphs Abs: 0.9 10*3/uL (ref 0.7–4.0)
MCH: 27.9 pg (ref 26.0–34.0)
MCHC: 32.1 g/dL (ref 30.0–36.0)
MCV: 86.8 fL (ref 80.0–100.0)
Monocytes Absolute: 0.7 10*3/uL (ref 0.1–1.0)
Monocytes Relative: 8 %
Neutro Abs: 7.4 10*3/uL (ref 1.7–7.7)
Neutrophils Relative %: 82 %
Platelets: 188 10*3/uL (ref 150–400)
RBC: 3.87 MIL/uL — ABNORMAL LOW (ref 4.22–5.81)
RDW: 14.6 % (ref 11.5–15.5)
WBC: 9.1 10*3/uL (ref 4.0–10.5)
nRBC: 0 % (ref 0.0–0.2)

## 2019-10-11 LAB — PROTIME-INR
INR: 6 (ref 0.8–1.2)
Prothrombin Time: 51.6 seconds — ABNORMAL HIGH (ref 11.4–15.2)

## 2019-10-11 LAB — SARS CORONAVIRUS 2 BY RT PCR (HOSPITAL ORDER, PERFORMED IN ~~LOC~~ HOSPITAL LAB): SARS Coronavirus 2: NEGATIVE

## 2019-10-11 LAB — LACTIC ACID, PLASMA
Lactic Acid, Venous: 2.5 mmol/L (ref 0.5–1.9)
Lactic Acid, Venous: 1.3 mmol/L (ref 0.5–1.9)

## 2019-10-11 LAB — APTT: aPTT: 116 seconds — ABNORMAL HIGH (ref 24–36)

## 2019-10-11 MED ORDER — SODIUM CHLORIDE 0.9 % IV SOLN
1.0000 g | Freq: Once | INTRAVENOUS | Status: AC
  Administered 2019-10-11: 1 g via INTRAVENOUS
  Filled 2019-10-11: qty 10

## 2019-10-11 MED ORDER — ACETAMINOPHEN 325 MG PO TABS
650.0000 mg | ORAL_TABLET | Freq: Once | ORAL | Status: AC
  Administered 2019-10-11: 650 mg via ORAL
  Filled 2019-10-11: qty 2

## 2019-10-11 MED ORDER — SODIUM CHLORIDE 0.9 % IV BOLUS
1000.0000 mL | Freq: Once | INTRAVENOUS | Status: AC
  Administered 2019-10-11: 1000 mL via INTRAVENOUS

## 2019-10-11 NOTE — H&P (Signed)
History and Physical    Charles [name] YQI:[id number] DOB: June 24, 1950 DOA: 10/11/2019  PCP: [name], MD   Patient coming from: Home   Chief Complaint: Confusion, fatigue, malaise   HPI: Charles [name] is a 69 y.o. male with medical history significant for colorectal cancer with metastases to pancreas, liver, and lungs, type 2 diabetes mellitus, history of PE now on Xarelto, and chronic pain related to his cancer, now presenting to the emergency department for evaluation of malaise, fatigue, and confusion.  Patient was recently started on [place] by his oncologist, developed persistent nausea and vomiting, was treated with IV fluids and scopolamine patch at the cancer center on 10/10/2019, but then this morning per report of his wife, he had increased fatigue and was confused.  Patient is normally fully oriented and able to take care of his own ADLs, but was disoriented this morning and requiring assistance with dressing and eating.  Patient complained of general malaise but nothing else.  By time of admission, the patient is oriented and able to provide a history, reporting that his chronic right-sided abdominal pain is unchanged, he does not have any cough or shortness of breath, and he has not noticed any change in his stool.  He denies headache, chest pain, or palpitations.  ED Course: Upon arrival to the ED, patient is found to be febrile to 39.6 C, saturating well on room air, tachycardic in the low 100s, and with blood pressure 92/71.  EKG features sinus tachycardia with rate 109.  Chest x-rays negative for acute cardiopulmonary disease.  Noncontrast head CT is negative for acute intracranial abnormality.  Chemistry panel notable for mild hyponatremia and albumin of 2.7.  CBC with normocytic anemia, hemoglobin 10.8.  A lactic acid is elevated to 2.5, and normal after a liter of fluids.  INR is elevated to 6.0.  Patient was given a liter of saline, blood cultures were collected, and he  was given a dose of Rocephin.  COVID-19 screening test was negative.  Urinalysis and urine culture are ordered.  Review of Systems:  All other systems reviewed and apart from HPI, are negative.  Past Medical History:  Diagnosis Date  . Chronic anticoagulation   . Colon cancer ([place]) 07/24/2010   rectal ca, inv adenocarcinoma  . Depression 04/01/2011  . Diabetes mellitus without complication ([place])   . DVT (deep venous thrombosis) ([place]) 05/09/2011  . GERD (gastroesophageal reflux disease)   . High output ileostomy ([place]) 05/21/2011  . History of kidney stones   . HTN (hypertension)   . Hx of radiation therapy 09/02/10 to 10/14/10   pelvis  . Lung metastasis ([place])   . Neuropathy   . Peripheral vascular disease ([place])    dvt's,pe  . Pneumonia    hx x3  . Pulmonary embolism ([place])   . Rectal cancer ([place]) 01/15/2011   S/P radiation and concurrent 5-FU continuous infusion from 09/09/10- 10/10/10.  S/P proctectomy with colorectal anastomosis and diverting loop ileostomy on 11/14/10 at [facility] by Dr. Harlon Ditty. Pathology reveals a pT3b N1 with 3/20 lymph nodes.      Past Surgical History:  Procedure Laterality Date  . BILIARY STENT PLACEMENT N/A 09/16/2018   Procedure: STENT PLACEMENT;  Surgeon: Rogene Houston, MD;  [facility];  Service: Endoscopy;  Laterality: N/A;  . BILIARY STENT PLACEMENT N/A 07/18/2019   Procedure: BILIARY STENT PLACEMENT;  Surgeon: Rogene Houston, MD;  [facility];  Service: Endoscopy;  Laterality: N/A;  .  BIOPSY  09/24/2018   Procedure: BIOPSY;  Surgeon: Danie Binder, MD;  [facility];  Service: Endoscopy;;  . COLON SURGERY  11/14/2010   proctectomy with colorectal anastomosis and diverting loop ileostomy (temporary planned)  . COLONOSCOPY  07/2010   proximal rectal apple core mass 10-14cm from anal verge (adenocarcinoma), 2-3cm distal rectal carpet polyp s/p piecemeal snare polypectomy (adenoma)  . COLONOSCOPY  04/21/2012   RMR:  Friable,fibrotic appearing colorectal anastomosis producing some luminal narrowing-not felt to be critical. path: focal erosion with slight inflammation and hyperemia. SURVEILLANCE DUE DEC 2015  . COLONOSCOPY N/A 11/24/2013   Dr. Rourk:somewhat fibrotic/friable anastomotic mucosal-status post biopsy (narrowing not felt to be clinically significant) Single colonic diverticulum. benign polypoid rectal mucosa  . colostomy reversal  april 2013  . ERCP N/A 09/16/2018   Procedure: ENDOSCOPIC RETROGRADE CHOLANGIOPANCREATOGRAPHY (ERCP);  Surgeon: Rogene Houston, MD;  [facility];  Service: Endoscopy;  Laterality: N/A;  . ERCP N/A 07/18/2019   Procedure: ENDOSCOPIC RETROGRADE CHOLANGIOPANCREATOGRAPHY (ERCP);  Surgeon: Rogene Houston, MD;  [facility];  Service: Endoscopy;  Laterality: N/A;  . ESOPHAGOGASTRODUODENOSCOPY  07/2010   RMR: schatki ring s/p dilation, small hh, SB bx benign  . ESOPHAGOGASTRODUODENOSCOPY N/A 09/04/2015   Procedure: ESOPHAGOGASTRODUODENOSCOPY (EGD);  Surgeon: Daneil Dolin, MD;  [facility];  Service: Endoscopy;  Laterality: N/A;  730  . EUS  08/2010   Dr. Owens Loffler. uT3N0 circumferential, nearly obstruction rectosigmoid adenocarcinoma, distal edge 12cm from anal verge  . EUS N/A 06/19/2016   Procedure: UPPER ENDOSCOPIC ULTRASOUND (EUS) RADIAL;  Surgeon: Milus Banister, MD;  [facility];  Service: Endoscopy;  Laterality: N/A;  . FLEXIBLE SIGMOIDOSCOPY N/A 09/24/2018   Procedure: FLEXIBLE SIGMOIDOSCOPY;  Surgeon: Danie Binder, MD;  [facility];  Service: Endoscopy;  Laterality: N/A;  . HERNIA REPAIR     abd hernia repair  . IVC filter    . ivc filter    . port a cath placement    . PORT-A-CATH REMOVAL  09/24/2011   Procedure: REMOVAL PORT-A-CATH;  Surgeon: Donato Heinz, MD;  Location: [facility];  Service: General;  Laterality: N/A;  Minor Room  . PORTACATH PLACEMENT Right 07/07/2016   Procedure: INSERTION  PORT-A-CATH;  Surgeon: Aviva Signs, MD;  Location[facility];  Service: General;  Laterality: Right;  . SPHINCTEROTOMY N/A 09/16/2018   Procedure: SPHINCTEROTOMY with balloon dialation;  Surgeon: Rogene Houston, MD;  [facility];  Service: Endoscopy;  Laterality: N/A;  . TRANSVERSE LOOP COLOSTOMY N/A 09/29/2018   Procedure: TRANSVERSE LOOP COLOSTOMY;  Surgeon: Aviva Signs, MD;  [facility];  Service: General;  Laterality: N/A;  . VIDEO ASSISTED THORACOSCOPY (VATS)/WEDGE RESECTION Right 11/15/2014   Procedure: VIDEO ASSISTED THORACOSCOPY (VATS)/LUNG RESECTION WITH RIGHT LINGULECTOMY;  Surgeon: [name], MD;  Location: [place];  Service: Thoracic;  Laterality: Right;  Marland Kitchen VIDEO BRONCHOSCOPY N/A 11/15/2014   Procedure: VIDEO BRONCHOSCOPY;  Surgeon: [name], MD;  Location: [place];  Service: Thoracic;  Laterality: N/A;     reports that he has never smoked. He has never used smokeless tobacco. He reports that he does not drink alcohol or use drugs.  Allergies  Allergen Reactions  . Oxaliplatin Other (See Comments)    Chest pain  . Oxycodone     Blisters, hallucinations (takes Percocet at home)  . Tramadol     Blisters, hallucinations  . Trazodone And Nefazodone Other (See Comments)  hallucinations    Family History  Problem Relation Age of Onset  . Cancer Brother        throat  . Cancer Brother        prostate  . Colon cancer Neg Hx   . Liver disease Neg Hx   . Inflammatory bowel disease Neg Hx      Prior to Admission medications   Medication Sig Start Date End Date Taking? Authorizing Provider  acetaminophen (TYLENOL) 500 MG tablet Take 1,000 mg by mouth every 6 (six) hours as needed for mild pain or moderate pain.    Yes [provider]  clonazePAM (KLONOPIN) 1 MG tablet Take 1 tablet (1 mg total) by mouth 3 (three) times daily as needed. Patient taking differently: Take 1 mg by mouth 3 (three) times daily as needed for anxiety.  09/06/19   Yes Lockamy, Randi L, NP-C  gabapentin (NEURONTIN) 300 MG capsule Take 3 capsules (900 mg total) by mouth 2 (two) times daily. 06/10/19  Yes Lockamy, Randi L, NP-C  glimepiride (AMARYL) 2 MG tablet Take 2 mg by mouth 2 (two) times daily before a meal.    Yes [provider]  magnesium oxide (MAG-OX) 400 (241.3 Mg) MG tablet Take 1 tablet (400 mg total) by mouth 3 (three) times daily. 08/01/19  Yes Derek Jack, MD  megestrol (MEGACE) 400 MG/10ML suspension Take 10 mLs (400 mg total) by mouth 2 (two) times daily. 08/22/19  Yes Derek Jack, MD  mirtazapine (REMERON) 15 MG tablet Take 1 tablet (15 mg total) by mouth at bedtime. 09/05/19  Yes Derek Jack, MD  oxyCODONE (OXYCONTIN) 40 mg 12 hr tablet Take 1 tablet (40 mg total) by mouth every 12 (twelve) hours. 10/10/19  Yes Derek Jack, MD  oxyCODONE (ROXICODONE) 15 MG immediate release tablet Take 1 tablet (15 mg total) by mouth every 4 (four) hours as needed. for pain 08/29/19  Yes Derek Jack, MD  pantoprazole (PROTONIX) 40 MG tablet Take 40 mg by mouth every evening.  12/13/18  Yes [provider]  Probiotic Product (PROBIOTIC DAILY PO) Take 1 capsule by mouth 2 (two) times daily.    Yes [provider]  rivaroxaban (XARELTO) 20 MG TABS tablet Take 1 tablet (20 mg total) by mouth daily with supper. 09/19/18  Yes Rehman, Mechele Dawley, MD  scopolamine (TRANSDERM-SCOP) 1 MG/3DAYS Place 1 patch (1.5 mg total) onto the skin every 3 (three) days. 10/10/19  Yes Derek Jack, MD  vitamin B-12 (CYANOCOBALAMIN) 1000 MCG tablet Take 1,000 mcg by mouth daily.   Yes [provider]  magic mouthwash w/lidocaine SOLN Take 15 mLs by mouth in the morning and at bedtime. Patient not taking: Reported on 10/10/2019 08/15/19   Derek Jack, MD  prochlorperazine (COMPAZINE) 10 MG tablet Take 1 tablet (10 mg total) by mouth every 6 (six) hours as needed for nausea or vomiting. Patient not taking:  Reported on 10/10/2019 08/29/19   Derek Jack, MD    Physical Exam: Vitals:   10/11/19 2100 10/11/19 2130 10/11/19 2200 10/11/19 2230  BP: 118/61 (!) 102/54 (!) 110/59 115/63  Pulse: 98 97 97 94  Resp: 17 19 20  (!) 21  Temp:      TempSrc:      SpO2: 97% 99% 97% 96%  Weight:      Height:        Constitutional: NAD, calm  Eyes: PERTLA, lids and conjunctivae normal ENMT: Mucous membranes are moist. Posterior pharynx clear of any exudate or lesions.  Neck: normal, supple, no masses, no thyromegaly Respiratory: no wheezing, no crackles. No accessory muscle use.  Cardiovascular: S1 & S2 heard, regular rate and rhythm. No extremity edema.   Abdomen: No distension, soft, colostomy noted. Bowel sounds active.  Musculoskeletal: no clubbing / cyanosis. No joint deformity upper and lower extremities.   Skin: no significant rashes, lesions, ulcers. Warm, dry, well-perfused. Neurologic: CN 2-12 grossly intact. Sensation intact. Strength 5/5 in all 4 limbs.  Psychiatric: Alert and oriented to person, place, situation, month, and year. Pleasant and cooperative.    Labs and Imaging on Admission: I have personally reviewed following labs and imaging studies  CBC: Recent Labs  Lab 10/10/19 0917 10/11/19 1925  WBC 9.7 9.1  NEUTROABS 7.8* 7.4  HGB 11.6* 10.8*  HCT 35.9* 33.6*  MCV 85.7 86.8  PLT 187 578   Basic Metabolic Panel: Recent Labs  Lab 10/10/19 0917 10/11/19 1925  NA 132* 132*  K 3.9 4.1  CL 96* 94*  CO2 25 27  GLUCOSE 155* 112*  BUN 11 14  CREATININE 0.62 0.84  CALCIUM 8.4* 8.2*  MG 1.7  --    GFR: Estimated Creatinine Clearance: 76.7 mL/min (by C-G formula based on SCr of 0.84 mg/dL). Liver Function Tests: Recent Labs  Lab 10/10/19 0917 10/11/19 1925  AST 31 32  ALT 25 27  ALKPHOS 124 113  BILITOT 2.6* 1.4*  PROT 6.5 6.5  ALBUMIN 2.8* 2.7*   No results for input(s): LIPASE, AMYLASE in the last 168 hours. No results for input(s): AMMONIA in the  last 168 hours. Coagulation Profile: Recent Labs  Lab 10/11/19 1925  INR 6.0*   Cardiac Enzymes: No results for input(s): CKTOTAL, CKMB, CKMBINDEX, TROPONINI in the last 168 hours. BNP (last 3 results) No results for input(s): PROBNP in the last 8760 hours. HbA1C: No results for input(s): HGBA1C in the last 72 hours. CBG: No results for input(s): GLUCAP in the last 168 hours. Lipid Profile: No results for input(s): CHOL, HDL, LDLCALC, TRIG, CHOLHDL, LDLDIRECT in the last 72 hours. Thyroid Function Tests: No results for input(s): TSH, T4TOTAL, FREET4, T3FREE, THYROIDAB in the last 72 hours. Anemia Panel: No results for input(s): VITAMINB12, FOLATE, FERRITIN, TIBC, IRON, RETICCTPCT in the last 72 hours. Urine analysis:    Component Value Date/Time   COLORURINE AMBER (A) 09/13/2019 0932   APPEARANCEUR CLEAR 09/13/2019 0932   LABSPEC 1.023 09/13/2019 0932   PHURINE 5.0 09/13/2019 0932   GLUCOSEU NEGATIVE 09/13/2019 0932   HGBUR NEGATIVE 09/13/2019 0932   BILIRUBINUR NEGATIVE 09/13/2019 0932   KETONESUR NEGATIVE 09/13/2019 0932   PROTEINUR 30 (A) 09/13/2019 0932   UROBILINOGEN 1.0 11/14/2014 1504   NITRITE NEGATIVE 09/13/2019 0932   LEUKOCYTESUR NEGATIVE 09/13/2019 0932   Sepsis Labs: @LABRCNTIP (procalcitonin:4,lacticidven:4) ) Recent Results (from the past 240 hour(s))  SARS Coronavirus 2 by RT PCR (hospital order, performed in [facility] hospital lab) Nasopharyngeal Nasopharyngeal Swab     Status: None   Collection Time: 10/11/19  7:26 PM   Specimen: Nasopharyngeal Swab  Result Value Ref Range Status   SARS Coronavirus 2 NEGATIVE NEGATIVE Final    Comment: (NOTE) SARS-CoV-2 target nucleic acids are NOT DETECTED. The SARS-CoV-2 RNA is generally detectable in upper and lower respiratory specimens during the acute phase of infection. The lowest concentration of SARS-CoV-2 viral copies this assay can detect is 250 copies / mL. A negative result does not preclude  SARS-CoV-2 infection and should not be used as the sole basis for treatment or other patient  management decisions.  A negative result may occur with improper specimen collection / handling, submission of specimen other than nasopharyngeal swab, presence of viral mutation(s) within the areas targeted by this assay, and inadequate number of viral copies (<250 copies / mL). A negative result must be combined with clinical observations, patient history, and epidemiological information. Fact Sheet for Patients:   StrictlyIdeas.no Fact Sheet for Healthcare Providers: BankingDealers.co.za This test is not yet approved or cleared  by the [place] FDA and has been authorized for detection and/or diagnosis of SARS-CoV-2 by FDA under an Emergency Use Authorization (EUA).  This EUA will remain in effect (meaning this test can be used) for the duration of the COVID-19 declaration under Section 564(b)(1) of the Act, 21 U.S.C. section 360bbb-3(b)(1), unless the authorization is terminated or revoked sooner. Performed at [facility], [address], [place], [place] [address]      Radiological Exams on Admission: CT Head Wo Contrast  Result Date: 10/11/2019 CLINICAL DATA:  69 year old male with history of generalize weakness. In cephalopathy. EXAM: CT HEAD WITHOUT CONTRAST TECHNIQUE: Contiguous axial images were obtained from the base of the skull through the vertex without intravenous contrast. COMPARISON:  Head CT 06/17/2016. FINDINGS: Brain: Mild cerebral atrophy patchy and confluent areas of decreased attenuation are noted throughout the deep and periventricular white matter of the cerebral hemispheres bilaterally, compatible with chronic microvascular ischemic disease. No evidence of acute infarction, hemorrhage, hydrocephalus, extra-axial collection or mass lesion/mass effect. Vascular: No hyperdense vessel or unexpected calcification. Skull: Normal.  Negative for fracture or focal lesion. Sinuses/Orbits: No acute finding. Other: None. IMPRESSION: 1. No acute intracranial abnormalities. 2. Mild cerebral atrophy with chronic microvascular ischemic changes in the cerebral white matter, as above. Electronically Signed   By: Vinnie Langton M.D.   On: 10/11/2019 20:19   DG Chest Port 1 View  Result Date: 10/11/2019 CLINICAL DATA:  69 year old male with history of fever. Generalized weakness. EXAM: PORTABLE CHEST 1 VIEW COMPARISON:  Chest x-ray 07/20/2019. FINDINGS: Right subclavian single-lumen power porta cath with tip terminating at the superior cavoatrial junction. Lung volumes are normal. No consolidative airspace disease. No pleural effusions. No pneumothorax. No pulmonary nodule or mass noted. Pulmonary vasculature and the cardiomediastinal silhouette are within normal limits. IMPRESSION: 1.  No radiographic evidence of acute cardiopulmonary disease. Electronically Signed   By: Vinnie Langton M.D.   On: 10/11/2019 19:42    EKG: Independently reviewed. Sinus tachycardia (rate 109), PVC.   Assessment/Plan   1. SIRS  - Presents with malaise, fatigue, and confusion, and is found to have fever, tachycardia, and elevated lactate with no acute findings on CXR and UA still pending  - There is no meningismus or wounds/rash, COVID pcr is negative, abdominal pain is reported to be chronic and unchanged, and UA still pending  - Blood cultures were collected in ED, lactate normalized with 1 liter NS, and he was started on empiric antibiotic  - Follow-up UA, continue empiric treatment with broad-spectrum antibiotics, follow cultures and clinical course    2. Metastatic colorectal cancer  - Follows with oncology locally, has had resection, radiation, and chemotherapy, has known metastases to pancreas, liver, and lung, and recently started [place] but developed N/V and is not sure if he wants to continue    3. Acute encephalopathy   - Patient was  lethargic and confused the morning of his admission per report of his wife - He is alert and fully oriented by time of admission, was likely related to  fever/suspected infection  - Ammonia level and UA are pending, continue to treat underlying illness    4. Coagulopathy; history of PE  - Patient is on Xarelto for hx of PE and has INR 6 in ED without bleeding  - Hole Xarelto, repeat INR in am    5. Type II DM  - A1c was 6.4% in March 2021  - Check  CBGs and use a low-intensity SSI    6. Chronic pain  - Continues to have abdominal pain that is controlled with his home medications, will continue home-regimen   7. Hyponatremia  - Serum sodium is 132 in setting of hypovolemia - He was given a liter of NS in ED and continued on NS infusion  - Repeat chem panel in am    DVT prophylaxis: Xarelto pta, held d/t INR 6 in ED  Code Status: Full  Family Communication: Discussed with patient  Disposition Plan:  Patient is from: Home  Anticipated d/c is to: TBD Anticipated d/c date is: 10/14/19 Patient currently: Pending further evaluation and management of SIRS/suspected severe sepsis   Consults called: None  Admission status: Inpatient     Vianne Bulls, MD [facility] Pager: See [email or web address]  If 7AM-7PM, please contact the daytime attending [email or web address]  10/11/2019, 11:57 PM

## 2019-10-11 NOTE — ED Notes (Signed)
Date and time results received: 10/11/19   Test: Lactic Acid Critical Value: 2.5  Name of Provider Notified: Zackowski MD  Orders Received? Or Actions Taken?: N/A

## 2019-10-11 NOTE — ED Provider Notes (Signed)
[facility] EMERGENCY DEPARTMENT Provider Note   CSN: [id number] Arrival date & time: 10/11/19  1824     History Chief Complaint  Patient presents with  . Weakness    Charles [name] is a 69 y.o. male with a history of metastatic colorectal cancer, diabetes, GERD, hypertension peripheral vascular disease, under the care of Dr. [name] currently receiving treatment with Stivarga presenting with altered mental status.  He was seen by Dr. Raliegh Ip in his office yesterday secondary to generalized fatigue but had no problems with confusion at that time.  This morning per his wife, he very suddenly became confused and could not remember what meal he was eating and was unable to dress himself or do other ADLs which yesterday he was able to perform.  He has had no other specific symptoms, wife was unaware of any fever, also no cough, nausea or vomiting, no complaints of chest pain or abdominal pain.  He does have a colostomy with no change in output.  He has had no treatment prior to arrival.  Per wife, she removed a flat but fairly embedded tick from his right abdomen 2 days ago.   Level 5 caveat secondary to AMS  The history is provided by the spouse. The history is limited by the condition of the patient.       Past Medical History:  Diagnosis Date  . Chronic anticoagulation   . Colon cancer ([place]) 07/24/2010   rectal ca, inv adenocarcinoma  . Depression 04/01/2011  . Diabetes mellitus without complication ([place])   . DVT (deep venous thrombosis) ([place]) 05/09/2011  . GERD (gastroesophageal reflux disease)   . High output ileostomy ([place]) 05/21/2011  . History of kidney stones   . HTN (hypertension)   . Hx of radiation therapy 09/02/10 to 10/14/10   pelvis  . Lung metastasis ([place])   . Neuropathy   . Peripheral vascular disease ([place])    dvt's,pe  . Pneumonia    hx x3  . Pulmonary embolism ([place])   . Rectal cancer ([name]) 01/15/2011   S/P radiation and concurrent 5-FU continuous infusion from  09/09/10- 10/10/10.  S/P proctectomy with colorectal anastomosis and diverting loop ileostomy on 11/14/10 at [facility] by Dr. Harlon Ditty. Pathology reveals a pT3b N1 with 3/20 lymph nodes.      Patient Active Problem List   Diagnosis Date Noted  . Hyperbilirubinemia 07/17/2019  . Dilation of biliary tract 07/17/2019  . Elevated LFTs   . Abdominal pain 07/16/2019  . Elevated bilirubin 07/16/2019  . Sepsis ([place]) 02/23/2019  . Nausea, vomiting and diarrhea   . Metastatic colorectal cancer ([place])   . Anastomotic stricture of colorectal region   . Abnormal CT scan, colon   . Diarrhea in adult patient 09/22/2018  . History of pulmonary embolism 09/22/2018  . Hypokalemia 09/22/2018  . Type 2 diabetes mellitus ([place]) 09/22/2018  . Hypomagnesemia 09/22/2018  . Dilated gallbladder 09/22/2018  . Goals of care, counseling/discussion 09/20/2018  . Fecal incontinence 09/23/2017  . Rectal pain 05/30/2016  . Hiatal hernia   . Schatzki's ring   . GI bleed 11/23/2014  . Gastritis 11/23/2014  . S/P thoracotomy 11/23/2014  . S/P partial lobectomy of lung 11/15/2014  . DVT (deep venous thrombosis) ([place])   . Lung nodule   . Lung nodule, solitary   . Lower abdominal pain 09/08/2014  . Urinary retention 09/08/2014  . Diarrhea 08/01/2014  . 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 Stiffness of joint, not elsewhere classified,  ankle and foot 01/31/2014  . Weakness of both legs 01/31/2014  . Hx of radiation therapy   . Dehydration 03/10/2011  . Rectal cancer ([place]) 01/15/2011  . Colon cancer ([place]) 07/24/2010  . GERD 07/11/2010    Past Surgical History:  Procedure Laterality Date  . BILIARY STENT PLACEMENT N/A 09/16/2018   Procedure: STENT PLACEMENT;  Surgeon: Rogene Houston, MD;  Location: [facility];  Service: Endoscopy;  Laterality: N/A;  . BILIARY STENT PLACEMENT N/A 07/18/2019   Procedure: BILIARY STENT PLACEMENT;  Surgeon: Rogene Houston, MD;  Location: [facility] ENDO  SUITE;  Service: Endoscopy;  Laterality: N/A;  . BIOPSY  09/24/2018   Procedure: BIOPSY;  Surgeon: Danie Binder, MD;  Location: [facility] END[facility];  Service: Endoscopy;;  . COLON SURGERY  11/14/2010   proctectomy with colorectal anastomosis and diverting loop ileostomy (temporary planned)  . COLONOSCOPY  07/2010   proximal rectal apple core mass 10-14cm from anal verge (adenocarcinoma), 2-3cm distal rectal carpet polyp s/p piecemeal snare polypectomy (adenoma)  . COLONOSCOPY  04/21/2012   RMR: Friable,fibrotic appearing colorectal anastomosis producing some luminal narrowing-not felt to be critical. path: focal erosion with slight inflammation and hyperemia. SURVEILLANCE DUE DEC 2015  . COLONOSCOPY N/A 11/24/2013   Dr. Rourk:somewhat fibrotic/friable anastomotic mucosal-status post biopsy (narrowing not felt to be clinically significant) Single colonic diverticulum. benign polypoid rectal mucosa  . colostomy reversal  april 2013  . ERCP N/A 09/16/2018   Procedure: ENDOSCOPIC RETROGRADE CHOLANGIOPANCREATOGRAPHY (ERCP);  Surgeon: Rogene Houston, MD;  Location[facility];  Service: Endoscopy;  Laterality: N/A;  . ERCP N/A 07/18/2019   Procedure: ENDOSCOPIC RETROGRADE CHOLANGIOPANCREATOGRAPHY (ERCP);  Surgeon: Rogene Houston, MD;  [facility];  Service: Endoscopy;  Laterality: N/A;  . ESOPHAGOGASTRODUODENOSCOPY  07/2010   RMR: schatki ring s/p dilation, small hh, SB bx benign  . ESOPHAGOGASTRODUODENOSCOPY N/A 09/04/2015   Procedure: ESOPHAGOGASTRODUODENOSCOPY (EGD);  Surgeon: Daneil Dolin, MD;  Location[facility];  Service: Endoscopy;  Laterality: N/A;  730  . EUS  08/2010   Dr. Owens Loffler. uT3N0 circumferential, nearly obstruction rectosigmoid adenocarcinoma, distal edge 12cm from anal verge  . EUS N/A 06/19/2016   Procedure: UPPER ENDOSCOPIC ULTRASOUND (EUS) RADIAL;  Surgeon: Milus Banister, MD;  Location: [facility] ENDOSCOPY;  Service: Endoscopy;  Laterality: N/A;  . FLEXIBLE  SIGMOIDOSCOPY N/A 09/24/2018   Procedure: FLEXIBLE SIGMOIDOSCOPY;  Surgeon: Danie Binder, MD;  Location[facility];  Service: Endoscopy;  Laterality: N/A;  . HERNIA REPAIR     abd hernia repair  . IVC filter    . ivc filter    . port a cath placement    . PORT-A-CATH REMOVAL  09/24/2011   Procedure: REMOVAL PORT-A-CATH;  Surgeon: Donato Heinz, MD;  Location: [facility] ORS;  Service: General;  Laterality: N/A;  Minor Room  . PORTACATH PLACEMENT Right 07/07/2016   Procedure: INSERTION PORT-A-CATH;  Surgeon: Aviva Signs, MD;  Location: [facility] ORS;  Service: General;  Laterality: Right;  . SPHINCTEROTOMY N/A 09/16/2018   Procedure: SPHINCTEROTOMY with balloon dialation;  Surgeon: Rogene Houston, MD;  Location[facility];  Service: Endoscopy;  Laterality: N/A;  . TRANSVERSE LOOP COLOSTOMY N/A 09/29/2018   Procedure: TRANSVERSE LOOP COLOSTOMY;  Surgeon: Aviva Signs, MD;  Location: [facility] ORS;  Service: General;  Laterality: N/A;  . VIDEO ASSISTED THORACOSCOPY (VATS)/WEDGE RESECTION Right 11/15/2014   Procedure: VIDEO ASSISTED THORACOSCOPY (VATS)/LUNG RESECTION WITH RIGHT LINGULECTOMY;  Surgeon: [name], MD;  Location: [facility]  OR;  Service: Thoracic;  Laterality: Right;  Marland Kitchen VIDEO BRONCHOSCOPY N/A 11/15/2014   Procedure: VIDEO BRONCHOSCOPY;  Surgeon: [name], MD;  Location: [facility] OR;  Service: Thoracic;  Laterality: N/A;       Family History  Problem Relation Age of Onset  . Cancer Brother        throat  . Cancer Brother        prostate  . Colon cancer Neg Hx   . Liver disease Neg Hx   . Inflammatory bowel disease Neg Hx     Social History   Tobacco Use  . Smoking status: Never Smoker  . Smokeless tobacco: Never Used  Substance Use Topics  . Alcohol use: No  . Drug use: No    Home Medications Prior to Admission medications   Medication Sig Start Date End Date Taking? Authorizing Provider  acetaminophen (TYLENOL) 500 MG tablet Take 1,000 mg by mouth every 6 (six) hours as  needed for mild pain or moderate pain.    Yes [provider]  clonazePAM (KLONOPIN) 1 MG tablet Take 1 tablet (1 mg total) by mouth 3 (three) times daily as needed. Patient taking differently: Take 1 mg by mouth 3 (three) times daily as needed for anxiety.  09/06/19  Yes Lockamy, Randi L, NP-C  gabapentin (NEURONTIN) 300 MG capsule Take 3 capsules (900 mg total) by mouth 2 (two) times daily. 06/10/19  Yes Lockamy, Randi L, NP-C  glimepiride (AMARYL) 2 MG tablet Take 2 mg by mouth 2 (two) times daily before a meal.    Yes [provider]  magnesium oxide (MAG-OX) 400 (241.3 Mg) MG tablet Take 1 tablet (400 mg total) by mouth 3 (three) times daily. 08/01/19  Yes Derek Jack, MD  megestrol (MEGACE) 400 MG/10ML suspension Take 10 mLs (400 mg total) by mouth 2 (two) times daily. 08/22/19  Yes Derek Jack, MD  mirtazapine (REMERON) 15 MG tablet Take 1 tablet (15 mg total) by mouth at bedtime. 09/05/19  Yes Derek Jack, MD  oxyCODONE (OXYCONTIN) 40 mg 12 hr tablet Take 1 tablet (40 mg total) by mouth every 12 (twelve) hours. 10/10/19  Yes Derek Jack, MD  oxyCODONE (ROXICODONE) 15 MG immediate release tablet Take 1 tablet (15 mg total) by mouth every 4 (four) hours as needed. for pain 08/29/19  Yes Derek Jack, MD  pantoprazole (PROTONIX) 40 MG tablet Take 40 mg by mouth every evening.  12/13/18  Yes [provider]  Probiotic Product (PROBIOTIC DAILY PO) Take 1 capsule by mouth 2 (two) times daily.    Yes [provider]  rivaroxaban (XARELTO) 20 MG TABS tablet Take 1 tablet (20 mg total) by mouth daily with supper. 09/19/18  Yes Rehman, Mechele Dawley, MD  scopolamine (TRANSDERM-SCOP) 1 MG/3DAYS Place 1 patch (1.5 mg total) onto the skin every 3 (three) days. 10/10/19  Yes Derek Jack, MD  vitamin B-12 (CYANOCOBALAMIN) 1000 MCG tablet Take 1,000 mcg by mouth daily.   Yes [provider]  magic mouthwash w/lidocaine SOLN Take 15  mLs by mouth in the morning and at bedtime. Patient not taking: Reported on 10/10/2019 08/15/19   Derek Jack, MD  prochlorperazine (COMPAZINE) 10 MG tablet Take 1 tablet (10 mg total) by mouth every 6 (six) hours as needed for nausea or vomiting. Patient not taking: Reported on 10/10/2019 08/29/19   Derek Jack, MD    Allergies    Oxaliplatin, Oxycodone, Tramadol, and Trazodone and nefazodone  Review of Systems   Review of  Systems  Unable to perform ROS: Mental status change  Constitutional: Positive for fatigue and fever.  Psychiatric/Behavioral: Positive for confusion.    Physical Exam Updated Vital Signs BP 115/63   Pulse 94   Temp 98.5 F (36.9 C) (Oral)   Resp (!) 21   Ht 6\' 2"  (1.88 m)   Wt 65.3 kg   SpO2 96%   BMI 18.49 kg/m   Physical Exam Vitals and nursing note reviewed.  Constitutional:      General: He is not in acute distress.    Appearance: He is well-developed.  HENT:     Head: Normocephalic and atraumatic.     Mouth/Throat:     Mouth: Mucous membranes are dry.  Eyes:     Pupils: Pupils are equal, round, and reactive to light.  Cardiovascular:     Rate and Rhythm: Regular rhythm. Tachycardia present.     Heart sounds: Normal heart sounds.  Pulmonary:     Effort: Pulmonary effort is normal. No respiratory distress.     Breath sounds: Normal breath sounds. No wheezing.  Abdominal:     General: Bowel sounds are normal.     Palpations: Abdomen is soft.     Tenderness: There is no abdominal tenderness. There is no guarding.     Comments: Stoma with granulation tissue around border.  Soft brown stool in bag.  Musculoskeletal:        General: No swelling or tenderness.     Cervical back: Normal range of motion.  Skin:    General: Skin is warm and dry.     Capillary Refill: Capillary refill takes less than 2 seconds.  Neurological:     Mental Status: He is disoriented.     ED Results / Procedures / Treatments   Labs (all labs ordered  are listed, but only abnormal results are displayed) Labs Reviewed  LACTIC ACID, PLASMA - Abnormal; Notable for the following components:      Result Value   Lactic Acid, Venous 2.5 (*)    All other components within normal limits  COMPREHENSIVE METABOLIC PANEL - Abnormal; Notable for the following components:   Sodium 132 (*)    Chloride 94 (*)    Glucose, Bld 112 (*)    Calcium 8.2 (*)    Albumin 2.7 (*)    Total Bilirubin 1.4 (*)    All other components within normal limits  CBC WITH DIFFERENTIAL/PLATELET - Abnormal; Notable for the following components:   RBC 3.87 (*)    Hemoglobin 10.8 (*)    HCT 33.6 (*)    All other components within normal limits  APTT - Abnormal; Notable for the following components:   aPTT 116 (*)    All other components within normal limits  PROTIME-INR - Abnormal; Notable for the following components:   Prothrombin Time 51.6 (*)    INR 6.0 (*)    All other components within normal limits  SARS CORONAVIRUS 2 BY RT PCR (HOSPITAL ORDER, [facility])  CULTURE, BLOOD (ROUTINE X 2)  CULTURE, BLOOD (ROUTINE X 2)  URINE CULTURE  LACTIC ACID, PLASMA  URINALYSIS, ROUTINE W REFLEX MICROSCOPIC  AMMONIA    EKG EKG Interpretation  Date/Time:  Tuesday October 11 2019 18:36:55 EDT Ventricular Rate:  109 PR Interval:    QRS Duration: 90 QT Interval:  304 QTC Calculation: 410 R Axis:   58 Text Interpretation: Sinus tachycardia Ventricular premature complex No significant change since last tracing Confirmed by Fredia Sorrow (  [id number]) on 10/11/2019 6:53:50 PM   Radiology CT Head Wo Contrast  Result Date: 10/11/2019 CLINICAL DATA:  69 year old male with history of generalize weakness. In cephalopathy. EXAM: CT HEAD WITHOUT CONTRAST TECHNIQUE: Contiguous axial images were obtained from the base of the skull through the vertex without intravenous contrast. COMPARISON:  Head CT 06/17/2016. FINDINGS: Brain: Mild cerebral atrophy patchy and  confluent areas of decreased attenuation are noted throughout the deep and periventricular white matter of the cerebral hemispheres bilaterally, compatible with chronic microvascular ischemic disease. No evidence of acute infarction, hemorrhage, hydrocephalus, extra-axial collection or mass lesion/mass effect. Vascular: No hyperdense vessel or unexpected calcification. Skull: Normal. Negative for fracture or focal lesion. Sinuses/Orbits: No acute finding. Other: None. IMPRESSION: 1. No acute intracranial abnormalities. 2. Mild cerebral atrophy with chronic microvascular ischemic changes in the cerebral white matter, as above. Electronically Signed   By: Vinnie Langton M.D.   On: 10/11/2019 20:19   DG Chest Port 1 View  Result Date: 10/11/2019 CLINICAL DATA:  69 year old male with history of fever. Generalized weakness. EXAM: PORTABLE CHEST 1 VIEW COMPARISON:  Chest x-ray 07/20/2019. FINDINGS: Right subclavian single-lumen power porta cath with tip terminating at the superior cavoatrial junction. Lung volumes are normal. No consolidative airspace disease. No pleural effusions. No pneumothorax. No pulmonary nodule or mass noted. Pulmonary vasculature and the cardiomediastinal silhouette are within normal limits. IMPRESSION: 1.  No radiographic evidence of acute cardiopulmonary disease. Electronically Signed   By: Vinnie Langton M.D.   On: 10/11/2019 19:42    Procedures Procedures (including critical care time)  Medications Ordered in ED Medications  sodium chloride 0.9 % bolus 1,000 mL (0 mLs Intravenous Stopped 10/11/19 2114)  acetaminophen (TYLENOL) tablet 650 mg (650 mg Oral Given 10/11/19 1937)  cefTRIAXone (ROCEPHIN) 1 g in sodium chloride 0.9 % 100 mL IVPB (0 g Intravenous Stopped 10/11/19 2225)    ED Course  I have reviewed the triage vital signs and the nursing notes.  Pertinent labs & imaging results that were available during my care of the patient were reviewed by me and considered in my  medical decision making (see chart for details).    MDM Rules/Calculators/A&P                     Patient has a history of metastatic colorectal cancer, diabetes, currently under the care of Dr. [name], presenting with acute confusion along with fever to 103.3.  Source of fever is currently unclear.  Labs were reviewed, chest x-ray is clear, patient has a normal WBC count, urinalysis pending, blood cultures pending, ammonia pending.  He was given IV fluids and dose of rocephin.  Tylenol given and fever reduced appropriately.    Of note, tick removed 2 days ago, but was flat, took some skin with it when removed.  Doubt was embedded long enough to consider tick borne infection.   Spoke with Dr. Myna Hidalgo who accepts pt for admission. Final Clinical Impression(s) / ED Diagnoses Final diagnoses:  Encephalopathy  Sepsis with encephalopathy without septic shock, due to unspecified organism [facility])    Rx / DC Orders ED Discharge Orders    None       Landis Martins 10/11/19 [id number]    Fredia Sorrow, MD 10/24/19 2328

## 2019-10-11 NOTE — ED Notes (Signed)
Pt. With CT. 

## 2019-10-11 NOTE — ED Triage Notes (Signed)
EMS reports pt c/o generalized weakness.  Reports has cancer.  EMS says wife told them pt was disoriented this morning.  EMS says pt able state name and birthdate.  CBG 200, temp 100.2 temporal, bp 108/65, hr 100, o2 sat 96% on room air.  EMS started 20 g IV in r forearm.

## 2019-10-11 NOTE — ED Notes (Signed)
Date and time results received: 10/11/19  (use smartphrase ".now" to insert current time)  Test: INR Critical Value: 6.0  Name of Provider Notified: Evalee Jefferson PA  Orders Received? Or Actions Taken?: N/A

## 2019-10-12 ENCOUNTER — Other Ambulatory Visit: Payer: Self-pay

## 2019-10-12 DIAGNOSIS — G9340 Encephalopathy, unspecified: Secondary | ICD-10-CM | POA: Insufficient documentation

## 2019-10-12 LAB — CBC
HCT: 32.3 % — ABNORMAL LOW (ref 39.0–52.0)
Hemoglobin: 10.1 g/dL — ABNORMAL LOW (ref 13.0–17.0)
MCH: 27.2 pg (ref 26.0–34.0)
MCHC: 31.3 g/dL (ref 30.0–36.0)
MCV: 87.1 fL (ref 80.0–100.0)
Platelets: 166 10*3/uL (ref 150–400)
RBC: 3.71 MIL/uL — ABNORMAL LOW (ref 4.22–5.81)
RDW: 14.6 % (ref 11.5–15.5)
WBC: 12.6 10*3/uL — ABNORMAL HIGH (ref 4.0–10.5)
nRBC: 0 % (ref 0.0–0.2)

## 2019-10-12 LAB — GLUCOSE, CAPILLARY
Glucose-Capillary: 54 mg/dL — ABNORMAL LOW (ref 70–99)
Glucose-Capillary: 56 mg/dL — ABNORMAL LOW (ref 70–99)
Glucose-Capillary: 58 mg/dL — ABNORMAL LOW (ref 70–99)
Glucose-Capillary: 172 mg/dL — ABNORMAL HIGH (ref 70–99)
Glucose-Capillary: 138 mg/dL — ABNORMAL HIGH (ref 70–99)
Glucose-Capillary: 55 mg/dL — ABNORMAL LOW (ref 70–99)
Glucose-Capillary: 102 mg/dL — ABNORMAL HIGH (ref 70–99)
Glucose-Capillary: 106 mg/dL — ABNORMAL HIGH (ref 70–99)

## 2019-10-12 LAB — URINALYSIS, ROUTINE W REFLEX MICROSCOPIC
Bacteria, UA: NONE SEEN
Bilirubin Urine: NEGATIVE
Glucose, UA: NEGATIVE mg/dL
Hgb urine dipstick: NEGATIVE
Ketones, ur: NEGATIVE mg/dL
Leukocytes,Ua: NEGATIVE
Nitrite: NEGATIVE
Protein, ur: 30 mg/dL — AB
Specific Gravity, Urine: 1.024 (ref 1.005–1.030)
pH: 5 (ref 5.0–8.0)

## 2019-10-12 LAB — COMPREHENSIVE METABOLIC PANEL
ALT: 25 U/L (ref 0–44)
AST: 32 U/L (ref 15–41)
Albumin: 2.3 g/dL — ABNORMAL LOW (ref 3.5–5.0)
Alkaline Phosphatase: 88 U/L (ref 38–126)
Anion gap: 9 (ref 5–15)
BUN: 11 mg/dL (ref 8–23)
CO2: 25 mmol/L (ref 22–32)
Calcium: 7.7 mg/dL — ABNORMAL LOW (ref 8.9–10.3)
Chloride: 99 mmol/L (ref 98–111)
Creatinine, Ser: 0.66 mg/dL (ref 0.61–1.24)
GFR calc Af Amer: >60 mL/min (ref >60)
GFR calc non Af Amer: >60 mL/min (ref >60)
Glucose, Bld: 55 mg/dL — ABNORMAL LOW (ref 70–99)
Potassium: 3.4 mmol/L — ABNORMAL LOW (ref 3.5–5.1)
Sodium: 133 mmol/L — ABNORMAL LOW (ref 135–145)
Total Bilirubin: 1.5 mg/dL — ABNORMAL HIGH (ref 0.3–1.2)
Total Protein: 5.5 g/dL — ABNORMAL LOW (ref 6.5–8.1)

## 2019-10-12 LAB — PROTIME-INR
INR: 5.9 (ref 0.8–1.2)
Prothrombin Time: 51 seconds — ABNORMAL HIGH (ref 11.4–15.2)

## 2019-10-12 LAB — AMMONIA: Ammonia: 22 umol/L (ref 9–35)

## 2019-10-12 MED ORDER — VANCOMYCIN HCL 750 MG/150ML IV SOLN
750.0000 mg | Freq: Two times a day (BID) | INTRAVENOUS | Status: DC
  Administered 2019-10-12 – 2019-10-14 (×5): 750 mg via INTRAVENOUS
  Filled 2019-10-12 (×6): qty 150

## 2019-10-12 MED ORDER — ACETAMINOPHEN 650 MG RE SUPP: 650.0000 mg | Freq: Four times a day (QID) | RECTAL | Status: DC | PRN

## 2019-10-12 MED ORDER — SODIUM CHLORIDE 0.9 % IV BOLUS
500.0000 mL | Freq: Once | INTRAVENOUS | Status: AC
  Administered 2019-10-12: 500 mL via INTRAVENOUS

## 2019-10-12 MED ORDER — VANCOMYCIN HCL IN DEXTROSE 1-5 GM/200ML-% IV SOLN: 1000.0000 mg | Freq: Once | INTRAVENOUS | Status: DC

## 2019-10-12 MED ORDER — CLONAZEPAM 0.5 MG PO TABS
1.0000 mg | ORAL_TABLET | Freq: Three times a day (TID) | ORAL | Status: DC | PRN
  Administered 2019-10-12 – 2019-10-13 (×3): 1 mg via ORAL
  Filled 2019-10-12 (×3): qty 2

## 2019-10-12 MED ORDER — INSULIN ASPART 100 UNIT/ML ~~LOC~~ SOLN
0.0000 [IU] | Freq: Three times a day (TID) | SUBCUTANEOUS | Status: DC
  Administered 2019-10-12: 1 [IU] via SUBCUTANEOUS
  Administered 2019-10-14: 2 [IU] via SUBCUTANEOUS

## 2019-10-12 MED ORDER — MEGESTROL ACETATE 400 MG/10ML PO SUSP
400.0000 mg | Freq: Two times a day (BID) | ORAL | Status: DC
  Administered 2019-10-12 – 2019-10-14 (×5): 400 mg via ORAL
  Filled 2019-10-12 (×5): qty 10

## 2019-10-12 MED ORDER — SODIUM CHLORIDE 0.9 % IV SOLN: INTRAVENOUS | Status: AC

## 2019-10-12 MED ORDER — ONDANSETRON HCL 4 MG/2ML IJ SOLN
4.0000 mg | Freq: Four times a day (QID) | INTRAMUSCULAR | Status: DC | PRN
  Administered 2019-10-12: 4 mg via INTRAVENOUS
  Filled 2019-10-12: qty 2

## 2019-10-12 MED ORDER — OXYCODONE HCL ER 20 MG PO T12A
40.0000 mg | EXTENDED_RELEASE_TABLET | Freq: Two times a day (BID) | ORAL | Status: DC
  Administered 2019-10-12 – 2019-10-13 (×4): 40 mg via ORAL
  Filled 2019-10-12 (×4): qty 2

## 2019-10-12 MED ORDER — MIRTAZAPINE 15 MG PO TABS
15.0000 mg | ORAL_TABLET | Freq: Every day | ORAL | Status: DC
  Administered 2019-10-12 – 2019-10-13 (×3): 15 mg via ORAL
  Filled 2019-10-12 (×3): qty 1

## 2019-10-12 MED ORDER — METRONIDAZOLE IN NACL 5-0.79 MG/ML-% IV SOLN
500.0000 mg | Freq: Three times a day (TID) | INTRAVENOUS | Status: DC
  Administered 2019-10-12 – 2019-10-14 (×8): 500 mg via INTRAVENOUS
  Filled 2019-10-12 (×8): qty 100

## 2019-10-12 MED ORDER — OXYCODONE HCL 5 MG PO TABS
15.0000 mg | ORAL_TABLET | ORAL | Status: DC | PRN
  Administered 2019-10-13: 15 mg via ORAL
  Filled 2019-10-12 (×2): qty 3

## 2019-10-12 MED ORDER — DEXTROSE 50 % IV SOLN
INTRAVENOUS | Status: AC
  Administered 2019-10-12: 50 mL
  Filled 2019-10-12: qty 50

## 2019-10-12 MED ORDER — PANTOPRAZOLE SODIUM 40 MG PO TBEC
40.0000 mg | DELAYED_RELEASE_TABLET | Freq: Every evening | ORAL | Status: DC
  Administered 2019-10-12 – 2019-10-13 (×2): 40 mg via ORAL
  Filled 2019-10-12 (×2): qty 1

## 2019-10-12 MED ORDER — SODIUM CHLORIDE 0.9 % IV SOLN: 2.0000 g | Freq: Once | INTRAVENOUS | Status: DC

## 2019-10-12 MED ORDER — VANCOMYCIN HCL 1250 MG/250ML IV SOLN
1250.0000 mg | INTRAVENOUS | Status: AC
  Administered 2019-10-12: 1250 mg via INTRAVENOUS
  Filled 2019-10-12: qty 250

## 2019-10-12 MED ORDER — SODIUM CHLORIDE 0.9 % IV SOLN
2.0000 g | Freq: Three times a day (TID) | INTRAVENOUS | Status: DC
  Administered 2019-10-12 – 2019-10-14 (×8): 2 g via INTRAVENOUS
  Filled 2019-10-12 (×8): qty 2

## 2019-10-12 MED ORDER — GABAPENTIN 300 MG PO CAPS
900.0000 mg | ORAL_CAPSULE | Freq: Two times a day (BID) | ORAL | Status: DC
  Administered 2019-10-12 – 2019-10-14 (×5): 900 mg via ORAL
  Filled 2019-10-12 (×5): qty 3

## 2019-10-12 MED ORDER — ACETAMINOPHEN 325 MG PO TABS
650.0000 mg | ORAL_TABLET | Freq: Four times a day (QID) | ORAL | Status: DC | PRN
  Administered 2019-10-12 – 2019-10-13 (×2): 650 mg via ORAL
  Filled 2019-10-12 (×2): qty 2

## 2019-10-12 MED ORDER — ONDANSETRON HCL 4 MG PO TABS: 4.0000 mg | ORAL_TABLET | Freq: Four times a day (QID) | ORAL | Status: DC | PRN

## 2019-10-12 NOTE — Progress Notes (Signed)
PROGRESS NOTE    Charles [name]  WER:[id number] DOB: 1951/03/01 DOA: 10/11/2019 PCP: [name], MD    Brief Narrative:  HPI: Charles [name] is a 69 y.o. male with medical history significant for colorectal cancer with metastases to pancreas, liver, and lungs, type 2 diabetes mellitus, history of PE now on Xarelto, and chronic pain related to his cancer, now presenting to the emergency department for evaluation of malaise, fatigue, and confusion.  Patient was recently started on [place] by his oncologist, developed persistent nausea and vomiting, was treated with IV fluids and scopolamine patch at the cancer center on 10/10/2019, but then this morning per report of his wife, he had increased fatigue and was confused.  Patient is normally fully oriented and able to take care of his own ADLs, but was disoriented this morning and requiring assistance with dressing and eating.  Patient complained of general malaise but nothing else.  By time of admission, the patient is oriented and able to provide a history, reporting that his chronic right-sided abdominal pain is unchanged, he does not have any cough or shortness of breath, and he has not noticed any change in his stool.  He denies headache, chest pain, or palpitations   Assessment & Plan:   Principal Problem:   SIRS (systemic inflammatory response syndrome) (HCC) Active Problems:   History of pulmonary embolism   Type 2 diabetes mellitus ([place])   Metastatic colorectal cancer ([place])   Acute encephalopathy   Hyponatremia   Coagulopathy ([place])   1. SIRS.  Currently, does not appear to be in obvious source of sepsis at this time.  He was febrile on admission which has since improved.  We will continue intravenous antibiotics for now until culture results are found to be negative.  Patient is at high risk for bacteremia due to presence of Port-A-Cath. 2. Metastatic rectal cancer.  Status post resection, radiation and chemotherapy.  He has  known metastases to pancreas, liver and lung.  He is no longer on IV chemotherapy.  Recently started on oral chemotherapy which she did not tolerate and stopped taking few days ago.  He decided not to continue with further chemotherapy.  Palliative care consulted to help address further goals of care. 3. Acute encephalopathy.  Suspect this related to his fever and acute illness.  Appears to be improving. 4. History of PE, chronically on Xarelto.  INR is supratherapeutic. 5. Diabetes.  He was having episodes of hypoglycemia this morning.  Continue to monitor on sliding scale insulin. 6. Chronic pain.  He is chronically on oxycodone.  Continue the same.   DVT prophylaxis: INR supratherapeutic.  Chronically on Xarelto which is currently on hold. Code Status: Full code Family Communication: Discussed with wife and daughter at the bedside Disposition Plan: Status is: Inpatient  Remains inpatient appropriate because:Ongoing diagnostic testing needed not appropriate for outpatient work up   Dispo: The patient is from: Home              Anticipated d/c is to: Home              Anticipated d/c date is: 1 day              Patient currently is not medically stable to d/c.    Consultants:   Palliative care  Procedures:     Antimicrobials:   Vancomycin 6/8 >  Cefepime 6/8 >  Flagyl 6/8 >   Subjective: Patient is feeling better than he did on admission.  Family feels that he is still mildly confused although he appears to be improving.  Denies any cough or shortness of breath.  No chest pain.  He did have some vomiting when he was taking his oral chemotherapy, but that has since resolved.  He reports that he has not noted increased stool output through ostomy.  Objective: Vitals:   10/12/19 0730 10/12/19 1130 10/12/19 1242 10/12/19 1404  BP: 113/67 112/70  129/64  Pulse: (!) 109 93  100  Resp: 16     Temp: 99.2 F (37.3 C) 98.3 F (36.8 C) 99.8 F (37.7 C) 98.4 F (36.9 C)    TempSrc: Oral Oral Oral Oral  SpO2: 99% 100%  98%  Weight:      Height:        Intake/Output Summary (Last 24 hours) at 10/12/2019 1752 Last data filed at 10/12/2019 1514 Gross per 24 hour  Intake 3241.66 ml  Output 320 ml  Net 2921.66 ml   Filed Weights   10/11/19 1844 10/12/19 0050  Weight: 65.3 kg 67.7 kg    Examination:  General exam: Appears calm and comfortable  Respiratory system: Clear to auscultation. Respiratory effort normal. Cardiovascular system: S1 & S2 heard, RRR. No JVD, murmurs, rubs, gallops or clicks. No pedal edema. Gastrointestinal system: Abdomen is nondistended, soft and nontender. No organomegaly or masses felt. Normal bowel sounds heard.  Ostomy in left lower quadrant. Central nervous system: Alert and oriented. No focal neurological deficits. Extremities: Symmetric 5 x 5 power. Skin: No rashes, lesions or ulcers Psychiatry: Judgement and insight appear normal. Mood & affect appropriate.     Data Reviewed: I have personally reviewed following labs and imaging studies  CBC: Recent Labs  Lab 10/10/19 0917 10/11/19 1925 10/12/19 0545  WBC 9.7 9.1 12.6*  NEUTROABS 7.8* 7.4  --   HGB 11.6* 10.8* 10.1*  HCT 35.9* 33.6* 32.3*  MCV 85.7 86.8 87.1  PLT 187 188 829   Basic Metabolic Panel: Recent Labs  Lab 10/10/19 0917 10/11/19 1925 10/12/19 0545  NA 132* 132* 133*  K 3.9 4.1 3.4*  CL 96* 94* 99  CO2 25 27 25   GLUCOSE 155* 112* 55*  BUN 11 14 11   CREATININE 0.62 0.84 0.66  CALCIUM 8.4* 8.2* 7.7*  MG 1.7  --   --    GFR: Estimated Creatinine Clearance: 83.4 mL/min (by C-G formula based on SCr of 0.66 mg/dL). Liver Function Tests: Recent Labs  Lab 10/10/19 0917 10/11/19 1925 10/12/19 0545  AST 31 32 32  ALT 25 27 25   ALKPHOS 124 113 88  BILITOT 2.6* 1.4* 1.5*  PROT 6.5 6.5 5.5*  ALBUMIN 2.8* 2.7* 2.3*   No results for input(s): LIPASE, AMYLASE in the last 168 hours. Recent Labs  Lab 10/12/19 0011  AMMONIA 22   Coagulation  Profile: Recent Labs  Lab 10/11/19 1925 10/12/19 0545  INR 6.0* 5.9*   Cardiac Enzymes: No results for input(s): CKTOTAL, CKMB, CKMBINDEX, TROPONINI in the last 168 hours. BNP (last 3 results) No results for input(s): PROBNP in the last 8760 hours. HbA1C: No results for input(s): HGBA1C in the last 72 hours. CBG: Recent Labs  Lab 10/12/19 0732 10/12/19 0758 10/12/19 0822 10/12/19 0922 10/12/19 1127  GLUCAP 58* 54* 56* 172* 138*   Lipid Profile: No results for input(s): CHOL, HDL, LDLCALC, TRIG, CHOLHDL, LDLDIRECT in the last 72 hours. Thyroid Function Tests: No results for input(s): TSH, T4TOTAL, FREET4, T3FREE, THYROIDAB in the last 72 hours. Anemia Panel: No results  for input(s): VITAMINB12, FOLATE, FERRITIN, TIBC, IRON, RETICCTPCT in the last 72 hours. Sepsis Labs: Recent Labs  Lab 10/11/19 1955 10/11/19 2129  LATICACIDVEN 2.5* 1.3    Recent Results (from the past 240 hour(s))  Blood Culture (routine x 2)     Status: None (Preliminary result)   Collection Time: 10/11/19  7:25 PM   Specimen: BLOOD  Result Value Ref Range Status   Specimen Description BLOOD RIGHT ANTECUBITAL  Final   Special Requests   Final    BOTTLES DRAWN AEROBIC AND ANAEROBIC Blood Culture adequate volume   Culture   Final    NO GROWTH < 12 HOURS Performed at [facility], [address], [place], [place] [address]    Report Status PENDING  Incomplete  SARS Coronavirus 2 by RT PCR (hospital order, performed in [place] hospital lab) Nasopharyngeal Nasopharyngeal Swab     Status: None   Collection Time: 10/11/19  7:26 PM   Specimen: Nasopharyngeal Swab  Result Value Ref Range Status   SARS Coronavirus 2 NEGATIVE NEGATIVE Final    Comment: (NOTE) SARS-CoV-2 target nucleic acids are NOT DETECTED. The SARS-CoV-2 RNA is generally detectable in upper and lower respiratory specimens during the acute phase of infection. The lowest concentration of SARS-CoV-2 viral copies this assay can detect  is 250 copies / mL. A negative result does not preclude SARS-CoV-2 infection and should not be used as the sole basis for treatment or other patient management decisions.  A negative result may occur with improper specimen collection / handling, submission of specimen other than nasopharyngeal swab, presence of viral mutation(s) within the areas targeted by this assay, and inadequate number of viral copies (<250 copies / mL). A negative result must be combined with clinical observations, patient history, and epidemiological information. Fact Sheet for Patients:   StrictlyIdeas.no Fact Sheet for Healthcare Providers: BankingDealers.co.za This test is not yet approved or cleared  by the [place] FDA and has been authorized for detection and/or diagnosis of SARS-CoV-2 by FDA under an Emergency Use Authorization (EUA).  This EUA will remain in effect (meaning this test can be used) for the duration of the COVID-19 declaration under Section 564(b)(1) of the Act, 21 U.S.C. section 360bbb-3(b)(1), unless the authorization is terminated or revoked sooner. Performed at [facility], [address], [place], [place] [address]   Blood Culture (routine x 2)     Status: None (Preliminary result)   Collection Time: 10/11/19  7:55 PM   Specimen: BLOOD LEFT HAND  Result Value Ref Range Status   Specimen Description BLOOD LEFT HAND  Final   Special Requests   Final    BOTTLES DRAWN AEROBIC ONLY Blood Culture adequate volume   Culture   Final    NO GROWTH < 12 HOURS Performed at [facility], [address] [place], [place] [address]    Report Status PENDING  Incomplete         Radiology Studies: CT Head Wo Contrast  Result Date: 10/11/2019 CLINICAL DATA:  69 year old male with history of generalize weakness. In cephalopathy. EXAM: CT HEAD WITHOUT CONTRAST TECHNIQUE: Contiguous axial images were obtained from the base of the skull through the  vertex without intravenous contrast. COMPARISON:  Head CT 06/17/2016. FINDINGS: Brain: Mild cerebral atrophy patchy and confluent areas of decreased attenuation are noted throughout the deep and periventricular white matter of the cerebral hemispheres bilaterally, compatible with chronic microvascular ischemic disease. No evidence of acute infarction, hemorrhage, hydrocephalus, extra-axial collection or mass lesion/mass effect. Vascular:  No hyperdense vessel or unexpected calcification. Skull: Normal. Negative for fracture or focal lesion. Sinuses/Orbits: No acute finding. Other: None. IMPRESSION: 1. No acute intracranial abnormalities. 2. Mild cerebral atrophy with chronic microvascular ischemic changes in the cerebral white matter, as above. Electronically Signed   By: Vinnie Langton M.D.   On: 10/11/2019 20:19   DG Chest Port 1 View  Result Date: 10/11/2019 CLINICAL DATA:  69 year old male with history of fever. Generalized weakness. EXAM: PORTABLE CHEST 1 VIEW COMPARISON:  Chest x-ray 07/20/2019. FINDINGS: Right subclavian single-lumen power porta cath with tip terminating at the superior cavoatrial junction. Lung volumes are normal. No consolidative airspace disease. No pleural effusions. No pneumothorax. No pulmonary nodule or mass noted. Pulmonary vasculature and the cardiomediastinal silhouette are within normal limits. IMPRESSION: 1.  No radiographic evidence of acute cardiopulmonary disease. Electronically Signed   By: Vinnie Langton M.D.   On: 10/11/2019 19:42        Scheduled Meds: . gabapentin  900 mg Oral BID  . insulin aspart  0-9 Units Subcutaneous TID WC  . megestrol  400 mg Oral BID  . mirtazapine  15 mg Oral QHS  . oxyCODONE  40 mg Oral Q12H  . pantoprazole  40 mg Oral QPM   Continuous Infusions: . sodium chloride 100 mL/hr at 10/12/19 1412  . ceFEPime (MAXIPIME) IV 2 g (10/12/19 1657)  . metronidazole 500 mg (10/12/19 1702)  . vancomycin Stopped (10/12/19 1349)      LOS: 1 day    Time spent: 44mins    Kathie Dike, MD [facility]   If 7PM-7AM, please contact night-coverage [email or web address]  10/12/2019, 5:52 PM

## 2019-10-12 NOTE — Progress Notes (Signed)
Pharmacy Antibiotic Note  Charles [name] is a 69 y.o. male admitted on 10/11/2019 with sepsis.  Pharmacy has been consulted for Vancomycin and Cefepime dosing.  Plan: Cefepime 2gm IV q8h Vancomycin 1250 mg IV now then 750 mg IV Q 12 hrs.  Will f/u renal function, micro data, and pt's clinical condition Vanc levels prn   Height: 6\' 2"  (188 cm) Weight: 65.3 kg (144 lb) IBW/kg (Calculated) : 82.2  Temp (24hrs), Avg:100.9 F (38.3 C), Min:98.5 F (36.9 C), Max:103.3 F (39.6 C)  Recent Labs  Lab 10/10/19 0917 10/11/19 1925 10/11/19 1955 10/11/19 2129  WBC 9.7 9.1  --   --   CREATININE 0.62 0.84  --   --   LATICACIDVEN  --   --  2.5* 1.3    Estimated Creatinine Clearance: 76.7 mL/min (by C-G formula based on SCr of 0.84 mg/dL).    Allergies  Allergen Reactions  . Oxaliplatin Other (See Comments)    Chest pain  . Oxycodone     Blisters, hallucinations (takes Percocet at home)  . Tramadol     Blisters, hallucinations  . Trazodone And Nefazodone Other (See Comments)    hallucinations    Antimicrobials this admission: 6/8 Rocephin x1  6/9 Vanc >>  6/9 Cefepime >>   Microbiology results: 6/8 BCx:  6/8 UCx:    Thank you for allowing pharmacy to be a part of this patient's care.  Sherlon Handing, PharmD, BCPS Please see amion for complete clinical pharmacist phone list 10/12/2019 12:03 AM

## 2019-10-12 NOTE — Progress Notes (Signed)
CRITICAL VALUE ALERT  Critical Value:  INR 5.9   Date & Time Notied:  10/12/2019 0807  Provider Notified: Dr. Kathie Dike  Orders Received/Actions taken: awaiting call back/ orders

## 2019-10-12 NOTE — TOC Initial Note (Signed)
Transition of Care [facility]) - Initial/Assessment Note    Patient Details  Name: Charles [name] MRN: [id number] Date of Birth: Jun 07, 1950  Transition of Care [facility]) CM/SW Contact:    Shade Flood, LCSW Phone Number: 10/12/2019, 1:55 PM  Clinical Narrative:                  Pt admitted from home. Pt is high risk for readmission. Met with pt and his wife and dtr at bedside today. Pt reports that he is mostly independent in ADLs at home. He has a walker that he doesn't always need to use. Pt states that he recently started taking an oral chemo medication that made him feel weak and unsteady and he was using his walker for that reason. Per pt, he talked to Dr. Raliegh Ip and has decided that he is not going to take that medication any longer. Pt and family indicate that pt will not pursue any further treatment for his cancer diagnosis.   At this time, pt and family indicate plan is for dc home when stable. At this time, they are not sure if pt will have any new needs for dc. TOC will follow and continue to assess and assist with dc planning.   Expected Discharge Plan: [place] Barriers to Discharge: Continued Medical Work up   Patient Goals and CMS Choice        Expected Discharge Plan and Services Expected Discharge Plan: [place] In-house Referral: Clinical Social Work   Post Acute Care Choice: Resumption of Svcs/PTA Provider Living arrangements for the past 2 months: Single Family Home                                      Prior Living Arrangements/Services Living arrangements for the past 2 months: Single Family Home Lives with:: Spouse Patient language and need for interpreter reviewed:: Yes Do you feel safe going back to the place where you live?: Yes      Need for Family Participation in Patient Care: Yes (Comment) Care giver support system in place?: Yes (comment) Current home services: DME Criminal Activity/Legal Involvement  Pertinent to Current Situation/Hospitalization: No - Comment as needed  Activities of Daily Living Home Assistive Devices/Equipment: Walker (specify type) ADL Screening (condition at time of admission) Patient's cognitive ability adequate to safely complete daily activities?: No Is the patient deaf or have difficulty hearing?: No Does the patient have difficulty seeing, even when wearing glasses/contacts?: No Does the patient have difficulty concentrating, remembering, or making decisions?: No Patient able to express need for assistance with ADLs?: Yes Does the patient have difficulty dressing or bathing?: No Independently performs ADLs?: Yes (appropriate for developmental age) Does the patient have difficulty walking or climbing stairs?: Yes Weakness of Legs: Both Weakness of Arms/Hands: Both  Permission Sought/Granted                  Emotional Assessment Appearance:: Appears stated age Attitude/Demeanor/Rapport: Engaged Affect (typically observed): Pleasant Orientation: : Oriented to Place, Oriented to Self, Oriented to  Time, Oriented to Situation Alcohol / Substance Use: Not Applicable Psych Involvement: No (comment)  Admission diagnosis:  Encephalopathy [G93.40] SIRS (systemic inflammatory response syndrome) ([place]) [R65.10] Sepsis with encephalopathy without septic shock, due to unspecified organism ([place]) [A41.9, R65.20, G93.40] Patient Active Problem List   Diagnosis Date Noted  . Encephalopathy   . SIRS (systemic inflammatory  response syndrome) ([place]) 10/11/2019  . Acute encephalopathy 10/11/2019  . Hyponatremia 10/11/2019  . Coagulopathy ([place]) 10/11/2019  . Hyperbilirubinemia 07/17/2019  . Dilation of biliary tract 07/17/2019  . Elevated LFTs   . Abdominal pain 07/16/2019  . Elevated bilirubin 07/16/2019  . Sepsis ([place]) 02/23/2019  . Nausea, vomiting and diarrhea   . Metastatic colorectal cancer ([place])   . Anastomotic stricture of colorectal region   .  Abnormal CT scan, colon   . Diarrhea in adult patient 09/22/2018  . History of pulmonary embolism 09/22/2018  . Hypokalemia 09/22/2018  . Type 2 diabetes mellitus ([place]) 09/22/2018  . Hypomagnesemia 09/22/2018  . Dilated gallbladder 09/22/2018  . Goals of care, counseling/discussion 09/20/2018  . Fecal incontinence 09/23/2017  . Rectal pain 05/30/2016  . Hiatal hernia   . Schatzki's ring   . GI bleed 11/23/2014  . Gastritis 11/23/2014  . S/P thoracotomy 11/23/2014  . S/P partial lobectomy of lung 11/15/2014  . DVT (deep venous thrombosis) ([place])   . Lung nodule   . Lung nodule, solitary   . Lower abdominal pain 09/08/2014  . Urinary retention 09/08/2014  . Diarrhea 08/01/2014  . 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 Stiffness of joint, not elsewhere classified, ankle and foot 01/31/2014  . Weakness of both legs 01/31/2014  . Hx of radiation therapy   . Dehydration 03/10/2011  . Rectal cancer ([place]) 01/15/2011  . Colon cancer ([place]) 07/24/2010  . GERD 07/11/2010   PCP:  [name], MD Pharmacy:   [facility], [place] [address] [place] [address] Phone: [phone] Fax: [phone]  [place], [place] [address] Phone: [phone] Fax: [phone]     Social Determinants of Health (SDOH) Interventions    Readmission Risk Interventions Readmission Risk Prevention Plan 10/12/2019 02/25/2019 02/24/2019  Transportation Screening Complete Complete Complete  HRI or Home Care Consult - - Not Complete  HRI or Home Care Consult comments - - declined  Social Work Consult for [place] Planning/Counseling - - Complete  Palliative Care Screening - - Not Applicable  Medication Review (RN Care Manager) Complete - Complete  SW Recovery Care/Counseling Consult Complete - -  [place] Not Applicable - -   Some recent data might be hidden

## 2019-10-12 NOTE — Progress Notes (Signed)
HR 124 was up to 138-140. Notified Dr. Myna Hidalgo to make aware, new order for EKG.

## 2019-10-13 DIAGNOSIS — Z515 Encounter for palliative care: Secondary | ICD-10-CM

## 2019-10-13 DIAGNOSIS — Z7189 Other specified counseling: Secondary | ICD-10-CM

## 2019-10-13 DIAGNOSIS — R6510 Systemic inflammatory response syndrome (SIRS) of non-infectious origin without acute organ dysfunction: Secondary | ICD-10-CM

## 2019-10-13 LAB — COMPREHENSIVE METABOLIC PANEL
ALT: 31 U/L (ref 0–44)
AST: 42 U/L — ABNORMAL HIGH (ref 15–41)
Albumin: 2.2 g/dL — ABNORMAL LOW (ref 3.5–5.0)
Alkaline Phosphatase: 92 U/L (ref 38–126)
Anion gap: 9 (ref 5–15)
BUN: 9 mg/dL (ref 8–23)
CO2: 23 mmol/L (ref 22–32)
Calcium: 7.8 mg/dL — ABNORMAL LOW (ref 8.9–10.3)
Chloride: 100 mmol/L (ref 98–111)
Creatinine, Ser: 0.67 mg/dL (ref 0.61–1.24)
GFR calc Af Amer: >60 mL/min (ref >60)
GFR calc non Af Amer: >60 mL/min (ref >60)
Glucose, Bld: 74 mg/dL (ref 70–99)
Potassium: 3.6 mmol/L (ref 3.5–5.1)
Sodium: 132 mmol/L — ABNORMAL LOW (ref 135–145)
Total Bilirubin: 1.2 mg/dL (ref 0.3–1.2)
Total Protein: 5.6 g/dL — ABNORMAL LOW (ref 6.5–8.1)

## 2019-10-13 LAB — URINE CULTURE: Culture: <10000 — AB

## 2019-10-13 LAB — URINALYSIS, ROUTINE W REFLEX MICROSCOPIC
Bilirubin Urine: NEGATIVE
Glucose, UA: NEGATIVE mg/dL
Ketones, ur: NEGATIVE mg/dL
Leukocytes,Ua: NEGATIVE
Nitrite: NEGATIVE
Protein, ur: NEGATIVE mg/dL
Specific Gravity, Urine: 1.018 (ref 1.005–1.030)
pH: 5 (ref 5.0–8.0)

## 2019-10-13 LAB — GLUCOSE, CAPILLARY
Glucose-Capillary: 62 mg/dL — ABNORMAL LOW (ref 70–99)
Glucose-Capillary: 68 mg/dL — ABNORMAL LOW (ref 70–99)
Glucose-Capillary: 119 mg/dL — ABNORMAL HIGH (ref 70–99)
Glucose-Capillary: 134 mg/dL — ABNORMAL HIGH (ref 70–99)
Glucose-Capillary: 134 mg/dL — ABNORMAL HIGH (ref 70–99)
Glucose-Capillary: 126 mg/dL — ABNORMAL HIGH (ref 70–99)

## 2019-10-13 LAB — CBC
HCT: 35.4 % — ABNORMAL LOW (ref 39.0–52.0)
Hemoglobin: 10.8 g/dL — ABNORMAL LOW (ref 13.0–17.0)
MCH: 27.1 pg (ref 26.0–34.0)
MCHC: 30.5 g/dL (ref 30.0–36.0)
MCV: 88.9 fL (ref 80.0–100.0)
Platelets: 164 10*3/uL (ref 150–400)
RBC: 3.98 MIL/uL — ABNORMAL LOW (ref 4.22–5.81)
RDW: 14.6 % (ref 11.5–15.5)
WBC: 9.3 10*3/uL (ref 4.0–10.5)
nRBC: 0 % (ref 0.0–0.2)

## 2019-10-13 LAB — PROTIME-INR
INR: 2.4 — ABNORMAL HIGH (ref 0.8–1.2)
Prothrombin Time: 25.1 seconds — ABNORMAL HIGH (ref 11.4–15.2)

## 2019-10-13 LAB — MAGNESIUM: Magnesium: 1.6 mg/dL — ABNORMAL LOW (ref 1.7–2.4)

## 2019-10-13 MED ORDER — METOPROLOL TARTRATE 5 MG/5ML IV SOLN: 5.0000 mg | Freq: Once | INTRAVENOUS | Status: DC

## 2019-10-13 MED ORDER — ENSURE ENLIVE PO LIQD
237.0000 mL | Freq: Two times a day (BID) | ORAL | Status: DC
  Administered 2019-10-13 – 2019-10-14 (×4): 237 mL via ORAL

## 2019-10-13 MED ORDER — SODIUM CHLORIDE 0.9 % IV BOLUS
1000.0000 mL | Freq: Once | INTRAVENOUS | Status: AC
  Administered 2019-10-13: 1000 mL via INTRAVENOUS

## 2019-10-13 MED ORDER — PANTOPRAZOLE SODIUM 40 MG PO TBEC
40.0000 mg | DELAYED_RELEASE_TABLET | Freq: Two times a day (BID) | ORAL | Status: DC
  Administered 2019-10-14: 40 mg via ORAL
  Filled 2019-10-13: qty 1

## 2019-10-13 MED ORDER — POTASSIUM CHLORIDE 10 MEQ/100ML IV SOLN
10.0000 meq | INTRAVENOUS | Status: AC
  Administered 2019-10-13 (×4): 10 meq via INTRAVENOUS
  Filled 2019-10-13 (×4): qty 100

## 2019-10-13 MED ORDER — METOPROLOL TARTRATE 5 MG/5ML IV SOLN
5.0000 mg | INTRAVENOUS | Status: AC | PRN
  Administered 2019-10-13 (×2): 5 mg via INTRAVENOUS
  Filled 2019-10-13 (×2): qty 5

## 2019-10-13 MED ORDER — RIVAROXABAN 20 MG PO TABS
20.0000 mg | ORAL_TABLET | Freq: Every day | ORAL | Status: DC
  Administered 2019-10-13: 20 mg via ORAL
  Filled 2019-10-13: qty 1

## 2019-10-13 MED ORDER — MAGNESIUM SULFATE 2 GM/50ML IV SOLN
2.0000 g | Freq: Once | INTRAVENOUS | Status: AC
  Administered 2019-10-13: 2 g via INTRAVENOUS
  Filled 2019-10-13: qty 50

## 2019-10-13 MED ORDER — METOPROLOL TARTRATE 5 MG/5ML IV SOLN
2.5000 mg | INTRAVENOUS | Status: AC | PRN
  Administered 2019-10-13 (×2): 2.5 mg via INTRAVENOUS
  Filled 2019-10-13: qty 5

## 2019-10-13 NOTE — Progress Notes (Signed)
Initial Nutrition Assessment  DOCUMENTATION CODES:   Not applicable  INTERVENTION:  Ensure Enlive po BID, each supplement provides 350 kcal and 20 grams of protein  Liberalize diet  Monitor magnesium, potassium, and phosphorus daily for at least 3 days, MD to replete as needed, as pt is at risk for refeeding syndrome given resolved hypokalemia s/p repletion, hypomagnesemia per labs.  NUTRITION DIAGNOSIS:   Inadequate oral intake related to cancer and cancer related treatments (metastatic colorectal cancer s/p resection, radiation, and chemotherapy) as evidenced by percent weight loss (per flowsheets, 50% po intake of meals).  GOAL:   Patient will meet greater than or equal to 90% of their needs  MONITOR:   Labs, I & O's, Supplement acceptance, PO intake, Weight trends  REASON FOR ASSESSMENT:   Malnutrition Screening Tool    ASSESSMENT:  RD working remotely.  69 year old male admitted for SIRS after presenting with increased fatigue, malaise, and confusion. Past medical history significant for colorectal cancer with metastases to pancreas, liver, and lungs, recently started on [place], DM2, history of PE, GERDand chronic pain related to cancer.  Per notes, patient is s/p resection, radiation, and chemotherapy. He was recently started on an oral chemotherapy which he did not tolerate and stopped taking a few days ago. Patient and family indicated that patient will not pursue andy further treatment for his cancer diagnosis, Palliative care consulted for goals of care.  Attempted to contact patient via phone this morning, however pt did not pick up. Unable to obtain nutrition history at this time. Patient is followed by outpatient RD at cancer center, last encounter on 09/16/19. Per notes, he reports improved appetite since starting Megace and trying to drink 5-6 ensure original shakes daily. Per flowsheets, he has eaten 50% x 3 documented meals. Current HH/CM diet order restricts  protein and limits patient's meal options, consider liberalizing the heart healthy portion. Pt with poor oral intake and would benefit from nutrient dense supplement. One Ensure Enlive supplement provides 350 kcals, 20 grams protein, and 44-45 grams of carbohydrate vs one Glucerna shake supplement, which provides 220 kcals, 10 grams of protein, and 26 grams of carbohydrate. Given pt's hx of DM, RD will reassess adequacy of PO intake, CBGS, and adjust supplement regimen as appropriate.   Current wt 148.94 lb Weight history reviewed, on 07/27/19 pt weighed 162.58 lb, on 08/15/19 pt weighed 154.66 lb, on 09/05/19 he weighed 151.36 lb, and on 09/27/19 pt weighed 149.38 lb. This indicates a 13.64 lb (8.4%) wt loss in the past 10 weeks which is significant. Given recent weight trends and metastatic colorectal cancer, highly suspect malnutrition however unable to identify at this time.   Medications reviewed and include: Gabapentin, SSI, Megace, Remeron, Protonix IVF: NaCl IVPB: Flagyl, Vancomycin, KCl 10 mEq Labs: CBGs [phone] Na 132 (L), Mg 1.6 (L)  NUTRITION - FOCUSED PHYSICAL EXAM: Unable to complete at this time, RD working remotely.   Diet Order:   Diet Order            Diet heart healthy/carb modified Room service appropriate? Yes; Fluid consistency: Thin  Diet effective now                 EDUCATION NEEDS:   No education needs have been identified at this time  Skin:  Skin Assessment: Reviewed RN Assessment  Last BM:  6/9 (100 ml, ostomy)  Height:   Ht Readings from Last 1 Encounters:  10/12/19 6\' 2"  (1.88 m)    Weight:  Wt Readings from Last 1 Encounters:  10/12/19 67.7 kg    BMI:  Body mass index is 19.16 kg/m.  Estimated Nutritional Needs:   Kcal:  4604-7998  Protein:  102-112  Fluid:  >/= 2.1 L/day   Lajuan Lines, RD, LDN Clinical Nutrition After Hours/Weekend Pager # in [place]

## 2019-10-13 NOTE — Consult Note (Signed)
Consultation Note Date: 10/13/2019   Patient Name: Charles [name]  DOB: 04/25/1951  MRN: [id number]  Age / Sex: 69 y.o., male  PCP: [name], MD Referring Physician: Kathie Dike, MD  Reason for Consultation: Establishing goals of care  HPI/Patient Profile: 68 y.o. male  with past medical history of colorectal cancer with mets to pancreas/liver/lungs, diabetes, history of PE on Xarelto, chronic cancer related pain admitted on 10/11/2019 with malaise, confusion, fatigue. Recently began [place] with persistent nausea/vomiting treated with IV fluids and scopolamine patch 10/10/19. Admitted with SIRS with no infection etiology identified.   Clinical Assessment and Goals of Care: I met today with Mr. Dosanjh along with his wife and daughter at bedside after discussing case with Dr. Roderic Palau. They share that he worked for [employer], owned a Set designer, and ran a farm. He still has some goats and sheep that he enjoys caring for and being around. He and his wife have been together since ages 32 and 69 yo respectively. They have 1 daughter and 1 son and multiple grandchildren all live close by.   Mr. Rampey has been receiving treatment for his cancer for the past 3 years. He has ongoing nausea/vomiting with intake that daughter reports seems to be worse with textures as he tolerates ice cream, Ensure better. Discussed use of ground meats with more gravy/sauces to try and help with texture and intake tolerance at home. He complains of waking up with dry, bad tasting mouth which is likely side effect from scopolomine patch.   We further discussed where to go from here. We discussed expectations as there are no further options to treat his cancer. Goal is to return home and spend as much time with his family as possible. We discussed help from hospice at home and discussed the details of the benefits of hospice.  Although they are extremely tearful they agree that this will be the best option for them at this stage. They will need hospital bed and would like some physical therapy visits if possible. We also discussed code status and they all agree that DNR would be most appropriate as they do not want him to suffer.   Must be noted that Mr. Weekes was present for conversation but was falling asleep and awakening confused and did not understand the conversation at hand. He was not really able to participate.   All questions/concerns addressed. Emotional support provided. Discussed plan with Dr. Roderic Palau.   Primary Decision Maker NEXT OF KIN wife and daughter at bedside    SUMMARY OF RECOMMENDATIONS   - DNR decided - Home with hospice when medically optimized   Code Status/Advance Care Planning:  DNR   Symptom Management:   Nausea/vomiting: texture related? Diet changed to Dys 3. Scopolamine patch in place. Continue Ensure/supplements.   Indigestion: Protonix increased to twice daily.   Palliative Prophylaxis:   Bowel Regimen, Delirium Protocol and Frequent Pain Assessment  Psycho-social/Spiritual:   Desire for further Chaplaincy support:yes  Additional Recommendations: Education on Hospice and Grief/Bereavement Support  Prognosis:   Overall prognosis poor with metastatic cancer and no further treatment options.   Discharge Planning: Home with Hospice      Primary Diagnoses: Present on Admission: . SIRS (systemic inflammatory response syndrome) (HCC) . History of pulmonary embolism . Metastatic colorectal cancer ([place]) . Acute encephalopathy . Hyponatremia . Coagulopathy ([place])   I have reviewed the medical record, interviewed the patient and family, and examined the patient. The following aspects are pertinent.  Past Medical History:  Diagnosis Date  . Chronic anticoagulation   . Colon cancer ([place]) 07/24/2010   rectal ca, inv adenocarcinoma  . Depression 04/01/2011  .  Diabetes mellitus without complication ([place])   . DVT (deep venous thrombosis) ([place]) 05/09/2011  . GERD (gastroesophageal reflux disease)   . High output ileostomy ([place]) 05/21/2011  . History of kidney stones   . HTN (hypertension)   . Hx of radiation therapy 09/02/10 to 10/14/10   pelvis  . Lung metastasis ([place])   . Neuropathy   . Peripheral vascular disease ([place])    dvt's,pe  . Pneumonia    hx x3  . Pulmonary embolism ([place])   . Rectal cancer ([place]) 01/15/2011   S/P radiation and concurrent 5-FU continuous infusion from 09/09/10- 10/10/10.  S/P proctectomy with colorectal anastomosis and diverting loop ileostomy on 11/14/10 at [facility] by Dr. Harlon Ditty. Pathology reveals a pT3b N1 with 3/20 lymph nodes.     Social History   Socioeconomic History  . Marital status: Married    Spouse name: Not on file  . Number of children: 2  . Years of education: Not on file  . Highest education level: Not on file  Occupational History  . Occupation: self-employed Regulatory affairs officer, currently not working    Fish farm manager: SELF EMPLOYED  Tobacco Use  . Smoking status: Never Smoker  . Smokeless tobacco: Never Used  Vaping Use  . Vaping Use: Never used  Substance and Sexual Activity  . Alcohol use: No  . Drug use: No  . Sexual activity: Yes    Birth control/protection: None    Comment: married  Other Topics Concern  . Not on file  Social History Narrative  . Not on file   Social Determinants of Health   Financial Resource Strain: Low Risk   . Difficulty of Paying Living Expenses: Not very hard  Food Insecurity: No Food Insecurity  . Worried About Charity fundraiser in the Last Year: Never true  . Ran Out of Food in the Last Year: Never true  Transportation Needs: No Transportation Needs  . Lack of Transportation (Medical): No  . Lack of Transportation (Non-Medical): No  Physical Activity: Inactive  . Days of Exercise per Week: 0 days  . Minutes of Exercise per Session: 0 min    Stress: No Stress Concern Present  . Feeling of Stress : Only a little  Social Connections: Unknown  . Frequency of Communication with Friends and Family: More than three times a week  . Frequency of Social Gatherings with Friends and Family: Once a week  . Attends Religious Services: Patient refused  . Active Member of Clubs or Organizations: Patient refused  . Attends Archivist Meetings: Patient refused  . Marital Status: Patient refused   Family History  Problem Relation Age of Onset  . Cancer Brother        throat  . Cancer Brother        prostate  . Colon cancer Neg Hx   . Liver  disease Neg Hx   . Inflammatory bowel disease Neg Hx    Scheduled Meds: . gabapentin  900 mg Oral BID  . insulin aspart  0-9 Units Subcutaneous TID WC  . megestrol  400 mg Oral BID  . mirtazapine  15 mg Oral QHS  . pantoprazole  40 mg Oral QPM   Continuous Infusions: . sodium chloride 100 mL/hr at 10/12/19 1412  . ceFEPime (MAXIPIME) IV 2 g (10/13/19 0846)  . magnesium sulfate bolus IVPB 2 g (10/13/19 0930)  . metronidazole 500 mg (10/13/19 0935)  . potassium chloride 10 mEq (10/13/19 0932)  . vancomycin 750 mg (10/12/19 2241)   PRN Meds:.acetaminophen **OR** acetaminophen, clonazePAM, metoprolol tartrate, ondansetron **OR** ondansetron (ZOFRAN) IV, oxyCODONE Allergies  Allergen Reactions  . Oxaliplatin Other (See Comments)    Chest pain  . Oxycodone     Blisters, hallucinations (takes Percocet at home)  . Tramadol     Blisters, hallucinations  . Trazodone And Nefazodone Other (See Comments)    hallucinations   Review of Systems  Constitutional: Positive for activity change, appetite change and fatigue.  Gastrointestinal: Positive for nausea.  Neurological: Positive for weakness.    Physical Exam Vitals and nursing note reviewed.  Constitutional:      General: He is not in acute distress.    Appearance: He is ill-appearing.     Comments: Sitting up in recliner   Cardiovascular:     Rate and Rhythm: Normal rate.  Pulmonary:     Effort: Pulmonary effort is normal. No tachypnea, accessory muscle usage or respiratory distress.  Abdominal:     General: Abdomen is flat.  Neurological:     Comments: Appeared alert and oriented at beginning of conversation but then would fall asleep and confused when awakened     Vital Signs: BP (!) 119/58 (BP Location: Left Arm)   Pulse 94   Temp 97.9 F (36.6 C) (Oral)   Resp 16   Ht '6\' 2"'  (1.88 m)   Wt 67.7 kg   SpO2 99%   BMI 19.16 kg/m  Pain Scale: 0-10   Pain Score: 5    SpO2: SpO2: 99 % O2 Device:SpO2: 99 % O2 Flow Rate: .   IO: Intake/output summary:   Intake/Output Summary (Last 24 hours) at 10/13/2019 1009 Last data filed at 10/13/2019 0602 Gross per 24 hour  Intake 1730.69 ml  Output 500 ml  Net 1230.69 ml    LBM: Last BM Date: 10/12/19 Baseline Weight: Weight: 65.3 kg Most recent weight: Weight: 67.7 kg     Palliative Assessment/Data:     Time In: 1045 Time Out: 1155 Time Total: 70 min Greater than 50%  of this time was spent counseling and coordinating care related to the above assessment and plan.  Signed by: Vinie Sill, NP Palliative Medicine Team Pager # [phone] (M-F 8a-5p) Team Phone # [phone] (Nights/Weekends)

## 2019-10-13 NOTE — Evaluation (Signed)
Physical Therapy Evaluation Patient Details Name: Charles [name] MRN: [id number] DOB: 1950/09/10 Today's Date: 10/13/2019   History of Present Illness  Charles [name] is a 69 y.o. male with medical history significant for colorectal cancer with metastases to pancreas, liver, and lungs, type 2 diabetes mellitus, history of PE now on Xarelto, and chronic pain related to his cancer, now presenting to the emergency department for evaluation of malaise, fatigue, and confusion.  Patient was recently started on [place] by his oncologist, developed persistent nausea and vomiting, was treated with IV fluids and scopolamine patch at the cancer center on 10/10/2019, but then this morning per report of his wife, he had increased fatigue and was confused.  Patient is normally fully oriented and able to take care of his own ADLs, but was disoriented this morning and requiring assistance with dressing and eating.  Patient complained of general malaise but nothing else.  By time of admission, the patient is oriented and able to provide a history,  Clinical Impression  Pt states that he normally ambulates without an assistive device at home but does have a walker.  PT initially unsteady but improved stability with ambulation.  Pt has decreased activity tolerance and balance and will benefit from home health physical therapy     Follow Up Recommendations Home health PT    Equipment Recommendations  None recommended by PT    Recommendations for Other Services   none    Precautions / Restrictions Restrictions Weight Bearing Restrictions: No      Mobility  Bed Mobility Overal bed mobility: Needs Assistance                Transfers Overall transfer level: Needs assistance Equipment used: Rolling walker (2 wheeled) Transfers: Sit to/from Stand Sit to Stand: Supervision            Ambulation/Gait Ambulation/Gait assistance: Supervision Gait Distance (Feet): 65 Feet Assistive device:  Rolling walker (2 wheeled) Gait Pattern/deviations: WFL(Within Functional Limits)   Gait velocity interpretation: <1.8 ft/sec, indicate of risk for recurrent falls             Pertinent Vitals/Pain Pain Assessment: 0-10 Pain Score: 4  Pain Location: Rt abdominal chronic since surgery last year Pain Descriptors / Indicators: Aching Pain Intervention(s): Limited activity within patient's tolerance    Home Living Family/patient expects to be discharged to:: Private residence     Type of Home: House Home Access: Stairs to enter Entrance Stairs-Rails: Can reach both Entrance Stairs-Number of Steps: 5 Home Layout: One level Home Equipment: Environmental consultant - 2 wheels;Cane - single point;Grab bars - tub/shower      Prior Function Level of Independence: Independent                  Extremity/Trunk Assessment   Upper Extremity Assessment Upper Extremity Assessment: Defer to OT evaluation    Lower Extremity Assessment Lower Extremity Assessment: Overall WFL for tasks assessed       Communication    normal   Cognition Arousal/Alertness: Awake/alert Behavior During Therapy: WFL for tasks assessed/performed Overall Cognitive Status: Within Functional Limits for tasks assessed                                               Assessment/Plan    PT Assessment All further PT needs can be met in the next venue of care  PT Problem List Decreased strength;Decreased activity tolerance;Decreased balance       PT Treatment Interventions      PT Goals (Current goals can be found in the Care Plan section)  Acute Rehab PT Goals Patient Stated Goal: to go home PT Goal Formulation: With patient/family Potential to Achieve Goals: Good               AM-PAC PT "6 Clicks" Mobility  Outcome Measure Help needed turning from your back to your side while in a flat bed without using bedrails?: A Little Help needed moving from lying on your back to sitting on the  side of a flat bed without using bedrails?: A Little Help needed moving to and from a bed to a chair (including a wheelchair)?: A Little Help needed standing up from a chair using your arms (e.g., wheelchair or bedside chair)?: A Little Help needed to walk in hospital room?: A Little Help needed climbing 3-5 steps with a railing? : A Lot 6 Click Score: 17    End of Session Equipment Utilized During Treatment: Gait belt Activity Tolerance: Patient tolerated treatment well Patient left: in chair;with call bell/phone within reach;with family/visitor present Nurse Communication: Mobility status PT Visit Diagnosis: Muscle weakness (generalized) (M62.81);Other abnormalities of gait and mobility (R26.89)    Time: 1020-1055 PT Time Calculation (min) (ACUTE ONLY): 35 min   Charges:   PT Evaluation $PT Eval Low Complexity: [place], PT CLT [phone] 10/13/2019, 10:55 AM

## 2019-10-13 NOTE — Progress Notes (Signed)
PROGRESS NOTE    Charles [name]  EXN:[id number] DOB: 07/29/1950 DOA: 10/11/2019 PCP: [name], MD    Brief Narrative:  HPI: Charles [name] is a 69 y.o. male with medical history significant for colorectal cancer with metastases to pancreas, liver, and lungs, type 2 diabetes mellitus, history of PE now on Xarelto, and chronic pain related to his cancer, now presenting to the emergency department for evaluation of malaise, fatigue, and confusion.  Patient was recently started on Young by his oncologist, developed persistent nausea and vomiting, was treated with IV fluids and scopolamine patch at the cancer center on 10/10/2019, but then this morning per report of his wife, he had increased fatigue and was confused.  Patient is normally fully oriented and able to take care of his own ADLs, but was disoriented this morning and requiring assistance with dressing and eating.  Patient complained of general malaise but nothing else.  By time of admission, the patient is oriented and able to provide a history, reporting that his chronic right-sided abdominal pain is unchanged, he does not have any cough or shortness of breath, and he has not noticed any change in his stool.  He denies headache, chest pain, or palpitations   Assessment & Plan:   Principal Problem:   SIRS (systemic inflammatory response syndrome) (HCC) Active Problems:   History of pulmonary embolism   Type 2 diabetes mellitus ([place])   Metastatic colorectal cancer ([place])   Acute encephalopathy   Hyponatremia   Coagulopathy ([place])   1. SIRS.  Currently, does not appear to be in obvious source of sepsis at this time.  He was febrile on admission which has since improved.  We will continue intravenous antibiotics for now until culture results are found to be negative.  Patient is at high risk for bacteremia due to presence of Port-A-Cath.  Thus far, blood cultures are showing no growth.  Urine culture is also been  negative. 2. Metastatic rectal cancer.  Status post resection, radiation and chemotherapy.  He has known metastases to pancreas, liver and lung.  He is no longer on IV chemotherapy.  Recently started on oral chemotherapy which she did not tolerate and stopped taking few days ago.  He decided not to continue with further chemotherapy.  Palliative care consulted to help address further goals of care. 3. Acute encephalopathy.  Suspect this related to his fever and acute illness.  Newly prescribed OxyContin may also be playing a role.  Mental status is not back to baseline yet.  We will hold further OxyContin for now. 4. History of PE, chronically on Xarelto.  INR is supratherapeutic. 5. Diabetes.  Continues to have episodes of hypoglycemia this morning.  This response to glucose/dextrose.  Continue to monitor on sliding scale insulin. 6. Chronic pain.  He is chronically on oxycodone.  He was recently started on OxyContin.  Will hold OxyContin in light of confusion.  Continue on oxycodone as needed. 7. Goals of care.  Seen by palliative care and patient made DNR.  Patient/family are electing to discharge home with hospice services when he is stable.   DVT prophylaxis: INR supratherapeutic.  Chronically on Xarelto which is currently on hold. Code Status: DNR Family Communication: Discussed with wife and daughter at the bedside Disposition Plan: Status is: Inpatient  Remains inpatient appropriate because:Ongoing diagnostic testing needed not appropriate for outpatient work up   Dispo: The patient is from: Home  Anticipated d/c is to: Home              Anticipated d/c date is: 1 day              Patient currently is not medically stable to d/c.    Consultants:   Palliative care  Procedures:     Antimicrobials:   Vancomycin 6/8 >  Cefepime 6/8 >  Flagyl 6/8 >   Subjective: Family feels that patient continues to be confused and mental status is not back to baseline.  He  did have an episode of tachycardia overnight and required IV metoprolol as well as a fluid bolus.  He has not had any vomiting.  Feels that pain is reasonably controlled.  Objective: Vitals:   10/13/19 0408 10/13/19 0528 10/13/19 1349 10/13/19 1525  BP: (!) 110/48 (!) 119/58  105/60  Pulse: 94  (!) 108 97  Resp:   18 18  Temp:    97.8 F (36.6 C)  TempSrc:    Oral  SpO2:   96% 96%  Weight:      Height:        Intake/Output Summary (Last 24 hours) at 10/13/2019 1906 Last data filed at 10/13/2019 1628 Gross per 24 hour  Intake 2225.5 ml  Output 500 ml  Net 1725.5 ml   Filed Weights   10/11/19 1844 10/12/19 0050  Weight: 65.3 kg 67.7 kg    Examination:  General exam: Alert, awake, oriented x 3 Respiratory system: Clear to auscultation. Respiratory effort normal. Cardiovascular system:RRR. No murmurs, rubs, gallops. Gastrointestinal system: Abdomen is nondistended, soft and nontender. No organomegaly or masses felt. Normal bowel sounds heard.  Ostomy bag in left lower quadrant full with brown-colored stool Central nervous system: Alert and oriented. No focal neurological deficits. Extremities: No C/C/E, +pedal pulses Skin: No rashes, lesions or ulcers Psychiatry: Judgement and insight appear normal. Mood & affect appropriate.    Data Reviewed: I have personally reviewed following labs and imaging studies  CBC: Recent Labs  Lab 10/10/19 0917 10/11/19 1925 10/12/19 0545 10/13/19 0527  WBC 9.7 9.1 12.6* 9.3  NEUTROABS 7.8* 7.4  --   --   HGB 11.6* 10.8* 10.1* 10.8*  HCT 35.9* 33.6* 32.3* 35.4*  MCV 85.7 86.8 87.1 88.9  PLT 187 188 166 144   Basic Metabolic Panel: Recent Labs  Lab 10/10/19 0917 10/11/19 1925 10/12/19 0545 10/13/19 0527  NA 132* 132* 133* 132*  K 3.9 4.1 3.4* 3.6  CL 96* 94* 99 100  CO2 25 27 25 23   GLUCOSE 155* 112* 55* 74  BUN 11 14 11 9   CREATININE 0.62 0.84 0.66 0.67  CALCIUM 8.4* 8.2* 7.7* 7.8*  MG 1.7  --   --  1.6*    GFR: Estimated Creatinine Clearance: 83.4 mL/min (by C-G formula based on SCr of 0.67 mg/dL). Liver Function Tests: Recent Labs  Lab 10/10/19 0917 10/11/19 1925 10/12/19 0545 10/13/19 0527  AST 31 32 32 42*  ALT 25 27 25 31   ALKPHOS 124 113 88 92  BILITOT 2.6* 1.4* 1.5* 1.2  PROT 6.5 6.5 5.5* 5.6*  ALBUMIN 2.8* 2.7* 2.3* 2.2*   No results for input(s): LIPASE, AMYLASE in the last 168 hours. Recent Labs  Lab 10/12/19 0011  AMMONIA 22   Coagulation Profile: Recent Labs  Lab 10/11/19 1925 10/12/19 0545 10/13/19 0527  INR 6.0* 5.9* 2.4*   Cardiac Enzymes: No results for input(s): CKTOTAL, CKMB, CKMBINDEX, TROPONINI in the last 168 hours. BNP (last 3  results) No results for input(s): PROBNP in the last 8760 hours. HbA1C: No results for input(s): HGBA1C in the last 72 hours. CBG: Recent Labs  Lab 10/13/19 0810 10/13/19 0852 10/13/19 0939 10/13/19 1136 10/13/19 1556  GLUCAP 62* 68* 119* 134* 134*   Lipid Profile: No results for input(s): CHOL, HDL, LDLCALC, TRIG, CHOLHDL, LDLDIRECT in the last 72 hours. Thyroid Function Tests: No results for input(s): TSH, T4TOTAL, FREET4, T3FREE, THYROIDAB in the last 72 hours. Anemia Panel: No results for input(s): VITAMINB12, FOLATE, FERRITIN, TIBC, IRON, RETICCTPCT in the last 72 hours. Sepsis Labs: Recent Labs  Lab 10/11/19 1955 10/11/19 2129  LATICACIDVEN 2.5* 1.3    Recent Results (from the past 240 hour(s))  Blood Culture (routine x 2)     Status: None (Preliminary result)   Collection Time: 10/11/19  7:25 PM   Specimen: BLOOD  Result Value Ref Range Status   Specimen Description BLOOD RIGHT ANTECUBITAL  Final   Special Requests   Final    BOTTLES DRAWN AEROBIC AND ANAEROBIC Blood Culture adequate volume   Culture   Final    NO GROWTH 2 DAYS Performed at [facility], [address] [place], [place] [address]    Report Status PENDING  Incomplete  Urine culture     Status: Abnormal   Collection Time:  10/11/19  7:25 PM   Specimen: In/Out Cath Urine  Result Value Ref Range Status   Specimen Description   Final    IN/OUT CATH URINE Performed at [facility], [address], [place], [place] [address]    Special Requests   Final    NONE Performed at [facility], [address], [place], [place] [address]    Culture (A)  Final    <10,000 COLONIES/mL INSIGNIFICANT GROWTH Performed at [facility], [place] [address] [place], [place] [address]    Report Status 10/13/2019 FINAL  Final  SARS Coronavirus 2 by RT PCR (hospital order, performed in [facility] hospital lab) Nasopharyngeal Nasopharyngeal Swab     Status: None   Collection Time: 10/11/19  7:26 PM   Specimen: Nasopharyngeal Swab  Result Value Ref Range Status   SARS Coronavirus 2 NEGATIVE NEGATIVE Final    Comment: (NOTE) SARS-CoV-2 target nucleic acids are NOT DETECTED. The SARS-CoV-2 RNA is generally detectable in upper and lower respiratory specimens during the acute phase of infection. The lowest concentration of SARS-CoV-2 viral copies this assay can detect is 250 copies / mL. A negative result does not preclude SARS-CoV-2 infection and should not be used as the sole basis for treatment or other patient management decisions.  A negative result may occur with improper specimen collection / handling, submission of specimen other than nasopharyngeal swab, presence of viral mutation(s) within the areas targeted by this assay, and inadequate number of viral copies (<250 copies / mL). A negative result must be combined with clinical observations, patient history, and epidemiological information. Fact Sheet for Patients:   StrictlyIdeas.no Fact Sheet for Healthcare Providers: BankingDealers.co.za This test is not yet approved or cleared  by the [place] FDA and has been authorized for detection and/or diagnosis of SARS-CoV-2 by FDA under an Emergency Use Authorization  (EUA).  This EUA will remain in effect (meaning this test can be used) for the duration of the COVID-19 declaration under Section 564(b)(1) of the Act, 21 U.S.C. section 360bbb-3(b)(1), unless the authorization is terminated or revoked sooner. Performed at [facility], [address], [place], [place] [address]   Blood Culture (  routine x 2)     Status: None (Preliminary result)   Collection Time: 10/11/19  7:55 PM   Specimen: BLOOD LEFT HAND  Result Value Ref Range Status   Specimen Description BLOOD LEFT HAND  Final   Special Requests   Final    BOTTLES DRAWN AEROBIC ONLY Blood Culture adequate volume   Culture   Final    NO GROWTH 2 DAYS Performed at [facility], [address], [place], [place] [address]    Report Status PENDING  Incomplete         Radiology Studies: CT Head Wo Contrast  Result Date: 10/11/2019 CLINICAL DATA:  69 year old male with history of generalize weakness. In cephalopathy. EXAM: CT HEAD WITHOUT CONTRAST TECHNIQUE: Contiguous axial images were obtained from the base of the skull through the vertex without intravenous contrast. COMPARISON:  Head CT 06/17/2016. FINDINGS: Brain: Mild cerebral atrophy patchy and confluent areas of decreased attenuation are noted throughout the deep and periventricular white matter of the cerebral hemispheres bilaterally, compatible with chronic microvascular ischemic disease. No evidence of acute infarction, hemorrhage, hydrocephalus, extra-axial collection or mass lesion/mass effect. Vascular: No hyperdense vessel or unexpected calcification. Skull: Normal. Negative for fracture or focal lesion. Sinuses/Orbits: No acute finding. Other: None. IMPRESSION: 1. No acute intracranial abnormalities. 2. Mild cerebral atrophy with chronic microvascular ischemic changes in the cerebral white matter, as above. Electronically Signed   By: Vinnie Langton M.D.   On: 10/11/2019 20:19   DG Chest Port 1 View  Result Date: 10/11/2019 CLINICAL  DATA:  69 year old male with history of fever. Generalized weakness. EXAM: PORTABLE CHEST 1 VIEW COMPARISON:  Chest x-ray 07/20/2019. FINDINGS: Right subclavian single-lumen power porta cath with tip terminating at the superior cavoatrial junction. Lung volumes are normal. No consolidative airspace disease. No pleural effusions. No pneumothorax. No pulmonary nodule or mass noted. Pulmonary vasculature and the cardiomediastinal silhouette are within normal limits. IMPRESSION: 1.  No radiographic evidence of acute cardiopulmonary disease. Electronically Signed   By: Vinnie Langton M.D.   On: 10/11/2019 19:42        Scheduled Meds: . feeding supplement (ENSURE ENLIVE)  237 mL Oral BID BM  . gabapentin  900 mg Oral BID  . insulin aspart  0-9 Units Subcutaneous TID WC  . megestrol  400 mg Oral BID  . mirtazapine  15 mg Oral QHS  . pantoprazole  40 mg Oral QPM   Continuous Infusions: . ceFEPime (MAXIPIME) IV 2 g (10/13/19 1640)  . metronidazole 500 mg (10/13/19 1734)  . vancomycin Stopped (10/13/19 1306)     LOS: 2 days    Time spent: 95mins    Kathie Dike, MD [facility]   If 7PM-7AM, please contact night-coverage [email or web address]  10/13/2019, 7:06 PM

## 2019-10-13 NOTE — Progress Notes (Signed)
[facility] night shift.  The staff has reported that the patient's heart rate has being in the 120s to 140s.  He has partially responded to metoprolol 2.5 mg IV x2.  I will add an NS bolus of 1000 mL and a second dose of metoprolol 5 mg IVP.  Monitor blood pressure and heart rate.  Tennis Must, MD

## 2019-10-13 NOTE — Progress Notes (Signed)
Pt was on xarelto prior to admission for a hx of PE. It was held due to elevated INR. Rivaroxaban can cause that but it's not a direct correlation to the effect. It was ordered to be resumed tonight.   Resume Xarelto 20mg  PO qday with supper   Onnie Boer, PharmD, [place], AAHIVP, CPP Infectious Disease Pharmacist 10/13/2019 7:19 PM

## 2019-10-14 ENCOUNTER — Encounter (HOSPITAL_COMMUNITY): Payer: Medicare Other

## 2019-10-14 LAB — COMPREHENSIVE METABOLIC PANEL
ALT: 30 U/L (ref 0–44)
AST: 42 U/L — ABNORMAL HIGH (ref 15–41)
Albumin: 2 g/dL — ABNORMAL LOW (ref 3.5–5.0)
Alkaline Phosphatase: 82 U/L (ref 38–126)
Anion gap: 8 (ref 5–15)
BUN: 9 mg/dL (ref 8–23)
CO2: 23 mmol/L (ref 22–32)
Calcium: 7.9 mg/dL — ABNORMAL LOW (ref 8.9–10.3)
Chloride: 101 mmol/L (ref 98–111)
Creatinine, Ser: 0.6 mg/dL — ABNORMAL LOW (ref 0.61–1.24)
GFR calc Af Amer: >60 mL/min (ref >60)
GFR calc non Af Amer: >60 mL/min (ref >60)
Glucose, Bld: 114 mg/dL — ABNORMAL HIGH (ref 70–99)
Potassium: 3.9 mmol/L (ref 3.5–5.1)
Sodium: 132 mmol/L — ABNORMAL LOW (ref 135–145)
Total Bilirubin: 0.8 mg/dL (ref 0.3–1.2)
Total Protein: 5 g/dL — ABNORMAL LOW (ref 6.5–8.1)

## 2019-10-14 LAB — CBC
HCT: 33 % — ABNORMAL LOW (ref 39.0–52.0)
Hemoglobin: 10.4 g/dL — ABNORMAL LOW (ref 13.0–17.0)
MCH: 27.1 pg (ref 26.0–34.0)
MCHC: 31.5 g/dL (ref 30.0–36.0)
MCV: 85.9 fL (ref 80.0–100.0)
Platelets: 163 10*3/uL (ref 150–400)
RBC: 3.84 MIL/uL — ABNORMAL LOW (ref 4.22–5.81)
RDW: 14.8 % (ref 11.5–15.5)
WBC: 8.4 10*3/uL (ref 4.0–10.5)
nRBC: 0 % (ref 0.0–0.2)

## 2019-10-14 LAB — RENAL FUNCTION PANEL
Albumin: 2.1 g/dL — ABNORMAL LOW (ref 3.5–5.0)
Anion gap: 9 (ref 5–15)
BUN: 9 mg/dL (ref 8–23)
CO2: 22 mmol/L (ref 22–32)
Calcium: 7.8 mg/dL — ABNORMAL LOW (ref 8.9–10.3)
Chloride: 100 mmol/L (ref 98–111)
Creatinine, Ser: 0.62 mg/dL (ref 0.61–1.24)
GFR calc Af Amer: >60 mL/min (ref >60)
GFR calc non Af Amer: >60 mL/min (ref >60)
Glucose, Bld: 109 mg/dL — ABNORMAL HIGH (ref 70–99)
Phosphorus: 1.7 mg/dL — ABNORMAL LOW (ref 2.5–4.6)
Potassium: 4 mmol/L (ref 3.5–5.1)
Sodium: 131 mmol/L — ABNORMAL LOW (ref 135–145)

## 2019-10-14 LAB — GLUCOSE, CAPILLARY
Glucose-Capillary: 117 mg/dL — ABNORMAL HIGH (ref 70–99)
Glucose-Capillary: 159 mg/dL — ABNORMAL HIGH (ref 70–99)

## 2019-10-14 LAB — MAGNESIUM: Magnesium: 1.7 mg/dL (ref 1.7–2.4)

## 2019-10-14 MED ORDER — MORPHINE SULFATE ER 15 MG PO TBCR: 15.0000 mg | EXTENDED_RELEASE_TABLET | Freq: Two times a day (BID) | ORAL | 0 refills | Status: AC

## 2019-10-14 MED ORDER — OXYCODONE HCL ER 20 MG PO T12A: 20.0000 mg | EXTENDED_RELEASE_TABLET | Freq: Two times a day (BID) | ORAL | 0 refills | Status: DC

## 2019-10-14 NOTE — Progress Notes (Signed)
IV removed and discharge instructions reveiwed.  Wife to drive home

## 2019-10-14 NOTE — Discharge Summary (Signed)
Physician Discharge Summary  Charles [name] PXT:[id number] DOB: [birth year] DOA: 10/11/2019  PCP: Charles Chroman, MD  Admit date: 10/11/2019 Discharge date: 10/14/2019  Admitted From: home Disposition:  home  Recommendations for Outpatient Follow-up:  1. Home with hospice services  Discharge Condition:hospice CODE STATUS:DNR Diet recommendation: regular diet for comfort  Brief/Interim Summary: OEV:[id number] Charles Dickersonis a 69 y.o.malewith medical history significant forcolorectal cancer with metastases to pancreas, liver, and lungs, type 2 diabetes mellitus, history of PE now on Xarelto, and chronic pain related to his cancer, now presenting to the emergency department for evaluation of malaise, fatigue, and confusion. Patient was recently started on [place] by his oncologist, developed persistent nausea and vomiting, was treated with IV fluids and scopolamine patch at the cancer center on 10/10/2019, but then this morning per report of his wife, he had increased fatigue and was confused. Patient is normally fully oriented and able to take care of his own ADLs, but was disoriented this morning and requiring assistance with dressing and eating. Patient complained of general malaise but nothing else. By time of admission, the patient is oriented and able to provide a history, reporting that his chronic right-sided abdominal pain is unchanged, he does not have any cough or shortness of breath, and he has not noticed any change in his stool. He denies headache, chest pain, or palpitations  Discharge Diagnoses:  Principal Problem:   SIRS (systemic inflammatory response syndrome) (HCC) Active Problems:   History of pulmonary embolism   Type 2 diabetes mellitus ([place])   Metastatic colorectal cancer ([place])   Acute encephalopathy   Hyponatremia   Coagulopathy ([place])   Palliative care by specialist  1. SIRS.  Currently, does not appear to bean obvious source of sepsis at this time.  He was  febrile on admission which has since improved.    No further fever since admission.  Urine culture and blood cultures were unremarkable.  No evidence of pneumonia.  He was treated with intravenous antibiotics, but this will be discontinued since no clear source of infection. 2. Metastatic rectal cancer.  Status post resection, radiation and chemotherapy.  He has known metastases to pancreas, liver and lung.  He is no longer on IV chemotherapy.  Recently started on oral chemotherapy which she did not tolerate and stopped taking few days ago.  He decided not to continue with further chemotherapy.  Palliative care consulted to help address further goals of care. 3. Acute encephalopathy.  Suspect this related to his fever and acute illness.  After discussing with his wife, patient may also have some degree of mild cognitive impairment at baseline which has been exacerbated by his illness.  Currently, patient's mental status appears to be improved and approaching baseline. 4. History of PE, chronically on Xarelto.  INR is supratherapeutic. 5. Diabetes.  patient was having episodes of hypoglycemia during his hospitalization.  His p.o. intake has been poor.  Will discontinue further oral hypoglycemics at this time. 6. Chronic pain related to malignancy.  He is chronically on oxycodone.  He was recently started on OxyContin.  Since the patient was having episodes of confusion, his long-acting pain medication dose has been reduced.  Long-acting pain meds changed from OxyContin to MS Contin since OxyContin was not covered by insurance. 7. Goals of care.  Seen by palliative care and patient made DNR.  Patient/family are electing to discharge home with hospice services   Discharge Instructions  Discharge Instructions    Diet - low sodium heart healthy  Complete by: As directed    Increase activity slowly   Complete by: As directed      Allergies as of 10/14/2019      Reactions   Oxaliplatin Other (See  Comments)   Chest pain   Oxycodone    Blisters, hallucinations (takes Percocet at home)   Tramadol    Blisters, hallucinations   Trazodone And Nefazodone Other (See Comments)   hallucinations      Medication List    STOP taking these medications   glimepiride 2 MG tablet Commonly known as: AMARYL   magic mouthwash w/lidocaine Soln   prochlorperazine 10 MG tablet Commonly known as: COMPAZINE     TAKE these medications   acetaminophen 500 MG tablet Commonly known as: TYLENOL Take 1,000 mg by mouth every 6 (six) hours as needed for mild pain or moderate pain.   clonazePAM 1 MG tablet Commonly known as: KLONOPIN Take 1 tablet (1 mg total) by mouth 3 (three) times daily as needed. What changed: reasons to take this   gabapentin 300 MG capsule Commonly known as: NEURONTIN Take 3 capsules (900 mg total) by mouth 2 (two) times daily.   magnesium oxide 400 (241.3 Mg) MG tablet Commonly known as: MAG-OX Take 1 tablet (400 mg total) by mouth 3 (three) times daily.   megestrol 400 MG/10ML suspension Commonly known as: MEGACE Take 10 mLs (400 mg total) by mouth 2 (two) times daily.   mirtazapine 15 MG tablet Commonly known as: REMERON Take 1 tablet (15 mg total) by mouth at bedtime.   morphine 15 MG 12 hr tablet Commonly known as: MS Contin Take 1 tablet (15 mg total) by mouth every 12 (twelve) hours.   oxyCODONE 15 MG immediate release tablet Commonly known as: ROXICODONE Take 1 tablet (15 mg total) by mouth every 4 (four) hours as needed. for pain What changed: Another medication with the same name was removed. Continue taking this medication, and follow the directions you see here.   pantoprazole 40 MG tablet Commonly known as: PROTONIX Take 40 mg by mouth every evening.   PROBIOTIC DAILY PO Take 1 capsule by mouth 2 (two) times daily.   rivaroxaban 20 MG Tabs tablet Commonly known as: XARELTO Take 1 tablet (20 mg total) by mouth daily with supper.    scopolamine 1 MG/3DAYS Commonly known as: TRANSDERM-SCOP Place 1 patch (1.5 mg total) onto the skin every 3 (three) days.   vitamin B-12 1000 MCG tablet Commonly known as: CYANOCOBALAMIN Take 1,000 mcg by mouth daily.            Durable Medical Equipment  (From admission, onward)         Start     Ordered   10/14/19 1106  For home use only DME Hospital bed  Once       Question Answer Comment  Length of Need Lifetime   Patient has (list medical condition): Metastatic cancer with bone mets   The above medical condition requires: Patient requires the ability to reposition frequently   Head must be elevated greater than: 30 degrees   Bed type Semi-electric      10/14/19 1106   10/14/19 1105  For home use only DME Walker tall  Once       Question:  Patient needs a walker to treat with the following condition  Answer:  Generalized weakness   10/14/19 1106          Allergies  Allergen Reactions  . Oxaliplatin Other (See  Comments)    Chest pain  . Oxycodone     Blisters, hallucinations (takes Percocet at home)  . Tramadol     Blisters, hallucinations  . Trazodone And Nefazodone Other (See Comments)    hallucinations    Consultations:  Palliative care   Procedures/Studies: CT Head Wo Contrast  Result Date: 10/11/2019 CLINICAL DATA:  69 year old male with history of generalize weakness. In cephalopathy. EXAM: CT HEAD WITHOUT CONTRAST TECHNIQUE: Contiguous axial images were obtained from the base of the skull through the vertex without intravenous contrast. COMPARISON:  Head CT 06/17/2016. FINDINGS: Brain: Mild cerebral atrophy patchy and confluent areas of decreased attenuation are noted throughout the deep and periventricular white matter of the cerebral hemispheres bilaterally, compatible with chronic microvascular ischemic disease. No evidence of acute infarction, hemorrhage, hydrocephalus, extra-axial collection or mass lesion/mass effect. Vascular: No hyperdense  vessel or unexpected calcification. Skull: Normal. Negative for fracture or focal lesion. Sinuses/Orbits: No acute finding. Other: None. IMPRESSION: 1. No acute intracranial abnormalities. 2. Mild cerebral atrophy with chronic microvascular ischemic changes in the cerebral white matter, as above. Electronically Signed   By: Vinnie Langton M.D.   On: 10/11/2019 20:19   CT Chest W Contrast  Result Date: 09/23/2019 CLINICAL DATA:  Rectal cancer, status post proctectomy with colorectal anastomosis, status post ileostomy, and status post radiation therapy. Lung metastases. EXAM: CT CHEST, ABDOMEN, AND PELVIS WITH CONTRAST TECHNIQUE: Multidetector CT imaging of the chest, abdomen and pelvis was performed following the standard protocol during bolus administration of intravenous contrast. CONTRAST:  162mL OMNIPAQUE IOHEXOL 300 MG/ML  SOLN COMPARISON:  07/16/2019 FINDINGS: CT CHEST FINDINGS Cardiovascular: Heart is normal in size.  No pericardial effusion. No evidence of thoracic aortic aneurysm. Mild atherosclerotic calcifications of the aortic arch. Mild three-vessel coronary atherosclerosis. Right chest port terminates in the lower SVC. Mediastinum/Nodes: Small mediastinal nodes which do not meet pathologic CT size criteria. Visualized thyroid is unremarkable. Lungs/Pleura: Progressive right lung metastases, including: --14 mm subpleural nodule in the posterior right upper lobe (series 3/image 20), previously 13 mm --14 mm subpleural nodule in the posterior right upper lobe along the major fissure (series 3/image 74), previously 13 mm when measured in a similar fashion --16 mm subpleural nodule in the posterior right lower lobe (series 3/image 124), obscured by pleural fluid on the prior but likely measuring 12 mm in retrospect --additional subcentimeter nodules in the right upper, middle, and lower lobes which are progressive Mild biapical pleural-parenchymal scarring. Trace right pleural effusion, stable versus  mildly improved. No pneumothorax. Musculoskeletal: Degenerative changes of the thoracic spine. Right posteromedial 11th rib metastasis (series 2/image 85), similar. Right lower sternal metastasis (series 2/image 46), similar. CT ABDOMEN PELVIS FINDINGS Hepatobiliary: Progressive hepatic metastases. Two dominant lesions along the posterior aspect of segment 7 measure 2.3 and 2.7 cm (series 2/image 55-56), previously measuring less than 10 mm. 1.6 cm lesion along the gallbladder fossa in segment 4B (series 2/image 84), new. Dominant 3.2 cm lesion in the inferior aspect of segment 6 (series 2/image 71), previously 1.9 cm. Gallbladder is unremarkable. No intrahepatic or extrahepatic ductal dilatation. Pneumobilia.  Indwelling common duct stent in satisfactory position. Pancreas: Progressive hypoenhancing mass in the pancreatic head/uncinate process, measuring 3.4 x 3.0 cm (series 2/image 71), previously 2.4 x 2.8 cm when measured in a similar fashion. Associated atrophy of the pancreatic body/tail with ductal dilatation. Spleen: Within normal limits. Adrenals/Urinary Tract: Chronic left adrenal calcification. Right adrenal gland is within normal limits. Kidneys are within normal limits.  No hydronephrosis.  Thick-walled bladder, although underdistended. Stomach/Bowel: Stomach is within normal limits. No evidence of bowel obstruction. Normal appendix (series 2/image 91). Status post left hemicolectomy with low rectal anastomosis (series 2/image 119). Left mid abdominal transverse colostomy with mucous fistula (series 2/image 90). Vascular/Lymphatic: No evidence of abdominal aortic aneurysm. Atherosclerotic calcifications of the abdominal aorta and branch vessels. IVC filter centered at the L2 level. Dominant 16 mm short axis peripancreatic node (series 2/image 70), previously 10 mm. Reproductive: Prostate is notable for dystrophic calcifications. Other: No abdominopelvic ascites. Musculoskeletal: Mild degenerative changes  of the lumbar spine. IMPRESSION: Postsurgical changes involving the left colon. Progressive right pulmonary metastases, with dominant lesions as above. Trace right pleural effusion, stable versus mildly improved. Progressive hepatic metastases, with dominant lesions above. Progressive pancreatic metastasis and peripancreatic nodal metastasis. Osseous metastases involving the right posterior limits rib and right inferior sternum, grossly unchanged. Additional ancillary findings as above. Electronically Signed   By: Julian Hy M.D.   On: 09/23/2019 09:45   CT Abdomen Pelvis W Contrast  Result Date: 09/23/2019 CLINICAL DATA:  Rectal cancer, status post proctectomy with colorectal anastomosis, status post ileostomy, and status post radiation therapy. Lung metastases. EXAM: CT CHEST, ABDOMEN, AND PELVIS WITH CONTRAST TECHNIQUE: Multidetector CT imaging of the chest, abdomen and pelvis was performed following the standard protocol during bolus administration of intravenous contrast. CONTRAST:  116mL OMNIPAQUE IOHEXOL 300 MG/ML  SOLN COMPARISON:  07/16/2019 FINDINGS: CT CHEST FINDINGS Cardiovascular: Heart is normal in size.  No pericardial effusion. No evidence of thoracic aortic aneurysm. Mild atherosclerotic calcifications of the aortic arch. Mild three-vessel coronary atherosclerosis. Right chest port terminates in the lower SVC. Mediastinum/Nodes: Small mediastinal nodes which do not meet pathologic CT size criteria. Visualized thyroid is unremarkable. Lungs/Pleura: Progressive right lung metastases, including: --14 mm subpleural nodule in the posterior right upper lobe (series 3/image 20), previously 13 mm --14 mm subpleural nodule in the posterior right upper lobe along the major fissure (series 3/image 74), previously 13 mm when measured in a similar fashion --16 mm subpleural nodule in the posterior right lower lobe (series 3/image 124), obscured by pleural fluid on the prior but likely measuring 12 mm  in retrospect --additional subcentimeter nodules in the right upper, middle, and lower lobes which are progressive Mild biapical pleural-parenchymal scarring. Trace right pleural effusion, stable versus mildly improved. No pneumothorax. Musculoskeletal: Degenerative changes of the thoracic spine. Right posteromedial 11th rib metastasis (series 2/image 85), similar. Right lower sternal metastasis (series 2/image 46), similar. CT ABDOMEN PELVIS FINDINGS Hepatobiliary: Progressive hepatic metastases. Two dominant lesions along the posterior aspect of segment 7 measure 2.3 and 2.7 cm (series 2/image 55-56), previously measuring less than 10 mm. 1.6 cm lesion along the gallbladder fossa in segment 4B (series 2/image 84), new. Dominant 3.2 cm lesion in the inferior aspect of segment 6 (series 2/image 71), previously 1.9 cm. Gallbladder is unremarkable. No intrahepatic or extrahepatic ductal dilatation. Pneumobilia.  Indwelling common duct stent in satisfactory position. Pancreas: Progressive hypoenhancing mass in the pancreatic head/uncinate process, measuring 3.4 x 3.0 cm (series 2/image 71), previously 2.4 x 2.8 cm when measured in a similar fashion. Associated atrophy of the pancreatic body/tail with ductal dilatation. Spleen: Within normal limits. Adrenals/Urinary Tract: Chronic left adrenal calcification. Right adrenal gland is within normal limits. Kidneys are within normal limits.  No hydronephrosis. Thick-walled bladder, although underdistended. Stomach/Bowel: Stomach is within normal limits. No evidence of bowel obstruction. Normal appendix (series 2/image 91). Status post left hemicolectomy with low rectal anastomosis (series 2/image 119).  Left mid abdominal transverse colostomy with mucous fistula (series 2/image 90). Vascular/Lymphatic: No evidence of abdominal aortic aneurysm. Atherosclerotic calcifications of the abdominal aorta and branch vessels. IVC filter centered at the L2 level. Dominant 16 mm short  axis peripancreatic node (series 2/image 70), previously 10 mm. Reproductive: Prostate is notable for dystrophic calcifications. Other: No abdominopelvic ascites. Musculoskeletal: Mild degenerative changes of the lumbar spine. IMPRESSION: Postsurgical changes involving the left colon. Progressive right pulmonary metastases, with dominant lesions as above. Trace right pleural effusion, stable versus mildly improved. Progressive hepatic metastases, with dominant lesions above. Progressive pancreatic metastasis and peripancreatic nodal metastasis. Osseous metastases involving the right posterior limits rib and right inferior sternum, grossly unchanged. Additional ancillary findings as above. Electronically Signed   By: Julian Hy M.D.   On: 09/23/2019 09:45   DG Chest Port 1 View  Result Date: 10/11/2019 CLINICAL DATA:  69 year old male with history of fever. Generalized weakness. EXAM: PORTABLE CHEST 1 VIEW COMPARISON:  Chest x-ray 07/20/2019. FINDINGS: Right subclavian single-lumen power porta cath with tip terminating at the superior cavoatrial junction. Lung volumes are normal. No consolidative airspace disease. No pleural effusions. No pneumothorax. No pulmonary nodule or mass noted. Pulmonary vasculature and the cardiomediastinal silhouette are within normal limits. IMPRESSION: 1.  No radiographic evidence of acute cardiopulmonary disease. Electronically Signed   By: Vinnie Langton M.D.   On: 10/11/2019 19:42       Subjective: No cough, shortness of breath, nausea or vomiting  Discharge Exam: Vitals:   10/13/19 2200 10/14/19 0443 10/14/19 0521 10/14/19 1500  BP:  107/63  119/61  Pulse:  92  (!) 110  Resp:  15  19  Temp: 100.1 F (37.8 C) (!) 96.3 F (35.7 C) (!) 97.5 F (36.4 C) 98.7 F (37.1 C)  TempSrc: Oral Axillary Oral Oral  SpO2:  95%  96%  Weight:      Height:        General: Pt is alert, awake, not in acute distress Cardiovascular: RRR, S1/S2 +, no rubs, no  gallops Respiratory: CTA bilaterally, no wheezing, no rhonchi Abdominal: Soft, NT, ND, bowel sounds + Extremities: no edema, no cyanosis    The results of significant diagnostics from this hospitalization (including imaging, microbiology, ancillary and laboratory) are listed below for reference.     Microbiology: Recent Results (from the past 240 hour(s))  Blood Culture (routine x 2)     Status: None (Preliminary result)   Collection Time: 10/11/19  7:25 PM   Specimen: BLOOD  Result Value Ref Range Status   Specimen Description BLOOD RIGHT ANTECUBITAL  Final   Special Requests   Final    BOTTLES DRAWN AEROBIC AND ANAEROBIC Blood Culture adequate volume   Culture   Final    NO GROWTH 3 DAYS Performed at [facility], [address], [place], [place] [address]    Report Status PENDING  Incomplete  Urine culture     Status: Abnormal   Collection Time: 10/11/19  7:25 PM   Specimen: In/Out Cath Urine  Result Value Ref Range Status   Specimen Description   Final    IN/OUT CATH URINE Performed at [facility], [address], [place], [place] [address]    Special Requests   Final    NONE Performed at [facility], [address] [place], [place] [address]    Culture (A)  Final    <10,000 COLONIES/mL INSIGNIFICANT GROWTH Performed at [facility], [place] [address], [place], [place] [address]  Report Status 10/13/2019 FINAL  Final  SARS Coronavirus 2 by RT PCR (hospital order, performed in [facility] hospital lab) Nasopharyngeal Nasopharyngeal Swab     Status: None   Collection Time: 10/11/19  7:26 PM   Specimen: Nasopharyngeal Swab  Result Value Ref Range Status   SARS Coronavirus 2 NEGATIVE NEGATIVE Final    Comment: (NOTE) SARS-CoV-2 target nucleic acids are NOT DETECTED. The SARS-CoV-2 RNA is generally detectable in upper and lower respiratory specimens during the acute phase of infection. The lowest concentration of SARS-CoV-2 viral copies this assay can detect is  250 copies / mL. A negative result does not preclude SARS-CoV-2 infection and should not be used as the sole basis for treatment or other patient management decisions.  A negative result may occur with improper specimen collection / handling, submission of specimen other than nasopharyngeal swab, presence of viral mutation(s) within the areas targeted by this assay, and inadequate number of viral copies (<250 copies / mL). A negative result must be combined with clinical observations, patient history, and epidemiological information. Fact Sheet for Patients:   StrictlyIdeas.no Fact Sheet for Healthcare Providers: BankingDealers.co.za This test is not yet approved or cleared  by the [place] FDA and has been authorized for detection and/or diagnosis of SARS-CoV-2 by FDA under an Emergency Use Authorization (EUA).  This EUA will remain in effect (meaning this test can be used) for the duration of the COVID-19 declaration under Section 564(b)(1) of the Act, 21 U.S.C. section 360bbb-3(b)(1), unless the authorization is terminated or revoked sooner. Performed at [facility], [address] [place], [place] [address]   Blood Culture (routine x 2)     Status: None (Preliminary result)   Collection Time: 10/11/19  7:55 PM   Specimen: BLOOD LEFT HAND  Result Value Ref Range Status   Specimen Description BLOOD LEFT HAND  Final   Special Requests   Final    BOTTLES DRAWN AEROBIC ONLY Blood Culture adequate volume   Culture   Final    NO GROWTH 3 DAYS Performed at [facility], [address] [place], [place] [address]    Report Status PENDING  Incomplete     Labs: BNP (last 3 results) No results for input(s): BNP in the last 8760 hours. Basic Metabolic Panel: Recent Labs  Lab 10/10/19 0917 10/11/19 1925 10/12/19 0545 10/13/19 0527 10/14/19 0823  NA 132* 132* 133* 132* 131*  132*  K 3.9 4.1 3.4* 3.6 4.0  3.9  CL 96* 94* 99 100  100  101  CO2 25 27 25 23 22  23   GLUCOSE 155* 112* 55* 74 109*  114*  BUN 11 14 11 9 9  9   CREATININE 0.62 0.84 0.66 0.67 0.62  0.60*  CALCIUM 8.4* 8.2* 7.7* 7.8* 7.8*  7.9*  MG 1.7  --   --  1.6* 1.7  PHOS  --   --   --   --  1.7*   Liver Function Tests: Recent Labs  Lab 10/10/19 0917 10/11/19 1925 10/12/19 0545 10/13/19 0527 10/14/19 0823  AST 31 32 32 42* 42*  ALT 25 27 25 31 30   ALKPHOS 124 113 88 92 82  BILITOT 2.6* 1.4* 1.5* 1.2 0.8  PROT 6.5 6.5 5.5* 5.6* 5.0*  ALBUMIN 2.8* 2.7* 2.3* 2.2* 2.1*  2.0*   No results for input(s): LIPASE, AMYLASE in the last 168 hours. Recent Labs  Lab 10/12/19 0011  AMMONIA 22   CBC: Recent Labs  Lab 10/10/19 0917 10/11/19  1925 10/12/19 0545 10/13/19 0527 10/14/19 0823  WBC 9.7 9.1 12.6* 9.3 8.4  NEUTROABS 7.8* 7.4  --   --   --   HGB 11.6* 10.8* 10.1* 10.8* 10.4*  HCT 35.9* 33.6* 32.3* 35.4* 33.0*  MCV 85.7 86.8 87.1 88.9 85.9  PLT 187 188 166 164 163   Cardiac Enzymes: No results for input(s): CKTOTAL, CKMB, CKMBINDEX, TROPONINI in the last 168 hours. BNP: Invalid input(s): POCBNP CBG: Recent Labs  Lab 10/13/19 1136 10/13/19 1556 10/13/19 2114 10/14/19 0747 10/14/19 1141  GLUCAP 134* 134* 126* 117* 159*   D-Dimer No results for input(s): DDIMER in the last 72 hours. Hgb A1c No results for input(s): HGBA1C in the last 72 hours. Lipid Profile No results for input(s): CHOL, HDL, LDLCALC, TRIG, CHOLHDL, LDLDIRECT in the last 72 hours. Thyroid function studies No results for input(s): TSH, T4TOTAL, T3FREE, THYROIDAB in the last 72 hours.  Invalid input(s): FREET3 Anemia work up No results for input(s): VITAMINB12, FOLATE, FERRITIN, TIBC, IRON, RETICCTPCT in the last 72 hours. Urinalysis    Component Value Date/Time   COLORURINE YELLOW 10/13/2019 2110   APPEARANCEUR CLEAR 10/13/2019 2110   LABSPEC 1.018 10/13/2019 2110   PHURINE 5.0 10/13/2019 2110   GLUCOSEU NEGATIVE 10/13/2019 2110   HGBUR SMALL  (A) 10/13/2019 2110   BILIRUBINUR NEGATIVE 10/13/2019 2110   KETONESUR NEGATIVE 10/13/2019 2110   PROTEINUR NEGATIVE 10/13/2019 2110   UROBILINOGEN 1.0 11/14/2014 1504   NITRITE NEGATIVE 10/13/2019 2110   LEUKOCYTESUR NEGATIVE 10/13/2019 2110   Sepsis Labs Invalid input(s): PROCALCITONIN,  WBC,  LACTICIDVEN Microbiology Recent Results (from the past 240 hour(s))  Blood Culture (routine x 2)     Status: None (Preliminary result)   Collection Time: 10/11/19  7:25 PM   Specimen: BLOOD  Result Value Ref Range Status   Specimen Description BLOOD RIGHT ANTECUBITAL  Final   Special Requests   Final    BOTTLES DRAWN AEROBIC AND ANAEROBIC Blood Culture adequate volume   Culture   Final    NO GROWTH 3 DAYS Performed at [facility], [address] [place], [place] [address]    Report Status PENDING  Incomplete  Urine culture     Status: Abnormal   Collection Time: 10/11/19  7:25 PM   Specimen: In/Out Cath Urine  Result Value Ref Range Status   Specimen Description   Final    IN/OUT CATH URINE Performed at [facility], [address] [place], [place] [address]    Special Requests   Final    NONE Performed at [facility], [address] [place], [place] [address]    Culture (A)  Final    <10,000 COLONIES/mL INSIGNIFICANT GROWTH Performed at [facility], [place] [address] [place], [place] [address]    Report Status 10/13/2019 FINAL  Final  SARS Coronavirus 2 by RT PCR (hospital order, performed in [facility] hospital lab) Nasopharyngeal Nasopharyngeal Swab     Status: None   Collection Time: 10/11/19  7:26 PM   Specimen: Nasopharyngeal Swab  Result Value Ref Range Status   SARS Coronavirus 2 NEGATIVE NEGATIVE Final    Comment: (NOTE) SARS-CoV-2 target nucleic acids are NOT DETECTED. The SARS-CoV-2 RNA is generally detectable in upper and lower respiratory specimens during the acute phase of infection. The lowest concentration of SARS-CoV-2 viral copies this assay can  detect is 250 copies / mL. A negative result does not preclude SARS-CoV-2 infection and should not be used as the sole basis for treatment or  other patient management decisions.  A negative result may occur with improper specimen collection / handling, submission of specimen other than nasopharyngeal swab, presence of viral mutation(s) within the areas targeted by this assay, and inadequate number of viral copies (<250 copies / mL). A negative result must be combined with clinical observations, patient history, and epidemiological information. Fact Sheet for Patients:   StrictlyIdeas.no Fact Sheet for Healthcare Providers: BankingDealers.co.za This test is not yet approved or cleared  by the [place] FDA and has been authorized for detection and/or diagnosis of SARS-CoV-2 by FDA under an Emergency Use Authorization (EUA).  This EUA will remain in effect (meaning this test can be used) for the duration of the COVID-19 declaration under Section 564(b)(1) of the Act, 21 U.S.C. section 360bbb-3(b)(1), unless the authorization is terminated or revoked sooner. Performed at [facility], [address] [place], [place] [address]   Blood Culture (routine x 2)     Status: None (Preliminary result)   Collection Time: 10/11/19  7:55 PM   Specimen: BLOOD LEFT HAND  Result Value Ref Range Status   Specimen Description BLOOD LEFT HAND  Final   Special Requests   Final    BOTTLES DRAWN AEROBIC ONLY Blood Culture adequate volume   Culture   Final    NO GROWTH 3 DAYS Performed at [facility], [address], [place], [place] [address]    Report Status PENDING  Incomplete     Time coordinating discharge: 43mins  SIGNED:   Kathie Dike, MD  [facility] 10/14/2019, 4:43 PM   If 7PM-7AM, please contact night-coverage [email or web address]

## 2019-10-14 NOTE — Clinical Social Work Note (Signed)
CSW faxed medication list to [place], per Cassandra's request.  Tobi Bastos, LCSW [facility] [place] Worker [facility] Ph: [phone]

## 2019-10-14 NOTE — Care Management Important Message (Signed)
Important Message  Patient Details  Name: Charles [name] MRN: [id number] Date of Birth: Aug 30, 1950   Medicare Important Message Given:  Yes     Tommy Medal 10/14/2019, 1:30 PM

## 2019-10-14 NOTE — TOC Progression Note (Signed)
Transition of Care [facility]) - Progression Note   Patient Details  Name: Charles [name] MRN: [id number] Date of Birth: 1950/09/26  [facility]) CM/SW [place], LCSW Phone Number: 10/14/2019, 12:15 PM  Clinical Narrative: [facility] received consult for home hospice services. CSW spoke with patient's wife, [name], to discuss home hospice referral. Wife requested referral be made to [facility]. Wife reported patient will need a hospital bed and a tall walker as patient is 6'2'', however, patient will not need the hospital bed immediately at the time of discharge. Wife also requested PT for the patient at home, if possible.  CSW called Cassandra with [facility] to make referral. CSW provided Cassandra with patient's DME needs as well as wife's request for PT. Per Cassandra, patient will be assessed by hospice and can receive PT if deemed appropriate. CSW received voicemail from patient's daughter, [name], requesting call back to discuss patient's hospice. Daughter reported she was calling as her mother seemed confused by how hospice was being set up. CSW provided clarification to daughter. Daughter appreciative and stated she would follow up with her mother. Hospitalist updated with DME order needs.  Expected Discharge Plan: [place] Barriers to Discharge: Continued Medical Work up  Expected Discharge Plan and Services Expected Discharge Plan: [place] In-house Referral: Clinical Social Work Post Acute Care Choice: Resumption of Svcs/PTA Provider Living arrangements for the past 2 months: Single Family Home  Readmission Risk Interventions Readmission Risk Prevention Plan 10/12/2019 02/25/2019 02/24/2019  Transportation Screening Complete Complete Complete  HRI or Home Care Consult - - Not Complete  HRI or Home Care Consult comments - - declined  Social Work Consult for [place]  Planning/Counseling - - Complete  Palliative Care Screening - - Not Applicable  Medication Review Press photographer) Complete - Complete  SW Recovery Care/Counseling Consult Complete - -  [place] Not Applicable - -  Some recent data might be hidden

## 2019-10-14 NOTE — Progress Notes (Signed)
Sat in chair for several hours this morning. Assisted back to bed.  Colostomy bag emptied. Denied needing anything for pain. Wife at bedside

## 2019-10-15 DIAGNOSIS — F419 Anxiety disorder, unspecified: Secondary | ICD-10-CM | POA: Diagnosis not present

## 2019-10-15 DIAGNOSIS — G4700 Insomnia, unspecified: Secondary | ICD-10-CM | POA: Diagnosis not present

## 2019-10-15 DIAGNOSIS — C20 Malignant neoplasm of rectum: Secondary | ICD-10-CM | POA: Diagnosis not present

## 2019-10-15 DIAGNOSIS — R112 Nausea with vomiting, unspecified: Secondary | ICD-10-CM | POA: Diagnosis not present

## 2019-10-15 DIAGNOSIS — Z86718 Personal history of other venous thrombosis and embolism: Secondary | ICD-10-CM | POA: Diagnosis not present

## 2019-10-15 DIAGNOSIS — R52 Pain, unspecified: Secondary | ICD-10-CM | POA: Diagnosis not present

## 2019-10-15 DIAGNOSIS — K219 Gastro-esophageal reflux disease without esophagitis: Secondary | ICD-10-CM | POA: Diagnosis not present

## 2019-10-15 DIAGNOSIS — E119 Type 2 diabetes mellitus without complications: Secondary | ICD-10-CM | POA: Diagnosis not present

## 2019-10-17 ENCOUNTER — Ambulatory Visit (HOSPITAL_COMMUNITY): Payer: Medicare Other

## 2019-10-17 ENCOUNTER — Inpatient Hospital Stay (HOSPITAL_COMMUNITY): Payer: Medicare Other

## 2019-10-17 DIAGNOSIS — C20 Malignant neoplasm of rectum: Secondary | ICD-10-CM | POA: Diagnosis not present

## 2019-10-17 DIAGNOSIS — G4700 Insomnia, unspecified: Secondary | ICD-10-CM | POA: Diagnosis not present

## 2019-10-17 DIAGNOSIS — R112 Nausea with vomiting, unspecified: Secondary | ICD-10-CM | POA: Diagnosis not present

## 2019-10-17 DIAGNOSIS — K219 Gastro-esophageal reflux disease without esophagitis: Secondary | ICD-10-CM | POA: Diagnosis not present

## 2019-10-17 DIAGNOSIS — F419 Anxiety disorder, unspecified: Secondary | ICD-10-CM | POA: Diagnosis not present

## 2019-10-17 DIAGNOSIS — R52 Pain, unspecified: Secondary | ICD-10-CM | POA: Diagnosis not present

## 2019-10-17 LAB — CULTURE, BLOOD (ROUTINE X 2)
Culture: NO GROWTH
Culture: NO GROWTH
Special Requests: ADEQUATE
Special Requests: ADEQUATE

## 2019-10-18 DIAGNOSIS — R112 Nausea with vomiting, unspecified: Secondary | ICD-10-CM | POA: Diagnosis not present

## 2019-10-18 DIAGNOSIS — K219 Gastro-esophageal reflux disease without esophagitis: Secondary | ICD-10-CM | POA: Diagnosis not present

## 2019-10-18 DIAGNOSIS — R52 Pain, unspecified: Secondary | ICD-10-CM | POA: Diagnosis not present

## 2019-10-18 DIAGNOSIS — G4700 Insomnia, unspecified: Secondary | ICD-10-CM | POA: Diagnosis not present

## 2019-10-18 DIAGNOSIS — C20 Malignant neoplasm of rectum: Secondary | ICD-10-CM | POA: Diagnosis not present

## 2019-10-18 DIAGNOSIS — F419 Anxiety disorder, unspecified: Secondary | ICD-10-CM | POA: Diagnosis not present

## 2019-10-20 DIAGNOSIS — R112 Nausea with vomiting, unspecified: Secondary | ICD-10-CM | POA: Diagnosis not present

## 2019-10-20 DIAGNOSIS — K219 Gastro-esophageal reflux disease without esophagitis: Secondary | ICD-10-CM | POA: Diagnosis not present

## 2019-10-20 DIAGNOSIS — G4700 Insomnia, unspecified: Secondary | ICD-10-CM | POA: Diagnosis not present

## 2019-10-20 DIAGNOSIS — F419 Anxiety disorder, unspecified: Secondary | ICD-10-CM | POA: Diagnosis not present

## 2019-10-20 DIAGNOSIS — C20 Malignant neoplasm of rectum: Secondary | ICD-10-CM | POA: Diagnosis not present

## 2019-10-20 DIAGNOSIS — R52 Pain, unspecified: Secondary | ICD-10-CM | POA: Diagnosis not present

## 2019-10-24 ENCOUNTER — Other Ambulatory Visit (HOSPITAL_COMMUNITY): Payer: Medicare Other

## 2019-10-24 ENCOUNTER — Ambulatory Visit (HOSPITAL_COMMUNITY): Payer: Medicare Other | Admitting: Oncology

## 2019-10-24 DIAGNOSIS — R112 Nausea with vomiting, unspecified: Secondary | ICD-10-CM | POA: Diagnosis not present

## 2019-10-24 DIAGNOSIS — G4700 Insomnia, unspecified: Secondary | ICD-10-CM | POA: Diagnosis not present

## 2019-10-24 DIAGNOSIS — K219 Gastro-esophageal reflux disease without esophagitis: Secondary | ICD-10-CM | POA: Diagnosis not present

## 2019-10-24 DIAGNOSIS — R52 Pain, unspecified: Secondary | ICD-10-CM | POA: Diagnosis not present

## 2019-10-24 DIAGNOSIS — C20 Malignant neoplasm of rectum: Secondary | ICD-10-CM | POA: Diagnosis not present

## 2019-10-24 DIAGNOSIS — F419 Anxiety disorder, unspecified: Secondary | ICD-10-CM | POA: Diagnosis not present

## 2019-10-27 DIAGNOSIS — G4700 Insomnia, unspecified: Secondary | ICD-10-CM | POA: Diagnosis not present

## 2019-10-27 DIAGNOSIS — C20 Malignant neoplasm of rectum: Secondary | ICD-10-CM | POA: Diagnosis not present

## 2019-10-27 DIAGNOSIS — R112 Nausea with vomiting, unspecified: Secondary | ICD-10-CM | POA: Diagnosis not present

## 2019-10-27 DIAGNOSIS — F419 Anxiety disorder, unspecified: Secondary | ICD-10-CM | POA: Diagnosis not present

## 2019-10-27 DIAGNOSIS — K219 Gastro-esophageal reflux disease without esophagitis: Secondary | ICD-10-CM | POA: Diagnosis not present

## 2019-10-27 DIAGNOSIS — R52 Pain, unspecified: Secondary | ICD-10-CM | POA: Diagnosis not present

## 2019-10-31 DIAGNOSIS — R52 Pain, unspecified: Secondary | ICD-10-CM | POA: Diagnosis not present

## 2019-10-31 DIAGNOSIS — G4700 Insomnia, unspecified: Secondary | ICD-10-CM | POA: Diagnosis not present

## 2019-10-31 DIAGNOSIS — F419 Anxiety disorder, unspecified: Secondary | ICD-10-CM | POA: Diagnosis not present

## 2019-10-31 DIAGNOSIS — C20 Malignant neoplasm of rectum: Secondary | ICD-10-CM | POA: Diagnosis not present

## 2019-10-31 DIAGNOSIS — K219 Gastro-esophageal reflux disease without esophagitis: Secondary | ICD-10-CM | POA: Diagnosis not present

## 2019-10-31 DIAGNOSIS — R112 Nausea with vomiting, unspecified: Secondary | ICD-10-CM | POA: Diagnosis not present

## 2019-11-01 DIAGNOSIS — R112 Nausea with vomiting, unspecified: Secondary | ICD-10-CM | POA: Diagnosis not present

## 2019-11-01 DIAGNOSIS — F419 Anxiety disorder, unspecified: Secondary | ICD-10-CM | POA: Diagnosis not present

## 2019-11-01 DIAGNOSIS — C20 Malignant neoplasm of rectum: Secondary | ICD-10-CM | POA: Diagnosis not present

## 2019-11-01 DIAGNOSIS — K219 Gastro-esophageal reflux disease without esophagitis: Secondary | ICD-10-CM | POA: Diagnosis not present

## 2019-11-01 DIAGNOSIS — G4700 Insomnia, unspecified: Secondary | ICD-10-CM | POA: Diagnosis not present

## 2019-11-01 DIAGNOSIS — R52 Pain, unspecified: Secondary | ICD-10-CM | POA: Diagnosis not present

## 2019-11-02 DIAGNOSIS — R52 Pain, unspecified: Secondary | ICD-10-CM | POA: Diagnosis not present

## 2019-11-02 DIAGNOSIS — K219 Gastro-esophageal reflux disease without esophagitis: Secondary | ICD-10-CM | POA: Diagnosis not present

## 2019-11-02 DIAGNOSIS — F419 Anxiety disorder, unspecified: Secondary | ICD-10-CM | POA: Diagnosis not present

## 2019-11-02 DIAGNOSIS — C20 Malignant neoplasm of rectum: Secondary | ICD-10-CM | POA: Diagnosis not present

## 2019-11-02 DIAGNOSIS — G4700 Insomnia, unspecified: Secondary | ICD-10-CM | POA: Diagnosis not present

## 2019-11-02 DIAGNOSIS — R112 Nausea with vomiting, unspecified: Secondary | ICD-10-CM | POA: Diagnosis not present

## 2019-11-03 DIAGNOSIS — G4700 Insomnia, unspecified: Secondary | ICD-10-CM | POA: Diagnosis not present

## 2019-11-03 DIAGNOSIS — E119 Type 2 diabetes mellitus without complications: Secondary | ICD-10-CM | POA: Diagnosis not present

## 2019-11-03 DIAGNOSIS — R112 Nausea with vomiting, unspecified: Secondary | ICD-10-CM | POA: Diagnosis not present

## 2019-11-03 DIAGNOSIS — F419 Anxiety disorder, unspecified: Secondary | ICD-10-CM | POA: Diagnosis not present

## 2019-11-03 DIAGNOSIS — Z86718 Personal history of other venous thrombosis and embolism: Secondary | ICD-10-CM | POA: Diagnosis not present

## 2019-11-03 DIAGNOSIS — R52 Pain, unspecified: Secondary | ICD-10-CM | POA: Diagnosis not present

## 2019-11-03 DIAGNOSIS — C20 Malignant neoplasm of rectum: Secondary | ICD-10-CM | POA: Diagnosis not present

## 2019-11-03 DIAGNOSIS — K219 Gastro-esophageal reflux disease without esophagitis: Secondary | ICD-10-CM | POA: Diagnosis not present

## 2019-11-04 DIAGNOSIS — R112 Nausea with vomiting, unspecified: Secondary | ICD-10-CM | POA: Diagnosis not present

## 2019-11-04 DIAGNOSIS — G4700 Insomnia, unspecified: Secondary | ICD-10-CM | POA: Diagnosis not present

## 2019-11-04 DIAGNOSIS — F419 Anxiety disorder, unspecified: Secondary | ICD-10-CM | POA: Diagnosis not present

## 2019-11-04 DIAGNOSIS — R52 Pain, unspecified: Secondary | ICD-10-CM | POA: Diagnosis not present

## 2019-11-04 DIAGNOSIS — C20 Malignant neoplasm of rectum: Secondary | ICD-10-CM | POA: Diagnosis not present

## 2019-11-04 DIAGNOSIS — K219 Gastro-esophageal reflux disease without esophagitis: Secondary | ICD-10-CM | POA: Diagnosis not present

## 2019-11-08 ENCOUNTER — Other Ambulatory Visit (HOSPITAL_COMMUNITY): Payer: Medicare Other

## 2019-11-08 ENCOUNTER — Ambulatory Visit (HOSPITAL_COMMUNITY): Payer: Medicare Other | Admitting: Hematology

## 2019-11-08 DIAGNOSIS — R112 Nausea with vomiting, unspecified: Secondary | ICD-10-CM | POA: Diagnosis not present

## 2019-11-08 DIAGNOSIS — R52 Pain, unspecified: Secondary | ICD-10-CM | POA: Diagnosis not present

## 2019-11-08 DIAGNOSIS — K219 Gastro-esophageal reflux disease without esophagitis: Secondary | ICD-10-CM | POA: Diagnosis not present

## 2019-11-08 DIAGNOSIS — F419 Anxiety disorder, unspecified: Secondary | ICD-10-CM | POA: Diagnosis not present

## 2019-11-08 DIAGNOSIS — C20 Malignant neoplasm of rectum: Secondary | ICD-10-CM | POA: Diagnosis not present

## 2019-11-08 DIAGNOSIS — G4700 Insomnia, unspecified: Secondary | ICD-10-CM | POA: Diagnosis not present

## 2019-11-10 DIAGNOSIS — K219 Gastro-esophageal reflux disease without esophagitis: Secondary | ICD-10-CM | POA: Diagnosis not present

## 2019-11-10 DIAGNOSIS — F419 Anxiety disorder, unspecified: Secondary | ICD-10-CM | POA: Diagnosis not present

## 2019-11-10 DIAGNOSIS — R52 Pain, unspecified: Secondary | ICD-10-CM | POA: Diagnosis not present

## 2019-11-10 DIAGNOSIS — R112 Nausea with vomiting, unspecified: Secondary | ICD-10-CM | POA: Diagnosis not present

## 2019-11-10 DIAGNOSIS — G4700 Insomnia, unspecified: Secondary | ICD-10-CM | POA: Diagnosis not present

## 2019-11-10 DIAGNOSIS — C20 Malignant neoplasm of rectum: Secondary | ICD-10-CM | POA: Diagnosis not present

## 2019-11-11 DIAGNOSIS — C20 Malignant neoplasm of rectum: Secondary | ICD-10-CM | POA: Diagnosis not present

## 2019-11-11 DIAGNOSIS — R112 Nausea with vomiting, unspecified: Secondary | ICD-10-CM | POA: Diagnosis not present

## 2019-11-11 DIAGNOSIS — G4700 Insomnia, unspecified: Secondary | ICD-10-CM | POA: Diagnosis not present

## 2019-11-11 DIAGNOSIS — F419 Anxiety disorder, unspecified: Secondary | ICD-10-CM | POA: Diagnosis not present

## 2019-11-11 DIAGNOSIS — R52 Pain, unspecified: Secondary | ICD-10-CM | POA: Diagnosis not present

## 2019-11-11 DIAGNOSIS — K219 Gastro-esophageal reflux disease without esophagitis: Secondary | ICD-10-CM | POA: Diagnosis not present

## 2019-11-14 DIAGNOSIS — R112 Nausea with vomiting, unspecified: Secondary | ICD-10-CM | POA: Diagnosis not present

## 2019-11-14 DIAGNOSIS — G4700 Insomnia, unspecified: Secondary | ICD-10-CM | POA: Diagnosis not present

## 2019-11-14 DIAGNOSIS — K219 Gastro-esophageal reflux disease without esophagitis: Secondary | ICD-10-CM | POA: Diagnosis not present

## 2019-11-14 DIAGNOSIS — R52 Pain, unspecified: Secondary | ICD-10-CM | POA: Diagnosis not present

## 2019-11-14 DIAGNOSIS — F419 Anxiety disorder, unspecified: Secondary | ICD-10-CM | POA: Diagnosis not present

## 2019-11-14 DIAGNOSIS — C20 Malignant neoplasm of rectum: Secondary | ICD-10-CM | POA: Diagnosis not present

## 2019-11-16 DIAGNOSIS — C20 Malignant neoplasm of rectum: Secondary | ICD-10-CM | POA: Diagnosis not present

## 2019-11-16 DIAGNOSIS — F419 Anxiety disorder, unspecified: Secondary | ICD-10-CM | POA: Diagnosis not present

## 2019-11-16 DIAGNOSIS — G4700 Insomnia, unspecified: Secondary | ICD-10-CM | POA: Diagnosis not present

## 2019-11-16 DIAGNOSIS — R112 Nausea with vomiting, unspecified: Secondary | ICD-10-CM | POA: Diagnosis not present

## 2019-11-16 DIAGNOSIS — R52 Pain, unspecified: Secondary | ICD-10-CM | POA: Diagnosis not present

## 2019-11-16 DIAGNOSIS — K219 Gastro-esophageal reflux disease without esophagitis: Secondary | ICD-10-CM | POA: Diagnosis not present

## 2019-11-17 DIAGNOSIS — F419 Anxiety disorder, unspecified: Secondary | ICD-10-CM | POA: Diagnosis not present

## 2019-11-17 DIAGNOSIS — C20 Malignant neoplasm of rectum: Secondary | ICD-10-CM | POA: Diagnosis not present

## 2019-11-17 DIAGNOSIS — R112 Nausea with vomiting, unspecified: Secondary | ICD-10-CM | POA: Diagnosis not present

## 2019-11-17 DIAGNOSIS — R52 Pain, unspecified: Secondary | ICD-10-CM | POA: Diagnosis not present

## 2019-11-17 DIAGNOSIS — K219 Gastro-esophageal reflux disease without esophagitis: Secondary | ICD-10-CM | POA: Diagnosis not present

## 2019-11-17 DIAGNOSIS — G4700 Insomnia, unspecified: Secondary | ICD-10-CM | POA: Diagnosis not present

## 2019-11-21 DIAGNOSIS — C20 Malignant neoplasm of rectum: Secondary | ICD-10-CM | POA: Diagnosis not present

## 2019-11-21 DIAGNOSIS — F419 Anxiety disorder, unspecified: Secondary | ICD-10-CM | POA: Diagnosis not present

## 2019-11-21 DIAGNOSIS — R112 Nausea with vomiting, unspecified: Secondary | ICD-10-CM | POA: Diagnosis not present

## 2019-11-21 DIAGNOSIS — R52 Pain, unspecified: Secondary | ICD-10-CM | POA: Diagnosis not present

## 2019-11-21 DIAGNOSIS — K219 Gastro-esophageal reflux disease without esophagitis: Secondary | ICD-10-CM | POA: Diagnosis not present

## 2019-11-21 DIAGNOSIS — G4700 Insomnia, unspecified: Secondary | ICD-10-CM | POA: Diagnosis not present

## 2019-11-23 DIAGNOSIS — C20 Malignant neoplasm of rectum: Secondary | ICD-10-CM | POA: Diagnosis not present

## 2019-11-23 DIAGNOSIS — R112 Nausea with vomiting, unspecified: Secondary | ICD-10-CM | POA: Diagnosis not present

## 2019-11-23 DIAGNOSIS — K219 Gastro-esophageal reflux disease without esophagitis: Secondary | ICD-10-CM | POA: Diagnosis not present

## 2019-11-23 DIAGNOSIS — R52 Pain, unspecified: Secondary | ICD-10-CM | POA: Diagnosis not present

## 2019-11-23 DIAGNOSIS — G4700 Insomnia, unspecified: Secondary | ICD-10-CM | POA: Diagnosis not present

## 2019-11-23 DIAGNOSIS — F419 Anxiety disorder, unspecified: Secondary | ICD-10-CM | POA: Diagnosis not present

## 2019-11-24 DIAGNOSIS — F419 Anxiety disorder, unspecified: Secondary | ICD-10-CM | POA: Diagnosis not present

## 2019-11-24 DIAGNOSIS — G4700 Insomnia, unspecified: Secondary | ICD-10-CM | POA: Diagnosis not present

## 2019-11-24 DIAGNOSIS — R112 Nausea with vomiting, unspecified: Secondary | ICD-10-CM | POA: Diagnosis not present

## 2019-11-24 DIAGNOSIS — C20 Malignant neoplasm of rectum: Secondary | ICD-10-CM | POA: Diagnosis not present

## 2019-11-24 DIAGNOSIS — R52 Pain, unspecified: Secondary | ICD-10-CM | POA: Diagnosis not present

## 2019-11-24 DIAGNOSIS — K219 Gastro-esophageal reflux disease without esophagitis: Secondary | ICD-10-CM | POA: Diagnosis not present

## 2019-11-25 DIAGNOSIS — C20 Malignant neoplasm of rectum: Secondary | ICD-10-CM | POA: Diagnosis not present

## 2019-11-25 DIAGNOSIS — R112 Nausea with vomiting, unspecified: Secondary | ICD-10-CM | POA: Diagnosis not present

## 2019-11-25 DIAGNOSIS — F419 Anxiety disorder, unspecified: Secondary | ICD-10-CM | POA: Diagnosis not present

## 2019-11-25 DIAGNOSIS — G4700 Insomnia, unspecified: Secondary | ICD-10-CM | POA: Diagnosis not present

## 2019-11-25 DIAGNOSIS — K219 Gastro-esophageal reflux disease without esophagitis: Secondary | ICD-10-CM | POA: Diagnosis not present

## 2019-11-25 DIAGNOSIS — R52 Pain, unspecified: Secondary | ICD-10-CM | POA: Diagnosis not present

## 2019-11-28 DIAGNOSIS — G4700 Insomnia, unspecified: Secondary | ICD-10-CM | POA: Diagnosis not present

## 2019-11-28 DIAGNOSIS — K219 Gastro-esophageal reflux disease without esophagitis: Secondary | ICD-10-CM | POA: Diagnosis not present

## 2019-11-28 DIAGNOSIS — C20 Malignant neoplasm of rectum: Secondary | ICD-10-CM | POA: Diagnosis not present

## 2019-11-28 DIAGNOSIS — R52 Pain, unspecified: Secondary | ICD-10-CM | POA: Diagnosis not present

## 2019-11-28 DIAGNOSIS — F419 Anxiety disorder, unspecified: Secondary | ICD-10-CM | POA: Diagnosis not present

## 2019-11-28 DIAGNOSIS — R112 Nausea with vomiting, unspecified: Secondary | ICD-10-CM | POA: Diagnosis not present

## 2019-11-29 DIAGNOSIS — R112 Nausea with vomiting, unspecified: Secondary | ICD-10-CM | POA: Diagnosis not present

## 2019-11-29 DIAGNOSIS — K219 Gastro-esophageal reflux disease without esophagitis: Secondary | ICD-10-CM | POA: Diagnosis not present

## 2019-11-29 DIAGNOSIS — F419 Anxiety disorder, unspecified: Secondary | ICD-10-CM | POA: Diagnosis not present

## 2019-11-29 DIAGNOSIS — R52 Pain, unspecified: Secondary | ICD-10-CM | POA: Diagnosis not present

## 2019-11-29 DIAGNOSIS — G4700 Insomnia, unspecified: Secondary | ICD-10-CM | POA: Diagnosis not present

## 2019-11-29 DIAGNOSIS — C20 Malignant neoplasm of rectum: Secondary | ICD-10-CM | POA: Diagnosis not present

## 2019-11-30 DIAGNOSIS — R112 Nausea with vomiting, unspecified: Secondary | ICD-10-CM | POA: Diagnosis not present

## 2019-11-30 DIAGNOSIS — R52 Pain, unspecified: Secondary | ICD-10-CM | POA: Diagnosis not present

## 2019-11-30 DIAGNOSIS — K219 Gastro-esophageal reflux disease without esophagitis: Secondary | ICD-10-CM | POA: Diagnosis not present

## 2019-11-30 DIAGNOSIS — C20 Malignant neoplasm of rectum: Secondary | ICD-10-CM | POA: Diagnosis not present

## 2019-11-30 DIAGNOSIS — G4700 Insomnia, unspecified: Secondary | ICD-10-CM | POA: Diagnosis not present

## 2019-11-30 DIAGNOSIS — F419 Anxiety disorder, unspecified: Secondary | ICD-10-CM | POA: Diagnosis not present

## 2019-12-01 DIAGNOSIS — R52 Pain, unspecified: Secondary | ICD-10-CM | POA: Diagnosis not present

## 2019-12-01 DIAGNOSIS — G4700 Insomnia, unspecified: Secondary | ICD-10-CM | POA: Diagnosis not present

## 2019-12-01 DIAGNOSIS — F419 Anxiety disorder, unspecified: Secondary | ICD-10-CM | POA: Diagnosis not present

## 2019-12-01 DIAGNOSIS — C20 Malignant neoplasm of rectum: Secondary | ICD-10-CM | POA: Diagnosis not present

## 2019-12-01 DIAGNOSIS — R112 Nausea with vomiting, unspecified: Secondary | ICD-10-CM | POA: Diagnosis not present

## 2019-12-01 DIAGNOSIS — K219 Gastro-esophageal reflux disease without esophagitis: Secondary | ICD-10-CM | POA: Diagnosis not present

## 2019-12-02 DIAGNOSIS — C20 Malignant neoplasm of rectum: Secondary | ICD-10-CM | POA: Diagnosis not present

## 2019-12-02 DIAGNOSIS — G4700 Insomnia, unspecified: Secondary | ICD-10-CM | POA: Diagnosis not present

## 2019-12-02 DIAGNOSIS — K219 Gastro-esophageal reflux disease without esophagitis: Secondary | ICD-10-CM | POA: Diagnosis not present

## 2019-12-02 DIAGNOSIS — F419 Anxiety disorder, unspecified: Secondary | ICD-10-CM | POA: Diagnosis not present

## 2019-12-02 DIAGNOSIS — R52 Pain, unspecified: Secondary | ICD-10-CM | POA: Diagnosis not present

## 2019-12-02 DIAGNOSIS — R112 Nausea with vomiting, unspecified: Secondary | ICD-10-CM | POA: Diagnosis not present

## 2019-12-03 DIAGNOSIS — C20 Malignant neoplasm of rectum: Secondary | ICD-10-CM | POA: Diagnosis not present

## 2019-12-03 DIAGNOSIS — G4700 Insomnia, unspecified: Secondary | ICD-10-CM | POA: Diagnosis not present

## 2019-12-03 DIAGNOSIS — R52 Pain, unspecified: Secondary | ICD-10-CM | POA: Diagnosis not present

## 2019-12-03 DIAGNOSIS — R112 Nausea with vomiting, unspecified: Secondary | ICD-10-CM | POA: Diagnosis not present

## 2019-12-03 DIAGNOSIS — K219 Gastro-esophageal reflux disease without esophagitis: Secondary | ICD-10-CM | POA: Diagnosis not present

## 2019-12-03 DIAGNOSIS — F419 Anxiety disorder, unspecified: Secondary | ICD-10-CM | POA: Diagnosis not present

## 2019-12-04 DIAGNOSIS — E119 Type 2 diabetes mellitus without complications: Secondary | ICD-10-CM | POA: Diagnosis not present

## 2019-12-04 DIAGNOSIS — F419 Anxiety disorder, unspecified: Secondary | ICD-10-CM | POA: Diagnosis not present

## 2019-12-04 DIAGNOSIS — Z86718 Personal history of other venous thrombosis and embolism: Secondary | ICD-10-CM | POA: Diagnosis not present

## 2019-12-04 DIAGNOSIS — R112 Nausea with vomiting, unspecified: Secondary | ICD-10-CM | POA: Diagnosis not present

## 2019-12-04 DIAGNOSIS — G4700 Insomnia, unspecified: Secondary | ICD-10-CM | POA: Diagnosis not present

## 2019-12-04 DIAGNOSIS — R52 Pain, unspecified: Secondary | ICD-10-CM | POA: Diagnosis not present

## 2019-12-04 DIAGNOSIS — K219 Gastro-esophageal reflux disease without esophagitis: Secondary | ICD-10-CM | POA: Diagnosis not present

## 2019-12-04 DIAGNOSIS — C20 Malignant neoplasm of rectum: Secondary | ICD-10-CM | POA: Diagnosis not present

## 2019-12-05 DIAGNOSIS — R52 Pain, unspecified: Secondary | ICD-10-CM | POA: Diagnosis not present

## 2019-12-05 DIAGNOSIS — C20 Malignant neoplasm of rectum: Secondary | ICD-10-CM | POA: Diagnosis not present

## 2019-12-05 DIAGNOSIS — G4700 Insomnia, unspecified: Secondary | ICD-10-CM | POA: Diagnosis not present

## 2019-12-05 DIAGNOSIS — K219 Gastro-esophageal reflux disease without esophagitis: Secondary | ICD-10-CM | POA: Diagnosis not present

## 2019-12-05 DIAGNOSIS — F419 Anxiety disorder, unspecified: Secondary | ICD-10-CM | POA: Diagnosis not present

## 2019-12-05 DIAGNOSIS — R112 Nausea with vomiting, unspecified: Secondary | ICD-10-CM | POA: Diagnosis not present

## 2019-12-07 DIAGNOSIS — C20 Malignant neoplasm of rectum: Secondary | ICD-10-CM | POA: Diagnosis not present

## 2019-12-07 DIAGNOSIS — F419 Anxiety disorder, unspecified: Secondary | ICD-10-CM | POA: Diagnosis not present

## 2019-12-07 DIAGNOSIS — R112 Nausea with vomiting, unspecified: Secondary | ICD-10-CM | POA: Diagnosis not present

## 2019-12-07 DIAGNOSIS — R52 Pain, unspecified: Secondary | ICD-10-CM | POA: Diagnosis not present

## 2019-12-07 DIAGNOSIS — G4700 Insomnia, unspecified: Secondary | ICD-10-CM | POA: Diagnosis not present

## 2019-12-07 DIAGNOSIS — K219 Gastro-esophageal reflux disease without esophagitis: Secondary | ICD-10-CM | POA: Diagnosis not present

## 2019-12-09 DIAGNOSIS — G4700 Insomnia, unspecified: Secondary | ICD-10-CM | POA: Diagnosis not present

## 2019-12-09 DIAGNOSIS — R112 Nausea with vomiting, unspecified: Secondary | ICD-10-CM | POA: Diagnosis not present

## 2019-12-09 DIAGNOSIS — C20 Malignant neoplasm of rectum: Secondary | ICD-10-CM | POA: Diagnosis not present

## 2019-12-09 DIAGNOSIS — K219 Gastro-esophageal reflux disease without esophagitis: Secondary | ICD-10-CM | POA: Diagnosis not present

## 2019-12-09 DIAGNOSIS — F419 Anxiety disorder, unspecified: Secondary | ICD-10-CM | POA: Diagnosis not present

## 2019-12-09 DIAGNOSIS — R52 Pain, unspecified: Secondary | ICD-10-CM | POA: Diagnosis not present

## 2019-12-12 DIAGNOSIS — G4700 Insomnia, unspecified: Secondary | ICD-10-CM | POA: Diagnosis not present

## 2019-12-12 DIAGNOSIS — F419 Anxiety disorder, unspecified: Secondary | ICD-10-CM | POA: Diagnosis not present

## 2019-12-12 DIAGNOSIS — K219 Gastro-esophageal reflux disease without esophagitis: Secondary | ICD-10-CM | POA: Diagnosis not present

## 2019-12-12 DIAGNOSIS — R52 Pain, unspecified: Secondary | ICD-10-CM | POA: Diagnosis not present

## 2019-12-12 DIAGNOSIS — C20 Malignant neoplasm of rectum: Secondary | ICD-10-CM | POA: Diagnosis not present

## 2019-12-12 DIAGNOSIS — R112 Nausea with vomiting, unspecified: Secondary | ICD-10-CM | POA: Diagnosis not present

## 2019-12-14 DIAGNOSIS — K219 Gastro-esophageal reflux disease without esophagitis: Secondary | ICD-10-CM | POA: Diagnosis not present

## 2019-12-14 DIAGNOSIS — R52 Pain, unspecified: Secondary | ICD-10-CM | POA: Diagnosis not present

## 2019-12-14 DIAGNOSIS — C20 Malignant neoplasm of rectum: Secondary | ICD-10-CM | POA: Diagnosis not present

## 2019-12-14 DIAGNOSIS — R112 Nausea with vomiting, unspecified: Secondary | ICD-10-CM | POA: Diagnosis not present

## 2019-12-14 DIAGNOSIS — G4700 Insomnia, unspecified: Secondary | ICD-10-CM | POA: Diagnosis not present

## 2019-12-14 DIAGNOSIS — F419 Anxiety disorder, unspecified: Secondary | ICD-10-CM | POA: Diagnosis not present

## 2019-12-15 DIAGNOSIS — C20 Malignant neoplasm of rectum: Secondary | ICD-10-CM | POA: Diagnosis not present

## 2019-12-15 DIAGNOSIS — R112 Nausea with vomiting, unspecified: Secondary | ICD-10-CM | POA: Diagnosis not present

## 2019-12-15 DIAGNOSIS — R52 Pain, unspecified: Secondary | ICD-10-CM | POA: Diagnosis not present

## 2019-12-15 DIAGNOSIS — G4700 Insomnia, unspecified: Secondary | ICD-10-CM | POA: Diagnosis not present

## 2019-12-15 DIAGNOSIS — F419 Anxiety disorder, unspecified: Secondary | ICD-10-CM | POA: Diagnosis not present

## 2019-12-15 DIAGNOSIS — K219 Gastro-esophageal reflux disease without esophagitis: Secondary | ICD-10-CM | POA: Diagnosis not present

## 2019-12-16 DIAGNOSIS — R112 Nausea with vomiting, unspecified: Secondary | ICD-10-CM | POA: Diagnosis not present

## 2019-12-16 DIAGNOSIS — C20 Malignant neoplasm of rectum: Secondary | ICD-10-CM | POA: Diagnosis not present

## 2019-12-16 DIAGNOSIS — R52 Pain, unspecified: Secondary | ICD-10-CM | POA: Diagnosis not present

## 2019-12-16 DIAGNOSIS — K219 Gastro-esophageal reflux disease without esophagitis: Secondary | ICD-10-CM | POA: Diagnosis not present

## 2019-12-16 DIAGNOSIS — G4700 Insomnia, unspecified: Secondary | ICD-10-CM | POA: Diagnosis not present

## 2019-12-16 DIAGNOSIS — F419 Anxiety disorder, unspecified: Secondary | ICD-10-CM | POA: Diagnosis not present

## 2019-12-18 DIAGNOSIS — K219 Gastro-esophageal reflux disease without esophagitis: Secondary | ICD-10-CM | POA: Diagnosis not present

## 2019-12-18 DIAGNOSIS — G4700 Insomnia, unspecified: Secondary | ICD-10-CM | POA: Diagnosis not present

## 2019-12-18 DIAGNOSIS — C20 Malignant neoplasm of rectum: Secondary | ICD-10-CM | POA: Diagnosis not present

## 2019-12-18 DIAGNOSIS — F419 Anxiety disorder, unspecified: Secondary | ICD-10-CM | POA: Diagnosis not present

## 2019-12-18 DIAGNOSIS — R55 Syncope and collapse: Secondary | ICD-10-CM | POA: Diagnosis not present

## 2019-12-18 DIAGNOSIS — R112 Nausea with vomiting, unspecified: Secondary | ICD-10-CM | POA: Diagnosis not present

## 2019-12-18 DIAGNOSIS — R0689 Other abnormalities of breathing: Secondary | ICD-10-CM | POA: Diagnosis not present

## 2019-12-18 DIAGNOSIS — R52 Pain, unspecified: Secondary | ICD-10-CM | POA: Diagnosis not present

## 2019-12-19 DIAGNOSIS — R52 Pain, unspecified: Secondary | ICD-10-CM | POA: Diagnosis not present

## 2019-12-19 DIAGNOSIS — G4700 Insomnia, unspecified: Secondary | ICD-10-CM | POA: Diagnosis not present

## 2019-12-19 DIAGNOSIS — C20 Malignant neoplasm of rectum: Secondary | ICD-10-CM | POA: Diagnosis not present

## 2019-12-19 DIAGNOSIS — F419 Anxiety disorder, unspecified: Secondary | ICD-10-CM | POA: Diagnosis not present

## 2019-12-19 DIAGNOSIS — R112 Nausea with vomiting, unspecified: Secondary | ICD-10-CM | POA: Diagnosis not present

## 2019-12-19 DIAGNOSIS — K219 Gastro-esophageal reflux disease without esophagitis: Secondary | ICD-10-CM | POA: Diagnosis not present

## 2019-12-20 DIAGNOSIS — C20 Malignant neoplasm of rectum: Secondary | ICD-10-CM | POA: Diagnosis not present

## 2019-12-20 DIAGNOSIS — F419 Anxiety disorder, unspecified: Secondary | ICD-10-CM | POA: Diagnosis not present

## 2019-12-20 DIAGNOSIS — R112 Nausea with vomiting, unspecified: Secondary | ICD-10-CM | POA: Diagnosis not present

## 2019-12-20 DIAGNOSIS — G4700 Insomnia, unspecified: Secondary | ICD-10-CM | POA: Diagnosis not present

## 2019-12-20 DIAGNOSIS — R52 Pain, unspecified: Secondary | ICD-10-CM | POA: Diagnosis not present

## 2019-12-20 DIAGNOSIS — K219 Gastro-esophageal reflux disease without esophagitis: Secondary | ICD-10-CM | POA: Diagnosis not present

## 2020-01-04 DEATH — deceased

## 2020-02-05 IMAGING — DX CHEST - 2 VIEW
3 series · 3 of 3 positions shown · non-contrast
Comparison: 09/25/2018

CLINICAL DATA: Suspected sepsis. Patient started on a new
chemotherapy drive.

EXAM:
CHEST - 2 VIEW

[chest lat (1 of 2)]
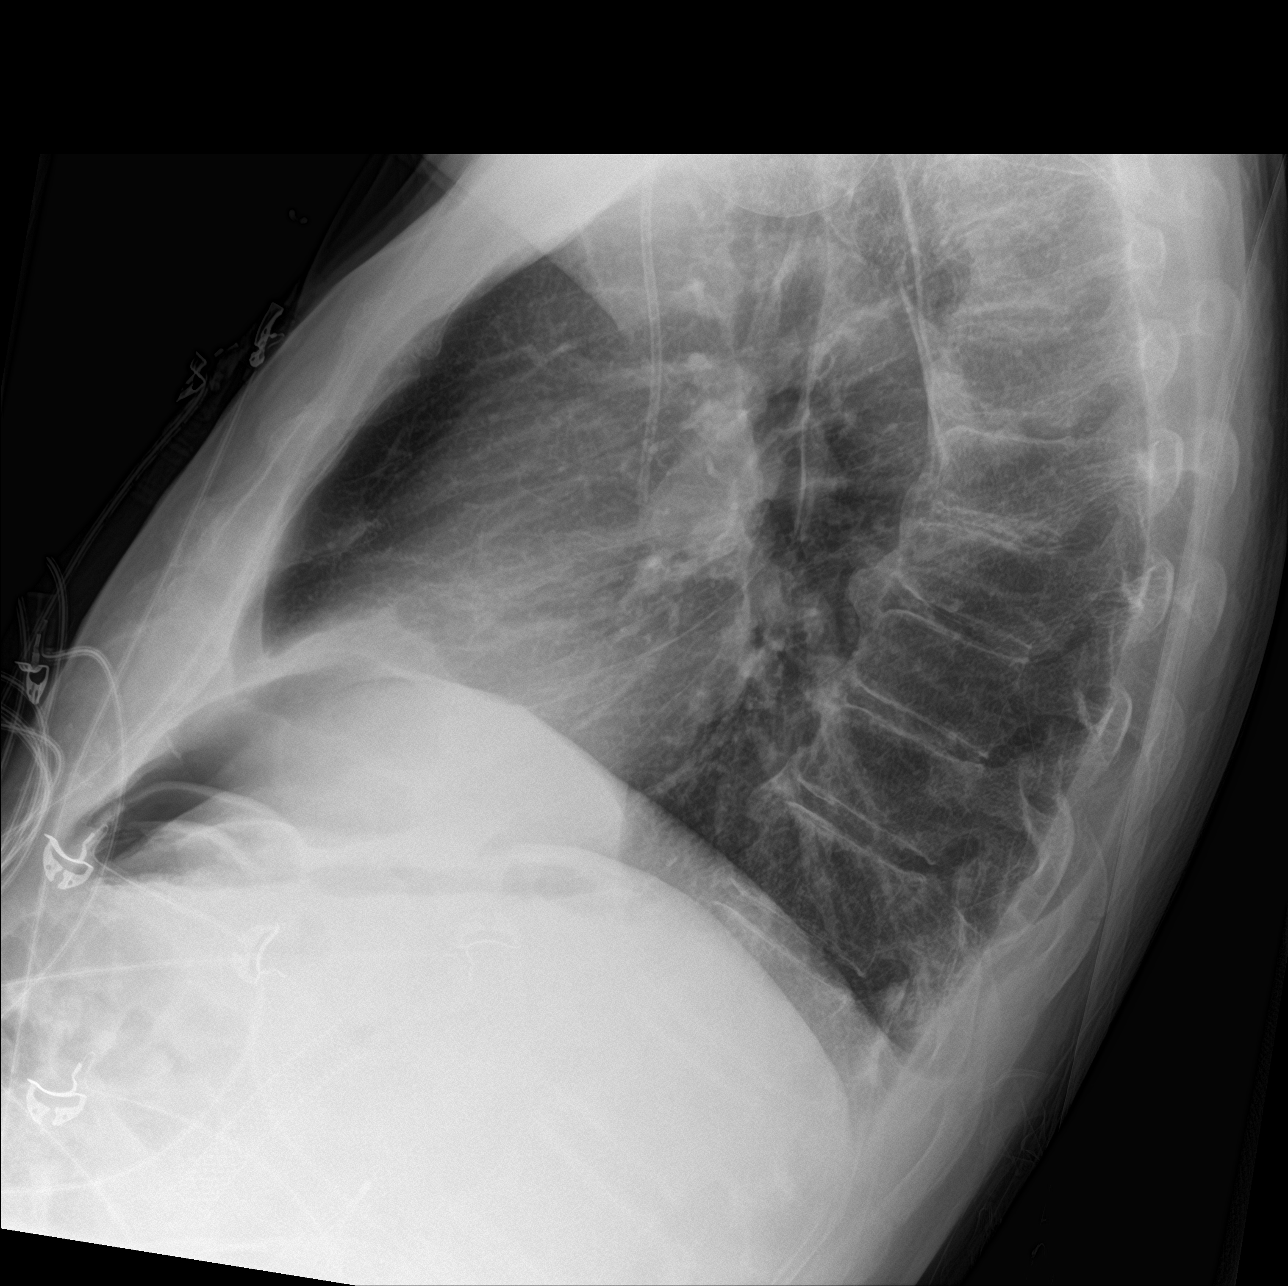

[chest ap]
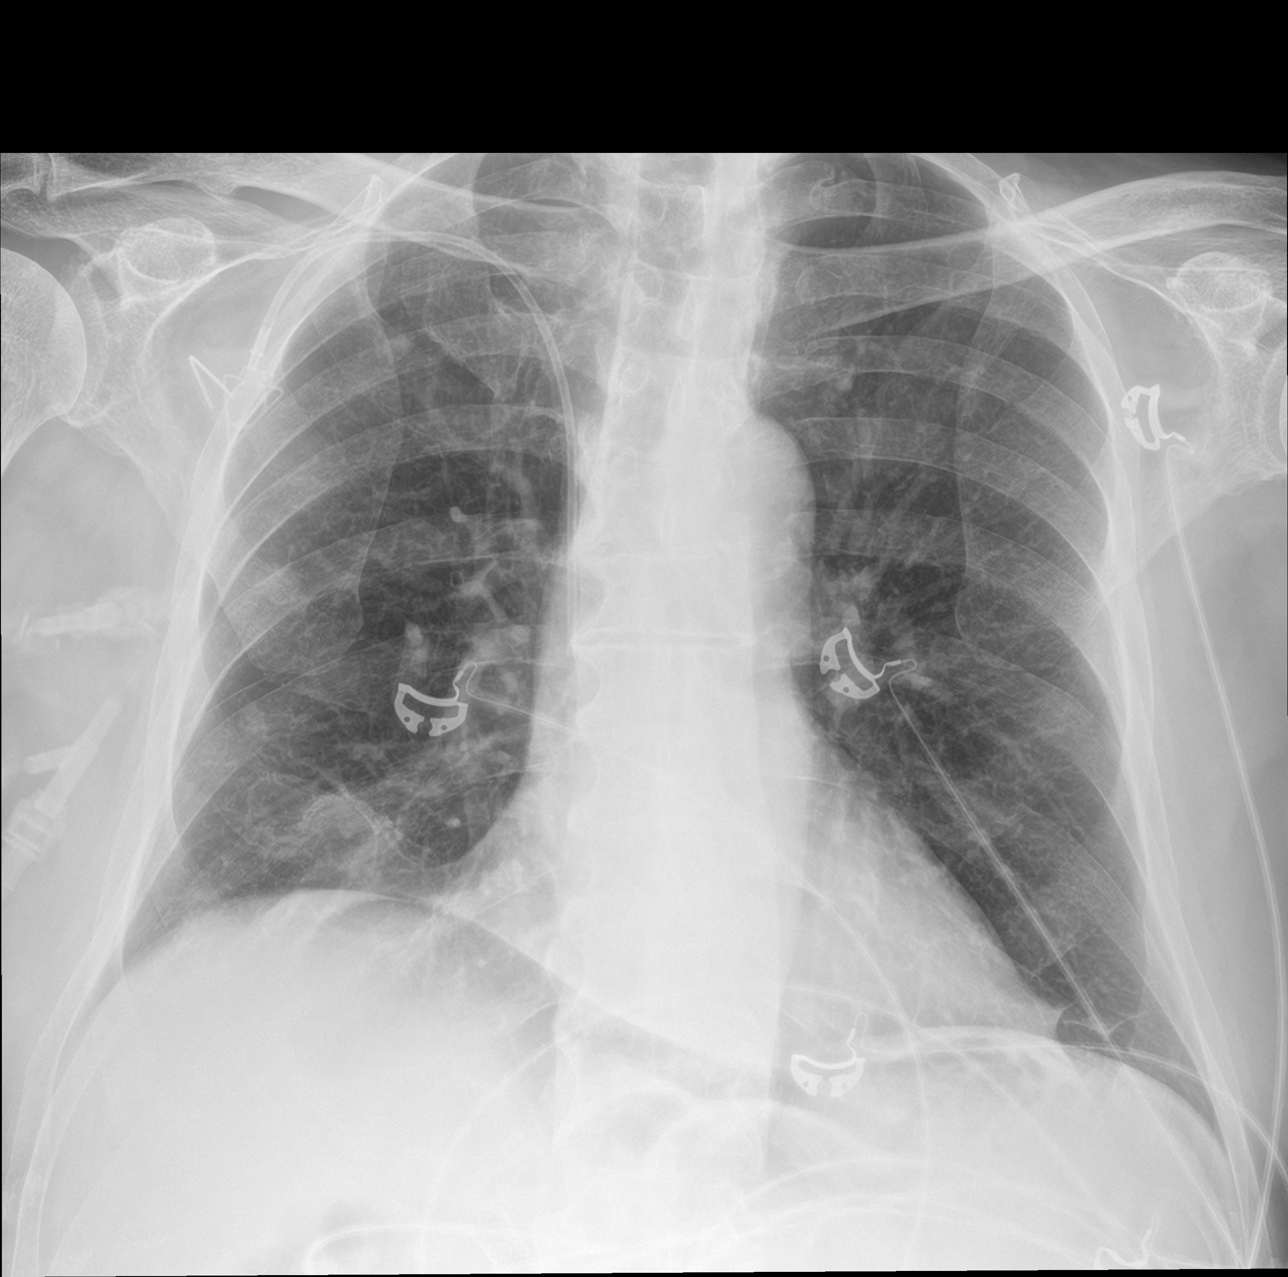

[chest lat (2 of 2)]
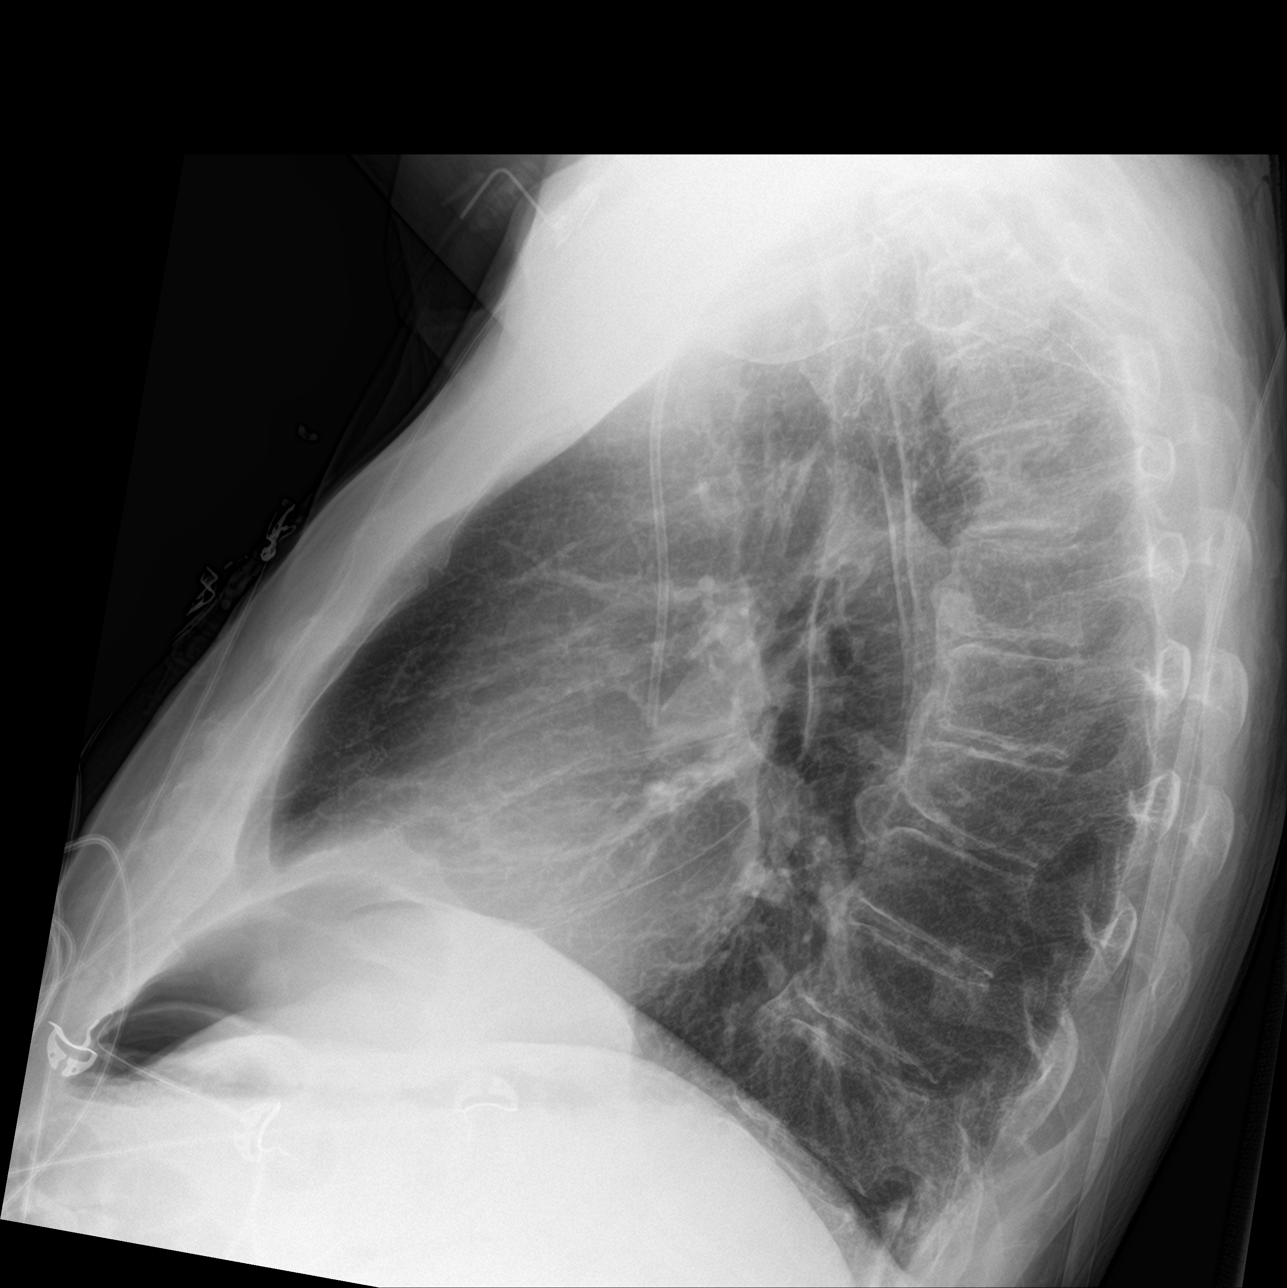

[3 of 3 positions shown; findings below may reference images not displayed]

FINDINGS: Cardiac silhouette is normal in size. No mediastinal or hilar masses
or evidence of adenopathy.

Pulmonary anastomosis staples with hazy associated opacity is noted
at the right lung base, stable from prior studies. There are right
lung nodules. Lungs otherwise clear.

No pleural effusion or pneumothorax.

Right anterior chest wall Port-A-Cath is stable.

Skeletal structures are intact.
IMPRESSION: No acute cardiopulmonary disease.

## 2020-06-13 IMAGING — CT CT ABD-PELV W/ CM
2 of 7 series · 13 of 36 positions shown, 16 images · IV contrast (Omnipaque or Isovue)
Comparison: CT 09/22/2018, 07/30/2018, 05/28/2018

CLINICAL DATA: Nausea vomiting diarrhea history of rectal cancer
with metastatic disease

EXAM:
CT CHEST, ABDOMEN, AND PELVIS WITH CONTRAST
TECHNIQUE: Multidetector CT imaging of the chest, abdomen and pelvis was
performed following the standard protocol during bolus
administration of intravenous contrast.
CONTRAST:  100mL OMNIPAQUE IOHEXOL 300 MG/ML  SOLN

[Series 4: coronals · coronal · 1.04mm/px · 3 of 161 slices shown]
[im 33/161  lung]
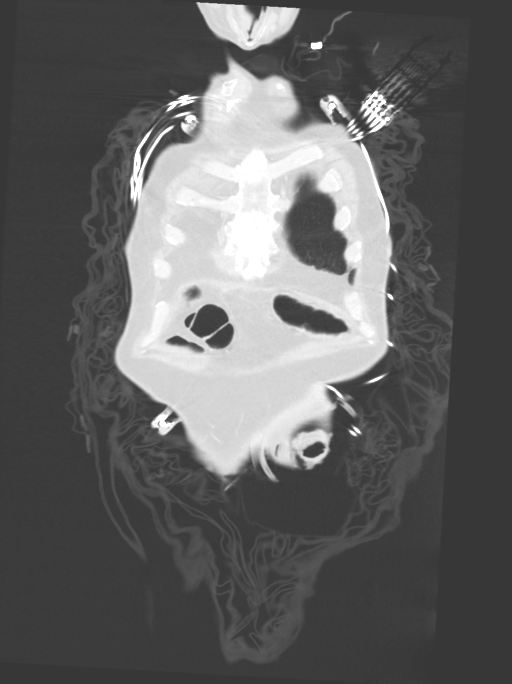
[im 65/161  lung]
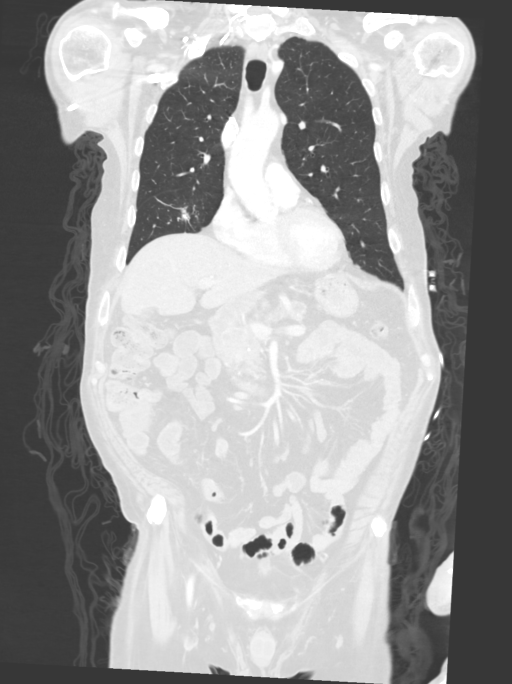
[im 97/161  lung]
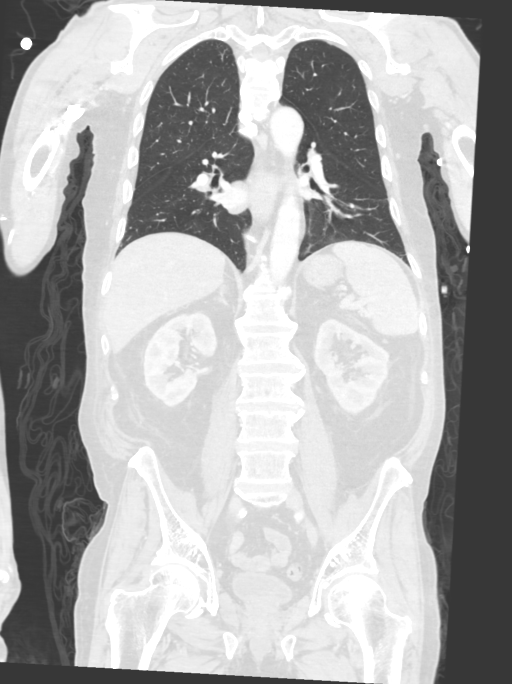

[Series 6: thins · axial · 0.82mm/px · z∈[+904,+1432]mm · 10 of 923 slices shown, 13 images]
[im 84/923  mediastinal]
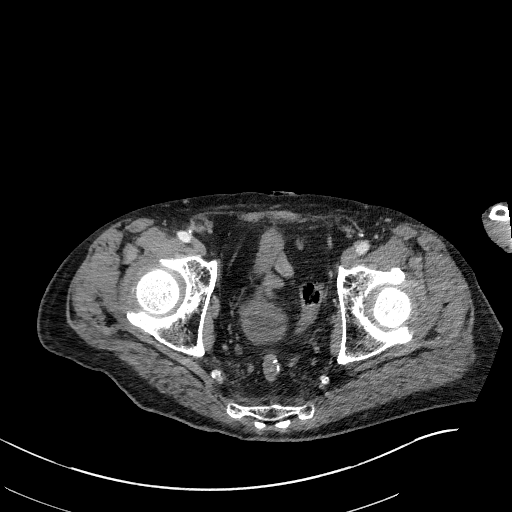
[im 84/923  lung]
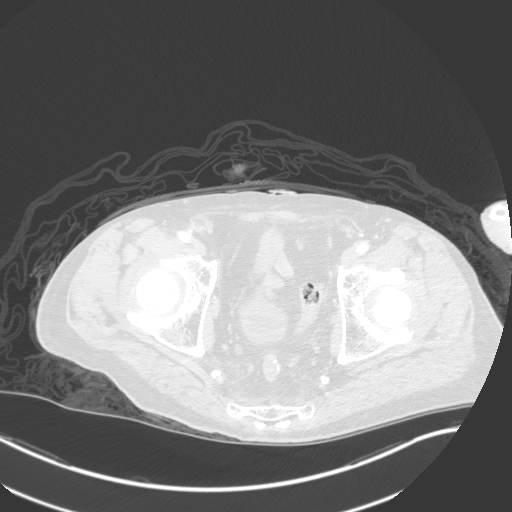
[im 168/923  lung]
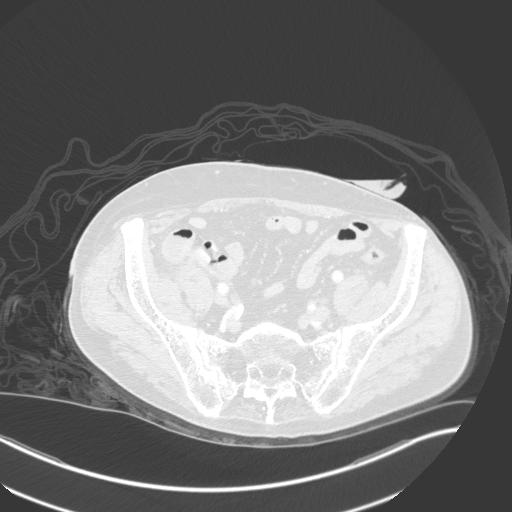
[im 252/923  lung]
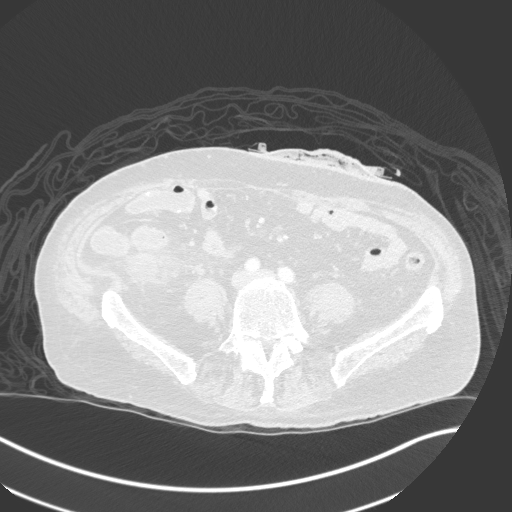
[im 336/923  lung]
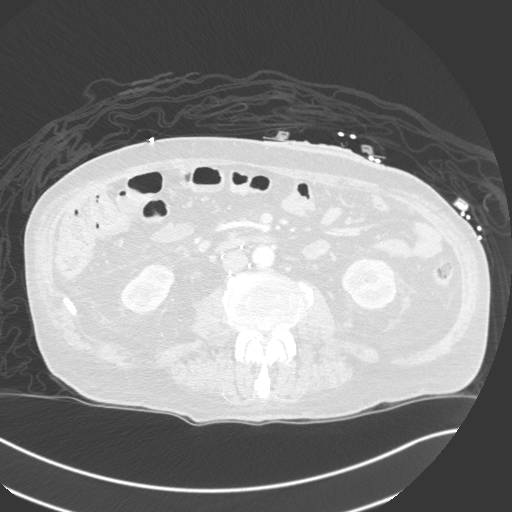
[im 420/923  mediastinal]
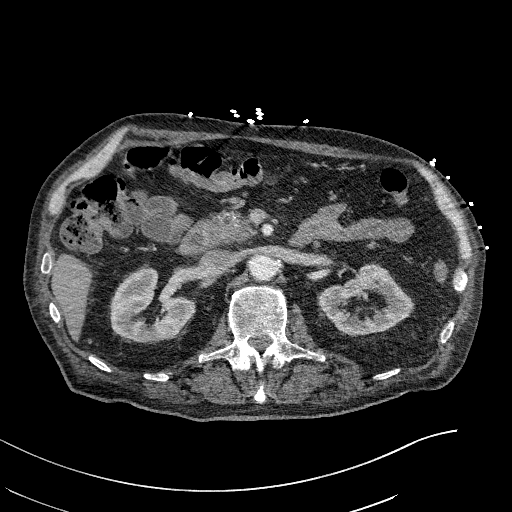
[im 420/923  lung]
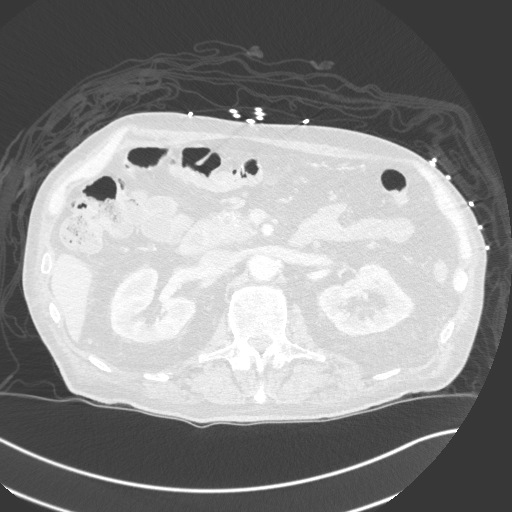
[im 503/923  lung]
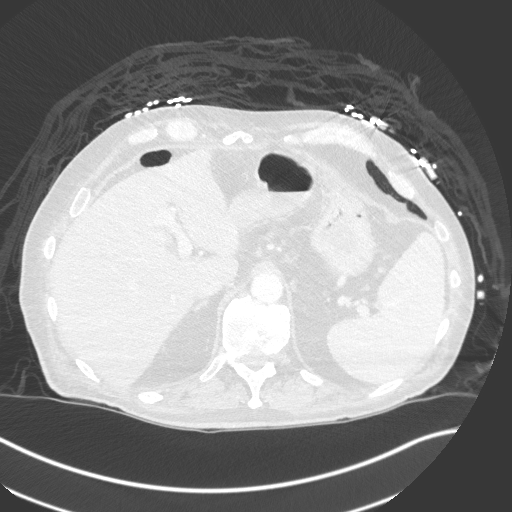
[im 587/923  lung]
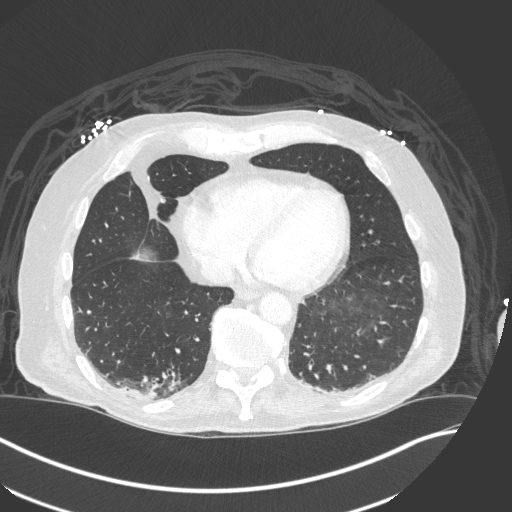
[im 671/923  lung]
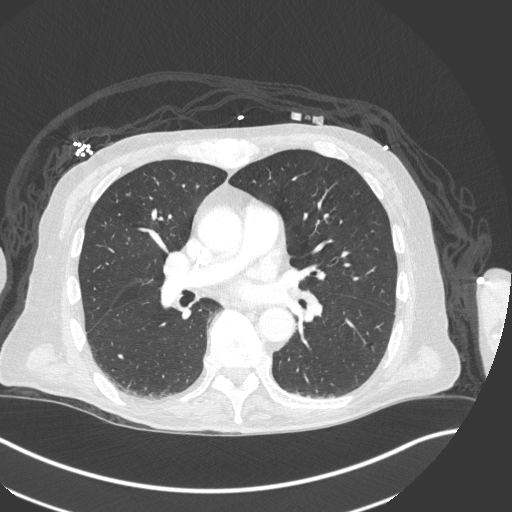
[im 755/923  mediastinal]
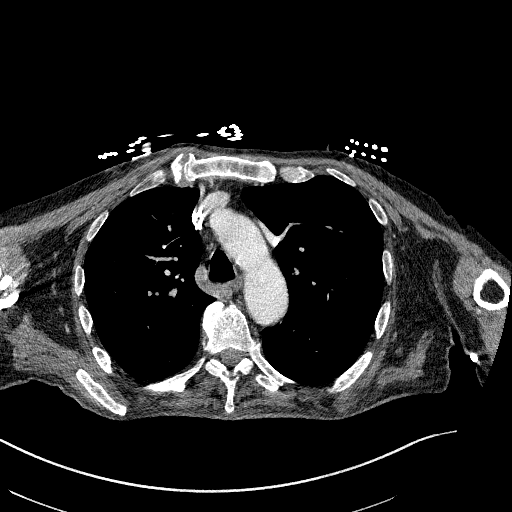
[im 755/923  lung]
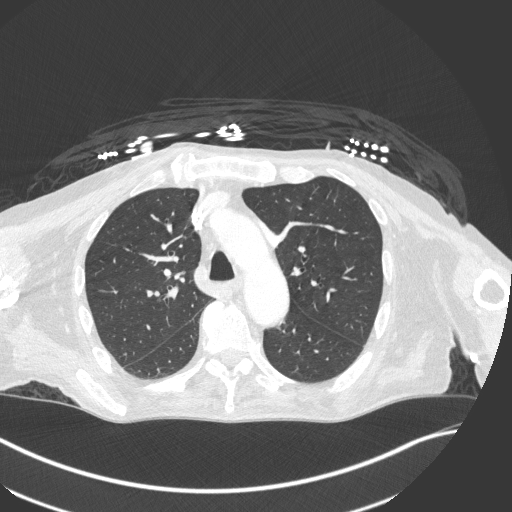
[im 839/923  lung]
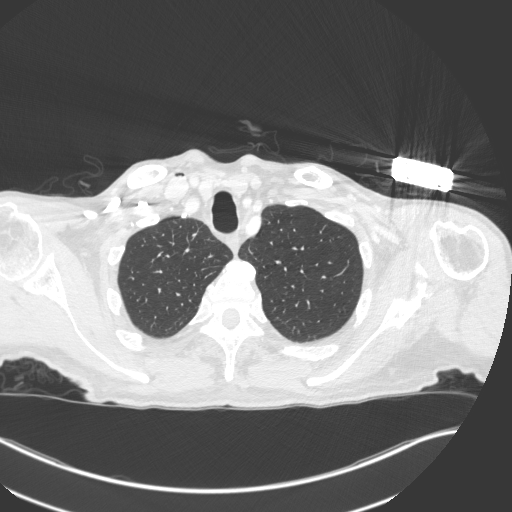

[13 of 36 positions shown; findings below may reference images not displayed]

FINDINGS: CT CHEST FINDINGS

Cardiovascular: Nonaneurysmal aorta. Mild aortic atherosclerosis.
Coronary vascular calcification. Normal heart size. No pericardial
effusion. Right-sided central venous port with tip at the distal
SVC.

Mediastinum/Nodes: Midline trachea. No thyroid mass. No increasing
mediastinal adenopathy. Esophagus within normal limits.

Lungs/Pleura: No acute consolidation or pleural effusion. Stable 6
mm anterior right upper lobe pulmonary nodule, series 3, image
number 106, previously 6 mm. Posterior right upper lobe pulmonary
nodule, series 3, image number 41 measures 11 mm, previously 11 mm.
Posterior right upper lobe 11 mm pulmonary nodule, series 3, image
number 75, previously 13 mm. No new pulmonary nodule. Right middle
lobe postsurgical changes

Musculoskeletal: Stable mixed sclerotic and lucent lesion in the
lower sternum. Degenerative changes of the spine.

CT ABDOMEN PELVIS FINDINGS

Hepatobiliary: Contracted gallbladder without calcified stone. No
focal hepatic abnormality. No ductal dilatation

Pancreas: Diffusely atrophic. Diffuse pancreatic duct dilatation
with truncation at the pancreatic head. Stable 2.1 cm mass at the
head of the pancreas.

Spleen: Normal in size without focal abnormality.

Adrenals/Urinary Tract: Left adrenal calcification. No adrenal mass.
Kidneys show no hydronephrosis. Stable subcentimeter exophytic
slightly dense lesion off the lower pole left kidney. The bladder is
slightly thick walled.

Stomach/Bowel: The stomach is nonenlarged. No dilated small bowel.
Status post partial proctocolectomy decreased rectal wall thickening
compared to most recent prior. Interval left lower quadrant loop
colostomy.

Abnormal appendix. Small appendicoliths. Considerable
periappendiceal inflammatory change. Appendix tip dilated up to
cm. No extraluminal gas. Possible tiny 7 mm fluid collection
adjacent to tip of appendix.

Vascular/Lymphatic: IVC filter with tip just below the renal vein
confluence. Similar protrusion struts beyond the IVC lumen.
Nonaneurysmal aorta with mild aortic atherosclerosis. Subcentimeter
lymph nodes in the right lower quadrant could be reactive.

Reproductive: Prostate calcification

Other: Mild presacral soft tissue stranding. No free fluid. No
significant pelvic effusion

Musculoskeletal: No acute or suspicious osseous abnormality
IMPRESSION: 1. Findings consistent with acute appendicitis. No definitive
extraluminal gas though considerable inflammatory change about the
appendix and possible tiny 7 mm tiny fluid collection adjacent to
the tip of the appendix.

Appendix: Location: Right lower quadrant

Diameter: Up to 21 mm

Appendicolith: Positive for stone near the tip

Mucosal hyper-enhancement: Negative

Extraluminal gas: Negative

Periappendiceal collection: Possible tiny 7 mm fluid collection
adjacent to the tip of the appendix.

2. Status post partial proctocolectomy with interval left lower
quadrant loop colostomy. Decreased rectal wall thickening.

3.  Stable right upper lobe pulmonary nodules.

4. Stable 2 cm mass at the head of pancreas with atrophy ductal
obstruction distal to this

5. Stable mixed lucent and sclerotic lesion involving the lower
sternum
